# Patient Record
Sex: Female | Born: 1960 | Race: Black or African American | Hispanic: No | Smoking: Current every day smoker
Health system: Southern US, Community
[De-identification: ages and names within clinical notes are randomized; demographics above are authoritative.]

## PROBLEM LIST (undated history)

## (undated) DIAGNOSIS — M359 Systemic involvement of connective tissue, unspecified: Secondary | ICD-10-CM

## (undated) DIAGNOSIS — C801 Malignant (primary) neoplasm, unspecified: Secondary | ICD-10-CM

## (undated) DIAGNOSIS — G40909 Epilepsy, unspecified, not intractable, without status epilepticus: Secondary | ICD-10-CM

## (undated) DIAGNOSIS — I10 Essential (primary) hypertension: Secondary | ICD-10-CM

## (undated) DIAGNOSIS — M329 Systemic lupus erythematosus, unspecified: Secondary | ICD-10-CM

## (undated) DIAGNOSIS — G2581 Restless legs syndrome: Secondary | ICD-10-CM

## (undated) DIAGNOSIS — E785 Hyperlipidemia, unspecified: Secondary | ICD-10-CM

## (undated) DIAGNOSIS — C259 Malignant neoplasm of pancreas, unspecified: Secondary | ICD-10-CM

## (undated) DIAGNOSIS — G9341 Metabolic encephalopathy: Secondary | ICD-10-CM

## (undated) DIAGNOSIS — Z933 Colostomy status: Secondary | ICD-10-CM

## (undated) DIAGNOSIS — L0291 Cutaneous abscess, unspecified: Secondary | ICD-10-CM

## (undated) DIAGNOSIS — IMO0002 Reserved for concepts with insufficient information to code with codable children: Secondary | ICD-10-CM

## (undated) DIAGNOSIS — E119 Type 2 diabetes mellitus without complications: Secondary | ICD-10-CM

## (undated) HISTORY — DX: Metabolic encephalopathy: G93.41

## (undated) HISTORY — PX: OTHER SURGICAL HISTORY: SHX169

## (undated) HISTORY — PX: PANCREATECTOMY: SHX1019

## (undated) HISTORY — DX: Cutaneous abscess, unspecified: L02.91

---

## 2004-04-16 ENCOUNTER — Emergency Department: Payer: Self-pay | Admitting: Emergency Medicine

## 2004-04-18 ENCOUNTER — Emergency Department: Payer: Self-pay | Admitting: Unknown Physician Specialty

## 2004-12-18 ENCOUNTER — Emergency Department: Payer: Self-pay | Admitting: Emergency Medicine

## 2004-12-22 ENCOUNTER — Inpatient Hospital Stay: Payer: Self-pay

## 2005-01-29 ENCOUNTER — Emergency Department: Payer: Self-pay | Admitting: Emergency Medicine

## 2005-01-29 ENCOUNTER — Other Ambulatory Visit: Payer: Self-pay

## 2005-07-19 ENCOUNTER — Emergency Department: Payer: Self-pay | Admitting: Emergency Medicine

## 2005-08-03 ENCOUNTER — Ambulatory Visit: Payer: Self-pay | Admitting: Internal Medicine

## 2005-08-24 ENCOUNTER — Emergency Department: Payer: Self-pay | Admitting: Emergency Medicine

## 2005-09-04 ENCOUNTER — Ambulatory Visit: Payer: Self-pay

## 2005-11-03 ENCOUNTER — Emergency Department: Payer: Self-pay | Admitting: Emergency Medicine

## 2005-12-18 ENCOUNTER — Other Ambulatory Visit: Payer: Self-pay

## 2005-12-18 ENCOUNTER — Emergency Department: Payer: Self-pay | Admitting: Emergency Medicine

## 2005-12-23 ENCOUNTER — Emergency Department: Payer: Self-pay | Admitting: Emergency Medicine

## 2006-02-13 ENCOUNTER — Inpatient Hospital Stay: Payer: Self-pay | Admitting: Internal Medicine

## 2006-02-15 ENCOUNTER — Other Ambulatory Visit: Payer: Self-pay

## 2006-07-23 ENCOUNTER — Ambulatory Visit: Payer: Self-pay | Admitting: Internal Medicine

## 2008-07-14 ENCOUNTER — Emergency Department: Payer: Self-pay | Admitting: Emergency Medicine

## 2009-01-25 ENCOUNTER — Inpatient Hospital Stay: Payer: Self-pay | Admitting: Specialist

## 2009-04-25 ENCOUNTER — Inpatient Hospital Stay: Payer: Self-pay | Admitting: Internal Medicine

## 2009-10-13 DIAGNOSIS — Z8639 Personal history of other endocrine, nutritional and metabolic disease: Secondary | ICD-10-CM | POA: Insufficient documentation

## 2010-04-01 ENCOUNTER — Inpatient Hospital Stay: Payer: Self-pay | Admitting: *Deleted

## 2010-05-24 ENCOUNTER — Emergency Department: Payer: Self-pay | Admitting: Unknown Physician Specialty

## 2010-07-24 ENCOUNTER — Observation Stay: Payer: Self-pay | Admitting: Surgery

## 2010-08-19 DIAGNOSIS — E1159 Type 2 diabetes mellitus with other circulatory complications: Secondary | ICD-10-CM | POA: Insufficient documentation

## 2010-08-19 DIAGNOSIS — Z8719 Personal history of other diseases of the digestive system: Secondary | ICD-10-CM | POA: Insufficient documentation

## 2010-08-19 DIAGNOSIS — G2581 Restless legs syndrome: Secondary | ICD-10-CM | POA: Insufficient documentation

## 2010-08-19 DIAGNOSIS — I10 Essential (primary) hypertension: Secondary | ICD-10-CM | POA: Insufficient documentation

## 2010-08-19 DIAGNOSIS — R109 Unspecified abdominal pain: Secondary | ICD-10-CM | POA: Insufficient documentation

## 2010-08-19 DIAGNOSIS — L930 Discoid lupus erythematosus: Secondary | ICD-10-CM | POA: Insufficient documentation

## 2010-08-19 DIAGNOSIS — F339 Major depressive disorder, recurrent, unspecified: Secondary | ICD-10-CM | POA: Insufficient documentation

## 2010-08-19 DIAGNOSIS — G8929 Other chronic pain: Secondary | ICD-10-CM | POA: Insufficient documentation

## 2010-11-14 DIAGNOSIS — N611 Abscess of the breast and nipple: Secondary | ICD-10-CM | POA: Insufficient documentation

## 2011-01-01 DIAGNOSIS — E139 Other specified diabetes mellitus without complications: Secondary | ICD-10-CM | POA: Insufficient documentation

## 2011-01-01 DIAGNOSIS — Z90410 Acquired total absence of pancreas: Secondary | ICD-10-CM

## 2011-01-01 DIAGNOSIS — E891 Postprocedural hypoinsulinemia: Secondary | ICD-10-CM

## 2011-04-04 ENCOUNTER — Emergency Department: Payer: Self-pay

## 2011-04-04 LAB — COMPREHENSIVE METABOLIC PANEL
Albumin: 4.5 g/dL (ref 3.4–5.0)
Alkaline Phosphatase: 97 U/L (ref 50–136)
Calcium, Total: 10.4 mg/dL — ABNORMAL HIGH (ref 8.5–10.1)
Chloride: 99 mmol/L (ref 98–107)
Co2: 26 mmol/L (ref 21–32)
EGFR (Non-African Amer.): 51 — ABNORMAL LOW
Glucose: 284 mg/dL — ABNORMAL HIGH (ref 65–99)
Osmolality: 289 (ref 275–301)
SGOT(AST): 35 U/L (ref 15–37)
SGPT (ALT): 33 U/L
Sodium: 138 mmol/L (ref 136–145)

## 2011-04-04 LAB — CBC
HCT: 47.2 % — ABNORMAL HIGH (ref 35.0–47.0)
HGB: 15.8 g/dL (ref 12.0–16.0)
MCHC: 33.4 g/dL (ref 32.0–36.0)
RBC: 4.84 10*6/uL (ref 3.80–5.20)
WBC: 7.4 10*3/uL (ref 3.6–11.0)

## 2011-04-04 LAB — URINALYSIS, COMPLETE
Bacteria: NONE SEEN
Bilirubin,UR: NEGATIVE
Glucose,UR: 50 mg/dL (ref 0–75)
Hyaline Cast: 3
Ketone: NEGATIVE
RBC,UR: 2 /HPF (ref 0–5)
Specific Gravity: >1.06 (ref 1.003–1.030)
Squamous Epithelial: <1
WBC UR: 1 /HPF (ref 0–5)

## 2011-04-04 LAB — LIPASE, BLOOD: Lipase: 452 U/L — ABNORMAL HIGH (ref 73–393)

## 2011-04-25 DIAGNOSIS — Z9851 Tubal ligation status: Secondary | ICD-10-CM | POA: Insufficient documentation

## 2011-04-25 DIAGNOSIS — D378 Neoplasm of uncertain behavior of other specified digestive organs: Secondary | ICD-10-CM | POA: Insufficient documentation

## 2011-04-25 DIAGNOSIS — Z79899 Other long term (current) drug therapy: Secondary | ICD-10-CM | POA: Insufficient documentation

## 2011-04-25 DIAGNOSIS — D490 Neoplasm of unspecified behavior of digestive system: Secondary | ICD-10-CM | POA: Insufficient documentation

## 2011-05-01 DIAGNOSIS — F419 Anxiety disorder, unspecified: Secondary | ICD-10-CM | POA: Insufficient documentation

## 2011-08-17 ENCOUNTER — Inpatient Hospital Stay: Payer: Self-pay | Admitting: Surgery

## 2011-08-17 LAB — CBC WITH DIFFERENTIAL/PLATELET
Basophil #: 0 10*3/uL (ref 0.0–0.1)
Eosinophil %: 0.3 %
HGB: 13.9 g/dL (ref 12.0–16.0)
Lymphocyte #: 2.7 10*3/uL (ref 1.0–3.6)
MCH: 33 pg (ref 26.0–34.0)
MCHC: 33 g/dL (ref 32.0–36.0)
MCV: 100 fL (ref 80–100)
Monocyte #: 0.4 x10 3/mm (ref 0.2–0.9)
Neutrophil %: 54.3 %
Platelet: 392 10*3/uL (ref 150–440)
RBC: 4.2 10*6/uL (ref 3.80–5.20)
RDW: 14.7 % — ABNORMAL HIGH (ref 11.5–14.5)

## 2011-08-17 LAB — BASIC METABOLIC PANEL
Anion Gap: 7 (ref 7–16)
BUN: 18 mg/dL (ref 7–18)
Calcium, Total: 9.7 mg/dL (ref 8.5–10.1)
Co2: 27 mmol/L (ref 21–32)
Creatinine: 1.17 mg/dL (ref 0.60–1.30)
EGFR (African American): >60
Osmolality: 277 (ref 275–301)

## 2011-08-23 LAB — WOUND CULTURE

## 2012-01-17 ENCOUNTER — Ambulatory Visit: Payer: Self-pay | Admitting: Surgery

## 2012-04-11 DIAGNOSIS — E119 Type 2 diabetes mellitus without complications: Secondary | ICD-10-CM | POA: Insufficient documentation

## 2012-04-11 DIAGNOSIS — E1169 Type 2 diabetes mellitus with other specified complication: Secondary | ICD-10-CM | POA: Insufficient documentation

## 2012-04-28 DIAGNOSIS — D490 Neoplasm of unspecified behavior of digestive system: Secondary | ICD-10-CM | POA: Insufficient documentation

## 2012-04-28 DIAGNOSIS — F17200 Nicotine dependence, unspecified, uncomplicated: Secondary | ICD-10-CM | POA: Insufficient documentation

## 2013-01-24 ENCOUNTER — Emergency Department: Payer: Self-pay | Admitting: Internal Medicine

## 2013-08-24 DIAGNOSIS — IMO0002 Reserved for concepts with insufficient information to code with codable children: Secondary | ICD-10-CM | POA: Insufficient documentation

## 2014-06-06 NOTE — Op Note (Signed)
PATIENT NAME:  Brittany [name], Brittany [name] MR#:  [id number] DATE OF BIRTH:  1960-11-20  DATE OF PROCEDURE:  08/18/2011  PREOPERATIVE DIAGNOSIS: Right breast abscess.   POSTOPERATIVE DIAGNOSIS: Right breast abscess.   PROCEDURE: Right breast incision and drainage.   ANESTHESIA: General.   SURGEON: Rodena Goldmann, MD   OPERATIVE PROCEDURE: With the patient in the supine position after the induction of appropriate general anesthesia, the patient's right breast was and draped with sterile towels. The abscess appeared to be in the areolar complex just above the nipple itself. The area was incised and creamy white-yellow purulence was immediately evident. The area was cultured. The area was irrigated and a counterincision made. A Penrose drain was placed and secured with 4-0 nylon and sterile dressing applied. The patient was returned to the recovery room having tolerated the procedure well. Sponge, instrument, and needle counts were correct times two in the operating room.    ____________________________ Rodena Goldmann III, MD rle:bjt D: 08/18/2011 08:31:12 ET T: 08/18/2011 14:36:31 ET JOB#: [id number]  cc: Rodena Goldmann III, MD, <Dictator> Rodena Goldmann MD ELECTRONICALLY SIGNED 08/19/2011 17:29

## 2014-06-06 NOTE — Discharge Summary (Signed)
PATIENT NAME:  Brittany [name], Brittany [name] MR#:  [id number] DATE OF BIRTH:  July 31, 1960  DATE OF ADMISSION:  08/17/2011 DATE OF DISCHARGE:  08/19/2011  BRIEF HISTORY: Brittany [name] is a 54 year old woman with history of previous right breast abscess presenting to the office on the fifth with another right breast abscess. She had been evaluated several days before with breast pain but no obvious abscess at the time. Symptoms worsened over the 48 hours. She presented with a clear-cut nipple-areolar complex abscess. She could not tolerate examination or drainage in the office in an unanesthetized situation.   HOSPITAL COURSE: She was admitted the hospital, placed on IV antibiotics and taken to surgery the following morning where she underwent incision and drainage procedure under general anesthesia. The wound was drained and cultured. She was given antibiotics for 24 hours. This morning she is up, active, tolerating a diet with no complaints. Her wounds look good. There is no sign of any cellulitis or increased infection. She will be discharged home to be followed in the office in 7 to 10 days' time. Bathing, activity and driving instructions were given to patient.   DISCHARGE MEDICATIONS:  1. She is to take Percocet for pain.  2. Ambien 5 mg p.o. at bedtime p.r.n.  3. Aspirin 81 mg p.o. daily.  4. Benazepril 10 mg p.o. daily.  5. Budesonide/Formoterol 80 mcg/4.5 mcg inhaler b.i.d.  6. Creon 24,000 units b.i.d.  7. Folic acid 1 p.o. b.i.d.   8. Gabapentin 600 mg t.i.d.  9. Recombinant glucagon injection every 1 to 2 hours p.r.n.  10. Humulin 70/30 subcutaneous 18 units twice a day. 11. Hydrochlorothiazide 25 mg p.o. daily.  12. Hydroxyzine 10 mg b.i.d.   13. Meloxicam 7.5 mg p.o. daily.  14. Methotrexate 25 mg p.o. per week.  15. Metoclopramide 10 mg p.o. t.i.d. p.r.n.  16. Norvasc 5 mg p.o. daily.  17. Omeprazole 10 mg p.o. daily.  18. Promethazine 12.5 mg p.o. every 6 hours p.r.n.  19. Ropinirole 0.5 mg  p.o. at bedtime. 20. Seroquel 100 mg p.o. at bedtime. 21. Simvastatin 20 mg p.o. daily.  22. Wellbutrin XL 300 mg p.o. daily.   DISCHARGE DIAGNOSIS: Right breast abscess.   SURGERY: Incision and drainage.  ____________________________ Rodena Goldmann III, MD rle:cbb D: 08/19/2011 17:19:24 ET T: 08/21/2011 10:10:30 ET JOB#: [id number]  cc: Rodena Goldmann III, MD, <Dictator> Rodena Goldmann MD ELECTRONICALLY SIGNED 08/21/2011 16:01

## 2015-04-20 DIAGNOSIS — D229 Melanocytic nevi, unspecified: Secondary | ICD-10-CM | POA: Insufficient documentation

## 2015-04-20 DIAGNOSIS — L720 Epidermal cyst: Secondary | ICD-10-CM | POA: Insufficient documentation

## 2015-09-16 DIAGNOSIS — J449 Chronic obstructive pulmonary disease, unspecified: Secondary | ICD-10-CM | POA: Insufficient documentation

## 2015-09-16 HISTORY — DX: Chronic obstructive pulmonary disease, unspecified: J44.9

## 2016-09-06 DIAGNOSIS — F4329 Adjustment disorder with other symptoms: Secondary | ICD-10-CM | POA: Insufficient documentation

## 2016-09-06 DIAGNOSIS — F4321 Adjustment disorder with depressed mood: Secondary | ICD-10-CM | POA: Insufficient documentation

## 2016-09-06 DIAGNOSIS — Z634 Disappearance and death of family member: Secondary | ICD-10-CM

## 2016-09-06 HISTORY — DX: Adjustment disorder with depressed mood: F43.21

## 2017-10-29 ENCOUNTER — Emergency Department: Payer: Medicare Other

## 2017-10-29 ENCOUNTER — Other Ambulatory Visit: Payer: Self-pay

## 2017-10-29 ENCOUNTER — Emergency Department
Admission: EM | Admit: 2017-10-29 | Discharge: 2017-10-29 | Disposition: A | Discharge status: Home / Self Care | Payer: Medicare Other | Attending: Emergency Medicine | Admitting: Emergency Medicine

## 2017-10-29 ENCOUNTER — Encounter: Payer: Self-pay | Admitting: Emergency Medicine

## 2017-10-29 DIAGNOSIS — E119 Type 2 diabetes mellitus without complications: Secondary | ICD-10-CM | POA: Diagnosis not present

## 2017-10-29 DIAGNOSIS — D378 Neoplasm of uncertain behavior of other specified digestive organs: Secondary | ICD-10-CM | POA: Diagnosis not present

## 2017-10-29 DIAGNOSIS — R1013 Epigastric pain: Secondary | ICD-10-CM | POA: Diagnosis not present

## 2017-10-29 DIAGNOSIS — K8689 Other specified diseases of pancreas: Secondary | ICD-10-CM

## 2017-10-29 DIAGNOSIS — I10 Essential (primary) hypertension: Secondary | ICD-10-CM | POA: Diagnosis not present

## 2017-10-29 HISTORY — DX: Type 2 diabetes mellitus without complications: E11.9

## 2017-10-29 HISTORY — DX: Essential (primary) hypertension: I10

## 2017-10-29 LAB — COMPREHENSIVE METABOLIC PANEL
ALT: 17 U/L (ref 0–44)
ANION GAP: 9 (ref 5–15)
AST: 23 U/L (ref 15–41)
Albumin: 4 g/dL (ref 3.5–5.0)
Alkaline Phosphatase: 64 U/L (ref 38–126)
BUN: 14 mg/dL (ref 6–20)
CHLORIDE: 97 mmol/L — AB (ref 98–111)
CO2: 29 mmol/L (ref 22–32)
CREATININE: 0.6 mg/dL (ref 0.44–1.00)
Calcium: 9.1 mg/dL (ref 8.9–10.3)
Glucose, Bld: 149 mg/dL — ABNORMAL HIGH (ref 70–99)
POTASSIUM: 3.2 mmol/L — AB (ref 3.5–5.1)
SODIUM: 135 mmol/L (ref 135–145)
Total Bilirubin: 1.1 mg/dL (ref 0.3–1.2)
Total Protein: 8.5 g/dL — ABNORMAL HIGH (ref 6.5–8.1)

## 2017-10-29 LAB — CBC
HEMATOCRIT: 49.5 % — AB (ref 35.0–47.0)
HEMOGLOBIN: 17.2 g/dL — AB (ref 12.0–16.0)
MCH: 34.4 pg — ABNORMAL HIGH (ref 26.0–34.0)
MCHC: 34.6 g/dL (ref 32.0–36.0)
MCV: 99.4 fL (ref 80.0–100.0)
Platelets: 355 10*3/uL (ref 150–440)
RBC: 4.98 MIL/uL (ref 3.80–5.20)
RDW: 12.5 % (ref 11.5–14.5)
WBC: 7.1 10*3/uL (ref 3.6–11.0)

## 2017-10-29 LAB — URINALYSIS, COMPLETE (UACMP) WITH MICROSCOPIC
BACTERIA UA: NONE SEEN
BILIRUBIN URINE: NEGATIVE
Glucose, UA: NEGATIVE mg/dL
HGB URINE DIPSTICK: NEGATIVE
KETONES UR: NEGATIVE mg/dL
LEUKOCYTES UA: NEGATIVE
NITRITE: NEGATIVE
PROTEIN: NEGATIVE mg/dL
Specific Gravity, Urine: 1.036 — ABNORMAL HIGH (ref 1.005–1.030)
pH: 6 (ref 5.0–8.0)

## 2017-10-29 LAB — GLUCOSE, CAPILLARY: GLUCOSE-CAPILLARY: 261 mg/dL — AB (ref 70–99)

## 2017-10-29 LAB — LIPASE, BLOOD: LIPASE: 45 U/L (ref 11–51)

## 2017-10-29 MED ORDER — POTASSIUM CHLORIDE CRYS ER 20 MEQ PO TBCR
40.0000 meq | EXTENDED_RELEASE_TABLET | Freq: Once | ORAL | Status: AC
  Administered 2017-10-29: 40 meq via ORAL
  Filled 2017-10-29: qty 2

## 2017-10-29 MED ORDER — IOHEXOL 300 MG/ML  SOLN
75.0000 mL | Freq: Once | INTRAMUSCULAR | Status: AC | PRN
  Administered 2017-10-29: 75 mL via INTRAVENOUS

## 2017-10-29 MED ORDER — MORPHINE SULFATE (PF) 4 MG/ML IV SOLN
4.0000 mg | Freq: Once | INTRAVENOUS | Status: AC
  Administered 2017-10-29: 4 mg via INTRAVENOUS
  Filled 2017-10-29: qty 1

## 2017-10-29 MED ORDER — ONDANSETRON HCL 4 MG PO TABS: 4.0000 mg | ORAL_TABLET | Freq: Every day | ORAL | 1 refills | Status: DC | PRN

## 2017-10-29 MED ORDER — ONDANSETRON 4 MG PO TBDP
ORAL_TABLET | ORAL | Status: AC
  Filled 2017-10-29: qty 1

## 2017-10-29 MED ORDER — ONDANSETRON 4 MG PO TBDP
4.0000 mg | ORAL_TABLET | Freq: Once | ORAL | Status: AC
  Administered 2017-10-29: 4 mg via ORAL

## 2017-10-29 MED ORDER — IOPAMIDOL (ISOVUE-300) INJECTION 61%: 15.0000 mL | INTRAVENOUS | Status: AC

## 2017-10-29 MED ORDER — ONDANSETRON HCL 4 MG/2ML IJ SOLN
4.0000 mg | Freq: Once | INTRAMUSCULAR | Status: AC
  Administered 2017-10-29: 4 mg via INTRAVENOUS
  Filled 2017-10-29: qty 2

## 2017-10-29 MED ORDER — SODIUM CHLORIDE 0.9 % IV BOLUS
1000.0000 mL | Freq: Once | INTRAVENOUS | Status: AC
  Administered 2017-10-29: 1000 mL via INTRAVENOUS

## 2017-10-29 NOTE — ED Notes (Signed)
Per lab green top tube hemolyzed again. Redrawn by this RN. Phlebotomy to L hand. Pt tolerated well. Pt aware of POC

## 2017-10-29 NOTE — ED Notes (Signed)
Pt ambulated to and from bathroom with assist x1. Pt given popsicle per request. Pt denies further needs. Pt aware of POC. NAD.

## 2017-10-29 NOTE — Discharge Instructions (Signed)
You were seen in the emergency room for abdominal pain. It is important that you follow up closely with your primary care doctor in the next couple days, and schedule close follow-up with gastroenterology.  You may follow-up with Dr. Allen Norris or with your primary gastroenterologist at [facility].  Call tomorrow to set up close appointments as you need further evaluation.  Please return to the emergency room right away if you are to develop a fever, severe nausea, your pain becomes severe or worsens, you are unable to keep food down, begin vomiting any dark or bloody fluid, you develop any dark or bloody stools, feel dehydrated, or other new concerns or symptoms arise.

## 2017-10-29 NOTE — ED Provider Notes (Signed)
[facility] Emergency Department Provider Note   ____________________________________________   First MD Initiated Contact with Patient 10/29/17 1547     (approximate)  I have reviewed the triage vital signs and the nursing notes.   HISTORY  Chief Complaint Abdominal Pain    HPI Brittany [name] is a 57 y.o. female previous history of pancreatectomy, alcohol abuse currently in remission, diabetes.  Patient reports for about 2 to 3 days she is having lots of nausea and vomiting with upper abdominal pain.  She has not had any bowel movements.  Reports of severe pain in the mid upper abdomen.  No fevers or chills.  No pain or burning with urination.  No chest pain or trouble breathing.  Pain is severe 10 out of 10, fairly persistent but much worse if she attempts to move or eat anything.  Feels dehydrated.  Past Medical History:  Diagnosis Date  . Diabetes mellitus without complication ([place])   . Hypertension     There are no active problems to display for this patient.   Past Surgical History:  Procedure Laterality Date  . PANCREATECTOMY      Prior to Admission medications   Medication Sig Start Date End Date Taking? Authorizing Provider  ondansetron (ZOFRAN) 4 MG tablet Take 1 tablet (4 mg total) by mouth daily as needed for nausea or vomiting. 10/29/17 10/29/18  Delman Kitten, MD    Allergies Cephalosporins; Hydromorphone; and Keflin [cephalothin]  No family history on file.  Social History Social History   Tobacco Use  . Smoking status: Never Smoker  . Smokeless tobacco: Never Used  Substance Use Topics  . Alcohol use: Not Currently  . Drug use: Not Currently    Review of Systems Constitutional: No fever/chills but feels fatigued and lightheaded Eyes: No visual changes. ENT: No sore throat. Cardiovascular: Denies chest pain. Respiratory: Denies shortness of breath. Gastrointestinal:  No diarrhea.  No constipation. Genitourinary:  Negative for dysuria. Musculoskeletal: Negative for back pain. Skin: Negative for rash. Neurological: Negative for headaches, focal weakness or numbness.    ____________________________________________   PHYSICAL EXAM:  VITAL SIGNS: ED Triage Vitals  Enc Vitals Group     BP 10/29/17 1415 (!) 136/96     Pulse Rate 10/29/17 1415 74     Resp --      Temp 10/29/17 1415 98.2 F (36.8 C)     Temp Source 10/29/17 1415 Oral     SpO2 10/29/17 1415 100 %     Weight 10/29/17 1415 110 lb (49.9 kg)     Height 10/29/17 1415 5\' 8"  (1.727 m)     Head Circumference --      Peak Flow --      Pain Score 10/29/17 1427 10     Pain Loc --      Pain Edu? --      Excl. in [place]? --     Constitutional: Alert and oriented.  Appears in pain, hand over her upper abdomen.  He appears in moderate to severe discomfort but no respiratory or acute cardiac distress. Eyes: Conjunctivae are normal. Head: Atraumatic. Nose: No congestion/rhinnorhea. Mouth/Throat: Mucous membranes are dry. Neck: No stridor.  Cardiovascular: Normal rate, regular rhythm. Grossly normal heart sounds.  Good peripheral circulation. Respiratory: Normal respiratory effort.  No retractions. Lungs CTAB. Gastrointestinal: Quizzically tender throughout the upper abdomen slightly more in the epigastrium versus the sides.  Minimal discomfort in the lower abdomen.  Some pain to rebound epigastrium.  No distention.  Musculoskeletal: No lower extremity tenderness nor edema. Neurologic:  Normal speech and language. No gross focal neurologic deficits are appreciated.  Skin:  Skin is warm, dry and intact. No rash noted. Psychiatric: Mood and affect are normal. Speech and behavior are normal.  ____________________________________________   LABS (all labs ordered are listed, but only abnormal results are displayed)  Labs Reviewed  CBC - Abnormal; Notable for the following components:      Result Value   Hemoglobin 17.2 (*)    HCT 49.5 (*)     MCH 34.4 (*)    All other components within normal limits  URINALYSIS, COMPLETE (UACMP) WITH MICROSCOPIC - Abnormal; Notable for the following components:   Color, Urine YELLOW (*)    APPearance CLEAR (*)    Specific Gravity, Urine 1.036 (*)    All other components within normal limits  GLUCOSE, CAPILLARY - Abnormal; Notable for the following components:   Glucose-Capillary 261 (*)    All other components within normal limits  COMPREHENSIVE METABOLIC PANEL - Abnormal; Notable for the following components:   Potassium 3.2 (*)    Chloride 97 (*)    Glucose, Bld 149 (*)    Total Protein 8.5 (*)    All other components within normal limits  LIPASE, BLOOD   ____________________________________________  EKG  Reviewed and entered by me at 1600 Heart rate 69 QRS 95 QTc 430 Normal sinus rhythm, no evidence of acute ischemia.  No ST elevation ____________________________________________  RADIOLOGY  Ct Abdomen Pelvis W Contrast  IMPRESSION: 1. New low-attenuation 2.2 cm posterior pancreatic head lesion with increased dilatation of the pancreatic duct in the remnant pancreatic head. Prior resection of the pancreatic body and tail. Pancreatic head neoplasm not excluded. Further evaluation with MRI abdomen with MRCP without and with IV contrast recommended. 2. No biliary ductal dilatation. 3. No evidence of bowel obstruction or acute bowel inflammation. Normal appendix. Mild left colonic diverticulosis, with no evidence of acute diverticulitis. 4. Stable left adrenal adenoma. 5.  Aortic Atherosclerosis (ICD10-I70.0). Electronically Signed   By: Ilona Sorrel M.D.   On: 10/29/2017 19:12    Clinical presentation CT scan labs discussed with Dr. Marius Ditch, she recommends close follow-up with gastroenterology and Dr. Allen Norris, advises no indication for admission if the patient's pain can be controlled and able to take by mouth. ____________________________________________   PROCEDURES  Procedure(s)  performed: None  Procedures  Critical Care performed: No  ____________________________________________   INITIAL IMPRESSION / ASSESSMENT AND PLAN / ED COURSE  Pertinent labs & imaging results that were available during my care of the patient were reviewed by me and considered in my medical decision making (see chart for details).  Differential diagnosis includes but is not limited to, abdominal perforation, aortic dissection, cholecystitis, appendicitis, diverticulitis, colitis, esophagitis/gastritis, kidney stone, pyelonephritis, urinary tract infection, aortic aneurysm. All are considered in decision and treatment plan. Based upon the patient's presentation and risk factors, concern for patient's notable upper abdominal pain.  Send lab work, provide morphine Zofran and obtain CT scan to further evaluate.   ----------------------------------------- 8:01 PM on 10/29/2017 -----------------------------------------  Patient reassessed, ongoing notable epigastric tenderness.  Given additional morphine at this time as her pain is returning.  Appears to be in moderate to severe pain.  CT scan reviewed and discussed with Dr. Derl Barrow who recommends that patient will need close GI evaluation, consider admission and evaluation with Dr. Allen Norris tomorrow.      ----------------------------------------- 10:30 PM on 10/29/2017 -----------------------------------------  Patient feels much better.  Resting comfortably.  Unable to tolerate food by mouth now without ongoing pain.  On reexam resting comfortably, reports pain is subsided.  Discussed CT findings and need for close follow-up with gastroenterology, patient is in agreement with this plan and will follow up with gastroenterologist and primary care doctor.  Not driving himself home.  Return precautions and treatment recommendations and follow-up discussed with the patient who is agreeable with the plan.       ____________________________________________   FINAL CLINICAL IMPRESSION(S) / ED DIAGNOSES  Final diagnoses:  Epigastric abdominal pain  Pancreatic mass      NEW MEDICATIONS STARTED DURING THIS VISIT:  New Prescriptions   ONDANSETRON (ZOFRAN) 4 MG TABLET    Take 1 tablet (4 mg total) by mouth daily as needed for nausea or vomiting.     Note:  This document was prepared using Dragon voice recognition software and may include unintentional dictation errors.     Delman Kitten, MD 10/29/17 2232

## 2017-10-29 NOTE — ED Notes (Signed)
CT notified that pt has finished drinking oral contrast

## 2017-10-29 NOTE — ED Notes (Signed)
Quale MD to bedside with update

## 2017-10-29 NOTE — ED Triage Notes (Signed)
Pt to ED via POV with c/o abd pain and emesis x3 days. Pt appears fatigue, VSS.

## 2017-10-29 NOTE — ED Notes (Signed)
Pt stuck X 3 for PIV for 3 different RN with no success. Blood obtained.

## 2017-10-29 NOTE — ED Notes (Signed)
Quale MD at bedside

## 2017-10-29 NOTE — ED Notes (Signed)
XR at bedside

## 2017-11-04 ENCOUNTER — Other Ambulatory Visit: Payer: Self-pay

## 2017-11-27 ENCOUNTER — Emergency Department: Payer: Medicare Other

## 2017-11-27 ENCOUNTER — Emergency Department
Admission: EM | Admit: 2017-11-27 | Discharge: 2017-11-27 | Disposition: A | Discharge status: Home / Self Care | Payer: Medicare Other | Attending: Emergency Medicine | Admitting: Emergency Medicine

## 2017-11-27 ENCOUNTER — Encounter: Payer: Self-pay | Admitting: Emergency Medicine

## 2017-11-27 ENCOUNTER — Other Ambulatory Visit: Payer: Self-pay

## 2017-11-27 DIAGNOSIS — E119 Type 2 diabetes mellitus without complications: Secondary | ICD-10-CM | POA: Insufficient documentation

## 2017-11-27 DIAGNOSIS — I10 Essential (primary) hypertension: Secondary | ICD-10-CM | POA: Insufficient documentation

## 2017-11-27 DIAGNOSIS — R1084 Generalized abdominal pain: Secondary | ICD-10-CM | POA: Diagnosis not present

## 2017-11-27 DIAGNOSIS — Z79899 Other long term (current) drug therapy: Secondary | ICD-10-CM | POA: Insufficient documentation

## 2017-11-27 DIAGNOSIS — R110 Nausea: Secondary | ICD-10-CM

## 2017-11-27 DIAGNOSIS — Z794 Long term (current) use of insulin: Secondary | ICD-10-CM | POA: Insufficient documentation

## 2017-11-27 DIAGNOSIS — R109 Unspecified abdominal pain: Secondary | ICD-10-CM

## 2017-11-27 LAB — CBC
HCT: 49.4 % — ABNORMAL HIGH (ref 36.0–46.0)
HEMOGLOBIN: 16.5 g/dL — AB (ref 12.0–15.0)
MCH: 31.8 pg (ref 26.0–34.0)
MCHC: 33.4 g/dL (ref 30.0–36.0)
MCV: 95.2 fL (ref 80.0–100.0)
NRBC: 0 % (ref 0.0–0.2)
Platelets: 397 10*3/uL (ref 150–400)
RBC: 5.19 MIL/uL — AB (ref 3.87–5.11)
RDW: 12.1 % (ref 11.5–15.5)
WBC: 6 10*3/uL (ref 4.0–10.5)

## 2017-11-27 LAB — URINALYSIS, COMPLETE (UACMP) WITH MICROSCOPIC
Bacteria, UA: NONE SEEN
Bilirubin Urine: NEGATIVE
GLUCOSE, UA: 150 mg/dL — AB
HGB URINE DIPSTICK: NEGATIVE
Ketones, ur: 20 mg/dL — AB
LEUKOCYTES UA: NEGATIVE
NITRITE: NEGATIVE
PROTEIN: 30 mg/dL — AB
Specific Gravity, Urine: >1.046 — ABNORMAL HIGH (ref 1.005–1.030)
pH: 5 (ref 5.0–8.0)

## 2017-11-27 LAB — COMPREHENSIVE METABOLIC PANEL
ALBUMIN: 4.8 g/dL (ref 3.5–5.0)
ALT: 15 U/L (ref 0–44)
ANION GAP: 16 — AB (ref 5–15)
AST: 23 U/L (ref 15–41)
Alkaline Phosphatase: 82 U/L (ref 38–126)
BUN: 13 mg/dL (ref 6–20)
CO2: 26 mmol/L (ref 22–32)
Calcium: 9.8 mg/dL (ref 8.9–10.3)
Chloride: 96 mmol/L — ABNORMAL LOW (ref 98–111)
Creatinine, Ser: 0.62 mg/dL (ref 0.44–1.00)
GFR calc non Af Amer: >60 mL/min (ref >60)
Glucose, Bld: 234 mg/dL — ABNORMAL HIGH (ref 70–99)
Potassium: 3.3 mmol/L — ABNORMAL LOW (ref 3.5–5.1)
SODIUM: 138 mmol/L (ref 135–145)
TOTAL PROTEIN: 9.8 g/dL — AB (ref 6.5–8.1)
Total Bilirubin: 1.1 mg/dL (ref 0.3–1.2)

## 2017-11-27 LAB — LIPASE, BLOOD: LIPASE: 67 U/L — AB (ref 11–51)

## 2017-11-27 LAB — GLUCOSE, CAPILLARY: Glucose-Capillary: 173 mg/dL — ABNORMAL HIGH (ref 70–99)

## 2017-11-27 MED ORDER — MORPHINE SULFATE (PF) 2 MG/ML IV SOLN
2.0000 mg | Freq: Once | INTRAVENOUS | Status: AC
  Administered 2017-11-27: 2 mg via INTRAVENOUS
  Filled 2017-11-27: qty 1

## 2017-11-27 MED ORDER — MORPHINE SULFATE (PF) 2 MG/ML IV SOLN: 2.0000 mg | Freq: Once | INTRAVENOUS | Status: DC

## 2017-11-27 MED ORDER — ONDANSETRON HCL 4 MG/2ML IJ SOLN
4.0000 mg | Freq: Once | INTRAMUSCULAR | Status: AC
  Administered 2017-11-27: 4 mg via INTRAVENOUS
  Filled 2017-11-27: qty 2

## 2017-11-27 MED ORDER — SODIUM CHLORIDE 0.9 % IV BOLUS
1000.0000 mL | Freq: Once | INTRAVENOUS | Status: AC
  Administered 2017-11-27: 1000 mL via INTRAVENOUS

## 2017-11-27 MED ORDER — ONDANSETRON HCL 4 MG PO TABS: 4.0000 mg | ORAL_TABLET | Freq: Every day | ORAL | 0 refills | Status: DC | PRN

## 2017-11-27 MED ORDER — IOHEXOL 300 MG/ML  SOLN
75.0000 mL | Freq: Once | INTRAMUSCULAR | Status: AC | PRN
  Administered 2017-11-27: 75 mL via INTRAVENOUS

## 2017-11-27 NOTE — ED Triage Notes (Signed)
Pt reports that she is having abd pain and emesis. She reports that she was seen her for the same about a month ago. She reports that they found out she had a cyst on her pancrease.
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
## 2017-11-27 NOTE — Discharge Instructions (Signed)

## 2018-05-12 ENCOUNTER — Emergency Department: Payer: Medicare Other

## 2018-05-12 ENCOUNTER — Other Ambulatory Visit: Payer: Self-pay

## 2018-05-12 ENCOUNTER — Emergency Department
Admission: EM | Admit: 2018-05-12 | Discharge: 2018-05-12 | Disposition: A | Discharge status: Home / Self Care | Payer: Medicare Other | Attending: Emergency Medicine | Admitting: Emergency Medicine

## 2018-05-12 DIAGNOSIS — R1084 Generalized abdominal pain: Secondary | ICD-10-CM | POA: Insufficient documentation

## 2018-05-12 DIAGNOSIS — J449 Chronic obstructive pulmonary disease, unspecified: Secondary | ICD-10-CM | POA: Diagnosis not present

## 2018-05-12 DIAGNOSIS — R112 Nausea with vomiting, unspecified: Secondary | ICD-10-CM | POA: Diagnosis not present

## 2018-05-12 DIAGNOSIS — Z7984 Long term (current) use of oral hypoglycemic drugs: Secondary | ICD-10-CM | POA: Insufficient documentation

## 2018-05-12 DIAGNOSIS — E119 Type 2 diabetes mellitus without complications: Secondary | ICD-10-CM | POA: Diagnosis not present

## 2018-05-12 DIAGNOSIS — Z79899 Other long term (current) drug therapy: Secondary | ICD-10-CM | POA: Insufficient documentation

## 2018-05-12 DIAGNOSIS — I10 Essential (primary) hypertension: Secondary | ICD-10-CM | POA: Insufficient documentation

## 2018-05-12 HISTORY — DX: Systemic involvement of connective tissue, unspecified: M35.9

## 2018-05-12 HISTORY — DX: Reserved for concepts with insufficient information to code with codable children: IMO0002

## 2018-05-12 HISTORY — DX: Systemic lupus erythematosus, unspecified: M32.9

## 2018-05-12 LAB — CBC WITH DIFFERENTIAL/PLATELET
ABS IMMATURE GRANULOCYTES: 0.03 10*3/uL (ref 0.00–0.07)
Basophils Absolute: 0 10*3/uL (ref 0.0–0.1)
Basophils Relative: 0 %
EOS ABS: 0 10*3/uL (ref 0.0–0.5)
EOS PCT: 0 %
HCT: 48 % — ABNORMAL HIGH (ref 36.0–46.0)
Hemoglobin: 16.3 g/dL — ABNORMAL HIGH (ref 12.0–15.0)
Immature Granulocytes: 1 %
Lymphocytes Relative: 33 %
Lymphs Abs: 2.1 10*3/uL (ref 0.7–4.0)
MCH: 32 pg (ref 26.0–34.0)
MCHC: 34 g/dL (ref 30.0–36.0)
MCV: 94.3 fL (ref 80.0–100.0)
MONO ABS: 0.5 10*3/uL (ref 0.1–1.0)
MONOS PCT: 7 %
NEUTROS ABS: 3.6 10*3/uL (ref 1.7–7.7)
Neutrophils Relative %: 59 %
PLATELETS: 405 10*3/uL — AB (ref 150–400)
RBC: 5.09 MIL/uL (ref 3.87–5.11)
RDW: 12 % (ref 11.5–15.5)
WBC: 6.2 10*3/uL (ref 4.0–10.5)
nRBC: 0 % (ref 0.0–0.2)

## 2018-05-12 LAB — URINALYSIS, COMPLETE (UACMP) WITH MICROSCOPIC
BACTERIA UA: NONE SEEN
BILIRUBIN URINE: NEGATIVE
HGB URINE DIPSTICK: NEGATIVE
KETONES UR: 20 mg/dL — AB
LEUKOCYTE UA: NEGATIVE
NITRITE: NEGATIVE
PROTEIN: 30 mg/dL — AB
Specific Gravity, Urine: >1.046 — ABNORMAL HIGH (ref 1.005–1.030)
pH: 6 (ref 5.0–8.0)

## 2018-05-12 LAB — GLUCOSE, CAPILLARY
GLUCOSE-CAPILLARY: 182 mg/dL — AB (ref 70–99)
GLUCOSE-CAPILLARY: 187 mg/dL — AB (ref 70–99)
Glucose-Capillary: 345 mg/dL — ABNORMAL HIGH (ref 70–99)
Glucose-Capillary: 312 mg/dL — ABNORMAL HIGH (ref 70–99)
Glucose-Capillary: 196 mg/dL — ABNORMAL HIGH (ref 70–99)

## 2018-05-12 LAB — BLOOD GAS, VENOUS
Acid-Base Excess: 4.8 mmol/L — ABNORMAL HIGH (ref 0.0–2.0)
Bicarbonate: 30.4 mmol/L — ABNORMAL HIGH (ref 20.0–28.0)
O2 SAT: 74.2 %
PCO2 VEN: 48 mmHg (ref 44.0–60.0)
PO2 VEN: 39 mmHg (ref 32.0–45.0)
Patient temperature: 37
pH, Ven: 7.41 (ref 7.250–7.430)

## 2018-05-12 LAB — COMPREHENSIVE METABOLIC PANEL
ALT: 14 U/L (ref 0–44)
AST: 21 U/L (ref 15–41)
Albumin: 4.1 g/dL (ref 3.5–5.0)
Alkaline Phosphatase: 71 U/L (ref 38–126)
Anion gap: 13 (ref 5–15)
BUN: 17 mg/dL (ref 6–20)
CHLORIDE: 92 mmol/L — AB (ref 98–111)
CO2: 26 mmol/L (ref 22–32)
CREATININE: 0.74 mg/dL (ref 0.44–1.00)
Calcium: 9.4 mg/dL (ref 8.9–10.3)
GFR calc Af Amer: >60 mL/min (ref >60)
GFR calc non Af Amer: >60 mL/min (ref >60)
Glucose, Bld: 378 mg/dL — ABNORMAL HIGH (ref 70–99)
POTASSIUM: 4 mmol/L (ref 3.5–5.1)
SODIUM: 131 mmol/L — AB (ref 135–145)
Total Bilirubin: 1.5 mg/dL — ABNORMAL HIGH (ref 0.3–1.2)
Total Protein: 9.3 g/dL — ABNORMAL HIGH (ref 6.5–8.1)

## 2018-05-12 LAB — LACTIC ACID, PLASMA
LACTIC ACID, VENOUS: 2.8 mmol/L — AB (ref 0.5–1.9)
Lactic Acid, Venous: 2.1 mmol/L (ref 0.5–1.9)

## 2018-05-12 LAB — LIPASE, BLOOD: LIPASE: 48 U/L (ref 11–51)

## 2018-05-12 MED ORDER — ONDANSETRON HCL 4 MG/2ML IJ SOLN
4.0000 mg | Freq: Once | INTRAMUSCULAR | Status: AC
  Administered 2018-05-12: 4 mg via INTRAVENOUS
  Filled 2018-05-12: qty 2

## 2018-05-12 MED ORDER — SODIUM CHLORIDE 0.9 % IV BOLUS
1000.0000 mL | Freq: Once | INTRAVENOUS | Status: AC
  Administered 2018-05-12: 1000 mL via INTRAVENOUS

## 2018-05-12 MED ORDER — MORPHINE SULFATE (PF) 4 MG/ML IV SOLN
4.0000 mg | Freq: Once | INTRAVENOUS | Status: AC
  Administered 2018-05-12: 4 mg via INTRAVENOUS
  Filled 2018-05-12: qty 1

## 2018-05-12 MED ORDER — ONDANSETRON 4 MG PO TBDP: 4.0000 mg | ORAL_TABLET | Freq: Four times a day (QID) | ORAL | 0 refills | Status: DC | PRN

## 2018-05-12 MED ORDER — IOHEXOL 300 MG/ML  SOLN
75.0000 mL | Freq: Once | INTRAMUSCULAR | Status: AC | PRN
  Administered 2018-05-12: 75 mL via INTRAVENOUS

## 2018-05-12 MED ORDER — INSULIN ASPART 100 UNIT/ML ~~LOC~~ SOLN
6.0000 [IU] | Freq: Once | SUBCUTANEOUS | Status: AC
  Administered 2018-05-12: 6 [IU] via INTRAVENOUS
  Filled 2018-05-12: qty 1

## 2018-05-12 NOTE — ED Notes (Signed)
Pt has not taken her normal medications in the past 2 days

## 2018-05-12 NOTE — ED Notes (Signed)
Lab called to inform that lactic had hemolysed- will redraw

## 2018-05-12 NOTE — ED Notes (Signed)
Pt is set for discharge but unable to leave until 2nd lactic acid results per Dr Jacqualine Code

## 2018-05-12 NOTE — ED Notes (Signed)
Pt given a phone to call her daughter to pick her up

## 2018-05-12 NOTE — ED Notes (Signed)
Dr Jacqualine Code notified of lactic 2.8- verbal orders given
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
## 2018-05-12 NOTE — ED Notes (Signed)
Dr Jacqualine Code notified of lactic 2.1 and he stated she could be discharged

## 2018-05-12 NOTE — ED Notes (Signed)
Pt ate one pack of graham crackers and drank a half of ginger ale- no n/v noted

## 2018-05-12 NOTE — ED Notes (Signed)
Patient transported to CT 

## 2018-05-12 NOTE — ED Triage Notes (Signed)
Pt has had n/v/d since Thursday night with none today, has not slept since Friday and has trouble keeping anything on her stomach- reports having back and abdominal pain and a headache- reports headache 2-3x a week normally

## 2018-05-12 NOTE — ED Notes (Signed)
Pt given ginger ale and graham crackers.

## 2018-05-12 NOTE — Discharge Instructions (Signed)
? ?  Please return to the emergency room right away if you are to develop a fever, severe nausea, your pain becomes severe or worsens, you are unable to keep food down, begin vomiting any dark or bloody fluid, you develop any dark or bloody stools, feel dehydrated, or other new concerns or symptoms arise. ? ?

## 2018-10-17 ENCOUNTER — Inpatient Hospital Stay
Admission: EM | Admit: 2018-10-17 | Discharge: 2018-10-19 | DRG: 638 | Disposition: A | Discharge status: Home / Self Care | Payer: Medicare Other | Attending: Internal Medicine | Admitting: Internal Medicine

## 2018-10-17 ENCOUNTER — Other Ambulatory Visit: Payer: Self-pay

## 2018-10-17 ENCOUNTER — Encounter: Payer: Self-pay | Admitting: Emergency Medicine

## 2018-10-17 DIAGNOSIS — I10 Essential (primary) hypertension: Secondary | ICD-10-CM | POA: Diagnosis present

## 2018-10-17 DIAGNOSIS — Z20828 Contact with and (suspected) exposure to other viral communicable diseases: Secondary | ICD-10-CM | POA: Diagnosis present

## 2018-10-17 DIAGNOSIS — G2581 Restless legs syndrome: Secondary | ICD-10-CM | POA: Diagnosis present

## 2018-10-17 DIAGNOSIS — E861 Hypovolemia: Secondary | ICD-10-CM | POA: Diagnosis present

## 2018-10-17 DIAGNOSIS — E1110 Type 2 diabetes mellitus with ketoacidosis without coma: Principal | ICD-10-CM | POA: Diagnosis present

## 2018-10-17 DIAGNOSIS — Z9081 Acquired absence of spleen: Secondary | ICD-10-CM | POA: Diagnosis not present

## 2018-10-17 DIAGNOSIS — E872 Acidosis: Secondary | ICD-10-CM | POA: Diagnosis present

## 2018-10-17 DIAGNOSIS — E871 Hypo-osmolality and hyponatremia: Secondary | ICD-10-CM | POA: Diagnosis present

## 2018-10-17 DIAGNOSIS — M329 Systemic lupus erythematosus, unspecified: Secondary | ICD-10-CM | POA: Diagnosis present

## 2018-10-17 DIAGNOSIS — Z794 Long term (current) use of insulin: Secondary | ICD-10-CM | POA: Diagnosis not present

## 2018-10-17 DIAGNOSIS — F17200 Nicotine dependence, unspecified, uncomplicated: Secondary | ICD-10-CM | POA: Diagnosis present

## 2018-10-17 DIAGNOSIS — Z881 Allergy status to other antibiotic agents status: Secondary | ICD-10-CM | POA: Diagnosis not present

## 2018-10-17 DIAGNOSIS — E785 Hyperlipidemia, unspecified: Secondary | ICD-10-CM | POA: Diagnosis present

## 2018-10-17 DIAGNOSIS — N390 Urinary tract infection, site not specified: Secondary | ICD-10-CM | POA: Diagnosis present

## 2018-10-17 DIAGNOSIS — Z791 Long term (current) use of non-steroidal anti-inflammatories (NSAID): Secondary | ICD-10-CM

## 2018-10-17 DIAGNOSIS — F1010 Alcohol abuse, uncomplicated: Secondary | ICD-10-CM | POA: Diagnosis present

## 2018-10-17 DIAGNOSIS — Z885 Allergy status to narcotic agent status: Secondary | ICD-10-CM

## 2018-10-17 DIAGNOSIS — J449 Chronic obstructive pulmonary disease, unspecified: Secondary | ICD-10-CM | POA: Diagnosis present

## 2018-10-17 DIAGNOSIS — B9689 Other specified bacterial agents as the cause of diseases classified elsewhere: Secondary | ICD-10-CM | POA: Diagnosis present

## 2018-10-17 DIAGNOSIS — E1310 Other specified diabetes mellitus with ketoacidosis without coma: Secondary | ICD-10-CM

## 2018-10-17 DIAGNOSIS — Z79899 Other long term (current) drug therapy: Secondary | ICD-10-CM | POA: Diagnosis not present

## 2018-10-17 DIAGNOSIS — E8729 Other acidosis: Secondary | ICD-10-CM

## 2018-10-17 DIAGNOSIS — E860 Dehydration: Secondary | ICD-10-CM | POA: Diagnosis present

## 2018-10-17 DIAGNOSIS — Z716 Tobacco abuse counseling: Secondary | ICD-10-CM

## 2018-10-17 DIAGNOSIS — Z90410 Acquired total absence of pancreas: Secondary | ICD-10-CM

## 2018-10-17 DIAGNOSIS — F329 Major depressive disorder, single episode, unspecified: Secondary | ICD-10-CM | POA: Diagnosis present

## 2018-10-17 LAB — COMPREHENSIVE METABOLIC PANEL
ALT: 14 U/L (ref 0–44)
AST: 19 U/L (ref 15–41)
Albumin: 4.2 g/dL (ref 3.5–5.0)
Alkaline Phosphatase: 83 U/L (ref 38–126)
Anion gap: 20 — ABNORMAL HIGH (ref 5–15)
BUN: 23 mg/dL — ABNORMAL HIGH (ref 6–20)
CO2: 20 mmol/L — ABNORMAL LOW (ref 22–32)
Calcium: 9.7 mg/dL (ref 8.9–10.3)
Chloride: 88 mmol/L — ABNORMAL LOW (ref 98–111)
Creatinine, Ser: 0.68 mg/dL (ref 0.44–1.00)
GFR calc Af Amer: >60 mL/min (ref >60)
GFR calc non Af Amer: >60 mL/min (ref >60)
Glucose, Bld: 371 mg/dL — ABNORMAL HIGH (ref 70–99)
Potassium: 4.4 mmol/L (ref 3.5–5.1)
Sodium: 128 mmol/L — ABNORMAL LOW (ref 135–145)
Total Bilirubin: 1.5 mg/dL — ABNORMAL HIGH (ref 0.3–1.2)
Total Protein: 8.7 g/dL — ABNORMAL HIGH (ref 6.5–8.1)

## 2018-10-17 LAB — BLOOD GAS, VENOUS
Acid-Base Excess: 6.4 mmol/L — ABNORMAL HIGH (ref 0.0–2.0)
Bicarbonate: 31.9 mmol/L — ABNORMAL HIGH (ref 20.0–28.0)
O2 Saturation: 20.7 %
Patient temperature: 37
pCO2, Ven: 48 mmHg (ref 44.0–60.0)
pH, Ven: 7.43 (ref 7.250–7.430)

## 2018-10-17 LAB — GLUCOSE, CAPILLARY
Glucose-Capillary: 338 mg/dL — ABNORMAL HIGH (ref 70–99)
Glucose-Capillary: 243 mg/dL — ABNORMAL HIGH (ref 70–99)
Glucose-Capillary: 185 mg/dL — ABNORMAL HIGH (ref 70–99)

## 2018-10-17 LAB — URINALYSIS, COMPLETE (UACMP) WITH MICROSCOPIC
Bilirubin Urine: NEGATIVE
Glucose, UA: >=500 mg/dL — AB
Ketones, ur: 80 mg/dL — AB
Leukocytes,Ua: NEGATIVE
Nitrite: POSITIVE — AB
Protein, ur: 30 mg/dL — AB
Specific Gravity, Urine: 1.031 — ABNORMAL HIGH (ref 1.005–1.030)
pH: 5 (ref 5.0–8.0)

## 2018-10-17 LAB — URINE DRUG SCREEN, QUALITATIVE (ARMC ONLY)
Amphetamines, Ur Screen: NOT DETECTED
Barbiturates, Ur Screen: NOT DETECTED
Benzodiazepine, Ur Scrn: NOT DETECTED
Cannabinoid 50 Ng, Ur ~~LOC~~: POSITIVE — AB
Cocaine Metabolite,Ur ~~LOC~~: NOT DETECTED
MDMA (Ecstasy)Ur Screen: NOT DETECTED
Methadone Scn, Ur: NOT DETECTED
Opiate, Ur Screen: NOT DETECTED
Phencyclidine (PCP) Ur S: NOT DETECTED
Tricyclic, Ur Screen: NOT DETECTED

## 2018-10-17 LAB — SARS CORONAVIRUS 2 BY RT PCR (HOSPITAL ORDER, PERFORMED IN ~~LOC~~ HOSPITAL LAB): SARS Coronavirus 2: NEGATIVE

## 2018-10-17 LAB — CBC
HCT: 47.2 % — ABNORMAL HIGH (ref 36.0–46.0)
Hemoglobin: 16.4 g/dL — ABNORMAL HIGH (ref 12.0–15.0)
MCH: 31.8 pg (ref 26.0–34.0)
MCHC: 34.7 g/dL (ref 30.0–36.0)
MCV: 91.7 fL (ref 80.0–100.0)
Platelets: 344 10*3/uL (ref 150–400)
RBC: 5.15 MIL/uL — ABNORMAL HIGH (ref 3.87–5.11)
RDW: 12.1 % (ref 11.5–15.5)
WBC: 7.1 10*3/uL (ref 4.0–10.5)
nRBC: 0 % (ref 0.0–0.2)

## 2018-10-17 LAB — LIPASE, BLOOD: Lipase: 33 U/L (ref 11–51)

## 2018-10-17 LAB — HEMOGLOBIN A1C
Hgb A1c MFr Bld: 11.7 % — ABNORMAL HIGH (ref 4.8–5.6)
Mean Plasma Glucose: 289.09 mg/dL

## 2018-10-17 MED ORDER — ATORVASTATIN CALCIUM 20 MG PO TABS
40.0000 mg | ORAL_TABLET | Freq: Every day | ORAL | Status: DC
  Administered 2018-10-18: 17:00:00 40 mg via ORAL
  Filled 2018-10-17: qty 2

## 2018-10-17 MED ORDER — HYDROXYCHLOROQUINE SULFATE 200 MG PO TABS
200.0000 mg | ORAL_TABLET | Freq: Every day | ORAL | Status: DC
  Administered 2018-10-18 – 2018-10-19 (×2): 400 mg via ORAL
  Filled 2018-10-17 (×2): qty 2

## 2018-10-17 MED ORDER — SODIUM CHLORIDE 0.9 % IV SOLN
1.0000 g | INTRAVENOUS | Status: DC
  Administered 2018-10-18 (×2): 1 g via INTRAVENOUS
  Filled 2018-10-17: qty 1
  Filled 2018-10-17: qty 10

## 2018-10-17 MED ORDER — ENOXAPARIN SODIUM 40 MG/0.4ML ~~LOC~~ SOLN
40.0000 mg | SUBCUTANEOUS | Status: DC
  Administered 2018-10-18 (×2): 40 mg via SUBCUTANEOUS
  Filled 2018-10-17 (×2): qty 0.4

## 2018-10-17 MED ORDER — BUPROPION HCL ER (XL) 150 MG PO TB24
300.0000 mg | ORAL_TABLET | Freq: Every day | ORAL | Status: DC
  Administered 2018-10-18 – 2018-10-19 (×2): 300 mg via ORAL
  Filled 2018-10-17: qty 1
  Filled 2018-10-17 (×2): qty 2

## 2018-10-17 MED ORDER — MORPHINE SULFATE (PF) 2 MG/ML IV SOLN
2.0000 mg | INTRAVENOUS | Status: DC | PRN
  Administered 2018-10-17: 2 mg via INTRAVENOUS
  Filled 2018-10-17: qty 1

## 2018-10-17 MED ORDER — SODIUM CHLORIDE 0.9 % IV SOLN: INTRAVENOUS | Status: DC

## 2018-10-17 MED ORDER — MIRTAZAPINE 15 MG PO TABS
15.0000 mg | ORAL_TABLET | Freq: Every day | ORAL | Status: DC
  Administered 2018-10-18 (×2): 15 mg via ORAL
  Filled 2018-10-17 (×2): qty 1

## 2018-10-17 MED ORDER — ONDANSETRON 4 MG PO TBDP
4.0000 mg | ORAL_TABLET | Freq: Once | ORAL | Status: AC | PRN
  Administered 2018-10-17: 4 mg via ORAL

## 2018-10-17 MED ORDER — POTASSIUM CHLORIDE 10 MEQ/100ML IV SOLN
10.0000 meq | INTRAVENOUS | Status: AC
  Administered 2018-10-17 (×2): 10 meq via INTRAVENOUS
  Filled 2018-10-17 (×2): qty 100

## 2018-10-17 MED ORDER — INSULIN REGULAR(HUMAN) IN NACL 100-0.9 UT/100ML-% IV SOLN
INTRAVENOUS | Status: DC
  Administered 2018-10-17: 2.8 [IU]/h via INTRAVENOUS
  Filled 2018-10-17: qty 100

## 2018-10-17 MED ORDER — GABAPENTIN 400 MG PO CAPS
1200.0000 mg | ORAL_CAPSULE | Freq: Every day | ORAL | Status: DC
  Administered 2018-10-18 (×2): 1200 mg via ORAL
  Filled 2018-10-17 (×3): qty 3

## 2018-10-17 MED ORDER — TACROLIMUS 0.1 % EX OINT: TOPICAL_OINTMENT | Freq: Two times a day (BID) | CUTANEOUS | Status: DC

## 2018-10-17 MED ORDER — FOLIC ACID 1 MG PO TABS
1.0000 mg | ORAL_TABLET | Freq: Every day | ORAL | Status: DC
  Administered 2018-10-18 – 2018-10-19 (×2): 1 mg via ORAL
  Filled 2018-10-17 (×3): qty 1

## 2018-10-17 MED ORDER — HYDROXYZINE HCL 25 MG PO TABS
25.0000 mg | ORAL_TABLET | Freq: Four times a day (QID) | ORAL | Status: DC | PRN
  Administered 2018-10-18: 25 mg via ORAL
  Filled 2018-10-17 (×3): qty 1

## 2018-10-17 MED ORDER — ROPINIROLE HCL 1 MG PO TABS
0.5000 mg | ORAL_TABLET | Freq: Every day | ORAL | Status: DC
  Administered 2018-10-18 (×2): 0.5 mg via ORAL
  Filled 2018-10-17: qty 2
  Filled 2018-10-17: qty 1

## 2018-10-17 MED ORDER — ONDANSETRON HCL 4 MG/2ML IJ SOLN
4.0000 mg | Freq: Once | INTRAMUSCULAR | Status: AC
  Administered 2018-10-17: 4 mg via INTRAVENOUS
  Filled 2018-10-17: qty 2

## 2018-10-17 MED ORDER — SODIUM CHLORIDE 0.9 % IV BOLUS
1000.0000 mL | Freq: Once | INTRAVENOUS | Status: AC
  Administered 2018-10-17: 1000 mL via INTRAVENOUS

## 2018-10-17 MED ORDER — HYDROCODONE-ACETAMINOPHEN 7.5-325 MG PO TABS
1.0000 | ORAL_TABLET | Freq: Four times a day (QID) | ORAL | Status: DC | PRN
  Administered 2018-10-18 (×2): 1 via ORAL
  Filled 2018-10-17 (×2): qty 1

## 2018-10-17 MED ORDER — SODIUM CHLORIDE 0.9 % IV SOLN: INTRAVENOUS | Status: AC

## 2018-10-17 MED ORDER — ONDANSETRON 4 MG PO TBDP
ORAL_TABLET | ORAL | Status: AC
  Filled 2018-10-17: qty 1

## 2018-10-17 MED ORDER — SODIUM CHLORIDE 0.9% FLUSH: 3.0000 mL | Freq: Once | INTRAVENOUS | Status: DC

## 2018-10-17 MED ORDER — POLYETHYLENE GLYCOL 3350 17 G PO PACK
17.0000 g | PACK | Freq: Every day | ORAL | Status: DC
  Administered 2018-10-18: 09:00:00 17 g via ORAL
  Filled 2018-10-17 (×3): qty 1

## 2018-10-17 MED ORDER — PANCRELIPASE (LIP-PROT-AMYL) 12000-38000 UNITS PO CPEP
24000.0000 [IU] | ORAL_CAPSULE | Freq: Three times a day (TID) | ORAL | Status: DC
  Administered 2018-10-18 – 2018-10-19 (×3): 24000 [IU] via ORAL
  Filled 2018-10-17 (×5): qty 2

## 2018-10-17 MED ORDER — HYDROCORTISONE 1 % EX CREA
TOPICAL_CREAM | Freq: Two times a day (BID) | CUTANEOUS | Status: DC
  Administered 2018-10-18: 22:00:00 via TOPICAL
  Filled 2018-10-17: qty 28

## 2018-10-17 MED ORDER — DEXTROSE-NACL 5-0.45 % IV SOLN
INTRAVENOUS | Status: DC
  Administered 2018-10-17: 20:00:00 via INTRAVENOUS

## 2018-10-17 MED ORDER — BENAZEPRIL HCL 10 MG PO TABS
10.0000 mg | ORAL_TABLET | Freq: Every day | ORAL | Status: DC
  Administered 2018-10-18 – 2018-10-19 (×2): 10 mg via ORAL
  Filled 2018-10-17 (×3): qty 1

## 2018-10-17 NOTE — ED Notes (Signed)
States that she began to feel nauseas Wednesday morning. States that she has vomited many times since Wednesday, she is unable to recall how many times. She denies diarrhea. States that she has a headache, and her neck is hurting her. States that she is experiencing pain in her upper abdomen, mid and right side of back.

## 2018-10-17 NOTE — H&P (Signed)
[facility] [place] NAME: Brittany [name]    MR#:  [id number]  DATE OF BIRTH:  [birth year]  DATE OF ADMISSION:  10/17/2018  PRIMARY CARE PHYSICIAN: [facility]   REQUESTING/REFERRING PHYSICIAN: Harvest Dark, MD  CHIEF COMPLAINT:   Chief Complaint  Patient presents with   Nausea   Emesis   Headache    HISTORY OF PRESENT ILLNESS:  58 year old female with past medical history of COPD, tobacco abuse, diabetes mellitus, post pancreatectomy diabetes, hypertension, GERD, hyperlipidemia, RLS, B12 deficiency, and alcohol abuse presenting to the ED with chief complaints of nausea, vomiting, abdominal pain, headache, head and neck pain.  Patient report onset of symptoms since Wednesday morning following a meal she ate at [employer] that day. She has not been able to tolerate po intake since onset of symptoms and thinks she may be dehydrated. Denies associated symptoms of diarrhea, fevers or chills, chest pain, SOB, cough or recent sick contacts.  On arrival to the ED, she was afebrile with blood pressure 156/116 mm Hg and pulse rate109 beats/min. There were no focal neurological deficits; she was alert and oriented x4. Initial labs revealed glucose of 371, Lipase 33, sodium 128, bilirubin 1.5, Covid -19 negative. VBG showed pH 7.45, pCO2 48, Bicarb 31.9. Urinalysis showed presence of Ketones, Nitrite positive and bacteria. UDS positive for Marijuana. Hospitalist admitting for further management.  PAST MEDICAL HISTORY:   Past Medical History:  Diagnosis Date   Collagen vascular disease ([place])    Diabetes mellitus without complication ([place])    Hypertension    Lupus ([place])     PAST SURGICAL HISTORY:   Past Surgical History:  Procedure Laterality Date   PANCREATECTOMY     spleenectomy      SOCIAL HISTORY:   Social History   Tobacco Use   Smoking status: Never Smoker   Smokeless tobacco: Never Used    Substance Use Topics   Alcohol use: Not Currently    FAMILY HISTORY:  No family history on file.  DRUG ALLERGIES:   Allergies  Allergen Reactions   Cephalosporins Itching   Hydromorphone Itching   Keflin [Cephalothin] Itching    REVIEW OF SYSTEMS:   Review of Systems  Constitutional: Negative for chills, fever, malaise/fatigue and weight loss.  HENT: Negative for congestion, hearing loss and sore throat.   Eyes: Negative for blurred vision and double vision.  Respiratory: Negative for cough, shortness of breath and wheezing.   Cardiovascular: Negative for chest pain, palpitations, orthopnea and leg swelling.  Gastrointestinal: Positive for abdominal pain, nausea and vomiting. Negative for diarrhea.  Genitourinary: Negative for dysuria and urgency.  Musculoskeletal: Positive for back pain and neck pain. Negative for myalgias.  Skin: Negative for rash.  Neurological: Positive for headaches. Negative for dizziness, sensory change, speech change and focal weakness.  Psychiatric/Behavioral: Negative for depression.   MEDICATIONS AT HOME:   Prior to Admission medications   Medication Sig Start Date End Date Taking? Authorizing Provider  atorvastatin (LIPITOR) 40 MG tablet Take by mouth. 12/31/16 12/31/17  [provider]  B-D UF III MINI PEN NEEDLES 31G X 5 MM MISC daily. 07/30/17   [provider]  benazepril (LOTENSIN) 10 MG tablet Take 10 mg by mouth daily. 08/31/17   [provider]  Blood Glucose Monitoring Suppl (FIFTY50 GLUCOSE METER 2.0) w/Device KIT Use as directed 4 times a day. 12/31/16   [provider]  buPROPion (WELLBUTRIN XL) 150 MG 24 hr tablet  Take by mouth. 09/13/17 09/13/18  [provider]  ciclopirox (PENLAC) 8 % solution toenails 12/05/13   [provider]  CREON 24000-76000 units CPEP 3 TABLETS THREE TIMES A DAY 09/13/17   [provider]  cyanocobalamin (,VITAMIN B-12,) 1000 MCG/ML injection  INJECT 1 ML (1,000 MCG TOTAL) INTO THE MUSCLE MONTHLY 09/16/17   [provider]  desonide (DESOWEN) 0.05 % cream Apply topically. 06/11/16   [provider]  folic acid (FOLVITE) 1 MG tablet Take 1 mg by mouth daily. 09/13/17   [provider]  gabapentin (NEURONTIN) 600 MG tablet Take by mouth. 12/31/16   [provider]  glucose blood ([employer] TEST STRIPS) test strip Use 4 (four) times daily. Use as instructed. 03/25/14   [provider]  hydroxychloroquine (PLAQUENIL) 200 MG tablet Take 1 tablet every other day alternating with 2 tablets every other day. 12/31/16   [provider]  hydrOXYzine (ATARAX/VISTARIL) 10 MG tablet Take by mouth. 12/31/16   [provider]  meloxicam (MOBIC) 15 MG tablet Take 15 mg by mouth daily. 09/03/17   [provider]  metFORMIN (GLUCOPHAGE) 1000 MG tablet Take by mouth. 08/08/17 08/08/18  [provider]  mirtazapine (REMERON) 15 MG tablet  10/22/17   [provider]  ondansetron (ZOFRAN ODT) 4 MG disintegrating tablet Take 1 tablet (4 mg total) by mouth every 6 (six) hours as needed for nausea or vomiting. 05/12/18   Delman Kitten, MD  [facility] DELICA LANCETS FINE MISC 4 (FOUR) TIMES DAILY. AS DIRECTED 03/22/15   [provider]  rOPINIRole (REQUIP) 0.5 MG tablet Take by mouth. 12/31/16   [provider]  tacrolimus (PROTOPIC) 0.1 % ointment Use on the face for red spots, alternate with desonide. 06/18/14   [provider]  TOUJEO SOLOSTAR 300 UNIT/ML SOPN  10/19/17   [provider]      VITAL SIGNS:  Blood pressure (!) 161/114, pulse 74, temperature 98.2 F (36.8 C), resp. rate 18, height 5' (1.524 m), weight 52.2 kg, SpO2 100 %.  PHYSICAL EXAMINATION:   Physical Exam  GENERAL:  58 y.o.-year-old patient lying in the bed with no acute distress.  EYES: Pupils equal, round, reactive to light and accommodation. No scleral icterus. Extraocular muscles  intact.  HEENT: Head atraumatic, normocephalic. Oropharynx and nasopharynx clear.  NECK:  Supple, no jugular venous distention. No thyroid enlargement, no tenderness.  LUNGS: Normal breath sounds bilaterally, no wheezing, rales,rhonchi or crepitation. No use of accessory muscles of respiration.  CARDIOVASCULAR: S1, S2 normal. No murmurs, rubs, or gallops.  ABDOMEN: Soft, nontender, nondistended. Bowel sounds present. No organomegaly or mass.  EXTREMITIES: No pedal edema, cyanosis, or clubbing. No rash or lesions. + pedal pulses MUSCULOSKELETAL: Normal bulk, and power was 5+ grip and elbow, knee, and ankle flexion and extension bilaterally.  NEUROLOGIC:Alert and oriented x 3. CN 2-12 intact. Sensation to light touch and cold stimuli intact bilaterally. Gait not tested due to safety concern. PSYCHIATRIC: The patient is alert and oriented x 3.  SKIN: No obvious rash, lesion, or ulcer.   DATA REVIEWED:  LABORATORY PANEL:   CBC Recent Labs  Lab 10/17/18 1317  WBC 7.1  HGB 16.4*  HCT 47.2*  PLT 344   ------------------------------------------------------------------------------------------------------------------  Chemistries  Recent Labs  Lab 10/17/18 1317  NA 128*  K 4.4  CL 88*  CO2 20*  GLUCOSE 371*  BUN 23*  CREATININE 0.68  CALCIUM 9.7  AST 19  ALT 14  ALKPHOS 83  BILITOT  1.5*   ------------------------------------------------------------------------------------------------------------------  Cardiac Enzymes No results for input(s): TROPONINI in the last 168 hours. ------------------------------------------------------------------------------------------------------------------  RADIOLOGY:  No results found.  EKG:  EKG: there are no previous tracings available for comparison.  IMPRESSION AND PLAN:   58 y.o. female with past medical history of COPD, tobacco abuse, diabetes mellitus, post pancreatectomy diabetes, hypertension, GERD, hyperlipidemia, RLS, B12  deficiency, and alcohol abuse presenting to the ED with chief complaints of nausea, vomiting, abdominal pain, headache, head and neck pain.  1. Diabetic Ketoacidosis and Diabetes Mellitus - Likely due to UTI - Admit to step down unit - UA shows presence of ketones and UTI likely cause of DKA - Lipase normal, check HgbA1c - Continue Insulin drip, DKA protocol - Isotonic (NS,LR) bolus 1L / hr until euvolemic (expect extensive volume depletion, could require 5L+) - Glucose: q1h to titrate insulin - Lab monitoring: q2-4h BMP+Phosphorus+pH (ABG/VBG)  - Goal to normalize anion gap (eliminate beta-hydroxy butyrate / ketones) due to insulin deficiency (glucose and potassium repletion expected)  2. Urinary Tract Inferction - UA positive for UTI - Urine Culture - Start Empiric with IV Ceftriaxone  3. Hyperbilirubinemia -clear due to dehydration - AST and ALT normal - Continue to monitor  4. Hypovolemic-hyponatremia - Secondary to osmotic diuresis in the setting of DKA - Management as above - Continue to monitor labs  5. HTN  + Goal BP <130/80 - Continue Benazepril  6. HLD  + Goal LDL<100 - Atorvastatin 21m PO qhs  7. Depression - Continue Wellbutrin and Remeron  8. Polysubstance Abuse - Smoking and alcohol cessation counseling - No evidence of withdrawal  9. DVT prophylaxis - Enoxaparin  SubQ   All the records are reviewed and case discussed with ED provider. Management plans discussed with the patient, family and they are in agreement.  CODE STATUS: FULL  TOTAL TIME TAKING CARE OF THIS PATIENT: 50 minutes.    on 10/17/2018 at 5:59 PM  ERufina Falco DNP, FNP-BC [facility] Nurse Practitioner Between 7am to 6pm - Pager -[phone] After 6pm go to [email or web address] - pProofreader [facility]  Office  [phone] CC: Primary care physician; [facility]
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
## 2018-10-17 NOTE — ED Triage Notes (Signed)
Pt to ER states headache, back pain, nausea and vomiting for last 3 days.  Pt states she ate Brittany [name] prior to this starting.

## 2018-10-17 NOTE — ED Notes (Signed)
PIV now attempted by 3 ED RNs without success. IV team at bedside.

## 2018-10-17 NOTE — ED Notes (Signed)
Notified respiratory of venous blood gas sent to lab.

## 2018-10-18 LAB — BASIC METABOLIC PANEL
Anion gap: 10 (ref 5–15)
Anion gap: 10 (ref 5–15)
BUN: 14 mg/dL (ref 6–20)
BUN: 12 mg/dL (ref 6–20)
CO2: 25 mmol/L (ref 22–32)
CO2: 28 mmol/L (ref 22–32)
Calcium: 8.7 mg/dL — ABNORMAL LOW (ref 8.9–10.3)
Calcium: 8.8 mg/dL — ABNORMAL LOW (ref 8.9–10.3)
Chloride: 98 mmol/L (ref 98–111)
Chloride: 95 mmol/L — ABNORMAL LOW (ref 98–111)
Creatinine, Ser: 0.5 mg/dL (ref 0.44–1.00)
Creatinine, Ser: 0.67 mg/dL (ref 0.44–1.00)
GFR calc Af Amer: >60 mL/min (ref >60)
GFR calc Af Amer: >60 mL/min (ref >60)
GFR calc non Af Amer: >60 mL/min (ref >60)
GFR calc non Af Amer: >60 mL/min (ref >60)
Glucose, Bld: 95 mg/dL (ref 70–99)
Glucose, Bld: 256 mg/dL — ABNORMAL HIGH (ref 70–99)
Potassium: 3.5 mmol/L (ref 3.5–5.1)
Potassium: 4.3 mmol/L (ref 3.5–5.1)
Sodium: 133 mmol/L — ABNORMAL LOW (ref 135–145)
Sodium: 133 mmol/L — ABNORMAL LOW (ref 135–145)

## 2018-10-18 LAB — GLUCOSE, CAPILLARY
Glucose-Capillary: 158 mg/dL — ABNORMAL HIGH (ref 70–99)
Glucose-Capillary: 163 mg/dL — ABNORMAL HIGH (ref 70–99)
Glucose-Capillary: 180 mg/dL — ABNORMAL HIGH (ref 70–99)
Glucose-Capillary: 201 mg/dL — ABNORMAL HIGH (ref 70–99)
Glucose-Capillary: 202 mg/dL — ABNORMAL HIGH (ref 70–99)
Glucose-Capillary: 221 mg/dL — ABNORMAL HIGH (ref 70–99)
Glucose-Capillary: 74 mg/dL (ref 70–99)
Glucose-Capillary: 93 mg/dL (ref 70–99)

## 2018-10-18 LAB — BETA-HYDROXYBUTYRIC ACID: Beta-Hydroxybutyric Acid: <0.05 mmol/L — ABNORMAL LOW (ref 0.05–0.27)

## 2018-10-18 MED ORDER — INSULIN ASPART 100 UNIT/ML ~~LOC~~ SOLN
0.0000 [IU] | Freq: Every day | SUBCUTANEOUS | Status: DC
  Filled 2018-10-18: qty 1

## 2018-10-18 MED ORDER — INSULIN GLARGINE 100 UNIT/ML ~~LOC~~ SOLN: 6.0000 [IU] | Freq: Every day | SUBCUTANEOUS | Status: DC

## 2018-10-18 MED ORDER — ONDANSETRON HCL 4 MG/2ML IJ SOLN
4.0000 mg | Freq: Four times a day (QID) | INTRAMUSCULAR | Status: DC | PRN
  Administered 2018-10-18 (×2): 4 mg via INTRAVENOUS
  Filled 2018-10-18 (×2): qty 2

## 2018-10-18 MED ORDER — SODIUM CHLORIDE 0.9 % IV SOLN
INTRAVENOUS | Status: AC
  Administered 2018-10-18: 06:00:00 via INTRAVENOUS

## 2018-10-18 MED ORDER — INSULIN GLARGINE 100 UNIT/ML ~~LOC~~ SOLN
12.0000 [IU] | Freq: Every day | SUBCUTANEOUS | Status: DC
  Filled 2018-10-18: qty 0.12

## 2018-10-18 MED ORDER — INSULIN ASPART 100 UNIT/ML ~~LOC~~ SOLN
0.0000 [IU] | Freq: Three times a day (TID) | SUBCUTANEOUS | Status: DC
  Administered 2018-10-18: 09:00:00 3 [IU] via SUBCUTANEOUS
  Administered 2018-10-18: 17:00:00 2 [IU] via SUBCUTANEOUS
  Administered 2018-10-18: 12:00:00 3 [IU] via SUBCUTANEOUS
  Administered 2018-10-19: 09:00:00 2 [IU] via SUBCUTANEOUS
  Filled 2018-10-18 (×4): qty 1

## 2018-10-18 MED ORDER — INSULIN GLARGINE 100 UNIT/ML ~~LOC~~ SOLN
4.0000 [IU] | Freq: Every day | SUBCUTANEOUS | Status: DC
  Administered 2018-10-18 – 2018-10-19 (×2): 4 [IU] via SUBCUTANEOUS
  Filled 2018-10-18 (×2): qty 0.04

## 2018-10-18 NOTE — ED Notes (Signed)
ED TO INPATIENT HANDOFF REPORT  ED Nurse Name and Phone #: Wells Guiles [phone]  S Name/Age/Gender Brittany [name] 58 y.o. female Room/Bed: ED37A/ED37A  Code Status   Code Status: Full Code  Home/SNF/Other Home Patient oriented to: self, place, time and situation Is this baseline? Yes   Triage Complete: Triage complete  Chief Complaint headache, nausea  Triage Note Pt to ER states headache, back pain, nausea and vomiting for last 3 days.  Pt states she ate Janine Limbo prior to this starting.     Allergies Allergies  Allergen Reactions  . Cephalosporins Itching  . Hydromorphone Itching  . Keflin [Cephalothin] Itching    Level of Care/Admitting Diagnosis ED Disposition    ED Disposition Condition [facility] Area: [place] [[id number]]  Level of Care: Med-Surg [16]  Covid Evaluation: Asymptomatic Screening Protocol (No Symptoms)  Diagnosis: DKA (diabetic ketoacidoses) [facility]) [id number]  Admitting Physician: Eula Flax  Attending Physician: Rufina Falco ACHIENG [phone]  Estimated length of stay: past midnight tomorrow  Certification:: I certify this patient will need inpatient services for at least 2 midnights  PT Class (Do Not Modify): Inpatient [101]  PT Acc Code (Do Not Modify): Private [1]       B Medical/Surgery History Past Medical History:  Diagnosis Date  . Collagen vascular disease ([place])   . Diabetes mellitus without complication ([place])   . Hypertension   . Lupus [facility])    Past Surgical History:  Procedure Laterality Date  . PANCREATECTOMY    . spleenectomy       A IV Location/Drains/Wounds Patient Lines/Drains/Airways Status   Active Line/Drains/Airways    Name:   Placement date:   Placement time:   Site:   Days:   Peripheral IV 10/17/18 Left;Upper Arm   10/17/18    1728    Arm   1          Intake/Output Last 24 hours  Intake/Output Summary (Last 24 hours) at 10/18/2018 0524 Last data filed  at 10/17/2018 1838 Gross per 24 hour  Intake 1000 ml  Output -  Net 1000 ml    Labs/Imaging Results for orders placed or performed during the hospital encounter of 10/17/18 (from the past 48 hour(s))  Lipase, blood     Status: None   Collection Time: 10/17/18  1:17 PM  Result Value Ref Range   Lipase 33 11 - 51 U/L    Comment: Performed at [facility], [address] [place], [place] [address]  Comprehensive metabolic panel     Status: Abnormal   Collection Time: 10/17/18  1:17 PM  Result Value Ref Range   Sodium 128 (L) 135 - 145 mmol/L   Potassium 4.4 3.5 - 5.1 mmol/L   Chloride 88 (L) 98 - 111 mmol/L   CO2 20 (L) 22 - 32 mmol/L   Glucose, Bld 371 (H) 70 - 99 mg/dL   BUN 23 (H) 6 - 20 mg/dL   Creatinine, Ser 0.68 0.44 - 1.00 mg/dL   Calcium 9.7 8.9 - 10.3 mg/dL   Total Protein 8.7 (H) 6.5 - 8.1 g/dL   Albumin 4.2 3.5 - 5.0 g/dL   AST 19 15 - 41 U/L   ALT 14 0 - 44 U/L   Alkaline Phosphatase 83 38 - 126 U/L   Total Bilirubin 1.5 (H) 0.3 - 1.2 mg/dL   GFR calc non Af Amer >60 >60 mL/min   GFR calc Af Amer >60 >60 mL/min  Anion gap 20 (H) 5 - 15    Comment: Performed at [facility], [address] [place], [place] [address]  CBC     Status: Abnormal   Collection Time: 10/17/18  1:17 PM  Result Value Ref Range   WBC 7.1 4.0 - 10.5 K/uL   RBC 5.15 (H) 3.87 - 5.11 MIL/uL   Hemoglobin 16.4 (H) 12.0 - 15.0 g/dL   HCT 47.2 (H) 36.0 - 46.0 %   MCV 91.7 80.0 - 100.0 fL   MCH 31.8 26.0 - 34.0 pg   MCHC 34.7 30.0 - 36.0 g/dL   RDW 12.1 11.5 - 15.5 %   Platelets 344 150 - 400 K/uL   nRBC 0.0 0.0 - 0.2 %    Comment: Performed at [facility], [address] [place], [place] [address]  Hemoglobin A1c     Status: Abnormal   Collection Time: 10/17/18  1:17 PM  Result Value Ref Range   Hgb A1c MFr Bld 11.7 (H) 4.8 - 5.6 %    Comment: (NOTE) Pre diabetes:          5.7%-6.4% Diabetes:              >6.4% Glycemic control for   <7.0% adults with  diabetes    Mean Plasma Glucose 289.09 mg/dL    Comment: Performed at [facility], [place] [address], [place], [place] [address]  Blood gas, venous     Status: Abnormal   Collection Time: 10/17/18  4:39 PM  Result Value Ref Range   pH, Ven 7.43 7.250 - 7.430   pCO2, Ven 48 44.0 - 60.0 mmHg   Bicarbonate 31.9 (H) 20.0 - 28.0 mmol/L   Acid-Base Excess 6.4 (H) 0.0 - 2.0 mmol/L   O2 Saturation 20.7 %   Patient temperature 37.0    Collection site VENOUS    Sample type VENOUS     Comment: Performed at [facility], [address] [place], [place] [address]  SARS Coronavirus 2 [facility] order, Performed in [facility] hospital lab) Nasopharyngeal Nasopharyngeal Swab     Status: None   Collection Time: 10/17/18  4:39 PM   Specimen: Nasopharyngeal Swab  Result Value Ref Range   SARS Coronavirus 2 NEGATIVE NEGATIVE    Comment: (NOTE) If result is NEGATIVE SARS-CoV-2 target nucleic acids are NOT DETECTED. The SARS-CoV-2 RNA is generally detectable in upper and lower  respiratory specimens during the acute phase of infection. The lowest  concentration of SARS-CoV-2 viral copies this assay can detect is 250  copies / mL. A negative result does not preclude SARS-CoV-2 infection  and should not be used as the sole basis for treatment or other  patient management decisions.  A negative result may occur with  improper specimen collection / handling, submission of specimen other  than nasopharyngeal swab, presence of viral mutation(s) within the  areas targeted by this assay, and inadequate number of viral copies  (<250 copies / mL). A negative result must be combined with clinical  observations, patient history, and epidemiological information. If result is POSITIVE SARS-CoV-2 target nucleic acids are DETECTED. The SARS-CoV-2 RNA is generally detectable in upper and lower  respiratory specimens dur ing the acute phase of infection.  Positive  results are indicative of active  infection with SARS-CoV-2.  Clinical  correlation with patient history and other diagnostic information is  necessary to determine patient infection status.  Positive results do  not rule out bacterial infection or co-infection  with other viruses. If result is PRESUMPTIVE POSTIVE SARS-CoV-2 nucleic acids MAY BE PRESENT.   A presumptive positive result was obtained on the submitted specimen  and confirmed on repeat testing.  While 2019 novel coronavirus  (SARS-CoV-2) nucleic acids may be present in the submitted sample  additional confirmatory testing may be necessary for epidemiological  and / or clinical management purposes  to differentiate between  SARS-CoV-2 and other Sarbecovirus currently known to infect humans.  If clinically indicated additional testing with an alternate test  methodology [phone]) is advised. The SARS-CoV-2 RNA is generally  detectable in upper and lower respiratory sp ecimens during the acute  phase of infection. The expected result is Negative. Fact Sheet for Patients:  StrictlyIdeas.no Fact Sheet for Healthcare Providers: BankingDealers.co.za This test is not yet approved or cleared by the [place] FDA and has been authorized for detection and/or diagnosis of SARS-CoV-2 by FDA under an Emergency Use Authorization (EUA).  This EUA will remain in effect (meaning this test can be used) for the duration of the COVID-19 declaration under Section 564(b)(1) of the Act, 21 U.S.C. section 360bbb-3(b)(1), unless the authorization is terminated or revoked sooner. Performed at [facility], [address] [place], [place] [address]   Glucose, capillary     Status: Abnormal   Collection Time: 10/17/18  5:33 PM  Result Value Ref Range   Glucose-Capillary 338 (H) 70 - 99 mg/dL  Glucose, capillary     Status: Abnormal   Collection Time: 10/17/18  6:35 PM  Result Value Ref Range   Glucose-Capillary 243 (H)  70 - 99 mg/dL  Urinalysis, Complete w Microscopic     Status: Abnormal   Collection Time: 10/17/18  6:40 PM  Result Value Ref Range   Color, Urine YELLOW (A) YELLOW   APPearance CLEAR (A) CLEAR   Specific Gravity, Urine 1.031 (H) 1.005 - 1.030   pH 5.0 5.0 - 8.0   Glucose, UA >=500 (A) NEGATIVE mg/dL   Hgb urine dipstick SMALL (A) NEGATIVE   Bilirubin Urine NEGATIVE NEGATIVE   Ketones, ur 80 (A) NEGATIVE mg/dL   Protein, ur 30 (A) NEGATIVE mg/dL   Nitrite POSITIVE (A) NEGATIVE   Leukocytes,Ua NEGATIVE NEGATIVE   RBC / HPF 0-5 0 - 5 RBC/hpf   WBC, UA 0-5 0 - 5 WBC/hpf   Bacteria, UA MANY (A) NONE SEEN   Squamous Epithelial / LPF 0-5 0 - 5   Mucus PRESENT     Comment: Performed at [facility], [address] [place], [place] [address]  Urine Drug Screen, Qualitative ([facility] only)     Status: Abnormal   Collection Time: 10/17/18  6:40 PM  Result Value Ref Range   Tricyclic, Ur Screen NONE DETECTED NONE DETECTED   Amphetamines, Ur Screen NONE DETECTED NONE DETECTED   MDMA (Ecstasy)Ur Screen NONE DETECTED NONE DETECTED   Cocaine Metabolite,Ur [place] NONE DETECTED NONE DETECTED   Opiate, Ur Screen NONE DETECTED NONE DETECTED   Phencyclidine (PCP) Ur S NONE DETECTED NONE DETECTED   Cannabinoid 50 Ng, Ur [place] POSITIVE (A) NONE DETECTED   Barbiturates, Ur Screen NONE DETECTED NONE DETECTED   Benzodiazepine, Ur Scrn NONE DETECTED NONE DETECTED   Methadone Scn, Ur NONE DETECTED NONE DETECTED    Comment: (NOTE) Tricyclics + metabolites, urine    Cutoff 1000 ng/mL Amphetamines + metabolites, urine  Cutoff 1000 ng/mL MDMA (Ecstasy), urine              Cutoff 500 ng/mL Cocaine Metabolite,  urine          Cutoff 300 ng/mL Opiate + metabolites, urine        Cutoff 300 ng/mL Phencyclidine (PCP), urine         Cutoff 25 ng/mL Cannabinoid, urine                 Cutoff 50 ng/mL Barbiturates + metabolites, urine  Cutoff 200 ng/mL Benzodiazepine, urine              Cutoff 200 ng/mL Methadone,  urine                   Cutoff 300 ng/mL The urine drug screen provides only a preliminary, unconfirmed analytical test result and should not be used for non-medical purposes. Clinical consideration and professional judgment should be applied to any positive drug screen result due to possible interfering substances. A more specific alternate chemical method must be used in order to obtain a confirmed analytical result. Gas chromatography / mass spectrometry (GC/MS) is the preferred confirmat ory method. Performed at [facility], [address] [place], [place] [address]   Glucose, capillary     Status: Abnormal   Collection Time: 10/17/18  7:37 PM  Result Value Ref Range   Glucose-Capillary 185 (H) 70 - 99 mg/dL  Glucose, capillary     Status: None   Collection Time: 10/18/18 12:42 AM  Result Value Ref Range   Glucose-Capillary 93 70 - 99 mg/dL  Beta-hydroxybutyric acid     Status: Abnormal   Collection Time: 10/18/18 12:48 AM  Result Value Ref Range   Beta-Hydroxybutyric Acid <0.05 (L) 0.05 - 0.27 mmol/L    Comment: Performed at [facility], [address] [place], [place] [id number]  Basic metabolic panel     Status: Abnormal   Collection Time: 10/18/18 12:48 AM  Result Value Ref Range   Sodium 133 (L) 135 - 145 mmol/L   Potassium 3.5 3.5 - 5.1 mmol/L   Chloride 98 98 - 111 mmol/L   CO2 25 22 - 32 mmol/L   Glucose, Bld 95 70 - 99 mg/dL   BUN 14 6 - 20 mg/dL   Creatinine, Ser 0.50 0.44 - 1.00 mg/dL   Calcium 8.7 (L) 8.9 - 10.3 mg/dL   GFR calc non Af Amer >60 >60 mL/min   GFR calc Af Amer >60 >60 mL/min   Anion gap 10 5 - 15    Comment: Performed at [facility], [address] [place], [place] [address]  Glucose, capillary     Status: None   Collection Time: 10/18/18  1:43 AM  Result Value Ref Range   Glucose-Capillary 74 70 - 99 mg/dL  Glucose, capillary     Status: Abnormal   Collection Time: 10/18/18  3:04 AM  Result Value Ref  Range   Glucose-Capillary 158 (H) 70 - 99 mg/dL   No results found.  Pending Labs Unresulted Labs (From admission, onward)    Start     Ordered   10/18/18 [id number]  Basic metabolic panel  Daily,   STAT     10/18/18 0140   10/17/18 2135  Urine Culture  Add-on,   AD     10/17/18 2134   10/17/18 1721  HIV antibody (Routine Testing)  Once,   STAT     10/17/18 1722          Vitals/Pain Today's Vitals   10/17/18 1930 10/17/18 1931 10/17/18 1956 10/18/18 0147  BP: (!) 169/94   Marland Kitchen)  164/94  Pulse: 74 75  75  Resp: 18   18  Temp:    97.7 F (36.5 C)  TempSrc:    Oral  SpO2: 99% 100%  99%  Weight:      Height:      PainSc:   6      Isolation Precautions No active isolations  Medications Medications  0.9 %  sodium chloride infusion ( Intravenous Canceled Entry 10/17/18 1940)  enoxaparin (LOVENOX) injection 40 mg (40 mg Subcutaneous Given 10/18/18 0130)  morphine 2 MG/ML injection 2 mg (2 mg Intravenous Given 10/17/18 1923)  HYDROcodone-acetaminophen (NORCO) 7.5-325 MG per tablet 1 tablet (has no administration in time range)  cefTRIAXone (ROCEPHIN) 1 g in sodium chloride 0.9 % 100 mL IVPB (0 g Intravenous Stopped 10/18/18 0155)  atorvastatin (LIPITOR) tablet 40 mg (has no administration in time range)  benazepril (LOTENSIN) tablet 10 mg (has no administration in time range)  buPROPion (WELLBUTRIN XL) 24 hr tablet 300 mg (has no administration in time range)  hydrocortisone cream 1 % (has no administration in time range)  folic acid (FOLVITE) tablet 1 mg (has no administration in time range)  gabapentin (NEURONTIN) capsule 1,200 mg (1,200 mg Oral Given 10/18/18 0129)  hydroxychloroquine (PLAQUENIL) tablet 200-400 mg (has no administration in time range)  hydrOXYzine (ATARAX/VISTARIL) tablet 25 mg (25 mg Oral Given 10/18/18 0130)  mirtazapine (REMERON) tablet 15 mg (15 mg Oral Given 10/18/18 0128)  lipase/protease/amylase (CREON) capsule 24,000 Units (has no administration in time range)   polyethylene glycol (MIRALAX / GLYCOLAX) packet 17 g (has no administration in time range)  rOPINIRole (REQUIP) tablet 0.5 mg (0.5 mg Oral Given 10/18/18 0128)  tacrolimus (PROTOPIC) 0.1 % ointment ( Topical Canceled Entry 10/18/18 0104)  insulin aspart (novoLOG) injection 0-9 Units (has no administration in time range)  insulin aspart (novoLOG) injection 0-5 Units (0 Units Subcutaneous Not Given 10/18/18 0155)  0.9 %  sodium chloride infusion (has no administration in time range)  ondansetron (ZOFRAN-ODT) disintegrating tablet 4 mg (4 mg Oral Given 10/17/18 1328)  sodium chloride 0.9 % bolus 1,000 mL (0 mLs Intravenous Stopped 10/17/18 1838)  sodium chloride 0.9 % bolus 1,000 mL (0 mLs Intravenous Stopped 10/17/18 1955)  potassium chloride 10 mEq in 100 mL IVPB (0 mEq Intravenous Stopped 10/17/18 1953)  ondansetron (ZOFRAN) injection 4 mg (4 mg Intravenous Given 10/17/18 1744)    Mobility walks Low fall risk   Focused Assessments DKA   R Recommendations: See Admitting Provider Note  Report given to:   Additional Notes:

## 2018-10-18 NOTE — Progress Notes (Signed)
58 year old female admitted for DKA.  Insulin drip initiated while boarding in the emergency department.  Now anion gap is closed and patient has been allowed to eat.  Insulin drip has been discontinued.  Due to bed placement issues, transition to phase 2 of ketoacidosis treatment slightly delayed.  Patient is stable.  Fingerstick blood glucose stable x2 following discontinuation of insulin.  I will give half of her basal insulin dose now and work her up to home doses of basal insulin over the next 36 hrs.

## 2018-10-18 NOTE — Progress Notes (Signed)
Report received from ED, vital signs obtained. Patient asleep in bed, will give report to oncoming RN.

## 2018-10-18 NOTE — Progress Notes (Signed)
[place] [facility] [place] NAME: Brittany [name]    MR#:  [id number]  DATE OF BIRTH:  04/20/1960  SUBJECTIVE:  CHIEF COMPLAINT:   Chief Complaint  Patient presents with  . Nausea  . Emesis  . Headache   Came with nausea vomiting and uncontrolled diabetes and noted to have DKA.  Corrected acidosis after short period of IV insulin. Patient still have some nausea this morning and only ate just a few bites from her breakfast. Her blood sugar is under control.  REVIEW OF SYSTEMS:  CONSTITUTIONAL: No fever, fatigue or weakness.  EYES: No blurred or double vision.  EARS, NOSE, AND THROAT: No tinnitus or ear pain.  RESPIRATORY: No cough, shortness of breath, wheezing or hemoptysis.  CARDIOVASCULAR: No chest pain, orthopnea, edema.  GASTROINTESTINAL: some nausea,no vomiting, diarrhea or abdominal pain.  GENITOURINARY: No dysuria, hematuria.  ENDOCRINE: No polyuria, nocturia,  HEMATOLOGY: No anemia, easy bruising or bleeding SKIN: No rash or lesion. MUSCULOSKELETAL: No joint pain or arthritis.   NEUROLOGIC: No tingling, numbness, weakness.  PSYCHIATRY: No anxiety or depression.   ROS  DRUG ALLERGIES:   Allergies  Allergen Reactions  . Cephalosporins Itching  . Hydromorphone Itching  . Keflin [Cephalothin] Itching    VITALS:  Blood pressure (!) 172/98, pulse 76, temperature 98.9 F (37.2 C), temperature source Oral, resp. rate 16, height 5' (1.524 m), weight 52.2 kg, SpO2 100 %.  PHYSICAL EXAMINATION:  GENERAL:  58 y.o.-year-old patient lying in the bed with no acute distress.  EYES: Pupils equal, round, reactive to light and accommodation. No scleral icterus. Extraocular muscles intact.  HEENT: atraumatic, normocephalic. Oropharynx and nasopharynx clear.  NECK:  Supple, no jugular venous distention. No thyroid enlargement, no tenderness.  LUNGS: Normal breath sounds bilaterally, no wheezing, rales,rhonchi or crepitation. No use of accessory  muscles of respiration.  CARDIOVASCULAR: S1, S2 normal. No murmurs, rubs, or gallops.  ABDOMEN: Soft, nontender, nondistended. Bowel sounds present. No organomegaly or mass.  EXTREMITIES: No pedal edema, cyanosis, or clubbing.  NEUROLOGIC: Cranial nerves II through XII are intact. Muscle strength 5/5 in all extremities. Sensation intact. Gait not checked.  PSYCHIATRIC: The patient is alert and oriented x 3.  SKIN: No obvious rash, lesion, or ulcer.   Physical Exam LABORATORY PANEL:   CBC Recent Labs  Lab 10/17/18 1317  WBC 7.1  HGB 16.4*  HCT 47.2*  PLT 344   ------------------------------------------------------------------------------------------------------------------  Chemistries  Recent Labs  Lab 10/17/18 1317  10/18/18 0529  NA 128*   < > 133*  K 4.4   < > 4.3  CL 88*   < > 95*  CO2 20*   < > 28  GLUCOSE 371*   < > 256*  BUN 23*   < > 12  CREATININE 0.68   < > 0.67  CALCIUM 9.7   < > 8.8*  AST 19  --   --   ALT 14  --   --   ALKPHOS 83  --   --   BILITOT 1.5*  --   --    < > = values in this interval not displayed.   ------------------------------------------------------------------------------------------------------------------  Cardiac Enzymes No results for input(s): TROPONINI in the last 168 hours. ------------------------------------------------------------------------------------------------------------------  RADIOLOGY:  No results found.  ASSESSMENT AND PLAN:   Active Problems:   DKA (diabetic ketoacidoses) ([place])   *DKA Corrected with insulin drip, now back to her home dose regimen and blood sugar is under control. Still have  some nausea so I would monitor in the hospital for 1 more day for blood sugar control and able to take oral intake.  *UTI Follow urine cultures, empiric ceftriaxone now  *Hypertension Continue home meds.  *Hyperlipidemia Continue atorvastatin.  *Active smoking Counseled to quit smoking for 4 minutes.   All  the records are reviewed and case discussed with Care Management/Social Workerr. Management plans discussed with the patient, family and they are in agreement.  CODE STATUS: Full.  TOTAL TIME TAKING CARE OF THIS PATIENT: 35 minutes.     POSSIBLE D/C IN 1-2 DAYS, DEPENDING ON CLINICAL CONDITION.   Vaughan Basta M.D on 10/18/2018   Between 7am to 6pm - Pager - [phone]  After 6pm go to [email or web address] - password [email or web address] [facility]  Office  [phone]  CC: Primary care physician; [facility]  Note: This dictation was prepared with Dragon dictation along with smaller phrase technology. Any transcriptional errors that result from this process are unintentional.

## 2018-10-18 NOTE — Plan of Care (Signed)

## 2018-10-19 LAB — GLUCOSE, CAPILLARY
Glucose-Capillary: 197 mg/dL — ABNORMAL HIGH (ref 70–99)
Glucose-Capillary: 264 mg/dL — ABNORMAL HIGH (ref 70–99)

## 2018-10-19 LAB — BASIC METABOLIC PANEL
Anion gap: 10 (ref 5–15)
BUN: 9 mg/dL (ref 6–20)
CO2: 28 mmol/L (ref 22–32)
Calcium: 8.7 mg/dL — ABNORMAL LOW (ref 8.9–10.3)
Chloride: 99 mmol/L (ref 98–111)
Creatinine, Ser: 0.56 mg/dL (ref 0.44–1.00)
GFR calc Af Amer: >60 mL/min (ref >60)
GFR calc non Af Amer: >60 mL/min (ref >60)
Glucose, Bld: 234 mg/dL — ABNORMAL HIGH (ref 70–99)
Potassium: 3.1 mmol/L — ABNORMAL LOW (ref 3.5–5.1)
Sodium: 137 mmol/L (ref 135–145)

## 2018-10-19 MED ORDER — CEFUROXIME AXETIL 250 MG PO TABS: 250.0000 mg | ORAL_TABLET | Freq: Two times a day (BID) | ORAL | 0 refills | Status: AC

## 2018-10-19 NOTE — Progress Notes (Signed)
Pt d/c to home via daughter. IV removed intact. VSS. Education completed. Refused wheelchair. Refused lunch insulin. All questions answered and all belongings sent with pt.

## 2018-10-19 NOTE — Discharge Summary (Signed)
[name] NAME: [name]    MR#:  [id number]  DATE OF BIRTH:  Mar 20, 1960  DATE OF ADMISSION:  10/17/2018 ADMITTING PHYSICIAN: Lang Snow, NP  DATE OF DISCHARGE: 10/19/2018   PRIMARY CARE PHYSICIAN: [place]    ADMISSION DIAGNOSIS:  Alcoholic ketoacidosis [W54.6] Diabetic ketoacidosis without coma associated with other specified diabetes mellitus ([place]) [E13.10] DKA (diabetic ketoacidoses) ([place]) [E11.10]  DISCHARGE DIAGNOSIS:  Active Problems:   DKA (diabetic ketoacidoses) ([place]) UTi  SECONDARY DIAGNOSIS:   Past Medical History:  Diagnosis Date  . Collagen vascular disease ([place])   . Diabetes mellitus without complication ([place])   . Hypertension   . Lupus [facility])     HOSPITAL COURSE:   *DKA Corrected with insulin drip, now back to her home dose regimen and blood sugar is under control. Much improved now.  No more nausea and able to tolerate oral diet and blood sugar is under control.  *UTI Follow urine cultures, empiric ceftriaxone now Urine culture is growing gram-negative rods but final sensitivities still awaited but patient feeling symptomatically much better.  No fever.  No nausea. I will give oral cefuroxime at home for 3 more days. *Hypertension Continue home meds.  *Hyperlipidemia Continue atorvastatin.  *Active smoking Counseled to quit smoking for 4 minutes.   DISCHARGE CONDITIONS:   Stable.  CONSULTS OBTAINED:    DRUG ALLERGIES:   Allergies  Allergen Reactions  . Cephalosporins Itching  . Hydromorphone Itching  . Keflin [Cephalothin] Itching    DISCHARGE MEDICATIONS:   Allergies as of 10/19/2018      Reactions   Cephalosporins Itching   Hydromorphone Itching   Keflin [cephalothin] Itching      Medication List    TAKE these medications   atorvastatin 40 MG tablet Commonly known as: LIPITOR Take 40 mg by mouth Nightly.   benazepril  10 MG tablet Commonly known as: LOTENSIN Take 10 mg by mouth daily.   buPROPion 300 MG 24 hr tablet Commonly known as: WELLBUTRIN XL Take 300 mg by mouth daily.   cefUROXime 250 MG tablet Commonly known as: Ceftin Take 1 tablet (250 mg total) by mouth 2 (two) times daily for 3 days.   ciclopirox 8 % solution Commonly known as: PENLAC toenails   cyanocobalamin 1000 MCG/ML injection Commonly known as: (VITAMIN B-12) INJECT 1 ML (1,000 MCG TOTAL) INTO THE MUSCLE MONTHLY   desonide 0.05 % cream Commonly known as: DESOWEN Apply topically.   Fifty50 Glucose Meter 2.0 w/Device Kit Use as directed 4 times a day.   folic acid 1 MG tablet Commonly known as: FOLVITE Take 1 mg by mouth daily.   gabapentin 600 MG tablet Commonly known as: NEURONTIN Take 1,200 mg by mouth at bedtime.   HumaLOG KwikPen 100 UNIT/ML KwikPen Generic drug: insulin lispro Inject 4 Units into the skin 3 (three) times daily with meals.   hydroxychloroquine 200 MG tablet Commonly known as: PLAQUENIL Take 200-400 mg by mouth daily.   hydrOXYzine 10 MG tablet Commonly known as: ATARAX/VISTARIL Take by mouth.   Insulin Glargine (1 Unit Dial) 300 UNIT/ML Sopn Inject 12 Units into the skin every morning.   [employer] TEST STRIPS test strip Generic drug: glucose blood Use 4 (four) times daily. Use as instructed.   meloxicam 15 MG tablet Commonly known as: MOBIC Take 15 mg by mouth daily.   metFORMIN 1000 MG tablet Commonly known as: GLUCOPHAGE Take by mouth.   mirtazapine 15  MG tablet Commonly known as: REMERON Take 15 mg by mouth at bedtime.   ondansetron 4 MG disintegrating tablet Commonly known as: Zofran ODT Take 1 tablet (4 mg total) by mouth every 6 (six) hours as needed for nausea or vomiting.   OneTouch Delica Lancets Fine Misc 4 (FOUR) TIMES DAILY. AS DIRECTED   Pancrelipase (Lip-Prot-Amyl) 24000-76000 units Cpep Take 3 capsules by mouth 3 (three) times daily.   polyethylene glycol  17 g packet Commonly known as: MIRALAX / GLYCOLAX Take 17 g by mouth daily.   rOPINIRole 0.5 MG tablet Commonly known as: REQUIP Take 0.5 mg by mouth at bedtime.   tacrolimus 0.1 % ointment Commonly known as: PROTOPIC Use on the face for red spots, alternate with desonide.        DISCHARGE INSTRUCTIONS:    Follow with PMD in 1 week.  If you experience worsening of your admission symptoms, develop shortness of breath, life threatening emergency, suicidal or homicidal thoughts you must seek medical attention immediately by calling 911 or calling your MD immediately  if symptoms less severe.  You Must read complete instructions/literature along with all the possible adverse reactions/side effects for all the Medicines you take and that have been prescribed to you. Take any new Medicines after you have completely understood and accept all the possible adverse reactions/side effects.   Please note  You were cared for by a hospitalist during your hospital stay. If you have any questions about your discharge medications or the care you received while you were in the hospital after you are discharged, you can call the unit and asked to speak with the hospitalist on call if the hospitalist that took care of you is not available. Once you are discharged, your primary care physician will handle any further medical issues. Please note that NO REFILLS for any discharge medications will be authorized once you are discharged, as it is imperative that you return to your primary care physician (or establish a relationship with a primary care physician if you do not have one) for your aftercare needs so that they can reassess your need for medications and monitor your lab values.    Today   CHIEF COMPLAINT:   Chief Complaint  Patient presents with  . Nausea  . Emesis  . Headache    HISTORY OF PRESENT ILLNESS:  Brittany [name]  is a 58 y.o. female with a known history of COPD, tobacco abuse,  diabetes mellitus, post pancreatectomy diabetes, hypertension, GERD, hyperlipidemia, RLS, B12 deficiency, and alcohol abuse presenting to the ED with chief complaints of nausea, vomiting, abdominal pain, headache, head and neck pain.  Patient report onset of symptoms since Wednesday morning following a meal she ate at [employer] that day. She has not been able to tolerate po intake since onset of symptoms and thinks she may be dehydrated. Denies associated symptoms of diarrhea, fevers or chills, chest pain, SOB, cough or recent sick contacts.  On arrival to the ED, she was afebrile with blood pressure 156/116 mm Hg and pulse rate109 beats/min. There were no focal neurological deficits; she was alert and oriented x4. Initial labs revealed glucose of 371, Lipase 33, sodium 128, bilirubin 1.5, Covid -19 negative. VBG showed pH 7.45, pCO2 48, Bicarb 31.9. Urinalysis showed presence of Ketones, Nitrite positive and bacteria. UDS positive for Marijuana. Hospitalist admitting for further management.   VITAL SIGNS:  Blood pressure 135/86, pulse 72, temperature 98.3 F (36.8 C), temperature source Oral, resp. rate 16, height 5' (  1.524 m), weight 52.2 kg, SpO2 100 %.  I/O:    Intake/Output Summary (Last 24 hours) at 10/19/2018 1153 Last data filed at 10/19/2018 1008 Gross per 24 hour  Intake 356.91 ml  Output -  Net 356.91 ml    PHYSICAL EXAMINATION:  GENERAL:  58 y.o.-year-old patient lying in the bed with no acute distress.  EYES: Pupils equal, round, reactive to light and accommodation. No scleral icterus. Extraocular muscles intact.  HEENT: Head atraumatic, normocephalic. Oropharynx and nasopharynx clear.  NECK:  Supple, no jugular venous distention. No thyroid enlargement, no tenderness.  LUNGS: Normal breath sounds bilaterally, no wheezing, rales,rhonchi or crepitation. No use of accessory muscles of respiration.  CARDIOVASCULAR: S1, S2 normal. No murmurs, rubs, or gallops.  ABDOMEN: Soft,  non-tender, non-distended. Bowel sounds present. No organomegaly or mass.  EXTREMITIES: No pedal edema, cyanosis, or clubbing.  NEUROLOGIC: Cranial nerves II through XII are intact. Muscle strength 5/5 in all extremities. Sensation intact. Gait not checked.  PSYCHIATRIC: The patient is alert and oriented x 3.  SKIN: No obvious rash, lesion, or ulcer.   DATA REVIEW:   CBC Recent Labs  Lab 10/17/18 1317  WBC 7.1  HGB 16.4*  HCT 47.2*  PLT 344    Chemistries  Recent Labs  Lab 10/17/18 1317  10/19/18 0647  NA 128*   < > 137  K 4.4   < > 3.1*  CL 88*   < > 99  CO2 20*   < > 28  GLUCOSE 371*   < > 234*  BUN 23*   < > 9  CREATININE 0.68   < > 0.56  CALCIUM 9.7   < > 8.7*  AST 19  --   --   ALT 14  --   --   ALKPHOS 83  --   --   BILITOT 1.5*  --   --    < > = values in this interval not displayed.    Cardiac Enzymes No results for input(s): TROPONINI in the last 168 hours.  Microbiology Results  Results for orders placed or performed during the hospital encounter of 10/17/18  SARS Coronavirus 2 [facility] order, Performed in [facility] hospital lab) Nasopharyngeal Nasopharyngeal Swab     Status: None   Collection Time: 10/17/18  4:39 PM   Specimen: Nasopharyngeal Swab  Result Value Ref Range Status   SARS Coronavirus 2 NEGATIVE NEGATIVE Final    Comment: (NOTE) If result is NEGATIVE SARS-CoV-2 target nucleic acids are NOT DETECTED. The SARS-CoV-2 RNA is generally detectable in upper and lower  respiratory specimens during the acute phase of infection. The lowest  concentration of SARS-CoV-2 viral copies this assay can detect is 250  copies / mL. A negative result does not preclude SARS-CoV-2 infection  and should not be used as the sole basis for treatment or other  patient management decisions.  A negative result may occur with  improper specimen collection / handling, submission of specimen other  than nasopharyngeal swab, presence of viral mutation(s) within the   areas targeted by this assay, and inadequate number of viral copies  (<250 copies / mL). A negative result must be combined with clinical  observations, patient history, and epidemiological information. If result is POSITIVE SARS-CoV-2 target nucleic acids are DETECTED. The SARS-CoV-2 RNA is generally detectable in upper and lower  respiratory specimens dur ing the acute phase of infection.  Positive  results are indicative of active infection with SARS-CoV-2.  Clinical  correlation with  patient history and other diagnostic information is  necessary to determine patient infection status.  Positive results do  not rule out bacterial infection or co-infection with other viruses. If result is PRESUMPTIVE POSTIVE SARS-CoV-2 nucleic acids MAY BE PRESENT.   A presumptive positive result was obtained on the submitted specimen  and confirmed on repeat testing.  While 2019 novel coronavirus  (SARS-CoV-2) nucleic acids may be present in the submitted sample  additional confirmatory testing may be necessary for epidemiological  and / or clinical management purposes  to differentiate between  SARS-CoV-2 and other Sarbecovirus currently known to infect humans.  If clinically indicated additional testing with an alternate test  methodology [phone]) is advised. The SARS-CoV-2 RNA is generally  detectable in upper and lower respiratory sp ecimens during the acute  phase of infection. The expected result is Negative. Fact Sheet for Patients:  StrictlyIdeas.no Fact Sheet for Healthcare Providers: BankingDealers.co.za This test is not yet approved or cleared by the [place] FDA and has been authorized for detection and/or diagnosis of SARS-CoV-2 by FDA under an Emergency Use Authorization (EUA).  This EUA will remain in effect (meaning this test can be used) for the duration of the COVID-19 declaration under Section 564(b)(1) of the Act, 21  U.S.C. section 360bbb-3(b)(1), unless the authorization is terminated or revoked sooner. Performed at [facility], [address] [place], [place] [address]   Urine Culture     Status: Abnormal (Preliminary result)   Collection Time: 10/17/18  9:35 PM   Specimen: Urine, Random  Result Value Ref Range Status   Specimen Description   Final    URINE, RANDOM Performed at [facility], [address] [place], [place] [address]    Special Requests   Final    NONE Performed at [facility], [address] [place], [place] [address]    Culture >=100,000 COLONIES/mL GRAM NEGATIVE RODS (A)  Final   Report Status PENDING  Incomplete    RADIOLOGY:  No results found.  EKG:   Orders placed or performed during the hospital encounter of 05/12/18  . ED EKG  . ED EKG  . EKG 12-Lead  . EKG 12-Lead  . EKG 12-Lead  . EKG 12-Lead      Management plans discussed with the patient, family and they are in agreement.  CODE STATUS: full.    Code Status Orders  (From admission, onward)         Start     Ordered   10/17/18 1721  Full code  Continuous     10/17/18 1722        Code Status History    This patient has a current code status but no historical code status.   Advance Care Planning Activity      TOTAL TIME TAKING CARE OF THIS PATIENT: 35 minutes.    Vaughan Basta M.D on 10/19/2018 at 11:53 AM  Between 7am to 6pm - Pager - [phone]  After 6pm go to [email or web address] - password [email or web address] [facility]  Office  [phone]  CC: Primary care physician; [facility]   Note: This dictation was prepared with Dragon dictation along with smaller phrase technology. Any transcriptional errors that result from this process are unintentional.

## 2018-10-20 LAB — URINE CULTURE: Culture: >=100000 — AB

## 2018-10-21 LAB — HIV ANTIBODY (ROUTINE TESTING W REFLEX): HIV Screen 4th Generation wRfx: NONREACTIVE

## 2019-03-15 ENCOUNTER — Other Ambulatory Visit: Payer: Self-pay

## 2019-03-15 ENCOUNTER — Encounter: Payer: Self-pay | Admitting: Emergency Medicine

## 2019-03-15 ENCOUNTER — Emergency Department
Admission: EM | Admit: 2019-03-15 | Discharge: 2019-03-16 | Disposition: A | Discharge status: Home / Self Care | Payer: Medicare Other | Attending: Emergency Medicine | Admitting: Emergency Medicine

## 2019-03-15 DIAGNOSIS — Z794 Long term (current) use of insulin: Secondary | ICD-10-CM | POA: Insufficient documentation

## 2019-03-15 DIAGNOSIS — Z79899 Other long term (current) drug therapy: Secondary | ICD-10-CM | POA: Diagnosis not present

## 2019-03-15 DIAGNOSIS — F17210 Nicotine dependence, cigarettes, uncomplicated: Secondary | ICD-10-CM | POA: Diagnosis not present

## 2019-03-15 DIAGNOSIS — R739 Hyperglycemia, unspecified: Secondary | ICD-10-CM

## 2019-03-15 DIAGNOSIS — K529 Noninfective gastroenteritis and colitis, unspecified: Secondary | ICD-10-CM

## 2019-03-15 DIAGNOSIS — R1110 Vomiting, unspecified: Secondary | ICD-10-CM | POA: Diagnosis not present

## 2019-03-15 DIAGNOSIS — R1084 Generalized abdominal pain: Secondary | ICD-10-CM | POA: Insufficient documentation

## 2019-03-15 DIAGNOSIS — J449 Chronic obstructive pulmonary disease, unspecified: Secondary | ICD-10-CM | POA: Diagnosis not present

## 2019-03-15 DIAGNOSIS — E1165 Type 2 diabetes mellitus with hyperglycemia: Secondary | ICD-10-CM | POA: Diagnosis not present

## 2019-03-15 DIAGNOSIS — R112 Nausea with vomiting, unspecified: Secondary | ICD-10-CM

## 2019-03-15 DIAGNOSIS — I10 Essential (primary) hypertension: Secondary | ICD-10-CM | POA: Diagnosis not present

## 2019-03-15 HISTORY — DX: Malignant (primary) neoplasm, unspecified: C80.1

## 2019-03-15 LAB — CBC
HCT: 46 % (ref 36.0–46.0)
Hemoglobin: 15.7 g/dL — ABNORMAL HIGH (ref 12.0–15.0)
MCH: 32.1 pg (ref 26.0–34.0)
MCHC: 34.1 g/dL (ref 30.0–36.0)
MCV: 94.1 fL (ref 80.0–100.0)
Platelets: 400 10*3/uL (ref 150–400)
RBC: 4.89 MIL/uL (ref 3.87–5.11)
RDW: 13.1 % (ref 11.5–15.5)
WBC: 8.7 10*3/uL (ref 4.0–10.5)
nRBC: 0 % (ref 0.0–0.2)

## 2019-03-15 LAB — BLOOD GAS, VENOUS
Acid-Base Excess: 2.7 mmol/L — ABNORMAL HIGH (ref 0.0–2.0)
Bicarbonate: 27.9 mmol/L (ref 20.0–28.0)
O2 Saturation: 71.3 %
Patient temperature: 37
pCO2, Ven: 44 mmHg (ref 44.0–60.0)
pH, Ven: 7.41 (ref 7.250–7.430)
pO2, Ven: 37 mmHg (ref 32.0–45.0)

## 2019-03-15 LAB — COMPREHENSIVE METABOLIC PANEL
ALT: 17 U/L (ref 0–44)
AST: 18 U/L (ref 15–41)
Albumin: 4.3 g/dL (ref 3.5–5.0)
Alkaline Phosphatase: 82 U/L (ref 38–126)
Anion gap: 19 — ABNORMAL HIGH (ref 5–15)
BUN: 20 mg/dL (ref 6–20)
CO2: 22 mmol/L (ref 22–32)
Calcium: 9.6 mg/dL (ref 8.9–10.3)
Chloride: 93 mmol/L — ABNORMAL LOW (ref 98–111)
Creatinine, Ser: 0.91 mg/dL (ref 0.44–1.00)
GFR calc Af Amer: >60 mL/min (ref >60)
GFR calc non Af Amer: >60 mL/min (ref >60)
Glucose, Bld: 494 mg/dL — ABNORMAL HIGH (ref 70–99)
Potassium: 4.1 mmol/L (ref 3.5–5.1)
Sodium: 134 mmol/L — ABNORMAL LOW (ref 135–145)
Total Bilirubin: 1.3 mg/dL — ABNORMAL HIGH (ref 0.3–1.2)
Total Protein: 8.7 g/dL — ABNORMAL HIGH (ref 6.5–8.1)

## 2019-03-15 LAB — GLUCOSE, CAPILLARY
Glucose-Capillary: 266 mg/dL — ABNORMAL HIGH (ref 70–99)
Glucose-Capillary: 396 mg/dL — ABNORMAL HIGH (ref 70–99)

## 2019-03-15 LAB — LIPASE, BLOOD: Lipase: 29 U/L (ref 11–51)

## 2019-03-15 LAB — TROPONIN I (HIGH SENSITIVITY): Troponin I (High Sensitivity): 9 ng/L (ref <18)

## 2019-03-15 MED ORDER — IOHEXOL 9 MG/ML PO SOLN
500.0000 mL | Freq: Two times a day (BID) | ORAL | Status: DC | PRN
  Administered 2019-03-15: 22:00:00 500 mL via ORAL

## 2019-03-15 MED ORDER — INSULIN ASPART 100 UNIT/ML ~~LOC~~ SOLN
10.0000 [IU] | Freq: Once | SUBCUTANEOUS | Status: AC
  Administered 2019-03-15: 22:00:00 10 [IU] via SUBCUTANEOUS
  Filled 2019-03-15: qty 1

## 2019-03-15 MED ORDER — PROMETHAZINE HCL 25 MG/ML IJ SOLN
12.5000 mg | Freq: Once | INTRAMUSCULAR | Status: AC
  Administered 2019-03-15: 12.5 mg via INTRAVENOUS
  Filled 2019-03-15: qty 1

## 2019-03-15 MED ORDER — ONDANSETRON 4 MG PO TBDP
4.0000 mg | ORAL_TABLET | Freq: Once | ORAL | Status: AC | PRN
  Administered 2019-03-15: 20:00:00 4 mg via ORAL
  Filled 2019-03-15: qty 1

## 2019-03-15 MED ORDER — SODIUM CHLORIDE 0.9 % IV BOLUS
1000.0000 mL | Freq: Once | INTRAVENOUS | Status: AC
  Administered 2019-03-15: 1000 mL via INTRAVENOUS
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
## 2019-03-15 NOTE — ED Notes (Signed)
Warm blanket provided per patient request

## 2019-03-15 NOTE — ED Notes (Signed)
First nurse note: pt assisted out of car into wheelchair. Pt complains of nausea today. Denies pain, shob, cough. Pt ambulatory to wheelchair without difficulty.

## 2019-03-15 NOTE — ED Triage Notes (Signed)
Patient with complaint of vomiting that started last night. Patient states that she is unsure how many times that she has vomited. Patient denies diarrhea. Patient with complaint of some generalized abdominal pain from vomiting. Patient with complaint of lower back pain. Patient denies pain with urination.

## 2019-03-15 NOTE — ED Provider Notes (Signed)
-----------------------------------------   11:09 PM on 03/15/2019 -----------------------------------------  Blood pressure (!) 158/103, pulse 79, temperature 98.4 F (36.9 C), temperature source Oral, resp. rate (!) 23, height 5' (1.524 m), weight 54.4 kg, SpO2 (!) 86 %.  Assuming care from Dr. Darl Householder.  In short, Brittany [name] is a 59 y.o. female with a chief complaint of Emesis .  Refer to the original H&P for additional details.  The current plan of care is to follow-up CT abdomen results for diffuse pain and vomiting without diarrhea. Labs thus far significant only for hyperglycemia with no acidosis, will check repeat CMP to ensure gap closing.  Repeat CMP is reassuring, glucose is improved and anion gap now closed.  CT scan significant for colitis, likely viral in origin.  This can be treated symptomatically with Zofran and patient was counseled to follow-up with her PCP.  Patient was also advised of findings on CT scan regarding her pancreas and she will follow-up with her PCP for this.  I have counseled her to return to the ED for new or worsening symptoms, patient agrees with plan.    Blake Divine, MD 03/16/19 [phone]

## 2019-03-16 ENCOUNTER — Encounter: Payer: Self-pay | Admitting: Radiology

## 2019-03-16 ENCOUNTER — Emergency Department: Payer: Medicare Other

## 2019-03-16 LAB — COMPREHENSIVE METABOLIC PANEL
ALT: 16 U/L (ref 0–44)
AST: 22 U/L (ref 15–41)
Albumin: 3.8 g/dL (ref 3.5–5.0)
Alkaline Phosphatase: 72 U/L (ref 38–126)
Anion gap: 14 (ref 5–15)
BUN: 19 mg/dL (ref 6–20)
CO2: 24 mmol/L (ref 22–32)
Calcium: 9 mg/dL (ref 8.9–10.3)
Chloride: 98 mmol/L (ref 98–111)
Creatinine, Ser: 0.82 mg/dL (ref 0.44–1.00)
GFR calc Af Amer: >60 mL/min (ref >60)
GFR calc non Af Amer: >60 mL/min (ref >60)
Glucose, Bld: 254 mg/dL — ABNORMAL HIGH (ref 70–99)
Potassium: 3.6 mmol/L (ref 3.5–5.1)
Sodium: 136 mmol/L (ref 135–145)
Total Bilirubin: 1 mg/dL (ref 0.3–1.2)
Total Protein: 7.9 g/dL (ref 6.5–8.1)

## 2019-03-16 MED ORDER — GABAPENTIN 300 MG PO CAPS
1200.0000 mg | ORAL_CAPSULE | Freq: Once | ORAL | Status: AC
  Administered 2019-03-16: 1200 mg via ORAL
  Filled 2019-03-16: qty 4

## 2019-03-16 MED ORDER — ONDANSETRON 4 MG PO TBDP: 4.0000 mg | ORAL_TABLET | Freq: Three times a day (TID) | ORAL | 0 refills | Status: DC | PRN

## 2019-03-16 MED ORDER — IOHEXOL 300 MG/ML  SOLN
75.0000 mL | Freq: Once | INTRAMUSCULAR | Status: AC | PRN
  Administered 2019-03-16: 75 mL via INTRAVENOUS

## 2019-03-16 NOTE — ED Notes (Signed)
Pt taken to CT at this time.

## 2019-03-16 NOTE — Discharge Instructions (Signed)
Your CT scan today showed inflammation around your colon, which is also known as colitis.  This is most commonly caused by a viral infection.  We will prescribe you Zofran that can help with your nausea and vomiting, but if you continue to have problems eating and drinking or increasing pain, please return to the ER for reevaluation.  Your CT scan today also showed an abnormal portion of your pancreas, which was similar on your last imaging in 2018.  Please discuss this with your primary care doctor to determine if you will need an MRI.

## 2019-03-27 ENCOUNTER — Other Ambulatory Visit: Payer: Self-pay | Admitting: Student

## 2019-03-27 DIAGNOSIS — M5412 Radiculopathy, cervical region: Secondary | ICD-10-CM

## 2019-03-27 DIAGNOSIS — Z859 Personal history of malignant neoplasm, unspecified: Secondary | ICD-10-CM

## 2019-03-27 DIAGNOSIS — M542 Cervicalgia: Secondary | ICD-10-CM

## 2019-04-10 ENCOUNTER — Other Ambulatory Visit: Payer: Self-pay

## 2019-04-10 ENCOUNTER — Ambulatory Visit
Admission: RE | Admit: 2019-04-10 | Discharge: 2019-04-10 | Disposition: A | Discharge status: Home / Self Care | Payer: Medicare Other | Source: Ambulatory Visit | Attending: Student | Admitting: Student

## 2019-04-10 DIAGNOSIS — Z859 Personal history of malignant neoplasm, unspecified: Secondary | ICD-10-CM | POA: Diagnosis present

## 2019-04-10 DIAGNOSIS — M542 Cervicalgia: Secondary | ICD-10-CM | POA: Insufficient documentation

## 2019-04-10 DIAGNOSIS — M5412 Radiculopathy, cervical region: Secondary | ICD-10-CM | POA: Diagnosis not present

## 2019-04-10 MED ORDER — GADOBUTROL 1 MMOL/ML IV SOLN
5.0000 mL | Freq: Once | INTRAVENOUS | Status: AC | PRN
  Administered 2019-04-10: 12:00:00 5 mL via INTRAVENOUS

## 2019-05-25 ENCOUNTER — Other Ambulatory Visit: Payer: Self-pay

## 2019-05-25 ENCOUNTER — Ambulatory Visit: Payer: Medicare Other | Attending: Internal Medicine

## 2019-05-25 DIAGNOSIS — Z23 Encounter for immunization: Secondary | ICD-10-CM

## 2019-05-25 NOTE — Progress Notes (Signed)
   Covid-19 Vaccination Clinic  Name:  Brittany [name]    MRN: [id number] DOB: 1960/02/15  05/25/2019  [name] was observed post Covid-19 immunization for 15 minutes without incident. She was provided with Vaccine Information Sheet and instruction to access the V-Safe system.   [name] was instructed to call 911 with any severe reactions post vaccine: Marland Kitchen Difficulty breathing  . Swelling of face and throat  . A fast heartbeat  . A bad rash all over body  . Dizziness and weakness   Immunizations Administered    Name Date Dose VIS Date Route   Pfizer COVID-19 Vaccine 05/25/2019  9:29 AM 0.3 mL 01/23/2019 Intramuscular   Manufacturer: [place]   Lot: [id number]   [place]: [id number]

## 2019-05-28 ENCOUNTER — Other Ambulatory Visit: Payer: Self-pay

## 2019-05-28 ENCOUNTER — Observation Stay
Admission: EM | Admit: 2019-05-28 | Discharge: 2019-05-29 | Disposition: A | Discharge status: Home / Self Care | Payer: Medicare Other | Attending: Internal Medicine | Admitting: Internal Medicine

## 2019-05-28 DIAGNOSIS — Z885 Allergy status to narcotic agent status: Secondary | ICD-10-CM | POA: Insufficient documentation

## 2019-05-28 DIAGNOSIS — I10 Essential (primary) hypertension: Secondary | ICD-10-CM | POA: Diagnosis not present

## 2019-05-28 DIAGNOSIS — E1110 Type 2 diabetes mellitus with ketoacidosis without coma: Principal | ICD-10-CM | POA: Insufficient documentation

## 2019-05-28 DIAGNOSIS — Z20822 Contact with and (suspected) exposure to covid-19: Secondary | ICD-10-CM | POA: Insufficient documentation

## 2019-05-28 DIAGNOSIS — J449 Chronic obstructive pulmonary disease, unspecified: Secondary | ICD-10-CM | POA: Diagnosis not present

## 2019-05-28 DIAGNOSIS — Z791 Long term (current) use of non-steroidal anti-inflammatories (NSAID): Secondary | ICD-10-CM | POA: Insufficient documentation

## 2019-05-28 DIAGNOSIS — I160 Hypertensive urgency: Secondary | ICD-10-CM | POA: Diagnosis not present

## 2019-05-28 DIAGNOSIS — T881XXA Other complications following immunization, not elsewhere classified, initial encounter: Secondary | ICD-10-CM | POA: Insufficient documentation

## 2019-05-28 DIAGNOSIS — M329 Systemic lupus erythematosus, unspecified: Secondary | ICD-10-CM | POA: Diagnosis not present

## 2019-05-28 DIAGNOSIS — K861 Other chronic pancreatitis: Secondary | ICD-10-CM | POA: Diagnosis not present

## 2019-05-28 DIAGNOSIS — F17200 Nicotine dependence, unspecified, uncomplicated: Secondary | ICD-10-CM | POA: Diagnosis not present

## 2019-05-28 DIAGNOSIS — G2581 Restless legs syndrome: Secondary | ICD-10-CM | POA: Insufficient documentation

## 2019-05-28 DIAGNOSIS — Z881 Allergy status to other antibiotic agents status: Secondary | ICD-10-CM | POA: Insufficient documentation

## 2019-05-28 DIAGNOSIS — E860 Dehydration: Secondary | ICD-10-CM | POA: Diagnosis not present

## 2019-05-28 DIAGNOSIS — Z794 Long term (current) use of insulin: Secondary | ICD-10-CM | POA: Insufficient documentation

## 2019-05-28 DIAGNOSIS — Z79899 Other long term (current) drug therapy: Secondary | ICD-10-CM | POA: Diagnosis not present

## 2019-05-28 DIAGNOSIS — Z9081 Acquired absence of spleen: Secondary | ICD-10-CM | POA: Insufficient documentation

## 2019-05-28 DIAGNOSIS — K219 Gastro-esophageal reflux disease without esophagitis: Secondary | ICD-10-CM | POA: Insufficient documentation

## 2019-05-28 DIAGNOSIS — Z90410 Acquired total absence of pancreas: Secondary | ICD-10-CM | POA: Insufficient documentation

## 2019-05-28 DIAGNOSIS — T50Z95A Adverse effect of other vaccines and biological substances, initial encounter: Secondary | ICD-10-CM

## 2019-05-28 DIAGNOSIS — E0810 Diabetes mellitus due to underlying condition with ketoacidosis without coma: Secondary | ICD-10-CM

## 2019-05-28 LAB — COMPREHENSIVE METABOLIC PANEL
ALT: 18 U/L (ref 0–44)
AST: 16 U/L (ref 15–41)
Albumin: 4 g/dL (ref 3.5–5.0)
Alkaline Phosphatase: 76 U/L (ref 38–126)
Anion gap: 20 — ABNORMAL HIGH (ref 5–15)
BUN: 28 mg/dL — ABNORMAL HIGH (ref 6–20)
CO2: 14 mmol/L — ABNORMAL LOW (ref 22–32)
Calcium: 9.5 mg/dL (ref 8.9–10.3)
Chloride: 90 mmol/L — ABNORMAL LOW (ref 98–111)
Creatinine, Ser: 0.97 mg/dL (ref 0.44–1.00)
GFR calc Af Amer: >60 mL/min (ref >60)
GFR calc non Af Amer: >60 mL/min (ref >60)
Glucose, Bld: 508 mg/dL (ref 70–99)
Potassium: 4.5 mmol/L (ref 3.5–5.1)
Sodium: 124 mmol/L — ABNORMAL LOW (ref 135–145)
Total Bilirubin: 2.5 mg/dL — ABNORMAL HIGH (ref 0.3–1.2)
Total Protein: 8.4 g/dL — ABNORMAL HIGH (ref 6.5–8.1)

## 2019-05-28 LAB — CBC
HCT: 46.7 % — ABNORMAL HIGH (ref 36.0–46.0)
Hemoglobin: 16 g/dL — ABNORMAL HIGH (ref 12.0–15.0)
MCH: 32.1 pg (ref 26.0–34.0)
MCHC: 34.3 g/dL (ref 30.0–36.0)
MCV: 93.6 fL (ref 80.0–100.0)
Platelets: 435 10*3/uL — ABNORMAL HIGH (ref 150–400)
RBC: 4.99 MIL/uL (ref 3.87–5.11)
RDW: 12.3 % (ref 11.5–15.5)
WBC: 7.2 10*3/uL (ref 4.0–10.5)
nRBC: 0 % (ref 0.0–0.2)

## 2019-05-28 LAB — GLUCOSE, CAPILLARY
Glucose-Capillary: 142 mg/dL — ABNORMAL HIGH (ref 70–99)
Glucose-Capillary: 175 mg/dL — ABNORMAL HIGH (ref 70–99)
Glucose-Capillary: 176 mg/dL — ABNORMAL HIGH (ref 70–99)
Glucose-Capillary: 184 mg/dL — ABNORMAL HIGH (ref 70–99)
Glucose-Capillary: 202 mg/dL — ABNORMAL HIGH (ref 70–99)
Glucose-Capillary: 379 mg/dL — ABNORMAL HIGH (ref 70–99)
Glucose-Capillary: 463 mg/dL — ABNORMAL HIGH (ref 70–99)

## 2019-05-28 LAB — BASIC METABOLIC PANEL
Anion gap: 14 (ref 5–15)
Anion gap: 10 (ref 5–15)
BUN: 22 mg/dL — ABNORMAL HIGH (ref 6–20)
BUN: 20 mg/dL (ref 6–20)
CO2: 19 mmol/L — ABNORMAL LOW (ref 22–32)
CO2: 22 mmol/L (ref 22–32)
Calcium: 8.7 mg/dL — ABNORMAL LOW (ref 8.9–10.3)
Calcium: 8.7 mg/dL — ABNORMAL LOW (ref 8.9–10.3)
Chloride: 100 mmol/L (ref 98–111)
Chloride: 100 mmol/L (ref 98–111)
Creatinine, Ser: 0.73 mg/dL (ref 0.44–1.00)
Creatinine, Ser: 0.6 mg/dL (ref 0.44–1.00)
GFR calc Af Amer: >60 mL/min (ref >60)
GFR calc Af Amer: >60 mL/min (ref >60)
GFR calc non Af Amer: >60 mL/min (ref >60)
GFR calc non Af Amer: >60 mL/min (ref >60)
Glucose, Bld: 204 mg/dL — ABNORMAL HIGH (ref 70–99)
Glucose, Bld: 186 mg/dL — ABNORMAL HIGH (ref 70–99)
Potassium: 4 mmol/L (ref 3.5–5.1)
Potassium: 3.7 mmol/L (ref 3.5–5.1)
Sodium: 133 mmol/L — ABNORMAL LOW (ref 135–145)
Sodium: 132 mmol/L — ABNORMAL LOW (ref 135–145)

## 2019-05-28 LAB — HEMOGLOBIN A1C
Hgb A1c MFr Bld: 12.5 % — ABNORMAL HIGH (ref 4.8–5.6)
Mean Plasma Glucose: 312.05 mg/dL

## 2019-05-28 LAB — CK: Total CK: 48 U/L (ref 38–234)

## 2019-05-28 LAB — BETA-HYDROXYBUTYRIC ACID: Beta-Hydroxybutyric Acid: >8 mmol/L — ABNORMAL HIGH (ref 0.05–0.27)

## 2019-05-28 LAB — LIPASE, BLOOD: Lipase: 69 U/L — ABNORMAL HIGH (ref 11–51)

## 2019-05-28 LAB — RESPIRATORY PANEL BY RT PCR (FLU A&B, COVID)
Influenza A by PCR: NEGATIVE
Influenza B by PCR: NEGATIVE
SARS Coronavirus 2 by RT PCR: NEGATIVE

## 2019-05-28 MED ORDER — HYDROXYZINE HCL 10 MG PO TABS
10.0000 mg | ORAL_TABLET | Freq: Two times a day (BID) | ORAL | Status: DC
  Administered 2019-05-28 – 2019-05-29 (×2): 10 mg via ORAL
  Filled 2019-05-28 (×3): qty 1

## 2019-05-28 MED ORDER — INSULIN REGULAR(HUMAN) IN NACL 100-0.9 UT/100ML-% IV SOLN
INTRAVENOUS | Status: DC
  Administered 2019-05-28: 9 [IU]/h via INTRAVENOUS
  Filled 2019-05-28: qty 100

## 2019-05-28 MED ORDER — HYDROXYCHLOROQUINE SULFATE 200 MG PO TABS
200.0000 mg | ORAL_TABLET | Freq: Every day | ORAL | Status: DC
  Administered 2019-05-29: 200 mg via ORAL
  Filled 2019-05-28: qty 1

## 2019-05-28 MED ORDER — ROPINIROLE HCL 0.25 MG PO TABS
0.5000 mg | ORAL_TABLET | Freq: Every day | ORAL | Status: DC
  Administered 2019-05-28: 0.5 mg via ORAL
  Filled 2019-05-28 (×2): qty 2

## 2019-05-28 MED ORDER — KETOROLAC TROMETHAMINE 30 MG/ML IJ SOLN: 30.0000 mg | Freq: Once | INTRAMUSCULAR | Status: DC

## 2019-05-28 MED ORDER — LORAZEPAM 2 MG/ML IJ SOLN
1.0000 mg | Freq: Once | INTRAMUSCULAR | Status: AC
  Administered 2019-05-28: 1 mg via INTRAVENOUS
  Filled 2019-05-28: qty 1

## 2019-05-28 MED ORDER — AMLODIPINE BESYLATE 5 MG PO TABS: 5.0000 mg | ORAL_TABLET | Freq: Every day | ORAL | Status: DC

## 2019-05-28 MED ORDER — SODIUM CHLORIDE 0.9% FLUSH
3.0000 mL | Freq: Once | INTRAVENOUS | Status: AC
  Administered 2019-05-28: 3 mL via INTRAVENOUS

## 2019-05-28 MED ORDER — SODIUM CHLORIDE 0.9 % IV SOLN: Freq: Once | INTRAVENOUS | Status: AC

## 2019-05-28 MED ORDER — SODIUM CHLORIDE 0.9 % IV SOLN: INTRAVENOUS | Status: DC

## 2019-05-28 MED ORDER — ONDANSETRON HCL 4 MG/2ML IJ SOLN
4.0000 mg | Freq: Once | INTRAMUSCULAR | Status: AC
  Administered 2019-05-28: 4 mg via INTRAVENOUS
  Filled 2019-05-28: qty 2

## 2019-05-28 MED ORDER — ATORVASTATIN CALCIUM 20 MG PO TABS
80.0000 mg | ORAL_TABLET | Freq: Every day | ORAL | Status: DC
  Administered 2019-05-28: 80 mg via ORAL
  Filled 2019-05-28: qty 4

## 2019-05-28 MED ORDER — MIRTAZAPINE 15 MG PO TABS
15.0000 mg | ORAL_TABLET | Freq: Every day | ORAL | Status: DC
  Administered 2019-05-28: 15 mg via ORAL
  Filled 2019-05-28: qty 1

## 2019-05-28 MED ORDER — GABAPENTIN 600 MG PO TABS
1200.0000 mg | ORAL_TABLET | Freq: Every day | ORAL | Status: DC
  Administered 2019-05-28: 1200 mg via ORAL
  Filled 2019-05-28 (×3): qty 2

## 2019-05-28 MED ORDER — DEXTROSE-NACL 5-0.45 % IV SOLN: INTRAVENOUS | Status: DC

## 2019-05-28 MED ORDER — ENOXAPARIN SODIUM 40 MG/0.4ML ~~LOC~~ SOLN
40.0000 mg | SUBCUTANEOUS | Status: DC
  Administered 2019-05-28: 40 mg via SUBCUTANEOUS
  Filled 2019-05-28: qty 0.4

## 2019-05-28 MED ORDER — BENAZEPRIL HCL 10 MG PO TABS
10.0000 mg | ORAL_TABLET | Freq: Every day | ORAL | Status: DC
  Administered 2019-05-28 – 2019-05-29 (×2): 10 mg via ORAL
  Filled 2019-05-28 (×2): qty 1

## 2019-05-28 MED ORDER — DEXTROSE 50 % IV SOLN: 0.0000 mL | INTRAVENOUS | Status: DC | PRN

## 2019-05-28 MED ORDER — INSULIN REGULAR(HUMAN) IN NACL 100-0.9 UT/100ML-% IV SOLN: INTRAVENOUS | Status: DC

## 2019-05-28 MED ORDER — ONDANSETRON HCL 4 MG/2ML IJ SOLN: 4.0000 mg | Freq: Four times a day (QID) | INTRAMUSCULAR | Status: DC | PRN

## 2019-05-28 MED ORDER — ACETAMINOPHEN 325 MG PO TABS: 650.0000 mg | ORAL_TABLET | Freq: Four times a day (QID) | ORAL | Status: DC | PRN

## 2019-05-28 MED ORDER — BUPROPION HCL ER (XL) 300 MG PO TB24
300.0000 mg | ORAL_TABLET | Freq: Every day | ORAL | Status: DC
  Administered 2019-05-29: 300 mg via ORAL
  Filled 2019-05-28: qty 1

## 2019-05-28 MED ORDER — POTASSIUM CHLORIDE 10 MEQ/100ML IV SOLN
10.0000 meq | INTRAVENOUS | Status: AC
  Administered 2019-05-28: 10 meq via INTRAVENOUS
  Filled 2019-05-28: qty 100

## 2019-05-28 MED ORDER — HYDRALAZINE HCL 20 MG/ML IJ SOLN: 10.0000 mg | Freq: Four times a day (QID) | INTRAMUSCULAR | Status: DC | PRN

## 2019-05-28 NOTE — H&P (Signed)
History and Physical:    Brittany [name]   [id number] DOB: [birth year] DOA: 05/28/2019  Referring MD/provider: Lenise Arena, MD PCP: Clinic, [facility]   Patient coming from: Home  Chief Complaint: Vomiting  History of Present Illness:   Brittany [name] is an 59 y.o. female with medical history significant for diabetes mellitus, hypertension, lupus, who presented to the hospital with nausea and vomiting.  She said she had a Covid vaccine on 05/25/2019 and the following day she started feeling sick.  She said she had nausea, vomiting, headache and generalized body aches.  She had not been able to keep anything down until today when she was able to drink some ginger ale.  She said she did not take her insulin on the day of vaccination because she did not wanted to interact with the vaccine.  She also did not take any insulin in the last 2 days but she took some insulin today.  She had no diarrhea but had some abdominal pain.  ED Course:  The patient was significantly hypertensive and was found to have DKA she was started on IV insulin infusion and IV fluids.  She was also given IV Zofran and IV lorazepam.  ROS:   ROS all other systems reviewed were negative  Past Medical History:   Past Medical History:  Diagnosis Date  . Cancer ([place])   . Collagen vascular disease ([place])   . Diabetes mellitus without complication ([place])   . Hypertension   . Lupus [facility])     Past Surgical History:   Past Surgical History:  Procedure Laterality Date  . PANCREATECTOMY    . spleenectomy      Social History:   Social History   Socioeconomic History  . Marital status: Widowed    Spouse name: Not on file  . Number of children: Not on file  . Years of education: Not on file  . Highest education level: Not on file  Occupational History  . Not on file  Tobacco Use  . Smoking status: Current Every Day Smoker  . Smokeless tobacco: Never Used  Substance and Sexual Activity  .  Alcohol use: Yes  . Drug use: Not Currently  . Sexual activity: Not on file  Other Topics Concern  . Not on file  Social History Narrative  . Not on file   Social Determinants of Health   Financial Resource Strain:   . Difficulty of Paying Living Expenses:   Food Insecurity:   . Worried About Charity fundraiser in the Last Year:   . Arboriculturist in the Last Year:   Transportation Needs:   . Film/video editor (Medical):   Marland Kitchen Lack of Transportation (Non-Medical):   Physical Activity:   . Days of Exercise per Week:   . Minutes of Exercise per Session:   Stress:   . Feeling of Stress :   Social Connections:   . Frequency of Communication with Friends and Family:   . Frequency of Social Gatherings with Friends and Family:   . Attends Religious Services:   . Active Member of Clubs or Organizations:   . Attends Archivist Meetings:   Marland Kitchen Marital Status:   Intimate Partner Violence:   . Fear of Current or Ex-Partner:   . Emotionally Abused:   Marland Kitchen Physically Abused:   . Sexually Abused:     Allergies   Cephalosporins, Hydromorphone, and Keflin [cephalothin]  Family history:   History reviewed. No pertinent  family history.  She does not report any family history of diabetes  Current Medications:   Prior to Admission medications   Medication Sig Start Date End Date Taking? Authorizing Provider  atorvastatin (LIPITOR) 80 MG tablet Take 80 mg by mouth Nightly.  05/26/18 05/28/19 Yes [provider]  benazepril (LOTENSIN) 10 MG tablet Take 10 mg by mouth daily. 08/31/17  Yes [provider]  buPROPion (WELLBUTRIN XL) 300 MG 24 hr tablet Take 300 mg by mouth daily. 12/19/17 05/28/19 Yes [provider]  folic acid (FOLVITE) 1 MG tablet Take 1 mg by mouth daily. 09/13/17  Yes [provider]  gabapentin (NEURONTIN) 600 MG tablet Take 1,200 mg by mouth at bedtime. 07/03/18  Yes [provider]  hydroxychloroquine (PLAQUENIL) 200  MG tablet Take 200 mg by mouth daily.  05/26/18  Yes [provider]  hydrOXYzine (ATARAX/VISTARIL) 10 MG tablet Take 10 mg by mouth 2 (two) times daily.  12/31/16  Yes [provider]  Insulin Glargine, 1 Unit Dial, 300 UNIT/ML SOPN Inject 12 Units into the skin every morning. 05/21/18  Yes [provider]  meloxicam (MOBIC) 15 MG tablet Take 15 mg by mouth daily. 09/03/17  Yes [provider]  metFORMIN (GLUCOPHAGE) 1000 MG tablet Take 1,000 mg by mouth daily with breakfast.  08/08/17 05/28/19 Yes [provider]  mirtazapine (REMERON) 15 MG tablet Take 15 mg by mouth at bedtime. 07/03/18 07/03/19 Yes [provider]  Pancrelipase, Lip-Prot-Amyl, 24000-76000 units CPEP Take 3 capsules by mouth 3 (three) times daily. 05/26/18  Yes [provider]  rOPINIRole (REQUIP) 0.5 MG tablet Take 0.5 mg by mouth at bedtime. 05/26/18  Yes [provider]  ondansetron (ZOFRAN ODT) 4 MG disintegrating tablet Take 1 tablet (4 mg total) by mouth every 8 (eight) hours as needed for nausea or vomiting. Patient not taking: Reported on 05/28/2019 03/16/19   Blake Divine, MD    Physical Exam:   Vitals:   05/28/19 1700 05/28/19 1734 05/28/19 1755 05/28/19 1828  BP: (!) 178/106 (!) 173/114 (!) 112/99 (!) 121/94  Pulse: 79 98  96  Resp: 13 20 19 19   Temp:      TempSrc:      SpO2: 100%   99%  Weight:      Height:         Physical Exam: Blood pressure (!) 121/94, pulse 96, temperature 98 F (36.7 C), temperature source Oral, resp. rate 19, height 5' (1.524 m), weight 45.4 kg, SpO2 99 %. Gen: No acute distress. Head: Normocephalic, atraumatic. Eyes: Pupils equal, round and reactive to light. Extraocular movements intact.  Sclerae nonicteric.  Mouth: Dry mucous membranes Neck: Supple, no thyromegaly, no lymphadenopathy, no jugular venous distention. Chest: Lungs are clear to auscultation with good air movement. No rales, rhonchi or wheezes.  CV:  Heart sounds are regular with an S1, S2. No murmurs, rubs or gallops.  Abdomen: Soft, nontender, nondistended with normal active bowel sounds. No palpable masses. Extremities: Extremities are without clubbing, or cyanosis. No edema. Pedal pulses 2+.  Skin: Warm and dry. No rashes, lesions or wounds Neuro: Alert and oriented times 3; grossly nonfocal.  Psych: Insight is good and judgment is appropriate. Mood and affect normal.   Data Review:    Labs: Basic Metabolic Panel: Recent Labs  Lab 05/28/19 1335  NA 124*  K 4.5  CL 90*  CO2 14*  GLUCOSE 508*  BUN 28*  CREATININE 0.97  CALCIUM 9.5   Liver Function  Tests: Recent Labs  Lab 05/28/19 1335  AST 16  ALT 18  ALKPHOS 76  BILITOT 2.5*  PROT 8.4*  ALBUMIN 4.0   Recent Labs  Lab 05/28/19 1335  LIPASE 69*   No results for input(s): AMMONIA in the last 168 hours. CBC: Recent Labs  Lab 05/28/19 1335  WBC 7.2  HGB 16.0*  HCT 46.7*  MCV 93.6  PLT 435*   Cardiac Enzymes: Recent Labs  Lab 05/28/19 1335  CKTOTAL 48    BNP (last 3 results) No results for input(s): PROBNP in the last 8760 hours. CBG: Recent Labs  Lab 05/28/19 1650 05/28/19 1732  GLUCAP 463* 379*    Urinalysis    Component Value Date/Time   COLORURINE YELLOW (A) 10/17/2018 1840   APPEARANCEUR CLEAR (A) 10/17/2018 1840   APPEARANCEUR Hazy 04/04/2011 1935   LABSPEC 1.031 (H) 10/17/2018 1840   LABSPEC >1.060 04/04/2011 1935   PHURINE 5.0 10/17/2018 1840   GLUCOSEU >=500 (A) 10/17/2018 1840   GLUCOSEU 50 mg/dL 04/04/2011 1935   HGBUR SMALL (A) 10/17/2018 1840   BILIRUBINUR NEGATIVE 10/17/2018 1840   BILIRUBINUR Negative 04/04/2011 1935   KETONESUR 80 (A) 10/17/2018 1840   PROTEINUR 30 (A) 10/17/2018 1840   NITRITE POSITIVE (A) 10/17/2018 1840   LEUKOCYTESUR NEGATIVE 10/17/2018 1840   LEUKOCYTESUR Negative 04/04/2011 1935      Radiographic Studies: No results found.    Assessment/Plan:   Principal Problem:   DKA (diabetic  ketoacidoses) (HCC) Active Problems:   Hypertensive urgency   DKA: Admit to stepdown unit.  Treat with IV insulin infusion and IV fluids.  Dehydration: Treat with IV fluids  Hypertensive urgency: Continue benazepril.  Add amlodipine for adequate BP control.  IV hydralazine for severe hypertension.  Chronic pancreatitis: Continue Creon when able  Restless leg syndrome: Continue ropinirole  Body mass index is 19.53 kg/m.  Other information:   DVT prophylaxis: Lovenox. Code Status: Full code. Family Communication: Plan discussed with patient Disposition Plan: Possible discharge to home in 2 days Consults called: None Admission status: Inpatient  The medical decision making on this patient was of high complexity and the patient is at high risk for clinical deterioration, therefore this is a level 3 visit.    Time spent 70 minutes  [facility]   How to contact the [facility] Attending or Consulting provider [place] or covering provider during after hours [place], for this patient?   1. Check the care team in [facility] and look for a) attending/consulting TRH provider listed and b) the [facility] team listed 2. Log into [email or web address] and use [place]'s universal password to access. If you do not have the password, please contact the hospital operator. 3. Locate the [facility] provider you are looking for under [facility] and page to a number that you can be directly reached. 4. If you still have difficulty reaching the provider, please page the [facility] (Director on Call) for the Hospitalists listed on amion for assistance.  05/28/2019, 6:35 PM
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
## 2019-05-28 NOTE — ED Notes (Signed)
Attempted to call report

## 2019-05-28 NOTE — ED Triage Notes (Signed)
Pt states she received her 1st covid vaccine on Monday and since Tuesday is having HA, body aches with N/V.Marland Kitchen

## 2019-05-29 DIAGNOSIS — I160 Hypertensive urgency: Secondary | ICD-10-CM | POA: Diagnosis not present

## 2019-05-29 DIAGNOSIS — E1110 Type 2 diabetes mellitus with ketoacidosis without coma: Secondary | ICD-10-CM | POA: Diagnosis not present

## 2019-05-29 LAB — BASIC METABOLIC PANEL
Anion gap: 6 (ref 5–15)
Anion gap: 5 (ref 5–15)
Anion gap: 10 (ref 5–15)
BUN: 17 mg/dL (ref 6–20)
BUN: 17 mg/dL (ref 6–20)
BUN: 19 mg/dL (ref 6–20)
CO2: 26 mmol/L (ref 22–32)
CO2: 25 mmol/L (ref 22–32)
CO2: 21 mmol/L — ABNORMAL LOW (ref 22–32)
Calcium: 9 mg/dL (ref 8.9–10.3)
Calcium: 8.9 mg/dL (ref 8.9–10.3)
Calcium: 8.5 mg/dL — ABNORMAL LOW (ref 8.9–10.3)
Chloride: 107 mmol/L (ref 98–111)
Chloride: 108 mmol/L (ref 98–111)
Chloride: 101 mmol/L (ref 98–111)
Creatinine, Ser: 0.5 mg/dL (ref 0.44–1.00)
Creatinine, Ser: 0.65 mg/dL (ref 0.44–1.00)
Creatinine, Ser: 0.69 mg/dL (ref 0.44–1.00)
GFR calc Af Amer: >60 mL/min (ref >60)
GFR calc Af Amer: >60 mL/min (ref >60)
GFR calc Af Amer: >60 mL/min (ref >60)
GFR calc non Af Amer: >60 mL/min (ref >60)
GFR calc non Af Amer: >60 mL/min (ref >60)
GFR calc non Af Amer: >60 mL/min (ref >60)
Glucose, Bld: 161 mg/dL — ABNORMAL HIGH (ref 70–99)
Glucose, Bld: 123 mg/dL — ABNORMAL HIGH (ref 70–99)
Glucose, Bld: 396 mg/dL — ABNORMAL HIGH (ref 70–99)
Potassium: 3.7 mmol/L (ref 3.5–5.1)
Potassium: 4 mmol/L (ref 3.5–5.1)
Potassium: 4 mmol/L (ref 3.5–5.1)
Sodium: 139 mmol/L (ref 135–145)
Sodium: 138 mmol/L (ref 135–145)
Sodium: 132 mmol/L — ABNORMAL LOW (ref 135–145)

## 2019-05-29 LAB — GLUCOSE, CAPILLARY
Glucose-Capillary: 127 mg/dL — ABNORMAL HIGH (ref 70–99)
Glucose-Capillary: 135 mg/dL — ABNORMAL HIGH (ref 70–99)
Glucose-Capillary: 146 mg/dL — ABNORMAL HIGH (ref 70–99)
Glucose-Capillary: 153 mg/dL — ABNORMAL HIGH (ref 70–99)
Glucose-Capillary: 380 mg/dL — ABNORMAL HIGH (ref 70–99)

## 2019-05-29 LAB — BETA-HYDROXYBUTYRIC ACID
Beta-Hydroxybutyric Acid: 0.11 mmol/L (ref 0.05–0.27)
Beta-Hydroxybutyric Acid: 0.46 mmol/L — ABNORMAL HIGH (ref 0.05–0.27)

## 2019-05-29 MED ORDER — INSULIN ASPART 100 UNIT/ML ~~LOC~~ SOLN
0.0000 [IU] | Freq: Three times a day (TID) | SUBCUTANEOUS | Status: DC
  Administered 2019-05-29: 1 [IU] via SUBCUTANEOUS
  Administered 2019-05-29: 9 [IU] via SUBCUTANEOUS
  Filled 2019-05-29 (×2): qty 1

## 2019-05-29 MED ORDER — INSULIN ASPART 100 UNIT/ML ~~LOC~~ SOLN: 0.0000 [IU] | Freq: Every day | SUBCUTANEOUS | Status: DC

## 2019-05-29 MED ORDER — INSULIN ASPART 100 UNIT/ML ~~LOC~~ SOLN
3.0000 [IU] | Freq: Three times a day (TID) | SUBCUTANEOUS | Status: DC
  Administered 2019-05-29 (×2): 3 [IU] via SUBCUTANEOUS

## 2019-05-29 MED ORDER — INSULIN GLARGINE 100 UNIT/ML ~~LOC~~ SOLN
12.0000 [IU] | Freq: Every evening | SUBCUTANEOUS | Status: DC
  Administered 2019-05-29: 03:00:00 12 [IU] via SUBCUTANEOUS
  Filled 2019-05-29 (×2): qty 0.12

## 2019-05-29 MED ORDER — CHLORHEXIDINE GLUCONATE CLOTH 2 % EX PADS: 6.0000 | MEDICATED_PAD | Freq: Every day | CUTANEOUS | Status: DC

## 2019-05-29 MED ORDER — SODIUM CHLORIDE 0.9 % IV SOLN
INTRAVENOUS | Status: DC
Start: 2019-05-29 — End: 2019-05-29

## 2019-05-29 NOTE — Progress Notes (Signed)
Inpatient Diabetes Program Recommendations  AACE/ADA: New Consensus Statement on Inpatient Glycemic Control   Target Ranges:  Prepandial:   less than 140 mg/dL      Peak postprandial:   less than 180 mg/dL (1-2 hours)      Critically ill patients:  140 - 180 mg/dL  Results for ZARAE, NEER (MRN [id number]) as of 05/29/2019 09:50  Ref. Range 05/29/2019 00:44 05/29/2019 02:08 05/29/2019 04:26 05/29/2019 07:23  Glucose-Capillary Latest Ref Range: 70 - 99 mg/dL 153 (H) 146 (H) 127 (H) 135 (H)   Results for LOYCE, ATAMIAN (MRN [id number]) as of 05/29/2019 09:50  Ref. Range 05/28/2019 16:50 05/28/2019 17:32 05/28/2019 18:48 05/28/2019 19:52 05/28/2019 21:11 05/28/2019 22:21 05/28/2019 23:34  Glucose-Capillary Latest Ref Range: 70 - 99 mg/dL 463 (H) 379 (H) 202 (H) 175 (H) 176 (H) 184 (H) 142 (H)  Results for JAZZMA, SCHRUM (MRN [id number]) as of 05/29/2019 09:50  Ref. Range 10/17/2018 13:17 05/28/2019 13:35  Hemoglobin A1C Latest Ref Range: 4.8 - 5.6 % 11.7 (H) 12.5 (H)   Review of Glycemic Control  Outpatient Diabetes medications: 70/30 12 units BID Current orders for Inpatient glycemic control: Lantus 12 units QPM, Novolog 3 units TID with meals, Novolog 0-9 units TID with meals, Novolog 0-5 units QHS  Inpatient Diabetes Program Recommendations:   HbgA1C: A1C 12.5% on 05/28/19 indicating an average glucose of 312 mg/dl over the past 2-3 months.  NOTE: In reviewing the chart, noted patient goes to [facility] and had a televisit on 04/06/19. Per note on 04/06/19, patient is prescribed 70/30 12 units BID and Metformin 1000 mg daily. Per H&P, patient received a COVID vaccine on 05/25/2019 and started feeling sick the following day. "She said she did not take her insulin on the day of vaccination because she did not wanted to interact with the vaccine.  She also did not take any insulin in the last 2 days but she took some insulin today."  Patient admitted with DKA and was initially started on IV insulin which has been  transitioned to SQ insulin (received Lantus 12 units at 2:33 am on 05/29/19 and has an order to be discharged today.   Spoke with patient over the phone about diabetes and home regimen for diabetes control. Patient reports she is currently taking 70/30 12 units BID as an outpatient for diabetes control. Patient states that she is not taking Metformin any longer.  Patient reports checking glucose at home and states that "it is always high" but no specific numbers noted.   Discussed A1C results (12.5% on 05/28/19 ) and explained that current A1C indicates an average glucose of 312 mg/dl over the past 2-3 months. Patient states that sounds about right with the numbers she sees at home on glucometer.  Discussed glucose and A1C goals. Discussed importance of checking CBGs and maintaining good CBG control to prevent long-term and short-term complications. Explained how hyperglycemia leads to damage within blood vessels which lead to the common complications seen with uncontrolled diabetes. Stressed to the patient the importance of improving glycemic control to prevent further complications from uncontrolled diabetes. Discussed impact of nutrition, exercise, stress, sickness, and medications on diabetes control.  Discussed sick day rules and stressed importance of taking insulin even during sickness and that she may want to talk with PCP to see if she needs to take the same dose of insulin if she is not eating or if she needs to take a different dose.  Encouraged patient to check glucose 3-4  times a day, to take glucometer to follow up appointments, take insulin as prescribed consistently, and follow up with [facility] regarding DM management.  Patient states that she has everything she needs at home for DM management.  Patient verbalized understanding of information discussed and reports no further questions at this time related to diabetes.  Thanks, Barnie Alderman, RN, MSN, CDE Diabetes Coordinator Inpatient  Diabetes Program [phone] (Team Pager)

## 2019-05-29 NOTE — Care Management CC44 (Signed)
Condition Code 44 Documentation Completed  Patient Details  Name: MORGANE MAFFETT MRN: [id number] Date of Birth: Jul 29, 1960   Condition Code 44 given:  Yes Patient signature on Condition Code 44 notice:  Yes Documentation of 2 MD's agreement:    Code 44 added to claim:       Kamden Reber E Oralia Criger, LCSW 05/29/2019, 11:47 AM

## 2019-05-29 NOTE — Discharge Summary (Signed)
Physician Discharge Summary  Brittany [name] [id number] DOB: 08/13/1960 DOA: 05/28/2019  PCP: [facility]  Admit date: 05/28/2019 Discharge date: 05/29/2019  Discharge disposition: Home   Recommendations for Outpatient Follow-Up:   Outpatient follow-up with PCP in 1 week   Discharge Diagnosis:   Principal Problem:   DKA (diabetic ketoacidoses) ([place]) Active Problems:   Hypertensive urgency    Discharge Condition: Stable.  Diet recommendation: Low-salt and diabetic diet  Code status: Full code.    Hospital Course:   Ms.Brittany [name] is an 59 y.o. female with medical history significant for diabetes mellitus, hypertension, lupus, who presented to the hospital with nausea and vomiting.  She said she had a Covid vaccine on 05/25/2019 and the following day she started feeling sick.  She said she had nausea, vomiting, headache and generalized body aches.  She had not been able to keep anything down until the day of admission when she was able to drink some ginger ale.  She said she did not take her insulin on the day of vaccination because she did not want it to interact with the vaccine.  She also did not take any insulin in the last 2 days prior to admission but she took some insulin on the day of admission.  She was found to have DKA with glucose level of 508 and elevated anion gap.  She also had hypertensive urgency on admission.  She was IV fluids and IV insulin infusion per DKA protocol and DKA has resolved.  She was also treated with benazepril and blood pressure has improved.  Patient said she was no longer taking Lantus as listed on her admission med rec.  She said she has been taking insulin 70/30 12 units twice daily for the past 2 months.  However, she said her glucose level has still been elevated at home.  She was seen in consultation by the diabetic educator for further education on diabetes control.  All her symptoms have resolved and her condition has  improved.  She is stable stable for discharge to home today.  Insulin 70/30 was increased to 15 units twice daily on discharge.    Discharge Exam:   Vitals:   05/29/19 0000 05/29/19 0800  BP:    Pulse:    Resp:    Temp: (!) 97.2 F (36.2 C) 98 F (36.7 C)  SpO2:     Vitals:   05/28/19 1900 05/28/19 2000 05/29/19 0000 05/29/19 0800  BP:      Pulse:      Resp:      Temp:  (!) 97.4 F (36.3 C) (!) 97.2 F (36.2 C) 98 F (36.7 C)  TempSrc:  Oral Oral Axillary  SpO2:      Weight: 47.7 kg     Height: 5' (1.524 m)        GEN: NAD SKIN: No rash EYES: EOMI ENT: MMM CV: RRR PULM: CTA B ABD: soft, ND, NT, +BS CNS: AAO x 3, non focal EXT: No edema or tenderness   The results of significant diagnostics from this hospitalization (including imaging, microbiology, ancillary and laboratory) are listed below for reference.     Procedures and Diagnostic Studies:   No results found.   Labs:   Basic Metabolic Panel: Recent Labs  Lab 05/28/19 1335 05/28/19 1335 05/28/19 1912 05/28/19 1912 05/28/19 2212 05/28/19 2212 05/29/19 0219 05/29/19 0615  NA 124*  --  133*  --  132*  --  139 138  K 4.5   < >  4.0   < > 3.7   < > 3.7 4.0  CL 90*  --  100  --  100  --  107 108  CO2 14*  --  19*  --  22  --  26 25  GLUCOSE 508*  --  204*  --  186*  --  161* 123*  BUN 28*  --  22*  --  20  --  17 17  CREATININE 0.97  --  0.73  --  0.60  --  0.50 0.65  CALCIUM 9.5  --  8.7*  --  8.7*  --  9.0 8.9   < > = values in this interval not displayed.   GFR Estimated Creatinine Clearance: 54.4 mL/min (by C-G formula based on SCr of 0.65 mg/dL). Liver Function Tests: Recent Labs  Lab 05/28/19 1335  AST 16  ALT 18  ALKPHOS 76  BILITOT 2.5*  PROT 8.4*  ALBUMIN 4.0   Recent Labs  Lab 05/28/19 1335  LIPASE 69*   No results for input(s): AMMONIA in the last 168 hours. Coagulation profile No results for input(s): INR, PROTIME in the last 168 hours.  CBC: Recent Labs  Lab  05/28/19 1335  WBC 7.2  HGB 16.0*  HCT 46.7*  MCV 93.6  PLT 435*   Cardiac Enzymes: Recent Labs  Lab 05/28/19 1335  CKTOTAL 48   BNP: Invalid input(s): POCBNP CBG: Recent Labs  Lab 05/28/19 2334 05/29/19 0044 05/29/19 0208 05/29/19 0426 05/29/19 0723  GLUCAP 142* 153* 146* 127* 135*   D-Dimer No results for input(s): DDIMER in the last 72 hours. Hgb A1c Recent Labs    05/28/19 1335  HGBA1C 12.5*   Lipid Profile No results for input(s): CHOL, HDL, LDLCALC, TRIG, CHOLHDL, LDLDIRECT in the last 72 hours. Thyroid function studies No results for input(s): TSH, T4TOTAL, T3FREE, THYROIDAB in the last 72 hours.  Invalid input(s): FREET3 Anemia work up No results for input(s): VITAMINB12, FOLATE, FERRITIN, TIBC, IRON, RETICCTPCT in the last 72 hours. Microbiology Recent Results (from the past 240 hour(s))  Respiratory Panel by RT PCR (Flu A&B, Covid) - Nasopharyngeal Swab     Status: None   Collection Time: 05/28/19  5:01 PM   Specimen: Nasopharyngeal Swab  Result Value Ref Range Status   SARS Coronavirus 2 by RT PCR NEGATIVE NEGATIVE Final    Comment: (NOTE) SARS-CoV-2 target nucleic acids are NOT DETECTED. The SARS-CoV-2 RNA is generally detectable in upper respiratoy specimens during the acute phase of infection. The lowest concentration of SARS-CoV-2 viral copies this assay can detect is 131 copies/mL. A negative result does not preclude SARS-Cov-2 infection and should not be used as the sole basis for treatment or other patient management decisions. A negative result may occur with  improper specimen collection/handling, submission of specimen other than nasopharyngeal swab, presence of viral mutation(s) within the areas targeted by this assay, and inadequate number of viral copies (<131 copies/mL). A negative result must be combined with clinical observations, patient history, and epidemiological information. The expected result is Negative. Fact Sheet for  Patients:  PinkCheek.be Fact Sheet for Healthcare Providers:  GravelBags.it This test is not yet ap proved or cleared by the [place] FDA and  has been authorized for detection and/or diagnosis of SARS-CoV-2 by FDA under an Emergency Use Authorization (EUA). This EUA will remain  in effect (meaning this test can be used) for the duration of the COVID-19 declaration under Section 564(b)(1) of the Act, 21 U.S.C. section 360bbb-3(b)(1),  unless the authorization is terminated or revoked sooner.    Influenza A by PCR NEGATIVE NEGATIVE Final   Influenza B by PCR NEGATIVE NEGATIVE Final    Comment: (NOTE) The Xpert Xpress SARS-CoV-2/FLU/RSV assay is intended as an aid in  the diagnosis of influenza from Nasopharyngeal swab specimens and  should not be used as a sole basis for treatment. Nasal washings and  aspirates are unacceptable for Xpert Xpress SARS-CoV-2/FLU/RSV  testing. Fact Sheet for Patients: PinkCheek.be Fact Sheet for Healthcare Providers: GravelBags.it This test is not yet approved or cleared by the [place] FDA and  has been authorized for detection and/or diagnosis of SARS-CoV-2 by  FDA under an Emergency Use Authorization (EUA). This EUA will remain  in effect (meaning this test can be used) for the duration of the  Covid-19 declaration under Section 564(b)(1) of the Act, 21  U.S.C. section 360bbb-3(b)(1), unless the authorization is  terminated or revoked. Performed at [facility], [address], [place], [place] [address]      Discharge Instructions:   Discharge Instructions    Diet - low sodium heart healthy   Complete by: As directed    Diet Carb Modified   Complete by: As directed    Increase activity slowly   Complete by: As directed      Allergies as of 05/29/2019      Reactions   Cephalosporins Itching    Hydromorphone Itching   Keflin [cephalothin] Itching      Medication List    STOP taking these medications   insulin glargine (1 Unit Dial) 300 UNIT/ML Solostar Pen Commonly known as: TOUJEO   ondansetron 4 MG disintegrating tablet Commonly known as: Zofran ODT     TAKE these medications   atorvastatin 80 MG tablet Commonly known as: LIPITOR Take 80 mg by mouth Nightly.   benazepril 10 MG tablet Commonly known as: LOTENSIN Take 10 mg by mouth daily.   buPROPion 300 MG 24 hr tablet Commonly known as: WELLBUTRIN XL Take 300 mg by mouth daily.   folic acid 1 MG tablet Commonly known as: FOLVITE Take 1 mg by mouth daily.   gabapentin 600 MG tablet Commonly known as: NEURONTIN Take 1,200 mg by mouth at bedtime.   hydroxychloroquine 200 MG tablet Commonly known as: PLAQUENIL Take 200 mg by mouth daily.   hydrOXYzine 10 MG tablet Commonly known as: ATARAX/VISTARIL Take 10 mg by mouth 2 (two) times daily.   meloxicam 15 MG tablet Commonly known as: MOBIC Take 15 mg by mouth daily.   metFORMIN 1000 MG tablet Commonly known as: GLUCOPHAGE Take 1,000 mg by mouth daily with breakfast.   mirtazapine 15 MG tablet Commonly known as: REMERON Take 15 mg by mouth at bedtime.   NovoLIN 70/30 (70-30) 100 UNIT/ML injection Generic drug: insulin NPH-regular Human Inject 15 Units into the skin 2 (two) times daily with a meal.   Pancrelipase (Lip-Prot-Amyl) 24000-76000 units Cpep Take 3 capsules by mouth 3 (three) times daily.   rOPINIRole 0.5 MG tablet Commonly known as: REQUIP Take 0.5 mg by mouth at bedtime.         Time coordinating discharge: 31 minutes  Signed:  Ledger Heindl  [facility] 05/29/2019, 9:15 AM

## 2019-05-29 NOTE — Progress Notes (Signed)
Pt given discharge instructions and pt verbalized understanding. Pt currently waiting on daughter to pick her up.

## 2019-06-02 LAB — BLOOD GAS, VENOUS
Acid-base deficit: 10.2 mmol/L — ABNORMAL HIGH (ref 0.0–2.0)
Bicarbonate: 17.6 mmol/L — ABNORMAL LOW (ref 20.0–28.0)
O2 Saturation: 39.3 %
Patient temperature: 37
pCO2, Ven: 45 mmHg (ref 44.0–60.0)
pH, Ven: 7.2 — ABNORMAL LOW (ref 7.250–7.430)

## 2019-06-16 ENCOUNTER — Ambulatory Visit: Payer: Medicare Other | Attending: Internal Medicine

## 2019-06-16 ENCOUNTER — Ambulatory Visit: Payer: Medicare Other

## 2019-06-16 DIAGNOSIS — Z23 Encounter for immunization: Secondary | ICD-10-CM

## 2019-06-16 NOTE — Progress Notes (Signed)
   Covid-19 Vaccination Clinic  Name:  Brittany [name]    MRN: [id number] DOB: [birth year]  06/16/2019  Brittany [name] was observed post Covid-19 immunization for 15 minutes without incident. She was provided with Vaccine Information Sheet and instruction to access the V-Safe system.   Brittany [name] was instructed to call 911 with any severe reactions post vaccine: Marland Kitchen Difficulty breathing  . Swelling of face and throat  . A fast heartbeat  . A bad rash all over body  . Dizziness and weakness   Immunizations Administered    Name Date Dose VIS Date Route   Pfizer COVID-19 Vaccine 06/16/2019  4:08 PM 0.3 mL 04/08/2018 Intramuscular   Manufacturer: [place]   Lot: [id number]   [place]: [id number]

## 2019-07-12 ENCOUNTER — Other Ambulatory Visit: Payer: Self-pay

## 2019-07-12 ENCOUNTER — Inpatient Hospital Stay
Admission: EM | Admit: 2019-07-12 | Discharge: 2019-07-20 | DRG: 064 | Disposition: A | Discharge status: Rehabilitation Facility | Payer: Medicare Other | Attending: Internal Medicine | Admitting: Internal Medicine

## 2019-07-12 DIAGNOSIS — E876 Hypokalemia: Secondary | ICD-10-CM

## 2019-07-12 DIAGNOSIS — R17 Unspecified jaundice: Secondary | ICD-10-CM | POA: Diagnosis present

## 2019-07-12 DIAGNOSIS — M5412 Radiculopathy, cervical region: Secondary | ICD-10-CM | POA: Diagnosis present

## 2019-07-12 DIAGNOSIS — Z881 Allergy status to other antibiotic agents status: Secondary | ICD-10-CM

## 2019-07-12 DIAGNOSIS — Z9081 Acquired absence of spleen: Secondary | ICD-10-CM

## 2019-07-12 DIAGNOSIS — E891 Postprocedural hypoinsulinemia: Secondary | ICD-10-CM | POA: Diagnosis present

## 2019-07-12 DIAGNOSIS — I63542 Cerebral infarction due to unspecified occlusion or stenosis of left cerebellar artery: Secondary | ICD-10-CM

## 2019-07-12 DIAGNOSIS — R29701 NIHSS score 1: Secondary | ICD-10-CM | POA: Diagnosis present

## 2019-07-12 DIAGNOSIS — L930 Discoid lupus erythematosus: Secondary | ICD-10-CM | POA: Diagnosis present

## 2019-07-12 DIAGNOSIS — H532 Diplopia: Secondary | ICD-10-CM | POA: Diagnosis present

## 2019-07-12 DIAGNOSIS — D378 Neoplasm of uncertain behavior of other specified digestive organs: Secondary | ICD-10-CM | POA: Diagnosis present

## 2019-07-12 DIAGNOSIS — I63442 Cerebral infarction due to embolism of left cerebellar artery: Secondary | ICD-10-CM | POA: Diagnosis not present

## 2019-07-12 DIAGNOSIS — E860 Dehydration: Secondary | ICD-10-CM | POA: Diagnosis present

## 2019-07-12 DIAGNOSIS — Z90411 Acquired partial absence of pancreas: Secondary | ICD-10-CM

## 2019-07-12 DIAGNOSIS — E139 Other specified diabetes mellitus without complications: Secondary | ICD-10-CM

## 2019-07-12 DIAGNOSIS — F329 Major depressive disorder, single episode, unspecified: Secondary | ICD-10-CM | POA: Diagnosis present

## 2019-07-12 DIAGNOSIS — G4700 Insomnia, unspecified: Secondary | ICD-10-CM | POA: Diagnosis present

## 2019-07-12 DIAGNOSIS — I639 Cerebral infarction, unspecified: Secondary | ICD-10-CM

## 2019-07-12 DIAGNOSIS — R531 Weakness: Secondary | ICD-10-CM | POA: Diagnosis present

## 2019-07-12 DIAGNOSIS — Z794 Long term (current) use of insulin: Secondary | ICD-10-CM

## 2019-07-12 DIAGNOSIS — E1310 Other specified diabetes mellitus with ketoacidosis without coma: Secondary | ICD-10-CM | POA: Diagnosis present

## 2019-07-12 DIAGNOSIS — E7889 Other lipoprotein metabolism disorders: Secondary | ICD-10-CM | POA: Diagnosis present

## 2019-07-12 DIAGNOSIS — D490 Neoplasm of unspecified behavior of digestive system: Secondary | ICD-10-CM | POA: Diagnosis present

## 2019-07-12 DIAGNOSIS — E1110 Type 2 diabetes mellitus with ketoacidosis without coma: Secondary | ICD-10-CM | POA: Diagnosis not present

## 2019-07-12 DIAGNOSIS — R1110 Vomiting, unspecified: Secondary | ICD-10-CM

## 2019-07-12 DIAGNOSIS — Z79899 Other long term (current) drug therapy: Secondary | ICD-10-CM

## 2019-07-12 DIAGNOSIS — I10 Essential (primary) hypertension: Secondary | ICD-10-CM | POA: Diagnosis present

## 2019-07-12 DIAGNOSIS — F17200 Nicotine dependence, unspecified, uncomplicated: Secondary | ICD-10-CM | POA: Diagnosis present

## 2019-07-12 DIAGNOSIS — Z885 Allergy status to narcotic agent status: Secondary | ICD-10-CM

## 2019-07-12 DIAGNOSIS — R739 Hyperglycemia, unspecified: Secondary | ICD-10-CM

## 2019-07-12 DIAGNOSIS — I614 Nontraumatic intracerebral hemorrhage in cerebellum: Secondary | ICD-10-CM | POA: Diagnosis present

## 2019-07-12 DIAGNOSIS — Z20822 Contact with and (suspected) exposure to covid-19: Secondary | ICD-10-CM | POA: Diagnosis present

## 2019-07-12 DIAGNOSIS — I998 Other disorder of circulatory system: Secondary | ICD-10-CM | POA: Diagnosis present

## 2019-07-12 DIAGNOSIS — Z791 Long term (current) use of non-steroidal anti-inflammatories (NSAID): Secondary | ICD-10-CM

## 2019-07-12 DIAGNOSIS — G936 Cerebral edema: Secondary | ICD-10-CM | POA: Diagnosis present

## 2019-07-12 HISTORY — DX: Hypokalemia: E87.6

## 2019-07-12 LAB — CBC
HCT: 49.6 % — ABNORMAL HIGH (ref 36.0–46.0)
Hemoglobin: 17.1 g/dL — ABNORMAL HIGH (ref 12.0–15.0)
MCH: 32.3 pg (ref 26.0–34.0)
MCHC: 34.5 g/dL (ref 30.0–36.0)
MCV: 93.8 fL (ref 80.0–100.0)
Platelets: 413 10*3/uL — ABNORMAL HIGH (ref 150–400)
RBC: 5.29 MIL/uL — ABNORMAL HIGH (ref 3.87–5.11)
RDW: 13 % (ref 11.5–15.5)
WBC: 5.4 10*3/uL (ref 4.0–10.5)
nRBC: 0 % (ref 0.0–0.2)

## 2019-07-12 LAB — COMPREHENSIVE METABOLIC PANEL
ALT: 16 U/L (ref 0–44)
AST: 20 U/L (ref 15–41)
Albumin: 3.8 g/dL (ref 3.5–5.0)
Alkaline Phosphatase: 76 U/L (ref 38–126)
Anion gap: 25 — ABNORMAL HIGH (ref 5–15)
BUN: 19 mg/dL (ref 6–20)
CO2: 16 mmol/L — ABNORMAL LOW (ref 22–32)
Calcium: 9.3 mg/dL (ref 8.9–10.3)
Chloride: 90 mmol/L — ABNORMAL LOW (ref 98–111)
Creatinine, Ser: 0.9 mg/dL (ref 0.44–1.00)
GFR calc Af Amer: >60 mL/min (ref >60)
GFR calc non Af Amer: >60 mL/min (ref >60)
Glucose, Bld: 406 mg/dL — ABNORMAL HIGH (ref 70–99)
Potassium: 3.8 mmol/L (ref 3.5–5.1)
Sodium: 131 mmol/L — ABNORMAL LOW (ref 135–145)
Total Bilirubin: 2.1 mg/dL — ABNORMAL HIGH (ref 0.3–1.2)
Total Protein: 8.2 g/dL — ABNORMAL HIGH (ref 6.5–8.1)

## 2019-07-12 LAB — GLUCOSE, CAPILLARY
Glucose-Capillary: 398 mg/dL — ABNORMAL HIGH (ref 70–99)
Glucose-Capillary: 372 mg/dL — ABNORMAL HIGH (ref 70–99)
Glucose-Capillary: 198 mg/dL — ABNORMAL HIGH (ref 70–99)

## 2019-07-12 LAB — BLOOD GAS, VENOUS
Acid-Base Excess: 0.2 mmol/L (ref 0.0–2.0)
Bicarbonate: 24.7 mmol/L (ref 20.0–28.0)
O2 Saturation: 68 %
Patient temperature: 37
pCO2, Ven: 39 mmHg — ABNORMAL LOW (ref 44.0–60.0)
pH, Ven: 7.41 (ref 7.250–7.430)
pO2, Ven: 35 mmHg (ref 32.0–45.0)

## 2019-07-12 LAB — BASIC METABOLIC PANEL
Anion gap: 9 (ref 5–15)
BUN: 18 mg/dL (ref 6–20)
CO2: 24 mmol/L (ref 22–32)
Calcium: 7.8 mg/dL — ABNORMAL LOW (ref 8.9–10.3)
Chloride: 100 mmol/L (ref 98–111)
Creatinine, Ser: 0.61 mg/dL (ref 0.44–1.00)
GFR calc Af Amer: >60 mL/min (ref >60)
GFR calc non Af Amer: >60 mL/min (ref >60)
Glucose, Bld: 237 mg/dL — ABNORMAL HIGH (ref 70–99)
Potassium: 2.9 mmol/L — ABNORMAL LOW (ref 3.5–5.1)
Sodium: 133 mmol/L — ABNORMAL LOW (ref 135–145)

## 2019-07-12 LAB — LIPASE, BLOOD: Lipase: 49 U/L (ref 11–51)

## 2019-07-12 LAB — BETA-HYDROXYBUTYRIC ACID: Beta-Hydroxybutyric Acid: 4.73 mmol/L — ABNORMAL HIGH (ref 0.05–0.27)

## 2019-07-12 MED ORDER — BENAZEPRIL HCL 10 MG PO TABS
10.0000 mg | ORAL_TABLET | Freq: Every day | ORAL | Status: DC
  Administered 2019-07-13 – 2019-07-14 (×2): 10 mg via ORAL
  Filled 2019-07-12 (×2): qty 1

## 2019-07-12 MED ORDER — POTASSIUM CHLORIDE CRYS ER 20 MEQ PO TBCR
40.0000 meq | EXTENDED_RELEASE_TABLET | Freq: Once | ORAL | Status: AC
  Administered 2019-07-12: 40 meq via ORAL
  Filled 2019-07-12: qty 2

## 2019-07-12 MED ORDER — ACETAMINOPHEN 325 MG PO TABS
650.0000 mg | ORAL_TABLET | Freq: Four times a day (QID) | ORAL | Status: DC | PRN
  Administered 2019-07-13 – 2019-07-19 (×4): 650 mg via ORAL
  Filled 2019-07-12 (×4): qty 2

## 2019-07-12 MED ORDER — KETOROLAC TROMETHAMINE 30 MG/ML IJ SOLN
30.0000 mg | Freq: Once | INTRAMUSCULAR | Status: AC
  Administered 2019-07-12: 30 mg via INTRAVENOUS
  Filled 2019-07-12: qty 1

## 2019-07-12 MED ORDER — ACETAMINOPHEN 650 MG RE SUPP: 650.0000 mg | Freq: Four times a day (QID) | RECTAL | Status: DC | PRN

## 2019-07-12 MED ORDER — ONDANSETRON HCL 4 MG/2ML IJ SOLN
4.0000 mg | Freq: Once | INTRAMUSCULAR | Status: AC
  Administered 2019-07-12: 4 mg via INTRAVENOUS
  Filled 2019-07-12: qty 2

## 2019-07-12 MED ORDER — MORPHINE SULFATE (PF) 4 MG/ML IV SOLN
4.0000 mg | Freq: Once | INTRAVENOUS | Status: AC
  Administered 2019-07-12: 4 mg via INTRAVENOUS
  Filled 2019-07-12: qty 1

## 2019-07-12 MED ORDER — HYDROCODONE-ACETAMINOPHEN 5-325 MG PO TABS
1.0000 | ORAL_TABLET | ORAL | Status: DC | PRN
  Administered 2019-07-14: 1 via ORAL
  Administered 2019-07-16: 2 via ORAL
  Filled 2019-07-12: qty 1
  Filled 2019-07-12 (×2): qty 2

## 2019-07-12 MED ORDER — INSULIN ASPART 100 UNIT/ML ~~LOC~~ SOLN
0.0000 [IU] | Freq: Three times a day (TID) | SUBCUTANEOUS | Status: DC
  Administered 2019-07-13: 17:00:00 9 [IU] via SUBCUTANEOUS
  Administered 2019-07-13: 5 [IU] via SUBCUTANEOUS
  Administered 2019-07-13: 3 [IU] via SUBCUTANEOUS
  Administered 2019-07-14: 7 [IU] via SUBCUTANEOUS
  Administered 2019-07-14: 5 [IU] via SUBCUTANEOUS
  Administered 2019-07-14: 18:00:00 7 [IU] via SUBCUTANEOUS
  Administered 2019-07-15: 18:00:00 9 [IU] via SUBCUTANEOUS
  Administered 2019-07-15 – 2019-07-16 (×3): 3 [IU] via SUBCUTANEOUS
  Administered 2019-07-16: 2 [IU] via SUBCUTANEOUS
  Administered 2019-07-16: 18:00:00 1 [IU] via SUBCUTANEOUS
  Administered 2019-07-17: 18:00:00 3 [IU] via SUBCUTANEOUS
  Administered 2019-07-17: 5 [IU] via SUBCUTANEOUS
  Administered 2019-07-17: 9 [IU] via SUBCUTANEOUS
  Administered 2019-07-18 (×2): 7 [IU] via SUBCUTANEOUS
  Administered 2019-07-18: 5 [IU] via SUBCUTANEOUS
  Administered 2019-07-19: 2 [IU] via SUBCUTANEOUS
  Administered 2019-07-19 (×2): 7 [IU] via SUBCUTANEOUS
  Filled 2019-07-12 (×22): qty 1

## 2019-07-12 MED ORDER — ONDANSETRON HCL 4 MG PO TABS: 4.0000 mg | ORAL_TABLET | Freq: Four times a day (QID) | ORAL | Status: DC | PRN

## 2019-07-12 MED ORDER — INSULIN ASPART 100 UNIT/ML ~~LOC~~ SOLN
0.0000 [IU] | Freq: Every day | SUBCUTANEOUS | Status: DC
  Administered 2019-07-13: 4 [IU] via SUBCUTANEOUS
  Administered 2019-07-14: 3 [IU] via SUBCUTANEOUS
  Administered 2019-07-16: 22:00:00 2 [IU] via SUBCUTANEOUS
  Administered 2019-07-17: 5 [IU] via SUBCUTANEOUS
  Administered 2019-07-19: 21:00:00 4 [IU] via SUBCUTANEOUS
  Filled 2019-07-12 (×5): qty 1

## 2019-07-12 MED ORDER — ENOXAPARIN SODIUM 40 MG/0.4ML ~~LOC~~ SOLN
40.0000 mg | SUBCUTANEOUS | Status: DC
  Administered 2019-07-13: 40 mg via SUBCUTANEOUS
  Filled 2019-07-12: qty 0.4

## 2019-07-12 MED ORDER — SODIUM CHLORIDE 0.9 % IV SOLN: Freq: Once | INTRAVENOUS | Status: AC

## 2019-07-12 MED ORDER — INSULIN ASPART 100 UNIT/ML ~~LOC~~ SOLN
5.0000 [IU] | Freq: Once | SUBCUTANEOUS | Status: AC
  Administered 2019-07-12: 5 [IU] via SUBCUTANEOUS
  Filled 2019-07-12: qty 1

## 2019-07-12 MED ORDER — ONDANSETRON HCL 4 MG/2ML IJ SOLN
4.0000 mg | Freq: Four times a day (QID) | INTRAMUSCULAR | Status: DC | PRN
  Administered 2019-07-14 – 2019-07-18 (×4): 4 mg via INTRAVENOUS
  Filled 2019-07-12 (×4): qty 2

## 2019-07-12 MED ORDER — POTASSIUM CHLORIDE IN NACL 40-0.9 MEQ/L-% IV SOLN
INTRAVENOUS | Status: DC
  Administered 2019-07-12 – 2019-07-13 (×2): 125 mL/h via INTRAVENOUS
  Filled 2019-07-12 (×4): qty 1000

## 2019-07-12 NOTE — ED Provider Notes (Signed)
ER Provider Note       Time seen: 5:39 PM    I have reviewed the vital signs and the nursing notes.  HISTORY   Chief Complaint Emesis and Headache    HPI Brittany [name] is a 59 y.o. female with a history of cancer, collagen vascular disease, diabetes, hypertension, lupus who presents today for audible complaints.  Patient states she felt bad since Friday with a headache, fell yesterday and is not sleeping well.  Left arm feels numb she states, also blood sugar has been high.  Past Medical History:  Diagnosis Date  . Cancer ([place])   . Collagen vascular disease ([place])   . Diabetes mellitus without complication ([place])   . Hypertension   . Lupus [facility])     Past Surgical History:  Procedure Laterality Date  . PANCREATECTOMY    . spleenectomy      Allergies Cephalosporins, Hydromorphone, and Keflin [cephalothin]   Review of Systems Constitutional: Negative for fever. Cardiovascular: Negative for chest pain. Respiratory: Negative for shortness of breath. Gastrointestinal: Negative for abdominal pain, vomiting and diarrhea. Musculoskeletal: Negative for back pain. Skin: Negative for rash. Neurological: Positive for headache, numbness  All systems negative/normal/unremarkable except as stated in the HPI  ____________________________________________   PHYSICAL EXAM:  VITAL SIGNS: Vitals:   07/12/19 1504  BP: (!) 149/102  Pulse: 74  Resp: 16  Temp: (!) 97.5 F (36.4 C)  SpO2: 98%    Constitutional: Alert and oriented.  Mild distress Eyes: Conjunctivae are normal. Normal extraocular movements. ENT      Head: Normocephalic and atraumatic.      Nose: No congestion/rhinnorhea.      Mouth/Throat: Mucous membranes are moist.      Neck: No stridor. Cardiovascular: Normal rate, regular rhythm. No murmurs, rubs, or gallops. Respiratory: Normal respiratory effort without tachypnea nor retractions. Breath sounds are clear and equal bilaterally. No  wheezes/rales/rhonchi. Gastrointestinal: Soft and nontender. Normal bowel sounds Musculoskeletal: Nontender with normal range of motion in extremities. No lower extremity tenderness nor edema. Neurologic:  Normal speech and language. No gross focal neurologic deficits are appreciated.  Skin:  Skin is warm, dry and intact. No rash noted. Psychiatric: Depressed mood and affect ____________________________________________   LABS (pertinent positives/negatives)  Labs Reviewed  COMPREHENSIVE METABOLIC PANEL - Abnormal; Notable for the following components:      Result Value   Sodium 131 (*)    Chloride 90 (*)    CO2 16 (*)    Glucose, Bld 406 (*)    Total Protein 8.2 (*)    Total Bilirubin 2.1 (*)    Anion gap 25 (*)    All other components within normal limits  CBC - Abnormal; Notable for the following components:   RBC 5.29 (*)    Hemoglobin 17.1 (*)    HCT 49.6 (*)    Platelets 413 (*)    All other components within normal limits  GLUCOSE, CAPILLARY - Abnormal; Notable for the following components:   Glucose-Capillary 398 (*)    All other components within normal limits  BLOOD GAS, VENOUS - Abnormal; Notable for the following components:   pCO2, Ven 39 (*)    All other components within normal limits  GLUCOSE, CAPILLARY - Abnormal; Notable for the following components:   Glucose-Capillary 372 (*)    All other components within normal limits  BASIC METABOLIC PANEL - Abnormal; Notable for the following components:   Sodium 133 (*)    Potassium 2.9 (*)  Glucose, Bld 237 (*)    Calcium 7.8 (*)    All other components within normal limits  LIPASE, BLOOD  URINALYSIS, COMPLETE (UACMP) WITH MICROSCOPIC  BETA-HYDROXYBUTYRIC ACID   DIFFERENTIAL DIAGNOSIS  DKA, dehydration, electrolyte abnormality, HH NK, migraine, tension headache  ASSESSMENT AND PLAN  Headache, hyperglycemia, dehydration   Plan: The patient had presented for multiple complaints, mainly weakness, headache  and vomiting.  Blood sugars have been elevated and she had an elevated anion gap.  After fluids and antiemetics her labs look much improved.  She will be discharged with antiemetics and is cleared for outpatient follow-up.  Lenise Arena MD    Note: This note was generated in part or whole with voice recognition software. Voice recognition is usually quite accurate but there are transcription errors that can and very often do occur. I apologize for any typographical errors that were not detected and corrected.     Earleen Newport, MD 07/12/19 2150

## 2019-07-12 NOTE — H&P (Signed)
History and Physical    Brittany [name] [id number] DOB: 31-Oct-1960 DOA: 07/12/2019  PCP: [facility]   Patient coming from: Home  I have personally briefly reviewed patient's old medical records in [place]  Chief Complaint: Vomiting weakness and malaise  HPI: Brittany [name] is a 58 y.o. 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  She denied abdominal pain, fever chills, change in bowel habits or dysuria.  Denies chest pain, cough or shortness of breath ED Course: On initial work-up she was found to be in DKA with a blood sugar of 406 and anion gap of 25.  She was treated in the emergency room with IV fluid bolus and insulin and by repeat check 5 hours after arrival, her anion gap had closed to 9 with blood sugar down to 237 however potassium was low at 2.9.  She continued to complain of weakness and headache in the emergency room and hospitalization requested.  Her other work-up was notable for normal lipase of 49, elevated bilirubin at 2.1, elevated hemoglobin at 17.1 but was otherwise unremarkable.  Review of Systems: As per HPI otherwise 10 point review of systems negative.    Past Medical History:  Diagnosis Date  . Cancer ([place])   . Collagen vascular disease ([place])   . Diabetes mellitus without complication ([place])   . Hypertension   . Lupus [facility])     Past Surgical History:  Procedure Laterality Date  . PANCREATECTOMY    . spleenectomy       reports that she has been smoking. She has never used smokeless tobacco. She reports current alcohol use.  She reports previous drug use.  Allergies  Allergen Reactions  . Cephalosporins Itching  . Hydromorphone Itching  . Keflin [Cephalothin] Itching    History reviewed. No pertinent family history.   Prior to Admission medications   Medication Sig Start Date End Date Taking? Authorizing Provider  atorvastatin (LIPITOR) 80 MG tablet Take 80 mg by mouth Nightly.  05/26/18 05/28/19  [provider]  benazepril (LOTENSIN) 10 MG tablet Take 10 mg by mouth daily. 08/31/17   [provider]  buPROPion (WELLBUTRIN XL) 300 MG 24 hr tablet Take 300 mg by mouth daily. 12/19/17 05/28/19  [provider]  folic acid (FOLVITE) 1 MG tablet Take 1 mg by mouth daily. 09/13/17   [provider]  gabapentin (NEURONTIN) 600 MG tablet Take 1,200 mg by mouth at bedtime. 07/03/18   [provider]  hydroxychloroquine (PLAQUENIL) 200 MG tablet Take 200 mg by mouth daily.  05/26/18   [provider]  hydrOXYzine (ATARAX/VISTARIL) 10 MG tablet Take 10 mg by mouth 2 (two) times daily.  12/31/16   [provider]  insulin NPH-regular Human (NOVOLIN 70/30) (70-30) 100 UNIT/ML injection Inject 15 Units into the skin 2 (two) times daily with a meal. 05/29/19   Jennye Boroughs, MD  meloxicam (MOBIC) 15 MG tablet Take 15 mg by mouth daily. 09/03/17   [provider]  metFORMIN (GLUCOPHAGE) 1000 MG tablet Take 1,000 mg by mouth daily with breakfast.  08/08/17 05/28/19  [provider]  mirtazapine (REMERON) 15 MG tablet Take  15 mg by mouth at bedtime. 07/03/18 07/03/19  [provider]  Pancrelipase, Lip-Prot-Amyl, 24000-76000 units CPEP Take 3 capsules by mouth 3 (three) times daily. 05/26/18   [provider]  rOPINIRole (REQUIP) 0.5 MG tablet Take 0.5 mg by mouth at bedtime. 05/26/18   [provider]    Physical Exam: Vitals:   07/12/19 2100 07/12/19 2130 07/12/19 2200 07/12/19 2230  BP: 128/81 119/74 (!) 141/91 136/88  Pulse:  70 66 78 81  Resp: 18 14 14 17   Temp:      TempSrc:      SpO2: 95% 90% 100% 99%  Weight:      Height:         Vitals:   07/12/19 2100 07/12/19 2130 07/12/19 2200 07/12/19 2230  BP: 128/81 119/74 (!) 141/91 136/88  Pulse: 70 66 78 81  Resp: 18 14 14 17   Temp:      TempSrc:      SpO2: 95% 90% 100% 99%  Weight:      Height:        Constitutional: Alert and awake, oriented x3, not in any acute distress but very tearful. Eyes: PERLA, EOMI, irises appear normal, anicteric sclera,  ENMT: external ears and nose appear normal, normal hearing             Lips appears normal, oropharynx mucosa, tongue, posterior pharynx appear normal  Neck: neck appears normal, no masses, normal ROM, no thyromegaly, no JVD  CVS: S1-S2 clear, no murmur rubs or gallops,  , no carotid bruits, pedal pulses palpable, No LE edema Respiratory:  clear to auscultation bilaterally, no wheezing, rales or rhonchi. Respiratory effort normal. No accessory muscle use.  Abdomen: soft nontender, nondistended, normal bowel sounds, no hepatosplenomegaly, no hernias Musculoskeletal: : no cyanosis, clubbing , no contractures or atrophy Neuro: Cranial nerves II-XII intact, sensation, reflexes normal, strength Psych: judgement and insight appear normal, depressed affect,     Labs on Admission: I have personally reviewed following labs and imaging studies  CBC: Recent Labs  Lab 07/12/19 1558  WBC 5.4  HGB 17.1*  HCT 49.6*  MCV 93.8  PLT 123XX123*   Basic Metabolic Panel: Recent Labs  Lab 07/12/19 1558 07/12/19 2024  NA 131* 133*  K 3.8 2.9*  CL 90* 100  CO2 16* 24  GLUCOSE 406* 237*  BUN 19 18  CREATININE 0.90 0.61  CALCIUM 9.3 7.8*   GFR: Estimated Creatinine Clearance: 54.3 mL/min (by C-G formula based on SCr of 0.61 mg/dL). Liver Function Tests: Recent Labs  Lab 07/12/19 1558  AST 20  ALT 16  ALKPHOS 76  BILITOT 2.1*  PROT 8.2*  ALBUMIN 3.8   Recent Labs  Lab 07/12/19 1558  LIPASE 49   No  results for input(s): AMMONIA in the last 168 hours. Coagulation Profile: No results for input(s): INR, PROTIME in the last 168 hours. Cardiac Enzymes: No results for input(s): CKTOTAL, CKMB, CKMBINDEX, TROPONINI in the last 168 hours. BNP (last 3 results) No results for input(s): PROBNP in the last 8760 hours. HbA1C: No results for input(s): HGBA1C in the last 72 hours. CBG: Recent Labs  Lab 07/12/19 1510 07/12/19 1815  GLUCAP 398* 372*   Lipid Profile: No results for input(s): CHOL, HDL, LDLCALC, TRIG, CHOLHDL, LDLDIRECT in the last 72 hours. Thyroid Function Tests: No results for input(s): TSH, T4TOTAL, FREET4, T3FREE, THYROIDAB in the last 72 hours. Anemia Panel: No results for input(s): VITAMINB12, FOLATE, FERRITIN, TIBC, IRON, RETICCTPCT in the last 72 hours.  Urine analysis:    Component Value Date/Time   COLORURINE YELLOW (A) 10/17/2018 1840   APPEARANCEUR CLEAR (A) 10/17/2018 1840   APPEARANCEUR Hazy 04/04/2011 1935   LABSPEC 1.031 (H) 10/17/2018 1840   LABSPEC >1.060 04/04/2011 1935   PHURINE 5.0 10/17/2018 1840   GLUCOSEU >=500 (A) 10/17/2018 1840   GLUCOSEU 50 mg/dL 04/04/2011 1935   HGBUR SMALL (A) 10/17/2018 1840   BILIRUBINUR NEGATIVE 10/17/2018 1840   BILIRUBINUR Negative 04/04/2011 1935   KETONESUR 80 (A) 10/17/2018 1840   PROTEINUR 30 (A) 10/17/2018 1840   NITRITE POSITIVE (A) 10/17/2018 1840   LEUKOCYTESUR NEGATIVE 10/17/2018 1840   LEUKOCYTESUR Negative 04/04/2011 1935    Radiological Exams on Admission: No results found.  EKG: Independently reviewed.   Assessment/Plan Principal Problem:   DKA (diabetic ketoacidosis) ([place]) -Resolved with treatment in the emergency room.  Initial blood sugar was 402 with anion gap of 25, improved to 9 with IV hydration and insulin in the ER -Start subcutaneous insulin given the improvement in gap and continue to monitor closely -Follow-up A1c.  Previous A1c from 45 days ago was elevated at 12.8   Intractable  vomiting -Improved.  Etiology uncertain, could be related to the DKA -Lipase was normal at 49.  Patient denied abdominal pain or change in bowel habits or dysuria -Continue to monitor. -Bilirubin elevated at 2.1.  Consider RUQ sono if symptoms persist  Headache -Uncertain etiology.  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

## 2019-07-12 NOTE — ED Notes (Signed)
ED Provider at bedside. 

## 2019-07-12 NOTE — ED Notes (Signed)
Pt's phone plugged in, given Ice chips and two additional blankets for her feet per her request.

## 2019-07-12 NOTE — ED Notes (Signed)
Attempted IV x2, pt hard stick- Ultrasound IV requested. Pt provided diet soda and pillow and tv remote. Lights dimmed for comfort.

## 2019-07-12 NOTE — ED Triage Notes (Signed)
Pt here for multiple complaints. States felt bad since Friday with HA, fell yesterday, not sleeping. Denies diarrhea. Denies fever. Nausea and vomiting. C/o L arm is feeling numb. Extremity warm to touch, able to move it. CBG at home is "high". Pt type 2 DM, takes insulin.

## 2019-07-12 NOTE — ED Notes (Signed)
Report from lexi, rn.  

## 2019-07-12 NOTE — ED Notes (Signed)
Pt c/o continued headache, states relief of nausea.

## 2019-07-13 ENCOUNTER — Encounter: Payer: Self-pay | Admitting: Internal Medicine

## 2019-07-13 ENCOUNTER — Observation Stay: Payer: Medicare Other

## 2019-07-13 ENCOUNTER — Inpatient Hospital Stay: Payer: Medicare Other

## 2019-07-13 DIAGNOSIS — I998 Other disorder of circulatory system: Secondary | ICD-10-CM | POA: Diagnosis present

## 2019-07-13 DIAGNOSIS — L930 Discoid lupus erythematosus: Secondary | ICD-10-CM | POA: Diagnosis present

## 2019-07-13 DIAGNOSIS — R531 Weakness: Secondary | ICD-10-CM

## 2019-07-13 DIAGNOSIS — I614 Nontraumatic intracerebral hemorrhage in cerebellum: Secondary | ICD-10-CM | POA: Diagnosis present

## 2019-07-13 DIAGNOSIS — F17200 Nicotine dependence, unspecified, uncomplicated: Secondary | ICD-10-CM | POA: Diagnosis present

## 2019-07-13 DIAGNOSIS — Z90411 Acquired partial absence of pancreas: Secondary | ICD-10-CM

## 2019-07-13 DIAGNOSIS — M5412 Radiculopathy, cervical region: Secondary | ICD-10-CM | POA: Diagnosis present

## 2019-07-13 DIAGNOSIS — Z885 Allergy status to narcotic agent status: Secondary | ICD-10-CM | POA: Diagnosis not present

## 2019-07-13 DIAGNOSIS — R29701 NIHSS score 1: Secondary | ICD-10-CM | POA: Diagnosis present

## 2019-07-13 DIAGNOSIS — H532 Diplopia: Secondary | ICD-10-CM | POA: Diagnosis present

## 2019-07-13 DIAGNOSIS — Z881 Allergy status to other antibiotic agents status: Secondary | ICD-10-CM | POA: Diagnosis not present

## 2019-07-13 DIAGNOSIS — E1110 Type 2 diabetes mellitus with ketoacidosis without coma: Secondary | ICD-10-CM | POA: Diagnosis not present

## 2019-07-13 DIAGNOSIS — G4700 Insomnia, unspecified: Secondary | ICD-10-CM | POA: Diagnosis present

## 2019-07-13 DIAGNOSIS — E860 Dehydration: Secondary | ICD-10-CM | POA: Diagnosis present

## 2019-07-13 DIAGNOSIS — E876 Hypokalemia: Secondary | ICD-10-CM | POA: Diagnosis present

## 2019-07-13 DIAGNOSIS — E891 Postprocedural hypoinsulinemia: Secondary | ICD-10-CM | POA: Diagnosis present

## 2019-07-13 DIAGNOSIS — Z20822 Contact with and (suspected) exposure to covid-19: Secondary | ICD-10-CM | POA: Diagnosis present

## 2019-07-13 DIAGNOSIS — Z9081 Acquired absence of spleen: Secondary | ICD-10-CM | POA: Diagnosis not present

## 2019-07-13 DIAGNOSIS — I63442 Cerebral infarction due to embolism of left cerebellar artery: Secondary | ICD-10-CM | POA: Diagnosis present

## 2019-07-13 DIAGNOSIS — I361 Nonrheumatic tricuspid (valve) insufficiency: Secondary | ICD-10-CM | POA: Diagnosis not present

## 2019-07-13 DIAGNOSIS — G936 Cerebral edema: Secondary | ICD-10-CM | POA: Diagnosis present

## 2019-07-13 DIAGNOSIS — R112 Nausea with vomiting, unspecified: Secondary | ICD-10-CM

## 2019-07-13 DIAGNOSIS — E1310 Other specified diabetes mellitus with ketoacidosis without coma: Secondary | ICD-10-CM | POA: Diagnosis present

## 2019-07-13 DIAGNOSIS — E7889 Other lipoprotein metabolism disorders: Secondary | ICD-10-CM | POA: Diagnosis present

## 2019-07-13 DIAGNOSIS — R17 Unspecified jaundice: Secondary | ICD-10-CM | POA: Diagnosis present

## 2019-07-13 DIAGNOSIS — I639 Cerebral infarction, unspecified: Secondary | ICD-10-CM | POA: Diagnosis not present

## 2019-07-13 DIAGNOSIS — F329 Major depressive disorder, single episode, unspecified: Secondary | ICD-10-CM | POA: Diagnosis present

## 2019-07-13 DIAGNOSIS — I10 Essential (primary) hypertension: Secondary | ICD-10-CM | POA: Diagnosis present

## 2019-07-13 LAB — HEMOGLOBIN A1C
Hgb A1c MFr Bld: 13.5 % — ABNORMAL HIGH (ref 4.8–5.6)
Mean Plasma Glucose: 340.75 mg/dL

## 2019-07-13 LAB — BASIC METABOLIC PANEL
Anion gap: 9 (ref 5–15)
BUN: 17 mg/dL (ref 6–20)
CO2: 23 mmol/L (ref 22–32)
Calcium: 8.3 mg/dL — ABNORMAL LOW (ref 8.9–10.3)
Chloride: 101 mmol/L (ref 98–111)
Creatinine, Ser: 0.67 mg/dL (ref 0.44–1.00)
GFR calc Af Amer: >60 mL/min (ref >60)
GFR calc non Af Amer: >60 mL/min (ref >60)
Glucose, Bld: 231 mg/dL — ABNORMAL HIGH (ref 70–99)
Potassium: 4.9 mmol/L (ref 3.5–5.1)
Sodium: 133 mmol/L — ABNORMAL LOW (ref 135–145)

## 2019-07-13 LAB — CBC
HCT: 40.5 % (ref 36.0–46.0)
Hemoglobin: 13.9 g/dL (ref 12.0–15.0)
MCH: 32.2 pg (ref 26.0–34.0)
MCHC: 34.3 g/dL (ref 30.0–36.0)
MCV: 93.8 fL (ref 80.0–100.0)
Platelets: 401 10*3/uL — ABNORMAL HIGH (ref 150–400)
RBC: 4.32 MIL/uL (ref 3.87–5.11)
RDW: 12.6 % (ref 11.5–15.5)
WBC: 5.4 10*3/uL (ref 4.0–10.5)
nRBC: 0 % (ref 0.0–0.2)

## 2019-07-13 LAB — GLUCOSE, CAPILLARY
Glucose-Capillary: 224 mg/dL — ABNORMAL HIGH (ref 70–99)
Glucose-Capillary: 298 mg/dL — ABNORMAL HIGH (ref 70–99)
Glucose-Capillary: 358 mg/dL — ABNORMAL HIGH (ref 70–99)
Glucose-Capillary: 315 mg/dL — ABNORMAL HIGH (ref 70–99)

## 2019-07-13 LAB — SARS CORONAVIRUS 2 BY RT PCR (HOSPITAL ORDER, PERFORMED IN ~~LOC~~ HOSPITAL LAB): SARS Coronavirus 2: NEGATIVE

## 2019-07-13 MED ORDER — ATORVASTATIN CALCIUM 20 MG PO TABS
40.0000 mg | ORAL_TABLET | Freq: Every day | ORAL | Status: DC
  Administered 2019-07-13 – 2019-07-15 (×3): 40 mg via ORAL
  Filled 2019-07-13 (×3): qty 2

## 2019-07-13 MED ORDER — ASPIRIN 81 MG PO CHEW
81.0000 mg | CHEWABLE_TABLET | Freq: Every day | ORAL | Status: DC
  Administered 2019-07-13 – 2019-07-20 (×8): 81 mg via ORAL
  Filled 2019-07-13 (×8): qty 1

## 2019-07-13 MED ORDER — GABAPENTIN 300 MG PO CAPS
1200.0000 mg | ORAL_CAPSULE | Freq: Every day | ORAL | Status: DC
  Administered 2019-07-13 – 2019-07-19 (×8): 1200 mg via ORAL
  Filled 2019-07-13 (×8): qty 4

## 2019-07-13 MED ORDER — GADOBUTROL 1 MMOL/ML IV SOLN
5.0000 mL | Freq: Once | INTRAVENOUS | Status: AC | PRN
  Administered 2019-07-13: 5 mL via INTRAVENOUS
  Filled 2019-07-13: qty 6

## 2019-07-13 NOTE — ED Notes (Signed)
Messaged admitting provider for covid test order for admission

## 2019-07-13 NOTE — ED Notes (Signed)
Report to rebekah, rn.  

## 2019-07-13 NOTE — ED Notes (Signed)
Pt returned from MRI °

## 2019-07-13 NOTE — ED Notes (Signed)
Pt given breakfast tray

## 2019-07-13 NOTE — ED Notes (Signed)
Pt given ice chips

## 2019-07-13 NOTE — ED Notes (Signed)
Pt taken to MRI  

## 2019-07-13 NOTE — Progress Notes (Signed)
   07/13/19 1515  Clinical Encounter Type  Visited With Patient  Visit Type Initial;Spiritual support;Social support  Referral From Chaplain  Consult/Referral To [facility] after page from nurses station. Pt was crying and in pain. Pt felt like no one in her family loves her. Pt said that she has a lot of stress on her. I told her she was going to have to lose some of that stress. I prayed for Pt and plan to follow-up with Pt.

## 2019-07-13 NOTE — Evaluation (Signed)
Clinical/Bedside Swallow Evaluation Patient Details  Name: Brittany [name] MRN: [id number] Date of Birth: 15-Dec-1960  Today's Date: 07/13/2019 Time: SLP Start Time (ACUTE ONLY): 1350 SLP Stop Time (ACUTE ONLY): 1420 SLP Time Calculation (min) (ACUTE ONLY): 30 min  Past Medical History:  Past Medical History:  Diagnosis Date  . Cancer ([place])   . Collagen vascular disease ([place])   . Diabetes mellitus without complication ([place])   . Hypertension   . Lupus [facility])    Past Surgical History:  Past Surgical History:  Procedure Laterality Date  . PANCREATECTOMY    . spleenectomy     HPI:  Brittany [name] is an 59 y.o. 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  Found to be in DKA.  Had head CT performed that showed evidence of acute infarct as well. 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   Assessment / Plan / Recommendation Clinical Impression  Pt presents with adequate oropharyngeal abilities when consuming regular diet textures and thin liquids via cup and straw. Pt demonstrates good mastication of puree and solids with no oral residue post swallow. Pt's swallow initiation appears swift and timely with no overt coughing or throat clearing. As such, recommend pt consume regular diet with thin liquids and medicine whole. No further ST intervention is indicated.  SLP  Visit Diagnosis: Dysphagia, unspecified (R13.10)    Aspiration Risk  No limitations    Diet Recommendation   Age appropriate regular with thin liquids via cup or straw  Medication Administration: Whole meds with liquid    Other  Recommendations Oral Care Recommendations: Oral care BID   Follow up Recommendations None      Frequency and Duration   N/A         Prognosis   N/A     Swallow Study   General Date of Onset: 07/13/19 HPI: Brittany [name] is an 59 y.o. 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  Found to be in DKA.  Had head CT performed that showed evidence of acute infarct as well. 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 Type of Study: Bedside Swallow Evaluation Previous Swallow Assessment: none in chart Diet Prior to this Study: NPO Temperature Spikes Noted: No Respiratory Status: Room air History of Recent Intubation: No Behavior/Cognition: Alert;Cooperative;Pleasant mood Oral Cavity Assessment: Within Functional Limits Oral Care Completed by SLP: No Oral Cavity - Dentition: Adequate natural dentition Vision: Functional for self-feeding Self-Feeding Abilities: Able to feed self Patient Positioning: Upright in bed Baseline Vocal  Quality: Normal Volitional Cough: Strong Volitional Swallow: Able to elicit    Oral/Motor/Sensory  Function Overall Oral Motor/Sensory Function: Within functional limits   Ice Chips Ice chips: Not tested   Thin Liquid Thin Liquid: Within functional limits Presentation: Cup;Self Fed;Straw    Nectar Thick Nectar Thick Liquid: Not tested   Honey Thick Honey Thick Liquid: Not tested   Puree Puree: Within functional limits Presentation: Self Fed;Spoon   Solid    Brittany [name], M.S., CCC-SLP, [facility] Speech-Language Pathologist Rehabilitation Services Office [phone]  Solid: Within functional limits Presentation: Self Fed      Brittany [name] 07/13/2019,2:37 PM

## 2019-07-13 NOTE — ED Notes (Signed)
Admitting provider placed covid test order

## 2019-07-13 NOTE — Consult Note (Signed)
Requesting Physician: Kurtis Bushman    Chief Complaint: intractable nausea, vomiting  I have been asked by Dr. Kurtis Bushman to see this patient in consultation for Acute infarct.  HPI: Brittany [name] is an 59 y.o. 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  Found to be in DKA.  Had head CT performed that showed evidence of acute infarct as well.  Consult called for further recommendations.  Initial NIHSS of 1.  Date last known well: 07/09/2019 Time last known well: Time: 02:00 tPA Given: No: Outside time window  Past Medical History:  Diagnosis Date  . Cancer ([place])   . Collagen vascular disease ([place])   . Diabetes mellitus without complication ([place])   . Hypertension   . Lupus [facility])     Past Surgical History:  Procedure Laterality Date  . PANCREATECTOMY    . spleenectomy      Family history: Mother and father alive.  Mother with cataracts.  Sister with anxiety  Social History:  reports that she has been smoking. She has never used smokeless tobacco. She reports current alcohol use. She reports previous drug use.  Allergies:  Allergies  Allergen Reactions  . Cephalosporins Itching  . Hydromorphone Itching  . Keflin [Cephalothin] Itching    Medications:  I have reviewed the patient's current medications. Prior to Admission:  Medications Prior to Admission  Medication Sig Dispense Refill Last Dose  . atorvastatin (LIPITOR) 80 MG tablet Take 80 mg by mouth Nightly.      . benazepril (LOTENSIN) 10 MG tablet Take 10 mg by mouth daily.  3   . buPROPion (WELLBUTRIN XL) 300 MG 24 hr  tablet Take 300 mg by mouth daily.     . folic acid (FOLVITE) 1 MG tablet Take 1 mg by mouth daily.  11   . gabapentin (NEURONTIN) 600 MG tablet Take 1,200 mg by mouth at bedtime.     . hydroxychloroquine (PLAQUENIL) 200 MG tablet Take 200 mg by mouth daily.      . hydrOXYzine (ATARAX/VISTARIL) 10 MG tablet Take 10 mg by mouth 2 (two) times daily.      . insulin NPH-regular Human (NOVOLIN 70/30) (70-30) 100 UNIT/ML injection Inject 15 Units into the skin 2 (two) times daily with a meal.     . meloxicam (MOBIC) 15 MG tablet Take 15 mg by mouth daily.  11   . metFORMIN (GLUCOPHAGE) 1000 MG tablet Take 1,000 mg by mouth daily with breakfast.      . mirtazapine (REMERON) 15 MG tablet Take 15 mg by mouth at bedtime.     . Pancrelipase, Lip-Prot-Amyl, 24000-76000 units CPEP Take 3 capsules by mouth 3 (three) times daily.     Marland Kitchen rOPINIRole (REQUIP) 0.5 MG tablet Take 0.5 mg by mouth at bedtime.      Scheduled: . benazepril  10 mg Oral Daily  . enoxaparin (LOVENOX) injection  40 mg Subcutaneous Q24H  . gabapentin  1,200 mg Oral QHS  . insulin aspart  0-5 Units Subcutaneous QHS  . insulin aspart  0-9 Units Subcutaneous TID WC    ROS: History obtained from the patient  General ROS: negative for - chills, fatigue, fever,  night sweats, weight gain or weight loss Psychological ROS: negative for - behavioral disorder, hallucinations, memory difficulties, mood swings or suicidal ideation Ophthalmic ROS: negative for - blurry vision, double vision, eye pain or loss of vision ENT ROS: negative for - epistaxis, nasal discharge, oral lesions, sore throat, tinnitus or vertigo Allergy and Immunology ROS: negative for - hives or itchy/watery eyes Hematological and Lymphatic ROS: negative for - bleeding problems, bruising or swollen lymph nodes Endocrine ROS: negative for - galactorrhea, hair pattern changes, polydipsia/polyuria or temperature intolerance Respiratory ROS: negative for - cough, hemoptysis,  shortness of breath or wheezing Cardiovascular ROS: negative for - chest pain, dyspnea on exertion, edema or irregular heartbeat Gastrointestinal ROS: as noted in HPI Genito-Urinary ROS: negative for - dysuria, hematuria, incontinence or urinary frequency/urgency Musculoskeletal ROS: negative for - joint swelling or muscular weakness Neurological ROS: as noted in HPI Dermatological ROS: negative for rash and skin lesion changes  Physical Examination: Blood pressure (!) 150/95, pulse 66, temperature (!) 97.5 F (36.4 C), temperature source Oral, resp. rate 17, height 5' (1.524 m), weight 47.3 kg, SpO2 100 %.  HEENT-  Normocephalic, no lesions, without obvious abnormality.  Normal external eye and conjunctiva.  Normal TM's bilaterally.  Normal auditory canals and external ears. Normal external nose, mucus membranes and septum.  Normal pharynx. Cardiovascular- S1, S2 normal, pulses palpable throughout   Lungs- chest clear, no wheezing, rales, normal symmetric air entry Abdomen- soft, non-tender; bowel sounds normal; no masses,  no organomegaly Extremities- no edema Lymph-no adenopathy palpable Musculoskeletal-no joint tenderness, deformity or swelling Skin-warm and dry, no hyperpigmentation, vitiligo, or suspicious lesions  Neurological Examination   Mental Status: Alert, oriented, thought content appropriate.  Speech fluent without evidence of aphasia.  Able to follow 3 step commands without difficulty. Cranial Nerves: II: Visual fields grossly normal, pupils equal, round, reactive to light and accommodation III,IV, VI: ptosis not present, extra-ocular motions intact bilaterally V,VII: mild decrease in right NLF, facial light touch sensation decreased on the right VIII: hearing normal bilaterally IX,X: gag reflex present XI: bilateral shoulder shrug XII: tongue deviation to the right Motor: Right : Upper extremity   5/5    Left:     Upper extremity   5/5  Lower extremity    5/5     Lower extremity   5/5 Tone and bulk:normal tone throughout; no atrophy noted Sensory: Pinprick and light touch decreased on the right upper and lower extremities Deep Tendon Reflexes: Symmetric throughout Plantars: Right: mute   Left: mute Cerebellar: Normal finger-to-nose and normal heel-to-shin testing bilaterally Gait: not tested due to safety concerns   Laboratory Studies:  Basic Metabolic Panel: Recent Labs  Lab 07/12/19 1558 07/12/19 2024 07/13/19 0517  NA 131* 133* 133*  K 3.8 2.9* 4.9  CL 90* 100 101  CO2 16* 24 23  GLUCOSE 406* 237* 231*  BUN 19 18 17   CREATININE 0.90 0.61 0.67  CALCIUM 9.3 7.8* 8.3*    Liver Function Tests: Recent Labs  Lab 07/12/19 1558  AST 20  ALT 16  ALKPHOS 76  BILITOT 2.1*  PROT 8.2*  ALBUMIN 3.8   Recent Labs  Lab 07/12/19 1558  LIPASE 49   No results for input(s): AMMONIA in the last 168 hours.  CBC: Recent Labs  Lab 07/12/19 1558 07/13/19 0517  WBC 5.4 5.4  HGB 17.1* 13.9  HCT 49.6* 40.5  MCV 93.8 93.8  PLT 413* 401*    Cardiac Enzymes: No results for input(s): CKTOTAL, CKMB, CKMBINDEX, TROPONINI  in the last 168 hours.  BNP: Invalid input(s): POCBNP  CBG: Recent Labs  Lab 07/12/19 1510 07/12/19 1815 07/12/19 2308 07/13/19 0854 07/13/19 1202  GLUCAP 398* 372* 198* 224* 298*    Microbiology: Results for orders placed or performed during the hospital encounter of 07/12/19  SARS Coronavirus 2 by RT PCR (hospital order, performed in [facility] hospital lab) Nasopharyngeal Nasopharyngeal Swab     Status: None   Collection Time: 07/13/19  7:20 AM   Specimen: Nasopharyngeal Swab  Result Value Ref Range Status   SARS Coronavirus 2 NEGATIVE NEGATIVE Final    Comment: (NOTE) SARS-CoV-2 target nucleic acids are NOT DETECTED. The SARS-CoV-2 RNA is generally detectable in upper and lower respiratory specimens during the acute phase of infection. The lowest concentration of SARS-CoV-2 viral copies this  assay can detect is 250 copies / mL. A negative result does not preclude SARS-CoV-2 infection and should not be used as the sole basis for treatment or other patient management decisions.  A negative result may occur with improper specimen collection / handling, submission of specimen other than nasopharyngeal swab, presence of viral mutation(s) within the areas targeted by this assay, and inadequate number of viral copies (<250 copies / mL). A negative result must be combined with clinical observations, patient history, and epidemiological information. Fact Sheet for Patients:   StrictlyIdeas.no Fact Sheet for Healthcare Providers: BankingDealers.co.za This test is not yet approved or cleared  by the [place] FDA and has been authorized for detection and/or diagnosis of SARS-CoV-2 by FDA under an Emergency Use Authorization (EUA).  This EUA will remain in effect (meaning this test can be used) for the duration of the COVID-19 declaration under Section 564(b)(1) of the Act, 21 U.S.C. section 360bbb-3(b)(1), unless the authorization is terminated or revoked sooner. Performed at [facility], [address] [place], [place] [address]     Coagulation Studies: No results for input(s): LABPROT, INR in the last 72 hours.  Urinalysis: No results for input(s): COLORURINE, LABSPEC, PHURINE, GLUCOSEU, HGBUR, BILIRUBINUR, KETONESUR, PROTEINUR, UROBILINOGEN, NITRITE, LEUKOCYTESUR in the last 168 hours.  Invalid input(s): APPERANCEUR  Lipid Panel: No results found for: CHOL, TRIG, HDL, CHOLHDL, VLDL, LDLCALC  HgbA1C:  Lab Results  Component Value Date   HGBA1C 13.5 (H) 07/13/2019    Urine Drug Screen:      Component Value Date/Time   LABOPIA NONE DETECTED 10/17/2018 1840   COCAINSCRNUR NONE DETECTED 10/17/2018 1840   LABBENZ NONE DETECTED 10/17/2018 1840   AMPHETMU NONE DETECTED 10/17/2018 1840   THCU POSITIVE (A)  10/17/2018 1840   LABBARB NONE DETECTED 10/17/2018 1840    Alcohol Level: No results for input(s): ETH in the last 168 hours.  Other results: EKG: sinus rhythm at 67 bpm.  Imaging: CT HEAD WO CONTRAST  Result Date: 07/13/2019 CLINICAL DATA:  Headache EXAM: CT HEAD WITHOUT CONTRAST TECHNIQUE: Contiguous axial images were obtained from the base of the skull through the vertex without intravenous contrast. COMPARISON:  None. FINDINGS: Brain: Subtle area of white matter hypoattenuation involving the right frontal lobe adjacent to the anterior horn of the lateral ventricle. No extra-axial collections or midline shift. There is a large area of hypoattenuation involving the left cerebellum with slight effacement of the fourth ventricle. Patchy white matter hypoattenuation seen throughout. Vascular: No hyperdense vessel or unexpected calcification. Skull: The skull is intact. No fracture or focal lesion identified. Sinuses/Orbits: The visualized paranasal sinuses and mastoid air cells are clear. The orbits and globes intact. Other: None  IMPRESSION: Areas of hypodensity involving the right frontal lobe and posterior left cerebellum with slight mass effect upon the posterior right ventricle. This could be due to subacute infarct versus edema from underlying lesion. For further evaluation would recommend MRI. Electronically Signed   By: Prudencio Pair M.D.   On: 07/13/2019 03:47   MR BRAIN W WO CONTRAST  Result Date: 07/13/2019 CLINICAL DATA:  Abnormal CT EXAM: MRI HEAD WITHOUT AND WITH CONTRAST TECHNIQUE: Multiplanar, multiecho pulse sequences of the brain and surrounding structures were obtained without and with intravenous contrast. CONTRAST:  7mL GADAVIST GADOBUTROL 1 MMOL/ML IV SOLN COMPARISON:  Correlation made with CT earlier same day FINDINGS: Brain: There is a moderate size area of restricted diffusion within the inferior left cerebellar hemisphere. There is no associated enhancement. Mass effect is mild  without ascending or descending herniation or hydrocephalus. 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 Additional patchy and confluent T2 hyperintensity in the supratentorial white matter is nonspecific but probably reflects mild to moderate chronic microvascular ischemic changes. Ventricles and sulci are within normal limits in size and configuration. There is no extra-axial fluid collection. There is no intracranial mass, mass effect, or edema. There is no hydrocephalus or extra-axial fluid collection. No abnormal enhancement. Vascular: Major vessel flow voids at the skull base are preserved. Skull and upper cervical spine: Normal marrow signal is preserved. Sinuses/Orbits: Paranasal sinuses are aerated. Orbits are unremarkable. Other: Sella is unremarkable.  Mastoid air cells are clear. IMPRESSION: Acute infarction of inferior left cerebellum. Minor associated petechial hemorrhage. Mass effect is present without herniation or hydrocephalus. Mild to moderate chronic microvascular ischemic changes. Small right frontal chronic infarct. Electronically Signed   By: Macy Mis M.D.   On: 07/13/2019 10:35    Assessment: 59 y.o. 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  Found to be in DKA.  Noted to have evidence of infarcts on head CT.  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  No evidence of herniation or hydrocephalus.  Mass effect is noted as well as some petechial  hemorrhage.  Etiology likely small vessel disease due to poorly controlled risk factors.  Patient on no antiplatelet therapy prior to admission.  BP elevated.  A1c 13.5.  Stroke Risk Factors - diabetes mellitus, hypertension and smoking  Plan: 1. Fasting lipid panel.  Target LDL<70 2. Blood sugar management with target A1c<7.0 3. PT consult, OT consult, Speech consult 4. Echocardiogram with bubble study 5. Carotid dopplers 6. Prophylactic therapy-ASA 81 mg 7. NPO until RN stroke swallow screen 8. Telemetry monitoring 9. Frequent neuro checks 10. Blood pressure management with target BP<140/80 11. Smoking cessation counseling   Alexis Goodell, MD Neurology [phone] 07/13/2019, 1:13 PM

## 2019-07-13 NOTE — Progress Notes (Signed)
PROGRESS NOTE    Brittany [name]  [id number] DOB: [birth year] DOA: 07/12/2019 PCP: Clinic, [facility]    Brief Narrative:  Brittany [name] is a 59 y.o. 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  She denied abdominal pain, fever chills, change in bowel habits or dysuria.  Denies chest pain, cough or shortness of breath ED Course: On initial work-up she was found to be in DKA with a blood sugar of 406 and anion gap of 25.  She was treated in the emergency room with IV fluid bolus and insulin and by repeat check 5 hours after arrival, her anion gap had closed to 9 with blood sugar down to 237 however potassium was low at 2.9.  She continued to complain of weakness and headache in the emergency room and hospitalization requested.  Her other work-up was notable for normal lipase of 49, elevated bilirubin at 2.1, elevated hemoglobin at 17.1 but was otherwise unremarkable.    Consultants:     Procedures:  CT,  Antimicrobials:       Subjective: Pt lying on her side, eyes closed, tells me she is sleepy. But answers my questions. AAxox3. No cp, no sob, no other complaints  Objective: Vitals:   07/13/19 0000 07/13/19 0030 07/13/19 0300 07/13/19 1125  BP: 119/83 125/82 127/75 (!) 150/95  Pulse: 87 81 66 66  Resp:      Temp:    (!) 97.5 F (36.4 C)  TempSrc:    Oral  SpO2: 97% 96% 99% 100%  Weight:    47.3 kg  Height:    5' (1.524 m)    Intake/Output Summary (Last 24 hours) at 07/13/2019 1227 Last data filed at 07/13/2019 0736 Gross per 24 hour  Intake 1899 ml  Output  --  Net 1899 ml   Filed Weights   07/12/19 1506 07/13/19 1125  Weight: 45.4 kg 47.3 kg    Examination:  General exam: Appears calm and comfortable , sleeping on his side. Answers questions, but doesn't turn around for me. Respiratory system: Clear to auscultation. Respiratory effort normal. Cardiovascular system: S1 & S2 heard, RRR. No JVD, murmurs, rubs, gallops or clicks.  Gastrointestinal system: Abdomen is nondistended, soft and nontender. No organomegaly or masses felt. Normal bowel sounds heard. Central nervous system: Alert and orientedx3. Unable to assess as pt states she is sleeping Extremities: No edema Skin: Warm dry Psychiatry:. Mood & affect appropriate in current setting.     Data Reviewed: I have personally reviewed following labs and imaging studies  CBC: Recent Labs  Lab 07/12/19 1558 07/13/19 0517  WBC 5.4 5.4  HGB 17.1* 13.9  HCT 49.6* 40.5  MCV 93.8 93.8  PLT 413* [id number]*   Basic Metabolic Panel: Recent Labs  Lab 07/12/19 1558 07/12/19 2024 07/13/19 0517  NA 131* 133* 133*  K 3.8 2.9* 4.9  CL 90* 100 101  CO2 16* 24 23  GLUCOSE 406* 237* 231*  BUN 19 18 17   CREATININE 0.90 0.61 0.67  CALCIUM 9.3 7.8* 8.3*   GFR: Estimated Creatinine Clearance: 54.4 mL/min (by C-G formula based on SCr of 0.67 mg/dL). Liver Function Tests: Recent Labs  Lab  07/12/19 1558  AST 20  ALT 16  ALKPHOS 76  BILITOT 2.1*  PROT 8.2*  ALBUMIN 3.8   Recent Labs  Lab 07/12/19 1558  LIPASE 49   No results for input(s): AMMONIA in the last 168 hours. Coagulation Profile: No results for input(s): INR, PROTIME in the last 168 hours. Cardiac Enzymes: No results for input(s): CKTOTAL, CKMB, CKMBINDEX, TROPONINI in the last 168 hours. BNP (last 3 results) No results for input(s): PROBNP in the last 8760 hours. HbA1C: Recent Labs    07/13/19 0517  HGBA1C 13.5*   CBG: Recent Labs  Lab 07/12/19 1510 07/12/19 1815 07/12/19 2308 07/13/19 0854 07/13/19 1202   GLUCAP 398* 372* 198* 224* 298*   Lipid Profile: No results for input(s): CHOL, HDL, LDLCALC, TRIG, CHOLHDL, LDLDIRECT in the last 72 hours. Thyroid Function Tests: No results for input(s): TSH, T4TOTAL, FREET4, T3FREE, THYROIDAB in the last 72 hours. Anemia Panel: No results for input(s): VITAMINB12, FOLATE, FERRITIN, TIBC, IRON, RETICCTPCT in the last 72 hours. Sepsis Labs: No results for input(s): PROCALCITON, LATICACIDVEN in the last 168 hours.  Recent Results (from the past 240 hour(s))  SARS Coronavirus 2 by RT PCR (hospital order, performed in [facility] hospital lab) Nasopharyngeal Nasopharyngeal Swab     Status: None   Collection Time: 07/13/19  7:20 AM   Specimen: Nasopharyngeal Swab  Result Value Ref Range Status   SARS Coronavirus 2 NEGATIVE NEGATIVE Final    Comment: (NOTE) SARS-CoV-2 target nucleic acids are NOT DETECTED. The SARS-CoV-2 RNA is generally detectable in upper and lower respiratory specimens during the acute phase of infection. The lowest concentration of SARS-CoV-2 viral copies this assay can detect is 250 copies / mL. A negative result does not preclude SARS-CoV-2 infection and should not be used as the sole basis for treatment or other patient management decisions.  A negative result may occur with improper specimen collection / handling, submission of specimen other than nasopharyngeal swab, presence of viral mutation(s) within the areas targeted by this assay, and inadequate number of viral copies (<250 copies / mL). A negative result must be combined with clinical observations, patient history, and epidemiological information. Fact Sheet for Patients:   StrictlyIdeas.no Fact Sheet for Healthcare Providers: BankingDealers.co.za This test is not yet approved or cleared  by the [place] FDA and has been authorized for detection and/or diagnosis of SARS-CoV-2 by FDA under an Emergency Use  Authorization (EUA).  This EUA will remain in effect (meaning this test can be used) for the duration of the COVID-19 declaration under Section 564(b)(1) of the Act, 21 U.S.C. section 360bbb-3(b)(1), unless the authorization is terminated or revoked sooner. Performed at [facility], [address] [place], [place] [address]          Radiology Studies: CT HEAD WO CONTRAST  Result Date: 07/13/2019 CLINICAL DATA:  Headache EXAM: CT HEAD WITHOUT CONTRAST TECHNIQUE: Contiguous axial images were obtained from the base of the skull through the vertex without intravenous contrast. COMPARISON:  None. FINDINGS: Brain: Subtle area of white matter hypoattenuation involving the right frontal lobe adjacent to the anterior horn of the lateral ventricle. No extra-axial collections or midline shift. There is a large area of hypoattenuation involving the left cerebellum with slight effacement of the fourth ventricle. Patchy white matter hypoattenuation seen throughout. Vascular: No hyperdense vessel or unexpected calcification. Skull: The skull is intact. No fracture or focal lesion identified. Sinuses/Orbits: The visualized paranasal sinuses and mastoid air cells are clear. The orbits and globes  intact. Other: None IMPRESSION: Areas of hypodensity involving the right frontal lobe and posterior left cerebellum with slight mass effect upon the posterior right ventricle. This could be due to subacute infarct versus edema from underlying lesion. For further evaluation would recommend MRI. Electronically Signed   By: Prudencio Pair M.D.   On: 07/13/2019 03:47   MR BRAIN W WO CONTRAST  Result Date: 07/13/2019 CLINICAL DATA:  Abnormal CT EXAM: MRI HEAD WITHOUT AND WITH CONTRAST TECHNIQUE: Multiplanar, multiecho pulse sequences of the brain and surrounding structures were obtained without and with intravenous contrast. CONTRAST:  61mL GADAVIST GADOBUTROL 1 MMOL/ML IV SOLN COMPARISON:  Correlation made with CT  earlier same day FINDINGS: Brain: There is a moderate size area of restricted diffusion within the inferior left cerebellar hemisphere. There is no associated enhancement. Mass effect is mild without ascending or descending herniation or hydrocephalus. 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 Additional patchy and confluent T2 hyperintensity in the supratentorial white matter is nonspecific but probably reflects mild to moderate chronic microvascular ischemic changes. Ventricles and sulci are within normal limits in size and configuration. There is no extra-axial fluid collection. There is no intracranial mass, mass effect, or edema. There is no hydrocephalus or extra-axial fluid collection. No abnormal enhancement. Vascular: Major vessel flow voids at the skull base are preserved. Skull and upper cervical spine: Normal marrow signal is preserved. Sinuses/Orbits: Paranasal sinuses are aerated. Orbits are unremarkable. Other: Sella is unremarkable.  Mastoid air cells are clear. IMPRESSION: Acute infarction of inferior left cerebellum. Minor associated petechial hemorrhage. Mass effect is present without herniation or hydrocephalus. Mild to moderate chronic microvascular ischemic changes. Small right frontal chronic infarct. Electronically Signed   By: Macy Mis M.D.   On: 07/13/2019 10:35        Scheduled Meds: . benazepril  10 mg Oral Daily  . enoxaparin (LOVENOX) injection  40 mg Subcutaneous Q24H  . gabapentin  1,200 mg Oral QHS  . insulin aspart  0-5 Units Subcutaneous QHS  . insulin aspart  0-9 Units Subcutaneous TID WC   Continuous Infusions: . 0.9 % NaCl with KCl 40 mEq / L 125 mL/hr (07/13/19 0848)    Assessment & Plan:   Principal Problem:   DKA (diabetic ketoacidosis) ([place]) Active Problems:   Discoid lupus erythematosus   Intraductal papillary mucinous neoplasm of pancreas   Post-pancreatectomy  diabetes ([place])   Intractable vomiting   History of partial pancreatectomy   Generalized weakness   Hypokalemia   DKA (diabetic ketoacidosis) ([place]) -Resolved with treatment in the emergency room.  Initial blood sugar was 402 with anion gap of 25, improved to 9 with IV hydration and insulin in the ER -Start subcutaneous insulin given the improvement in gap and continue to monitor closely -A1c 13.5   Intractable vomiting -Improved.  Etiology uncertain, could be related to the DKA vs stroke -Lipase was normal at 49.  Patient denied abdominal pain or change in bowel habits or dysuria -Continue to monitor. -Bilirubin elevated at 2.1.  Consider RUQ sono if symptoms persist  Acute Stroke-  Pt c/o HA/vomiting, weakness. ..>obtained CT findings with stroke.Marland KitchenMRI obtained with acute infarction of inferior left cerebellum, Minor associated petechial hemorrhage. Mass effect is present without herniation or hydrocephalus. Neurology consulted, input appreciated.  After reviewing the MRI they did not feel it was necessary for patient to be transferred to [facility] for higher level of care.  No evidence of herniation or hydrocephalus  on MRI.  Etiology likely small vessel disease due to poorly controlled risk factors Stroke risk factors-diabetes mellitus, hypertension and smoking Target LDL less than 70 Will obtain fasting lipid panel High-dose statin therapy PT OT, speech therapy  Echo with bubble study  We will obtain carotid Doppler  Prophylactic aspirin 81 mg daily  Telemetry  Neuro checks  Blood pressure management with target BP less than 140/80  Smoking cessation counseling    Generalized weakness -Suspect secondary to DKA and nausea and vomiting, and likely stroke.    Hypokalemia,  Supplemented. Now resolved. K 4.9 Monitor levels Dc ivf with potassium in it     Discoid lupus erythematosus -Continue Plaquenil pending med rec -No acute issues  History of partial  pancreatectomy secondary to Intraductal papillary mucinous neoplasm of pancreas -Continue Pancrease     DVT prophylaxis: Lovenox  Code Status: full code  Family Communication:  none  Disposition Plan: Back to previous home environment versus SNF Barrier: None found with acute infarct will need to finish work-up. Once stable can DC. Needs PT OT evaluation. Likely DC in couple of days        LOS: 0 days   Time spent: 45 minutes with more than 50% on [place], MD [facility] Pager [phone]  If 7PM-7AM, please contact night-coverage [email or web address] Password [facility] 07/13/2019, 12:27 PM

## 2019-07-13 NOTE — ED Notes (Signed)
Patient resting quietly with eyes closed, no acute distress noted.

## 2019-07-14 ENCOUNTER — Inpatient Hospital Stay (HOSPITAL_COMMUNITY)
Admit: 2019-07-14 | Discharge: 2019-07-14 | Disposition: A | Discharge status: Home / Self Care | Payer: Medicare Other | Attending: Internal Medicine | Admitting: Internal Medicine

## 2019-07-14 ENCOUNTER — Inpatient Hospital Stay: Payer: Medicare Other

## 2019-07-14 DIAGNOSIS — I361 Nonrheumatic tricuspid (valve) insufficiency: Secondary | ICD-10-CM

## 2019-07-14 LAB — BASIC METABOLIC PANEL
Anion gap: 10 (ref 5–15)
BUN: 6 mg/dL (ref 6–20)
CO2: 26 mmol/L (ref 22–32)
Calcium: 8.4 mg/dL — ABNORMAL LOW (ref 8.9–10.3)
Chloride: 96 mmol/L — ABNORMAL LOW (ref 98–111)
Creatinine, Ser: 0.47 mg/dL (ref 0.44–1.00)
GFR calc Af Amer: >60 mL/min (ref >60)
GFR calc non Af Amer: >60 mL/min (ref >60)
Glucose, Bld: 308 mg/dL — ABNORMAL HIGH (ref 70–99)
Potassium: 3.8 mmol/L (ref 3.5–5.1)
Sodium: 132 mmol/L — ABNORMAL LOW (ref 135–145)

## 2019-07-14 LAB — GLUCOSE, CAPILLARY
Glucose-Capillary: 314 mg/dL — ABNORMAL HIGH (ref 70–99)
Glucose-Capillary: 271 mg/dL — ABNORMAL HIGH (ref 70–99)
Glucose-Capillary: 332 mg/dL — ABNORMAL HIGH (ref 70–99)
Glucose-Capillary: 237 mg/dL — ABNORMAL HIGH (ref 70–99)
Glucose-Capillary: 254 mg/dL — ABNORMAL HIGH (ref 70–99)

## 2019-07-14 LAB — LIPID PANEL
Cholesterol: 200 mg/dL (ref 0–200)
HDL: 33 mg/dL — ABNORMAL LOW (ref >40)
LDL Cholesterol: 153 mg/dL — ABNORMAL HIGH (ref 0–99)
Total CHOL/HDL Ratio: 6.1 RATIO
Triglycerides: 72 mg/dL (ref <150)
VLDL: 14 mg/dL (ref 0–40)

## 2019-07-14 MED ORDER — MAGNESIUM SULFATE 2 GM/50ML IV SOLN
2.0000 g | Freq: Once | INTRAVENOUS | Status: AC
  Administered 2019-07-14: 2 g via INTRAVENOUS
  Filled 2019-07-14: qty 50

## 2019-07-14 MED ORDER — TRAMADOL HCL 50 MG PO TABS
50.0000 mg | ORAL_TABLET | Freq: Once | ORAL | Status: AC
  Administered 2019-07-14: 50 mg via ORAL
  Filled 2019-07-14: qty 1

## 2019-07-14 MED ORDER — BENAZEPRIL HCL 10 MG PO TABS
10.0000 mg | ORAL_TABLET | Freq: Once | ORAL | Status: AC
  Administered 2019-07-14: 15:00:00 10 mg via ORAL
  Filled 2019-07-14: qty 1

## 2019-07-14 MED ORDER — KETOROLAC TROMETHAMINE 30 MG/ML IJ SOLN
15.0000 mg | Freq: Once | INTRAMUSCULAR | Status: AC
  Administered 2019-07-14: 23:00:00 15 mg via INTRAVENOUS
  Filled 2019-07-14: qty 1

## 2019-07-14 MED ORDER — LABETALOL HCL 5 MG/ML IV SOLN
10.0000 mg | INTRAVENOUS | Status: DC | PRN
  Administered 2019-07-14 – 2019-07-20 (×4): 10 mg via INTRAVENOUS
  Filled 2019-07-14 (×4): qty 4

## 2019-07-14 MED ORDER — GLUCERNA SHAKE PO LIQD
237.0000 mL | Freq: Three times a day (TID) | ORAL | Status: DC
  Administered 2019-07-14 – 2019-07-20 (×12): 237 mL via ORAL

## 2019-07-14 MED ORDER — BENAZEPRIL HCL 20 MG PO TABS
20.0000 mg | ORAL_TABLET | Freq: Every day | ORAL | Status: DC
  Administered 2019-07-15 – 2019-07-20 (×6): 20 mg via ORAL
  Filled 2019-07-14 (×6): qty 1

## 2019-07-14 MED ORDER — INSULIN ASPART PROT & ASPART (70-30 MIX) 100 UNIT/ML ~~LOC~~ SUSP
18.0000 [IU] | Freq: Two times a day (BID) | SUBCUTANEOUS | Status: DC
  Administered 2019-07-14 – 2019-07-15 (×2): 18 [IU] via SUBCUTANEOUS
  Filled 2019-07-14 (×2): qty 10

## 2019-07-14 MED ORDER — INSULIN ASPART PROT & ASPART (70-30 MIX) 100 UNIT/ML ~~LOC~~ SUSP: 18.0000 [IU] | Freq: Two times a day (BID) | SUBCUTANEOUS | Status: DC

## 2019-07-14 MED ORDER — MECLIZINE HCL 12.5 MG PO TABS
6.2500 mg | ORAL_TABLET | Freq: Three times a day (TID) | ORAL | Status: DC
  Administered 2019-07-14 – 2019-07-20 (×18): 6.25 mg via ORAL
  Filled 2019-07-14 (×21): qty 0.5

## 2019-07-14 MED ORDER — ADULT MULTIVITAMIN W/MINERALS CH
1.0000 | ORAL_TABLET | Freq: Every day | ORAL | Status: DC
  Administered 2019-07-14 – 2019-07-20 (×7): 1 via ORAL
  Filled 2019-07-14 (×7): qty 1

## 2019-07-14 NOTE — Discharge Instructions (Signed)

## 2019-07-14 NOTE — Progress Notes (Addendum)
PROGRESS NOTE    Brittany [name]  [id number] DOB: 03-07-1960 DOA: 07/12/2019 PCP: Clinic, [facility]    Brief Narrative:  Brittany [name] is a 59 y.o. 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  She denied abdominal pain, fever chills, change in bowel habits or dysuria.  Denies chest pain, cough or shortness of breath ED Course: On initial work-up she was found to be in DKA with a blood sugar of 406 and anion gap of 25.  She was treated in the emergency room with IV fluid bolus and insulin and by repeat check 5 hours after arrival, her anion gap had closed to 9 with blood sugar down to 237 however potassium was low at 2.9.  She continued to complain of weakness and headache in the emergency room and hospitalization requested.  Her other work-up was notable for normal lipase of 49, elevated bilirubin at 2.1, elevated hemoglobin at 17.1 but was otherwise unremarkable.    Consultants:   Neurology   Procedures:  CT MRI Acute infarction of inferior left cerebellum. Minor associated petechial hemorrhage. Mass effect is present without herniation or hydrocephalus. Mild to moderate chronic microvascular ischemic changes. Small right frontal chronic infarct.   Antimicrobials:       Subjective: Sitting up in the bed with the help of PT.  Had complaining of right-sided headache.  No shortness of breath, dizziness, presyncope or syncopal episodes. Telemetry sinus rhythm  Objective: Vitals:   07/14/19 0122 07/14/19 0548 07/14/19 0550 07/14/19 0800  BP:  (!) 199/107  (!) 195/114 (!) 160/97  Pulse: 84     Resp: 17 (!) 26 14 19   Temp: 98.2 F (36.8 C) 99.5 F (37.5 C)  98.6 F (37 C)  TempSrc: Oral Oral  Oral  SpO2: 100%     Weight:      Height:        Intake/Output Summary (Last 24 hours) at 07/14/2019 0814 Last data filed at 07/13/2019 1703 Gross per 24 hour  Intake 55.6 ml  Output --  Net 55.6 ml   Filed Weights   07/12/19 1506 07/13/19 1125  Weight: 45.4 kg 47.3 kg    Examination:  General exam: Appears calm and comfortable , sitting up in bed, NAD  respiratory system: Clear to auscultation. Respiratory effort normal.  Wheeze rales rhonchi's Cardiovascular system: S1 & S2 heard, RRR. No JVD, murmurs, rubs, gallops or clicks.  Gastrointestinal system: Abdomen is nondistended, soft and nontender. Normal bowel sounds heard. Central nervous system: Alert and orientedx3.  5 out of 5 global strength no facial droop, finger-to-nose normal  extremities: No edema Skin: Warm dry Psychiatry:. Mood & affect appropriate in current setting.     Data Reviewed: I have personally reviewed following labs and imaging studies  CBC: Recent Labs  Lab 07/12/19 1558 07/13/19 0517  WBC 5.4 5.4  HGB 17.1* 13.9  HCT 49.6* 40.5  MCV 93.8 93.8  PLT 413* 123XX123*   Basic Metabolic Panel: Recent Labs  Lab 07/12/19 1558 07/12/19 2024 07/13/19 0517 07/14/19 0632  NA 131* 133* 133* 132*  K 3.8 2.9* 4.9 3.8  CL 90* 100 101 96*  CO2 16*  24 23 26   GLUCOSE 406* 237* 231* 308*  BUN 19 18 17 6   CREATININE 0.90 0.61 0.67 0.47  CALCIUM 9.3 7.8* 8.3* 8.4*   GFR: Estimated Creatinine Clearance: 54.4 mL/min (by C-G formula based on SCr of 0.47 mg/dL). Liver Function Tests: Recent Labs  Lab 07/12/19 1558  AST 20  ALT 16  ALKPHOS 76  BILITOT 2.1*  PROT 8.2*  ALBUMIN 3.8   Recent Labs  Lab 07/12/19 1558  LIPASE 49   No results for input(s): AMMONIA in the last 168 hours. Coagulation Profile: No results for input(s): INR, PROTIME in the last 168  hours. Cardiac Enzymes: No results for input(s): CKTOTAL, CKMB, CKMBINDEX, TROPONINI in the last 168 hours. BNP (last 3 results) No results for input(s): PROBNP in the last 8760 hours. HbA1C: Recent Labs    07/13/19 0517  HGBA1C 13.5*   CBG: Recent Labs  Lab 07/13/19 0854 07/13/19 1202 07/13/19 1641 07/13/19 2105 07/14/19 0808  GLUCAP 224* 298* 358* 315* 314*   Lipid Profile: Recent Labs    07/14/19 0632  CHOL 200  HDL 33*  LDLCALC 153*  TRIG 72  CHOLHDL 6.1   Thyroid Function Tests: No results for input(s): TSH, T4TOTAL, FREET4, T3FREE, THYROIDAB in the last 72 hours. Anemia Panel: No results for input(s): VITAMINB12, FOLATE, FERRITIN, TIBC, IRON, RETICCTPCT in the last 72 hours. Sepsis Labs: No results for input(s): PROCALCITON, LATICACIDVEN in the last 168 hours.  Recent Results (from the past 240 hour(s))  SARS Coronavirus 2 by RT PCR (hospital order, performed in [facility] hospital lab) Nasopharyngeal Nasopharyngeal Swab     Status: None   Collection Time: 07/13/19  7:20 AM   Specimen: Nasopharyngeal Swab  Result Value Ref Range Status   SARS Coronavirus 2 NEGATIVE NEGATIVE Final    Comment: (NOTE) SARS-CoV-2 target nucleic acids are NOT DETECTED. The SARS-CoV-2 RNA is generally detectable in upper and lower respiratory specimens during the acute phase of infection. The lowest concentration of SARS-CoV-2 viral copies this assay can detect is 250 copies / mL. A negative result does not preclude SARS-CoV-2 infection and should not be used as the sole basis for treatment or other patient management decisions.  A negative result may occur with improper specimen collection / handling, submission of specimen other than nasopharyngeal swab, presence of viral mutation(s) within the areas targeted by this assay, and inadequate number of viral copies (<250 copies / mL). A negative result must be combined with clinical observations, patient history, and  epidemiological information. Fact Sheet for Patients:   StrictlyIdeas.no Fact Sheet for Healthcare Providers: BankingDealers.co.za This test is not yet approved or cleared  by the [place] FDA and has been authorized for detection and/or diagnosis of SARS-CoV-2 by FDA under an Emergency Use Authorization (EUA).  This EUA will remain in effect (meaning this test can be used) for the duration of the COVID-19 declaration under Section 564(b)(1) of the Act, 21 U.S.C. section 360bbb-3(b)(1), unless the authorization is terminated or revoked sooner. Performed at [facility], [address] [place], [place] [address]          Radiology Studies: CT HEAD WO CONTRAST  Result Date: 07/13/2019 CLINICAL DATA:  Headache EXAM: CT HEAD WITHOUT CONTRAST TECHNIQUE: Contiguous axial images were obtained from the base of the skull through the vertex without intravenous contrast. COMPARISON:  None. FINDINGS: Brain: Subtle area of white matter hypoattenuation involving the right frontal lobe adjacent to the anterior horn of the lateral ventricle. No extra-axial collections  or midline shift. There is a large area of hypoattenuation involving the left cerebellum with slight effacement of the fourth ventricle. Patchy white matter hypoattenuation seen throughout. Vascular: No hyperdense vessel or unexpected calcification. Skull: The skull is intact. No fracture or focal lesion identified. Sinuses/Orbits: The visualized paranasal sinuses and mastoid air cells are clear. The orbits and globes intact. Other: None IMPRESSION: Areas of hypodensity involving the right frontal lobe and posterior left cerebellum with slight mass effect upon the posterior right ventricle. This could be due to subacute infarct versus edema from underlying lesion. For further evaluation would recommend MRI. Electronically Signed   By: Prudencio Pair M.D.   On: 07/13/2019 03:47   MR  BRAIN W WO CONTRAST  Result Date: 07/13/2019 CLINICAL DATA:  Abnormal CT EXAM: MRI HEAD WITHOUT AND WITH CONTRAST TECHNIQUE: Multiplanar, multiecho pulse sequences of the brain and surrounding structures were obtained without and with intravenous contrast. CONTRAST:  57mL GADAVIST GADOBUTROL 1 MMOL/ML IV SOLN COMPARISON:  Correlation made with CT earlier same day FINDINGS: Brain: There is a moderate size area of restricted diffusion within the inferior left cerebellar hemisphere. There is no associated enhancement. Mass effect is mild without ascending or descending herniation or hydrocephalus. 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 Additional patchy and confluent T2 hyperintensity in the supratentorial white matter is nonspecific but probably reflects mild to moderate chronic microvascular ischemic changes. Ventricles and sulci are within normal limits in size and configuration. There is no extra-axial fluid collection. There is no intracranial mass, mass effect, or edema. There is no hydrocephalus or extra-axial fluid collection. No abnormal enhancement. Vascular: Major vessel flow voids at the skull base are preserved. Skull and upper cervical spine: Normal marrow signal is preserved. Sinuses/Orbits: Paranasal sinuses are aerated. Orbits are unremarkable. Other: Sella is unremarkable.  Mastoid air cells are clear. IMPRESSION: Acute infarction of inferior left cerebellum. Minor associated petechial hemorrhage. Mass effect is present without herniation or hydrocephalus. Mild to moderate chronic microvascular ischemic changes. Small right frontal chronic infarct. Electronically Signed   By: Macy Mis M.D.   On: 07/13/2019 10:35   US Carotid Bilateral  Result Date: 07/14/2019 CLINICAL DATA:  Recent stroke EXAM: BILATERAL CAROTID DUPLEX ULTRASOUND TECHNIQUE: Pearline Cables scale imaging, color Doppler and duplex ultrasound were performed of  bilateral carotid and vertebral arteries in the neck. COMPARISON:  None FINDINGS: Criteria: Quantification of carotid stenosis is based on velocity parameters that correlate the residual internal carotid diameter with NASCET-based stenosis levels, using the diameter of the distal internal carotid lumen as the denominator for stenosis measurement. The following velocity measurements were obtained: RIGHT ICA: 72/31 cm/sec CCA: 123XX123 cm/sec SYSTOLIC ICA/CCA RATIO:  1.5 ECA: 51 cm/sec LEFT ICA: 56/26 cm/sec CCA: [id number] cm/sec SYSTOLIC ICA/CCA RATIO:  1.2 ECA: 56 cm/sec RIGHT CAROTID ARTERY: Examination of the preliminary grayscale images demonstrate mild intimal thickening within the common carotid artery. Scattered calcified plaque is noted within the common and internal carotid arteries. The waveforms, velocities and flow velocity ratios however demonstrate no evidence of focal hemodynamically significant stenosis. RIGHT VERTEBRAL ARTERY:  Antegrade in nature. LEFT CAROTID ARTERY: Preliminary grayscale images demonstrate minimal atherosclerotic plaque in the region of the carotid bulb. The waveforms, velocities and flow velocity ratios however demonstrate no evidence of focal hemodynamically significant stenosis. LEFT VERTEBRAL ARTERY:  Antegrade in nature. IMPRESSION: Mild atherosclerotic plaque bilaterally without evidence of focal hemodynamically significant stenosis. Electronically Signed   By: Linus Mako.D.  On: 07/14/2019 02:15        Scheduled Meds: . aspirin  81 mg Oral Daily  . atorvastatin  40 mg Oral Daily  . benazepril  10 mg Oral Daily  . gabapentin  1,200 mg Oral QHS  . insulin aspart  0-5 Units Subcutaneous QHS  . 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  Initial blood sugar was 402 with anion gap of 25, improved to 9 with IV hydration and insulin in the ER -Started subcutaneous insulin given the improvement in gap and continue to monitor closely -A1c 13.5 Will add NovoLog 70/30 18 units twice daily per diabetic educator commendation   Intractable vomiting -Improved.  Etiology uncertain, could be related to the DKA and likely stroke -Lipase was normal at 49. -Continue to monitor. -Bilirubin elevated at 2.1.  Consider RUQ sono if symptoms persist  Acute Stroke-  Pt c/o HA/vomiting, weakness. ..>obtained CT findings with stroke.Marland KitchenMRI obtained with acute infarction of inferior left cerebellum, Minor associated petechial hemorrhage. Mass effect is present without herniation or hydrocephalus. Neurology consulted, input appreciated.  After reviewing the MRI they did not feel it was necessary for patient to be transferred to [facility] for higher level of care.  No evidence of herniation or hydrocephalus on MRI.  Etiology likely small vessel disease due to poorly controlled risk factors Stroke risk factors-diabetes mellitus, hypertension and smoking Fasting lipid panel needs improvement, target LDL less than 70 Started on statin therapy PT -recommends home PT and walker OT pending Echo with bubble study pending Mild atherosclerotic plaque without hemodynamically significant stenosis on carotid Doppler  Prophylactic aspirin 81 mg daily  Telemetry  Neuro checks  Blood pressure management with target BP less than 140/80  Smoking cessation counseling    Essential hypertension- Needs improvement.  Will increase the Benzapril to 20 mg daily   Generalized weakness -Suspect secondary to DKA and nausea and vomiting, and likely  stroke.    Hypokalemia,  Supplemented. Now resolved. K 4.9 Monitor levels      Discoid lupus erythematosus -Continue Plaquenil pending med rec -No acute issues  History of partial pancreatectomy secondary to Intraductal papillary mucinous neoplasm of pancreas -Continue Pancrease     DVT prophylaxis: Lovenox  Code Status: full code  Family Communication:  none  Disposition Plan: Back to previous home environment with [facility] Barrier:with acute stroke, not medically stable for safe dc planning. Needs to complete w/u . Possible d/c in am if all good       LOS: 1 day   Time spent: 45 minutes with more than 50% on [place], MD [facility] Pager [phone]  If 7PM-7AM, please contact night-coverage [email or web address] Password [email or web address] 07/14/2019, 8:14 AM

## 2019-07-14 NOTE — Evaluation (Signed)
Occupational Therapy Evaluation Patient Details Name: Brittany [name] MRN: [id number] DOB: 12/27/1960 Today's Date: 07/14/2019    History of Present Illness Pt admitted for DKA with + CVA in L cerebellum along with petchial hemorrhage. Initial complaints includes HA, fall, and L arm numbness (approx 2 months). PMH includes partail pancreatectomy, Lupus, DM, and HTN.    Clinical Impression   Pt was seen for OT evaluation this date. Prior to hospital admission, pt was generally independent with basic ADL tasks and mobility. Pt lives with family. Pt demonstrates impairments as described below (See OT problem list) which functionally limit her ability to perform ADL/self-care tasks. Pt currently requires Min A for ADL transfers and LB ADL tasks. Significantly limited this date by nausea with head movement, 10/10 headache, and BP in sitting EOB 158/102. Further ADL tasks deferred, pt returned to supine. RN and NT notified of pt complaints, BP, and need for bed pan shortly to attempt BM. Pt would benefit from skilled OT to address noted impairments and functional limitations (see below for any additional details) in order to maximize safety and independence while minimizing falls risk and caregiver burden. Upon hospital discharge and pending improvement in symptoms, recommend HHOT to maximize pt safety and return to functional independence during meaningful occupations of daily life.     Follow Up Recommendations  Home health OT;Supervision - Intermittent    Equipment Recommendations  3 in 1 bedside commode    Recommendations for Other Services       Precautions / Restrictions Precautions Precautions: Fall Restrictions Weight Bearing Restrictions: No      Mobility Bed Mobility Overal bed mobility: Needs Assistance Bed Mobility: Supine to Sit;Sit to Supine     Supine to sit: Min assist Sit to supine: Min guard   General bed mobility comments: slow processing of sequencing, cues to  perform  Transfers                 General transfer comment: deferred 2/2 nausea and elevated BP (158/102)    Balance Overall balance assessment: Needs assistance;History of Falls Sitting-balance support: Feet unsupported;Single extremity supported Sitting balance-Leahy Scale: Fair Sitting balance - Comments: requires at least 1 UE support on bed or bed rail                                   ADL either performed or assessed with clinical judgement   ADL Overall ADL's : Needs assistance/impaired                                       General ADL Comments: Functional ADL assessment limited 2/2 significant increase in nausea during ADL mobility prior to [facility] transfer for toileting task. Pt required max assist for donning socks at bed level, suspect primarily limited 2/2 nausea. Based on clinical reasoning anticipate pt will require Min A for LB ADL and CGA-Min A for [facility] transfer. Will continue to assess next session.     Vision Patient Visual Report: No change from baseline       Perception     Praxis      Pertinent Vitals/Pain Pain Assessment: 0-10 Pain Score: 10-Worst pain ever Pain Location: headache Pain Descriptors / Indicators: Constant;Discomfort Pain Intervention(s): Limited activity within patient's tolerance;Monitored during session;Premedicated before session;Repositioned     Hand Dominance     Extremity/Trunk  Assessment Upper Extremity Assessment Upper Extremity Assessment: Overall WFL for tasks assessed;Generalized weakness(complains of L fingers numb, grossly 4/5 bilat)   Lower Extremity Assessment Lower Extremity Assessment: Generalized weakness(B LE grossly 4/5)       Communication Communication Communication: No difficulties   Cognition Arousal/Alertness: Awake/alert Behavior During Therapy: WFL for tasks assessed/performed Overall Cognitive Status: No family/caregiver present to determine baseline cognitive  functioning                                 General Comments: slow processing/problem solving, and slow to initiate movements, requires cues   General Comments       Exercises     Shoulder Instructions      Home Living Family/patient expects to be discharged to:: Private residence Living Arrangements: Children(and granchild) Available Help at Discharge: Family Type of Home: House Home Access: Stairs to enter [employer] of Steps: 2-3 steps Entrance Stairs-Rails: Can reach both Home Layout: One level         Bathroom Toilet: Standard     Home Equipment: None          Prior Functioning/Environment Level of Independence: Independent        Comments: reports previous independence without AD. No recent falls except one that brought her to ED        OT Problem List: Decreased strength;Cardiopulmonary status limiting activity;Pain;Decreased activity tolerance;Decreased knowledge of use of DME or AE;Impaired balance (sitting and/or standing)      OT Treatment/Interventions: Self-care/ADL training;Therapeutic exercise;Therapeutic activities;Neuromuscular education;DME and/or AE instruction;Patient/family education;Balance training    OT Goals(Current goals can be found in the care plan section) Acute Rehab OT Goals Patient Stated Goal: feel better OT Goal Formulation: With patient Time For Goal Achievement: 07/28/19 Potential to Achieve Goals: Good ADL Goals Pt Will Perform Lower Body Bathing: with supervision;with set-up;sit to/from stand Pt Will Transfer to Toilet: with supervision;ambulating;bedside commode(LRAD for amb) Additional ADL Goal #1: Pt will verbalize plan to implement at least 1 learned falls prevention strategy to maximize safety.  OT Frequency: Min 2X/week   Barriers to D/C:            Co-evaluation              AM-PAC OT "6 Clicks" Daily Activity     Outcome Measure Help from another person eating meals?:  None Help from another person taking care of personal grooming?: None Help from another person toileting, which includes using toliet, bedpan, or urinal?: A Little Help from another person bathing (including washing, rinsing, drying)?: A Little Help from another person to put on and taking off regular upper body clothing?: A Little Help from another person to put on and taking off regular lower body clothing?: A Little 6 Click Score: 20   End of Session Nurse Communication: Other (comment);Patient requests pain meds(RN and NT: 10/10 headache, nausea, and BP 158/102)  Activity Tolerance: Treatment limited secondary to medical complications (Comment);Patient limited by pain(10/10 headache, nausea, and BP 158/102) Patient left:    OT Visit Diagnosis: Other abnormalities of gait and mobility (R26.89);Muscle weakness (generalized) (M62.81);Pain Pain - part of body: (headache)                Time: [id number] OT Time Calculation (min): 19 min Charges:  OT General Charges $OT Visit: 1 Visit OT Evaluation $OT Eval Moderate Complexity: 1 Mod  Jeni Salles, MPH, MS, OTR/L ascom [phone] 07/14/19, 3:03 PM

## 2019-07-14 NOTE — Progress Notes (Signed)
Inpatient Diabetes Program Recommendations  AACE/ADA: New Consensus Statement on Inpatient Glycemic Control (2015)  Target Ranges:  Prepandial:   less than 140 mg/dL      Peak postprandial:   less than 180 mg/dL (1-2 hours)      Critically ill patients:  140 - 180 mg/dL   Lab Results  Component Value Date   GLUCAP 314 (H) 07/14/2019   HGBA1C 13.5 (H) 07/13/2019    Review of Glycemic Control Results for CORINNE, CARRIGAN (MRN [id number]) as of 07/14/2019 10:13  Ref. Range 07/13/2019 08:54 07/13/2019 12:02 07/13/2019 16:41 07/13/2019 21:05 07/14/2019 08:08  Glucose-Capillary Latest Ref Range: 70 - 99 mg/dL 224 (H) 298 (H) 358 (H) 315 (H) 314 (H)   Diabetes history: DM Outpatient Diabetes medications: 70/30 insulin 18 units bid Current orders for Inpatient glycemic control: Novolog sensitive correction tid + hs 0-5 units  Inpatient Diabetes Program Recommendations:   -Add Novolog 70/30 insulin 18 units bid Secure chat to Dr. Kurtis Bushman  DM coordinator spoke with patient on 05/28/19 and A1c was 12.5 on this admission. Will plan to speak with patient.  Thank you, Nani Gasser. Yasmeen Manka, RN, MSN, CDE  Diabetes Coordinator Inpatient Glycemic Control Team Team Pager [phone] (8am-5pm) 07/14/2019 10:17 AM

## 2019-07-14 NOTE — Evaluation (Signed)
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 Initial complaints includes HA, fall, and L arm numbness (approx 2 months). PMH includes partail pancreatectomy, Lupus, DM, and HTN.   Clinical Impression  Pt is a pleasant 59 year old female who was admitted for DKA and CVA. Pt performs bed mobility with cga, transfers with min assist, and ambulation with cga and RW.  Pt demonstrates deficits with strength/mobility. Demonstrates slight ataxic movement during OOB mobility. No difficulty with coordination including toe taps and FTN testing. Is currently not at baseline level. Would benefit from skilled PT to address above deficits and promote optimal return to PLOF. Recommend transition to [place] upon discharge from acute hospitalization.     Follow Up Recommendations Home health PT;Supervision/Assistance - 24 hour    Equipment Recommendations  Rolling walker with 5" wheels    Recommendations for Other Services       Precautions / Restrictions Precautions Precautions: Fall Restrictions Weight Bearing Restrictions: No      Mobility  Bed Mobility Overal bed mobility: Needs Assistance Bed Mobility: Supine to Sit     Supine to sit: Min guard     General bed mobility comments: follows commands well. Very slow in processing. needs frequent cues. once seated at EOB, able to maintain upright posture  Transfers Overall transfer level: Needs assistance Equipment used: Rolling walker (2 wheeled) Transfers: Sit to/from Stand Sit to Stand: Min assist         General transfer comment: initial standing performed without RW. Very shaky and reports she feels she will fall. further attempts with RW with improved balance.  Ambulation/Gait Ambulation/Gait assistance: Min guard Gait Distance (Feet): 2  Feet Assistive device: Rolling walker (2 wheeled) Gait Pattern/deviations: Step-to pattern     General Gait Details: very cautious steps at bedside. Feels weak and reports constant HA and neck pain further limiting ambulation  Stairs            Wheelchair Mobility    Modified Rankin (Stroke Patients Only)       Balance Overall balance assessment: Needs assistance;History of Falls Sitting-balance support: Feet supported Sitting balance-Leahy Scale: Good     Standing balance support: Bilateral upper extremity supported Standing balance-Leahy Scale: Fair Standing balance comment: needs B hands on RW for support                             Pertinent Vitals/Pain Pain Assessment: 0-10 Pain Score: 10-Worst pain ever Pain Location: R side head/neck Pain Descriptors / Indicators: Constant;Discomfort Pain Intervention(s): Limited activity within patient's tolerance;Repositioned    Home Living Family/patient expects to be discharged to:: Private residence Living Arrangements: Children(and granchild) Available Help at Discharge: Family Type of Home: House Home Access: Stairs to enter Entrance Stairs-Rails: Can reach both Entrance Stairs-Number of Steps: 2-3 steps Home Layout: One level Home Equipment: None      Prior Function Level of Independence: Independent         Comments: reports previous independence without AD. No recent falls except one that brought her to ED     Hand Dominance        Extremity/Trunk Assessment   Upper Extremity Assessment Upper Extremity Assessment: Overall WFL for tasks assessed(complains of L fingers numb)    Lower Extremity Assessment Lower Extremity Assessment: Generalized  weakness(B LE grossly 4/5)       Communication   Communication: No difficulties  Cognition Arousal/Alertness: Awake/alert Behavior During Therapy: WFL for tasks assessed/performed Overall Cognitive Status: Within Functional Limits for  tasks assessed                                 General Comments: beomes emotional when discussing current stressors      General Comments      Exercises Other Exercises Other Exercises: supine ther-ex performed on B LE including SLRs, LAQ, and AP. All ther-ex performed x 10 reps with cues for sequencing and safe technique. Encouraged to continue ther-ex in room throughout the day.   Assessment/Plan    PT Assessment Patient needs continued PT services  PT Problem List Decreased strength;Decreased balance;Decreased mobility;Pain;Decreased safety awareness;Decreased knowledge of use of DME       PT Treatment Interventions DME instruction;Gait training;Therapeutic activities;Therapeutic exercise;Balance training    PT Goals (Current goals can be found in the Care Plan section)  Acute Rehab PT Goals Patient Stated Goal: to walk again PT Goal Formulation: With patient Time For Goal Achievement: 07/28/19 Potential to Achieve Goals: Good    Frequency 7X/week   Barriers to discharge        Co-evaluation               AM-PAC PT "6 Clicks" Mobility  Outcome Measure Help needed turning from your back to your side while in a flat bed without using bedrails?: A Little Help needed moving from lying on your back to sitting on the side of a flat bed without using bedrails?: A Little Help needed moving to and from a bed to a chair (including a wheelchair)?: A Little Help needed standing up from a chair using your arms (e.g., wheelchair or bedside chair)?: A Little Help needed to walk in hospital room?: A Little Help needed climbing 3-5 steps with a railing? : A Lot 6 Click Score: 17    End of Session Equipment Utilized During Treatment: Gait belt Activity Tolerance: Patient tolerated treatment well Patient left: in bed;with bed alarm set Nurse Communication: Mobility status PT Visit Diagnosis: Muscle weakness (generalized) (M62.81);Difficulty in walking, not  elsewhere classified (R26.2);Ataxic gait (R26.0);Unsteadiness on feet (R26.81);History of falling (Z91.81);Pain Pain - Right/Left: Right Pain - part of body: (head)    Time: [id number] PT Time Calculation (min) (ACUTE ONLY): 25 min   Charges:   PT Evaluation $PT Eval Low Complexity: 1 Low PT Treatments $Therapeutic Exercise: 8-22 mins        Greggory Stallion, PT, DPT [phone]   Everett Ehrler 07/14/2019, 11:54 AM

## 2019-07-14 NOTE — Progress Notes (Signed)
Initial Nutrition Assessment  DOCUMENTATION CODES:   Not applicable  INTERVENTION:  Education provided on Heart Healthy Consistent Carbohydrate Diet (Copy of education attached to discharge instructions) Glucerna Shake po TID, each supplement provides 220 kcal and 10 grams of protein (vanilla) MVI with minerals daily   NUTRITION DIAGNOSIS:   Inadequate oral intake related to acute illness(DKA) as evidenced by per patient/family report(nausea with vomiting 3 days prior to admission).  GOAL:   Patient will meet greater than or equal to 90% of their needs    MONITOR:   PO intake, Weight trends, Labs, I & O's, Supplement acceptance  REASON FOR ASSESSMENT:   Malnutrition Screening Tool    ASSESSMENT:  59 year old female admitted for DKA after presenting with 3 day history of nausea, vomiting, high blood sugars at home, generalized malaise, weakness, headache, and insomnia. 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  Patient awake, alert, breakfast tray sitting on counter, noted 75% of meal consumed. She reports having eggs, grits, and sausage, denies nausea this morning. She recalls unable to eat much of anything and drinking Gatorade over the past few days secondary to severe nausea and vomiting. Patient reports usually eating 1 meal/day at home, recalls sub sandwiches, fish, chicken, states that she really does not like pizza. Patient drinks diet and regular sodas as well as Gatorade. RD provided heart healthy consistent carbohydrate diet education, encouraged small frequent meals throughout the day to obtain better glucose control, lowering A1c. Recommended  increasing water intake, advised on drinking diet soda and sugar free Gatorade, avoiding intake of regular soda. Patient amenable to vanilla Glucerna Shake during admission.    Current wt 104.06 lb She endorses recent weight loss and generally feeling unwell since receiving her first Covid-19 vaccine a couple of months ago. She recalls usually weighing around 125 lbs. Per history, on 4/15 pt weighed 104.94 lb, on 1/31 she weighed 119.68 lb. This indicates a 15.62 lb (13.1%) wt loss in 4 months which is significant.  Medications reviewed and include: Gabapentin, SSI Labs:  CBGs 314,315,358,298,224 x 24 hrs, Na 132 (L) Lab Results  Component Value Date   HGBA1C 13.5 (H) 07/13/2019     NUTRITION - FOCUSED PHYSICAL EXAM: 6/1 Findings: Mild fat depletion to orbital, upper arm, and buccal regions, Mild muscle depletion to temple, clavicle bone, dorsal hand, and patellar regions.    Diet Order:   Diet Order            Diet Carb Modified Fluid consistency: Thin; Room service appropriate? Yes  Diet effective now              EDUCATION NEEDS:   Education needs have been addressed  Skin:  Skin Assessment: Reviewed RN Assessment  Last BM:  5/31  Height:   Ht Readings from Last 1 Encounters:  07/13/19 5' (1.524 m)    Weight:   Wt Readings from Last 1 Encounters:  07/13/19 47.3 kg    BMI:  Body mass index is 20.37 kg/m.  Estimated Nutritional Needs:   Kcal:  [id number]  Protein:  70-83  Fluid:  >/= 1.4 L/day   Lajuan Lines, RD, LDN Clinical Nutrition After Hours/Weekend Pager # in [place]
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
## 2019-07-14 NOTE — Progress Notes (Signed)
*  PRELIMINARY RESULTS* Echocardiogram 2D Echocardiogram has been performed.  Brittany [name] 07/14/2019, 10:11 AM

## 2019-07-14 NOTE — Progress Notes (Signed)
Subjective: Patient reports a headache today but no new focal complaints.    Objective: Current vital signs: BP (!) 160/97 (BP Location: Left Arm)   Pulse 84   Temp 98.6 F (37 C) (Oral)   Resp 19   Ht 5' (1.524 m)   Wt 47.3 kg   SpO2 100%   BMI 20.37 kg/m  Vital signs in last 24 hours: Temp:  [97.5 F (36.4 C)-99.5 F (37.5 C)] 98.6 F (37 C) (06/01 0800) Pulse Rate:  [63-84] 84 (06/01 0122) Resp:  [14-26] 19 (06/01 0800) BP: (140-199)/(95-114) 160/97 (06/01 0800) SpO2:  [98 %-100 %] 100 % (06/01 0122) Weight:  [47.3 kg] 47.3 kg (05/31 1125)  Intake/Output from previous day: 05/31 0701 - 06/01 0700 In: 1054.6 [I.V.:1054.6] Out: -  Intake/Output this shift: No intake/output data recorded. Nutritional status:  Diet Order            Diet Carb Modified Fluid consistency: Thin; Room service appropriate? Yes  Diet effective now              Neurologic Exam: Mental Status: Alert, oriented, thought content appropriate.  Speech fluent without evidence of aphasia.  Able to follow 3 step commands without difficulty. Cranial Nerves: II: Visual fields grossly normal, pupils equal, round, reactive to light and accommodation III,IV, VI: ptosis not present, extra-ocular motions intact bilaterally V,VII: mild decrease in right NLF, facial light touch sensation normal bilaterally VIII: hearing normal bilaterally IX,X: gag reflex present XI: bilateral shoulder shrug XII: midline tongue extension Motor: Right : Upper extremity   5/5    Left:     Upper extremity   5/5  Lower extremity   5/5     Lower extremity   5/5 Tone and bulk:normal tone throughout; no atrophy noted Sensory: Pinprick and light touch decreased in the fingertips of the LUE    Lab Results: Basic Metabolic Panel: Recent Labs  Lab 07/12/19 1558 07/12/19 1558 07/12/19 2024 07/13/19 0517 07/14/19 0632  NA 131*  --  133* 133* 132*  K 3.8  --  2.9* 4.9 3.8  CL 90*  --  100 101 96*  CO2 16*  --  24 23  26   GLUCOSE 406*  --  237* 231* 308*  BUN 19  --  18 17 6   CREATININE 0.90  --  0.61 0.67 0.47  CALCIUM 9.3   < > 7.8* 8.3* 8.4*   < > = values in this interval not displayed.    Liver Function Tests: Recent Labs  Lab 07/12/19 1558  AST 20  ALT 16  ALKPHOS 76  BILITOT 2.1*  PROT 8.2*  ALBUMIN 3.8   Recent Labs  Lab 07/12/19 1558  LIPASE 49   No results for input(s): AMMONIA in the last 168 hours.  CBC: Recent Labs  Lab 07/12/19 1558 07/13/19 0517  WBC 5.4 5.4  HGB 17.1* 13.9  HCT 49.6* 40.5  MCV 93.8 93.8  PLT 413* 401*    Cardiac Enzymes: No results for input(s): CKTOTAL, CKMB, CKMBINDEX, TROPONINI in the last 168 hours.  Lipid Panel: Recent Labs  Lab 07/14/19 0632  CHOL 200  TRIG 72  HDL 33*  CHOLHDL 6.1  VLDL 14  LDLCALC 153*    CBG: Recent Labs  Lab 07/13/19 0854 07/13/19 1202 07/13/19 1641 07/13/19 2105 07/14/19 0808  GLUCAP 224* 298* 358* 315* 314*    Microbiology: Results for orders placed or performed during the hospital encounter of 07/12/19  SARS Coronavirus 2 by RT PCR (  hospital order, performed in [facility] hospital lab) Nasopharyngeal Nasopharyngeal Swab     Status: None   Collection Time: 07/13/19  7:20 AM   Specimen: Nasopharyngeal Swab  Result Value Ref Range Status   SARS Coronavirus 2 NEGATIVE NEGATIVE Final    Comment: (NOTE) SARS-CoV-2 target nucleic acids are NOT DETECTED. The SARS-CoV-2 RNA is generally detectable in upper and lower respiratory specimens during the acute phase of infection. The lowest concentration of SARS-CoV-2 viral copies this assay can detect is 250 copies / mL. A negative result does not preclude SARS-CoV-2 infection and should not be used as the sole basis for treatment or other patient management decisions.  A negative result may occur with improper specimen collection / handling, submission of specimen other than nasopharyngeal swab, presence of viral mutation(s) within the areas targeted  by this assay, and inadequate number of viral copies (<250 copies / mL). A negative result must be combined with clinical observations, patient history, and epidemiological information. Fact Sheet for Patients:   StrictlyIdeas.no Fact Sheet for Healthcare Providers: BankingDealers.co.za This test is not yet approved or cleared  by the [place] FDA and has been authorized for detection and/or diagnosis of SARS-CoV-2 by FDA under an Emergency Use Authorization (EUA).  This EUA will remain in effect (meaning this test can be used) for the duration of the COVID-19 declaration under Section 564(b)(1) of the Act, 21 U.S.C. section 360bbb-3(b)(1), unless the authorization is terminated or revoked sooner. Performed at [facility], [address] [place],  [address]     Coagulation Studies: No results for input(s): LABPROT, INR in the last 72 hours.  Imaging: CT HEAD WO CONTRAST  Result Date: 07/13/2019 CLINICAL DATA:  Headache EXAM: CT HEAD WITHOUT CONTRAST TECHNIQUE: Contiguous axial images were obtained from the base of the skull through the vertex without intravenous contrast. COMPARISON:  None. FINDINGS: Brain: Subtle area of white matter hypoattenuation involving the right frontal lobe adjacent to the anterior horn of the lateral ventricle. No extra-axial collections or midline shift. There is a large area of hypoattenuation involving the left cerebellum with slight effacement of the fourth ventricle. Patchy white matter hypoattenuation seen throughout. Vascular: No hyperdense vessel or unexpected calcification. Skull: The skull is intact. No fracture or focal lesion identified. Sinuses/Orbits: The visualized paranasal sinuses and mastoid air cells are clear. The orbits and globes intact. Other: None IMPRESSION: Areas of hypodensity involving the right frontal lobe and posterior left cerebellum with slight mass effect upon  the posterior right ventricle. This could be due to subacute infarct versus edema from underlying lesion. For further evaluation would recommend MRI. Electronically Signed   By: Prudencio Pair M.D.   On: 07/13/2019 03:47   MR BRAIN W WO CONTRAST  Result Date: 07/13/2019 CLINICAL DATA:  Abnormal CT EXAM: MRI HEAD WITHOUT AND WITH CONTRAST TECHNIQUE: Multiplanar, multiecho pulse sequences of the brain and surrounding structures were obtained without and with intravenous contrast. CONTRAST:  4mL GADAVIST GADOBUTROL 1 MMOL/ML IV SOLN COMPARISON:  Correlation made with CT earlier same day FINDINGS: Brain: There is a moderate size area of restricted diffusion within the inferior left cerebellar hemisphere. There is no associated enhancement. Mass effect is mild without ascending or descending herniation or hydrocephalus. 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 Additional patchy and confluent T2 hyperintensity in the supratentorial white matter is nonspecific but probably reflects mild to moderate chronic microvascular ischemic changes. Ventricles and sulci are within normal  limits in size and configuration. There is no extra-axial fluid collection. There is no intracranial mass, mass effect, or edema. There is no hydrocephalus or extra-axial fluid collection. No abnormal enhancement. Vascular: Major vessel flow voids at the skull base are preserved. Skull and upper cervical spine: Normal marrow signal is preserved. Sinuses/Orbits: Paranasal sinuses are aerated. Orbits are unremarkable. Other: Sella is unremarkable.  Mastoid air cells are clear. IMPRESSION: Acute infarction of inferior left cerebellum. Minor associated petechial hemorrhage. Mass effect is present without herniation or hydrocephalus. Mild to moderate chronic microvascular ischemic changes. Small right frontal chronic infarct. Electronically Signed   By: Macy Mis M.D.    On: 07/13/2019 10:35   US Carotid Bilateral  Result Date: 07/14/2019 CLINICAL DATA:  Recent stroke EXAM: BILATERAL CAROTID DUPLEX ULTRASOUND TECHNIQUE: Pearline Cables scale imaging, color Doppler and duplex ultrasound were performed of bilateral carotid and vertebral arteries in the neck. COMPARISON:  None FINDINGS: Criteria: Quantification of carotid stenosis is based on velocity parameters that correlate the residual internal carotid diameter with NASCET-based stenosis levels, using the diameter of the distal internal carotid lumen as the denominator for stenosis measurement. The following velocity measurements were obtained: RIGHT ICA: 72/31 cm/sec CCA: [id number] cm/sec SYSTOLIC ICA/CCA RATIO:  1.5 ECA: 51 cm/sec LEFT ICA: 56/26 cm/sec CCA: [id number] cm/sec SYSTOLIC ICA/CCA RATIO:  1.2 ECA: 56 cm/sec RIGHT CAROTID ARTERY: Examination of the preliminary grayscale images demonstrate mild intimal thickening within the common carotid artery. Scattered calcified plaque is noted within the common and internal carotid arteries. The waveforms, velocities and flow velocity ratios however demonstrate no evidence of focal hemodynamically significant stenosis. RIGHT VERTEBRAL ARTERY:  Antegrade in nature. LEFT CAROTID ARTERY: Preliminary grayscale images demonstrate minimal atherosclerotic plaque in the region of the carotid bulb. The waveforms, velocities and flow velocity ratios however demonstrate no evidence of focal hemodynamically significant stenosis. LEFT VERTEBRAL ARTERY:  Antegrade in nature. IMPRESSION: Mild atherosclerotic plaque bilaterally without evidence of focal hemodynamically significant stenosis. Electronically Signed   By: Inez Catalina M.D.   On: 07/14/2019 02:15    Medications:  I have reviewed the patient's current medications. Scheduled: . aspirin  81 mg Oral Daily  . atorvastatin  40 mg Oral Daily  . benazepril  10 mg Oral Daily  . feeding supplement (GLUCERNA SHAKE)  237 mL Oral TID BM  . gabapentin   1,200 mg Oral QHS  . insulin aspart  0-5 Units Subcutaneous QHS  . insulin aspart  0-9 Units Subcutaneous TID WC  . multivitamin with minerals  1 tablet Oral Daily    Assessment/Plan: 59 y.o. 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 Found to be in DKA.  Noted to have evidence of infarcts on head CT.  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  No evidence of herniation or hydrocephalus.  Mass effect is noted as well as some petechial hemorrhage.  Etiology likely small vessel disease due to poorly controlled risk factors.  Patient on no antiplatelet therapy prior to admission.  BP elevated.  Carotid dopplers show no evidence of hemodynamically significant stenosis.  Echocardiogram pending.  LDL 153.A1c 13.5.  Stroke Risk Factors - diabetes mellitus,  hypertension and smoking  Plan: 1. Statin for lipid management with target LDL<70. 2. Blood sugar management with target A1c<7.0 3. Echocardiogram with bubble study pending 4. Continue therapy 5. Prophylactic therapy-ASA 81 mg 6. Telemetry monitoring 7. Frequent neuro checks 8. Blood pressure management with target BP<140/80 9. Ultram 50mg  po now for headache   LOS: 1 day   Alexis Goodell, MD Neurology [phone] 07/14/2019  11:10 AM

## 2019-07-14 NOTE — Progress Notes (Signed)
OT Cancellation Note  Patient Details Name: Brittany [name] MRN: [id number] DOB: 04/24/1960   Cancelled Treatment:    Reason Eval/Treat Not Completed: Pain limiting ability to participate;Medical issues which prohibited therapy. Consult received, chart reviewed. Pt noted with BP 160/97, target BP <140/80. Spoke with RN. Pt with significant pain and nausea this am, vomited up pain meds she received. Will hold OT evaluation this am and re-attempt at later date/time as pt is more medically appropriate. RN in agreement.  Jeni Salles, MPH, MS, OTR/L ascom [phone] 07/14/19, 9:01 AM

## 2019-07-15 DIAGNOSIS — I639 Cerebral infarction, unspecified: Secondary | ICD-10-CM

## 2019-07-15 DIAGNOSIS — E1110 Type 2 diabetes mellitus with ketoacidosis without coma: Secondary | ICD-10-CM

## 2019-07-15 DIAGNOSIS — L930 Discoid lupus erythematosus: Secondary | ICD-10-CM

## 2019-07-15 LAB — URINALYSIS, COMPLETE (UACMP) WITH MICROSCOPIC
Bacteria, UA: NONE SEEN
Bilirubin Urine: NEGATIVE
Glucose, UA: >=500 mg/dL — AB
Hgb urine dipstick: NEGATIVE
Ketones, ur: NEGATIVE mg/dL
Leukocytes,Ua: NEGATIVE
Nitrite: NEGATIVE
Protein, ur: NEGATIVE mg/dL
Specific Gravity, Urine: 1.002 — ABNORMAL LOW (ref 1.005–1.030)
pH: 6 (ref 5.0–8.0)

## 2019-07-15 LAB — GLUCOSE, CAPILLARY
Glucose-Capillary: 225 mg/dL — ABNORMAL HIGH (ref 70–99)
Glucose-Capillary: 250 mg/dL — ABNORMAL HIGH (ref 70–99)
Glucose-Capillary: 369 mg/dL — ABNORMAL HIGH (ref 70–99)
Glucose-Capillary: 149 mg/dL — ABNORMAL HIGH (ref 70–99)

## 2019-07-15 MED ORDER — ENOXAPARIN SODIUM 40 MG/0.4ML ~~LOC~~ SOLN
40.0000 mg | SUBCUTANEOUS | Status: DC
  Administered 2019-07-15 – 2019-07-19 (×5): 40 mg via SUBCUTANEOUS
  Filled 2019-07-15 (×5): qty 0.4

## 2019-07-15 MED ORDER — INSULIN ASPART PROT & ASPART (70-30 MIX) 100 UNIT/ML ~~LOC~~ SUSP
22.0000 [IU] | Freq: Two times a day (BID) | SUBCUTANEOUS | Status: DC
  Administered 2019-07-15 – 2019-07-16 (×2): 22 [IU] via SUBCUTANEOUS
  Filled 2019-07-15 (×2): qty 10

## 2019-07-15 MED ORDER — KETOROLAC TROMETHAMINE 15 MG/ML IJ SOLN
15.0000 mg | Freq: Four times a day (QID) | INTRAMUSCULAR | Status: DC | PRN
  Administered 2019-07-15 – 2019-07-18 (×2): 15 mg via INTRAVENOUS
  Filled 2019-07-15 (×5): qty 1

## 2019-07-15 MED ORDER — ATORVASTATIN CALCIUM 20 MG PO TABS
80.0000 mg | ORAL_TABLET | Freq: Every day | ORAL | Status: DC
  Administered 2019-07-16 – 2019-07-20 (×5): 80 mg via ORAL
  Filled 2019-07-15 (×5): qty 4

## 2019-07-15 NOTE — Progress Notes (Signed)
Physical Therapy Treatment Patient Details Name: Brittany [name] MRN: [id number] DOB: Apr 24, 1960 Today's Date: 07/15/2019    History of Present Illness Pt admitted for DKA with + CVA in L cerebellum along with petchial hemorrhage. Initial complaints includes HA, fall, and L arm numbness (approx 2 months). PMH includes partail pancreatectomy, Lupus, DM, and HTN. New CT revealed 37mm cerebellar shift to R of midling. CLeared to participate via neuro    PT Comments    Pt is making gradual progress towards goals. Continues to be limited due to severe headache. Also complains of nausea with mobility efforts. Was completely independent prior to admission and is currently not at baseline level. Needed increased assist for mobility efforts this date and unsteady/ataxic with gait. Limited by fatigue and weakness. Weakness with increased present during functional tasks vs bed level MMT. Due to change in status, would be appropriate for CIR recommendation.   Follow Up Recommendations  CIR     Equipment Recommendations  Rolling walker with 5" wheels    Recommendations for Other Services       Precautions / Restrictions Precautions Precautions: Fall Restrictions Weight Bearing Restrictions: No    Mobility  Bed Mobility Overal bed mobility: Needs Assistance Bed Mobility: Supine to Sit;Sit to Supine     Supine to sit: Mod assist     General bed mobility comments: takes increased time to perform with multiple bouts of crying noted in between attempts. Needs assist for trunk support. Reports increased headache with mobility  Transfers Overall transfer level: Needs assistance Equipment used: Rolling walker (2 wheeled) Transfers: Sit to/from Stand Sit to Stand: Mod assist         General transfer comment: needs cues for hand placement prior to transfer. Once standing, unsteady and needs mod assist to maintain balance.  Ambulation/Gait Ambulation/Gait assistance: Min assist Gait  Distance (Feet): 3 Feet Assistive device: Rolling walker (2 wheeled) Gait Pattern/deviations: Step-to pattern     General Gait Details: very unsteady and scared to ambulate. B LE brace against bed. Slow step to gait pattern noted   Stairs             Wheelchair Mobility    Modified Rankin (Stroke Patients Only)       Balance Overall balance assessment: Needs assistance;History of Falls Sitting-balance support: Feet unsupported;Single extremity supported Sitting balance-Leahy Scale: Fair     Standing balance support: Bilateral upper extremity supported Standing balance-Leahy Scale: Fair                              Cognition Arousal/Alertness: Awake/alert Behavior During Therapy: WFL for tasks assessed/performed Overall Cognitive Status: Within Functional Limits for tasks assessed                                 General Comments: very emotional this date, on/off crying      Exercises Other Exercises Other Exercises: seated ther-ex performed including LAQ x 10 with supervision. Other Exercises: Ambulated bed->chair and then wheeled over to sink to wash her body. Pt only able to tolerate standing a few seconds prior to fatigue with min assist. Able to lean forward from seated position to reach sink and unsnap gown. CNA called in to finish task    General Comments        Pertinent Vitals/Pain Pain Assessment: 0-10 Pain Score: 10-Worst pain ever Pain Location: R side headache  Pain Descriptors / Indicators: Constant;Discomfort Pain Intervention(s): Limited activity within patient's tolerance;Repositioned    Home Living                      Prior Function            PT Goals (current goals can now be found in the care plan section) Acute Rehab PT Goals Patient Stated Goal: feel better PT Goal Formulation: With patient Time For Goal Achievement: 07/28/19 Potential to Achieve Goals: Good Progress towards PT goals:  Progressing toward goals    Frequency    7X/week      PT Plan Discharge plan needs to be updated    Co-evaluation              AM-PAC PT "6 Clicks" Mobility   Outcome Measure  Help needed turning from your back to your side while in a flat bed without using bedrails?: A Little Help needed moving from lying on your back to sitting on the side of a flat bed without using bedrails?: A Little Help needed moving to and from a bed to a chair (including a wheelchair)?: A Little Help needed standing up from a chair using your arms (e.g., wheelchair or bedside chair)?: A Little Help needed to walk in hospital room?: A Little Help needed climbing 3-5 steps with a railing? : A Lot 6 Click Score: 17    End of Session Equipment Utilized During Treatment: Gait belt Activity Tolerance: Patient tolerated treatment well Patient left: in chair;with chair alarm set Nurse Communication: Mobility status PT Visit Diagnosis: Muscle weakness (generalized) (M62.81);Difficulty in walking, not elsewhere classified (R26.2);Ataxic gait (R26.0);Unsteadiness on feet (R26.81);History of falling (Z91.81);Pain Pain - Right/Left: Right Pain - part of body: (headache)     Time: [id number] PT Time Calculation (min) (ACUTE ONLY): 40 min  Charges:  $Gait Training: 8-22 mins $Therapeutic Exercise: 8-22 mins $Therapeutic Activity: 8-22 mins                     Greggory Stallion, PT, DPT [phone]    Brittany [name] 07/15/2019, 3:45 PM

## 2019-07-15 NOTE — Progress Notes (Signed)
PROGRESS NOTE    Brittany [name]  EKC:[id number] DOB: 1960-06-06 DOA: 07/12/2019 PCP: [facility]   Chief complaint.  Nausea vomiting.  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   She was treated in the emergency room with the insulin and IV fluids, her anion gap was normalized and she was admitted to the hospital for further treatment.  Also had a CT head foot leg weakness, showed a left cerebellar stroke.  Which was confirmed by MRI.  Carotid ultrasound did not show significant occlusion.  Patient has been followed by neurology.    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 Anion gap closed after initial treatment in the emergency room.  Glucose still running high, A1c 13.5.  I will increase NovoLog 70/30 to 22 units twice a day.  Continue to monitor glucose.  2.  Acute left cerebellar stroke. Patient had a worsening nausea vomiting last night.  Repeated CT scan showed worsening cerebellar edema with midline shift.  I have notified neurology about this results.  Dr. Irish Elders will let me know if patient need to be transferred to [facility] hospital.   3.  Nausea vomiting. Initially secondary to  DKA and a stroke.  Had nausea vomiting last night due to intracranial edema.  Continue symptomatic treatment.  4.  Essential hypertension. Medication increased in dose yesterday.  Continue to follow.  5.  Hypokalemia. Resolved.  6.  Discoid lupus erythematous. Continue Plaquenil.       DVT prophylaxis: Lovenox Code Status: Full Family Communication: None Disposition Plan:  . Patient came from: Home            . Anticipated d/c place: Home . Barriers to d/c OR conditions which need to be met to effect a safe d/c:   Consultants:   Neurology  Procedures: None Antimicrobials: None  Subjective: Patient had a worsening nausea vomiting last night, still nauseated today. Denies any short of breath or cough. No abdominal pain or nausea vomiting. No fever or chills. No dysuria hematuria. No confusion or agitation.   Objective: Vitals:   07/14/19 1424 07/14/19 2015 07/14/19 2315 07/15/19 0738  BP: 138/83 (!) 166/110 122/84 (!) 149/90  Pulse: 78  74   Resp: '16 15 17 17  ' Temp: 98.6 F (37 C)  98.2 F (36.8 C) 98.1 F (36.7 C)  TempSrc: Oral  Oral Oral  SpO2:   100% 100%  Weight:      Height:        Intake/Output Summary (Last 24 hours) at 07/15/2019 0958 Last data filed at 07/15/2019 0300 Gross per 24 hour  Intake 290 ml  Output 900 ml  Net -610 ml   Filed Weights   07/12/19 1506 07/13/19 1125  Weight: 45.4 kg 47.3 kg    Examination:  General exam: Appears calm and comfortable  Respiratory  system: Clear to auscultation. Respiratory effort normal. Cardiovascular system: S1 & S2 heard, RRR. No JVD, murmurs, rubs, gallops or clicks. No pedal edema. Gastrointestinal system: Abdomen is nondistended, soft and nontender. No organomegaly or masses felt. Normal bowel sounds heard. Central nervous system: Alert and oriented. No focal neurological deficits. Extremities: Symmetric  Skin: No rashes, lesions or ulcers Psychiatry: Judgement and insight appear normal.  Mood & affect appropriate.     Data Reviewed: I have personally reviewed following labs and imaging studies  CBC: Recent Labs  Lab 07/12/19 1558 07/13/19 0517  WBC 5.4 5.4  HGB 17.1* 13.9  HCT 49.6* 40.5  MCV 93.8 93.8  PLT 413* 416*   Basic Metabolic Panel: Recent Labs  Lab 07/12/19 1558 07/12/19 2024 07/13/19 0517 07/14/19 0632  NA 131* 133* 133* 132*  K 3.8 2.9* 4.9 3.8  CL 90* 100 101 96*  CO2 16* '24 23 26  ' GLUCOSE 406* 237* 231* 308*  BUN '19 18 17 6  ' CREATININE 0.90 0.61 0.67 0.47  CALCIUM 9.3 7.8* 8.3* 8.4*   GFR: Estimated Creatinine Clearance: 54.4 mL/min (by C-G formula based on SCr of 0.47 mg/dL). Liver Function Tests: Recent Labs  Lab 07/12/19 1558  AST 20  ALT 16  ALKPHOS 76  BILITOT 2.1*  PROT 8.2*  ALBUMIN 3.8   Recent Labs  Lab 07/12/19 1558  LIPASE 49   No results for input(s): AMMONIA in the last 168 hours. Coagulation Profile: No results for input(s): INR, PROTIME in the last 168 hours. Cardiac Enzymes: No results for input(s): CKTOTAL, CKMB, CKMBINDEX, TROPONINI in the last 168 hours. BNP (last 3 results) No results for input(s): PROBNP in the last 8760 hours. HbA1C: Recent Labs    07/13/19 0517  HGBA1C 13.5*   CBG: Recent Labs  Lab 07/14/19 1158 07/14/19 1705 07/14/19 1958 07/14/19 2238 07/15/19 0737  GLUCAP 271* 332* 237* 254* 225*   Lipid Profile: Recent Labs    07/14/19 0632  CHOL 200  HDL 33*  LDLCALC 153*  TRIG 72  CHOLHDL 6.1   Thyroid Function Tests: No results for input(s): TSH, T4TOTAL, FREET4, T3FREE, THYROIDAB in the last 72 hours. Anemia Panel: No results for input(s): VITAMINB12, FOLATE, FERRITIN, TIBC, IRON, RETICCTPCT in the last 72 hours. Sepsis Labs: No results for input(s): PROCALCITON, LATICACIDVEN in the last 168 hours.  Recent Results (from the past 240 hour(s))  SARS Coronavirus 2 by RT PCR (hospital order, performed in [facility] hospital lab) Nasopharyngeal Nasopharyngeal Swab      Status: None   Collection Time: 07/13/19  7:20 AM   Specimen: Nasopharyngeal Swab  Result Value Ref Range Status   SARS Coronavirus 2 NEGATIVE NEGATIVE Final    Comment: (NOTE) SARS-CoV-2 target nucleic acids are NOT DETECTED. The SARS-CoV-2 RNA is generally detectable in upper and lower respiratory specimens during the acute phase of infection. The lowest concentration of SARS-CoV-2 viral copies this assay can detect is 250 copies / mL. A negative result does not preclude SARS-CoV-2 infection and should not be used as the sole basis for treatment or other patient management decisions.  A negative result may occur with improper specimen collection / handling, submission of specimen other than nasopharyngeal swab, presence of viral mutation(s) within the areas targeted by this assay, and inadequate number of viral copies (<250 copies / mL). A negative result must be combined with clinical observations, patient history, and epidemiological information. Fact Sheet for Patients:   StrictlyIdeas.no Fact Sheet for Healthcare Providers: BankingDealers.co.za This test is  not yet approved or cleared  by the [place] and has been authorized for detection and/or diagnosis of SARS-CoV-2 by FDA under an Emergency Use Authorization (EUA).  This EUA will remain in effect (meaning this test can be used) for the duration of the COVID-19 declaration under Section 564(b)(1) of the Act, 21 U.S.C. section 360bbb-3(b)(1), unless the authorization is terminated or revoked sooner. Performed at [facility], [address] [place], [place] [address]          Radiology Studies: CT HEAD WO CONTRAST  Result Date: 07/14/2019 CLINICAL DATA:  58 year old female with headache. Follow-up stroke. EXAM: CT HEAD WITHOUT CONTRAST TECHNIQUE: Contiguous axial images were obtained from the base of the skull through the vertex without intravenous  contrast. COMPARISON:  Head CT dated 07/13/2019. FINDINGS: Brain: Large area of edema involving the left cerebellum related to recent infarct. Overall increased in the amount of edema with associated mass effect and approximately 12 mm left right cerebellar shift. There is also mass effect on the fourth ventricle. No evidence of transtentorial herniation or inferior herniation through the foramen magnum. No significant ventricular dilatation. There is otherwise mild diffuse chronic microvascular ischemic changes and an area of old infarct or chronic microvascular ischemic changes involving the right frontal lobe. There is no acute intracranial hemorrhage. No subfalcine herniation. Vascular: No hyperdense vessel or unexpected calcification. Skull: Normal. Negative for fracture or focal lesion. Sinuses/Orbits: No acute finding. Other: None IMPRESSION: 1. Increased edema in the left cerebellum with approximately 12 mm cerebellar shift to the right of the midline. Continued close follow-up recommended. 2. No acute bleed.  No hydrocephalus. Electronically Signed   By: Anner Crete M.D.   On: 07/14/2019 20:48   MR BRAIN W WO CONTRAST  Result Date: 07/13/2019 CLINICAL DATA:  Abnormal CT EXAM: MRI HEAD WITHOUT AND WITH CONTRAST TECHNIQUE: Multiplanar, multiecho pulse sequences of the brain and surrounding structures were obtained without and with intravenous contrast. CONTRAST:  89m GADAVIST GADOBUTROL 1 MMOL/ML IV SOLN COMPARISON:  Correlation made with CT earlier same day FINDINGS: Brain: There is a moderate size area of restricted diffusion within the inferior left cerebellar hemisphere. There is no associated enhancement. Mass effect is mild without ascending or descending herniation or hydrocephalus. 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 Additional patchy and confluent T2 hyperintensity in the supratentorial white matter is  nonspecific but probably reflects mild to moderate chronic microvascular ischemic changes. Ventricles and sulci are within normal limits in size and configuration. There is no extra-axial fluid collection. There is no intracranial mass, mass effect, or edema. There is no hydrocephalus or extra-axial fluid collection. No abnormal enhancement. Vascular: Major vessel flow voids at the skull base are preserved. Skull and upper cervical spine: Normal marrow signal is preserved. Sinuses/Orbits: Paranasal sinuses are aerated. Orbits are unremarkable. Other: Sella is unremarkable.  Mastoid air cells are clear. IMPRESSION: Acute infarction of inferior left cerebellum. Minor associated petechial hemorrhage. Mass effect is present without herniation or hydrocephalus. Mild to moderate chronic microvascular ischemic changes. Small right frontal chronic infarct. Electronically Signed   By: PMacy MisM.D.   On: 07/13/2019 10:35   [place]otid Bilateral  Result Date: 07/14/2019 CLINICAL DATA:  Recent stroke EXAM: BILATERAL CAROTID DUPLEX ULTRASOUND TECHNIQUE: GPearline Cablesscale imaging, color Doppler and duplex ultrasound were performed of bilateral carotid and vertebral arteries in the neck. COMPARISON:  None FINDINGS: Criteria: Quantification of carotid stenosis is based on velocity parameters  that correlate the residual internal carotid diameter with NASCET-based stenosis levels, using the diameter of the distal internal carotid lumen as the denominator for stenosis measurement. The following velocity measurements were obtained: RIGHT ICA: 72/31 cm/sec CCA: 24/26 cm/sec SYSTOLIC ICA/CCA RATIO:  1.5 ECA: 51 cm/sec LEFT ICA: 56/26 cm/sec CCA: 83/41 cm/sec SYSTOLIC ICA/CCA RATIO:  1.2 ECA: 56 cm/sec RIGHT CAROTID ARTERY: Examination of the preliminary grayscale images demonstrate mild intimal thickening within the common carotid artery. Scattered calcified plaque is noted within the common and internal carotid arteries. The  waveforms, velocities and flow velocity ratios however demonstrate no evidence of focal hemodynamically significant stenosis. RIGHT VERTEBRAL ARTERY:  Antegrade in nature. LEFT CAROTID ARTERY: Preliminary grayscale images demonstrate minimal atherosclerotic plaque in the region of the carotid bulb. The waveforms, velocities and flow velocity ratios however demonstrate no evidence of focal hemodynamically significant stenosis. LEFT VERTEBRAL ARTERY:  Antegrade in nature. IMPRESSION: Mild atherosclerotic plaque bilaterally without evidence of focal hemodynamically significant stenosis. Electronically Signed   By: Inez Catalina M.D.   On: 07/14/2019 02:15   ECHOCARDIOGRAM COMPLETE BUBBLE STUDY  Result Date: 07/14/2019    ECHOCARDIOGRAM REPORT   Patient Name:   Brittany [name] Date of Exam: 07/14/2019 Medical Rec #:  [id number]     Height:       60.0 in Accession #:    [id number]    Weight:       104.3 lb Date of Birth:  [birth year]    BSA:          1.415 m Patient Age:    37 years      BP:           160/97 mmHg Patient Gender: F             HR:           76 bpm. Exam Location:  [facility] Procedure: 2D Echo, Color Doppler and Cardiac Doppler Indications:     Stroke 434.91  History:         Patient has no prior history of Echocardiogram examinations.                  Risk Factors:Hypertension and Diabetes.  Sonographer:     Sherrie Sport RDCS (AE) Referring Phys:  [id number] [facility] AMERY Diagnosing Phys: Nelva Bush MD IMPRESSIONS  1. Left ventricular ejection fraction, by estimation, is 55 to 60%. The left ventricle has normal function. The left ventricle has no regional wall motion abnormalities. There is mild left ventricular hypertrophy. Left ventricular diastolic parameters are consistent with age-related delayed relaxation (normal).  2. Right ventricular systolic function is normal. The right ventricular size is normal. There is moderately elevated pulmonary artery systolic pressure.  3. The mitral valve is grossly normal.  Trivial mitral valve regurgitation. No evidence of mitral stenosis.  4. The aortic valve is tricuspid. Aortic valve regurgitation is not visualized. No aortic stenosis is present.  5. The inferior vena cava is normal in size with greater than 50% respiratory variability, suggesting right atrial pressure of 3 mmHg.  6. Agitated saline contrast bubble study was negative, with no evidence of any interatrial shunt. FINDINGS  Left Ventricle: Left ventricular ejection fraction, by estimation, is 55 to 60%. The left ventricle has normal function. The left ventricle has no regional wall motion abnormalities. The left ventricular internal cavity size was normal in size. There is  mild left ventricular hypertrophy. Left ventricular diastolic parameters are consistent with age-related delayed relaxation (normal). Right Ventricle: The  right ventricular size is normal. No increase in right ventricular wall thickness. Right ventricular systolic function is normal. There is moderately elevated pulmonary artery systolic pressure. The tricuspid regurgitant velocity is 3.30 m/s, and with an assumed right atrial pressure of 3 mmHg, the estimated right ventricular systolic pressure is 83.3 mmHg. Left Atrium: Left atrial size was normal in size. Right Atrium: Right atrial size was normal in size. Pericardium: There is no evidence of pericardial effusion. Mitral Valve: The mitral valve is grossly normal. There is mild thickening of the mitral valve leaflet(s). Trivial mitral valve regurgitation. No evidence of mitral valve stenosis. Tricuspid Valve: The tricuspid valve is normal in structure. Tricuspid valve regurgitation is mild. Aortic Valve: The aortic valve is tricuspid. Aortic valve regurgitation is not visualized. No aortic stenosis is present. Aortic valve mean gradient measures 2.5 mmHg. Aortic valve peak gradient measures 4.0 mmHg. Aortic valve area, by VTI measures 3.03 cm. Pulmonic Valve: The pulmonic valve was not well  visualized. Pulmonic valve regurgitation is not visualized. No evidence of pulmonic stenosis. Aorta: The aortic root is normal in size and structure. Pulmonary Artery: The pulmonary artery is not well seen. Venous: The inferior vena cava is normal in size with greater than 50% respiratory variability, suggesting right atrial pressure of 3 mmHg. IAS/Shunts: No atrial level shunt detected by color flow Doppler. Agitated saline contrast was given intravenously to evaluate for intracardiac shunting. Agitated saline contrast bubble study was negative, with no evidence of any interatrial shunt.  LEFT VENTRICLE PLAX 2D LVIDd:         3.52 cm  Diastology LVIDs:         2.33 cm  LV e' lateral:   10.80 cm/s LV PW:         1.08 cm  LV E/e' lateral: 7.2 LV IVS:        0.78 cm  LV e' medial:    7.07 cm/s LVOT diam:     2.10 cm  LV E/e' medial:  11.0 LV SV:         51 LV SV Index:   36 LVOT Area:     3.46 cm  RIGHT VENTRICLE RV Basal diam:  2.80 cm RV S prime:     12.10 cm/s TAPSE (M-mode): 3.3 cm LEFT ATRIUM             Index       RIGHT ATRIUM           Index LA diam:        2.30 cm 1.63 cm/m  RA Area:     12.80 cm LA Vol (A2C):   32.2 ml 22.75 ml/m RA Volume:   27.30 ml  19.29 ml/m LA Vol (A4C):   38.7 ml 27.34 ml/m LA Biplane Vol: 38.1 ml 26.92 ml/m  AORTIC VALVE                   PULMONIC VALVE AV Area (Vmax):    2.82 cm    PV Vmax:        0.64 m/s AV Area (Vmean):   2.79 cm    PV Peak grad:   1.6 mmHg AV Area (VTI):     3.03 cm    RVOT Peak grad: 1 mmHg AV Vmax:           100.35 cm/s AV Vmean:          69.650 cm/s AV VTI:  0.169 m AV Peak Grad:      4.0 mmHg AV Mean Grad:      2.5 mmHg LVOT Vmax:         81.60 cm/s LVOT Vmean:        56.200 cm/s LVOT VTI:          0.148 m LVOT/AV VTI ratio: 0.88  AORTA Ao Root diam: 3.00 cm MITRAL VALVE                TRICUSPID VALVE MV Area (PHT): 2.82 cm     TR Peak grad:   43.6 mmHg MV Decel Time: 269 msec     TR Vmax:        330.00 cm/s MV E velocity: 78.00 cm/s MV  A velocity: 107.00 cm/s  SHUNTS MV E/A ratio:  0.73         Systemic VTI:  0.15 m                             Systemic Diam: 2.10 cm Nelva Bush MD Electronically signed by Nelva Bush MD Signature Date/Time: 07/14/2019/6:07:14 PM    Final         Scheduled Meds: . aspirin  81 mg Oral Daily  . atorvastatin  40 mg Oral Daily  . benazepril  20 mg Oral Daily  . feeding supplement (GLUCERNA SHAKE)  237 mL Oral TID BM  . gabapentin  1,200 mg Oral QHS  . insulin aspart  0-5 Units Subcutaneous QHS  . insulin aspart  0-9 Units Subcutaneous TID WC  . insulin aspart protamine- aspart  22 Units Subcutaneous BID WC  . meclizine  6.25 mg Oral TID  . multivitamin with minerals  1 tablet Oral Daily   Continuous Infusions:   LOS: 2 days    Time spent: 35 minutes    Sharen Hones, MD [facility]   To contact the attending provider between 7A-7P or the covering provider during after hours 7P-7A, please log into the web site [email or web address] and access using universal [place] password for that web site. If you do not have the password, please call the hospital operator.  07/15/2019, 9:58 AM

## 2019-07-15 NOTE — Progress Notes (Addendum)
   07/15/19 1725  Clinical Encounter Type  Visited With Patient  Visit Type Initial;Spiritual support;Social support  Referral From Chaplain  Consult/Referral To Chaplain  Visited Pt while rounding unit. Pt seem to be a lot better. Pt was happy with Ch presence. Pt said that things are getting better with her family. Pt asked Ch for Prayer. I prayed for patient strength. I will follow-up with Pt.

## 2019-07-15 NOTE — Progress Notes (Signed)
.  Inpatient Rehab Admissions Coordinator Note:   Per PT updated recommendations, pt was screened for CIR candidacy by Shann Medal, PT, DPT.  At this time we are recommending a CIR consult.  I will place an order per our protocol and we will follow up with patient tomorrow.  Please contact me with questions.   Shann Medal, PT, DPT [phone] 07/15/19 8:38 PM

## 2019-07-15 NOTE — Progress Notes (Signed)
Subjective: Still N/V. Headache improved. Pt is emotional today  Objective: Current vital signs: BP (!) 149/90 (BP Location: Left Arm)   Pulse 74   Temp 98.1 F (36.7 C) (Oral)   Resp 17   Ht 5' (1.524 m)   Wt 47.3 kg   SpO2 100%   BMI 20.37 kg/m  Vital signs in last 24 hours: Temp:  [98.1 F (36.7 C)-98.6 F (37 C)] 98.1 F (36.7 C) (06/02 0738) Pulse Rate:  [74-78] 74 (06/01 2315) Resp:  [15-17] 17 (06/02 0738) BP: (122-166)/(83-110) 149/90 (06/02 0738) SpO2:  [100 %] 100 % (06/02 0738)  Intake/Output from previous day: 06/01 0701 - 06/02 0700 In: 290 [P.O.:240; IV Piggyback:50] Out: 900 [Urine:900] Intake/Output this shift: Total I/O In: 240 [P.O.:240] Out: -  Nutritional status:  Diet Order            Diet Carb Modified Fluid consistency: Thin; Room service appropriate? Yes  Diet effective now              Neurologic Exam: Mental Status: Alert, oriented, thought content appropriate.  Speech fluent without evidence of aphasia.  Able to follow 3 step commands without difficulty. Cranial Nerves: II: Visual fields grossly normal, pupils equal, round, reactive to light and accommodation III,IV, VI: ptosis not present, extra-ocular motions intact bilaterally V,VII: mild decrease in right NLF, facial light touch sensation normal bilaterally VIII: hearing normal bilaterally IX,X: gag reflex present XI: bilateral shoulder shrug XII: midline tongue extension Motor: Right : Upper extremity   5/5    Left:     Upper extremity   5/5  Lower extremity   5/5     Lower extremity   5/5 Tone and bulk:normal tone throughout; no atrophy noted Sensory: Pinprick and light touch decreased in the fingertips of the LUE    Lab Results: Basic Metabolic Panel: Recent Labs  Lab 07/12/19 1558 07/12/19 1558 07/12/19 2024 07/13/19 0517 07/14/19 0632  NA 131*  --  133* 133* 132*  K 3.8  --  2.9* 4.9 3.8  CL 90*  --  100 101 96*  CO2 16*  --  24 23 26   GLUCOSE 406*  --   237* 231* 308*  BUN 19  --  18 17 6   CREATININE 0.90  --  0.61 0.67 0.47  CALCIUM 9.3   < > 7.8* 8.3* 8.4*   < > = values in this interval not displayed.    Liver Function Tests: Recent Labs  Lab 07/12/19 1558  AST 20  ALT 16  ALKPHOS 76  BILITOT 2.1*  PROT 8.2*  ALBUMIN 3.8   Recent Labs  Lab 07/12/19 1558  LIPASE 49   No results for input(s): AMMONIA in the last 168 hours.  CBC: Recent Labs  Lab 07/12/19 1558 07/13/19 0517  WBC 5.4 5.4  HGB 17.1* 13.9  HCT 49.6* 40.5  MCV 93.8 93.8  PLT 413* 401*    Cardiac Enzymes: No results for input(s): CKTOTAL, CKMB, CKMBINDEX, TROPONINI in the last 168 hours.  Lipid Panel: Recent Labs  Lab 07/14/19 0632  CHOL 200  TRIG 72  HDL 33*  CHOLHDL 6.1  VLDL 14  LDLCALC 153*    CBG: Recent Labs  Lab 07/14/19 1158 07/14/19 1705 07/14/19 1958 07/14/19 2238 07/15/19 0737  GLUCAP 271* 332* 237* 254* 225*    Microbiology: Results for orders placed or performed during the hospital encounter of 07/12/19  SARS Coronavirus 2 by RT PCR (hospital order, performed in [facility] hospital  lab) Nasopharyngeal Nasopharyngeal Swab     Status: None   Collection Time: 07/13/19  7:20 AM   Specimen: Nasopharyngeal Swab  Result Value Ref Range Status   SARS Coronavirus 2 NEGATIVE NEGATIVE Final    Comment: (NOTE) SARS-CoV-2 target nucleic acids are NOT DETECTED. The SARS-CoV-2 RNA is generally detectable in upper and lower respiratory specimens during the acute phase of infection. The lowest concentration of SARS-CoV-2 viral copies this assay can detect is 250 copies / mL. A negative result does not preclude SARS-CoV-2 infection and should not be used as the sole basis for treatment or other patient management decisions.  A negative result may occur with improper specimen collection / handling, submission of specimen other than nasopharyngeal swab, presence of viral mutation(s) within the areas targeted by this assay, and  inadequate number of viral copies (<250 copies / mL). A negative result must be combined with clinical observations, patient history, and epidemiological information. Fact Sheet for Patients:   StrictlyIdeas.no Fact Sheet for Healthcare Providers: BankingDealers.co.za This test is not yet approved or cleared  by the [place] FDA and has been authorized for detection and/or diagnosis of SARS-CoV-2 by FDA under an Emergency Use Authorization (EUA).  This EUA will remain in effect (meaning this test can be used) for the duration of the COVID-19 declaration under Section 564(b)(1) of the Act, 21 U.S.C. section 360bbb-3(b)(1), unless the authorization is terminated or revoked sooner. Performed at [facility], [address] [place], [place] [address]     Coagulation Studies: No results for input(s): LABPROT, INR in the last 72 hours.  Imaging: CT HEAD WO CONTRAST  Result Date: 07/14/2019 CLINICAL DATA:  59 year old female with headache. Follow-up stroke. EXAM: CT HEAD WITHOUT CONTRAST TECHNIQUE: Contiguous axial images were obtained from the base of the skull through the vertex without intravenous contrast. COMPARISON:  Head CT dated 07/13/2019. FINDINGS: Brain: Large area of edema involving the left cerebellum related to recent infarct. Overall increased in the amount of edema with associated mass effect and approximately 12 mm left right cerebellar shift. There is also mass effect on the fourth ventricle. No evidence of transtentorial herniation or inferior herniation through the foramen magnum. No significant ventricular dilatation. There is otherwise mild diffuse chronic microvascular ischemic changes and an area of old infarct or chronic microvascular ischemic changes involving the right frontal lobe. There is no acute intracranial hemorrhage. No subfalcine herniation. Vascular: No hyperdense vessel or unexpected calcification.  Skull: Normal. Negative for fracture or focal lesion. Sinuses/Orbits: No acute finding. Other: None IMPRESSION: 1. Increased edema in the left cerebellum with approximately 12 mm cerebellar shift to the right of the midline. Continued close follow-up recommended. 2. No acute bleed.  No hydrocephalus. Electronically Signed   By: Anner Crete M.D.   On: 07/14/2019 20:48   US Carotid Bilateral  Result Date: 07/14/2019 CLINICAL DATA:  Recent stroke EXAM: BILATERAL CAROTID DUPLEX ULTRASOUND TECHNIQUE: Pearline Cables scale imaging, color Doppler and duplex ultrasound were performed of bilateral carotid and vertebral arteries in the neck. COMPARISON:  None FINDINGS: Criteria: Quantification of carotid stenosis is based on velocity parameters that correlate the residual internal carotid diameter with NASCET-based stenosis levels, using the diameter of the distal internal carotid lumen as the denominator for stenosis measurement. The following velocity measurements were obtained: RIGHT ICA: 72/31 cm/sec CCA: 123XX123 cm/sec SYSTOLIC ICA/CCA RATIO:  1.5 ECA: 51 cm/sec LEFT ICA: 56/26 cm/sec CCA: [id number] cm/sec SYSTOLIC ICA/CCA RATIO:  1.2 ECA: 56 cm/sec RIGHT CAROTID ARTERY: Examination  of the preliminary grayscale images demonstrate mild intimal thickening within the common carotid artery. Scattered calcified plaque is noted within the common and internal carotid arteries. The waveforms, velocities and flow velocity ratios however demonstrate no evidence of focal hemodynamically significant stenosis. RIGHT VERTEBRAL ARTERY:  Antegrade in nature. LEFT CAROTID ARTERY: Preliminary grayscale images demonstrate minimal atherosclerotic plaque in the region of the carotid bulb. The waveforms, velocities and flow velocity ratios however demonstrate no evidence of focal hemodynamically significant stenosis. LEFT VERTEBRAL ARTERY:  Antegrade in nature. IMPRESSION: Mild atherosclerotic plaque bilaterally without evidence of focal  hemodynamically significant stenosis. Electronically Signed   By: Inez Catalina M.D.   On: 07/14/2019 02:15   ECHOCARDIOGRAM COMPLETE BUBBLE STUDY  Result Date: 07/14/2019    ECHOCARDIOGRAM REPORT   Patient Name:   Brittany [name] Date of Exam: 07/14/2019 Medical Rec #:  [id number]     Height:       60.0 in Accession #:    [id number]    Weight:       104.3 lb Date of Birth:  1960-09-05    BSA:          1.415 m Patient Age:    7 years      BP:           160/97 mmHg Patient Gender: F             HR:           76 bpm. Exam Location:  [facility] Procedure: 2D Echo, Color Doppler and Cardiac Doppler Indications:     Stroke 434.91  History:         Patient has no prior history of Echocardiogram examinations.                  Risk Factors:Hypertension and Diabetes.  Sonographer:     Sherrie Sport RDCS (AE) Referring Phys:  [id number] [facility] AMERY Diagnosing Phys: Nelva Bush MD IMPRESSIONS  1. Left ventricular ejection fraction, by estimation, is 55 to 60%. The left ventricle has normal function. The left ventricle has no regional wall motion abnormalities. There is mild left ventricular hypertrophy. Left ventricular diastolic parameters are consistent with age-related delayed relaxation (normal).  2. Right ventricular systolic function is normal. The right ventricular size is normal. There is moderately elevated pulmonary artery systolic pressure.  3. The mitral valve is grossly normal. Trivial mitral valve regurgitation. No evidence of mitral stenosis.  4. The aortic valve is tricuspid. Aortic valve regurgitation is not visualized. No aortic stenosis is present.  5. The inferior vena cava is normal in size with greater than 50% respiratory variability, suggesting right atrial pressure of 3 mmHg.  6. Agitated saline contrast bubble study was negative, with no evidence of any interatrial shunt. FINDINGS  Left Ventricle: Left ventricular ejection fraction, by estimation, is 55 to 60%. The left ventricle has normal function. The left  ventricle has no regional wall motion abnormalities. The left ventricular internal cavity size was normal in size. There is  mild left ventricular hypertrophy. Left ventricular diastolic parameters are consistent with age-related delayed relaxation (normal). Right Ventricle: The right ventricular size is normal. No increase in right ventricular wall thickness. Right ventricular systolic function is normal. There is moderately elevated pulmonary artery systolic pressure. The tricuspid regurgitant velocity is 3.30 m/s, and with an assumed right atrial pressure of 3 mmHg, the estimated right ventricular systolic pressure is 0000000 mmHg. Left Atrium: Left atrial size was normal in size. Right Atrium: Right atrial size  was normal in size. Pericardium: There is no evidence of pericardial effusion. Mitral Valve: The mitral valve is grossly normal. There is mild thickening of the mitral valve leaflet(s). Trivial mitral valve regurgitation. No evidence of mitral valve stenosis. Tricuspid Valve: The tricuspid valve is normal in structure. Tricuspid valve regurgitation is mild. Aortic Valve: The aortic valve is tricuspid. Aortic valve regurgitation is not visualized. No aortic stenosis is present. Aortic valve mean gradient measures 2.5 mmHg. Aortic valve peak gradient measures 4.0 mmHg. Aortic valve area, by VTI measures 3.03 cm. Pulmonic Valve: The pulmonic valve was not well visualized. Pulmonic valve regurgitation is not visualized. No evidence of pulmonic stenosis. Aorta: The aortic root is normal in size and structure. Pulmonary Artery: The pulmonary artery is not well seen. Venous: The inferior vena cava is normal in size with greater than 50% respiratory variability, suggesting right atrial pressure of 3 mmHg. IAS/Shunts: No atrial level shunt detected by color flow Doppler. Agitated saline contrast was given intravenously to evaluate for intracardiac shunting. Agitated saline contrast bubble study was negative, with no  evidence of any interatrial shunt.  LEFT VENTRICLE PLAX 2D LVIDd:         3.52 cm  Diastology LVIDs:         2.33 cm  LV e' lateral:   10.80 cm/s LV PW:         1.08 cm  LV E/e' lateral: 7.2 LV IVS:        0.78 cm  LV e' medial:    7.07 cm/s LVOT diam:     2.10 cm  LV E/e' medial:  11.0 LV SV:         51 LV SV Index:   36 LVOT Area:     3.46 cm  RIGHT VENTRICLE RV Basal diam:  2.80 cm RV S prime:     12.10 cm/s TAPSE (M-mode): 3.3 cm LEFT ATRIUM             Index       RIGHT ATRIUM           Index LA diam:        2.30 cm 1.63 cm/m  RA Area:     12.80 cm LA Vol (A2C):   32.2 ml 22.75 ml/m RA Volume:   27.30 ml  19.29 ml/m LA Vol (A4C):   38.7 ml 27.34 ml/m LA Biplane Vol: 38.1 ml 26.92 ml/m  AORTIC VALVE                   PULMONIC VALVE AV Area (Vmax):    2.82 cm    PV Vmax:        0.64 m/s AV Area (Vmean):   2.79 cm    PV Peak grad:   1.6 mmHg AV Area (VTI):     3.03 cm    RVOT Peak grad: 1 mmHg AV Vmax:           100.35 cm/s AV Vmean:          69.650 cm/s AV VTI:            0.169 m AV Peak Grad:      4.0 mmHg AV Mean Grad:      2.5 mmHg LVOT Vmax:         81.60 cm/s LVOT Vmean:        56.200 cm/s LVOT VTI:          0.148 m LVOT/AV VTI ratio: 0.88  AORTA Ao Root  diam: 3.00 cm MITRAL VALVE                TRICUSPID VALVE MV Area (PHT): 2.82 cm     TR Peak grad:   43.6 mmHg MV Decel Time: 269 msec     TR Vmax:        330.00 cm/s MV E velocity: 78.00 cm/s MV A velocity: 107.00 cm/s  SHUNTS MV E/A ratio:  0.73         Systemic VTI:  0.15 m                             Systemic Diam: 2.10 cm Nelva Bush MD Electronically signed by Nelva Bush MD Signature Date/Time: 07/14/2019/6:07:14 PM    Final     Medications:  I have reviewed the patient's current medications. Scheduled: . aspirin  81 mg Oral Daily  . atorvastatin  40 mg Oral Daily  . benazepril  20 mg Oral Daily  . feeding supplement (GLUCERNA SHAKE)  237 mL Oral TID BM  . gabapentin  1,200 mg Oral QHS  . insulin aspart  0-5 Units  Subcutaneous QHS  . insulin aspart  0-9 Units Subcutaneous TID WC  . insulin aspart protamine- aspart  22 Units Subcutaneous BID WC  . meclizine  6.25 mg Oral TID  . multivitamin with minerals  1 tablet Oral Daily    Assessment/Plan: 59 y.o. 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 Found to be in DKA.  Noted to have evidence of infarcts on head CT.  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  No evidence of herniation or hydrocephalus.  Mass effect is noted as well as some petechial hemorrhage.  Etiology likely small vessel disease due to poorly controlled risk factors.  Patient on no antiplatelet therapy prior to admission.  BP elevated.  Carotid dopplers show no evidence of hemodynamically significant stenosis.  Echocardiogram pending.  LDL 153.A1c 13.5.   - headache improved - on ASA - CTH with increased edema which has likely peaked given that symptoms started last Friday - on [facility] 4th ventricle is open and given then time frame she should not have any swelling progression - needs continued PT

## 2019-07-16 DIAGNOSIS — I63442 Cerebral infarction due to embolism of left cerebellar artery: Principal | ICD-10-CM

## 2019-07-16 LAB — BASIC METABOLIC PANEL
Anion gap: 11 (ref 5–15)
BUN: 9 mg/dL (ref 6–20)
CO2: 32 mmol/L (ref 22–32)
Calcium: 9.1 mg/dL (ref 8.9–10.3)
Chloride: 95 mmol/L — ABNORMAL LOW (ref 98–111)
Creatinine, Ser: 0.57 mg/dL (ref 0.44–1.00)
GFR calc Af Amer: >60 mL/min (ref >60)
GFR calc non Af Amer: >60 mL/min (ref >60)
Glucose, Bld: 104 mg/dL — ABNORMAL HIGH (ref 70–99)
Potassium: 3.9 mmol/L (ref 3.5–5.1)
Sodium: 138 mmol/L (ref 135–145)

## 2019-07-16 LAB — CBC WITH DIFFERENTIAL/PLATELET
Abs Immature Granulocytes: 0.03 10*3/uL (ref 0.00–0.07)
Basophils Absolute: 0 10*3/uL (ref 0.0–0.1)
Basophils Relative: 0 %
Eosinophils Absolute: 0.1 10*3/uL (ref 0.0–0.5)
Eosinophils Relative: 1 %
HCT: 43 % (ref 36.0–46.0)
Hemoglobin: 14.7 g/dL (ref 12.0–15.0)
Immature Granulocytes: 0 %
Lymphocytes Relative: 42 %
Lymphs Abs: 4.2 10*3/uL — ABNORMAL HIGH (ref 0.7–4.0)
MCH: 32.2 pg (ref 26.0–34.0)
MCHC: 34.2 g/dL (ref 30.0–36.0)
MCV: 94.1 fL (ref 80.0–100.0)
Monocytes Absolute: 0.7 10*3/uL (ref 0.1–1.0)
Monocytes Relative: 7 %
Neutro Abs: 5 10*3/uL (ref 1.7–7.7)
Neutrophils Relative %: 50 %
Platelets: 373 10*3/uL (ref 150–400)
RBC: 4.57 MIL/uL (ref 3.87–5.11)
RDW: 12.5 % (ref 11.5–15.5)
WBC: 10.1 10*3/uL (ref 4.0–10.5)
nRBC: 0 % (ref 0.0–0.2)

## 2019-07-16 LAB — MAGNESIUM: Magnesium: 1.7 mg/dL (ref 1.7–2.4)

## 2019-07-16 LAB — GLUCOSE, CAPILLARY
Glucose-Capillary: 184 mg/dL — ABNORMAL HIGH (ref 70–99)
Glucose-Capillary: 215 mg/dL — ABNORMAL HIGH (ref 70–99)
Glucose-Capillary: 137 mg/dL — ABNORMAL HIGH (ref 70–99)
Glucose-Capillary: 240 mg/dL — ABNORMAL HIGH (ref 70–99)

## 2019-07-16 MED ORDER — BUPROPION HCL ER (XL) 150 MG PO TB24
300.0000 mg | ORAL_TABLET | Freq: Every day | ORAL | Status: DC
  Administered 2019-07-17 – 2019-07-20 (×4): 300 mg via ORAL
  Filled 2019-07-16 (×4): qty 2

## 2019-07-16 MED ORDER — HYDROCODONE-ACETAMINOPHEN 5-325 MG PO TABS
1.0000 | ORAL_TABLET | ORAL | Status: DC | PRN
  Administered 2019-07-16: 2 via ORAL
  Administered 2019-07-18: 09:00:00 1 via ORAL
  Administered 2019-07-20: 2 via ORAL
  Filled 2019-07-16: qty 1
  Filled 2019-07-16 (×2): qty 2

## 2019-07-16 MED ORDER — MAGNESIUM SULFATE 2 GM/50ML IV SOLN
2.0000 g | Freq: Once | INTRAVENOUS | Status: AC
  Administered 2019-07-16: 2 g via INTRAVENOUS
  Filled 2019-07-16: qty 50

## 2019-07-16 NOTE — Care Management Important Message (Signed)
Important Message  Patient Details  Name: Brittany [name] MRN: [id number] Date of Birth: 03-29-1960   Medicare Important Message Given:  Yes     [name] 07/16/2019, 1:11 PM

## 2019-07-16 NOTE — Progress Notes (Signed)
Occupational Therapy Treatment Patient Details Name: Brittany [name] MRN: [id number] DOB: Jan 25, 1961 Today's Date: 07/16/2019    History of present illness Pt admitted for DKA with + CVA in L cerebellum along with petchial hemorrhage. Initial complaints includes HA, fall, and L arm numbness (approx 2 months). PMH includes partail pancreatectomy, Lupus, DM, and HTN. New CT revealed 51mm cerebellar shift to R of midling. CLeared to participate via neuro   OT comments  Brittany [name] continues to be limited by severe headache pain, as well as generalized weakness and impaired balance in her ability to safely and independently complete functional tasks.  OTR and PT provided mod assist for pt to complete supine > sit transfer.  Pt reports increased headache pain with movement, but was agreeable to transfer practice after a few minutes of sitting EOB.  OTR prompted pt to practice deep breathing to manage pain and anxiety with mobility.  OTR and PT provided mod assist x2 for sit to stand transfer with RW.  Pt was emotional with intermittent crying with transfer.  Pt required mod assist to maintain static standing balance, with improvement after cueing for upright posture and extended time.  Pt able to stand for ~3 minutes with physical assist from therapy and BUE support on RW.  Pt reported feeling dizzy, so therapist safely assisted pt to seated position EOB.  OTR provided setup assist for pt to brush teeth while seated EOB.  Pt appeared to pause task, and reported feeling like she was blacking out when prompted.  OTR and PT provided mod assist for to return to supine position.  Pt's blood pressure was stable throughout session, only slightly raising with activity.  Brittany [name] will continue to benefit from skilled OT services in acute setting to address functional strengthening, balance, endurance, and safety and independence in ADLs.  Changing recommendation to SNF after discharge as pt is heavily dependent on  assistance for self care tasks at this time.   Follow Up Recommendations  SNF;Supervision/Assistance - 24 hour    Equipment Recommendations  3 in 1 bedside commode;Tub/shower seat    Recommendations for Other Services      Precautions / Restrictions Precautions Precautions: Fall Restrictions Weight Bearing Restrictions: No       Mobility Bed Mobility Overal bed mobility: Needs Assistance Bed Mobility: Supine to Sit;Sit to Supine;Rolling Rolling: Min assist   Supine to sit: Mod assist Sit to supine: Mod assist   General bed mobility comments: needs assistance for trunk support, increased assistance given in sit > supine 2/2 pt reports of "blacking out"  Transfers Overall transfer level: Needs assistance Equipment used: Rolling walker (2 wheeled) Transfers: Sit to/from Stand Sit to Stand: Mod assist;+2 safety/equipment         General transfer comment: needs cues for hand placement prior to transfer. Once standing, unsteady and needs mod assist to maintain balance, progressing to min assist with upright posture and extended time.    Balance Overall balance assessment: Needs assistance;History of Falls Sitting-balance support: Single extremity supported;Feet supported Sitting balance-Leahy Scale: Poor Sitting balance - Comments: requires at least 1 UE support on bed or bed rail   Standing balance support: Bilateral upper extremity supported Standing balance-Leahy Scale: Poor Standing balance comment: needs B hands on RW for support                           ADL either performed or assessed with clinical judgement   ADL Overall ADL's :  Needs assistance/impaired                                       General ADL Comments: Pt requires setup-min A for seated upper body ADLs including grooming and feeding.  Pt requires mod-max assist for lower body dressing, bathing, toileting, and functional mobility.  OTR provided setup assist for pt to  brush teeth seated EOB while PT provided min A for seated balance.     Vision       Perception     Praxis      Cognition Arousal/Alertness: Awake/alert Behavior During Therapy: WFL for tasks assessed/performed Overall Cognitive Status: Within Functional Limits for tasks assessed                                 General Comments: Pt emotional and anxious with mobility        Exercises Other Exercises Other Exercises: provided education re: self care, importance of OOB mobility for functional strengthening, endurance, and balance, safety and sequencing in sit to stand transfers with RW   Shoulder Instructions       General Comments BP = 130/86 at rest, 133/90 after activity and returning to bedlevel    Pertinent Vitals/ Pain       Pain Assessment: Faces Faces Pain Scale: Hurts worst Pain Location: headache, pt unable to rate but says it is the worst she has ever felt.  Headache worsens with movement. Pain Descriptors / Indicators: Constant;Discomfort;Guarding;Grimacing;Crying;Moaning Pain Intervention(s): Limited activity within patient's tolerance;Monitored during session;Utilized relaxation techniques;Repositioned  Home Living                                          Prior Functioning/Environment              Frequency  Min 2X/week        Progress Toward Goals  OT Goals(current goals can now be found in the care plan section)  Progress towards OT goals: Progressing toward goals  Acute Rehab OT Goals Patient Stated Goal: feel better OT Goal Formulation: With patient Time For Goal Achievement: 07/28/19 Potential to Achieve Goals: Good  Plan Discharge plan remains appropriate;Frequency remains appropriate    Co-evaluation    PT/OT/SLP Co-Evaluation/Treatment: Yes Reason for Co-Treatment: To address functional/ADL transfers PT goals addressed during session: Mobility/safety with mobility OT goals addressed during  session: ADL's and self-care      AM-PAC OT "6 Clicks" Daily Activity     Outcome Measure   Help from another person eating meals?: A Little Help from another person taking care of personal grooming?: A Little Help from another person toileting, which includes using toliet, bedpan, or urinal?: A Lot Help from another person bathing (including washing, rinsing, drying)?: A Lot Help from another person to put on and taking off regular upper body clothing?: A Little Help from another person to put on and taking off regular lower body clothing?: A Lot 6 Click Score: 15    End of Session Equipment Utilized During Treatment: Gait belt;Rolling walker  OT Visit Diagnosis: Other abnormalities of gait and mobility (R26.89);Muscle weakness (generalized) (M62.81);Pain Pain - part of body: (headache)   Activity Tolerance Patient limited by pain   Patient Left in bed;with call bell/phone  within reach;with bed alarm set   Nurse Communication          Time: [id number] OT Time Calculation (min): 26 min  Charges: OT General Charges $OT Visit: 1 Visit OT Treatments $Self Care/Home Management : 8-22 mins  Myrtie Hawk Dave Mannes, OTR/L 07/16/19, 2:08 PM

## 2019-07-16 NOTE — Progress Notes (Addendum)
PT Cancellation Note  Patient Details Name: Brittany [name] MRN: [id number] DOB: Mar 16, 1960   Cancelled Treatment:    Reason Eval/Treat Not Completed: Medical issues which prohibited therapy   Pt with headache.  Holding head.  BP taken 168/113.  Session held this am and RN messaged.  Pt stated BP has been high all am.  She did request I return later this am to see if she is able to participate later.  Will accommodate as schedule allows.  Addendum.  Discussed with RN.  Feels dynamap is more accurate with BP,  Will check with that vs V2 monitor in room for PM attempt.   Chesley Noon 07/16/2019, 11:07 AM

## 2019-07-16 NOTE — Progress Notes (Signed)
OT Cancellation Note  Patient Details Name: Brittany [name] MRN: [id number] DOB: Jul 02, 1960   Cancelled Treatment:    Reason Eval/Treat Not Completed: Medical issues which prohibited therapy   Attempted to engage pt in OT tx, but pt was just waking up and requested OTR return at later time.  Per PT report, pt soon after had blood pressure of 168/113 with severe headache resulting in hold for therapy until blood pressure lowers.  Will continue to follow up at next opportunity.  Myrtie Hawk Onaje Warne, OTR/L 07/16/19, 11:19 AM

## 2019-07-16 NOTE — Progress Notes (Signed)
Subjective: Still N/V. Didn't sleep last night. HA on R side persistent.   Objective: Current vital signs: BP 131/90 (BP Location: Left Arm)   Pulse 84   Temp 97.9 F (36.6 C)   Resp 16   Ht 5' (1.524 m)   Wt 47.3 kg   SpO2 100%   BMI 20.37 kg/m  Vital signs in last 24 hours: Temp:  [97.5 F (36.4 C)-97.9 F (36.6 C)] 97.9 F (36.6 C) (06/03 0825) Pulse Rate:  [84] 84 (06/02 2310) Resp:  [14-16] 16 (06/03 0825) BP: (131)/(90) 131/90 (06/02 2310) SpO2:  [100 %] 100 % (06/02 2310)  Intake/Output from previous day: 06/02 0701 - 06/03 0700 In: 240 [P.O.:240] Out: -  Intake/Output this shift: No intake/output data recorded. Nutritional status:  Diet Order            Diet Carb Modified Fluid consistency: Thin; Room service appropriate? Yes  Diet effective now              Neurologic Exam: Mental Status: Alert, oriented, thought content appropriate.  Speech fluent without evidence of aphasia.  Able to follow 3 step commands without difficulty. Cranial Nerves: II: Visual fields grossly normal, pupils equal, round, reactive to light and accommodation III,IV, VI: ptosis not present, extra-ocular motions intact bilaterally V,VII: mild decrease in right NLF, facial light touch sensation normal bilaterally VIII: hearing normal bilaterally IX,X: gag reflex present XI: bilateral shoulder shrug XII: midline tongue extension Motor: Right : Upper extremity   5/5    Left:     Upper extremity   5/5  Lower extremity   5/5     Lower extremity   5/5 Tone and bulk:normal tone throughout; no atrophy noted Sensory: Pinprick and light touch decreased in the fingertips of the LUE    Lab Results: Basic Metabolic Panel: Recent Labs  Lab 07/12/19 1558 07/12/19 1558 07/12/19 2024 07/12/19 2024 07/13/19 0517 07/14/19 0632 07/16/19 0416  NA 131*  --  133*  --  133* 132* 138  K 3.8  --  2.9*  --  4.9 3.8 3.9  CL 90*  --  100  --  101 96* 95*  CO2 16*  --  24  --  23 26 32   GLUCOSE 406*  --  237*  --  231* 308* 104*  BUN 19  --  18  --  17 6 9   CREATININE 0.90  --  0.61  --  0.67 0.47 0.57  CALCIUM 9.3   < > 7.8*   < > 8.3* 8.4* 9.1  MG  --   --   --   --   --   --  1.7   < > = values in this interval not displayed.    Liver Function Tests: Recent Labs  Lab 07/12/19 1558  AST 20  ALT 16  ALKPHOS 76  BILITOT 2.1*  PROT 8.2*  ALBUMIN 3.8   Recent Labs  Lab 07/12/19 1558  LIPASE 49   No results for input(s): AMMONIA in the last 168 hours.  CBC: Recent Labs  Lab 07/12/19 1558 07/13/19 0517 07/16/19 0416  WBC 5.4 5.4 10.1  NEUTROABS  --   --  5.0  HGB 17.1* 13.9 14.7  HCT 49.6* 40.5 43.0  MCV 93.8 93.8 94.1  PLT 413* 401* 373    Cardiac Enzymes: No results for input(s): CKTOTAL, CKMB, CKMBINDEX, TROPONINI in the last 168 hours.  Lipid Panel: Recent Labs  Lab 07/14/19 0632  CHOL 200  TRIG 72  HDL 33*  CHOLHDL 6.1  VLDL 14  LDLCALC 153*    CBG: Recent Labs  Lab 07/15/19 0737 07/15/19 1212 07/15/19 1707 07/15/19 2056 07/16/19 0822  GLUCAP 225* 250* 369* 149* 184*    Microbiology: Results for orders placed or performed during the hospital encounter of 07/12/19  SARS Coronavirus 2 by RT PCR (hospital order, performed in [facility] hospital lab) Nasopharyngeal Nasopharyngeal Swab     Status: None   Collection Time: 07/13/19  7:20 AM   Specimen: Nasopharyngeal Swab  Result Value Ref Range Status   SARS Coronavirus 2 NEGATIVE NEGATIVE Final    Comment: (NOTE) SARS-CoV-2 target nucleic acids are NOT DETECTED. The SARS-CoV-2 RNA is generally detectable in upper and lower respiratory specimens during the acute phase of infection. The lowest concentration of SARS-CoV-2 viral copies this assay can detect is 250 copies / mL. A negative result does not preclude SARS-CoV-2 infection and should not be used as the sole basis for treatment or other patient management decisions.  A negative result may occur with improper  specimen collection / handling, submission of specimen other than nasopharyngeal swab, presence of viral mutation(s) within the areas targeted by this assay, and inadequate number of viral copies (<250 copies / mL). A negative result must be combined with clinical observations, patient history, and epidemiological information. Fact Sheet for Patients:   StrictlyIdeas.no Fact Sheet for Healthcare Providers: BankingDealers.co.za This test is not yet approved or cleared  by the [place] FDA and has been authorized for detection and/or diagnosis of SARS-CoV-2 by FDA under an Emergency Use Authorization (EUA).  This EUA will remain in effect (meaning this test can be used) for the duration of the COVID-19 declaration under Section 564(b)(1) of the Act, 21 U.S.C. section 360bbb-3(b)(1), unless the authorization is terminated or revoked sooner. Performed at [facility], [address] [place], [place] [address]     Coagulation Studies: No results for input(s): LABPROT, INR in the last 72 hours.  Imaging: CT HEAD WO CONTRAST  Result Date: 07/14/2019 CLINICAL DATA:  59 year old female with headache. Follow-up stroke. EXAM: CT HEAD WITHOUT CONTRAST TECHNIQUE: Contiguous axial images were obtained from the base of the skull through the vertex without intravenous contrast. COMPARISON:  Head CT dated 07/13/2019. FINDINGS: Brain: Large area of edema involving the left cerebellum related to recent infarct. Overall increased in the amount of edema with associated mass effect and approximately 12 mm left right cerebellar shift. There is also mass effect on the fourth ventricle. No evidence of transtentorial herniation or inferior herniation through the foramen magnum. No significant ventricular dilatation. There is otherwise mild diffuse chronic microvascular ischemic changes and an area of old infarct or chronic microvascular ischemic changes  involving the right frontal lobe. There is no acute intracranial hemorrhage. No subfalcine herniation. Vascular: No hyperdense vessel or unexpected calcification. Skull: Normal. Negative for fracture or focal lesion. Sinuses/Orbits: No acute finding. Other: None IMPRESSION: 1. Increased edema in the left cerebellum with approximately 12 mm cerebellar shift to the right of the midline. Continued close follow-up recommended. 2. No acute bleed.  No hydrocephalus. Electronically Signed   By: Anner Crete M.D.   On: 07/14/2019 20:48    Medications:  I have reviewed the patient's current medications. Scheduled: . aspirin  81 mg Oral Daily  . atorvastatin  80 mg Oral Daily  . benazepril  20 mg Oral Daily  . enoxaparin (LOVENOX) injection  40 mg Subcutaneous Q24H  . feeding supplement (  GLUCERNA SHAKE)  237 mL Oral TID BM  . gabapentin  1,200 mg Oral QHS  . insulin aspart  0-5 Units Subcutaneous QHS  . insulin aspart  0-9 Units Subcutaneous TID WC  . insulin aspart protamine- aspart  22 Units Subcutaneous BID WC  . meclizine  6.25 mg Oral TID  . multivitamin with minerals  1 tablet Oral Daily    Assessment/Plan: 59 y.o. 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 Found to be in DKA.  Noted to have evidence of infarcts on head CT.  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  No evidence of herniation or hydrocephalus.  Mass effect is noted as well as some petechial hemorrhage.  Etiology likely small vessel disease due to poorly controlled  risk factors.  Patient on no antiplatelet therapy prior to admission.  BP elevated.  Carotid dopplers show no evidence of hemodynamically significant stenosis.  Echocardiogram pending.  LDL 153.A1c 13.5.   - headache persistent- will give 2g Mg sulfate - on ASA - CTH with increased edema which has likely peaked given that symptoms started last Friday - on [facility] 4th ventricle is open and given then time frame she should not have any swelling progression - needs continued PT - d/c planning. Appreciate PT and likely CIR status

## 2019-07-16 NOTE — Progress Notes (Addendum)
PROGRESS NOTE    Brittany [name]  SWF:[id number] DOB: [birth year] DOA: 07/12/2019 PCP: [facility], [facility]   Chief complaint.  Headache and nausea vomiting. 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   She was treated in the emergency room with the insulin and IV fluids, her anion gap was normalized and she was admitted to the hospital for further treatment.  Also had a CT head showed a left cerebellar stroke.  Which was confirmed by MRI.  Carotid ultrasound did not show significant occlusion.  Patient has been followed by neurology.  6/2.  Patient had a worsening nausea vomiting.  Repeated CT scan showed left cerebellar stroke with worsening edema and mild midline shift.  Has been seen by neurology, deterioration from this point  is unlikely.  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 Glucose much better after increased dose of NovoLog. Continue to follow.  2.  Acute left cerebellar stroke. Condition seems better today.  He still has some headache, but no nausea vomiting.  Continue to follow.  Appreciate neurology consult.  3.  Nausea vomiting. Condition improving.  4.  Essential  hypertension. Continue to follow.  5.  Discoid lupus erythematous. Continue Plaquenil.  6.  Hypokalemia. Resolved.    DVT prophylaxis: Lovenox Code Status: Full Family Communication: None Disposition Plan:   Patient came from: Home                                                                                                                           Anticipated d/c place: Home  Barriers to d/c OR conditions which need to be met to effect a safe d/c:   Consultants:   Neurology  Procedures: None Antimicrobials: None   Subjective: Patient is complaining some right side headache, loss of hearing.  Nausea vomiting essentially resolved.  She had a very good appetite this morning.  No abdominal pain. She denies any short of breath or cough.   No abdominal pain or diarrhea. No fever or chills.   Objective: Vitals:   07/14/19 2315 07/15/19 0738 07/15/19 2310 07/16/19 0825  BP: 122/84 (!) 149/90 131/90   Pulse: 74  84   Resp: _0 Temp: 98.2 F (36.8 C) 98.1 F (36.7 C) (!) 97.5 F (36.4 C) 97.9 F (36.6 C)  TempSrc: Oral Oral Oral   SpO2: 100% 100% 100%   Weight:      Height:        Intake/Output Summary (Last 24 hours) at 07/16/2019 0905 Last data filed at 07/15/2019 0915 Gross per 24 hour  Intake 240 ml  Output --  Net 240 ml   Filed Weights   07/12/19 1506 07/13/19 1125  Weight: 45.4 kg 47.3 kg    Examination:  General exam: Appears calm and comfortable  Respiratory system: Clear to auscultation. Respiratory effort normal. Cardiovascular system: S1 & S2 heard, RRR. No JVD, murmurs, rubs, gallops or clicks. No pedal edema. Gastrointestinal system: Abdomen is nondistended, soft and nontender. No organomegaly or masses felt. Normal bowel sounds heard. Central nervous system: Alert and oriented. No focal neurological deficits. Extremities: Symmetric 5 x 5 power. Skin: No rashes, lesions or ulcers Psychiatry: Judgement and insight appear  normal. Mood & affect appropriate.     Data Reviewed: I have personally reviewed following labs and imaging studies  CBC: Recent Labs  Lab 07/12/19 1558 07/13/19 0517 07/16/19 0416  WBC 5.4 5.4 10.1  NEUTROABS  --   --  5.0  HGB 17.1* 13.9 14.7  HCT 49.6* 40.5 43.0  MCV 93.8 93.8 94.1  PLT 413* 401* 224   Basic Metabolic Panel: Recent Labs  Lab 07/12/19 1558 07/12/19 2024 07/13/19 0517 07/14/19 0632 07/16/19 0416  NA 131* 133* 133* 132* 138  K 3.8 2.9* 4.9 3.8 3.9  CL 90* 100 101 96* 95*  CO2 16* _0 32  GLUCOSE 406* 237* 231* 308* 104*  BUN _1 CREATININE 0.90 0.61 0.67 0.47 0.57  CALCIUM 9.3 7.8* 8.3* 8.4* 9.1  MG  --   --   --   --  1.7   GFR: Estimated Creatinine Clearance: 54.4 mL/min (by C-G formula based on SCr of 0.57 mg/dL). Liver Function Tests: Recent Labs  Lab 07/12/19 1558  AST 20  ALT 16  ALKPHOS 76  BILITOT 2.1*  PROT 8.2*  ALBUMIN 3.8   Recent Labs  Lab 07/12/19 1558  LIPASE 49   No results for input(s): AMMONIA in the last 168 hours. Coagulation Profile: No results for input(s): INR, PROTIME in the last 168 hours. Cardiac Enzymes: No results for input(s): CKTOTAL, CKMB, CKMBINDEX, TROPONINI in the last 168 hours. BNP (last 3 results) No results for input(s): PROBNP in the last 8760 hours. HbA1C: No results for input(s): HGBA1C in the last 72 hours. CBG: Recent Labs  Lab 07/15/19 0737 07/15/19 1212 07/15/19 1707 07/15/19 2056 07/16/19 0822  GLUCAP 225* 250* 369* 149* 184*   Lipid Profile: Recent Labs    07/14/19 0632  CHOL 200  HDL 33*  LDLCALC 153*  TRIG 72  CHOLHDL 6.1   Thyroid Function Tests: No results for input(s): TSH, T4TOTAL, FREET4, T3FREE, THYROIDAB in the last 72 hours. Anemia Panel: No results for input(s): VITAMINB12, FOLATE, FERRITIN, TIBC, IRON, RETICCTPCT in the last 72 hours. Sepsis Labs: No results for input(s): PROCALCITON, LATICACIDVEN in the last 168 hours.  Recent Results  (from the past 240 hour(s))  SARS Coronavirus 2 by RT PCR (hospital order, performed in [facility] hospital lab) Nasopharyngeal Nasopharyngeal Swab     Status: None   Collection Time: 07/13/19  7:20 AM   Specimen: Nasopharyngeal Swab  Result Value Ref Range Status   SARS Coronavirus 2 NEGATIVE NEGATIVE Final    Comment: (NOTE) SARS-CoV-2 target nucleic acids are NOT DETECTED. The SARS-CoV-2  RNA is generally detectable in upper and lower respiratory specimens during the acute phase of infection. The lowest concentration of SARS-CoV-2 viral copies this assay can detect is 250 copies / mL. A negative result does not preclude SARS-CoV-2 infection and should not be used as the sole basis for treatment or other patient management decisions.  A negative result may occur with improper specimen collection / handling, submission of specimen other than nasopharyngeal swab, presence of viral mutation(s) within the areas targeted by this assay, and inadequate number of viral copies (<250 copies / mL). A negative result must be combined with clinical observations, patient history, and epidemiological information. Fact Sheet for Patients:   StrictlyIdeas.no Fact Sheet for Healthcare Providers: BankingDealers.co.za This test is not yet approved or cleared  by the [place] FDA and has been authorized for detection and/or diagnosis of SARS-CoV-2 by FDA under an Emergency Use Authorization (EUA).  This EUA will remain in effect (meaning this test can be used) for the duration of the COVID-19 declaration under Section 564(b)(1) of the Act, 21 U.S.C. section 360bbb-3(b)(1), unless the authorization is terminated or revoked sooner. Performed at [facility], [address], [place], [place] [address]          Radiology Studies: CT HEAD WO CONTRAST  Result Date: 07/14/2019 CLINICAL DATA:  59 year old female with headache. Follow-up  stroke. EXAM: CT HEAD WITHOUT CONTRAST TECHNIQUE: Contiguous axial images were obtained from the base of the skull through the vertex without intravenous contrast. COMPARISON:  Head CT dated 07/13/2019. FINDINGS: Brain: Large area of edema involving the left cerebellum related to recent infarct. Overall increased in the amount of edema with associated mass effect and approximately 12 mm left right cerebellar shift. There is also mass effect on the fourth ventricle. No evidence of transtentorial herniation or inferior herniation through the foramen magnum. No significant ventricular dilatation. There is otherwise mild diffuse chronic microvascular ischemic changes and an area of old infarct or chronic microvascular ischemic changes involving the right frontal lobe. There is no acute intracranial hemorrhage. No subfalcine herniation. Vascular: No hyperdense vessel or unexpected calcification. Skull: Normal. Negative for fracture or focal lesion. Sinuses/Orbits: No acute finding. Other: None IMPRESSION: 1. Increased edema in the left cerebellum with approximately 12 mm cerebellar shift to the right of the midline. Continued close follow-up recommended. 2. No acute bleed.  No hydrocephalus. Electronically Signed   By: Anner Crete M.D.   On: 07/14/2019 20:48   ECHOCARDIOGRAM COMPLETE BUBBLE STUDY  Result Date: 07/14/2019    ECHOCARDIOGRAM REPORT   Patient Name:   KIMBLY EANES Date of Exam: 07/14/2019 Medical Rec #:  [id number]     Height:       60.0 in Accession #:    [id number]    Weight:       104.3 lb Date of Birth:  1961-01-08    BSA:          1.415 m Patient Age:    41 years      BP:           160/97 mmHg Patient Gender: F             HR:           76 bpm. Exam Location:  [facility] Procedure: 2D Echo, Color Doppler and Cardiac Doppler Indications:     Stroke 434.91  History:         Patient has no prior history of Echocardiogram examinations.  Risk Factors:Hypertension and Diabetes.  Sonographer:      Sherrie Sport RDCS (AE) Referring Phys:  [id number] [facility] AMERY Diagnosing Phys: Nelva Bush MD IMPRESSIONS  1. Left ventricular ejection fraction, by estimation, is 55 to 60%. The left ventricle has normal function. The left ventricle has no regional wall motion abnormalities. There is mild left ventricular hypertrophy. Left ventricular diastolic parameters are consistent with age-related delayed relaxation (normal).  2. Right ventricular systolic function is normal. The right ventricular size is normal. There is moderately elevated pulmonary artery systolic pressure.  3. The mitral valve is grossly normal. Trivial mitral valve regurgitation. No evidence of mitral stenosis.  4. The aortic valve is tricuspid. Aortic valve regurgitation is not visualized. No aortic stenosis is present.  5. The inferior vena cava is normal in size with greater than 50% respiratory variability, suggesting right atrial pressure of 3 mmHg.  6. Agitated saline contrast bubble study was negative, with no evidence of any interatrial shunt. FINDINGS  Left Ventricle: Left ventricular ejection fraction, by estimation, is 55 to 60%. The left ventricle has normal function. The left ventricle has no regional wall motion abnormalities. The left ventricular internal cavity size was normal in size. There is  mild left ventricular hypertrophy. Left ventricular diastolic parameters are consistent with age-related delayed relaxation (normal). Right Ventricle: The right ventricular size is normal. No increase in right ventricular wall thickness. Right ventricular systolic function is normal. There is moderately elevated pulmonary artery systolic pressure. The tricuspid regurgitant velocity is 3.30 m/s, and with an assumed right atrial pressure of 3 mmHg, the estimated right ventricular systolic pressure is 79.0 mmHg. Left Atrium: Left atrial size was normal in size. Right Atrium: Right atrial size was normal in size. Pericardium: There is no evidence of  pericardial effusion. Mitral Valve: The mitral valve is grossly normal. There is mild thickening of the mitral valve leaflet(s). Trivial mitral valve regurgitation. No evidence of mitral valve stenosis. Tricuspid Valve: The tricuspid valve is normal in structure. Tricuspid valve regurgitation is mild. Aortic Valve: The aortic valve is tricuspid. Aortic valve regurgitation is not visualized. No aortic stenosis is present. Aortic valve mean gradient measures 2.5 mmHg. Aortic valve peak gradient measures 4.0 mmHg. Aortic valve area, by VTI measures 3.03 cm. Pulmonic Valve: The pulmonic valve was not well visualized. Pulmonic valve regurgitation is not visualized. No evidence of pulmonic stenosis. Aorta: The aortic root is normal in size and structure. Pulmonary Artery: The pulmonary artery is not well seen. Venous: The inferior vena cava is normal in size with greater than 50% respiratory variability, suggesting right atrial pressure of 3 mmHg. IAS/Shunts: No atrial level shunt detected by color flow Doppler. Agitated saline contrast was given intravenously to evaluate for intracardiac shunting. Agitated saline contrast bubble study was negative, with no evidence of any interatrial shunt.  LEFT VENTRICLE PLAX 2D LVIDd:         3.52 cm  Diastology LVIDs:         2.33 cm  LV e' lateral:   10.80 cm/s LV PW:         1.08 cm  LV E/e' lateral: 7.2 LV IVS:        0.78 cm  LV e' medial:    7.07 cm/s LVOT diam:     2.10 cm  LV E/e' medial:  11.0 LV SV:         51 LV SV Index:   36 LVOT Area:     3.46 cm  RIGHT VENTRICLE RV  Basal diam:  2.80 cm RV S prime:     12.10 cm/s TAPSE (M-mode): 3.3 cm LEFT ATRIUM             Index       RIGHT ATRIUM           Index LA diam:        2.30 cm 1.63 cm/m  RA Area:     12.80 cm LA Vol (A2C):   32.2 ml 22.75 ml/m RA Volume:   27.30 ml  19.29 ml/m LA Vol (A4C):   38.7 ml 27.34 ml/m LA Biplane Vol: 38.1 ml 26.92 ml/m  AORTIC VALVE                   PULMONIC VALVE AV Area (Vmax):    2.82  cm    PV Vmax:        0.64 m/s AV Area (Vmean):   2.79 cm    PV Peak grad:   1.6 mmHg AV Area (VTI):     3.03 cm    RVOT Peak grad: 1 mmHg AV Vmax:           100.35 cm/s AV Vmean:          69.650 cm/s AV VTI:            0.169 m AV Peak Grad:      4.0 mmHg AV Mean Grad:      2.5 mmHg LVOT Vmax:         81.60 cm/s LVOT Vmean:        56.200 cm/s LVOT VTI:          0.148 m LVOT/AV VTI ratio: 0.88  AORTA Ao Root diam: 3.00 cm MITRAL VALVE                TRICUSPID VALVE MV Area (PHT): 2.82 cm     TR Peak grad:   43.6 mmHg MV Decel Time: 269 msec     TR Vmax:        330.00 cm/s MV E velocity: 78.00 cm/s MV A velocity: 107.00 cm/s  SHUNTS MV E/A ratio:  0.73         Systemic VTI:  0.15 m                             Systemic Diam: 2.10 cm Nelva Bush MD Electronically signed by Nelva Bush MD Signature Date/Time: 07/14/2019/6:07:14 PM    Final         Scheduled Meds: . aspirin  81 mg Oral Daily  . atorvastatin  80 mg Oral Daily  . benazepril  20 mg Oral Daily  . enoxaparin (LOVENOX) injection  40 mg Subcutaneous Q24H  . feeding supplement (GLUCERNA SHAKE)  237 mL Oral TID BM  . gabapentin  1,200 mg Oral QHS  . insulin aspart  0-5 Units Subcutaneous QHS  . insulin aspart  0-9 Units Subcutaneous TID WC  . insulin aspart protamine- aspart  22 Units Subcutaneous BID WC  . meclizine  6.25 mg Oral TID  . multivitamin with minerals  1 tablet Oral Daily   Continuous Infusions:   LOS: 3 days    Time spent: 25 mins    Sharen Hones, MD [facility]   To contact the attending provider between 7A-7P or the covering provider during after hours 7P-7A, please log into the web site [email or web address] and access using universal [place] password for that  web site. If you do not have the password, please call the hospital operator.  07/16/2019, 9:05 AM

## 2019-07-16 NOTE — TOC Progression Note (Signed)
Transition of Care [facility]) - Progression Note    Patient Details  Name: Brittany [name] MRN: [id number] Date of Birth: 03/12/1960  Transition of Care [facility]) CM/SW Contact  Shelbie Hutching, RN Phone Number: 07/16/2019, 3:13 PM  Clinical Narrative:    Patient declines CIR and sense PT and OT do not believe she is appropriate to go home they now recommend SNF.  Patient reports that she doesn't want to do this either but she has no one at home to care for her 24/7.  Patient states she will speak with her daughter before making a decision.  This RNCM will follow up with patient tomorrow morning.    Expected Discharge Plan: [place] Barriers to Discharge: Continued Medical Work up  Expected Discharge Plan and Services Expected Discharge Plan: [place]   Discharge Planning Services: CM Consult Post Acute Care Choice: [place] arrangements for the past 2 months: [place]: PT, RN, OT [facility] [place] Agency: [place] ([place]) Date HH Agency Contacted: 07/14/19 Time [place]: 1200 Representative spoke with at [place]: [place] (SDOH) Interventions    Readmission Risk Interventions No flowsheet data found.

## 2019-07-16 NOTE — Progress Notes (Addendum)
Inpatient Rehabilitation Admissions Coordinator  Inpatient rehab consult received. I contacted patient by phone to discuss her rehab venue options. Noted initial recommendations from therapy were for home health, and then due to decline in function, changed to CIR. I discussed a possible inpt rehab admit at [facility] in [place] when patient felt medically ready for discharge. Patient declines admit for states she has bills and issues to take care of at home and prefers direct discharge home with [facility] as planned. Her 8 year old daughter lives with patient and can provide assist at home. Noted headache and BP issues limiting her progressing with mobility at this time. Patient agreeable for me to follow up with her progress tomorrow. I will update acute and TOC team.Please call me with any questions.  Danne Baxter, RN, MSN Rehab Admissions Coordinator [phone] 07/16/2019 11:38 AM

## 2019-07-16 NOTE — Progress Notes (Signed)
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 Initial complaints includes HA, fall, and L arm numbness (approx 2 months). PMH includes partail pancreatectomy, Lupus, DM, and HTN. New CT revealed 50mm cerebellar shift to R of midling. CLeared to participate via neuro    PT Comments    Co-tx with OT for improved outcomes.  1 unit billed each discipline per policy.  BP taken prior to session with dynamap130/86 P 88.  Pt continues to c/o significant headache.  She is able to get to EOB with mod a x 1 with bed flat to simulate home as she is refusing CIR and planning to return home upon discharge.  She struggles and is unable to do without assist.  Once sitting she requires min a x 1 at all times for general safety and balance.  Headache continues throughout session but she agrees to try to stand.  Stood to [employer] with min/mod a x 2 with vc's for hand placements.  Once standing she continues to need hands on assist to take 3 sidesteps up in bed for better positioning.  She sits due to fatigue and headache but remains sitting for long discussion regarding safe discharge plan and options along with brushing her teeth.  Upon finishing her teeth she c/o feeling as if she would black out.  Returned to supine with mod a x 1 for upper and lower body control  BP 133/97 P 88.    Long discussion regarding discharge plan.  She has refused CIR and initially stated she is going home but is aware she is not strong enough to go home.  Daughter is available to help some but is not home 24 hours a day and has a 60 year old to care for.  She cries through most of session.  She does voice that finances are a concern for her and her main reason for refusing CIR.  Encouraged her to have CIR run finances and find out cost or if she qualifies for a  charity admission.  She initially agrees but then stated she cannot go to [place].  She is open to SNF in [place] but again voices financial concerns.  Discussed benefits of CIR vs SNF and level of therapy at both places and given Covid restrictions visitation is limited at both places.  She remains firm in no CIR but she knows she cannot go home as she is.  Stated MD talked possible discharge tomorrow.  Discussed with SWS regarding rehab and her concerns.  Pt stated again she did not want to find out how much CIR would cost at end of session.  Discussed with primary PT.   Follow Up Recommendations  SNF     Equipment Recommendations  Rolling walker with 5" wheels;Wheelchair (measurements PT);3in1 (PT)    Recommendations for Other Services       Precautions / Restrictions Precautions Precautions: Fall Restrictions Weight Bearing Restrictions: No    Mobility  Bed Mobility Overal bed mobility: Needs Assistance Bed Mobility: Supine to Sit;Sit to Supine;Rolling Rolling: Min assist   Supine to sit: Mod assist Sit to supine: Mod assist   General bed mobility comments: needs assistance for trunk support, increased assistance given in sit > supine 2/2 pt reports of feeling as she was  "blacking out" at end of session.  BP 133/97 P 88  once in supine  Transfers Overall transfer level: Needs assistance Equipment used: Rolling walker (2 wheeled) Transfers: Sit to/from Stand Sit to Stand: Min assist;Mod assist;+2 physical assistance         General transfer comment: needs cues for hand placement prior to transfer. Once standing, unsteady and needs mod assist to maintain balance, progressing to min assist with upright posture and extended time.  Ambulation/Gait Ambulation/Gait assistance: Min assist;+2 physical assistance Gait Distance (Feet): 3 Feet Assistive device: Rolling walker (2 wheeled) Gait Pattern/deviations: Step-to pattern Gait velocity: decreased   General Gait  Details: takes 3 lateral steps along bed   Stairs             Wheelchair Mobility    Modified Rankin (Stroke Patients Only)       Balance Overall balance assessment: Needs assistance;History of Falls Sitting-balance support: Single extremity supported;Feet supported Sitting balance-Leahy Scale: Poor Sitting balance - Comments: requires at least 1 UE support on bed or bed rail   Standing balance support: Bilateral upper extremity supported Standing balance-Leahy Scale: Poor Standing balance comment: needs B hands on RW for support nad +2 for safety                            Cognition Arousal/Alertness: Awake/alert Behavior During Therapy: WFL for tasks assessed/performed Overall Cognitive Status: Within Functional Limits for tasks assessed                                 General Comments: Pt emotional and anxious with mobility      Exercises Other Exercises Other Exercises: provided education re: self care, importance of OOB mobility for functional strengthening, endurance, and balance, safety and sequencing in sit to stand transfers with RW    General Comments General comments (skin integrity, edema, etc.): BP = 130/86 at rest, 133/90 after activity and returning to bedlevel      Pertinent Vitals/Pain Pain Assessment: Faces Faces Pain Scale: Hurts whole lot Pain Location: headache, pt unable to rate but says it is the worst she has ever felt.  Headache worsens with movement. Pain Descriptors / Indicators: Constant;Discomfort;Guarding;Grimacing;Crying;Moaning Pain Intervention(s): Limited activity within patient's tolerance;Monitored during session;Utilized relaxation techniques;Repositioned    Home Living                      Prior Function            PT Goals (current goals can now be found in the care plan section) Acute Rehab PT Goals Patient Stated Goal: feel better Progress towards PT goals: Not progressing toward  goals - comment    Frequency    7X/week      PT Plan Discharge plan needs to be updated    Co-evaluation PT/OT/SLP Co-Evaluation/Treatment: Yes Reason for Co-Treatment: Complexity of the patient's impairments (multi-system involvement);For patient/therapist safety PT goals addressed during session: Mobility/safety with mobility;Balance OT goals addressed during session: ADL's and self-care      AM-PAC PT "6 Clicks" Mobility   Outcome Measure  Help needed turning from your back to your side while in a flat bed without using bedrails?: A Little Help needed moving from lying on your back to sitting on the side of a flat bed without using bedrails?: A Lot Help needed moving to and from a bed to a chair (including a wheelchair)?: A Lot Help needed standing up from a  chair using your arms (e.g., wheelchair or bedside chair)?: A Lot Help needed to walk in hospital room?: A Lot Help needed climbing 3-5 steps with a railing? : Total 6 Click Score: 12    End of Session Equipment Utilized During Treatment: Gait belt Activity Tolerance: Patient limited by pain;Patient limited by fatigue Patient left: in bed;with call bell/phone within reach;with bed alarm set Nurse Communication: Mobility status Pain - Right/Left: Right     Time: [id number] PT Time Calculation (min) (ACUTE ONLY): 26 min  Charges:  $Therapeutic Activity: 8-22 mins                    Chesley Noon, PTA 07/16/19, 2:36 PM

## 2019-07-17 DIAGNOSIS — I63442 Cerebral infarction due to embolism of left cerebellar artery: Secondary | ICD-10-CM

## 2019-07-17 DIAGNOSIS — I63542 Cerebral infarction due to unspecified occlusion or stenosis of left cerebellar artery: Secondary | ICD-10-CM

## 2019-07-17 HISTORY — DX: Cerebral infarction due to unspecified occlusion or stenosis of left cerebellar artery: I63.542

## 2019-07-17 HISTORY — DX: Cerebral infarction due to embolism of left cerebellar artery: I63.442

## 2019-07-17 LAB — GLUCOSE, CAPILLARY
Glucose-Capillary: 273 mg/dL — ABNORMAL HIGH (ref 70–99)
Glucose-Capillary: 369 mg/dL — ABNORMAL HIGH (ref 70–99)
Glucose-Capillary: 213 mg/dL — ABNORMAL HIGH (ref 70–99)
Glucose-Capillary: 395 mg/dL — ABNORMAL HIGH (ref 70–99)

## 2019-07-17 MED ORDER — INSULIN ASPART PROT & ASPART (70-30 MIX) 100 UNIT/ML ~~LOC~~ SUSP
16.0000 [IU] | Freq: Two times a day (BID) | SUBCUTANEOUS | Status: DC
  Administered 2019-07-17 (×2): 16 [IU] via SUBCUTANEOUS
  Filled 2019-07-17 (×2): qty 10

## 2019-07-17 NOTE — Progress Notes (Addendum)
Inpatient Rehabilitation Admissions Coordinator  I met with patient at bedside to discuss goals and expectations of an inpt rehab admit. She asked appropriate questions. I will follow up on Monday to clarify bed availability. Hopeful to admit Monday. Pryor Montes RN CM, TOC team is aware.  Danne Baxter, RN, MSN Rehab Admissions Coordinator [phone] 07/17/2019 4:35 PM

## 2019-07-17 NOTE — Progress Notes (Signed)
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 Initial complaints includes HA, fall, and L arm numbness (approx 2 months). PMH includes partail pancreatectomy, Lupus, DM, and HTN. New CT revealed 1mm cerebellar shift to R of midling. CLeared to participate via neuro    PT Comments    First attempt this am pt asleep and did not awaken to verbal or tactile cues.  On second attempt she did awake but remains quite lethargic.  Slid down in bed with generally uncomfortable appearance holding her neck.  She was repositioned in bed with max a x 1 with little assistance.  She falls quickly asleep and needs constant verbal cues to maintain alertness.  Of note BP high again in flow charts so session was to focus on education regarding discharge options.  Discussed briefly discharge plan and options.  Pt told Pamala Hurry from [facility] is trying to call her and that answering is important.  Again discussed discharge options.  She listened to SNF vs CIR risks/benefits again and was surprised SNF meant staying at a facility in [place] vs going home.  She is able to voice and agree home is not realistic at this time but continues to state she does not want to stay anywhere and wants to go home.  Relayed to team.  Pt will need continued education and encouragement regarding discharge disposition.     Follow Up Recommendations  SNF;Other (comment)  If pt agrees to CIR it would be appropriate.  Discharge plan was changed yesterday due to her refusal for CIR and wanting SNF but seems confused regarding options and unrealistic in her expectations at this time.  Would benefit from continued conversations.      Equipment Recommendations  Rolling walker with 5" wheels;Wheelchair (measurements PT);3in1 (PT)    Recommendations for Other Services        Precautions / Restrictions Precautions Precautions: Fall Restrictions Weight Bearing Restrictions: No    Mobility  Bed Mobility Overal bed mobility: Needs Assistance             General bed mobility comments: lethargy today results in max a x 1 for bed repositioning.  Transfers                    Ambulation/Gait                 Stairs             Wheelchair Mobility    Modified Rankin (Stroke Patients Only)       Balance                                            Cognition Arousal/Alertness: Lethargic Behavior During Therapy: Flat affect Overall Cognitive Status: Within Functional Limits for tasks assessed                                 General Comments: lethargic today - she attributes it to medication      Exercises      General Comments        Pertinent Vitals/Pain Pain Assessment: Faces Faces Pain Scale: Hurts whole lot Pain Location: neck Pain  Descriptors / Indicators: Constant;Discomfort;Guarding;Grimacing;Moaning Pain Intervention(s): Limited activity within patient's tolerance;Monitored during session;Repositioned    Home Living                      Prior Function            PT Goals (current goals can now be found in the care plan section) Progress towards PT goals: Progressing toward goals    Frequency    7X/week      PT Plan Current plan remains appropriate;Other (comment)    Co-evaluation              AM-PAC PT "6 Clicks" Mobility   Outcome Measure  Help needed turning from your back to your side while in a flat bed without using bedrails?: A Little Help needed moving from lying on your back to sitting on the side of a flat bed without using bedrails?: A Lot Help needed moving to and from a bed to a chair (including a wheelchair)?: A Lot Help needed standing up from a chair using your arms (e.g., wheelchair or bedside chair)?: A Lot Help  needed to walk in hospital room?: A Lot Help needed climbing 3-5 steps with a railing? : Total 6 Click Score: 12    End of Session   Activity Tolerance: Patient limited by lethargy;Treatment limited secondary to medical complications (Comment) Patient left: in bed;with call bell/phone within reach;with bed alarm set Nurse Communication: Mobility status Pain - Right/Left: Right     Time: 0539-7673 PT Time Calculation (min) (ACUTE ONLY): 12 min  Charges:  $Therapeutic Activity: 8-22 mins                     Chesley Noon, PTA 07/17/19, 9:36 AM

## 2019-07-17 NOTE — Progress Notes (Addendum)
Inpatient Rehabilitation Admissions Coordinator  I spoke with patient by phone to again discuss CIR goals, cost and expectations. She would like to discuss with her daughter before proceeding and states unable to work with therapy today due to sever headache. She is in agreement for me to meet with her today at bedside to further discuss. I have updated acute team to include TOC, Jeanna. Please call me with any questions.  Danne Baxter, RN, MSN Rehab Admissions Coordinator [phone] 07/17/2019 11:06 AM

## 2019-07-17 NOTE — Progress Notes (Signed)
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 Initial complaints includes HA, fall, and L arm numbness (approx 2 months). PMH includes partail pancreatectomy, Lupus, DM, and HTN. New CT revealed 70mm cerebellar shift to R of midling. CLeared to participate via neuro    PT Comments    Session attempted again this pm.  Pt awake and appropriate.  Pt asking to use commode upon arrival.  Min a x 1 to EOB. She is able to stand with min/mod a x 1 and with close min assist transfer to commode at bedside where she sits quickly but controlled by Probation officer.  Voided large amount of urine and small BM.  She is able to provide her own self care but she is fearful at times of falling forward off of commode while trying to attend to needs.  Close supervision provided.  She is able to stand and pull up her own underwear with 1 UE assist.  She transfers back to bed with close min a x 1.  Positioned for comfort.  OF note HR does increase with transfer 138 max noted on V2 monitor.  She continues to report head and neck pain but it does not significantly interfere with transfer but she does rest her head on my shoulder while transferring.  She also has decided she wants to go to CIR in [place].  Stated she has talked with family and they have been supportive and encouraging her to go.  She has several family members that live in [place] and they have said they will be there as she needs.  Her daughter is also in agreement.  Team aware of her decision.  Will adjust discharge disposition back to CIR as it is the most appropriate for her at this time.   Follow Up Recommendations  CIR     Equipment Recommendations  Rolling walker with 5" wheels;Wheelchair (measurements PT);3in1 (PT)    Recommendations for Other Services       Precautions  / Restrictions Precautions Precautions: Fall Restrictions Weight Bearing Restrictions: No    Mobility  Bed Mobility Overal bed mobility: Needs Assistance Bed Mobility: Supine to Sit;Sit to Supine     Supine to sit: Min assist;Mod assist Sit to supine: Min assist   General bed mobility comments: assist to get trunk up off bed and to get LE's back on bed  Transfers Overall transfer level: Needs assistance Equipment used: Rolling walker (2 wheeled) Transfers: Sit to/from Stand Sit to Stand: Min assist;Mod assist            Ambulation/Gait Ambulation/Gait assistance: Min Web designer (Feet): 2 Feet Assistive device: Rolling walker (2 wheeled) Gait Pattern/deviations: Step-to pattern Gait velocity: decreased   General Gait Details: transfers to/from commode with close min gaurd/assist.  does sit quickly upon turning to commode but overall does well.   Stairs             Wheelchair Mobility    Modified Rankin (Stroke Patients Only)       Balance Overall balance assessment: Needs assistance;History of Falls Sitting-balance support: Single extremity supported;Feet supported Sitting balance-Leahy Scale: Poor Sitting balance - Comments: requires at least 1 UE support on bed or bed rail   Standing balance support: Bilateral upper extremity supported Standing balance-Leahy Scale: Poor Standing balance comment: needs B hands on RW for support  Cognition Arousal/Alertness: Awake/alert Behavior During Therapy: WFL for tasks assessed/performed Overall Cognitive Status: Within Functional Limits for tasks assessed                                 General Comments: alert this attempt and converses as appropriate      Exercises      General Comments        Pertinent Vitals/Pain Pain Assessment: Faces Faces Pain Scale: Hurts whole lot Pain Location: neck/headache Pain Descriptors / Indicators:  Constant;Discomfort;Guarding;Grimacing;Moaning Pain Intervention(s): Limited activity within patient's tolerance;Monitored during session;Repositioned    Home Living                      Prior Function            PT Goals (current goals can now be found in the care plan section) Progress towards PT goals: Progressing toward goals    Frequency    7X/week      PT Plan Discharge plan needs to be updated    Co-evaluation              AM-PAC PT "6 Clicks" Mobility   Outcome Measure  Help needed turning from your back to your side while in a flat bed without using bedrails?: A Little Help needed moving from lying on your back to sitting on the side of a flat bed without using bedrails?: A Little Help needed moving to and from a bed to a chair (including a wheelchair)?: A Lot Help needed standing up from a chair using your arms (e.g., wheelchair or bedside chair)?: A Lot Help needed to walk in hospital room?: A Lot Help needed climbing 3-5 steps with a railing? : Total 6 Click Score: 13    End of Session Equipment Utilized During Treatment: Gait belt Activity Tolerance: Patient tolerated treatment well;Treatment limited secondary to medical complications (Comment) Patient left: in bed;with call bell/phone within reach;with bed alarm set   Pain - Right/Left: Right     Time: 1102-1117 PT Time Calculation (min) (ACUTE ONLY): 23 min  Charges:  $Therapeutic Activity: 23-37 mins                    Chesley Noon, PTA 07/17/19, 3:38 PM

## 2019-07-17 NOTE — Progress Notes (Signed)
PROGRESS NOTE    Brittany [name]  WYO:[id number] DOB: 06-Nov-1960 DOA: 07/12/2019 PCP: [facility]   Chief complaint.  Headache.  Brief Narrative:  Brittany [name] a 59 y.o.femalewith medical history significant forpartial pancreatectomy secondary to low-grade dysplasia, on Pancrease and followed at [facility], history of discoid lupus on Plaquenil, uncontrolled diabetes, hypertension as well as C6-7 radiculopathy on epidural shots who presents to the emergency room with a 3-day complaint of nausea and vomiting,inability to hold anything down,high blood sugars at home, generalized malaise, headache, generalized weakness, insomnia.  She was treated in the emergency room with the insulin and IV fluids, her anion gap was normalized and she was admitted to the hospital for further treatment. Also had a CT head showed a left cerebellar stroke. Which was confirmed by MRI. Carotid ultrasound did not show significant occlusion. Patient has been followed by neurology.  6/2.  Patient had a worsening nausea vomiting.  Repeated CT scan showed left cerebellar stroke with worsening edema and mild midline shift.  Has been seen by neurology, deterioration from this point  is unlikely.   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 NovoLog was held last night as glucose was getting better.  Will reduce NovoLog dose again.  #2.  Acute left cerebellar stroke. Patient still has some complaints of hearing loss in the right ear, double vision and dizziness.  Appreciated neurology consult.  I discussed with the patient about the rehab options, she is agreeable to go  to rehab tomorrow.  3.  Nausea vomiting. Resolved.  4.  Essential hypertension. Continue to follow.  5.  Discoid lupus erythematous. Follow.  6.  Hypokalemia. Resolved.    DVT prophylaxis:Lovenox Code Status:Full Family Communication:None Disposition Plan:  Patient came from:Home  Anticipated d/c place: To rehab tomorrow.  Barriers to d/c OR conditions which need to be met to effect a safe d/c:   Consultants:  Neurology  Procedures:None Antimicrobials:None   Subjective: Patient woke up with some headache this morning.  She states that as she always has headaches when she woke up in the morning.  She no longer has any nausea vomiting.  She has good appetite.  No diarrhea constipation. She still feels dizzy with double vision when she turns her head.  She is unsteady on her feet. Denies any short of breath or cough. No dysuria hematuria pain No fever or chills.  Objective: Vitals:   07/16/19 1322 07/16/19 1532 07/16/19 2318 07/17/19 0809  BP: (!) 133/97 131/89 (!) 135/99 (!) 166/105  Pulse: 88 86 73 84  Resp:  _0 Temp:  98.2 F (36.8 C) 98.7 F (37.1 C) 98.5 F (36.9 C)  TempSrc:  Oral  Oral  SpO2:  100% 100% 98%  Weight:      Height:       No intake or output data in the 24 hours ending 07/17/19 0854 Filed Weights   07/12/19 1506 07/13/19 1125  Weight: 45.4 kg 47.3 kg    Examination:  General exam: Appears calm and comfortable  Respiratory system: Clear to auscultation. Respiratory effort normal. Cardiovascular system: S1 & S2 heard, RRR. No JVD, murmurs, rubs, gallops or clicks. No pedal edema.  Gastrointestinal system: Abdomen is nondistended, soft and nontender. No organomegaly or masses felt. Normal bowel sounds heard. Central nervous system: Alert and oriented. No focal neurological deficits. Extremities:  Symmetric  Skin: No rashes, lesions or ulcers Psychiatry: Judgement and insight appear normal. Mood & affect appropriate.     Data Reviewed: I have personally reviewed following labs and imaging studies  CBC: Recent Labs  Lab 07/12/19 1558 07/13/19 0517 07/16/19 0416  WBC 5.4 5.4 10.1  NEUTROABS  --   --  5.0  HGB 17.1* 13.9 14.7  HCT 49.6* 40.5 43.0  MCV 93.8 93.8 94.1  PLT 413* 401* 916   Basic Metabolic Panel: Recent Labs  Lab 07/12/19 1558 07/12/19 2024 07/13/19 0517 07/14/19 0632 07/16/19 0416  NA 131* 133* 133* 132* 138  K 3.8 2.9* 4.9 3.8 3.9  CL 90* 100 101 96* 95*  CO2 16* _0 32  GLUCOSE 406* 237* 231* 308* 104*  BUN _1 CREATININE 0.90 0.61 0.67 0.47 0.57  CALCIUM 9.3 7.8* 8.3* 8.4* 9.1  MG  --   --   --   --  1.7   GFR: Estimated Creatinine Clearance: 54.4 mL/min (by C-G formula based on SCr of 0.57 mg/dL). Liver Function Tests: Recent Labs  Lab 07/12/19 1558  AST 20  ALT 16  ALKPHOS 76  BILITOT 2.1*  PROT 8.2*  ALBUMIN 3.8   Recent Labs  Lab 07/12/19 1558  LIPASE 49   No results for input(s): AMMONIA in the last 168 hours. Coagulation Profile: No results for input(s): INR, PROTIME in the last 168 hours. Cardiac Enzymes: No results for input(s): CKTOTAL, CKMB, CKMBINDEX, TROPONINI in the last 168 hours. BNP (last 3 results) No results for input(s): PROBNP in the last 8760 hours. HbA1C: No results for input(s): HGBA1C in the last 72 hours. CBG: Recent Labs  Lab 07/16/19 0822 07/16/19 1157 07/16/19 1750 07/16/19 2116 07/17/19 0755  GLUCAP 184* 215* 137* 240* 273*   Lipid Profile: No results for input(s): CHOL, HDL, LDLCALC, TRIG, CHOLHDL, LDLDIRECT in the last 72 hours. Thyroid Function Tests: No results for input(s): TSH, T4TOTAL, FREET4, T3FREE, THYROIDAB in the last 72 hours. Anemia Panel: No results for input(s): VITAMINB12, FOLATE, FERRITIN, TIBC, IRON, RETICCTPCT in the last 72 hours. Sepsis Labs: No  results for input(s): PROCALCITON, LATICACIDVEN in the last 168 hours.  Recent Results (from the past 240 hour(s))  SARS Coronavirus 2 by RT PCR (hospital order, performed in [facility] hospital lab) Nasopharyngeal Nasopharyngeal Swab     Status: None   Collection Time: 07/13/19  7:20 AM   Specimen: Nasopharyngeal Swab  Result Value Ref Range Status   SARS Coronavirus 2 NEGATIVE NEGATIVE Final    Comment: (NOTE) SARS-CoV-2 target nucleic acids are NOT DETECTED. The SARS-CoV-2 RNA is generally detectable in upper and lower respiratory specimens during the acute phase of infection. The lowest concentration of SARS-CoV-2 viral copies this assay can detect is 250 copies / mL. A negative result does not preclude SARS-CoV-2 infection and should not be used as the sole basis for treatment or other patient management decisions.  A negative result may occur with improper specimen collection / handling, submission of specimen other than nasopharyngeal swab, presence of viral mutation(s) within the areas targeted by this assay, and inadequate number of viral copies (<250 copies / mL). A negative result must be combined with clinical observations, patient history, and epidemiological information. Fact Sheet for Patients:   StrictlyIdeas.no Fact Sheet for  Healthcare Providers: BankingDealers.co.za This test is not yet approved or cleared  by the [place] and has been authorized for detection and/or diagnosis of SARS-CoV-2 by FDA under an Emergency Use Authorization (EUA).  This EUA will remain in effect (meaning this test can be used) for the duration of the COVID-19 declaration under Section 564(b)(1) of the Act, 21 U.S.C. section 360bbb-3(b)(1), unless the authorization is terminated or revoked sooner. Performed at [facility], [address], [place], [place] [address]          Radiology Studies: No results  found.      Scheduled Meds: . aspirin  81 mg Oral Daily  . atorvastatin  80 mg Oral Daily  . benazepril  20 mg Oral Daily  . buPROPion  300 mg Oral Daily  . enoxaparin (LOVENOX) injection  40 mg Subcutaneous Q24H  . feeding supplement (GLUCERNA SHAKE)  237 mL Oral TID BM  . gabapentin  1,200 mg Oral QHS  . insulin aspart  0-5 Units Subcutaneous QHS  . insulin aspart  0-9 Units Subcutaneous TID WC  . insulin aspart protamine- aspart  16 Units Subcutaneous BID WC  . meclizine  6.25 mg Oral TID  . multivitamin with minerals  1 tablet Oral Daily   Continuous Infusions:   LOS: 4 days    Time spent: 28 minutes    Sharen Hones, MD [facility]   To contact the attending provider between 7A-7P or the covering provider during after hours 7P-7A, please log into the web site [email or web address] and access using universal [place] password for that web site. If you do not have the password, please call the hospital operator.  07/17/2019, 8:54 AM

## 2019-07-18 ENCOUNTER — Inpatient Hospital Stay: Payer: Medicare Other

## 2019-07-18 LAB — GLUCOSE, CAPILLARY
Glucose-Capillary: 300 mg/dL — ABNORMAL HIGH (ref 70–99)
Glucose-Capillary: 359 mg/dL — ABNORMAL HIGH (ref 70–99)
Glucose-Capillary: 316 mg/dL — ABNORMAL HIGH (ref 70–99)
Glucose-Capillary: 101 mg/dL — ABNORMAL HIGH (ref 70–99)

## 2019-07-18 MED ORDER — INSULIN ASPART PROT & ASPART (70-30 MIX) 100 UNIT/ML ~~LOC~~ SUSP
18.0000 [IU] | Freq: Two times a day (BID) | SUBCUTANEOUS | Status: DC
  Administered 2019-07-18 – 2019-07-19 (×2): 18 [IU] via SUBCUTANEOUS
  Filled 2019-07-18 (×2): qty 10

## 2019-07-18 NOTE — Progress Notes (Signed)
Physical Therapy Treatment Patient Details Name: Brittany [name] MRN: [id number] DOB: [birth year] Today's Date: 07/18/2019    History of Present Illness Pt admitted for DKA with + CVA in L cerebellum along with petchial hemorrhage. Initial complaints includes HA, fall, and L arm numbness (approx 2 months). PMH includes partail pancreatectomy, Lupus, DM, and HTN. New CT revealed 32mm cerebellar shift to R of midling. CLeared to participate via neuro    PT Comments    Pt was long sitting in bed upon arriving. 2nd attempt to work with pt this date. She agrees to PT session and is cooperative and pleasant throughout. She does have several occasions during session when she becomes very tearful.therapist educated pt on stroke and explained that it is not uncommon for emotions to be heightened after having stroke. She does agree to [employer] activity. Pt was able to exit L side of bed with min assist. Safe EOB x several minutes prior to standing and ambulating 10 ft to recliner. HR elevated to 128 bpm and RR to 24 bpm. Overall she tolerated well and reports she did ambulate to BR earlier in day with RN staff. Pt requested to rest after ambulation to recliner. BLEs were elevated, with chair alarm in place, and call bell in reach. Acute PT will continue to follow per POC. PT continues recommendation for DC to CIR at DC.      Follow Up Recommendations  CIR     Equipment Recommendations  Rolling walker with 5" wheels;Wheelchair (measurements PT);3in1 (PT)    Recommendations for Other Services       Precautions / Restrictions Precautions Precautions: Fall Restrictions Weight Bearing Restrictions: No    Mobility  Bed Mobility Overal bed mobility: Needs Assistance Bed Mobility: Supine to Sit     Supine to sit: HOB elevated;Min assist     General bed mobility comments: Min assist to progress from long sitting to short sitting. sat EOB x several minutes. pt very tearful but after several minutes does  agree to OOB to reliner.   Transfers Overall transfer level: Needs assistance Equipment used: Rolling walker (2 wheeled) Transfers: Sit to/from Stand Sit to Stand: Min assist         General transfer comment: Min assis for safety to STS from EOB. performed STS several times EOB prior to ambulation ~ 10 ft to recliner. pt was able to progress transfers from min assist to CGA with vcs for proper technique/sequencing  Ambulation/Gait Ambulation/Gait assistance: Min assist Gait Distance (Feet): 10 Feet Assistive device: Rolling walker (2 wheeled) Gait Pattern/deviations: Step-to pattern Gait velocity: decreased   General Gait Details: pt was able to ambulate with RN staff earlier in day to [place]. she was able to ambulate to recliner during session with min assist + vcs for gait sequencing. Overall pt tolerated ambulation well but HR did elevated to upper 120s with RR up to 24 bpm   Stairs             Wheelchair Mobility    Modified Rankin (Stroke Patients Only)       Balance Overall balance assessment: Needs assistance;History of Falls Sitting-balance support: Feet supported Sitting balance-Leahy Scale: Fair Sitting balance - Comments: pt was able to maintain balance while at EOB with feet supported on floor + CGA for safety. pt very tearful while sitting EOB   Standing balance support: Bilateral upper extremity supported Standing balance-Leahy Scale: Fair Standing balance comment: needs B hands on RW for support  Cognition Arousal/Alertness: Awake/alert Behavior During Therapy: WFL for tasks assessed/performed Overall Cognitive Status: Within Functional Limits for tasks assessed                                 General Comments: pt is A and O. she was very tearful at times. therapist educated pt on this being normal side effect to having stroke. Several times during session requires consoling prior to progressing to  other activities.      Exercises      General Comments        Pertinent Vitals/Pain Pain Assessment: No/denies pain(just headache) Pain Score: 0-No pain Pain Location: neck/headache Pain Descriptors / Indicators: Constant;Discomfort Pain Intervention(s): Limited activity within patient's tolerance;Monitored during session;Premedicated before session    Home Living                      Prior Function            PT Goals (current goals can now be found in the care plan section) Acute Rehab PT Goals Patient Stated Goal: " I want to move better" Progress towards PT goals: Progressing toward goals    Frequency    7X/week      PT Plan Current plan remains appropriate    Co-evaluation     PT goals addressed during session: Mobility/safety with mobility;Balance;Proper use of DME;Strengthening/ROM        AM-PAC PT "6 Clicks" Mobility   Outcome Measure  Help needed turning from your back to your side while in a flat bed without using bedrails?: A Little Help needed moving from lying on your back to sitting on the side of a flat bed without using bedrails?: A Little Help needed moving to and from a bed to a chair (including a wheelchair)?: A Little Help needed standing up from a chair using your arms (e.g., wheelchair or bedside chair)?: A Little Help needed to walk in hospital room?: A Lot Help needed climbing 3-5 steps with a railing? : A Lot 6 Click Score: 16    End of Session Equipment Utilized During Treatment: Gait belt Activity Tolerance: Patient tolerated treatment well Patient left: in chair;with call bell/phone within reach;with chair alarm set Nurse Communication: Mobility status PT Visit Diagnosis: Muscle weakness (generalized) (M62.81);Difficulty in walking, not elsewhere classified (R26.2);Ataxic gait (R26.0);Unsteadiness on feet (R26.81);History of falling (Z91.81);Pain Pain - Right/Left: Right     Time: 1696-7893 PT Time Calculation  (min) (ACUTE ONLY): 20 min  Charges:  $Therapeutic Activity: 8-22 mins                     Julaine Fusi PTA 07/18/19, 3:44 PM

## 2019-07-18 NOTE — Progress Notes (Signed)
   07/18/19 1600  Clinical Encounter Type  Visited With Patient  Visit Type Follow-up  Referral From Chaplain  Consult/Referral To Chaplain  Visited Pt while rounding unit. Pt was very upset. I tried to calm Pt down. I have follow-up with Pt almost every day. Today was a bad day for Pt. Ch will follow-up with Pt later.

## 2019-07-18 NOTE — Progress Notes (Signed)
PROGRESS NOTE    Brittany [name]  GYI:[id number] DOB: [birth year] DOA: 07/12/2019 PCP: [facility]   Chief complaint.  Headache.  Brief Narrative:  Brittany [name] a 59 y.o.femalewith medical history significant forpartial pancreatectomy secondary to low-grade dysplasia, on Pancrease and followed at [facility], history of discoid lupus on Plaquenil, uncontrolled diabetes, hypertension as well as C6-7 radiculopathy on epidural shots who presents to the emergency room with a 3-day complaint of nausea and vomiting,inability to hold anything down,high blood sugars at home, generalized malaise, headache, generalized weakness, insomnia.  She was treated in the emergency room with the insulin and IV fluids, her anion gap was normalized and she was admitted to the hospital for further treatment. Also had a CT head showed a left cerebellar stroke. Which was confirmed by MRI. Carotid ultrasound did not show significant occlusion. Patient has been followed by neurology.  6/2.Patient had a worsening nausea vomiting. Repeated CT scan showed left cerebellar stroke withworsening edema and mild midline shift. Has been seen by neurology, deterioration from this pointis unlikely.  6/5.  Patient condition had improved, still has some headaches.  No nausea anymore.  Repeated CT scan.  Patient is scheduled to be transferred to CIR on Monday.    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 Condition has improved.  However, glucose still labile.   Continue insulin 70/30 at 18 units twice a day plus sliding scale insulin.  2.  Acute left cerebellar stroke. Clinically improved.  Still has some headache when she woke up.  I will obtain a CT scan to see if edema is improving.  Essential hypertension. Continue to follow.  4.  Discoid lupus erythematosus. Follow.  5.  Hypokalemia.  Resolved.   DVT prophylaxis:Lovenox Code Status:Full Family Communication:None Disposition Plan:  Patient came from:Home  Anticipated d/c place: To rehab tomorrow.  Barriers to d/c OR conditions which need to be met to effect a safe d/c:   Consultants:  Neurology  Procedures:None Antimicrobials:None   Subjective: Patient nausea vomiting has resolved.  Still has some headache and when she woke up in the morning.  She still feels dizzy and double vision, but is seem to be improving.  She has a very good appetite.  She does not have any short of breath or cough.  No dysuria hematuria.  Objective: Vitals:   07/17/19 1623 07/17/19 2100 07/17/19 2334 07/18/19 0803  BP: (!) 132/103  (!) 134/98 (!) 148/109  Pulse: (!) 102 89 91   Resp: _0 Temp: 98.6 F (37 C) 99 F (37.2 C) 99.4 F (37.4 C) 98.5 F (36.9 C)  TempSrc: Oral  Oral Oral  SpO2: 100% 98% 100% 97%  Weight:      Height:        Intake/Output Summary (Last 24 hours) at 07/18/2019 0817 Last data filed at 07/17/2019 1800 Gross per 24 hour  Intake 240 ml  Output --  Net 240 ml   Filed Weights   07/12/19 1506 07/13/19 1125  Weight: 45.4 kg 47.3 kg    Examination:  General exam: Appears calm and comfortable  Respiratory system: Clear to auscultation. Respiratory effort normal. Cardiovascular system: S1 & S2 heard, RRR. No JVD, murmurs, rubs, gallops or clicks. No pedal edema. Gastrointestinal system: Abdomen is nondistended, soft and  nontender. No organomegaly or masses felt. Normal bowel sounds heard. Central nervous system: Alert and oriented. No focal neurological deficits. Extremities: Symmetric  Skin: No rashes, lesions or ulcers Psychiatry: Judgement and insight appear normal. Mood & affect appropriate.     Data Reviewed: I have personally reviewed following labs and imaging studies  CBC: Recent Labs  Lab 07/12/19 1558 07/13/19 0517 07/16/19 0416  WBC 5.4 5.4 10.1  NEUTROABS  --   --  5.0  HGB 17.1* 13.9 14.7  HCT 49.6* 40.5 43.0  MCV 93.8 93.8 94.1  PLT 413* 401* 837   Basic Metabolic Panel: Recent Labs  Lab 07/12/19 1558 07/12/19 2024 07/13/19 0517 07/14/19 0632 07/16/19 0416  NA 131* 133* 133* 132* 138  K 3.8 2.9* 4.9 3.8 3.9  CL 90* 100 101 96* 95*  CO2 16* _0 32  GLUCOSE 406* 237* 231* 308* 104*  BUN _1 CREATININE 0.90 0.61 0.67 0.47 0.57  CALCIUM 9.3 7.8* 8.3* 8.4* 9.1  MG  --   --   --   --  1.7   GFR: Estimated Creatinine Clearance: 54.4 mL/min (by C-G formula based on SCr of 0.57 mg/dL). Liver Function Tests: Recent Labs  Lab 07/12/19 1558  AST 20  ALT 16  ALKPHOS 76  BILITOT 2.1*  PROT 8.2*  ALBUMIN 3.8   Recent Labs  Lab 07/12/19 1558  LIPASE 49   No results for input(s): AMMONIA in the last 168 hours. Coagulation Profile: No results for input(s): INR, PROTIME in the last 168 hours. Cardiac Enzymes: No results for input(s): CKTOTAL, CKMB, CKMBINDEX, TROPONINI in the last 168 hours. BNP (last 3 results) No results for input(s): PROBNP in the last 8760 hours. HbA1C: No results for input(s): HGBA1C in the last 72 hours. CBG: Recent Labs  Lab 07/17/19 0755 07/17/19 1210 07/17/19 1627 07/17/19 2137 07/18/19 0801  GLUCAP 273* 369* 213* 395* 300*   Lipid Profile: No results for input(s): CHOL, HDL, LDLCALC, TRIG, CHOLHDL, LDLDIRECT in the last 72 hours. Thyroid Function Tests: No results for input(s): TSH, T4TOTAL, FREET4, T3FREE,  THYROIDAB in the last 72 hours. Anemia Panel: No results for input(s): VITAMINB12, FOLATE, FERRITIN, TIBC, IRON, RETICCTPCT in the last 72 hours. Sepsis Labs: No results for input(s): PROCALCITON, LATICACIDVEN in the last 168 hours.  Recent Results (from the past 240 hour(s))  SARS Coronavirus 2 by RT PCR (hospital order, performed in [facility] hospital lab) Nasopharyngeal Nasopharyngeal Swab     Status: None   Collection Time: 07/13/19  7:20 AM   Specimen: Nasopharyngeal Swab  Result Value Ref Range Status   SARS Coronavirus 2 NEGATIVE NEGATIVE Final    Comment: (NOTE) SARS-CoV-2 target nucleic acids are NOT DETECTED. The SARS-CoV-2 RNA is generally detectable in upper and lower respiratory specimens during the acute phase of infection. The lowest concentration of SARS-CoV-2 viral copies this assay can detect is 250 copies / mL. A negative result does not preclude SARS-CoV-2 infection and should not be used as the sole basis for treatment or other patient management decisions.  A negative result may occur with improper specimen collection / handling, submission of specimen other than nasopharyngeal swab, presence of viral mutation(s) within the areas targeted by this assay, and inadequate  number of viral copies (<250 copies / mL). A negative result must be combined with clinical observations, patient history, and epidemiological information. Fact Sheet for Patients:   StrictlyIdeas.no Fact Sheet for Healthcare Providers: BankingDealers.co.za This test is not yet approved or cleared  by the [place] FDA and has been authorized for detection and/or diagnosis of SARS-CoV-2 by FDA under an Emergency Use Authorization (EUA).  This EUA will remain in effect (meaning this test can be used) for the duration of the COVID-19 declaration under Section 564(b)(1) of the Act, 21 U.S.C. section 360bbb-3(b)(1), unless the authorization is  terminated or revoked sooner. Performed at [facility], [address], [place], [place] [address]          Radiology Studies: No results found.      Scheduled Meds: . aspirin  81 mg Oral Daily  . atorvastatin  80 mg Oral Daily  . benazepril  20 mg Oral Daily  . buPROPion  300 mg Oral Daily  . enoxaparin (LOVENOX) injection  40 mg Subcutaneous Q24H  . feeding supplement (GLUCERNA SHAKE)  237 mL Oral TID BM  . gabapentin  1,200 mg Oral QHS  . insulin aspart  0-5 Units Subcutaneous QHS  . insulin aspart  0-9 Units Subcutaneous TID WC  . insulin aspart protamine- aspart  16 Units Subcutaneous BID WC  . meclizine  6.25 mg Oral TID  . multivitamin with minerals  1 tablet Oral Daily   Continuous Infusions:   LOS: 5 days    Time spent: 25 minutes    Sharen Hones, MD [facility]   To contact the attending provider between 7A-7P or the covering provider during after hours 7P-7A, please log into the web site [email or web address] and access using universal [place] password for that web site. If you do not have the password, please call the hospital operator.  07/18/2019, 8:17 AM

## 2019-07-19 LAB — GLUCOSE, CAPILLARY
Glucose-Capillary: 330 mg/dL — ABNORMAL HIGH (ref 70–99)
Glucose-Capillary: 324 mg/dL — ABNORMAL HIGH (ref 70–99)
Glucose-Capillary: 157 mg/dL — ABNORMAL HIGH (ref 70–99)
Glucose-Capillary: 321 mg/dL — ABNORMAL HIGH (ref 70–99)

## 2019-07-19 MED ORDER — INSULIN GLARGINE 100 UNIT/ML ~~LOC~~ SOLN
28.0000 [IU] | Freq: Every day | SUBCUTANEOUS | Status: DC
  Administered 2019-07-19: 28 [IU] via SUBCUTANEOUS
  Filled 2019-07-19 (×2): qty 0.28

## 2019-07-19 NOTE — Progress Notes (Signed)
PROGRESS NOTE    Brittany [name]  UTM:[id number] DOB: July 21, 1960 DOA: 07/12/2019 PCP: [facility]   Chief complaint.  Headache.  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  She was treated in the emergency room with the insulin and IV fluids, her anion gap was normalized and she was admitted to the hospital for further treatment. Also had a CT head showed a left cerebellar stroke. Which was confirmed by MRI. Carotid ultrasound did not show significant occlusion. Patient has been followed by neurology.  6/2.Patient had a worsening nausea vomiting. Repeated CT scan showed left cerebellar stroke withworsening edema and mild midline shift. Has been seen by neurology, deterioration from this pointis unlikely.  6/5.  Patient condition had improved, still has some headaches.  No nausea anymore.  Repeated CT scan.  Patient is scheduled to be transferred to CIR on Monday.  6/6.  CT scan of the brain reviewed, showed a left large infarct with edema, not worsened than previous.  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 Patient still has a labile glucose.  I will change scheduled insulin to Lantus to avoid the peak.  I will start with 28 units every evening, which is reduced from her 70/30 dose.  I will modify dose as needed.  Continue sliding scale insulin.  #2.  Acute left cerebellar stroke. Condition stable.  Pending rehab transfer on Monday.  Repeated CT head did not show much change compared to previous CT scan.  3.  Essential hypertension. Continue to follow.  4.  Discoid lupus erythematous. Follow.  5.  Hypokalemia.  Resolved.  DVT prophylaxis:Lovenox Code Status:Full Family Communication:None Disposition Plan:  Patient came from:Home  Anticipated d/c place:To rehab tomorrow (Monday)  Barriers to d/c OR conditions which need to be met to effect a safe d/c:   Consultants:  Neurology  Procedures:None Antimicrobials:None    Subjective: Patient is very emotional when talking about her conditions. Patient still complaining some headache.  Nausea vomiting has resolved.  Still cannot hear in the right ear.  Still feels some dizziness and double vision, but gradually getting better. No abdominal pain or diarrhea today.  Good appetite. Denies any short of breath or cough.  Objective: Vitals:   07/18/19 1500 07/18/19 1600 07/18/19 2334 07/19/19 0819  BP: (!) 166/116 (!) 146/87 133/87 (!) 149/99  Pulse: (!) 114   85  Resp: '16 18 15 19  ' Temp: 98.5 F (36.9 C)  98 F (36.7 C) 98.7 F (37.1 C)  TempSrc: Oral  Oral Oral  SpO2: 97% 97%  98%  Weight:      Height:       No intake or output data in the 24 hours ending  07/19/19 0832 Filed Weights   07/12/19 1506 07/13/19 1125  Weight: 45.4 kg 47.3 kg    Examination:  General exam: Appears calm and comfortable  Respiratory system: Clear to  auscultation. Respiratory effort normal. Cardiovascular system: S1 & S2 heard, RRR. No JVD, murmurs, rubs, gallops or clicks. No pedal edema. Gastrointestinal system: Abdomen is nondistended, soft and nontender. No organomegaly or masses felt. Normal bowel sounds heard. Central nervous system: Alert and oriented. No focal neurological deficits. Extremities: Symmetric 5 x 5 power. Skin: No rashes, lesions or ulcers Psychiatry: Judgement and insight appear normal. Mood & affect appropriate.     Data Reviewed: I have personally reviewed following labs and imaging studies  CBC: Recent Labs  Lab 07/12/19 1558 07/13/19 0517 07/16/19 0416  WBC 5.4 5.4 10.1  NEUTROABS  --   --  5.0  HGB 17.1* 13.9 14.7  HCT 49.6* 40.5 43.0  MCV 93.8 93.8 94.1  PLT 413* 401* 798   Basic Metabolic Panel: Recent Labs  Lab 07/12/19 1558 07/12/19 2024 07/13/19 0517 07/14/19 0632 07/16/19 0416  NA 131* 133* 133* 132* 138  K 3.8 2.9* 4.9 3.8 3.9  CL 90* 100 101 96* 95*  CO2 16* '24 23 26 ' 32  GLUCOSE 406* 237* 231* 308* 104*  BUN '19 18 17 6 9  ' CREATININE 0.90 0.61 0.67 0.47 0.57  CALCIUM 9.3 7.8* 8.3* 8.4* 9.1  MG  --   --   --   --  1.7   GFR: Estimated Creatinine Clearance: 54.4 mL/min (by C-G formula based on SCr of 0.57 mg/dL). Liver Function Tests: Recent Labs  Lab 07/12/19 1558  AST 20  ALT 16  ALKPHOS 76  BILITOT 2.1*  PROT 8.2*  ALBUMIN 3.8   Recent Labs  Lab 07/12/19 1558  LIPASE 49   No results for input(s): AMMONIA in the last 168 hours. Coagulation Profile: No results for input(s): INR, PROTIME in the last 168 hours. Cardiac Enzymes: No results for input(s): CKTOTAL, CKMB, CKMBINDEX, TROPONINI in the last 168 hours. BNP (last 3 results) No results for input(s): PROBNP in the last 8760 hours. HbA1C: No results for input(s): HGBA1C in the last 72 hours. CBG: Recent Labs  Lab 07/18/19 0801 07/18/19 1127 07/18/19 1624 07/18/19 2131 07/19/19 0734  GLUCAP 300* 359*  316* 101* 330*   Lipid Profile: No results for input(s): CHOL, HDL, LDLCALC, TRIG, CHOLHDL, LDLDIRECT in the last 72 hours. Thyroid Function Tests: No results for input(s): TSH, T4TOTAL, FREET4, T3FREE, THYROIDAB in the last 72 hours. Anemia Panel: No results for input(s): VITAMINB12, FOLATE, FERRITIN, TIBC, IRON, RETICCTPCT in the last 72 hours. Sepsis Labs: No results for input(s): PROCALCITON, LATICACIDVEN in the last 168 hours.  Recent Results (from the past 240 hour(s))  SARS Coronavirus 2 by RT PCR (hospital order, performed in [facility] hospital lab) Nasopharyngeal Nasopharyngeal Swab     Status: None   Collection Time: 07/13/19  7:20 AM   Specimen: Nasopharyngeal Swab  Result Value Ref Range Status   SARS Coronavirus 2 NEGATIVE NEGATIVE Final    Comment: (NOTE) SARS-CoV-2 target nucleic acids are NOT DETECTED. The SARS-CoV-2 RNA is generally detectable in upper and lower respiratory specimens during the acute phase of infection. The lowest concentration of SARS-CoV-2 viral copies this assay can detect is 250 copies / mL. A negative result does not preclude SARS-CoV-2 infection and should not be used as the sole basis for treatment or other patient management decisions.  A negative result may occur with improper  specimen collection / handling, submission of specimen other than nasopharyngeal swab, presence of viral mutation(s) within the areas targeted by this assay, and inadequate number of viral copies (<250 copies / mL). A negative result must be combined with clinical observations, patient history, and epidemiological information. Fact Sheet for Patients:   StrictlyIdeas.no Fact Sheet for Healthcare Providers: BankingDealers.co.za This test is not yet approved or cleared  by the [place] FDA and has been authorized for detection and/or diagnosis of SARS-CoV-2 by FDA under an Emergency Use Authorization (EUA).  This  EUA will remain in effect (meaning this test can be used) for the duration of the COVID-19 declaration under Section 564(b)(1) of the Act, 21 U.S.C. section 360bbb-3(b)(1), unless the authorization is terminated or revoked sooner. Performed at [facility], [address], [place], [place] [address]          Radiology Studies: CT HEAD WO CONTRAST  Result Date: 07/18/2019 CLINICAL DATA:  Follow-up cerebellar stroke. EXAM: CT HEAD WITHOUT CONTRAST TECHNIQUE: Contiguous axial images were obtained from the base of the skull through the vertex without intravenous contrast. COMPARISON:  07/14/2019 FINDINGS: Brain: Extensive infarction affecting the left cerebellar hemisphere and cerebellar vermis as seen previously with swelling and mass effect but no interval hemorrhage. Narrowing of the fourth ventricle,, with very slight increase in size of the lateral and third ventricles. Chronic small-vessel ischemic changes again evident within the hemispheric white matter. Old right frontal cortical and subcortical infarction. Vascular: There is atherosclerotic calcification of the major vessels at the base of the brain. Skull: Negative Sinuses/Orbits: Clear/normal Other: None IMPRESSION: Large left cerebellar infarction as seen previously. No evidence of hemorrhagic transformation. Mass-effect upon the fourth ventricle. Mild increase in size of the lateral and third ventricles since the previous study indicates some degree of fourth ventricular obstruction. Electronically Signed   By: Nelson Chimes M.D.   On: 07/18/2019 10:35        Scheduled Meds: . aspirin  81 mg Oral Daily  . atorvastatin  80 mg Oral Daily  . benazepril  20 mg Oral Daily  . buPROPion  300 mg Oral Daily  . enoxaparin (LOVENOX) injection  40 mg Subcutaneous Q24H  . feeding supplement (GLUCERNA SHAKE)  237 mL Oral TID BM  . gabapentin  1,200 mg Oral QHS  . insulin aspart  0-5 Units Subcutaneous QHS  . insulin aspart  0-9  Units Subcutaneous TID WC  . insulin aspart protamine- aspart  18 Units Subcutaneous BID WC  . meclizine  6.25 mg Oral TID  . multivitamin with minerals  1 tablet Oral Daily   Continuous Infusions:   LOS: 6 days    Time spent: 28 minutes    Sharen Hones, MD [facility]   To contact the attending provider between 7A-7P or the covering provider during after hours 7P-7A, please log into the web site [email or web address] and access using universal [place] password for that web site. If you do not have the password, please call the hospital operator.  07/19/2019, 8:32 AM

## 2019-07-19 NOTE — Plan of Care (Signed)
  Problem: Education: Goal: Knowledge of disease or condition will improve Outcome: Progressing Goal: Knowledge of patient specific risk factors addressed and post discharge goals established will improve Outcome: Progressing   Problem: Nutrition: Goal: Risk of aspiration will decrease Outcome: Progressing

## 2019-07-19 NOTE — Progress Notes (Signed)
Physical Therapy Treatment Patient Details Name: Brittany [name] MRN: [id number] DOB: [birth year] Today's Date: 07/19/2019    History of Present Illness Pt admitted for DKA with + CVA in L cerebellum along with petchial hemorrhage. Initial complaints includes HA, fall, and L arm numbness (approx 2 months). PMH includes partail pancreatectomy, Lupus, DM, and HTN. New CT revealed 10mm cerebellar shift to R of midling. CLeared to participate via neuro    PT Comments    Pt ready for session and motivated to participate today "Whatever you need me to do."  To EOB with min a x 1.  Steady in sitting.  Participated in exercises as described below.  Transferred to recliner with RW and min a x 1.  HR increased to 128 with transfer.  Seated rest,  She was then able to progress gait 10' in room with turn with very slow steps but excellent effort.  HR more stable with gait in mid 110's to mid 120's.    Pt remains quite labile during session.  Stated she cries a lot at baseline.  She voices frustration over feeling as she needs to be home to help with her granddaughter.  Her daughter seems to rely heavily on her at home.  Encouragement provided.  CIR remains appropriate for discharge plan.   Follow Up Recommendations  CIR     Equipment Recommendations  Rolling walker with 5" wheels;3in1 (PT)    Recommendations for Other Services       Precautions / Restrictions Precautions Precautions: Fall Restrictions Weight Bearing Restrictions: No    Mobility  Bed Mobility Overal bed mobility: Needs Assistance Bed Mobility: Supine to Sit Rolling: Min assist            Transfers Overall transfer level: Needs assistance Equipment used: Rolling walker (2 wheeled) Transfers: Sit to/from Stand Sit to Stand: Min assist            Ambulation/Gait Ambulation/Gait assistance: Min Web designer (Feet): 10 Feet Assistive device: Rolling walker (2 wheeled) Gait Pattern/deviations: Step-through  pattern Gait velocity: decreased   General Gait Details: Very slow deliberate steps with excellent effort.  No LOB or buckling.  HR 110'120's during gait.   Stairs             Wheelchair Mobility    Modified Rankin (Stroke Patients Only)       Balance Overall balance assessment: Needs assistance;History of Falls   Sitting balance-Leahy Scale: Fair     Standing balance support: Bilateral upper extremity supported Standing balance-Leahy Scale: Fair Standing balance comment: needs B hands on RW for support                            Cognition Arousal/Alertness: Awake/alert Behavior During Therapy: WFL for tasks assessed/performed Overall Cognitive Status: Within Functional Limits for tasks assessed                                 General Comments: tearful at times.  encouragement given.  Voiced frustration over daughters dependance on her and feeling that she needs to get home to help.      Exercises Other Exercises Other Exercises: standing ex with walker - marching in place, SLR and heel raises x 10-15 (self selected)    General Comments        Pertinent Vitals/Pain Pain Assessment: Faces Faces Pain Scale: Hurts a little bit Pain  Location: neck/headache Pain Descriptors / Indicators: Constant;Discomfort Pain Intervention(s): Limited activity within patient's tolerance;Monitored during session;Premedicated before session    Home Living                      Prior Function            PT Goals (current goals can now be found in the care plan section) Progress towards PT goals: Progressing toward goals    Frequency    7X/week      PT Plan Current plan remains appropriate    Co-evaluation              AM-PAC PT "6 Clicks" Mobility   Outcome Measure  Help needed turning from your back to your side while in a flat bed without using bedrails?: A Little Help needed moving from lying on your back to sitting  on the side of a flat bed without using bedrails?: A Little Help needed moving to and from a bed to a chair (including a wheelchair)?: A Little Help needed standing up from a chair using your arms (e.g., wheelchair or bedside chair)?: A Little Help needed to walk in hospital room?: A Little Help needed climbing 3-5 steps with a railing? : A Lot 6 Click Score: 17    End of Session Equipment Utilized During Treatment: Gait belt Activity Tolerance: Patient tolerated treatment well Patient left: in chair;with call bell/phone within reach;with chair alarm set Nurse Communication: Mobility status Pain - Right/Left: Right     Time: 1100-1138 PT Time Calculation (min) (ACUTE ONLY): 38 min  Charges:  $Gait Training: 8-22 mins $Therapeutic Exercise: 8-22 mins $Therapeutic Activity: 8-22 mins                    Chesley Noon, PTA 07/19/19, 12:57 PM

## 2019-07-20 ENCOUNTER — Encounter (HOSPITAL_COMMUNITY): Payer: Self-pay | Admitting: Physical Medicine & Rehabilitation

## 2019-07-20 ENCOUNTER — Other Ambulatory Visit: Payer: Self-pay

## 2019-07-20 ENCOUNTER — Inpatient Hospital Stay (HOSPITAL_COMMUNITY)
Admission: RE | Admit: 2019-07-20 | Discharge: 2019-08-04 | DRG: 057 | Disposition: A | Discharge status: Home Health | Payer: Medicare Other | Source: Other Acute Inpatient Hospital | Attending: Physical Medicine & Rehabilitation | Admitting: Physical Medicine & Rehabilitation

## 2019-07-20 ENCOUNTER — Encounter: Payer: Self-pay | Admitting: Internal Medicine

## 2019-07-20 DIAGNOSIS — G4700 Insomnia, unspecified: Secondary | ICD-10-CM | POA: Diagnosis present

## 2019-07-20 DIAGNOSIS — L930 Discoid lupus erythematosus: Secondary | ICD-10-CM | POA: Diagnosis present

## 2019-07-20 DIAGNOSIS — E1165 Type 2 diabetes mellitus with hyperglycemia: Secondary | ICD-10-CM | POA: Diagnosis present

## 2019-07-20 DIAGNOSIS — E119 Type 2 diabetes mellitus without complications: Secondary | ICD-10-CM | POA: Diagnosis not present

## 2019-07-20 DIAGNOSIS — R42 Dizziness and giddiness: Secondary | ICD-10-CM | POA: Diagnosis present

## 2019-07-20 DIAGNOSIS — H538 Other visual disturbances: Secondary | ICD-10-CM | POA: Diagnosis present

## 2019-07-20 DIAGNOSIS — E1110 Type 2 diabetes mellitus with ketoacidosis without coma: Secondary | ICD-10-CM | POA: Diagnosis not present

## 2019-07-20 DIAGNOSIS — E891 Postprocedural hypoinsulinemia: Secondary | ICD-10-CM | POA: Diagnosis present

## 2019-07-20 DIAGNOSIS — Z90410 Acquired total absence of pancreas: Secondary | ICD-10-CM

## 2019-07-20 DIAGNOSIS — I69398 Other sequelae of cerebral infarction: Secondary | ICD-10-CM | POA: Diagnosis present

## 2019-07-20 DIAGNOSIS — M4722 Other spondylosis with radiculopathy, cervical region: Secondary | ICD-10-CM | POA: Diagnosis not present

## 2019-07-20 DIAGNOSIS — E785 Hyperlipidemia, unspecified: Secondary | ICD-10-CM | POA: Diagnosis present

## 2019-07-20 DIAGNOSIS — I63442 Cerebral infarction due to embolism of left cerebellar artery: Secondary | ICD-10-CM | POA: Diagnosis not present

## 2019-07-20 DIAGNOSIS — M5412 Radiculopathy, cervical region: Secondary | ICD-10-CM | POA: Diagnosis present

## 2019-07-20 DIAGNOSIS — Z7982 Long term (current) use of aspirin: Secondary | ICD-10-CM

## 2019-07-20 DIAGNOSIS — E440 Moderate protein-calorie malnutrition: Secondary | ICD-10-CM | POA: Diagnosis present

## 2019-07-20 DIAGNOSIS — N3289 Other specified disorders of bladder: Secondary | ICD-10-CM | POA: Diagnosis present

## 2019-07-20 DIAGNOSIS — E11649 Type 2 diabetes mellitus with hypoglycemia without coma: Secondary | ICD-10-CM | POA: Diagnosis not present

## 2019-07-20 DIAGNOSIS — G2581 Restless legs syndrome: Secondary | ICD-10-CM | POA: Diagnosis present

## 2019-07-20 DIAGNOSIS — I69393 Ataxia following cerebral infarction: Principal | ICD-10-CM

## 2019-07-20 DIAGNOSIS — Z6820 Body mass index (BMI) 20.0-20.9, adult: Secondary | ICD-10-CM

## 2019-07-20 DIAGNOSIS — E538 Deficiency of other specified B group vitamins: Secondary | ICD-10-CM | POA: Diagnosis present

## 2019-07-20 DIAGNOSIS — R2689 Other abnormalities of gait and mobility: Secondary | ICD-10-CM | POA: Diagnosis present

## 2019-07-20 DIAGNOSIS — Z79899 Other long term (current) drug therapy: Secondary | ICD-10-CM

## 2019-07-20 DIAGNOSIS — Z9081 Acquired absence of spleen: Secondary | ICD-10-CM

## 2019-07-20 DIAGNOSIS — E1169 Type 2 diabetes mellitus with other specified complication: Secondary | ICD-10-CM | POA: Diagnosis present

## 2019-07-20 DIAGNOSIS — G8929 Other chronic pain: Secondary | ICD-10-CM | POA: Diagnosis present

## 2019-07-20 DIAGNOSIS — F331 Major depressive disorder, recurrent, moderate: Secondary | ICD-10-CM | POA: Diagnosis present

## 2019-07-20 DIAGNOSIS — I10 Essential (primary) hypertension: Secondary | ICD-10-CM | POA: Diagnosis not present

## 2019-07-20 DIAGNOSIS — K529 Noninfective gastroenteritis and colitis, unspecified: Secondary | ICD-10-CM | POA: Diagnosis present

## 2019-07-20 DIAGNOSIS — H9191 Unspecified hearing loss, right ear: Secondary | ICD-10-CM | POA: Diagnosis present

## 2019-07-20 DIAGNOSIS — F17210 Nicotine dependence, cigarettes, uncomplicated: Secondary | ICD-10-CM | POA: Diagnosis present

## 2019-07-20 DIAGNOSIS — Z794 Long term (current) use of insulin: Secondary | ICD-10-CM | POA: Diagnosis not present

## 2019-07-20 DIAGNOSIS — Z90411 Acquired partial absence of pancreas: Secondary | ICD-10-CM

## 2019-07-20 DIAGNOSIS — I152 Hypertension secondary to endocrine disorders: Secondary | ICD-10-CM | POA: Diagnosis present

## 2019-07-20 DIAGNOSIS — F419 Anxiety disorder, unspecified: Secondary | ICD-10-CM | POA: Diagnosis present

## 2019-07-20 DIAGNOSIS — K8689 Other specified diseases of pancreas: Secondary | ICD-10-CM | POA: Diagnosis present

## 2019-07-20 DIAGNOSIS — F0630 Mood disorder due to known physiological condition, unspecified: Secondary | ICD-10-CM | POA: Diagnosis not present

## 2019-07-20 DIAGNOSIS — E1159 Type 2 diabetes mellitus with other circulatory complications: Secondary | ICD-10-CM | POA: Diagnosis present

## 2019-07-20 LAB — GLUCOSE, CAPILLARY
Glucose-Capillary: 291 mg/dL — ABNORMAL HIGH (ref 70–99)
Glucose-Capillary: 358 mg/dL — ABNORMAL HIGH (ref 70–99)
Glucose-Capillary: 190 mg/dL — ABNORMAL HIGH (ref 70–99)

## 2019-07-20 MED ORDER — TRAZODONE HCL 50 MG PO TABS
25.0000 mg | ORAL_TABLET | Freq: Every evening | ORAL | Status: DC | PRN
  Administered 2019-07-22: 50 mg via ORAL
  Administered 2019-07-24: 25 mg via ORAL
  Administered 2019-07-24 – 2019-07-26 (×2): 50 mg via ORAL
  Filled 2019-07-20 (×4): qty 1

## 2019-07-20 MED ORDER — ASPIRIN 81 MG PO CHEW: 81.0000 mg | CHEWABLE_TABLET | Freq: Every day | ORAL | 0 refills | Status: DC

## 2019-07-20 MED ORDER — ENSURE MAX PROTEIN PO LIQD
11.0000 [oz_av] | Freq: Every day | ORAL | Status: DC
  Administered 2019-07-20: 11 [oz_av] via ORAL
  Filled 2019-07-20: qty 330

## 2019-07-20 MED ORDER — MECLIZINE HCL 12.5 MG PO TABS
6.2500 mg | ORAL_TABLET | Freq: Three times a day (TID) | ORAL | Status: DC
  Administered 2019-07-20 – 2019-08-04 (×45): 6.25 mg via ORAL
  Filled 2019-07-20 (×46): qty 0.5

## 2019-07-20 MED ORDER — CYANOCOBALAMIN 1000 MCG/ML IJ SOLN
1000.0000 ug | Freq: Once | INTRAMUSCULAR | Status: AC
  Administered 2019-07-20: 1000 ug via INTRAMUSCULAR
  Filled 2019-07-20: qty 1

## 2019-07-20 MED ORDER — POLYETHYLENE GLYCOL 3350 17 G PO PACK
17.0000 g | PACK | ORAL | Status: DC
  Administered 2019-07-20 – 2019-07-28 (×5): 17 g via ORAL
  Filled 2019-07-20 (×4): qty 1

## 2019-07-20 MED ORDER — ASPIRIN 81 MG PO CHEW
81.0000 mg | CHEWABLE_TABLET | Freq: Every day | ORAL | Status: DC
  Administered 2019-07-21 – 2019-08-04 (×15): 81 mg via ORAL
  Filled 2019-07-20 (×15): qty 1

## 2019-07-20 MED ORDER — BISACODYL 10 MG RE SUPP: 10.0000 mg | Freq: Every day | RECTAL | Status: DC | PRN

## 2019-07-20 MED ORDER — PROCHLORPERAZINE MALEATE 5 MG PO TABS: 5.0000 mg | ORAL_TABLET | Freq: Four times a day (QID) | ORAL | Status: DC | PRN

## 2019-07-20 MED ORDER — ROPINIROLE HCL 1 MG PO TABS
0.5000 mg | ORAL_TABLET | Freq: Every day | ORAL | Status: DC
  Administered 2019-07-20 – 2019-07-22 (×3): 0.5 mg via ORAL
  Filled 2019-07-20 (×4): qty 1

## 2019-07-20 MED ORDER — TROLAMINE SALICYLATE 10 % EX CREA: TOPICAL_CREAM | Freq: Two times a day (BID) | CUTANEOUS | Status: DC | PRN

## 2019-07-20 MED ORDER — GABAPENTIN 400 MG PO CAPS
1200.0000 mg | ORAL_CAPSULE | Freq: Every day | ORAL | Status: DC
  Administered 2019-07-20 – 2019-07-22 (×3): 1200 mg via ORAL
  Filled 2019-07-20 (×3): qty 3

## 2019-07-20 MED ORDER — HYDROXYCHLOROQUINE SULFATE 200 MG PO TABS
200.0000 mg | ORAL_TABLET | Freq: Every day | ORAL | Status: DC
  Administered 2019-07-20 – 2019-08-04 (×16): 200 mg via ORAL
  Filled 2019-07-20 (×16): qty 1

## 2019-07-20 MED ORDER — INSULIN ASPART 100 UNIT/ML ~~LOC~~ SOLN
0.0000 [IU] | Freq: Three times a day (TID) | SUBCUTANEOUS | Status: DC
  Administered 2019-07-20: 9 [IU] via SUBCUTANEOUS
  Administered 2019-07-21: 5 [IU] via SUBCUTANEOUS
  Administered 2019-07-21: 1 [IU] via SUBCUTANEOUS
  Administered 2019-07-22: 3 [IU] via SUBCUTANEOUS
  Administered 2019-07-22: 2 [IU] via SUBCUTANEOUS
  Administered 2019-07-22 – 2019-07-23 (×2): 3 [IU] via SUBCUTANEOUS
  Administered 2019-07-23: 1 [IU] via SUBCUTANEOUS
  Administered 2019-07-23 – 2019-07-24 (×2): 7 [IU] via SUBCUTANEOUS
  Administered 2019-07-24: 5 [IU] via SUBCUTANEOUS
  Administered 2019-07-25 (×2): 1 [IU] via SUBCUTANEOUS
  Administered 2019-07-25: 5 [IU] via SUBCUTANEOUS
  Administered 2019-07-26: 3 [IU] via SUBCUTANEOUS
  Administered 2019-07-26: 1 [IU] via SUBCUTANEOUS
  Administered 2019-07-27: 5 [IU] via SUBCUTANEOUS
  Administered 2019-07-27: 9 [IU] via SUBCUTANEOUS
  Administered 2019-07-27 – 2019-07-28 (×4): 1 [IU] via SUBCUTANEOUS
  Administered 2019-07-29: 2 [IU] via SUBCUTANEOUS
  Administered 2019-07-30: 9 [IU] via SUBCUTANEOUS
  Administered 2019-07-30: 1 [IU] via SUBCUTANEOUS

## 2019-07-20 MED ORDER — HYDROXYZINE HCL 10 MG PO TABS
10.0000 mg | ORAL_TABLET | Freq: Two times a day (BID) | ORAL | Status: DC | PRN
  Administered 2019-07-31: 10 mg via ORAL
  Filled 2019-07-20 (×4): qty 1

## 2019-07-20 MED ORDER — HYDROCODONE-ACETAMINOPHEN 5-325 MG PO TABS
1.0000 | ORAL_TABLET | ORAL | Status: DC | PRN
  Administered 2019-07-20: 2 via ORAL
  Administered 2019-07-20: 1 via ORAL
  Administered 2019-07-21: 2 via ORAL
  Administered 2019-07-21 (×2): 1 via ORAL
  Administered 2019-07-22 – 2019-07-23 (×3): 2 via ORAL
  Administered 2019-07-25: 1 via ORAL
  Administered 2019-07-25 – 2019-07-27 (×2): 2 via ORAL
  Administered 2019-07-28 – 2019-07-31 (×7): 1 via ORAL
  Administered 2019-07-31 – 2019-08-02 (×2): 2 via ORAL
  Filled 2019-07-20: qty 1
  Filled 2019-07-20: qty 2
  Filled 2019-07-20 (×3): qty 1
  Filled 2019-07-20 (×2): qty 2
  Filled 2019-07-20: qty 1
  Filled 2019-07-20 (×3): qty 2
  Filled 2019-07-20: qty 1
  Filled 2019-07-20: qty 2
  Filled 2019-07-20: qty 1
  Filled 2019-07-20: qty 2
  Filled 2019-07-20: qty 1
  Filled 2019-07-20 (×2): qty 2
  Filled 2019-07-20: qty 1
  Filled 2019-07-20 (×2): qty 2

## 2019-07-20 MED ORDER — GUAIFENESIN-DM 100-10 MG/5ML PO SYRP: 5.0000 mL | ORAL_SOLUTION | Freq: Four times a day (QID) | ORAL | Status: DC | PRN

## 2019-07-20 MED ORDER — DIPHENHYDRAMINE HCL 12.5 MG/5ML PO ELIX: 12.5000 mg | ORAL_SOLUTION | Freq: Four times a day (QID) | ORAL | Status: DC | PRN

## 2019-07-20 MED ORDER — ADULT MULTIVITAMIN W/MINERALS CH
1.0000 | ORAL_TABLET | Freq: Every day | ORAL | Status: DC
  Administered 2019-07-21 – 2019-08-04 (×15): 1 via ORAL
  Filled 2019-07-20 (×15): qty 1

## 2019-07-20 MED ORDER — NICOTINE POLACRILEX 2 MG MT GUM
2.0000 mg | CHEWING_GUM | OROMUCOSAL | Status: DC | PRN
  Administered 2019-07-20: 2 mg via ORAL
  Filled 2019-07-20 (×2): qty 1

## 2019-07-20 MED ORDER — PROCHLORPERAZINE 25 MG RE SUPP: 12.5000 mg | Freq: Four times a day (QID) | RECTAL | Status: DC | PRN

## 2019-07-20 MED ORDER — POLYETHYLENE GLYCOL 3350 17 G PO PACK: 17.0000 g | PACK | Freq: Every day | ORAL | Status: DC | PRN

## 2019-07-20 MED ORDER — INSULIN ASPART 100 UNIT/ML ~~LOC~~ SOLN
0.0000 [IU] | Freq: Every day | SUBCUTANEOUS | Status: DC
  Administered 2019-07-21 – 2019-07-24 (×2): 3 [IU] via SUBCUTANEOUS
  Administered 2019-07-25: 4 [IU] via SUBCUTANEOUS
  Administered 2019-07-26: 5 [IU] via SUBCUTANEOUS

## 2019-07-20 MED ORDER — BENAZEPRIL HCL 20 MG PO TABS
20.0000 mg | ORAL_TABLET | Freq: Every day | ORAL | Status: DC
  Administered 2019-07-21 – 2019-07-29 (×9): 20 mg via ORAL
  Filled 2019-07-20 (×9): qty 1

## 2019-07-20 MED ORDER — ALUM & MAG HYDROXIDE-SIMETH 200-200-20 MG/5ML PO SUSP: 30.0000 mL | ORAL | Status: DC | PRN

## 2019-07-20 MED ORDER — INSULIN ASPART PROT & ASPART (70-30 MIX) 100 UNIT/ML ~~LOC~~ SUSP
18.0000 [IU] | Freq: Two times a day (BID) | SUBCUTANEOUS | Status: DC
  Administered 2019-07-20 – 2019-07-22 (×4): 18 [IU] via SUBCUTANEOUS
  Filled 2019-07-20: qty 10

## 2019-07-20 MED ORDER — ATORVASTATIN CALCIUM 80 MG PO TABS
80.0000 mg | ORAL_TABLET | Freq: Every day | ORAL | Status: DC
  Administered 2019-07-21 – 2019-08-04 (×15): 80 mg via ORAL
  Filled 2019-07-20 (×15): qty 1

## 2019-07-20 MED ORDER — MUSCLE RUB 10-15 % EX CREA
TOPICAL_CREAM | Freq: Two times a day (BID) | CUTANEOUS | Status: DC | PRN
  Filled 2019-07-20: qty 85

## 2019-07-20 MED ORDER — METFORMIN HCL 500 MG PO TABS: 500.0000 mg | ORAL_TABLET | Freq: Every day | ORAL | Status: DC

## 2019-07-20 MED ORDER — CYCLOBENZAPRINE HCL 5 MG PO TABS
5.0000 mg | ORAL_TABLET | Freq: Three times a day (TID) | ORAL | Status: DC | PRN
  Administered 2019-07-25: 5 mg via ORAL
  Filled 2019-07-20: qty 1

## 2019-07-20 MED ORDER — FOLIC ACID 1 MG PO TABS
1.0000 mg | ORAL_TABLET | Freq: Every day | ORAL | Status: DC
  Administered 2019-07-20 – 2019-08-04 (×16): 1 mg via ORAL
  Filled 2019-07-20 (×16): qty 1

## 2019-07-20 MED ORDER — ACETAMINOPHEN 325 MG PO TABS
325.0000 mg | ORAL_TABLET | ORAL | Status: DC | PRN
  Administered 2019-07-22 – 2019-08-04 (×8): 650 mg via ORAL
  Filled 2019-07-20 (×10): qty 2

## 2019-07-20 MED ORDER — HYDROXYZINE HCL 10 MG PO TABS: 10.0000 mg | ORAL_TABLET | Freq: Two times a day (BID) | ORAL | Status: DC

## 2019-07-20 MED ORDER — PROCHLORPERAZINE EDISYLATE 10 MG/2ML IJ SOLN
5.0000 mg | Freq: Four times a day (QID) | INTRAMUSCULAR | Status: DC | PRN
  Administered 2019-07-21: 10 mg via INTRAMUSCULAR
  Filled 2019-07-20: qty 2

## 2019-07-20 MED ORDER — BUPROPION HCL ER (XL) 300 MG PO TB24
300.0000 mg | ORAL_TABLET | Freq: Every day | ORAL | Status: DC
  Administered 2019-07-21 – 2019-07-31 (×11): 300 mg via ORAL
  Filled 2019-07-20 (×11): qty 1

## 2019-07-20 MED ORDER — FLEET ENEMA 7-19 GM/118ML RE ENEM: 1.0000 | ENEMA | Freq: Once | RECTAL | Status: DC | PRN

## 2019-07-20 MED ORDER — ENOXAPARIN SODIUM 40 MG/0.4ML ~~LOC~~ SOLN
40.0000 mg | Freq: Every day | SUBCUTANEOUS | Status: DC
  Administered 2019-07-20 – 2019-08-01 (×13): 40 mg via SUBCUTANEOUS
  Filled 2019-07-20 (×13): qty 0.4

## 2019-07-20 NOTE — PMR Pre-admission (Signed)
PMR Admission Coordinator Pre-Admission Assessment  [name] is an 59 y.o., female MRN: [id number] DOB: 1961/02/03 Height: 5' (152.4 cm) Weight: 47.3 kg  Insurance Information HMO:     PPO:      PCP:      IPA:      80/20:      OTHER:  PRIMARY: Medicare a and b      Policy#: [id number]      Subscriber: pt Benefits:  Phone #: passport one online     Name: 07/20/19 Eff. Date: a 01/12/2009 and b 08/13/2010     Deduct: $1484      Out of Pocket Max: none      Life Max: none CIR: 100%      SNF: 20 full Outpatient: 80%     Co-Pay: 205 Home Health: 100%      Co-Pay: none DME: 80%     Co-Pay: 20% Providers: pt choice  SECONDARY: none      Policy#:      Phone#:   Development worker, community:       Phone#:   The Engineer, [employer]" for patients in Inpatient Rehabilitation Facilities with attached "Privacy Act [place] Records" was provided and verbally reviewed with: Patient  Emergency Contact Information Contact Information    Name Relation Home Work [place], [place] Daughter   [phone]   Jefferey Pica Mother   [phone]      Current Medical History  Patient Admitting Diagnosis: CVA  History of Present Illness: 59 year old female with medical history significant for 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 Presented on 07/12/2019 with a 3 day history of nausea and vomiting, high blood sugars, malaise, headache and weakness. Found to be in DKA with blood sugar 406 and anion gap of 25 . CT head performed with evidence of acute infarct. Neurology consulted.  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   Carotid ultrasound without significant occlusion. On 6/2 had worsening N/V. Repeat CT scan showed left cerebellar stroke with worsening edema and mild midline shift.  Neurology felt unlikely deterioration at that point. Still complains of headache on right side and loss of hearing to right side but nausea improved. Repeat CT scan 6/5 showed left large infarct with edema, not worsened since previous. Neurology felt likely small vessel disease due to poorly controlled risk factors. Patient on no antiplatelet pta. Hgb A1c 13.5. Gave 2 G Mg sulfate, toradol, and Norco. On ASA. Patient very emotional, which she states is her baseline.    Patient with labile glucose. Insulin changed to Lantus which is reduced from her 70/30 dose used pta. Continue sliding scale insulin.  Lovenox for DVT prophylaxis.    Complete NIHSS TOTAL: 0  Patient's medical record from [facility]  has been reviewed by the rehabilitation admission coordinator and physician.  Past Medical History  Past Medical History:  Diagnosis Date  . Cancer ([place])   . Collagen vascular disease ([place])   . Diabetes mellitus without complication ([place])   . Hypertension   . Lupus ([place])     Family History   family history is not on file.  Prior Rehab/Hospitalizations Has the patient had prior rehab or hospitalizations prior to admission? Yes  Has the patient had major surgery during 100 days prior to admission? No   Current  Medications  Current Facility-Administered Medications:  .  acetaminophen (TYLENOL) tablet 650 mg, 650 mg, Oral, Q6H PRN, 650 mg at 07/19/19 0806 **OR** acetaminophen (TYLENOL) suppository 650 mg, 650 mg, Rectal, Q6H PRN, Judd Gaudier V, MD .  aspirin chewable tablet 81 mg, 81 mg, Oral, Daily, Amery, Sahar, MD, 81 mg at 07/19/19 1150 .  atorvastatin (LIPITOR) tablet 80 mg, 80 mg, Oral, Daily, Sharen Hones, MD, 80 mg at 07/19/19 1149 .  benazepril (LOTENSIN) tablet 20 mg, 20 mg, Oral, Daily, Kurtis Bushman, Sahar, MD, 20 mg at 07/19/19 1149 .  buPROPion (WELLBUTRIN XL) 24 hr tablet 300 mg, 300 mg, Oral, Daily, Sharen Hones, MD, 300 mg at 07/19/19 1150 .  enoxaparin (LOVENOX)  injection 40 mg, 40 mg, Subcutaneous, Q24H, Sharen Hones, MD, 40 mg at 07/19/19 2053 .  feeding supplement (GLUCERNA SHAKE) (GLUCERNA SHAKE) liquid 237 mL, 237 mL, Oral, TID BM, Nolberto Hanlon, MD, 237 mL at 07/19/19 2053 .  gabapentin (NEURONTIN) capsule 1,200 mg, 1,200 mg, Oral, QHS, Athena Masse, MD, 1,200 mg at 07/19/19 2052 .  HYDROcodone-acetaminophen (NORCO/VICODIN) 5-325 MG per tablet 1-2 tablet, 1-2 tablet, Oral, Q4H PRN, Sharen Hones, MD, 2 tablet at 07/20/19 0020 .  insulin aspart (novoLOG) injection 0-5 Units, 0-5 Units, Subcutaneous, QHS, Athena Masse, MD, 4 Units at 07/19/19 2053 .  insulin aspart (novoLOG) injection 0-9 Units, 0-9 Units, Subcutaneous, TID WC, Athena Masse, MD, 2 Units at 07/19/19 1723 .  insulin glargine (LANTUS) injection 28 Units, 28 Units, Subcutaneous, QHS, Sharen Hones, MD, 28 Units at 07/19/19 2052 .  ketorolac (TORADOL) 15 MG/ML injection 15 mg, 15 mg, Intravenous, Q6H PRN, Sharion Settler, NP, 15 mg at 07/18/19 2141 .  labetalol (NORMODYNE) injection 10 mg, 10 mg, Intravenous, Q4H PRN, Lang Snow, NP, 10 mg at 07/14/19 2020 .  meclizine (ANTIVERT) tablet 6.25 mg, 6.25 mg, Oral, TID, Nolberto Hanlon, MD, 6.25 mg at 07/19/19 2052 .  multivitamin with minerals tablet 1 tablet, 1 tablet, Oral, Daily, Nolberto Hanlon, MD, 1 tablet at 07/19/19 1148 .  ondansetron (ZOFRAN) tablet 4 mg, 4 mg, Oral, Q6H PRN **OR** ondansetron (ZOFRAN) injection 4 mg, 4 mg, Intravenous, Q6H PRN, Athena Masse, MD, 4 mg at 07/18/19 2028  Patients Current Diet:  Diet Order            Diet - low sodium heart healthy        Diet Carb Modified Fluid consistency: Thin; Room service appropriate? Yes  Diet effective now              Precautions / Restrictions Precautions Precautions: Fall Restrictions Weight Bearing Restrictions: No   Has the patient had 2 or more falls or a fall with injury in the past year? No  Prior Activity Level Community (5-7x/wk):  Independent; driving  Prior Functional Level Self Care: Did the patient need help bathing, dressing, using the toilet or eating? Independent  Indoor Mobility: Did the patient need assistance with walking from room to room (with or without device)? Independent  Stairs: Did the patient need assistance with internal or external stairs (with or without device)? Independent  Functional Cognition: Did the patient need help planning regular tasks such as shopping or remembering to take medications? Independent  Home Assistive Devices / Equipment Home Assistive Devices/Equipment: None Home Equipment: None  Prior Device Use: Indicate devices/aids used by the patient prior to current illness, exacerbation or injury? None of the above  Current Functional Level Cognition  Overall Cognitive Status: Within Functional Limits  for tasks assessed Orientation Level: Oriented X4 General Comments: tearful at times.  encouragement given.  Voiced frustration over daughters dependance on her and feeling that she needs to get home to help.    Extremity Assessment (includes Sensation/Coordination)  Upper Extremity Assessment: Overall WFL for tasks assessed, Generalized weakness(complains of L fingers numb, grossly 4/5 bilat)  Lower Extremity Assessment: Generalized weakness(B LE grossly 4/5)    ADLs  Overall ADL's : Needs assistance/impaired General ADL Comments: Pt requires setup-min A for seated upper body ADLs including grooming and feeding.  Pt requires mod-max assist for lower body dressing, bathing, toileting, and functional mobility.  OTR provided setup assist for pt to brush teeth seated EOB while PT provided min A for seated balance.    Mobility  Overal bed mobility: Needs Assistance Bed Mobility: Supine to Sit Rolling: Min assist Supine to sit: HOB elevated, Min assist Sit to supine: Min assist General bed mobility comments: Min assist to progress from long sitting to short sitting. sat EOB x  several minutes. pt very tearful but after several minutes does agree to OOB to reliner.     Transfers  Overall transfer level: Needs assistance Equipment used: Rolling walker (2 wheeled) Transfers: Sit to/from Stand Sit to Stand: Min assist General transfer comment: Min assis for safety to STS from EOB. performed STS several times EOB prior to ambulation ~ 10 ft to recliner. pt was able to progress transfers from min assist to CGA with vcs for proper technique/sequencing    Ambulation / Gait / Stairs / Wheelchair Mobility  Ambulation/Gait Ambulation/Gait assistance: Herbalist (Feet): 10 Feet Assistive device: Rolling walker (2 wheeled) Gait Pattern/deviations: Step-through pattern General Gait Details: Very slow deliberate steps with excellent effort.  No LOB or buckling.  HR 110'120's during gait. Gait velocity: decreased    Posture / Balance Dynamic Sitting Balance Sitting balance - Comments: pt was able to maintain balance while at EOB with feet supported on floor + CGA for safety. pt very tearful while sitting EOB Balance Overall balance assessment: Needs assistance, History of Falls Sitting-balance support: Feet supported Sitting balance-Leahy Scale: Fair Sitting balance - Comments: pt was able to maintain balance while at EOB with feet supported on floor + CGA for safety. pt very tearful while sitting EOB Standing balance support: Bilateral upper extremity supported Standing balance-Leahy Scale: Fair Standing balance comment: needs B hands on RW for support    Special needs/care consideration Hgb A1c 13.5  Designated visitors are Janalee Dane and boyfriend Lanny Hurst. Daughter [name] will not visit for she has no caregiver for her 68 year old daughter, Vida Rigger   Previous Home Environment  Living Arrangements: [name], 44 year old daughter and 40 yo granddaughter, Nutritional therapist)  Lives With: (daughter and grand daughter live with her) Available Help at Discharge: Family,  Available 24 hours/day(daughter unemployed) Type of Home: House Home Layout: One level Home Access: Stairs to enter Entrance Stairs-Rails: Can reach both Entrance Stairs-Number of Steps: 2-3 steps Bathroom Shower/Tub: Gaffer, Charity fundraiser: Standard Bathroom Accessibility: Yes How Accessible: Accessible via [place]: No  Discharge Living Setting Plans for Discharge Living Setting: Patient's home(daughter and grand daughter live with her) Type of Home at Discharge: House Discharge Home Layout: One level Discharge Home Access: Stairs to enter Entrance Stairs-Rails: Right, Left, Can reach both Entrance Stairs-Number of Steps: 2 front and 2 in back Discharge Bathroom Shower/Tub: Walk-in shower, Door Discharge Bathroom Toilet: Standard Discharge Bathroom Accessibility: Yes How Accessible: Accessible  via walker Does the patient have any problems obtaining your medications?: Yes (Describe)(financial needs)  Social/Family/Support Systems Patient Roles: Parent, Caregiver(helps care for grand daughter) Contact Information: [name] Anticipated Caregiver: [name] Anticipated Caregiver's Contact Information: see above Ability/Limitations of Caregiver: no limitations Caregiver Availability: 24/7 Discharge Plan Discussed with Primary Caregiver: Yes Is Caregiver In Agreement with Plan?: Yes Does Caregiver/Family have Issues with Lodging/Transportation while Pt is in Rehab?: No  Goals Patient/Family Goal for Rehab: Mod I to supervision with PT and OT Expected length of stay: ELOS 7 to 10 days Pt/Family Agrees to Admission and willing to participate: Yes Program Orientation Provided & Reviewed with Pt/Caregiver Including Roles  & Responsibilities: Yes  Decrease burden of Care through IP rehab admission: n/a  Possible need for SNF placement upon discharge: not anticipated  Patient Condition: I have reviewed medical records from [facility], spoken with CM, and  patient. I met with patient at the bedside for inpatient rehabilitation assessment.  Patient will benefit from ongoing PT and OT, can actively participate in 3 hours of therapy a day 5 days of the week, and can make measurable gains during the admission.  Patient will also benefit from the coordinated team approach during an Inpatient Acute Rehabilitation admission.  The patient will receive intensive therapy as well as Rehabilitation physician, nursing, social worker, and care management interventions.  Due to bladder management, bowel management, safety, skin/wound care, disease management, medication administration, pain management and patient education the patient requires 24 hour a day rehabilitation nursing.  The patient is currently overall min to mod asisst with mobility and basic ADLs.  Discharge setting and therapy post discharge at home with home health is anticipated.  Patient has agreed to participate in the Acute Inpatient Rehabilitation Program and will admit today.  Preadmission Screen Completed By:  Cleatrice Burke, 07/20/2019 9:49 AM ______________________________________________________________________   Discussed status with Dr. Naaman Plummer on 07/20/2019 at 0950 and received approval for admission today.  Admission Coordinator:  Cleatrice Burke, RN, time 5053 Date 07/20/2019   Assessment/Plan: Diagnosis:left cerebellar infarct 1. Does the need for close, 24 hr/day Medical supervision in concert with the patient's rehab needs make it unreasonable for this patient to be served in a less intensive setting? Yes 2. Co-Morbidities requiring supervision/potential complications: DM, HTN,  Cervical radiculopathy 3. Due to bladder management, bowel management, safety, skin/wound care, disease management, medication administration, pain management and patient education, does the patient require 24 hr/day rehab nursing? Yes 4. Does the patient require coordinated care of a physician, rehab  nurse, PT, OT, and SLP to address physical and functional deficits in the context of the above medical diagnosis(es)? Yes Addressing deficits in the following areas: balance, endurance, locomotion, strength, transferring, bowel/bladder control, bathing, dressing, feeding, grooming, toileting and psychosocial support 5. Can the patient actively participate in an intensive therapy program of at least 3 hrs of therapy 5 days a week? Yes 6. The potential for patient to make measurable gains while on inpatient rehab is excellent 7. Anticipated functional outcomes upon discharge from inpatient rehab: modified independent and supervision PT, modified independent and supervision OT, n/a SLP 8. Estimated rehab length of stay to reach the above functional goals is: 7-10 days 9. Anticipated discharge destination: Home 10. Overall Rehab/Functional Prognosis: excellent   MD Signature: [name], MD, [facility] 07/20/2019

## 2019-07-20 NOTE — H&P (Signed)
Physical Medicine and Rehabilitation Admission H&P    Chief Complaint  Patient presents with  . Stroke  . Functional deficits    HPI: Brittany [name] is a 59 year old female with history of T2DM, depression, discoid Lupus, tobacco use who was admitted to [facility] on 07/12/19 with 3 day history of N/V and dizziness. She was found to have DKA with BS 406 and was treated with IV insulin and fluid bolus. 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 2D echo with bubble study showed EF 55-60% with mild LVH, moderately elevated pulmonary pressure and was negative for shunt. Stroke felt to be due to small vessel disease and ASA recommended by Dr. Doy Mince.  She developed HA with follow up [place] showing increase in edema and hydrocodone added for pain management. Headaches improving but she continues to have tachycardia as well as dizziness affecting mobility and ADLs. CIR recommended due to functional deficits.    Review of Systems  Constitutional: Negative for chills and malaise/fatigue.  HENT: Positive for hearing loss. Negative for congestion, ear discharge, ear pain and tinnitus.   Eyes: Positive for blurred vision and double vision. Negative for photophobia and pain.  Respiratory: Negative for cough and hemoptysis.   Cardiovascular: Negative for chest pain.  Gastrointestinal: Negative for nausea and vomiting.  Genitourinary: Negative for dysuria.  Musculoskeletal: Positive for neck pain (and left shoulder pain).  Skin: Negative for rash.  Neurological: Positive for dizziness (when up), sensory change (chronic numbness left thumb and 2nd/3rd fingers), focal weakness and headaches.  Psychiatric/Behavioral: Negative for depression. The patient is nervous/anxious and has insomnia.       Past Medical History:  Diagnosis Date  . Cancer ([place])   . Collagen vascular disease ([place])   .  Diabetes mellitus without complication ([place])   . Hypertension   . Lupus [facility])     Past Surgical History:  Procedure Laterality Date  . PANCREATECTOMY    . spleenectomy      Family History  Problem Relation Age of Onset  . Anxiety disorder Sister   . Breast cancer Maternal Aunt      Social History: Widowed. Consulting civil engineer.  reports that she has been smoking- 1/2 PPD x 37 years. Trying to quit.  She has never used smokeless tobacco. She reports current alcohol use--5-6 cans of beer 2-3 times a week. . She reports previous drug use.     Allergies  Allergen Reactions  . Cephalosporins Itching  . Hydromorphone Itching  . Keflin [Cephalothin] Itching    Medications Prior to Admission  Medication Sig Dispense Refill  . aspirin 81 MG chewable tablet Chew 1 tablet (81 mg total) by mouth daily. 30 tablet 0  . atorvastatin (LIPITOR) 80 MG tablet Take 80 mg by mouth Nightly.     . benazepril (LOTENSIN) 10 MG tablet Take 10 mg by mouth daily.  3  . buPROPion (WELLBUTRIN XL) 300 MG 24 hr tablet Take 300 mg by mouth daily.    . folic acid (FOLVITE) 1 MG tablet Take 1 mg by mouth daily.  11  . gabapentin (NEURONTIN) 600 MG tablet Take 1,800 mg by mouth at bedtime.     . hydroxychloroquine (PLAQUENIL) 200 MG tablet Take 200 mg by mouth daily.     . hydrOXYzine (ATARAX/VISTARIL) 10 MG tablet Take 10 mg by mouth 2 (two) times daily.     Marland Kitchen  insulin NPH-regular Human (NOVOLIN 70/30) (70-30) 100 UNIT/ML injection Inject 18 Units into the skin 2 (two) times daily with a meal.     . metFORMIN (GLUCOPHAGE) 1000 MG tablet Take 1,000 mg by mouth daily with breakfast.     . mirtazapine (REMERON) 15 MG tablet Take 15 mg by mouth at bedtime.    . Pancrelipase, Lip-Prot-Amyl, 24000-76000 units CPEP Take 3 capsules by mouth 3 (three) times daily.    Marland Kitchen rOPINIRole (REQUIP) 0.5 MG tablet Take 0.5 mg by mouth at bedtime.      Drug Regimen Review  Drug regimen was reviewed and remains appropriate with no  significant issues identified  Home: Home Living Family/patient expects to be discharged to:: Private residence Living Arrangements: [name], 71 year old daughter and 16 yo granddaughter, Emera li) Available Help at Discharge: Family, Available 24 hours/day(daughter unemployed) Type of Home: House Home Access: Stairs to enter Technical brewer of Steps: 2-3 steps Entrance Stairs-Rails: Can reach both Home Layout: One level Bathroom Shower/Tub: Gaffer, Charity fundraiser: Standard Bathroom Accessibility: Yes Home Equipment: None  Lives With: (daughter and grand daughter live with her)   Functional History: Prior Function Level of Independence: Independent Comments: reports previous independence without AD. No recent falls except one that brought her to ED  Functional Status:  Mobility: Bed Mobility Overal bed mobility: Needs Assistance Bed Mobility: Supine to Sit Rolling: Min assist Supine to sit: HOB elevated, Min assist Sit to supine: Min assist General bed mobility comments: Min assist to progress from long sitting to short sitting. sat EOB x several minutes. pt very tearful but after several minutes does agree to OOB to reliner.  Transfers Overall transfer level: Needs assistance Equipment used: Rolling walker (2 wheeled) Transfers: Sit to/from Stand Sit to Stand: Min assist General transfer comment: Min assis for safety to STS from EOB. performed STS several times EOB prior to ambulation ~ 10 ft to recliner. pt was able to progress transfers from min assist to CGA with vcs for proper technique/sequencing Ambulation/Gait Ambulation/Gait assistance: Min assist Gait Distance (Feet): 10 Feet Assistive device: Rolling walker (2 wheeled) Gait Pattern/deviations: Step-through pattern General Gait Details: Very slow deliberate steps with excellent effort.  No LOB or buckling.  HR 110'120's during gait. Gait velocity: decreased    ADL: ADL Overall ADL's :  Needs assistance/impaired General ADL Comments: Pt requires setup-min A for seated upper body ADLs including grooming and feeding.  Pt requires mod-max assist for lower body dressing, bathing, toileting, and functional mobility.  OTR provided setup assist for pt to brush teeth seated EOB while PT provided min A for seated balance.  Cognition: Cognition Overall Cognitive Status: Within Functional Limits for tasks assessed Orientation Level: Oriented X4 Cognition Arousal/Alertness: Awake/alert Behavior During Therapy: WFL for tasks assessed/performed Overall Cognitive Status: Within Functional Limits for tasks assessed General Comments: tearful at times.  encouragement given.  Voiced frustration over daughters dependance on her and feeling that she needs to get home to help.   Blood pressure (!) 151/116, pulse 85, temperature 97.9 F (36.6 C), temperature source Oral, resp. rate 16, height 5' (1.524 m), weight 47.3 kg, SpO2 100 %. Physical Exam  Constitutional: She is oriented to person, place, and time. No distress.  Petite lady, well dressed with jewelry, nails done  HENT:  Head: Normocephalic and atraumatic.  Eyes: Pupils are equal, round, and reactive to light. EOM are normal.  Neck: No thyromegaly present.  Cardiovascular: Normal rate and regular rhythm. Exam reveals no friction  rub.  No murmur heard. Respiratory: Effort normal. No respiratory distress. She has no wheezes.  GI: Soft. She exhibits no distension. There is no abdominal tenderness.  Musculoskeletal:        General: No deformity or edema.     Cervical back: Normal range of motion.     Comments: Pain along right trap, SCM, cervical paraspinals with palpation and  with left lateral bending of head/neck as well with leftward rotation of the head.   Neurological: She is alert and oriented to person, place, and time. She has normal reflexes.  2-3 beats of nystagmus with lateral gaze. No diplopia, gaze appears conjugate.  Decreased FTN, depth perception difficulties, L>R. Truncal ataxia with standing, attempts to transfer from chair to bed. Motor nearly 4+/5 in all 4's. No sensory deficits. Good insight and awareness. Functional memory  Skin: No rash noted. She is not diaphoretic. No erythema.  Psychiatric: She has a normal mood and affect. Her behavior is normal.    Results for orders placed or performed during the hospital encounter of 07/12/19 (from the past 48 hour(s))  Glucose, capillary     Status: Abnormal   Collection Time: 07/18/19  4:24 PM  Result Value Ref Range   Glucose-Capillary 316 (H) 70 - 99 mg/dL    Comment: Glucose reference range applies only to samples taken after fasting for at least 8 hours.  Glucose, capillary     Status: Abnormal   Collection Time: 07/18/19  9:31 PM  Result Value Ref Range   Glucose-Capillary 101 (H) 70 - 99 mg/dL    Comment: Glucose reference range applies only to samples taken after fasting for at least 8 hours.  Glucose, capillary     Status: Abnormal   Collection Time: 07/19/19  7:34 AM  Result Value Ref Range   Glucose-Capillary 330 (H) 70 - 99 mg/dL    Comment: Glucose reference range applies only to samples taken after fasting for at least 8 hours.  Glucose, capillary     Status: Abnormal   Collection Time: 07/19/19 11:26 AM  Result Value Ref Range   Glucose-Capillary 324 (H) 70 - 99 mg/dL    Comment: Glucose reference range applies only to samples taken after fasting for at least 8 hours.  Glucose, capillary     Status: Abnormal   Collection Time: 07/19/19  4:49 PM  Result Value Ref Range   Glucose-Capillary 157 (H) 70 - 99 mg/dL    Comment: Glucose reference range applies only to samples taken after fasting for at least 8 hours.  Glucose, capillary     Status: Abnormal   Collection Time: 07/19/19  8:42 PM  Result Value Ref Range   Glucose-Capillary 321 (H) 70 - 99 mg/dL    Comment: Glucose reference range applies only to samples taken after fasting  for at least 8 hours.  Glucose, capillary     Status: Abnormal   Collection Time: 07/20/19  7:57 AM  Result Value Ref Range   Glucose-Capillary 291 (H) 70 - 99 mg/dL    Comment: Glucose reference range applies only to samples taken after fasting for at least 8 hours.   No results found.     Medical Problem List and Plan: 1.  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  Antithrombotics: -DVT/anticoagulation:  Pharmaceutical: Lovenox  -antiplatelet therapy: On low dose ASA 3. C6/C7 radiculopathy/Pain Management: s/p ESI- 3/23. Pt with  notable multifactorial spondylosis in mid to lower cervical spine  -add heating pad.   -utilize muscle relaxant,  Muscle balm  -ask therapy to address posture, ROM  -Will also continue Hydrocodone prn.  4. Mood: LCSW to follow for evaluation and support.   -antipsychotic agents: N/A 5. Neuropsych: This patient is capable of making decisions on her own behalf. 6. Skin/Wound Care: Routine pressure relief measures.  7. Fluids/Electrolytes/Nutrition: Monitor I/O. Check lytes in am.  8. HTN: Monitor BP tid--on Lisinopril with BP still poorly controlled. SBP >100's. Will likely need titration of meds.   9. T2DM post pancreatectomy: Poorly controlled with Hgb A1C- 12.5. Monitor BS ac/hs--poor controlled. Resume 70/30 insulin. Was not using metformin PTA. Will d/c Glucerna between meals as likely affecting BS and change to Ensure Max which has 5g net carbs.  10 Collagen Vascular disease/discoid Lupus: Resume Plaquenil and folic acid.  11. Pancreatic insufficiency/S/p Pancreatectomy: Resume pancrease tid ac. Will resume psyllium and loperemide prn for chronic diarrhea.  12. Dyslipidemia: On high dose statin.  13. Dizziness: Continue low dose antivert. May need vestibular evaluation.  51. H/o Major depressive disorder: Continue Wellbutrin--resume atarax bid prn  (for anxiety). Has not been using Remeron.   15. Tobacco abuse: Willing to try nicotine gum--will order.  16. Vitamin B 12 deficiency: Gets IM injection every month.      Bary Leriche, PA-C 07/20/2019

## 2019-07-20 NOTE — Progress Notes (Signed)
Inpatient Rehabilitation Medication Review by a Pharmacist  A complete drug regimen review was completed for this patient to identify any potential clinically significant medication issues.  Clinically significant medication issues were identified:  no   Type of Medication Issue Identified Description of Issue Plan   Drug Interaction(s) (clinically significant)      Duplicate Therapy      Allergy      No Medication Administration End Date      Incorrect Dose      Additional Drug Therapy Needed      Other  Held home meloxicam, mirtazapine and pancrelipase 24000-76000 3 caps TID since admission to Hospitalist service  Consider restarting pancrelipase as patient eats more regularly     Time spent performing this drug regimen review (minutes):  [place], PharmD, BCPS, [facility] Clinical Pharmacist  Please check AMION for all [place] phone numbers After 10:00 PM, call [place]

## 2019-07-20 NOTE — TOC Transition Note (Signed)
Transition of Care [facility]) - CM/SW Discharge Note   Patient Details  Name: LENNYN GANGE MRN: [id number] Date of Birth: [birth year]  Transition of Care [facility]) CM/SW Contact:  Shelbie Hutching, RN Phone Number: 07/20/2019, 10:53 AM   Clinical Narrative:    Patient is medically stable for discharge to [place].  Patient will discharge today, bedside RN to call report.  [facility] will provide transport.    Final next level of care: IP Rehab Facility Barriers to Discharge: Barriers Resolved   Patient Goals and CMS Choice Patient states their goals for this hospitalization and ongoing recovery are:: just wants to feel better CMS Medicare.gov Compare Post Acute Care list provided to:: Patient Choice offered to / list presented to : Patient  Discharge Placement              Patient chooses bed at: [facility]) Patient to be transferred to facility by: [place] Name of family member notified: Patient will notify daughter Patient and family notified of of transfer: 07/20/19  Discharge Plan and Services   Discharge Planning Services: CM Consult Post Acute Care Choice: [place]: PT, RN, OT [facility] Agency: [place] ([place]) Date [facility] Agency Contacted: 07/14/19 Time HH Agency Contacted: 1200 Representative spoke with at [place]: [place] ([place]) Interventions     Readmission Risk Interventions No flowsheet data found.

## 2019-07-20 NOTE — Progress Notes (Signed)
[name], MD  Physician  Physical Medicine and Rehabilitation  PMR Pre-admission      Signed  Date of Service:  07/20/2019  8:38 AM      Related encounter: ED to Hosp-Admission (Discharged) from 07/12/2019 in [facility] (1C)      Signed        Show:Clear all '[x]' Manual'[x]' Template'[]' Copied  Added by: '[x]' Jalaysha Skilton, Vertis Kelch, RN'[x]' [name], MD  '[]' Hover for details PMR Admission Coordinator Pre-Admission Assessment   [name] is an 59 y.o., female MRN: [id number] DOB: 1960-03-19 Height: 5' (152.4 cm) Weight: 47.3 kg   Insurance Information HMO:     PPO:      PCP:      IPA:      80/20:      OTHER:  PRIMARY: Medicare a and b      Policy#: [id number]      Subscriber: pt Benefits:  Phone #: passport one online     Name: 07/20/19 Eff. Date: a 01/12/2009 and b 08/13/2010     Deduct: $1484      Out of Pocket Max: none      Life Max: none CIR: 100%      SNF: 20 full Outpatient: 80%     Co-Pay: 205 Home Health: 100%      Co-Pay: none DME: 80%     Co-Pay: 20% Providers: pt choice  SECONDARY: none      Policy#:      Phone#:    Development worker, community:       Phone#:    [employer] Engineer, [employer]" for patients in Inpatient Rehabilitation Facilities with attached "Privacy Act [place] Records" was provided and verbally reviewed with: Patient   Emergency Contact Information         Contact Information     Name Relation Home Work Brittany [name] Daughter     [phone]    Brittany [name] Mother     [phone]         Current Medical History  Patient Admitting Diagnosis: CVA   History of Present Illness: 59 year old female with medical history significant for 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 Presented on 07/12/2019 with a 3 day  history of nausea and vomiting, high blood sugars, malaise, headache and weakness. Found to be in DKA with blood sugar 406 and anion gap of 25 . CT head performed with evidence of acute infarct. Neurology consulted.  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    Carotid ultrasound without significant occlusion. On 6/2 had worsening N/V. Repeat CT scan showed left cerebellar stroke with worsening edema and mild midline shift. Neurology felt unlikely deterioration at that point. Still complains of headache on right side and loss of hearing to right side but nausea improved. Repeat CT scan 6/5 showed left large infarct with edema, not worsened since previous. Neurology felt likely small vessel disease due to poorly controlled risk factors. Patient on no antiplatelet pta. Hgb A1c 13.5. Gave 2 G Mg sulfate, toradol, and Norco. On ASA. Patient very emotional, which she states is her baseline.     Patient with labile glucose. Insulin changed to Lantus which is reduced from her 70/30 dose used pta. Continue sliding scale insulin.  Lovenox for DVT prophylaxis.      Complete  NIHSS TOTAL: 0   Patient's medical record from [facility]  has been reviewed by the rehabilitation admission coordinator and physician.   Past Medical History      Past Medical History:  Diagnosis Date  . Cancer ([place])    . Collagen vascular disease ([place])    . Diabetes mellitus without complication ([place])    . Hypertension    . Lupus ([place])        Family History   family history is not on file.   Prior Rehab/Hospitalizations Has the patient had prior rehab or hospitalizations prior to admission? Yes   Has the patient had major surgery during 100 days prior to admission? No               Current Medications   Current Facility-Administered Medications:  .  acetaminophen (TYLENOL) tablet 650 mg, 650 mg, Oral, Q6H PRN, 650 mg at 07/19/19 0806 **OR** acetaminophen (TYLENOL) suppository 650 mg,  650 mg, Rectal, Q6H PRN, Judd Gaudier V, MD .  aspirin chewable tablet 81 mg, 81 mg, Oral, Daily, Amery, Sahar, MD, 81 mg at 07/19/19 1150 .  atorvastatin (LIPITOR) tablet 80 mg, 80 mg, Oral, Daily, Sharen Hones, MD, 80 mg at 07/19/19 1149 .  benazepril (LOTENSIN) tablet 20 mg, 20 mg, Oral, Daily, Kurtis Bushman, Sahar, MD, 20 mg at 07/19/19 1149 .  buPROPion (WELLBUTRIN XL) 24 hr tablet 300 mg, 300 mg, Oral, Daily, Sharen Hones, MD, 300 mg at 07/19/19 1150 .  enoxaparin (LOVENOX) injection 40 mg, 40 mg, Subcutaneous, Q24H, Sharen Hones, MD, 40 mg at 07/19/19 2053 .  feeding supplement (GLUCERNA SHAKE) (GLUCERNA SHAKE) liquid 237 mL, 237 mL, Oral, TID BM, Nolberto Hanlon, MD, 237 mL at 07/19/19 2053 .  gabapentin (NEURONTIN) capsule 1,200 mg, 1,200 mg, Oral, QHS, Athena Masse, MD, 1,200 mg at 07/19/19 2052 .  HYDROcodone-acetaminophen (NORCO/VICODIN) 5-325 MG per tablet 1-2 tablet, 1-2 tablet, Oral, Q4H PRN, Sharen Hones, MD, 2 tablet at 07/20/19 0020 .  insulin aspart (novoLOG) injection 0-5 Units, 0-5 Units, Subcutaneous, QHS, Athena Masse, MD, 4 Units at 07/19/19 2053 .  insulin aspart (novoLOG) injection 0-9 Units, 0-9 Units, Subcutaneous, TID WC, Athena Masse, MD, 2 Units at 07/19/19 1723 .  insulin glargine (LANTUS) injection 28 Units, 28 Units, Subcutaneous, QHS, Sharen Hones, MD, 28 Units at 07/19/19 2052 .  ketorolac (TORADOL) 15 MG/ML injection 15 mg, 15 mg, Intravenous, Q6H PRN, Sharion Settler, NP, 15 mg at 07/18/19 2141 .  labetalol (NORMODYNE) injection 10 mg, 10 mg, Intravenous, Q4H PRN, Lang Snow, NP, 10 mg at 07/14/19 2020 .  meclizine (ANTIVERT) tablet 6.25 mg, 6.25 mg, Oral, TID, Nolberto Hanlon, MD, 6.25 mg at 07/19/19 2052 .  multivitamin with minerals tablet 1 tablet, 1 tablet, Oral, Daily, Nolberto Hanlon, MD, 1 tablet at 07/19/19 1148 .  ondansetron (ZOFRAN) tablet 4 mg, 4 mg, Oral, Q6H PRN **OR** ondansetron (ZOFRAN) injection 4 mg, 4 mg, Intravenous, Q6H PRN, Athena Masse, MD, 4 mg at 07/18/19 2028   Patients Current Diet:     Diet Order                      Diet - low sodium heart healthy           Diet Carb Modified Fluid consistency: Thin; Room service appropriate? Yes  Diet effective now                   Precautions / Restrictions  Precautions Precautions: Fall Restrictions Weight Bearing Restrictions: No    Has the patient had 2 or more falls or a fall with injury in the past year? No   Prior Activity Level Community (5-7x/wk): Independent; driving   Prior Functional Level Self Care: Did the patient need help bathing, dressing, using the toilet or eating? Independent   Indoor Mobility: Did the patient need assistance with walking from room to room (with or without device)? Independent   Stairs: Did the patient need assistance with internal or external stairs (with or without device)? Independent   Functional Cognition: Did the patient need help planning regular tasks such as shopping or remembering to take medications? Independent   Home Assistive Devices / Equipment Home Assistive Devices/Equipment: None Home Equipment: None   Prior Device Use: Indicate devices/aids used by the patient prior to current illness, exacerbation or injury? None of the above   Current Functional Level Cognition   Overall Cognitive Status: Within Functional Limits for tasks assessed Orientation Level: Oriented X4 General Comments: tearful at times.  encouragement given.  Voiced frustration over daughters dependance on her and feeling that she needs to get home to help.    Extremity Assessment (includes Sensation/Coordination)   Upper Extremity Assessment: Overall WFL for tasks assessed, Generalized weakness(complains of L fingers numb, grossly 4/5 bilat)  Lower Extremity Assessment: Generalized weakness(B LE grossly 4/5)     ADLs   Overall ADL's : Needs assistance/impaired General ADL Comments: Pt requires setup-min A for seated upper body  ADLs including grooming and feeding.  Pt requires mod-max assist for lower body dressing, bathing, toileting, and functional mobility.  OTR provided setup assist for pt to brush teeth seated EOB while PT provided min A for seated balance.     Mobility   Overal bed mobility: Needs Assistance Bed Mobility: Supine to Sit Rolling: Min assist Supine to sit: HOB elevated, Min assist Sit to supine: Min assist General bed mobility comments: Min assist to progress from long sitting to short sitting. sat EOB x several minutes. pt very tearful but after several minutes does agree to OOB to reliner.      Transfers   Overall transfer level: Needs assistance Equipment used: Rolling walker (2 wheeled) Transfers: Sit to/from Stand Sit to Stand: Min assist General transfer comment: Min assis for safety to STS from EOB. performed STS several times EOB prior to ambulation ~ 10 ft to recliner. pt was able to progress transfers from min assist to CGA with vcs for proper technique/sequencing     Ambulation / Gait / Stairs / Wheelchair Mobility   Ambulation/Gait Ambulation/Gait assistance: Herbalist (Feet): 10 Feet Assistive device: Rolling walker (2 wheeled) Gait Pattern/deviations: Step-through pattern General Gait Details: Very slow deliberate steps with excellent effort.  No LOB or buckling.  HR 110'120's during gait. Gait velocity: decreased     Posture / Balance Dynamic Sitting Balance Sitting balance - Comments: pt was able to maintain balance while at EOB with feet supported on floor + CGA for safety. pt very tearful while sitting EOB Balance Overall balance assessment: Needs assistance, History of Falls Sitting-balance support: Feet supported Sitting balance-Leahy Scale: Fair Sitting balance - Comments: pt was able to maintain balance while at EOB with feet supported on floor + CGA for safety. pt very tearful while sitting EOB Standing balance support: Bilateral upper extremity  supported Standing balance-Leahy Scale: Fair Standing balance comment: needs B hands on RW for support     Special needs/care consideration Hgb  A1c 13.5  Designated visitors are [employer] and boyfriend Lanny Hurst. Daughter [name] will not visit for she has no caregiver for her 56 year old daughter, Vida Rigger    Previous Home Environment  Living Arrangements: [name], 5 year old daughter and 30 yo granddaughter, Nutritional therapist)  Lives With: (daughter and grand daughter live with her) Available Help at Discharge: Family, Available 24 hours/day(daughter unemployed) Type of Home: [place]: One level Home Access: Stairs to enter Entrance Stairs-Rails: Can reach both Entrance Stairs-Number of Steps: 2-3 steps Bathroom Shower/Tub: Gaffer, Charity fundraiser: Standard Bathroom Accessibility: Yes How Accessible: Accessible via [place]: No   Discharge Living Setting Plans for Discharge Living Setting: Patient's home(daughter and grand daughter live with her) Type of Home at Discharge: House Discharge Home Layout: One level Discharge Home Access: Stairs to enter Entrance Stairs-Rails: Right, Left, Can reach both Entrance Stairs-Number of Steps: 2 front and 2 in back Discharge Bathroom Shower/Tub: Walk-in shower, Door Discharge Bathroom Toilet: Standard Discharge Bathroom Accessibility: Yes How Accessible: Accessible via walker Does the patient have any problems obtaining your medications?: Yes (Describe)(financial needs)   Social/Family/Support Systems Patient Roles: Parent, Caregiver(helps care for grand daughter) Contact Information: [name] Anticipated Caregiver: [name] Anticipated Caregiver's Contact Information: see above Ability/Limitations of Caregiver: no limitations Caregiver Availability: 24/7 Discharge Plan Discussed with Primary Caregiver: Yes Is Caregiver In Agreement with Plan?: Yes Does Caregiver/Family have Issues with Lodging/Transportation  while Pt is in Rehab?: No   Goals Patient/Family Goal for Rehab: Mod I to supervision with PT and OT Expected length of stay: ELOS 7 to 10 days Pt/Family Agrees to Admission and willing to participate: Yes Program Orientation Provided & Reviewed with Pt/Caregiver Including Roles  & Responsibilities: Yes   Decrease burden of Care through IP rehab admission: n/a   Possible need for SNF placement upon discharge: not anticipated   Patient Condition: I have reviewed medical records from [facility], spoken with CM, and patient. I met with patient at the bedside for inpatient rehabilitation assessment.  Patient will benefit from ongoing PT and OT, can actively participate in 3 hours of therapy a day 5 days of the week, and can make measurable gains during the admission.  Patient will also benefit from the coordinated team approach during an Inpatient Acute Rehabilitation admission.  The patient will receive intensive therapy as well as Rehabilitation physician, nursing, social worker, and care management interventions.  Due to bladder management, bowel management, safety, skin/wound care, disease management, medication administration, pain management and patient education the patient requires 24 hour a day rehabilitation nursing.  The patient is currently overall min to mod asisst with mobility and basic ADLs.  Discharge setting and therapy post discharge at home with home health is anticipated.  Patient has agreed to participate in the Acute Inpatient Rehabilitation Program and will admit today.   Preadmission Screen Completed By:  Cleatrice Burke, 07/20/2019 9:49 AM ______________________________________________________________________   Discussed status with Dr. Naaman Plummer on 07/20/2019 at 0950 and received approval for admission today.   Admission Coordinator:  Cleatrice Burke, RN, time 3664 Date 07/20/2019    Assessment/Plan: Diagnosis:left cerebellar infarct 1. Does the need for close, 24 hr/day  Medical supervision in concert with the patient's rehab needs make it unreasonable for this patient to be served in a less intensive setting? Yes 2. Co-Morbidities requiring supervision/potential complications: DM, HTN,  Cervical radiculopathy 3. Due to bladder management, bowel management, safety, skin/wound care, disease management, medication administration, pain management and patient  education, does the patient require 24 hr/day rehab nursing? Yes 4. Does the patient require coordinated care of a physician, rehab nurse, PT, OT, and SLP to address physical and functional deficits in the context of the above medical diagnosis(es)? Yes Addressing deficits in the following areas: balance, endurance, locomotion, strength, transferring, bowel/bladder control, bathing, dressing, feeding, grooming, toileting and psychosocial support 5. Can the patient actively participate in an intensive therapy program of at least 3 hrs of therapy 5 days a week? Yes 6. The potential for patient to make measurable gains while on inpatient rehab is excellent 7. Anticipated functional outcomes upon discharge from inpatient rehab: modified independent and supervision PT, modified independent and supervision OT, n/a SLP 8. Estimated rehab length of stay to reach the above functional goals is: 7-10 days 9. Anticipated discharge destination: Home 10. Overall Rehab/Functional Prognosis: excellent     MD Signature: [name], MD, [place] [facility] 07/20/2019         Revision History

## 2019-07-20 NOTE — Progress Notes (Signed)
   07/20/19 0002  Assess: MEWS Score  ECG Heart Rate (!) 126 (Jareth Pardee rn aware/ Paitnt up to bathroom)  Assess: MEWS Score  MEWS Temp 0  MEWS Systolic 0  MEWS Pulse 2  MEWS RR 0  MEWS LOC 0  MEWS Score 2  MEWS Score Color Yellow  Assess: if the MEWS score is Yellow or Red  Were vital signs taken at a resting state? No  Focused Assessment Documented focused assessment  Early Detection of Sepsis Score *See Row Information* Low  MEWS guidelines implemented *See Row Information* No, vital signs rechecked

## 2019-07-20 NOTE — Progress Notes (Signed)
Pt admitted to 4W17. Pt arrived via [place]. Pt is alert and oriented. Pt has no complaints of pain. Pt has belongings with her. No family at bedside at this time. Call bell is in reach and bed alarm is in place.

## 2019-07-20 NOTE — Progress Notes (Signed)
Inpatient Rehabilitation Admissions Coordinator  I spoke with patient by phone and she is in agreement to admit to CIR at [facility] today. I will arrange [facility] transport this morning. I have updated Dr. Roosevelt Locks, acute team, as well as TOC team, Pryor Montes, RN CM. I will make the arrangements to admit today.  Danne Baxter, RN, MSN Rehab Admissions Coordinator [phone] 07/20/2019 9:57 AM

## 2019-07-20 NOTE — H&P (Signed)
Physical Medicine and Rehabilitation Admission H&P        Chief Complaint  Patient presents with  . Stroke  . Functional deficits      HPI: Brittany [name] is a 59 year old female with history of T2DM, depression, discoid Lupus, tobacco use who was admitted to [facility] on 07/12/19 with 3 day history of N/V and dizziness. She was found to have DKA with BS 406 and was treated with IV insulin and fluid bolus. 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 2D echo with bubble study showed EF 55-60% with mild LVH, moderately elevated pulmonary pressure and was negative for shunt. Stroke felt to be due to small vessel disease and ASA recommended by Dr. Doy Mince.  She developed HA with follow up [place] showing increase in edema and hydrocodone added for pain management. Headaches improving but she continues to have tachycardia as well as dizziness affecting mobility and ADLs. CIR recommended due to functional deficits.      Review of Systems  Constitutional: Negative for chills and malaise/fatigue.  HENT: Positive for hearing loss. Negative for congestion, ear discharge, ear pain and tinnitus.   Eyes: Positive for blurred vision and double vision. Negative for photophobia and pain.  Respiratory: Negative for cough and hemoptysis.   Cardiovascular: Negative for chest pain.  Gastrointestinal: Negative for nausea and vomiting.  Genitourinary: Negative for dysuria.  Musculoskeletal: Positive for neck pain (and left shoulder pain).  Skin: Negative for rash.  Neurological: Positive for dizziness (when up), sensory change (chronic numbness left thumb and 2nd/3rd fingers), focal weakness and headaches.  Psychiatric/Behavioral: Negative for depression. The patient is nervous/anxious and has insomnia.             Past Medical History:  Diagnosis Date  . Cancer ([place])    . Collagen vascular  disease ([place])    . Diabetes mellitus without complication ([place])    . Hypertension    . Lupus [facility])             Past Surgical History:  Procedure Laterality Date  . PANCREATECTOMY      . spleenectomy               Family History  Problem Relation Age of Onset  . Anxiety disorder Sister    . Breast cancer Maternal Aunt        Social History: Widowed. Consulting civil engineer.  reports that she has been smoking- 1/2 PPD x 37 years. Trying to quit.  She has never used smokeless tobacco. She reports current alcohol use--5-6 cans of beer 2-3 times a week. . She reports previous drug use.          Allergies  Allergen Reactions  . Cephalosporins Itching  . Hydromorphone Itching  . Keflin [Cephalothin] Itching            Medications Prior to Admission  Medication Sig Dispense Refill  . aspirin 81 MG chewable tablet Chew 1 tablet (81 mg total) by mouth daily. 30 tablet 0  . atorvastatin (LIPITOR) 80 MG tablet Take 80 mg by mouth Nightly.       . benazepril (LOTENSIN) 10 MG tablet Take 10 mg by mouth daily.   3  . buPROPion (WELLBUTRIN XL) 300 MG 24 hr tablet Take 300 mg by mouth daily.      . folic acid (FOLVITE) 1 MG tablet Take 1  mg by mouth daily.   11  . gabapentin (NEURONTIN) 600 MG tablet Take 1,800 mg by mouth at bedtime.       . hydroxychloroquine (PLAQUENIL) 200 MG tablet Take 200 mg by mouth daily.       . hydrOXYzine (ATARAX/VISTARIL) 10 MG tablet Take 10 mg by mouth 2 (two) times daily.       . insulin NPH-regular Human (NOVOLIN 70/30) (70-30) 100 UNIT/ML injection Inject 18 Units into the skin 2 (two) times daily with a meal.       . metFORMIN (GLUCOPHAGE) 1000 MG tablet Take 1,000 mg by mouth daily with breakfast.       . mirtazapine (REMERON) 15 MG tablet Take 15 mg by mouth at bedtime.      . Pancrelipase, Lip-Prot-Amyl, 24000-76000 units CPEP Take 3 capsules by mouth 3 (three) times daily.      Marland Kitchen rOPINIRole (REQUIP) 0.5 MG tablet Take 0.5 mg by mouth at bedtime.            Drug Regimen Review  Drug regimen was reviewed and remains appropriate with no significant issues identified   Home: Home Living Family/patient expects to be discharged to:: Private residence Living Arrangements: [name], 22 year old daughter and 13 yo granddaughter, Emera li) Available Help at Discharge: Family, Available 24 hours/day(daughter unemployed) Type of Home: House Home Access: Stairs to enter Technical brewer of Steps: 2-3 steps Entrance Stairs-Rails: Can reach both Home Layout: One level Bathroom Shower/Tub: Gaffer, Charity fundraiser: Standard Bathroom Accessibility: Yes Home Equipment: None  Lives With: (daughter and grand daughter live with her)   Functional History: Prior Function Level of Independence: Independent Comments: reports previous independence without AD. No recent falls except one that brought her to ED   Functional Status:  Mobility: Bed Mobility Overal bed mobility: Needs Assistance Bed Mobility: Supine to Sit Rolling: Min assist Supine to sit: HOB elevated, Min assist Sit to supine: Min assist General bed mobility comments: Min assist to progress from long sitting to short sitting. sat EOB x several minutes. pt very tearful but after several minutes does agree to OOB to reliner.  Transfers Overall transfer level: Needs assistance Equipment used: Rolling walker (2 wheeled) Transfers: Sit to/from Stand Sit to Stand: Min assist General transfer comment: Min assis for safety to STS from EOB. performed STS several times EOB prior to ambulation ~ 10 ft to recliner. pt was able to progress transfers from min assist to CGA with vcs for proper technique/sequencing Ambulation/Gait Ambulation/Gait assistance: Min assist Gait Distance (Feet): 10 Feet Assistive device: Rolling walker (2 wheeled) Gait Pattern/deviations: Step-through pattern General Gait Details: Very slow deliberate steps with excellent effort.  No LOB or buckling.  HR  110'120's during gait. Gait velocity: decreased   ADL: ADL Overall ADL's : Needs assistance/impaired General ADL Comments: Pt requires setup-min A for seated upper body ADLs including grooming and feeding.  Pt requires mod-max assist for lower body dressing, bathing, toileting, and functional mobility.  OTR provided setup assist for pt to brush teeth seated EOB while PT provided min A for seated balance.   Cognition: Cognition Overall Cognitive Status: Within Functional Limits for tasks assessed Orientation Level: Oriented X4 Cognition Arousal/Alertness: Awake/alert Behavior During Therapy: WFL for tasks assessed/performed Overall Cognitive Status: Within Functional Limits for tasks assessed General Comments: tearful at times.  encouragement given.  Voiced frustration over daughters dependance on her and feeling that she needs to get home to help.     Blood pressure Marland Kitchen)  151/116, pulse 85, temperature 97.9 F (36.6 C), temperature source Oral, resp. rate 16, height 5' (1.524 m), weight 47.3 kg, SpO2 100 %. Physical Exam  Constitutional: She is oriented to person, place, and time. No distress.  Petite lady, well dressed with jewelry, nails done  HENT:  Head: Normocephalic and atraumatic.  Eyes: Pupils are equal, round, and reactive to light. EOM are normal.  Neck: No thyromegaly present.  Cardiovascular: Normal rate and regular rhythm. Exam reveals no friction rub.  No murmur heard. Respiratory: Effort normal. No respiratory distress. She has no wheezes.  GI: Soft. She exhibits no distension. There is no abdominal tenderness.  Musculoskeletal:        General: No deformity or edema.     Cervical back: Normal range of motion.     Comments: Pain along right trap, SCM, cervical paraspinals with palpation and  with left lateral bending of head/neck as well with leftward rotation of the head.   Neurological: She is alert and oriented to person, place, and time. She has normal reflexes.    2-3 beats of nystagmus with lateral gaze. No diplopia, gaze appears conjugate. Decreased FTN, depth perception difficulties, L>R. Truncal ataxia with standing, attempts to transfer from chair to bed. Motor nearly 4+/5 in all 4's. No sensory deficits. Good insight and awareness. Functional memory  Skin: No rash noted. She is not diaphoretic. No erythema.  Psychiatric: She has a normal mood and affect. Her behavior is normal.      Lab Results Last 48 Hours        Results for orders placed or performed during the hospital encounter of 07/12/19 (from the past 48 hour(s))  Glucose, capillary     Status: Abnormal    Collection Time: 07/18/19  4:24 PM  Result Value Ref Range    Glucose-Capillary 316 (H) 70 - 99 mg/dL      Comment: Glucose reference range applies only to samples taken after fasting for at least 8 hours.  Glucose, capillary     Status: Abnormal    Collection Time: 07/18/19  9:31 PM  Result Value Ref Range    Glucose-Capillary 101 (H) 70 - 99 mg/dL      Comment: Glucose reference range applies only to samples taken after fasting for at least 8 hours.  Glucose, capillary     Status: Abnormal    Collection Time: 07/19/19  7:34 AM  Result Value Ref Range    Glucose-Capillary 330 (H) 70 - 99 mg/dL      Comment: Glucose reference range applies only to samples taken after fasting for at least 8 hours.  Glucose, capillary     Status: Abnormal    Collection Time: 07/19/19 11:26 AM  Result Value Ref Range    Glucose-Capillary 324 (H) 70 - 99 mg/dL      Comment: Glucose reference range applies only to samples taken after fasting for at least 8 hours.  Glucose, capillary     Status: Abnormal    Collection Time: 07/19/19  4:49 PM  Result Value Ref Range    Glucose-Capillary 157 (H) 70 - 99 mg/dL      Comment: Glucose reference range applies only to samples taken after fasting for at least 8 hours.  Glucose, capillary     Status: Abnormal    Collection Time: 07/19/19  8:42 PM  Result  Value Ref Range    Glucose-Capillary 321 (H) 70 - 99 mg/dL      Comment: Glucose reference range applies only  to samples taken after fasting for at least 8 hours.  Glucose, capillary     Status: Abnormal    Collection Time: 07/20/19  7:57 AM  Result Value Ref Range    Glucose-Capillary 291 (H) 70 - 99 mg/dL      Comment: Glucose reference range applies only to samples taken after fasting for at least 8 hours.      Imaging Results (Last 48 hours)  No results found.           Medical Problem List and Plan: 1.  Functional and mobility deficits secondary to left cerebellar infarct             -patient may may shower             -ELOS/Goals: 7-10 days, mod I to supervision  2.  Antithrombotics: -DVT/anticoagulation:  Pharmaceutical: Lovenox             -antiplatelet therapy: On low dose ASA 3. C6/C7 radiculopathy/Pain Management: s/p ESI- 3/23. Pt with notable multifactorial spondylosis in mid to lower cervical spine             -add heating pad.              -utilize muscle relaxant,  Muscle balm             -ask therapy to address posture, ROM             -Will also continue Hydrocodone prn.  4. Mood: LCSW to follow for evaluation and support.              -antipsychotic agents: N/A 5. Neuropsych: This patient is capable of making decisions on her own behalf. 6. Skin/Wound Care: Routine pressure relief measures.  7. Fluids/Electrolytes/Nutrition: Monitor I/O. Check lytes in am.  8. HTN: Monitor BP tid--on Lisinopril with BP still poorly controlled. SBP >100's. Will likely need titration of meds.   9. T2DM post pancreatectomy: Poorly controlled with Hgb A1C- 12.5. Monitor BS ac/hs--poor controlled. Resume 70/30 insulin. Was not using metformin PTA. Will d/c Glucerna between meals as likely affecting BS and change to Ensure Max which has 5g net carbs.  10 Collagen Vascular disease/discoid Lupus: Resume Plaquenil and folic acid.  11. Pancreatic insufficiency/S/p Pancreatectomy: Resume  pancrease tid ac. Will resume psyllium and loperemide prn for chronic diarrhea.  12. Dyslipidemia: On high dose statin.  13. Dizziness: Continue low dose antivert. May need vestibular evaluation.  79. H/o Major depressive disorder: Continue Wellbutrin--resume atarax bid prn (for anxiety). Has not been using Remeron.   15. Tobacco abuse: Willing to try nicotine gum--will order.  16. Vitamin B 12 deficiency: Gets IM injection every month.        Bary Leriche, PA-C 07/20/2019  I have personally performed a face to face diagnostic evaluation of this patient and formulated the key components of the plan.  Additionally, I have personally reviewed laboratory data, imaging studies, as well as relevant notes and concur with the physician assistant's documentation above.  The patient's status has not changed from the original H&P.  Any changes in documentation from the acute care chart have been noted above.  [name], MD, [facility]

## 2019-07-20 NOTE — Care Management Important Message (Signed)
Important Message  Patient Details  Name: Brittany [name] MRN: [id number] Date of Birth: 09-Sep-1960   Medicare Important Message Given:  Yes     [name] 07/20/2019, 11:06 AM

## 2019-07-20 NOTE — Discharge Summary (Signed)
Physician Discharge Summary  Patient ID: Brittany [name] MRN: [id number] DOB/AGE: 10/23/1960 59 y.o.  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)   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 [facility])   Discharged Condition: good  Hospital Course:  Brittany [name] a 59 y.o.femalewith medical history significant forpartial pancreatectomy secondary to low-grade dysplasia, on Pancrease and followed at [facility], history of discoid lupus on Plaquenil, uncontrolled diabetes, hypertension as well as C6-7 radiculopathy on epidural shots who presents to the emergency room with a 3-day complaint of nausea and vomiting,inability to hold anything down,high blood sugars at home, generalized malaise, headache, generalized weakness, insomnia.  She was treated in the emergency room with the insulin and IV fluids, her anion gap was normalized and she was admitted to the hospital for further treatment. Also had a CT head showed a left cerebellar stroke. Which was confirmed by MRI. Carotid ultrasound did not show significant occlusion. Patient has been followed by neurology.  6/2.Patient had a worsening nausea vomiting. Repeated CT scan showed left cerebellar stroke withworsening edema and mild midline shift. Has been seen by neurology, deterioration from this pointis unlikely.  6/5.Patient condition had improved, still has some headaches. No nausea anymore. Repeated CT scan. Patient is scheduled to be transferred to [facility] Monday.  6/6.  CT scan of the brain reviewed, showed a left large  cerebellar infarct with edema, not worsened than previous.  #1.  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 Patient still has a labile glucose.    Briefly treated with Lantus, but glucose does not seem to be better.  I will resume her home dose 70/30, please adjust dose based on glucose level.  #2.  Acute left cerebellar stroke. Condition stable.    Continue aspirin and a statin.  3.  Essential hypertension. Home medicines.  4.  Discoid lupus erythematous. Resume home medicines.  5.  Hypokalemia.  Resolved.  Consults: neurology  Significant Diagnostic Studies:  MRI HEAD WITHOUT AND WITH CONTRAST  TECHNIQUE: Multiplanar, multiecho pulse sequences of the brain and surrounding structures were obtained without and with intravenous contrast.  CONTRAST:  43mL GADAVIST GADOBUTROL 1 MMOL/ML IV SOLN  COMPARISON:  Correlation made with CT earlier same day  FINDINGS: Brain: There is a moderate size area of restricted diffusion within the inferior left cerebellar hemisphere. There is no associated enhancement. Mass effect is mild without ascending or descending herniation or hydrocephalus. Mild associated susceptibility likely reflecting petechial hemorrhage  Small chronic infarct of the right frontal lobe. Additional patchy and confluent T2 hyperintensity in the supratentorial white matter is nonspecific but probably reflects mild to moderate chronic microvascular ischemic changes.  Ventricles and sulci are within normal limits in size and configuration. There is no extra-axial fluid collection. There is no intracranial mass, mass effect, or edema. There is no hydrocephalus or extra-axial fluid collection. No abnormal enhancement.  Vascular: Major vessel flow voids at the skull base are preserved.  Skull and upper cervical spine: Normal marrow signal is preserved.  Sinuses/Orbits: Paranasal sinuses are aerated. Orbits are unremarkable.  Other:  Sella is unremarkable.  Mastoid air cells are clear.  IMPRESSION: Acute  infarction of inferior left cerebellum. Minor associated petechial hemorrhage. Mass effect is present without herniation or hydrocephalus.  Mild to moderate chronic microvascular ischemic changes. Small right frontal chronic infarct.   Electronically Signed   By: Macy Mis M.D.   On: 07/13/2019 10:35  BILATERAL CAROTID DUPLEX ULTRASOUND  TECHNIQUE: Pearline Cables scale imaging, color Doppler and duplex ultrasound were performed of bilateral carotid and vertebral arteries in the neck.  COMPARISON:  None  FINDINGS: Criteria: Quantification of carotid stenosis is based on velocity parameters that correlate the residual internal carotid diameter with NASCET-based stenosis levels, using the diameter of the distal internal carotid lumen as the denominator for stenosis measurement.  The following velocity measurements were obtained:  RIGHT  ICA: 72/31 cm/sec  CCA: 08/67 cm/sec  SYSTOLIC ICA/CCA RATIO:  1.5  ECA: 51 cm/sec  LEFT  ICA: 56/26 cm/sec  CCA: 61/95 cm/sec  SYSTOLIC ICA/CCA RATIO:  1.2  ECA: 56 cm/sec  RIGHT CAROTID ARTERY: Examination of the preliminary grayscale images demonstrate mild intimal thickening within the common carotid artery. Scattered calcified plaque is noted within the common and internal carotid arteries. The waveforms, velocities and flow velocity ratios however demonstrate no evidence of focal hemodynamically significant stenosis.  RIGHT VERTEBRAL ARTERY:  Antegrade in nature.  LEFT CAROTID ARTERY: Preliminary grayscale images demonstrate minimal atherosclerotic plaque in the region of the carotid bulb. The waveforms, velocities and flow velocity ratios however demonstrate no evidence of focal hemodynamically significant stenosis.  LEFT VERTEBRAL ARTERY:  Antegrade in nature.  IMPRESSION: Mild atherosclerotic plaque bilaterally without  evidence of focal hemodynamically significant stenosis.   Electronically Signed   By: Inez Catalina M.D.   On: 07/14/2019 02:15  CT HEAD WITHOUT CONTRAST  TECHNIQUE: Contiguous axial images were obtained from the base of the skull through the vertex without intravenous contrast.  COMPARISON:  Head CT dated 07/13/2019.  FINDINGS: Brain: Large area of edema involving the left cerebellum related to recent infarct. Overall increased in the amount of edema with associated mass effect and approximately 12 mm left right cerebellar shift. There is also mass effect on the fourth ventricle. No evidence of transtentorial herniation or inferior herniation through the foramen magnum. No significant ventricular dilatation. There is otherwise mild diffuse chronic microvascular ischemic changes and an area of old infarct or chronic microvascular ischemic changes involving the right frontal lobe. There is no acute intracranial hemorrhage. No subfalcine herniation.  Vascular: No hyperdense vessel or unexpected calcification.  Skull: Normal. Negative for fracture or focal lesion.  Sinuses/Orbits: No acute finding.  Other: None  IMPRESSION: 1. Increased edema in the left cerebellum with approximately 12 mm cerebellar shift to the right of the midline. Continued close follow-up recommended. 2. No acute bleed.  No hydrocephalus.   Electronically Signed   By: Anner Crete M.D.   On: 07/14/2019 20:48  CT HEAD WITHOUT CONTRAST  TECHNIQUE: Contiguous axial images were obtained from the base of the skull through the vertex without intravenous contrast.  COMPARISON:  07/14/2019  FINDINGS: Brain: Extensive infarction affecting the left cerebellar hemisphere and cerebellar vermis as seen previously with swelling and mass effect but no interval hemorrhage. Narrowing of the fourth ventricle,, with very slight increase in size of the lateral and third ventricles. Chronic  small-vessel ischemic changes again evident within the hemispheric white matter. Old right frontal cortical and subcortical infarction.  Vascular: There is atherosclerotic calcification of the major vessels at the base of the brain.  Skull: Negative  Sinuses/Orbits: Clear/normal  Other: None  IMPRESSION: Large left cerebellar infarction as seen previously. No evidence of hemorrhagic transformation. Mass-effect upon the fourth ventricle. Mild increase in size of the lateral and third ventricles since the previous study indicates some degree of fourth ventricular obstruction.   Electronically Signed   By: Nelson Chimes M.D.   On: 07/18/2019 10:35       Treatments: Stroke work-up, statin and aspirin.  Discharge Exam: Blood pressure (!) 184/119, pulse 100, temperature 98.5 F (36.9 C), temperature source Oral, resp. rate 15, height 5' (1.524 m), weight 47.3 kg, SpO2 99 %. General appearance: alert and cooperative Resp: clear to auscultation bilaterally Cardio: regular rate and rhythm, S1, S2 normal, no murmur, click, rub or gallop GI: soft, non-tender; bowel sounds normal; no masses,  no organomegaly Extremities: extremities normal, atraumatic, no cyanosis or edema Neurologic: Grossly normal  Disposition: Discharge disposition: [facility]-Rehab Facility       Discharge Instructions    Diet - low sodium heart healthy   Complete by: As directed    Increase activity slowly   Complete by: As directed      Allergies as of 07/20/2019      Reactions   Cephalosporins Itching   Hydromorphone Itching   Keflin [cephalothin] Itching      Medication List    STOP taking these medications   meloxicam 15 MG tablet Commonly known as: MOBIC     TAKE these medications   aspirin 81 MG chewable tablet Chew 1 tablet (81 mg total) by mouth daily.   atorvastatin 80 MG tablet Commonly known as: LIPITOR Take 80 mg by mouth Nightly.   benazepril 10 MG tablet Commonly  known as: LOTENSIN Take 10 mg by mouth daily.   buPROPion 300 MG 24 hr tablet Commonly known as: WELLBUTRIN XL Take 300 mg by mouth daily.   folic acid 1 MG tablet Commonly known as: FOLVITE Take 1 mg by mouth daily.   gabapentin 600 MG tablet Commonly known as: NEURONTIN Take 1,800 mg by mouth at bedtime.   hydroxychloroquine 200 MG tablet Commonly known as: PLAQUENIL Take 200 mg by mouth daily.   hydrOXYzine 10 MG tablet Commonly known as: ATARAX/VISTARIL Take 10 mg by mouth 2 (two) times daily.   metFORMIN 1000 MG tablet Commonly known as: GLUCOPHAGE Take 1,000 mg by mouth daily with breakfast.   mirtazapine 15 MG tablet Commonly known as: REMERON Take 15 mg by mouth at bedtime.   NovoLIN 70/30 (70-30) 100 UNIT/ML injection Generic drug: insulin NPH-regular Human Inject 18 Units into the skin 2 (two) times daily with a meal.   Pancrelipase (Lip-Prot-Amyl) 24000-76000 units Cpep Take 3 capsules by mouth 3 (three) times daily.   rOPINIRole 0.5 MG tablet Commonly known as: REQUIP Take 0.5 mg by mouth at bedtime.            Durable Medical Equipment  (From admission, onward)         Start     Ordered   07/14/19 1309  For home use only DME Walker rolling  Once    Question Answer Comment  Walker: With [place] Whe[employer]   Patient needs a walker to treat with the following condition Stroke (cerebrum) ([place])      07/14/19 1308         Follow-up Information    Clinic, [facility]. Schedule an appointment as soon as possible for a visit in 2 week(s).   Why: For recheck Contact information: [place] [address] [phone]  [facility] Follow up in 4 week(s).   Contact information: [place] [phone]         35 minutes Signed: Sharen Hones 07/20/2019, 9:05 AM

## 2019-07-21 ENCOUNTER — Inpatient Hospital Stay (HOSPITAL_COMMUNITY): Payer: Medicare Other | Admitting: Occupational Therapy

## 2019-07-21 ENCOUNTER — Inpatient Hospital Stay (HOSPITAL_COMMUNITY): Payer: Medicare Other

## 2019-07-21 ENCOUNTER — Inpatient Hospital Stay (HOSPITAL_COMMUNITY): Payer: Medicare Other | Admitting: Physical Therapy

## 2019-07-21 LAB — COMPREHENSIVE METABOLIC PANEL
ALT: 16 U/L (ref 0–44)
AST: 21 U/L (ref 15–41)
Albumin: 3 g/dL — ABNORMAL LOW (ref 3.5–5.0)
Alkaline Phosphatase: 55 U/L (ref 38–126)
Anion gap: 16 — ABNORMAL HIGH (ref 5–15)
BUN: 13 mg/dL (ref 6–20)
CO2: 24 mmol/L (ref 22–32)
Calcium: 9.1 mg/dL (ref 8.9–10.3)
Chloride: 98 mmol/L (ref 98–111)
Creatinine, Ser: 0.57 mg/dL (ref 0.44–1.00)
GFR calc Af Amer: >60 mL/min (ref >60)
GFR calc non Af Amer: >60 mL/min (ref >60)
Glucose, Bld: 131 mg/dL — ABNORMAL HIGH (ref 70–99)
Potassium: 3.7 mmol/L (ref 3.5–5.1)
Sodium: 138 mmol/L (ref 135–145)
Total Bilirubin: 0.4 mg/dL (ref 0.3–1.2)
Total Protein: 6.5 g/dL (ref 6.5–8.1)

## 2019-07-21 LAB — CBC WITH DIFFERENTIAL/PLATELET
Abs Immature Granulocytes: 0.03 10*3/uL (ref 0.00–0.07)
Basophils Absolute: 0 10*3/uL (ref 0.0–0.1)
Basophils Relative: 1 %
Eosinophils Absolute: 0.1 10*3/uL (ref 0.0–0.5)
Eosinophils Relative: 1 %
HCT: 40.4 % (ref 36.0–46.0)
Hemoglobin: 13.4 g/dL (ref 12.0–15.0)
Immature Granulocytes: 0 %
Lymphocytes Relative: 44 %
Lymphs Abs: 3 10*3/uL (ref 0.7–4.0)
MCH: 31.8 pg (ref 26.0–34.0)
MCHC: 33.2 g/dL (ref 30.0–36.0)
MCV: 95.7 fL (ref 80.0–100.0)
Monocytes Absolute: 0.7 10*3/uL (ref 0.1–1.0)
Monocytes Relative: 11 %
Neutro Abs: 2.9 10*3/uL (ref 1.7–7.7)
Neutrophils Relative %: 43 %
Platelets: 459 10*3/uL — ABNORMAL HIGH (ref 150–400)
RBC: 4.22 MIL/uL (ref 3.87–5.11)
RDW: 12.3 % (ref 11.5–15.5)
WBC: 6.8 10*3/uL (ref 4.0–10.5)
nRBC: 0 % (ref 0.0–0.2)

## 2019-07-21 LAB — GLUCOSE, CAPILLARY
Glucose-Capillary: 125 mg/dL — ABNORMAL HIGH (ref 70–99)
Glucose-Capillary: 116 mg/dL — ABNORMAL HIGH (ref 70–99)
Glucose-Capillary: 284 mg/dL — ABNORMAL HIGH (ref 70–99)
Glucose-Capillary: 254 mg/dL — ABNORMAL HIGH (ref 70–99)

## 2019-07-21 MED ORDER — LIVING WELL WITH DIABETES BOOK
Freq: Once | Status: AC
  Filled 2019-07-21: qty 1

## 2019-07-21 MED ORDER — GLUCERNA SHAKE PO LIQD
237.0000 mL | Freq: Three times a day (TID) | ORAL | Status: DC
  Administered 2019-07-21 – 2019-08-03 (×34): 237 mL via ORAL
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
## 2019-07-21 NOTE — Progress Notes (Signed)
Inpatient Rehabilitation  Patient information reviewed and entered into eRehab system by Michale Emmerich M. Miquan Tandon, M.A., CCC/SLP, PPS Coordinator.  Information including medical coding, functional ability and quality indicators will be reviewed and updated through discharge.    

## 2019-07-21 NOTE — Progress Notes (Signed)
Initial Nutrition Assessment  DOCUMENTATION CODES:   Non-severe (moderate) malnutrition in context of chronic illness  INTERVENTION:   - Continue MVI with minerals daily  - Glucerna Shake po TID, each supplement provides 220 kcal and 10 grams of protein  - Encourage adequate PO intake  NUTRITION DIAGNOSIS:   Moderate Malnutrition related to chronic illness (stroke, poorly managed T2DM) as evidenced by mild fat depletion, moderate fat depletion, mild muscle depletion, moderate muscle depletion, percent weight loss (13.1% weight loss in less than 5 months).  GOAL:   Patient will meet greater than or equal to 90% of their needs  MONITOR:   PO intake, Supplement acceptance, Labs, Weight trends  REASON FOR ASSESSMENT:   Malnutrition Screening Tool    ASSESSMENT:   59 year old female with PMH of T2DM, depression, discoid Lupus, tobacco use. Admitted on 07/12/19 with 3 day history of N/V and dizziness. Pt was found to have DKA. 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 Admitted to CIR on 6/07.   Spoke with pt at bedside. Pt reports having a good appetite and eating well. Pt states that at [facility], she was eating breakfast, lunch, and dinner. Pt reports that at home, she also had a good appetite.  Pt reports that despite having a good appetite and not changing her eating habits, she has lost a significant amount of weight. Pt reports her UBW as 125 lbs and states that she last weighed this "a few months ago." Pt believes that she now weighs around 105 lbs.  Reviewed weight history in chart. Pt with a 7.1 kg weight loss since 03/15/19. This is a 13.1% weight loss in less than 5 months which is severe and significant for timeframe.  Wt Readings from Last 5 Encounters:  07/13/19 47.3 kg  05/28/19 47.7 kg  03/15/19 54.4 kg  10/17/18 52.2 kg  05/12/18 54.4 kg   Pt willing  to consume oral nutrition supplements during admission. Will change Ensure Max to Glucerna to increase pt's kcal consumption given significant weight loss and malnutrition.  RD took pt's dinner order.  Meal Completion: 90-100%  Medications reviewed and include: folic acid, SSI, Novolog 70/30 18 units BID, MVI with minerals, miralax, Ensure Max  Labs reviewed: HDL 33, LDL 153 CBG's: 125-358 x 24 hours  NUTRITION - FOCUSED PHYSICAL EXAM:    Most Recent Value  Orbital Region  Mild depletion  Upper Arm Region  Moderate depletion  Thoracic and Lumbar Region  Mild depletion  Buccal Region  Mild depletion  Temple Region  Mild depletion  Clavicle Bone Region  Moderate depletion  Clavicle and Acromion Bone Region  Moderate depletion  Scapular Bone Region  Mild depletion  Dorsal Hand  Mild depletion  Patellar Region  Moderate depletion  Anterior Thigh Region  Moderate depletion  Posterior Calf Region  Mild depletion  Edema (RD Assessment)  None  Hair  Reviewed  Eyes  Reviewed  Mouth  Reviewed  Skin  Reviewed  Nails  Reviewed       Diet Order:   Diet Order            Diet Carb Modified Fluid consistency: Thin; Room service appropriate? Yes  Diet effective now              EDUCATION NEEDS:   Education needs have been addressed  Skin:  Skin Assessment: Reviewed RN Assessment  Last BM:  07/20/19  Height:   Ht Readings from Last 1 Encounters:  07/13/19 5' (1.524 m)    Weight:   Wt Readings from Last 1 Encounters:  07/13/19 47.3 kg    Ideal Body Weight:  45.5 kg  BMI:  20.37  Estimated Nutritional Needs:   Kcal:  1500-1700  Protein:  75-90 grams  Fluid:  >/= 1.5 L    Gaynell Face, MS, RD, LDN Inpatient Clinical Dietitian Pager: [phone] Weekend/After Hours: [phone]

## 2019-07-21 NOTE — Progress Notes (Signed)
+/-   sleep. 1 PRN vicodin given at 2210 for C/O BLE discomfort, not new per patient. At 0220, patient crying in pain complaining of HA. 2 PRN vicodin given at 0223. Reports having HA's since having stroke. Rested quietly, rest of shift. Patrici Ranks A

## 2019-07-21 NOTE — Care Management (Signed)
Patient ID: Brittany [name], female   DOB: 02/19/1960, 59 y.o.   MRN: [id number]  Met with the patient to review role of CM in collaboration with the SW Margreta Journey) to facilitate discharge to home with daughter. Reviewed secondary stroke prevention and risk factors including DM, HTN, HLD, smoking. Reviewed medications to manage risks along with dietary recommendations. Given handouts on smoking cessation, carbohydrate counting, dm management and DASH diet. The patient has already spoken with a diabetic coordinator and dietician. Patient very emotional and reviewed referral to neuropsych for coping.

## 2019-07-21 NOTE — Plan of Care (Signed)
Mod assist with adls 
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
## 2019-07-21 NOTE — Plan of Care (Signed)
LTGs established °
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
## 2019-07-21 NOTE — Progress Notes (Signed)
[facility] PHYSICAL MEDICINE & REHABILITATION PROGRESS NOTE   Subjective/Complaints:  On her way to PT, no c/os.  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 Recent Labs    07/21/19 0546  WBC 6.8  HGB 13.4  HCT 40.4  PLT 459*   No results for input(s): NA, K, CL, CO2, GLUCOSE, BUN, CREATININE, CALCIUM in the last 72 hours.  Intake/Output Summary (Last 24 hours) at 07/21/2019 0835 Last data filed at 07/20/2019 2300 Gross per 24 hour  Intake 740 ml  Output --  Net 740 ml     Physical Exam: Vital Signs Blood pressure (!) 131/94, pulse 89, temperature 97.9 F (36.6 C), temperature source Oral, resp. rate 18, SpO2 97 %.  General: No acute distress Mood and affect are appropriate Heart: Regular rate and rhythm no rubs murmurs or extra sounds Lungs: Clear to auscultation, breathing unlabored, no rales or wheezes Abdomen: Positive bowel sounds, soft nontender to palpation, nondistended Extremities: No clubbing, cyanosis, or edema Skin: No evidence of breakdown, no evidence of rash Neurologic: Cranial nerves II through XII intact, motor strength is 5/5 in bilateral deltoid, bicep, tricep, grip, hip flexor, knee extensors, ankle dorsiflexor and plantar flexor Sensory exam reduced  sensation to light touch Left l upper and lower extremities  Musculoskeletal: Full range of motion in all 4 extremities. No joint swelling    Assessment/Plan: 1. 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  Physiatrist is providing close team supervision and 24 hour management of active medical problems listed below.  Physiatrist and rehab team continue to assess barriers to discharge/monitor patient progress toward functional and medical goals  Care Tool:  Bathing              Bathing assist       Upper Body  Dressing/Undressing Upper body dressing        Upper body assist      Lower Body Dressing/Undressing Lower body dressing            Lower body assist       Toileting Toileting    Toileting assist Assist for toileting: Minimal Assistance - Patient > 75%     Transfers Chair/bed transfer  Transfers assist     Chair/bed transfer assist level: Minimal Assistance - Patient > 75%     Locomotion Ambulation   Ambulation assist              Walk 10 feet activity   Assist           Walk 50 feet activity   Assist           Walk 150 feet activity   Assist           Walk 10 feet on uneven surface  activity   Assist           Wheelchair     Assist               Wheelchair 50 feet with 2 turns activity    Assist            Wheelchair 150 feet activity     Assist          Blood pressure (!) 131/94, pulse 89, temperature 97.9 F (36.6 C), temperature source Oral, resp. rate 18, SpO2 97 %.  Medical  Problem List and Plan: 1.Functional and mobility deficitssecondary to left cerebellar infarct -patient maymayshower -ELOS/Goals: 7-10 days, mod I to supervision 2. Antithrombotics: -DVT/anticoagulation:Pharmaceutical:Lovenox -antiplatelet therapy: On low dose ASA 3.C6/C7 radiculopathy/Pain Management:s/p ESI- 3/23.Pt with notable multifactorial spondylosis in mid to lower cervical spine -add heating pad.  -utilize muscle relaxant, Muscle balm -ask therapy to address posture, ROM -Willalsocontinue Hydrocodone prn. 4. Mood:LCSW to follow for evaluation and support. -antipsychotic agents: N/A 5. Neuropsych: This patientiscapable of making decisions on herown behalf. 6. Skin/Wound Care:Routine pressure relief measures. 7. Fluids/Electrolytes/Nutrition:Monitor I/O. Check lytes in am.  8. HTN:  Monitor BP tid--on Lisinoprilwith BP still poorly controlled. SBP >100's. Will likely need titration of meds. Vitals:   07/20/19 1948 07/21/19 0456  BP: (!) 116/91 (!) 131/94  Pulse: 92 89  Resp: 18 18  Temp: 97.9 F (36.6 C) 97.9 F (36.6 C)  SpO2: 99% 27%  mild diastolic elevation  9. T2DM post pancreatectomy: Poorly controlled with Hgb A1C- 12.5. Monitor BS ac/hs--poor controlled. Resume 70/30 insulin. Was not usingmetforminPTA.Will d/c Glucerna between meals as likely affecting BS and change to Ensure Max which has 5g net carbs.  CBG (last 3)  Recent Labs    07/20/19 1718 07/20/19 2118 07/21/19 0555  GLUCAP 358* 190* 125*  controlled this am but elevated yest pm  10 Collagen Vascular disease/discoid Lupus: Resume Plaquenil and folic acid.  11. Pancreatic insufficiency/S/p Pancreatectomy: Resume pancrease tid ac. Will resume psyllium and loperemide prn for chronic diarrhea.  12. Dyslipidemia: On high dose statin.  13. Dizziness:Continue low dose antivert. May need vestibular evaluation. 17. H/o Major depressive disorder: Continue Wellbutrin--resume atarax bidprn(for anxiety). Has not been using Remeron. 15.Tobacco abuse: Willing to try nicotine gum--will order. 16. Vitamin B 12 deficiency: Gets IM injection every month.    LOS: 1 days A FACE TO FACE EVALUATION WAS PERFORMED  Brittany [name] 07/21/2019, 8:35 AM

## 2019-07-21 NOTE — Progress Notes (Signed)
Physical Therapy Session Note  Patient Details  Name: Brittany [name] MRN: [id number] Date of Birth: Jan 10, 1961  Today's Date: 07/21/2019 PT Individual Time: 0109-3235 PT Individual Time Calculation (min): 40 min   Short Term Goals: Week 1:  PT Short Term Goal 1 (Week 1): Pt will maintain sitting balance w/functional task such as while donning shoes and socks w/supervision only PT Short Term Goal 2 (Week 1): Pt will transfer bed to wc w/LRAD and cga PT Short Term Goal 3 (Week 1): Pt will ambulate 18f w/LRAD and cga  Skilled Therapeutic Interventions/Progress Updates: Pt presented in w/c agreeable to therapy. Pt denies pain but states moderate dizziness and neck pain, per pt "no more than usual" and no intervention required at present. Pt transported to rehab gym and participated in ascending/descending stairs with B rails and min A with step to pattern and moderate cues. Pt then participated in gait on compliant surface and additional gait with RW. Pt was modA with increased time on compliant surface and minA on level tile with RW. Pt noted to have decreased B foot clearance and significantly decreased gait speed. Pt returned to w/c and transported back to room. Pt then performed stand pivot back to bed with minA and increased time. Pt returned to supine with CGA and use of bed features. Pt left in bed with bed alarm on, call bell within reach and needs met.      Therapy Documentation Precautions:  Precautions Precautions: Fall Restrictions Weight Bearing Restrictions: No General:   Vital Signs: Therapy Vitals Temp: 98.5 F (36.9 C) Pulse Rate: 96 Resp: 18 BP: (!) 129/99 Patient Position (if appropriate): Sitting Oxygen Therapy SpO2: 100 % O2 Device: Room Air Pain: Pain Assessment Pain Scale: 0-10 Pain Score: 8 (R side of neck) Pain Type: Acute pain Pain Location: Neck(head and neck) Pain Orientation: Right Pain Descriptors / Indicators: Aching;Constant;Headache Pain Onset:  On-going Patients Stated Pain Goal: 0 Mobility: Transfers Transfers: Sit to Stand;Stand to SLockheed MartinTransfers Sit to Stand: Minimal Assistance - Patient > 75% Stand to Sit: Minimal Assistance - Patient > 75% Stand Pivot Transfers: Minimal Assistance - Patient > 75% Stand Pivot Transfer Details: Verbal cues for sequencing;Verbal cues for safe use of DME/AE;Tactile cues for weight shifting;Tactile cues for posture;Verbal cues for technique;Verbal cues for precautions/safety Transfer (Assistive device): Rolling walker Locomotion :    Trunk/Postural Assessment : Cervical Assessment Cervical Assessment: Exceptions to WFL(forward head in sitting, leans to the L and R depending on pain levels) Thoracic Assessment Thoracic Assessment: Within Functional Limits Lumbar Assessment Lumbar Assessment: Within Functional Limits Postural Control Postural Control: Deficits on evaluation  Balance: Balance Balance Assessed: Yes Dynamic Sitting Balance Sitting balance - Comments: dynamic sitting at EOB/toilet/TTB alternating between no UE support and unilateral support to perform dynamic ADL tasks such as donning pants/socks/shoes Exercises:   Other Treatments:      Therapy/Group: Individual Therapy  Nathalia Wismer 07/21/2019, 4:18 PM

## 2019-07-21 NOTE — Progress Notes (Signed)
[facility] Individual Statement of Services  Patient Name:  Brittany [name]  Date:  07/21/2019  Welcome to [facility].  Our goal is to provide you with an individualized program based on your diagnosis and situation, designed to meet your specific needs.  With this comprehensive rehabilitation program, you will be expected to participate in at least 3 hours of rehabilitation therapies Monday-Friday, with modified therapy programming on the weekends.  Your rehabilitation program will include the following services:  Physical Therapy (PT), Occupational Therapy (OT), Speech Therapy (ST), 24 hour per day rehabilitation nursing, Therapeutic Recreaction (TR), Neuropsychology, Care Coordinator, Rehabilitation Medicine, Nutrition Services, Pharmacy Services and Other  Weekly team conferences will be held on Wednesdays to discuss your progress.  Your Inpatient Rehabilitation Care Coordinator will talk with you frequently to get your input and to update you on team discussions.  Team conferences with you and your family in attendance may also be held.  Expected length of stay: 7-10 Days  Overall anticipated outcome: MOD I to Supervision  Depending on your progress and recovery, your program may change. Your Inpatient Rehabilitation Care Coordinator will coordinate services and will keep you informed of any changes. Your Inpatient Rehabilitation Care Coordinator's name and contact numbers are listed  below.  The following services may also be recommended but are not provided by the [facility]:    [place] will be made to provide these services after discharge if needed.  Arrangements include referral to agencies that provide these services.  Your insurance has been verified to be:  Medicare Your primary doctor is:  [facility]  Pertinent information  will be shared with your doctor and your insurance company.  Inpatient Rehabilitation Care Coordinator:  Erlene Quan, [place] or [phone]   Information discussed with and copy given to patient by: Dyanne Iha, 07/21/2019, 11:47 AM

## 2019-07-22 ENCOUNTER — Inpatient Hospital Stay (HOSPITAL_COMMUNITY): Payer: Medicare Other | Admitting: Physical Therapy

## 2019-07-22 ENCOUNTER — Inpatient Hospital Stay (HOSPITAL_COMMUNITY): Payer: Medicare Other | Admitting: Occupational Therapy

## 2019-07-22 DIAGNOSIS — E440 Moderate protein-calorie malnutrition: Secondary | ICD-10-CM | POA: Insufficient documentation

## 2019-07-22 LAB — GLUCOSE, CAPILLARY
Glucose-Capillary: 226 mg/dL — ABNORMAL HIGH (ref 70–99)
Glucose-Capillary: 189 mg/dL — ABNORMAL HIGH (ref 70–99)
Glucose-Capillary: 240 mg/dL — ABNORMAL HIGH (ref 70–99)
Glucose-Capillary: 192 mg/dL — ABNORMAL HIGH (ref 70–99)

## 2019-07-22 MED ORDER — INSULIN ASPART PROT & ASPART (70-30 MIX) 100 UNIT/ML ~~LOC~~ SUSP
20.0000 [IU] | Freq: Two times a day (BID) | SUBCUTANEOUS | Status: DC
  Administered 2019-07-22 – 2019-07-23 (×2): 20 [IU] via SUBCUTANEOUS
  Filled 2019-07-22: qty 10

## 2019-07-22 MED ORDER — CARBAMIDE PEROXIDE 6.5 % OT SOLN
5.0000 [drp] | Freq: Two times a day (BID) | OTIC | Status: DC
  Administered 2019-07-22 – 2019-08-03 (×22): 5 [drp] via OTIC
  Filled 2019-07-22: qty 15

## 2019-07-22 NOTE — Plan of Care (Signed)
LTGs adjusted

## 2019-07-22 NOTE — Progress Notes (Signed)
Patient ID: Brittany [name], female   DOB: April 14, 1960, 59 y.o.   MRN: [id number]  Team Conference Report to Patient/Family  Team Conference discussion was reviewed with the patient and caregiver, including goals, any changes in plan of care and target discharge date.  Patient and caregiver express understanding and are in agreement.  The patient has a target discharge date of 08/04/19.  Dyanne Iha 07/22/2019, 1:27 PM

## 2019-07-22 NOTE — Plan of Care (Signed)
  Problem: Consults Goal: RH STROKE PATIENT EDUCATION Description: See Patient Education module for education specifics  Outcome: Progressing   Problem: RH BOWEL ELIMINATION Goal: RH STG MANAGE BOWEL WITH ASSISTANCE Description: STG Manage Bowel with Mod I Assistance. Outcome: Progressing   Problem: RH SAFETY Goal: RH STG ADHERE TO SAFETY PRECAUTIONS W/ASSISTANCE/DEVICE Description: STG Adhere to Safety Precautions With Supervision Assistance/Device. Outcome: Progressing   Problem: RH COGNITION-NURSING Goal: RH STG ANTICIPATES NEEDS/CALLS FOR ASSIST W/ASSIST/CUES Description: STG Anticipates Needs/Calls for Assist With Supervision Assistance/Cues. Outcome: Progressing   Problem: RH KNOWLEDGE DEFICIT Goal: RH STG INCREASE KNOWLEDGE OF DIABETES Description: Pt will verbalize understanding of diabetes management including diet, exercise, and medication management with cues/reminders assist/cues using educational materials provided by staff.  Outcome: Progressing Goal: RH STG INCREASE KNOWLEDGE OF STROKE PROPHYLAXIS Description: Pt will be able to verbalize stroke risk factors and prevention techniques including smoking cessation, diet management, blood sugar control, and follow up care with physician with cues/reminders assist/cues using educational materials provided by staff.  Outcome: Progressing

## 2019-07-22 NOTE — Progress Notes (Addendum)
[facility] PHYSICAL MEDICINE & REHABILITATION PROGRESS NOTE   Subjective/Complaints:  Appreciate dietary note  C/o hearing loss Right ear  ROS:  No CP, SOB, N/V/D  Objective:   No results found. Recent Labs    07/21/19 0546  WBC 6.8  HGB 13.4  HCT 40.4  PLT 459*   Recent Labs    07/21/19 0546  NA 138  K 3.7  CL 98  CO2 24  GLUCOSE 131*  BUN 13  CREATININE 0.57  CALCIUM 9.1    Intake/Output Summary (Last 24 hours) at 07/22/2019 0900 Last data filed at 07/21/2019 1900 Gross per 24 hour  Intake 720 ml  Output --  Net 720 ml     Physical Exam: Vital Signs Blood pressure 108/87, pulse 100, temperature 98.6 F (37 C), temperature source Oral, resp. rate 16, SpO2 100 %.  General: No acute distress Mood and affect are appropriate ENT- cerumen blocking ext auditory canal R>L Heart: Regular rate and rhythm no rubs murmurs or extra sounds Lungs: Clear to auscultation, breathing unlabored, no rales or wheezes Abdomen: Positive bowel sounds, soft nontender to palpation, nondistended Extremities: No clubbing, cyanosis, or edema Skin: No evidence of breakdown, no evidence of rash Neurologic: Cranial nerves II through XII intact, motor strength is 5/5 in bilateral deltoid, bicep, tricep, grip, hip flexor, knee extensors, ankle dorsiflexor and plantar flexor Sensory exam reduced  sensation to light touch Left l upper and lower extremities Cerebellar without dysmetria FNF on L  Musculoskeletal: Full range of motion in all 4 extremities. No joint swelling    Assessment/Plan: 1. 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  Physiatrist is providing close team supervision and 24 hour management of active medical problems listed below.  Physiatrist and rehab team continue to assess barriers to discharge/monitor patient progress toward functional and medical  goals  Care Tool:  Bathing    Body parts bathed by patient: Right arm, Abdomen, Chest, Left arm, Front perineal area, Left upper leg, Face, Right upper leg   Body parts bathed by helper: Buttocks, Right lower leg, Left lower leg     Bathing assist Assist Level: Moderate Assistance - Patient 50 - 74%     Upper Body Dressing/Undressing Upper body dressing        Upper body assist Assist Level: Contact Guard/Touching assist    Lower Body Dressing/Undressing Lower body dressing      What is the patient wearing?: Pants, Underwear/pull up     Lower body assist Assist for lower body dressing: Moderate Assistance - Patient 50 - 74%     Toileting Toileting    Toileting assist Assist for toileting: Moderate Assistance - Patient 50 - 74%     Transfers Chair/bed transfer  Transfers assist     Chair/bed transfer assist level: Moderate Assistance - Patient 50 - 74%     Locomotion Ambulation   Ambulation assist      Assist level: Moderate Assistance - Patient 50 - 74% Assistive device: Hand held assist Max distance: 10   Walk 10 feet activity   Assist     Assist level: Moderate Assistance - Patient - 50 - 74% Assistive device: Hand held assist   Walk 50 feet activity   Assist Walk 50 feet with 2 turns activity did not occur: Safety/medical concerns         Walk 150 feet activity   Assist Walk 150 feet  activity did not occur: Safety/medical concerns         Walk 10 feet on uneven surface  activity   Assist     Assist level: Moderate Assistance - Patient - 50 - 74% Assistive device: Aeronautical engineer Will patient use wheelchair at discharge?: No             Wheelchair 50 feet with 2 turns activity    Assist            Wheelchair 150 feet activity     Assist          Blood pressure 108/87, pulse 100, temperature 98.6 F (37 C), temperature source Oral, resp. rate 16, SpO2 100  %.  Medical Problem List and Plan: 1.Functional and mobility deficitssecondary to left cerebellar infarct -patient maymayshower -ELOS/Goals: 7-10 days, mod I to supervision Team conference today please see physician documentation under team conference tab, met with team  to discuss problems,progress, and goals. Formulized individual treatment plan based on medical history, underlying problem and comorbidities. 2. Antithrombotics: -DVT/anticoagulation:Pharmaceutical:Lovenox -antiplatelet therapy: On low dose ASA 3.C6/C7 radiculopathy/Pain Management:s/p ESI- 3/23.Pt with notable multifactorial spondylosis in mid to lower cervical spine -add heating pad.  -utilize muscle relaxant, Muscle balm -ask therapy to address posture, ROM -Willalsocontinue Hydrocodone prn. 4. Mood:LCSW to follow for evaluation and support. -antipsychotic agents: N/A 5. Neuropsych: This patientiscapable of making decisions on herown behalf. 6. Skin/Wound Care:Routine pressure relief measures. 7. Fluids/Electrolytes/Nutrition:Monitor I/O. Check lytes in am. Low BMI with moderate protein malnutrition  8. HTN: Monitor BP tid--on Lisinoprilwith BP still poorly controlled. SBP >100's. Will likely need titration of meds. Vitals:   07/21/19 2033 07/22/19 0446  BP: (!) 130/92 108/87  Pulse: 100 100  Resp: 18 16  Temp: 98.5 F (36.9 C) 98.6 F (37 C)  SpO2: 100% 100%  controlled on Lotensin  9. T2DM post pancreatectomy: Poorly controlled with Hgb A1C- 12.5. Monitor BS ac/hs--poor controlled. Resume 70/30 insulin. Was not usingmetforminPTA.Will d/c Glucerna between meals as likely affecting BS and change to Ensure Max which has 5g net carbs.  CBG (last 3)  Recent Labs    07/21/19 1833 07/21/19 2035 07/22/19 0613  GLUCAP 284* 254* 226*  Adjust 70/30 dose increase to 20U BID   10 Collagen  Vascular disease/discoid Lupus: Resume Plaquenil and folic acid.  11. Pancreatic insufficiency/S/p Pancreatectomy: Resume pancrease tid ac. Will resume psyllium and loperemide prn for chronic diarrhea.  12. Dyslipidemia: On high dose statin.  13. Dizziness:Continue low dose antivert. May need vestibular evaluation. 62. H/o Major depressive disorder: Continue Wellbutrin--resume atarax bidprn(for anxiety). Has not been using Remeron. 15.Tobacco abuse: Willing to try nicotine gum--will order. 16. Vitamin B 12 deficiency: Gets IM injection every month.  17.  Cerumenosis R> L ear, with Right sided hearing loss-debrox  LOS: 2 days A FACE TO FACE EVALUATION WAS PERFORMED  Charlett Blake 07/22/2019, 9:00 AM

## 2019-07-22 NOTE — Progress Notes (Signed)
Physical Therapy Session Note  Patient Details  Name: Brittany [name] MRN: [id number] Date of Birth: [birth year]  Today's Date: 07/22/2019 PT Individual Time: 1100-1200 and 1300-1345 PT Individual Time Calculation (min): 60 min and 45 min  Short Term Goals: Week 1:  PT Short Term Goal 1 (Week 1): Pt will maintain sitting balance w/functional task such as while donning shoes and socks w/supervision only PT Short Term Goal 2 (Week 1): Pt will transfer bed to wc w/LRAD and cga PT Short Term Goal 3 (Week 1): Pt will ambulate 40f w/LRAD and cga  Skilled Therapeutic Interventions/Progress Updates: Tx1: Pt presented in bed sleeping requiring increased stimulation for arousal. Pt initially very lethargic falling asleep while talking prior to bed mobility requiring PTA to arouse again. Pt states mild dizziness but did not rate prior to supine to sit. Performed supine to sit with use of bed features and CGA. PTA donned slippers and pt performed STS CGA with verbal cues for hand placement. Performed ambulatory transfer to w/c with CGA poor B foot clearance and increased time. Pt transported to day room and performed stand pivot in same manner as prior to NuStep. Participated in NuStep L1 x 8 min for general conditioning and reciprocal activity. Pt required frequent re-direction as would be easily distracted by surroundings in minimally distracting environment. Pt then ambulated approx 211fto high/low mat with RW and minA. Pt required verbal cues for sequencing as would perform in a step to pattern and then pause between steps. Pt also noted be distractible during ambulation stopping and looking out window. At mat pt participated in STS x 5 no AD for static balance and BLE strengthening. Pt required facilitation to improve anterior wt shift for improved COM and to avoid bracing BLE against mat. Pt then transferred back to w/c with RW in same manner as prior to transported back to room. Pt remained in w/c at end of  session with belt alarm on, call bell within reach and needs met.   Tx2: Pt presented in w/c completing lunch and agreeable to therapy. Pt states some dizziness with nsg notified and meds provided during session. Pt also requesting to use bathroom. With increased time pt performed ambulatory transfer to toilet and was CGA for clothing management and transfers (+void). Pt ambulated to sink and performed hand hygiene in standing with RW and CGA then returned to w/c Pt was noted to lean against sink for support. Discussed with pt attempting to increase cadence and step length as pt noted to continue ambulating with step to pattern with RW.  [facility] and participated seated LE LAQ, hip flexion, and ankle pumps x 15 bilaterally. Pt returned to room at end of session and ageeable to remain in w/c with belt alarm on, call bell within reach and needs met.      Therapy Documentation Precautions:  Precautions Precautions: Fall Restrictions Weight Bearing Restrictions: No General:   Vital Signs: Therapy Vitals Temp: 98.8 F (37.1 C) Pulse Rate: 96 Resp: 18 BP: 108/73 Patient Position (if appropriate): Sitting Oxygen Therapy SpO2: 100 % O2 Device: Room Air   Therapy/Group: Individual Therapy  Ashaun Gaughan  Tane Biegler, PTA  07/22/2019, 4:08 PM

## 2019-07-22 NOTE — Progress Notes (Signed)
Occupational Therapy Session Note  Patient Details  Name: Brittany [name] MRN: [id number] Date of Birth: [birth year]  Today's Date: 07/22/2019 OT Individual Time: 0800-0909 and 1451-1541 OT Individual Time Calculation (min): 69 min and 50   Short Term Goals: Week 1:  OT Short Term Goal 1 (Week 1): Pt will complete toileting 3/3 tasks with Min A OT Short Term Goal 2 (Week 1): Pt will complete LB bathing at shower level with shower seat/bench with Min A OT Short Term Goal 3 (Week 1): Pt will consistently transfer to various surfaces with AD with Min A OT Short Term Goal 4 (Week 1): Pt will complete LB dressing with Min A with use of AE as needed  Skilled Therapeutic Interventions/Progress Updates:    Pt greeted at time of session in bed supine, agreeable to OT session to address bathing, dressing, toileting, tranfers. Bed mobility supine to sitting up and sit to stand Min A, ambulated to bathroom with RW and transferred to TTB all Min A. UB/LB bathing at TTB level with brief stands to wash pericarea with Min A for LB bathing utilizing figure 4 and therapist assist for buttocks, supervision for UB bathing. Pt ambulated short distance to toilet after drying off with RW and completed 3/3 toileting tasks with Min A for clothing management and standing balance. Attempted to ambulate back to room but d/t fatigue had to stop and sit in wheelchair for a rest break stating BLEs felt "anxious." UB/LB dressing while sitting and standing at sink level, Min A for donning sports bra d/t tight fabric and supervision for loose fitting tshirt and Mod A for donning pants and underwear for threading through a tight fabric and to don over hips in standing. Pt able to static stand throughout ADL session with unilateral support or brief periods of no support but does fatigue quickly and requires rest breaks. Extended time to complete tasks d/t rest breaks and emotional outbursts d/t stress and anxiety. Oral care and grooming  with supervision at sink level. Pt returned to supine in bed with Min A for bed mob and alarm on, call bell in reach and needs met.   Sesion 2: Pt greeted at time of session sitting up in wheelchair agreeable to work on functional mobility, transfers, and standing balance. Pt ambulated approx 40-50 feet with RW with CGA-Min A with cues to prevent forward lean and stay within the RW, most difficulty during the turn. Dynamic standing activity at [employer] with cognitive challenge to reach for light/button on contralateral side to challenge standing balance, memory, and endurance. Pt able to perform dynamic standing without UE support and without LOB. Once back in the room, pt needed to perform toileting and completed 3/3 tasks with Min A for transfer and clothing management but able to perform hygiene without assistance. Pt did have BM and recorded. Supervision for hand hygiene at sink level and stand pivot transfer without AD with Min A from wheelchair to bed, returned to supine with alarm on and all needs met.   Therapy Documentation Precautions:  Precautions Precautions: Fall Restrictions Weight Bearing Restrictions: No Pain: mild, 3/10 in RLE  Therapy/Group: Individual Therapy  Brittany [name] 07/22/2019, 9:00 AM
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
## 2019-07-23 ENCOUNTER — Inpatient Hospital Stay (HOSPITAL_COMMUNITY): Payer: Medicare Other | Admitting: Physical Therapy

## 2019-07-23 ENCOUNTER — Inpatient Hospital Stay (HOSPITAL_COMMUNITY): Payer: Medicare Other | Admitting: Occupational Therapy

## 2019-07-23 LAB — GLUCOSE, CAPILLARY
Glucose-Capillary: 231 mg/dL — ABNORMAL HIGH (ref 70–99)
Glucose-Capillary: 122 mg/dL — ABNORMAL HIGH (ref 70–99)
Glucose-Capillary: 310 mg/dL — ABNORMAL HIGH (ref 70–99)
Glucose-Capillary: 186 mg/dL — ABNORMAL HIGH (ref 70–99)

## 2019-07-23 MED ORDER — ROPINIROLE HCL 1 MG PO TABS
0.5000 mg | ORAL_TABLET | Freq: Every day | ORAL | Status: DC
  Administered 2019-07-23 – 2019-07-31 (×9): 0.5 mg via ORAL
  Filled 2019-07-23 (×9): qty 1

## 2019-07-23 MED ORDER — INSULIN ASPART PROT & ASPART (70-30 MIX) 100 UNIT/ML ~~LOC~~ SUSP
22.0000 [IU] | Freq: Two times a day (BID) | SUBCUTANEOUS | Status: DC
  Administered 2019-07-23 – 2019-07-24 (×2): 22 [IU] via SUBCUTANEOUS
  Filled 2019-07-23: qty 10

## 2019-07-23 MED ORDER — OXYBUTYNIN CHLORIDE 5 MG PO TABS
5.0000 mg | ORAL_TABLET | Freq: Every day | ORAL | Status: DC
  Administered 2019-07-23 – 2019-07-30 (×8): 5 mg via ORAL
  Filled 2019-07-23 (×8): qty 1

## 2019-07-23 MED ORDER — TOPIRAMATE 25 MG PO TABS
25.0000 mg | ORAL_TABLET | Freq: Two times a day (BID) | ORAL | Status: DC
  Administered 2019-07-23 – 2019-07-24 (×3): 25 mg via ORAL
  Filled 2019-07-23 (×3): qty 1
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
## 2019-07-23 NOTE — Progress Notes (Signed)
Physical Therapy Session Note  Patient Details  Name: Brittany [name] MRN: [id number] Date of Birth: 10-04-1960  Today's Date: 07/23/2019 PT Individual Time: 1032-1130 AND 1330-1430 PT Individual Time Calculation (min): 58 min and 60   Short Term Goals: Week 1:  PT Short Term Goal 1 (Week 1): Pt will maintain sitting balance w/functional task such as while donning shoes and socks w/supervision only PT Short Term Goal 2 (Week 1): Pt will transfer bed to wc w/LRAD and cga PT Short Term Goal 3 (Week 1): Pt will ambulate 43f w/LRAD and cga  Skilled Therapeutic Interventions/Progress Updates:   Session 1.   Pt received sitting in WC and agreeable to PT. Patient demonstrates increased fall risk as noted by score of   20 /56 on Berg Balance Scale.  (<36= high risk for falls, close to 100%; 37-45 significant >80%; 46-51 moderate >50%; 52-55 lower >25%). Pt required multiple prolonged seated rest break throughout due to fatigue and emotional lability. Emotional support provided by PT.   Pt performed step up/down 2 inch step x 4 with min assist and BUE support RW. Standing balance 2 x 30sec without AD to measure weight on scale.   Gait training with RW x 623fand 7560fith min assist, cues for decreased shoulder elevation, safety in turns, and attention to task; mild hip instability in turn. Patient returned to room and [facility] in reach and all needs met.     Session 2,   Pt received sitting in WC and agreeable to PT. Pt performed oral care at sink with supervision assist and min cues for attention to task and decreased tangential conversation.   WC mobility x 180f60fth supervision assist and cues for turning technique.   Gait training with RW over level surface in rehab gym x 150ft53f min assist, cues to improved posture to prevent forward flexion and improve AD positioning in turns. Pt then performed dynamic gait training to weave thorugh 6 cones x 2 with RW, min-mod  cues for decreased speed and cues for decreased step length in turn.  Gait training then performed without AD x 75ft 92f120ft w83fmin assist overall. Pt ntoed to maintain wide BOS and demonstrated mild inconsistent step length on the R LE, but no LOB noted.        Dynamic balance to perform reciprocal stepping over 1 inch cane on the floor. X 10 BLE with UE support on RW, 2 x 10 BLE without UE support. Min assist with RW min-mod assist without AD and moderate cues for improved weight shifting to the L to allow adequate foot clearance RLE.    Pt returned to room and performed stand pivot transfer to bed with no AD and min assist Sit>supine completed with supervision assist, and left supine in bed with call bell in reach and all needs met.        Therapy Documentation Precautions:  Precautions Precautions: Fall Restrictions Weight Bearing Restrictions: No    Vital Signs: Therapy Vitals Pulse Rate: 95 Resp: 16 BP: 111/89 Patient Position (if appropriate): Sitting Oxygen Therapy SpO2: 100 % O2 Device: Room Air Pain: denies   Therapy/Group: Individual Therapy  Pieper Kasik [name]021, 3:56 PM

## 2019-07-23 NOTE — Progress Notes (Signed)
Occupational Therapy Session Note  Patient Details  Name: Brittany [name] MRN: [id number] Date of Birth: 12-22-1960  Today's Date: 07/23/2019 OT Individual Time: 5400-8676 OT Individual Time Calculation (min): 78 min    Short Term Goals: Week 1:  OT Short Term Goal 1 (Week 1): Pt will complete toileting 3/3 tasks with Min A OT Short Term Goal 2 (Week 1): Pt will complete LB bathing at shower level with shower seat/bench with Min A OT Short Term Goal 3 (Week 1): Pt will consistently transfer to various surfaces with AD with Min A OT Short Term Goal 4 (Week 1): Pt will complete LB dressing with Min A with use of AE as needed  Skilled Therapeutic Interventions/Progress Updates:    Pt greeted at time of session supine in bed agreeable to OT session to focus on ADLs, functional transfers. Supine to sitting up at EOB with supervision, ambulated to the bathroom with CGA-Min A for RW management and performed toileting 3/3 aspects with Min A to don/doff LB clothing over hips. Pt ambulated short distance to shower bench and performed transfer and UB/LB bathing Min A, therapist assist for standing balance and 1 LOB noted in shower but occurred short distance from bench surface. Pt able to bathe BLEs but does require min A to wash buttocks for physical support in standing. Pt donned bra and underwear with Min A prior to ambulating back to her room to don pullover shirt with supervision and pants with CGA-Min A to get over hips. Oral care and hygiene at sink level with set up/supervision. Extensive time to complete entire ADL session as the patient does move slowly through her routine, becomes distracted, and does have emotional outbursts at times. Verbal cues throughout session for decreasing space between the pt and the RW, hand/foot placement, and compensatory techniques during bathing/dressing. Pt up in chair with alarm on, call bell in reach and all needs met.   Therapy Documentation Precautions:   Precautions Precautions: Fall Restrictions Weight Bearing Restrictions: No Pain: Inconsistent, pt with emotional outbursts but no clear pain location or description   Therapy/Group: Individual Therapy  Viona Gilmore 07/23/2019, 3:46 PM

## 2019-07-23 NOTE — Progress Notes (Signed)
[place] PHYSICAL MEDICINE & REHABILITATION PROGRESS NOTE   Subjective/Complaints:  Chronic HA, even prior to CVA- states she has not been diagnosed with migraine  Poor sleep nocturia x 2 at night, also feels like she needs to move legs freq at noc  ROS:  No CP, SOB, N/V/D  Objective:   No results found. Recent Labs    07/21/19 0546  WBC 6.8  HGB 13.4  HCT 40.4  PLT 459*   Recent Labs    07/21/19 0546  NA 138  K 3.7  CL 98  CO2 24  GLUCOSE 131*  BUN 13  CREATININE 0.57  CALCIUM 9.1    Intake/Output Summary (Last 24 hours) at 07/23/2019 1025 Last data filed at 07/22/2019 1700 Gross per 24 hour  Intake 500 ml  Output --  Net 500 ml     Physical Exam: Vital Signs Blood pressure (!) 119/92, pulse (!) 104, temperature 98.3 F (36.8 C), resp. rate 17, SpO2 97 %.   General: No acute distress Mood and affect are appropriate Heart: Regular rate and rhythm no rubs murmurs or extra sounds Lungs: Clear to auscultation, breathing unlabored, no rales or wheezes Abdomen: Positive bowel sounds, soft nontender to palpation, nondistended Extremities: No clubbing, cyanosis, or edema Skin: No evidence of breakdown, no evidence of rash Neurologic: Cranial nerves II through XII intact, motor strength is 5/5 in bilateral deltoid, bicep, tricep, grip, hip flexor, knee extensors, ankle dorsiflexor and plantar flexor Sensation intact to LT both feet Pedal pulses are normal  Musculoskeletal: Full range of motion in all 4 extremities. No joint swelling    Assessment/Plan: 1. 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  Physiatrist is providing close team supervision and 24 hour management of active medical problems listed below.  Physiatrist and rehab team continue to assess barriers to discharge/monitor patient progress toward functional and medical  goals  Care Tool:  Bathing    Body parts bathed by patient: Right arm, Abdomen, Chest, Left arm, Front perineal area, Left upper leg, Face, Right upper leg, Right lower leg, Left lower leg   Body parts bathed by helper: Buttocks     Bathing assist Assist Level: Minimal Assistance - Patient > 75%     Upper Body Dressing/Undressing Upper body dressing   What is the patient wearing?: Pull over shirt, Bra    Upper body assist Assist Level: Minimal Assistance - Patient > 75%    Lower Body Dressing/Undressing Lower body dressing      What is the patient wearing?: Pants, Underwear/pull up     Lower body assist Assist for lower body dressing: Moderate Assistance - Patient 50 - 74%     Toileting Toileting    Toileting assist Assist for toileting: Minimal Assistance - Patient > 75%     Transfers Chair/bed transfer  Transfers assist     Chair/bed transfer assist level: Minimal Assistance - Patient > 75%     Locomotion Ambulation   Ambulation assist      Assist level: Moderate Assistance - Patient 50 - 74% Assistive device: Hand held assist Max distance: 10   Walk 10 feet activity   Assist     Assist level: Moderate Assistance - Patient - 50 - 74% Assistive device: Hand held assist   Walk 50 feet activity   Assist Walk 50 feet with 2 turns activity did not occur: Safety/medical concerns  Walk 150 feet activity   Assist Walk 150 feet activity did not occur: Safety/medical concerns         Walk 10 feet on uneven surface  activity   Assist     Assist level: Moderate Assistance - Patient - 50 - 74% Assistive device: Aeronautical engineer Will patient use wheelchair at discharge?: No             Wheelchair 50 feet with 2 turns activity    Assist            Wheelchair 150 feet activity     Assist          Blood pressure (!) 119/92, pulse (!) 104, temperature 98.3 F (36.8 C), resp.  rate 17, SpO2 97 %.  Medical Problem List and Plan: 1.Functional and mobility deficitssecondary to left cerebellar infarct -patient maymayshower -ELOS/Goals: tent D/C 6/22 , re eval next week may progress more quickly than anticipated by therapy  2. Antithrombotics: -DVT/anticoagulation:Pharmaceutical:Lovenox -antiplatelet therapy: On low dose ASA 3.C6/C7 radiculopathy/Pain Management:s/p ESI- 3/23.Pt with notable multifactorial spondylosis in mid to lower cervical spine -add heating pad.  -utilize muscle relaxant, Muscle balm -ask therapy to address posture, ROM -Willalsocontinue Hydrocodone prn. On high dose gabpentin at night for presumed neuropathic pain but no clinical signs of a severe neuropathy will d/c and start Topirimate for HA  4. Mood:LCSW to follow for evaluation and support. -antipsychotic agents: N/A 5. Neuropsych: This patientiscapable of making decisions on herown behalf. 6. Skin/Wound Care:Routine pressure relief measures. 7. Fluids/Electrolytes/Nutrition:Monitor I/O. Check lytes in am. Low BMI with moderate protein malnutrition  8. HTN: Monitor BP tid--on Lisinoprilwith BP still poorly controlled. SBP >100's. Will likely need titration of meds. Vitals:   07/22/19 1958 07/23/19 0349  BP: 105/69 (!) 119/92  Pulse: (!) 105 (!) 104  Resp: 18 17  Temp: 98.2 F (36.8 C) 98.3 F (36.8 C)  SpO2: 95% 97%  controlled on Lotensin  9. T2DM post pancreatectomy: Poorly controlled with Hgb A1C- 12.5. Monitor BS ac/hs--poor controlled. Resume 70/30 insulin. Was not usingmetforminPTA.Will d/c Glucerna between meals as likely affecting BS and change to Ensure Max which has 5g net carbs.  CBG (last 3)  Recent Labs    07/22/19 1641 07/22/19 2046 07/23/19 0608  GLUCAP 240* 192* 231*  Adjust 70/30 dose increase to 22U BID 6/10  10 Collagen Vascular  disease/discoid Lupus: Resume Plaquenil and folic acid.  11. Pancreatic insufficiency/S/p Pancreatectomy: Resume pancrease tid ac. Will resume psyllium and loperemide prn for chronic diarrhea.  12. Dyslipidemia: On high dose statin.  13. Dizziness:Continue low dose antivert. May need vestibular evaluation. 25. H/o Major depressive disorder: Continue Wellbutrin--resume atarax bidprn(for anxiety). Has not been using Remeron. 15.Tobacco abuse: Willing to try nicotine gum--will order. 16. Vitamin B 12 deficiency: Gets IM injection every month.  17.  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  Insomnia multifactorial will add ropirinole for restless legs and oxybutnin for spastic bladder  LOS: 3 days A FACE TO FACE EVALUATION WAS PERFORMED  Charlett Blake 07/23/2019, 8:08 AM

## 2019-07-24 ENCOUNTER — Inpatient Hospital Stay (HOSPITAL_COMMUNITY): Payer: Medicare Other | Admitting: Occupational Therapy

## 2019-07-24 ENCOUNTER — Inpatient Hospital Stay (HOSPITAL_COMMUNITY): Payer: Medicare Other | Admitting: Physical Therapy

## 2019-07-24 ENCOUNTER — Encounter (HOSPITAL_COMMUNITY): Payer: Medicare Other | Admitting: Psychology

## 2019-07-24 ENCOUNTER — Inpatient Hospital Stay (HOSPITAL_COMMUNITY): Payer: Medicare Other | Admitting: Speech Pathology

## 2019-07-24 LAB — GLUCOSE, CAPILLARY
Glucose-Capillary: 272 mg/dL — ABNORMAL HIGH (ref 70–99)
Glucose-Capillary: 56 mg/dL — ABNORMAL LOW (ref 70–99)
Glucose-Capillary: 98 mg/dL (ref 70–99)
Glucose-Capillary: 340 mg/dL — ABNORMAL HIGH (ref 70–99)
Glucose-Capillary: 263 mg/dL — ABNORMAL HIGH (ref 70–99)

## 2019-07-24 MED ORDER — INSULIN ASPART PROT & ASPART (70-30 MIX) 100 UNIT/ML ~~LOC~~ SUSP
25.0000 [IU] | Freq: Two times a day (BID) | SUBCUTANEOUS | Status: DC
  Administered 2019-07-24 – 2019-07-26 (×4): 25 [IU] via SUBCUTANEOUS
  Filled 2019-07-24: qty 10

## 2019-07-24 MED ORDER — TOPIRAMATE 25 MG PO TABS
50.0000 mg | ORAL_TABLET | Freq: Two times a day (BID) | ORAL | Status: DC
  Administered 2019-07-24 – 2019-08-02 (×19): 50 mg via ORAL
  Filled 2019-07-24 (×19): qty 2

## 2019-07-24 NOTE — Progress Notes (Signed)
Occupational Therapy Session Note  Patient Details  Name: Brittany [name] MRN: [id number] Date of Birth: 1960-03-15  Today's Date: 07/24/2019 OT Individual Time: 9090-3014 OT Individual Time Calculation (min): 56 min    Short Term Goals: Week 1:  OT Short Term Goal 1 (Week 1): Pt will complete toileting 3/3 tasks with Min A OT Short Term Goal 2 (Week 1): Pt will complete LB bathing at shower level with shower seat/bench with Min A OT Short Term Goal 3 (Week 1): Pt will consistently transfer to various surfaces with AD with Min A OT Short Term Goal 4 (Week 1): Pt will complete LB dressing with Min A with use of AE as needed  Skilled Therapeutic Interventions/Progress Updates:    Pt greeted at time of session in bed, c/o head pain which is typical for the pt and she being medically managed. Pt required extended time to get OOB with significant encouragement, pt very preoccupied with her bad dreams at night, not sleeping well, significant fatigue, etc. Pt did transition supine to sitting up at EOB with CGA with instructions to roll onto side and push through UE closest to bed surface. Once sitting up at EOB pt with some dizziness that subsided with sitting for short period of time. Attempted walking throughout her room toward gym but pt able to tolerate approx 3-5 feet before sitting in chair and therapist brought her to gym, performed transfers to/from mat with Min/Mod A with hand held assist. Pt performed dynamic seated activities with 1# dowel to hit various objects to improve core strength/trunk stability and reaction speed. BUE strengthening using 3# weights and downgraded to 1# dowel for bicep curls, overhead press, forward circles. Pt fatigued quickly this date, declining to ambulate. Returned to her room and stand pivot to bed hand held assist Min A. Pt returned to bed in supine, call bell in reach, all needs met.  Therapy Documentation Precautions:  Precautions Precautions:  Fall Restrictions Weight Bearing Restrictions: No   Therapy/Group: Individual Therapy  Viona Gilmore 07/24/2019, 3:51 PM

## 2019-07-24 NOTE — Consult Note (Signed)
  Patient asleep and curled up to left side of bed.  Hard to wake and patient stated that patient had very bad HA, which is a chronic condition, but that she did not want to talk today.  We will try to r/s visit on another day.

## 2019-07-24 NOTE — Plan of Care (Signed)
  Problem: Consults Goal: RH STROKE PATIENT EDUCATION Description: See Patient Education module for education specifics  Outcome: Progressing   Problem: RH BOWEL ELIMINATION Goal: RH STG MANAGE BOWEL WITH ASSISTANCE Description: STG Manage Bowel with Mod I Assistance. Outcome: Progressing   Problem: RH SAFETY Goal: RH STG ADHERE TO SAFETY PRECAUTIONS W/ASSISTANCE/DEVICE Description: STG Adhere to Safety Precautions With Supervision Assistance/Device. Outcome: Progressing   Problem: RH COGNITION-NURSING Goal: RH STG ANTICIPATES NEEDS/CALLS FOR ASSIST W/ASSIST/CUES Description: STG Anticipates Needs/Calls for Assist With Supervision Assistance/Cues. Outcome: Progressing   Problem: RH KNOWLEDGE DEFICIT Goal: RH STG INCREASE KNOWLEDGE OF DIABETES Description: Pt will verbalize understanding of diabetes management including diet, exercise, and medication management with cues/reminders assist/cues using educational materials provided by staff.  Outcome: Progressing Goal: RH STG INCREASE KNOWLEDGE OF STROKE PROPHYLAXIS Description: Pt will be able to verbalize stroke risk factors and prevention techniques including smoking cessation, diet management, blood sugar control, and follow up care with physician with cues/reminders assist/cues using educational materials provided by staff.  Outcome: Progressing

## 2019-07-24 NOTE — IPOC Note (Signed)
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[place]) Active Problems:   Malnutrition of moderate degree     Functional Problem List: Nursing Endurance, Motor, Medication Management, Sensory  PT Balance, Endurance, Motor, Safety, Sensory  OT Balance, Motor, Pain, Safety  SLP Cognition  TR         Basic ADL's: OT Grooming, Bathing, Toileting     Advanced  ADL's: OT       Transfers: PT Bed Mobility, Bed to Chair, Car, Patent attorney, Agricultural engineer: PT Ambulation, Stairs     Additional Impairments: OT    SLP Social Cognition   Problem Solving, Memory  TR      Anticipated Outcomes Item Anticipated Outcome  Self Feeding Independent  Swallowing      Basic self-care  Supervision-Mod I  Toileting  Supervision-Mod I   Bathroom Transfers Supervision-Mod I  Bowel/Bladder  Pt will manage bowel and bladder with supervision assist  Transfers  mod i  Locomotion  mod i  Communication     Cognition  Mod I  Pain  Pt will manage pain at 3 or less on a scale of 0-10.  Safety/Judgment  Pt will remain free of falls with injury while in rehab with min assist   Therapy Plan: PT Intensity: Minimum of 1-2 x/day ,45 to 90 minutes PT Frequency: 5 out of 7 days PT Duration Estimated Length of Stay: 12-14 days OT Intensity: Minimum of 1-2 x/day, 45 to 90 minutes OT Frequency: 5 out of 7 days OT Duration/Estimated Length of Stay: 2 weeks SLP Intensity: Minumum of 1-2 x/day, 30 to 90 minutes SLP Frequency: 3 to 5 out of 7 days SLP Duration/Estimated Length of Stay: 08/04/19 (11 days)   Due to the current state of emergency, patients may not be receiving their 3-hours of Medicare-mandated therapy.   Team Interventions: Nursing  Interventions Patient/Family Education, Disease Management/Prevention, Medication Management, Discharge Planning  PT interventions Ambulation/gait training, Discharge planning, Functional mobility training, Therapeutic Activities, Balance/vestibular training, Disease management/prevention, Neuromuscular re-education, Therapeutic Exercise, Wheelchair propulsion/positioning, DME/adaptive equipment instruction, UE/LE Strength taining/ROM, Patient/family education, Stair training, UE/LE Coordination activities  OT Interventions Balance/vestibular training, Discharge planning, Pain management, Self Care/advanced ADL retraining, Therapeutic Activities, UE/LE Coordination activities, Disease mangement/prevention, Functional mobility training, Patient/family education, Therapeutic Exercise, Visual/perceptual remediation/compensation, DME/adaptive equipment instruction, Neuromuscular re-education, Psychosocial support, UE/LE Strength taining/ROM  SLP Interventions Cognitive remediation/compensation, Cueing hierarchy, Functional tasks, Therapeutic Activities, Patient/family education, Internal/external aids  TR Interventions    SW/CM Interventions Psychosocial Support, Patient/Family Education, Discharge Planning   Barriers to Discharge MD  Medical stability and Behavior  Nursing Decreased caregiver support    PT Decreased caregiver support, Home environment access/layout limited support, 2 STE  OT Decreased caregiver support, Medical stability    SLP      SW Medical stability     Team Discharge Planning: Destination: PT-Home ,OT- Home , SLP-Home Projected Follow-up: PT-Home health PT, OT-  Home health OT, SLP-Outpatient SLP Projected Equipment Needs: PT-To be determined, OT- 3 in 1 bedside comode, Tub/shower seat, SLP-None recommended by SLP Equipment Details: PT- , OT-unofficial, further needs TBD Patient/family involved in discharge planning: PT- Patient,  OT-Patient, SLP-Patient  MD ELOS:  10-14d Medical Rehab Prognosis:  Fair Assessment:  59 year old female with history of T2DM,  depression, discoid Lupus, tobacco use who was admitted to [facility] on 07/12/19 with 3 day history of N/V and dizziness. She was found to have DKA with BS 406 and was treated with IV insulin and fluid bolus. 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 2D echo with bubble study showed EF 55-60% with mild LVH, moderately elevated pulmonary pressure and was negative for shunt. Stroke felt to be due to small vessel disease and ASA recommended by Dr. Doy Mince. She developed HA with follow up [place] showing increase in edema and hydrocodone added for pain management. Headaches improving but she continues to have tachycardia as well as dizziness affecting mobility and ADLs   Now requiring 24/7 Rehab RN,MD, as well as CIR level PT, OT and SLP.  Treatment team will focus on ADLs and mobility with goals set at Sup See Team Conference Notes for weekly updates to the plan of care

## 2019-07-24 NOTE — Progress Notes (Signed)
Physical Therapy Session Note  Patient Details  Name: Brittany [name] MRN: [id number] Date of Birth: 1960-04-03  Today's Date: 07/24/2019 PT Individual Time: 8835-8446 PT Individual Time Calculation (min): 53 min   Short Term Goals: Week 1:  PT Short Term Goal 1 (Week 1): Pt will maintain sitting balance w/functional task such as while donning shoes and socks w/supervision only PT Short Term Goal 2 (Week 1): Pt will transfer bed to wc w/LRAD and cga PT Short Term Goal 3 (Week 1): Pt will ambulate 63f w/LRAD and cga  Skilled Therapeutic Interventions/Progress Updates:   Pt received supine in bed and agreeable to PT, after significant encouragement. Supine>sit transfer with min assist moderate cues for participation. Pt extremely labile on this day and tearful throughout session, but agreeable to attempt therapy. [facility] and [facility] assist, very slow, small steps with no change following instruction from PT. [facility] S[facility] attention and fine motor task of wii bowling. 2 frames sitting, 2 frames standing and 2 additional frames seated. Moderate cues for technique, sequencing, and improved coordination. Pt able to only tolerate 2 frames in standing, then requesting to sit back down due to dizziness and fagitue. No other s/s of hypotenison throughout session. Pt returned to room and performed ambulatory transfer to bed with CGA and RW x 162f Sit>supine completed with supervision assist, and left supine in bed with call bell in reach and all needs met.       Therapy Documentation Precautions:  Precautions Precautions: Fall Restrictions Weight Bearing Restrictions: No    Pain: Pain Assessment Pain Scale: 0-10 Pain Score: 4  Pain Type: Chronic pain Pain Location: Head Pain Descriptors / Indicators: Headache Pain Onset: On-going Pain Intervention(s): Other (Comment);Distraction;Emotional support (pt already received pain meds) Multiple  Pain Sites: No    Therapy/Group: Individual Therapy  AuLorie Phenix/12/2019, 2:03 PM

## 2019-07-24 NOTE — Significant Event (Signed)
Hypoglycemic Event  CBG: 56  Treatment:Pt was given two eight ounces of orange juice  Symptoms: Pt awake and alert. No visual symptoms of hypoglycemia. Only sign present was blood glucose reading at 56  Follow-up CBG: Time:1145 CBG Result:98  Possible Reasons for Event:Pt states that blood glucose greatly fluctuates and that she stated that she did not feel okay until she got out of bed. Comments/MD notified:Pam PA    Christella Scheuermann Jayme Mednick LPN

## 2019-07-24 NOTE — Progress Notes (Signed)
[place] PHYSICAL MEDICINE & REHABILITATION PROGRESS NOTE   Subjective/Complaints:  Chronic HA, even prior to CVA- states she has not been diagnosed with migraine  Sleeping but tearful when awakened.  Refused to talk to Neuropsych this am, encouraged pt to do so   ROS:  No CP, SOB, N/V/D  Objective:   No results found. No results for input(s): WBC, HGB, HCT, PLT in the last 72 hours. No results for input(s): NA, K, CL, CO2, GLUCOSE, BUN, CREATININE, CALCIUM in the last 72 hours.  Intake/Output Summary (Last 24 hours) at 07/24/2019 0846 Last data filed at 07/23/2019 1700 Gross per 24 hour  Intake 480 ml  Output --  Net 480 ml     Physical Exam: Vital Signs Blood pressure 120/74, pulse 96, temperature 98.7 F (37.1 C), resp. rate 18, SpO2 97 %.  General: No acute distress Mood and affect are appropriate Heart: Regular rate and rhythm no rubs murmurs or extra sounds Lungs: Clear to auscultation, breathing unlabored, no rales or wheezes Abdomen: Positive bowel sounds, soft nontender to palpation, nondistended Extremities: No clubbing, cyanosis, or edema Skin: No evidence of breakdown, no evidence of rash Neurologic: Cranial nerves II through XII intact, motor strength is 5/5 in bilateral deltoid, bicep, tricep, grip, hip flexor, knee extensors, ankle dorsiflexor and plantar flexor Sensation intact to LT both feet Pedal pulses are normal  Musculoskeletal: Full range of motion in all 4 extremities. No joint swelling    Assessment/Plan: 1. 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  Physiatrist is providing close team supervision and 24 hour management of active medical problems listed below.  Physiatrist and rehab team continue to assess barriers to discharge/monitor patient progress toward functional and medical goals  Care Tool:  Bathing    Body  parts bathed by patient: Right arm, Abdomen, Chest, Left arm, Front perineal area, Left upper leg, Face, Right upper leg, Right lower leg, Left lower leg   Body parts bathed by helper: Buttocks     Bathing assist Assist Level: Minimal Assistance - Patient > 75%     Upper Body Dressing/Undressing Upper body dressing   What is the patient wearing?: Pull over shirt, Bra (Min for bra supervision for shirt)    Upper body assist Assist Level: Minimal Assistance - Patient > 75%    Lower Body Dressing/Undressing Lower body dressing      What is the patient wearing?: Pants, Underwear/pull up     Lower body assist Assist for lower body dressing: Minimal Assistance - Patient > 75%     Toileting Toileting    Toileting assist Assist for toileting: Minimal Assistance - Patient > 75%     Transfers Chair/bed transfer  Transfers assist     Chair/bed transfer assist level: Minimal Assistance - Patient > 75%     Locomotion Ambulation   Ambulation assist      Assist level: Moderate Assistance - Patient 50 - 74% Assistive device: Hand held assist Max distance: 10   Walk 10 feet activity   Assist     Assist level: Moderate Assistance - Patient - 50 - 74% Assistive device: Hand held assist   Walk 50 feet activity   Assist Walk 50 feet with 2 turns activity did not occur: Safety/medical concerns         Walk 150 feet activity   Assist Walk 150 feet activity did not occur: Safety/medical concerns  Walk 10 feet on uneven surface  activity   Assist     Assist level: Moderate Assistance - Patient - 50 - 74% Assistive device: Aeronautical engineer Will patient use wheelchair at discharge?: No             Wheelchair 50 feet with 2 turns activity    Assist            Wheelchair 150 feet activity     Assist          Blood pressure 120/74, pulse 96, temperature 98.7 F (37.1 C), resp. rate 18, SpO2 97  %.  Medical Problem List and Plan: 1.Functional and mobility deficitssecondary to left cerebellar infarct -patient maymayshower -ELOS/Goals: tent D/C 6/22 ,inconsistent participation  2. Antithrombotics: -DVT/anticoagulation:Pharmaceutical:Lovenox -antiplatelet therapy: On low dose ASA 3.C6/C7 radiculopathy/Pain Management:s/p ESI- 3/23.Pt with notable multifactorial spondylosis in mid to lower cervical spine -add heating pad.  -utilize muscle relaxant, Muscle balm -ask therapy to address posture, ROM -Willalsocontinue Hydrocodone prn. On high dose gabpentin at night for presumed neuropathic pain but no clinical signs of a severe neuropathy will d/c and start Topirimate for HA  4. Mood:LCSW to follow for evaluation and support. -antipsychotic agents: N/A 5. Neuropsych: This patientiscapable of making decisions on herown behalf. 6. Skin/Wound Care:Routine pressure relief measures. 7. Fluids/Electrolytes/Nutrition:Monitor I/O. Check lytes in am. Low BMI with moderate protein malnutrition  8. HTN: Monitor BP tid--on Lisinoprilwith BP still poorly controlled. SBP >100's. Will likely need titration of meds. Vitals:   07/23/19 1948 07/24/19 0548  BP: (!) 118/97 120/74  Pulse: 100 96  Resp: 18 18  Temp:  98.7 F (37.1 C)  SpO2: 96% 97%  controlled on Lotensin  9. T2DM post pancreatectomy: Poorly controlled with Hgb A1C- 12.5. Monitor BS ac/hs--poor controlled. Resume 70/30 insulin. Was not usingmetforminPTA.Will d/c Glucerna between meals as likely affecting BS and change to Ensure Max which has 5g net carbs.  CBG (last 3)  Recent Labs    07/23/19 1657 07/23/19 2107 07/24/19 0549  GLUCAP 310* 186* 272*  Adjust 70/30 dose increase to 22U BID 6/10, to 25U 6/1  10 Collagen Vascular disease/discoid Lupus: Resume Plaquenil and folic acid.  11. Pancreatic  insufficiency/S/p Pancreatectomy: Resume pancrease tid ac. Will resume psyllium and loperemide prn for chronic diarrhea.  12. Dyslipidemia: On high dose statin.  13. Dizziness:Continue low dose antivert. May need vestibular evaluation. 49. H/o Major depressive disorder: Continue Wellbutrin--resume atarax bidprn(for anxiety). Has not been using Remeron. 15.Tobacco abuse: Willing to try nicotine gum--will order. 16. Vitamin B 12 deficiency: Gets IM injection every month.  17.  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  Insomnia multifactorial will add ropirinole for restless legs and oxybutnin for spastic bladder  LOS: 4 days A FACE TO FACE EVALUATION WAS PERFORMED  Charlett Blake 07/24/2019, 8:46 AM

## 2019-07-25 ENCOUNTER — Inpatient Hospital Stay (HOSPITAL_COMMUNITY): Payer: Medicare Other

## 2019-07-25 ENCOUNTER — Inpatient Hospital Stay (HOSPITAL_COMMUNITY): Payer: Medicare Other | Admitting: Occupational Therapy

## 2019-07-25 ENCOUNTER — Inpatient Hospital Stay (HOSPITAL_COMMUNITY): Payer: Medicare Other | Admitting: Physical Therapy

## 2019-07-25 LAB — GLUCOSE, CAPILLARY
Glucose-Capillary: 261 mg/dL — ABNORMAL HIGH (ref 70–99)
Glucose-Capillary: 139 mg/dL — ABNORMAL HIGH (ref 70–99)
Glucose-Capillary: 141 mg/dL — ABNORMAL HIGH (ref 70–99)
Glucose-Capillary: 344 mg/dL — ABNORMAL HIGH (ref 70–99)

## 2019-07-25 NOTE — Progress Notes (Signed)
[place] PHYSICAL MEDICINE & REHABILITATION PROGRESS NOTE   Subjective/Complaints:  Anxious this morning because she thought her OT was a female. Didn't realize she misread her schedule and it was PT. Brightened up after that  ROS: Patient denies fever, rash, sore throat, blurred vision, nausea, vomiting, diarrhea, cough, shortness of breath or chest pain, joint or back pain, or mood change.    Objective:   No results found. No results for input(s): WBC, HGB, HCT, PLT in the last 72 hours. No results for input(s): NA, K, CL, CO2, GLUCOSE, BUN, CREATININE, CALCIUM in the last 72 hours.  Intake/Output Summary (Last 24 hours) at 07/25/2019 1136 Last data filed at 07/25/2019 0900 Gross per 24 hour  Intake 352 ml  Output --  Net 352 ml     Physical Exam: Vital Signs Blood pressure 105/65, pulse (!) 106, temperature 98.1 F (36.7 C), resp. rate 18, SpO2 98 %.  Constitutional: No distress . Vital signs reviewed. HEENT: EOMI, oral membranes moist Neck: supple Cardiovascular: RRR without murmur. No JVD    Respiratory/Chest: CTA Bilaterally without wheezes or rales. Normal effort    GI/Abdomen: BS +, non-tender, non-distended Ext: no clubbing, cyanosis, or edema Psych: pleasant and cooperative Skin: No evidence of breakdown, no evidence of rash Neurologic: Cranial nerves II through XII intact, motor strength is 5/5 in bilateral deltoid, bicep, tricep, grip, hip flexor, knee extensors, ankle dorsiflexor and plantar flexor Sensation intact to LT both feet. Good sitting balance Pedal pulses are normal  Musculoskeletal: Full range of motion in all 4 extremities. No joint swelling    Assessment/Plan: 1. 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  Physiatrist is providing close team supervision and 24 hour management of active medical problems listed  below.  Physiatrist and rehab team continue to assess barriers to discharge/monitor patient progress toward functional and medical goals  Care Tool:  Bathing    Body parts bathed by patient: Right arm, Abdomen, Chest, Left arm, Front perineal area, Left upper leg, Face, Right upper leg, Right lower leg, Left lower leg   Body parts bathed by helper: Buttocks     Bathing assist Assist Level: Minimal Assistance - Patient > 75%     Upper Body Dressing/Undressing Upper body dressing   What is the patient wearing?: Pull over shirt, Bra (Min for bra supervision for shirt)    Upper body assist Assist Level: Minimal Assistance - Patient > 75%    Lower Body Dressing/Undressing Lower body dressing      What is the patient wearing?: Pants, Underwear/pull up     Lower body assist Assist for lower body dressing: Minimal Assistance - Patient > 75%     Toileting Toileting    Toileting assist Assist for toileting: Minimal Assistance - Patient > 75%     Transfers Chair/bed transfer  Transfers assist     Chair/bed transfer assist level: Minimal Assistance - Patient > 75%     Locomotion Ambulation   Ambulation assist      Assist level: Moderate Assistance - Patient 50 - 74% Assistive device: Hand held assist Max distance: 10   Walk 10 feet activity   Assist     Assist level: Moderate Assistance - Patient - 50 - 74% Assistive device: Hand held assist   Walk 50 feet activity   Assist Walk 50 feet with 2 turns activity did not occur: Safety/medical concerns  Walk 150 feet activity   Assist Walk 150 feet activity did not occur: Safety/medical concerns         Walk 10 feet on uneven surface  activity   Assist     Assist level: Moderate Assistance - Patient - 50 - 74% Assistive device: Aeronautical engineer Will patient use wheelchair at discharge?: No             Wheelchair 50 feet with 2 turns  activity    Assist            Wheelchair 150 feet activity     Assist          Blood pressure 105/65, pulse (!) 106, temperature 98.1 F (36.7 C), resp. rate 18, SpO2 98 %.  Medical Problem List and Plan: 1.Functional and mobility deficitssecondary to left cerebellar infarct -patient maymayshower -ELOS/Goals: tent D/C 6/22 ,inconsistent participation  2. Antithrombotics: -DVT/anticoagulation:Pharmaceutical:Lovenox -antiplatelet therapy: On low dose ASA 3.C6/C7 radiculopathy/Pain Management:s/p ESI- 3/23.Pt with notable multifactorial spondylosis in mid to lower cervical spine -add heating pad.  -utilize muscle relaxant, Muscle balm - therapy addressing posture, ROM -Willalsocontinue Hydrocodone prn.  On high dose gabpentin at night for presumed neuropathic pain but no clinical signs of a severe neuropathy this was stopped.    -continue Topirimate for HA  4. Mood:LCSW to follow for evaluation and support. -antipsychotic agents: N/A 5. Neuropsych: This patientiscapable of making decisions on herown behalf. 6. Skin/Wound Care:Routine pressure relief measures. 7. Fluids/Electrolytes/Nutrition:Monitor I/O. Check lytes in am. Low BMI with moderate protein malnutrition  8. HTN: Monitor BP tid--on Lisinoprilwith BP still poorly controlled. SBP >100's. Will likely need titration of meds. Vitals:   07/24/19 2100 07/25/19 0418  BP: (!) 117/94 105/65  Pulse: (!) 102 (!) 106  Resp: 19 18  Temp: 97.7 F (36.5 C) 98.1 F (36.7 C)  SpO2: 99% 98%  controlled on Lotensin 6/12 9. T2DM post pancreatectomy: Poorly controlled with Hgb A1C- 12.5. Monitor BS ac/hs--poor controlled. Resume 70/30 insulin. Was not usingmetforminPTA.Will d/c Glucerna between meals as likely affecting BS and change to Ensure Max which has 5g net carbs.  CBG (last 3)  Recent Labs     07/24/19 1636 07/24/19 2101 07/25/19 0605  GLUCAP 340* 263* 261*  Adjusted 70/30 dose increase to 22U BID 6/10, to 25U 6/11   -6/12 observe for response today 10 Collagen Vascular disease/discoid Lupus: Resume Plaquenil and folic acid.  11. Pancreatic insufficiency/S/p Pancreatectomy: Resume pancrease tid ac. Will resume psyllium and loperemide prn for chronic diarrhea.  12. Dyslipidemia: On high dose statin.  13. Dizziness:Continue low dose antivert. May need vestibular evaluation. 68. H/o Major depressive disorder: Continue Wellbutrin--resume atarax bidprn(for anxiety). Has not been using Remeron. 15.Tobacco abuse: Willing to try nicotine gum--will order. 16. Vitamin B 12 deficiency: Gets IM injection every month.  17.  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  Insomnia multifactorial-added ropirinole for restless legs and oxybutnin for spastic bladder --?some improvement  LOS: 5 days A FACE TO FACE EVALUATION WAS PERFORMED  [name] 07/25/2019, 11:36 AM

## 2019-07-25 NOTE — Progress Notes (Signed)
Pt very tearful and emotional during shift. Pt spoke with charge nurse about situation. Charge nurse York Cerise) recommends pt may benefit from neuro/psych evaluation.

## 2019-07-25 NOTE — Progress Notes (Signed)
Physical Therapy Session Note  Patient Details  Name: Brittany [name] MRN: [id number] Date of Birth: December 22, 1960  Today's Date: 07/25/2019 PT Individual Time: 0810-0905 PT Individual Time Calculation (min): 55 min   Short Term Goals: Week 1:  PT Short Term Goal 1 (Week 1): Pt will maintain sitting balance w/functional task such as while donning shoes and socks w/supervision only PT Short Term Goal 2 (Week 1): Pt will transfer bed to wc w/LRAD and cga PT Short Term Goal 3 (Week 1): Pt will ambulate 43f w/LRAD and cga  Skilled Therapeutic Interventions/Progress Updates:   Pt received sitting EOB with MD and RN present, once meds distributed, pt agreeable to PT. Max [facility] transfer to WC. Pt reports lightheadedness. PT appliued knee high Teds. WC mobility instructed by PT x 158fwith supervision assist and min cues for turning technique. Orthostatic vital assessed. Sitting 117/100. Standing 128/103 HR 100bpm (Of note: no pediatric BP cuffs were available, and pt's arm is too small for adult size). Pt reports dizziness reduces after 30 sec standing gait training with supervision assist x [id number] gait training pt extremely tearful repeatedly stating that she hates being this dependent on people to move, but only supervision assist provided throughout. Pt returned to room and performed stand pivot transfer to bed with RW and supervision assist and increased time to initiate transfer. Sit>supine completed without assist, and left supine in bed with call bell in reach and all needs met. Pt demonstrated severe emotional lability throughout treatment requiring therapeutic use of self to calm pt down continuously.            Therapy Documentation Precautions:  Precautions Precautions: Fall Restrictions Weight Bearing Restrictions: No Pain: Pain Assessment Pain Scale: 0-10 Pain Score: 8  Faces Pain Scale: Hurts worst Pain Type: Acute  pain Pain Location: Head Pain Orientation: Other (Comment) (frontal headache) Pain Descriptors / Indicators: Headache Pain Onset: On-going Patients Stated Pain Goal: 0 Pain Intervention(s): Repositioned;Distraction (pt requested no pain medication)    Therapy/Group: Individual Therapy  AusLorie Phenix12/2021, 12:26 PM

## 2019-07-25 NOTE — Progress Notes (Signed)
Speech Language Pathology Daily Session Note  Patient Details  Name: Brittany [name] MRN: [id number] Date of Birth: 09-28-1960  Today's Date: 07/25/2019 SLP Individual Time: 1102-1205 SLP Individual Time Calculation (min): 63 min  Short Term Goals: Week 1: SLP Short Term Goal 1 (Week 1): Pt will use compensatory memory strategies and/or aids to recall new complex information with Supervision A verbal/visual cues. SLP Short Term Goal 2 (Week 1): Pt will demonstrate ability to problem solve mildly complex functional tasks with Min A verbal/visual cues. SLP Short Term Goal 3 (Week 1): Pt will anticipate 2 tasks she will need assistance with at d/c based on current level of functioning with Min A cues.  Skilled Therapeutic Interventions: Skilled ST services focused on cognitive skills. Pt was able to recall what appeared to be most of medication consumed prior to hospitalization. SLP facilitated verbal complex problem solving and recall utilizing current medication, pt required min A verbal cues for verbal problem solving (times per day) and supervision A verbal cues to recall medication name/funticon /times per day with visual aid. Pt expressed headache pain but denied need for medication and was agreeable to participate in pill organization task after going to the bathroom per pt request. Pt required min A for transfer with WC. Pt became emotional and started crying about family support, SLP and NT provided emotional support. Pill organizer was not completed due to limited time.  Pt was left in room with call bell within reach and bed alarm set. ST recommends to continue skilled ST services.      Pain Pain Assessment Pain Scale: 0-10 Pain Score: 8  Faces Pain Scale: Hurts worst Pain Type: Acute pain Pain Location: Head Pain Orientation: Other (Comment) (frontal headache) Pain Descriptors / Indicators: Headache Pain Onset: On-going Patients Stated Pain Goal: 0 Pain Intervention(s):  Repositioned;Distraction (pt requested no pain medication)  Therapy/Group: Individual Therapy  Kota Ciancio  [facility] 07/25/2019, 12:20 PM

## 2019-07-25 NOTE — Plan of Care (Signed)
°  Problem: Consults Goal: RH STROKE PATIENT EDUCATION Description: See Patient Education module for education specifics  Outcome: Progressing   Problem: RH BOWEL ELIMINATION Goal: RH STG MANAGE BOWEL WITH ASSISTANCE Description: STG Manage Bowel with Mod I Assistance. Outcome: Progressing   Problem: RH SAFETY Goal: RH STG ADHERE TO SAFETY PRECAUTIONS W/ASSISTANCE/DEVICE Description: STG Adhere to Safety Precautions With Supervision Assistance/Device. Outcome: Progressing   Problem: RH COGNITION-NURSING Goal: RH STG ANTICIPATES NEEDS/CALLS FOR ASSIST W/ASSIST/CUES Description: STG Anticipates Needs/Calls for Assist With Supervision Assistance/Cues. Outcome: Progressing   Problem: RH KNOWLEDGE DEFICIT Goal: RH STG INCREASE KNOWLEDGE OF DIABETES Description: Pt will verbalize understanding of diabetes management including diet, exercise, and medication management with cues/reminders assist/cues using educational materials provided by staff.  Outcome: Progressing Goal: RH STG INCREASE KNOWLEDGE OF STROKE PROPHYLAXIS Description: Pt will be able to verbalize stroke risk factors and prevention techniques including smoking cessation, diet management, blood sugar control, and follow up care with physician with cues/reminders assist/cues using educational materials provided by staff.  Outcome: Progressing

## 2019-07-25 NOTE — Progress Notes (Signed)
Occupational Therapy Session Note  Patient Details  Name: Brittany [name] MRN: [id number] Date of Birth: [birth year]  Today's Date: 07/25/2019 OT Individual Time: 9163-8466 OT Individual Time Calculation (min): 84 min   Short Term Goals: Week 1:  OT Short Term Goal 1 (Week 1): Pt will complete toileting 3/3 tasks with Min A OT Short Term Goal 2 (Week 1): Pt will complete LB bathing at shower level with shower seat/bench with Min A OT Short Term Goal 3 (Week 1): Pt will consistently transfer to various surfaces with AD with Min A OT Short Term Goal 4 (Week 1): Pt will complete LB dressing with Min A with use of AE as needed  Skilled Therapeutic Interventions/Progress Updates:    Pt greeted in bed with c/o HA and neck pain. RN in at start of session to provide tylenol. Supine<sit completed with Mod A. While EOB pt ate her lunch, using both UEs to set up the condiments. It took a great deal of emotional support and encouragement for pt to finish her meal, upset about a conversation with a family member earlier and also c/o that her pain was "changing sides." Stand pivot<w/c completed with CGA using RW. After she was escorted to the family room, CGA for standing balance while pt prepared her coffee with cues. Pt was returned to the room, wanting to engage in bathing/dressing tasks at the sink. She required setup for UB self care and was left in care of RN to resume with LB bathing/dressing. Tx focus placed on NMR, ADL retraining, and dynamic standing balance.   Therapy Documentation Precautions:  Precautions Precautions: Fall Restrictions Weight Bearing Restrictions: No Vital Signs: Therapy Vitals Temp: 97.9 F (36.6 C) Temp Source: Oral Pulse Rate: 96 Resp: 14 BP: 103/79 Patient Position (if appropriate): Lying Oxygen Therapy SpO2: 100 % O2 Device: Room Air Pain: Pain Assessment Faces Pain Scale: Hurts worst Pain Type: Acute pain Pain Location: Head Pain Descriptors / Indicators:  Headache Pain Onset: On-going Pain Intervention(s): Repositioned;Distraction (pt requested no pain medication) ADL: ADL Eating: Set up Where Assessed-Eating: Chair Grooming: Supervision/safety Where Assessed-Grooming: Sitting at sink Upper Body Bathing: Contact guard Lower Body Bathing: Moderate assistance Upper Body Dressing: Contact guard Lower Body Dressing: Moderate assistance Toileting: Moderate assistance Toilet Transfer: Moderate assistance (Min-Mod) Toilet Transfer Method: Stand pivot Tub/Shower Transfer: Moderate assistance (hand held assist)     Therapy/Group: Individual Therapy  Enrica Corliss A Elani Delph 07/25/2019, 3:57 PM

## 2019-07-26 ENCOUNTER — Inpatient Hospital Stay (HOSPITAL_COMMUNITY): Payer: Medicare Other | Admitting: Occupational Therapy

## 2019-07-26 LAB — GLUCOSE, CAPILLARY
Glucose-Capillary: 232 mg/dL — ABNORMAL HIGH (ref 70–99)
Glucose-Capillary: 132 mg/dL — ABNORMAL HIGH (ref 70–99)
Glucose-Capillary: 158 mg/dL — ABNORMAL HIGH (ref 70–99)
Glucose-Capillary: 66 mg/dL — ABNORMAL LOW (ref 70–99)
Glucose-Capillary: 359 mg/dL — ABNORMAL HIGH (ref 70–99)

## 2019-07-26 MED ORDER — ALPRAZOLAM 0.25 MG PO TABS
0.2500 mg | ORAL_TABLET | Freq: Three times a day (TID) | ORAL | Status: DC | PRN
  Administered 2019-07-26 – 2019-07-31 (×4): 0.25 mg via ORAL
  Filled 2019-07-26 (×5): qty 1

## 2019-07-26 MED ORDER — INSULIN ASPART PROT & ASPART (70-30 MIX) 100 UNIT/ML ~~LOC~~ SUSP
28.0000 [IU] | Freq: Two times a day (BID) | SUBCUTANEOUS | Status: DC
  Administered 2019-07-27 – 2019-08-04 (×15): 28 [IU] via SUBCUTANEOUS

## 2019-07-26 MED ORDER — GABAPENTIN 300 MG PO CAPS
300.0000 mg | ORAL_CAPSULE | Freq: Every day | ORAL | Status: DC
  Administered 2019-07-26 – 2019-08-02 (×8): 300 mg via ORAL
  Filled 2019-07-26 (×8): qty 1

## 2019-07-26 NOTE — Progress Notes (Addendum)
[place] PHYSICAL MEDICINE & REHABILITATION PROGRESS NOTE   Subjective/Complaints:  Pt remains emotionally labile. Can be redirected sometimes easier than others  ROS: Patient denies fever, rash, sore throat, blurred vision, nausea, vomiting, diarrhea, cough, shortness of breath or chest pain, joint or back pain, headache .    Objective:   No results found. No results for input(s): WBC, HGB, HCT, PLT in the last 72 hours. No results for input(s): NA, K, CL, CO2, GLUCOSE, BUN, CREATININE, CALCIUM in the last 72 hours.  Intake/Output Summary (Last 24 hours) at 07/26/2019 1053 Last data filed at 07/26/2019 0831 Gross per 24 hour  Intake 360 ml  Output --  Net 360 ml     Physical Exam: Vital Signs Blood pressure 124/85, pulse (!) 108, temperature 98.6 F (37 C), resp. rate 19, SpO2 100 %.  Constitutional: No distress . Vital signs reviewed. HEENT: EOMI, oral membranes moist Neck: supple Cardiovascular: RRR without murmur. No JVD    Respiratory/Chest: CTA Bilaterally without wheezes or rales. Normal effort    GI/Abdomen: BS +, non-tender, non-distended Ext: no clubbing, cyanosis, or edema Psych: anxious Skin: No evidence of breakdown, no evidence of rash Neurologic: Cranial nerves II through XII intact, motor strength is 5/5 in bilateral deltoid, bicep, tricep, grip, hip flexor, knee extensors, ankle dorsiflexor and plantar flexor Sensation intact to LT both feet.  Pedal pulses are normal  Musculoskeletal: Full range of motion in all 4 extremities. No joint swelling    Assessment/Plan: 1. 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  Physiatrist is providing close team supervision and 24 hour management of active medical problems listed below.  Physiatrist and rehab team continue to assess barriers to discharge/monitor patient progress toward functional  and medical goals  Care Tool:  Bathing    Body parts bathed by patient: Right arm, Abdomen, Chest, Left arm, Front perineal area, Left upper leg, Face, Right upper leg, Right lower leg, Left lower leg   Body parts bathed by helper: Buttocks     Bathing assist Assist Level: Minimal Assistance - Patient > 75%     Upper Body Dressing/Undressing Upper body dressing   What is the patient wearing?: Pull over shirt, Bra (Min for bra supervision for shirt)    Upper body assist Assist Level: Minimal Assistance - Patient > 75%    Lower Body Dressing/Undressing Lower body dressing      What is the patient wearing?: Pants, Underwear/pull up     Lower body assist Assist for lower body dressing: Minimal Assistance - Patient > 75%     Toileting Toileting    Toileting assist Assist for toileting: Minimal Assistance - Patient > 75%     Transfers Chair/bed transfer  Transfers assist     Chair/bed transfer assist level: Minimal Assistance - Patient > 75%     Locomotion Ambulation   Ambulation assist      Assist level: Moderate Assistance - Patient 50 - 74% Assistive device: Hand held assist Max distance: 10   Walk 10 feet activity   Assist     Assist level: Moderate Assistance - Patient - 50 - 74% Assistive device: Hand held assist   Walk 50 feet activity   Assist Walk 50 feet with 2 turns activity did not occur: Safety/medical concerns         Walk 150 feet activity   Assist Walk 150 feet activity did not occur:  Safety/medical concerns         Walk 10 feet on uneven surface  activity   Assist     Assist level: Moderate Assistance - Patient - 50 - 74% Assistive device: Aeronautical engineer Will patient use wheelchair at discharge?: No             Wheelchair 50 feet with 2 turns activity    Assist            Wheelchair 150 feet activity     Assist          Blood pressure 124/85, pulse (!)  108, temperature 98.6 F (37 C), resp. rate 19, SpO2 100 %.  Medical Problem List and Plan: 1.Functional and mobility deficitssecondary to left cerebellar infarct -patient maymayshower -ELOS/Goals: tent D/C 6/22 ,inconsistent participation however 2. Antithrombotics: -DVT/anticoagulation:Pharmaceutical:Lovenox -antiplatelet therapy: On low dose ASA 3.C6/C7 radiculopathy/Pain Management:s/p ESI- 3/23.Pt with notable multifactorial spondylosis in mid to lower cervical spine -add heating pad.  -utilize muscle relaxant, Muscle balm - therapy addressing posture, ROM -Willalsocontinue Hydrocodone prn.  On high dose gabpentin at night for presumed neuropathic pain but no clinical signs of a severe neuropathy this was stopped.    -continue Topirimate for HA  4. Mood:LCSW to follow for evaluation and support. -antipsychotic agents: N/A  -anxiety is a barrier to participation   -add low dose xanax prn in addition to atarax   -request neuropsych eval this week 5. Neuropsych: This patientiscapable of making decisions on herown behalf. 6. Skin/Wound Care:Routine pressure relief measures. 7. Fluids/Electrolytes/Nutrition:Monitor I/O. Check lytes in am. Low BMI with moderate protein malnutrition  8. HTN: Monitor BP tid--on Lisinoprilwith BP still poorly controlled. SBP >100's. Will likely need titration of meds. Vitals:   07/25/19 2015 07/26/19 0409  BP: (!) 148/69 124/85  Pulse: 96 (!) 108  Resp: 19 19  Temp: 97.9 F (36.6 C) 98.6 F (37 C)  SpO2: 100% 100%  controlled on Lotensin 6/13 9. T2DM post pancreatectomy: Poorly controlled with Hgb A1C- 12.5. Monitor BS ac/hs--poor controlled. Resume 70/30 insulin. Was not usingmetforminPTA.Will d/c Glucerna between meals as likely affecting BS and change to Ensure Max which has 5g net carbs.  CBG (last 3)  Recent Labs     07/25/19 1632 07/25/19 2052 07/26/19 0612  GLUCAP 141* 344* 232*  Adjusted 70/30 dose increase to 22U BID 6/10, to 25U 6/11   -6/13 continued elevation thru w/e---increase to 28u today   -needs ongoing dietary education 10 Collagen Vascular disease/discoid Lupus: Resume Plaquenil and folic acid.  11. Pancreatic insufficiency/S/p Pancreatectomy: Resume pancrease tid ac. Will resume psyllium and loperemide prn for chronic diarrhea.  12. Dyslipidemia: On high dose statin.  13. Dizziness:Continue low dose antivert. May need vestibular evaluation. 50. H/o Major depressive disorder: Continue Wellbutrin--resume atarax bidprn(for anxiety). Has not been using Remeron.  -add prn xanax as above 15.Tobacco abuse: Willing to try nicotine gum--will order. 16. Vitamin B 12 deficiency: Gets IM injection every month.  17.  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  Insomnia multifactorial-added ropirinole for restless legs and oxybutnin for spastic bladder, rx anxiety as above  LOS: 6 days A FACE TO FACE EVALUATION WAS PERFORMED  [name] 07/26/2019, 10:53 AM

## 2019-07-26 NOTE — Progress Notes (Signed)
Hypoglycemic Event  CBG: 66  Treatment: 8 oz juice/soda  Symptoms: None  Follow-up CBG: 1726 CBG Result:156  Possible Reasons for Event: Unknown  Comments/MD notified:yes    Brittany [name]  [name]

## 2019-07-26 NOTE — Progress Notes (Signed)
Occupational Therapy Session Note  Patient Details  Name: Brittany [name] MRN: [id number] Date of Birth: 03/07/1960  Today's Date: 07/26/2019 OT Individual Time: 9622-2979 OT Individual Time Calculation (min): 43 min   Short Term Goals: Week 1:  OT Short Term Goal 1 (Week 1): Pt will complete toileting 3/3 tasks with Min A OT Short Term Goal 2 (Week 1): Pt will complete LB bathing at shower level with shower seat/bench with Min A OT Short Term Goal 3 (Week 1): Pt will consistently transfer to various surfaces with AD with Min A OT Short Term Goal 4 (Week 1): Pt will complete LB dressing with Min A with use of AE as needed  Skilled Therapeutic Interventions/Progress Updates:    Pt greeted while sitting on toilet, voiding B+B and very teary due to neck discomfort. She completed hygiene with significantly increased time and setup assistance while seated. Stand pivot<w/c completed with CGA once she elevated clothing. While seated at the sink, pt completed hand washing and face washing with setup assistance. Tried to have her complete oral care however she would not initiate ~10 minutes though she stated she was going to. OT completed gentle massage to shoulders and neck while guiding her through gentle ROM and stretches. Pt reported this helped with pain relief but began crying afterwards. Stand pivot<bed completed with CGA, pt once again needing significantly increased time to initiate and execute transfer. Pt remained in bed at close of session, all needs within reach and bed alarm set. OT provided an aromatherapy peace blend to her pillowcase to promote relaxation and calmness.    Therapy Documentation Precautions:  Precautions Precautions: Fall Restrictions Weight Bearing Restrictions: No Vital Signs: Therapy Vitals Temp: 98.2 F (36.8 C) Temp Source: Oral Pulse Rate: (!) 106 Resp: 19 BP: (!) 120/105 Patient Position (if appropriate): Lying Oxygen Therapy SpO2: 100 % O2 Device: Room  Air ADL: ADL Eating: Set up Where Assessed-Eating: Chair Grooming: Supervision/safety Where Assessed-Grooming: Sitting at sink Upper Body Bathing: Contact guard Lower Body Bathing: Moderate assistance Upper Body Dressing: Contact guard Lower Body Dressing: Moderate assistance Toileting: Moderate assistance Toilet Transfer: Moderate assistance (Min-Mod) Toilet Transfer Method: Stand pivot Tub/Shower Transfer: Moderate assistance (hand held assist)      Therapy/Group: Individual Therapy  Zuzu Befort A Kel Senn 07/26/2019, 3:48 PM

## 2019-07-27 ENCOUNTER — Inpatient Hospital Stay (HOSPITAL_COMMUNITY): Payer: Medicare Other | Admitting: Physical Therapy

## 2019-07-27 ENCOUNTER — Inpatient Hospital Stay (HOSPITAL_COMMUNITY): Payer: Medicare Other | Admitting: Speech Pathology

## 2019-07-27 ENCOUNTER — Inpatient Hospital Stay (HOSPITAL_COMMUNITY): Payer: Medicare Other | Admitting: Occupational Therapy

## 2019-07-27 LAB — GLUCOSE, CAPILLARY
Glucose-Capillary: 354 mg/dL — ABNORMAL HIGH (ref 70–99)
Glucose-Capillary: 133 mg/dL — ABNORMAL HIGH (ref 70–99)
Glucose-Capillary: 270 mg/dL — ABNORMAL HIGH (ref 70–99)
Glucose-Capillary: 88 mg/dL (ref 70–99)

## 2019-07-27 LAB — BASIC METABOLIC PANEL
Anion gap: 10 (ref 5–15)
BUN: 21 mg/dL — ABNORMAL HIGH (ref 6–20)
CO2: 22 mmol/L (ref 22–32)
Calcium: 9.7 mg/dL (ref 8.9–10.3)
Chloride: 100 mmol/L (ref 98–111)
Creatinine, Ser: 0.84 mg/dL (ref 0.44–1.00)
GFR calc Af Amer: >60 mL/min (ref >60)
GFR calc non Af Amer: >60 mL/min (ref >60)
Glucose, Bld: 369 mg/dL — ABNORMAL HIGH (ref 70–99)
Potassium: 3.8 mmol/L (ref 3.5–5.1)
Sodium: 132 mmol/L — ABNORMAL LOW (ref 135–145)

## 2019-07-27 LAB — CBC
HCT: 41.6 % (ref 36.0–46.0)
Hemoglobin: 13.8 g/dL (ref 12.0–15.0)
MCH: 31.4 pg (ref 26.0–34.0)
MCHC: 33.2 g/dL (ref 30.0–36.0)
MCV: 94.5 fL (ref 80.0–100.0)
Platelets: 603 10*3/uL — ABNORMAL HIGH (ref 150–400)
RBC: 4.4 MIL/uL (ref 3.87–5.11)
RDW: 12.4 % (ref 11.5–15.5)
WBC: 6.8 10*3/uL (ref 4.0–10.5)
nRBC: 0 % (ref 0.0–0.2)

## 2019-07-27 MED ORDER — METFORMIN HCL 500 MG PO TABS
500.0000 mg | ORAL_TABLET | Freq: Every day | ORAL | Status: DC
  Administered 2019-07-28 – 2019-08-02 (×5): 500 mg via ORAL
  Filled 2019-07-27 (×6): qty 1

## 2019-07-27 MED ORDER — MIRTAZAPINE 15 MG PO TABS
15.0000 mg | ORAL_TABLET | Freq: Every day | ORAL | Status: DC
  Administered 2019-07-27 – 2019-08-03 (×8): 15 mg via ORAL
  Filled 2019-07-27 (×8): qty 1

## 2019-07-27 NOTE — Progress Notes (Signed)
Occupational Therapy Session Note  Patient Details  Name: Brittany [name] MRN: [id number] Date of Birth: [birth year]  Today's Date: 07/27/2019 OT Individual Time: 1412-1530 OT Individual Time Calculation (min): 78 min    Short Term Goals: Week 1:  OT Short Term Goal 1 (Week 1): Pt will complete toileting 3/3 tasks with Min A OT Short Term Goal 2 (Week 1): Pt will complete LB bathing at shower level with shower seat/bench with Min A OT Short Term Goal 3 (Week 1): Pt will consistently transfer to various surfaces with AD with Min A OT Short Term Goal 4 (Week 1): Pt will complete LB dressing with Min A with use of AE as needed  Skilled Therapeutic Interventions/Progress Updates:    Pt greeted at time of session supine in bed, increased lethargy this date requiring increased physical assistance and encouragement to participate. Bed mobility supine to sit with Mod A as the pt could not bring herself to sit up EOB but did agree to ADL session. Once EOB, transferred to wheelchair without AD with Min A and brought to toilet where she was Min A with toileting requiring assistance for transfer and clothing management. Pt ambulated short distance to shower bench with hand held assist and extended time prior to completing UB/LB bathing with CGA but did require physical assist to maintain standing balance while washing buttocks. Once dried off in the same manner and transferred back to wheelchair with Min A, pt set up at sink where she experienced 2 boughts of emesis. Oral hygiene performed with set up d/t vomiting episode. Pt then performed UB/LB dressing with Min A as well for assist donning bra and pants over hips. Note that the pt has been CGA for most ADLs recently but noted to have regressed this session requiring Min A overall d/t not feeling well, reports of headache, increased lethargy. Nursing aware of emesis episodes and pt not feeling well this date. Pt reclined in bed with call bell in reach, alarm on,  all needs met.   Therapy Documentation Precautions:  Precautions Precautions: Fall Restrictions Weight Bearing Restrictions: No   Pain: Unclear rating, pt reports HA, nursing aware   Therapy/Group: Individual Therapy  Viona Gilmore 07/27/2019, 2:44 PM

## 2019-07-27 NOTE — Progress Notes (Signed)
Pt remains emotional tonight. Tearful about current hospitalization and not able to be home. Worrying about helping others once DC'd to home. Encouraged pt and educated about how therapy will help her get stronger.

## 2019-07-27 NOTE — Progress Notes (Signed)
Physical Therapy Session Note  Patient Details  Name: Brittany [name] MRN: [id number] Date of Birth: 03-18-1960  Today's Date: 07/27/2019 PT Individual Time: 1015-1120 PT Individual Time Calculation (min): 65 min   Short Term Goals: Week 1:  PT Short Term Goal 1 (Week 1): Pt will maintain sitting balance w/functional task such as while donning shoes and socks w/supervision only PT Short Term Goal 2 (Week 1): Pt will transfer bed to wc w/LRAD and cga PT Short Term Goal 3 (Week 1): Pt will ambulate 64f w/LRAD and cga  Skilled Therapeutic Interventions/Progress Updates: Pt presented in bed sleeping but able to arouse. Pt agreeable to therapy. Pt states some mild dizziness but no c/o pain. Pt required significantly increased time for all activities and required re-direction to complete all activities throughout session. Pt also noted to be significantly more labile this session as compared to previous session. Ultimately pt was able to perform supine to sit with supervision and use of bed features, STS from EOB with minA and gait to sink with CGA. Pt then stood at sink for approx 7 min while performing oral hygiene (completed with supervision). Pt then sat in w/c as PTA obtained mask. Pt then ambulated approx 478fto nsg station with CGA fading to minA with fatigue. Pt ambulated with step to pattern and noted mild R lateral lean. Pt with x 1 episode of knee buckling with PTA providing assistance for correction and to avoid LOB. Pt left in w/c at end of session with belt alarm on, call bell within reach and needs met.      Therapy Documentation Precautions:  Precautions Precautions: Fall Restrictions Weight Bearing Restrictions: No General:   Vital Signs: Therapy Vitals Temp: 98.6 F (37 C) Pulse Rate: 94 Resp: 16 BP: 114/90 Patient Position (if appropriate): Lying Oxygen Therapy SpO2: 99 % O2 Device: Room Air Pain: Pain Assessment Faces Pain Scale: No hurt   Therapy/Group:  Individual Therapy  Brittany [name]  Brittany [name], PTA  07/27/2019, 4:19 PM

## 2019-07-27 NOTE — Progress Notes (Signed)
speech therapist reported that patient vomited x2 during therapy session. This RN went into the to assess  patient but she was sleeping, when she wake up from sleep she was very tearful when asked patient said she was crying because she has family at home that she is not able to care for. This RN educated her to focus on her health first then her familty.

## 2019-07-27 NOTE — Progress Notes (Signed)
[facility] PHYSICAL MEDICINE & REHABILITATION PROGRESS NOTE   Subjective/Complaints:  Pt remains emotionally labile but stated that she was too tired for neuropsych during attemped visit on Friday .  Pt states that she didn't feel good then.  Discussed home meds remeron Also discussed appetite and diabetic management and importance of good intake Pt upset that staff thinks she doesn't know what is going on   ROS: Patient denies N/V/D , no CP or SOB    Objective:   No results found. Recent Labs    07/27/19 0649  WBC 6.8  HGB 13.8  HCT 41.6  PLT 603*   Recent Labs    07/27/19 0649  NA 132*  K 3.8  CL 100  CO2 22  GLUCOSE 369*  BUN 21*  CREATININE 0.84  CALCIUM 9.7    Intake/Output Summary (Last 24 hours) at 07/27/2019 0849 Last data filed at 07/26/2019 1730 Gross per 24 hour  Intake 240 ml  Output --  Net 240 ml     Physical Exam: Vital Signs Blood pressure 119/84, pulse (!) 109, temperature 98.9 F (37.2 C), temperature source Oral, resp. rate 18, SpO2 100 %.   General: No acute distress Mood and affect are appropriate Heart: Regular rate and rhythm no rubs murmurs or extra sounds Lungs: Clear to auscultation, breathing unlabored, no rales or wheezes Abdomen: Positive bowel sounds, soft nontender to palpation, nondistended Extremities: No clubbing, cyanosis, or edema Skin: No evidence of breakdown, no evidence of rash   Neurologic: Cranial nerves II through XII intact, motor strength is 5/5 in bilateral deltoid, bicep, tricep, grip, hip flexor, knee extensors, ankle dorsiflexor and plantar flexor Sensation intact to LT both feet.  Pedal pulses are normal  Musculoskeletal: Full range of motion in all 4 extremities. No joint swelling    Assessment/Plan: 1. 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  Physiatrist is providing close team  supervision and 24 hour management of active medical problems listed below.  Physiatrist and rehab team continue to assess barriers to discharge/monitor patient progress toward functional and medical goals  Care Tool:  Bathing    Body parts bathed by patient: Right arm, Abdomen, Chest, Left arm, Front perineal area, Left upper leg, Face, Right upper leg, Right lower leg, Left lower leg   Body parts bathed by helper: Buttocks     Bathing assist Assist Level: Minimal Assistance - Patient > 75%     Upper Body Dressing/Undressing Upper body dressing   What is the patient wearing?: Pull over shirt, Bra (Min for bra supervision for shirt)    Upper body assist Assist Level: Minimal Assistance - Patient > 75%    Lower Body Dressing/Undressing Lower body dressing      What is the patient wearing?: Pants, Underwear/pull up     Lower body assist Assist for lower body dressing: Minimal Assistance - Patient > 75%     Toileting Toileting    Toileting assist Assist for toileting: Minimal Assistance - Patient > 75%     Transfers Chair/bed transfer  Transfers assist     Chair/bed transfer assist level: Minimal Assistance - Patient > 75%     Locomotion Ambulation   Ambulation assist      Assist level: Moderate Assistance - Patient 50 - 74% Assistive device: Hand held assist Max distance: 10   Walk 10 feet activity   Assist     Assist  level: Moderate Assistance - Patient - 50 - 74% Assistive device: Hand held assist   Walk 50 feet activity   Assist Walk 50 feet with 2 turns activity did not occur: Safety/medical concerns         Walk 150 feet activity   Assist Walk 150 feet activity did not occur: Safety/medical concerns         Walk 10 feet on uneven surface  activity   Assist     Assist level: Moderate Assistance - Patient - 50 - 74% Assistive device: Aeronautical engineer Will patient use wheelchair at discharge?:  No             Wheelchair 50 feet with 2 turns activity    Assist            Wheelchair 150 feet activity     Assist          Blood pressure 119/84, pulse (!) 109, temperature 98.9 F (37.2 C), temperature source Oral, resp. rate 18, SpO2 100 %.  Medical Problem List and Plan: 1.Functional and mobility deficitssecondary to left cerebellar infarct CIR PT, OT, SLP  -ELOS/Goals: tent D/C 6/22 ,inconsistent participation however 2. Antithrombotics: -DVT/anticoagulation:Pharmaceutical:Lovenox -antiplatelet therapy: On low dose ASA 3.C6/C7 radiculopathy/Pain Management:s/p ESI- 3/23.Pt with notable multifactorial spondylosis in mid to lower cervical spine -add heating pad.  -utilize muscle relaxant, Muscle balm - therapy addressing posture, ROM -Willalsocontinue Hydrocodone prn.  On high dose gabpentin at night for presumed neuropathic pain but no clinical signs of a severe neuropathy this was stopped.    -continue Topirimate for HA  4. Mood:LCSW to follow for evaluation and support. -antipsychotic agents: N/A  -anxiety is a barrier to participation   -add low dose xanax prn in addition to atarax   -request neuropsych eval this week 5. Neuropsych: This patientiscapable of making decisions on herown behalf. 6. Skin/Wound Care:Routine pressure relief measures. 7. Fluids/Electrolytes/Nutrition:Monitor I/O. Check lytes in am. Low BMI with moderate protein malnutrition  8. HTN: Monitor BP tid--on Lisinoprilwith BP still poorly controlled. SBP >100's. Will likely need titration of meds. Vitals:   07/26/19 2039 07/27/19 0608  BP: (!) 127/96 119/84  Pulse: (!) 109 (!) 109  Resp: 15 18  Temp: 98.4 F (36.9 C) 98.9 F (37.2 C)  SpO2: 98% 100%  controlled on Lotensin 6/14 9. T2DM post pancreatectomy: Poorly controlled with Hgb A1C- 12.5. Monitor BS  ac/hs--poor controlled. Resume 70/30 insulin. Was not usingmetforminPTA.Will d/c Glucerna between meals as likely affecting BS and change to Ensure Max which has 5g net carbs.  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  11. Pancreatic insufficiency/S/p Pancreatectomy: Resume pancrease tid ac. Will resume psyllium and loperemide prn for chronic diarrhea.  12. Dyslipidemia: On high dose statin.  13. Dizziness:Continue low dose antivert. May need vestibular evaluation. 51. H/o Major depressive disorder: Continue Wellbutrin--resume atarax bidprn(for anxiety). Has not been using Remeron.  -add prn xanax as above 15.Tobacco abuse: Willing to try nicotine gum--will order. 16. Vitamin B 12 deficiency: Gets IM injection every month.  17.  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  Insomnia multifactorial-added ropirinole for restless legs and oxybutnin for spastic bladder, rx anxiety as above  LOS: 7 days  A FACE TO FACE EVALUATION WAS PERFORMED  Charlett Blake 07/27/2019, 8:49 AM

## 2019-07-27 NOTE — Progress Notes (Signed)
Speech Language Pathology Daily Session Note  Patient Details  Name: Brittany [name] MRN: [id number] Date of Birth: 08/05/1960  Today's Date: 07/27/2019 SLP Individual Time: 1305-1400 SLP Individual Time Calculation (min): 55 min  Short Term Goals: Week 1: SLP Short Term Goal 1 (Week 1): Pt will use compensatory memory strategies and/or aids to recall new complex information with Supervision A verbal/visual cues. SLP Short Term Goal 2 (Week 1): Pt will demonstrate ability to problem solve mildly complex functional tasks with Min A verbal/visual cues. SLP Short Term Goal 3 (Week 1): Pt will anticipate 2 tasks she will need assistance with at d/c based on current level of functioning with Min A cues.  Skilled Therapeutic Interventions: Skilled ST services focused on cognitive skills. Pt's participation was limited today by expressed fatigue/dizziness and internal distraction. Pt was agreeable to participate in medication management task of TID (3 times a day) pill organizer, however required continued redirection and emotional support. Pt was preservative on "that nurse gave me too many pills" indicating medication is causing fatigue/dizziness, SLP provided education about recently added medication and nurse educated pt that new medication was not given today for diabetes due to inconsistent eating habits, therefore no changes in medication since last treatment session. Pt was able to complete x3 medications with x1 per day dosage, in which simple problem solving appeared mod I. Pt requested to get into bed. SLP and nurse assisted. Pt was left with call bell within reach and bed alarm set.     Pain Pain Assessment Faces Pain Scale: No hurt  Therapy/Group: Individual Therapy  Jarin Cornfield  [facility] 07/27/2019, 4:14 PM

## 2019-07-28 ENCOUNTER — Inpatient Hospital Stay (HOSPITAL_COMMUNITY): Payer: Medicare Other | Admitting: Occupational Therapy

## 2019-07-28 ENCOUNTER — Inpatient Hospital Stay (HOSPITAL_COMMUNITY): Payer: Medicare Other | Admitting: Physical Therapy

## 2019-07-28 ENCOUNTER — Inpatient Hospital Stay (HOSPITAL_COMMUNITY): Payer: Medicare Other

## 2019-07-28 LAB — GLUCOSE, CAPILLARY
Glucose-Capillary: 140 mg/dL — ABNORMAL HIGH (ref 70–99)
Glucose-Capillary: 141 mg/dL — ABNORMAL HIGH (ref 70–99)
Glucose-Capillary: 147 mg/dL — ABNORMAL HIGH (ref 70–99)
Glucose-Capillary: 73 mg/dL (ref 70–99)

## 2019-07-28 NOTE — Progress Notes (Signed)
Physical Therapy Weekly Progress Note  Patient Details  Name: Brittany [name] MRN: [id number] Date of Birth: 08-27-1960  Beginning of progress report period: July 21, 2019 End of progress report period: July 28, 2019  Today's Date: 07/28/2019  Patient has met 3 of 3 short term goals.  Pt has made slow but steady progress towards goals this week. Pt is CGA to close S for bed mobility and demonstrates good sitting balance while performing functional tasks such as donning slippers. Pt is improving ambulation distance with overall CGA however continues to have limited endurance. Pt also has demonstrated some self limiting behaviors which causes pt to perform functional tasks in a more than reasonable amount of time.   Patient continues to demonstrate the following deficits muscle weakness and muscle joint tightness, decreased cardiorespiratoy endurance and impaired timing and sequencing, abnormal tone, unbalanced muscle activation, ataxia and decreased coordination and therefore will continue to benefit from skilled PT intervention to increase functional independence with mobility.  Patient progressing toward long term goals..  Continue plan of care.  PT Short Term Goals Week 1:  PT Short Term Goal 1 (Week 1): Pt will maintain sitting balance w/functional task such as while donning shoes and socks w/supervision only PT Short Term Goal 1 - Progress (Week 1): Met PT Short Term Goal 2 (Week 1): Pt will transfer bed to wc w/LRAD and cga PT Short Term Goal 2 - Progress (Week 1): Met PT Short Term Goal 3 (Week 1): Pt will ambulate 44f w/LRAD and cga PT Short Term Goal 3 - Progress (Week 1): Met Week 2:  PT Short Term Goal 1 (Week 2): Pt will ambulate with supervision assist consistently 742fand LRAD PT Short Term Goal 2 (Week 2): Pt will perform bed mobility with supervision A and no use of bed features. PT Short Term Goal 3 (Week 2): Pt will transfer to and from bed with supervision assist and  LRAD    Therapy Documentation Precautions:  Precautions Precautions: Fall Restrictions Weight Bearing Restrictions: No Vital Signs: Therapy Vitals Temp: 98.9 F (37.2 C) Pulse Rate: (!) 101 Resp: 18 BP: 95/74 Patient Position (if appropriate): Sitting Oxygen Therapy SpO2: 100 % O2 Device: Room Air   Therapy/Group: Individual Therapy  Brittany [name] 07/28/2019, 3:52 PM

## 2019-07-28 NOTE — Progress Notes (Signed)
Occupational Therapy Session Note  Patient Details  Name: Brittany [name] MRN: [id number] Date of Birth: 27-Mar-1960  Today's Date: 07/28/2019 OT Individual Time: 0349-1791  OT Individual Time Calculation (min): 26 min    Short Term Goals: Week 1:  OT Short Term Goal 1 (Week 1): Pt will complete toileting 3/3 tasks with Min A OT Short Term Goal 2 (Week 1): Pt will complete LB bathing at shower level with shower seat/bench with Min A OT Short Term Goal 3 (Week 1): Pt will consistently transfer to various surfaces with AD with Min A OT Short Term Goal 4 (Week 1): Pt will complete LB dressing with Min A with use of AE as needed  Skilled Therapeutic Interventions/Progress Updates:    Session 1: Upon entering the room, pt supine in bed with RN present and giving medications. Pt declined self care tasks and toileting this session. Supine >sit with supervision to EOB. Pt transferred from bed >wheelchair with CGA and use of RW with significantly less time that previous notes. Pt engaged in B UE coordination task to wrap hair with increased time this session. Pt requesting to finish breakfast and was able to open containers on her own with increased time. OT reviewed pt's schedule with her so she knew what to expect for therapy sessions. Pt remained in wheelchair with chair alarm activated and call bell within reach.   Therapy Documentation Precautions:  Precautions Precautions: Fall Restrictions Weight Bearing Restrictions: No General:   Vital Signs: Therapy Vitals Temp: 99.1 F (37.3 C) Temp Source: Oral Pulse Rate: 96 BP: 121/86 Patient Position (if appropriate): Lying Oxygen Therapy SpO2: 99 % O2 Device: Room Air Pain: Pain Assessment Pain Scale: 0-10 Pain Score: 4  Pain Type: Acute pain Pain Location: Head Pain Intervention(s): Medication (See eMAR) ADL: ADL Eating: Set up Where Assessed-Eating: Chair Grooming: Supervision/safety Where Assessed-Grooming: Sitting at  sink Upper Body Bathing: Contact guard Lower Body Bathing: Moderate assistance Upper Body Dressing: Contact guard Lower Body Dressing: Moderate assistance Toileting: Moderate assistance Toilet Transfer: Moderate assistance (Min-Mod) Toilet Transfer Method: Stand pivot Tub/Shower Transfer: Moderate assistance (hand held assist)   Therapy/Group: Individual Therapy  Gypsy Decant 07/28/2019, 8:31 AM

## 2019-07-28 NOTE — Progress Notes (Signed)
Notified that patient MEWS score changed to yellow from green by tech. Heart rate showing 111 on Dynamap. Patient assessed and radial pulse palpated by nurse with result of 96. Patient with no acute distress noted. Charge nurse made aware prior to assessment and re-assessment of pulse. Also made aware of changing back to green from yellow. Will continue to monitor patient and assess as needed.

## 2019-07-28 NOTE — Progress Notes (Signed)
Nutrition Follow-up  DOCUMENTATION CODES:   Non-severe (moderate) malnutrition in context of chronic illness  INTERVENTION:   - Continue MVI with minerals daily  - Continue Glucerna Shake po TID, each supplement provides 220 kcal and 10 grams of protein  - Encourage adequate PO intake  NUTRITION DIAGNOSIS:   Moderate Malnutrition related to chronic illness (stroke, poorly managed T2DM) as evidenced by mild fat depletion, moderate fat depletion, mild muscle depletion, moderate muscle depletion, percent weight loss (13.1% weight loss in less than 5 months).  Ongoing  GOAL:   Patient will meet greater than or equal to 90% of their needs  Progressing  MONITOR:   PO intake, Supplement acceptance, Labs, Weight trends  REASON FOR ASSESSMENT:   Malnutrition Screening Tool    ASSESSMENT:   59 year old female with PMH of T2DM, depression, discoid Lupus, tobacco use. Admitted on 07/12/19 with 3 day history of N/V and dizziness. Pt was found to have DKA. 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 Admitted to CIR on 6/07.  Noted target d/c date of 08/04/19. Noted pt had 2 episodes of vomiting yesterday during SLP therapy session.  Pt accepting 100% of Glucerna Shakes per [facility] documentation.  Spoke with pt at bedside. Pt requesting RD order her lunch as she is not sure whether she has ordered it yet. However, lunch tray delivered during time of RD visit and pt was okay with what was ordered. Pt tearful throughout visit.  Pt reports having a good appetite and eating well. Noted ~50% completed breakfast meal tray still in room.  Meal Completion: 10-100% x last 8 recorded meals (averaging 53%)  Medications reviewed and include: Glucerna Shake TID, folic acid, SSI, Novolog 70/30 28 units BID, Metformin, Remeron, MVI with minerals, miralax  Labs reviewed: sodium  132 CBG's: 88-270 x 24 hours  Diet Order:   Diet Order            Diet Carb Modified Fluid consistency: Thin; Room service appropriate? Yes  Diet effective now                 EDUCATION NEEDS:   Education needs have been addressed  Skin:  Skin Assessment: Reviewed RN Assessment  Last BM:  07/26/19 large type 5  Height:   Ht Readings from Last 1 Encounters:  07/13/19 5' (1.524 m)    Weight:   Wt Readings from Last 1 Encounters:  07/13/19 47.3 kg    Ideal Body Weight:  45.5 kg  BMI: 20.37 (calculated using height and weight from 07/13/19)  Estimated Nutritional Needs:   Kcal:  1500-1700  Protein:  75-90 grams  Fluid:  >/= 1.5 L    Gaynell Face, MS, RD, LDN Inpatient Clinical Dietitian Pager: [phone] Weekend/After Hours: [phone]

## 2019-07-28 NOTE — Progress Notes (Signed)
Physical Therapy Session Note  Patient Details  Name: Brittany [name] MRN: [id number] Date of Birth: 1960/11/23  Today's Date: 07/28/2019 PT Individual Time: 1100-1200 and 1400-1500 PT Individual Time Calculation (min): 60 min and 60 min  Short Term Goals: Week 1:  PT Short Term Goal 1 (Week 1): Pt will maintain sitting balance w/functional task such as while donning shoes and socks w/supervision only PT Short Term Goal 2 (Week 1): Pt will transfer bed to wc w/LRAD and cga PT Short Term Goal 3 (Week 1): Pt will ambulate 45f w/LRAD and cga  Skilled Therapeutic Interventions/Progress Updates: Pt presented in bed sleeping but able to arouse and agreeable to therapy. Pt denies pain during session. Pt required increased time and minA for bed mobility as NT arrived to take blood sugar and PTA encouraged pt to have sugar check in sitting. Once completed pt required re-orientation to day as pt adamant that it was Monday. Pt then becoming labile when re-oriented stating "I don't understand what's wrong with me". With increased time and encouragement pt performed STS with CGA and RW and ambulated to sink to perform oral hygiene in standing. Pt did note to perform activity at fast rate and was less labile with activity then previous day. Pt then rested in w/c as PTA obtained calendar and activities for pt to encourage participation. Pt transported to day room and participated in standing activity coloring without leaning on table. Pt was able to color for approx 10 min before requiring seated rest. Pt with no c/o lightheadedness but required seated rest due to increased neck pain. Pt then propelled back to room using BUE and agreeable to remain in w/c until after lunch. Pt left in w/c with belt alarm on, call bell within reach and dietitian present.   Tx2: Pt presented in bed awake and alert, agreeable to therapy. Pt states some neck pain and received pain meds during session. Pt performed supine to sit from  flat bed with use of bed rails and increased time. Performed ambulatory transfer to w/c with RW and CGA. Nutrition svcs arrived to take meal order. Pt provided dinner and breakfast for next day with PTA writing on sheet for pt to recall what was ordered. Pt then transported to rehab gym and participated in ambulation for endurance distances of 55, 70, and 334fwith overall CGA. Pt required x 1 cue for safety with RW when negotiating obstacles in crowded spaces. Pt also required verbal cues for increasing BOS and noted adduction with ambulation near scissoring gait. Pt was CGA for all STS during ambulation bouts with verbal cues for hand placement. PTA provided encouragement regarding pt's current progress as pt had episodes of lability regarding lack of progress and concern for home plan. Pt also participated in seated ball taps with 2lb dowel 2 x 15 and horseshoes without AD with emphasis on forward lean. Pt returned to w/c and transported back to room. Pt agreeable to remain in w/c at end of session and left with belt alarm on, call bell within reach and needs met.       Therapy Documentation Precautions:  Precautions Precautions: Fall Restrictions Weight Bearing Restrictions: No General:   Vital Signs: Therapy Vitals Temp: 98.9 F (37.2 C) Pulse Rate: (!) 101 Resp: 18 BP: 95/74 Patient Position (if appropriate): Sitting Oxygen Therapy SpO2: 100 % O2 Device: Room Air Pain:     Therapy/Group: Individual Therapy  Illeana Edick  Sameeha Rockefeller, PTA  07/28/2019, 3:49 PM

## 2019-07-28 NOTE — Plan of Care (Signed)
  Problem: RH Problem Solving Goal: LTG Patient will demonstrate problem solving for (SLP) Description: LTG:  Patient will demonstrate problem solving for basic/complex daily situations with cues  (SLP) Flowsheets (Taken 07/28/2019 1012) LTG Patient will demonstrate problem solving for: (downgraded due to limited participation) Supervision Note: Downgraded due to limited participation    Problem: RH Memory Goal: LTG Patient will demonstrate ability for day to day (SLP) Description: LTG:   Patient will demonstrate ability for day to day recall/carryover during cognitive/linguistic activities with assist  (SLP) Flowsheets (Taken 07/28/2019 1012) LTG: Patient will demonstrate ability for day to day recall/carryover during cognitive/linguistic activities with assist (SLP): (downgraded due to limited participation) Supervision Note: Downgraded due to limited participation Goal: LTG Patient will use memory compensatory aids to (SLP) Description: LTG:  Patient will use memory compensatory aids to recall biographical/new, daily complex information with cues (SLP) Flowsheets (Taken 07/28/2019 1012) LTG: Patient will use memory compensatory aids to (SLP): (downgraded due to limited participation) Supervision Note: Downgraded due to limited participation

## 2019-07-28 NOTE — Progress Notes (Signed)
Speech Language Pathology Daily Session Note  Patient Details  Name: PURVA VESSELL MRN: [id number] Date of Birth: Nov 02, 1960  Today's Date: 07/28/2019 SLP Individual Time: 5670-1410 SLP Individual Time Calculation (min): 55 min  Short Term Goals: Week 1: SLP Short Term Goal 1 (Week 1): Pt will use compensatory memory strategies and/or aids to recall new complex information with Supervision A verbal/visual cues. SLP Short Term Goal 2 (Week 1): Pt will demonstrate ability to problem solve mildly complex functional tasks with Min A verbal/visual cues. SLP Short Term Goal 3 (Week 1): Pt will anticipate 2 tasks she will need assistance with at d/c based on current level of functioning with Min A cues.  Skilled Therapeutic Interventions: Skilled ST services focused on cognitive skills. Pt was attempted to call the kitchen with room phone upon entering room. SLP assisted pt with dialing number, it took pt more than a reasonable amount of time to dial number therefore not completing the connection. SLP instructred pt in "chunky" strategy to aid in recall, pt was able to utilize strategy with mod A verbal cues. Pt placed meal order mod I. SLP facilitated recall of medications name/function while pt consumed breakfast tray. Pt required max A verbal cues for encouragement, more than a reasonable amount of time, closing eyes often (due to headache per pt report) and required repetition of questions. Pt was eventually ability to recall with aid morning verse lunch and night time medications fading to min A verbal cues. SLP downgraded cognitive goals due to slow and limited progress. Pt was left in room with call bell within reach and chair alarm set. ST recommends to continue skilled ST services.   As of note, PT expressed increase concerns with pt's daily recall. SLP will began memory notebook in next treatment session.      Pain Pain Assessment Pain Score: 0-No pain  Therapy/Group: Individual  Therapy  Ezrah Dembeck  [facility] 07/28/2019, 5:02 PM

## 2019-07-28 NOTE — Progress Notes (Signed)
[facility] PHYSICAL MEDICINE & REHABILITATION PROGRESS NOTE   Subjective/Complaints:  No issues overnite   ROS: Patient denies N/V/D , no CP or SOB    Objective:   No results found. Recent Labs    07/27/19 0649  WBC 6.8  HGB 13.8  HCT 41.6  PLT 603*   Recent Labs    07/27/19 0649  NA 132*  K 3.8  CL 100  CO2 22  GLUCOSE 369*  BUN 21*  CREATININE 0.84  CALCIUM 9.7    Intake/Output Summary (Last 24 hours) at 07/28/2019 0902 Last data filed at 07/27/2019 1200 Gross per 24 hour  Intake 100 ml  Output --  Net 100 ml     Physical Exam: Vital Signs Blood pressure 121/86, pulse 96, temperature 99.1 F (37.3 C), temperature source Oral, resp. rate 16, SpO2 99 %.  General: No acute distress Mood and affect are appropriate Heart: Regular rate and rhythm no rubs murmurs or extra sounds Lungs: Clear to auscultation, breathing unlabored, no rales or wheezes Abdomen: Positive bowel sounds, soft nontender to palpation, nondistended Extremities: No clubbing, cyanosis, or edema Skin: No evidence of breakdown, no evidence of rash  Neurologic: Cranial nerves II through XII intact, motor strength is 5/5 in bilateral deltoid, bicep, tricep, grip, hip flexor, knee extensors, ankle dorsiflexor and plantar flexor Sensation intact to LT both feet.  Pedal pulses are normal  Musculoskeletal: Full range of motion in all 4 extremities. No joint swelling    Assessment/Plan: 1. 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  Physiatrist is providing close team supervision and 24 hour management of active medical problems listed below.  Physiatrist and rehab team continue to assess barriers to discharge/monitor patient progress toward functional and medical goals  Care Tool:  Bathing    Body parts bathed by patient: Right arm, Abdomen, Chest, Left arm, Front  perineal area, Left upper leg, Face, Right upper leg, Right lower leg, Left lower leg, Buttocks   Body parts bathed by helper: Buttocks     Bathing assist Assist Level: Minimal Assistance - Patient > 75%     Upper Body Dressing/Undressing Upper body dressing   What is the patient wearing?: Pull over shirt, Bra    Upper body assist Assist Level: Minimal Assistance - Patient > 75% (assistance for bra, set up for shirt)    Lower Body Dressing/Undressing Lower body dressing      What is the patient wearing?: Pants, Underwear/pull up     Lower body assist Assist for lower body dressing: Minimal Assistance - Patient > 75%     Toileting Toileting    Toileting assist Assist for toileting: Minimal Assistance - Patient > 75%     Transfers Chair/bed transfer  Transfers assist     Chair/bed transfer assist level: Contact Guard/Touching assist     Locomotion Ambulation   Ambulation assist      Assist level: Moderate Assistance - Patient 50 - 74% Assistive device: Hand held assist Max distance: 10   Walk 10 feet activity   Assist     Assist level: Moderate Assistance - Patient - 50 - 74% Assistive device: Hand held assist   Walk 50 feet activity   Assist Walk 50 feet with 2 turns activity did not occur: Safety/medical concerns         Walk 150 feet activity   Assist Walk 150 feet activity did not occur:  Safety/medical concerns         Walk 10 feet on uneven surface  activity   Assist     Assist level: Moderate Assistance - Patient - 50 - 74% Assistive device: Aeronautical engineer Will patient use wheelchair at discharge?: No             Wheelchair 50 feet with 2 turns activity    Assist            Wheelchair 150 feet activity     Assist          Blood pressure 121/86, pulse 96, temperature 99.1 F (37.3 C), temperature source Oral, resp. rate 16, SpO2 99 %.  Medical Problem List and  Plan: 1.Functional and mobility deficitssecondary to left cerebellar infarct CIR PT, OT, SLP  -ELOS/Goals: tent D/C 6/22, team conf in am  2. Antithrombotics: -DVT/anticoagulation:Pharmaceutical:Lovenox -antiplatelet therapy: On low dose ASA 3.C6/C7 radiculopathy/Pain Management:s/p ESI- 3/23.Pt with notable multifactorial spondylosis in mid to lower cervical spine -add heating pad.  -utilize muscle relaxant, Muscle balm - therapy addressing posture, ROM -Willalsocontinue Hydrocodone prn.  On high dose gabpentin at night for presumed neuropathic pain but no clinical signs of a severe neuropathy this was stopped.    -continue Topirimate for HA  4. Mood:LCSW to follow for evaluation and support. -antipsychotic agents: N/A  -anxiety is a barrier to participation   -add low dose xanax prn in addition to atarax   -request neuropsych eval this week 5. Neuropsych: This patientiscapable of making decisions on herown behalf. 6. Skin/Wound Care:Routine pressure relief measures. 7. Fluids/Electrolytes/Nutrition:Monitor I/O. Check lytes in am. Low BMI with moderate protein malnutrition  8. HTN: Monitor BP tid--on Lisinoprilwith BP still poorly controlled. SBP >100's. Will likely need titration of meds. Vitals:   07/28/19 0511 07/28/19 0520  BP: 121/86   Pulse: (!) 111 96  Resp:    Temp: 99.1 F (37.3 C)   SpO2: 99%   controlled on Lotensin 6/15 9. T2DM post pancreatectomy: Poorly controlled with Hgb A1C- 12.5. Monitor BS ac/hs--poor controlled. Resume 70/30 insulin. Was not usingmetforminPTA.Will d/c Glucerna between meals as likely affecting BS and change to Ensure Max which has 5g net carbs.  CBG (last 3)  Recent Labs    07/27/19 1644 07/27/19 2123 07/28/19 0605  GLUCAP 270* 88 140*  Adjusted 70/30 dose  -6/13 28U BID  10 Collagen Vascular disease/discoid Lupus:  Resume Plaquenil and folic acid.  11. Pancreatic insufficiency/S/p Pancreatectomy: Resume pancrease tid ac. Will resume psyllium and loperemide prn for chronic diarrhea.  12. Dyslipidemia: On high dose statin.  13. Dizziness:Continue low dose antivert. May need vestibular evaluation. 25. H/o Major depressive disorder: Continue Wellbutrin--resume atarax bidprn(for anxiety). Has not been using Remeron.  -add prn xanax as above 15.Tobacco abuse: Willing to try nicotine gum--will order. 16. Vitamin B 12 deficiency: Gets IM injection every month.  17.  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  Insomnia multifactorial-added ropirinole for restless legs and oxybutnin for spastic bladder, rx anxiety as above  LOS: 8 days A FACE TO FACE EVALUATION WAS PERFORMED  Charlett Blake 07/28/2019, 9:02 AM

## 2019-07-28 NOTE — Progress Notes (Signed)
Pt recorded a yellow MEWS score. Charge nurse was informed. Pt has a pulse rate showing 101, and pt is showing no signs of distress. Will continue to monitor pt and assess as needed. Manual pulse taken, 98 noted.

## 2019-07-29 ENCOUNTER — Inpatient Hospital Stay (HOSPITAL_COMMUNITY): Payer: Medicare Other | Admitting: Physical Therapy

## 2019-07-29 ENCOUNTER — Inpatient Hospital Stay (HOSPITAL_COMMUNITY): Payer: Medicare Other | Admitting: Occupational Therapy

## 2019-07-29 LAB — GLUCOSE, CAPILLARY
Glucose-Capillary: 109 mg/dL — ABNORMAL HIGH (ref 70–99)
Glucose-Capillary: 98 mg/dL (ref 70–99)
Glucose-Capillary: 151 mg/dL — ABNORMAL HIGH (ref 70–99)
Glucose-Capillary: 197 mg/dL — ABNORMAL HIGH (ref 70–99)

## 2019-07-29 MED ORDER — CARVEDILOL 3.125 MG PO TABS
3.1250 mg | ORAL_TABLET | Freq: Two times a day (BID) | ORAL | Status: DC
  Administered 2019-07-30 – 2019-08-04 (×11): 3.125 mg via ORAL
  Filled 2019-07-29 (×11): qty 1

## 2019-07-29 MED ORDER — BENAZEPRIL HCL 5 MG PO TABS
5.0000 mg | ORAL_TABLET | Freq: Every day | ORAL | Status: DC
  Administered 2019-07-30 – 2019-08-04 (×6): 5 mg via ORAL
  Filled 2019-07-29 (×6): qty 1

## 2019-07-29 NOTE — Progress Notes (Signed)
Patient ID: Brittany [name], female   DOB: [birth year], 59 y.o.   MRN: [id number]   SW called daughter to set up family education. Phone not in service, will follow up with patient
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
## 2019-07-29 NOTE — Progress Notes (Signed)
[facility] PHYSICAL MEDICINE & REHABILITATION PROGRESS NOTE   Subjective/Complaints:  incont episode, slept hard  ROS: Patient denies N/V/D , no CP or SOB    Objective:   No results found. Recent Labs    07/27/19 0649  WBC 6.8  HGB 13.8  HCT 41.6  PLT 603*   Recent Labs    07/27/19 0649  NA 132*  K 3.8  CL 100  CO2 22  GLUCOSE 369*  BUN 21*  CREATININE 0.84  CALCIUM 9.7    Intake/Output Summary (Last 24 hours) at 07/29/2019 1012 Last data filed at 07/28/2019 1900 Gross per 24 hour  Intake 120 ml  Output --  Net 120 ml     Physical Exam: Vital Signs Blood pressure 101/76, pulse (!) 103, temperature 98 F (36.7 C), resp. rate 18, SpO2 100 %. General: No acute distress Mood and affect are appropriate Heart: Regular rate and rhythm no rubs murmurs or extra sounds Lungs: Clear to auscultation, breathing unlabored, no rales or wheezes Abdomen: Positive bowel sounds, soft nontender to palpation, nondistended Extremities: No clubbing, cyanosis, or edema Skin: No evidence of breakdown, no evidence of rash  Neurologic: Cranial nerves II through XII intact, motor strength is 5/5 in bilateral deltoid, bicep, tricep, grip, hip flexor, knee extensors, ankle dorsiflexor and plantar flexor Sensation intact to LT both feet.  Pedal pulses are normal  Musculoskeletal: Full range of motion in all 4 extremities. No joint swelling    Assessment/Plan: 1. 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  Physiatrist is providing close team supervision and 24 hour management of active medical problems listed below.  Physiatrist and rehab team continue to assess barriers to discharge/monitor patient progress toward functional and medical goals  Care Tool:  Bathing    Body parts bathed by patient: Right arm, Abdomen, Chest, Left arm, Front perineal area, Left  upper leg, Face, Right upper leg, Right lower leg, Left lower leg, Buttocks   Body parts bathed by helper: Buttocks     Bathing assist Assist Level: Minimal Assistance - Patient > 75%     Upper Body Dressing/Undressing Upper body dressing   What is the patient wearing?: Pull over shirt, Bra    Upper body assist Assist Level: Minimal Assistance - Patient > 75% (assistance for bra, set up for shirt)    Lower Body Dressing/Undressing Lower body dressing      What is the patient wearing?: Pants, Underwear/pull up     Lower body assist Assist for lower body dressing: Minimal Assistance - Patient > 75%     Toileting Toileting    Toileting assist Assist for toileting: Minimal Assistance - Patient > 75%     Transfers Chair/bed transfer  Transfers assist     Chair/bed transfer assist level: Contact Guard/Touching assist     Locomotion Ambulation   Ambulation assist      Assist level: Moderate Assistance - Patient 50 - 74% Assistive device: Hand held assist Max distance: 10   Walk 10 feet activity   Assist     Assist level: Moderate Assistance - Patient - 50 - 74% Assistive device: Hand held assist   Walk 50 feet activity   Assist Walk 50 feet with 2 turns activity did not occur: Safety/medical concerns         Walk 150 feet activity   Assist Walk 150 feet activity did not occur: Safety/medical concerns  Walk 10 feet on uneven surface  activity   Assist     Assist level: Moderate Assistance - Patient - 50 - 74% Assistive device: Aeronautical engineer Will patient use wheelchair at discharge?: No             Wheelchair 50 feet with 2 turns activity    Assist            Wheelchair 150 feet activity     Assist          Blood pressure 101/76, pulse (!) 103, temperature 98 F (36.7 C), resp. rate 18, SpO2 100 %.  Medical Problem List and Plan: 1.Functional and mobility  deficitssecondary to left cerebellar infarct CIR PT, OT, SLP  -ELOS/Goals: tent D/C 6/22, Team conference today please see physician documentation under team conference tab, met with team  to discuss problems,progress, and goals. Formulized individual treatment plan based on medical history, underlying problem and comorbidities.  2. Antithrombotics: -DVT/anticoagulation:Pharmaceutical:Lovenox -antiplatelet therapy: On low dose ASA 3.C6/C7 radiculopathy/Pain Management:s/p ESI- 3/23.Pt with notable multifactorial spondylosis in mid to lower cervical spine -add heating pad.  -utilize muscle relaxant, Muscle balm - therapy addressing posture, ROM -Willalsocontinue Hydrocodone prn.  On high dose gabpentin at night for presumed neuropathic pain but no clinical signs of a severe neuropathy this was stopped.    -continue Topirimate for HA  4. Mood:LCSW to follow for evaluation and support. -antipsychotic agents: N/A  -anxiety is a barrier to participation   -add low dose xanax prn in addition to atarax   -request neuropsych eval this week 5. Neuropsych: This patientiscapable of making decisions on herown behalf. 6. Skin/Wound Care:Routine pressure relief measures. 7. Fluids/Electrolytes/Nutrition:Monitor I/O. Check lytes in am. Low BMI with moderate protein malnutrition  8. HTN: Monitor BP tid--on Lisinoprilwith BP still poorly controlled. SBP >100's. Will likely need titration of meds. Vitals:   07/28/19 2012 07/29/19 0622  BP: 99/76 101/76  Pulse: 98 (!) 103  Resp: 18 18  Temp: 98.2 F (36.8 C) 98 F (36.7 C)  SpO2: 99% 100%  controlled on Lotensin 6/15, will reduce and start low dose coreg for tachy 9. T2DM post pancreatectomy: Poorly controlled with Hgb A1C- 12.5. Monitor BS ac/hs--poor controlled. Resume 70/30 insulin. Was not usingmetforminPTA.Will d/c Glucerna between  meals as likely affecting BS and change to Ensure Max which has 5g net carbs.  CBG (last 3)  Recent Labs    07/28/19 1658 07/28/19 2107 07/29/19 0624  GLUCAP 147* 73 109*  Adjusted 70/30 dose  -6/13 28U BID - controlled 10 Collagen Vascular disease/discoid Lupus: Resume Plaquenil and folic acid.  11. Pancreatic insufficiency/S/p Pancreatectomy: Resume pancrease tid ac. Will resume psyllium and loperemide prn for chronic diarrhea.  12. Dyslipidemia: On high dose statin.  13. Dizziness:Continue low dose antivert. May need vestibular evaluation. 8. H/o Major depressive disorder: Continue Wellbutrin--resume atarax bidprn(for anxiety). Has not been using Remeron.  -add prn xanax as above 15.Tobacco abuse: Willing to try nicotine gum--will order. 16. Vitamin B 12 deficiency: Gets IM injection every month.  17.  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  Insomnia multifactorial-added ropirinole for restless legs and oxybutnin for spastic bladder, rx anxiety as above  LOS: 9 days A FACE TO FACE EVALUATION WAS PERFORMED  Brittany [name] 07/29/2019, 10:12 AM

## 2019-07-29 NOTE — Progress Notes (Signed)
Occupational Therapy Weekly Progress Note  Patient Details  Name: Brittany [name] MRN: [id number] Date of Birth: [birth year]  Beginning of progress report period: July 21, 2019 End of progress report period: July 29, 2019  Today's Date: 07/29/2019 OT Individual Time: 9150-5697 OT Individual Time Calculation (min): 43 min    Patient has met 4 of 4 short term goals. Pt making progress this week towards occupational therapy goals. Pt currently requires CGA for self care tasks and functional transfer. Pt has been less emotional over the last few session and has been motivated for OT intervention. Pt has also been able to complete tasks with less time during sessions with less cuing needed for safety awareness. Pt continues to benefit from OT intervention to address functional deficits.    Patient continues to demonstrate the following deficits: muscle weakness, decreased cardiorespiratoy endurance, decreased awareness, decreased problem solving, decreased safety awareness and decreased memory and decreased standing balance, decreased postural control and decreased balance strategies and therefore will continue to benefit from skilled OT intervention to enhance overall performance with BADL.  Patient progressing toward long term goals..  Continue plan of care.  OT Short Term Goals Week 1:  OT Short Term Goal 1 (Week 1): Pt will complete toileting 3/3 tasks with Min A OT Short Term Goal 1 - Progress (Week 1): Met OT Short Term Goal 2 (Week 1): Pt will complete LB bathing at shower level with shower seat/bench with Min A OT Short Term Goal 2 - Progress (Week 1): Met OT Short Term Goal 3 (Week 1): Pt will consistently transfer to various surfaces with AD with Min A OT Short Term Goal 3 - Progress (Week 1): Met OT Short Term Goal 4 (Week 1): Pt will complete LB dressing with Min A with use of AE as needed OT Short Term Goal 4 - Progress (Week 1): Met Week 2:  OT Short Term Goal 1 (Week 2): STG=LTGs  secondary to short LOS  Skilled Therapeutic Interventions/Progress Updates:    Upon entering the room, pt seated in recliner chair with RN present giving medication. Pt with c/o headache. Pt requesting to shower this session and given time limit for task to occur. Pt doffing clothing items from recliner chair and ambulating with RW and CGA into bathroom and onto TTB. Pt bathing with close supervision and standing to wash buttocks and peri area with CGA. Pt drying self and returning to sit in recliner chair to don clothing items with CGA for balance with LB dressing and pt using figure four position. Pt required assist to pull bra down in the back. Pt remained seated in recliner chair with call bell and all needed items within reach upon exiting the room.   Therapy Documentation Precautions:  Precautions Precautions: Fall Restrictions Weight Bearing Restrictions: No ADL: ADL Eating: Set up Where Assessed-Eating: Chair Grooming: Supervision/safety Where Assessed-Grooming: Sitting at sink Upper Body Bathing: Contact guard Lower Body Bathing: Moderate assistance Upper Body Dressing: Contact guard Lower Body Dressing: Moderate assistance Toileting: Moderate assistance Toilet Transfer: Moderate assistance (Min-Mod) Toilet Transfer Method: Stand pivot Tub/Shower Transfer: Moderate assistance (hand held assist) Vision   Perception    Praxis   Exercises:   Other Treatments:     Therapy/Group: Individual Therapy  Gypsy Decant 07/29/2019, 12:03 PM

## 2019-07-29 NOTE — Progress Notes (Signed)
Patient ID: Brittany [name], female   DOB: Mar 14, 1960, 59 y.o.   MRN: [id number]  Team Conference Report to Patient/Family  Team Conference discussion was reviewed with the patient and caregiver, including goals, any changes in plan of care and target discharge date.  Patient and caregiver express understanding and are in agreement.  The patient has a target discharge date of 08/04/19.  Dyanne Iha 07/29/2019, 2:11 PM

## 2019-07-29 NOTE — Progress Notes (Signed)
Physical Therapy Session Note  Patient Details  Name: Brittany [name] MRN: [id number] Date of Birth: 06/12/1960  Today's Date: 07/29/2019 PT Individual Time: 1443-1540 PT Individual Time Calculation (min): 32 min   Short Term Goals: Week 2:  PT Short Term Goal 1 (Week 2): Pt will ambulate with supervision assist consistently 54f and LRAD PT Short Term Goal 2 (Week 2): Pt will perform bed mobility with supervision A and no use of bed features. PT Short Term Goal 3 (Week 2): Pt will transfer to and from bed with supervision assist and LRAD  Skilled Therapeutic Interventions/Progress Updates: Pt presented in bed with nsg present and pt providing lunch menu. Breakfast also present and uneaten therefore PTA encouraged pt to sit at EOB once completing lunch menu to take meds and transfer to w/c to eat breakfast to which pt was agreeable. Pt performed supine to sit with supervision. Pt donned pants with set up and performed STS with RW and supervision with verbal cues for hand placement to pull pants over hips. Pt donned bra and shirt with set up then performed ambulatory transfer to w/c with RW and close S. Pt then set up to eat breakfast. Pt discussed importance to attempting to increase upright tolerance as pt spends most of time in bed between session. Suggested use of recliner to which pt was agreeable. Discussed possible transfer to recliner once breakfast completed.  PTA returned to room approx 20 min later however pt had not completed breakfast. Adv pt to complete breaks and left with call bell within reach and needs met.      Therapy Documentation Precautions:  Precautions Precautions: Fall Restrictions Weight Bearing Restrictions: No General: PT Amount of Missed Time (min): 28 Minutes Vital Signs: Therapy Vitals Temp: 98 F (36.7 C) Pulse Rate: (!) 103 Resp: 18 BP: 101/76 Patient Position (if appropriate): Lying Oxygen Therapy SpO2: 100 % O2 Device: Room Air Pain:    Mobility:   Locomotion :    Trunk/Postural Assessment :    Balance:   Exercises:   Other Treatments:      Therapy/Group: Individual Therapy  Shiana Rappleye 07/29/2019, 9:01 AM

## 2019-07-29 NOTE — Progress Notes (Signed)
Physical Therapy Session Note  Patient Details  Name: Brittany [name] MRN: [id number] Date of Birth: Nov 29, 1960  Today's Date: 07/29/2019 PT Individual Time: 5726-2035 AND 1330-1400 PT Individual Time Calculation (min): 45 min and 30 min   Short Term Goals: Week 2:  PT Short Term Goal 1 (Week 2): Pt will ambulate with supervision assist consistently 57f and LRAD PT Short Term Goal 2 (Week 2): Pt will perform bed mobility with supervision A and no use of bed features. PT Short Term Goal 3 (Week 2): Pt will transfer to and from bed with supervision assist and LRAD  Skilled Therapeutic Interventions/Progress Updates:   Session 1  Pt received sitting in WC and agreeable to PT. Pt performed hair care at sink to place head wrap with supervision assist. Pt mildly emotional when therapist entered room, but able to redirect to task with only min cues today. [facility] With max encouragement and redirection, pt agreeable to perform gait training with RW x 158fand supervision-CGA from PT for safety. Continual encouragement and rediection  To task throughout gait training, but only mild lOB noted with turn once throughout gait training. Patient returned to room and performed stand pivot to recliner with supervision assist and RW, cues for gait pattern in turn to keep wide BOS. Pt left sitting in recliner with call bell in reach and all needs met.  Pt was very internally distracted by cell phone and continually trying to make phone call, regardless of education of no service to phone.    Session 2.  Pt received supine in bed and agreeable to PT at bed level due to severe dizziness. PT assessed Vital signs. 85/60, HR 64. Pt instructed pt in supine NMR. For BLE each completed x 12 BLE with level 2 tband and cues for decreased . SLR, hip abduction, heel slides, bridge, clam shell, hip flexion rolling R and L. UE NMR performed with level 2 tband chest press, and bicep cues with cues  for decreased speed. Pt reporting increasing fatigue ad dizziness and allowed to rest. Pt left supine in bed with call bell in reach and all needs met.      Therapy Documentation Precautions:  Precautions Precautions: Fall Restrictions Weight Bearing Restrictions: No General: PT Amount of Missed Time (min): 15 Minutes PT Missed Treatment Reason: Patient fatigue   Pain:   denies    Therapy/Group: Individual Therapy  AuLorie Phenix/16/2021, 11:47 AM

## 2019-07-30 ENCOUNTER — Inpatient Hospital Stay (HOSPITAL_COMMUNITY): Payer: Medicare Other | Admitting: Occupational Therapy

## 2019-07-30 ENCOUNTER — Inpatient Hospital Stay (HOSPITAL_COMMUNITY): Payer: Medicare Other | Admitting: Physical Therapy

## 2019-07-30 ENCOUNTER — Inpatient Hospital Stay (HOSPITAL_COMMUNITY): Payer: Medicare Other | Admitting: Speech Pathology

## 2019-07-30 ENCOUNTER — Inpatient Hospital Stay (HOSPITAL_COMMUNITY): Payer: Medicare Other | Admitting: *Deleted

## 2019-07-30 LAB — GLUCOSE, CAPILLARY
Glucose-Capillary: 119 mg/dL — ABNORMAL HIGH (ref 70–99)
Glucose-Capillary: 133 mg/dL — ABNORMAL HIGH (ref 70–99)
Glucose-Capillary: 162 mg/dL — ABNORMAL HIGH (ref 70–99)
Glucose-Capillary: 182 mg/dL — ABNORMAL HIGH (ref 70–99)
Glucose-Capillary: 278 mg/dL — ABNORMAL HIGH (ref 70–99)
Glucose-Capillary: 352 mg/dL — ABNORMAL HIGH (ref 70–99)
Glucose-Capillary: 51 mg/dL — ABNORMAL LOW (ref 70–99)
Glucose-Capillary: 97 mg/dL (ref 70–99)
Glucose-Capillary: <10 mg/dL — CL (ref 70–99)

## 2019-07-30 LAB — GLUCOSE, RANDOM: Glucose, Bld: 132 mg/dL — ABNORMAL HIGH (ref 70–99)

## 2019-07-30 MED ORDER — GLUCAGON HCL RDNA (DIAGNOSTIC) 1 MG IJ SOLR: 1.0000 mg | INTRAMUSCULAR | Status: AC

## 2019-07-30 MED ORDER — GLUCAGON HCL RDNA (DIAGNOSTIC) 1 MG IJ SOLR
INTRAMUSCULAR | Status: AC
  Filled 2019-07-30: qty 1

## 2019-07-30 MED ORDER — DEXTROSE 50 % IV SOLN
INTRAVENOUS | Status: AC
  Administered 2019-07-30: 50 mL
  Filled 2019-07-30: qty 50

## 2019-07-30 MED ORDER — GLUCAGON HCL RDNA (DIAGNOSTIC) 1 MG IJ SOLR: 1.0000 mg | INTRAMUSCULAR | Status: DC

## 2019-07-30 NOTE — Progress Notes (Signed)
Patient claimed she lost 2 gold rings this morning. The nurse tech, Destiny, went in the room to help her look for the ring. Per NT patient got on her hands and knees and crawled on the floor looking for the rings under the bed despite the tech repeatedly advising her not to and offering to look in the floor for her. Patient then got up and returned to bed upset about her rings being lost.

## 2019-07-30 NOTE — Progress Notes (Signed)
Physical Therapy Session Note  Patient Details  Name: Brittany [name] MRN: [id number] Date of Birth: 1960-02-24  Today's Date: 07/30/2019 PT Individual Time: 1700-1710 PT Individual Time Calculation (min): 10 min   Short Term Goals: Week 2:  PT Short Term Goal 1 (Week 2): Pt will ambulate with supervision assist consistently 87f and LRAD PT Short Term Goal 2 (Week 2): Pt will perform bed mobility with supervision A and no use of bed features. PT Short Term Goal 3 (Week 2): Pt will transfer to and from bed with supervision assist and LRAD  Skilled Therapeutic Interventions/Progress Updates:   Pt received sitting in WC and agreeable to PT, but RN present following glucose assessed and reports pt's glucose 51. Pt repeatedly stating that she wanted to participate in therapy and performed sit<>stand from recliner to [facility]. PT educated pt on importance of increasing blood glucose to prevent additional medical complications and pt eventually agreeable to eat. While PT present, pt consumed toast, pears, and coke, but required cues to redirection to food intermittently. Pt left sitting in recliner with all needs met to allow blood glucose to return to normal levels.       Therapy Documentation Precautions:  Precautions Precautions: Fall Restrictions Weight Bearing Restrictions: No General: PT Amount of Missed Time (min): 35 Minutes PT Missed Treatment Reason: Other (Comment) (low glucose) Pain: Pain Assessment Pain Scale: Faces Faces Pain Scale: No hurt    Therapy/Group: Individual Therapy  ALorie Phenix6/17/2021, 5:55 PM

## 2019-07-30 NOTE — Progress Notes (Signed)
Was called to the patient's room due to change in LOC. Patient was awake but was not responding to verbal stimuli, was flailing in bed, not focusing when spoken to & had muscle stiffening. Rapid response was called. Vitals were taken & was found to be severely hypoglycemic. While obtaining coverage, rapid response arrived & supplies were gathered to gain IV access. She was given glucagen by her nurse & while IV were being tried by multiple nurses, her blood sugar was taken again. The result stated <10.Patient was rigged & moving aggressively, kicking, had to be held by staff for IV attempts. IV access was obtained after multiple tries & she was given D50. She began to arouse but still took some minutes before she would answer. Blood sugars were retaken & patient's blood sugar was 278. On call provider was called & informed. Orders were given for a serum glucose for accuracy & repeat glucose monitoring. Will continue to monitor. Left in care of her nurse.

## 2019-07-30 NOTE — Progress Notes (Signed)
Hypoglycemic Event  CBG: 51  Treatment: 8 oz juice/soda  Symptoms: None  Follow-up CBG: Time:1724 CBG Result: 352  Possible Reasons for Event: Inadequate meal intake  Comments/MD notified: Pam PA notified    Goro Wenrick A

## 2019-07-30 NOTE — Progress Notes (Signed)
Patient ID: Brittany [name], female   DOB: 1961-01-19, 59 y.o.   MRN: [id number]  Patient provided updated number for daughter :[phone]  SW attempted to call to schedule family education with daughter. No answer, voicemail full. Sw will continue to attempt to call.

## 2019-07-30 NOTE — Progress Notes (Signed)
Speech Language Pathology Weekly Progress and Session Note  Patient Details  Name: Brittany [name] MRN: [id number] Date of Birth: 11/05/1960  Beginning of progress report period: July 24, 2019 End of progress report period: July 30, 2019   Short Term Goals: Week 1: SLP Short Term Goal 1 (Week 1): Pt will use compensatory memory strategies and/or aids to recall new complex information with Supervision A verbal/visual cues. SLP Short Term Goal 1 - Progress (Week 1): Progressing toward goal SLP Short Term Goal 2 (Week 1): Pt will demonstrate ability to problem solve mildly complex functional tasks with Min A verbal/visual cues. SLP Short Term Goal 2 - Progress (Week 1): Met SLP Short Term Goal 3 (Week 1): Pt will anticipate 2 tasks she will need assistance with at d/c based on current level of functioning with Min A cues. SLP Short Term Goal 3 - Progress (Week 1): Met    New Short Term Goals: Week 2: SLP Short Term Goal 1 (Week 2): STG=LTG due to remaining length of stay  Weekly Progress Updates: Pt has made functional gains and met 2 out of 3 short term goals this reporting period. Pt has shown improvements in sem-complex to complex problem solving and emergent and anticipatory awareness, requiring Supervision A, however increased Min A cueing is required for short term recall with use of aids and strategies. Pt education is ongoing; no family has been present for ST sessions and would benefit from education regarding pt's current level of cognitive functioning prior to discharge. Pt would continue to benefit from skilled ST while inpatient in order to maximize functional independence and reduce burden of care prior to discharge. Anticipate that pt will need 24/7 supervision at discharge in addition to [facility] follow up at next level of care.       Intensity: Minumum of 1-2 x/day, 30 to 90 minutes Frequency: 3 to 5 out of 7 days Duration/Length of Stay: 08/04/19 Treatment/Interventions: Cognitive  remediation/compensation;Cueing hierarchy;Functional tasks;Therapeutic Activities;Patient/family education;Internal/external aids    Arbutus Leas 07/30/2019, 7:00 AM

## 2019-07-30 NOTE — Significant Event (Addendum)
Rapid Response Event Note  Overview: Hypoglycemia with seizure like activity  Initial Focused Assessment: I was notified by nursing staff of pt acting different and not responding. Previously alert and oriented x4. CBG 11. Pt diaphoretic and rigid. Incontinent of urine. Not following commands. Spontaneously breathing. Glucagon IM given and PIV started after multiple attempts. D50 1 amp given. Pt woke up and following commands and answering questions shortly after d50.  Nursing staff contacting rehab service for further orders.  HR 61, BP 135/98 (108), RR 22 with RA sats 90%  Interventions: -glucagon IM -PIV -1 amp D50 -Repeat CBG 278  Plan of Care  -pt needs to transfer to medical bed for management -Notify primary service of events and further orders -repeat VS q1 x2 with CBG -If pt has decreased LOC,  check CBG and follow standing orders.    Event Summary: Call received 2052 Arrived 2055 Call ended 2130  Madelynn Done

## 2019-07-30 NOTE — Progress Notes (Signed)
Speech Language Pathology Daily Session Note  Patient Details  Name: Brittany [name] MRN: [id number] Date of Birth: January 24, 1961  Today's Date: 07/30/2019 SLP Individual Time: 1300-1326 SLP Individual Time Calculation (min): 26 min  Short Term Goals: Week 1: SLP Short Term Goal 1 (Week 1): Pt will use compensatory memory strategies and/or aids to recall new complex information with Supervision A verbal/visual cues. SLP Short Term Goal 2 (Week 1): Pt will demonstrate ability to problem solve mildly complex functional tasks with Min A verbal/visual cues. SLP Short Term Goal 3 (Week 1): Pt will anticipate 2 tasks she will need assistance with at d/c based on current level of functioning with Min A cues.  Skilled Therapeutic Interventions: Pt was seen for skilled ST targeting cognition. Pt minimally engaged and intermittently verbally agitated, perseverative on being upset with lunch options and an event yesterday which she would not elaborate or provide any details regarding. However, pt agreeable to education regarding compensatory memory strategies. Provided pt with handout regarding these strategies, and verbal review focused on note taking, avoiding multitasking, and routines. Handout left at bedside. Pt denied any difficulty with short term memory, despite gentle feedback for SLP. Pt also forgot SLP had gotten her a drink earlier in session, requiring cues and feedback for recall with 15 minute delay. Pt left laying in bed with alarm set and needs within reach. Continue per current plan of care.         Pain Pain Assessment Pain Scale: 0-10 Pain Score: 7  Pain Location: Head  Therapy/Group: Individual Therapy  Arbutus Leas 07/30/2019, 6:57 AM

## 2019-07-30 NOTE — Progress Notes (Signed)
Occupational Therapy Session Note  Patient Details  Name: Brittany [name] MRN: [id number] Date of Birth: 1960/10/20  Today's Date: 07/30/2019 OT Individual Time: 0900-0940 OT Individual Time Calculation (min): 40 min  20 missed minutes  Short Term Goals: Week 2:  OT Short Term Goal 1 (Week 2): STG=LTGs secondary to short LOS  Skilled Therapeutic Interventions/Progress Updates:    Pt supine in bed with eyes closed this morning. OT making multiple attempt to awaken pt for OT intervention. OT checking with RN who reports pt was just up and sitting on EOB but has been very upset this morning. OT returns to room and attempts to awaken pt again. She becomes very tearful and reports being upset by something that was said to her in a therapy session yesterday. Pt was unable to recall what upset her. OT attempting to calm pt and provided her with peace blend aromatherapy. OT discussed pt's therapy schedule and pt declined morning sessions so that she could calm down and try to do her afternoon sessions. OT alerted staff. Pt remained in bed with call bell and all needed items within reach. Bed alarm activated.20 missed minutes secondary to pt's emotional state.  Therapy Documentation Precautions:  Precautions Precautions: Fall Restrictions Weight Bearing Restrictions: No General:   Vital Signs: Oxygen Therapy SpO2: 95 % (checked with ear lobe) O2 Device: Room Air Pain: Pain Assessment Pain Score: 7  ADL: ADL Eating: Set up Where Assessed-Eating: Chair Grooming: Supervision/safety Where Assessed-Grooming: Sitting at sink Upper Body Bathing: Contact guard Lower Body Bathing: Moderate assistance Upper Body Dressing: Contact guard Lower Body Dressing: Moderate assistance Toileting: Moderate assistance Toilet Transfer: Moderate assistance (Min-Mod) Toilet Transfer Method: Stand pivot Tub/Shower Transfer: Moderate assistance (hand held assist)   Therapy/Group: Individual  Therapy  Gypsy Decant 07/30/2019, 9:51 AM

## 2019-07-30 NOTE — Consult Note (Signed)
[facility] Face-to-Face Psychiatry Consult   Reason for Consult:  *Depression Referring Physician:  Dr. 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 female patient admitted with partial removal of pancreas, discoid lupus, uncontrolled diabetes, hypertension, cervical radiculopathy who was seen on the rehab floor.  Patient assessed by this Probation officer.  Upon entering the room patient was very verbally aggressive, presented with pressured speech, and ruminating about her lunch.  Patient will not engage with writer denied all psychiatric symptoms.  Denied any previous history of depression, bipolar, schizophrenia, and or mood disorder.  She also denies any substance abuse.  Denies any recent suicidal attempts, suicidal ideations, suicidal gestures and or suicidal thoughts.  Patient states" there is nothing in this world or anyone who is worth killing my damn self."  Patient is observed making a phone call to someone to ask them to bring her lunch.  She was very upset with them when she hung up the phone, and despite multiple attempts to engage patient continued to Consulting civil engineer.  Patient denies any help, further evaluation from psychiatry, and excuses writer from her room. "  If he can help me get some real damn food around here he might as well get the f**k out."   HPI: Brittany [name] is a 59 y.o. 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  She denied abdominal pain, fever chills, change in bowel habits or dysuria.  Denies chest pain, cough or shortness of breath   Past Psychiatric History: Patient is not forthcoming with information. She denies all psychiatric conditions at this time.   Risk to Self:   No Risk to Others:  No Prior Inpatient Therapy:  No Prior Outpatient Therapy:  No  Past Medical History:  Past Medical History:  Diagnosis Date  . Cancer ([place])   . Collagen vascular disease ([place])   . Diabetes mellitus without complication ([place])   . Hypertension   . Lupus [facility])     Past Surgical History:  Procedure Laterality Date  . PANCREATECTOMY    . spleenectomy     Family History:  Family History  Problem Relation Age of Onset  . Anxiety disorder Sister   . Breast cancer Maternal Aunt    Family Psychiatric  History: Denies Social History:  Social History   Substance and Sexual Activity  Alcohol Use Yes     Social History   Substance and Sexual Activity  Drug Use Not Currently    Social History   Socioeconomic History  . Marital status: Widowed    Spouse name: Not on file  . Number of children: Not on file  . Years of education: Not on file  . Highest education level: Not on file  Occupational History  . Not on file  Tobacco Use  . Smoking status: Current Every Day Smoker  . Smokeless tobacco:  Never Used  Substance and Sexual Activity  . Alcohol use: Yes  . Drug use: Not Currently  . Sexual activity: Not on file  Other Topics Concern  . Not on file  Social History Narrative  . Not on file   Social Determinants of Health   Financial Resource Strain:   . Difficulty of Paying Living Expenses:   Food Insecurity:   . Worried About Charity fundraiser in the Last Year:   . Arboriculturist in the Last Year:   Transportation Needs:    . Film/video editor (Medical):   Marland Kitchen Lack of Transportation (Non-Medical):   Physical Activity:   . Days of Exercise per Week:   . Minutes of Exercise per Session:   Stress:   . Feeling of Stress :   Social Connections:   . Frequency of Communication with Friends and Family:   . Frequency of Social Gatherings with Friends and Family:   . Attends Religious Services:   . Active Member of Clubs or Organizations:   . Attends Archivist Meetings:   Marland Kitchen Marital Status:    Additional Social History:    Allergies:   Allergies  Allergen Reactions  . Cephalosporins Itching  . Hydromorphone Itching  . Keflin [Cephalothin] Itching    Labs:  Results for orders placed or performed during the hospital encounter of 07/20/19 (from the past 48 hour(s))  Glucose, capillary     Status: Abnormal   Collection Time: 07/28/19  4:58 PM  Result Value Ref Range   Glucose-Capillary 147 (H) 70 - 99 mg/dL    Comment: Glucose reference range applies only to samples taken after fasting for at least 8 hours.  Glucose, capillary     Status: None   Collection Time: 07/28/19  9:07 PM  Result Value Ref Range   Glucose-Capillary 73 70 - 99 mg/dL    Comment: Glucose reference range applies only to samples taken after fasting for at least 8 hours.  Glucose, capillary     Status: Abnormal   Collection Time: 07/29/19  6:24 AM  Result Value Ref Range   Glucose-Capillary 109 (H) 70 - 99 mg/dL    Comment: Glucose reference range applies only to samples taken after fasting for at least 8 hours.  Glucose, capillary     Status: None   Collection Time: 07/29/19 11:24 AM  Result Value Ref Range   Glucose-Capillary 98 70 - 99 mg/dL    Comment: Glucose reference range applies only to samples taken after fasting for at least 8 hours.  Glucose, capillary     Status: Abnormal   Collection Time: 07/29/19  4:16 PM  Result Value Ref Range   Glucose-Capillary 151 (H) 70 - 99 mg/dL    Comment: Glucose reference  range applies only to samples taken after fasting for at least 8 hours.  Glucose, capillary     Status: Abnormal   Collection Time: 07/29/19  9:40 PM  Result Value Ref Range   Glucose-Capillary 197 (H) 70 - 99 mg/dL    Comment: Glucose reference range applies only to samples taken after fasting for at least 8 hours.  Glucose, capillary     Status: Abnormal   Collection Time: 07/30/19  6:38 AM  Result Value Ref Range   Glucose-Capillary 133 (H) 70 - 99 mg/dL    Comment: Glucose reference range applies only to samples taken after fasting for at least 8 hours.  Glucose, capillary     Status: Abnormal  Collection Time: 07/30/19 11:40 AM  Result Value Ref Range   Glucose-Capillary 162 (H) 70 - 99 mg/dL    Comment: Glucose reference range applies only to samples taken after fasting for at least 8 hours.    Current Facility-Administered Medications  Medication Dose Route Frequency Provider Last Rate Last Admin  . acetaminophen (TYLENOL) tablet 325-650 mg  325-650 mg Oral Q4H PRN Bary Leriche, PA-C   650 mg at 07/29/19 1147  . ALPRAZolam Duanne Moron) tablet 0.25 mg  0.25 mg Oral TID PRN [name], MD   0.25 mg at 07/28/19 1655  . alum & mag hydroxide-simeth (MAALOX/MYLANTA) 200-200-20 MG/5ML suspension 30 mL  30 mL Oral Q4H PRN Love, Pamela S, PA-C      . aspirin chewable tablet 81 mg  81 mg Oral Daily Bary Leriche, PA-C   81 mg at 07/30/19 2836  . atorvastatin (LIPITOR) tablet 80 mg  80 mg Oral Daily Bary Leriche, PA-C   80 mg at 07/30/19 6294  . benazepril (LOTENSIN) tablet 5 mg  5 mg Oral Daily Kirsteins, Luanna Salk, MD   5 mg at 07/30/19 0829  . bisacodyl (DULCOLAX) suppository 10 mg  10 mg Rectal Daily PRN Love, Pamela S, PA-C      . buPROPion (WELLBUTRIN XL) 24 hr tablet 300 mg  300 mg Oral Daily Bary Leriche, PA-C   300 mg at 07/30/19 0829  . carbamide peroxide (DEBROX) 6.5 % OTIC (EAR) solution 5 drop  5 drop Right EAR BID Kirsteins, Luanna Salk, MD   5 drop at 07/30/19 0830  .  carvedilol (COREG) tablet 3.125 mg  3.125 mg Oral BID WC Charlett Blake, MD   3.125 mg at 07/30/19 0829  . cyclobenzaprine (FLEXERIL) tablet 5 mg  5 mg Oral TID PRN Bary Leriche, PA-C   5 mg at 07/25/19 0108  . diphenhydrAMINE (BENADRYL) 12.5 MG/5ML elixir 12.5-25 mg  12.5-25 mg Oral Q6H PRN Love, Pamela S, PA-C      . enoxaparin (LOVENOX) injection 40 mg  40 mg Subcutaneous QHS Love, Pamela S, PA-C   40 mg at 07/29/19 2143  . feeding supplement (GLUCERNA SHAKE) (GLUCERNA SHAKE) liquid 237 mL  237 mL Oral TID BM Kirsteins, Luanna Salk, MD   237 mL at 07/29/19 1454  . folic acid (FOLVITE) tablet 1 mg  1 mg Oral Daily Love, Ivan Anchors, PA-C   1 mg at 07/30/19 7654  . gabapentin (NEURONTIN) capsule 300 mg  300 mg Oral QHS [name], MD   300 mg at 07/29/19 2143  . guaiFENesin-dextromethorphan (ROBITUSSIN DM) 100-10 MG/5ML syrup 5-10 mL  5-10 mL Oral Q6H PRN Love, Ivan Anchors, PA-C      . HYDROcodone-acetaminophen (NORCO/VICODIN) 5-325 MG per tablet 1-2 tablet  1-2 tablet Oral Q4H PRN Bary Leriche, PA-C   1 tablet at 07/30/19 0549  . hydroxychloroquine (PLAQUENIL) tablet 200 mg  200 mg Oral Daily Bary Leriche, PA-C   200 mg at 07/30/19 6503  . hydrOXYzine (ATARAX/VISTARIL) tablet 10 mg  10 mg Oral BID PRN Reesa Chew S, PA-C      . insulin aspart (novoLOG) injection 0-5 Units  0-5 Units Subcutaneous QHS Bary Leriche, PA-C   5 Units at 07/26/19 2110  . insulin aspart (novoLOG) injection 0-9 Units  0-9 Units Subcutaneous TID WC Bary Leriche, PA-C   1 Units at 07/30/19 5465  . insulin aspart protamine- aspart (NOVOLOG MIX 70/30) injection 28 Units  28 Units Subcutaneous BID WC [name], MD   28 Units at 07/30/19 [phone]  . meclizine (ANTIVERT) tablet 6.25 mg  6.25 mg Oral TID Love, Pamela S, PA-C   6.25 mg at 07/30/19 0830  . metFORMIN (GLUCOPHAGE) tablet 500 mg  500 mg Oral Q breakfast Kirsteins, Luanna Salk, MD   500 mg at 07/30/19 0762  . mirtazapine (REMERON) tablet 15 mg  15 mg Oral  QHS Charlett Blake, MD   15 mg at 07/29/19 2144  . multivitamin with minerals tablet 1 tablet  1 tablet Oral Daily Bary Leriche, PA-C   1 tablet at 07/30/19 2633  . Muscle Rub CREA   Topical BID PRN Kirsteins, Luanna Salk, MD      . nicotine polacrilex (NICORETTE) gum 2 mg  2 mg Oral PRN Bary Leriche, PA-C   2 mg at 07/20/19 1431  . oxybutynin (DITROPAN) tablet 5 mg  5 mg Oral QHS Kirsteins, Luanna Salk, MD   5 mg at 07/29/19 2144  . polyethylene glycol (MIRALAX / GLYCOLAX) packet 17 g  17 g Oral Daily PRN Love, Pamela S, PA-C      . polyethylene glycol (MIRALAX / GLYCOLAX) packet 17 g  17 g Oral QODAY LoveIvan Anchors, PA-C   17 g at 07/28/19 1030  . prochlorperazine (COMPAZINE) tablet 5-10 mg  5-10 mg Oral Q6H PRN Love, Pamela S, PA-C       Or  . prochlorperazine (COMPAZINE) injection 5-10 mg  5-10 mg Intramuscular Q6H PRN Bary Leriche, PA-C   10 mg at 07/21/19 2307   Or  . prochlorperazine (COMPAZINE) suppository 12.5 mg  12.5 mg Rectal Q6H PRN Love, Pamela S, PA-C      . rOPINIRole (REQUIP) tablet 0.5 mg  0.5 mg Oral QHS Charlett Blake, MD   0.5 mg at 07/29/19 2143  . sodium phosphate (FLEET) 7-19 GM/118ML enema 1 enema  1 enema Rectal Once PRN Love, Pamela S, PA-C      . topiramate (TOPAMAX) tablet 50 mg  50 mg Oral BID Charlett Blake, MD   50 mg at 07/30/19 3545    Musculoskeletal: Strength & Muscle Tone: within normal limits Gait & Station: normal Patient leans: N/A  Psychiatric Specialty Exam: Physical Exam  Review of Systems  Blood pressure 104/72, pulse (!) 104, temperature 99.6 F (37.6 C), resp. rate 18, SpO2 99 %.There is no height or weight on file to calculate BMI.  General Appearance: Fairly Groomed  Eye Contact:  Fair  Speech:  Clear and Coherent and Pressured  Volume:  Increased  Mood:  Angry, Anxious and Irritable  Affect:  Blunt and Labile  Thought Process:  Linear and Descriptions of Associations: Tangential  Orientation:  Full (Time, Place, and  Person)  Thought Content:  Logical, Rumination and Tangential  Suicidal Thoughts:  No  Homicidal Thoughts:  No  Memory:  Immediate;   Fair Recent;   Fair  Judgement:  Poor  Insight:  Shallow  Psychomotor Activity:  Normal  Concentration:  Concentration: Fair and Attention Span: Fair  Recall:  [employer] of Knowledge:  Fair  Language:  Good  Akathisia:  Negative  Handed:  Right  AIMS (if indicated):     Assets:  Agricultural consultant Housing Leisure Time Physical Health Social Support  ADL's:  Intact  Cognition:  WNL  Sleep:        Treatment Plan Summary: Plan Patient declines treatment at this time. She  does not meet criteria for IVC at this time. Please reconsult with psychiatry if you have any additional concerns.     Disposition: No evidence of imminent risk to self or others at present.   Patient does not meet criteria for psychiatric inpatient admission. Patient denies any treatment and does not forward consent to speak with her family for collateral.   Suella Broad, FNP 07/30/2019 1:50 PM

## 2019-07-30 NOTE — Progress Notes (Signed)
Occupational Therapy Session Note  Patient Details  Name: Brittany [name] MRN: [id number] Date of Birth: [birth year]  Today's Date: 07/30/2019 OT Individual Time: 1500-1600 OT Individual Time Calculation (min): 60 min    Short Term Goals: Week 2:  OT Short Term Goal 1 (Week 2): STG=LTGs secondary to short LOS  Skilled Therapeutic Interventions/Progress Updates:    Upon entering the room, pt seated on EOB and requesting to shower. Pt standing from bed and ambulating in room with RW to clothing. Pt becoming upset and counting clothing items and reports family members are keeping there things. Pt throwing clothing on floor all around her and unable to be redirected. Pt going to sit on EOB and then reports headache and feeling unwell. OT continues to redirect pt throughout session. RN arrives and gives medication and takes vitals(WNLs). Pt then going through bags in bed to find something to wear. OT informed pt that she only has 20 minutes to shower as therapist has been present for 40 minutes. Pt reviews schedule and clock to confirm. Pt ambulates to bathroom with supervision. Pt doffs clothing with supervision and transfers into shower and bathing while seated on TTB with supervision. NT arrived to continue to provide supervision for pt.   Therapy Documentation Precautions:  Precautions Precautions: Fall Restrictions Weight Bearing Restrictions: No   Pain: Pain Assessment Pain Scale: Faces Faces Pain Scale: No hurt ADL: ADL Eating: Set up Where Assessed-Eating: Chair Grooming: Supervision/safety Where Assessed-Grooming: Sitting at sink Upper Body Bathing: Contact guard Lower Body Bathing: Moderate assistance Upper Body Dressing: Contact guard Lower Body Dressing: Moderate assistance Toileting: Moderate assistance Toilet Transfer: Moderate assistance (Min-Mod) Toilet Transfer Method: Stand pivot Tub/Shower Transfer: Moderate assistance (hand held assist)   Therapy/Group:  Individual Therapy  Gypsy Decant 07/30/2019, 4:45 PM

## 2019-07-30 NOTE — Plan of Care (Signed)
  Problem: Consults Goal: RH STROKE PATIENT EDUCATION Description: See Patient Education module for education specifics  Outcome: Progressing   Problem: RH BOWEL ELIMINATION Goal: RH STG MANAGE BOWEL WITH ASSISTANCE Description: STG Manage Bowel with Mod I Assistance. Outcome: Progressing   Problem: RH SAFETY Goal: RH STG ADHERE TO SAFETY PRECAUTIONS W/ASSISTANCE/DEVICE Description: STG Adhere to Safety Precautions With Supervision Assistance/Device. Outcome: Progressing   Problem: RH COGNITION-NURSING Goal: RH STG ANTICIPATES NEEDS/CALLS FOR ASSIST W/ASSIST/CUES Description: STG Anticipates Needs/Calls for Assist With Supervision Assistance/Cues. Outcome: Progressing   Problem: RH KNOWLEDGE DEFICIT Goal: RH STG INCREASE KNOWLEDGE OF DIABETES Description: Pt will verbalize understanding of diabetes management including diet, exercise, and medication management with cues/reminders assist/cues using educational materials provided by staff.  Outcome: Progressing Goal: RH STG INCREASE KNOWLEDGE OF STROKE PROPHYLAXIS Description: Pt will be able to verbalize stroke risk factors and prevention techniques including smoking cessation, diet management, blood sugar control, and follow up care with physician with cues/reminders assist/cues using educational materials provided by staff.  Outcome: Progressing

## 2019-07-30 NOTE — Progress Notes (Signed)
This nurse was notified at 2045 by Erin NT, pt found diaphoretic, rigid, clinching fists/mouth/elgs, not responding to name or following commands, fighting and aggressively with staff and urinated on self. VS obtained, CBG reading of 11. Lysle Morales Charge RN notified, rapid response [employer].  2058: IM glucagen given, Pt still not responding to verbal stimuli, flghting staff while multiple IV attempts being made, pt sweating and clinching jaw.  IV access by David,RN. AMP of D50 given.  CBG rechecked at 2123: 278; Pt became Alert but not following commands or talking. 2157: CBG 182, Pt A&O x4, No signs of distress noted. Denies being in pain. C/O being cold. VS WNL, unable to get temp reading. Pt had full linen change and clothes changed due to being soiled. 2258: CBG 97, 1 Glucerana Given, pt diaphoretic, A&O x4, no signs of distress, C/O being cold. Oral and Axillary temp not reading. - rectal temp of 92.7. - David, Rapid response notified, Charge RN Lysle Morales Notified, Provider Danella Sensing notified by Lysle Morales RN. 2333: CBG 119; Pt ate 1 [place] sandwich, and 1 diet ginger ale. Pt c/o headache. Denies NV, or dizziness.Kpad and blankets added to the pt to help warm the pt.   Pt resting in bed at this time, no obvious signs of distress noted.call light in reach.

## 2019-07-30 NOTE — Progress Notes (Signed)
[facility] PHYSICAL MEDICINE & REHABILITATION PROGRESS NOTE   Subjective/Complaints:  Tearful this am Very labile, angry at times, states a PT made her hurt and angry by something that was said but pt cannot recal what it was   ROS: Patient denies N/V/D , no CP or SOB    Objective:   No results found. No results for input(s): WBC, HGB, HCT, PLT in the last 72 hours. No results for input(s): NA, K, CL, CO2, GLUCOSE, BUN, CREATININE, CALCIUM in the last 72 hours.  Intake/Output Summary (Last 24 hours) at 07/30/2019 0920 Last data filed at 07/29/2019 1829 Gross per 24 hour  Intake 342 ml  Output --  Net 342 ml     Physical Exam: Vital Signs Blood pressure 109/70, pulse (!) 107, temperature 98.3 F (36.8 C), resp. rate 19, SpO2 95 %. General: No acute distress Mood and affect are appropriate Heart: Regular rate and rhythm no rubs murmurs or extra sounds Lungs: Clear to auscultation, breathing unlabored, no rales or wheezes Abdomen: Positive bowel sounds, soft nontender to palpation, nondistended Extremities: No clubbing, cyanosis, or edema Skin: No evidence of breakdown, no evidence of rash  Neurologic: Cranial nerves II through XII intact, motor strength is 5/5 in bilateral deltoid, bicep, tricep, grip, hip flexor, knee extensors, ankle dorsiflexor and plantar flexor Sensation intact to LT both feet.  Pedal pulses are normal  Musculoskeletal: Full range of motion in all 4 extremities. No joint swelling    Assessment/Plan: 1. 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  Physiatrist is providing close team supervision and 24 hour management of active medical problems listed below.  Physiatrist and rehab team continue to assess barriers to discharge/monitor patient progress toward functional and medical goals  Care Tool:  Bathing    Body parts bathed by  patient: Right arm, Abdomen, Chest, Left arm, Front perineal area, Left upper leg, Face, Right upper leg, Right lower leg, Left lower leg, Buttocks   Body parts bathed by helper: Buttocks     Bathing assist Assist Level: Contact Guard/Touching assist     Upper Body Dressing/Undressing Upper body dressing   What is the patient wearing?: Pull over shirt, Bra    Upper body assist Assist Level: Minimal Assistance - Patient > 75%    Lower Body Dressing/Undressing Lower body dressing      What is the patient wearing?: Pants, Underwear/pull up     Lower body assist Assist for lower body dressing: Contact Guard/Touching assist     Toileting Toileting    Toileting assist Assist for toileting: Minimal Assistance - Patient > 75%     Transfers Chair/bed transfer  Transfers assist     Chair/bed transfer assist level: Contact Guard/Touching assist     Locomotion Ambulation   Ambulation assist      Assist level: Moderate Assistance - Patient 50 - 74% Assistive device: Hand held assist Max distance: 10   Walk 10 feet activity   Assist     Assist level: Moderate Assistance - Patient - 50 - 74% Assistive device: Hand held assist   Walk 50 feet activity   Assist Walk 50 feet with 2 turns activity did not occur: Safety/medical concerns         Walk 150 feet activity   Assist Walk 150 feet activity did not occur: Safety/medical concerns         Walk 10 feet on uneven  surface  activity   Assist     Assist level: Moderate Assistance - Patient - 50 - 74% Assistive device: Aeronautical engineer Will patient use wheelchair at discharge?: No             Wheelchair 50 feet with 2 turns activity    Assist            Wheelchair 150 feet activity     Assist          Blood pressure 109/70, pulse (!) 107, temperature 98.3 F (36.8 C), resp. rate 19, SpO2 95 %.  Medical Problem List and Plan: 1.Functional  and mobility deficitssecondary to left cerebellar infarct CIR PT, OT, SLP  -ELOS/Goals: tent D/C 6/22,  2. Antithrombotics: -DVT/anticoagulation:Pharmaceutical:Lovenox -antiplatelet therapy: On low dose ASA 3.C6/C7 radiculopathy/Pain Management:s/p ESI- 3/23.Pt with notable multifactorial spondylosis in mid to lower cervical spine -add heating pad.  -utilize muscle relaxant, Muscle balm - therapy addressing posture, ROM -Willalsocontinue Hydrocodone prn.  On high dose gabpentin at night for presumed neuropathic pain but no clinical signs of a severe neuropathy this was stopped.    -continue Topirimate for HA  4. Mood:LCSW to follow for evaluation and support.- hx depression with lability ask psych to eval - pt ok with this  -antipsychotic agents: N/A  -anxiety is a barrier to participation   -add low dose xanax prn in addition to atarax   -request neuropsych eval this week 5. Neuropsych: This patientiscapable of making decisions on herown behalf. 6. Skin/Wound Care:Routine pressure relief measures. 7. Fluids/Electrolytes/Nutrition:Monitor I/O. Check lytes in am. Low BMI with moderate protein malnutrition  8. HTN: Monitor BP tid--on Lisinoprilwith BP still poorly controlled. SBP >100's. Will likely need titration of meds. Vitals:   07/30/19 0546 07/30/19 0556  BP: 109/70   Pulse: (!) 107   Resp: 19   Temp: 98.3 F (36.8 C)   SpO2: (!) 88% 95%  controlled on Lotensin 6/15, will reduce and start low dose coreg for tachy 9. T2DM post pancreatectomy: Poorly controlled with Hgb A1C- 12.5. Monitor BS ac/hs--poor controlled. Resume 70/30 insulin. Was not usingmetforminPTA.Will d/c Glucerna between meals as likely affecting BS and change to Ensure Max which has 5g net carbs.  CBG (last 3)  Recent Labs    07/29/19 1616 07/29/19 2140 07/30/19 0638  GLUCAP 151* 197* 133*   Adjusted 70/30 dose  -6/17 28U BID - controlled 10 Collagen Vascular disease/discoid Lupus: Resume Plaquenil and folic acid.  11. Pancreatic insufficiency/S/p Pancreatectomy: Resume pancrease tid ac. Will resume psyllium and loperemide prn for chronic diarrhea.  12. Dyslipidemia: On high dose statin.  13. Dizziness:Continue low dose antivert. May need vestibular evaluation. 17. H/o Major depressive disorder: Continue Wellbutrin--resume atarax bidprn(for anxiety). Resumed  Remeron.  -add prn xanax as above 15.Tobacco abuse: Willing to try nicotine gum--will order. 16. Vitamin B 12 deficiency: Gets IM injection every month.  17.  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  Insomnia multifactorial-added ropirinole for restless legs and oxybutnin for spastic bladder, rx anxiety as above  LOS: 10 days A FACE TO Malin E Karo Rog 07/30/2019, 9:20 AM

## 2019-07-31 ENCOUNTER — Inpatient Hospital Stay (HOSPITAL_COMMUNITY): Payer: Medicare Other | Admitting: Occupational Therapy

## 2019-07-31 ENCOUNTER — Encounter (HOSPITAL_COMMUNITY): Payer: Medicare Other | Admitting: Psychology

## 2019-07-31 ENCOUNTER — Inpatient Hospital Stay (HOSPITAL_COMMUNITY): Payer: Medicare Other | Admitting: Physical Therapy

## 2019-07-31 DIAGNOSIS — F0630 Mood disorder due to known physiological condition, unspecified: Secondary | ICD-10-CM | POA: Insufficient documentation

## 2019-07-31 DIAGNOSIS — F32A Depression, unspecified: Secondary | ICD-10-CM | POA: Insufficient documentation

## 2019-07-31 DIAGNOSIS — F331 Major depressive disorder, recurrent, moderate: Secondary | ICD-10-CM | POA: Diagnosis present

## 2019-07-31 LAB — COMPREHENSIVE METABOLIC PANEL
ALT: 21 U/L (ref 0–44)
AST: 26 U/L (ref 15–41)
Albumin: 3 g/dL — ABNORMAL LOW (ref 3.5–5.0)
Alkaline Phosphatase: 63 U/L (ref 38–126)
Anion gap: 12 (ref 5–15)
BUN: 27 mg/dL — ABNORMAL HIGH (ref 6–20)
CO2: 17 mmol/L — ABNORMAL LOW (ref 22–32)
Calcium: 9.2 mg/dL (ref 8.9–10.3)
Chloride: 104 mmol/L (ref 98–111)
Creatinine, Ser: 0.68 mg/dL (ref 0.44–1.00)
GFR calc Af Amer: >60 mL/min (ref >60)
GFR calc non Af Amer: >60 mL/min (ref >60)
Glucose, Bld: 217 mg/dL — ABNORMAL HIGH (ref 70–99)
Potassium: 4.6 mmol/L (ref 3.5–5.1)
Sodium: 133 mmol/L — ABNORMAL LOW (ref 135–145)
Total Bilirubin: 0.7 mg/dL (ref 0.3–1.2)
Total Protein: 7.1 g/dL (ref 6.5–8.1)

## 2019-07-31 LAB — GLUCOSE, CAPILLARY
Glucose-Capillary: 106 mg/dL — ABNORMAL HIGH (ref 70–99)
Glucose-Capillary: 11 mg/dL — CL (ref 70–99)
Glucose-Capillary: 131 mg/dL — ABNORMAL HIGH (ref 70–99)
Glucose-Capillary: 159 mg/dL — ABNORMAL HIGH (ref 70–99)
Glucose-Capillary: 160 mg/dL — ABNORMAL HIGH (ref 70–99)
Glucose-Capillary: 174 mg/dL — ABNORMAL HIGH (ref 70–99)
Glucose-Capillary: 177 mg/dL — ABNORMAL HIGH (ref 70–99)
Glucose-Capillary: 186 mg/dL — ABNORMAL HIGH (ref 70–99)
Glucose-Capillary: 191 mg/dL — ABNORMAL HIGH (ref 70–99)
Glucose-Capillary: 205 mg/dL — ABNORMAL HIGH (ref 70–99)
Glucose-Capillary: 218 mg/dL — ABNORMAL HIGH (ref 70–99)
Glucose-Capillary: 224 mg/dL — ABNORMAL HIGH (ref 70–99)
Glucose-Capillary: 225 mg/dL — ABNORMAL HIGH (ref 70–99)
Glucose-Capillary: 240 mg/dL — ABNORMAL HIGH (ref 70–99)
Glucose-Capillary: 248 mg/dL — ABNORMAL HIGH (ref 70–99)
Glucose-Capillary: 262 mg/dL — ABNORMAL HIGH (ref 70–99)
Glucose-Capillary: 284 mg/dL — ABNORMAL HIGH (ref 70–99)
Glucose-Capillary: 305 mg/dL — ABNORMAL HIGH (ref 70–99)
Glucose-Capillary: 380 mg/dL — ABNORMAL HIGH (ref 70–99)
Glucose-Capillary: 397 mg/dL — ABNORMAL HIGH (ref 70–99)
Glucose-Capillary: 452 mg/dL — ABNORMAL HIGH (ref 70–99)
Glucose-Capillary: 59 mg/dL — ABNORMAL LOW (ref 70–99)
Glucose-Capillary: 86 mg/dL (ref 70–99)

## 2019-07-31 LAB — CBC
HCT: 37.7 % (ref 36.0–46.0)
Hemoglobin: 12.9 g/dL (ref 12.0–15.0)
MCH: 31.4 pg (ref 26.0–34.0)
MCHC: 34.2 g/dL (ref 30.0–36.0)
MCV: 91.7 fL (ref 80.0–100.0)
Platelets: 519 10*3/uL — ABNORMAL HIGH (ref 150–400)
RBC: 4.11 MIL/uL (ref 3.87–5.11)
RDW: 11.9 % (ref 11.5–15.5)
WBC: 8 10*3/uL (ref 4.0–10.5)
nRBC: 0 % (ref 0.0–0.2)

## 2019-07-31 MED ORDER — DIVALPROEX SODIUM 125 MG PO CSDR
250.0000 mg | DELAYED_RELEASE_CAPSULE | Freq: Two times a day (BID) | ORAL | Status: DC
  Administered 2019-07-31 – 2019-08-04 (×8): 250 mg via ORAL
  Filled 2019-07-31 (×8): qty 2

## 2019-07-31 MED ORDER — CITALOPRAM HYDROBROMIDE 10 MG PO TABS
10.0000 mg | ORAL_TABLET | Freq: Every day | ORAL | Status: DC
  Administered 2019-07-31 – 2019-08-04 (×5): 10 mg via ORAL
  Filled 2019-07-31 (×5): qty 1

## 2019-07-31 MED ORDER — BUPROPION HCL ER (XL) 150 MG PO TB24
150.0000 mg | ORAL_TABLET | Freq: Every day | ORAL | Status: DC
  Administered 2019-08-01 – 2019-08-02 (×2): 150 mg via ORAL
  Filled 2019-07-31 (×3): qty 1

## 2019-07-31 MED ORDER — DEXTROSE-NACL 5-0.45 % IV SOLN: INTRAVENOUS | Status: DC

## 2019-07-31 MED ORDER — DEXTROSE 50 % IV SOLN
12.5000 g | INTRAVENOUS | Status: AC
  Administered 2019-07-31: 25 g via INTRAVENOUS

## 2019-07-31 MED ORDER — GLUCOSE 4 G PO CHEW
CHEWABLE_TABLET | ORAL | Status: AC
  Filled 2019-07-31: qty 1

## 2019-07-31 NOTE — Plan of Care (Signed)
°  Problem: RH Balance Goal: LTG Patient will maintain dynamic standing balance (PT) Description: LTG:  Patient will maintain dynamic standing balance with assistance during mobility activities (PT) Flowsheets (Taken 07/31/2019 0751) LTG: Pt will maintain dynamic standing balance during mobility activities with:: Supervision/Verbal cueing Note: Downgraded due to slow progress   Problem: RH Bed to Chair Transfers Goal: LTG Patient will perform bed/chair transfers w/assist (PT) Description: LTG: Patient will perform bed to chair transfers with assistance (PT). Flowsheets (Taken 07/31/2019 0751) LTG: Pt will perform Bed to Chair Transfers with assistance level: Supervision/Verbal cueing Note: Downgraded due to slow progress   Problem: RH Car Transfers Goal: LTG Patient will perform car transfers with assist (PT) Description: LTG: Patient will perform car transfers with assistance (PT). Flowsheets (Taken 07/31/2019 0751) LTG: Pt will perform car transfers with assist:: Supervision/Verbal cueing Note: Downgraded due to slow progress   Problem: RH Furniture Transfers Goal: LTG Patient will perform furniture transfers w/assist (OT/PT) Description: LTG: Patient will perform furniture transfers  with assistance (OT/PT). Flowsheets (Taken 07/31/2019 0751) LTG: Pt will perform furniture transfers with assist:: Supervision/Verbal cueing Note: Downgraded due to slow progress   Problem: RH Ambulation Goal: LTG Patient will ambulate in controlled environment (PT) Description: LTG: Patient will ambulate in a controlled environment, # of feet with assistance (PT). Flowsheets Taken 07/31/2019 0751 by [name], PT LTG: Pt will ambulate in controlled environ  assist needed:: Supervision/Verbal cueing Taken 07/21/2019 1702 by [name], PT LTG: Ambulation distance in controlled environment: 150 Note: Downgraded due to slow progress Goal: LTG Patient will ambulate in home environment  (PT) Description: LTG: Patient will ambulate in home environment, # of feet with assistance (PT). Flowsheets Taken 07/31/2019 0751 by [name], PT LTG: Pt will ambulate in home environ  assist needed:: Supervision/Verbal cueing Taken 07/21/2019 1702 by [name], PT LTG: Ambulation distance in home environment: 50 Note: Downgraded due to slow progress      Barrie Folk PT, DPT  7:53 AM 07/31/19

## 2019-07-31 NOTE — Consult Note (Signed)
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 female patient admitted with CVA.  Patient seen and evaluated in person by this provider.  She was tearful and upset when this provider came into the room and she had just finished speaking with the neuropsychologist.  Is very upset about family issues and emotional.  Denies any suicidal/homicidal ideations, hallucinations, paranoia, or other psychiatric concerns.  Did discuss medications briefly and was able to calm her down the refocusing her on her physical needs.  Agreeable for adjustments to her medications and recommendations and plan below.  Dr. Dwyane Dee reviewed this client and concurs with the findings.  HPI per MD:  PVV:ZSMOL D. Newland is a 59 year old female with history of T2DM, depression, discoid Lupus, tobacco use who was admitted to [facility] on 07/12/19 with 3 day history of N/V and dizziness. She was found to have DKA with BS 406 and was treated with IV insulin and fluid bolus. 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 2D echo with bubble study showed EF 55-60% with mild LVH, moderately elevated pulmonary pressure and was negative for shunt. Stroke felt to be due to small vessel disease and ASA recommended by Dr. Doy Mince. She developed HA with follow up [place] showing increase in edema and hydrocodone  added for pain management. Headaches improving but she continues to have tachycardia as well as dizziness affecting mobility and ADLs. CIR recommended due to functional deficits.   Past Psychiatric History: depression, anxiety  Risk to Self:  None Risk to Others:  None Prior Inpatient Therapy:  Yes Prior Outpatient Therapy:  PCP management of medications  Past Medical History:  Past Medical History:  Diagnosis Date  . Cancer ([place])   . Collagen vascular disease ([place])   . Diabetes mellitus without complication ([place])   . Hypertension   . Lupus [facility])     Past Surgical History:  Procedure Laterality Date  . PANCREATECTOMY    . spleenectomy     Family History:  Family History  Problem Relation Age of Onset  . Anxiety disorder Sister   . Breast cancer Maternal Aunt    Family Psychiatric  History: see above Social History:  Social History   Substance and Sexual Activity  Alcohol Use Yes     Social History   Substance and Sexual Activity  Drug Use Not Currently    Social History   Socioeconomic History  . Marital status: Widowed    Spouse name: Not on file  . Number of children: Not on file  . Years of education: Not on file  . Highest education level: Not on file  Occupational History  . Not on file  Tobacco Use  . Smoking status: Current Every Day Smoker  . Smokeless tobacco: Never Used  Substance and Sexual Activity  . Alcohol use: Yes  . Drug use: Not Currently  . Sexual activity: Not  on file  Other Topics Concern  . Not on file  Social History Narrative  . Not on file   Social Determinants of Health   Financial Resource Strain:   . Difficulty of Paying Living Expenses:   Food Insecurity:   . Worried About Charity fundraiser in the Last Year:   . Arboriculturist in the Last Year:   Transportation Needs:   . Film/video editor (Medical):   Marland Kitchen Lack of Transportation (Non-Medical):   Physical Activity:   . Days of Exercise per Week:   . Minutes  of Exercise per Session:   Stress:   . Feeling of Stress :   Social Connections:   . Frequency of Communication with Friends and Family:   . Frequency of Social Gatherings with Friends and Family:   . Attends Religious Services:   . Active Member of Clubs or Organizations:   . Attends Archivist Meetings:   Marland Kitchen Marital Status:    Additional Social History:    Allergies:   Allergies  Allergen Reactions  . Cephalosporins Itching  . Hydromorphone Itching  . Keflin [Cephalothin] Itching    Labs:  Results for orders placed or performed during the hospital encounter of 07/20/19 (from the past 48 hour(s))  Glucose, capillary     Status: None   Collection Time: 07/29/19 11:24 AM  Result Value Ref Range   Glucose-Capillary 98 70 - 99 mg/dL    Comment: Glucose reference range applies only to samples taken after fasting for at least 8 hours.  Glucose, capillary     Status: Abnormal   Collection Time: 07/29/19  4:16 PM  Result Value Ref Range   Glucose-Capillary 151 (H) 70 - 99 mg/dL    Comment: Glucose reference range applies only to samples taken after fasting for at least 8 hours.  Glucose, capillary     Status: Abnormal   Collection Time: 07/29/19  9:40 PM  Result Value Ref Range   Glucose-Capillary 197 (H) 70 - 99 mg/dL    Comment: Glucose reference range applies only to samples taken after fasting for at least 8 hours.  Glucose, capillary     Status: Abnormal   Collection Time: 07/30/19  6:38 AM  Result Value Ref Range   Glucose-Capillary 133 (H) 70 - 99 mg/dL    Comment: Glucose reference range applies only to samples taken after fasting for at least 8 hours.  Glucose, capillary     Status: Abnormal   Collection Time: 07/30/19 11:40 AM  Result Value Ref Range   Glucose-Capillary 162 (H) 70 - 99 mg/dL    Comment: Glucose reference range applies only to samples taken after fasting for at least 8 hours.  Glucose, capillary     Status: Abnormal   Collection Time:  07/30/19  4:52 PM  Result Value Ref Range   Glucose-Capillary 51 (L) 70 - 99 mg/dL    Comment: Glucose reference range applies only to samples taken after fasting for at least 8 hours.  Glucose, capillary     Status: Abnormal   Collection Time: 07/30/19  5:24 PM  Result Value Ref Range   Glucose-Capillary 352 (H) 70 - 99 mg/dL    Comment: Glucose reference range applies only to samples taken after fasting for at least 8 hours.  Glucose, capillary     Status: Abnormal   Collection Time: 07/30/19  8:52 PM  Result Value Ref Range   Glucose-Capillary 11 (LL) 70 - 99 mg/dL  Comment: Glucose reference range applies only to samples taken after fasting for at least 8 hours.  Glucose, capillary     Status: Abnormal   Collection Time: 07/30/19  9:03 PM  Result Value Ref Range   Glucose-Capillary <10 (LL) 70 - 99 mg/dL    Comment: Glucose reference range applies only to samples taken after fasting for at least 8 hours.  Glucose, capillary     Status: Abnormal   Collection Time: 07/30/19  9:23 PM  Result Value Ref Range   Glucose-Capillary 278 (H) 70 - 99 mg/dL    Comment: Glucose reference range applies only to samples taken after fasting for at least 8 hours.  Glucose, random     Status: Abnormal   Collection Time: 07/30/19  9:47 PM  Result Value Ref Range   Glucose, Bld 132 (H) 70 - 99 mg/dL    Comment: Glucose reference range applies only to samples taken after fasting for at least 8 hours. Performed at [facility], [place] [address] [place], [place] [address]   Glucose, capillary     Status: Abnormal   Collection Time: 07/30/19  9:59 PM  Result Value Ref Range   Glucose-Capillary 182 (H) 70 - 99 mg/dL    Comment: Glucose reference range applies only to samples taken after fasting for at least 8 hours.  Glucose, capillary     Status: None   Collection Time: 07/30/19 10:58 PM  Result Value Ref Range   Glucose-Capillary 97 70 - 99 mg/dL    Comment: Glucose reference range applies  only to samples taken after fasting for at least 8 hours.  Glucose, capillary     Status: Abnormal   Collection Time: 07/30/19 11:33 PM  Result Value Ref Range   Glucose-Capillary 119 (H) 70 - 99 mg/dL    Comment: Glucose reference range applies only to samples taken after fasting for at least 8 hours.  Glucose, capillary     Status: Abnormal   Collection Time: 07/31/19 12:54 AM  Result Value Ref Range   Glucose-Capillary 59 (L) 70 - 99 mg/dL    Comment: Glucose reference range applies only to samples taken after fasting for at least 8 hours.  Glucose, capillary     Status: Abnormal   Collection Time: 07/31/19  1:50 AM  Result Value Ref Range   Glucose-Capillary 225 (H) 70 - 99 mg/dL    Comment: Glucose reference range applies only to samples taken after fasting for at least 8 hours.  Glucose, capillary     Status: Abnormal   Collection Time: 07/31/19  2:56 AM  Result Value Ref Range   Glucose-Capillary 218 (H) 70 - 99 mg/dL    Comment: Glucose reference range applies only to samples taken after fasting for at least 8 hours.  CBC     Status: Abnormal   Collection Time: 07/31/19  4:47 AM  Result Value Ref Range   WBC 8.0 4.0 - 10.5 K/uL   RBC 4.11 3.87 - 5.11 MIL/uL   Hemoglobin 12.9 12.0 - 15.0 g/dL   HCT 37.7 36 - 46 %   MCV 91.7 80.0 - 100.0 fL   MCH 31.4 26.0 - 34.0 pg   MCHC 34.2 30.0 - 36.0 g/dL   RDW 11.9 11.5 - 15.5 %   Platelets 519 (H) 150 - 400 K/uL   nRBC 0.0 0.0 - 0.2 %    Comment: Performed at [facility] [address], [place], [place] [address]  Comprehensive metabolic  panel     Status: Abnormal   Collection Time: 07/31/19  4:47 AM  Result Value Ref Range   Sodium 133 (L) 135 - 145 mmol/L   Potassium 4.6 3.5 - 5.1 mmol/L   Chloride 104 98 - 111 mmol/L   CO2 17 (L) 22 - 32 mmol/L   Glucose, Bld 217 (H) 70 - 99 mg/dL    Comment: Glucose reference range applies only to samples taken after fasting for at least 8 hours.   BUN 27 (H) 6 - 20 mg/dL    Creatinine, Ser 0.68 0.44 - 1.00 mg/dL   Calcium 9.2 8.9 - 10.3 mg/dL   Total Protein 7.1 6.5 - 8.1 g/dL   Albumin 3.0 (L) 3.5 - 5.0 g/dL   AST 26 15 - 41 U/L   ALT 21 0 - 44 U/L   Alkaline Phosphatase 63 38 - 126 U/L   Total Bilirubin 0.7 0.3 - 1.2 mg/dL   GFR calc non Af Amer >60 >60 mL/min   GFR calc Af Amer >60 >60 mL/min   Anion gap 12 5 - 15    Comment: Performed at [facility], [place] [address] [place], [place] [address]  Glucose, capillary     Status: Abnormal   Collection Time: 07/31/19  5:08 AM  Result Value Ref Range   Glucose-Capillary 205 (H) 70 - 99 mg/dL    Comment: Glucose reference range applies only to samples taken after fasting for at least 8 hours.  Glucose, capillary     Status: Abnormal   Collection Time: 07/31/19  6:25 AM  Result Value Ref Range   Glucose-Capillary 177 (H) 70 - 99 mg/dL    Comment: Glucose reference range applies only to samples taken after fasting for at least 8 hours.  Glucose, capillary     Status: Abnormal   Collection Time: 07/31/19  6:51 AM  Result Value Ref Range   Glucose-Capillary 160 (H) 70 - 99 mg/dL    Comment: Glucose reference range applies only to samples taken after fasting for at least 8 hours.  Glucose, capillary     Status: Abnormal   Collection Time: 07/31/19  8:03 AM  Result Value Ref Range   Glucose-Capillary 284 (H) 70 - 99 mg/dL    Comment: Glucose reference range applies only to samples taken after fasting for at least 8 hours.  Glucose, capillary     Status: Abnormal   Collection Time: 07/31/19  9:14 AM  Result Value Ref Range   Glucose-Capillary 305 (H) 70 - 99 mg/dL    Comment: Glucose reference range applies only to samples taken after fasting for at least 8 hours.  Glucose, capillary     Status: Abnormal   Collection Time: 07/31/19 10:09 AM  Result Value Ref Range   Glucose-Capillary 397 (H) 70 - 99 mg/dL    Comment: Glucose reference range applies only to samples taken after fasting for at least 8  hours.    Current Facility-Administered Medications  Medication Dose Route Frequency Provider Last Rate Last Admin  . acetaminophen (TYLENOL) tablet 325-650 mg  325-650 mg Oral Q4H PRN Bary Leriche, PA-C   650 mg at 07/29/19 1147  . ALPRAZolam Duanne Moron) tablet 0.25 mg  0.25 mg Oral TID PRN [name], MD   0.25 mg at 07/31/19 0940  . alum & mag hydroxide-simeth (MAALOX/MYLANTA) 200-200-20 MG/5ML suspension 30 mL  30 mL Oral Q4H PRN Love, Pamela S, PA-C      . aspirin chewable  tablet 81 mg  81 mg Oral Daily Bary Leriche, PA-C   81 mg at 07/31/19 0940  . atorvastatin (LIPITOR) tablet 80 mg  80 mg Oral Daily Bary Leriche, PA-C   80 mg at 07/31/19 5102  . benazepril (LOTENSIN) tablet 5 mg  5 mg Oral Daily Kirsteins, Luanna Salk, MD   5 mg at 07/31/19 0940  . bisacodyl (DULCOLAX) suppository 10 mg  10 mg Rectal Daily PRN Love, Pamela S, PA-C      . buPROPion (WELLBUTRIN XL) 24 hr tablet 300 mg  300 mg Oral Daily Bary Leriche, PA-C   300 mg at 07/31/19 0958  . carbamide peroxide (DEBROX) 6.5 % OTIC (EAR) solution 5 drop  5 drop Right EAR BID Kirsteins, Luanna Salk, MD   5 drop at 07/31/19 0943  . carvedilol (COREG) tablet 3.125 mg  3.125 mg Oral BID WC Kirsteins, Luanna Salk, MD   3.125 mg at 07/31/19 0940  . cyclobenzaprine (FLEXERIL) tablet 5 mg  5 mg Oral TID PRN Bary Leriche, PA-C   5 mg at 07/25/19 0108  . diphenhydrAMINE (BENADRYL) 12.5 MG/5ML elixir 12.5-25 mg  12.5-25 mg Oral Q6H PRN Love, Pamela S, PA-C      . enoxaparin (LOVENOX) injection 40 mg  40 mg Subcutaneous QHS Love, Pamela S, PA-C   40 mg at 07/30/19 2313  . feeding supplement (GLUCERNA SHAKE) (GLUCERNA SHAKE) liquid 237 mL  237 mL Oral TID BM Kirsteins, Luanna Salk, MD   237 mL at 07/30/19 2316  . folic acid (FOLVITE) tablet 1 mg  1 mg Oral Daily Love, Ivan Anchors, PA-C   1 mg at 07/31/19 0941  . gabapentin (NEURONTIN) capsule 300 mg  300 mg Oral QHS [name], MD   300 mg at 07/30/19 2301  . glucose 4 GM chewable tablet            . guaiFENesin-dextromethorphan (ROBITUSSIN DM) 100-10 MG/5ML syrup 5-10 mL  5-10 mL Oral Q6H PRN Love, Ivan Anchors, PA-C      . HYDROcodone-acetaminophen (NORCO/VICODIN) 5-325 MG per tablet 1-2 tablet  1-2 tablet Oral Q4H PRN Bary Leriche, PA-C   1 tablet at 07/30/19 1617  . hydroxychloroquine (PLAQUENIL) tablet 200 mg  200 mg Oral Daily Bary Leriche, PA-C   200 mg at 07/31/19 0957  . hydrOXYzine (ATARAX/VISTARIL) tablet 10 mg  10 mg Oral BID PRN Love, Pamela S, PA-C      . insulin aspart protamine- aspart (NOVOLOG MIX 70/30) injection 28 Units  28 Units Subcutaneous BID WC [name], MD   28 Units at 07/31/19 1002  . meclizine (ANTIVERT) tablet 6.25 mg  6.25 mg Oral TID Bary Leriche, PA-C   6.25 mg at 07/31/19 0936  . metFORMIN (GLUCOPHAGE) tablet 500 mg  500 mg Oral Q breakfast Kirsteins, Luanna Salk, MD   500 mg at 07/31/19 0941  . mirtazapine (REMERON) tablet 15 mg  15 mg Oral QHS Charlett Blake, MD   15 mg at 07/30/19 2301  . multivitamin with minerals tablet 1 tablet  1 tablet Oral Daily Bary Leriche, PA-C   1 tablet at 07/31/19 5852  . Muscle Rub CREA   Topical BID PRN Kirsteins, Luanna Salk, MD      . nicotine polacrilex (NICORETTE) gum 2 mg  2 mg Oral PRN Bary Leriche, PA-C   2 mg at 07/20/19 1431  . polyethylene glycol (MIRALAX / GLYCOLAX) packet 17 g  17 g  Oral Daily PRN Love, Ivan Anchors, PA-C      . polyethylene glycol (MIRALAX / GLYCOLAX) packet 17 g  17 g Oral QODAY LoveIvan Anchors, PA-C   17 g at 07/28/19 1030  . prochlorperazine (COMPAZINE) tablet 5-10 mg  5-10 mg Oral Q6H PRN Love, Pamela S, PA-C       Or  . prochlorperazine (COMPAZINE) injection 5-10 mg  5-10 mg Intramuscular Q6H PRN Bary Leriche, PA-C   10 mg at 07/21/19 2307   Or  . prochlorperazine (COMPAZINE) suppository 12.5 mg  12.5 mg Rectal Q6H PRN Love, Pamela S, PA-C      . rOPINIRole (REQUIP) tablet 0.5 mg  0.5 mg Oral QHS Charlett Blake, MD   0.5 mg at 07/30/19 2301  . sodium phosphate  (FLEET) 7-19 GM/118ML enema 1 enema  1 enema Rectal Once PRN Love, Pamela S, PA-C      . topiramate (TOPAMAX) tablet 50 mg  50 mg Oral BID Charlett Blake, MD   50 mg at 07/31/19 0941    Musculoskeletal: Strength & Muscle Tone: decreased Gait & Station: Did not witness Patient leans: N/A  Psychiatric Specialty Exam: Physical Exam  Nursing note and vitals reviewed. Constitutional: She is oriented to person, place, and time. She is cooperative.  HENT:  Head: Normocephalic.  Respiratory: Effort normal.  GI: Normal appearance.  Neurological: She is alert and oriented to person, place, and time.  Skin: She is not diaphoretic.  Psychiatric: Her speech is normal and behavior is normal. Judgment and thought content normal. Her mood appears anxious. Cognition and memory are impaired. She exhibits a depressed mood.    Review of Systems  Psychiatric/Behavioral: Positive for dysphoric mood. The patient is nervous/anxious.   All other systems reviewed and are negative.   Blood pressure 122/88, pulse (!) 104, temperature 98.3 F (36.8 C), temperature source Oral, resp. rate 16, SpO2 100 %.There is no height or weight on file to calculate BMI.  General Appearance: Casual  Eye Contact:  Good  Speech:  Normal Rate  Volume:  Normal  Mood:  Anxious and Depressed  Affect:  Congruent  Thought Process:  Coherent and Descriptions of Associations: Intact  Orientation:  Full (Time, Place, and Person)  Thought Content:  Rumination  Suicidal Thoughts:  No  Homicidal Thoughts:  No  Memory:  Immediate;   Fair Recent;   Fair Remote;   Fair  Judgement:  Fair  Insight:  Fair  Psychomotor Activity:  Normal  Concentration:  Concentration: Fair and Attention Span: Fair  Recall:  [employer] of Knowledge:  Fair  Language:  Good  Akathisia:  No  Handed:  Right  AIMS (if indicated):     Assets:  Leisure Time Resilience Social Support  ADL's:  Impaired  Cognition:  Impaired,  Mild  Sleep:         Treatment Plan Summary: Major depressive disorder, recurrent, moderate: -Recommend decreasing Wellbutrin 300 mg daily to 150 mg daily then tapering to 75 mg daily in 3 days and then discontinuing the Wellbutrin. -Recommend starting Celexa 10 mg daily and in 3 days increasing to Celexa 20 mg daily -Recommend discontinuing gabapentin 300 mg at bedtime -Recommend starting Depakote 250 mg twice daily to assist with mood and emotional regulation  Anxiety: -Recommend discontinuing Xanax 0.25 mg 3 times daily as needed -Recommend starting hydroxyzine 10 mg 3 times daily as needed  Insomnia: -Continue Remeron 15 mg at bedtime  Disposition: No evidence of imminent risk  to self or others at present.   Patient does not meet criteria for psychiatric inpatient admission. Supportive therapy provided about ongoing stressors.  Waylan Boga, NP 07/31/2019 10:40 AM

## 2019-07-31 NOTE — Progress Notes (Signed)
Patient ID: Brittany [name], female   DOB: [birth year], 59 y.o.   MRN: [id number]   Sw contacted daughter to schedule family education for Saturday. Daughter has to find babysitter, she will update SW by 1PM.

## 2019-07-31 NOTE — Progress Notes (Signed)
Patient ID: Brittany [name], female   DOB: [birth year], 60 y.o.   MRN: [id number]   Family education scheduled tomorrow 11-2

## 2019-07-31 NOTE — Progress Notes (Signed)
On call provider was called to give update on patient blood sugar level of 59 after having had a meal. Verbal orders were taken & entered. Orders verbalized to patients nurse. Will continue to monitor

## 2019-07-31 NOTE — Progress Notes (Signed)
CBG collected at 0508: 205. Pt is A&Ox4, VS WNL, pt denies pain. No signs of distress noted. Pt had 3rd lg incontinent BM. Pt resting at this time. Call light in reach.

## 2019-07-31 NOTE — Progress Notes (Signed)
   07/31/19 1300  Clinical Encounter Type  Visited With Patient  Visit Type Initial  Spiritual Encounters  Spiritual Needs Emotional;Grief support  Stress Factors  Patient Stress Factors Exhausted;Family relationships   Chaplain engaged in initial visit with Brittany [name].  Brittany [name] throughout visit was emotional and expressed being "heartbroken" over her family, specifically her mother and daughter. She says that she is hurt because they do not care about her.  [name] also revealed that family seems to pull on her a lot to take care of them. [name] stated that they assume because she was left a house and more from her husband's passing, they always ask her for things.  [name] seemed to be vocalizing that she is always there for people but when she needs others to show up for her, they do not.  Chaplain also assessed that [name] has had little time to grieve her husband's passing, which was a couple of years ago, because she always seems to be taking care of others.    [name] also noted that her family has talked about her and lied on her.  She stated that she wishes she could move to the other side of the world.  Throughout the visit, [name] stated several times that she was just tired. She declared that she wants to  be loved too.    [name] voiced that her mother and daughter are the reasons why she is in the hospital.  They seem to have created a great deal of stress for her and have not given her the space to take care of herself.    Chaplain assesses that [name] is afraid of being alone and having no one to depend on but recognizes that she cannot depend on her nuclear family to help her.  She desperately wants for her mother and daughter to listen to her. The actions of those she loves most seem to be contrary to how she feels about them. She seems to be reaching out for a community that does not reciprocate her love and care.  She stated, "Who will wipe my tears outside of here?" She also seems to be presently  declaring that she is at a place of wanting to take care of herself but right now she is covered in hurt and bitterness.  [name] proclaimed several times, "They don't care, so I don't care." Melena also voiced that she no longer wants her daughter to live in her house if she is unable to follow certain rules, but she does want her granddaughter to stay.  She also no longer wants to associate with her mother and sister.    Chaplain affirmed Kemper being in a place of being tired and needing to take care of herself by creating healthy boundaries. Chaplain affirmed that it is not selfish to take care of yourself. Chaplain offered prayer over [place].  Brittany [name] thanked chaplain for coming to see her and voiced, "you needed to see me today." Chaplain will follow-up before [place] discharges next week.
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
## 2019-07-31 NOTE — Progress Notes (Signed)
Patient ID: Brittany [name], female   DOB: Jun 11, 1960, 59 y.o.   MRN: [id number]   [name] ordered

## 2019-07-31 NOTE — Progress Notes (Signed)
Verbal order was given by on call provider to continue the D5 1/2 NS at 0700 at 21ml/hr until evaluation by provider. Report given to on coming nurse.

## 2019-07-31 NOTE — Progress Notes (Signed)
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  Afdmitted in DKA.  Poor intake yesterday , received juice yest pm with elevation of CBG, per SSI received 9 U Novalog in evening had hypoglycemic episode.  ROS: Patient denies N/V/D , no CP or SOB    Objective:   No results found. Recent Labs    07/31/19 0447  WBC 8.0  HGB 12.9  HCT 37.7  PLT 519*   Recent Labs    07/30/19 2147 07/31/19 0447  NA  --  133*  K  --  4.6  CL  --  104  CO2  --  17*  GLUCOSE 132* 217*  BUN  --  27*  CREATININE  --  0.68  CALCIUM  --  9.2    Intake/Output Summary (Last 24 hours) at 07/31/2019 0854 Last data filed at 07/31/2019 0809 Gross per 24 hour  Intake 240 ml  Output --  Net 240 ml     Physical Exam: Vital Signs Blood pressure 117/76, pulse 96, temperature 98.3 F (36.8 C), resp. rate 20, SpO2 96 %. General: No acute distress Mood and affect are appropriate Heart: Regular rate and rhythm no rubs murmurs or extra sounds Lungs: Clear to auscultation, breathing unlabored, no rales or wheezes Abdomen: Positive bowel sounds, soft nontender to palpation, nondistended Extremities: No clubbing, cyanosis, or edema Skin: No evidence of breakdown, no evidence of rash  Neurologic: Cranial nerves II through XII intact, motor strength is 5/5 in bilateral deltoid, bicep, tricep, grip, hip flexor, knee extensors, ankle dorsiflexor and plantar flexor Sensation intact to LT both feet.  Pedal pulses are normal  Musculoskeletal: Full range of motion in all 4 extremities. No joint swelling    Assessment/Plan: 1. 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  Physiatrist is providing close team supervision and 24 hour  management of active medical problems listed below.  Physiatrist and rehab team continue to assess barriers to discharge/monitor patient progress toward functional and medical goals  Care Tool:  Bathing    Body parts bathed by patient: Right arm, Abdomen, Chest, Left arm, Front perineal area, Left upper leg, Face, Right upper leg, Right lower leg, Left lower leg, Buttocks   Body parts bathed by helper: Buttocks     Bathing assist Assist Level: Supervision/Verbal cueing     Upper Body Dressing/Undressing Upper body dressing   What is the patient wearing?: Pull over shirt, Bra    Upper body assist Assist Level: Minimal Assistance - Patient > 75%    Lower Body Dressing/Undressing Lower body dressing      What is the patient wearing?: Pants, Underwear/pull up     Lower body assist Assist for lower body dressing: Contact Guard/Touching assist     Toileting Toileting    Toileting assist Assist for toileting: Supervision/Verbal cueing     Transfers Chair/bed transfer  Transfers assist     Chair/bed transfer assist level: Supervision/Verbal cueing     Locomotion Ambulation   Ambulation assist      Assist level: Moderate Assistance - Patient 50 - 74% Assistive device: Hand held assist Max distance: 10   Walk 10 feet activity   Assist     Assist level: Moderate Assistance - Patient - 50 - 74% Assistive  device: Hand held assist   Walk 50 feet activity   Assist Walk 50 feet with 2 turns activity did not occur: Safety/medical concerns         Walk 150 feet activity   Assist Walk 150 feet activity did not occur: Safety/medical concerns         Walk 10 feet on uneven surface  activity   Assist     Assist level: Moderate Assistance - Patient - 50 - 74% Assistive device: Aeronautical engineer Will patient use wheelchair at discharge?: No             Wheelchair 50 feet with 2 turns activity    Assist             Wheelchair 150 feet activity     Assist          Blood pressure 117/76, pulse 96, temperature 98.3 F (36.8 C), resp. rate 20, SpO2 96 %.  Medical Problem List and Plan: 1.Functional and mobility deficitssecondary to left cerebellar infarct- minimal stroke deficits CIR PT, OT, SLP  -ELOS/Goals: tent D/C 6/22,  2. Antithrombotics: -DVT/anticoagulation:Pharmaceutical:Lovenox -antiplatelet therapy: On low dose ASA 3.C6/C7 radiculopathy/Pain Management:s/p ESI- 3/23.Pt with notable multifactorial spondylosis in mid to lower cervical spine -add heating pad.  -utilize muscle relaxant, Muscle balm - therapy addressing posture, ROM -Willalsocontinue Hydrocodone prn.  On high dose gabpentin at night for presumed neuropathic pain but no clinical signs of a severe neuropathy this was stopped.    -continue Topirimate for HA  4. Mood:LCSW to follow for evaluation and support.- hx depression with lability ask psych to eval - pt ok with this  -antipsychotic agents: N/A  -anxiety is a barrier to participation   -add low dose xanax prn in addition to atarax   -request neuropsych eval this week Emotional lability- states she was seen by [facility] psych (did not help) could not give details on Psych diagnosis, ? Need for mood stabilizing medication  5. Neuropsych: This patientiscapable of making decisions on herown behalf. 6. Skin/Wound Care:Routine pressure relief measures. 7. Fluids/Electrolytes/Nutrition:Monitor I/O. Check lytes in am. Low BMI with moderate protein malnutrition  Seen by dietician as OP , poor compliance 8. HTN: Monitor BP tid--on Lisinoprilwith BP still poorly controlled. SBP >100's. Will likely need titration of meds. Vitals:   07/31/19 0126 07/31/19 0510  BP: 108/71 117/76  Pulse: 75 96  Resp: 20 20  Temp: (!) 97.5 F (36.4 C) 98.3 F (36.8 C)   SpO2:  96%  controlled on Lotensin 6/15, will reduce and start low dose coreg for tachy 9. T2DM post pancreatectomy: Poorly controlled with Hgb A1C- 12.5. Monitor BS ac/hs--poor controlled. Resume 70/30 insulin. Was not usingmetforminPTA.Will d/c Glucerna between meals as likely affecting BS and change to Ensure Max which has 5g net carbs.  CBG (last 3)  Recent Labs    07/31/19 0625 07/31/19 0651 07/31/19 0803  GLUCAP 177* 160* 284*  Adjusted 70/30 dose  -6/18 controlled this am will d/c SSI given the labile CBGs, encourage pt to eat at least 50% meals 10 Collagen Vascular disease/discoid Lupus: Resume Plaquenil and folic acid.  11. Pancreatic insufficiency/S/p Pancreatectomy: Resume pancrease tid ac. Will resume psyllium and loperemide prn for chronic diarrhea.  12. Dyslipidemia: On high dose statin.  13. Dizziness:Continue low dose antivert. May need vestibular evaluation. 88. H/o Major depressive disorder: Continue Wellbutrin--resume atarax bidprn(for anxiety). Resumed  Remeron.  -add prn xanax as above 15.Tobacco abuse: Willing to try  nicotine gum--will order. 16. Vitamin B 12 deficiency: Gets IM injection every month.  17.  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  Insomnia multifactorial-added ropirinole for restless legs  LOS: 11 days A FACE TO FACE EVALUATION WAS PERFORMED  Charlett Blake 07/31/2019, 8:54 AM

## 2019-07-31 NOTE — Progress Notes (Signed)
CBG 225, Pt A&O x4, Pt had incontinent large BM. C/o being cold and headache. Denies having any other pain. Peri care completed. All VS WNL. Fluids started per order. Pt resting in bed at this time, call light in reach
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
## 2019-07-31 NOTE — Progress Notes (Signed)
Late entry: pt alert with confusion upon receiving pt this am, sitting up 80 degrees with breakfast tray in front of her upon receiving pt this am. Pt upset/agitaed about breakfast. Pt very tearful on next encounter, provided PRN medications with good results, continued to monitor CBG's every hour and check vs when needed.  After lunch blood sugar seemed to drop after pt had eaten food from outside and having a milkshake, discussed with PA, will continue CBGs as pt understands and doesn't mind them being checked at this point. At dinner pt did not have tray, encouraged her to order and also ordered extra food for snack later if needed for night shift.

## 2019-07-31 NOTE — Progress Notes (Addendum)
Occupational Therapy Session Note  Patient Details  Name: Brittany [name] MRN: [id number] Date of Birth: [birth year]  Today's Date: 07/31/2019 OT Individual Time: 5361-4431 OT Individual Time Calculation (min): 56 min   Short Term Goals: Week 2:  OT Short Term Goal 1 (Week 2): STG=LTGs secondary to short LOS    Skilled Therapeutic Interventions/Progress Updates:    Pt greeted in bed, teary regarding hypoglycemic event last night. Feeling fine at present, agreeable to engage in self care tasks during session. OT set her up with bathing tasks EOB as she was hooked up to IV. After pt selected her clothing items from duffel bag, she doffed underwear and gown while standing with supervision for balance using RW. When she sat back down, pt reported urgently needing to void B+B. OT set up [facility] and pt completed transfer with CGA and no AD. After pt had small BM, she was adamant that she wanted to continue voiding on the toilet. Supervision for ambulatory transfer to toilet using device where pt continued voiding B+B. She completed hygiene while sitting with increased time. Pt adamant that she wanted to complete bathing tasks while standing at the sink next and was very specific about setup. She completed UB bathing and pericare with increased time and supervision for standing balance. After washing, she sat down in the w/c and completed UB/LB dressing sit<stand using RW with supervision assist and vcs for sustained attention to task due to tangential tendencies and emotional lability. At end of session pt was left in care of NT, brushing her teeth while standing at the sink.    Therapy Documentation Precautions:  Precautions Precautions: Fall Restrictions Weight Bearing Restrictions: No Vital Signs: Therapy Vitals Temp: 98.3 F (36.8 C) Temp Source: Oral Pulse Rate: (!) 104 Resp: 16 BP: 122/88 Patient Position (if appropriate): Sitting Oxygen Therapy SpO2: 100 % O2 Device: Room Air Pain: Pt  reported improved HA discomfort since last session with this therapist, about to take pain medicine from RN before OT left   ADL: ADL Eating: Set up Where Assessed-Eating: Chair Grooming: Supervision/safety Where Assessed-Grooming: Sitting at sink Upper Body Bathing: Contact guard Lower Body Bathing: Moderate assistance Upper Body Dressing: Contact guard Lower Body Dressing: Moderate assistance Toileting: Moderate assistance Toilet Transfer: Moderate assistance (Min-Mod) Toilet Transfer Method: Stand pivot Tub/Shower Transfer: Moderate assistance (hand held assist)      Therapy/Group: Individual Therapy  Evyn Kooyman A Avriana Joo 07/31/2019, 12:20 PM

## 2019-07-31 NOTE — Progress Notes (Signed)
Nutrition Follow-up  RD working remotely.  DOCUMENTATION CODES:   Non-severe (moderate) malnutrition in context of chronic illness  INTERVENTION:   - Continue MVI with minerals daily  -Continue Glucerna Shake po TID, each supplement provides 220 kcal and 10 grams of protein  - Encourage adequate PO intake  - Provided DM diet education materials and will attempt to follow-up in-person next week; briefly discussed diet education via phone call  NUTRITION DIAGNOSIS:   Moderate Malnutrition related to chronic illness (stroke, poorly managed T2DM) as evidenced by mild fat depletion, moderate fat depletion, mild muscle depletion, moderate muscle depletion, percent weight loss (13.1% weight loss in less than 5 months).  Ongoing  GOAL:   Patient will meet greater than or equal to 90% of their needs  Progressing  MONITOR:   PO intake, Supplement acceptance, Labs, Weight trends  REASON FOR ASSESSMENT:   Consult Diet education  ASSESSMENT:   59 year old female with PMH of T2DM, depression, discoid Lupus, tobacco use. Admitted on 07/12/19 with 3 day history of N/V and dizziness. Pt was found to have DKA. 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 Admitted to CIR on 6/07.  Target discharge date of 08/04/19.  RD attempted to speak with pt via phone call to room. Multiple attempts were made in the morning, but line was busy on all tries. RD did make an additional attempt this afternoon and was able to reach pt.  Pt reports that her appetite is good and that she is eating lunch at this time. RD provided brief diet education via phone call as pt was difficult to engage due to being distracted with lunch. Pt reports that she does not have any questions at this time about diet for DM management.  RD has attached DM diet education materials to pt's AVS/Discharge  Instructions. Will attempt to follow-up in person with pt next week as schedule allows to reinforce education.  Pt did receive education in-person by RD on 07/14/19.  Pt accepting most Glucerna Shakes per [facility] documentation. Will continue with current supplement regimen.  Meal Completion: 10-100% x last 8 recorded meals (averaging 58%)  Medications reviewed and include: Glucerna Shake TID, folic acid, Novolog 93/81 28 units BID, Metformin, Remeron, MVI with minerals, miralax  Labs reviewed. CBG's: 160-452 x 24 hours  Diet Order:   Diet Order            Diet regular Room service appropriate? Yes; Fluid consistency: Thin  Diet effective now                 EDUCATION NEEDS:   Education needs have been addressed  Skin:  Skin Assessment: Reviewed RN Assessment  Last BM:  07/31/19  Height:   Ht Readings from Last 1 Encounters:  07/13/19 5' (1.524 m)    Weight:   Wt Readings from Last 1 Encounters:  07/13/19 47.3 kg    Ideal Body Weight:  45.5 kg  BMI:  There is no height or weight on file to calculate BMI.  Estimated Nutritional Needs:   Kcal:  1500-1700  Protein:  75-90 grams  Fluid:  >/= 1.5 L    Gaynell Face, MS, RD, LDN Inpatient Clinical Dietitian Pager: [phone] Weekend/After Hours: [phone]

## 2019-07-31 NOTE — Progress Notes (Signed)
Pt CBG 59. Pt c/o of being cold and sleepy. Lysle Morales RN, and Danella Sensing, on call Provider notified. New orders placed and implemented.

## 2019-07-31 NOTE — Progress Notes (Signed)
Occupational Therapy Session Note  Patient Details  Name: Brittany [name] MRN: [id number] Date of Birth: [birth year]  Today's Date: 07/31/2019 OT Individual Time: 4431-5400 OT Individual Time Calculation (min): 43 min    Short Term Goals: Week 2:  OT Short Term Goal 1 (Week 2): STG=LTGs secondary to short LOS  Skilled Therapeutic Interventions/Progress Updates:    Pt in wheelchair to begin session, attempting to call her family member to bring her something to eat.  Pt quickly growing more frustrated as her phone was not working properly for her to contact them.  While she was attempting to make the call therapist asked her about her CVA and deficits that she notices now compared to before the stroke.  Pt stated "I almost died last night", referring to her blood sugar dropping dramatically.  When asked repeatedly, she continued making the same statement and was unable to give specific examples of things that are different now such as her balance or coordination, until therapist sited these specific deficits.  She then stated "yes I have some trouble with those".  Therapist tried to assist with phone use, as pt's frustration escalated, but it still would not work.  He then contacted nursing who allowed pt to use a phone to call her family member.  She was then able to be re-directed back to the session.  Had pt ambulate down to the dayroom with hand held min assist.  She then focused on standing balance task of alternated stepping onto a 6" step with min assist for balance.  Noted some trunk and LE ataxia when attempting to step up with the LUE and maintain while demonstrating increased weightbearing through the RLE.  She completed several attempts of this with rest breaks in between.  She ambulated back to the room at min assist level again to complete session with call button in reach and safety alarm pad in place.      Therapy Documentation Precautions:  Precautions Precautions:  Fall Restrictions Weight Bearing Restrictions: No  Pain: Pain Assessment Pain Scale: Faces Pain Score: 0-No pain ADL: See Care Tool Section for some details of mobility and selfcare  Therapy/Group: Individual Therapy  Brigett Estell OTR/L 07/31/2019, 5:14 PM

## 2019-07-31 NOTE — Progress Notes (Signed)
Physical Therapy Session Note  Patient Details  Name: Brittany [name] MRN: [id number] Date of Birth: September 08, 1960  Today's Date: 07/31/2019 PT Individual Time: 0800-0827 PT Individual Time Calculation (min): 27 min   Short Term Goals: Week 2:  PT Short Term Goal 1 (Week 2): Pt will ambulate with supervision assist consistently 39f and LRAD PT Short Term Goal 2 (Week 2): Pt will perform bed mobility with supervision A and no use of bed features. PT Short Term Goal 3 (Week 2): Pt will transfer to and from bed with supervision assist and LRAD  Skilled Therapeutic Interventions/Progress Updates:   Pt received supine in bed and agreeable to PT, initially with no pain at present. Pt then reports that she has not received breakfast this AM, but food on breakfast tray had been 75% consumed. Pt became verbally frustrated accusing hospital of starving patients. PT educated pt on the possibility of human error entering breakfast selection, but pt becoming increasingly frustrated. Nutrition service rep then present bringing additional food. Pt momentarily calmed down and agreed to eat at EOB. Supine>sit with supervision assist. Once sitting up, pt became extremely tearful stating that she had almost died last night. PT assessed Vital signs. BP 120/90, HR 105, SpO2 100%. Pt returned to supine in bed without assist. Therapeutic use of self to calm patient down and education of importance of blood sugar, hydration, and engagement in physical activity with therapy to maximize success and prepare for d/c. Pt verbalized understanding, and requested to finish breakfast prior to additional therapy. Pt left in bed with call bell in reach and all needs met.       Therapy Documentation Precautions:  Precautions Precautions: Fall Restrictions Weight Bearing Restrictions: No General: PT Amount of Missed Time (min): 32 Minutes PT Missed Treatment Reason: Increased agitation;Patient fatigue;Patient unwilling to  participate Pain:   denies    Therapy/Group: Individual Therapy  ALorie Phenix6/18/2021, 4:36 PM

## 2019-07-31 NOTE — Progress Notes (Signed)
Received a call from Gisela at 21:30  Reporting Brittany [name]  was found not responding, blood sugar was noted 11, she reports. Lysle Morales RN had called Rapid Response Nurse, IV access was obtained and she was given D50. Order was given to obtain serum glucose and check blood sugars every hour. Serum Glucose was 132 at 23:25 and fingerstick dropped to 97 at 12:00 midnight. Placed a call to Dr. Ranell Patrick for feed back with  IVF D5w  Due to Left Cerebral Infarct, awaiting return call.  Placed a call to [facility] ( Dr. Myna Hidalgo) and discussed the above and Hypothermia. He states I could order D51/2 NS @ 50 hour, order was given to [facility]. We will continue to Check Blood sugar every 1- 2 hours over night and continue IVF till 7 am. Dr. Letta Pate can re-assess in the morning. She verbalizes understanding.

## 2019-07-31 NOTE — Progress Notes (Signed)
On call provider was recalled to inform of hypothermia & latest blood sugar reading of 97. Staff could not receive an accurate reading orally & rectal temperature was taken. No new orders at this time. Next blood sugar check to continue. Staff put in place other interventions to warm up patient.

## 2019-08-01 LAB — GLUCOSE, CAPILLARY
Glucose-Capillary: 158 mg/dL — ABNORMAL HIGH (ref 70–99)
Glucose-Capillary: 177 mg/dL — ABNORMAL HIGH (ref 70–99)
Glucose-Capillary: 183 mg/dL — ABNORMAL HIGH (ref 70–99)
Glucose-Capillary: 204 mg/dL — ABNORMAL HIGH (ref 70–99)
Glucose-Capillary: 232 mg/dL — ABNORMAL HIGH (ref 70–99)
Glucose-Capillary: 236 mg/dL — ABNORMAL HIGH (ref 70–99)
Glucose-Capillary: 236 mg/dL — ABNORMAL HIGH (ref 70–99)
Glucose-Capillary: 252 mg/dL — ABNORMAL HIGH (ref 70–99)
Glucose-Capillary: 256 mg/dL — ABNORMAL HIGH (ref 70–99)
Glucose-Capillary: 262 mg/dL — ABNORMAL HIGH (ref 70–99)
Glucose-Capillary: 273 mg/dL — ABNORMAL HIGH (ref 70–99)
Glucose-Capillary: 287 mg/dL — ABNORMAL HIGH (ref 70–99)
Glucose-Capillary: 32 mg/dL — CL (ref 70–99)
Glucose-Capillary: 47 mg/dL — ABNORMAL LOW (ref 70–99)
Glucose-Capillary: 50 mg/dL — ABNORMAL LOW (ref 70–99)
Glucose-Capillary: 68 mg/dL — ABNORMAL LOW (ref 70–99)
Glucose-Capillary: 73 mg/dL (ref 70–99)
Glucose-Capillary: 80 mg/dL (ref 70–99)

## 2019-08-01 MED ORDER — GLUCOSE 4 G PO CHEW
CHEWABLE_TABLET | ORAL | Status: AC
  Filled 2019-08-01: qty 1

## 2019-08-01 MED ORDER — ROPINIROLE HCL 1 MG PO TABS
1.0000 mg | ORAL_TABLET | Freq: Two times a day (BID) | ORAL | Status: DC
  Administered 2019-08-01 – 2019-08-04 (×6): 1 mg via ORAL
  Filled 2019-08-01 (×6): qty 1

## 2019-08-01 NOTE — Progress Notes (Signed)
Hypoglycemic Event  CBG: 47  Treatment: sandwich, coke  Symptoms: asymptomatic  Follow-up CBG: Time: 0103 CBG Result: 50 Follow-up CBG: Time: 0118   CBG Result: 32  Possible Reasons for Event:   Comments/MD notified: followed protocol    Hypoglycemic Event  CBG: 32 Treatment: glucose tablets  Symptoms: asymtomatic  Follow-up CBG: Time: 0136 CBG Result: 73  Possible Reasons for Event:   Comments/MD notified: followed protocol    Chilton Si

## 2019-08-01 NOTE — Progress Notes (Signed)
Hypoglycemic Event  CBG: 68  Treatment: banana  muffin/OJ  Symptoms: none  Follow-up CBG: Time:1537 CBG Result:80  Possible Reasons for Event: unknown  Comments/MD notified:followed protocol    [name]

## 2019-08-01 NOTE — Progress Notes (Signed)
Physical Therapy Session Note  Patient Details  Name: Brittany [name] MRN: [id number] Date of Birth: 14-Jun-1960  Today's Date: 08/01/2019 PT Individual Time: 5027-1423 PT Individual Time Calculation (min): 55 min   Short Term Goals: Week 2:  PT Short Term Goal 1 (Week 2): Pt will ambulate with supervision assist consistently 44f and LRAD PT Short Term Goal 2 (Week 2): Pt will perform bed mobility with supervision A and no use of bed features. PT Short Term Goal 3 (Week 2): Pt will transfer to and from bed with supervision assist and LRAD Week 3:     Skilled Therapeutic Interventions/Progress Updates:   Pt received ambulating with guest through hall. Daughter present for family education. Gait training throughout unit with RW [id number] supervision assist. Stair management training with 1 UE support 2 x 4; supervision assist provided by PT then daughter. Pt also performed ascent/descent of 2 steps with RW on the top of second step with CGA from PT for safety then performed with daughter providing min assist. Car transfer training with supervision assist and RW to small SUV height and cues for sit>pivot technique. Pt's daughter concerned with shower transfer. Instructed use of RW to back into shower due to small size of bathroom and shower, then sit>pivot on shower chair. Recommended use of suction rod for safety in standing once in shower. Patient returned to room and [facility] and all needs met.         Therapy Documentation Precautions:  Precautions Precautions: Fall Restrictions Weight Bearing Restrictions: No    Pain: denies   Therapy/Group: Individual Therapy  AustiLorie Phenix/2021, 3:55 PM

## 2019-08-01 NOTE — Progress Notes (Signed)
[place] PHYSICAL MEDICINE & REHABILITATION PROGRESS NOTE   Subjective/Complaints: Has been receiving q1H CBGs due to brittle diabetes. Sugars maintained in 180s to 200s. Changed to [facility] as patient complaining of no rest between sticks. CBG did drop to 68 and she received muffin and orange juice and CBG increased to 80. Continued to increase to 176 and did not cover with 70/30 given prior severe hypoglycemic event which required rapid response.   ROS: Patient denies N/V/D , no CP or SOB    Objective:   No results found. Recent Labs    07/31/19 0447  WBC 8.0  HGB 12.9  HCT 37.7  PLT 519*   Recent Labs    07/30/19 2147 07/31/19 0447  NA  --  133*  K  --  4.6  CL  --  104  CO2  --  17*  GLUCOSE 132* 217*  BUN  --  27*  CREATININE  --  0.68  CALCIUM  --  9.2    Intake/Output Summary (Last 24 hours) at 08/01/2019 1919 Last data filed at 08/01/2019 1300 Gross per 24 hour  Intake 340 ml  Output --  Net 340 ml     Physical Exam: Vital Signs Blood pressure 110/77, pulse 100, temperature 98.6 F (37 C), resp. rate 17, SpO2 99 %.  General: Alert and oriented x 3, No apparent distress HEENT: Head is normocephalic, atraumatic, PERRLA, EOMI, sclera anicteric, oral mucosa pink and moist, dentition intact, ext ear canals clear,  Neck: Supple without JVD or lymphadenopathy Heart: Reg rate and rhythm. No murmurs rubs or gallops Chest: CTA bilaterally without wheezes, rales, or rhonchi; no distress Abdomen: Soft, non-tender, non-distended, bowel sounds positive. Extremities: No clubbing, cyanosis, or edema. Pulses are 2+ Skin: Clean and intact without signs of breakdown  Neurologic: Cranial nerves II through XII intact, motor strength is 5/5 in bilateral deltoid, bicep, tricep, grip, hip flexor, knee extensors, ankle dorsiflexor and plantar flexor Sensation intact to LT both feet.  Pedal pulses are normal  Musculoskeletal: Full range of motion in all 4 extremities. No joint  swelling    Assessment/Plan: 1. 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  Physiatrist is providing close team supervision and 24 hour management of active medical problems listed below.  Physiatrist and rehab team continue to assess barriers to discharge/monitor patient progress toward functional and medical goals  Care Tool:  Bathing    Body parts bathed by patient: Right arm, Abdomen, Chest, Left arm, Front perineal area, Left upper leg, Face, Right upper leg, Right lower leg, Left lower leg, Buttocks   Body parts bathed by helper: Buttocks     Bathing assist Assist Level: Supervision/Verbal cueing     Upper Body Dressing/Undressing Upper body dressing   What is the patient wearing?: Pull over shirt, Bra    Upper body assist Assist Level: Set up assist    Lower Body Dressing/Undressing Lower body dressing      What is the patient wearing?: Pants, Underwear/pull up     Lower body assist Assist for lower body dressing: Supervision/Verbal cueing     Toileting Toileting    Toileting assist Assist for toileting: Supervision/Verbal cueing     Transfers Chair/bed transfer  Transfers assist     Chair/bed transfer assist level: Supervision/Verbal cueing     Locomotion Ambulation   Ambulation assist      Assist level: Minimal Assistance -  Patient > 75% Assistive device: Hand held assist Max distance: 31'   Walk 10 feet activity   Assist     Assist level: Moderate Assistance - Patient - 50 - 74% Assistive device: Hand held assist   Walk 50 feet activity   Assist Walk 50 feet with 2 turns activity did not occur: Safety/medical concerns         Walk 150 feet activity   Assist Walk 150 feet activity did not occur: Safety/medical concerns         Walk 10 feet on uneven surface  activity   Assist     Assist  level: Moderate Assistance - Patient - 50 - 74% Assistive device: Aeronautical engineer Will patient use wheelchair at discharge?: No             Wheelchair 50 feet with 2 turns activity    Assist            Wheelchair 150 feet activity     Assist          Blood pressure 110/77, pulse 100, temperature 98.6 F (37 C), resp. rate 17, SpO2 99 %.  Medical Problem List and Plan: 1.Functional and mobility deficitssecondary to left cerebellar infarct- minimal stroke deficits Continue CIR PT, OT, SLP  -ELOS/Goals: tent D/C 6/22,  2. Antithrombotics: -DVT/anticoagulation:Pharmaceutical:Lovenox -antiplatelet therapy: On low dose ASA 3.C6/C7 radiculopathy/Pain Management:s/p ESI- 3/23.Pt with notable multifactorial spondylosis in mid to lower cervical spine -add heating pad.  -utilize muscle relaxant, Muscle balm - therapy addressing posture, ROM -Willalsocontinue Hydrocodone prn.  On high dose gabpentin at night for presumed neuropathic pain but no clinical signs of a severe neuropathy this was stopped.    -continue Topirimate for HA  4. Mood:LCSW to follow for evaluation and support.- hx depression with lability ask psych to eval - pt ok with this  -antipsychotic agents: N/A  -anxiety is a barrier to participation   -add low dose xanax prn in addition to atarax   -request neuropsych eval this week Emotional lability- states she was seen by [facility] psych (did not help) could not give details on Psych diagnosis, ? Need for mood stabilizing medication  5. Neuropsych: This patientiscapable of making decisions on herown behalf. 6. Skin/Wound Care:Routine pressure relief measures. 7. Fluids/Electrolytes/Nutrition:Monitor I/O. Check lytes in am. Low BMI with moderate protein malnutrition  Seen by dietician as OP , poor compliance 8. HTN:  Monitor BP tid--on Lisinoprilwith BP still poorly controlled. SBP >100's. Will likely need titration of meds. Vitals:   07/31/19 2024 08/01/19 0358  BP: 109/89 110/77  Pulse: (!) 101 100  Resp: 18 17  Temp: 98.9 F (37.2 C) 98.6 F (37 C)  SpO2: 100% 99%  controlled on Lotensin 6/15, will reduce and start low dose coreg for tachy  Hypotensive and tachycardic- continue to monitor 9. T2DM post pancreatectomy: Poorly controlled with Hgb A1C- 12.5. Monitor BS ac/hs--poor controlled. Resume 70/30 insulin. Was not usingmetforminPTA.Will d/c Glucerna between meals as likely affecting BS and change to Ensure Max which has 5g net carbs.  CBG (last 3)  Recent Labs    08/01/19 1537 08/01/19 1704 08/01/19 1902  GLUCAP 80 177* 273*  Adjusted 70/30 dose  -6/18 controlled this am will d/c SSI given the labile CBGs, encourage pt to eat at least 50% meals  6/19: Has been receiving q1H CBGs due to brittle diabetes. Sugars maintained in 180s to 200s. Changed to [facility] as patient complaining of no rest  between sticks. CBG did drop to 68 and she received muffin and orange juice and CBG increased to 80. Continued to increase to 176 and did not cover with 70/30 given prior severe hypoglycemic event which required rapid response.  10 Collagen Vascular disease/discoid Lupus: Resume Plaquenil and folic acid.  11. Pancreatic insufficiency/S/p Pancreatectomy: Resume pancrease tid ac. Will resume psyllium and loperemide prn for chronic diarrhea.  12. Dyslipidemia: On high dose statin.  13. Dizziness:Continue low dose antivert. May need vestibular evaluation. 86. H/o Major depressive disorder: Continue Wellbutrin--resume atarax bidprn(for anxiety). Resumed  Remeron.  -add prn xanax as above 15.Tobacco abuse: Willing to try nicotine gum--will order. 16. Vitamin B 12 deficiency: Gets IM injection every month.  17.  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  Insomnia  multifactorial-added ropirinole for restless legs. Increased dose to 1mg  BID.  LOS: 12 days A FACE TO FACE EVALUATION WAS PERFORMED  Clide Deutscher Meela Wareing 08/01/2019, 7:19 PM

## 2019-08-02 ENCOUNTER — Ambulatory Visit (HOSPITAL_COMMUNITY): Payer: Medicare Other

## 2019-08-02 ENCOUNTER — Encounter (HOSPITAL_COMMUNITY): Payer: Medicare Other | Admitting: Occupational Therapy

## 2019-08-02 LAB — GLUCOSE, CAPILLARY
Glucose-Capillary: 244 mg/dL — ABNORMAL HIGH (ref 70–99)
Glucose-Capillary: 237 mg/dL — ABNORMAL HIGH (ref 70–99)
Glucose-Capillary: 237 mg/dL — ABNORMAL HIGH (ref 70–99)
Glucose-Capillary: 179 mg/dL — ABNORMAL HIGH (ref 70–99)
Glucose-Capillary: 289 mg/dL — ABNORMAL HIGH (ref 70–99)
Glucose-Capillary: 106 mg/dL — ABNORMAL HIGH (ref 70–99)
Glucose-Capillary: 128 mg/dL — ABNORMAL HIGH (ref 70–99)
Glucose-Capillary: 175 mg/dL — ABNORMAL HIGH (ref 70–99)
Glucose-Capillary: 192 mg/dL — ABNORMAL HIGH (ref 70–99)

## 2019-08-02 NOTE — Plan of Care (Signed)
Problem: RH BOWEL ELIMINATION Goal: RH STG MANAGE BOWEL WITH ASSISTANCE Description: STG Manage Bowel with Mod I Assistance. Outcome: Progressing   Problem: RH SAFETY Goal: RH STG ADHERE TO SAFETY PRECAUTIONS W/ASSISTANCE/DEVICE Description: STG Adhere to Safety Precautions With Supervision Assistance/Device. Outcome: Progressing   Problem: RH KNOWLEDGE DEFICIT Goal: RH STG INCREASE KNOWLEDGE OF DIABETES Description: Pt will verbalize understanding of diabetes management including diet, exercise, and medication management with cues/reminders assist/cues using educational materials provided by staff.  Outcome: Progressing Goal: RH STG INCREASE KNOWLEDGE OF STROKE PROPHYLAXIS Description: Pt will be able to verbalize stroke risk factors and prevention techniques including smoking cessation, diet management, blood sugar control, and follow up care with physician with cues/reminders assist/cues using educational materials provided by staff.  Outcome: Progressing

## 2019-08-02 NOTE — Progress Notes (Addendum)
Capillary glucose checks Q2-3H with levels remaining stable this shift: 289, 179, 106, 128 mg/dL.  Scheduled 28 units 70/30 insulin BID with this evening's dose held due to poor intake and reported feeling of nauseated/unsteadiness.  Patient has tolerated all meds without difficulty.  Snacks at bedside and encouraged frequently. Nursing will continue to monitor closely.  Tamirah George Montey Hora, MSN, RN, CNL

## 2019-08-02 NOTE — Progress Notes (Signed)
Pt complained of pain to Nurse Tech and said that 300mg  of Gabapentin isn't enough and that she takes more at home and she might as well have her family member bring her home medications. That she has already told the doctor what she takes at home. I offered pt prn pain meds like tylenol or Norco and she doesn't want either of those. I will pass this along to see if anything can be changed or if the doctor could explain to her why she only gets 300mg  at night. Pt is laying in bed, last cbg was 192 at 2230, has snacks and is alert, oriented. No other issues to note at this time.

## 2019-08-02 NOTE — Progress Notes (Addendum)
Occupational Therapy Session Note  Patient Details  Name: TIFFINY WORTHY MRN: [id number] Date of Birth: 09-27-1960  Today's Date: 08/02/2019 OT Individual Time: 8127-5170 OT Individual Time Calculation (min): 56 min   Short Term Goals: Week 2:  OT Short Term Goal 1 (Week 2): STG=LTGs secondary to short LOS  Skilled Therapeutic Interventions/Progress Updates:    Pt greeted in bed, declining shower, stating she wanted to take one tomorrow (grad day). She requested to "walk" during session. Pt first donned shoes with setup, then ambulated around room without AD and CGA to find her head scarf and pick trash off of the floor. When she was ready, pt was escorted via w/c to a community area of the hospital. To work on dynamic standing balance and walker safety, pt ambulated with RW and supervision assistance around the food court area. Vcs for not abandoning device when window shopping around the gift shop and also when looking at art displays. When fatigue set it, pt required vcs to either sit down or bring the RW closer to body. In [place], we discussed diabetic friendly meal options that she could order with dtr before d/c on Tuesday. She was then escorted back to her room and completed an ambulatory transfer to bed using device. Pt returned to bed and was left with all needs within reach, k-pad for mgt of neck pain and a lavender cotton ball inside of her pillowcase to promote relaxation. Pt appreciative. Left her with all needs and bed alarm set.   No family was present for family education. Per pt, family came to rehab yesterday and were not coming back today.    Therapy Documentation Precautions:  Precautions Precautions: Fall Restrictions Weight Bearing Restrictions: No ADL: ADL Eating: Set up Where Assessed-Eating: Chair Grooming: Supervision/safety Where Assessed-Grooming: Sitting at sink Upper Body Bathing: Contact guard Lower Body Bathing: Moderate assistance Upper Body Dressing:  Contact guard Lower Body Dressing: Moderate assistance Toileting: Moderate assistance Toilet Transfer: Moderate assistance (Min-Mod) Toilet Transfer Method: Stand pivot Tub/Shower Transfer: Moderate assistance (hand held assist)   Therapy/Group: Individual Therapy  Claris Pech A Hadasah Brugger 08/02/2019, 12:26 PM

## 2019-08-02 NOTE — Progress Notes (Signed)
Physical Therapy Session Note  Patient Details  Name: Brittany [name] MRN: [id number] Date of Birth: 1960/06/01  Today's Date: 08/02/2019 PT Individual Time: 1300-1325 PT Individual Time Calculation (min): 25 min  and Today's Date: 08/02/2019 PT Missed Time: 35 Minutes Missed Time Reason: Increased agitation;Patient unwilling to participate  Short Term Goals: Week 1:  PT Short Term Goal 1 (Week 1): Pt will maintain sitting balance w/functional task such as while donning shoes and socks w/supervision only PT Short Term Goal 1 - Progress (Week 1): Met PT Short Term Goal 2 (Week 1): Pt will transfer bed to wc w/LRAD and cga PT Short Term Goal 2 - Progress (Week 1): Met PT Short Term Goal 3 (Week 1): Pt will ambulate 23f w/LRAD and cga PT Short Term Goal 3 - Progress (Week 1): Met Week 2:  PT Short Term Goal 1 (Week 2): Pt will ambulate with supervision assist consistently 750fand LRAD PT Short Term Goal 2 (Week 2): Pt will perform bed mobility with supervision A and no use of bed features. PT Short Term Goal 3 (Week 2): Pt will transfer to and from bed with supervision assist and LRAD  Skilled Therapeutic Interventions/Progress Updates:   Received pt sitting in bed eating lunch. Pt surprised and agitated when therapist entered room stating "no one told me I had therapy today". Therapist proceeded to explain weekend therapy schedule to calm pt, however pt with continued frustration regarding not receiving schedule. Pt refused OOB mobility stating "I just want to eat and take a nap". Pt expressed she had family education yesterday and didn't want to work on anything today. Therapist suggested creating HEP for pt and pt agreeable. Therapist provided pt with HEP printout and educated pt on frequency/duration/technique for the exercises listed below: -Mini Squat with Counter Support - 1 x daily - 7 x weekly - 3 sets - 10 reps -Standing Hip Abduction with Counter Support - 1 x daily - 7 x weekly - 3  sets - 10 reps -Standing March with Counter Support - 1 x daily - 7 x weekly - 3 sets - 10 reps -Supine Bridge - 1 x daily - 7 x weekly - 3 sets - 10 reps -Clamshell - 1 x daily - 7 x weekly - 3 sets - 10 reps Pt suddenly became tearful as therapist explained exercise handout. Pt unable to state why she suddenly became emotional but therapist provided emotional support and encouragement to calm pt and provided tissues. Concluded session with pt sitting in bed, needs within reach, and bed alarm on. 35 minutes missed of skilled physical therapy due to pt refusal and agitation.   Therapy Documentation Precautions:  Precautions Precautions: Fall Restrictions Weight Bearing Restrictions: No   Therapy/Group: Individual Therapy AnAlfonse AlpersT, DPT   08/02/2019, 7:36 AM

## 2019-08-02 NOTE — Progress Notes (Signed)
[facility] PHYSICAL MEDICINE & REHABILITATION PROGRESS NOTE   Subjective/Complaints: Would like to go outside for some time. Sugars have been in 100s-200s today. Can decrease CBG frequency to q3H.  Restless leg syndrome better controlled.  Has headache radiating to neck- improved with hydrocodone  ROS: Patient denies N/V/D , no CP or SOB    Objective:   No results found. Recent Labs    07/31/19 0447  WBC 8.0  HGB 12.9  HCT 37.7  PLT 519*   Recent Labs    07/30/19 2147 07/31/19 0447  NA  --  133*  K  --  4.6  CL  --  104  CO2  --  17*  GLUCOSE 132* 217*  BUN  --  27*  CREATININE  --  0.68  CALCIUM  --  9.2    Intake/Output Summary (Last 24 hours) at 08/02/2019 1248 Last data filed at 08/02/2019 0800 Gross per 24 hour  Intake 562 ml  Output --  Net 562 ml     Physical Exam: Vital Signs Blood pressure 114/88, pulse (!) 102, temperature 98.8 F (37.1 C), temperature source Oral, resp. rate 16, SpO2 100 %.   General: Alert and oriented x 3, No apparent distress HEENT: Head is normocephalic, atraumatic, PERRLA, EOMI, sclera anicteric, oral mucosa pink and moist, dentition intact, ext ear canals clear,  Neck: Supple without JVD or lymphadenopathy Heart: Reg rate and rhythm. No murmurs rubs or gallops Chest: CTA bilaterally without wheezes, rales, or rhonchi; no distress Abdomen: Soft, non-tender, non-distended, bowel sounds positive. Extremities: No clubbing, cyanosis, or edema. Pulses are 2+ Skin: Clean and intact without signs of breakdown Neurologic: Cranial nerves II through XII intact, motor strength is 5/5 in bilateral deltoid, bicep, tricep, grip, hip flexor, knee extensors, ankle dorsiflexor and plantar flexor Sensation intact to LT both feet.  Pedal pulses are normal  Musculoskeletal: Full range of motion in all 4 extremities. No joint swelling    Assessment/Plan: 1. 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  which require 3+ hours  per day of interdisciplinary therapy in a comprehensive inpatient rehab setting.  Physiatrist is providing close team supervision and 24 hour management of active medical problems listed below.  Physiatrist and rehab team continue to assess barriers to discharge/monitor patient progress toward functional and medical goals  Care Tool:  Bathing    Body parts bathed by patient: Right arm, Abdomen, Chest, Left arm, Front perineal area, Left upper leg, Face, Right upper leg, Right lower leg, Left lower leg, Buttocks   Body parts bathed by helper: Buttocks     Bathing assist Assist Level: Supervision/Verbal cueing     Upper Body Dressing/Undressing Upper body dressing   What is the patient wearing?: Pull over shirt, Bra    Upper body assist Assist Level: Set up assist    Lower Body Dressing/Undressing Lower body dressing      What is the patient wearing?: Pants, Underwear/pull up     Lower body assist Assist for lower body dressing: Supervision/Verbal cueing     Toileting Toileting    Toileting assist Assist for toileting: Supervision/Verbal cueing     Transfers Chair/bed transfer  Transfers assist     Chair/bed transfer assist level: Supervision/Verbal cueing     Locomotion Ambulation   Ambulation assist      Assist level: Minimal Assistance - Patient > 75% Assistive device: Hand held assist Max distance: 74'   Walk 10 feet activity   Assist     Assist  level: Moderate Assistance - Patient - 50 - 74% Assistive device: Hand held assist   Walk 50 feet activity   Assist Walk 50 feet with 2 turns activity did not occur: Safety/medical concerns         Walk 150 feet activity   Assist Walk 150 feet activity did not occur: Safety/medical concerns         Walk 10 feet on uneven surface  activity   Assist     Assist level: Moderate Assistance - Patient - 50 - 74% Assistive device: Aeronautical engineer Will  patient use wheelchair at discharge?: No             Wheelchair 50 feet with 2 turns activity    Assist            Wheelchair 150 feet activity     Assist          Blood pressure 114/88, pulse (!) 102, temperature 98.8 F (37.1 C), temperature source Oral, resp. rate 16, SpO2 100 %.  Medical Problem List and Plan: 1.Functional and mobility deficitssecondary to left cerebellar infarct- minimal stroke deficits Continue CIR PT, OT, SLP  -ELOS/Goals: tent D/C 6/22,  2. Antithrombotics: -DVT/anticoagulation:Pharmaceutical:Ambulating >500 feet- will d/c Lovenox -antiplatelet therapy: On low dose ASA 3.C6/C7 radiculopathy/Pain Management:s/p ESI- 3/23.Pt with notable multifactorial spondylosis in mid to lower cervical spine -add heating pad.  -utilize muscle relaxant, Muscle balm - therapy addressing posture, ROM -Willalsocontinue Hydrocodone prn.Has been helping with headache.   On high dose gabpentin at night for presumed neuropathic pain but no clinical signs of a severe neuropathy this was stopped.    -continue Topirimate for HA  4. Mood:LCSW to follow for evaluation and support.- hx depression with lability ask psych to eval - pt ok with this  -antipsychotic agents: N/A  -anxiety is a barrier to participation   -add low dose xanax prn in addition to atarax   -request neuropsych eval this week Emotional lability- states she was seen by [facility] psych (did not help) could not give details on Psych diagnosis, ? Need for mood stabilizing medication  5. Neuropsych: This patientiscapable of making decisions on herown behalf. 6. Skin/Wound Care:Routine pressure relief measures. 7. Fluids/Electrolytes/Nutrition:Monitor I/O. Check lytes in am. Low BMI with moderate protein malnutrition  Seen by dietician as OP , poor compliance 8. HTN: Monitor BP tid--on  Lisinoprilwith BP still poorly controlled. SBP >100's. Will likely need titration of meds. Vitals:   08/02/19 0307 08/02/19 0830  BP: 111/76 114/88  Pulse: 100 (!) 102  Resp: 19 16  Temp: 98.2 F (36.8 C) 98.8 F (37.1 C)  SpO2: 97% 100%  controlled on Lotensin 6/15, will reduce and start low dose coreg for tachy  Hypotensive and tachycardic- continue to monitor 9. T2DM post pancreatectomy: Poorly controlled with Hgb A1C- 12.5. Monitor BS ac/hs--poor controlled. Resume 70/30 insulin. Was not usingmetforminPTA.Will d/c Glucerna between meals as likely affecting BS and change to Ensure Max which has 5g net carbs.  CBG (last 3)  Recent Labs    08/02/19 0458 08/02/19 0818 08/02/19 1036  GLUCAP 237* 289* 179*  Adjusted 70/30 dose  -6/18 controlled this am will d/c SSI given the labile CBGs, encourage pt to eat at least 50% meals  6/19: Has been receiving q1H CBGs due to brittle diabetes. Sugars maintained in 180s to 200s. Changed to [facility] as patient complaining of no rest between sticks. CBG did drop to 68 and she received muffin  and orange juice and CBG increased to 80. Continued to increase to 176 and did not cover with 70/30 given prior severe hypoglycemic event which required rapid response.   6/20: CBGs 100s-200s today. Decrease CBG frequency to [facility] 10 Collagen Vascular disease/discoid Lupus: Resume Plaquenil and folic acid.  11. Pancreatic insufficiency/S/p Pancreatectomy: Resume pancrease tid ac. Will resume psyllium and loperemide prn for chronic diarrhea.  12. Dyslipidemia: On high dose statin.  13. Dizziness:Continue low dose antivert. May need vestibular evaluation. 50. H/o Major depressive disorder: Continue Wellbutrin--resume atarax bidprn(for anxiety). Resumed  Remeron.  -add prn xanax as above 15.Tobacco abuse: Willing to try nicotine gum--will order. 16. Vitamin B 12 deficiency: Gets IM injection every month.  17.  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  Insomnia multifactorial-added ropirinole for restless legs. Increased dose to 1mg  BID.  LOS: 13 days A FACE TO FACE EVALUATION WAS PERFORMED  Clide Deutscher Merian Wroe 08/02/2019, 12:48 PM

## 2019-08-03 ENCOUNTER — Inpatient Hospital Stay (HOSPITAL_COMMUNITY): Payer: Medicare Other | Admitting: Occupational Therapy

## 2019-08-03 ENCOUNTER — Inpatient Hospital Stay (HOSPITAL_COMMUNITY): Payer: Medicare Other

## 2019-08-03 ENCOUNTER — Inpatient Hospital Stay (HOSPITAL_COMMUNITY): Payer: Medicare Other | Admitting: Physical Therapy

## 2019-08-03 LAB — BASIC METABOLIC PANEL
Anion gap: 11 (ref 5–15)
BUN: 16 mg/dL (ref 6–20)
CO2: 21 mmol/L — ABNORMAL LOW (ref 22–32)
Calcium: 9.3 mg/dL (ref 8.9–10.3)
Chloride: 104 mmol/L (ref 98–111)
Creatinine, Ser: 0.74 mg/dL (ref 0.44–1.00)
GFR calc Af Amer: >60 mL/min (ref >60)
GFR calc non Af Amer: >60 mL/min (ref >60)
Glucose, Bld: 260 mg/dL — ABNORMAL HIGH (ref 70–99)
Potassium: 3.9 mmol/L (ref 3.5–5.1)
Sodium: 136 mmol/L (ref 135–145)

## 2019-08-03 LAB — GLUCOSE, CAPILLARY
Glucose-Capillary: 203 mg/dL — ABNORMAL HIGH (ref 70–99)
Glucose-Capillary: 229 mg/dL — ABNORMAL HIGH (ref 70–99)
Glucose-Capillary: 223 mg/dL — ABNORMAL HIGH (ref 70–99)
Glucose-Capillary: 158 mg/dL — ABNORMAL HIGH (ref 70–99)
Glucose-Capillary: 155 mg/dL — ABNORMAL HIGH (ref 70–99)
Glucose-Capillary: 181 mg/dL — ABNORMAL HIGH (ref 70–99)

## 2019-08-03 LAB — CBC
HCT: 37.1 % (ref 36.0–46.0)
Hemoglobin: 12.6 g/dL (ref 12.0–15.0)
MCH: 31.3 pg (ref 26.0–34.0)
MCHC: 34 g/dL (ref 30.0–36.0)
MCV: 92.3 fL (ref 80.0–100.0)
Platelets: 530 10*3/uL — ABNORMAL HIGH (ref 150–400)
RBC: 4.02 MIL/uL (ref 3.87–5.11)
RDW: 12 % (ref 11.5–15.5)
WBC: 8.5 10*3/uL (ref 4.0–10.5)
nRBC: 0 % (ref 0.0–0.2)

## 2019-08-03 MED ORDER — GABAPENTIN 300 MG PO CAPS
600.0000 mg | ORAL_CAPSULE | Freq: Every day | ORAL | Status: DC
  Administered 2019-08-03: 600 mg via ORAL
  Filled 2019-08-03: qty 2

## 2019-08-03 MED ORDER — METFORMIN HCL 500 MG PO TABS
1000.0000 mg | ORAL_TABLET | Freq: Every day | ORAL | Status: DC
  Administered 2019-08-03 – 2019-08-04 (×2): 1000 mg via ORAL
  Filled 2019-08-03 (×2): qty 2

## 2019-08-03 MED ORDER — BUPROPION HCL 75 MG PO TABS
75.0000 mg | ORAL_TABLET | Freq: Two times a day (BID) | ORAL | Status: AC
  Administered 2019-08-03 – 2019-08-04 (×3): 75 mg via ORAL
  Filled 2019-08-03 (×3): qty 1

## 2019-08-03 MED ORDER — TOPIRAMATE 25 MG PO TABS
25.0000 mg | ORAL_TABLET | Freq: Two times a day (BID) | ORAL | Status: DC
  Administered 2019-08-03: 25 mg via ORAL
  Filled 2019-08-03: qty 1

## 2019-08-03 MED ORDER — CARBAMIDE PEROXIDE 6.5 % OT SOLN
10.0000 [drp] | Freq: Two times a day (BID) | OTIC | Status: DC
  Administered 2019-08-03 – 2019-08-04 (×2): 10 [drp] via OTIC

## 2019-08-03 NOTE — Plan of Care (Signed)
°  Problem: Consults Goal: RH STROKE PATIENT EDUCATION Description: See Patient Education module for education specifics  Outcome: Progressing   Problem: RH BOWEL ELIMINATION Goal: RH STG MANAGE BOWEL WITH ASSISTANCE Description: STG Manage Bowel with Mod I Assistance. Outcome: Progressing   Problem: RH SAFETY Goal: RH STG ADHERE TO SAFETY PRECAUTIONS W/ASSISTANCE/DEVICE Description: STG Adhere to Safety Precautions With Supervision Assistance/Device. Outcome: Progressing   Problem: RH COGNITION-NURSING Goal: RH STG ANTICIPATES NEEDS/CALLS FOR ASSIST W/ASSIST/CUES Description: STG Anticipates Needs/Calls for Assist With Supervision Assistance/Cues. Outcome: Progressing   Problem: RH KNOWLEDGE DEFICIT Goal: RH STG INCREASE KNOWLEDGE OF DIABETES Description: Pt will verbalize understanding of diabetes management including diet, exercise, and medication management with cues/reminders assist/cues using educational materials provided by staff.  Outcome: Progressing Goal: RH STG INCREASE KNOWLEDGE OF STROKE PROPHYLAXIS Description: Pt will be able to verbalize stroke risk factors and prevention techniques including smoking cessation, diet management, blood sugar control, and follow up care with physician with cues/reminders assist/cues using educational materials provided by staff.  Outcome: Progressing

## 2019-08-03 NOTE — Progress Notes (Signed)
Occupational Therapy Discharge Summary  Patient Details  Name: Brittany [name] MRN: [id number] Date of Birth: Feb 04, 1961  Today's Date: 08/03/2019 OT Individual Time:  - 60 minutes missed due to fatigue/pt refusal      Patient has met 10 of 10 long term goals due to improved activity tolerance, improved balance, postural control and improved coordination.  Patient to discharge at overall Supervision level.  Patient's care partner did not attend scheduled family education for OT but did review shower transfers and recommended DME for shower during their PT family ed session.  All goals met.    Recommendation:  Patient will benefit from ongoing skilled OT services in home health setting to continue to advance functional skills in the area of BADL and iADL.  Equipment: shower seat  Reasons for discharge: treatment goals met and discharge from hospital  Patient/family agrees with progress made and goals achieved: Yes   Skilled Therapeutic Intervention:  Pt greeted in bed, asleep and sleeping soundly. Tried to rouse pt via tactile and verbal stimulation, also turned on the overhead lights. Pt opened her eyes once while OT was talking to her and then closed them, and did not open eyes again after. Per RN, pt told night shift staff that she wasn't going to participate in AM therapies today. Time missed due to pt fatigue/refusal.   OT Discharge Precautions/Restrictions  Precautions Precautions: Fall Restrictions Weight Bearing Restrictions: No ADL ADL Eating: Set up (per most recent staff documentation) Where Assessed-Eating: Chair Grooming: Setup (per most recent staff documentation) Where Assessed-Grooming: Sitting at sink Upper Body Bathing: Supervision/safety Where Assessed-Upper Body Bathing: Shower Lower Body Bathing: Supervision/safety Where Assessed-Lower Body Bathing: Shower (per most recent staff documentation) Upper Body Dressing: Setup Where Assessed-Upper Body Dressing:  Wheelchair Lower Body Dressing: Supervision/safety (per most recent staff documentation) Where Assessed-Lower Body Dressing: Wheelchair Toileting: Supervision/safety (per most recent staff documentation) Where Assessed-Toileting: Glass blower/designer: Close supervision Toilet Transfer Method: Ambulating (with RW) Tub/Shower Transfer: Moderate assistance (hand held assist) Social research officer, government: Close supervision Social research officer, government Method: Ambulating (RW) Cognition Overall Cognitive Status: Impaired/Different from baseline Orientation Level: Oriented X4 Sensation Sensation Light Touch: Appears Intact Motor  Motor Motor: Ataxia Motor - Discharge Observations: Mild ataxia still present but improved from Eval Mobility  Supervision functional bathroom transfers using RW at ambulatory level  Balance Balance Balance Assessed: Yes Dynamic Sitting Balance Dynamic Sitting - Balance Support: During functional activity;No upper extremity supported Dynamic Sitting - Level of Assistance: 6: Modified independent (Device/Increase time) (donning shoes EOB) Dynamic Standing Balance Dynamic Standing - Balance Support: During functional activity;No upper extremity supported Dynamic Standing - Level of Assistance: 5: Stand by assistance Dynamic Standing - Balance Activities: Lateral lean/weight shifting;Forward lean/weight shifting (LB dressing) Extremity/Trunk Assessment RUE Assessment RUE Assessment: Within Functional Limits LUE Assessment LUE Assessment: Within Functional Limits   Rudolfo Brandow A Kurk Corniel 08/03/2019, 12:23 PM

## 2019-08-03 NOTE — Progress Notes (Signed)
Physical Therapy Discharge Summary  Patient Details  Name: Brittany [name] MRN: [id number] Date of Birth: December 22, 1960  Today's Date: 08/03/2019      Patient has met 9 of 9 long term goals due to improved activity tolerance, improved balance, improved postural control, increased strength, improved attention, improved awareness and improved coordination.  Patient to discharge at an ambulatory level Supervision.   Patient's care partner is independent to provide the necessary physical assistance at discharge.  Reasons goals not met: N/A all goals met  Patient will benefit from ongoing skilled PT services in home health setting to continue to advance safe functional mobility, address ongoing impairments in balance, safety, gait, coordination, and minimize fall risk.  Equipment: RW  Reasons for discharge: treatment goals met  Patient/family agrees with progress made and goals achieved: Yes  PT Discharge Precautions/Restrictions Precautions Precautions: Fall Vital Signs  Pain Pain Assessment Pain Scale: 0-10 Pain Score: 6  Pain Type: Acute pain Pain Location: Head Pain Orientation: Right;Left Pain Descriptors / Indicators: Aching Pain Frequency: Intermittent Pain Onset: On-going Patients Stated Pain Goal: 2 Pain Intervention(s): Repositioned     Cognition Overall Cognitive Status: Impaired/Different from baseline Sensation Sensation Light Touch: Appears Intact Motor  Motor Motor: Ataxia Motor - Discharge Observations: Mild ataxia still present but improved from Eval  Mobility Bed Mobility Bed Mobility: Rolling Right;Rolling Left;Right Sidelying to Sit;Sit to Supine Rolling Right: Independent Rolling Left: Independent Sit to Supine: Independent Transfers Transfers: Sit to Stand;Stand to Sit;Stand Pivot Transfers Sit to Stand: Supervision/Verbal cueing Stand to Sit: Supervision/Verbal cueing Stand Pivot Transfers: Supervision/Verbal cueing Transfer (Assistive  device): Rolling walker Locomotion  Gait Ambulation: Yes Gait Assistance: Supervision/Verbal cueing Gait Distance (Feet): 200 Feet Assistive device: Rolling walker Gait Gait: Yes Gait Pattern: Impaired Stairs / Additional Locomotion Stairs: Yes Stairs Assistance: Supervision/Verbal cueing Stair Management Technique: One rail Left;One rail Right Number of Stairs: 8 Height of Stairs: 6 Wheelchair Mobility Wheelchair Mobility: No  Trunk/Postural Assessment  Cervical Assessment Cervical Assessment: Exceptions to [facility] (forward head) Thoracic Assessment Thoracic Assessment: Within Functional Limits Lumbar Assessment Lumbar Assessment: Within Functional Limits Postural Control Postural Control: Within Functional Limits  Balance Balance Balance Assessed: Yes Static Sitting Balance Static Sitting - Balance Support: No upper extremity supported;Feet supported Static Sitting - Level of Assistance: 6: Modified independent (Device/Increase time) Dynamic Sitting Balance Dynamic Sitting - Level of Assistance: 6: Modified independent (Device/Increase time) Static Standing Balance Static Standing - Balance Support: Bilateral upper extremity supported Static Standing - Level of Assistance: 5: Stand by assistance Dynamic Standing Balance Dynamic Standing - Balance Support: Bilateral upper extremity supported Dynamic Standing - Level of Assistance: 5: Stand by assistance Extremity Assessment      RLE Assessment RLE Assessment: Within Functional Limits LLE Assessment LLE Assessment: Within Functional Limits    [name] 08/05/2019, 6:05 PM

## 2019-08-03 NOTE — Progress Notes (Signed)
[facility] PHYSICAL MEDICINE & REHABILITATION PROGRESS NOTE   Subjective/Complaints: CBGs improving  Pt requests increase in gabapentin to home dose of 600mg  qhs Pt had questions about equipment for [facility] notes  ROS: Patient denies N/V/D , no CP or SOB    Objective:   No results found. No results for input(s): WBC, HGB, HCT, PLT in the last 72 hours. No results for input(s): NA, K, CL, CO2, GLUCOSE, BUN, CREATININE, CALCIUM in the last 72 hours.  Intake/Output Summary (Last 24 hours) at 08/03/2019 0707 Last data filed at 08/03/2019 0234 Gross per 24 hour  Intake 1626 ml  Output --  Net 1626 ml     Physical Exam: Vital Signs Blood pressure (!) 131/94, pulse 99, temperature 99 F (37.2 C), temperature source Oral, resp. rate 18, SpO2 97 %. General: No acute distress Mood and affect are appropriate Heart: Regular rate and rhythm no rubs murmurs or extra sounds Lungs: Clear to auscultation, breathing unlabored, no rales or wheezes Abdomen: Positive bowel sounds, soft nontender to palpation, nondistended Extremities: No clubbing, cyanosis, or edema Skin: No evidence of breakdown, no evidence of rash  Neurologic: Cranial nerves II through XII intact, motor strength is 5/5 in bilateral deltoid, bicep, tricep, grip, hip flexor, knee extensors, ankle dorsiflexor and plantar flexor Sensation intact to LT both feet.  Pedal pulses are normal  Musculoskeletal: Full range of motion in all 4 extremities. No joint swelling    Assessment/Plan: 1. 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  Physiatrist is providing close team supervision and 24 hour management of active medical problems listed below.  Physiatrist and rehab team continue to assess barriers to discharge/monitor patient progress toward functional and medical goals  Care  Tool:  Bathing    Body parts bathed by patient: Right arm, Abdomen, Chest, Left arm, Front perineal area, Left upper leg, Face, Right upper leg, Right lower leg, Left lower leg, Buttocks   Body parts bathed by helper: Buttocks     Bathing assist Assist Level: Supervision/Verbal cueing     Upper Body Dressing/Undressing Upper body dressing   What is the patient wearing?: Pull over shirt, Bra    Upper body assist Assist Level: Set up assist    Lower Body Dressing/Undressing Lower body dressing      What is the patient wearing?: Pants, Underwear/pull up     Lower body assist Assist for lower body dressing: Supervision/Verbal cueing     Toileting Toileting    Toileting assist Assist for toileting: Supervision/Verbal cueing     Transfers Chair/bed transfer  Transfers assist     Chair/bed transfer assist level: Supervision/Verbal cueing     Locomotion Ambulation   Ambulation assist      Assist level: Minimal Assistance - Patient > 75% Assistive device: Hand held assist Max distance: 50'   Walk 10 feet activity   Assist     Assist level: Moderate Assistance - Patient - 50 - 74% Assistive device: Hand held assist   Walk 50 feet activity   Assist Walk 50 feet with 2 turns activity did not occur: Safety/medical concerns         Walk 150 feet activity   Assist Walk 150 feet activity did not occur: Safety/medical concerns         Walk 10 feet on uneven surface  activity   Assist     Assist level: Moderate Assistance -  Patient - 50 - 74% Assistive device: Aeronautical engineer Will patient use wheelchair at discharge?: No             Wheelchair 50 feet with 2 turns activity    Assist            Wheelchair 150 feet activity     Assist          Blood pressure (!) 131/94, pulse 99, temperature 99 F (37.2 C), temperature source Oral, resp. rate 18, SpO2 97 %.  Medical Problem List and  Plan: 1.Functional and mobility deficitssecondary to left cerebellar infarct- minimal stroke deficits Continue CIR PT, OT, SLP  -ELOS/Goals: tent D/C 6/22,  2. Antithrombotics: -DVT/anticoagulation:Pharmaceutical:Ambulating >500 feet- will d/c Lovenox -antiplatelet therapy: On low dose ASA 3.C6/C7 radiculopathy/Pain Management:s/p ESI- 3/23.Pt with notable multifactorial spondylosis in mid to lower cervical spine -add heating pad.  -utilize muscle relaxant, Muscle balm - therapy addressing posture, ROM -Willalsocontinue Hydrocodone prn.Has been helping with headache.   On high dose gabpentin at night for presumed neuropathic pain but no clinical signs of a severe neuropathy this was stopped.    -wean off Topirimate for HA no improvement + potential interaction with metformin 4. Mood:LCSW to follow for evaluation and support.- hx depression with lability ask psych to eval - pt ok with this  -antipsychotic agents: N/A  -anxiety is a barrier to participation   -add low dose xanax prn in addition to atarax   Emotional lability- states she was seen by [facility] psych (did not help) could not give details on Psych diagnosis,Foley Laclede advised starting Depakote 250mg  BID  5. Neuropsych: This patientiscapable of making decisions on herown behalf. 6. Skin/Wound Care:Routine pressure relief measures. 7. Fluids/Electrolytes/Nutrition:Monitor I/O. Check lytes in am. Low BMI with moderate protein malnutrition  Seen by dietician as OP , poor compliance 8. HTN: Monitor BP tid--on Lisinoprilwith BP still poorly controlled. SBP >100's. Will likely need titration of meds. Vitals:   08/02/19 1928 08/03/19 0436  BP: 122/86 (!) 131/94  Pulse: 87 99  Resp: 18 18  Temp: (!) 97.5 F (36.4 C) 99 F (37.2 C)  SpO2: 100% 97%  controlled on Lotensin 6/15, will reduce and start low dose coreg for  tachy  Hypotensive and tachycardic- continue to monitor 9. T2DM post pancreatectomy: Poorly controlled with Hgb A1C- 12.5. Monitor BS ac/hs--poor controlled. Resume 70/30 insulin. Was not usingmetforminPTA.Will d/c Glucerna between meals as likely affecting BS and change to Ensure Max which has 5g net carbs.  CBG (last 3)  Recent Labs    08/02/19 2227 08/03/19 0138 08/03/19 0432  GLUCAP 192* 203* 229*  Adjusted 70/30 dose  -6/18 controlled this am will d/c SSI given the labile CBGs, encourage pt to eat at least 50% meals  6/21 Improving will reduce CBG to ac hs, increase metformin to 1000mg   qam , home dose 10 Collagen Vascular disease/discoid Lupus: Resume Plaquenil and folic acid.  11. Pancreatic insufficiency/S/p Pancreatectomy: Resume pancrease tid ac. Will resume psyllium and loperemide prn for chronic diarrhea.  12. Dyslipidemia: On high dose statin.  13. Dizziness:Continue low dose antivert. May need vestibular evaluation. 98. H/o Major depressive disorder: Continue Wellbutrin--resume atarax bidprn(for anxiety). Resumed  Remeron.  -add prn xanax as above 15.Tobacco abuse: Willing to try nicotine gum--will order. 16. Vitamin B 12 deficiency: Gets IM injection every month.  17.  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  Insomnia multifactorial-added ropirinole for restless legs.  Increased dose to 1mg  BID.  LOS: 14 days A FACE TO FACE EVALUATION WAS PERFORMED  Charlett Blake 08/03/2019, 7:07 AM

## 2019-08-03 NOTE — Progress Notes (Signed)
Patient ID: Brittany [name], female   DOB: 07/30/1960, 59 y.o.   MRN: [id number]   Shower chair ordered through [employer]

## 2019-08-03 NOTE — Progress Notes (Signed)
Occupational Therapy Session Note  Patient Details  Name: Brittany [name] MRN: [id number] Date of Birth: 06/22/1960  Today's Date: 08/03/2019 OT Individual Time: 1000-1010 OT Individual Time Calculation (min): 10 min  and Today's Date: 08/03/2019 OT Missed Time: 20 Minutes Missed Time Reason: Patient unwilling/refused to participate without medical reason   Short Term Goals: Week 2:  OT Short Term Goal 1 (Week 2): STG=LTGs secondary to short LOS  Skilled Therapeutic Interventions/Progress Updates:    Upon entering the room, pt seated on EOB starting breakfast. Pt asking therapist to heat up meal tray. Pt requesting no further assistance and eating breakfast as therapist exits the room. 20 missed minutes secondary to eating meal.   Therapy Documentation Precautions:  Precautions Precautions: Fall Restrictions Weight Bearing Restrictions: No General: General OT Amount of Missed Time: 20 Minutes   ADL: ADL Eating: Set up (per most recent staff documentation) Where Assessed-Eating: Chair Grooming: Setup (per most recent staff documentation) Where Assessed-Grooming: Sitting at sink Upper Body Bathing: Supervision/safety Where Assessed-Upper Body Bathing: Shower Lower Body Bathing: Supervision/safety Where Assessed-Lower Body Bathing: Shower (per most recent staff documentation) Upper Body Dressing: Setup Where Assessed-Upper Body Dressing: Wheelchair Lower Body Dressing: Supervision/safety (per most recent staff documentation) Where Assessed-Lower Body Dressing: Wheelchair Toileting: Supervision/safety (per most recent staff documentation) Where Assessed-Toileting: Glass blower/designer: Close supervision Toilet Transfer Method: Ambulating (with RW) Tub/Shower Transfer: Moderate assistance (hand held assist) Social research officer, government: Close supervision Walk-In Shower Transfer Method: Ambulating (RW)   Therapy/Group: Individual Therapy  Gypsy Decant 08/03/2019, 12:44  PM

## 2019-08-03 NOTE — Progress Notes (Signed)
Physical Therapy Session Note  Patient Details  Name: Brittany [name] MRN: [id number] Date of Birth: 06/16/1960  Today's Date: 08/03/2019     Short Term Goals: Week 2:  PT Short Term Goal 1 (Week 2): Pt will ambulate with supervision assist consistently 50ft and LRAD PT Short Term Goal 2 (Week 2): Pt will perform bed mobility with supervision A and no use of bed features. PT Short Term Goal 3 (Week 2): Pt will transfer to and from bed with supervision assist and LRAD  Skilled Therapeutic Interventions/Progress Updates: Pt sleeping upon PTA entering room. PTA able to arouse pt however pt rolling over and ignoring PTA. PTA advised this is pt's last day and beneficial to participate however pt continued to ignore. PTA left room and re-attempted approx 10 min later with same response. Pt missed 45 min of skilled PT due to pt's refusal to participate. D/C info compiled from previous notes.      Therapy Documentation Precautions:  Precautions Precautions: Fall Restrictions Weight Bearing Restrictions: No General: PT Amount of Missed Time (min): 45 Minutes Vital Signs:  Pain: Pain Assessment Pain Scale: 0-10 Pain Score: 6  Pain Type: Acute pain Pain Location: Head Pain Orientation: Right;Left Pain Descriptors / Indicators: Aching Pain Frequency: Intermittent Pain Onset: On-going Patients Stated Pain Goal: 2 Pain Intervention(s): Repositioned    Therapy/Group: Individual Therapy  Riki Berninger  Savonna Birchmeier, PTA  08/03/2019, 8:46 AM

## 2019-08-03 NOTE — Plan of Care (Signed)
  Problem: Consults Goal: RH STROKE PATIENT EDUCATION Description: See Patient Education module for education specifics  Outcome: Progressing   Problem: RH BOWEL ELIMINATION Goal: RH STG MANAGE BOWEL WITH ASSISTANCE Description: STG Manage Bowel with Mod I Assistance. Outcome: Progressing   Problem: RH SAFETY Goal: RH STG ADHERE TO SAFETY PRECAUTIONS W/ASSISTANCE/DEVICE Description: STG Adhere to Safety Precautions With Supervision Assistance/Device. Outcome: Progressing   Problem: RH COGNITION-NURSING Goal: RH STG ANTICIPATES NEEDS/CALLS FOR ASSIST W/ASSIST/CUES Description: STG Anticipates Needs/Calls for Assist With Supervision Assistance/Cues. Outcome: Progressing   Problem: RH KNOWLEDGE DEFICIT Goal: RH STG INCREASE KNOWLEDGE OF DIABETES Description: Pt will verbalize understanding of diabetes management including diet, exercise, and medication management with cues/reminders assist/cues using educational materials provided by staff.  Outcome: Progressing Goal: RH STG INCREASE KNOWLEDGE OF STROKE PROPHYLAXIS Description: Pt will be able to verbalize stroke risk factors and prevention techniques including smoking cessation, diet management, blood sugar control, and follow up care with physician with cues/reminders assist/cues using educational materials provided by staff.  Outcome: Progressing

## 2019-08-03 NOTE — Progress Notes (Signed)
Speech Language Pathology Discharge Summary  Patient Details  Name: Brittany [name] MRN: [id number] Date of Birth: 12/09/1960  Today's Date: 08/03/2019 SLP Individual Time: 8786-7672 SLP Individual Time Calculation (min): 58 min   Skilled Therapeutic Interventions:  Skilled ST services focused on education and cognitive skills. SLP facilitated recall of current medication given list, pt required supervision A verbal cues to locate name/function/times per day of medication. Pt required continued encouragement to participate in recall task and breaks were found to helpful due to periods of emotion lability. Pt refused to complex medication management task , but was agreeable to allow daughter to assist with medication. Pt became agitated when SLP requested daughter help with money management as well since task were not able to be targeted. Pt expressed concerns of current medication and SLP assisted pt in recall strategies, in creating a list of questions fo PA at discharge regarding medication. Pt refused to complete further complex problem solving task, stating " this is stressing me out and will give me another stroke." SLP provided education pertaining to recall strategies and complex problem solving tasks allowing daughter to assist. Pt agreed. All questions answered to satisfaction. SLP called daughter to provided education, however was unavailable at this time. Pt was left in room with call bell within reach and bed alarm set. ST recommends to continue skilled ST services.     Patient has met 1 of 3 long term goals.  Patient to discharge at overall Supervision;Min level.  Reasons goals not met: limited participation   Clinical Impression/Discharge Summary:    Pt met 1 out 3 goals, discharging at supervision A for recall with aid and likely min A for complex problem solving and recall of complex information on a daily basis. Due to behaviors, emotional liability, more than a reason able of time to  complete task and refusal to participate in skilled ST services, complex problem solving tasks and recall of daily complex information tasks were not completed in their entirety, such as medication and money management. Education was provided to pt , memory hand out given and daughter was contacted via phone, however unavailable at that time. Pt agreed to allow daughter to assist in higher complex task and to utilizing recall strategies. SLP suspect near cognitive baseline with possible novel short term memory recall deficits, however difficult to assess due to barriers listed above. Pt would continue to benefit from skilled ST services in order to maximize functional independence and reduce burden of care, although questions participation in continued services.  Care Partner:  Caregiver Able to Provide Assistance: Yes  Type of Caregiver Assistance: Physical;Cognitive  Recommendation:  Outpatient SLP;24 hour supervision/assistance;Home Health SLP  Rationale for SLP Follow Up: Maximize cognitive function and independence;Reduce caregiver burden   Equipment: N/A   Reasons for discharge: Discharged from hospital   Patient/Family Agrees with Progress Made and Goals Achieved: Yes    [name]  [facility] 08/03/2019, 4:13 PM

## 2019-08-04 LAB — GLUCOSE, CAPILLARY
Glucose-Capillary: 245 mg/dL — ABNORMAL HIGH (ref 70–99)
Glucose-Capillary: 270 mg/dL — ABNORMAL HIGH (ref 70–99)

## 2019-08-04 MED ORDER — NOVOLIN 70/30 (70-30) 100 UNIT/ML ~~LOC~~ SUSP
28.0000 [IU] | Freq: Two times a day (BID) | SUBCUTANEOUS | 1 refills | Status: DC
Start: 2019-08-04 — End: 2021-05-09

## 2019-08-04 MED ORDER — DIVALPROEX SODIUM 125 MG PO CSDR: 250.0000 mg | DELAYED_RELEASE_CAPSULE | Freq: Two times a day (BID) | ORAL | 0 refills | Status: DC

## 2019-08-04 MED ORDER — MUSCLE RUB 10-15 % EX CREA: 1.0000 "application " | TOPICAL_CREAM | Freq: Two times a day (BID) | CUTANEOUS | 0 refills | Status: DC | PRN

## 2019-08-04 MED ORDER — ROPINIROLE HCL 1 MG PO TABS: 1.0000 mg | ORAL_TABLET | Freq: Two times a day (BID) | ORAL | 0 refills | Status: DC

## 2019-08-04 MED ORDER — BENAZEPRIL HCL 10 MG PO TABS: 5.0000 mg | ORAL_TABLET | Freq: Every day | ORAL | 0 refills | Status: DC

## 2019-08-04 MED ORDER — CITALOPRAM HYDROBROMIDE 10 MG PO TABS: 10.0000 mg | ORAL_TABLET | Freq: Every day | ORAL | 0 refills | Status: DC

## 2019-08-04 MED ORDER — METFORMIN HCL 1000 MG PO TABS: 1000.0000 mg | ORAL_TABLET | Freq: Every day | ORAL | 0 refills | Status: DC

## 2019-08-04 MED ORDER — ATORVASTATIN CALCIUM 80 MG PO TABS: 80.0000 mg | ORAL_TABLET | Freq: Every evening | ORAL | 0 refills | Status: DC

## 2019-08-04 MED ORDER — ADULT MULTIVITAMIN W/MINERALS CH: 1.0000 | ORAL_TABLET | Freq: Every day | ORAL | Status: DC

## 2019-08-04 MED ORDER — NICOTINE POLACRILEX 2 MG MT GUM: 2.0000 mg | CHEWING_GUM | OROMUCOSAL | 0 refills | Status: DC | PRN

## 2019-08-04 MED ORDER — CARVEDILOL 3.125 MG PO TABS: 3.1250 mg | ORAL_TABLET | Freq: Two times a day (BID) | ORAL | 0 refills | Status: DC

## 2019-08-04 MED ORDER — GABAPENTIN 600 MG PO TABS: 600.0000 mg | ORAL_TABLET | Freq: Every day | ORAL | 0 refills | Status: DC

## 2019-08-04 MED ORDER — MIRTAZAPINE 15 MG PO TABS: 15.0000 mg | ORAL_TABLET | Freq: Every day | ORAL | 0 refills | Status: DC

## 2019-08-04 NOTE — Discharge Summary (Signed)
Physician Discharge Summary  Patient ID: Brittany [name] MRN: [id number] DOB/AGE: 05-22-1960 59 y.o.  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) Active Problems:   Anxiety   DMII (diabetes mellitus, type 2) (HCC)   Hypertension associated with diabetes ([place])   Malnutrition of moderate degree   Major depressive disorder, recurrent episode, moderate (HCC)   Discharged Condition: stable   Significant Diagnostic Studies: N/A    Labs:  Basic Metabolic Panel: BMP Latest Ref Rng & Units 08/03/2019 07/31/2019 07/30/2019  Glucose 70 - 99 mg/dL 260(H) 217(H) 132(H)  BUN 6 - 20 mg/dL 16 27(H) -  Creatinine 0.44 - 1.00 mg/dL 0.74 0.68 -  Sodium 135 - 145 mmol/L 136 133(L) -  Potassium 3.5 - 5.1 mmol/L 3.9 4.6 -  Chloride 98 - 111 mmol/L 104 104 -  CO2 22 - 32 mmol/L 21(L) 17(L) -  Calcium 8.9 - 10.3 mg/dL 9.3 9.2 -    CBC: CBC Latest Ref Rng & Units 08/03/2019 07/31/2019 07/27/2019  WBC 4.0 - 10.5 K/uL 8.5 8.0 6.8  Hemoglobin 12.0 - 15.0 g/dL 12.6 12.9 13.8  Hematocrit 36 - 46 % 37.1 37.7 41.6  Platelets 150 - 400 K/uL 530(H) 519(H) 603(H)    CBG: Recent Labs  Lab 08/03/19 1125 08/03/19 1619 08/03/19 2101 08/04/19 0619 08/04/19 0753  GLUCAP 158* 155* 181* 245* 270*    Brief HPI:   Brittany [name] is a 59 y.o. female with history of T2DM, depression, discoid lupus, tobacco use who was admitted to [facility] on 07/12/2019 with 3-day history of N/V and dizziness.  She was found to have DKA with blood sugar 4 6 and was treated with IV insulin and IV fluid.  MRI brain revealed acute infarct in the inferior left cerebellum with mass-effect and minor petechial hemorrhage.  2D echo with bubble study showed EF of 55 to 60% and was negative for shunt.  Stroke was felt to be due to small vessel disease and ASA was recommended by Dr. Doy Mince.  She has continued to have issues with headaches and  was limited by ataxia, dizziness, tachycardia as well as weakness affecting mobility and ADLs.  CIR was recommended due to functional decline   Hospital Course: Brittany [name] was admitted to rehab 07/20/2019 for inpatient therapies to consist of PT, ST and OT at least three hours five days a week. Past admission physiatrist, therapy team and rehab RN have worked together to provide customized collaborative inpatient rehab. Blood pressures were monitored on TID basis and Lotensin was decreased to avoid hypotension.  Low-dose Coreg was added to help manage tachycardia. K pad's as well as muscle relaxers and muscle balm were used to help with chronic neck pain with radiculopathy.  Plaquenil and folic acid was resumed past discharge.  Topamax was initially added to help with headaches but was ineffective therefore discontinued. Gabapentin was increased to home dose of 600 mg/hs.  Her diabetes has been monitored with ac/hs CBG checks and SSI was use prn for tighter BS control.  Her blood sugars have been very labile and insulin has been adjusted multiple times. Nutritional supplements were also offered to help improve intake.  Her diet was liberalized to regular to help with food choices as patient did not adhere/did not plan to restrictions at home and to prevent hypoglycemic episodes.  Family has also been providing meals from her favorite restaurants to help with intake.  Patient was advised to continue monitoring blood sugars on achs basis and follow-up with PCP for further adjustment in meds.  She has had issues with depressed mood and anxiety which has affected her participation in therapy.  Ego support has been provided by team. Psychiatry and neuropsychology are also consulted to help with support and for input on management.  Psychiatry recommended medication changes therefore Wellbutrin was weaned off and patient was started on Celexa as well as hydroxyzine for mood stabilization.  Depakote was added to help  with headaches and this was titrated to twice daily to also help with mood stabilization.  Patient has progressed to min assist to supervision level.  She will continue to receive further follow-up home health PT and OT by [facility] after discharge.    Rehab course: During patient's stay in rehab weekly team conferences were held to monitor patient's progress, set goals and discuss barriers to discharge. At admission, patient required mod assist with ADL tasks and min to mod assist with mobility.She  has had improvement in activity tolerance, balance, postural control as well as ability to compensate for deficits. She requires supervision for recall with use of memory strategies.  She requires min assist for complex problem-solving as well as high-level tasks.  Patient has refused to consistently work with cognitive tasks therefore goals not achieved. Family education was completed/reviewed regarding assistance needed at discharge.    Disposition: Home  Diet: Heart healthy/Carb Modified.   Special Instructions: 1. No driving or strenuous activity till cleared by MD.  2. Family to assist with cognitive tasks.  3. Monitor BS ac/hs.   Allergies as of 08/04/2019      Reactions   Cephalosporins Itching   Hydromorphone Itching   Keflin [cephalothin] Itching      Medication List    STOP taking these medications   buPROPion 300 MG 24 hr tablet Commonly known as: WELLBUTRIN XL     TAKE these medications   aspirin 81 MG chewable tablet Chew 1 tablet (81 mg total) by mouth daily.   atorvastatin 80 MG tablet Commonly known as: LIPITOR Take 1 tablet (80 mg total) by mouth Nightly.   benazepril 10 MG tablet Commonly known as: LOTENSIN Take 0.5 tablets (5 mg total) by mouth daily. What changed: how much to take   carvedilol 3.125 MG tablet Commonly known as: COREG Take 1 tablet (3.125 mg total) by mouth 2 (two) times daily with a meal.   citalopram 10 MG tablet Commonly  known as: CELEXA Take 1 tablet (10 mg total) by mouth daily.   divalproex 125 MG capsule Commonly known as: DEPAKOTE SPRINKLE Take 2 capsules (250 mg total) by mouth every 12 (twelve) hours. Notes to patient: For headaches and mood stabilization   folic acid 1 MG tablet Commonly known as: FOLVITE Take 1 mg by mouth daily.   gabapentin 600 MG tablet Commonly known as: NEURONTIN Take 1 tablet (600 mg total) by mouth at bedtime. What changed: how much to take   hydroxychloroquine 200 MG tablet Commonly known as: PLAQUENIL Take 200 mg by mouth daily.   hydrOXYzine 10 MG tablet Commonly known as: ATARAX/VISTARIL Take 10 mg by mouth 2 (two) times daily.   metFORMIN 1000 MG tablet Commonly known as: GLUCOPHAGE Take 1 tablet (1,000 mg total) by mouth daily with breakfast.   mirtazapine 15 MG tablet Commonly known as: REMERON Take 1 tablet (15 mg total) by mouth at bedtime.   multivitamin with minerals Tabs tablet Take 1  tablet by mouth daily.   Muscle Rub 10-15 % Crea Apply 1 application topically 2 (two) times daily as needed for muscle pain.   nicotine polacrilex 2 MG gum Commonly known as: NICORETTE Take 1 each (2 mg total) by mouth as needed for smoking cessation.   NovoLIN 70/30 (70-30) 100 UNIT/ML injection Generic drug: insulin NPH-regular Human Inject 28 Units into the skin 2 (two) times daily with a meal. What changed: how much to take   Pancrelipase (Lip-Prot-Amyl) 24000-76000 units Cpep Take 3 capsules by mouth 3 (three) times daily. Notes to patient: Home medications--may resume as needed   rOPINIRole 1 MG tablet Commonly known as: REQUIP Take 1 tablet (1 mg total) by mouth 2 (two) times daily. What changed:   medication strength  how much to take  when to take this       [facility]. Call on 08/05/2019.   Why: for post hospital follow up.   Contact information: [place]  [address] [phone]        Charlett Blake, MD Follow up.   Specialty: Physical Medicine and Rehabilitation Why: call as needed Contact information: [place] [address] [phone]        [facility] Follow up on 08/05/2019.   Why: Call for stroke follow up Contact information: [place] [phone]              Signed: Bary Leriche 08/06/2019, 5:09 PM

## 2019-08-04 NOTE — Progress Notes (Addendum)
Inpatient Rehabilitation Care Coordinator  Discharge Note  The overall goal for the admission was met for:   Discharge location: Yes, Home  Length of Stay: Yes, 15 Days  Discharge activity level: Yes, Supervision  Home/community participation: Yes  Services provided included: MD, RD, PT, OT, SLP, RN, CM, TR, Pharmacy, [place]: Medicare  Follow-up services arranged: [facility]  Comments (or additional information):  Patient/Family verbalized understanding of follow-up arrangements: Yes  Individual responsible for coordination of the follow-up plan: self, [phone]  Confirmed correct DME delivered: Dyanne Iha 08/04/2019    Dyanne Iha

## 2019-08-04 NOTE — Discharge Instructions (Signed)
Inpatient Rehab Discharge Instructions  Brittany [name] Discharge date and time: 08/04/19   Activities/Precautions/ Functional Status: Activity: no lifting, driving, or strenuous exercise till cleared by Neurology/MD Diet: cardiac diet and diabetic diet Wound Care: none needed   Functional status:  ___ No restrictions     ___ Walk up steps independently _X__ 24/7 supervision/assistance   ___ Walk up steps with assistance ___ Intermittent supervision/assistance  ___ Bathe/dress independently ___ Walk with walker     ___ Bathe/dress with assistance ___ Walk Independently    ___ Shower independently _X__ Walk with supervision     _X__ Shower with assistance ___ No alcohol     ___ Return to work/school ________   Special Instructions: 1. Need to check blood sugars before meals and at bedtime. Call your primary MD for follow up and further adjustment of insulin.   COMMUNITY REFERRALS UPON DISCHARGE:    Home Health:   PT     OT                   Agency: [facility] Phone: [phone]    Medical Equipment/Items Ordered: Metallurgist chair                                                 Agency/Supplier: [employer]    STROKE/TIA DISCHARGE INSTRUCTIONS SMOKING Cigarette smoking nearly doubles your risk of having a stroke & is the single most alterable risk factor  If you smoke or have smoked in the last 12 months, you are advised to quit smoking for your health.  Most of the excess cardiovascular risk related to smoking disappears within a year of stopping.  Ask you doctor about anti-smoking medications  [place] Quit Line: 1-800-QUIT NOW  Free Smoking Cessation Classes [phone]  CHOLESTEROL Know your levels; limit fat & cholesterol in your diet  Lipid Panel     Component Value Date/Time   CHOL 200 07/14/2019 0632   TRIG 72 07/14/2019 0632   HDL 33 (L) 07/14/2019 0632   CHOLHDL 6.1 07/14/2019 0632   VLDL 14 07/14/2019 0632   LDLCALC 153 (H)  07/14/2019 7619      Many patients benefit from treatment even if their cholesterol is at goal.  Goal: Total Cholesterol (CHOL) less than 160  Goal:  Triglycerides (TRIG) less than 150  Goal:  HDL greater than 40  Goal:  LDL (LDLCALC) less than 100   BLOOD PRESSURE American Stroke Association blood pressure target is less that 120/80 mm/Hg  Your discharge blood pressure is:  BP: (!) 130/95  Monitor your blood pressure  Limit your salt and alcohol intake  Many individuals will require more than one medication for high blood pressure  DIABETES (A1c is a blood sugar average for last 3 months) Goal HGBA1c is under 7% (HBGA1c is blood sugar average for last 3 months)  Diabetes:     Lab Results  Component Value Date   HGBA1C 13.5 (H) 07/13/2019     Your HGBA1c can be lowered with medications, healthy diet, and exercise.  Check your blood sugar as directed by your physician  Call your physician if you experience unexplained or low blood sugars.  PHYSICAL ACTIVITY/REHABILITATION Goal is 30 minutes at least 4 days per week  Activity: No driving, Therapies: see above Return to work: N/A  Activity  decreases your risk of heart attack and stroke and makes your heart stronger.  It helps control your weight and blood pressure; helps you relax and can improve your mood.  Participate in a regular exercise program.  Talk with your doctor about the best form of exercise for you (dancing, walking, swimming, cycling).  DIET/WEIGHT Goal is to maintain a healthy weight  Your discharge diet is:  Diet Order            Diet Diabetic  Room service appropriate? Yes; Fluid consistency: Thin  Diet effective now               liquids Your height is:  5' Your current weight is: 47.3 kg Your Body Mass Index (BMI) is:    Following the type of diet specifically designed for you will help prevent another stroke.  You are at goal weight      Your goal Body Mass Index (BMI) is  19-24.  Healthy food habits can help reduce 3 risk factors for stroke:  High cholesterol, hypertension, and excess weight.  RESOURCES Stroke/Support Group:  Call [phone]   STROKE EDUCATION PROVIDED/REVIEWED AND GIVEN TO PATIENT Stroke warning signs and symptoms How to activate emergency medical system (call 911). Medications prescribed at discharge. Need for follow-up after discharge. Personal risk factors for stroke. Pneumonia vaccine given:  Flu vaccine given:  My questions have been answered, the writing is legible, and I understand these instructions.  I will adhere to these goals & educational materials that have been provided to me after my discharge from the hospital.       My questions have been answered and I understand these instructions. I will adhere to these goals and the provided educational materials after my discharge from the hospital.  Patient/Caregiver Signature _______________________________ Date __________  Clinician Signature _______________________________________ Date __________  Please bring this form and your medication list with you to all your follow-up doctor's appointments.   Carbohydrate Counting For People With Diabetes  Foods with carbohydrates make your blood glucose level go up. Learning how to count carbohydrates can help you control your blood glucose levels. First, identify the foods you eat that contain carbohydrates. Then, using the Foods with Carbohydrates chart, determine about how much carbohydrates are in your meals and snacks. Make sure you are eating foods with fiber, protein, and healthy fat along with your carbohydrate foods.  Foods with Carbohydrates The following table shows carbohydrate foods that have about 15 grams of carbohydrate each. Using measuring cups, spoons, or a food scale when you first begin learning about carbohydrate counting can help you learn about the portion sizes you typically eat.  The following foods have  15 grams carbohydrate each:  Grains . 1 slice bread (1 ounce)  . 1 small tortilla (6-inch size)  .  large bagel (1 ounce)  . 1/3 cup pasta or rice (cooked)  .  hamburger or hot dog bun ( ounce)  .  cup cooked cereal  .  to  cup ready-to-eat cereal  . 2 taco shells (5-inch size) Fruit . 1 small fresh fruit ( to 1 cup)  .  medium banana  . 17 small grapes (3 ounces)  . 1 cup melon or berries  .  cup canned or frozen fruit  . 2 tablespoons dried fruit (blueberries, cherries, cranberries, raisins)  .  cup unsweetened fruit juice  Starchy Vegetables .  cup cooked beans, peas, corn, potatoes/sweet potatoes  .  large baked potato (3 ounces)  .  1 cup acorn or butternut squash  Snack Foods . 3 to 6 crackers  . 8 potato chips or 13 tortilla chips ( ounce to 1 ounce)  . 3 cups popped popcorn  Dairy . 3/4 cup (6 ounces) nonfat plain yogurt, or yogurt with sugar-free sweetener  . 1 cup milk  . 1 cup plain rice, soy, coconut or flavored almond milk Sweets and Desserts .  cup ice cream or frozen yogurt  . 1 tablespoon jam, jelly, pancake syrup, table sugar, or honey  . 2 tablespoons light pancake syrup  . 1 inch square of frosted cake or 2 inch square of unfrosted cake  . 2 small cookies (2/3 ounce each) or  large cookie   Sometimes you'll have to estimate carbohydrate amounts if you don't know the exact recipe. One cup of mixed foods like soups can have 1 to 2 carbohydrate servings, while some casseroles might have 2 or more servings of carbohydrate. Foods that have less than 20 calories in each serving can be counted as "free" foods. Count 1 cup raw vegetables, or  cup cooked non-starchy vegetables as "free" foods. If you eat 3 or more servings at one meal, then count them as 1 carbohydrate serving.   Foods without Carbohydrates  Not all foods contain carbohydrates. Meat, some dairy, fats, non-starchy vegetables, and many beverages don't contain carbohydrate. So when you  count carbohydrates, you can generally exclude chicken, pork, beef, fish, seafood, eggs, tofu, cheese, butter, sour cream, avocado, nuts, seeds, olives, mayonnaise, water, black coffee, unsweetened tea, and zero-calorie drinks. Vegetables with no or low carbohydrate include green beans, cauliflower, tomatoes, and onions.  How much carbohydrate should I eat at each meal?  Carbohydrate counting can help you plan your meals and manage your weight. Following are some starting points for carbohydrate intake at each meal. Work with your registered dietitian nutritionist to find the best range that works for your blood glucose and weight.    To Lose Weight To Maintain Weight  Women 2 - 3 carb servings 3 - 4 carb servings  Men 3 - 4 carb servings 4 - 5 carb servings   Checking your blood glucose after meals will help you know if you need to adjust the timing, type, or number of carbohydrate servings in your meal plan. Achieve and keep a healthy body weight by balancing your food intake and physical activity.  Tips:  How should I plan my meals?  Plan for half the food on your plate to include non-starchy vegetables, like salad greens, broccoli, or carrots. Try to eat 3 to 5 servings of non-starchy vegetables every day. Have a protein food at each meal. Protein foods include chicken, fish, meat, eggs, or beans (note that beans contain carbohydrate). These two food groups (non-starchy vegetables and proteins) are low in carbohydrate. If you fill up your plate with these foods, you will eat less carbohydrate but still fill up your stomach. Try to limit your carbohydrate portion to  of the plate.   What fats are healthiest to eat?  Diabetes increases risk for heart disease. To help protect your heart, eat more healthy fats, such as olive oil, nuts, and avocado. Eat less saturated fats like butter, cream, and high-fat meats, like bacon and sausage. Avoid trans fats, which are in all foods that list "partially  hydrogenated oil" as an ingredient.  What should I drink?  Choose drinks that are not sweetened with sugar. The healthiest choices are water, carbonated or seltzer waters,  and tea and coffee without added sugars.  Sweet drinks will make your blood glucose go up very quickly. One serving of soda or energy drink is  cup. It is best to drink these beverages only if your blood glucose is low.  Artificially sweetened, or diet drinks, typically do not increase your blood glucose if they have zero calories in them. Read labels of beverages, as some diet drinks do have carbohydrate and will raise your blood glucose.  Label Reading Tips Read Nutrition Facts labels to find out how many grams of carbohydrate are in a food you want to eat. Don't forget: sometimes serving sizes on the label aren't the same as how much food you are going to eat, so you may need to calculate how much carbohydrate is in the food you are serving yourself.   Carbohydrate Counting for People with Diabetes Sample 1-Day Menu  Breakfast  cup yogurt, low fat, low sugar (1 carbohydrate serving)   cup cereal, ready-to-eat, unsweetened (1 carbohydrate serving)  1 cup strawberries (1 carbohydrate serving)   cup almonds ( carbohydrate serving)  Lunch 1, 5 ounce can chunk light tuna  2 ounces cheese, low fat cheddar  6 whole wheat crackers (1 carbohydrate serving)  1 small apple (1 carbohydrate servings)   cup carrots ( carbohydrate serving)   cup snap peas  1 cup 1% milk (1 carbohydrate serving)   Evening Meal Stir fry made with: 3 ounces chicken  1 cup brown rice (3 carbohydrate servings)   cup broccoli ( carbohydrate serving)   cup green beans   cup onions  1 tablespoon olive oil  2 tablespoons teriyaki sauce ( carbohydrate serving)  Evening Snack 1 extra small banana (1 carbohydrate serving)  1 tablespoon peanut butter   Carbohydrate Counting for People with Diabetes Vegan Sample 1-Day Menu  Breakfast 1 cup  cooked oatmeal (2 carbohydrate servings)   cup blueberries (1 carbohydrate serving)  2 tablespoons flaxseeds  1 cup soymilk fortified with calcium and vitamin D  1 cup coffee  Lunch 2 slices whole wheat bread (2 carbohydrate servings)   cup baked tofu   cup lettuce  2 slices tomato  2 slices avocado   cup baby carrots ( carbohydrate serving)  1 orange (1 carbohydrate serving)  1 cup soymilk fortified with calcium and vitamin D   Evening Meal Burrito made with: 1 6-inch corn tortilla (1 carbohydrate serving)  1 cup refried vegetarian beans (2 carbohydrate servings)   cup chopped tomatoes   cup lettuce   cup salsa  1/3 cup brown rice (1 carbohydrate serving)  1 tablespoon olive oil for rice   cup zucchini   Evening Snack 6 small whole grain crackers (1 carbohydrate serving)  2 apricots ( carbohydrate serving)   cup unsalted peanuts ( carbohydrate serving)    Carbohydrate Counting for People with Diabetes Vegetarian (Lacto-Ovo) Sample 1-Day Menu  Breakfast 1 cup cooked oatmeal (2 carbohydrate servings)   cup blueberries (1 carbohydrate serving)  2 tablespoons flaxseeds  1 egg  1 cup 1% milk (1 carbohydrate serving)  1 cup coffee  Lunch 2 slices whole wheat bread (2 carbohydrate servings)  2 ounces low-fat cheese   cup lettuce  2 slices tomato  2 slices avocado   cup baby carrots ( carbohydrate serving)  1 orange (1 carbohydrate serving)  1 cup unsweetened tea  Evening Meal Burrito made with: 1 6-inch corn tortilla (1 carbohydrate serving)   cup refried vegetarian beans (1 carbohydrate serving)   cup  tomatoes   cup lettuce   cup salsa  1/3 cup brown rice (1 carbohydrate serving)  1 tablespoon olive oil for rice   cup zucchini  1 cup 1% milk (1 carbohydrate serving)  Evening Snack 6 small whole grain crackers (1 carbohydrate serving)  2 apricots ( carbohydrate serving)   cup unsalted peanuts ( carbohydrate serving)    Copyright 2020  Academy of  Nutrition and Dietetics. All rights reserved.    Using Nutrition Labels: Carbohydrate  . Serving Size  . Look at the serving size. All the information on the label is based on this portion. Randol Kern Per Container  . The number of servings contained in the package. . Guidelines for Carbohydrate  . Look at the total grams of carbohydrate in the serving size.  . 1 carbohydrate choice = 15 grams of carbohydrate.  Range of Carbohydrate Grams Per Choice  Carbohydrate Grams/Choice Carbohydrate Choices  6-10   11-20 1  21-25 1  26-35 2  36-40 2  41-50 3  51-55 3  56-65 4  66-70 4  71-80 5    Copyright 2020  Academy of Nutrition and Dietetics. All rights reserved.

## 2019-08-04 NOTE — Progress Notes (Signed)
Brittany [name] PHYSICAL MEDICINE & REHABILITATION PROGRESS NOTE   Subjective/Complaints: Pt without new issues overnite reviewed CBG  Has appt at [facility] for earwax removal   ROS: Patient denies N/V/D , no CP or SOB    Objective:   No results found. Recent Labs    08/03/19 0626  WBC 8.5  HGB 12.6  HCT 37.1  PLT 530*   Recent Labs    08/03/19 0626  NA 136  K 3.9  CL 104  CO2 21*  GLUCOSE 260*  BUN 16  CREATININE 0.74  CALCIUM 9.3    Intake/Output Summary (Last 24 hours) at 08/04/2019 0925 Last data filed at 08/04/2019 6378 Gross per 24 hour  Intake 1180 ml  Output --  Net 1180 ml     Physical Exam: Vital Signs Blood pressure (!) 130/95, pulse 98, temperature 98.1 F (36.7 C), temperature source Oral, resp. rate 18, SpO2 95 %.  General: No acute distress Mood and affect are appropriate Heart: Regular rate and rhythm no rubs murmurs or extra sounds Lungs: Clear to auscultation, breathing unlabored, no rales or wheezes Abdomen: Positive bowel sounds, soft nontender to palpation, nondistended Extremities: No clubbing, cyanosis, or edema Skin: No evidence of breakdown, no evidence of rash   Neurologic: Cranial nerves II through XII intact, motor strength is 5/5 in bilateral deltoid, bicep, tricep, grip, hip flexor, knee extensors, ankle dorsiflexor and plantar flexor Sensation intact to LT both feet.  Cerebellar mild dysmetria Left FNF  Musculoskeletal: Full range of motion in all 4 extremities. No joint swelling    Assessment/Plan: 1. 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 Stable for D/C today F/u PCP in 3-4 weeks F/u Neuro1-2 mo See D/C summary See D/C instructions  Care Tool:  Bathing    Body parts bathed by patient: Right arm, Abdomen, Chest, Left arm, Front perineal area, Left upper leg, Face, Right upper leg, Right lower leg, Left lower leg, Buttocks   Body parts bathed by helper: Buttocks     Bathing assist Assist Level:  Supervision/Verbal cueing (per most recent staff documentation)     Upper Body Dressing/Undressing Upper body dressing   What is the patient wearing?: Pull over shirt, Bra    Upper body assist Assist Level: Set up assist (per most recent staff documentation)    Lower Body Dressing/Undressing Lower body dressing      What is the patient wearing?: Pants, Underwear/pull up     Lower body assist Assist for lower body dressing: Supervision/Verbal cueing (per most recent staff documentation)     Toileting Toileting    Toileting assist Assist for toileting: Supervision/Verbal cueing (per most recent staff documentation)     Transfers Chair/bed transfer  Transfers assist     Chair/bed transfer assist level: Supervision/Verbal cueing     Locomotion Ambulation   Ambulation assist      Assist level: Supervision/Verbal cueing Assistive device: Walker-rolling Max distance: 271ft   Walk 10 feet activity   Assist     Assist level: Supervision/Verbal cueing Assistive device: Hand held assist   Walk 50 feet activity   Assist Walk 50 feet with 2 turns activity did not occur: Safety/medical concerns  Assist level: Supervision/Verbal cueing Assistive device: Walker-rolling    Walk 150 feet activity   Assist Walk 150 feet activity did not occur: Safety/medical concerns  Assist level: Supervision/Verbal cueing Assistive device: Walker-rolling    Walk 10 feet on uneven surface  activity   Assist Walk 10 feet on uneven surfaces activity did  not occur: Refused   Assist level: Moderate Assistance - Patient - 50 - 74% Assistive device: Aeronautical engineer Will patient use wheelchair at discharge?: No             Wheelchair 50 feet with 2 turns activity    Assist            Wheelchair 150 feet activity     Assist          Blood pressure (!) 130/95, pulse 98, temperature 98.1 F (36.7 C), temperature source  Oral, resp. rate 18, SpO2 95 %.  Medical Problem List and Plan: 1.Functional and mobility deficitssecondary to left cerebellar infarct- minimal stroke deficits Continue CIR PT, OT, SLP  -ELOS/Goals: tent D/C 6/22,  2. Antithrombotics: -DVT/anticoagulation:Pharmaceutical:Ambulating >500 feet- will d/c Lovenox -antiplatelet therapy: On low dose ASA 3.C6/C7 radiculopathy/Pain Management:s/p ESI- 3/23.Pt with notable multifactorial spondylosis in mid to lower cervical spine -add heating pad.  -utilize muscle relaxant, Muscle balm - therapy addressing posture, ROM -Willalsocontinue Hydrocodone prn.Has been helping with headache.   On high dose gabpentin at night for presumed neuropathic pain but no clinical signs of a severe neuropathy this was stopped.    -wean off Topirimate for HA no improvement + potential interaction with metformin 4. Mood:LCSW to follow for evaluation and support.- hx depression with lability ask psych to eval - pt ok with this  -antipsychotic agents: N/A  -anxiety is a barrier to participation   -add low dose xanax prn in addition to atarax   Emotional lability- states she was seen by [facility] psych (did not help) could not give details on Psych diagnosis,[place] Helen advised starting Depakote 250mg  BID  5. Neuropsych: This patientiscapable of making decisions on herown behalf. 6. Skin/Wound Care:Routine pressure relief measures. 7. Fluids/Electrolytes/Nutrition:Monitor I/O. Check lytes in am. Low BMI with moderate protein malnutrition  Seen by dietician as OP , poor compliance 8. HTN: Monitor BP tid--on Lisinoprilwith BP still poorly controlled. SBP >100's. Will likely need titration of meds. Vitals:   08/03/19 2113 08/04/19 0448  BP: 118/81 (!) 130/95  Pulse: (!) 108 98  Resp: 18 18  Temp: 98.3 F (36.8 C) 98.1 F (36.7 C)  SpO2: 100% 95%   controlled on Lotensin 6/15, will reduce and start low dose coreg for tachy  Hypotensive and tachycardic- continue to monitor 9. T2DM post pancreatectomy: Poorly controlled with Hgb A1C- 12.5. Monitor BS ac/hs--poor controlled. Resume 70/30 insulin. Was not usingmetforminPTA.Will d/c Glucerna between meals as likely affecting BS and change to Ensure Max which has 5g net carbs.  CBG (last 3)  Recent Labs    08/03/19 2101 08/04/19 0619 08/04/19 0753  GLUCAP 181* 245* 270*  Adjusted 70/30 dose  -6/18 controlled this am will d/c SSI given the labile CBGs, encourage pt to eat at least 50% meals  6/21 Improving will reduce CBG to ac hs, increase metformin to 1000mg   qam , home dose 10 Collagen Vascular disease/discoid Lupus: Resume Plaquenil and folic acid.  11. Pancreatic insufficiency/S/p Pancreatectomy: Resume pancrease tid ac. Will resume psyllium and loperemide prn for chronic diarrhea.  12. Dyslipidemia: On high dose statin.  13. Dizziness:Continue low dose antivert. May need vestibular evaluation. 58. H/o Major depressive disorder: Continue Wellbutrin--resume atarax bidprn(for anxiety). Resumed  Remeron.  -add prn xanax as above 15.Tobacco abuse: Willing to try nicotine gum--will order. 16. Vitamin B 12 deficiency: Gets IM injection every month.  17.  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  Insomnia multifactorial-added ropirinole for restless legs. Increased dose to 1mg  BID.  LOS: 15 days A FACE TO FACE EVALUATION WAS PERFORMED  Charlett Blake 08/04/2019, 9:25 AM

## 2019-08-04 NOTE — Progress Notes (Signed)
Provided discharge instructions to pt, pt able to verbalize what medications she is taking, talk about changes to the medications and follow up appts. Pt discharged home with personal properly .

## 2019-08-10 ENCOUNTER — Telehealth: Payer: Self-pay

## 2019-08-10 NOTE — Telephone Encounter (Signed)
Home Health has contacted Brittany [name] for home visits. Patient has not scheduled a visit because she is not feeling well.   I called [name] and advised her that it is important to make and keep home visits. She stated she will call next week for a [place] visit. "I was just having a headache at the time. But I am alright."

## 2019-10-15 ENCOUNTER — Emergency Department
Admission: EM | Admit: 2019-10-15 | Discharge: 2019-10-15 | Discharge status: Absent without leave | Payer: Medicare Other

## 2019-11-02 ENCOUNTER — Emergency Department: Payer: Medicare Other

## 2019-11-02 ENCOUNTER — Encounter: Payer: Self-pay | Admitting: Emergency Medicine

## 2019-11-02 ENCOUNTER — Emergency Department
Admission: EM | Admit: 2019-11-02 | Discharge: 2019-11-02 | Disposition: A | Discharge status: Home / Self Care | Payer: Medicare Other | Attending: Emergency Medicine | Admitting: Emergency Medicine

## 2019-11-02 ENCOUNTER — Other Ambulatory Visit: Payer: Self-pay

## 2019-11-02 DIAGNOSIS — Z794 Long term (current) use of insulin: Secondary | ICD-10-CM | POA: Insufficient documentation

## 2019-11-02 DIAGNOSIS — I10 Essential (primary) hypertension: Secondary | ICD-10-CM | POA: Insufficient documentation

## 2019-11-02 DIAGNOSIS — F17200 Nicotine dependence, unspecified, uncomplicated: Secondary | ICD-10-CM | POA: Diagnosis not present

## 2019-11-02 DIAGNOSIS — R070 Pain in throat: Secondary | ICD-10-CM | POA: Insufficient documentation

## 2019-11-02 DIAGNOSIS — R531 Weakness: Secondary | ICD-10-CM | POA: Diagnosis not present

## 2019-11-02 DIAGNOSIS — Z20822 Contact with and (suspected) exposure to covid-19: Secondary | ICD-10-CM | POA: Insufficient documentation

## 2019-11-02 DIAGNOSIS — R519 Headache, unspecified: Secondary | ICD-10-CM | POA: Insufficient documentation

## 2019-11-02 DIAGNOSIS — R5383 Other fatigue: Secondary | ICD-10-CM | POA: Insufficient documentation

## 2019-11-02 DIAGNOSIS — Z859 Personal history of malignant neoplasm, unspecified: Secondary | ICD-10-CM | POA: Diagnosis not present

## 2019-11-02 DIAGNOSIS — E1165 Type 2 diabetes mellitus with hyperglycemia: Secondary | ICD-10-CM

## 2019-11-02 DIAGNOSIS — J449 Chronic obstructive pulmonary disease, unspecified: Secondary | ICD-10-CM | POA: Insufficient documentation

## 2019-11-02 DIAGNOSIS — Z79899 Other long term (current) drug therapy: Secondary | ICD-10-CM | POA: Insufficient documentation

## 2019-11-02 DIAGNOSIS — Z8673 Personal history of transient ischemic attack (TIA), and cerebral infarction without residual deficits: Secondary | ICD-10-CM | POA: Diagnosis not present

## 2019-11-02 DIAGNOSIS — J029 Acute pharyngitis, unspecified: Secondary | ICD-10-CM

## 2019-11-02 DIAGNOSIS — R439 Unspecified disturbances of smell and taste: Secondary | ICD-10-CM | POA: Diagnosis not present

## 2019-11-02 DIAGNOSIS — Z7982 Long term (current) use of aspirin: Secondary | ICD-10-CM | POA: Insufficient documentation

## 2019-11-02 LAB — SARS CORONAVIRUS 2 BY RT PCR (HOSPITAL ORDER, PERFORMED IN ~~LOC~~ HOSPITAL LAB): SARS Coronavirus 2: NEGATIVE

## 2019-11-02 LAB — GLUCOSE, CAPILLARY
Glucose-Capillary: >600 mg/dL (ref 70–99)
Glucose-Capillary: 589 mg/dL (ref 70–99)
Glucose-Capillary: 403 mg/dL — ABNORMAL HIGH (ref 70–99)

## 2019-11-02 LAB — URINALYSIS, COMPLETE (UACMP) WITH MICROSCOPIC
Bilirubin Urine: NEGATIVE
Glucose, UA: >=500 mg/dL — AB
Hgb urine dipstick: NEGATIVE
Ketones, ur: 20 mg/dL — AB
Leukocytes,Ua: NEGATIVE
Nitrite: NEGATIVE
Protein, ur: NEGATIVE mg/dL
Specific Gravity, Urine: 1.03 (ref 1.005–1.030)
pH: 6 (ref 5.0–8.0)

## 2019-11-02 LAB — CBC WITH DIFFERENTIAL/PLATELET
Abs Immature Granulocytes: 0.03 10*3/uL (ref 0.00–0.07)
Basophils Absolute: 0 10*3/uL (ref 0.0–0.1)
Basophils Relative: 0 %
Eosinophils Absolute: 0 10*3/uL (ref 0.0–0.5)
Eosinophils Relative: 0 %
HCT: 44.7 % (ref 36.0–46.0)
Hemoglobin: 15.8 g/dL — ABNORMAL HIGH (ref 12.0–15.0)
Immature Granulocytes: 0 %
Lymphocytes Relative: 37 %
Lymphs Abs: 2.6 10*3/uL (ref 0.7–4.0)
MCH: 31.8 pg (ref 26.0–34.0)
MCHC: 35.3 g/dL (ref 30.0–36.0)
MCV: 89.9 fL (ref 80.0–100.0)
Monocytes Absolute: 0.4 10*3/uL (ref 0.1–1.0)
Monocytes Relative: 5 %
Neutro Abs: 3.9 10*3/uL (ref 1.7–7.7)
Neutrophils Relative %: 58 %
Platelets: 274 10*3/uL (ref 150–400)
RBC: 4.97 MIL/uL (ref 3.87–5.11)
RDW: 12 % (ref 11.5–15.5)
WBC: 6.8 10*3/uL (ref 4.0–10.5)
nRBC: 0 % (ref 0.0–0.2)

## 2019-11-02 LAB — COMPREHENSIVE METABOLIC PANEL
ALT: 14 U/L (ref 0–44)
AST: 12 U/L — ABNORMAL LOW (ref 15–41)
Albumin: 3.6 g/dL (ref 3.5–5.0)
Alkaline Phosphatase: 117 U/L (ref 38–126)
Anion gap: 15 (ref 5–15)
BUN: 10 mg/dL (ref 6–20)
CO2: 23 mmol/L (ref 22–32)
Calcium: 9.2 mg/dL (ref 8.9–10.3)
Chloride: 86 mmol/L — ABNORMAL LOW (ref 98–111)
Creatinine, Ser: 0.78 mg/dL (ref 0.44–1.00)
GFR calc Af Amer: >60 mL/min (ref >60)
GFR calc non Af Amer: >60 mL/min (ref >60)
Glucose, Bld: 622 mg/dL (ref 70–99)
Potassium: 3 mmol/L — ABNORMAL LOW (ref 3.5–5.1)
Sodium: 124 mmol/L — ABNORMAL LOW (ref 135–145)
Total Bilirubin: 1.2 mg/dL (ref 0.3–1.2)
Total Protein: 7.7 g/dL (ref 6.5–8.1)

## 2019-11-02 MED ORDER — POTASSIUM CHLORIDE IN NACL 20-0.9 MEQ/L-% IV SOLN
Freq: Once | INTRAVENOUS | Status: AC
  Filled 2019-11-02: qty 1000

## 2019-11-02 MED ORDER — INSULIN GLARGINE 100 UNIT/ML ~~LOC~~ SOLN
10.0000 [IU] | Freq: Once | SUBCUTANEOUS | Status: AC
  Administered 2019-11-02: 10 [IU] via SUBCUTANEOUS
  Filled 2019-11-02: qty 0.1

## 2019-11-02 MED ORDER — LACTATED RINGERS IV BOLUS
1000.0000 mL | Freq: Once | INTRAVENOUS | Status: AC
  Administered 2019-11-02: 1000 mL via INTRAVENOUS

## 2019-11-02 MED ORDER — SODIUM CHLORIDE 0.9 % IV BOLUS
1000.0000 mL | Freq: Once | INTRAVENOUS | Status: AC
  Administered 2019-11-02: 1000 mL via INTRAVENOUS

## 2019-11-02 NOTE — ED Triage Notes (Signed)
Pt has not felt well for one week.  C/o fatigue, muscle pains, loss of taste/smell.  Having decreased appetite.  Has had some vomiting over last week.  Sugar critical high in triage.  Has not been checking blood sugars.

## 2019-11-02 NOTE — ED Notes (Signed)
Lab called to collect blood. Pt appears dry and unable to obtain labs from IV

## 2019-11-02 NOTE — ED Notes (Signed)
Discussed with dr Charna Archer, no further orders other than what has been placed at this time.

## 2019-11-02 NOTE — ED Notes (Signed)
Date and time results received: 11/02/19 1945 (use smartphrase ".now" to insert current time)  Test: Glucose Critical Value: 622  Name of Provider Notified: Dr. Cheri Fowler  Orders Received? Or Actions Taken?: Orders Received - See Orders for details
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
## 2019-11-20 ENCOUNTER — Other Ambulatory Visit: Payer: Self-pay | Admitting: Neurology

## 2019-11-20 DIAGNOSIS — M5412 Radiculopathy, cervical region: Secondary | ICD-10-CM

## 2019-12-04 ENCOUNTER — Other Ambulatory Visit: Payer: Self-pay

## 2019-12-04 ENCOUNTER — Ambulatory Visit
Admission: RE | Admit: 2019-12-04 | Discharge: 2019-12-04 | Disposition: A | Discharge status: Home / Self Care | Payer: Medicare Other | Source: Ambulatory Visit | Attending: Neurology | Admitting: Neurology

## 2019-12-04 DIAGNOSIS — M5412 Radiculopathy, cervical region: Secondary | ICD-10-CM | POA: Insufficient documentation

## 2020-03-13 ENCOUNTER — Encounter: Payer: Self-pay | Admitting: Emergency Medicine

## 2020-03-13 ENCOUNTER — Other Ambulatory Visit: Payer: Self-pay

## 2020-03-13 ENCOUNTER — Emergency Department: Payer: Medicare Other

## 2020-03-13 DIAGNOSIS — R112 Nausea with vomiting, unspecified: Secondary | ICD-10-CM | POA: Diagnosis not present

## 2020-03-13 DIAGNOSIS — R197 Diarrhea, unspecified: Secondary | ICD-10-CM | POA: Diagnosis not present

## 2020-03-13 DIAGNOSIS — Z7984 Long term (current) use of oral hypoglycemic drugs: Secondary | ICD-10-CM | POA: Insufficient documentation

## 2020-03-13 DIAGNOSIS — Z79899 Other long term (current) drug therapy: Secondary | ICD-10-CM | POA: Insufficient documentation

## 2020-03-13 DIAGNOSIS — E1165 Type 2 diabetes mellitus with hyperglycemia: Secondary | ICD-10-CM | POA: Diagnosis not present

## 2020-03-13 DIAGNOSIS — Z7982 Long term (current) use of aspirin: Secondary | ICD-10-CM | POA: Insufficient documentation

## 2020-03-13 DIAGNOSIS — I10 Essential (primary) hypertension: Secondary | ICD-10-CM | POA: Insufficient documentation

## 2020-03-13 DIAGNOSIS — F17200 Nicotine dependence, unspecified, uncomplicated: Secondary | ICD-10-CM | POA: Insufficient documentation

## 2020-03-13 DIAGNOSIS — J449 Chronic obstructive pulmonary disease, unspecified: Secondary | ICD-10-CM | POA: Insufficient documentation

## 2020-03-13 DIAGNOSIS — E860 Dehydration: Secondary | ICD-10-CM | POA: Diagnosis not present

## 2020-03-13 DIAGNOSIS — Z85828 Personal history of other malignant neoplasm of skin: Secondary | ICD-10-CM | POA: Diagnosis not present

## 2020-03-13 DIAGNOSIS — R519 Headache, unspecified: Secondary | ICD-10-CM | POA: Insufficient documentation

## 2020-03-13 DIAGNOSIS — Z20822 Contact with and (suspected) exposure to covid-19: Secondary | ICD-10-CM | POA: Diagnosis not present

## 2020-03-13 DIAGNOSIS — Z794 Long term (current) use of insulin: Secondary | ICD-10-CM | POA: Insufficient documentation

## 2020-03-13 LAB — COMPREHENSIVE METABOLIC PANEL
ALT: 17 U/L (ref 0–44)
AST: 19 U/L (ref 15–41)
Albumin: 3.6 g/dL (ref 3.5–5.0)
Alkaline Phosphatase: 81 U/L (ref 38–126)
Anion gap: 17 — ABNORMAL HIGH (ref 5–15)
BUN: 19 mg/dL (ref 6–20)
CO2: 26 mmol/L (ref 22–32)
Calcium: 9 mg/dL (ref 8.9–10.3)
Chloride: 90 mmol/L — ABNORMAL LOW (ref 98–111)
Creatinine, Ser: 0.79 mg/dL (ref 0.44–1.00)
GFR, Estimated: >60 mL/min (ref >60)
Glucose, Bld: 409 mg/dL — ABNORMAL HIGH (ref 70–99)
Potassium: 3.4 mmol/L — ABNORMAL LOW (ref 3.5–5.1)
Sodium: 133 mmol/L — ABNORMAL LOW (ref 135–145)
Total Bilirubin: 1.2 mg/dL (ref 0.3–1.2)
Total Protein: 8.4 g/dL — ABNORMAL HIGH (ref 6.5–8.1)

## 2020-03-13 LAB — CBC
HCT: 45.9 % (ref 36.0–46.0)
Hemoglobin: 15.4 g/dL — ABNORMAL HIGH (ref 12.0–15.0)
MCH: 31.2 pg (ref 26.0–34.0)
MCHC: 33.6 g/dL (ref 30.0–36.0)
MCV: 92.9 fL (ref 80.0–100.0)
Platelets: 427 10*3/uL — ABNORMAL HIGH (ref 150–400)
RBC: 4.94 MIL/uL (ref 3.87–5.11)
RDW: 12.5 % (ref 11.5–15.5)
WBC: 7.3 10*3/uL (ref 4.0–10.5)
nRBC: 0 % (ref 0.0–0.2)

## 2020-03-13 LAB — LIPASE, BLOOD: Lipase: 48 U/L (ref 11–51)

## 2020-03-13 NOTE — ED Triage Notes (Signed)
Pt to ED via POV with c/o emesis since Friday and R sided pain. Pt states R sided HA and numbness/cold to R head down her R arm. Pt states hx of a stroke in May.   Pt noted to be tearful on arrival to triage.   Pt states HA has never subsided since she had the stroke back in May.

## 2020-03-14 ENCOUNTER — Emergency Department
Admission: EM | Admit: 2020-03-14 | Discharge: 2020-03-14 | Disposition: A | Discharge status: Home / Self Care | Payer: Medicare Other | Attending: Emergency Medicine | Admitting: Emergency Medicine

## 2020-03-14 DIAGNOSIS — Z794 Long term (current) use of insulin: Secondary | ICD-10-CM

## 2020-03-14 DIAGNOSIS — R197 Diarrhea, unspecified: Secondary | ICD-10-CM

## 2020-03-14 DIAGNOSIS — E1165 Type 2 diabetes mellitus with hyperglycemia: Secondary | ICD-10-CM

## 2020-03-14 DIAGNOSIS — E860 Dehydration: Secondary | ICD-10-CM

## 2020-03-14 DIAGNOSIS — R112 Nausea with vomiting, unspecified: Secondary | ICD-10-CM

## 2020-03-14 LAB — POC SARS CORONAVIRUS 2 AG -  ED: SARS Coronavirus 2 Ag: NEGATIVE

## 2020-03-14 LAB — CBG MONITORING, ED: Glucose-Capillary: 330 mg/dL — ABNORMAL HIGH (ref 70–99)

## 2020-03-14 LAB — SARS CORONAVIRUS 2 (TAT 6-24 HRS): SARS Coronavirus 2: NEGATIVE

## 2020-03-14 MED ORDER — LACTATED RINGERS IV BOLUS
1000.0000 mL | Freq: Once | INTRAVENOUS | Status: AC
  Administered 2020-03-14: 1000 mL via INTRAVENOUS

## 2020-03-14 MED ORDER — ACETAMINOPHEN 500 MG PO TABS
1000.0000 mg | ORAL_TABLET | Freq: Once | ORAL | Status: AC
  Administered 2020-03-14: 1000 mg via ORAL
  Filled 2020-03-14: qty 2

## 2020-03-14 MED ORDER — ONDANSETRON 4 MG PO TBDP: 4.0000 mg | ORAL_TABLET | Freq: Three times a day (TID) | ORAL | 0 refills | Status: DC | PRN

## 2020-03-14 MED ORDER — ONDANSETRON HCL 4 MG/2ML IJ SOLN
4.0000 mg | Freq: Once | INTRAMUSCULAR | Status: AC
  Administered 2020-03-14: 4 mg via INTRAVENOUS
  Filled 2020-03-14: qty 2

## 2020-03-14 MED ORDER — TRAMADOL HCL 50 MG PO TABS: 50.0000 mg | ORAL_TABLET | Freq: Four times a day (QID) | ORAL | 0 refills | Status: DC | PRN

## 2020-03-14 NOTE — ED Notes (Signed)
Pt with IV fluid and blood on [facility] floor. RN cleaned pts hand and applied bandage. Bleeding controlled. Pt reports she didn't need it anymore and that all the blood had been drawn. Pt up to bathroom with stead gait.
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
## 2020-03-14 NOTE — ED Triage Notes (Signed)
Emergency Medicine Provider Triage Evaluation Note  Brittany [name] , a 60 y.o. female  was evaluated in triage.  Pt complains of nausea, vomiting and worsening generalized body aches and HA x 3 days. Diarrhea has resolved. Unable to keep anything down. No fever, cough, CP, SOB, abd pain, or dysuria. Body pains and HA have been constant since her stroke in May but she feels dehydrated. No exposure to Covid, vaccinated.  Review of Systems  Positive: HA, body aches, vomiting, diarrhea Negative: Cough, fever, cp, sob, abd pain  Physical Exam  BP 125/86 (BP Location: Left Arm)   Pulse (!) 108   Temp 98.3 F (36.8 C) (Oral)   Resp 16   Ht 5' (1.524 m)   Wt 49.9 kg   SpO2 95%   BMI 21.48 kg/m  Gen:   Awake, no distress   HEENT:  Atraumatic  Resp:  Normal effort  Cardiac:  Normal rate  Abd:   Nondistended, nontender  MSK:   Moves extremities without difficulty  Neuro:  Speech clear   Medical Decision Making  Medically screening exam initiated at 12:27 AM.  Appropriate orders placed.  [name] was informed that the remainder of the evaluation will be completed by another provider, this initial triage assessment does not replace that evaluation, and the importance of remaining in the ED until their evaluation is complete.  Clinical Impression  19F with history of CVA in may with chronic body pain and HA now p/w N/V/D x 3 days and concerns for dehydration.  Labs reviewed concerning for early DKA vs dehydration with glucose 409 and AG 17, normal bicarbonate. Will check VBG and UA. Normal electrolytes, normal LFTs and lipase.   Abdomen soft and non tender and non distended. Mild tachycardia with no fever.  Ddx viral gastroenteritis, covid, flu, DKA. Plan for IVF, tylenol, zofran   Alfred Levins, [place], MD 03/14/20 [phone]

## 2020-03-14 NOTE — Discharge Instructions (Signed)
Increase oral hydration.  Take Zofran as needed for nausea.  Make sure to monitor your sugars closely at home.  If your sugars greater than 400 return to the emergency room.  If you develop abdominal pain or difficulty breathing or chest pain or fever return to the ER.  Otherwise follow-up with your primary care doctor.  Your Covid and flu tests are pending and should be available within 24 hours on my chart.  If they are positive make sure to quarantine yourself per CDC recommendations.  Return to the ER at any time for further care.

## 2020-03-14 NOTE — ED Notes (Signed)
Patient left prior to receiving discharge instructions. 

## 2020-03-15 LAB — MISC LABCORP TEST (SEND OUT): Labcorp test code: 186064

## 2020-04-13 LAB — BLOOD GAS, VENOUS
Acid-Base Excess: 6.1 mmol/L — ABNORMAL HIGH (ref 0.0–2.0)
Bicarbonate: 32.3 mmol/L — ABNORMAL HIGH (ref 20.0–28.0)
O2 Saturation: 53.6 %
Patient temperature: 37
pCO2, Ven: 51 mmHg (ref 44.0–60.0)
pH, Ven: 7.41 (ref 7.250–7.430)

## 2020-09-12 DIAGNOSIS — R109 Unspecified abdominal pain: Secondary | ICD-10-CM | POA: Insufficient documentation

## 2020-09-12 DIAGNOSIS — Z5321 Procedure and treatment not carried out due to patient leaving prior to being seen by health care provider: Secondary | ICD-10-CM | POA: Diagnosis not present

## 2020-09-12 DIAGNOSIS — R112 Nausea with vomiting, unspecified: Secondary | ICD-10-CM | POA: Insufficient documentation

## 2020-09-12 LAB — COMPREHENSIVE METABOLIC PANEL
ALT: 12 U/L (ref 0–44)
AST: 19 U/L (ref 15–41)
Albumin: 3.4 g/dL — ABNORMAL LOW (ref 3.5–5.0)
Alkaline Phosphatase: 103 U/L (ref 38–126)
Anion gap: 12 (ref 5–15)
BUN: 7 mg/dL (ref 6–20)
CO2: 30 mmol/L (ref 22–32)
Calcium: 8.9 mg/dL (ref 8.9–10.3)
Chloride: 93 mmol/L — ABNORMAL LOW (ref 98–111)
Creatinine, Ser: 0.52 mg/dL (ref 0.44–1.00)
GFR, Estimated: >60 mL/min (ref >60)
Glucose, Bld: 360 mg/dL — ABNORMAL HIGH (ref 70–99)
Potassium: 3.3 mmol/L — ABNORMAL LOW (ref 3.5–5.1)
Sodium: 135 mmol/L (ref 135–145)
Total Bilirubin: 0.9 mg/dL (ref 0.3–1.2)
Total Protein: 8.4 g/dL — ABNORMAL HIGH (ref 6.5–8.1)

## 2020-09-12 LAB — CBC
HCT: 41.8 % (ref 36.0–46.0)
Hemoglobin: 14.5 g/dL (ref 12.0–15.0)
MCH: 31.9 pg (ref 26.0–34.0)
MCHC: 34.7 g/dL (ref 30.0–36.0)
MCV: 91.9 fL (ref 80.0–100.0)
Platelets: 434 10*3/uL — ABNORMAL HIGH (ref 150–400)
RBC: 4.55 MIL/uL (ref 3.87–5.11)
RDW: 13.2 % (ref 11.5–15.5)
WBC: 11.8 10*3/uL — ABNORMAL HIGH (ref 4.0–10.5)
nRBC: 0 % (ref 0.0–0.2)

## 2020-09-12 LAB — LIPASE, BLOOD: Lipase: 140 U/L — ABNORMAL HIGH (ref 11–51)

## 2020-09-12 MED ORDER — ONDANSETRON 4 MG PO TBDP
4.0000 mg | ORAL_TABLET | Freq: Once | ORAL | Status: AC | PRN
  Administered 2020-09-12: 4 mg via ORAL

## 2020-09-12 MED ORDER — ONDANSETRON 4 MG PO TBDP
ORAL_TABLET | ORAL | Status: AC
  Filled 2020-09-12: qty 1

## 2020-09-12 NOTE — ED Triage Notes (Signed)
Pt presents to the ED with c/o diffuse abd pain and N/V that began this morning.

## 2020-09-13 ENCOUNTER — Emergency Department
Admission: EM | Admit: 2020-09-13 | Discharge: 2020-09-13 | Disposition: A | Discharge status: Home / Self Care | Payer: Medicare Other | Attending: Emergency Medicine | Admitting: Emergency Medicine

## 2020-09-14 ENCOUNTER — Telehealth: Payer: Self-pay | Admitting: Emergency Medicine

## 2020-09-14 NOTE — Telephone Encounter (Signed)
Called patient due to left emergency department before provider exam to inquire about condition and follow up plans. She said she is feeling better now.  I advised her to still call her pcp and have them review her labs.  She agrees.

## 2020-10-25 ENCOUNTER — Other Ambulatory Visit: Payer: Self-pay

## 2020-10-25 ENCOUNTER — Encounter: Payer: Self-pay | Admitting: Emergency Medicine

## 2020-10-25 ENCOUNTER — Emergency Department: Payer: Medicare Other

## 2020-10-25 ENCOUNTER — Emergency Department
Admission: EM | Admit: 2020-10-25 | Discharge: 2020-10-25 | Disposition: A | Discharge status: Home / Self Care | Payer: Medicare Other | Attending: Emergency Medicine | Admitting: Emergency Medicine

## 2020-10-25 DIAGNOSIS — Y900 Blood alcohol level of less than 20 mg/100 ml: Secondary | ICD-10-CM | POA: Insufficient documentation

## 2020-10-25 DIAGNOSIS — Z7982 Long term (current) use of aspirin: Secondary | ICD-10-CM | POA: Insufficient documentation

## 2020-10-25 DIAGNOSIS — E860 Dehydration: Secondary | ICD-10-CM

## 2020-10-25 DIAGNOSIS — R55 Syncope and collapse: Secondary | ICD-10-CM | POA: Diagnosis present

## 2020-10-25 DIAGNOSIS — E1110 Type 2 diabetes mellitus with ketoacidosis without coma: Secondary | ICD-10-CM | POA: Diagnosis not present

## 2020-10-25 DIAGNOSIS — Z79899 Other long term (current) drug therapy: Secondary | ICD-10-CM | POA: Diagnosis not present

## 2020-10-25 DIAGNOSIS — Z20822 Contact with and (suspected) exposure to covid-19: Secondary | ICD-10-CM | POA: Insufficient documentation

## 2020-10-25 DIAGNOSIS — Z794 Long term (current) use of insulin: Secondary | ICD-10-CM | POA: Diagnosis not present

## 2020-10-25 DIAGNOSIS — R42 Dizziness and giddiness: Secondary | ICD-10-CM | POA: Diagnosis not present

## 2020-10-25 DIAGNOSIS — Z7984 Long term (current) use of oral hypoglycemic drugs: Secondary | ICD-10-CM | POA: Insufficient documentation

## 2020-10-25 DIAGNOSIS — Z8507 Personal history of malignant neoplasm of pancreas: Secondary | ICD-10-CM | POA: Diagnosis not present

## 2020-10-25 DIAGNOSIS — F1290 Cannabis use, unspecified, uncomplicated: Secondary | ICD-10-CM | POA: Insufficient documentation

## 2020-10-25 DIAGNOSIS — F17200 Nicotine dependence, unspecified, uncomplicated: Secondary | ICD-10-CM | POA: Diagnosis not present

## 2020-10-25 DIAGNOSIS — Z859 Personal history of malignant neoplasm, unspecified: Secondary | ICD-10-CM | POA: Diagnosis not present

## 2020-10-25 DIAGNOSIS — I10 Essential (primary) hypertension: Secondary | ICD-10-CM | POA: Diagnosis not present

## 2020-10-25 DIAGNOSIS — J449 Chronic obstructive pulmonary disease, unspecified: Secondary | ICD-10-CM | POA: Insufficient documentation

## 2020-10-25 LAB — ETHANOL: Alcohol, Ethyl (B): <10 mg/dL (ref <10)

## 2020-10-25 LAB — CBC WITH DIFFERENTIAL/PLATELET
Abs Immature Granulocytes: 0.09 10*3/uL — ABNORMAL HIGH (ref 0.00–0.07)
Basophils Absolute: 0.1 10*3/uL (ref 0.0–0.1)
Basophils Relative: 1 %
Eosinophils Absolute: 0.1 10*3/uL (ref 0.0–0.5)
Eosinophils Relative: 1 %
HCT: 39.6 % (ref 36.0–46.0)
Hemoglobin: 13.4 g/dL (ref 12.0–15.0)
Immature Granulocytes: 1 %
Lymphocytes Relative: 43 %
Lymphs Abs: 3.8 10*3/uL (ref 0.7–4.0)
MCH: 31 pg (ref 26.0–34.0)
MCHC: 33.8 g/dL (ref 30.0–36.0)
MCV: 91.7 fL (ref 80.0–100.0)
Monocytes Absolute: 0.6 10*3/uL (ref 0.1–1.0)
Monocytes Relative: 7 %
Neutro Abs: 4.2 10*3/uL (ref 1.7–7.7)
Neutrophils Relative %: 47 %
Platelets: 472 10*3/uL — ABNORMAL HIGH (ref 150–400)
RBC: 4.32 MIL/uL (ref 3.87–5.11)
RDW: 13.9 % (ref 11.5–15.5)
WBC: 8.8 10*3/uL (ref 4.0–10.5)
nRBC: 0 % (ref 0.0–0.2)

## 2020-10-25 LAB — COMPREHENSIVE METABOLIC PANEL
ALT: 13 U/L (ref 0–44)
AST: 16 U/L (ref 15–41)
Albumin: 2.9 g/dL — ABNORMAL LOW (ref 3.5–5.0)
Alkaline Phosphatase: 104 U/L (ref 38–126)
Anion gap: 8 (ref 5–15)
BUN: 18 mg/dL (ref 6–20)
CO2: 32 mmol/L (ref 22–32)
Calcium: 8.7 mg/dL — ABNORMAL LOW (ref 8.9–10.3)
Chloride: 99 mmol/L (ref 98–111)
Creatinine, Ser: 0.79 mg/dL (ref 0.44–1.00)
GFR, Estimated: >60 mL/min (ref >60)
Glucose, Bld: 137 mg/dL — ABNORMAL HIGH (ref 70–99)
Potassium: 3.4 mmol/L — ABNORMAL LOW (ref 3.5–5.1)
Sodium: 139 mmol/L (ref 135–145)
Total Bilirubin: 0.6 mg/dL (ref 0.3–1.2)
Total Protein: 7.3 g/dL (ref 6.5–8.1)

## 2020-10-25 LAB — URINE DRUG SCREEN, QUALITATIVE (ARMC ONLY)
Amphetamines, Ur Screen: NOT DETECTED
Barbiturates, Ur Screen: NOT DETECTED
Benzodiazepine, Ur Scrn: NOT DETECTED
Cannabinoid 50 Ng, Ur ~~LOC~~: POSITIVE — AB
Cocaine Metabolite,Ur ~~LOC~~: NOT DETECTED
MDMA (Ecstasy)Ur Screen: NOT DETECTED
Methadone Scn, Ur: NOT DETECTED
Opiate, Ur Screen: NOT DETECTED
Phencyclidine (PCP) Ur S: NOT DETECTED
Tricyclic, Ur Screen: NOT DETECTED

## 2020-10-25 LAB — URINALYSIS, ROUTINE W REFLEX MICROSCOPIC
Bilirubin Urine: NEGATIVE
Glucose, UA: 150 mg/dL — AB
Hgb urine dipstick: NEGATIVE
Ketones, ur: NEGATIVE mg/dL
Nitrite: NEGATIVE
Protein, ur: 100 mg/dL — AB
Specific Gravity, Urine: 1.025 (ref 1.005–1.030)
pH: 5 (ref 5.0–8.0)

## 2020-10-25 LAB — TROPONIN I (HIGH SENSITIVITY)
Troponin I (High Sensitivity): 11 ng/L (ref <18)
Troponin I (High Sensitivity): 13 ng/L (ref <18)

## 2020-10-25 LAB — RESP PANEL BY RT-PCR (FLU A&B, COVID) ARPGX2
Influenza A by PCR: NEGATIVE
Influenza B by PCR: NEGATIVE
SARS Coronavirus 2 by RT PCR: NEGATIVE

## 2020-10-25 LAB — MAGNESIUM: Magnesium: 1.8 mg/dL (ref 1.7–2.4)

## 2020-10-25 LAB — T4, FREE: Free T4: 1.11 ng/dL (ref 0.61–1.12)

## 2020-10-25 LAB — TSH: TSH: 1.551 u[IU]/mL (ref 0.350–4.500)

## 2020-10-25 MED ORDER — SODIUM CHLORIDE 0.9 % IV BOLUS
1000.0000 mL | Freq: Once | INTRAVENOUS | Status: AC
  Administered 2020-10-25: 1000 mL via INTRAVENOUS

## 2020-10-25 MED ORDER — FOSFOMYCIN TROMETHAMINE 3 G PO PACK
3.0000 g | PACK | Freq: Once | ORAL | Status: AC
  Administered 2020-10-25: 3 g via ORAL
  Filled 2020-10-25: qty 3

## 2020-10-25 NOTE — Discharge Instructions (Addendum)
Please seek medical attention for any high fevers, chest pain, shortness of breath, change in behavior, persistent vomiting, bloody stool or any other new or concerning symptoms.  

## 2020-10-25 NOTE — ED Notes (Signed)
Pt requested for a depend, new warm blankets and also for a ginger to drink.

## 2020-10-25 NOTE — ED Provider Notes (Signed)
Patient secondary my without concerning findings.  Patient's urine is concerning for possible urinary tract infection.  There were very numerous squamous epithelials.  I discussed this with the patient.  Will give dose of fosfomycin here in the emergency department.  Patient did continue to feel better here in the emergency department.  No further episodes of hypotension.  This time do think would be reasonable for patient be discharged home.   Nance Pear, MD 10/25/20 [phone]
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
## 2020-10-25 NOTE — ED Notes (Signed)
Wet wipes given to pt.

## 2020-10-25 NOTE — ED Triage Notes (Signed)
Per EMS pt coming from home, EMS unsure of complete story. Either family found patient unresponsive on couch or pt passed out in front of patient. EMS report initial BP 55/33 and responsive to painful stimuli on arrival. Gave 400 CC normal saline and BP to 72/40. Patient responds to verbal stimuli on arrival. Admits to smoking marijuana. Patient states she never passed out and just "laid myself down on the couch." Patient moving all extremities

## 2020-11-04 ENCOUNTER — Emergency Department: Payer: Medicare Other

## 2020-11-04 ENCOUNTER — Inpatient Hospital Stay: Payer: Medicare Other

## 2020-11-04 ENCOUNTER — Other Ambulatory Visit: Payer: Self-pay

## 2020-11-04 ENCOUNTER — Inpatient Hospital Stay
Admission: EM | Admit: 2020-11-04 | Discharge: 2020-11-08 | DRG: 065 | Disposition: A | Discharge status: Home Health | Payer: Medicare Other | Attending: Internal Medicine | Admitting: Internal Medicine

## 2020-11-04 DIAGNOSIS — Z79899 Other long term (current) drug therapy: Secondary | ICD-10-CM | POA: Diagnosis not present

## 2020-11-04 DIAGNOSIS — F32A Depression, unspecified: Secondary | ICD-10-CM | POA: Diagnosis present

## 2020-11-04 DIAGNOSIS — F17200 Nicotine dependence, unspecified, uncomplicated: Secondary | ICD-10-CM | POA: Diagnosis present

## 2020-11-04 DIAGNOSIS — G2581 Restless legs syndrome: Secondary | ICD-10-CM | POA: Diagnosis present

## 2020-11-04 DIAGNOSIS — R29703 NIHSS score 3: Secondary | ICD-10-CM | POA: Diagnosis present

## 2020-11-04 DIAGNOSIS — E876 Hypokalemia: Secondary | ICD-10-CM | POA: Diagnosis present

## 2020-11-04 DIAGNOSIS — K8689 Other specified diseases of pancreas: Secondary | ICD-10-CM | POA: Diagnosis present

## 2020-11-04 DIAGNOSIS — Z7982 Long term (current) use of aspirin: Secondary | ICD-10-CM

## 2020-11-04 DIAGNOSIS — I959 Hypotension, unspecified: Secondary | ICD-10-CM | POA: Diagnosis present

## 2020-11-04 DIAGNOSIS — E785 Hyperlipidemia, unspecified: Secondary | ICD-10-CM | POA: Diagnosis present

## 2020-11-04 DIAGNOSIS — Z20822 Contact with and (suspected) exposure to covid-19: Secondary | ICD-10-CM | POA: Diagnosis present

## 2020-11-04 DIAGNOSIS — Z794 Long term (current) use of insulin: Secondary | ICD-10-CM

## 2020-11-04 DIAGNOSIS — R4701 Aphasia: Secondary | ICD-10-CM | POA: Diagnosis present

## 2020-11-04 DIAGNOSIS — E1142 Type 2 diabetes mellitus with diabetic polyneuropathy: Secondary | ICD-10-CM | POA: Diagnosis present

## 2020-11-04 DIAGNOSIS — Z90410 Acquired total absence of pancreas: Secondary | ICD-10-CM

## 2020-11-04 DIAGNOSIS — Z885 Allergy status to narcotic agent status: Secondary | ICD-10-CM

## 2020-11-04 DIAGNOSIS — I10 Essential (primary) hypertension: Secondary | ICD-10-CM | POA: Diagnosis present

## 2020-11-04 DIAGNOSIS — E1165 Type 2 diabetes mellitus with hyperglycemia: Secondary | ICD-10-CM | POA: Diagnosis present

## 2020-11-04 DIAGNOSIS — E871 Hypo-osmolality and hyponatremia: Secondary | ICD-10-CM | POA: Diagnosis present

## 2020-11-04 DIAGNOSIS — Z8673 Personal history of transient ischemic attack (TIA), and cerebral infarction without residual deficits: Secondary | ICD-10-CM

## 2020-11-04 DIAGNOSIS — I639 Cerebral infarction, unspecified: Principal | ICD-10-CM | POA: Diagnosis present

## 2020-11-04 DIAGNOSIS — Z9081 Acquired absence of spleen: Secondary | ICD-10-CM

## 2020-11-04 DIAGNOSIS — E878 Other disorders of electrolyte and fluid balance, not elsewhere classified: Secondary | ICD-10-CM | POA: Diagnosis present

## 2020-11-04 DIAGNOSIS — R29810 Facial weakness: Secondary | ICD-10-CM | POA: Diagnosis present

## 2020-11-04 DIAGNOSIS — H538 Other visual disturbances: Secondary | ICD-10-CM | POA: Diagnosis present

## 2020-11-04 DIAGNOSIS — Z881 Allergy status to other antibiotic agents status: Secondary | ICD-10-CM

## 2020-11-04 DIAGNOSIS — R4702 Dysphasia: Secondary | ICD-10-CM | POA: Diagnosis present

## 2020-11-04 DIAGNOSIS — Z818 Family history of other mental and behavioral disorders: Secondary | ICD-10-CM

## 2020-11-04 DIAGNOSIS — R739 Hyperglycemia, unspecified: Secondary | ICD-10-CM

## 2020-11-04 DIAGNOSIS — Z9119 Patient's noncompliance with other medical treatment and regimen: Secondary | ICD-10-CM | POA: Diagnosis not present

## 2020-11-04 DIAGNOSIS — E1169 Type 2 diabetes mellitus with other specified complication: Secondary | ICD-10-CM | POA: Diagnosis not present

## 2020-11-04 DIAGNOSIS — Z7984 Long term (current) use of oral hypoglycemic drugs: Secondary | ICD-10-CM

## 2020-11-04 DIAGNOSIS — R471 Dysarthria and anarthria: Secondary | ICD-10-CM | POA: Diagnosis present

## 2020-11-04 DIAGNOSIS — Z888 Allergy status to other drugs, medicaments and biological substances status: Secondary | ICD-10-CM

## 2020-11-04 LAB — COMPREHENSIVE METABOLIC PANEL
ALT: 12 U/L (ref 0–44)
AST: 16 U/L (ref 15–41)
Albumin: 3.4 g/dL — ABNORMAL LOW (ref 3.5–5.0)
Alkaline Phosphatase: 103 U/L (ref 38–126)
Anion gap: 10 (ref 5–15)
BUN: 11 mg/dL (ref 6–20)
CO2: 28 mmol/L (ref 22–32)
Calcium: 8.8 mg/dL — ABNORMAL LOW (ref 8.9–10.3)
Chloride: 91 mmol/L — ABNORMAL LOW (ref 98–111)
Creatinine, Ser: 0.79 mg/dL (ref 0.44–1.00)
GFR, Estimated: >60 mL/min (ref >60)
Glucose, Bld: 465 mg/dL — ABNORMAL HIGH (ref 70–99)
Potassium: 3.9 mmol/L (ref 3.5–5.1)
Sodium: 129 mmol/L — ABNORMAL LOW (ref 135–145)
Total Bilirubin: 0.9 mg/dL (ref 0.3–1.2)
Total Protein: 8.1 g/dL (ref 6.5–8.1)

## 2020-11-04 LAB — RESP PANEL BY RT-PCR (FLU A&B, COVID) ARPGX2
Influenza A by PCR: NEGATIVE
Influenza B by PCR: NEGATIVE
SARS Coronavirus 2 by RT PCR: NEGATIVE

## 2020-11-04 LAB — CBC WITH DIFFERENTIAL/PLATELET
Abs Immature Granulocytes: 0.02 10*3/uL (ref 0.00–0.07)
Basophils Absolute: 0 10*3/uL (ref 0.0–0.1)
Basophils Relative: 1 %
Eosinophils Absolute: 0 10*3/uL (ref 0.0–0.5)
Eosinophils Relative: 0 %
HCT: 43.4 % (ref 36.0–46.0)
Hemoglobin: 14.9 g/dL (ref 12.0–15.0)
Immature Granulocytes: 0 %
Lymphocytes Relative: 38 %
Lymphs Abs: 2.5 10*3/uL (ref 0.7–4.0)
MCH: 31.4 pg (ref 26.0–34.0)
MCHC: 34.3 g/dL (ref 30.0–36.0)
MCV: 91.6 fL (ref 80.0–100.0)
Monocytes Absolute: 0.4 10*3/uL (ref 0.1–1.0)
Monocytes Relative: 6 %
Neutro Abs: 3.5 10*3/uL (ref 1.7–7.7)
Neutrophils Relative %: 55 %
Platelets: 431 10*3/uL — ABNORMAL HIGH (ref 150–400)
RBC: 4.74 MIL/uL (ref 3.87–5.11)
RDW: 13.7 % (ref 11.5–15.5)
WBC: 6.4 10*3/uL (ref 4.0–10.5)
nRBC: 0 % (ref 0.0–0.2)

## 2020-11-04 LAB — ETHANOL: Alcohol, Ethyl (B): <10 mg/dL (ref <10)

## 2020-11-04 LAB — CBG MONITORING, ED: Glucose-Capillary: 544 mg/dL (ref 70–99)

## 2020-11-04 LAB — BETA-HYDROXYBUTYRIC ACID: Beta-Hydroxybutyric Acid: 1.74 mmol/L — ABNORMAL HIGH (ref 0.05–0.27)

## 2020-11-04 MED ORDER — PANCRELIPASE (LIP-PROT-AMYL) 24000-76000 UNITS PO CPEP: 3.0000 | ORAL_CAPSULE | Freq: Three times a day (TID) | ORAL | Status: DC

## 2020-11-04 MED ORDER — ATORVASTATIN CALCIUM 20 MG PO TABS
80.0000 mg | ORAL_TABLET | Freq: Every evening | ORAL | Status: DC
  Administered 2020-11-05 – 2020-11-07 (×3): 80 mg via ORAL
  Filled 2020-11-04 (×3): qty 4

## 2020-11-04 MED ORDER — GABAPENTIN 600 MG PO TABS
600.0000 mg | ORAL_TABLET | Freq: Every day | ORAL | Status: DC
  Administered 2020-11-05 – 2020-11-07 (×3): 600 mg via ORAL
  Filled 2020-11-04 (×3): qty 1

## 2020-11-04 MED ORDER — ACETAMINOPHEN 650 MG RE SUPP: 650.0000 mg | Freq: Four times a day (QID) | RECTAL | Status: DC | PRN

## 2020-11-04 MED ORDER — TRAMADOL HCL 50 MG PO TABS
50.0000 mg | ORAL_TABLET | Freq: Four times a day (QID) | ORAL | Status: DC | PRN
  Administered 2020-11-05 (×2): 50 mg via ORAL
  Filled 2020-11-04 (×2): qty 1

## 2020-11-04 MED ORDER — ADULT MULTIVITAMIN W/MINERALS CH
1.0000 | ORAL_TABLET | Freq: Every day | ORAL | Status: DC
  Administered 2020-11-05 – 2020-11-08 (×4): 1 via ORAL
  Filled 2020-11-04 (×4): qty 1

## 2020-11-04 MED ORDER — MAGNESIUM HYDROXIDE 400 MG/5ML PO SUSP
30.0000 mL | Freq: Every day | ORAL | Status: DC | PRN
  Filled 2020-11-04: qty 30

## 2020-11-04 MED ORDER — SODIUM CHLORIDE 0.9 % IV SOLN: INTRAVENOUS | Status: DC

## 2020-11-04 MED ORDER — ACETAMINOPHEN 325 MG PO TABS: 650.0000 mg | ORAL_TABLET | Freq: Four times a day (QID) | ORAL | Status: DC | PRN

## 2020-11-04 MED ORDER — SODIUM CHLORIDE 0.9 % IV BOLUS
1000.0000 mL | Freq: Once | INTRAVENOUS | Status: AC
  Administered 2020-11-04: 1000 mL via INTRAVENOUS

## 2020-11-04 MED ORDER — CITALOPRAM HYDROBROMIDE 20 MG PO TABS
10.0000 mg | ORAL_TABLET | Freq: Every day | ORAL | Status: DC
  Administered 2020-11-06 – 2020-11-08 (×3): 10 mg via ORAL
  Filled 2020-11-04 (×3): qty 1

## 2020-11-04 MED ORDER — DIVALPROEX SODIUM 125 MG PO CSDR
250.0000 mg | DELAYED_RELEASE_CAPSULE | Freq: Two times a day (BID) | ORAL | Status: DC
Start: 2020-11-04 — End: 2020-11-08
  Administered 2020-11-05 – 2020-11-08 (×6): 250 mg via ORAL
  Filled 2020-11-04 (×7): qty 2

## 2020-11-04 MED ORDER — ENOXAPARIN SODIUM 40 MG/0.4ML IJ SOSY
40.0000 mg | PREFILLED_SYRINGE | INTRAMUSCULAR | Status: DC
  Administered 2020-11-05 – 2020-11-07 (×4): 40 mg via SUBCUTANEOUS
  Filled 2020-11-04 (×4): qty 0.4

## 2020-11-04 MED ORDER — ROPINIROLE HCL 1 MG PO TABS
1.0000 mg | ORAL_TABLET | Freq: Two times a day (BID) | ORAL | Status: DC
  Administered 2020-11-05 – 2020-11-08 (×6): 1 mg via ORAL
  Filled 2020-11-04 (×6): qty 1

## 2020-11-04 MED ORDER — ASPIRIN 81 MG PO CHEW
81.0000 mg | CHEWABLE_TABLET | Freq: Every day | ORAL | Status: DC
  Administered 2020-11-05 – 2020-11-08 (×5): 81 mg via ORAL
  Filled 2020-11-04 (×5): qty 1

## 2020-11-04 MED ORDER — STROKE: EARLY STAGES OF RECOVERY BOOK: Freq: Once | Status: AC

## 2020-11-04 MED ORDER — CLOPIDOGREL BISULFATE 75 MG PO TABS
75.0000 mg | ORAL_TABLET | Freq: Every day | ORAL | Status: DC
  Administered 2020-11-05 – 2020-11-08 (×5): 75 mg via ORAL
  Filled 2020-11-04 (×6): qty 1

## 2020-11-04 MED ORDER — BENAZEPRIL HCL 5 MG PO TABS
5.0000 mg | ORAL_TABLET | Freq: Every day | ORAL | Status: DC
  Administered 2020-11-06 – 2020-11-08 (×3): 5 mg via ORAL
  Filled 2020-11-04 (×4): qty 1

## 2020-11-04 MED ORDER — INSULIN ASPART 100 UNIT/ML IJ SOLN
8.0000 [IU] | Freq: Once | INTRAMUSCULAR | Status: AC
  Administered 2020-11-04: 8 [IU] via INTRAVENOUS
  Filled 2020-11-04: qty 1

## 2020-11-04 MED ORDER — HYDROXYZINE HCL 10 MG PO TABS
10.0000 mg | ORAL_TABLET | Freq: Two times a day (BID) | ORAL | Status: DC
  Administered 2020-11-05 – 2020-11-06 (×2): 10 mg via ORAL
  Filled 2020-11-04 (×3): qty 1

## 2020-11-04 MED ORDER — ONDANSETRON HCL 4 MG/2ML IJ SOLN: 4.0000 mg | Freq: Four times a day (QID) | INTRAMUSCULAR | Status: DC | PRN

## 2020-11-04 MED ORDER — ONDANSETRON HCL 4 MG PO TABS: 4.0000 mg | ORAL_TABLET | Freq: Four times a day (QID) | ORAL | Status: DC | PRN

## 2020-11-04 MED ORDER — MIRTAZAPINE 15 MG PO TABS
15.0000 mg | ORAL_TABLET | Freq: Every day | ORAL | Status: DC
  Administered 2020-11-05 – 2020-11-07 (×3): 15 mg via ORAL
  Filled 2020-11-04 (×3): qty 1

## 2020-11-04 MED ORDER — NICOTINE POLACRILEX 2 MG MT GUM
2.0000 mg | CHEWING_GUM | OROMUCOSAL | Status: DC | PRN
  Filled 2020-11-04: qty 1

## 2020-11-04 MED ORDER — FOLIC ACID 1 MG PO TABS
1.0000 mg | ORAL_TABLET | Freq: Every day | ORAL | Status: DC
  Administered 2020-11-05 – 2020-11-08 (×4): 1 mg via ORAL
  Filled 2020-11-04 (×4): qty 1

## 2020-11-04 MED ORDER — TRAZODONE HCL 50 MG PO TABS
25.0000 mg | ORAL_TABLET | Freq: Every evening | ORAL | Status: DC | PRN
  Administered 2020-11-05 (×2): 25 mg via ORAL
  Filled 2020-11-04 (×2): qty 1

## 2020-11-04 MED ORDER — CARVEDILOL 3.125 MG PO TABS
3.1250 mg | ORAL_TABLET | Freq: Two times a day (BID) | ORAL | Status: DC
  Administered 2020-11-05 – 2020-11-08 (×7): 3.125 mg via ORAL
  Filled 2020-11-04 (×7): qty 1

## 2020-11-04 NOTE — ED Notes (Signed)
RT called to run VBG

## 2020-11-04 NOTE — ED Notes (Signed)
IV team is with pt in [place] attempting to start line, will move pt to treatment room when she is complete

## 2020-11-04 NOTE — ED Triage Notes (Addendum)
Pt presents to ED via EMS with c/o hypotension per EMS, pt's BP is 141/99. Pt does appear under the influence and states HX of marijuana use. Pt states daughter called EMS and is unsure of why she is here. Pt is not sure of why she is here and is a poor historian at this time.   Pt denies chest pain, SOB, N/V/D. Per fist nurse note pt is here for hyperglycemia, pt still not sure if that's why she is here.   CBG here: 544
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
## 2020-11-04 NOTE — ED Notes (Signed)
IV team with pt at this time

## 2020-11-04 NOTE — ED Notes (Signed)
Pt is A&O x 3 unable to state president. Pt is slurring words. Denies any drugs or alcohol. Pt texting on phone. No complaints of pain. Pt states she is cold. Warm blanket provided. Call bell in reach bed in lowest locked position

## 2020-11-04 NOTE — ED Notes (Signed)
Pt here via EMS with c/o hyperglycemia reading "high" on the cbg machine per EMS. 145/95; HR 80; 97.1; 100%RA; having occasional slurred speech, ambulatory on scene, headache the past 2 days, some confusion per EMS. Placed in chair in lobby, NAD.

## 2020-11-04 NOTE — ED Notes (Signed)
Patient transported to CT 

## 2020-11-04 NOTE — ED Notes (Signed)
Patient transported to MRI 

## 2020-11-04 NOTE — H&P (Addendum)
Brittany [name]   PATIENT NAME: Brittany [name]    MR#:  [id number]  DATE OF BIRTH:  03-17-1960  DATE OF ADMISSION:  11/04/2020  PRIMARY CARE PHYSICIAN: [facility]   Patient is coming from: Home  REQUESTING/REFERRING PHYSICIAN: Blake Divine, MD  CHIEF COMPLAINT:   Chief Complaint  Patient presents with   Hyperglycemia    HISTORY OF PRESENT ILLNESS:  Brittany [name] is a 60 y.o. African-American female with medical history significant for type II diabetes mellitus, hypertension, CVA, lupus and collagen vascular disease, who presented to the ER with acute onset of altered mental status with confusion which has been going on over the last 2 to 3 days with associated expressive dysphasia and dysarthria.  She admitted to right-sided headache without dizziness or blurred vision.  She feels her right upper and lower extremity are slightly weaker.  She uses a cane to ambulate and will still able to use it. No tinnitus or vertigo. No urinary or stool incontinence.  No witnessed seizures. No chest pain or palpitations.   No cough or wheezing or dyspnea.    She denies any dysuria, urinary frequency or urgency or flank pain.  The patient admits to not being compliant with aspirin despite having history of previous CVA.  ED Course: Upon presentation to the ER, blood pressure was 141/99 with otherwise normal vital signs.  Labs revealed hyponatremia and hypochloremia as well as significant hyper glycemia was blood glucose of 465.  Albumin was 3.4 and CBC was within normal.  But had extra beat rate was 1.74 and alcohol was less than 10.  Respiratory panel is currently pending.  Venous blood gas showed pH 7.41 and HCO3 of 33.6.  EKG as reviewed by me : EKG showed normal sinus rhythm with a rate of 87 with right atrial enlargement and right axis deviation. Imaging: 2 view chest x-ray showed no acute cardiopulmonary disease. -Noncontrasted head CT scan revealed large acute left occipital  lobe infarct with chronic bilateral basal ganglia lacunar infarcts and chronic left cerebellar infarct.  The patient was given 8 units of subcutaneous NovoLog and 1 L bolus of IV normal saline.  She will be admitted to a medical monitored bed for further evaluation and management. PAST MEDICAL HISTORY:   Past Medical History:  Diagnosis Date   Cancer ([place])    Collagen vascular disease ([place])    Diabetes mellitus without complication ([place])    Hypertension    Lupus ([place])     PAST SURGICAL HISTORY:   Past Surgical History:  Procedure Laterality Date   PANCREATECTOMY     spleenectomy      SOCIAL HISTORY:   Social History   Tobacco Use   Smoking status: Every Day   Smokeless tobacco: Never  Substance Use Topics   Alcohol use: Yes    FAMILY HISTORY:   Family History  Problem Relation Age of Onset   Anxiety disorder Sister    Breast cancer Maternal Aunt     DRUG ALLERGIES:   Allergies  Allergen Reactions   Cephalosporins Itching   Hydromorphone Itching   Keflin [Cephalothin] Itching    REVIEW OF SYSTEMS:   ROS As per history of present illness. All pertinent systems were reviewed above. Constitutional, HEENT, cardiovascular, respiratory, GI, GU, musculoskeletal, neuro, psychiatric, endocrine, integumentary and hematologic systems were reviewed and are otherwise negative/unremarkable except for positive findings mentioned above in the HPI.   MEDICATIONS AT HOME:   Prior to Admission medications  Medication Sig Start Date End Date Taking? Authorizing Provider  aspirin 81 MG chewable tablet Chew 1 tablet (81 mg total) by mouth daily. 07/20/19   Sharen Hones, MD  atorvastatin (LIPITOR) 80 MG tablet Take 1 tablet (80 mg total) by mouth Nightly. 08/04/19 08/05/20  Love, Ivan Anchors, PA-C  benazepril (LOTENSIN) 10 MG tablet Take 0.5 tablets (5 mg total) by mouth daily. 08/04/19   Love, Ivan Anchors, PA-C  carvedilol (COREG) 3.125 MG tablet Take 1 tablet (3.125 mg total) by mouth  2 (two) times daily with a meal. 08/04/19   Love, Ivan Anchors, PA-C  citalopram (CELEXA) 10 MG tablet Take 1 tablet (10 mg total) by mouth daily. 08/05/19   Love, Ivan Anchors, PA-C  divalproex (DEPAKOTE SPRINKLE) 125 MG capsule Take 2 capsules (250 mg total) by mouth every 12 (twelve) hours. 08/04/19   Love, Ivan Anchors, PA-C  folic acid (FOLVITE) 1 MG tablet Take 1 mg by mouth daily. 09/13/17   [provider]  gabapentin (NEURONTIN) 600 MG tablet Take 1 tablet (600 mg total) by mouth at bedtime. 08/04/19   Love, Ivan Anchors, PA-C  hydroxychloroquine (PLAQUENIL) 200 MG tablet Take 200 mg by mouth daily.  05/26/18   [provider]  hydrOXYzine (ATARAX/VISTARIL) 10 MG tablet Take 10 mg by mouth 2 (two) times daily.  12/31/16   [provider]  insulin NPH-regular Human (NOVOLIN 70/30) (70-30) 100 UNIT/ML injection Inject 28 Units into the skin 2 (two) times daily with a meal. 08/04/19   Love, Ivan Anchors, PA-C  Menthol-Methyl Salicylate (MUSCLE RUB) 10-15 % CREA Apply 1 application topically 2 (two) times daily as needed for muscle pain. 08/04/19   Love, Ivan Anchors, PA-C  metFORMIN (GLUCOPHAGE) 1000 MG tablet Take 1 tablet (1,000 mg total) by mouth daily with breakfast. 08/04/19 07/08/21  Love, Ivan Anchors, PA-C  mirtazapine (REMERON) 15 MG tablet Take 1 tablet (15 mg total) by mouth at bedtime. 08/04/19 08/03/20  Bary Leriche, PA-C  Multiple Vitamin (MULTIVITAMIN WITH MINERALS) TABS tablet Take 1 tablet by mouth daily. 08/05/19   Love, Ivan Anchors, PA-C  nicotine polacrilex (NICORETTE) 2 MG gum Take 1 each (2 mg total) by mouth as needed for smoking cessation. 08/04/19   Love, Ivan Anchors, PA-C  ondansetron (ZOFRAN ODT) 4 MG disintegrating tablet Take 1 tablet (4 mg total) by mouth every 8 (eight) hours as needed. 03/14/20   Rudene Re, MD  Pancrelipase, Lip-Prot-Amyl, 24000-76000 units CPEP Take 3 capsules by mouth 3 (three) times daily. 05/26/18   [provider]  rOPINIRole (REQUIP) 1 MG tablet  Take 1 tablet (1 mg total) by mouth 2 (two) times daily. 08/04/19   Love, Ivan Anchors, PA-C  traMADol (ULTRAM) 50 MG tablet Take 1 tablet (50 mg total) by mouth every 6 (six) hours as needed. 03/14/20 03/14/21  Rudene Re, MD      VITAL SIGNS:  Blood pressure (!) 160/97, pulse 70, temperature 98.2 F (36.8 C), temperature source Oral, resp. rate 17, height 5' (1.524 m), SpO2 100 %.  PHYSICAL EXAMINATION:  Physical Exam  GENERAL:  60 y.o.-year-old African-American female patient lying in the bed with no acute distress.  EYES: Pupils equal, round, reactive to light and accommodation. No scleral icterus. Extraocular muscles intact.  HEENT: Head atraumatic, normocephalic. Oropharynx and nasopharynx clear.  NECK:  Supple, no jugular venous distention. No thyroid enlargement, no tenderness.  LUNGS: Normal breath sounds bilaterally, no wheezing, rales,rhonchi or crepitation. No use of accessory muscles of respiration.  CARDIOVASCULAR:  Regular rate and rhythm, S1, S2 normal. No murmurs, rubs, or gallops.  ABDOMEN: Soft, nondistended, nontender. Bowel sounds present. No organomegaly or mass.  EXTREMITIES: No pedal edema, cyanosis, or clubbing.  NEUROLOGIC: Cranial nerves II through XII are intact except for mild expressive dysphasia and dysarthria. Muscle strength 5/5 in all extremities. Sensation intact. Gait not checked.  PSYCHIATRIC: The patient is alert and oriented x 2 to place and person but not perfectly to time.  Normal affect and good eye contact. SKIN: No obvious rash, lesion, or ulcer.   LABORATORY PANEL:   CBC Recent Labs  Lab 11/04/20 1824  WBC 6.4  HGB 14.9  HCT 43.4  PLT 431*   ------------------------------------------------------------------------------------------------------------------  Chemistries  Recent Labs  Lab 11/04/20 1938  NA 129*  K 3.9  CL 91*  CO2 28  GLUCOSE 465*  BUN 11  CREATININE 0.79  CALCIUM 8.8*  AST 16  ALT 12  ALKPHOS 103  BILITOT  0.9   ------------------------------------------------------------------------------------------------------------------  Cardiac Enzymes No results for input(s): TROPONINI in the last 168 hours. ------------------------------------------------------------------------------------------------------------------  RADIOLOGY:  DG Chest 2 View  Result Date: 11/04/2020 CLINICAL DATA:  Altered mental status EXAM: CHEST - 2 VIEW COMPARISON:  10/25/2020 FINDINGS: The heart size and mediastinal contours are within normal limits. Both lungs are clear. The visualized skeletal structures are unremarkable. Clips in the left upper quadrant IMPRESSION: No active cardiopulmonary disease. Electronically Signed   By: Donavan Foil M.D.   On: 11/04/2020 21:01   CT Head Wo Contrast  Result Date: 11/04/2020 CLINICAL DATA:  Altered mental status. EXAM: CT HEAD WITHOUT CONTRAST TECHNIQUE: Contiguous axial images were obtained from the base of the skull through the vertex without intravenous contrast. COMPARISON:  March 13, 2020 FINDINGS: Brain: There is mild cerebral atrophy with widening of the extra-axial spaces and ventricular dilatation. There are areas of decreased attenuation within the white matter tracts of the supratentorial brain, consistent with microvascular disease changes. A large area of cortical and white matter low attenuation is seen within the left occipital lobe. This represents a new finding when compared to the prior study. Mass effect is seen on the adjacent sulci. No midline shift is identified. A stable area of cortical encephalomalacia, with chronic adjacent white matter low attenuation, is seen within the cerebellum on the left. Chronic bilateral basal ganglia lacunar infarcts are seen. No acute intracranial or extracranial hemorrhage is identified. Vascular: No hyperdense vessel or unexpected calcification. Skull: Normal. Negative for fracture or focal lesion. Sinuses/Orbits: No acute finding.  Other: None. IMPRESSION: 1. Large, acute left occipital lobe infarct. MRI correlation is recommended. 2. Chronic bilateral basal ganglia lacunar infarcts. 3. Chronic left cerebellar infarct. Electronically Signed   By: Virgina Norfolk M.D.   On: 11/04/2020 21:04      IMPRESSION AND PLAN:  Active Problems:   Acute CVA (cerebrovascular accident) ([place])  1.  Acute left occipital lobe cerebral infarction. - The patient be admitted to a medical monitored bed. - We will follow neurochecks every 4 hours for 24 hours. - We will place on aspirin as well as Plavix. - We will place her on statin therapy and check fasting lipids. - We will obtain an neurology consultation. - I notified Dr. Cheral Marker about the patient. - PT/OT and ST consults will be obtained. - We will continue Keppra for seizure prophylaxis.  2.  Uncontrolled type 2 diabetes mellitus with hyperglycemia. - The patient will be placed on resistant supplemental coverage with NovoLog subcutaneously. - We will continue  basal coverage with Novolin 70/30. - We will continue hydration with IV normal saline. - We will obtain hemoglobin A1c level.  3.  Essential hypertension. - We will continue Lotensin and Coreg with permissive parameters.  4.  Dyslipidemia. - We will continue statin therapy.  5.  Restless leg syndrome. - We will continue her Requip.  6.  Pancreatic insufficiency, status post pancreatectomy. - We will continue her pancrelipase.  7.  Peripheral neuropathy. - We will continue Neurontin.  8.  Depression. - We will continue Wellbutrin XL.  DVT prophylaxis: Lovenox. Code Status: full code. Family Communication:  The plan of care was discussed in details with the patient (and family). I answered all questions. The patient agreed to proceed with the above mentioned plan. Further management will depend upon hospital course. Disposition Plan: Back to previous home environment Consults called: Neurology.  All the  records are reviewed and case discussed with ED provider.  Status is: Inpatient  Remains inpatient appropriate because:Altered mental status, Ongoing diagnostic testing needed not appropriate for outpatient work up, Unsafe d/c plan, IV treatments appropriate due to intensity of illness or inability to take PO, and Inpatient level of care appropriate due to severity of illness  Dispo: The patient is from: Home              Anticipated d/c is to: Home              Patient currently is not medically stable to d/c.   Difficult to place patient No   TOTAL TIME TAKING CARE OF THIS PATIENT: 55 minutes.    Christel Mormon M.D on 11/04/2020 at 9:58 PM  [facility]   From 7 PM-7 AM, contact night-coverage [email or web address]  CC: Primary care physician; [facility]

## 2020-11-05 ENCOUNTER — Inpatient Hospital Stay: Payer: Medicare Other

## 2020-11-05 DIAGNOSIS — E1169 Type 2 diabetes mellitus with other specified complication: Secondary | ICD-10-CM

## 2020-11-05 DIAGNOSIS — I639 Cerebral infarction, unspecified: Principal | ICD-10-CM

## 2020-11-05 DIAGNOSIS — E785 Hyperlipidemia, unspecified: Secondary | ICD-10-CM

## 2020-11-05 LAB — CBC
HCT: 36.7 % (ref 36.0–46.0)
Hemoglobin: 12.9 g/dL (ref 12.0–15.0)
MCH: 32 pg (ref 26.0–34.0)
MCHC: 35.1 g/dL (ref 30.0–36.0)
MCV: 91.1 fL (ref 80.0–100.0)
Platelets: 417 10*3/uL — ABNORMAL HIGH (ref 150–400)
RBC: 4.03 MIL/uL (ref 3.87–5.11)
RDW: 13.6 % (ref 11.5–15.5)
WBC: 7.7 10*3/uL (ref 4.0–10.5)
nRBC: 0 % (ref 0.0–0.2)

## 2020-11-05 LAB — LIPID PANEL
Cholesterol: 162 mg/dL (ref 0–200)
HDL: 35 mg/dL — ABNORMAL LOW (ref >40)
LDL Cholesterol: 107 mg/dL — ABNORMAL HIGH (ref 0–99)
Total CHOL/HDL Ratio: 4.6 RATIO
Triglycerides: 100 mg/dL (ref <150)
VLDL: 20 mg/dL (ref 0–40)

## 2020-11-05 LAB — BASIC METABOLIC PANEL
Anion gap: 7 (ref 5–15)
BUN: 10 mg/dL (ref 6–20)
CO2: 27 mmol/L (ref 22–32)
Calcium: 8.1 mg/dL — ABNORMAL LOW (ref 8.9–10.3)
Chloride: 96 mmol/L — ABNORMAL LOW (ref 98–111)
Creatinine, Ser: 0.62 mg/dL (ref 0.44–1.00)
GFR, Estimated: >60 mL/min (ref >60)
Glucose, Bld: 402 mg/dL — ABNORMAL HIGH (ref 70–99)
Potassium: 3.3 mmol/L — ABNORMAL LOW (ref 3.5–5.1)
Sodium: 130 mmol/L — ABNORMAL LOW (ref 135–145)

## 2020-11-05 LAB — HIV ANTIBODY (ROUTINE TESTING W REFLEX): HIV Screen 4th Generation wRfx: NONREACTIVE

## 2020-11-05 LAB — GLUCOSE, CAPILLARY
Glucose-Capillary: 363 mg/dL — ABNORMAL HIGH (ref 70–99)
Glucose-Capillary: 209 mg/dL — ABNORMAL HIGH (ref 70–99)
Glucose-Capillary: 155 mg/dL — ABNORMAL HIGH (ref 70–99)
Glucose-Capillary: 253 mg/dL — ABNORMAL HIGH (ref 70–99)

## 2020-11-05 MED ORDER — GLUCERNA SHAKE PO LIQD
237.0000 mL | Freq: Three times a day (TID) | ORAL | Status: DC
  Administered 2020-11-05: 22:00:00 237 mL via ORAL

## 2020-11-05 MED ORDER — INSULIN ASPART 100 UNIT/ML IJ SOLN
0.0000 [IU] | Freq: Three times a day (TID) | INTRAMUSCULAR | Status: DC
  Administered 2020-11-05: 13:00:00 7 [IU] via SUBCUTANEOUS
  Administered 2020-11-05: 20 [IU] via SUBCUTANEOUS
  Administered 2020-11-05: 4 [IU] via SUBCUTANEOUS
  Administered 2020-11-05: 11 [IU] via SUBCUTANEOUS
  Administered 2020-11-06 (×2): 4 [IU] via SUBCUTANEOUS
  Administered 2020-11-06: 7 [IU] via SUBCUTANEOUS
  Administered 2020-11-06: 3 [IU] via SUBCUTANEOUS
  Administered 2020-11-07 – 2020-11-08 (×3): 11 [IU] via SUBCUTANEOUS
  Administered 2020-11-08: 12:00:00 4 [IU] via SUBCUTANEOUS
  Filled 2020-11-05 (×12): qty 1

## 2020-11-05 MED ORDER — POTASSIUM CHLORIDE CRYS ER 20 MEQ PO TBCR
40.0000 meq | EXTENDED_RELEASE_TABLET | Freq: Once | ORAL | Status: AC
  Administered 2020-11-05: 08:00:00 40 meq via ORAL
  Filled 2020-11-05: qty 2

## 2020-11-05 MED ORDER — PANCRELIPASE (LIP-PROT-AMYL) 36000-114000 UNITS PO CPEP
72000.0000 [IU] | ORAL_CAPSULE | Freq: Three times a day (TID) | ORAL | Status: DC
  Administered 2020-11-06 – 2020-11-08 (×8): 72000 [IU] via ORAL
  Filled 2020-11-05 (×10): qty 2

## 2020-11-05 NOTE — Consult Note (Signed)
NEURO HOSPITALIST CONSULT NOTE   Requestig physician: Dr. Louanne Belton  Reason for Consult: AMS, dysarthria and dysphasia  History obtained from:   Patient and Chart     HPI:                                                                                                                                          Brittany [name] is an 60 y.o. female with a PMHx of cancer, lupus, prior stroke, recent evaluation earlier this month for an episode of unresponsiveness in the setting of hypotension, DM and HTN who presented to the ED via EMS yesterday evening with a chief complaint of slurred speech, confusion and headache. The patient's daughter had called EMS, who on arrival noted severe hyperglycemia, reading "high" on their monitor. BP was 145/95 with HR 80, satting normally on room air and afebrile. She appeared to be confused and "under the influence" per RN note. The patient also did not seem to be aware of why she was in the hospital. She denied any numbness, weakness or symptoms of infection. She did endorse a headache. She also was able to express that she felt slightly weaker on her left side on interview by Hospitalist. She admitted to being noncompliant with her home ASA.   CBG was 544 in the ED. The patient was unable to provide signifincant history due to her confusion and difficulty speaking. Daughter endorsed 2-3 days of confusion, slurred speech and difficulty with word-finding, including having a hard time completing her sentences and not making sense for much of the time. The patient had refused to go to the hospital initially, so daughter had called EMS.   CT head in the ED revealed a large subacute left occipital lobe infarct in addition to chronic bilateral basal ganglia lacunar infarcts and a chronic left cerebellar infarct.  Past Medical History:  Diagnosis Date   Cancer ([place])    Collagen vascular disease ([place])    Diabetes mellitus without complication ([place])     Hypertension    Lupus ([place])     Past Surgical History:  Procedure Laterality Date   PANCREATECTOMY     spleenectomy      Family History  Problem Relation Age of Onset   Anxiety disorder Sister    Breast cancer Maternal Aunt               Social History:  reports that she has been smoking. She has never used smokeless tobacco. She reports current alcohol use. She reports that she does not currently use drugs.  Allergies  Allergen Reactions   Cephalosporins Itching   Hydromorphone Itching   Keflin [Cephalothin] Itching    MEDICATIONS:  Prior to Admission:  Medications Prior to Admission  Medication Sig Dispense Refill Last Dose   aspirin 81 MG chewable tablet Chew 1 tablet (81 mg total) by mouth daily. 30 tablet 0    atorvastatin (LIPITOR) 80 MG tablet Take 1 tablet (80 mg total) by mouth Nightly. 30 tablet 0    benazepril (LOTENSIN) 10 MG tablet Take 0.5 tablets (5 mg total) by mouth daily. 15 tablet 0    carvedilol (COREG) 3.125 MG tablet Take 1 tablet (3.125 mg total) by mouth 2 (two) times daily with a meal. 60 tablet 0    citalopram (CELEXA) 10 MG tablet Take 1 tablet (10 mg total) by mouth daily. 30 tablet 0    divalproex (DEPAKOTE SPRINKLE) 125 MG capsule Take 2 capsules (250 mg total) by mouth every 12 (twelve) hours. 60 capsule 0    folic acid (FOLVITE) 1 MG tablet Take 1 mg by mouth daily.  11    gabapentin (NEURONTIN) 600 MG tablet Take 1 tablet (600 mg total) by mouth at bedtime. 30 tablet 0    hydroxychloroquine (PLAQUENIL) 200 MG tablet Take 200 mg by mouth daily.       hydrOXYzine (ATARAX/VISTARIL) 10 MG tablet Take 10 mg by mouth 2 (two) times daily.       insulin NPH-regular Human (NOVOLIN 70/30) (70-30) 100 UNIT/ML injection Inject 28 Units into the skin 2 (two) times daily with a meal. 10 mL 1    Menthol-Methyl Salicylate (MUSCLE RUB) 10-15 % CREA  Apply 1 application topically 2 (two) times daily as needed for muscle pain.  0    metFORMIN (GLUCOPHAGE) 1000 MG tablet Take 1 tablet (1,000 mg total) by mouth daily with breakfast. 30 tablet 0    mirtazapine (REMERON) 15 MG tablet Take 1 tablet (15 mg total) by mouth at bedtime. 30 tablet 0    Multiple Vitamin (MULTIVITAMIN WITH MINERALS) TABS tablet Take 1 tablet by mouth daily.      nicotine polacrilex (NICORETTE) 2 MG gum Take 1 each (2 mg total) by mouth as needed for smoking cessation. 100 tablet 0    ondansetron (ZOFRAN ODT) 4 MG disintegrating tablet Take 1 tablet (4 mg total) by mouth every 8 (eight) hours as needed. 20 tablet 0    Pancrelipase, Lip-Prot-Amyl, 24000-76000 units CPEP Take 3 capsules by mouth 3 (three) times daily.      rOPINIRole (REQUIP) 1 MG tablet Take 1 tablet (1 mg total) by mouth 2 (two) times daily. 60 tablet 0    traMADol (ULTRAM) 50 MG tablet Take 1 tablet (50 mg total) by mouth every 6 (six) hours as needed. 20 tablet 0    Scheduled:  aspirin  81 mg Oral Daily   atorvastatin  80 mg Oral Nightly   benazepril  5 mg Oral Daily   carvedilol  3.125 mg Oral BID WC   citalopram  10 mg Oral Daily   clopidogrel  75 mg Oral Daily   divalproex  250 mg Oral Q12H   enoxaparin (LOVENOX) injection  40 mg Subcutaneous Q24H   feeding supplement (GLUCERNA SHAKE)  237 mL Oral TID BM   folic acid  1 mg Oral Daily   gabapentin  600 mg Oral QHS   hydrOXYzine  10 mg Oral BID   insulin aspart  0-20 Units Subcutaneous TID AC & HS   lipase/protease/amylase  72,000 Units Oral TID WC   mirtazapine  15 mg Oral QHS   multivitamin with minerals  1 tablet Oral Daily  rOPINIRole  1 mg Oral BID   Continuous:  sodium chloride 100 mL/hr at 11/05/20 0105     ROS:                                                                                                                                       As per HPI.   Blood pressure (!) 160/96, pulse 71, temperature 97.9 F (36.6 C),  temperature source Oral, resp. rate 16, height 5' (1.524 m), weight 49 kg, SpO2 97 %.   General Examination:                                                                                                       Physical Exam  HEENT-  [place]/AT   Lungs- Respirations unlabored Extremities- No edema  Neurological Examination Mental Status: Awake and alert. Oriented to Saturday. When asked for the month and the year, she continues to perseverate with "Saturday" despite numerous attempts to redirect. She also perseverates when the category is changed and she is asked to orient to the city and the state. Speech with several phonemic paraphasias during the course of the interview, as well as halting quality. Repetition intact. Had difficulty naming several objects, coming up with similar-sounding but incorrect word-like utterances.  Cranial Nerves: II: Temporal visual fields intact without extinction to DSS,. PERRL.   III,IV, VI: No ptosis. EOMI without nystagmus.  V: Temp sensation equal bilaterally VII: Subtle right facial droop VIII: Hearing intact to voice IX,X: No hypophonia XI: Symmetric XII: Subtle deviation of tongue to the right Motor: RUE 4/5 deltoid and biceps, otherwise 5/5 LUE 5/5 proximally and distally No pronator drift. Positive rotating hands test on the right.  RLE 5/5 LLE 5/5 Sensory: Decreased temp sensation to LUE. FT intact x 4. No extinction to DSS.  Deep Tendon Reflexes: 2+ and symmetric brachioradialis and biceps. Unable to elicit patellar reflexes.  Cerebellar: No ataxia with FNF bilaterally  Gait: Deferred   Lab Results: Basic Metabolic Panel: Recent Labs  Lab 11/04/20 1938 11/05/20 0503  NA 129* 130*  K 3.9 3.3*  CL 91* 96*  CO2 28 27  GLUCOSE 465* 402*  BUN 11 10  CREATININE 0.79 0.62  CALCIUM 8.8* 8.1*    CBC: Recent Labs  Lab 11/04/20 1824 11/05/20 0503  WBC 6.4 7.7  NEUTROABS 3.5  --   HGB 14.9 12.9  HCT 43.4 36.7  MCV 91.6 91.1  PLT 431*  417*    Cardiac Enzymes: No  results for input(s): CKTOTAL, CKMB, CKMBINDEX, TROPONINI in the last 168 hours.  Lipid Panel: Recent Labs  Lab 11/05/20 0503  CHOL 162  TRIG 100  HDL 35*  CHOLHDL 4.6  VLDL 20  LDLCALC 107*    Imaging: DG Chest 2 View  Result Date: 11/04/2020 CLINICAL DATA:  Altered mental status EXAM: CHEST - 2 VIEW COMPARISON:  10/25/2020 FINDINGS: The heart size and mediastinal contours are within normal limits. Both lungs are clear. The visualized skeletal structures are unremarkable. Clips in the left upper quadrant IMPRESSION: No active cardiopulmonary disease. Electronically Signed   By: Donavan Foil M.D.   On: 11/04/2020 21:01   CT Head Wo Contrast  Result Date: 11/04/2020 CLINICAL DATA:  Altered mental status. EXAM: CT HEAD WITHOUT CONTRAST TECHNIQUE: Contiguous axial images were obtained from the base of the skull through the vertex without intravenous contrast. COMPARISON:  March 13, 2020 FINDINGS: Brain: There is mild cerebral atrophy with widening of the extra-axial spaces and ventricular dilatation. There are areas of decreased attenuation within the white matter tracts of the supratentorial brain, consistent with microvascular disease changes. A large area of cortical and white matter low attenuation is seen within the left occipital lobe. This represents a new finding when compared to the prior study. Mass effect is seen on the adjacent sulci. No midline shift is identified. A stable area of cortical encephalomalacia, with chronic adjacent white matter low attenuation, is seen within the cerebellum on the left. Chronic bilateral basal ganglia lacunar infarcts are seen. No acute intracranial or extracranial hemorrhage is identified. Vascular: No hyperdense vessel or unexpected calcification. Skull: Normal. Negative for fracture or focal lesion. Sinuses/Orbits: No acute finding. Other: None. IMPRESSION: 1. Large, acute left occipital lobe infarct. MRI correlation  is recommended. 2. Chronic bilateral basal ganglia lacunar infarcts. 3. Chronic left cerebellar infarct. Electronically Signed   By: Virgina Norfolk M.D.   On: 11/04/2020 21:04   MR BRAIN WO CONTRAST  Result Date: 11/05/2020 CLINICAL DATA:  Initial evaluation for neuro deficit, stroke suspected. EXAM: MRI HEAD WITHOUT CONTRAST TECHNIQUE: Multiplanar, multiecho pulse sequences of the brain and surrounding structures were obtained without intravenous contrast. COMPARISON:  Prior CT from 11/04/2020. FINDINGS: Brain: Generalized age-related cerebral atrophy. Patchy T2/FLAIR hyperintensity within the periventricular and deep white matter both cerebral hemispheres most consistent with chronic small vessel ischemic disease, moderate in nature. Remote cortical/subcortical infarct present at the anterior right frontal lobe. Prominent remote left PICA territory infarct noted involving the inferior left cerebellum. Remote lacunar infarcts present about the basal ganglia. Large confluent area of diffusion abnormality involving the left temporal occipital region measuring 8.2 x 4.1 cm consistent with an evolving left PCA distribution infarct, acute to early subacute in appearance. Associated petechial hemorrhage without frank intraparenchymal hematoma or hemorrhagic transformation. No significant regional mass effect. No other evidence for acute or subacute ischemia. Gray-white matter differentiation otherwise maintained. No other areas of acute or chronic intracranial hemorrhage. No mass lesion, midline shift or mass effect. No hydrocephalus or extra-axial fluid collection. Pituitary gland suprasellar region within normal limits. Midline structures intact. Vascular: Major intracranial vascular flow voids are maintained. Skull and upper cervical spine: Craniocervical junction within normal limits. Bone marrow signal intensity within normal limits. No scalp soft tissue abnormality. Sinuses/Orbits: Globes and orbital soft  tissues within normal limits. Paranasal sinuses are clear. No mastoid effusion. Other: None. IMPRESSION: 1. Moderately large evolving acute to early subacute left PCA distribution infarct. Associated petechial hemorrhage without frank intraparenchymal hematoma or hemorrhagic transformation.  2. No other acute intracranial abnormality. 3. Underlying age-related cerebral atrophy with moderate chronic small vessel ischemic disease, with multiple additional remote infarcts as above. Electronically Signed   By: Jeannine Boga M.D.   On: 11/05/2020 00:26     Assessment: 60 year old female with AMS, dysarthria and dysphasia which started acutely approximately 2-3 days prior to presentation 1. Exam reveals aphasia and mild right sided facial and upper extremity weakness.  2. CT head: Large, acute left occipital lobe infarct. Chronic bilateral basal ganglia lacunar infarcts. Chronic left cerebellar infarct. 3. MRI brain: Moderately large evolving acute to early subacute left PCA distribution infarct. Associated petechial hemorrhage without frank intraparenchymal hematoma or hemorrhagic transformation. Underlying age-related cerebral atrophy with moderate chronic small vessel ischemic disease, with multiple additional remote infarcts. 4. EKG: Normal sinus rhythm, Right atrial enlargement, Rightward axis, Pulmonary disease pattern, Nonspecific ST and T wave abnormality 5. Carotid ultrasound: Right carotid artery system: Less than 50% stenosis secondary to mild multifocal atherosclerotic plaque formation. Left carotid artery system: Less than 50% stenosis secondary to mild multifocal atherosclerotic plaque formation. Vertebral artery system: Patent with antegrade flow bilaterally.  6. Patient states that she was noncompliant with ASA at home. Most likely her stroke was due to medication noncompliance in conjunction with her extracranial, and also possible intracranial, atherosclerotic disease.     Recommendations: 1. The patient has been restarted on her home ASA and Plavix has been added. Continue DAPT x 21 days, then just ASA thereafter, unless MRA of head shows severe intracranial atherosclerotic disease.  2. Agree with starting her on a statin 3. She is taking Depakote at a low dose at home. There is no documentation of the indication for this medication and her dysphasia precludes obtaining this information from her at this time. Continue her home dose of Depakote and clarify with family and/or Pharmacy what the indication for this medication is. With no history of seizures, it may have been prescribed for mood control.   4. MRA head (ordered) 5. TTE 6. BP management. Out of the permissive HTN time window. 7. Glycemic control 8. PT/OT/Speech.  9. Will need outpatient Neurology follow up with Dr. Manuella Ghazi at the [place] clinic after discharge.    Electronically signed: Dr. Kerney Elbe 11/05/2020, 9:51 AM

## 2020-11-05 NOTE — Evaluation (Signed)
Physical Therapy Evaluation Patient Details Name: Brittany [name] MRN: [id number] DOB: [birth year] Today's Date: 11/05/2020  History of Present Illness  Pt is a 60 y/o F admitted from home on 11/04/20 with c/o acute onset of AMS & confusion which has been ongong over the last 2-3 days with associated expressive dysphasia & dysarthria. Noncontrasted head CT scan revealed large acute left occipital lobe infarct with chronic bilateral basal ganglia lacunar infarcts and chronic left cerebellar infarct. PMH: DM2, HTN, CVA, lupus, collagen vascular disease, cancer  Clinical Impression  Pt seen for PT evaluation with pt crying re: being hungry but declines all offers for food. Pt is difficult to redirect and does not provide much PLOF/home set up information. Pt is able to ambulate to door & back of room with personal [facility] but frequently reaches for objects to stabilize herself with LUE despite PT educating her not to, pt also declines using RW but anticipate she would benefit from trying AD to improve balance & safety with mobility. Will continue to follow pt acutely to address deficits noted.        Recommendations for follow up therapy are one component of a multi-disciplinary discharge planning process, led by the attending physician.  Recommendations may be updated based on patient status, additional functional criteria and insurance authorization.  Follow Up Recommendations Home health PT;Supervision/Assistance - 24 hour    Equipment Recommendations  Rolling walker with 5" wheels    Recommendations for Other Services       Precautions / Restrictions Precautions Precautions: Fall Restrictions Weight Bearing Restrictions: No      Mobility  Bed Mobility Overal bed mobility: Independent                  Transfers Overall transfer level: Modified independent                  Ambulation/Gait Ambulation/Gait assistance: Min assist Gait Distance (Feet): 20 Feet Assistive  device: Straight cane Gait Pattern/deviations: Decreased step length - right;Decreased step length - left;Decreased stride length Gait velocity: decreased   General Gait Details: Pt frequently reaching for funiture for LUE support despite PT encouraging her to attempt gait with SPC only. PT also offered trialing RW for gait but pt declined.  Stairs            Wheelchair Mobility    Modified Rankin (Stroke Patients Only)       Balance Overall balance assessment: Needs assistance Sitting-balance support: Feet supported;No upper extremity supported Sitting balance-Leahy Scale: Normal     Standing balance support: Bilateral upper extremity supported;During functional activity Standing balance-Leahy Scale: Poor                               Pertinent Vitals/Pain Pain Assessment: No/denies pain    Home Living Family/patient expects to be discharged to:: Private residence Living Arrangements: Children Available Help at Discharge: Family;Available PRN/intermittently Type of Home: House           Additional Comments: Pt provides minimal home set up/PLOF information.    Prior Function           Comments: Ambulatory with SPC     Hand Dominance        Extremity/Trunk Assessment   Upper Extremity Assessment Upper Extremity Assessment: Defer to OT evaluation    Lower Extremity Assessment Lower Extremity Assessment: Generalized weakness       Communication   Communication: No difficulties  Cognition Arousal/Alertness: Awake/alert Behavior During Therapy:  (labile) Overall Cognitive Status: No family/caregiver present to determine baseline cognitive functioning                                 General Comments: Pt labile and crying re: being hungry but declines all offers of food/snacks from PT - nurse aware. Pt with difficulty redirected to task at hand.      General Comments General comments (skin integrity, edema, etc.): Pt  labile & difficult to redirect throughout session.    Exercises     Assessment/Plan    PT Assessment Patient needs continued PT services  PT Problem List Decreased mobility;Decreased safety awareness;Decreased activity tolerance;Decreased cognition;Decreased balance;Decreased knowledge of use of DME       PT Treatment Interventions DME instruction;Therapeutic exercise;Gait training;Balance training;Neuromuscular re-education;Stair training;Manual techniques;Modalities;Functional mobility training;Cognitive remediation;Therapeutic activities;Patient/family education    PT Goals (Current goals can be found in the Care Plan section)  Acute Rehab PT Goals Patient Stated Goal: get something to eat PT Goal Formulation: With patient Time For Goal Achievement: 11/19/20 Potential to Achieve Goals: Good    Frequency 7X/week   Barriers to discharge Decreased caregiver support      Co-evaluation               AM-PAC PT "6 Clicks" Mobility  Outcome Measure Help needed turning from your back to your side while in a flat bed without using bedrails?: None Help needed moving from lying on your back to sitting on the side of a flat bed without using bedrails?: None Help needed moving to and from a bed to a chair (including a wheelchair)?: A Little Help needed standing up from a chair using your arms (e.g., wheelchair or bedside chair)?: A Little Help needed to walk in hospital room?: A Little Help needed climbing 3-5 steps with a railing? : A Lot 6 Click Score: 19    End of Session   Activity Tolerance:  (pt limited by crying re: hunger) Patient left:  (standing in room in handoff to OT) Nurse Communication: Mobility status PT Visit Diagnosis: Unsteadiness on feet (R26.81);Difficulty in walking, not elsewhere classified (R26.2)    Time: 5597-4163 PT Time Calculation (min) (ACUTE ONLY): 10 min   Charges:   PT Evaluation $PT Eval Moderate Complexity: [place], PT, DPT 11/05/20, 12:30 PM   [name] 11/05/2020, 12:28 PM

## 2020-11-05 NOTE — TOC Progression Note (Signed)
Transition of Care [facility]) - Progression Note    Patient Details  Name: SKYLA CHAMPAGNE MRN: [id number] Date of Birth: [birth year]  Transition of Care [facility]) CM/SW [place], RN Phone Number: 11/05/2020, 3:11 PM  Clinical Narrative:   Patient lives at home with daughter.  She states she has no concerns about transporting to appointments and no concerns about getting appointments.    She was a previous client of [facility], and would like to resume services.  Sharmon Revere from [place] notified, confirmed she can take patient.  DME ordered through [employer].  TOC to follow.    Expected Discharge Plan: [place] Barriers to Discharge: Continued Medical Work up  Expected Discharge Plan and Services Expected Discharge Plan: [place]   Discharge Planning Services: CM Consult Post Acute Care Choice: [place] arrangements for the past 2 months: Single Family Home                           HH Arranged: RN, PT, OT [facility] Agency: [place] Date [place]: 11/05/20 Time HH Agency Contacted: 51 Representative spoke with at [place]: [place] ([place]) Interventions    Readmission Risk Interventions No flowsheet data found.

## 2020-11-05 NOTE — Evaluation (Signed)
Occupational Therapy Evaluation Patient Details Name: Brittany [name] MRN: [id number] DOB: [birth year] Today's Date: 11/05/2020   History of Present Illness Pt is a 60 y/o F admitted from home on 11/04/20 with c/o acute onset of AMS & confusion which has been ongong over the last 2-3 days with associated expressive dysphasia & dysarthria. Noncontrasted head CT scan revealed large acute left occipital lobe infarct with chronic bilateral basal ganglia lacunar infarcts and chronic left cerebellar infarct. PMH: DM2, HTN, CVA, lupus, collagen vascular disease, cancer   Clinical Impression   Pt greeted in room with PT, agreeable to OT evaluation however pt with poor sustained attention to task as pt continued to report she was hungry- RN aware. Pt is oriented to self, place provided 3 choices, year given three choices, not oriented to date or situation. Biographical information is limited due to pt disposition.  Pt reports her daughter lives with her. Pt presents with impairments in RUE function- AROM, MMT, FMC/coordination; LUE presents with weakness- 4/5 throughout. Pt appears with potential expressive aphasia, will continue to assess. Please see below for further details on praxis/sensation, which also appear impaired but require further testing. Pt denies diplopia, blurriness; tracking appears [facility], however pt requires further assessment of scanning, saccades, smooth pursuits. Pt unable to read clock on the wall approx 10 feet away. As stated above, pt disposition affected performance throughout evaluation. Pt would benefit from skilled OT to address noted impairments and functional limitations (see below for any additional details) in order to maximize safety and independence while minimizing falls risk and caregiver burden.  Upon hospital discharge, recommend pt discharge to [facility] with 24/7 care to maximize safety and return to PLOF. Pt is left in bed with lunch tray, NAD, all needs met. OT will continue to  follow while admitted.      Recommendations for follow up therapy are one component of a multi-disciplinary discharge planning process, led by the attending physician.  Recommendations may be updated based on patient status, additional functional criteria and insurance authorization.   Follow Up Recommendations  Home health OT;Supervision/Assistance - 24 hour    Equipment Recommendations       Recommendations for Other Services       Precautions / Restrictions Precautions Precautions: Fall Restrictions Weight Bearing Restrictions: No      Mobility Bed Mobility Overal bed mobility: Independent                  Transfers Overall transfer level: Needs assistance   Transfers: Sit to/from Stand Sit to Stand: Min assist              Balance Overall balance assessment: Needs assistance Sitting-balance support: Feet supported;No upper extremity supported Sitting balance-Leahy Scale: Normal     Standing balance support: Bilateral upper extremity supported;During functional activity Standing balance-Leahy Scale: Poor                             ADL either performed or assessed with clinical judgement   ADL Overall ADL's : Needs assistance/impaired Eating/Feeding: Minimal assistance Eating/Feeding Details (indicate cue type and reason): for cutting food, tray set up- pt identifying knife as a spoon Grooming: Wash/dry face;Wash/dry hands;Minimal assistance;Sitting Grooming Details (indicate cue type and reason): sitting at EOB, pt refused standing Upper Body Bathing: Minimal assistance;Sitting       Upper Body Dressing : Minimal assistance;Sitting       Toilet Transfer: Minimal assistance Toilet Transfer Details (indicate  cue type and reason): simulated         Functional mobility during ADLs: Minimal assistance General ADL Comments: with cane     Vision Patient Visual Report: Other (comment) Additional Comments: pt reports no diplopia,  blurring; unable to read clock on the wall; assessment limited due to pt demenor, will benefit from further visual testing     Perception     Praxis Praxis Praxis-Other Comments: identifying knife as a spoon    Pertinent Vitals/Pain Pain Assessment: No/denies pain     Hand Dominance Right   Extremity/Trunk Assessment Upper Extremity Assessment Upper Extremity Assessment: RUE deficits/detail;LUE deficits/detail RUE Deficits / Details: RUE AROM: shoulder flexion 3/4 full AROM, elbow 3/4 full AROM, wrist 3/4 full AROM, fingers full AROM. RUE PROM WFL.; MMT 3/5 throughout RUE Sensation:  (pt unable to perform on this date due to perseveration; will continue to assess) RUE Coordination: decreased fine motor;decreased gross motor LUE Deficits / Details: LUE MMT grossly 4/5 throughout; LUE Sensation:  (will continue to assess) LUE Coordination: decreased fine motor   Lower Extremity Assessment Lower Extremity Assessment: Generalized weakness       Communication Communication Communication: Expressive difficulties (will continue to assess)   Cognition Arousal/Alertness: Awake/alert Behavior During Therapy: Agitated;Anxious Overall Cognitive Status: No family/caregiver present to determine baseline cognitive functioning                                 General Comments: Pt with poor sustained attention to task; perseveration re: hunger, food tray   General Comments  Pt labile & difficult to redirect throughout session.    Exercises     Shoulder Instructions      Home Living Family/patient expects to be discharged to:: Private residence Living Arrangements: Children Available Help at Discharge: Family;Available PRN/intermittently Type of Home: House Home Access: Stairs to enter                         Additional Comments: Pt with poor attention to task at hand on this date, minimal PLOF, home set up provided      Prior Functioning/Environment           Comments: Ambulatory with [facility]        OT Problem List: Decreased strength;Impaired vision/perception;Decreased knowledge of use of DME or AE;Impaired tone;Decreased range of motion;Decreased coordination;Decreased knowledge of precautions;Impaired sensation;Impaired balance (sitting and/or standing);Decreased safety awareness;Decreased cognition;Impaired UE functional use      OT Treatment/Interventions: Self-care/ADL training;Visual/perceptual remediation/compensation;Manual therapy;Therapeutic exercise;Modalities;Patient/family education;Neuromuscular education;Splinting;Balance training;Therapeutic activities;DME and/or AE instruction;Cognitive remediation/compensation    OT Goals(Current goals can be found in the care plan section) Acute Rehab OT Goals Patient Stated Goal: to eat OT Goal Formulation: With patient  OT Frequency: Min 3X/week   Barriers to D/C:            Co-evaluation              AM-PAC OT "6 Clicks" Daily Activity     Outcome Measure Help from another person eating meals?: A Little Help from another person taking care of personal grooming?: A Little Help from another person toileting, which includes using toliet, bedpan, or urinal?: A Little Help from another person bathing (including washing, rinsing, drying)?: A Little Help from another person to put on and taking off regular upper body clothing?: A Little Help from another person to put on and taking off regular lower body clothing?:  A Little 6 Click Score: 18   End of Session Equipment Utilized During Treatment: Other (comment) (cane)  Activity Tolerance: Treatment limited secondary to agitation Patient left: in bed;with bed alarm set;with call bell/phone within reach  OT Visit Diagnosis: Unsteadiness on feet (R26.81);Other abnormalities of gait and mobility (R26.89)                Time: 1119-1150 OT Time Calculation (min): 31 min Charges:  OT General Charges $OT Visit: 1 Visit OT  Treatments $Self Care/Home Management : 8-22 mins Shanon Payor, OTD OTR/L  11/05/20, 1:13 PM

## 2020-11-05 NOTE — Progress Notes (Addendum)
PROGRESS NOTE  Brittany [name] KXF:[id number] DOB: 11-05-1960 DOA: 11/04/2020 PCP: Clinic, [facility]   LOS: 1 day   Brief narrative: Brittany [name] is a 60 y.o. female with medical history significant for type II diabetes mellitus, hypertension, CVA, lupus and collagen vascular disease, presented to hospital with altered mental status and confusion for 2 to 3 days associated with expressive dysphagia and dysarthria, blurred vision and numbness and weakness of her extremities.  In the ED patient was noted to have stable vitals.  EKG showed normal sinus rhythm.  CT head scan done in the ED showed large acute left occipital lobe infarct.  Patient was then admitted to the hospital for further evaluation and treatment..   Assessment/Plan:  Active Problems:   Acute CVA (cerebrovascular accident) ([place])  Acute left occipital lobe cerebral infarction. Continue to monitor.  On Plavix and aspirin continue statins.  Neurology has been consulted.  Follow PT OT speech therapy.  Lipid panel noted.  Hemoglobin A1c pending  Uncontrolled type 2 diabetes mellitus with hyperglycemia. Continue basal bolus and sliding scale insulin.  Closely monitor  Essential hypertension. Continue Lotensin and Coreg with permissive parameters.  Dyslipidemia. Continue statins.  Restless leg syndrome. Continue Requip  Pancreatic insufficiency, status post pancreatectomy. Continue pancreatic supplements  Peripheral neuropathy. Continue Neurontin.  Depression. Continue Wellbutrin XL.  Hypokalemia.  Replenished orally.  Check levels in a.m.  DVT prophylaxis: enoxaparin (LOVENOX) injection 40 mg Start: 11/04/20 2200  Code Status: Full code  Family Communication: None  Status is: Inpatient  Remains inpatient appropriate because:Ongoing diagnostic testing needed not appropriate for outpatient work up, IV treatments appropriate due to intensity of illness or inability to take PO, and Inpatient level of care  appropriate due to severity of illness  Dispo: The patient is from: Home              Anticipated d/c is to: Home with home PT, pending PT evaluation              Patient currently is not medically stable to d/c.   Difficult to place patient No  Consultants: Neurology  Procedures: None  Anti-infectives:  None  Anti-infectives (From admission, onward)    None       Subjective: Today, patient was seen and examined at bedside.  Today patient said her left hand feels better.  Numbness has improved.  Speech appears to improved.  Objective: Vitals:   11/05/20 0620 11/05/20 0722  BP: (!) 164/89 (!) 160/96  Pulse: 70 71  Resp: 18 16  Temp: 98.5 F (36.9 C) 97.9 F (36.6 C)  SpO2: 100% 97%    Intake/Output Summary (Last 24 hours) at 11/05/2020 1050 Last data filed at 11/05/2020 0400 Gross per 24 hour  Intake 654 ml  Output --  Net 654 ml   Filed Weights   11/05/20 0127  Weight: 49 kg   Body mass index is 21.09 kg/m.   Physical Exam: GENERAL: Patient is alert awake and oriented. Not in obvious distress. HENT: No scleral pallor or icterus. Pupils equally reactive to light. Oral mucosa is moist NECK: is supple, no gross swelling noted. CHEST: Clear to auscultation. No crackles or wheezes.  Diminished breath sounds bilaterally. CVS: S1 and S2 heard, no murmur. Regular rate and rhythm.  ABDOMEN: Soft, non-tender, bowel sounds are present. EXTREMITIES: No edema. CNS: Mild dysarthria.  Moves all extremities. SKIN: warm and dry without rashes.  Data Review: I have personally reviewed the following laboratory data and studies,  CBC: Recent Labs  Lab 11/04/20 1824 11/05/20 0503  WBC 6.4 7.7  NEUTROABS 3.5  --   HGB 14.9 12.9  HCT 43.4 36.7  MCV 91.6 91.1  PLT 431* 779*   Basic Metabolic Panel: Recent Labs  Lab 11/04/20 1938 11/05/20 0503  NA 129* 130*  K 3.9 3.3*  CL 91* 96*  CO2 28 27  GLUCOSE 465* 402*  BUN 11 10  CREATININE 0.79 0.62  CALCIUM  8.8* 8.1*   Liver Function Tests: Recent Labs  Lab 11/04/20 1938  AST 16  ALT 12  ALKPHOS 103  BILITOT 0.9  PROT 8.1  ALBUMIN 3.4*   No results for input(s): LIPASE, AMYLASE in the last 168 hours. No results for input(s): AMMONIA in the last 168 hours. Cardiac Enzymes: No results for input(s): CKTOTAL, CKMB, CKMBINDEX, TROPONINI in the last 168 hours. BNP (last 3 results) No results for input(s): BNP in the last 8760 hours.  ProBNP (last 3 results) No results for input(s): PROBNP in the last 8760 hours.  CBG: Recent Labs  Lab 11/04/20 1805 11/05/20 0808  GLUCAP 544* 363*   Recent Results (from the past 240 hour(s))  Resp Panel by RT-PCR (Flu A&B, Covid) Nasopharyngeal Swab     Status: None   Collection Time: 11/04/20  9:42 PM   Specimen: Nasopharyngeal Swab; Nasopharyngeal(NP) swabs in vial transport medium  Result Value Ref Range Status   SARS Coronavirus 2 by RT PCR NEGATIVE NEGATIVE Final    Comment: (NOTE) SARS-CoV-2 target nucleic acids are NOT DETECTED.  The SARS-CoV-2 RNA is generally detectable in upper respiratory specimens during the acute phase of infection. The lowest concentration of SARS-CoV-2 viral copies this assay can detect is 138 copies/mL. A negative result does not preclude SARS-Cov-2 infection and should not be used as the sole basis for treatment or other patient management decisions. A negative result may occur with  improper specimen collection/handling, submission of specimen other than nasopharyngeal swab, presence of viral mutation(s) within the areas targeted by this assay, and inadequate number of viral copies(<138 copies/mL). A negative result must be combined with clinical observations, patient history, and epidemiological information. The expected result is Negative.  Fact Sheet for Patients:  EntrepreneurPulse.com.au  Fact Sheet for Healthcare Providers:  IncredibleEmployment.be  This test  is no t yet approved or cleared by the [place] FDA and  has been authorized for detection and/or diagnosis of SARS-CoV-2 by FDA under an Emergency Use Authorization (EUA). This EUA will remain  in effect (meaning this test can be used) for the duration of the COVID-19 declaration under Section 564(b)(1) of the Act, 21 U.S.C.section 360bbb-3(b)(1), unless the authorization is terminated  or revoked sooner.       Influenza A by PCR NEGATIVE NEGATIVE Final   Influenza B by PCR NEGATIVE NEGATIVE Final    Comment: (NOTE) The Xpert Xpress SARS-CoV-2/FLU/RSV plus assay is intended as an aid in the diagnosis of influenza from Nasopharyngeal swab specimens and should not be used as a sole basis for treatment. Nasal washings and aspirates are unacceptable for Xpert Xpress SARS-CoV-2/FLU/RSV testing.  Fact Sheet for Patients: EntrepreneurPulse.com.au  Fact Sheet for Healthcare Providers: IncredibleEmployment.be  This test is not yet approved or cleared by the [place] FDA and has been authorized for detection and/or diagnosis of SARS-CoV-2 by FDA under an Emergency Use Authorization (EUA). This EUA will remain in effect (meaning this test can be used) for the duration of the COVID-19 declaration under Section 564(b)(1) of the Act,  21 U.S.C. section 360bbb-3(b)(1), unless the authorization is terminated or revoked.  Performed at [facility], [address] [place], [place] [address]      Studies: DG Chest 2 View  Result Date: 11/04/2020 CLINICAL DATA:  Altered mental status EXAM: CHEST - 2 VIEW COMPARISON:  10/25/2020 FINDINGS: The heart size and mediastinal contours are within normal limits. Both lungs are clear. The visualized skeletal structures are unremarkable. Clips in the left upper quadrant IMPRESSION: No active cardiopulmonary disease. Electronically Signed   By: Donavan Foil M.D.   On: 11/04/2020 21:01   CT Head Wo  Contrast  Result Date: 11/04/2020 CLINICAL DATA:  Altered mental status. EXAM: CT HEAD WITHOUT CONTRAST TECHNIQUE: Contiguous axial images were obtained from the base of the skull through the vertex without intravenous contrast. COMPARISON:  March 13, 2020 FINDINGS: Brain: There is mild cerebral atrophy with widening of the extra-axial spaces and ventricular dilatation. There are areas of decreased attenuation within the white matter tracts of the supratentorial brain, consistent with microvascular disease changes. A large area of cortical and white matter low attenuation is seen within the left occipital lobe. This represents a new finding when compared to the prior study. Mass effect is seen on the adjacent sulci. No midline shift is identified. A stable area of cortical encephalomalacia, with chronic adjacent white matter low attenuation, is seen within the cerebellum on the left. Chronic bilateral basal ganglia lacunar infarcts are seen. No acute intracranial or extracranial hemorrhage is identified. Vascular: No hyperdense vessel or unexpected calcification. Skull: Normal. Negative for fracture or focal lesion. Sinuses/Orbits: No acute finding. Other: None. IMPRESSION: 1. Large, acute left occipital lobe infarct. MRI correlation is recommended. 2. Chronic bilateral basal ganglia lacunar infarcts. 3. Chronic left cerebellar infarct. Electronically Signed   By: Virgina Norfolk M.D.   On: 11/04/2020 21:04   MR BRAIN WO CONTRAST  Result Date: 11/05/2020 CLINICAL DATA:  Initial evaluation for neuro deficit, stroke suspected. EXAM: MRI HEAD WITHOUT CONTRAST TECHNIQUE: Multiplanar, multiecho pulse sequences of the brain and surrounding structures were obtained without intravenous contrast. COMPARISON:  Prior CT from 11/04/2020. FINDINGS: Brain: Generalized age-related cerebral atrophy. Patchy T2/FLAIR hyperintensity within the periventricular and deep white matter both cerebral hemispheres most consistent  with chronic small vessel ischemic disease, moderate in nature. Remote cortical/subcortical infarct present at the anterior right frontal lobe. Prominent remote left PICA territory infarct noted involving the inferior left cerebellum. Remote lacunar infarcts present about the basal ganglia. Large confluent area of diffusion abnormality involving the left temporal occipital region measuring 8.2 x 4.1 cm consistent with an evolving left PCA distribution infarct, acute to early subacute in appearance. Associated petechial hemorrhage without frank intraparenchymal hematoma or hemorrhagic transformation. No significant regional mass effect. No other evidence for acute or subacute ischemia. Gray-white matter differentiation otherwise maintained. No other areas of acute or chronic intracranial hemorrhage. No mass lesion, midline shift or mass effect. No hydrocephalus or extra-axial fluid collection. Pituitary gland suprasellar region within normal limits. Midline structures intact. Vascular: Major intracranial vascular flow voids are maintained. Skull and upper cervical spine: Craniocervical junction within normal limits. Bone marrow signal intensity within normal limits. No scalp soft tissue abnormality. Sinuses/Orbits: Globes and orbital soft tissues within normal limits. Paranasal sinuses are clear. No mastoid effusion. Other: None. IMPRESSION: 1. Moderately large evolving acute to early subacute left PCA distribution infarct. Associated petechial hemorrhage without frank intraparenchymal hematoma or hemorrhagic transformation. 2. No other acute intracranial abnormality. 3. Underlying age-related cerebral atrophy with moderate  chronic small vessel ischemic disease, with multiple additional remote infarcts as above. Electronically Signed   By: Jeannine Boga M.D.   On: 11/05/2020 00:26      Flora Lipps, MD  [facility] 11/05/2020  If 7PM-7AM, please contact night-coverage

## 2020-11-05 NOTE — Progress Notes (Signed)
Patient's mood continues to be very labile in nature with frequent crying episodes. Patient reactions to situations are dramatically increased compared to the expected normal response. Patient unable to be redirected from her current obsession with food because she is "starving." Diet order modified by MD to allow for more choices. Patient still continues to respond erratically.

## 2020-11-05 NOTE — Progress Notes (Signed)
Initial Nutrition Assessment  DOCUMENTATION CODES:   Not applicable  INTERVENTION:   -Glucerna Shake po TID, each supplement provides 220 kcal and 10 grams of protein  -MVI with minerals daily  NUTRITION DIAGNOSIS:   Increased nutrient needs related to chronic illness (CVA) as evidenced by estimated needs.  GOAL:   Patient will meet greater than or equal to 90% of their needs  MONITOR:   PO intake, Supplement acceptance, Labs, Weight trends, Skin, I & O's  REASON FOR ASSESSMENT:   Malnutrition Screening Tool    ASSESSMENT:   Brittany [name] is a 60 y.o. African-American female with medical history significant for type II diabetes mellitus, hypertension, CVA, lupus and collagen vascular disease, who presented to the ER with acute onset of altered mental status with confusion which has been going on over the last 2 to 3 days with associated expressive dysphasia and dysarthria.  She admitted to right-sided headache without dizziness or blurred vision.  She feels her right upper and lower extremity are slightly weaker.  She uses a cane to ambulate and will still able to use it. No tinnitus or vertigo. No urinary or stool incontinence.  No witnessed seizures. No chest pain or palpitations.   No cough or wheezing or dyspnea.    She denies any dysuria, urinary frequency or urgency or flank pain.  The patient admits to not being compliant with aspirin despite having history of previous CVA.  Pt admitted with acute lt occipital lobe cerebral infarction.   Reviewed I/O's: +654 ml x 24 hours  Pt unavailable at time of visit. Attempted to speak with pt via call to hospital room phone, however, unable to reach. RD unable to obtain further nutrition-related history or complete nutrition-focused physical exam at this time.    Pt awaiting neurology consult.   Pt currently on a carb modified diet; no meal completion data currently available to review. Per RN notes, pt very hungry.   Reviewed  wt hx; wt has been stable over the past year.   Medications reviewed and include creon, folvite, remeron, and 0.9% sodium chloride infusion @ 100 ml/hr.   Lab Results  Component Value Date   HGBA1C 13.5 (H) 07/13/2019   PTA DM medications are 28 units insulin NPH- regular BID and 100 mg metformin daily.   Labs reviewed: CBGS: [id number] (inpatient orders for glycemic control are 0-20 units insulin aspart TID before meals and at bedtime).    Diet Order:   Diet Order             Diet Carb Modified Fluid consistency: Thin; Room service appropriate? Yes  Diet effective now                   EDUCATION NEEDS:   No education needs have been identified at this time  Skin:  Skin Assessment: Reviewed RN Assessment  Last BM:  11/05/20 (type 5)  Height:   Ht Readings from Last 1 Encounters:  11/05/20 5' (1.524 m)    Weight:   Wt Readings from Last 1 Encounters:  11/05/20 49 kg    Ideal Body Weight:  45.5 kg  BMI:  Body mass index is 21.09 kg/m.  Estimated Nutritional Needs:   Kcal:  1450-1650  Protein:  70-85 grams  Fluid:  > 1.4 L    Loistine Chance, RD, LDN, [place] Registered Dietitian II Certified Diabetes Care and Education Specialist Please refer to [facility] for RD and/or RD on-call/weekend/after hours pager

## 2020-11-05 NOTE — Progress Notes (Signed)
Patient continues to be emotionally labile throughout shift. Frequent tearful episodes. Patient continues to request extra food multiple times throughout the shift. When asked about her food situation at home, patient becomes tearful and advises she "doesn't want to talk about it." Of note, patient has consumed two extra sandwich trays and an array of snacks in addition to her meals.   Patient has difficulty concentrating and frequently becomes fixated on thoughts. Although she does get frustrated about her difficulty with word finding and expressive aphasia, her mood does not seem to be affected by this and she is easily redirected.

## 2020-11-06 ENCOUNTER — Inpatient Hospital Stay
Admit: 2020-11-06 | Discharge: 2020-11-06 | Disposition: A | Discharge status: Home / Self Care | Payer: Medicare Other | Attending: Family Medicine | Admitting: Family Medicine

## 2020-11-06 ENCOUNTER — Inpatient Hospital Stay: Payer: Medicare Other

## 2020-11-06 LAB — CBC
HCT: 39.6 % (ref 36.0–46.0)
Hemoglobin: 13.8 g/dL (ref 12.0–15.0)
MCH: 31.7 pg (ref 26.0–34.0)
MCHC: 34.8 g/dL (ref 30.0–36.0)
MCV: 91 fL (ref 80.0–100.0)
Platelets: 450 10*3/uL — ABNORMAL HIGH (ref 150–400)
RBC: 4.35 MIL/uL (ref 3.87–5.11)
RDW: 13.7 % (ref 11.5–15.5)
WBC: 10.2 10*3/uL (ref 4.0–10.5)
nRBC: 0 % (ref 0.0–0.2)

## 2020-11-06 LAB — ECHOCARDIOGRAM COMPLETE
AR max vel: 3.03 cm2
AV Area VTI: 2.49 cm2
AV Area mean vel: 2.76 cm2
AV Mean grad: 2 mmHg
AV Peak grad: 4 mmHg
Ao pk vel: 1 m/s
Area-P 1/2: 3.12 cm2
Calc EF: 61.8 %
Height: 60 in
S' Lateral: 2 cm
Single Plane A2C EF: 57.5 %
Single Plane A4C EF: 60.5 %
Weight: 1727.7 oz

## 2020-11-06 LAB — MAGNESIUM: Magnesium: 1.7 mg/dL (ref 1.7–2.4)

## 2020-11-06 LAB — BASIC METABOLIC PANEL
Anion gap: 6 (ref 5–15)
BUN: 12 mg/dL (ref 6–20)
CO2: 31 mmol/L (ref 22–32)
Calcium: 9.1 mg/dL (ref 8.9–10.3)
Chloride: 99 mmol/L (ref 98–111)
Creatinine, Ser: 0.61 mg/dL (ref 0.44–1.00)
GFR, Estimated: >60 mL/min (ref >60)
Glucose, Bld: 115 mg/dL — ABNORMAL HIGH (ref 70–99)
Potassium: 3.3 mmol/L — ABNORMAL LOW (ref 3.5–5.1)
Sodium: 136 mmol/L (ref 135–145)

## 2020-11-06 LAB — GLUCOSE, CAPILLARY
Glucose-Capillary: 195 mg/dL — ABNORMAL HIGH (ref 70–99)
Glucose-Capillary: 220 mg/dL — ABNORMAL HIGH (ref 70–99)
Glucose-Capillary: 147 mg/dL — ABNORMAL HIGH (ref 70–99)
Glucose-Capillary: 172 mg/dL — ABNORMAL HIGH (ref 70–99)

## 2020-11-06 MED ORDER — POTASSIUM CHLORIDE CRYS ER 20 MEQ PO TBCR
40.0000 meq | EXTENDED_RELEASE_TABLET | Freq: Once | ORAL | Status: AC
  Administered 2020-11-06: 40 meq via ORAL
  Filled 2020-11-06: qty 2

## 2020-11-06 MED ORDER — HALOPERIDOL LACTATE 5 MG/ML IJ SOLN: 1.0000 mg | Freq: Four times a day (QID) | INTRAMUSCULAR | Status: DC | PRN

## 2020-11-06 MED ORDER — TRAMADOL HCL 50 MG PO TABS
25.0000 mg | ORAL_TABLET | Freq: Four times a day (QID) | ORAL | Status: DC | PRN
  Administered 2020-11-07: 21:00:00 25 mg via ORAL
  Filled 2020-11-06 (×2): qty 1

## 2020-11-06 NOTE — Plan of Care (Signed)
This patient has had a change in orientation compared to last night and the day before. She is very confused, not making sense, words are jibberish, not following commands, cursing - requested that Dr Sidney Ace come see the pt.

## 2020-11-06 NOTE — Progress Notes (Addendum)
MRA head was normal.   TTE shows LVEF of 65 to 70%. The left ventricle has normal function. No mural thrombus or valvular vegetation described in the report.   Stroke work up is complete. No changes to plan in my last note. Neurohospitalist service will follow PRN. Please call if there are additional questions.   Electronically signed: Dr. Kerney Elbe
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
## 2020-11-06 NOTE — Progress Notes (Signed)
PROGRESS NOTE  Brittany [name] OVF:[id number] DOB: 1960/03/21 DOA: 11/04/2020 PCP: Clinic, [facility]   LOS: 2 days   Brief narrative: Brittany [name] is a 60 y.o. female with medical history significant for type II diabetes mellitus, hypertension, CVA, lupus and collagen vascular disease, presented to hospital with altered mental status and confusion for 2 to 3 days associated with expressive dysphagia and dysarthria, blurred vision and numbness and weakness of her extremities.  In the ED, patient was noted to have stable vitals.  EKG showed normal sinus rhythm.  CT head scan done in the ED showed large acute left occipital lobe infarct.  Patient was then admitted to the hospital for further evaluation and treatment..   Assessment/Plan:  Active Problems:   Acute CVA (cerebrovascular accident) ([place])  Acute left occipital lobe cerebral infarction aphasia and right-sided weakness. Continue to monitor.  On Plavix and aspirin, continue statins.  Neurology has been consulted.   PT OT speech therapy recommending outpatient PT OT and speech therapy.  Patient still has expressive aphasia and appears to be more confused today.  We will closely monitor..  Lipid panel noted.  Hemoglobin A1c pending will reorder again today.  2D echocardiogram pending.  Mild confusion compared to yesterday.  We will hold off with Atarax and trazodone.  Decrease the dose of tramadol.  Uncontrolled type 2 diabetes mellitus with hyperglycemia. Continue basal bolus and sliding scale insulin.  Closely monitor.  Check hemoglobin A1c  Essential hypertension. Continue Lotensin and Coreg with permissive parameters.  Avoid sudden low blood pressure.  Dyslipidemia. Continue statins.  Restless leg syndrome. Continue Requip  Pancreatic insufficiency, status post pancreatectomy. Continue pancreatic supplements  Peripheral neuropathy. Continue Neurontin.  Depression. Continue Wellbutrin XL.  Hypokalemia.  Replenished  orally.  Check levels in a.m.  DVT prophylaxis: enoxaparin (LOVENOX) injection 40 mg Start: 11/04/20 2200  Code Status: Full code  Family Communication: None  Status is: Inpatient  Remains inpatient appropriate because:Ongoing diagnostic testing needed not appropriate for outpatient work up, IV treatments appropriate due to intensity of illness or inability to take PO, and Inpatient level of care appropriate due to severity of illness  Dispo: The patient is from: Home              Anticipated d/c is to: Home with home PT in 1 to 2 days.              Patient currently is not medically stable to d/c.   Difficult to place patient No  Consultants: Neurology  Procedures: None  Anti-infectives:  None  Anti-infectives (From admission, onward)    None      Subjective: Today patient was seen and examined at bedside.  Feels frustrated and has aphasia.  Patient also has trouble swallowing and appears to be more confused as per the nursing staff.  Objective: Vitals:   11/06/20 0445 11/06/20 0739  BP: 105/78 103/82  Pulse: 89 87  Resp: 15 16  Temp: 98.7 F (37.1 C) 98.4 F (36.9 C)  SpO2: 100% 96%    Intake/Output Summary (Last 24 hours) at 11/06/2020 1350 Last data filed at 11/06/2020 1027 Gross per 24 hour  Intake 717 ml  Output --  Net 717 ml    Filed Weights   11/05/20 0127  Weight: 49 kg   Body mass index is 21.09 kg/m.   Physical Exam: GENERAL: Patient is alert awake, appears frustrated with aphasia and dysarthria, not in obvious distress. HENT: No scleral pallor or icterus. Pupils  equally reactive to light. Oral mucosa is moist NECK: is supple, no gross swelling noted. CHEST: Clear to auscultation. No crackles or wheezes.  Diminished breath sounds bilaterally. CVS: S1 and S2 heard, no murmur. Regular rate and rhythm.  ABDOMEN: Soft, non-tender, bowel sounds are present. EXTREMITIES: No edema. CNS: Mild dysarthria, aphasia.  Right-sided facial droop,  moves all extremities, mild right upper extremity weakness SKIN: warm and dry without rashes.  Data Review: I have personally reviewed the following laboratory data and studies,  CBC: Recent Labs  Lab 11/04/20 1824 11/05/20 0503 11/06/20 0453  WBC 6.4 7.7 10.2  NEUTROABS 3.5  --   --   HGB 14.9 12.9 13.8  HCT 43.4 36.7 39.6  MCV 91.6 91.1 91.0  PLT 431* 417* 450*    Basic Metabolic Panel: Recent Labs  Lab 11/04/20 1938 11/05/20 0503 11/06/20 0453  NA 129* 130* 136  K 3.9 3.3* 3.3*  CL 91* 96* 99  CO2 28 27 31   GLUCOSE 465* 402* 115*  BUN 11 10 12   CREATININE 0.79 0.62 0.61  CALCIUM 8.8* 8.1* 9.1  MG  --   --  1.7    Liver Function Tests: Recent Labs  Lab 11/04/20 1938  AST 16  ALT 12  ALKPHOS 103  BILITOT 0.9  PROT 8.1  ALBUMIN 3.4*    No results for input(s): LIPASE, AMYLASE in the last 168 hours. No results for input(s): AMMONIA in the last 168 hours. Cardiac Enzymes: No results for input(s): CKTOTAL, CKMB, CKMBINDEX, TROPONINI in the last 168 hours. BNP (last 3 results) No results for input(s): BNP in the last 8760 hours.  ProBNP (last 3 results) No results for input(s): PROBNP in the last 8760 hours.  CBG: Recent Labs  Lab 11/05/20 1243 11/05/20 1607 11/05/20 2108 11/06/20 0743 11/06/20 1205  GLUCAP 209* 155* 253* 195* 220*    Recent Results (from the past 240 hour(s))  Resp Panel by RT-PCR (Flu A&B, Covid) Nasopharyngeal Swab     Status: None   Collection Time: 11/04/20  9:42 PM   Specimen: Nasopharyngeal Swab; Nasopharyngeal(NP) swabs in vial transport medium  Result Value Ref Range Status   SARS Coronavirus 2 by RT PCR NEGATIVE NEGATIVE Final    Comment: (NOTE) SARS-CoV-2 target nucleic acids are NOT DETECTED.  The SARS-CoV-2 RNA is generally detectable in upper respiratory specimens during the acute phase of infection. The lowest concentration of SARS-CoV-2 viral copies this assay can detect is 138 copies/mL. A negative result  does not preclude SARS-Cov-2 infection and should not be used as the sole basis for treatment or other patient management decisions. A negative result may occur with  improper specimen collection/handling, submission of specimen other than nasopharyngeal swab, presence of viral mutation(s) within the areas targeted by this assay, and inadequate number of viral copies(<138 copies/mL). A negative result must be combined with clinical observations, patient history, and epidemiological information. The expected result is Negative.  Fact Sheet for Patients:  EntrepreneurPulse.com.au  Fact Sheet for Healthcare Providers:  IncredibleEmployment.be  This test is no t yet approved or cleared by the [place] FDA and  has been authorized for detection and/or diagnosis of SARS-CoV-2 by FDA under an Emergency Use Authorization (EUA). This EUA will remain  in effect (meaning this test can be used) for the duration of the COVID-19 declaration under Section 564(b)(1) of the Act, 21 U.S.C.section 360bbb-3(b)(1), unless the authorization is terminated  or revoked sooner.       Influenza A by PCR NEGATIVE  NEGATIVE Final   Influenza B by PCR NEGATIVE NEGATIVE Final    Comment: (NOTE) The Xpert Xpress SARS-CoV-2/FLU/RSV plus assay is intended as an aid in the diagnosis of influenza from Nasopharyngeal swab specimens and should not be used as a sole basis for treatment. Nasal washings and aspirates are unacceptable for Xpert Xpress SARS-CoV-2/FLU/RSV testing.  Fact Sheet for Patients: EntrepreneurPulse.com.au  Fact Sheet for Healthcare Providers: IncredibleEmployment.be  This test is not yet approved or cleared by the [place] FDA and has been authorized for detection and/or diagnosis of SARS-CoV-2 by FDA under an Emergency Use Authorization (EUA). This EUA will remain in effect (meaning this test can be used) for  the duration of the COVID-19 declaration under Section 564(b)(1) of the Act, 21 U.S.C. section 360bbb-3(b)(1), unless the authorization is terminated or revoked.  Performed at [facility], [address] [place], [place] [address]       Studies: DG Chest 2 View  Result Date: 11/04/2020 CLINICAL DATA:  Altered mental status EXAM: CHEST - 2 VIEW COMPARISON:  10/25/2020 FINDINGS: The heart size and mediastinal contours are within normal limits. Both lungs are clear. The visualized skeletal structures are unremarkable. Clips in the left upper quadrant IMPRESSION: No active cardiopulmonary disease. Electronically Signed   By: Donavan Foil M.D.   On: 11/04/2020 21:01   CT Head Wo Contrast  Result Date: 11/04/2020 CLINICAL DATA:  Altered mental status. EXAM: CT HEAD WITHOUT CONTRAST TECHNIQUE: Contiguous axial images were obtained from the base of the skull through the vertex without intravenous contrast. COMPARISON:  March 13, 2020 FINDINGS: Brain: There is mild cerebral atrophy with widening of the extra-axial spaces and ventricular dilatation. There are areas of decreased attenuation within the white matter tracts of the supratentorial brain, consistent with microvascular disease changes. A large area of cortical and white matter low attenuation is seen within the left occipital lobe. This represents a new finding when compared to the prior study. Mass effect is seen on the adjacent sulci. No midline shift is identified. A stable area of cortical encephalomalacia, with chronic adjacent white matter low attenuation, is seen within the cerebellum on the left. Chronic bilateral basal ganglia lacunar infarcts are seen. No acute intracranial or extracranial hemorrhage is identified. Vascular: No hyperdense vessel or unexpected calcification. Skull: Normal. Negative for fracture or focal lesion. Sinuses/Orbits: No acute finding. Other: None. IMPRESSION: 1. Large, acute left occipital lobe  infarct. MRI correlation is recommended. 2. Chronic bilateral basal ganglia lacunar infarcts. 3. Chronic left cerebellar infarct. Electronically Signed   By: Virgina Norfolk M.D.   On: 11/04/2020 21:04   MR ANGIO HEAD WO CONTRAST  Result Date: 11/06/2020 CLINICAL DATA:  Follow-up examination for stroke. EXAM: MRA HEAD WITHOUT CONTRAST TECHNIQUE: Angiographic images of the Circle of Willis were acquired using MRA technique without intravenous contrast. COMPARISON:  MRI from 11/05/2020. FINDINGS: Anterior circulation: Visualized portions of the distal cervical internal carotid arteries are widely patent with symmetric antegrade flow. Petrous, cavernous, and supraclinoid segments widely patent without stenosis or other abnormality. Origins of the ophthalmic arteries patent. A1 segments patent bilaterally. Normal anterior communicating artery complex. Anterior cerebral arteries patent to their distal aspects without stenosis. No M1 stenosis or occlusion. Normal MCA bifurcations. Distal MCA branches well perfused and symmetric. Posterior circulation: Both vertebral arteries patent to the vertebrobasilar junction without stenosis. Left vertebral artery dominant. Both PICA origins patent and normal. Basilar patent to its distal aspect without stenosis. Superior cerebellar arteries patent bilaterally. Both PCA supplied via  the basilar as well as small bilateral posterior communicating arteries. Visualized PCAs patent to their distal aspects without stenosis. Anatomic variants: None significant. Other: No intracranial aneurysm. IMPRESSION: Normal intracranial MRA. Electronically Signed   By: Jeannine Boga M.D.   On: 11/06/2020 02:10   MR BRAIN WO CONTRAST  Result Date: 11/05/2020 CLINICAL DATA:  Initial evaluation for neuro deficit, stroke suspected. EXAM: MRI HEAD WITHOUT CONTRAST TECHNIQUE: Multiplanar, multiecho pulse sequences of the brain and surrounding structures were obtained without intravenous  contrast. COMPARISON:  Prior CT from 11/04/2020. FINDINGS: Brain: Generalized age-related cerebral atrophy. Patchy T2/FLAIR hyperintensity within the periventricular and deep white matter both cerebral hemispheres most consistent with chronic small vessel ischemic disease, moderate in nature. Remote cortical/subcortical infarct present at the anterior right frontal lobe. Prominent remote left PICA territory infarct noted involving the inferior left cerebellum. Remote lacunar infarcts present about the basal ganglia. Large confluent area of diffusion abnormality involving the left temporal occipital region measuring 8.2 x 4.1 cm consistent with an evolving left PCA distribution infarct, acute to early subacute in appearance. Associated petechial hemorrhage without frank intraparenchymal hematoma or hemorrhagic transformation. No significant regional mass effect. No other evidence for acute or subacute ischemia. Gray-white matter differentiation otherwise maintained. No other areas of acute or chronic intracranial hemorrhage. No mass lesion, midline shift or mass effect. No hydrocephalus or extra-axial fluid collection. Pituitary gland suprasellar region within normal limits. Midline structures intact. Vascular: Major intracranial vascular flow voids are maintained. Skull and upper cervical spine: Craniocervical junction within normal limits. Bone marrow signal intensity within normal limits. No scalp soft tissue abnormality. Sinuses/Orbits: Globes and orbital soft tissues within normal limits. Paranasal sinuses are clear. No mastoid effusion. Other: None. IMPRESSION: 1. Moderately large evolving acute to early subacute left PCA distribution infarct. Associated petechial hemorrhage without frank intraparenchymal hematoma or hemorrhagic transformation. 2. No other acute intracranial abnormality. 3. Underlying age-related cerebral atrophy with moderate chronic small vessel ischemic disease, with multiple additional  remote infarcts as above. Electronically Signed   By: Jeannine Boga M.D.   On: 11/05/2020 00:26   US Carotid Bilateral (at [facility] and [facility] only)  Result Date: 11/05/2020 CLINICAL DATA:  60 year old female with history of stroke. EXAM: BILATERAL CAROTID DUPLEX ULTRASOUND TECHNIQUE: Pearline Cables scale imaging, color Doppler and duplex ultrasound were performed of bilateral carotid and vertebral arteries in the neck. COMPARISON:  07/12/2020 FINDINGS: Criteria: Quantification of carotid stenosis is based on velocity parameters that correlate the residual internal carotid diameter with NASCET-based stenosis levels, using the diameter of the distal internal carotid lumen as the denominator for stenosis measurement. The following velocity measurements were obtained: RIGHT ICA: Peak systolic velocity 74 cm/sec, End diastolic velocity 28 cm/sec CCA: Peak systolic velocity 55 cm/sec SYSTOLIC ICA/CCA RATIO:  1.6 ECA: Peak systolic velocity 81 cm/sec LEFT ICA: Peak systolic velocity 67 cm/sec, End diastolic velocity 28 cm/sec CCA: 57 cm/sec SYSTOLIC ICA/CCA RATIO:  1.2 ECA: 95 cm/sec RIGHT CAROTID ARTERY: Mild multifocal atherosclerotic plaque formation, most prominent in the distal common carotid artery. No significant tortuosity. Normal low resistance waveforms. RIGHT VERTEBRAL ARTERY:  Antegrade flow. LEFT CAROTID ARTERY: Mild multifocal atherosclerotic plaque formation. No significant tortuosity. Normal low resistance waveforms. LEFT VERTEBRAL ARTERY:  Antegrade flow. Upper extremity non-invasive blood pressures: Not obtained. IMPRESSION: 1. Right carotid artery system: Less than 50% stenosis secondary to mild multifocal atherosclerotic plaque formation. 2. Left carotid artery system: Less than 50% stenosis secondary to mild multifocal atherosclerotic plaque formation. 3.  Vertebral artery system: Patent with antegrade  flow bilaterally. Ruthann Cancer, MD [facility]  Electronically Signed   By: Ruthann Cancer M.D.   On: 11/05/2020 11:34      Flora Lipps, MD  [facility] 11/06/2020  If 7PM-7AM, please contact night-coverage

## 2020-11-06 NOTE — Progress Notes (Signed)
Physical Therapy Treatment Patient Details Name: Brittany [name] MRN: [id number] DOB: [birth year] Today's Date: 11/06/2020   History of Present Illness Pt is a 60 y/o F admitted from home on 11/04/20 with c/o acute onset of AMS & confusion which has been ongong over the last 2-3 days with associated expressive dysphasia & dysarthria. Noncontrasted head CT scan revealed large acute left occipital lobe infarct with chronic bilateral basal ganglia lacunar infarcts and chronic left cerebellar infarct. PMH: DM2, HTN, CVA, lupus, collagen vascular disease, cancer    PT Comments    Pt seen for PT tx with pt requiring encouragement to initiate movement but is able to complete bed mobility independently, sit<>stand with supervision, and ambulate 1 lap around nurses station with RW & supervision. Pt demonstrates R inattention & frequent cuing for obstacle avoidance. Pt demonstrates more nonsensical words/sentences on this date compared to yesterday - nurse & MD made aware. Pt declined any additional mobility during session.    Recommendations for follow up therapy are one component of a multi-disciplinary discharge planning process, led by the attending physician.  Recommendations may be updated based on patient status, additional functional criteria and insurance authorization.  Follow Up Recommendations  Home health PT;Supervision/Assistance - 24 hour     Equipment Recommendations  Rolling walker with 5" wheels    Recommendations for Other Services       Precautions / Restrictions Precautions Precautions: Fall Precaution Comments: R inattention Restrictions Weight Bearing Restrictions: No     Mobility  Bed Mobility Overal bed mobility: Independent                  Transfers Overall transfer level: Needs assistance Equipment used: Rolling walker (2 wheeled) Transfers: Sit to/from Stand Sit to Stand: Supervision            Ambulation/Gait Ambulation/Gait assistance:  Supervision Gait Distance (Feet): 170 Feet Assistive device: Rolling walker (2 wheeled)       General Gait Details: forward trunk lean onto RW, R inattention, min/mod cuing for directional turns & obstacle avoidance   Stairs             Wheelchair Mobility    Modified Rankin (Stroke Patients Only)       Balance Overall balance assessment: Needs assistance Sitting-balance support: Feet supported;No upper extremity supported Sitting balance-Leahy Scale: Normal     Standing balance support: Bilateral upper extremity supported;During functional activity Standing balance-Leahy Scale: Fair Standing balance comment: BUE support on RW                            Cognition Arousal/Alertness: Awake/alert Behavior During Therapy: Flat affect;Impulsive Overall Cognitive Status: No family/caregiver present to determine baseline cognitive functioning                                 General Comments: Follows 1 step commands inconsistently, requires multimodal cuing throughout session. R inattention & no awareness. Difficulty communicating (either doesn't answer PT or answers & words/sentences are nonsensical - MD & nurse made aware)      Exercises      General Comments        Pertinent Vitals/Pain Pain Assessment: No/denies pain    Home Living                      Prior Function  PT Goals (current goals can now be found in the care plan section) Acute Rehab PT Goals Patient Stated Goal: none stated PT Goal Formulation: With patient Time For Goal Achievement: 11/19/20 Potential to Achieve Goals: Good Progress towards PT goals: Progressing toward goals    Frequency    7X/week      PT Plan Current plan remains appropriate    Co-evaluation              AM-PAC PT "6 Clicks" Mobility   Outcome Measure  Help needed turning from your back to your side while in a flat bed without using bedrails?: None Help  needed moving from lying on your back to sitting on the side of a flat bed without using bedrails?: None Help needed moving to and from a bed to a chair (including a wheelchair)?: A Little Help needed standing up from a chair using your arms (e.g., wheelchair or bedside chair)?: A Little Help needed to walk in hospital room?: A Little Help needed climbing 3-5 steps with a railing? : A Little 6 Click Score: 20    End of Session   Activity Tolerance: Patient tolerated treatment well Patient left: in bed;with bed alarm set;with call bell/phone within reach   PT Visit Diagnosis: Unsteadiness on feet (R26.81);Difficulty in walking, not elsewhere classified (R26.2)     Time: 5027-7412 PT Time Calculation (min) (ACUTE ONLY): 9 min  Charges:  $Therapeutic Activity: 8-22 mins                     Lavone Nian, PT, DPT 11/06/20, 1:26 PM    [name] 11/06/2020, 1:25 PM

## 2020-11-07 LAB — CBC
HCT: 40 % (ref 36.0–46.0)
Hemoglobin: 13.4 g/dL (ref 12.0–15.0)
MCH: 30.9 pg (ref 26.0–34.0)
MCHC: 33.5 g/dL (ref 30.0–36.0)
MCV: 92.2 fL (ref 80.0–100.0)
Platelets: 467 10*3/uL — ABNORMAL HIGH (ref 150–400)
RBC: 4.34 MIL/uL (ref 3.87–5.11)
RDW: 14 % (ref 11.5–15.5)
WBC: 6.7 10*3/uL (ref 4.0–10.5)
nRBC: 0 % (ref 0.0–0.2)

## 2020-11-07 LAB — GLUCOSE, CAPILLARY
Glucose-Capillary: 285 mg/dL — ABNORMAL HIGH (ref 70–99)
Glucose-Capillary: 259 mg/dL — ABNORMAL HIGH (ref 70–99)
Glucose-Capillary: 64 mg/dL — ABNORMAL LOW (ref 70–99)
Glucose-Capillary: 71 mg/dL (ref 70–99)
Glucose-Capillary: 203 mg/dL — ABNORMAL HIGH (ref 70–99)

## 2020-11-07 LAB — BASIC METABOLIC PANEL
Anion gap: 9 (ref 5–15)
BUN: 14 mg/dL (ref 6–20)
CO2: 29 mmol/L (ref 22–32)
Calcium: 8.8 mg/dL — ABNORMAL LOW (ref 8.9–10.3)
Chloride: 96 mmol/L — ABNORMAL LOW (ref 98–111)
Creatinine, Ser: 0.67 mg/dL (ref 0.44–1.00)
GFR, Estimated: >60 mL/min (ref >60)
Glucose, Bld: 304 mg/dL — ABNORMAL HIGH (ref 70–99)
Potassium: 3.7 mmol/L (ref 3.5–5.1)
Sodium: 134 mmol/L — ABNORMAL LOW (ref 135–145)

## 2020-11-07 LAB — HEMOGLOBIN A1C
Hgb A1c MFr Bld: >15.5 % — ABNORMAL HIGH (ref 4.8–5.6)
Mean Plasma Glucose: >398 mg/dL

## 2020-11-07 LAB — MAGNESIUM: Magnesium: 1.6 mg/dL — ABNORMAL LOW (ref 1.7–2.4)

## 2020-11-07 MED ORDER — INSULIN DETEMIR 100 UNIT/ML ~~LOC~~ SOLN
10.0000 [IU] | Freq: Two times a day (BID) | SUBCUTANEOUS | Status: DC
  Filled 2020-11-07 (×3): qty 0.1

## 2020-11-07 MED ORDER — INSULIN DETEMIR 100 UNIT/ML ~~LOC~~ SOLN
10.0000 [IU] | Freq: Two times a day (BID) | SUBCUTANEOUS | Status: DC
  Administered 2020-11-08: 08:00:00 10 [IU] via SUBCUTANEOUS
  Filled 2020-11-07 (×2): qty 0.1

## 2020-11-07 MED ORDER — MAGNESIUM OXIDE -MG SUPPLEMENT 400 (240 MG) MG PO TABS
400.0000 mg | ORAL_TABLET | Freq: Two times a day (BID) | ORAL | Status: DC
  Administered 2020-11-07 – 2020-11-08 (×3): 400 mg via ORAL
  Filled 2020-11-07 (×3): qty 1

## 2020-11-07 MED ORDER — INSULIN ASPART 100 UNIT/ML IJ SOLN
3.0000 [IU] | Freq: Three times a day (TID) | INTRAMUSCULAR | Status: DC
  Administered 2020-11-08 (×2): 3 [IU] via SUBCUTANEOUS
  Filled 2020-11-07 (×2): qty 1

## 2020-11-07 NOTE — Progress Notes (Signed)
In and out done per MD order, 200 ml ouput

## 2020-11-07 NOTE — Progress Notes (Addendum)
Inpatient Diabetes Program Recommendations  AACE/ADA: New Consensus Statement on Inpatient Glycemic Control (2015)  Target Ranges:  Prepandial:   less than 140 mg/dL      Peak postprandial:   less than 180 mg/dL (1-2 hours)      Critically ill patients:  140 - 180 mg/dL   Lab Results  Component Value Date   GLUCAP 259 (H) 11/07/2020   HGBA1C 13.5 (H) 07/13/2019    Review of Glycemic Control  Diabetes history: DM2 Outpatient Diabetes medications: 70/30 28 units bid Current orders for Inpatient glycemic control: Novolog 0-20 units qid  Inpatient Diabetes Program Recommendations:   -Add Levemir 10 units bid -Decrease Novolog hs correction to 0-5 units -Add Novolog 3 units tid meal coverage if eats 50% Secure chat sent to Dr. Starla Link.  Thank you, Nani Gasser. Raahim Shartzer, RN, MSN, CDE  Diabetes Coordinator Inpatient Glycemic Control Team Team Pager [phone] (8am-5pm) 11/07/2020 1:25 PM

## 2020-11-07 NOTE — Progress Notes (Signed)
Occupational Therapy Treatment Patient Details Name: Brittany [name] MRN: [id number] DOB: 21-Nov-1960 Today's Date: 11/07/2020   History of present illness Pt is a 60 y/o F admitted from home on 11/04/20 with c/o acute onset of AMS & confusion which has been ongong over the last 2-3 days with associated expressive dysphasia & dysarthria. Noncontrasted head CT scan revealed large acute left occipital lobe infarct with chronic bilateral basal ganglia lacunar infarcts and chronic left cerebellar infarct. PMH: DM2, HTN, CVA, lupus, collagen vascular disease, cancer   OT comments  Pt seen for OT treatment on this date. Upon arrival to room, pt asleep in bed, however easily awoken following light touch. Difficult to assess cognition in setting of pt's expressive aphasia; pt frequently using word "home" to request items/assistance, however able to answer y/n questions consistently. Per phone call with daughter during session, pt's daughter reporting pt's communication/cognition different and requesting to speak to MD/RN (MD/RN informed via secure chat at end of session). In addition to expressive aphasia, pt continues to present with dyspraxia and decreased balance and requires MIN A to perform standing grooming tasks and MIN A to perform seated UB dressing. Of note, ADL tools (i.e., toothbrush, toothpaste, washcloth) placed within pt's R visual field this date and no R inattention detected this date; will continue to assess in functional contexts in subsequent sessions. Pt continues to benefit from skilled OT services to maximize return to PLOF and minimize risk of future falls, injury, caregiver burden, and readmission. Will continue to follow POC. Discharge recommendation updated to SNF to reflect complexity of pt's impairments at this time.    Recommendations for follow up therapy are one component of a multi-disciplinary discharge planning process, led by the attending physician.  Recommendations may be updated  based on patient status, additional functional criteria and insurance authorization.    Follow Up Recommendations  SNF    Equipment Recommendations  Other (comment) (defer to next venue of care)       Precautions / Restrictions Precautions Precautions: Fall Precaution Comments: R inattention Restrictions Weight Bearing Restrictions: No       Mobility Bed Mobility Overal bed mobility: Needs Assistance Bed Mobility: Sit to Supine;Supine to Sit     Supine to sit: Min guard Sit to supine: Min guard        Transfers Overall transfer level: Needs assistance Equipment used: Rolling walker (2 wheeled) Transfers: Sit to/from Stand Sit to Stand: Min guard         General transfer comment: MIN GUARD d/t impulsivity and decreased standing balance    Balance Overall balance assessment: Needs assistance Sitting-balance support: Feet supported;No upper extremity supported Sitting balance-Leahy Scale: Good Sitting balance - Comments: Good sitting balance at EOB during UB dressing   Standing balance support: Single extremity supported;During functional activity Standing balance-Leahy Scale: Fair Standing balance comment: with unilateral UE support from counter during standing grooming tasks, pt requires MIN GUARD                           ADL either performed or assessed with clinical judgement   ADL Overall ADL's : Needs assistance/impaired     Grooming: Wash/dry face;Oral care;Minimal assistance;Standing Grooming Details (indicate cue type and reason): Following set-up assist, pt able to complete standing grooming tasks, requiring MIN GUARD for standing balance and MIN A to apply toothpaste on correct end of toothbrush         Upper Body Dressing : Minimal assistance;Sitting  Upper Body Dressing Details (indicate cue type and reason): to don/doff hospital gown Lower Body Dressing: Min guard;Sitting/lateral leans Lower Body Dressing Details (indicate cue  type and reason): to don/doff socks                      Cognition Arousal/Alertness: Awake/alert Behavior During Therapy: WFL for tasks assessed/performed Overall Cognitive Status: Difficult to assess                                 General Comments: Difficult to assess cognition in setting of pt's expressive aphasia; pt frequently using word "home" to request items/assistance, however able to answer y/n questions consistently. Pt appearing to have difficultly remembering how to use phone to call daughter; OT assisted pt in calling pt's daughter through y/n questions and once able to talk to daughter, daughter reporting pt's communication/cognition different and requesting to speak to MD/RN (MD/RN informed via secure chat). Pt requires MIN GUARD at all times d/t impulsivity.                   Pertinent Vitals/ Pain       Pain Assessment: No/denies pain         Frequency  Min 2X/week        Progress Toward Goals  OT Goals(current goals can now be found in the care plan section)  Progress towards OT goals: Progressing toward goals  Acute Rehab OT Goals Patient Stated Goal: to go home OT Goal Formulation: With patient  Plan Discharge plan needs to be updated;Frequency needs to be updated       AM-PAC OT "6 Clicks" Daily Activity     Outcome Measure   Help from another person eating meals?: A Little Help from another person taking care of personal grooming?: A Little Help from another person toileting, which includes using toliet, bedpan, or urinal?: A Little Help from another person bathing (including washing, rinsing, drying)?: A Little Help from another person to put on and taking off regular upper body clothing?: A Little Help from another person to put on and taking off regular lower body clothing?: A Little 6 Click Score: 18    End of Session    OT Visit Diagnosis: Unsteadiness on feet (R26.81);Other abnormalities of gait and mobility  (R26.89)   Activity Tolerance Patient tolerated treatment well   Patient Left in bed;with bed alarm set;with call bell/phone within reach   Nurse Communication Mobility status;Other (comment) (daughter's request to speak to RN/MD)        Time: 1323-1350 OT Time Calculation (min): 27 min  Charges: OT General Charges $OT Visit: 1 Visit OT Treatments $Self Care/Home Management : 23-37 mins  Fredirick Maudlin, OTR/L [place]

## 2020-11-07 NOTE — Plan of Care (Signed)
  Patient is able to speak in  sentences now, still slurred but able to communicate her needs. Patient asked for another blanket to keep her warm and a cup of --she said mice but I knew she was trying to say ice.  Patient started crying, this nurse provided reassurance and comfort to her.  I said welcome back Brittany [name]!!  Patient is less confused now.Able to follow simple commands. (See NIHSS) -  assessment similar in comparison from when patient was admitted to this floor. f

## 2020-11-07 NOTE — Progress Notes (Addendum)
Physical Therapy Treatment Patient Details Name: Brittany [name] MRN: [id number] DOB: 1960/10/14 Today's Date: 11/07/2020   History of Present Illness Pt is a 60 y/o F admitted from home on 11/04/20 with c/o acute onset of AMS & confusion which has been ongong over the last 2-3 days with associated expressive dysphasia & dysarthria. Noncontrasted head CT scan revealed large acute left occipital lobe infarct with chronic bilateral basal ganglia lacunar infarcts and chronic left cerebellar infarct. PMH: DM2, HTN, CVA, lupus, collagen vascular disease, cancer    PT Comments    Pt lethargic upon arrival, requiring verbal and tactile encouragement for participation. Patient's functional status changed from previous session requiring MIN A for ambulation w/ RW due to strong R inattention and lack of environmental awareness. LOB x 1 during amb requiring PT assist for recovery. Pt does demonstrate increase in aphasia using nonsensical, fluent communication (asking for "pepper" but meaning the word "ice") in which pt becomes slightly agitated with communication difficulty. MD and nursing notified of functional changes. Discharge recommendations are being updated to SNF due to changes in functional mobility. Skilled PT intervention is indicated to address deficits in function, mobility, and to return to PLOF as able.     Recommendations for follow up therapy are one component of a multi-disciplinary discharge planning process, led by the attending physician.  Recommendations may be updated based on patient status, additional functional criteria and insurance authorization.  Follow Up Recommendations  Supervision for mobility/OOB;SNF     Equipment Recommendations  Rolling walker with 5" wheels    Recommendations for Other Services       Precautions / Restrictions Precautions Precautions: Fall Precaution Comments: R inattention Restrictions Weight Bearing Restrictions: No     Mobility  Bed  Mobility Overal bed mobility: Needs Assistance Bed Mobility: Sit to Supine;Supine to Sit     Supine to sit: Min assist Sit to supine: Modified independent (Device/Increase time)   General bed mobility comments: MIN A for trunk, partically for encouragement; Sit > supine INDEP, able to scoot to [facility] with bed rails and bridging    Transfers Overall transfer level: Needs assistance Equipment used: Rolling walker (2 wheeled) Transfers: Sit to/from Stand Sit to Stand: Min guard         General transfer comment: Close min-gaurd due to R inattention, pt grabs L side of RW, tactile cues to move hand to R side  Ambulation/Gait Ambulation/Gait assistance: Min Web designer (Feet): 40 Feet Assistive device: Rolling walker (2 wheeled) Gait Pattern/deviations: Decreased step length - right;Decreased step length - left;Decreased stride length;Narrow base of support Gait velocity: decreased   General Gait Details: LOB x 1, recovered with ankle strategy & PT assist; multimodal cues for turning and environmental awareness including manuevering RW turing to the [employer]             Wheelchair Mobility    Modified Rankin (Stroke Patients Only)       Balance Overall balance assessment: Needs assistance Sitting-balance support: Feet supported;No upper extremity supported Sitting balance-Leahy Scale: Normal     Standing balance support: Bilateral upper extremity supported;During functional activity Standing balance-Leahy Scale: Fair Standing balance comment: LOB x1 during dynamic task, requiers BUE support for static stance                            Cognition Arousal/Alertness: Awake/alert Behavior During Therapy: WFL for tasks assessed/performed Overall Cognitive Status: No family/caregiver present to determine  baseline cognitive functioning                                 General Comments: Intermittent receptive aphasia i.e., asking for  "pepper" instead of ice and becomes slightly agitated w/ response. Requires redirection of task, follows one step commands inconsistently      Exercises      General Comments        Pertinent Vitals/Pain Pain Assessment: Faces Faces Pain Scale: Hurts little more Pain Location: Head Pain Descriptors / Indicators: Discomfort Pain Intervention(s): Limited activity within patient's tolerance;Monitored during session;Repositioned    Home Living                      Prior Function            PT Goals (current goals can now be found in the care plan section) Progress towards PT goals: Progressing toward goals    Frequency    7X/week      PT Plan Discharge plan needs to be updated    Co-evaluation              AM-PAC PT "6 Clicks" Mobility   Outcome Measure  Help needed turning from your back to your side while in a flat bed without using bedrails?: None Help needed moving from lying on your back to sitting on the side of a flat bed without using bedrails?: A Little Help needed moving to and from a bed to a chair (including a wheelchair)?: A Little Help needed standing up from a chair using your arms (e.g., wheelchair or bedside chair)?: A Little Help needed to walk in hospital room?: A Little Help needed climbing 3-5 steps with a railing? : A Lot 6 Click Score: 18    End of Session Equipment Utilized During Treatment: Gait belt Activity Tolerance: Other (comment) (Limited by change in status)   Nurse Communication: Mobility status;Other (comment) (change in functional status) PT Visit Diagnosis: Unsteadiness on feet (R26.81);Difficulty in walking, not elsewhere classified (R26.2)     Time: 1003-1030 PT Time Calculation (min) (ACUTE ONLY): 27 min  Charges:                        [employer], SPT

## 2020-11-07 NOTE — Care Management Important Message (Signed)
Important Message  Patient Details  Name: Brittany [name] MRN: [id number] Date of Birth: 02-25-1960   Medicare Important Message Given:  Yes     Loann Quill 11/07/2020, 12:45 PM

## 2020-11-07 NOTE — Plan of Care (Signed)
Patient 's ability to manage simple normal ADL's  has diminished (could be a neuro cognitive related issue) therefore health related needs would definitely be a challenge. She is able to drink from a cup, sip from a straw with no problem, but patient seems confused when presented with a spoon.   Frustration is noted when pt is trying to communicate and words come out all jumbled and jibberish. Curse words come out clear.   Dr Sidney Ace was notified of this nurse concerns and change in patient's orientation. Stat Ct was done, and EKG. Neuro to follow up tomorrow per MD.  Due to patient's impulsivity and disregard for her own safety, pt was closely monitored throughout the shift.

## 2020-11-07 NOTE — Progress Notes (Addendum)
PROGRESS NOTE  Brittany [name]:[id number] DOB: 1960/10/18 DOA: 11/04/2020 PCP: Clinic, [facility]   LOS: 3 days   Brief narrative: Brittany [name] is a 60 y.o. female with medical history significant for type II diabetes mellitus, hypertension, CVA, lupus and collagen vascular disease, presented to hospital with altered mental status and confusion for 2 to 3 days associated with expressive dysphagia and dysarthria, blurred vision and numbness and weakness of her extremities.  In the ED, patient was noted to have stable vitals.  EKG showed normal sinus rhythm.  CT head scan done in the ED showed large acute left occipital lobe infarct.  Patient was then admitted to the hospital for further evaluation and treatment..   Assessment/Plan:  Active Problems:   Acute CVA (cerebrovascular accident) ([place])  Acute left occipital lobe cerebral infarction aphasia and right-sided weakness. Continue to monitor.  On Plavix and aspirin, continue statins.  Neurology has been consulted.   PT, OT speech therapy recommending outpatient PT OT and speech therapy but patient continues to have impaired cognition, speech and limitations.  She appears to be unsafe for discharge home..  Lipid panel noted.  Hemoglobin A1c pending.  2D echocardiogram shows LV ejection fraction of 65 to 70%.  Mild confusion   continue to hold Atarax and trazodone.  Have decreased the dose of tramadol.  Type 2 diabetes mellitus with hyperglycemia. Continue sliding scale insulin.  Closely monitor. A1c pending.  Latest POC glucose of 285.  Medical coordinator on board and adjusting insulin regimen today to long-acting 10 units twice daily and mealtime insulin.  Hypomagnesemia  We will replenish.  Check levels in a.m.  Essential hypertension. Continue Lotensin and Coreg with permissive parameters.    Dyslipidemia. Continue statins.  Restless leg syndrome. Continue Requip  Pancreatic insufficiency, status post  pancreatectomy. Continue pancreatic supplements  Peripheral neuropathy. Continue Neurontin.  Depression. Continue Wellbutrin XL.  Hypokalemia.  Improved.  DVT prophylaxis: enoxaparin (LOVENOX) injection 40 mg Start: 11/04/20 2200  Code Status: Full code  Family Communication: Spoke with the patient's daughter on the phone and updated her about the clinical condition of the patient.  Status is: Inpatient  Remains inpatient appropriate because:Ongoing diagnostic testing needed not appropriate for outpatient work up, IV treatments appropriate due to intensity of illness or inability to take PO, and Inpatient level of care appropriate due to severity of illness  Dispo: The patient is from: Home              Anticipated d/c is to: Home with home/skilled nursing facility.  TOC has been consulted.              Patient currently is not medically stable to d/c.   Difficult to place patient No  Consultants: Neurology  Procedures: None  Anti-infectives:  None  Anti-infectives (From admission, onward)    None      Subjective: Today, patient was seen and examined at bedside.  Nursing staff reported issues with cognition alertness and frustration.    Objective: Vitals:   11/07/20 0449 11/07/20 0726  BP: (!) 149/108 (!) 145/91  Pulse: 94 94  Resp: 16 20  Temp: 99 F (37.2 C) 98 F (36.7 C)  SpO2: 95% 94%    Intake/Output Summary (Last 24 hours) at 11/07/2020 1106 Last data filed at 11/07/2020 1015 Gross per 24 hour  Intake 717 ml  Output --  Net 717 ml    Filed Weights   11/05/20 0127  Weight: 49 kg   Body mass  index is 21.09 kg/m.   Physical Exam: GENERAL: Patient is alert awake, patient still has aphasia and dysarthria.  Feels agitated at times with frustration  HENT: No scleral pallor or icterus. Pupils equally reactive to light. Oral mucosa is moist NECK: is supple, no gross swelling noted. CHEST: Clear to auscultation. No crackles or wheezes.   Diminished breath sounds bilaterally. CVS: S1 and S2 heard, no murmur. Regular rate and rhythm.  ABDOMEN: Soft, non-tender, bowel sounds are present. EXTREMITIES: No edema. CNS: Mild dysarthria, aphasia.  Right-sided facial droop, moves all extremities, mild right upper extremity weakness SKIN: warm and dry without rashes.  Data Review: I have personally reviewed the following laboratory data and studies,  CBC: Recent Labs  Lab 11/04/20 1824 11/05/20 0503 11/06/20 0453 11/07/20 0456  WBC 6.4 7.7 10.2 6.7  NEUTROABS 3.5  --   --   --   HGB 14.9 12.9 13.8 13.4  HCT 43.4 36.7 39.6 40.0  MCV 91.6 91.1 91.0 92.2  PLT 431* 417* 450* 467*    Basic Metabolic Panel: Recent Labs  Lab 11/04/20 1938 11/05/20 0503 11/06/20 0453 11/07/20 0456  NA 129* 130* 136 134*  K 3.9 3.3* 3.3* 3.7  CL 91* 96* 99 96*  CO2 28 27 31 29   GLUCOSE 465* 402* 115* 304*  BUN 11 10 12 14   CREATININE 0.79 0.62 0.61 0.67  CALCIUM 8.8* 8.1* 9.1 8.8*  MG  --   --  1.7 1.6*    Liver Function Tests: Recent Labs  Lab 11/04/20 1938  AST 16  ALT 12  ALKPHOS 103  BILITOT 0.9  PROT 8.1  ALBUMIN 3.4*    No results for input(s): LIPASE, AMYLASE in the last 168 hours. No results for input(s): AMMONIA in the last 168 hours. Cardiac Enzymes: No results for input(s): CKTOTAL, CKMB, CKMBINDEX, TROPONINI in the last 168 hours. BNP (last 3 results) No results for input(s): BNP in the last 8760 hours.  ProBNP (last 3 results) No results for input(s): PROBNP in the last 8760 hours.  CBG: Recent Labs  Lab 11/06/20 0743 11/06/20 1205 11/06/20 1719 11/06/20 2047 11/07/20 0727  GLUCAP 195* 220* 147* 172* 285*    Recent Results (from the past 240 hour(s))  Resp Panel by RT-PCR (Flu A&B, Covid) Nasopharyngeal Swab     Status: None   Collection Time: 11/04/20  9:42 PM   Specimen: Nasopharyngeal Swab; Nasopharyngeal(NP) swabs in vial transport medium  Result Value Ref Range Status   SARS Coronavirus 2  by RT PCR NEGATIVE NEGATIVE Final    Comment: (NOTE) SARS-CoV-2 target nucleic acids are NOT DETECTED.  The SARS-CoV-2 RNA is generally detectable in upper respiratory specimens during the acute phase of infection. The lowest concentration of SARS-CoV-2 viral copies this assay can detect is 138 copies/mL. A negative result does not preclude SARS-Cov-2 infection and should not be used as the sole basis for treatment or other patient management decisions. A negative result may occur with  improper specimen collection/handling, submission of specimen other than nasopharyngeal swab, presence of viral mutation(s) within the areas targeted by this assay, and inadequate number of viral copies(<138 copies/mL). A negative result must be combined with clinical observations, patient history, and epidemiological information. The expected result is Negative.  Fact Sheet for Patients:  EntrepreneurPulse.com.au  Fact Sheet for Healthcare Providers:  IncredibleEmployment.be  This test is no t yet approved or cleared by the [place] FDA and  has been authorized for detection and/or diagnosis of SARS-CoV-2 by FDA  under an Emergency Use Authorization (EUA). This EUA will remain  in effect (meaning this test can be used) for the duration of the COVID-19 declaration under Section 564(b)(1) of the Act, 21 U.S.C.section 360bbb-3(b)(1), unless the authorization is terminated  or revoked sooner.       Influenza A by PCR NEGATIVE NEGATIVE Final   Influenza B by PCR NEGATIVE NEGATIVE Final    Comment: (NOTE) The Xpert Xpress SARS-CoV-2/FLU/RSV plus assay is intended as an aid in the diagnosis of influenza from Nasopharyngeal swab specimens and should not be used as a sole basis for treatment. Nasal washings and aspirates are unacceptable for Xpert Xpress SARS-CoV-2/FLU/RSV testing.  Fact Sheet for Patients: EntrepreneurPulse.com.au  Fact  Sheet for Healthcare Providers: IncredibleEmployment.be  This test is not yet approved or cleared by the [place] FDA and has been authorized for detection and/or diagnosis of SARS-CoV-2 by FDA under an Emergency Use Authorization (EUA). This EUA will remain in effect (meaning this test can be used) for the duration of the COVID-19 declaration under Section 564(b)(1) of the Act, 21 U.S.C. section 360bbb-3(b)(1), unless the authorization is terminated or revoked.  Performed at [facility], [address] [place], [place] [address]       Studies: CT HEAD WO CONTRAST (5MM)  Result Date: 11/06/2020 CLINICAL DATA:  [place] EXAM: CT HEAD WITHOUT CONTRAST TECHNIQUE: Contiguous axial images were obtained from the base of the skull through the vertex without intravenous contrast. COMPARISON:  MRI 11/05/2020.  CT 11/04/2020. FINDINGS: Brain: Evolving subacute infarct in the left posterior parietal and occipital lobes and left cerebellum. Old right frontal infarct, stable. No hemorrhage. No hydrocephalus. Chronic small vessel disease throughout the deep white matter. Vascular: No hyperdense vessel or unexpected calcification. Skull: No acute calvarial abnormality. Sinuses/Orbits: Visualized paranasal sinuses and mastoids clear. Orbital soft tissues unremarkable. Other: None IMPRESSION: Evolutionary changes in the left subacute PCA territory infarct. No hemorrhage or new infarct. Old right frontal infarct. Chronic small vessel disease throughout the deep white matter. Electronically Signed   By: Rolm Baptise M.D.   On: 11/06/2020 22:14   MR ANGIO HEAD WO CONTRAST  Result Date: 11/06/2020 CLINICAL DATA:  Follow-up examination for stroke. EXAM: MRA HEAD WITHOUT CONTRAST TECHNIQUE: Angiographic images of the Circle of Willis were acquired using MRA technique without intravenous contrast. COMPARISON:  MRI from 11/05/2020. FINDINGS: Anterior circulation: Visualized portions  of the distal cervical internal carotid arteries are widely patent with symmetric antegrade flow. Petrous, cavernous, and supraclinoid segments widely patent without stenosis or other abnormality. Origins of the ophthalmic arteries patent. A1 segments patent bilaterally. Normal anterior communicating artery complex. Anterior cerebral arteries patent to their distal aspects without stenosis. No M1 stenosis or occlusion. Normal MCA bifurcations. Distal MCA branches well perfused and symmetric. Posterior circulation: Both vertebral arteries patent to the vertebrobasilar junction without stenosis. Left vertebral artery dominant. Both PICA origins patent and normal. Basilar patent to its distal aspect without stenosis. Superior cerebellar arteries patent bilaterally. Both PCA supplied via the basilar as well as small bilateral posterior communicating arteries. Visualized PCAs patent to their distal aspects without stenosis. Anatomic variants: None significant. Other: No intracranial aneurysm. IMPRESSION: Normal intracranial MRA. Electronically Signed   By: Jeannine Boga M.D.   On: 11/06/2020 02:10   US Carotid Bilateral (at [facility] and [facility] only)  Result Date: 11/05/2020 CLINICAL DATA:  60 year old female with history of stroke. EXAM: BILATERAL CAROTID DUPLEX ULTRASOUND TECHNIQUE: Pearline Cables scale imaging, color Doppler and duplex ultrasound were performed of bilateral  carotid and vertebral arteries in the neck. COMPARISON:  07/12/2020 FINDINGS: Criteria: Quantification of carotid stenosis is based on velocity parameters that correlate the residual internal carotid diameter with NASCET-based stenosis levels, using the diameter of the distal internal carotid lumen as the denominator for stenosis measurement. The following velocity measurements were obtained: RIGHT ICA: Peak systolic velocity 74 cm/sec, End diastolic velocity 28 cm/sec CCA: Peak systolic velocity 55 cm/sec SYSTOLIC ICA/CCA RATIO:  1.6 ECA: Peak systolic  velocity 81 cm/sec LEFT ICA: Peak systolic velocity 67 cm/sec, End diastolic velocity 28 cm/sec CCA: 57 cm/sec SYSTOLIC ICA/CCA RATIO:  1.2 ECA: 95 cm/sec RIGHT CAROTID ARTERY: Mild multifocal atherosclerotic plaque formation, most prominent in the distal common carotid artery. No significant tortuosity. Normal low resistance waveforms. RIGHT VERTEBRAL ARTERY:  Antegrade flow. LEFT CAROTID ARTERY: Mild multifocal atherosclerotic plaque formation. No significant tortuosity. Normal low resistance waveforms. LEFT VERTEBRAL ARTERY:  Antegrade flow. Upper extremity non-invasive blood pressures: Not obtained. IMPRESSION: 1. Right carotid artery system: Less than 50% stenosis secondary to mild multifocal atherosclerotic plaque formation. 2. Left carotid artery system: Less than 50% stenosis secondary to mild multifocal atherosclerotic plaque formation. 3.  Vertebral artery system: Patent with antegrade flow bilaterally. Ruthann Cancer, MD [facility] Electronically Signed   By: Ruthann Cancer M.D.   On: 11/05/2020 11:34   ECHOCARDIOGRAM COMPLETE  Result Date: 11/06/2020    ECHOCARDIOGRAM REPORT   Patient Name:   JREAM BROYLES Date of Exam: 11/06/2020 Medical Rec #:  [id number]     Height:       60.0 in Accession #:    [id number]    Weight:       108.0 lb Date of Birth:  August 15, 1960    BSA:          1.436 m Patient Age:    11 years      BP:           105/78 mmHg Patient Gender: F             HR:           88 bpm. Exam Location:  [facility] Procedure: 2D Echo Indications:     Stroke  History:         Patient has prior history of Echocardiogram examinations.                  Lupus; Risk Factors:Hypertension and Diabetes.  Sonographer:     L Thornton-Maynard Referring Phys:  [id number] SWF A MANSY Diagnosing Phys: Donnelly Angelica IMPRESSIONS  1. Left ventricular ejection fraction, by estimation, is 65 to 70%. The left ventricle has normal function. The left ventricle has no regional  wall motion abnormalities. There is severe left ventricular hypertrophy. Left ventricular diastolic parameters  are consistent with Grade I diastolic dysfunction (impaired relaxation).  2. Right ventricular systolic function is normal. The right ventricular size is normal. There is normal pulmonary artery systolic pressure.  3. The mitral valve is normal in structure. No evidence of mitral valve regurgitation. No evidence of mitral stenosis.  4. The aortic valve is normal in structure. Aortic valve regurgitation is not visualized. No aortic stenosis is present.  5. The inferior vena cava is normal in size with greater than 50% respiratory variability, suggesting right atrial pressure of 3 mmHg. FINDINGS  Left Ventricle: Left ventricular ejection fraction, by estimation, is 65 to 70%. The left ventricle has normal function. The left ventricle has no regional wall motion abnormalities. The left  ventricular internal cavity size was small. There is severe left ventricular hypertrophy. Left ventricular diastolic parameters are consistent with Grade I diastolic dysfunction (impaired relaxation). Right Ventricle: The right ventricular size is normal. No increase in right ventricular wall thickness. Right ventricular systolic function is normal. There is normal pulmonary artery systolic pressure. The tricuspid regurgitant velocity is 2.52 m/s, and  with an assumed right atrial pressure of 3 mmHg, the estimated right ventricular systolic pressure is 62.7 mmHg. Left Atrium: Left atrial size was normal in size. Right Atrium: Right atrial size was normal in size. Pericardium: There is no evidence of pericardial effusion. Mitral Valve: The mitral valve is normal in structure. No evidence of mitral valve regurgitation. No evidence of mitral valve stenosis. Tricuspid Valve: The tricuspid valve is normal in structure. Tricuspid valve regurgitation is mild. Aortic Valve: The aortic valve is normal in structure. Aortic valve  regurgitation is not visualized. No aortic stenosis is present. Aortic valve mean gradient measures 2.0 mmHg. Aortic valve peak gradient measures 4.0 mmHg. Aortic valve area, by VTI measures 2.49 cm. Pulmonic Valve: The pulmonic valve was normal in structure. Pulmonic valve regurgitation is not visualized. Aorta: The aortic root is normal in size and structure. Venous: The inferior vena cava is normal in size with greater than 50% respiratory variability, suggesting right atrial pressure of 3 mmHg. IAS/Shunts: The atrial septum is grossly normal.  LEFT VENTRICLE PLAX 2D LVIDd:         3.10 cm     Diastology LVIDs:         2.00 cm     LV e' medial:    5.87 cm/s LV PW:         1.20 cm     LV E/e' medial:  11.1 LV IVS:        1.40 cm     LV e' lateral:   5.27 cm/s LVOT diam:     2.10 cm     LV E/e' lateral: 12.4 LV SV:         39 LV SV Index:   27 LVOT Area:     3.46 cm  LV Volumes (MOD) LV vol d, MOD A2C: 34.1 ml LV vol d, MOD A4C: 26.1 ml LV vol s, MOD A2C: 14.5 ml LV vol s, MOD A4C: 10.3 ml LV SV MOD A2C:     19.6 ml LV SV MOD A4C:     26.1 ml LV SV MOD BP:      20.4 ml RIGHT VENTRICLE RV S prime:     9.81 cm/s TAPSE (M-mode): 2.0 cm LEFT ATRIUM             Index LA diam:        1.80 cm 1.25 cm/m LA Vol (A2C):   26.6 ml 18.52 ml/m LA Vol (A4C):   16.0 ml 11.14 ml/m LA Biplane Vol: 22.1 ml 15.39 ml/m  AORTIC VALVE                   PULMONIC VALVE AV Area (Vmax):    3.03 cm    PV Vmax:       0.68 m/s AV Area (Vmean):   2.76 cm    PV Peak grad:  1.8 mmHg AV Area (VTI):     2.49 cm AV Vmax:           100.00 cm/s AV Vmean:          74.800 cm/s AV VTI:  0.157 m AV Peak Grad:      4.0 mmHg AV Mean Grad:      2.0 mmHg LVOT Vmax:         87.50 cm/s LVOT Vmean:        59.600 cm/s LVOT VTI:          0.113 m LVOT/AV VTI ratio: 0.72  AORTA Ao Root diam: 3.10 cm MITRAL VALVE                TRICUSPID VALVE MV Area (PHT): 3.12 cm     TR Peak grad:   25.4 mmHg MV Decel Time: 243 msec     TR Vmax:        252.00  cm/s MV E velocity: 65.10 cm/s MV A velocity: 105.00 cm/s  SHUNTS MV E/A ratio:  0.62         Systemic VTI:  0.11 m                             Systemic Diam: 2.10 cm Donnelly Angelica Electronically signed by Donnelly Angelica Signature Date/Time: 11/06/2020/4:39:20 PM    Final       Flora Lipps, MD  [facility] 11/07/2020  If 7PM-7AM, please contact night-coverage

## 2020-11-08 ENCOUNTER — Encounter: Payer: Self-pay | Admitting: Family Medicine

## 2020-11-08 LAB — BASIC METABOLIC PANEL
Anion gap: 13 (ref 5–15)
BUN: 13 mg/dL (ref 6–20)
CO2: 28 mmol/L (ref 22–32)
Calcium: 8.8 mg/dL — ABNORMAL LOW (ref 8.9–10.3)
Chloride: 95 mmol/L — ABNORMAL LOW (ref 98–111)
Creatinine, Ser: 0.45 mg/dL (ref 0.44–1.00)
GFR, Estimated: >60 mL/min (ref >60)
Glucose, Bld: 199 mg/dL — ABNORMAL HIGH (ref 70–99)
Potassium: 3.9 mmol/L (ref 3.5–5.1)
Sodium: 136 mmol/L (ref 135–145)

## 2020-11-08 LAB — CBC
HCT: 39 % (ref 36.0–46.0)
Hemoglobin: 13.2 g/dL (ref 12.0–15.0)
MCH: 30.6 pg (ref 26.0–34.0)
MCHC: 33.8 g/dL (ref 30.0–36.0)
MCV: 90.5 fL (ref 80.0–100.0)
Platelets: 453 10*3/uL — ABNORMAL HIGH (ref 150–400)
RBC: 4.31 MIL/uL (ref 3.87–5.11)
RDW: 14.1 % (ref 11.5–15.5)
WBC: 11.5 10*3/uL — ABNORMAL HIGH (ref 4.0–10.5)
nRBC: 0.2 % (ref 0.0–0.2)

## 2020-11-08 LAB — GLUCOSE, CAPILLARY
Glucose-Capillary: 256 mg/dL — ABNORMAL HIGH (ref 70–99)
Glucose-Capillary: 159 mg/dL — ABNORMAL HIGH (ref 70–99)

## 2020-11-08 MED ORDER — CLOPIDOGREL BISULFATE 75 MG PO TABS: 75.0000 mg | ORAL_TABLET | Freq: Every day | ORAL | 0 refills | Status: AC

## 2020-11-08 MED ORDER — MAGNESIUM OXIDE -MG SUPPLEMENT 400 (240 MG) MG PO TABS
400.0000 mg | ORAL_TABLET | Freq: Two times a day (BID) | ORAL | 0 refills | Status: DC
Start: 2020-11-08 — End: 2020-11-19

## 2020-11-08 MED ORDER — MIRTAZAPINE 15 MG PO TABS: 15.0000 mg | ORAL_TABLET | Freq: Every day | ORAL | 0 refills | Status: DC

## 2020-11-08 MED ORDER — ATORVASTATIN CALCIUM 80 MG PO TABS: 80.0000 mg | ORAL_TABLET | Freq: Every evening | ORAL | 2 refills | Status: DC

## 2020-11-08 NOTE — TOC Progression Note (Signed)
Transition of Care [facility]) - Progression Note    Patient Details  Name: Brittany [name] MRN: [id number] Date of Birth: 12-16-1960  Transition of Care [id number]) CM/SW [place], RN Phone Number: 11/08/2020, 10:10 AM  Clinical Narrative:   Spoke to patient's daughter this AM.  She states that her mother lives with her, but she has a toddler and a 49 month old at home.  She can be there most of the time, but cannot guarantee 24/7 supervision.  Also, daughter will need to return to work in the next 1-2 months, she is just holding off to assist her mom at this time  Discussing with care team as well [place] vs SNF.  Daughter can accommodate either, but thinks perhaps SNF can help patient be at optimal health upon return home.  Daughter will discuss and follow up with care team.  TOC contact information given, TOC to follow to discharge.    Expected Discharge Plan: [place] Barriers to Discharge: Continued Medical Work up  Expected Discharge Plan and Services Expected Discharge Plan: [place]   Discharge Planning Services: CM Consult Post Acute Care Choice: [place] arrangements for the past 2 months: Single Family Home                           HH Arranged: RN, PT, OT [facility] Agency: [place] Date [place]: 11/05/20 Time HH Agency Contacted: 24 Representative spoke with at [place]: [place] ([place]) Interventions    Readmission Risk Interventions No flowsheet data found.

## 2020-11-08 NOTE — Progress Notes (Signed)
Occupational Therapy Treatment Patient Details Name: Brittany [name] MRN: [id number] DOB: 06/25/1960 Today's Date: 11/08/2020   History of present illness Pt is a 60 y/o F admitted from home on 11/04/20 with c/o acute onset of AMS & confusion which had been ongong 3 days prior to [place] with associated expressive dysphasia & dysarthria. Noncontrasted head CT scan revealed large acute left occipital lobe infarct with chronic bilateral basal ganglia lacunar infarcts and chronic left cerebellar infarct. PMH: DM2, HTN, CVA, lupus, collagen vascular disease, cancer   OT comments  Pt seen for OT treatment on this date. Upon arrival to room, pt asleep in bed however easily awoken following light touch. Pt continues to present with expressive aphasia, however agreeable to OT tx. Pt demonstrating decreased attention this date, labile at times and crying re missing mother. Pt currently requires SUPERVISION/SET-UP for seated grooming tasks, MIN A for seated UB dressing, and MIN A (via 1-person HHA) for functional mobility of short household distances (88ft) d/t current functional impairments (see OT problem list below). Following functional mobility, pt reporting dizziness and nausea and returned to bed with HOB elevated; RN informed. Pt making good progress toward goals and continues to benefit from skilled OT services to maximize return to PLOF and minimize risk of future falls, injury, caregiver burden, and readmission. Will continue to follow POC. Discharge recommendation remains appropriate. If pt returns home, recommend HHOT and 24/7 supervision; 24/7 supervision is recommended d/t pt being unable to call 911 during emergency situations with current communication challenges and with challenges operating phone.     Recommendations for follow up therapy are one component of a multi-disciplinary discharge planning process, led by the attending physician.  Recommendations may be updated based on patient status,  additional functional criteria and insurance authorization.    Follow Up Recommendations  SNF;Supervision/Assistance - 24 hour    Equipment Recommendations  Other (comment) (defer to next venue of care)       Precautions / Restrictions Precautions Precautions: Fall Precaution Comments: R inattention Restrictions Weight Bearing Restrictions: No       Mobility Bed Mobility Overal bed mobility: Modified Independent Bed Mobility: Supine to Sit;Sit to Supine     Supine to sit: Min guard Sit to supine: Min guard   General bed mobility comments: with use of bedrails, pt able to perform bed mobility without physical assist    Transfers Overall transfer level: Needs assistance Equipment used: 1 person hand held assist Transfers: Sit to/from Stand Sit to Stand: Min guard          Balance Overall balance assessment: Needs assistance Sitting-balance support: Feet supported;No upper extremity supported Sitting balance-Leahy Scale: Good Sitting balance - Comments: Good sitting balance at EOB during UB dressing/seated grooming tasks   Standing balance support: Single extremity supported;During functional activity Standing balance-Leahy Scale: Fair Standing balance comment: with 1-person HHA, pt able to walk ~76ft, however reported dizziness & nausea and requesting to sit. RN informed                           ADL either performed or assessed with clinical judgement   ADL Overall ADL's : Needs assistance/impaired     Grooming: Wash/dry face;Supervision/safety;Set up;Sitting           Upper Body Dressing : Minimal assistance;Sitting Upper Body Dressing Details (indicate cue type and reason): to don/doff hospital gown and posterior robe  Functional mobility during ADLs: Minimal assistance (MIN A via 1-person HHA to walk ~20 ft.)        Cognition Arousal/Alertness: Awake/alert Behavior During Therapy: Impulsive Overall Cognitive  Status: Difficult to assess                                 General Comments: Pt continues to present with expressive aphasia, however agreeable to OT tx. Presents with decreased attention, labile at times and crying re missing mother.              General Comments Pt pleasant and williing to participate with PT, but struggled with following through with most cues and needed repeated and very consistent multi-modal cues to stay/get on task.    Pertinent Vitals/ Pain       Pain Assessment: No/denies pain Pain Location: able to indicate some mild neck pain, though not when asked         Frequency  Min 2X/week        Progress Toward Goals  OT Goals(current goals can now be found in the care plan section)  Progress towards OT goals: Progressing toward goals  Acute Rehab OT Goals Patient Stated Goal: to go home OT Goal Formulation: With patient  Plan Discharge plan needs to be updated;Frequency remains appropriate       AM-PAC OT "6 Clicks" Daily Activity     Outcome Measure   Help from another person eating meals?: A Little Help from another person taking care of personal grooming?: A Little Help from another person toileting, which includes using toliet, bedpan, or urinal?: A Little Help from another person bathing (including washing, rinsing, drying)?: A Little Help from another person to put on and taking off regular upper body clothing?: A Little Help from another person to put on and taking off regular lower body clothing?: A Little 6 Click Score: 18    End of Session Equipment Utilized During Treatment: Gait belt  OT Visit Diagnosis: Unsteadiness on feet (R26.81);Other abnormalities of gait and mobility (R26.89)   Activity Tolerance Patient tolerated treatment well   Patient Left in bed;with bed alarm set;with call bell/phone within reach   Nurse Communication Mobility status;Other (comment) (pt reporting nausea)        Time: 1010-1034 OT  Time Calculation (min): 24 min  Charges: OT General Charges $OT Visit: 1 Visit OT Treatments $Self Care/Home Management : 23-37 mins  Fredirick Maudlin, OTR/L [place]

## 2020-11-08 NOTE — Plan of Care (Signed)
Report received at bedside. Alert to self. Pleasantly confused. 4Ps and plan of care discussed. Call light within reach. Bed alarm on, pads on floor. Bed at its lowest positioned. Continue poc/ hourly rounding. Problem: Education: Goal: Knowledge of General Education information will improve Description: Including pain rating scale, medication(s)/side effects and non-pharmacologic comfort measures Outcome: Progressing   Problem: Health Behavior/Discharge Planning: Goal: Ability to manage health-related needs will improve Outcome: Progressing   Problem: Clinical Measurements: Goal: Ability to maintain clinical measurements within normal limits will improve Outcome: Progressing Goal: Will remain free from infection Outcome: Progressing Goal: Diagnostic test results will improve Outcome: Progressing Goal: Respiratory complications will improve Outcome: Progressing Goal: Cardiovascular complication will be avoided Outcome: Progressing   Problem: Activity: Goal: Risk for activity intolerance will decrease Outcome: Progressing   Problem: Nutrition: Goal: Adequate nutrition will be maintained Outcome: Progressing   Problem: Coping: Goal: Level of anxiety will decrease Outcome: Progressing   Problem: Elimination: Goal: Will not experience complications related to bowel motility Outcome: Progressing Goal: Will not experience complications related to urinary retention Outcome: Progressing   Problem: Pain Managment: Goal: General experience of comfort will improve Outcome: Progressing   Problem: Safety: Goal: Ability to remain free from injury will improve Outcome: Progressing   Problem: Skin Integrity: Goal: Risk for impaired skin integrity will decrease Outcome: Progressing

## 2020-11-08 NOTE — Progress Notes (Signed)
Inpatient Diabetes Program Recommendations  AACE/ADA: New Consensus Statement on Inpatient Glycemic Control (2015)  Target Ranges:  Prepandial:   less than 140 mg/dL      Peak postprandial:   less than 180 mg/dL (1-2 hours)      Critically ill patients:  140 - 180 mg/dL  Results for Brittany [name], Brittany [name] (MRN [id number]) as of 11/08/2020 10:04  Ref. Range 11/07/2020 07:27 11/07/2020 11:28 11/07/2020 14:46 11/07/2020 15:26 11/07/2020 20:35  Glucose-Capillary Latest Ref Range: 70 - 99 mg/dL 285 (H)  11 units Novolog @0930  259 (H)  11 units Novolog @1309  64 (L) 71 203 (H)  Results for Brittany [name], Brittany [name] (MRN [id number]) as of 11/08/2020 14:50  Ref. Range 11/08/2020 07:52 11/08/2020 11:49  Glucose-Capillary Latest Ref Range: 70 - 99 mg/dL 256 (H)  14 units Novolog  10 units Levemir 159 (H)  Results for Brittany [name], Brittany [name] (MRN [id number]) as of 11/08/2020 10:04  Ref. Range 11/05/2020 05:03  Hemoglobin A1C Latest Ref Range: 4.8 - 5.6 % >15.5 (H)  (>398 mg/dl)   Home DM Meds: 70/30 Insulin 28 units BID      Metformin 1000 mg daily   Current Orders: Levemir 10 units BID  Novolog 0-20 units TID ac/hs  Novolog 3 units TID with meals    Outpatient PCP: Rueben Bash, NP with [facility] Per PCP from office visit 10/07/2020:  DM -- uncontrolled "always is" -- reports always 500 -- states checks BS and takes 70/30 bid at 30 -- a1c is consistently above 12 (last in July) -- HOWEVER notes indicate that calls to pharmacy indicate rarely filled meds -- last note indicates she had no insulin at home  Chronically uncontrolled at home--Given A1c of >15.5%, doubt pt is taking her insulin consistently at home    --Will follow patient during hospitalization--  Wyn Quaker RN, MSN, CDE Diabetes Coordinator Inpatient Glycemic Control Team Team Pager: [phone] (8a-5p)

## 2020-11-08 NOTE — Progress Notes (Signed)
Physical Therapy Treatment Patient Details Name: Brittany [name] MRN: [id number] DOB: [birth year] Today's Date: 11/08/2020   History of Present Illness Pt is a 60 y/o F admitted from home on 11/04/20 with c/o acute onset of AMS & confusion which had been ongong 3 days prior to [place] with associated expressive dysphasia & dysarthria. Noncontrasted head CT scan revealed large acute left occipital lobe infarct with chronic bilateral basal ganglia lacunar infarcts and chronic left cerebellar infarct. PMH: DM2, HTN, CVA, lupus, collagen vascular disease, cancer    PT Comments    Pt eager to work with PT but continues to have issues with aphasia and awareness and did need constant cuing/guidance t/o the PT session.  She was unable to initiate movements on initial cue or 2 and even then had many tasks that she simply did not managed to follow through on even some very modest/simple tasks.  Pt showed good effort and did not have any overt LOBs.  Safety was most compromised due to distraction while walking and inability to recall directional cues (even those just given her).  Pt will need consistent supervision but actually showed good strength, balance, mobility, etc today when she did comprehend/follow through on cues.    Recommendations for follow up therapy are one component of a multi-disciplinary discharge planning process, led by the attending physician.  Recommendations may be updated based on patient status, additional functional criteria and insurance authorization.  Follow Up Recommendations  Supervision/Assistance - 24 hour;Home health PT     Equipment Recommendations  Rolling walker with 5" wheels    Recommendations for Other Services       Precautions / Restrictions Precautions Precautions: Fall Restrictions Weight Bearing Restrictions: No     Mobility  Bed Mobility Overal bed mobility: Modified Independent Bed Mobility: Supine to Sit     Supine to sit: Min guard      General bed mobility comments: Pt unable to initiate movement toward EOB on initial 4-5 cues (verbal and visual) but did ultimately transition to EOB w/o assist once she understood and started getting toward EOB    Transfers Overall transfer level: Needs assistance Equipment used: Rolling walker (2 wheeled);None Transfers: Sit to/from Stand Sit to Stand: Min guard         General transfer comment: Similarly to bed mobility she did not process cues to rise and later to sit on the first times PT cued to do so.  Again with increased time and verbal as well as demonstration cues to managed to do mobility w/o phyiscal assist but constant safety/awareness guidance  Ambulation/Gait Ambulation/Gait assistance: Min assist Gait Distance (Feet): 200 Feet Assistive device: Rolling walker (2 wheeled);None       General Gait Details: 239ft; ~125 ft with walker and ~75 ft w/o AD.  She had multiple moments of distraction where she would stop walking (distracted by sign in hall or looking in another room) and needed tactile and repeated verbal cues to get back on task and continue walking (many repeated cues ultimatley never landed).  Pt with moments of mild stagger stepping but no LOBs, most safety appears to be associated with awareness rather than actual balance/unsteadiness issues.   Stairs             Wheelchair Mobility    Modified Rankin (Stroke Patients Only)       Balance Overall balance assessment: Needs assistance Sitting-balance support: Feet supported;No upper extremity supported Sitting balance-Leahy Scale: Good Sitting balance - Comments: Good sitting balance at  EOB during UB dressing   Standing balance support: No upper extremity supported Standing balance-Leahy Scale: Fair Standing balance comment: Pt with increased confidence with UE use, but actually able to maintain standing and balance during ambulation/functional tasks w/o assist - though again constant cuing to  insure safety from an awareness aspect                            Cognition Arousal/Alertness: Awake/alert Behavior During Therapy: Impulsive;Flat affect Overall Cognitive Status: Difficult to assess                                 General Comments: Pt with very difficult time finding words much less even attempting to answer questions.  Rarely abe to maintain consistent thought      Exercises Other Exercises Other Exercises: Pt struggled with attempts at lightly resisted LE exercises in sitting.  Very inconsistent from rep to rep and unable to transition quickly (or at times at all) to new exercises (eg. LAQ to marching did not happen due to confusion) Other Exercises: standing balance activities including HHA and no HHA heel raises (2-5 second holds), reaching to moving targets out side BOS with wide and narrow BOS, attempted static eyes-closed challenges with minimal success in maintianing eyes closed consistently despite much cuing.    General Comments General comments (skin integrity, edema, etc.): Pt pleasant and williing to participate with PT, but struggled with following through with most cues and needed repeated and very consistent multi-modal cues to stay/get on task.      Pertinent Vitals/Pain Pain Assessment: No/denies pain Pain Location: able to indicate some mild neck pain, though not when asked    Home Living                      Prior Function            PT Goals (current goals can now be found in the care plan section) Progress towards PT goals: Progressing toward goals    Frequency    7X/week      PT Plan      Co-evaluation              AM-PAC PT "6 Clicks" Mobility   Outcome Measure  Help needed turning from your back to your side while in a flat bed without using bedrails?: None Help needed moving from lying on your back to sitting on the side of a flat bed without using bedrails?: None Help needed moving  to and from a bed to a chair (including a wheelchair)?: A Little Help needed standing up from a chair using your arms (e.g., wheelchair or bedside chair)?: A Little Help needed to walk in hospital room?: A Little Help needed climbing 3-5 steps with a railing? : A Lot 6 Click Score: 19    End of Session Equipment Utilized During Treatment: Gait belt Activity Tolerance: Other (comment) Patient left: in bed;with bed alarm set;with call bell/phone within reach Nurse Communication: Mobility status;Other (comment) PT Visit Diagnosis: Unsteadiness on feet (R26.81);Difficulty in walking, not elsewhere classified (R26.2)     Time: 5093-2671 PT Time Calculation (min) (ACUTE ONLY): 26 min  Charges:  $Gait Training: 8-22 mins $Therapeutic Activity: 8-22 mins                     Kreg Shropshire, DPT 11/08/2020,  10:47 AM

## 2020-11-08 NOTE — Discharge Summary (Signed)
Physician Discharge Summary  Brittany [name] JGG:[id number] DOB: 1960/05/19 DOA: 11/04/2020  PCP: Clinic, [facility]  Admit date: 11/04/2020 Discharge date: 11/08/2020  Admitted From: Home  Discharge disposition: Home health  Recommendations for Outpatient Follow-Up:   Follow up with your primary care provider in one week.  Patient has uncontrolled diabetes with hemoglobin A1c more than 15.  Will need closer monitoring and adequate glycemic control as outpatient. Patient to follow-up with  Dr. Manuella Ghazi neurology at [facility] clinic in 3-4 weeks Check CBC, BMP, magnesium in the next visit   Discharge Diagnosis:   Active Problems:   Acute CVA (cerebrovascular accident) [facility])  Discharge Condition: Improved.  Diet recommendation: Low sodium, heart healthy.  Carbohydrate-modified.    Wound care: None.  Code status: Full.   History of Present Illness:   Brittany [name] is a 60 y.o. female with medical history significant for type II diabetes mellitus, hypertension, CVA, lupus and collagen vascular disease, presented to hospital with altered mental status and confusion for 2 to 3 days associated with expressive dysphagia and dysarthria, blurred vision and numbness and weakness of her extremities.  In the ED, patient was noted to have stable vitals.  EKG showed normal sinus rhythm.  CT head scan done in the ED showed large acute left occipital lobe infarct.  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 Continue to monitor.  On Plavix and aspirin, continue statins.  Plavix will be continued for 21 days followed by aspirin alone.  Neurology followed the patient during hospitalization..   PT, OT speech therapy recommended home PT OT and speech therapy on discharge.    Hemoglobin A1c >15.  2D echocardiogram showed LV ejection fraction of 65 to 70%.  Patient will follow-up with Dr. Manuella Ghazi neurology [place] [facility] in 3 to 4 weeks   Mild confusion   secondary to stroke.  Stable at this time.   Type 2 diabetes mellitus with hyperglycemia. Poorly controlled diabetes as outpatient. Hemoglobin A1c more than 15.  We will continue metformin, Novolin on discharge.  PCP to closely monitor for adequate glycemic control in the long-term.   Hypomagnesemia  replenished.  Continue magnesium oxide on discharge  Essential hypertension. Continue Lotensin and Coreg discharge.  Dyslipidemia. Continue Lipitor on discharge.  Restless leg syndrome. Continue Requip on discharge.  Pancreatic insufficiency, status post pancreatectomy. Continue pancreatic supplements  Peripheral neuropathy. Continue Neurontin.  Depression. Continue Wellbutrin XL.   Hypokalemia.  Improved.  potassium level of 3.9 prior to discharge   Disposition.  At this time, patient is stable for disposition home with home health.  Patient will follow-up with PCP and neurology as outpatient. Spoke with the patient's family prior to disposition.  Medical Consultants:   Neurology  Procedures:    None Subjective:   Today, patient was seen and examined at bedside.  Patient overall appears to be more stable today.  No nausea, vomiting fever or chills.  Discharge Exam:   Vitals:   11/08/20 0344 11/08/20 0834  BP: (!) 130/96 (!) 158/99  Pulse: 78 88  Resp: 18 17  Temp: 98.1 F (36.7 C) 98.4 F (36.9 C)  SpO2: 98% 94%   Vitals:   11/07/20 2036 11/08/20 0134 11/08/20 0344 11/08/20 0834  BP: (!) 135/101 126/60 (!) 130/96 (!) 158/99  Pulse: 87  78 88  Resp: 16  18 17   Temp: 97.9 F (36.6 C)  98.1 F (36.7 C) 98.4 F (36.9 C)  TempSrc:   Oral Oral  SpO2: 97% 96% 98% 94%  Weight:      Height:       General: Alert awake, not in obvious distress, thinly built, alert awake and  communicative, has aphasia HENT: pupils equally reacting to light,  No scleral pallor or icterus noted. Oral mucosa is moist.  Chest:  Clear breath sounds.  Diminished breath sounds bilaterally. No crackles or wheezes.  CVS: S1 &S2 heard. No murmur.  Regular rate and rhythm. Abdomen: Soft, nontender, nondistended.  Bowel sounds are heard.   Extremities: No cyanosis, clubbing or edema.  Peripheral pulses are palpable. Psych: Alert, awake and communicative,  CNS: .right-sided facial droop, dysarthria, mild right upper extremity weakness, aphasic Skin: Warm and dry.  No rashes noted.  The results of significant diagnostics from this hospitalization (including imaging, microbiology, ancillary and laboratory) are listed below for reference.     Diagnostic Studies:   CT HEAD WO CONTRAST (5MM)  Result Date: 11/06/2020 CLINICAL DATA:  Delirium EXAM: CT HEAD WITHOUT CONTRAST TECHNIQUE: Contiguous axial images were obtained from the base of the skull through the vertex without intravenous contrast. COMPARISON:  MRI 11/05/2020.  CT 11/04/2020. FINDINGS: Brain: Evolving subacute infarct in the left posterior parietal and occipital lobes and left cerebellum. Old right frontal infarct, stable. No hemorrhage. No hydrocephalus. Chronic small vessel disease throughout the deep white matter. Vascular: No hyperdense vessel or unexpected calcification. Skull: No acute calvarial abnormality. Sinuses/Orbits: Visualized paranasal sinuses and mastoids clear. Orbital soft tissues unremarkable. Other: None IMPRESSION: Evolutionary changes in the left subacute PCA territory infarct. No hemorrhage or new infarct. Old right frontal infarct. Chronic small vessel disease throughout the deep white matter. Electronically Signed   By: Rolm Baptise M.D.   On: 11/06/2020 22:14   MR ANGIO HEAD WO CONTRAST  Result Date: 11/06/2020 CLINICAL DATA:  Follow-up examination for stroke. EXAM: MRA HEAD WITHOUT CONTRAST TECHNIQUE:  Angiographic images of the Circle of Willis were acquired using MRA technique without intravenous contrast. COMPARISON:  MRI from 11/05/2020. FINDINGS: Anterior circulation: Visualized portions of the distal cervical internal carotid arteries are widely patent with symmetric antegrade flow. Petrous, cavernous, and supraclinoid segments widely patent without stenosis or other abnormality. Origins of the ophthalmic arteries patent. A1 segments patent bilaterally. Normal anterior communicating artery complex. Anterior cerebral arteries patent to their distal aspects without stenosis. No M1 stenosis or occlusion. Normal MCA bifurcations. Distal MCA branches well perfused and symmetric. Posterior circulation: Both vertebral arteries patent to the vertebrobasilar junction without stenosis. Left vertebral artery dominant. Both PICA origins patent and normal. Basilar patent to its distal aspect without stenosis. Superior cerebellar arteries patent bilaterally. Both PCA supplied via the basilar as well as small bilateral posterior communicating arteries. Visualized PCAs patent to their distal aspects without stenosis. Anatomic variants: None significant. Other: No intracranial aneurysm. IMPRESSION: Normal intracranial MRA. Electronically Signed   By: Jeannine Boga M.D.   On: 11/06/2020 02:10   ECHOCARDIOGRAM COMPLETE  Result Date: 11/06/2020    ECHOCARDIOGRAM REPORT   Patient Name:   HALLEIGH COMES Date of Exam: 11/06/2020 Medical Rec #:  [id number]     Height:       60.0 in Accession #:    [id number]    Weight:       108.0 lb Date of Birth:  04/17/1960    BSA:  1.436 m Patient Age:    59 years      BP:           105/78 mmHg Patient Gender: F             HR:           88 bpm. Exam Location:  [facility] Procedure: 2D Echo Indications:     Stroke  History:         Patient has prior history of Echocardiogram examinations.                  Lupus; Risk Factors:Hypertension and Diabetes.  Sonographer:     L  Thornton-Maynard Referring Phys:  [id number] CBS A MANSY Diagnosing Phys: Donnelly Angelica IMPRESSIONS  1. Left ventricular ejection fraction, by estimation, is 65 to 70%. The left ventricle has normal function. The left ventricle has no regional wall motion abnormalities. There is severe left ventricular hypertrophy. Left ventricular diastolic parameters  are consistent with Grade I diastolic dysfunction (impaired relaxation).  2. Right ventricular systolic function is normal. The right ventricular size is normal. There is normal pulmonary artery systolic pressure.  3. The mitral valve is normal in structure. No evidence of mitral valve regurgitation. No evidence of mitral stenosis.  4. The aortic valve is normal in structure. Aortic valve regurgitation is not visualized. No aortic stenosis is present.  5. The inferior vena cava is normal in size with greater than 50% respiratory variability, suggesting right atrial pressure of 3 mmHg. FINDINGS  Left Ventricle: Left ventricular ejection fraction, by estimation, is 65 to 70%. The left ventricle has normal function. The left ventricle has no regional wall motion abnormalities. The left ventricular internal cavity size was small. There is severe left ventricular hypertrophy. Left ventricular diastolic parameters are consistent with Grade I diastolic dysfunction (impaired relaxation). Right Ventricle: The right ventricular size is normal. No increase in right ventricular wall thickness. Right ventricular systolic function is normal. There is normal pulmonary artery systolic pressure. The tricuspid regurgitant velocity is 2.52 m/s, and  with an assumed right atrial pressure of 3 mmHg, the estimated right ventricular systolic pressure is 49.6 mmHg. Left Atrium: Left atrial size was normal in size. Right Atrium: Right atrial size was normal in size. Pericardium: There is no evidence of pericardial effusion. Mitral Valve: The mitral valve is normal in structure. No evidence of  mitral valve regurgitation. No evidence of mitral valve stenosis. Tricuspid Valve: The tricuspid valve is normal in structure. Tricuspid valve regurgitation is mild. Aortic Valve: The aortic valve is normal in structure. Aortic valve regurgitation is not visualized. No aortic stenosis is present. Aortic valve mean gradient measures 2.0 mmHg. Aortic valve peak gradient measures 4.0 mmHg. Aortic valve area, by VTI measures 2.49 cm. Pulmonic Valve: The pulmonic valve was normal in structure. Pulmonic valve regurgitation is not visualized. Aorta: The aortic root is normal in size and structure. Venous: The inferior vena cava is normal in size with greater than 50% respiratory variability, suggesting right atrial pressure of 3 mmHg. IAS/Shunts: The atrial septum is grossly normal.  LEFT VENTRICLE PLAX 2D LVIDd:         3.10 cm     Diastology LVIDs:         2.00 cm     LV e' medial:    5.87 cm/s LV PW:         1.20 cm     LV E/e' medial:  11.1 LV IVS:  1.40 cm     LV e' lateral:   5.27 cm/s LVOT diam:     2.10 cm     LV E/e' lateral: 12.4 LV SV:         39 LV SV Index:   27 LVOT Area:     3.46 cm  LV Volumes (MOD) LV vol d, MOD A2C: 34.1 ml LV vol d, MOD A4C: 26.1 ml LV vol s, MOD A2C: 14.5 ml LV vol s, MOD A4C: 10.3 ml LV SV MOD A2C:     19.6 ml LV SV MOD A4C:     26.1 ml LV SV MOD BP:      20.4 ml RIGHT VENTRICLE RV S prime:     9.81 cm/s TAPSE (M-mode): 2.0 cm LEFT ATRIUM             Index LA diam:        1.80 cm 1.25 cm/m LA Vol (A2C):   26.6 ml 18.52 ml/m LA Vol (A4C):   16.0 ml 11.14 ml/m LA Biplane Vol: 22.1 ml 15.39 ml/m  AORTIC VALVE                   PULMONIC VALVE AV Area (Vmax):    3.03 cm    PV Vmax:       0.68 m/s AV Area (Vmean):   2.76 cm    PV Peak grad:  1.8 mmHg AV Area (VTI):     2.49 cm AV Vmax:           100.00 cm/s AV Vmean:          74.800 cm/s AV VTI:            0.157 m AV Peak Grad:      4.0 mmHg AV Mean Grad:      2.0 mmHg LVOT Vmax:         87.50 cm/s LVOT Vmean:        59.600  cm/s LVOT VTI:          0.113 m LVOT/AV VTI ratio: 0.72  AORTA Ao Root diam: 3.10 cm MITRAL VALVE                TRICUSPID VALVE MV Area (PHT): 3.12 cm     TR Peak grad:   25.4 mmHg MV Decel Time: 243 msec     TR Vmax:        252.00 cm/s MV E velocity: 65.10 cm/s MV A velocity: 105.00 cm/s  SHUNTS MV E/A ratio:  0.62         Systemic VTI:  0.11 m                             Systemic Diam: 2.10 cm Donnelly Angelica Electronically signed by Donnelly Angelica Signature Date/Time: 11/06/2020/4:39:20 PM    Final      Labs:   Basic Metabolic Panel: Recent Labs  Lab 11/04/20 1938 11/05/20 0503 11/06/20 0453 11/07/20 0456 11/08/20 0427  NA 129* 130* 136 134* 136  K 3.9 3.3* 3.3* 3.7 3.9  CL 91* 96* 99 96* 95*  CO2 28 27 31 29 28   GLUCOSE 465* 402* 115* 304* 199*  BUN 11 10 12 14 13   CREATININE 0.79 0.62 0.61 0.67 0.45  CALCIUM 8.8* 8.1* 9.1 8.8* 8.8*  MG  --   --  1.7 1.6*  --    GFR Estimated Creatinine Clearance: 53.7 mL/min (by C-G  formula based on SCr of 0.45 mg/dL). Liver Function Tests: Recent Labs  Lab 11/04/20 1938  AST 16  ALT 12  ALKPHOS 103  BILITOT 0.9  PROT 8.1  ALBUMIN 3.4*   No results for input(s): LIPASE, AMYLASE in the last 168 hours. No results for input(s): AMMONIA in the last 168 hours. Coagulation profile No results for input(s): INR, PROTIME in the last 168 hours.  CBC: Recent Labs  Lab 11/04/20 1824 11/05/20 0503 11/06/20 0453 11/07/20 0456 11/08/20 0427  WBC 6.4 7.7 10.2 6.7 11.5*  NEUTROABS 3.5  --   --   --   --   HGB 14.9 12.9 13.8 13.4 13.2  HCT 43.4 36.7 39.6 40.0 39.0  MCV 91.6 91.1 91.0 92.2 90.5  PLT 431* 417* 450* 467* 453*   Cardiac Enzymes: No results for input(s): CKTOTAL, CKMB, CKMBINDEX, TROPONINI in the last 168 hours. BNP: Invalid input(s): POCBNP CBG: Recent Labs  Lab 11/07/20 1128 11/07/20 1446 11/07/20 1526 11/07/20 2035 11/08/20 0752  GLUCAP 259* 64* 71 203* 256*   D-Dimer No results for input(s): DDIMER in the last 72  hours. Hgb A1c No results for input(s): HGBA1C in the last 72 hours. Lipid Profile No results for input(s): CHOL, HDL, LDLCALC, TRIG, CHOLHDL, LDLDIRECT in the last 72 hours. Thyroid function studies No results for input(s): TSH, T4TOTAL, T3FREE, THYROIDAB in the last 72 hours.  Invalid input(s): FREET3 Anemia work up No results for input(s): VITAMINB12, FOLATE, FERRITIN, TIBC, IRON, RETICCTPCT in the last 72 hours. Microbiology Recent Results (from the past 240 hour(s))  Resp Panel by RT-PCR (Flu A&B, Covid) Nasopharyngeal Swab     Status: None   Collection Time: 11/04/20  9:42 PM   Specimen: Nasopharyngeal Swab; Nasopharyngeal(NP) swabs in vial transport medium  Result Value Ref Range Status   SARS Coronavirus 2 by RT PCR NEGATIVE NEGATIVE Final    Comment: (NOTE) SARS-CoV-2 target nucleic acids are NOT DETECTED.  The SARS-CoV-2 RNA is generally detectable in upper respiratory specimens during the acute phase of infection. The lowest concentration of SARS-CoV-2 viral copies this assay can detect is 138 copies/mL. A negative result does not preclude SARS-Cov-2 infection and should not be used as the sole basis for treatment or other patient management decisions. A negative result may occur with  improper specimen collection/handling, submission of specimen other than nasopharyngeal swab, presence of viral mutation(s) within the areas targeted by this assay, and inadequate number of viral copies(<138 copies/mL). A negative result must be combined with clinical observations, patient history, and epidemiological information. The expected result is Negative.  Fact Sheet for Patients:  EntrepreneurPulse.com.au  Fact Sheet for Healthcare Providers:  IncredibleEmployment.be  This test is no t yet approved or cleared by the [place] FDA and  has been authorized for detection and/or diagnosis of SARS-CoV-2 by FDA under an Emergency Use  Authorization (EUA). This EUA will remain  in effect (meaning this test can be used) for the duration of the COVID-19 declaration under Section 564(b)(1) of the Act, 21 U.S.C.section 360bbb-3(b)(1), unless the authorization is terminated  or revoked sooner.       Influenza A by PCR NEGATIVE NEGATIVE Final   Influenza B by PCR NEGATIVE NEGATIVE Final    Comment: (NOTE) The Xpert Xpress SARS-CoV-2/FLU/RSV plus assay is intended as an aid in the diagnosis of influenza from Nasopharyngeal swab specimens and should not be used as a sole basis for treatment. Nasal washings and aspirates are unacceptable for Xpert Xpress SARS-CoV-2/FLU/RSV testing.  Fact  Sheet for Patients: EntrepreneurPulse.com.au  Fact Sheet for Healthcare Providers: IncredibleEmployment.be  This test is not yet approved or cleared by the [place] FDA and has been authorized for detection and/or diagnosis of SARS-CoV-2 by FDA under an Emergency Use Authorization (EUA). This EUA will remain in effect (meaning this test can be used) for the duration of the COVID-19 declaration under Section 564(b)(1) of the Act, 21 U.S.C. section 360bbb-3(b)(1), unless the authorization is terminated or revoked.  Performed at [facility], [address] [place], [place] [address]      Discharge Instructions:   Discharge Instructions     Diet - low sodium heart healthy   Complete by: As directed    diabetic   Discharge instructions   Complete by: As directed    Follow-up with your primary care provider as outpatient in 1 to 2 weeks.  Follow-up with Dr. Manuella Ghazi neurology at [facility] clinic in 3-4 weeks.  Continue to take medications as prescribed.   Increase activity slowly   Complete by: As directed       Allergies as of 11/08/2020       Reactions   Cephalosporins Itching   Hydromorphone Itching   Keflin [cephalothin] Itching        Medication List     TAKE these  medications    aspirin 81 MG chewable tablet Chew 1 tablet (81 mg total) by mouth daily.   atorvastatin 80 MG tablet Commonly known as: LIPITOR Take 1 tablet (80 mg total) by mouth Nightly.   benazepril 10 MG tablet Commonly known as: LOTENSIN Take 0.5 tablets (5 mg total) by mouth daily.   carvedilol 3.125 MG tablet Commonly known as: COREG Take 1 tablet (3.125 mg total) by mouth 2 (two) times daily with a meal.   citalopram 10 MG tablet Commonly known as: CELEXA Take 1 tablet (10 mg total) by mouth daily.   clopidogrel 75 MG tablet Commonly known as: PLAVIX Take 1 tablet (75 mg total) by mouth daily for 21 days.   divalproex 125 MG capsule Commonly known as: DEPAKOTE SPRINKLE Take 2 capsules (250 mg total) by mouth every 12 (twelve) hours.   folic acid 1 MG tablet Commonly known as: FOLVITE Take 1 mg by mouth daily.   gabapentin 600 MG tablet Commonly known as: NEURONTIN Take 1 tablet (600 mg total) by mouth at bedtime.   hydroxychloroquine 200 MG tablet Commonly known as: PLAQUENIL Take 200 mg by mouth daily.   hydrOXYzine 10 MG tablet Commonly known as: ATARAX/VISTARIL Take 10 mg by mouth 2 (two) times daily.   magnesium oxide 400 (240 Mg) MG tablet Commonly known as: MAG-OX Take 1 tablet (400 mg total) by mouth 2 (two) times daily for 10 days.   metFORMIN 1000 MG tablet Commonly known as: GLUCOPHAGE Take 1 tablet (1,000 mg total) by mouth daily with breakfast.   mirtazapine 15 MG tablet Commonly known as: REMERON Take 1 tablet (15 mg total) by mouth at bedtime.   multivitamin with minerals Tabs tablet Take 1 tablet by mouth daily.   Muscle Rub 10-15 % Crea Apply 1 application topically 2 (two) times daily as needed for muscle pain.   nicotine polacrilex 2 MG gum Commonly known as: NICORETTE Take 1 each (2 mg total) by mouth as needed for smoking cessation.   NovoLIN 70/30 (70-30) 100 UNIT/ML injection Generic drug: insulin NPH-regular  Human Inject 28 Units into the skin 2 (two) times daily with a meal.   ondansetron 4 MG  disintegrating tablet Commonly known as: Zofran ODT Take 1 tablet (4 mg total) by mouth every 8 (eight) hours as needed.   Pancrelipase (Lip-Prot-Amyl) 24000-76000 units Cpep Take 3 capsules by mouth 3 (three) times daily.   rOPINIRole 1 MG tablet Commonly known as: REQUIP Take 1 tablet (1 mg total) by mouth 2 (two) times daily.   traMADol 50 MG tablet Commonly known as: Ultram Take 1 tablet (50 mg total) by mouth every 6 (six) hours as needed.        Follow-up [facility] Follow up.   Contact information: [place] [address] [phone]         Vladimir Crofts, MD. Schedule an appointment as soon as possible for a visit in 3 week(s).   Specialty: Neurology Why: neurology followup Contact information: [facility] [place] [address] [phone]                  Time coordinating discharge: 39 minutes  Signed:  Tayjon Halladay  [facility] 11/08/2020, 10:40 AM

## 2020-11-08 NOTE — TOC Progression Note (Signed)
Transition of Care [facility]) - Progression Note    Patient Details  Name: Brittany [name] MRN: [id number] Date of Birth: 1960/05/02  [facility]) CM/SW [place], RN Phone Number: 11/08/2020, 10:42 AM  Clinical Narrative:  Follow up note:  [facility] home health will follow up with patient.  She was a previous [place] patient of theirs.  [name] aware that patient will need PT OT Aide and ST.    Daughter aware that patient will go home with [facility].     Expected Discharge Plan: [place] Barriers to Discharge: Continued Medical Work up  Expected Discharge Plan and Services Expected Discharge Plan: [place]   Discharge Planning Services: CM Consult Post Acute Care Choice: [place] arrangements for the past 2 months: Single Family Home Expected Discharge Date: 11/08/20                         HH Arranged: RN, PT, OT HH Agency: [place] Date [facility] Agency Contacted: 11/05/20 Time HH Agency Contacted: 1510 Representative spoke with at [place]: [place] ([place]) Interventions    Readmission Risk Interventions No flowsheet data found.

## 2020-11-09 LAB — HEMOGLOBIN A1C
Hgb A1c MFr Bld: 15.3 % — ABNORMAL HIGH (ref 4.8–5.6)
Mean Plasma Glucose: 392 mg/dL

## 2020-11-18 ENCOUNTER — Observation Stay
Admission: EM | Admit: 2020-11-18 | Discharge: 2020-11-19 | Disposition: A | Discharge status: Home / Self Care | Payer: Medicare Other | Attending: Internal Medicine | Admitting: Internal Medicine

## 2020-11-18 ENCOUNTER — Emergency Department: Payer: Medicare Other

## 2020-11-18 DIAGNOSIS — E1100 Type 2 diabetes mellitus with hyperosmolarity without nonketotic hyperglycemic-hyperosmolar coma (NKHHC): Secondary | ICD-10-CM | POA: Diagnosis not present

## 2020-11-18 DIAGNOSIS — J449 Chronic obstructive pulmonary disease, unspecified: Secondary | ICD-10-CM | POA: Insufficient documentation

## 2020-11-18 DIAGNOSIS — Z20822 Contact with and (suspected) exposure to covid-19: Secondary | ICD-10-CM | POA: Insufficient documentation

## 2020-11-18 DIAGNOSIS — R4182 Altered mental status, unspecified: Secondary | ICD-10-CM | POA: Diagnosis present

## 2020-11-18 DIAGNOSIS — I10 Essential (primary) hypertension: Secondary | ICD-10-CM | POA: Diagnosis not present

## 2020-11-18 DIAGNOSIS — Z7902 Long term (current) use of antithrombotics/antiplatelets: Secondary | ICD-10-CM | POA: Diagnosis not present

## 2020-11-18 DIAGNOSIS — Z794 Long term (current) use of insulin: Secondary | ICD-10-CM | POA: Insufficient documentation

## 2020-11-18 DIAGNOSIS — E870 Hyperosmolality and hypernatremia: Secondary | ICD-10-CM | POA: Diagnosis not present

## 2020-11-18 DIAGNOSIS — Z7982 Long term (current) use of aspirin: Secondary | ICD-10-CM | POA: Insufficient documentation

## 2020-11-18 DIAGNOSIS — Z8673 Personal history of transient ischemic attack (TIA), and cerebral infarction without residual deficits: Secondary | ICD-10-CM | POA: Diagnosis not present

## 2020-11-18 DIAGNOSIS — R7402 Elevation of levels of lactic acid dehydrogenase (LDH): Secondary | ICD-10-CM | POA: Insufficient documentation

## 2020-11-18 DIAGNOSIS — F17200 Nicotine dependence, unspecified, uncomplicated: Secondary | ICD-10-CM | POA: Diagnosis not present

## 2020-11-18 DIAGNOSIS — E1165 Type 2 diabetes mellitus with hyperglycemia: Secondary | ICD-10-CM | POA: Insufficient documentation

## 2020-11-18 DIAGNOSIS — E785 Hyperlipidemia, unspecified: Secondary | ICD-10-CM

## 2020-11-18 DIAGNOSIS — Z7984 Long term (current) use of oral hypoglycemic drugs: Secondary | ICD-10-CM | POA: Diagnosis not present

## 2020-11-18 DIAGNOSIS — E872 Acidosis, unspecified: Secondary | ICD-10-CM

## 2020-11-18 DIAGNOSIS — Y900 Blood alcohol level of less than 20 mg/100 ml: Secondary | ICD-10-CM | POA: Diagnosis not present

## 2020-11-18 DIAGNOSIS — R739 Hyperglycemia, unspecified: Secondary | ICD-10-CM | POA: Diagnosis present

## 2020-11-18 DIAGNOSIS — G629 Polyneuropathy, unspecified: Secondary | ICD-10-CM

## 2020-11-18 DIAGNOSIS — R7989 Other specified abnormal findings of blood chemistry: Secondary | ICD-10-CM

## 2020-11-18 HISTORY — DX: Type 2 diabetes mellitus with hyperosmolarity without nonketotic hyperglycemic-hyperosmolar coma (NKHHC): E11.00

## 2020-11-18 LAB — BETA-HYDROXYBUTYRIC ACID: Beta-Hydroxybutyric Acid: 0.85 mmol/L — ABNORMAL HIGH (ref 0.05–0.27)

## 2020-11-18 LAB — RESP PANEL BY RT-PCR (FLU A&B, COVID) ARPGX2
Influenza A by PCR: NEGATIVE
Influenza B by PCR: NEGATIVE
SARS Coronavirus 2 by RT PCR: NEGATIVE

## 2020-11-18 LAB — CBG MONITORING, ED
Glucose-Capillary: >600 mg/dL (ref 70–99)
Glucose-Capillary: 466 mg/dL — ABNORMAL HIGH (ref 70–99)
Glucose-Capillary: 393 mg/dL — ABNORMAL HIGH (ref 70–99)
Glucose-Capillary: 275 mg/dL — ABNORMAL HIGH (ref 70–99)
Glucose-Capillary: 155 mg/dL — ABNORMAL HIGH (ref 70–99)
Glucose-Capillary: 118 mg/dL — ABNORMAL HIGH (ref 70–99)
Glucose-Capillary: 137 mg/dL — ABNORMAL HIGH (ref 70–99)
Glucose-Capillary: 128 mg/dL — ABNORMAL HIGH (ref 70–99)
Glucose-Capillary: 133 mg/dL — ABNORMAL HIGH (ref 70–99)
Glucose-Capillary: 122 mg/dL — ABNORMAL HIGH (ref 70–99)

## 2020-11-18 LAB — CBC WITH DIFFERENTIAL/PLATELET
Abs Immature Granulocytes: 0.03 10*3/uL (ref 0.00–0.07)
Basophils Absolute: 0 10*3/uL (ref 0.0–0.1)
Basophils Relative: 0 %
Eosinophils Absolute: 0 10*3/uL (ref 0.0–0.5)
Eosinophils Relative: 0 %
HCT: 36.7 % (ref 36.0–46.0)
Hemoglobin: 12.6 g/dL (ref 12.0–15.0)
Immature Granulocytes: 0 %
Lymphocytes Relative: 21 %
Lymphs Abs: 1.5 10*3/uL (ref 0.7–4.0)
MCH: 31.7 pg (ref 26.0–34.0)
MCHC: 34.3 g/dL (ref 30.0–36.0)
MCV: 92.2 fL (ref 80.0–100.0)
Monocytes Absolute: 0.6 10*3/uL (ref 0.1–1.0)
Monocytes Relative: 7 %
Neutro Abs: 5.3 10*3/uL (ref 1.7–7.7)
Neutrophils Relative %: 72 %
Platelets: 476 10*3/uL — ABNORMAL HIGH (ref 150–400)
RBC: 3.98 MIL/uL (ref 3.87–5.11)
RDW: 14 % (ref 11.5–15.5)
WBC: 7.4 10*3/uL (ref 4.0–10.5)
nRBC: 0 % (ref 0.0–0.2)

## 2020-11-18 LAB — OSMOLALITY: Osmolality: 341 mOsm/kg (ref 275–295)

## 2020-11-18 LAB — COMPREHENSIVE METABOLIC PANEL
ALT: 12 U/L (ref 0–44)
AST: 15 U/L (ref 15–41)
Albumin: 3 g/dL — ABNORMAL LOW (ref 3.5–5.0)
Alkaline Phosphatase: 110 U/L (ref 38–126)
Anion gap: 10 (ref 5–15)
BUN: 27 mg/dL — ABNORMAL HIGH (ref 6–20)
CO2: 31 mmol/L (ref 22–32)
Calcium: 9.3 mg/dL (ref 8.9–10.3)
Chloride: 96 mmol/L — ABNORMAL LOW (ref 98–111)
Creatinine, Ser: 0.78 mg/dL (ref 0.44–1.00)
GFR, Estimated: >60 mL/min (ref >60)
Glucose, Bld: 705 mg/dL (ref 70–99)
Potassium: 3.6 mmol/L (ref 3.5–5.1)
Sodium: 137 mmol/L (ref 135–145)
Total Bilirubin: 0.5 mg/dL (ref 0.3–1.2)
Total Protein: 7.3 g/dL (ref 6.5–8.1)

## 2020-11-18 LAB — BLOOD GAS, VENOUS
Acid-Base Excess: 8.7 mmol/L — ABNORMAL HIGH (ref 0.0–2.0)
Bicarbonate: 35 mmol/L — ABNORMAL HIGH (ref 20.0–28.0)
O2 Saturation: 61 %
Patient temperature: 37
pCO2, Ven: 54 mmHg (ref 44.0–60.0)
pH, Ven: 7.42 (ref 7.250–7.430)
pO2, Ven: 31 mmHg — CL (ref 32.0–45.0)

## 2020-11-18 LAB — LACTIC ACID, PLASMA
Lactic Acid, Venous: 3.1 mmol/L (ref 0.5–1.9)
Lactic Acid, Venous: 4 mmol/L (ref 0.5–1.9)
Lactic Acid, Venous: 3.2 mmol/L (ref 0.5–1.9)
Lactic Acid, Venous: 1.5 mmol/L (ref 0.5–1.9)

## 2020-11-18 LAB — GLUCOSE, CAPILLARY
Glucose-Capillary: 149 mg/dL — ABNORMAL HIGH (ref 70–99)
Glucose-Capillary: 82 mg/dL (ref 70–99)

## 2020-11-18 LAB — URINALYSIS, COMPLETE (UACMP) WITH MICROSCOPIC
Bilirubin Urine: NEGATIVE
Glucose, UA: >=500 mg/dL — AB
Hgb urine dipstick: NEGATIVE
Ketones, ur: 5 mg/dL — AB
Leukocytes,Ua: NEGATIVE
Nitrite: NEGATIVE
Protein, ur: NEGATIVE mg/dL
Specific Gravity, Urine: 1.03 (ref 1.005–1.030)
Squamous Epithelial / HPF: NONE SEEN (ref 0–5)
pH: 5 (ref 5.0–8.0)

## 2020-11-18 LAB — ETHANOL: Alcohol, Ethyl (B): <10 mg/dL (ref <10)

## 2020-11-18 LAB — PROTIME-INR
INR: 1 (ref 0.8–1.2)
Prothrombin Time: 13.3 seconds (ref 11.4–15.2)

## 2020-11-18 LAB — APTT: aPTT: 27 seconds (ref 24–36)

## 2020-11-18 LAB — PROCALCITONIN: Procalcitonin: 0.18 ng/mL

## 2020-11-18 MED ORDER — INSULIN DETEMIR 100 UNIT/ML ~~LOC~~ SOLN
10.0000 [IU] | Freq: Two times a day (BID) | SUBCUTANEOUS | Status: DC
  Administered 2020-11-18: 10 [IU] via SUBCUTANEOUS
  Filled 2020-11-18 (×5): qty 0.1

## 2020-11-18 MED ORDER — POTASSIUM CHLORIDE 10 MEQ/100ML IV SOLN
10.0000 meq | INTRAVENOUS | Status: AC
Start: 2020-11-18 — End: 2020-11-18
  Administered 2020-11-18 (×2): 10 meq via INTRAVENOUS
  Filled 2020-11-18 (×2): qty 100

## 2020-11-18 MED ORDER — MAGNESIUM HYDROXIDE 400 MG/5ML PO SUSP: 30.0000 mL | Freq: Every day | ORAL | Status: DC | PRN

## 2020-11-18 MED ORDER — ATORVASTATIN CALCIUM 20 MG PO TABS
80.0000 mg | ORAL_TABLET | Freq: Every evening | ORAL | Status: DC
  Administered 2020-11-18: 80 mg via ORAL
  Filled 2020-11-18: qty 4

## 2020-11-18 MED ORDER — TRAZODONE HCL 50 MG PO TABS: 25.0000 mg | ORAL_TABLET | Freq: Every evening | ORAL | Status: DC | PRN

## 2020-11-18 MED ORDER — GABAPENTIN 600 MG PO TABS
600.0000 mg | ORAL_TABLET | Freq: Every day | ORAL | Status: DC
  Administered 2020-11-18: 600 mg via ORAL
  Filled 2020-11-18: qty 1

## 2020-11-18 MED ORDER — ONDANSETRON HCL 4 MG/2ML IJ SOLN: 4.0000 mg | Freq: Four times a day (QID) | INTRAMUSCULAR | Status: DC | PRN

## 2020-11-18 MED ORDER — CITALOPRAM HYDROBROMIDE 10 MG PO TABS
10.0000 mg | ORAL_TABLET | Freq: Every day | ORAL | Status: DC
  Administered 2020-11-18 – 2020-11-19 (×2): 10 mg via ORAL
  Filled 2020-11-18 (×2): qty 1

## 2020-11-18 MED ORDER — DEXTROSE 50 % IV SOLN: 0.0000 mL | INTRAVENOUS | Status: DC | PRN

## 2020-11-18 MED ORDER — CLOPIDOGREL BISULFATE 75 MG PO TABS
75.0000 mg | ORAL_TABLET | Freq: Every day | ORAL | Status: DC
  Administered 2020-11-18 – 2020-11-19 (×2): 75 mg via ORAL
  Filled 2020-11-18 (×2): qty 1

## 2020-11-18 MED ORDER — HYDROXYZINE HCL 10 MG PO TABS
10.0000 mg | ORAL_TABLET | Freq: Two times a day (BID) | ORAL | Status: DC
  Administered 2020-11-18 (×2): 10 mg via ORAL
  Filled 2020-11-18 (×5): qty 1

## 2020-11-18 MED ORDER — DEXTROSE IN LACTATED RINGERS 5 % IV SOLN: INTRAVENOUS | Status: DC

## 2020-11-18 MED ORDER — SODIUM CHLORIDE 0.9 % IV BOLUS
1000.0000 mL | Freq: Once | INTRAVENOUS | Status: AC
  Administered 2020-11-18: 1000 mL via INTRAVENOUS

## 2020-11-18 MED ORDER — ASPIRIN 81 MG PO CHEW
81.0000 mg | CHEWABLE_TABLET | Freq: Every day | ORAL | Status: DC
  Administered 2020-11-18 – 2020-11-19 (×2): 81 mg via ORAL
  Filled 2020-11-18 (×2): qty 1

## 2020-11-18 MED ORDER — CARVEDILOL 3.125 MG PO TABS
3.1250 mg | ORAL_TABLET | Freq: Two times a day (BID) | ORAL | Status: DC
  Administered 2020-11-18 – 2020-11-19 (×3): 3.125 mg via ORAL
  Filled 2020-11-18 (×3): qty 1

## 2020-11-18 MED ORDER — INSULIN ASPART 100 UNIT/ML IJ SOLN
3.0000 [IU] | Freq: Three times a day (TID) | INTRAMUSCULAR | Status: DC
  Administered 2020-11-18 – 2020-11-19 (×2): 3 [IU] via SUBCUTANEOUS
  Filled 2020-11-18 (×2): qty 1

## 2020-11-18 MED ORDER — INSULIN DETEMIR 100 UNIT/ML ~~LOC~~ SOLN
10.0000 [IU] | SUBCUTANEOUS | Status: DC
  Filled 2020-11-18: qty 0.1

## 2020-11-18 MED ORDER — LACTATED RINGERS IV SOLN: INTRAVENOUS | Status: DC

## 2020-11-18 MED ORDER — ENOXAPARIN SODIUM 40 MG/0.4ML IJ SOSY
40.0000 mg | PREFILLED_SYRINGE | INTRAMUSCULAR | Status: DC
  Administered 2020-11-18: 40 mg via SUBCUTANEOUS
  Filled 2020-11-18 (×2): qty 0.4

## 2020-11-18 MED ORDER — INSULIN DETEMIR 100 UNIT/ML ~~LOC~~ SOLN
10.0000 [IU] | SUBCUTANEOUS | Status: DC
  Administered 2020-11-18: 10 [IU] via SUBCUTANEOUS
  Filled 2020-11-18: qty 0.1

## 2020-11-18 MED ORDER — PANCRELIPASE (LIP-PROT-AMYL) 12000-38000 UNITS PO CPEP
24000.0000 [IU] | ORAL_CAPSULE | Freq: Three times a day (TID) | ORAL | Status: DC
  Administered 2020-11-18 – 2020-11-19 (×3): 24000 [IU] via ORAL
  Filled 2020-11-18 (×7): qty 2

## 2020-11-18 MED ORDER — MIRTAZAPINE 15 MG PO TABS
15.0000 mg | ORAL_TABLET | Freq: Every day | ORAL | Status: DC
  Administered 2020-11-18: 15 mg via ORAL
  Filled 2020-11-18: qty 1

## 2020-11-18 MED ORDER — ROPINIROLE HCL 1 MG PO TABS
1.0000 mg | ORAL_TABLET | Freq: Two times a day (BID) | ORAL | Status: DC
  Administered 2020-11-18 – 2020-11-19 (×3): 1 mg via ORAL
  Filled 2020-11-18 (×4): qty 1

## 2020-11-18 MED ORDER — HYDROXYCHLOROQUINE SULFATE 200 MG PO TABS
200.0000 mg | ORAL_TABLET | Freq: Every day | ORAL | Status: DC
  Administered 2020-11-18 – 2020-11-19 (×2): 200 mg via ORAL
  Filled 2020-11-18 (×2): qty 1

## 2020-11-18 MED ORDER — ADULT MULTIVITAMIN W/MINERALS CH
1.0000 | ORAL_TABLET | Freq: Every day | ORAL | Status: DC
  Administered 2020-11-18 – 2020-11-19 (×2): 1 via ORAL
  Filled 2020-11-18 (×2): qty 1

## 2020-11-18 MED ORDER — INSULIN ASPART 100 UNIT/ML IJ SOLN
0.0000 [IU] | Freq: Every day | INTRAMUSCULAR | Status: DC
Start: 2020-11-18 — End: 2020-11-19

## 2020-11-18 MED ORDER — INSULIN ASPART 100 UNIT/ML IJ SOLN: 3.0000 [IU] | Freq: Three times a day (TID) | INTRAMUSCULAR | Status: DC

## 2020-11-18 MED ORDER — DIVALPROEX SODIUM 125 MG PO CSDR
250.0000 mg | DELAYED_RELEASE_CAPSULE | Freq: Two times a day (BID) | ORAL | Status: DC
  Administered 2020-11-18 (×2): 250 mg via ORAL
  Filled 2020-11-18 (×5): qty 2

## 2020-11-18 MED ORDER — ACETAMINOPHEN 650 MG RE SUPP: 650.0000 mg | Freq: Four times a day (QID) | RECTAL | Status: DC | PRN

## 2020-11-18 MED ORDER — FOLIC ACID 1 MG PO TABS
1.0000 mg | ORAL_TABLET | Freq: Every day | ORAL | Status: DC
  Administered 2020-11-18 – 2020-11-19 (×2): 1 mg via ORAL
  Filled 2020-11-18 (×2): qty 1

## 2020-11-18 MED ORDER — BENAZEPRIL HCL 5 MG PO TABS
5.0000 mg | ORAL_TABLET | Freq: Every day | ORAL | Status: DC
  Administered 2020-11-18 – 2020-11-19 (×2): 5 mg via ORAL
  Filled 2020-11-18 (×2): qty 1

## 2020-11-18 MED ORDER — ONDANSETRON HCL 4 MG PO TABS: 4.0000 mg | ORAL_TABLET | Freq: Four times a day (QID) | ORAL | Status: DC | PRN

## 2020-11-18 MED ORDER — INSULIN REGULAR(HUMAN) IN NACL 100-0.9 UT/100ML-% IV SOLN
INTRAVENOUS | Status: DC
  Administered 2020-11-18: 10 [IU]/h via INTRAVENOUS
  Filled 2020-11-18: qty 100

## 2020-11-18 MED ORDER — INSULIN ASPART 100 UNIT/ML IJ SOLN
0.0000 [IU] | Freq: Three times a day (TID) | INTRAMUSCULAR | Status: DC
  Administered 2020-11-18: 1 [IU] via SUBCUTANEOUS
  Administered 2020-11-19: 2 [IU] via SUBCUTANEOUS
  Filled 2020-11-18 (×2): qty 1

## 2020-11-18 MED ORDER — MAGNESIUM OXIDE -MG SUPPLEMENT 400 (240 MG) MG PO TABS
400.0000 mg | ORAL_TABLET | Freq: Two times a day (BID) | ORAL | Status: DC
  Administered 2020-11-18 – 2020-11-19 (×3): 400 mg via ORAL
  Filled 2020-11-18 (×3): qty 1

## 2020-11-18 MED ORDER — LACTATED RINGERS IV BOLUS (SEPSIS)
1000.0000 mL | Freq: Once | INTRAVENOUS | Status: AC
  Administered 2020-11-18: 1000 mL via INTRAVENOUS

## 2020-11-18 MED ORDER — ACETAMINOPHEN 325 MG PO TABS: 650.0000 mg | ORAL_TABLET | Freq: Four times a day (QID) | ORAL | Status: DC | PRN

## 2020-11-18 NOTE — ED Notes (Signed)
[place] RN aware of assigned bed 

## 2020-11-18 NOTE — ED Notes (Signed)
MD aware of Critical glucose & lactate.

## 2020-11-18 NOTE — ED Notes (Signed)
MD forbach aware of critical serum osmolality.

## 2020-11-18 NOTE — ED Notes (Signed)
Report given to [place] RN - All questions answered.

## 2020-11-18 NOTE — ED Notes (Signed)
Message sent to pharmacy for Levemir.

## 2020-11-18 NOTE — ED Triage Notes (Signed)
Pt BIBA from home. Per EMS daughter was braiding pt's hair when she notoiced pt "became more weak and altered". Daughter checks CBG at home which reads high, daughter gives 28 units of insulin. Pt with 2 previous CVA, last CVA 2 weeks PTA with noted aphasia. Pt is non compliant with medications including plavix. Ambulatory to EMS gurney.   Pt also sustained unwitnessed fall at unknown time yesterday. Pt is now non verbal.   Per EMS, CBG 537 en route.  Upon arrival, pt moving all extremities ad lib.

## 2020-11-18 NOTE — Progress Notes (Signed)
Pt with difficulty expressing words/speech. Poor historian. Contacted daughter, Aleatha Taite.

## 2020-11-18 NOTE — ED Notes (Signed)
This RN at bedside, pt noted to have removed one of her IV's. Pt continues to sob and state " I want food and water now".
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
## 2020-11-18 NOTE — ED Notes (Signed)
Pt now speaking in clear sentences stating "I want food now". Pt educated to NPO status. Pt continues to cry out "I said now".

## 2020-11-18 NOTE — Progress Notes (Addendum)
Consult to start PIV. Patient currently with 2 PIV's. Primary RN states mainly needing blood draw. Instructed RN that IV start just for lab is not best practice, and that IV Team is not the back up for difficult lab draws. No further action taken at this time.

## 2020-11-18 NOTE — ED Notes (Signed)
Lab at bedside

## 2020-11-18 NOTE — ED Notes (Signed)
Pt OTF with CT.    IV consult placed. Lab unable to obtain specimen.

## 2020-11-18 NOTE — Progress Notes (Addendum)
Inpatient Diabetes Program Recommendations  AACE/ADA: New Consensus Statement on Inpatient Glycemic Control  Target Ranges:  Prepandial:   less than 140 mg/dL      Peak postprandial:   less than 180 mg/dL (1-2 hours)      Critically ill patients:  140 - 180 mg/dL   Results for Brittany [name], Brittany [name] (MRN [id number]) as of 11/18/2020 08:12  Ref. Range 11/18/2020 03:29 11/18/2020 06:02 11/18/2020 06:45 11/18/2020 07:43  Glucose-Capillary Latest Ref Range: 70 - 99 mg/dL >600 (HH) 466 (H) 393 (H) 275 (H)  Results for Brittany [name], Brittany [name] (MRN [id number]) as of 11/18/2020 08:12  Ref. Range 11/18/2020 04:40  CO2 Latest Ref Range: 22 - 32 mmol/L 31  Glucose Latest Ref Range: 70 - 99 mg/dL 705 (HH)  Anion gap Latest Ref Range: 5 - 15  10   Results for Brittany [name], Brittany [name] (MRN [id number]) as of 11/18/2020 08:12  Ref. Range 10/17/2018 13:17 05/28/2019 13:35 07/13/2019 05:17 11/05/2020 05:03 11/06/2020 04:53  Hemoglobin A1C Latest Ref Range: 4.8 - 5.6 % 11.7 (H) 12.5 (H) 13.5 (H) >15.5 (H) 15.3 (H)   Review of Glycemic Control  Diabetes history: DM2 Outpatient Diabetes medications: 70/30 28 units BID, Metformin 1000 mg BID Current orders for Inpatient glycemic control: IV insulin  Inpatient Diabetes Program Recommendations:    Insulin: Once provider is ready to transition from IV to SQ insulin, please consider ordering Levemir 10 units Q24H, Novolog 0-9 units Q4H, and Novolog 3 units TID with meals for meal coverage if patient eats at least 50% of meals.  NOTE: In reviewing chart, noted patient recently inpatient 11/04/20-11/08/20 with acute CVA; patient was discharged home (lives with daughter) with home health.  Per Care Everywhere, patient seen PCP on 11/10/20 and glucose at office visit was 413 mg/dl, A1C was >14%; Minimal compliance with insulin. Patient's initial glucose 705 mg/dl on 11/18/20 and IV insulin was started which is still ordered.   Addendum 11/18/20@12 :35-Spoke with patient at bedside. Patient is alert, lying in bed  covered up. Patient is able to talk but has trouble at times finding the right words and getting it out. Patient states that she is giving herself her insulin injections. She states she is taking 70/30 insulin BID but she is not able to tell me how much she is taking (holds her thumb and index finger apart and says it is about this much in syringe). Patient is giving her insulin injections in her left arm (says it is easier for her to give injection in that one area). Examined left arm and patient has a large knot (about the size of an egg) on left arm. Discussed that if she has scar tissue in that area, insulin may not be getting absorbed. Asked patient NOT to use her left arm for insulin injections. Discussed other sites she could use and encouraged her to rotate sites. Patient states that her daughter and kids live with her but her daughter does not provide her with much help. Asked if her daughter could help her with insulin injections and she stated "No. She isn't any help to me. I do it myself."  Patient reports that she checks her own glucose and it has been 300-400's lately. Discussed A1C of 15.3% on 11/06/20 indicating an average glucose of 392 mg/dl.  Discussed glucose and A1C goals. Discussed importance of checking CBGs and maintaining good CBG control to prevent long-term and short-term complications. Explained how hyperglycemia leads to damage within blood vessels which lead to the common complications  seen with uncontrolled diabetes. Stressed to the patient the importance of improving glycemic control to prevent further complications from uncontrolled diabetes especially given recent CVA. Discussed impact of nutrition, exercise, stress, sickness, and medications on diabetes control. Again reminded patient to take insulin as prescribed, to stay away from given any injections in left arm, and to follow up with PCP regarding improving DM control. Patient states she has everything she needs at home for DM  management.  Patient verbalized understanding of information discussed and reports no further questions at this time related to diabetes.  Thanks, Barnie Alderman, RN, MSN, CDE Diabetes Coordinator Inpatient Diabetes Program [phone] (Team Pager from 8am to 5pm)

## 2020-11-18 NOTE — ED Notes (Signed)
MD aware of difficulty obtaining blood at this time. Lab called.

## 2020-11-18 NOTE — Progress Notes (Signed)
Called by RN patient lactic acid has gone up to 4.0. Reviewed cart again, no UTI, chest x ray no pneumonia. Procalcitonin 0.18. Patient was taking metformin 1000 mg daily at home. This is most likely due to metformin. I will give 1 liter of NS bolus, recheck lactic acid level. No need for antibiotics.

## 2020-11-18 NOTE — H&P (Signed)
[place]   PATIENT NAME: Brittany [name]    MR#:  [id number]  DATE OF BIRTH:  Aug 07, 1960  DATE OF ADMISSION:  11/18/2020  PRIMARY CARE PHYSICIAN: [facility]   Patient is coming from: Home  REQUESTING/REFERRING PHYSICIAN: Hinda Kehr, MD  CHIEF COMPLAINT:   Chief Complaint  Patient presents with   Altered Mental Status    HISTORY OF PRESENT ILLNESS:  Brittany [name] is a 60 y.o. African-American female with medical history significant for II diabetes mellitus, hypertension, CVA, lupus and collagen vascular disease and recent left acute occipital infarct for which she was admitted here from 9/20 3-10/2025, who presented emergency room with acute onset of altered mental status with worsening confusion.  The patient denies any nausea or vomiting or diarrhea.  She was fairly emotional during the interview and trying to drink water indicating likely polydipsia but much more responsive and at the time of her presentation to the ER.  She denied any dyspnea or cough or wheezing.  No chest pain or palpitations.  No new paresthesias or focal muscle weakness.  ED Course: When she came to the ER blood pressure was 135/93 with otherwise normal vital signs.  Labs revealed a VBG with pH 7.42 and HCO3 of 35.  CMP was remarkable for a blood glucose of 705 and a BUN of 27 with anion gap of 10 and albumin of 3.  Lactic acid was 3.1 and serum osmolality was 341 with procalcitonin of 0.18.  CBC was remarkable for thrombocytosis of 476.  Influenza antigens and COVID-19 PCR came back negative.  UA showed well 500 glucose and alcohol levels less than 10.  Blood culture was drawn. EKG as reviewed by me : Chest x-ray showed no acute cardiopulmonary disease. Imaging: Noncontrast head CT scan revealed a left PCA territory infarction with petechial hemorrhages seen by MRI with no malignant hemorrhagic transformation or mass-effect and no new intracranial abnormality.  The patient was given IV  insulin drip per Endo tool HHS protocol and hydration with IV lactated Ringer.  She will be admitted to stepdown unit bed for further evaluation and management. PAST MEDICAL HISTORY:   Past Medical History:  Diagnosis Date   Cancer ([place])    Collagen vascular disease ([place])    Diabetes mellitus without complication ([place])    Hypertension    Lupus ([place])     PAST SURGICAL HISTORY:   Past Surgical History:  Procedure Laterality Date   PANCREATECTOMY     spleenectomy      SOCIAL HISTORY:   Social History   Tobacco Use   Smoking status: Every Day   Smokeless tobacco: Never  Substance Use Topics   Alcohol use: Yes    FAMILY HISTORY:   Family History  Problem Relation Age of Onset   Anxiety disorder Sister    Breast cancer Maternal Aunt     DRUG ALLERGIES:   Allergies  Allergen Reactions   Cephalosporins Itching   Hydromorphone Itching   Keflin [Cephalothin] Itching    REVIEW OF SYSTEMS:   ROS As per history of present illness. All pertinent systems were reviewed above. Constitutional, HEENT, cardiovascular, respiratory, GI, GU, musculoskeletal, neuro, psychiatric, endocrine, integumentary and hematologic systems were reviewed and are otherwise negative/unremarkable except for positive findings mentioned above in the HPI.   MEDICATIONS AT HOME:   Prior to Admission medications   Medication Sig Start Date End Date Taking? Authorizing Provider  buPROPion (WELLBUTRIN XL) 300 MG 24 hr tablet Take  300 mg by mouth daily. 08/26/20 08/26/21 Yes [provider]  aspirin 81 MG chewable tablet Chew 1 tablet (81 mg total) by mouth daily. 07/20/19   Sharen Hones, MD  atorvastatin (LIPITOR) 80 MG tablet Take 1 tablet (80 mg total) by mouth Nightly. 11/08/20 12/08/20  Pokhrel, Corrie Mckusick, MD  benazepril (LOTENSIN) 10 MG tablet Take 0.5 tablets (5 mg total) by mouth daily. 08/04/19   Love, Ivan Anchors, PA-C  carvedilol (COREG) 3.125 MG tablet Take 1 tablet (3.125 mg total) by mouth 2  (two) times daily with a meal. 08/04/19   Love, Ivan Anchors, PA-C  citalopram (CELEXA) 10 MG tablet Take 1 tablet (10 mg total) by mouth daily. 08/05/19   Love, Ivan Anchors, PA-C  clopidogrel (PLAVIX) 75 MG tablet Take 1 tablet (75 mg total) by mouth daily for 21 days. 11/08/20 11/29/20  Pokhrel, Corrie Mckusick, MD  divalproex (DEPAKOTE SPRINKLE) 125 MG capsule Take 2 capsules (250 mg total) by mouth every 12 (twelve) hours. 08/04/19   Love, Ivan Anchors, PA-C  folic acid (FOLVITE) 1 MG tablet Take 1 mg by mouth daily. 09/13/17   [provider]  gabapentin (NEURONTIN) 600 MG tablet Take 1 tablet (600 mg total) by mouth at bedtime. 08/04/19   Love, Ivan Anchors, PA-C  hydroxychloroquine (PLAQUENIL) 200 MG tablet Take 200 mg by mouth daily.  05/26/18   [provider]  hydrOXYzine (ATARAX/VISTARIL) 10 MG tablet Take 10 mg by mouth 2 (two) times daily.  12/31/16   [provider]  insulin NPH-regular Human (NOVOLIN 70/30) (70-30) 100 UNIT/ML injection Inject 28 Units into the skin 2 (two) times daily with a meal. 08/04/19   Love, Pamela S, PA-C  KLOR-CON M20 20 MEQ tablet Take 40 mEq by mouth 2 (two) times daily. 10/07/20   [provider]  magnesium oxide (MAG-OX) 400 (240 Mg) MG tablet Take 1 tablet (400 mg total) by mouth 2 (two) times daily for 10 days. 11/08/20 11/18/20  Pokhrel, Corrie Mckusick, MD  Menthol-Methyl Salicylate (MUSCLE RUB) 10-15 % CREA Apply 1 application topically 2 (two) times daily as needed for muscle pain. 08/04/19   Love, Ivan Anchors, PA-C  metFORMIN (GLUCOPHAGE) 1000 MG tablet Take 1 tablet (1,000 mg total) by mouth daily with breakfast. 08/04/19 07/08/21  Love, Ivan Anchors, PA-C  mirtazapine (REMERON) 15 MG tablet Take 1 tablet (15 mg total) by mouth at bedtime. 11/08/20 11/08/21  Pokhrel, Corrie Mckusick, MD  Multiple Vitamin (MULTIVITAMIN WITH MINERALS) TABS tablet Take 1 tablet by mouth daily. 08/05/19   Love, Ivan Anchors, PA-C  nicotine polacrilex (NICORETTE) 2 MG gum Take 1 each (2 mg total) by mouth  as needed for smoking cessation. 08/04/19   Love, Ivan Anchors, PA-C  ondansetron (ZOFRAN ODT) 4 MG disintegrating tablet Take 1 tablet (4 mg total) by mouth every 8 (eight) hours as needed. 03/14/20   Rudene Re, MD  Pancrelipase, Lip-Prot-Amyl, 24000-76000 units CPEP Take 3 capsules by mouth 3 (three) times daily. 05/26/18   [provider]  rOPINIRole (REQUIP) 1 MG tablet Take 1 tablet (1 mg total) by mouth 2 (two) times daily. 08/04/19   Love, Ivan Anchors, PA-C  traMADol (ULTRAM) 50 MG tablet Take 1 tablet (50 mg total) by mouth every 6 (six) hours as needed. 03/14/20 03/14/21  Rudene Re, MD      VITAL SIGNS:  Blood pressure (!) 144/85, pulse 78, temperature 98.2 F (36.8 C), temperature source Rectal, resp. rate 14, weight 46.7 kg, SpO2 93 %.  PHYSICAL EXAMINATION:  Physical Exam  GENERAL:  60 y.o.-year-old A African-American female patient lying in the bed with no acute distress.  She was emotional and crying to drink water. EYES: Pupils equal, round, reactive to light and accommodation. No scleral icterus. Extraocular muscles intact.  HEENT: Head atraumatic, normocephalic. Oropharynx and nasopharynx clear.  NECK:  Supple, no jugular venous distention. No thyroid enlargement, no tenderness.  LUNGS: Normal breath sounds bilaterally, no wheezing, rales,rhonchi or crepitation. No use of accessory muscles of respiration.  CARDIOVASCULAR: Regular rate and rhythm, S1, S2 normal. No murmurs, rubs, or gallops.  ABDOMEN: Soft, nondistended, nontender. Bowel sounds present. No organomegaly or mass.  EXTREMITIES: No pedal edema, cyanosis, or clubbing.  NEUROLOGIC: Cranial nerves II through XII are intact. Muscle strength 5/5 in all extremities. Sensation intact. Gait not checked.  PSYCHIATRIC: The patient is alert and cooperative with exam..  She had good eye contact but was crying. SKIN: No obvious rash, lesion, or ulcer.   LABORATORY PANEL:   CBC Recent Labs  Lab  11/18/20 0440  WBC 7.4  HGB 12.6  HCT 36.7  PLT 476*   ------------------------------------------------------------------------------------------------------------------  Chemistries  Recent Labs  Lab 11/18/20 0440  NA 137  K 3.6  CL 96*  CO2 31  GLUCOSE 705*  BUN 27*  CREATININE 0.78  CALCIUM 9.3  AST 15  ALT 12  ALKPHOS 110  BILITOT 0.5   ------------------------------------------------------------------------------------------------------------------  Cardiac Enzymes No results for input(s): TROPONINI in the last 168 hours. ------------------------------------------------------------------------------------------------------------------  RADIOLOGY:  CT Head Wo Contrast  Result Date: 11/18/2020 CLINICAL DATA:  60 year old female with altered mental status. Weakness. Recent large left PCA infarct. EXAM: CT HEAD WITHOUT CONTRAST TECHNIQUE: Contiguous axial images were obtained from the base of the skull through the vertex without intravenous contrast. COMPARISON:  Brain MRI 11/04/2020.  Head CT 11/06/2020. FINDINGS: Brain: Mildly regressed left PCA territory cytotoxic edema since 11/06/2020. Redemonstrated petechial hemorrhage since the MRI but no malignant hemorrhagic transformation. No regional mass effect. Chronic left cerebellar infarct and confluent frontal lobe white matter hypodensity appears stable. Chronic lacunar infarct of the posterior left deep gray nuclei is stable. Small chronic right middle frontal gyrus infarct appears stable. No midline shift, ventriculomegaly, mass effect, evidence of mass lesion, intracranial hemorrhage or new cortically based acute infarction. Vascular: Mild Calcified atherosclerosis at the skull base. No suspicious intracranial vascular hyperdensity. Skull: No acute osseous abnormality identified. Sinuses/Orbits: Visualized paranasal sinuses and mastoids are stable and well aerated. Other: Visualized orbits and scalp soft tissues are within  normal limits. IMPRESSION: 1. Expected evolution of the recent left PCA territory infarct. Petechial hemorrhage as seen by MRI, but no malignant hemorrhagic transformation or mass effect. 2. No new intracranial abnormality. Other chronic ischemic disease appears stable. Electronically Signed   By: Genevie Ann M.D.   On: 11/18/2020 04:58   DG Chest Port 1 View  Result Date: 11/18/2020 CLINICAL DATA:  60 year old female with altered mental status. Weakness. Recent large left PCA infarct. Smoker. EXAM: PORTABLE CHEST 1 VIEW COMPARISON:  Chest radiographs 11/04/2020. FINDINGS: Portable AP semi upright view at 0425 hours. Improved lung volumes. Stable borderline cardiomegaly. Other mediastinal contours are within normal limits. Visualized tracheal air column is within normal limits. Stable lung markings. Allowing for portable technique the lungs are clear. No pneumothorax or pleural effusion. Stable left upper quadrant surgical clips. Stable visualized osseous structures. IMPRESSION: No acute cardiopulmonary abnormality. Electronically Signed   By: Genevie Ann M.D.   On: 11/18/2020 04:59      IMPRESSION AND PLAN:  Active Problems:   Hyperosmolar hyperglycemic state (HHS) ([place])    1.  Uncontrolled type 2 diabetes mellitus with nonketotic hyperosmolar hyperglycemia and metabolic encephalopathy. -The patient will be admitted to stepdown unit bed. - She will be continued on IV insulin drip per her Endo tool HHS protocol. - We will continue hydration with IV lactated Ringer per protocol. - Metabolic cephalopathy is currently improving.  2.  Recent acute left occipital cerebral infarction. - We will continue her aspirin and Plavix. - Her ultimate status is resolved with management and she has no further new neurological deficits. - We will continue Keppra for seizures prophylaxis. - We will continue her statin therapy. - We will follow neurochecks every 4 hours for 24 hours.  3.  Essential hypertension. - We  will continue Lotensin and Coreg with permissive parameters.  4.  Dyslipidemia. - We will continue statin therapy.  5.  Restless leg syndrome. - We will continue her Requip.  6.  Pancreatic insufficiency, status post pancreatectomy. - We will continue her pancrelipase.  7.  Peripheral neuropathy. - We will continue Neurontin.  8.  Depression. - We will continue Wellbutrin XL.   DVT prophylaxis: Lovenox. Code Status: full code. Family Communication:  The plan of care was discussed in details with the patient (and family). I answered all questions. The patient agreed to proceed with the above mentioned plan. Further management will depend upon hospital course. Disposition Plan: Back to previous home environment Consults called: none.  All the records are reviewed and case discussed with ED provider.  Status is: Inpatient  Remains inpatient appropriate because:Altered mental status, Ongoing diagnostic testing needed not appropriate for outpatient work up, Unsafe d/c plan, IV treatments appropriate due to intensity of illness or inability to take PO, and Inpatient level of care appropriate due to severity of illness  Dispo: The patient is from: Home              Anticipated d/c is to: Home              Patient currently is not medically stable to d/c.   Difficult to place patient No   TOTAL TIME TAKING CARE OF THIS PATIENT: 55 minutes.    Christel Mormon M.D on 11/18/2020 at 6:36 AM  [facility]   From 7 PM-7 AM, contact night-coverage [email or web address]  CC: Primary care physician; [facility]

## 2020-11-18 NOTE — Progress Notes (Signed)
PROGRESS NOTE    Brittany [name]  JOI:[id number] DOB: 1960/04/22 DOA: 11/18/2020 PCP: [facility]    Brief Narrative:  Brittany [name] is a 60 y.o. African-American female with medical history significant for II diabetes mellitus, hypertension, CVA, lupus and collagen vascular disease and recent left acute occipital infarct for which she was admitted here from 9/20 3-10/2025, who presented emergency room with acute onset of altered mental status with worsening confusion. Glucose was 476, she was started on fluids, she was also started on IV insulin drip.   Assessment & Plan:   Active Problems:   Hyperosmolar hyperglycemic state (HHS) ([place])  Uncontrolled type 2 diabetes mellitus with nonketotic hyperosmolar hyperglycemia and metabolic encephalopathy. Patient received fluids and insulin drip.  Glucose is already better. I will transition to Levemir twice a day and a sliding scale insulin and scheduled short acting insulin.  I will adjust insulin dose as needed.  Recent stroke. I reviewed patient CT head, no new stroke. I will start a diet, continue Keppra for seizure prophylaxis. Continue statin.  Essential hypertension plan Continue lotenson and Coreg.  Pancreatic insufficiency. Continue pancreatic enzymes.  DVT prophylaxis: Lovenox Code Status: full Family Communication:  Disposition Plan:    Status is: Inpatient  Remains inpatient appropriate because:Altered mental status and Inpatient level of care appropriate due to severity of illness  Dispo: The patient is from: Home              Anticipated d/c is to: Home              Patient currently is not medically stable to d/c.   Difficult to place patient Yes        I/O last 3 completed shifts: In: 1000 [IV Piggyback:1000] Out: -  Total I/O In: 468.6 [I.V.:268.6; IV Piggyback:200] Out: -      Consultants:  None  Procedures: None  Antimicrobials: None  Subjective: Patient still has some  confusion, glucose much better. Denies any short of breath or cough. No fever or chills.  Objective: Vitals:   11/18/20 0900 11/18/20 1100 11/18/20 1130 11/18/20 1200  BP: (!) 126/93 (!) 145/91 (!) 143/92 (!) 159/95  Pulse: 87 71 72 70  Resp: 13 15 14 16   Temp:      TempSrc:      SpO2: 95% 95% 95% 96%  Weight:        Intake/Output Summary (Last 24 hours) at 11/18/2020 1409 Last data filed at 11/18/2020 0849 Gross per 24 hour  Intake 1468.59 ml  Output --  Net 1468.59 ml   Filed Weights   11/18/20 0342  Weight: 46.7 kg    Examination:  General exam: Appears calm and comfortable  Respiratory system: Clear to auscultation. Respiratory effort normal. Cardiovascular system: S1 & S2 heard, RRR. No JVD, murmurs, rubs, gallops or clicks. No pedal edema. Gastrointestinal system: Abdomen is nondistended, soft and nontender. No organomegaly or masses felt. Normal bowel sounds heard. Central nervous system: Alert and oriented x2. No focal neurological deficits. Extremities: Symmetric 5 x 5 power. Skin: No rashes, lesions or ulcers Psychiatry: Mood & affect appropriate.     Data Reviewed: I have personally reviewed following labs and imaging studies  CBC: Recent Labs  Lab 11/18/20 0440  WBC 7.4  NEUTROABS 5.3  HGB 12.6  HCT 36.7  MCV 92.2  PLT 470*   Basic Metabolic Panel: Recent Labs  Lab 11/18/20 0440  NA 137  K 3.6  CL 96*  CO2 31  GLUCOSE  705*  BUN 27*  CREATININE 0.78  CALCIUM 9.3   GFR: Estimated Creatinine Clearance: 53.7 mL/min (by C-G formula based on SCr of 0.78 mg/dL). Liver Function Tests: Recent Labs  Lab 11/18/20 0440  AST 15  ALT 12  ALKPHOS 110  BILITOT 0.5  PROT 7.3  ALBUMIN 3.0*   No results for input(s): LIPASE, AMYLASE in the last 168 hours. No results for input(s): AMMONIA in the last 168 hours. Coagulation Profile: Recent Labs  Lab 11/18/20 0440  INR 1.0   Cardiac Enzymes: No results for input(s): CKTOTAL, CKMB,  CKMBINDEX, TROPONINI in the last 168 hours. BNP (last 3 results) No results for input(s): PROBNP in the last 8760 hours. HbA1C: No results for input(s): HGBA1C in the last 72 hours. CBG: Recent Labs  Lab 11/18/20 0843 11/18/20 0940 11/18/20 1037 11/18/20 1156 11/18/20 1257  GLUCAP 155* 118* 137* 128* 133*   Lipid Profile: No results for input(s): CHOL, HDL, LDLCALC, TRIG, CHOLHDL, LDLDIRECT in the last 72 hours. Thyroid Function Tests: No results for input(s): TSH, T4TOTAL, FREET4, T3FREE, THYROIDAB in the last 72 hours. Anemia Panel: No results for input(s): VITAMINB12, FOLATE, FERRITIN, TIBC, IRON, RETICCTPCT in the last 72 hours. Sepsis Labs: Recent Labs  Lab 11/18/20 0440 11/18/20 0441  PROCALCITON 0.18  --   LATICACIDVEN  --  3.1*    Recent Results (from the past 240 hour(s))  Resp Panel by RT-PCR (Flu A&B, Covid) Nasopharyngeal Swab     Status: None   Collection Time: 11/18/20  4:24 AM   Specimen: Nasopharyngeal Swab; Nasopharyngeal(NP) swabs in vial transport medium  Result Value Ref Range Status   SARS Coronavirus 2 by RT PCR NEGATIVE NEGATIVE Final    Comment: (NOTE) SARS-CoV-2 target nucleic acids are NOT DETECTED.  The SARS-CoV-2 RNA is generally detectable in upper respiratory specimens during the acute phase of infection. The lowest concentration of SARS-CoV-2 viral copies this assay can detect is 138 copies/mL. A negative result does not preclude SARS-Cov-2 infection and should not be used as the sole basis for treatment or other patient management decisions. A negative result may occur with  improper specimen collection/handling, submission of specimen other than nasopharyngeal swab, presence of viral mutation(s) within the areas targeted by this assay, and inadequate number of viral copies(<138 copies/mL). A negative result must be combined with clinical observations, patient history, and epidemiological information. The expected result is  Negative.  Fact Sheet for Patients:  EntrepreneurPulse.com.au  Fact Sheet for Healthcare Providers:  IncredibleEmployment.be  This test is no t yet approved or cleared by the [place] FDA and  has been authorized for detection and/or diagnosis of SARS-CoV-2 by FDA under an Emergency Use Authorization (EUA). This EUA will remain  in effect (meaning this test can be used) for the duration of the COVID-19 declaration under Section 564(b)(1) of the Act, 21 U.S.C.section 360bbb-3(b)(1), unless the authorization is terminated  or revoked sooner.       Influenza A by PCR NEGATIVE NEGATIVE Final   Influenza B by PCR NEGATIVE NEGATIVE Final    Comment: (NOTE) The Xpert Xpress SARS-CoV-2/FLU/RSV plus assay is intended as an aid in the diagnosis of influenza from Nasopharyngeal swab specimens and should not be used as a sole basis for treatment. Nasal washings and aspirates are unacceptable for Xpert Xpress SARS-CoV-2/FLU/RSV testing.  Fact Sheet for Patients: EntrepreneurPulse.com.au  Fact Sheet for Healthcare Providers: IncredibleEmployment.be  This test is not yet approved or cleared by the [place] and has been authorized for  detection and/or diagnosis of SARS-CoV-2 by FDA under an Emergency Use Authorization (EUA). This EUA will remain in effect (meaning this test can be used) for the duration of the COVID-19 declaration under Section 564(b)(1) of the Act, 21 U.S.C. section 360bbb-3(b)(1), unless the authorization is terminated or revoked.  Performed at [facility], [address] [place], [place] [address]   Blood culture (routine single)     Status: None (Preliminary result)   Collection Time: 11/18/20  4:41 AM   Specimen: BLOOD  Result Value Ref Range Status   Specimen Description BLOOD LEFT [facility]  Final   Special Requests   Final    BOTTLES DRAWN AEROBIC AND ANAEROBIC Blood  Culture adequate volume   Culture   Final    NO GROWTH < 12 HOURS Performed at [facility], [address], [place], [place] [address]    Report Status PENDING  Incomplete         Radiology Studies: CT Head Wo Contrast  Result Date: 11/18/2020 CLINICAL DATA:  60 year old female with altered mental status. Weakness. Recent large left PCA infarct. EXAM: CT HEAD WITHOUT CONTRAST TECHNIQUE: Contiguous axial images were obtained from the base of the skull through the vertex without intravenous contrast. COMPARISON:  Brain MRI 11/04/2020.  Head CT 11/06/2020. FINDINGS: Brain: Mildly regressed left PCA territory cytotoxic edema since 11/06/2020. Redemonstrated petechial hemorrhage since the MRI but no malignant hemorrhagic transformation. No regional mass effect. Chronic left cerebellar infarct and confluent frontal lobe white matter hypodensity appears stable. Chronic lacunar infarct of the posterior left deep gray nuclei is stable. Small chronic right middle frontal gyrus infarct appears stable. No midline shift, ventriculomegaly, mass effect, evidence of mass lesion, intracranial hemorrhage or new cortically based acute infarction. Vascular: Mild Calcified atherosclerosis at the skull base. No suspicious intracranial vascular hyperdensity. Skull: No acute osseous abnormality identified. Sinuses/Orbits: Visualized paranasal sinuses and mastoids are stable and well aerated. Other: Visualized orbits and scalp soft tissues are within normal limits. IMPRESSION: 1. Expected evolution of the recent left PCA territory infarct. Petechial hemorrhage as seen by MRI, but no malignant hemorrhagic transformation or mass effect. 2. No new intracranial abnormality. Other chronic ischemic disease appears stable. Electronically Signed   By: Genevie Ann M.D.   On: 11/18/2020 04:58   DG Chest Port 1 View  Result Date: 11/18/2020 CLINICAL DATA:  60 year old female with altered mental status. Weakness. Recent large  left PCA infarct. Smoker. EXAM: PORTABLE CHEST 1 VIEW COMPARISON:  Chest radiographs 11/04/2020. FINDINGS: Portable AP semi upright view at 0425 hours. Improved lung volumes. Stable borderline cardiomegaly. Other mediastinal contours are within normal limits. Visualized tracheal air column is within normal limits. Stable lung markings. Allowing for portable technique the lungs are clear. No pneumothorax or pleural effusion. Stable left upper quadrant surgical clips. Stable visualized osseous structures. IMPRESSION: No acute cardiopulmonary abnormality. Electronically Signed   By: Genevie Ann M.D.   On: 11/18/2020 04:59        Scheduled Meds:  aspirin  81 mg Oral Daily   atorvastatin  80 mg Oral Nightly   benazepril  5 mg Oral Daily   carvedilol  3.125 mg Oral BID WC   citalopram  10 mg Oral Daily   clopidogrel  75 mg Oral Daily   divalproex  250 mg Oral Q12H   enoxaparin (LOVENOX) injection  40 mg Subcutaneous E99B   folic acid  1 mg Oral Daily   gabapentin  600 mg Oral QHS   hydroxychloroquine  200 mg Oral Daily   hydrOXYzine  10 mg Oral BID   insulin aspart  0-5 Units Subcutaneous QHS   insulin aspart  0-9 Units Subcutaneous TID WC   insulin aspart  3 Units Subcutaneous TID WC   insulin detemir  10 Units Subcutaneous BID   lipase/protease/amylase  24,000 Units Oral TID   magnesium oxide  400 mg Oral BID   mirtazapine  15 mg Oral QHS   multivitamin with minerals  1 tablet Oral Daily   rOPINIRole  1 mg Oral BID   Continuous Infusions:  dextrose 5% lactated ringers 125 mL/hr at 11/18/20 0848     LOS: 0 days    Time spent: 27 minutes    Sharen Hones, MD [facility]   To contact the attending provider between 7A-7P or the covering provider during after hours 7P-7A, please log into the web site [email or web address] and access using universal [place] password for that web site. If you do not have the password, please call the hospital operator.  11/18/2020, 2:09 PM

## 2020-11-18 NOTE — ED Notes (Signed)
IV Team at bedside, states they will not start IV to obtain blood. IV team aware of acute need for blood r/t current status. "We do not back up lab".

## 2020-11-19 DIAGNOSIS — E872 Acidosis, unspecified: Secondary | ICD-10-CM

## 2020-11-19 DIAGNOSIS — R739 Hyperglycemia, unspecified: Secondary | ICD-10-CM | POA: Diagnosis present

## 2020-11-19 DIAGNOSIS — E1100 Type 2 diabetes mellitus with hyperosmolarity without nonketotic hyperglycemic-hyperosmolar coma (NKHHC): Secondary | ICD-10-CM | POA: Diagnosis not present

## 2020-11-19 HISTORY — DX: Acidosis, unspecified: E87.20

## 2020-11-19 LAB — CBC
HCT: 34.8 % — ABNORMAL LOW (ref 36.0–46.0)
Hemoglobin: 11.7 g/dL — ABNORMAL LOW (ref 12.0–15.0)
MCH: 31 pg (ref 26.0–34.0)
MCHC: 33.6 g/dL (ref 30.0–36.0)
MCV: 92.1 fL (ref 80.0–100.0)
Platelets: 458 10*3/uL — ABNORMAL HIGH (ref 150–400)
RBC: 3.78 MIL/uL — ABNORMAL LOW (ref 3.87–5.11)
RDW: 14.1 % (ref 11.5–15.5)
WBC: 11.3 10*3/uL — ABNORMAL HIGH (ref 4.0–10.5)
nRBC: 0 % (ref 0.0–0.2)

## 2020-11-19 LAB — BASIC METABOLIC PANEL
Anion gap: 7 (ref 5–15)
BUN: 10 mg/dL (ref 6–20)
CO2: 32 mmol/L (ref 22–32)
Calcium: 8.4 mg/dL — ABNORMAL LOW (ref 8.9–10.3)
Chloride: 99 mmol/L (ref 98–111)
Creatinine, Ser: 0.44 mg/dL (ref 0.44–1.00)
GFR, Estimated: >60 mL/min (ref >60)
Glucose, Bld: 236 mg/dL — ABNORMAL HIGH (ref 70–99)
Potassium: 3.1 mmol/L — ABNORMAL LOW (ref 3.5–5.1)
Sodium: 138 mmol/L (ref 135–145)

## 2020-11-19 LAB — GLUCOSE, CAPILLARY: Glucose-Capillary: 187 mg/dL — ABNORMAL HIGH (ref 70–99)

## 2020-11-19 LAB — PHOSPHORUS: Phosphorus: 2.7 mg/dL (ref 2.5–4.6)

## 2020-11-19 LAB — URINE CULTURE: Culture: NO GROWTH

## 2020-11-19 LAB — MAGNESIUM: Magnesium: 1.6 mg/dL — ABNORMAL LOW (ref 1.7–2.4)

## 2020-11-19 MED ORDER — POTASSIUM CHLORIDE 10 MEQ/100ML IV SOLN
10.0000 meq | INTRAVENOUS | Status: AC
  Administered 2020-11-19: 10 meq via INTRAVENOUS
  Filled 2020-11-19 (×2): qty 100

## 2020-11-19 MED ORDER — MAGNESIUM SULFATE 2 GM/50ML IV SOLN
2.0000 g | Freq: Once | INTRAVENOUS | Status: AC
  Administered 2020-11-19: 2 g via INTRAVENOUS
  Filled 2020-11-19: qty 50

## 2020-11-19 NOTE — Discharge Summary (Signed)
Physician Discharge Summary  Patient ID: Brittany [name] MRN: [id number] DOB/AGE: 10-14-1960 60 y.o.  Admit date: 11/18/2020 Discharge date: 11/19/2020  Admission Diagnoses:  Discharge Diagnoses:  Active Problems:   Hyperosmolar hyperglycemic state (HHS) [facility])   Discharged Condition: good  Hospital Course:  Brittany [name] is a 60 y.o. African-American female with medical history significant for II diabetes mellitus, hypertension, CVA, lupus and collagen vascular disease and recent left acute occipital infarct for which she was admitted here from 9/20 3-10/2025, who presented emergency room with acute onset of altered mental status with worsening confusion. Glucose was 476, she was started on fluids, she was also started on IV insulin drip.  Uncontrolled type 2 diabetes mellitus with nonketotic hyperosmolar hyperglycemia and metabolic encephalopathy. Patient received fluids and insulin drip.  Glucose is already better. Discussed with patient daughter, patient got her insulin bar late on the day of admission.  This is probably why his glucose running so high. Patient condition has improved to baseline, I will resume home insulins.  Lactic acidosis plan Patient lactic acid went up to 4.0, improved after giving fluids.  She does not appear to have any infection, this appears to be secondary to metformin.  At this point, I will discontinue metformin for now.  May restart in the future.  Recent stroke. I reviewed patient CT head, no new stroke. I will start a diet, continue Keppra for seizure prophylaxis. Continue statin.  Essential hypertension plan Continue lotenson and Coreg.   Pancreatic insufficiency. Continue pancreatic enzymes.    Consults: None  Significant Diagnostic Studies:   Treatments: iv fluid, insulin  Discharge Exam: Blood pressure (!) 147/96, pulse 78, temperature 98.7 F (37.1 C), resp. rate 16, weight 46.7 kg, SpO2 92 %. General appearance: alert and  cooperative Resp: clear to auscultation bilaterally Cardio: regular rate and rhythm, S1, S2 normal, no murmur, click, rub or gallop GI: soft, non-tender; bowel sounds normal; no masses,  no organomegaly Extremities: extremities normal, atraumatic, no cyanosis or edema  Disposition: Discharge disposition: 01-Home or Self Care       Discharge Instructions     Diet - low sodium heart healthy   Complete by: As directed    Increase activity slowly   Complete by: As directed       Allergies as of 11/19/2020       Reactions   Cephalosporins Itching   Hydromorphone Itching   Keflin [cephalothin] Itching        Medication List     STOP taking these medications    magnesium oxide 400 (240 Mg) MG tablet Commonly known as: MAG-OX   metFORMIN 1000 MG tablet Commonly known as: GLUCOPHAGE       TAKE these medications    aspirin 81 MG chewable tablet Chew 1 tablet (81 mg total) by mouth daily.   atorvastatin 80 MG tablet Commonly known as: LIPITOR Take 1 tablet (80 mg total) by mouth Nightly.   benazepril 10 MG tablet Commonly known as: LOTENSIN Take 0.5 tablets (5 mg total) by mouth daily.   buPROPion 300 MG 24 hr tablet Commonly known as: WELLBUTRIN XL Take 300 mg by mouth daily.   carvedilol 3.125 MG tablet Commonly known as: COREG Take 1 tablet (3.125 mg total) by mouth 2 (two) times daily with a meal.   citalopram 10 MG tablet Commonly known as: CELEXA Take 1 tablet (10 mg total) by mouth daily.   clopidogrel 75 MG tablet Commonly known as: PLAVIX Take 1 tablet (75 mg  total) by mouth daily for 21 days.   divalproex 125 MG capsule Commonly known as: DEPAKOTE SPRINKLE Take 2 capsules (250 mg total) by mouth every 12 (twelve) hours.   folic acid 1 MG tablet Commonly known as: FOLVITE Take 1 mg by mouth daily.   gabapentin 600 MG tablet Commonly known as: NEURONTIN Take 1 tablet (600 mg total) by mouth at bedtime.   hydroxychloroquine 200 MG  tablet Commonly known as: PLAQUENIL Take 200 mg by mouth daily.   hydrOXYzine 10 MG tablet Commonly known as: ATARAX/VISTARIL Take 10 mg by mouth 2 (two) times daily.   Klor-Con M20 20 MEQ tablet Generic drug: potassium chloride SA Take 40 mEq by mouth 2 (two) times daily.   mirtazapine 15 MG tablet Commonly known as: REMERON Take 1 tablet (15 mg total) by mouth at bedtime.   multivitamin with minerals Tabs tablet Take 1 tablet by mouth daily.   Muscle Rub 10-15 % Crea Apply 1 application topically 2 (two) times daily as needed for muscle pain.   nicotine polacrilex 2 MG gum Commonly known as: NICORETTE Take 1 each (2 mg total) by mouth as needed for smoking cessation.   NovoLIN 70/30 (70-30) 100 UNIT/ML injection Generic drug: insulin NPH-regular Human Inject 28 Units into the skin 2 (two) times daily with a meal.   ondansetron 4 MG disintegrating tablet Commonly known as: Zofran ODT Take 1 tablet (4 mg total) by mouth every 8 (eight) hours as needed.   Pancrelipase (Lip-Prot-Amyl) 24000-76000 units Cpep Take 3 capsules by mouth 3 (three) times daily.   rOPINIRole 1 MG tablet Commonly known as: REQUIP Take 1 tablet (1 mg total) by mouth 2 (two) times daily.   traMADol 50 MG tablet Commonly known as: Ultram Take 1 tablet (50 mg total) by mouth every 6 (six) hours as needed.        Follow-up Information     [facility] Follow up in 1 week(s).   Contact information: [place] [address] [phone]                 Signed: Sharen Hones 11/19/2020, 10:43 AM

## 2020-11-19 NOTE — Progress Notes (Signed)
SW informed me of daughter, Bellamie Turney, plan to pick patient up to transfer home today around 1300 and would like for staff to take her outside. SW informed daughter that she could call my cell phone just prior to arriving or once in front of hospital and I would wheel her out to discharge home. At this time, daughter has not contacted me. Called daughter at [phone] and left a detailed voicemail requesting a return call. Will notify SW as well.

## 2020-11-19 NOTE — Care Management CC44 (Signed)
Condition Code 44 Documentation Completed  Patient Details  Name: Brittany [name] MRN: [id number] Date of Birth: Jul 31, 1960   Condition Code 44 given:  Yes Patient signature on Condition Code 44 notice:  Yes Documentation of 2 MD's agreement:  Yes Code 44 added to claim:  Yes    Alberteen Sam, LCSW 11/19/2020, 11:49 AM

## 2020-11-22 LAB — BLOOD GAS, VENOUS
Acid-Base Excess: 7.2 mmol/L — ABNORMAL HIGH (ref 0.0–2.0)
Bicarbonate: 33.6 mmol/L — ABNORMAL HIGH (ref 20.0–28.0)
O2 Saturation: 29.9 %
Patient temperature: 37
pCO2, Ven: 53 mmHg (ref 44.0–60.0)
pH, Ven: 7.41 (ref 7.250–7.430)

## 2020-11-23 LAB — CULTURE, BLOOD (SINGLE)
Culture: NO GROWTH
Special Requests: ADEQUATE

## 2020-12-02 ENCOUNTER — Other Ambulatory Visit: Payer: Self-pay | Admitting: Physical Medicine and Rehabilitation

## 2020-12-05 ENCOUNTER — Other Ambulatory Visit: Payer: Self-pay

## 2020-12-05 ENCOUNTER — Emergency Department: Payer: Medicare Other

## 2020-12-05 ENCOUNTER — Emergency Department
Admission: EM | Admit: 2020-12-05 | Discharge: 2020-12-05 | Disposition: A | Discharge status: Home / Self Care | Payer: Medicare Other | Attending: Emergency Medicine | Admitting: Emergency Medicine

## 2020-12-05 DIAGNOSIS — M79604 Pain in right leg: Secondary | ICD-10-CM | POA: Insufficient documentation

## 2020-12-05 DIAGNOSIS — Z5321 Procedure and treatment not carried out due to patient leaving prior to being seen by health care provider: Secondary | ICD-10-CM | POA: Diagnosis not present

## 2020-12-05 DIAGNOSIS — R4781 Slurred speech: Secondary | ICD-10-CM | POA: Insufficient documentation

## 2020-12-05 DIAGNOSIS — I639 Cerebral infarction, unspecified: Secondary | ICD-10-CM | POA: Insufficient documentation

## 2020-12-05 DIAGNOSIS — R519 Headache, unspecified: Secondary | ICD-10-CM | POA: Insufficient documentation

## 2020-12-05 DIAGNOSIS — R079 Chest pain, unspecified: Secondary | ICD-10-CM | POA: Diagnosis not present

## 2020-12-05 DIAGNOSIS — M79605 Pain in left leg: Secondary | ICD-10-CM | POA: Insufficient documentation

## 2020-12-05 LAB — COMPREHENSIVE METABOLIC PANEL
ALT: 18 U/L (ref 0–44)
AST: 16 U/L (ref 15–41)
Albumin: 3.2 g/dL — ABNORMAL LOW (ref 3.5–5.0)
Alkaline Phosphatase: 94 U/L (ref 38–126)
Anion gap: 15 (ref 5–15)
BUN: 16 mg/dL (ref 6–20)
CO2: 26 mmol/L (ref 22–32)
Calcium: 8.5 mg/dL — ABNORMAL LOW (ref 8.9–10.3)
Chloride: 89 mmol/L — ABNORMAL LOW (ref 98–111)
Creatinine, Ser: 0.91 mg/dL (ref 0.44–1.00)
GFR, Estimated: >60 mL/min (ref >60)
Glucose, Bld: 437 mg/dL — ABNORMAL HIGH (ref 70–99)
Potassium: 3 mmol/L — ABNORMAL LOW (ref 3.5–5.1)
Sodium: 130 mmol/L — ABNORMAL LOW (ref 135–145)
Total Bilirubin: 1.2 mg/dL (ref 0.3–1.2)
Total Protein: 8.5 g/dL — ABNORMAL HIGH (ref 6.5–8.1)

## 2020-12-05 LAB — CBC
HCT: 39.3 % (ref 36.0–46.0)
Hemoglobin: 13.3 g/dL (ref 12.0–15.0)
MCH: 30.5 pg (ref 26.0–34.0)
MCHC: 33.8 g/dL (ref 30.0–36.0)
MCV: 90.1 fL (ref 80.0–100.0)
Platelets: 508 10*3/uL — ABNORMAL HIGH (ref 150–400)
RBC: 4.36 MIL/uL (ref 3.87–5.11)
RDW: 14.1 % (ref 11.5–15.5)
WBC: 7.5 10*3/uL (ref 4.0–10.5)
nRBC: 0 % (ref 0.0–0.2)

## 2020-12-05 LAB — DIFFERENTIAL
Abs Immature Granulocytes: 0.03 10*3/uL (ref 0.00–0.07)
Basophils Absolute: 0 10*3/uL (ref 0.0–0.1)
Basophils Relative: 0 %
Eosinophils Absolute: 0 10*3/uL (ref 0.0–0.5)
Eosinophils Relative: 0 %
Immature Granulocytes: 0 %
Lymphocytes Relative: 42 %
Lymphs Abs: 3.2 10*3/uL (ref 0.7–4.0)
Monocytes Absolute: 0.3 10*3/uL (ref 0.1–1.0)
Monocytes Relative: 4 %
Neutro Abs: 4 10*3/uL (ref 1.7–7.7)
Neutrophils Relative %: 54 %

## 2020-12-05 LAB — PROTIME-INR
INR: 1 (ref 0.8–1.2)
Prothrombin Time: 13.5 seconds (ref 11.4–15.2)

## 2020-12-05 LAB — APTT: aPTT: 27 seconds (ref 24–36)

## 2020-12-05 LAB — TROPONIN I (HIGH SENSITIVITY): Troponin I (High Sensitivity): 17 ng/L (ref <18)

## 2020-12-05 NOTE — ED Notes (Signed)
No answer when called several times from lobby 

## 2020-12-05 NOTE — ED Triage Notes (Signed)
Pt to ED via EMS from home. Pt stating had a stroke 1 month ago and feels like she is having another stroke. Pt stating HA and bilateral leg pain. Pt with slurred speech and states difficulty finding her words since yesterday. Pt endorses CP. Pt tearful in triage.

## 2020-12-05 NOTE — ED Provider Notes (Signed)
Emergency Medicine Provider Triage Evaluation Note  Brittany [name] , a 60 y.o. female  was evaluated in triage.  Pt complains of headache, pains to all 4 extremities, chest and abdomen.  Patient states that she had a stroke a month ago, since then she has been relatively immobile.  Patient states that she will get up to use the restroom or eat but otherwise lays in bed.  Yesterday patient started to have a headache, started to have pain in her chest, abdomen in all 4 extremities.  Patient is concerned that she may have a stroke as she states that she had similar sensations when she had her previous stroke a month ago.  No fevers or chills.  No breathing difficulty.  No nausea, vomiting, diarrhea.  No urinary symptoms  Review of Systems  Positive: Headache, pain to all 4 extremities, chest and abdomen. Negative: Fevers, chills, URI symptoms, cough, shortness of breath, abdominal distention, emesis, diarrhea, constipation, urinary changes  Physical Exam  BP 98/77 (BP Location: Right Arm)   Pulse (!) 117   Temp 98.2 F (36.8 C) (Oral)   Resp 18   Ht 5' (1.524 m)   Wt 46.7 kg   SpO2 94%   BMI 20.11 kg/m  Gen:   Awake, no distress   Resp:  Normal effort  MSK:   Moves extremities without difficulty  Other:  Cranial nerves II to XII grossly intact  Medical Decision Making  Medically screening exam initiated at 4:47 PM.  Appropriate orders placed.  Brittany [name] was informed that the remainder of the evaluation will be completed by another provider, this initial triage assessment does not replace that evaluation, and the importance of remaining in the ED until their evaluation is complete.  Patient presents with headache, pain in all 4 extremities, chest and abdomen.  Patient does have a history of previous stroke a month ago.  She states that symptoms feel similar to when she had a previous stroke.  However no other associated symptoms to include unilateral weakness, visual changes, shortness of  breath, emesis, diarrhea, constipation, urinary changes.  Patient will have labs, CT head, chest x-ray at this time.  Was neurologically intact.  Given the fact that symptoms have started greater than 24 hours ago I will not initiate a code stroke.   Darletta Moll, PA-C 12/05/20 1702    Harvest Dark, MD 12/05/20 [phone]

## 2020-12-12 ENCOUNTER — Emergency Department
Admission: EM | Admit: 2020-12-12 | Discharge: 2020-12-12 | Discharge status: Against Medical Advice | Payer: Medicare Other

## 2020-12-25 ENCOUNTER — Inpatient Hospital Stay
Admission: EM | Admit: 2020-12-25 | Discharge: 2020-12-27 | DRG: 640 | Disposition: A | Discharge status: Home / Self Care | Payer: Medicare Other | Attending: Internal Medicine | Admitting: Internal Medicine

## 2020-12-25 ENCOUNTER — Emergency Department: Payer: Medicare Other

## 2020-12-25 ENCOUNTER — Other Ambulatory Visit: Payer: Self-pay

## 2020-12-25 ENCOUNTER — Encounter: Payer: Self-pay | Admitting: Internal Medicine

## 2020-12-25 DIAGNOSIS — F1290 Cannabis use, unspecified, uncomplicated: Secondary | ICD-10-CM | POA: Diagnosis present

## 2020-12-25 DIAGNOSIS — I10 Essential (primary) hypertension: Secondary | ICD-10-CM

## 2020-12-25 DIAGNOSIS — F1990 Other psychoactive substance use, unspecified, uncomplicated: Secondary | ICD-10-CM

## 2020-12-25 DIAGNOSIS — F1910 Other psychoactive substance abuse, uncomplicated: Secondary | ICD-10-CM | POA: Diagnosis present

## 2020-12-25 DIAGNOSIS — F17200 Nicotine dependence, unspecified, uncomplicated: Secondary | ICD-10-CM | POA: Diagnosis present

## 2020-12-25 DIAGNOSIS — F1490 Cocaine use, unspecified, uncomplicated: Secondary | ICD-10-CM | POA: Diagnosis not present

## 2020-12-25 DIAGNOSIS — I69351 Hemiplegia and hemiparesis following cerebral infarction affecting right dominant side: Secondary | ICD-10-CM | POA: Diagnosis not present

## 2020-12-25 DIAGNOSIS — X31XXXA Exposure to excessive natural cold, initial encounter: Secondary | ICD-10-CM | POA: Diagnosis not present

## 2020-12-25 DIAGNOSIS — G629 Polyneuropathy, unspecified: Secondary | ICD-10-CM | POA: Diagnosis present

## 2020-12-25 DIAGNOSIS — Z20822 Contact with and (suspected) exposure to covid-19: Secondary | ICD-10-CM | POA: Diagnosis present

## 2020-12-25 DIAGNOSIS — Z794 Long term (current) use of insulin: Secondary | ICD-10-CM

## 2020-12-25 DIAGNOSIS — N179 Acute kidney failure, unspecified: Secondary | ICD-10-CM | POA: Diagnosis present

## 2020-12-25 DIAGNOSIS — M329 Systemic lupus erythematosus, unspecified: Secondary | ICD-10-CM | POA: Diagnosis present

## 2020-12-25 DIAGNOSIS — R404 Transient alteration of awareness: Secondary | ICD-10-CM | POA: Diagnosis not present

## 2020-12-25 DIAGNOSIS — R7989 Other specified abnormal findings of blood chemistry: Secondary | ICD-10-CM

## 2020-12-25 DIAGNOSIS — R400 Somnolence: Secondary | ICD-10-CM

## 2020-12-25 DIAGNOSIS — T68XXXA Hypothermia, initial encounter: Secondary | ICD-10-CM | POA: Diagnosis present

## 2020-12-25 DIAGNOSIS — E876 Hypokalemia: Principal | ICD-10-CM | POA: Diagnosis present

## 2020-12-25 DIAGNOSIS — Z79899 Other long term (current) drug therapy: Secondary | ICD-10-CM | POA: Diagnosis not present

## 2020-12-25 DIAGNOSIS — E785 Hyperlipidemia, unspecified: Secondary | ICD-10-CM | POA: Diagnosis present

## 2020-12-25 DIAGNOSIS — G2581 Restless legs syndrome: Secondary | ICD-10-CM

## 2020-12-25 DIAGNOSIS — I998 Other disorder of circulatory system: Secondary | ICD-10-CM | POA: Diagnosis present

## 2020-12-25 DIAGNOSIS — E119 Type 2 diabetes mellitus without complications: Secondary | ICD-10-CM | POA: Diagnosis not present

## 2020-12-25 DIAGNOSIS — R4781 Slurred speech: Secondary | ICD-10-CM | POA: Diagnosis present

## 2020-12-25 DIAGNOSIS — E860 Dehydration: Secondary | ICD-10-CM | POA: Diagnosis present

## 2020-12-25 DIAGNOSIS — Z23 Encounter for immunization: Secondary | ICD-10-CM | POA: Diagnosis present

## 2020-12-25 DIAGNOSIS — F32A Depression, unspecified: Secondary | ICD-10-CM | POA: Diagnosis present

## 2020-12-25 DIAGNOSIS — R4182 Altered mental status, unspecified: Secondary | ICD-10-CM | POA: Diagnosis present

## 2020-12-25 DIAGNOSIS — K5792 Diverticulitis of intestine, part unspecified, without perforation or abscess without bleeding: Secondary | ICD-10-CM

## 2020-12-25 DIAGNOSIS — E872 Acidosis, unspecified: Secondary | ICD-10-CM | POA: Diagnosis present

## 2020-12-25 DIAGNOSIS — Z7982 Long term (current) use of aspirin: Secondary | ICD-10-CM

## 2020-12-25 DIAGNOSIS — I959 Hypotension, unspecified: Secondary | ICD-10-CM | POA: Diagnosis present

## 2020-12-25 DIAGNOSIS — G9341 Metabolic encephalopathy: Secondary | ICD-10-CM | POA: Diagnosis present

## 2020-12-25 DIAGNOSIS — E1169 Type 2 diabetes mellitus with other specified complication: Secondary | ICD-10-CM | POA: Diagnosis present

## 2020-12-25 DIAGNOSIS — Z818 Family history of other mental and behavioral disorders: Secondary | ICD-10-CM

## 2020-12-25 DIAGNOSIS — R262 Difficulty in walking, not elsewhere classified: Secondary | ICD-10-CM | POA: Diagnosis present

## 2020-12-25 DIAGNOSIS — Z888 Allergy status to other drugs, medicaments and biological substances status: Secondary | ICD-10-CM

## 2020-12-25 LAB — COMPREHENSIVE METABOLIC PANEL
ALT: 10 U/L (ref 0–44)
AST: 15 U/L (ref 15–41)
Albumin: 2.9 g/dL — ABNORMAL LOW (ref 3.5–5.0)
Alkaline Phosphatase: 84 U/L (ref 38–126)
Anion gap: 9 (ref 5–15)
BUN: 10 mg/dL (ref 6–20)
CO2: 29 mmol/L (ref 22–32)
Calcium: 8.7 mg/dL — ABNORMAL LOW (ref 8.9–10.3)
Chloride: 97 mmol/L — ABNORMAL LOW (ref 98–111)
Creatinine, Ser: 0.76 mg/dL (ref 0.44–1.00)
GFR, Estimated: >60 mL/min (ref >60)
Glucose, Bld: 210 mg/dL — ABNORMAL HIGH (ref 70–99)
Potassium: 2.4 mmol/L — CL (ref 3.5–5.1)
Sodium: 135 mmol/L (ref 135–145)
Total Bilirubin: 0.6 mg/dL (ref 0.3–1.2)
Total Protein: 7.4 g/dL (ref 6.5–8.1)

## 2020-12-25 LAB — CBC WITH DIFFERENTIAL/PLATELET
Abs Immature Granulocytes: 0.05 10*3/uL (ref 0.00–0.07)
Basophils Absolute: 0.1 10*3/uL (ref 0.0–0.1)
Basophils Relative: 1 %
Eosinophils Absolute: 0 10*3/uL (ref 0.0–0.5)
Eosinophils Relative: 0 %
HCT: 37.8 % (ref 36.0–46.0)
Hemoglobin: 12.7 g/dL (ref 12.0–15.0)
Immature Granulocytes: 1 %
Lymphocytes Relative: 32 %
Lymphs Abs: 3.4 10*3/uL (ref 0.7–4.0)
MCH: 31.7 pg (ref 26.0–34.0)
MCHC: 33.6 g/dL (ref 30.0–36.0)
MCV: 94.3 fL (ref 80.0–100.0)
Monocytes Absolute: 0.6 10*3/uL (ref 0.1–1.0)
Monocytes Relative: 6 %
Neutro Abs: 6.6 10*3/uL (ref 1.7–7.7)
Neutrophils Relative %: 60 %
Platelets: 486 10*3/uL — ABNORMAL HIGH (ref 150–400)
RBC: 4.01 MIL/uL (ref 3.87–5.11)
RDW: 13.4 % (ref 11.5–15.5)
WBC: 10.8 10*3/uL — ABNORMAL HIGH (ref 4.0–10.5)
nRBC: 0 % (ref 0.0–0.2)

## 2020-12-25 LAB — BASIC METABOLIC PANEL
Anion gap: 8 (ref 5–15)
BUN: 7 mg/dL (ref 6–20)
CO2: 26 mmol/L (ref 22–32)
Calcium: 8.5 mg/dL — ABNORMAL LOW (ref 8.9–10.3)
Chloride: 101 mmol/L (ref 98–111)
Creatinine, Ser: 0.94 mg/dL (ref 0.44–1.00)
GFR, Estimated: >60 mL/min (ref >60)
Glucose, Bld: 275 mg/dL — ABNORMAL HIGH (ref 70–99)
Potassium: 3.7 mmol/L (ref 3.5–5.1)
Sodium: 135 mmol/L (ref 135–145)

## 2020-12-25 LAB — RESP PANEL BY RT-PCR (FLU A&B, COVID) ARPGX2
Influenza A by PCR: NEGATIVE
Influenza B by PCR: NEGATIVE
SARS Coronavirus 2 by RT PCR: NEGATIVE

## 2020-12-25 LAB — CK
Total CK: 56 U/L (ref 38–234)
Total CK: 43 U/L (ref 38–234)

## 2020-12-25 LAB — URINALYSIS, COMPLETE (UACMP) WITH MICROSCOPIC
Bacteria, UA: NONE SEEN
Bilirubin Urine: NEGATIVE
Glucose, UA: >=500 mg/dL — AB
Hgb urine dipstick: NEGATIVE
Ketones, ur: NEGATIVE mg/dL
Leukocytes,Ua: NEGATIVE
Nitrite: NEGATIVE
Protein, ur: NEGATIVE mg/dL
Specific Gravity, Urine: 1.015 (ref 1.005–1.030)
pH: 6 (ref 5.0–8.0)

## 2020-12-25 LAB — URINE DRUG SCREEN, QUALITATIVE (ARMC ONLY)
Amphetamines, Ur Screen: NOT DETECTED
Barbiturates, Ur Screen: NOT DETECTED
Benzodiazepine, Ur Scrn: NOT DETECTED
Cannabinoid 50 Ng, Ur ~~LOC~~: NOT DETECTED
Cocaine Metabolite,Ur ~~LOC~~: POSITIVE — AB
MDMA (Ecstasy)Ur Screen: NOT DETECTED
Methadone Scn, Ur: NOT DETECTED
Opiate, Ur Screen: POSITIVE — AB
Phencyclidine (PCP) Ur S: NOT DETECTED
Tricyclic, Ur Screen: NOT DETECTED

## 2020-12-25 LAB — ETHANOL: Alcohol, Ethyl (B): <10 mg/dL (ref <10)

## 2020-12-25 LAB — LACTIC ACID, PLASMA
Lactic Acid, Venous: 2.7 mmol/L (ref 0.5–1.9)
Lactic Acid, Venous: 2 mmol/L (ref 0.5–1.9)
Lactic Acid, Venous: 1.1 mmol/L (ref 0.5–1.9)

## 2020-12-25 LAB — MAGNESIUM: Magnesium: 1.8 mg/dL (ref 1.7–2.4)

## 2020-12-25 LAB — BETA-HYDROXYBUTYRIC ACID: Beta-Hydroxybutyric Acid: 0.11 mmol/L (ref 0.05–0.27)

## 2020-12-25 LAB — GLUCOSE, CAPILLARY
Glucose-Capillary: 323 mg/dL — ABNORMAL HIGH (ref 70–99)
Glucose-Capillary: 362 mg/dL — ABNORMAL HIGH (ref 70–99)
Glucose-Capillary: 398 mg/dL — ABNORMAL HIGH (ref 70–99)

## 2020-12-25 LAB — CBG MONITORING, ED
Glucose-Capillary: 148 mg/dL — ABNORMAL HIGH (ref 70–99)
Glucose-Capillary: 110 mg/dL — ABNORMAL HIGH (ref 70–99)

## 2020-12-25 MED ORDER — GABAPENTIN 600 MG PO TABS
600.0000 mg | ORAL_TABLET | Freq: Every day | ORAL | Status: DC
  Administered 2020-12-25 – 2020-12-26 (×2): 600 mg via ORAL
  Filled 2020-12-25 (×2): qty 1

## 2020-12-25 MED ORDER — LACTATED RINGERS IV BOLUS (SEPSIS)
1000.0000 mL | Freq: Once | INTRAVENOUS | Status: AC
  Administered 2020-12-25: 1000 mL via INTRAVENOUS

## 2020-12-25 MED ORDER — CARVEDILOL 3.125 MG PO TABS
3.1250 mg | ORAL_TABLET | Freq: Two times a day (BID) | ORAL | Status: DC
  Administered 2020-12-25 – 2020-12-27 (×4): 3.125 mg via ORAL
  Filled 2020-12-25 (×4): qty 1

## 2020-12-25 MED ORDER — ENOXAPARIN SODIUM 40 MG/0.4ML IJ SOSY
40.0000 mg | PREFILLED_SYRINGE | INTRAMUSCULAR | Status: DC
  Administered 2020-12-26 – 2020-12-27 (×2): 40 mg via SUBCUTANEOUS
  Filled 2020-12-25 (×2): qty 0.4

## 2020-12-25 MED ORDER — BENAZEPRIL HCL 10 MG PO TABS
10.0000 mg | ORAL_TABLET | Freq: Every day | ORAL | Status: DC
  Administered 2020-12-25 – 2020-12-27 (×3): 10 mg via ORAL
  Filled 2020-12-25 (×4): qty 1

## 2020-12-25 MED ORDER — ACETAMINOPHEN 650 MG RE SUPP: 650.0000 mg | Freq: Four times a day (QID) | RECTAL | Status: DC | PRN

## 2020-12-25 MED ORDER — NICOTINE 14 MG/24HR TD PT24
14.0000 mg | MEDICATED_PATCH | Freq: Every day | TRANSDERMAL | Status: DC
  Filled 2020-12-25: qty 1

## 2020-12-25 MED ORDER — POTASSIUM CHLORIDE IN NACL 20-0.9 MEQ/L-% IV SOLN
INTRAVENOUS | Status: DC
  Filled 2020-12-25: qty 1000

## 2020-12-25 MED ORDER — ADULT MULTIVITAMIN W/MINERALS CH
1.0000 | ORAL_TABLET | Freq: Every day | ORAL | Status: DC
  Administered 2020-12-26 – 2020-12-27 (×2): 1 via ORAL
  Filled 2020-12-25 (×2): qty 1

## 2020-12-25 MED ORDER — ONDANSETRON HCL 4 MG PO TABS: 4.0000 mg | ORAL_TABLET | Freq: Four times a day (QID) | ORAL | Status: DC | PRN

## 2020-12-25 MED ORDER — POTASSIUM CHLORIDE 10 MEQ/100ML IV SOLN
10.0000 meq | INTRAVENOUS | Status: DC
  Administered 2020-12-25 (×3): 10 meq via INTRAVENOUS
  Filled 2020-12-25 (×4): qty 100

## 2020-12-25 MED ORDER — HYDROXYZINE HCL 10 MG PO TABS: 10.0000 mg | ORAL_TABLET | Freq: Two times a day (BID) | ORAL | Status: DC

## 2020-12-25 MED ORDER — ATORVASTATIN CALCIUM 20 MG PO TABS
80.0000 mg | ORAL_TABLET | Freq: Every evening | ORAL | Status: DC
  Administered 2020-12-25 – 2020-12-26 (×2): 80 mg via ORAL
  Filled 2020-12-25 (×2): qty 4

## 2020-12-25 MED ORDER — KCL-LACTATED RINGERS 20 MEQ/L IV SOLN
INTRAVENOUS | Status: DC
  Filled 2020-12-25 (×3): qty 1000

## 2020-12-25 MED ORDER — INSULIN ASPART 100 UNIT/ML IJ SOLN
0.0000 [IU] | Freq: Three times a day (TID) | INTRAMUSCULAR | Status: DC
Start: 2020-12-25 — End: 2020-12-26
  Administered 2020-12-25: 7 [IU] via SUBCUTANEOUS
  Filled 2020-12-25: qty 1

## 2020-12-25 MED ORDER — ACETAMINOPHEN 325 MG PO TABS
650.0000 mg | ORAL_TABLET | Freq: Four times a day (QID) | ORAL | Status: DC | PRN
  Administered 2020-12-27: 650 mg via ORAL
  Filled 2020-12-25: qty 2

## 2020-12-25 MED ORDER — ASPIRIN 81 MG PO CHEW
81.0000 mg | CHEWABLE_TABLET | Freq: Every day | ORAL | Status: DC
Start: 2020-12-25 — End: 2020-12-25

## 2020-12-25 MED ORDER — FOLIC ACID 1 MG PO TABS
1.0000 mg | ORAL_TABLET | Freq: Every day | ORAL | Status: DC
  Administered 2020-12-26 – 2020-12-27 (×2): 1 mg via ORAL
  Filled 2020-12-25 (×2): qty 1

## 2020-12-25 MED ORDER — MIRTAZAPINE 15 MG PO TABS
15.0000 mg | ORAL_TABLET | Freq: Every day | ORAL | Status: DC
  Administered 2020-12-25 – 2020-12-26 (×2): 15 mg via ORAL
  Filled 2020-12-25 (×2): qty 1

## 2020-12-25 MED ORDER — HYDROXYCHLOROQUINE SULFATE 200 MG PO TABS: 200.0000 mg | ORAL_TABLET | Freq: Every day | ORAL | Status: DC

## 2020-12-25 MED ORDER — CITALOPRAM HYDROBROMIDE 20 MG PO TABS: 10.0000 mg | ORAL_TABLET | Freq: Every day | ORAL | Status: DC

## 2020-12-25 MED ORDER — POTASSIUM CHLORIDE 2 MEQ/ML IV SOLN
INTRAVENOUS | Status: DC
  Filled 2020-12-25 (×2): qty 1000

## 2020-12-25 MED ORDER — TRAMADOL HCL 50 MG PO TABS: 50.0000 mg | ORAL_TABLET | Freq: Four times a day (QID) | ORAL | Status: DC | PRN

## 2020-12-25 MED ORDER — DIVALPROEX SODIUM 125 MG PO CSDR: 250.0000 mg | DELAYED_RELEASE_CAPSULE | Freq: Two times a day (BID) | ORAL | Status: DC

## 2020-12-25 MED ORDER — PANCRELIPASE (LIP-PROT-AMYL) 24000-76000 UNITS PO CPEP: 3.0000 | ORAL_CAPSULE | Freq: Three times a day (TID) | ORAL | Status: DC

## 2020-12-25 MED ORDER — INSULIN ASPART 100 UNIT/ML IJ SOLN: 0.0000 [IU] | INTRAMUSCULAR | Status: DC

## 2020-12-25 MED ORDER — ROPINIROLE HCL 1 MG PO TABS
1.0000 mg | ORAL_TABLET | Freq: Two times a day (BID) | ORAL | Status: DC
  Administered 2020-12-25 – 2020-12-27 (×4): 1 mg via ORAL
  Filled 2020-12-25 (×6): qty 1

## 2020-12-25 MED ORDER — BUPROPION HCL ER (XL) 150 MG PO TB24: 300.0000 mg | ORAL_TABLET | Freq: Every day | ORAL | Status: DC

## 2020-12-25 MED ORDER — MAGNESIUM SULFATE 2 GM/50ML IV SOLN
2.0000 g | Freq: Once | INTRAVENOUS | Status: AC
  Administered 2020-12-25: 2 g via INTRAVENOUS
  Filled 2020-12-25: qty 50

## 2020-12-25 MED ORDER — ONDANSETRON HCL 4 MG/2ML IJ SOLN
4.0000 mg | Freq: Four times a day (QID) | INTRAMUSCULAR | Status: DC | PRN
  Administered 2020-12-26: 4 mg via INTRAVENOUS
  Filled 2020-12-25: qty 2

## 2020-12-25 MED ORDER — INFLUENZA VAC SPLIT QUAD 0.5 ML IM SUSY
0.5000 mL | PREFILLED_SYRINGE | INTRAMUSCULAR | Status: AC
  Administered 2020-12-26: 0.5 mL via INTRAMUSCULAR
  Filled 2020-12-25: qty 0.5

## 2020-12-25 MED ORDER — POTASSIUM CHLORIDE CRYS ER 20 MEQ PO TBCR
40.0000 meq | EXTENDED_RELEASE_TABLET | Freq: Two times a day (BID) | ORAL | Status: DC
  Administered 2020-12-25 (×2): 40 meq via ORAL
  Filled 2020-12-25 (×2): qty 2

## 2020-12-25 NOTE — ED Notes (Signed)
Pharmacy called to send more IV K+

## 2020-12-25 NOTE — ED Notes (Signed)
Pt taken to CT and sent for Xray at this time
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
## 2020-12-25 NOTE — ED Notes (Addendum)
Pt arousable with physical stimulation - pt able to answer questions A&Ox4 and reports THC use tonight- denies Etoh/ additional drug use.

## 2020-12-25 NOTE — ED Notes (Signed)
Pt c/o cold food. Dietary called to send hot tray to room

## 2020-12-25 NOTE — ED Notes (Signed)
Bear hugger applied to pt at this time.

## 2020-12-25 NOTE — ED Notes (Signed)
Lab called to add on CK to previous lab work

## 2020-12-25 NOTE — ED Triage Notes (Addendum)
Patient to [facility] via EMS from home.  Per EMS they were called by family after they found patient on porch very lethargic.  EMS interventions -- IV via 22g to left wrist area, hr 68, pulse oxi 95% on room air, cbg 262, initial bp 82/58 on arrival to ED 78/55.   In ED patient responsive to verbal stimuli, but immediately falls back to sleep.  Skin cool to touch.

## 2020-12-25 NOTE — H&P (Signed)
History and Physical    Brittany [name]:[id number] DOB: [birth year] DOA: 12/25/2020  PCP: [facility]   Patient coming from: Home  I have personally briefly reviewed patient's old medical records in [place]  Chief Complaint: Altered mental status  Most of the history was obtained from ER notes as patient is very lethargic.  HPI: Brittany [name] is a 60 y.o. female with medical history significant for CVA with right-sided hemiparesis, diabetes mellitus, hypertension, nicotine dependence, history of SLE, depression who was brought into the ER by EMS for evaluation of altered mental status. Patient's family's reported that he found her sleeping outside.  It is unclear how long she was out there for.  When EMS arrived they found the patient on the porch, very lethargic.  She had a blood pressure 82/58, room air pulse oximetry of 95%, blood sugar of 262 and pulse rate of 68. Patient is lethargic but arouses to verbal stimuli.  She is oriented to person, place and time and is not able to stay awake long enough to provide any history. Labs show sodium 135, potassium 2.4, chloride 97, bicarb 29, glucose 210, BUN 10, creatinine 0.76, calcium 8.7, magnesium 1.8, alkaline phosphatase 84, albumin 2.9, AST 15, ALT 10, total protein 7.4, troponin 17, lactic acid 2.7 >> 2.0, white count 10.8, hemoglobin 12.7, hematocrit 37.8, MCV 94.3, RDW 13.4, platelet count 486, beta hydroxybutyric acid 0.11 Urinalysis is sterile Urine drug screen is positive for cocaine and opiates Respiratory viral panel is negative CT scan of the head without contrast shows no acute findings.  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 Chest x-ray reviewed by me shows no evidence of active cardiopulmonary disease. Twelve-lead EKG reviewed by me shows sinus rhythm.  Right axis deviation.  LVH.   ED Course: Patient is a 60 year old female who presents to  the ER via EMS for evaluation of change in mental status.  Patient was found on the porch by family members lethargic and hypothermic and EMS was called. She was hypotensive upon arrival to the ER and received IV fluids with improvement in her blood pressure She is currently on a Bair hugger and opens eyes to verbal stimuli but is unable to state to provide any history. Labs show significant hypokalemia with a potassium of 2.4 as well as lactic acidosis which shows a downward trend.   Unclear source of sepsis at this time.    Review of Systems: As per HPI otherwise all other systems reviewed and negative.    Past Medical History:  Diagnosis Date   Cancer ([place])    Collagen vascular disease ([place])    Diabetes mellitus without complication ([place])    Hypertension    Lupus ([place])     Past Surgical History:  Procedure Laterality Date   PANCREATECTOMY     spleenectomy       reports that she has been smoking. She has never used smokeless tobacco. She reports current alcohol use. She reports that she does not currently use drugs.  Allergies  Allergen Reactions   Cephalosporins Itching   Hydromorphone Itching   Keflin [Cephalothin] Itching    Family History  Problem Relation Age of Onset   Anxiety disorder Sister    Breast cancer Maternal Aunt       Prior to Admission medications   Medication Sig Start Date End Date Taking? Authorizing Provider  aspirin 81 MG chewable tablet Chew 1 tablet (81 mg  total) by mouth daily. 07/20/19   Sharen Hones, MD  atorvastatin (LIPITOR) 80 MG tablet Take 1 tablet (80 mg total) by mouth Nightly. 11/08/20 12/08/20  Pokhrel, Corrie Mckusick, MD  benazepril (LOTENSIN) 10 MG tablet Take 0.5 tablets (5 mg total) by mouth daily. 08/04/19   Love, Ivan Anchors, PA-C  buPROPion (WELLBUTRIN XL) 300 MG 24 hr tablet Take 300 mg by mouth daily. 08/26/20 08/26/21  [provider]  carvedilol (COREG) 3.125 MG tablet Take 1 tablet (3.125 mg total) by mouth 2 (two) times daily  with a meal. 08/04/19   Love, Ivan Anchors, PA-C  citalopram (CELEXA) 10 MG tablet Take 1 tablet (10 mg total) by mouth daily. 08/05/19   Love, Ivan Anchors, PA-C  divalproex (DEPAKOTE SPRINKLE) 125 MG capsule Take 2 capsules (250 mg total) by mouth every 12 (twelve) hours. 08/04/19   Love, Ivan Anchors, PA-C  folic acid (FOLVITE) 1 MG tablet Take 1 mg by mouth daily. 09/13/17   [provider]  gabapentin (NEURONTIN) 600 MG tablet Take 1 tablet (600 mg total) by mouth at bedtime. 08/04/19   Love, Ivan Anchors, PA-C  hydroxychloroquine (PLAQUENIL) 200 MG tablet Take 200 mg by mouth daily.  05/26/18   [provider]  hydrOXYzine (ATARAX/VISTARIL) 10 MG tablet Take 10 mg by mouth 2 (two) times daily.  12/31/16   [provider]  insulin NPH-regular Human (NOVOLIN 70/30) (70-30) 100 UNIT/ML injection Inject 28 Units into the skin 2 (two) times daily with a meal. 08/04/19   Love, Pamela S, PA-C  KLOR-CON M20 20 MEQ tablet Take 40 mEq by mouth 2 (two) times daily. 10/07/20   [provider]  Menthol-Methyl Salicylate (MUSCLE RUB) 10-15 % CREA Apply 1 application topically 2 (two) times daily as needed for muscle pain. 08/04/19   Love, Ivan Anchors, PA-C  mirtazapine (REMERON) 15 MG tablet Take 1 tablet (15 mg total) by mouth at bedtime. 11/08/20 11/08/21  Pokhrel, Corrie Mckusick, MD  Multiple Vitamin (MULTIVITAMIN WITH MINERALS) TABS tablet Take 1 tablet by mouth daily. 08/05/19   Love, Ivan Anchors, PA-C  nicotine polacrilex (NICORETTE) 2 MG gum Take 1 each (2 mg total) by mouth as needed for smoking cessation. 08/04/19   Love, Ivan Anchors, PA-C  ondansetron (ZOFRAN ODT) 4 MG disintegrating tablet Take 1 tablet (4 mg total) by mouth every 8 (eight) hours as needed. 03/14/20   Rudene Re, MD  Pancrelipase, Lip-Prot-Amyl, 24000-76000 units CPEP Take 3 capsules by mouth 3 (three) times daily. 05/26/18   [provider]  rOPINIRole (REQUIP) 1 MG tablet Take 1 tablet (1 mg total) by mouth 2 (two) times  daily. 08/04/19   Love, Ivan Anchors, PA-C  traMADol (ULTRAM) 50 MG tablet Take 1 tablet (50 mg total) by mouth every 6 (six) hours as needed. 03/14/20 03/14/21  Rudene Re, MD    Physical Exam: Vitals:   12/25/20 0404 12/25/20 0530 12/25/20 0557 12/25/20 0700  BP:  118/81 116/79 (!) 132/91  Pulse:  65 61 68  Resp:  14 17 (!) 22  Temp: 97.6 F (36.4 C) (!) 94.8 F (34.9 C)  97.8 F (36.6 C)  TempSrc: Oral Rectal  Oral  SpO2:  97% 97% 97%  Weight:      Height:         Vitals:   12/25/20 0404 12/25/20 0530 12/25/20 0557 12/25/20 0700  BP:  118/81 116/79 (!) 132/91  Pulse:  65 61 68  Resp:  14 17 (!) 22  Temp: 97.6 F (36.4  C) (!) 94.8 F (34.9 C)  97.8 F (36.6 C)  TempSrc: Oral Rectal  Oral  SpO2:  97% 97% 97%  Weight:      Height:          Constitutional: Lethargic but opens eyes to loud verbal stimuli.  Oriented to person place and time while awake.  Not in any apparent distress.  Thin HEENT:      Head: Normocephalic and atraumatic.         Eyes: PERLA, EOMI, Conjunctivae pallor. Sclera is non-icteric.       Mouth/Throat: Mucous membranes are dry.       Neck: Supple with no signs of meningismus. Cardiovascular: Regular rate and rhythm. No murmurs, gallops, or rubs. 2+ symmetrical distal pulses are present . No JVD. No LE edema Respiratory: Respiratory effort normal .Lungs sounds clear bilaterally. No wheezes, crackles, or rhonchi.  Gastrointestinal: Soft, non tender, and non distended with positive bowel sounds.  Genitourinary: No CVA tenderness. Musculoskeletal: Nontender with normal range of motion in all extremities. No cyanosis, or erythema of extremities. Neurologic: Unable to assess. Skin: Skin is warm, dry.  No rash or ulcers Psychiatric: Unable to assess   Labs on Admission: I have personally reviewed following labs and imaging studies  CBC: Recent Labs  Lab 12/25/20 0407  WBC 10.8*  NEUTROABS 6.6  HGB 12.7  HCT 37.8  MCV 94.3  PLT 486*    Basic Metabolic Panel: Recent Labs  Lab 12/25/20 0407 12/25/20 0444  NA 135  --   K 2.4*  --   CL 97*  --   CO2 29  --   GLUCOSE 210*  --   BUN 10  --   CREATININE 0.76  --   CALCIUM 8.7*  --   MG  --  1.8   GFR: Estimated Creatinine Clearance: 53.6 mL/min (by C-G formula based on SCr of 0.76 mg/dL). Liver Function Tests: Recent Labs  Lab 12/25/20 0407  AST 15  ALT 10  ALKPHOS 84  BILITOT 0.6  PROT 7.4  ALBUMIN 2.9*   No results for input(s): LIPASE, AMYLASE in the last 168 hours. No results for input(s): AMMONIA in the last 168 hours. Coagulation Profile: No results for input(s): INR, PROTIME in the last 168 hours. Cardiac Enzymes: No results for input(s): CKTOTAL, CKMB, CKMBINDEX, TROPONINI in the last 168 hours. BNP (last 3 results) No results for input(s): PROBNP in the last 8760 hours. HbA1C: No results for input(s): HGBA1C in the last 72 hours. CBG: No results for input(s): GLUCAP in the last 168 hours. Lipid Profile: No results for input(s): CHOL, HDL, LDLCALC, TRIG, CHOLHDL, LDLDIRECT in the last 72 hours. Thyroid Function Tests: No results for input(s): TSH, T4TOTAL, FREET4, T3FREE, THYROIDAB in the last 72 hours. Anemia Panel: No results for input(s): VITAMINB12, FOLATE, FERRITIN, TIBC, IRON, RETICCTPCT in the last 72 hours. Urine analysis:    Component Value Date/Time   COLORURINE YELLOW (A) 12/25/2020 0532   APPEARANCEUR CLEAR (A) 12/25/2020 0532   APPEARANCEUR Hazy 04/04/2011 1935   LABSPEC 1.015 12/25/2020 0532   LABSPEC >1.060 04/04/2011 1935   PHURINE 6.0 12/25/2020 0532   GLUCOSEU >=500 (A) 12/25/2020 0532   GLUCOSEU 50 mg/dL 04/04/2011 1935   HGBUR NEGATIVE 12/25/2020 0532   BILIRUBINUR NEGATIVE 12/25/2020 0532   BILIRUBINUR Negative 04/04/2011 1935   KETONESUR NEGATIVE 12/25/2020 0532   PROTEINUR NEGATIVE 12/25/2020 0532   NITRITE NEGATIVE 12/25/2020 0532   LEUKOCYTESUR NEGATIVE 12/25/2020 0532   LEUKOCYTESUR Negative 04/04/2011  1935    Radiological Exams on Admission: CT Head Wo Contrast  Result Date: 12/25/2020 CLINICAL DATA:  Mental status change with unknown cause EXAM: CT HEAD WITHOUT CONTRAST TECHNIQUE: Contiguous axial images were obtained from the base of the skull through the vertex without intravenous contrast. COMPARISON:  Head CT 12/05/2020 FINDINGS: Brain: Remote infarcts in the left cerebellum, left parietooccipital lobe, and right frontal lobe anteriorly. Chronic small vessel ischemia in the hemispheric white matter. No hemorrhage, hydrocephalus, or collection. Vascular: No hyperdense vessel or unexpected calcification. Skull: Normal. Negative for fracture or focal lesion. Sinuses/Orbits: No acute finding. IMPRESSION: 1. No acute finding when compared to 12/05/2020. 2. Chronic infarcts as described. Electronically Signed   By: Jorje Guild M.D.   On: 12/25/2020 05:20   DG Chest Port 1 View  Result Date: 12/25/2020 CLINICAL DATA:  Sepsis EXAM: PORTABLE CHEST 1 VIEW COMPARISON:  11/18/2020 FINDINGS: Lungs are well expanded, symmetric, and clear. No pneumothorax or pleural effusion. Cardiac size within normal limits. Pulmonary vascularity is normal. Osseous structures are age-appropriate. No acute bone abnormality. IMPRESSION: No active disease. Electronically Signed   By: Fidela Salisbury M.D.   On: 12/25/2020 04:20     Assessment/Plan Principal Problem:   AMS (altered mental status) Active Problems:   DMII (diabetes mellitus, type 2) (HCC)   Tobacco dependence   Hypokalemia   Lactic acidosis   Hypothermia      Patient is a 60 year old female who was brought into the ER for evaluation of change in mental status.  She was hypotensive and hypothermic upon arrival to the ER and responded to IV fluid resuscitation.  She is currently on a Retail banker.   Altered mental status Patient remains lethargic but arouses to verbal stimuli.  She is unable to stay awake long enough to provide any  history. Altered mental status may be secondary to substance use/abuse We will keep patient n.p.o. for now Supportive care     Diabetes mellitus Keep patient n.p.o. for now until mental status improves Blood sugar checks every 4 hours Glycemic control with sliding scale insulin    Lactic acidosis Unclear etiology may be secondary to hypoperfusion from hypotension No evidence of an infectious process at this time Lactic acid shows a downward trend We will continue to trend lactic acid levels    Hypotension Hold benazepril and carvedilol for now     Hypokalemia Supplement potassium Check magnesium levels    Nicotine dependence Will place patient on a nicotine transdermal patch 14 mg daily    History of SLE Resume hydroxychloroquine once patient is able to tolerate oral intake    Depression Continue bupropion and Remeron    History of CVA with right-sided hemiparesis Stable Continue aspirin and atorvastatin    Status post pancreatectomy Resume pancreatic enzymes once patient is able to tolerate oral intake   DVT prophylaxis: Lovenox  Code Status: full code  Family Communication: Attempted to reach patient's daughter over the phone.  Left a voicemail awaiting callback. Plan: Back to previous home environment Consults called: none  Status:At the time of admission, it appears that the appropriate admission status for this patient is inpatient. This is judged to be reasonable and necessary to provide the required intensity of service to ensure the patient's safety given the presenting symptoms, physical exam findings, and initial radiographic and laboratory data in the context of their comorbid conditions. Patient requires inpatient status due to high intensity of service, high risk for further deterioration and high frequency of surveillance  required.     Collier Bullock MD [facility]     12/25/2020, 8:29 AM

## 2020-12-26 ENCOUNTER — Inpatient Hospital Stay: Payer: Medicare Other

## 2020-12-26 DIAGNOSIS — G629 Polyneuropathy, unspecified: Secondary | ICD-10-CM

## 2020-12-26 DIAGNOSIS — F1490 Cocaine use, unspecified, uncomplicated: Secondary | ICD-10-CM

## 2020-12-26 DIAGNOSIS — G2581 Restless legs syndrome: Secondary | ICD-10-CM

## 2020-12-26 DIAGNOSIS — I10 Essential (primary) hypertension: Secondary | ICD-10-CM

## 2020-12-26 DIAGNOSIS — E785 Hyperlipidemia, unspecified: Secondary | ICD-10-CM

## 2020-12-26 DIAGNOSIS — K5792 Diverticulitis of intestine, part unspecified, without perforation or abscess without bleeding: Secondary | ICD-10-CM

## 2020-12-26 DIAGNOSIS — E1169 Type 2 diabetes mellitus with other specified complication: Secondary | ICD-10-CM

## 2020-12-26 DIAGNOSIS — G9341 Metabolic encephalopathy: Secondary | ICD-10-CM

## 2020-12-26 DIAGNOSIS — R7989 Other specified abnormal findings of blood chemistry: Secondary | ICD-10-CM

## 2020-12-26 LAB — GLUCOSE, CAPILLARY
Glucose-Capillary: 495 mg/dL — ABNORMAL HIGH (ref 70–99)
Glucose-Capillary: 427 mg/dL — ABNORMAL HIGH (ref 70–99)
Glucose-Capillary: 474 mg/dL — ABNORMAL HIGH (ref 70–99)
Glucose-Capillary: 261 mg/dL — ABNORMAL HIGH (ref 70–99)
Glucose-Capillary: 267 mg/dL — ABNORMAL HIGH (ref 70–99)
Glucose-Capillary: 205 mg/dL — ABNORMAL HIGH (ref 70–99)

## 2020-12-26 LAB — BASIC METABOLIC PANEL
Anion gap: 6 (ref 5–15)
BUN: 7 mg/dL (ref 6–20)
CO2: 28 mmol/L (ref 22–32)
Calcium: 8.2 mg/dL — ABNORMAL LOW (ref 8.9–10.3)
Chloride: 98 mmol/L (ref 98–111)
Creatinine, Ser: 0.66 mg/dL (ref 0.44–1.00)
GFR, Estimated: >60 mL/min (ref >60)
Glucose, Bld: 479 mg/dL — ABNORMAL HIGH (ref 70–99)
Potassium: 3.7 mmol/L (ref 3.5–5.1)
Sodium: 132 mmol/L — ABNORMAL LOW (ref 135–145)

## 2020-12-26 LAB — CBC
HCT: 33.7 % — ABNORMAL LOW (ref 36.0–46.0)
Hemoglobin: 11.5 g/dL — ABNORMAL LOW (ref 12.0–15.0)
MCH: 31.6 pg (ref 26.0–34.0)
MCHC: 34.1 g/dL (ref 30.0–36.0)
MCV: 92.6 fL (ref 80.0–100.0)
Platelets: 512 10*3/uL — ABNORMAL HIGH (ref 150–400)
RBC: 3.64 MIL/uL — ABNORMAL LOW (ref 3.87–5.11)
RDW: 13.9 % (ref 11.5–15.5)
WBC: 12.9 10*3/uL — ABNORMAL HIGH (ref 4.0–10.5)
nRBC: 0.2 % (ref 0.0–0.2)

## 2020-12-26 LAB — URINE CULTURE: Culture: NO GROWTH

## 2020-12-26 MED ORDER — INSULIN ASPART PROT & ASPART (70-30 MIX) 100 UNIT/ML ~~LOC~~ SUSP
25.0000 [IU] | Freq: Two times a day (BID) | SUBCUTANEOUS | Status: DC
  Administered 2020-12-26 – 2020-12-27 (×3): 25 [IU] via SUBCUTANEOUS
  Filled 2020-12-26: qty 10

## 2020-12-26 MED ORDER — INSULIN ASPART 100 UNIT/ML IJ SOLN: 0.0000 [IU] | INTRAMUSCULAR | Status: DC

## 2020-12-26 MED ORDER — INSULIN ASPART 100 UNIT/ML IJ SOLN
0.0000 [IU] | Freq: Three times a day (TID) | INTRAMUSCULAR | Status: DC
  Administered 2020-12-26: 5 [IU] via SUBCUTANEOUS
  Administered 2020-12-26: 9 [IU] via SUBCUTANEOUS
  Administered 2020-12-26: 5 [IU] via SUBCUTANEOUS
  Administered 2020-12-27: 9 [IU] via SUBCUTANEOUS
  Filled 2020-12-26 (×4): qty 1

## 2020-12-26 MED ORDER — ASPIRIN EC 81 MG PO TBEC
81.0000 mg | DELAYED_RELEASE_TABLET | Freq: Every day | ORAL | Status: DC
  Administered 2020-12-26 – 2020-12-27 (×2): 81 mg via ORAL
  Filled 2020-12-26 (×2): qty 1

## 2020-12-26 MED ORDER — GLUCERNA SHAKE PO LIQD
237.0000 mL | Freq: Three times a day (TID) | ORAL | Status: DC
  Administered 2020-12-26 – 2020-12-27 (×4): 237 mL via ORAL

## 2020-12-26 MED ORDER — POTASSIUM CHLORIDE CRYS ER 20 MEQ PO TBCR
20.0000 meq | EXTENDED_RELEASE_TABLET | Freq: Every day | ORAL | Status: DC
  Administered 2020-12-26 – 2020-12-27 (×2): 20 meq via ORAL
  Filled 2020-12-26 (×2): qty 1

## 2020-12-26 MED ORDER — INSULIN ASPART 100 UNIT/ML IJ SOLN
0.0000 [IU] | Freq: Every day | INTRAMUSCULAR | Status: DC
  Administered 2020-12-26: 2 [IU] via SUBCUTANEOUS
  Filled 2020-12-26: qty 1

## 2020-12-26 MED ORDER — GADOBUTROL 1 MMOL/ML IV SOLN
4.0000 mL | Freq: Once | INTRAVENOUS | Status: AC | PRN
  Administered 2020-12-26: 4 mL via INTRAVENOUS

## 2020-12-26 NOTE — Progress Notes (Signed)
Inpatient Diabetes Program Recommendations  AACE/ADA: New Consensus Statement on Inpatient Glycemic Control (2015)  Target Ranges:  Prepandial:   less than 140 mg/dL      Peak postprandial:   less than 180 mg/dL (1-2 hours)      Critically ill patients:  140 - 180 mg/dL   Lab Results  Component Value Date   GLUCAP 427 (H) 12/26/2020   HGBA1C 15.3 (H) 11/06/2020    Review of Glycemic Control Results for Brittany [name], Brittany [name] (MRN [id number]) as of 12/26/2020 09:49  Ref. Range 12/25/2020 08:45 12/25/2020 12:17 12/25/2020 17:44 12/25/2020 20:37 12/25/2020 23:58 12/26/2020 04:35 12/26/2020 07:48  Glucose-Capillary Latest Ref Range: 70 - 99 mg/dL 148 (H) 110 (H) 323 (H) Novolog 7 units 362 (H) 398 (H) 495 (H) 427 (H) Novolog 9 units   Diabetes history: DM2 Outpatient Diabetes medications: 70/30 28 units BID, Metformin 1000 mg BID Current orders for Inpatient glycemic control: 70/30 25 units bid,Novolog 0-9 units tid, 0-5 units hs correction  Inpatient Diabetes Program Recommendations:   Patient's CBGs continue to elevate and currently 427. Did not receive any basal insulin yesterday. 70/30 bid added this am. Patient's A1c was 15.3 and DM coordinator spoke with patient during this admission. Will follow during hospitalization.  Thank you, Nani Gasser. Milcah Dulany, RN, MSN, CDE  Diabetes Coordinator Inpatient Glycemic Control Team Team Pager [phone] (8am-5pm) 12/26/2020 9:54 AM

## 2020-12-26 NOTE — Progress Notes (Signed)
Patient ID: Brittany [name], female   DOB: April 30, 1960, 60 y.o.   MRN: [id number] [facility] PROGRESS NOTE  Brittany [name] PPI:[id number] DOB: 1960/09/08 DOA: 12/25/2020 PCP: [facility]  HPI/Subjective: Patient was brought in for altered mental status and difficulty walking.  This morning she was having trouble with her words and finding the right words to say.  Has history of prior stroke.  Patient ate a good breakfast.  Mental status better than when she came in but not quite back to baseline.  Urine toxicology positive for opiates and cocaine.  Patient states that she does not do any drugs.  Did have a prescription for tramadol previously.  Objective: Vitals:   12/26/20 0437 12/26/20 0747  BP: (!) 169/113 139/88  Pulse: 67 73  Resp: 17 15  Temp: 97.6 F (36.4 C) 98.3 F (36.8 C)  SpO2: 99% 98%    Intake/Output Summary (Last 24 hours) at 12/26/2020 1422 Last data filed at 12/26/2020 1408 Gross per 24 hour  Intake 1206.11 ml  Output --  Net 1206.11 ml   Filed Weights   12/25/20 0328  Weight: 45.4 kg    ROS: Review of Systems  Respiratory:  Negative for shortness of breath.   Cardiovascular:  Negative for chest pain.  Gastrointestinal:  Negative for abdominal pain.  Exam: Physical Exam HENT:     Head: Normocephalic.     Mouth/Throat:     Pharynx: No oropharyngeal exudate.  Cardiovascular:     Rate and Rhythm: Normal rate and regular rhythm.     Heart sounds: Normal heart sounds, S1 normal and S2 normal.  Pulmonary:     Breath sounds: No decreased breath sounds, wheezing, rhonchi or rales.  Abdominal:     Palpations: Abdomen is soft.     Tenderness: There is abdominal tenderness in the left lower quadrant.  Musculoskeletal:     Right lower leg: No swelling.     Left lower leg: No swelling.  Neurological:     Mental Status: She is alert.     Comments: Answers questions slowly and has to think about her words.  Slight slurred speech.  Able to move  all of her extremities to command.      Scheduled Meds:  atorvastatin  80 mg Oral Nightly   benazepril  10 mg Oral Daily   carvedilol  3.125 mg Oral BID WC   enoxaparin (LOVENOX) injection  40 mg Subcutaneous A63K   folic acid  1 mg Oral Daily   gabapentin  600 mg Oral QHS   insulin aspart  0-5 Units Subcutaneous QHS   insulin aspart  0-9 Units Subcutaneous TID WC   insulin aspart protamine- aspart  25 Units Subcutaneous BID WC   mirtazapine  15 mg Oral QHS   multivitamin with minerals  1 tablet Oral Daily   nicotine  14 mg Transdermal Daily   potassium chloride SA  20 mEq Oral Daily   rOPINIRole  1 mg Oral BID    Assessment/Plan:  Acute metabolic encephalopathy.  This has improved from admission but still having some difficulty finding words.  Will get MRI of the brain.  Patient has a prior history of stroke.  We will get PT, OT and speech evaluations. Cocaine and opiates found in the urine toxicology.  The patient and patient's daughter state that she does not use cocaine.  Continue aspirin. Type 2 diabetes mellitus with hyperlipidemia.  Continue atorvastatin.  Restart 70/30 insulin. Recent diverticulitis with abscess.  On  oral Augmentin.  Reviewed CT scans from 12/13/[facility] at [facility].  Patient did have a 2.3 x 1.4 x 2.7 cm gas collection and that was drained.  Patient was discharged on oral Augmentin.  We will continue.  Will need close follow-up as outpatient. Lactic acidosis resolved Restless leg syndrome and neuropathy on gabapentin and Requip Depression on Remeron Essential hypertension on Coreg and benazepril Lupus and collagen vascular disease listed as a medical problem     Code Status:     Code Status Orders  (From admission, onward)           Start     Ordered   12/25/20 0832  Full code  Continuous        12/25/20 0833           Code Status History     Date Active Date Inactive Code Status Order ID Comments User Context   11/18/2020 0633 11/19/2020 2153  Full Code [id number]  Christel Mormon, MD ED   11/04/2020 2150 11/08/2020 1952 Full Code [id number]  Mansy, Arvella Merles, MD ED   07/20/2019 1317 08/04/2019 1640 Full Code [id number]  Bary Leriche, PA-C Inpatient   07/12/2019 2247 07/20/2019 1309 Full Code [id number]  Athena Masse, MD ED   05/28/2019 1811 05/29/2019 1931 Full Code [id number]  Jennye Boroughs, MD ED   10/17/2018 1722 10/19/2018 1601 Full Code [id number]  Lang Snow, NP ED      Family Communication: Spoke with the patient's daughter on the phone Disposition Plan: Status is: Inpatient  Antibiotics: Augmentin  Time spent: 27 minutes  [place]

## 2020-12-26 NOTE — Progress Notes (Signed)
Initial Nutrition Assessment  DOCUMENTATION CODES:   Non-severe (moderate) malnutrition in context of chronic illness  INTERVENTION:   -Glucerna Shake po TID, each supplement provides 220 kcal and 10 grams of protein  -MVI with minerals daily  NUTRITION DIAGNOSIS:   Moderate Malnutrition related to chronic illness (CVA) as evidenced by mild fat depletion, mild muscle depletion, moderate muscle depletion, percent weight loss.  GOAL:   Patient will meet greater than or equal to 90% of their needs  MONITOR:   PO intake, Supplement acceptance, Labs, Weight trends, Skin, I & O's  REASON FOR ASSESSMENT:   Malnutrition Screening Tool    ASSESSMENT:   Brittany [name] is a 60 y.o. female with medical history significant for CVA with right-sided hemiparesis, diabetes mellitus, hypertension, nicotine dependence, history of SLE, depression who was brought into the ER by EMS for evaluation of altered mental status.  Pt admitted with AMS.    Reviewed I/O's: +1.2 L x 24 hours  Case discussed with RN, who reports pt is eating and taking medications well. Pt with hyperglycemia, but improved this shift.   Spoke with pt at bedside, who reports decreased appetite over the past several weeks PTA. She shares that she experienced early satiety and would get full after a couple bites. Since being admitted, her appetite has improved greatly, and she consumed 100% of breakfast. Noted meal completions 100%. Pt is looking forward to her lunch meal.  PTA pt reports consuming 3 meals per day (Breakfast: eggs, toast, and coffee; Lunch: sandwich; Dinner: meat, starch, and vegetable). Pt became very tearful at time of visit, stating she has a lot of social stressors at home. RD provided emotional support and allowed pt to speak about her two grandchildren (ages 19 months and 4 years), whom she helps care for.   Reviewed wt hx; pt has experienced a 7.3% wt loss over the past 2 months, which is significant for  time frame.  Discussed importance of good meal and supplement intake to promote healing.   Medications reviewed and include folic acid and remeron.   Lab Results  Component Value Date   HGBA1C 15.3 (H) 11/06/2020   PTA DM medications are none.   Labs reviewed: CBGS: [id number] (inpatient orders for glycemic control are 0-5 units insulin aspart daily at bedtime and 25 units inuslin aspart protamine-aspart BID).    NUTRITION - FOCUSED PHYSICAL EXAM:  Flowsheet Row Most Recent Value  Orbital Region No depletion  Upper Arm Region Mild depletion  Thoracic and Lumbar Region No depletion  Buccal Region Mild depletion  Temple Region Mild depletion  Clavicle Bone Region Mild depletion  Clavicle and Acromion Bone Region Mild depletion  Scapular Bone Region Mild depletion  Dorsal Hand No depletion  Patellar Region Moderate depletion  Anterior Thigh Region Moderate depletion  Posterior Calf Region Moderate depletion  Edema (RD Assessment) None  Hair Reviewed  Eyes Reviewed  Mouth Reviewed  Skin Reviewed  Nails Reviewed       Diet Order:   Diet Order             Diet Carb Modified Fluid consistency: Thin; Room service appropriate? Yes  Diet effective now                   EDUCATION NEEDS:   Education needs have been addressed  Skin:  Skin Assessment: Reviewed RN Assessment  Last BM:  12/26/20  Height:   Ht Readings from Last 1 Encounters:  12/25/20 5' (1.524 m)  Weight:   Wt Readings from Last 1 Encounters:  12/25/20 45.4 kg    Ideal Body Weight:  45.5 kg  BMI:  Body mass index is 19.53 kg/m.  Estimated Nutritional Needs:   Kcal:  1400-1600  Protein:  75-90 grams  Fluid:  > 1.4 L    Loistine Chance, RD, LDN, [place] Registered Dietitian II Certified Diabetes Care and Education Specialist Please refer to [facility] for RD and/or RD on-call/weekend/after hours pager

## 2020-12-26 NOTE — Progress Notes (Signed)
Patient CBG 427 this am.Pt A&Ox4,able to communicate needs,asymptomatic w/o any changes in condition.MD notified per protocol.no new orders and to continue sliding scale and 25 units of novolog Mix 70/30 Bid.9 units novolog was administered per SSI and will recheck CBG.will cont to monitor.

## 2020-12-26 NOTE — Progress Notes (Signed)
PT Cancellation Note  Patient Details Name: Brittany [name] MRN: [id number] DOB: [birth year]   Cancelled Treatment:    Reason Eval/Treat Not Completed:  (Per chart review, Pt with elevated BG, now at 479, outside of safe range for PT. Will defer evaluation to later date/time.)  3:33 PM, 12/26/20 Etta Grandchild, PT, DPT Physical Therapist - [facility]  [phone] ([place])    [place] C 12/26/2020, 3:32 PM

## 2020-12-26 NOTE — Plan of Care (Signed)
  Problem: Education: Goal: Knowledge of General Education information will improve Description: Including pain rating scale, medication(s)/side effects and non-pharmacologic comfort measures 12/26/2020 1532 by Kerney Elbe, LPN Outcome: Progressing 12/26/2020 1532 by Kerney Elbe, LPN Outcome: Progressing   Problem: Health Behavior/Discharge Planning: Goal: Ability to manage health-related needs will improve 12/26/2020 1532 by Kerney Elbe, LPN Outcome: Progressing 12/26/2020 1532 by Kerney Elbe, LPN Outcome: Progressing   Problem: Clinical Measurements: Goal: Ability to maintain clinical measurements within normal limits will improve 12/26/2020 1532 by Kerney Elbe, LPN Outcome: Progressing 12/26/2020 1532 by Kerney Elbe, LPN Outcome: Progressing Goal: Will remain free from infection 12/26/2020 1532 by Kerney Elbe, LPN Outcome: Progressing 12/26/2020 1532 by Kerney Elbe, LPN Outcome: Progressing Goal: Diagnostic test results will improve 12/26/2020 1532 by Kerney Elbe, LPN Outcome: Progressing 12/26/2020 1532 by Kerney Elbe, LPN Outcome: Progressing Goal: Respiratory complications will improve 12/26/2020 1532 by Kerney Elbe, LPN Outcome: Progressing 12/26/2020 1532 by Kerney Elbe, LPN Outcome: Progressing Goal: Cardiovascular complication will be avoided 12/26/2020 1532 by Kerney Elbe, LPN Outcome: Progressing 12/26/2020 1532 by Kerney Elbe, LPN Outcome: Progressing   Problem: Activity: Goal: Risk for activity intolerance will decrease Outcome: Progressing   Problem: Nutrition: Goal: Adequate nutrition will be maintained Outcome: Progressing   Problem: Coping: Goal: Level of anxiety will decrease Outcome: Progressing   Problem: Elimination: Goal: Will not experience complications related to bowel motility Outcome: Progressing Goal: Will not experience complications  related to urinary retention Outcome: Progressing   Problem: Pain Managment: Goal: General experience of comfort will improve Outcome: Progressing   Problem: Safety: Goal: Ability to remain free from injury will improve Outcome: Progressing   Problem: Skin Integrity: Goal: Risk for impaired skin integrity will decrease Outcome: Progressing

## 2020-12-27 DIAGNOSIS — E872 Acidosis, unspecified: Secondary | ICD-10-CM

## 2020-12-27 DIAGNOSIS — E876 Hypokalemia: Secondary | ICD-10-CM

## 2020-12-27 LAB — CBC
HCT: 32.5 % — ABNORMAL LOW (ref 36.0–46.0)
Hemoglobin: 10.7 g/dL — ABNORMAL LOW (ref 12.0–15.0)
MCH: 30.6 pg (ref 26.0–34.0)
MCHC: 32.9 g/dL (ref 30.0–36.0)
MCV: 92.9 fL (ref 80.0–100.0)
Platelets: 419 10*3/uL — ABNORMAL HIGH (ref 150–400)
RBC: 3.5 MIL/uL — ABNORMAL LOW (ref 3.87–5.11)
RDW: 14.4 % (ref 11.5–15.5)
WBC: 9.8 10*3/uL (ref 4.0–10.5)
nRBC: 0.2 % (ref 0.0–0.2)

## 2020-12-27 LAB — GLUCOSE, CAPILLARY
Glucose-Capillary: 264 mg/dL — ABNORMAL HIGH (ref 70–99)
Glucose-Capillary: 351 mg/dL — ABNORMAL HIGH (ref 70–99)

## 2020-12-27 LAB — BASIC METABOLIC PANEL
Anion gap: 5 (ref 5–15)
BUN: 8 mg/dL (ref 6–20)
CO2: 29 mmol/L (ref 22–32)
Calcium: 8.5 mg/dL — ABNORMAL LOW (ref 8.9–10.3)
Chloride: 100 mmol/L (ref 98–111)
Creatinine, Ser: 0.56 mg/dL (ref 0.44–1.00)
GFR, Estimated: >60 mL/min (ref >60)
Glucose, Bld: 381 mg/dL — ABNORMAL HIGH (ref 70–99)
Potassium: 3.7 mmol/L (ref 3.5–5.1)
Sodium: 134 mmol/L — ABNORMAL LOW (ref 135–145)

## 2020-12-27 MED ORDER — INSULIN ASPART PROT & ASPART (70-30 MIX) 100 UNIT/ML ~~LOC~~ SUSP
28.0000 [IU] | Freq: Two times a day (BID) | SUBCUTANEOUS | Status: DC
  Filled 2020-12-27: qty 10

## 2020-12-27 MED ORDER — CITALOPRAM HYDROBROMIDE 10 MG PO TABS: 10.0000 mg | ORAL_TABLET | Freq: Every day | ORAL | 0 refills | Status: DC

## 2020-12-27 MED ORDER — PANCRELIPASE (LIP-PROT-AMYL) 24000-76000 UNITS PO CPEP: 3.0000 | ORAL_CAPSULE | Freq: Three times a day (TID) | ORAL | 0 refills | Status: DC

## 2020-12-27 MED ORDER — FOLIC ACID 1 MG PO TABS: 1.0000 mg | ORAL_TABLET | Freq: Every day | ORAL | 0 refills | Status: DC

## 2020-12-27 MED ORDER — ONDANSETRON 4 MG PO TBDP: 4.0000 mg | ORAL_TABLET | Freq: Three times a day (TID) | ORAL | 0 refills | Status: DC | PRN

## 2020-12-27 MED ORDER — GLUCERNA SHAKE PO LIQD: 237.0000 mL | Freq: Three times a day (TID) | ORAL | 0 refills | Status: DC

## 2020-12-27 NOTE — Evaluation (Signed)
Physical Therapy Evaluation Patient Details Name: Brittany [name] MRN: [id number] DOB: 03/30/1960 Today's Date: 12/27/2020  History of Present Illness  Brittany [name] is a 58yoF who comes to [facility] on 11/13 found lethargic on porch by family. Pt hypotensive, hyperglycemic, hypothermic upon arrival, UDS (+)x2. RVP (-). CT scan of the head without contrast shows no acute findings.  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 PMH: CVA c Rt hemi weakness, DM, HTN, nictoine dependence, SLE, LOC,  Clinical Impression  Pt admitted with above diagnosis. Pt currently with functional limitations due to the deficits listed below (see "PT Problem List"). Upon entry, pt in bed, awake and agreeable to participate. Noted priot BG in 360s, hence mobility is kept to a minimum, but eval takes place due to pending DC. Pt performs all mobility at minguard assist or better, is weaker and less steady than baseline. Pt able to demonstrate ability to perform entry steps for home. Patient's performance this date reveals decreased ability, independence, and tolerance in performing all basic mobility required for performance of activities of daily living. Pt requires additional DME, close physical assistance, and cues for safe participate in mobility. Pt will benefit from skilled PT intervention to increase independence and safety with basic mobility in preparation for discharge to the venue listed below.       Recommendations for follow up therapy are one component of a multi-disciplinary discharge planning process, led by the attending physician.  Recommendations may be updated based on patient status, additional functional criteria and insurance authorization.  Follow Up Recommendations Home health PT    Assistance Recommended at Discharge Intermittent Supervision/Assistance  Functional Status Assessment Patient has had a recent decline in their functional status and  demonstrates the ability to make significant improvements in function in a reasonable and predictable amount of time.  Equipment Recommendations  None recommended by PT    Recommendations for Other Services       Precautions / Restrictions Precautions Precautions: Fall      Mobility  Bed Mobility Overal bed mobility: Independent                  Transfers Overall transfer level: Needs assistance Equipment used: None Transfers: Sit to/from Stand Sit to Stand: Supervision                Ambulation/Gait Ambulation/Gait assistance: Min guard Gait Distance (Feet): 80 Feet Assistive device: None Gait Pattern/deviations: WFL(Within Functional Limits);Staggering left;Staggering right;Decreased dorsiflexion - right;Decreased dorsiflexion - left       General Gait Details: limited distance due to elevated BG  Stairs Stairs: Yes Stairs assistance: Min guard Stair Management: One rail Right Number of Stairs: 6 General stair comments: legs feel weak  Wheelchair Mobility    Modified Rankin (Stroke Patients Only)       Balance Overall balance assessment: Mild deficits observed, not formally tested;History of Falls           Standing balance-Leahy Scale: Good                               Pertinent Vitals/Pain      Home Living Family/patient expects to be discharged to:: Private residence Living Arrangements: Children;Other relatives Available Help at Discharge: Family;Available PRN/intermittently Type of Home: House Home Access: Stairs to enter Entrance Stairs-Rails: Psychiatric nurse of Steps: 2   Home Layout: One level Home Equipment: Cane - single  point;Shower seat;Grab bars - tub/shower;Hand held shower head      Prior Function Prior Level of Function : Independent/Modified Independent             Mobility Comments: Pt reports being MOD-I for functional mobility with SPC ADLs Comments: Pt reports being  independent with ADLs     Hand Dominance        Extremity/Trunk Assessment                Communication      Cognition Arousal/Alertness: Awake/alert Behavior During Therapy: WFL for tasks assessed/performed Overall Cognitive Status: History of cognitive impairments - at baseline                                 General Comments: Pt alert and oriented to self, place, date, and some aspects of situation. pt endorses "thinking differently than usual", however [facility] for familiar ADLs        General Comments      Exercises     Assessment/Plan    PT Assessment Patient needs continued PT services  PT Problem List Decreased strength;Decreased cognition;Decreased range of motion;Decreased activity tolerance;Decreased balance;Decreased mobility;Decreased knowledge of precautions;Decreased safety awareness       PT Treatment Interventions DME instruction;Balance training;Gait training;Stair training;Functional mobility training;Therapeutic activities;Therapeutic exercise;Patient/family education    PT Goals (Current goals can be found in the Care Plan section)  Acute Rehab PT Goals Patient Stated Goal: regain PLOF PT Goal Formulation: With patient Time For Goal Achievement: 01/10/21 Potential to Achieve Goals: Fair    Frequency Min 2X/week   Barriers to discharge        Co-evaluation               AM-PAC PT "6 Clicks" Mobility  Outcome Measure Help needed turning from your back to your side while in a flat bed without using bedrails?: None Help needed moving from lying on your back to sitting on the side of a flat bed without using bedrails?: None Help needed moving to and from a bed to a chair (including a wheelchair)?: A Little Help needed standing up from a chair using your arms (e.g., wheelchair or bedside chair)?: A Little Help needed to walk in hospital room?: A Little Help needed climbing 3-5 steps with a railing? : A Little 6 Click  Score: 20    End of Session Equipment Utilized During Treatment: Gait belt Activity Tolerance: Patient tolerated treatment well;Patient limited by fatigue Patient left: in bed;with call bell/phone within reach Nurse Communication: Mobility status PT Visit Diagnosis: Unsteadiness on feet (R26.81);Difficulty in walking, not elsewhere classified (R26.2);Muscle weakness (generalized) (M62.81);Other abnormalities of gait and mobility (R26.89)    Time: 1540-0867 PT Time Calculation (min) (ACUTE ONLY): 14 min   Charges:   PT Evaluation $PT Eval Moderate Complexity: 1 Mod        3:42 PM, 12/27/20 Etta Grandchild, PT, DPT Physical Therapist - [facility]  [phone] ([place])    [place] [facility] 12/27/2020, 3:36 PM

## 2020-12-27 NOTE — Evaluation (Signed)
Occupational Therapy Evaluation Patient Details Name: Brittany [name] MRN: [id number] DOB: 14-Aug-1960 Today's Date: 12/27/2020   History of Present Illness Brittany [name] is a 60yo F who comes to [facility] on 11/13 found lethargic on porch by family. Pt hypotensive, hyperglycemic, hypothermic upon arrival, UDS (+)x2. RVP (-). CT scan of the head without contrast shows no acute findings.  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 PMH: CVA c Rt hemi weakness, DM, HTN, nictoine dependence, SLE, LOC,   Clinical Impression   Pt seen for OT evaluation this date. Pt alert and oriented to self, place, date, and some aspects of situation, however endorsed "thinking differently than usual". Pt reports that prior to admission she was MOD-I with [facility] for functional mobility and ADLs, living in a 1-story home with daughter and grandchildren. Pt currently requires SUPERVISION for functional mobility of short household distances without AD, SUPERVISION for standing grooming tasks, and SUPERVISION for toilet transfer/hygiene due to current functional impairments (See OT Problem List below). Pt would benefit from additional skilled OT services to maximize return to PLOF and minimize risk of future falls, injury, caregiver burden, and readmission. Upon discharge, recommend no OT follow up.        Recommendations for follow up therapy are one component of a multi-disciplinary discharge planning process, led by the attending physician.  Recommendations may be updated based on patient status, additional functional criteria and insurance authorization.   Follow Up Recommendations  No OT follow up    Assistance Recommended at Discharge PRN  Functional Status Assessment  Patient has had a recent decline in their functional status and demonstrates the ability to make significant improvements in function in a reasonable and predictable amount of time.  Equipment  Recommendations  None recommended by OT       Precautions / Restrictions Precautions Precautions: Fall Restrictions Weight Bearing Restrictions: No      Mobility Bed Mobility Overal bed mobility: Modified Independent             General bed mobility comments: Able to perform with ease    Transfers Overall transfer level: Needs assistance Equipment used: None Transfers: Sit to/from Stand Sit to Stand: Supervision                  Balance Overall balance assessment: Needs assistance Sitting-balance support: No upper extremity supported;Feet supported Sitting balance-Leahy Scale: Good Sitting balance - Comments: Good sitting balance reaching outside BOS for LB dressing   Standing balance support: No upper extremity supported;During functional activity Standing balance-Leahy Scale: Good Standing balance comment: able to complete x3 standing grooming tasks with no LOB observed                           ADL either performed or assessed with clinical judgement   ADL Overall ADL's : Needs assistance/impaired     Grooming: Wash/dry hands;Wash/dry face;Oral care;Supervision/safety;Standing               Lower Body Dressing: Supervision/safety;Set up;Sit to/from stand Lower Body Dressing Details (indicate cue type and reason): to don/doff underwear Toilet Transfer: Supervision/safety;Ambulation;Regular Toilet;Grab bars   Toileting- Clothing Manipulation and Hygiene: Supervision/safety;Set up;Sit to/from stand       Functional mobility during ADLs: Supervision/safety       Vision Ability to See in Adequate Light: 1 Impaired Patient Visual Report: Blurring of vision Additional Comments: Pt reporting blurriness, but unable to recall when  symptoms started.            Pertinent Vitals/Pain Pain Assessment: Faces Faces Pain Scale: Hurts a little bit Pain Location: head Pain Descriptors / Indicators: Aching Pain Intervention(s): Limited  activity within patient's tolerance;Monitored during session     Hand Dominance Right   Extremity/Trunk Assessment Upper Extremity Assessment Upper Extremity Assessment: RUE deficits/detail;LUE deficits/detail RUE Deficits / Details: Grossly 4-/5 in all movemetns. RUE Sensation: WNL LUE Deficits / Details: Grossly 4/5 in all movements LUE Sensation: WNL   Lower Extremity Assessment Lower Extremity Assessment: Overall WFL for tasks assessed (strength WFL, however pt reporting sensation R LE < L LE)   Cervical / Trunk Assessment Cervical / Trunk Assessment: Normal   Communication Communication Communication: Expressive difficulties (some word finding difficulties)   Cognition Arousal/Alertness: Awake/alert Behavior During Therapy: WFL for tasks assessed/performed Overall Cognitive Status: Impaired/Different from baseline                                 General Comments: Pt alert and oriented to self, place, date, and some aspects of situation. pt endorses "thinking differently than usual", however [facility] for familiar ADLs                Home Living Family/patient expects to be discharged to:: Private residence Living Arrangements: Children;Other relatives (daughter and grand children) Available Help at Discharge: Family;Available PRN/intermittently Type of Home: House Home Access: Stairs to enter [employer] of Steps: 2 Entrance Stairs-Rails: Right;Left (has 2 but can only reach one) Home Layout: One level     Bathroom Shower/Tub: Walk-in shower         Home Equipment: Kasandra Knudsen - single point;Shower seat;Grab bars - tub/shower;Hand held shower head          Prior Functioning/Environment Prior Level of Function : Independent/Modified Independent             Mobility Comments: Pt reports being MOD-I for functional mobility with SPC ADLs Comments: Pt reports being independent with ADLs        OT Problem List: Decreased activity  tolerance;Impaired balance (sitting and/or standing);Decreased cognition;Impaired sensation      OT Treatment/Interventions: Self-care/ADL training;Therapeutic exercise;Energy conservation;DME and/or AE instruction;Therapeutic activities;Visual/perceptual remediation/compensation;Patient/family education;Balance training    OT Goals(Current goals can be found in the care plan section) Acute Rehab OT Goals Patient Stated Goal: to return home OT Goal Formulation: With patient Time For Goal Achievement: 01/10/21 Potential to Achieve Goals: Good ADL Goals Pt Will Perform Grooming: with modified independence;standing Pt Will Perform Lower Body Bathing: with modified independence;sit to/from stand Pt Will Transfer to Toilet: with modified independence;ambulating;regular height toilet  OT Frequency: Min 2X/week    AM-PAC OT "6 Clicks" Daily Activity     Outcome Measure Help from another person eating meals?: None Help from another person taking care of personal grooming?: A Little Help from another person toileting, which includes using toliet, bedpan, or urinal?: A Little Help from another person bathing (including washing, rinsing, drying)?: A Little Help from another person to put on and taking off regular upper body clothing?: None Help from another person to put on and taking off regular lower body clothing?: A Little 6 Click Score: 20   End of Session Nurse Communication: Mobility status  Activity Tolerance: Patient tolerated treatment well Patient left: in chair;with call bell/phone within reach;with chair alarm set  OT Visit Diagnosis: Unsteadiness on feet (R26.81)  Time: 3606-7703 OT Time Calculation (min): 23 min Charges:  OT General Charges $OT Visit: 1 Visit OT Evaluation $OT Eval Moderate Complexity: 1 Mod OT Treatments $Self Care/Home Management : 8-22 mins  Fredirick Maudlin, OTR/L [place]

## 2020-12-27 NOTE — Discharge Summary (Signed)
[facility] [place] NAME: Brittany [name]    MR#:  [id number]  DATE OF BIRTH:  01-04-1961  DATE OF ADMISSION:  12/25/2020 ADMITTING PHYSICIAN: Athena Masse, MD  DATE OF DISCHARGE: 12/27/2020  2:15 PM  PRIMARY CARE PHYSICIAN: Clinic, [facility]    ADMISSION DIAGNOSIS:  Hypokalemia [E87.6] Somnolence [R40.0] Hypothermia [T68.XXXA] Elevated lactic acid level [R79.89] Hypothermia, initial encounter [T68.XXXA] Altered mental status, unspecified altered mental status type [R41.82] Polysubstance use disorder [F19.90]  DISCHARGE DIAGNOSIS:  Principal Problem:   AMS (altered mental status) Active Problems:   Type 2 diabetes mellitus with hyperlipidemia (HCC)   Essential hypertension   Restless leg syndrome   Tobacco dependence   Hypokalemia   Lactic acidosis   Hypothermia   Acute metabolic encephalopathy   Diverticulitis   Elevated lactic acid level   Neuropathy   Cocaine use   SECONDARY DIAGNOSIS:   Past Medical History:  Diagnosis Date   Cancer ([place])    Collagen vascular disease ([place])    Diabetes mellitus without complication ([place])    Hypertension    Lupus ([place])     HOSPITAL COURSE:   1.  Acute metabolic encephalopathy this has resolved.  Since the patient was having word finding difficulties and difficulty speaking yesterday I ordered an MRI of the brain.  The MRI of the brain does not show any acute event.  MRI did show prior stroke.  Mental status back to baseline today. 2.  Type 2 diabetes mellitus with hyperlipidemia.  Continue atorvastatin.  Continue 70/30 insulin 28 units twice daily.  Sugars have been in the 200s and 300s while here. 3.  Recent diverticulitis with abscess.  Patient can go back on her prior antibiotics to completion.  White blood cell count normal.  No fever.  No abdominal pain.  In reviewing CT scan from 12/13/2020 at [facility] the patient did have a 2.3 x 1.4 x 2.7 cm gas collection that was drained.   Will need close follow-up as outpatient. 4.  Lactic acidosis has resolved 5.  Restless leg syndrome and neuropathy on gabapentin and Requip 6.  Depression on Remeron 7.  Essential hypertension.  Patient's daughter states that they have stopped Coreg and benazepril. 8.  Lupus and collagen vascular disease listed as a medical problem.  Patient's daughter does not think that she has been taking the Plaquenil at this point.  Follow-up with PMD to decide to restart. 9.  Hypokalemia.  Potassium very low on admission at 2.4.  Potassium replaced to 3.7.  Since the patient is eating she will not need replacement upon discharge. 10.  Acute kidney injury.  Creatinine 0.94 on 12/25/2020 and down to 0.56 upon discharge.  Likely dehydration playing a role.  Of note, urine toxicology positive for cocaine and opiates but negative for marijuana.  The patient was smoking marijuana prior to coming into the hospital.  Denies any other drugs.  The patient's outpatient tramadol may pop up as a opiate on a urine toxicology.  I am unable to explain why her urine toxicology was negative for marijuana and positive for cocaine.  Both the patient and her daughter state that she does not do cocaine.  DISCHARGE CONDITIONS:   Satisfactory  CONSULTS OBTAINED:  None  DRUG ALLERGIES:   Allergies  Allergen Reactions   Cephalosporins Itching   Hydromorphone Itching   Keflin [Cephalothin] Itching    DISCHARGE MEDICATIONS:   Allergies as of 12/27/2020  Reactions   Cephalosporins Itching   Hydromorphone Itching   Keflin [cephalothin] Itching        Medication List     STOP taking these medications    benazepril 10 MG tablet Commonly known as: LOTENSIN   buPROPion 300 MG 24 hr tablet Commonly known as: WELLBUTRIN XL   carvedilol 3.125 MG tablet Commonly known as: COREG   divalproex 125 MG capsule Commonly known as: DEPAKOTE SPRINKLE   hydroxychloroquine 200 MG tablet Commonly known as:  PLAQUENIL   Klor-Con M20 20 MEQ tablet Generic drug: potassium chloride SA   nicotine polacrilex 2 MG gum Commonly known as: NICORETTE   traMADol 50 MG tablet Commonly known as: Ultram       TAKE these medications    aspirin 81 MG chewable tablet Chew 1 tablet (81 mg total) by mouth daily.   atorvastatin 80 MG tablet Commonly known as: LIPITOR Take 1 tablet (80 mg total) by mouth Nightly.   citalopram 10 MG tablet Commonly known as: CELEXA Take 1 tablet (10 mg total) by mouth daily.   feeding supplement (GLUCERNA SHAKE) Liqd Take 237 mLs by mouth 3 (three) times daily between meals.   fluconazole 200 MG tablet Commonly known as: DIFLUCAN Take 400 mg by mouth daily.   folic acid 1 MG tablet Commonly known as: FOLVITE Take 1 tablet (1 mg total) by mouth daily.   gabapentin 600 MG tablet Commonly known as: NEURONTIN Take 1 tablet (600 mg total) by mouth at bedtime.   hydrOXYzine 10 MG tablet Commonly known as: ATARAX/VISTARIL Take 10 mg by mouth 2 (two) times daily.   levofloxacin 750 MG tablet Commonly known as: LEVAQUIN Take 750 mg by mouth daily.   metroNIDAZOLE 500 MG tablet Commonly known as: FLAGYL Take 500 mg by mouth every 12 (twelve) hours.   mirtazapine 15 MG tablet Commonly known as: REMERON Take 1 tablet (15 mg total) by mouth at bedtime.   multivitamin with minerals Tabs tablet Take 1 tablet by mouth daily.   Muscle Rub 10-15 % Crea Apply 1 application topically 2 (two) times daily as needed for muscle pain.   NovoLIN 70/30 (70-30) 100 UNIT/ML injection Generic drug: insulin NPH-regular Human Inject 28 Units into the skin 2 (two) times daily with a meal.   ondansetron 4 MG disintegrating tablet Commonly known as: Zofran ODT Take 1 tablet (4 mg total) by mouth every 8 (eight) hours as needed.   Pancrelipase (Lip-Prot-Amyl) 24000-76000 units Cpep Take 3 capsules (72,000 Units total) by mouth 3 (three) times daily.   rOPINIRole 1 MG  tablet Commonly known as: REQUIP Take 1 tablet (1 mg total) by mouth 2 (two) times daily.         DISCHARGE INSTRUCTIONS:   Follow-up PMD 5 days  If you experience worsening of your admission symptoms, develop shortness of breath, life threatening emergency, suicidal or homicidal thoughts you must seek medical attention immediately by calling 911 or calling your MD immediately  if symptoms less severe.  You Must read complete instructions/literature along with all the possible adverse reactions/side effects for all the Medicines you take and that have been prescribed to you. Take any new Medicines after you have completely understood and accept all the possible adverse reactions/side effects.   Please note  You were cared for by a hospitalist during your hospital stay. If you have any questions about your discharge medications or the care you received while you were in the hospital after you are discharged, you can  call the unit and asked to speak with the hospitalist on call if the hospitalist that took care of you is not available. Once you are discharged, your primary care physician will handle any further medical issues. Please note that NO REFILLS for any discharge medications will be authorized once you are discharged, as it is imperative that you return to your primary care physician (or establish a relationship with a primary care physician if you do not have one) for your aftercare needs so that they can reassess your need for medications and monitor your lab values.    Today   CHIEF COMPLAINT:   Chief Complaint  Patient presents with   Fatigue    HISTORY OF PRESENT ILLNESS:  Brittany [name]  is a 60 y.o. female brought in with altered mental status and difficulty walking.   VITAL SIGNS:  Blood pressure 136/86, pulse 71, temperature 97.6 F (36.4 C), resp. rate 16, height 5' (1.524 m), weight 45.4 kg, SpO2 98 %.  I/O:   Intake/Output Summary (Last 24 hours) at  12/27/2020 1552 Last data filed at 12/27/2020 1033 Gross per 24 hour  Intake 0 ml  Output 0 ml  Net 0 ml    PHYSICAL EXAMINATION:  GENERAL:  60 y.o.-year-old patient lying in the bed with no acute distress.  EYES: Pupils equal, round, reactive to light and accommodation. No scleral icterus.  HEENT: Head atraumatic, normocephalic. Oropharynx and nasopharynx clear.  LUNGS: Normal breath sounds bilaterally, no wheezing, rales,rhonchi or crepitation. No use of accessory muscles of respiration.  CARDIOVASCULAR: S1, S2 normal. No murmurs, rubs, or gallops.  ABDOMEN: Soft, non-tender, non-distended.  EXTREMITIES: No pedal edema.  NEUROLOGIC: Cranial nerves II through XII are intact. Muscle strength 5/5 in all extremities. Sensation intact. Gait not checked.  PSYCHIATRIC: The patient is alert and oriented x 3.  SKIN: No obvious rash, lesion, or ulcer.   DATA REVIEW:   CBC Recent Labs  Lab 12/27/20 0549  WBC 9.8  HGB 10.7*  HCT 32.5*  PLT 419*    Chemistries  Recent Labs  Lab 12/25/20 0407 12/25/20 0444 12/25/20 1754 12/27/20 0549  NA 135  --    < > 134*  K 2.4*  --    < > 3.7  CL 97*  --    < > 100  CO2 29  --    < > 29  GLUCOSE 210*  --    < > 381*  BUN 10  --    < > 8  CREATININE 0.76  --    < > 0.56  CALCIUM 8.7*  --    < > 8.5*  MG  --  1.8  --   --   AST 15  --   --   --   ALT 10  --   --   --   ALKPHOS 84  --   --   --   BILITOT 0.6  --   --   --    < > = values in this interval not displayed.     Microbiology Results  Results for orders placed or performed during the hospital encounter of 12/25/20  Resp Panel by RT-PCR (Flu A&B, Covid) Nasopharyngeal Swab     Status: None   Collection Time: 12/25/20  4:31 AM   Specimen: Nasopharyngeal Swab; Nasopharyngeal(NP) swabs in vial transport medium  Result Value Ref Range Status   SARS Coronavirus 2 by RT PCR NEGATIVE NEGATIVE Final    Comment: (NOTE) SARS-CoV-2  target nucleic acids are NOT DETECTED.  The  SARS-CoV-2 RNA is generally detectable in upper respiratory specimens during the acute phase of infection. The lowest concentration of SARS-CoV-2 viral copies this assay can detect is 138 copies/mL. A negative result does not preclude SARS-Cov-2 infection and should not be used as the sole basis for treatment or other patient management decisions. A negative result may occur with  improper specimen collection/handling, submission of specimen other than nasopharyngeal swab, presence of viral mutation(s) within the areas targeted by this assay, and inadequate number of viral copies(<138 copies/mL). A negative result must be combined with clinical observations, patient history, and epidemiological information. The expected result is Negative.  Fact Sheet for Patients:  EntrepreneurPulse.com.au  Fact Sheet for Healthcare Providers:  IncredibleEmployment.be  This test is no t yet approved or cleared by the [place] FDA and  has been authorized for detection and/or diagnosis of SARS-CoV-2 by FDA under an Emergency Use Authorization (EUA). This EUA will remain  in effect (meaning this test can be used) for the duration of the COVID-19 declaration under Section 564(b)(1) of the Act, 21 U.S.C.section 360bbb-3(b)(1), unless the authorization is terminated  or revoked sooner.       Influenza A by PCR NEGATIVE NEGATIVE Final   Influenza B by PCR NEGATIVE NEGATIVE Final    Comment: (NOTE) The Xpert Xpress SARS-CoV-2/FLU/RSV plus assay is intended as an aid in the diagnosis of influenza from Nasopharyngeal swab specimens and should not be used as a sole basis for treatment. Nasal washings and aspirates are unacceptable for Xpert Xpress SARS-CoV-2/FLU/RSV testing.  Fact Sheet for Patients: EntrepreneurPulse.com.au  Fact Sheet for Healthcare Providers: IncredibleEmployment.be  This test is not yet approved or  cleared by the [place] FDA and has been authorized for detection and/or diagnosis of SARS-CoV-2 by FDA under an Emergency Use Authorization (EUA). This EUA will remain in effect (meaning this test can be used) for the duration of the COVID-19 declaration under Section 564(b)(1) of the Act, 21 U.S.C. section 360bbb-3(b)(1), unless the authorization is terminated or revoked.  Performed at [facility], [address] [place], [place] [address]   Urine Culture     Status: None   Collection Time: 12/25/20  5:32 AM   Specimen: In/Out Cath Urine  Result Value Ref Range Status   Specimen Description   Final    IN/OUT CATH URINE Performed at [facility], [address], [place], [place] [address]    Special Requests   Final    NONE Performed at [facility], [address] [place], [place] [address]    Culture   Final    NO GROWTH Performed at [facility], [place] [address] [place], [place] [address]    Report Status 12/26/2020 FINAL  Final    RADIOLOGY:  MR BRAIN W WO CONTRAST  Result Date: 12/27/2020 CLINICAL DATA:  Initial evaluation for altered mental status, difficulty walking. EXAM: MRI HEAD WITHOUT AND WITH CONTRAST TECHNIQUE: Multiplanar, multiecho pulse sequences of the brain and surrounding structures were obtained without and with intravenous contrast. CONTRAST:  66mL GADAVIST GADOBUTROL 1 MMOL/ML IV SOLN COMPARISON:  Prior CT from 12/25/2020 as well as previous MRI from 11/05/2020. FINDINGS: Brain: Generalized age-related cerebral atrophy. Patchy and confluent T2/FLAIR hyperintensity involving the periventricular and deep white matter both cerebral hemispheres most consistent with chronic small vessel ischemic disease. Remote right frontal and left cerebellar infarcts again noted. There has been continued interval evolution previously identified left PCA  territory infarct, subacute in appearance with associated petechial blood products  and/or laminar necrosis. This infarct enhances following contrast administration, consistent with subacute ischemia. No other areas of new restricted diffusion to suggest acute or interval infarction. Gray-white matter differentiation otherwise maintained. No acute intracranial hemorrhage. No mass lesion, midline shift or mass effect. No hydrocephalus or extra-axial fluid collection. Pituitary gland suprasellar region within normal limits. Midline structures intact. No other abnormal enhancement. Vascular: Major intracranial vascular flow voids are maintained. Skull and upper cervical spine: Craniocervical junction within normal limits. Bone marrow signal intensity within normal limits. No scalp soft tissue abnormality. Sinuses/Orbits: Globes orbital soft tissues demonstrate no acute finding. Paranasal sinuses are largely clear. No significant mastoid effusion. Inner ear structures grossly normal. Other: None. IMPRESSION: 1. No acute intracranial abnormality. 2. Normal expected interval evolution of subacute left PCA territory infarct. 3. Underlying age-related cerebral atrophy with moderate chronic small vessel ischemic disease, with multiple additional remote infarcts as above. Electronically Signed   By: Jeannine Boga M.D.   On: 12/27/2020 05:36      Management plans discussed with the patient, family and they are in agreement.  CODE STATUS:     Code Status Orders  (From admission, onward)           Start     Ordered   12/25/20 0832  Full code  Continuous        12/25/20 0833           Code Status History     Date Active Date Inactive Code Status Order ID Comments User Context   11/18/2020 0633 11/19/2020 2153 Full Code [id number]  Sidney Ace Arvella Merles, MD ED   11/04/2020 2150 11/08/2020 1952 Full Code [id number]  Mansy, Arvella Merles, MD ED   07/20/2019 1317 08/04/2019 1640 Full Code [id number]  Flora Lipps Inpatient   07/12/2019 2247 07/20/2019 1309 Full Code [id number]  Athena Masse, MD ED    05/28/2019 1811 05/29/2019 1931 Full Code [id number]  Jennye Boroughs, MD ED   10/17/2018 1722 10/19/2018 1601 Full Code [id number]  Lang Snow, NP ED       TOTAL TIME TAKING CARE OF THIS PATIENT: 35 minutes.    Loletha Grayer M.D on 12/27/2020 at 3:52 PM   [facility]  CC: Primary care physician; Clinic, [place]

## 2020-12-27 NOTE — Progress Notes (Signed)
SLP Cancellation Note  Patient Details Name: Brittany [name] MRN: [id number] DOB: 03-Dec-1960   Cancelled treatment:       Reason Eval/Treat Not Completed: SLP screened, no needs identified, will sign off (chart reviewed; consulted NSG and met w/ pt in room). MRI: "No acute intracranial abnormality. 2. Normal expected interval evolution of subacute left PCA territory infarct. 3. Underlying age-related cerebral atrophy with moderate chronic small vessel ischemic disease". CXR: No active disease.   Pt denied any difficulty swallowing and is currently on a regular diet; tolerates swallowing pills w/ water per NSG. Pt conversed in general conversation w/ No new expressive/receptive deficits reported by pt; pt denied any new speech-language deficits. Speech fully intelligible; pt is making all of her wants/needs known. Pt has a h/o Cognitive-linguistic communication deficits per chart notes in 2021 -- she was discharged from therapy then needing "assist in higher complex task and to utilizing recall strategies.". Pt lives w/ her family.   Any further skilled ST services can be had Outpatient if pt desires; pt appears close to/at her baseline re: communication abilities per her report. She was able to id 2 strategies to support her verbal communication when in conversation w/ others: slow down; start over and repeat if needed. NSG to reconsult if any change in status while admitted. Pt agreed. MD and CM updated.      Orinda Kenner, MS, CCC-SLP Speech Language Pathologist Rehab Services [phone] [facility] 12/27/2020, 12:13 PM

## 2021-02-13 ENCOUNTER — Other Ambulatory Visit: Payer: Self-pay

## 2021-02-13 ENCOUNTER — Emergency Department: Payer: Medicare Other

## 2021-02-13 DIAGNOSIS — Z79899 Other long term (current) drug therapy: Secondary | ICD-10-CM

## 2021-02-13 DIAGNOSIS — D136 Benign neoplasm of pancreas: Secondary | ICD-10-CM | POA: Diagnosis present

## 2021-02-13 DIAGNOSIS — K5720 Diverticulitis of large intestine with perforation and abscess without bleeding: Principal | ICD-10-CM | POA: Diagnosis present

## 2021-02-13 DIAGNOSIS — F329 Major depressive disorder, single episode, unspecified: Secondary | ICD-10-CM | POA: Diagnosis present

## 2021-02-13 DIAGNOSIS — F17200 Nicotine dependence, unspecified, uncomplicated: Secondary | ICD-10-CM | POA: Diagnosis present

## 2021-02-13 DIAGNOSIS — Z885 Allergy status to narcotic agent status: Secondary | ICD-10-CM

## 2021-02-13 DIAGNOSIS — Z881 Allergy status to other antibiotic agents status: Secondary | ICD-10-CM

## 2021-02-13 DIAGNOSIS — Z8507 Personal history of malignant neoplasm of pancreas: Secondary | ICD-10-CM

## 2021-02-13 DIAGNOSIS — Z20822 Contact with and (suspected) exposure to covid-19: Secondary | ICD-10-CM | POA: Diagnosis present

## 2021-02-13 DIAGNOSIS — R1031 Right lower quadrant pain: Secondary | ICD-10-CM | POA: Diagnosis not present

## 2021-02-13 DIAGNOSIS — Z90411 Acquired partial absence of pancreas: Secondary | ICD-10-CM

## 2021-02-13 DIAGNOSIS — Z803 Family history of malignant neoplasm of breast: Secondary | ICD-10-CM

## 2021-02-13 DIAGNOSIS — F32A Depression, unspecified: Secondary | ICD-10-CM | POA: Diagnosis present

## 2021-02-13 DIAGNOSIS — Z9081 Acquired absence of spleen: Secondary | ICD-10-CM

## 2021-02-13 DIAGNOSIS — E876 Hypokalemia: Secondary | ICD-10-CM | POA: Diagnosis present

## 2021-02-13 DIAGNOSIS — E871 Hypo-osmolality and hyponatremia: Secondary | ICD-10-CM | POA: Diagnosis present

## 2021-02-13 DIAGNOSIS — R350 Frequency of micturition: Secondary | ICD-10-CM | POA: Diagnosis present

## 2021-02-13 DIAGNOSIS — L930 Discoid lupus erythematosus: Secondary | ICD-10-CM | POA: Diagnosis present

## 2021-02-13 DIAGNOSIS — G2581 Restless legs syndrome: Secondary | ICD-10-CM | POA: Diagnosis present

## 2021-02-13 DIAGNOSIS — I10 Essential (primary) hypertension: Secondary | ICD-10-CM | POA: Diagnosis present

## 2021-02-13 DIAGNOSIS — Z8673 Personal history of transient ischemic attack (TIA), and cerebral infarction without residual deficits: Secondary | ICD-10-CM

## 2021-02-13 DIAGNOSIS — A0472 Enterocolitis due to Clostridium difficile, not specified as recurrent: Secondary | ICD-10-CM | POA: Diagnosis present

## 2021-02-13 DIAGNOSIS — K862 Cyst of pancreas: Secondary | ICD-10-CM | POA: Diagnosis present

## 2021-02-13 DIAGNOSIS — Z794 Long term (current) use of insulin: Secondary | ICD-10-CM

## 2021-02-13 DIAGNOSIS — Z681 Body mass index (BMI) 19 or less, adult: Secondary | ICD-10-CM

## 2021-02-13 DIAGNOSIS — Z818 Family history of other mental and behavioral disorders: Secondary | ICD-10-CM

## 2021-02-13 DIAGNOSIS — E1142 Type 2 diabetes mellitus with diabetic polyneuropathy: Secondary | ICD-10-CM | POA: Diagnosis present

## 2021-02-13 DIAGNOSIS — E43 Unspecified severe protein-calorie malnutrition: Secondary | ICD-10-CM | POA: Diagnosis present

## 2021-02-13 DIAGNOSIS — E785 Hyperlipidemia, unspecified: Secondary | ICD-10-CM | POA: Diagnosis present

## 2021-02-13 DIAGNOSIS — D75839 Thrombocytosis, unspecified: Secondary | ICD-10-CM | POA: Diagnosis present

## 2021-02-13 LAB — RESP PANEL BY RT-PCR (FLU A&B, COVID) ARPGX2
Influenza A by PCR: NEGATIVE
Influenza B by PCR: NEGATIVE
SARS Coronavirus 2 by RT PCR: NEGATIVE

## 2021-02-13 LAB — CBC
HCT: 40.6 % (ref 36.0–46.0)
Hemoglobin: 13.3 g/dL (ref 12.0–15.0)
MCH: 30.3 pg (ref 26.0–34.0)
MCHC: 32.8 g/dL (ref 30.0–36.0)
MCV: 92.5 fL (ref 80.0–100.0)
Platelets: 469 10*3/uL — ABNORMAL HIGH (ref 150–400)
RBC: 4.39 MIL/uL (ref 3.87–5.11)
RDW: 13 % (ref 11.5–15.5)
WBC: 9.7 10*3/uL (ref 4.0–10.5)
nRBC: 0 % (ref 0.0–0.2)

## 2021-02-13 LAB — BASIC METABOLIC PANEL
Anion gap: 17 — ABNORMAL HIGH (ref 5–15)
BUN: 11 mg/dL (ref 8–23)
CO2: 22 mmol/L (ref 22–32)
Calcium: 8.5 mg/dL — ABNORMAL LOW (ref 8.9–10.3)
Chloride: 88 mmol/L — ABNORMAL LOW (ref 98–111)
Creatinine, Ser: 0.69 mg/dL (ref 0.44–1.00)
GFR, Estimated: >60 mL/min (ref >60)
Glucose, Bld: 467 mg/dL — ABNORMAL HIGH (ref 70–99)
Potassium: 4.4 mmol/L (ref 3.5–5.1)
Sodium: 127 mmol/L — ABNORMAL LOW (ref 135–145)

## 2021-02-13 LAB — TROPONIN I (HIGH SENSITIVITY): Troponin I (High Sensitivity): 12 ng/L (ref <18)

## 2021-02-13 NOTE — ED Triage Notes (Addendum)
Pt presents to ER via ems from home.  Pt states "I feel terrible from head to toe."  Pt states she has been around her daughter who has been sick.  Pt states she is having body aches, has had loss of appetite and generalized weakness.  Pt A&O x4 at this time.  Pt hyperglycemic w/ems. Pt states she has not taken her lantus tonight because she didn't feel like it.

## 2021-02-14 ENCOUNTER — Inpatient Hospital Stay
Admission: EM | Admit: 2021-02-14 | Discharge: 2021-02-17 | DRG: 391 | Disposition: A | Discharge status: Home Health | Payer: Medicare Other | Attending: Obstetrics and Gynecology | Admitting: Obstetrics and Gynecology

## 2021-02-14 ENCOUNTER — Emergency Department: Payer: Medicare Other

## 2021-02-14 ENCOUNTER — Inpatient Hospital Stay: Payer: Medicare Other

## 2021-02-14 DIAGNOSIS — K5732 Diverticulitis of large intestine without perforation or abscess without bleeding: Secondary | ICD-10-CM | POA: Diagnosis not present

## 2021-02-14 DIAGNOSIS — E1142 Type 2 diabetes mellitus with diabetic polyneuropathy: Secondary | ICD-10-CM | POA: Diagnosis present

## 2021-02-14 DIAGNOSIS — K5720 Diverticulitis of large intestine with perforation and abscess without bleeding: Secondary | ICD-10-CM | POA: Diagnosis present

## 2021-02-14 DIAGNOSIS — E785 Hyperlipidemia, unspecified: Secondary | ICD-10-CM | POA: Diagnosis present

## 2021-02-14 DIAGNOSIS — K862 Cyst of pancreas: Secondary | ICD-10-CM

## 2021-02-14 DIAGNOSIS — K651 Peritoneal abscess: Secondary | ICD-10-CM

## 2021-02-14 DIAGNOSIS — E871 Hypo-osmolality and hyponatremia: Secondary | ICD-10-CM

## 2021-02-14 DIAGNOSIS — F32A Depression, unspecified: Secondary | ICD-10-CM | POA: Diagnosis present

## 2021-02-14 DIAGNOSIS — I10 Essential (primary) hypertension: Secondary | ICD-10-CM | POA: Diagnosis present

## 2021-02-14 DIAGNOSIS — K8689 Other specified diseases of pancreas: Secondary | ICD-10-CM

## 2021-02-14 DIAGNOSIS — Z8673 Personal history of transient ischemic attack (TIA), and cerebral infarction without residual deficits: Secondary | ICD-10-CM | POA: Diagnosis not present

## 2021-02-14 DIAGNOSIS — D136 Benign neoplasm of pancreas: Secondary | ICD-10-CM | POA: Diagnosis present

## 2021-02-14 DIAGNOSIS — A0472 Enterocolitis due to Clostridium difficile, not specified as recurrent: Secondary | ICD-10-CM

## 2021-02-14 DIAGNOSIS — E876 Hypokalemia: Secondary | ICD-10-CM | POA: Diagnosis present

## 2021-02-14 DIAGNOSIS — Z818 Family history of other mental and behavioral disorders: Secondary | ICD-10-CM | POA: Diagnosis not present

## 2021-02-14 DIAGNOSIS — K5792 Diverticulitis of intestine, part unspecified, without perforation or abscess without bleeding: Secondary | ICD-10-CM

## 2021-02-14 DIAGNOSIS — E43 Unspecified severe protein-calorie malnutrition: Secondary | ICD-10-CM | POA: Diagnosis present

## 2021-02-14 DIAGNOSIS — Z681 Body mass index (BMI) 19 or less, adult: Secondary | ICD-10-CM | POA: Diagnosis not present

## 2021-02-14 DIAGNOSIS — R1031 Right lower quadrant pain: Secondary | ICD-10-CM | POA: Diagnosis present

## 2021-02-14 DIAGNOSIS — Z90411 Acquired partial absence of pancreas: Secondary | ICD-10-CM | POA: Diagnosis not present

## 2021-02-14 DIAGNOSIS — Z803 Family history of malignant neoplasm of breast: Secondary | ICD-10-CM | POA: Diagnosis not present

## 2021-02-14 DIAGNOSIS — K5780 Diverticulitis of intestine, part unspecified, with perforation and abscess without bleeding: Secondary | ICD-10-CM | POA: Diagnosis not present

## 2021-02-14 DIAGNOSIS — E440 Moderate protein-calorie malnutrition: Secondary | ICD-10-CM | POA: Insufficient documentation

## 2021-02-14 DIAGNOSIS — Z20822 Contact with and (suspected) exposure to covid-19: Secondary | ICD-10-CM | POA: Diagnosis present

## 2021-02-14 DIAGNOSIS — Z8507 Personal history of malignant neoplasm of pancreas: Secondary | ICD-10-CM | POA: Diagnosis not present

## 2021-02-14 DIAGNOSIS — F17200 Nicotine dependence, unspecified, uncomplicated: Secondary | ICD-10-CM | POA: Diagnosis present

## 2021-02-14 DIAGNOSIS — K831 Obstruction of bile duct: Secondary | ICD-10-CM

## 2021-02-14 DIAGNOSIS — D75839 Thrombocytosis, unspecified: Secondary | ICD-10-CM | POA: Diagnosis present

## 2021-02-14 DIAGNOSIS — G2581 Restless legs syndrome: Secondary | ICD-10-CM | POA: Diagnosis present

## 2021-02-14 DIAGNOSIS — F329 Major depressive disorder, single episode, unspecified: Secondary | ICD-10-CM | POA: Diagnosis present

## 2021-02-14 DIAGNOSIS — E1110 Type 2 diabetes mellitus with ketoacidosis without coma: Secondary | ICD-10-CM

## 2021-02-14 DIAGNOSIS — L930 Discoid lupus erythematosus: Secondary | ICD-10-CM | POA: Diagnosis present

## 2021-02-14 DIAGNOSIS — A0471 Enterocolitis due to Clostridium difficile, recurrent: Secondary | ICD-10-CM

## 2021-02-14 DIAGNOSIS — Z9081 Acquired absence of spleen: Secondary | ICD-10-CM | POA: Diagnosis not present

## 2021-02-14 LAB — BASIC METABOLIC PANEL
Anion gap: 15 (ref 5–15)
BUN: 14 mg/dL (ref 8–23)
CO2: 23 mmol/L (ref 22–32)
Calcium: 8.4 mg/dL — ABNORMAL LOW (ref 8.9–10.3)
Chloride: 85 mmol/L — ABNORMAL LOW (ref 98–111)
Creatinine, Ser: 0.78 mg/dL (ref 0.44–1.00)
GFR, Estimated: >60 mL/min (ref >60)
Glucose, Bld: 594 mg/dL (ref 70–99)
Potassium: 4.2 mmol/L (ref 3.5–5.1)
Sodium: 123 mmol/L — ABNORMAL LOW (ref 135–145)

## 2021-02-14 LAB — CBG MONITORING, ED
Glucose-Capillary: 127 mg/dL — ABNORMAL HIGH (ref 70–99)
Glucose-Capillary: 145 mg/dL — ABNORMAL HIGH (ref 70–99)
Glucose-Capillary: 160 mg/dL — ABNORMAL HIGH (ref 70–99)
Glucose-Capillary: 174 mg/dL — ABNORMAL HIGH (ref 70–99)
Glucose-Capillary: 178 mg/dL — ABNORMAL HIGH (ref 70–99)
Glucose-Capillary: 183 mg/dL — ABNORMAL HIGH (ref 70–99)
Glucose-Capillary: 261 mg/dL — ABNORMAL HIGH (ref 70–99)
Glucose-Capillary: 293 mg/dL — ABNORMAL HIGH (ref 70–99)
Glucose-Capillary: 367 mg/dL — ABNORMAL HIGH (ref 70–99)
Glucose-Capillary: 437 mg/dL — ABNORMAL HIGH (ref 70–99)
Glucose-Capillary: 531 mg/dL (ref 70–99)
Glucose-Capillary: 544 mg/dL (ref 70–99)
Glucose-Capillary: >600 mg/dL (ref 70–99)

## 2021-02-14 LAB — CBC
HCT: 39 % (ref 36.0–46.0)
Hemoglobin: 12.7 g/dL (ref 12.0–15.0)
MCH: 30.5 pg (ref 26.0–34.0)
MCHC: 32.6 g/dL (ref 30.0–36.0)
MCV: 93.5 fL (ref 80.0–100.0)
Platelets: 542 10*3/uL — ABNORMAL HIGH (ref 150–400)
RBC: 4.17 MIL/uL (ref 3.87–5.11)
RDW: 13.2 % (ref 11.5–15.5)
WBC: 9.8 10*3/uL (ref 4.0–10.5)
nRBC: 0 % (ref 0.0–0.2)

## 2021-02-14 LAB — URINALYSIS, ROUTINE W REFLEX MICROSCOPIC
Bilirubin Urine: NEGATIVE
Glucose, UA: >=500 mg/dL — AB
Hgb urine dipstick: NEGATIVE
Ketones, ur: 5 mg/dL — AB
Leukocytes,Ua: NEGATIVE
Nitrite: NEGATIVE
Protein, ur: NEGATIVE mg/dL
Specific Gravity, Urine: 1.016 (ref 1.005–1.030)
Squamous Epithelial / HPF: NONE SEEN (ref 0–5)
pH: 5 (ref 5.0–8.0)

## 2021-02-14 LAB — HEPATIC FUNCTION PANEL
ALT: 13 U/L (ref 0–44)
AST: 15 U/L (ref 15–41)
Albumin: 2.6 g/dL — ABNORMAL LOW (ref 3.5–5.0)
Alkaline Phosphatase: 117 U/L (ref 38–126)
Bilirubin, Direct: 0.1 mg/dL (ref 0.0–0.2)
Indirect Bilirubin: 1.1 mg/dL — ABNORMAL HIGH (ref 0.3–0.9)
Total Bilirubin: 1.2 mg/dL (ref 0.3–1.2)
Total Protein: 7.3 g/dL (ref 6.5–8.1)

## 2021-02-14 LAB — BETA-HYDROXYBUTYRIC ACID: Beta-Hydroxybutyric Acid: 5.79 mmol/L — ABNORMAL HIGH (ref 0.05–0.27)

## 2021-02-14 LAB — LIPASE, BLOOD: Lipase: 194 U/L — ABNORMAL HIGH (ref 11–51)

## 2021-02-14 LAB — LACTIC ACID, PLASMA: Lactic Acid, Venous: 1.6 mmol/L (ref 0.5–1.9)

## 2021-02-14 LAB — TROPONIN I (HIGH SENSITIVITY): Troponin I (High Sensitivity): 12 ng/L (ref <18)

## 2021-02-14 MED ORDER — INSULIN REGULAR(HUMAN) IN NACL 100-0.9 UT/100ML-% IV SOLN
INTRAVENOUS | Status: DC
  Administered 2021-02-14: 8.5 [IU]/h via INTRAVENOUS
  Filled 2021-02-14: qty 100

## 2021-02-14 MED ORDER — BISACODYL 10 MG RE SUPP
10.0000 mg | Freq: Every day | RECTAL | Status: DC | PRN
  Filled 2021-02-14: qty 1

## 2021-02-14 MED ORDER — POTASSIUM CHLORIDE 10 MEQ/100ML IV SOLN
10.0000 meq | INTRAVENOUS | Status: AC
  Administered 2021-02-14 (×2): 10 meq via INTRAVENOUS
  Filled 2021-02-14 (×2): qty 100

## 2021-02-14 MED ORDER — ACETAMINOPHEN 650 MG RE SUPP: 650.0000 mg | Freq: Four times a day (QID) | RECTAL | Status: DC | PRN

## 2021-02-14 MED ORDER — IOHEXOL 300 MG/ML  SOLN
100.0000 mL | Freq: Once | INTRAMUSCULAR | Status: AC | PRN
  Administered 2021-02-14: 80 mL via INTRAVENOUS
  Filled 2021-02-14: qty 100

## 2021-02-14 MED ORDER — PANCRELIPASE (LIP-PROT-AMYL) 12000-38000 UNITS PO CPEP
24000.0000 [IU] | ORAL_CAPSULE | Freq: Three times a day (TID) | ORAL | Status: DC
  Filled 2021-02-14 (×3): qty 2

## 2021-02-14 MED ORDER — LACTATED RINGERS IV SOLN: INTRAVENOUS | Status: DC

## 2021-02-14 MED ORDER — DEXTROSE 50 % IV SOLN: 0.0000 mL | INTRAVENOUS | Status: DC | PRN

## 2021-02-14 MED ORDER — INSULIN ASPART 100 UNIT/ML IJ SOLN
0.0000 [IU] | INTRAMUSCULAR | Status: DC
  Administered 2021-02-14: 3 [IU] via SUBCUTANEOUS
  Administered 2021-02-14: 2 [IU] via SUBCUTANEOUS
  Administered 2021-02-14 – 2021-02-15 (×2): 3 [IU] via SUBCUTANEOUS
  Administered 2021-02-15: 2 [IU] via SUBCUTANEOUS
  Administered 2021-02-15: 11 [IU] via SUBCUTANEOUS
  Administered 2021-02-16: 3 [IU] via SUBCUTANEOUS
  Filled 2021-02-14 (×8): qty 1

## 2021-02-14 MED ORDER — ACETAMINOPHEN 325 MG PO TABS
650.0000 mg | ORAL_TABLET | Freq: Four times a day (QID) | ORAL | Status: DC | PRN
  Administered 2021-02-16: 650 mg via ORAL
  Filled 2021-02-14: qty 2

## 2021-02-14 MED ORDER — LACTATED RINGERS IV BOLUS
1000.0000 mL | Freq: Once | INTRAVENOUS | Status: AC
  Administered 2021-02-14: 1000 mL via INTRAVENOUS

## 2021-02-14 MED ORDER — DEXTROSE IN LACTATED RINGERS 5 % IV SOLN: INTRAVENOUS | Status: DC

## 2021-02-14 MED ORDER — PIPERACILLIN-TAZOBACTAM 3.375 G IVPB
3.3750 g | Freq: Three times a day (TID) | INTRAVENOUS | Status: DC
  Administered 2021-02-14 – 2021-02-17 (×9): 3.375 g via INTRAVENOUS
  Filled 2021-02-14 (×9): qty 50

## 2021-02-14 MED ORDER — OXYCODONE HCL 5 MG PO TABS: 5.0000 mg | ORAL_TABLET | ORAL | Status: DC | PRN

## 2021-02-14 MED ORDER — ONDANSETRON HCL 4 MG PO TABS: 4.0000 mg | ORAL_TABLET | Freq: Four times a day (QID) | ORAL | Status: DC | PRN

## 2021-02-14 MED ORDER — GADOBUTROL 1 MMOL/ML IV SOLN
4.0000 mL | Freq: Once | INTRAVENOUS | Status: AC | PRN
  Administered 2021-02-14: 4 mL via INTRAVENOUS
  Filled 2021-02-14: qty 4

## 2021-02-14 MED ORDER — MORPHINE SULFATE (PF) 2 MG/ML IV SOLN: 1.0000 mg | INTRAVENOUS | Status: DC | PRN

## 2021-02-14 MED ORDER — LACTATED RINGERS IV BOLUS
1000.0000 mL | Freq: Once | INTRAVENOUS | Status: AC
Start: 2021-02-14 — End: 2021-02-14
  Administered 2021-02-14: 1000 mL via INTRAVENOUS

## 2021-02-14 MED ORDER — PIPERACILLIN-TAZOBACTAM 3.375 G IVPB 30 MIN
3.3750 g | Freq: Once | INTRAVENOUS | Status: AC
  Administered 2021-02-14: 3.375 g via INTRAVENOUS
  Filled 2021-02-14: qty 50

## 2021-02-14 MED ORDER — ROPINIROLE HCL 1 MG PO TABS
1.0000 mg | ORAL_TABLET | Freq: Two times a day (BID) | ORAL | Status: DC
  Administered 2021-02-14 (×2): 1 mg via ORAL
  Filled 2021-02-14 (×3): qty 1

## 2021-02-14 MED ORDER — SODIUM CHLORIDE 0.9% FLUSH
3.0000 mL | Freq: Two times a day (BID) | INTRAVENOUS | Status: DC
  Administered 2021-02-14 – 2021-02-15 (×2): 3 mL via INTRAVENOUS

## 2021-02-14 MED ORDER — HYDRALAZINE HCL 20 MG/ML IJ SOLN
20.0000 mg | Freq: Four times a day (QID) | INTRAMUSCULAR | Status: DC | PRN
  Administered 2021-02-14: 20 mg via INTRAVENOUS
  Filled 2021-02-14 (×2): qty 1

## 2021-02-14 MED ORDER — ONDANSETRON HCL 4 MG/2ML IJ SOLN
4.0000 mg | Freq: Four times a day (QID) | INTRAMUSCULAR | Status: DC | PRN
  Administered 2021-02-14: 4 mg via INTRAVENOUS
  Filled 2021-02-14: qty 2

## 2021-02-14 MED ORDER — CITALOPRAM HYDROBROMIDE 20 MG PO TABS
10.0000 mg | ORAL_TABLET | Freq: Every day | ORAL | Status: DC
  Administered 2021-02-14: 10 mg via ORAL
  Filled 2021-02-14: qty 1

## 2021-02-14 MED ORDER — INSULIN GLARGINE-YFGN 100 UNIT/ML ~~LOC~~ SOLN
10.0000 [IU] | Freq: Every day | SUBCUTANEOUS | Status: DC
  Administered 2021-02-14 – 2021-02-17 (×4): 10 [IU] via SUBCUTANEOUS
  Filled 2021-02-14 (×5): qty 0.1

## 2021-02-14 NOTE — Consult Note (Signed)
PHARMACY -  BRIEF ANTIBIOTIC NOTE   Pharmacy has received consult(s) for Zosyn from an ED provider.  The patient's profile has been reviewed for ht/wt/allergies/indication/available labs.    One time order(s) placed for Zosyn 3.375g IV x 1 dose.  Further antibiotics/pharmacy consults should be ordered by admitting physician if indicated.                       Thank you, Pearla Dubonnet 02/14/2021  11:31 AM

## 2021-02-14 NOTE — ED Notes (Signed)
This RN called & spoke with lab to request a phlebotomist obtain blood cx's due to difficulty getting access on this pt.

## 2021-02-14 NOTE — ED Provider Notes (Signed)
[facility] Provider Note    Event Date/Time   First MD Initiated Contact with Patient 02/14/21 [phone]     (approximate)   History   Hyperglycemia and Weakness   HPI  Brittany [name] is a 61 y.o. female here with multiple complaints.  Patient markedly tearful, somewhat limiting history.  She states that she feels "awful" but states she has been feeling awful for at least several weeks.  She states that over the last week or so, she has had progressive worsening hyperglycemia, generalized weakness, diarrhea, and increased thirst.  She has had diffuse body aches.  She has had weakness.  She states she has had decreased appetite and it has been approximately 24 hours since she ate a solid meal.  She said nausea and vomiting.  Reports she feels generally weak, short of breath.  Denies known specific sick contacts.  She said diffuse body aches.  She has been taking her medications as prescribed.  She is not sure why her sugars have been so high, and states she has been taking her insulin.     Physical Exam   Triage Vital Signs: ED Triage Vitals  Enc Vitals Group     BP 02/13/21 2028 (!) 127/93     Pulse Rate 02/13/21 2028 100     Resp 02/13/21 2028 19     Temp 02/13/21 2028 98.5 F (36.9 C)     Temp Source 02/13/21 2028 Oral     SpO2 02/13/21 2028 98 %     Weight 02/13/21 2031 90 lb (40.8 kg)     Height 02/13/21 2031 5' (1.524 m)     Head Circumference --      Peak Flow --      Pain Score 02/13/21 2030 10     Pain Loc --      Pain Edu? --      Excl. in [place]? --     Most recent vital signs: Vitals:   02/14/21 0735 02/14/21 0923  BP: (!) 138/93 (!) 148/74  Pulse: 85 76  Resp: 17 16  Temp: 97.6 F (36.4 C)   SpO2: 97% 98%     General: Awake, crying, appears in mild distress. CV:  Good peripheral perfusion.  Slight tachycardia noted.  No murmurs.   Resp:  Normal effort.  Lungs clear bilaterally.  No increased work of breathing. Abd:  No distention.   Diffuse tenderness, with slight guarding.  No peritoneal signs. Other:  Dry mucous membranes noted.  Slight skin tenting noted in the hands.   ED Results / Procedures / Treatments   Labs (all labs ordered are listed, but only abnormal results are displayed) Labs Reviewed  BASIC METABOLIC PANEL - Abnormal; Notable for the following components:      Result Value   Sodium 127 (*)    Chloride 88 (*)    Glucose, Bld 467 (*)    Calcium 8.5 (*)    Anion gap 17 (*)    All other components within normal limits  CBC - Abnormal; Notable for the following components:   Platelets 469 (*)    All other components within normal limits  BETA-HYDROXYBUTYRIC ACID - Abnormal; Notable for the following components:   Beta-Hydroxybutyric Acid 5.79 (*)    All other components within normal limits  BASIC METABOLIC PANEL - Abnormal; Notable for the following components:   Sodium 123 (*)    Chloride 85 (*)    Glucose, Bld 594 (*)  Calcium 8.4 (*)    All other components within normal limits  CBG MONITORING, ED - Abnormal; Notable for the following components:   Glucose-Capillary 544 (*)    All other components within normal limits  CBG MONITORING, ED - Abnormal; Notable for the following components:   Glucose-Capillary >600 (*)    All other components within normal limits  CBG MONITORING, ED - Abnormal; Notable for the following components:   Glucose-Capillary 531 (*)    All other components within normal limits  CBG MONITORING, ED - Abnormal; Notable for the following components:   Glucose-Capillary 437 (*)    All other components within normal limits  CBG MONITORING, ED - Abnormal; Notable for the following components:   Glucose-Capillary 367 (*)    All other components within normal limits  RESP PANEL BY RT-PCR (FLU A&B, COVID) ARPGX2  C DIFFICILE QUICK SCREEN W PCR REFLEX    GASTROINTESTINAL PANEL BY PCR, STOOL (REPLACES STOOL CULTURE)  CULTURE, BLOOD (ROUTINE X 2)  CULTURE, BLOOD (ROUTINE  X 2)  BLOOD GAS, VENOUS  URINALYSIS, ROUTINE W REFLEX MICROSCOPIC  BASIC METABOLIC PANEL  TROPONIN I (HIGH SENSITIVITY)  TROPONIN I (HIGH SENSITIVITY)     EKG  Sinus tachycardia, ventricular rate 107.  PR 116, QRS 68, QTc 477.  No acute ST elevations or depressions.  No acute evidence of acute ischemia or infarct.   RADIOLOGY Chest x-ray: I reviewed the images, no acute abnormality seen.  Radiology read as no acute disease.  Agree with assessment. CT abdomen/pelvis: Significant acute diverticulitis with small abscess, no evidence of free air on my preliminary review.  Agree with radiology interpretation.    PROCEDURES:  Critical Care performed: Yes, see critical care procedure note(s)  .Critical Care Performed by: Duffy Bruce, MD Authorized by: Duffy Bruce, MD   Critical care provider statement:    Critical care time (minutes):  35   Critical care time was exclusive of:  Separately billable procedures and treating other patients   Critical care was necessary to treat or prevent imminent or life-threatening deterioration of the following conditions:  Cardiac failure, circulatory failure and sepsis   Critical care was time spent personally by me on the following activities:  Development of treatment plan with patient or surrogate, discussions with consultants, evaluation of patient's response to treatment, examination of patient, ordering and review of laboratory studies, ordering and review of radiographic studies, ordering and performing treatments and interventions, pulse oximetry, re-evaluation of patient's condition and review of old charts .1-3 Lead EKG Interpretation Performed by: Duffy Bruce, MD Authorized by: Duffy Bruce, MD     Interpretation: normal     ECG rate:  70-90   ECG rate assessment: normal     Rhythm: sinus rhythm     Ectopy: none     Conduction: normal   Comments:     Indication: Weakness   MEDICATIONS ORDERED IN ED: Medications   insulin regular, human (MYXREDLIN) 100 units/ 100 mL infusion (6.5 Units/hr Intravenous Rate/Dose Change 02/14/21 1136)  lactated ringers infusion ( Intravenous New Bag/Given 02/14/21 1008)  dextrose 5 % in lactated ringers infusion (has no administration in time range)  dextrose 50 % solution 0-50 mL (has no administration in time range)  potassium chloride 10 mEq in 100 mL IVPB (10 mEq Intravenous New Bag/Given 02/14/21 1108)  piperacillin-tazobactam (ZOSYN) IVPB 3.375 g (has no administration in time range)  lactated ringers bolus 1,000 mL (0 mLs Intravenous Stopped 02/14/21 0932)  lactated ringers bolus 1,000 mL (0  mLs Intravenous Stopped 02/14/21 1135)  iohexol (OMNIPAQUE) 300 MG/ML solution 100 mL (80 mLs Intravenous Contrast Given 02/14/21 0951)     IMPRESSION / MDM / ASSESSMENT AND PLAN / ED COURSE  I reviewed the triage vital signs and the nursing notes.                              Differential diagnosis includes, but is not limited to, diverticulitis, colitis, DKA, ischemic colitis, sepsis, hypoglycemia causing osmotic diarrhea and DKA.  The patient is on the cardiac monitor to evaluate for evidence of arrhythmia and/or significant heart rate changes.  T53-year-old female with past medical history of type 2 diabetes, COPD, depression, history of stroke, history of diverticulitis, polysubstance use, here with diffuse abdominal pain.  Patient in moderate distress on arrival, dehydrated clinically with diffuse abdominal tenderness though no rebound or peritoneal signs.  Patient unfortunately had a long waiting room today due to staffing issues.  I reviewed her labs from overnight which show anion gap of 15 with blood sugar of 467.  Bicarb is 22.  Clinically, however, suspect mild DKA with dry mucous membranes and slight tachypnea and shortness of breath.  Glucose greater than 600 now.  She has been unable to tolerate p.o. and is having profuse diarrhea, likely contributing to her volume depletion.   Repeat BMP sent, shows persistent elevation in anion gap and hyperglycemia though pH is normal on blood gas.  She does not appear significantly septic clinically, but does appear to feel unwell.  She is mentating well.  Regarding her diarrhea, CT scan obtained and shows diverticulitis with small abscess.  There is no evidence of free air.  Patient will be started on IV Zosyn for her diverticulitis with cultures, in addition to IV fluid resuscitation and initiation of IV insulin drip.  I reviewed her previous admissions, most recently in November 2022 for diverticulitis, altered mental status and hyperglycemia with somewhat similar presentation.  FINAL CLINICAL IMPRESSION(S) / ED DIAGNOSES   Final diagnoses:  Diabetic ketoacidosis without coma associated with type 2 diabetes mellitus ([place])  Diverticulitis     Rx / DC Orders   ED Discharge Orders     None        Note:  This document was prepared using Dragon voice recognition software and may include unintentional dictation errors.   Duffy Bruce, MD 02/14/21 1151

## 2021-02-14 NOTE — H&P (Addendum)
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 The abd pain and diarrhea has become progressively worse over the past week. The abd pain is located at lower right and left side of the abdomen. The pain is full, constant, w/ radiation to the back. Rubbing her belly helps the pain somewhat better and nothing makes the pain worse. The severity is currently 10/10. Pt has 3-4 episodes of diarrhea a day but denies any bloody or black tarry stools. Pt has had colonoscopy before in which polyps were removed that were benign. Pt is unsure of how long ago the colonoscopy was done. Also, pt c/o intermittent nausea. Pt denies any fever, chills, sweating, cough, chest pain, vomiting, dysuria, or constipation.   Review of Systems: As per HPI otherwise 14 point review of systems negative.    Past Medical History:  Diagnosis Date   Cancer ([place])    Collagen vascular disease ([place])    Diabetes mellitus without complication ([place])    Hypertension    Lupus ([place])     Past Surgical History:  Procedure Laterality Date   PANCREATECTOMY     spleenectomy       reports that she has been smoking. She has never used smokeless tobacco. She reports current alcohol use. She reports that she does not currently use drugs.  Allergies  Allergen Reactions   Cephalosporins Itching   Hydromorphone Itching   Keflin [Cephalothin] Itching    Family History  Problem Relation Age of Onset   Anxiety disorder Sister    Breast cancer Maternal Aunt      Prior to Admission medications   Medication Sig Start Date End Date Taking?  Authorizing Provider  insulin glargine (LANTUS SOLOSTAR) 100 UNIT/ML Solostar Pen Inject 40 Units into the skin at bedtime. 01/11/21 01/11/22 Yes [provider]  potassium chloride SA (KLOR-CON M) 20 MEQ tablet Take 2 tablets by mouth daily. 01/09/21 01/09/22 Yes [provider]  aspirin 81 MG chewable tablet Chew 1 tablet (81 mg total) by mouth daily. Patient not taking: Reported on 12/25/2020 07/20/19   Sharen Hones, MD  atorvastatin (LIPITOR) 80 MG tablet Take 1 tablet (80 mg total) by mouth Nightly. 11/08/20 12/25/20  Pokhrel, Corrie Mckusick, MD  citalopram (CELEXA) 10 MG tablet Take 1 tablet (10 mg total) by mouth daily. 12/27/20   Loletha Grayer, MD  diclofenac Sodium (VOLTAREN) 1 % GEL Apply 2 g topically 4 (four) times daily. 01/09/21   [provider]  feeding supplement, GLUCERNA SHAKE, (GLUCERNA SHAKE) LIQD Take 237 mLs by mouth 3 (three) times daily between meals. 12/27/20   Loletha Grayer, MD  fluconazole (DIFLUCAN) 200 MG tablet Take 400 mg by mouth daily. Patient not taking: Reported on 02/14/2021 12/17/20   [provider]  folic acid (FOLVITE) 1 MG tablet Take 1 tablet (1 mg total) by mouth daily. 12/27/20   Loletha Grayer, MD  gabapentin (NEURONTIN) 600 MG tablet Take 1 tablet (600 mg total) by mouth at bedtime. 08/04/19   Love, Ivan Anchors, PA-C  hydrOXYzine (ATARAX/VISTARIL) 10 MG tablet Take 10 mg by mouth 2 (two) times daily.  12/31/16  [provider]  insulin NPH-regular Human (NOVOLIN 70/30) (70-30) 100 UNIT/ML injection Inject 28 Units into the skin 2 (two) times daily with a meal. 08/04/19   Love, Ivan Anchors, PA-C  levofloxacin (LEVAQUIN) 750 MG tablet Take 750 mg by mouth daily. Patient not taking: Reported on 02/14/2021 12/17/20   [provider]  Menthol-Methyl Salicylate (MUSCLE RUB) 10-15 % CREA Apply 1 application topically 2 (two) times daily as needed for muscle pain. 08/04/19   Love, Ivan Anchors, PA-C  metroNIDAZOLE (FLAGYL) 500  MG tablet Take 500 mg by mouth every 12 (twelve) hours. Patient not taking: Reported on 02/14/2021 12/17/20   [provider]  mirtazapine (REMERON) 15 MG tablet Take 1 tablet (15 mg total) by mouth at bedtime. 11/08/20 11/08/21  Pokhrel, Corrie Mckusick, MD  Multiple Vitamin (MULTIVITAMIN WITH MINERALS) TABS tablet Take 1 tablet by mouth daily. Patient not taking: Reported on 12/25/2020 08/05/19   Love, Ivan Anchors, PA-C  ondansetron (ZOFRAN ODT) 4 MG disintegrating tablet Take 1 tablet (4 mg total) by mouth every 8 (eight) hours as needed. 12/27/20   Loletha Grayer, MD  Pancrelipase, Lip-Prot-Amyl, 24000-76000 units CPEP Take 3 capsules (72,000 Units total) by mouth 3 (three) times daily. 12/27/20   Loletha Grayer, MD  rOPINIRole (REQUIP) 1 MG tablet Take 1 tablet (1 mg total) by mouth 2 (two) times daily. 08/04/19   Bary Leriche, PA-C    Physical Exam: Vitals:   02/13/21 2031 02/14/21 0330 02/14/21 0735 02/14/21 0923  BP:  130/87 (!) 138/93 (!) 148/74  Pulse:  (!) 107 85 76  Resp:  19 17 16   Temp:   97.6 F (36.4 C)   TempSrc:   Axillary   SpO2:  100% 97% 98%  Weight: 40.8 kg     Height: 5' (1.524 m)       Constitutional: restless & uncomfortable Vitals:   02/13/21 2031 02/14/21 0330 02/14/21 0735 02/14/21 0923  BP:  130/87 (!) 138/93 (!) 148/74  Pulse:  (!) 107 85 76  Resp:  19 17 16   Temp:   97.6 F (36.4 C)   TempSrc:   Axillary   SpO2:  100% 97% 98%  Weight: 40.8 kg     Height: 5' (1.524 m)      Eyes: PERRL, lids and conjunctivae normal ENMT: Mucous membranes are moist.  Neck: normal, supple Respiratory: clear breath sounds b/l. No wheezes, rales or rhonchi  Cardiovascular: S1/S2+, norubs / gallops. No extremity edema.  Abdomen: soft, tenderness to palpation, ND & hyperactive bowel sounds.  Musculoskeletal: no clubbing / cyanosis. Normal muscle tone.  Skin: no rashes, lesions. Neurologic: CN 2-12 grossly intact. Moves all extremities  Psychiatric: Normal judgment and  insight. Alert and oriented x 3. Appears agitated   Labs on Admission: I have personally reviewed following labs and imaging studies  CBC: Recent Labs  Lab 02/13/21 2036  WBC 9.7  HGB 13.3  HCT 40.6  MCV 92.5  PLT 035*   Basic Metabolic Panel: Recent Labs  Lab 02/13/21 2036 02/14/21 0927  NA 127* 123*  K 4.4 4.2  CL 88* 85*  CO2 22 23  GLUCOSE 467* 594*  BUN 11 14  CREATININE 0.69 0.78  CALCIUM 8.5* 8.4*   GFR: Estimated Creatinine Clearance: 47.6 mL/min (by C-G formula based on SCr of 0.78 mg/dL). Liver Function Tests: No results for input(s): AST, ALT, ALKPHOS, BILITOT, PROT, ALBUMIN in the last 168 hours. No results for input(s): LIPASE, AMYLASE in the last 168 hours. No results  for input(s): AMMONIA in the last 168 hours. Coagulation Profile: No results for input(s): INR, PROTIME in the last 168 hours. Cardiac Enzymes: No results for input(s): CKTOTAL, CKMB, CKMBINDEX, TROPONINI in the last 168 hours. BNP (last 3 results) No results for input(s): PROBNP in the last 8760 hours. HbA1C: No results for input(s): HGBA1C in the last 72 hours. CBG: Recent Labs  Lab 02/14/21 0700 02/14/21 1005 02/14/21 1056 02/14/21 1134 02/14/21 1236  GLUCAP >600* 531* 437* 367* 293*   Lipid Profile: No results for input(s): CHOL, HDL, LDLCALC, TRIG, CHOLHDL, LDLDIRECT in the last 72 hours. Thyroid Function Tests: No results for input(s): TSH, T4TOTAL, FREET4, T3FREE, THYROIDAB in the last 72 hours. Anemia Panel: No results for input(s): VITAMINB12, FOLATE, FERRITIN, TIBC, IRON, RETICCTPCT in the last 72 hours. Urine analysis:    Component Value Date/Time   COLORURINE YELLOW (A) 12/25/2020 0532   APPEARANCEUR CLEAR (A) 12/25/2020 0532   APPEARANCEUR Hazy 04/04/2011 1935   LABSPEC 1.015 12/25/2020 0532   LABSPEC >1.060 04/04/2011 1935   PHURINE 6.0 12/25/2020 0532   GLUCOSEU >=500 (A) 12/25/2020 0532   GLUCOSEU 50 mg/dL 04/04/2011 1935   HGBUR NEGATIVE 12/25/2020 0532    BILIRUBINUR NEGATIVE 12/25/2020 0532   BILIRUBINUR Negative 04/04/2011 1935   KETONESUR NEGATIVE 12/25/2020 0532   PROTEINUR NEGATIVE 12/25/2020 0532   NITRITE NEGATIVE 12/25/2020 0532   LEUKOCYTESUR NEGATIVE 12/25/2020 0532   LEUKOCYTESUR Negative 04/04/2011 1935    Radiological Exams on Admission: DG Chest 2 View  Result Date: 02/13/2021 CLINICAL DATA:  Body aches and loss of appetite. EXAM: CHEST - 2 VIEW COMPARISON:  December 25, 2020 FINDINGS: The heart size and mediastinal contours are within normal limits. Both lungs are clear. Radiopaque surgical clips are seen within the left upper quadrant. The visualized skeletal structures are unremarkable. IMPRESSION: No active cardiopulmonary disease. Electronically Signed   By: Virgina Norfolk M.D.   On: 02/13/2021 23:58   CT ABDOMEN PELVIS W CONTRAST  Result Date: 02/14/2021 CLINICAL DATA:  Abdominal pain and body aches. Loss of appetite and generalized weakness. EXAM: CT ABDOMEN AND PELVIS WITH CONTRAST TECHNIQUE: Multidetector CT imaging of the abdomen and pelvis was performed using the standard protocol following bolus administration of intravenous contrast. CONTRAST:  64mL OMNIPAQUE IOHEXOL 300 MG/ML  SOLN COMPARISON:  03/16/2019 FINDINGS: Lower chest: The lung bases are clear of acute process. No pleural effusion or pulmonary lesions. The heart is normal in size. No pericardial effusion. The distal esophagus and aorta are unremarkable. Hepatobiliary: No hepatic lesions or intrahepatic biliary dilatation. The gallbladder is unremarkable. Marked common bile duct dilatation, new since prior study measuring up to 15 mm in the head of the pancreas. No obvious common bile duct stone. Pancreas: Surgical changes related to a remote partial pancreatectomy. The body and tail of been resected. The head is still present. There is a large complex multi septated cystic lesion in the pancreatic head measuring a maximum of 4.5 x 3.0 cm. This has enlarged  since the prior CT scan where it measured approximately 2.5 x 1.1 cm. Findings highly suspicious for IPMN. Recommend follow-up MR imaging of the abdomen without and with contrast or endoscopic ultrasound and potential biopsy. Spleen: Surgically absent. Adrenals/Urinary Tract: Adrenal glands and kidneys are unremarkable. No renal lesions or hydronephrosis. The bladder is unremarkable. Stomach/Bowel: The stomach, duodenum and small bowel are grossly normal without oral contrast. No mass lesions or obstructive findings. There is a severe inflammatory process involving the distal descending colon and upper sigmoid colon  with marked wall thickening and mucosal and serosal enhancement. Although this could be an inflammatory or infectious colitis think it is most likely diverticulitis given the severe underlying diverticulosis. There appears to be a small adjacent abscess measuring 3.4 cm on image 61/2. Vascular/Lymphatic: The aorta demonstrates advanced atherosclerotic calcifications but no aneurysm dissection. Branch vessel calcifications are also noted. The major venous structures are grossly patent. No mesenteric or retroperitoneal adenopathy. Reproductive: The uterus and ovaries are unremarkable. Other: No free pelvic fluid collections. No inguinal mass or adenopathy. Musculoskeletal: No significant bony findings. IMPRESSION: 1. Severe inflammatory or infectious colitis involving the distal descending colon and upper sigmoid colon, most likely diverticulitis. There is a small adjacent abscess measuring 3.4 cm. 2. Enlarging complex multi septated cystic lesion in the pancreatic head. Findings highly suspicious for IPMN. Recommend GI consultation and hilar follow-up MR imaging of the abdomen without and with contrast or endoscopic ultrasound and potential biopsy. 3. New marked common bile duct dilatation likely due to the pancreatic head lesion. 4. Status post partial pancreatectomy and splenectomy. 5. Advanced  atherosclerotic calcifications involving the aorta and branch vessels. 6. Aortic atherosclerosis. Aortic Atherosclerosis (ICD10-I70.0). Electronically Signed   By: Marijo Sanes M.D.   On: 02/14/2021 10:25    EKG: Independently reviewed.   Assessment/Plan Principal Problem:   Diverticulitis of intestine with abscess    Diverticulitis w/ abscess: 3.4 cm as per CT. Continue on IV zosyn. NPO. Continue on IVFs. General surg consulted  Mass of head of pancreas: w/ common bile duct dilatation as per CT. Hx of pancreatic cancer dx in 1980 & s/p pancreatectomy (80% was removed) & splenectomy. Unknown type of pancreatic cancer. GI consulted  DM2: likely poorly controlled. Weaned off of insulin drip and started on sq insulin. NPO. Continue on IVFs. Continue on accuchecks q4H while pt is NPO  Hyponatremia: likely acute and likely secondary to poor po intake. Continue on IVFs. Sodium Q6 hrs  Thrombocytosis: etiology unclear. Will continue to monitor   Peripheral neuropathy: will restart home dose of gabapentin when no longer NPO  Depression: severity unknown. Will restart home dose of citalopram when no longer NPO   RLS: continue on home dose of ropinirole when no longer NPO  HLD: will hold home dose of statin while NPO    DVT prophylaxis: SCDs only (for possible procedure) Code Status: full  Family Communication: called pt's daughter, [name], and no answer and unable to leave a voicemail  Disposition Plan:  unclear at this time  Consults called: GI, Dr. Vicente Males. General surg, Dr. Dahlia Byes  Admission status: inpatient, progressive    Wyvonnia Dusky MD [facility]   If 7PM-7AM, please contact night-coverage   02/14/2021, 1:37 PM

## 2021-02-14 NOTE — ED Notes (Signed)
Pt still in MRI at this time

## 2021-02-14 NOTE — ED Notes (Signed)
Pt returned from MRI °

## 2021-02-14 NOTE — Consult Note (Signed)
[name] , MD [address] [place], [place], [place], [address] [id number], [place], [place], [place], [address] Phone: [phone]  Fax: [phone]  Consultation  Referring Provider:    Dr Dortha Schwalbe Primary Care Physician:  [facility] Primary Gastroenterologist:  Dr. Cephas Darby         Reason for Consultation:     Pancreatic cyst/mass  Date of Admission:  02/14/2021 Date of Consultation:  02/14/2021         HPI:   Brittany [name] is a 61 y.o. female who follows with [facility] and Dr. Cephas Darby.  Patient has had an EUS on 03/06/2019 which noted that the pancreatic duct was dilated measuring 6 mm in diameter.  The appearance of the duct within the region of the head was branched and a septated lesion suggestive of a cyst was identified in the pancreatic head measuring 27 mm x 10 mm, there were few compartments [place] septated with no associated mass.  Notably.  Needle aspiration was performed.  Common bile duct was 3.4 mm.  Plan was for repeat EUS in 6 months.  She has a history of a distal pancreatectomy 2005.  Admitted in the hospital between October 31 and November 4 for complicated diverticulitis with associated abscess or microperforation and underwent IR drainage.  Was prescribed antibiotics for 2 weeks.  At that time she is also DKA.  Admitted at [facility] between #13 to November 15 for altered mental status.  Multiple ER visits at that time she was having diarrhea.  Plan was for C. difficile testing but I cannot see any results.  Today she comes into the ER not feeling well for past few weeks.  Worsening of hyperglycemia in the ER underwent a CT abdomen and pelvis with contrast and no intrahepatic biliary dilation is noted.  Mild common bile duct dilation new since prior study measuring 15 mm in the head.  No obvious stone.  Pancreas showed that the body and tail have been resected with the head still in place and showed a large complex multiseptated  cystic lesion measuring 4.5 x 3 cm.  Enlarged compared to last EUS.  Suspicious for IPMN.  Severe inflammation in the distal descending colon of her sigmoid colon with marked wall thickening with mucosal enhancement likely diverticulitis and a small abscess measuring 3.4 cm is adjacent to the colon.  Very limited history from the patient.  She is very drowsy.  Denies any abdominal pain at present.  She says she has nausea but no vomiting.  No other complaints at this point of time.  Past Medical History:  Diagnosis Date   Cancer ([place])    Collagen vascular disease ([place])    Diabetes mellitus without complication ([place])    Hypertension    Lupus ([place])     Past Surgical History:  Procedure Laterality Date   PANCREATECTOMY     spleenectomy      Prior to Admission medications   Medication Sig Start Date End Date Taking? Authorizing Provider  insulin glargine (LANTUS SOLOSTAR) 100 UNIT/ML Solostar Pen Inject 40 Units into the skin at bedtime. 01/11/21 01/11/22 Yes [provider]  potassium chloride SA (KLOR-CON M) 20 MEQ tablet Take 2 tablets by mouth daily. 01/09/21 01/09/22 Yes [provider]  aspirin 81 MG chewable tablet Chew 1 tablet (81 mg total) by mouth daily. Patient not taking: Reported on 12/25/2020 07/20/19   Sharen Hones, MD  atorvastatin (LIPITOR) 80 MG tablet Take 1  tablet (80 mg total) by mouth Nightly. 11/08/20 12/25/20  Pokhrel, Corrie Mckusick, MD  citalopram (CELEXA) 10 MG tablet Take 1 tablet (10 mg total) by mouth daily. 12/27/20   Loletha Grayer, MD  diclofenac Sodium (VOLTAREN) 1 % GEL Apply 2 g topically 4 (four) times daily. 01/09/21   [provider]  feeding supplement, GLUCERNA SHAKE, (GLUCERNA SHAKE) LIQD Take 237 mLs by mouth 3 (three) times daily between meals. 12/27/20   Loletha Grayer, MD  fluconazole (DIFLUCAN) 200 MG tablet Take 400 mg by mouth daily. Patient not taking: Reported on 02/14/2021 12/17/20   [provider]   folic acid (FOLVITE) 1 MG tablet Take 1 tablet (1 mg total) by mouth daily. 12/27/20   Loletha Grayer, MD  gabapentin (NEURONTIN) 600 MG tablet Take 1 tablet (600 mg total) by mouth at bedtime. 08/04/19   Love, Ivan Anchors, PA-C  hydrOXYzine (ATARAX/VISTARIL) 10 MG tablet Take 10 mg by mouth 2 (two) times daily.  12/31/16   [provider]  insulin NPH-regular Human (NOVOLIN 70/30) (70-30) 100 UNIT/ML injection Inject 28 Units into the skin 2 (two) times daily with a meal. 08/04/19   Love, Ivan Anchors, PA-C  levofloxacin (LEVAQUIN) 750 MG tablet Take 750 mg by mouth daily. Patient not taking: Reported on 02/14/2021 12/17/20   [provider]  Menthol-Methyl Salicylate (MUSCLE RUB) 10-15 % CREA Apply 1 application topically 2 (two) times daily as needed for muscle pain. 08/04/19   Love, Ivan Anchors, PA-C  metroNIDAZOLE (FLAGYL) 500 MG tablet Take 500 mg by mouth every 12 (twelve) hours. Patient not taking: Reported on 02/14/2021 12/17/20   [provider]  mirtazapine (REMERON) 15 MG tablet Take 1 tablet (15 mg total) by mouth at bedtime. 11/08/20 11/08/21  Pokhrel, Corrie Mckusick, MD  Multiple Vitamin (MULTIVITAMIN WITH MINERALS) TABS tablet Take 1 tablet by mouth daily. Patient not taking: Reported on 12/25/2020 08/05/19   Love, Ivan Anchors, PA-C  ondansetron (ZOFRAN ODT) 4 MG disintegrating tablet Take 1 tablet (4 mg total) by mouth every 8 (eight) hours as needed. 12/27/20   Loletha Grayer, MD  Pancrelipase, Lip-Prot-Amyl, 24000-76000 units CPEP Take 3 capsules (72,000 Units total) by mouth 3 (three) times daily. 12/27/20   Loletha Grayer, MD  rOPINIRole (REQUIP) 1 MG tablet Take 1 tablet (1 mg total) by mouth 2 (two) times daily. 08/04/19   Bary Leriche, PA-C    Family History  Problem Relation Age of Onset   Anxiety disorder Sister    Breast cancer Maternal Aunt      Social History   Tobacco Use   Smoking status: Every Day   Smokeless tobacco: Never  Substance Use Topics    Alcohol use: Yes   Drug use: Not Currently    Allergies as of 02/13/2021 - Review Complete 02/13/2021  Allergen Reaction Noted   Cephalosporins Itching 10/29/2017   Hydromorphone Itching 10/29/2017   Keflin [cephalothin] Itching 10/29/2017    Review of Systems:    All systems reviewed and negative except where noted in HPI.   Physical Exam:  Vital signs in last 24 hours: Temp:  [97.6 F (36.4 C)-98.7 F (37.1 C)] 98.7 F (37.1 C) (01/03 1340) Pulse Rate:  [76-107] 79 (01/03 1340) Resp:  [16-19] 16 (01/03 1340) BP: (127-172)/(74-101) 172/101 (01/03 1340) SpO2:  [97 %-100 %] 97 % (01/03 1340) Weight:  [40.8 kg] 40.8 kg (01/02 2031)   General: Very drowsy but appears comfortable Head:  Normocephalic and atraumatic. Eyes:   No icterus.   Conjunctiva  pink. PERRLA. Ears:  Normal auditory acuity. Neck:  Supple; no masses or thyroidomegaly Lungs: Respirations even and unlabored. Lungs clear to auscultation bilaterally.   No wheezes, crackles, or rhonchi.  Heart:  Regular rate and rhythm;  Without murmur, clicks, rubs or gallops Abdomen:  Soft, nondistended, nontender. Normal bowel sounds. No appreciable masses or hepatomegaly.  No rebound or guarding.  Neurologic:  Alert and oriented x3; drowsy Skin:  Intact without significant lesions or rashes. Cervical Nodes:  No significant cervical adenopathy. Psych: Limited exam arousable  LAB RESULTS: Recent Labs    02/13/21 2036  WBC 9.7  HGB 13.3  HCT 40.6  PLT 469*   BMET Recent Labs    02/13/21 2036 02/14/21 0927  NA 127* 123*  K 4.4 4.2  CL 88* 85*  CO2 22 23  GLUCOSE 467* 594*  BUN 11 14  CREATININE 0.69 0.78  CALCIUM 8.5* 8.4*   LFT No results for input(s): PROT, ALBUMIN, AST, ALT, ALKPHOS, BILITOT, BILIDIR, IBILI in the last 72 hours. PT/INR No results for input(s): LABPROT, INR in the last 72 hours.  STUDIES: DG Chest 2 View  Result Date: 02/13/2021 CLINICAL DATA:  Body aches and loss of appetite. EXAM:  CHEST - 2 VIEW COMPARISON:  December 25, 2020 FINDINGS: The heart size and mediastinal contours are within normal limits. Both lungs are clear. Radiopaque surgical clips are seen within the left upper quadrant. The visualized skeletal structures are unremarkable. IMPRESSION: No active cardiopulmonary disease. Electronically Signed   By: Virgina Norfolk M.D.   On: 02/13/2021 23:58   CT ABDOMEN PELVIS W CONTRAST  Result Date: 02/14/2021 CLINICAL DATA:  Abdominal pain and body aches. Loss of appetite and generalized weakness. EXAM: CT ABDOMEN AND PELVIS WITH CONTRAST TECHNIQUE: Multidetector CT imaging of the abdomen and pelvis was performed using the standard protocol following bolus administration of intravenous contrast. CONTRAST:  77mL OMNIPAQUE IOHEXOL 300 MG/ML  SOLN COMPARISON:  03/16/2019 FINDINGS: Lower chest: The lung bases are clear of acute process. No pleural effusion or pulmonary lesions. The heart is normal in size. No pericardial effusion. The distal esophagus and aorta are unremarkable. Hepatobiliary: No hepatic lesions or intrahepatic biliary dilatation. The gallbladder is unremarkable. Marked common bile duct dilatation, new since prior study measuring up to 15 mm in the head of the pancreas. No obvious common bile duct stone. Pancreas: Surgical changes related to a remote partial pancreatectomy. The body and tail of been resected. The head is still present. There is a large complex multi septated cystic lesion in the pancreatic head measuring a maximum of 4.5 x 3.0 cm. This has enlarged since the prior CT scan where it measured approximately 2.5 x 1.1 cm. Findings highly suspicious for IPMN. Recommend follow-up MR imaging of the abdomen without and with contrast or endoscopic ultrasound and potential biopsy. Spleen: Surgically absent. Adrenals/Urinary Tract: Adrenal glands and kidneys are unremarkable. No renal lesions or hydronephrosis. The bladder is unremarkable. Stomach/Bowel: The stomach,  duodenum and small bowel are grossly normal without oral contrast. No mass lesions or obstructive findings. There is a severe inflammatory process involving the distal descending colon and upper sigmoid colon with marked wall thickening and mucosal and serosal enhancement. Although this could be an inflammatory or infectious colitis think it is most likely diverticulitis given the severe underlying diverticulosis. There appears to be a small adjacent abscess measuring 3.4 cm on image 61/2. Vascular/Lymphatic: The aorta demonstrates advanced atherosclerotic calcifications but no aneurysm dissection. Branch vessel calcifications are also noted. The  major venous structures are grossly patent. No mesenteric or retroperitoneal adenopathy. Reproductive: The uterus and ovaries are unremarkable. Other: No free pelvic fluid collections. No inguinal mass or adenopathy. Musculoskeletal: No significant bony findings. IMPRESSION: 1. Severe inflammatory or infectious colitis involving the distal descending colon and upper sigmoid colon, most likely diverticulitis. There is a small adjacent abscess measuring 3.4 cm. 2. Enlarging complex multi septated cystic lesion in the pancreatic head. Findings highly suspicious for IPMN. Recommend GI consultation and hilar follow-up MR imaging of the abdomen without and with contrast or endoscopic ultrasound and potential biopsy. 3. New marked common bile duct dilatation likely due to the pancreatic head lesion. 4. Status post partial pancreatectomy and splenectomy. 5. Advanced atherosclerotic calcifications involving the aorta and branch vessels. 6. Aortic atherosclerosis. Aortic Atherosclerosis (ICD10-I70.0). Electronically Signed   By: Marijo Sanes M.D.   On: 02/14/2021 10:25      Impression / Plan:   Brittany [name] is a 61 y.o. y/o female with with a history of pancreatectomy and splenectomy in 2005.  Unclear of the nature of the underlying lesion.  Subsequently has had a pancreatic  cyst which has been followed by Dr. Cephas Darby at [facility].  Had an EUS in 2021 and the plan at that point of time was to repeat it in 6 months time.  Comes into the hospital generally feeling unwell.  And on CT abdomen noted to have dilation of the common bile duct as well as a mass in the head which appears to be a complex cystic lesion and large in size compared to what was seen previously.  No LFTs available on admission yet.  Also has been having diarrhea and stool studies have been ordered.  Other findings on CT scan shows diverticulitis of the left colon which she was admitted to the hospital in October 2022.  At that time was treated conservatively for an abscess with antibiotics.  At this point of time this seems to be diverticulitis along with an abscess as well.   Plan 1.  Follow-up LFTs.  Suggest to obtain MRCP.  If there is obstruction would require an ERCP  2.  Consider IV antibiotics for diverticulitis with abscess and surgical evaluation.  3.  If ERCP shows no obstruction then can follow-up with Dr. Cephas Darby as an outpatient for evaluation of the complex cyst of the pancreatic head which would require repeat EUS  Thank you for involving me in the care of this patient.      LOS: 0 days   [name], MD  02/14/2021, 3:17 PM

## 2021-02-14 NOTE — ED Notes (Signed)
Pt resting comfortably in bed at this time. Pt is alert and oriented with even and regular respirations. No acute distress noted. Pt denies any needs at this time. Call light within reach.  °

## 2021-02-14 NOTE — Consult Note (Signed)
[facility] SURGICAL CONSULTATION NOTE (initial) - cpt: 68032   HISTORY OF PRESENT ILLNESS (HPI):  61 y.o. female presented to [facility] ED yesterday evening for evaluation of hyperglycemia and weakness. Patient reports that over the last several weeks she just hasn't "felt well." She described this as progressive generalized fatigue, weakness, worsening hyperglycemia, polydipsia, body aches, abdominal pain, decreased appetite, nausea, emesis, and diarrhea. She has not been able to tolerate PO intake. When asks to localize her abdominal pain she is unable. She is not the best historian. She is very tearful and just reiterates that "she has not felt well." She is not able to provide much additional history. On chart review, she does has a surgical history positive for pancreatectomy and splenectomy in 2005 it appears. Additionally, she has been followed at [facility] for known pancreatic cystic changes. She had EUS in January 2021 at [facility] with Dr Cephas Darby. It does appear biopsies were taken but I can not find results in [place]. Work up in the ED revealed a normal WBC at 9.7K, hyperglycemia to 467 (now >600) with anion gap to 17, beta-hydroxybutyric acid level at 5.79, hyponatremia to 127 (now 123). CT Abdomen/Pelvis was obtained and concerning for infectious colitis vs diverticulitis with small abscess as well as cystic pancreatic head mass. She was admitted to the medicine service for DKA as well as diverticulitis vs colitis. She was started on Zosyn.   Surgery is consulted by hospitalist physician Dr. Eppie Gibson, MD in this context for evaluation and management of diverticulitis/colitis with abscess.  PAST MEDICAL HISTORY (PMH):  Past Medical History:  Diagnosis Date   Cancer ([place])    Collagen vascular disease ([place])    Diabetes mellitus without complication ([place])    Hypertension    Lupus ([place])      PAST SURGICAL HISTORY ([place]):  Past Surgical History:  Procedure Laterality  Date   PANCREATECTOMY     spleenectomy       MEDICATIONS:  Prior to Admission medications   Medication Sig Start Date End Date Taking? Authorizing Provider  insulin glargine (LANTUS SOLOSTAR) 100 UNIT/ML Solostar Pen Inject 40 Units into the skin at bedtime. 01/11/21 01/11/22 Yes [provider]  potassium chloride SA (KLOR-CON M) 20 MEQ tablet Take 2 tablets by mouth daily. 01/09/21 01/09/22 Yes [provider]  aspirin 81 MG chewable tablet Chew 1 tablet (81 mg total) by mouth daily. Patient not taking: Reported on 12/25/2020 07/20/19   Sharen Hones, MD  atorvastatin (LIPITOR) 80 MG tablet Take 1 tablet (80 mg total) by mouth Nightly. 11/08/20 12/25/20  Pokhrel, Corrie Mckusick, MD  citalopram (CELEXA) 10 MG tablet Take 1 tablet (10 mg total) by mouth daily. 12/27/20   Loletha Grayer, MD  diclofenac Sodium (VOLTAREN) 1 % GEL Apply 2 g topically 4 (four) times daily. 01/09/21   [provider]  feeding supplement, GLUCERNA SHAKE, (GLUCERNA SHAKE) LIQD Take 237 mLs by mouth 3 (three) times daily between meals. 12/27/20   Loletha Grayer, MD  fluconazole (DIFLUCAN) 200 MG tablet Take 400 mg by mouth daily. Patient not taking: Reported on 02/14/2021 12/17/20   [provider]  folic acid (FOLVITE) 1 MG tablet Take 1 tablet (1 mg total) by mouth daily. 12/27/20   Loletha Grayer, MD  gabapentin (NEURONTIN) 600 MG tablet Take 1 tablet (600 mg total) by mouth at bedtime. 08/04/19   Love, Ivan Anchors, PA-C  hydrOXYzine (ATARAX/VISTARIL) 10 MG tablet Take 10 mg by mouth 2 (two) times daily.  12/31/16  [provider]  insulin NPH-regular Human (NOVOLIN 70/30) (70-30) 100 UNIT/ML injection Inject 28 Units into the skin 2 (two) times daily with a meal. 08/04/19   Love, Ivan Anchors, PA-C  levofloxacin (LEVAQUIN) 750 MG tablet Take 750 mg by mouth daily. Patient not taking: Reported on 02/14/2021 12/17/20   [provider]  Menthol-Methyl Salicylate (MUSCLE RUB) 10-15 %  CREA Apply 1 application topically 2 (two) times daily as needed for muscle pain. 08/04/19   Love, Ivan Anchors, PA-C  metroNIDAZOLE (FLAGYL) 500 MG tablet Take 500 mg by mouth every 12 (twelve) hours. Patient not taking: Reported on 02/14/2021 12/17/20   [provider]  mirtazapine (REMERON) 15 MG tablet Take 1 tablet (15 mg total) by mouth at bedtime. 11/08/20 11/08/21  Pokhrel, Corrie Mckusick, MD  Multiple Vitamin (MULTIVITAMIN WITH MINERALS) TABS tablet Take 1 tablet by mouth daily. Patient not taking: Reported on 12/25/2020 08/05/19   Love, Ivan Anchors, PA-C  ondansetron (ZOFRAN ODT) 4 MG disintegrating tablet Take 1 tablet (4 mg total) by mouth every 8 (eight) hours as needed. 12/27/20   Loletha Grayer, MD  Pancrelipase, Lip-Prot-Amyl, 24000-76000 units CPEP Take 3 capsules (72,000 Units total) by mouth 3 (three) times daily. 12/27/20   Loletha Grayer, MD  rOPINIRole (REQUIP) 1 MG tablet Take 1 tablet (1 mg total) by mouth 2 (two) times daily. 08/04/19   Bary Leriche, PA-C     ALLERGIES:  Allergies  Allergen Reactions   Cephalosporins Itching   Hydromorphone Itching   Keflin [Cephalothin] Itching     SOCIAL HISTORY:  Social History   Socioeconomic History   Marital status: Widowed    Spouse name: Not on file   Number of children: Not on file   Years of education: Not on file   Highest education level: Not on file  Occupational History   Not on file  Tobacco Use   Smoking status: Every Day   Smokeless tobacco: Never  Substance and Sexual Activity   Alcohol use: Yes   Drug use: Not Currently   Sexual activity: Not on file  Other Topics Concern   Not on file  Social History Narrative   Not on file   Social Determinants of Health   Financial Resource Strain: Not on file  Food Insecurity: Not on file  Transportation Needs: Not on file  Physical Activity: Not on file  Stress: Not on file  Social Connections: Not on file  Intimate Partner Violence: Not on file     FAMILY  HISTORY:  Family History  Problem Relation Age of Onset   Anxiety disorder Sister    Breast cancer Maternal Aunt       REVIEW OF SYSTEMS:  Review of Systems  Constitutional:  Positive for malaise/fatigue. Negative for chills, fever and weight loss.  HENT:  Negative for congestion and sore throat.   Respiratory:  Negative for cough and shortness of breath.   Cardiovascular:  Negative for chest pain and palpitations.  Gastrointestinal:  Positive for abdominal pain, diarrhea, nausea and vomiting.  Genitourinary:  Negative for dysuria and urgency.  Musculoskeletal:  Positive for myalgias. Negative for falls.  Endo/Heme/Allergies:  Positive for polydipsia.  All other systems reviewed and are negative.  VITAL SIGNS:  Temp:  [97.6 F (36.4 C)-98.5 F (36.9 C)] 97.6 F (36.4 C) (01/03 0735) Pulse Rate:  [76-107] 76 (01/03 0923) Resp:  [16-19] 16 (01/03 0923) BP: (127-148)/(74-93) 148/74 (01/03 0923) SpO2:  [97 %-100 %] 98 % (01/03 0923) Weight:  [40.8  kg] 40.8 kg (01/02 2031)     Height: 5' (152.4 cm) Weight: 40.8 kg BMI (Calculated): 17.58   INTAKE/OUTPUT:  No intake/output data recorded.  PHYSICAL EXAM:  Physical Exam Vitals and nursing note reviewed. Exam conducted with a chaperone present.  Constitutional:      General: She is not in acute distress.    Appearance: She is normal weight.     Comments: Patient resting in bed, she intermittently becomes tearful, somnolent but arouses appropriately  HENT:     Head: Normocephalic and atraumatic.  Eyes:     Conjunctiva/sclera: Conjunctivae normal.     Pupils: Pupils are equal, round, and reactive to light.  Cardiovascular:     Rate and Rhythm: Normal rate.     Pulses: Normal pulses.     Heart sounds: No murmur heard. Pulmonary:     Effort: Pulmonary effort is normal. No respiratory distress.  Abdominal:     General: Abdomen is flat. A surgical scar is present. There is no distension.     Tenderness: There is generalized  abdominal tenderness. There is no guarding or rebound.     Comments: Abdomen is soft, difficult to localize tenderness, non-distended, no rebound/guarding, she is without evidence of peritonitis currently   Genitourinary:    Comments: Deferred Musculoskeletal:     Right lower leg: No edema.     Left lower leg: No edema.  Skin:    General: Skin is warm and dry.  Neurological:     General: No focal deficit present.     Mental Status: She is alert. Mental status is at baseline.  Psychiatric:        Attention and Perception: Attention normal.        Mood and Affect: Affect is tearful.        Behavior: Behavior normal.     Labs:  CBC Latest Ref Rng & Units 02/13/2021 12/27/2020 12/26/2020  WBC 4.0 - 10.5 K/uL 9.7 9.8 12.9(H)  Hemoglobin 12.0 - 15.0 g/dL 13.3 10.7(L) 11.5(L)  Hematocrit 36.0 - 46.0 % 40.6 32.5(L) 33.7(L)  Platelets 150 - 400 K/uL 469(H) 419(H) 512(H)   CMP Latest Ref Rng & Units 02/14/2021 02/13/2021 12/27/2020  Glucose 70 - 99 mg/dL 594(HH) 467(H) 381(H)  BUN 8 - 23 mg/dL 14 11 8   Creatinine 0.44 - 1.00 mg/dL 0.78 0.69 0.56  Sodium 135 - 145 mmol/L 123(L) 127(L) 134(L)  Potassium 3.5 - 5.1 mmol/L 4.2 4.4 3.7  Chloride 98 - 111 mmol/L 85(L) 88(L) 100  CO2 22 - 32 mmol/L 23 22 29   Calcium 8.9 - 10.3 mg/dL 8.4(L) 8.5(L) 8.5(L)  Total Protein 6.5 - 8.1 g/dL - - -  Total Bilirubin 0.3 - 1.2 mg/dL - - -  Alkaline Phos 38 - 126 U/L - - -  AST 15 - 41 U/L - - -  ALT 0 - 44 U/L - - -     Imaging studies:   CT Abdomen/Pelvis (02/14/2021) personally reviewed showing inflammatory changes to the sigmoid and descending colon with small abscess (~3 cm) concerning for diverticulitis vs infectious colitis, also pancreatic head mass see, cystic changes seen, and radiologist report reviewed below:  IMPRESSION: 1. Severe inflammatory or infectious colitis involving the distal descending colon and upper sigmoid colon, most likely diverticulitis. There is a small adjacent abscess  measuring 3.4 cm. 2. Enlarging complex multi septated cystic lesion in the pancreatic head. Findings highly suspicious for IPMN. Recommend GI consultation and hilar follow-up MR imaging of the abdomen without and  with contrast or endoscopic ultrasound and potential biopsy. 3. New marked common bile duct dilatation likely due to the pancreatic head lesion. 4. Status post partial pancreatectomy and splenectomy. 5. Advanced atherosclerotic calcifications involving the aorta and branch vessels. 6. Aortic atherosclerosis.   Assessment/Plan: (ICD-10's: K47.92) 61 y.o. female with generalized fatigue, body aches, polydipsia, abdominal pain, and diarrhea found to have DKA and likely colitis/diverticulitis and small abscess without pneumoperitoneum.   - Appreciate medicine admission - Will discuss with interventional radiology regarding feasibility of drainage of her intra-abdominal abscess - Will manage conservatively for now; If she were to fail conservative measures or clinically deteriorate, we would need to proceed with surgical intervention and likely temporizing colostomy    - Recommend NPO for now + IVF resuscitation - Continue IV Abx (Zosyn) - Monitor abdominal examination; on-going bowel function - Pain control prn; antiemetics prn - Monitor glycemic levels - Agree with adding on hepatic panel/lipase - Can consider GI consult for pancreatic cystic mass; On chart review it seems she is followed at [facility] for this and had EUS in Jan 2021   - Further management per primary service; we will follow   All of the above findings and recommendations were discussed with the patient, and all of patient's questions were answered to her expressed satisfaction.  Thank you for the opportunity to participate in this patient's care.   -- Edison Simon, PA-C [facility] 02/14/2021, 12:29 PM [phone] M-F: 7am - 4pm

## 2021-02-14 NOTE — ED Notes (Signed)
Patient had brown grainy diarrhea, which she says is not new.  Has had for at least 5 months.  Says the doctor doesn't know why.  Cleaned and new diaper applied.  Bowel sounds are loud and audible to the ear.

## 2021-02-15 ENCOUNTER — Encounter: Payer: Self-pay | Admitting: Internal Medicine

## 2021-02-15 ENCOUNTER — Inpatient Hospital Stay: Payer: Medicare Other

## 2021-02-15 DIAGNOSIS — K5732 Diverticulitis of large intestine without perforation or abscess without bleeding: Secondary | ICD-10-CM | POA: Diagnosis not present

## 2021-02-15 LAB — COMPREHENSIVE METABOLIC PANEL
ALT: 11 U/L (ref 0–44)
AST: 14 U/L — ABNORMAL LOW (ref 15–41)
Albumin: 2.4 g/dL — ABNORMAL LOW (ref 3.5–5.0)
Alkaline Phosphatase: 103 U/L (ref 38–126)
Anion gap: 11 (ref 5–15)
BUN: 6 mg/dL — ABNORMAL LOW (ref 8–23)
CO2: 30 mmol/L (ref 22–32)
Calcium: 8.6 mg/dL — ABNORMAL LOW (ref 8.9–10.3)
Chloride: 92 mmol/L — ABNORMAL LOW (ref 98–111)
Creatinine, Ser: 0.44 mg/dL (ref 0.44–1.00)
GFR, Estimated: >60 mL/min (ref >60)
Glucose, Bld: 106 mg/dL — ABNORMAL HIGH (ref 70–99)
Potassium: 2.6 mmol/L — CL (ref 3.5–5.1)
Sodium: 133 mmol/L — ABNORMAL LOW (ref 135–145)
Total Bilirubin: 0.5 mg/dL (ref 0.3–1.2)
Total Protein: 6.9 g/dL (ref 6.5–8.1)

## 2021-02-15 LAB — BASIC METABOLIC PANEL
Anion gap: 7 (ref 5–15)
BUN: 6 mg/dL — ABNORMAL LOW (ref 8–23)
CO2: 29 mmol/L (ref 22–32)
Calcium: 8.2 mg/dL — ABNORMAL LOW (ref 8.9–10.3)
Chloride: 95 mmol/L — ABNORMAL LOW (ref 98–111)
Creatinine, Ser: 0.38 mg/dL — ABNORMAL LOW (ref 0.44–1.00)
GFR, Estimated: >60 mL/min (ref >60)
Glucose, Bld: 128 mg/dL — ABNORMAL HIGH (ref 70–99)
Potassium: 3.5 mmol/L (ref 3.5–5.1)
Sodium: 131 mmol/L — ABNORMAL LOW (ref 135–145)

## 2021-02-15 LAB — CBG MONITORING, ED
Glucose-Capillary: 129 mg/dL — ABNORMAL HIGH (ref 70–99)
Glucose-Capillary: 122 mg/dL — ABNORMAL HIGH (ref 70–99)
Glucose-Capillary: 163 mg/dL — ABNORMAL HIGH (ref 70–99)
Glucose-Capillary: 127 mg/dL — ABNORMAL HIGH (ref 70–99)
Glucose-Capillary: 312 mg/dL — ABNORMAL HIGH (ref 70–99)

## 2021-02-15 LAB — GLUCOSE, CAPILLARY: Glucose-Capillary: 111 mg/dL — ABNORMAL HIGH (ref 70–99)

## 2021-02-15 LAB — CBC
HCT: 38 % (ref 36.0–46.0)
Hemoglobin: 12.8 g/dL (ref 12.0–15.0)
MCH: 30.1 pg (ref 26.0–34.0)
MCHC: 33.7 g/dL (ref 30.0–36.0)
MCV: 89.4 fL (ref 80.0–100.0)
Platelets: 596 10*3/uL — ABNORMAL HIGH (ref 150–400)
RBC: 4.25 MIL/uL (ref 3.87–5.11)
RDW: 12.8 % (ref 11.5–15.5)
WBC: 10.2 10*3/uL (ref 4.0–10.5)
nRBC: 0 % (ref 0.0–0.2)

## 2021-02-15 LAB — MAGNESIUM: Magnesium: 1.5 mg/dL — ABNORMAL LOW (ref 1.7–2.4)

## 2021-02-15 LAB — LIPASE, BLOOD: Lipase: 239 U/L — ABNORMAL HIGH (ref 11–51)

## 2021-02-15 MED ORDER — POTASSIUM CHLORIDE IN NACL 40-0.9 MEQ/L-% IV SOLN
INTRAVENOUS | Status: DC
  Filled 2021-02-15 (×5): qty 1000

## 2021-02-15 MED ORDER — CITALOPRAM HYDROBROMIDE 20 MG PO TABS
10.0000 mg | ORAL_TABLET | Freq: Every day | ORAL | Status: DC
  Administered 2021-02-15 – 2021-02-16 (×2): 10 mg via ORAL
  Filled 2021-02-15 (×2): qty 1

## 2021-02-15 MED ORDER — PANCRELIPASE (LIP-PROT-AMYL) 12000-38000 UNITS PO CPEP
24000.0000 [IU] | ORAL_CAPSULE | Freq: Three times a day (TID) | ORAL | Status: DC
  Administered 2021-02-15 – 2021-02-17 (×5): 24000 [IU] via ORAL
  Filled 2021-02-15 (×9): qty 2

## 2021-02-15 MED ORDER — ASPIRIN 81 MG PO CHEW
81.0000 mg | CHEWABLE_TABLET | Freq: Every day | ORAL | Status: DC
  Administered 2021-02-15 – 2021-02-17 (×3): 81 mg via ORAL
  Filled 2021-02-15 (×3): qty 1

## 2021-02-15 MED ORDER — ATORVASTATIN CALCIUM 20 MG PO TABS
80.0000 mg | ORAL_TABLET | Freq: Every evening | ORAL | Status: DC
  Administered 2021-02-16 (×2): 80 mg via ORAL
  Filled 2021-02-15 (×2): qty 4

## 2021-02-15 MED ORDER — FENTANYL CITRATE (PF) 100 MCG/2ML IJ SOLN
INTRAMUSCULAR | Status: AC | PRN
Start: 2021-02-15 — End: 2021-02-15
  Administered 2021-02-15: 25 ug via INTRAVENOUS

## 2021-02-15 MED ORDER — ROPINIROLE HCL 1 MG PO TABS
1.0000 mg | ORAL_TABLET | Freq: Two times a day (BID) | ORAL | Status: DC
  Administered 2021-02-15 – 2021-02-16 (×3): 1 mg via ORAL
  Filled 2021-02-15 (×5): qty 1

## 2021-02-15 MED ORDER — MIDAZOLAM HCL 2 MG/2ML IJ SOLN
INTRAMUSCULAR | Status: AC
  Filled 2021-02-15: qty 2

## 2021-02-15 MED ORDER — GABAPENTIN 600 MG PO TABS
600.0000 mg | ORAL_TABLET | Freq: Every day | ORAL | Status: DC
  Administered 2021-02-16: 600 mg via ORAL
  Filled 2021-02-15: qty 1

## 2021-02-15 MED ORDER — FENTANYL CITRATE (PF) 100 MCG/2ML IJ SOLN
INTRAMUSCULAR | Status: AC
  Filled 2021-02-15: qty 2

## 2021-02-15 MED ORDER — MIRTAZAPINE 15 MG PO TABS
15.0000 mg | ORAL_TABLET | Freq: Every day | ORAL | Status: DC
  Administered 2021-02-16 (×2): 15 mg via ORAL
  Filled 2021-02-15 (×2): qty 1

## 2021-02-15 MED ORDER — POTASSIUM CHLORIDE CRYS ER 20 MEQ PO TBCR
40.0000 meq | EXTENDED_RELEASE_TABLET | Freq: Once | ORAL | Status: AC
  Administered 2021-02-15: 40 meq via ORAL
  Filled 2021-02-15: qty 2

## 2021-02-15 MED ORDER — MIDAZOLAM HCL 2 MG/2ML IJ SOLN
INTRAMUSCULAR | Status: AC | PRN
Start: 2021-02-15 — End: 2021-02-15
  Administered 2021-02-15: .5 mg via INTRAVENOUS

## 2021-02-15 NOTE — Consult Note (Addendum)
Chief Complaint: Patient was seen in consultation today for diverticulitis with abscess at the request of Tylene Fantasia, PA-C  Referring Physician(s): Carlus Pavlov  Supervising Physician: Daryll Brod  Patient Status: [facility] - ED  History of Present Illness: Brittany [name] is a 61 y.o. female with PMHx significant for pancreatic cancer s/p pancreatectomy and splenectomy, peripheral neuropathy, lupus, DM, HTN and known pancreatic cystic changes in which she follows at [place] at had EUS in 2021. Patient is a poor historian and history is obtained per chart review. The patient presented to ED 1/2 with complaints of general aches and not feeling well including abdominal pain. CT imaging done revealed Severe inflammatory or infectious colitis involving the distal descending colon and upper sigmoid colon, most likely diverticulitis. There is a small adjacent abscess measuring 3.4 cm. Surgery has been consulted and request for IR evaluation to manage conservatively. Today she admits to ongoing diffuse abdominal tenderness. The patient denies any current chest pain or shortness of breath. She denies any current blood thinner use. She denies any known complications to sedation.    Past Medical History:  Diagnosis Date   Cancer ([place])    Collagen vascular disease ([place])    Diabetes mellitus without complication ([place])    Hypertension    Lupus ([place])     Past Surgical History:  Procedure Laterality Date   PANCREATECTOMY     spleenectomy      Allergies: Cephalosporins, Hydromorphone, and Keflin [cephalothin]  Medications: Prior to Admission medications   Medication Sig Start Date End Date Taking? Authorizing Provider  potassium chloride SA (KLOR-CON M) 20 MEQ tablet Take 2 tablets by mouth daily. 01/09/21 01/09/22 Yes [provider]  aspirin 81 MG chewable tablet Chew 1 tablet (81 mg total) by mouth daily. Patient not taking: Reported on 12/25/2020 07/20/19   Sharen Hones, MD  atorvastatin (LIPITOR) 80 MG tablet Take 1 tablet (80 mg total) by mouth Nightly. 11/08/20 12/25/20  Pokhrel, Corrie Mckusick, MD  citalopram (CELEXA) 10 MG tablet Take 1 tablet (10 mg total) by mouth daily. 12/27/20   Loletha Grayer, MD  diclofenac Sodium (VOLTAREN) 1 % GEL Apply 2 g topically 4 (four) times daily. 01/09/21   [provider]  feeding supplement, GLUCERNA SHAKE, (GLUCERNA SHAKE) LIQD Take 237 mLs by mouth 3 (three) times daily between meals. 12/27/20   Loletha Grayer, MD  fluconazole (DIFLUCAN) 200 MG tablet Take 400 mg by mouth daily. Patient not taking: Reported on 02/14/2021 12/17/20   [provider]  folic acid (FOLVITE) 1 MG tablet Take 1 tablet (1 mg total) by mouth daily. 12/27/20   Loletha Grayer, MD  gabapentin (NEURONTIN) 600 MG tablet Take 1 tablet (600 mg total) by mouth at bedtime. 08/04/19   Love, Ivan Anchors, PA-C  hydrOXYzine (ATARAX/VISTARIL) 10 MG tablet Take 10 mg by mouth 2 (two) times daily.  12/31/16   [provider]  insulin glargine (LANTUS SOLOSTAR) 100 UNIT/ML Solostar Pen Inject 40 Units into the skin at bedtime. Patient not taking: Reported on 02/14/2021 01/11/21 01/11/22  [provider]  insulin NPH-regular Human (NOVOLIN 70/30) (70-30) 100 UNIT/ML injection Inject 28 Units into the skin 2 (two) times daily with a meal. 08/04/19   Love, Ivan Anchors, PA-C  levofloxacin (LEVAQUIN) 750 MG tablet Take 750 mg by mouth daily. Patient not taking: Reported on 02/14/2021 12/17/20   [provider]  Menthol-Methyl Salicylate (MUSCLE RUB) 10-15 % CREA Apply 1 application topically 2 (two) times daily as  needed for muscle pain. Patient not taking: Reported on 02/14/2021 08/04/19   Love, Ivan Anchors, PA-C  metroNIDAZOLE (FLAGYL) 500 MG tablet Take 500 mg by mouth every 12 (twelve) hours. Patient not taking: Reported on 02/14/2021 12/17/20   [provider]  mirtazapine (REMERON) 15 MG tablet Take 1 tablet (15 mg total) by  mouth at bedtime. 11/08/20 11/08/21  Pokhrel, Corrie Mckusick, MD  Multiple Vitamin (MULTIVITAMIN WITH MINERALS) TABS tablet Take 1 tablet by mouth daily. Patient not taking: Reported on 12/25/2020 08/05/19   Love, Ivan Anchors, PA-C  ondansetron (ZOFRAN ODT) 4 MG disintegrating tablet Take 1 tablet (4 mg total) by mouth every 8 (eight) hours as needed. 12/27/20   Loletha Grayer, MD  Pancrelipase, Lip-Prot-Amyl, 24000-76000 units CPEP Take 3 capsules (72,000 Units total) by mouth 3 (three) times daily. 12/27/20   Loletha Grayer, MD  rOPINIRole (REQUIP) 1 MG tablet Take 1 tablet (1 mg total) by mouth 2 (two) times daily. 08/04/19   Bary Leriche, PA-C     Family History  Problem Relation Age of Onset   Anxiety disorder Sister    Breast cancer Maternal Aunt     Social History   Socioeconomic History   Marital status: Widowed    Spouse name: Not on file   Number of children: Not on file   Years of education: Not on file   Highest education level: Not on file  Occupational History   Not on file  Tobacco Use   Smoking status: Every Day   Smokeless tobacco: Never  Substance and Sexual Activity   Alcohol use: Yes   Drug use: Not Currently   Sexual activity: Not on file  Other Topics Concern   Not on file  Social History Narrative   Not on file   Social Determinants of Health   Financial Resource Strain: Not on file  Food Insecurity: Not on file  Transportation Needs: Not on file  Physical Activity: Not on file  Stress: Not on file  Social Connections: Not on file   Review of Systems: A 12 point ROS discussed and pertinent positives are indicated in the HPI above.  All other systems are negative.  Review of Systems  Vital Signs: BP (!) 156/94 (BP Location: Right Arm)    Pulse 96    Temp 98.7 F (37.1 C) (Oral)    Resp 18    Ht 5' (1.524 m)    Wt 90 lb (40.8 kg)    SpO2 97%    BMI 17.58 kg/m   Physical Exam Constitutional:      Comments: Appears tired, does awake and respond to  questions appropriately  Cardiovascular:     Rate and Rhythm: Normal rate and regular rhythm.  Pulmonary:     Effort: Pulmonary effort is normal. No respiratory distress.  Abdominal:     General: There is no distension.     Palpations: Abdomen is soft.     Tenderness: There is abdominal tenderness.  Skin:    General: Skin is warm and dry.  Neurological:     Mental Status: She is alert and oriented to person, place, and time.    Imaging: DG Chest 2 View  Result Date: 02/13/2021 CLINICAL DATA:  Body aches and loss of appetite. EXAM: CHEST - 2 VIEW COMPARISON:  December 25, 2020 FINDINGS: The heart size and mediastinal contours are within normal limits. Both lungs are clear. Radiopaque surgical clips are seen within the left upper quadrant. The visualized skeletal structures are unremarkable.  IMPRESSION: No active cardiopulmonary disease. Electronically Signed   By: Virgina Norfolk M.D.   On: 02/13/2021 23:58   CT ABDOMEN PELVIS W CONTRAST  Result Date: 02/14/2021 CLINICAL DATA:  Abdominal pain and body aches. Loss of appetite and generalized weakness. EXAM: CT ABDOMEN AND PELVIS WITH CONTRAST TECHNIQUE: Multidetector CT imaging of the abdomen and pelvis was performed using the standard protocol following bolus administration of intravenous contrast. CONTRAST:  68mL OMNIPAQUE IOHEXOL 300 MG/ML  SOLN COMPARISON:  03/16/2019 FINDINGS: Lower chest: The lung bases are clear of acute process. No pleural effusion or pulmonary lesions. The heart is normal in size. No pericardial effusion. The distal esophagus and aorta are unremarkable. Hepatobiliary: No hepatic lesions or intrahepatic biliary dilatation. The gallbladder is unremarkable. Marked common bile duct dilatation, new since prior study measuring up to 15 mm in the head of the pancreas. No obvious common bile duct stone. Pancreas: Surgical changes related to a remote partial pancreatectomy. The body and tail of been resected. The head is still  present. There is a large complex multi septated cystic lesion in the pancreatic head measuring a maximum of 4.5 x 3.0 cm. This has enlarged since the prior CT scan where it measured approximately 2.5 x 1.1 cm. Findings highly suspicious for IPMN. Recommend follow-up MR imaging of the abdomen without and with contrast or endoscopic ultrasound and potential biopsy. Spleen: Surgically absent. Adrenals/Urinary Tract: Adrenal glands and kidneys are unremarkable. No renal lesions or hydronephrosis. The bladder is unremarkable. Stomach/Bowel: The stomach, duodenum and small bowel are grossly normal without oral contrast. No mass lesions or obstructive findings. There is a severe inflammatory process involving the distal descending colon and upper sigmoid colon with marked wall thickening and mucosal and serosal enhancement. Although this could be an inflammatory or infectious colitis think it is most likely diverticulitis given the severe underlying diverticulosis. There appears to be a small adjacent abscess measuring 3.4 cm on image 61/2. Vascular/Lymphatic: The aorta demonstrates advanced atherosclerotic calcifications but no aneurysm dissection. Branch vessel calcifications are also noted. The major venous structures are grossly patent. No mesenteric or retroperitoneal adenopathy. Reproductive: The uterus and ovaries are unremarkable. Other: No free pelvic fluid collections. No inguinal mass or adenopathy. Musculoskeletal: No significant bony findings. IMPRESSION: 1. Severe inflammatory or infectious colitis involving the distal descending colon and upper sigmoid colon, most likely diverticulitis. There is a small adjacent abscess measuring 3.4 cm. 2. Enlarging complex multi septated cystic lesion in the pancreatic head. Findings highly suspicious for IPMN. Recommend GI consultation and hilar follow-up MR imaging of the abdomen without and with contrast or endoscopic ultrasound and potential biopsy. 3. New marked  common bile duct dilatation likely due to the pancreatic head lesion. 4. Status post partial pancreatectomy and splenectomy. 5. Advanced atherosclerotic calcifications involving the aorta and branch vessels. 6. Aortic atherosclerosis. Aortic Atherosclerosis (ICD10-I70.0). Electronically Signed   By: Marijo Sanes M.D.   On: 02/14/2021 10:25   MR 3D Recon At Scanner  Result Date: 02/15/2021 CLINICAL DATA:  Pancreatic mass on recent CT scan. EXAM: MRI ABDOMEN WITHOUT AND WITH CONTRAST (INCLUDING MRCP) TECHNIQUE: Multiplanar multisequence MR imaging of the abdomen was performed both before and after the administration of intravenous contrast. Heavily T2-weighted images of the biliary and pancreatic ducts were obtained, and three-dimensional MRCP images were rendered by post processing. CONTRAST:  64mL GADAVIST GADOBUTROL 1 MMOL/ML IV SOLN COMPARISON:  CT scan 02/14/2021 FINDINGS: Lower chest: Unremarkable. Hepatobiliary: No suspicious focal abnormality within the liver parenchyma. There is no  evidence for gallstones, gallbladder wall thickening, or pericholecystic fluid. No intrahepatic or extrahepatic biliary dilation. The common bile duct in the head of the pancreas measures 4-5 mm in diameter on today's exam. The 15 mm measurement obtained on CT scan earlier today actually represents an elongated tubular cystic component to the pancreatic head mass. As noted Pancreas: Multiloculated cystic mass is identified in the head of pancreas/uncinate process measuring 6.4 x 4.7 x 3.4 cm on CT earlier today. This is progressive comparing back to abdomen CT of 03/16/2019 when the lesion measured approximately 4.9 x 2.5 x 1.1 cm. Although postcontrast imaging is motion degraded, there is no appreciable enhancing mural nodularity or overtly suspicious nodular enhancing component. Given the prior surgery, recognition of the main pancreatic duct is difficult but communication of the multicystic lesion to the pancreatic duct  remnant in the head of the pancreas is suspected. Spleen:  Surgically absent. Adrenals/Urinary Tract: Right adrenal gland unremarkable. 12 mm left adrenal nodule is again noted. 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 Kidneys unremarkable. Stomach/Bowel: Stomach is unremarkable. No gastric wall thickening. No evidence of outlet obstruction. Duodenum is normally positioned as is the ligament of Treitz. No small bowel or colonic dilatation within the visualized abdomen. Wall thickening in the sigmoid colon described on earlier CT is visible on the coronal T2 imaging. Vascular/Lymphatic: No abdominal aortic aneurysm. No abdominal lymphadenopathy Other:  No intraperitoneal free fluid. Musculoskeletal: No focal suspicious marrow enhancement within the visualized bony anatomy. IMPRESSION: 1. 6.4 x 4.7 x 3.4 cm multiloculated cystic mass in the head of pancreas/uncinate process. This is progressive comparing back to CT scan of 03/16/2019 when the lesion measured 4.9 x 2.5 x 1.1 cm. No appreciable enhancing mural nodularity or overtly suspicious nodular enhancing component identified. 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 2. No intrahepatic or extrahepatic biliary dilation. 3. 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 4. Wall thickening in the sigmoid colon better characterized on recent CT scan. Electronically Signed   By: Misty Stanley M.D.   On: 02/15/2021 05:32   MR ABDOMEN MRCP W WO CONTAST  Result Date: 02/15/2021 CLINICAL DATA:  Pancreatic mass on recent CT scan. EXAM: MRI ABDOMEN WITHOUT AND WITH CONTRAST (INCLUDING MRCP) TECHNIQUE: Multiplanar multisequence MR  imaging of the abdomen was performed both before and after the administration of intravenous contrast. Heavily T2-weighted images of the biliary and pancreatic ducts were obtained, and three-dimensional MRCP images were rendered by post processing. CONTRAST:  53mL GADAVIST GADOBUTROL 1 MMOL/ML IV SOLN COMPARISON:  CT scan 02/14/2021 FINDINGS: Lower chest: Unremarkable. Hepatobiliary: No suspicious focal abnormality within the liver parenchyma. There is no evidence for gallstones, gallbladder wall thickening, or pericholecystic fluid. No intrahepatic or extrahepatic biliary dilation. The common bile duct in the head of the pancreas measures 4-5 mm in diameter on today's exam. The 15 mm measurement obtained on CT scan earlier today actually represents an elongated tubular cystic component to the pancreatic head mass. As noted Pancreas: Multiloculated cystic mass is identified in the head of pancreas/uncinate process measuring 6.4 x 4.7 x 3.4 cm on CT earlier today. This is progressive comparing back to abdomen CT of 03/16/2019 when the  lesion measured approximately 4.9 x 2.5 x 1.1 cm. Although postcontrast imaging is motion degraded, there is no appreciable enhancing mural nodularity or overtly suspicious nodular enhancing component. Given the prior surgery, recognition of the main pancreatic duct is difficult but communication of the multicystic lesion to the pancreatic duct remnant in the head of the pancreas is suspected. Spleen:  Surgically absent. Adrenals/Urinary Tract: Right adrenal gland unremarkable. 12 mm left adrenal nodule is again noted. 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 Kidneys unremarkable. Stomach/Bowel: Stomach is unremarkable. No gastric wall thickening. No evidence of outlet obstruction. Duodenum is normally positioned as is  the ligament of Treitz. No small bowel or colonic dilatation within the visualized abdomen. Wall thickening in the sigmoid colon described on earlier CT is visible on the coronal T2 imaging. Vascular/Lymphatic: No abdominal aortic aneurysm. No abdominal lymphadenopathy Other:  No intraperitoneal free fluid. Musculoskeletal: No focal suspicious marrow enhancement within the visualized bony anatomy. IMPRESSION: 1. 6.4 x 4.7 x 3.4 cm multiloculated cystic mass in the head of pancreas/uncinate process. This is progressive comparing back to CT scan of 03/16/2019 when the lesion measured 4.9 x 2.5 x 1.1 cm. No appreciable enhancing mural nodularity or overtly suspicious nodular enhancing component identified. 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 2. No intrahepatic or extrahepatic biliary dilation. 3. 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 4. Wall thickening in the sigmoid colon better characterized on recent CT scan. Electronically Signed   By: Misty Stanley M.D.   On: 02/15/2021 05:32    Labs:  CBC: Recent Labs    12/27/20 0549 02/13/21 2036 02/14/21 1338 02/15/21 0648  WBC 9.8 9.7 9.8 10.2  HGB 10.7* 13.3 12.7 12.8  HCT 32.5* 40.6 39.0 38.0  PLT 419* 469* 542* 596*    COAGS: Recent Labs    11/18/20 0440 12/05/20 1650  INR 1.0 1.0  APTT 27 27    BMP: Recent Labs    12/27/20 0549 02/13/21 2036 02/14/21 0927 02/15/21 0648  NA 134* 127* 123* 133*  K 3.7 4.4 4.2 2.6*  CL 100 88* 85* 92*  CO2 29 22 23 30   GLUCOSE 381* 467* 594* 106*  BUN 8 11 14  6*  CALCIUM 8.5* 8.5* 8.4* 8.6*  CREATININE 0.56 0.69 0.78 0.44  GFRNONAA >60 >60 >60 >60    LIVER FUNCTION TESTS: Recent Labs    12/05/20 1650 12/25/20 0407 02/14/21 0927 02/15/21 0648  BILITOT 1.2 0.6 1.2 0.5  AST 16 15 15  14*  ALT 18 10 13 11    ALKPHOS 94 84 117 103  PROT 8.5* 7.4 7.3 6.9  ALBUMIN 3.2* 2.9* 2.6* 2.4*    Assessment and Plan: 61 year old female with PMHx significant for pancreatic cancer s/p pancreatectomy and splenectomy, peripheral neuropathy, lupus, DM, HTN and known pancreatic cystic changes in which she follows at [place] at had EUS in 2021. Patient presented to ED 1/2 with complaints of general aches and not feeling well including abdominal pain. CT imaging done revealed Severe inflammatory or infectious colitis involving the distal descending colon and upper sigmoid colon, most likely diverticulitis. There is a small adjacent abscess measuring 3.4 cm. Surgery has been consulted and  request for IR evaluation to manage conservatively.   The patient has been NPO, no blood thinners taken, imaging, labs and vitals have been reviewed. We will proceed with aspiration only of fluid collection.   Risks and benefits of CT guided abdominal fluid collection aspiration was discussed with the patient and/or patient's family including, but not limited to bleeding, infection, damage to adjacent structures or low yield requiring additional tests.  All of the questions were answered and there is agreement to proceed.  Consent signed and in chart.  Thank you for this interesting consult.  I greatly enjoyed meeting Brittany [name] and look forward to participating in their care.  A copy of this report was sent to the requesting provider on this date.  Electronically Signed: Hedy Jacob, PA-C 02/15/2021, 12:05 PM   I spent a total of 20 Minutes in face to face in clinical consultation, greater than 50% of which was counseling/coordinating care for diverticulitis with abscess.

## 2021-02-15 NOTE — Progress Notes (Signed)
[name] , MD [address] [place], [place], [place], [address] [address], [place], [place], [place], [address] Phone: [phone]  Fax: [phone]   Brittany [name] is being followed for pancreatic cystic lesion day 1 of follow up   Subjective: Denies any abdominal pain    Objective: Vital signs in last 24 hours: Vitals:   02/15/21 0300 02/15/21 0330 02/15/21 0430 02/15/21 0804  BP: (!) 157/94 (!) 148/98 (!) 166/98 (!) 156/94  Pulse: 97 98 92 96  Resp: 15 15 19 18   Temp:    98.7 F (37.1 C)  TempSrc:    Oral  SpO2: 96% 97% 95% 97%  Weight:      Height:       Weight change:   Intake/Output Summary (Last 24 hours) at 02/15/2021 0908 Last data filed at 02/15/2021 0141 Gross per 24 hour  Intake 982.31 ml  Output --  Net 982.31 ml     Exam:  Abdomen: soft, nontender, normal bowel sounds   Lab Results: @LABTEST2 @ Micro Results: Recent Results (from the past 240 hour(s))  Resp Panel by RT-PCR (Flu A&B, Covid) Nasopharyngeal Swab     Status: None   Collection Time: 02/13/21  8:36 PM   Specimen: Nasopharyngeal Swab; Nasopharyngeal(NP) swabs in vial transport medium  Result Value Ref Range Status   SARS Coronavirus 2 by RT PCR NEGATIVE NEGATIVE Final    Comment: (NOTE) SARS-CoV-2 target nucleic acids are NOT DETECTED.  The SARS-CoV-2 RNA is generally detectable in upper respiratory specimens during the acute phase of infection. The lowest concentration of SARS-CoV-2 viral copies this assay can detect is 138 copies/mL. A negative result does not preclude SARS-Cov-2 infection and should not be used as the sole basis for treatment or other patient management decisions. A negative result may occur with  improper specimen collection/handling, submission of specimen other than nasopharyngeal swab, presence of viral mutation(s) within the areas targeted by this assay, and inadequate number of viral copies(<138 copies/mL). A negative result must be combined  with clinical observations, patient history, and epidemiological information. The expected result is Negative.  Fact Sheet for Patients:  EntrepreneurPulse.com.au  Fact Sheet for Healthcare Providers:  IncredibleEmployment.be  This test is no t yet approved or cleared by the [place] FDA and  has been authorized for detection and/or diagnosis of SARS-CoV-2 by FDA under an Emergency Use Authorization (EUA). This EUA will remain  in effect (meaning this test can be used) for the duration of the COVID-19 declaration under Section 564(b)(1) of the Act, 21 U.S.C.section 360bbb-3(b)(1), unless the authorization is terminated  or revoked sooner.       Influenza A by PCR NEGATIVE NEGATIVE Final   Influenza B by PCR NEGATIVE NEGATIVE Final    Comment: (NOTE) The Xpert Xpress SARS-CoV-2/FLU/RSV plus assay is intended as an aid in the diagnosis of influenza from Nasopharyngeal swab specimens and should not be used as a sole basis for treatment. Nasal washings and aspirates are unacceptable for Xpert Xpress SARS-CoV-2/FLU/RSV testing.  Fact Sheet for Patients: EntrepreneurPulse.com.au  Fact Sheet for Healthcare Providers: IncredibleEmployment.be  This test is not yet approved or cleared by the [place] FDA and has been authorized for detection and/or diagnosis of SARS-CoV-2 by FDA under an Emergency Use Authorization (EUA). This EUA will remain in effect (meaning this test can be used) for the duration of the COVID-19 declaration under Section 564(b)(1) of the Act, 21 U.S.C. section 360bbb-3(b)(1), unless the authorization is terminated or revoked.  Performed at [facility], [address] [place], [place] [address]   Blood culture (routine x 2)     Status: None (Preliminary result)   Collection Time: 02/14/21 11:43 AM   Specimen: BLOOD  Result Value Ref Range Status   Specimen  Description BLOOD RIGHT FA  Final   Special Requests   Final    BOTTLES DRAWN AEROBIC AND ANAEROBIC Blood Culture adequate volume   Culture   Final    NO GROWTH < 24 HOURS Performed at [facility], [address] [place], [place] [address]    Report Status PENDING  Incomplete  Blood culture (routine x 2)     Status: None (Preliminary result)   Collection Time: 02/14/21 11:51 AM   Specimen: BLOOD  Result Value Ref Range Status   Specimen Description BLOOD RIGHT [facility]  Final   Special Requests   Final    BOTTLES DRAWN AEROBIC AND ANAEROBIC Blood Culture adequate volume   Culture   Final    NO GROWTH < 24 HOURS Performed at [facility], [address] [place], [place] [address]    Report Status PENDING  Incomplete   Studies/Results: DG Chest 2 View  Result Date: 02/13/2021 CLINICAL DATA:  Body aches and loss of appetite. EXAM: CHEST - 2 VIEW COMPARISON:  December 25, 2020 FINDINGS: The heart size and mediastinal contours are within normal limits. Both lungs are clear. Radiopaque surgical clips are seen within the left upper quadrant. The visualized skeletal structures are unremarkable. IMPRESSION: No active cardiopulmonary disease. Electronically Signed   By: Virgina Norfolk M.D.   On: 02/13/2021 23:58   CT ABDOMEN PELVIS W CONTRAST  Result Date: 02/14/2021 CLINICAL DATA:  Abdominal pain and body aches. Loss of appetite and generalized weakness. EXAM: CT ABDOMEN AND PELVIS WITH CONTRAST TECHNIQUE: Multidetector CT imaging of the abdomen and pelvis was performed using the standard protocol following bolus administration of intravenous contrast. CONTRAST:  70mL OMNIPAQUE IOHEXOL 300 MG/ML  SOLN COMPARISON:  03/16/2019 FINDINGS: Lower chest: The lung bases are clear of acute process. No pleural effusion or pulmonary lesions. The heart is normal in size. No pericardial effusion. The distal esophagus and aorta are unremarkable. Hepatobiliary: No hepatic lesions or  intrahepatic biliary dilatation. The gallbladder is unremarkable. Marked common bile duct dilatation, new since prior study measuring up to 15 mm in the head of the pancreas. No obvious common bile duct stone. Pancreas: Surgical changes related to a remote partial pancreatectomy. The body and tail of been resected. The head is still present. There is a large complex multi septated cystic lesion in the pancreatic head measuring a maximum of 4.5 x 3.0 cm. This has enlarged since the prior CT scan where it measured approximately 2.5 x 1.1 cm. Findings highly suspicious for IPMN. Recommend follow-up MR imaging of the abdomen without and with contrast or endoscopic ultrasound and potential biopsy. Spleen: Surgically absent. Adrenals/Urinary Tract: Adrenal glands and kidneys are unremarkable. No renal lesions or hydronephrosis. The bladder is unremarkable. Stomach/Bowel: The stomach, duodenum and small bowel are grossly normal without oral contrast. No mass lesions or obstructive findings. There is a severe inflammatory process involving the distal descending colon and upper sigmoid colon with marked wall thickening and mucosal and serosal enhancement. Although this could be an inflammatory or infectious colitis think it is most likely diverticulitis given the severe underlying diverticulosis. There appears to be a small adjacent abscess measuring 3.4 cm on image 61/2. Vascular/Lymphatic: The aorta demonstrates advanced atherosclerotic  calcifications but no aneurysm dissection. Branch vessel calcifications are also noted. The major venous structures are grossly patent. No mesenteric or retroperitoneal adenopathy. Reproductive: The uterus and ovaries are unremarkable. Other: No free pelvic fluid collections. No inguinal mass or adenopathy. Musculoskeletal: No significant bony findings. IMPRESSION: 1. Severe inflammatory or infectious colitis involving the distal descending colon and upper sigmoid colon, most likely  diverticulitis. There is a small adjacent abscess measuring 3.4 cm. 2. Enlarging complex multi septated cystic lesion in the pancreatic head. Findings highly suspicious for IPMN. Recommend GI consultation and hilar follow-up MR imaging of the abdomen without and with contrast or endoscopic ultrasound and potential biopsy. 3. New marked common bile duct dilatation likely due to the pancreatic head lesion. 4. Status post partial pancreatectomy and splenectomy. 5. Advanced atherosclerotic calcifications involving the aorta and branch vessels. 6. Aortic atherosclerosis. Aortic Atherosclerosis (ICD10-I70.0). Electronically Signed   By: Marijo Sanes M.D.   On: 02/14/2021 10:25   MR 3D Recon At Scanner  Result Date: 02/15/2021 CLINICAL DATA:  Pancreatic mass on recent CT scan. EXAM: MRI ABDOMEN WITHOUT AND WITH CONTRAST (INCLUDING MRCP) TECHNIQUE: Multiplanar multisequence MR imaging of the abdomen was performed both before and after the administration of intravenous contrast. Heavily T2-weighted images of the biliary and pancreatic ducts were obtained, and three-dimensional MRCP images were rendered by post processing. CONTRAST:  2mL GADAVIST GADOBUTROL 1 MMOL/ML IV SOLN COMPARISON:  CT scan 02/14/2021 FINDINGS: Lower chest: Unremarkable. Hepatobiliary: No suspicious focal abnormality within the liver parenchyma. There is no evidence for gallstones, gallbladder wall thickening, or pericholecystic fluid. No intrahepatic or extrahepatic biliary dilation. The common bile duct in the head of the pancreas measures 4-5 mm in diameter on today's exam. The 15 mm measurement obtained on CT scan earlier today actually represents an elongated tubular cystic component to the pancreatic head mass. As noted Pancreas: Multiloculated cystic mass is identified in the head of pancreas/uncinate process measuring 6.4 x 4.7 x 3.4 cm on CT earlier today. This is progressive comparing back to abdomen CT of 03/16/2019 when the lesion  measured approximately 4.9 x 2.5 x 1.1 cm. Although postcontrast imaging is motion degraded, there is no appreciable enhancing mural nodularity or overtly suspicious nodular enhancing component. Given the prior surgery, recognition of the main pancreatic duct is difficult but communication of the multicystic lesion to the pancreatic duct remnant in the head of the pancreas is suspected. Spleen:  Surgically absent. Adrenals/Urinary Tract: Right adrenal gland unremarkable. 12 mm left adrenal nodule is again noted. 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 Kidneys unremarkable. Stomach/Bowel: Stomach is unremarkable. No gastric wall thickening. No evidence of outlet obstruction. Duodenum is normally positioned as is the ligament of Treitz. No small bowel or colonic dilatation within the visualized abdomen. Wall thickening in the sigmoid colon described on earlier CT is visible on the coronal T2 imaging. Vascular/Lymphatic: No abdominal aortic aneurysm. No abdominal lymphadenopathy Other:  No intraperitoneal free fluid. Musculoskeletal: No focal suspicious marrow enhancement within the visualized bony anatomy. IMPRESSION: 1. 6.4 x 4.7 x 3.4 cm multiloculated cystic mass in the head of pancreas/uncinate process. This is progressive comparing back to CT scan of 03/16/2019 when the lesion measured 4.9 x 2.5 x 1.1 cm. No appreciable enhancing mural nodularity or overtly suspicious nodular enhancing component identified. 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 2. No intrahepatic or extrahepatic biliary dilation. 3. 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 4. Wall thickening in  the sigmoid colon better characterized on recent CT scan. Electronically Signed   By: Misty Stanley M.D.   On: 02/15/2021 05:32   MR ABDOMEN MRCP W WO CONTAST  Result Date: 02/15/2021 CLINICAL DATA:  Pancreatic mass on recent CT scan. EXAM: MRI ABDOMEN WITHOUT AND WITH CONTRAST (INCLUDING MRCP) TECHNIQUE: Multiplanar multisequence MR imaging of the abdomen was performed both before and after the administration of intravenous contrast. Heavily T2-weighted images of the biliary and pancreatic ducts were obtained, and three-dimensional MRCP images were rendered by post processing. CONTRAST:  75mL GADAVIST GADOBUTROL 1 MMOL/ML IV SOLN COMPARISON:  CT scan 02/14/2021 FINDINGS: Lower chest: Unremarkable. Hepatobiliary: No suspicious focal abnormality within the liver parenchyma. There is no evidence for gallstones, gallbladder wall thickening, or pericholecystic fluid. No intrahepatic or extrahepatic biliary dilation. The common bile duct in the head of the pancreas measures 4-5 mm in diameter on today's exam. The 15 mm measurement obtained on CT scan earlier today actually represents an elongated tubular cystic component to the pancreatic head mass. As noted Pancreas: Multiloculated cystic mass is identified in the head of pancreas/uncinate process measuring 6.4 x 4.7 x 3.4 cm on CT earlier today. This is progressive comparing back to abdomen CT of 03/16/2019 when the lesion measured approximately 4.9 x 2.5 x 1.1 cm. Although postcontrast imaging is motion degraded, there is no appreciable enhancing mural nodularity or overtly suspicious nodular enhancing component. Given the prior surgery, recognition of the main pancreatic duct is difficult but communication of the multicystic lesion to the pancreatic duct remnant in the head of the pancreas is suspected. Spleen:  Surgically absent. Adrenals/Urinary Tract: Right adrenal gland unremarkable. 12 mm left adrenal nodule is again noted. 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 Kidneys unremarkable. Stomach/Bowel: Stomach is unremarkable. No gastric wall thickening. No evidence of outlet obstruction. Duodenum is normally positioned as is the ligament of Treitz. No small bowel or colonic dilatation within the visualized abdomen. Wall thickening in the sigmoid colon described on earlier CT is visible on the coronal T2 imaging. Vascular/Lymphatic: No abdominal aortic aneurysm. No abdominal lymphadenopathy Other:  No intraperitoneal free fluid. Musculoskeletal: No focal suspicious marrow enhancement within the visualized bony anatomy. IMPRESSION: 1. 6.4 x 4.7 x 3.4 cm multiloculated cystic mass in the head of pancreas/uncinate process. This is progressive comparing back to CT scan of 03/16/2019 when the lesion measured 4.9 x 2.5 x 1.1 cm. No appreciable enhancing mural nodularity or overtly suspicious nodular enhancing component identified. 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 2. No intrahepatic or extrahepatic biliary dilation. 3. 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 4. Wall thickening in the sigmoid colon better characterized on recent CT scan. Electronically Signed   By: Misty Stanley M.D.   On: 02/15/2021 05:32   Medications: I have reviewed the patient's current medications. Scheduled Meds:  aspirin  81 mg Oral Daily   atorvastatin  80 mg Oral Nightly   citalopram  10 mg Oral Daily   gabapentin  600 mg Oral QHS   insulin aspart  0-15 Units Subcutaneous Q4H   insulin glargine-yfgn  10 Units Subcutaneous Daily   lipase/protease/amylase  24,000 Units Oral TID   mirtazapine  15 mg Oral QHS   rOPINIRole  1 mg Oral BID    sodium chloride flush  3 mL Intravenous Q12H   Continuous Infusions:  dextrose 5% lactated ringers Stopped (02/14/21 1850)   lactated ringers Stopped (02/14/21 1506)   piperacillin-tazobactam (ZOSYN)  IV Stopped (02/15/21 0902)   PRN Meds:.acetaminophen **OR** acetaminophen, bisacodyl, dextrose, hydrALAZINE, morphine injection, ondansetron **OR** ondansetron (ZOFRAN) IV, oxyCODONE   Assessment: Principal Problem:   Diverticulitis of intestine with abscess   TYARA DASSOW is a 61 y.o. y/o female with with a history of pancreatectomy and splenectomy in 2005.  Unclear of the nature of the underlying lesion.  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 Cephas Darby at [facility].  Had an EUS in 2021 and the plan at that point of time was to repeat it in 6 months time.  Comes into the hospital generally feeling unwell.  And on CT abdomen noted to have  a mass in the head of the pancreas which appears to be a complex cystic lesion and large in size compared to what was seen previously on EUS.    Other findings on CT scan shows diverticulitis of the left colon which she was admitted to the hospital in October 2022.  At that time was treated conservatively for an abscess with antibiotics.  At this point of time this seems to be diverticulitis along with an abscess as well.MRCP was performed overnight that shows enlarged pancreatic cystic mass but no intrahepatic or extrahepatic biliary dilation.Total bilirubin is 0.5 indicating no obstruction     Plan 1.  With no evidence of biliary obstruction either biochemically or radiologically there is no indication for ERCP at this point of time.  2.  There is a cystic masslike lesion in the pancreatic head which needs follow-up with an EUS with Dr. Cephas Darby his primary gastroenterologist at [facility] after discharge.  Patient needs to ensure that she follows up.  She has missed her follow-up after last time the EUS was performed in 2021  3.  Complicated  diverticulitis is being followed by general surgery.   I will sign off.  Please call me if any further GI concerns or questions.  We would like to thank you for the opportunity to participate in the care of RENEISHA STILLEY.    LOS: 1 day   [name], MD 02/15/2021, 9:08 AM

## 2021-02-15 NOTE — Procedures (Signed)
Interventional Radiology Procedure Note  Procedure: CT Aspiration only LLQ small abscess    Complications: None  Estimated Blood Loss:  min  Findings: 5cc purulent fld aspirated    Tamera Punt, MD

## 2021-02-15 NOTE — ED Notes (Signed)
Pt resting in bed. Appears to be sleeping at this time. Chest is rising and falling symmetrically. No acute distress noted. Will continue to monitor.   °

## 2021-02-15 NOTE — Progress Notes (Signed)
[facility] SURGICAL PROGRESS NOTE (cpt [phone])  Hospital Day(s): 1.   Interval History: Patient seen and examined, no acute events or new complaints overnight. Patient resting in bed and appears objectively more comfortable. She reports that her abdominal pain has improved significantly. She has mild intermittent nausea. No fever, chills, emesis, or diarrhea. She remains without a leukocytosis with WBC 10.2K,  sCr - 0.44, hypokalemia to 2.6, hyponatremia improving to 133, her LFTs are normal without evidence of biliary obstruction, lipase remains elevated at 239. MRCP overnight again showed cystic lesion on the head of the pancreas, no biliary dilation. She is still pending IR evaluation for aspiration of intra-abdominal fluid collection.   Review of Systems:  Constitutional: denies fever, chills  HEENT: denies cough or congestion  Respiratory: denies any shortness of breath  Cardiovascular: denies chest pain or palpitations  Gastrointestinal: + nausea (mild); denied abdominal pain, emesis  Genitourinary: denies burning with urination or urinary frequency  Vital signs in last 24 hours: [min-max] current  Temp:  [98.7 F (37.1 C)] 98.7 F (37.1 C) (01/03 1340) Pulse Rate:  [76-101] 92 (01/04 0430) Resp:  [15-27] 19 (01/04 0430) BP: (120-172)/(74-104) 166/98 (01/04 0430) SpO2:  [93 %-99 %] 95 % (01/04 0430)     Height: 5' (152.4 cm) Weight: 40.8 kg BMI (Calculated): 17.58   Intake/Output last 2 shifts:  01/03 0701 - 01/04 0700 In: 982.3 [I.V.:940.4; IV Piggyback:41.9] Out: -    Physical Exam:  Constitutional: alert, cooperative and no distress  HENT: normocephalic without obvious abnormality  Eyes: PERRL, EOM's grossly intact and symmetric  Respiratory: breathing non-labored at rest  Cardiovascular: regular rate and sinus rhythm  Gastrointestinal: Abdomen is soft, she does not appear tender this morning, non-distended, no rebound/guarding, she is certainly without  evidence of peritonitis  Musculoskeletal: no edema or wounds, motor and sensation grossly intact, NT    Labs:  CBC Latest Ref Rng & Units 02/15/2021 02/14/2021 02/13/2021  WBC 4.0 - 10.5 K/uL 10.2 9.8 9.7  Hemoglobin 12.0 - 15.0 g/dL 12.8 12.7 13.3  Hematocrit 36.0 - 46.0 % 38.0 39.0 40.6  Platelets 150 - 400 K/uL 596(H) 542(H) 469(H)   CMP Latest Ref Rng & Units 02/14/2021 02/13/2021 12/27/2020  Glucose 70 - 99 mg/dL 594(HH) 467(H) 381(H)  BUN 8 - 23 mg/dL 14 11 8   Creatinine 0.44 - 1.00 mg/dL 0.78 0.69 0.56  Sodium 135 - 145 mmol/L 123(L) 127(L) 134(L)  Potassium 3.5 - 5.1 mmol/L 4.2 4.4 3.7  Chloride 98 - 111 mmol/L 85(L) 88(L) 100  CO2 22 - 32 mmol/L 23 22 29   Calcium 8.9 - 10.3 mg/dL 8.4(L) 8.5(L) 8.5(L)  Total Protein 6.5 - 8.1 g/dL 7.3 - -  Total Bilirubin 0.3 - 1.2 mg/dL 1.2 - -  Alkaline Phos 38 - 126 U/L 117 - -  AST 15 - 41 U/L 15 - -  ALT 0 - 44 U/L 13 - -    Imaging studies:   MRCP (02/15/2020) personally reviewed and agree with radiologist report reviewed below:  IMPRESSION: 1. 6.4 x 4.7 x 3.4 cm multiloculated cystic mass in the head of pancreas/uncinate process. This is progressive comparing back to CT scan of 03/16/2019 when the lesion measured 4.9 x 2.5 x 1.1 cm. No appreciable enhancing mural nodularity or overtly suspicious nodular enhancing component identified. 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 2. No intrahepatic or extrahepatic biliary dilation. 3. 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 4. Wall thickening in the sigmoid colon better characterized on recent CT scan.   Assessment/Plan: (ICD-10's: K43.92) 61 y.o. female with colitis/diverticulitis with abscess without pneumoperitoneum, complicated by DKA, hypokalemia, and known pancreatic cystic lesion (Hx of IPMN).    -  Recommend remain NPO for know; okay to do CLD following IR procedure - Pending IR evaluation for aspiration of intra-abdominal fluid collection - Appreciate GI recommendations; MRCP reviewed for known pancreatic cystic lesion   - No emergent surgical intervention; we will follow closely to ensure she continues to respond to conservative measures   - Continue IV Abx (Zosyn)  - Monitor abdominal examination; on-going bowel function - Pain control prn; antiemetics prn   - Further management per primary service; we will follow   All of the above findings and recommendations were discussed with the patient, and the medical team, and all of patient's questions were answered to her expressed satisfaction.  -- Edison Simon, PA-C [facility] 02/15/2021, 8:01 AM [phone] M-F: 7am - 4pm

## 2021-02-15 NOTE — Progress Notes (Addendum)
PROGRESS NOTE    Brittany [name]  LPF:[id number] DOB: 1960/04/02 DOA: 02/14/2021 PCP: Clinic, [name]  Outpatient Specialists: oncology    Brief Narrative:   From admission h and p 61 y/o F w/ PMH of DM2, pancreatic cancer s/p pancreatectomy (80% removed) & splenectomy in 1980 (unknown type of pancreatic cancer), RLS, HLD, peripheral neuropathy, depression, chronic urinary frequency who presents w/ abd pain and diarrhea x 6 months. The abd pain and diarrhea has become progressively worse over the past week. The abd pain is located at lower right and left side of the abdomen. The pain is full, constant, w/ radiation to the back. Rubbing her belly helps the pain somewhat better and nothing makes the pain worse. The severity is currently 10/10. Pt has 3-4 episodes of diarrhea a day but denies any bloody or black tarry stools. Pt has had colonoscopy before in which polyps were removed that were benign. Pt is unsure of how long ago the colonoscopy was done. Also, pt c/o intermittent nausea. Pt denies any fever, chills, sweating, cough, chest pain, vomiting, dysuria, or constipation.    Assessment & Plan:   Principal Problem:   Diverticulitis of intestine with abscess   # Acute diverticulitis With abscess 3.4 cm on CT. Recent hospitalization for diverticulitis treated with abx. Hemodynamically stable. 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 Followed by onc. EUS in 2021 consistent w/ pancreatic cyst. GI following here. MRCP 1/3 with progression of previously seen multiloculated cystic mass head of pancreas consistent with ipmn. 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  # History CVA - cont stain, aspirin  # History cocaine use - says recently abstinent  # MDD - cont home celexa, mirtazapine  # RLS - home requip, gabapentin    DVT prophylaxis: lovenox Code Status: full Family Communication: daughter updated telephonically 1/4  Level of care: Progressive Status is: Inpatient  Remains inpatient appropriate because: severity of illness        Consultants:  Gi, gen surg  Procedures: none  Antimicrobials:  zosyn    Subjective: This morning mild left sided abd pain, nausea no vomiting  Objective: Vitals:   02/15/21 0300 02/15/21 0330 02/15/21 0430 02/15/21 0804  BP: (!) 157/94 (!) 148/98 (!) 166/98 (!) 156/94  Pulse: 97 98 92 96  Resp: 15 15 19 18   Temp:    98.7 F (37.1 C)  TempSrc:    Oral  SpO2: 96% 97% 95% 97%  Weight:      Height:        Intake/Output Summary (Last 24 hours) at 02/15/2021 0915 Last data filed at 02/15/2021 0141 Gross per 24 hour  Intake 982.31 ml  Output --  Net 982.31 ml   Filed Weights   02/13/21 2031  Weight: 40.8 kg    Examination:  General exam: Appears calm and comfortable  Respiratory system: Clear to auscultation. Respiratory effort normal. Cardiovascular system: S1 & S2 heard, RRR. No JVD, murmurs, rubs, gallops or clicks. No pedal  edema. Gastrointestinal system: Abdomen is nondistended, soft, mild ttp left side. No organomegaly or masses felt. Normal bowel sounds heard. Central nervous system: Alert and oriented. Moves all 4 extremities Extremities: no edema Skin: No rashes, lesions or ulcers Psychiatry: Judgement and insight appear normal. Mood & affect appropriate.     Data Reviewed: I have personally reviewed following labs and imaging studies  CBC: Recent Labs  Lab 02/13/21 2036 02/14/21 1338  02/15/21 0648  WBC 9.7 9.8 10.2  HGB 13.3 12.7 12.8  HCT 40.6 39.0 38.0  MCV 92.5 93.5 89.4  PLT 469* 542* 219*   Basic Metabolic Panel: Recent Labs  Lab 02/13/21 2036 02/14/21 0927 02/15/21 0648  NA 127* 123* 133*  K 4.4 4.2 2.6*  CL 88* 85* 92*  CO2 22 23 30   GLUCOSE 467* 594* 106*  BUN 11 14 6*  CREATININE 0.69 0.78 0.44  CALCIUM 8.5* 8.4* 8.6*   GFR: Estimated Creatinine Clearance: 47.6 mL/min (by C-G formula based on SCr of 0.44 mg/dL). Liver Function Tests: Recent Labs  Lab 02/14/21 0927 02/15/21 0648  AST 15 14*  ALT 13 11  ALKPHOS 117 103  BILITOT 1.2 0.5  PROT 7.3 6.9  ALBUMIN 2.6* 2.4*   Recent Labs  Lab 02/14/21 0927 02/15/21 0648  LIPASE 194* 239*   No results for input(s): AMMONIA in the last 168 hours. Coagulation Profile: No results for input(s): INR, PROTIME in the last 168 hours. Cardiac Enzymes: No results for input(s): CKTOTAL, CKMB, CKMBINDEX, TROPONINI in the last 168 hours. BNP (last 3 results) No results for input(s): PROBNP in the last 8760 hours. HbA1C: No results for input(s): HGBA1C in the last 72 hours. CBG: Recent Labs  Lab 02/14/21 1807 02/14/21 2008 02/14/21 2331 02/15/21 0503 02/15/21 0817  GLUCAP 160* 174* 127* 129* 122*   Lipid Profile: No results for input(s): CHOL, HDL, LDLCALC, TRIG, CHOLHDL, LDLDIRECT in the last 72 hours. Thyroid Function Tests: No results for input(s): TSH, T4TOTAL, FREET4, T3FREE, THYROIDAB in the last 72 hours. Anemia Panel: No results for input(s): VITAMINB12, FOLATE, FERRITIN, TIBC, IRON, RETICCTPCT in the last 72 hours. Urine analysis:    Component Value Date/Time   COLORURINE STRAW (A) 02/14/2021 1647   APPEARANCEUR CLEAR (A) 02/14/2021 1647   APPEARANCEUR Hazy 04/04/2011 1935   LABSPEC 1.016 02/14/2021 1647   LABSPEC >1.060 04/04/2011 1935   PHURINE 5.0 02/14/2021 1647   GLUCOSEU >=500 (A) 02/14/2021 1647   GLUCOSEU 50 mg/dL 04/04/2011 1935   HGBUR NEGATIVE 02/14/2021 1647    BILIRUBINUR NEGATIVE 02/14/2021 1647   BILIRUBINUR Negative 04/04/2011 1935   KETONESUR 5 (A) 02/14/2021 1647   PROTEINUR NEGATIVE 02/14/2021 1647   NITRITE NEGATIVE 02/14/2021 1647   LEUKOCYTESUR NEGATIVE 02/14/2021 1647   LEUKOCYTESUR Negative 04/04/2011 1935   Sepsis Labs: @LABRCNTIP (procalcitonin:4,lacticidven:4)  ) Recent Results (from the past 240 hour(s))  Resp Panel by RT-PCR (Flu A&B, Covid) Nasopharyngeal Swab     Status: None   Collection Time: 02/13/21  8:36 PM   Specimen: Nasopharyngeal Swab; Nasopharyngeal(NP) swabs in vial transport medium  Result Value Ref Range Status   SARS Coronavirus 2 by RT PCR NEGATIVE NEGATIVE Final    Comment: (NOTE) SARS-CoV-2 target nucleic acids are NOT DETECTED.  The SARS-CoV-2 RNA is generally detectable in upper respiratory specimens during the acute phase of infection. The lowest concentration of SARS-CoV-2 viral copies this assay can detect is 138 copies/mL. A negative result does not preclude SARS-Cov-2 infection and should not be used as the sole basis for  treatment or other patient management decisions. A negative result may occur with  improper specimen collection/handling, submission of specimen other than nasopharyngeal swab, presence of viral mutation(s) within the areas targeted by this assay, and inadequate number of viral copies(<138 copies/mL). A negative result must be combined with clinical observations, patient history, and epidemiological information. The expected result is Negative.  Fact Sheet for Patients:  EntrepreneurPulse.com.au  Fact Sheet for Healthcare Providers:  IncredibleEmployment.be  This test is no t yet approved or cleared by the [place] FDA and  has been authorized for detection and/or diagnosis of SARS-CoV-2 by FDA under an Emergency Use Authorization (EUA). This EUA will remain  in effect (meaning this test can be used) for the duration of  the COVID-19 declaration under Section 564(b)(1) of the Act, 21 U.S.C.section 360bbb-3(b)(1), unless the authorization is terminated  or revoked sooner.       Influenza A by PCR NEGATIVE NEGATIVE Final   Influenza B by PCR NEGATIVE NEGATIVE Final    Comment: (NOTE) The Xpert Xpress SARS-CoV-2/FLU/RSV plus assay is intended as an aid in the diagnosis of influenza from Nasopharyngeal swab specimens and should not be used as a sole basis for treatment. Nasal washings and aspirates are unacceptable for Xpert Xpress SARS-CoV-2/FLU/RSV testing.  Fact Sheet for Patients: EntrepreneurPulse.com.au  Fact Sheet for Healthcare Providers: IncredibleEmployment.be  This test is not yet approved or cleared by the [place] FDA and has been authorized for detection and/or diagnosis of SARS-CoV-2 by FDA under an Emergency Use Authorization (EUA). This EUA will remain in effect (meaning this test can be used) for the duration of the COVID-19 declaration under Section 564(b)(1) of the Act, 21 U.S.C. section 360bbb-3(b)(1), unless the authorization is terminated or revoked.  Performed at [facility], [address] [place], [place] [address]   Blood culture (routine x 2)     Status: None (Preliminary result)   Collection Time: 02/14/21 11:43 AM   Specimen: BLOOD  Result Value Ref Range Status   Specimen Description BLOOD RIGHT FA  Final   Special Requests   Final    BOTTLES DRAWN AEROBIC AND ANAEROBIC Blood Culture adequate volume   Culture   Final    NO GROWTH < 24 HOURS Performed at [facility], [address] [place], [place] [address]    Report Status PENDING  Incomplete  Blood culture (routine x 2)     Status: None (Preliminary result)   Collection Time: 02/14/21 11:51 AM   Specimen: BLOOD  Result Value Ref Range Status   Specimen Description BLOOD RIGHT [facility]  Final   Special Requests   Final    BOTTLES DRAWN AEROBIC AND  ANAEROBIC Blood Culture adequate volume   Culture   Final    NO GROWTH < 24 HOURS Performed at [facility], [address] [place],  [address]    Report Status PENDING  Incomplete         Radiology Studies: DG Chest 2 View  Result Date: 02/13/2021 CLINICAL DATA:  Body aches and loss of appetite. EXAM: CHEST - 2 VIEW COMPARISON:  December 25, 2020 FINDINGS: The heart size and mediastinal contours are within normal limits. Both lungs are clear. Radiopaque surgical clips are seen within the left upper quadrant. The visualized skeletal structures are unremarkable. IMPRESSION: No active cardiopulmonary disease. Electronically Signed   By: Virgina Norfolk M.D.   On: 02/13/2021 23:58   CT ABDOMEN PELVIS W CONTRAST  Result Date: 02/14/2021 CLINICAL DATA:  Abdominal pain and body aches. Loss of appetite and generalized weakness. EXAM: CT ABDOMEN AND PELVIS WITH CONTRAST TECHNIQUE: Multidetector CT imaging of the abdomen and pelvis was performed using the standard protocol following bolus administration of intravenous contrast. CONTRAST:  25mL OMNIPAQUE IOHEXOL 300 MG/ML  SOLN COMPARISON:  03/16/2019 FINDINGS: Lower chest: The lung bases are clear of acute process. No pleural effusion or pulmonary lesions. The heart is normal in size. No pericardial effusion. The distal esophagus and aorta are unremarkable. Hepatobiliary: No hepatic lesions or intrahepatic biliary dilatation. The gallbladder is unremarkable. Marked common bile duct dilatation, new since prior study measuring up to 15 mm in the head of the pancreas. No obvious common bile duct stone. Pancreas: Surgical changes related to a remote partial pancreatectomy. The body and tail of been resected. The head is still present. There is a large complex multi septated cystic lesion in the pancreatic head measuring a maximum of 4.5 x 3.0 cm. This has enlarged since the prior CT scan where it measured approximately 2.5 x 1.1 cm. Findings  highly suspicious for IPMN. Recommend follow-up MR imaging of the abdomen without and with contrast or endoscopic ultrasound and potential biopsy. Spleen: Surgically absent. Adrenals/Urinary Tract: Adrenal glands and kidneys are unremarkable. No renal lesions or hydronephrosis. The bladder is unremarkable. Stomach/Bowel: The stomach, duodenum and small bowel are grossly normal without oral contrast. No mass lesions or obstructive findings. There is a severe inflammatory process involving the distal descending colon and upper sigmoid colon with marked wall thickening and mucosal and serosal enhancement. Although this could be an inflammatory or infectious colitis think it is most likely diverticulitis given the severe underlying diverticulosis. There appears to be a small adjacent abscess measuring 3.4 cm on image 61/2. Vascular/Lymphatic: The aorta demonstrates advanced atherosclerotic calcifications but no aneurysm dissection. Branch vessel calcifications are also noted. The major venous structures are grossly patent. No mesenteric or retroperitoneal adenopathy. Reproductive: The uterus and ovaries are unremarkable. Other: No free pelvic fluid collections. No inguinal mass or adenopathy. Musculoskeletal: No significant bony findings. IMPRESSION: 1. Severe inflammatory or infectious colitis involving the distal descending colon and upper sigmoid colon, most likely diverticulitis. There is a small adjacent abscess measuring 3.4 cm. 2. Enlarging complex multi septated cystic lesion in the pancreatic head. Findings highly suspicious for IPMN. Recommend GI consultation and hilar follow-up MR imaging of the abdomen without and with contrast or endoscopic ultrasound and potential biopsy. 3. New marked common bile duct dilatation likely due to the pancreatic head lesion. 4. Status post partial pancreatectomy and splenectomy. 5. Advanced atherosclerotic calcifications involving the aorta and branch vessels. 6. Aortic  atherosclerosis. Aortic Atherosclerosis (ICD10-I70.0). Electronically Signed   By: Marijo Sanes M.D.   On: 02/14/2021 10:25   MR 3D Recon At Scanner  Result Date: 02/15/2021 CLINICAL DATA:  Pancreatic mass on recent CT scan. EXAM: MRI ABDOMEN WITHOUT AND WITH CONTRAST (INCLUDING MRCP) TECHNIQUE: Multiplanar multisequence MR imaging of the abdomen was performed both before and after the administration of intravenous contrast. Heavily T2-weighted images of the biliary and pancreatic ducts were obtained, and three-dimensional MRCP images were rendered by post processing. CONTRAST:  83mL GADAVIST GADOBUTROL 1 MMOL/ML IV SOLN COMPARISON:  CT scan 02/14/2021 FINDINGS: Lower chest: Unremarkable. Hepatobiliary: No suspicious focal abnormality within the liver parenchyma. There is no evidence for gallstones, gallbladder wall thickening, or pericholecystic fluid. No intrahepatic or extrahepatic biliary dilation. The common bile duct in the head of the pancreas measures 4-5 mm in diameter on today's exam.  The 15 mm measurement obtained on CT scan earlier today actually represents an elongated tubular cystic component to the pancreatic head mass. As noted Pancreas: Multiloculated cystic mass is identified in the head of pancreas/uncinate process measuring 6.4 x 4.7 x 3.4 cm on CT earlier today. This is progressive comparing back to abdomen CT of 03/16/2019 when the lesion measured approximately 4.9 x 2.5 x 1.1 cm. Although postcontrast imaging is motion degraded, there is no appreciable enhancing mural nodularity or overtly suspicious nodular enhancing component. Given the prior surgery, recognition of the main pancreatic duct is difficult but communication of the multicystic lesion to the pancreatic duct remnant in the head of the pancreas is suspected. Spleen:  Surgically absent. Adrenals/Urinary Tract: Right adrenal gland unremarkable. 12 mm left adrenal nodule is again noted. 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 Kidneys unremarkable. Stomach/Bowel: Stomach is unremarkable. No gastric wall thickening. No evidence of outlet obstruction. Duodenum is normally positioned as is the ligament of Treitz. No small bowel or colonic dilatation within the visualized abdomen. Wall thickening in the sigmoid colon described on earlier CT is visible on the coronal T2 imaging. Vascular/Lymphatic: No abdominal aortic aneurysm. No abdominal lymphadenopathy Other:  No intraperitoneal free fluid. Musculoskeletal: No focal suspicious marrow enhancement within the visualized bony anatomy. IMPRESSION: 1. 6.4 x 4.7 x 3.4 cm multiloculated cystic mass in the head of pancreas/uncinate process. This is progressive comparing back to CT scan of 03/16/2019 when the lesion measured 4.9 x 2.5 x 1.1 cm. No appreciable enhancing mural nodularity or overtly suspicious nodular enhancing component identified. 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 2. No intrahepatic or extrahepatic biliary dilation. 3. 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 4. Wall thickening in the sigmoid colon better characterized on recent CT scan. Electronically Signed   By: Misty Stanley M.D.   On: 02/15/2021 05:32   MR ABDOMEN MRCP W WO CONTAST  Result Date: 02/15/2021 CLINICAL DATA:  Pancreatic mass on recent CT scan. EXAM: MRI ABDOMEN WITHOUT AND WITH CONTRAST (INCLUDING MRCP) TECHNIQUE: Multiplanar multisequence MR imaging of the abdomen was performed both before and after the administration of intravenous contrast. Heavily T2-weighted images of the biliary and pancreatic ducts were obtained, and three-dimensional MRCP images were  rendered by post processing. CONTRAST:  71mL GADAVIST GADOBUTROL 1 MMOL/ML IV SOLN COMPARISON:  CT scan 02/14/2021 FINDINGS: Lower chest: Unremarkable. Hepatobiliary: No suspicious focal abnormality within the liver parenchyma. There is no evidence for gallstones, gallbladder wall thickening, or pericholecystic fluid. No intrahepatic or extrahepatic biliary dilation. The common bile duct in the head of the pancreas measures 4-5 mm in diameter on today's exam. The 15 mm measurement obtained on CT scan earlier today actually represents an elongated tubular cystic component to the pancreatic head mass. As noted Pancreas: Multiloculated cystic mass is identified in the head of pancreas/uncinate process measuring 6.4 x 4.7 x 3.4 cm on CT earlier today. This is progressive comparing back to abdomen CT of 03/16/2019 when the lesion measured approximately 4.9 x 2.5 x 1.1 cm. Although postcontrast imaging is motion degraded, there is no appreciable enhancing mural nodularity or overtly suspicious nodular enhancing component. Given the prior surgery, recognition  of the main pancreatic duct is difficult but communication of the multicystic lesion to the pancreatic duct remnant in the head of the pancreas is suspected. Spleen:  Surgically absent. Adrenals/Urinary Tract: Right adrenal gland unremarkable. 12 mm left adrenal nodule is again noted. 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 Kidneys unremarkable. Stomach/Bowel: Stomach is unremarkable. No gastric wall thickening. No evidence of outlet obstruction. Duodenum is normally positioned as is the ligament of Treitz. No small bowel or colonic dilatation within the visualized abdomen. Wall thickening in the sigmoid colon described on earlier CT is visible on the coronal T2 imaging. Vascular/Lymphatic: No  abdominal aortic aneurysm. No abdominal lymphadenopathy Other:  No intraperitoneal free fluid. Musculoskeletal: No focal suspicious marrow enhancement within the visualized bony anatomy. IMPRESSION: 1. 6.4 x 4.7 x 3.4 cm multiloculated cystic mass in the head of pancreas/uncinate process. This is progressive comparing back to CT scan of 03/16/2019 when the lesion measured 4.9 x 2.5 x 1.1 cm. No appreciable enhancing mural nodularity or overtly suspicious nodular enhancing component identified. 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 2. No intrahepatic or extrahepatic biliary dilation. 3. 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 4. Wall thickening in the sigmoid colon better characterized on recent CT scan. Electronically Signed   By: Misty Stanley M.D.   On: 02/15/2021 05:32        Scheduled Meds:  aspirin  81 mg Oral Daily   atorvastatin  80 mg Oral Nightly   citalopram  10 mg Oral Daily   gabapentin  600 mg Oral QHS   insulin aspart  0-15 Units Subcutaneous Q4H   insulin glargine-yfgn  10 Units Subcutaneous Daily   lipase/protease/amylase  24,000 Units Oral TID   mirtazapine  15 mg Oral QHS   potassium chloride  40 mEq Oral Once   rOPINIRole  1 mg Oral BID   sodium chloride flush  3 mL Intravenous Q12H   Continuous Infusions:  0.9 % NaCl with KCl 40 mEq / L     piperacillin-tazobactam (ZOSYN)  IV Stopped (02/15/21 0902)     LOS: 1 day    Time spent: 79 min    Desma Maxim, MD [facility]   If 7PM-7AM, please contact night-coverage [email or web address] Password [email or web address] 02/15/2021, 9:15 AM

## 2021-02-15 NOTE — Progress Notes (Signed)
Pharmacy Antibiotic Note  Brittany [name] is a 61 y.o. female admitted on 02/14/2021 with Acute diverticulitis With abscess .  Pharmacy has been consulted for Zosyn dosing.  Plan: Zosyn 3.375g IV q8h (4 hour infusion).     Height: 5' (152.4 cm) Weight: 40.8 kg (90 lb) IBW/kg (Calculated) : 45.5  Temp (24hrs), Avg:98.7 F (37.1 C), Min:98.7 F (37.1 C), Max:98.7 F (37.1 C)  Recent Labs  Lab 02/13/21 2036 02/14/21 0927 02/14/21 1338 02/14/21 1556 02/15/21 0648  WBC 9.7  --  9.8  --  10.2  CREATININE 0.69 0.78  --   --  0.44  LATICACIDVEN  --   --   --  1.6  --     Estimated Creatinine Clearance: 47.6 mL/min (by C-G formula based on SCr of 0.44 mg/dL).    Allergies  Allergen Reactions   Cephalosporins Itching   Hydromorphone Itching   Keflin [Cephalothin] Itching    Antimicrobials this admission: zosyn 1/3 >>       >>    Dose adjustments this admission:    Microbiology results: 1/3 BCx: NG<24 hr   UCx:      Sputum:      MRSA PCR:   Cdiff GI panel  Thank you for allowing pharmacy to be a part of this patients care.  Jonpaul Lumm A 02/15/2021 9:21 AM

## 2021-02-16 DIAGNOSIS — K5732 Diverticulitis of large intestine without perforation or abscess without bleeding: Secondary | ICD-10-CM | POA: Diagnosis not present

## 2021-02-16 LAB — GASTROINTESTINAL PANEL BY PCR, STOOL (REPLACES STOOL CULTURE)

## 2021-02-16 LAB — CBC
HCT: 36.1 % (ref 36.0–46.0)
Hemoglobin: 11.8 g/dL — ABNORMAL LOW (ref 12.0–15.0)
MCH: 29.3 pg (ref 26.0–34.0)
MCHC: 32.7 g/dL (ref 30.0–36.0)
MCV: 89.6 fL (ref 80.0–100.0)
Platelets: 521 10*3/uL — ABNORMAL HIGH (ref 150–400)
RBC: 4.03 MIL/uL (ref 3.87–5.11)
RDW: 13 % (ref 11.5–15.5)
WBC: 12.9 10*3/uL — ABNORMAL HIGH (ref 4.0–10.5)
nRBC: 0.2 % (ref 0.0–0.2)

## 2021-02-16 LAB — COMPREHENSIVE METABOLIC PANEL
ALT: 10 U/L (ref 0–44)
AST: 12 U/L — ABNORMAL LOW (ref 15–41)
Albumin: 2.3 g/dL — ABNORMAL LOW (ref 3.5–5.0)
Alkaline Phosphatase: 85 U/L (ref 38–126)
Anion gap: 6 (ref 5–15)
BUN: 7 mg/dL — ABNORMAL LOW (ref 8–23)
CO2: 28 mmol/L (ref 22–32)
Calcium: 8 mg/dL — ABNORMAL LOW (ref 8.9–10.3)
Chloride: 99 mmol/L (ref 98–111)
Creatinine, Ser: 0.56 mg/dL (ref 0.44–1.00)
GFR, Estimated: >60 mL/min (ref >60)
Glucose, Bld: 120 mg/dL — ABNORMAL HIGH (ref 70–99)
Potassium: 4 mmol/L (ref 3.5–5.1)
Sodium: 133 mmol/L — ABNORMAL LOW (ref 135–145)
Total Bilirubin: 0.6 mg/dL (ref 0.3–1.2)
Total Protein: 6.6 g/dL (ref 6.5–8.1)

## 2021-02-16 LAB — CLOSTRIDIUM DIFFICILE BY PCR, REFLEXED: Toxigenic C. Difficile by PCR: POSITIVE — AB

## 2021-02-16 LAB — C DIFFICILE QUICK SCREEN W PCR REFLEX
C Diff antigen: POSITIVE — AB
C Diff toxin: NEGATIVE

## 2021-02-16 LAB — GLUCOSE, CAPILLARY
Glucose-Capillary: 128 mg/dL — ABNORMAL HIGH (ref 70–99)
Glucose-Capillary: 114 mg/dL — ABNORMAL HIGH (ref 70–99)
Glucose-Capillary: 192 mg/dL — ABNORMAL HIGH (ref 70–99)
Glucose-Capillary: 100 mg/dL — ABNORMAL HIGH (ref 70–99)

## 2021-02-16 MED ORDER — FIDAXOMICIN 200 MG PO TABS
200.0000 mg | ORAL_TABLET | Freq: Two times a day (BID) | ORAL | Status: DC
  Administered 2021-02-17 (×2): 200 mg via ORAL
  Filled 2021-02-16 (×3): qty 1

## 2021-02-16 MED ORDER — INSULIN ASPART 100 UNIT/ML IJ SOLN
0.0000 [IU] | Freq: Three times a day (TID) | INTRAMUSCULAR | Status: DC
  Administered 2021-02-17 (×2): 2 [IU] via SUBCUTANEOUS
  Filled 2021-02-16 (×2): qty 1

## 2021-02-16 MED ORDER — BOOST / RESOURCE BREEZE PO LIQD CUSTOM: 1.0000 | Freq: Three times a day (TID) | ORAL | Status: DC

## 2021-02-16 MED ORDER — POTASSIUM CHLORIDE IN NACL 40-0.9 MEQ/L-% IV SOLN
INTRAVENOUS | Status: AC
  Filled 2021-02-16 (×2): qty 1000

## 2021-02-16 MED ORDER — ADULT MULTIVITAMIN W/MINERALS CH
1.0000 | ORAL_TABLET | Freq: Every day | ORAL | Status: DC
  Administered 2021-02-17: 1 via ORAL
  Filled 2021-02-16: qty 1

## 2021-02-16 MED ORDER — GABAPENTIN 600 MG PO TABS
1800.0000 mg | ORAL_TABLET | Freq: Every day | ORAL | Status: DC
  Administered 2021-02-16: 1800 mg via ORAL
  Filled 2021-02-16: qty 3

## 2021-02-16 MED ORDER — ROPINIROLE HCL 1 MG PO TABS: 0.5000 mg | ORAL_TABLET | Freq: Every day | ORAL | Status: DC

## 2021-02-16 NOTE — Progress Notes (Addendum)
PROGRESS NOTE    Brittany [name]  DZH:[id number] DOB: [birth year] DOA: 02/14/2021 PCP: [facility], [facility]  Outpatient Specialists: oncology    Brief Narrative:   From admission h and p 61 y/o F w/ PMH of DM2, pancreatic cancer s/p pancreatectomy (80% removed) & splenectomy in 1980 (unknown type of pancreatic cancer), RLS, HLD, peripheral neuropathy, depression, chronic urinary frequency who presents w/ abd pain and diarrhea x 6 months. The abd pain and diarrhea has become progressively worse over the past week. The abd pain is located at lower right and left side of the abdomen. The pain is full, constant, w/ radiation to the back. Rubbing her belly helps the pain somewhat better and nothing makes the pain worse. The severity is currently 10/10. Pt has 3-4 episodes of diarrhea a day but denies any bloody or black tarry stools. Pt has had colonoscopy before in which polyps were removed that were benign. Pt is unsure of how long ago the colonoscopy was done. Also, pt c/o intermittent nausea. Pt denies any fever, chills, sweating, cough, chest pain, vomiting, dysuria, or constipation.    Assessment & Plan:   Principal Problem:   Diverticulitis of intestine with abscess   # Acute diverticulitis With abscess 3.4 cm on CT. Recent hospitalization for diverticulitis treated with abx. Hemodynamically stable. With diarrhea that is now resolved. S/p IR ct-guided aspiration on 1/4. 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 Followed by onc. EUS in 2021 consistent w/ pancreatic cyst. GI following here. MRCP 1/3 with progression of previously seen multiloculated cystic mass head of pancreas consistent  with ipmn. 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  # History CVA - cont stain, aspirin  # History cocaine use - says recently abstinent  # MDD - cont home celexa, mirtazapine  # RLS - home requip, gabapentin    DVT prophylaxis: lovenox Code Status: full Family Communication: daughter updated telephonically 1/5  Level of care: Med-Surg Status is: Inpatient  Remains inpatient appropriate because: severity of illness        Consultants:  Gi, gen surg  Procedures: none  Antimicrobials:  zosyn    Subjective: Pain improving, tolerating clears, no diarrhea or vomiting  Objective: Vitals:   02/15/21 2325 02/16/21 0520 02/16/21 0805 02/16/21 0814  BP: 93/67 136/84 (!) 165/98   Pulse: (!) 109 (!) 105 86   Resp: 18 18 18    Temp: 98.4 F (36.9 C) 98.8 F (37.1 C) 98.5 F (36.9 C)   TempSrc: Oral  Oral   SpO2: 99% 100% (!) 82% 100%  Weight:      Height:        Intake/Output Summary (Last 24 hours) at 02/16/2021 1529 Last data filed at 02/16/2021 0500 Gross per 24 hour  Intake 240 ml  Output 0 ml  Net 240 ml   Filed Weights   02/13/21 2031  Weight: 40.8 kg    Examination:  General exam: Appears calm and comfortable  Respiratory system: Clear to auscultation. Respiratory  effort normal. Cardiovascular system: S1 & S2 heard, RRR. No JVD, murmurs, rubs, gallops or clicks. No pedal edema. Gastrointestinal system: Abdomen is nondistended, soft, mild ttp left side. No organomegaly or masses felt. Normal bowel sounds heard. Central nervous system: Alert and oriented. Moves all 4 extremities Extremities: no edema Skin: No rashes, lesions or ulcers Psychiatry: Judgement and insight appear normal. Mood & affect appropriate.      Data Reviewed: I have personally reviewed following labs and imaging studies  CBC: Recent Labs  Lab 02/13/21 2036 02/14/21 1338 02/15/21 0648 02/16/21 0441  WBC 9.7 9.8 10.2 12.9*  HGB 13.3 12.7 12.8 11.8*  HCT 40.6 39.0 38.0 36.1  MCV 92.5 93.5 89.4 89.6  PLT 469* 542* 596* 979*   Basic Metabolic Panel: Recent Labs  Lab 02/13/21 2036 02/14/21 0927 02/15/21 0648 02/15/21 1550 02/16/21 0441  NA 127* 123* 133* 131* 133*  K 4.4 4.2 2.6* 3.5 4.0  CL 88* 85* 92* 95* 99  CO2 22 23 30 29 28   GLUCOSE 467* 594* 106* 128* 120*  BUN 11 14 6* 6* 7*  CREATININE 0.69 0.78 0.44 0.38* 0.56  CALCIUM 8.5* 8.4* 8.6* 8.2* 8.0*  MG  --   --  1.5*  --   --    GFR: Estimated Creatinine Clearance: 47.6 mL/min (by C-G formula based on SCr of 0.56 mg/dL). Liver Function Tests: Recent Labs  Lab 02/14/21 0927 02/15/21 0648 02/16/21 0441  AST 15 14* 12*  ALT 13 11 10   ALKPHOS 117 103 85  BILITOT 1.2 0.5 0.6  PROT 7.3 6.9 6.6  ALBUMIN 2.6* 2.4* 2.3*   Recent Labs  Lab 02/14/21 0927 02/15/21 0648  LIPASE 194* 239*   No results for input(s): AMMONIA in the last 168 hours. Coagulation Profile: No results for input(s): INR, PROTIME in the last 168 hours. Cardiac Enzymes: No results for input(s): CKTOTAL, CKMB, CKMBINDEX, TROPONINI in the last 168 hours. BNP (last 3 results) No results for input(s): PROBNP in the last 8760 hours. HbA1C: No results for input(s): HGBA1C in the last 72 hours. CBG: Recent Labs  Lab 02/15/21 1943 02/15/21 2336 02/16/21 0445 02/16/21 0801 02/16/21 1107  GLUCAP 312* 111* 128* 114* 192*   Lipid Profile: No results for input(s): CHOL, HDL, LDLCALC, TRIG, CHOLHDL, LDLDIRECT in the last 72 hours. Thyroid Function Tests: No results for input(s): TSH, T4TOTAL, FREET4, T3FREE, THYROIDAB in the last 72 hours. Anemia Panel: No results for input(s): VITAMINB12, FOLATE, FERRITIN, TIBC, IRON, RETICCTPCT in the last 72 hours. Urine analysis:     Component Value Date/Time   COLORURINE STRAW (A) 02/14/2021 1647   APPEARANCEUR CLEAR (A) 02/14/2021 1647   APPEARANCEUR Hazy 04/04/2011 1935   LABSPEC 1.016 02/14/2021 1647   LABSPEC >1.060 04/04/2011 1935   PHURINE 5.0 02/14/2021 1647   GLUCOSEU >=500 (A) 02/14/2021 1647   GLUCOSEU 50 mg/dL 04/04/2011 1935   HGBUR NEGATIVE 02/14/2021 1647   BILIRUBINUR NEGATIVE 02/14/2021 1647   BILIRUBINUR Negative 04/04/2011 1935   KETONESUR 5 (A) 02/14/2021 1647   PROTEINUR NEGATIVE 02/14/2021 1647   NITRITE NEGATIVE 02/14/2021 1647   LEUKOCYTESUR NEGATIVE 02/14/2021 1647   LEUKOCYTESUR Negative 04/04/2011 1935   Sepsis Labs: @LABRCNTIP (procalcitonin:4,lacticidven:4)  ) Recent Results (from the past 240 hour(s))  Resp Panel by RT-PCR (Flu A&B, Covid) Nasopharyngeal Swab     Status: None   Collection Time: 02/13/21  8:36 PM   Specimen: Nasopharyngeal Swab; Nasopharyngeal(NP) swabs in vial transport medium  Result Value Ref Range Status   SARS Coronavirus  2 by RT PCR NEGATIVE NEGATIVE Final    Comment: (NOTE) SARS-CoV-2 target nucleic acids are NOT DETECTED.  The SARS-CoV-2 RNA is generally detectable in upper respiratory specimens during the acute phase of infection. The lowest concentration of SARS-CoV-2 viral copies this assay can detect is 138 copies/mL. A negative result does not preclude SARS-Cov-2 infection and should not be used as the sole basis for treatment or other patient management decisions. A negative result may occur with  improper specimen collection/handling, submission of specimen other than nasopharyngeal swab, presence of viral mutation(s) within the areas targeted by this assay, and inadequate number of viral copies(<138 copies/mL). A negative result must be combined with clinical observations, patient history, and epidemiological information. The expected result is Negative.  Fact Sheet for Patients:  EntrepreneurPulse.com.au  Fact Sheet for  Healthcare Providers:  IncredibleEmployment.be  This test is no t yet approved or cleared by the [place] FDA and  has been authorized for detection and/or diagnosis of SARS-CoV-2 by FDA under an Emergency Use Authorization (EUA). This EUA will remain  in effect (meaning this test can be used) for the duration of the COVID-19 declaration under Section 564(b)(1) of the Act, 21 U.S.C.section 360bbb-3(b)(1), unless the authorization is terminated  or revoked sooner.       Influenza A by PCR NEGATIVE NEGATIVE Final   Influenza B by PCR NEGATIVE NEGATIVE Final    Comment: (NOTE) The Xpert Xpress SARS-CoV-2/FLU/RSV plus assay is intended as an aid in the diagnosis of influenza from Nasopharyngeal swab specimens and should not be used as a sole basis for treatment. Nasal washings and aspirates are unacceptable for Xpert Xpress SARS-CoV-2/FLU/RSV testing.  Fact Sheet for Patients: EntrepreneurPulse.com.au  Fact Sheet for Healthcare Providers: IncredibleEmployment.be  This test is not yet approved or cleared by the [place] FDA and has been authorized for detection and/or diagnosis of SARS-CoV-2 by FDA under an Emergency Use Authorization (EUA). This EUA will remain in effect (meaning this test can be used) for the duration of the COVID-19 declaration under Section 564(b)(1) of the Act, 21 U.S.C. section 360bbb-3(b)(1), unless the authorization is terminated or revoked.  Performed at [facility], [address] [place], [place] [address]   Blood culture (routine x 2)     Status: None (Preliminary result)   Collection Time: 02/14/21 11:43 AM   Specimen: BLOOD  Result Value Ref Range Status   Specimen Description BLOOD RIGHT FA  Final   Special Requests   Final    BOTTLES DRAWN AEROBIC AND ANAEROBIC Blood Culture adequate volume   Culture   Final    NO GROWTH 2 DAYS Performed at [facility], [address], [place], [place] [address]    Report Status PENDING  Incomplete  Blood culture (routine x 2)     Status: None (Preliminary result)   Collection Time: 02/14/21 11:51 AM   Specimen: BLOOD  Result Value Ref Range Status   Specimen Description BLOOD RIGHT [facility]  Final   Special Requests   Final    BOTTLES DRAWN AEROBIC AND ANAEROBIC Blood Culture adequate volume   Culture   Final    NO GROWTH 2 DAYS Performed at [facility], [address], [place], [place] [address]    Report Status PENDING  Incomplete  Aerobic/Anaerobic Culture w Gram Stain (surgical/deep wound)     Status: None (Preliminary result)   Collection Time: 02/15/21  2:50 PM   Specimen: Abscess  Result Value Ref Range Status  Specimen Description   Final    ABSCESS Performed at [facility], [address] [place], [place] [address]    Special Requests   Final    NONE Performed at [place], [place] [address]    Gram Stain   Final    ABUNDANT WBC PRESENT,BOTH PMN AND MONONUCLEAR ABUNDANT GRAM POSITIVE COCCI MODERATE GRAM POSITIVE RODS FEW GRAM NEGATIVE RODS    Culture   Final    TOO YOUNG TO READ Performed at [facility], [place] [address], [place], [place] [address]    Report Status PENDING  Incomplete         Radiology Studies: CT guided needle placement  Result Date: 02/15/2021 INDICATION: Left lower quadrant presumed diverticular abscess EXAM: CT ASPIRATION SMALL LEFT LOWER QUADRANT ABSCESS MEDICATIONS: The patient is currently admitted to the hospital and receiving intravenous antibiotics. The antibiotics were administered within an appropriate time frame prior to the initiation of the procedure. ANESTHESIA/SEDATION: Moderate (conscious) sedation was employed during this procedure. A total of Versed 0.5 mg and Fentanyl 25 mcg was administered intravenously by the radiology nurse. Total intra-service moderate Sedation Time: 6 minutes.  The patient's level of consciousness and vital signs were monitored continuously by radiology nursing throughout the procedure under my direct supervision. COMPLICATIONS: None immediate. PROCEDURE: Informed written consent was obtained from the patient after a thorough discussion of the procedural risks, benefits and alternatives. All questions were addressed. Maximal Sterile Barrier Technique was utilized including caps, mask, sterile gowns, sterile gloves, sterile drape, hand hygiene and skin antiseptic. A timeout was performed prior to the initiation of the procedure. previous imaging reviewed. patient positioned supine. noncontrast localization CT performed. the small left lower quadrant abscess was localized and marked for an anterior approach. under sterile conditions and local anesthesia, a 17 gauge coaxial guide was advanced from an anterior approach into the abscess. needle position confirmed with CT. syringe aspiration yielded 5 cc purulent fluid. sample sent for culture. needle removed. IMPRESSION: Successful CT aspiration of the small left lower quadrant diverticular abscess. Electronically Signed   By: Jerilynn Mages.  Shick M.D.   On: 02/15/2021 15:10   MR 3D Recon At Scanner  Result Date: 02/15/2021 CLINICAL DATA:  Pancreatic mass on recent CT scan. EXAM: MRI ABDOMEN WITHOUT AND WITH CONTRAST (INCLUDING MRCP) TECHNIQUE: Multiplanar multisequence MR imaging of the abdomen was performed both before and after the administration of intravenous contrast. Heavily T2-weighted images of the biliary and pancreatic ducts were obtained, and three-dimensional MRCP images were rendered by post processing. CONTRAST:  14mL GADAVIST GADOBUTROL 1 MMOL/ML IV SOLN COMPARISON:  CT scan 02/14/2021 FINDINGS: Lower chest: Unremarkable. Hepatobiliary: No suspicious focal abnormality within the liver parenchyma. There is no evidence for gallstones, gallbladder wall thickening, or pericholecystic fluid. No intrahepatic or extrahepatic  biliary dilation. The common bile duct in the head of the pancreas measures 4-5 mm in diameter on today's exam. The 15 mm measurement obtained on CT scan earlier today actually represents an elongated tubular cystic component to the pancreatic head mass. As noted Pancreas: Multiloculated cystic mass is identified in the head of pancreas/uncinate process measuring 6.4 x 4.7 x 3.4 cm on CT earlier today. This is progressive comparing back to abdomen CT of 03/16/2019 when the lesion measured approximately 4.9 x 2.5 x 1.1 cm. Although postcontrast imaging is motion degraded, there is no appreciable enhancing mural nodularity or overtly suspicious nodular enhancing component. Given the prior surgery, recognition of the main pancreatic  duct is difficult but communication of the multicystic lesion to the pancreatic duct remnant in the head of the pancreas is suspected. Spleen:  Surgically absent. Adrenals/Urinary Tract: Right adrenal gland unremarkable. 12 mm left adrenal nodule is again noted. 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 Kidneys unremarkable. Stomach/Bowel: Stomach is unremarkable. No gastric wall thickening. No evidence of outlet obstruction. Duodenum is normally positioned as is the ligament of Treitz. No small bowel or colonic dilatation within the visualized abdomen. Wall thickening in the sigmoid colon described on earlier CT is visible on the coronal T2 imaging. Vascular/Lymphatic: No abdominal aortic aneurysm. No abdominal lymphadenopathy Other:  No intraperitoneal free fluid. Musculoskeletal: No focal suspicious marrow enhancement within the visualized bony anatomy. IMPRESSION: 1. 6.4 x 4.7 x 3.4 cm multiloculated cystic mass in the head of pancreas/uncinate process. This is progressive comparing back to CT scan of 03/16/2019 when the  lesion measured 4.9 x 2.5 x 1.1 cm. No appreciable enhancing mural nodularity or overtly suspicious nodular enhancing component identified. 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 2. No intrahepatic or extrahepatic biliary dilation. 3. 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 4. Wall thickening in the sigmoid colon better characterized on recent CT scan. Electronically Signed   By: Misty Stanley M.D.   On: 02/15/2021 05:32   MR ABDOMEN MRCP W WO CONTAST  Result Date: 02/15/2021 CLINICAL DATA:  Pancreatic mass on recent CT scan. EXAM: MRI ABDOMEN WITHOUT AND WITH CONTRAST (INCLUDING MRCP) TECHNIQUE: Multiplanar multisequence MR imaging of the abdomen was performed both before and after the administration of intravenous contrast. Heavily T2-weighted images of the biliary and pancreatic ducts were obtained, and three-dimensional MRCP images were rendered by post processing. CONTRAST:  23mL GADAVIST GADOBUTROL 1 MMOL/ML IV SOLN COMPARISON:  CT scan 02/14/2021 FINDINGS: Lower chest: Unremarkable. Hepatobiliary: No suspicious focal abnormality within the liver parenchyma. There is no evidence for gallstones, gallbladder wall thickening, or pericholecystic fluid. No intrahepatic or extrahepatic biliary dilation. The common bile duct in the head of the pancreas measures 4-5 mm in diameter on today's exam. The 15 mm measurement obtained on CT scan earlier today actually represents an elongated tubular cystic component to the pancreatic head mass. As noted Pancreas: Multiloculated cystic mass is identified in the head of pancreas/uncinate process measuring 6.4 x 4.7 x 3.4 cm on CT earlier today. This is progressive comparing back to abdomen CT of 03/16/2019 when the lesion measured approximately 4.9 x 2.5 x 1.1 cm. Although postcontrast  imaging is motion degraded, there is no appreciable enhancing mural nodularity or overtly suspicious nodular enhancing component. Given the prior surgery, recognition of the main pancreatic duct is difficult but communication of the multicystic lesion to the pancreatic duct remnant in the head of the pancreas is suspected. Spleen:  Surgically absent. Adrenals/Urinary Tract: Right adrenal gland unremarkable. 12 mm left adrenal nodule is again noted. 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 Kidneys unremarkable. Stomach/Bowel: Stomach is unremarkable. No gastric wall thickening. No evidence of outlet obstruction. Duodenum  is normally positioned as is the ligament of Treitz. No small bowel or colonic dilatation within the visualized abdomen. Wall thickening in the sigmoid colon described on earlier CT is visible on the coronal T2 imaging. Vascular/Lymphatic: No abdominal aortic aneurysm. No abdominal lymphadenopathy Other:  No intraperitoneal free fluid. Musculoskeletal: No focal suspicious marrow enhancement within the visualized bony anatomy. IMPRESSION: 1. 6.4 x 4.7 x 3.4 cm multiloculated cystic mass in the head of pancreas/uncinate process. This is progressive comparing back to CT scan of 03/16/2019 when the lesion measured 4.9 x 2.5 x 1.1 cm. No appreciable enhancing mural nodularity or overtly suspicious nodular enhancing component identified. 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 2. No intrahepatic or extrahepatic biliary dilation. 3. 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 4. Wall thickening in the sigmoid colon better characterized on recent CT scan.  Electronically Signed   By: Misty Stanley M.D.   On: 02/15/2021 05:32        Scheduled Meds:  aspirin  81 mg Oral Daily   atorvastatin  80 mg Oral Nightly   feeding supplement  1 Container Oral TID BM   gabapentin  1,800 mg Oral QHS   insulin aspart  0-15 Units Subcutaneous Q4H   insulin glargine-yfgn  10 Units Subcutaneous Daily   lipase/protease/amylase  24,000 Units Oral TID   mirtazapine  15 mg Oral QHS   [START ON 02/17/2021] multivitamin with minerals  1 tablet Oral Daily   [START ON 02/17/2021] rOPINIRole  0.5 mg Oral QHS   sodium chloride flush  3 mL Intravenous Q12H   Continuous Infusions:  0.9 % NaCl with KCl 40 mEq / L 100 mL/hr at 02/16/21 1001   piperacillin-tazobactam (ZOSYN)  IV 3.375 g (02/16/21 1307)     LOS: 2 days    Time spent: 25 min    Desma Maxim, MD [facility]   If 7PM-7AM, please contact night-coverage [email or web address] Password [facility] 02/16/2021, 3:29 PM

## 2021-02-16 NOTE — Progress Notes (Signed)
[facility] SURGICAL PROGRESS NOTE (cpt [phone])  Hospital Day(s): 2.   Interval History: Patient seen and examined, no acute events or new complaints overnight. This morning, patient reports that she mostly complains of a headache and mild left sided abdominal soreness at her aspiration site. No fever, chills, emesis. Some intermittent nausea. She did have a slight bump in leukocytosis to 12.9K. Renal function remains normal; scr - 0.56; UO is unmeasured. Hypokalemia resolved; now 4.0. Cx from aspiration growing GPC, GPR, DNR. She continues on Zosyn. She is on CLD, but not eating much.   Review of Systems:  Constitutional: denies fever, chills  HEENT: denies cough or congestion  Respiratory: denies any shortness of breath  Cardiovascular: denies chest pain or palpitations  Gastrointestinal: + abdominal pain (mild, LLQ at drainage site), denied N/V Genitourinary: denies burning with urination or urinary frequency Neurological: + HA  Vital signs in last 24 hours: [min-max] current  Temp:  [98.4 F (36.9 C)-98.8 F (37.1 C)] 98.8 F (37.1 C) (01/05 0520) Pulse Rate:  [83-113] 105 (01/05 0520) Resp:  [11-20] 18 (01/05 0520) BP: (93-191)/(67-120) 136/84 (01/05 0520) SpO2:  [95 %-100 %] 100 % (01/05 0520)     Height: 5' (152.4 cm) Weight: 40.8 kg BMI (Calculated): 17.58   Intake/Output last 2 shifts:  01/04 0701 - 01/05 0700 In: 240 [P.O.:240] Out: 0    Physical Exam:  Constitutional: alert, cooperative and no distress  HENT: normocephalic without obvious abnormality  Eyes: PERRL, EOM's grossly intact and symmetric  Respiratory: breathing non-labored at rest  Cardiovascular: regular rate and sinus rhythm  Gastrointestinal: Abdomen is soft, she does not appear tender this morning, non-distended, no rebound/guarding, she is certainly without evidence of peritonitis  Musculoskeletal: no edema or wounds, motor and sensation grossly intact, NT    Labs:  CBC Latest Ref  Rng & Units 02/16/2021 02/15/2021 02/14/2021  WBC 4.0 - 10.5 K/uL 12.9(H) 10.2 9.8  Hemoglobin 12.0 - 15.0 g/dL 11.8(L) 12.8 12.7  Hematocrit 36.0 - 46.0 % 36.1 38.0 39.0  Platelets 150 - 400 K/uL 521(H) 596(H) 542(H)   CMP Latest Ref Rng & Units 02/16/2021 02/15/2021 02/15/2021  Glucose 70 - 99 mg/dL 120(H) 128(H) 106(H)  BUN 8 - 23 mg/dL 7(L) 6(L) 6(L)  Creatinine 0.44 - 1.00 mg/dL 0.56 0.38(L) 0.44  Sodium 135 - 145 mmol/L 133(L) 131(L) 133(L)  Potassium 3.5 - 5.1 mmol/L 4.0 3.5 2.6(LL)  Chloride 98 - 111 mmol/L 99 95(L) 92(L)  CO2 22 - 32 mmol/L 28 29 30   Calcium 8.9 - 10.3 mg/dL 8.0(L) 8.2(L) 8.6(L)  Total Protein 6.5 - 8.1 g/dL 6.6 - 6.9  Total Bilirubin 0.3 - 1.2 mg/dL 0.6 - 0.5  Alkaline Phos 38 - 126 U/L 85 - 103  AST 15 - 41 U/L 12(L) - 14(L)  ALT 0 - 44 U/L 10 - 11     Imaging studies: No new pertinent imaging studies   Assessment/Plan: (ICD-10's: K95.92) 61 y.o. female with colitis/diverticulitis with abscess without pneumoperitoneum s/p aspiration on 51/76, complicated by DKA, hypokalemia, and known pancreatic cystic lesion (Hx of IPMN).    - She wishes to continue CLD today - Continue IV Abx (Zosyn); follow up aspiration cultures which are growing GPC, GPR, DNR  - Monitor leukocytosis  - No need for emergent surgical intervention; We will continue to follow closely to ensure she does not deteriorate from abdominal standpoint   - Monitor abdominal examination; on-going bowel function - Pain control prn; antiemetics prn  - Mobilize  as tolerated             - Further management per primary service; we will follow    All of the above findings and recommendations were discussed with the patient, and the medical team, and all of patient's questions were answered to her expressed satisfaction.  -- Edison Simon, PA-C [facility] 02/16/2021, 7:35 AM [phone] M-F: 7am - 4pm
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
## 2021-02-16 NOTE — Progress Notes (Signed)
Pt admitted to room 211, oriented to unit and surroundings. Educated on fall prevention interventions, instructed to call for assist when needing to get out of bed. Medicated for c/o abdomen pain, will monitor for effectiveness. Bed lowest position, items within reach, bed alarm activated, call light within reach.

## 2021-02-17 ENCOUNTER — Other Ambulatory Visit (HOSPITAL_COMMUNITY): Payer: Self-pay

## 2021-02-17 DIAGNOSIS — A0472 Enterocolitis due to Clostridium difficile, not specified as recurrent: Secondary | ICD-10-CM

## 2021-02-17 DIAGNOSIS — E43 Unspecified severe protein-calorie malnutrition: Secondary | ICD-10-CM | POA: Insufficient documentation

## 2021-02-17 DIAGNOSIS — K5732 Diverticulitis of large intestine without perforation or abscess without bleeding: Secondary | ICD-10-CM | POA: Diagnosis not present

## 2021-02-17 DIAGNOSIS — E440 Moderate protein-calorie malnutrition: Secondary | ICD-10-CM | POA: Insufficient documentation

## 2021-02-17 DIAGNOSIS — A0471 Enterocolitis due to Clostridium difficile, recurrent: Secondary | ICD-10-CM

## 2021-02-17 HISTORY — DX: Enterocolitis due to Clostridium difficile, not specified as recurrent: A04.72

## 2021-02-17 LAB — COMPREHENSIVE METABOLIC PANEL
ALT: 11 U/L (ref 0–44)
AST: 17 U/L (ref 15–41)
Albumin: 2.5 g/dL — ABNORMAL LOW (ref 3.5–5.0)
Alkaline Phosphatase: 85 U/L (ref 38–126)
Anion gap: 5 (ref 5–15)
BUN: <5 mg/dL — ABNORMAL LOW (ref 8–23)
CO2: 27 mmol/L (ref 22–32)
Calcium: 8.5 mg/dL — ABNORMAL LOW (ref 8.9–10.3)
Chloride: 107 mmol/L (ref 98–111)
Creatinine, Ser: 0.56 mg/dL (ref 0.44–1.00)
GFR, Estimated: >60 mL/min (ref >60)
Glucose, Bld: 191 mg/dL — ABNORMAL HIGH (ref 70–99)
Potassium: 4.7 mmol/L (ref 3.5–5.1)
Sodium: 139 mmol/L (ref 135–145)
Total Bilirubin: 0.6 mg/dL (ref 0.3–1.2)
Total Protein: 6.9 g/dL (ref 6.5–8.1)

## 2021-02-17 LAB — CBC
HCT: 38.3 % (ref 36.0–46.0)
Hemoglobin: 12.2 g/dL (ref 12.0–15.0)
MCH: 29.8 pg (ref 26.0–34.0)
MCHC: 31.9 g/dL (ref 30.0–36.0)
MCV: 93.4 fL (ref 80.0–100.0)
Platelets: 506 10*3/uL — ABNORMAL HIGH (ref 150–400)
RBC: 4.1 MIL/uL (ref 3.87–5.11)
RDW: 13.7 % (ref 11.5–15.5)
WBC: 10.9 10*3/uL — ABNORMAL HIGH (ref 4.0–10.5)
nRBC: 0 % (ref 0.0–0.2)

## 2021-02-17 LAB — GLUCOSE, CAPILLARY: Glucose-Capillary: 199 mg/dL — ABNORMAL HIGH (ref 70–99)

## 2021-02-17 MED ORDER — VANCOMYCIN HCL 125 MG PO CAPS
125.0000 mg | ORAL_CAPSULE | Freq: Four times a day (QID) | ORAL | Status: DC
  Administered 2021-02-17: 125 mg via ORAL
  Filled 2021-02-17 (×3): qty 1

## 2021-02-17 MED ORDER — VANCOMYCIN HCL 125 MG PO CAPS: 125.0000 mg | ORAL_CAPSULE | Freq: Four times a day (QID) | ORAL | 0 refills | Status: DC

## 2021-02-17 MED ORDER — AMOXICILLIN-POT CLAVULANATE 875-125 MG PO TABS: 1.0000 | ORAL_TABLET | Freq: Three times a day (TID) | ORAL | 0 refills | Status: DC

## 2021-02-17 MED ORDER — AMOXICILLIN-POT CLAVULANATE 875-125 MG PO TABS: 1.0000 | ORAL_TABLET | Freq: Three times a day (TID) | ORAL | Status: DC

## 2021-02-17 NOTE — Progress Notes (Signed)
Initial Nutrition Assessment  DOCUMENTATION CODES:   Severe malnutrition in context of chronic illness  INTERVENTION:   Boost Breeze po TID, each supplement provides 250 kcal and 9 grams of protein  MVI po daily   Double protein with meal trays  Pt at high refeed risk; recommend monitor potassium, magnesium and phosphorus labs daily until stable  NUTRITION DIAGNOSIS:   Severe Malnutrition related to chronic illness (COPD) as evidenced by severe muscle depletion, severe fat depletion.  GOAL:   Patient will meet greater than or equal to 90% of their needs  MONITOR:   PO intake, Supplement acceptance, Labs, Weight trends, Skin, I & O's  REASON FOR ASSESSMENT:   Malnutrition Screening Tool    ASSESSMENT:   61 y/o female with h/o DM2, pancreatic cancer s/p pancreatectomy (80% removed) & splenectomy in 1980 (unknown type of pancreatic cancer), RLS, HLD, peripheral neuropathy, MDD, anxiety, GERD, COPD, CVA, substance abuse and chronic urinary frequency who presents with acute diverticulitis with abscess.  Pt s/p CT aspiration of abscess 1/4  Met with pt in room today. Pt reports good appetite and oral intake pta and in hospital. Pt reports eating almost all of her breakfast and lunch today but reports that she dropped her chicken on the floor. Pt is upset as she reports that she has not been able to order double meats with her meals. Pt reports that she is not drinking Ensure because she is lactose intolerant. RD discussed with pt the importance of adequate nutrition needed to preserve lean muscle. RD will change Ensue to [facility]. RD will also order double protein with meals trays. Per chart, pt is down 20lbs(18%) in < 1 year; this is significant.   Medications reviewed and include: augmentin, aspirin, insulin, creon, remeron, vancomycin   Labs reviewed: K 4.7 wnl, BUN <5(L) Wbc- 10.9(H)  NUTRITION - FOCUSED PHYSICAL EXAM:  Flowsheet Row Most Recent Value  Orbital  Region Moderate depletion  Upper Arm Region Severe depletion  Thoracic and Lumbar Region Severe depletion  Buccal Region Moderate depletion  Temple Region Moderate depletion  Clavicle Bone Region Severe depletion  Clavicle and Acromion Bone Region Severe depletion  Scapular Bone Region Severe depletion  Dorsal Hand Severe depletion  Patellar Region Severe depletion  Anterior Thigh Region Severe depletion  Posterior Calf Region Severe depletion  Edema (RD Assessment) None  Hair Reviewed  Eyes Reviewed  Mouth Reviewed  Skin Reviewed  Nails Reviewed   Diet Order:   Diet Order             Diet regular Room service appropriate? Yes; Fluid consistency: Thin  Diet effective now                  EDUCATION NEEDS:   Education needs have been addressed  Skin:  Skin Assessment: Reviewed RN Assessment  Last BM:  1/5- type 7  Height:   Ht Readings from Last 1 Encounters:  02/13/21 5' (1.524 m)    Weight:   Wt Readings from Last 1 Encounters:  02/13/21 40.8 kg    Ideal Body Weight:  45.45 kg  BMI:  Body mass index is 17.58 kg/m.  Estimated Nutritional Needs:   Kcal:  1300-1500kcal/day  Protein:  65-75g/day  Fluid:  1.2-1.4L/day  Koleen Distance MS, RD, LDN Please refer to [facility] for RD and/or RD on-call/weekend/after hours pager

## 2021-02-17 NOTE — Discharge Summary (Signed)
Brittany [name] MHD:[id number] DOB: Dec 18, 1960 DOA: 02/14/2021  PCP: [facility]  Admit date: 02/14/2021 Discharge date: 02/17/2021  Time spent: 40 minutes  Recommendations for Outpatient Follow-up:  [facility] f/u Gen surg f/u PCP f/u     Discharge Diagnoses:  Principal Problem:   Diverticulitis of intestine with abscess Active Problems:   C. difficile colitis   Protein-calorie malnutrition, severe   Discharge Condition: stable  Diet recommendation: carb modified  Filed Weights   02/13/21 2031  Weight: 40.8 kg    History of present illness:  61 y/o F w/ PMH of DM2, pancreatic cancer s/p pancreatectomy (80% removed) & splenectomy in 1980 (unknown type of pancreatic cancer), RLS, HLD, peripheral neuropathy, depression, chronic urinary frequency who presents w/ abd pain and diarrhea x 6 months. The abd pain and diarrhea has become progressively worse over the past week. The abd pain is located at lower right and left side of the abdomen. The pain is full, constant, w/ radiation to the back. Rubbing her belly helps the pain somewhat better and nothing makes the pain worse. The severity is currently 10/10. Pt has 3-4 episodes of diarrhea a day but denies any bloody or black tarry stools. Pt has had colonoscopy before in which polyps were removed that were benign. Pt is unsure of how long ago the colonoscopy was done. Also, pt c/o intermittent nausea. Pt denies any fever, chills, sweating, cough, chest pain, vomiting, dysuria, or constipation.   Hospital Course:  # Acute diverticulitis With abscess 3.4 cm on CT. Recent hospitalization for diverticulitis treated with abx. Hemodynamically stable. With diarrhea that is now resolved. S/p IR ct-guided aspiration on 1/4. 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 Followed by onc. EUS in 2021 consistent w/ pancreatic cyst. GI following here. MRCP 1/3 with progression of previously seen multiloculated cystic mass head of pancreas consistent with ipmn. No signs ductal blockage - GI consulted, advises outpt f/u with GI - cont home creon  # C diff colitis Chronic diarrhea worsened recently, was recently treated with abx. Toxin positive, antigen neg, pcr positive. Mild. - 10 days po vancomycin   Procedures: none   Consultations: Gen surg, GI  Discharge Exam: Vitals:   02/16/21 2043 02/17/21 0733  BP: 117/77 127/85  Pulse: 92 (!) 101  Resp: 16 16  Temp: 98.7 F (37.1 C)   SpO2: 99% 97%    General exam: Appears calm and comfortable  Respiratory system: Clear to auscultation. Respiratory effort normal. Cardiovascular system: S1 & S2 heard, RRR. No JVD, murmurs, rubs, gallops or clicks. No pedal edema. Gastrointestinal system: Abdomen is nondistended, soft, mild ttp left side. No organomegaly or masses felt. Normal bowel sounds heard. Central nervous system: Alert and oriented. Moves all 4 extremities Extremities: no edema Skin: No rashes, lesions or ulcers Psychiatry: Judgement and insight appear normal. Mood & affect appropriate.   Discharge Instructions   Discharge Instructions     Diet - low sodium heart healthy   Complete by: As directed    Increase activity slowly   Complete by: As directed    No wound care   Complete by: As directed       Allergies as of 02/17/2021       Reactions   Cephalosporins Itching   Hydromorphone Itching   Keflin [cephalothin] Itching   Lactose Intolerance (  gi) Diarrhea        Medication List     TAKE these medications    amoxicillin-clavulanate 875-125 MG tablet Commonly known as: AUGMENTIN Take 1 tablet by mouth 3 (three) times daily.   aspirin 81 MG chewable tablet Chew 1 tablet (81 mg total) by mouth daily.   atorvastatin 80 MG tablet Commonly known as: LIPITOR Take  1 tablet (80 mg total) by mouth Nightly.   benazepril 10 MG tablet Commonly known as: LOTENSIN Take 10 mg by mouth daily.   diclofenac Sodium 1 % Gel Commonly known as: VOLTAREN Apply 2 g topically 4 (four) times daily.   feeding supplement (GLUCERNA SHAKE) Liqd Take 237 mLs by mouth 3 (three) times daily between meals.   gabapentin 600 MG tablet Commonly known as: NEURONTIN Take 1 tablet (600 mg total) by mouth at bedtime. What changed: how much to take   mirtazapine 15 MG tablet Commonly known as: REMERON Take 1 tablet (15 mg total) by mouth at bedtime.   NovoLIN 70/30 (70-30) 100 UNIT/ML injection Generic drug: insulin NPH-regular Human Inject 28 Units into the skin 2 (two) times daily with a meal. What changed:  how much to take when to take this   Pancrelipase (Lip-Prot-Amyl) 24000-76000 units Cpep Take 3 capsules (72,000 Units total) by mouth 3 (three) times daily. What changed: additional instructions   potassium chloride SA 20 MEQ tablet Commonly known as: KLOR-CON M Take 2 tablets by mouth daily.   rOPINIRole 1 MG tablet Commonly known as: REQUIP Take 1 tablet (1 mg total) by mouth 2 (two) times daily. What changed:  how much to take when to take this   vancomycin 125 MG capsule Commonly known as: VANCOCIN Take 1 capsule (125 mg total) by mouth 4 (four) times daily.       Allergies  Allergen Reactions   Cephalosporins Itching   Hydromorphone Itching   Keflin [Cephalothin] Itching   Lactose Intolerance (Gi) Diarrhea    Follow-up Information     Pabon, [place] F, MD Follow up in 3 week(s).   Specialty: General Surgery Contact information: [address] [place] [address] [facility]. Schedule an appointment as soon as possible for a visit.   Contact information: [place] [address] [phone]         Reita Cliche, MD Follow up.   Specialty:  Gastroenterology Contact information: [place] [address] [phone]                  The results of significant diagnostics from this hospitalization (including imaging, microbiology, ancillary and laboratory) are listed below for reference.    Significant Diagnostic Studies: DG Chest 2 View  Result Date: 02/13/2021 CLINICAL DATA:  Body aches and loss of appetite. EXAM: CHEST - 2 VIEW COMPARISON:  December 25, 2020 FINDINGS: The heart size and mediastinal contours are within normal limits. Both lungs are clear. Radiopaque surgical clips are seen within the left upper quadrant. The visualized skeletal structures are unremarkable. IMPRESSION: No active cardiopulmonary disease. Electronically Signed   By: Virgina Norfolk M.D.   On: 02/13/2021 23:58   CT guided needle placement  Result Date: 02/15/2021 INDICATION: Left lower quadrant presumed diverticular abscess EXAM: CT ASPIRATION SMALL LEFT LOWER QUADRANT ABSCESS MEDICATIONS: The patient is currently admitted to the hospital and receiving intravenous antibiotics. The antibiotics were administered within an appropriate time frame  prior to the initiation of the procedure. ANESTHESIA/SEDATION: Moderate (conscious) sedation was employed during this procedure. A total of Versed 0.5 mg and Fentanyl 25 mcg was administered intravenously by the radiology nurse. Total intra-service moderate Sedation Time: 6 minutes. The patient's level of consciousness and vital signs were monitored continuously by radiology nursing throughout the procedure under my direct supervision. COMPLICATIONS: None immediate. PROCEDURE: Informed written consent was obtained from the patient after a thorough discussion of the procedural risks, benefits and alternatives. All questions were addressed. Maximal Sterile Barrier Technique was utilized including caps, mask, sterile gowns, sterile gloves, sterile drape, hand hygiene and skin antiseptic. A timeout  was performed prior to the initiation of the procedure. previous imaging reviewed. patient positioned supine. noncontrast localization CT performed. the small left lower quadrant abscess was localized and marked for an anterior approach. under sterile conditions and local anesthesia, a 17 gauge coaxial guide was advanced from an anterior approach into the abscess. needle position confirmed with CT. syringe aspiration yielded 5 cc purulent fluid. sample sent for culture. needle removed. IMPRESSION: Successful CT aspiration of the small left lower quadrant diverticular abscess. Electronically Signed   By: Jerilynn Mages.  Shick M.D.   On: 02/15/2021 15:10   CT ABDOMEN PELVIS W CONTRAST  Result Date: 02/14/2021 CLINICAL DATA:  Abdominal pain and body aches. Loss of appetite and generalized weakness. EXAM: CT ABDOMEN AND PELVIS WITH CONTRAST TECHNIQUE: Multidetector CT imaging of the abdomen and pelvis was performed using the standard protocol following bolus administration of intravenous contrast. CONTRAST:  1mL OMNIPAQUE IOHEXOL 300 MG/ML  SOLN COMPARISON:  03/16/2019 FINDINGS: Lower chest: The lung bases are clear of acute process. No pleural effusion or pulmonary lesions. The heart is normal in size. No pericardial effusion. The distal esophagus and aorta are unremarkable. Hepatobiliary: No hepatic lesions or intrahepatic biliary dilatation. The gallbladder is unremarkable. Marked common bile duct dilatation, new since prior study measuring up to 15 mm in the head of the pancreas. No obvious common bile duct stone. Pancreas: Surgical changes related to a remote partial pancreatectomy. The body and tail of been resected. The head is still present. There is a large complex multi septated cystic lesion in the pancreatic head measuring a maximum of 4.5 x 3.0 cm. This has enlarged since the prior CT scan where it measured approximately 2.5 x 1.1 cm. Findings highly suspicious for IPMN. Recommend follow-up MR imaging of the  abdomen without and with contrast or endoscopic ultrasound and potential biopsy. Spleen: Surgically absent. Adrenals/Urinary Tract: Adrenal glands and kidneys are unremarkable. No renal lesions or hydronephrosis. The bladder is unremarkable. Stomach/Bowel: The stomach, duodenum and small bowel are grossly normal without oral contrast. No mass lesions or obstructive findings. There is a severe inflammatory process involving the distal descending colon and upper sigmoid colon with marked wall thickening and mucosal and serosal enhancement. Although this could be an inflammatory or infectious colitis think it is most likely diverticulitis given the severe underlying diverticulosis. There appears to be a small adjacent abscess measuring 3.4 cm on image 61/2. Vascular/Lymphatic: The aorta demonstrates advanced atherosclerotic calcifications but no aneurysm dissection. Branch vessel calcifications are also noted. The major venous structures are grossly patent. No mesenteric or retroperitoneal adenopathy. Reproductive: The uterus and ovaries are unremarkable. Other: No free pelvic fluid collections. No inguinal mass or adenopathy. Musculoskeletal: No significant bony findings. IMPRESSION: 1. Severe inflammatory or infectious colitis involving the distal descending colon and upper sigmoid colon, most likely diverticulitis. There is a small adjacent abscess measuring  3.4 cm. 2. Enlarging complex multi septated cystic lesion in the pancreatic head. Findings highly suspicious for IPMN. Recommend GI consultation and hilar follow-up MR imaging of the abdomen without and with contrast or endoscopic ultrasound and potential biopsy. 3. New marked common bile duct dilatation likely due to the pancreatic head lesion. 4. Status post partial pancreatectomy and splenectomy. 5. Advanced atherosclerotic calcifications involving the aorta and branch vessels. 6. Aortic atherosclerosis. Aortic Atherosclerosis (ICD10-I70.0). Electronically  Signed   By: Marijo Sanes M.D.   On: 02/14/2021 10:25   MR 3D Recon At Scanner  Result Date: 02/15/2021 CLINICAL DATA:  Pancreatic mass on recent CT scan. EXAM: MRI ABDOMEN WITHOUT AND WITH CONTRAST (INCLUDING MRCP) TECHNIQUE: Multiplanar multisequence MR imaging of the abdomen was performed both before and after the administration of intravenous contrast. Heavily T2-weighted images of the biliary and pancreatic ducts were obtained, and three-dimensional MRCP images were rendered by post processing. CONTRAST:  79mL GADAVIST GADOBUTROL 1 MMOL/ML IV SOLN COMPARISON:  CT scan 02/14/2021 FINDINGS: Lower chest: Unremarkable. Hepatobiliary: No suspicious focal abnormality within the liver parenchyma. There is no evidence for gallstones, gallbladder wall thickening, or pericholecystic fluid. No intrahepatic or extrahepatic biliary dilation. The common bile duct in the head of the pancreas measures 4-5 mm in diameter on today's exam. The 15 mm measurement obtained on CT scan earlier today actually represents an elongated tubular cystic component to the pancreatic head mass. As noted Pancreas: Multiloculated cystic mass is identified in the head of pancreas/uncinate process measuring 6.4 x 4.7 x 3.4 cm on CT earlier today. This is progressive comparing back to abdomen CT of 03/16/2019 when the lesion measured approximately 4.9 x 2.5 x 1.1 cm. Although postcontrast imaging is motion degraded, there is no appreciable enhancing mural nodularity or overtly suspicious nodular enhancing component. Given the prior surgery, recognition of the main pancreatic duct is difficult but communication of the multicystic lesion to the pancreatic duct remnant in the head of the pancreas is suspected. Spleen:  Surgically absent. Adrenals/Urinary Tract: Right adrenal gland unremarkable. 12 mm left adrenal nodule is again noted. 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 Kidneys unremarkable. Stomach/Bowel: Stomach is unremarkable. No gastric wall thickening. No evidence of outlet obstruction. Duodenum is normally positioned as is the ligament of Treitz. No small bowel or colonic dilatation within the visualized abdomen. Wall thickening in the sigmoid colon described on earlier CT is visible on the coronal T2 imaging. Vascular/Lymphatic: No abdominal aortic aneurysm. No abdominal lymphadenopathy Other:  No intraperitoneal free fluid. Musculoskeletal: No focal suspicious marrow enhancement within the visualized bony anatomy. IMPRESSION: 1. 6.4 x 4.7 x 3.4 cm multiloculated cystic mass in the head of pancreas/uncinate process. This is progressive comparing back to CT scan of 03/16/2019 when the lesion measured 4.9 x 2.5 x 1.1 cm. No appreciable enhancing mural nodularity or overtly suspicious nodular enhancing component identified. 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 2. No intrahepatic or extrahepatic biliary dilation. 3. 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 4. Wall thickening in the sigmoid colon better characterized on recent CT scan. Electronically Signed   By: Misty Stanley M.D.   On: 02/15/2021 05:32   MR  ABDOMEN MRCP W WO CONTAST  Result Date: 02/15/2021 CLINICAL DATA:  Pancreatic mass on recent CT scan. EXAM: MRI ABDOMEN WITHOUT AND WITH CONTRAST (INCLUDING MRCP) TECHNIQUE: Multiplanar multisequence MR imaging of the abdomen was performed both before and after the administration of intravenous contrast. Heavily T2-weighted images of the biliary and pancreatic ducts were obtained, and three-dimensional MRCP images were rendered by post processing. CONTRAST:  51mL GADAVIST GADOBUTROL 1  MMOL/ML IV SOLN COMPARISON:  CT scan 02/14/2021 FINDINGS: Lower chest: Unremarkable. Hepatobiliary: No suspicious focal abnormality within the liver parenchyma. There is no evidence for gallstones, gallbladder wall thickening, or pericholecystic fluid. No intrahepatic or extrahepatic biliary dilation. The common bile duct in the head of the pancreas measures 4-5 mm in diameter on today's exam. The 15 mm measurement obtained on CT scan earlier today actually represents an elongated tubular cystic component to the pancreatic head mass. As noted Pancreas: Multiloculated cystic mass is identified in the head of pancreas/uncinate process measuring 6.4 x 4.7 x 3.4 cm on CT earlier today. This is progressive comparing back to abdomen CT of 03/16/2019 when the lesion measured approximately 4.9 x 2.5 x 1.1 cm. Although postcontrast imaging is motion degraded, there is no appreciable enhancing mural nodularity or overtly suspicious nodular enhancing component. Given the prior surgery, recognition of the main pancreatic duct is difficult but communication of the multicystic lesion to the pancreatic duct remnant in the head of the pancreas is suspected. Spleen:  Surgically absent. Adrenals/Urinary Tract: Right adrenal gland unremarkable. 12 mm left adrenal nodule is again noted. 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 Kidneys unremarkable. Stomach/Bowel: Stomach is unremarkable. No gastric wall thickening. No evidence of outlet obstruction. Duodenum is normally positioned as is the ligament of Treitz. No small bowel or colonic dilatation within the visualized abdomen. Wall thickening in the sigmoid colon described on earlier CT is visible on the coronal T2 imaging. Vascular/Lymphatic: No abdominal aortic aneurysm. No abdominal lymphadenopathy Other:  No  intraperitoneal free fluid. Musculoskeletal: No focal suspicious marrow enhancement within the visualized bony anatomy. IMPRESSION: 1. 6.4 x 4.7 x 3.4 cm multiloculated cystic mass in the head of pancreas/uncinate process. This is progressive comparing back to CT scan of 03/16/2019 when the lesion measured 4.9 x 2.5 x 1.1 cm. No appreciable enhancing mural nodularity or overtly suspicious nodular enhancing component identified. 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 2. No intrahepatic or extrahepatic biliary dilation. 3. 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 4. Wall thickening in the sigmoid colon better characterized on recent CT scan. Electronically Signed   By: Misty Stanley M.D.   On: 02/15/2021 05:32    Microbiology: Recent Results (from the past 240 hour(s))  Resp Panel by RT-PCR (Flu A&B, Covid) Nasopharyngeal Swab     Status: None   Collection Time: 02/13/21  8:36 PM   Specimen: Nasopharyngeal Swab; Nasopharyngeal(NP) swabs in vial transport medium  Result Value Ref Range Status   SARS Coronavirus 2 by RT PCR NEGATIVE NEGATIVE Final    Comment: (NOTE) SARS-CoV-2 target nucleic acids are NOT DETECTED.  The SARS-CoV-2 RNA is generally detectable in upper respiratory specimens during the acute phase of infection. The lowest concentration  of SARS-CoV-2 viral copies this assay can detect is 138 copies/mL. A negative result does not preclude SARS-Cov-2 infection and should not be used as the sole basis for treatment or other patient management decisions. A negative result may occur with  improper specimen collection/handling, submission of specimen other than nasopharyngeal swab, presence of viral mutation(s) within the areas targeted by this assay, and inadequate number of viral copies(<138 copies/mL). A  negative result must be combined with clinical observations, patient history, and epidemiological information. The expected result is Negative.  Fact Sheet for Patients:  EntrepreneurPulse.com.au  Fact Sheet for Healthcare Providers:  IncredibleEmployment.be  This test is no t yet approved or cleared by the [place] FDA and  has been authorized for detection and/or diagnosis of SARS-CoV-2 by FDA under an Emergency Use Authorization (EUA). This EUA will remain  in effect (meaning this test can be used) for the duration of the COVID-19 declaration under Section 564(b)(1) of the Act, 21 U.S.C.section 360bbb-3(b)(1), unless the authorization is terminated  or revoked sooner.       Influenza A by PCR NEGATIVE NEGATIVE Final   Influenza B by PCR NEGATIVE NEGATIVE Final    Comment: (NOTE) The Xpert Xpress SARS-CoV-2/FLU/RSV plus assay is intended as an aid in the diagnosis of influenza from Nasopharyngeal swab specimens and should not be used as a sole basis for treatment. Nasal washings and aspirates are unacceptable for Xpert Xpress SARS-CoV-2/FLU/RSV testing.  Fact Sheet for Patients: EntrepreneurPulse.com.au  Fact Sheet for Healthcare Providers: IncredibleEmployment.be  This test is not yet approved or cleared by the [place] FDA and has been authorized for detection and/or diagnosis of SARS-CoV-2 by FDA under an Emergency Use Authorization (EUA). This EUA will remain in effect (meaning this test can be used) for the duration of the COVID-19 declaration under Section 564(b)(1) of the Act, 21 U.S.C. section 360bbb-3(b)(1), unless the authorization is terminated or revoked.  Performed at [facility], [address] [place], [place] [address]   Blood culture (routine x 2)     Status: None (Preliminary result)   Collection Time: 02/14/21 11:43 AM   Specimen: BLOOD  Result Value Ref  Range Status   Specimen Description BLOOD RIGHT FA  Final   Special Requests   Final    BOTTLES DRAWN AEROBIC AND ANAEROBIC Blood Culture adequate volume   Culture   Final    NO GROWTH 3 DAYS Performed at [facility], [address], [place], [place] [address]    Report Status PENDING  Incomplete  Blood culture (routine x 2)     Status: None (Preliminary result)   Collection Time: 02/14/21 11:51 AM   Specimen: BLOOD  Result Value Ref Range Status   Specimen Description BLOOD RIGHT [facility]  Final   Special Requests   Final    BOTTLES DRAWN AEROBIC AND ANAEROBIC Blood Culture adequate volume   Culture   Final    NO GROWTH 3 DAYS Performed at [facility], [address], [place], [place] [address]    Report Status PENDING  Incomplete  C Difficile Quick Screen w PCR reflex     Status: Abnormal   Collection Time: 02/14/21  4:00 PM   Specimen: STOOL  Result Value Ref Range Status   C Diff antigen POSITIVE (A) NEGATIVE Final   C Diff toxin NEGATIVE NEGATIVE Final   C Diff interpretation Results are indeterminate. See PCR results.  Final    Comment: Performed at [facility], [address] [place], [place] [address]  Gastrointestinal Panel by PCR , Stool     Status: None   Collection Time: 02/14/21  4:00 PM   Specimen: Stool  Result Value Ref Range Status   Campylobacter species NOT DETECTED NOT DETECTED Final   Plesimonas shigelloides NOT DETECTED NOT DETECTED Final   Salmonella species NOT DETECTED NOT DETECTED Final   Yersinia enterocolitica NOT DETECTED NOT DETECTED Final   Vibrio species NOT DETECTED NOT DETECTED Final   Vibrio cholerae NOT DETECTED NOT DETECTED Final   Enteroaggregative E coli (EAEC) NOT DETECTED NOT DETECTED Final   Enteropathogenic E coli (EPEC) NOT DETECTED NOT DETECTED Final   Enterotoxigenic E coli (ETEC) NOT DETECTED NOT DETECTED Final   Shiga like toxin producing E coli (STEC) NOT DETECTED NOT DETECTED Final    Shigella/Enteroinvasive E coli (EIEC) NOT DETECTED NOT DETECTED Final   Cryptosporidium NOT DETECTED NOT DETECTED Final   Cyclospora cayetanensis NOT DETECTED NOT DETECTED Final   Entamoeba histolytica NOT DETECTED NOT DETECTED Final   Giardia lamblia NOT DETECTED NOT DETECTED Final   Adenovirus F40/41 NOT DETECTED NOT DETECTED Final   Astrovirus NOT DETECTED NOT DETECTED Final   Norovirus GI/GII NOT DETECTED NOT DETECTED Final   Rotavirus A NOT DETECTED NOT DETECTED Final   Sapovirus (I, II, IV, and V) NOT DETECTED NOT DETECTED Final    Comment: Performed at [facility], [address] [place], [place] [address]  C. Diff by PCR, Reflexed     Status: Abnormal   Collection Time: 02/14/21  4:00 PM  Result Value Ref Range Status   Toxigenic C. Difficile by PCR POSITIVE (A) NEGATIVE Final    Comment: Positive for toxigenic C. difficile with little to no toxin production. Only treat if clinical presentation suggests symptomatic illness. Performed at [facility] [facility], [address] [place], [place] [address]   Aerobic/Anaerobic Culture w Gram Stain (surgical/deep wound)     Status: None (Preliminary result)   Collection Time: 02/15/21  2:50 PM   Specimen: Abscess  Result Value Ref Range Status   Specimen Description   Final    ABSCESS Performed at [facility], [address], [place], [place] [address]    Special Requests   Final    NONE Performed at [facility], [place], [place] [address]    Gram Stain   Final    ABUNDANT WBC PRESENT,BOTH PMN AND MONONUCLEAR ABUNDANT GRAM POSITIVE COCCI MODERATE GRAM POSITIVE RODS FEW GRAM NEGATIVE RODS    Culture   Final    FEW GRAM NEGATIVE RODS CULTURE REINCUBATED FOR BETTER GROWTH ABUNDANT LACTOBACILLUS SPECIES Standardized susceptibility testing for this organism is not available. Performed at [facility], [place] [address] [place], [place] [address]    Report Status PENDING   Incomplete     Labs: Basic Metabolic Panel: Recent Labs  Lab 02/14/21 0927 02/15/21 0648 02/15/21 1550 02/16/21 0441 02/17/21 0526  NA 123* 133* 131* 133* 139  K 4.2 2.6* 3.5 4.0 4.7  CL 85* 92* 95* 99 107  CO2 23 30 29 28 27   GLUCOSE 594* 106* 128* 120* 191*  BUN 14 6* 6* 7* <5*  CREATININE 0.78 0.44 0.38* 0.56 0.56  CALCIUM 8.4* 8.6* 8.2* 8.0* 8.5*  MG  --  1.5*  --   --   --    Liver Function Tests: Recent Labs  Lab 02/14/21 0927 02/15/21 0648 02/16/21 0441 02/17/21 0526  AST 15 14* 12* 17  ALT 13 11 10 11   ALKPHOS 117 103 85 85  BILITOT 1.2 0.5 0.6 0.6  PROT 7.3 6.9 6.6 6.9  ALBUMIN 2.6* 2.4* 2.3* 2.5*   Recent Labs  Lab 02/14/21 0927 02/15/21 0648  LIPASE 194* 239*   No results for input(s): AMMONIA in the last 168 hours. CBC: Recent Labs  Lab 02/13/21 2036 02/14/21 1338 02/15/21 0648 02/16/21 0441 02/17/21 0526  WBC 9.7 9.8 10.2 12.9* 10.9*  HGB 13.3 12.7 12.8 11.8* 12.2  HCT 40.6 39.0 38.0 36.1 38.3  MCV 92.5 93.5 89.4 89.6 93.4  PLT 469* 542* 596* 521* 506*   Cardiac Enzymes: No results for input(s): CKTOTAL, CKMB, CKMBINDEX, TROPONINI in the last 168 hours. BNP: BNP (last 3 results) No results for input(s): BNP in the last 8760 hours.  ProBNP (last 3 results) No results for input(s): PROBNP in the last 8760 hours.  CBG: Recent Labs  Lab 02/16/21 0445 02/16/21 0801 02/16/21 1107 02/16/21 1723 02/17/21 0731  GLUCAP 128* 114* 192* 100* 199*       Signed:  Desma Maxim MD.  [facility] 02/17/2021, 2:45 PM

## 2021-02-17 NOTE — TOC Benefit Eligibility Note (Signed)
Patient Teacher, English as a foreign language completed.    The patient is currently admitted and upon discharge could be taking Dificid 200 mg tablets.  The current 10 day co-pay is, $1,567.92.   The patient is currently admitted and upon discharge could be taking Vancomycin 125 mg capsules.  The current 10 day co-pay is, $100.00.   The patient is insured through [place], [place] Patient Advocate Specialist [place] Patient Advocate Team Direct Number: [phone]  Fax: [phone]

## 2021-02-17 NOTE — Evaluation (Signed)
Physical Therapy Evaluation Patient Details Name: Brittany [name] MRN: [id number] DOB: August 17, 1960 Today's Date: 02/17/2021  History of Present Illness  Brittany [name] is a 2yoF who comes to [facility] on 02/13/21 [name] body aches, anorexia, weakness. 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 Pt admitted with diverticulitis of intestine with abscess.  Clinical Impression  Pt admitted with above diagnosis. Pt currently with functional limitations due to the deficits listed below (see "PT Problem List"). Upon entry, pt in bed, awake and agreeable to participate. The pt is alert, pleasant, interactive, and able to provide info regarding prior level of function, both in tolerance and independence. Pt mobilizing in room ad ib now (per pt), feels much improved but still slightly off baseline particularly related to balance. Pt agreeable to HHPT at DC, no DME needs. Pt feels she can be safe with mobility at home. Patient's performance this date reveals decreased ability, independence, and tolerance in performing all basic mobility required for performance of activities of daily living. Pt requires additional DME and effort for safe participate in mobility. Pt will benefit from skilled PT intervention to increase independence and safety with basic mobility in preparation for discharge to the venue listed below.        Recommendations for follow up therapy are one component of a multi-disciplinary discharge planning process, led by the attending physician.  Recommendations may be updated based on patient status, additional functional criteria and insurance authorization.  Follow Up Recommendations Home health PT    Assistance Recommended at Discharge Set up Supervision/Assistance  Patient can return home with the following       Equipment Recommendations None recommended by PT   Recommendations for Other Services       Functional Status Assessment Patient has had a recent decline in their functional status and demonstrates the ability to make significant improvements in function in a reasonable and predictable amount of time.     Precautions / Restrictions Precautions Precautions: None      Mobility  Bed Mobility Overal bed mobility: Modified Independent                  Transfers Overall transfer level: Modified independent                 General transfer comment: No LOB    Ambulation/Gait Ambulation/Gait assistance: Supervision Gait Distance (Feet): 100 Feet Assistive device: None         General Gait Details: 18ft at bedside, 50% of which is retroAMB, no LOB noted. Remained in room for fear of soiling hallway floor.  Stairs            Wheelchair Mobility    Modified Rankin (Stroke Patients Only)       Balance                                             Pertinent Vitals/Pain      Home Living Family/patient expects to be discharged to:: Private residence Living Arrangements: Children;Other relatives Available Help at Discharge: Family;Available PRN/intermittently Type of Home: House Home Access: Stairs to enter Entrance Stairs-Rails: Psychiatric nurse of Steps: 2   Home Layout: One level Home Equipment: Cane - single point;Shower seat;Grab bars - tub/shower;Hand held shower head Additional  Comments: Pt with poor attention to task at hand on this date, minimal PLOF, home set up provided    Prior Function Prior Level of Function : Independent/Modified Independent;History of Falls (last six months) (1 fall since prior admission)             Mobility Comments: Pt reports being MOD-I for functional mobility with SPC ADLs Comments: Pt reports being independent with ADLs     Hand Dominance   Dominant Hand: Right    Extremity/Trunk Assessment                 Communication      Cognition Arousal/Alertness: Awake/alert Behavior During Therapy: WFL for tasks assessed/performed Overall Cognitive Status: Within Functional Limits for tasks assessed                                          General Comments      Exercises     Assessment/Plan    PT Assessment Patient needs continued PT services  PT Problem List Decreased activity tolerance;Decreased balance;Decreased mobility       PT Treatment Interventions Balance training;Gait training;Functional mobility training;Therapeutic activities;Therapeutic exercise    PT Goals (Current goals can be found in the Care Plan section)  Acute Rehab PT Goals Patient Stated Goal: regain baseline mobility and balance PT Goal Formulation: With patient Time For Goal Achievement: 03/03/21 Potential to Achieve Goals: Good    Frequency Min 2X/week     Co-evaluation               AM-PAC PT "6 Clicks" Mobility  Outcome Measure Help needed turning from your back to your side while in a flat bed without using bedrails?: None Help needed moving from lying on your back to sitting on the side of a flat bed without using bedrails?: None Help needed moving to and from a bed to a chair (including a wheelchair)?: None Help needed standing up from a chair using your arms (e.g., wheelchair or bedside chair)?: None Help needed to walk in hospital room?: A Little Help needed climbing 3-5 steps with a railing? : None 6 Click Score: 23    End of Session   Activity Tolerance: Patient tolerated treatment well;No increased pain Patient left: in bed;with call bell/phone within reach Nurse Communication: Mobility status PT Visit Diagnosis: Unsteadiness on feet (R26.81);Difficulty in walking, not elsewhere classified (R26.2);Other abnormalities of gait and mobility (R26.89)    Time: 2595-6387 PT Time Calculation (min) (ACUTE ONLY): 14 min   Charges:   PT Evaluation $PT Eval Low  Complexity: 1 Low         12:10 PM, 02/17/21 Etta Grandchild, PT, DPT Physical Therapist - [facility]  [phone] ([place])    Brittany [name] 02/17/2021, 12:07 PM

## 2021-02-17 NOTE — TOC Transition Note (Signed)
Transition of Care [facility]) - CM/SW Discharge Note   Patient Details  Name: Brittany [name] MRN: [id number] Date of Birth: 1960/08/16  [facility]) CM/SW Contact:  Magnus Ivan, LCSW Phone Number: 02/17/2021, 2:36 PM   Clinical Narrative:    Patient to DC home this afternoon.  Notified Brittany [name] with [employer]. Attempted to reach daughter Brittany [name], no answer and no VM. MD also has not been able to reach Brittany [name]. Called mother Brittany [name] who stated she will have patient's nephew come pick up patient around 3 pm to take her home. Updated care team. No additional TOC needs.    Final next level of care: [place] Barriers to Discharge: Barriers Resolved   Patient Goals and CMS Choice Patient states their goals for this hospitalization and ongoing recovery are:: home with home health CMS Medicare.gov Compare Post Acute Care list provided to:: Patient Choice offered to / list presented to : Patient, Adult Children  Discharge Placement                Patient to be transferred to facility by: nephew Name of family member notified: Brittany [name] Patient and family notified of of transfer: 02/17/21  Discharge Plan and Services                          HH Arranged: PT [facility] Agency: [place] Date [place]: 02/17/21   Representative spoke with at [place]: [place] ([place]) Interventions     Readmission Risk Interventions Readmission Risk Prevention Plan 02/16/2021  Transportation Screening Complete  Medication Review Press photographer) Complete  SW Recovery Care/Counseling Consult Complete  [place] Not Applicable  Some recent data might be hidden

## 2021-02-17 NOTE — Progress Notes (Signed)
[facility] SURGICAL PROGRESS NOTE (cpt [phone])  Hospital Day(s): 3.   Interval History: Patient seen and examined, no acute events or new complaints overnight. This morning, she reports that she is "feeling so much better". No fever, chills, nausea, emesis, or abdominal pain. Mild leukocytosis is now improving; down to 10.9K. Renal function remains normal; scr - 0.56; UO is unmeasured. No electrolyte derangements this morning. Cx from aspiration growing GPC, GPR, GNR. She continues on Zosyn. She is on full liquids; tolerating well. She is having bowel function. She is asking "for regular food because he is hungry."  Review of Systems:  Constitutional: denies fever, chills  HEENT: denies cough or congestion  Respiratory: denies any shortness of breath  Cardiovascular: denies chest pain or palpitations  Gastrointestinal: denied abdominal pain, denied N/V Genitourinary: denies burning with urination or urinary frequency Neurological: denied HA  Vital signs in last 24 hours: [min-max] current  Temp:  [97.9 F (36.6 C)-98.7 F (37.1 C)] 98.7 F (37.1 C) (01/05 2043) Pulse Rate:  [86-97] 92 (01/05 2043) Resp:  [16-18] 16 (01/05 2043) BP: (117-165)/(77-98) 117/77 (01/05 2043) SpO2:  [82 %-100 %] 99 % (01/05 2043)     Height: 5' (152.4 cm) Weight: 40.8 kg BMI (Calculated): 17.58   Intake/Output last 2 shifts:  01/05 0701 - 01/06 0700 In: 2082.6 [I.V.:1754.2; IV Piggyback:328.3] Out: -    Physical Exam:  Constitutional: alert, cooperative and no distress  HENT: normocephalic without obvious abnormality  Eyes: PERRL, EOM's grossly intact and symmetric  Respiratory: breathing non-labored at rest  Cardiovascular: regular rate and sinus rhythm  Gastrointestinal: Abdomen is soft, she does not appear tender this morning, non-distended, no rebound/guarding, she is certainly without evidence of peritonitis  Musculoskeletal: no edema or wounds, motor and sensation grossly intact,  NT    Labs:  CBC Latest Ref Rng & Units 02/17/2021 02/16/2021 02/15/2021  WBC 4.0 - 10.5 K/uL 10.9(H) 12.9(H) 10.2  Hemoglobin 12.0 - 15.0 g/dL 12.2 11.8(L) 12.8  Hematocrit 36.0 - 46.0 % 38.3 36.1 38.0  Platelets 150 - 400 K/uL 506(H) 521(H) 596(H)   CMP Latest Ref Rng & Units 02/17/2021 02/16/2021 02/15/2021  Glucose 70 - 99 mg/dL 191(H) 120(H) 128(H)  BUN 8 - 23 mg/dL <5(L) 7(L) 6(L)  Creatinine 0.44 - 1.00 mg/dL 0.56 0.56 0.38(L)  Sodium 135 - 145 mmol/L 139 133(L) 131(L)  Potassium 3.5 - 5.1 mmol/L 4.7 4.0 3.5  Chloride 98 - 111 mmol/L 107 99 95(L)  CO2 22 - 32 mmol/L 27 28 29   Calcium 8.9 - 10.3 mg/dL 8.5(L) 8.0(L) 8.2(L)  Total Protein 6.5 - 8.1 g/dL 6.9 6.6 -  Total Bilirubin 0.3 - 1.2 mg/dL 0.6 0.6 -  Alkaline Phos 38 - 126 U/L 85 85 -  AST 15 - 41 U/L 17 12(L) -  ALT 0 - 44 U/L 11 10 -     Imaging studies: No new pertinent imaging studies   Assessment/Plan: (ICD-10's: K34.92) 61 y.o. female with colitis/diverticulitis with abscess without pneumoperitoneum s/p aspiration on 00/86, complicated by DKA, hypokalemia, and known pancreatic cystic lesion (Hx of IPMN).    - I will transition her to regular diet this morning - Continue IV Abx (Zosyn); follow up aspiration cultures which are growing GPC, GPR, GNR  - Monitor leukocytosis; improving   - No need for emergent surgical intervention; We will continue to follow closely to ensure she does not deteriorate from abdominal standpoint   - Monitor abdominal examination; on-going bowel function - Pain control prn;  antiemetics prn  - Mobilize as tolerated; PT engaged             - Further management per primary service; we will follow     - Discharge Planning; She has made positive progress in the last 24 hours, diet advancing, leukocytosis returned to near normal, no pain. Anticipate ready for DC in next 24 hours pending continued progress; will need Abx for home.   All of the above findings and recommendations were discussed with  the patient, and the medical team, and all of patient's questions were answered to her expressed satisfaction.  -- Edison Simon, PA-C [facility] 02/17/2021, 7:26 AM [phone] M-F: 7am - 4pm

## 2021-02-17 NOTE — Progress Notes (Signed)
Brittany [name] to be D/C'd Home per MD order.  Discussed with the patient and all questions fully answered.  VSS, Skin clean, dry and intact without evidence of skin break down, no evidence of skin tears noted. IV catheter discontinued intact. Site without signs and symptoms of complications. Dressing and pressure applied.  An After Visit Summary was printed and given to the patient. Patient prescriptions sent to pharmacy.  D/c education completed with patient/family including follow up instructions, medication list, d/c activities limitations if indicated, with other d/c instructions as indicated by MD - patient able to verbalize understanding, all questions fully answered.   Patient instructed to return to ED, call 911, or call MD for any changes in condition.   Patient escorted via [place], and D/C home via private auto.  Manuella Ghazi 02/17/2021 3:15 PM

## 2021-02-17 NOTE — Care Management Important Message (Signed)
Important Message  Patient Details  Name: Brittany [name] MRN: [id number] Date of Birth: November 30, 1960   Medicare Important Message Given:  Yes  Medicare IM reviewed with patient via room phone due to isolation status.  Copy of Medicare IM placed in mail to home address on file.    [name] 02/17/2021, 2:25 PM

## 2021-02-17 NOTE — TOC Progression Note (Addendum)
Transition of Care [facility]) - Progression Note    Patient Details  Name: TYSON MASIN MRN: [id number] Date of Birth: Mar 27, 1960  Transition of Care [facility]) CM/SW Jeannette, LCSW Phone Number: 02/17/2021, 12:33 PM  Clinical Narrative:    Spoke to both patient and daughter [name] regarding HHPT rec. Both are agreeable to HHPT. No agency preference per patient. Patient says she had [place] in the past but could not recall agency used.  Referral made to [facility] with [facility]   Expected Discharge Plan: Home/Self Care Barriers to Discharge: Barriers Resolved  Expected Discharge Plan and Services Expected Discharge Plan: Home/Self Care       Living arrangements for the past 2 months: Single Family Home                                       Social Determinants of Health (SDOH) Interventions    Readmission Risk Interventions Readmission Risk Prevention Plan 02/16/2021  Transportation Screening Complete  Medication Review Press photographer) Complete  SW Recovery Care/Counseling Consult Complete  [place] Not Applicable  Some recent data might be hidden

## 2021-02-18 ENCOUNTER — Telehealth: Payer: Self-pay | Admitting: Obstetrics and Gynecology

## 2021-02-18 LAB — GLUCOSE, CAPILLARY: Glucose-Capillary: 249 mg/dL — ABNORMAL HIGH (ref 70–99)

## 2021-02-18 MED ORDER — SULFAMETHOXAZOLE-TRIMETHOPRIM 800-160 MG PO TABS: 1.0000 | ORAL_TABLET | Freq: Two times a day (BID) | ORAL | 0 refills | Status: AC

## 2021-02-18 NOTE — Telephone Encounter (Signed)
E coli growing from abscess aspiration. Will add bactrim to abx. Called pt's mother who will relay that message to the patient. Mother reports she's doing well.

## 2021-02-19 LAB — CULTURE, BLOOD (ROUTINE X 2)
Culture: NO GROWTH
Culture: NO GROWTH
Special Requests: ADEQUATE
Special Requests: ADEQUATE

## 2021-02-19 LAB — AEROBIC/ANAEROBIC CULTURE W GRAM STAIN (SURGICAL/DEEP WOUND)

## 2021-02-22 LAB — GLUCOSE, CAPILLARY: Glucose-Capillary: 495 mg/dL — ABNORMAL HIGH (ref 70–99)

## 2021-03-02 ENCOUNTER — Other Ambulatory Visit: Payer: Self-pay

## 2021-03-02 ENCOUNTER — Inpatient Hospital Stay
Admission: EM | Admit: 2021-03-02 | Discharge: 2021-03-14 | DRG: 329 | Disposition: A | Discharge status: Home Health | Payer: Medicare Other | Attending: Internal Medicine | Admitting: Internal Medicine

## 2021-03-02 ENCOUNTER — Emergency Department: Payer: Medicare Other

## 2021-03-02 DIAGNOSIS — Z881 Allergy status to other antibiotic agents status: Secondary | ICD-10-CM

## 2021-03-02 DIAGNOSIS — D490 Neoplasm of unspecified behavior of digestive system: Secondary | ICD-10-CM | POA: Diagnosis present

## 2021-03-02 DIAGNOSIS — Z8673 Personal history of transient ischemic attack (TIA), and cerebral infarction without residual deficits: Secondary | ICD-10-CM

## 2021-03-02 DIAGNOSIS — B359 Dermatophytosis, unspecified: Secondary | ICD-10-CM | POA: Diagnosis present

## 2021-03-02 DIAGNOSIS — L0291 Cutaneous abscess, unspecified: Secondary | ICD-10-CM

## 2021-03-02 DIAGNOSIS — K5792 Diverticulitis of intestine, part unspecified, without perforation or abscess without bleeding: Secondary | ICD-10-CM

## 2021-03-02 DIAGNOSIS — K862 Cyst of pancreas: Secondary | ICD-10-CM

## 2021-03-02 DIAGNOSIS — R54 Age-related physical debility: Secondary | ICD-10-CM | POA: Diagnosis present

## 2021-03-02 DIAGNOSIS — D649 Anemia, unspecified: Secondary | ICD-10-CM | POA: Diagnosis present

## 2021-03-02 DIAGNOSIS — E1165 Type 2 diabetes mellitus with hyperglycemia: Secondary | ICD-10-CM

## 2021-03-02 DIAGNOSIS — K5720 Diverticulitis of large intestine with perforation and abscess without bleeding: Secondary | ICD-10-CM | POA: Diagnosis not present

## 2021-03-02 DIAGNOSIS — Z20822 Contact with and (suspected) exposure to covid-19: Secondary | ICD-10-CM | POA: Diagnosis present

## 2021-03-02 DIAGNOSIS — F17210 Nicotine dependence, cigarettes, uncomplicated: Secondary | ICD-10-CM | POA: Diagnosis present

## 2021-03-02 DIAGNOSIS — A0472 Enterocolitis due to Clostridium difficile, not specified as recurrent: Secondary | ICD-10-CM | POA: Diagnosis present

## 2021-03-02 DIAGNOSIS — J449 Chronic obstructive pulmonary disease, unspecified: Secondary | ICD-10-CM | POA: Diagnosis present

## 2021-03-02 DIAGNOSIS — Z681 Body mass index (BMI) 19 or less, adult: Secondary | ICD-10-CM

## 2021-03-02 DIAGNOSIS — G2581 Restless legs syndrome: Secondary | ICD-10-CM | POA: Diagnosis present

## 2021-03-02 DIAGNOSIS — E739 Lactose intolerance, unspecified: Secondary | ICD-10-CM | POA: Diagnosis present

## 2021-03-02 DIAGNOSIS — L02214 Cutaneous abscess of groin: Secondary | ICD-10-CM | POA: Diagnosis present

## 2021-03-02 DIAGNOSIS — L930 Discoid lupus erythematosus: Secondary | ICD-10-CM | POA: Diagnosis present

## 2021-03-02 DIAGNOSIS — Z7982 Long term (current) use of aspirin: Secondary | ICD-10-CM

## 2021-03-02 DIAGNOSIS — R1084 Generalized abdominal pain: Secondary | ICD-10-CM | POA: Diagnosis not present

## 2021-03-02 DIAGNOSIS — E43 Unspecified severe protein-calorie malnutrition: Secondary | ICD-10-CM | POA: Diagnosis present

## 2021-03-02 DIAGNOSIS — I10 Essential (primary) hypertension: Secondary | ICD-10-CM | POA: Diagnosis present

## 2021-03-02 DIAGNOSIS — Z794 Long term (current) use of insulin: Secondary | ICD-10-CM

## 2021-03-02 DIAGNOSIS — K6819 Other retroperitoneal abscess: Secondary | ICD-10-CM | POA: Diagnosis present

## 2021-03-02 DIAGNOSIS — F32A Depression, unspecified: Secondary | ICD-10-CM

## 2021-03-02 DIAGNOSIS — Z888 Allergy status to other drugs, medicaments and biological substances status: Secondary | ICD-10-CM

## 2021-03-02 DIAGNOSIS — Z818 Family history of other mental and behavioral disorders: Secondary | ICD-10-CM

## 2021-03-02 DIAGNOSIS — Z9081 Acquired absence of spleen: Secondary | ICD-10-CM

## 2021-03-02 DIAGNOSIS — B9620 Unspecified Escherichia coli [E. coli] as the cause of diseases classified elsewhere: Secondary | ICD-10-CM | POA: Diagnosis present

## 2021-03-02 DIAGNOSIS — Z885 Allergy status to narcotic agent status: Secondary | ICD-10-CM

## 2021-03-02 DIAGNOSIS — R748 Abnormal levels of other serum enzymes: Secondary | ICD-10-CM

## 2021-03-02 DIAGNOSIS — E876 Hypokalemia: Secondary | ICD-10-CM | POA: Diagnosis not present

## 2021-03-02 DIAGNOSIS — Z79899 Other long term (current) drug therapy: Secondary | ICD-10-CM

## 2021-03-02 DIAGNOSIS — Z8619 Personal history of other infectious and parasitic diseases: Secondary | ICD-10-CM

## 2021-03-02 DIAGNOSIS — Z8507 Personal history of malignant neoplasm of pancreas: Secondary | ICD-10-CM

## 2021-03-02 DIAGNOSIS — Z803 Family history of malignant neoplasm of breast: Secondary | ICD-10-CM

## 2021-03-02 DIAGNOSIS — Z5320 Procedure and treatment not carried out because of patient's decision for unspecified reasons: Secondary | ICD-10-CM | POA: Diagnosis present

## 2021-03-02 LAB — CBC
HCT: 33.2 % — ABNORMAL LOW (ref 36.0–46.0)
Hemoglobin: 11 g/dL — ABNORMAL LOW (ref 12.0–15.0)
MCH: 30.2 pg (ref 26.0–34.0)
MCHC: 33.1 g/dL (ref 30.0–36.0)
MCV: 91.2 fL (ref 80.0–100.0)
Platelets: 552 10*3/uL — ABNORMAL HIGH (ref 150–400)
RBC: 3.64 MIL/uL — ABNORMAL LOW (ref 3.87–5.11)
RDW: 13.4 % (ref 11.5–15.5)
WBC: 8.7 10*3/uL (ref 4.0–10.5)
nRBC: 0 % (ref 0.0–0.2)

## 2021-03-02 LAB — COMPREHENSIVE METABOLIC PANEL
ALT: 15 U/L (ref 0–44)
AST: 15 U/L (ref 15–41)
Albumin: 2.1 g/dL — ABNORMAL LOW (ref 3.5–5.0)
Alkaline Phosphatase: 99 U/L (ref 38–126)
Anion gap: 10 (ref 5–15)
BUN: 10 mg/dL (ref 8–23)
CO2: 27 mmol/L (ref 22–32)
Calcium: 8 mg/dL — ABNORMAL LOW (ref 8.9–10.3)
Chloride: 97 mmol/L — ABNORMAL LOW (ref 98–111)
Creatinine, Ser: 0.51 mg/dL (ref 0.44–1.00)
GFR, Estimated: >60 mL/min (ref >60)
Glucose, Bld: 242 mg/dL — ABNORMAL HIGH (ref 70–99)
Potassium: 3.3 mmol/L — ABNORMAL LOW (ref 3.5–5.1)
Sodium: 134 mmol/L — ABNORMAL LOW (ref 135–145)
Total Bilirubin: 0.4 mg/dL (ref 0.3–1.2)
Total Protein: 6.8 g/dL (ref 6.5–8.1)

## 2021-03-02 LAB — RESP PANEL BY RT-PCR (FLU A&B, COVID) ARPGX2
Influenza A by PCR: NEGATIVE
Influenza B by PCR: NEGATIVE
SARS Coronavirus 2 by RT PCR: NEGATIVE

## 2021-03-02 LAB — LIPASE, BLOOD: Lipase: 254 U/L — ABNORMAL HIGH (ref 11–51)

## 2021-03-02 MED ORDER — IOHEXOL 300 MG/ML  SOLN
75.0000 mL | Freq: Once | INTRAMUSCULAR | Status: AC | PRN
  Administered 2021-03-02: 75 mL via INTRAVENOUS

## 2021-03-02 MED ORDER — ONDANSETRON HCL 4 MG/2ML IJ SOLN
4.0000 mg | Freq: Once | INTRAMUSCULAR | Status: AC
  Administered 2021-03-02: 4 mg via INTRAVENOUS
  Filled 2021-03-02: qty 2

## 2021-03-02 MED ORDER — LACTATED RINGERS IV BOLUS
1000.0000 mL | Freq: Once | INTRAVENOUS | Status: AC
Start: 2021-03-02 — End: 2021-03-02
  Administered 2021-03-02: 1000 mL via INTRAVENOUS

## 2021-03-02 MED ORDER — MORPHINE SULFATE (PF) 4 MG/ML IV SOLN
4.0000 mg | Freq: Once | INTRAVENOUS | Status: AC
  Administered 2021-03-02: 4 mg via INTRAVENOUS
  Filled 2021-03-02: qty 1

## 2021-03-02 NOTE — ED Notes (Signed)
Pt to CT

## 2021-03-02 NOTE — ED Triage Notes (Signed)
Pt comes into the ED via EMS from home with c/o abd pain for the past few days, denies N/V/d per EMS  163/95 95%RA HR83 [id number]

## 2021-03-02 NOTE — ED Provider Notes (Signed)
[facility] Provider Note    Event Date/Time   First MD Initiated Contact with Patient 03/02/21 2205     (approximate)   History   Abdominal Pain   HPI  Brittany [name] is a 61 y.o. female with a history of COPD, lupus, diabetes, hypertension who comes ED complaining of generalized abdominal pain for the last 2 days, severe, constant, no aggravating or alleviating factors.  No nausea vomiting or diarrhea.  Patient is crying due to pain.  Denies chest pain or shortness of breath.  Reviewing electronic medical record, patient was admitted 2 weeks ago due to complicated diverticulitis with abscess requiring drainage.  Patient also has a history of neoplasm in the pancreatic head and previous resection of the majority of her pancreas.     Physical Exam   Triage Vital Signs: ED Triage Vitals  Enc Vitals Group     BP 03/02/21 1452 (!) 146/91     Pulse Rate 03/02/21 1452 82     Resp 03/02/21 1452 18     Temp 03/02/21 1452 98.2 F (36.8 C)     Temp Source 03/02/21 1452 Oral     SpO2 03/02/21 1452 94 %     Weight --      Height --      Head Circumference --      Peak Flow --      Pain Score 03/02/21 2320 4     Pain Loc --      Pain Edu? --      Excl. in [place]? --     Most recent vital signs: Vitals:   03/02/21 2230 03/02/21 2300  BP: 138/89 (!) 151/87  Pulse: 81 80  Resp: 20 16  Temp:    SpO2: 99% 97%     General: Awake, no distress.  CV:  Good peripheral perfusion.  Regular rate and rhythm Resp:  Normal effort.  Clear to auscultation bilaterally Abd:  No distention.  Generalized tenderness with guarding Other:  No edema.  Dry mucous membranes.   ED Results / Procedures / Treatments   Labs (all labs ordered are listed, but only abnormal results are displayed) Labs Reviewed  LIPASE, BLOOD - Abnormal; Notable for the following components:      Result Value   Lipase 254 (*)    All other components within normal limits  COMPREHENSIVE  METABOLIC PANEL - Abnormal; Notable for the following components:   Sodium 134 (*)    Potassium 3.3 (*)    Chloride 97 (*)    Glucose, Bld 242 (*)    Calcium 8.0 (*)    Albumin 2.1 (*)    All other components within normal limits  CBC - Abnormal; Notable for the following components:   RBC 3.64 (*)    Hemoglobin 11.0 (*)    HCT 33.2 (*)    Platelets 552 (*)    All other components within normal limits  RESP PANEL BY RT-PCR (FLU A&B, COVID) [id number]  URINALYSIS, ROUTINE W REFLEX MICROSCOPIC     EKG Interpreted by me Normal sinus rhythm rate of 82.  Right axis.  Normal intervals.  Poor R wave progression.  Normal ST segments and T waves.  No acute ischemic changes.    RADIOLOGY CT abdomen pelvis viewed and interpreted by me, shows lobulated cystic mass at the area of the head of the pancreas.  Radiology report reviewed    PROCEDURES:  Critical Care performed: No  Procedures   MEDICATIONS  ORDERED IN ED: Medications  lactated ringers bolus 1,000 mL (1,000 mLs Intravenous New Bag/Given 03/02/21 2244)  ondansetron (ZOFRAN) injection 4 mg (4 mg Intravenous Given 03/02/21 2245)  morphine 4 MG/ML injection 4 mg (4 mg Intravenous Given 03/02/21 2245)  iohexol (OMNIPAQUE) 300 MG/ML solution 75 mL (75 mLs Intravenous Contrast Given 03/02/21 2312)     IMPRESSION / MDM / ASSESSMENT AND PLAN / ED COURSE  I reviewed the triage vital signs and the nursing notes.                              Differential diagnosis includes, but is not limited to, pancreatitis, diverticulitis, abdominal abscess, GI perforation, bowel obstruction.     Patient presents with severe generalized abdominal pain with tenderness.  Vital signs unremarkable, not septic.  Lipase is elevated which appears to be a chronic finding for her.  Other labs unremarkable.  Will give IV morphine 4 mg for pain relief, IV fluids for hydration.  Will obtain CT abdomen pelvis.      FINAL CLINICAL IMPRESSION(S) / ED  DIAGNOSES   Final diagnoses:  Generalized abdominal pain     Rx / DC Orders   ED Discharge Orders     None        Note:  This document was prepared using Dragon voice recognition software and may include unintentional dictation errors.   Carrie Mew, MD 03/07/21 1524

## 2021-03-03 ENCOUNTER — Other Ambulatory Visit (HOSPITAL_COMMUNITY): Payer: Self-pay

## 2021-03-03 ENCOUNTER — Inpatient Hospital Stay: Payer: Medicare Other

## 2021-03-03 DIAGNOSIS — K6819 Other retroperitoneal abscess: Secondary | ICD-10-CM | POA: Diagnosis present

## 2021-03-03 DIAGNOSIS — L02214 Cutaneous abscess of groin: Secondary | ICD-10-CM | POA: Diagnosis present

## 2021-03-03 DIAGNOSIS — A0472 Enterocolitis due to Clostridium difficile, not specified as recurrent: Secondary | ICD-10-CM

## 2021-03-03 DIAGNOSIS — K5720 Diverticulitis of large intestine with perforation and abscess without bleeding: Secondary | ICD-10-CM | POA: Diagnosis present

## 2021-03-03 DIAGNOSIS — K862 Cyst of pancreas: Secondary | ICD-10-CM | POA: Diagnosis present

## 2021-03-03 DIAGNOSIS — Z803 Family history of malignant neoplasm of breast: Secondary | ICD-10-CM | POA: Diagnosis not present

## 2021-03-03 DIAGNOSIS — E1165 Type 2 diabetes mellitus with hyperglycemia: Secondary | ICD-10-CM | POA: Diagnosis present

## 2021-03-03 DIAGNOSIS — G2581 Restless legs syndrome: Secondary | ICD-10-CM | POA: Diagnosis present

## 2021-03-03 DIAGNOSIS — Z8619 Personal history of other infectious and parasitic diseases: Secondary | ICD-10-CM

## 2021-03-03 DIAGNOSIS — E43 Unspecified severe protein-calorie malnutrition: Secondary | ICD-10-CM | POA: Diagnosis present

## 2021-03-03 DIAGNOSIS — R748 Abnormal levels of other serum enzymes: Secondary | ICD-10-CM

## 2021-03-03 DIAGNOSIS — B359 Dermatophytosis, unspecified: Secondary | ICD-10-CM | POA: Diagnosis present

## 2021-03-03 DIAGNOSIS — B9620 Unspecified Escherichia coli [E. coli] as the cause of diseases classified elsewhere: Secondary | ICD-10-CM | POA: Diagnosis present

## 2021-03-03 DIAGNOSIS — Z881 Allergy status to other antibiotic agents status: Secondary | ICD-10-CM | POA: Diagnosis not present

## 2021-03-03 DIAGNOSIS — Z818 Family history of other mental and behavioral disorders: Secondary | ICD-10-CM | POA: Diagnosis not present

## 2021-03-03 DIAGNOSIS — J9600 Acute respiratory failure, unspecified whether with hypoxia or hypercapnia: Secondary | ICD-10-CM | POA: Diagnosis not present

## 2021-03-03 DIAGNOSIS — F17210 Nicotine dependence, cigarettes, uncomplicated: Secondary | ICD-10-CM | POA: Diagnosis present

## 2021-03-03 DIAGNOSIS — E876 Hypokalemia: Secondary | ICD-10-CM | POA: Diagnosis not present

## 2021-03-03 DIAGNOSIS — Z8507 Personal history of malignant neoplasm of pancreas: Secondary | ICD-10-CM | POA: Diagnosis not present

## 2021-03-03 DIAGNOSIS — L02416 Cutaneous abscess of left lower limb: Secondary | ICD-10-CM | POA: Diagnosis not present

## 2021-03-03 DIAGNOSIS — R1084 Generalized abdominal pain: Secondary | ICD-10-CM | POA: Diagnosis present

## 2021-03-03 DIAGNOSIS — L0291 Cutaneous abscess, unspecified: Secondary | ICD-10-CM | POA: Diagnosis not present

## 2021-03-03 DIAGNOSIS — F320 Major depressive disorder, single episode, mild: Secondary | ICD-10-CM | POA: Diagnosis not present

## 2021-03-03 DIAGNOSIS — J449 Chronic obstructive pulmonary disease, unspecified: Secondary | ICD-10-CM | POA: Diagnosis present

## 2021-03-03 DIAGNOSIS — L930 Discoid lupus erythematosus: Secondary | ICD-10-CM | POA: Diagnosis present

## 2021-03-03 DIAGNOSIS — R54 Age-related physical debility: Secondary | ICD-10-CM | POA: Diagnosis present

## 2021-03-03 DIAGNOSIS — K5792 Diverticulitis of intestine, part unspecified, without perforation or abscess without bleeding: Secondary | ICD-10-CM | POA: Diagnosis not present

## 2021-03-03 DIAGNOSIS — I10 Essential (primary) hypertension: Secondary | ICD-10-CM | POA: Diagnosis present

## 2021-03-03 DIAGNOSIS — D649 Anemia, unspecified: Secondary | ICD-10-CM | POA: Diagnosis present

## 2021-03-03 DIAGNOSIS — Z9081 Acquired absence of spleen: Secondary | ICD-10-CM | POA: Diagnosis not present

## 2021-03-03 DIAGNOSIS — J189 Pneumonia, unspecified organism: Secondary | ICD-10-CM | POA: Diagnosis not present

## 2021-03-03 DIAGNOSIS — Z681 Body mass index (BMI) 19 or less, adult: Secondary | ICD-10-CM | POA: Diagnosis not present

## 2021-03-03 DIAGNOSIS — E119 Type 2 diabetes mellitus without complications: Secondary | ICD-10-CM | POA: Diagnosis not present

## 2021-03-03 DIAGNOSIS — Z20822 Contact with and (suspected) exposure to covid-19: Secondary | ICD-10-CM | POA: Diagnosis present

## 2021-03-03 HISTORY — DX: Personal history of other infectious and parasitic diseases: Z86.19

## 2021-03-03 LAB — COMPREHENSIVE METABOLIC PANEL
ALT: 13 U/L (ref 0–44)
AST: 11 U/L — ABNORMAL LOW (ref 15–41)
Albumin: 2.1 g/dL — ABNORMAL LOW (ref 3.5–5.0)
Alkaline Phosphatase: 95 U/L (ref 38–126)
Anion gap: 6 (ref 5–15)
BUN: 7 mg/dL — ABNORMAL LOW (ref 8–23)
CO2: 30 mmol/L (ref 22–32)
Calcium: 7.9 mg/dL — ABNORMAL LOW (ref 8.9–10.3)
Chloride: 99 mmol/L (ref 98–111)
Creatinine, Ser: 0.43 mg/dL — ABNORMAL LOW (ref 0.44–1.00)
GFR, Estimated: >60 mL/min (ref >60)
Glucose, Bld: 95 mg/dL (ref 70–99)
Potassium: 2.9 mmol/L — ABNORMAL LOW (ref 3.5–5.1)
Sodium: 135 mmol/L (ref 135–145)
Total Bilirubin: 0.4 mg/dL (ref 0.3–1.2)
Total Protein: 6.7 g/dL (ref 6.5–8.1)

## 2021-03-03 LAB — CBC
HCT: 29.7 % — ABNORMAL LOW (ref 36.0–46.0)
Hemoglobin: 9.7 g/dL — ABNORMAL LOW (ref 12.0–15.0)
MCH: 29.5 pg (ref 26.0–34.0)
MCHC: 32.7 g/dL (ref 30.0–36.0)
MCV: 90.3 fL (ref 80.0–100.0)
Platelets: 529 10*3/uL — ABNORMAL HIGH (ref 150–400)
RBC: 3.29 MIL/uL — ABNORMAL LOW (ref 3.87–5.11)
RDW: 13.3 % (ref 11.5–15.5)
WBC: 9.9 10*3/uL (ref 4.0–10.5)
nRBC: 0 % (ref 0.0–0.2)

## 2021-03-03 LAB — GLUCOSE, CAPILLARY
Glucose-Capillary: 134 mg/dL — ABNORMAL HIGH (ref 70–99)
Glucose-Capillary: 136 mg/dL — ABNORMAL HIGH (ref 70–99)
Glucose-Capillary: 69 mg/dL — ABNORMAL LOW (ref 70–99)
Glucose-Capillary: 135 mg/dL — ABNORMAL HIGH (ref 70–99)
Glucose-Capillary: 128 mg/dL — ABNORMAL HIGH (ref 70–99)
Glucose-Capillary: 169 mg/dL — ABNORMAL HIGH (ref 70–99)

## 2021-03-03 LAB — CBG MONITORING, ED: Glucose-Capillary: 305 mg/dL — ABNORMAL HIGH (ref 70–99)

## 2021-03-03 MED ORDER — BOOST / RESOURCE BREEZE PO LIQD CUSTOM
1.0000 | Freq: Three times a day (TID) | ORAL | Status: DC
  Administered 2021-03-06 – 2021-03-09 (×5): 1 via ORAL

## 2021-03-03 MED ORDER — ONDANSETRON HCL 4 MG/2ML IJ SOLN: 4.0000 mg | Freq: Four times a day (QID) | INTRAMUSCULAR | Status: DC | PRN

## 2021-03-03 MED ORDER — DEXTROSE 50 % IV SOLN
INTRAVENOUS | Status: AC
  Filled 2021-03-03: qty 50

## 2021-03-03 MED ORDER — DEXTROSE 50 % IV SOLN
12.5000 g | Freq: Once | INTRAVENOUS | Status: AC
  Administered 2021-03-03: 09:00:00 12.5 g via INTRAVENOUS

## 2021-03-03 MED ORDER — VANCOMYCIN HCL 125 MG PO CAPS
125.0000 mg | ORAL_CAPSULE | Freq: Four times a day (QID) | ORAL | Status: AC
  Administered 2021-03-03 – 2021-03-12 (×37): 125 mg via ORAL
  Filled 2021-03-03 (×43): qty 1

## 2021-03-03 MED ORDER — SODIUM CHLORIDE 0.9 % IV SOLN: INTRAVENOUS | Status: DC

## 2021-03-03 MED ORDER — MORPHINE SULFATE (PF) 2 MG/ML IV SOLN
2.0000 mg | INTRAVENOUS | Status: DC | PRN
  Administered 2021-03-03 (×3): 2 mg via INTRAVENOUS
  Filled 2021-03-03 (×3): qty 1

## 2021-03-03 MED ORDER — ACETAMINOPHEN 325 MG PO TABS
650.0000 mg | ORAL_TABLET | Freq: Four times a day (QID) | ORAL | Status: DC | PRN
  Administered 2021-03-05 – 2021-03-07 (×3): 650 mg via ORAL
  Filled 2021-03-03 (×3): qty 2

## 2021-03-03 MED ORDER — ONDANSETRON HCL 4 MG PO TABS: 4.0000 mg | ORAL_TABLET | Freq: Four times a day (QID) | ORAL | Status: DC | PRN

## 2021-03-03 MED ORDER — POTASSIUM CHLORIDE CRYS ER 20 MEQ PO TBCR
40.0000 meq | EXTENDED_RELEASE_TABLET | ORAL | Status: AC
  Administered 2021-03-03 (×2): 40 meq via ORAL
  Filled 2021-03-03 (×2): qty 2

## 2021-03-03 MED ORDER — PIPERACILLIN-TAZOBACTAM 3.375 G IVPB 30 MIN
3.3750 g | Freq: Once | INTRAVENOUS | Status: AC
  Administered 2021-03-03: 3.375 g via INTRAVENOUS
  Filled 2021-03-03: qty 50

## 2021-03-03 MED ORDER — HYDROCODONE-ACETAMINOPHEN 5-325 MG PO TABS
1.0000 | ORAL_TABLET | ORAL | Status: DC | PRN
  Administered 2021-03-03 – 2021-03-04 (×4): 2 via ORAL
  Administered 2021-03-04 – 2021-03-09 (×9): 1 via ORAL
  Filled 2021-03-03 (×3): qty 1
  Filled 2021-03-03: qty 2
  Filled 2021-03-03: qty 1
  Filled 2021-03-03: qty 2
  Filled 2021-03-03 (×2): qty 1
  Filled 2021-03-03: qty 2
  Filled 2021-03-03: qty 1
  Filled 2021-03-03 (×2): qty 2
  Filled 2021-03-03: qty 1

## 2021-03-03 MED ORDER — ACETAMINOPHEN 650 MG RE SUPP: 650.0000 mg | Freq: Four times a day (QID) | RECTAL | Status: DC | PRN

## 2021-03-03 MED ORDER — ROPINIROLE HCL 1 MG PO TABS
1.0000 mg | ORAL_TABLET | Freq: Two times a day (BID) | ORAL | Status: DC
  Administered 2021-03-03 – 2021-03-14 (×20): 1 mg via ORAL
  Filled 2021-03-03 (×20): qty 1

## 2021-03-03 MED ORDER — INSULIN ASPART 100 UNIT/ML IJ SOLN
0.0000 [IU] | INTRAMUSCULAR | Status: DC
  Administered 2021-03-03: 03:00:00 7 [IU] via SUBCUTANEOUS
  Administered 2021-03-03 (×2): 1 [IU] via SUBCUTANEOUS
  Administered 2021-03-03: 2 [IU] via SUBCUTANEOUS
  Administered 2021-03-04: 3 [IU] via SUBCUTANEOUS
  Filled 2021-03-03 (×6): qty 1

## 2021-03-03 NOTE — TOC Benefit Eligibility Note (Signed)
Patient Teacher, English as a foreign language completed.    The patient is currently admitted and upon discharge could be taking Dificid 200 mg tablets.  The current 30 day co-pay is, $1,588.10.   The patient is insured through [place], [place] Patient Advocate Specialist [place] Patient Advocate Team Direct Number: [phone]  Fax: [phone]

## 2021-03-03 NOTE — ED Notes (Signed)
Pt reminded about her urine specimen. Pt states that she used the restroom prior to coming back to the ED room. A specimen hat placed in the commode for the patient to obtain specimen at another time.

## 2021-03-03 NOTE — Progress Notes (Signed)
IR procedure request - intra abdominal abscess aspiration    61 y.o. female inpatient. 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 Presented to the ED at [facility] on 1.19.22 with abd pain. CT Abd pelvis from 1.19.23.  No lekocystosis. No cultures. No fevers. She is on 81 mg of ASA per home list.   IR consulted for possible intra abdominal abscess drain placement. Case has been reviewed.  US guided abscess aspiration approved by Dr. Denna Haggard using local sedation.  Patient tentatively scheduled for 1.20.23  IR will call patient when ready.

## 2021-03-03 NOTE — H&P (Signed)
History and Physical    Brittany [name] LAG:[id number] DOB: Sep 23, 1960 DOA: 03/02/2021  PCP: Clinic, [facility]   Patient coming from: home  I have personally briefly reviewed patient's relevant medical records in [place]  Chief Complaint: vomiting and diarrhea  HPI: Brittany [name] is a 61 y.o. 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  She denies nausea and vomiting.  She reports diarrhea for the past 3 to 4 weeks and was  C. difficile PCR positive on 02/14/2021 treated with vancomycin.  States she continues to have several episodes of watery diarrhea daily with no improvement.  She denies fever or chills  ED course: BP 146/91 with otherwise normal vitals Blood work WBC normal at 8.7 with hemoglobin 11 LFTs WNL, lipase 254 Glucose 242  EKG, personally viewed and interpreted: NSR at 92 with nonspecific ST-T wave changes  Imaging: CT abdomen and pelvis IMPRESSION: 1. Redemonstration of a masslike thickened 7.3 x 5.3 cm the distal descending/proximal sigmoid in the setting of underlying diverticulosis with associated persistent 2.7 x 1.6 x 1.1 cm intramural abscess formation consistent with complicated acute diverticulitis. Persistent irregular bowel thickening of the rectosigmoid colon distally. An underlying malignancy is not excluded. Recommend surgical consultation. Recommend colonoscopy status post treatment and status post complete resolution of inflammatory changes to exclude an underlying lesion. 2. Interval  increase in size of an adjacent 3.7 x 2.6 x 6.8 cm (from 3 x 1.7 x 3.4cm) extraperitoneal abscess formation that appears to extend into the abdominal musculature. 3. Please consider the use of both PO and IV contrast in short-term future CT scans. 4. Similar-appearing 4.5 x 3.4 cm multiloculated cystic mass within the head/uncinate process of the pancreas with associated main pancreatic duct dilatation distally. 5.  Aortic Atherosclerosis (ICD10-I70.0).   The ED provider spoke with surgeon on-call Dr. Hampton Abbot who recommended admission to the medicine service with surgical consult to arrange for IR drainage Patient started on Zosyn.  Hospitalist consulted for admission.   Review of Systems: As per HPI otherwise all other systems on review of systems negative.   Assessment/Plan    Colonic diverticular abscess, recurrent -IV Zosyn, pain control, IV antiemetics - IV hydration - IR consult for drainage of abscess - We will keep n.p.o. - SCD for DVT prophylaxis  Suspect persistent C. difficile diarrhea Clostridium difficile infection 02/14/2021 ?recurrent/persistent - We will resume oral vancomycin for extended course -Consider ID consult for additional recommendations    Hyperglycemia due to type 2 diabetes mellitus ([place]) - IV hydration and sliding scale insulin coverage    Discoid lupus erythematosus On immunosuppressive medication - Currently on CellCept.  Will hold.    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 This is judged to be reasonable and necessary in order to provide the required intensity of service to ensure the patient's safety given the presenting symptoms, physical exam findings, and initial radiographic and laboratory data in the context of their  Comorbid conditions.   Patient requires inpatient status due to high intensity of service, high risk for further deterioration and high frequency of surveillance required.   I certify that at the point of admission it is my clinical judgment that the patient will require inpatient hospital care spanning beyond 2 midnights     Physical Exam: Vitals:   03/02/21 2230 03/02/21 2300 03/03/21 0000 03/03/21 0030  BP: 138/89 (!) 151/87 (!) 137/92 (!) 139/95  Pulse: 81 80 80 79  Resp: 20 16 18 19   Temp:      TempSrc:      SpO2: 99% 97% 95% 97%   Constitutional: Frail-appearing, oriented x 3 . Not in any apparent distress HEENT:      Head: Normocephalic and atraumatic.         Eyes: PERLA, EOMI, Conjunctivae are normal. Sclera is non-icteric.       Mouth/Throat: Mucous membranes are moist.       Neck: Supple with no signs of meningismus. Cardiovascular: Regular rate and rhythm. No murmurs, gallops, or rubs. 2+ symmetrical distal pulses are present . No JVD. No  LE edema Respiratory: Respiratory effort normal .Lungs sounds clear bilaterally. No wheezes, crackles, or rhonchi.  Gastrointestinal: Soft, tender in left lower quadrant and lower abdomen, non distended. Positive bowel sounds.  Genitourinary: No CVA tenderness. Musculoskeletal: Nontender with normal range of motion in all extremities. No cyanosis, or erythema of extremities. Neurologic:  Face is symmetric. Moving all extremities. No gross focal neurologic deficits . Skin: Skin is warm, dry.  No rash or ulcers Psychiatric: Mood and affect are appropriate     Past Medical History:  Diagnosis Date   Cancer ([place])     Collagen vascular disease ([place])    Diabetes mellitus without complication ([place])    Hypertension    Lupus ([place])     Past Surgical History:  Procedure Laterality Date   PANCREATECTOMY     spleenectomy       reports that she has been smoking. She has never used smokeless tobacco. She reports current alcohol use. She reports that she does not currently use drugs.  Allergies  Allergen Reactions   Cephalosporins Itching   Hydromorphone Itching   Keflin [Cephalothin] Itching   Lactose Intolerance (Gi) Diarrhea    Family History  Problem Relation Age of Onset   Anxiety disorder Sister    Breast cancer Maternal Aunt       Prior to Admission medications   Medication Sig Start Date End Date Taking? Authorizing Provider  amoxicillin-clavulanate (AUGMENTIN) 875-125 MG tablet Take 1 tablet by mouth 3 (three) times daily. 02/17/21   Wouk, Ailene Rud, MD  aspirin 81 MG chewable tablet Chew 1 tablet (81 mg total) by mouth daily. Patient not taking: Reported on 12/25/2020 07/20/19   Sharen Hones, MD  atorvastatin (LIPITOR) 80 MG tablet Take 1 tablet (80 mg total) by mouth Nightly. 11/08/20 12/25/20  Pokhrel, Corrie Mckusick, MD  benazepril (LOTENSIN) 10 MG tablet Take 10 mg by mouth daily.    [provider]  diclofenac Sodium (VOLTAREN) 1 % GEL Apply 2 g topically 4 (four) times daily. 01/09/21   [provider]  feeding supplement, GLUCERNA SHAKE, (GLUCERNA SHAKE) LIQD Take 237 mLs by mouth 3 (three) times daily between meals. 12/27/20   Loletha Grayer, MD  gabapentin (NEURONTIN) 600 MG tablet Take 1 tablet (600 mg total) by mouth at bedtime. Patient taking differently: Take 1,800 mg by mouth at bedtime. 08/04/19   Love, Ivan Anchors, PA-C  insulin NPH-regular Human (NOVOLIN 70/30) (70-30) 100 UNIT/ML injection Inject 28 Units into the skin 2 (two) times daily with a meal. Patient taking differently: Inject 30 Units into the skin daily with breakfast. 08/04/19   Love, Ivan Anchors, PA-C   mirtazapine (REMERON) 15 MG tablet Take 1 tablet (15 mg total) by mouth at bedtime. 11/08/20 11/08/21  Pokhrel, Corrie Mckusick, MD  Pancrelipase, Lip-Prot-Amyl, 24000-76000 units CPEP Take 3 capsules (72,000 Units total) by mouth 3 (three) times daily. Patient taking differently: Take 3 capsules by mouth 3 (three) times daily. Appears to be noncompliant 12/27/20   Loletha Grayer, MD  potassium chloride SA (KLOR-CON M) 20 MEQ tablet Take 2 tablets by mouth daily. Patient not taking: Reported on 02/16/2021 01/09/21 01/09/22  [provider]  rOPINIRole (REQUIP) 1 MG tablet Take 1 tablet (1 mg total) by mouth 2 (two) times daily. Patient taking differently: Take 0.5 mg by mouth at bedtime. 08/04/19   Love, Ivan Anchors, PA-C  vancomycin (VANCOCIN) 125 MG capsule Take 1 capsule (125 mg total) by mouth 4 (four) times daily. 02/17/21   Wouk, Ailene Rud, MD      Labs on Admission: I have personally reviewed following labs and imaging studies  CBC: Recent Labs  Lab 03/02/21 1448  WBC 8.7  HGB 11.0*  HCT 33.2*  MCV 91.2  PLT 458*   Basic Metabolic Panel: Recent Labs  Lab 03/02/21 1448  NA 134*  K 3.3*  CL 97*  CO2 27  GLUCOSE 242*  BUN 10  CREATININE 0.51  CALCIUM 8.0*   GFR: CrCl cannot be calculated (Unknown ideal weight.). Liver Function Tests: Recent Labs  Lab 03/02/21 1448  AST 15  ALT 15  ALKPHOS 99  BILITOT 0.4  PROT 6.8  ALBUMIN 2.1*   Recent Labs  Lab 03/02/21 1448  LIPASE 254*   No results for input(s): AMMONIA in the last 168 hours. Coagulation Profile: No results for input(s): INR, PROTIME in the last 168 hours. Cardiac Enzymes: No results for input(s): CKTOTAL, CKMB, CKMBINDEX, TROPONINI in the last 168 hours. BNP (last 3 results) No results for input(s): PROBNP in the last 8760 hours. HbA1C: No results for input(s): HGBA1C in the last 72 hours. CBG: No results for input(s): GLUCAP in the last 168 hours. Lipid Profile: No results for input(s): CHOL,  HDL, LDLCALC, TRIG, CHOLHDL, LDLDIRECT in the last 72 hours. Thyroid Function Tests: No results for input(s): TSH, T4TOTAL, FREET4, T3FREE, THYROIDAB in the last 72 hours. Anemia Panel: No results for input(s): VITAMINB12, FOLATE, FERRITIN, TIBC, IRON, RETICCTPCT in the last 72 hours. Urine analysis:    Component Value Date/Time   COLORURINE STRAW (A) 02/14/2021 1647   APPEARANCEUR CLEAR (A) 02/14/2021 1647   APPEARANCEUR Hazy 04/04/2011 1935   LABSPEC 1.016 02/14/2021 1647   LABSPEC >1.060 04/04/2011 1935   PHURINE 5.0 02/14/2021 1647   GLUCOSEU >=500 (A) 02/14/2021 1647   GLUCOSEU 50 mg/dL 04/04/2011 1935   HGBUR NEGATIVE 02/14/2021 1647   BILIRUBINUR NEGATIVE 02/14/2021 1647   BILIRUBINUR Negative 04/04/2011 1935   KETONESUR 5 (A) 02/14/2021 [place] 02/14/2021 1647   NITRITE NEGATIVE 02/14/2021 1647  LEUKOCYTESUR NEGATIVE 02/14/2021 1647   LEUKOCYTESUR Negative 04/04/2011 1935    Radiological Exams on Admission: CT ABDOMEN PELVIS W CONTRAST  Result Date: 03/02/2021 CLINICAL DATA:  Peritonitis or perforation suspected Abdominal pain, acute, nonlocalized EXAM: CT ABDOMEN AND PELVIS WITH CONTRAST TECHNIQUE: Multidetector CT imaging of the abdomen and pelvis was performed using the standard protocol following bolus administration of intravenous contrast. RADIATION DOSE REDUCTION: This exam was performed according to the departmental dose-optimization program which includes automated exposure control, adjustment of the mA and/or kV according to patient size and/or use of iterative reconstruction technique. CONTRAST:  66mL OMNIPAQUE IOHEXOL 300 MG/ML  SOLN COMPARISON:  CT abdomen pelvis 02/14/2021, MRI abdomen 02/14/2021 FINDINGS: Lower chest: Limited evaluation due to respiratory motion artifact. Subsegmental atelectasis. No gross acute abnormality. Hepatobiliary: No focal liver abnormality. Status post cholecystectomy. No biliary dilatation. Pancreas: Similar-appearing  4.5 x 3.4 cm multiloculated cystic mass within the head/uncinate process of the pancreas with associated main pancreatic duct dilatation distally. Distal pancreas resection. Spleen: Splenectomy. Adrenals/Urinary Tract: No adrenal nodule bilaterally. Bilateral kidneys enhance symmetrically. Bilateral subcentimeter hypodensities too small to characterize. No hydronephrosis. No hydroureter. The urinary bladder is unremarkable. On delayed imaging, there is no urothelial wall thickening and there are no filling defects in the opacified portions of the bilateral collecting systems or ureters. Stomach/Bowel: Stomach is within normal limits. No evidence of small bowel wall thickening or dilatation. Colonic diverticulosis. Redemonstration of a masslike thickened 7.3 x 5.3 cm the distal descending/proximal sigmoid (2:56) in the setting of underlying diverticulosis with associated intramural abscess formation measuring 2.7 x 1.6 x 1.1 cm (2:60, 5:23). Irregular bowel thickening of the rectosigmoid colon distally (2:66). Slight interval increase in size of an adjacent gas and fluid collection that is peripherally enhancing measuring 3.7 x 2.6 x 6.8 cm (from 3 x 1.7 3.4cm). The abscess appears to be extraperitoneal and then extend to the abdominal musculature. Appendix appears normal. 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 The main portal and superior mesenteric veins are patent. No abdominal aorta or iliac aneurysm. Severe atherosclerotic plaque of the aorta and its branches. No abdominal, pelvic, or inguinal lymphadenopathy. Reproductive: Uterus and bilateral adnexa are unremarkable. Other: No intraperitoneal free fluid. No intraperitoneal free gas. Musculoskeletal: Subcutaneus soft tissue edema. No suspicious lytic or blastic osseous lesions. No acute displaced fracture. IMPRESSION: 1. Redemonstration of a masslike thickened 7.3 x 5.3 cm the distal descending/proximal sigmoid  in the setting of underlying diverticulosis with associated persistent 2.7 x 1.6 x 1.1 cm intramural abscess formation consistent with complicated acute diverticulitis. Persistent irregular bowel thickening of the rectosigmoid colon distally. An underlying malignancy is not excluded. Recommend surgical consultation. Recommend colonoscopy status post treatment and status post complete resolution of inflammatory changes to exclude an underlying lesion. 2. Interval increase in size of an adjacent 3.7 x 2.6 x 6.8 cm (from 3 x 1.7 x 3.4cm) extraperitoneal abscess formation that appears to extend into the abdominal musculature. 3. Please consider the use of both PO and IV contrast in short-term future CT scans. 4. Similar-appearing 4.5 x 3.4 cm multiloculated cystic mass within the head/uncinate process of the pancreas with associated main pancreatic duct dilatation distally. 5.  Aortic Atherosclerosis (ICD10-I70.0). Electronically Signed   By: Iven Finn M.D.   On: 03/02/2021 23:57       Athena Masse MD [facility]   03/03/2021, 1:25 AM

## 2021-03-03 NOTE — Procedures (Signed)
Interventional Radiology Procedure Note  Date of Procedure: 03/03/2021  Procedure: Abscess aspiration   Findings:  1. Successful US guided aspiration of 8 ml purulent material from left inguinal extraperitoneal abscess    Complications: No immediate complications noted.   Estimated Blood Loss: minimal  Follow-up and Recommendations: 1. Follow up cultures    Albin Felling, MD  Vascular & Interventional Radiology  03/03/2021 10:22 AM

## 2021-03-03 NOTE — Progress Notes (Signed)
Initial Nutrition Assessment  DOCUMENTATION CODES:  Not applicable  INTERVENTION:  Continue to advance diet to GI soft as liquids are tolerated, add double protein to meals Encourage PO intake Boost Breeze po TID, each supplement provides 250 kcal and 9 grams of protein  NUTRITION DIAGNOSIS:  Inadequate oral intake related to diarrhea, decreased appetite as evidenced by per patient/family report.  GOAL:  Patient will meet greater than or equal to 90% of their needs  MONITOR:  PO intake, Supplement acceptance, Weight trends, Diet advancement  REASON FOR ASSESSMENT:  Malnutrition Screening Tool    ASSESSMENT:  61 y/o female with hx of DM type 2, pancreatic cancer s/p pancreatectomy (80% removed) & splenectomy, RLS, HLD, peripheral neuropathy, GERD, COPD, hx CVA, and substance abuse presented to ED with diarrhea x 3-4 weeks at home after recent admission at [facility] 1/3-1/6. Imaging suggestive of a recurrent colonic diverticular abscess.   Surgery team recommended IR consult for possible draining of abscess.    1/20 - 21mL drained from left inguinal extraperitoneal abscess    Attempted to call patient on room phone, no answer at this time. Confirmed with RN that pt had returned to room from radiology department. Diet advanced to clear liquids after return. Pt recently admitted and diagnosed with severe malnutrition. Reviewed previous RD notes, pt refused milk-based nutrition supplements citing a lactose intolerance. Pt had requested double proteins during last admission and boost breeze. Will reinitiate nutrition interventions. Pt did report good appetite during last admission.    Noted pt has had a 5.8% weight loss noted in the last 3 months (10/24-1/20), 11.8% in the last year (03/13/20-03/03/21). Weight loss is not significant, but is concerning due to pt's hx of chronic illnesses and cancer.  Nutritionally Relevant Medications: Scheduled Meds:  insulin aspart  0-9 Units Subcutaneous Q4H    vancomycin  125 mg Oral QID   Continuous Infusions:  sodium chloride 125 mL/hr at 03/03/21 0159   PRN Meds: ondansetron  Labs Reviewed: Potassium: 2.9 BUN/ Creatinine: 7 / .43   NUTRITION - FOCUSED PHYSICAL EXAM: (Defer new exam to in-person assessment, recent exam performed 1/6) Flowsheet Row Most Recent Value  Orbital Region Moderate depletion  Upper Arm Region Severe depletion  Thoracic and Lumbar Region Severe depletion  Buccal Region Moderate depletion  Temple Region Moderate depletion  Clavicle Bone Region Severe depletion  Clavicle and Acromion Bone Region Severe depletion  Scapular Bone Region Severe depletion  Dorsal Hand Severe depletion  Patellar Region Severe depletion  Anterior Thigh Region Severe depletion  Posterior Calf Region Severe depletion  Edema (RD Assessment) None  Hair Reviewed  Eyes Reviewed  Mouth Reviewed  Skin Reviewed  Nails Reviewed   Diet Order:   Diet Order             Diet clear liquid Room service appropriate? Yes; Fluid consistency: Thin  Diet effective now                   EDUCATION NEEDS:  No education needs have been identified at this time  Skin:  Skin Assessment: Reviewed RN Assessment  Last BM:  1/19 - per RN report  Height:  Ht Readings from Last 1 Encounters:  03/03/21 5' (1.524 m)    Weight:  Wt Readings from Last 1 Encounters:  03/03/21 44 kg    Ideal Body Weight:  45.5 kg  BMI:  Body mass index is 18.94 kg/m.  Estimated Nutritional Needs:  Kcal:  1300-1500 kcal/d Protein:  65-75 g/d  Fluid:  1.3-1.5 L/d   Ranell Patrick, RD, LDN Clinical Dietitian I RD pager # available in [place]  After hours/weekend pager # available in [facility]

## 2021-03-03 NOTE — Consult Note (Signed)
Patient ID: Brittany [name], female   DOB: 1960-10-19, 61 y.o.   MRN: [id number]  HPI Brittany [name] is a 61 y.o. female well-known to [place] with a history of diverticular abscess recently hospitalized and treated with antibiotic and aspiration.  She did well. The scan personally review showing evidence of diverticulitis with a contained descending and sigmoid colon there is also a separate retroperitoneal abscess measuring 3.7 x 5.3 cms. Addition to this she has a small intramural abscess. Comes last night with abdominal pain that has been present for the last couple of days it is intermittent colicky type and diffuse.  No specific alleviating factors or aggravating factors. Also had a history of C. difficile colitis as well. He has had diarrhea for the last few weeks. WBC normal at 8.7 with hemoglobin 11 K 2.9 creat .43 Albumin 2.1 LFTs WNL, lipase 254 Glucose 242 Known pancreatic pathologic cystic lesion followed at [facility] and prior hx of distal pancreatectomy and splenectomy. she has been followed at [facility] for known pancreatic cystic changes. She had EUS in January 2021 at [facility] with Dr Cephas Darby. thIs morning she feels much better.  She is hungry.  No nausea no vomiting and no abdominal pain. Also had an aspiration showing 8 cc of pus. She has a history of lupus and is currently immunosuppressed   HPI  Past Medical History:  Diagnosis Date   Cancer ([place])    Collagen vascular disease ([place])    Diabetes mellitus without complication ([place])    Hypertension    Lupus ([place])     Past Surgical History:  Procedure Laterality Date   PANCREATECTOMY     spleenectomy      Family History  Problem Relation Age of Onset   Anxiety disorder Sister    Breast cancer Maternal Aunt     Social History Social History   Tobacco Use   Smoking status: Every Day   Smokeless tobacco: Never  Substance Use Topics   Alcohol use: Yes   Drug use: Not Currently    Allergies  Allergen Reactions    Cephalosporins Itching   Hydromorphone Itching   Keflin [Cephalothin] Itching   Lactose Intolerance (Gi) Diarrhea    Current Facility-Administered Medications  Medication Dose Route Frequency Provider Last Rate Last Admin   0.9 %  sodium chloride infusion   Intravenous Continuous Athena Masse, MD 125 mL/hr at 03/03/21 0159 New Bag at 03/03/21 0159   acetaminophen (TYLENOL) tablet 650 mg  650 mg Oral Q6H PRN Athena Masse, MD       Or   acetaminophen (TYLENOL) suppository 650 mg  650 mg Rectal Q6H PRN Athena Masse, MD       feeding supplement (BOOST / RESOURCE BREEZE) liquid 1 Container  1 Container Oral TID BM Richarda Osmond, MD       HYDROcodone-acetaminophen (NORCO/VICODIN) 5-325 MG per tablet 1-2 tablet  1-2 tablet Oral Q4H PRN Athena Masse, MD       insulin aspart (novoLOG) injection 0-9 Units  0-9 Units Subcutaneous Q4H Judd Gaudier V, MD   1 Units at 03/03/21 1225   morphine 2 MG/ML injection 2 mg  2 mg Intravenous Q2H PRN Athena Masse, MD   2 mg at 03/03/21 0542   ondansetron (ZOFRAN) tablet 4 mg  4 mg Oral Q6H PRN Athena Masse, MD       Or   ondansetron [facility]) injection 4 mg  4 mg Intravenous Q6H PRN Athena Masse,  MD       potassium chloride SA (KLOR-CON M) CR tablet 40 mEq  40 mEq Oral Q4H Richarda Osmond, MD       vancomycin (VANCOCIN) capsule 125 mg  125 mg Oral QID Athena Masse, MD   125 mg at 03/03/21 1030     Review of Systems Full ROS  was asked and was negative except for the information on the HPI  Physical Exam Blood pressure (!) 130/94, pulse 80, temperature 98.2 F (36.8 C), temperature source Oral, resp. rate 16, height 5' (1.524 m), weight 44 kg, SpO2 99 %. CONSTITUTIONAL: malnourished NAD, in good spirits. EYES: Pupils are equal, round, Sclera are non-icteric. EARS, NOSE, MOUTH AND THROAT: She is wearing a mask. Hearing is intact to voice. LYMPH NODES:  Lymph nodes in the neck are normal. RESPIRATORY:  Lungs are clear. There  is normal respiratory effort, with equal breath sounds bilaterally, and without pathologic use of accessory muscles. CARDIOVASCULAR: Heart is regular without murmurs, gallops, or rubs. GI: The abdomen is  soft, nontender, and nondistended. There are no palpable masses. There is no hepatosplenomegaly. There are normal bowel sounds in all quadrants. GU: Rectal deferred.   MUSCULOSKELETAL: Normal muscle strength and tone. No cyanosis or edema.   SKIN: Turgor is good and there are no pathologic skin lesions or ulcers. NEUROLOGIC: Motor and sensation is grossly normal. Cranial nerves are grossly intact. PSYCH:  Oriented to person, place and time. Affect is normal.  Data Reviewed  I have personally reviewed the patient's imaging, laboratory findings and medical records.    Assessment/Plan 61 year old female with recurrent diverticular abscess as well as concomitant C. difficile colitis.  A malignancy cannot be excluded but this is more infectious labs and that they were plastic. Tinea antibiotic therapy.  I will continue to follow her with serial abdominal exams.  May do liquid diet for now.  No need for emergent surgical intervention. Likely benefit from a repeat CT scan in 72 hours to make sure things are okay. Note that I spent over 75 minutes in this encounter including personally reviewing imaging studies, counseling the patient, coordinating her care and performing appropriate documentation  Caroleen Hamman, MD FACS General Surgeon 03/03/2021, 1:55 PM

## 2021-03-03 NOTE — Progress Notes (Signed)
°  INTERVAL PROGRESS NOTE    Brittany [name]- 61 y.o. female  LOS: 0 __________________________________________________________________  SUBJECTIVE: Admitted 03/02/2021 with cc of LLQ pain Chief Complaint  Patient presents with   Abdominal Pain   Since admission, patient has had improvement in her pain  OBJECTIVE: Blood pressure (!) 133/92, pulse 70, temperature 97.9 F (36.6 C), temperature source Oral, resp. rate 18, SpO2 98 %.  General: NAD, pleasant, able to participate in exam Cardiac: RRR, normal heart sounds Respiratory: CTAB, normal effort, No wheezes, rales or rhonchi Abdomen: soft, nondistended, no hepatic or splenomegaly, +BS. Tenderness diffusely but primarily LLQ to palpation Extremities: no edema. WWP. Skin: warm and dry, no rashes noted Neuro: alert and oriented, no focal deficits Psych: Normal affect and mood   ASSESSMENT/PLAN:  I have reviewed the full H&P by Dr. Damita Dunnings, and I agree with the assessment and plan as outlined therein. In addition:  IR- US guided aspiration of superficial abscess with culture to guide Abx management  GS- serial exams and potentially a repeat CT scan to monitor progression of infection vs malignant contribution  Continue IV Abx Monitor fever curve Starting clear liquid diet Analgesia and antiemetics PRN   Principal Problem:   Colonic diverticular abscess Active Problems:   Discoid lupus erythematosus   Essential hypertension   IPMN (intraductal papillary mucinous neoplasm)   Restless leg syndrome   Depression   Pancreatic cyst   Elevated lipase   History of Clostridium difficile colitis   Hyperglycemia due to type 2 diabetes mellitus ([place])   Richarda Osmond, DO [facility] 03/03/2021, 7:02 AM    [email or web address] Available by Epic secure chat 7AM-7PM. If 7PM-7AM, please contact night-coverage   No Charge

## 2021-03-03 NOTE — ED Provider Notes (Signed)
I started patient on Zosyn due to concern for worsening abscess associate with diverticulitis. I discussed the incidental findings on CT and need to follow-up for colonoscopy.  She expressed understanding.  Discuss with Dr. Hampton Abbot recommends admission to hospitalist and they will talk to IR in morning about placing drain.         Vanessa Winona, MD 03/03/21 [phone]

## 2021-03-04 DIAGNOSIS — G2581 Restless legs syndrome: Secondary | ICD-10-CM

## 2021-03-04 DIAGNOSIS — A0472 Enterocolitis due to Clostridium difficile, not specified as recurrent: Secondary | ICD-10-CM | POA: Diagnosis not present

## 2021-03-04 DIAGNOSIS — F320 Major depressive disorder, single episode, mild: Secondary | ICD-10-CM

## 2021-03-04 DIAGNOSIS — R748 Abnormal levels of other serum enzymes: Secondary | ICD-10-CM

## 2021-03-04 DIAGNOSIS — E1165 Type 2 diabetes mellitus with hyperglycemia: Secondary | ICD-10-CM

## 2021-03-04 DIAGNOSIS — Z8619 Personal history of other infectious and parasitic diseases: Secondary | ICD-10-CM

## 2021-03-04 DIAGNOSIS — Z794 Long term (current) use of insulin: Secondary | ICD-10-CM

## 2021-03-04 DIAGNOSIS — K5720 Diverticulitis of large intestine with perforation and abscess without bleeding: Secondary | ICD-10-CM | POA: Diagnosis not present

## 2021-03-04 DIAGNOSIS — I10 Essential (primary) hypertension: Secondary | ICD-10-CM

## 2021-03-04 DIAGNOSIS — L0291 Cutaneous abscess, unspecified: Secondary | ICD-10-CM

## 2021-03-04 DIAGNOSIS — L930 Discoid lupus erythematosus: Secondary | ICD-10-CM

## 2021-03-04 DIAGNOSIS — K5792 Diverticulitis of intestine, part unspecified, without perforation or abscess without bleeding: Secondary | ICD-10-CM

## 2021-03-04 DIAGNOSIS — K862 Cyst of pancreas: Secondary | ICD-10-CM

## 2021-03-04 DIAGNOSIS — D490 Neoplasm of unspecified behavior of digestive system: Secondary | ICD-10-CM

## 2021-03-04 LAB — CBC
HCT: 30.7 % — ABNORMAL LOW (ref 36.0–46.0)
Hemoglobin: 9.9 g/dL — ABNORMAL LOW (ref 12.0–15.0)
MCH: 29.7 pg (ref 26.0–34.0)
MCHC: 32.2 g/dL (ref 30.0–36.0)
MCV: 92.2 fL (ref 80.0–100.0)
Platelets: 550 10*3/uL — ABNORMAL HIGH (ref 150–400)
RBC: 3.33 MIL/uL — ABNORMAL LOW (ref 3.87–5.11)
RDW: 13.7 % (ref 11.5–15.5)
WBC: 8.5 10*3/uL (ref 4.0–10.5)
nRBC: 0 % (ref 0.0–0.2)

## 2021-03-04 LAB — COMPREHENSIVE METABOLIC PANEL
ALT: 13 U/L (ref 0–44)
AST: 17 U/L (ref 15–41)
Albumin: 2.2 g/dL — ABNORMAL LOW (ref 3.5–5.0)
Alkaline Phosphatase: 93 U/L (ref 38–126)
Anion gap: 9 (ref 5–15)
BUN: 5 mg/dL — ABNORMAL LOW (ref 8–23)
CO2: 27 mmol/L (ref 22–32)
Calcium: 8.2 mg/dL — ABNORMAL LOW (ref 8.9–10.3)
Chloride: 103 mmol/L (ref 98–111)
Creatinine, Ser: 0.55 mg/dL (ref 0.44–1.00)
GFR, Estimated: >60 mL/min (ref >60)
Glucose, Bld: 90 mg/dL (ref 70–99)
Potassium: 3.9 mmol/L (ref 3.5–5.1)
Sodium: 139 mmol/L (ref 135–145)
Total Bilirubin: 0.4 mg/dL (ref 0.3–1.2)
Total Protein: 6.6 g/dL (ref 6.5–8.1)

## 2021-03-04 LAB — GLUCOSE, CAPILLARY
Glucose-Capillary: 181 mg/dL — ABNORMAL HIGH (ref 70–99)
Glucose-Capillary: 182 mg/dL — ABNORMAL HIGH (ref 70–99)
Glucose-Capillary: 206 mg/dL — ABNORMAL HIGH (ref 70–99)
Glucose-Capillary: 216 mg/dL — ABNORMAL HIGH (ref 70–99)
Glucose-Capillary: 248 mg/dL — ABNORMAL HIGH (ref 70–99)
Glucose-Capillary: 87 mg/dL (ref 70–99)
Glucose-Capillary: 93 mg/dL (ref 70–99)

## 2021-03-04 LAB — LIPASE, BLOOD: Lipase: 40 U/L (ref 11–51)

## 2021-03-04 MED ORDER — PIPERACILLIN-TAZOBACTAM 3.375 G IVPB
3.3750 g | Freq: Three times a day (TID) | INTRAVENOUS | Status: DC
  Administered 2021-03-04 – 2021-03-12 (×24): 3.375 g via INTRAVENOUS
  Filled 2021-03-04 (×24): qty 50

## 2021-03-04 MED ORDER — PANCRELIPASE (LIP-PROT-AMYL) 12000-38000 UNITS PO CPEP
12000.0000 [IU] | ORAL_CAPSULE | Freq: Three times a day (TID) | ORAL | Status: DC
  Administered 2021-03-04 – 2021-03-05 (×4): 12000 [IU] via ORAL
  Filled 2021-03-04 (×7): qty 1

## 2021-03-04 MED ORDER — MIRTAZAPINE 15 MG PO TABS
15.0000 mg | ORAL_TABLET | Freq: Every day | ORAL | Status: DC
  Administered 2021-03-04 – 2021-03-13 (×9): 15 mg via ORAL
  Filled 2021-03-04 (×9): qty 1

## 2021-03-04 MED ORDER — GABAPENTIN 600 MG PO TABS
600.0000 mg | ORAL_TABLET | Freq: Every day | ORAL | Status: DC
  Administered 2021-03-04 – 2021-03-09 (×5): 600 mg via ORAL
  Filled 2021-03-04 (×5): qty 1

## 2021-03-04 MED ORDER — HYDROCORTISONE 0.5 % EX CREA
TOPICAL_CREAM | CUTANEOUS | Status: DC | PRN
  Filled 2021-03-04 (×2): qty 28.35

## 2021-03-04 MED ORDER — BENAZEPRIL HCL 10 MG PO TABS
10.0000 mg | ORAL_TABLET | Freq: Every day | ORAL | Status: DC
  Administered 2021-03-04 – 2021-03-06 (×3): 10 mg via ORAL
  Filled 2021-03-04 (×4): qty 1

## 2021-03-04 NOTE — Progress Notes (Signed)
Pharmacy Antibiotic Note  Brittany [name] is a 61 y.o. female presenting with abdominal pain. Admitted on 03/02/2021 with  intraabdominal infection .  Pharmacy has been consulted for Zosyn dosing.  PMH includes lupus, cancer, DM, HTN with WBC 8.5. CT abdomen showing colonic diverticulosis. Also with left inguinal abscess, s/p aspiration.   Noted cephalosporin allergy with itching. Received pip/tazo x 1 1/20 @0045 . Scr stable and consistent with baseline.   Plan: Zosyn 3.375g IV q8h (4 hour infusion). Monitor renal function, cultures, clinical course.   Height: 5' (152.4 cm) Weight: 44 kg (97 lb) IBW/kg (Calculated) : 45.5  Temp (24hrs), Avg:98.1 F (36.7 C), Min:97.5 F (36.4 C), Max:98.4 F (36.9 C)  Recent Labs  Lab 03/02/21 1448 03/03/21 0636 03/04/21 0516  WBC 8.7 9.9 8.5  CREATININE 0.51 0.43* 0.55    Estimated Creatinine Clearance: 51.3 mL/min (by C-G formula based on SCr of 0.55 mg/dL).    Allergies  Allergen Reactions   Cephalosporins Itching   Hydromorphone Itching   Keflin [Cephalothin] Itching   Lactose Intolerance (Gi) Diarrhea    Antimicrobials this admission: Zosyn 1/20 >> 1/20   Dose adjustments this admission: N/a  Microbiology results: Left inguinal abscess: few gram variable rod, few gram positive cocci, rare yeast   Thank you for allowing pharmacy to be a part of this patients care.  Wynelle Cleveland, PharmD Pharmacy Resident  03/04/2021 9:37 AM

## 2021-03-04 NOTE — Progress Notes (Signed)
PROGRESS NOTE  Brittany [name]    DOB: Dec 19, 1960, 61 y.o.  ZHY:[id number]  PCP: [facility]   Code Status: Full Code   DOA: 03/02/2021   LOS: 1  Brief Narrative of Current Hospitalization  Brittany [name] is a 61 y.o. 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  C. difficile PCR positive on 02/14/2021 treated with vancomycin. They presented from home to the ED on 03/02/2021 with LLQ pain associated with N/V and diarrhea x several days. In the ED, it was found that they had recurrent colonic diverticular abscess as seen on abdominal CT. They were treated with IV zosyn, analgesia, antiemetics and supportive care. She was restarted on oral vancomycin for continued C.diff.  1/20 underwent percutaneous drainage of superficial abscess by IR.  Patient was admitted to medicine service for further workup and management of recurrent colonic diverticular abscess as outlined in detail below.  03/04/21 -stable  Assessment & Plan  Principal Problem:   Colonic diverticular abscess Active Problems:   Discoid lupus erythematosus   Essential hypertension   IPMN (intraductal papillary mucinous neoplasm)   Restless leg syndrome   Depression   Pancreatic cyst   Elevated lipase   History of Clostridium difficile colitis   Hyperglycemia due to type 2 diabetes mellitus ([place])  Colonic diverticular abscess, recurrent- s/p abscess aspiration. Cultures pending. -IV Zosyn, pain control, IV antiemetics - repeat abdominal CT 1/22   Suspect persistent C. difficile diarrhea Clostridium difficile infection 02/14/2021 ?recurrent/persistent - We will resume oral vancomycin for  extended course -Consider ID consult for additional recommendations     Hyperglycemia due to type 2 diabetes mellitus ([place])- borderline hypoglycemia.  - Holding insulin until better PO     Discoid lupus erythematosus- no home medication     Essential hypertension -Continue benazepril     IPMN (intraductal papillary mucinous neoplasm) s/p partial pancreatectomy and splenectomy - Followed by oncology with serial CTs - Continue Pancrease     Restless leg syndrome - Continue ropinirole   Elevated lipase Pancreatic cyst - Stable cyst on CT of pancreas  DVT prophylaxis: SCDs Start: 03/03/21 0145   Diet:  Diet Orders (From admission, onward)     Start     Ordered   03/03/21 1121  Diet clear liquid Room service appropriate? Yes; Fluid consistency: Thin  Diet effective now       Question Answer Comment  Room service appropriate? Yes   Fluid consistency: Thin      03/03/21 1120            Subjective 03/04/21    Pt reports feeling OK today. She was able to tolerate liquid diet and is agreeable to advancing today. Her abdominal pain has improved. She had 2 Bms yesterday.   Disposition Plan & Communication  Patient status: Inpatient  Admitted From: Home Disposition: Home Anticipated discharge date: TBD  Family Communication: none  Consults, Procedures, Significant Events  Consultants:  IR General surgery  Procedures/significant events:  Percutaneous drainage of abscess 1/20  Antimicrobials:  Anti-infectives (From admission, onward)    Start     Dose/Rate Route Frequency Ordered Stop   03/03/21 1000  vancomycin (VANCOCIN) capsule 125 mg  125 mg Oral 4 times daily 03/03/21 0440 03/13/21 0959   03/03/21 0045  piperacillin-tazobactam (ZOSYN) IVPB 3.375 g        3.375 g 100 mL/hr over 30 Minutes Intravenous  Once 03/03/21 0031 03/03/21 0115       Objective   Vitals:   03/03/21 1528 03/03/21 2006 03/04/21 0010 03/04/21 0421  BP: 133/79 (!) 164/105 (!)  148/94 (!) 158/93  Pulse: 81 84 69 63  Resp: 16 20 18 16   Temp: 98.1 F (36.7 C) 98.4 F (36.9 C) 98.2 F (36.8 C) (!) 97.5 F (36.4 C)  TempSrc:  Oral Oral Oral  SpO2: 99% 100% 100% 100%  Weight:      Height:        Intake/Output Summary (Last 24 hours) at 03/04/2021 0550 Last data filed at 03/03/2021 2200 Gross per 24 hour  Intake 1352.92 ml  Output --  Net 1352.92 ml   Filed Weights   03/03/21 0217  Weight: 44 kg    Patient BMI: Body mass index is 18.94 kg/m.   Physical Exam:  General: awake, alert, NAD HEENT: atraumatic, clear conjunctiva, anicteric sclera, MMM, hearing grossly normal Respiratory: normal respiratory effort. Cardiovascular: normal S1/S2, RRR, no JVD, murmurs, quick capillary refill  Gastrointestinal: soft, NT, ND Nervous: A&O x3. no gross focal neurologic deficits, normal speech Extremities: moves all equally, no edema, normal tone Skin: dry, intact, normal temperature, normal color. No rashes, lesions or ulcers on exposed skin Psychiatry: tearful  Labs   I have personally reviewed following labs and imaging studies Admission on 03/02/2021  Component Date Value Ref Range Status   Lipase 03/02/2021 254 (H)  11 - 51 U/L Final   Sodium 03/02/2021 134 (L)  135 - 145 mmol/L Final   Potassium 03/02/2021 3.3 (L)  3.5 - 5.1 mmol/L Final   Chloride 03/02/2021 97 (L)  98 - 111 mmol/L Final   CO2 03/02/2021 27  22 - 32 mmol/L Final   Glucose, Bld 03/02/2021 242 (H)  70 - 99 mg/dL Final   BUN 03/02/2021 10  8 - 23 mg/dL Final   Creatinine, Ser 03/02/2021 0.51  0.44 - 1.00 mg/dL Final   Calcium 03/02/2021 8.0 (L)  8.9 - 10.3 mg/dL Final   Total Protein 03/02/2021 6.8  6.5 - 8.1 g/dL Final   Albumin 03/02/2021 2.1 (L)  3.5 - 5.0 g/dL Final   AST 03/02/2021 15  15 - 41 U/L Final   ALT 03/02/2021 15  0 - 44 U/L Final   Alkaline Phosphatase 03/02/2021 99  38 - 126 U/L Final   Total Bilirubin 03/02/2021 0.4  0.3 - 1.2 mg/dL Final   GFR, Estimated 03/02/2021  >60  >60 mL/min Final   Anion gap 03/02/2021 10  5 - 15 Final   WBC 03/02/2021 8.7  4.0 - 10.5 K/uL Final   RBC 03/02/2021 3.64 (L)  3.87 - 5.11 MIL/uL Final   Hemoglobin 03/02/2021 11.0 (L)  12.0 - 15.0 g/dL Final   HCT 03/02/2021 33.2 (L)  36.0 - 46.0 % Final   MCV 03/02/2021 91.2  80.0 - 100.0 fL Final   MCH 03/02/2021 30.2  26.0 - 34.0 pg Final   MCHC 03/02/2021 33.1  30.0 - 36.0 g/dL Final   RDW 03/02/2021 13.4  11.5 - 15.5 % Final   Platelets 03/02/2021 552 (H)  150 - 400 K/uL Final   nRBC 03/02/2021 0.0  0.0 - 0.2 % Final   SARS Coronavirus 2 by RT PCR 03/02/2021 NEGATIVE  NEGATIVE Final   Influenza A by PCR 03/02/2021 NEGATIVE  NEGATIVE Final   Influenza B by PCR 03/02/2021 NEGATIVE  NEGATIVE Final   Sodium 03/03/2021 135  135 - 145 mmol/L Final   Potassium 03/03/2021 2.9 (L)  3.5 - 5.1 mmol/L Final   Chloride 03/03/2021 99  98 - 111 mmol/L Final   CO2 03/03/2021 30  22 - 32 mmol/L Final   Glucose, Bld 03/03/2021 95  70 - 99 mg/dL Final   BUN 03/03/2021 7 (L)  8 - 23 mg/dL Final   Creatinine, Ser 03/03/2021 0.43 (L)  0.44 - 1.00 mg/dL Final   Calcium 03/03/2021 7.9 (L)  8.9 - 10.3 mg/dL Final   Total Protein 03/03/2021 6.7  6.5 - 8.1 g/dL Final   Albumin 03/03/2021 2.1 (L)  3.5 - 5.0 g/dL Final   AST 03/03/2021 11 (L)  15 - 41 U/L Final   ALT 03/03/2021 13  0 - 44 U/L Final   Alkaline Phosphatase 03/03/2021 95  38 - 126 U/L Final   Total Bilirubin 03/03/2021 0.4  0.3 - 1.2 mg/dL Final   GFR, Estimated 03/03/2021 >60  >60 mL/min Final   Anion gap 03/03/2021 6  5 - 15 Final   WBC 03/03/2021 9.9  4.0 - 10.5 K/uL Final   RBC 03/03/2021 3.29 (L)  3.87 - 5.11 MIL/uL Final   Hemoglobin 03/03/2021 9.7 (L)  12.0 - 15.0 g/dL Final   HCT 03/03/2021 29.7 (L)  36.0 - 46.0 % Final   MCV 03/03/2021 90.3  80.0 - 100.0 fL Final   MCH 03/03/2021 29.5  26.0 - 34.0 pg Final   MCHC 03/03/2021 32.7  30.0 - 36.0 g/dL Final   RDW 03/03/2021 13.3  11.5 - 15.5 % Final   Platelets 03/03/2021 529  (H)  150 - 400 K/uL Final   nRBC 03/03/2021 0.0  0.0 - 0.2 % Final   Glucose-Capillary 03/03/2021 305 (H)  70 - 99 mg/dL Final   Glucose-Capillary 03/03/2021 134 (H)  70 - 99 mg/dL Final   Glucose-Capillary 03/03/2021 69 (L)  70 - 99 mg/dL Final   Glucose-Capillary 03/03/2021 136 (H)  70 - 99 mg/dL Final   Specimen Description 03/03/2021    Final                   Value:ABSCESS Performed at [facility], [address] [place], [place] [address]    Special Requests 03/03/2021    Final                   Value:LEFT INGUINAL Performed at [facility], [address] [place], [place] [address]    Gram Stain 03/03/2021    Final                   Value:ABUNDANT WBC PRESENT, PREDOMINANTLY PMN FEW GRAM VARIABLE ROD RARE GRAM POSITIVE COCCI RARE YEAST Performed at [facility], [place] [address], [place], [place] [address]    Culture 03/03/2021 PENDING   Incomplete   Report Status 03/03/2021 PENDING   Incomplete   Glucose-Capillary 03/03/2021 135 (H)  70 - 99 mg/dL Final   Glucose-Capillary 03/03/2021 128 (H)  70 - 99 mg/dL Final   Glucose-Capillary 03/03/2021 169 (H)  70 - 99 mg/dL Final   Glucose-Capillary 03/04/2021 206 (H)  70 - 99 mg/dL Final   Glucose-Capillary 03/04/2021 87  70 - 99 mg/dL Final    Imaging Studies  CT ABDOMEN PELVIS W CONTRAST  Result Date: 03/02/2021 CLINICAL DATA:  Peritonitis or perforation suspected Abdominal pain, acute, nonlocalized EXAM: CT ABDOMEN AND PELVIS WITH CONTRAST TECHNIQUE: Multidetector CT imaging of the abdomen and pelvis was performed using the standard protocol following bolus administration of intravenous contrast. RADIATION DOSE REDUCTION: This exam was performed according to the departmental dose-optimization program which includes automated exposure control, adjustment of the mA and/or kV according to patient size and/or use of iterative reconstruction technique. CONTRAST:  8mL OMNIPAQUE IOHEXOL 300 MG/ML  SOLN  COMPARISON:  CT abdomen pelvis 02/14/2021, MRI abdomen 02/14/2021 FINDINGS: Lower chest: Limited evaluation due to respiratory motion artifact. Subsegmental atelectasis. No gross acute abnormality. Hepatobiliary: No focal liver abnormality. Status post cholecystectomy. No biliary dilatation. Pancreas: Similar-appearing 4.5 x 3.4 cm multiloculated cystic mass within the head/uncinate process of the pancreas with associated main pancreatic duct dilatation distally. Distal pancreas resection. Spleen: Splenectomy. Adrenals/Urinary Tract: No adrenal nodule bilaterally. Bilateral kidneys enhance symmetrically. Bilateral subcentimeter hypodensities too small to characterize. No hydronephrosis. No hydroureter. The urinary bladder is unremarkable. On delayed imaging, there is no urothelial wall thickening and there are no filling defects in the opacified portions of the bilateral collecting systems or ureters. Stomach/Bowel: Stomach is within normal limits. No evidence of small bowel wall thickening or dilatation. Colonic diverticulosis. Redemonstration of a masslike thickened 7.3 x 5.3 cm the distal descending/proximal sigmoid (2:56) in the setting of underlying diverticulosis with associated intramural abscess formation measuring 2.7 x 1.6 x 1.1 cm (2:60, 5:23). Irregular bowel thickening of the rectosigmoid colon distally (2:66). Slight interval increase in size of an adjacent gas and fluid collection that is peripherally enhancing measuring 3.7 x 2.6 x 6.8 cm (from 3 x 1.7 3.4cm). The abscess appears to be extraperitoneal and then extend to the abdominal musculature. Appendix appears normal. 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 The main portal and superior mesenteric veins are patent. No abdominal aorta or iliac aneurysm. Severe atherosclerotic plaque of the aorta and its branches. No abdominal, pelvic, or inguinal lymphadenopathy. Reproductive: Uterus and bilateral  adnexa are unremarkable. Other: No intraperitoneal free fluid. No intraperitoneal free gas. Musculoskeletal: Subcutaneus soft tissue edema. No suspicious lytic or blastic osseous lesions. No acute displaced fracture. IMPRESSION: 1. Redemonstration of a masslike thickened 7.3 x 5.3 cm the distal descending/proximal sigmoid in the setting of underlying diverticulosis with associated persistent 2.7 x 1.6 x 1.1 cm intramural abscess formation consistent with complicated acute diverticulitis. Persistent irregular bowel thickening of the rectosigmoid colon distally. An underlying malignancy is not excluded. Recommend surgical consultation. Recommend colonoscopy status post treatment and status post complete resolution of inflammatory changes to exclude an underlying lesion. 2. Interval increase in size of an adjacent 3.7 x 2.6 x 6.8 cm (from 3 x 1.7 x 3.4cm) extraperitoneal abscess formation that appears to extend into the abdominal musculature. 3. Please consider the use of both PO and IV contrast in short-term future CT scans. 4. Similar-appearing 4.5 x 3.4 cm multiloculated cystic mass within the head/uncinate process of the pancreas with associated main pancreatic duct dilatation distally. 5.  Aortic Atherosclerosis (ICD10-I70.0). Electronically Signed   By: Iven Finn M.D.   On: 03/02/2021 23:57   [place] FINE NEEDLE ASP 1ST LESION  Result Date: 03/03/2021 INDICATION: Abscess, team requested aspiration only EXAM: Ultrasound-guided aspiration of left inguinal abscess MEDICATIONS: None. ANESTHESIA/SEDATION: Local analgesia FLUOROSCOPY TIME:  N/a COMPLICATIONS: None immediate. PROCEDURE: Informed written consent was obtained from the patient after a thorough discussion of the procedural risks, benefits and  alternatives. All questions were addressed. Maximal Sterile Barrier Technique was utilized including caps, mask, sterile gowns, sterile gloves, sterile drape, hand hygiene and skin antiseptic. A timeout was  performed prior to the initiation of the procedure. The patient was placed supine on the exam table. Limited ultrasound exam of the left inguinal area demonstrated heterogeneous air and fluid collection. Skin entry site was marked, overlying skin was prepped and draped in a standard sterile fashion. Local analgesia was obtained with 1% lidocaine. Under ultrasound guidance, a 19 gauge Yueh catheter was advanced into the identified air in fluid collection. Subsequently, 8 mL of purulent material was aspirated. Samples were sent to the lab for analysis. Postprocedure scanning demonstrated no significant fluid component with residual phlegmonous material. Hemostasis was achieved manual pressure, and a clean dressing was placed. The patient tolerated the procedure well without immediate complication. IMPRESSION: Successful ultrasound-guided aspiration of left inguinal abscess with 8 mL of purulent material collected and sent to the lab for analysis. Electronically Signed   By: Albin Felling M.D.   On: 03/03/2021 10:31    Medications   Scheduled Meds:  feeding supplement  1 Container Oral TID BM   insulin aspart  0-9 Units Subcutaneous Q4H   rOPINIRole  1 mg Oral BID   vancomycin  125 mg Oral QID   No recently discontinued medications to reconcile  LOS: 1 day   Richarda Osmond, DO [facility] 03/04/2021, 5:50 AM   Available by Epic secure chat 7AM-7PM. If 7PM-7AM, please contact night-coverage Refer to [email or web address] to contact the [facility] Attending or Consulting provider for this pt

## 2021-03-04 NOTE — Progress Notes (Signed)
Outpatient Surgical Follow Up  03/04/2021  Brittany [name] is an 61 y.o. female.   Chief Complaint  Patient presents with   Abdominal Pain    HPI: Doing ok, had large explosive BM. AVSS K corrected, nml WBC. Taking p.o.  No nausea no vomiting. cultures growing gram-negative rods from the aspiration  Past Medical History:  Diagnosis Date   Cancer ([place])    Collagen vascular disease ([place])    Diabetes mellitus without complication ([place])    Hypertension    Lupus ([place])     Past Surgical History:  Procedure Laterality Date   PANCREATECTOMY     spleenectomy      Family History  Problem Relation Age of Onset   Anxiety disorder Sister    Breast cancer Maternal Aunt     Social History:  reports that she has been smoking. She has never used smokeless tobacco. She reports current alcohol use. She reports that she does not currently use drugs.  Allergies:  Allergies  Allergen Reactions   Cephalosporins Itching   Hydromorphone Itching   Keflin [Cephalothin] Itching   Lactose Intolerance (Gi) Diarrhea    Medications reviewed.    ROS Full ROS performed and is otherwise negative other than what is stated in HPI   BP (!) 171/102 (BP Location: Right Arm)    Pulse 73    Temp 98.4 F (36.9 C) (Oral)    Resp 15    Ht 5' (1.524 m)    Wt 44 kg    SpO2 100%    BMI 18.94 kg/m   Physical Exam NAD, Abd: soft , nt no peritonitis Ext: well perfused    Results for orders placed or performed during the hospital encounter of 03/02/21 (from the past 48 hour(s))  Lipase, blood     Status: Abnormal   Collection Time: 03/02/21  2:48 PM  Result Value Ref Range   Lipase 254 (H) 11 - 51 U/L    Comment: Performed at [facility], [address] [place], [place] [address]  Comprehensive metabolic panel     Status: Abnormal   Collection Time: 03/02/21  2:48 PM  Result Value Ref Range   Sodium 134 (L) 135 - 145 mmol/L   Potassium 3.3 (L) 3.5 - 5.1 mmol/L   Chloride 97 (L) 98  - 111 mmol/L   CO2 27 22 - 32 mmol/L   Glucose, Bld 242 (H) 70 - 99 mg/dL    Comment: Glucose reference range applies only to samples taken after fasting for at least 8 hours.   BUN 10 8 - 23 mg/dL   Creatinine, Ser 0.51 0.44 - 1.00 mg/dL   Calcium 8.0 (L) 8.9 - 10.3 mg/dL   Total Protein 6.8 6.5 - 8.1 g/dL   Albumin 2.1 (L) 3.5 - 5.0 g/dL   AST 15 15 - 41 U/L   ALT 15 0 - 44 U/L   Alkaline Phosphatase 99 38 - 126 U/L   Total Bilirubin 0.4 0.3 - 1.2 mg/dL   GFR, Estimated >60 >60 mL/min    Comment: (NOTE) Calculated using the CKD-EPI Creatinine Equation (2021)    Anion gap 10 5 - 15    Comment: Performed at [facility], [address] [place], [place] [address]  CBC     Status: Abnormal   Collection Time: 03/02/21  2:48 PM  Result Value Ref Range   WBC 8.7 4.0 - 10.5 K/uL   RBC 3.64 (L) 3.87 - 5.11 MIL/uL  Hemoglobin 11.0 (L) 12.0 - 15.0 g/dL   HCT 33.2 (L) 36.0 - 46.0 %   MCV 91.2 80.0 - 100.0 fL   MCH 30.2 26.0 - 34.0 pg   MCHC 33.1 30.0 - 36.0 g/dL   RDW 13.4 11.5 - 15.5 %   Platelets 552 (H) 150 - 400 K/uL   nRBC 0.0 0.0 - 0.2 %    Comment: Performed at [facility], [address], [place], [place] [address]  Resp Panel by RT-PCR (Flu A&B, Covid) Nasopharyngeal Swab     Status: None   Collection Time: 03/02/21 10:46 PM   Specimen: Nasopharyngeal Swab; Nasopharyngeal(NP) swabs in vial transport medium  Result Value Ref Range   SARS Coronavirus 2 by RT PCR NEGATIVE NEGATIVE    Comment: (NOTE) SARS-CoV-2 target nucleic acids are NOT DETECTED.  The SARS-CoV-2 RNA is generally detectable in upper respiratory specimens during the acute phase of infection. The lowest concentration of SARS-CoV-2 viral copies this assay can detect is 138 copies/mL. A negative result does not preclude SARS-Cov-2 infection and should not be used as the sole basis for treatment or other patient management decisions. A negative result may occur with  improper specimen  collection/handling, submission of specimen other than nasopharyngeal swab, presence of viral mutation(s) within the areas targeted by this assay, and inadequate number of viral copies(<138 copies/mL). A negative result must be combined with clinical observations, patient history, and epidemiological information. The expected result is Negative.  Fact Sheet for Patients:  EntrepreneurPulse.com.au  Fact Sheet for Healthcare Providers:  IncredibleEmployment.be  This test is no t yet approved or cleared by the [place] FDA and  has been authorized for detection and/or diagnosis of SARS-CoV-2 by FDA under an Emergency Use Authorization (EUA). This EUA will remain  in effect (meaning this test can be used) for the duration of the COVID-19 declaration under Section 564(b)(1) of the Act, 21 U.S.C.section 360bbb-3(b)(1), unless the authorization is terminated  or revoked sooner.       Influenza A by PCR NEGATIVE NEGATIVE   Influenza B by PCR NEGATIVE NEGATIVE    Comment: (NOTE) The Xpert Xpress SARS-CoV-2/FLU/RSV plus assay is intended as an aid in the diagnosis of influenza from Nasopharyngeal swab specimens and should not be used as a sole basis for treatment. Nasal washings and aspirates are unacceptable for Xpert Xpress SARS-CoV-2/FLU/RSV testing.  Fact Sheet for Patients: EntrepreneurPulse.com.au  Fact Sheet for Healthcare Providers: IncredibleEmployment.be  This test is not yet approved or cleared by the [place] FDA and has been authorized for detection and/or diagnosis of SARS-CoV-2 by FDA under an Emergency Use Authorization (EUA). This EUA will remain in effect (meaning this test can be used) for the duration of the COVID-19 declaration under Section 564(b)(1) of the Act, 21 U.S.C. section 360bbb-3(b)(1), unless the authorization is terminated or revoked.  Performed at [facility], [address] [place], [place] [address]   CBG monitoring, ED     Status: Abnormal   Collection Time: 03/03/21  1:55 AM  Result Value Ref Range   Glucose-Capillary 305 (H) 70 - 99 mg/dL    Comment: Glucose reference range applies only to samples taken after fasting for at least 8 hours.  Glucose, capillary     Status: Abnormal   Collection Time: 03/03/21  5:38 AM  Result Value Ref Range   Glucose-Capillary 134 (H) 70 - 99 mg/dL    Comment: Glucose reference range applies only to samples taken after fasting for at  least 8 hours.  Comprehensive metabolic panel     Status: Abnormal   Collection Time: 03/03/21  6:36 AM  Result Value Ref Range   Sodium 135 135 - 145 mmol/L   Potassium 2.9 (L) 3.5 - 5.1 mmol/L   Chloride 99 98 - 111 mmol/L   CO2 30 22 - 32 mmol/L   Glucose, Bld 95 70 - 99 mg/dL    Comment: Glucose reference range applies only to samples taken after fasting for at least 8 hours.   BUN 7 (L) 8 - 23 mg/dL   Creatinine, Ser 0.43 (L) 0.44 - 1.00 mg/dL   Calcium 7.9 (L) 8.9 - 10.3 mg/dL   Total Protein 6.7 6.5 - 8.1 g/dL   Albumin 2.1 (L) 3.5 - 5.0 g/dL   AST 11 (L) 15 - 41 U/L   ALT 13 0 - 44 U/L   Alkaline Phosphatase 95 38 - 126 U/L   Total Bilirubin 0.4 0.3 - 1.2 mg/dL   GFR, Estimated >60 >60 mL/min    Comment: (NOTE) Calculated using the CKD-EPI Creatinine Equation (2021)    Anion gap 6 5 - 15    Comment: Performed at [facility], [address] [place], [place] [address]  CBC     Status: Abnormal   Collection Time: 03/03/21  6:36 AM  Result Value Ref Range   WBC 9.9 4.0 - 10.5 K/uL   RBC 3.29 (L) 3.87 - 5.11 MIL/uL   Hemoglobin 9.7 (L) 12.0 - 15.0 g/dL   HCT 29.7 (L) 36.0 - 46.0 %   MCV 90.3 80.0 - 100.0 fL   MCH 29.5 26.0 - 34.0 pg   MCHC 32.7 30.0 - 36.0 g/dL   RDW 13.3 11.5 - 15.5 %   Platelets 529 (H) 150 - 400 K/uL   nRBC 0.0 0.0 - 0.2 %    Comment: Performed at [facility], [address] [place], [place] [address]   Glucose, capillary     Status: Abnormal   Collection Time: 03/03/21  8:30 AM  Result Value Ref Range   Glucose-Capillary 69 (L) 70 - 99 mg/dL    Comment: Glucose reference range applies only to samples taken after fasting for at least 8 hours.  Glucose, capillary     Status: Abnormal   Collection Time: 03/03/21  9:00 AM  Result Value Ref Range   Glucose-Capillary 136 (H) 70 - 99 mg/dL    Comment: Glucose reference range applies only to samples taken after fasting for at least 8 hours.  Aerobic/Anaerobic Culture w Gram Stain (surgical/deep wound)     Status: None (Preliminary result)   Collection Time: 03/03/21 11:00 AM   Specimen: Abscess  Result Value Ref Range   Specimen Description      ABSCESS Performed at [facility], [address] [place], [place] [address]    Special Requests      LEFT INGUINAL Performed at [facility], [place], [place] [address]    Gram Stain      ABUNDANT WBC PRESENT, PREDOMINANTLY PMN FEW GRAM VARIABLE ROD RARE GRAM POSITIVE COCCI RARE YEAST    Culture      ABUNDANT GRAM NEGATIVE RODS IDENTIFICATION AND SUSCEPTIBILITIES TO FOLLOW Performed at [facility], [address] [place], [place] [address]    Report Status PENDING   Glucose, capillary     Status: Abnormal   Collection Time: 03/03/21 11:54 AM  Result Value Ref Range   Glucose-Capillary 135 (  H) 70 - 99 mg/dL    Comment: Glucose reference range applies only to samples taken after fasting for at least 8 hours.  Glucose, capillary     Status: Abnormal   Collection Time: 03/03/21  3:28 PM  Result Value Ref Range   Glucose-Capillary 128 (H) 70 - 99 mg/dL    Comment: Glucose reference range applies only to samples taken after fasting for at least 8 hours.  Glucose, capillary     Status: Abnormal   Collection Time: 03/03/21  8:08 PM  Result Value Ref Range   Glucose-Capillary 169 (H) 70 - 99 mg/dL    Comment: Glucose reference range applies only  to samples taken after fasting for at least 8 hours.  Glucose, capillary     Status: Abnormal   Collection Time: 03/04/21 12:08 AM  Result Value Ref Range   Glucose-Capillary 206 (H) 70 - 99 mg/dL    Comment: Glucose reference range applies only to samples taken after fasting for at least 8 hours.  Glucose, capillary     Status: None   Collection Time: 03/04/21  4:22 AM  Result Value Ref Range   Glucose-Capillary 87 70 - 99 mg/dL    Comment: Glucose reference range applies only to samples taken after fasting for at least 8 hours.  Lipase, blood     Status: None   Collection Time: 03/04/21  5:16 AM  Result Value Ref Range   Lipase 40 11 - 51 U/L    Comment: Performed at [facility], [address] [place], [place] [address]  Comprehensive metabolic panel     Status: Abnormal   Collection Time: 03/04/21  5:16 AM  Result Value Ref Range   Sodium 139 135 - 145 mmol/L   Potassium 3.9 3.5 - 5.1 mmol/L   Chloride 103 98 - 111 mmol/L   CO2 27 22 - 32 mmol/L   Glucose, Bld 90 70 - 99 mg/dL    Comment: Glucose reference range applies only to samples taken after fasting for at least 8 hours.   BUN 5 (L) 8 - 23 mg/dL   Creatinine, Ser 0.55 0.44 - 1.00 mg/dL   Calcium 8.2 (L) 8.9 - 10.3 mg/dL   Total Protein 6.6 6.5 - 8.1 g/dL   Albumin 2.2 (L) 3.5 - 5.0 g/dL   AST 17 15 - 41 U/L   ALT 13 0 - 44 U/L   Alkaline Phosphatase 93 38 - 126 U/L   Total Bilirubin 0.4 0.3 - 1.2 mg/dL   GFR, Estimated >60 >60 mL/min    Comment: (NOTE) Calculated using the CKD-EPI Creatinine Equation (2021)    Anion gap 9 5 - 15    Comment: Performed at [facility], [address] [place], [place] [address]  CBC     Status: Abnormal   Collection Time: 03/04/21  5:16 AM  Result Value Ref Range   WBC 8.5 4.0 - 10.5 K/uL   RBC 3.33 (L) 3.87 - 5.11 MIL/uL   Hemoglobin 9.9 (L) 12.0 - 15.0 g/dL   HCT 30.7 (L) 36.0 - 46.0 %   MCV 92.2 80.0 - 100.0 fL   MCH 29.7 26.0 - 34.0 pg   MCHC 32.2  30.0 - 36.0 g/dL   RDW 13.7 11.5 - 15.5 %   Platelets 550 (H) 150 - 400 K/uL   nRBC 0.0 0.0 - 0.2 %    Comment: Performed at [facility], [address], [place], [place] [address]  Glucose, capillary  Status: None   Collection Time: 03/04/21  7:32 AM  Result Value Ref Range   Glucose-Capillary 93 70 - 99 mg/dL    Comment: Glucose reference range applies only to samples taken after fasting for at least 8 hours.  Glucose, capillary     Status: Abnormal   Collection Time: 03/04/21 11:45 AM  Result Value Ref Range   Glucose-Capillary 181 (H) 70 - 99 mg/dL    Comment: Glucose reference range applies only to samples taken after fasting for at least 8 hours.   CT ABDOMEN PELVIS W CONTRAST  Result Date: 03/02/2021 CLINICAL DATA:  Peritonitis or perforation suspected Abdominal pain, acute, nonlocalized EXAM: CT ABDOMEN AND PELVIS WITH CONTRAST TECHNIQUE: Multidetector CT imaging of the abdomen and pelvis was performed using the standard protocol following bolus administration of intravenous contrast. RADIATION DOSE REDUCTION: This exam was performed according to the departmental dose-optimization program which includes automated exposure control, adjustment of the mA and/or kV according to patient size and/or use of iterative reconstruction technique. CONTRAST:  1mL OMNIPAQUE IOHEXOL 300 MG/ML  SOLN COMPARISON:  CT abdomen pelvis 02/14/2021, MRI abdomen 02/14/2021 FINDINGS: Lower chest: Limited evaluation due to respiratory motion artifact. Subsegmental atelectasis. No gross acute abnormality. Hepatobiliary: No focal liver abnormality. Status post cholecystectomy. No biliary dilatation. Pancreas: Similar-appearing 4.5 x 3.4 cm multiloculated cystic mass within the head/uncinate process of the pancreas with associated main pancreatic duct dilatation distally. Distal pancreas resection. Spleen: Splenectomy. Adrenals/Urinary Tract: No adrenal nodule bilaterally. Bilateral kidneys enhance  symmetrically. Bilateral subcentimeter hypodensities too small to characterize. No hydronephrosis. No hydroureter. The urinary bladder is unremarkable. On delayed imaging, there is no urothelial wall thickening and there are no filling defects in the opacified portions of the bilateral collecting systems or ureters. Stomach/Bowel: Stomach is within normal limits. No evidence of small bowel wall thickening or dilatation. Colonic diverticulosis. Redemonstration of a masslike thickened 7.3 x 5.3 cm the distal descending/proximal sigmoid (2:56) in the setting of underlying diverticulosis with associated intramural abscess formation measuring 2.7 x 1.6 x 1.1 cm (2:60, 5:23). Irregular bowel thickening of the rectosigmoid colon distally (2:66). Slight interval increase in size of an adjacent gas and fluid collection that is peripherally enhancing measuring 3.7 x 2.6 x 6.8 cm (from 3 x 1.7 3.4cm). The abscess appears to be extraperitoneal and then extend to the abdominal musculature. Appendix appears normal. 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 The main portal and superior mesenteric veins are patent. No abdominal aorta or iliac aneurysm. Severe atherosclerotic plaque of the aorta and its branches. No abdominal, pelvic, or inguinal lymphadenopathy. Reproductive: Uterus and bilateral adnexa are unremarkable. Other: No intraperitoneal free fluid. No intraperitoneal free gas. Musculoskeletal: Subcutaneus soft tissue edema. No suspicious lytic or blastic osseous lesions. No acute displaced fracture. IMPRESSION: 1. Redemonstration of a masslike thickened 7.3 x 5.3 cm the distal descending/proximal sigmoid in the setting of underlying diverticulosis with associated persistent 2.7 x 1.6 x 1.1 cm intramural abscess formation consistent with complicated acute diverticulitis. Persistent irregular bowel thickening of the rectosigmoid colon distally. An underlying malignancy is not  excluded. Recommend surgical consultation. Recommend colonoscopy status post treatment and status post complete resolution of inflammatory changes to exclude an underlying lesion. 2. Interval increase in size of an adjacent 3.7 x 2.6 x 6.8 cm (from 3 x 1.7 x 3.4cm) extraperitoneal abscess formation that appears to extend into the abdominal musculature. 3. Please consider the use of both PO and IV contrast in short-term future CT  scans. 4. Similar-appearing 4.5 x 3.4 cm multiloculated cystic mass within the head/uncinate process of the pancreas with associated main pancreatic duct dilatation distally. 5.  Aortic Atherosclerosis (ICD10-I70.0). Electronically Signed   By: Iven Finn M.D.   On: 03/02/2021 23:57   [place] FINE NEEDLE ASP 1ST LESION  Result Date: 03/03/2021 INDICATION: Abscess, team requested aspiration only EXAM: Ultrasound-guided aspiration of left inguinal abscess MEDICATIONS: None. ANESTHESIA/SEDATION: Local analgesia FLUOROSCOPY TIME:  N/a COMPLICATIONS: None immediate. PROCEDURE: Informed written consent was obtained from the patient after a thorough discussion of the procedural risks, benefits and alternatives. All questions were addressed. Maximal Sterile Barrier Technique was utilized including caps, mask, sterile gowns, sterile gloves, sterile drape, hand hygiene and skin antiseptic. A timeout was performed prior to the initiation of the procedure. The patient was placed supine on the exam table. Limited ultrasound exam of the left inguinal area demonstrated heterogeneous air and fluid collection. Skin entry site was marked, overlying skin was prepped and draped in a standard sterile fashion. Local analgesia was obtained with 1% lidocaine. Under ultrasound guidance, a 19 gauge Yueh catheter was advanced into the identified air in fluid collection. Subsequently, 8 mL of purulent material was aspirated. Samples were sent to the lab for analysis. Postprocedure scanning demonstrated no  significant fluid component with residual phlegmonous material. Hemostasis was achieved manual pressure, and a clean dressing was placed. The patient tolerated the procedure well without immediate complication. IMPRESSION: Successful ultrasound-guided aspiration of left inguinal abscess with 8 mL of purulent material collected and sent to the lab for analysis. Electronically Signed   By: Albin Felling M.D.   On: 03/03/2021 10:31    Assessment/Plan: Recurrent diverticular abscess status post aspiration.  Continue antibiotic therapy.  Repeat CT scan to evaluate whether a drain is actually needed. No Need for emergent surgical intervention at this time.  We will continue to follow  Caroleen Hamman, MD [place] Surgeon

## 2021-03-05 ENCOUNTER — Inpatient Hospital Stay: Payer: Medicare Other

## 2021-03-05 DIAGNOSIS — L02416 Cutaneous abscess of left lower limb: Secondary | ICD-10-CM

## 2021-03-05 DIAGNOSIS — K5720 Diverticulitis of large intestine with perforation and abscess without bleeding: Secondary | ICD-10-CM | POA: Diagnosis not present

## 2021-03-05 DIAGNOSIS — A0472 Enterocolitis due to Clostridium difficile, not specified as recurrent: Secondary | ICD-10-CM | POA: Diagnosis not present

## 2021-03-05 LAB — GLUCOSE, CAPILLARY
Glucose-Capillary: 223 mg/dL — ABNORMAL HIGH (ref 70–99)
Glucose-Capillary: 225 mg/dL — ABNORMAL HIGH (ref 70–99)
Glucose-Capillary: 238 mg/dL — ABNORMAL HIGH (ref 70–99)
Glucose-Capillary: 242 mg/dL — ABNORMAL HIGH (ref 70–99)
Glucose-Capillary: 276 mg/dL — ABNORMAL HIGH (ref 70–99)
Glucose-Capillary: 276 mg/dL — ABNORMAL HIGH (ref 70–99)

## 2021-03-05 MED ORDER — LIDOCAINE-EPINEPHRINE 1 %-1:100000 IJ SOLN
20.0000 mL | Freq: Once | INTRAMUSCULAR | Status: AC
  Administered 2021-03-05: 17:00:00 20 mL
  Filled 2021-03-05: qty 20

## 2021-03-05 MED ORDER — IOHEXOL 300 MG/ML  SOLN
100.0000 mL | Freq: Once | INTRAMUSCULAR | Status: AC | PRN
  Administered 2021-03-05: 100 mL via INTRAVENOUS

## 2021-03-05 MED ORDER — MORPHINE SULFATE (PF) 4 MG/ML IV SOLN
4.0000 mg | INTRAVENOUS | Status: DC | PRN
  Administered 2021-03-05: 4 mg via INTRAVENOUS
  Filled 2021-03-05: qty 1

## 2021-03-05 NOTE — Progress Notes (Signed)
°   03/05/21 0505  Assess: MEWS Score  Temp (!) 101.8 F (38.8 C)  BP 136/80  Pulse Rate 100  Resp 18  Level of Consciousness Alert  SpO2 99 %  O2 Device Room Air  Assess: MEWS Score  MEWS Temp 2  MEWS Systolic 0  MEWS Pulse 0  MEWS RR 0  MEWS LOC 0  MEWS Score 2  MEWS Score Color Yellow  Assess: if the MEWS score is Yellow or Red  Were vital signs taken at a resting state? Yes  Focused Assessment Change from prior assessment (see assessment flowsheet)  Does the patient meet 2 or more of the SIRS criteria? Yes  Does the patient have a confirmed or suspected source of infection? Yes  Provider and Rapid Response Notified? Yes  MEWS guidelines implemented *See Row Information* Yes  Treat  MEWS Interventions Administered prn meds/treatments;Escalated (See documentation below)  Take Vital Signs  Increase Vital Sign Frequency  Yellow: Q 2hr X 2 then Q 4hr X 2, if remains yellow, continue Q 4hrs  Escalate  MEWS: Escalate Yellow: discuss with charge nurse/RN and consider discussing with provider and RRT  Notify: Charge Nurse/RN  Name of Charge Nurse/RN Notified Phyllis RN  Date Charge Nurse/RN Notified 03/05/21  Time Charge Nurse/RN Notified [id number]  Notify: Provider  Provider Name/Title Sharion Settler NP  Date Provider Notified 03/05/21  Time Provider Notified [phone]  Notification Type Page  Notification Reason Change in status  Provider response No new orders  Date of Provider Response 03/05/21  Time of Provider Response [phone]  Document  Progress note created (see row info) Yes  Assess: SIRS CRITERIA  SIRS Temperature  1  SIRS Pulse 1  SIRS Respirations  0  SIRS WBC 0  SIRS Score Sum  2

## 2021-03-05 NOTE — Progress Notes (Signed)
Diverticulitis  Subjective:  Worsening thigh / Inguinal pain. Febrile Repeat CT showed persistent diverticulitis w worsening retroperitoneal abscess tracking to the left thigh and inguinal region,  Vital signs in last 24 hours: Temp:  [98 F (36.7 C)-101.8 F (38.8 C)] 99.5 F (37.5 C) (01/22 2016) Pulse Rate:  [81-100] 87 (01/22 2016) Resp:  [16-19] 18 (01/22 2016) BP: (104-141)/(70-94) 141/94 (01/22 2016) SpO2:  [89 %-99 %] 93 % (01/22 2016) Last BM Date: 03/05/21  Intake/Output from previous day: 01/21 0701 - 01/22 0700 In: 580 [P.O.:480; IV Piggyback:100] Out: 300 [Urine:300]  Exam: NAD alert Chest: CTA, NSR Abd: Soft, nt Ext: left thigh and inguinal region w fluctuance and tenderness to palpation. Neuro Awake and alert, GCS 15   Lab Results:  CBC Recent Labs    03/03/21 0636 03/04/21 0516  WBC 9.9 8.5  HGB 9.7* 9.9*  HCT 29.7* 30.7*  PLT 529* 550*   CMP     Component Value Date/Time   NA 139 03/04/2021 0516   NA 139 08/17/2011 1815   K 3.9 03/04/2021 0516   K 3.6 08/17/2011 1815   CL 103 03/04/2021 0516   CL 105 08/17/2011 1815   CO2 27 03/04/2021 0516   CO2 27 08/17/2011 1815   GLUCOSE 90 03/04/2021 0516   GLUCOSE 61 (L) 08/17/2011 1815   BUN 5 (L) 03/04/2021 0516   BUN 18 08/17/2011 1815   CREATININE 0.55 03/04/2021 0516   CREATININE 1.17 08/17/2011 1815   CALCIUM 8.2 (L) 03/04/2021 0516   CALCIUM 9.7 08/17/2011 1815   PROT 6.6 03/04/2021 0516   PROT 9.8 (H) 04/04/2011 1527   ALBUMIN 2.2 (L) 03/04/2021 0516   ALBUMIN 4.5 04/04/2011 1527   AST 17 03/04/2021 0516   AST 35 04/04/2011 1527   ALT 13 03/04/2021 0516   ALT 33 04/04/2011 1527   ALKPHOS 93 03/04/2021 0516   ALKPHOS 97 04/04/2011 1527   BILITOT 0.4 03/04/2021 0516   BILITOT 0.6 04/04/2011 1527   GFRNONAA >60 03/04/2021 0516   GFRNONAA 54 (L) 08/17/2011 1815   GFRAA >60 11/02/2019 1752   GFRAA >60 08/17/2011 1815   PT/INR No results for input(s): LABPROT, INR in the last 72  hours.  Studies/Results: CT ABDOMEN PELVIS W CONTRAST  Result Date: 03/05/2021 CLINICAL DATA:  Follow-up diverticulitis and diverticular abscess. EXAM: CT ABDOMEN AND PELVIS WITH CONTRAST TECHNIQUE: Multidetector CT imaging of the abdomen and pelvis was performed using the standard protocol following bolus administration of intravenous contrast. RADIATION DOSE REDUCTION: This exam was performed according to the departmental dose-optimization program which includes automated exposure control, adjustment of the mA and/or kV according to patient size and/or use of iterative reconstruction technique. CONTRAST:  156mL OMNIPAQUE IOHEXOL 300 MG/ML  SOLN COMPARISON:  03/02/2021 FINDINGS: Lower chest: Peripheral atelectasis and airspace disease within the posterior right lung base. No pleural effusion identified. Hepatobiliary: No focal liver abnormality is seen. No gallstones, gallbladder wall thickening, or biliary dilatation. Pancreas: Status post distal pancreatectomy. Unchanged appearance of multiloculated cystic mass involving the head of pancreas/uncinate process. On today's study this has a maximum dimension on the axial images a 4.5 cm, unchanged from previous exam. Spleen: Status post splenectomy. Adrenals/Urinary Tract: Normal adrenal glands. No signs of nephrolithiasis, hydronephrosis or mass. Bladder appears normal for degree of distension. Stomach/Bowel: Stomach appears within normal limits. Small bowel loops are unremarkable. Abnormal appearance of the proximal sigmoid colon is again noted with diffuse wall thickening and inflammation. The inflamed segment of sigmoid colon measures 9.3  x 6.3 cm, image 25/6. This is compared with the same previously. As mentioned previously there are signs of intramural abscess formation. Previously measured intramural abscess measures 2.5 by 1.5 cm, image 23/6. Unchanged from previous exam. There is evidence of fistulous communication of this abscess with a peripherally  enhancing gas and fluid collection within the superficial soft tissues overlying the left iliac fossa. This fluid collection measures 4.5 by 2.3 cm, image 15/6. Previously 3.9 x 2.2 cm. This gives rise to a fistulous tract which communicates with a abscess measuring 5.3 x 3.8 cm, image 22/6. Previously the left inguinal abscess measured 6.8 x 3.6 cm. There is no signs of bowel obstruction. Vascular/Lymphatic: Aortic atherosclerosis without aneurysm. No abdominopelvic adenopathy identified Reproductive: Uterus and bilateral adnexa are unremarkable. Other: No significant free fluid or fluid collections within the abdomen or pelvis. Musculoskeletal: No acute or significant osseous findings. IMPRESSION: 1. No significant change in appearance of the sigmoid colon with diffuse wall thickening and inflammation compatible with diverticulitis. 2. Again seen is intramural abscess formation within the affected loop of proximal sigmoid colon. As noted previously there is fistulous communication with a superficial fluid collection overlying the left iliac fossa. The fluid collection overlying the left iliac fossa gives rise to a larger fluid collection extending into the left inguinal region as noted previously. These fluid collections do not appear significantly improved when compared with study from 03/02/2021. No new fluid collections identified. 3. Stable appearance of multiloculated cystic mass involving the head of pancreas/uncinate process. 4. Peripheral atelectasis and airspace disease within the posterior right lung base. 5. Aortic Atherosclerosis (ICD10-I70.0). Electronically Signed   By: Kerby Moors M.D.   On: 03/05/2021 12:52    Assessment/Plan: Diverticulitis w  intramural and retroperitoneal abscess tracking to the Deep Left groin. D/W the pt in detail. Given that aspiration has failed next step is to do open I/D procedure d/w the pt in detail . Risks , benefits and possible complications. We will do it at  bedside if possible. Diverticulitis now draining to the groin. Hopefully we can manage the fistulous tract w A/Bs and local wound care. If she continuous to fail may require a Hartmann's for definitive control. Please note that I spent 50 minutes in this encounter including personally reviewing images, counseling the pt, placing order and performing appropriate documentation.   Procedure Note  Incision and drainage of Deep Left upper thigh and inguinal abscess  Anesthesia: lidocaine 1% w epi  Complications : none  Findings: 10cc pus drained and sent to culture  Consent obtained. Pt placed in the supine position and lidocaine infiltrated. Using a 15 blade knife incision created, the abscess was deep and subfascial, fascia incised and pus drained. Wound irrigated and wick placed. Hemostasis obtained by holding pressure, No complications   Caroleen Hamman, MD FACS

## 2021-03-05 NOTE — Consult Note (Signed)
[place] Nurse Consult Note: [place] nurse is consulted for wound care guidance.  Surgery saw this patient yesterday and indicated that they were to follow.  Consult discontinued as it was nurse-generated and because wound is being managed by a Psychologist, sport and exercise.  [place] nursing team will not follow, but will remain available to this patient, the nursing and medical teams.  Please re-consult if needed. Thanks, Maudie Flakes, MSN, RN, [place], Arther Abbott  Pager# [phone]

## 2021-03-05 NOTE — Progress Notes (Signed)
PROGRESS NOTE  Brittany [name]    DOB: Feb 06, 1961, 61 y.o.  QBH:[id number]  PCP: [facility]   Code Status: Full Code   DOA: 03/02/2021   LOS: 2  Brief Narrative of Current Hospitalization  Brittany [name] is a 61 y.o. 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  C. difficile PCR positive on 02/14/2021 treated with vancomycin. They presented from home to the ED on 03/02/2021 with LLQ pain associated with N/V and diarrhea x several days. In the ED, it was found that they had recurrent colonic diverticular abscess as seen on abdominal CT. They were treated with IV zosyn, analgesia, antiemetics and supportive care. She was restarted on oral vancomycin for continued C.diff.  1/20 underwent percutaneous drainage of superficial abscess by IR.  Patient was admitted to medicine service for further workup and management of recurrent colonic diverticular abscess as outlined in detail below.  03/05/21 -stable  Assessment & Plan  Principal Problem:   Colonic diverticular abscess Active Problems:   Discoid lupus erythematosus   Essential hypertension   IPMN (intraductal papillary mucinous neoplasm)   Restless leg syndrome   Depression   Pancreatic cyst   Elevated lipase   History of Clostridium difficile colitis   Hyperglycemia due to type 2 diabetes mellitus ([place])   Abscess  Colonic diverticular abscess, recurrent- s/p abscess aspiration 1/20. Cultures showing e coli with sensitivities to zosyn. Patient has cephalosporin allergy. Febrile overnight with Tmax 101.8. received tylenol.  -IV Zosyn, pain control, IV antiemetics - repeat abdominal CT 1/22    Suspect persistent C. difficile diarrhea- continues to have frequent loose bms. Clostridium difficile infection 02/14/2021 ?recurrent/persistent - We will resume oral vancomycin for extended course -Consider ID consult for additional recommendations     Hyperglycemia due to type 2 diabetes mellitus ([place])- borderline hypoglycemia.  - Holding insulin until better PO     Discoid lupus erythematosus- no home medication     Essential hypertension -Continue benazepril     IPMN (intraductal papillary mucinous neoplasm) s/p partial pancreatectomy and splenectomy - Followed by oncology with serial CTs - Continue Pancrease     Restless leg syndrome - Continue ropinirole   Elevated lipase Pancreatic cyst - Stable cyst on CT of pancreas  DVT prophylaxis: SCDs Start: 03/03/21 0145   Diet:  Diet Orders (From admission, onward)     Start     Ordered   03/03/21 1121  Diet clear liquid Room service appropriate? Yes; Fluid consistency: Thin  Diet effective now       Question Answer Comment  Room service appropriate? Yes   Fluid consistency: Thin      03/03/21 1120            Subjective 03/05/21    Pt reports frustration today with continuous loose Bms. She would like to have some scrambled eggs. Otherwise she is doing ok.  Disposition Plan & Communication  Patient status: Inpatient  Admitted From: Home Disposition: Home Anticipated discharge date: TBD  Family Communication: none  Consults, Procedures, Significant Events  Consultants:  IR General surgery  Procedures/significant events:  Percutaneous drainage of abscess 1/20  Antimicrobials:  Anti-infectives (From admission, onward)    Start     Dose/Rate  Route Frequency Ordered Stop   03/04/21 1000  piperacillin-tazobactam (ZOSYN) IVPB 3.375 g        3.375 g 12.5 mL/hr over 240 Minutes Intravenous Every 8 hours 03/04/21 0940     03/03/21 1000  vancomycin (VANCOCIN) capsule 125 mg        125 mg Oral 4 times daily  03/03/21 0440 03/13/21 0959   03/03/21 0045  piperacillin-tazobactam (ZOSYN) IVPB 3.375 g        3.375 g 100 mL/hr over 30 Minutes Intravenous  Once 03/03/21 0031 03/03/21 0115       Objective   Vitals:   03/04/21 1957 03/04/21 2353 03/05/21 0505 03/05/21 0609  BP: (!) 181/99 (!) 141/89 136/80   Pulse: 71 81 100   Resp: 16 16 18    Temp: 98.9 F (37.2 C) 99.5 F (37.5 C) (!) 101.8 F (38.8 C) (!) 101.4 F (38.6 C)  TempSrc: Oral Oral Oral Oral  SpO2: 98% 93% 99%   Weight:      Height:        Intake/Output Summary (Last 24 hours) at 03/05/2021 0718 Last data filed at 03/05/2021 4128 Gross per 24 hour  Intake 580 ml  Output 300 ml  Net 280 ml    Filed Weights   03/03/21 0217  Weight: 44 kg    Patient BMI: Body mass index is 18.94 kg/m.   Physical Exam:  General: awake, alert, NAD HEENT: atraumatic, clear conjunctiva, anicteric sclera, MMM, hearing grossly normal Respiratory: normal respiratory effort. Cardiovascular: normal S1/S2, RRR, no JVD, murmurs, quick capillary refill  Gastrointestinal: soft, NT, ND Nervous: A&O x3. no gross focal neurologic deficits, normal speech Extremities: moves all equally, no edema, normal tone Skin: dry, intact, normal temperature, normal color. No rashes, lesions or ulcers on exposed skin Psychiatry: tearful  Labs   I have personally reviewed following labs and imaging studies Admission on 03/02/2021  Component Date Value Ref Range Status   Lipase 03/02/2021 254 (H)  11 - 51 U/L Final   Sodium 03/02/2021 134 (L)  135 - 145 mmol/L Final   Potassium 03/02/2021 3.3 (L)  3.5 - 5.1 mmol/L Final   Chloride 03/02/2021 97 (L)  98 - 111 mmol/L Final   CO2 03/02/2021 27  22 - 32 mmol/L Final   Glucose, Bld 03/02/2021 242 (H)  70 - 99 mg/dL Final   BUN 03/02/2021 10  8 - 23 mg/dL Final   Creatinine, Ser 03/02/2021 0.51  0.44 - 1.00 mg/dL Final   Calcium 03/02/2021 8.0 (L)  8.9 - 10.3 mg/dL Final   Total Protein 03/02/2021 6.8  6.5 - 8.1  g/dL Final   Albumin 03/02/2021 2.1 (L)  3.5 - 5.0 g/dL Final   AST 03/02/2021 15  15 - 41 U/L Final   ALT 03/02/2021 15  0 - 44 U/L Final   Alkaline Phosphatase 03/02/2021 99  38 - 126 U/L Final   Total Bilirubin 03/02/2021 0.4  0.3 - 1.2 mg/dL Final   GFR, Estimated 03/02/2021 >60  >60 mL/min Final   Anion gap 03/02/2021 10  5 - 15 Final   WBC 03/02/2021 8.7  4.0 - 10.5 K/uL Final   RBC 03/02/2021 3.64 (L)  3.87 - 5.11 MIL/uL Final   Hemoglobin 03/02/2021 11.0 (L)  12.0 - 15.0 g/dL Final   HCT 03/02/2021 33.2 (L)  36.0 - 46.0 % Final   MCV 03/02/2021 91.2  80.0 - 100.0 fL Final   MCH 03/02/2021 30.2  26.0 - 34.0 pg Final  MCHC 03/02/2021 33.1  30.0 - 36.0 g/dL Final   RDW 03/02/2021 13.4  11.5 - 15.5 % Final   Platelets 03/02/2021 552 (H)  150 - 400 K/uL Final   nRBC 03/02/2021 0.0  0.0 - 0.2 % Final   SARS Coronavirus 2 by RT PCR 03/02/2021 NEGATIVE  NEGATIVE Final   Influenza A by PCR 03/02/2021 NEGATIVE  NEGATIVE Final   Influenza B by PCR 03/02/2021 NEGATIVE  NEGATIVE Final   Sodium 03/03/2021 135  135 - 145 mmol/L Final   Potassium 03/03/2021 2.9 (L)  3.5 - 5.1 mmol/L Final   Chloride 03/03/2021 99  98 - 111 mmol/L Final   CO2 03/03/2021 30  22 - 32 mmol/L Final   Glucose, Bld 03/03/2021 95  70 - 99 mg/dL Final   BUN 03/03/2021 7 (L)  8 - 23 mg/dL Final   Creatinine, Ser 03/03/2021 0.43 (L)  0.44 - 1.00 mg/dL Final   Calcium 03/03/2021 7.9 (L)  8.9 - 10.3 mg/dL Final   Total Protein 03/03/2021 6.7  6.5 - 8.1 g/dL Final   Albumin 03/03/2021 2.1 (L)  3.5 - 5.0 g/dL Final   AST 03/03/2021 11 (L)  15 - 41 U/L Final   ALT 03/03/2021 13  0 - 44 U/L Final   Alkaline Phosphatase 03/03/2021 95  38 - 126 U/L Final   Total Bilirubin 03/03/2021 0.4  0.3 - 1.2 mg/dL Final   GFR, Estimated 03/03/2021 >60  >60 mL/min Final   Anion gap 03/03/2021 6  5 - 15 Final   WBC 03/03/2021 9.9  4.0 - 10.5 K/uL Final   RBC 03/03/2021 3.29 (L)  3.87 - 5.11 MIL/uL Final   Hemoglobin 03/03/2021 9.7  (L)  12.0 - 15.0 g/dL Final   HCT 03/03/2021 29.7 (L)  36.0 - 46.0 % Final   MCV 03/03/2021 90.3  80.0 - 100.0 fL Final   MCH 03/03/2021 29.5  26.0 - 34.0 pg Final   MCHC 03/03/2021 32.7  30.0 - 36.0 g/dL Final   RDW 03/03/2021 13.3  11.5 - 15.5 % Final   Platelets 03/03/2021 529 (H)  150 - 400 K/uL Final   nRBC 03/03/2021 0.0  0.0 - 0.2 % Final   Glucose-Capillary 03/03/2021 305 (H)  70 - 99 mg/dL Final   Glucose-Capillary 03/03/2021 134 (H)  70 - 99 mg/dL Final   Glucose-Capillary 03/03/2021 69 (L)  70 - 99 mg/dL Final   Glucose-Capillary 03/03/2021 136 (H)  70 - 99 mg/dL Final   Specimen Description 03/03/2021    Final                   Value:ABSCESS Performed at [facility], [address] [place], [place] [address]    Special Requests 03/03/2021    Final                   Value:LEFT INGUINAL Performed at [facility], [address] [place], [place] [address]    Gram Stain 03/03/2021    Final                   Value:ABUNDANT WBC PRESENT, PREDOMINANTLY PMN FEW GRAM VARIABLE ROD RARE GRAM POSITIVE COCCI RARE YEAST    Culture 03/03/2021    Final                   Value:ABUNDANT GRAM NEGATIVE RODS IDENTIFICATION AND SUSCEPTIBILITIES TO FOLLOW Performed at [facility], [place] [address], [place], [place]  [address]    Report Status 03/03/2021 PENDING   Incomplete   Glucose-Capillary 03/03/2021 135 (H)  70 - 99 mg/dL Final   Glucose-Capillary 03/03/2021 128 (H)  70 - 99 mg/dL Final   Lipase 03/04/2021 40  11 - 51 U/L Final   Sodium 03/04/2021 139  135 - 145 mmol/L Final   Potassium 03/04/2021 3.9  3.5 - 5.1 mmol/L Final   Chloride 03/04/2021 103  98 - 111 mmol/L Final   CO2 03/04/2021 27  22 - 32 mmol/L Final   Glucose, Bld 03/04/2021 90  70 - 99 mg/dL Final   BUN 03/04/2021 5 (L)  8 - 23 mg/dL Final   Creatinine, Ser 03/04/2021 0.55  0.44 - 1.00 mg/dL Final   Calcium 03/04/2021 8.2 (L)  8.9 - 10.3 mg/dL Final   Total Protein 03/04/2021 6.6  6.5 -  8.1 g/dL Final   Albumin 03/04/2021 2.2 (L)  3.5 - 5.0 g/dL Final   AST 03/04/2021 17  15 - 41 U/L Final   ALT 03/04/2021 13  0 - 44 U/L Final   Alkaline Phosphatase 03/04/2021 93  38 - 126 U/L Final   Total Bilirubin 03/04/2021 0.4  0.3 - 1.2 mg/dL Final   GFR, Estimated 03/04/2021 >60  >60 mL/min Final   Anion gap 03/04/2021 9  5 - 15 Final   WBC 03/04/2021 8.5  4.0 - 10.5 K/uL Final   RBC 03/04/2021 3.33 (L)  3.87 - 5.11 MIL/uL Final   Hemoglobin 03/04/2021 9.9 (L)  12.0 - 15.0 g/dL Final   HCT 03/04/2021 30.7 (L)  36.0 - 46.0 % Final   MCV 03/04/2021 92.2  80.0 - 100.0 fL Final   MCH 03/04/2021 29.7  26.0 - 34.0 pg Final   MCHC 03/04/2021 32.2  30.0 - 36.0 g/dL Final   RDW 03/04/2021 13.7  11.5 - 15.5 % Final   Platelets 03/04/2021 550 (H)  150 - 400 K/uL Final   nRBC 03/04/2021 0.0  0.0 - 0.2 % Final   Glucose-Capillary 03/03/2021 169 (H)  70 - 99 mg/dL Final   Glucose-Capillary 03/04/2021 206 (H)  70 - 99 mg/dL Final   Glucose-Capillary 03/04/2021 87  70 - 99 mg/dL Final   Glucose-Capillary 03/04/2021 93  70 - 99 mg/dL Final   Glucose-Capillary 03/04/2021 181 (H)  70 - 99 mg/dL Final   Glucose-Capillary 03/04/2021 182 (H)  70 - 99 mg/dL Final   Glucose-Capillary 03/04/2021 248 (H)  70 - 99 mg/dL Final   Glucose-Capillary 03/04/2021 216 (H)  70 - 99 mg/dL Final   Glucose-Capillary 03/05/2021 238 (H)  70 - 99 mg/dL Final    Imaging Studies  [place] FINE NEEDLE ASP 1ST LESION  Result Date: 03/03/2021 INDICATION: Abscess, team requested aspiration only EXAM: Ultrasound-guided aspiration of left inguinal abscess MEDICATIONS: None. ANESTHESIA/SEDATION: Local analgesia FLUOROSCOPY TIME:  N/a COMPLICATIONS: None immediate. PROCEDURE: Informed written consent was obtained from the patient after a thorough discussion of the procedural risks, benefits and alternatives. All questions were addressed. Maximal Sterile Barrier Technique was utilized including caps, mask, sterile gowns, sterile  gloves, sterile drape, hand hygiene and skin antiseptic. A timeout was performed prior to the initiation of the procedure. The patient was placed supine on the exam table. Limited ultrasound exam of the left inguinal area demonstrated heterogeneous air and fluid collection. Skin entry site was marked, overlying skin was prepped and draped in a standard sterile fashion. Local analgesia was obtained with 1% lidocaine. Under ultrasound guidance, a 19 gauge [place]  catheter was advanced into the identified air in fluid collection. Subsequently, 8 mL of purulent material was aspirated. Samples were sent to the lab for analysis. Postprocedure scanning demonstrated no significant fluid component with residual phlegmonous material. Hemostasis was achieved manual pressure, and a clean dressing was placed. The patient tolerated the procedure well without immediate complication. IMPRESSION: Successful ultrasound-guided aspiration of left inguinal abscess with 8 mL of purulent material collected and sent to the lab for analysis. Electronically Signed   By: Albin Felling M.D.   On: 03/03/2021 10:31    Medications   Scheduled Meds:  benazepril  10 mg Oral Daily   feeding supplement  1 Container Oral TID BM   gabapentin  600 mg Oral QHS   lipase/protease/amylase  12,000 Units Oral TID   mirtazapine  15 mg Oral QHS   rOPINIRole  1 mg Oral BID   vancomycin  125 mg Oral QID   No recently discontinued medications to reconcile  LOS: 2 days   Richarda Osmond, DO [facility] 03/05/2021, 7:18 AM   Available by Epic secure chat 7AM-7PM. If 7PM-7AM, please contact night-coverage Refer to [email or web address] to contact the [facility] Attending or Consulting provider for this pt

## 2021-03-06 ENCOUNTER — Other Ambulatory Visit (HOSPITAL_COMMUNITY): Payer: Self-pay

## 2021-03-06 DIAGNOSIS — K5720 Diverticulitis of large intestine with perforation and abscess without bleeding: Secondary | ICD-10-CM | POA: Diagnosis not present

## 2021-03-06 DIAGNOSIS — A0472 Enterocolitis due to Clostridium difficile, not specified as recurrent: Secondary | ICD-10-CM | POA: Diagnosis not present

## 2021-03-06 LAB — AEROBIC/ANAEROBIC CULTURE W GRAM STAIN (SURGICAL/DEEP WOUND)

## 2021-03-06 LAB — CBC
HCT: 28.8 % — ABNORMAL LOW (ref 36.0–46.0)
Hemoglobin: 9.5 g/dL — ABNORMAL LOW (ref 12.0–15.0)
MCH: 29.8 pg (ref 26.0–34.0)
MCHC: 33 g/dL (ref 30.0–36.0)
MCV: 90.3 fL (ref 80.0–100.0)
Platelets: 591 10*3/uL — ABNORMAL HIGH (ref 150–400)
RBC: 3.19 MIL/uL — ABNORMAL LOW (ref 3.87–5.11)
RDW: 13.6 % (ref 11.5–15.5)
WBC: 10.9 10*3/uL — ABNORMAL HIGH (ref 4.0–10.5)
nRBC: 0 % (ref 0.0–0.2)

## 2021-03-06 LAB — GLUCOSE, CAPILLARY
Glucose-Capillary: 247 mg/dL — ABNORMAL HIGH (ref 70–99)
Glucose-Capillary: 222 mg/dL — ABNORMAL HIGH (ref 70–99)
Glucose-Capillary: 214 mg/dL — ABNORMAL HIGH (ref 70–99)
Glucose-Capillary: 205 mg/dL — ABNORMAL HIGH (ref 70–99)
Glucose-Capillary: 230 mg/dL — ABNORMAL HIGH (ref 70–99)

## 2021-03-06 MED ORDER — INSULIN ASPART 100 UNIT/ML IJ SOLN
0.0000 [IU] | Freq: Three times a day (TID) | INTRAMUSCULAR | Status: DC
  Administered 2021-03-06 (×2): 2 [IU] via SUBCUTANEOUS
  Administered 2021-03-07: 08:00:00 1 [IU] via SUBCUTANEOUS
  Administered 2021-03-07: 16:00:00 2 [IU] via SUBCUTANEOUS
  Administered 2021-03-07 – 2021-03-08 (×3): 4 [IU] via SUBCUTANEOUS
  Administered 2021-03-09 – 2021-03-10 (×2): 2 [IU] via SUBCUTANEOUS
  Filled 2021-03-06 (×9): qty 1

## 2021-03-06 MED ORDER — PANCRELIPASE (LIP-PROT-AMYL) 12000-38000 UNITS PO CPEP
24000.0000 [IU] | ORAL_CAPSULE | Freq: Three times a day (TID) | ORAL | Status: DC
  Administered 2021-03-06 – 2021-03-14 (×22): 24000 [IU] via ORAL
  Filled 2021-03-06 (×27): qty 2

## 2021-03-06 NOTE — Progress Notes (Signed)
PROGRESS NOTE  COPPER KIRTLEY    DOB: 11-30-1960, 61 y.o.  [id number]:[id number]  PCP: Clinic, [facility]   Code Status: Full Code   DOA: 03/02/2021   LOS: 3  Brief Narrative of Current Hospitalization  Brittany [name] is a 61 y.o. 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  C. difficile PCR positive on 02/14/2021 treated with vancomycin. They presented from home to the ED on 03/02/2021 with LLQ pain associated with N/V and diarrhea x several days. In the ED, it was found that they had recurrent colonic diverticular abscess as seen on abdominal CT. They were treated with IV zosyn, analgesia, antiemetics and supportive care. She was restarted on oral vancomycin for continued C.diff. Patient was admitted to medicine service for further workup and management of recurrent colonic diverticular abscess as outlined in detail below.  1/20 underwent percutaneous drainage of superficial abscess by IR. 1/22 underwent I&D of superficial abscess as repeat CT did not show significant improvement.   03/06/21 -stable  Assessment & Plan  Principal Problem:   Colonic diverticular abscess Active Problems:   Discoid lupus erythematosus   Essential hypertension   IPMN (intraductal papillary mucinous neoplasm)   Restless leg syndrome   Depression   Pancreatic cyst   Elevated lipase   History of Clostridium difficile colitis   Hyperglycemia due to type 2 diabetes mellitus ([place])   Abscess  Colonic diverticular abscess, recurrent- s/p superficial abscess aspiration 1/20. I&D 1/22. Cultures showing e coli with sensitivities to zosyn. Patient has cephalosporin allergy. Afebrile  about 24 hours. Incision from I&D without erythema or drainage. Iodoform packing in place. repeat abdominal CT 1/22 not showing significant improvement. - general surgery following, appreciate recs  - tentative plan fur surgical intervention tomorrow in setting of CT abdomen findings not improved significantly.  -IV Zosyn, pain control, IV antiemetics  IPMN (intraductal papillary mucinous neoplasm) s/p partial pancreatectomy and splenectomy- contributing to chronic diarrhea - Followed by oncology with serial CTs - Continue Pancrease and increased dose   Suspect persistent C. difficile diarrhea- continues to have frequent loose bms. Clostridium difficile infection 02/14/2021. ?recurrent/persistent - We will resume oral vancomycin for extended course -Consider ID consult for additional recommendations   Hyperglycemia due to type 2 diabetes mellitus ([place])- borderline hypoglycemia while she was NPO but is now having elevated BS with diet added.  - sSSI   Discoid lupus erythematosus- no home medication   Essential hypertension- chronic, stable -Continue benazepril    Restless leg syndrome- chronic, stable on home medications - Continue ropinirole   Elevated lipase Pancreatic cyst - Stable cyst on CT of pancreas  DVT prophylaxis: SCDs Start: 03/03/21 0145   Diet:  Diet Orders (From admission, onward)     Start     Ordered   03/05/21 1550  Diet NPO time specified  Diet effective now        03/05/21 1549            Subjective 03/06/21    Pt reports abdominal pain at site of I&D. Denies N/V. She states having a regular diet yesterday was glorious!  Disposition Plan & Communication  Patient status: Inpatient  Admitted From: Home Disposition: Home Anticipated discharge date: TBD  Family Communication: none  Consults, Procedures, Significant Events  Consultants:  IR General surgery  Procedures/significant events:  Percutaneous drainage of abscess 1/20 I&D of  superficial abscess 1/22  Antimicrobials:  Anti-infectives (From admission, onward)    Start     Dose/Rate Route Frequency Ordered Stop   03/04/21 1000  piperacillin-tazobactam (ZOSYN) IVPB 3.375 g        3.375 g 12.5 mL/hr over 240 Minutes Intravenous Every 8 hours 03/04/21 0940     03/03/21 1000  vancomycin (VANCOCIN) capsule 125 mg        125 mg Oral 4 times daily 03/03/21 0440 03/13/21 0959   03/03/21 0045  piperacillin-tazobactam (ZOSYN) IVPB 3.375 g        3.375 g 100 mL/hr over 30 Minutes Intravenous  Once 03/03/21 0031 03/03/21 0115       Objective   Vitals:   03/05/21 1644 03/05/21 2016 03/05/21 2348 03/06/21 0440  BP: 120/70 (!) 141/94 140/85 (!) 155/90  Pulse: 92 87 81 78  Resp: 19 18 17 18   Temp: 99.4 F (37.4 C) 99.5 F (37.5 C) 99.3 F (37.4 C) 99.6 F (37.6 C)  TempSrc: Oral Oral Oral Oral  SpO2: (!) 89% 93% 96% 96%  Weight:      Height:        Intake/Output Summary (Last 24 hours) at 03/06/2021 0658 Last data filed at 03/06/2021 0046 Gross per 24 hour  Intake 50 ml  Output --  Net 50 ml    Filed Weights   03/03/21 0217  Weight: 44 kg    Patient BMI: Body mass index is 18.94 kg/m.   Physical Exam:  General: awake, alert, NAD HEENT: atraumatic, clear conjunctiva, anicteric sclera, MMM, hearing grossly normal Respiratory: normal respiratory effort. Cardiovascular: normal S1/S2, RRR, no JVD, murmurs, quick capillary refill  Gastrointestinal: soft, NT, ND. LLQ abscess incision is without erythema. Iodoform packing in place.  Nervous: A&O x3. no gross focal neurologic deficits, normal speech Extremities: moves all equally, no edema, normal tone Skin: dry, intact, normal temperature, normal color. No rashes, lesions or ulcers on exposed skin Psychiatry: normal affect  Labs   I have personally reviewed following labs and imaging studies Admission on 03/02/2021  Component Date Value Ref Range Status   Lipase 03/02/2021 254 (H)  11 - 51 U/L Final    Sodium 03/02/2021 134 (L)  135 - 145 mmol/L Final   Potassium 03/02/2021 3.3 (L)  3.5 - 5.1 mmol/L Final   Chloride 03/02/2021 97 (L)  98 - 111 mmol/L Final   CO2 03/02/2021 27  22 - 32 mmol/L Final   Glucose, Bld 03/02/2021 242 (H)  70 - 99 mg/dL Final   BUN 03/02/2021 10  8 - 23 mg/dL Final   Creatinine, Ser 03/02/2021 0.51  0.44 - 1.00 mg/dL Final   Calcium 03/02/2021 8.0 (L)  8.9 - 10.3 mg/dL Final   Total Protein 03/02/2021 6.8  6.5 - 8.1 g/dL Final   Albumin 03/02/2021 2.1 (L)  3.5 - 5.0 g/dL Final   AST 03/02/2021 15  15 - 41 U/L Final   ALT 03/02/2021 15  0 - 44 U/L Final   Alkaline Phosphatase 03/02/2021 99  38 - 126 U/L Final   Total Bilirubin 03/02/2021 0.4  0.3 - 1.2 mg/dL Final   GFR, Estimated 03/02/2021 >60  >60 mL/min Final   Anion gap 03/02/2021 10  5 - 15 Final   WBC 03/02/2021 8.7  4.0 - 10.5 K/uL Final   RBC 03/02/2021  3.64 (L)  3.87 - 5.11 MIL/uL Final   Hemoglobin 03/02/2021 11.0 (L)  12.0 - 15.0 g/dL Final   HCT 03/02/2021 33.2 (L)  36.0 - 46.0 % Final   MCV 03/02/2021 91.2  80.0 - 100.0 fL Final   MCH 03/02/2021 30.2  26.0 - 34.0 pg Final   MCHC 03/02/2021 33.1  30.0 - 36.0 g/dL Final   RDW 03/02/2021 13.4  11.5 - 15.5 % Final   Platelets 03/02/2021 552 (H)  150 - 400 K/uL Final   nRBC 03/02/2021 0.0  0.0 - 0.2 % Final   SARS Coronavirus 2 by RT PCR 03/02/2021 NEGATIVE  NEGATIVE Final   Influenza A by PCR 03/02/2021 NEGATIVE  NEGATIVE Final   Influenza B by PCR 03/02/2021 NEGATIVE  NEGATIVE Final   Sodium 03/03/2021 135  135 - 145 mmol/L Final   Potassium 03/03/2021 2.9 (L)  3.5 - 5.1 mmol/L Final   Chloride 03/03/2021 99  98 - 111 mmol/L Final   CO2 03/03/2021 30  22 - 32 mmol/L Final   Glucose, Bld 03/03/2021 95  70 - 99 mg/dL Final   BUN 03/03/2021 7 (L)  8 - 23 mg/dL Final   Creatinine, Ser 03/03/2021 0.43 (L)  0.44 - 1.00 mg/dL Final   Calcium 03/03/2021 7.9 (L)  8.9 - 10.3 mg/dL Final   Total Protein 03/03/2021 6.7  6.5 - 8.1 g/dL Final   Albumin  03/03/2021 2.1 (L)  3.5 - 5.0 g/dL Final   AST 03/03/2021 11 (L)  15 - 41 U/L Final   ALT 03/03/2021 13  0 - 44 U/L Final   Alkaline Phosphatase 03/03/2021 95  38 - 126 U/L Final   Total Bilirubin 03/03/2021 0.4  0.3 - 1.2 mg/dL Final   GFR, Estimated 03/03/2021 >60  >60 mL/min Final   Anion gap 03/03/2021 6  5 - 15 Final   WBC 03/03/2021 9.9  4.0 - 10.5 K/uL Final   RBC 03/03/2021 3.29 (L)  3.87 - 5.11 MIL/uL Final   Hemoglobin 03/03/2021 9.7 (L)  12.0 - 15.0 g/dL Final   HCT 03/03/2021 29.7 (L)  36.0 - 46.0 % Final   MCV 03/03/2021 90.3  80.0 - 100.0 fL Final   MCH 03/03/2021 29.5  26.0 - 34.0 pg Final   MCHC 03/03/2021 32.7  30.0 - 36.0 g/dL Final   RDW 03/03/2021 13.3  11.5 - 15.5 % Final   Platelets 03/03/2021 529 (H)  150 - 400 K/uL Final   nRBC 03/03/2021 0.0  0.0 - 0.2 % Final   Glucose-Capillary 03/03/2021 305 (H)  70 - 99 mg/dL Final   Glucose-Capillary 03/03/2021 134 (H)  70 - 99 mg/dL Final   Glucose-Capillary 03/03/2021 69 (L)  70 - 99 mg/dL Final   Glucose-Capillary 03/03/2021 136 (H)  70 - 99 mg/dL Final   Specimen Description 03/03/2021    Final                   Value:ABSCESS Performed at [facility], [address], [place], [place] [address]    Special Requests 03/03/2021    Final                   Value:LEFT INGUINAL Performed at [facility], [address] [place], [place] [address]    Gram Stain 03/03/2021    Final                   Value:ABUNDANT WBC PRESENT, PREDOMINANTLY PMN FEW Lonell Grandchild  VARIABLE ROD RARE GRAM POSITIVE COCCI RARE YEAST Performed at [facility], [place] [address], [place], [place] [address]    Culture 03/03/2021    Final                   Value:ABUNDANT ESCHERICHIA COLI MODERATE KLEBSIELLA PNEUMONIAE MIXED ANAEROBIC FLORA PRESENT.  CALL LAB IF FURTHER IID REQUIRED.    Report Status 03/03/2021 PENDING   Incomplete   Organism ID, Bacteria 03/03/2021 ESCHERICHIA COLI   Final   Glucose-Capillary 03/03/2021 135 (H)   70 - 99 mg/dL Final   Glucose-Capillary 03/03/2021 128 (H)  70 - 99 mg/dL Final   Lipase 03/04/2021 40  11 - 51 U/L Final   Sodium 03/04/2021 139  135 - 145 mmol/L Final   Potassium 03/04/2021 3.9  3.5 - 5.1 mmol/L Final   Chloride 03/04/2021 103  98 - 111 mmol/L Final   CO2 03/04/2021 27  22 - 32 mmol/L Final   Glucose, Bld 03/04/2021 90  70 - 99 mg/dL Final   BUN 03/04/2021 5 (L)  8 - 23 mg/dL Final   Creatinine, Ser 03/04/2021 0.55  0.44 - 1.00 mg/dL Final   Calcium 03/04/2021 8.2 (L)  8.9 - 10.3 mg/dL Final   Total Protein 03/04/2021 6.6  6.5 - 8.1 g/dL Final   Albumin 03/04/2021 2.2 (L)  3.5 - 5.0 g/dL Final   AST 03/04/2021 17  15 - 41 U/L Final   ALT 03/04/2021 13  0 - 44 U/L Final   Alkaline Phosphatase 03/04/2021 93  38 - 126 U/L Final   Total Bilirubin 03/04/2021 0.4  0.3 - 1.2 mg/dL Final   GFR, Estimated 03/04/2021 >60  >60 mL/min Final   Anion gap 03/04/2021 9  5 - 15 Final   WBC 03/04/2021 8.5  4.0 - 10.5 K/uL Final   RBC 03/04/2021 3.33 (L)  3.87 - 5.11 MIL/uL Final   Hemoglobin 03/04/2021 9.9 (L)  12.0 - 15.0 g/dL Final   HCT 03/04/2021 30.7 (L)  36.0 - 46.0 % Final   MCV 03/04/2021 92.2  80.0 - 100.0 fL Final   MCH 03/04/2021 29.7  26.0 - 34.0 pg Final   MCHC 03/04/2021 32.2  30.0 - 36.0 g/dL Final   RDW 03/04/2021 13.7  11.5 - 15.5 % Final   Platelets 03/04/2021 550 (H)  150 - 400 K/uL Final   nRBC 03/04/2021 0.0  0.0 - 0.2 % Final   Glucose-Capillary 03/03/2021 169 (H)  70 - 99 mg/dL Final   Glucose-Capillary 03/04/2021 206 (H)  70 - 99 mg/dL Final   Glucose-Capillary 03/04/2021 87  70 - 99 mg/dL Final   Glucose-Capillary 03/04/2021 93  70 - 99 mg/dL Final   Glucose-Capillary 03/04/2021 181 (H)  70 - 99 mg/dL Final   Glucose-Capillary 03/04/2021 182 (H)  70 - 99 mg/dL Final   Glucose-Capillary 03/04/2021 248 (H)  70 - 99 mg/dL Final   Glucose-Capillary 03/04/2021 216 (H)  70 - 99 mg/dL Final   Glucose-Capillary 03/05/2021 238 (H)  70 - 99 mg/dL Final    Glucose-Capillary 03/05/2021 223 (H)  70 - 99 mg/dL Final   Glucose-Capillary 03/05/2021 225 (H)  70 - 99 mg/dL Final   Specimen Description 03/05/2021    Final                   Value:ABDOMEN Performed at [facility], [address], [place], [place] [address]    Special Requests 03/05/2021 NONE   Final  Gram Stain 03/05/2021    Final                   Value:FEW WBC PRESENT, PREDOMINANTLY PMN NO ORGANISMS SEEN Performed at [facility], [place] [address] [place], [place] [address]    Culture 03/05/2021 PENDING   Incomplete   Report Status 03/05/2021 PENDING   Incomplete   Glucose-Capillary 03/05/2021 276 (H)  70 - 99 mg/dL Final   Glucose-Capillary 03/05/2021 276 (H)  70 - 99 mg/dL Final   WBC 03/06/2021 10.9 (H)  4.0 - 10.5 K/uL Final   RBC 03/06/2021 3.19 (L)  3.87 - 5.11 MIL/uL Final   Hemoglobin 03/06/2021 9.5 (L)  12.0 - 15.0 g/dL Final   HCT 03/06/2021 28.8 (L)  36.0 - 46.0 % Final   MCV 03/06/2021 90.3  80.0 - 100.0 fL Final   MCH 03/06/2021 29.8  26.0 - 34.0 pg Final   MCHC 03/06/2021 33.0  30.0 - 36.0 g/dL Final   RDW 03/06/2021 13.6  11.5 - 15.5 % Final   Platelets 03/06/2021 591 (H)  150 - 400 K/uL Final   nRBC 03/06/2021 0.0  0.0 - 0.2 % Final   Glucose-Capillary 03/05/2021 242 (H)  70 - 99 mg/dL Final   Glucose-Capillary 03/06/2021 247 (H)  70 - 99 mg/dL Final    Imaging Studies  CT ABDOMEN PELVIS W CONTRAST  Result Date: 03/05/2021 CLINICAL DATA:  Follow-up diverticulitis and diverticular abscess. EXAM: CT ABDOMEN AND PELVIS WITH CONTRAST TECHNIQUE: Multidetector CT imaging of the abdomen and pelvis was performed using the standard protocol following bolus administration of intravenous contrast. RADIATION DOSE REDUCTION: This exam was performed according to the departmental dose-optimization program which includes automated exposure control, adjustment of the mA and/or kV according to patient size and/or use of iterative reconstruction technique.  CONTRAST:  130mL OMNIPAQUE IOHEXOL 300 MG/ML  SOLN COMPARISON:  03/02/2021 FINDINGS: Lower chest: Peripheral atelectasis and airspace disease within the posterior right lung base. No pleural effusion identified. Hepatobiliary: No focal liver abnormality is seen. No gallstones, gallbladder wall thickening, or biliary dilatation. Pancreas: Status post distal pancreatectomy. Unchanged appearance of multiloculated cystic mass involving the head of pancreas/uncinate process. On today's study this has a maximum dimension on the axial images a 4.5 cm, unchanged from previous exam. Spleen: Status post splenectomy. Adrenals/Urinary Tract: Normal adrenal glands. No signs of nephrolithiasis, hydronephrosis or mass. Bladder appears normal for degree of distension. Stomach/Bowel: Stomach appears within normal limits. Small bowel loops are unremarkable. Abnormal appearance of the proximal sigmoid colon is again noted with diffuse wall thickening and inflammation. The inflamed segment of sigmoid colon measures 9.3 x 6.3 cm, image 25/6. This is compared with the same previously. As mentioned previously there are signs of intramural abscess formation. Previously measured intramural abscess measures 2.5 by 1.5 cm, image 23/6. Unchanged from previous exam. There is evidence of fistulous communication of this abscess with a peripherally enhancing gas and fluid collection within the superficial soft tissues overlying the left iliac fossa. This fluid collection measures 4.5 by 2.3 cm, image 15/6. Previously 3.9 x 2.2 cm. This gives rise to a fistulous tract which communicates with a abscess measuring 5.3 x 3.8 cm, image 22/6. Previously the left inguinal abscess measured 6.8 x 3.6 cm. There is no signs of bowel obstruction. Vascular/Lymphatic: Aortic atherosclerosis without aneurysm. No abdominopelvic adenopathy identified Reproductive: Uterus and bilateral adnexa are unremarkable. Other: No significant free fluid or fluid collections  within the abdomen or pelvis. Musculoskeletal: No acute  or significant osseous findings. IMPRESSION: 1. No significant change in appearance of the sigmoid colon with diffuse wall thickening and inflammation compatible with diverticulitis. 2. Again seen is intramural abscess formation within the affected loop of proximal sigmoid colon. As noted previously there is fistulous communication with a superficial fluid collection overlying the left iliac fossa. The fluid collection overlying the left iliac fossa gives rise to a larger fluid collection extending into the left inguinal region as noted previously. These fluid collections do not appear significantly improved when compared with study from 03/02/2021. No new fluid collections identified. 3. Stable appearance of multiloculated cystic mass involving the head of pancreas/uncinate process. 4. Peripheral atelectasis and airspace disease within the posterior right lung base. 5. Aortic Atherosclerosis (ICD10-I70.0). Electronically Signed   By: Kerby Moors M.D.   On: 03/05/2021 12:52    Medications   Scheduled Meds:  benazepril  10 mg Oral Daily   feeding supplement  1 Container Oral TID BM   gabapentin  600 mg Oral QHS   lipase/protease/amylase  12,000 Units Oral TID   mirtazapine  15 mg Oral QHS   rOPINIRole  1 mg Oral BID   vancomycin  125 mg Oral QID   No recently discontinued medications to reconcile  LOS: 3 days   Richarda Osmond, DO [facility] 03/06/2021, 6:58 AM   Available by Epic secure chat 7AM-7PM. If 7PM-7AM, please contact night-coverage Refer to [email or web address] to contact the [facility] Attending or Consulting provider for this pt

## 2021-03-06 NOTE — Progress Notes (Addendum)
CC: Diverticulitis Subjective: Continues to drain from the left groin ps White count went up a little bit this morning. Fevers have subsided.  Objective: Vital signs in last 24 hours: Temp:  [98 F (36.7 C)-99.6 F (37.6 C)] 99 F (37.2 C) (01/23 0843) Pulse Rate:  [78-92] 78 (01/23 0843) Resp:  [16-19] 17 (01/23 0843) BP: (120-155)/(70-94) 153/80 (01/23 0843) SpO2:  [89 %-97 %] 96 % (01/23 0843) Last BM Date: 03/05/21  Intake/Output from previous day: 01/22 0701 - 01/23 0700 In: 50 [IV Piggyback:50] Out: -  Intake/Output this shift: No intake/output data recorded.  Physical exam: NAD anxious Abd: soft, nt , no peritonitis Ext: Left groin upper thigh I was able to express more pus today and instructed nurse to perform dressing change and packing.  Lab Results: CBC  Recent Labs    03/04/21 0516 03/06/21 0424  WBC 8.5 10.9*  HGB 9.9* 9.5*  HCT 30.7* 28.8*  PLT 550* 591*   BMET Recent Labs    03/04/21 0516  NA 139  K 3.9  CL 103  CO2 27  GLUCOSE 90  BUN 5*  CREATININE 0.55  CALCIUM 8.2*   PT/INR No results for input(s): LABPROT, INR in the last 72 hours. ABG No results for input(s): PHART, HCO3 in the last 72 hours.  Invalid input(s): PCO2, PO2  Studies/Results: CT ABDOMEN PELVIS W CONTRAST  Result Date: 03/05/2021 CLINICAL DATA:  Follow-up diverticulitis and diverticular abscess. EXAM: CT ABDOMEN AND PELVIS WITH CONTRAST TECHNIQUE: Multidetector CT imaging of the abdomen and pelvis was performed using the standard protocol following bolus administration of intravenous contrast. RADIATION DOSE REDUCTION: This exam was performed according to the departmental dose-optimization program which includes automated exposure control, adjustment of the mA and/or kV according to patient size and/or use of iterative reconstruction technique. CONTRAST:  146mL OMNIPAQUE IOHEXOL 300 MG/ML  SOLN COMPARISON:  03/02/2021 FINDINGS: Lower chest: Peripheral atelectasis and  airspace disease within the posterior right lung base. No pleural effusion identified. Hepatobiliary: No focal liver abnormality is seen. No gallstones, gallbladder wall thickening, or biliary dilatation. Pancreas: Status post distal pancreatectomy. Unchanged appearance of multiloculated cystic mass involving the head of pancreas/uncinate process. On today's study this has a maximum dimension on the axial images a 4.5 cm, unchanged from previous exam. Spleen: Status post splenectomy. Adrenals/Urinary Tract: Normal adrenal glands. No signs of nephrolithiasis, hydronephrosis or mass. Bladder appears normal for degree of distension. Stomach/Bowel: Stomach appears within normal limits. Small bowel loops are unremarkable. Abnormal appearance of the proximal sigmoid colon is again noted with diffuse wall thickening and inflammation. The inflamed segment of sigmoid colon measures 9.3 x 6.3 cm, image 25/6. This is compared with the same previously. As mentioned previously there are signs of intramural abscess formation. Previously measured intramural abscess measures 2.5 by 1.5 cm, image 23/6. Unchanged from previous exam. There is evidence of fistulous communication of this abscess with a peripherally enhancing gas and fluid collection within the superficial soft tissues overlying the left iliac fossa. This fluid collection measures 4.5 by 2.3 cm, image 15/6. Previously 3.9 x 2.2 cm. This gives rise to a fistulous tract which communicates with a abscess measuring 5.3 x 3.8 cm, image 22/6. Previously the left inguinal abscess measured 6.8 x 3.6 cm. There is no signs of bowel obstruction. Vascular/Lymphatic: Aortic atherosclerosis without aneurysm. No abdominopelvic adenopathy identified Reproductive: Uterus and bilateral adnexa are unremarkable. Other: No significant free fluid or fluid collections within the abdomen or pelvis. Musculoskeletal: No acute or significant osseous  findings. IMPRESSION: 1. No significant change  in appearance of the sigmoid colon with diffuse wall thickening and inflammation compatible with diverticulitis. 2. Again seen is intramural abscess formation within the affected loop of proximal sigmoid colon. As noted previously there is fistulous communication with a superficial fluid collection overlying the left iliac fossa. The fluid collection overlying the left iliac fossa gives rise to a larger fluid collection extending into the left inguinal region as noted previously. These fluid collections do not appear significantly improved when compared with study from 03/02/2021. No new fluid collections identified. 3. Stable appearance of multiloculated cystic mass involving the head of pancreas/uncinate process. 4. Peripheral atelectasis and airspace disease within the posterior right lung base. 5. Aortic Atherosclerosis (ICD10-I70.0). Electronically Signed   By: Kerby Moors M.D.   On: 03/05/2021 12:52    Anti-infectives: Anti-infectives (From admission, onward)    Start     Dose/Rate Route Frequency Ordered Stop   03/04/21 1000  piperacillin-tazobactam (ZOSYN) IVPB 3.375 g        3.375 g 12.5 mL/hr over 240 Minutes Intravenous Every 8 hours 03/04/21 0940     03/03/21 1000  vancomycin (VANCOCIN) capsule 125 mg        125 mg Oral 4 times daily 03/03/21 0440 03/13/21 0959   03/03/21 0045  piperacillin-tazobactam (ZOSYN) IVPB 3.375 g        3.375 g 100 mL/hr over 30 Minutes Intravenous  Once 03/03/21 0031 03/03/21 0115       Assessment/Plan:  Complex and recurrent diverticulitis with abscess now draining to the groin.  Continue packing.  If she fails this I&D and there is antibiotics she may need a Hartman's for definitive control of infection.  Discussed with her in detail and she understands. C. difficile certainly complicates his situation At This time she does not need emergent surgical intervention.  We will continue to follow. Caroleen Hamman, MD, [facility]  03/06/2021

## 2021-03-06 NOTE — Progress Notes (Signed)
Tears, silence, prayers. Ch will return.
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
## 2021-03-07 DIAGNOSIS — R1084 Generalized abdominal pain: Secondary | ICD-10-CM

## 2021-03-07 DIAGNOSIS — K5720 Diverticulitis of large intestine with perforation and abscess without bleeding: Secondary | ICD-10-CM | POA: Diagnosis not present

## 2021-03-07 DIAGNOSIS — A0472 Enterocolitis due to Clostridium difficile, not specified as recurrent: Secondary | ICD-10-CM | POA: Diagnosis not present

## 2021-03-07 LAB — COMPREHENSIVE METABOLIC PANEL
ALT: 9 U/L (ref 0–44)
AST: 13 U/L — ABNORMAL LOW (ref 15–41)
Albumin: 1.9 g/dL — ABNORMAL LOW (ref 3.5–5.0)
Alkaline Phosphatase: 78 U/L (ref 38–126)
Anion gap: 9 (ref 5–15)
BUN: <5 mg/dL — ABNORMAL LOW (ref 8–23)
CO2: 29 mmol/L (ref 22–32)
Calcium: 7.7 mg/dL — ABNORMAL LOW (ref 8.9–10.3)
Chloride: 100 mmol/L (ref 98–111)
Creatinine, Ser: 0.33 mg/dL — ABNORMAL LOW (ref 0.44–1.00)
GFR, Estimated: >60 mL/min (ref >60)
Glucose, Bld: 208 mg/dL — ABNORMAL HIGH (ref 70–99)
Potassium: 2.7 mmol/L — CL (ref 3.5–5.1)
Sodium: 138 mmol/L (ref 135–145)
Total Bilirubin: 0.3 mg/dL (ref 0.3–1.2)
Total Protein: 6.4 g/dL — ABNORMAL LOW (ref 6.5–8.1)

## 2021-03-07 LAB — CBC
HCT: 29.5 % — ABNORMAL LOW (ref 36.0–46.0)
Hemoglobin: 10 g/dL — ABNORMAL LOW (ref 12.0–15.0)
MCH: 30.6 pg (ref 26.0–34.0)
MCHC: 33.9 g/dL (ref 30.0–36.0)
MCV: 90.2 fL (ref 80.0–100.0)
Platelets: 646 10*3/uL — ABNORMAL HIGH (ref 150–400)
RBC: 3.27 MIL/uL — ABNORMAL LOW (ref 3.87–5.11)
RDW: 13.9 % (ref 11.5–15.5)
WBC: 8.5 10*3/uL (ref 4.0–10.5)
nRBC: 0 % (ref 0.0–0.2)

## 2021-03-07 LAB — GLUCOSE, CAPILLARY
Glucose-Capillary: 197 mg/dL — ABNORMAL HIGH (ref 70–99)
Glucose-Capillary: 190 mg/dL — ABNORMAL HIGH (ref 70–99)
Glucose-Capillary: 190 mg/dL — ABNORMAL HIGH (ref 70–99)
Glucose-Capillary: 326 mg/dL — ABNORMAL HIGH (ref 70–99)
Glucose-Capillary: 209 mg/dL — ABNORMAL HIGH (ref 70–99)

## 2021-03-07 MED ORDER — POTASSIUM CHLORIDE CRYS ER 20 MEQ PO TBCR
40.0000 meq | EXTENDED_RELEASE_TABLET | Freq: Two times a day (BID) | ORAL | Status: AC
  Administered 2021-03-07 (×2): 40 meq via ORAL
  Filled 2021-03-07 (×2): qty 2

## 2021-03-07 MED ORDER — BENAZEPRIL HCL 20 MG PO TABS
20.0000 mg | ORAL_TABLET | Freq: Every day | ORAL | Status: DC
  Administered 2021-03-07 – 2021-03-14 (×7): 20 mg via ORAL
  Filled 2021-03-07 (×8): qty 1

## 2021-03-07 NOTE — Progress Notes (Signed)
CH saw PT the other day and wanted to check on her again. PT seemed a bit more upbeat.

## 2021-03-07 NOTE — Progress Notes (Signed)
Pharmacy Antibiotic Note  Brittany [name] is a 61 y.o. female presenting with abdominal pain. Admitted on 03/02/2021 with  intraabdominal infection .  Pharmacy has been consulted for Zosyn dosing.  PMH includes lupus, cancer, DM, HTN with WBC 8.5. CT abdomen showing colonic diverticulosis. Also with left inguinal abscess, s/p aspiration.   Noted cephalosporin allergy with itching. Received pip/tazo x 1 1/20 @0045 . Scr stable and consistent with baseline.   -C. diff 02/14/21 (previous admission)-on PO Vancomycin  Plan: Zosyn 3.375g IV q8h (4 hour infusion). Monitor renal function, cultures, clinical course.   Height: 5' (152.4 cm) Weight: 44 kg (97 lb) IBW/kg (Calculated) : 45.5  Temp (24hrs), Avg:98.9 F (37.2 C), Min:97.9 F (36.6 C), Max:100 F (37.8 C)  Recent Labs  Lab 03/02/21 1448 03/03/21 0636 03/04/21 0516 03/06/21 0424 03/07/21 0621  WBC 8.7 9.9 8.5 10.9* 8.5  CREATININE 0.51 0.43* 0.55  --  0.33*     Estimated Creatinine Clearance: 51.3 mL/min (A) (by C-G formula based on SCr of 0.33 mg/dL (L)).    Allergies  Allergen Reactions   Cephalosporins Itching   Hydromorphone Itching   Keflin [Cephalothin] Itching   Lactose Intolerance (Gi) Diarrhea    Antimicrobials this admission: Zosyn 1/20 >>    Dose adjustments this admission: N/a  Microbiology results: Left inguinal abscess: few gram variable rod, few gram positive cocci, rare yeast =Abscess cx 1/20: E.coli and Klebsiella pneumoniae    S = cefazolin, Ceftriaxone, Zosyn   Thank you for allowing pharmacy to be a part of this patients care.  Ramina Hulet A, PharmD 03/07/2021 11:11 AM

## 2021-03-07 NOTE — Progress Notes (Addendum)
PROGRESS NOTE  Brittany [name]    DOB: [birth year], 61 y.o.  CLE:[id number]  PCP: [facility], [facility]   Code Status: Full Code   DOA: 03/02/2021   LOS: 4  Brief Narrative of Current Hospitalization  Brittany [name] is a 61 y.o. 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  C. difficile PCR positive on 02/14/2021 treated with vancomycin. They presented from home to the ED on 03/02/2021 with LLQ pain associated with N/V and diarrhea x several days. In the ED, it was found that they had recurrent colonic diverticular abscess as seen on abdominal CT. They were treated with IV zosyn, analgesia, antiemetics and supportive care. She was restarted on oral vancomycin for continued C.diff. Patient was admitted to medicine service for further workup and management of recurrent colonic diverticular abscess as outlined in detail below.  1/20 underwent percutaneous drainage of superficial abscess by IR. 1/22 underwent I&D of superficial abscess as repeat CT did not show significant improvement.   03/07/21 -stable, improved  Assessment & Plan  Principal Problem:   Colonic diverticular abscess Active Problems:   Discoid lupus erythematosus   Essential hypertension   IPMN (intraductal papillary mucinous neoplasm)   Restless leg syndrome   Depression   Pancreatic cyst   Elevated lipase   History of Clostridium difficile colitis   Hyperglycemia due to type 2 diabetes mellitus ([place])   Abscess  Colonic diverticular abscess, recurrent- s/p superficial abscess aspiration 1/20. I&D 1/22. Cultures showing e coli with sensitivities to zosyn. Patient has cephalosporin allergy.  Afebrile about 24 hours. Incision from I&D without erythema or drainage. Iodoform packing in place. repeat abdominal CT 1/22 not showing significant improvement. - general surgery following, appreciate recs  - surgical intervention not planned currently -IV Zosyn, pain control, IV antiemetics  Hypokalemia- K+ 2.7 today from ongoing diarrhea - 24mEq x2 today - CMP am  IPMN (intraductal papillary mucinous neoplasm) s/p partial pancreatectomy and splenectomy- contributing to chronic diarrhea - Followed by oncology with serial CTs - Continue Pancrease at increased dose   Multifactorial diarrhea- Suspect persistent C. difficile infection- continues to have frequent loose bms but states that it is improved today. Clostridium difficile infection 02/14/2021. - continue oral vancomycin- complete course 10 days -Consider ID consult for additional recommendations   Hyperglycemia due to type 2 diabetes mellitus ([place])- borderline hypoglycemia while she was NPO but is now having elevated BS with diet added.  - sSSI   Discoid lupus erythematosus- no home medication   Essential hypertension- chronic, stable -Continue benazepril at increased dose.     Restless leg syndrome- chronic, stable on home medications - Continue ropinirole   Elevated lipase Pancreatic cyst - Stable cyst on CT of pancreas  Normocytic anemia   thrombocythemia-  - CBC am  DVT prophylaxis: SCDs Start: 03/03/21 0145   Diet:  Diet Orders (From admission, onward)     Start     Ordered   03/06/21 0818  Diet clear liquid Room service appropriate? Yes; Fluid consistency: Thin  Diet effective now       Question Answer Comment  Room service appropriate? Yes   Fluid consistency: Thin      03/06/21 0817  Subjective 03/07/21    Pt reports continued mild pain at abdomen and back diffusely. States her episodes of diarrhea have decreased since yesterday. She wants a solid diet. Asks for scrambled eggs every  day  Disposition Plan & Communication  Patient status: Inpatient  Admitted From: Home Disposition: Home Anticipated discharge date: TBD  Family Communication: none  Consults, Procedures, Significant Events  Consultants:  IR General surgery  Procedures/significant events:  Percutaneous drainage of abscess 1/20 I&D of superficial abscess 1/22  Antimicrobials:  Anti-infectives (From admission, onward)    Start     Dose/Rate Route Frequency Ordered Stop   03/04/21 1000  piperacillin-tazobactam (ZOSYN) IVPB 3.375 g        3.375 g 12.5 mL/hr over 240 Minutes Intravenous Every 8 hours 03/04/21 0940     03/03/21 1000  vancomycin (VANCOCIN) capsule 125 mg        125 mg Oral 4 times daily 03/03/21 0440 03/13/21 0959   03/03/21 0045  piperacillin-tazobactam (ZOSYN) IVPB 3.375 g        3.375 g 100 mL/hr over 30 Minutes Intravenous  Once 03/03/21 0031 03/03/21 0115       Objective   Vitals:   03/06/21 1607 03/06/21 2043 03/06/21 2321 03/07/21 0556  BP: (!) 147/86 (!) 137/91 (!) 153/91 (!) 173/91  Pulse: 73 73 73 75  Resp: 18 18 18 18   Temp: 98.8 F (37.1 C) 97.9 F (36.6 C) 98.4 F (36.9 C) 100 F (37.8 C)  TempSrc: Oral Oral Oral Oral  SpO2: 98% 100% 99%   Weight:      Height:        Intake/Output Summary (Last 24 hours) at 03/07/2021 0645 Last data filed at 03/06/2021 1302 Gross per 24 hour  Intake 320 ml  Output --  Net 320 ml    Filed Weights   03/03/21 0217  Weight: 44 kg    Patient BMI: Body mass index is 18.94 kg/m.   Physical Exam:  General: awake, alert, NAD HEENT: atraumatic, clear conjunctiva, anicteric sclera, MMM, hearing grossly normal Respiratory: normal respiratory effort. Cardiovascular: normal S1/S2, RRR, no JVD, murmurs, quick capillary refill  Gastrointestinal: soft, NT, ND. LLQ abscess incision is without erythema. Iodoform packing in place.  Nervous: A&O x3. no gross focal neurologic deficits, normal speech Extremities: moves all equally,  no edema, normal tone Skin: dry, intact, normal temperature, normal color. No rashes, lesions or ulcers on exposed skin Psychiatry: normal affect  Labs   I have personally reviewed following labs and imaging studies Admission on 03/02/2021  Component Date Value Ref Range Status   Lipase 03/02/2021 254 (H)  11 - 51 U/L Final   Sodium 03/02/2021 134 (L)  135 - 145 mmol/L Final   Potassium 03/02/2021 3.3 (L)  3.5 - 5.1 mmol/L Final   Chloride 03/02/2021 97 (L)  98 - 111 mmol/L Final   CO2 03/02/2021 27  22 - 32 mmol/L Final   Glucose, Bld 03/02/2021 242 (H)  70 - 99 mg/dL Final   BUN 03/02/2021 10  8 - 23 mg/dL Final   Creatinine, Ser 03/02/2021 0.51  0.44 - 1.00 mg/dL Final   Calcium 03/02/2021 8.0 (L)  8.9 - 10.3 mg/dL Final   Total Protein 03/02/2021 6.8  6.5 - 8.1 g/dL Final   Albumin 03/02/2021 2.1 (L)  3.5 - 5.0 g/dL Final   AST 03/02/2021 15  15 - 41 U/L Final   ALT 03/02/2021 15  0 - 44 U/L Final   Alkaline Phosphatase  03/02/2021 99  38 - 126 U/L Final   Total Bilirubin 03/02/2021 0.4  0.3 - 1.2 mg/dL Final   GFR, Estimated 03/02/2021 >60  >60 mL/min Final   Anion gap 03/02/2021 10  5 - 15 Final   WBC 03/02/2021 8.7  4.0 - 10.5 K/uL Final   RBC 03/02/2021 3.64 (L)  3.87 - 5.11 MIL/uL Final   Hemoglobin 03/02/2021 11.0 (L)  12.0 - 15.0 g/dL Final   HCT 03/02/2021 33.2 (L)  36.0 - 46.0 % Final   MCV 03/02/2021 91.2  80.0 - 100.0 fL Final   MCH 03/02/2021 30.2  26.0 - 34.0 pg Final   MCHC 03/02/2021 33.1  30.0 - 36.0 g/dL Final   RDW 03/02/2021 13.4  11.5 - 15.5 % Final   Platelets 03/02/2021 552 (H)  150 - 400 K/uL Final   nRBC 03/02/2021 0.0  0.0 - 0.2 % Final   SARS Coronavirus 2 by RT PCR 03/02/2021 NEGATIVE  NEGATIVE Final   Influenza A by PCR 03/02/2021 NEGATIVE  NEGATIVE Final   Influenza B by PCR 03/02/2021 NEGATIVE  NEGATIVE Final   Sodium 03/03/2021 135  135 - 145 mmol/L Final   Potassium 03/03/2021 2.9 (L)  3.5 - 5.1 mmol/L Final   Chloride 03/03/2021 99  98 - 111  mmol/L Final   CO2 03/03/2021 30  22 - 32 mmol/L Final   Glucose, Bld 03/03/2021 95  70 - 99 mg/dL Final   BUN 03/03/2021 7 (L)  8 - 23 mg/dL Final   Creatinine, Ser 03/03/2021 0.43 (L)  0.44 - 1.00 mg/dL Final   Calcium 03/03/2021 7.9 (L)  8.9 - 10.3 mg/dL Final   Total Protein 03/03/2021 6.7  6.5 - 8.1 g/dL Final   Albumin 03/03/2021 2.1 (L)  3.5 - 5.0 g/dL Final   AST 03/03/2021 11 (L)  15 - 41 U/L Final   ALT 03/03/2021 13  0 - 44 U/L Final   Alkaline Phosphatase 03/03/2021 95  38 - 126 U/L Final   Total Bilirubin 03/03/2021 0.4  0.3 - 1.2 mg/dL Final   GFR, Estimated 03/03/2021 >60  >60 mL/min Final   Anion gap 03/03/2021 6  5 - 15 Final   WBC 03/03/2021 9.9  4.0 - 10.5 K/uL Final   RBC 03/03/2021 3.29 (L)  3.87 - 5.11 MIL/uL Final   Hemoglobin 03/03/2021 9.7 (L)  12.0 - 15.0 g/dL Final   HCT 03/03/2021 29.7 (L)  36.0 - 46.0 % Final   MCV 03/03/2021 90.3  80.0 - 100.0 fL Final   MCH 03/03/2021 29.5  26.0 - 34.0 pg Final   MCHC 03/03/2021 32.7  30.0 - 36.0 g/dL Final   RDW 03/03/2021 13.3  11.5 - 15.5 % Final   Platelets 03/03/2021 529 (H)  150 - 400 K/uL Final   nRBC 03/03/2021 0.0  0.0 - 0.2 % Final   Glucose-Capillary 03/03/2021 305 (H)  70 - 99 mg/dL Final   Glucose-Capillary 03/03/2021 134 (H)  70 - 99 mg/dL Final   Glucose-Capillary 03/03/2021 69 (L)  70 - 99 mg/dL Final   Glucose-Capillary 03/03/2021 136 (H)  70 - 99 mg/dL Final   Specimen Description 03/03/2021    Final                   Value:ABSCESS Performed at [facility], [address], [place], [place] [address]    Special Requests 03/03/2021    Final  Value:LEFT INGUINAL Performed at [facility], [address] [place], [place] [address]    Gram Stain 03/03/2021    Final                   Value:ABUNDANT WBC PRESENT, PREDOMINANTLY PMN FEW GRAM VARIABLE ROD RARE GRAM POSITIVE COCCI RARE YEAST Performed at [facility], [place] [address], [place], [place] [address]     Culture 03/03/2021    Final                   Value:ABUNDANT ESCHERICHIA COLI MODERATE KLEBSIELLA PNEUMONIAE MIXED ANAEROBIC FLORA PRESENT.  CALL LAB IF FURTHER IID REQUIRED.    Report Status 03/03/2021 03/06/2021 FINAL   Final   Organism ID, Bacteria 03/03/2021 ESCHERICHIA COLI   Final   Organism ID, Bacteria 03/03/2021 KLEBSIELLA PNEUMONIAE   Final   Glucose-Capillary 03/03/2021 135 (H)  70 - 99 mg/dL Final   Glucose-Capillary 03/03/2021 128 (H)  70 - 99 mg/dL Final   Lipase 03/04/2021 40  11 - 51 U/L Final   Sodium 03/04/2021 139  135 - 145 mmol/L Final   Potassium 03/04/2021 3.9  3.5 - 5.1 mmol/L Final   Chloride 03/04/2021 103  98 - 111 mmol/L Final   CO2 03/04/2021 27  22 - 32 mmol/L Final   Glucose, Bld 03/04/2021 90  70 - 99 mg/dL Final   BUN 03/04/2021 5 (L)  8 - 23 mg/dL Final   Creatinine, Ser 03/04/2021 0.55  0.44 - 1.00 mg/dL Final   Calcium 03/04/2021 8.2 (L)  8.9 - 10.3 mg/dL Final   Total Protein 03/04/2021 6.6  6.5 - 8.1 g/dL Final   Albumin 03/04/2021 2.2 (L)  3.5 - 5.0 g/dL Final   AST 03/04/2021 17  15 - 41 U/L Final   ALT 03/04/2021 13  0 - 44 U/L Final   Alkaline Phosphatase 03/04/2021 93  38 - 126 U/L Final   Total Bilirubin 03/04/2021 0.4  0.3 - 1.2 mg/dL Final   GFR, Estimated 03/04/2021 >60  >60 mL/min Final   Anion gap 03/04/2021 9  5 - 15 Final   WBC 03/04/2021 8.5  4.0 - 10.5 K/uL Final   RBC 03/04/2021 3.33 (L)  3.87 - 5.11 MIL/uL Final   Hemoglobin 03/04/2021 9.9 (L)  12.0 - 15.0 g/dL Final   HCT 03/04/2021 30.7 (L)  36.0 - 46.0 % Final   MCV 03/04/2021 92.2  80.0 - 100.0 fL Final   MCH 03/04/2021 29.7  26.0 - 34.0 pg Final   MCHC 03/04/2021 32.2  30.0 - 36.0 g/dL Final   RDW 03/04/2021 13.7  11.5 - 15.5 % Final   Platelets 03/04/2021 550 (H)  150 - 400 K/uL Final   nRBC 03/04/2021 0.0  0.0 - 0.2 % Final   Glucose-Capillary 03/03/2021 169 (H)  70 - 99 mg/dL Final   Glucose-Capillary 03/04/2021 206 (H)  70 - 99 mg/dL Final   Glucose-Capillary  03/04/2021 87  70 - 99 mg/dL Final   Glucose-Capillary 03/04/2021 93  70 - 99 mg/dL Final   Glucose-Capillary 03/04/2021 181 (H)  70 - 99 mg/dL Final   Glucose-Capillary 03/04/2021 182 (H)  70 - 99 mg/dL Final   Glucose-Capillary 03/04/2021 248 (H)  70 - 99 mg/dL Final   Glucose-Capillary 03/04/2021 216 (H)  70 - 99 mg/dL Final   Glucose-Capillary 03/05/2021 238 (H)  70 - 99 mg/dL Final   Glucose-Capillary 03/05/2021 223 (H)  70 - 99 mg/dL Final  Glucose-Capillary 03/05/2021 225 (H)  70 - 99 mg/dL Final   Specimen Description 03/05/2021    Final                   Value:ABDOMEN Performed at [facility], [place] [place], [place] [address]    Special Requests 03/05/2021 NONE   Final   Gram Stain 03/05/2021    Final                   Value:FEW WBC PRESENT, PREDOMINANTLY PMN NO ORGANISMS SEEN    Culture 03/05/2021    Final                   Value:CULTURE REINCUBATED FOR BETTER GROWTH Performed at [facility], [place] [address], [place], [place] [address]    Report Status 03/05/2021 PENDING   Incomplete   Glucose-Capillary 03/05/2021 276 (H)  70 - 99 mg/dL Final   Glucose-Capillary 03/05/2021 276 (H)  70 - 99 mg/dL Final   WBC 03/06/2021 10.9 (H)  4.0 - 10.5 K/uL Final   RBC 03/06/2021 3.19 (L)  3.87 - 5.11 MIL/uL Final   Hemoglobin 03/06/2021 9.5 (L)  12.0 - 15.0 g/dL Final   HCT 03/06/2021 28.8 (L)  36.0 - 46.0 % Final   MCV 03/06/2021 90.3  80.0 - 100.0 fL Final   MCH 03/06/2021 29.8  26.0 - 34.0 pg Final   MCHC 03/06/2021 33.0  30.0 - 36.0 g/dL Final   RDW 03/06/2021 13.6  11.5 - 15.5 % Final   Platelets 03/06/2021 591 (H)  150 - 400 K/uL Final   nRBC 03/06/2021 0.0  0.0 - 0.2 % Final   Glucose-Capillary 03/05/2021 242 (H)  70 - 99 mg/dL Final   Glucose-Capillary 03/06/2021 247 (H)  70 - 99 mg/dL Final   Glucose-Capillary 03/06/2021 222 (H)  70 - 99 mg/dL Final   Glucose-Capillary 03/06/2021 214 (H)  70 - 99 mg/dL Final   WBC 03/07/2021 8.5  4.0 - 10.5  K/uL Final   RBC 03/07/2021 3.27 (L)  3.87 - 5.11 MIL/uL Final   Hemoglobin 03/07/2021 10.0 (L)  12.0 - 15.0 g/dL Final   HCT 03/07/2021 29.5 (L)  36.0 - 46.0 % Final   MCV 03/07/2021 90.2  80.0 - 100.0 fL Final   MCH 03/07/2021 30.6  26.0 - 34.0 pg Final   MCHC 03/07/2021 33.9  30.0 - 36.0 g/dL Final   RDW 03/07/2021 13.9  11.5 - 15.5 % Final   Platelets 03/07/2021 646 (H)  150 - 400 K/uL Final   nRBC 03/07/2021 0.0  0.0 - 0.2 % Final   Glucose-Capillary 03/06/2021 205 (H)  70 - 99 mg/dL Final   Glucose-Capillary 03/06/2021 230 (H)  70 - 99 mg/dL Final   Glucose-Capillary 03/07/2021 197 (H)  70 - 99 mg/dL Final   Glucose-Capillary 03/07/2021 190 (H)  70 - 99 mg/dL Final    Imaging Studies  CT ABDOMEN PELVIS W CONTRAST  Result Date: 03/05/2021 CLINICAL DATA:  Follow-up diverticulitis and diverticular abscess. EXAM: CT ABDOMEN AND PELVIS WITH CONTRAST TECHNIQUE: Multidetector CT imaging of the abdomen and pelvis was performed using the standard protocol following bolus administration of intravenous contrast. RADIATION DOSE REDUCTION: This exam was performed according to the departmental dose-optimization program which includes automated exposure control, adjustment of the mA and/or kV according to patient size and/or use of iterative reconstruction technique. CONTRAST:  133mL OMNIPAQUE IOHEXOL 300 MG/ML  SOLN COMPARISON:  03/02/2021 FINDINGS: Lower chest:  Peripheral atelectasis and airspace disease within the posterior right lung base. No pleural effusion identified. Hepatobiliary: No focal liver abnormality is seen. No gallstones, gallbladder wall thickening, or biliary dilatation. Pancreas: Status post distal pancreatectomy. Unchanged appearance of multiloculated cystic mass involving the head of pancreas/uncinate process. On today's study this has a maximum dimension on the axial images a 4.5 cm, unchanged from previous exam. Spleen: Status post splenectomy. Adrenals/Urinary Tract: Normal adrenal  glands. No signs of nephrolithiasis, hydronephrosis or mass. Bladder appears normal for degree of distension. Stomach/Bowel: Stomach appears within normal limits. Small bowel loops are unremarkable. Abnormal appearance of the proximal sigmoid colon is again noted with diffuse wall thickening and inflammation. The inflamed segment of sigmoid colon measures 9.3 x 6.3 cm, image 25/6. This is compared with the same previously. As mentioned previously there are signs of intramural abscess formation. Previously measured intramural abscess measures 2.5 by 1.5 cm, image 23/6. Unchanged from previous exam. There is evidence of fistulous communication of this abscess with a peripherally enhancing gas and fluid collection within the superficial soft tissues overlying the left iliac fossa. This fluid collection measures 4.5 by 2.3 cm, image 15/6. Previously 3.9 x 2.2 cm. This gives rise to a fistulous tract which communicates with a abscess measuring 5.3 x 3.8 cm, image 22/6. Previously the left inguinal abscess measured 6.8 x 3.6 cm. There is no signs of bowel obstruction. Vascular/Lymphatic: Aortic atherosclerosis without aneurysm. No abdominopelvic adenopathy identified Reproductive: Uterus and bilateral adnexa are unremarkable. Other: No significant free fluid or fluid collections within the abdomen or pelvis. Musculoskeletal: No acute or significant osseous findings. IMPRESSION: 1. No significant change in appearance of the sigmoid colon with diffuse wall thickening and inflammation compatible with diverticulitis. 2. Again seen is intramural abscess formation within the affected loop of proximal sigmoid colon. As noted previously there is fistulous communication with a superficial fluid collection overlying the left iliac fossa. The fluid collection overlying the left iliac fossa gives rise to a larger fluid collection extending into the left inguinal region as noted previously. These fluid collections do not appear  significantly improved when compared with study from 03/02/2021. No new fluid collections identified. 3. Stable appearance of multiloculated cystic mass involving the head of pancreas/uncinate process. 4. Peripheral atelectasis and airspace disease within the posterior right lung base. 5. Aortic Atherosclerosis (ICD10-I70.0). Electronically Signed   By: Kerby Moors M.D.   On: 03/05/2021 12:52    Medications   Scheduled Meds:  benazepril  10 mg Oral Daily   feeding supplement  1 Container Oral TID BM   gabapentin  600 mg Oral QHS   insulin aspart  0-6 Units Subcutaneous TID WC   lipase/protease/amylase  24,000 Units Oral TID   mirtazapine  15 mg Oral QHS   rOPINIRole  1 mg Oral BID   vancomycin  125 mg Oral QID   No recently discontinued medications to reconcile  LOS: 4 days   Richarda Osmond, DO [facility] 03/07/2021, 6:45 AM   Available by Epic secure chat 7AM-7PM. If 7PM-7AM, please contact night-coverage Refer to [email or web address] to contact the [facility] Attending or Consulting provider for this pt

## 2021-03-07 NOTE — Progress Notes (Signed)
[facility] SURGICAL PROGRESS NOTE (cpt [phone])  Hospital Day(s): 4.   Interval History: Patient seen and examined, no acute events or new complaints overnight. Patient reports she still has some LLQ discomfort but endorses this is improved. She denies fever, chills, nausea. Her leukocytosis is resolved this morning; 8.5K. Renal function normal; sCr - 0.33; UO - unmeasured. Hypokalemia to 2.7. She is on DYS 3 diet; tolerating. She continues on Zosyn. CX growing E coli and Klebsiella.   Review of Systems:  Constitutional: denies fever, chills  HEENT: denies cough or congestion  Respiratory: denies any shortness of breath  Cardiovascular: denies chest pain or palpitations  Gastrointestinal: + abdominal pain (improving), denied N/V, or diarrhea Genitourinary: denies burning with urination or urinary frequency  Vital signs in last 24 hours: [min-max] current  Temp:  [97.9 F (36.6 C)-100 F (37.8 C)] 99.2 F (37.3 C) (01/24 0740) Pulse Rate:  [73-86] 86 (01/24 0740) Resp:  [16-18] 16 (01/24 0740) BP: (137-178)/(86-100) 178/100 (01/24 0740) SpO2:  [97 %-100 %] 97 % (01/24 0740)     Height: 5' (152.4 cm) Weight: 44 kg BMI (Calculated): 18.94   Intake/Output last 2 shifts:  01/23 0701 - 01/24 0700 In: 320 [P.O.:320] Out: -    Physical Exam:  Constitutional: alert, cooperative and no distress  HENT: normocephalic without obvious abnormality  Eyes: PERRL, EOM's grossly intact and symmetric  Respiratory: breathing non-labored at rest  Cardiovascular: regular rate and sinus rhythm  Gastrointestinal: soft, LLQ discomfort, difficult to ascertain how tender she is, and non-distended, no rebound/guarding Integumentary: I&D to the LLQ, there is still purulent output  Musculoskeletal: no edema or wounds, motor and sensation grossly intact, NT    Labs:  CBC Latest Ref Rng & Units 03/07/2021 03/06/2021 03/04/2021  WBC 4.0 - 10.5 K/uL 8.5 10.9(H) 8.5  Hemoglobin 12.0 - 15.0 g/dL  10.0(L) 9.5(L) 9.9(L)  Hematocrit 36.0 - 46.0 % 29.5(L) 28.8(L) 30.7(L)  Platelets 150 - 400 K/uL 646(H) 591(H) 550(H)   CMP Latest Ref Rng & Units 03/07/2021 03/04/2021 03/03/2021  Glucose 70 - 99 mg/dL 208(H) 90 95  BUN 8 - 23 mg/dL <5(L) 5(L) 7(L)  Creatinine 0.44 - 1.00 mg/dL 0.33(L) 0.55 0.43(L)  Sodium 135 - 145 mmol/L 138 139 135  Potassium 3.5 - 5.1 mmol/L 2.7(LL) 3.9 2.9(L)  Chloride 98 - 111 mmol/L 100 103 99  CO2 22 - 32 mmol/L 29 27 30   Calcium 8.9 - 10.3 mg/dL 7.7(L) 8.2(L) 7.9(L)  Total Protein 6.5 - 8.1 g/dL 6.4(L) 6.6 6.7  Total Bilirubin 0.3 - 1.2 mg/dL 0.3 0.4 0.4  Alkaline Phos 38 - 126 U/L 78 93 95  AST 15 - 41 U/L 13(L) 17 11(L)  ALT 0 - 44 U/L 9 13 13      Imaging studies: No new pertinent imaging studies   Assessment/Plan: (ICD-10's: K79.92) 61 y.o. female with complex and recurrent diverticulitis with abscess which appears to now be draining from left groin   - Okay for diet today; NPO at midnight as a precaution - I have tentatively posed her for potential Hartmann's tomorrow (01/25). We will continue to closely reassess to determine need for this. She does appear clinically improved this morning, so we may be able to hold off potentially  - Continue IV Abx (Zosyn); follow up Cx - Monitor abdominal examination - Pain control prn; antiemetics prn   - Monitor leukocytosis; resolved   - Further management per primary service we will follow    All of the above  findings and recommendations were discussed with the patient, and the medical team, and all of patient's questions were answered to her expressed satisfaction.  -- Edison Simon, PA-C [facility] 03/07/2021, 9:19 AM [phone] M-F: 7am - 4pm

## 2021-03-08 ENCOUNTER — Encounter (HOSPITAL_COMMUNITY): Payer: Self-pay | Admitting: Certified Registered"

## 2021-03-08 ENCOUNTER — Encounter
Admission: EM | Disposition: A | Discharge status: Home Health | Payer: Self-pay | Source: Home / Self Care | Attending: Internal Medicine

## 2021-03-08 DIAGNOSIS — J9600 Acute respiratory failure, unspecified whether with hypoxia or hypercapnia: Secondary | ICD-10-CM

## 2021-03-08 DIAGNOSIS — A0472 Enterocolitis due to Clostridium difficile, not specified as recurrent: Secondary | ICD-10-CM | POA: Diagnosis not present

## 2021-03-08 DIAGNOSIS — K5720 Diverticulitis of large intestine with perforation and abscess without bleeding: Secondary | ICD-10-CM | POA: Diagnosis not present

## 2021-03-08 LAB — COMPREHENSIVE METABOLIC PANEL
ALT: 9 U/L (ref 0–44)
AST: 12 U/L — ABNORMAL LOW (ref 15–41)
Albumin: 1.9 g/dL — ABNORMAL LOW (ref 3.5–5.0)
Alkaline Phosphatase: 89 U/L (ref 38–126)
Anion gap: 7 (ref 5–15)
BUN: <5 mg/dL — ABNORMAL LOW (ref 8–23)
CO2: 28 mmol/L (ref 22–32)
Calcium: 7.9 mg/dL — ABNORMAL LOW (ref 8.9–10.3)
Chloride: 99 mmol/L (ref 98–111)
Creatinine, Ser: 0.56 mg/dL (ref 0.44–1.00)
GFR, Estimated: >60 mL/min (ref >60)
Glucose, Bld: 327 mg/dL — ABNORMAL HIGH (ref 70–99)
Potassium: 2.9 mmol/L — ABNORMAL LOW (ref 3.5–5.1)
Sodium: 134 mmol/L — ABNORMAL LOW (ref 135–145)
Total Bilirubin: 0.4 mg/dL (ref 0.3–1.2)
Total Protein: 6.5 g/dL (ref 6.5–8.1)

## 2021-03-08 LAB — BLOOD GAS, VENOUS
Acid-base deficit: 0.9 mmol/L (ref 0.0–2.0)
Bicarbonate: 25.8 mmol/L (ref 20.0–28.0)
O2 Saturation: 39.2 %
Patient temperature: 37
pCO2, Ven: 50 mmHg (ref 44.0–60.0)
pH, Ven: 7.32 (ref 7.250–7.430)

## 2021-03-08 LAB — CBC
HCT: 29.6 % — ABNORMAL LOW (ref 36.0–46.0)
Hemoglobin: 9.8 g/dL — ABNORMAL LOW (ref 12.0–15.0)
MCH: 29.8 pg (ref 26.0–34.0)
MCHC: 33.1 g/dL (ref 30.0–36.0)
MCV: 90 fL (ref 80.0–100.0)
Platelets: 687 10*3/uL — ABNORMAL HIGH (ref 150–400)
RBC: 3.29 MIL/uL — ABNORMAL LOW (ref 3.87–5.11)
RDW: 13.5 % (ref 11.5–15.5)
WBC: 10.3 10*3/uL (ref 4.0–10.5)
nRBC: 0 % (ref 0.0–0.2)

## 2021-03-08 LAB — GLUCOSE, CAPILLARY
Glucose-Capillary: 312 mg/dL — ABNORMAL HIGH (ref 70–99)
Glucose-Capillary: 289 mg/dL — ABNORMAL HIGH (ref 70–99)
Glucose-Capillary: 331 mg/dL — ABNORMAL HIGH (ref 70–99)
Glucose-Capillary: 94 mg/dL (ref 70–99)

## 2021-03-08 LAB — MAGNESIUM: Magnesium: 1.6 mg/dL — ABNORMAL LOW (ref 1.7–2.4)

## 2021-03-08 SURGERY — COLECTOMY, WITH COLOSTOMY CREATION
Anesthesia: General

## 2021-03-08 MED ORDER — MORPHINE SULFATE (PF) 2 MG/ML IV SOLN
2.0000 mg | INTRAVENOUS | Status: DC | PRN
Start: 2021-03-08 — End: 2021-03-12
  Administered 2021-03-08 – 2021-03-12 (×5): 2 mg via INTRAVENOUS
  Filled 2021-03-08 (×5): qty 1

## 2021-03-08 MED ORDER — ENSURE ENLIVE PO LIQD: 237.0000 mL | Freq: Two times a day (BID) | ORAL | Status: DC

## 2021-03-08 MED ORDER — ZINC OXIDE 40 % EX OINT
TOPICAL_OINTMENT | CUTANEOUS | Status: DC | PRN
  Filled 2021-03-08: qty 113

## 2021-03-08 MED ORDER — MIDAZOLAM HCL 2 MG/2ML IJ SOLN
INTRAMUSCULAR | Status: AC
  Filled 2021-03-08: qty 2

## 2021-03-08 MED ORDER — PHENYLEPHRINE HCL (PRESSORS) 10 MG/ML IV SOLN
INTRAVENOUS | Status: AC
  Filled 2021-03-08: qty 1

## 2021-03-08 MED ORDER — INSULIN GLARGINE-YFGN 100 UNIT/ML ~~LOC~~ SOLN
5.0000 [IU] | Freq: Two times a day (BID) | SUBCUTANEOUS | Status: DC
  Administered 2021-03-08 – 2021-03-10 (×4): 5 [IU] via SUBCUTANEOUS
  Filled 2021-03-08 (×9): qty 0.05

## 2021-03-08 MED ORDER — INSULIN GLARGINE-YFGN 100 UNIT/ML ~~LOC~~ SOLN
10.0000 [IU] | Freq: Two times a day (BID) | SUBCUTANEOUS | Status: DC
  Filled 2021-03-08 (×2): qty 0.1

## 2021-03-08 MED ORDER — MAGNESIUM SULFATE 2 GM/50ML IV SOLN
2.0000 g | Freq: Once | INTRAVENOUS | Status: AC
Start: 2021-03-08 — End: 2021-03-08
  Administered 2021-03-08: 14:00:00 2 g via INTRAVENOUS
  Filled 2021-03-08: qty 50

## 2021-03-08 MED ORDER — PROPOFOL 10 MG/ML IV BOLUS
INTRAVENOUS | Status: AC
  Filled 2021-03-08: qty 40

## 2021-03-08 MED ORDER — FENTANYL CITRATE (PF) 100 MCG/2ML IJ SOLN
INTRAMUSCULAR | Status: AC
  Filled 2021-03-08: qty 2

## 2021-03-08 MED ORDER — POTASSIUM CHLORIDE 20 MEQ PO PACK
20.0000 meq | PACK | Freq: Three times a day (TID) | ORAL | Status: DC
  Administered 2021-03-08 (×3): 20 meq via ORAL
  Filled 2021-03-08 (×3): qty 1

## 2021-03-08 SURGICAL SUPPLY — 31 items
CHLORAPREP W/TINT 26 (MISCELLANEOUS) ×2 IMPLANT
DRAPE LAPAROTOMY 100X77 ABD (DRAPES) ×2 IMPLANT
ELECT EZSTD 165MM 6.5IN (MISCELLANEOUS) ×2
ELECT REM PT RETURN 9FT ADLT (ELECTROSURGICAL) ×2
ELECTRODE EZSTD 165MM 6.5IN (MISCELLANEOUS) ×1 IMPLANT
ELECTRODE REM PT RTRN 9FT ADLT (ELECTROSURGICAL) ×1 IMPLANT
GAUZE 4X4 16PLY ~~LOC~~+RFID DBL (SPONGE) ×2 IMPLANT
GAUZE SPONGE 4X4 12PLY STRL (GAUZE/BANDAGES/DRESSINGS) ×2 IMPLANT
GLOVE SURG ENC MOIS LTX SZ7 (GLOVE) ×4 IMPLANT
GOWN STRL REUS W/ TWL LRG LVL3 (GOWN DISPOSABLE) ×4 IMPLANT
GOWN STRL REUS W/TWL LRG LVL3 (GOWN DISPOSABLE) ×4
KIT TURNOVER KIT A (KITS) ×2 IMPLANT
LABEL OR SOLS (LABEL) ×2 IMPLANT
LIGASURE IMPACT 36 18CM CVD LR (INSTRUMENTS) ×2 IMPLANT
MANIFOLD NEPTUNE II (INSTRUMENTS) ×2 IMPLANT
NEEDLE HYPO 22GX1.5 SAFETY (NEEDLE) ×2 IMPLANT
NS IRRIG 1000ML POUR BTL (IV SOLUTION) ×2 IMPLANT
PACK BASIN MAJOR ARMC (MISCELLANEOUS) ×2 IMPLANT
PACK COLON CLEAN CLOSURE (MISCELLANEOUS) ×2 IMPLANT
SPONGE T-LAP 18X18 ~~LOC~~+RFID (SPONGE) ×10 IMPLANT
STAPLER CVD CUT BL 40 RELOAD (ENDOMECHANICALS) ×2 IMPLANT
STAPLER SKIN PROX 35W (STAPLE) ×2 IMPLANT
SUT PDS AB 0 CT1 27 (SUTURE) ×6 IMPLANT
SUT SILK 2 0 (SUTURE) ×1
SUT SILK 2 0SH CR/8 30 (SUTURE) ×2 IMPLANT
SUT SILK 2-0 18XBRD TIE 12 (SUTURE) ×1 IMPLANT
SUT VIC AB 3-0 SH 27 (SUTURE) ×1
SUT VIC AB 3-0 SH 27X BRD (SUTURE) ×1 IMPLANT
SYR 20ML LL LF (SYRINGE) ×2 IMPLANT
TRAY FOLEY MTR SLVR 16FR STAT (SET/KITS/TRAYS/PACK) ×2 IMPLANT
WATER STERILE IRR 500ML POUR (IV SOLUTION) ×2 IMPLANT

## 2021-03-08 NOTE — Progress Notes (Addendum)
[facility] SURGICAL PROGRESS NOTE (cpt [id number])  Hospital Day(s): 5.   Interval History: Patient seen and examined, no acute events or new complaints overnight. Patient reports she is having no pain this morning. She denies fever, chills, nausea. She is very hungry this morning and tearful about not eating. She did have a slight leukocytosis this morning to 10.8K. Renal function remains normal. She continues on Zosyn. CX growing E coli and Klebsiella.   Review of Systems:  Constitutional: denies fever, chills  HEENT: denies cough or congestion  Respiratory: denies any shortness of breath  Cardiovascular: denies chest pain or palpitations  Gastrointestinal: denied abdominal pain, denied N/V, or diarrhea Genitourinary: denies burning with urination or urinary frequency  Vital signs in last 24 hours: [min-max] current  Temp:  [97.9 F (36.6 C)-100.2 F (37.9 C)] 97.9 F (36.6 C) (01/25 0542) Pulse Rate:  [80-88] 80 (01/25 0542) Resp:  [16] 16 (01/25 0542) BP: (126-149)/(80-92) 149/87 (01/25 0542) SpO2:  [97 %-100 %] 99 % (01/25 0542)     Height: 5' (152.4 cm) Weight: 44 kg BMI (Calculated): 18.94   Intake/Output last 2 shifts:  01/24 0701 - 01/25 0700 In: 243.1 [IV Piggyback:243.1] Out: -    Physical Exam:  Constitutional: alert, cooperative and no distress  HENT: normocephalic without obvious abnormality  Eyes: PERRL, EOM's grossly intact and symmetric  Respiratory: breathing non-labored at rest  Cardiovascular: regular rate and sinus rhythm  Gastrointestinal: soft, non-tender this morning, difficult to ascertain how tender she is, and non-distended, no rebound/guarding Integumentary: I&D to the LLQ, there is still purulent output  Musculoskeletal: no edema or wounds, motor and sensation grossly intact, NT    Labs:  CBC Latest Ref Rng & Units 03/08/2021 03/07/2021 03/06/2021  WBC 4.0 - 10.5 K/uL 10.3 8.5 10.9(H)  Hemoglobin 12.0 - 15.0 g/dL 9.8(L) 10.0(L) 9.5(L)   Hematocrit 36.0 - 46.0 % 29.6(L) 29.5(L) 28.8(L)  Platelets 150 - 400 K/uL 687(H) 646(H) 591(H)   CMP Latest Ref Rng & Units 03/08/2021 03/07/2021 03/04/2021  Glucose 70 - 99 mg/dL 327(H) 208(H) 90  BUN 8 - 23 mg/dL <5(L) <5(L) 5(L)  Creatinine 0.44 - 1.00 mg/dL 0.56 0.33(L) 0.55  Sodium 135 - 145 mmol/L 134(L) 138 139  Potassium 3.5 - 5.1 mmol/L 2.9(L) 2.7(LL) 3.9  Chloride 98 - 111 mmol/L 99 100 103  CO2 22 - 32 mmol/L 28 29 27   Calcium 8.9 - 10.3 mg/dL 7.9(L) 7.7(L) 8.2(L)  Total Protein 6.5 - 8.1 g/dL 6.5 6.4(L) 6.6  Total Bilirubin 0.3 - 1.2 mg/dL 0.4 0.3 0.4  Alkaline Phos 38 - 126 U/L 89 78 93  AST 15 - 41 U/L 12(L) 13(L) 17  ALT 0 - 44 U/L 9 9 13      Imaging studies: No new pertinent imaging studies   Assessment/Plan: (ICD-10's: K68.92) 61 y.o. female with complex and recurrent diverticulitis with abscess which appears to now be draining from left groin   - I had a long discussion with her this morning regarding the potential for proceeding with Hartmann's. It is still a somewhat unclear picture. This is now her second presentation in a short period of time for the same thing and has failed outpatient management. However, she has now been without fever, abdominal pain, and (until this morning) without leukocytosis for >48 hours. I explained in detail numerous time this morning our rationale for proceeding and not proceeding with surgery. She seemed understanding of these. At this time, she is interested in surgery at some point  in the future but is tearful about not being able to eat breakfast this morning. I explained the importance of NPO status with anesthesia. For now, we will go ahead and hold off on any surgical intervention. She understands that should she clinically deteriorate we will need to proceed. Additionally, I do think that she will ultimately need a sigmoid colectomy either in the urgent/emergent fashion or electively should she do well outpatient.    - Will get  repeat CT Abdomen/Pelvis tomorrow (01/26) morning  - Okay to resume DYS diet  - Continue IV Abx (Zosyn); follow up Cx - Monitor abdominal examination - Pain control prn; antiemetics prn   - Monitor leukocytosis  - Further management per primary service we will follow    All of the above findings and recommendations were discussed with the patient, and the medical team, and all of patient's questions were answered to her expressed satisfaction.  -- Edison Simon, PA-C [facility] 03/08/2021, 7:42 AM [phone] M-F: 7am - 4pm

## 2021-03-08 NOTE — TOC Progression Note (Signed)
Transition of Care [facility]) - Progression Note    Patient Details  Name: Brittany [name] MRN: [id number] Date of Birth: 1960/08/10  Transition of Care [facility]) CM/SW [place], RN Phone Number: 03/08/2021, 4:17 PM  Clinical Narrative:   Surgery Friday toc to follow for post op recommendations    Expected Discharge Plan:  (TBD) Barriers to Discharge: Continued Medical Work up  Expected Discharge Plan and Services Expected Discharge Plan:  (TBD)       Living arrangements for the past 2 months: Single Family Home                                       Social Determinants of Health (SDOH) Interventions    Readmission Risk Interventions Readmission Risk Prevention Plan 03/06/2021 02/16/2021  Transportation Screening Complete Complete  Medication Review Press photographer) Complete Complete  PCP or Specialist appointment within 3-5 days of discharge Complete -  SW Recovery Care/Counseling Consult - Complete  Palliative Care Screening Not Applicable Not [place] Not Applicable Not Applicable  Some recent data might be hidden

## 2021-03-08 NOTE — Progress Notes (Signed)
Inpatient Diabetes Program Recommendations  AACE/ADA: New Consensus Statement on Inpatient Glycemic Control   Target Ranges:  Prepandial:   less than 140 mg/dL      Peak postprandial:   less than 180 mg/dL (1-2 hours)      Critically ill patients:  140 - 180 mg/dL    Latest Reference Range & Units 03/08/21 04:55  Glucose 70 - 99 mg/dL 327 (H)    Latest Reference Range & Units 03/07/21 07:38 03/07/21 11:33 03/07/21 16:08  Glucose-Capillary 70 - 99 mg/dL 190 (H) 326 (H) 209 (H)   Review of Glycemic Control  Diabetes history: DM2 Outpatient Diabetes medications: 70/30 30 units QAM Current orders for Inpatient glycemic control: Novolog 0-6 units TID with meals  Inpatient Diabetes Program Recommendations:    Insulin: Please consider ordering Semglee 4 units daily and Novolog 2 units TID with meals for meal coverage if patient eats at least 50% of meals.  Thanks, Barnie Alderman, RN, MSN, CDE Diabetes Coordinator Inpatient Diabetes Program [phone] (Team Pager from 8am to 5pm)

## 2021-03-08 NOTE — Progress Notes (Signed)
Nutrition Follow-up  DOCUMENTATION CODES:  Severe malnutrition in context of chronic illness  INTERVENTION:  Continue to advance diet as medically able and as tolerated.  Continue Boost Breeze po TID, each supplement provides 250 kcal and 9 grams of protein.  Add double protein with meals - RD to order.  Continue MVI with minerals daily.  Encourage PO and supplement intake.  NUTRITION DIAGNOSIS:  Severe Malnutrition related to chronic illness (COPD) as evidenced by severe fat depletion, severe muscle depletion. - new diagnosis   GOAL:  Patient will meet greater than or equal to 90% of their needs - progressing  MONITOR:  PO intake, Supplement acceptance, Labs, Weight trends, Skin, I & O's  REASON FOR ASSESSMENT:  Malnutrition Screening Tool    ASSESSMENT:  61 y/o female with hx of DM type 2, pancreatic cancer s/p pancreatectomy (80% removed) & splenectomy, RLS, HLD, peripheral neuropathy, GERD, COPD, hx CVA, and substance abuse presented to ED with diarrhea x 3-4 weeks at home after recent admission at [facility] 1/3-1/6. Imaging suggestive of a recurrent colonic diverticular abscess. 1/20 - 87mL drained from left inguinal extraperitoneal abscess 1/25 - D3/thins  Pt very distraught during RD visit. Pt sobbing and asking questions regarding surgery. Pt very anxious and repeating that she was scared.  RD sat with pt and reassured her that she was in good hands. Pt thankful and appreciative.  Pt unable to answer any questions regarding nutrition history.  Per Epic, pt ate 100% of dinner on 1/21. On 1/23, pt ate 75% of breakfast  Supplements: Boost Breeze TID, double proteins with meals  Medications: reviewed; SSI, Semglee, Creon TID, Remeron, Klor-Con 20 mEq TID, Requip BID, Vancomycin PO QID, Zosyn per IV TID  Labs: reviewed; Na 134 (L), K 2.9 (L - repletion in progress), CBG 190-326 (H) HbA1c: 15.3% (11/06/2020)  NUTRITION - FOCUSED PHYSICAL EXAM: Flowsheet Row Most Recent  Value  Orbital Region Moderate depletion  Upper Arm Region Severe depletion  Thoracic and Lumbar Region Severe depletion  Buccal Region Moderate depletion  Temple Region Moderate depletion  Clavicle Bone Region Severe depletion  Clavicle and Acromion Bone Region Severe depletion  Scapular Bone Region Severe depletion  Dorsal Hand Severe depletion  Patellar Region Severe depletion  Anterior Thigh Region Severe depletion  Posterior Calf Region Severe depletion  Edema (RD Assessment) None  Hair Reviewed  Eyes Reviewed  Mouth Reviewed  Skin Reviewed  Nails Reviewed   Diet Order:   Diet Order             Diet clear liquid Room service appropriate? Yes; Fluid consistency: Thin  Diet effective now                  EDUCATION NEEDS:  No education needs have been identified at this time  Skin:  Skin Assessment: Skin Integrity Issues: Skin Integrity Issues:: Incisions Incisions: L lower abdomen, open wound (1/22)  Last BM:  03/08/21 - Type 7, medium  Height:  Ht Readings from Last 1 Encounters:  03/03/21 5' (1.524 m)   Weight:  Wt Readings from Last 1 Encounters:  03/03/21 44 kg   BMI:  Body mass index is 18.94 kg/m.  Estimated Nutritional Needs:  Kcal:  1300-1500 kcal/d Protein:  65-75 g/d Fluid:  1.3-1.5 L/d  Derrel Nip, RD, LDN (she/her/hers) Clinical Inpatient Dietitian RD Pager/After-Hours/Weekend Pager # in [place]

## 2021-03-08 NOTE — Progress Notes (Signed)
[place] at [place] NAME: Brittany [name]    MR#:  [id number]  DATE OF BIRTH:  02/08/1961  SUBJECTIVE:   Pt refused surgery coz she is hungry REVIEW OF SYSTEMS:   Review of Systems  Constitutional:  Negative for chills, fever and weight loss.  HENT:  Negative for ear discharge, ear pain and nosebleeds.   Eyes:  Negative for blurred vision, pain and discharge.  Respiratory:  Negative for sputum production, shortness of breath, wheezing and stridor.   Cardiovascular:  Negative for chest pain, palpitations, orthopnea and PND.  Gastrointestinal:  Negative for abdominal pain, diarrhea, nausea and vomiting.  Genitourinary:  Negative for frequency and urgency.  Musculoskeletal:  Negative for back pain and joint pain.  Neurological:  Positive for weakness. Negative for sensory change, speech change and focal weakness.  Psychiatric/Behavioral:  Negative for depression and hallucinations. The patient is not nervous/anxious.   Tolerating Diet: Tolerating PT:   DRUG ALLERGIES:   Allergies  Allergen Reactions   Cephalosporins Itching   Hydromorphone Itching   Keflin [Cephalothin] Itching   Lactose Intolerance (Gi) Diarrhea    VITALS:  Blood pressure 136/87, pulse 83, temperature 98.3 F (36.8 C), resp. rate 16, height 5' (1.524 m), weight 44 kg, SpO2 96 %.  PHYSICAL EXAMINATION:   Physical Exam  GENERAL:  61 y.o.-year-old patient lying in the bed with no acute distress.thin fraile  HEENT: Head atraumatic, normocephalic. Oropharynx and nasopharynx clear.  LUNGS: Normal breath sounds bilaterally, no wheezing, rales, rhonchi.  CARDIOVASCULAR: S1, S2 normal. No murmurs, rubs, or gallops.  ABDOMEN: Soft, mild diffuse tender, nondistended. Bowel sounds present.  EXTREMITIES: No  edema b/l.    NEUROLOGIC: nonfocal PSYCHIATRIC:  patient is alert and awake SKIN: No obvious rash, lesion, or ulcer.   LABORATORY PANEL:  CBC Recent Labs  Lab  03/08/21 0455  WBC 10.3  HGB 9.8*  HCT 29.6*  PLT 687*    Chemistries  Recent Labs  Lab 03/08/21 0455  NA 134*  K 2.9*  CL 99  CO2 28  GLUCOSE 327*  BUN <5*  CREATININE 0.56  CALCIUM 7.9*  AST 12*  ALT 9  ALKPHOS 89  BILITOT 0.4   Cardiac Enzymes No results for input(s): TROPONINI in the last 168 hours. RADIOLOGY:  No results found. ASSESSMENT AND PLAN:  Brittany [name] is a 61 y.o. 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  C. difficile PCR positive on 02/14/2021 treated with vancomycin. pt presented from home to the ED on 03/02/2021 with LLQ pain associated with N/V and diarrhea x several days  Colonic diverticular abscess, recurrent- s/p superficial abscess aspiration 1/20. I&D 1/22. Cultures showing e coli with sensitivities to zosyn. Patient has cephalosporin allergy. Afebrile about 24 hours. Incision from I&D without erythema or drainage. Iodoform packing in place. repeat abdominal CT 1/22 not showing significant improvement. 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 difficile infection- continues to have frequent loose bms but states that it is improved today. Clostridium difficile infection 02/14/2021. - continue oral vancomycin- complete course 10 days    Hyperglycemia due to type 2 diabetes mellitus ([place])- borderline hypoglycemia while she was NPO but is now having elevated BS with diet added.  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     Restless leg syndrome- chronic, stable on home medications - Continue ropinirole     DVT prophylaxis: SCDs Start: 03/03/21     Procedures: Diverticular abscess drainage per IR Family communication :dter on the phone Consults :surgery, IR CODE STATUS: full DVT Prophylaxis :SCD Level of care: Med-Surg Status is: Inpatient  Remains inpatient appropriate because: diverticular abscess and anticipating patient agrees with surgery        TOTAL TIME TAKING CARE OF THIS PATIENT: 25 minutes.  >50% time spent on counselling and coordination of care  Note: This dictation was prepared with Dragon dictation along with smaller phrase technology. Any transcriptional errors that result from this process are unintentional.  Brittany [name] M.D    [facility]   CC: Primary care physician; Clinic, [facility] Patient ID: Brittany [name], female   DOB: 08/01/1960, 61 y.o.   MRN: [id number]

## 2021-03-09 ENCOUNTER — Inpatient Hospital Stay: Payer: Medicare Other

## 2021-03-09 LAB — GLUCOSE, CAPILLARY
Glucose-Capillary: 132 mg/dL — ABNORMAL HIGH (ref 70–99)
Glucose-Capillary: 217 mg/dL — ABNORMAL HIGH (ref 70–99)
Glucose-Capillary: 136 mg/dL — ABNORMAL HIGH (ref 70–99)
Glucose-Capillary: 73 mg/dL (ref 70–99)

## 2021-03-09 LAB — POTASSIUM: Potassium: 2.9 mmol/L — ABNORMAL LOW (ref 3.5–5.1)

## 2021-03-09 LAB — MAGNESIUM: Magnesium: 1.9 mg/dL (ref 1.7–2.4)

## 2021-03-09 MED ORDER — POTASSIUM CHLORIDE CRYS ER 20 MEQ PO TBCR: 40.0000 meq | EXTENDED_RELEASE_TABLET | Freq: Two times a day (BID) | ORAL | Status: DC

## 2021-03-09 MED ORDER — IOHEXOL 300 MG/ML  SOLN
100.0000 mL | Freq: Once | INTRAMUSCULAR | Status: AC | PRN
  Administered 2021-03-09: 08:00:00 75 mL via INTRAVENOUS

## 2021-03-09 MED ORDER — POTASSIUM CHLORIDE 20 MEQ PO PACK
20.0000 meq | PACK | ORAL | Status: AC
  Administered 2021-03-09 (×4): 20 meq via ORAL
  Filled 2021-03-09 (×4): qty 1

## 2021-03-09 NOTE — OR Nursing (Signed)
Pt MO:QHUTMLY abuse, MDA notified, order for UDS obtained, Floor RN notified

## 2021-03-09 NOTE — Progress Notes (Signed)
[employer] at [place] NAME: Brittany [name]    MR#:  [id number]  DATE OF BIRTH:  1960/06/04  SUBJECTIVE:   Sitting out in the chair. She is in agreement for surgery however wants to eat some eggs. She is currently on clear liquid diet REVIEW OF SYSTEMS:   Review of Systems  Constitutional:  Negative for chills, fever and weight loss.  HENT:  Negative for ear discharge, ear pain and nosebleeds.   Eyes:  Negative for blurred vision, pain and discharge.  Respiratory:  Negative for sputum production, shortness of breath, wheezing and stridor.   Cardiovascular:  Negative for chest pain, palpitations, orthopnea and PND.  Gastrointestinal:  Negative for abdominal pain, diarrhea, nausea and vomiting.  Genitourinary:  Negative for frequency and urgency.  Musculoskeletal:  Negative for back pain and joint pain.  Neurological:  Positive for weakness. Negative for sensory change, speech change and focal weakness.  Psychiatric/Behavioral:  Negative for depression and hallucinations. The patient is not nervous/anxious.   Tolerating Diet:CLD Tolerating PT: yes  DRUG ALLERGIES:   Allergies  Allergen Reactions   Cephalosporins Itching   Hydromorphone Itching   Keflin [Cephalothin] Itching   Lactose Intolerance (Gi) Diarrhea    VITALS:  Blood pressure (!) 145/100, pulse 72, temperature 98.3 F (36.8 C), temperature source Oral, resp. rate 16, height 5' (1.524 m), weight 44 kg, SpO2 99 %.  PHYSICAL EXAMINATION:   Physical Exam  GENERAL:  61 y.o.-year-old patient lying in the bed with no acute distress.thin fraile  HEENT: Head atraumatic, normocephalic. Oropharynx and nasopharynx clear.  LUNGS: Normal breath sounds bilaterally, no wheezing, rales, rhonchi.  CARDIOVASCULAR: S1, S2 normal. No murmurs, rubs, or gallops.  ABDOMEN: Soft, mild diffuse tender, nondistended. Bowel sounds present.  EXTREMITIES: No  edema b/l.    NEUROLOGIC:  nonfocal PSYCHIATRIC:  patient is alert and awake SKIN: No obvious rash, lesion, or ulcer.   LABORATORY PANEL:  CBC Recent Labs  Lab 03/08/21 0455  WBC 10.3  HGB 9.8*  HCT 29.6*  PLT 687*     Chemistries  Recent Labs  Lab 03/08/21 0455 03/09/21 0424  NA 134*  --   K 2.9* 2.9*  CL 99  --   CO2 28  --   GLUCOSE 327*  --   BUN <5*  --   CREATININE 0.56  --   CALCIUM 7.9*  --   MG 1.6* 1.9  AST 12*  --   ALT 9  --   ALKPHOS 89  --   BILITOT 0.4  --     Cardiac Enzymes No results for input(s): TROPONINI in the last 168 hours. RADIOLOGY:  CT ABDOMEN PELVIS W CONTRAST  Result Date: 03/09/2021 CLINICAL DATA:  Abdominal pain. Reassessment diverticulitis with abscess formation. EXAM: CT ABDOMEN AND PELVIS WITH CONTRAST TECHNIQUE: Multidetector CT imaging of the abdomen and pelvis was performed using the standard protocol following bolus administration of intravenous contrast. RADIATION DOSE REDUCTION: This exam was performed according to the departmental dose-optimization program which includes automated exposure control, adjustment of the mA and/or kV according to patient size and/or use of iterative reconstruction technique. CONTRAST:  66mL OMNIPAQUE IOHEXOL 300 MG/ML  SOLN COMPARISON:  CT abdomen and pelvis 03/05/2021 FINDINGS: Lower chest: Respiratory motion and mild atelectasis in the lung bases. No pleural effusion. Hepatobiliary: No focal liver abnormality is seen. No gallstones, gallbladder wall thickening, or biliary dilatation. Pancreas: Status post distal pancreatectomy. Unchanged multiloculated cystic mass involving the pancreatic head/uncinate  process with maximal diameter 4.6 cm on axial images. Spleen: Status post splenectomy. Adrenals/Urinary Tract: Unremarkable right adrenal gland. Unchanged 1.2 cm left adrenal nodule, more fully characterized on recent MRI. No evidence of a renal mass, calculi, or hydronephrosis. Unremarkable bladder. Stomach/Bowel: The stomach is  unremarkable. There is no evidence of bowel obstruction. The inflamed sigmoid colon with associated intramural abscess formation has not significantly changed from the prior CT. There is increased wall thickening versus underdistention of the more distal sigmoid colon and rectum. An abscess lateral to the inflamed proximal sigmoid colon at the anterior aspect of the left iliac fossa has mildly decreased in size, now measuring 2.8 x 1.4 cm (series 5, image 14, previously 4.5 x 2.3 cm), in this abscess is again noted to communicate with an additional abscess more superficially in the left inguinal region which has also mildly decreased in size, measuring 4.3 x 3.5 cm (series 5, image 17, previously 5.3 x 3.8 cm). No new abscess is identified. Vascular/Lymphatic: Abdominal aortic atherosclerosis without aneurysm. No enlarged lymph nodes. Reproductive: Grossly unremarkable uterus.  Tubal ligation clips. Other: No significant ascites. No pneumoperitoneum. Musculoskeletal: No acute osseous abnormality or suspicious osseous lesion. Advanced lower lumbar facet arthrosis with grade 1 anterolisthesis of L3 on L4. IMPRESSION: 1. Unchanged appearance of the inflamed proximal sigmoid colon consistent with diverticulitis with intramural abscess formation. Increased wall thickening versus underdistention of the more distal sigmoid colon and rectum. 2. Mildly decreased size of 2 abscesses extending to the left inguinal region. 3. Unchanged multiloculated cystic pancreatic mass. 4. Aortic Atherosclerosis (ICD10-I70.0). Electronically Signed   By: Logan Bores M.D.   On: 03/09/2021 10:33   ASSESSMENT AND PLAN:  Brittany [name] is a 61 y.o. 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  C. difficile PCR positive on 02/14/2021 treated with vancomycin. pt presented from home to the ED on 03/02/2021 with LLQ pain associated with N/V and diarrhea x several days  Colonic diverticular abscess, recurrent- s/p superficial abscess aspiration 1/20. I&D 1/22. Cultures showing e coli with sensitivities to zosyn. Patient has cephalosporin allergy. Afebrile about 24 hours. Incision from I&D without erythema or drainage. Iodoform packing in place. repeat abdominal CT 1/22 not showing significant improvement. - general surgery following--pt did not want surgery today -IV Zosyn, pain control, IV antiemetics -- repeat CT scan 1/26-- shows no improvement in diverticular disease and continues to have intramural abscess. -- Surgery has spoken with patient and her daughter. 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 difficile infection- continues to have frequent loose bms but states that it is improved today. Clostridium difficile infection 02/14/2021. - continue oral vancomycin- complete course 10 days   Hyperglycemia due to type 2 diabetes mellitus ([place])- borderline hypoglycemia while she was NPO but is now having elevated BS with diet added.  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     Restless leg syndrome- chronic, stable on home medications - Continue ropinirole     DVT prophylaxis: SCDs Start: 03/03/21     Procedures: Diverticular abscess drainage per  IR Family communication :dter on the phone 1/25 Consults :surgery, IR CODE STATUS: full DVT Prophylaxis :SCD Level of care: Med-Surg Status is: Inpatient  Remains inpatient appropriate because: diverticular abscess and anticipating patient agrees with surgery        TOTAL TIME TAKING CARE OF THIS PATIENT: 25 minutes.  >50% time spent on counselling and coordination of care  Note: This dictation was prepared with Dragon dictation along with smaller phrase technology. Any transcriptional errors that result from this process are unintentional.  Fritzi Mandes M.D    [facility]   CC: Primary care physician; Clinic, [facility] Patient ID: Brittany [name], female   DOB: 1960/09/20, 61 y.o.   MRN: [id number]

## 2021-03-09 NOTE — Progress Notes (Addendum)
[facility] SURGICAL PROGRESS NOTE (cpt [phone])  Hospital Day(s): 6.   Interval History: Patient seen and examined, no acute events or new complaints overnight. Patient reports she is doing well mostly tearful because she wants "scrambled eggs" this morning. She is not complaining of abdominal pain. No fever, chills, nausea, emesis. She is on CLD. She continues on Zosyn. CX growing E coli and Klebsiella. Plan for Hartmann's tomorrow.   Review of Systems:  Constitutional: denies fever, chills  HEENT: denies cough or congestion  Respiratory: denies any shortness of breath  Cardiovascular: denies chest pain or palpitations  Gastrointestinal: denied abdominal pain, denied N/V, or diarrhea Genitourinary: denies burning with urination or urinary frequency  Vital signs in last 24 hours: [min-max] current  Temp:  [97.5 F (36.4 C)-98.9 F (37.2 C)] 98.3 F (36.8 C) (01/26 0739) Pulse Rate:  [70-83] 70 (01/26 0739) Resp:  [15-18] 16 (01/26 0739) BP: (125-160)/(78-105) 151/92 (01/26 0739) SpO2:  [94 %-100 %] 99 % (01/26 0739)     Height: 5' (152.4 cm) Weight: 44 kg BMI (Calculated): 18.94   Intake/Output last 2 shifts:  01/25 0701 - 01/26 0700 In: 156.9 [IV Piggyback:156.9] Out: -    Physical Exam:  Constitutional: alert, cooperative and no distress  HENT: normocephalic without obvious abnormality  Eyes: PERRL, EOM's grossly intact and symmetric  Respiratory: breathing non-labored at rest  Cardiovascular: regular rate and sinus rhythm  Gastrointestinal: soft, non-tender this morning, difficult to ascertain how tender she is, and non-distended, no rebound/guarding Integumentary: I&D to the LLQ, there is still purulent output  Musculoskeletal: no edema or wounds, motor and sensation grossly intact, NT    Labs:  CBC Latest Ref Rng & Units 03/08/2021 03/07/2021 03/06/2021  WBC 4.0 - 10.5 K/uL 10.3 8.5 10.9(H)  Hemoglobin 12.0 - 15.0 g/dL 9.8(L) 10.0(L) 9.5(L)  Hematocrit  36.0 - 46.0 % 29.6(L) 29.5(L) 28.8(L)  Platelets 150 - 400 K/uL 687(H) 646(H) 591(H)   CMP Latest Ref Rng & Units 03/09/2021 03/08/2021 03/07/2021  Glucose 70 - 99 mg/dL - 327(H) 208(H)  BUN 8 - 23 mg/dL - <5(L) <5(L)  Creatinine 0.44 - 1.00 mg/dL - 0.56 0.33(L)  Sodium 135 - 145 mmol/L - 134(L) 138  Potassium 3.5 - 5.1 mmol/L 2.9(L) 2.9(L) 2.7(LL)  Chloride 98 - 111 mmol/L - 99 100  CO2 22 - 32 mmol/L - 28 29  Calcium 8.9 - 10.3 mg/dL - 7.9(L) 7.7(L)  Total Protein 6.5 - 8.1 g/dL - 6.5 6.4(L)  Total Bilirubin 0.3 - 1.2 mg/dL - 0.4 0.3  Alkaline Phos 38 - 126 U/L - 89 78  AST 15 - 41 U/L - 12(L) 13(L)  ALT 0 - 44 U/L - 9 9     Imaging studies:   CT Abdomen/Pelvis (03/09/2021) personally reviewed, limited secondary to lack of IV contrast, still with inflammatory changes to the sigmoid/distal colon, abscess which appears to have fistulized to the LLQ, unchanged, and radiologist report pending:   Assessment/Plan: (ICD-10's: K44.92) 61 y.o. 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 Study is limited by lack of contrast, but regardless, I do not feel there is any significant improvement. As such, I do think we should proceed tomorrow (01/27) for Hartman's procedure. Patient is in agreement.   - All risks, benefits, and alternatives to above procedure(s) were  discussed with the patient, all of her questions were answered to her expressed satisfaction, patient expresses she wishes to proceed, and informed consent was obtained.    - NPO at midnight  - Continue local wound care: Pack LLQ wound, cover, secure. This needs to be done daily  - Continue IV Abx (Zosyn); I&D Cx growing klebsiella; sensitivities reviewed  - Monitor abdominal examination - Pain control prn; antiemetics prn   - Monitor  leukocytosis; pending   - Further management per primary service we will follow    All of the above findings and recommendations were discussed with the patient, and the medical team, and all of patient's questions were answered to her expressed satisfaction.  -- Edison Simon, PA-C [facility] 03/09/2021, 9:33 AM [phone] M-F: 7am - 4pm

## 2021-03-09 NOTE — Consult Note (Addendum)
PHARMACY CONSULT NOTE - FOLLOW UP  Pharmacy Consult for Electrolyte Monitoring and Replacement   Recent Labs: Potassium (mmol/L)  Date Value  03/09/2021 2.9 (L)  08/17/2011 3.6   Magnesium (mg/dL)  Date Value  03/09/2021 1.9   Calcium (mg/dL)  Date Value  03/08/2021 7.9 (L)   Calcium, Total (mg/dL)  Date Value  08/17/2011 9.7   Albumin (g/dL)  Date Value  03/08/2021 1.9 (L)  04/04/2011 4.5   Phosphorus (mg/dL)  Date Value  11/19/2020 2.7   Sodium (mmol/L)  Date Value  03/08/2021 134 (L)  08/17/2011 139    Assessment: Patient admitted for colonic diverticular abscess. Abx and aspiration completed. Surgical intervention not planned fr now. Patient on liquid dit and oral supplement. Noted multifactorial diarrhea and low K. Pharmacy consulted for electrolytes replacement.  Goal of Therapy:  Electrolytes within normal range  Plan:  Mg today within normal limit after replacement with 2gm IVPB yesterday.Corrected calcium also WNL. Sodium slightly below goal but not additional intervention required at this time. K remains at 2.9 after receiving 3 doses of Kcl 20mg  tablets on 1/25. Will replace with Kcl 54meq pack q 2 hrs ax 3 doses today and repeat potassium level tomorrow with AM labs.  Elysia Grand Rodriguez-Guzman PharmD, BCPS 03/09/2021 8:54 AM  *Change order to Kcl 25meq powder q2 hrs x 4 doses per Dr Corlis Leak request Tushar Enns Rodriguez-Guzman PharmD, BCPS 03/09/2021 11:59 AM

## 2021-03-10 ENCOUNTER — Inpatient Hospital Stay: Payer: Medicare Other | Admitting: Anesthesiology

## 2021-03-10 ENCOUNTER — Encounter: Payer: Self-pay | Admitting: Internal Medicine

## 2021-03-10 ENCOUNTER — Encounter
Admission: EM | Disposition: A | Discharge status: Home Health | Payer: Self-pay | Source: Home / Self Care | Attending: Internal Medicine

## 2021-03-10 ENCOUNTER — Other Ambulatory Visit: Payer: Self-pay

## 2021-03-10 HISTORY — PX: COLECTOMY WITH COLOSTOMY CREATION/HARTMANN PROCEDURE: SHX6598

## 2021-03-10 LAB — GLUCOSE, CAPILLARY
Glucose-Capillary: 66 mg/dL — ABNORMAL LOW (ref 70–99)
Glucose-Capillary: 148 mg/dL — ABNORMAL HIGH (ref 70–99)
Glucose-Capillary: 106 mg/dL — ABNORMAL HIGH (ref 70–99)
Glucose-Capillary: 185 mg/dL — ABNORMAL HIGH (ref 70–99)
Glucose-Capillary: 208 mg/dL — ABNORMAL HIGH (ref 70–99)
Glucose-Capillary: 133 mg/dL — ABNORMAL HIGH (ref 70–99)
Glucose-Capillary: 272 mg/dL — ABNORMAL HIGH (ref 70–99)

## 2021-03-10 LAB — CBC
HCT: 29.3 % — ABNORMAL LOW (ref 36.0–46.0)
Hemoglobin: 9.6 g/dL — ABNORMAL LOW (ref 12.0–15.0)
MCH: 30.1 pg (ref 26.0–34.0)
MCHC: 32.8 g/dL (ref 30.0–36.0)
MCV: 91.8 fL (ref 80.0–100.0)
Platelets: 731 10*3/uL — ABNORMAL HIGH (ref 150–400)
RBC: 3.19 MIL/uL — ABNORMAL LOW (ref 3.87–5.11)
RDW: 13.7 % (ref 11.5–15.5)
WBC: 6.2 10*3/uL (ref 4.0–10.5)
nRBC: 0 % (ref 0.0–0.2)

## 2021-03-10 LAB — URINE DRUG SCREEN, QUALITATIVE (ARMC ONLY)
Amphetamines, Ur Screen: NOT DETECTED
Barbiturates, Ur Screen: NOT DETECTED
Benzodiazepine, Ur Scrn: NOT DETECTED
Cannabinoid 50 Ng, Ur ~~LOC~~: NOT DETECTED
Cocaine Metabolite,Ur ~~LOC~~: NOT DETECTED
MDMA (Ecstasy)Ur Screen: NOT DETECTED
Methadone Scn, Ur: NOT DETECTED
Opiate, Ur Screen: POSITIVE — AB
Phencyclidine (PCP) Ur S: NOT DETECTED
Tricyclic, Ur Screen: NOT DETECTED

## 2021-03-10 LAB — MAGNESIUM: Magnesium: 1.7 mg/dL (ref 1.7–2.4)

## 2021-03-10 LAB — POTASSIUM: Potassium: 3.1 mmol/L — ABNORMAL LOW (ref 3.5–5.1)

## 2021-03-10 LAB — PHOSPHORUS: Phosphorus: 3.8 mg/dL (ref 2.5–4.6)

## 2021-03-10 SURGERY — COLECTOMY, WITH COLOSTOMY CREATION
Anesthesia: General

## 2021-03-10 MED ORDER — EPHEDRINE SULFATE (PRESSORS) 50 MG/ML IJ SOLN
INTRAMUSCULAR | Status: DC | PRN
  Administered 2021-03-10 (×2): 5 mg via INTRAVENOUS

## 2021-03-10 MED ORDER — ENOXAPARIN SODIUM 40 MG/0.4ML IJ SOSY: 40.0000 mg | PREFILLED_SYRINGE | INTRAMUSCULAR | Status: DC

## 2021-03-10 MED ORDER — FENTANYL CITRATE (PF) 250 MCG/5ML IJ SOLN
INTRAMUSCULAR | Status: AC
  Filled 2021-03-10: qty 5

## 2021-03-10 MED ORDER — BUPIVACAINE-EPINEPHRINE (PF) 0.25% -1:200000 IJ SOLN
INTRAMUSCULAR | Status: AC
  Filled 2021-03-10: qty 30

## 2021-03-10 MED ORDER — ONDANSETRON HCL 4 MG/2ML IJ SOLN
INTRAMUSCULAR | Status: DC | PRN
  Administered 2021-03-10: 4 mg via INTRAVENOUS

## 2021-03-10 MED ORDER — BUPIVACAINE LIPOSOME 1.3 % IJ SUSP
INTRAMUSCULAR | Status: AC
  Filled 2021-03-10: qty 20

## 2021-03-10 MED ORDER — ROCURONIUM BROMIDE 10 MG/ML (PF) SYRINGE
PREFILLED_SYRINGE | INTRAVENOUS | Status: AC
  Filled 2021-03-10: qty 10

## 2021-03-10 MED ORDER — BUPIVACAINE-EPINEPHRINE 0.25% -1:200000 IJ SOLN
INTRAMUSCULAR | Status: DC | PRN
  Administered 2021-03-10: 30 mL

## 2021-03-10 MED ORDER — SODIUM CHLORIDE (PF) 0.9 % IJ SOLN
INTRAMUSCULAR | Status: AC
  Filled 2021-03-10: qty 50

## 2021-03-10 MED ORDER — POTASSIUM CHLORIDE 10 MEQ/100ML IV SOLN
10.0000 meq | INTRAVENOUS | Status: DC
  Administered 2021-03-10: 15:00:00 10 meq via INTRAVENOUS
  Filled 2021-03-10: qty 100

## 2021-03-10 MED ORDER — SUCCINYLCHOLINE CHLORIDE 200 MG/10ML IV SOSY
PREFILLED_SYRINGE | INTRAVENOUS | Status: AC
  Filled 2021-03-10: qty 10

## 2021-03-10 MED ORDER — KETAMINE HCL 50 MG/5ML IJ SOSY
PREFILLED_SYRINGE | INTRAMUSCULAR | Status: AC
  Filled 2021-03-10: qty 5

## 2021-03-10 MED ORDER — PHENYLEPHRINE HCL-NACL 20-0.9 MG/250ML-% IV SOLN
INTRAVENOUS | Status: DC | PRN
  Administered 2021-03-10: 35 ug/min via INTRAVENOUS

## 2021-03-10 MED ORDER — FENTANYL CITRATE (PF) 100 MCG/2ML IJ SOLN
INTRAMUSCULAR | Status: AC
  Administered 2021-03-10: 25 ug via INTRAVENOUS
  Filled 2021-03-10: qty 2

## 2021-03-10 MED ORDER — LIDOCAINE HCL (PF) 2 % IJ SOLN
INTRAMUSCULAR | Status: AC
  Filled 2021-03-10: qty 5

## 2021-03-10 MED ORDER — KETOROLAC TROMETHAMINE 30 MG/ML IJ SOLN
INTRAMUSCULAR | Status: DC | PRN
  Administered 2021-03-10: 15 mg via INTRAVENOUS

## 2021-03-10 MED ORDER — 0.9 % SODIUM CHLORIDE (POUR BTL) OPTIME
TOPICAL | Status: DC | PRN
  Administered 2021-03-10: 1500 mL

## 2021-03-10 MED ORDER — DEXAMETHASONE SODIUM PHOSPHATE 10 MG/ML IJ SOLN
INTRAMUSCULAR | Status: AC
  Filled 2021-03-10: qty 1

## 2021-03-10 MED ORDER — SUGAMMADEX SODIUM 200 MG/2ML IV SOLN
INTRAVENOUS | Status: DC | PRN
  Administered 2021-03-10: 88 mg via INTRAVENOUS

## 2021-03-10 MED ORDER — PHENYLEPHRINE HCL (PRESSORS) 10 MG/ML IV SOLN
INTRAVENOUS | Status: DC | PRN
  Administered 2021-03-10 (×2): 80 ug via INTRAVENOUS

## 2021-03-10 MED ORDER — ACETAMINOPHEN 500 MG PO TABS
1000.0000 mg | ORAL_TABLET | Freq: Four times a day (QID) | ORAL | Status: DC
  Administered 2021-03-10 – 2021-03-14 (×15): 1000 mg via ORAL
  Filled 2021-03-10 (×16): qty 2

## 2021-03-10 MED ORDER — OXYCODONE HCL 5 MG PO TABS: 5.0000 mg | ORAL_TABLET | Freq: Once | ORAL | Status: DC | PRN

## 2021-03-10 MED ORDER — MIDAZOLAM HCL 2 MG/2ML IJ SOLN
INTRAMUSCULAR | Status: DC | PRN
Start: 2021-03-10 — End: 2021-03-10
  Administered 2021-03-10: .5 mg via INTRAVENOUS

## 2021-03-10 MED ORDER — MIDAZOLAM HCL 2 MG/2ML IJ SOLN
INTRAMUSCULAR | Status: AC
  Filled 2021-03-10: qty 2

## 2021-03-10 MED ORDER — KETAMINE HCL 10 MG/ML IJ SOLN
INTRAMUSCULAR | Status: DC | PRN
  Administered 2021-03-10 (×2): 10 mg via INTRAVENOUS

## 2021-03-10 MED ORDER — DEXTROSE 50 % IV SOLN
INTRAVENOUS | Status: AC
  Administered 2021-03-10: 25 mL
  Filled 2021-03-10: qty 50

## 2021-03-10 MED ORDER — FENTANYL CITRATE (PF) 100 MCG/2ML IJ SOLN
INTRAMUSCULAR | Status: DC | PRN
  Administered 2021-03-10: 100 ug via INTRAVENOUS
  Administered 2021-03-10 (×2): 50 ug via INTRAVENOUS

## 2021-03-10 MED ORDER — SODIUM CHLORIDE 0.9 % IV SOLN
INTRAVENOUS | Status: DC | PRN
  Administered 2021-03-10: 70 mL

## 2021-03-10 MED ORDER — KETOROLAC TROMETHAMINE 15 MG/ML IJ SOLN
15.0000 mg | Freq: Four times a day (QID) | INTRAMUSCULAR | Status: DC
  Administered 2021-03-10 – 2021-03-12 (×9): 15 mg via INTRAVENOUS
  Filled 2021-03-10 (×10): qty 1

## 2021-03-10 MED ORDER — OXYCODONE HCL 5 MG/5ML PO SOLN: 5.0000 mg | Freq: Once | ORAL | Status: DC | PRN

## 2021-03-10 MED ORDER — PHENYLEPHRINE HCL-NACL 20-0.9 MG/250ML-% IV SOLN
INTRAVENOUS | Status: AC
  Filled 2021-03-10: qty 250

## 2021-03-10 MED ORDER — ONDANSETRON HCL 4 MG/2ML IJ SOLN
INTRAMUSCULAR | Status: AC
  Filled 2021-03-10: qty 2

## 2021-03-10 MED ORDER — GABAPENTIN 600 MG PO TABS
300.0000 mg | ORAL_TABLET | Freq: Three times a day (TID) | ORAL | Status: DC
  Administered 2021-03-10 – 2021-03-14 (×12): 300 mg via ORAL
  Filled 2021-03-10 (×12): qty 1

## 2021-03-10 MED ORDER — ROCURONIUM BROMIDE 100 MG/10ML IV SOLN
INTRAVENOUS | Status: DC | PRN
Start: 2021-03-10 — End: 2021-03-10
  Administered 2021-03-10: 40 mg via INTRAVENOUS
  Administered 2021-03-10: 60 mg via INTRAVENOUS

## 2021-03-10 MED ORDER — ONDANSETRON HCL 4 MG/2ML IJ SOLN: 4.0000 mg | Freq: Once | INTRAMUSCULAR | Status: DC | PRN

## 2021-03-10 MED ORDER — POTASSIUM CHLORIDE 20 MEQ PO PACK
40.0000 meq | PACK | Freq: Once | ORAL | Status: AC
  Administered 2021-03-10: 40 meq via ORAL
  Filled 2021-03-10: qty 2

## 2021-03-10 MED ORDER — GLYCOPYRROLATE 0.2 MG/ML IJ SOLN
INTRAMUSCULAR | Status: AC
  Filled 2021-03-10: qty 1

## 2021-03-10 MED ORDER — PROPOFOL 10 MG/ML IV BOLUS
INTRAVENOUS | Status: DC | PRN
  Administered 2021-03-10: 80 mg via INTRAVENOUS

## 2021-03-10 MED ORDER — PROPOFOL 10 MG/ML IV BOLUS
INTRAVENOUS | Status: AC
  Filled 2021-03-10: qty 40

## 2021-03-10 MED ORDER — GLYCOPYRROLATE 0.2 MG/ML IJ SOLN
INTRAMUSCULAR | Status: DC | PRN
  Administered 2021-03-10: .2 mg via INTRAVENOUS

## 2021-03-10 MED ORDER — CHLORHEXIDINE GLUCONATE CLOTH 2 % EX PADS
6.0000 | MEDICATED_PAD | Freq: Every day | CUTANEOUS | Status: DC
  Administered 2021-03-10 – 2021-03-14 (×5): 6 via TOPICAL

## 2021-03-10 MED ORDER — KETOROLAC TROMETHAMINE 30 MG/ML IJ SOLN
INTRAMUSCULAR | Status: AC
  Filled 2021-03-10: qty 1

## 2021-03-10 MED ORDER — LACTATED RINGERS IV SOLN: INTRAVENOUS | Status: DC | PRN

## 2021-03-10 MED ORDER — EPHEDRINE 5 MG/ML INJ
INTRAVENOUS | Status: AC
  Filled 2021-03-10: qty 5

## 2021-03-10 MED ORDER — FENTANYL CITRATE (PF) 100 MCG/2ML IJ SOLN
25.0000 ug | INTRAMUSCULAR | Status: DC | PRN
  Administered 2021-03-10: 25 ug via INTRAVENOUS

## 2021-03-10 MED ORDER — DEXAMETHASONE SODIUM PHOSPHATE 10 MG/ML IJ SOLN
INTRAMUSCULAR | Status: DC | PRN
  Administered 2021-03-10: 4 mg via INTRAVENOUS

## 2021-03-10 MED ORDER — APREPITANT 40 MG PO CAPS
ORAL_CAPSULE | ORAL | Status: AC
  Administered 2021-03-10: 40 mg
  Filled 2021-03-10: qty 1

## 2021-03-10 MED ORDER — ENOXAPARIN SODIUM 30 MG/0.3ML IJ SOSY
30.0000 mg | PREFILLED_SYRINGE | INTRAMUSCULAR | Status: DC
  Administered 2021-03-11 – 2021-03-13 (×3): 30 mg via SUBCUTANEOUS
  Filled 2021-03-10 (×3): qty 0.3

## 2021-03-10 MED ORDER — LIDOCAINE HCL (CARDIAC) PF 100 MG/5ML IV SOSY
PREFILLED_SYRINGE | INTRAVENOUS | Status: DC | PRN
  Administered 2021-03-10: 50 mg via INTRAVENOUS

## 2021-03-10 SURGICAL SUPPLY — 46 items
ADHESIVE MASTISOL STRL (MISCELLANEOUS) ×2 IMPLANT
BARRIER ADH SEPRAFILM 3INX5IN (MISCELLANEOUS) ×3 IMPLANT
BNDG GAUZE ELAST 4 BULKY (GAUZE/BANDAGES/DRESSINGS) ×1 IMPLANT
BULB RESERV EVAC DRAIN JP 100C (MISCELLANEOUS) ×1 IMPLANT
CHLORAPREP W/TINT 26 (MISCELLANEOUS) ×1 IMPLANT
DRAIN CHANNEL JP 19F (MISCELLANEOUS) ×1 IMPLANT
DRAPE LAPAROTOMY 100X77 ABD (DRAPES) ×2 IMPLANT
ELECT CAUTERY BLADE 6.4 (BLADE) ×1 IMPLANT
ELECT EZSTD 165MM 6.5IN (MISCELLANEOUS) ×2
ELECT REM PT RETURN 9FT ADLT (ELECTROSURGICAL) ×2
ELECTRODE EZSTD 165MM 6.5IN (MISCELLANEOUS) ×1 IMPLANT
ELECTRODE REM PT RTRN 9FT ADLT (ELECTROSURGICAL) ×1 IMPLANT
GAUZE 4X4 16PLY ~~LOC~~+RFID DBL (SPONGE) ×1 IMPLANT
GAUZE SPONGE 4X4 12PLY STRL (GAUZE/BANDAGES/DRESSINGS) ×2 IMPLANT
GLOVE SURG ENC MOIS LTX SZ7 (GLOVE) ×18 IMPLANT
GOWN STRL REUS W/ TWL LRG LVL3 (GOWN DISPOSABLE) ×4 IMPLANT
GOWN STRL REUS W/TWL LRG LVL3 (GOWN DISPOSABLE) ×8
HANDLE YANKAUER SUCT BULB TIP (MISCELLANEOUS) ×3 IMPLANT
KIT OSTOMY DRAINABLE 2.75 STR (WOUND CARE) ×1 IMPLANT
KIT TURNOVER KIT A (KITS) ×2 IMPLANT
LABEL OR SOLS (LABEL) ×2 IMPLANT
LIGASURE IMPACT 36 18CM CVD LR (INSTRUMENTS) ×2 IMPLANT
MANIFOLD NEPTUNE II (INSTRUMENTS) ×2 IMPLANT
NEEDLE HYPO 22GX1.5 SAFETY (NEEDLE) ×2 IMPLANT
NS IRRIG 1000ML POUR BTL (IV SOLUTION) ×2 IMPLANT
PACK BASIN MAJOR ARMC (MISCELLANEOUS) ×2 IMPLANT
PACK COLON CLEAN CLOSURE (MISCELLANEOUS) ×1 IMPLANT
RELOAD PROXIMATE 75MM BLUE (ENDOMECHANICALS) ×4 IMPLANT
RELOAD STAPLE 75 3.8 BLU REG (ENDOMECHANICALS) IMPLANT
SPONGE T-LAP 18X18 ~~LOC~~+RFID (SPONGE) ×10 IMPLANT
STAPLER CVD CUT BL 40 RELOAD (ENDOMECHANICALS) IMPLANT
STAPLER CVD CUT BLU 40 RELOAD (ENDOMECHANICALS) ×1 IMPLANT
STAPLER PROXIMATE 75MM BLUE (STAPLE) ×1 IMPLANT
STAPLER SKIN PROX 35W (STAPLE) ×2 IMPLANT
SUT ETHILON 3-0 FS-10 30 BLK (SUTURE) ×2
SUT PDS AB 0 CT1 27 (SUTURE) ×6 IMPLANT
SUT SILK 2 0 (SUTURE) ×1
SUT SILK 2 0SH CR/8 30 (SUTURE) ×2 IMPLANT
SUT SILK 2-0 18XBRD TIE 12 (SUTURE) ×1 IMPLANT
SUT VIC AB 3-0 SH 27 (SUTURE) ×7
SUT VIC AB 3-0 SH 27X BRD (SUTURE) ×1 IMPLANT
SUTURE EHLN 3-0 FS-10 30 BLK (SUTURE) IMPLANT
SYR 20ML LL LF (SYRINGE) ×2 IMPLANT
TRAY FOLEY MTR SLVR 16FR STAT (SET/KITS/TRAYS/PACK) ×2 IMPLANT
TUBING CONNECTING 10 (TUBING) ×1 IMPLANT
WATER STERILE IRR 500ML POUR (IV SOLUTION) ×2 IMPLANT

## 2021-03-10 NOTE — Anesthesia Procedure Notes (Signed)
Procedure Name: Intubation Date/Time: 03/10/2021 7:50 AM Performed by: Demetrius Charity, CRNA Pre-anesthesia Checklist: Patient identified, Patient being monitored, Timeout performed, Emergency Drugs available and Suction available Patient Re-evaluated:Patient Re-evaluated prior to induction Oxygen Delivery Method: Circle system utilized Preoxygenation: Pre-oxygenation with 100% oxygen Induction Type: IV induction Ventilation: Mask ventilation without difficulty Laryngoscope Size: 3 and McGraph Grade View: Grade I Tube type: Oral Tube size: 6.5 mm Number of attempts: 1 Airway Equipment and Method: Stylet and Video-laryngoscopy Placement Confirmation: ETT inserted through vocal cords under direct vision, positive ETCO2 and breath sounds checked- equal and bilateral Secured at: 20 cm Tube secured with: Tape Dental Injury: Teeth and Oropharynx as per pre-operative assessment

## 2021-03-10 NOTE — Anesthesia Preprocedure Evaluation (Addendum)
Anesthesia Evaluation  Patient identified by MRN, date of birth, ID band Patient awake  General Assessment Comment:  61 y.o. female with complex and recurrent diverticulitis with abscess which appears to now be draining from left groin. 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 C. difficile PCR positive on 02/14/2021 treated with vancomycin.   Reviewed: Allergy & Precautions, NPO status , Patient's Chart, lab work & pertinent test results  History of Anesthesia Complications Negative for: history of anesthetic complications  Airway Mallampati: II  TM Distance: >3 FB Neck ROM: Full    Dental  (+) Poor Dentition, Chipped   Pulmonary neg sleep apnea, COPD, Current Smoker and Patient abstained from smoking.,    Pulmonary exam normal breath sounds clear to auscultation       Cardiovascular Exercise Tolerance: Good METShypertension, (-) CAD and (-) Past MI (-) dysrhythmias  Rhythm:Regular Rate:Normal - Systolic murmurs    Neuro/Psych PSYCHIATRIC DISORDERS Anxiety Depression Residual right foot weakness. Patient says she didn't know she had a stroke, was told she had one when she arrived for diarrhea a month ago CVA, Residual Symptoms    GI/Hepatic neg GERD  ,(+)     substance abuse  cocaine use and marijuana use,   Endo/Other  diabetes  Renal/GU negative Renal ROS     Musculoskeletal   Abdominal   Peds  Hematology   Anesthesia Other Findings Past Medical History: No date: Cancer ([place]) No date: Collagen vascular disease ([place]) No date: Diabetes mellitus without complication (HCC) No date:  Hypertension No date: Lupus ([place])  Reproductive/Obstetrics                            Anesthesia Physical Anesthesia Plan  ASA: 3  Anesthesia Plan: General   Post-op Pain Management: Ketamine IV, Ofirmev IV (intra-op) and Dilaudid IV   Induction: Intravenous  PONV Risk Score and Plan: 4 or greater and Ondansetron, Dexamethasone and Midazolam  Airway Management Planned: Oral ETT  Additional Equipment: None  Intra-op Plan:   Post-operative Plan: Extubation in OR  Informed Consent: I have reviewed the patients History and Physical, chart, labs and discussed the procedure including the risks, benefits and alternatives for the proposed anesthesia with the patient or authorized representative who has indicated his/her understanding and acceptance.     Dental advisory given  Plan Discussed with: CRNA and Surgeon  Anesthesia Plan Comments: (Discussed risks of anesthesia with patient, including PONV, sore throat, lip/dental/eye damage. Rare risks discussed as well, such as cardiorespiratory and neurological sequelae, and allergic reactions. Discussed the role of CRNA in patient's perioperative care. Patient understands. Patient counseled on benefits of smoking cessation, and increased perioperative risks associated with continued smoking. )        Anesthesia Quick Evaluation

## 2021-03-10 NOTE — Anesthesia Postprocedure Evaluation (Signed)
Anesthesia Post Note  [name]  Procedure(s) Performed: COLECTOMY WITH COLOSTOMY CREATION/HARTMANN PROCEDURE  Patient location during evaluation: PACU Anesthesia Type: General Level of consciousness: awake and alert, oriented and patient cooperative Pain management: pain level controlled Vital Signs Assessment: post-procedure vital signs reviewed and stable Respiratory status: spontaneous breathing, nonlabored ventilation and respiratory function stable Cardiovascular status: blood pressure returned to baseline and stable Postop Assessment: adequate PO intake Anesthetic complications: no   No notable events documented.   Last Vitals:  Vitals:   03/10/21 1115 03/10/21 1147  BP: (!) 130/95 (!) 160/96  Pulse: 95 77  Resp: 12 19  Temp:  (!) 36.4 C  SpO2: 99% 95%    Last Pain:  Vitals:   03/10/21 0733  TempSrc:   PainSc: 0-No pain                 Darrin Nipper

## 2021-03-10 NOTE — Progress Notes (Signed)
[place] [facility] [place] NAME: Brittany [name]    MR#:  [id number]  DATE OF BIRTH:  [birth year]  SUBJECTIVE:   patient seen earlier postop. She is sedated from anesthesia. Open eyes to verbal command. Overall per Dr. Dahlia Byes surgery went well. REVIEW OF SYSTEMS:   Review of Systems  Constitutional:  Negative for chills, fever and weight loss.  HENT:  Negative for ear discharge, ear pain and nosebleeds.   Eyes:  Negative for blurred vision, pain and discharge.  Respiratory:  Negative for sputum production, shortness of breath, wheezing and stridor.   Cardiovascular:  Negative for chest pain, palpitations, orthopnea and PND.  Gastrointestinal:  Negative for abdominal pain, diarrhea, nausea and vomiting.  Genitourinary:  Negative for frequency and urgency.  Musculoskeletal:  Negative for back pain and joint pain.  Neurological:  Positive for weakness. Negative for sensory change, speech change and focal weakness.  Psychiatric/Behavioral:  Negative for depression and hallucinations. The patient is not nervous/anxious.    DRUG ALLERGIES:   Allergies  Allergen Reactions   Cephalosporins Itching    TOLERATED ZOSYN (PIPERACILLIN) BEFORE   Hydromorphone Itching   Keflin [Cephalothin] Itching   Lactose Intolerance (Gi) Diarrhea    VITALS:  Blood pressure (!) 160/96, pulse 77, temperature (!) 97.5 F (36.4 C), resp. rate 19, height 5' (1.524 m), weight 44 kg, SpO2 95 %.  PHYSICAL EXAMINATION:   Physical Exam  GENERAL:  61 y.o.-year-old patient lying in the bed with no acute distress.thin fraile  HEENT: Head atraumatic, normocephalic. Oropharynx and nasopharynx clear.  LUNGS: Normal breath sounds bilaterally, no wheezing, rales, rhonchi.  CARDIOVASCULAR: S1, S2 normal. No murmurs, rubs, or gallops.  ABDOMEN: Hartmans pouch + EXTREMITIES: No  edema b/l.    NEUROLOGIC: nonfocal PSYCHIATRIC:  patient is alert and awake SKIN: No obvious rash,  lesion, or ulcer.   LABORATORY PANEL:  CBC Recent Labs  Lab 03/10/21 0707  WBC 6.2  HGB 9.6*  HCT 29.3*  PLT 731*     Chemistries  Recent Labs  Lab 03/08/21 0455 03/09/21 0424 03/10/21 0707  NA 134*  --   --   K 2.9*   < > 3.1*  CL 99  --   --   CO2 28  --   --   GLUCOSE 327*  --   --   BUN <5*  --   --   CREATININE 0.56  --   --   CALCIUM 7.9*  --   --   MG 1.6*   < > 1.7  AST 12*  --   --   ALT 9  --   --   ALKPHOS 89  --   --   BILITOT 0.4  --   --    < > = values in this interval not displayed.    Cardiac Enzymes No results for input(s): TROPONINI in the last 168 hours. RADIOLOGY:  CT ABDOMEN PELVIS W CONTRAST  Result Date: 03/09/2021 CLINICAL DATA:  Abdominal pain. Reassessment diverticulitis with abscess formation. EXAM: CT ABDOMEN AND PELVIS WITH CONTRAST TECHNIQUE: Multidetector CT imaging of the abdomen and pelvis was performed using the standard protocol following bolus administration of intravenous contrast. RADIATION DOSE REDUCTION: This exam was performed according to the departmental dose-optimization program which includes automated exposure control, adjustment of the mA and/or kV according to patient size and/or use of iterative reconstruction technique. CONTRAST:  80mL OMNIPAQUE IOHEXOL 300 MG/ML  SOLN COMPARISON:  CT abdomen and  pelvis 03/05/2021 FINDINGS: Lower chest: Respiratory motion and mild atelectasis in the lung bases. No pleural effusion. Hepatobiliary: No focal liver abnormality is seen. No gallstones, gallbladder wall thickening, or biliary dilatation. Pancreas: Status post distal pancreatectomy. Unchanged multiloculated cystic mass involving the pancreatic head/uncinate process with maximal diameter 4.6 cm on axial images. Spleen: Status post splenectomy. Adrenals/Urinary Tract: Unremarkable right adrenal gland. Unchanged 1.2 cm left adrenal nodule, more fully characterized on recent MRI. No evidence of a renal mass, calculi, or hydronephrosis.  Unremarkable bladder. Stomach/Bowel: The stomach is unremarkable. There is no evidence of bowel obstruction. The inflamed sigmoid colon with associated intramural abscess formation has not significantly changed from the prior CT. There is increased wall thickening versus underdistention of the more distal sigmoid colon and rectum. An abscess lateral to the inflamed proximal sigmoid colon at the anterior aspect of the left iliac fossa has mildly decreased in size, now measuring 2.8 x 1.4 cm (series 5, image 14, previously 4.5 x 2.3 cm), in this abscess is again noted to communicate with an additional abscess more superficially in the left inguinal region which has also mildly decreased in size, measuring 4.3 x 3.5 cm (series 5, image 17, previously 5.3 x 3.8 cm). No new abscess is identified. Vascular/Lymphatic: Abdominal aortic atherosclerosis without aneurysm. No enlarged lymph nodes. Reproductive: Grossly unremarkable uterus.  Tubal ligation clips. Other: No significant ascites. No pneumoperitoneum. Musculoskeletal: No acute osseous abnormality or suspicious osseous lesion. Advanced lower lumbar facet arthrosis with grade 1 anterolisthesis of L3 on L4. IMPRESSION: 1. Unchanged appearance of the inflamed proximal sigmoid colon consistent with diverticulitis with intramural abscess formation. Increased wall thickening versus underdistention of the more distal sigmoid colon and rectum. 2. Mildly decreased size of 2 abscesses extending to the left inguinal region. 3. Unchanged multiloculated cystic pancreatic mass. 4. Aortic Atherosclerosis (ICD10-I70.0). Electronically Signed   By: Logan Bores M.D.   On: 03/09/2021 10:33   ASSESSMENT AND PLAN:  Brittany [name] is a 61 y.o. 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  C. difficile PCR positive on 02/14/2021 treated with vancomycin. pt presented from home to the ED on 03/02/2021 with LLQ pain associated with N/V and diarrhea x several days  Colonic diverticular abscess, recurrent- s/p superficial abscess aspiration 1/20. I&D 1/22. Cultures showing e coli with sensitivities to zosyn. Patient has cephalosporin allergy. Afebrile about 24 hours. Incision from I&D without erythema or drainage. Iodoform packing in place. repeat abdominal CT 1/22 not showing significant improvement. - general surgery following--pt did not want surgery today -IV Zosyn/vanc for total 10 days, pain control, IV antiemetics -- repeat CT scan 1/26-- shows no improvement in diverticular disease and continues to have intramural abscess. -- Surgery has spoken with patient and her daughter. Patient is now in agreement for surgery which is scheduled for tomorrow --1/27--POD 0  --Laparotomy w Hartmann's procedure,Takedown of splenic flexure And  I/D of Left complex inguinal abscess   Hypokalemia- Repletion per Pharmacy   IPMN (intraductal papillary mucinous neoplasm) s/p partial pancreatectomy and splenectomy- contributing to chronic diarrhea - Continue Pancrease at increased dose   Multifactorial diarrhea- Suspect persistent C. difficile infection -- continues to have frequent loose bms  --Clostridium difficile infection 02/14/2021. -  continue oral vancomycin- complete course 10 days (d/w ID pharmacist)   Hyperglycemia due to type 2 diabetes mellitus (HCC) -  SSI     Restless leg syndrome- chronic, stable on home medications - Continue ropinirole         Procedures: Diverticular abscess drainage per IR Laparotomy with Hartman's pouch Family communication :dter on the phone 1/25 Consults :surgery, IR CODE STATUS: full DVT Prophylaxis :lovenox Level  of care: Med-Surg Status is: Inpatient  Remains inpatient appropriate because: diverticular abscess and anticipating patient agrees with surgery   TOTAL TIME TAKING CARE OF THIS PATIENT: 25 minutes.  >50% time spent on counselling and coordination of care  Note: This dictation was prepared with Dragon dictation along with smaller phrase technology. Any transcriptional errors that result from this process are unintentional.  Fritzi Mandes M.D    [facility]   CC: Primary care physician; Clinic, [facility] Patient ID: Brittany [name], female   DOB: [birth year], 61 y.o.   MRN: [id number]

## 2021-03-10 NOTE — Consult Note (Signed)
PHARMACY CONSULT NOTE - FOLLOW UP  Pharmacy Consult for Electrolyte Monitoring and Replacement   Recent Labs: Potassium (mmol/L)  Date Value  03/10/2021 3.1 (L)  08/17/2011 3.6   Magnesium (mg/dL)  Date Value  03/10/2021 1.7   Calcium (mg/dL)  Date Value  03/08/2021 7.9 (L)   Calcium, Total (mg/dL)  Date Value  08/17/2011 9.7   Albumin (g/dL)  Date Value  03/08/2021 1.9 (L)  04/04/2011 4.5   Phosphorus (mg/dL)  Date Value  03/10/2021 3.8   Sodium (mmol/L)  Date Value  03/08/2021 134 (L)  08/17/2011 139    Assessment: Patient admitted for colonic diverticular abscess. Abx and aspiration completed. Surgical intervention not planned fr now. Patient on liquid dit and oral supplement. Noted multifactorial diarrhea and low K. Pharmacy consulted for electrolytes replacement.  Goal of Therapy:  Electrolytes within normal range  Plan:  K improved from 2.9 to 3.1 to day after receiving 16mEq of KCl yesterday. Will order KCL 46meq IV x 1 hrs x 4 hors today. Mg, Ca, and phos remain within normal limits.  Will continue to follow and replace electrolytes as needed.  Davell Beckstead Rodriguez-Guzman PharmD, BCPS 03/10/2021 8:27 AM

## 2021-03-10 NOTE — Transfer of Care (Signed)
Immediate Anesthesia Transfer of Care Note  [name]  Procedure(s) Performed: COLECTOMY WITH COLOSTOMY CREATION/HARTMANN PROCEDURE  Patient Location: PACU  Anesthesia Type:General  Level of Consciousness: drowsy  Airway & Oxygen Therapy: Patient Spontanous Breathing and Patient connected to nasal cannula oxygen  Post-op Assessment: Report given to RN and Post -op Vital signs reviewed and stable  Post vital signs: Reviewed and stable  Last Vitals:  Vitals Value Taken Time  BP 124/86 03/10/21 1030  Temp    Pulse 80 03/10/21 1031  Resp 13 03/10/21 1031  SpO2 100 % 03/10/21 1031  Vitals shown include unvalidated device data.  Last Pain:  Vitals:   03/10/21 0733  TempSrc:   PainSc: 0-No pain      Patients Stated Pain Goal: 0 (22/44/97 5300)  Complications: No notable events documented.

## 2021-03-10 NOTE — TOC Progression Note (Signed)
Transition of Care [facility]) - Progression Note    Patient Details  Name: Brittany [name] MRN: [id number] Date of Birth: 24-Dec-1960  Transition of Care [facility]) CM/SW [place], RN Phone Number: 03/10/2021, 2:32 PM  Clinical Narrative:   Patient had surgery today, TOC will follow up this weekend to assess for further disposition.      Expected Discharge Plan:  (TBD) Barriers to Discharge: Continued Medical Work up  Expected Discharge Plan and Services Expected Discharge Plan:  (TBD)       Living arrangements for the past 2 months: Single Family Home                                       Social Determinants of Health (SDOH) Interventions    Readmission Risk Interventions Readmission Risk Prevention Plan 03/06/2021 02/16/2021  Transportation Screening Complete Complete  Medication Review Press photographer) Complete Complete  PCP or Specialist appointment within 3-5 days of discharge Complete -  SW Recovery Care/Counseling Consult - Complete  Palliative Care Screening Not Applicable Not [place] Not Applicable Not Applicable  Some recent data might be hidden

## 2021-03-10 NOTE — Care Management Important Message (Signed)
Important Message  Patient Details  Name: Brittany [name] MRN: [id number] Date of Birth: 1960-04-13   Medicare Important Message Given:  Yes  Patient is in an isolation room so I reviewed the Important Message from Medicare with her by phone [phone]).  She said she understood her rights but wasn't sure when she would be discharged as she just had surgery.  I wished her well and thanked her for her time.   Juliann Pulse A Kamonte Mcmichen 03/10/2021, 2:16 PM

## 2021-03-10 NOTE — Progress Notes (Signed)
PHARMACIST - PHYSICIAN COMMUNICATION  CONCERNING:  Enoxaparin (Lovenox) for DVT Prophylaxis    RECOMMENDATION: Patient was prescribed enoxaparin 40mg  q24 hours for VTE prophylaxis.   Filed Weights   03/03/21 0217 03/10/21 0733  Weight: 44 kg (97 lb) 44 kg (97 lb)    Body mass index is 18.94 kg/m.  Estimated Creatinine Clearance: 51.3 mL/min (by C-G formula based on SCr of 0.56 mg/dL).  Patient is candidate for enoxaparin 30mg  every 24 hours based on CrCl <41ml/min or Weight <45kg  DESCRIPTION: Pharmacy has adjusted enoxaparin dose per [facility] policy.  Patient is now receiving enoxaparin 30 mg every 24 hours   Benita Gutter 03/10/2021 2:25 PM

## 2021-03-10 NOTE — Progress Notes (Signed)
Preoperative Review  ° °Patient is met in the preoperative holding area. The history is reviewed in the chart and with the patient. I personally reviewed the options and rationale as well as the risks of this procedure that have been previously discussed with the patient. All questions asked by the patient and/or family were answered to their satisfaction. ° °Patient agrees to proceed with this procedure at this time. ° °Zanyla Klebba M.D. FACS °  °

## 2021-03-10 NOTE — Op Note (Addendum)
PROCEDURES: 1. Laparotomy w Hartmann's procedure 2. Takedown of splenic flexure 3. I/D of Left complex inguinal abscess 4. Excisional debridement skin , muscle and fascia of Left Inguinal abscess total 21cm2 7x 3 cms  Pre-operative Diagnosis: Perforated diverticulitis w abscess into the left thigh and groin  Post-operative Diagnosis: same  Surgeon: Jules Husbands MD FACS  Assistants: Amado Coe PA-S  Anesthesia: General endotracheal anesthesia  ASA Class: 3  Surgeon: Caroleen Hamman , MD FACS  Anesthesia: Gen. with endotracheal tube  Findings: Diverticulitis with Phlegmon and abscess communicating to the left inguinal region in a extraperitoneal fashion Tension free ostomy w good perfusion  Estimated Blood Loss: 100cc         Drains: pelvis 19 Fr         Specimens: sigmoid colon          Complications: none               Condition: stable  Procedure Details  The patient was seen again in the Holding Room. The benefits, complications, treatment options, and expected outcomes were discussed with the patient. The risks of bleeding, infection, recurrence of symptoms, failure to resolve symptoms,  bowel injury, any of which could require further surgery were reviewed with the patient.   The patient was taken to Operating Room, identified as Brittany [name] and the procedure verified.  A Time Out was held and the above information confirmed.  Prior to the induction of general anesthesia, antibiotic prophylaxis was administered. VTE prophylaxis was in place. General endotracheal anesthesia was then administered and tolerated well. After the induction, the abdomen and thighs were prepped with Chloraprep and draped in the sterile fashion. The patient was positioned in the supine position. Generous midline laparotomy was performed and the abdomen was entered in the standard fashion after the incising the peritoneum.  There was no evidence of purulent cereal for feces upon entering the  abdominal cavity.  We did found a complex phlegmon within the sigmoid colon attached to the left pelvic wall.  Careful dissection was performed with finger infracturing as well as electrocautery in order to dissect the phlegmon from the pelvic wall.  We were finally able to free the phlegmon from the left pelvic wall.  We made sure we appropriately identified and preserve the left ureter.  Was a track communicating into the left inguinal region.  I was again able to do an incision and drainage of the left inguinal abscess this was done with a combination of a knife as well as suction device.  We were able to drain more purulent fluid from the left groin.  I was able to put my hands in the abdominal cavity as well as my other hand within the left inguinal region and there was a tract that communicated.  Excisional Debridement of the tract was performed with curette and suction device.  This included skin subcutaneous tissue and muscle.  Good hemostasis obtained with cautery. Attention then was turned to the sigmoid phlegmon.  A lateral to medial dissection was performed.  The inferior mesenteric artery and vein were isolated and suture ligated with 2-0 silk sutures.  The rest of the mesentery was divided with Enseal device. Reselected with good healthy margins on the proximal and distal aspect of the disease.  Using a GIA I was able to resect the distal sigmoid colon with a 75 GIA stapler.  Identical steps were taken to resect the proximal descending colon in the standard fashion.  Specimen  was removed and sent for permanent pathology. Were able to takedown the splenic flexure with electrocautery in the standard fashion.  We irrigated the abdominal cavity profusely with 2 L of warm normal saline.  There was no evidence of any injuries or any undrained pockets. A 19 Blake drain was placed within the pelvis.  I was able to perform skin incision in the left lower quadrant and electrocautery was used to dissect  through subcutaneous tissue and the fascia was incised in a cruciate form.  I was able to pass to fingers throughout the ostomy defect and was able to exteriorize the descending colon. Once again look intra-abdominal and there was no evidence of any injury and there was excellent hemostasis. Multiple Seprafilm's were placed to prevent further adhesions. Liposomal Marcaine was used throughout the abdominal wall to perform tap blocks bilaterally.  The fascia was closed using the small bite technique with a 0 PDS in a running fashion.  The skin was left to heal by secondary intention and a Kerlix was used.  Attention was turned to the left lower quadrant we were able to mature the end colostomy in a Brooke fashion with multiple interrupted 3-0 Vicryl's.  Ostomy appliance secure.  Ostomy was well perfused and widely patent.   Needle and laparotomy count were correct and there were no immediate complications.  Caroleen Hamman, MD, FACS

## 2021-03-11 ENCOUNTER — Encounter: Payer: Self-pay | Admitting: Surgery

## 2021-03-11 LAB — GLUCOSE, CAPILLARY
Glucose-Capillary: 475 mg/dL — ABNORMAL HIGH (ref 70–99)
Glucose-Capillary: 501 mg/dL (ref 70–99)
Glucose-Capillary: 245 mg/dL — ABNORMAL HIGH (ref 70–99)
Glucose-Capillary: 72 mg/dL (ref 70–99)
Glucose-Capillary: 131 mg/dL — ABNORMAL HIGH (ref 70–99)

## 2021-03-11 LAB — AEROBIC/ANAEROBIC CULTURE W GRAM STAIN (SURGICAL/DEEP WOUND)

## 2021-03-11 MED ORDER — INSULIN GLARGINE-YFGN 100 UNIT/ML ~~LOC~~ SOLN
8.0000 [IU] | Freq: Two times a day (BID) | SUBCUTANEOUS | Status: DC
  Filled 2021-03-11 (×2): qty 0.08

## 2021-03-11 MED ORDER — GLUCERNA SHAKE PO LIQD
237.0000 mL | Freq: Three times a day (TID) | ORAL | Status: DC
  Administered 2021-03-11 – 2021-03-14 (×2): 237 mL via ORAL

## 2021-03-11 MED ORDER — INSULIN ASPART 100 UNIT/ML IJ SOLN
INTRAMUSCULAR | Status: AC
  Administered 2021-03-11: 6 [IU] via SUBCUTANEOUS
  Filled 2021-03-11: qty 1

## 2021-03-11 MED ORDER — INSULIN ASPART 100 UNIT/ML IJ SOLN
0.0000 [IU] | Freq: Every day | INTRAMUSCULAR | Status: DC
  Administered 2021-03-12: 22:00:00 2 [IU] via SUBCUTANEOUS
  Administered 2021-03-13: 3 [IU] via SUBCUTANEOUS
  Filled 2021-03-11 (×2): qty 1

## 2021-03-11 MED ORDER — INSULIN ASPART 100 UNIT/ML IJ SOLN
25.0000 [IU] | Freq: Once | INTRAMUSCULAR | Status: AC
  Administered 2021-03-11: 11:00:00 25 [IU] via SUBCUTANEOUS
  Filled 2021-03-11: qty 1

## 2021-03-11 MED ORDER — INSULIN ASPART 100 UNIT/ML IJ SOLN
0.0000 [IU] | Freq: Three times a day (TID) | INTRAMUSCULAR | Status: DC
  Administered 2021-03-12: 18:00:00 5 [IU] via SUBCUTANEOUS
  Administered 2021-03-12: 12:00:00 3 [IU] via SUBCUTANEOUS
  Administered 2021-03-13 (×2): 5 [IU] via SUBCUTANEOUS
  Filled 2021-03-11 (×5): qty 1

## 2021-03-11 MED ORDER — INSULIN GLARGINE-YFGN 100 UNIT/ML ~~LOC~~ SOLN
10.0000 [IU] | Freq: Two times a day (BID) | SUBCUTANEOUS | Status: DC
  Administered 2021-03-11 – 2021-03-12 (×2): 10 [IU] via SUBCUTANEOUS
  Filled 2021-03-11 (×3): qty 0.1

## 2021-03-11 NOTE — Consult Note (Signed)
PHARMACY CONSULT NOTE - FOLLOW UP  Pharmacy Consult for Electrolyte Monitoring and Replacement   Recent Labs: Potassium (mmol/L)  Date Value  03/10/2021 3.1 (L)  08/17/2011 3.6   Magnesium (mg/dL)  Date Value  03/10/2021 1.7   Calcium (mg/dL)  Date Value  03/08/2021 7.9 (L)   Calcium, Total (mg/dL)  Date Value  08/17/2011 9.7   Albumin (g/dL)  Date Value  03/08/2021 1.9 (L)  04/04/2011 4.5   Phosphorus (mg/dL)  Date Value  03/10/2021 3.8   Sodium (mmol/L)  Date Value  03/08/2021 134 (L)  08/17/2011 139    Assessment: Patient admitted for colonic diverticular abscess. Abx and aspiration completed. Surgical intervention not planned fr now. Patient on liquid dit and oral supplement. Noted multifactorial diarrhea and low K. Pharmacy consulted for electrolytes replacement.  Goal of Therapy:  Electrolytes within normal range  Plan:  No labs.  F/u with AM labs.   Eleonore Chiquito, PharmD, BCPS 03/11/2021 9:16 AM

## 2021-03-11 NOTE — Progress Notes (Signed)
[place] [facility] [place] NAME: Brittany [name]    MR#:  [id number]  DATE OF BIRTH:  1960/09/01  SUBJECTIVE:  postop day one. Patient is tolerating PO diet. Good ostomy output. Denies any pain. Sugars high due to regular food. Drinking a lot of criterions. Patient asked to avoid juices. Diet change to carb control.   REVIEW OF SYSTEMS:   Review of Systems  Constitutional:  Negative for chills, fever and weight loss.  HENT:  Negative for ear discharge, ear pain and nosebleeds.   Eyes:  Negative for blurred vision, pain and discharge.  Respiratory:  Negative for sputum production, shortness of breath, wheezing and stridor.   Cardiovascular:  Negative for chest pain, palpitations, orthopnea and PND.  Gastrointestinal:  Negative for abdominal pain, diarrhea, nausea and vomiting.  Genitourinary:  Negative for frequency and urgency.  Musculoskeletal:  Negative for back pain and joint pain.  Neurological:  Positive for weakness. Negative for sensory change, speech change and focal weakness.  Psychiatric/Behavioral:  Negative for depression and hallucinations. The patient is not nervous/anxious.    DRUG ALLERGIES:   Allergies  Allergen Reactions   Cephalosporins Itching    TOLERATED ZOSYN (PIPERACILLIN) BEFORE   Hydromorphone Itching   Keflin [Cephalothin] Itching   Lactose Intolerance (Gi) Diarrhea    VITALS:  Blood pressure 122/82, pulse 91, temperature 98.2 F (36.8 C), temperature source Oral, resp. rate 16, height 5' (1.524 m), weight 44 kg, SpO2 100 %.  PHYSICAL EXAMINATION:   Physical Exam  GENERAL:  61 y.o.-year-old patient lying in the bed with no acute distress.thin fraile  HEENT: Head atraumatic, normocephalic. Oropharynx and nasopharynx clear.  LUNGS: Normal breath sounds bilaterally, no wheezing, rales, rhonchi.  CARDIOVASCULAR: S1, S2 normal. No murmurs, rubs, or gallops.  ABDOMEN: Hartmans pouch + EXTREMITIES: No  edema  b/l.    NEUROLOGIC: nonfocal PSYCHIATRIC:  patient is alert and awake SKIN: No obvious rash, lesion, or ulcer.   LABORATORY PANEL:  CBC Recent Labs  Lab 03/10/21 0707  WBC 6.2  HGB 9.6*  HCT 29.3*  PLT 731*     Chemistries  Recent Labs  Lab 03/08/21 0455 03/09/21 0424 03/10/21 0707  NA 134*  --   --   K 2.9*   < > 3.1*  CL 99  --   --   CO2 28  --   --   GLUCOSE 327*  --   --   BUN <5*  --   --   CREATININE 0.56  --   --   CALCIUM 7.9*  --   --   MG 1.6*   < > 1.7  AST 12*  --   --   ALT 9  --   --   ALKPHOS 89  --   --   BILITOT 0.4  --   --    < > = values in this interval not displayed.    Cardiac Enzymes No results for input(s): TROPONINI in the last 168 hours. RADIOLOGY:  No results found. ASSESSMENT AND PLAN:  Brittany [name] is a 61 y.o. 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  C. difficile PCR positive on 02/14/2021 treated with  vancomycin. pt presented from home to the ED on 03/02/2021 with LLQ pain associated with N/V and diarrhea x several days  Colonic diverticular abscess, recurrent- s/p superficial abscess aspiration 1/20. I&D 1/22. Cultures showing e coli with sensitivities to zosyn. Patient has cephalosporin allergy. Afebrile about 24 hours. Incision from I&D without erythema or drainage. Iodoform packing in place. repeat abdominal CT 1/22 not showing significant improvement. - general surgery following--pt did not want surgery today -IV Zosyn/vanc for total 10 days, pain control, IV antiemetics -- repeat CT scan 1/26-- shows no improvement in diverticular disease and continues to have intramural abscess. -- Surgery  has spoken with patient and her daughter. Patient is now in agreement for surgery which is scheduled for tomorrow --1/27--POD 0  --Laparotomy w Hartmann's procedure,Takedown of splenic flexure And  I/D of Left complex inguinal abscess --1/28-- POD 1-- tolerating PO diet well. Ostomy output good. No fever. Sugars a bit high. Additional insulin given  Hypokalemia- Repletion per Pharmacy   IPMN (intraductal papillary mucinous neoplasm) s/p partial pancreatectomy and splenectomy- contributing to chronic diarrhea - Continue Pancrease at increased dose   Multifactorial diarrhea- Suspect persistent C. difficile infection -- continues to have frequent loose bms  --Clostridium difficile infection 02/14/2021. - continue oral vancomycin- complete course 10 days (d/w ID pharmacist)   Hyperglycemia due to type 2 diabetes mellitus ([place]) -  SSI -- long-acting insulin along with sliding scale. Carb control diet.     Restless leg syndrome- chronic, stable on home medications - Continue ropinirole         Procedures: Diverticular abscess drainage per IR Laparotomy with Hartman's pouch Family communication :none today Consults :surgery, IR CODE STATUS: full DVT Prophylaxis :lovenox Level of care: Med-Surg Status is: Inpatient  Remains inpatient appropriate because: diverticular abscess and anticipating patient agrees with surgery   TOTAL TIME TAKING CARE OF THIS PATIENT: 25 minutes.  >50% time spent on counselling and coordination of care  Note: This dictation was prepared with Dragon dictation along with smaller phrase technology. Any transcriptional errors that result from this process are unintentional.  Fritzi Mandes M.D    [facility]   CC: Primary care physician; Clinic, [facility] Patient ID: Brittany [name], female   DOB: [birth year], 61 y.o.   MRN: [id number]
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
## 2021-03-12 LAB — BASIC METABOLIC PANEL
Anion gap: 8 (ref 5–15)
BUN: 13 mg/dL (ref 8–23)
CO2: 27 mmol/L (ref 22–32)
Calcium: 8.5 mg/dL — ABNORMAL LOW (ref 8.9–10.3)
Chloride: 101 mmol/L (ref 98–111)
Creatinine, Ser: 0.75 mg/dL (ref 0.44–1.00)
GFR, Estimated: >60 mL/min (ref >60)
Glucose, Bld: 104 mg/dL — ABNORMAL HIGH (ref 70–99)
Potassium: 3.8 mmol/L (ref 3.5–5.1)
Sodium: 136 mmol/L (ref 135–145)

## 2021-03-12 LAB — GLUCOSE, CAPILLARY
Glucose-Capillary: 73 mg/dL (ref 70–99)
Glucose-Capillary: 83 mg/dL (ref 70–99)
Glucose-Capillary: 151 mg/dL — ABNORMAL HIGH (ref 70–99)
Glucose-Capillary: 215 mg/dL — ABNORMAL HIGH (ref 70–99)
Glucose-Capillary: 217 mg/dL — ABNORMAL HIGH (ref 70–99)

## 2021-03-12 LAB — MAGNESIUM: Magnesium: 1.7 mg/dL (ref 1.7–2.4)

## 2021-03-12 MED ORDER — INSULIN GLARGINE-YFGN 100 UNIT/ML ~~LOC~~ SOLN
7.0000 [IU] | Freq: Two times a day (BID) | SUBCUTANEOUS | Status: DC
  Administered 2021-03-12 – 2021-03-13 (×2): 7 [IU] via SUBCUTANEOUS
  Filled 2021-03-12 (×3): qty 0.07

## 2021-03-12 MED ORDER — MORPHINE SULFATE (PF) 2 MG/ML IV SOLN
2.0000 mg | INTRAVENOUS | Status: DC | PRN
  Administered 2021-03-12 – 2021-03-14 (×5): 2 mg via INTRAVENOUS
  Filled 2021-03-12 (×5): qty 1

## 2021-03-12 MED ORDER — AMOXICILLIN-POT CLAVULANATE 875-125 MG PO TABS
1.0000 | ORAL_TABLET | Freq: Two times a day (BID) | ORAL | Status: AC
  Administered 2021-03-12 – 2021-03-13 (×4): 1 via ORAL
  Filled 2021-03-12 (×4): qty 1

## 2021-03-12 MED ORDER — MAGNESIUM SULFATE 2 GM/50ML IV SOLN
2.0000 g | Freq: Once | INTRAVENOUS | Status: AC
  Administered 2021-03-12: 09:00:00 2 g via INTRAVENOUS
  Filled 2021-03-12: qty 50

## 2021-03-12 MED ORDER — OXYCODONE HCL 5 MG PO TABS
5.0000 mg | ORAL_TABLET | Freq: Four times a day (QID) | ORAL | Status: DC | PRN
  Administered 2021-03-12 – 2021-03-14 (×6): 5 mg via ORAL
  Filled 2021-03-12 (×6): qty 1

## 2021-03-12 NOTE — Consult Note (Signed)
[place] Nurse ostomy consult note  [facility] Nursing Team is in receipt of consult request for patient with new ostomy.  First visit planned for Monday, 03/13/21.  [place] nursing team will follow for ostomy management and teaching, and will remain available to this patient, the nursing, surgical and medical teams.  Thanks, Maudie Flakes, MSN, RN, [place], Arther Abbott  Pager# [phone]

## 2021-03-12 NOTE — Consult Note (Signed)
PHARMACY CONSULT NOTE - FOLLOW UP  Pharmacy Consult for Electrolyte Monitoring and Replacement   Recent Labs: Potassium (mmol/L)  Date Value  03/12/2021 3.8  08/17/2011 3.6   Magnesium (mg/dL)  Date Value  03/12/2021 1.7   Calcium (mg/dL)  Date Value  03/12/2021 8.5 (L)   Calcium, Total (mg/dL)  Date Value  08/17/2011 9.7   Albumin (g/dL)  Date Value  03/08/2021 1.9 (L)  04/04/2011 4.5   Phosphorus (mg/dL)  Date Value  03/10/2021 3.8   Sodium (mmol/L)  Date Value  03/12/2021 136  08/17/2011 139    Assessment: Patient admitted for colonic diverticular abscess. Abx and aspiration completed. Surgical intervention not planned fr now. Patient on liquid dit and oral supplement. Noted multifactorial diarrhea and low K. Pharmacy consulted for electrolytes replacement.  Goal of Therapy:  Electrolytes within normal range  Plan:  Mg 2 g IV x 1 F/u with AM labs.   Eleonore Chiquito, PharmD, BCPS 03/12/2021 7:54 AM

## 2021-03-12 NOTE — Progress Notes (Signed)
[facility] [place] NAME: Brittany [name]    MR#:  [id number]  DATE OF BIRTH:  26-Jun-1960  SUBJECTIVE:  sugars much better after changing to carb control diet. Overall doing well. Ostomy site looks stable. Patient has some pain at the ostomy site  REVIEW OF SYSTEMS:   Review of Systems  Constitutional:  Negative for chills, fever and weight loss.  HENT:  Negative for ear discharge, ear pain and nosebleeds.   Eyes:  Negative for blurred vision, pain and discharge.  Respiratory:  Negative for sputum production, shortness of breath, wheezing and stridor.   Cardiovascular:  Negative for chest pain, palpitations, orthopnea and PND.  Gastrointestinal:  Negative for abdominal pain, diarrhea, nausea and vomiting.  Genitourinary:  Negative for frequency and urgency.  Musculoskeletal:  Negative for back pain and joint pain.  Neurological:  Positive for weakness. Negative for sensory change, speech change and focal weakness.  Psychiatric/Behavioral:  Negative for depression and hallucinations. The patient is not nervous/anxious.    DRUG ALLERGIES:   Allergies  Allergen Reactions   Cephalosporins Itching    TOLERATED ZOSYN (PIPERACILLIN) BEFORE   Hydromorphone Itching   Keflin [Cephalothin] Itching   Lactose Intolerance (Gi) Diarrhea    VITALS:  Blood pressure (!) 146/94, pulse 88, temperature 98 F (36.7 C), resp. rate 16, height 5' (1.524 m), weight 44 kg, SpO2 98 %.  PHYSICAL EXAMINATION:   Physical Exam  GENERAL:  61 y.o.-year-old patient lying in the bed with no acute distress.thin fraile  HEENT: Head atraumatic, normocephalic. Oropharynx and nasopharynx clear.  LUNGS: Normal breath sounds bilaterally, no wheezing, rales, rhonchi.  CARDIOVASCULAR: S1, S2 normal. No murmurs, rubs, or gallops.  ABDOMEN: Hartmans pouch + EXTREMITIES: No  edema b/l.    NEUROLOGIC: nonfocal PSYCHIATRIC:  patient is alert and awake SKIN: No obvious  rash, lesion, or ulcer.   LABORATORY PANEL:  CBC Recent Labs  Lab 03/10/21 0707  WBC 6.2  HGB 9.6*  HCT 29.3*  PLT 731*     Chemistries  Recent Labs  Lab 03/08/21 0455 03/09/21 0424 03/12/21 0412  NA 134*  --  136  K 2.9*   < > 3.8  CL 99  --  101  CO2 28  --  27  GLUCOSE 327*  --  104*  BUN <5*  --  13  CREATININE 0.56  --  0.75  CALCIUM 7.9*  --  8.5*  MG 1.6*   < > 1.7  AST 12*  --   --   ALT 9  --   --   ALKPHOS 89  --   --   BILITOT 0.4  --   --    < > = values in this interval not displayed.    Cardiac Enzymes No results for input(s): TROPONINI in the last 168 hours. RADIOLOGY:  No results found. ASSESSMENT AND PLAN:  Brittany [name] is a 61 y.o. 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  C. difficile PCR positive on 02/14/2021 treated with vancomycin. pt presented from home to the ED on 03/02/2021 with LLQ pain associated with N/V and diarrhea x several days  Colonic diverticular abscess, recurrent- s/p superficial  abscess aspiration 1/20. I&D 1/22. Cultures showing e coli with sensitivities to zosyn. Patient has cephalosporin allergy. Afebrile about 24 hours. Incision from I&D without erythema or drainage. Iodoform packing in place. repeat abdominal CT 1/22 not showing significant improvement. - general surgery following--pt did not want surgery today -IV Zosyn/vanc for total 10 days, pain control, IV antiemetics -- repeat CT scan 1/26-- shows no improvement in diverticular disease and continues to have intramural abscess. -- Surgery has spoken with patient and her daughter. Patient is now in agreement for surgery which is  scheduled for tomorrow --1/27--POD 0  --Laparotomy w Hartmann's procedure,Takedown of splenic flexure And  I/D of Left complex inguinal abscess --1/28-- POD 1-- tolerating PO diet well. Ostomy output good. No fever. Sugars a bit high. Additional insulin given --1/29--POD 2-- ostomy output good. No fever. Sugars much better. Wound consult for ostomy education. Spoke with daughter Brittany [name] who is very well aware of the ostomy since she used to help her father also do ostomy care.  Hypokalemia- Repletion per Pharmacy   IPMN (intraductal papillary mucinous neoplasm) s/p partial pancreatectomy and splenectomy- contributing to chronic diarrhea - Continue Pancrease at increased dose   Multifactorial diarrhea- Suspect persistent C. difficile infection -- continues to have frequent loose bms  --Clostridium difficile infection 02/14/2021. - continue oral vancomycin- complete course 10 days (d/w ID pharmacist)   Hyperglycemia due to type 2 diabetes mellitus ([place]) -  SSI -- long-acting insulin along with sliding scale. Carb control diet.     Restless leg syndrome- chronic, stable on home medications - Continue ropinirole         Procedures: Diverticular abscess drainage per IR, s/p laparotomy with Hartmann's procedures Family communication :dter Brittany [name] Consults :surgery, IR CODE STATUS: full DVT Prophylaxis :lovenox Level of care: Med-Surg Status is: Inpatient  Remains inpatient appropriate because: postop day 2 anticipate discharge 1 to 2 days  TOTAL TIME TAKING CARE OF THIS PATIENT: 25 minutes.  >50% time spent on counselling and coordination of care  Note: This dictation was prepared with Dragon dictation along with smaller phrase technology. Any transcriptional errors that result from this process are unintentional.  Fritzi Mandes M.D    [facility]   CC: Primary care physician; Clinic, [facility] Patient ID: Brittany [name], female   DOB: [birth year], 61 y.o.   MRN:  [id number]
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
## 2021-03-13 ENCOUNTER — Ambulatory Visit: Payer: Medicare Other | Admitting: Surgery

## 2021-03-13 LAB — GLUCOSE, CAPILLARY
Glucose-Capillary: 530 mg/dL (ref 70–99)
Glucose-Capillary: 269 mg/dL — ABNORMAL HIGH (ref 70–99)
Glucose-Capillary: 224 mg/dL — ABNORMAL HIGH (ref 70–99)
Glucose-Capillary: 144 mg/dL — ABNORMAL HIGH (ref 70–99)
Glucose-Capillary: 226 mg/dL — ABNORMAL HIGH (ref 70–99)
Glucose-Capillary: 289 mg/dL — ABNORMAL HIGH (ref 70–99)

## 2021-03-13 LAB — BASIC METABOLIC PANEL
Anion gap: 8 (ref 5–15)
BUN: 14 mg/dL (ref 8–23)
CO2: 26 mmol/L (ref 22–32)
Calcium: 8.2 mg/dL — ABNORMAL LOW (ref 8.9–10.3)
Chloride: 102 mmol/L (ref 98–111)
Creatinine, Ser: 0.6 mg/dL (ref 0.44–1.00)
GFR, Estimated: >60 mL/min (ref >60)
Glucose, Bld: 256 mg/dL — ABNORMAL HIGH (ref 70–99)
Potassium: 4 mmol/L (ref 3.5–5.1)
Sodium: 136 mmol/L (ref 135–145)

## 2021-03-13 LAB — SURGICAL PATHOLOGY

## 2021-03-13 LAB — MAGNESIUM: Magnesium: 1.7 mg/dL (ref 1.7–2.4)

## 2021-03-13 MED ORDER — ALPRAZOLAM 0.25 MG PO TABS
0.2500 mg | ORAL_TABLET | ORAL | Status: AC
  Administered 2021-03-13: 17:00:00 0.25 mg via ORAL
  Filled 2021-03-13: qty 1

## 2021-03-13 MED ORDER — INSULIN GLARGINE-YFGN 100 UNIT/ML ~~LOC~~ SOLN
10.0000 [IU] | Freq: Two times a day (BID) | SUBCUTANEOUS | Status: DC
  Administered 2021-03-13 – 2021-03-14 (×2): 10 [IU] via SUBCUTANEOUS
  Filled 2021-03-13 (×3): qty 0.1

## 2021-03-13 NOTE — Progress Notes (Signed)
Patient's dressings changed by surgical PA.
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
## 2021-03-13 NOTE — Progress Notes (Signed)
[place] Hospital Day(s): 10.   Post op day(s): 3 Days Post-Op.   Interval History:  Patient seen and examined No acute events or new complaints overnight.  Patient doing well from surgical perspective She is very upset this morning due to loosing her jewelry No fever, chills, nausea, emesis  Labs are reassuring this morning Surgical drain with 50 ccs out; serosanguinous Colostomy output with 500 ccs recorded She is advanced to carb modified diet  Vital signs in last 24 hours: [min-max] current  Temp:  [97.9 F (36.6 C)-98.5 F (36.9 C)] 98.5 F (36.9 C) (01/30 0449) Pulse Rate:  [88-95] 95 (01/30 0449) Resp:  [16-18] 16 (01/30 0449) BP: (134-158)/(84-99) 158/99 (01/30 0449) SpO2:  [98 %-100 %] 99 % (01/30 0449)     Height: 5' (152.4 cm) Weight: 44 kg BMI (Calculated): 18.94   Intake/Output last 2 shifts:  01/29 0701 - 01/30 0700 In: -  Out: 550 [Drains:50; Stool:500]   Physical Exam:  Constitutional: alert, cooperative and no distress  Respiratory: breathing non-labored at rest  Cardiovascular: regular rate and sinus rhythm  Gastrointestinal: soft, non-tender, and non-distended, no rebound/guarding. Colostomy in the left mid-abdomen, pink, patent, gas and stool present. Surgical drain in the RLQ; serosanguinous (removed).  Integumentary: Midline wound healing via secondary intention. LLQ I&D site is healing well, no purulence, no erythema   Midline wound (03/13/2021):     LLQ Wound (03/13/2021):     Labs:  CBC Latest Ref Rng & Units 03/10/2021 03/08/2021 03/07/2021  WBC 4.0 - 10.5 K/uL 6.2 10.3 8.5  Hemoglobin 12.0 - 15.0 g/dL 9.6(L) 9.8(L) 10.0(L)  Hematocrit 36.0 - 46.0 % 29.3(L) 29.6(L) 29.5(L)  Platelets 150 - 400 K/uL 731(H) 687(H) 646(H)   CMP Latest Ref Rng & Units 03/12/2021 03/10/2021 03/09/2021  Glucose 70 - 99 mg/dL 104(H) - -  BUN 8 - 23 mg/dL 13 - -  Creatinine 0.44 - 1.00 mg/dL 0.75 - -  Sodium 135 - 145  mmol/L 136 - -  Potassium 3.5 - 5.1 mmol/L 3.8 3.1(L) 2.9(L)  Chloride 98 - 111 mmol/L 101 - -  CO2 22 - 32 mmol/L 27 - -  Calcium 8.9 - 10.3 mg/dL 8.5(L) - -  Total Protein 6.5 - 8.1 g/dL - - -  Total Bilirubin 0.3 - 1.2 mg/dL - - -  Alkaline Phos 38 - 126 U/L - - -  AST 15 - 41 U/L - - -  ALT 0 - 44 U/L - - -    Imaging studies: No new pertinent imaging studies   Assessment/Plan:  61 y.o. female 3 Days Post-Op s/p Hartamnn's for complex and recurrent diverticulitis with abscess which appears to now be draining from left groin   - Continue diet as tolerated - Continue local wound care at home - Midline wound: Pack daily with wet to dry kerlix or gauze, cover,                            Secure. Change PRN as well.  - LLQ wound: Pack daily with packing, cover, Secure. Change PRN             as well.   - Surgical drain removed   - Pain control prn; antiemetics prn   - Mobilization as tolerated - Arrange home health - Further management per primary service  - Discharge Planning; Okay for DC from surgical standpoint, wound care preformed today and orders  for home written, drain removed, she can follow up in the office in 2 weeks    All of the above findings and recommendations were discussed with the patient, and the medical team, and all of patient's questions were answered to her expressed satisfaction.  -- Edison Simon, PA-C [facility] 03/13/2021, 7:28 AM [phone] M-F: 7am - 4pm

## 2021-03-13 NOTE — Discharge Instructions (Signed)
In addition to included general post-operative instructions,  Diet: Resume home diet.   Activity: No heavy lifting >20 pounds (children, pets, laundry, garbage) or strenuous activity for 6 weeks, but light activity and walking are encouraged. Do not drive or drink alcohol if taking narcotic pain medications or having pain that might distract from driving.  Wound care:  -- Midline Wound; Pack midline wound daily with saline moistened gauze (or Kerlix), cover with dry gauze/ABD pad, and secure.  -- LLQ Wound: Pack wound daily with packing strips; cover, secure  Dressings can be changed as needed as well Dressings can be removed to shower. Replace dressing after. No baths/bathing/swimming, do not submerge wounds.   Medications: Resume all home medications. For mild to moderate pain: acetaminophen (Tylenol) or ibuprofen/naproxen (if no kidney disease). Combining Tylenol with alcohol can substantially increase your risk of causing liver disease. Narcotic pain medications, if prescribed, can be used for severe pain, though may cause nausea, constipation, and drowsiness. Do not combine Tylenol and Percocet (or similar) within a 6 hour period as Percocet (and similar) contain(s) Tylenol. If you do not need the narcotic pain medication, you do not need to fill the prescription.  Call office [phone] / [phone]) at any time if any questions, worsening pain, fevers/chills, bleeding, drainage from incision site, or other concerns.

## 2021-03-13 NOTE — Consult Note (Signed)
PHARMACY CONSULT NOTE - FOLLOW UP  Pharmacy Consult for Electrolyte Monitoring and Replacement   Recent Labs: Potassium (mmol/L)  Date Value  03/13/2021 4.0  08/17/2011 3.6   Magnesium (mg/dL)  Date Value  03/13/2021 1.7   Calcium (mg/dL)  Date Value  03/13/2021 8.2 (L)   Calcium, Total (mg/dL)  Date Value  08/17/2011 9.7   Albumin (g/dL)  Date Value  03/08/2021 1.9 (L)  04/04/2011 4.5   Phosphorus (mg/dL)  Date Value  03/10/2021 3.8   Sodium (mmol/L)  Date Value  03/13/2021 136  08/17/2011 139    Assessment: Patient admitted for colonic diverticular abscess. Abx and aspiration completed. Surgical intervention not planned fr now. Patient on liquid dit and oral supplement. Noted multifactorial diarrhea and low K. Pharmacy consulted for electrolytes replacement.  Goal of Therapy:  Electrolytes within normal range  Plan:  No need for repletion today, electrolytes WNL F/u with AM labs.   Darrick Penna, PharmD, MS PGPM Clinical Pharmacist 03/13/2021 8:33 AM

## 2021-03-13 NOTE — Progress Notes (Signed)
Received call from NT stating patient was becoming combative and in the hallway. Upon entry into patient's room patient noted to be seated and crying profusely. Patient states, "I don't know what I did for everyone to hate me." Patient reassured no one disliked her and were here purely to give her the care she needed. Patient continues to cry and expresses she feels like "no one asks her what she thinks." Patient expresses feeling depressed and like "nothing will get better."  Sat down with patient while she continued to express her frustrations and concerns about going home. Patient then exclaims, "I just need someone to hug and love me and tell me I'm worth it." Patient's feelings validated.  MD made aware and order for prn obtained and given.  Patient assisted to get cleaned up, order additional food for dinner and empty ostomy.  Patient was able to empty ostomy into toilet with minimal assist for dexterity purposes.  Patient remains tearful, although she states she is "feeling somewhat better."

## 2021-03-13 NOTE — Consult Note (Signed)
[place] Nurse ostomy follow up Patient receiving care in [facility]. Patient stated she wants to take care of the ostomy herself, and not get her daughter, Brittany [name], involved if possible. Stoma type/location: LUQ colostomy Stomal assessment/size: 1 3/4 inches, round, moist, red Peristomal assessment: intact Treatment options for stomal/peristomal skin: barrier ring Output: small amount of thin green in existing pouch Ostomy pouching: 2pc. 2 1/4 inch system. Patient uses the following ostomy supplies: Pouch, Kellie Simmering #[id number]; skin barrier, Kellie Simmering [phone]; barrier rings, Kellie Simmering [phone] Education provided: Patient immediately began to cry when I told her I was there to help her learn how to the her ostomy care.  She also shared she has had a stroke in the past.  She requested and received pain med at the beginning of the pouch removal and change process.  The patient was able to remove the existing pouch from the skin barrier.  She was able to remove the existing skin barrier.  She observed me cleaning the peristomal skin. She participating cutting the new skin barrier to a 1 3/4 inch opening. She participated in applying the barrier ring to the back of the skin barrier. She attempted to snap the pouch onto the skin barrier, but was unsuccessful until I assisted her by starting the joining of the two.  She successfully closed the pouch. She verbalized that the pouch needs to be changed twice a week, and emptied in between. I have requested 5 of each ostomy pouching item to be ordered from [employer] and placed in her room.  She is to take these when she is discharged. Enrolled patient in [place] Discharge program: Yes, today.  We went over the importance of following the [employer] instructions so she can continue to get supplies when needed.  It would be very helpful for her to have a few home health visits when discharged, and to have a referral to the [facility].  Val Riles, RN, MSN, CWOCN, CNS-BC, pager [phone]

## 2021-03-13 NOTE — Progress Notes (Signed)
[place] [facility] [place] NAME: Sariyah Corcino    MR#:  [id number]  DATE OF BIRTH:  04/20/1960  SUBJECTIVE:  dressing changes done by surgery. Drain removed. No fever. Tolerating PO diet. Ostomy education done by wound nurse today REVIEW OF SYSTEMS:   Review of Systems  Constitutional:  Negative for chills, fever and weight loss.  HENT:  Negative for ear discharge, ear pain and nosebleeds.   Eyes:  Negative for blurred vision, pain and discharge.  Respiratory:  Negative for sputum production, shortness of breath, wheezing and stridor.   Cardiovascular:  Negative for chest pain, palpitations, orthopnea and PND.  Gastrointestinal:  Negative for abdominal pain, diarrhea, nausea and vomiting.  Genitourinary:  Negative for frequency and urgency.  Musculoskeletal:  Negative for back pain and joint pain.  Neurological:  Positive for weakness. Negative for sensory change, speech change and focal weakness.  Psychiatric/Behavioral:  Negative for depression and hallucinations. The patient is not nervous/anxious.    DRUG ALLERGIES:   Allergies  Allergen Reactions   Cephalosporins Itching    TOLERATED ZOSYN (PIPERACILLIN) BEFORE   Hydromorphone Itching   Keflin [Cephalothin] Itching   Lactose Intolerance (Gi) Diarrhea    VITALS:  Blood pressure (!) 146/95, pulse (!) 101, temperature 98.7 F (37.1 C), temperature source Oral, resp. rate 16, height 5' (1.524 m), weight 44 kg, SpO2 98 %.  PHYSICAL EXAMINATION:   Physical Exam  GENERAL:  61 y.o.-year-old patient lying in the bed with no acute distress.thin fraile  HEENT: Head atraumatic, normocephalic. Oropharynx and nasopharynx clear.  LUNGS: Normal breath sounds bilaterally, no wheezing, rales, rhonchi.  CARDIOVASCULAR: S1, S2 normal. No murmurs, rubs, or gallops.  ABDOMEN: Hartmans pouch + EXTREMITIES: No  edema b/l.    NEUROLOGIC: nonfocal PSYCHIATRIC:  patient is alert and awake SKIN:      LABORATORY PANEL:  CBC Recent Labs  Lab 03/10/21 0707  WBC 6.2  HGB 9.6*  HCT 29.3*  PLT 731*     Chemistries  Recent Labs  Lab 03/08/21 0455 03/09/21 0424 03/13/21 0618  NA 134*   < > 136  K 2.9*   < > 4.0  CL 99   < > 102  CO2 28   < > 26  GLUCOSE 327*   < > 256*  BUN <5*   < > 14  CREATININE 0.56   < > 0.60  CALCIUM 7.9*   < > 8.2*  MG 1.6*   < > 1.7  AST 12*  --   --   ALT 9  --   --   ALKPHOS 89  --   --   BILITOT 0.4  --   --    < > = values in this interval not displayed.    Cardiac Enzymes No results for input(s): TROPONINI in the last 168 hours. RADIOLOGY:  No results found. ASSESSMENT AND PLAN:  MAKENZIE WEISNER is a 61 y.o. 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  C. difficile PCR positive on 02/14/2021 treated with vancomycin. pt presented from home to the ED on 03/02/2021 with LLQ pain associated with N/V and diarrhea x several days  Colonic diverticular abscess, recurrent- s/p  superficial abscess aspiration 1/20. I&D 1/22. Cultures showing e coli with sensitivities to zosyn. Patient has cephalosporin allergy. Afebrile about 24 hours. Incision from I&D without erythema or drainage. Iodoform packing in place. repeat abdominal CT 1/22 not showing significant improvement. - general surgery following--pt did not want surgery today -IV Zosyn/vanc for total 10 days, pain control, IV antiemetics -- repeat CT scan 1/26-- shows no improvement in diverticular disease and continues to have intramural abscess. -- Surgery has spoken with patient and her daughter. Patient is now in agreement for surgery which is scheduled for  tomorrow --1/27--POD 0  --Laparotomy w Hartmann's procedure,Takedown of splenic flexure And  I/D of Left complex inguinal abscess --1/28-- POD 1-- tolerating PO diet well. Ostomy output good. No fever. Sugars a bit high. Additional insulin given --1/29--POD 2-- ostomy output good. No fever. Sugars much better. Wound consult for ostomy education. Spoke with daughter [name] who is very well aware of the ostomy since she used to help her father also do ostomy care. --1/30--Ostomy education done by wound nurse. Dressing change done by [facility] surgery PA. Drain removed. -- Patient will discharged tomorrow with home health RN for dressing changes.  Hypokalemia Repleted   IPMN (intraductal papillary mucinous neoplasm) s/p partial pancreatectomy and splenectomy- contributing to chronic diarrhea - Continue Pancrease at increased dose   Multifactorial diarrhea- Suspect persistent C. difficile infection --Clostridium difficile infection 02/14/2021. --pt now has loose stools in her ostomy (brown)   Hyperglycemia due to type 2 diabetes mellitus ([place]) -  SSI -- long-acting insulin along with sliding scale. Carb control diet.     Restless leg syndrome- chronic, stable on home medications - Continue ropinirole      Procedures: Diverticular abscess drainage per IR, s/p laparotomy with Hartmann's procedures Family communication :dter ericka Consults :surgery, IR CODE STATUS: full DVT Prophylaxis :lovenox Level of care: Med-Surg Status is: Inpatient  Remains inpatient appropriate because anticipate discharge 1/31 TOTAL TIME TAKING CARE OF THIS PATIENT: 25 minutes.  >50% time spent on counselling and coordination of care  Note: This dictation was prepared with Dragon dictation along with smaller phrase technology. Any transcriptional errors that result from this process are unintentional.  Fritzi Mandes M.D    [facility]   CC: Primary care physician; Clinic, [facility] Patient ID:  AKYLA VAVREK, female   DOB: [birth year], 61 y.o.   MRN: [id number]

## 2021-03-14 LAB — BASIC METABOLIC PANEL
Anion gap: 5 (ref 5–15)
BUN: 9 mg/dL (ref 8–23)
CO2: 27 mmol/L (ref 22–32)
Calcium: 8.1 mg/dL — ABNORMAL LOW (ref 8.9–10.3)
Chloride: 105 mmol/L (ref 98–111)
Creatinine, Ser: 0.44 mg/dL (ref 0.44–1.00)
GFR, Estimated: >60 mL/min (ref >60)
Glucose, Bld: 91 mg/dL (ref 70–99)
Potassium: 3.3 mmol/L — ABNORMAL LOW (ref 3.5–5.1)
Sodium: 137 mmol/L (ref 135–145)

## 2021-03-14 LAB — MAGNESIUM: Magnesium: 1.6 mg/dL — ABNORMAL LOW (ref 1.7–2.4)

## 2021-03-14 LAB — GLUCOSE, CAPILLARY: Glucose-Capillary: 100 mg/dL — ABNORMAL HIGH (ref 70–99)

## 2021-03-14 MED ORDER — POTASSIUM CHLORIDE CRYS ER 20 MEQ PO TBCR: 40.0000 meq | EXTENDED_RELEASE_TABLET | Freq: Once | ORAL | Status: DC

## 2021-03-14 MED ORDER — GABAPENTIN 50 MG PO TABS: 300.0000 mg | ORAL_TABLET | Freq: Three times a day (TID) | ORAL | 0 refills | Status: DC

## 2021-03-14 MED ORDER — OXYCODONE HCL 5 MG PO TABS: 5.0000 mg | ORAL_TABLET | Freq: Two times a day (BID) | ORAL | 0 refills | Status: DC | PRN

## 2021-03-14 MED ORDER — MAGNESIUM SULFATE 4 GM/100ML IV SOLN
4.0000 g | Freq: Once | INTRAVENOUS | Status: AC
  Administered 2021-03-14: 10:00:00 4 g via INTRAVENOUS
  Filled 2021-03-14: qty 100

## 2021-03-14 MED ORDER — ZINC OXIDE 40 % EX OINT: TOPICAL_OINTMENT | CUTANEOUS | 0 refills | Status: DC | PRN

## 2021-03-14 MED ORDER — POTASSIUM CHLORIDE CRYS ER 20 MEQ PO TBCR: 20.0000 meq | EXTENDED_RELEASE_TABLET | Freq: Every day | ORAL | 0 refills | Status: DC

## 2021-03-14 MED ORDER — POTASSIUM CHLORIDE CRYS ER 20 MEQ PO TBCR
20.0000 meq | EXTENDED_RELEASE_TABLET | Freq: Once | ORAL | Status: AC
  Administered 2021-03-14: 20 meq via ORAL
  Filled 2021-03-14: qty 1

## 2021-03-14 MED ORDER — ATORVASTATIN CALCIUM 40 MG PO TABS: 40.0000 mg | ORAL_TABLET | Freq: Every evening | ORAL | 1 refills | Status: DC

## 2021-03-14 NOTE — Discharge Summary (Signed)
[place] [facility] [place] NAME: Brittany [name]    MR#:  [id number]  DATE OF BIRTH:  April 01, 1960  DATE OF ADMISSION:  03/02/2021 ADMITTING PHYSICIAN: Athena Masse, MD  DATE OF DISCHARGE: 03/14/2021  PRIMARY CARE PHYSICIAN: Clinic, [facility]    ADMISSION DIAGNOSIS:  Diverticulitis [K57.92] Generalized abdominal pain [R10.84] Colonic diverticular abscess [K57.20]  DISCHARGE DIAGNOSIS:  Recurrent diverticulitis with diverticular abscess status post surgery , Hartman's pouch  SECONDARY DIAGNOSIS:   Past Medical History:  Diagnosis Date   Cancer ([place])    Collagen vascular disease ([place])    Diabetes mellitus without complication ([place])    Hypertension    Lupus [facility])     HOSPITAL COURSE:  Brittany [name] is a 61 y.o. 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  C. difficile PCR positive on 02/14/2021 treated with vancomycin. pt presented from home to the ED on 03/02/2021 with LLQ pain associated with N/V and diarrhea x several days   Colonic diverticular abscess, recurrent- s/p superficial abscess aspiration 1/20. I&D 1/22. Cultures showing e coli with sensitivities to zosyn. Patient has cephalosporin allergy. Afebrile about 24 hours. Incision from I&D without erythema or drainage. Iodoform packing in place. repeat abdominal CT 1/22 not showing significant improvement. - general surgery following--pt did not want surgery today -IV Zosyn/vanc for total 10 days, pain control, IV antiemetics -- repeat CT scan 1/26-- shows no improvement in diverticular disease and continues to have intramural abscess. -- Surgery  has spoken with patient and her daughter. Patient is now in agreement for surgery which is scheduled for tomorrow --1/27--POD 0  --Laparotomy w Hartmann's procedure,Takedown of splenic flexure And  I/D of Left complex inguinal abscess --1/28-- POD 1-- tolerating PO diet well. Ostomy output good. No fever. Sugars a bit high. Additional insulin given --1/29--POD 2-- ostomy output good. No fever. Sugars much better. Wound consult for ostomy education. Spoke with daughter [name] who is very well aware of the ostomy since she used to help her father also do ostomy care. --1/30--Ostomy education done by wound nurse. Dressing change done by [facility] surgery PA. Drain removed. -- 1/31--per RN dressing change done. Wound is stable.Patient will discharged with home health RN for dressing changes.   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 difficile infection --Clostridium difficile infection 02/14/2021. --pt now has loose stools in her ostomy (brown) --pt completed 2nd round of 10 days po vanc (during hospital stay)   Hyperglycemia due to type 2 diabetes mellitus ([place]) -  SSI -- long-acting insulin along with sliding scale. Carb control diet.     Restless leg syndrome- chronic, stable on home medications - Continue ropinirole      Procedures: Diverticular abscess drainage per IR, s/p laparotomy with Hartmann's procedures Family communication :dter ericka Consults :surgery, IR CODE STATUS: full DVT Prophylaxis :lovenox Level of care: Med-Surg Status is: Inpatient  Pt overall best at baseline for discharge to home. HH for dressing changes  CONSULTS OBTAINED:    DRUG ALLERGIES:   Allergies  Allergen Reactions  Cephalosporins Itching    TOLERATED ZOSYN (PIPERACILLIN) BEFORE   Hydromorphone Itching    Keflin [Cephalothin] Itching   Lactose Intolerance (Gi) Diarrhea    DISCHARGE MEDICATIONS:   Allergies as of 03/14/2021       Reactions   Cephalosporins Itching   TOLERATED ZOSYN (PIPERACILLIN) BEFORE   Hydromorphone Itching   Keflin [cephalothin] Itching   Lactose Intolerance (gi) Diarrhea        Medication List     STOP taking these medications    amoxicillin-clavulanate 875-125 MG tablet Commonly known as: AUGMENTIN   aspirin 81 MG chewable tablet   Lantus SoloStar 100 UNIT/ML Solostar Pen Generic drug: insulin glargine   vancomycin 125 MG capsule Commonly known as: VANCOCIN       TAKE these medications    atorvastatin 40 MG tablet Commonly known as: LIPITOR Take 1 tablet (40 mg total) by mouth Nightly. What changed:  medication strength how much to take   benazepril 10 MG tablet Commonly known as: LOTENSIN Take 10 mg by mouth daily.   diclofenac Sodium 1 % Gel Commonly known as: VOLTAREN Apply 2 g topically 4 (four) times daily.   feeding supplement (GLUCERNA SHAKE) Liqd Take 237 mLs by mouth 3 (three) times daily between meals.   Gabapentin 50 MG Tabs Take 300 mg by mouth 3 (three) times daily. What changed:  medication strength how much to take when to take this   liver oil-zinc oxide 40 % ointment Commonly known as: DESITIN Apply topically as needed for irritation.   mirtazapine 15 MG tablet Commonly known as: REMERON Take 1 tablet (15 mg total) by mouth at bedtime.   NovoLIN 70/30 (70-30) 100 UNIT/ML injection Generic drug: insulin NPH-regular Human Inject 28 Units into the skin 2 (two) times daily with a meal. What changed:  how much to take when to take this   ondansetron 4 MG disintegrating tablet Commonly known as: ZOFRAN-ODT Take 4 mg by mouth every 8 (eight) hours as needed.   oxyCODONE 5 MG immediate release tablet Commonly known as: Oxy IR/ROXICODONE Take 1 tablet (5 mg total) by mouth every 12 (twelve) hours as  needed for moderate pain or severe pain.   Pancrelipase (Lip-Prot-Amyl) 24000-76000 units Cpep Take 3 capsules (72,000 Units total) by mouth 3 (three) times daily. What changed: additional instructions   potassium chloride SA 20 MEQ tablet Commonly known as: KLOR-CON M Take 1 tablet (20 mEq total) by mouth daily. What changed: how much to take   rOPINIRole 1 MG tablet Commonly known as: REQUIP Take 1 tablet (1 mg total) by mouth 2 (two) times daily. What changed:  how much to take when to take this               Discharge Care Instructions  (From admission, onward)           Start     Ordered   03/14/21 0000  Discharge wound care:       Comments: Wound care  2 times daily      Comments: Pack left groin, 1/2 inch packing BID, Please also change midline laparotomy dressing w kerlix daily.  03/10/21 1233   03/14/21 0816            If you experience worsening of your admission symptoms, develop shortness of breath, life threatening emergency, suicidal or homicidal thoughts you must seek medical attention immediately by calling 911 or calling your MD immediately  if symptoms less severe.  You Must read complete  instructions/literature along with all the possible adverse reactions/side effects for all the Medicines you take and that have been prescribed to you. Take any new Medicines after you have completely understood and accept all the possible adverse reactions/side effects.   Please note  You were cared for by a hospitalist during your hospital stay. If you have any questions about your discharge medications or the care you received while you were in the hospital after you are discharged, you can call the unit and asked to speak with the hospitalist on call if the hospitalist that took care of you is not available. Once you are discharged, your primary care physician will handle any further medical issues. Please note that NO REFILLS for any discharge medications  will be authorized once you are discharged, as it is imperative that you return to your primary care physician (or establish a relationship with a primary care physician if you do not have one) for your aftercare needs so that they can reassess your need for medications and monitor your lab values. Today   SUBJECTIVE   A bit anxious this morning After talking with her and explaining she is feeling ok Dressing change done per RN  VITAL SIGNS:  Blood pressure (!) 158/97, pulse (!) 105, temperature 98.2 F (36.8 C), temperature source Oral, resp. rate 18, height 5' (1.524 m), weight 44 kg, SpO2 96 %.  I/O:   Intake/Output Summary (Last 24 hours) at 03/14/2021 0820 Last data filed at 03/13/2021 1800 Gross per 24 hour  Intake --  Output 300 ml  Net -300 ml    PHYSICAL EXAMINATION:  GENERAL:  61 y.o.-year-old patient lying in the bed with no acute distress.thin fraile  HEENT: Head atraumatic, normocephalic. Oropharynx and nasopharynx clear.  LUNGS: Normal breath sounds bilaterally, no wheezing, rales, rhonchi.  CARDIOVASCULAR: S1, S2 normal. No murmurs, rubs, or gallops.  ABDOMEN: Hartmans pouch + PSYCHIATRIC:  patient is alert and awake SKIN:     DATA REVIEW:   CBC  Recent Labs  Lab 03/10/21 0707  WBC 6.2  HGB 9.6*  HCT 29.3*  PLT 731*    Chemistries  Recent Labs  Lab 03/08/21 0455 03/09/21 0424 03/14/21 0541  NA 134*   < > 137  K 2.9*   < > 3.3*  CL 99   < > 105  CO2 28   < > 27  GLUCOSE 327*   < > 91  BUN <5*   < > 9  CREATININE 0.56   < > 0.44  CALCIUM 7.9*   < > 8.1*  MG 1.6*   < > 1.6*  AST 12*  --   --   ALT 9  --   --   ALKPHOS 89  --   --   BILITOT 0.4  --   --    < > = values in this interval not displayed.    Microbiology Results   Recent Results (from the past 240 hour(s))  Aerobic/Anaerobic Culture w Gram Stain (surgical/deep wound)     Status: None   Collection Time: 03/05/21  4:45 PM   Specimen: Abdomen; Abscess  Result Value Ref Range  Status   Specimen Description   Final    ABDOMEN Performed at [facility], [address] [place],  [address]    Special Requests NONE  Final   Gram Stain   Final    FEW WBC PRESENT, PREDOMINANTLY PMN NO ORGANISMS SEEN    Culture   Final  RARE KLEBSIELLA PNEUMONIAE RARE LACTOBACILLUS SPECIES Standardized susceptibility testing for this organism is not available. NO ANAEROBES ISOLATED Performed at [facility], [place] [address], [place], [place] [address]    Report Status 03/11/2021 FINAL  Final   Organism ID, Bacteria KLEBSIELLA PNEUMONIAE  Final      Susceptibility   Klebsiella pneumoniae - MIC*    AMPICILLIN >=32 RESISTANT Resistant     CEFAZOLIN <=4 SENSITIVE Sensitive     CEFEPIME <=0.12 SENSITIVE Sensitive     CEFTAZIDIME <=1 SENSITIVE Sensitive     CEFTRIAXONE <=0.25 SENSITIVE Sensitive     CIPROFLOXACIN <=0.25 SENSITIVE Sensitive     GENTAMICIN <=1 SENSITIVE Sensitive     IMIPENEM <=0.25 SENSITIVE Sensitive     TRIMETH/SULFA <=20 SENSITIVE Sensitive     AMPICILLIN/SULBACTAM 8 SENSITIVE Sensitive     PIP/TAZO <=4 SENSITIVE Sensitive     * RARE KLEBSIELLA PNEUMONIAE    RADIOLOGY:  No results found.   CODE STATUS:     Code Status Orders  (From admission, onward)           Start     Ordered   03/03/21 0146  Full code  Continuous        03/03/21 0146           Code Status History     Date Active Date Inactive Code Status Order ID Comments User Context   02/14/2021 1153 02/17/2021 2031 Full Code [id number]  Wyvonnia Dusky, MD ED   12/25/2020 0834 12/27/2020 1915 Full Code [id number]  Collier Bullock, MD ED   11/18/2020 0633 11/19/2020 2153 Full Code [id number]  Mansy, Arvella Merles, MD ED   11/04/2020 2150 11/08/2020 1952 Full Code [id number]  Mansy, Arvella Merles, MD ED   07/20/2019 1317 08/04/2019 1640 Full Code [id number]  Bary Leriche, PA-C Inpatient   07/12/2019 2247 07/20/2019 1309 Full Code [id number]  Athena Masse, MD ED   05/28/2019 1811  05/29/2019 1931 Full Code [id number]  Jennye Boroughs, MD ED   10/17/2018 1722 10/19/2018 1601 Full Code [id number]  Lang Snow, NP ED        TOTAL TIME TAKING CARE OF THIS PATIENT: 40 minutes.    Fritzi Mandes M.D  [facility]    CC: Primary care physician; Clinic, [facility]

## 2021-03-14 NOTE — Consult Note (Addendum)
PHARMACY CONSULT NOTE - FOLLOW UP  Pharmacy Consult for Electrolyte Monitoring and Replacement   Recent Labs: Potassium (mmol/L)  Date Value  03/14/2021 3.3 (L)  08/17/2011 3.6   Magnesium (mg/dL)  Date Value  03/14/2021 1.6 (L)   Calcium (mg/dL)  Date Value  03/14/2021 8.1 (L)   Calcium, Total (mg/dL)  Date Value  08/17/2011 9.7   Albumin (g/dL)  Date Value  03/08/2021 1.9 (L)  04/04/2011 4.5   Phosphorus (mg/dL)  Date Value  03/10/2021 3.8   Sodium (mmol/L)  Date Value  03/14/2021 137  08/17/2011 139    Assessment: Patient admitted for colonic diverticular abscess. Abx and aspiration completed. Surgical intervention not planned fr now. Patient on liquid dit and oral supplement. Noted multifactorial diarrhea and low K. Pharmacy consulted for electrolytes replacement.  Goal of Therapy:  Electrolytes within normal range  Plan:  Give magnesium sulfate 4gm IV x1  Give potassium chloride 20 meq PO x1 F/u with AM labs.   Darrick Penna, PharmD, MS PGPM Clinical Pharmacist 03/14/2021 8:14 AM

## 2021-03-14 NOTE — Care Management Important Message (Addendum)
Important Message  Patient Details  Name: Brittany [name] MRN: [id number] Date of Birth: 05-11-1960   Medicare Important Message Given:  Yes  Patient is in an isolation room so I reviewed her Important Message from Medicare with her by phone [phone]). She stated she understood her rights but was tearful as she wasn't sure how she was going to get transported home.  I let her know that I would contact the care manager so they could coordinate transportation.  She thanked me and I wished her a speedy recovery.   I also, talked with the pt's. daughter, Brittany [name] by phone [phone]) and she was in agreement with the discharge. I thanked her for her time.   Juliann Pulse A Livana Yerian 03/14/2021, 10:23 AM

## 2021-03-14 NOTE — Plan of Care (Signed)

## 2021-03-14 NOTE — TOC Progression Note (Signed)
Transition of Care [facility]) - Progression Note    Patient Details  Name: Brittany [name] MRN: [id number] Date of Birth: January 12, 1961  Transition of Care [facility]) CM/SW [place], RN Phone Number: 03/14/2021, 9:35 AM  Clinical Narrative:   Patient will be discharged with [facility] as per [name], nursing will care for ostomy and wound.  As per [name], they will be unable to respond for 3 days, but advise patient and daughter of her cell number if they need assistance.  [phone] provided to daughter.   As per Ostomy nurse, Lenore Manner, full ostomy teaching was given yesterday.        Expected Discharge Plan: [place] Barriers to Discharge: Barriers Resolved  Expected Discharge Plan and Services Expected Discharge Plan: [place] arrangements for the past 2 months: Single Family Home Expected Discharge Date: 03/14/21                         HH Arranged: RN [place] Agency: [place] Date [place]: 03/14/21   Representative spoke with at [place]: E. [place] ([place]) Interventions    Readmission Risk Interventions Readmission Risk Prevention Plan 03/06/2021 02/16/2021  Transportation Screening Complete Complete  Medication Review Press photographer) Complete Complete  PCP or Specialist appointment within 3-5 days of discharge Complete -  SW Recovery Care/Counseling Consult - Complete  Palliative Care Screening Not Applicable Not [place] Not Applicable Not Applicable  Some recent data might be hidden

## 2021-03-14 NOTE — Progress Notes (Signed)
Chaplain On-Call responded to Spiritual Care Consult Order from RN Tiffanie Quentin [place].  The request was for spiritual support for the patient after her recent surgery to receive a colostomy, and other health needs.  Chaplain met the patient, who was speaking on the phone. The patient requested a visit later this afternoon.  This Chaplain will notify the next Chaplain On-Call to follow up.  Chaplain Pollyann Samples M.Div., [name]

## 2021-03-19 NOTE — Progress Notes (Addendum)
TOC follow up- Patient was not readmitted   CSW received call from ED nurse reporting call from patient stating they are running out of ostomy bags and never received Home Health services upon discharge. CSW spoke with Brittany [name] from [facility]. Brittany [name] reported not being able to contact the patient or collateral to obtain consent to began services. Brittany [name] attempted to reach out to the patient again today with no response.   Brittany [name] reported not having any ostomy bags in stock and asked CSW to see what agency does. CSW called Brittany [name] from [employer] to see if they had any ostomy bags in stock and was told she would ask the department who facilitates ostomy bags within the agency. No updates from [place] yet.

## 2021-03-19 NOTE — ED Notes (Signed)
Received call in the ED from patient regarding ostomy bags,  she was a inpatient and d/c 1/31 from room 103.  Told patient I would contact TOC to help her.  Contacted ED TOC [place] and she stated she would pass on the [facility] that covered 1C  1212

## 2021-03-28 ENCOUNTER — Other Ambulatory Visit: Payer: Self-pay

## 2021-03-28 ENCOUNTER — Encounter: Payer: Self-pay | Admitting: Physician Assistant

## 2021-03-28 ENCOUNTER — Ambulatory Visit (INDEPENDENT_AMBULATORY_CARE_PROVIDER_SITE_OTHER): Payer: Medicare Other | Admitting: Physician Assistant

## 2021-03-28 VITALS — BP 111/81 | HR 93 | Temp 98.1°F | Ht 60.0 in | Wt 90.2 lb

## 2021-03-28 DIAGNOSIS — Z09 Encounter for follow-up examination after completed treatment for conditions other than malignant neoplasm: Secondary | ICD-10-CM

## 2021-03-28 DIAGNOSIS — K5720 Diverticulitis of large intestine with perforation and abscess without bleeding: Secondary | ICD-10-CM

## 2021-03-28 MED ORDER — OXYCODONE HCL 5 MG PO TABS: 5.0000 mg | ORAL_TABLET | Freq: Two times a day (BID) | ORAL | 0 refills | Status: DC | PRN

## 2021-03-28 NOTE — Patient Instructions (Addendum)
You may eat what ever you like. Try to increase your protein intake. Protein shakes are good for inbetween meals.   If you have any concerns or questions please feel free to call our office. See follow up appointment below.

## 2021-03-28 NOTE — Progress Notes (Signed)
[facility] POST-OP OFFICE VISIT  03/28/2021  HPI: Brittany [name] is a 61 y.o. female 18 days s/p exploratory laparotomy, Hartmann's, and I&D of complex LLQ abscess with Dr Dahlia Byes.   She is doing well; anxious and tearful Pain is doing reasonably well with tylenol/motrin, wants something stronger to help with sleep No fever, chills, nausea, emesis States she is eating well Dressings being changed by daughter and RN, doing well Ostomy working   Vital signs: BP 111/81    Pulse 93    Temp 98.1 F (36.7 C)    Ht 5' (1.524 m)    Wt 90 lb 3.2 oz (40.9 kg)    SpO2 100%    BMI 17.62 kg/m    Physical Exam: Constitutional: Well appearing female, NAD Abdomen: Soft, non-tender, non-distended, no rebound/guarding, colostomy in the left abdomen is protruding appropriately, pink, healthy, gas and small amount of stool in bag.  Skin: midline laparotomy healing via secondary intention, wound bed is smaller, 100% granulation tissue. There is a minimal amount of fibrinous exudate which wipes away. No erythema, LLQ I&D site healing via secondary intention as well, wound bed is granulation tissue, no evidence of residual abscess.   Assessment/Plan: This is a 61 y.o. female 18 days s/p exploratory laparotomy, Hartmann's, and I&D of complex LLQ abscess with Dr Dahlia Byes.    - Pain control prn; refilled oxycodone; continue Tylenol/Motrin  - Reviewed wound care recommendation; continue superficial dressing and LLQ packing; wounds healing well  - Reviewed lifting restrictions; 6 weeks total  - I will have her follow up in ~1 month for reassessment  -- Edison Simon, PA-C [facility] 03/28/2021, 2:33 PM [phone] M-F: 7am - 4pm

## 2021-03-29 ENCOUNTER — Encounter: Payer: Self-pay | Admitting: Physician Assistant

## 2021-04-03 ENCOUNTER — Encounter: Payer: Medicare Other | Admitting: Surgery

## 2021-04-05 ENCOUNTER — Emergency Department: Payer: Medicare Other

## 2021-04-05 ENCOUNTER — Inpatient Hospital Stay: Payer: Medicare Other

## 2021-04-05 ENCOUNTER — Inpatient Hospital Stay (HOSPITAL_COMMUNITY)
Admission: AD | Admit: 2021-04-05 | Discharge: 2021-05-09 | DRG: 056 | Disposition: A | Discharge status: Skilled Nursing Facility | Payer: Medicare Other | Source: Other Acute Inpatient Hospital | Attending: Internal Medicine | Admitting: Internal Medicine

## 2021-04-05 ENCOUNTER — Inpatient Hospital Stay
Admission: EM | Admit: 2021-04-05 | Discharge: 2021-04-05 | DRG: 637 | Disposition: A | Discharge status: Other Acute Inpatient Hospital | Payer: Medicare Other | Attending: Internal Medicine | Admitting: Internal Medicine

## 2021-04-05 ENCOUNTER — Other Ambulatory Visit: Payer: Self-pay

## 2021-04-05 DIAGNOSIS — F419 Anxiety disorder, unspecified: Secondary | ICD-10-CM | POA: Diagnosis present

## 2021-04-05 DIAGNOSIS — K219 Gastro-esophageal reflux disease without esophagitis: Secondary | ICD-10-CM | POA: Diagnosis present

## 2021-04-05 DIAGNOSIS — Z933 Colostomy status: Secondary | ICD-10-CM

## 2021-04-05 DIAGNOSIS — J449 Chronic obstructive pulmonary disease, unspecified: Secondary | ICD-10-CM | POA: Diagnosis present

## 2021-04-05 DIAGNOSIS — Z8507 Personal history of malignant neoplasm of pancreas: Secondary | ICD-10-CM

## 2021-04-05 DIAGNOSIS — E1140 Type 2 diabetes mellitus with diabetic neuropathy, unspecified: Secondary | ICD-10-CM | POA: Diagnosis present

## 2021-04-05 DIAGNOSIS — Z794 Long term (current) use of insulin: Secondary | ICD-10-CM

## 2021-04-05 DIAGNOSIS — J189 Pneumonia, unspecified organism: Secondary | ICD-10-CM | POA: Diagnosis not present

## 2021-04-05 DIAGNOSIS — U071 COVID-19: Secondary | ICD-10-CM | POA: Diagnosis present

## 2021-04-05 DIAGNOSIS — D72829 Elevated white blood cell count, unspecified: Secondary | ICD-10-CM | POA: Diagnosis present

## 2021-04-05 DIAGNOSIS — I248 Other forms of acute ischemic heart disease: Secondary | ICD-10-CM | POA: Diagnosis present

## 2021-04-05 DIAGNOSIS — R451 Restlessness and agitation: Secondary | ICD-10-CM | POA: Diagnosis not present

## 2021-04-05 DIAGNOSIS — R4182 Altered mental status, unspecified: Secondary | ICD-10-CM

## 2021-04-05 DIAGNOSIS — J690 Pneumonitis due to inhalation of food and vomit: Secondary | ICD-10-CM | POA: Diagnosis not present

## 2021-04-05 DIAGNOSIS — R0602 Shortness of breath: Secondary | ICD-10-CM

## 2021-04-05 DIAGNOSIS — J9601 Acute respiratory failure with hypoxia: Secondary | ICD-10-CM | POA: Diagnosis not present

## 2021-04-05 DIAGNOSIS — E119 Type 2 diabetes mellitus without complications: Secondary | ICD-10-CM | POA: Diagnosis present

## 2021-04-05 DIAGNOSIS — W06XXXA Fall from bed, initial encounter: Secondary | ICD-10-CM | POA: Diagnosis present

## 2021-04-05 DIAGNOSIS — Z8673 Personal history of transient ischemic attack (TIA), and cerebral infarction without residual deficits: Secondary | ICD-10-CM | POA: Diagnosis not present

## 2021-04-05 DIAGNOSIS — E875 Hyperkalemia: Secondary | ICD-10-CM | POA: Diagnosis not present

## 2021-04-05 DIAGNOSIS — E785 Hyperlipidemia, unspecified: Secondary | ICD-10-CM | POA: Diagnosis present

## 2021-04-05 DIAGNOSIS — Z9081 Acquired absence of spleen: Secondary | ICD-10-CM

## 2021-04-05 DIAGNOSIS — E43 Unspecified severe protein-calorie malnutrition: Secondary | ICD-10-CM | POA: Diagnosis present

## 2021-04-05 DIAGNOSIS — R1310 Dysphagia, unspecified: Secondary | ICD-10-CM | POA: Diagnosis not present

## 2021-04-05 DIAGNOSIS — R569 Unspecified convulsions: Secondary | ICD-10-CM | POA: Diagnosis present

## 2021-04-05 DIAGNOSIS — Z8616 Personal history of COVID-19: Secondary | ICD-10-CM | POA: Diagnosis not present

## 2021-04-05 DIAGNOSIS — E7220 Disorder of urea cycle metabolism, unspecified: Secondary | ICD-10-CM | POA: Diagnosis present

## 2021-04-05 DIAGNOSIS — K529 Noninfective gastroenteritis and colitis, unspecified: Secondary | ICD-10-CM | POA: Diagnosis present

## 2021-04-05 DIAGNOSIS — R4701 Aphasia: Secondary | ICD-10-CM | POA: Diagnosis present

## 2021-04-05 DIAGNOSIS — I69351 Hemiplegia and hemiparesis following cerebral infarction affecting right dominant side: Secondary | ICD-10-CM

## 2021-04-05 DIAGNOSIS — L8990 Pressure ulcer of unspecified site, unspecified stage: Secondary | ICD-10-CM | POA: Insufficient documentation

## 2021-04-05 DIAGNOSIS — F339 Major depressive disorder, recurrent, unspecified: Secondary | ICD-10-CM | POA: Diagnosis present

## 2021-04-05 DIAGNOSIS — E891 Postprocedural hypoinsulinemia: Secondary | ICD-10-CM | POA: Diagnosis not present

## 2021-04-05 DIAGNOSIS — F4381 Prolonged grief disorder: Secondary | ICD-10-CM | POA: Diagnosis present

## 2021-04-05 DIAGNOSIS — F17210 Nicotine dependence, cigarettes, uncomplicated: Secondary | ICD-10-CM | POA: Diagnosis present

## 2021-04-05 DIAGNOSIS — R7989 Other specified abnormal findings of blood chemistry: Secondary | ICD-10-CM | POA: Diagnosis present

## 2021-04-05 DIAGNOSIS — E139 Other specified diabetes mellitus without complications: Secondary | ICD-10-CM

## 2021-04-05 DIAGNOSIS — D75838 Other thrombocytosis: Secondary | ICD-10-CM | POA: Diagnosis not present

## 2021-04-05 DIAGNOSIS — G2581 Restless legs syndrome: Secondary | ICD-10-CM | POA: Diagnosis present

## 2021-04-05 DIAGNOSIS — I10 Essential (primary) hypertension: Secondary | ICD-10-CM | POA: Diagnosis not present

## 2021-04-05 DIAGNOSIS — E876 Hypokalemia: Secondary | ICD-10-CM | POA: Diagnosis present

## 2021-04-05 DIAGNOSIS — E1110 Type 2 diabetes mellitus with ketoacidosis without coma: Principal | ICD-10-CM | POA: Diagnosis present

## 2021-04-05 DIAGNOSIS — N390 Urinary tract infection, site not specified: Secondary | ICD-10-CM | POA: Diagnosis present

## 2021-04-05 DIAGNOSIS — F418 Other specified anxiety disorders: Secondary | ICD-10-CM | POA: Diagnosis not present

## 2021-04-05 DIAGNOSIS — R778 Other specified abnormalities of plasma proteins: Secondary | ICD-10-CM | POA: Diagnosis not present

## 2021-04-05 DIAGNOSIS — E739 Lactose intolerance, unspecified: Secondary | ICD-10-CM | POA: Diagnosis present

## 2021-04-05 DIAGNOSIS — D638 Anemia in other chronic diseases classified elsewhere: Secondary | ICD-10-CM | POA: Diagnosis present

## 2021-04-05 DIAGNOSIS — Z681 Body mass index (BMI) 19 or less, adult: Secondary | ICD-10-CM | POA: Diagnosis not present

## 2021-04-05 DIAGNOSIS — R4781 Slurred speech: Secondary | ICD-10-CM | POA: Diagnosis present

## 2021-04-05 DIAGNOSIS — J1282 Pneumonia due to coronavirus disease 2019: Secondary | ICD-10-CM | POA: Diagnosis present

## 2021-04-05 DIAGNOSIS — I69398 Other sequelae of cerebral infarction: Secondary | ICD-10-CM | POA: Diagnosis not present

## 2021-04-05 DIAGNOSIS — G40909 Epilepsy, unspecified, not intractable, without status epilepticus: Secondary | ICD-10-CM

## 2021-04-05 DIAGNOSIS — Z79899 Other long term (current) drug therapy: Secondary | ICD-10-CM

## 2021-04-05 DIAGNOSIS — Z9114 Patient's other noncompliance with medication regimen: Secondary | ICD-10-CM

## 2021-04-05 DIAGNOSIS — L930 Discoid lupus erythematosus: Secondary | ICD-10-CM | POA: Diagnosis present

## 2021-04-05 DIAGNOSIS — E440 Moderate protein-calorie malnutrition: Secondary | ICD-10-CM | POA: Diagnosis present

## 2021-04-05 DIAGNOSIS — E11649 Type 2 diabetes mellitus with hypoglycemia without coma: Secondary | ICD-10-CM | POA: Diagnosis not present

## 2021-04-05 DIAGNOSIS — G9341 Metabolic encephalopathy: Secondary | ICD-10-CM | POA: Diagnosis present

## 2021-04-05 DIAGNOSIS — E1169 Type 2 diabetes mellitus with other specified complication: Secondary | ICD-10-CM | POA: Diagnosis present

## 2021-04-05 DIAGNOSIS — Z91119 Patient's noncompliance with dietary regimen due to unspecified reason: Secondary | ICD-10-CM

## 2021-04-05 DIAGNOSIS — Z7189 Other specified counseling: Secondary | ICD-10-CM | POA: Diagnosis not present

## 2021-04-05 DIAGNOSIS — Z881 Allergy status to other antibiotic agents status: Secondary | ICD-10-CM | POA: Diagnosis not present

## 2021-04-05 DIAGNOSIS — Z515 Encounter for palliative care: Secondary | ICD-10-CM | POA: Diagnosis not present

## 2021-04-05 DIAGNOSIS — F05 Delirium due to known physiological condition: Secondary | ICD-10-CM | POA: Diagnosis not present

## 2021-04-05 DIAGNOSIS — Z885 Allergy status to narcotic agent status: Secondary | ICD-10-CM

## 2021-04-05 DIAGNOSIS — A419 Sepsis, unspecified organism: Secondary | ICD-10-CM | POA: Diagnosis not present

## 2021-04-05 DIAGNOSIS — L304 Erythema intertrigo: Secondary | ICD-10-CM | POA: Diagnosis present

## 2021-04-05 DIAGNOSIS — Z818 Family history of other mental and behavioral disorders: Secondary | ICD-10-CM

## 2021-04-05 DIAGNOSIS — Z751 Person awaiting admission to adequate facility elsewhere: Secondary | ICD-10-CM

## 2021-04-05 DIAGNOSIS — F32A Depression, unspecified: Secondary | ICD-10-CM | POA: Diagnosis present

## 2021-04-05 DIAGNOSIS — Z9049 Acquired absence of other specified parts of digestive tract: Secondary | ICD-10-CM

## 2021-04-05 DIAGNOSIS — G4089 Other seizures: Secondary | ICD-10-CM | POA: Diagnosis present

## 2021-04-05 DIAGNOSIS — Z803 Family history of malignant neoplasm of breast: Secondary | ICD-10-CM

## 2021-04-05 DIAGNOSIS — Z4659 Encounter for fitting and adjustment of other gastrointestinal appliance and device: Secondary | ICD-10-CM

## 2021-04-05 DIAGNOSIS — G4700 Insomnia, unspecified: Secondary | ICD-10-CM | POA: Diagnosis present

## 2021-04-05 DIAGNOSIS — R6520 Severe sepsis without septic shock: Secondary | ICD-10-CM | POA: Diagnosis not present

## 2021-04-05 DIAGNOSIS — R4189 Other symptoms and signs involving cognitive functions and awareness: Secondary | ICD-10-CM | POA: Diagnosis present

## 2021-04-05 DIAGNOSIS — Z90411 Acquired partial absence of pancreas: Secondary | ICD-10-CM | POA: Diagnosis not present

## 2021-04-05 DIAGNOSIS — Z720 Tobacco use: Secondary | ICD-10-CM | POA: Diagnosis present

## 2021-04-05 DIAGNOSIS — T4275XA Adverse effect of unspecified antiepileptic and sedative-hypnotic drugs, initial encounter: Secondary | ICD-10-CM | POA: Diagnosis not present

## 2021-04-05 DIAGNOSIS — Z781 Physical restraint status: Secondary | ICD-10-CM

## 2021-04-05 DIAGNOSIS — G9340 Encephalopathy, unspecified: Secondary | ICD-10-CM | POA: Diagnosis not present

## 2021-04-05 LAB — COMPREHENSIVE METABOLIC PANEL
ALT: 51 U/L — ABNORMAL HIGH (ref 0–44)
AST: 38 U/L (ref 15–41)
Albumin: 3.3 g/dL — ABNORMAL LOW (ref 3.5–5.0)
Alkaline Phosphatase: 176 U/L — ABNORMAL HIGH (ref 38–126)
Anion gap: 22 — ABNORMAL HIGH (ref 5–15)
BUN: 12 mg/dL (ref 8–23)
CO2: 18 mmol/L — ABNORMAL LOW (ref 22–32)
Calcium: 8.5 mg/dL — ABNORMAL LOW (ref 8.9–10.3)
Chloride: 96 mmol/L — ABNORMAL LOW (ref 98–111)
Creatinine, Ser: 0.94 mg/dL (ref 0.44–1.00)
GFR, Estimated: >60 mL/min (ref >60)
Glucose, Bld: 460 mg/dL — ABNORMAL HIGH (ref 70–99)
Potassium: 3.4 mmol/L — ABNORMAL LOW (ref 3.5–5.1)
Sodium: 136 mmol/L (ref 135–145)
Total Bilirubin: 0.9 mg/dL (ref 0.3–1.2)
Total Protein: 8.6 g/dL — ABNORMAL HIGH (ref 6.5–8.1)

## 2021-04-05 LAB — CK: Total CK: 212 U/L (ref 38–234)

## 2021-04-05 LAB — URINALYSIS, COMPLETE (UACMP) WITH MICROSCOPIC
Bilirubin Urine: NEGATIVE
Glucose, UA: >=500 mg/dL — AB
Ketones, ur: 80 mg/dL — AB
Leukocytes,Ua: NEGATIVE
Nitrite: NEGATIVE
Protein, ur: 30 mg/dL — AB
Specific Gravity, Urine: 1.022 (ref 1.005–1.030)
pH: 5 (ref 5.0–8.0)

## 2021-04-05 LAB — BASIC METABOLIC PANEL
Anion gap: 22 — ABNORMAL HIGH (ref 5–15)
BUN: 12 mg/dL (ref 8–23)
CO2: 17 mmol/L — ABNORMAL LOW (ref 22–32)
Calcium: 8.3 mg/dL — ABNORMAL LOW (ref 8.9–10.3)
Chloride: 99 mmol/L (ref 98–111)
Creatinine, Ser: 0.8 mg/dL (ref 0.44–1.00)
GFR, Estimated: >60 mL/min (ref >60)
Glucose, Bld: 438 mg/dL — ABNORMAL HIGH (ref 70–99)
Potassium: 3.4 mmol/L — ABNORMAL LOW (ref 3.5–5.1)
Sodium: 138 mmol/L (ref 135–145)

## 2021-04-05 LAB — RESP PANEL BY RT-PCR (FLU A&B, COVID) ARPGX2
Influenza A by PCR: NEGATIVE
Influenza B by PCR: NEGATIVE
SARS Coronavirus 2 by RT PCR: POSITIVE — AB

## 2021-04-05 LAB — BLOOD GAS, VENOUS
Acid-base deficit: 4.5 mmol/L — ABNORMAL HIGH (ref 0.0–2.0)
Bicarbonate: 20.3 mmol/L (ref 20.0–28.0)
O2 Saturation: 81.5 %
Patient temperature: 37
pCO2, Ven: 36 mmHg — ABNORMAL LOW (ref 44–60)
pH, Ven: 7.36 (ref 7.25–7.43)
pO2, Ven: 52 mmHg — ABNORMAL HIGH (ref 32–45)

## 2021-04-05 LAB — CBC
HCT: 40.2 % (ref 36.0–46.0)
Hemoglobin: 12 g/dL (ref 12.0–15.0)
MCH: 28.9 pg (ref 26.0–34.0)
MCHC: 29.9 g/dL — ABNORMAL LOW (ref 30.0–36.0)
MCV: 96.9 fL (ref 80.0–100.0)
Platelets: 451 K/uL — ABNORMAL HIGH (ref 150–400)
RBC: 4.15 MIL/uL (ref 3.87–5.11)
RDW: 16.3 % — ABNORMAL HIGH (ref 11.5–15.5)
WBC: 10.9 K/uL — ABNORMAL HIGH (ref 4.0–10.5)
nRBC: 0 % (ref 0.0–0.2)

## 2021-04-05 LAB — URINE DRUG SCREEN, QUALITATIVE (ARMC ONLY)
Amphetamines, Ur Screen: NOT DETECTED
Barbiturates, Ur Screen: NOT DETECTED
Benzodiazepine, Ur Scrn: POSITIVE — AB
Cannabinoid 50 Ng, Ur ~~LOC~~: POSITIVE — AB
Cocaine Metabolite,Ur ~~LOC~~: NOT DETECTED
MDMA (Ecstasy)Ur Screen: NOT DETECTED
Methadone Scn, Ur: NOT DETECTED
Opiate, Ur Screen: NOT DETECTED
Phencyclidine (PCP) Ur S: NOT DETECTED
Tricyclic, Ur Screen: NOT DETECTED

## 2021-04-05 LAB — MAGNESIUM: Magnesium: 1.6 mg/dL — ABNORMAL LOW (ref 1.7–2.4)

## 2021-04-05 LAB — PROTIME-INR
INR: 1 (ref 0.8–1.2)
Prothrombin Time: 13.4 s (ref 11.4–15.2)

## 2021-04-05 LAB — TROPONIN I (HIGH SENSITIVITY)
Troponin I (High Sensitivity): 21 ng/L — ABNORMAL HIGH
Troponin I (High Sensitivity): 22 ng/L — ABNORMAL HIGH (ref <18)

## 2021-04-05 LAB — CBG MONITORING, ED: Glucose-Capillary: 431 mg/dL — ABNORMAL HIGH (ref 70–99)

## 2021-04-05 LAB — BETA-HYDROXYBUTYRIC ACID: Beta-Hydroxybutyric Acid: 7.77 mmol/L — ABNORMAL HIGH (ref 0.05–0.27)

## 2021-04-05 MED ORDER — SODIUM CHLORIDE 0.9 % IV SOLN
2000.0000 mg | Freq: Once | INTRAVENOUS | Status: DC
  Filled 2021-04-05: qty 20

## 2021-04-05 MED ORDER — NICOTINE 21 MG/24HR TD PT24
21.0000 mg | MEDICATED_PATCH | Freq: Every day | TRANSDERMAL | Status: DC
Start: 2021-04-05 — End: 2021-04-05

## 2021-04-05 MED ORDER — GABAPENTIN 300 MG PO CAPS: 300.0000 mg | ORAL_CAPSULE | Freq: Three times a day (TID) | ORAL | Status: DC

## 2021-04-05 MED ORDER — DEXTROSE 50 % IV SOLN: 0.0000 mL | INTRAVENOUS | Status: DC | PRN

## 2021-04-05 MED ORDER — DM-GUAIFENESIN ER 30-600 MG PO TB12
1.0000 | ORAL_TABLET | Freq: Two times a day (BID) | ORAL | Status: DC | PRN
Start: 2021-04-05 — End: 2021-04-05

## 2021-04-05 MED ORDER — MAGNESIUM SULFATE 2 GM/50ML IV SOLN: 2.0000 g | Freq: Once | INTRAVENOUS | Status: DC

## 2021-04-05 MED ORDER — ATORVASTATIN CALCIUM 20 MG PO TABS: 40.0000 mg | ORAL_TABLET | Freq: Every evening | ORAL | Status: DC

## 2021-04-05 MED ORDER — BENAZEPRIL HCL 20 MG PO TABS: 10.0000 mg | ORAL_TABLET | Freq: Every day | ORAL | Status: DC

## 2021-04-05 MED ORDER — ACETAMINOPHEN 325 MG PO TABS
650.0000 mg | ORAL_TABLET | Freq: Four times a day (QID) | ORAL | Status: DC | PRN
  Administered 2021-04-06 – 2021-05-09 (×27): 650 mg via ORAL
  Filled 2021-04-05 (×28): qty 2

## 2021-04-05 MED ORDER — LEVETIRACETAM IN NACL 500 MG/100ML IV SOLN
500.0000 mg | Freq: Two times a day (BID) | INTRAVENOUS | Status: DC
  Filled 2021-04-05: qty 100

## 2021-04-05 MED ORDER — ROPINIROLE HCL 0.25 MG PO TABS
0.5000 mg | ORAL_TABLET | Freq: Every day | ORAL | Status: DC
  Filled 2021-04-05: qty 2

## 2021-04-05 MED ORDER — LORAZEPAM 2 MG/ML IJ SOLN
1.0000 mg | INTRAMUSCULAR | Status: DC | PRN
  Administered 2021-04-05 (×2): 1 mg via INTRAVENOUS
  Filled 2021-04-05 (×2): qty 1

## 2021-04-05 MED ORDER — DEXTROSE 50 % IV SOLN
0.0000 mL | INTRAVENOUS | Status: DC | PRN
  Administered 2021-04-14 – 2021-04-17 (×2): 50 mL via INTRAVENOUS
  Administered 2021-04-19: 25 mL via INTRAVENOUS
  Filled 2021-04-05 (×5): qty 50

## 2021-04-05 MED ORDER — POTASSIUM CHLORIDE 10 MEQ/100ML IV SOLN
10.0000 meq | INTRAVENOUS | Status: AC
  Administered 2021-04-05 (×2): 10 meq via INTRAVENOUS
  Filled 2021-04-05 (×3): qty 100

## 2021-04-05 MED ORDER — SODIUM CHLORIDE 0.9 % IV BOLUS
1000.0000 mL | Freq: Once | INTRAVENOUS | Status: AC
  Administered 2021-04-05: 1000 mL via INTRAVENOUS

## 2021-04-05 MED ORDER — DEXTROSE IN LACTATED RINGERS 5 % IV SOLN: INTRAVENOUS | Status: DC

## 2021-04-05 MED ORDER — HYDRALAZINE HCL 20 MG/ML IJ SOLN: 5.0000 mg | INTRAMUSCULAR | Status: DC | PRN

## 2021-04-05 MED ORDER — ZINC OXIDE 40 % EX OINT
TOPICAL_OINTMENT | CUTANEOUS | Status: DC | PRN
  Filled 2021-04-05: qty 113

## 2021-04-05 MED ORDER — ENOXAPARIN SODIUM 300 MG/3ML IJ SOLN
20.0000 mg | INTRAMUSCULAR | Status: DC
  Administered 2021-04-06 – 2021-05-09 (×34): 20 mg via SUBCUTANEOUS
  Filled 2021-04-05 (×35): qty 0.2

## 2021-04-05 MED ORDER — OXYCODONE HCL 5 MG PO TABS: 5.0000 mg | ORAL_TABLET | Freq: Two times a day (BID) | ORAL | Status: DC | PRN

## 2021-04-05 MED ORDER — ALBUTEROL SULFATE (2.5 MG/3ML) 0.083% IN NEBU: 2.5000 mg | INHALATION_SOLUTION | RESPIRATORY_TRACT | Status: DC | PRN

## 2021-04-05 MED ORDER — ACETAMINOPHEN 325 MG RE SUPP: 650.0000 mg | Freq: Four times a day (QID) | RECTAL | Status: DC | PRN

## 2021-04-05 MED ORDER — INSULIN REGULAR(HUMAN) IN NACL 100-0.9 UT/100ML-% IV SOLN: INTRAVENOUS | Status: DC

## 2021-04-05 MED ORDER — LACTATED RINGERS IV SOLN: INTRAVENOUS | Status: DC

## 2021-04-05 MED ORDER — BENAZEPRIL HCL 10 MG PO TABS
10.0000 mg | ORAL_TABLET | Freq: Every day | ORAL | Status: DC
  Filled 2021-04-05: qty 1

## 2021-04-05 MED ORDER — INSULIN REGULAR(HUMAN) IN NACL 100-0.9 UT/100ML-% IV SOLN
INTRAVENOUS | Status: DC
  Administered 2021-04-06: 8 [IU]/h via INTRAVENOUS
  Filled 2021-04-05: qty 100

## 2021-04-05 MED ORDER — ACETAMINOPHEN 650 MG RE SUPP
650.0000 mg | Freq: Four times a day (QID) | RECTAL | Status: DC | PRN
  Administered 2021-05-03: 650 mg via RECTAL
  Filled 2021-04-05: qty 1

## 2021-04-05 MED ORDER — ASPIRIN EC 81 MG PO TBEC: 81.0000 mg | DELAYED_RELEASE_TABLET | Freq: Every day | ORAL | Status: DC

## 2021-04-05 MED ORDER — GLUCERNA SHAKE PO LIQD: 237.0000 mL | Freq: Three times a day (TID) | ORAL | Status: DC

## 2021-04-05 MED ORDER — ATORVASTATIN CALCIUM 40 MG PO TABS
40.0000 mg | ORAL_TABLET | Freq: Every day | ORAL | Status: DC
  Administered 2021-04-06: 40 mg via ORAL
  Filled 2021-04-05: qty 1

## 2021-04-05 MED ORDER — INSULIN ASPART 100 UNIT/ML IJ SOLN
10.0000 [IU] | Freq: Once | INTRAMUSCULAR | Status: AC
  Administered 2021-04-05: 10 [IU] via INTRAVENOUS
  Filled 2021-04-05: qty 1

## 2021-04-05 MED ORDER — PANCRELIPASE (LIP-PROT-AMYL) 12000-38000 UNITS PO CPEP
72000.0000 [IU] | ORAL_CAPSULE | Freq: Three times a day (TID) | ORAL | Status: DC
  Filled 2021-04-05 (×2): qty 6

## 2021-04-05 MED ORDER — PANCRELIPASE (LIP-PROT-AMYL) 36000-114000 UNITS PO CPEP
72000.0000 [IU] | ORAL_CAPSULE | Freq: Three times a day (TID) | ORAL | Status: DC
  Administered 2021-04-10 – 2021-05-09 (×79): 72000 [IU] via ORAL
  Filled 2021-04-05 (×103): qty 2

## 2021-04-05 MED ORDER — LEVETIRACETAM 500 MG/5ML IV SOLN: 2000.0000 mg | Freq: Two times a day (BID) | INTRAVENOUS | Status: DC

## 2021-04-05 MED ORDER — ACETAMINOPHEN 325 MG PO TABS: 650.0000 mg | ORAL_TABLET | Freq: Four times a day (QID) | ORAL | Status: DC | PRN

## 2021-04-05 MED ORDER — POLYETHYLENE GLYCOL 3350 17 G PO PACK: 17.0000 g | PACK | Freq: Every day | ORAL | Status: DC | PRN

## 2021-04-05 MED ORDER — MIRTAZAPINE 15 MG PO TABS: 15.0000 mg | ORAL_TABLET | Freq: Every day | ORAL | Status: DC

## 2021-04-05 MED ORDER — ENOXAPARIN SODIUM 30 MG/0.3ML IJ SOSY: 30.0000 mg | PREFILLED_SYRINGE | Freq: Every day | INTRAMUSCULAR | Status: DC

## 2021-04-05 NOTE — ED Notes (Signed)
Pt cleaned up; new colostomy bag applied; new brief, chucks pad, blankets given to pt.

## 2021-04-05 NOTE — ED Notes (Signed)
MD at BS

## 2021-04-05 NOTE — ED Notes (Signed)
Pt awake now & talking. Pt trying to get OOB. MD repositioned pt for comfort, but reinforced to pt that she cannot get OOB at this time. Bed low & locked; call light within reach.

## 2021-04-05 NOTE — ED Provider Notes (Signed)
Vitals:   04/05/21 1535 04/05/21 1605  BP: (!) 149/90 (!) 160/101  Pulse: 85   Resp: 19 17  Temp:    SpO2: 97%      Was called into his see and evaluate the patient she evidently fell out of bed.  Patient tells me that she seems somewhat confused, but reports that she was rolling to her side and rolled out of the bed.  She was found on the ground alert by nursing staff.  She denies any pain or injury.  She reports she did not hit her head, but given her clinical presentation and the fall out of the bed I think that her reliability as a historian is poor.  I discussed with the hospitalist, have ordered CT head and cervical spine.  Dr. Blaine Hamper and charge RN Macario Golds) in ED also made aware.  Patient currently admitted to hospitalist service.   Delman Kitten, MD 04/05/21 [phone]

## 2021-04-05 NOTE — ED Triage Notes (Signed)
Pt here via [facility] from home with seizures. Pt had 3 seizures today with left sided gaze. Pt given 4mg  versed intranasally by ems. PT unresponsive on arrival. Family unsure of pt was taking seizure medication even though bottles were found in the home by ems staff. EMS also describes house and unpresentable despite having family to care for pt. 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 Pt unresponsive but stable on arrival to ED.

## 2021-04-05 NOTE — Consult Note (Addendum)
Neurology Consultation Reason for Consult: Seizure Referring Physician: Mora Bellman  CC: confusion  History is obtained from: Chart review  HPI: Brittany [name] is a 61 y.o. female with a history of diabetes, hypertension, lupus, previous PCA stroke who presents with confusion.  Unclear exactly when she was last normal, but she was found confused, and EMS reports they witnessed what could be a partial seizure.  She was given Versed en route to hospital.  Since arrival, she has been persistently confused, per nursing she has not been able to understand much of what she has said.  An EEG was obtained which demonstrates LPD's with a brief ictal discharge.  Neurology has been consulted.   LKW: Unclear tpa given?: no, unclear time of onset   ROS: Unable to obtain due to altered mental status.   Past Medical History:  Diagnosis Date   Cancer ([place])    Collagen vascular disease ([place])    Diabetes mellitus without complication ([place])    Hypertension    Lupus ([place])      Family History  Problem Relation Age of Onset   Anxiety disorder Sister    Breast cancer Maternal Aunt      Social History:  reports that she has been smoking cigarettes. She has been exposed to tobacco smoke. She has never used smokeless tobacco. She reports current alcohol use. She reports that she does not currently use drugs.   Exam: Current vital signs: BP (!) 149/90    Pulse 85    Temp 97.9 F (36.6 C) (Oral)    Resp 19    Ht 5' (1.524 m)    Wt 40.9 kg    SpO2 97%    BMI 17.61 kg/m  Vital signs in last 24 hours: Temp:  [97.9 F (36.6 C)] 97.9 F (36.6 C) (02/22 0900) Pulse Rate:  [85-99] 85 (02/22 1535) Resp:  [17-19] 19 (02/22 1535) BP: (129-149)/(90-100) 149/90 (02/22 1535) SpO2:  [97 %-100 %] 97 % (02/22 1535) Weight:  [40.9 kg] 40.9 kg (02/22 7564)   Physical Exam  Constitutional: Appears well-developed and well-nourished.  Psych: Affect appropriate to situation Eyes: No scleral injection HENT: No OP  obstruction MSK: no joint deformities.  Cardiovascular: Normal rate and regular rhythm.  Respiratory: Effort normal, non-labored breathing GI: Soft.  No distension. There is no tenderness.  Skin: WDI  Neuro: Mental Status: Patient is awake, she has a significant aphasia with perseveration.  My initial exam, I was unable to understand anything that she said, when I asked her to stick out her tongue she just opened her mouth, would not show thumbs to command.  On subsequent exam, she states "I want to sit up" and perseverates on this Cranial Nerves: II: She does not blink to threat from either side, pupils are unequal with right about 2 mm smaller than left, both are reactive III,IV, VI: She has a left gaze preference but does cross midline to the right briefly She has right facial weakness motor: She has weakness of the right arm and leg, does not cooperate with formal testing but she moves a considerably less than the left but does move voluntarily. Sensory: She response to noxious stimulation in all four extremities Cerebellar: She does not perform      I have reviewed labs in epic and the results pertinent to this consultation are: Sodium 138 Creatinine 0.94 Glucose 438 Calcium 8.5 Magnesium 1.6  I have reviewed the images obtained: CT head-large left posterior quadrant stroke  Impression:  61 year old female with a history of left posterior quadrant stroke who presents with new onset seizures.  I will load with Keppra, but given that a subclinical seizure was captured on EEG I would favor transfer to an institution that can do continuous monitoring to ensure treatment response.  I suspect her previous stroke is the likley etiology.   Recommendations: 1) Keppra 2 g x 1, then 500 mg twice daily 2) replete hypomagnesemia with 2 g magnesium 3) recommend transfer for continuous EEG 4) neurology will continue to follow  Roland Rack, MD [facility] [phone]  If 7pm- 7am, please page neurology on call as listed in [place].

## 2021-04-05 NOTE — ED Notes (Signed)
Pt transported to MRI via stretcher with MRI techs.

## 2021-04-05 NOTE — Consult Note (Signed)
Referring Physician:  No referring provider defined for this encounter.  Primary Physician:  Clinic, [facility]  Chief Complaint:  abnormal CT C spine  History of Present Illness: 04/05/2021 Brittany [name] is a 61 y.o. female who presents with the chief complaint of confusion.  She has prior history of stroke.  She is unable to provide any history.  She has not been at baseline.  She is unable to converse with me. Neurology is concerned about seizures, requesting transfer for continuous EEG.  On initial CT, there was concern for rotatory subluxation.  The patient does not report pain.  Review of Systems:  A 10 point review of systems is negative, except for the pertinent positives and negatives detailed in the HPI.  Past Medical History: Past Medical History:  Diagnosis Date   Cancer ([place])    Collagen vascular disease ([place])    Diabetes mellitus without complication ([place])    Hypertension    Lupus ([place])     Past Surgical History: Past Surgical History:  Procedure Laterality Date   COLECTOMY WITH COLOSTOMY CREATION/HARTMANN PROCEDURE N/A 03/10/2021   Procedure: COLECTOMY WITH COLOSTOMY CREATION/HARTMANN PROCEDURE;  Surgeon: Jules Husbands, MD;  Location: [facility] ORS;  Service: General;  Laterality: N/A;   PANCREATECTOMY     spleenectomy      Allergies: Allergies as of 04/05/2021 - Review Complete 04/05/2021  Allergen Reaction Noted   Cephalosporins Itching 10/29/2017   Hydromorphone Itching 10/29/2017   Keflin [cephalothin] Itching 10/29/2017   Lactose intolerance (gi) Diarrhea 02/17/2021    Medications:  Current Facility-Administered Medications:    acetaminophen (TYLENOL) suppository 650 mg, 650 mg, Rectal, Q6H PRN, Ivor Costa, MD   acetaminophen (TYLENOL) tablet 650 mg, 650 mg, Oral, Q6H PRN, Ivor Costa, MD   albuterol (PROVENTIL) (2.5 MG/3ML) 0.083% nebulizer solution 2.5 mg, 2.5 mg, Nebulization, Q4H PRN, Ivor Costa, MD   aspirin EC tablet 81 mg, 81 mg, Oral,  Daily, Ivor Costa, MD   atorvastatin (LIPITOR) tablet 40 mg, 40 mg, Oral, Nightly, Ivor Costa, MD   benazepril (LOTENSIN) tablet 10 mg, 10 mg, Oral, Daily, Ivor Costa, MD   dextromethorphan-guaiFENesin ([place] DM) 30-600 MG per 12 hr tablet 1 tablet, 1 tablet, Oral, BID PRN, Ivor Costa, MD   dextrose 5 % in lactated ringers infusion, , Intravenous, Continuous, Ivor Costa, MD   dextrose 50 % solution 0-50 mL, 0-50 mL, Intravenous, PRN, Ivor Costa, MD   enoxaparin (LOVENOX) injection 30 mg, 30 mg, Subcutaneous, QHS, Ivor Costa, MD   feeding supplement (GLUCERNA SHAKE) (GLUCERNA SHAKE) liquid 237 mL, 237 mL, Oral, TID BM, Ivor Costa, MD   gabapentin (NEURONTIN) capsule 300 mg, 300 mg, Oral, TID, Ivor Costa, MD   hydrALAZINE (APRESOLINE) injection 5 mg, 5 mg, Intravenous, Q2H PRN, Ivor Costa, MD   insulin regular, human (MYXREDLIN) 100 units/ 100 mL infusion, , Intravenous, Continuous, Ivor Costa, MD   lactated ringers infusion, , Intravenous, Continuous, Ivor Costa, MD   levETIRAcetam (KEPPRA) 2,000 mg in sodium chloride 0.9 % 250 mL IVPB, 2,000 mg, Intravenous, Once, Greta Doom, MD   Derrill Memo ON 04/06/2021] levETIRAcetam (KEPPRA) IVPB 500 mg/100 mL premix, 500 mg, Intravenous, Q12H, Kirkpatrick, Vida Roller, MD   lipase/protease/amylase (CREON) capsule 72,000 Units, 72,000 Units, Oral, TID, Ivor Costa, MD   liver oil-zinc oxide (DESITIN) 40 % ointment, , Topical, PRN, Ivor Costa, MD   LORazepam (ATIVAN) injection 1 mg, 1 mg, Intravenous, Q2H PRN, Ivor Costa, MD, 1 mg at 04/05/21 1810   magnesium sulfate  IVPB 2 g 50 mL, 2 g, Intravenous, Once, Ivor Costa, MD   mirtazapine (REMERON) tablet 15 mg, 15 mg, Oral, QHS, Ivor Costa, MD   nicotine (NICODERM CQ - dosed in mg/24 hours) patch 21 mg, 21 mg, Transdermal, Daily, Ivor Costa, MD   oxyCODONE (Oxy IR/ROXICODONE) immediate release tablet 5 mg, 5 mg, Oral, Q12H PRN, Ivor Costa, MD   rOPINIRole (REQUIP) tablet 0.5 mg, 0.5 mg, Oral, QHS, Ivor Costa, MD  Current Outpatient Medications:    atorvastatin (LIPITOR) 40 MG tablet, Take 1 tablet (40 mg total) by mouth Nightly., Disp: 30 tablet, Rfl: 1   benazepril (LOTENSIN) 10 MG tablet, Take 10 mg by mouth daily., Disp: , Rfl:    diclofenac Sodium (VOLTAREN) 1 % GEL, Apply 2 g topically 4 (four) times daily., Disp: , Rfl:    feeding supplement, GLUCERNA SHAKE, (GLUCERNA SHAKE) LIQD, Take 237 mLs by mouth 3 (three) times daily between meals., Disp: , Rfl: 0   gabapentin 50 MG TABS, Take 300 mg by mouth 3 (three) times daily., Disp: 20 tablet, Rfl: 0   insulin NPH-regular Human (NOVOLIN 70/30) (70-30) 100 UNIT/ML injection, Inject 28 Units into the skin 2 (two) times daily with a meal. (Patient taking differently: Inject 30 Units into the skin daily with breakfast.), Disp: 10 mL, Rfl: 1   liver oil-zinc oxide (DESITIN) 40 % ointment, Apply topically as needed for irritation., Disp: 56.7 g, Rfl: 0   mirtazapine (REMERON) 15 MG tablet, Take 1 tablet (15 mg total) by mouth at bedtime., Disp: 30 tablet, Rfl: 0   ondansetron (ZOFRAN-ODT) 4 MG disintegrating tablet, Take 4 mg by mouth every 8 (eight) hours as needed., Disp: , Rfl:    oxyCODONE (OXY IR/ROXICODONE) 5 MG immediate release tablet, Take 1 tablet (5 mg total) by mouth every 12 (twelve) hours as needed for moderate pain or severe pain., Disp: 15 tablet, Rfl: 0   Pancrelipase, Lip-Prot-Amyl, 24000-76000 units CPEP, Take 3 capsules (72,000 Units total) by mouth 3 (three) times daily. (Patient taking differently: Take 3 capsules by mouth 3 (three) times daily. Appears to be noncompliant), Disp: 270 capsule, Rfl: 0   potassium chloride SA (KLOR-CON M) 20 MEQ tablet, Take 1 tablet (20 mEq total) by mouth daily., Disp: 30 tablet, Rfl: 0   rOPINIRole (REQUIP) 1 MG tablet, Take 1 tablet (1 mg total) by mouth 2 (two) times daily. (Patient taking differently: Take 0.5 mg by mouth at bedtime.), Disp: 60 tablet, Rfl: 0   Social History: Social History    Tobacco Use   Smoking status: Some Days    Types: Cigarettes    Passive exposure: Past   Smokeless tobacco: Never  Substance Use Topics   Alcohol use: Yes   Drug use: Not Currently    Family Medical History: Family History  Problem Relation Age of Onset   Anxiety disorder Sister    Breast cancer Maternal Aunt     Physical Examination: Vitals:   04/05/21 1535 04/05/21 1605  BP: (!) 149/90 (!) 160/101  Pulse: 85   Resp: 19 17  Temp:    SpO2: 97%      General: Patient is thin and lays in bed.  Psychiatric: Patient is non-anxious.  Head:  Pupils equal, round, and reactive to light.  ENT:  Oral mucosa appears well hydrated.  Neck:   Supple.  Full range of motion.  Respiratory: Patient is breathing without any difficulty.  Extremities: No edema.  Vascular: Palpable pulses in dorsal pedal vessels.  Skin:   On exposed skin, there are no abnormal skin lesions.  NEUROLOGICAL:  General: In no acute distress.   Awake, alert, oriented to person. Speech is garbled.  Pupils equal round and reactive to light.  EOM show L preference. Facial tone shows diminished R facial tone.She does not protrude tongue. She will not cooperate with drift.  She moves L side well, but moves R less well.  She does not follow commands well.  Sensory exam is unreliable.  Reflexes 1+.  Gait untested.  Imaging: CT C spine 04/05/21 at 1900 IMPRESSION: 1. No acute osseous abnormality of the cervical spine. 2. Mild to moderate severity degenerative changes at the levels of C5-C6 and C6-C7. 3. Approximately 1 mm anterolisthesis of the C4 vertebral body on C5, with 1 mm to 2 mm retrolisthesis of C5 vertebral body on C6.     Electronically Signed   By: Virgina Norfolk M.D.   On: 04/05/2021 19:39  Please note that the C1-2 rotation noted on the prior CT scan is not seen.  CT Head 04/05/21 IMPRESSION: 1. No acute intracranial abnormality. 2. Chronic right frontal, left parietooccipital  and left cerebellar infarcts.     Electronically Signed   By: Virgina Norfolk M.D.   On: 04/05/2021 19:36 I have personally reviewed the images and agree with the above interpretation.  Labs: CBC Latest Ref Rng & Units 04/05/2021 03/10/2021 03/08/2021  WBC 4.0 - 10.5 K/uL 10.9(H) 6.2 10.3  Hemoglobin 12.0 - 15.0 g/dL 12.0 9.6(L) 9.8(L)  Hematocrit 36.0 - 46.0 % 40.2 29.3(L) 29.6(L)  Platelets 150 - 400 K/uL 451(H) 731(H) 687(H)       Assessment and Plan: Brittany [name] is a pleasant 61 y.o. female with rotatory subluxation that resolved on a repeat CT scan after a fall.  She does not show or report pain.  Thus, this is likely positional and requires no further workup.  - no precautions necessary - no further workup necessary - no follow-up necessary   Wiley Magan K. Izora Ribas MD, [facility]

## 2021-04-05 NOTE — ED Notes (Signed)
Call placed to [place] [facility] to report pt to [facility] ([phone]). No answer, voicemail left. Will retry at later time.

## 2021-04-05 NOTE — ED Notes (Signed)
This RN went to MRI to administer medication to calm pt for MRI.

## 2021-04-05 NOTE — ED Notes (Signed)
Secure msg sent to Dr. Blaine Hamper re: which fluids to run after the NS bolus, as there are 2 different ones ordered.

## 2021-04-05 NOTE — H&P (Addendum)
History and Physical    BLAKLEIGH STRAW QVZ:[id number] DOB: December 03, 1960 DOA: 04/05/2021  Referring MD/NP/PA:   PCP: [facility], [facility]   Patient coming from:  The patient is coming from home.    Chief Complaint: AMS  HPI: Brittany [name] is a 61 y.o. female with medical history significant of s/p of colostomy due to history of diverticulitis and abscess, s/p of pancreectomy and splenectomy, hypertension, hyperlipidemia, diabetes mellitus, COPD, stroke with right-sided weakness, GERD, depression with anxiety, discoid lupus erythematosus, tobacco abuse, DKA, who presents with altered mental status.  Per her daughter (I called her daughter by phone), at normal baseline, patient is oriented x 3. She has a history of stroke with right-sided weakness. Normally pt can use a cane to walk. This AM, pt was found to be on the floor with confusion. Pt has jerking and slurred speech per her daughter.   Per EDP, EMS reported that the house was extremely filthy. The patient appeared to be in a soiled diaper. Pt has an colostomy with no collection bag in place. Daughter did not know which medications the patient was on.  EMS states they witnessed what could be a partial seizure, and gave the patient a total of 4 mg of intranasal Versed in route to the hospital. When I saw pt in ED. Pt is confused, not orientated x3. She has right-sided weakness which is likely from previous stroke.  No active cough, respiratory distress noted.  No active nausea and vomiting noted.  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 Pt does not seem to have chest pain.  Not sure if patient has symptoms of UTI.  Her daughter is not sure if patient has a history of seizure.  Daughter states that she found a Keppra bottle which is about 62 years old, not sure if patient is taking his medications or not.   Data Reviewed and ED Course: pt was found to have Blood sugar 460, bicarbonate 18, anion gap 22, WBC  10.9, INR 1.0, troponin level 21, pending beta hydroxybutyric acid, pending urinalysis, positive COVID PCR, potassium 3.4, GFR>60, temperature normal, blood pressure 149/94, heart rate 99, RR 18, oxygen saturation 98% on room air.  ABG with pH 7.36, CO2 36, O2 52. Patient is admitted to stepdown as inpatient.  CT of head and C-spin 1. No acute intracranial abnormality. 2. Chronic small vessel ischemic disease and multiple chronic infarcts. 3. No acute fracture or evidence of traumatic listhesis of the cervical spine. 4. Rotatory subluxation at C1-2, which may be positional in the absence of a fixed torticollis.  EKG: I have personally reviewed. Poor policy of EKG strip, QTc 524, possible right axis deviation, seem to be sinus rhythm.  Review of Systems: Could not reviewed accurately due to altered mental status.   Allergy:  Allergies  Allergen Reactions   Cephalosporins Itching    TOLERATED ZOSYN (PIPERACILLIN) BEFORE   Hydromorphone Itching   Keflin [Cephalothin] Itching   Lactose Intolerance (Gi) Diarrhea    Past Medical History:  Diagnosis Date   Cancer ([place])    Collagen vascular disease ([place])    Diabetes mellitus without complication ([place])    Hypertension    Lupus ([place])     Past Surgical History:  Procedure Laterality Date   COLECTOMY WITH COLOSTOMY CREATION/HARTMANN PROCEDURE N/A 03/10/2021   Procedure: COLECTOMY WITH COLOSTOMY CREATION/HARTMANN PROCEDURE;  Surgeon: Jules Husbands, MD;  Location: [facility] ORS;  Service: General;  Laterality: N/A;   PANCREATECTOMY  spleenectomy      Social History:  reports that she has been smoking cigarettes. She has been exposed to tobacco smoke. She has never used smokeless tobacco. She reports current alcohol use. She reports that she does not currently use drugs.  Family History:  Family History  Problem Relation Age of Onset   Anxiety disorder Sister    Breast cancer Maternal Aunt      Prior to Admission medications    Medication Sig Start Date End Date Taking? Authorizing Provider  atorvastatin (LIPITOR) 40 MG tablet Take 1 tablet (40 mg total) by mouth Nightly. 03/14/21 04/13/21  Fritzi Mandes, MD  benazepril (LOTENSIN) 10 MG tablet Take 10 mg by mouth daily.    [provider]  diclofenac Sodium (VOLTAREN) 1 % GEL Apply 2 g topically 4 (four) times daily. 01/09/21   [provider]  feeding supplement, GLUCERNA SHAKE, (GLUCERNA SHAKE) LIQD Take 237 mLs by mouth 3 (three) times daily between meals. 12/27/20   Loletha Grayer, MD  gabapentin 50 MG TABS Take 300 mg by mouth 3 (three) times daily. 03/14/21   Fritzi Mandes, MD  insulin NPH-regular Human (NOVOLIN 70/30) (70-30) 100 UNIT/ML injection Inject 28 Units into the skin 2 (two) times daily with a meal. Patient taking differently: Inject 30 Units into the skin daily with breakfast. 08/04/19   Love, Ivan Anchors, PA-C  liver oil-zinc oxide (DESITIN) 40 % ointment Apply topically as needed for irritation. 03/14/21   Fritzi Mandes, MD  mirtazapine (REMERON) 15 MG tablet Take 1 tablet (15 mg total) by mouth at bedtime. 11/08/20 11/08/21  Pokhrel, Corrie Mckusick, MD  ondansetron (ZOFRAN-ODT) 4 MG disintegrating tablet Take 4 mg by mouth every 8 (eight) hours as needed. 02/23/21   [provider]  oxyCODONE (OXY IR/ROXICODONE) 5 MG immediate release tablet Take 1 tablet (5 mg total) by mouth every 12 (twelve) hours as needed for moderate pain or severe pain. 03/28/21   Tylene Fantasia, PA-C  Pancrelipase, Lip-Prot-Amyl, 24000-76000 units CPEP Take 3 capsules (72,000 Units total) by mouth 3 (three) times daily. Patient taking differently: Take 3 capsules by mouth 3 (three) times daily. Appears to be noncompliant 12/27/20   Loletha Grayer, MD  potassium chloride SA (KLOR-CON M) 20 MEQ tablet Take 1 tablet (20 mEq total) by mouth daily. 03/14/21 03/14/22  Fritzi Mandes, MD  rOPINIRole (REQUIP) 1 MG tablet Take 1 tablet (1 mg total) by mouth 2 (two) times  daily. Patient taking differently: Take 0.5 mg by mouth at bedtime. 08/04/19   Bary Leriche, PA-C    Physical Exam: Vitals:   04/05/21 0900 04/05/21 1000 04/05/21 1535 04/05/21 1605  BP: (!) 149/94 (!) 129/100 (!) 149/90 (!) 160/101  Pulse: 99 92 85   Resp: 18 17 19 17   Temp: 97.9 F (36.6 C)     TempSrc: Oral     SpO2: 100% 98% 97%   Weight:      Height:       General: Not in acute distress. Thin body habitus, dry mucous membrane HEENT:       Eyes: PERRL, EOMI, no scleral icterus.       ENT: No discharge from the ears and nose       Neck: No JVD, no bruit, no mass felt. Heme: No neck lymph node enlargement. Cardiac: S1/S2, RRR, No murmurs, No gallops or rubs. Respiratory: No rales, wheezing, rhonchi or rubs. GI: Soft, nondistended, nontender, no organomegaly, BS present. S/p of the ostomy GU: No hematuria Ext:  No pitting leg edema bilaterally. 1+DP/PT pulse bilaterally. Musculoskeletal: No joint deformities, No joint redness or warmth, no limitation of ROM in spin. Skin: No rashes.  Neuro: Confused, not orientated x3, has right-sided weakness Psych: Patient is not psychotic, no suicidal or hemocidal ideation.  Labs on Admission: I have personally reviewed following labs and imaging studies  CBC: Recent Labs  Lab 04/05/21 0824  WBC 10.9*  HGB 12.0  HCT 40.2  MCV 96.9  PLT 096*   Basic Metabolic Panel: Recent Labs  Lab 04/05/21 0824 04/05/21 1032  NA 136 138  K 3.4* 3.4*  CL 96* 99  CO2 18* 17*  GLUCOSE 460* 438*  BUN 12 12  CREATININE 0.94 0.80  CALCIUM 8.5* 8.3*  MG 1.6*  --    GFR: Estimated Creatinine Clearance: 47.7 mL/min (by C-G formula based on SCr of 0.8 mg/dL). Liver Function Tests: Recent Labs  Lab 04/05/21 0824  AST 38  ALT 51*  ALKPHOS 176*  BILITOT 0.9  PROT 8.6*  ALBUMIN 3.3*   No results for input(s): LIPASE, AMYLASE in the last 168 hours. No results for input(s): AMMONIA in the last 168 hours. Coagulation Profile: Recent  Labs  Lab 04/05/21 0824  INR 1.0   Cardiac Enzymes: Recent Labs  Lab 04/05/21 1032  CKTOTAL 212   BNP (last 3 results) No results for input(s): PROBNP in the last 8760 hours. HbA1C: No results for input(s): HGBA1C in the last 72 hours. CBG: Recent Labs  Lab 04/05/21 1035  GLUCAP 431*   Lipid Profile: No results for input(s): CHOL, HDL, LDLCALC, TRIG, CHOLHDL, LDLDIRECT in the last 72 hours. Thyroid Function Tests: No results for input(s): TSH, T4TOTAL, FREET4, T3FREE, THYROIDAB in the last 72 hours. Anemia Panel: No results for input(s): VITAMINB12, FOLATE, FERRITIN, TIBC, IRON, RETICCTPCT in the last 72 hours. Urine analysis:    Component Value Date/Time   COLORURINE STRAW (A) 04/05/2021 1058   APPEARANCEUR CLEAR (A) 04/05/2021 1058   APPEARANCEUR Hazy 04/04/2011 1935   LABSPEC 1.022 04/05/2021 1058   LABSPEC >1.060 04/04/2011 1935   PHURINE 5.0 04/05/2021 1058   GLUCOSEU >=500 (A) 04/05/2021 1058   GLUCOSEU 50 mg/dL 04/04/2011 1935   HGBUR SMALL (A) 04/05/2021 1058   BILIRUBINUR NEGATIVE 04/05/2021 1058   BILIRUBINUR Negative 04/04/2011 1935   KETONESUR 80 (A) 04/05/2021 1058   PROTEINUR 30 (A) 04/05/2021 1058   NITRITE NEGATIVE 04/05/2021 1058   LEUKOCYTESUR NEGATIVE 04/05/2021 1058   LEUKOCYTESUR Negative 04/04/2011 1935   Sepsis Labs: @LABRCNTIP (procalcitonin:4,lacticidven:4) ) Recent Results (from the past 240 hour(s))  Resp Panel by RT-PCR (Flu A&B, Covid) Nasopharyngeal Swab     Status: Abnormal   Collection Time: 04/05/21  8:25 AM   Specimen: Nasopharyngeal Swab; Nasopharyngeal(NP) swabs in vial transport medium  Result Value Ref Range Status   SARS Coronavirus 2 by RT PCR POSITIVE (A) NEGATIVE Final    Comment: (NOTE) SARS-CoV-2 target nucleic acids are DETECTED.  The SARS-CoV-2 RNA is generally detectable in upper respiratory specimens during the acute phase of infection. Positive results are indicative of the presence of the identified virus,  but do not rule out bacterial infection or co-infection with other pathogens not detected by the test. Clinical correlation with patient history and other diagnostic information is necessary to determine patient infection status. The expected result is Negative.  Fact Sheet for Patients: EntrepreneurPulse.com.au  Fact Sheet for Healthcare Providers: IncredibleEmployment.be  This test is not yet approved or cleared by the [place] FDA and  has been  authorized for detection and/or diagnosis of SARS-CoV-2 by FDA under an Emergency Use Authorization (EUA).  This EUA will remain in effect (meaning this test can be used) for the duration of  the COVID-19 declaration under Section 564(b)(1) of the A ct, 21 U.S.C. section 360bbb-3(b)(1), unless the authorization is terminated or revoked sooner.     Influenza A by PCR NEGATIVE NEGATIVE Final   Influenza B by PCR NEGATIVE NEGATIVE Final    Comment: (NOTE) The Xpert Xpress SARS-CoV-2/FLU/RSV plus assay is intended as an aid in the diagnosis of influenza from Nasopharyngeal swab specimens and should not be used as a sole basis for treatment. Nasal washings and aspirates are unacceptable for Xpert Xpress SARS-CoV-2/FLU/RSV testing.  Fact Sheet for Patients: EntrepreneurPulse.com.au  Fact Sheet for Healthcare Providers: IncredibleEmployment.be  This test is not yet approved or cleared by the [place] FDA and has been authorized for detection and/or diagnosis of SARS-CoV-2 by FDA under an Emergency Use Authorization (EUA). This EUA will remain in effect (meaning this test can be used) for the duration of the COVID-19 declaration under Section 564(b)(1) of the Act, 21 U.S.C. section 360bbb-3(b)(1), unless the authorization is terminated or revoked.  Performed at [facility], [address] [place],  [address]      Radiological Exams on  Admission: CT HEAD WO CONTRAST (5MM)  Result Date: 04/05/2021 CLINICAL DATA:  Head trauma, abnormal mental status (Age 35-64y); Facial trauma, blunt EXAM: CT HEAD WITHOUT CONTRAST CT CERVICAL SPINE WITHOUT CONTRAST TECHNIQUE: Multidetector CT imaging of the head and cervical spine was performed following the standard protocol without intravenous contrast. Multiplanar CT image reconstructions of the cervical spine were also generated. RADIATION DOSE REDUCTION: This exam was performed according to the departmental dose-optimization program which includes automated exposure control, adjustment of the mA and/or kV according to patient size and/or use of iterative reconstruction technique. COMPARISON:  11/14/2019, 12/26/2020 FINDINGS: CT HEAD FINDINGS Brain: Continued evolution of known left PCA territory infarction. Additional remote left cerebellar and right frontal lobe infarcts. No evidence of acute large territory infarction. No evidence of hemorrhage, hydrocephalus, extra-axial collection, or mass. Scattered low-density changes within the periventricular and subcortical white matter compatible with chronic microvascular ischemic change. Mild diffuse cerebral volume loss. Vascular: Atherosclerotic calcifications involving the large vessels of the skull base. No unexpected hyperdense vessel. Skull: Normal. Negative for fracture or focal lesion. Sinuses/Orbits: No acute finding. Other: None. CT CERVICAL SPINE FINDINGS Alignment: Facet joints are aligned without dislocation or traumatic listhesis. Rotatory subluxation at C1-2. Patient is also side bent to the left. Unchanged grade 1 anterolisthesis of C4 on C5. Skull base and vertebrae: No acute fracture. No primary bone lesion or focal pathologic process. Soft tissues and spinal canal: No prevertebral fluid or swelling. No visible canal hematoma. Disc levels: Degenerative disc disease most pronounced at the C5-6 and C6-7 levels. Multilevel bilateral facet  arthropathy. Upper chest: Emphysematous changes within the lung apices. Other: None. IMPRESSION: 1. No acute intracranial abnormality. 2. Chronic small vessel ischemic disease and multiple chronic infarcts. 3. No acute fracture or evidence of traumatic listhesis of the cervical spine. 4. Rotatory subluxation at C1-2, which may be positional in the absence of a fixed torticollis. Electronically Signed   By: Davina Poke D.O.   On: 04/05/2021 09:37   CT Cervical Spine Wo Contrast  Result Date: 04/05/2021 CLINICAL DATA:  Head trauma, abnormal mental status (Age 8-64y); Facial trauma, blunt EXAM: CT HEAD WITHOUT CONTRAST CT CERVICAL SPINE WITHOUT CONTRAST TECHNIQUE: Multidetector  CT imaging of the head and cervical spine was performed following the standard protocol without intravenous contrast. Multiplanar CT image reconstructions of the cervical spine were also generated. RADIATION DOSE REDUCTION: This exam was performed according to the departmental dose-optimization program which includes automated exposure control, adjustment of the mA and/or kV according to patient size and/or use of iterative reconstruction technique. COMPARISON:  11/14/2019, 12/26/2020 FINDINGS: CT HEAD FINDINGS Brain: Continued evolution of known left PCA territory infarction. Additional remote left cerebellar and right frontal lobe infarcts. No evidence of acute large territory infarction. No evidence of hemorrhage, hydrocephalus, extra-axial collection, or mass. Scattered low-density changes within the periventricular and subcortical white matter compatible with chronic microvascular ischemic change. Mild diffuse cerebral volume loss. Vascular: Atherosclerotic calcifications involving the large vessels of the skull base. No unexpected hyperdense vessel. Skull: Normal. Negative for fracture or focal lesion. Sinuses/Orbits: No acute finding. Other: None. CT CERVICAL SPINE FINDINGS Alignment: Facet joints are aligned without dislocation  or traumatic listhesis. Rotatory subluxation at C1-2. Patient is also side bent to the left. Unchanged grade 1 anterolisthesis of C4 on C5. Skull base and vertebrae: No acute fracture. No primary bone lesion or focal pathologic process. Soft tissues and spinal canal: No prevertebral fluid or swelling. No visible canal hematoma. Disc levels: Degenerative disc disease most pronounced at the C5-6 and C6-7 levels. Multilevel bilateral facet arthropathy. Upper chest: Emphysematous changes within the lung apices. Other: None. IMPRESSION: 1. No acute intracranial abnormality. 2. Chronic small vessel ischemic disease and multiple chronic infarcts. 3. No acute fracture or evidence of traumatic listhesis of the cervical spine. 4. Rotatory subluxation at C1-2, which may be positional in the absence of a fixed torticollis. Electronically Signed   By: Davina Poke D.O.   On: 04/05/2021 09:37   MR BRAIN WO CONTRAST  Result Date: 04/05/2021 CLINICAL DATA:  Provided history: Seizure disorder, clinical change. Additional history provided: 3 seizures today, leftward gaze. EXAM: MRI HEAD WITHOUT CONTRAST TECHNIQUE: Multiplanar, multiecho pulse sequences of the brain and surrounding structures were obtained without intravenous contrast. COMPARISON:  Prior head CT examinations 04/05/2021 and earlier. Brain MRI 12/26/2020. FINDINGS: The patient was unable to tolerate the full examination. Only the following sequences could be acquired: Axial diffusion-weighted imaging, coronal diffusion-weighted imaging, axial T2 FLAIR sequence, axial T2 GRE sequences, axial T2 TSE sequence and axial T1 weighted sequence. The acquired imaging is motion degraded. Most notably, there is severe motion degradation of the axial T2 FLAIR sequence, severe motion degradation of the axial T2 GRE sequence, moderate motion degradation of the axial T2 TSE sequence and severe motion degradation of the axial T1 weighted sequence. Brain: The diffusion-weighted  imaging is of good quality. There is no evidence of acute infarct. There is significantly limited evaluation of the brain parenchyma on the remaining acquired sequences due to motion degradation. Known small chronic cortical/subcortical right MCA territory infarcts within the mid to posterior right frontal lobe. Known large chronic cortical/subcortical left PCA territory infarct within the left temporal and occipital lobes. Background moderate multifocal T2 FLAIR hyperintense signal abnormality within the cerebral white matter, nonspecific but compatible with chronic small vessel ischemic disease. Known large chronic infarct within the left cerebellar hemisphere. Within described limitations, no intracranial mass or extra-axial fluid collection is identified. No midline shift. No age advanced or lobar predominant atrophy. Vascular: Maintained flow voids within the proximal large arterial vessels. Skull and upper cervical spine: No focal suspicious marrow lesion is identified on the acquired sequences. Sinuses/Orbits: Visualized orbits show no  acute finding. Mild mucosal thickening within the bilateral ethmoid, right sphenoid and bilateral maxillary sinuses. IMPRESSION: 1. Prematurely terminated, significantly motion degraded and limited examination as described. 2. The diffusion-weighted imaging is of good quality. No evidence of acute infarct. 3. Within described limitations, no acute intracranial abnormality is identified. 4. Known small chronic cortical/subcortical right MCA territory infarcts within the mid-to-posterior right frontal lobe. 5. Known large chronic cortical/subcortical left PCA territory infarct within the left temporal and occipital lobes. 6. Background moderate chronic small vessel ischemic changes within the cerebral white matter. 7. Known large chronic infarct within the left cerebellar hemisphere. 8. Mild mucosal thickening within the paranasal sinuses. Electronically Signed   By: Kellie Simmering  D.O.   On: 04/05/2021 12:39   EEG adult  Result Date: 04/05/2021 Greta Doom, MD     04/05/2021  4:29 PM History: 61 year old female with new onset confusion Sedation: Ativan given earlier in the day Technique: This EEG was acquired with electrodes placed according to the International 10-20 electrode system (including Fp1, Fp2, F3, F4, C3, C4, P3, P4, O1, O2, T3, T4, T5, T6, A1, A2, Fz, Cz, Pz). The following electrodes were missing or displaced: none. Background: There is a posterior dominant rhythm of 10 Hz which is better seen on the right than the left.  Sleep structures are observed, better seen on the right than the left.  She has lateralized periodic discharges(LPDs) with a frequency of 0.5 to 1 Hz with delta wave morphology seen with a wide field in the left hemisphere, maximal in the frontotemporal regions. There eare also sharp waves seen at T7 > F7 > F3,Fp1. There is a period of evolution consistent with an ictal discharge with left frontotemporal onset lasting for 30 seconds with no clinical correlate. Photic stimulation: Physiologic driving is not performed EEG Abnormalities: 1) Brief electrographic subclinical seizure 2) LPDs with delta morphology in the left frontotemporal region without additional concerning features. 3) Left frontotemporal sharp waves Clinical Interpretation: This EEG is consistent with an area of significant cortical irritability in the left hemisphere with a pattern on the ictal-interctal continuum. One definite seizure lasting 30 seconds was recorded in the left frontotemporal region. Roland Rack, MD [facility] [phone] If 7pm- 7am, please page neurology on call as listed in [place].      Assessment/Plan Principal Problem:   DKA (diabetic ketoacidosis) ([place]) Active Problems:   Acute metabolic encephalopathy   COPD, mild (HCC)   Type 2 diabetes mellitus with hyperlipidemia (HCC)   Essential hypertension   Hypokalemia    Protein-calorie malnutrition, severe   Elevated troponin   History of stroke   Leukocytosis   HLD (hyperlipidemia)   Depression with anxiety   Tobacco abuse   Chronic diarrhea   Hypomagnesemia   COVID-19 virus infection   Principal Problem:   DKA (diabetic ketoacidosis) ([place]) Active Problems:   Acute metabolic encephalopathy   COPD, mild (HCC)   Type 2 diabetes mellitus with hyperlipidemia (HCC)   Essential hypertension   Hypokalemia   Protein-calorie malnutrition, severe   Elevated troponin   History of stroke   Leukocytosis   HLD (hyperlipidemia)   Depression with anxiety   Tobacco abuse   Chronic diarrhea   Hypomagnesemia   COVID-19 virus infection   DKA (diabetic ketoacidosis) ([place]): Patient seems to have early stage of DKA with blood sugar 460, bicarbonate 18, anion gap 22.  Pending beta hydroxybutyric acid level and urinalysis for ketones.  - Admit to stepdown  - IVF:  2L  of NS bolus - start DKA protocol with BMP q4h - IVF: LR at 125 cc/h, will switch to D5-LR at 125 cc/h when CBG<250 - replete K as needed - Zofran prn nausea  - NPO  - consult to diabetic educator  Acute metabolic encephalopathy: Etiology is not clear.  CT head is negative for acute intracranial abnormalities.  Likely multifactorial etiology including DKA and electrolytes disturbance.  Potential differential diagnosis include seizure and new stroke -Frequent neurochecks -EEG -Follow-up MRI of brain -Seizure precaution -As needed Ativan for seizure  Addendum: MRI of brain has motion degradation, but is negative for acute stroke.  Per Dr. Leonel Ramsay of neurology, EEG showed subclinical seizure.  Patient is started on Keppra 500 mg twice daily.  Dr. Leonel Ramsay recommend to transfer patient to [facility] for continuous EEG.  COPD, mild ([place]): stable -prn albuterol nebs  Type 2 diabetes mellitus with hyperlipidemia: Recent A1c 15.2, poorly controlled.  Patient is taking NPH insulin at  home -now on DKA protocol  Essential hypertension -prn IV hydralazine -Lotensin  Hypokalemia:K 3.4 -repleted K -check Mg   Protein-calorie malnutrition, severe: BMI 17.61 -Consult nutrition  Elevated troponin: Troponin is minimally elevated 21 and 22.  Does not seem to have chest pain.  Likely demand ischemia -start ASA 81 mg daily -lipitor -check A1c and FLP  History of stroke -ASA and lipitor  Leukocytosis: WBC a 10.9. No fever.  No source of infection identified.  Patient is s/p of splenectomy.  Will have low threshold to start antibiotics. -F/u UA -get Blood culture   HLD (hyperlipidemia) -Lipitor  Depression with anxiety -continue home meds  Tobacco abuse: -Nicotine patch  Chronic diarrhea: -f/u C diff  -IVF as above  Abnormal findings of CT of C-spin: it showed rotatory subluxation at C1-2, which may be positional in the absence of a fixed torticollis per radiologist.  Patient denies any neck pain.  -Consulted  Dr. Cari Caraway of neurosurgery  Social issues: Patient is living with her daughter, obviously patient is not getting appropriate care at home.  Per ED physician, they called [employer] ([employer]) and left a message to them -will consult TOC  Covid 19 infection: pt has positive COVID-19 test, but she is asymptomatic.  No shortness of breath, cough, chest pain.  Oxygen saturation 98% on room air. -As needed albuterol and mucinex  Addendum: per Nurse and Dr. Jacqualine Code of ED, pt fell out of bed. Pt is still confused. -CT-head and CT of C-spin --> no acute new injury.     DVT ppx:  SQ Lovenox  Code Status: Full code per her daughter  Family Communication:  Yes, patient's  daughter  by phone  Disposition Plan: to be determined  Consults called:  Dr. Cari Caraway of neurosurgery  Admission status and Level of care: Stepdown:   SDU/inpation         Severity of Illness:  The appropriate patient status for this patient is INPATIENT.  Inpatient status is judged to be reasonable and necessary in order to provide the required intensity of service to ensure the patient's safety. The patient's presenting symptoms, physical exam findings, and initial radiographic and laboratory data in the context of their chronic comorbidities is felt to place them at high risk for further clinical deterioration. Furthermore, it is not anticipated that the patient will be medically stable for discharge from the hospital within 2 midnights of admission.   * I certify that at the point of admission it is my clinical judgment that the  patient will require inpatient hospital care spanning beyond 2 midnights from the point of admission due to high intensity of service, high risk for further deterioration and high frequency of surveillance required.*       Date of Service 04/05/2021    [facility]   If 7PM-7AM, please contact night-coverage [email or web address] 04/05/2021, 6:03 PM

## 2021-04-05 NOTE — ED Notes (Signed)
This RN gave report to [facility] via telephone.

## 2021-04-05 NOTE — Discharge Summary (Addendum)
Physician Discharge Summary  Brittany [name] GGY:[id number] DOB: 1960-08-12 DOA: 04/05/2021  PCP: [facility], [facility]  Admit date: 04/05/2021 Discharge date: 04/05/2021  Recommendations for Outpatient Follow-up:  -Transfer to [facility] hospital for continuous EEG   Home Health: none Equipment/Devices: none  Discharge Condition: stable CODE STATUS: full Diet recommendation: NPO now, due to DKA  Brief/Interim Summary (HPI)  Brittany [name] is a 61 y.o. female with medical history significant of s/p of colostomy due to history of diverticulitis and abscess, s/p of pancreectomy and splenectomy, hypertension, hyperlipidemia, diabetes mellitus, COPD, stroke with right-sided weakness, GERD, depression with anxiety, discoid lupus erythematosus, tobacco abuse, DKA, who presents with altered mental status.   Per her daughter (I called her daughter by phone), at normal baseline, patient is oriented x 3. She has a history of stroke with right-sided weakness. Normally pt can use a cane to walk. This AM, pt was found to be on the floor with confusion. Pt has jerking and slurred speech per her daughter.    Per EDP, EMS reported that the house was extremely filthy. The patient appeared to be in a soiled diaper. Pt has an colostomy with no collection bag in place. Daughter did not know which medications the patient was on.  EMS states they witnessed what could be a partial seizure, and gave the patient a total of 4 mg of intranasal Versed in route to the hospital. When I saw pt in ED. Pt is confused, not orientated x3. She has right-sided weakness which is likely from previous stroke.  No active cough, respiratory distress noted.  No active nausea and vomiting noted.  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 Pt does not seem to have chest pain.  Not sure if patient has symptoms of UTI.  Her daughter is not sure if patient has a history of seizure.  Daughter states that she found a  Keppra bottle which is about 62 years old, not sure if patient is taking his medications or not.     Data Reviewed and ED Course: pt was found to have Blood sugar 460, bicarbonate 18, anion gap 22, WBC 10.9, INR 1.0, troponin level 21, pending beta hydroxybutyric acid, pending urinalysis, pending COVID PCR, potassium 3.4, GFR>60, temperature normal, blood pressure 149/94, heart rate 99, RR 18, oxygen saturation 98% on room air.  ABG with pH 7.36, CO2 36, O2 52.  Patient is admitted to stepdown as inpatient.   CT of head and C-spin 1. No acute intracranial abnormality. 2. Chronic small vessel ischemic disease and multiple chronic infarcts. 3. No acute fracture or evidence of traumatic listhesis of the cervical spine. 4. Rotatory subluxation at C1-2, which may be positional in the absence of a fixed torticollis.   EKG: I have personally reviewed. Poor policy of EKG strip, QTc 524, possible right axis deviation, seem to be sinus rhythm.   Discharge Diagnoses and Hospital Course:   Principal Problem:   DKA (diabetic ketoacidosis) ([place]) Active Problems:   Acute metabolic encephalopathy   COPD, mild (HCC)   Type 2 diabetes mellitus with hyperlipidemia (HCC)   Essential hypertension   Hypokalemia   Protein-calorie malnutrition, severe   Elevated troponin   History of stroke   Leukocytosis   HLD (hyperlipidemia)   Depression with anxiety   Tobacco abuse   Chronic diarrhea   Hypomagnesemia   COVID-19 virus infection    DKA (diabetic ketoacidosis) ([place]): Patient seems to have early stage of DKA with blood  sugar 460, bicarbonate 18, anion gap 22.  Pending beta hydroxybutyric acid level and urinalysis for ketones.  - Admited to stepdown  - IVF:  2L of NS bolus - start DKA protocol with BMP q4h - IVF: LR at 125 cc/h, will switch to D5-LR at 125 cc/h when CBG<250 - replete K as needed - Zofran prn nausea  - NPO  - consulted to diabetic educator  Acute metabolic encephalopathy:  Etiology is not clear.  CT head is negative for acute intracranial abnormalities.  Likely multifactorial etiology including DKA and electrolytes disturbance.  Potential differential diagnosis include seizure and new stroke. MRI of brain has motion degradation, but is negative for acute stroke.  Per Dr. Leonel Ramsay of neurology, EEG showed subclinical seizure.  Patient is started on Keppra 500 mg twice daily.  Dr. Leonel Ramsay recommend to transfer patient to [facility] for continuous EEG. -Frequent neurochecks -Seizure precaution -As needed Ativan for seizure -keppra 500 mg bid IV -I called [facility] for transfer patient to [facility].  Requested stepdown bed. -I also tried to call her sister without success, she did not pick up the phone.  I left a message to her.   COPD, mild ([place]): stable -prn albuterol nebs  Type 2 diabetes mellitus with hyperlipidemia: Recent A1c 15.2, poorly controlled.  Patient is taking NPH insulin at home -now on DKA protocol  Essential hypertension -prn IV hydralazine -Lotensin  Hypokalemia and   Hypomagnesemia;  K 3.4 and Mg 1.6 -repleted K and Mg  Protein-calorie malnutrition, severe: BMI 17.61 -Consulted nutrition  Elevated troponin: Troponin is minimally elevated 21 and 22.  Does not seem to have chest pain.  Likely demand ischemia -started ASA 81 mg daily -lipitor -checked  A1c and FLP  History of stroke -ASA and lipitor  Leukocytosis: WBC a 10.9. No fever.  No source of infection identified.  Patient is s/p of splenectomy.  Will have low threshold to start antibiotics. -F/u UA -get Blood culture   HLD (hyperlipidemia) -Lipitor  Depression with anxiety -continue home meds  Tobacco abuse: -Nicotine patch  Chronic diarrhea: -f/u C diff  -IVF as above  Abnormal findings of CT of C-spin: it showed rotatory subluxation at C1-2, which may be positional in the absence of a fixed torticollis per radiologist.  Patient denies any neck pain.   -Consulted  Dr. Cari Caraway of neurosurgery -->  per. Dr. Cari Caraway, no need for any treatment, it is positional.  Social issues: Patient is living with her daughter, obviously patient is not getting appropriate care at home.  Per ED physician, they called APS (Adult Protective Services) and left a message to them -will consult TOC  Covid 19 infection: pt has positive COVID-19 test, but she is asymptomatic.  No shortness of breath, cough, chest pain.  Oxygen saturation 98% on room air. -As needed albuterol and mucinex     Discharge Instructions   Allergies as of 04/05/2021       Reactions   Cephalosporins Itching   TOLERATED ZOSYN (PIPERACILLIN) BEFORE   Hydromorphone Itching   Keflin [cephalothin] Itching   Lactose Intolerance (gi) Diarrhea     Med Rec must be completed prior to using this [facility]       Allergies  Allergen Reactions   Cephalosporins Itching    TOLERATED ZOSYN (PIPERACILLIN) BEFORE   Hydromorphone Itching   Keflin [Cephalothin] Itching   Lactose Intolerance (Gi) Diarrhea    Consultations: Dr. Leonel Ramsay from neurology Dr. Cari Caraway for neurosurgery   Procedures/Studies: CT HEAD WO CONTRAST (  5MM)  Result Date: 04/05/2021 CLINICAL DATA:  Head trauma, abnormal mental status (Age 68-64y); Facial trauma, blunt EXAM: CT HEAD WITHOUT CONTRAST CT CERVICAL SPINE WITHOUT CONTRAST TECHNIQUE: Multidetector CT imaging of the head and cervical spine was performed following the standard protocol without intravenous contrast. Multiplanar CT image reconstructions of the cervical spine were also generated. RADIATION DOSE REDUCTION: This exam was performed according to the departmental dose-optimization program which includes automated exposure control, adjustment of the mA and/or kV according to patient size and/or use of iterative reconstruction technique. COMPARISON:  11/14/2019, 12/26/2020 FINDINGS: CT HEAD FINDINGS Brain: Continued evolution of known left PCA  territory infarction. Additional remote left cerebellar and right frontal lobe infarcts. No evidence of acute large territory infarction. No evidence of hemorrhage, hydrocephalus, extra-axial collection, or mass. Scattered low-density changes within the periventricular and subcortical white matter compatible with chronic microvascular ischemic change. Mild diffuse cerebral volume loss. Vascular: Atherosclerotic calcifications involving the large vessels of the skull base. No unexpected hyperdense vessel. Skull: Normal. Negative for fracture or focal lesion. Sinuses/Orbits: No acute finding. Other: None. CT CERVICAL SPINE FINDINGS Alignment: Facet joints are aligned without dislocation or traumatic listhesis. Rotatory subluxation at C1-2. Patient is also side bent to the left. Unchanged grade 1 anterolisthesis of C4 on C5. Skull base and vertebrae: No acute fracture. No primary bone lesion or focal pathologic process. Soft tissues and spinal canal: No prevertebral fluid or swelling. No visible canal hematoma. Disc levels: Degenerative disc disease most pronounced at the C5-6 and C6-7 levels. Multilevel bilateral facet arthropathy. Upper chest: Emphysematous changes within the lung apices. Other: None. IMPRESSION: 1. No acute intracranial abnormality. 2. Chronic small vessel ischemic disease and multiple chronic infarcts. 3. No acute fracture or evidence of traumatic listhesis of the cervical spine. 4. Rotatory subluxation at C1-2, which may be positional in the absence of a fixed torticollis. Electronically Signed   By: Davina Poke D.O.   On: 04/05/2021 09:37   CT Cervical Spine Wo Contrast  Result Date: 04/05/2021 CLINICAL DATA:  Head trauma, abnormal mental status (Age 61-64y); Facial trauma, blunt EXAM: CT HEAD WITHOUT CONTRAST CT CERVICAL SPINE WITHOUT CONTRAST TECHNIQUE: Multidetector CT imaging of the head and cervical spine was performed following the standard protocol without intravenous contrast.  Multiplanar CT image reconstructions of the cervical spine were also generated. RADIATION DOSE REDUCTION: This exam was performed according to the departmental dose-optimization program which includes automated exposure control, adjustment of the mA and/or kV according to patient size and/or use of iterative reconstruction technique. COMPARISON:  11/14/2019, 12/26/2020 FINDINGS: CT HEAD FINDINGS Brain: Continued evolution of known left PCA territory infarction. Additional remote left cerebellar and right frontal lobe infarcts. No evidence of acute large territory infarction. No evidence of hemorrhage, hydrocephalus, extra-axial collection, or mass. Scattered low-density changes within the periventricular and subcortical white matter compatible with chronic microvascular ischemic change. Mild diffuse cerebral volume loss. Vascular: Atherosclerotic calcifications involving the large vessels of the skull base. No unexpected hyperdense vessel. Skull: Normal. Negative for fracture or focal lesion. Sinuses/Orbits: No acute finding. Other: None. CT CERVICAL SPINE FINDINGS Alignment: Facet joints are aligned without dislocation or traumatic listhesis. Rotatory subluxation at C1-2. Patient is also side bent to the left. Unchanged grade 1 anterolisthesis of C4 on C5. Skull base and vertebrae: No acute fracture. No primary bone lesion or focal pathologic process. Soft tissues and spinal canal: No prevertebral fluid or swelling. No visible canal hematoma. Disc levels: Degenerative disc disease most pronounced at the C5-6 and C6-7  levels. Multilevel bilateral facet arthropathy. Upper chest: Emphysematous changes within the lung apices. Other: None. IMPRESSION: 1. No acute intracranial abnormality. 2. Chronic small vessel ischemic disease and multiple chronic infarcts. 3. No acute fracture or evidence of traumatic listhesis of the cervical spine. 4. Rotatory subluxation at C1-2, which may be positional in the absence of a fixed  torticollis. Electronically Signed   By: Davina Poke D.O.   On: 04/05/2021 09:37   MR BRAIN WO CONTRAST  Result Date: 04/05/2021 CLINICAL DATA:  Provided history: Seizure disorder, clinical change. Additional history provided: 3 seizures today, leftward gaze. EXAM: MRI HEAD WITHOUT CONTRAST TECHNIQUE: Multiplanar, multiecho pulse sequences of the brain and surrounding structures were obtained without intravenous contrast. COMPARISON:  Prior head CT examinations 04/05/2021 and earlier. Brain MRI 12/26/2020. FINDINGS: The patient was unable to tolerate the full examination. Only the following sequences could be acquired: Axial diffusion-weighted imaging, coronal diffusion-weighted imaging, axial T2 FLAIR sequence, axial T2 GRE sequences, axial T2 TSE sequence and axial T1 weighted sequence. The acquired imaging is motion degraded. Most notably, there is severe motion degradation of the axial T2 FLAIR sequence, severe motion degradation of the axial T2 GRE sequence, moderate motion degradation of the axial T2 TSE sequence and severe motion degradation of the axial T1 weighted sequence. Brain: The diffusion-weighted imaging is of good quality. There is no evidence of acute infarct. There is significantly limited evaluation of the brain parenchyma on the remaining acquired sequences due to motion degradation. Known small chronic cortical/subcortical right MCA territory infarcts within the mid to posterior right frontal lobe. Known large chronic cortical/subcortical left PCA territory infarct within the left temporal and occipital lobes. Background moderate multifocal T2 FLAIR hyperintense signal abnormality within the cerebral white matter, nonspecific but compatible with chronic small vessel ischemic disease. Known large chronic infarct within the left cerebellar hemisphere. Within described limitations, no intracranial mass or extra-axial fluid collection is identified. No midline shift. No age advanced or  lobar predominant atrophy. Vascular: Maintained flow voids within the proximal large arterial vessels. Skull and upper cervical spine: No focal suspicious marrow lesion is identified on the acquired sequences. Sinuses/Orbits: Visualized orbits show no acute finding. Mild mucosal thickening within the bilateral ethmoid, right sphenoid and bilateral maxillary sinuses. IMPRESSION: 1. Prematurely terminated, significantly motion degraded and limited examination as described. 2. The diffusion-weighted imaging is of good quality. No evidence of acute infarct. 3. Within described limitations, no acute intracranial abnormality is identified. 4. Known small chronic cortical/subcortical right MCA territory infarcts within the mid-to-posterior right frontal lobe. 5. Known large chronic cortical/subcortical left PCA territory infarct within the left temporal and occipital lobes. 6. Background moderate chronic small vessel ischemic changes within the cerebral white matter. 7. Known large chronic infarct within the left cerebellar hemisphere. 8. Mild mucosal thickening within the paranasal sinuses. Electronically Signed   By: Kellie Simmering D.O.   On: 04/05/2021 12:39   CT ABDOMEN PELVIS W CONTRAST  Result Date: 03/09/2021 CLINICAL DATA:  Abdominal pain. Reassessment diverticulitis with abscess formation. EXAM: CT ABDOMEN AND PELVIS WITH CONTRAST TECHNIQUE: Multidetector CT imaging of the abdomen and pelvis was performed using the standard protocol following bolus administration of intravenous contrast. RADIATION DOSE REDUCTION: This exam was performed according to the departmental dose-optimization program which includes automated exposure control, adjustment of the mA and/or kV according to patient size and/or use of iterative reconstruction technique. CONTRAST:  46mL OMNIPAQUE IOHEXOL 300 MG/ML  SOLN COMPARISON:  CT abdomen and pelvis 03/05/2021 FINDINGS: Lower chest: Respiratory  motion and mild atelectasis in the lung  bases. No pleural effusion. Hepatobiliary: No focal liver abnormality is seen. No gallstones, gallbladder wall thickening, or biliary dilatation. Pancreas: Status post distal pancreatectomy. Unchanged multiloculated cystic mass involving the pancreatic head/uncinate process with maximal diameter 4.6 cm on axial images. Spleen: Status post splenectomy. Adrenals/Urinary Tract: Unremarkable right adrenal gland. Unchanged 1.2 cm left adrenal nodule, more fully characterized on recent MRI. No evidence of a renal mass, calculi, or hydronephrosis. Unremarkable bladder. Stomach/Bowel: The stomach is unremarkable. There is no evidence of bowel obstruction. The inflamed sigmoid colon with associated intramural abscess formation has not significantly changed from the prior CT. There is increased wall thickening versus underdistention of the more distal sigmoid colon and rectum. An abscess lateral to the inflamed proximal sigmoid colon at the anterior aspect of the left iliac fossa has mildly decreased in size, now measuring 2.8 x 1.4 cm (series 5, image 14, previously 4.5 x 2.3 cm), in this abscess is again noted to communicate with an additional abscess more superficially in the left inguinal region which has also mildly decreased in size, measuring 4.3 x 3.5 cm (series 5, image 17, previously 5.3 x 3.8 cm). No new abscess is identified. Vascular/Lymphatic: Abdominal aortic atherosclerosis without aneurysm. No enlarged lymph nodes. Reproductive: Grossly unremarkable uterus.  Tubal ligation clips. Other: No significant ascites. No pneumoperitoneum. Musculoskeletal: No acute osseous abnormality or suspicious osseous lesion. Advanced lower lumbar facet arthrosis with grade 1 anterolisthesis of L3 on L4. IMPRESSION: 1. Unchanged appearance of the inflamed proximal sigmoid colon consistent with diverticulitis with intramural abscess formation. Increased wall thickening versus underdistention of the more distal sigmoid colon and  rectum. 2. Mildly decreased size of 2 abscesses extending to the left inguinal region. 3. Unchanged multiloculated cystic pancreatic mass. 4. Aortic Atherosclerosis (ICD10-I70.0). Electronically Signed   By: Logan Bores M.D.   On: 03/09/2021 10:33   EEG adult  Result Date: 04/05/2021 Greta Doom, MD     04/05/2021  4:29 PM History: 61 year old female with new onset confusion Sedation: Ativan given earlier in the day Technique: This EEG was acquired with electrodes placed according to the International 10-20 electrode system (including Fp1, Fp2, F3, F4, C3, C4, P3, P4, O1, O2, T3, T4, T5, T6, A1, A2, Fz, Cz, Pz). The following electrodes were missing or displaced: none. Background: There is a posterior dominant rhythm of 10 Hz which is better seen on the right than the left.  Sleep structures are observed, better seen on the right than the left.  She has lateralized periodic discharges(LPDs) with a frequency of 0.5 to 1 Hz with delta wave morphology seen with a wide field in the left hemisphere, maximal in the frontotemporal regions. There eare also sharp waves seen at T7 > F7 > F3,Fp1. There is a period of evolution consistent with an ictal discharge with left frontotemporal onset lasting for 30 seconds with no clinical correlate. Photic stimulation: Physiologic driving is not performed EEG Abnormalities: 1) Brief electrographic subclinical seizure 2) LPDs with delta morphology in the left frontotemporal region without additional concerning features. 3) Left frontotemporal sharp waves Clinical Interpretation: This EEG is consistent with an area of significant cortical irritability in the left hemisphere with a pattern on the ictal-interctal continuum. One definite seizure lasting 30 seconds was recorded in the left frontotemporal region. Roland Rack, MD [facility] [phone] If 7pm- 7am, please page neurology on call as listed in [place].      Discharge Exam: Vitals:    04/05/21  1535 04/05/21 1605  BP: (!) 149/90 (!) 160/101  Pulse: 85   Resp: 19 17  Temp:    SpO2: 97%    Vitals:   04/05/21 0900 04/05/21 1000 04/05/21 1535 04/05/21 1605  BP: (!) 149/94 (!) 129/100 (!) 149/90 (!) 160/101  Pulse: 99 92 85   Resp: 18 17 19 17   Temp: 97.9 F (36.6 C)     TempSrc: Oral     SpO2: 100% 98% 97%   Weight:      Height:        General: Not in acute distress. Thin body habitus, dry mucous membrane HEENT:       Eyes: PERRL, EOMI, no scleral icterus.       ENT: No discharge from the ears and nose       Neck: No JVD, no bruit, no mass felt. Heme: No neck lymph node enlargement. Cardiac: S1/S2, RRR, No murmurs, No gallops or rubs. Respiratory: No rales, wheezing, rhonchi or rubs. GI: Soft, nondistended, nontender, no organomegaly, BS present. S/p of colostomy GU: No hematuria Ext: No pitting leg edema bilaterally. 1+DP/PT pulse bilaterally. Musculoskeletal: No joint deformities, No joint redness or warmth, no limitation of ROM in spin. Skin: No rashes.  Neuro: Confused, not orientated x3, has right-sided weakness Psych: Patient is not psychotic, no suicidal or hemocidal ideation.    The results of significant diagnostics from this hospitalization (including imaging, microbiology, ancillary and laboratory) are listed below for reference.     Microbiology: Recent Results (from the past 240 hour(s))  Resp Panel by RT-PCR (Flu A&B, Covid) Nasopharyngeal Swab     Status: Abnormal   Collection Time: 04/05/21  8:25 AM   Specimen: Nasopharyngeal Swab; Nasopharyngeal(NP) swabs in vial transport medium  Result Value Ref Range Status   SARS Coronavirus 2 by RT PCR POSITIVE (A) NEGATIVE Final    Comment: (NOTE) SARS-CoV-2 target nucleic acids are DETECTED.  The SARS-CoV-2 RNA is generally detectable in upper respiratory specimens during the acute phase of infection. Positive results are indicative of the presence of the identified virus, but do not  rule out bacterial infection or co-infection with other pathogens not detected by the test. Clinical correlation with patient history and other diagnostic information is necessary to determine patient infection status. The expected result is Negative.  Fact Sheet for Patients: EntrepreneurPulse.com.au  Fact Sheet for Healthcare Providers: IncredibleEmployment.be  This test is not yet approved or cleared by the [place] FDA and  has been authorized for detection and/or diagnosis of SARS-CoV-2 by FDA under an Emergency Use Authorization (EUA).  This EUA will remain in effect (meaning this test can be used) for the duration of  the COVID-19 declaration under Section 564(b)(1) of the A ct, 21 U.S.C. section 360bbb-3(b)(1), unless the authorization is terminated or revoked sooner.     Influenza A by PCR NEGATIVE NEGATIVE Final   Influenza B by PCR NEGATIVE NEGATIVE Final    Comment: (NOTE) The Xpert Xpress SARS-CoV-2/FLU/RSV plus assay is intended as an aid in the diagnosis of influenza from Nasopharyngeal swab specimens and should not be used as a sole basis for treatment. Nasal washings and aspirates are unacceptable for Xpert Xpress SARS-CoV-2/FLU/RSV testing.  Fact Sheet for Patients: EntrepreneurPulse.com.au  Fact Sheet for Healthcare Providers: IncredibleEmployment.be  This test is not yet approved or cleared by the [place] FDA and has been authorized for detection and/or diagnosis of SARS-CoV-2 by FDA under an Emergency Use Authorization (EUA). This EUA will remain in effect (meaning  this test can be used) for the duration of the COVID-19 declaration under Section 564(b)(1) of the Act, 21 U.S.C. section 360bbb-3(b)(1), unless the authorization is terminated or revoked.  Performed at [facility], [address] [place], [place] [address]      Labs: BNP (last 3 results) No  results for input(s): BNP in the last 8760 hours. Basic Metabolic Panel: Recent Labs  Lab 04/05/21 0824 04/05/21 1032  NA 136 138  K 3.4* 3.4*  CL 96* 99  CO2 18* 17*  GLUCOSE 460* 438*  BUN 12 12  CREATININE 0.94 0.80  CALCIUM 8.5* 8.3*  MG 1.6*  --    Liver Function Tests: Recent Labs  Lab 04/05/21 0824  AST 38  ALT 51*  ALKPHOS 176*  BILITOT 0.9  PROT 8.6*  ALBUMIN 3.3*   No results for input(s): LIPASE, AMYLASE in the last 168 hours. No results for input(s): AMMONIA in the last 168 hours. CBC: Recent Labs  Lab 04/05/21 0824  WBC 10.9*  HGB 12.0  HCT 40.2  MCV 96.9  PLT 451*   Cardiac Enzymes: Recent Labs  Lab 04/05/21 1032  CKTOTAL 212   BNP: Invalid input(s): POCBNP CBG: Recent Labs  Lab 04/05/21 1035  GLUCAP 431*   D-Dimer No results for input(s): DDIMER in the last 72 hours. Hgb A1c No results for input(s): HGBA1C in the last 72 hours. Lipid Profile No results for input(s): CHOL, HDL, LDLCALC, TRIG, CHOLHDL, LDLDIRECT in the last 72 hours. Thyroid function studies No results for input(s): TSH, T4TOTAL, T3FREE, THYROIDAB in the last 72 hours.  Invalid input(s): FREET3 Anemia work up No results for input(s): VITAMINB12, FOLATE, FERRITIN, TIBC, IRON, RETICCTPCT in the last 72 hours. Urinalysis    Component Value Date/Time   COLORURINE STRAW (A) 04/05/2021 1058   APPEARANCEUR CLEAR (A) 04/05/2021 1058   APPEARANCEUR Hazy 04/04/2011 1935   LABSPEC 1.022 04/05/2021 1058   LABSPEC >1.060 04/04/2011 1935   PHURINE 5.0 04/05/2021 1058   GLUCOSEU >=500 (A) 04/05/2021 1058   GLUCOSEU 50 mg/dL 04/04/2011 1935   HGBUR SMALL (A) 04/05/2021 1058   BILIRUBINUR NEGATIVE 04/05/2021 1058   BILIRUBINUR Negative 04/04/2011 1935   KETONESUR 80 (A) 04/05/2021 1058   PROTEINUR 30 (A) 04/05/2021 1058   NITRITE NEGATIVE 04/05/2021 1058   LEUKOCYTESUR NEGATIVE 04/05/2021 1058   LEUKOCYTESUR Negative 04/04/2011 1935   Sepsis Labs Invalid input(s):  PROCALCITONIN,  WBC,  LACTICIDVEN Microbiology Recent Results (from the past 240 hour(s))  Resp Panel by RT-PCR (Flu A&B, Covid) Nasopharyngeal Swab     Status: Abnormal   Collection Time: 04/05/21  8:25 AM   Specimen: Nasopharyngeal Swab; Nasopharyngeal(NP) swabs in vial transport medium  Result Value Ref Range Status   SARS Coronavirus 2 by RT PCR POSITIVE (A) NEGATIVE Final    Comment: (NOTE) SARS-CoV-2 target nucleic acids are DETECTED.  The SARS-CoV-2 RNA is generally detectable in upper respiratory specimens during the acute phase of infection. Positive results are indicative of the presence of the identified virus, but do not rule out bacterial infection or co-infection with other pathogens not detected by the test. Clinical correlation with patient history and other diagnostic information is necessary to determine patient infection status. The expected result is Negative.  Fact Sheet for Patients: EntrepreneurPulse.com.au  Fact Sheet for Healthcare Providers: IncredibleEmployment.be  This test is not yet approved or cleared by the [place] FDA and  has been authorized for detection and/or diagnosis of SARS-CoV-2 by FDA under an Emergency Use Authorization (  EUA).  This EUA will remain in effect (meaning this test can be used) for the duration of  the COVID-19 declaration under Section 564(b)(1) of the A ct, 21 U.S.C. section 360bbb-3(b)(1), unless the authorization is terminated or revoked sooner.     Influenza A by PCR NEGATIVE NEGATIVE Final   Influenza B by PCR NEGATIVE NEGATIVE Final    Comment: (NOTE) The Xpert Xpress SARS-CoV-2/FLU/RSV plus assay is intended as an aid in the diagnosis of influenza from Nasopharyngeal swab specimens and should not be used as a sole basis for treatment. Nasal washings and aspirates are unacceptable for Xpert Xpress SARS-CoV-2/FLU/RSV testing.  Fact Sheet for  Patients: EntrepreneurPulse.com.au  Fact Sheet for Healthcare Providers: IncredibleEmployment.be  This test is not yet approved or cleared by the [place] FDA and has been authorized for detection and/or diagnosis of SARS-CoV-2 by FDA under an Emergency Use Authorization (EUA). This EUA will remain in effect (meaning this test can be used) for the duration of the COVID-19 declaration under Section 564(b)(1) of the Act, 21 U.S.C. section 360bbb-3(b)(1), unless the authorization is terminated or revoked.  Performed at [facility], [address] [place], [place] [address]     Time coordinating discharge:  25 minutes.   SIGNED:  Ivor Costa, MD [facility] 04/05/2021, 6:05 PM   If 7PM-7AM, please contact night-coverage [email or web address]

## 2021-04-05 NOTE — ED Notes (Signed)
Pt unable to sign for transfer. No family available or at the bedside to sign for pt.

## 2021-04-05 NOTE — Procedures (Signed)
History: 61 year old female with new onset confusion  Sedation: Ativan given earlier in the day  Technique: This EEG was acquired with electrodes placed according to the International 10-20 electrode system (including Fp1, Fp2, F3, F4, C3, C4, P3, P4, O1, O2, T3, T4, T5, T6, A1, A2, Fz, Cz, Pz). The following electrodes were missing or displaced: none.   Background: There is a posterior dominant rhythm of 10 Hz which is better seen on the right than the left.  Sleep structures are observed, better seen on the right than the left.  She has lateralized periodic discharges(LPDs) with a frequency of 0.5 to 1 Hz with delta wave morphology seen with a wide field in the left hemisphere, maximal in the frontotemporal regions. There eare also sharp waves seen at T7 > F7 > F3,Fp1. There is a period of evolution consistent with an ictal discharge with left frontotemporal onset lasting for 30 seconds with no clinical correlate.    Photic stimulation: Physiologic driving is not performed  EEG Abnormalities: 1) Brief electrographic subclinical seizure 2) LPDs with delta morphology in the left frontotemporal region without additional concerning features.  3) Left frontotemporal sharp waves  Clinical Interpretation: This EEG is consistent with an area of significant cortical irritability in the left hemisphere with a pattern on the ictal-interctal continuum. One definite seizure lasting 30 seconds was recorded in the left frontotemporal region.    Roland Rack, MD [facility] [phone]  If 7pm- 7am, please page neurology on call as listed in [place].

## 2021-04-05 NOTE — ED Provider Notes (Signed)
[facility] Provider Note    Event Date/Time   First MD Initiated Contact with Patient 04/05/21 [phone]     (approximate)  History   Chief Complaint: Altered mental status, fall HPI  Brittany ERTL is a 61 y.o. female with a past medical history of diabetes, hypertension, lupus, prior CVA with right-sided deficits per EMS report presents to the emergency department after being found down on the ground this morning.  According to EMS they were called to the patient's residence by the daughter after the patient was found down on the ground unclear if she fell.  EMS states the house was extremely filthy, the patient appeared to be in a soiled diaper she has an ostomy with no collection bag in place, daughter did not know which medications the patient was on.  EMS states they witnessed what could be a partial seizure, gave the patient a total of 4 mg of intranasal Versed in route to the hospital.  Here the patient is somnolent, she does somewhat look to her left but will attempt to answer questions when asked she does have slurred speech however she has been given Versed prior to arrival.  Is not clear what her baseline is either.  Physical Exam   Triage Vital Signs: ED Triage Vitals [04/05/21 0822]  Enc Vitals Group     BP      Pulse      Resp      Temp      Temp src      SpO2      Weight 90 lb 2.7 oz (40.9 kg)     Height 5' (1.524 m)     Head Circumference      Peak Flow      Pain Score Asleep     Pain Loc      Pain Edu?      Excl. in [place]?     Most recent vital signs: There were no vitals filed for this visit.  General: Patient is awake, no acute distress.  Attempts to answer questions when asked but slurred largely unintelligible speech.  Is not following commands. CV:  Good peripheral perfusion.  Regular rate and rhythm  Resp:  Normal effort.  Equal breath sounds bilaterally.  Abd:  Soft abdomen.  Ostomy with no collection bag.   ED Results /  Procedures / Treatments   EKG  EKG viewed and interpreted by myself shows a sinus rhythm at 99 bpm with a narrow QRS, normal axis, QTc prolongation otherwise normal intervals.  Nonspecific ST changes without ST elevation.  RADIOLOGY  I have personally reviewed the CT images of the head, no acute findings on my evaluation. Radiology is read the CT scan as negative.  No acute cervical fracture.   MEDICATIONS ORDERED IN ED: Medications - No data to display   IMPRESSION / MDM / [place] / ED COURSE  I reviewed the triage vital signs and the nursing notes.  Patient presents to the emergency department after being found down at home.  Patient appears altered possibly postictal although no known seizure history, but per EMS daughter could not provide much history of any kind is not sure which medication she takes or her past medical history.  Currently awaiting family arrival for further information.  Will be noticed the patient was found down at the house which appears to be poor living conditions.  We will contact APS.  We will check labs, CT scan of the  head.  We will IV hydrate, check a urine sample.  We will continue to closely monitor in the emergency department while awaiting results.  Patient's work-up is shown an overall normal CBC with no significant findings.  Patient is hyperglycemic on her chemistry with an elevated anion gap.  However VBG pH is 7.36.  Patient's anion gap could be related more to dehydration.  We will continue with IV hydration.  We will dose 10 units of IV insulin.  I spoke to the patient's daughter on the phone who states normally the patient is able to walk with use of a cane and is able to cook and clean over the past several days has been more more debilitated and confused at times.  Patient's urinalysis shows ketones but no sign of infection.  Patient receiving IV fluids.  Patient's COVID test has resulted positive this is very likely the cause of the  patient's worsening weakness and confusion recently.  Patient admitted to the hospital service for further work-up and treatment.  FINAL CLINICAL IMPRESSION(S) / ED DIAGNOSES   Altered mental status COVID-19  Note:  This document was prepared using Dragon voice recognition software and may include unintentional dictation errors.   Harvest Dark, MD 04/05/21 [phone]

## 2021-04-05 NOTE — ED Notes (Signed)
Pt found on the floor at approximately 1800, when she was calling out by yelling. Pt denies hitting head. No injuries noted. Pt placed back in bed; brief changed; colostomy bag replaced. Pt able to move all extremities. Dr. Jacqualine Code to bedside for evaluation. Dr. Blaine Hamper notified by this RN. Charge RN Sam notified. VS as follows:  158/85 BP 100% on RA 67 HR   Pt still refusing to keep cardiac leads on. This RN able to get the pulse ox monitor & BP cuff back on. Seizure pads remain in place. New yellow fall risk band placed on pt & yellow fall risk socks re-applied. This RN remains at [facility] to monitor pt.  This RN attempted to obtain new PIV access without success.  CT tech attempted x2 to obtain PIV access without success.

## 2021-04-05 NOTE — ED Notes (Signed)
MD at bedside. 

## 2021-04-05 NOTE — ED Notes (Signed)
Pt is poor condition and severely disheveled on arrival to ED. Pt has a colostomy with no bag connected to it resulting in stool sitting on her abd region which is also severely excoriated. Pt has redness to the lower half of her abd. Pt also had a poorly soiled gauze bandage covering a healing scar on the middle of her abd region. Pt cleaned up by this Probation officer and Kyra Searles, [place], EMT student.

## 2021-04-05 NOTE — ED Notes (Signed)
Pt transported back to room from MRI via stretcher with MRI tech.

## 2021-04-05 NOTE — Progress Notes (Signed)
Eeg done 

## 2021-04-05 NOTE — ED Notes (Signed)
Bedside EEG being performed.

## 2021-04-05 NOTE — ED Notes (Signed)
This RN attempted to call report to receiving RN at [facility] ([place], room 23); no answer when call transferred to RN. Phone number for report [phone]. Coordinator Ruby.
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
## 2021-04-06 ENCOUNTER — Inpatient Hospital Stay (HOSPITAL_COMMUNITY): Payer: Medicare Other

## 2021-04-06 ENCOUNTER — Encounter (HOSPITAL_COMMUNITY): Payer: Self-pay | Admitting: Internal Medicine

## 2021-04-06 DIAGNOSIS — I10 Essential (primary) hypertension: Secondary | ICD-10-CM

## 2021-04-06 DIAGNOSIS — E139 Other specified diabetes mellitus without complications: Secondary | ICD-10-CM

## 2021-04-06 DIAGNOSIS — Z90411 Acquired partial absence of pancreas: Secondary | ICD-10-CM

## 2021-04-06 DIAGNOSIS — R778 Other specified abnormalities of plasma proteins: Secondary | ICD-10-CM

## 2021-04-06 DIAGNOSIS — E1110 Type 2 diabetes mellitus with ketoacidosis without coma: Secondary | ICD-10-CM

## 2021-04-06 DIAGNOSIS — D72829 Elevated white blood cell count, unspecified: Secondary | ICD-10-CM

## 2021-04-06 DIAGNOSIS — R569 Unspecified convulsions: Secondary | ICD-10-CM

## 2021-04-06 DIAGNOSIS — U071 COVID-19: Secondary | ICD-10-CM

## 2021-04-06 DIAGNOSIS — G2581 Restless legs syndrome: Secondary | ICD-10-CM

## 2021-04-06 DIAGNOSIS — Z8673 Personal history of transient ischemic attack (TIA), and cerebral infarction without residual deficits: Secondary | ICD-10-CM

## 2021-04-06 DIAGNOSIS — F339 Major depressive disorder, recurrent, unspecified: Secondary | ICD-10-CM

## 2021-04-06 DIAGNOSIS — Z90410 Acquired total absence of pancreas: Secondary | ICD-10-CM

## 2021-04-06 DIAGNOSIS — J449 Chronic obstructive pulmonary disease, unspecified: Secondary | ICD-10-CM

## 2021-04-06 DIAGNOSIS — E891 Postprocedural hypoinsulinemia: Secondary | ICD-10-CM

## 2021-04-06 DIAGNOSIS — Z933 Colostomy status: Secondary | ICD-10-CM

## 2021-04-06 DIAGNOSIS — E876 Hypokalemia: Secondary | ICD-10-CM

## 2021-04-06 LAB — BASIC METABOLIC PANEL
Anion gap: 18 — ABNORMAL HIGH (ref 5–15)
Anion gap: 13 (ref 5–15)
Anion gap: 11 (ref 5–15)
Anion gap: 10 (ref 5–15)
Anion gap: 12 (ref 5–15)
BUN: 7 mg/dL — ABNORMAL LOW (ref 8–23)
BUN: <5 mg/dL — ABNORMAL LOW (ref 8–23)
BUN: 5 mg/dL — ABNORMAL LOW (ref 8–23)
BUN: <5 mg/dL — ABNORMAL LOW (ref 8–23)
BUN: <5 mg/dL — ABNORMAL LOW (ref 8–23)
CO2: 19 mmol/L — ABNORMAL LOW (ref 22–32)
CO2: 25 mmol/L (ref 22–32)
CO2: 27 mmol/L (ref 22–32)
CO2: 28 mmol/L (ref 22–32)
CO2: 26 mmol/L (ref 22–32)
Calcium: 7.6 mg/dL — ABNORMAL LOW (ref 8.9–10.3)
Calcium: 7.9 mg/dL — ABNORMAL LOW (ref 8.9–10.3)
Calcium: 7.9 mg/dL — ABNORMAL LOW (ref 8.9–10.3)
Calcium: 7.9 mg/dL — ABNORMAL LOW (ref 8.9–10.3)
Calcium: 7.8 mg/dL — ABNORMAL LOW (ref 8.9–10.3)
Chloride: 99 mmol/L (ref 98–111)
Chloride: 101 mmol/L (ref 98–111)
Chloride: 99 mmol/L (ref 98–111)
Chloride: 99 mmol/L (ref 98–111)
Chloride: 99 mmol/L (ref 98–111)
Creatinine, Ser: 0.74 mg/dL (ref 0.44–1.00)
Creatinine, Ser: 0.61 mg/dL (ref 0.44–1.00)
Creatinine, Ser: 0.53 mg/dL (ref 0.44–1.00)
Creatinine, Ser: 0.43 mg/dL — ABNORMAL LOW (ref 0.44–1.00)
Creatinine, Ser: 0.46 mg/dL (ref 0.44–1.00)
GFR, Estimated: >60 mL/min (ref >60)
GFR, Estimated: >60 mL/min (ref >60)
GFR, Estimated: >60 mL/min (ref >60)
GFR, Estimated: >60 mL/min (ref >60)
GFR, Estimated: >60 mL/min (ref >60)
Glucose, Bld: 282 mg/dL — ABNORMAL HIGH (ref 70–99)
Glucose, Bld: 164 mg/dL — ABNORMAL HIGH (ref 70–99)
Glucose, Bld: 139 mg/dL — ABNORMAL HIGH (ref 70–99)
Glucose, Bld: 174 mg/dL — ABNORMAL HIGH (ref 70–99)
Glucose, Bld: 98 mg/dL (ref 70–99)
Potassium: 3.2 mmol/L — ABNORMAL LOW (ref 3.5–5.1)
Potassium: 3.1 mmol/L — ABNORMAL LOW (ref 3.5–5.1)
Potassium: 3.5 mmol/L (ref 3.5–5.1)
Potassium: 3 mmol/L — ABNORMAL LOW (ref 3.5–5.1)
Potassium: 2.9 mmol/L — ABNORMAL LOW (ref 3.5–5.1)
Sodium: 136 mmol/L (ref 135–145)
Sodium: 139 mmol/L (ref 135–145)
Sodium: 137 mmol/L (ref 135–145)
Sodium: 137 mmol/L (ref 135–145)
Sodium: 137 mmol/L (ref 135–145)

## 2021-04-06 LAB — GLUCOSE, CAPILLARY
Glucose-Capillary: 100 mg/dL — ABNORMAL HIGH (ref 70–99)
Glucose-Capillary: 101 mg/dL — ABNORMAL HIGH (ref 70–99)
Glucose-Capillary: 106 mg/dL — ABNORMAL HIGH (ref 70–99)
Glucose-Capillary: 111 mg/dL — ABNORMAL HIGH (ref 70–99)
Glucose-Capillary: 120 mg/dL — ABNORMAL HIGH (ref 70–99)
Glucose-Capillary: 145 mg/dL — ABNORMAL HIGH (ref 70–99)
Glucose-Capillary: 173 mg/dL — ABNORMAL HIGH (ref 70–99)
Glucose-Capillary: 178 mg/dL — ABNORMAL HIGH (ref 70–99)
Glucose-Capillary: 205 mg/dL — ABNORMAL HIGH (ref 70–99)
Glucose-Capillary: 237 mg/dL — ABNORMAL HIGH (ref 70–99)
Glucose-Capillary: 291 mg/dL — ABNORMAL HIGH (ref 70–99)
Glucose-Capillary: 95 mg/dL (ref 70–99)

## 2021-04-06 LAB — CBC
HCT: 34 % — ABNORMAL LOW (ref 36.0–46.0)
Hemoglobin: 11.2 g/dL — ABNORMAL LOW (ref 12.0–15.0)
MCH: 29.9 pg (ref 26.0–34.0)
MCHC: 32.9 g/dL (ref 30.0–36.0)
MCV: 90.9 fL (ref 80.0–100.0)
Platelets: 408 10*3/uL — ABNORMAL HIGH (ref 150–400)
RBC: 3.74 MIL/uL — ABNORMAL LOW (ref 3.87–5.11)
RDW: 16.4 % — ABNORMAL HIGH (ref 11.5–15.5)
WBC: 9.4 10*3/uL (ref 4.0–10.5)
nRBC: 0.2 % (ref 0.0–0.2)

## 2021-04-06 LAB — BETA-HYDROXYBUTYRIC ACID
Beta-Hydroxybutyric Acid: 6.14 mmol/L — ABNORMAL HIGH (ref 0.05–0.27)
Beta-Hydroxybutyric Acid: 1.2 mmol/L — ABNORMAL HIGH (ref 0.05–0.27)
Beta-Hydroxybutyric Acid: 0.38 mmol/L — ABNORMAL HIGH (ref 0.05–0.27)

## 2021-04-06 MED ORDER — LEVETIRACETAM IN NACL 500 MG/100ML IV SOLN
500.0000 mg | Freq: Two times a day (BID) | INTRAVENOUS | Status: DC
Start: 2021-04-06 — End: 2021-04-09
  Administered 2021-04-06 – 2021-04-09 (×8): 500 mg via INTRAVENOUS
  Filled 2021-04-06 (×8): qty 100

## 2021-04-06 MED ORDER — LORAZEPAM 2 MG/ML IJ SOLN
1.0000 mg | INTRAMUSCULAR | Status: DC | PRN
  Administered 2021-04-06 – 2021-04-19 (×19): 1 mg via INTRAVENOUS
  Filled 2021-04-06 (×20): qty 1

## 2021-04-06 MED ORDER — LORAZEPAM 2 MG/ML IJ SOLN
2.0000 mg | Freq: Four times a day (QID) | INTRAMUSCULAR | Status: DC | PRN
  Administered 2021-04-06 – 2021-05-02 (×5): 2 mg via INTRAVENOUS
  Filled 2021-04-06 (×5): qty 1

## 2021-04-06 MED ORDER — POTASSIUM CHLORIDE 10 MEQ/100ML IV SOLN
10.0000 meq | INTRAVENOUS | Status: AC
  Administered 2021-04-06 (×2): 10 meq via INTRAVENOUS
  Filled 2021-04-06 (×2): qty 100

## 2021-04-06 MED ORDER — MAGNESIUM SULFATE 2 GM/50ML IV SOLN
2.0000 g | Freq: Once | INTRAVENOUS | Status: AC
  Administered 2021-04-06: 2 g via INTRAVENOUS
  Filled 2021-04-06: qty 50

## 2021-04-06 MED ORDER — INSULIN GLARGINE-YFGN 100 UNIT/ML ~~LOC~~ SOLN
5.0000 [IU] | Freq: Every day | SUBCUTANEOUS | Status: DC
  Administered 2021-04-06 – 2021-04-09 (×4): 5 [IU] via SUBCUTANEOUS
  Filled 2021-04-06 (×5): qty 0.05

## 2021-04-06 MED ORDER — ALBUTEROL SULFATE HFA 108 (90 BASE) MCG/ACT IN AERS: 1.0000 | INHALATION_SPRAY | RESPIRATORY_TRACT | Status: DC | PRN

## 2021-04-06 MED ORDER — INSULIN ASPART 100 UNIT/ML IJ SOLN
0.0000 [IU] | INTRAMUSCULAR | Status: DC
  Administered 2021-04-06: 2 [IU] via SUBCUTANEOUS
  Administered 2021-04-07: 3 [IU] via SUBCUTANEOUS
  Administered 2021-04-07 (×2): 1 [IU] via SUBCUTANEOUS
  Administered 2021-04-07: 5 [IU] via SUBCUTANEOUS
  Administered 2021-04-07: 2 [IU] via SUBCUTANEOUS
  Administered 2021-04-07: 1 [IU] via SUBCUTANEOUS
  Administered 2021-04-08: 7 [IU] via SUBCUTANEOUS
  Administered 2021-04-08 (×2): 2 [IU] via SUBCUTANEOUS
  Administered 2021-04-08: 5 [IU] via SUBCUTANEOUS
  Administered 2021-04-08: 3 [IU] via SUBCUTANEOUS
  Administered 2021-04-09 (×2): 1 [IU] via SUBCUTANEOUS
  Administered 2021-04-09: 3 [IU] via SUBCUTANEOUS
  Administered 2021-04-09: 2 [IU] via SUBCUTANEOUS
  Administered 2021-04-09 (×2): 9 [IU] via SUBCUTANEOUS
  Administered 2021-04-09: 5 [IU] via SUBCUTANEOUS
  Administered 2021-04-10 (×2): 2 [IU] via SUBCUTANEOUS

## 2021-04-06 MED ORDER — DEXTROSE IN LACTATED RINGERS 5 % IV SOLN: INTRAVENOUS | Status: DC

## 2021-04-06 NOTE — Assessment & Plan Note (Addendum)
DKA resolved.  Hemoglobin A1c 15.0 consistent with noncompliance. Now transitioned to subcu insulin.   --Diabetic educator following, appreciate assistance --Insulin as below

## 2021-04-06 NOTE — Progress Notes (Signed)
Inpatient Diabetes Program Recommendations  AACE/ADA: New Consensus Statement on Inpatient Glycemic Control   Target Ranges:  Prepandial:   less than 140 mg/dL      Peak postprandial:   less than 180 mg/dL (1-2 hours)      Critically ill patients:  140 - 180 mg/dL    Latest Reference Range & Units 04/06/21 00:22 04/06/21 03:07 04/06/21 04:05 04/06/21 05:32  Glucose-Capillary 70 - 99 mg/dL 291 (H) 237 (H) 205 (H) 173 (H)    Latest Reference Range & Units 04/05/21 10:35  Glucose-Capillary 70 - 99 mg/dL 431 (H)    Latest Reference Range & Units 04/05/21 08:24  CO2 22 - 32 mmol/L 18 (L)  Glucose 70 - 99 mg/dL 460 (H)  Anion gap 5 - 15  22 (H)   Review of Glycemic Control  Diabetes history: DM2 Outpatient Diabetes medications: 70/30 30 units QAM with breakfast Current orders for Inpatient glycemic control: IV insulin  Inpatient Diabetes Program Recommendations:    Insulin: IV insulin should be continued until acidosis has completely resolved. Once acidosis resolved and provider ready to transition from IV to SQ insulin, please consider ordering Semglee 5 units Q24H, CBGs Q4H, and Novolog 0-9 units Q4H.  NOTE: Patient has DM2 hx admitted with acute metabolic encephalopathy, seizures, DKA, and COVID+. Initial lab glucose 460 mg/dl on 04/05/21. IV insulin was started for DKA on 04/06/21 at 1:39 am. In reviewing chart, noted patient recently inpatient at 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   Thanks, Barnie Alderman, RN, MSN, CDE Diabetes Coordinator Inpatient Diabetes Program [phone] (Team Pager from 8am to 5pm)

## 2021-04-06 NOTE — Progress Notes (Signed)
HOSPITAL MEDICINE OVERNIGHT EVENT NOTE    Nursing reports that patient continues to exhibit significant confusion throughout the evening, pulling at medical hardware including telemetry leads, impeding medical care.  This will prove exceedingly problematic considering patient's EEG monitoring.    Attempting to avoid pharmacologic anxiolytic/sedating agents due to ongoing EEG monitoring.  We attempted to use mitts but unfortunately this failed to be effective in preventing the patient from removing medical hardware.  We will unfortunately have to temporarily place patient in soft point restraints for the benefit of patient care.  These will be discontinued as soon as we are able.  Vernelle Emerald  MD [facility]

## 2021-04-06 NOTE — Assessment & Plan Note (Addendum)
Improved since admission, but seems to be waxing and waning.   Likely complicated by hospital-acquired delirium and fluctuations in glucose due to noncompliance with diet.  --Continue PT/OT efforts while inpatient --Plan discharge to SNF for rehab and then may need to live with her sister as she is likely unable to manage her ADLs at home alone.

## 2021-04-06 NOTE — Assessment & Plan Note (Addendum)
Stable

## 2021-04-06 NOTE — Progress Notes (Signed)
Subjective: No clinical seizure. Continues be confused  ROS: Unable to obtain due to poor mental status  Examination  Vital signs in last 24 hours: Temp:  [97.4 F (36.3 C)-97.8 F (36.6 C)] 97.5 F (36.4 C) (02/23 0746) Pulse Rate:  [67-75] 75 (02/22 2245) Resp:  [17] 17 (02/22 2245) BP: (128-171)/(84-108) 168/98 (02/23 0746) SpO2:  [92 %-93 %] 92 % (02/23 0746) Weight:  [40.9 kg] 40.9 kg (02/23 0414)  General: lying in bed, NAD CVS: pulse-normal rate and rhythm RS: breathing comfortably Extremities: normal  Neuro:awake,doesn't follow commands, doesn't name objects, garbled word salad, PERLA, blinks to threat bilaterally, able to track examiner, moving all extremities in bed but right less than left  Basic Metabolic Panel: Recent Labs  Lab 04/05/21 0824 04/05/21 1032 04/06/21 0112 04/06/21 0429 04/06/21 0809 04/06/21 1221  NA 136 138 136 139 137 137  K 3.4* 3.4* 3.2* 3.1* 3.5 3.0*  CL 96* 99 99 101 99 99  CO2 18* 17* 19* 25 27 28   GLUCOSE 460* 438* 282* 164* 139* 174*  BUN 12 12 7* <5* 5* <5*  CREATININE 0.94 0.80 0.74 0.61 0.53 0.43*  CALCIUM 8.5* 8.3* 7.6* 7.9* 7.9* 7.9*  MG 1.6*  --   --   --   --   --     CBC: Recent Labs  Lab 04/05/21 0824 04/06/21 0429  WBC 10.9* 9.4  HGB 12.0 11.2*  HCT 40.2 34.0*  MCV 96.9 90.9  PLT 451* 408*     Coagulation Studies: Recent Labs    04/05/21 0824  LABPROT 13.4  INR 1.0    Imaging  MR Brain wo control 04/05/2021:  1. Prematurely terminated, significantly motion degraded and limited examination as described. 2. The diffusion-weighted imaging is of good quality. No evidence of acute infarct. 3. Within described limitations, no acute intracranial abnormality is identified. 4. Known small chronic cortical/subcortical right MCA territory infarcts within the mid-to-posterior right frontal lobe. 5. Known large chronic cortical/subcortical left PCA territory infarct within the left temporal and occipital  lobes. 6. Background moderate chronic small vessel ischemic changes within the cerebral white matter. 7. Known large chronic infarct within the left cerebellar hemisphere. 8. Mild mucosal thickening within the paranasal sinuses.  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     New onset epilepsy COVID - No seizures overnight  Recommendations - continue keppra 500mg   BID - Continue ltm eeg till tomorrow. Can consider dc if no seizures  overnight - if any sz, can increase keppra. If frequent seizures, can load with fosphenytoin - likely prolonged aphasia due to stroke and seizure. However, if aphasia persists, will consider loading with fosphenytoin - seizure precautions - PRN Iv ativan for clinical sz - management of rest of comorbidity per peri team  I have spent a total of  36  minutes with the patient reviewing hospital notes,  test results, labs and examining the patient as well as establishing an assessment and plan.  > 50% of time was spent in direct patient care.     Zeb Comfort Epilepsy [facility] For questions after 5pm please refer to AMION to reach the Neurologist on call

## 2021-04-06 NOTE — Procedures (Addendum)
Patient Name: Brittany [name]  MRN: [id number]  Epilepsy Attending: Lora Havens  Referring Physician/Provider: Amie Portland, MD Duration: 04/05/2021 2336 to 04/06/2021 2336  Patient history: 61 year old female with new onset confusion and was found to have seizures on routine EEG.  LTM EEG to look for intermittent seizures.  Level of alertness: Awake, asleep  AEDs during EEG study: LEV  Technical aspects: This EEG study was done with scalp electrodes positioned according to the 10-20 International system of electrode placement. Electrical activity was acquired at a sampling rate of 500Hz  and reviewed with a high frequency filter of 70Hz  and a low frequency filter of 1Hz . EEG data were recorded continuously and digitally stored.   Description: The posterior dominant rhythm consists of 9-10 Hz activity of moderate voltage (25-35 uV) seen predominantly in posterior head regions, symmetric and reactive to eye opening and eye closing. Sleep was characterized by vertex waves, sleep spindles (12 to 14 Hz), maximal frontocentral region.  EEG also showed intermittent 3 to 5 Hz theta -delta slowing as well as lateralized periodic discharges were noted in left hemisphere, maximal left temporal region with frequent 0.5 to 1 Hz which appears rhythmic at times. Hyperventilation and photic stimulation were not performed.     ABNORMALITY - Lateralized periodic discharges, left hemisphere, maximal left temporal region - Intermittent slow, left hemisphere, maximal left temporal region  IMPRESSION:  This study showed evidence of epileptogenicity as well as cortical dysfunction arising from left hemisphere, maximal left temporal region likely due to underlying structural abnormality. Of note, this EEG pattern is on the ictal-interictal continuum with low potential for seizure recurrence. No definite seizures were seen throughout the recording.  Sagal Gayton [name]

## 2021-04-06 NOTE — Progress Notes (Addendum)
Initial Nutrition Assessment  DOCUMENTATION CODES:  Severe malnutrition in context of chronic illness, Underweight  INTERVENTION:  If diet unable to be advanced within 24-48 hours, recommend placement of Cortrak/NGT for initiation of TF. Consider: -Osmolite 1.2 @ 63ml/hr, advance 49ml/hr Q12H until goal rate of 77ml/hr (138ml/day) is reached  At goal, TF will provide 1584 kcals, 73 grams protein, 1044ml free water  Monitor magnesium, potassium, and phosphorus BID for at least 3 days, MD to replete as needed, as pt is at risk for refeeding syndrome given severe malnutrition and electrolyte abnormalities.   NUTRITION DIAGNOSIS:  Severe Malnutrition related to chronic illness (cancer, lupus) as evidenced by severe muscle depletion, severe fat depletion, percent weight loss.  GOAL:  Patient will meet greater than or equal to 90% of their needs  MONITOR:  Diet advancement, Labs, Weight trends, Skin, I & O's  REASON FOR ASSESSMENT:  Malnutrition Screening Tool    ASSESSMENT:  Pt with PMH significant for DM, HTN, lupus, Ca, CVA w/ R-sided weakness, COPD, and s/p pancreatectomy and splenectomy admitted with acute metabolic encephalopathy and DKA. EEG noted subclinical seizure.  Pt was transferred [facility] for continuous EEG monitoring. Per RN, pt has been confused/agitated since arrival. RD unable to obtain diet/weight history from pt at this time.   Reviewed weight history. Pt 40.9 kg on admit and 49 kg on 10/25/20, indicating a clinically significant 16.5% weight loss x5 months.  Pt remains NPO. . If diet unable to be advanced within 24-48 hours, recommend placement of Cortrak/NGT for initiation of TF.   Medications: Scheduled Meds:  atorvastatin  40 mg Oral QHS   benazepril  10 mg Oral Daily   enoxaparin (LOVENOX) injection  20 mg Subcutaneous Q24H   feeding supplement (GLUCERNA SHAKE)  237 mL Oral TID BM   insulin aspart  0-9 Units Subcutaneous Q4H   insulin glargine-yfgn  5  Units Subcutaneous QHS   lipase/protease/amylase  72,000 Units Oral TID with meals  Continuous Infusions:  dextrose 5% lactated ringers 125 mL/hr at 04/07/21 0315   feeding supplement (OSMOLITE 1.2 CAL)     levETIRAcetam 500 mg (04/07/21 1248)   Labs: Recent Labs  Lab 04/05/21 0824 04/05/21 1032 04/06/21 0809 04/06/21 1221 04/06/21 1556  NA 136   < > 137 137 137  K 3.4*   < > 3.5 3.0* 2.9*  CL 96*   < > 99 99 99  CO2 18*   < > 27 28 26   BUN 12   < > 5* <5* <5*  CREATININE 0.94   < > 0.53 0.43* 0.46  CALCIUM 8.5*   < > 7.9* 7.9* 7.8*  MG 1.6*  --   --   --   --   GLUCOSE 460*   < > 139* 174* 98   < > = values in this interval not displayed.  CBGs: 95-431 x24 hours  A1c (last taken 11/06/20) 15.3  NUTRITION - FOCUSED PHYSICAL EXAM: Flowsheet Row Most Recent Value  Orbital Region Moderate depletion  Upper Arm Region Severe depletion  Thoracic and Lumbar Region Severe depletion  Buccal Region Severe depletion  Temple Region Severe depletion  Clavicle Bone Region Severe depletion  Clavicle and Acromion Bone Region Severe depletion  Scapular Bone Region Severe depletion  Dorsal Hand Severe depletion  Patellar Region Severe depletion  Anterior Thigh Region Severe depletion  Posterior Calf Region Severe depletion  Edema (RD Assessment) None  Hair Reviewed  Eyes Reviewed  Mouth Reviewed  Skin Reviewed  Nails  Reviewed      Diet Order:   Diet Order             Diet NPO time specified  Diet effective now                  EDUCATION NEEDS:  Not appropriate for education at this time  Skin:  Skin Assessment: Skin Integrity Issues: Skin Integrity Issues:: Incisions Incisions: abdomen (from previous admit)  Last BM:  PTA  Height:  Ht Readings from Last 1 Encounters:  04/05/21 5' (1.524 m)   Weight:  Wt Readings from Last 1 Encounters:  04/07/21 40.1 kg   BMI:  Body mass index is 17.27 kg/m.  Estimated Nutritional Needs:  Kcal:  1400-1600 Protein:   70-80 grams Fluid:  >1.4L   Theone Stanley., MS, RD, LDN (she/her/hers) RD pager number and weekend/on-call pager number located in [place].

## 2021-04-06 NOTE — H&P (Signed)
History and Physical   Brittany [name] WUJ:[id number] DOB: 04-Dec-1960 DOA: 04/05/2021  PCP: Clinic, [facility]   Patient coming from: Home  Chief Complaint: Altered mental status  HPI: Brittany [name] is a 61 y.o. female with medical history significant of diverticulitis status post colostomy, pancreatic cancer status post pancreatectomy and splenectomy, hypertension, hyperlipidemia, diabetes, history of CVA, COPD, RLS, depression, anxiety, neuropathy, GERD who presented with altered mental status.  Patient was found down at home by her daughter.  Unclear how long she had been down or if she had had a fall.  Per EMS the house was filthy and patient had a soiled diaper and has an ostomy in place with no bag.  EMS witnessed an episode of possible seizure.  Received Versed in route.  Daughter later noted that she had found an old bottle of Keppra the did not know about any seizure history.  Patient does have chronic right-sided weakness from previous stroke.  Unable to perform review of systems due to patient's altered mental status.  ED Course: Vital signs in the ED at [facility] were significant for blood pressure in the 956O to 130 systolic.  Lab work-up there showed initial CMP with potassium 3.4, chloride 96, bicarb 18 with gap of 22 and 22 again on repeat, glucose 460, calcium 8.5 which improved improved when accounting for albumin 3.3, protein 8.6, ALT 51, alk phos 176.  CBC with mild leukocytosis to 10.9 and platelets 451.  Troponin flat at 21 and 22 on repeat.  CK2 12.  Beta hydroxybutyric acid elevated at 7.77, respiratory panel for flu and COVID-positive for COVID.  Urinalysis with glucose, ketones, hemoglobin, protein, rare bacteria.  UDS positive for marijuana and benzos (did receive benzo in route).  VBG with normal pH and PCO2 of 36.  Imaging studies included CT head which was negative for acute normality x2 and CT C-spine which was negative for acute abnormality x2 (2 scans done due to due  to fall patient had in the ED).  MR brain ordered and performed but a very limited study no evidence of infarct on this limited study, known chronic changes found but no acute changes.  Patient started on IV fluids and given a dose of insulin as well as a couple doses of IV potassium in the ED.  Evaluated by neurology after initial EEG showed signs of possible seizure with recommendation to transfer to [facility] for long-term monitoring.  Also was evaluated by neurosurgery due to abnormality on CT however this "rotary subluxation "had resolved on the repeat CT scan which means it is positional and per neurosurgery does not need further follow-up.  Review of Systems: Unable to perform review of systems due to patient's altered mental status.  Past Medical History:  Diagnosis Date   Cancer ([place])    Collagen vascular disease ([place])    Diabetes mellitus without complication (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 She has been exposed to  tobacco smoke. She has never used smokeless tobacco. She reports current alcohol use. She reports that she does not currently use drugs.  Allergies  Allergen Reactions   Cephalosporins Itching    TOLERATED ZOSYN (PIPERACILLIN) BEFORE   Hydromorphone Itching   Keflin [Cephalothin] Itching   Lactose Intolerance (Gi) Diarrhea    Family History  Problem Relation Age of Onset   Anxiety disorder Sister    Breast cancer Maternal Aunt   Reviewed on  admission  Prior to Admission medications   Medication Sig Start Date End Date Taking? Authorizing Provider  atorvastatin (LIPITOR) 40 MG tablet Take 1 tablet (40 mg total) by mouth Nightly. 03/14/21 04/13/21  Fritzi Mandes, MD  benazepril (LOTENSIN) 10 MG tablet Take 10 mg by mouth daily.    [provider]  diclofenac Sodium (VOLTAREN) 1 % GEL Apply 2 g topically 4 (four) times daily. 01/09/21   [provider]  feeding supplement, GLUCERNA SHAKE, (GLUCERNA SHAKE) LIQD Take 237 mLs by mouth 3 (three) times daily between meals. 12/27/20   Loletha Grayer, MD  gabapentin 50 MG TABS Take 300 mg by mouth 3 (three) times daily. 03/14/21   Fritzi Mandes, MD  insulin NPH-regular Human (NOVOLIN 70/30) (70-30) 100 UNIT/ML injection Inject 28 Units into the skin 2 (two) times daily with a meal. Patient taking differently: Inject 30 Units into the skin daily with breakfast. 08/04/19   Love, Ivan Anchors, PA-C  liver oil-zinc oxide (DESITIN) 40 % ointment Apply topically as needed for irritation. 03/14/21   Fritzi Mandes, MD  mirtazapine (REMERON) 15 MG tablet Take 1 tablet (15 mg total) by mouth at bedtime. 11/08/20 11/08/21  Pokhrel, Corrie Mckusick, MD  ondansetron (ZOFRAN-ODT) 4 MG disintegrating tablet Take 4 mg by mouth every 8 (eight) hours as needed. 02/23/21   [provider]  oxyCODONE (OXY IR/ROXICODONE) 5 MG immediate release tablet Take 1 tablet (5 mg total) by mouth every 12 (twelve) hours as needed for moderate pain or severe pain. 03/28/21   Tylene Fantasia, PA-C  Pancrelipase, Lip-Prot-Amyl, 24000-76000 units CPEP Take 3 capsules (72,000 Units total) by mouth 3 (three) times daily. Patient taking differently: Take 3 capsules by mouth 3 (three) times daily. Appears to be noncompliant 12/27/20   Loletha Grayer, MD  potassium chloride SA (KLOR-CON M) 20 MEQ tablet Take 1 tablet (20 mEq total) by mouth daily. 03/14/21 03/14/22  Fritzi Mandes, MD  rOPINIRole (REQUIP) 1 MG tablet Take 1 tablet (1  mg total) by mouth 2 (two) times daily. Patient taking differently: Take 0.5 mg by mouth at bedtime. 08/04/19   Bary Leriche, PA-C    Physical Exam: Vitals:   04/05/21 2245  BP: (!) 171/108  Pulse: 75  Resp: 17  Temp: (!) 97.4 F (36.3 C)  TempSrc: Oral  Weight: 40.9 kg  Height: 5' (1.524 m)    Physical Exam Constitutional:      Appearance: She is ill-appearing.     Comments: Thin elderly female, appears older than stated age, in mild distress.  HENT:     Head: Normocephalic and atraumatic.     Mouth/Throat:     Mouth: Mucous membranes are moist.     Pharynx: Oropharynx is clear.  Eyes:     Extraocular Movements: Extraocular movements intact.     Pupils: Pupils are equal, round, and reactive to light.  Cardiovascular:     Rate and Rhythm: Normal rate and regular rhythm.     Pulses: Normal pulses.     Heart sounds:  Normal heart sounds.  Pulmonary:     Effort: Pulmonary effort is normal. No respiratory distress.     Breath sounds: Normal breath sounds.  Abdominal:     General: Bowel sounds are normal. There is no distension.     Palpations: Abdomen is soft.     Tenderness: There is no abdominal tenderness.     Comments: Colostomy at left lower abdomen.  Large presumed surgical scar at right abdomen.  Partially opened superficially.  Musculoskeletal:        General: No swelling or deformity.  Skin:    General: Skin is warm and dry.  Neurological:     General: No focal deficit present.     Comments: Alert, largely moaning unintelligibly.  Able to make out the word "please". Does not answer questions for me.   Labs on Admission: I have personally reviewed following labs and imaging studies  CBC: Recent Labs  Lab 04/05/21 0824  WBC 10.9*  HGB 12.0  HCT 40.2  MCV 96.9  PLT 451*    Basic Metabolic Panel: Recent Labs  Lab 04/05/21 0824 04/05/21 1032  NA 136 138  K 3.4* 3.4*  CL 96* 99  CO2 18* 17*  GLUCOSE 460* 438*  BUN 12 12  CREATININE 0.94 0.80   CALCIUM 8.5* 8.3*  MG 1.6*  --     GFR: Estimated Creatinine Clearance: 47.7 mL/min (by C-G formula based on SCr of 0.8 mg/dL).  Liver Function Tests: Recent Labs  Lab 04/05/21 0824  AST 38  ALT 51*  ALKPHOS 176*  BILITOT 0.9  PROT 8.6*  ALBUMIN 3.3*    Urine analysis:    Component Value Date/Time   COLORURINE STRAW (A) 04/05/2021 1058   APPEARANCEUR CLEAR (A) 04/05/2021 1058   APPEARANCEUR Hazy 04/04/2011 1935   LABSPEC 1.022 04/05/2021 1058   LABSPEC >1.060 04/04/2011 1935   PHURINE 5.0 04/05/2021 1058   GLUCOSEU >=500 (A) 04/05/2021 1058   GLUCOSEU 50 mg/dL 04/04/2011 1935   HGBUR SMALL (A) 04/05/2021 1058   BILIRUBINUR NEGATIVE 04/05/2021 1058   BILIRUBINUR Negative 04/04/2011 1935   KETONESUR 80 (A) 04/05/2021 1058   PROTEINUR 30 (A) 04/05/2021 1058   NITRITE NEGATIVE 04/05/2021 1058   LEUKOCYTESUR NEGATIVE 04/05/2021 1058   LEUKOCYTESUR Negative 04/04/2011 1935    Radiological Exams on Admission: CT Head Wo Contrast  Result Date: 04/05/2021 CLINICAL DATA:  Status post fall. EXAM: CT HEAD WITHOUT CONTRAST TECHNIQUE: Contiguous axial images were obtained from the base of the skull through the vertex without intravenous contrast. RADIATION DOSE REDUCTION: This exam was performed according to the departmental dose-optimization program which includes automated exposure control, adjustment of the mA and/or kV according to patient size and/or use of iterative reconstruction technique. COMPARISON:  None. FINDINGS: Brain: There is mild cerebral atrophy with widening of the extra-axial spaces and ventricular dilatation. There are areas of decreased attenuation within the white matter tracts of the supratentorial brain, consistent with microvascular disease changes. Chronic right frontal, left parietooccipital and left cerebellar infarcts are seen. Vascular: No hyperdense vessel or unexpected calcification. Skull: Normal. Negative for fracture or focal lesion. Sinuses/Orbits:  No acute finding. Other: None. IMPRESSION: 1. No acute intracranial abnormality. 2. Chronic right frontal, left parietooccipital and left cerebellar infarcts. Electronically Signed   By: Virgina Norfolk M.D.   On: 04/05/2021 19:36   CT HEAD WO CONTRAST (5MM)  Result Date: 04/05/2021 CLINICAL DATA:  Head trauma, abnormal mental status (Age 50-64y); Facial trauma, blunt EXAM: CT HEAD WITHOUT CONTRAST CT  CERVICAL SPINE WITHOUT CONTRAST TECHNIQUE: Multidetector CT imaging of the head and cervical spine was performed following the standard protocol without intravenous contrast. Multiplanar CT image reconstructions of the cervical spine were also generated. RADIATION DOSE REDUCTION: This exam was performed according to the departmental dose-optimization program which includes automated exposure control, adjustment of the mA and/or kV according to patient size and/or use of iterative reconstruction technique. COMPARISON:  11/14/2019, 12/26/2020 FINDINGS: CT HEAD FINDINGS Brain: Continued evolution of known left PCA territory infarction. Additional remote left cerebellar and right frontal lobe infarcts. No evidence of acute large territory infarction. No evidence of hemorrhage, hydrocephalus, extra-axial collection, or mass. Scattered low-density changes within the periventricular and subcortical white matter compatible with chronic microvascular ischemic change. Mild diffuse cerebral volume loss. Vascular: Atherosclerotic calcifications involving the large vessels of the skull base. No unexpected hyperdense vessel. Skull: Normal. Negative for fracture or focal lesion. Sinuses/Orbits: No acute finding. Other: None. CT CERVICAL SPINE FINDINGS Alignment: Facet joints are aligned without dislocation or traumatic listhesis. Rotatory subluxation at C1-2. Patient is also side bent to the left. Unchanged grade 1 anterolisthesis of C4 on C5. Skull base and vertebrae: No acute fracture. No primary bone lesion or focal  pathologic process. Soft tissues and spinal canal: No prevertebral fluid or swelling. No visible canal hematoma. Disc levels: Degenerative disc disease most pronounced at the C5-6 and C6-7 levels. Multilevel bilateral facet arthropathy. Upper chest: Emphysematous changes within the lung apices. Other: None. IMPRESSION: 1. No acute intracranial abnormality. 2. Chronic small vessel ischemic disease and multiple chronic infarcts. 3. No acute fracture or evidence of traumatic listhesis of the cervical spine. 4. Rotatory subluxation at C1-2, which may be positional in the absence of a fixed torticollis. Electronically Signed   By: Davina Poke D.O.   On: 04/05/2021 09:37   CT Cervical Spine Wo Contrast  Result Date: 04/05/2021 CLINICAL DATA:  Status post fall. EXAM: CT CERVICAL SPINE WITHOUT CONTRAST TECHNIQUE: Multidetector CT imaging of the cervical spine was performed without intravenous contrast. Multiplanar CT image reconstructions were also generated. RADIATION DOSE REDUCTION: This exam was performed according to the departmental dose-optimization program which includes automated exposure control, adjustment of the mA and/or kV according to patient size and/or use of iterative reconstruction technique. COMPARISON:  April 05, 2021 FINDINGS: Alignment: There is approximately 1 mm anterolisthesis of the C4 vertebral body on C5, with 1 mm to 2 mm retrolisthesis of C5 vertebral body on C6. Skull base and vertebrae: No acute fracture. No primary bone lesion or focal pathologic process. Soft tissues and spinal canal: No prevertebral fluid or swelling. No visible canal hematoma. Disc levels: Mild to moderate severity endplate sclerosis and moderate severity anterior osteophyte formation are seen at the levels of C5-C6 and C6-C7. Mild intervertebral disc space narrowing is seen at C5-C6 and C6-C7. Bilateral marked severity multilevel facet joint hypertrophy is seen. Upper chest: Negative. Other: None. IMPRESSION:  1. No acute osseous abnormality of the cervical spine. 2. Mild to moderate severity degenerative changes at the levels of C5-C6 and C6-C7. 3. Approximately 1 mm anterolisthesis of the C4 vertebral body on C5, with 1 mm to 2 mm retrolisthesis of C5 vertebral body on C6. Electronically Signed   By: Virgina Norfolk M.D.   On: 04/05/2021 19:39   CT Cervical Spine Wo Contrast  Result Date: 04/05/2021 CLINICAL DATA:  Head trauma, abnormal mental status (Age 50-64y); Facial trauma, blunt EXAM: CT HEAD WITHOUT CONTRAST CT CERVICAL SPINE WITHOUT CONTRAST TECHNIQUE: Multidetector CT imaging of  the head and cervical spine was performed following the standard protocol without intravenous contrast. Multiplanar CT image reconstructions of the cervical spine were also generated. RADIATION DOSE REDUCTION: This exam was performed according to the departmental dose-optimization program which includes automated exposure control, adjustment of the mA and/or kV according to patient size and/or use of iterative reconstruction technique. COMPARISON:  11/14/2019, 12/26/2020 FINDINGS: CT HEAD FINDINGS Brain: Continued evolution of known left PCA territory infarction. Additional remote left cerebellar and right frontal lobe infarcts. No evidence of acute large territory infarction. No evidence of hemorrhage, hydrocephalus, extra-axial collection, or mass. Scattered low-density changes within the periventricular and subcortical white matter compatible with chronic microvascular ischemic change. Mild diffuse cerebral volume loss. Vascular: Atherosclerotic calcifications involving the large vessels of the skull base. No unexpected hyperdense vessel. Skull: Normal. Negative for fracture or focal lesion. Sinuses/Orbits: No acute finding. Other: None. CT CERVICAL SPINE FINDINGS Alignment: Facet joints are aligned without dislocation or traumatic listhesis. Rotatory subluxation at C1-2. Patient is also side bent to the left. Unchanged grade 1  anterolisthesis of C4 on C5. Skull base and vertebrae: No acute fracture. No primary bone lesion or focal pathologic process. Soft tissues and spinal canal: No prevertebral fluid or swelling. No visible canal hematoma. Disc levels: Degenerative disc disease most pronounced at the C5-6 and C6-7 levels. Multilevel bilateral facet arthropathy. Upper chest: Emphysematous changes within the lung apices. Other: None. IMPRESSION: 1. No acute intracranial abnormality. 2. Chronic small vessel ischemic disease and multiple chronic infarcts. 3. No acute fracture or evidence of traumatic listhesis of the cervical spine. 4. Rotatory subluxation at C1-2, which may be positional in the absence of a fixed torticollis. Electronically Signed   By: Davina Poke D.O.   On: 04/05/2021 09:37   MR BRAIN WO CONTRAST  Result Date: 04/05/2021 CLINICAL DATA:  Provided history: Seizure disorder, clinical change. Additional history provided: 3 seizures today, leftward gaze. EXAM: MRI HEAD WITHOUT CONTRAST TECHNIQUE: Multiplanar, multiecho pulse sequences of the brain and surrounding structures were obtained without intravenous contrast. COMPARISON:  Prior head CT examinations 04/05/2021 and earlier. Brain MRI 12/26/2020. FINDINGS: The patient was unable to tolerate the full examination. Only the following sequences could be acquired: Axial diffusion-weighted imaging, coronal diffusion-weighted imaging, axial T2 FLAIR sequence, axial T2 GRE sequences, axial T2 TSE sequence and axial T1 weighted sequence. The acquired imaging is motion degraded. Most notably, there is severe motion degradation of the axial T2 FLAIR sequence, severe motion degradation of the axial T2 GRE sequence, moderate motion degradation of the axial T2 TSE sequence and severe motion degradation of the axial T1 weighted sequence. Brain: The diffusion-weighted imaging is of good quality. There is no evidence of acute infarct. There is significantly limited evaluation of  the brain parenchyma on the remaining acquired sequences due to motion degradation. Known small chronic cortical/subcortical right MCA territory infarcts within the mid to posterior right frontal lobe. Known large chronic cortical/subcortical left PCA territory infarct within the left temporal and occipital lobes. Background moderate multifocal T2 FLAIR hyperintense signal abnormality within the cerebral white matter, nonspecific but compatible with chronic small vessel ischemic disease. Known large chronic infarct within the left cerebellar hemisphere. Within described limitations, no intracranial mass or extra-axial fluid collection is identified. No midline shift. No age advanced or lobar predominant atrophy. Vascular: Maintained flow voids within the proximal large arterial vessels. Skull and upper cervical spine: No focal suspicious marrow lesion is identified on the acquired sequences. Sinuses/Orbits: Visualized orbits show no acute finding. Mild  mucosal thickening within the bilateral ethmoid, right sphenoid and bilateral maxillary sinuses. IMPRESSION: 1. Prematurely terminated, significantly motion degraded and limited examination as described. 2. The diffusion-weighted imaging is of good quality. No evidence of acute infarct. 3. Within described limitations, no acute intracranial abnormality is identified. 4. Known small chronic cortical/subcortical right MCA territory infarcts within the mid-to-posterior right frontal lobe. 5. Known large chronic cortical/subcortical left PCA territory infarct within the left temporal and occipital lobes. 6. Background moderate chronic small vessel ischemic changes within the cerebral white matter. 7. Known large chronic infarct within the left cerebellar hemisphere. 8. Mild mucosal thickening within the paranasal sinuses. Electronically Signed   By: Kellie Simmering D.O.   On: 04/05/2021 12:39   EEG adult  Result Date: 04/05/2021 Greta Doom, MD     04/05/2021   4:29 PM History: 61 year old female with new onset confusion Sedation: Ativan given earlier in the day Technique: This EEG was acquired with electrodes placed according to the International 10-20 electrode system (including Fp1, Fp2, F3, F4, C3, C4, P3, P4, O1, O2, T3, T4, T5, T6, A1, A2, Fz, Cz, Pz). The following electrodes were missing or displaced: none. Background: There is a posterior dominant rhythm of 10 Hz which is better seen on the right than the left.  Sleep structures are observed, better seen on the right than the left.  She has lateralized periodic discharges(LPDs) with a frequency of 0.5 to 1 Hz with delta wave morphology seen with a wide field in the left hemisphere, maximal in the frontotemporal regions. There eare also sharp waves seen at T7 > F7 > F3,Fp1. There is a period of evolution consistent with an ictal discharge with left frontotemporal onset lasting for 30 seconds with no clinical correlate. Photic stimulation: Physiologic driving is not performed EEG Abnormalities: 1) Brief electrographic subclinical seizure 2) LPDs with delta morphology in the left frontotemporal region without additional concerning features. 3) Left frontotemporal sharp waves Clinical Interpretation: This EEG is consistent with an area of significant cortical irritability in the left hemisphere with a pattern on the ictal-interctal continuum. One definite seizure lasting 30 seconds was recorded in the left frontotemporal region. Roland Rack, MD [facility] [phone] If 7pm- 7am, please page neurology on call as listed in [place].    EKG: Independently reviewed.  Sinus rhythm at 99 bpm.  Significant baseline wander and baseline artifact.  Also low voltage in multiple leads.  Nonspecific intraventricular conduction delay with QRS of 118.  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 > Possibility of seizures as etiology remains as below.  Has been evaluated by neurology and is transferred to [facility] for long-term video monitoring. > Does have mild leukocytosis though this appears to be reactive as it is mild and no evidence of infection on urine or multiple imaging studies. > Possibility of COVID-19 contributing to encephalopathy. > Also noted to be in DKA which could also contribute though pH is normal. - Monitor on progressive unit - Treat DKA as  below - Supportive care for COVID-19 without respiratory symptoms - Seizure work-up as per neurology as below - Hold centrally acting medications including oxycodone, Remeron, gabapentin, Requip  ?Seizures > Seizure-like activity noted in route by EMS and patient received Versed. > Initial EEG at [facility] showed LPD and ictal discharges. > Recommendation for patient to be transferred to [facility] for long-term video monitoring. > Patient was ordered Keppra load at [facility] but this was unable to be given before transfer. > Neurology was aware of patient on their arrival and is reordering antiepileptics. - Appreciate neurology recommendations - Continue with Keppra per neurology - Continuous video EEG - Replete magnesium  DKA - Monitor on progressive unit - Start on insulin drip - Complete potassium supplementation, has received 20 mEq of the 40 at [facility], will order additional 20 mEq IV - LR at 125 mL/hr until CBG less than 250 - Switch to LR-1/2 NS when 1 CBG less than 250 - Nothing by mouth  - BMET every 4 hours - CBG Q1H - Once  anion gap closed 2, start CM diet and if able to eat, administer Lantus 15 units - Continue insulin drip for 1-2 more hours, then discontinue and start SSI-S  - DC fluids if eating, drinking, and off insulin drip  COVID-19 infection > No respiratory symptoms.  Saturating well. - Supportive care  COPD - As needed albuterol  Hypertension - Continue home benazepril  Hypokalemia Hypomagnesemia > Potassium noted to be low at 3.4 in the ED with magnesium of 1.6. - Continue with a total of 40 mEq IV potassium - Continue with 2 g IV magnesium - Trend electrolytes  History of CVA Hyperlipidemia - Continue home atorvastatin  Depression - Holding home centrally acting medications including Remeron  Abnormal CT > Noted to have rotary subluxation of C1 on C2 on CT of the neck.  This had resolved on repeat CT that was performed due to fall in the ED.  Neurosurgery notes that with this positional rotary subluxation patient does not need further work-up.  History of pancreatic cancer > Status post pancreatectomy - Continue to treat diabetes as above - Continue home pancrelipase  RLS - Holding home Requip for now  Diverticulosis > History of diverticulitis and abscess.  Status post colostomy. - WOC consult  DVT prophylaxis: Lovenox Code Status:   Full Family Communication:  Daughter updated by phone. Disposition Plan:   Patient is from:  Home  Anticipated DC to:  Home  Anticipated DC date:  2 to 7 days  Anticipated DC barriers: None  Consults called:  Neurology, consulted by ED and are following.  Neurosurgery consulted and have signed off. Admission status:  Inpatient, progressive  Severity of Illness: The appropriate patient status for this patient is INPATIENT. Inpatient status is judged to be reasonable and necessary in order to provide the required intensity of service to ensure the patient's safety. The patient's presenting symptoms, physical exam findings, and initial  radiographic and laboratory data in the context of their chronic comorbidities is felt to place them at high risk for further clinical deterioration. Furthermore, it is not anticipated that the patient will be medically stable for discharge from the hospital within 2 midnights of admission.   * I certify that at the point of admission it is my clinical judgment that the patient will require inpatient hospital care spanning beyond 2 midnights from the point of admission due to high intensity of service, high risk for further deterioration and high frequency  of surveillance required.Marcelyn Bruins MD [facility]  How to contact the [facility] Attending or Consulting provider [place] or covering provider during after hours [place], for this patient?   Check the care team in [facility] and look for a) attending/consulting TRH provider listed and b) the [facility] team listed Log into [email or web address] and use [place]'s universal password to access. If you do not have the password, please contact the hospital operator. Locate the [facility] provider you are looking for under [facility] and page to a number that you can be directly reached. If you still have difficulty reaching the provider, please page the [facility] (Director on Call) for the Hospitalists listed on amion for assistance.  04/06/2021, 1:01 AM

## 2021-04-06 NOTE — Assessment & Plan Note (Addendum)
Benazepril discontinued due to hyperkalemia. --BP stable off of antihypertensives

## 2021-04-06 NOTE — Progress Notes (Signed)
LTM maintenance performed. No skin breakdown was noted. Patient was still very active she was yelling and moving a lot.

## 2021-04-06 NOTE — Progress Notes (Signed)
vLTM set up [facility] to monitor  All impedances below 10kohms   Patient event pressed and tested
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
## 2021-04-06 NOTE — Assessment & Plan Note (Addendum)
Incidental finding with+ PCR on 04/05/2021.  Completed 10 days of airborne/contact isolation, now discontinued on 3/5

## 2021-04-06 NOTE — Consult Note (Signed)
[place] Nurse ostomy consult note Stoma type/location: LUQ colostomy (03/10/21 Dr. Dahlia Byes at [facility]) Stomal assessment/size: Not measured today. Measured 1 and 3/4 inches at last assessment 4 weeks ago) and was round, red and raised. Peristomal assessment: Not seen today Treatment options for stomal/peristomal skin: Patient uses a skin barrier ring placed around the stoma prior to pouching Output:Not observed Ostomy pouching: 2pc, 2 and 1/4 inch ostomy pouching system with skin barrier ring:  Pouch is Kellie Simmering # [phone], Skin barrier is Kellie Simmering # [phone] and skin barrier ring is Kellie Simmering # [phone] Education provided: None today Enrolled patient in [place] program: No, previously
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
## 2021-04-06 NOTE — Progress Notes (Signed)
Preliminary review of the EEG thus far negative for status epilepticus. Neurology will continue to follow with you   -- Amie Portland, MD Neurologist [facility] Pager: [phone]

## 2021-04-07 ENCOUNTER — Inpatient Hospital Stay (HOSPITAL_COMMUNITY): Payer: Medicare Other

## 2021-04-07 DIAGNOSIS — G40909 Epilepsy, unspecified, not intractable, without status epilepticus: Secondary | ICD-10-CM

## 2021-04-07 DIAGNOSIS — R569 Unspecified convulsions: Secondary | ICD-10-CM

## 2021-04-07 LAB — MAGNESIUM
Magnesium: 1.3 mg/dL — ABNORMAL LOW (ref 1.7–2.4)
Magnesium: 1.3 mg/dL — ABNORMAL LOW (ref 1.7–2.4)

## 2021-04-07 LAB — GLUCOSE, CAPILLARY
Glucose-Capillary: 140 mg/dL — ABNORMAL HIGH (ref 70–99)
Glucose-Capillary: 272 mg/dL — ABNORMAL HIGH (ref 70–99)
Glucose-Capillary: 241 mg/dL — ABNORMAL HIGH (ref 70–99)
Glucose-Capillary: 161 mg/dL — ABNORMAL HIGH (ref 70–99)
Glucose-Capillary: 143 mg/dL — ABNORMAL HIGH (ref 70–99)
Glucose-Capillary: 135 mg/dL — ABNORMAL HIGH (ref 70–99)

## 2021-04-07 LAB — VITAMIN B12: Vitamin B-12: 1322 pg/mL — ABNORMAL HIGH (ref 180–914)

## 2021-04-07 LAB — AMMONIA: Ammonia: 39 umol/L — ABNORMAL HIGH (ref 9–35)

## 2021-04-07 LAB — HEMOGLOBIN A1C
Hgb A1c MFr Bld: 15 % — ABNORMAL HIGH (ref 4.8–5.6)
Mean Plasma Glucose: 384 mg/dL

## 2021-04-07 LAB — TSH: TSH: 2.778 u[IU]/mL (ref 0.350–4.500)

## 2021-04-07 LAB — PHOSPHORUS
Phosphorus: 2.2 mg/dL — ABNORMAL LOW (ref 2.5–4.6)
Phosphorus: 2.5 mg/dL (ref 2.5–4.6)

## 2021-04-07 LAB — HIV ANTIBODY (ROUTINE TESTING W REFLEX): HIV Screen 4th Generation wRfx: NONREACTIVE

## 2021-04-07 LAB — FOLATE: Folate: 13.5 ng/mL (ref >5.9)

## 2021-04-07 MED ORDER — OSMOLITE 1.2 CAL PO LIQD
1000.0000 mL | ORAL | Status: DC
  Administered 2021-04-07 – 2021-04-09 (×3): 1000 mL
  Filled 2021-04-07 (×2): qty 1000

## 2021-04-07 MED ORDER — ATORVASTATIN CALCIUM 40 MG PO TABS
40.0000 mg | ORAL_TABLET | Freq: Every day | ORAL | Status: DC
  Administered 2021-04-07 – 2021-04-11 (×5): 40 mg
  Filled 2021-04-07 (×5): qty 1

## 2021-04-07 MED ORDER — BENAZEPRIL HCL 20 MG PO TABS
10.0000 mg | ORAL_TABLET | Freq: Every day | ORAL | Status: DC
  Administered 2021-04-08 – 2021-04-11 (×4): 10 mg
  Filled 2021-04-07 (×6): qty 1

## 2021-04-07 NOTE — Procedures (Signed)
Cortrak  Person Inserting Tube:  Wing Gfeller, Creola Corn, RD Tube Type:  Cortrak - 43 inches Tube Size:  10 Tube Location:  Left nare Initial Placement:  Stomach Secured by: Bridle Technique Used to Measure Tube Placement:  Marking at nare/corner of mouth Cortrak Secured At:  66 cm  Cortrak Tube Team Note:  Consult received to place a Cortrak feeding tube.   X-ray is required, abdominal x-ray has been ordered by the Cortrak team. Please confirm tube placement before using the Cortrak tube.   If the tube becomes dislodged please keep the tube and contact the Cortrak team at [email or web address] (password [email or web address]) for replacement.  If after hours and replacement cannot be delayed, place a NG tube and confirm placement with an abdominal x-ray.     Theone Stanley., MS, RD, LDN (she/her/hers) RD pager number and weekend/on-call pager number located in [place].

## 2021-04-07 NOTE — Assessment & Plan Note (Addendum)
Nutrition Status: Nutrition Problem: Severe Malnutrition Etiology: chronic illness (cancer, lupus) Signs/Symptoms: severe muscle depletion, severe fat depletion, percent weight loss Percent weight loss: 16.5 % (x5 months) Seen by nutrition.  Regular diet with thin liquids.  Continue to encourage increased supplementation.

## 2021-04-07 NOTE — Progress Notes (Signed)
°  Transition of Care [facility]) Screening Note   Patient Details  Name: Brittany [name] Date of Birth: 05-07-1960   Transition of Care [facility]) CM/SW Contact:    Pollie Friar, RN Phone Number: 04/07/2021, 8:31 AM    Transition of Care Department [facility]) has reviewed patient. We will continue to monitor patient advancement through interdisciplinary progression rounds. If new patient transition needs arise, please place a TOC consult. APS through [facility] is involved.

## 2021-04-07 NOTE — Progress Notes (Addendum)
Brief Nutrition Note  Consult received for enteral/tube feeding initiation and management. Discussed pt with MD. Adult Enteral Nutrition orders initiated. Full assessment can be found from RD note on 04/06/21.   Admitting Dx: Acute metabolic encephalopathy [N23.55]  Body mass index is 17.27 kg/m. Pt meets criteria for underweight based on current BMI.  Labs:  Recent Labs  Lab 04/05/21 0824 04/05/21 1032 04/06/21 0809 04/06/21 1221 04/06/21 1556  NA 136   < > 137 137 137  K 3.4*   < > 3.5 3.0* 2.9*  CL 96*   < > 99 99 99  CO2 18*   < > 27 28 26   BUN 12   < > 5* <5* <5*  CREATININE 0.94   < > 0.53 0.43* 0.46  CALCIUM 8.5*   < > 7.9* 7.9* 7.8*  MG 1.6*  --   --   --   --   GLUCOSE 460*   < > 139* 174* 98   < > = values in this interval not displayed.     Theone Stanley., MS, RD, LDN (she/her/hers) RD pager number and weekend/on-call pager number located in [place].

## 2021-04-07 NOTE — Evaluation (Signed)
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  EEG noted subclinical seizure.  Patient started on Keppra and transferred to [facility] from [facility] regional for continuous 24-hour EEG.  Since arrival, patient has been grossly confused, trying to rip out lines, Foley catheter.  MRI 2/22 with no acute findings. Pt has past medical history of hypertension, diabetes mellitus, CVA with right-sided weakness, tobacco abuse, COPD and status post pancreatectomy and splenectomy.    Assessment / Plan / Recommendation  Clinical Impression  Pt presents with a moderate oral dysphagia in part 2/2 cogntive impairments and clinical indicators of pharyngeal dysphagia.  Pt unable to complete OME, but did attempt to follow a few directions (opening mouth).  Pt with poor awaress of/attention to bolus trials.  Pt with minimal oral response and manipulation of ice chip which was removed via digital sweep.  With water by spoon, there was  pharyngeal swallow response presumed to follow passive oral transit of liquid.  Pt required 2 swallows per bolus.  With puree there was no oral manipulation of bolus and pt maintained open mouthed poster.  SLP removed puree with digital sweep followed by liquid wash by spoon/  Pt is not appropriate for PO intake at this time.  SLP to follow for PO readiness and/or need for instrumental swallow study.    Recommend pt remain NPO with short term alternate means of nutrition, hydration and medication at this time.  SLP Visit Diagnosis: Dysphagia, oropharyngeal phase (R13.12)    Aspiration Risk  Moderate aspiration risk    Diet Recommendation NPO;Alternative means - temporary   Medication Administration: Via alternative means    Other  Recommendations Oral Care Recommendations: Oral care QID Other Recommendations: Have oral suction available    Recommendations for follow up therapy are one component of a multi-disciplinary discharge planning process, led by the attending physician.  Recommendations may be updated based on patient status, additional functional criteria and insurance authorization.  Follow up Recommendations Skilled nursing-short term rehab (<3 hours/day) (recommendations may change with improvement in clinical presentation)      Assistance Recommended at Discharge Frequent or constant Supervision/Assistance  Functional Status Assessment Patient has had a recent decline in their functional status and demonstrates the ability to make significant improvements in function in a reasonable and predictable amount of time.  Frequency and Duration min 2x/week  2 weeks       Prognosis Prognosis for Safe Diet Advancement: Good      Swallow Study  General HPI: 61 year old female with normal baseline of oriented x3 brought in by EMS for confusion plus DKA.  EEG noted subclinical seizure.  Patient started on Keppra and transferred to [facility] from [facility] regional for continuous  24-hour EEG.  Since arrival, patient has been grossly confused, trying to rip out lines, Foley catheter.  MRI 2/22 with no acute findings. Pt has past medical history of hypertension, diabetes mellitus, CVA with right-sided weakness, tobacco abuse, COPD and status post pancreatectomy and splenectomy. Type of Study: Bedside Swallow Evaluation Diet Prior to this Study: NPO Temperature Spikes Noted: No Respiratory Status: Room air History of Recent Intubation: No Behavior/Cognition: Alert;Requires cueing;Doesn't follow directions Oral Cavity Assessment:  (Slight increase in secretions) Oral Care Completed by SLP: No Oral Cavity - Dentition: Missing dentition;Poor condition Self-Feeding Abilities: Total assist Patient Positioning: Upright in bed Baseline Vocal Quality: Not observed Volitional Cough: Cognitively unable to elicit Volitional Swallow: Unable to elicit    Oral/Motor/Sensory Function Overall Oral Motor/Sensory Function:  (Unable to assess) Facial Symmetry: Within Functional Limits   Ice Chips Ice chips: Impaired Oral Phase Impairments: Poor awareness of bolus;Impaired mastication Other Comments: removed with digital sweep   Thin Liquid Thin Liquid: Impaired Oral Phase Impairments: Poor awareness of bolus Pharyngeal  Phase Impairments: Multiple swallows    Nectar Thick Nectar Thick Liquid: Not tested   Honey Thick Honey Thick Liquid: Not tested   Puree Puree: Impaired Oral Phase Impairments: Poor awareness of bolus;Impaired mastication;Reduced lingual movement/coordination   Solid     Solid: Not tested      Celedonio Savage, [place], [place] Office: [phone] 04/07/2021,11:25 AM

## 2021-04-07 NOTE — Progress Notes (Signed)
[facility] Progress Note  [name]    WHQ:[id number]  DOA: 04/05/2021    Date of Service: the patient was seen and examined on 04/07/2021  Brief hospital course: 61 year old female with past medical history of hypertension, diabetes mellitus, CVA with right-sided weakness, tobacco abuse, COPD and status post pancreatectomy and splenectomy with normal baseline of oriented x3 brought in by EMS for confusion plus DKA.  EEG noted subclinical seizure.  Also found to have incidental COVID.  Patient started on Keppra and transferred to [facility] from [facility] regional for continuous 24-hour EEG.  Following arrival, patient remained quite confused requiring restraints and medication.  DKA stabilized.  EEG noted previous episodes of seizure activity, now stabilized.  Assessment and Plan: Assessment and Plan: * New onset seizure ([place]) Suspect underlying etiology is previous CVA due to lowered seizure threshold made worse by DKA/COVID.  No seizures currently, on Keppra.  Appreciate neurology help.  Acute metabolic encephalopathy- (present on admission) It is actually unclear what her full baseline is.  She was living with a daughter, but other family reports some confusion prior to that.  For now, n.p.o. with core track tube and tube feedings.  COVID-19 virus infection- (present on admission) Incidental finding.  We will follow CRP levels.  DKA (diabetic ketoacidosis) ([place])- (present on admission) Resolved.  Treated with IV insulin and fluids.  Now on Lantus and sliding scale  Essential hypertension- (present on admission) Neurology following.  Status epilepticus ruled out.  Protein-calorie malnutrition, severe- (present on admission) Nutrition Status: Nutrition Problem: Severe Malnutrition Etiology: chronic illness (cancer, lupus) Signs/Symptoms: severe muscle depletion, severe fat depletion, percent weight loss Percent weight loss: 16.5 % (x5 months) Seen by nutrition.  For  now, n.p.o. so placed on tube feedings at very slow rate to hopefully minimize refeeding syndrome    Recurrent major depressive disorder ([place])- (present on admission) Possible her encephalopathy could be some acute psychosis.  Once we have ruled out medical issues, may need to consult psychiatry       Body mass index is 17.27 kg/m.  Nutrition Problem: Severe Malnutrition Etiology: chronic illness (cancer, lupus)     Consultants: Neurology  Procedures: EEG  Antimicrobials: None  Code Status: Full code   Subjective: Confused, delirious, although not as agitated as previous day  Objective: Markedly elevated blood pressures Vitals:   04/07/21 0813 04/07/21 1300  BP: (!) 160/111 (!) 149/102  Pulse: 90 79  Resp: 16   Temp: 98 F (36.7 C) 98 F (36.7 C)  SpO2: 97% 100%    Intake/Output Summary (Last 24 hours) at 04/07/2021 1635 Last data filed at 04/07/2021 0315 Gross per 24 hour  Intake 1845.98 ml  Output --  Net 1845.98 ml    Filed Weights   04/05/21 2245 04/06/21 0414 04/07/21 0500  Weight: 40.9 kg 40.9 kg 40.1 kg   Body mass index is 17.27 kg/m.  Exam:  General: Awake, not oriented at all, responds to voice but does not follow commands HEENT: Normocephalic and atraumatic, mucous membranes are dry Cardiovascular: Regular rate and rhythm, S1-S2 Respiratory: Clear to auscultation bilaterally Abdomen: Soft,?  Nontender, nondistended, hypoactive bowel sounds Musculoskeletal: No clubbing or cyanosis or edema Psychiatry: Patient remains acutely delirious Neurology: No focal deficits although difficult to assess  Data Reviewed: Noted hypokalemia  Disposition:  Status is: Inpatient Remains inpatient appropriate because: Further evaluation.  Plus new determination for home situation.  DSS is investigating    Family Communication: Discussed with patient's sister.  They  are working on medical power of attorney.  They agree that patient is not safe to  return to the care of her daughter and likely will need a long-term care facility. DVT Prophylaxis:    SCDs   Author: Annita Brod ,MD 04/07/2021 4:35 PM  To reach On-call, see care teams to locate the attending and reach out via [email or web address]. Between 7PM-7AM, please contact night-coverage If you still have difficulty reaching the attending provider, please page the [facility] (Director on Call) for [facility] on amion for assistance.

## 2021-04-07 NOTE — Assessment & Plan Note (Addendum)
Suspect due to lowered seizure threshold, precipitated by DKA/COVID. Neurology was consulted and followed during initial hospital course.  Discussed with psychiatry, given behavioral disturbances may be related to Keppra use, Keppra dose reduced in favor of Depakote. --Depakote level remains low, 23 today --Increase Depakote to 500 mg BID --Continue Keppra 250 mg BID. --Outpatient follow-up with neurology in 2 to 3 months.

## 2021-04-07 NOTE — Progress Notes (Signed)
Subjective: No clinical seizures overnight.  Patient appears more lethargic this morning.  He did receive 2 doses of IV Ativan 2 mg and a dose of IV with Ativan 1 mg over the last 24 hours.  Patient's sister and aunt at bedside.  ROS: Unable to obtain due to poor mental status  Examination  Vital signs in last 24 hours: Temp:  [97.6 F (36.4 C)-98.1 F (36.7 C)] 98 F (36.7 C) (02/24 0813) Pulse Rate:  [73-90] 90 (02/24 0813) Resp:  [16] 16 (02/24 0813) BP: (138-160)/(83-111) 160/111 (02/24 0813) SpO2:  [96 %-100 %] 97 % (02/24 0813) Weight:  [40.1 kg] 40.1 kg (02/24 0500)  General: lying in bed, NAD CVS: pulse-normal rate and rhythm RS: breathing comfortably Extremities: normal  Neuro: Lethargic, opens eyes but doesn't follow commands, PERLA, no forced gaze deviation, moving all extremities in bed but right less than left 9 has baseline right hemiparesis)    Basic Metabolic Panel: Recent Labs  Lab 04/05/21 0824 04/05/21 1032 04/06/21 0112 04/06/21 0429 04/06/21 0809 04/06/21 1221 04/06/21 1556  NA 136   < > 136 139 137 137 137  K 3.4*   < > 3.2* 3.1* 3.5 3.0* 2.9*  CL 96*   < > 99 101 99 99 99  CO2 18*   < > 19* 25 27 28 26   GLUCOSE 460*   < > 282* 164* 139* 174* 98  BUN 12   < > 7* <5* 5* <5* <5*  CREATININE 0.94   < > 0.74 0.61 0.53 0.43* 0.46  CALCIUM 8.5*   < > 7.6* 7.9* 7.9* 7.9* 7.8*  MG 1.6*  --   --   --   --   --   --    < > = values in this interval not displayed.    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       New onset epilepsy COVID Acute encephalopathy, infectious and postictal -Patient is confused likely due to  COVID, postictal state, Ativan use overnight  Recommendations -Continue keppra 500mg   BID -DC LTM EEG as no seizures  overnight - if any sz, can increase keppra. If frequent seizures, can load with fosphenytoin -Patient appears to be malnourished.  Will order B12, folate, TSH, ammonia, thiamine, RPR to look for reversible causes of encephalopathy -Avoid sedating medications - seizure precautions - PRN Iv ativan for clinical sz - management of rest of comorbidity per peri team -Plan discussed with Dr. [place] Pink, family at bedside  Zeb Comfort Epilepsy [facility] For questions after 5pm please refer to AMION to reach the Neurologist on call

## 2021-04-07 NOTE — Procedures (Addendum)
Patient Name: Brittany [name]  MRN: [id number]  Epilepsy Attending: Lora Havens  Referring Physician/Provider: Amie Portland, MD Duration: 04/06/2021 2336 to 04/07/2021 1116   Patient history: 61 year old female with new onset confusion and was found to have seizures on routine EEG.  LTM EEG to look for intermittent seizures.   Level of alertness: Awake, asleep   AEDs during EEG study: LEV   Technical aspects: This EEG study was done with scalp electrodes positioned according to the 10-20 International system of electrode placement. Electrical activity was acquired at a sampling rate of 500Hz  and reviewed with a high frequency filter of 70Hz  and a low frequency filter of 1Hz . EEG data were recorded continuously and digitally stored.    Description: The posterior dominant rhythm consists of 9-10 Hz activity of moderate voltage (25-35 uV) seen predominantly in posterior head regions, symmetric and reactive to eye opening and eye closing. Sleep was characterized by vertex waves, sleep spindles (12 to 14 Hz), maximal frontocentral region.  EEG also showed intermittent 3 to 5 Hz theta -delta slowing as well as lateralized periodic discharges were noted in left hemisphere, maximal left temporal region with frequent 0.5 to 1 Hz which appears rhythmic at times. Hyperventilation and photic stimulation were not performed.      ABNORMALITY - Lateralized periodic discharges, left hemisphere, maximal left temporal region - Intermittent slow, left hemisphere, maximal left temporal region   IMPRESSION:  This study showed evidence of epileptogenicity as well as cortical dysfunction arising from left hemisphere, maximal left temporal region likely due to underlying structural abnormality. Of note, this EEG pattern is on the ictal-interictal continuum with low potential for seizure recurrence. No definite seizures were seen throughout the recording.   [name] [name]

## 2021-04-07 NOTE — Progress Notes (Signed)
Discontinued cEEG study.  Notified [facility] monitoring.  No skin breakdown observed. 

## 2021-04-07 NOTE — Plan of Care (Signed)
°  Problem: Clinical Measurements: Goal: Diagnostic test results will improve Outcome: Progressing Goal: Respiratory complications will improve Outcome: Progressing Goal: Cardiovascular complication will be avoided Outcome: Progressing   Problem: Elimination: Goal: Will not experience complications related to bowel motility Outcome: Progressing Goal: Will not experience complications related to urinary retention Outcome: Progressing   Problem: Safety: Goal: Ability to remain free from injury will improve Outcome: Progressing   Problem: Skin Integrity: Goal: Risk for impaired skin integrity will decrease Outcome: Progressing

## 2021-04-07 NOTE — Progress Notes (Signed)
This chaplain responded to RN-Brian page for [facility] education for the Pt. family. The chaplain reviewed the Pt. chart and was updated by RN-Ty.  The family is no longer at the Pt. bedside at the time of the chaplain visit.   The chaplain understands the family's goal is to assign an HCPOA who is  geographically closer to the Pt. The chaplain left the AD education with the RN and will ask spiritual care to F/U on Monday.   Chaplain Sallyanne Kuster [phone]

## 2021-04-08 LAB — BASIC METABOLIC PANEL
Anion gap: 10 (ref 5–15)
BUN: <5 mg/dL — ABNORMAL LOW (ref 8–23)
CO2: 27 mmol/L (ref 22–32)
Calcium: 7.9 mg/dL — ABNORMAL LOW (ref 8.9–10.3)
Chloride: 98 mmol/L (ref 98–111)
Creatinine, Ser: 0.56 mg/dL (ref 0.44–1.00)
GFR, Estimated: >60 mL/min (ref >60)
Glucose, Bld: 175 mg/dL — ABNORMAL HIGH (ref 70–99)
Potassium: 3.7 mmol/L (ref 3.5–5.1)
Sodium: 135 mmol/L (ref 135–145)

## 2021-04-08 LAB — GLUCOSE, CAPILLARY
Glucose-Capillary: 260 mg/dL — ABNORMAL HIGH (ref 70–99)
Glucose-Capillary: 323 mg/dL — ABNORMAL HIGH (ref 70–99)
Glucose-Capillary: 177 mg/dL — ABNORMAL HIGH (ref 70–99)
Glucose-Capillary: 176 mg/dL — ABNORMAL HIGH (ref 70–99)
Glucose-Capillary: 273 mg/dL — ABNORMAL HIGH (ref 70–99)

## 2021-04-08 LAB — COMPREHENSIVE METABOLIC PANEL
ALT: 36 U/L (ref 0–44)
AST: 57 U/L — ABNORMAL HIGH (ref 15–41)
Albumin: 2 g/dL — ABNORMAL LOW (ref 3.5–5.0)
Alkaline Phosphatase: 112 U/L (ref 38–126)
Anion gap: 9 (ref 5–15)
BUN: <5 mg/dL — ABNORMAL LOW (ref 8–23)
CO2: 30 mmol/L (ref 22–32)
Calcium: 7.5 mg/dL — ABNORMAL LOW (ref 8.9–10.3)
Chloride: 96 mmol/L — ABNORMAL LOW (ref 98–111)
Creatinine, Ser: 0.52 mg/dL (ref 0.44–1.00)
GFR, Estimated: >60 mL/min (ref >60)
Glucose, Bld: 302 mg/dL — ABNORMAL HIGH (ref 70–99)
Potassium: 2.1 mmol/L — CL (ref 3.5–5.1)
Sodium: 135 mmol/L (ref 135–145)
Total Bilirubin: 0.3 mg/dL (ref 0.3–1.2)
Total Protein: 5.7 g/dL — ABNORMAL LOW (ref 6.5–8.1)

## 2021-04-08 LAB — CBC
HCT: 34.8 % — ABNORMAL LOW (ref 36.0–46.0)
Hemoglobin: 11.6 g/dL — ABNORMAL LOW (ref 12.0–15.0)
MCH: 29.7 pg (ref 26.0–34.0)
MCHC: 33.3 g/dL (ref 30.0–36.0)
MCV: 89.2 fL (ref 80.0–100.0)
Platelets: 415 10*3/uL — ABNORMAL HIGH (ref 150–400)
RBC: 3.9 MIL/uL (ref 3.87–5.11)
RDW: 16.3 % — ABNORMAL HIGH (ref 11.5–15.5)
WBC: 6.1 10*3/uL (ref 4.0–10.5)
nRBC: 0.3 % — ABNORMAL HIGH (ref 0.0–0.2)

## 2021-04-08 LAB — PHOSPHORUS
Phosphorus: 3.5 mg/dL (ref 2.5–4.6)
Phosphorus: 3.4 mg/dL (ref 2.5–4.6)

## 2021-04-08 LAB — C-REACTIVE PROTEIN: CRP: 0.6 mg/dL (ref <1.0)

## 2021-04-08 LAB — RPR: RPR Ser Ql: NONREACTIVE

## 2021-04-08 LAB — MAGNESIUM
Magnesium: 1.3 mg/dL — ABNORMAL LOW (ref 1.7–2.4)
Magnesium: 2.2 mg/dL (ref 1.7–2.4)

## 2021-04-08 MED ORDER — POTASSIUM CHLORIDE 20 MEQ PO PACK: 40.0000 meq | PACK | ORAL | Status: DC

## 2021-04-08 MED ORDER — THIAMINE HCL 100 MG PO TABS
100.0000 mg | ORAL_TABLET | Freq: Every day | ORAL | Status: DC
  Administered 2021-04-08 – 2021-04-11 (×4): 100 mg
  Filled 2021-04-08 (×5): qty 1

## 2021-04-08 MED ORDER — MAGNESIUM SULFATE 50 % IJ SOLN
3.0000 g | Freq: Once | INTRAVENOUS | Status: AC
  Administered 2021-04-08: 3 g via INTRAVENOUS
  Filled 2021-04-08: qty 6

## 2021-04-08 MED ORDER — POLYETHYLENE GLYCOL 3350 17 G PO PACK: 17.0000 g | PACK | Freq: Every day | ORAL | Status: DC | PRN

## 2021-04-08 MED ORDER — POTASSIUM CHLORIDE 20 MEQ PO PACK
40.0000 meq | PACK | ORAL | Status: AC
  Administered 2021-04-08 (×3): 40 meq
  Filled 2021-04-08 (×3): qty 2

## 2021-04-08 NOTE — Progress Notes (Addendum)
[facility] Progress Note  [name]    GEX:[id number]  DOA: 04/05/2021    Date of Service: the patient was seen and examined on 04/08/2021  Brief hospital course: 61 year old female with past medical history of hypertension, diabetes mellitus, CVA with right-sided weakness, tobacco abuse, COPD and status post pancreatectomy and splenectomy with normal baseline of oriented x3 brought in by EMS for confusion plus DKA.  EEG noted subclinical seizure.  Also found to have incidental COVID.  Patient started on Keppra and transferred to [facility] from [facility] regional for continuous 24-hour EEG.  Following arrival, patient remained quite confused requiring restraints and medication.  DKA stabilized.  EEG noted previous episodes of seizure activity, now stabilized.  Since then, patient's mentation slowly improving although she is still quite confused.  Tolerated placement of core track tube for nutrition.  Assessment and Plan: Assessment and Plan: * New onset seizure ([place]) Suspect underlying etiology is previous CVA due to lowered seizure threshold made worse by DKA/COVID.  No seizures currently, on Keppra.  Appreciate neurology help.  Acute metabolic encephalopathy- (present on admission) It is actually unclear what her full baseline is.  She was living with a daughter, but other family reports some confusion prior to that.  For now, n.p.o. with core track tube and tube feedings.  COVID-19 virus infection- (present on admission) Incidental finding.  We will follow CRP levels.  DKA (diabetic ketoacidosis) ([place])- (present on admission) Resolved.  Treated with IV insulin and fluids.  Now on Lantus and sliding scale  Essential hypertension- (present on admission) Neurology following.  Status epilepticus ruled out.  Protein-calorie malnutrition, severe- (present on admission) Nutrition Status: Nutrition Problem: Severe Malnutrition Etiology: chronic illness (cancer,  lupus) Signs/Symptoms: severe muscle depletion, severe fat depletion, percent weight loss Percent weight loss: 16.5 % (x5 months) Seen by nutrition.  For now, n.p.o. so placed on tube feedings at very slow rate to hopefully minimize refeeding syndrome  With mentation improving, will attempt to feed some liquids like Jell-O    Recurrent major depressive disorder ([place])- (present on admission) Possible her encephalopathy could be some acute psychosis.  Once we have ruled out medical issues, may need to consult psychiatry       Body mass index is 18.26 kg/m.  Nutrition Problem: Severe Malnutrition Etiology: chronic illness (cancer, lupus)     Consultants: Neurology  Procedures: EEG  Antimicrobials: None  Code Status: Full code   Subjective: A little bit more alert and following some commands although not necessarily consistently.  Objective: Markedly elevated blood pressures Vitals:   04/08/21 2018 04/08/21 2042  BP: 129/86   Pulse: 84   Resp: 16   Temp: 98.6 F (37 C)   SpO2:  97%    Intake/Output Summary (Last 24 hours) at 04/08/2021 2043 Last data filed at 04/08/2021 1449 Gross per 24 hour  Intake 3663.99 ml  Output 550 ml  Net 3113.99 ml   Filed Weights   04/06/21 0414 04/07/21 0500 04/08/21 0413  Weight: 40.9 kg 40.1 kg 42.4 kg   Body mass index is 18.26 kg/m.  Exam:  General: Awake, oriented x1, more calm. HEENT: Normocephalic and atraumatic, mucous membranes are dry Cardiovascular: Regular rate and rhythm, S1-S2 Respiratory: Clear to auscultation bilaterally Abdomen: Soft,?  Nontender, nondistended, hypoactive bowel sounds Musculoskeletal: No clubbing or cyanosis or edema Psychiatry: Patient remains acutely delirious Neurology: No focal deficits although difficult to assess  Data Reviewed: Noted hypokalemia  Disposition:  Status is: Inpatient Remains inpatient appropriate  because: Further evaluation.  Plus new determination for home  situation.  DSS is investigating    Family Communication: Discussed with patient's sister.  They are working on medical power of attorney.  They agree that patient is not safe to return to the care of her daughter and likely will need a long-term care facility. DVT Prophylaxis:    SCDs   Author: Annita Brod ,MD 04/08/2021 8:43 PM  To reach On-call, see care teams to locate the attending and reach out via [email or web address]. Between 7PM-7AM, please contact night-coverage If you still have difficulty reaching the attending provider, please page the [facility] (Director on Call) for [facility] on amion for assistance.

## 2021-04-08 NOTE — Progress Notes (Signed)
Speech Language Pathology Treatment: Dysphagia  Patient Details Name: IMAGINE NEST MRN: [id number] DOB: 08-22-1960 Today's Date: 04/08/2021 Time: 9678-9381 SLP Time Calculation (min) (ACUTE ONLY): 20 min  Assessment / Plan / Recommendation Clinical Impression  Patient seen by SLP for skilled treatment session focused on dysphagia goals. Her sister was in the room during this session to observe. Patient was awake and alert, telling SLP "Im thirsty, I want some food". She did not initially demonstrate ability to form lips on cup, spoon or straw and required moderate level of cues to perform. She was able to consume both cup and straw sips of thin liquids (juice and water) and bites of puree (applesauce) with suspected swallow initiation delay but no coughing, throat clearing or other overt s/s dysphagia. SLP did not trial any solids beyond puree as patient contiues with significant cognitive impairment resulting in decreased safety with PO intake, especially with foods requiring mastication. SLP is recommending to start full liquids (thin consistency) at this time. SLP to continue to follow patient for dysphagia goals.     HPI HPI: 61 year old female with normal baseline of oriented x3 brought in by EMS for confusion plus DKA.  EEG noted subclinical seizure.  Patient started on Keppra and transferred to [facility] from [facility] regional for continuous 24-hour EEG.  Since arrival, patient has been grossly confused, trying to rip out lines, Foley catheter.  MRI 2/22 with no acute findings. Pt has past medical history of hypertension, diabetes mellitus, CVA with right-sided weakness, tobacco abuse, COPD and status post pancreatectomy and splenectomy.      SLP Plan  Continue with current plan of care      Recommendations for follow up therapy are one component of a multi-disciplinary discharge planning process, led by the attending physician.  Recommendations may be updated based on patient status,  additional functional criteria and insurance authorization.    Recommendations  Diet recommendations: Other(comment);Thin liquid (full liquids) Liquids provided via: Cup;Straw Medication Administration: Crushed with puree Supervision: Staff to assist with self feeding;Trained caregiver to feed patient;Full supervision/cueing for compensatory strategies Compensations: Minimize environmental distractions;Slow rate;Small sips/bites Postural Changes and/or Swallow Maneuvers: Seated upright 90 degrees                Oral Care Recommendations: Oral care QID;Staff/trained caregiver to provide oral care Follow Up Recommendations: Skilled nursing-short term rehab (<3 hours/day) Assistance recommended at discharge: Frequent or constant Supervision/Assistance SLP Visit Diagnosis: Dysphagia, oropharyngeal phase (R13.12) Plan: Continue with current plan of care          Sonia Baller, MA, CCC-SLP Speech Therapy

## 2021-04-08 NOTE — Progress Notes (Signed)
Critical Potassium 2.1 reported to MD at 0653 04/08/21. Awaiting response/orders at this time.

## 2021-04-08 NOTE — Progress Notes (Signed)
Daughter [name] called, left number with Secretary [phone], attempted to call back phone wasn't working. Attempted to call number listed in chart, no answer and voicemail mailbox was full.

## 2021-04-08 NOTE — Progress Notes (Signed)
HOSPITAL MEDICINE OVERNIGHT EVENT NOTE    Notified by nursing that patient continues to exhibit confusion.  Patient is continuing to pull at medical devices, is regularly attempting to get out of bed and is impeding plan of care and regularly putting herself at risk.  Per my discussion with nursing unfortunately there is no sitter available.  We will renew restraint order for two-point soft wrist restraints.  Continue to monitor closely.  Vernelle Emerald  MD [facility]

## 2021-04-08 NOTE — Progress Notes (Signed)
HOSPITAL MEDICINE OVERNIGHT EVENT NOTE    Patient exhibiting severe hypokalemia and hypomagnesemia in the setting of tube feeds and severe protein calorie malnutrition.  Concern for possible refeeding syndrome.  Treating with 3 g of intravenous magnesium sulfate as well as 40 mill equivalents of potassium chloride elixir via the feeding tube every 4 hours x3 doses.  We will obtain repeat chemistry this afternoon after replacement is complete which can be followed up on and acted upon promptly.  Vernelle Emerald  MD [facility]

## 2021-04-08 NOTE — Progress Notes (Signed)
Patient abdominal surgical incision irritated, appearance different from previous assessments. MD notified.

## 2021-04-08 NOTE — Progress Notes (Signed)
Patient ate 100% of soup and 100% of jello asking for more, order placed

## 2021-04-08 NOTE — Progress Notes (Addendum)
Patient's sister called to get an update on patient. Nurse updated sister appropriately and answered all questions and concerns. Sister very concerned about patient's well-being in her daughter's care. Sister states that her niece, the patient's daughter, does not feed her, make sure she takes her medicine, and does not clean or care for her as she should. Sister states her health has declined due to the lack of care and the house is filthy and not meant to be lived in because her niece does not clean or care for it. Sister states that niece wants patient home because she has no other place to go and needs the patient home to ensure she and her 2 young children have a place to live. Sister inquired about HOA paperwork and how to get patient to sign forms when confused. Instructed patient's sister to come to the hospital and speak with the doctor and spiritual to gain insight about legality of paperwork and who is responsible. Sister seems very concerned about patient's health and well-being.  Sister mentioned drugs and drinking, unsure if this is occurring in the home of the patient or by the niece as information provided needed clarity. APS was consulted by ED staff upon arrival to hospital, unsure of progress of the case.  Will pass on to day nurse.

## 2021-04-08 NOTE — Progress Notes (Signed)
Contacted pharmacy about patient medication Creon not being able to be administered per tube, contacted MD no new orders at this time

## 2021-04-08 NOTE — Progress Notes (Signed)
Neurology Progress Note  Brief HPI: 61 y.o. female with PMHx of DM2, HTN, lupus, history of PCA stroke who presented initially to [facility] on 2/22 for confusion. EMS witnessed an event concerning for a possible partial seizure en route. An EEG was obtained, revealing LPD's with a brief ictal discharge and she was transferred to [facility] for continuous EEG. Labs revealed patient to be hypomagnesemic s/p replacement and was found to be COVID positive.   Subjective: Overnight labs concerning for severe hypokalemia and hypomagnesemia in the setting of severe protein calorie malnutrition. Patient's electrolytes are being replaced and monitored by primary team with some concern for possible refeeding syndrome.  Patient with continued confusion overnight, attempting to ambulate, interfering with medical devices.   Exam: Vitals:   04/08/21 0314 04/08/21 0818  BP: 114/86 118/88  Pulse: 85 90  Resp: 16 16  Temp: 97.8 F (36.6 C) 99.2 F (37.3 C)  SpO2: 97% 95%   Gen: Laying comfortably in hospital bed with bilateral soft wrist restraints in place, in no acute distress Resp: non-labored breathing, no respiratory distress on room air  Abd: soft, non-distended  Neuro: Mental Status: Lethargic. Briefly opens eyes to voice and touch before drifting off to sleep again.  She does not follow commands. She does not vocalize during examination.  Cranial Nerves: Left pupil is irregular, 4 mm, and briskly reactive to light, right pupil is 3 mm, regular, round, and reactive to light, she tracks examiner around the room with brief eye opening, blinks to threat throughout, head is grossly midline. Patient does not protrude tongue to command or allow examiner to view soft palate.  Motor: Patient has minimal withdrawal of the right upper and lower extremity to noxious stimuli (right hemiparesis at baseline) and brisk withdrawal of left upper and lower extremities with application of noxious stimuli.  Sensory: As  above Gait: Deferred for patient safety   Pertinent Labs: CBC    Component Value Date/Time   WBC 6.1 04/08/2021 0553   RBC 3.90 04/08/2021 0553   HGB 11.6 (L) 04/08/2021 0553   HGB 13.9 08/17/2011 1815   HCT 34.8 (L) 04/08/2021 0553   HCT 42.1 08/17/2011 1815   PLT 415 (H) 04/08/2021 0553   PLT 392 08/17/2011 1815   MCV 89.2 04/08/2021 0553   MCV 100 08/17/2011 1815   MCH 29.7 04/08/2021 0553   MCHC 33.3 04/08/2021 0553   RDW 16.3 (H) 04/08/2021 0553   RDW 14.7 (H) 08/17/2011 1815   LYMPHSABS 3.4 12/25/2020 0407   LYMPHSABS 2.7 08/17/2011 1815   MONOABS 0.6 12/25/2020 0407   MONOABS 0.4 08/17/2011 1815   EOSABS 0.0 12/25/2020 0407   EOSABS 0.0 08/17/2011 1815   BASOSABS 0.1 12/25/2020 0407   BASOSABS 0.0 08/17/2011 1815   CMP     Component Value Date/Time   NA 135 04/08/2021 0553   NA 139 08/17/2011 1815   K 2.1 (LL) 04/08/2021 0553   K 3.6 08/17/2011 1815   CL 96 (L) 04/08/2021 0553   CL 105 08/17/2011 1815   CO2 30 04/08/2021 0553   CO2 27 08/17/2011 1815   GLUCOSE 302 (H) 04/08/2021 0553   GLUCOSE 61 (L) 08/17/2011 1815   BUN <5 (L) 04/08/2021 0553   BUN 18 08/17/2011 1815   CREATININE 0.52 04/08/2021 0553   CREATININE 1.17 08/17/2011 1815   CALCIUM 7.5 (L) 04/08/2021 0553   CALCIUM 9.7 08/17/2011 1815   PROT 5.7 (L) 04/08/2021 0553   PROT 9.8 (H) 04/04/2011 1527  ALBUMIN 2.0 (L) 04/08/2021 0553   ALBUMIN 4.5 04/04/2011 1527   AST 57 (H) 04/08/2021 0553   AST 35 04/04/2011 1527   ALT 36 04/08/2021 0553   ALT 33 04/04/2011 1527   ALKPHOS 112 04/08/2021 0553   ALKPHOS 97 04/04/2011 1527   BILITOT 0.3 04/08/2021 0553   BILITOT 0.6 04/04/2011 1527   GFRNONAA >60 04/08/2021 0553   GFRNONAA 54 (L) 08/17/2011 1815   GFRAA >60 11/02/2019 1752   GFRAA >60 08/17/2011 1815   Magnesium 2/24: 1.3  Ammonia 2/24: 39  Lab Results  Component Value Date   TSH 2.778 04/07/2021   Lab Results  Component Value Date   VITAMINB12 1,322 (H) 04/07/2021   Lab  Results  Component Value Date   FOLATE 13.5 04/07/2021   RPR and Thiamine levels pending  Imaging Reviewed: No new neurologic imaging.   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 Since admission, patient was found to have an acute COVID infection and electrolyte derangements including hypokalemia and hypomagnesemia with severe protein calorie malnutrition.   Impression: New onset epilepsy COVID Acute encephalopathy, infectious and postictal -Patient is confused likely due to acute COVID infection, postictal state  Recommendations: - Continue Keppra 500 mg BID - Per Dr. Hortense Ramal: If any seizure recurrence, can increase keppra. If frequent seizures, can load with fosphenytoin - Thiamine and RPR pending - Continue evaluation and management of electrolyte derangements per primary team as you are  - Avoid sedating medications - Continue seizure precautions - PRN Ativan IV for clinical seizures - Continue management of comorbid conditions per primary team   Anibal Henderson, AGACNP-BC [facility] [phone]  Electronically signed: Dr. Kerney Elbe

## 2021-04-09 DIAGNOSIS — E43 Unspecified severe protein-calorie malnutrition: Secondary | ICD-10-CM

## 2021-04-09 LAB — GLUCOSE, CAPILLARY
Glucose-Capillary: 123 mg/dL — ABNORMAL HIGH (ref 70–99)
Glucose-Capillary: 149 mg/dL — ABNORMAL HIGH (ref 70–99)
Glucose-Capillary: 189 mg/dL — ABNORMAL HIGH (ref 70–99)
Glucose-Capillary: 244 mg/dL — ABNORMAL HIGH (ref 70–99)
Glucose-Capillary: 276 mg/dL — ABNORMAL HIGH (ref 70–99)
Glucose-Capillary: 322 mg/dL — ABNORMAL HIGH (ref 70–99)
Glucose-Capillary: 351 mg/dL — ABNORMAL HIGH (ref 70–99)
Glucose-Capillary: 397 mg/dL — ABNORMAL HIGH (ref 70–99)

## 2021-04-09 LAB — BASIC METABOLIC PANEL
Anion gap: 10 (ref 5–15)
BUN: 5 mg/dL — ABNORMAL LOW (ref 8–23)
CO2: 25 mmol/L (ref 22–32)
Calcium: 8 mg/dL — ABNORMAL LOW (ref 8.9–10.3)
Chloride: 100 mmol/L (ref 98–111)
Creatinine, Ser: 0.45 mg/dL (ref 0.44–1.00)
GFR, Estimated: >60 mL/min (ref >60)
Glucose, Bld: 116 mg/dL — ABNORMAL HIGH (ref 70–99)
Potassium: 3.8 mmol/L (ref 3.5–5.1)
Sodium: 135 mmol/L (ref 135–145)

## 2021-04-09 MED ORDER — LEVETIRACETAM 100 MG/ML PO SOLN
500.0000 mg | Freq: Two times a day (BID) | ORAL | Status: DC
  Administered 2021-04-09 – 2021-04-11 (×5): 500 mg
  Filled 2021-04-09 (×6): qty 5

## 2021-04-09 MED ORDER — DIPHENHYDRAMINE HCL 25 MG PO CAPS
25.0000 mg | ORAL_CAPSULE | Freq: Once | ORAL | Status: AC
  Administered 2021-04-10: 25 mg via ORAL
  Filled 2021-04-09: qty 1

## 2021-04-09 NOTE — Progress Notes (Signed)
Patient ate 100% of grits and ice cream for breakfast, drank 100% grape juice and 90% coffee with cream and 2 sugars. Sister at bedside now with smoothie from [place]. Patient still hungry.

## 2021-04-09 NOTE — Progress Notes (Signed)
Patient states " was itchy and scratched in my sleep" pulled Ostomy bag partially off. Patient cleaned up. Charge RN informed. Stat order placed.

## 2021-04-09 NOTE — Progress Notes (Addendum)
HOSPITAL MEDICINE OVERNIGHT EVENT NOTE    Nursing reports patient is complaining of generalized itching.  Generalized itching began this morning but has progressively become more and more intense.  Nursing reports no associated rash.  Patient has not been started on any new medications today and is not on any antibiotics.  We will give a one-time dose of 25 mg of oral Benadryl now.  Continue to monitor.  Vernelle Emerald  MD [facility]   ADDENDUM (2/27 3am)  Patient continuing to complain of generalized pruritus without developing rash.  Etiology still unclear.  Management of this difficult due to unknown cause.  Chart reviewed once again.  No new medications initiated that would typically cause pruritus.  Patient was started on [place] on 2/26 however this does not typically cause pruritus.  No evidence of significant renal or liver disease.  Administering an additional dose of 25 mg of Benadryl, this time IV.  Also, topical [place] PRN.   Sherryll Burger Rollin Kotowski

## 2021-04-09 NOTE — Progress Notes (Signed)
Upon AM assessment, PM RN placed wrong sized Ostomy barrier and bag and placed barrier over patient's abdominal surgical site that was dehisced and bleeding. WOC order had already been placed for surgical site wound assessment, see note from 2/25. Informed Agricultural consultant and MD as removing the barrier and bag to replace with correct one may cause more trauma to tissue. Waiting on [place] for this reason.   Per WOC note Patient requires:  Ostomy pouching: 2pc, 2 and 1/4 inch ostomy pouching system with skin barrier ring:  Pouch is [employer] # [id number], Skin barrier is [employer] # [id number] and skin barrier ring is [employer] # [phone]

## 2021-04-09 NOTE — Progress Notes (Signed)
Speech Language Pathology Treatment: Dysphagia  Patient Details Name: Brittany [name] MRN: [id number] DOB: 05-12-1960 Today's Date: 04/09/2021 Time: 1425-1440 SLP Time Calculation (min) (ACUTE ONLY): 15 min  Assessment / Plan / Recommendation Clinical Impression  Patient seen by SLP for skilled treatment session focused on dysphagia goals. Both patient's RN and MD had secure message chatted with SLP to inform him of patient's improved alertness and good PO intake. When SLP arrived to patient's room, her sister was at bedside telling SLP "she's asleep now". Patient then fairly quickly turned around from side lying and was awake and alert. SLP observed her with PO intake of Rice Krispies cereal with milk and straaw sips of water. She exhibited prolonged mastication and mild amount of oral residuals right side buccal cavity. Residuals cleared with subsequent swallows and sips of liquids. No overt s/s aspiration or penetration observed and swallow initiation appeared timely. SLP is recommending to upgrade patient's diet to Dys 2 (fine chop) solids and thin liquids, patient and sister educated, Therapist, sports and MD informed. SLP will continue to follow patient for diet toleration and readiness to upgrade solids.    HPI HPI: 61 year old female with normal baseline of oriented x3 brought in by EMS for confusion plus DKA.  EEG noted subclinical seizure.  Patient started on Keppra and transferred to [facility] from [facility] regional for continuous 24-hour EEG.  Since arrival, patient has been grossly confused, trying to rip out lines, Foley catheter.  MRI 2/22 with no acute findings. Pt has past medical history of hypertension, diabetes mellitus, CVA with right-sided weakness, tobacco abuse, COPD and status post pancreatectomy and splenectomy.      SLP Plan  Continue with current plan of care      Recommendations for follow up therapy are one component of a multi-disciplinary discharge planning process, led by the  attending physician.  Recommendations may be updated based on patient status, additional functional criteria and insurance authorization.    Recommendations  Diet recommendations: Dysphagia 2 (fine chop);Thin liquid Liquids provided via: Cup;Straw Medication Administration: Crushed with puree Supervision: Staff to assist with self feeding;Trained caregiver to feed patient;Full supervision/cueing for compensatory strategies Compensations: Minimize environmental distractions;Slow rate;Small sips/bites Postural Changes and/or Swallow Maneuvers: Seated upright 90 degrees                Oral Care Recommendations: Oral care BID;Staff/trained caregiver to provide oral care Follow Up Recommendations: Skilled nursing-short term rehab (<3 hours/day) Assistance recommended at discharge: Frequent or constant Supervision/Assistance SLP Visit Diagnosis: Dysphagia, oropharyngeal phase (R13.12) Plan: Continue with current plan of care          Sonia Baller, MA, CCC-SLP Speech Therapy

## 2021-04-09 NOTE — Progress Notes (Signed)
Patient ate 100% of mashed potatoes, chicken and pears. Had 50% of Carrots and two bites of chocolate ice cream.

## 2021-04-09 NOTE — Assessment & Plan Note (Addendum)
Initially repleted during this hospitalization.  Potassium increased and was given Lokelma.  Benazepril discontinued.

## 2021-04-09 NOTE — Progress Notes (Addendum)
Pt. C/o generalized itching. Dr. Marlyce Huge paged for orders.

## 2021-04-09 NOTE — Assessment & Plan Note (Addendum)
Replaced.  Continue to monitor °

## 2021-04-09 NOTE — Progress Notes (Signed)
[facility] Progress Note  [name]    ZSW:[id number]  DOA: 04/05/2021    Date of Service: the patient was seen and examined on 04/09/2021  Brief hospital course: 61 year old female with past medical history of hypertension, diabetes mellitus, CVA with right-sided weakness, tobacco abuse, COPD and status post pancreatectomy and splenectomy with normal baseline of oriented x3 brought in by EMS for confusion plus DKA.  EEG noted subclinical seizure.  Also found to have incidental COVID.  Patient started on Keppra and transferred to [facility] from [facility] regional for continuous 24-hour EEG.  Following arrival, patient remained quite confused requiring restraints and medication.  DKA stabilized.  EEG noted previous episodes of seizure activity, now stabilized.    Following hospitalization, patient's mentation has continued to improve significantly and today she is quite interactive and appropriate.   Assessment and Plan: Assessment and Plan: * New onset seizure ([place]) Suspect underlying etiology is previous CVA due to lowered seizure threshold made worse by DKA/COVID.  No seizures currently, on Keppra.  Appreciate neurology help.  Acute metabolic encephalopathy- (present on admission) Patient has made big improvements.  She is quite lucid and interactive and appropriate.  Almost fully alert and oriented.  Answers questions properly and follows commands.  When asked about living conditions with her daughter, she does not relate this very well.  She has no memory of what happened when she was admitted.  COVID-19 virus infection- (present on admission) Incidental finding.  We will follow CRP levels.  DKA (diabetic ketoacidosis) ([place])- (present on admission) Resolved.  Treated with IV insulin and fluids.  Now on Lantus and sliding scale.  CBGs have been increasing dramatically now that she started to take her own personal p.o.  We will increase coverage.  Essential hypertension-  (present on admission) Neurology following.  Status epilepticus ruled out.  Protein-calorie malnutrition, severe- (present on admission) Nutrition Status: Nutrition Problem: Severe Malnutrition Etiology: chronic illness (cancer, lupus) Signs/Symptoms: severe muscle depletion, severe fat depletion, percent weight loss Percent weight loss: 16.5 % (x5 months) Seen by nutrition.  With mentation improving, patient placed on full liquids which she is tolerating.  Speech to follow-up.  Recurrent major depressive disorder ([place])- (present on admission) Stable.  Hypokalemia- (present on admission) Replacing as needed  Hypomagnesemia- (present on admission) Replacing as needed  COPD, mild ([place])- (present on admission) Stable.  Currently on room air       Body mass index is 18.77 kg/m.  Nutrition Problem: Severe Malnutrition Etiology: chronic illness (cancer, lupus)     Consultants: Neurology  Procedures: EEG  Antimicrobials: None  Code Status: Full code   Subjective: Much more alert and oriented, appropriate.  Objective: Markedly elevated blood pressures Vitals:   04/09/21 0453 04/09/21 1246  BP: 128/90 (!) 130/91  Pulse: 82 87  Resp: 16 17  Temp: 99.9 F (37.7 C) 97.8 F (36.6 C)  SpO2: 98% 100%    Intake/Output Summary (Last 24 hours) at 04/09/2021 1354 Last data filed at 04/09/2021 1337 Gross per 24 hour  Intake 1316.42 ml  Output 1100 ml  Net 216.42 ml    Filed Weights   04/07/21 0500 04/08/21 0413 04/09/21 0500  Weight: 40.1 kg 42.4 kg 43.6 kg   Body mass index is 18.77 kg/m.  Exam:  General: Awake, oriented x2, appropriate HEENT: Normocephalic and atraumatic, mucous membranes are dry Cardiovascular: Regular rate and rhythm, S1-S2 Respiratory: Clear to auscultation bilaterally Abdomen: Soft,?  Nontender, nondistended, hypoactive bowel sounds Musculoskeletal: No clubbing or  cyanosis or edema Psychiatry: No evidence of acute  psychoses Neurology: No focal deficits although difficult to assess  Data Reviewed: Noted hypokalemia  Disposition:  Status is: Inpatient Remains inpatient appropriate because: Further evaluation.  Plus new determination for home situation.  DSS is investigating    Family Communication: Discussed with patient's sister.  They are working on medical power of attorney.  They agree that patient is not safe to return to the care of her daughter and possibly will need a long-term care facility. DVT Prophylaxis:    SCDs   Author: Annita Brod ,MD 04/09/2021 1:54 PM  To reach On-call, see care teams to locate the attending and reach out via [email or web address]. Between 7PM-7AM, please contact night-coverage If you still have difficulty reaching the attending provider, please page the [facility] (Director on Call) for [facility] on amion for assistance.

## 2021-04-09 NOTE — Assessment & Plan Note (Addendum)
Stable.  Currently on room air --Continue albuterol MDI 1-2 puffs q4h PRN SOB/wheezing.

## 2021-04-10 LAB — COMPREHENSIVE METABOLIC PANEL
ALT: 29 U/L (ref 0–44)
AST: 28 U/L (ref 15–41)
Albumin: 2.1 g/dL — ABNORMAL LOW (ref 3.5–5.0)
Alkaline Phosphatase: 125 U/L (ref 38–126)
Anion gap: 7 (ref 5–15)
BUN: 8 mg/dL (ref 8–23)
CO2: 29 mmol/L (ref 22–32)
Calcium: 8.6 mg/dL — ABNORMAL LOW (ref 8.9–10.3)
Chloride: 101 mmol/L (ref 98–111)
Creatinine, Ser: 0.57 mg/dL (ref 0.44–1.00)
GFR, Estimated: >60 mL/min (ref >60)
Glucose, Bld: 167 mg/dL — ABNORMAL HIGH (ref 70–99)
Potassium: 4.2 mmol/L (ref 3.5–5.1)
Sodium: 137 mmol/L (ref 135–145)
Total Bilirubin: 0.3 mg/dL (ref 0.3–1.2)
Total Protein: 6 g/dL — ABNORMAL LOW (ref 6.5–8.1)

## 2021-04-10 LAB — GLUCOSE, CAPILLARY
Glucose-Capillary: 157 mg/dL — ABNORMAL HIGH (ref 70–99)
Glucose-Capillary: 187 mg/dL — ABNORMAL HIGH (ref 70–99)
Glucose-Capillary: 400 mg/dL — ABNORMAL HIGH (ref 70–99)
Glucose-Capillary: 366 mg/dL — ABNORMAL HIGH (ref 70–99)
Glucose-Capillary: 263 mg/dL — ABNORMAL HIGH (ref 70–99)
Glucose-Capillary: 426 mg/dL — ABNORMAL HIGH (ref 70–99)

## 2021-04-10 LAB — CULTURE, BLOOD (ROUTINE X 2): Culture: NO GROWTH

## 2021-04-10 MED ORDER — CAMPHOR-MENTHOL 0.5-0.5 % EX LOTN
TOPICAL_LOTION | CUTANEOUS | Status: DC | PRN
  Filled 2021-04-10 (×2): qty 222

## 2021-04-10 MED ORDER — GLUCERNA SHAKE PO LIQD
237.0000 mL | Freq: Three times a day (TID) | ORAL | Status: DC
  Administered 2021-04-11 – 2021-05-07 (×61): 237 mL via ORAL
  Filled 2021-04-10: qty 237

## 2021-04-10 MED ORDER — INSULIN ASPART 100 UNIT/ML IJ SOLN
0.0000 [IU] | Freq: Three times a day (TID) | INTRAMUSCULAR | Status: DC
  Administered 2021-04-10 (×2): 9 [IU] via SUBCUTANEOUS
  Administered 2021-04-11: 5 [IU] via SUBCUTANEOUS
  Administered 2021-04-11 – 2021-04-12 (×2): 7 [IU] via SUBCUTANEOUS
  Administered 2021-04-13: 9 [IU] via SUBCUTANEOUS
  Administered 2021-04-13 (×2): 2 [IU] via SUBCUTANEOUS
  Administered 2021-04-14: 1 [IU] via SUBCUTANEOUS
  Administered 2021-04-15: 2 [IU] via SUBCUTANEOUS
  Administered 2021-04-15: 5 [IU] via SUBCUTANEOUS
  Administered 2021-04-15: 7 [IU] via SUBCUTANEOUS
  Administered 2021-04-16 (×2): 5 [IU] via SUBCUTANEOUS
  Administered 2021-04-17: 7 [IU] via SUBCUTANEOUS
  Administered 2021-04-17: 1 [IU] via SUBCUTANEOUS
  Administered 2021-04-18: 5 [IU] via SUBCUTANEOUS
  Administered 2021-04-18: 2 [IU] via SUBCUTANEOUS
  Administered 2021-04-18: 7 [IU] via SUBCUTANEOUS
  Administered 2021-04-19: 3 [IU] via SUBCUTANEOUS

## 2021-04-10 MED ORDER — INSULIN ASPART 100 UNIT/ML IJ SOLN
0.0000 [IU] | Freq: Every day | INTRAMUSCULAR | Status: DC
  Administered 2021-04-10 – 2021-04-12 (×2): 3 [IU] via SUBCUTANEOUS
  Administered 2021-04-13: 2 [IU] via SUBCUTANEOUS
  Administered 2021-04-14: 3 [IU] via SUBCUTANEOUS
  Administered 2021-04-15: 2 [IU] via SUBCUTANEOUS
  Administered 2021-04-17: 5 [IU] via SUBCUTANEOUS
  Administered 2021-04-18: 3 [IU] via SUBCUTANEOUS

## 2021-04-10 MED ORDER — GLUCERNA 1.5 CAL PO LIQD
720.0000 mL | ORAL | Status: DC
  Filled 2021-04-10: qty 948

## 2021-04-10 MED ORDER — INSULIN GLARGINE-YFGN 100 UNIT/ML ~~LOC~~ SOLN
8.0000 [IU] | Freq: Every day | SUBCUTANEOUS | Status: DC
  Administered 2021-04-10 – 2021-04-11 (×2): 8 [IU] via SUBCUTANEOUS
  Filled 2021-04-10 (×3): qty 0.08

## 2021-04-10 MED ORDER — DIPHENHYDRAMINE HCL 50 MG/ML IJ SOLN
25.0000 mg | Freq: Once | INTRAMUSCULAR | Status: AC
  Administered 2021-04-10: 25 mg via INTRAVENOUS
  Filled 2021-04-10: qty 1

## 2021-04-10 NOTE — Progress Notes (Addendum)
Pt. CBG was rechecked after she ate more food and CBG: 426. Paged Dr. Marlyce Huge for orders. Per Dr. Marlyce Huge, no further treatment indicated at this time. Recheck at 0200 and notify for treatment if Pt. Still hyperglycemic.

## 2021-04-10 NOTE — Progress Notes (Signed)
For lunch patient ate 1 chicken leg, 1 container mashed potatoes and gravy, and 90% of a chicken potpie that her sister brought in from [facility].  For dinner patient ate 90% of chicken and gravy, 75% of potatoes, 10% green beans, 100% berries, 50 % chocolate magic cup.  Drank 140 ml of Orange Juice and 180 ml of ice tea with 1 sugar packet and 1 lemon packet.

## 2021-04-10 NOTE — Consult Note (Signed)
[facility] Nurse ostomy consult note Stoma type/location: LLQ colostomy  has ripped off pouch with medical adhesive related skin damage to peristomal skin (MARSI)  Stomal assessment/size: 2 " round pink and moist Peristomal assessment: partial thickness tissue loss with erythema Treatment options for stomal/peristomal skin:  stoma powder and skin prep  barrier ring Output soft brown stool Ostomy pouching: 2pc. 2 1/4" pouch with barrier ring stoma powder and skin prep to skin with barrier irng  Education provided: none  Enrolled patient in [facility] program: previously Will not follow at this time.  Please re-consult if needed.  Domenic Moras MSN, RN, FNP-BC CWON Wound, Ostomy, Continence Nurse Pager [phone]

## 2021-04-10 NOTE — Progress Notes (Addendum)
Subjective: No clinical seizures.  Did have itching overnight and received Benadryl.  Patient sister at bedside thinks it might be related to the body wash we used in the hospital.  Patient was crying, requesting to get out of bed.  States her body hurts because of being in bed.  ROS: negative except above   Examination  Vital signs in last 24 hours: Temp:  [97.8 F (36.6 C)-99.4 F (37.4 C)] 98.2 F (36.8 C) (02/27 0347) Pulse Rate:  [87-92] 92 (02/26 2326) Resp:  [17-18] 18 (02/27 0347) BP: (130-145)/(91-94) 145/92 (02/27 0347) SpO2:  [97 %-100 %] 97 % (02/27 0347)  General: lying in bed, NAD, bilateral wrist restraints in place Neuro: Awake, alert, oriented to place and person, time (yesterday was Sunday), follows simple one-step commands, PERRLA, extraocular movements intact, no apparent facial asymmetry, spontaneously moving upper extremities but does not cooperate with further strength testing.  Basic Metabolic Panel: Recent Labs  Lab 04/05/21 0824 04/05/21 1032 04/06/21 1556 04/07/21 1330 04/07/21 1844 04/08/21 0553 04/08/21 1624 04/09/21 0159 04/10/21 0229  NA 136   < > 137  --   --  135 135 135 137  K 3.4*   < > 2.9*  --   --  2.1* 3.7 3.8 4.2  CL 96*   < > 99  --   --  96* 98 100 101  CO2 18*   < > 26  --   --  30 27 25 29   GLUCOSE 460*   < > 98  --   --  302* 175* 116* 167*  BUN 12   < > <5*  --   --  <5* <5* 5* 8  CREATININE 0.94   < > 0.46  --   --  0.52 0.56 0.45 0.57  CALCIUM 8.5*   < > 7.8*  --   --  7.5* 7.9* 8.0* 8.6*  MG 1.6*  --   --  1.3* 1.3* 1.3* 2.2  --   --   PHOS  --   --   --  2.2* 2.5 3.5 3.4  --   --    < > = values in this interval not displayed.    CBC: Recent Labs  Lab 04/05/21 0824 04/06/21 0429 04/08/21 0553  WBC 10.9* 9.4 6.1  HGB 12.0 11.2* 11.6*  HCT 40.2 34.0* 34.8*  MCV 96.9 90.9 89.2  PLT 451* 408* 415*     Coagulation Studies: No results for input(s): LABPROT, INR in the last 72 hours.  Imaging No new brain  imaging overnight  ASSESSMENT AND PLAN: 61 year old woman with history of left posterior quadrant stroke with new onset seizures.       New onset epilepsy COVID Acute encephalopathy, improving Hyperammonemia -No seizures overnight.  Ammonia was slightly elevated at 39 which is unlikely to explain patient's presentation   Recommendations -Continue keppra 500mg   BID -Avoid sedating medications - seizure precautions - PRN Iv ativan for clinical sz - management of rest of comorbidity per peri team -Plan discussed with Dr. [place] Pink, family at bedside -Follow-up with neurology and 8 to 12 weeks  Seizure precautions: Per [facility] statutes, patients with seizures are not allowed to drive until they have been seizure-free for six months and cleared by a physician    Use caution when using heavy equipment or power tools. Avoid working on ladders or at heights. Take showers instead of baths. Ensure the water temperature is not too high on the home water heater.  Do not go swimming alone. Do not lock yourself in a room alone (i.e. bathroom). When caring for infants or small children, sit down when holding, feeding, or changing them to minimize risk of injury to the child in the event you have a seizure. Maintain good sleep hygiene. Avoid alcohol.    If patient has another seizure, call 911 and bring them back to the ED if: A.  The seizure lasts longer than 5 minutes.      B.  The patient doesn't wake shortly after the seizure or has new problems such as difficulty seeing, speaking or moving following the seizure C.  The patient was injured during the seizure D.  The patient has a temperature over 102 F (39C) E.  The patient vomited during the seizure and now is having trouble breathing    During the Seizure   - First, ensure adequate ventilation and place patients on the floor on their left side  Loosen clothing around the neck and ensure the airway is patent. If the patient is  clenching the teeth, do not force the mouth open with any object as this can cause severe damage - Remove all items from the surrounding that can be hazardous. The patient may be oblivious to what's happening and may not even know what he or she is doing. If the patient is confused and wandering, either gently guide him/her away and block access to outside areas - Reassure the individual and be comforting - Call 911. In most cases, the seizure ends before EMS arrives. However, there are cases when seizures may last over 3 to 5 minutes. Or the individual may have developed breathing difficulties or severe injuries. If a pregnant patient or a person with diabetes develops a seizure, it is prudent to call an ambulance. - Finally, if the patient does not regain full consciousness, then call EMS. Most patients will remain confused for about 45 to 90 minutes after a seizure, so you must use judgment in calling for help. - Avoid restraints but make sure the patient is in a bed with padded side rails - Place the individual in a lateral position with the neck slightly flexed; this will help the saliva drain from the mouth and prevent the tongue from falling backward - Remove all nearby furniture and other hazards from the area - Provide verbal assurance as the individual is regaining consciousness - Provide the patient with privacy if possible - Call for help and start treatment as ordered by the caregiver    After the Seizure (Postictal Stage)   After a seizure, most patients experience confusion, fatigue, muscle pain and/or a headache. Thus, one should permit the individual to sleep. For the next few days, reassurance is essential. Being calm and helping reorient the person is also of importance.   Most seizures are painless and end spontaneously. Seizures are not harmful to others but can lead to complications such as stress on the lungs, brain and the heart. Individuals with prior lung problems may  develop labored breathing and respiratory distress.     Thank you for allowing [place] to participate in the care of this patient.  Neurology will sign off.  Please call us for any further questions.   Zeb Comfort Epilepsy [facility] For questions after 5pm please refer to AMION to reach the Neurologist on call

## 2021-04-10 NOTE — Progress Notes (Addendum)
[facility] Progress Note  [name]    TKZ:[id number]  DOA: 04/05/2021    Date of Service: the patient was seen and examined on 04/10/2021  Brief hospital course: 61 year old female with past medical history of hypertension, diabetes mellitus, CVA with right-sided weakness, tobacco abuse, COPD and status post pancreatectomy and splenectomy with normal baseline of oriented x3 brought in by EMS for confusion plus DKA.  EEG noted subclinical seizure.  Also found to have incidental COVID.  Patient started on Keppra and transferred to [facility] from [facility] regional for continuous 24-hour EEG.  Following arrival, patient remained quite confused requiring restraints and medication.  DKA stabilized.  EEG noted previous episodes of seizure activity, now stabilized.    Following hospitalization, patient's mentation has continued to improve significantly.  Still remains quite deconditioned.  Patient able to take in more by mouth and core track tube clogged so being discontinued.  Assessment and Plan: Assessment and Plan: * New onset seizure ([place]) Suspect underlying etiology is previous CVA due to lowered seizure threshold made worse by DKA/COVID.  No seizures currently, on Keppra.  Appreciate neurology help.  Patient will follow-up with neurology in 2 to 3 months.  Acute metabolic encephalopathy- (present on admission) Patient has made big improvements.  She is quite lucid and interactive and appropriate.  Almost fully alert and oriented.  Answers questions properly and follows commands.  She clarifies that she does not live with her daughter, but rather she owns a home in [place] and her daughter lives with her.  When I explained that she is not currently in a good environment, she is amenable to going to short-term skilled nursing for rehab and then perhaps living with her sister for a while, but her house needs to be made safer.  COVID-19 virus infection- (present on  admission) Incidental finding.  We will follow CRP levels.  DKA (diabetic ketoacidosis) ([place])- (present on admission) Resolved.  Treated with IV insulin and fluids.  Now on Lantus and sliding scale.  CBGs have been increasing dramatically now that she started to take her own personal p.o.  We will increase coverage.  Essential hypertension- (present on admission) Neurology following.  Status epilepticus ruled out.  Protein-calorie malnutrition, severe- (present on admission) Nutrition Status: Nutrition Problem: Severe Malnutrition Etiology: chronic illness (cancer, lupus) Signs/Symptoms: severe muscle depletion, severe fat depletion, percent weight loss Percent weight loss: 16.5 % (x5 months) Seen by nutrition.  With mentation improving, patient placed on full liquids and then to dysphagia 2 diet.  Patient is taking a lot in by p.o.  Nutrition had plans to keep NG tube in for now to ensure adequate calorie intake, but now tube clogged so we will discontinue it  Recurrent major depressive disorder ([place])- (present on admission) Stable.  Hypokalemia- (present on admission) Replacing as needed  Hypomagnesemia- (present on admission) Replacing as needed  COPD, mild ([place])- (present on admission) Stable.  Currently on room air       Body mass index is 18.77 kg/m.  Nutrition Problem: Severe Malnutrition Etiology: chronic illness (cancer, lupus)     Consultants: Neurology  Procedures: EEG  Antimicrobials: None  Code Status: Full code   Subjective: Much more alert and oriented, appropriate.  Slightly tearful and wants to get up.  Objective: Markedly elevated blood pressures Vitals:   04/10/21 0730 04/10/21 1145  BP: 139/90 (!) 142/92  Pulse: 84 92  Resp: 18 18  Temp: 98.2 F (36.8 C) 98.4 F (36.9 C)  SpO2:  100% 98%    Intake/Output Summary (Last 24 hours) at 04/10/2021 1656 Last data filed at 04/09/2021 2220 Gross per 24 hour  Intake --  Output 350 ml  Net  -350 ml   Filed Weights   04/07/21 0500 04/08/21 0413 04/09/21 0500  Weight: 40.1 kg 42.4 kg 43.6 kg   Body mass index is 18.77 kg/m.  Exam:  General: Awake, oriented x2, appropriate HEENT: Normocephalic and atraumatic, mucous membranes are dry Cardiovascular: Regular rate and rhythm, S1-S2 Respiratory: Clear to auscultation bilaterally Abdomen: Soft,?  Nontender, nondistended, hypoactive bowel sounds Musculoskeletal: No clubbing or cyanosis or edema Psychiatry: No evidence of acute psychoses Neurology: No focal deficits although difficult to assess  Data Reviewed: Noted hypokalemia  Disposition:  Status is: Inpatient Remains inpatient appropriate because: Assessment by PT and OT and will need short-term skilled nursing    Family Communication: Discussed with patient's sister.  They are working on medical power of attorney.  They agree that patient is not safe to return to her home which appears unsanitary and not being well kept by her daughter.  Plan is for patient go to skilled nursing short-term and then from there we will likely stay with her sister for some time. DVT Prophylaxis:    SCDs   Author: Annita Brod ,MD 04/10/2021 4:56 PM  To reach On-call, see care teams to locate the attending and reach out via [email or web address]. Between 7PM-7AM, please contact night-coverage If you still have difficulty reaching the attending provider, please page the [facility] (Director on Call) for [facility] on amion for assistance.

## 2021-04-10 NOTE — Progress Notes (Addendum)
Nutrition Follow-up  DOCUMENTATION CODES:  Severe malnutrition in context of chronic illness, Underweight  INTERVENTION:  Recommend continue TF via Cortrak until pt consistently meeting >75% estimated needs via PO intake  -transition to nocturnal TF via Cortrak: Provide Glucerna 1.5 @ 55ml/hr (78ml/day) to provide 1080 kcals, 59 grams protein, 577ml free water, ~96 grams carbohydrate  Meets 77% kcal needs and 84% protein needs  -Initiate 48 hour calorie count (run from 2/28-3/01) -Glucerna Shake po TID, each supplement provides 220 kcal and 10 grams of protein, 16 grams carbohydrate  NUTRITION DIAGNOSIS:  Severe Malnutrition related to chronic illness (cancer, lupus) as evidenced by severe muscle depletion, severe fat depletion, percent weight loss. -- ongoing  GOAL:  Patient will meet greater than or equal to 90% of their needs -- progressing  MONITOR:  Diet advancement, Labs, Weight trends, Skin, I & O's  REASON FOR ASSESSMENT:  Malnutrition Screening Tool    ASSESSMENT:  Pt with PMH significant for DM, HTN, lupus, Ca, CVA w/ R-sided weakness, COPD, and s/p pancreatectomy and splenectomy admitted with acute metabolic encephalopathy and DKA. EEG noted subclinical seizure.  2/24 - Cortrak placed (gastric tip confirmed via xray) 2/25 - diet advanced to full liquids 2/26 - diet advanced to dysphagia 2 with thin liquids  Pt with improved mentation since last RD visit. No PO intake documented at this time. Plan to transition pt to nocturnal TF and initiate calorie count tomorrow after TF has been turned off. Recommend continue TF via Cortrak until pt consistently meeting >75% estimated needs via PO intake  Current TF: Osmolite 1.2 @ 48ml/hr via Cortrak  Medications: SSI TID w/ meals and bedtime, 8 units semglee daily, creon TID w/ meals, thiamine, keppra Labs reviewed.  CBGs: 157-397 x24 hours - diabetes coordinator consult is pending A1c (last taken 11/06/20) 15.3  UOP: 5x  unmeasured occurrences x24 hours Colostomy output: 669ml x24 hours I/O: + 6.6L since admit  Admit wt 40.9 kg Current wt 43.6 kg  Diet Order:   Diet Order             DIET DYS 2 Room service appropriate? Yes; Fluid consistency: Thin  Diet effective now                  EDUCATION NEEDS:  Not appropriate for education at this time  Skin:  Skin Assessment: Skin Integrity Issues: Skin Integrity Issues:: Incisions Incisions: abdomen (from previous admit)  Last BM:  2/27 via colostomy  Height:  Ht Readings from Last 1 Encounters:  04/05/21 5' (1.524 m)   Weight:  Wt Readings from Last 1 Encounters:  04/09/21 43.6 kg   BMI:  Body mass index is 18.77 kg/m.  Estimated Nutritional Needs:  Kcal:  1400-1600 Protein:  70-80 grams Fluid:  >1.4L   Theone Stanley., MS, RD, LDN (she/her/hers) RD pager number and weekend/on-call pager number located in [place].

## 2021-04-11 LAB — GLUCOSE, CAPILLARY
Glucose-Capillary: 439 mg/dL — ABNORMAL HIGH (ref 70–99)
Glucose-Capillary: 61 mg/dL — ABNORMAL LOW (ref 70–99)
Glucose-Capillary: 129 mg/dL — ABNORMAL HIGH (ref 70–99)
Glucose-Capillary: 348 mg/dL — ABNORMAL HIGH (ref 70–99)
Glucose-Capillary: 266 mg/dL — ABNORMAL HIGH (ref 70–99)
Glucose-Capillary: 460 mg/dL — ABNORMAL HIGH (ref 70–99)

## 2021-04-11 LAB — VITAMIN B1: Vitamin B1 (Thiamine): 119 nmol/L (ref 66.5–200.0)

## 2021-04-11 MED ORDER — INSULIN ASPART 100 UNIT/ML IJ SOLN
7.0000 [IU] | Freq: Once | INTRAMUSCULAR | Status: AC
  Administered 2021-04-11: 7 [IU] via SUBCUTANEOUS

## 2021-04-11 MED ORDER — INSULIN ASPART 100 UNIT/ML IJ SOLN
9.0000 [IU] | Freq: Once | INTRAMUSCULAR | Status: AC
  Administered 2021-04-11: 9 [IU] via SUBCUTANEOUS

## 2021-04-11 MED ORDER — DEXTROSE 50 % IV SOLN
12.5000 g | INTRAVENOUS | Status: AC
  Administered 2021-04-11: 12.5 g via INTRAVENOUS

## 2021-04-11 MED ORDER — ADULT MULTIVITAMIN W/MINERALS CH
1.0000 | ORAL_TABLET | Freq: Every day | ORAL | Status: DC
  Administered 2021-04-11 – 2021-05-09 (×28): 1 via ORAL
  Filled 2021-04-11 (×28): qty 1

## 2021-04-11 NOTE — Evaluation (Signed)
Occupational Therapy Evaluation Patient Details Name: Brittany [name] MRN: [id number] DOB: 1961/01/10 Today's Date: 04/11/2021   History of Present Illness Brittany [name] is a 59yoF who was brought to the ED for confusion plus DKA.  EEG noted subclinical seizure.  Also found to have incidental COVID. 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 Pt admitted with diverticulitis of intestine with abscess.   Clinical Impression   Brittany [name] was evaluated s/p the above admission list, at baseline she lives with her daughter and is generally mod I (per pt). Per chart, her home situation is unsafe for living, and the plan is to d/c to SNF and eventually live with sister until a long-term solution is made. Per pt she ambulates with a RW at baseline and completes all ADLs/IADLs indep, pt is a poor historian this date therefore information needs to be confirmed. Upon evaluation pt was self feeding with set up and cues, and was hyperfocused with hunger throughout the entire session, difficult to re-direct. Overall pt is generally weak, more weak on the R compared to L with poor coordination and inattention to the R side. Pt had R LOB with RW, and required minA and needed constant cues to [place]/locate items to her R. Pt requires min -mod A fro ADLs this date. OT to continue to follow acutely to address the limitation listed below. Recommend d/c to SNF for continued therapies.      Recommendations for follow up therapy are one component of a multi-disciplinary discharge planning process, led by the attending physician.  Recommendations may be updated based on patient status, additional functional criteria and insurance authorization.   Follow Up Recommendations  Skilled nursing-short term rehab (<3 hours/day)    Assistance Recommended at Discharge Frequent or constant Supervision/Assistance   Patient can return home with the following A little help with walking and/or transfers;A little help with bathing/dressing/bathroom;Assistance with cooking/housework;Assistance with feeding;Direct supervision/assist for medications management;Direct supervision/assist for financial management;Assist for transportation;Help with stairs or ramp for entrance    Functional Status Assessment  Patient has had a recent decline in their functional status and demonstrates the ability to make significant improvements in function in a reasonable and predictable amount of time.  Equipment Recommendations  None recommended by OT       Precautions / Restrictions Precautions Precautions: Fall Precaution Comments: seizure, COVID+ Restrictions Weight Bearing Restrictions: No      Mobility Bed Mobility Overal bed mobility: Needs Assistance             General bed mobility comments: in chiar upon arrival    Transfers Overall transfer level: Needs assistance Equipment used: Rolling walker (2 wheels) Transfers: Sit to/from Stand Sit to Stand: Min assist                  Balance Overall balance assessment: Needs assistance Sitting-balance support: Feet supported Sitting balance-Leahy Scale: Fair     Standing balance support: No upper extremity supported, During functional activity Standing balance-Leahy Scale: Fair Standing balance comment: statically standing without UE support                           ADL either performed or assessed with clinical judgement   ADL Overall ADL's : Needs assistance/impaired Eating/Feeding: Set up;Sitting Eating/Feeding Details (indicate cue type and reason): dys 2 Grooming: Minimal assistance;Standing   Upper  Body Bathing: Minimal assistance;Sitting   Lower Body Bathing: Moderate assistance;Sit to/from stand   Upper Body Dressing : Minimal assistance;Sitting   Lower Body Dressing: Minimal assistance;Sit to/from stand   Toilet  Transfer: Minimal assistance;Ambulation;Rolling walker (2 wheels)   Toileting- Clothing Manipulation and Hygiene: Min guard;Sitting/lateral lean       Functional mobility during ADLs: Minimal assistance;Rolling walker (2 wheels) General ADL Comments: required cues throughout, assist for safety, R inattention, R LOB & sequencing     Vision Baseline Vision/History: 0 No visual deficits Ability to See in Adequate Light: 0 Adequate Patient Visual Report: No change from baseline Vision Assessment?: Vision impaired- to be further tested in functional context Additional Comments: needs further testing, pt with difficulty following commands this session. Noted some R inattention during functional tasks            Pertinent Vitals/Pain Pain Assessment Pain Assessment: No/denies pain     Hand Dominance Right   Extremity/Trunk Assessment Upper Extremity Assessment Upper Extremity Assessment: Defer to OT evaluation RUE Deficits / Details: limited over head ROM. using functionally for feeding with utencils. strength generally 4/5. difficult to fully assess due to impaired cognition. RUE Sensation: decreased light touch RUE Coordination: decreased fine motor;decreased gross motor   Lower Extremity Assessment Lower Extremity Assessment: Generalized weakness;RLE deficits/detail RLE Deficits / Details: grossly 3+ or better for Rt LE strength at hip, knee, and ankle. coordination impaired and decreased propriocetion. RLE Sensation: decreased proprioception RLE Coordination: decreased gross motor   Cervical / Trunk Assessment Cervical / Trunk Assessment: Normal   Communication Communication Communication: Expressive difficulties   Cognition Arousal/Alertness: Awake/alert Behavior During Therapy: Flat affect Overall Cognitive Status: Impaired/Different from baseline Area of Impairment: Memory, Attention, Following commands, Safety/judgement, Awareness, Problem solving                    Current Attention Level: Sustained Memory: Decreased short-term memory Following Commands: Follows one step commands with increased time Safety/Judgement: Decreased awareness of safety, Decreased awareness of deficits Awareness: Intellectual Problem Solving: Slow processing, Difficulty sequencing, Requires verbal cues, Decreased initiation General Comments: requires simple cues and increased time to initiate. unable to follow 2 step command. poor attention to R environment. poor insight to deficits and safety     General Comments  HR in 90's with gait            Home Living Family/patient expects to be discharged to:: Private residence Living Arrangements: Children;Other relatives Available Help at Discharge: Family;Available PRN/intermittently Type of Home: House Home Access: Stairs to enter [employer] of Steps: 2 Entrance Stairs-Rails: Right;Left Home Layout: One level     Bathroom Shower/Tub: Occupational psychologist: Standard Bathroom Accessibility: Yes   Home Equipment: Cane - single point;Shower seat;Grab bars - tub/shower;Hand held shower head   Additional Comments: Per chart - plan if for ST SNF and then to sisters home. PLOF reflects pt's home.      Prior Functioning/Environment Prior Level of Function : Independent/Modified Independent;History of Falls (last six months)             Mobility Comments: uses RW for all mobility ADLs Comments: reports indep        OT Problem List: Decreased strength;Decreased range of motion;Decreased activity tolerance;Impaired balance (sitting and/or standing);Decreased safety awareness;Decreased cognition;Decreased knowledge of precautions      OT Treatment/Interventions: Self-care/ADL training;Therapeutic exercise;Therapeutic activities;Patient/family education;Balance training;DME and/or AE instruction    OT Goals(Current goals can be found in the care plan section)  Acute Rehab OT  Goals Patient Stated Goal: home OT Goal Formulation: With patient Time For Goal Achievement: 04/25/21 Potential to Achieve Goals: Good ADL Goals Pt Will Perform Upper Body Dressing: Independently;sitting Pt Will Perform Lower Body Dressing: with modified independence;sit to/from stand Pt Will Transfer to Toilet: ambulating;with modified independence Additional ADL Goal #1: Pt will locate 3/3 obljects placed to her R for grooming independently.  OT Frequency: Min 2X/week       AM-PAC OT "6 Clicks" Daily Activity     Outcome Measure Help from another person eating meals?: A Little Help from another person taking care of personal grooming?: A Little Help from another person toileting, which includes using toliet, bedpan, or urinal?: A Little Help from another person bathing (including washing, rinsing, drying)?: A Little Help from another person to put on and taking off regular upper body clothing?: A Little Help from another person to put on and taking off regular lower body clothing?: A Little 6 Click Score: 18   End of Session Equipment Utilized During Treatment: Gait belt;Rolling walker (2 wheels) Nurse Communication: Mobility status  Activity Tolerance: Patient tolerated treatment well Patient left: in chair;with call bell/phone within reach;with chair alarm set  OT Visit Diagnosis: Unsteadiness on feet (R26.81);Other abnormalities of gait and mobility (R26.89);Muscle weakness (generalized) (M62.81)                Time: 0034-9179 OT Time Calculation (min): 20 min Charges:  OT General Charges $OT Visit: 1 Visit OT Evaluation $OT Eval Moderate Complexity: 1 Mod   Isabellah Sobocinski A Lakoda Mcanany 04/11/2021, 10:09 AM

## 2021-04-11 NOTE — Progress Notes (Signed)
[facility]                                                                               Brittany [name], is a 61 y.o. female, DOB - March 23, 1960, ASN:[id number] Admit date - 04/05/2021    Outpatient Primary MD for the patient is [facility], [facility]  LOS - 6  days    Brief summary   61 year old female with past medical history of hypertension, diabetes mellitus, CVA with right-sided weakness, tobacco abuse, COPD and status post pancreatectomy and splenectomy with normal baseline of oriented x3 brought in by EMS for confusion plus DKA.  EEG noted subclinical seizure.  Also found to have incidental COVID.  Patient started on Keppra and transferred to [facility] from [facility] for continuous 24-hour EEG.  Following arrival, patient remained quite confused requiring restraints and medication.  DKA stabilized.  EEG noted previous episodes of seizure activity, now stabilized.  Neurology on board.  Patient currently on Keppra.  Therapy evaluations recommending SNF.    Assessment & Plan    Assessment and Plan: * New onset seizure ([place]) Suspect underlying etiology is previous CVA due to lowered seizure threshold made worse by DKA/COVID.  No seizures currently, on Keppra.  Appreciate neurology help.  Patient will follow-up with neurology in 2 to 3 months.  Protein-calorie malnutrition, severe- (present on admission) Nutrition Status: Nutrition Problem: Severe Malnutrition Etiology: chronic illness (cancer, lupus) Signs/Symptoms: severe muscle depletion, severe fat depletion, percent weight loss Percent weight loss: 16.5 % (x5 months) Seen by nutrition.  With mentation improving, patient is on dysphagia 2 diet with thin liquids  Acute metabolic encephalopathy- (present on admission) Appears to have improved she is alert and following commands and working with physical therapy.  She is amenable to going to short-term skilled nursing for rehab and then perhaps living with  her sister for a while, but her house needs to be made safer.  DKA (diabetic ketoacidosis) ([place])- (present on admission) Resolved.  Continue with sliding scale insulin and Lantus.  COVID-19 virus infection- (present on admission) Incidental finding.  We will follow CRP levels.  Hypomagnesemia- (present on admission) Replacing as needed  Hypokalemia- (present on admission) Replacing as needed  Recurrent major depressive disorder ([place])- (present on admission) Stable.  Essential hypertension- (present on admission) Blood pressure parameters are optimal  Type 2 diabetes mellitus with hyperlipidemia ([place])- (present on admission) Continue with sliding scale insulin and Semglee 18 units daily at bedtime CBG (last 3)  Recent Labs    04/11/21 0145 04/11/21 0610 04/11/21 0643  GLUCAP 439* 61* 129*   An episode of hypoglycemia of 61 around 6 AM probably secondary to 9 units of NovoLog given around 3 AM. No changes in medications at this time continue to monitor. Obtain hemoglobin A1c  COPD, mild ([place])- (present on admission) Stable.  Currently on room air         RN Pressure Injury Documentation:    Malnutrition Type:  Nutrition Problem: Severe Malnutrition Etiology: chronic illness (cancer, lupus)   Malnutrition Characteristics:  Signs/Symptoms: severe muscle depletion, severe fat depletion, percent weight loss Percent weight loss: 16.5 % (x5 months)   Nutrition Interventions:  Interventions:  Refer to RD note for recommendations  Estimated body mass index is 18.77 kg/m as calculated from the following:   Height as of this encounter: 5' (1.524 m).   Weight as of this encounter: 43.6 kg.  Code Status: Full code DVT Prophylaxis:    Lovenox   Level of Care: Level of care: Med-Surg Family Communication: None at bedside  Disposition Plan:     Remains inpatient appropriate:  waiting for SNF.   Procedures:  EEG Consultants:    Neurology  EEG Antimicrobials:   Anti-infectives (From admission, onward)    None        Medications  Scheduled Meds:  atorvastatin  40 mg Per Tube QHS   benazepril  10 mg Per Tube Daily   enoxaparin (LOVENOX) injection  20 mg Subcutaneous Q24H   feeding supplement (GLUCERNA SHAKE)  237 mL Oral TID BM   insulin aspart  0-5 Units Subcutaneous QHS   insulin aspart  0-9 Units Subcutaneous TID WC   insulin glargine-yfgn  8 Units Subcutaneous QHS   levETIRAcetam  500 mg Per Tube BID   lipase/protease/amylase  72,000 Units Oral TID with meals   multivitamin with minerals  1 tablet Oral Daily   thiamine  100 mg Per Tube Daily   Continuous Infusions: PRN Meds:.acetaminophen **OR** acetaminophen, albuterol, camphor-menthol, dextrose, LORazepam, LORazepam, polyethylene glycol    Subjective:   Brittany [name] was seen and examined today.  She denies any chest pain or shortness of breath and working with PT.  Objective:   Vitals:   04/10/21 2106 04/10/21 2340 04/11/21 0450 04/11/21 0825  BP: 129/88 127/85 (!) 125/98 132/90  Pulse:      Resp: 18 18 18    Temp: 98.5 F (36.9 C) 98.4 F (36.9 C) 98 F (36.7 C) 98.7 F (37.1 C)  TempSrc: Axillary Axillary Axillary Oral  SpO2: 97% 100% 98% 100%  Weight:      Height:        Intake/Output Summary (Last 24 hours) at 04/11/2021 1057 Last data filed at 04/11/2021 0625 Gross per 24 hour  Intake --  Output 750 ml  Net -750 ml   Filed Weights   04/07/21 0500 04/08/21 0413 04/09/21 0500  Weight: 40.1 kg 42.4 kg 43.6 kg     Exam General exam: Appears calm and comfortable  Respiratory system: Clear to auscultation. Respiratory effort normal. Cardiovascular system: S1 & S2 heard, RRR. No JVD, No pedal edema. Gastrointestinal system: Abdomen is nondistended, soft and nontender.  Normal bowel sounds heard. Central nervous system: Alert and oriented. No focal neurological deficits. Extremities: Symmetric 5 x 5 power. Skin: No rashes, lesions  or ulcers Psychiatry:Mood & affect appropriate.    Data Reviewed:  I have personally reviewed following labs and imaging studies   CBC Lab Results  Component Value Date   WBC 6.1 04/08/2021   RBC 3.90 04/08/2021   HGB 11.6 (L) 04/08/2021   HCT 34.8 (L) 04/08/2021   MCV 89.2 04/08/2021   MCH 29.7 04/08/2021   PLT 415 (H) 04/08/2021   MCHC 33.3 04/08/2021   RDW 16.3 (H) 04/08/2021   LYMPHSABS 3.4 12/25/2020   MONOABS 0.6 12/25/2020   EOSABS 0.0 12/25/2020   BASOSABS 0.1 33/54/5625     Last metabolic panel Lab Results  Component Value Date   NA 137 04/10/2021   K 4.2 04/10/2021   CL 101 04/10/2021   CO2 29 04/10/2021   BUN 8 04/10/2021   CREATININE 0.57 04/10/2021   GLUCOSE 167 (H) 04/10/2021  GFRNONAA >60 04/10/2021   GFRAA >60 11/02/2019   CALCIUM 8.6 (L) 04/10/2021   PHOS 3.4 04/08/2021   PROT 6.0 (L) 04/10/2021   ALBUMIN 2.1 (L) 04/10/2021   BILITOT 0.3 04/10/2021   ALKPHOS 125 04/10/2021   AST 28 04/10/2021   ALT 29 04/10/2021   ANIONGAP 7 04/10/2021    CBG (last 3)  Recent Labs    04/11/21 0145 04/11/21 0610 04/11/21 0643  GLUCAP 439* 61* 129*      Coagulation Profile: Recent Labs  Lab 04/05/21 0824  INR 1.0     Radiology Studies: No results found.     Hosie Poisson M.D. [facility] 04/11/2021, 10:57 AM  Available via Epic secure chat 7am-7pm After 7 pm, please refer to night coverage provider listed on amion.

## 2021-04-11 NOTE — Progress Notes (Signed)
Notified Dr. Marlyce Huge of CBG: 439.
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
## 2021-04-11 NOTE — Assessment & Plan Note (Addendum)
HbA1c 15.0> poor control. Suspect noncompliance. --Semglee 15 units daily. --NovoLog 2 units TID AC --Sliding scale for coverage --CBGs qAC/HS

## 2021-04-11 NOTE — Progress Notes (Signed)
Nutrition Follow-up  DOCUMENTATION CODES:  Severe malnutrition in context of chronic illness, Underweight  INTERVENTION:  -d/c calorie count and TF orders as cortrak was removed yesterday 2/2 being clogged and pt with good PO intake  -continue Glucerna Shake po TID, each supplement provides 220 kcal and 10 grams of protein, 16 grams carbohydrate -MVI with minerals daily   NUTRITION DIAGNOSIS:  Severe Malnutrition related to chronic illness (cancer, lupus) as evidenced by severe muscle depletion, severe fat depletion, percent weight loss. -- ongoing  GOAL:  Patient will meet greater than or equal to 90% of their needs -- progressing  MONITOR:  Diet advancement, Labs, Weight trends, Skin, I & O's  REASON FOR ASSESSMENT:  Malnutrition Screening Tool    ASSESSMENT:  Pt with PMH significant for DM, HTN, lupus, Ca, CVA w/ R-sided weakness, COPD, and s/p pancreatectomy and splenectomy admitted with acute metabolic encephalopathy and DKA. EEG noted subclinical seizure.  2/24 - Cortrak placed (gastric tip confirmed via xray) 2/25 - diet advanced to full liquids 2/26 - diet advanced to dysphagia 2 with thin liquids 2/27 - cortrak removed  Pt's mentation continues to improve. Discussed pt with MD who ordered Cortrak to be removed yesterday evening after RN reported Cortrak became clogged. Per MD, pt has been having food brought in from outside (not documented in EMR) and has been doing well with these meals. Will discontinue TF orders and calorie count. Recommend continue oral nutrition supplement given pt is malnourished with increased nutrient needs.   Medications: SSI TID w/ meals and bedtime, 8 units semglee daily, creon TID w/ meals, thiamine, keppra Labs reviewed.  CBGs: 61-439 x24 hours - diabetes coordinator consult is pending A1c (last taken 11/06/20) 15.3  UOP: 1x unmeasured occurrence x24 hours Colostomy output: 710ml x24 hours I/O: + 5.9L since admit  Admit wt 43.6  kg Current wt 43.6 kg  Diet Order:   Diet Order             DIET DYS 2 Room service appropriate? No; Fluid consistency: Thin  Diet effective now                  EDUCATION NEEDS:  Not appropriate for education at this time  Skin:  Skin Assessment: Skin Integrity Issues: Skin Integrity Issues:: Incisions Incisions: abdomen (from previous admit)  Last BM:  2/27 via colostomy  Height:  Ht Readings from Last 1 Encounters:  04/05/21 5' (1.524 m)   Weight:  Wt Readings from Last 1 Encounters:  04/09/21 43.6 kg   BMI:  Body mass index is 18.77 kg/m.  Estimated Nutritional Needs:  Kcal:  1400-1600 Protein:  70-80 grams Fluid:  >1.4L   Theone Stanley., MS, RD, LDN (she/her/hers) RD pager number and weekend/on-call pager number located in [place].

## 2021-04-12 LAB — GLUCOSE, CAPILLARY
Glucose-Capillary: 315 mg/dL — ABNORMAL HIGH (ref 70–99)
Glucose-Capillary: 440 mg/dL — ABNORMAL HIGH (ref 70–99)
Glucose-Capillary: 490 mg/dL — ABNORMAL HIGH (ref 70–99)
Glucose-Capillary: 300 mg/dL — ABNORMAL HIGH (ref 70–99)

## 2021-04-12 LAB — GLUCOSE, RANDOM: Glucose, Bld: 346 mg/dL — ABNORMAL HIGH (ref 70–99)

## 2021-04-12 LAB — CREATININE, SERUM
Creatinine, Ser: 0.71 mg/dL (ref 0.44–1.00)
GFR, Estimated: >60 mL/min (ref >60)

## 2021-04-12 MED ORDER — INSULIN ASPART 100 UNIT/ML IJ SOLN
12.0000 [IU] | Freq: Once | INTRAMUSCULAR | Status: AC
  Administered 2021-04-12: 12 [IU] via SUBCUTANEOUS

## 2021-04-12 MED ORDER — BENAZEPRIL HCL 20 MG PO TABS
10.0000 mg | ORAL_TABLET | Freq: Every day | ORAL | Status: DC
  Administered 2021-04-12 – 2021-04-16 (×5): 10 mg via ORAL
  Filled 2021-04-12 (×4): qty 1

## 2021-04-12 MED ORDER — INSULIN GLARGINE-YFGN 100 UNIT/ML ~~LOC~~ SOLN
15.0000 [IU] | Freq: Every day | SUBCUTANEOUS | Status: DC
  Administered 2021-04-12: 15 [IU] via SUBCUTANEOUS
  Filled 2021-04-12 (×3): qty 0.15

## 2021-04-12 MED ORDER — HYDROXYZINE HCL 10 MG PO TABS
10.0000 mg | ORAL_TABLET | Freq: Three times a day (TID) | ORAL | Status: DC | PRN
  Administered 2021-04-12 – 2021-04-13 (×3): 10 mg via ORAL
  Filled 2021-04-12 (×6): qty 1

## 2021-04-12 MED ORDER — GERHARDT'S BUTT CREAM
TOPICAL_CREAM | Freq: Four times a day (QID) | CUTANEOUS | Status: DC
  Administered 2021-04-18 – 2021-05-08 (×5): 1 via TOPICAL
  Filled 2021-04-12 (×3): qty 1

## 2021-04-12 MED ORDER — LEVETIRACETAM 100 MG/ML PO SOLN
500.0000 mg | Freq: Two times a day (BID) | ORAL | Status: DC
  Administered 2021-04-12 – 2021-04-19 (×15): 500 mg via ORAL
  Filled 2021-04-12 (×14): qty 5

## 2021-04-12 MED ORDER — INSULIN ASPART 100 UNIT/ML IJ SOLN
4.0000 [IU] | Freq: Three times a day (TID) | INTRAMUSCULAR | Status: DC
  Administered 2021-04-12: 4 [IU] via SUBCUTANEOUS

## 2021-04-12 MED ORDER — ROPINIROLE HCL 1 MG PO TABS
0.5000 mg | ORAL_TABLET | Freq: Every day | ORAL | Status: DC
  Administered 2021-04-12 – 2021-05-08 (×27): 0.5 mg via ORAL
  Filled 2021-04-12 (×28): qty 1

## 2021-04-12 MED ORDER — THIAMINE HCL 100 MG PO TABS
100.0000 mg | ORAL_TABLET | Freq: Every day | ORAL | Status: DC
  Administered 2021-04-12 – 2021-05-09 (×26): 100 mg via ORAL
  Filled 2021-04-12 (×25): qty 1

## 2021-04-12 MED ORDER — ATORVASTATIN CALCIUM 40 MG PO TABS
40.0000 mg | ORAL_TABLET | Freq: Every day | ORAL | Status: DC
  Administered 2021-04-12 – 2021-05-08 (×26): 40 mg via ORAL
  Filled 2021-04-12 (×27): qty 1

## 2021-04-12 MED ORDER — INSULIN ASPART 100 UNIT/ML IJ SOLN
10.0000 [IU] | Freq: Once | INTRAMUSCULAR | Status: AC
  Administered 2021-04-12: 10 [IU] via SUBCUTANEOUS

## 2021-04-12 MED ORDER — INSULIN GLARGINE-YFGN 100 UNIT/ML ~~LOC~~ SOLN
12.0000 [IU] | Freq: Every day | SUBCUTANEOUS | Status: DC
  Filled 2021-04-12: qty 0.12

## 2021-04-12 NOTE — Consult Note (Addendum)
WOC Nurse Consult Note: ?Patient receiving care in [facility] [phone] ?Reason for Consult: Reddness and pain in the buttock area ?Wound type: MASD/ITD over the coccyx and buttocks area with one small opening at the coccyx tip that appears to be chronic. No signs of irritation or infection in that area. ?Pressure Injury POA: NA ?Measurement: ?Wound bed: Erythematous. Moist and painful ?Dressing procedure/placement/frequency: ?Clean the sacral area with no rinse cleanser, pat dry and apply Gerhardt's Butt Cream in the area four times daily. ? ?ICD-10 CM Codes for Irritant Dermatitis ?V43K0 - Due to fecal, urinary or dual incontinence ?L24B1 - Related to digestive stoma or fistula ?L30.4  - Erythema intertrigo. Also used for abrasion of the hand, chafing of the skin, dermatitis due to sweating and friction, friction dermatitis, friction eczema, and genital/thigh intertrigo.  ? ?Monitor the wound area(s) for worsening of condition such as: ?Signs/symptoms of infection, increase in size, development of or worsening of odor, ?development of pain, or increased pain at the affected locations.   ?Notify the medical team if any of these develop. ? ?[facility] Nurse ostomy consult note ?Stoma type/location: LLQ with moderate prolapse ?Stomal assessment/size: red, moist ?Peristomal assessment: Irritated erythematous  ?Treatment options for stomal/peristomal skin: Crusting and barrier ring  ?Output: none in pouch, mucous around the stoma ?Ostomy pouching: 2pc. 2 1/4" with crusting and barrier ring.  ?Education provided: States daughter changes her pouch at home.  ?Enrolled patient in [place] program: Yes (previously) ? ?Thank you for the consult. [facility] nurse will not follow at this time.   ?Please re-consult the [facility] team if needed. ? ?Cathlean [place]. Tamala Julian, MSN, RN, CMSRN, AGCNS, WTA ?Wound Treatment Associate ?Pager [phone]   ? ? ?  ?

## 2021-04-12 NOTE — Progress Notes (Signed)
Speech Language Pathology Treatment: Dysphagia  ?Patient Details ?Name: Brittany [name] ?MRN: [id number] ?DOB: Jul 04, 1960 ?Today's Date: 04/12/2021 ?Time: 1937-9024 ?SLP Time Calculation (min) (ACUTE ONLY): 14 min ? ?Assessment / Plan / Recommendation ?Clinical Impression ? Pt was seen for dysphagia treatment and she was cooperative throughout the session. She reported that she has been hungry still since breakfast and would like more substantial meals. Pt, and nursing reported that the pt has been tolerating the current diet without overt s/sx of aspiration. Pt tolerated puree solids, regular texture solids, dual consistency boluses and thin liquids via cup and straw without overt s/sx of aspiration. Oral phase was [facility] and no symptoms of pharyngeal residue noted. A regular texture diet with thin liquids is recommended at this time, and SLP will follow to ensure tolerance.  ?  ?HPI HPI: 61 year old female with normal baseline of oriented x3 brought in by EMS for confusion plus DKA.  EEG noted subclinical seizure.  Patient started on Keppra and transferred to [facility] from [facility] for continuous 24-hour EEG.  Since arrival, patient has been grossly confused, trying to rip out lines, Foley catheter.  MRI 2/22 with no acute findings. Pt has past medical history of hypertension, diabetes mellitus, CVA with right-sided weakness, tobacco abuse, COPD and status post pancreatectomy and splenectomy. ?  ?   ?SLP Plan ? Continue with current plan of care ? ?  ?  ?Recommendations for follow up therapy are one component of a multi-disciplinary discharge planning process, led by the attending physician.  Recommendations may be updated based on patient status, additional functional criteria and insurance authorization. ?  ? ?Recommendations  ?Diet recommendations: Regular;Thin liquid ?Liquids provided via: Cup;Straw ?Medication Administration: Whole meds with puree (or with liquid was tolerated) ?Supervision: Staff to  assist with self feeding;Trained caregiver to feed patient;Full supervision/cueing for compensatory strategies ?Compensations: Minimize environmental distractions;Slow rate;Small sips/bites ?Postural Changes and/or Swallow Maneuvers: Seated upright 90 degrees  ?   ?    ?   ? ? ? ? Oral Care Recommendations: Oral care BID ?Follow Up Recommendations: Skilled nursing-short term rehab (<3 hours/day) ?Assistance recommended at discharge: Frequent or constant Supervision/Assistance ?SLP Visit Diagnosis: Dysphagia, oropharyngeal phase (R13.12) ?Plan: Continue with current plan of care ? ? ? ? ?  ?  ?Jacquilyn Seldon I. Hardin Negus, [place], CCC-SLP ?Acute Rehabilitation Services ?Office number [phone] ?Pager [phone] ? ? ?[name] ? ?04/12/2021, 9:11 AM ? ? ? ?

## 2021-04-12 NOTE — Progress Notes (Signed)
Physical Therapy Treatment ?Patient Details ?Name: Brittany [name] ?MRN: [id number] ?DOB: October 31, 1960 ?Today's Date: 04/12/2021 ? ? ?History of Present Illness Brittany [name] is a 27yoF who was brought to the ED for confusion plus DKA.  EEG noted subclinical seizure.  Also found to have incidental COVID. 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 Pt admitted with diverticulitis of intestine with abscess. ? ?  ?PT Comments  ? ? Pt in bed on arrival with sister having just arrived. Pt declining ambulation. Pt's sister brought her a pot pie for lunch. Pt stating she is hungry and just wants to eat. Pt agreeable to OOB and transfer to recliner. She required min guard assist bed mobility, min assist sit to stand, and min assist amb 5' with RW. Pt positioned in recliner with feet elevated and posey chair alarm belt in place. Pt educated on how to remove belt. ?   ?Recommendations for follow up therapy are one component of a multi-disciplinary discharge planning process, led by the attending physician.  Recommendations may be updated based on patient status, additional functional criteria and insurance authorization. ? ?Follow Up Recommendations ? Skilled nursing-short term rehab (<3 hours/day) ?  ?  ?Assistance Recommended at Discharge Frequent or constant Supervision/Assistance  ?Patient can return home with the following A little help with walking and/or transfers;A little help with bathing/dressing/bathroom;Assistance with cooking/housework;Direct supervision/assist for medications management;Help with stairs or ramp for entrance;Assist for transportation ?  ?Equipment Recommendations ? None recommended by PT  ?  ?Recommendations for Other Services   ? ? ?  ?Precautions / Restrictions Precautions ?Precautions: Fall ?Precaution Comments: seizure, COVID+ ?Restrictions ?Weight Bearing Restrictions: No  ?   ? ?Mobility ? Bed Mobility ?Overal bed mobility: Needs Assistance ?Bed Mobility: Supine to Sit ?  ?  ?Supine to sit: Min guard, HOB elevated ?  ?  ?General bed mobility comments: +rail ?  ? ?Transfers ?Overall transfer level: Needs assistance ?Equipment used: Rolling walker (2 wheels) ?Transfers: Sit to/from Stand ?Sit to Stand: Min assist ?  ?  ?  ?  ?  ?General transfer comment: cues for sequencing, assist to power up ?  ? ?Ambulation/Gait ?Ambulation/Gait assistance: Min assist ?Gait Distance (Feet): 5 Feet ?Assistive device: Rolling walker (2 wheels) ?Gait Pattern/deviations: Step-through pattern, Decreased stride length, Narrow base of support ?  ?  ?  ?General Gait Details: Pt declining extended ambulation due to sister having brought her a pot pie to eat for lunch. Agreeable to short amb bed to recliner. ? ? ?Stairs ?  ?  ?  ?  ?  ? ? ?Wheelchair Mobility ?  ? ?Modified Rankin (Stroke Patients Only) ?  ? ? ?  ?Balance Overall balance assessment: Needs assistance ?Sitting-balance support: Feet supported, No upper extremity supported ?Sitting balance-Leahy Scale: Fair ?  ?  ?Standing balance support: During functional activity, Bilateral upper extremity supported, Reliant on assistive device for balance ?Standing balance-Leahy Scale: Poor ?  ?  ?  ?  ?  ?  ?  ?  ?  ?  ?  ?  ?  ? ?  ?Cognition Arousal/Alertness: Awake/alert ?Behavior During Therapy: [facility] for tasks assessed/performed ?Overall Cognitive Status: Impaired/Different from baseline ?Area of Impairment: Orientation, Attention, Memory, Following commands, Safety/judgement, Awareness, Problem solving ?  ?  ?  ?  ?  ?  ?  ?  ?Orientation Level: Disoriented to, Time,  Situation, Place ?Current Attention Level: Sustained ?Memory: Decreased recall of precautions, Decreased short-term memory ?Following Commands: Follows one step commands with increased time, Follows one step commands inconsistently ?Safety/Judgement: Decreased awareness of safety, Decreased  awareness of deficits ?Awareness: Intellectual ?Problem Solving: Slow processing, Decreased initiation, Difficulty sequencing, Requires verbal cues ?  ?  ?  ? ?  ?Exercises   ? ?  ?General Comments General comments (skin integrity, edema, etc.): VSS on RA ?  ?  ? ?Pertinent Vitals/Pain Pain Assessment ?Pain Assessment: No/denies pain  ? ? ?Home Living   ?  ?  ?  ?  ?  ?  ?  ?  ?  ?   ?  ?Prior Function    ?  ?  ?   ? ?PT Goals (current goals can now be found in the care plan section) Acute Rehab PT Goals ?Patient Stated Goal: none stated by patient ?Progress towards PT goals: Progressing toward goals ? ?  ?Frequency ? ? ? Min 3X/week ? ? ? ?  ?PT Plan Current plan remains appropriate  ? ? ?Co-evaluation   ?  ?  ?  ?  ? ?  ?AM-PAC PT "6 Clicks" Mobility   ?Outcome Measure ? Help needed turning from your back to your side while in a flat bed without using bedrails?: A Little ?Help needed moving from lying on your back to sitting on the side of a flat bed without using bedrails?: A Little ?Help needed moving to and from a bed to a chair (including a wheelchair)?: A Little ?Help needed standing up from a chair using your arms (e.g., wheelchair or bedside chair)?: A Little ?Help needed to walk in hospital room?: A Little ?Help needed climbing 3-5 steps with a railing? : A Lot ?6 Click Score: 17 ? ?  ?End of Session Equipment Utilized During Treatment: Gait belt ?Activity Tolerance: Patient tolerated treatment well ?Patient left: in chair;with call bell/phone within reach;with chair alarm set;with family/visitor present ?Nurse Communication: Mobility status ?PT Visit Diagnosis: Unsteadiness on feet (R26.81);Muscle weakness (generalized) (M62.81);Difficulty in walking, not elsewhere classified (R26.2);Ataxic gait (R26.0) ?  ? ? ?Time: 6962-9528 ?PT Time Calculation (min) (ACUTE ONLY): 16 min ? ?Charges:  $Gait Training: 8-22 mins          ?          ? ?Lorrin Goodell, PT  ?Office # [phone] ?Pager [phone] ? ? ? ?Lorriane Shire ?04/12/2021, 12:31 PM ? ?

## 2021-04-12 NOTE — Progress Notes (Signed)
Dr. [place] made aware of 490 CBG.  ?

## 2021-04-12 NOTE — Assessment & Plan Note (Addendum)
Continue ropinirole.

## 2021-04-12 NOTE — Progress Notes (Signed)
Dr. [place] made aware of patient's CBG of 440. ?

## 2021-04-12 NOTE — Progress Notes (Signed)
PROGRESS NOTE    TELLY BROBERG  EXH:[id number] DOB: Nov 08, 1960 DOA: 04/05/2021 PCP: Clinic, [facility]    Brief Narrative:  Brittany [name] is a 61 year old female with past medical history significant for hypertension, diabetes mellitus, CVA with right-sided weakness, COPD, tobacco abuse, and status post pancreatectomy and splenectomy, anxiety/depression, discoid lupus erythematosus, hyperlipidemia who presented to [facility] ED on 2/22 via EMS with confusion.    Per her daughter at baseline, patient is oriented x 3. She has a history of stroke with right-sided weakness. Normally pt can use a cane to walk. This AM, pt was found to be on the floor with confusion. Pt has jerking and slurred speech per her daughter.    Per EDP, EMS reported that the house was extremely filthy. The patient appeared to be in a soiled diaper. Pt has an colostomy with no collection bag in place. Daughter did not know which medications the patient was on.  EMS states they witnessed what could be a partial seizure, and gave the patient a total of 4 mg of intranasal Versed in route to the hospital. When I saw pt in ED. Pt is confused, not orientated x3. She has right-sided weakness which is likely from previous stroke.  No active cough, respiratory distress noted.  No active nausea and vomiting noted.  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 Pt does not seem to have chest pain.  Not sure if patient has symptoms of UTI.  Her daughter is not sure if patient has a history of seizure.  Daughter states that she found a Keppra bottle which is about 62 years old, not sure if patient is taking his medications or not.   In the ED, glucose 460, bicarbonate 18, anion gap 22, WBC 10.9, INR 1.0, troponin level 21, pending beta hydroxybutyric acid, pending urinalysis, positive COVID PCR, potassium 3.4, GFR>60, temperature normal, blood pressure 149/94, heart rate 99, RR 18, oxygen saturation 98% on room  air.  ABG with pH 7.36, CO2 36, O2 52. Patient is admitted to stepdown as inpatient. Patient was noted to have DKA, subclinical seizure on EEG and incidental finding of COVID-19 viral infection.  Neurology was consulted and patient was transferred to [facility] for continuous EEG.  Admitted to the hospital service.      Assessment & Plan:   Assessment and Plan: * New onset seizure ([place]) Suspect underlying etiology is previous CVA due to lowered seizure threshold made worse by DKA/COVID.  Neurology was consulted and followed during initial hospital course.  --Keppra 500 mg p.o. twice daily --Outpatient follow-up with neurology in 2 to 3 months.  DKA (diabetic ketoacidosis) ([place])- (present on admission) Patient presenting with confusion, found to have elevated blood glucose of 460 with an anion gap of 22 on admission.  A1c 15.0 correlating with extremely poor control outpatient.  Etiology likely secondary to medication noncompliance outpatient with her insulin.  She was initially started on insulin drip and transition to subcutaneous insulin.  Diabetic educator following. --Continue diabetic control as below.  Type 2 diabetes mellitus with hyperlipidemia ([place])- (present on admission) Hemoglobin A1c 15.0, poorly controlled.  Suspect noncompliance outpatient. --Diabetic educator following, appreciate assistance --Semglee 12u [place] daily --Novolog 4u TIDAC --SSI for coverage --CBGs qAC/HS --Continue monitor blood sugars closely and continue to adjust  Acute metabolic encephalopathy- (present on admission) Appears to have improved she is alert and following commands and working with physical therapy.  She is amenable to going to  short-term skilled nursing for rehab and then perhaps living with her sister for a while.  Essential hypertension- (present on admission) --Benazepril 10 mg p.o. daily  COPD, mild ([place])- (present on admission) Stable.  Currently on room air --Albuterol MDI  and 1-2 puffs every 4 hours.  Shortness of breath/wheezing  Hypokalemia- (present on admission) Repleted during the hospitalization.  Continue to monitor intermittently.  Restless leg syndrome- (present on admission) --Ropinirole 0.5 mg p.o. qHS  History of stroke --Continue statin --Not on aspirin/Plavix outpatient  Elevated troponin- (present on admission) Etiology likely secondary to type II demand ischemia in the setting of DKA and seizure as above.  EKG with no concerning dynamic changes.  Monitor on telemetry with no concerns for arrhythmia.  Protein-calorie malnutrition, severe- (present on admission) Nutrition Status: Nutrition Problem: Severe Malnutrition Etiology: chronic illness (cancer, lupus) Signs/Symptoms: severe muscle depletion, severe fat depletion, percent weight loss Percent weight loss: 16.5 % (x5 months) Seen by nutrition.  Regular diet with thin liquids.  Continue to encourage increased supplementation.  Hypomagnesemia- (present on admission) Repleted during hospitalization.  Continue to monitor intermittently.  COVID-19 virus infection- (present on admission) Incidental finding with positive PCR on 04/05/2021. --Continue airborne/contact isolation precautions for 10 days while inpatient  Status post colostomy [facility]) -- Wound care consulted for continued colostomy recommendations while inpatient    DVT prophylaxis:   Lovenox   Code Status: Full Code Family Communication: No family present at bedside this morning  Disposition Plan:  Level of care: Med-Surg Status is: Inpatient Remains inpatient appropriate because: Pending SNF placement, likely will need to remain inpatient for 10-day isolation/quarantine     Consultants:  Neurology - signed off 2/27  Procedures:  EEG  Antimicrobials:  None   Subjective: Patient seen examined bedside, resting comfortably.  Pleasantly confused.  No specific questions or concerns this morning.  No family  present at bedside.  Discussed with RN.  Awaiting SNF placement, likely remain inpatient for 10-day quarantine.  Prior to discharge.  Denies headache, no chest pain, shortness of breath, no abdominal pain.  No acute events overnight per nursing staff.  Objective: Vitals:   04/11/21 2211 04/12/21 0115 04/12/21 0356 04/12/21 0830  BP: (!) 151/101 (!) 122/94 (!) 149/98 (!) 133/101  Pulse: 92 (!) 104 89 100  Resp: 20 18 16 18   Temp: 98 F (36.7 C) 99.5 F (37.5 C) 99.2 F (37.3 C) (!) 97.2 F (36.2 C)  TempSrc: Oral Oral Oral Oral  SpO2: 100% 100% 97% 98%  Weight:      Height:        Intake/Output Summary (Last 24 hours) at 04/12/2021 1352 Last data filed at 04/12/2021 0841 Gross per 24 hour  Intake 360 ml  Output --  Net 360 ml   Filed Weights   04/07/21 0500 04/08/21 0413 04/09/21 0500  Weight: 40.1 kg 42.4 kg 43.6 kg    Examination:  Physical Exam: GEN: NAD, alert, pleasantly confused, thin/cachectic in appearance, appears older than stated age HEENT: NCAT, PERRL, EOMI, sclera clear, MMM PULM: CTAB w/o wheezes/crackles, normal respiratory effort, on room air CV: RRR w/o M/G/R GI: abd soft, NTND, NABS, no R/G/M MSK: no peripheral edema, muscle strength globally intact 5/5 bilateral upper/lower extremities NEURO: CN II-XII intact, no focal deficits, sensation to light touch intact PSYCH: normal mood/affect Integumentary: dry/intact, no rashes or wounds    Data Reviewed: I have personally reviewed following labs and imaging studies  CBC: Recent Labs  Lab 04/06/21 0429 04/08/21 0553  WBC  9.4 6.1  HGB 11.2* 11.6*  HCT 34.0* 34.8*  MCV 90.9 89.2  PLT 408* 502*   Basic Metabolic Panel: Recent Labs  Lab 04/06/21 1556 04/07/21 1330 04/07/21 1844 04/08/21 0553 04/08/21 1624 04/09/21 0159 04/10/21 0229 04/12/21 0041 04/12/21 0626  NA 137  --   --  135 135 135 137  --   --   K 2.9*  --   --  2.1* 3.7 3.8 4.2  --   --   CL 99  --   --  96* 98 100 101  --   --    CO2 26  --   --  30 27 25 29   --   --   GLUCOSE 98  --   --  302* 175* 116* 167* 346*  --   BUN <5*  --   --  <5* <5* 5* 8  --   --   CREATININE 0.46  --   --  0.52 0.56 0.45 0.57  --  0.71  CALCIUM 7.8*  --   --  7.5* 7.9* 8.0* 8.6*  --   --   MG  --  1.3* 1.3* 1.3* 2.2  --   --   --   --   PHOS  --  2.2* 2.5 3.5 3.4  --   --   --   --    GFR: Estimated Creatinine Clearance: 50.8 mL/min (by C-G formula based on SCr of 0.71 mg/dL). Liver Function Tests: Recent Labs  Lab 04/08/21 0553 04/10/21 0229  AST 57* 28  ALT 36 29  ALKPHOS 112 125  BILITOT 0.3 0.3  PROT 5.7* 6.0*  ALBUMIN 2.0* 2.1*   No results for input(s): LIPASE, AMYLASE in the last 168 hours. Recent Labs  Lab 04/07/21 1816  AMMONIA 39*   Coagulation Profile: No results for input(s): INR, PROTIME in the last 168 hours. Cardiac Enzymes: No results for input(s): CKTOTAL, CKMB, CKMBINDEX, TROPONINI in the last 168 hours. BNP (last 3 results) No results for input(s): PROBNP in the last 8760 hours. HbA1C: No results for input(s): HGBA1C in the last 72 hours. CBG: Recent Labs  Lab 04/11/21 1307 04/11/21 1746 04/11/21 2126 04/12/21 0611 04/12/21 1224  GLUCAP 348* 266* 460* 315* 440*   Lipid Profile: No results for input(s): CHOL, HDL, LDLCALC, TRIG, CHOLHDL, LDLDIRECT in the last 72 hours. Thyroid Function Tests: No results for input(s): TSH, T4TOTAL, FREET4, T3FREE, THYROIDAB in the last 72 hours. Anemia Panel: No results for input(s): VITAMINB12, FOLATE, FERRITIN, TIBC, IRON, RETICCTPCT in the last 72 hours. Sepsis Labs: No results for input(s): PROCALCITON, LATICACIDVEN in the last 168 hours.  Recent Results (from the past 240 hour(s))  CULTURE, BLOOD (ROUTINE X 2) w Reflex to ID Panel     Status: None   Collection Time: 04/05/21  8:24 AM   Specimen: BLOOD  Result Value Ref Range Status   Specimen Description BLOOD RIGHT ANTECUBITAL  Final   Special Requests BOTTLES DRAWN AEROBIC ONLY BCAV  Final    Culture   Final    NO GROWTH 5 DAYS Performed at [facility], [address] [place], [place] [address]    Report Status 04/10/2021 FINAL  Final  Resp Panel by RT-PCR (Flu A&B, Covid) Nasopharyngeal Swab     Status: Abnormal   Collection Time: 04/05/21  8:25 AM   Specimen: Nasopharyngeal Swab; Nasopharyngeal(NP) swabs in vial transport medium  Result Value Ref Range Status   SARS Coronavirus 2 by RT PCR  POSITIVE (A) NEGATIVE Final    Comment: (NOTE) SARS-CoV-2 target nucleic acids are DETECTED.  The SARS-CoV-2 RNA is generally detectable in upper respiratory specimens during the acute phase of infection. Positive results are indicative of the presence of the identified virus, but do not rule out bacterial infection or co-infection with other pathogens not detected by the test. Clinical correlation with patient history and other diagnostic information is necessary to determine patient infection status. The expected result is Negative.  Fact Sheet for Patients: EntrepreneurPulse.com.au  Fact Sheet for Healthcare Providers: IncredibleEmployment.be  This test is not yet approved or cleared by the [place] FDA and  has been authorized for detection and/or diagnosis of SARS-CoV-2 by FDA under an Emergency Use Authorization (EUA).  This EUA will remain in effect (meaning this test can be used) for the duration of  the COVID-19 declaration under Section 564(b)(1) of the A ct, 21 U.S.C. section 360bbb-3(b)(1), unless the authorization is terminated or revoked sooner.     Influenza A by PCR NEGATIVE NEGATIVE Final   Influenza B by PCR NEGATIVE NEGATIVE Final    Comment: (NOTE) The Xpert Xpress SARS-CoV-2/FLU/RSV plus assay is intended as an aid in the diagnosis of influenza from Nasopharyngeal swab specimens and should not be used as a sole basis for treatment. Nasal washings and aspirates are unacceptable for Xpert Xpress  SARS-CoV-2/FLU/RSV testing.  Fact Sheet for Patients: EntrepreneurPulse.com.au  Fact Sheet for Healthcare Providers: IncredibleEmployment.be  This test is not yet approved or cleared by the [place] FDA and has been authorized for detection and/or diagnosis of SARS-CoV-2 by FDA under an Emergency Use Authorization (EUA). This EUA will remain in effect (meaning this test can be used) for the duration of the COVID-19 declaration under Section 564(b)(1) of the Act, 21 U.S.C. section 360bbb-3(b)(1), unless the authorization is terminated or revoked.  Performed at [facility], [address] [place], [place] [address]          Radiology Studies: No results found.      Scheduled Meds:  atorvastatin  40 mg Oral QHS   benazepril  10 mg Oral Daily   enoxaparin (LOVENOX) injection  20 mg Subcutaneous Q24H   feeding supplement (GLUCERNA SHAKE)  237 mL Oral TID BM   Gerhardt's butt cream   Topical QID   insulin aspart  0-5 Units Subcutaneous QHS   insulin aspart  0-9 Units Subcutaneous TID WC   insulin aspart  4 Units Subcutaneous TID WC   insulin glargine-yfgn  12 Units Subcutaneous QHS   levETIRAcetam  500 mg Oral BID   lipase/protease/amylase  72,000 Units Oral TID with meals   multivitamin with minerals  1 tablet Oral Daily   rOPINIRole  0.5 mg Oral QHS   thiamine  100 mg Oral Daily   Continuous Infusions:   LOS: 7 days    Time spent: 43 minutes spent on chart review, discussion with nursing staff, consultants, updating family and interview/physical exam; more than 50% of that time was spent in counseling and/or coordination of care.    Storey Stangeland J [place] DO [facility] Available via Epic secure chat 7am-7pm After these hours, please refer to coverage provider listed on [email or web address] 04/12/2021, 1:52 PM

## 2021-04-12 NOTE — Assessment & Plan Note (Addendum)
Continue statin. ?Not on aspirin/Plavix outpatient ?

## 2021-04-12 NOTE — Assessment & Plan Note (Addendum)
Etiology likely secondary to type II demand ischemia in the setting of DKA and seizure as above.  EKG with no concerning dynamic changes.  No concerning arrhythmias noted on telemetry, now discontinued.

## 2021-04-12 NOTE — Assessment & Plan Note (Addendum)
Wound care consulted for continued colostomy recommendations while inpatient. ?

## 2021-04-12 NOTE — Progress Notes (Addendum)
Inpatient Diabetes Program Recommendations ? ?AACE/ADA: New Consensus Statement on Inpatient Glycemic Control (2015) ? ?Target Ranges:  Prepandial:   less than 140 mg/dL ?     Peak postprandial:   less than 180 mg/dL (1-2 hours) ?     Critically ill patients:  140 - 180 mg/dL  ? ? Latest Reference Range & Units 04/11/21 06:10 04/11/21 06:43 04/11/21 13:07 04/11/21 17:46 04/11/21 21:26  ?Glucose-Capillary 70 - 99 mg/dL 61 (L) 129 (H) 348 (H) ? ?7 units Novolog ? 266 (H) ? ?5 units Novolog ? 460 (H) ? ?7 units Novolog ?'@2307'  ? ?8 units Semglee '@2200'   ? ? Latest Reference Range & Units 04/12/21 06:11  ?Glucose-Capillary 70 - 99 mg/dL 315 (H) ? ?7 units Novolog ?  ? ? ? ?Home DM Meds:  70/30 Insulin 30 units QAM with breakfast ? ? ?Current Orders: Semglee 12 units QHS ?    Novolog Sensitive Correction Scale/ SSI (0-9 units) TID AC + HS ? ? ? ?Note Semglee dose increased for tonight ? ? ? ?MD- Note patient getting Glucerna PO Supplements TID between meals in addition to Dysphagic diet ? ?Afternoon CBGs remain elevated ? ?Please consider starting Novolog Meal Coverage: ? ?Novolog 4 units TID with meals ? ? ? ?Addendum 12pm--Met w/ pt this AM.  Sister at bedside.  Sister asking me about Rehab plans--asked sister to address these questions with either PT and or the MD as I am unsure at this point where pt will be placed for Rehab.  Sister under the impression that pt may be able to go to CIR here at [facility]?  Sister and pt both agreed that pt unable to take care of herself properly or safely at this point.  Pt stated she is supposed to be taking 70/30 Insulin BID at home but also confessed that she does not take on a regular basis.  Reviewed current A1c of 15% and stressed to pt and her sister the importance of better CBG control for overall health.  During our conversation, pt drifted off into space and had to be re-directed several times.  I am unsure of pt will be capable to care for herself independently after rehab??   Will likely need to live with someone after discharge who can help her with meds, MD visits, etc. ? ? ? ?--Will follow patient during hospitalization-- ? ?Wyn Quaker RN, MSN, CDE ?Diabetes Coordinator ?Inpatient Glycemic Control Team ?Team Pager: [phone] (8a-5p) ? ? ? ? ?

## 2021-04-13 DIAGNOSIS — L8990 Pressure ulcer of unspecified site, unspecified stage: Secondary | ICD-10-CM | POA: Insufficient documentation

## 2021-04-13 LAB — GLUCOSE, CAPILLARY
Glucose-Capillary: 410 mg/dL — ABNORMAL HIGH (ref 70–99)
Glucose-Capillary: 179 mg/dL — ABNORMAL HIGH (ref 70–99)
Glucose-Capillary: 156 mg/dL — ABNORMAL HIGH (ref 70–99)
Glucose-Capillary: 203 mg/dL — ABNORMAL HIGH (ref 70–99)

## 2021-04-13 MED ORDER — MELATONIN 3 MG PO TABS
3.0000 mg | ORAL_TABLET | Freq: Every day | ORAL | Status: DC
  Administered 2021-04-13 – 2021-04-15 (×3): 3 mg via ORAL
  Filled 2021-04-13 (×3): qty 1

## 2021-04-13 MED ORDER — INSULIN GLARGINE-YFGN 100 UNIT/ML ~~LOC~~ SOLN
25.0000 [IU] | Freq: Every day | SUBCUTANEOUS | Status: DC
  Administered 2021-04-13: 25 [IU] via SUBCUTANEOUS
  Filled 2021-04-13 (×2): qty 0.25

## 2021-04-13 MED ORDER — INSULIN GLARGINE-YFGN 100 UNIT/ML ~~LOC~~ SOLN
10.0000 [IU] | Freq: Once | SUBCUTANEOUS | Status: AC
  Administered 2021-04-13: 10 [IU] via SUBCUTANEOUS
  Filled 2021-04-13: qty 0.1

## 2021-04-13 MED ORDER — INSULIN ASPART 100 UNIT/ML IJ SOLN
8.0000 [IU] | Freq: Three times a day (TID) | INTRAMUSCULAR | Status: DC
  Administered 2021-04-13 (×3): 8 [IU] via SUBCUTANEOUS

## 2021-04-13 NOTE — Progress Notes (Signed)
PROGRESS NOTE    Brittany [name]  XBM:[id number] DOB: 24-Jul-1960 DOA: 04/05/2021 PCP: Clinic, [facility]    Brief Narrative:  Brittany [name] is a 61 year old female with past medical history significant for hypertension, diabetes mellitus, CVA with right-sided weakness, COPD, tobacco abuse, and status post pancreatectomy and splenectomy, anxiety/depression, discoid lupus erythematosus, hyperlipidemia who presented to [facility] ED on 2/22 via EMS with confusion.    Per her daughter at baseline, patient is oriented x 3. She has a history of stroke with right-sided weakness. Normally pt can use a cane to walk. This AM, pt was found to be on the floor with confusion. Pt has jerking and slurred speech per her daughter.    Per EDP, EMS reported that the house was extremely filthy. The patient appeared to be in a soiled diaper. Pt has an colostomy with no collection bag in place. Daughter did not know which medications the patient was on.  EMS states they witnessed what could be a partial seizure, and gave the patient a total of 4 mg of intranasal Versed in route to the hospital. When I saw pt in ED. Pt is confused, not orientated x3. She has right-sided weakness which is likely from previous stroke.  No active cough, respiratory distress noted.  No active nausea and vomiting noted.  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 Pt does not seem to have chest pain.  Not sure if patient has symptoms of UTI.  Her daughter is not sure if patient has a history of seizure.  Daughter states that she found a Keppra bottle which is about 62 years old, not sure if patient is taking his medications or not.   In the ED, glucose 460, bicarbonate 18, anion gap 22, WBC 10.9, INR 1.0, troponin level 21, pending beta hydroxybutyric acid, pending urinalysis, positive COVID PCR, potassium 3.4, GFR>60, temperature normal, blood pressure 149/94, heart rate 99, RR 18, oxygen saturation 98% on room  air.  ABG with pH 7.36, CO2 36, O2 52. Patient is admitted to stepdown as inpatient. Patient was noted to have DKA, subclinical seizure on EEG and incidental finding of COVID-19 viral infection.  Neurology was consulted and patient was transferred to [facility] for continuous EEG.  Admitted to the hospital service.      Assessment & Plan:   Assessment and Plan: * New onset seizure ([place]) Suspect underlying etiology is previous CVA due to lowered seizure threshold made worse by DKA/COVID.  Neurology was consulted and followed during initial hospital course.  --Keppra 500 mg p.o. twice daily --Outpatient follow-up with neurology in 2 to 3 months.  Acute metabolic encephalopathy Improved since admission, but seems to be waxing and waning.  Likely complicated by hospital-acquired delirium and fluctuations in glucose due to noncompliance with diet.  Working with physical and Occupational Therapy.  Will need to discharge to skilled nursing for rehab and then may need to live with her sister as she is likely unable to manage her ADLs at home alone.  DKA (diabetic ketoacidosis) ([place]) Patient presenting with confusion, found to have elevated blood glucose of 460 with an anion gap of 22 on admission.  A1c 15.0 correlating with extremely poor control outpatient.  Etiology likely secondary to medication noncompliance outpatient with her insulin.  She was initially started on insulin drip and transition to subcutaneous insulin.  Diabetic educator following. --Continue diabetic control as below.  Type 2 diabetes mellitus with hyperlipidemia (HCC) Hemoglobin A1c 15.0,  poorly controlled.  Suspect noncompliance outpatient and dietary discretions while inpatient. --Diabetic educator following, appreciate assistance --Semglee 25u  daily --Novolog 8u TIDAC --SSI for coverage --CBGs qAC/HS --Continue monitor blood sugars closely and continue to adjust  Essential hypertension --Benazepril 10 mg  p.o. daily  Hypokalemia Repleted during the hospitalization.  Continue to monitor intermittently.  Restless leg syndrome --Ropinirole 0.5 mg p.o. qHS  Elevated troponin Etiology likely secondary to type II demand ischemia in the setting of DKA and seizure as above.  EKG with no concerning dynamic changes.  No concerning arrhythmias noted on telemetry, now discontinued.    History of stroke --Continue statin --Not on aspirin/Plavix outpatient  Hypomagnesemia Repleted during hospitalization.  Continue to monitor intermittently.  COVID-19 virus infection Incidental finding with positive PCR on 04/05/2021. --Continue airborne/contact isolation precautions for 10 days while inpatient; likely can discontinue 3/5.  Status post colostomy [facility]) --Wound care consulted for continued colostomy recommendations while inpatient  COPD, mild ([place]) Stable.  Currently on room air --Albuterol MDI and 1-2 puffs every 4 hours.  Shortness of breath/wheezing  Protein-calorie malnutrition, severe Nutrition Status: Nutrition Problem: Severe Malnutrition Etiology: chronic illness (cancer, lupus) Signs/Symptoms: severe muscle depletion, severe fat depletion, percent weight loss Percent weight loss: 16.5 % (x5 months) Seen by nutrition.  Regular diet with thin liquids.  Continue to encourage increased supplementation.    DVT prophylaxis:   Lovenox   Code Status: Full Code Family Communication: No family present at bedside this morning  Disposition Plan:  Level of care: Med-Surg Status is: Inpatient Remains inpatient appropriate because: Pending SNF placement, likely will need to remain inpatient for 10-day isolation/quarantine     Consultants:  Neurology - signed off 2/27  Procedures:  EEG  Antimicrobials:  None   Subjective: Patient seen examined bedside, resting comfortably.  Pleasantly confused.  Patient placed in lapbelt restraints overnight for attempts to get out of bed.   Notified by RN yesterday that patient's family has been bringing in [facility] and cakes for her to eat, likely contributing to her poorly controlled glucose while inpatient.  Remains on airborne/contact isolation for incidental COVID-19 viral infection.  No specific questions or concerns this morning.  No family present at bedside.  Discussed with RN.  Awaiting SNF placement, likely remain inpatient for 10-day quarantine. Denies headache, no chest pain, shortness of breath, no abdominal pain.  No acute events overnight per nursing staff.  Objective: Vitals:   04/13/21 0846 04/13/21 1054 04/13/21 1110 04/13/21 1122  BP: 113/81 100/78 100/76 103/79  Pulse: (!) 111 (!) 110 98 98  Resp: 18  18   Temp: 98 F (36.7 C) (!) 97.5 F (36.4 C)    TempSrc: Oral Oral    SpO2: 100% 100%  100%  Weight:      Height:        Intake/Output Summary (Last 24 hours) at 04/13/2021 1237 Last data filed at 04/12/2021 1811 Gross per 24 hour  Intake 600 ml  Output 500 ml  Net 100 ml   Filed Weights   04/08/21 0413 04/09/21 0500 04/13/21 0436  Weight: 42.4 kg 43.6 kg 43 kg    Examination:  Physical Exam: GEN: NAD, alert, pleasantly confused, thin/cachectic in appearance, appears older than stated age HEENT: NCAT, PERRL, EOMI, sclera clear, MMM PULM: CTAB w/o wheezes/crackles, normal respiratory effort, on room air CV: RRR w/o M/G/R GI: abd soft, NTND, NABS, no R/G/M MSK: no peripheral edema, muscle strength globally intact 5/5 bilateral upper/lower extremities NEURO: CN II-XII intact, no  focal deficits, sensation to light touch intact PSYCH: normal mood/affect Integumentary: dry/intact, no rashes or wounds    Data Reviewed: I have personally reviewed following labs and imaging studies  CBC: Recent Labs  Lab 04/08/21 0553  WBC 6.1  HGB 11.6*  HCT 34.8*  MCV 89.2  PLT 062*   Basic Metabolic Panel: Recent Labs  Lab 04/06/21 1556 04/07/21 1330 04/07/21 1844 04/08/21 0553 04/08/21 1624  04/09/21 0159 04/10/21 0229 04/12/21 0041 04/12/21 0626  NA 137  --   --  135 135 135 137  --   --   K 2.9*  --   --  2.1* 3.7 3.8 4.2  --   --   CL 99  --   --  96* 98 100 101  --   --   CO2 26  --   --  30 27 25 29   --   --   GLUCOSE 98  --   --  302* 175* 116* 167* 346*  --   BUN <5*  --   --  <5* <5* 5* 8  --   --   CREATININE 0.46  --   --  0.52 0.56 0.45 0.57  --  0.71  CALCIUM 7.8*  --   --  7.5* 7.9* 8.0* 8.6*  --   --   MG  --  1.3* 1.3* 1.3* 2.2  --   --   --   --   PHOS  --  2.2* 2.5 3.5 3.4  --   --   --   --    GFR: Estimated Creatinine Clearance: 50.1 mL/min (by C-G formula based on SCr of 0.71 mg/dL). Liver Function Tests: Recent Labs  Lab 04/08/21 0553 04/10/21 0229  AST 57* 28  ALT 36 29  ALKPHOS 112 125  BILITOT 0.3 0.3  PROT 5.7* 6.0*  ALBUMIN 2.0* 2.1*   No results for input(s): LIPASE, AMYLASE in the last 168 hours. Recent Labs  Lab 04/07/21 1816  AMMONIA 39*   Coagulation Profile: No results for input(s): INR, PROTIME in the last 168 hours. Cardiac Enzymes: No results for input(s): CKTOTAL, CKMB, CKMBINDEX, TROPONINI in the last 168 hours. BNP (last 3 results) No results for input(s): PROBNP in the last 8760 hours. HbA1C: No results for input(s): HGBA1C in the last 72 hours. CBG: Recent Labs  Lab 04/12/21 0611 04/12/21 1224 04/12/21 1559 04/12/21 2044 04/13/21 0622  GLUCAP 315* 440* 490* 300* 410*   Lipid Profile: No results for input(s): CHOL, HDL, LDLCALC, TRIG, CHOLHDL, LDLDIRECT in the last 72 hours. Thyroid Function Tests: No results for input(s): TSH, T4TOTAL, FREET4, T3FREE, THYROIDAB in the last 72 hours. Anemia Panel: No results for input(s): VITAMINB12, FOLATE, FERRITIN, TIBC, IRON, RETICCTPCT in the last 72 hours. Sepsis Labs: No results for input(s): PROCALCITON, LATICACIDVEN in the last 168 hours.  Recent Results (from the past 240 hour(s))  CULTURE, BLOOD (ROUTINE X 2) w Reflex to ID Panel     Status: None    Collection Time: 04/05/21  8:24 AM   Specimen: BLOOD  Result Value Ref Range Status   Specimen Description BLOOD RIGHT ANTECUBITAL  Final   Special Requests BOTTLES DRAWN AEROBIC ONLY BCAV  Final   Culture   Final    NO GROWTH 5 DAYS Performed at [facility], [address] [place], [place] [address]    Report Status 04/10/2021 FINAL  Final  Resp Panel by RT-PCR (Flu A&B, Covid) Nasopharyngeal Swab  Status: Abnormal   Collection Time: 04/05/21  8:25 AM   Specimen: Nasopharyngeal Swab; Nasopharyngeal(NP) swabs in vial transport medium  Result Value Ref Range Status   SARS Coronavirus 2 by RT PCR POSITIVE (A) NEGATIVE Final    Comment: (NOTE) SARS-CoV-2 target nucleic acids are DETECTED.  The SARS-CoV-2 RNA is generally detectable in upper respiratory specimens during the acute phase of infection. Positive results are indicative of the presence of the identified virus, but do not rule out bacterial infection or co-infection with other pathogens not detected by the test. Clinical correlation with patient history and other diagnostic information is necessary to determine patient infection status. The expected result is Negative.  Fact Sheet for Patients: EntrepreneurPulse.com.au  Fact Sheet for Healthcare Providers: IncredibleEmployment.be  This test is not yet approved or cleared by the [place] FDA and  has been authorized for detection and/or diagnosis of SARS-CoV-2 by FDA under an Emergency Use Authorization (EUA).  This EUA will remain in effect (meaning this test can be used) for the duration of  the COVID-19 declaration under Section 564(b)(1) of the A ct, 21 U.S.C. section 360bbb-3(b)(1), unless the authorization is terminated or revoked sooner.     Influenza A by PCR NEGATIVE NEGATIVE Final   Influenza B by PCR NEGATIVE NEGATIVE Final    Comment: (NOTE) The Xpert Xpress SARS-CoV-2/FLU/RSV plus assay is intended as  an aid in the diagnosis of influenza from Nasopharyngeal swab specimens and should not be used as a sole basis for treatment. Nasal washings and aspirates are unacceptable for Xpert Xpress SARS-CoV-2/FLU/RSV testing.  Fact Sheet for Patients: EntrepreneurPulse.com.au  Fact Sheet for Healthcare Providers: IncredibleEmployment.be  This test is not yet approved or cleared by the [place] FDA and has been authorized for detection and/or diagnosis of SARS-CoV-2 by FDA under an Emergency Use Authorization (EUA). This EUA will remain in effect (meaning this test can be used) for the duration of the COVID-19 declaration under Section 564(b)(1) of the Act, 21 U.S.C. section 360bbb-3(b)(1), unless the authorization is terminated or revoked.  Performed at [facility], [address], [place], [place] [address]          Radiology Studies: No results found.      Scheduled Meds:  atorvastatin  40 mg Oral QHS   benazepril  10 mg Oral Daily   enoxaparin (LOVENOX) injection  20 mg Subcutaneous Q24H   feeding supplement (GLUCERNA SHAKE)  237 mL Oral TID BM   Gerhardt's butt cream   Topical QID   insulin aspart  0-5 Units Subcutaneous QHS   insulin aspart  0-9 Units Subcutaneous TID WC   insulin aspart  8 Units Subcutaneous TID WC   insulin glargine-yfgn  25 Units Subcutaneous QHS   levETIRAcetam  500 mg Oral BID   lipase/protease/amylase  72,000 Units Oral TID with meals   melatonin  3 mg Oral QHS   multivitamin with minerals  1 tablet Oral Daily   rOPINIRole  0.5 mg Oral QHS   thiamine  100 mg Oral Daily   Continuous Infusions:   LOS: 8 days    Time spent: 42 minutes spent on chart review, discussion with nursing staff, consultants, updating family and interview/physical exam; more than 50% of that time was spent in counseling and/or coordination of care.    Taysom Glymph J [place] DO [facility] Available via Epic  secure chat 7am-7pm After these hours, please refer to coverage provider listed on [email or web address] 04/13/2021, 12:37 PM

## 2021-04-13 NOTE — Progress Notes (Signed)
Occupational Therapy Treatment ?Patient Details ?Name: Brittany [name] ?MRN: [id number] ?DOB: [birth year] ?Today's Date: 04/13/2021 ? ? ?History of present illness Brittany [name] is a 23yoF who was brought to the ED for confusion plus DKA.  EEG noted subclinical seizure.  Also found to have incidental COVID. 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 Pt admitted with diverticulitis of intestine with abscess. ?  ?OT comments ? Treatment focused on self care tasks. She is overall min assist for ADLs needing verbal cues for problem solving and safety. She is min guard to min assist with ambulation and walker - with one overt loss of balance. She is only alert to self and a hospital but able to follow simple commands. She was able to don Depends with min assist, perform grooming in bed and at sink with set up, verbal cues to improve quality at times. She brushed her teeth and hands at the sink with very good quality with increased time. She was able to ambulate to the bathroom and perform toileting needing assistance for walker in tight space. She moves somewhat slow but is not overtly unsafe with ambulation. Unsure of her baseline cognition as she seems to be unaware of deficits. She did not exhibit any specific inattention to right side today during functional task and vision appears to be grossly normal as well - as she was able to navigate in room and locate items at the sink. She did have difficulty reading an analog clock. Continue to recommend short term rehab and 24/7 supervision.   ? ?Recommendations for follow up therapy are one component of a multi-disciplinary discharge planning process, led by the attending physician.  Recommendations may be updated based on patient status, additional functional criteria and insurance authorization. ?   ?Follow Up Recommendations ? Skilled nursing-short  term rehab (<3 hours/day)  ?  ?Assistance Recommended at Discharge Frequent or constant Supervision/Assistance  ?Patient can return home with the following ? A little help with walking and/or transfers;A little help with bathing/dressing/bathroom;Assistance with cooking/housework;Assistance with feeding;Direct supervision/assist for medications management;Direct supervision/assist for financial management;Assist for transportation;Help with stairs or ramp for entrance ?  ?Equipment Recommendations ? None recommended by OT  ?  ?Recommendations for Other Services   ? ?  ?Precautions / Restrictions Precautions ?Precautions: Fall ?Precaution Comments: seizure, COVID+ ?Restrictions ?Weight Bearing Restrictions: No  ? ? ?  ? ?Mobility Bed Mobility ?  ?  ?  ?  ?  ?  ?  ?  ?  ? ?Transfers ?  ?  ?  ?  ?  ?  ?  ?  ?  ?  ?  ?  ?Balance Overall balance assessment: Needs assistance ?Sitting-balance support: No upper extremity supported, Feet supported ?Sitting balance-Leahy Scale: Good ?  ?  ?Standing balance support: Single extremity supported ?  ?Standing balance comment: needs at least one UE to maintain balance ?  ?  ?  ?  ?  ?  ?  ?  ?  ?  ?  ?   ? ?ADL either performed or assessed with clinical judgement  ? ?ADL Overall ADL's : Needs assistance/impaired ?  ?  ?Grooming: Set up;Standing;Cueing for sequencing;Min guard ?Grooming Details (indicate cue type and reason): Patient able to open toothpaste but struggled figuring out to get toothbrush out of wrapper/container eventually needing assistance. Able to stand at sink with body weight  resting on counter to brush teeth. No overt loss of balance. Able to turn water on and off. able to locate soap and paper towels. ?  ?  ?  ?  ?  ?  ?Lower Body Dressing: Minimal assistance ?Lower Body Dressing Details (indicate cue type and reason): Min assist to get Depends over feet correctly (first time she got both legs through one leg hole) and patient able to pull up ?Toilet Transfer:  Min guard;Rolling walker (2 wheels);Regular Toilet ?Toilet Transfer Details (indicate cue type and reason): verbal cues for hand placement but no physical assistance. ?Toileting- Water quality scientist and Hygiene: Min guard;Sit to/from stand ?  ?  ?  ?Functional mobility during ADLs: Minimal assistance;Rolling walker (2 wheels) ?General ADL Comments: Overall min guard for ambulation with one gross LOB that therapist corrected. needed min assist to manage walker in small bathroom. ?  ? ?Extremity/Trunk Assessment Upper Extremity Assessment ?Upper Extremity Assessment: LUE deficits/detail ?RUE Deficits / Details: WFL ROM (less shoulder ROM than left), 4-/5 shoulder strength, elbow strength 4/5, wrist 5/5, grip 4/5 ?RUE Sensation:  (reports pins and needles in her fingers today) ?RUE Coordination:  (grossly functional coordination today) ?LUE Deficits / Details: WFL ROM, 5/5 strength ?LUE Sensation:  (reports pins and needles) ?LUE Coordination: WNL ?  ?  ?  ?  ?  ? ?Vision   ?Vision Assessment?: No apparent visual deficits ?Additional Comments: Able to locate items during ADLs and negotiate around bathroom door today. Able to locate fingers and number correctly in all four quadrants, able to track with multple verbal cues ?  ?Perception   ?  ?Praxis   ?  ? ?Cognition Arousal/Alertness: Awake/alert ?Behavior During Therapy: [facility] for tasks assessed/performed ?Overall Cognitive Status: No family/caregiver present to determine baseline cognitive functioning ?  ?  ?  ?  ?  ?  ?  ?  ?  ?  ?  ?  ?  ?  ?  ?  ?General Comments: Patient able to follow simple commands. Alert to self and a [facility] - otherwise reports year, month and President incorrectly. Unsure of patient's baseline. Needs verbal cues for problem solving during ADLs. ?  ?  ?   ?Exercises   ? ?  ?Shoulder Instructions   ? ? ?  ?General Comments    ? ? ?Pertinent Vitals/ Pain       Pain Assessment ?Pain Assessment: No/denies pain ? ?Home Living   ?  ?   ?  ?  ?  ?  ?  ?  ?  ?  ?  ?  ?  ?  ?  ?  ?  ?  ? ?  ?Prior Functioning/Environment    ?  ?  ?  ?   ? ?Frequency ? Min 2X/week  ? ? ? ? ?  ?Progress Toward Goals ? ?OT Goals(current goals can now be found in the care plan section) ? Progress towards OT goals: Progressing toward goals ? ?Acute Rehab OT Goals ?OT Goal Formulation: Patient unable to participate in goal setting ?Time For Goal Achievement: 04/25/21 ?Potential to Achieve Goals: Good  ?Plan Discharge plan remains appropriate   ? ?Co-evaluation ? ? ?   ?  ?  ?  ?  ? ?  ?AM-PAC OT "6 Clicks" Daily Activity     ?Outcome Measure ? ? Help from another person eating meals?: A Little ?Help from another person taking care of personal grooming?: A Little ?Help from another person toileting, which  includes using toliet, bedpan, or urinal?: A Little ?Help from another person bathing (including washing, rinsing, drying)?: A Little ?Help from another person to put on and taking off regular upper body clothing?: A Little ?Help from another person to put on and taking off regular lower body clothing?: A Little ?6 Click Score: 18 ? ?  ?End of Session Equipment Utilized During Treatment: Rolling walker (2 wheels);Gait belt ? ?OT Visit Diagnosis: Unsteadiness on feet (R26.81);Other abnormalities of gait and mobility (R26.89);Muscle weakness (generalized) (M62.81) ?  ?Activity Tolerance Patient tolerated treatment well ?  ?Patient Left in bed;with call bell/phone within reach;with bed alarm set;with nursing/sitter in room (waist strap) ?  ?Nurse Communication   ?  ? ?   ? ?Time: 0940-7680 ?OT Time Calculation (min): 42 min ? ?Charges: OT General Charges ?$OT Visit: 1 Visit ?OT Treatments ?$Self Care/Home Management : 38-52 mins ? ?Margaret Cockerill, OTR/L ?Acute Care Rehab Services  ?Office [phone] ?Pager: [phone]  ? ?Azreal Stthomas L Verneice Caspers ?04/13/2021, 11:19 AM ?

## 2021-04-13 NOTE — Progress Notes (Signed)
Speech Language Pathology Treatment: Dysphagia  ?Patient Details ?Name: Brittany [name] ?MRN: [id number] ?DOB: 1960/02/14 ?Today's Date: 04/13/2021 ?Time: 2992-4268 ?SLP Time Calculation (min) (ACUTE ONLY): 20 min ? ?Assessment / Plan / Recommendation ?Clinical Impression ? Pt was seen for dysphagia treatment. Pt's RN, Freddrick March, denied knowledge of pt's difficulty swallowing since the diet had been advanced. Upon SLP's arrival, pt was seated on the side of the bed with belt restraint tightly drawn against her abdomen (likely from her movement), diaper on floor, and feces were noted throughout bed. Belt restraint was loosened by this SLP, RN was contacted RN for assistance and SLP waited with pt until RN's arrival. SLP had brought in meal tray and pt still requested some p.o. intake prior to her being cleaned. Pt was fed limited boluses by SLP and she tolerated bacon and consecutive swallows of thin liquids without over s/sx of aspiration. Mastication was prolonged, but the impact of her being distracted by the situation is considered. Oral clearance was functional. Pt's current diet will be continued, and considering pt's limited p.o. intake during this session, SLP will continue to follow pt.  ?  ?HPI HPI: 61 year old female with normal baseline of oriented x3 brought in by EMS for confusion plus DKA.  EEG noted subclinical seizure.  Patient started on Keppra and transferred to [facility] from [facility] regional for continuous 24-hour EEG.  Since arrival, patient has been grossly confused, trying to rip out lines, Foley catheter.  MRI 2/22 with no acute findings. Pt has past medical history of hypertension, diabetes mellitus, CVA with right-sided weakness, tobacco abuse, COPD and status post pancreatectomy and splenectomy. ?  ?   ?SLP Plan ? Continue with current plan of care ? ?  ?  ?Recommendations for follow up therapy are one component of a multi-disciplinary discharge planning process, led by the attending physician.   Recommendations may be updated based on patient status, additional functional criteria and insurance authorization. ?  ? ?Recommendations  ?Diet recommendations: Regular;Thin liquid ?Liquids provided via: Cup;Straw ?Medication Administration: Whole meds with puree (or with liquid was tolerated) ?Supervision: Staff to assist with self feeding;Trained caregiver to feed patient;Full supervision/cueing for compensatory strategies ?Compensations: Minimize environmental distractions;Slow rate;Small sips/bites ?Postural Changes and/or Swallow Maneuvers: Seated upright 90 degrees  ?   ?    ?   ? ? ? ? Oral Care Recommendations: Oral care BID ?Follow Up Recommendations: Skilled nursing-short term rehab (<3 hours/day) ?Assistance recommended at discharge: Frequent or constant Supervision/Assistance ?SLP Visit Diagnosis: Dysphagia, oropharyngeal phase (R13.12) ?Plan: Continue with current plan of care ? ? ? ? ?  ?  ?Vikkie Goeden I. Hardin Negus, [place], CCC-SLP ?Acute Rehabilitation Services ?Office number [phone] ?Pager [phone] ? ? ?[name] ? ?04/13/2021, 9:18 AM ? ? ?

## 2021-04-13 NOTE — Progress Notes (Signed)
?  X-cover Note: ?RN reports pt attempting to get OOB on multiple occasions. IV ativan ineffective in keeping patient in bed. Verbal redirection ineffective. Have ordered soft posey belt restraint. ? ? ?Kristopher Oppenheim, DO ?[facility] ? ?

## 2021-04-13 NOTE — TOC Progression Note (Signed)
Transition of Care (TOC) - Progression Note  ? ? ?Patient Details  ?Name: Brittany [name] ?MRN: [id number] ?Date of Birth: 06-May-1960 ? ?Transition of Care (TOC) CM/SW Contact  ?Coralee Pesa, LCSWA ?Phone Number: ?04/13/2021, 3:55 PM ? ?Clinical Narrative:    ? ?CSW spoke with Pt's APS worker Kendall Flack [phone]) who noted he has not spoken with the patient yet, but has spoken with pt's daughter. He wants to come see pt, but she is under quarantine until the 4th. CSW notified APS worker that the recommendation is for SNF, however, a decision maker needs to be determined and APS recommendations made. Pt also may need LTC, but she does not have Medicaid, CSW will email FC to review. CSW provided sister, Lisa's # as she had many concerns and was requesting CSW reach out to her. APS stated they would follow up. ?CSW spoke with sister and she expressed concerns about the care pt was receiving here at the hospital. She also notes that she is upset that MD told her not to feed pt things that were not part of her diet. She stated she is unable to care for pt, but will bring her home if care can be arranged. She was advised APS would be following up for further information. TOC will continue to follow for DC needs. ? ?  ?  ? ?Expected Discharge Plan and Services ?  ?  ?  ?  ?  ?                ?  ?  ?  ?  ?  ?  ?  ?  ?  ?  ? ? ?Social Determinants of Health (SDOH) Interventions ?  ? ?Readmission Risk Interventions ?Readmission Risk Prevention Plan 03/06/2021 02/16/2021  ?Transportation Screening Complete Complete  ?Medication Review Press photographer) Complete Complete  ?PCP or Specialist appointment within 3-5 days of discharge Complete -  ?SW Recovery Care/Counseling Consult - Complete  ?Palliative Care Screening Not Applicable Not Applicable  ?[place] Not Applicable Not Applicable  ?Some recent data might be hidden  ? ? ?

## 2021-04-14 DIAGNOSIS — R4189 Other symptoms and signs involving cognitive functions and awareness: Secondary | ICD-10-CM | POA: Diagnosis present

## 2021-04-14 LAB — CBC
HCT: 35.3 % — ABNORMAL LOW (ref 36.0–46.0)
Hemoglobin: 11.7 g/dL — ABNORMAL LOW (ref 12.0–15.0)
MCH: 29.5 pg (ref 26.0–34.0)
MCHC: 33.1 g/dL (ref 30.0–36.0)
MCV: 89.1 fL (ref 80.0–100.0)
Platelets: 348 10*3/uL (ref 150–400)
RBC: 3.96 MIL/uL (ref 3.87–5.11)
RDW: 15.3 % (ref 11.5–15.5)
WBC: 11.4 10*3/uL — ABNORMAL HIGH (ref 4.0–10.5)
nRBC: 0 % (ref 0.0–0.2)

## 2021-04-14 LAB — GLUCOSE, CAPILLARY
Glucose-Capillary: 84 mg/dL (ref 70–99)
Glucose-Capillary: 124 mg/dL — ABNORMAL HIGH (ref 70–99)
Glucose-Capillary: 34 mg/dL — CL (ref 70–99)
Glucose-Capillary: 85 mg/dL (ref 70–99)
Glucose-Capillary: 300 mg/dL — ABNORMAL HIGH (ref 70–99)

## 2021-04-14 LAB — BASIC METABOLIC PANEL
Anion gap: 9 (ref 5–15)
BUN: 19 mg/dL (ref 8–23)
CO2: 23 mmol/L (ref 22–32)
Calcium: 9.1 mg/dL (ref 8.9–10.3)
Chloride: 100 mmol/L (ref 98–111)
Creatinine, Ser: 0.62 mg/dL (ref 0.44–1.00)
GFR, Estimated: >60 mL/min (ref >60)
Glucose, Bld: 82 mg/dL (ref 70–99)
Potassium: 5.5 mmol/L — ABNORMAL HIGH (ref 3.5–5.1)
Sodium: 132 mmol/L — ABNORMAL LOW (ref 135–145)

## 2021-04-14 LAB — MAGNESIUM: Magnesium: 1.8 mg/dL (ref 1.7–2.4)

## 2021-04-14 MED ORDER — INSULIN ASPART 100 UNIT/ML IJ SOLN
10.0000 [IU] | Freq: Three times a day (TID) | INTRAMUSCULAR | Status: DC
  Administered 2021-04-14 (×2): 10 [IU] via SUBCUTANEOUS

## 2021-04-14 MED ORDER — INSULIN GLARGINE-YFGN 100 UNIT/ML ~~LOC~~ SOLN
20.0000 [IU] | Freq: Every day | SUBCUTANEOUS | Status: DC
  Administered 2021-04-14 – 2021-04-15 (×2): 20 [IU] via SUBCUTANEOUS
  Filled 2021-04-14 (×3): qty 0.2

## 2021-04-14 MED ORDER — DIPHENHYDRAMINE HCL 50 MG/ML IJ SOLN
25.0000 mg | Freq: Once | INTRAMUSCULAR | Status: AC
  Administered 2021-04-14: 25 mg via INTRAVENOUS
  Filled 2021-04-14: qty 1

## 2021-04-14 MED ORDER — QUETIAPINE FUMARATE 25 MG PO TABS
12.5000 mg | ORAL_TABLET | Freq: Every day | ORAL | Status: DC
  Administered 2021-04-14 – 2021-04-15 (×2): 12.5 mg via ORAL
  Filled 2021-04-14 (×3): qty 1

## 2021-04-14 MED ORDER — HYDROXYZINE HCL 25 MG PO TABS
25.0000 mg | ORAL_TABLET | Freq: Four times a day (QID) | ORAL | Status: DC | PRN
  Administered 2021-04-14 – 2021-04-16 (×6): 25 mg via ORAL
  Filled 2021-04-14 (×6): qty 1

## 2021-04-14 MED ORDER — INSULIN ASPART 100 UNIT/ML IJ SOLN
8.0000 [IU] | Freq: Three times a day (TID) | INTRAMUSCULAR | Status: DC
  Administered 2021-04-15 – 2021-04-17 (×8): 8 [IU] via SUBCUTANEOUS

## 2021-04-14 MED ORDER — QUETIAPINE FUMARATE 25 MG PO TABS
25.0000 mg | ORAL_TABLET | Freq: Every day | ORAL | Status: DC
  Administered 2021-04-14 – 2021-04-17 (×4): 25 mg via ORAL
  Filled 2021-04-14 (×4): qty 1

## 2021-04-14 NOTE — Assessment & Plan Note (Addendum)
Continue Depakote 500 mg BID Continue clonazepam 0.25mg  BID Continue melatonin 6mg  PO qHS Discussed with psychiatry on 3/8, Keppra could be causing neuropsych side effects and recommend discontinuation of Keppra and starting Depakote as above.  Keppra dose reduced.

## 2021-04-14 NOTE — Progress Notes (Signed)
BS 34, patient drowsy, 1 amp D50 iv with increase in LOC noted. Dr [place] notified.  ?

## 2021-04-14 NOTE — Progress Notes (Signed)
PROGRESS NOTE    [name]  KGU:[id number] DOB: [birth year] DOA: 04/05/2021 PCP: Clinic, [facility]    Brief Narrative:  Brittany [name] is a 61 year old female with past medical history significant for hypertension, diabetes mellitus, CVA with right-sided weakness, COPD, tobacco abuse, and status post pancreatectomy and splenectomy, anxiety/depression, discoid lupus erythematosus, hyperlipidemia who presented to [facility] ED on 2/22 via EMS with confusion.    Per her daughter at baseline, patient is oriented x 3. She has a history of stroke with right-sided weakness. Normally pt can use a cane to walk. This AM, pt was found to be on the floor with confusion. Pt has jerking and slurred speech per her daughter.    Per EDP, EMS reported that the house was extremely filthy. The patient appeared to be in a soiled diaper. Pt has an colostomy with no collection bag in place. Daughter did not know which medications the patient was on.  EMS states they witnessed what could be a partial seizure, and gave the patient a total of 4 mg of intranasal Versed in route to the hospital. When I saw pt in ED. Pt is confused, not orientated x3. She has right-sided weakness which is likely from previous stroke.  No active cough, respiratory distress noted.  No active nausea and vomiting noted.  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 Pt does not seem to have chest pain.  Not sure if patient has symptoms of UTI.  Her daughter is not sure if patient has a history of seizure.  Daughter states that she found a Keppra bottle which is about 62 years old, not sure if patient is taking his medications or not.   In the ED, glucose 460, bicarbonate 18, anion gap 22, WBC 10.9, INR 1.0, troponin level 21, pending beta hydroxybutyric acid, pending urinalysis, positive COVID PCR, potassium 3.4, GFR>60, temperature normal, blood pressure 149/94, heart rate 99, RR 18, oxygen saturation 98% on room  air.  ABG with pH 7.36, CO2 36, O2 52. Patient is admitted to stepdown as inpatient. Patient was noted to have DKA, subclinical seizure on EEG and incidental finding of COVID-19 viral infection.  Neurology was consulted and patient was transferred to [facility] for continuous EEG.  Admitted to the hospital service.      Assessment & Plan:   Assessment and Plan: * New onset seizure ([place]) Suspect underlying etiology is previous CVA due to lowered seizure threshold made worse by DKA/COVID.  Neurology was consulted and followed during initial hospital course.  --Keppra 500 mg p.o. twice daily --Outpatient follow-up with neurology in 2 to 3 months.  Acute metabolic encephalopathy Improved since admission, but seems to be waxing and waning.  Likely complicated by hospital-acquired delirium and fluctuations in glucose due to noncompliance with diet.  Working with physical and Occupational Therapy.  Will need to discharge to skilled nursing for rehab and then may need to live with her sister as she is likely unable to manage her ADLs at home alone.  DKA (diabetic ketoacidosis) ([place]) Patient presenting with confusion, found to have elevated blood glucose of 460 with an anion gap of 22 on admission.  A1c 15.0 correlating with extremely poor control outpatient.  Etiology likely secondary to medication noncompliance outpatient with her insulin.  She was initially started on insulin drip and transition to subcutaneous insulin.  Diabetic educator following. --Continue diabetic control as below.  Type 2 diabetes mellitus with hyperlipidemia (HCC) Hemoglobin A1c 15.0,  poorly controlled.  Suspect noncompliance outpatient and dietary discretions while inpatient. --Diabetic educator following, appreciate assistance --Semglee 25u [place] daily --Novolog 10u TIDAC --SSI for coverage --CBGs qAC/HS --Continue monitor blood sugars closely and continue to adjust  Essential hypertension --Benazepril 10 mg  p.o. daily  Hypokalemia Repleted during the hospitalization.  Continue to monitor intermittently.  Restless leg syndrome --Ropinirole 0.5 mg p.o. qHS  Elevated troponin Etiology likely secondary to type II demand ischemia in the setting of DKA and seizure as above.  EKG with no concerning dynamic changes.  No concerning arrhythmias noted on telemetry, now discontinued.    History of stroke --Continue statin --Not on aspirin/Plavix outpatient  Hypomagnesemia Repleted during hospitalization.  Continue to monitor intermittently.  COVID-19 virus infection Incidental finding with positive PCR on 04/05/2021. --Continue airborne/contact isolation precautions for 10 days while inpatient; likely can discontinue 3/5.  Status post colostomy [facility]) --Wound care consulted for continued colostomy recommendations while inpatient  COPD, mild ([place]) Stable.  Currently on room air --Albuterol MDI and 1-2 puffs every 4 hours.  Shortness of breath/wheezing  Protein-calorie malnutrition, severe Nutrition Status: Nutrition Problem: Severe Malnutrition Etiology: chronic illness (cancer, lupus) Signs/Symptoms: severe muscle depletion, severe fat depletion, percent weight loss Percent weight loss: 16.5 % (x5 months) Seen by nutrition.  Regular diet with thin liquids.  Continue to encourage increased supplementation.  Pressure injury of skin Continue local wound care, offloading.  Behavior related to cognitive impairment --Seroquel 12.5mg  PO daily and 25mg  PO qHS    DVT prophylaxis:   Lovenox   Code Status: Full Code Family Communication: No family present at bedside this morning  Disposition Plan:  Level of care: Med-Surg Status is: Inpatient Remains inpatient appropriate because: Pending SNF placement, likely will need to remain inpatient for 10-day isolation/quarantine     Consultants:  Neurology - signed off 2/27  Procedures:  EEG  Antimicrobials:  None   Subjective: Patient  seen examined bedside, resting comfortably.  Pleasantly confused. Asking for breakfast. RN at bedside. No family present.  Long discussion with patient's sister via telephone yesterday, apparently she is upset that I told her that she cannot be bringing in [facility] and giving her sister cakes as this is complicating her diabetic control.    Remains on airborne/contact isolation for incidental COVID-19 viral infection.  No specific questions or concerns this morning.  Awaiting SNF placement which will likely be difficult per discussions with social work. Denies headache, no chest pain, shortness of breath, no abdominal pain.  No acute events overnight per nursing staff.  Objective: Vitals:   04/13/21 2004 04/14/21 0025 04/14/21 0600 04/14/21 0856  BP:  102/78 (!) 107/58 91/64  Pulse: (!) 110 96  (!) 110  Resp:  20  20  Temp:  98.2 F (36.8 C)  97.8 F (36.6 C)  TempSrc:  Axillary  Oral  SpO2: 100% 96%  94%  Weight:      Height:        Intake/Output Summary (Last 24 hours) at 04/14/2021 1215 Last data filed at 04/13/2021 2004 Gross per 24 hour  Intake 120 ml  Output 2 ml  Net 118 ml   Filed Weights   04/08/21 0413 04/09/21 0500 04/13/21 0436  Weight: 42.4 kg 43.6 kg 43 kg    Examination:  Physical Exam: GEN: NAD, alert, pleasantly confused, thin/cachectic in appearance, appears older than stated age HEENT: NCAT, PERRL, EOMI, sclera clear, MMM PULM: CTAB w/o wheezes/crackles, normal respiratory effort, on room air CV: RRR w/o M/G/R GI:  abd soft, NTND, NABS, no R/G/M MSK: no peripheral edema, muscle strength globally intact 5/5 bilateral upper/lower extremities NEURO: CN II-XII intact, no focal deficits, sensation to light touch intact PSYCH: normal mood/affect Integumentary: Stage II wounds noted left buttock likely from excoriation    Data Reviewed: I have personally reviewed following labs and imaging studies  CBC: Recent Labs  Lab 04/08/21 0553 04/14/21 0543  WBC 6.1 11.4*   HGB 11.6* 11.7*  HCT 34.8* 35.3*  MCV 89.2 89.1  PLT 415* 016   Basic Metabolic Panel: Recent Labs  Lab 04/07/21 1330 04/07/21 1844 04/08/21 0553 04/08/21 0553 04/08/21 1624 04/09/21 0159 04/10/21 0229 04/12/21 0041 04/12/21 0626 04/14/21 0543  NA  --   --  135  --  135 135 137  --   --  132*  K  --   --  2.1*  --  3.7 3.8 4.2  --   --  5.5*  CL  --   --  96*  --  98 100 101  --   --  100  CO2  --   --  30  --  27 25 29   --   --  23  GLUCOSE  --   --  302*   < > 175* 116* 167* 346*  --  82  BUN  --   --  <5*  --  <5* 5* 8  --   --  19  CREATININE  --   --  0.52   < > 0.56 0.45 0.57  --  0.71 0.62  CALCIUM  --   --  7.5*  --  7.9* 8.0* 8.6*  --   --  9.1  MG 1.3* 1.3* 1.3*  --  2.2  --   --   --   --  1.8  PHOS 2.2* 2.5 3.5  --  3.4  --   --   --   --   --    < > = values in this interval not displayed.   GFR: Estimated Creatinine Clearance: 50.1 mL/min (by C-G formula based on SCr of 0.62 mg/dL). Liver Function Tests: Recent Labs  Lab 04/08/21 0553 04/10/21 0229  AST 57* 28  ALT 36 29  ALKPHOS 112 125  BILITOT 0.3 0.3  PROT 5.7* 6.0*  ALBUMIN 2.0* 2.1*   No results for input(s): LIPASE, AMYLASE in the last 168 hours. Recent Labs  Lab 04/07/21 1816  AMMONIA 39*   Coagulation Profile: No results for input(s): INR, PROTIME in the last 168 hours. Cardiac Enzymes: No results for input(s): CKTOTAL, CKMB, CKMBINDEX, TROPONINI in the last 168 hours. BNP (last 3 results) No results for input(s): PROBNP in the last 8760 hours. HbA1C: No results for input(s): HGBA1C in the last 72 hours. CBG: Recent Labs  Lab 04/13/21 0622 04/13/21 1303 04/13/21 1659 04/13/21 2145 04/14/21 0610  GLUCAP 410* 179* 156* 203* 84   Lipid Profile: No results for input(s): CHOL, HDL, LDLCALC, TRIG, CHOLHDL, LDLDIRECT in the last 72 hours. Thyroid Function Tests: No results for input(s): TSH, T4TOTAL, FREET4, T3FREE, THYROIDAB in the last 72 hours. Anemia Panel: No results for  input(s): VITAMINB12, FOLATE, FERRITIN, TIBC, IRON, RETICCTPCT in the last 72 hours. Sepsis Labs: No results for input(s): PROCALCITON, LATICACIDVEN in the last 168 hours.  Recent Results (from the past 240 hour(s))  CULTURE, BLOOD (ROUTINE X 2) w Reflex to ID Panel     Status: None   Collection Time: 04/05/21  8:24 AM  Specimen: BLOOD  Result Value Ref Range Status   Specimen Description BLOOD RIGHT ANTECUBITAL  Final   Special Requests BOTTLES DRAWN AEROBIC ONLY BCAV  Final   Culture   Final    NO GROWTH 5 DAYS Performed at [facility], [address] [place], [place] [address]    Report Status 04/10/2021 FINAL  Final  Resp Panel by RT-PCR (Flu A&B, Covid) Nasopharyngeal Swab     Status: Abnormal   Collection Time: 04/05/21  8:25 AM   Specimen: Nasopharyngeal Swab; Nasopharyngeal(NP) swabs in vial transport medium  Result Value Ref Range Status   SARS Coronavirus 2 by RT PCR POSITIVE (A) NEGATIVE Final    Comment: (NOTE) SARS-CoV-2 target nucleic acids are DETECTED.  The SARS-CoV-2 RNA is generally detectable in upper respiratory specimens during the acute phase of infection. Positive results are indicative of the presence of the identified virus, but do not rule out bacterial infection or co-infection with other pathogens not detected by the test. Clinical correlation with patient history and other diagnostic information is necessary to determine patient infection status. The expected result is Negative.  Fact Sheet for Patients: EntrepreneurPulse.com.au  Fact Sheet for Healthcare Providers: IncredibleEmployment.be  This test is not yet approved or cleared by the [place] FDA and  has been authorized for detection and/or diagnosis of SARS-CoV-2 by FDA under an Emergency Use Authorization (EUA).  This EUA will remain in effect (meaning this test can be used) for the duration of  the COVID-19 declaration under Section  564(b)(1) of the A ct, 21 U.S.C. section 360bbb-3(b)(1), unless the authorization is terminated or revoked sooner.     Influenza A by PCR NEGATIVE NEGATIVE Final   Influenza B by PCR NEGATIVE NEGATIVE Final    Comment: (NOTE) The Xpert Xpress SARS-CoV-2/FLU/RSV plus assay is intended as an aid in the diagnosis of influenza from Nasopharyngeal swab specimens and should not be used as a sole basis for treatment. Nasal washings and aspirates are unacceptable for Xpert Xpress SARS-CoV-2/FLU/RSV testing.  Fact Sheet for Patients: EntrepreneurPulse.com.au  Fact Sheet for Healthcare Providers: IncredibleEmployment.be  This test is not yet approved or cleared by the [place] FDA and has been authorized for detection and/or diagnosis of SARS-CoV-2 by FDA under an Emergency Use Authorization (EUA). This EUA will remain in effect (meaning this test can be used) for the duration of the COVID-19 declaration under Section 564(b)(1) of the Act, 21 U.S.C. section 360bbb-3(b)(1), unless the authorization is terminated or revoked.  Performed at [facility], [address] [place], [place] [address]          Radiology Studies: No results found.      Scheduled Meds:  atorvastatin  40 mg Oral QHS   benazepril  10 mg Oral Daily   enoxaparin (LOVENOX) injection  20 mg Subcutaneous Q24H   feeding supplement (GLUCERNA SHAKE)  237 mL Oral TID BM   Gerhardt's butt cream   Topical QID   insulin aspart  0-5 Units Subcutaneous QHS   insulin aspart  0-9 Units Subcutaneous TID WC   insulin aspart  10 Units Subcutaneous TID WC   insulin glargine-yfgn  25 Units Subcutaneous QHS   levETIRAcetam  500 mg Oral BID   lipase/protease/amylase  72,000 Units Oral TID with meals   melatonin  3 mg Oral QHS   multivitamin with minerals  1 tablet Oral Daily   QUEtiapine  12.5 mg Oral Q breakfast   QUEtiapine  25 mg Oral QHS  rOPINIRole  0.5 mg Oral  QHS   thiamine  100 mg Oral Daily   Continuous Infusions:   LOS: 9 days    Time spent: 41 minutes spent on chart review, discussion with nursing staff, consultants, updating family and interview/physical exam; more than 50% of that time was spent in counseling and/or coordination of care.    Mizael Sagar J [place] DO [facility] Available via Epic secure chat 7am-7pm After these hours, please refer to coverage provider listed on [email or web address] 04/14/2021, 12:15 PM

## 2021-04-14 NOTE — TOC Progression Note (Addendum)
Transition of Care (TOC) - Progression Note  ? ? ?Patient Details  ?Name: Brittany [name] ?MRN: [id number] ?Date of Birth: 11-15-1960 ? ?Transition of Care (TOC) CM/SW Contact  ?K[place]ela [place], LCSW ?Phone Number: ?04/14/2021, 10:47 AM ? ?Clinical Narrative:    ? ?CSW contacted SNF facilities that have no responded to referral and requested review/decision regarding having an available bed for pt; awaiting responses. ? ?CSW made referral to financial navigators to be screened for medicaid.   ? ?1300: CSW spoke with [place] at [facility]; they can accept pt once pt has been out of restraints for 48 hours.  ? ?Expected Discharge Plan: [place] ?Barriers to Discharge: SNF Covid, No SNF bed ? ?Expected Discharge Plan and Services ?Expected Discharge Plan: [place] ?  ?  ?  ?  ?                ?  ?  ?  ?  ?  ?  ?  ?  ?  ?  ? ? ?Social Determinants of Health (SDOH) Interventions ?  ? ?Readmission Risk Interventions ?Readmission Risk Prevention Plan 03/06/2021 02/16/2021  ?Transportation Screening Complete Complete  ?Medication Review Press photographer) Complete Complete  ?PCP or Specialist appointment within 3-5 days of discharge Complete -  ?SW Recovery Care/Counseling Consult - Complete  ?Palliative Care Screening Not Applicable Not Applicable  ?[place] Not Applicable Not Applicable  ?Some recent data might be hidden  ? ? ?

## 2021-04-14 NOTE — Progress Notes (Signed)
Speech Language Pathology Treatment: Dysphagia  ?Patient Details ?Name: Brittany [name] ?MRN: [id number] ?DOB: 24-Nov-1960 ?Today's Date: 04/14/2021 ?Time: 1030-1049 ?SLP Time Calculation (min) (ACUTE ONLY): 19 min ? ?Assessment / Plan / Recommendation ?Clinical Impression ? Pt was seen during breakfast for dysphagia treatment and she was cooperative throughout the session.  Pt, and [place], RN reported that the pt has been tolerating the current diet without overt s/sx of aspiration. Pt self-fed and consumed a meal of scrambled eggs, bacon, grits, and thin liquids. Pt's intake rate was increased and bolus sizes were intermittently large, but she tolerated solids and thin liquids via straw using individual and consecutive swallows without symptoms of oropharyngeal dysphagia. It is recommended that the current diet be continued. Further skilled SLP services are not clinically indicated at this time.  ?  ?HPI HPI: 61 year old female with normal baseline of oriented x3 brought in by EMS for confusion plus DKA.  EEG noted subclinical seizure.  Patient started on Keppra and transferred to [facility] from [facility] regional for continuous 24-hour EEG.  Since arrival, patient has been grossly confused, trying to rip out lines, Foley catheter.  MRI 2/22 with no acute findings. Pt has past medical history of hypertension, diabetes mellitus, CVA with right-sided weakness, tobacco abuse, COPD and status post pancreatectomy and splenectomy. ?  ?   ?SLP Plan ? All goals met;Discharge SLP treatment due to (comment) ? ?  ?  ?Recommendations for follow up therapy are one component of a multi-disciplinary discharge planning process, led by the attending physician.  Recommendations may be updated based on patient status, additional functional criteria and insurance authorization. ?  ? ?Recommendations  ?Diet recommendations: Regular;Thin liquid ?Liquids provided via: Cup;Straw ?Medication Administration: Whole meds with puree (or with liquid  was tolerated) ?Supervision: Patient able to self feed ?Compensations: Slow rate;Small sips/bites ?Postural Changes and/or Swallow Maneuvers: Seated upright 90 degrees  ?   ?    ?   ? ? ? ? Oral Care Recommendations: Oral care BID ?Follow Up Recommendations: Skilled nursing-short term rehab (<3 hours/day) ?Assistance recommended at discharge: Frequent or constant Supervision/Assistance ?SLP Visit Diagnosis: Dysphagia, oropharyngeal phase (R13.12) ?Plan: All goals met;Discharge SLP treatment due to (comment) ? ? ? ? ?  ?  ?Lavine Hargrove I. Hardin Negus, [place], CCC-SLP ?Acute Rehabilitation Services ?Office number [phone] ?Pager [phone] ? ? ?Brittany [name] ? ?04/14/2021, 11:30 AM ? ? ? ? ?

## 2021-04-14 NOTE — Progress Notes (Signed)
Pt confused, pt stated she needed help because she was itching so much. Pt noted, on all fours, with restraint below her knees.  Pt noted with mittens removed, scratching all over with bare nails, pt noted buttocks bleeding and pt scatching that area. PRN atarax given earlier not effective. On call Provider paged to inform, received order for benadryl. 0120 Benadryl given per order. Pt restraint reapplied. Bed alarms on. Pt has order for telesitter but telesitter not available at this time. Regular sitter requested no sitters available at this time. Pt wiped off, clean linens applied to bed, new gown applied. Multiple blankets per pt request.  ?

## 2021-04-14 NOTE — Plan of Care (Signed)
? ?  Problem: Education: ?Goal: Knowledge of General Education information will improve ?Description: Including pain rating scale, medication(s)/side effects and non-pharmacologic comfort measures ?Outcome: Progressing ?  ?Problem: Health Behavior/Discharge Planning: ?Goal: Ability to manage health-related needs will improve ?Outcome: Progressing ?  ?Problem: Clinical Measurements: ?Goal: Ability to maintain clinical measurements within normal limits will improve ?Outcome: Progressing ?Goal: Will remain free from infection ?Outcome: Progressing ?Goal: Diagnostic test results will improve ?Outcome: Progressing ?Goal: Respiratory complications will improve ?Outcome: Progressing ?Goal: Cardiovascular complication will be avoided ?Outcome: Progressing ?  ?Problem: Activity: ?Goal: Risk for activity intolerance will decrease ?Outcome: Progressing ?  ?Problem: Nutrition: ?Goal: Adequate nutrition will be maintained ?Outcome: Progressing ?  ?Problem: Coping: ?Goal: Level of anxiety will decrease ?Outcome: Progressing ?  ?Problem: Elimination: ?Goal: Will not experience complications related to bowel motility ?Outcome: Progressing ?Goal: Will not experience complications related to urinary retention ?Outcome: Progressing ?  ?Problem: Pain Managment: ?Goal: General experience of comfort will improve ?Outcome: Progressing ?  ?Problem: Safety: ?Goal: Ability to remain free from injury will improve ?Outcome: Progressing ?  ?Problem: Skin Integrity: ?Goal: Risk for impaired skin integrity will decrease ?Outcome: Progressing ?  ?Problem: Safety: ?Goal: Non-violent Restraint(s) ?Outcome: Progressing ?  ?Problem: Education: ?Goal: Knowledge of risk factors and measures for prevention of condition will improve ?Outcome: Progressing ?  ?Problem: Coping: ?Goal: Psychosocial and spiritual needs will be supported ?Outcome: Progressing ?  ?Problem: Respiratory: ?Goal: Will maintain a patent airway ?Outcome: Progressing ?Goal:  Complications related to the disease process, condition or treatment will be avoided or minimized ?Outcome: Progressing ?  ?

## 2021-04-14 NOTE — Assessment & Plan Note (Signed)
Continue local wound care, offloading. ?

## 2021-04-14 NOTE — Progress Notes (Signed)
Physical Therapy Treatment ?Patient Details ?Name: Brittany [name] ?MRN: [id number] ?DOB: 1960-03-23 ?Today's Date: 04/14/2021 ? ? ?History of Present Illness Brittany [name] is a 28yoF who was brought to the ED for confusion plus DKA.  EEG noted subclinical seizure.  Also found to have incidental COVID. 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 Pt admitted with diverticulitis of intestine with abscess. ? ?  ?PT Comments  ? ? Pt soiled in busted colostomy bag stool upon PT arrival to room, pt requesting assist for clean up. RN and NT arrived to room to assist. Pt overall requiring min assist for bed mobility, transfers, and short-distance gait in room with use of RW. Sacral pad donned by NT per PT request given sores on buttocks. Pt remains appropriate to d/c to SNF level of care post-acutely.  ?   ?Recommendations for follow up therapy are one component of a multi-disciplinary discharge planning process, led by the attending physician.  Recommendations may be updated based on patient status, additional functional criteria and insurance authorization. ? ?Follow Up Recommendations ? Skilled nursing-short term rehab (<3 hours/day) ?  ?  ?Assistance Recommended at Discharge Frequent or constant Supervision/Assistance  ?Patient can return home with the following A little help with walking and/or transfers;A little help with bathing/dressing/bathroom;Assistance with cooking/housework;Direct supervision/assist for medications management;Help with stairs or ramp for entrance;Assist for transportation ?  ?Equipment Recommendations ? None recommended by PT  ?  ?Recommendations for Other Services   ? ? ?  ?Precautions / Restrictions Precautions ?Precautions: Fall ?Precaution Comments: seizure, COVID+ ?Restrictions ?Weight Bearing Restrictions: No  ?  ? ?Mobility ? Bed Mobility ?Overal bed mobility: Needs  Assistance ?Bed Mobility: Supine to Sit, Sit to Supine ?  ?  ?Supine to sit: Min assist ?Sit to supine: Min assist ?  ?General bed mobility comments: assist for LE progression to EOB, trunk elevation when moving supine>sit, and boost up in bed upon return to supine. ?  ? ?Transfers ?Overall transfer level: Needs assistance ?Equipment used: Rolling walker (2 wheels) ?Transfers: Sit to/from Stand ?Sit to Stand: Min assist ?  ?  ?  ?  ?  ?General transfer comment: light assist to rise, steady, cues for hand placement when rising/sitting. ?  ? ?Ambulation/Gait ?Ambulation/Gait assistance: Min assist ?Gait Distance (Feet): 20 Feet ?Assistive device: Rolling walker (2 wheels) ?Gait Pattern/deviations: Step-through pattern, Decreased stride length, Narrow base of support ?Gait velocity: decr ?  ?  ?General Gait Details: cues for upright posture, placement in RW, assist to steady as needed . ? ? ?Stairs ?  ?  ?  ?  ?  ? ? ?Wheelchair Mobility ?  ? ?Modified Rankin (Stroke Patients Only) ?  ? ? ?  ?Balance Overall balance assessment: Needs assistance ?Sitting-balance support: No upper extremity supported, Feet supported ?Sitting balance-Leahy Scale: Good ?  ?  ?Standing balance support: Bilateral upper extremity supported, During functional activity ?Standing balance-Leahy Scale: Poor ?Standing balance comment: reliant on external support ?  ?  ?  ?  ?  ?  ?  ?  ?  ?  ?  ?  ? ?  ?Cognition Arousal/Alertness: Awake/alert ?Behavior During Therapy: [facility] for tasks assessed/performed ?Overall Cognitive Status: No family/caregiver present to determine baseline cognitive functioning ?Area of Impairment: Attention ?  ?  ?  ?  ?  ?  ?  ?  ?  ?Current Attention  Level: Sustained ?Memory: Decreased recall of precautions, Decreased short-term memory ?Following Commands: Follows one step commands with increased time, Follows one step commands inconsistently ?Safety/Judgement: Decreased awareness of safety, Decreased awareness of  deficits ?Awareness: Intellectual ?Problem Solving: Slow processing, Decreased initiation, Difficulty sequencing, Requires verbal cues ?General Comments: Pt had pulled colostomy bag off self with stool all over pt and bed, pt expressing "I need to be cleaned up, there's a mess". Pt follows one-step commands consistently ?  ?  ? ?  ?Exercises   ? ?  ?General Comments   ?  ?  ? ?Pertinent Vitals/Pain Pain Assessment ?Pain Assessment: Faces ?Faces Pain Scale: Hurts even more ?Pain Location: buttocks ?Pain Descriptors / Indicators: Sore, Other (Comment) (itching) ?Pain Intervention(s): Limited activity within patient's tolerance, Monitored during session, Repositioned  ? ? ?Home Living   ?  ?  ?  ?  ?  ?  ?  ?  ?  ?   ?  ?Prior Function    ?  ?  ?   ? ?PT Goals (current goals can now be found in the care plan section) Acute Rehab PT Goals ?Patient Stated Goal: none stated by patient ?PT Goal Formulation: With patient ?Time For Goal Achievement: 04/25/21 ?Potential to Achieve Goals: Good ?Progress towards PT goals: Progressing toward goals ? ?  ?Frequency ? ? ? Min 2X/week ? ? ? ?  ?PT Plan Current plan remains appropriate  ? ? ?Co-evaluation   ?  ?  ?  ?  ? ?  ?AM-PAC PT "6 Clicks" Mobility   ?Outcome Measure ? Help needed turning from your back to your side while in a flat bed without using bedrails?: A Little ?Help needed moving from lying on your back to sitting on the side of a flat bed without using bedrails?: A Little ?Help needed moving to and from a bed to a chair (including a wheelchair)?: A Little ?Help needed standing up from a chair using your arms (e.g., wheelchair or bedside chair)?: A Little ?Help needed to walk in hospital room?: A Little ?Help needed climbing 3-5 steps with a railing? : A Little ?6 Click Score: 18 ? ?  ?End of Session   ?Activity Tolerance: Patient limited by fatigue;Patient tolerated treatment well ?Patient left: in bed;with call bell/phone within reach;with bed alarm set;with  restraints reapplied;Other (comment) (mitts and waist restraint donned per RN request) ?Nurse Communication: Mobility status ?PT Visit Diagnosis: Unsteadiness on feet (R26.81);Muscle weakness (generalized) (M62.81);Difficulty in walking, not elsewhere classified (R26.2);Ataxic gait (R26.0) ?  ? ? ?Time: 3662-9476 ?PT Time Calculation (min) (ACUTE ONLY): 26 min ? ?Charges:  $Therapeutic Activity: 8-22 mins          ?          ?Stacie Glaze, PT DPT ?Acute Rehabilitation Services ?Pager [phone]  ?Office [phone] ? ? ? ?Kassius Battiste E Stroup ?04/14/2021, 1:01 PM ? ?

## 2021-04-15 LAB — GLUCOSE, CAPILLARY
Glucose-Capillary: 285 mg/dL — ABNORMAL HIGH (ref 70–99)
Glucose-Capillary: 194 mg/dL — ABNORMAL HIGH (ref 70–99)
Glucose-Capillary: 319 mg/dL — ABNORMAL HIGH (ref 70–99)
Glucose-Capillary: 244 mg/dL — ABNORMAL HIGH (ref 70–99)

## 2021-04-15 MED ORDER — DIPHENHYDRAMINE HCL 50 MG/ML IJ SOLN
25.0000 mg | Freq: Once | INTRAMUSCULAR | Status: AC
  Administered 2021-04-15: 25 mg via INTRAVENOUS
  Filled 2021-04-15: qty 1

## 2021-04-15 NOTE — Progress Notes (Signed)
PROGRESS NOTE    Brittany [name]  XBJ:[id number] DOB: 03-14-1960 DOA: 04/05/2021 PCP: [facility]    Brief Narrative:  Brittany [name] is a 61 year old female with past medical history significant for hypertension, diabetes mellitus, CVA with right-sided weakness, COPD, tobacco abuse, and status post pancreatectomy and splenectomy, anxiety/depression, discoid lupus erythematosus, hyperlipidemia who presented to [facility] ED on 2/22 via EMS with confusion.    Per her daughter at baseline, patient is oriented x 3. She has a history of stroke with right-sided weakness. Normally pt can use a cane to walk. This AM, pt was found to be on the floor with confusion. Pt has jerking and slurred speech per her daughter.    Per EDP, EMS reported that the house was extremely filthy. The patient appeared to be in a soiled diaper. Pt has an colostomy with no collection bag in place. Daughter did not know which medications the patient was on.  EMS states they witnessed what could be a partial seizure, and gave the patient a total of 4 mg of intranasal Versed in route to the hospital. When I saw pt in ED. Pt is confused, not orientated x3. She has right-sided weakness which is likely from previous stroke.  No active cough, respiratory distress noted.  No active nausea and vomiting noted.  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 Pt does not seem to have chest pain.  Not sure if patient has symptoms of UTI.  Her daughter is not sure if patient has a history of seizure.  Daughter states that she found a Keppra bottle which is about 62 years old, not sure if patient is taking his medications or not.   In the ED, glucose 460, bicarbonate 18, anion gap 22, WBC 10.9, INR 1.0, troponin level 21, pending beta hydroxybutyric acid, pending urinalysis, positive COVID PCR, potassium 3.4, GFR>60, temperature normal, blood pressure 149/94, heart rate 99, RR 18, oxygen saturation 98% on room  air.  ABG with pH 7.36, CO2 36, O2 52. Patient is admitted to stepdown as inpatient. Patient was noted to have DKA, subclinical seizure on EEG and incidental finding of COVID-19 viral infection.  Neurology was consulted and patient was transferred to [facility] for continuous EEG.  Admitted to the hospital service.      Assessment & Plan:   Assessment and Plan: * New onset seizure ([place]) Suspect underlying etiology is previous CVA due to lowered seizure threshold made worse by DKA/COVID.  Neurology was consulted and followed during initial hospital course.  --Keppra 500 mg p.o. twice daily --Outpatient follow-up with neurology in 2 to 3 months.  Acute metabolic encephalopathy Improved since admission, but seems to be waxing and waning.  Likely complicated by hospital-acquired delirium and fluctuations in glucose due to noncompliance with diet.  Working with physical and Occupational Therapy.  Will need to discharge to skilled nursing for rehab and then may need to live with her sister as she is likely unable to manage her ADLs at home alone.  DKA (diabetic ketoacidosis) ([place]) Patient presenting with confusion, found to have elevated blood glucose of 460 with an anion gap of 22 on admission.  A1c 15.0 correlating with extremely poor control outpatient.  Etiology likely secondary to medication noncompliance outpatient with her insulin.  She was initially started on insulin drip and transitioned to subcutaneous insulin.  Diabetic educator following. --Continue diabetic control as below.  Type 2 diabetes mellitus with hyperlipidemia (H[facility]) Hemoglobin A1c 15.0,  poorly controlled.  Suspect noncompliance outpatient and dietary discretions while inpatient. --Diabetic educator following, appreciate assistance --Semglee 20u [place] daily --Novolog 8u TIDAC --SSI for coverage --CBGs qAC/HS --Continue monitor blood sugars closely and continue to adjust  Essential hypertension --Benazepril 10 mg  p.o. daily  Hypokalemia Repleted during the hospitalization.  Continue to monitor intermittently.  Restless leg syndrome --Ropinirole 0.5 mg p.o. qHS  Elevated troponin Etiology likely secondary to type II demand ischemia in the setting of DKA and seizure as above.  EKG with no concerning dynamic changes.  No concerning arrhythmias noted on telemetry, now discontinued.    History of stroke --Continue statin --Not on aspirin/Plavix outpatient  Hypomagnesemia Repleted during hospitalization.  Continue to monitor intermittently.  COVID-19 virus infection Incidental finding with positive PCR on 04/05/2021. --Continue airborne/contact isolation precautions for 10 days while inpatient; likely can discontinue 3/5.  Status post colostomy [facility]) --Wound care consulted for continued colostomy recommendations while inpatient  COPD, mild ([place]) Stable.  Currently on room air --Albuterol MDI and 1-2 puffs every 4 hours.  Shortness of breath/wheezing  Protein-calorie malnutrition, severe Nutrition Status: Nutrition Problem: Severe Malnutrition Etiology: chronic illness (cancer, lupus) Signs/Symptoms: severe muscle depletion, severe fat depletion, percent weight loss Percent weight loss: 16.5 % (x5 months) Seen by nutrition.  Regular diet with thin liquids.  Continue to encourage increased supplementation.  Pressure injury of skin Continue local wound care, offloading.  Behavior related to cognitive impairment --Seroquel 12.'5mg'$  PO daily and '25mg'$  PO qHS    DVT prophylaxis:   Lovenox   Code Status: Full Code Family Communication: No family present at bedside this morning  Disposition Plan:  Level of care: Med-Surg Status is: Inpatient Remains inpatient appropriate because: Pending SNF placement     Consultants:  Neurology - signed off 2/27  Procedures:  EEG  Antimicrobials:  None   Subjective: Patient seen examined bedside, resting comfortably.  Remains pleasantly  confused with restraints lapbelt and hand mitts for pulling at lines.  No family present.  RN present at bedside.  Continues to report intermittent itching. No other specific questions or concerns this morning.  Awaiting SNF placement which will likely be difficult per discussions with social work. Denies headache, no chest pain, shortness of breath, no abdominal pain.  No acute events overnight per nursing staff.  Objective: Vitals:   04/14/21 2133 04/15/21 0122 04/15/21 0406 04/15/21 0858  BP: 135/89 1'09/77 97/67 95/66 '$  Pulse: 95 90 97 91  Resp: '16 20 20   '$ Temp: 98.6 F (37 C) 98.6 F (37 C) 99.8 F (37.7 C)   TempSrc: Oral Oral Oral   SpO2: 98% 98% 97% 99%  Weight:      Height:       No intake or output data in the 24 hours ending 04/15/21 1234  Filed Weights   04/08/21 0413 04/09/21 0500 04/13/21 0436  Weight: 42.4 kg 43.6 kg 43 kg    Examination:  Physical Exam: GEN: NAD, alert, pleasantly confused, thin/cachectic in appearance, appears older than stated age HEENT: NCAT, PERRL, EOMI, sclera clear, MMM PULM: CTAB w/o wheezes/crackles, normal respiratory effort, on room air CV: RRR w/o M/G/R GI: abd soft, NTND, NABS, no R/G/M MSK: no peripheral edema, muscle strength globally intact 5/5 bilateral upper/lower extremities NEURO: CN II-XII intact, no focal deficits, sensation to light touch intact PSYCH: normal mood/affect Integumentary: Stage II wounds noted left buttock likely from excoriation    Data Reviewed: I have personally reviewed following labs and imaging studies  CBC: Recent  Labs  Lab 04/14/21 0543  WBC 11.4*  HGB 11.7*  HCT 35.3*  MCV 89.1  PLT 553   Basic Metabolic Panel: Recent Labs  Lab 04/08/21 1624 04/09/21 0159 04/10/21 0229 04/12/21 0041 04/12/21 0626 04/14/21 0543  NA 135 135 137  --   --  132*  K 3.7 3.8 4.2  --   --  5.5*  CL 98 100 101  --   --  100  CO2 '27 25 29  '$ --   --  23  GLUCOSE 175* 116* 167* 346*  --  82  BUN <5* 5* 8   --   --  19  CREATININE 0.56 0.45 0.57  --  0.71 0.62  CALCIUM 7.9* 8.0* 8.6*  --   --  9.1  MG 2.2  --   --   --   --  1.8  PHOS 3.4  --   --   --   --   --    GFR: Estimated Creatinine Clearance: 50.1 mL/min (by C-G formula based on SCr of 0.62 mg/dL). Liver Function Tests: Recent Labs  Lab 04/10/21 0229  AST 28  ALT 29  ALKPHOS 125  BILITOT 0.3  PROT 6.0*  ALBUMIN 2.1*   No results for input(s): LIPASE, AMYLASE in the last 168 hours. No results for input(s): AMMONIA in the last 168 hours.  Coagulation Profile: No results for input(s): INR, PROTIME in the last 168 hours. Cardiac Enzymes: No results for input(s): CKTOTAL, CKMB, CKMBINDEX, TROPONINI in the last 168 hours. BNP (last 3 results) No results for input(s): PROBNP in the last 8760 hours. HbA1C: No results for input(s): HGBA1C in the last 72 hours. CBG: Recent Labs  Lab 04/14/21 1249 04/14/21 1714 04/14/21 1748 04/14/21 2137 04/15/21 0649  GLUCAP 124* 34* 85 300* 285*   Lipid Profile: No results for input(s): CHOL, HDL, LDLCALC, TRIG, CHOLHDL, LDLDIRECT in the last 72 hours. Thyroid Function Tests: No results for input(s): TSH, T4TOTAL, FREET4, T3FREE, THYROIDAB in the last 72 hours. Anemia Panel: No results for input(s): VITAMINB12, FOLATE, FERRITIN, TIBC, IRON, RETICCTPCT in the last 72 hours. Sepsis Labs: No results for input(s): PROCALCITON, LATICACIDVEN in the last 168 hours.  No results found for this or any previous visit (from the past 240 hour(s)).        Radiology Studies: No results found.      Scheduled Meds:  atorvastatin  40 mg Oral QHS   benazepril  10 mg Oral Daily   enoxaparin (LOVENOX) injection  20 mg Subcutaneous Q24H   feeding supplement (GLUCERNA SHAKE)  237 mL Oral TID BM   Gerhardt's butt cream   Topical QID   insulin aspart  0-5 Units Subcutaneous QHS   insulin aspart  0-9 Units Subcutaneous TID WC   insulin aspart  8 Units Subcutaneous TID WC   insulin  glargine-yfgn  20 Units Subcutaneous QHS   levETIRAcetam  500 mg Oral BID   lipase/protease/amylase  72,000 Units Oral TID with meals   melatonin  3 mg Oral QHS   multivitamin with minerals  1 tablet Oral Daily   QUEtiapine  12.5 mg Oral Q breakfast   QUEtiapine  25 mg Oral QHS   rOPINIRole  0.5 mg Oral QHS   thiamine  100 mg Oral Daily   Continuous Infusions:   LOS: 10 days    Time spent: 41 minutes spent on chart review, discussion with nursing staff, consultants, updating family and interview/physical exam; more than 50% of that  time was spent in counseling and/or coordination of care.    Lemya Greenwell J [place] DO [facility] Available via Epic secure chat 7am-7pm After these hours, please refer to coverage provider listed on [email or web address] 04/15/2021, 12:34 PM

## 2021-04-15 NOTE — Plan of Care (Signed)
Pt is alert oriented x 2, pt c/o generalized pain, prn tylenol given per order. PRN atarax given per order. Pt  noted to have colostomy bag falling off. Full of liquid stool, leaked to abdomen and bed. Pt washed off, full linen change. New colostomy bag placed, new posy belt restraint applied. Pt restless and stating shes hungry. Pts late dinner tray given. Pt had entire glucerna shake per order. Pt has turned and reposition self in bed. Covid precautions continued.   ? ? ?Problem: Education: ?Goal: Knowledge of General Education information will improve ?Description: Including pain rating scale, medication(s)/side effects and non-pharmacologic comfort measures ?Outcome: Progressing ?  ?Problem: Health Behavior/Discharge Planning: ?Goal: Ability to manage health-related needs will improve ?Outcome: Progressing ?  ?Problem: Clinical Measurements: ?Goal: Ability to maintain clinical measurements within normal limits will improve ?Outcome: Progressing ?Goal: Will remain free from infection ?Outcome: Progressing ?Goal: Diagnostic test results will improve ?Outcome: Progressing ?Goal: Respiratory complications will improve ?Outcome: Progressing ?Goal: Cardiovascular complication will be avoided ?Outcome: Progressing ?  ?Problem: Activity: ?Goal: Risk for activity intolerance will decrease ?Outcome: Progressing ?  ?Problem: Nutrition: ?Goal: Adequate nutrition will be maintained ?Outcome: Progressing ?  ?Problem: Coping: ?Goal: Level of anxiety will decrease ?Outcome: Progressing ?  ?Problem: Elimination: ?Goal: Will not experience complications related to bowel motility ?Outcome: Progressing ?Goal: Will not experience complications related to urinary retention ?Outcome: Progressing ?  ?Problem: Pain Managment: ?Goal: General experience of comfort will improve ?Outcome: Progressing ?  ?Problem: Safety: ?Goal: Ability to remain free from injury will improve ?Outcome: Progressing ?  ?Problem: Skin Integrity: ?Goal: Risk for  impaired skin integrity will decrease ?Outcome: Progressing ?  ?Problem: Safety: ?Goal: Non-violent Restraint(s) ?Outcome: Progressing ?  ?Problem: Education: ?Goal: Knowledge of risk factors and measures for prevention of condition will improve ?Outcome: Progressing ?  ?Problem: Coping: ?Goal: Psychosocial and spiritual needs will be supported ?Outcome: Progressing ?  ?Problem: Respiratory: ?Goal: Will maintain a patent airway ?Outcome: Progressing ?Goal: Complications related to the disease process, condition or treatment will be avoided or minimized ?Outcome: Progressing ?  ?

## 2021-04-15 NOTE — TOC Progression Note (Addendum)
Transition of Care (TOC) - Progression Note  ? ? ?Patient Details  ?Name: Brittany [name] ?MRN: [id number] ?Date of Birth: 16-Jul-1960 ? ?Transition of Care (TOC) CM/SW Contact  ?Vila Dory Renold Don, LCSWA ?Phone Number: ?04/15/2021, 1:01 PM ? ?Clinical Narrative:    ?CSW contacted [facility] to confirm pt possible DC on Sunday. CSW was informed that this facility does not take weekend admissions. CSW will follow up Monday for DC planning needs. Pt will be out of restraints for 48 hours by then. ? ?CSW spoke with [place] at [facility] who stated that if pt was ready tomorrow she can DC as long as it has been 48 hours since restraints, CSW will follow up with [place] when pt is medically stable. ? ? ?Expected Discharge Plan: [place] ?Barriers to Discharge: SNF Covid, No SNF bed ? ?Expected Discharge Plan and Services ?Expected Discharge Plan: [place] ?  ?  ?  ?  ?                ?  ?  ?  ?  ?  ?  ?  ?  ?  ?  ? ? ?Social Determinants of Health (SDOH) Interventions ?  ? ?Readmission Risk Interventions ?Readmission Risk Prevention Plan 03/06/2021 02/16/2021  ?Transportation Screening Complete Complete  ?Medication Review Press photographer) Complete Complete  ?PCP or Specialist appointment within 3-5 days of discharge Complete -  ?SW Recovery Care/Counseling Consult - Complete  ?Palliative Care Screening Not Applicable Not Applicable  ?[place] Not Applicable Not Applicable  ?Some recent data might be hidden  ? ? ?

## 2021-04-16 DIAGNOSIS — E875 Hyperkalemia: Secondary | ICD-10-CM | POA: Diagnosis not present

## 2021-04-16 DIAGNOSIS — R509 Fever, unspecified: Secondary | ICD-10-CM

## 2021-04-16 LAB — CBC
HCT: 34.4 % — ABNORMAL LOW (ref 36.0–46.0)
Hemoglobin: 10.6 g/dL — ABNORMAL LOW (ref 12.0–15.0)
MCH: 28.8 pg (ref 26.0–34.0)
MCHC: 30.8 g/dL (ref 30.0–36.0)
MCV: 93.5 fL (ref 80.0–100.0)
Platelets: 515 10*3/uL — ABNORMAL HIGH (ref 150–400)
RBC: 3.68 MIL/uL — ABNORMAL LOW (ref 3.87–5.11)
RDW: 15.2 % (ref 11.5–15.5)
WBC: 11.9 10*3/uL — ABNORMAL HIGH (ref 4.0–10.5)
nRBC: 0 % (ref 0.0–0.2)

## 2021-04-16 LAB — GLUCOSE, CAPILLARY
Glucose-Capillary: 295 mg/dL — ABNORMAL HIGH (ref 70–99)
Glucose-Capillary: 98 mg/dL (ref 70–99)
Glucose-Capillary: 262 mg/dL — ABNORMAL HIGH (ref 70–99)
Glucose-Capillary: 67 mg/dL — ABNORMAL LOW (ref 70–99)
Glucose-Capillary: 69 mg/dL — ABNORMAL LOW (ref 70–99)
Glucose-Capillary: 114 mg/dL — ABNORMAL HIGH (ref 70–99)

## 2021-04-16 LAB — BASIC METABOLIC PANEL
Anion gap: 8 (ref 5–15)
BUN: 33 mg/dL — ABNORMAL HIGH (ref 8–23)
CO2: 19 mmol/L — ABNORMAL LOW (ref 22–32)
Calcium: 8.9 mg/dL (ref 8.9–10.3)
Chloride: 99 mmol/L (ref 98–111)
Creatinine, Ser: 0.85 mg/dL (ref 0.44–1.00)
GFR, Estimated: >60 mL/min (ref >60)
Glucose, Bld: 400 mg/dL — ABNORMAL HIGH (ref 70–99)
Potassium: 6.1 mmol/L — ABNORMAL HIGH (ref 3.5–5.1)
Sodium: 126 mmol/L — ABNORMAL LOW (ref 135–145)

## 2021-04-16 LAB — LACTIC ACID, PLASMA: Lactic Acid, Venous: 1.3 mmol/L (ref 0.5–1.9)

## 2021-04-16 LAB — MAGNESIUM: Magnesium: 2.1 mg/dL (ref 1.7–2.4)

## 2021-04-16 LAB — POTASSIUM
Potassium: 5.6 mmol/L — ABNORMAL HIGH (ref 3.5–5.1)
Potassium: 5.9 mmol/L — ABNORMAL HIGH (ref 3.5–5.1)

## 2021-04-16 MED ORDER — SODIUM ZIRCONIUM CYCLOSILICATE 10 G PO PACK
10.0000 g | PACK | Freq: Once | ORAL | Status: AC
  Administered 2021-04-17: 10 g via ORAL
  Filled 2021-04-16: qty 1

## 2021-04-16 MED ORDER — SODIUM CHLORIDE 0.9 % IV BOLUS
500.0000 mL | Freq: Once | INTRAVENOUS | Status: AC
  Administered 2021-04-16: 500 mL via INTRAVENOUS

## 2021-04-16 MED ORDER — INSULIN GLARGINE-YFGN 100 UNIT/ML ~~LOC~~ SOLN
30.0000 [IU] | Freq: Every day | SUBCUTANEOUS | Status: DC
  Filled 2021-04-16: qty 0.3

## 2021-04-16 MED ORDER — QUETIAPINE FUMARATE 25 MG PO TABS
25.0000 mg | ORAL_TABLET | Freq: Every day | ORAL | Status: DC
  Administered 2021-04-16 – 2021-04-20 (×5): 25 mg via ORAL
  Filled 2021-04-16 (×5): qty 1

## 2021-04-16 MED ORDER — INSULIN GLARGINE-YFGN 100 UNIT/ML ~~LOC~~ SOLN
10.0000 [IU] | Freq: Once | SUBCUTANEOUS | Status: DC
  Filled 2021-04-16 (×2): qty 0.1

## 2021-04-16 MED ORDER — CLONAZEPAM 0.25 MG PO TBDP
0.2500 mg | ORAL_TABLET | Freq: Two times a day (BID) | ORAL | Status: DC
  Administered 2021-04-16 – 2021-04-18 (×6): 0.25 mg via ORAL
  Filled 2021-04-16 (×6): qty 1

## 2021-04-16 MED ORDER — INSULIN GLARGINE-YFGN 100 UNIT/ML ~~LOC~~ SOLN
15.0000 [IU] | Freq: Once | SUBCUTANEOUS | Status: AC
  Administered 2021-04-17: 15 [IU] via SUBCUTANEOUS
  Filled 2021-04-16: qty 0.15

## 2021-04-16 MED ORDER — MELATONIN 3 MG PO TABS
6.0000 mg | ORAL_TABLET | Freq: Every day | ORAL | Status: DC
Start: 2021-04-16 — End: 2021-05-09
  Administered 2021-04-16 – 2021-05-08 (×22): 6 mg via ORAL
  Filled 2021-04-16 (×23): qty 2

## 2021-04-16 NOTE — Progress Notes (Signed)
SBP in 80s, patient asymptomatic. Plan to give fluid bolus.  ? ?She also had a temp of 38.1 C this evening but no other infectious s/s noted. Will get blood cultures and check CBC and lactate, monitor for infectious s/s.  ? ? ?

## 2021-04-16 NOTE — TOC Progression Note (Signed)
Transition of Care (TOC) - Progression Note  ? ? ?Patient Details  ?Name: Brittany [name] ?MRN: [id number] ?Date of Birth: 08-Aug-1960 ? ?Transition of Care (TOC) CM/SW Contact  ?Kroy Sprung Renold Don, LCSWA ?Phone Number: ?04/16/2021, 9:57 AM ? ?Clinical Narrative:    ?Pt was put in restraints overnight and is no longer ready for DC. Pt Josem Kaufmann has been approved and the next review date is 04/19/2021. Pt will need to wait another 48 hours after out of restraints before [facility] pt. ? ? ?Expected Discharge Plan: [place] ?Barriers to Discharge: SNF Covid, No SNF bed ? ?Expected Discharge Plan and Services ?Expected Discharge Plan: [place] ?  ?  ?  ?  ?                ?  ?  ?  ?  ?  ?  ?  ?  ?  ?  ? ? ?Social Determinants of Health (SDOH) Interventions ?  ? ?Readmission Risk Interventions ?Readmission Risk Prevention Plan 03/06/2021 02/16/2021  ?Transportation Screening Complete Complete  ?Medication Review Press photographer) Complete Complete  ?PCP or Specialist appointment within 3-5 days of discharge Complete -  ?SW Recovery Care/Counseling Consult - Complete  ?Palliative Care Screening Not Applicable Not Applicable  ?[place] Not Applicable Not Applicable  ?Some recent data might be hidden  ? ? ?

## 2021-04-16 NOTE — Assessment & Plan Note (Addendum)
Benazepril discontinued.  Now potassium improved. Discontinued Lokelma --BMP in am

## 2021-04-16 NOTE — Progress Notes (Signed)
Pt agitated at the start of shift, screaming for random people.  This RN attempted to redirect patient.  Patient told this RN "go fuck yourself, bitch, I'm leaving".  This RN attempted to reorient patient and offered PO medication to patient, pt refused and said "I can do whatever the fuck I want."  This RN then medicated pt with PRN ativan per MAR.  Pt may or may not have also called 911, pt informed staff that she did, but unable to confirm.  Pt more relaxed now and asking for "a joint, a dr pepper, and a cheeseburger".  Will continue to monitor.   ?

## 2021-04-16 NOTE — Progress Notes (Addendum)
PROGRESS NOTE    Brittany [name]  DTO:[id number] DOB: 1960-07-20 DOA: 04/05/2021 PCP: [facility]    Brief Narrative:  Brittany [name] is a 61 year old female with past medical history significant for hypertension, diabetes mellitus, CVA with right-sided weakness, COPD, tobacco abuse, and status post pancreatectomy and splenectomy, anxiety/depression, discoid lupus erythematosus, hyperlipidemia who presented to [facility] ED on 2/22 via EMS with confusion.    Per her daughter at baseline, patient is oriented x 3. She has a history of stroke with right-sided weakness. Normally pt can use a cane to walk. This AM, pt was found to be on the floor with confusion. Pt has jerking and slurred speech per her daughter.    Per EDP, EMS reported that the house was extremely filthy. The patient appeared to be in a soiled diaper. Pt has an colostomy with no collection bag in place. Daughter did not know which medications the patient was on.  EMS states they witnessed what could be a partial seizure, and gave the patient a total of 4 mg of intranasal Versed in route to the hospital. When I saw pt in ED. Pt is confused, not orientated x3. She has right-sided weakness which is likely from previous stroke.  No active cough, respiratory distress noted.  No active nausea and vomiting noted.  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 Pt does not seem to have chest pain.  Not sure if patient has symptoms of UTI.  Her daughter is not sure if patient has a history of seizure.  Daughter states that she found a Keppra bottle which is about 62 years old, not sure if patient is taking his medications or not.   In the ED, glucose 460, bicarbonate 18, anion gap 22, WBC 10.9, INR 1.0, troponin level 21, pending beta hydroxybutyric acid, pending urinalysis, positive COVID PCR, potassium 3.4, GFR>60, temperature normal, blood pressure 149/94, heart rate 99, RR 18, oxygen saturation 98% on room  air.  ABG with pH 7.36, CO2 36, O2 52. Patient is admitted to stepdown as inpatient. Patient was noted to have DKA, subclinical seizure on EEG and incidental finding of COVID-19 viral infection.  Neurology was consulted and patient was transferred to [facility] for continuous EEG.  Admitted to the hospital service.      Assessment & Plan:   Assessment and Plan: * New onset seizure ([place]) Suspect underlying etiology is previous CVA due to lowered seizure threshold made worse by DKA/COVID.  Neurology was consulted and followed during initial hospital course.  --Keppra 500 mg p.o. twice daily --Outpatient follow-up with neurology in 2 to 3 months.  Acute metabolic encephalopathy Improved since admission, but seems to be waxing and waning.  Likely complicated by hospital-acquired delirium and fluctuations in glucose due to noncompliance with diet.  Working with physical and Occupational Therapy.  Will need to discharge to skilled nursing for rehab and then may need to live with her sister as she is likely unable to manage her ADLs at home alone.  DKA (diabetic ketoacidosis) ([name]) Patient presenting with confusion, found to have elevated blood glucose of 460 with an anion gap of 22 on admission.  A1c 15.0 correlating with extremely poor control outpatient.  Etiology likely secondary to medication noncompliance outpatient with her insulin.  She was initially started on insulin drip and transitioned to subcutaneous insulin.  Diabetic educator following. --Continue diabetic control as below.  Type 2 diabetes mellitus with hyperlipidemia (HCC) Hemoglobin A1c 15.0,  poorly controlled.  Suspect noncompliance outpatient and dietary discretions while inpatient. --Diabetic educator following, appreciate assistance --Semglee 30u [place] daily --Novolog 8u TIDAC --SSI for coverage --CBGs qAC/HS --Continue monitor blood sugars closely and continue to adjust  Essential hypertension --Benazepril  discontinued due to hyperkalemia  Hypokalemia Repleted during the hospitalization.  Continue to monitor intermittently.  Restless leg syndrome --Ropinirole 0.5 mg p.o. qHS  Elevated troponin Etiology likely secondary to type II demand ischemia in the setting of DKA and seizure as above.  EKG with no concerning dynamic changes.  No concerning arrhythmias noted on telemetry, now discontinued.    History of stroke --Continue statin --Not on aspirin/Plavix outpatient  Hypomagnesemia Repleted during hospitalization.  Continue to monitor intermittently.  COVID-19 virus infection Incidental finding with positive PCR on 04/05/2021.  Completed 10 days of airborne/contact isolation, now discontinued on 3/5  Status post colostomy ([place]) --Wound care consulted for continued colostomy recommendations while inpatient  COPD, mild ([name]) Stable.  Currently on room air --Albuterol MDI and 1-2 puffs every 4 hours.  Shortness of breath/wheezing  Protein-calorie malnutrition, severe Nutrition Status: Nutrition Problem: Severe Malnutrition Etiology: chronic illness (cancer, lupus) Signs/Symptoms: severe muscle depletion, severe fat depletion, percent weight loss Percent weight loss: 16.5 % (x5 months) Seen by nutrition.  Regular diet with thin liquids.  Continue to encourage increased supplementation.  Pressure injury of skin Continue local wound care, offloading.  Hyperkalemia Potassium 6.1 this morning, repeat 5.6.  Etiology likely secondary to poorly controlled glucose with glucose 400 this morning versus benazepril use. --Adjusting insulin regimen as above --Discontinue benazepril --Repeat BMP in a.m.  Behavior related to cognitive impairment --Seroquel '25mg'$  BID --clonazepam 0.'25mg'$  BID --melatonin '6mg'$  PO qHS    DVT prophylaxis:   Lovenox   Code Status: Full Code Family Communication: No family present at bedside this morning  Disposition Plan:  Level of care: Med-Surg Status is:  Inpatient Remains inpatient appropriate because: Pending SNF placement     Consultants:  Neurology - signed off 2/27  Procedures:  EEG  Antimicrobials:  None   Subjective: Patient seen examined bedside, resting comfortably.  Eating breakfast.  Asking if she can have another breakfast tray.  RN present at bedside.  Patient was replaced in restraints overnight due to pulling at lines pulling off her colostomy bag and attempting to get out of bed./Precautions discontinued this morning.  Patient remains pleasantly confused with lapbelt and wrist restraints in place.  Blood sugars remain labile control due to her indiscretions with diet.  No other specific questions or concerns this morning.  Awaiting SNF placement, but needs to be out of restraints for 48 hours. Denies headache, no chest pain, shortness of breath, no abdominal pain.  No acute events overnight per nursing staff.  Objective: Vitals:   04/15/21 1503 04/15/21 2316 04/16/21 0310 04/16/21 0914  BP: 101/68 102/68 (!) 103/56 (!) 119/91  Pulse:  99 83 (!) 102  Resp:  16 16   Temp: 98.7 F (37.1 C) 98.2 F (36.8 C) 98.1 F (36.7 C) 97.6 F (36.4 C)  TempSrc: Oral Oral Axillary Oral  SpO2:  99% 99% 100%  Weight:      Height:        Intake/Output Summary (Last 24 hours) at 04/16/2021 1203 Last data filed at 04/16/2021 0700 Gross per 24 hour  Intake 837 ml  Output 1100 ml  Net -263 ml    Filed Weights   04/08/21 0413 04/09/21 0500 04/13/21 0436  Weight: 42.4 kg 43.6 kg 43 kg  Examination:  Physical Exam: GEN: NAD, alert, pleasantly confused, thin/cachectic in appearance, appears older than stated age HEENT: NCAT, PERRL, EOMI, sclera clear, MMM PULM: CTAB w/o wheezes/crackles, normal respiratory effort, on room air CV: RRR w/o M/G/R GI: abd soft, NTND, NABS, no R/G/M MSK: no peripheral edema, muscle strength globally intact 5/5 bilateral upper/lower extremities NEURO: CN II-XII intact, no focal deficits,  sensation to light touch intact PSYCH: normal mood/affect Integumentary: Stage II wounds noted left buttock likely from excoriation    Data Reviewed: I have personally reviewed following labs and imaging studies  CBC: Recent Labs  Lab 04/14/21 0543  WBC 11.4*  HGB 11.7*  HCT 35.3*  MCV 89.1  PLT 423   Basic Metabolic Panel: Recent Labs  Lab 04/10/21 0229 04/12/21 0041 04/12/21 0626 04/14/21 0543 04/16/21 0352 04/16/21 0753  NA 137  --   --  132* 126*  --   K 4.2  --   --  5.5* 6.1* 5.6*  CL 101  --   --  100 99  --   CO2 29  --   --  23 19*  --   GLUCOSE 167* 346*  --  82 400*  --   BUN 8  --   --  19 33*  --   CREATININE 0.57  --  0.71 0.62 0.85  --   CALCIUM 8.6*  --   --  9.1 8.9  --   MG  --   --   --  1.8 2.1  --    GFR: Estimated Creatinine Clearance: 47.2 mL/min (by C-G formula based on SCr of 0.85 mg/dL). Liver Function Tests: Recent Labs  Lab 04/10/21 0229  AST 28  ALT 29  ALKPHOS 125  BILITOT 0.3  PROT 6.0*  ALBUMIN 2.1*   No results for input(s): LIPASE, AMYLASE in the last 168 hours. No results for input(s): AMMONIA in the last 168 hours.  Coagulation Profile: No results for input(s): INR, PROTIME in the last 168 hours. Cardiac Enzymes: No results for input(s): CKTOTAL, CKMB, CKMBINDEX, TROPONINI in the last 168 hours. BNP (last 3 results) No results for input(s): PROBNP in the last 8760 hours. HbA1C: No results for input(s): HGBA1C in the last 72 hours. CBG: Recent Labs  Lab 04/15/21 0649 04/15/21 1400 04/15/21 1742 04/15/21 2025 04/16/21 0653  GLUCAP 285* 194* 319* 244* 295*   Lipid Profile: No results for input(s): CHOL, HDL, LDLCALC, TRIG, CHOLHDL, LDLDIRECT in the last 72 hours. Thyroid Function Tests: No results for input(s): TSH, T4TOTAL, FREET4, T3FREE, THYROIDAB in the last 72 hours. Anemia Panel: No results for input(s): VITAMINB12, FOLATE, FERRITIN, TIBC, IRON, RETICCTPCT in the last 72 hours. Sepsis Labs: No results  for input(s): PROCALCITON, LATICACIDVEN in the last 168 hours.  No results found for this or any previous visit (from the past 240 hour(s)).        Radiology Studies: No results found.      Scheduled Meds:  atorvastatin  40 mg Oral QHS   clonazePAM  0.25 mg Oral BID   enoxaparin (LOVENOX) injection  20 mg Subcutaneous Q24H   feeding supplement (GLUCERNA SHAKE)  237 mL Oral TID BM   Gerhardt's butt cream   Topical QID   insulin aspart  0-5 Units Subcutaneous QHS   insulin aspart  0-9 Units Subcutaneous TID WC   insulin aspart  8 Units Subcutaneous TID WC   insulin glargine-yfgn  10 Units Subcutaneous Once   insulin glargine-yfgn  30 Units Subcutaneous QHS  levETIRAcetam  500 mg Oral BID   lipase/protease/amylase  72,000 Units Oral TID with meals   melatonin  6 mg Oral QHS   multivitamin with minerals  1 tablet Oral Daily   QUEtiapine  25 mg Oral QHS   QUEtiapine  25 mg Oral Q breakfast   rOPINIRole  0.5 mg Oral QHS   thiamine  100 mg Oral Daily   Continuous Infusions:   LOS: 11 days    Time spent: 51 minutes spent on chart review, discussion with nursing staff, consultants, updating family and interview/physical exam; more than 50% of that time was spent in counseling and/or coordination of care.    Diva Lemberger J [place] DO [facility] Available via Epic secure chat 7am-7pm After these hours, please refer to coverage provider listed on [email or web address] 04/16/2021, 12:03 PM

## 2021-04-16 NOTE — Progress Notes (Signed)
Pt has been restless, scratching all over. Pt has pulled off colostomy bag off two times. Stool noted on wounds to abdomen. Wounds cleaned, pt also noted constantly scratching at buttocks and perineal area. Pt has cause bleedings to buttocks. Pts nails cleaned and skin tissue and blood noted under nails. PRN atarax has been given, not effective. Provider paged and informed received x 1 order for benadryl for itching, no effective. Provider paged again for order for wrist restraints. Pt has worn mittens which have not been effective as pt will chew them off an pt continues to scratch and cause bleeding. Restraint applied, pt noted still trying to scratch buttocks. Education provided. Pt confused, telesitter in place. Pt offered snack and drink.   ?

## 2021-04-16 NOTE — Plan of Care (Signed)
Pt is alert oriented x 3 this morning, pt had difficult night. Pt was agitated, restless, crying. Pt had intense itching all over and especially to wounds to buttocks. Pt also noted scratching at ostomy area and pulling off ostomy from the base. Pt has open wounds to abdomen and buttocks. Pt was cleaned multiple times an washed down and dressings reapplied. Restraints to wrist had to be applied per order due to patient scratching at open bleeding wounds and removing ostomy. Stool noted in wounds . Wounds cleansed. Pt educated on need for wrist restraints. Education provided of risk for infection from pulling off ostomy and stool going in wounds. Pt stated she could not help but scratch as she is so itchy and cant keep herself from scratching. Pt continued to scratch at areas. PRN atarax, lotion, ativan for agitation , not effective. X1 order for benadryl given, no effective results. Pt was cursing at staff, demanding to get up. Pt was given a bath this am. Sat patient on the side of bed as staff washed her and changed sheets again. Pt then stood up and ambulated with +2 assist in room. Pt ambulated to sink where she brushed her teeth, washed her private areas. Pt is requesting a mirror to look at her own wounds. Pt informed there was not one available at this time. Pt ambulated back to bed with +2 assistance. Pt back in bed with restraints in place, pt is calm and resting at this time.  ? ? ?Problem: Education: ?Goal: Knowledge of General Education information will improve ?Description: Including pain rating scale, medication(s)/side effects and non-pharmacologic comfort measures ?Outcome: Progressing ?  ?Problem: Health Behavior/Discharge Planning: ?Goal: Ability to manage health-related needs will improve ?Outcome: Progressing ?  ?Problem: Clinical Measurements: ?Goal: Ability to maintain clinical measurements within normal limits will improve ?Outcome: Progressing ?Goal: Will remain free from infection ?Outcome:  Progressing ?Goal: Diagnostic test results will improve ?Outcome: Progressing ?Goal: Respiratory complications will improve ?Outcome: Progressing ?Goal: Cardiovascular complication will be avoided ?Outcome: Progressing ?  ?Problem: Activity: ?Goal: Risk for activity intolerance will decrease ?Outcome: Progressing ?  ?Problem: Nutrition: ?Goal: Adequate nutrition will be maintained ?Outcome: Progressing ?  ?Problem: Coping: ?Goal: Level of anxiety will decrease ?Outcome: Progressing ?  ?Problem: Elimination: ?Goal: Will not experience complications related to bowel motility ?Outcome: Progressing ?Goal: Will not experience complications related to urinary retention ?Outcome: Progressing ?  ?Problem: Pain Managment: ?Goal: General experience of comfort will improve ?Outcome: Progressing ?  ?Problem: Safety: ?Goal: Ability to remain free from injury will improve ?Outcome: Progressing ?  ?Problem: Skin Integrity: ?Goal: Risk for impaired skin integrity will decrease ?Outcome: Progressing ?  ?Problem: Safety: ?Goal: Non-violent Restraint(s) ?Outcome: Progressing ?  ?Problem: Education: ?Goal: Knowledge of risk factors and measures for prevention of condition will improve ?Outcome: Progressing ?  ?Problem: Coping: ?Goal: Psychosocial and spiritual needs will be supported ?Outcome: Progressing ?  ?Problem: Respiratory: ?Goal: Will maintain a patent airway ?Outcome: Progressing ?Goal: Complications related to the disease process, condition or treatment will be avoided or minimized ?Outcome: Progressing ?  ?

## 2021-04-17 LAB — GLUCOSE, CAPILLARY
Glucose-Capillary: 127 mg/dL — ABNORMAL HIGH (ref 70–99)
Glucose-Capillary: 348 mg/dL — ABNORMAL HIGH (ref 70–99)
Glucose-Capillary: 46 mg/dL — ABNORMAL LOW (ref 70–99)
Glucose-Capillary: 336 mg/dL — ABNORMAL HIGH (ref 70–99)
Glucose-Capillary: 418 mg/dL — ABNORMAL HIGH (ref 70–99)

## 2021-04-17 LAB — BASIC METABOLIC PANEL
Anion gap: 6 (ref 5–15)
Anion gap: 8 (ref 5–15)
BUN: 30 mg/dL — ABNORMAL HIGH (ref 8–23)
BUN: 29 mg/dL — ABNORMAL HIGH (ref 8–23)
CO2: 20 mmol/L — ABNORMAL LOW (ref 22–32)
CO2: 15 mmol/L — ABNORMAL LOW (ref 22–32)
Calcium: 8.9 mg/dL (ref 8.9–10.3)
Calcium: 8.3 mg/dL — ABNORMAL LOW (ref 8.9–10.3)
Chloride: 106 mmol/L (ref 98–111)
Chloride: 105 mmol/L (ref 98–111)
Creatinine, Ser: 0.79 mg/dL (ref 0.44–1.00)
Creatinine, Ser: 0.72 mg/dL (ref 0.44–1.00)
GFR, Estimated: >60 mL/min (ref >60)
GFR, Estimated: >60 mL/min (ref >60)
Glucose, Bld: 231 mg/dL — ABNORMAL HIGH (ref 70–99)
Glucose, Bld: 440 mg/dL — ABNORMAL HIGH (ref 70–99)
Potassium: 5.7 mmol/L — ABNORMAL HIGH (ref 3.5–5.1)
Potassium: >7.5 mmol/L (ref 3.5–5.1)
Sodium: 132 mmol/L — ABNORMAL LOW (ref 135–145)
Sodium: 128 mmol/L — ABNORMAL LOW (ref 135–145)

## 2021-04-17 LAB — POTASSIUM: Potassium: 6.7 mmol/L (ref 3.5–5.1)

## 2021-04-17 MED ORDER — INSULIN GLARGINE-YFGN 100 UNIT/ML ~~LOC~~ SOLN
20.0000 [IU] | Freq: Every day | SUBCUTANEOUS | Status: DC
  Administered 2021-04-17: 20 [IU] via SUBCUTANEOUS
  Filled 2021-04-17 (×2): qty 0.2

## 2021-04-17 MED ORDER — HYDROXYZINE HCL 25 MG PO TABS
50.0000 mg | ORAL_TABLET | Freq: Four times a day (QID) | ORAL | Status: DC | PRN
  Administered 2021-04-17 – 2021-04-30 (×11): 50 mg via ORAL
  Filled 2021-04-17 (×12): qty 2

## 2021-04-17 MED ORDER — INSULIN GLARGINE-YFGN 100 UNIT/ML ~~LOC~~ SOLN
20.0000 [IU] | Freq: Every day | SUBCUTANEOUS | Status: DC
Start: 2021-04-17 — End: 2021-04-17
  Filled 2021-04-17: qty 0.2

## 2021-04-17 MED ORDER — SODIUM CHLORIDE 0.9 % IV BOLUS
1000.0000 mL | Freq: Once | INTRAVENOUS | Status: AC
  Administered 2021-04-17: 1000 mL via INTRAVENOUS

## 2021-04-17 MED ORDER — SODIUM ZIRCONIUM CYCLOSILICATE 10 G PO PACK
10.0000 g | PACK | Freq: Two times a day (BID) | ORAL | Status: DC
  Administered 2021-04-17 – 2021-04-19 (×5): 10 g via ORAL
  Filled 2021-04-17 (×5): qty 1

## 2021-04-17 MED ORDER — SODIUM CHLORIDE 0.9 % IV SOLN: INTRAVENOUS | Status: DC

## 2021-04-17 NOTE — Progress Notes (Signed)
Critical lab of potassium >7.5 called on the patient. MD paged to notified. MD called back and asked for the sample to re recollected. The result said the other sample was hemolyzed. Will ask for sample to be recollected. ?

## 2021-04-17 NOTE — Progress Notes (Signed)
Nutrition Follow-up ? ?DOCUMENTATION CODES:  ?Severe malnutrition in context of chronic illness, Underweight ? ?INTERVENTION:  ?-continue Glucerna Shake po TID, each supplement provides 220 kcal and 10 grams of protein, 16 grams carbohydrate ?-continue MVI with minerals daily ? ?NUTRITION DIAGNOSIS:  ?Severe Malnutrition related to chronic illness (cancer, lupus) as evidenced by severe muscle depletion, severe fat depletion, percent weight loss. -- ongoing ? ?GOAL:  ?Patient will meet greater than or equal to 90% of their needs -- progressing ? ?MONITOR:  ?Diet advancement, Labs, Weight trends, Skin, I & O's ? ?REASON FOR ASSESSMENT:  ?Malnutrition Screening Tool ?  ? ?ASSESSMENT:  ?Pt with PMH significant for DM, HTN, lupus, Ca, CVA w/ R-sided weakness, COPD, and s/p pancreatectomy and splenectomy admitted with acute metabolic encephalopathy and DKA. EEG noted subclinical seizure. ? ?2/24 - Cortrak placed (gastric tip confirmed via xray) ?2/25 - diet advanced to full liquids ?2/26 - diet advanced to dysphagia 2 with thin liquids ?2/27 - cortrak removed ?3/01 - diet advanced to regular texture ? ?Pt's appetite has been good since last RD visit. 4 meal completions charted as 50-100% (87.5% avg meal intake). Pt remains pleasantly confused, though is still complaining of intermittent itching. Pt without additional complaints at this time. Per RN, pt continues to do well with Glucerna supplements (ordered TID). Recommend continue current nutrition plan of care  ? ?Medications: SSI TID w/ meals and bedtime, 8 units novolog TID w/ meals, 20 units semglee daily, creon TID w/ meals, thiamine, keppra, mvi with minerals, lokelma ?Labs: Na 132 (L), K+ 5.7 (H) ?CBGs: 67-272 x24 hours - diabetes coordinator following ?A1c (last taken 04/06/21) 15.0 ? ?UOP: 337m x24 hours ?Colostomy output: [id number] hours ?I/O: + 5.4158mince admit ? ?Admit wt 43.6 kg ?Current wt 43 kg ? ?Diet Order:   ?Diet Order   ? ?       ?  Diet Carb  Modified Fluid consistency: Thin; Room service appropriate? Yes with Assist  Diet effective now       ?  ? ?  ?  ? ?  ? ?EDUCATION NEEDS:  ?Not appropriate for education at this time ? ?Skin:  Skin Assessment: Skin Integrity Issues: ?Skin Integrity Issues:: Other (Comment) ?Incisions: abdomen (from previous admit) ?Other: MASD buttocks ? ?Last BM:  3/5 via colostomy ? ?Height:  ?Ht Readings from Last 1 Encounters:  ?04/05/21 5' (1.524 m)  ? ?Weight:  ?Wt Readings from Last 1 Encounters:  ?04/13/21 43 kg  ? ?BMI:  Body mass index is 18.51 kg/m?. ? ?Estimated Nutritional Needs:  ?Kcal:  1400-1600 ?Protein:  70-80 grams ?Fluid:  >1.4L ? ? ?AmaTheone StanleyMS, RD, LDN (she/her/hers) ?RD pager number and weekend/on-call pager number located in [place] ?

## 2021-04-17 NOTE — Plan of Care (Signed)
Ostomy bag changed. Soft wrist restraints removed. IV team called for an IV. RN tried 4 times. Dsg to buttocks changed. Pt wheeled down the hall and outside for fresh air. Pt was with the RN at the nurses station reading the news paper and coloring. Bath given. ? ?Problem: Education: ?Goal: Knowledge of General Education information will improve ?Description: Including pain rating scale, medication(s)/side effects and non-pharmacologic comfort measures ?Outcome: Progressing ?  ?Problem: Health Behavior/Discharge Planning: ?Goal: Ability to manage health-related needs will improve ?Outcome: Progressing ?  ?Problem: Clinical Measurements: ?Goal: Ability to maintain clinical measurements within normal limits will improve ?Outcome: Progressing ?Goal: Will remain free from infection ?Outcome: Progressing ?Goal: Diagnostic test results will improve ?Outcome: Progressing ?Goal: Respiratory complications will improve ?Outcome: Progressing ?Goal: Cardiovascular complication will be avoided ?Outcome: Progressing ?  ?Problem: Activity: ?Goal: Risk for activity intolerance will decrease ?Outcome: Progressing ?  ?Problem: Nutrition: ?Goal: Adequate nutrition will be maintained ?Outcome: Progressing ?  ?Problem: Coping: ?Goal: Level of anxiety will decrease ?Outcome: Progressing ?  ?Problem: Elimination: ?Goal: Will not experience complications related to bowel motility ?Outcome: Progressing ?Goal: Will not experience complications related to urinary retention ?Outcome: Progressing ?  ?Problem: Pain Managment: ?Goal: General experience of comfort will improve ?Outcome: Progressing ?  ?Problem: Safety: ?Goal: Ability to remain free from injury will improve ?Outcome: Progressing ?  ?Problem: Skin Integrity: ?Goal: Risk for impaired skin integrity will decrease ?Outcome: Progressing ?  ?Problem: Safety: ?Goal: Non-violent Restraint(s) ?Outcome: Progressing ?  ?Problem: Education: ?Goal: Knowledge of risk factors and measures for  prevention of condition will improve ?Outcome: Progressing ?  ?Problem: Coping: ?Goal: Psychosocial and spiritual needs will be supported ?Outcome: Progressing ?  ?Problem: Respiratory: ?Goal: Will maintain a patent airway ?Outcome: Progressing ?Goal: Complications related to the disease process, condition or treatment will be avoided or minimized ?Outcome: Progressing ?  ?

## 2021-04-17 NOTE — Progress Notes (Signed)
PROGRESS NOTE    Brittany [name]  PNT:[id number] DOB: Mar 11, 1960 DOA: 04/05/2021 PCP: [facility], [facility]    Brief Narrative:  Brittany [name] is a 61 year old female with past medical history significant for hypertension, diabetes mellitus, CVA with right-sided weakness, COPD, tobacco abuse, and status post pancreatectomy and splenectomy, anxiety/depression, discoid lupus erythematosus, hyperlipidemia who presented to [facility] ED on 2/22 via EMS with confusion.    Per her daughter at baseline, patient is oriented x 3. She has a history of stroke with right-sided weakness. Normally pt can use a cane to walk. This AM, pt was found to be on the floor with confusion. Pt has jerking and slurred speech per her daughter.    Per EDP, EMS reported that the house was extremely filthy. The patient appeared to be in a soiled diaper. Pt has an colostomy with no collection bag in place. Daughter did not know which medications the patient was on.  EMS states they witnessed what could be a partial seizure, and gave the patient a total of 4 mg of intranasal Versed in route to the hospital. When I saw pt in ED. Pt is confused, not orientated x3. She has right-sided weakness which is likely from previous stroke.  No active cough, respiratory distress noted.  No active nausea and vomiting noted.  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 Pt does not seem to have chest pain.  Not sure if patient has symptoms of UTI.  Her daughter is not sure if patient has a history of seizure.  Daughter states that she found a Keppra bottle which is about 62 years old, not sure if patient is taking his medications or not.   In the ED, glucose 460, bicarbonate 18, anion gap 22, WBC 10.9, INR 1.0, troponin level 21, pending beta hydroxybutyric acid, pending urinalysis, positive COVID PCR, potassium 3.4, GFR>60, temperature normal, blood pressure 149/94, heart rate 99, RR 18, oxygen saturation 98% on room  air.  ABG with pH 7.36, CO2 36, O2 52. Patient is admitted to stepdown as inpatient. Patient was noted to have DKA, subclinical seizure on EEG and incidental finding of COVID-19 viral infection.  Neurology was consulted and patient was transferred to [facility] for continuous EEG.  Admitted to the hospital service.      Assessment & Plan:   Assessment and Plan: * New onset seizure ([place]) Suspect underlying etiology is previous CVA due to lowered seizure threshold made worse by DKA/COVID.  Neurology was consulted and followed during initial hospital course.  --Keppra 500 mg p.o. twice daily --Outpatient follow-up with neurology in 2 to 3 months.  Acute metabolic encephalopathy Improved since admission, but seems to be waxing and waning.  Likely complicated by hospital-acquired delirium and fluctuations in glucose due to noncompliance with diet.  Working with physical and Occupational Therapy.  Will need to discharge to skilled nursing for rehab and then may need to live with her sister as she is likely unable to manage her ADLs at home alone.  DKA (diabetic ketoacidosis) ([place]) Patient presenting with confusion, found to have elevated blood glucose of 460 with an anion gap of 22 on admission.  A1c 15.0 correlating with extremely poor control outpatient.  Etiology likely secondary to medication noncompliance outpatient with her insulin.  She was initially started on insulin drip and transitioned to subcutaneous insulin.  Diabetic educator following. --Continue diabetic control as below.  Type 2 diabetes mellitus with hyperlipidemia (HCC) Hemoglobin A1c 15.0,  poorly controlled.  Suspect noncompliance outpatient and dietary discretions while inpatient. --Diabetic educator following, appreciate assistance --Semglee 20u [place] daily --Novolog 8u TIDAC --SSI for coverage --CBGs qAC/HS --Continue monitor blood sugars closely and continue to adjust  Essential hypertension --Benazepril  discontinued due to hyperkalemia  Hypokalemia; hyperkalemia Initially repleted during this hospitalization.  Now potassium level elevated, 5.7 this morning. -- Benazepril discontinued; on no other medications that would cause elevated potassium -- Lokelma 10 g p.o. twice daily -- IV fluid hydration -- Repeat BMP in the a.m.  Restless leg syndrome --Ropinirole 0.5 mg p.o. qHS  Elevated troponin Etiology likely secondary to type II demand ischemia in the setting of DKA and seizure as above.  EKG with no concerning dynamic changes.  No concerning arrhythmias noted on telemetry, now discontinued.    History of stroke --Continue statin --Not on aspirin/Plavix outpatient  Hypomagnesemia Repleted during hospitalization.  Continue to monitor intermittently.  COVID-19 virus infection Incidental finding with positive PCR on 04/05/2021.  Completed 10 days of airborne/contact isolation, now discontinued on 3/5  Status post colostomy ([place]) --Wound care consulted for continued colostomy recommendations while inpatient  COPD, mild ([place]) Stable.  Currently on room air --Albuterol MDI and 1-2 puffs every 4 hours.  Shortness of breath/wheezing  Protein-calorie malnutrition, severe Nutrition Status: Nutrition Problem: Severe Malnutrition Etiology: chronic illness (cancer, lupus) Signs/Symptoms: severe muscle depletion, severe fat depletion, percent weight loss Percent weight loss: 16.5 % (x5 months) Seen by nutrition.  Regular diet with thin liquids.  Continue to encourage increased supplementation.  Pressure injury of skin Continue local wound care, offloading.  Hyperkalemia Potassium 5.7 this morning, benazepril has been discontinued.  Unclear why her potassium remains elevated, no reported hemolysis on lab.  No other medications noted that would be causing elevated potassium level noted. --Lokelma 10 g p.o. twice daily --IV fluid hydration --Repeat BMP in a.m.  Behavior related to  cognitive impairment --Seroquel '25mg'$  BID --clonazepam 0.'25mg'$  BID --melatonin '6mg'$  PO qHS    DVT prophylaxis:   Lovenox   Code Status: Full Code Family Communication: No family present at bedside this morning  Disposition Plan:  Level of care: Med-Surg Status is: Inpatient Remains inpatient appropriate because: Pending SNF placement     Consultants:  Neurology - signed off 2/27  Procedures:  EEG  Antimicrobials:  None   Subjective: Patient seen examined bedside, lying in bed with restraints in place with tele-monitor.  Continues to complain of itching, wants to take a bath.  Overnight with hypotension, low-grade fever 100.5.  Was given 1 L NS bolus and Lokelma for elevated potassium.  No infectious etiology found and holding on antibiotics at this time.  Now afebrile.  Remains pleasantly confused, continues to attempt to get out of bed and high fall risk.  No other questions or concerns at this time.  Denies headache, no chest pain, no shortness of breath, no abdominal pain.  No other acute events overnight per nursing staff.  Objective: Vitals:   04/16/21 2315 04/17/21 0305 04/17/21 0845 04/17/21 1125  BP: 104/60 108/81 104/68 124/83  Pulse: 96 84 94 77  Resp: '16 16 18 18  '$ Temp: 97.7 F (36.5 C) (!) 97.5 F (36.4 C) 97.7 F (36.5 C) 98.2 F (36.8 C)  TempSrc: Oral Oral Oral   SpO2: 100% 100% 100% 100%  Weight:      Height:        Intake/Output Summary (Last 24 hours) at 04/17/2021 1300 Last data filed at 04/17/2021 1039 Gross per 24  hour  Intake 1000 ml  Output 1950 ml  Net -950 ml    Filed Weights   04/08/21 0413 04/09/21 0500 04/13/21 0436  Weight: 42.4 kg 43.6 kg 43 kg    Examination:  Physical Exam: GEN: NAD, alert, pleasantly confused, oriented to place [facility]), but not time  (2022), or person (president Raegan), or situation. thin/cachectic in appearance, appears older than stated age 59: NCAT, PERRL, EOMI, sclera clear, MMM PULM: CTAB w/o  wheezes/crackles, normal respiratory effort, on room air CV: RRR w/o M/G/R GI: abd soft, NTND, NABS, no R/G/M, colostomy bag noted with soft brown stool in collection bag MSK: no peripheral edema, muscle strength globally intact 5/5 bilateral upper/lower extremities NEURO: CN II-XII intact, no focal deficits, sensation to light touch intact PSYCH: normal mood/affect Integumentary: Stage II wounds noted left buttock likely from excoriation    Data Reviewed: I have personally reviewed following labs and imaging studies  CBC: Recent Labs  Lab 04/14/21 0543 04/16/21 2107  WBC 11.4* 11.9*  HGB 11.7* 10.6*  HCT 35.3* 34.4*  MCV 89.1 93.5  PLT 348 594*   Basic Metabolic Panel: Recent Labs  Lab 04/12/21 0041 04/12/21 0626 04/14/21 0543 04/16/21 0352 04/16/21 0753 04/16/21 2107 04/17/21 0807  NA  --   --  132* 126*  --   --  132*  K  --   --  5.5* 6.1* 5.6* 5.9* 5.7*  CL  --   --  100 99  --   --  106  CO2  --   --  23 19*  --   --  20*  GLUCOSE 346*  --  82 400*  --   --  231*  BUN  --   --  19 33*  --   --  30*  CREATININE  --  0.71 0.62 0.85  --   --  0.79  CALCIUM  --   --  9.1 8.9  --   --  8.9  MG  --   --  1.8 2.1  --   --   --    GFR: Estimated Creatinine Clearance: 50.1 mL/min (by C-G formula based on SCr of 0.79 mg/dL). Liver Function Tests: No results for input(s): AST, ALT, ALKPHOS, BILITOT, PROT, ALBUMIN in the last 168 hours.  No results for input(s): LIPASE, AMYLASE in the last 168 hours. No results for input(s): AMMONIA in the last 168 hours.  Coagulation Profile: No results for input(s): INR, PROTIME in the last 168 hours. Cardiac Enzymes: No results for input(s): CKTOTAL, CKMB, CKMBINDEX, TROPONINI in the last 168 hours. BNP (last 3 results) No results for input(s): PROBNP in the last 8760 hours. HbA1C: No results for input(s): HGBA1C in the last 72 hours. CBG: Recent Labs  Lab 04/16/21 2126 04/16/21 2225 04/16/21 2248 04/17/21 0615  04/17/21 1123  GLUCAP 67* 69* 114* 127* 348*   Lipid Profile: No results for input(s): CHOL, HDL, LDLCALC, TRIG, CHOLHDL, LDLDIRECT in the last 72 hours. Thyroid Function Tests: No results for input(s): TSH, T4TOTAL, FREET4, T3FREE, THYROIDAB in the last 72 hours. Anemia Panel: No results for input(s): VITAMINB12, FOLATE, FERRITIN, TIBC, IRON, RETICCTPCT in the last 72 hours. Sepsis Labs: Recent Labs  Lab 04/16/21 2107  LATICACIDVEN 1.3    Recent Results (from the past 240 hour(s))  Culture, blood (routine x 2)     Status: None (Preliminary result)   Collection Time: 04/16/21  9:26 PM   Specimen: BLOOD RIGHT ARM  Result Value Ref Range  Status   Specimen Description BLOOD RIGHT ARM  Final   Special Requests   Final    BOTTLES DRAWN AEROBIC AND ANAEROBIC Blood Culture results may not be optimal due to an inadequate volume of blood received in culture bottles   Culture   Final    NO GROWTH < 12 HOURS Performed at [facility] [address] [place], [place] [address]    Report Status PENDING  Incomplete  Culture, blood (routine x 2)     Status: None (Preliminary result)   Collection Time: 04/16/21  9:30 PM   Specimen: BLOOD LEFT ARM  Result Value Ref Range Status   Specimen Description BLOOD LEFT ARM  Final   Special Requests   Final    BOTTLES DRAWN AEROBIC AND ANAEROBIC Blood Culture results may not be optimal due to an inadequate volume of blood received in culture bottles   Culture   Final    NO GROWTH < 12 HOURS Performed at [facility], [place] [address] [place], [place] [address]    Report Status PENDING  Incomplete          Radiology Studies: No results found.      Scheduled Meds:  atorvastatin  40 mg Oral QHS   clonazePAM  0.25 mg Oral BID   enoxaparin (LOVENOX) injection  20 mg Subcutaneous Q24H   feeding supplement (GLUCERNA SHAKE)  237 mL Oral TID BM   Gerhardt's butt cream   Topical QID   insulin aspart  0-5 Units Subcutaneous QHS    insulin aspart  0-9 Units Subcutaneous TID WC   insulin aspart  8 Units Subcutaneous TID WC   insulin glargine-yfgn  20 Units Subcutaneous Daily   levETIRAcetam  500 mg Oral BID   lipase/protease/amylase  72,000 Units Oral TID with meals   melatonin  6 mg Oral QHS   multivitamin with minerals  1 tablet Oral Daily   QUEtiapine  25 mg Oral QHS   QUEtiapine  25 mg Oral Q breakfast   rOPINIRole  0.5 mg Oral QHS   sodium zirconium cyclosilicate  10 g Oral BID   thiamine  100 mg Oral Daily   Continuous Infusions:  sodium chloride     Followed by   sodium chloride       LOS: 12 days    Time spent: 55 minutes spent on chart review, discussion with nursing staff, consultants, updating family and interview/physical exam; more than 50% of that time was spent in counseling and/or coordination of care.    Loneta Tamplin J [place] DO [facility] Available via Epic secure chat 7am-7pm After these hours, please refer to coverage provider listed on [email or web address] 04/17/2021, 1:00 PM

## 2021-04-17 NOTE — TOC Progression Note (Signed)
Transition of Care (TOC) - Progression Note  ? ? ?Patient Details  ?Name: Brittany [name] ?MRN: [id number] ?Date of Birth: 1960/10/21 ? ?Transition of Care (TOC) CM/SW Contact  ?[place], LCSW ?Phone Number: ?04/17/2021, 2:08 PM ? ?Clinical Narrative:    ? ?CSW called pt daughter; no answer left voicemail. ? ?Called pt's sister; she stated she would like facility closest to [place]. CSW inquired who is Media planner; sister stated that they discuss as a family. Sister will contact daughter to call CSW back. Sister expresses concerns that pt is itchy and thinks pt needs medicine for itchiness in order to stop pulling on medical lines/equipment.  ? ?Pt's daughter called CSW back promptly. Discussed SNF offers; daughter is agreeable to [facility] but is interested in [place] facilities as all of her outpatient providers are there. CSW agreed that if daughter identifies a [place] facility she wants, CSW would fax a referral. CSW agreed to fax to [facility] as facility is in the hub.  ? ? ?Expected Discharge Plan: [place] ?Barriers to Discharge: SNF Covid, No SNF bed ? ?Expected Discharge Plan and Services ?Expected Discharge Plan: [place] ?  ?  ?  ?  ?                ?  ?  ?  ?  ?  ?  ?  ?  ?  ?  ? ? ?Social Determinants of Health (SDOH) Interventions ?  ? ?Readmission Risk Interventions ?Readmission Risk Prevention Plan 03/06/2021 02/16/2021  ?Transportation Screening Complete Complete  ?Medication Review Press photographer) Complete Complete  ?PCP or Specialist appointment within 3-5 days of discharge Complete -  ?SW Recovery Care/Counseling Consult - Complete  ?Palliative Care Screening Not Applicable Not Applicable  ?[place] Not Applicable Not Applicable  ?Some recent data might be hidden  ? ? ?

## 2021-04-17 NOTE — Care Management Important Message (Signed)
Important Message ? ?Patient Details  ?Name: Brittany [name] ?MRN: [id number] ?Date of Birth: 1960-04-06 ? ? ?Medicare Important Message Given:  Yes ? ? ? ? ?[name] ?04/17/2021, 2:46 PM ?

## 2021-04-17 NOTE — Progress Notes (Signed)
Arrived to patient's room. She is not in here room at this time. Secure chatted nurse to reconsult IV team when patient is back in her room. Fran Lowes, RN VAST ? ?

## 2021-04-18 LAB — CBC
HCT: 29.9 % — ABNORMAL LOW (ref 36.0–46.0)
Hemoglobin: 9.7 g/dL — ABNORMAL LOW (ref 12.0–15.0)
MCH: 29.7 pg (ref 26.0–34.0)
MCHC: 32.4 g/dL (ref 30.0–36.0)
MCV: 91.4 fL (ref 80.0–100.0)
Platelets: 500 10*3/uL — ABNORMAL HIGH (ref 150–400)
RBC: 3.27 MIL/uL — ABNORMAL LOW (ref 3.87–5.11)
RDW: 15 % (ref 11.5–15.5)
WBC: 8.3 10*3/uL (ref 4.0–10.5)
nRBC: 0 % (ref 0.0–0.2)

## 2021-04-18 LAB — COMPREHENSIVE METABOLIC PANEL
ALT: 46 U/L — ABNORMAL HIGH (ref 0–44)
AST: 42 U/L — ABNORMAL HIGH (ref 15–41)
Albumin: 2.2 g/dL — ABNORMAL LOW (ref 3.5–5.0)
Alkaline Phosphatase: 137 U/L — ABNORMAL HIGH (ref 38–126)
Anion gap: 8 (ref 5–15)
BUN: 29 mg/dL — ABNORMAL HIGH (ref 8–23)
CO2: 18 mmol/L — ABNORMAL LOW (ref 22–32)
Calcium: 8.4 mg/dL — ABNORMAL LOW (ref 8.9–10.3)
Chloride: 106 mmol/L (ref 98–111)
Creatinine, Ser: 0.68 mg/dL (ref 0.44–1.00)
GFR, Estimated: >60 mL/min (ref >60)
Glucose, Bld: 394 mg/dL — ABNORMAL HIGH (ref 70–99)
Potassium: 5.3 mmol/L — ABNORMAL HIGH (ref 3.5–5.1)
Sodium: 132 mmol/L — ABNORMAL LOW (ref 135–145)
Total Bilirubin: 0.5 mg/dL (ref 0.3–1.2)
Total Protein: 7 g/dL (ref 6.5–8.1)

## 2021-04-18 LAB — GLUCOSE, CAPILLARY
Glucose-Capillary: 198 mg/dL — ABNORMAL HIGH (ref 70–99)
Glucose-Capillary: 123 mg/dL — ABNORMAL HIGH (ref 70–99)
Glucose-Capillary: 225 mg/dL — ABNORMAL HIGH (ref 70–99)
Glucose-Capillary: 294 mg/dL — ABNORMAL HIGH (ref 70–99)
Glucose-Capillary: 336 mg/dL — ABNORMAL HIGH (ref 70–99)
Glucose-Capillary: 271 mg/dL — ABNORMAL HIGH (ref 70–99)

## 2021-04-18 LAB — MAGNESIUM: Magnesium: 1.8 mg/dL (ref 1.7–2.4)

## 2021-04-18 LAB — PHOSPHORUS: Phosphorus: 3.9 mg/dL (ref 2.5–4.6)

## 2021-04-18 MED ORDER — QUETIAPINE FUMARATE 50 MG PO TABS
50.0000 mg | ORAL_TABLET | Freq: Every day | ORAL | Status: DC
Start: 2021-04-18 — End: 2021-04-20
  Administered 2021-04-18 – 2021-04-19 (×2): 50 mg via ORAL
  Filled 2021-04-18 (×3): qty 1

## 2021-04-18 MED ORDER — INSULIN GLARGINE-YFGN 100 UNIT/ML ~~LOC~~ SOLN
25.0000 [IU] | Freq: Every day | SUBCUTANEOUS | Status: DC
  Administered 2021-04-18 – 2021-04-19 (×2): 25 [IU] via SUBCUTANEOUS
  Filled 2021-04-18 (×3): qty 0.25

## 2021-04-18 MED ORDER — INSULIN ASPART 100 UNIT/ML IJ SOLN
4.0000 [IU] | Freq: Three times a day (TID) | INTRAMUSCULAR | Status: DC
  Administered 2021-04-18 – 2021-04-19 (×3): 4 [IU] via SUBCUTANEOUS

## 2021-04-18 NOTE — Progress Notes (Signed)
PROGRESS NOTE    Brittany [name]  YQM:[id number] DOB: 1960-12-28 DOA: 04/05/2021 PCP: Clinic, [facility]    Brief Narrative:  Brittany [name] is a 61 year old female with past medical history significant for hypertension, diabetes mellitus, CVA with right-sided weakness, COPD, tobacco abuse, and status post pancreatectomy and splenectomy, anxiety/depression, discoid lupus erythematosus, hyperlipidemia who presented to [facility] ED on 2/22 via EMS with confusion.    Per her daughter at baseline, patient is oriented x 3. She has a history of stroke with right-sided weakness. Normally pt can use a cane to walk. This AM, pt was found to be on the floor with confusion. Pt has jerking and slurred speech per her daughter.    Per EDP, EMS reported that the house was extremely filthy. The patient appeared to be in a soiled diaper. Pt has an colostomy with no collection bag in place. Daughter did not know which medications the patient was on.  EMS states they witnessed what could be a partial seizure, and gave the patient a total of 4 mg of intranasal Versed in route to the hospital. When I saw pt in ED. Pt is confused, not orientated x3. She has right-sided weakness which is likely from previous stroke.  No active cough, respiratory distress noted.  No active nausea and vomiting noted.  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 Pt does not seem to have chest pain.  Not sure if patient has symptoms of UTI.  Her daughter is not sure if patient has a history of seizure.  Daughter states that she found a Keppra bottle which is about 62 years old, not sure if patient is taking his medications or not.   In the ED, glucose 460, bicarbonate 18, anion gap 22, WBC 10.9, INR 1.0, troponin level 21, pending beta hydroxybutyric acid, pending urinalysis, positive COVID PCR, potassium 3.4, GFR>60, temperature normal, blood pressure 149/94, heart rate 99, RR 18, oxygen saturation 98% on room  air.  ABG with pH 7.36, CO2 36, O2 52. Patient is admitted to stepdown as inpatient. Patient was noted to have DKA, subclinical seizure on EEG and incidental finding of COVID-19 viral infection.  Neurology was consulted and patient was transferred to [facility] for continuous EEG.  Admitted to the hospital service.      Assessment & Plan:   Assessment and Plan: * New onset seizure ([place]) Suspect underlying etiology is previous CVA due to lowered seizure threshold made worse by DKA/COVID.  Neurology was consulted and followed during initial hospital course.  --Keppra 500 mg p.o. twice daily --Outpatient follow-up with neurology in 2 to 3 months.  Acute metabolic encephalopathy Improved since admission, but seems to be waxing and waning.  Likely complicated by hospital-acquired delirium and fluctuations in glucose due to noncompliance with diet.  Working with physical and Occupational Therapy.  Will need to discharge to skilled nursing for rehab and then may need to live with her sister as she is likely unable to manage her ADLs at home alone.  DKA (diabetic ketoacidosis) ([place]) Patient presenting with confusion, found to have elevated blood glucose of 460 with an anion gap of 22 on admission.  A1c 15.0 correlating with extremely poor control outpatient.  Etiology likely secondary to medication noncompliance outpatient with her insulin.  She was initially started on insulin drip and transitioned to subcutaneous insulin.  Diabetic educator following. --Continue diabetic control as below.  Type 2 diabetes mellitus with hyperlipidemia (HCC) Hemoglobin A1c 15.0,  poorly controlled.  Suspect noncompliance outpatient and dietary discretions while inpatient. --Diabetic educator following, appreciate assistance --Semglee 25u [place] daily --Novolog 4u TIDAC --SSI for coverage --CBGs qAC/HS --Continue monitor blood sugars closely and continue to adjust  Essential hypertension --Benazepril  discontinued due to hyperkalemia  Hypokalemia; hyperkalemia Initially repleted during this hospitalization.  Now potassium level elevated, 5.3 this morning. --Benazepril discontinued; on no other medications that would cause elevated potassium --Lokelma 10 g p.o. twice daily --IV fluid hydration --Repeat BMP in the a.m.  Restless leg syndrome --Ropinirole 0.5 mg p.o. qHS  Elevated troponin Etiology likely secondary to type II demand ischemia in the setting of DKA and seizure as above.  EKG with no concerning dynamic changes.  No concerning arrhythmias noted on telemetry, now discontinued.    History of stroke --Continue statin --Not on aspirin/Plavix outpatient  Hypomagnesemia Repleted during hospitalization.  Continue to monitor intermittently.  COVID-19 virus infection Incidental finding with positive PCR on 04/05/2021.  Completed 10 days of airborne/contact isolation, now discontinued on 3/5  Status post colostomy ([place]) --Wound care consulted for continued colostomy recommendations while inpatient  COPD, mild ([place]) Stable.  Currently on room air --Albuterol MDI and 1-2 puffs every 4 hours.  Shortness of breath/wheezing  Protein-calorie malnutrition, severe Nutrition Status: Nutrition Problem: Severe Malnutrition Etiology: chronic illness (cancer, lupus) Signs/Symptoms: severe muscle depletion, severe fat depletion, percent weight loss Percent weight loss: 16.5 % (x5 months) Seen by nutrition.  Regular diet with thin liquids.  Continue to encourage increased supplementation.  Pressure injury of skin Continue local wound care, offloading.  Hyperkalemia Potassium 5.3 this morning, benazepril has been discontinued.  Unclear why her potassium remains elevated, no reported hemolysis on lab.  No other medications noted that would be causing elevated potassium level noted. --Lokelma 10 g p.o. twice daily --IV fluid hydration --Repeat BMP in a.m.  Behavior related to  cognitive impairment --Seroquel '25mg'$  qAm abd '50mg'$  PO qHS --clonazepam 0.'25mg'$  BID --melatonin '6mg'$  PO qHS    DVT prophylaxis:   Lovenox   Code Status: Full Code Family Communication: No family present at bedside this morning  Disposition Plan:  Level of care: Med-Surg Status is: Inpatient Remains inpatient appropriate because: Pending SNF placement     Consultants:  Neurology - signed off 2/27  Procedures:  EEG  Antimicrobials:  None   Subjective: Patient seen examined bedside, lying in bed.  No family present.  Remains on telemetry sitter and has right hand mitt in place.  Sleeping but arousable.  Completed breakfast this morning, asking for another breakfast tray.  Potassium remains elevated, although improved to 5.3 this morning.  Appears more calm today.  Discussed with nursing staff, may try to attempt to discontinue restraints and telemetry sitter.  Also discussed with charge nurse regarding moving patient closer to the nurses station for more frequent rounding.  Patient remains pleasantly confused.  No other questions or concerns at this time.  Denies headache, no chest pain, no shortness of breath, no abdominal pain.  Nursing concerned earlier this a.m. regarding elevated potassium greater than 7.5, although hemolyzed with repeat potassium 5.3; otherwise no acute events overnight per nursing staff.  Objective: Vitals:   04/18/21 0100 04/18/21 0500 04/18/21 0846 04/18/21 1147  BP: (!) 116/91 108/84 113/68 99/67  Pulse:  97 99 99  Resp:   18 16  Temp: (!) 97.5 F (36.4 C) 98.5 F (36.9 C) 97.8 F (36.6 C) 98.2 F (36.8 C)  TempSrc: Axillary Oral Axillary   SpO2: 100% 100%  100% 98%  Weight:  42.3 kg    Height:        Intake/Output Summary (Last 24 hours) at 04/18/2021 1211 Last data filed at 04/18/2021 9381 Gross per 24 hour  Intake --  Output 800 ml  Net -800 ml    Filed Weights   04/09/21 0500 04/13/21 0436 04/18/21 0500  Weight: 43.6 kg 43 kg 42.3 kg     Examination:  Physical Exam: GEN: NAD, alert, pleasantly confused, oriented to place [facility]), but not time  (2022), or person (president Raegan), or situation. thin/cachectic in appearance, appears older than stated age 9: NCAT, PERRL, EOMI, sclera clear, MMM PULM: CTAB w/o wheezes/crackles, normal respiratory effort, on room air CV: RRR w/o M/G/R GI: abd soft, NTND, NABS, no R/G/M, colostomy bag noted with soft brown stool in collection bag MSK: no peripheral edema, muscle strength globally intact 5/5 bilateral upper/lower extremities NEURO: CN II-XII intact, no focal deficits, sensation to light touch intact PSYCH: normal mood/affect Integumentary: Stage II wounds noted left buttock likely from excoriation    Data Reviewed: I have personally reviewed following labs and imaging studies  CBC: Recent Labs  Lab 04/14/21 0543 04/16/21 2107 04/18/21 0004  WBC 11.4* 11.9* 8.3  HGB 11.7* 10.6* 9.7*  HCT 35.3* 34.4* 29.9*  MCV 89.1 93.5 91.4  PLT 348 515* 017*   Basic Metabolic Panel: Recent Labs  Lab 04/14/21 0543 04/16/21 0352 04/16/21 0753 04/16/21 2107 04/17/21 0807 04/17/21 1749 04/17/21 2115 04/18/21 0004  NA 132* 126*  --   --  132*  --  128* 132*  K 5.5* 6.1*   < > 5.9* 5.7* 6.7* >7.5* 5.3*  CL 100 99  --   --  106  --  105 106  CO2 23 19*  --   --  20*  --  15* 18*  GLUCOSE 82 400*  --   --  231*  --  440* 394*  BUN 19 33*  --   --  30*  --  29* 29*  CREATININE 0.62 0.85  --   --  0.79  --  0.72 0.68  CALCIUM 9.1 8.9  --   --  8.9  --  8.3* 8.4*  MG 1.8 2.1  --   --   --   --   --  1.8  PHOS  --   --   --   --   --   --   --  3.9   < > = values in this interval not displayed.   GFR: Estimated Creatinine Clearance: 49.3 mL/min (by C-G formula based on SCr of 0.68 mg/dL). Liver Function Tests: Recent Labs  Lab 04/18/21 0004  AST 42*  ALT 46*  ALKPHOS 137*  BILITOT 0.5  PROT 7.0  ALBUMIN 2.2*    No results for input(s): LIPASE, AMYLASE  in the last 168 hours. No results for input(s): AMMONIA in the last 168 hours.  Coagulation Profile: No results for input(s): INR, PROTIME in the last 168 hours. Cardiac Enzymes: No results for input(s): CKTOTAL, CKMB, CKMBINDEX, TROPONINI in the last 168 hours. BNP (last 3 results) No results for input(s): PROBNP in the last 8760 hours. HbA1C: No results for input(s): HGBA1C in the last 72 hours. CBG: Recent Labs  Lab 04/17/21 1851 04/17/21 2119 04/18/21 0524 04/18/21 0937 04/18/21 1147  GLUCAP 336* 418* 198* 123* 225*   Lipid Profile: No results for input(s): CHOL, HDL, LDLCALC, TRIG, CHOLHDL, LDLDIRECT in the last 72 hours. Thyroid  Function Tests: No results for input(s): TSH, T4TOTAL, FREET4, T3FREE, THYROIDAB in the last 72 hours. Anemia Panel: No results for input(s): VITAMINB12, FOLATE, FERRITIN, TIBC, IRON, RETICCTPCT in the last 72 hours. Sepsis Labs: Recent Labs  Lab 04/16/21 2107  LATICACIDVEN 1.3    Recent Results (from the past 240 hour(s))  Culture, blood (routine x 2)     Status: None (Preliminary result)   Collection Time: 04/16/21  9:26 PM   Specimen: BLOOD RIGHT ARM  Result Value Ref Range Status   Specimen Description BLOOD RIGHT ARM  Final   Special Requests   Final    BOTTLES DRAWN AEROBIC AND ANAEROBIC Blood Culture results may not be optimal due to an inadequate volume of blood received in culture bottles   Culture   Final    NO GROWTH < 12 HOURS Performed at [facility], [place] [address], [place], [place] [address]    Report Status PENDING  Incomplete  Culture, blood (routine x 2)     Status: None (Preliminary result)   Collection Time: 04/16/21  9:30 PM   Specimen: BLOOD LEFT ARM  Result Value Ref Range Status   Specimen Description BLOOD LEFT ARM  Final   Special Requests   Final    BOTTLES DRAWN AEROBIC AND ANAEROBIC Blood Culture results may not be optimal due to an inadequate volume of blood received in culture bottles   Culture    Final    NO GROWTH < 12 HOURS Performed at [facility], [place] [address] [place], [place] [address]    Report Status PENDING  Incomplete          Radiology Studies: No results found.      Scheduled Meds:  atorvastatin  40 mg Oral QHS   clonazePAM  0.25 mg Oral BID   enoxaparin (LOVENOX) injection  20 mg Subcutaneous Q24H   feeding supplement (GLUCERNA SHAKE)  237 mL Oral TID BM   Gerhardt's butt cream   Topical QID   insulin aspart  0-5 Units Subcutaneous QHS   insulin aspart  0-9 Units Subcutaneous TID WC   insulin aspart  4 Units Subcutaneous TID WC   insulin glargine-yfgn  25 Units Subcutaneous Daily   levETIRAcetam  500 mg Oral BID   lipase/protease/amylase  72,000 Units Oral TID with meals   melatonin  6 mg Oral QHS   multivitamin with minerals  1 tablet Oral Daily   QUEtiapine  25 mg Oral QHS   QUEtiapine  25 mg Oral Q breakfast   rOPINIRole  0.5 mg Oral QHS   sodium zirconium cyclosilicate  10 g Oral BID   thiamine  100 mg Oral Daily   Continuous Infusions:  sodium chloride 75 mL/hr at 04/18/21 0646     LOS: 13 days    Time spent: 51 minutes spent on chart review, discussion with nursing staff, consultants, updating family and interview/physical exam; more than 50% of that time was spent in counseling and/or coordination of care.    Khristin Keleher J [place] DO [facility] Available via Epic secure chat 7am-7pm After these hours, please refer to coverage provider listed on [email or web address] 04/18/2021, 12:11 PM

## 2021-04-18 NOTE — Progress Notes (Signed)
?  X-cover Note: ?RN reports pt becoming agitated again. Will need to restart restraints. ? ? ?Kristopher Oppenheim, DO ?[facility] ? ?

## 2021-04-18 NOTE — Progress Notes (Signed)
Physical Therapy Treatment ?Patient Details ?Name: Brittany [name] ?MRN: [id number] ?DOB: 1960/05/14 ?Today's Date: 04/18/2021 ? ? ?History of Present Illness Brittany [name] is a 71yoF who was brought to the ED for confusion plus DKA.  EEG noted subclinical seizure.  Also found to have incidental COVID. 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 Pt admitted with diverticulitis of intestine with abscess. ? ?  ?PT Comments  ? ? Pt very emotional today but in tune with soiling herself. Pt followed commands well but continues to demo decreased insight to deficits and safety. Pt with improved ambulation tolerance however required min/modA for walker management and to maintain upright posture. Acute PT to cont to follow. ?   ?Recommendations for follow up therapy are one component of a multi-disciplinary discharge planning process, led by the attending physician.  Recommendations may be updated based on patient status, additional functional criteria and insurance authorization. ? ?Follow Up Recommendations ? Skilled nursing-short term rehab (<3 hours/day) ?  ?  ?Assistance Recommended at Discharge Frequent or constant Supervision/Assistance  ?Patient can return home with the following Assistance with cooking/housework;Direct supervision/assist for medications management;Help with stairs or ramp for entrance;Assist for transportation;A lot of help with walking and/or transfers;A lot of help with bathing/dressing/bathroom ?  ?Equipment Recommendations ? None recommended by PT  ?  ?Recommendations for Other Services   ? ? ?  ?Precautions / Restrictions Precautions ?Precautions: Fall ?Precaution Comments: seizure, urinary incontinence, colostomy bag frequently comes off ?Restrictions ?Weight Bearing Restrictions: No  ?  ? ?Mobility ? Bed Mobility ?Overal bed mobility: Needs Assistance ?Bed Mobility: Supine  to Sit ?  ?  ?Supine to sit: Min assist ?  ?  ?General bed mobility comments: minA at trunk for safety ?  ? ?Transfers ?Overall transfer level: Needs assistance ?Equipment used: 1 person hand held assist ?Transfers: Sit to/from Stand ?Sit to Stand: Min assist ?  ?  ?  ?  ?  ?General transfer comment: light assist to rise, steady, cues for hand placement when rising/sitting. ?  ? ?Ambulation/Gait ?Ambulation/Gait assistance: Min assist ?Gait Distance (Feet): 60 Feet ?Assistive device: Rolling walker (2 wheels) ?Gait Pattern/deviations: Step-through pattern, Decreased stride length, Narrow base of support ?Gait velocity: decr ?Gait velocity interpretation: <1.31 ft/sec, indicative of household ambulator ?  ?General Gait Details: cues for upright posture, placement in RW, minA for walker management ? ? ?Stairs ?  ?  ?  ?  ?  ? ? ?Wheelchair Mobility ?  ? ?Modified Rankin (Stroke Patients Only) ?  ? ? ?  ?Balance Overall balance assessment: Needs assistance ?Sitting-balance support: No upper extremity supported, Feet supported ?Sitting balance-Leahy Scale: Good ?  ?  ?Standing balance support: Bilateral upper extremity supported, During functional activity ?Standing balance-Leahy Scale: Poor ?Standing balance comment: reliant on external support ?  ?  ?  ?  ?  ?  ?  ?  ?  ?  ?  ?  ? ?  ?Cognition Arousal/Alertness: Awake/alert ?Behavior During Therapy: [facility] for tasks assessed/performed (emotionally labile) ?Overall Cognitive Status: No family/caregiver present to determine baseline cognitive functioning ?Area of Impairment: Safety/judgement, Awareness, Problem solving ?  ?  ?  ?  ?  ?  ?  ?  ?  ?  ?  ?Following Commands: Follows one step commands with increased time, Follows one step commands inconsistently ?Safety/Judgement: Decreased awareness of safety, Decreased awareness of  deficits ?Awareness: Emergent (stated she had to pee but couldn't hold it) ?Problem Solving: Slow processing, Decreased initiation, Difficulty  sequencing, Requires verbal cues ?General Comments: pt emotionally labile, pt aware her colostomy bag came unsealed and was leaking, aware she had to pee but couldn't hold it, urinary incontinence all over floor ?  ?  ? ?  ?Exercises   ? ?  ?General Comments General comments (skin integrity, edema, etc.): pt with urinary incontinence, dependent for hygiene ?  ?  ? ?Pertinent Vitals/Pain Pain Assessment ?Pain Assessment: Faces ?Faces Pain Scale: No hurt  ? ? ?Home Living   ?  ?  ?  ?  ?  ?  ?  ?  ?  ?   ?  ?Prior Function    ?  ?  ?   ? ?PT Goals (current goals can now be found in the care plan section) Acute Rehab PT Goals ?PT Goal Formulation: With patient ?Time For Goal Achievement: 04/25/21 ?Potential to Achieve Goals: Good ?Progress towards PT goals: Progressing toward goals ? ?  ?Frequency ? ? ? Min 2X/week ? ? ? ?  ?PT Plan Current plan remains appropriate  ? ? ?Co-evaluation   ?  ?  ?  ?  ? ?  ?AM-PAC PT "6 Clicks" Mobility   ?Outcome Measure ? Help needed turning from your back to your side while in a flat bed without using bedrails?: A Little ?Help needed moving from lying on your back to sitting on the side of a flat bed without using bedrails?: A Little ?Help needed moving to and from a bed to a chair (including a wheelchair)?: A Little ?Help needed standing up from a chair using your arms (e.g., wheelchair or bedside chair)?: A Lot ?Help needed to walk in hospital room?: A Lot ?Help needed climbing 3-5 steps with a railing? : A Lot ?6 Click Score: 15 ? ?  ?End of Session Equipment Utilized During Treatment: Gait belt ?Activity Tolerance: Patient limited by fatigue;Patient tolerated treatment well ?Patient left: with call bell/phone within reach;in chair;with chair alarm set ?Nurse Communication: Mobility status ?PT Visit Diagnosis: Unsteadiness on feet (R26.81);Muscle weakness (generalized) (M62.81);Difficulty in walking, not elsewhere classified (R26.2);Ataxic gait (R26.0) ?  ? ? ?Time: 1655-3748 ?PT  Time Calculation (min) (ACUTE ONLY): 40 min ? ?Charges:  $Gait Training: 8-22 mins ?$Therapeutic Activity: 23-37 mins          ?          ? ?Kittie Plater, PT, DPT ?Acute Rehabilitation Services ?Pager #: [phone] ?Office #: [phone] ? ? ? ?Demetric Dunnaway M Sabriyah Wilcher ?04/18/2021, 3:22 PM ? ?

## 2021-04-19 DIAGNOSIS — R451 Restlessness and agitation: Secondary | ICD-10-CM

## 2021-04-19 LAB — BASIC METABOLIC PANEL
Anion gap: 9 (ref 5–15)
BUN: 19 mg/dL (ref 8–23)
CO2: 20 mmol/L — ABNORMAL LOW (ref 22–32)
Calcium: 8.8 mg/dL — ABNORMAL LOW (ref 8.9–10.3)
Chloride: 106 mmol/L (ref 98–111)
Creatinine, Ser: 0.46 mg/dL (ref 0.44–1.00)
GFR, Estimated: >60 mL/min (ref >60)
Glucose, Bld: 62 mg/dL — ABNORMAL LOW (ref 70–99)
Potassium: 4.7 mmol/L (ref 3.5–5.1)
Sodium: 135 mmol/L (ref 135–145)

## 2021-04-19 LAB — GLUCOSE, CAPILLARY
Glucose-Capillary: 77 mg/dL (ref 70–99)
Glucose-Capillary: 240 mg/dL — ABNORMAL HIGH (ref 70–99)
Glucose-Capillary: 50 mg/dL — ABNORMAL LOW (ref 70–99)
Glucose-Capillary: 133 mg/dL — ABNORMAL HIGH (ref 70–99)
Glucose-Capillary: 66 mg/dL — ABNORMAL LOW (ref 70–99)
Glucose-Capillary: 56 mg/dL — ABNORMAL LOW (ref 70–99)
Glucose-Capillary: 65 mg/dL — ABNORMAL LOW (ref 70–99)
Glucose-Capillary: 98 mg/dL (ref 70–99)
Glucose-Capillary: 133 mg/dL — ABNORMAL HIGH (ref 70–99)
Glucose-Capillary: 259 mg/dL — ABNORMAL HIGH (ref 70–99)

## 2021-04-19 MED ORDER — INSULIN GLARGINE-YFGN 100 UNIT/ML ~~LOC~~ SOLN
22.0000 [IU] | Freq: Every day | SUBCUTANEOUS | Status: DC
  Filled 2021-04-19: qty 0.22

## 2021-04-19 MED ORDER — DEXTROSE 50 % IV SOLN
12.5000 g | INTRAVENOUS | Status: AC
  Administered 2021-04-19: 12.5 g via INTRAVENOUS

## 2021-04-19 MED ORDER — DIVALPROEX SODIUM 250 MG PO DR TAB
250.0000 mg | DELAYED_RELEASE_TABLET | Freq: Two times a day (BID) | ORAL | Status: DC
  Administered 2021-04-19 – 2021-04-24 (×10): 250 mg via ORAL
  Filled 2021-04-19 (×11): qty 1

## 2021-04-19 MED ORDER — CLONAZEPAM 0.25 MG PO TBDP
0.5000 mg | ORAL_TABLET | Freq: Two times a day (BID) | ORAL | Status: DC
Start: 2021-04-19 — End: 2021-04-21
  Administered 2021-04-19 – 2021-04-21 (×5): 0.5 mg via ORAL
  Filled 2021-04-19 (×6): qty 2

## 2021-04-19 MED ORDER — HALOPERIDOL LACTATE 5 MG/ML IJ SOLN
5.0000 mg | Freq: Four times a day (QID) | INTRAMUSCULAR | Status: DC | PRN
  Administered 2021-04-19 – 2021-04-25 (×8): 5 mg via INTRAVENOUS
  Filled 2021-04-19 (×8): qty 1

## 2021-04-19 MED ORDER — INSULIN ASPART 100 UNIT/ML IJ SOLN: 6.0000 [IU] | Freq: Three times a day (TID) | INTRAMUSCULAR | Status: DC

## 2021-04-19 NOTE — Progress Notes (Signed)
Inpatient Diabetes Program Recommendations ? ?AACE/ADA: New Consensus Statement on Inpatient Glycemic Control (2015) ? ?Target Ranges:  Prepandial:   less than 140 mg/dL ?     Peak postprandial:   less than 180 mg/dL (1-2 hours) ?     Critically ill patients:  140 - 180 mg/dL  ? ?Lab Results  ?Component Value Date  ? GLUCAP 77 04/19/2021  ? HGBA1C 15.0 (H) 04/06/2021  ? ? ?Review of Glycemic Control ? Latest Reference Range & Units 04/17/21 21:19 04/18/21 05:24 04/18/21 09:37 04/18/21 11:47 04/18/21 12:14 04/18/21 16:51 04/18/21 21:10 04/19/21 06:09  ?Glucose-Capillary 70 - 99 mg/dL 418 (H) 198 (H) 123 (H) 225 (H) 294 (H) 336 (H) 271 (H) 77  ?(H): Data is abnormally high ? ?Home DM Meds:  70/30 Insulin 30 units QAM with breakfast ?  ?  ?Current Orders: Semglee 25 units QHS ?                           Novolog 4 units tid meal coverage ?                          Novolog Sensitive Correction Scale/ SSI (0-9 units) TID AC + HS ? ?Inpatient Diabetes Program Recommendations:   ?Fasting CBG 77. ?Please consider: ?-Decrease Semglee 22 units q hs ?-Increase Novolog meal coverage to 6 units tid if eats 50% ?Secure chat sent to Dr. [place] ? ?Thank you, ?Nani Gasser Avraham Benish, RN, MSN, CDE  ?Diabetes Coordinator ?Inpatient Glycemic Control Team ?Team Pager [phone] (8am-5pm) ?04/19/2021 11:59 AM ? ? ? ? ?

## 2021-04-19 NOTE — Consult Note (Signed)
Brief Psychiatry Consult Note  ?Was consulted for agitation by Dr. [place] - pt admitted with encephalopathy which is ongoing. Unfortunately unable to see pt today due to service volume - in any case recent notes indicate pt is unable to participate in interview after receiving haloperidol. Had recommended trial of depakote especially given lack of response to haloperidol and seizure activity at admission; pt also on keppra which has significant neuropsychiatric side effects in ~1 in 7 patients and may allow to de-escalate that medication. This has been started by primary team.  Will see patient for full formal consult tomorrow.  ? ?Joycelyn Schmid A Olisa Quesnel ? ?

## 2021-04-19 NOTE — Progress Notes (Signed)
OT Cancellation Note ? ?Patient Details ?Name: Brittany [name] ?MRN: [id number] ?DOB: 1960-08-14 ? ? ?Cancelled Treatment:    Reason Eval/Treat Not Completed: Fatigue/lethargy limiting ability to participate;Patient's level of consciousness (Pt unable to rouse. RN reports she has not rested in multiple days and recently recieved Haldol. Sleeping soundly, posey belt and bilat wrist restraints donned. OT treatment to continue efforts.) ? ?[name] A [name] ?04/19/2021, 2:04 PM ?

## 2021-04-19 NOTE — Progress Notes (Signed)
Patient remains confused and continues to try to get out of bed .Patient has pulled IV out and has been cursing and trying to hit staff. Patient placed in bilateral wrist restraints per MD order. ?  ?

## 2021-04-19 NOTE — Progress Notes (Signed)
This RN called into room by telesitter.  Pt had removed her wrist restraints and was at the end of the bed eating fruit.  Patient unable to be redirected at this time for safety.  Pt placed back into bed and re-tied wrist restraints.  Pt cussing at staff, attempted to punch this RN and attempted to kick [place], NT.  Pt refused to take any PO medications tonight unless "You give me a cigarette and a pepsi".  This RN educated patient that she needs to take depakote for seizures, pt stated "Go fuck yourself, I don't have to do anything."  Pt medicated with PRN haloperidol per MAR.  Pt was able to be fed a snack, given a drink and given a vanilla ensure to help with previous blood sugar issues.  Pt's light turned down and calming music playing in room to emphasize rest.  Will continue to monitor.   ? 04/19/21 2100  ?Complaints & Interventions  ?Complains of Agitation;Other (Comment) ?(physical aggression with staff)  ?Interventions Medication (see MAR);Deescalate;Reposition  ? ? ?

## 2021-04-19 NOTE — Progress Notes (Addendum)
PROGRESS NOTE    Brittany [name]  DUK:[id number] DOB: [birth year] DOA: 04/05/2021 PCP: Clinic, [facility]    Brief Narrative:  Brittany [name] is a 61 year old female with past medical history significant for hypertension, diabetes mellitus, CVA with right-sided weakness, COPD, tobacco abuse, and status post pancreatectomy and splenectomy, anxiety/depression, discoid lupus erythematosus, hyperlipidemia who presented to [facility] ED on 2/22 via EMS with confusion.    Per her daughter at baseline, patient is oriented x 3. She has a history of stroke with right-sided weakness. Normally pt can use a cane to walk. This AM, pt was found to be on the floor with confusion. Pt has jerking and slurred speech per her daughter.    Per EDP, EMS reported that the house was extremely filthy. The patient appeared to be in a soiled diaper. Pt has an colostomy with no collection bag in place. Daughter did not know which medications the patient was on.  EMS states they witnessed what could be a partial seizure, and gave the patient a total of 4 mg of intranasal Versed in route to the hospital. When I saw pt in ED. Pt is confused, not orientated x3. She has right-sided weakness which is likely from previous stroke.  No active cough, respiratory distress noted.  No active nausea and vomiting noted.  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 Pt does not seem to have chest pain.  Not sure if patient has symptoms of UTI.  Her daughter is not sure if patient has a history of seizure.  Daughter states that she found a Keppra bottle which is about 62 years old, not sure if patient is taking his medications or not.   In the ED, glucose 460, bicarbonate 18, anion gap 22, WBC 10.9, INR 1.0, troponin level 21, pending beta hydroxybutyric acid, pending urinalysis, positive COVID PCR, potassium 3.4, GFR>60, temperature normal, blood pressure 149/94, heart rate 99, RR 18, oxygen saturation 98% on room  air.  ABG with pH 7.36, CO2 36, O2 52. Patient is admitted to stepdown as inpatient. Patient was noted to have DKA, subclinical seizure on EEG and incidental finding of COVID-19 viral infection.  Neurology was consulted and patient was transferred to [facility] for continuous EEG.  Admitted to the hospital service.       Assessment and Plan: * New onset seizure ([place]) Suspect underlying etiology is previous CVA due to lowered seizure threshold made worse by DKA/COVID.  Neurology was consulted and followed during initial hospital course.  Discussed with psychiatry, given behavioral disturbances may be related to Keppra use, will discontinue Keppra in favor of Depakote. --Depakote 250 mg p.o. twice daily --Follow Depakote level in a.m. --Outpatient follow-up with neurology in 2 to 3 months.  Acute metabolic encephalopathy Improved since admission, but seems to be waxing and waning.  Likely complicated by hospital-acquired delirium and fluctuations in glucose due to noncompliance with diet.  Working with physical and Occupational Therapy.  Will need to discharge to skilled nursing for rehab and then may need to live with her sister as she is likely unable to manage her ADLs at home alone.  DKA (diabetic ketoacidosis) ([place]) Patient presenting with confusion, found to have elevated blood glucose of 460 with an anion gap of 22 on admission.  A1c 15.0 correlating with extremely poor control outpatient.  Etiology likely secondary to medication noncompliance outpatient with her insulin.  She was initially started on insulin drip and transitioned to subcutaneous insulin.  Diabetic educator following. --Continue diabetic control as below.  Type 2 diabetes mellitus with hyperlipidemia (HCC) Hemoglobin A1c 15.0, poorly controlled.  Suspect noncompliance outpatient and dietary discretions while inpatient. --Diabetic educator following, appreciate assistance --Semglee 22u [place] daily --Novolog 6u  TIDAC --SSI for coverage --CBGs qAC/HS --Continue monitor blood sugars closely and continue to adjust  Essential hypertension --Benazepril discontinued due to hyperkalemia  Hypokalemia; hyperkalemia Initially repleted during this hospitalization.  Now potassium level elevated, 4.7 this morning. --Benazepril discontinued; on no other medications that would cause elevated potassium --Discontinue Lokelma today --IV fluid hydration --Repeat BMP in the a.m.  Restless leg syndrome --Ropinirole 0.5 mg p.o. qHS  Elevated troponin Etiology likely secondary to type II demand ischemia in the setting of DKA and seizure as above.  EKG with no concerning dynamic changes.  No concerning arrhythmias noted on telemetry, now discontinued.    History of stroke --Continue statin --Not on aspirin/Plavix outpatient  Hypomagnesemia Repleted during hospitalization.  Continue to monitor intermittently.  COVID-19 virus infection Incidental finding with positive PCR on 04/05/2021.  Completed 10 days of airborne/contact isolation, now discontinued on 3/5  Status post colostomy ([place]) --Wound care consulted for continued colostomy recommendations while inpatient  COPD, mild ([place]) Stable.  Currently on room air --Albuterol MDI and 1-2 puffs every 4 hours.  Shortness of breath/wheezing  Protein-calorie malnutrition, severe Nutrition Status: Nutrition Problem: Severe Malnutrition Etiology: chronic illness (cancer, lupus) Signs/Symptoms: severe muscle depletion, severe fat depletion, percent weight loss Percent weight loss: 16.5 % (x5 months) Seen by nutrition.  Regular diet with thin liquids.  Continue to encourage increased supplementation.  Pressure injury of skin Continue local wound care, offloading.  Hyperkalemia Benazepril has been discontinued.  Unclear why her potassium remains elevated, no reported hemolysis on lab.  No other medications noted that would be causing elevated potassium level  noted. --Potassium now 4.7 this morning, discontinue Lokelma --IV fluid hydration --Repeat BMP in a.m.  Behavior related to cognitive impairment --Seroquel '25mg'$  qAm abd '50mg'$  PO qHS --clonazepam 0.'5mg'$  BID --melatonin '6mg'$  PO qHS --SLP for cognitive evaluation --Discussed with psychiatry 3/8, Keppra could be causing neuropsych side effects and recommend discontinuation of Keppra and starting Depakote as above.     DVT prophylaxis:   Lovenox   Code Status: Full Code Family Communication: No family present at bedside this morning  Disposition Plan:  Level of care: Med-Surg Status is: Inpatient Remains inpatient appropriate because: Pending SNF placement     Consultants:  Neurology - signed off 2/27  Procedures:  EEG  Antimicrobials:  None   Subjective: Patient seen examined bedside, lying in bed.  No family present.  Placed back on restraints and tele-monitor overnight for agitation, combative and trying to get out of bed.  Potassium now normalized.  Glucose remains difficult to control.  Patient remains pleasantly confused, but currently calm with wrist restraints/hand mitts in place.  Discussed with psychiatry, possible that [place] may be causing neuropsych side effects and recommended discontinuation of Keppra and will start Depakote today.  Patient otherwise denies chest pain, no shortness of breath, no abdominal pain.  No other acute events overnight per nursing staff.  Objective: Vitals:   04/19/21 0344 04/19/21 0839 04/19/21 0844 04/19/21 1154  BP: 135/86 (!) 160/116 (!) 156/107 (!) 161/98  Pulse: 83 77 80 80  Resp: '20 16  14  '$ Temp: (!) 97.5 F (36.4 C) (!) 97.3 F (36.3 C)    TempSrc: Oral Oral    SpO2: 100% 100%  100%  Weight:  Height:        Intake/Output Summary (Last 24 hours) at 04/19/2021 1203 Last data filed at 04/19/2021 1025 Gross per 24 hour  Intake 2451.04 ml  Output 1150 ml  Net 1301.04 ml    Filed Weights   04/09/21 0500 04/13/21 0436  04/18/21 0500  Weight: 43.6 kg 43 kg 42.3 kg    Examination:  Physical Exam: GEN: NAD, alert, pleasantly confused, not oriented to place ([facility]), time (2026), or person (no answer, yesterday stated President Reagan), or situation. thin/cachectic, chronically ill in appearance, appears older than stated age 9: NCAT, PERRL, EOMI, sclera clear, MMM PULM: CTAB w/o wheezes/crackles, normal respiratory effort, on room air CV: RRR w/o M/G/R GI: abd soft, NTND, NABS, no R/G/M, colostomy bag noted with soft brown stool in collection bag MSK: no peripheral edema, muscle strength globally intact 5/5 bilateral upper/lower extremities NEURO: CN II-XII intact, no focal deficits, sensation to light touch intact PSYCH: normal mood/affect Integumentary: Stage II wounds noted left buttock likely from excoriation    Data Reviewed: I have personally reviewed following labs and imaging studies  CBC: Recent Labs  Lab 04/14/21 0543 04/16/21 2107 04/18/21 0004  WBC 11.4* 11.9* 8.3  HGB 11.7* 10.6* 9.7*  HCT 35.3* 34.4* 29.9*  MCV 89.1 93.5 91.4  PLT 348 515* 960*   Basic Metabolic Panel: Recent Labs  Lab 04/14/21 0543 04/16/21 0352 04/16/21 0753 04/17/21 0807 04/17/21 1749 04/17/21 2115 04/18/21 0004 04/19/21 0750  NA 132* 126*  --  132*  --  128* 132* 135  K 5.5* 6.1*   < > 5.7* 6.7* >7.5* 5.3* 4.7  CL 100 99  --  106  --  105 106 106  CO2 23 19*  --  20*  --  15* 18* 20*  GLUCOSE 82 400*  --  231*  --  440* 394* 62*  BUN 19 33*  --  30*  --  29* 29* 19  CREATININE 0.62 0.85  --  0.79  --  0.72 0.68 0.46  CALCIUM 9.1 8.9  --  8.9  --  8.3* 8.4* 8.8*  MG 1.8 2.1  --   --   --   --  1.8  --   PHOS  --   --   --   --   --   --  3.9  --    < > = values in this interval not displayed.   GFR: Estimated Creatinine Clearance: 49.3 mL/min (by C-G formula based on SCr of 0.46 mg/dL). Liver Function Tests: Recent Labs  Lab 04/18/21 0004  AST 42*  ALT 46*  ALKPHOS 137*  BILITOT 0.5   PROT 7.0  ALBUMIN 2.2*    No results for input(s): LIPASE, AMYLASE in the last 168 hours. No results for input(s): AMMONIA in the last 168 hours.  Coagulation Profile: No results for input(s): INR, PROTIME in the last 168 hours. Cardiac Enzymes: No results for input(s): CKTOTAL, CKMB, CKMBINDEX, TROPONINI in the last 168 hours. BNP (last 3 results) No results for input(s): PROBNP in the last 8760 hours. HbA1C: No results for input(s): HGBA1C in the last 72 hours. CBG: Recent Labs  Lab 04/18/21 1214 04/18/21 1651 04/18/21 2110 04/19/21 0609 04/19/21 1200  GLUCAP 294* 336* 271* 77 240*   Lipid Profile: No results for input(s): CHOL, HDL, LDLCALC, TRIG, CHOLHDL, LDLDIRECT in the last 72 hours. Thyroid Function Tests: No results for input(s): TSH, T4TOTAL, FREET4, T3FREE, THYROIDAB in the last 72 hours. Anemia Panel: No  results for input(s): VITAMINB12, FOLATE, FERRITIN, TIBC, IRON, RETICCTPCT in the last 72 hours. Sepsis Labs: Recent Labs  Lab 04/16/21 2107  LATICACIDVEN 1.3    Recent Results (from the past 240 hour(s))  Culture, blood (routine x 2)     Status: None (Preliminary result)   Collection Time: 04/16/21  9:26 PM   Specimen: BLOOD RIGHT ARM  Result Value Ref Range Status   Specimen Description BLOOD RIGHT ARM  Final   Special Requests   Final    BOTTLES DRAWN AEROBIC AND ANAEROBIC Blood Culture results may not be optimal due to an inadequate volume of blood received in culture bottles   Culture   Final    NO GROWTH 3 DAYS Performed at [facility], [place] [address] [place], [place] [address]    Report Status PENDING  Incomplete  Culture, blood (routine x 2)     Status: None (Preliminary result)   Collection Time: 04/16/21  9:30 PM   Specimen: BLOOD LEFT ARM  Result Value Ref Range Status   Specimen Description BLOOD LEFT ARM  Final   Special Requests   Final    BOTTLES DRAWN AEROBIC AND ANAEROBIC Blood Culture results may not be optimal due to an  inadequate volume of blood received in culture bottles   Culture   Final    NO GROWTH 3 DAYS Performed at [facility], [place] [address], [place], [place] [address]    Report Status PENDING  Incomplete          Radiology Studies: No results found.      Scheduled Meds:  atorvastatin  40 mg Oral QHS   clonazePAM  0.5 mg Oral BID   divalproex  250 mg Oral Q12H   enoxaparin (LOVENOX) injection  20 mg Subcutaneous Q24H   feeding supplement (GLUCERNA SHAKE)  237 mL Oral TID BM   Gerhardt's butt cream   Topical QID   insulin aspart  0-5 Units Subcutaneous QHS   insulin aspart  0-9 Units Subcutaneous TID WC   insulin aspart  6 Units Subcutaneous TID WC   [START ON 04/20/2021] insulin glargine-yfgn  22 Units Subcutaneous Daily   lipase/protease/amylase  72,000 Units Oral TID with meals   melatonin  6 mg Oral QHS   multivitamin with minerals  1 tablet Oral Daily   QUEtiapine  25 mg Oral Q breakfast   QUEtiapine  50 mg Oral QHS   rOPINIRole  0.5 mg Oral QHS   thiamine  100 mg Oral Daily   Continuous Infusions:     LOS: 14 days    Time spent: 51 minutes spent on chart review, discussion with nursing staff, consultants, updating family and interview/physical exam; more than 50% of that time was spent in counseling and/or coordination of care.    Martia Dalby J [place] DO [facility] Available via Epic secure chat 7am-7pm After these hours, please refer to coverage provider listed on [email or web address] 04/19/2021, 12:03 PM

## 2021-04-19 NOTE — Progress Notes (Signed)
This nurse attempted to call patient daughter Para Cossey to notify her of need for restraints however no one answered the phone.Message left to call hospital for update concerning mother care. ?

## 2021-04-19 NOTE — Progress Notes (Addendum)
HOSPITAL MEDICINE OVERNIGHT EVENT NOTE   ? ?Nursing raised concern of recurrent hypoglycemia with blood sugar 56 at 5:30 PM and 65 at 7:21 PM.  Half amp of D50 given on each occurrence.   ? ?Nursing suspects that this is occurring due to patient being lethargic and not taking in any p.o. intake since Haldol ministration at 12:21PM earlier in the day.   ? ?If hypoglycemia recurs will place on dextrose infusion.  Semglee may additionally need to be held in the morning if hypoglycemia lethargy and poor p.o. intake persist. ? ?Brittany Emerald  MD ?[facility]  ? ?ADDENDUM (6:15am 3/9) ? ?Patient has developed recurrent hypoglycemia this morning at 6 AM at 48.  Additionally, patient continues to be lethargic although this is improving.   ? ?Nursing has administered some juice and Ensure however since hypoglycemia continues to recur we will discontinue scheduled mealtime insulin and Semglee for now.   ? ?Will place patient on serial blood sugar checks every 4 hours as well as placing patient on a D5 half-normal saline infusion at 75 cc an hour.  This may need to be transitioned to D10 infusion if patient continues to be hypoglycemic even with this. ? ?Brittany [name] ? ? ? ? ? ? ? ? ? ? ? ? ? ?

## 2021-04-19 NOTE — Progress Notes (Signed)
Patient remains confused trying to get out of bed in between side rails. Patient has tele sitter at bedside and so far has been undirectable. Patient placed in soft waist belt restraint per MD order for safety. ?

## 2021-04-20 LAB — COMPREHENSIVE METABOLIC PANEL
ALT: 51 U/L — ABNORMAL HIGH (ref 0–44)
AST: 50 U/L — ABNORMAL HIGH (ref 15–41)
Albumin: 2.3 g/dL — ABNORMAL LOW (ref 3.5–5.0)
Alkaline Phosphatase: 119 U/L (ref 38–126)
Anion gap: 9 (ref 5–15)
BUN: 16 mg/dL (ref 8–23)
CO2: 23 mmol/L (ref 22–32)
Calcium: 8.9 mg/dL (ref 8.9–10.3)
Chloride: 106 mmol/L (ref 98–111)
Creatinine, Ser: 0.54 mg/dL (ref 0.44–1.00)
GFR, Estimated: >60 mL/min (ref >60)
Glucose, Bld: 189 mg/dL — ABNORMAL HIGH (ref 70–99)
Potassium: 4.6 mmol/L (ref 3.5–5.1)
Sodium: 138 mmol/L (ref 135–145)
Total Bilirubin: 0.3 mg/dL (ref 0.3–1.2)
Total Protein: 6.7 g/dL (ref 6.5–8.1)

## 2021-04-20 LAB — GLUCOSE, CAPILLARY
Glucose-Capillary: 120 mg/dL — ABNORMAL HIGH (ref 70–99)
Glucose-Capillary: 141 mg/dL — ABNORMAL HIGH (ref 70–99)
Glucose-Capillary: 151 mg/dL — ABNORMAL HIGH (ref 70–99)
Glucose-Capillary: 288 mg/dL — ABNORMAL HIGH (ref 70–99)
Glucose-Capillary: 389 mg/dL — ABNORMAL HIGH (ref 70–99)
Glucose-Capillary: 48 mg/dL — ABNORMAL LOW (ref 70–99)
Glucose-Capillary: 54 mg/dL — ABNORMAL LOW (ref 70–99)

## 2021-04-20 LAB — CBC
HCT: 30.9 % — ABNORMAL LOW (ref 36.0–46.0)
Hemoglobin: 10.2 g/dL — ABNORMAL LOW (ref 12.0–15.0)
MCH: 29.4 pg (ref 26.0–34.0)
MCHC: 33 g/dL (ref 30.0–36.0)
MCV: 89 fL (ref 80.0–100.0)
Platelets: 584 10*3/uL — ABNORMAL HIGH (ref 150–400)
RBC: 3.47 MIL/uL — ABNORMAL LOW (ref 3.87–5.11)
RDW: 15.3 % (ref 11.5–15.5)
WBC: 14.9 10*3/uL — ABNORMAL HIGH (ref 4.0–10.5)
nRBC: 0 % (ref 0.0–0.2)

## 2021-04-20 LAB — MAGNESIUM: Magnesium: 1.6 mg/dL — ABNORMAL LOW (ref 1.7–2.4)

## 2021-04-20 LAB — VALPROIC ACID LEVEL: Valproic Acid Lvl: <10 ug/mL — ABNORMAL LOW (ref 50.0–100.0)

## 2021-04-20 MED ORDER — MIRTAZAPINE 15 MG PO TBDP
7.5000 mg | ORAL_TABLET | Freq: Every day | ORAL | Status: DC
  Administered 2021-04-20 – 2021-04-22 (×3): 7.5 mg via ORAL
  Filled 2021-04-20 (×4): qty 0.5

## 2021-04-20 MED ORDER — INSULIN ASPART 100 UNIT/ML IJ SOLN
0.0000 [IU] | INTRAMUSCULAR | Status: DC
  Administered 2021-04-20: 1 [IU] via SUBCUTANEOUS
  Administered 2021-04-20: 19:00:00 5 [IU] via SUBCUTANEOUS
  Administered 2021-04-20: 13:00:00 9 [IU] via SUBCUTANEOUS
  Administered 2021-04-20: 20:00:00 2 [IU] via SUBCUTANEOUS
  Administered 2021-04-21: 11 [IU] via SUBCUTANEOUS
  Administered 2021-04-21 (×2): 2 [IU] via SUBCUTANEOUS
  Administered 2021-04-21: 7 [IU] via SUBCUTANEOUS
  Administered 2021-04-22: 9 [IU] via SUBCUTANEOUS
  Administered 2021-04-22: 7 [IU] via SUBCUTANEOUS
  Administered 2021-04-22: 3 [IU] via SUBCUTANEOUS
  Administered 2021-04-22: 5 [IU] via SUBCUTANEOUS
  Administered 2021-04-23: 3 [IU] via SUBCUTANEOUS
  Administered 2021-04-23: 7 [IU] via SUBCUTANEOUS
  Administered 2021-04-23: 5 [IU] via SUBCUTANEOUS
  Administered 2021-04-23: 2 [IU] via SUBCUTANEOUS
  Administered 2021-04-23: 9 [IU] via SUBCUTANEOUS
  Administered 2021-04-23: 7 [IU] via SUBCUTANEOUS
  Administered 2021-04-24 (×2): 2 [IU] via SUBCUTANEOUS
  Administered 2021-04-24 (×2): 5 [IU] via SUBCUTANEOUS
  Administered 2021-04-25: 2 [IU] via SUBCUTANEOUS

## 2021-04-20 MED ORDER — DEXTROSE-NACL 5-0.45 % IV SOLN: INTRAVENOUS | Status: DC

## 2021-04-20 MED ORDER — MAGNESIUM SULFATE 2 GM/50ML IV SOLN
2.0000 g | Freq: Once | INTRAVENOUS | Status: AC
  Administered 2021-04-20: 11:00:00 2 g via INTRAVENOUS
  Filled 2021-04-20: qty 50
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
## 2021-04-20 NOTE — Progress Notes (Signed)
HOSPITAL MEDICINE OVERNIGHT EVENT NOTE   ? ?Notified by nursing patient continues to be agitated throughout the shift.  Patient is regularly attempting to get out of bed, continue to pull on medical devices, does not consistently follow commands and is impeding medical care. ? ?Nursing reports that there is no bedside sitter available for this patient.  Patient is currently using a telemetry sitter without success. ? ?We will renew order for restraints.  We will continue to follow closely and discontinue as able. ? ?Vernelle Emerald  MD ?[facility]  ? ? ? ? ? ? ? ? ? ? ?

## 2021-04-20 NOTE — Consult Note (Signed)
[facility] New Face-to-Face Psychiatric Evaluation   Service Date: April 20, 2021 LOS:  LOS: 15 days    Assessment  Brittany [name] is a 61 y.o. female admitted medically for 04/05/2021  9:28 PM for DKA. She carries the psychiatric diagnoses of  and has a past medical history of MDD and anxiety.Psychiatry was consulted for agitation and medication mgmt by Eric [place] MD.    Her current presentation of waxing and waning agitation is most consistent with delirium. Patient will require a reassessment in the AM. Currently her Outpatient psychotropic medication are unknown but may include Remeron '15mg'$  QHS and gabapentin '300mg'$  TID. It is unknown if patient is compliant. On initial examination, patient patient is pleasant disoriented but redirectable and attempting participate in assessment. Please see plan below for detailed recommendations.   On assessment patient appears to be anxious and confused and her behavior is likely 2/2 to delirium. Patient forgets she is in the hospital and endorses being confused when staff are reaching towards her. Patient also endorses some AH and illusions.   Diagnoses:  Active Hospital problems: Principal Problem:   New onset seizure ([place]) Active Problems:   COPD, mild (HCC)   Type 2 diabetes mellitus with hyperlipidemia ([place])   Essential hypertension   Restless leg syndrome   DKA (diabetic ketoacidosis) (HCC)   Hypokalemia; hyperkalemia   Acute metabolic encephalopathy   Protein-calorie malnutrition, severe   Elevated troponin   History of stroke   Hypomagnesemia   COVID-19 virus infection   Status post colostomy ([place])   Pressure injury of skin   Behavior related to cognitive impairment     Plan  ## Safety and Observation Level:  - Based on my clinical evaluation, I estimate the patient to be at low risk of self harm in the current setting - At this time, we recommend a routine level of observation. This decision is based on my  review of the chart including patient's history and current presentation, interview of the patient, mental status examination, and consideration of suicide risk including evaluating suicidal ideation, plan, intent, suicidal or self-harm behaviors, risk factors, and protective factors. This judgment is based on our ability to directly address suicide risk, implement suicide prevention strategies and develop a safety plan while the patient is in the clinical setting. Please contact our team if there is a concern that risk level has changed.   ## Medications:  -- Discontinue Seroquel  -- Start Remeron 7.'5mg'$  QHS -- Continue Depakote '250mg'$  BID -- Melatonin '6mg'$  -- Attempted to reach out to daughter 2x, let a HIPPA complaint message will try again tom.  ## Medical Decision Making Capacity:  -- Not formally assessed  ## Further Work-up:  -- Recommend UA   -- NO EKG this hospitalization -- Pertinent labwork reviewed earlier this admission includes:   ## Disposition:  -- CBC- WBC 14.9/Hgb 10.2/ PLT 584, CMP- K+ >7.5>>> 4.6, Mg- 1.6, BGL 288  ## Behavioral / Environmental:  -- Verbal redirection before Agitation Protocol  ##Legal Status   Thank you for this consult request. Recommendations have been communicated to the primary team.  We will continue to follow at this time.   PGY-2 Freida Busman, MD   NEW vs history  Relevant Aspects of Hospital Course:  Admitted on 04/05/2021 for DKA.  Patient Report:  Pt was seen in late afternoon and awoken from a nap. She is slow to respond but knows we are in [place]. When offered options for location, states  we are in a bank. Thinks it is 2022 but knows it is March. Patient reports she has been sleeping "a little bit". Patient nods when asked if she has been swinging at people while afraid; unable to state what she is afraid of. Patient reports that she is swinging because she doesn't know what staff is about to do to her. Patient reports that  she gets more afraid at night. Mood today is "so far pretty good". Before she came into the hospital patient reports that she thinks her mood was ok. Patient reports she has been more tired for the last couple of weeks.  Patient reports that she has been feeling hopeless, worthless, for almost a week.  Unable to state what she does for fun - although does have some friends in [place]. Patient denies SI and becomes tearful when this is asked. Patient talks through her tears adamant that she would never want to hurt herself, "I like being alive." Patient is reassured multiple times that this is a routine safety question. Patient denies HI and AH. Patient endorses [place] reporting that she has been seeing "people and animals" and endorses that she knows others can't see them, because she will see them one moment and not the next.   Patient reports that she does not do much day to day but does do some of the cooking in the home. Patient reports her daughter lives in the home with her. Patient report she takes care of diabetes with her daughter. Patient reports that she feels that her daughter is disrespectful and telling what she can and cannot eat. Patient reports she has no problems with her appetite, but does not like how she feels her daughter talks to her.   Patient  states she is in the hospital because she had 3 strokes. Patient does not remember talking about her diabetes with doctors. Becomes tearful when asked about being nervous - afraid of what "the doctors" are going to do to her. Patient endorses that she is very scared.   Patient reports that her daughter visited earlier today. Patient reports that she feels more comfortable when daughter is here but sometimes daughter has a bad attitude (denies any abuse). Has been feeling like that for maybe a week.       Chart review:   Received 2 5 mg haldol injections 3/8.    Collateral information:  Had seen a psychiatrist in [place]. Was given some  medication to "calm her down" - clarifies this was because of anger not anxiety. States she has not slept in some time (does have a hx of 1-2 nights without sleep in her 20s).  Psychiatric History:  Information collected from EMR and patient. Previous Meds for depression: Celexa '10mg'$  (failed) Zoloft '25mg'$ , Wellbutrin XL '300mg'$ , Effexor XR '150mg'$  (failed), Prozac '20mg'$ Trazodone '25mg'$  QHS PRN Prior OP Psych: Dr. Valeda Malm in [place] in 2016-2018   Family psych history:  Sister: Anxiety disorder   Social History:  Smokes 1/1 ppd. No EtOH. No marijuana - some years and years ago. No cociane, heroin, meth.   Family History:  The patient's family history includes Anxiety disorder in her sister; Breast cancer in her maternal aunt.  Medical History: Past Medical History:  Diagnosis Date   Cancer ([place])    Collagen vascular disease (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    Surgical History: Past Surgical History:  Procedure Laterality Date   COLECTOMY WITH COLOSTOMY CREATION/HARTMANN PROCEDURE N/A 03/10/2021   Procedure: COLECTOMY WITH COLOSTOMY CREATION/HARTMANN PROCEDURE;  Surgeon: Jules Husbands, MD;  Location: [facility] ORS;  Service: General;  Laterality: N/A;   PANCREATECTOMY     spleenectomy      Medications:   Current Facility-Administered Medications:    acetaminophen (TYLENOL) tablet 650 mg, 650 mg, Oral, Q6H PRN, 650 mg at 04/17/21 0659 **OR** acetaminophen (TYLENOL) suppository 650 mg, 650 mg, Rectal, Q6H PRN, Annita Brod, MD   albuterol (VENTOLIN HFA) 108 (90 Base) MCG/ACT inhaler 1-2 puff, 1-2 puff, Inhalation, Q4H PRN, Annita Brod, MD   atorvastatin (LIPITOR) tablet 40 mg, 40 mg, Oral, QHS, [place] Eric J, DO, 40 mg at 04/18/21 2151   camphor-menthol (SARNA) lotion, , Topical, PRN,  Annita Brod, MD, Given at 04/10/21 2340   clonazePAM (KLONOPIN) disintegrating tablet 0.5 mg, 0.5 mg, Oral, BID, [place] Eric J, DO, 0.5 mg at 04/20/21 0936   dextrose 5 %-0.45 % sodium chloride infusion, , Intravenous, Continuous, Shalhoub, Sherryll Burger, MD, Last Rate: 75 mL/hr at 04/20/21 0640, New Bag at 04/20/21 0640   dextrose 50 % solution 0-50 mL, 0-50 mL, Intravenous, PRN, Annita Brod, MD, 25 mL at 04/19/21 1833   divalproex (DEPAKOTE) DR tablet 250 mg, 250 mg, Oral, Q12H, [place] Eric J, DO, 250 mg at 04/20/21 0936   enoxaparin (LOVENOX) 100 mg/mL injection 20 mg, 20 mg, Subcutaneous, Q24H, Gevena Barre K, MD, 20 mg at 04/20/21 9233   feeding supplement (GLUCERNA SHAKE) (GLUCERNA SHAKE) liquid 237 mL, 237 mL, Oral, TID BM, Annita Brod, MD, 237 mL at 04/19/21 1222   Gerhardt's butt cream, , Topical, QID, [place] Donnamarie Poag, DO, Given at 04/20/21 0830   haloperidol lactate (HALDOL) injection 5 mg, 5 mg, Intravenous, Q6H PRN, [place] Donnamarie Poag, DO, 5 mg at 04/19/21 2109   hydrOXYzine (ATARAX) tablet 50 mg, 50 mg, Oral, Q6H PRN, [place] Donnamarie Poag, DO, 50 mg at 04/19/21 2338   insulin aspart (novoLOG) injection 0-9 Units, 0-9 Units, Subcutaneous, Q4H, Shalhoub, Sherryll Burger, MD, 9 Units at 04/20/21 1308   lipase/protease/amylase (CREON) capsule 72,000 Units, 72,000 Units, Oral, TID with meals, Annita Brod, MD, 72,000 Units at 04/20/21 1308   LORazepam (ATIVAN) injection 2 mg, 2 mg, Intravenous, Q6H PRN, Annita Brod, MD, 2 mg at 04/19/21 1101   melatonin tablet 6 mg, 6 mg, Oral, QHS, [place] Eric J, DO, 6 mg at 04/19/21 2253   multivitamin with minerals tablet 1 tablet, 1 tablet, Oral, Daily, Hosie Poisson, MD, 1 tablet at 04/20/21 0936   polyethylene glycol (MIRALAX / GLYCOLAX) packet 17 g, 17 g, Per Tube, Daily PRN, Annita Brod, MD   QUEtiapine (SEROQUEL) tablet 25 mg, 25 mg, Oral, Q breakfast, [place] Donnamarie Poag, DO, 25 mg at 04/20/21 0936   QUEtiapine (SEROQUEL)  tablet 50 mg, 50 mg, Oral, QHS, [place] Eric J, DO, 50 mg at 04/19/21 2253   rOPINIRole (REQUIP) tablet 0.5 mg, 0.5 mg, Oral, QHS, [place] Donnamarie Poag, DO, 0.5 mg at 04/19/21 2253   thiamine tablet 100 mg, 100 mg, Oral, Daily, [place] Donnamarie Poag, DO, 100 mg at 04/20/21 0076  Allergies: Allergies  Allergen Reactions   Cephalosporins Itching    TOLERATED ZOSYN (PIPERACILLIN) BEFORE   Hydromorphone Itching   Keflin [Cephalothin] Itching   Lactose Intolerance (Gi) Diarrhea  Objective  Vital signs:  Temp:  [97.5 F (36.4 C)-99.1 F (37.3 C)] 98.9 F (37.2 C) (03/09 1125) Pulse Rate:  [68-103] 88 (03/09 1125) Resp:  [12-19] 16 (03/09 1125) BP: (108-134)/(74-96) 134/89 (03/09 1125) SpO2:  [92 %-100 %] 99 % (03/09 1125)  Psychiatric Specialty Exam:  Presentation  General Appearance: Appropriate for Environment  Eye Contact:Minimal  Speech:Clear and Coherent; Slow  Speech Volume:Decreased  Handedness:No data recorded  Mood and Affect  Mood:Dysphoric; Anxious  Affect:Congruent   Thought Process  Thought Processes:Goal Directed  Descriptions of Associations:Circumstantial  Orientation:-- (Oriented to person, city, month not year or situation)  Thought Content:Perseveration  History of Schizophrenia/Schizoaffective disorder:No data recorded Duration of Psychotic Symptoms:No data recorded Hallucinations:Hallucinations: Visual Description of Visual Hallucinations: "i see people that aren't really there and animals."  Ideas of Reference:None  Suicidal Thoughts:Suicidal Thoughts: No  Homicidal Thoughts:Homicidal Thoughts: No   Sensorium  Memory:Immediate Poor; Recent Poor; Remote Fair  Judgment:Impaired  Insight:Shallow   Executive Functions  Concentration:Fair  Attention Span:Poor  Recall:No data recorded Fund of Knowledge:Fair  Language:Fair   Psychomotor Activity  Psychomotor Activity:Psychomotor Activity: Decreased   Assets   Assets:Resilience; Armed forces logistics/support/administrative officer; Desire for Improvement; Housing   Sleep  Sleep:Sleep: Poor    Physical Exam: Physical Exam Constitutional:      Comments: Arouses to verbal stimuli  Pulmonary:     Effort: Pulmonary effort is normal.   Review of Systems  Psychiatric/Behavioral:  Positive for hallucinations. Negative for suicidal ideas. The patient has insomnia.   Blood pressure 134/89, pulse 88, temperature 98.9 F (37.2 C), temperature source Oral, resp. rate 16, height 5' (1.524 m), weight 42.3 kg, SpO2 99 %. Body mass index is 18.21 kg/m.

## 2021-04-20 NOTE — Progress Notes (Signed)
Occupational Therapy Treatment ?Patient Details ?Name: Brittany [name] ?MRN: [id number] ?DOB: 14-Apr-1960 ?Today's Date: 04/20/2021 ? ? ?History of present illness Shulamis Wenberg is a 11yoF who was brought to the ED for confusion plus DKA.  EEG noted subclinical seizure.  Also found to have incidental COVID. 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 Pt admitted with diverticulitis of intestine with abscess. ?  ?OT comments ? Pt making incremental progress this session. She was able to follow 75% of simple commands, requiring min to mod verbal cuing to attend to tasks. With transfers, pt is able to power up to standing with min guard for steadying, however due to poor dynamic balance she requires min A to finish transfers. Continuing to recommend SNF level therapies to maximize independence and address cognitive compensatory strategies. OT will follow acutely.   ? ?Recommendations for follow up therapy are one component of a multi-disciplinary discharge planning process, led by the attending physician.  Recommendations may be updated based on patient status, additional functional criteria and insurance authorization. ?   ?Follow Up Recommendations ? Skilled nursing-short term rehab (<3 hours/day)  ?  ?Assistance Recommended at Discharge Frequent or constant Supervision/Assistance  ?Patient can return home with the following ? A little help with walking and/or transfers;A little help with bathing/dressing/bathroom;Assistance with cooking/housework;Assistance with feeding;Direct supervision/assist for medications management;Direct supervision/assist for financial management;Assist for transportation;Help with stairs or ramp for entrance ?  ?Equipment Recommendations ? None recommended by OT  ?  ?Recommendations for Other Services   ? ?  ?Precautions / Restrictions Precautions ?Precautions:  Fall ?Precaution Comments: seizure, urinary incontinence, colostomy bag frequently comes off ?Restrictions ?Weight Bearing Restrictions: No  ? ? ?  ? ?Mobility Bed Mobility ?Overal bed mobility: Modified Independent ?  ?  ?  ?  ?  ?  ?General bed mobility comments: No assist to come to sitting or returning to supine at end of session ?  ? ?Transfers ?Overall transfer level: Needs assistance ?Equipment used: 1 person hand held assist ?Transfers: Sit to/from Stand, Bed to chair/wheelchair/BSC ?Sit to Stand: Min guard ?  ?  ?Step pivot transfers: Min assist ?  ?  ?General transfer comment: Min guard to stand for balance safety, Min A for dynamic standing balance with taking small steps for transfer. ?  ?  ?Balance Overall balance assessment: Needs assistance ?Sitting-balance support: No upper extremity supported, Feet supported ?Sitting balance-Leahy Scale: Good ?  ?  ?Standing balance support: Single extremity supported, During functional activity ?Standing balance-Leahy Scale: Poor ?Standing balance comment: reliant on external support ?  ?  ?  ?  ?  ?  ?  ?  ?  ?  ?  ?   ? ?ADL either performed or assessed with clinical judgement  ? ?ADL Overall ADL's : Needs assistance/impaired ?Eating/Feeding: Supervision/ safety;Sitting ?Eating/Feeding Details (indicate cue type and reason): Feeding herself lunch at end of session. Refusing to use spoon or fork, instead used knife and fingers to scoop up fish and mashed potatoes ?Grooming: Wash/dry hands;Wash/dry face;Set up;Sitting ?Grooming Details (indicate cue type and reason): completed EOB ?  ?  ?  ?  ?  ?  ?  ?  ?Toilet Transfer: Minimal assistance;Stand-pivot ?Toilet Transfer Details (indicate cue type and reason): Pt able to stand with min guard, requiring min A to complete pivot/small steps due to poor balance ?  ?  ?  ?  ?  ?  General ADL Comments: Pt requiring min to mod verbal cuing to attend to tasks this session ?  ? ?Extremity/Trunk Assessment   ?  ?  ?  ?  ?   ? ?Vision   ?  ?  ?Perception   ?  ?Praxis   ?  ? ?Cognition Arousal/Alertness: Awake/alert ?Behavior During Therapy: Impulsive, Agitated ?Overall Cognitive Status: No family/caregiver present to determine baseline cognitive functioning ?Area of Impairment: Safety/judgement, Awareness, Problem solving ?  ?  ?  ?  ?  ?  ?  ?  ?  ?  ?  ?  ?Safety/Judgement: Decreased awareness of safety, Decreased awareness of deficits ?Awareness: Emergent ?Problem Solving: Slow processing, Requires verbal cues, Requires tactile cues ?General Comments: Pt slow to respond, at times, requiring increased cuing, perseverating on getting fish for lunch. ?  ?  ?   ?Exercises   ? ?  ?Shoulder Instructions   ? ? ?  ?General Comments VSS on RA  ? ? ?Pertinent Vitals/ Pain       Pain Assessment ?Pain Assessment: No/denies pain ? ?Home Living   ?  ?  ?  ?  ?  ?  ?  ?  ?  ?  ?  ?  ?  ?  ?  ?  ?  ?  ? ?  ?Prior Functioning/Environment    ?  ?  ?  ?   ? ?Frequency ? Min 2X/week  ? ? ? ? ?  ?Progress Toward Goals ? ?OT Goals(current goals can now be found in the care plan section) ? Progress towards OT goals: Progressing toward goals ? ?Acute Rehab OT Goals ?Patient Stated Goal: To get 3 pieces of chicken ?OT Goal Formulation: With patient ?Time For Goal Achievement: 04/25/21 ?Potential to Achieve Goals: Good ?ADL Goals ?Pt Will Perform Upper Body Dressing: Independently;sitting ?Pt Will Perform Lower Body Dressing: with modified independence;sit to/from stand ?Pt Will Transfer to Toilet: ambulating;with modified independence ?Additional ADL Goal #1: Pt will locate 3/3 obljects placed to her R for grooming independently.  ?Plan Discharge plan remains appropriate   ? ?Co-evaluation ? ? ?   ?  ?  ?  ?  ? ?  ?AM-PAC OT "6 Clicks" Daily Activity     ?Outcome Measure ? ? Help from another person eating meals?: A Little ?Help from another person taking care of personal grooming?: A Little ?Help from another person toileting, which includes using toliet,  bedpan, or urinal?: A Little ?Help from another person bathing (including washing, rinsing, drying)?: A Little ?Help from another person to put on and taking off regular upper body clothing?: A Little ?Help from another person to put on and taking off regular lower body clothing?: A Little ?6 Click Score: 18 ? ?  ?End of Session   ? ?OT Visit Diagnosis: Unsteadiness on feet (R26.81);Other abnormalities of gait and mobility (R26.89);Muscle weakness (generalized) (M62.81) ?  ?Activity Tolerance Patient tolerated treatment well ?  ?Patient Left in bed;with call bell/phone within reach;with bed alarm set;with restraints reapplied ?  ?Nurse Communication Mobility status ?  ? ?   ? ?Time: 4801-6553 ?OT Time Calculation (min): 26 min ? ?Charges: OT General Charges ?$OT Visit: 1 Visit ?OT Treatments ?$Self Care/Home Management : 8-22 mins ?$Therapeutic Activity: 8-22 mins ? ?Trew Sunde H., OTR/L ?Acute Rehabilitation ? ?Statia Burdick Elane Yolanda Bonine ?04/20/2021, 2:48 PM ?

## 2021-04-20 NOTE — Progress Notes (Addendum)
PROGRESS NOTE    Brittany [name]  ELF:[id number] DOB: 11/09/1960 DOA: 04/05/2021  PCP: [facility], [facility]    Brief Narrative:  Brittany [name] is a 62 year old female with PMH significant for hypertension, diabetes mellitus, CVA with right-sided weakness, COPD, tobacco abuse, and status post pancreatectomy and splenectomy, anxiety/depression, discoid lupus erythematosus, hyperlipidemia who presented to [facility] ED on 2/22 via EMS with confusion.    Per her daughter at baseline, patient is oriented x 3. She has a history of stroke with right-sided weakness. Normally pt can use a cane to walk. This AM, pt was found to be on the floor with confusion. Pt has jerking and slurred speech per her daughter.    Per EDP, EMS reported that the house was extremely filthy. The patient appeared to be in a soiled diaper. Pt has an colostomy with no collection bag in place. Daughter did not know which medications the patient was on.  EMS states they witnessed what could be a partial seizure, and gave the patient a total of 4 mg of intranasal Versed in route to the hospital. When I saw pt in ED. Pt was confused, not orientated x3. She has right-sided weakness which is likely from previous stroke.   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 Pt does not seem to have chest pain.  Not sure if patient has symptoms of UTI.  Her daughter is not sure if patient has a history of seizure.  Daughter states that she found a Keppra bottle which is about 63 years old, not sure if patient is taking his medications or not.   Patient was noted to have DKA, subclinical seizure on EEG and incidental finding of COVID-19 viral infection.  Neurology was consulted and patient was transferred to [facility] for continuous EEG.  Admitted to the hospital service.  Now DKA resolved.  Patient is off isolation of COVID-19.  Psychiatry consulted for agitation recommended Keppra should be changed to  Depakote.     Assessment and Plan: * New onset seizure ([place]) Suspect due to lowered seizure threshold, precipitated by DKA/COVID. Neurology was consulted and followed during initial hospital course.   Discussed with psychiatry, given behavioral disturbances may be related to Keppra use, Keppra discontinued in favor of Depakote. Continue Depakote 250 mg twice daily Follow-up Depakote level. Outpatient follow-up with neurology in 2 to 3 months.  Acute metabolic encephalopathy Improved since admission, but seems to be waxing and waning.   Likely complicated by hospital-acquired delirium and fluctuations in glucose due to noncompliance with diet.   She is working with physical and Occupational Therapy. She will need to be discharged to SNF for rehab and then may need to live with her sister as she is likely unable to manage her ADLs at home alone.  DKA (diabetic ketoacidosis) ([place]) DKA resolved.  Hemoglobin A1c 15.0 consistent with noncompliance. Now transitioned to subcu insulin.  Diabetic coordinator following up.  Type 2 diabetes mellitus with hyperlipidemia (HCC) HbA1c 15.0> poor control. Suspect noncompliance. Continue Semglee 22 units daily. NovoLog 6 units TID AC Sliding scale for coverage   Essential hypertension Benazepril discontinued due to hyperkalemia. Resume once potassium improves.  Hypokalemia; hyperkalemia Initially repleted during this hospitalization.  Potassium increased.  Given Lokelma. Now potassium has improved.  Resume benazepril.   Restless leg syndrome Continue ropinirole.  Elevated troponin Etiology likely sec. to type II demand ischemia in the setting of DKA and seizure as above.  EKG with no  concerning dynamic changes.  No concerning arrhythmias noted on telemetry, now discontinued.    History of stroke Continue statin. Not on aspirin/Plavix outpatient  Hypomagnesemia Replaced.  Continue to monitor  COVID-19 virus infection Incidental  finding with+ PCR on 04/05/2021.   Completed 10 days of airborne/contact isolation, now discontinued on 3/5  Status post colostomy [facility]) Wound care consulted for continued colostomy recommendations while inpatient.  COPD, mild ([place]) Stable.  Currently on room air Continue albuterol MDI and 1-2 puffs every 4 hours.   Protein-calorie malnutrition, severe Nutrition Status: Nutrition Problem: Severe Malnutrition Etiology: chronic illness (cancer, lupus) Signs/Symptoms: severe muscle depletion, severe fat depletion, percent weight loss Percent weight loss: 16.5 % (x5 months) Seen by nutrition.  Regular diet with thin liquids.  Continue to encourage increased supplementation.  Pressure injury of skin Continue local wound care, offloading.  Behavior related to cognitive impairment Continue Seroquel '25mg'$  qAm abd '50mg'$  PO qHS Continue clonazepam 0.'5mg'$  BID Continue melatonin '6mg'$  PO qHS Discussed with psychiatry on 3/8, Keppra could be causing neuropsych side effects and recommend discontinuation of Keppra and starting Depakote as above.   Hyperkalemia-resolved as of 04/20/2021 Benazepril discontinued.  Now potassium improved. Discontinue Lokelma   DVT prophylaxis:   Lovenox.   Code Status: Full Code Family Communication: No family at bedside.  Disposition Plan:  Level of care: Med-Surg Status is: Inpatient Remains inpatient appropriate because: Pending SNF placement    Consultants:  Neurology - signed off 2/27  Procedures:  EEG  Antimicrobials:  None   Subjective: Patient was seen and examined at bedside.  Overnight events noted. She appears comfortable still remains on soft restraints and Posey band. Patient seems confused, appears sad but following commands.   Objective: Vitals:   04/19/21 1925 04/20/21 0306 04/20/21 0759 04/20/21 1125  BP: (!) 130/92 122/86 108/74 134/89  Pulse: 68 94 (!) 103 88  Resp: '19 18 17 16  '$ Temp: 97.6 F (36.4 C) 99 F (37.2 C) 99.1 F  (37.3 C) 98.9 F (37.2 C)  TempSrc:  Oral Oral Oral  SpO2: 100% 92% 97% 99%  Weight:      Height:        Intake/Output Summary (Last 24 hours) at 04/20/2021 1158 Last data filed at 04/20/2021 0900 Gross per 24 hour  Intake 240 ml  Output 1050 ml  Net -810 ml    Filed Weights   04/09/21 0500 04/13/21 0436 04/18/21 0500  Weight: 43.6 kg 43 kg 42.3 kg    Examination:  Physical Exam: General exam: Appears comfortable, confused, not in any distress. Respiratory : Clear to auscultation bilaterally, no wheezes, no crackles, normal respiratory effort. Cardiovascular : S1-S2 heard, regular rate and rhythm, no murmur. Gastrointestinal : Abdomen is soft, nontender, nondistended, colostomy bag noted with brown stool in the bag Central nervous system: Alert, oriented x1, confused, following commands.  Moves all extremities Extremities: No edema, no cyanosis, no clubbing. Psychiatry: Mood, insight, judgment appropriate Skin: stage II wounds noted left buttock likely from excoriation    Data Reviewed: I have personally reviewed following labs and imaging studies  CBC: Recent Labs  Lab 04/14/21 0543 04/16/21 2107 04/18/21 0004 04/20/21 0329  WBC 11.4* 11.9* 8.3 14.9*  HGB 11.7* 10.6* 9.7* 10.2*  HCT 35.3* 34.4* 29.9* 30.9*  MCV 89.1 93.5 91.4 89.0  PLT 348 515* 500* 440*   Basic Metabolic Panel: Recent Labs  Lab 04/14/21 0543 04/16/21 0352 04/16/21 0753 04/17/21 1027 04/17/21 1749 04/17/21 2115 04/18/21 0004 04/19/21 0750 04/20/21 0147  NA  132* 126*  --  132*  --  128* 132* 135 138  K 5.5* 6.1*   < > 5.7* 6.7* >7.5* 5.3* 4.7 4.6  CL 100 99  --  106  --  105 106 106 106  CO2 23 19*  --  20*  --  15* 18* 20* 23  GLUCOSE 82 400*  --  231*  --  440* 394* 62* 189*  BUN 19 33*  --  30*  --  29* 29* 19 16  CREATININE 0.62 0.85  --  0.79  --  0.72 0.68 0.46 0.54  CALCIUM 9.1 8.9  --  8.9  --  8.3* 8.4* 8.8* 8.9  MG 1.8 2.1  --   --   --   --  1.8  --  1.6*  PHOS  --   --    --   --   --   --  3.9  --   --    < > = values in this interval not displayed.   GFR: Estimated Creatinine Clearance: 49.3 mL/min (by C-G formula based on SCr of 0.54 mg/dL). Liver Function Tests: Recent Labs  Lab 04/18/21 0004 04/20/21 0147  AST 42* 50*  ALT 46* 51*  ALKPHOS 137* 119  BILITOT 0.5 0.3  PROT 7.0 6.7  ALBUMIN 2.2* 2.3*    No results for input(s): LIPASE, AMYLASE in the last 168 hours. No results for input(s): AMMONIA in the last 168 hours.  Coagulation Profile: No results for input(s): INR, PROTIME in the last 168 hours. Cardiac Enzymes: No results for input(s): CKTOTAL, CKMB, CKMBINDEX, TROPONINI in the last 168 hours. BNP (last 3 results) No results for input(s): PROBNP in the last 8760 hours. HbA1C: No results for input(s): HGBA1C in the last 72 hours. CBG: Recent Labs  Lab 04/19/21 2148 04/20/21 0603 04/20/21 0625 04/20/21 0642 04/20/21 1144  GLUCAP 259* 48* 54* 120* 389*   Lipid Profile: No results for input(s): CHOL, HDL, LDLCALC, TRIG, CHOLHDL, LDLDIRECT in the last 72 hours. Thyroid Function Tests: No results for input(s): TSH, T4TOTAL, FREET4, T3FREE, THYROIDAB in the last 72 hours. Anemia Panel: No results for input(s): VITAMINB12, FOLATE, FERRITIN, TIBC, IRON, RETICCTPCT in the last 72 hours. Sepsis Labs: Recent Labs  Lab 04/16/21 2107  LATICACIDVEN 1.3    Recent Results (from the past 240 hour(s))  Culture, blood (routine x 2)     Status: None (Preliminary result)   Collection Time: 04/16/21  9:26 PM   Specimen: BLOOD RIGHT ARM  Result Value Ref Range Status   Specimen Description BLOOD RIGHT ARM  Final   Special Requests   Final    BOTTLES DRAWN AEROBIC AND ANAEROBIC Blood Culture results may not be optimal due to an inadequate volume of blood received in culture bottles   Culture   Final    NO GROWTH 4 DAYS Performed at [facility], [place] [address], [place], [place] [address]    Report Status PENDING  Incomplete   Culture, blood (routine x 2)     Status: None (Preliminary result)   Collection Time: 04/16/21  9:30 PM   Specimen: BLOOD LEFT ARM  Result Value Ref Range Status   Specimen Description BLOOD LEFT ARM  Final   Special Requests   Final    BOTTLES DRAWN AEROBIC AND ANAEROBIC Blood Culture results may not be optimal due to an inadequate volume of blood received in culture bottles   Culture   Final    NO  GROWTH 4 DAYS Performed at [facility], [place] [address], [place], [place] [address]    Report Status PENDING  Incomplete   Radiology Studies: No results found.  Scheduled Meds:  atorvastatin  40 mg Oral QHS   clonazePAM  0.5 mg Oral BID   divalproex  250 mg Oral Q12H   enoxaparin (LOVENOX) injection  20 mg Subcutaneous Q24H   feeding supplement (GLUCERNA SHAKE)  237 mL Oral TID BM   Gerhardt's butt cream   Topical QID   insulin aspart  0-9 Units Subcutaneous Q4H   lipase/protease/amylase  72,000 Units Oral TID with meals   melatonin  6 mg Oral QHS   multivitamin with minerals  1 tablet Oral Daily   QUEtiapine  25 mg Oral Q breakfast   QUEtiapine  50 mg Oral QHS   rOPINIRole  0.5 mg Oral QHS   thiamine  100 mg Oral Daily   Continuous Infusions:  dextrose 5 % and 0.45% NaCl 75 mL/hr at 04/20/21 0640      LOS: 15 days    Time spent: 50 mins   Belem Hintze, MD [facility] Available via Epic secure chat 7am-7pm After these hours, please refer to coverage provider listed on [email or web address] 04/20/2021, 11:58 AM

## 2021-04-20 NOTE — Progress Notes (Signed)
Physical Therapy Treatment ?Patient Details ?Name: Brittany [name] ?MRN: [id number] ?DOB: March 22, 1960 ?Today's Date: 04/20/2021 ? ? ?History of Present Illness Brittany [name] is a 82yoF who was brought to the ED for confusion plus DKA.  EEG noted subclinical seizure.  Also found to have incidental COVID. 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 Pt admitted with diverticulitis of intestine with abscess. ? ?  ?PT Comments  ? ? Pt making steady progress with mobility. Continue to recommend SNF at DC due to physical and cognitive limitations.   ?Recommendations for follow up therapy are one component of a multi-disciplinary discharge planning process, led by the attending physician.  Recommendations may be updated based on patient status, additional functional criteria and insurance authorization. ? ?Follow Up Recommendations ? Skilled nursing-short term rehab (<3 hours/day) ?  ?  ?Assistance Recommended at Discharge Frequent or constant Supervision/Assistance  ?Patient can return home with the following Assistance with cooking/housework;Direct supervision/assist for medications management;Help with stairs or ramp for entrance;Assist for transportation;A lot of help with walking and/or transfers;A lot of help with bathing/dressing/bathroom ?  ?Equipment Recommendations ? None recommended by PT  ?  ?Recommendations for Other Services   ? ? ?  ?Precautions / Restrictions Precautions ?Precautions: Fall ?Precaution Comments: seizure, urinary incontinence, colostomy bag frequently comes off ?Restrictions ?Weight Bearing Restrictions: No  ?  ? ?Mobility ? Bed Mobility ?Overal bed mobility: Needs Assistance ?Bed Mobility: Supine to Sit, Sit to Supine ?  ?  ?Supine to sit: Supervision ?Sit to supine: Supervision ?  ?General bed mobility comments: supervision for safety and lines ?  ? ?Transfers ?Overall  transfer level: Needs assistance ?Equipment used: Rolling walker (2 wheels) ?Transfers: Sit to/from Stand ?Sit to Stand: Min guard ?  ?  ?  ?  ?  ?General transfer comment: Assist for safety and lines ?  ? ?Ambulation/Gait ?Ambulation/Gait assistance: Min assist ?Gait Distance (Feet): 110 Feet ?Assistive device: Rolling walker (2 wheels) ?Gait Pattern/deviations: Step-through pattern, Decreased stride length, Narrow base of support, Drifts right/left ?Gait velocity: decr ?Gait velocity interpretation: <1.31 ft/sec, indicative of household ambulator ?  ?General Gait Details: Verbal/tactile cues to correct veering to rt without success. Min assist to steer walker due to pt running into objects on rt consistently and unable to self correct. ? ? ?Stairs ?  ?  ?  ?  ?  ? ? ?Wheelchair Mobility ?  ? ?Modified Rankin (Stroke Patients Only) ?  ? ? ?  ?Balance Overall balance assessment: Needs assistance ?Sitting-balance support: No upper extremity supported, Feet supported ?Sitting balance-Leahy Scale: Good ?  ?  ?Standing balance support: Bilateral upper extremity supported, During functional activity ?Standing balance-Leahy Scale: Poor ?Standing balance comment: Walker and min guard for static standing ?  ?  ?  ?  ?  ?  ?  ?  ?  ?  ?  ?  ? ?  ?Cognition Arousal/Alertness: Awake/alert ?Behavior During Therapy: [facility] for tasks assessed/performed ?Overall Cognitive Status: No family/caregiver present to determine baseline cognitive functioning ?Area of Impairment: Safety/judgement, Awareness, Problem solving, Following commands ?  ?  ?  ?  ?  ?  ?  ?  ?  ?  ?  ?Following Commands: Follows one step commands with increased time ?Safety/Judgement: Decreased awareness of safety, Decreased awareness of deficits ?Awareness: Emergent ?Problem Solving: Slow processing, Decreased initiation, Difficulty sequencing, Requires verbal cues ?  ?  ?  ? ?  ?  Exercises   ? ?  ?General Comments   ?  ?  ? ?Pertinent Vitals/Pain Pain  Assessment ?Pain Assessment: Faces ?Faces Pain Scale: No hurt  ? ? ?Home Living   ?  ?  ?  ?  ?  ?  ?  ?  ?  ?   ?  ?Prior Function    ?  ?  ?   ? ?PT Goals (current goals can now be found in the care plan section) Progress towards PT goals: Progressing toward goals ? ?  ?Frequency ? ? ? Min 2X/week ? ? ? ?  ?PT Plan Current plan remains appropriate  ? ? ?Co-evaluation   ?  ?  ?  ?  ? ?  ?AM-PAC PT "6 Clicks" Mobility   ?Outcome Measure ? Help needed turning from your back to your side while in a flat bed without using bedrails?: A Little ?Help needed moving from lying on your back to sitting on the side of a flat bed without using bedrails?: A Little ?Help needed moving to and from a bed to a chair (including a wheelchair)?: A Little ?Help needed standing up from a chair using your arms (e.g., wheelchair or bedside chair)?: A Little ?Help needed to walk in hospital room?: A Little ?Help needed climbing 3-5 steps with a railing? : A Lot ?6 Click Score: 17 ? ?  ?End of Session Equipment Utilized During Treatment: Gait belt ?Activity Tolerance: Patient tolerated treatment well ?Patient left: with call bell/phone within reach;in bed;with bed alarm set;with family/visitor present ?Nurse Communication: Mobility status ?PT Visit Diagnosis: Unsteadiness on feet (R26.81);Muscle weakness (generalized) (M62.81);Difficulty in walking, not elsewhere classified (R26.2);Ataxic gait (R26.0) ?  ? ? ?Time: 3220-2542 ?PT Time Calculation (min) (ACUTE ONLY): 18 min ? ?Charges:  $Gait Training: 8-22 mins          ?          ? ?[facility] PT ?Acute Rehabilitation Services ?Pager [phone] ?Office [phone] ? ? ? ?Shary Decamp [facility] ?04/20/2021, 2:40 PM ? ?

## 2021-04-20 NOTE — Progress Notes (Signed)
This RN notified by NT that pt's BG was 48 this AM.  Unable to give d50 due to patient's IV going bad.  Pt arousable and able to give juice and an ensure this AM.  Dr. Cyd Silence notified and IV team consult placed to get a working IV on patient.  Will continue to monitor.  ?

## 2021-04-21 LAB — CULTURE, BLOOD (ROUTINE X 2)
Culture: NO GROWTH
Culture: NO GROWTH

## 2021-04-21 LAB — GLUCOSE, CAPILLARY
Glucose-Capillary: 416 mg/dL — ABNORMAL HIGH (ref 70–99)
Glucose-Capillary: 302 mg/dL — ABNORMAL HIGH (ref 70–99)
Glucose-Capillary: 191 mg/dL — ABNORMAL HIGH (ref 70–99)
Glucose-Capillary: 424 mg/dL — ABNORMAL HIGH (ref 70–99)
Glucose-Capillary: 406 mg/dL — ABNORMAL HIGH (ref 70–99)
Glucose-Capillary: 200 mg/dL — ABNORMAL HIGH (ref 70–99)

## 2021-04-21 LAB — VALPROIC ACID LEVEL: Valproic Acid Lvl: 17 ug/mL — ABNORMAL LOW (ref 50.0–100.0)

## 2021-04-21 MED ORDER — SODIUM CHLORIDE 0.9% FLUSH: 10.0000 mL | INTRAVENOUS | Status: DC | PRN

## 2021-04-21 MED ORDER — INSULIN ASPART 100 UNIT/ML IJ SOLN
10.0000 [IU] | Freq: Once | INTRAMUSCULAR | Status: AC
  Administered 2021-04-21: 10 [IU] via SUBCUTANEOUS

## 2021-04-21 MED ORDER — INSULIN ASPART 100 UNIT/ML IJ SOLN
11.0000 [IU] | Freq: Once | INTRAMUSCULAR | Status: AC
  Administered 2021-04-21: 11 [IU] via SUBCUTANEOUS

## 2021-04-21 MED ORDER — SODIUM CHLORIDE 0.9 % IV SOLN: INTRAVENOUS | Status: DC

## 2021-04-21 MED ORDER — SODIUM CHLORIDE 0.9% FLUSH
10.0000 mL | Freq: Two times a day (BID) | INTRAVENOUS | Status: DC
  Administered 2021-04-21 – 2021-05-08 (×23): 10 mL

## 2021-04-21 MED ORDER — LEVETIRACETAM 250 MG PO TABS
250.0000 mg | ORAL_TABLET | Freq: Two times a day (BID) | ORAL | Status: DC
Start: 2021-04-21 — End: 2021-04-28
  Administered 2021-04-21 – 2021-04-28 (×15): 250 mg via ORAL
  Filled 2021-04-21 (×15): qty 1

## 2021-04-21 MED ORDER — CLONAZEPAM 0.25 MG PO TBDP
0.2500 mg | ORAL_TABLET | Freq: Two times a day (BID) | ORAL | Status: DC
  Administered 2021-04-21 – 2021-05-09 (×35): 0.25 mg via ORAL
  Filled 2021-04-21 (×36): qty 1

## 2021-04-21 NOTE — TOC Progression Note (Signed)
Transition of Care (TOC) - Progression Note  ? ? ?Patient Details  ?Name: Brittany [name] ?MRN: [id number] ?Date of Birth: Jul 01, 1960 ? ?Transition of Care (TOC) CM/SW Contact  ?Geralynn Ochs, LCSW ?Phone Number: ?04/21/2021, 10:18 AM ? ?Clinical Narrative:   CSW continuing to follow for SNF placement. Patient remains in restraints, can DC to SNF when restraint-free for 48 hours. CSW to follow. ? ? ? ?Expected Discharge Plan: [place] ?Barriers to Discharge: Continued Medical Work up, [facility] will not accept until restraint criteria met ? ?Expected Discharge Plan and Services ?Expected Discharge Plan: [place] ?  ?  ?  ?  ?                ?  ?  ?  ?  ?  ?  ?  ?  ?  ?  ? ? ?Social Determinants of Health (SDOH) Interventions ?  ? ?Readmission Risk Interventions ?Readmission Risk Prevention Plan 03/06/2021 02/16/2021  ?Transportation Screening Complete Complete  ?Medication Review Press photographer) Complete Complete  ?PCP or Specialist appointment within 3-5 days of discharge Complete -  ?SW Recovery Care/Counseling Consult - Complete  ?Palliative Care Screening Not Applicable Not Applicable  ?[place] Not Applicable Not Applicable  ?Some recent data might be hidden  ? ? ?

## 2021-04-21 NOTE — Progress Notes (Signed)

## 2021-04-21 NOTE — Progress Notes (Signed)
Brittany [name] Face-to-Face Psychiatric Evaluation   Service Date: April 21, 2021 LOS:  LOS: 16 days    Assessment  Brittany [name] is a 61 y.o. female admitted medically for 04/05/2021  9:28 PM for DKA. She carries the psychiatric diagnoses of  and has a past medical history of MDD and anxiety.Psychiatry was consulted for agitation and medication mgmt by Eric [place] MD.     Her current presentation of waxing and waning agitation is most consistent with delirium. Patient will require a reassessment in the AM. Currently her Outpatient psychotropic medication are unknown but may include Remeron '15mg'$  QHS and gabapentin '300mg'$  TID. It is unknown if patient is compliant. On initial examination, patient patient is pleasant disoriented but redirectable and attempting participate in assessment. Please see plan below for detailed recommendations.   Patient appears to benefit from starting Remeron and Depakote as her night time behavior improved. Patient was a bit upset today about family dynamics; however this is situational and less likely to be affected by medication.  Diagnoses:  Active Hospital problems: Principal Problem:   New onset seizure ([place]) Active Problems:   COPD, mild (HCC)   Type 2 diabetes mellitus with hyperlipidemia ([place])   Essential hypertension   Restless leg syndrome   DKA (diabetic ketoacidosis) (HCC)   Hypokalemia; hyperkalemia   Acute metabolic encephalopathy   Protein-calorie malnutrition, severe   Elevated troponin   History of stroke   Hypomagnesemia   COVID-19 virus infection   Status post colostomy ([place])   Pressure injury of skin   Behavior related to cognitive impairment     Plan  ## Safety and Observation Level:  - Based on my clinical evaluation, I estimate the patient to be at low risk of self harm in the current setting - At this time, we recommend a routine level of observation. This decision is based on my review of the chart  including patient's history and current presentation, interview of the patient, mental status examination, and consideration of suicide risk including evaluating suicidal ideation, plan, intent, suicidal or self-harm behaviors, risk factors, and protective factors. This judgment is based on our ability to directly address suicide risk, implement suicide prevention strategies and develop a safety plan while the patient is in the clinical setting. Please contact our team if there is a concern that risk level has changed.     ## Medications:  -- Continue Remeron 7.'5mg'$  QHS -- Continue Depakote '250mg'$  BID -- Melatonin '6mg'$  -- Attempted to reach out to daughter 2x, let a HIPPA complaint message will try again tom.   ## Medical Decision Making Capacity:  -- Not formally assessed   ## Further Work-up:  -- Recommend UA     -- NO EKG this hospitalization -- Pertinent labwork reviewed earlier this admission includes:    ## Disposition:  -- CBC- WBC 14.9/Hgb 10.2/ PLT 584, CMP- K+ >7.5>>> 4.6, Mg- 1.6, BGL 288   ## Behavioral / Environmental:  -- Verbal redirection before Agitation Protocol   ##Legal Status     Thank you for this consult request. Recommendations have been communicated to the primary team.  We will continue to follow at this time.    PGY-2  Brittany Busman, MD    [name] history  Relevant Aspects of Hospital Course:  Admitted on 04/05/2021 for DKA.   3/10- Overnight patient not noted to have episode or require PRN for agitation.  Patient Report:  Upon entering room patient appears to be squeezing butter  into her tea and eating her lunch. Provider made patient aware that she was trying to poor butter into her tea, patient then looked around her tray to find the sugar which was in a similar color (yellow) casing, which she did empty into her tea after putting the butter down. Patient endorsed that she had a good appetite. Patient was Aox3 on assessment. Patient endorsed that  she slept well. Patient endorsed that she had been enjoying looking out the window today. Patient reports that she has not had any phone calls or visitors today. Patient suddenly becomes tearful when her daughter is mentioned and patient endorses that she feels neglected today. Patient reports that she tried to call her daughter this AM and got no answer. Patient reports that she thinks her daughter does not want to talk to her and this upsets patient, "she is all I have."  Patient was able to recall being treated for depression in the past and endorsed that when she was depressed in the past she was unmotivated.   Patient denied hx concerning for hx of manic or hypomanic episodes.    Collateral information:  Had seen a psychiatrist in [place]. Was given some medication to "calm her down" - clarifies this was because of anger not anxiety. States she has not slept in some time (does have a hx of 1-2 nights without sleep in her 20s).   Psychiatric History:  Information collected from EMR and patient. Previous Meds for depression: Celexa '10mg'$  (failed) Zoloft '25mg'$ , Wellbutrin XL '300mg'$ , Effexor XR '150mg'$  (failed), Prozac '20mg'$ Trazodone '25mg'$  QHS PRN Prior OP Psych: Dr. Valeda Malm in [place] in 2016-2018     Family psych history:  Sister: Anxiety disorder     Social History:  Smokes 1/1 ppd. No EtOH. No marijuana - some years and years ago. No cociane, heroin, meth.    The patient's family history includes Anxiety disorder in her sister; Breast cancer in her maternal aunt.  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[place]) 07/17/2019    Surgical History: Past Surgical History:  Procedure Laterality Date   COLECTOMY WITH COLOSTOMY  CREATION/HARTMANN PROCEDURE N/A 03/10/2021   Procedure: COLECTOMY WITH COLOSTOMY CREATION/HARTMANN PROCEDURE;  Surgeon: Jules Husbands, MD;  Location: [facility] ORS;  Service: General;  Laterality: N/A;   PANCREATECTOMY     spleenectomy      Medications:   Current Facility-Administered Medications:    0.9 %  sodium chloride infusion, , Intravenous, Continuous, Shawna Clamp, MD, Last Rate: 75 mL/hr at 04/21/21 0955, New Bag at 04/21/21 0955   acetaminophen (TYLENOL) tablet 650 mg, 650 mg, Oral, Q6H PRN, 650 mg at 04/17/21 0659 **OR** acetaminophen (TYLENOL) suppository 650 mg, 650 mg, Rectal, Q6H PRN, Annita Brod, MD   albuterol (VENTOLIN HFA) 108 (90 Base) MCG/ACT inhaler 1-2 puff, 1-2 puff, Inhalation, Q4H PRN, Annita Brod, MD   atorvastatin (LIPITOR) tablet 40 mg, 40 mg, Oral, QHS, [place] Donnamarie Poag, DO, 40 mg at 04/20/21 2120   camphor-menthol (SARNA) lotion, , Topical, PRN, Annita Brod, MD, Given at 04/10/21 2340   clonazePAM (KLONOPIN) disintegrating tablet 0.25 mg, 0.25 mg, Oral, BID, Kacy Hegna B, MD   dextrose 50 % solution  0-50 mL, 0-50 mL, Intravenous, PRN, Annita Brod, MD, 25 mL at 04/19/21 1833   divalproex (DEPAKOTE) DR tablet 250 mg, 250 mg, Oral, Q12H, [place] Donnamarie Poag, DO, 250 mg at 04/21/21 0813   enoxaparin (LOVENOX) 100 mg/mL injection 20 mg, 20 mg, Subcutaneous, Q24H, Gevena Barre K, MD, 20 mg at 04/21/21 0953   feeding supplement (GLUCERNA SHAKE) (GLUCERNA SHAKE) liquid 237 mL, 237 mL, Oral, TID BM, Annita Brod, MD, 237 mL at 04/19/21 1222   Gerhardt's butt cream, , Topical, QID, [place] Donnamarie Poag, DO, Given at 04/21/21 1316   haloperidol lactate (HALDOL) injection 5 mg, 5 mg, Intravenous, Q6H PRN, [place] Donnamarie Poag, DO, 5 mg at 04/19/21 2109   hydrOXYzine (ATARAX) tablet 50 mg, 50 mg, Oral, Q6H PRN, [place] Donnamarie Poag, DO, 50 mg at 04/19/21 2338   insulin aspart (novoLOG) injection 0-9 Units, 0-9 Units, Subcutaneous, Q4H, Shalhoub, Sherryll Burger, MD,  2 Units at 04/21/21 1316   levETIRAcetam (KEPPRA) tablet 250 mg, 250 mg, Oral, BID, Shawna Clamp, MD, 250 mg at 04/21/21 0813   lipase/protease/amylase (CREON) capsule 72,000 Units, 72,000 Units, Oral, TID with meals, Annita Brod, MD, 72,000 Units at 04/21/21 1316   LORazepam (ATIVAN) injection 2 mg, 2 mg, Intravenous, Q6H PRN, Annita Brod, MD, 2 mg at 04/19/21 1101   melatonin tablet 6 mg, 6 mg, Oral, QHS, [place] Donnamarie Poag, DO, 6 mg at 04/20/21 2120   mirtazapine (REMERON SOL-TAB) disintegrating tablet 7.5 mg, 7.5 mg, Oral, QHS, Simonne Boulos B, MD, 7.5 mg at 04/20/21 2126   multivitamin with minerals tablet 1 tablet, 1 tablet, Oral, Daily, Hosie Poisson, MD, 1 tablet at 04/21/21 0813   polyethylene glycol (MIRALAX / GLYCOLAX) packet 17 g, 17 g, Per Tube, Daily PRN, Annita Brod, MD   rOPINIRole (REQUIP) tablet 0.5 mg, 0.5 mg, Oral, QHS, [place] Donnamarie Poag, DO, 0.5 mg at 04/20/21 2121   sodium chloride flush (NS) 0.9 % injection 10-40 mL, 10-40 mL, Intracatheter, Q12H, Shawna Clamp, MD, 10 mL at 04/21/21 4163   sodium chloride flush (NS) 0.9 % injection 10-40 mL, 10-40 mL, Intracatheter, PRN, Shawna Clamp, MD   thiamine tablet 100 mg, 100 mg, Oral, Daily, [place] Donnamarie Poag, DO, 100 mg at 04/21/21 8453  Allergies: Allergies  Allergen Reactions   Cephalosporins Itching    TOLERATED ZOSYN (PIPERACILLIN) BEFORE   Hydromorphone Itching   Keflin [Cephalothin] Itching   Lactose Intolerance (Gi) Diarrhea       Objective  Vital signs:  Temp:  [98.3 F (36.8 C)-99.8 F (37.7 C)] 98.6 F (37 C) (03/10 1229) Pulse Rate:  [94-120] 104 (03/10 1229) Resp:  [18-20] 18 (03/10 1229) BP: (113-143)/(76-102) 114/102 (03/10 1229) SpO2:  [97 %-99 %] 97 % (03/10 1229) Weight:  [43 kg] 43 kg (03/10 0500)  Psychiatric Specialty Exam:  Presentation  General Appearance: -- (Up to chair eating, looks more sullen today)  Eye Contact:Minimal  Speech:Clear and Coherent  Speech  Volume:Decreased  Handedness:No data recorded  Mood and Affect  Mood:Dysphoric  Affect:Flat   Thought Process  Thought Processes:Linear  Descriptions of Associations:Circumstantial  Orientation:Full (Time, Place and Person)  Thought Content:Perseveration  History of Schizophrenia/Schizoaffective disorder:No data recorded Duration of Psychotic Symptoms:No data recorded Hallucinations:Hallucinations: Visual Description of Visual Hallucinations: "i see people that aren't really there and animals."  Ideas of Reference:None  Suicidal Thoughts:Suicidal Thoughts: No  Homicidal Thoughts:Homicidal Thoughts: No   Sensorium  Memory:Immediate Fair; Recent Fair  Judgment:Impaired  Insight:Shallow   Community education officer  Concentration:Fair  Attention Span:Poor  Recall:No data recorded [facility] [place]   Psychomotor Activity  Psychomotor Activity:Psychomotor Activity: Decreased   Assets  Assets:Resilience   Sleep  Sleep:Sleep: Fair    Physical Exam: Physical Exam HENT:     Head: Normocephalic and atraumatic.  Pulmonary:     Effort: Pulmonary effort is normal.  Neurological:     Mental Status: She is alert and oriented to person, place, and time.   Review of Systems  Psychiatric/Behavioral:  Positive for depression and hallucinations. Negative for suicidal ideas.   Blood pressure (!) 114/102, pulse (!) 104, temperature 98.6 F (37 C), temperature source Oral, resp. rate 18, height 5' (1.524 m), weight 43 kg, SpO2 97 %. Body mass index is 18.51 kg/m.

## 2021-04-21 NOTE — Progress Notes (Signed)
Pt. Continues to need Telesitter and posey belt restraint. She has been pulling at IV's, pulling at colostomy bag and playing with contents. MD aware and orders renewed. ?

## 2021-04-21 NOTE — Progress Notes (Signed)
PROGRESS NOTE    Brittany [name]  IZT:[id number] DOB: 1960/11/21 DOA: 04/05/2021  PCP: [facility]    Brief Narrative:  Brittany [name] is a 61 year old female with PMH significant for hypertension, diabetes mellitus, CVA with right-sided weakness, COPD, tobacco abuse, and status post pancreatectomy and splenectomy, anxiety/depression, discoid lupus erythematosus, hyperlipidemia who presented to [facility] ED on 2/22 via EMS with confusion.    Per her daughter at baseline, patient is oriented x 3. She has a history of stroke with right-sided weakness. Normally pt can use a cane to walk. This AM, pt was found to be on the floor with confusion. Pt has jerking and slurred speech per her daughter.    Per EDP, EMS reported that the house was extremely filthy. The patient appeared to be in a soiled diaper. Pt has an colostomy with no collection bag in place. Daughter did not know which medications the patient was on.  EMS states they witnessed what could be a partial seizure, and gave the patient a total of 4 mg of intranasal Versed in route to the hospital. When I saw pt in ED. Pt was confused, not orientated x3. She has right-sided weakness which is likely from previous stroke.   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 Pt does not seem to have chest pain.  Not sure if patient has symptoms of UTI.  Her daughter is not sure if patient has a history of seizure.  Daughter states that she found a Keppra bottle which is about 62 years old, not sure if patient is taking his medications or not.   Patient was noted to have DKA, subclinical seizure on EEG and incidental finding of COVID-19 viral infection.  Neurology was consulted and patient was transferred to [facility] for continuous EEG.  Admitted to the hospital service.  Now DKA resolved.  Patient is off isolation of COVID-19.  Psychiatry consulted for agitation recommended Keppra should be changed to  Depakote.  Patient remains in soft restraints.  Plan is to discharge to SNF once she is off restraints for 48 hours.    Assessment and Plan: * New onset seizure ([place]) Suspect due to lowered seizure threshold, precipitated by DKA/COVID. Neurology was consulted and followed during initial hospital course.   Discussed with psychiatry, given behavioral disturbances may be related to Keppra use, Keppra dose reduced in favor of Depakote. Continue Depakote 250 mg twice daily, continue Keppra 250 twice daily. Depakote level less than 10 Outpatient follow-up with neurology in 2 to 3 months.  Acute metabolic encephalopathy Improved since admission, but seems to be waxing and waning.   Likely complicated by hospital-acquired delirium and fluctuations in glucose due to noncompliance with diet.   She is working with physical and Occupational Therapy.  She will need to be discharged to SNF for rehab and then may need to live with her sister as she is likely unable to manage her ADLs at home alone.  DKA (diabetic ketoacidosis) ([place]) DKA resolved.  Hemoglobin A1c 15.0 consistent with noncompliance. Now transitioned to subcu insulin.  Diabetic coordinator following up.  Type 2 diabetes mellitus with hyperlipidemia (HCC) HbA1c 15.0> poor control. Suspect noncompliance. Continue Semglee 8 units daily. NovoLog 6 units TID AC Sliding scale for coverage   Essential hypertension Benazepril discontinued due to hyperkalemia. Resume once potassium improves.  Hypokalemia; hyperkalemia Initially repleted during this hospitalization.  Potassium increased.  Given Lokelma. Now potassium has improved.  Resume benazepril.  Restless leg syndrome Continue ropinirole.  Elevated troponin Etiology likely sec. to type II demand ischemia in the setting of DKA and seizure as above.   EKG with no concerning dynamic changes.  No concerning arrhythmias noted on telemetry, now discontinued.    History of  stroke Continue statin. Not on aspirin/Plavix outpatient  Hypomagnesemia Replaced.  Continue to monitor  COVID-19 virus infection Incidental finding with+ PCR on 04/05/2021.   Completed 10 days of airborne/contact isolation, now discontinued on 3/5  Status post colostomy [facility]) Wound care consulted for continued colostomy recommendations while inpatient.  COPD, mild ([place]) Stable.  Currently on room air Continue albuterol MDI and 1-2 puffs every 4 hours.   Protein-calorie malnutrition, severe Nutrition Status: Nutrition Problem: Severe Malnutrition Etiology: chronic illness (cancer, lupus) Signs/Symptoms: severe muscle depletion, severe fat depletion, percent weight loss Percent weight loss: 16.5 % (x5 months) Seen by nutrition.  Regular diet with thin liquids.  Continue to encourage increased supplementation.  Pressure injury of skin Continue local wound care, offloading.  Behavior related to cognitive impairment Continue Seroquel '25mg'$  qAm abd '50mg'$  PO qHS Continue clonazepam 0.'5mg'$  BID Continue melatonin '6mg'$  PO qHS Discussed with psychiatry on 3/8, Keppra could be causing neuropsych side effects and recommend discontinuation of Keppra and starting Depakote as above.  Keppra dose reduced.  Hyperkalemia-resolved as of 04/20/2021 Benazepril discontinued.  Now potassium improved. Discontinue Lokelma   DVT prophylaxis:   Lovenox.   Code Status: Full Code Family Communication: No family at bedside.  Disposition Plan:  Level of care: Med-Surg Status is: Inpatient Remains inpatient appropriate because: Pending SNF placement    Consultants:  Neurology - signed off 2/27  Procedures:  EEG  Antimicrobials:  None   Subjective: Patient was seen and examined at bedside.  Overnight events noted. Patient appears comfortable, much improved, off hand restraints. She was trying to get out of bed in the night, still has Posey band.   Objective: Vitals:   04/20/21 2340  04/21/21 0422 04/21/21 0500 04/21/21 0737  BP: (!) 130/92 (!) 143/93  113/76  Pulse: (!) 106 94  (!) 104  Resp: '18 18  18  '$ Temp: 98.9 F (37.2 C) 98.3 F (36.8 C)  98.4 F (36.9 C)  TempSrc: Oral Oral  Oral  SpO2: 99% 97%  99%  Weight:   43 kg   Height:        Intake/Output Summary (Last 24 hours) at 04/21/2021 1150 Last data filed at 04/21/2021 0900 Gross per 24 hour  Intake 1759.18 ml  Output --  Net 1759.18 ml    Filed Weights   04/13/21 0436 04/18/21 0500 04/21/21 0500  Weight: 43 kg 42.3 kg 43 kg    Examination:  Physical Exam: General exam: Appears comfortable, not in any acute distress.  Deconditioned Respiratory : Clear to auscultation bilaterally, no wheezes, no crackles, normal respiratory effort. Cardiovascular : S1-S2 heard, regular rate and rhythm, no murmur. Gastrointestinal : Abdomen is soft, nontender, nondistended, colostomy bag noted with brown stool in the bag Central nervous system: Alert, oriented x 2, following commands.  Moves all extremities Extremities: No edema, no cyanosis, no clubbing. Psychiatry: Mood, insight, judgment appropriate Skin: stage II wounds noted left buttock likely from excoriation    Data Reviewed: I have personally reviewed following labs and imaging studies  CBC: Recent Labs  Lab 04/16/21 2107 04/18/21 0004 04/20/21 0329  WBC 11.9* 8.3 14.9*  HGB 10.6* 9.7* 10.2*  HCT 34.4* 29.9* 30.9*  MCV 93.5 91.4 89.0  PLT 515*  500* 379*   Basic Metabolic Panel: Recent Labs  Lab 04/16/21 0352 04/16/21 0753 04/17/21 0807 04/17/21 1749 04/17/21 2115 04/18/21 0004 04/19/21 0750 04/20/21 0147  NA 126*  --  132*  --  128* 132* 135 138  K 6.1*   < > 5.7* 6.7* >7.5* 5.3* 4.7 4.6  CL 99  --  106  --  105 106 106 106  CO2 19*  --  20*  --  15* 18* 20* 23  GLUCOSE 400*  --  231*  --  440* 394* 62* 189*  BUN 33*  --  30*  --  29* 29* 19 16  CREATININE 0.85  --  0.79  --  0.72 0.68 0.46 0.54  CALCIUM 8.9  --  8.9  --  8.3*  8.4* 8.8* 8.9  MG 2.1  --   --   --   --  1.8  --  1.6*  PHOS  --   --   --   --   --  3.9  --   --    < > = values in this interval not displayed.   GFR: Estimated Creatinine Clearance: 50.1 mL/min (by C-G formula based on SCr of 0.54 mg/dL). Liver Function Tests: Recent Labs  Lab 04/18/21 0004 04/20/21 0147  AST 42* 50*  ALT 46* 51*  ALKPHOS 137* 119  BILITOT 0.5 0.3  PROT 7.0 6.7  ALBUMIN 2.2* 2.3*    No results for input(s): LIPASE, AMYLASE in the last 168 hours. No results for input(s): AMMONIA in the last 168 hours.  Coagulation Profile: No results for input(s): INR, PROTIME in the last 168 hours. Cardiac Enzymes: No results for input(s): CKTOTAL, CKMB, CKMBINDEX, TROPONINI in the last 168 hours. BNP (last 3 results) No results for input(s): PROBNP in the last 8760 hours. HbA1C: No results for input(s): HGBA1C in the last 72 hours. CBG: Recent Labs  Lab 04/20/21 1619 04/20/21 1959 04/20/21 2339 04/21/21 0415 04/21/21 0734  GLUCAP 288* 151* 141* 416* 302*   Lipid Profile: No results for input(s): CHOL, HDL, LDLCALC, TRIG, CHOLHDL, LDLDIRECT in the last 72 hours. Thyroid Function Tests: No results for input(s): TSH, T4TOTAL, FREET4, T3FREE, THYROIDAB in the last 72 hours. Anemia Panel: No results for input(s): VITAMINB12, FOLATE, FERRITIN, TIBC, IRON, RETICCTPCT in the last 72 hours. Sepsis Labs: Recent Labs  Lab 04/16/21 2107  LATICACIDVEN 1.3    Recent Results (from the past 240 hour(s))  Culture, blood (routine x 2)     Status: None (Preliminary result)   Collection Time: 04/16/21  9:26 PM   Specimen: BLOOD RIGHT ARM  Result Value Ref Range Status   Specimen Description BLOOD RIGHT ARM  Final   Special Requests   Final    BOTTLES DRAWN AEROBIC AND ANAEROBIC Blood Culture results may not be optimal due to an inadequate volume of blood received in culture bottles   Culture   Final    NO GROWTH 4 DAYS Performed at [facility], [place]  [address] [place], [place] [address]    Report Status PENDING  Incomplete  Culture, blood (routine x 2)     Status: None (Preliminary result)   Collection Time: 04/16/21  9:30 PM   Specimen: BLOOD LEFT ARM  Result Value Ref Range Status   Specimen Description BLOOD LEFT ARM  Final   Special Requests   Final    BOTTLES DRAWN AEROBIC AND ANAEROBIC Blood Culture results may not be optimal due to an inadequate  volume of blood received in culture bottles   Culture   Final    NO GROWTH 4 DAYS Performed at [facility], [place] [address], [place], [place] [address]    Report Status PENDING  Incomplete   Radiology Studies: No results found.  Scheduled Meds:  atorvastatin  40 mg Oral QHS   clonazePAM  0.5 mg Oral BID   divalproex  250 mg Oral Q12H   enoxaparin (LOVENOX) injection  20 mg Subcutaneous Q24H   feeding supplement (GLUCERNA SHAKE)  237 mL Oral TID BM   Gerhardt's butt cream   Topical QID   insulin aspart  0-9 Units Subcutaneous Q4H   levETIRAcetam  250 mg Oral BID   lipase/protease/amylase  72,000 Units Oral TID with meals   melatonin  6 mg Oral QHS   mirtazapine  7.5 mg Oral QHS   multivitamin with minerals  1 tablet Oral Daily   rOPINIRole  0.5 mg Oral QHS   sodium chloride flush  10-40 mL Intracatheter Q12H   thiamine  100 mg Oral Daily   Continuous Infusions:  sodium chloride 75 mL/hr at 04/21/21 0955      LOS: 16 days    Time spent: 35 mins   Brittany Faul, MD [facility] Available via Epic secure chat 7am-7pm After these hours, please refer to coverage provider listed on [email or web address] 04/21/2021, 11:50 AM

## 2021-04-21 NOTE — Progress Notes (Signed)
Inpatient Diabetes Program Recommendations ? ?AACE/ADA: New Consensus Statement on Inpatient Glycemic Control (2015) ? ?Target Ranges:  Prepandial:   less than 140 mg/dL ?     Peak postprandial:   less than 180 mg/dL (1-2 hours) ?     Critically ill patients:  140 - 180 mg/dL  ? ? Latest Reference Range & Units 04/19/21 06:09 04/19/21 12:00 04/19/21 16:12 04/19/21 16:32 04/19/21 17:08 04/19/21 18:30 04/19/21 18:57 04/19/21 19:21 04/19/21 19:48  ?Glucose-Capillary 70 - 99 mg/dL 77 ? ? ? ?25 units Semglee '@0918'$  240 (H) ? ?7 units Novolog ? 50 (L) 66 (L) 133 (H) 56 (L) 98 65 (L) 133 (H)  ? ? Latest Reference Range & Units 04/20/21 06:03 04/20/21 06:25 04/20/21 06:42 04/20/21 11:44 04/20/21 16:19 04/20/21 19:59  ?Glucose-Capillary 70 - 99 mg/dL 48 (L) 54 (L) 120 (H) 389 (H) ? ?9 units Novolog ? 288 (H) ? ?5 units Novolog ? 151 (H) ? ?2 units Novolog ?  ? ? Latest Reference Range & Units 04/20/21 23:39 04/21/21 04:15 04/21/21 07:34  ?Glucose-Capillary 70 - 99 mg/dL 141 (H) ? ?1 unit Novolog ? 416 (H) ? ?10 units Novolog ?'@0611'$  302 (H) ? ?7 units Novolog ?  ? ? ? ? ? ?Home DM Meds: 70/30 Insulin 30 units QAM with breakfast ? ? ?Current Orders: Novolog Sensitive Correction Scale/ SSI (0-9 units) Q4 hours ? ? ? ? ?MD- Note Hypoglycemia on 03/08 and 03/09.  Semglee was stopped--last dose given AM 03/08.  Novolog Meal Coverage was also stopped. ? ?CBGs now severely elevated today. ? ?Please consider: ? ?1. Start Semglee back at lower dose--Semglee 8 units Daily (0.2 units/kg based on weight of 43 kg) ? ?2. Start very low dose Novolog Meal Coverage (but only give it pt eats at least 50% of meal) ?Novolog 2 units TID with meals ? ? ? ? ?--Will follow patient during hospitalization-- ? ?Wyn Quaker RN, MSN, CDE ?Diabetes Coordinator ?Inpatient Glycemic Control Team ?Team Pager: [phone] (8a-5p) ? ? ?

## 2021-04-22 LAB — COMPREHENSIVE METABOLIC PANEL
ALT: 40 U/L (ref 0–44)
AST: 32 U/L (ref 15–41)
Albumin: 2.2 g/dL — ABNORMAL LOW (ref 3.5–5.0)
Alkaline Phosphatase: 121 U/L (ref 38–126)
Anion gap: 7 (ref 5–15)
BUN: 10 mg/dL (ref 8–23)
CO2: 25 mmol/L (ref 22–32)
Calcium: 8.7 mg/dL — ABNORMAL LOW (ref 8.9–10.3)
Chloride: 109 mmol/L (ref 98–111)
Creatinine, Ser: 0.5 mg/dL (ref 0.44–1.00)
GFR, Estimated: >60 mL/min (ref >60)
Glucose, Bld: 47 mg/dL — ABNORMAL LOW (ref 70–99)
Potassium: 3.8 mmol/L (ref 3.5–5.1)
Sodium: 141 mmol/L (ref 135–145)
Total Bilirubin: 0.2 mg/dL — ABNORMAL LOW (ref 0.3–1.2)
Total Protein: 7.3 g/dL (ref 6.5–8.1)

## 2021-04-22 LAB — CBC
HCT: 29.1 % — ABNORMAL LOW (ref 36.0–46.0)
Hemoglobin: 9.2 g/dL — ABNORMAL LOW (ref 12.0–15.0)
MCH: 28.7 pg (ref 26.0–34.0)
MCHC: 31.6 g/dL (ref 30.0–36.0)
MCV: 90.7 fL (ref 80.0–100.0)
Platelets: 605 10*3/uL — ABNORMAL HIGH (ref 150–400)
RBC: 3.21 MIL/uL — ABNORMAL LOW (ref 3.87–5.11)
RDW: 15.1 % (ref 11.5–15.5)
WBC: 8.6 10*3/uL (ref 4.0–10.5)
nRBC: 0 % (ref 0.0–0.2)

## 2021-04-22 LAB — GLUCOSE, CAPILLARY
Glucose-Capillary: 231 mg/dL — ABNORMAL HIGH (ref 70–99)
Glucose-Capillary: 239 mg/dL — ABNORMAL HIGH (ref 70–99)
Glucose-Capillary: 289 mg/dL — ABNORMAL HIGH (ref 70–99)
Glucose-Capillary: 325 mg/dL — ABNORMAL HIGH (ref 70–99)
Glucose-Capillary: 475 mg/dL — ABNORMAL HIGH (ref 70–99)
Glucose-Capillary: 81 mg/dL (ref 70–99)

## 2021-04-22 LAB — MAGNESIUM: Magnesium: 1.6 mg/dL — ABNORMAL LOW (ref 1.7–2.4)

## 2021-04-22 LAB — PHOSPHORUS: Phosphorus: 3.4 mg/dL (ref 2.5–4.6)

## 2021-04-22 MED ORDER — INSULIN GLARGINE-YFGN 100 UNIT/ML ~~LOC~~ SOLN
8.0000 [IU] | Freq: Once | SUBCUTANEOUS | Status: AC
  Administered 2021-04-22: 8 [IU] via SUBCUTANEOUS
  Filled 2021-04-22: qty 0.08

## 2021-04-22 MED ORDER — MAGNESIUM SULFATE 2 GM/50ML IV SOLN
2.0000 g | Freq: Once | INTRAVENOUS | Status: AC
  Administered 2021-04-22: 2 g via INTRAVENOUS
  Filled 2021-04-22: qty 50

## 2021-04-22 NOTE — Progress Notes (Signed)
HOSPITAL MEDICINE OVERNIGHT EVENT NOTE   ? ?Notified by nursing that patient has become increasingly agitated, attempting to strike at staff this morning.  This has been occurring despite administration of Haldol at 4:15 AM per standing order. ? ?Nursing attempting to redirect patient, focused on comfort, with efforts to round on patient frequently. ? ?At this time, will attempt to pursue conservative measures.  If this agitation and aggressiveness persists will reconsider additional doses of Haldol. ? ?Vernelle Emerald  MD ?[facility]  ? ? ? ? ? ? ? ? ? ? ?

## 2021-04-22 NOTE — Progress Notes (Addendum)
Patient has repeatedly chewed and worked out her restraints every other hour since the start of shift change. Then proceeding to get up, despite telesitter. Wrist restraints are not working for this patient. Have given patient her night medications.  ? ?Patient is being verbally abusive and swatting, punching as we (Agricultural consultant and myself) are trying to place restraints back on about 15 minutes ago. Telesitter is on and aware of patient repeatedly getting out restraints.  ? ?Has not touched ostomy. ? ?Patient angrily refusing restraints at this time. Patient is A&Ox2. Not always redirectable...needs a 1:1 sitter more than a telesitter.  ? ?Will page MD at earliest time for further advice and orders.  ?

## 2021-04-22 NOTE — Plan of Care (Signed)
?  Problem: Health Behavior/Discharge Planning: ?Goal: Ability to manage health-related needs will improve ?Outcome: Progressing ?  ?Problem: Nutrition: ?Goal: Adequate nutrition will be maintained ?Outcome: Progressing ?  ?Problem: Pain Managment: ?Goal: General experience of comfort will improve ?Outcome: Progressing ?  ?Problem: Safety: ?Goal: Ability to remain free from injury will improve ?Outcome: Progressing ?  ?

## 2021-04-22 NOTE — Progress Notes (Signed)
Dr. Mal Misty made aware of patient's CBG of 475. Ordered to give 9 units of novolog. I also made him aware and received order to put patient in bilateral wrist restraints and to remove the soft belt restraint. She is continuously trying to get out of bed and the belt was not helping. She has also removed her entire ostomy pouch 4 times today and then smeared poop all over herself and the bed. Hopefull the wrist restraints will work better for her.  ?

## 2021-04-22 NOTE — Progress Notes (Signed)
OT Cancellation Note ? ?Patient Details ?Name: KIMBERLYANN HOLLAR ?MRN: [id number] ?DOB: August 21, 1960 ? ? ?Cancelled Treatment:    Reason Eval/Treat Not Completed: Patient declined, no reason specified (Pt cursing at therapist and declinging to participate despite gentle efforts. OT to f/u as pt allows.) ? ?Jaymir Struble A Ibrahem Volkman ?04/22/2021, 4:48 PM ?

## 2021-04-22 NOTE — Progress Notes (Addendum)
Progress Note    Brittany [name]  GXQ:[id number] DOB: 18-Dec-1960  DOA: 04/05/2021 PCP: [facility]      Brief Narrative:    Medical records reviewed and are as summarized below:   Brittany [name] is a 61 year old female with PMH significant for hypertension, diabetes mellitus, CVA with right-sided weakness, COPD, tobacco abuse, and status post pancreatectomy and splenectomy, s/p colostomy, anxiety/depression, discoid lupus erythematosus, hyperlipidemia who presented to [facility] ED on 2/22 via EMS with confusion.    Per her daughter at baseline, patient is oriented x 3. She has a history of stroke with right-sided weakness. Normally pt can use a cane to walk. On the morning of admission, she was found on the floor and was noted to have confusion, jerking movements and slurred speech.     Per EDP, EMS reported that the house was extremely filthy. The patient appeared to be in a soiled diaper. Pt has an colostomy with no collection bag in place. Daughter did not know which medications the patient was on.  EMS states they witnessed what could be a partial seizure, and gave the patient a total of 4 mg of intranasal Versed in route to the hospital.  Reportedly, her daughter found Keppra bottle which is about 62 years old but she was not sure whether patient was still taking it or not.  Patient was noted to have DKA, subclinical seizure on EEG and incidental finding of COVID-19 viral infection.  Neurology was consulted and patient was transferred to [facility] for continuous EEG.  Admitted to the hospital service.  Now DKA resolved.  Patient is off isolation of COVID-19.  Psychiatry consulted for agitation, and  recommended that Keppra be changed to Depakote.  Patient remains in soft restraints.  Plan is to discharge to SNF once she is off restraints for 48 hours.      Assessment/Plan:   Principal Problem:   New onset seizure ([place]) Active Problems:   Acute metabolic  encephalopathy   DKA (diabetic ketoacidosis) ([place])   Type 2 diabetes mellitus with hyperlipidemia ([place])   Essential hypertension   Hypokalemia; hyperkalemia   Restless leg syndrome   Elevated troponin   History of stroke   Hypomagnesemia   COVID-19 virus infection   Status post colostomy (HCC)   COPD, mild (HCC)   Protein-calorie malnutrition, severe   Pressure injury of skin   Behavior related to cognitive impairment   Nutrition Problem: Severe Malnutrition Etiology: chronic illness (cancer, lupus)  Signs/Symptoms: severe muscle depletion, severe fat depletion, percent weight loss Percent weight loss: 16.5 % (x5 months)   Body mass index is 19.33 kg/m.  Diet Order             Diet Carb Modified Fluid consistency: Thin; Room service appropriate? Yes with Assist  Diet effective now                    Seizure: Continue Keppra and Depakote.  Keppra dose has been reduced at the recommendation of the psychiatrist because of agitation.  Acute metabolic encephalopathy and delirium: Continue psychotropics.  Use Haldol as needed for agitation.  Continue supportive care.  Patient is in soft wrist restraints to prevent injury to self or others.  Follow-up with psychiatrist.  Type II DM with hyperglycemia, DKA, intermittent hypoglycemia: DKA has resolved.  Hemoglobin A1c was 15.  Continue insulin glargine and NovoLog.  Monitor glucose levels closely.  Discontinue IV fluids and monitor oral intake  Hypomagnesemia: Replete magnesium with IV magnesium sulfate  Hypokalemia, hyperkalemia: Resolved.  She is off benazepril.  Incidental finding of COVID-19 infection: She is off isolation.  Elevated troponin was likely from demand ischemia.  Other comorbidities include history of stroke, restless leg syndrome, hypertension, COPD         Consultants: Psychiatrist  Procedures: None    Medications:    atorvastatin  40 mg Oral QHS   clonazePAM  0.25 mg Oral BID    divalproex  250 mg Oral Q12H   enoxaparin (LOVENOX) injection  20 mg Subcutaneous Q24H   feeding supplement (GLUCERNA SHAKE)  237 mL Oral TID BM   Gerhardt's butt cream   Topical QID   insulin aspart  0-9 Units Subcutaneous Q4H   levETIRAcetam  250 mg Oral BID   lipase/protease/amylase  72,000 Units Oral TID with meals   melatonin  6 mg Oral QHS   mirtazapine  7.5 mg Oral QHS   multivitamin with minerals  1 tablet Oral Daily   rOPINIRole  0.5 mg Oral QHS   sodium chloride flush  10-40 mL Intracatheter Q12H   thiamine  100 mg Oral Daily   Continuous Infusions:  sodium chloride 75 mL/hr at 04/21/21 2322     Anti-infectives (From admission, onward)    None              Family Communication/Anticipated D/C date and plan/Code Status   DVT prophylaxis:      Code Status: Full Code  Family Communication: None Disposition Plan: Plan to discharge to SNF   Status is: Inpatient Remains inpatient appropriate because: Intermittent agitation requiring restraints and sedatives       Subjective:   Interval events noted.  She is confused unable to provide any history.  Objective:    Vitals:   04/22/21 0307 04/22/21 0500 04/22/21 0834 04/22/21 1116  BP: 116/83  128/80 (!) 161/92  Pulse: 99  87 85  Resp: '19  18 18  '$ Temp: (!) 97.5 F (36.4 C)  98 F (36.7 C) 98 F (36.7 C)  TempSrc: Oral  Oral   SpO2: 96%  98% 100%  Weight:  44.9 kg    Height:       No data found.   Intake/Output Summary (Last 24 hours) at 04/22/2021 1234 Last data filed at 04/22/2021 0830 Gross per 24 hour  Intake 1144.88 ml  Output --  Net 1144.88 ml   Filed Weights   04/18/21 0500 04/21/21 0500 04/22/21 0500  Weight: 42.3 kg 43 kg 44.9 kg    Exam:  GEN: NAD, in soft wrist restraints SKIN: No rash EYES: EOMI ENT: MMM CV: RRR PULM: CTA B ABD: soft, ND, NT, +BS, +colostomy CNS: AAO x 1 (person), non focal EXT: No edema or tenderness        Data Reviewed:   I have  personally reviewed following labs and imaging studies:  Labs: Labs show the following:   Basic Metabolic Panel: Recent Labs  Lab 04/16/21 0352 04/16/21 0753 04/17/21 2115 04/18/21 0004 04/19/21 0750 04/20/21 0147 04/22/21 0142  NA 126*   < > 128* 132* 135 138 141  K 6.1*   < > >7.5* 5.3* 4.7 4.6 3.8  CL 99   < > 105 106 106 106 109  CO2 19*   < > 15* 18* 20* 23 25  GLUCOSE 400*   < > 440* 394* 62* 189* 47*  BUN 33*   < > 29* 29* '19 16 10  '$ CREATININE  0.85   < > 0.72 0.68 0.46 0.54 0.50  CALCIUM 8.9   < > 8.3* 8.4* 8.8* 8.9 8.7*  MG 2.1  --   --  1.8  --  1.6* 1.6*  PHOS  --   --   --  3.9  --   --  3.4   < > = values in this interval not displayed.   GFR Estimated Creatinine Clearance: 52.3 mL/min (by C-G formula based on SCr of 0.5 mg/dL). Liver Function Tests: Recent Labs  Lab 04/18/21 0004 04/20/21 0147 04/22/21 0142  AST 42* 50* 32  ALT 46* 51* 40  ALKPHOS 137* 119 121  BILITOT 0.5 0.3 0.2*  PROT 7.0 6.7 7.3  ALBUMIN 2.2* 2.3* 2.2*   No results for input(s): LIPASE, AMYLASE in the last 168 hours. No results for input(s): AMMONIA in the last 168 hours. Coagulation profile No results for input(s): INR, PROTIME in the last 168 hours.  CBC: Recent Labs  Lab 04/16/21 2107 04/18/21 0004 04/20/21 0329 04/22/21 0142  WBC 11.9* 8.3 14.9* 8.6  HGB 10.6* 9.7* 10.2* 9.2*  HCT 34.4* 29.9* 30.9* 29.1*  MCV 93.5 91.4 89.0 90.7  PLT 515* 500* 584* 605*   Cardiac Enzymes: No results for input(s): CKTOTAL, CKMB, CKMBINDEX, TROPONINI in the last 168 hours. BNP (last 3 results) No results for input(s): PROBNP in the last 8760 hours. CBG: Recent Labs  Lab 04/21/21 1958 04/21/21 2309 04/22/21 0305 04/22/21 0833 04/22/21 1118  GLUCAP 406* 200* 81 231* 289*   D-Dimer: No results for input(s): DDIMER in the last 72 hours. Hgb A1c: No results for input(s): HGBA1C in the last 72 hours. Lipid Profile: No results for input(s): CHOL, HDL, LDLCALC, TRIG, CHOLHDL,  LDLDIRECT in the last 72 hours. Thyroid function studies: No results for input(s): TSH, T4TOTAL, T3FREE, THYROIDAB in the last 72 hours.  Invalid input(s): FREET3 Anemia work up: No results for input(s): VITAMINB12, FOLATE, FERRITIN, TIBC, IRON, RETICCTPCT in the last 72 hours. Sepsis Labs: Recent Labs  Lab 04/16/21 2107 04/18/21 0004 04/20/21 0329 04/22/21 0142  WBC 11.9* 8.3 14.9* 8.6  LATICACIDVEN 1.3  --   --   --     Microbiology Recent Results (from the past 240 hour(s))  Culture, blood (routine x 2)     Status: None   Collection Time: 04/16/21  9:26 PM   Specimen: BLOOD RIGHT ARM  Result Value Ref Range Status   Specimen Description BLOOD RIGHT ARM  Final   Special Requests   Final    BOTTLES DRAWN AEROBIC AND ANAEROBIC Blood Culture results may not be optimal due to an inadequate volume of blood received in culture bottles   Culture   Final    NO GROWTH 5 DAYS Performed at [facility], [place] [address], [place], [place] [address]    Report Status 04/21/2021 FINAL  Final  Culture, blood (routine x 2)     Status: None   Collection Time: 04/16/21  9:30 PM   Specimen: BLOOD LEFT ARM  Result Value Ref Range Status   Specimen Description BLOOD LEFT ARM  Final   Special Requests   Final    BOTTLES DRAWN AEROBIC AND ANAEROBIC Blood Culture results may not be optimal due to an inadequate volume of blood received in culture bottles   Culture   Final    NO GROWTH 5 DAYS Performed at [facility], [place] [address] [place], [place] [address]    Report Status 04/21/2021 FINAL  Final    Procedures and diagnostic studies:  No results found.             LOS: 17 days   [place] Copywriter, advertising on [email or web address]. If 7PM-7AM, please contact night-coverage at [email or web address]     04/22/2021, 12:34 PM

## 2021-04-23 DIAGNOSIS — R4189 Other symptoms and signs involving cognitive functions and awareness: Secondary | ICD-10-CM

## 2021-04-23 LAB — GLUCOSE, CAPILLARY
Glucose-Capillary: 110 mg/dL — ABNORMAL HIGH (ref 70–99)
Glucose-Capillary: 191 mg/dL — ABNORMAL HIGH (ref 70–99)
Glucose-Capillary: 443 mg/dL — ABNORMAL HIGH (ref 70–99)
Glucose-Capillary: 279 mg/dL — ABNORMAL HIGH (ref 70–99)
Glucose-Capillary: 305 mg/dL — ABNORMAL HIGH (ref 70–99)

## 2021-04-23 LAB — CBC WITH DIFFERENTIAL/PLATELET
Abs Immature Granulocytes: 0.04 10*3/uL (ref 0.00–0.07)
Basophils Absolute: 0 10*3/uL (ref 0.0–0.1)
Basophils Relative: 0 %
Eosinophils Absolute: 0.1 10*3/uL (ref 0.0–0.5)
Eosinophils Relative: 1 %
HCT: 28.3 % — ABNORMAL LOW (ref 36.0–46.0)
Hemoglobin: 9.1 g/dL — ABNORMAL LOW (ref 12.0–15.0)
Immature Granulocytes: 1 %
Lymphocytes Relative: 52 %
Lymphs Abs: 3.6 10*3/uL (ref 0.7–4.0)
MCH: 29 pg (ref 26.0–34.0)
MCHC: 32.2 g/dL (ref 30.0–36.0)
MCV: 90.1 fL (ref 80.0–100.0)
Monocytes Absolute: 0.5 10*3/uL (ref 0.1–1.0)
Monocytes Relative: 8 %
Neutro Abs: 2.7 10*3/uL (ref 1.7–7.7)
Neutrophils Relative %: 38 %
Platelets: 621 10*3/uL — ABNORMAL HIGH (ref 150–400)
RBC: 3.14 MIL/uL — ABNORMAL LOW (ref 3.87–5.11)
RDW: 15 % (ref 11.5–15.5)
WBC: 7 10*3/uL (ref 4.0–10.5)
nRBC: 0 % (ref 0.0–0.2)

## 2021-04-23 LAB — BASIC METABOLIC PANEL
Anion gap: 7 (ref 5–15)
BUN: 8 mg/dL (ref 8–23)
CO2: 28 mmol/L (ref 22–32)
Calcium: 8.7 mg/dL — ABNORMAL LOW (ref 8.9–10.3)
Chloride: 102 mmol/L (ref 98–111)
Creatinine, Ser: 0.52 mg/dL (ref 0.44–1.00)
GFR, Estimated: >60 mL/min (ref >60)
Glucose, Bld: 211 mg/dL — ABNORMAL HIGH (ref 70–99)
Potassium: 4.4 mmol/L (ref 3.5–5.1)
Sodium: 137 mmol/L (ref 135–145)

## 2021-04-23 LAB — MAGNESIUM: Magnesium: 1.5 mg/dL — ABNORMAL LOW (ref 1.7–2.4)

## 2021-04-23 MED ORDER — INSULIN GLARGINE-YFGN 100 UNIT/ML ~~LOC~~ SOLN
10.0000 [IU] | Freq: Every day | SUBCUTANEOUS | Status: DC
  Administered 2021-04-23: 10 [IU] via SUBCUTANEOUS
  Filled 2021-04-23 (×2): qty 0.1

## 2021-04-23 MED ORDER — MAGNESIUM SULFATE 2 GM/50ML IV SOLN
2.0000 g | Freq: Once | INTRAVENOUS | Status: AC
  Administered 2021-04-23: 2 g via INTRAVENOUS
  Filled 2021-04-23: qty 50

## 2021-04-23 MED ORDER — MIRTAZAPINE 15 MG PO TABS
7.5000 mg | ORAL_TABLET | Freq: Every day | ORAL | Status: DC
  Administered 2021-04-23 – 2021-05-02 (×10): 7.5 mg via ORAL
  Filled 2021-04-23 (×10): qty 1

## 2021-04-23 NOTE — Progress Notes (Signed)
Progress Note    Brittany [name]  [id number] DOB: 1960-11-10  DOA: 04/05/2021 PCP: [facility]      Brief Narrative:    Medical records reviewed and are as summarized below:   Brittany [name] is a 61 year old female with PMH significant for hypertension, diabetes mellitus, CVA with right-sided weakness, COPD, tobacco abuse, and status post pancreatectomy and splenectomy, s/p colostomy, anxiety/depression, discoid lupus erythematosus, hyperlipidemia who presented to [facility] ED on 2/22 via EMS with confusion.    Per her daughter at baseline, patient is oriented x 3. She has a history of stroke with right-sided weakness. Normally pt can use a cane to walk. On the morning of admission, she was found on the floor and was noted to have confusion, jerking movements and slurred speech.     Per EDP, EMS reported that the house was extremely filthy. The patient appeared to be in a soiled diaper. Pt has an colostomy with no collection bag in place. Daughter did not know which medications the patient was on.  EMS states they witnessed what could be a partial seizure, and gave the patient a total of 4 mg of intranasal Versed in route to the hospital.  Reportedly, her daughter found Keppra bottle which is about 62 years old but she was not sure whether patient was still taking it or not.  Patient was noted to have DKA, subclinical seizure on EEG and incidental finding of COVID-19 viral infection.  Neurology was consulted and patient was transferred to [facility] for continuous EEG.  Admitted to the hospital service.  Now DKA resolved.  Patient is off isolation of COVID-19.  Psychiatry consulted for agitation, and  recommended that Keppra be changed to Depakote.  Patient remains in soft restraints.  Plan is to discharge to SNF once she is off restraints for 48 hours.      Assessment/Plan:   Principal Problem:   New onset seizure ([place]) Active Problems:   Acute metabolic  encephalopathy   DKA (diabetic ketoacidosis) ([place])   Type 2 diabetes mellitus with hyperlipidemia ([place])   Essential hypertension   Hypokalemia; hyperkalemia   Restless leg syndrome   Elevated troponin   History of stroke   Hypomagnesemia   COVID-19 virus infection   Status post colostomy (HCC)   COPD, mild (HCC)   Protein-calorie malnutrition, severe   Pressure injury of skin   Behavior related to cognitive impairment   Nutrition Problem: Severe Malnutrition Etiology: chronic illness (cancer, lupus)  Signs/Symptoms: severe muscle depletion, severe fat depletion, percent weight loss Percent weight loss: 16.5 % (x5 months)   Body mass index is 19.33 kg/m.  Diet Order             Diet Carb Modified Fluid consistency: Thin; Room service appropriate? Yes with Assist  Diet effective now                    Seizure: Continue Keppra and Depakote.  Keppra dose has been reduced at the recommendation of the psychiatrist because of agitation.  Acute metabolic encephalopathy and delirium: Continue psychotropics.  Use IM Haldol as needed for agitation.  Continue supportive care.  She is seen restraints to prevent injury to self and others.  Follow-up with psychiatrist.  Type II DM with hyperglycemia, DKA, intermittent hypoglycemia: DKA has resolved.  Hemoglobin A1c was 15.  Continue insulin glargine and NovoLog.  Monitor glucose levels closely.   Hypomagnesemia: Continue magnesium repletion with IV magnesium sulfate.  Hypokalemia, hyperkalemia: Resolved.  She is off benazepril.  Incidental finding of COVID-19 infection: She is off isolation.  Elevated troponin was likely from demand ischemia.  Other comorbidities include history of stroke, restless leg syndrome, hypertension, COPD         Consultants: Psychiatrist  Procedures: None    Medications:    atorvastatin  40 mg Oral QHS   clonazePAM  0.25 mg Oral BID   divalproex  250 mg Oral Q12H   enoxaparin  (LOVENOX) injection  20 mg Subcutaneous Q24H   feeding supplement (GLUCERNA SHAKE)  237 mL Oral TID BM   Gerhardt's butt cream   Topical QID   insulin aspart  0-9 Units Subcutaneous Q4H   insulin glargine-yfgn  10 Units Subcutaneous Daily   levETIRAcetam  250 mg Oral BID   lipase/protease/amylase  72,000 Units Oral TID with meals   melatonin  6 mg Oral QHS   mirtazapine  7.5 mg Oral QHS   multivitamin with minerals  1 tablet Oral Daily   rOPINIRole  0.5 mg Oral QHS   sodium chloride flush  10-40 mL Intracatheter Q12H   thiamine  100 mg Oral Daily   Continuous Infusions:     Anti-infectives (From admission, onward)    None              Family Communication/Anticipated D/C date and plan/Code Status   DVT prophylaxis:      Code Status: Full Code  Family Communication: None Disposition Plan: Plan to discharge to SNF   Status is: Inpatient Remains inpatient appropriate because: Intermittent agitation requiring restraints and sedatives       Subjective:   Interval events noted.  Patient has been confused and agitated.  Her nurse was at the bedside.  Objective:    Vitals:   04/22/21 1615 04/22/21 2045 04/23/21 0343 04/23/21 0803  BP: (!) 155/92 (!) 118/96 (!) 139/94 (!) 144/96  Pulse: 88 95 89 88  Resp: '18 16 18 18  '$ Temp: 98 F (36.7 C) 98.8 F (37.1 C) 98.1 F (36.7 C) 98.9 F (37.2 C)  TempSrc: Oral Oral Oral Oral  SpO2: 100% 100% 100% 98%  Weight:      Height:       No data found.   Intake/Output Summary (Last 24 hours) at 04/23/2021 1159 Last data filed at 04/23/2021 1134 Gross per 24 hour  Intake 3250 ml  Output 1600 ml  Net 1650 ml   Filed Weights   04/18/21 0500 04/21/21 0500 04/22/21 0500  Weight: 42.3 kg 43 kg 44.9 kg    Exam:  GEN: NAD SKIN: No rash EYES: EOMI ENT: MMM CV: RRR PULM: CTA B ABD: soft, ND, NT, +BS, + colostomy with yellowish stools in colostomy bag CNS: AAO x 1 (person), nonfocal EXT: No edema or  tenderness       Data Reviewed:   I have personally reviewed following labs and imaging studies:  Labs: Labs show the following:   Basic Metabolic Panel: Recent Labs  Lab 04/18/21 0004 04/19/21 0750 04/20/21 0147 04/22/21 0142 04/23/21 0030  NA 132* 135 138 141 137  K 5.3* 4.7 4.6 3.8 4.4  CL 106 106 106 109 102  CO2 18* 20* '23 25 28  '$ GLUCOSE 394* 62* 189* 47* 211*  BUN 29* '19 16 10 8  '$ CREATININE 0.68 0.46 0.54 0.50 0.52  CALCIUM 8.4* 8.8* 8.9 8.7* 8.7*  MG 1.8  --  1.6* 1.6* 1.5*  PHOS 3.9  --   --  3.4  --    GFR Estimated Creatinine Clearance: 52.3 mL/min (by C-G formula based on SCr of 0.52 mg/dL). Liver Function Tests: Recent Labs  Lab 04/18/21 0004 04/20/21 0147 04/22/21 0142  AST 42* 50* 32  ALT 46* 51* 40  ALKPHOS 137* 119 121  BILITOT 0.5 0.3 0.2*  PROT 7.0 6.7 7.3  ALBUMIN 2.2* 2.3* 2.2*   No results for input(s): LIPASE, AMYLASE in the last 168 hours. No results for input(s): AMMONIA in the last 168 hours. Coagulation profile No results for input(s): INR, PROTIME in the last 168 hours.  CBC: Recent Labs  Lab 04/16/21 2107 04/18/21 0004 04/20/21 0329 04/22/21 0142 04/23/21 0030  WBC 11.9* 8.3 14.9* 8.6 7.0  NEUTROABS  --   --   --   --  2.7  HGB 10.6* 9.7* 10.2* 9.2* 9.1*  HCT 34.4* 29.9* 30.9* 29.1* 28.3*  MCV 93.5 91.4 89.0 90.7 90.1  PLT 515* 500* 584* 605* 621*   Cardiac Enzymes: No results for input(s): CKTOTAL, CKMB, CKMBINDEX, TROPONINI in the last 168 hours. BNP (last 3 results) No results for input(s): PROBNP in the last 8760 hours. CBG: Recent Labs  Lab 04/22/21 1641 04/22/21 2002 04/22/21 2333 04/23/21 0408 04/23/21 0825  GLUCAP 475* 325* 239* 110* 191*   D-Dimer: No results for input(s): DDIMER in the last 72 hours. Hgb A1c: No results for input(s): HGBA1C in the last 72 hours. Lipid Profile: No results for input(s): CHOL, HDL, LDLCALC, TRIG, CHOLHDL, LDLDIRECT in the last 72 hours. Thyroid function  studies: No results for input(s): TSH, T4TOTAL, T3FREE, THYROIDAB in the last 72 hours.  Invalid input(s): FREET3 Anemia work up: No results for input(s): VITAMINB12, FOLATE, FERRITIN, TIBC, IRON, RETICCTPCT in the last 72 hours. Sepsis Labs: Recent Labs  Lab 04/16/21 2107 04/18/21 0004 04/20/21 0329 04/22/21 0142 04/23/21 0030  WBC 11.9* 8.3 14.9* 8.6 7.0  LATICACIDVEN 1.3  --   --   --   --     Microbiology Recent Results (from the past 240 hour(s))  Culture, blood (routine x 2)     Status: None   Collection Time: 04/16/21  9:26 PM   Specimen: BLOOD RIGHT ARM  Result Value Ref Range Status   Specimen Description BLOOD RIGHT ARM  Final   Special Requests   Final    BOTTLES DRAWN AEROBIC AND ANAEROBIC Blood Culture results may not be optimal due to an inadequate volume of blood received in culture bottles   Culture   Final    NO GROWTH 5 DAYS Performed at [facility], [place] [address] [place], [place] [address]    Report Status 04/21/2021 FINAL  Final  Culture, blood (routine x 2)     Status: None   Collection Time: 04/16/21  9:30 PM   Specimen: BLOOD LEFT ARM  Result Value Ref Range Status   Specimen Description BLOOD LEFT ARM  Final   Special Requests   Final    BOTTLES DRAWN AEROBIC AND ANAEROBIC Blood Culture results may not be optimal due to an inadequate volume of blood received in culture bottles   Culture   Final    NO GROWTH 5 DAYS Performed at [facility], [place] [address], [place], [place] [address]    Report Status 04/21/2021 FINAL  Final    Procedures and diagnostic studies:  No results found.             LOS: 18 days   [facility]  Pager on [email or web address]. If 7PM-7AM, please contact night-coverage at [email or web address]     04/23/2021, 11:59 AM

## 2021-04-23 NOTE — Progress Notes (Signed)
Patient placed back in restraints at 0100 but has removed them again at this time. Patient is paging nurse desk for frequent items and itching to her buttocks. Keeps wanting coffee, we are giving her decaf at this time and discussed only 2 or  3 cups.  ? ?Patient has been given Haldol PRN for agitation and Atarax for itching.  ? ?Patient now in bed, resting.  ?

## 2021-04-23 NOTE — Progress Notes (Signed)
Dc'ed restraints at this time. Patient following directions and calm. ? ?

## 2021-04-23 NOTE — Progress Notes (Signed)
Called telesitter, explained how difficult it is to redirect the patient and how a 1:1 sitter would be. Called staffing and there are No 1:1 sitters/safety sitters available at this time in staffing.  ? ?Patient will remain tele-sitter for now.  ?

## 2021-04-23 NOTE — Progress Notes (Signed)
Patient has gotten out of restraints again and gotten out of bed against tele-sitter redirection. Patient has been deemed inappropriate by staffing and cancelled tele-sitting since she has caused them to use the STAT/urgent help button 3x this shift. Patient absolutely needs a 1:1 sitter.  ? ?Patient was found by staff out of bed and when attempting to get back in bed, she began fighting staff in a manner of punches and kicks.  ? ?Paged MD for possible ankle restraints as well.  ?

## 2021-04-23 NOTE — Progress Notes (Signed)
HOSPITAL MEDICINE OVERNIGHT EVENT NOTE   ? ?Nursing reports that patient has continued to be confused and quite aggressive throughout the evening.  Despite having a TeleSitter, patient has gotten out of restraints and gotten out of bed on more than one occasion. ? ?Additionally, even when patient is in two-point soft wrist restraints patient has been actively kicking staff, potentially injuring them and continuing to impede plan of care. ? ?Unfortunately due to this continued behavior that places the patient and staff at risk one-on-one sitter observation with four-point soft restraints is warranted at this time.  We will discontinue when able. ? ?Vernelle Emerald  MD ?[facility]  ? ? ? ? ? ? ? ? ? ? ?

## 2021-04-23 NOTE — Progress Notes (Signed)
MD notified of blood sugar and watery stool. Orders received. Do not need to send blood to lab to verify CBG ? ?

## 2021-04-24 ENCOUNTER — Other Ambulatory Visit: Payer: Self-pay

## 2021-04-24 LAB — BASIC METABOLIC PANEL
Anion gap: 8 (ref 5–15)
BUN: 17 mg/dL (ref 8–23)
CO2: 30 mmol/L (ref 22–32)
Calcium: 9.4 mg/dL (ref 8.9–10.3)
Chloride: 97 mmol/L — ABNORMAL LOW (ref 98–111)
Creatinine, Ser: 0.8 mg/dL (ref 0.44–1.00)
GFR, Estimated: >60 mL/min (ref >60)
Glucose, Bld: 374 mg/dL — ABNORMAL HIGH (ref 70–99)
Potassium: 5.3 mmol/L — ABNORMAL HIGH (ref 3.5–5.1)
Sodium: 135 mmol/L (ref 135–145)

## 2021-04-24 LAB — GLUCOSE, CAPILLARY
Glucose-Capillary: 337 mg/dL — ABNORMAL HIGH (ref 70–99)
Glucose-Capillary: 422 mg/dL — ABNORMAL HIGH (ref 70–99)
Glucose-Capillary: 153 mg/dL — ABNORMAL HIGH (ref 70–99)
Glucose-Capillary: 254 mg/dL — ABNORMAL HIGH (ref 70–99)
Glucose-Capillary: 198 mg/dL — ABNORMAL HIGH (ref 70–99)
Glucose-Capillary: 278 mg/dL — ABNORMAL HIGH (ref 70–99)
Glucose-Capillary: 157 mg/dL — ABNORMAL HIGH (ref 70–99)

## 2021-04-24 LAB — VALPROIC ACID LEVEL: Valproic Acid Lvl: 23 ug/mL — ABNORMAL LOW (ref 50.0–100.0)

## 2021-04-24 LAB — MAGNESIUM: Magnesium: 1.8 mg/dL (ref 1.7–2.4)

## 2021-04-24 MED ORDER — INSULIN ASPART 100 UNIT/ML IJ SOLN
11.0000 [IU] | Freq: Once | INTRAMUSCULAR | Status: AC
  Administered 2021-04-24: 11 [IU] via SUBCUTANEOUS

## 2021-04-24 MED ORDER — INSULIN ASPART 100 UNIT/ML IJ SOLN
2.0000 [IU] | Freq: Three times a day (TID) | INTRAMUSCULAR | Status: DC
  Administered 2021-04-24 – 2021-05-01 (×16): 2 [IU] via SUBCUTANEOUS

## 2021-04-24 MED ORDER — INSULIN GLARGINE-YFGN 100 UNIT/ML ~~LOC~~ SOLN
15.0000 [IU] | Freq: Every day | SUBCUTANEOUS | Status: DC
  Administered 2021-04-24: 15 [IU] via SUBCUTANEOUS
  Filled 2021-04-24 (×2): qty 0.15

## 2021-04-24 MED ORDER — DIVALPROEX SODIUM 250 MG PO DR TAB
500.0000 mg | DELAYED_RELEASE_TABLET | Freq: Two times a day (BID) | ORAL | Status: AC
  Administered 2021-04-24 – 2021-05-02 (×17): 500 mg via ORAL
  Filled 2021-04-24 (×17): qty 2

## 2021-04-24 NOTE — Progress Notes (Signed)
MD notified of blood sugar. Awaiting orders at this time ?

## 2021-04-24 NOTE — Progress Notes (Signed)
PROGRESS NOTE    Brittany [name]  BSJ:[id number] DOB: 1960-05-03 DOA: 04/05/2021 PCP: Clinic, [facility]    Brief Narrative:  Brittany [name] is a 61 year old female with PMH significant for hypertension, diabetes mellitus, CVA with right-sided weakness, COPD, tobacco abuse, and status post pancreatectomy and splenectomy, anxiety/depression, discoid lupus erythematosus, hyperlipidemia who presented to [facility] ED on 2/22 via EMS with confusion.    Per her daughter at baseline, patient is oriented x 3. She has a history of stroke with right-sided weakness. Normally pt can use a cane to walk. This AM, pt was found to be on the floor with confusion. Pt has jerking and slurred speech per her daughter.    Per EDP, EMS reported that the house was extremely filthy. The patient appeared to be in a soiled diaper. Pt has an colostomy with no collection bag in place. Daughter did not know which medications the patient was on.  EMS states they witnessed what could be a partial seizure, and gave the patient a total of 4 mg of intranasal Versed in route to the hospital. When I saw pt in ED. Pt was confused, not orientated x3. She has right-sided weakness which is likely from previous stroke.   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 Pt does not seem to have chest pain.  Not sure if patient has symptoms of UTI.  Her daughter is not sure if patient has a history of seizure.  Daughter states that she found a Keppra bottle which is about 62 years old, not sure if patient is taking his medications or not.   Patient was noted to have DKA, subclinical seizure on EEG and incidental finding of COVID-19 viral infection.  Neurology was consulted and patient was transferred to [facility] for continuous EEG.  Admitted to the hospital service.  Now DKA resolved.  Patient is off isolation of COVID-19.  Psychiatry consulted for agitation recommended Keppra should be changed to  Depakote.  Plan is to discharge to SNF once she is off restraints for 48 hours.      Assessment and Plan: * New onset seizure ([place]) Suspect due to lowered seizure threshold, precipitated by DKA/COVID. Neurology was consulted and followed during initial hospital course.  Discussed with psychiatry, given behavioral disturbances may be related to Keppra use, Keppra dose reduced in favor of Depakote. --Depakote level remains low, 23 today --Increase Depakote to 500 mg BID --Continue Keppra 250 mg BID. --Outpatient follow-up with neurology in 2 to 3 months.  Acute metabolic encephalopathy Improved since admission, but seems to be waxing and waning.   Likely complicated by hospital-acquired delirium and fluctuations in glucose due to noncompliance with diet.  --Continue PT/OT efforts while inpatient --Plan discharge to SNF for rehab and then may need to live with her sister as she is likely unable to manage her ADLs at home alone.  DKA (diabetic ketoacidosis) ([place]) DKA resolved.  Hemoglobin A1c 15.0 consistent with noncompliance. Now transitioned to subcu insulin.   --Diabetic educator following, appreciate assistance --Insulin as below  Type 2 diabetes mellitus with hyperlipidemia (HCC) HbA1c 15.0> poor control. Suspect noncompliance. --Semglee 15 units daily. --NovoLog 2 units TID AC --Sliding scale for coverage --CBGs qAC/HS   Essential hypertension Benazepril discontinued due to hyperkalemia. --BP stable off of antihypertensives  Hypokalemia; hyperkalemia Initially repleted during this hospitalization.  Potassium increased and was given Lokelma.  Benazepril discontinued.  Restless leg syndrome Continue ropinirole.  Elevated troponin Etiology likely secondary  to type II demand ischemia in the setting of DKA and seizure as above.  EKG with no concerning dynamic changes.  No concerning arrhythmias noted on telemetry, now discontinued.    History of stroke Continue  statin. Not on aspirin/Plavix outpatient  Hypomagnesemia Replaced.  Continue to monitor  COVID-19 virus infection Incidental finding with+ PCR on 04/05/2021.  Completed 10 days of airborne/contact isolation, now discontinued on 3/5  Status post colostomy [facility]) Wound care consulted for continued colostomy recommendations while inpatient.  COPD, mild ([place]) Stable.  Currently on room air --Continue albuterol MDI 1-2 puffs q4h PRN SOB/wheezing.   Protein-calorie malnutrition, severe Nutrition Status: Nutrition Problem: Severe Malnutrition Etiology: chronic illness (cancer, lupus) Signs/Symptoms: severe muscle depletion, severe fat depletion, percent weight loss Percent weight loss: 16.5 % (x5 months) Seen by nutrition.  Regular diet with thin liquids.  Continue to encourage increased supplementation.  Pressure injury of skin Continue local wound care, offloading.  Behavior related to cognitive impairment Continue Depakote 500 mg BID Continue clonazepam 0.'25mg'$  BID Continue melatonin '6mg'$  PO qHS Discussed with psychiatry on 3/8, Keppra could be causing neuropsych side effects and recommend discontinuation of Keppra and starting Depakote as above.  Keppra dose reduced.  Hyperkalemia-resolved as of 04/20/2021 Benazepril discontinued.  Now potassium improved. Discontinued Lokelma --BMP in am    DVT prophylaxis:   Lovenox   Code Status: Full Code Family Communication: No family present at bedside this morning  Disposition Plan:  Level of care: Med-Surg Status is: Inpatient Remains inpatient appropriate because: Pending SNF placement     Consultants:  Neurology - signed off 2/27  Procedures:  EEG  Antimicrobials:  None   Subjective: Patient seen examined bedside, lying in bed.  No family present.  Placed in restraints overnight once again for trying to get out of bed.  Restraints currently off, calm.  Just finished breakfast.  No other specific questions or concerns at  this time.  Depakote level remains low, will uptitrate dose. Patient otherwise denies chest pain, no shortness of breath, no abdominal pain.  No other acute events overnight per nursing staff.  Objective: Vitals:   04/23/21 2024 04/23/21 2311 04/24/21 0409 04/24/21 0825  BP: (!) 148/108 (!) 117/96 (!) 134/93 122/89  Pulse:   99 98  Resp: '16 17 16 16  '$ Temp: 98.4 F (36.9 C) 98.2 F (36.8 C) 98.1 F (36.7 C) 98.3 F (36.8 C)  TempSrc: Oral Oral Oral Oral  SpO2: 100% 100% 99% 100%  Weight:   45.9 kg   Height:        Intake/Output Summary (Last 24 hours) at 04/24/2021 1215 Last data filed at 04/24/2021 1157 Gross per 24 hour  Intake 2517.16 ml  Output 1700 ml  Net 817.16 ml    Filed Weights   04/21/21 0500 04/22/21 0500 04/24/21 0409  Weight: 43 kg 44.9 kg 45.9 kg    Examination:  Physical Exam: GEN: NAD, alert, pleasantly confused, not oriented to person/place/time or situation. thin/cachectic, chronically ill in appearance, appears older than stated age 70: NCAT, PERRL, EOMI, sclera clear, MMM PULM: CTAB w/o wheezes/crackles, normal respiratory effort, on room air CV: RRR w/o M/G/R GI: abd soft, NTND, NABS, no R/G/M, colostomy bag noted with soft brown stool in collection bag MSK: no peripheral edema, muscle strength globally intact 5/5 bilateral upper/lower extremities NEURO: CN II-XII intact, no focal deficits, sensation to light touch intact PSYCH: normal mood/affect Integumentary: Stage II wounds noted left buttock likely from excoriation    Data Reviewed: I  have personally reviewed following labs and imaging studies  CBC: Recent Labs  Lab 04/18/21 0004 04/20/21 0329 04/22/21 0142 04/23/21 0030  WBC 8.3 14.9* 8.6 7.0  NEUTROABS  --   --   --  2.7  HGB 9.7* 10.2* 9.2* 9.1*  HCT 29.9* 30.9* 29.1* 28.3*  MCV 91.4 89.0 90.7 90.1  PLT 500* 584* 605* 735*   Basic Metabolic Panel: Recent Labs  Lab 04/18/21 0004 04/19/21 0750 04/20/21 0147 04/22/21 0142  04/23/21 0030 04/24/21 0301  NA 132* 135 138 141 137 135  K 5.3* 4.7 4.6 3.8 4.4 5.3*  CL 106 106 106 109 102 97*  CO2 18* 20* '23 25 28 30  '$ GLUCOSE 394* 62* 189* 47* 211* 374*  BUN 29* '19 16 10 8 17  '$ CREATININE 0.68 0.46 0.54 0.50 0.52 0.80  CALCIUM 8.4* 8.8* 8.9 8.7* 8.7* 9.4  MG 1.8  --  1.6* 1.6* 1.5* 1.8  PHOS 3.9  --   --  3.4  --   --    GFR: Estimated Creatinine Clearance: 53 mL/min (by C-G formula based on SCr of 0.8 mg/dL). Liver Function Tests: Recent Labs  Lab 04/18/21 0004 04/20/21 0147 04/22/21 0142  AST 42* 50* 32  ALT 46* 51* 40  ALKPHOS 137* 119 121  BILITOT 0.5 0.3 0.2*  PROT 7.0 6.7 7.3  ALBUMIN 2.2* 2.3* 2.2*    No results for input(s): LIPASE, AMYLASE in the last 168 hours. No results for input(s): AMMONIA in the last 168 hours.  Coagulation Profile: No results for input(s): INR, PROTIME in the last 168 hours. Cardiac Enzymes: No results for input(s): CKTOTAL, CKMB, CKMBINDEX, TROPONINI in the last 168 hours. BNP (last 3 results) No results for input(s): PROBNP in the last 8760 hours. HbA1C: No results for input(s): HGBA1C in the last 72 hours. CBG: Recent Labs  Lab 04/23/21 1923 04/23/21 2310 04/24/21 0339 04/24/21 0818 04/24/21 1201  GLUCAP 305* 337* 422* 153* 254*   Lipid Profile: No results for input(s): CHOL, HDL, LDLCALC, TRIG, CHOLHDL, LDLDIRECT in the last 72 hours. Thyroid Function Tests: No results for input(s): TSH, T4TOTAL, FREET4, T3FREE, THYROIDAB in the last 72 hours. Anemia Panel: No results for input(s): VITAMINB12, FOLATE, FERRITIN, TIBC, IRON, RETICCTPCT in the last 72 hours. Sepsis Labs: No results for input(s): PROCALCITON, LATICACIDVEN in the last 168 hours.   Recent Results (from the past 240 hour(s))  Culture, blood (routine x 2)     Status: None   Collection Time: 04/16/21  9:26 PM   Specimen: BLOOD RIGHT ARM  Result Value Ref Range Status   Specimen Description BLOOD RIGHT ARM  Final   Special Requests    Final    BOTTLES DRAWN AEROBIC AND ANAEROBIC Blood Culture results may not be optimal due to an inadequate volume of blood received in culture bottles   Culture   Final    NO GROWTH 5 DAYS Performed at [facility], [place] [address], [place], [place] [address]    Report Status 04/21/2021 FINAL  Final  Culture, blood (routine x 2)     Status: None   Collection Time: 04/16/21  9:30 PM   Specimen: BLOOD LEFT ARM  Result Value Ref Range Status   Specimen Description BLOOD LEFT ARM  Final   Special Requests   Final    BOTTLES DRAWN AEROBIC AND ANAEROBIC Blood Culture results may not be optimal due to an inadequate volume of blood received in culture bottles   Culture  Final    NO GROWTH 5 DAYS Performed at [facility], [place] [address], [place], [place] [address]    Report Status 04/21/2021 FINAL  Final          Radiology Studies: No results found.      Scheduled Meds:  atorvastatin  40 mg Oral QHS   clonazePAM  0.25 mg Oral BID   divalproex  500 mg Oral Q12H   enoxaparin (LOVENOX) injection  20 mg Subcutaneous Q24H   feeding supplement (GLUCERNA SHAKE)  237 mL Oral TID BM   Gerhardt's butt cream   Topical QID   insulin aspart  0-9 Units Subcutaneous Q4H   insulin aspart  2 Units Subcutaneous TID WC   insulin glargine-yfgn  15 Units Subcutaneous Daily   levETIRAcetam  250 mg Oral BID   lipase/protease/amylase  72,000 Units Oral TID with meals   melatonin  6 mg Oral QHS   mirtazapine  7.5 mg Oral QHS   multivitamin with minerals  1 tablet Oral Daily   rOPINIRole  0.5 mg Oral QHS   sodium chloride flush  10-40 mL Intracatheter Q12H   thiamine  100 mg Oral Daily   Continuous Infusions:     LOS: 19 days    Time spent: 48 minutes spent on chart review, discussion with nursing staff, consultants, updating family and interview/physical exam; more than 50% of that time was spent in counseling and/or coordination of care.    Anand Tejada J [place] DO [facility] Available via Epic secure chat 7am-7pm After these hours, please refer to coverage provider listed on [email or web address] 04/24/2021, 12:15 PM

## 2021-04-24 NOTE — Consult Note (Addendum)
Brief Psychiatry Consult Note  ?The patient was last seen by the psychiatry service on 3/9. Interim documentation by primary team and nursing staff has been reviewed. At this time, patient has been significantly agitated at night (generally calm and pleasant during the day). Discussed case with Dr. [place] with following recommendations:  ? ?- obtain EKG ?- if qtc <500 add olanzapine 2.5 mg ? ?Will f/u on standing albumin order and re-address depakote dosing in full consult note tomorrow.  ? ?Brittany [name] ? ?NC1/no charge/did not see pt ?

## 2021-04-24 NOTE — Progress Notes (Signed)
Nutrition Follow-up ? ?DOCUMENTATION CODES:  ?Severe malnutrition in context of chronic illness, Underweight ? ?INTERVENTION:  ?-continue Glucerna Shake po TID, each supplement provides 220 kcal and 10 grams of protein, 16 grams carbohydrate ?-continue MVI with minerals daily ? ?NUTRITION DIAGNOSIS:  ?Severe Malnutrition related to chronic illness (cancer, lupus) as evidenced by severe muscle depletion, severe fat depletion, percent weight loss. -- ongoing ? ?GOAL:  ?Patient will meet greater than or equal to 90% of their needs -- progressing ? ?MONITOR:  ?Diet advancement, Labs, Weight trends, Skin, I & O's ? ?REASON FOR ASSESSMENT:  ?Malnutrition Screening Tool ?  ? ?ASSESSMENT:  ?Pt with PMH significant for DM, HTN, lupus, Ca, CVA w/ R-sided weakness, COPD, and s/p pancreatectomy and splenectomy admitted with acute metabolic encephalopathy and DKA. EEG noted subclinical seizure. ? ?2/24 - Cortrak placed (gastric tip confirmed via xray) ?2/25 - diet advanced to full liquids ?2/26 - diet advanced to dysphagia 2 with thin liquids ?2/27 - cortrak removed ?3/01 - diet advanced to regular texture ? ?Per MD, plan is to discharge to SNF once pt is off restraints for 48 hours. ? ?Pt's appetite has been good since last RD visit with last 8 meal completions charted as 100% intake. Pt without additional complaints at this time. Per RN, pt continues to do well with Glucerna supplements (ordered TID). Recommend continue current nutrition plan of care  ? ?Medications: SSI Q4H, 2 units novolog TID w/ meals, 15 units semglee daily, creon TID w/ meals, remeron, thiamine, keppra, mvi with minerals ?Labs: K+ 5.3 (H) ?CBGs: 153-422 x24 hours - diabetes coordinator following ?A1c (last taken 04/06/21) 15.0 ? ?UOP: 43m x24 hours ?Colostomy output: [id number] hours ?I/O: + 11,17256mince admit ? ?Admit wt 43.6 kg ?Current wt 45.9 kg ? ?Diet Order:   ?Diet Order   ? ?       ?  Diet Carb Modified Fluid consistency: Thin; Room service  appropriate? Yes with Assist  Diet effective now       ?  ? ?  ?  ? ?  ? ?EDUCATION NEEDS:  ?Not appropriate for education at this time ? ?Skin:  Skin Assessment: Skin Integrity Issues: ?Skin Integrity Issues:: Other (Comment) ?Incisions: abdomen (from previous admit) ?Other: MASD buttocks ? ?Last BM:  3/13 via colostomy ? ?Height:  ?Ht Readings from Last 1 Encounters:  ?04/05/21 5' (1.524 m)  ? ?Weight:  ?Wt Readings from Last 1 Encounters:  ?04/24/21 45.9 kg  ? ?BMI:  Body mass index is 19.76 kg/m?. ? ?Estimated Nutritional Needs:  ?Kcal:  1400-1600 ?Protein:  70-80 grams ?Fluid:  >1.4L ? ? ?AmaTheone StanleyMS, RD, LDN (she/her/hers) ?RD pager number and weekend/on-call pager number located in [facility] ?

## 2021-04-24 NOTE — Progress Notes (Signed)
Physical Therapy Treatment ?Patient Details ?Name: Brittany [name] ?MRN: [id number] ?DOB: [birth year] ?Today's Date: 04/24/2021 ? ? ?History of Present Illness Brittany [name] is a 75yoF who was brought to the ED for confusion plus DKA.  EEG noted subclinical seizure.  Also found to have incidental COVID. 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 Pt admitted with diverticulitis of intestine with abscess. ? ?  ?PT Comments  ? ? Pt calm today despite being very aggressive and agitated over night. Pt continues with both cognitive and functional deficits. Pt with decreased safety awareness and decreased insight to deficits. Pt doesn't recall working with PT last week. Pt with improved ambulation tolerance however continues to veer R and requires minA for walker management. Acute PT to cont to follow. ?   ?Recommendations for follow up therapy are one component of a multi-disciplinary discharge planning process, led by the attending physician.  Recommendations may be updated based on patient status, additional functional criteria and insurance authorization. ? ?Follow Up Recommendations ? Skilled nursing-short term rehab (<3 hours/day) ?  ?  ?Assistance Recommended at Discharge Frequent or constant Supervision/Assistance  ?Patient can return home with the following Assistance with cooking/housework;Direct supervision/assist for medications management;Help with stairs or ramp for entrance;Assist for transportation;A lot of help with walking and/or transfers;A lot of help with bathing/dressing/bathroom ?  ?Equipment Recommendations ? None recommended by PT  ?  ?Recommendations for Other Services   ? ? ?  ?Precautions / Restrictions Precautions ?Precautions: Fall ?Precaution Comments: seizure, urinary incontinence, colostomy bag frequently comes off ?Restrictions ?Weight Bearing Restrictions: No  ?   ? ?Mobility ? Bed Mobility ?Overal bed mobility: Needs Assistance ?Bed Mobility: Supine to Sit ?  ?  ?Supine to sit: Min assist ?  ?  ?General bed mobility comments: pt wanting to pull up on PT despite max verbal and tactile cues to push up from bed/rail, pt kept grabbing PT's arm, modA for trunk elevation ?  ? ?Transfers ?Overall transfer level: Needs assistance ?Equipment used: Rolling walker (2 wheels) ?Transfers: Sit to/from Stand ?Sit to Stand: Min assist ?  ?  ?  ?  ?  ?General transfer comment: Assist for safety and lines, minA to steady during transition of hands, despite max verbal cues for hand placement pt pulls up on walker and doesn't reach back for chair when sitting ?  ? ?Ambulation/Gait ?Ambulation/Gait assistance: Min assist ?Gait Distance (Feet): 120 Feet ?Assistive device: Rolling walker (2 wheels) ?Gait Pattern/deviations: Step-through pattern, Decreased stride length, Narrow base of support, Drifts right/left ?Gait velocity: decr ?Gait velocity interpretation: <1.31 ft/sec, indicative of household ambulator ?  ?General Gait Details: Verbal/tactile cues to correct veering to rt without success. Min assist to steer walker due to pt running into objects on rt consistently and unable to self correct. verbal cues to stand up tall and stay in walker ? ? ?Stairs ?  ?  ?  ?  ?  ? ? ?Wheelchair Mobility ?  ? ?Modified Rankin (Stroke Patients Only) ?  ? ? ?  ?Balance Overall balance assessment: Needs assistance ?Sitting-balance support: No upper extremity supported, Feet supported ?Sitting balance-Leahy Scale: Good ?Sitting balance - Comments: able to don socks at EOB without LOB, close min guard ?  ?Standing balance support: Bilateral upper extremity supported, During functional activity ?Standing balance-Leahy Scale: Poor ?Standing balance comment: Walker and min guard for static standing ?  ?  ?  ?  ?  ?  ?  ?  ?  ?  ?  ?  ? ?  ?  Cognition Arousal/Alertness: Awake/alert ?Behavior During Therapy: [facility]  for tasks assessed/performed ?Overall Cognitive Status: No family/caregiver present to determine baseline cognitive functioning ?Area of Impairment: Safety/judgement, Awareness, Problem solving, Following commands ?  ?  ?  ?  ?  ?  ?  ?  ?  ?Current Attention Level: Sustained (easily distracted) ?Memory: Decreased recall of precautions, Decreased short-term memory ?Following Commands: Follows one step commands with increased time ?Safety/Judgement: Decreased awareness of safety, Decreased awareness of deficits ?Awareness: Emergent ?Problem Solving: Slow processing, Decreased initiation, Difficulty sequencing, Requires verbal cues ?General Comments: Pt slow to respond, at times, requiring increased cuing to stay on task ?  ?  ? ?  ?Exercises   ? ?  ?General Comments General comments (skin integrity, edema, etc.): VSS on RA ?  ?  ? ?Pertinent Vitals/Pain Pain Assessment ?Pain Assessment: No/denies pain  ? ? ?Home Living   ?  ?  ?  ?  ?  ?  ?  ?  ?  ?   ?  ?Prior Function    ?  ?  ?   ? ?PT Goals (current goals can now be found in the care plan section) Acute Rehab PT Goals ?PT Goal Formulation: With patient ?Time For Goal Achievement: 05/09/21 ?Potential to Achieve Goals: Good ?Progress towards PT goals: Progressing toward goals ? ?  ?Frequency ? ? ? Min 2X/week ? ? ? ?  ?PT Plan Current plan remains appropriate  ? ? ?Co-evaluation   ?  ?  ?  ?  ? ?  ?AM-PAC PT "6 Clicks" Mobility   ?Outcome Measure ? Help needed turning from your back to your side while in a flat bed without using bedrails?: A Little ?Help needed moving from lying on your back to sitting on the side of a flat bed without using bedrails?: A Little ?Help needed moving to and from a bed to a chair (including a wheelchair)?: A Little ?Help needed standing up from a chair using your arms (e.g., wheelchair or bedside chair)?: A Little ?Help needed to walk in hospital room?: A Little ?Help needed climbing 3-5 steps with a railing? : A Lot ?6 Click Score:  17 ? ?  ?End of Session Equipment Utilized During Treatment: Gait belt ?Activity Tolerance: Patient tolerated treatment well ?Patient left: with call bell/phone within reach;in bed;with bed alarm set;with family/visitor present ?Nurse Communication: Mobility status ?PT Visit Diagnosis: Unsteadiness on feet (R26.81);Muscle weakness (generalized) (M62.81);Difficulty in walking, not elsewhere classified (R26.2);Ataxic gait (R26.0) ?  ? ? ?Time: 3428-7681 ?PT Time Calculation (min) (ACUTE ONLY): 17 min ? ?Charges:  $Gait Training: 8-22 mins          ?          ? ?Kittie Plater, PT, DPT ?Acute Rehabilitation Services ?Pager #: [phone] ?Office #: [phone] ? ? ? ?Antavius Sperbeck M Worthy Boschert ?04/24/2021, 1:13 PM ? ?

## 2021-04-24 NOTE — Progress Notes (Signed)
Inpatient Diabetes Program Recommendations ? ?AACE/ADA: New Consensus Statement on Inpatient Glycemic Control (2015) ? ?Target Ranges:  Prepandial:   less than 140 mg/dL ?     Peak postprandial:   less than 180 mg/dL (1-2 hours) ?     Critically ill patients:  140 - 180 mg/dL  ? ?Lab Results  ?Component Value Date  ? GLUCAP 153 (H) 04/24/2021  ? HGBA1C 15.0 (H) 04/06/2021  ? ? ?Review of Glycemic Control ? Latest Reference Range & Units 04/23/21 19:23 04/23/21 23:10 04/24/21 03:39 04/24/21 08:18  ?Glucose-Capillary 70 - 99 mg/dL 305 (H) 337 (H) 422 (H) 153 (H)  ?(H): Data is abnormally high ?Diabetes history: Type 2 Dm ?Outpatient Diabetes medications: Novolog 70/30 30 units QAM, ?Current orders for Inpatient glycemic control: Novolog 0-9 units Q4H, Semglee 15 units QD ? ?Inpatient Diabetes Program Recommendations:   ? ?Consider: ?-changing correction to Novolog 0-9 units TID & HS ?-Adding Novolog 2 units TID (assuming patient is consuming >50% of meals).  ? ?Thanks, ?Bronson Curb, MSN, RNC-OB ?Diabetes Coordinator ?[phone] (8a-5p) ? ? ?

## 2021-04-25 LAB — BASIC METABOLIC PANEL
Anion gap: 7 (ref 5–15)
BUN: 26 mg/dL — ABNORMAL HIGH (ref 8–23)
CO2: 31 mmol/L (ref 22–32)
Calcium: 9.4 mg/dL (ref 8.9–10.3)
Chloride: 97 mmol/L — ABNORMAL LOW (ref 98–111)
Creatinine, Ser: 0.79 mg/dL (ref 0.44–1.00)
GFR, Estimated: >60 mL/min (ref >60)
Glucose, Bld: 160 mg/dL — ABNORMAL HIGH (ref 70–99)
Potassium: 4.7 mmol/L (ref 3.5–5.1)
Sodium: 135 mmol/L (ref 135–145)

## 2021-04-25 LAB — GLUCOSE, CAPILLARY
Glucose-Capillary: 152 mg/dL — ABNORMAL HIGH (ref 70–99)
Glucose-Capillary: 264 mg/dL — ABNORMAL HIGH (ref 70–99)
Glucose-Capillary: 263 mg/dL — ABNORMAL HIGH (ref 70–99)
Glucose-Capillary: 377 mg/dL — ABNORMAL HIGH (ref 70–99)
Glucose-Capillary: 57 mg/dL — ABNORMAL LOW (ref 70–99)
Glucose-Capillary: 89 mg/dL (ref 70–99)

## 2021-04-25 LAB — ALBUMIN: Albumin: 2.4 g/dL — ABNORMAL LOW (ref 3.5–5.0)

## 2021-04-25 MED ORDER — INSULIN GLARGINE-YFGN 100 UNIT/ML ~~LOC~~ SOLN
18.0000 [IU] | Freq: Every day | SUBCUTANEOUS | Status: DC
  Administered 2021-04-25 – 2021-05-01 (×7): 18 [IU] via SUBCUTANEOUS
  Filled 2021-04-25 (×8): qty 0.18

## 2021-04-25 MED ORDER — INSULIN ASPART 100 UNIT/ML IJ SOLN
0.0000 [IU] | Freq: Every day | INTRAMUSCULAR | Status: DC
  Administered 2021-04-26 – 2021-04-28 (×2): 4 [IU] via SUBCUTANEOUS
  Administered 2021-04-29: 2 [IU] via SUBCUTANEOUS
  Administered 2021-04-30: 3 [IU] via SUBCUTANEOUS
  Administered 2021-05-03: 2 [IU] via SUBCUTANEOUS
  Administered 2021-05-04: 5 [IU] via SUBCUTANEOUS

## 2021-04-25 MED ORDER — HALOPERIDOL LACTATE 5 MG/ML IJ SOLN
2.5000 mg | Freq: Once | INTRAMUSCULAR | Status: AC
  Administered 2021-04-25: 2.5 mg via INTRAVENOUS
  Filled 2021-04-25: qty 1

## 2021-04-25 MED ORDER — OLANZAPINE 2.5 MG PO TABS
2.5000 mg | ORAL_TABLET | Freq: Every day | ORAL | Status: DC
  Administered 2021-04-25: 2.5 mg via ORAL
  Filled 2021-04-25: qty 1

## 2021-04-25 MED ORDER — INSULIN ASPART 100 UNIT/ML IJ SOLN
0.0000 [IU] | Freq: Three times a day (TID) | INTRAMUSCULAR | Status: DC
  Administered 2021-04-25: 5 [IU] via SUBCUTANEOUS
  Administered 2021-04-25: 9 [IU] via SUBCUTANEOUS
  Administered 2021-04-26 (×2): 2 [IU] via SUBCUTANEOUS
  Administered 2021-04-26: 1 [IU] via SUBCUTANEOUS
  Administered 2021-04-27: 9 [IU] via SUBCUTANEOUS
  Administered 2021-04-27: 2 [IU] via SUBCUTANEOUS
  Administered 2021-04-28: 5 [IU] via SUBCUTANEOUS
  Administered 2021-04-29: 2 [IU] via SUBCUTANEOUS
  Administered 2021-04-30: 5 [IU] via SUBCUTANEOUS
  Administered 2021-04-30: 3 [IU] via SUBCUTANEOUS
  Administered 2021-04-30: 1 [IU] via SUBCUTANEOUS
  Administered 2021-05-01: 5 [IU] via SUBCUTANEOUS
  Administered 2021-05-01: 2 [IU] via SUBCUTANEOUS
  Administered 2021-05-01: 9 [IU] via SUBCUTANEOUS
  Administered 2021-05-02 – 2021-05-03 (×3): 2 [IU] via SUBCUTANEOUS
  Administered 2021-05-03: 1 [IU] via SUBCUTANEOUS
  Administered 2021-05-04: 5 [IU] via SUBCUTANEOUS
  Administered 2021-05-04 – 2021-05-05 (×2): 3 [IU] via SUBCUTANEOUS
  Administered 2021-05-05: 7 [IU] via SUBCUTANEOUS
  Administered 2021-05-06: 1 [IU] via SUBCUTANEOUS
  Administered 2021-05-06: 7 [IU] via SUBCUTANEOUS
  Administered 2021-05-06: 9 [IU] via SUBCUTANEOUS

## 2021-04-25 NOTE — Progress Notes (Signed)
SLP Cancellation Note ? ?Patient Details ?Name: Brittany [name] ?MRN: [id number] ?DOB: 1960-10-29 ? ? ?Cancelled treatment:       Reason Eval/Treat Not Completed: Patient's level of consciousness. NT sitter in room stating that patient is calmed down and sleeping after being agitated last night. She ate breakfast but requires cues for significant distractability. SLP will not attempt to wake her up but will continue to follow for cognitive treatment when appropriate. ? ?Sonia Baller, MA, CCC-SLP ?Speech Therapy ? ?

## 2021-04-25 NOTE — Progress Notes (Signed)
?PROGRESS NOTE ? ? ? ?Brittany [name]  HBZ:[id number] DOB: 1961/02/11 DOA: 04/05/2021 ?PCP: [facility]  ? ? ?Brief Narrative:  ?Brittany [name] is a 61 year old female with PMH significant for hypertension, diabetes mellitus, CVA with right-sided weakness, COPD, tobacco abuse, and status post pancreatectomy and splenectomy, anxiety/depression, discoid lupus erythematosus, hyperlipidemia who presented to [facility] ED on 2/22 via EMS with confusion.   ? ?Per her daughter at baseline, patient is oriented x 3. She has a history of stroke with right-sided weakness. Normally pt can use a cane to walk. This AM, pt was found to be on the floor with confusion. Pt has jerking and slurred speech per her daughter.  ?  ?Per EDP, EMS reported that the house was extremely filthy. The patient appeared to be in a soiled diaper. Pt has an colostomy with no collection bag in place. Daughter did not know which medications the patient was on.  EMS states they witnessed what could be a partial seizure, and gave the patient a total of 4 mg of intranasal Versed in route to the hospital. When I saw pt in ED. Pt was confused, not orientated x3. She has right-sided weakness which is likely from previous stroke.   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 Pt does not seem to have chest pain.  Not sure if patient has symptoms of UTI.  Her daughter is not sure if patient has a history of seizure.  Daughter states that she found a Keppra bottle which is about 62 years old, not sure if patient is taking his medications or not. ?  ?Patient was noted to have DKA, subclinical seizure on EEG and incidental finding of COVID-19 viral infection.  Neurology was consulted and patient was transferred to [facility] for continuous EEG.  Admitted to the hospital service.  Now DKA resolved.  Patient is off isolation of COVID-19.  Psychiatry consulted for agitation recommended Keppra should be changed to  Depakote. ? ?Plan is to discharge to SNF once she is off restraints for 48 hours. ?  ? ?  ?Assessment and Plan: ?* New onset seizure ([place]) ?Suspect due to lowered seizure threshold, precipitated by DKA/COVID. Neurology was consulted and followed during initial hospital course.  Discussed with psychiatry, given behavioral disturbances may be related to Keppra use, Keppra dose reduced in favor of Depakote. ?--Depakote level remains low, 23 on 3/13 ?--Increased Depakote to 500 mg BID on 3/13 ?--Continue Keppra 250 mg BID. ?--Outpatient follow-up with neurology in 2 to 3 months. ? ?Acute metabolic encephalopathy ?Improved since admission, but seems to be waxing and waning.   ?Likely complicated by hospital-acquired delirium and fluctuations in glucose due to noncompliance with diet.  ?--Continue PT/OT efforts while inpatient ?--Plan discharge to SNF for rehab and then may need to live with her sister as she is likely unable to manage her ADLs at home alone. ? ?DKA (diabetic ketoacidosis) ([place]) ?DKA resolved.  Hemoglobin A1c 15.0 consistent with noncompliance. ?Now transitioned to subcu insulin.   ?--Diabetic educator following, appreciate assistance ?--Insulin as below ? ?Type 2 diabetes mellitus with hyperlipidemia ([place]) ?HbA1c 15.0> poor control. Suspect noncompliance. ?--Semglee 18 units daily. ?--NovoLog 2 units TID AC ?--Sliding scale for coverage ?--CBGs qAC/HS ? ? ?Essential hypertension ?Benazepril discontinued due to hyperkalemia. ?--BP stable off of antihypertensives ? ?Hypokalemia; hyperkalemia ?Initially repleted during this hospitalization.  Potassium increased and was given Lokelma.  Benazepril discontinued. ?--K 4.7 today ? ?Restless leg syndrome ?Continue ropinirole. ? ?  Elevated troponin ?Etiology likely secondary to type II demand ischemia in the setting of DKA and seizure as above.  EKG with no concerning dynamic changes.  No concerning arrhythmias noted on telemetry, now discontinued.   ? ?History of  stroke ?Continue statin. ?Not on aspirin/Plavix outpatient ? ?Hypomagnesemia ?Replaced.  Continue to monitor ? ?COVID-19 virus infection ?Incidental finding with+ PCR on 04/05/2021.  Completed 10 days of airborne/contact isolation, now discontinued on 3/5 ? ?Status post colostomy [facility]) ?Wound care consulted for continued colostomy recommendations while inpatient. ? ?COPD, mild ([place]) ?Stable.  Currently on room air ?--Continue albuterol MDI 1-2 puffs q4h PRN SOB/wheezing.  ? ?Protein-calorie malnutrition, severe ?Nutrition Status: ?Nutrition Problem: Severe Malnutrition ?Etiology: chronic illness (cancer, lupus) ?Signs/Symptoms: severe muscle depletion, severe fat depletion, percent weight loss ?Percent weight loss: 16.5 % (x5 months) ?Seen by nutrition.  Regular diet with thin liquids.  Continue to encourage increased supplementation. ? ?Pressure injury of skin ?Continue local wound care, offloading. ? ?Behavior related to cognitive impairment ?--Psychiatry following, appreciate assistance ?--Continue Depakote 500 mg BID ?--Continue clonazepam 0.'25mg'$  BID ?--Continue melatonin '6mg'$  PO qHS ?--Starting olanzapine 2.5 mg PO qHS 3/14 (qTC 458 3/13) ? ? ?Hyperkalemia-resolved as of 04/20/2021 ?Benazepril discontinued.  Now potassium improved. ?Discontinued Lokelma ?--K 4.7 this am ?--Continue intermittent monitoring of BMP ? ? ? ?DVT prophylaxis:   Lovenox ?  Code Status: Full Code ?Family Communication: No family present at bedside this morning ? ?Disposition Plan:  ?Level of care: Med-Surg ?Status is: Inpatient ?Remains inpatient appropriate because: Pending SNF placement ? ?  ? ?Consultants:  ?Neurology - signed off 2/27 ? ?Procedures:  ?EEG ? ?Antimicrobials:  ?None ? ? ?Subjective: ?Patient seen examined bedside, sitting at edge of bed eating breakfast.  Sitter present.  Overnight replacing restraints for recurrent agitation.  Patient continues to be confused, but confusion has been stable for several days.  Depakote  increased yesterday, will start olanzapine tonight.  Psychiatry to reevaluate today.  Patient with no other complaints or concerns at this time.  Denies headache, no chest pain, no shortness of breath, no abdominal pain.  No other acute events overnight per nursing staff. ? ?Apparently following breakfast patient was ambulated to the bathroom with the help of staff and when attempts to assist her with changing of close she became agitated once again trying to fight staff and was replaced in soft wrist restraints and lap belt back to her bed. ? ? ?Objective: ?Vitals:  ? 04/24/21 2312 04/25/21 0316 04/25/21 0735 04/25/21 1115  ?BP: 126/86 122/86 111/82 112/83  ?Pulse: 100 97 100 94  ?Resp: '18 15 18 18  '$ ?Temp: 97.6 ?F (36.4 ?C) (!) 97.4 ?F (36.3 ?C) 98.3 ?F (36.8 ?C) 98.2 ?F (36.8 ?C)  ?TempSrc: Axillary Axillary    ?SpO2: 94% 98% 99%   ?Weight:      ?Height:      ? ? ?Intake/Output Summary (Last 24 hours) at 04/25/2021 1123 ?Last data filed at 04/25/2021 3976 ?Gross per 24 hour  ?Intake 247 ml  ?Output 1375 ml  ?Net -1128 ml  ? ? ?Filed Weights  ? 04/21/21 0500 04/22/21 0500 04/24/21 0409  ?Weight: 43 kg 44.9 kg 45.9 kg  ? ? ?Examination: ? ?Physical Exam: ?GEN: NAD, alert, pleasantly confused, oriented only to time (2023) not oriented to person (no answer to [place] of the [place], place (home) or situation, but oriented/time or situation. thin/cachectic, chronically ill in appearance, appears older than stated age ?HEENT: NCAT, PERRL, EOMI, sclera clear, MMM ?PULM:  CTAB w/o wheezes/crackles, normal respiratory effort, on room air ?CV: RRR w/o M/G/R ?GI: abd soft, NTND, NABS, no R/G/M, colostomy bag noted with soft brown stool in collection bag ?MSK: no peripheral edema, muscle strength globally intact 5/5 bilateral upper/lower extremities ?NEURO: CN II-XII intact, no focal deficits, sensation to light touch intact ?PSYCH: normal mood/affect ?Integumentary: Stage II wounds noted left buttock likely from  excoriation ? ? ? ?Data Reviewed: I have personally reviewed following labs and imaging studies ? ?CBC: ?Recent Labs  ?Lab 04/20/21 ?0329 04/22/21 ?0142 04/23/21 ?0030  ?WBC 14.9* 8.6 7.0  ?NEUTROABS  --   --  2.7  ?HG

## 2021-04-25 NOTE — Progress Notes (Signed)
Occupational Therapy Treatment ?Patient Details ?Name: Brittany [name] ?MRN: [id number] ?DOB: 1961-01-07 ?Today's Date: 04/25/2021 ? ? ?History of present illness Brittany [name] is a 36yoF who was brought to the ED for confusion plus DKA.  EEG noted subclinical seizure.  Also found to have incidental COVID. 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 Pt admitted with diverticulitis of intestine with abscess. ?  ?OT comments ? Brittany [name] is incrementally progressing. Pt was initially pleasant and following most commands, being motivated by food. However with attempts to groom EOB or complete tasks that were not sought out by the pt herself she became more agitated. She was easily re-directed to her lunch, however due to aggressive behavior prior to the session pt's lap belt was donned throughout for safety and session completed at EOB level. Pt continues to benefit from OT acutely, goals updated. D/c remains appropriate.   ? ?Recommendations for follow up therapy are one component of a multi-disciplinary discharge planning process, led by the attending physician.  Recommendations may be updated based on patient status, additional functional criteria and insurance authorization. ?   ?Follow Up Recommendations ? Skilled nursing-short term rehab (<3 hours/day)  ?  ?Assistance Recommended at Discharge Frequent or constant Supervision/Assistance  ?Patient can return home with the following ? A little help with walking and/or transfers;A little help with bathing/dressing/bathroom;Assistance with cooking/housework;Assistance with feeding;Direct supervision/assist for medications management;Direct supervision/assist for financial management;Assist for transportation;Help with stairs or ramp for entrance ?  ?Equipment Recommendations ? None recommended by OT  ?  ?Recommendations for Other Services   ? ?   ?Precautions / Restrictions Precautions ?Precautions: Fall ?Precaution Comments: seizure, urinary incontinence, colostomy bag frequently comes off ?Restrictions ?Weight Bearing Restrictions: No  ? ? ?  ? ?Mobility Bed Mobility ?Overal bed mobility: Needs Assistance ?Bed Mobility: Supine to Sit ?  ?  ?Supine to sit: Supervision ?  ?  ?General bed mobility comments: no assist to get to EOB with lap belt donned ?  ? ?Transfers ?Overall transfer level: Needs assistance ?Equipment used: None ?  ?  ?  ?  ?  ?  ?  ?General transfer comment: unable to attempt for safety, lap belt to bed remained donned ?  ?  ?Balance Overall balance assessment: Needs assistance ?Sitting-balance support: Feet supported, No upper extremity supported ?Sitting balance-Leahy Scale: Good ?Sitting balance - Comments: ADLs while sitting EOB without LOB ?  ?  ?  ?  ?  ?  ?  ?  ?  ?  ?  ?  ?  ?  ?  ?   ? ?ADL either performed or assessed with clinical judgement  ? ?ADL Overall ADL's : Needs assistance/impaired ?Eating/Feeding: Supervision/ safety;Sitting ?  ?Grooming: Wash/dry hands;Wash/dry face;Set up;Sitting ?Grooming Details (indicate cue type and reason): completed EOB ?  ?  ?  ?  ?  ?  ?  ?  ?  ?  ?  ?  ?  ?  ?  ?General ADL Comments: did not attempt OOB due to safety. Pt has been extremely agressive and difficult to re-direct when OOB. session completed at bed level with lap belt donned for safety of staff and pt ?  ? ?Extremity/Trunk Assessment Upper Extremity Assessment ?RUE Deficits / Details: WFL ROM (less shoulder ROM than left), 4-/5 shoulder strength, elbow strength 4/5, wrist 5/5, grip 4/5 - using functionally for self  feeding and grooming ?RUE Sensation: decreased light touch ?RUE Coordination: decreased fine motor;decreased gross motor ?LUE Deficits / Details: WFL ROM, 5/5 strength ?LUE Sensation: WNL ?LUE Coordination: WNL ?  ?Lower Extremity Assessment ?Lower Extremity Assessment: Defer to PT evaluation ?  ?  ?  ? ?Vision    ?Vision Assessment?: No apparent visual deficits ?  ?Perception Perception ?Perception: Not tested ?  ?Praxis Praxis ?Praxis: Not tested ?  ? ?Cognition Arousal/Alertness: Awake/alert ?Behavior During Therapy: [facility] for tasks assessed/performed ?Overall Cognitive Status: No family/caregiver present to determine baseline cognitive functioning ?Area of Impairment: Following commands, Safety/judgement, Awareness, Problem solving ?  ?  ?  ?  ?  ?  ?  ?  ?Orientation Level: Disoriented to, Time, Situation ?Current Attention Level: Sustained ?Memory: Decreased recall of precautions, Decreased short-term memory ?Following Commands: Follows one step commands with increased time ?Safety/Judgement: Decreased awareness of safety, Decreased awareness of deficits ?Awareness: Emergent ?Problem Solving: Slow processing, Decreased initiation, Difficulty sequencing, Requires verbal cues ?General Comments: pt pleasant most of the session, motivated by food. follows most commands, easily agitated. continues to curse at the staff ?  ?  ?   ?Exercises   ? ?  ?Shoulder Instructions   ? ? ?  ?General Comments VSS on RA, pt extremely agressive with staff when assisted OOB - session completed at the EOB with lap bed donned for safety  ? ? ?Pertinent Vitals/ Pain       Pain Assessment ?Pain Assessment: No/denies pain ?Pain Intervention(s): Monitored during session ? ? ?Frequency ? Min 2X/week  ? ? ? ? ?  ?Progress Toward Goals ? ?OT Goals(current goals can now be found in the care plan section) ? Progress towards OT goals: Progressing toward goals ? ?Acute Rehab OT Goals ?OT Goal Formulation: With patient ?Time For Goal Achievement: 04/25/21 ?Potential to Achieve Goals: Good ?ADL Goals ?Pt Will Perform Upper Body Dressing: Independently;sitting ?Pt Will Perform Lower Body Dressing: with modified independence;sit to/from stand ?Pt Will Transfer to Toilet: with modified independence;ambulating ?Additional ADL Goal #1: pt will indep follow 3  step directional task as a precursor to safe ADLs  ?Plan Discharge plan remains appropriate   ? ?   ?AM-PAC OT "6 Clicks" Daily Activity     ?Outcome Measure ? ? Help from another person eating meals?: A Little ?Help from another person taking care of personal grooming?: A Little ?Help from another person toileting, which includes using toliet, bedpan, or urinal?: A Little ?Help from another person bathing (including washing, rinsing, drying)?: A Little ?Help from another person to put on and taking off regular upper body clothing?: A Little ?Help from another person to put on and taking off regular lower body clothing?: A Little ?6 Click Score: 18 ? ?  ?End of Session   ? ?OT Visit Diagnosis: Unsteadiness on feet (R26.81);Other abnormalities of gait and mobility (R26.89);Muscle weakness (generalized) (M62.81) ?  ?Activity Tolerance Patient tolerated treatment well ?  ?Patient Left in bed;with call bell/phone within reach;with bed alarm set;with restraints reapplied ?  ?Nurse Communication Mobility status ?  ? ?   ? ?Time: 1350-1420 ?OT Time Calculation (min): 30 min ? ?Charges: OT General Charges ?$OT Visit: 1 Visit ?OT Treatments ?$Self Care/Home Management : 23-37 mins ? ? ? ?Tandy Lewin A Georgian Mcclory ?04/25/2021, 2:38 PM ?

## 2021-04-25 NOTE — Progress Notes (Addendum)
HOSPITAL MEDICINE OVERNIGHT EVENT NOTE   ? ?Nursing reports that patient is once again acting extremely agitated this evening, regularly attempting to get out of bed, regularly pulling at medical devices. ? ?This is despite nursing regularly rounding on the patient.  Patient is additionally received Depakote, clonazepam and melatonin as well as a dose of 5 mg of intravenous Haldol earlier in the shift.  Patient is afebrile and does not seem to be in pain. ? ?Nursing aide is serving as a Actuary currently with the patient regularly attempting to strike at them. ? ?We will go ahead and administer an additional dose of 2.5 mg of intravenous Haldol and reassess.  Patient may unfortunately require restraints if this is unsuccessful. ? ?Brittany Emerald  MD ?[facility]  ? ?ADDENDUM (3/14 1am) ? ?Patient has continued to be extremely agitated despite the Haldol.  Patient continues to attempt to get out of bed and strike at staff. ? ?Initiating soft wrist restraints and a lap belt. Will discontinue as soon as able.  Monitor closely. ? ?Brittany [name] ? ? ? ? ? ? ? ? ? ? ? ? ? ?

## 2021-04-25 NOTE — Consult Note (Addendum)
I have independently evaluated the patient during a face-to-face assessment on 04/26/21. I reviewed the patient's chart, and I participated in key portions of the service. I discussed the case with the Brittany [name], and I agree with the assessment and plan of care as documented in the House Officer's note.  ? ?Have made minor edits to body of note below.  ? ?Brittany Fruit, MD ? ? ?[facility] Followup Face-to-Face Psychiatric Evaluation ? ? ?Service Date: April 25, 2021 ?LOS:  LOS: 20 days  ? ? ?Assessment  ?Brittany [name] is a 61 y.o. female admitted medically for 04/05/2021  9:28 PM for DKA. She carries the psychiatric diagnoses of  and has a past medical history of MDD and anxiety.Psychiatry was consulted for agitation and medication mgmt by Brittany [place] MD.  ?  ?  ?Her current presentation of waxing and waning agitation is most consistent with delirium. Patient will require a reassessment in the AM. Currently her Outpatient psychotropic medication are unknown but may include Remeron '15mg'$  QHS and gabapentin '300mg'$  TID. It is unknown if patient is compliant. On initial examination, patient patient is pleasant disoriented but redirectable and attempting participate in assessment. Please see plan below for detailed recommendations.  ? ?Patient is elderly and frail; therefore will titrate up slowly on antipsychotic therapy, tonight will be first night with medication scheduled. Had not started antipsychotics when she came to the hospital in hope that Depakote would curb behaviors and 2/2 prolonged Qtc; However patient continues to present delirious and increased behavior outbursts with normalization of Qtc; benefit>risk at this time. Marland Kitchen  ?Diagnoses:  ?Active Hospital problems: ?Principal Problem: ?  New onset seizure (Brittany [name]) ?Active Problems: ?  COPD, mild ([place]) ?  Type 2 diabetes mellitus with hyperlipidemia ([place]) ?  Essential hypertension ?  Restless leg syndrome ?  DKA (diabetic ketoacidosis)  ([place]) ?  Hypokalemia; hyperkalemia ?  Acute metabolic encephalopathy ?  Protein-calorie malnutrition, severe ?  Elevated troponin ?  History of stroke ?  Hypomagnesemia ?  COVID-19 virus infection ?  Status post colostomy [facility]) ?  Pressure injury of skin ?  Behavior related to cognitive impairment ?  ? ? ?Plan  ?## Safety and Observation Level:  ?- Based on my clinical evaluation, I estimate the patient to be at low risk of self harm in the current setting ?- At this time, we recommend a routine level of observation. This decision is based on my review of the chart including patient's history and current presentation, interview of the patient, mental status examination, and consideration of suicide risk including evaluating suicidal ideation, plan, intent, suicidal or self-harm behaviors, risk factors, and protective factors. This judgment is based on our ability to directly address suicide risk, implement suicide prevention strategies and develop a safety plan while the patient is in the clinical setting. Please contact our team if there is a concern that risk level has changed. ?  ?  ?## Medications:  ?-- Continue Remeron 7.'5mg'$  QHS ?-- Continue Depakote '500mg'$  BID ?-- Depakote lvl 3/16 CMP lvl 3/16 ?-- Melatonin '6mg'$  ?-- Start Zyprexa 2.'5mg'$  QHS ?  ?## Medical Decision Making Capacity:  ?-- Not formally assessed ?  ?## Further Work-up:  ?-- Recommend UA ?  ?  ?--Most recent EKG 3/13 with QTC of 458 and HR 113 ?-- Pertinent labwork reviewed earlier this admission includes: -- CBC- WBC 14.9/Hgb 10.2/ PLT 584, CMP- K+ >7.5>>> 4.6, Mg- 1.6, BGL 288 ?  ?## Disposition:  ?-- Per primary ?  ?##  Behavioral / Environmental:  ?-- Verbal redirection before Agitation Protocol ?  ?##Legal Status ?  ?  ?Thank you for this consult request. Recommendations have been communicated to the primary team.  We will continue to follow at this time.  ?  ?PGY-2 ?Brittany Busman, MD ? ? ? followup history  ?Relevant Aspects of Hospital Course:   ?Admitted on 04/05/2021 for DKA. ? ?Patient Report:  ?Recent HPI: ?Pt seen in the AM. At the beginning of the interview Brittany [name] is sitting up in her bed. When asked what she's doing, she says she's "trying not to cuss her Brittany [name] out." RN reports that she had a phone call with her Brittany [name] earlier this morning that made her mad. Brittany [name] states that it is her Brittany [name]'s 38th birthday, and Brittany [name] says she herself is 61 years old. Brittany [name] eating breakfast when it arrives for latter part of interview. Patient has been eating well throughout hospitalization.  ? ?Brittany [name] is oriented to year but not month and date(?). She is not oriented to place and repeatedly states that she is at home. She does not think it is strange that the interviewing team is dressed in scrubs in her "home." Patient denies SI and AH. [name] recalls hitting people last night and that she was "pissed off." She does not recall feeling scared. Brittany [name] reports seeing "other people" in the form of visual hallucinations. When asked if she wants to kill anyone, she says she wants to kill "him." She says "he" works at the hospital. She says that she knows she can't kill anyone though, and that she's "mad." ? ?Per RN, Brittany [name] "almost did not sleep at all" last night and had hallucinated overnight. ? ? ?Collateral information:  ?Had seen a psychiatrist in [place]. Was given some medication to "calm her down" - clarifies this was because of anger not anxiety. States she has not slept in some time (does have a hx of 1-2 nights without sleep in her 20s). ?  ?Psychiatric History:  ?Information collected from EMR and patient. ?Previous Meds for depression: Celexa '10mg'$  (failed) Zoloft '25mg'$ , Wellbutrin XL '300mg'$ , Effexor XR '150mg'$  (failed), Prozac '20mg'$ Trazodone '25mg'$  QHS PRN ?Prior OP Psych: Dr. Valeda Malm in [place] in 2016-2018 ?  ?  ?Family psych history:  ?Brittany [name]: Anxiety disorder ?  ?  ?Social History:  ?Smokes 1/1 ppd. No EtOH. No marijuana - some years and years ago. No cociane,  heroin, meth.  ?  ?The patient's family history includes Anxiety disorder in her Brittany [name]; Breast cancer in her maternal aunt. ? ? ?Medical History: ?Past Medical History:  ?Diagnosis Date  ? Cancer [facility])   ? Collagen vascular disease ([place])   ? Diabetes mellitus without complication ([place])   ? Hypertension   ? Lupus ([place])   ? 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 ? ?Surgical History: ?Past Surgical History:  ?Procedure Laterality Date  ? COLECTOMY WITH COLOSTOMY CREATION/HARTMANN PROCEDURE N/A 03/10/2021  ? Procedure: COLECTOMY WITH COLOSTOMY CREATION/HARTMANN PROCEDURE;  Surgeon: Jules Husbands, MD;  Location: [facility] ORS;  Service: General;  Laterality: N/A;  ? PANCREATECTOMY    ? spleenectomy    ? ? ?Medications:  ? ?Current Facility-Administered Medications:  ?  acetaminophen (TYLENOL) tablet 650 mg, 650 mg, Oral, Q6H PRN, 650 mg at 04/24/21 2106 **OR** acetaminophen (TYLENOL) suppository 650 mg, 650 mg, Rectal, Q6H PRN, Annita Brod, MD ?  albuterol (VENTOLIN HFA)  108 (90 Base) MCG/ACT inhaler 1-2 puff, 1-2 puff, Inhalation, Q4H PRN, Annita Brod, MD ?  atorvastatin (LIPITOR) tablet 40 mg, 40 mg, Oral, QHS, [place] Donnamarie Poag, DO, 40 mg at 04/24/21 2106 ?  camphor-menthol (SARNA) lotion, , Topical, PRN, Annita Brod, MD, Given at 04/10/21 2340 ?  clonazePAM (KLONOPIN) disintegrating tablet 0.25 mg, 0.25 mg, Oral, BID, McQuilla, Jai B, MD, 0.25 mg at 04/25/21 1113 ?  dextrose 50 % solution 0-50 mL, 0-50 mL, Intravenous, PRN, Annita Brod, MD, 25 mL at 04/19/21 1833 ?  divalproex (DEPAKOTE) DR tablet 500 mg, 500 mg, Oral, Q12H, [place] Donnamarie Poag, DO, 500 mg at 04/25/21 1113 ?  enoxaparin (LOVENOX) 100 mg/mL injection 20 mg, 20 mg, Subcutaneous, Q24H, Annita Brod, MD, 20 mg at 04/25/21 1113 ?  feeding supplement (GLUCERNA SHAKE) (GLUCERNA SHAKE) liquid 237 mL, 237 mL, Oral, TID BM, Annita Brod,  MD, 237 mL at 04/25/21 1430 ?  Gerhardt's butt cream, , Topical, QID, [place] Donnamarie Poag, DO, Given at 04/25/21 1430 ?  haloperidol lactate (HALDOL) injection 5 mg, 5 mg, Intravenous, Q6H PRN, [place] Er

## 2021-04-25 NOTE — Progress Notes (Signed)
Writer and NT ambulated pt to the bathroom. Pt was calm and cooperative since shift change. When Probation officer and NT tried to assist pt in changing her brief, pt grew agitated and combative. Pt began trying to hit, bit and scratch/grab staff and was unable to be verbally deescalated.  ? ?NT and writer put pt back to bed and applied bilateral soft wrist restraints and soft waist restraint, with pt continuing attempts at harming staff the entire time. RN called to room to assist. Pt grabbed writers arm and would not release while digging her nails into skin. Pt also grabbed RN's badge and scratched arm.  ? ?Pt was given IV haldol by RN.  ? ?Dr. Lovette Cliche at the bedside to assess pt.  ? ?Will continue to monitor.  ?

## 2021-04-25 NOTE — Progress Notes (Signed)
Inpatient Diabetes Program Recommendations ? ?AACE/ADA: New Consensus Statement on Inpatient Glycemic Control (2015) ? ?Target Ranges:  Prepandial:   less than 140 mg/dL ?     Peak postprandial:   less than 180 mg/dL (1-2 hours) ?     Critically ill patients:  140 - 180 mg/dL  ? ?Lab Results  ?Component Value Date  ? GLUCAP 152 (H) 04/25/2021  ? HGBA1C 15.0 (H) 04/06/2021  ? ? ?Review of Glycemic Control ? Latest Reference Range & Units 04/24/21 15:34 04/24/21 20:33 04/24/21 23:15 04/25/21 03:14  ?Glucose-Capillary 70 - 99 mg/dL 198 (H) 278 (H) 157 (H) 152 (H)  ?(H): Data is abnormally high ?Diabetes history: Type 2 Dm ?Outpatient Diabetes medications: Novolog 70/30 30 units QAM, ?Current orders for Inpatient glycemic control: Novolog 0-9 units Q4H, Semglee 15 units QD ?  ?Inpatient Diabetes Program Recommendations:   ? ?Consider increasing Semglee to 18 units QD and changing correction to Novolog 0-9 units TID & HS.  ? ?Thanks, ?Bronson Curb, MSN, RNC-OB ?Diabetes Coordinator ?[phone] (8a-5p) ? ? ? ?

## 2021-04-25 NOTE — Progress Notes (Signed)
Pt has been getting OOB constantly with unsteady gait; pulling on her IV and hitting nurse tech who was trying to redirect/reorient her. Pt was packing two plastic bags full of belonging and trying to hold that and walk without walker stating "I am trying to leave. [name] is down there". Care nurse was in the patient's room with nurse tech for more than an hour trying to calm patient down with foot massage because she was c/o foot pain, with relaxing music on the TV and constant reorientation. Pt redirected back to bad by charge RN, RN, and NT. While care RN was trying reposition patient's legs back in the bad, patient kicked this care RN on the mid of her chest with force and then on multiple occasion hit care RN with hand and digging her nails in the care nurse's arm while she got the hold of RN arm.  ? ?Provider evaluated patient at bedside and placed order for bilateral soft wrist restraints and soft waist belt. Care RN and another RN applied the restraint.    ?

## 2021-04-26 LAB — CREATININE, SERUM
Creatinine, Ser: 0.76 mg/dL (ref 0.44–1.00)
GFR, Estimated: >60 mL/min (ref >60)

## 2021-04-26 LAB — GLUCOSE, CAPILLARY
Glucose-Capillary: 172 mg/dL — ABNORMAL HIGH (ref 70–99)
Glucose-Capillary: 131 mg/dL — ABNORMAL HIGH (ref 70–99)
Glucose-Capillary: 153 mg/dL — ABNORMAL HIGH (ref 70–99)
Glucose-Capillary: 322 mg/dL — ABNORMAL HIGH (ref 70–99)

## 2021-04-26 MED ORDER — OLANZAPINE 2.5 MG PO TABS
5.0000 mg | ORAL_TABLET | Freq: Every day | ORAL | Status: DC
Start: 2021-04-26 — End: 2021-04-30
  Administered 2021-04-26 – 2021-04-29 (×4): 5 mg via ORAL
  Filled 2021-04-26 (×4): qty 2

## 2021-04-26 NOTE — Progress Notes (Signed)
?PROGRESS NOTE ? ? ? ?Brittany [name]  BTD:[id number] DOB: [birth year] DOA: 04/05/2021 ?PCP: [facility]  ? ? ?Brief Narrative:  ?Brittany [name] is a 61 year old female with PMH significant for hypertension, diabetes mellitus, CVA with right-sided weakness, COPD, tobacco abuse, and status post pancreatectomy and splenectomy, anxiety/depression, discoid lupus erythematosus, hyperlipidemia who presented to [facility] ED on 2/22 via EMS with confusion.   ? ?Per her daughter at baseline, patient is oriented x 3. She has a history of stroke with right-sided weakness. Normally pt can use a cane to walk. This AM, pt was found to be on the floor with confusion. Pt has jerking and slurred speech per her daughter.  ?  ?Per EDP, EMS reported that the house was extremely filthy. The patient appeared to be in a soiled diaper. Pt has an colostomy with no collection bag in place. Daughter did not know which medications the patient was on.  EMS states they witnessed what could be a partial seizure, and gave the patient a total of 4 mg of intranasal Versed in route to the hospital. When I saw pt in ED. Pt was confused, not orientated x3. She has right-sided weakness which is likely from previous stroke.   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 Pt does not seem to have chest pain.  Not sure if patient has symptoms of UTI.  Her daughter is not sure if patient has a history of seizure.  Daughter states that she found a Keppra bottle which is about 62 years old, not sure if patient is taking his medications or not. ?  ?Patient was noted to have DKA, subclinical seizure on EEG and incidental finding of COVID-19 viral infection.  Neurology was consulted and patient was transferred to [facility] for continuous EEG.  Admitted to the hospital service.  Now DKA resolved.  Patient is off isolation of COVID-19.  Psychiatry consulted for agitation recommended Keppra should be changed to  Depakote. ? ?Plan is to discharge to SNF once she is off restraints for 48 hours. ?  ? ?  ?Assessment and Plan: ?* New onset seizure ([place]) ?Suspect due to lowered seizure threshold, precipitated by DKA/COVID. Neurology was consulted and followed during initial hospital course.  Discussed with psychiatry, given behavioral disturbances may be related to Keppra use, Keppra dose reduced in favor of Depakote. ?--Depakote level remains low, 23 on 3/13 ?--Increased Depakote to 500 mg BID on 3/13 ?--Continue Keppra 250 mg BID. ?--Outpatient follow-up with neurology in 2 to 3 months. ? ?Acute metabolic encephalopathy ?Improved since admission, but seems to be waxing and waning.   ?Likely complicated by hospital-acquired delirium and fluctuations in glucose due to noncompliance with diet.  ?--Continue PT/OT efforts while inpatient ?--Plan discharge to SNF for rehab and then may need to live with her sister as she is likely unable to manage her ADLs at home alone. ? ?DKA (diabetic ketoacidosis) ([place]) ?DKA resolved.  Hemoglobin A1c 15.0 consistent with noncompliance. ?Now transitioned to subcu insulin.   ?--Diabetic educator following, appreciate assistance ?--Insulin as below ? ?Type 2 diabetes mellitus with hyperlipidemia ([place]) ?HbA1c 15.0> poor control. Suspect noncompliance. ?--Semglee 18 units daily. ?--NovoLog 2 units TID AC ?--Sliding scale for coverage ?--CBGs qAC/HS ? ? ?Essential hypertension ?Benazepril discontinued due to hyperkalemia. ?--BP stable off of antihypertensives ? ?Hypokalemia; hyperkalemia ?Initially repleted during this hospitalization.  Potassium increased and was given Lokelma.  Benazepril discontinued. ?--K 4.7 on 3/14 ? ?Restless leg syndrome ?Continue  ropinirole. ? ?Elevated troponin ?Etiology likely secondary to type II demand ischemia in the setting of DKA and seizure as above.  EKG with no concerning dynamic changes.  No concerning arrhythmias noted on telemetry, now discontinued.   ? ?History  of stroke ?Continue statin. ?Not on aspirin/Plavix outpatient ? ?Hypomagnesemia ?Replaced.  Continue to monitor ? ?COVID-19 virus infection ?Incidental finding with+ PCR on 04/05/2021.  Completed 10 days of airborne/contact isolation, now discontinued on 3/5 ? ?Status post colostomy [facility]) ?Wound care consulted for continued colostomy recommendations while inpatient. ? ?COPD, mild ([place]) ?Stable.  Currently on room air ?--Continue albuterol MDI 1-2 puffs q4h PRN SOB/wheezing.  ? ?Protein-calorie malnutrition, severe ?Nutrition Status: ?Nutrition Problem: Severe Malnutrition ?Etiology: chronic illness (cancer, lupus) ?Signs/Symptoms: severe muscle depletion, severe fat depletion, percent weight loss ?Percent weight loss: 16.5 % (x5 months) ?Seen by nutrition.  Regular diet with thin liquids.  Continue to encourage increased supplementation. ? ?Pressure injury of skin ?Continue local wound care, offloading. ? 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 ? ?Hyperkalemia-resolved as of 04/20/2021 ?Benazepril discontinued.  Now potassium improved. ?Discontinued Lokelma ?--K 4.7 this am ?--Continue intermittent monitoring of BMP ? ? ? ?DVT prophylaxis:   Lovenox ?  Code Status: Full Code ?Family Communication: No family present at bedside this morning ? ?Disposition Plan:  ?Level of care: Med-Surg ?Status is: Inpatient ?Remains inpatient appropriate because: Pending SNF placement ? ?  ? ?Consultants:  ?Neurology - signed off 2/27 ? ?Procedures:  ?EEG ? ?Antimicrobials:  ?None ? ? ?Subjective: ?Patient seen and examined at bedside, sitting at edge of bed with lap belt, bilateral wrist restraints and tele-sitter present.  RN also present at bedside.  Patient  reports that she is hungry.  No other specific complaints at this time.  She remains confused and only oriented to year.  Continues with agitation overnight despite start of Zyprexa last night. Denies headache, no chest pain, no shortness of breath, no abdominal pain.  No other acute events overnight per nursing staff. ? ? ? ?Objective: ?Vitals:  ? 04/25/21 2333 04/25/21 2335 04/26/21 0203 04/26/21 0314  ?BP: 94/67 93/70 (!) 123/112 125/72  ?Pulse: (!) 104 (!) 103 (!) 119 (!) 102  ?Resp: '18  18 18  '$ ?Temp: 98.9 ?F (37.2 ?C)  98 ?F (36.7 ?C) 98.1 ?F (36.7 ?C)  ?TempSrc: Oral  Oral Oral  ?SpO2:      ?Weight:      ?Height:      ? ? ?Intake/Output Summary (Last 24 hours) at 04/26/2021 1049 ?Last data filed at 04/26/2021 0900 ?Gross per 24 hour  ?Intake 240 ml  ?Output --  ?Net 240 ml  ? ? ?Filed Weights  ? 04/21/21 0500 04/22/21 0500 04/24/21 0409  ?Weight: 43 kg 44.9 kg 45.9 kg  ? ? ?Examination: ? ?Physical Exam: ?GEN: NAD, alert, pleasantly confused, oriented only to time (2023) not oriented to person (no answer to [place] the [place], place (home) or situation, thin/cachectic, chronically ill in appearance, appears older than stated age ?HEENT: NCAT, PERRL, EOMI, sclera clear, MMM ?PULM: CTAB w/o wheezes/crackles, normal respiratory effort, on room air ?CV: RRR w/o M/G/R ?GI: abd soft, NTND, NABS, no R/G/M, colostomy bag noted with soft brown stool in collection bag ?MSK: no peripheral edema, muscle strength globally intact 5/5 bilateral upper/lower  extremities ?NEURO: CN II-XII intact, no focal deficits, sensation to light touch intact ?PSYCH: normal mood/affect ?Integumentary: Stage II wounds noted left buttock likely from excoriation ? ? ? ?Data Reviewed: I have personally reviewed following labs and imaging studies ? ?CBC: ?Recent Labs  ?Lab 04/20/21 ?0329 04/22/21 ?0142 04/23/21 ?0030  ?WBC 14.9* 8.6 7.0  ?NEUTROABS  --   --  2.7  ?HGB 10.2* 9.2* 9.1*  ?HCT 30.9* 29.1* 28.3*  ?MCV 89.0 90.7 90.1  ?PLT  584* 605* 621*  ? ?Basic Metabolic Panel: ?Recent Labs  ?Lab 04/20/21 ?0147 04/22/21 ?0142 04/23/21 ?0030 04/24/21 ?0301 04/25/21 ?8421 04/26/21 ?0612  ?NA 138 141 137 135 135  --   ?K 4.6 3.8 4.4 5.3* 4.7  --   ?

## 2021-04-26 NOTE — Consult Note (Signed)
Brief Psychiatry Note ? ?Attempted to see patient 2 times today; however on both occassions patient was too sedated to interact. Spoke with Engineer, materials. Both endorse that patient was waking up intermittently asking for things or going to the bathroom and then falling asleep. RN endorsed that this AM prior to psychiatry first attempt, patient was more agitated. RN gave patient scheduled Klonopin and patient improved without need to PRN antipsychotics. RN reports that he received new that patient had been difficult overnight, in his shift report handoff.  ? ?Assessment and Plan ? ?Based on RN report patient would benefit from increase in Zyprexa. Will attempt to reassess tom.  ? ?-- Continue delirium precautions ?-- Would like to discontinue Klonopin, soon ?-- Increase Zyprexa to '5mg'$  QHS ?-- Depakote lvl and CMP 3/16 ? ? ?PGY-2 ?Damita Dunnings, MD ?

## 2021-04-26 NOTE — Progress Notes (Signed)
Speech Language Pathology Treatment: Cognitive-Linquistic  ?Patient Details ?Name: Brittany [name] ?MRN: [id number] ?DOB: [birth year] ?Today's Date: 04/26/2021 ?Time: 3976-7341 ?SLP Time Calculation (min) (ACUTE ONLY): 9 min ? ?Assessment / Plan / Recommendation ?Clinical Impression ? Pt was seen for cognitive-linguistic treatment. She required orientation to place, but was partially oriented to time. She completed a simple reasoning task with repetition and cues. She required moderate support for sequencing and exhibited difficulty maintaining an adequate level of alertness for this and other tasks. The session was ultimately abbreviated due to pt's persistent lethargy. SLP will continue to follow pt.  ?  ?HPI HPI: Pt is a 61 year old female with normal baseline of oriented x3 brought in by EMS for confusion and DKA.  EEG noted subclinical seizure. Pt started on Keppra and transferred to [facility] from [facility] for continuous 24-hour EEG.  MRI 2/22 with no acute findings. Dx acute metabolic encephalopathy. Pt found to have COVID-19. PMH: hypertension, diabetes mellitus, CVA with right-sided weakness, tobacco abuse, COPD and status post pancreatectomy and splenectomy. ?  ?   ?SLP Plan ? Continue with current plan of care ? ?  ?  ?Recommendations for follow up therapy are one component of a multi-disciplinary discharge planning process, led by the attending physician.  Recommendations may be updated based on patient status, additional functional criteria and insurance authorization. ?  ? ?Recommendations  ?   ?   ?    ?   ? ? ? ? Follow Up Recommendations: Skilled nursing-short term rehab (<3 hours/day) ?Assistance recommended at discharge: Frequent or constant Supervision/Assistance ?SLP Visit Diagnosis: Cognitive communication deficit (R41.841) ?Plan: Continue with current plan of care ? ? ? ? ?  ?  ?Brittany [name] I. Brittany [name], [place], CCC-SLP ?Acute Rehabilitation Services ?Office number [phone] ?Pager  [phone] ? ? ?Brittany [name] ? ?04/26/2021, 11:09 AM ? ? ? ?

## 2021-04-27 ENCOUNTER — Inpatient Hospital Stay (HOSPITAL_COMMUNITY): Payer: Medicare Other

## 2021-04-27 DIAGNOSIS — A419 Sepsis, unspecified organism: Secondary | ICD-10-CM | POA: Diagnosis not present

## 2021-04-27 LAB — URINALYSIS, ROUTINE W REFLEX MICROSCOPIC
Bilirubin Urine: NEGATIVE
Glucose, UA: NEGATIVE mg/dL
Hgb urine dipstick: NEGATIVE
Ketones, ur: NEGATIVE mg/dL
Nitrite: NEGATIVE
Protein, ur: 30 mg/dL — AB
Specific Gravity, Urine: 1.015 (ref 1.005–1.030)
WBC, UA: >50 WBC/hpf — ABNORMAL HIGH (ref 0–5)
pH: 7 (ref 5.0–8.0)

## 2021-04-27 LAB — COMPREHENSIVE METABOLIC PANEL
ALT: 31 U/L (ref 0–44)
AST: 26 U/L (ref 15–41)
Albumin: 2.5 g/dL — ABNORMAL LOW (ref 3.5–5.0)
Alkaline Phosphatase: 106 U/L (ref 38–126)
Anion gap: 11 (ref 5–15)
BUN: 36 mg/dL — ABNORMAL HIGH (ref 8–23)
CO2: 29 mmol/L (ref 22–32)
Calcium: 9.1 mg/dL (ref 8.9–10.3)
Chloride: 94 mmol/L — ABNORMAL LOW (ref 98–111)
Creatinine, Ser: 0.87 mg/dL (ref 0.44–1.00)
GFR, Estimated: >60 mL/min (ref >60)
Glucose, Bld: 128 mg/dL — ABNORMAL HIGH (ref 70–99)
Potassium: 5.1 mmol/L (ref 3.5–5.1)
Sodium: 134 mmol/L — ABNORMAL LOW (ref 135–145)
Total Bilirubin: 0.3 mg/dL (ref 0.3–1.2)
Total Protein: 8 g/dL (ref 6.5–8.1)

## 2021-04-27 LAB — CBC
HCT: 36.2 % (ref 36.0–46.0)
Hemoglobin: 11.4 g/dL — ABNORMAL LOW (ref 12.0–15.0)
MCH: 28.8 pg (ref 26.0–34.0)
MCHC: 31.5 g/dL (ref 30.0–36.0)
MCV: 91.4 fL (ref 80.0–100.0)
Platelets: 667 10*3/uL — ABNORMAL HIGH (ref 150–400)
RBC: 3.96 MIL/uL (ref 3.87–5.11)
RDW: 15.6 % — ABNORMAL HIGH (ref 11.5–15.5)
WBC: 15.7 10*3/uL — ABNORMAL HIGH (ref 4.0–10.5)
nRBC: 0 % (ref 0.0–0.2)

## 2021-04-27 LAB — GLUCOSE, CAPILLARY
Glucose-Capillary: 163 mg/dL — ABNORMAL HIGH (ref 70–99)
Glucose-Capillary: 75 mg/dL (ref 70–99)
Glucose-Capillary: 424 mg/dL — ABNORMAL HIGH (ref 70–99)
Glucose-Capillary: 430 mg/dL — ABNORMAL HIGH (ref 70–99)
Glucose-Capillary: 129 mg/dL — ABNORMAL HIGH (ref 70–99)

## 2021-04-27 LAB — LACTIC ACID, PLASMA
Lactic Acid, Venous: 1.5 mmol/L (ref 0.5–1.9)
Lactic Acid, Venous: 2.5 mmol/L (ref 0.5–1.9)
Lactic Acid, Venous: 4.3 mmol/L (ref 0.5–1.9)

## 2021-04-27 LAB — APTT: aPTT: 33 seconds (ref 24–36)

## 2021-04-27 LAB — MRSA NEXT GEN BY PCR, NASAL: MRSA by PCR Next Gen: NOT DETECTED

## 2021-04-27 LAB — VALPROIC ACID LEVEL: Valproic Acid Lvl: 65 ug/mL (ref 50.0–100.0)

## 2021-04-27 LAB — PROTIME-INR
INR: 1.1 (ref 0.8–1.2)
Prothrombin Time: 14.4 seconds (ref 11.4–15.2)

## 2021-04-27 LAB — PROCALCITONIN: Procalcitonin: 0.1 ng/mL

## 2021-04-27 MED ORDER — LACTATED RINGERS IV BOLUS (SEPSIS)
500.0000 mL | Freq: Once | INTRAVENOUS | Status: AC
  Administered 2021-04-27: 500 mL via INTRAVENOUS

## 2021-04-27 MED ORDER — LACTATED RINGERS IV BOLUS (SEPSIS)
1000.0000 mL | Freq: Once | INTRAVENOUS | Status: AC
  Administered 2021-04-27: 1000 mL via INTRAVENOUS

## 2021-04-27 MED ORDER — PIPERACILLIN-TAZOBACTAM 3.375 G IVPB
3.3750 g | Freq: Three times a day (TID) | INTRAVENOUS | Status: DC
  Administered 2021-04-27 – 2021-04-29 (×6): 3.375 g via INTRAVENOUS
  Filled 2021-04-27 (×6): qty 50

## 2021-04-27 MED ORDER — LACTATED RINGERS IV BOLUS
1000.0000 mL | Freq: Once | INTRAVENOUS | Status: AC
  Administered 2021-04-27: 1000 mL via INTRAVENOUS

## 2021-04-27 MED ORDER — VANCOMYCIN HCL 1250 MG/250ML IV SOLN
1250.0000 mg | INTRAVENOUS | Status: DC
Start: 2021-04-29 — End: 2021-04-29
  Filled 2021-04-27: qty 250

## 2021-04-27 MED ORDER — VANCOMYCIN HCL IN DEXTROSE 1-5 GM/200ML-% IV SOLN
1000.0000 mg | Freq: Once | INTRAVENOUS | Status: AC
  Administered 2021-04-27: 1000 mg via INTRAVENOUS
  Filled 2021-04-27: qty 200

## 2021-04-27 MED ORDER — LACTATED RINGERS IV SOLN: INTRAVENOUS | Status: DC

## 2021-04-27 NOTE — Progress Notes (Signed)
Physical Therapy Treatment ?Patient Details ?Name: Brittany [name] ?MRN: [id number] ?DOB: 09-Feb-1961 ?Today's Date: 04/27/2021 ? ? ?History of Present Illness Brittany [name] is a 34yoF who was brought to the ED for confusion plus DKA.  EEG noted subclinical seizure.  Also found to have incidental COVID. 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 Pt admitted with diverticulitis of intestine with abscess. ? ?  ?PT Comments  ? ? Pt resting in bed and pleasant upon PT arrival to room. Pt motivated to ambulate in the hallway to get ice cream, overall requiring min assist for stability and cuing during gait. D/c plan remains appropriate, will continue to follow.  ?  ?Recommendations for follow up therapy are one component of a multi-disciplinary discharge planning process, led by the attending physician.  Recommendations may be updated based on patient status, additional functional criteria and insurance authorization. ? ?Follow Up Recommendations ? Skilled nursing-short term rehab (<3 hours/day) ?  ?  ?Assistance Recommended at Discharge Frequent or constant Supervision/Assistance  ?Patient can return home with the following Assistance with cooking/housework;Direct supervision/assist for medications management;Help with stairs or ramp for entrance;Assist for transportation;A little help with walking and/or transfers;A little help with bathing/dressing/bathroom ?  ?Equipment Recommendations ? None recommended by PT  ?  ?Recommendations for Other Services   ? ? ?  ?Precautions / Restrictions Precautions ?Precautions: Fall ?Precaution Comments: seizure, urinary incontinence, colostomy bag frequently comes off ?Restrictions ?Weight Bearing Restrictions: No  ?  ? ?Mobility ? Bed Mobility ?Overal bed mobility: Needs Assistance ?Bed Mobility: Supine to Sit ?  ?  ?Supine to sit: Min assist ?  ?  ?General bed  mobility comments: assist for trunk elevation, scooting to EOB via HHA. ?  ? ?Transfers ?Overall transfer level: Needs assistance ?Equipment used: 1 person hand held assist ?Transfers: Sit to/from Stand ?Sit to Stand: Min assist ?  ?  ?  ?  ?  ?General transfer comment: light rise and steady assist ?  ? ?Ambulation/Gait ?Ambulation/Gait assistance: Min assist ?Gait Distance (Feet): 150 Feet ?Assistive device: 2 person hand held assist ?Gait Pattern/deviations: Step-through pattern, Decreased stride length, Narrow base of support, Drifts right/left, Scissoring ?Gait velocity: decr ?  ?  ?General Gait Details: assist to steady, guide pt trajectory. Cues for upright posture, widening BOS, avoiding scissoring gait ? ? ?Stairs ?  ?  ?  ?  ?  ? ? ?Wheelchair Mobility ?  ? ?Modified Rankin (Stroke Patients Only) ?  ? ? ?  ?Balance Overall balance assessment: Needs assistance ?Sitting-balance support: No upper extremity supported, Feet supported ?Sitting balance-Leahy Scale: Good ?  ?  ?Standing balance support: Bilateral upper extremity supported, During functional activity ?Standing balance-Leahy Scale: Poor ?  ?  ?  ?  ?  ?  ?  ?  ?  ?  ?  ?  ?  ? ?  ?Cognition Arousal/Alertness: Awake/alert ?Behavior During Therapy: [name] for tasks assessed/performed ?Overall Cognitive Status: No family/caregiver present to determine baseline cognitive functioning ?Area of Impairment: Following commands, Safety/judgement, Awareness, Problem solving ?  ?  ?  ?  ?  ?  ?  ?  ?Orientation Level: Disoriented to, Time, Situation ?Current Attention Level: Sustained ?Memory: Decreased recall of precautions, Decreased short-term memory ?Following Commands: Follows one step commands with increased time ?Safety/Judgement: Decreased awareness of safety, Decreased awareness of deficits ?Awareness: Intellectual ?Problem Solving: Slow processing,  Decreased initiation, Requires verbal cues ?General Comments: pt pleasant throughout, states she wants  "food" and motivated to progress to hallway mobility if able to go to fridge to get ice cream ?  ?  ? ?  ?Exercises   ? ?  ?General Comments   ?  ?  ? ?Pertinent Vitals/Pain Pain Assessment ?Pain Assessment: No/denies pain  ? ? ?Home Living   ?  ?  ?  ?  ?  ?  ?  ?  ?  ?   ?  ?Prior Function    ?  ?  ?   ? ?PT Goals (current goals can now be found in the care plan section) Acute Rehab PT Goals ?PT Goal Formulation: With patient ?Time For Goal Achievement: 05/09/21 ?Potential to Achieve Goals: Good ?Progress towards PT goals: Progressing toward goals ? ?  ?Frequency ? ? ? Min 2X/week ? ? ? ?  ?PT Plan Current plan remains appropriate  ? ? ?Co-evaluation   ?  ?  ?  ?  ? ?  ?AM-PAC PT "6 Clicks" Mobility   ?Outcome Measure ? Help needed turning from your back to your side while in a flat bed without using bedrails?: A Little ?Help needed moving from lying on your back to sitting on the side of a flat bed without using bedrails?: A Little ?Help needed moving to and from a bed to a chair (including a wheelchair)?: A Little ?Help needed standing up from a chair using your arms (e.g., wheelchair or bedside chair)?: A Little ?Help needed to walk in hospital room?: A Little ?Help needed climbing 3-5 steps with a railing? : A Lot ?6 Click Score: 17 ? ?  ?End of Session Equipment Utilized During Treatment: Gait belt ?Activity Tolerance: Patient tolerated treatment well ?Patient left: in chair;with call bell/phone within reach;with nursing/sitter in room (sitter present, waist restraint donned but not restrained per instruction of sitter) ?Nurse Communication: Mobility status ?PT Visit Diagnosis: Unsteadiness on feet (R26.81);Muscle weakness (generalized) (M62.81);Difficulty in walking, not elsewhere classified (R26.2);Ataxic gait (R26.0) ?  ? ? ?Time: 2706-2376 ?PT Time Calculation (min) (ACUTE ONLY): 15 min ? ?Charges:  $Gait Training: 8-22 mins          ?          ? ?Stacie Glaze, PT DPT ?Acute Rehabilitation Services ?Pager  [phone]  ?Office [phone] ? ? ? ?Brittany [name] ?04/27/2021, 1:31 PM ? ?

## 2021-04-27 NOTE — Sepsis Progress Note (Signed)
ELink tracking the Code Sepsis. 

## 2021-04-27 NOTE — Progress Notes (Addendum)
Sent Dr. [place] secure chat and made MD aware that patient is a red MEWS.MD acknowledged and placed  orders and activated code sepsis. Will follow orders.  ?

## 2021-04-27 NOTE — Progress Notes (Signed)
Sent Dr. [place] secure chat and made MD aware that patient's BP post boluses is 98/74 and heart rate around 109. MD acknowledged and stated he will order IVF.  ?

## 2021-04-27 NOTE — Progress Notes (Signed)
Speech Language Pathology Treatment: Cognitive-Linquistic  ?Patient Details ?Name: Brittany [name] ?MRN: [id number] ?DOB: [birth year] ?Today's Date: 04/27/2021 ?Time: 2355-7322 ?SLP Time Calculation (min) (ACUTE ONLY): 17 min ? ?Assessment / Plan / Recommendation ?Clinical Impression ? Pt was seen for cognitive-linguistic treatment. She was alert and cooperative during the session. Pt required multiple cues for attention and reasoning. She was partially oriented to time and required visual cues of a printed calendar to identify the date and day. Despite cues, pt insisted for a while that the year was 2036. Pt demonstrated 0% accuracy with problem solving related to safety increasing to 50% with verbal prompts. Pt was very focused on her wanting coffee during this session and on it needing to be a large-sized coffee cup and was not very easily redirected. SLP will continue to follow pt.  ?  ?HPI HPI: Pt is a 61 year old female with normal baseline of oriented x3 brought in by EMS for confusion and DKA.  EEG noted subclinical seizure. Pt started on Keppra and transferred to [facility] for continuous 24-hour EEG.  MRI 2/22 with no acute findings. Dx acute metabolic encephalopathy. Pt found to have COVID-19. PMH: hypertension, diabetes mellitus, CVA with right-sided weakness, tobacco abuse, COPD and status post pancreatectomy and splenectomy. ?  ?   ?SLP Plan ? Continue with current plan of care ? ?  ?  ?Recommendations for follow up therapy are one component of a multi-disciplinary discharge planning process, led by the attending physician.  Recommendations may be updated based on patient status, additional functional criteria and insurance authorization. ?  ? ?Recommendations  ?   ?   ?    ?   ? ? ? ? Oral Care Recommendations: Oral care BID ?Follow Up Recommendations: Skilled nursing-short term rehab (<3 hours/day) ?Assistance recommended at discharge: Frequent or constant  Supervision/Assistance ?SLP Visit Diagnosis: Cognitive communication deficit (R41.841) ?Plan: Continue with current plan of care ? ? ? ? ?  ?  ? ?Brittany [name], [place], CCC-SLP ?Acute Rehabilitation Services ?Office number [phone] ?Pager [phone] ? ?Brittany [name] ? ?04/27/2021, 4:11 PM ? ? ?

## 2021-04-27 NOTE — Progress Notes (Addendum)
Date and time results received: 04/27/21 1500 ?(use smartphrase ".now" to insert current time) ? ?Test: lactic acid ?Critical Value: 2.5 ? ?Name of Provider Notified: [place] ? ?Orders Received? Or Actions Taken?:  ?Call taken from Liliane Bade from lab, and RN notified ?

## 2021-04-27 NOTE — Progress Notes (Signed)
Sent Dr. [place] secure chat and made MD aware of lactic acid of 2.5, MD acknowledged and ordered IVF and bolus with repeat lactic acid.  ?

## 2021-04-27 NOTE — Progress Notes (Signed)
Pharmacy Antibiotic Note ? ?Brittany [name] is a 61 y.o. female admitted on 04/05/2021 with sepsis.  Pharmacy has been consulted for Vancomycin/Zosyn dosing. ? ?Spiking fevers, increased agitation ? ?Plan: ?Zosyn 3.375 grams iv Q 8 hours ?Vancomycin 1250 mg iv Q 48 hours - starting 04/29/21 ?Follow up cultures, progress, Scr ? ?Height: 5' (152.4 cm) ?Weight: 46.5 kg (102 lb 8.2 oz) ?IBW/kg (Calculated) : 45.5 ? ?Temp (24hrs), Avg:99.2 ?F (37.3 ?C), Min:98.1 ?F (36.7 ?C), Max:102.1 ?F (38.9 ?C) ? ?Recent Labs  ?Lab 04/22/21 ?0142 04/23/21 ?0030 04/24/21 ?0301 04/25/21 ?5409 04/26/21 ?0612 04/27/21 ?0404  ?WBC 8.6 7.0  --   --   --  15.7*  ?CREATININE 0.50 0.52 0.80 0.79 0.76 0.87  ?  ?Estimated Creatinine Clearance: 48.8 mL/min (by C-G formula based on SCr of 0.87 mg/dL).   ? ?Allergies  ?Allergen Reactions  ? Cephalosporins Itching  ?  TOLERATED ZOSYN (PIPERACILLIN) BEFORE  ? Hydromorphone Itching  ? Keflin [Cephalothin] Itching  ? Lactose Intolerance (Gi) Diarrhea  ? ? ?Thank you for allowing pharmacy to be a part of this patient?s care. ? ?Tad Moore ?04/27/2021 10:23 AM ? ?

## 2021-04-27 NOTE — Sepsis Progress Note (Signed)
Notified provider of need to order fluid bolus and a repeat lactate.  ?

## 2021-04-27 NOTE — Progress Notes (Signed)
Sent Dr. [place] secure chat and made MD aware of CBG of 430 and per SS and meal coverage ordered amount of insulin due is 11 units. Also made MD aware of repeat lactic acid of 4.3, MD acknowledged both and gave no new orders only agreeing to 11 units total of insulin to be given.  ?

## 2021-04-27 NOTE — Sepsis Progress Note (Signed)
Sepsis six hour bundle completed. BP stable. Repeat Lactate mildly elevated at 2.6. Dr. [place] ordered an additional liter of fluid for Total of 2500cc Fluid Resuscitation. A third lactate was collected at 16:03. ?

## 2021-04-27 NOTE — Sepsis Progress Note (Signed)
Notified bedside nurse of need to draw lactic acid and Blood cultures.  ?

## 2021-04-27 NOTE — Progress Notes (Signed)
?PROGRESS NOTE ? ? ? ?VYLA PINT  JKK:[id number] DOB: 05-May-1960 DOA: 04/05/2021 ?PCP: [facility]  ? ? ?Brief Narrative:  ?Brittany [name] is a 61 year old female with PMH significant for hypertension, diabetes mellitus, CVA with right-sided weakness, COPD, tobacco abuse, and status post pancreatectomy and splenectomy, anxiety/depression, discoid lupus erythematosus, hyperlipidemia who presented to [facility] ED on 2/22 via EMS with confusion.   ? ?Per her daughter at baseline, patient is oriented x 3. She has a history of stroke with right-sided weakness. Normally pt can use a cane to walk. This AM, pt was found to be on the floor with confusion. Pt has jerking and slurred speech per her daughter.  ?  ?Per EDP, EMS reported that the house was extremely filthy. The patient appeared to be in a soiled diaper. Pt has an colostomy with no collection bag in place. Daughter did not know which medications the patient was on.  EMS states they witnessed what could be a partial seizure, and gave the patient a total of 4 mg of intranasal Versed in route to the hospital. When I saw pt in ED. Pt was confused, not orientated x3. She has right-sided weakness which is likely from previous stroke.   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 Pt does not seem to have chest pain.  Not sure if patient has symptoms of UTI.  Her daughter is not sure if patient has a history of seizure.  Daughter states that she found a Keppra bottle which is about 62 years old, not sure if patient is taking his medications or not. ?  ?Patient was noted to have DKA, subclinical seizure on EEG and incidental finding of COVID-19 viral infection.  Neurology was consulted and patient was transferred to [facility] for continuous EEG.  Admitted to the hospital service.  Now DKA resolved.  Patient is off isolation of COVID-19.  Psychiatry consulted for agitation recommended Keppra should be changed to  Depakote. ?  ? ?  ?Assessment and Plan: ?* New onset seizure ([place]) ?Suspect due to lowered seizure threshold, precipitated by DKA/COVID. Neurology was consulted and followed during initial hospital course.  Discussed with psychiatry, given behavioral disturbances may be related to Keppra use, Keppra dose reduced in favor of Depakote. ?--Depakote level remains low, 23 on 3/13 ?--Increased Depakote to 500 mg BID on 3/13 ?--Continue Keppra 250 mg BID. ?--Outpatient follow-up with neurology in 2 to 3 months. ? ?Sepsis ([place]) ?Patient more lethargic this morning with elevated WBC count of 15.7.  Now with elevated temperature of 102.1 and tachycardia with heart rate of 128.  Unclear source. ?--Blood cultures x2: Ordered ?--Urinalysis, urine culture ordered ?--Chest x-ray ordered ?--Check lactic acid, procalcitonin, INR ?--MRSA PCR ?--LR bolus 30 mL/kg ?--Start empiric antibiotics with vancomycin and Zosyn (allergy to cephalosporin) ?--Tylenol PRN ?-- CBC daily; trend PCT and lactic acid ? ?Acute metabolic encephalopathy ?Improved since admission, but seems to be waxing and waning.   ?Likely complicated by hospital-acquired delirium and fluctuations in glucose due to noncompliance with diet.  ?--Continue PT/OT efforts while inpatient ?--Plan discharge to SNF for rehab and then may need to live with her sister as she is likely unable to manage her ADLs at home alone. ? ?DKA (diabetic ketoacidosis) ([name]) ?DKA resolved.  Hemoglobin A1c 15.0 consistent with noncompliance. ?Now transitioned to subcu insulin.   ?--Diabetic educator following, appreciate assistance ?--Insulin as below ? ?Type 2 diabetes mellitus with hyperlipidemia ([place]) ?HbA1c 15.0> poor control.  Suspect noncompliance. ?--Semglee 18 units daily. ?--NovoLog 2 units TID AC ?--Sliding scale for coverage ?--CBGs qAC/HS ? ? ?Essential hypertension ?Benazepril discontinued due to hyperkalemia. ?--BP stable off of antihypertensives ? ?Hypokalemia;  hyperkalemia ?Initially repleted during this hospitalization.  Potassium increased and was given Lokelma.  Benazepril discontinued. ?--K 5.1 on 3/16 ? ?Restless leg syndrome ?Continue ropinirole. ? ?Elevated troponin ?Etiology likely secondary to type II demand ischemia in the setting of DKA and seizure as above.  EKG with no concerning dynamic changes.  No concerning arrhythmias noted on telemetry, now discontinued.   ? ?History of stroke ?Continue statin. ?Not on aspirin/Plavix outpatient ? ?Hypomagnesemia ?Replaced.  Continue to monitor ? ?COVID-19 virus infection ?Incidental finding with+ PCR on 04/05/2021.  Completed 10 days of airborne/contact isolation, now discontinued on 3/5 ? ?Status post colostomy [facility]) ?Wound care consulted for continued colostomy recommendations while inpatient. ? ?COPD, mild ([place]) ?Stable.  Currently on room air ?--Continue albuterol MDI 1-2 puffs q4h PRN SOB/wheezing.  ? ?Protein-calorie malnutrition, severe ?Nutrition Status: ?Nutrition Problem: Severe Malnutrition ?Etiology: chronic illness (cancer, lupus) ?Signs/Symptoms: severe muscle depletion, severe fat depletion, percent weight loss ?Percent weight loss: 16.5 % (x5 months) ?Seen by nutrition.  Regular diet with thin liquids.  Continue to encourage increased supplementation. ? ?Pressure injury of skin ?Continue local wound care, offloading. ? ?Behavior related to cognitive impairment ?--Psychiatry following, appreciate assistance ?--Continue Depakote 500 mg BID (Depakote level 65 3/16) ?--Continue clonazepam 0.'25mg'$  BID ?--Continue melatonin '6mg'$  PO qHS ?--Started olanzapine 5 mg PO qHS (qTC 458 3/13) ?--Await further psychiatry recommendations regarding dose adjustments ? ? ?Hyperkalemia-resolved as of 04/20/2021 ?Benazepril discontinued.  Now potassium improved. ?Discontinued Lokelma ?--K 4.7 this am ?--Continue intermittent monitoring of BMP ? ? ? ?DVT prophylaxis:   Lovenox ?  Code Status: Full Code ?Family Communication: No  family present at bedside this morning ? ?Disposition Plan:  ?Level of care: Med-Surg ?Status is: Inpatient ?Remains inpatient appropriate because: Pending SNF placement ? ?  ? ?Consultants:  ?Neurology - signed off 2/27 ? ?Procedures:  ?EEG ? ?Antimicrobials:  ?None ? ? ?Subjective: ?Patient seen and examined at bedside, sleeping but arousable.  Appears more lethargic this morning with increase in WBC count noted.  Sitter present at bedside.  Currently not in restraints.  No reported agitation overnight or documented by staff; although received message this morning by nursing staff requesting renewal of restraints.  Sitter reports that patient apparently did not sleep well overnight.  Patient reports that she is hungry, feels tired. ? ?Later this morning, patient now with elevated temperature 102.1, tachycardia with heart rate 128.  Concern for sepsis of unknown source.  Initiating sepsis protocol with IV antibiotics. ? ? ?Objective: ?Vitals:  ? 04/27/21 0343 04/27/21 0500 04/27/21 0930 04/27/21 1018  ?BP: 110/68  102/82 111/84  ?Pulse: 72  (!) 128 (!) 123  ?Resp: '20  18 16  '$ ?Temp: 98.1 ?F (36.7 ?C)  (!) 102.1 ?F (38.9 ?C) (!) 101.1 ?F (38.4 ?C)  ?TempSrc: Oral  Oral Oral  ?SpO2: 100%  100% 94%  ?Weight:  46.5 kg    ?Height:      ? ? ?Intake/Output Summary (Last 24 hours) at 04/27/2021 1037 ?Last data filed at 04/26/2021 1300 ?Gross per 24 hour  ?Intake 360 ml  ?Output --  ?Net 360 ml  ? ? ?Filed Weights  ? 04/22/21 0500 04/24/21 0409 04/27/21 0500  ?Weight: 44.9 kg 45.9 kg 46.5 kg  ? ? ?Examination: ? ?Physical Exam: ?GEN: NAD, confused, lethargic, sleeping but  arousable, chronically ill/cachectic in appearance, appears older than stated age ?HEENT: NCAT, PERRL, EOMI, sclera clear, MMM ?PULM: CTAB w/o wheezes/crackles, normal respiratory effort, on room air ?CV: RRR w/o M/G/R ?GI: abd soft, NTND, NABS, no R/G/M, colostomy bag noted with soft brown stool in collection bag ?MSK: no peripheral edema, muscle strength  globally intact 5/5 bilateral upper/lower extremities ?NEURO: CN II-XII intact, no focal deficits, sensation to light touch intact ?Integumentary: Stage II wounds noted left buttock likely from excoriation ? ? ? ?Data Reviewed: I have

## 2021-04-27 NOTE — Progress Notes (Addendum)
?   04/27/21 0930  ?Assess: MEWS Score  ?Temp (!) 102.1 ?F (38.9 ?C)  ?BP 102/82  ?Pulse Rate (!) 128  ?Resp 18  ?Level of Consciousness Alert  ?SpO2 100 %  ?O2 Device Room Air  ?Assess: MEWS Score  ?MEWS Temp 2  ?MEWS Systolic 0  ?MEWS Pulse 2  ?MEWS RR 0  ?MEWS LOC 0  ?MEWS Score 4  ?MEWS Score Color Red  ?Assess: if the MEWS score is Yellow or Red  ?Were vital signs taken at a resting state? Yes  ?Focused Assessment No change from prior assessment  ?Early Detection of Sepsis Score *See Row Information* Medium  ?MEWS guidelines implemented *See Row Information* Yes  ?Treat  ?Pain Scale 0-10  ?Pain Score 0  ?Take Vital Signs  ?Increase Vital Sign Frequency  Red: Q 1hr X 4 then Q 4hr X 4, if remains red, continue Q 4hrs  ?Escalate  ?MEWS: Escalate Red: discuss with charge nurse/RN and provider, consider discussing with RRT  ?Notify: Charge Nurse/RN  ?Name of Charge Nurse/RN Notified Jesse Fall, RN  ?Date Charge Nurse/RN Notified 04/27/21  ?Time Charge Nurse/RN Notified 0930  ? ? ?

## 2021-04-27 NOTE — Consult Note (Signed)
[facility] Followup Face-to-Face Psychiatric Evaluation ? ? ?Service Date: April 27, 2021 ?LOS:  LOS: 22 days  ? ? ?Assessment  ?Brittany [name] is a 61 y.o. female admitted medically for 04/05/2021  9:28 PM for DKA. She carries the psychiatric diagnoses of  and has a past medical history of MDD and anxiety.Psychiatry was consulted for agitation and medication mgmt by Eric [place] MD.  ?  ?  ?Her current presentation of waxing and waning agitation is most consistent with delirium. Patient will require a reassessment in the AM. Currently her Outpatient psychotropic medication are unknown but may include Remeron '15mg'$  QHS and gabapentin '300mg'$  TID. It is unknown if patient is compliant. On initial examination, patient patient is pleasant disoriented but redirectable and attempting participate in assessment. Please see plan below for detailed recommendations.  ? ? ?Patient's worsened agitation the past few days and delirious presentation may have been 2/2 to infection that was noted by fever and leukocytosis this AM. Patient appears improved this PM. Patient is still having a difficult time with hospitalization but is redirectable and responds well to verbal redirection. Patient's Depakote is therapeutic and patient is no longer endorsing irritability.  ? ? ?Diagnoses:  ?Active Hospital problems: ?Principal Problem: ?  New onset seizure ([place]) ?Active Problems: ?  COPD, mild ([place]) ?  Type 2 diabetes mellitus with hyperlipidemia ([place]) ?  Essential hypertension ?  Restless leg syndrome ?  DKA (diabetic ketoacidosis) ([place]) ?  Hypokalemia; hyperkalemia ?  Acute metabolic encephalopathy ?  Protein-calorie malnutrition, severe ?  Elevated troponin ?  History of stroke ?  Hypomagnesemia ?  COVID-19 virus infection ?  Status post colostomy [facility]) ?  Pressure injury of skin ?  Behavior related to cognitive impairment ?  Sepsis ([place]) ?  ? ? ?Plan  ?## Safety and Observation Level:  ?- Based on my clinical evaluation,  I estimate the patient to be at low risk of self harm in the current setting ?- At this time, we recommend a routine level of observation. This decision is based on my review of the chart including patient's history and current presentation, interview of the patient, mental status examination, and consideration of suicide risk including evaluating suicidal ideation, plan, intent, suicidal or self-harm behaviors, risk factors, and protective factors. This judgment is based on our ability to directly address suicide risk, implement suicide prevention strategies and develop a safety plan while the patient is in the clinical setting. Please contact our team if there is a concern that risk level has changed. ?  ?  ?## Medications:  ?-- Continue Remeron 7.'5mg'$  QHS ?-- Continue Depakote '500mg'$  BID ?-- Depakote lvl 3/16 - 65 CMP lvl 3/16- no transamnititis ?-- Melatonin '6mg'$  ?-- Continue Zyprexa '5mg'$  QHS ?  ?## Medical Decision Making Capacity:  ?-- Not formally assessed ?  ?## Further Work-up:  ?-- Recommend UA ?  ?  ?--Most recent EKG 3/13 with QTC of 458 and HR 113 ?-- Pertinent labwork reviewed earlier this admission includes: -- CBC- WBC 14.9/Hgb 10.2/ PLT 584, CMP- K+ >7.5>>> 4.6, Mg- 1.6, BGL 288 ?  ?## Disposition:  ?-- Per primary ?  ?## Behavioral / Environmental:  ?-- Verbal redirection before Agitation Protocol ?  ?##Legal Status ? -- Voluntary ?  ?Thank you for this consult request. Recommendations have been communicated to the primary team.  We will continue to follow at this time.  ?  ?PGY-2 ? ?Freida Busman, MD ? ? ? followup history  ?  Relevant Aspects of Hospital Course:  ?Admitted on 04/05/2021 for DKA. ? ?Patient Report:  ?On initial attempt this AM, patient sleeping and nods yes that she would like team to come back later.  ? ?On second attempt patient was up to chair with sitter in the room. Sitter endorsed that patient had been up, cleaned, and for a walk earlier in the day. Patient was sitting eating  popcorn. Patient was able to correctly remember that her sister had brought the popcorn for her. Patient reported that her daughter called today and that this was pleasing for her. Patient reports that she spoke with her daughter about going home, and recalls her daughter saying that she is worried about patient.Patient reported that she had not slept well. Patient reports that her mood is "ok" however affect appears dysphoric. Provider asked patient what appeared to be troubling her and patient initially answered, " it doesn't matter." Patient then suddenly became tearful and buried her face in her hands and expressed that she just wanted to go home. Patient began apologizing about her tears.  ? ?Sitter interjected that she had just ordered that patient's favorite food (fish). Patient was able to stop crying when her food arrived and appeared interested in eating. Patient was adamant that she was not having SI, HI and AH for the first time denied VH. Patient endorsed she had not been seeing "the people."  ? ? ?Had seen a psychiatrist in [place]. Was given some medication to "calm her down" - clarifies this was because of anger not anxiety. States she has not slept in some time (does have a hx of 1-2 nights without sleep in her 20s). ?  ?Psychiatric History:  ?Information collected from EMR and patient. ?Previous Meds for depression: Celexa '10mg'$  (failed) Zoloft '25mg'$ , Wellbutrin XL '300mg'$ , Effexor XR '150mg'$  (failed), Prozac '20mg'$ Trazodone '25mg'$  QHS PRN ?Prior OP Psych: Dr. Valeda Malm in [place] in 2016-2018 ?  ?  ?Family psych history:  ?Sister: Anxiety disorder ?  ?  ?Social History:  ?Smokes 1/1 ppd. No EtOH. No marijuana - some years and years ago. No cociane, heroin, meth.  ? ?Family History:  ?The patient's family history includes Anxiety disorder in her sister; Breast cancer in her maternal aunt. ? ?Medical History: ?Past Medical History:  ?Diagnosis Date  ? Cancer [facility])   ? Collagen vascular disease ([place])   ?  Diabetes mellitus without complication ([place])   ? Hypertension   ? Lupus ([place])   ? 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 ? ?Surgical History: ?Past Surgical History:  ?Procedure Laterality Date  ? COLECTOMY WITH COLOSTOMY CREATION/HARTMANN PROCEDURE N/A 03/10/2021  ? Procedure: COLECTOMY WITH COLOSTOMY CREATION/HARTMANN PROCEDURE;  Surgeon: Jules Husbands, MD;  Location: [facility] ORS;  Service: General;  Laterality: N/A;  ? PANCREATECTOMY    ? spleenectomy    ? ? ?Medications:  ? ?Current Facility-Administered Medications:  ?  acetaminophen (TYLENOL) tablet 650 mg, 650 mg, Oral, Q6H PRN, 650 mg at 04/27/21 1005 **OR** acetaminophen (TYLENOL) suppository 650 mg, 650 mg, Rectal, Q6H PRN, Annita Brod, MD ?  albuterol (VENTOLIN HFA) 108 (90 Base) MCG/ACT inhaler 1-2 puff, 1-2 puff, Inhalation, Q4H PRN, Annita Brod, MD ?  atorvastatin (LIPITOR) tablet 40 mg, 40 mg, Oral, QHS, [place] Donnamarie Poag, DO, 40 mg at 04/26/21 2123 ?  camphor-menthol (SARNA) lotion, , Topical, PRN, Annita Brod, MD, Given at 04/27/21  1120 ?  clonazePAM (KLONOPIN) disintegrating tablet 0.25 mg, 0.25 mg, Oral, BID, Matthias Bogus B, MD, 0.25 mg at 04/27/21 1005 ?  dextrose 50 % solution 0-50 mL, 0-50 mL, Intravenous, PRN, Annita Brod, MD, 25 mL at 04/19/21 1833 ?  divalproex (DEPAKOTE) DR tablet 500 mg, 500 mg, Oral, Q12H, [place] Donnamarie Poag, DO, 500 mg at 04/27/21 1007 ?  enoxaparin (LOVENOX) 100 mg/mL injection 20 mg, 20 mg, Subcutaneous, Q24H, Annita Brod, MD, 20 mg at 04/27/21 1013 ?  feeding supplement (GLUCERNA SHAKE) (GLUCERNA SHAKE) liquid 237 mL, 237 mL, Oral, TID BM, Annita Brod, MD, 237 mL at 04/27/21 1445 ?  Gerhardt's butt cream, , Topical, QID, [place] Donnamarie Poag, DO, Given at 04/27/21 1519 ?  haloperidol lactate (HALDOL) injection 5 mg, 5 mg, Intravenous, Q6H PRN, [place] Donnamarie Poag, DO, 5 mg at 04/25/21 1027 ?   hydrOXYzine (ATARAX) tablet 50 mg, 50 mg, Oral, Q6H PRN, [place] Eric J, DO, 50 mg at 04/25/21 2482 ?  insulin aspart (novoLOG) injection 0-5 Units, 0-5 Units, Subcutaneous, QHS, [place] Eric J, DO, 4 Units at

## 2021-04-27 NOTE — Assessment & Plan Note (Addendum)
Patient more lethargic on 3/16; with elevated WBC count of 15.7, temperature of 102.1 and heart rate of 128.  Chest x-ray with new 1.5 cm nodular density right hilum with heterogeneous opacities right middle and lower lobes, consistent with atelectasis versus infiltrate.  Urinalysis with large leukocytes, negative nitrite, rare bacteria, greater than 50 WBCs.  Etiology likely secondary to UTI versus pneumonia.  Patient received 30 mL/kg IV fluid resuscitation.  CT chest/abdomen/pelvis remarkable for right lower lobe pulmonary consolidation consistent with pneumonia, nonspecific patchy GGO right upper/left lower lobe.  ?--WBC 7.0 (3/12)>15.7 (3/16)>26.6>14.8>20.0>17.6>10.4 ?--Lactic acid 1.5>2.5>4.3>1.6>1.5 ?--PCT 0.10>0.27>0.21 ?--Blood cultures x2: No growth x 5 days ?--Urinalysis: + yeast ?--MRSA PCR negative; DC'd Vancomycin 3/18 ?--Zosyn (allergy to cephalosporin), plan 7 day course ?--Fluconazole 200 mg p.o. daily x14 days ?--Tylenol PRN ?--CBC daily ?

## 2021-04-28 ENCOUNTER — Inpatient Hospital Stay (HOSPITAL_COMMUNITY): Payer: Medicare Other

## 2021-04-28 LAB — COMPREHENSIVE METABOLIC PANEL
ALT: 22 U/L (ref 0–44)
AST: 22 U/L (ref 15–41)
Albumin: 2.1 g/dL — ABNORMAL LOW (ref 3.5–5.0)
Alkaline Phosphatase: 90 U/L (ref 38–126)
Anion gap: 9 (ref 5–15)
BUN: 21 mg/dL (ref 8–23)
CO2: 28 mmol/L (ref 22–32)
Calcium: 8.8 mg/dL — ABNORMAL LOW (ref 8.9–10.3)
Chloride: 101 mmol/L (ref 98–111)
Creatinine, Ser: 0.79 mg/dL (ref 0.44–1.00)
GFR, Estimated: >60 mL/min (ref >60)
Glucose, Bld: 101 mg/dL — ABNORMAL HIGH (ref 70–99)
Potassium: 4.5 mmol/L (ref 3.5–5.1)
Sodium: 138 mmol/L (ref 135–145)
Total Bilirubin: 0.3 mg/dL (ref 0.3–1.2)
Total Protein: 7.1 g/dL (ref 6.5–8.1)

## 2021-04-28 LAB — URINE CULTURE: Culture: 10000 — AB

## 2021-04-28 LAB — CBC
HCT: 30.6 % — ABNORMAL LOW (ref 36.0–46.0)
Hemoglobin: 9.7 g/dL — ABNORMAL LOW (ref 12.0–15.0)
MCH: 29.3 pg (ref 26.0–34.0)
MCHC: 31.7 g/dL (ref 30.0–36.0)
MCV: 92.4 fL (ref 80.0–100.0)
Platelets: 534 10*3/uL — ABNORMAL HIGH (ref 150–400)
RBC: 3.31 MIL/uL — ABNORMAL LOW (ref 3.87–5.11)
RDW: 15.7 % — ABNORMAL HIGH (ref 11.5–15.5)
WBC: 26.6 10*3/uL — ABNORMAL HIGH (ref 4.0–10.5)
nRBC: 0 % (ref 0.0–0.2)

## 2021-04-28 LAB — GLUCOSE, CAPILLARY
Glucose-Capillary: 109 mg/dL — ABNORMAL HIGH (ref 70–99)
Glucose-Capillary: 111 mg/dL — ABNORMAL HIGH (ref 70–99)
Glucose-Capillary: 114 mg/dL — ABNORMAL HIGH (ref 70–99)
Glucose-Capillary: 269 mg/dL — ABNORMAL HIGH (ref 70–99)
Glucose-Capillary: 347 mg/dL — ABNORMAL HIGH (ref 70–99)

## 2021-04-28 LAB — PROCALCITONIN: Procalcitonin: 0.27 ng/mL

## 2021-04-28 LAB — LACTIC ACID, PLASMA: Lactic Acid, Venous: 1.6 mmol/L (ref 0.5–1.9)

## 2021-04-28 MED ORDER — IOHEXOL 9 MG/ML PO SOLN
ORAL | Status: AC
  Filled 2021-04-28: qty 1000

## 2021-04-28 MED ORDER — IBUPROFEN 200 MG PO TABS
400.0000 mg | ORAL_TABLET | Freq: Once | ORAL | Status: AC | PRN
  Administered 2021-04-28: 400 mg via ORAL
  Filled 2021-04-28: qty 2

## 2021-04-28 MED ORDER — GABAPENTIN 100 MG PO CAPS
200.0000 mg | ORAL_CAPSULE | Freq: Three times a day (TID) | ORAL | Status: DC
  Administered 2021-04-28 – 2021-05-09 (×31): 200 mg via ORAL
  Filled 2021-04-28 (×31): qty 2

## 2021-04-28 MED ORDER — IOHEXOL 300 MG/ML  SOLN
100.0000 mL | Freq: Once | INTRAMUSCULAR | Status: AC | PRN
  Administered 2021-04-28: 100 mL via INTRAVENOUS

## 2021-04-28 MED ORDER — HALOPERIDOL LACTATE 5 MG/ML IJ SOLN
2.0000 mg | Freq: Four times a day (QID) | INTRAMUSCULAR | Status: DC | PRN
  Administered 2021-04-28 – 2021-05-02 (×6): 2 mg via INTRAVENOUS
  Filled 2021-04-28 (×6): qty 1

## 2021-04-28 MED ORDER — FLUCONAZOLE 200 MG PO TABS
200.0000 mg | ORAL_TABLET | Freq: Every day | ORAL | Status: DC
Start: 2021-04-28 — End: 2021-05-05
  Administered 2021-04-28 – 2021-05-04 (×6): 200 mg via ORAL
  Filled 2021-04-28 (×8): qty 1

## 2021-04-28 NOTE — Progress Notes (Signed)
Inpatient Diabetes Program Recommendations ? ?AACE/ADA: New Consensus Statement on Inpatient Glycemic Control (2015) ? ?Target Ranges:  Prepandial:   less than 140 mg/dL ?     Peak postprandial:   less than 180 mg/dL (1-2 hours) ?     Critically ill patients:  140 - 180 mg/dL  ? ?Lab Results  ?Component Value Date  ? GLUCAP 111 (H) 04/28/2021  ? HGBA1C 15.0 (H) 04/06/2021  ? ? ?Review of Glycemic Control ? Latest Reference Range & Units 04/27/21 16:32 04/27/21 23:06 04/28/21 06:09 04/28/21 07:22  ?Glucose-Capillary 70 - 99 mg/dL 430 (H) 129 (H) 109 (H) 111 (H)  ?(H): Data is abnormally high ?Diabetes history: Type 2 Dm ?Outpatient Diabetes medications: Novolog 70/30 30 units QAM, ?Current orders for Inpatient glycemic control: Novolog 0-9 units TID & HS, Semglee 18 units QD, Novolog 2 units TID ?  ? ?Inpatient Diabetes Program Recommendations:   ? ?Hyperglycemia yesterday of 400's mg/dL. Noted that patient did not received meal coverage as documented in [facility], however meal and ice cream were notated. Assuming patient did consume >50 % of meals thus explaining increase. Will continue with current orders.  ? ?Thanks, ?Bronson Curb, MSN, RNC-OB ?Diabetes Coordinator ?[phone] (8a-5p) ? ? ? ? ?
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
## 2021-04-28 NOTE — Progress Notes (Signed)
?   04/28/21 0000  ?Notify: Provider  ?Provider Name/Title Dr Marlowe Sax  ?Date Provider Notified 04/28/21  ?Time Provider Notified 0025  ?Notification Type Page  ?Notification Reason Other (Comment) ?(pt had a temp of 103)  ?Provider response No new orders  ?Date of Provider Response (S)  04/28/21  ?Time of Provider Response [phone]  ? ? ?

## 2021-04-28 NOTE — Progress Notes (Addendum)
?PROGRESS NOTE ? ? ? ?Brittany [name]  WER:[id number] DOB: 10-Oct-1960 DOA: 04/05/2021 ?PCP: [facility]  ? ? ?Brief Narrative:  ?Brittany [name] is a 61 year old female with PMH significant for hypertension, diabetes mellitus, CVA with right-sided weakness, COPD, tobacco abuse, and status post pancreatectomy and splenectomy, anxiety/depression, discoid lupus erythematosus, hyperlipidemia who presented to [facility] ED on 2/22 via EMS with confusion.   ? ?Per her daughter at baseline, patient is oriented x 3. She has a history of stroke with right-sided weakness. Normally pt can use a cane to walk. This AM, pt was found to be on the floor with confusion. Pt has jerking and slurred speech per her daughter.  ?  ?Per EDP, EMS reported that the house was extremely filthy. The patient appeared to be in a soiled diaper. Pt has an colostomy with no collection bag in place. Daughter did not know which medications the patient was on.  EMS states they witnessed what could be a partial seizure, and gave the patient a total of 4 mg of intranasal Versed in route to the hospital. When I saw pt in ED. Pt was confused, not orientated x3. She has right-sided weakness which is likely from previous stroke.   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 Pt does not seem to have chest pain.  Not sure if patient has symptoms of UTI.  Her daughter is not sure if patient has a history of seizure.  Daughter states that she found a Keppra bottle which is about 62 years old, not sure if patient is taking his medications or not. ?  ?Patient was noted to have DKA, subclinical seizure on EEG and incidental finding of COVID-19 viral infection.  Neurology was consulted and patient was transferred to [facility] for continuous EEG.  Admitted to the hospital service.  Now DKA resolved.  Patient is off isolation of COVID-19.  Psychiatry consulted for agitation recommended Keppra should be changed to  Depakote. ?  ? ?  ?Assessment and Plan: ?* New onset seizure ([place]) ?Suspect due to lowered seizure threshold, precipitated by DKA/COVID. Neurology was consulted and followed during initial hospital course.  Discussed with psychiatry, given behavioral disturbances may be related to [place] use, Keppra now dc'd. reduced in favor of Depakote. ?--Depakote level remains low, 65 on 3/16 ?--Depakote to 500 mg BID ?--Outpatient follow-up with neurology in 2 to 3 months. ? ?Sepsis ([place]) ?Patient more lethargic on 3/16; with elevated WBC count of 15.7, temperature of 102.1 and heart rate of 128.  Chest x-ray with new 1.5 cm nodular density right hilum with heterogeneous opacities right middle and lower lobes, consistent with atelectasis versus infiltrate.  Urinalysis with large leukocytes, negative nitrite, rare bacteria, greater than 50 WBCs.  Etiology likely secondary to UTI versus pneumonia.  Patient received 30 mL/kg IV fluid resuscitation. ?--WBC 7.0 (3/12)>15.7 (3/16)>26.6 ?--Lactic acid 1.5>2.5>4.3>1.6 ?--PCT 0.10>0.27 ?--Blood cultures x2: No growth less than 24 hours ?--Urinalysis: Pending ?--CT Chest/abdomen/pelvis for further evaluation ?--Vancomycin ?--Zosyn (allergy to cephalosporin) ?--Tylenol PRN ?--CBC daily; trend PCT and lactic acid ? ?Acute metabolic encephalopathy ?Improved since admission, but seems to be waxing and waning.   ?Likely complicated by hospital-acquired delirium and fluctuations in glucose due to noncompliance with diet.  ?--Continue PT/OT efforts while inpatient ?--Plan discharge to SNF for rehab when medically ready ? ?DKA (diabetic ketoacidosis) ([place]) ?DKA resolved.  Hemoglobin A1c 15.0 consistent with noncompliance. ?Now transitioned to subcutaneous insulin.   ?--Diabetic educator following, appreciate  assistance ?--Insulin as below ? ?Type 2 diabetes mellitus with hyperlipidemia ([place]) ?HbA1c 15.0> poor control. Suspect noncompliance. ?--Semglee 18 units daily. ?--NovoLog 2 units TID  AC ?--Sliding scale for coverage ?--CBGs qAC/HS ? ? ?Essential hypertension ?Benazepril discontinued due to hyperkalemia. ?--BP stable off of antihypertensives ? ?Hypokalemia; hyperkalemia ?Initially repleted during this hospitalization.  Potassium increased and was given Lokelma.  Benazepril discontinued. ?--K 4.7 on 3/17 ? ?Restless leg syndrome ?Continue ropinirole. ? ?Elevated troponin ?Etiology likely secondary to type II demand ischemia in the setting of DKA and seizure as above.  EKG with no concerning dynamic changes.  No concerning arrhythmias noted on telemetry. ? ?History of stroke ?Continue statin. ?Not on aspirin/Plavix outpatient ? ?Hypomagnesemia ?Replaced.  Continue to monitor ? ?COVID-19 virus infection ?Incidental finding with+ PCR on 04/05/2021.  Completed 10 days of airborne/contact isolation, now discontinued on 3/5 ? ?Status post colostomy [facility]) ?Wound care consulted for continued colostomy recommendations while inpatient. ? ?COPD, mild ([place]) ?Stable.  Currently on room air ?--Continue albuterol MDI 1-2 puffs q4h PRN SOB/wheezing.  ? ?Protein-calorie malnutrition, severe ?Nutrition Status: ?Nutrition Problem: Severe Malnutrition ?Etiology: chronic illness (cancer, lupus) ?Signs/Symptoms: severe muscle depletion, severe fat depletion, percent weight loss ?Percent weight loss: 16.5 % (x5 months) ?Seen by nutrition.  Regular diet with thin liquids.  Continue to encourage increased supplementation. ? ?Pressure injury of skin ?Continue local wound care, offloading. ? ?Behavior related to cognitive impairment ?Patient initially presenting to the ED with confusion and was found to be in DKA with subclinical seizure which have been adequately treated.  Patient was also found and noted by EMS to be residing in a home that is uninhabitable.  Even with correction of multiple metabolic issues; patient remains unable to understand and adequately reason regarding her chronic comorbidities and treatment plan  likely due to underlying cognitive deficits.  Given this, she is unable to manage her activities of daily living, medication administration and live alone as she unable to take care of herself independently.  Patient would benefit from having guardianship placed. ?--Psychiatry following, appreciate assistance ?--Continue Depakote 500 mg BID (Depakote level 65 3/16) ?--Continue clonazepam 0.'25mg'$  BID ?--Continue melatonin '6mg'$  PO qHS ?--Started olanzapine 5 mg PO qHS (qTC 458 3/13) ?--Await further psychiatry recommendations regarding dose adjustments ? ? ?Hyperkalemia-resolved as of 04/20/2021 ?Benazepril discontinued.  Now potassium improved. ?Discontinued Lokelma ?--K 4.7 this am ?--Continue intermittent monitoring of BMP ? ? ? ?DVT prophylaxis:   Lovenox ?  Code Status: Full Code ?Family Communication: No family present at bedside this morning ? ?Disposition Plan:  ?Level of care: Med-Surg ?Status is: Inpatient ?Remains inpatient appropriate because: Continues on IV antibiotics, pending CT chest/abdomen/pelvis, continues to require restraints, pending SNF placement once medically stable ? ?  ? ?Consultants:  ?Neurology - signed off 2/27 ? ?Procedures:  ?EEG ? ?Antimicrobials:  ?None ? ? ?Subjective: ?Patient seen and examined at bedside, sitting at edge of bed drinking coffee.  Sitter present.  Reports did not sleep last night, verified by sitter this morning.  Already have eaten breakfast this morning.  Complaining of some mild left knee pain.  No other specific complaints or concerns at this time other than wanting to know when she can go home.  Continues on IV antibiotics, pending CT chest/abdomen/pelvis for further evaluation of sepsis.  Denies headache, no chest pain, no shortness of breath, no abdominal pain, no cough/congestion, no chills/night sweats, no nausea/vomiting/diarrhea, no fatigue, no paresthesias.  No acute events reported overnight by nursing staff. ? ?Objective: ?Vitals:  ?  04/28/21 0309 04/28/21  0406 04/28/21 0723 04/28/21 1206  ?BP: 120/78 110/77 (!) 138/92 (!) 132/94  ?Pulse: 99 89 83 95  ?Resp: '18 18 17   '$ ?Temp: 97.9 ?F (36.6 ?C) 97.9 ?F (36.6 ?C) 98 ?F (36.7 ?C) 98.1 ?F (36.7 ?C)  ?TempSrc: Axillary O

## 2021-04-29 LAB — CBC
HCT: 27.6 % — ABNORMAL LOW (ref 36.0–46.0)
Hemoglobin: 8.6 g/dL — ABNORMAL LOW (ref 12.0–15.0)
MCH: 29 pg (ref 26.0–34.0)
MCHC: 31.2 g/dL (ref 30.0–36.0)
MCV: 92.9 fL (ref 80.0–100.0)
Platelets: 517 10*3/uL — ABNORMAL HIGH (ref 150–400)
RBC: 2.97 MIL/uL — ABNORMAL LOW (ref 3.87–5.11)
RDW: 15.9 % — ABNORMAL HIGH (ref 11.5–15.5)
WBC: 14.8 10*3/uL — ABNORMAL HIGH (ref 4.0–10.5)
nRBC: 0 % (ref 0.0–0.2)

## 2021-04-29 LAB — COMPREHENSIVE METABOLIC PANEL
ALT: 19 U/L (ref 0–44)
AST: 21 U/L (ref 15–41)
Albumin: 1.9 g/dL — ABNORMAL LOW (ref 3.5–5.0)
Alkaline Phosphatase: 100 U/L (ref 38–126)
Anion gap: 7 (ref 5–15)
BUN: 12 mg/dL (ref 8–23)
CO2: 29 mmol/L (ref 22–32)
Calcium: 8.3 mg/dL — ABNORMAL LOW (ref 8.9–10.3)
Chloride: 105 mmol/L (ref 98–111)
Creatinine, Ser: 0.71 mg/dL (ref 0.44–1.00)
GFR, Estimated: >60 mL/min (ref >60)
Glucose, Bld: 193 mg/dL — ABNORMAL HIGH (ref 70–99)
Potassium: 3.8 mmol/L (ref 3.5–5.1)
Sodium: 141 mmol/L (ref 135–145)
Total Bilirubin: 0.2 mg/dL — ABNORMAL LOW (ref 0.3–1.2)
Total Protein: 6.6 g/dL (ref 6.5–8.1)

## 2021-04-29 LAB — LACTIC ACID, PLASMA: Lactic Acid, Venous: 1.5 mmol/L (ref 0.5–1.9)

## 2021-04-29 LAB — GLUCOSE, CAPILLARY
Glucose-Capillary: 197 mg/dL — ABNORMAL HIGH (ref 70–99)
Glucose-Capillary: 112 mg/dL — ABNORMAL HIGH (ref 70–99)
Glucose-Capillary: 128 mg/dL — ABNORMAL HIGH (ref 70–99)
Glucose-Capillary: 67 mg/dL — ABNORMAL LOW (ref 70–99)
Glucose-Capillary: 81 mg/dL (ref 70–99)
Glucose-Capillary: 229 mg/dL — ABNORMAL HIGH (ref 70–99)

## 2021-04-29 LAB — PROCALCITONIN: Procalcitonin: 0.21 ng/mL

## 2021-04-29 MED ORDER — PIPERACILLIN-TAZOBACTAM 3.375 G IVPB
3.3750 g | Freq: Three times a day (TID) | INTRAVENOUS | Status: AC
  Administered 2021-04-29 – 2021-05-04 (×15): 3.375 g via INTRAVENOUS
  Filled 2021-04-29 (×15): qty 50

## 2021-04-29 NOTE — Progress Notes (Signed)
?PROGRESS NOTE ? ? ? ?Brittany [name]  RDE:[id number] DOB: May 24, 1960 DOA: 04/05/2021 ?PCP: [facility]  ? ? ?Brief Narrative:  ?Brittany [name] is a 61 year old female with PMH significant for hypertension, diabetes mellitus, CVA with right-sided weakness, COPD, tobacco abuse, and status post pancreatectomy and splenectomy, anxiety/depression, discoid lupus erythematosus, hyperlipidemia who presented to [facility] ED on 2/22 via EMS with confusion.   ? ?Per her daughter at baseline, patient is oriented x 3. She has a history of stroke with right-sided weakness. Normally pt can use a cane to walk. This AM, pt was found to be on the floor with confusion. Pt has jerking and slurred speech per her daughter.  ?  ?Per EDP, EMS reported that the house was extremely filthy. The patient appeared to be in a soiled diaper. Pt has an colostomy with no collection bag in place. Daughter did not know which medications the patient was on.  EMS states they witnessed what could be a partial seizure, and gave the patient a total of 4 mg of intranasal Versed in route to the hospital. When I saw pt in ED. Pt was confused, not orientated x3. She has right-sided weakness which is likely from previous stroke.   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 Pt does not seem to have chest pain.  Not sure if patient has symptoms of UTI.  Her daughter is not sure if patient has a history of seizure.  Daughter states that she found a Keppra bottle which is about 62 years old, not sure if patient is taking his medications or not. ?  ?Patient was noted to have DKA, subclinical seizure on EEG and incidental finding of COVID-19 viral infection.  Neurology was consulted and patient was transferred to [facility] for continuous EEG.  Admitted to the hospital service.  Now DKA resolved.  Patient is off isolation of COVID-19.  Psychiatry consulted for agitation recommended Keppra should be changed to  Depakote. ?  ? ?  ?Assessment and Plan: ?* New onset seizure ([place]) ?Suspect due to lowered seizure threshold, precipitated by DKA/COVID. Neurology was consulted and followed during initial hospital course.  Discussed with psychiatry, given behavioral disturbances may be related to [place] use, Keppra now dc'd. reduced in favor of Depakote. ?--Depakote level 65 on 3/16 ?--Depakote 500 mg BID ?--Outpatient follow-up with neurology in 2 to 3 months. ? ?Sepsis ([place]) ?Patient more lethargic on 3/16; with elevated WBC count of 15.7, temperature of 102.1 and heart rate of 128.  Chest x-ray with new 1.5 cm nodular density right hilum with heterogeneous opacities right middle and lower lobes, consistent with atelectasis versus infiltrate.  Urinalysis with large leukocytes, negative nitrite, rare bacteria, greater than 50 WBCs.  Etiology likely secondary to UTI versus pneumonia.  Patient received 30 mL/kg IV fluid resuscitation.  CT chest/abdomen/pelvis remarkable for right lower lobe pulmonary consolidation consistent with pneumonia, nonspecific patchy GGO right upper/left lower lobe.  ?--WBC 7.0 (3/12)>15.7 (3/16)>26.6>14.8 ?--Lactic acid 1.5>2.5>4.3>1.6>1.5 ?--PCT 0.10>0.27>0.21 ?--Blood cultures x2: No growth x 1 day ?--Urinalysis: + yeast ?--MRSA PCR negative; DC Vancomycin 3/18 ?--Zosyn (allergy to cephalosporin), plan 7 day course ?--Fluconazole 200 mg p.o. daily x14 days ?--Tylenol PRN ?--CBC daily ? ?Acute metabolic encephalopathy ?Improved since admission, but seems to be waxing and waning.   ?Likely complicated by hospital-acquired delirium and fluctuations in glucose due to noncompliance with diet.  ?--Continue PT/OT efforts while inpatient ?--Plan discharge to SNF for rehab when medically ready ? ?  DKA (diabetic ketoacidosis) ([place]) ?DKA resolved.  Hemoglobin A1c 15.0 consistent with noncompliance. ?Now transitioned to subcutaneous insulin.   ?--Diabetic educator following, appreciate assistance ?--Insulin as  below ? ?Type 2 diabetes mellitus with hyperlipidemia ([place]) ?HbA1c 15.0> poor control. Suspect noncompliance. ?--Semglee 18 units daily. ?--NovoLog 2 units TID AC ?--Sliding scale for coverage ?--CBGs qAC/HS ? ? ?Essential hypertension ?Benazepril discontinued due to hyperkalemia. ?--BP stable off of antihypertensives ? ?Hypokalemia; hyperkalemia ?Initially repleted during this hospitalization.  Potassium increased and was given Lokelma.  Benazepril discontinued. ?--K 3.8 on 3/18 ? ?Restless leg syndrome ?--ropinirole ?--gabapentin 200 PO TID ? ?Elevated troponin ?Etiology likely secondary to type II demand ischemia in the setting of DKA and seizure as above.  EKG with no concerning dynamic changes.  No concerning arrhythmias noted on telemetry. ? ?History of stroke ?Continue statin. ?Not on aspirin/Plavix outpatient ? ?Hypomagnesemia ?Replaced.  Continue to monitor ? ?COVID-19 virus infection ?Incidental finding with+ PCR on 04/05/2021.  Completed 10 days of airborne/contact isolation, now discontinued on 3/5 ? ?Status post colostomy [facility]) ?Wound care consulted for continued colostomy recommendations while inpatient. ? ?COPD, mild ([place]) ?Stable.  Currently on room air ?--Continue albuterol MDI 1-2 puffs q4h PRN SOB/wheezing.  ? ?Protein-calorie malnutrition, severe ?Nutrition Status: ?Nutrition Problem: Severe Malnutrition ?Etiology: chronic illness (cancer, lupus) ?Signs/Symptoms: severe muscle depletion, severe fat depletion, percent weight loss ?Percent weight loss: 16.5 % (x5 months) ?Seen by nutrition.  Regular diet with thin liquids.  Continue to encourage increased supplementation. ? ?Pressure injury of skin ?Continue local wound care, offloading. ? ?Behavior related to cognitive impairment ?Patient initially presenting to the ED with confusion and was found to be in DKA with subclinical seizure which have been adequately treated.  Patient was also found and noted by EMS to be residing in a home that is  uninhabitable.  Even with correction of multiple metabolic issues; patient remains unable to understand and adequately reason regarding her chronic comorbidities and treatment plan likely due to underlying cognitive deficits.  Given this, she is unable to manage her activities of daily living, medication administration and live alone as she unable to take care of herself independently.  Patient would benefit from having guardianship placed. ?--Psychiatry following, appreciate assistance ?--Continue Depakote 500 mg BID (Depakote level 65 3/16) ?--Continue clonazepam 0.'25mg'$  BID ?--Continue melatonin '6mg'$  PO qHS ?--Started olanzapine 5 mg PO qHS (qTC 458 3/13) ?--Await further psychiatry recommendations regarding dose adjustments ? ? ?Hyperkalemia-resolved as of 04/20/2021 ?Benazepril discontinued.  Now potassium improved. ?Discontinued Lokelma ?--K 3.8 this am ?--Continue intermittent monitoring of BMP ? ? ? ?DVT prophylaxis:   Lovenox ?  Code Status: Full Code ?Family Communication: No family present at bedside this morning ? ?Disposition Plan:  ?Level of care: Med-Surg ?Status is: Inpatient ?Remains inpatient appropriate because: Continues on IV antibiotics, continues to require restraints, pending SNF placement once medically stable ? ?  ? ?Consultants:  ?Neurology - signed off 2/27 ?Psychiatry ? ?Procedures:  ?EEG ? ?Antimicrobials:  ?Vancomycin 3/16 - 3/18 ?Zosyn 3/16>> ?Fluconazole 3/17>> ? ? ? ? ?Subjective: ?Patient seen and examined at bedside, lying in bed.  Sleeping but arousable.  Remains confused.  In wrist restraints.  No sitter present this morning.  Reports she feels fatigued, poor sleep overnight.  No other specific complaints or concerns at this time.  Denies headache, no chest pain, no shortness of breath, no abdominal pain, no cough/congestion, no chills/night sweats, no nausea/vomiting/diarrhea, no fatigue, no paresthesias.  No acute events reported overnight by nursing staff. ? ?  Objective: ?Vitals:   ? 04/29/21 0054 04/29/21 0414 04/29/21 0500 04/29/21 0746  ?BP: 118/76 126/87  (!) 158/109  ?Pulse: 89 76    ?Resp: '20 19  18  '$ ?Temp: 98.7 ?F (37.1 ?C) 98.6 ?F (37 ?C)  98.3 ?F (36.8 ?C)  ?TempSrc: Axillary Axillary  O

## 2021-04-30 LAB — CBC
HCT: 32.1 % — ABNORMAL LOW (ref 36.0–46.0)
Hemoglobin: 9.7 g/dL — ABNORMAL LOW (ref 12.0–15.0)
MCH: 28.9 pg (ref 26.0–34.0)
MCHC: 30.2 g/dL (ref 30.0–36.0)
MCV: 95.5 fL (ref 80.0–100.0)
Platelets: UNDETERMINED 10*3/uL (ref 150–400)
RBC: 3.36 MIL/uL — ABNORMAL LOW (ref 3.87–5.11)
RDW: 15.9 % — ABNORMAL HIGH (ref 11.5–15.5)
WBC: 20 10*3/uL — ABNORMAL HIGH (ref 4.0–10.5)
nRBC: 0 % (ref 0.0–0.2)

## 2021-04-30 LAB — GLUCOSE, CAPILLARY
Glucose-Capillary: 65 mg/dL — ABNORMAL LOW (ref 70–99)
Glucose-Capillary: 119 mg/dL — ABNORMAL HIGH (ref 70–99)
Glucose-Capillary: 131 mg/dL — ABNORMAL HIGH (ref 70–99)
Glucose-Capillary: 217 mg/dL — ABNORMAL HIGH (ref 70–99)
Glucose-Capillary: 256 mg/dL — ABNORMAL HIGH (ref 70–99)
Glucose-Capillary: 294 mg/dL — ABNORMAL HIGH (ref 70–99)

## 2021-04-30 MED ORDER — OLANZAPINE 2.5 MG PO TABS
7.5000 mg | ORAL_TABLET | Freq: Every day | ORAL | Status: DC
  Administered 2021-04-30 – 2021-05-08 (×9): 7.5 mg via ORAL
  Filled 2021-04-30 (×9): qty 3

## 2021-04-30 NOTE — Progress Notes (Signed)
Pharmacy Antibiotic Note ? ?Brittany [name] is a 61 y.o. female admitted on 04/05/2021 with sepsis secondary to UTI vs. pneumonia. WBC 20, afebrile (on acetaminophen and ibuprofen), last PCT 0.21. Pharmacy has been consulted for piperacillin/tazobactam dosing. ? ?Plan: ?-Continue piperacillin/tazobactam IV 3.375 g Q8H (extended infusion) ?-Monitor cultures, clinical status, cultures, and length of therapy ? ?Height: 5' (152.4 cm) ?Weight: 44.6 kg (98 lb 5.2 oz) ?IBW/kg (Calculated) : 45.5 kg ? ?Temp (24hrs), Avg:98.7 ?F (37.1 ?C), Min:97.8 ?F (36.6 ?C), Max:99.9 ?F (37.7 ?C) ? ?Recent Labs  ?Lab 04/25/21 ?8938 04/26/21 ?0612 04/27/21 ?0404 04/27/21 ?1043 04/27/21 ?1356 04/27/21 ?1603 04/28/21 ?0451 04/29/21 ?1017 04/30/21 ?5102  ?WBC  --   --  15.7*  --   --   --  26.6* 14.8* 20.0*  ?CREATININE 0.79 0.76 0.87  --   --   --  0.79 0.71  --   ?LATICACIDVEN  --   --   --  1.5 2.5* 4.3* 1.6 1.5  --   ?  ?Estimated Creatinine Clearance: 52 mL/min (by C-G formula based on SCr of 0.71 mg/dL).   ? ?Allergies  ?Allergen Reactions  ? Cephalosporins Itching  ?  TOLERATED ZOSYN (PIPERACILLIN) BEFORE  ? Hydromorphone Itching  ? Keflin [Cephalothin] Itching  ? Lactose Intolerance (Gi) Diarrhea  ? ? ?Antimicrobials this admission: ?Vancomycin 3/16 ?Piperacillin/tazobactam 3/16 >> (3/23) ?Fluconazole 3/17 >> (3/30) ? ?Dose adjustments this admission: N/A ? ?Microbiology results: ?3/5 BCx: NGTD (final) ?3/16 UCx: 10K colonies/mL yeast (final)  ?3/16 MRSA PCR: negative ?3/16 BCx: NGTD x 2 days ? ?Thank you for allowing pharmacy to be a part of this patient?s care. ? ?Shauna Hugh, PharmD, RPh  ?[id number] Pharmacy Resident ?04/30/2021 8:33 AM ? ?Please check [email or web address] for unit-specific pharmacy phone numbers. ? ? ?

## 2021-04-30 NOTE — Consult Note (Signed)
Brief Consult note ? ?Patient seen and chart reviewed. Patient received haldol @ 315 am for agitation. Patient seen this AM; she is laying in bed in NAD with wrist restraints in place. Attempted to wake patient up multiple times with tactile and verbal stimulation; however, she is unable to wake up to participate in assessment and briefly opens eyes before falling back asleep. ? ?Recommend to increase night time zyprexa to 7.5 mg  due to ongoing needs for PRN haldol for agitation ? ?EKG with qtc 458 on 3/13-recommend to obtain new Ekg to monitor qtc. ? ?Psychiatry to continue to follow ? ?Communicated recommendations to primary team via epic secure chat ? ?Ival Bible, MD ?3:40 PM 04/30/2021 ? ? ?

## 2021-04-30 NOTE — Progress Notes (Signed)
?PROGRESS NOTE ? ? ? ?Brittany [name]  XBM:[id number] DOB: 1960/05/15 DOA: 04/05/2021 ?PCP: [facility]  ? ? ?Brief Narrative:  ?Brittany [name] is a 61 year old female with PMH significant for hypertension, diabetes mellitus, CVA with right-sided weakness, COPD, tobacco abuse, and status post pancreatectomy and splenectomy, anxiety/depression, discoid lupus erythematosus, hyperlipidemia who presented to [facility] ED on 2/22 via EMS with confusion.   ? ?Per her daughter at baseline, patient is oriented x 3. She has a history of stroke with right-sided weakness. Normally pt can use a cane to walk. This AM, pt was found to be on the floor with confusion. Pt has jerking and slurred speech per her daughter.  ?  ?Per EDP, EMS reported that the house was extremely filthy. The patient appeared to be in a soiled diaper. Pt has an colostomy with no collection bag in place. Daughter did not know which medications the patient was on.  EMS states they witnessed what could be a partial seizure, and gave the patient a total of 4 mg of intranasal Versed in route to the hospital. When I saw pt in ED. Pt was confused, not orientated x3. She has right-sided weakness which is likely from previous stroke.   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 Pt does not seem to have chest pain.  Not sure if patient has symptoms of UTI.  Her daughter is not sure if patient has a history of seizure.  Daughter states that she found a Keppra bottle which is about 62 years old, not sure if patient is taking his medications or not. ?  ?Patient was noted to have DKA, subclinical seizure on EEG and incidental finding of COVID-19 viral infection.  Neurology was consulted and patient was transferred to [facility] for continuous EEG.  Admitted to the hospital service.  Now DKA resolved.  Patient is off isolation of COVID-19.  Psychiatry consulted for agitation recommended Keppra should be changed to  Depakote. ?  ? ?  ?Assessment and Plan: ?* New onset seizure ([place]) ?Suspect due to lowered seizure threshold, precipitated by DKA/COVID. Neurology was consulted and followed during initial hospital course.  Discussed with psychiatry, given behavioral disturbances may be related to [place] use, Keppra now dc'd. reduced in favor of Depakote. ?--Depakote level 65 on 3/16 ?--Depakote 500 mg BID ?--Outpatient follow-up with neurology in 2 to 3 months. ? ?Sepsis ([place]) ?Patient more lethargic on 3/16; with elevated WBC count of 15.7, temperature of 102.1 and heart rate of 128.  Chest x-ray with new 1.5 cm nodular density right hilum with heterogeneous opacities right middle and lower lobes, consistent with atelectasis versus infiltrate.  Urinalysis with large leukocytes, negative nitrite, rare bacteria, greater than 50 WBCs.  Etiology likely secondary to UTI versus pneumonia.  Patient received 30 mL/kg IV fluid resuscitation.  CT chest/abdomen/pelvis remarkable for right lower lobe pulmonary consolidation consistent with pneumonia, nonspecific patchy GGO right upper/left lower lobe.  ?--WBC 7.0 (3/12)>15.7 (3/16)>26.6>14.8>20.0 ?--Lactic acid 1.5>2.5>4.3>1.6>1.5 ?--PCT 0.10>0.27>0.21 ?--Blood cultures x2: No growth x 3 days ?--Urinalysis: + yeast ?--MRSA PCR negative; DC'd Vancomycin 3/18 ?--Zosyn (allergy to cephalosporin), plan 7 day course ?--Fluconazole 200 mg p.o. daily x14 days ?--Tylenol PRN ?--CBC daily ? ?Acute metabolic encephalopathy ?Improved since admission, but seems to be waxing and waning.   ?Likely complicated by hospital-acquired delirium and fluctuations in glucose due to noncompliance with diet.  ?--Continue PT/OT efforts while inpatient ?--Plan discharge to SNF for rehab when medically ready ? ?  DKA (diabetic ketoacidosis) ([place]) ?DKA resolved.  Hemoglobin A1c 15.0 consistent with noncompliance. ?Now transitioned to subcutaneous insulin.   ?--Diabetic educator following, appreciate assistance ?--Insulin  as below ? ?Type 2 diabetes mellitus with hyperlipidemia ([place]) ?HbA1c 15.0> poor control. Suspect noncompliance. ?--Semglee 18 units daily. ?--NovoLog 2 units TID AC ?--Sliding scale for coverage ?--CBGs qAC/HS ? ? ?Essential hypertension ?Benazepril discontinued due to hyperkalemia. ?--BP stable off of antihypertensives ? ?Hypokalemia; hyperkalemia ?Initially repleted during this hospitalization.  Potassium increased and was given Lokelma.  Benazepril discontinued. ?--K 3.8 on 3/18 ? ?Restless leg syndrome ?--ropinirole ?--gabapentin 200 PO TID ? ?Elevated troponin ?Etiology likely secondary to type II demand ischemia in the setting of DKA and seizure as above.  EKG with no concerning dynamic changes.  No concerning arrhythmias noted on telemetry. ? ?History of stroke ?Continue statin. ?Not on aspirin/Plavix outpatient ? ?Hypomagnesemia ?Replaced.  Continue to monitor ? ?COVID-19 virus infection ?Incidental finding with+ PCR on 04/05/2021.  Completed 10 days of airborne/contact isolation, now discontinued on 3/5 ? ?Status post colostomy [facility]) ?Wound care consulted for continued colostomy recommendations while inpatient. ? ?COPD, mild ([place]) ?Stable.  Currently on room air ?--Continue albuterol MDI 1-2 puffs q4h PRN SOB/wheezing.  ? ?Protein-calorie malnutrition, severe ?Nutrition Status: ?Nutrition Problem: Severe Malnutrition ?Etiology: chronic illness (cancer, lupus) ?Signs/Symptoms: severe muscle depletion, severe fat depletion, percent weight loss ?Percent weight loss: 16.5 % (x5 months) ?Seen by nutrition.  Regular diet with thin liquids.  Continue to encourage increased supplementation. ? ?Pressure injury of skin ?Continue local wound care, offloading. ? ?Behavior related to cognitive impairment ?Patient initially presenting to the ED with confusion and was found to be in DKA with subclinical seizure which have been adequately treated.  Patient was also found and noted by EMS to be residing in a home that is  uninhabitable.  Even with correction of multiple metabolic issues; patient remains unable to understand and adequately reason regarding her chronic comorbidities and treatment plan likely due to underlying cognitive deficits.  Given this, she is unable to manage her activities of daily living, medication administration and live alone as she unable to take care of herself independently.  Patient would benefit from having guardianship placed. ?--Psychiatry following, appreciate assistance ?--Continue Depakote 500 mg BID (Depakote level 65 3/16) ?--Continue clonazepam 0.'25mg'$  BID ?--Continue melatonin '6mg'$  PO qHS ?--Started olanzapine 5 mg PO qHS (qTC 458 3/13) ?--Await further psychiatry recommendations regarding dose adjustments ? ? ?Hyperkalemia-resolved as of 04/20/2021 ?Benazepril discontinued.  Now potassium improved. ?Discontinued Lokelma ?--K 3.8 3/18 ?--Continue intermittent monitoring of BMP ? ? ? ?DVT prophylaxis:   Lovenox ?  Code Status: Full Code ?Family Communication: No family present at bedside this morning ? ?Disposition Plan:  ?Level of care: Med-Surg ?Status is: Inpatient ?Remains inpatient appropriate because: Continues on IV antibiotics, continues to require restraints, pending SNF placement once medically stable ? ?  ? ?Consultants:  ?Neurology - signed off 2/27 ?Psychiatry ? ?Procedures:  ?EEG ? ?Antimicrobials:  ?Vancomycin 3/16 - 3/18 ?Zosyn 3/16>> ?Fluconazole 3/17>> ? ? ? ? ?Subjective: ?Patient seen and examined at bedside, lying in bed.  Eating breakfast, RN assisting.  Remains in soft wrist restraints.  Received Haldol overnight due to agitation and attempting to get out of bed.  Remains confused, but improved since yesterday.  No other specific complaints or concerns at this time.  Denies headache, no chest pain, no shortness of breath, no abdominal pain, no cough/congestion, no chills/night sweats, no nausea/vomiting/diarrhea, no fatigue, no paresthesias.  No other acute  events reported  overnight by nursing staff. ? ?Objective: ?Vitals:  ? 04/29/21 2031 04/29/21 2348 04/30/21 0404 04/30/21 0500  ?BP: (!) 150/84 125/86 113/84   ?Pulse: (!) 103 98 96   ?Resp: '20 19 20   '$ ?Temp: 97.8 ?F (36.6 ?C) 99.9

## 2021-04-30 NOTE — Plan of Care (Signed)
Patient remains in restraints, will continue less restrictive measures  ? ? ?Problem: Education: ?Goal: Knowledge of General Education information will improve ?Description: Including pain rating scale, medication(s)/side effects and non-pharmacologic comfort measures ?Outcome: Not Progressing ?  ?Problem: Health Behavior/Discharge Planning: ?Goal: Ability to manage health-related needs will improve ?Outcome: Progressing ?  ?Problem: Clinical Measurements: ?Goal: Ability to maintain clinical measurements within normal limits will improve ?Outcome: Progressing ?Goal: Will remain free from infection ?Outcome: Progressing ?Goal: Diagnostic test results will improve ?Outcome: Progressing ?Goal: Respiratory complications will improve ?Outcome: Progressing ?Goal: Cardiovascular complication will be avoided ?Outcome: Progressing ?  ?Problem: Activity: ?Goal: Risk for activity intolerance will decrease ?Outcome: Not Progressing ?  ?Problem: Nutrition: ?Goal: Adequate nutrition will be maintained ?Outcome: Progressing ?  ?Problem: Coping: ?Goal: Level of anxiety will decrease ?Outcome: Progressing ?  ?Problem: Elimination: ?Goal: Will not experience complications related to bowel motility ?Outcome: Progressing ?Goal: Will not experience complications related to urinary retention ?Outcome: Progressing ?  ?Problem: Skin Integrity: ?Goal: Risk for impaired skin integrity will decrease ?Outcome: Progressing ?  ?

## 2021-05-01 ENCOUNTER — Encounter: Payer: Medicare Other | Admitting: Surgery

## 2021-05-01 LAB — CBC
HCT: 28.9 % — ABNORMAL LOW (ref 36.0–46.0)
Hemoglobin: 9.3 g/dL — ABNORMAL LOW (ref 12.0–15.0)
MCH: 29.3 pg (ref 26.0–34.0)
MCHC: 32.2 g/dL (ref 30.0–36.0)
MCV: 91.2 fL (ref 80.0–100.0)
Platelets: 567 10*3/uL — ABNORMAL HIGH (ref 150–400)
RBC: 3.17 MIL/uL — ABNORMAL LOW (ref 3.87–5.11)
RDW: 15.5 % (ref 11.5–15.5)
WBC: 17.6 10*3/uL — ABNORMAL HIGH (ref 4.0–10.5)
nRBC: 0.1 % (ref 0.0–0.2)

## 2021-05-01 LAB — COMPREHENSIVE METABOLIC PANEL
ALT: 18 U/L (ref 0–44)
AST: 17 U/L (ref 15–41)
Albumin: 1.9 g/dL — ABNORMAL LOW (ref 3.5–5.0)
Alkaline Phosphatase: 94 U/L (ref 38–126)
Anion gap: 8 (ref 5–15)
BUN: 16 mg/dL (ref 8–23)
CO2: 26 mmol/L (ref 22–32)
Calcium: 8.8 mg/dL — ABNORMAL LOW (ref 8.9–10.3)
Chloride: 100 mmol/L (ref 98–111)
Creatinine, Ser: 0.89 mg/dL (ref 0.44–1.00)
GFR, Estimated: >60 mL/min (ref >60)
Glucose, Bld: 199 mg/dL — ABNORMAL HIGH (ref 70–99)
Potassium: 4.4 mmol/L (ref 3.5–5.1)
Sodium: 134 mmol/L — ABNORMAL LOW (ref 135–145)
Total Bilirubin: 0.4 mg/dL (ref 0.3–1.2)
Total Protein: 7.6 g/dL (ref 6.5–8.1)

## 2021-05-01 LAB — GLUCOSE, CAPILLARY
Glucose-Capillary: 191 mg/dL — ABNORMAL HIGH (ref 70–99)
Glucose-Capillary: 108 mg/dL — ABNORMAL HIGH (ref 70–99)
Glucose-Capillary: 275 mg/dL — ABNORMAL HIGH (ref 70–99)
Glucose-Capillary: 433 mg/dL — ABNORMAL HIGH (ref 70–99)
Glucose-Capillary: 197 mg/dL — ABNORMAL HIGH (ref 70–99)

## 2021-05-01 LAB — LACTIC ACID, PLASMA: Lactic Acid, Venous: 1.8 mmol/L (ref 0.5–1.9)

## 2021-05-01 LAB — MAGNESIUM: Magnesium: 2.1 mg/dL (ref 1.7–2.4)

## 2021-05-01 LAB — PHOSPHORUS: Phosphorus: 3.5 mg/dL (ref 2.5–4.6)

## 2021-05-01 LAB — PROCALCITONIN: Procalcitonin: 0.37 ng/mL

## 2021-05-01 MED ORDER — INSULIN ASPART 100 UNIT/ML IJ SOLN
10.0000 [IU] | Freq: Once | INTRAMUSCULAR | Status: AC
  Administered 2021-05-01: 10 [IU] via SUBCUTANEOUS

## 2021-05-01 NOTE — Progress Notes (Signed)
Nutrition Follow-up ? ?DOCUMENTATION CODES:  ?Severe malnutrition in context of chronic illness, Underweight ? ?INTERVENTION:  ?-continue Glucerna Shake po TID, each supplement provides 220 kcal and 10 grams of protein, 16 grams carbohydrate ?-continue MVI with minerals daily ? ?NUTRITION DIAGNOSIS:  ?Severe Malnutrition related to chronic illness (cancer, lupus) as evidenced by severe muscle depletion, severe fat depletion, percent weight loss. -- ongoing ? ?GOAL:  ?Patient will meet greater than or equal to 90% of their needs -- progressing ? ?MONITOR:  ?Diet advancement, Labs, Weight trends, Skin, I & O's ? ?REASON FOR ASSESSMENT:  ?Malnutrition Screening Tool ?  ? ?ASSESSMENT:  ?Pt with PMH significant for DM, HTN, lupus, Ca, CVA w/ R-sided weakness, COPD, and s/p pancreatectomy and splenectomy admitted with acute metabolic encephalopathy and DKA. EEG noted subclinical seizure. ? ?2/24 - Cortrak placed (gastric tip confirmed via xray) ?2/25 - diet advanced to full liquids ?2/26 - diet advanced to dysphagia 2 with thin liquids ?2/27 - cortrak removed ?3/01 - diet advanced to regular texture ? ?Per MD, patient was noted to have DKA, subclinical seizure on EEG and incidental finding of COVID-19 viral infection.  Neurology was consulted and patient was [facility].  Admitted to the hospital service.  Now DKA resolved.  Patient is off isolation of COVID-19.  Psychiatry consulted for agitation recommended Keppra should be changed to Depakote. Pt to discharge to SNF when medically appropriate.  ? ?Pt's appetite has been good since last RD visit with last 8 meal completions charted as 25-100% intake (~82% avg intake). Only exception to pt's good intake was around 3/17 when pt was noted to be more lethargic than usual, though this appears to have resolved. Pt without additional complaints at this time. Per RN, pt continues to do well with Glucerna supplements (ordered TID).  Recommend continue current nutrition plan of care  ? ?Medications: SSI TID w/ meals and bedtime, 2 units novolog TID w/ meals, 18 units semglee daily, creon TID w/ meals, remeron, thiamine, IV abx, mvi with minerals ?Labs: Na 134 (L) ?CBGs: 108-294 x24 hours - diabetes coordinator following ?A1c (last taken 04/06/21) 15.0 ? ?UOP: 4x unmeasured occurrences x24 hours ?Colostomy output: 1084m x24 hours ?I/O: + [id number] admit ? ?Admit wt 43.6 kg ?Current wt 46.5kg ? ?Diet Order:   ?Diet Order   ? ?       ?  Diet Carb Modified Fluid consistency: Thin; Room service appropriate? Yes with Assist  Diet effective now       ?  ? ?  ?  ? ?  ? ?EDUCATION NEEDS:  ?Not appropriate for education at this time ? ?Skin:  Skin Assessment: Skin Integrity Issues: ?Skin Integrity Issues:: Other (Comment) ?Incisions: abdomen (from previous admit) ?Other: MASD buttocks ? ?Last BM:  3/20 via colostomy ? ?Height:  ?Ht Readings from Last 1 Encounters:  ?04/05/21 5' (1.524 m)  ? ?Weight:  ?Wt Readings from Last 1 Encounters:  ?05/01/21 46.5 kg  ? ?BMI:  Body mass index is 20.02 kg/m?. ? ?Estimated Nutritional Needs:  ?Kcal:  1400-1600 ?Protein:  70-80 grams ?Fluid:  >1.4L ? ? ?AmTheone Stanley MS, RD, LDN (she/her/hers) ?RD pager number and weekend/on-call pager number located in [facility]? ?
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
## 2021-05-01 NOTE — Progress Notes (Signed)
Dr. [place] notified of patients FSBS.  Additional insulin ordered.  See MAR. ?
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
## 2021-05-01 NOTE — Progress Notes (Signed)
Physical Therapy Treatment ?Patient Details ?Name: Brittany [name] ?MRN: [id number] ?DOB: 04-Oct-1960 ?Today's Date: 05/01/2021 ? ? ?History of Present Illness Brittany [name] is a 2yoF who was brought to the ED for confusion plus DKA.  EEG noted subclinical seizure.  Also found to have incidental COVID. 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 Pt admitted with diverticulitis of intestine with abscess. ? ?  ?PT Comments  ? ? Pt making very slow progress. Continue to recommend SNF at time of dc.    ?Recommendations for follow up therapy are one component of a multi-disciplinary discharge planning process, led by the attending physician.  Recommendations may be updated based on patient status, additional functional criteria and insurance authorization. ? ?Follow Up Recommendations ? Skilled nursing-short term rehab (<3 hours/day) ?  ?  ?Assistance Recommended at Discharge Frequent or constant Supervision/Assistance  ?Patient can return home with the following Assistance with cooking/housework;Direct supervision/assist for medications management;Help with stairs or ramp for entrance;Assist for transportation;A little help with walking and/or transfers;A little help with bathing/dressing/bathroom ?  ?Equipment Recommendations ? None recommended by PT  ?  ?Recommendations for Other Services   ? ? ?  ?Precautions / Restrictions Precautions ?Precautions: Fall ?Precaution Comments: seizure, urinary incontinence, colostomy bag frequently comes off ?Restrictions ?Weight Bearing Restrictions: No  ?  ? ?Mobility ? Bed Mobility ?Overal bed mobility: Needs Assistance ?Bed Mobility: Supine to Sit ?  ?  ?Supine to sit: Min assist ?  ?  ?General bed mobility comments: Assist to elevate trunk into sitting ?  ? ?Transfers ?Overall transfer level: Needs assistance ?Equipment used: Rolling walker (2  wheels) ?Transfers: Sit to/from Stand ?Sit to Stand: Min assist ?  ?  ?  ?  ?  ?General transfer comment: Assist to bring hips up and for balance ?  ? ?Ambulation/Gait ?Ambulation/Gait assistance: Min assist ?Gait Distance (Feet): 120 Feet ?Assistive device: Rolling walker (2 wheels) ?Gait Pattern/deviations: Step-through pattern, Narrow base of support, Drifts right/left, Decreased step length - right, Decreased step length - left, Shuffle, Staggering left ?Gait velocity: decr ?Gait velocity interpretation: <1.31 ft/sec, indicative of household ambulator ?  ?General Gait Details: Assist for balance and to guide walker to prevent running into objects on rt side. Verbal cues to widen stance ? ? ?Stairs ?  ?  ?  ?  ?  ? ? ?Wheelchair Mobility ?  ? ?Modified Rankin (Stroke Patients Only) ?  ? ? ?  ?Balance Overall balance assessment: Needs assistance ?Sitting-balance support: No upper extremity supported, Feet supported ?Sitting balance-Leahy Scale: Good ?  ?  ?Standing balance support: Bilateral upper extremity supported, During functional activity ?Standing balance-Leahy Scale: Poor ?Standing balance comment: walker and min guard for static standing ?  ?  ?  ?  ?  ?  ?  ?  ?  ?  ?  ?  ? ?  ?Cognition Arousal/Alertness: Awake/alert ?Behavior During Therapy: [facility] for tasks assessed/performed ?Overall Cognitive Status: No family/caregiver present to determine baseline cognitive functioning ?Area of Impairment: Following commands, Safety/judgement, Awareness, Problem solving ?  ?  ?  ?  ?  ?  ?  ?  ?Orientation Level: Disoriented to, Time, Situation ?Current Attention Level: Sustained ?Memory: Decreased recall of precautions, Decreased short-term memory ?Following Commands: Follows one step commands with increased time ?Safety/Judgement: Decreased awareness of safety, Decreased awareness of deficits ?Awareness: Intellectual ?Problem Solving: Slow processing,  Decreased initiation, Requires verbal cues ?  ?  ?  ? ?   ?Exercises   ? ?  ?General Comments   ?  ?  ? ?Pertinent Vitals/Pain Pain Assessment ?Pain Assessment: No/denies pain  ? ? ?Home Living   ?  ?  ?  ?  ?  ?  ?  ?  ?  ?   ?  ?Prior Function    ?  ?  ?   ? ?PT Goals (current goals can now be found in the care plan section) Acute Rehab PT Goals ?PT Goal Formulation: Patient unable to participate in goal setting ?Time For Goal Achievement: 05/15/21 ?Potential to Achieve Goals: Fair ?Progress towards PT goals: Goals downgraded-see care plan ? ?  ?Frequency ? ? ? Min 2X/week ? ? ? ?  ?PT Plan Current plan remains appropriate  ? ? ?Co-evaluation   ?  ?  ?  ?  ? ?  ?AM-PAC PT "6 Clicks" Mobility   ?Outcome Measure ? Help needed turning from your back to your side while in a flat bed without using bedrails?: A Little ?Help needed moving from lying on your back to sitting on the side of a flat bed without using bedrails?: A Little ?Help needed moving to and from a bed to a chair (including a wheelchair)?: A Little ?Help needed standing up from a chair using your arms (e.g., wheelchair or bedside chair)?: A Little ?Help needed to walk in hospital room?: A Little ?Help needed climbing 3-5 steps with a railing? : A Lot ?6 Click Score: 17 ? ?  ?End of Session Equipment Utilized During Treatment: Gait belt ?Activity Tolerance: Patient tolerated treatment well ?Patient left: in chair;with call bell/phone within reach;with nursing/sitter in room (sitter present) ?Nurse Communication: Mobility status ?PT Visit Diagnosis: Unsteadiness on feet (R26.81);Muscle weakness (generalized) (M62.81);Difficulty in walking, not elsewhere classified (R26.2);Ataxic gait (R26.0) ?  ? ? ?Time: 8502-7741 ?PT Time Calculation (min) (ACUTE ONLY): 15 min ? ?Charges:  $Gait Training: 8-22 mins          ?          ? ?[facility] PT ?Acute Rehabilitation Services ?Pager [phone] ?Office [phone] ? ? ? ?Shary Decamp [facility] ?05/01/2021, 6:01 PM ? ?

## 2021-05-01 NOTE — Consult Note (Signed)
Brief Psychiatry Consult Note  ?The patient was last seen by the psychiatry service on 3/20. Had messaged Dr. [place] early in day asking for repeat CMP/VPA level tomorrow AM. Attempted to eval pt in late afternoon, pt sleeping. Discussed with sitter at bedside (had worked with pt last week), pt had been less confused than late last week. Intermittently verbally agitated but out of restraints through shift, no further episodes of physical aggression.  ? ?- will review CMP and trough VPA tomorrow AM ?- did not wake up pt (needs rest) ? ?Massey Ruhland A Serina Nichter ? ?

## 2021-05-02 LAB — CULTURE, BLOOD (ROUTINE X 2)
Culture: NO GROWTH
Culture: NO GROWTH
Special Requests: ADEQUATE
Special Requests: ADEQUATE

## 2021-05-02 LAB — COMPREHENSIVE METABOLIC PANEL
ALT: 16 U/L (ref 0–44)
AST: 19 U/L (ref 15–41)
Albumin: 2 g/dL — ABNORMAL LOW (ref 3.5–5.0)
Alkaline Phosphatase: 102 U/L (ref 38–126)
Anion gap: 9 (ref 5–15)
BUN: 18 mg/dL (ref 8–23)
CO2: 26 mmol/L (ref 22–32)
Calcium: 8.8 mg/dL — ABNORMAL LOW (ref 8.9–10.3)
Chloride: 99 mmol/L (ref 98–111)
Creatinine, Ser: 0.89 mg/dL (ref 0.44–1.00)
GFR, Estimated: >60 mL/min (ref >60)
Glucose, Bld: 262 mg/dL — ABNORMAL HIGH (ref 70–99)
Potassium: 4.9 mmol/L (ref 3.5–5.1)
Sodium: 134 mmol/L — ABNORMAL LOW (ref 135–145)
Total Bilirubin: 0.4 mg/dL (ref 0.3–1.2)
Total Protein: 7.6 g/dL (ref 6.5–8.1)

## 2021-05-02 LAB — GLUCOSE, CAPILLARY
Glucose-Capillary: 200 mg/dL — ABNORMAL HIGH (ref 70–99)
Glucose-Capillary: 97 mg/dL (ref 70–99)
Glucose-Capillary: 184 mg/dL — ABNORMAL HIGH (ref 70–99)
Glucose-Capillary: 88 mg/dL (ref 70–99)

## 2021-05-02 LAB — VALPROIC ACID LEVEL: Valproic Acid Lvl: 42 ug/mL — ABNORMAL LOW (ref 50.0–100.0)

## 2021-05-02 LAB — CBC
HCT: 29.9 % — ABNORMAL LOW (ref 36.0–46.0)
Hemoglobin: 9.3 g/dL — ABNORMAL LOW (ref 12.0–15.0)
MCH: 28.7 pg (ref 26.0–34.0)
MCHC: 31.1 g/dL (ref 30.0–36.0)
MCV: 92.3 fL (ref 80.0–100.0)
Platelets: 570 10*3/uL — ABNORMAL HIGH (ref 150–400)
RBC: 3.24 MIL/uL — ABNORMAL LOW (ref 3.87–5.11)
RDW: 15.5 % (ref 11.5–15.5)
WBC: 10.4 10*3/uL (ref 4.0–10.5)
nRBC: 0.2 % (ref 0.0–0.2)

## 2021-05-02 LAB — PROCALCITONIN: Procalcitonin: 0.3 ng/mL

## 2021-05-02 MED ORDER — INSULIN ASPART 100 UNIT/ML IJ SOLN
4.0000 [IU] | Freq: Three times a day (TID) | INTRAMUSCULAR | Status: DC
  Administered 2021-05-02 – 2021-05-05 (×6): 4 [IU] via SUBCUTANEOUS

## 2021-05-02 MED ORDER — DIVALPROEX SODIUM ER 500 MG PO TB24
1250.0000 mg | ORAL_TABLET | Freq: Every day | ORAL | Status: DC
  Administered 2021-05-03 – 2021-05-08 (×6): 1250 mg via ORAL
  Filled 2021-05-02 (×7): qty 1

## 2021-05-02 MED ORDER — INSULIN GLARGINE-YFGN 100 UNIT/ML ~~LOC~~ SOLN
22.0000 [IU] | Freq: Every day | SUBCUTANEOUS | Status: DC
  Administered 2021-05-02 – 2021-05-07 (×6): 22 [IU] via SUBCUTANEOUS
  Filled 2021-05-02 (×7): qty 0.22
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
## 2021-05-03 ENCOUNTER — Inpatient Hospital Stay (HOSPITAL_COMMUNITY): Payer: Medicare Other

## 2021-05-03 DIAGNOSIS — A419 Sepsis, unspecified organism: Secondary | ICD-10-CM

## 2021-05-03 DIAGNOSIS — E875 Hyperkalemia: Secondary | ICD-10-CM

## 2021-05-03 DIAGNOSIS — R6520 Severe sepsis without septic shock: Secondary | ICD-10-CM

## 2021-05-03 DIAGNOSIS — G9340 Encephalopathy, unspecified: Secondary | ICD-10-CM

## 2021-05-03 LAB — GLUCOSE, CAPILLARY
Glucose-Capillary: 141 mg/dL — ABNORMAL HIGH (ref 70–99)
Glucose-Capillary: 118 mg/dL — ABNORMAL HIGH (ref 70–99)
Glucose-Capillary: 140 mg/dL — ABNORMAL HIGH (ref 70–99)
Glucose-Capillary: 169 mg/dL — ABNORMAL HIGH (ref 70–99)
Glucose-Capillary: 135 mg/dL — ABNORMAL HIGH (ref 70–99)
Glucose-Capillary: 241 mg/dL — ABNORMAL HIGH (ref 70–99)

## 2021-05-03 LAB — CBC
HCT: 34.1 % — ABNORMAL LOW (ref 36.0–46.0)
Hemoglobin: 11.1 g/dL — ABNORMAL LOW (ref 12.0–15.0)
MCH: 29.4 pg (ref 26.0–34.0)
MCHC: 32.6 g/dL (ref 30.0–36.0)
MCV: 90.2 fL (ref 80.0–100.0)
Platelets: 672 10*3/uL — ABNORMAL HIGH (ref 150–400)
RBC: 3.78 MIL/uL — ABNORMAL LOW (ref 3.87–5.11)
RDW: 15.3 % (ref 11.5–15.5)
WBC: 16.9 10*3/uL — ABNORMAL HIGH (ref 4.0–10.5)
nRBC: 0.2 % (ref 0.0–0.2)

## 2021-05-03 LAB — BASIC METABOLIC PANEL
Anion gap: 14 (ref 5–15)
BUN: 28 mg/dL — ABNORMAL HIGH (ref 8–23)
CO2: 24 mmol/L (ref 22–32)
Calcium: 9.1 mg/dL (ref 8.9–10.3)
Chloride: 99 mmol/L (ref 98–111)
Creatinine, Ser: 1.01 mg/dL — ABNORMAL HIGH (ref 0.44–1.00)
GFR, Estimated: >60 mL/min (ref >60)
Glucose, Bld: 104 mg/dL — ABNORMAL HIGH (ref 70–99)
Potassium: 5.2 mmol/L — ABNORMAL HIGH (ref 3.5–5.1)
Sodium: 137 mmol/L (ref 135–145)

## 2021-05-03 LAB — BLOOD GAS, ARTERIAL
Acid-Base Excess: 5.5 mmol/L — ABNORMAL HIGH (ref 0.0–2.0)
Bicarbonate: 28.9 mmol/L — ABNORMAL HIGH (ref 20.0–28.0)
Drawn by: 12971
O2 Saturation: 94.8 %
Patient temperature: 37
pCO2 arterial: 37 mmHg (ref 32–48)
pH, Arterial: 7.5 — ABNORMAL HIGH (ref 7.35–7.45)
pO2, Arterial: 73 mmHg — ABNORMAL LOW (ref 83–108)

## 2021-05-03 MED ORDER — LORAZEPAM 2 MG/ML IJ SOLN: 1.0000 mg | Freq: Four times a day (QID) | INTRAMUSCULAR | Status: DC | PRN

## 2021-05-03 MED ORDER — LORAZEPAM 2 MG/ML IJ SOLN
1.0000 mg | Freq: Four times a day (QID) | INTRAMUSCULAR | Status: DC | PRN
Start: 2021-05-03 — End: 2021-05-03

## 2021-05-03 MED ORDER — SODIUM ZIRCONIUM CYCLOSILICATE 10 G PO PACK: 10.0000 g | PACK | Freq: Every day | ORAL | Status: AC

## 2021-05-03 MED ORDER — HALOPERIDOL LACTATE 5 MG/ML IJ SOLN
1.0000 mg | Freq: Four times a day (QID) | INTRAMUSCULAR | Status: DC | PRN
  Administered 2021-05-04 – 2021-05-08 (×3): 1 mg via INTRAVENOUS
  Filled 2021-05-03 (×3): qty 1
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
## 2021-05-03 NOTE — Progress Notes (Signed)
Occupational Therapy Treatment ?Patient Details ?Name: Brittany [name] ?MRN: [id number] ?DOB: [birth year] ?Today's Date: 05/03/2021 ? ? ?History of present illness Brittany [name] is a 7yoF who was brought to the ED for confusion plus DKA.  EEG noted subclinical seizure.  Also found to have incidental COVID. 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 Pt admitted with diverticulitis of intestine with abscess. ?  ?OT comments ? Pt limited this session due to medical needs. RN in room for entire session. Pt presents with low saturation rates, started on 4L [place], upgraded to 10L HF [place], then ended session on 15L nonrebreather. Pt very lethargic throughout session, however was able to participate in some exercises in bed, following 75% of simple commands, and requiring multimodal cuing for grooming tasks. Pt's O2 sats remained in the 80s for entire session. OT session ended when Rapid Response RN entered to assess patient.   ? ?Recommendations for follow up therapy are one component of a multi-disciplinary discharge planning process, led by the attending physician.  Recommendations may be updated based on patient status, additional functional criteria and insurance authorization. ?   ?Follow Up Recommendations ? Skilled nursing-short term rehab (<3 hours/day)  ?  ?Assistance Recommended at Discharge Frequent or constant Supervision/Assistance  ?Patient can return home with the following ? A little help with walking and/or transfers;A little help with bathing/dressing/bathroom;Assistance with cooking/housework;Assistance with feeding;Direct supervision/assist for medications management;Direct supervision/assist for financial management;Assist for transportation;Help with stairs or ramp for entrance ?  ?Equipment Recommendations ? None recommended by OT  ?  ?Recommendations for Other Services   ? ?   ?Precautions / Restrictions Precautions ?Precautions: Fall ?Precaution Comments: seizure, urinary incontinence, colostomy bag frequently comes off ?Restrictions ?Weight Bearing Restrictions: No  ? ? ?  ? ?Mobility Bed Mobility ?  ?  ?  ?  ?  ?  ?  ?General bed mobility comments: Pt assisted with long sit to improve posture - did not do further mobility due to respirations and lethargy ?  ? ?Transfers ?  ?  ?  ?  ?  ?  ?  ?  ?  ?General transfer comment: deferred ?  ?  ?Balance   ?  ?  ?  ?  ?  ?  ?  ?  ?  ?  ?  ?  ?  ?  ?  ?  ?  ?  ?   ? ?ADL either performed or assessed with clinical judgement  ? ?ADL Overall ADL's : Needs assistance/impaired ?  ?  ?Grooming: Wash/dry hands;Wash/dry face;Moderate assistance ?Grooming Details (indicate cue type and reason): Limited due to difficulty sustaining activity and weakness ?  ?  ?  ?  ?  ?  ?  ?  ?  ?  ?  ?  ?  ?  ?  ?General ADL Comments: Did not attempt OOB due to safety - pt lethargic with O2 sats in low 80's. RN requesting OT work with her to try and increase arousal to improve sats. ?  ? ?Extremity/Trunk Assessment   ?  ?  ?  ?  ?  ? ?Vision   ?  ?  ?Perception   ?  ?Praxis   ?  ? ?Cognition Arousal/Alertness: Lethargic, Suspect due to medications ?Behavior During Therapy: Flat affect ?Overall Cognitive Status: No family/caregiver present to determine baseline cognitive functioning ?Area of  Impairment: Following commands, Safety/judgement, Awareness, Problem solving ?  ?  ?  ?  ?  ?  ?  ?  ?Orientation Level: Disoriented to, Time, Place ?Current Attention Level: Sustained ?Memory: Decreased recall of precautions, Decreased short-term memory ?Following Commands: Follows one step commands with increased time ?Safety/Judgement: Decreased awareness of safety, Decreased awareness of deficits ?  ?  ?General Comments: Pt very lethargic, with difficulty following commands this session without tactile cuing. ?  ?  ?   ?Exercises Exercises: Other exercises ?Other  Exercises ?Other Exercises: AAROM UE: punch ups, bicep curls, leteral raises, hand squeezes - 10 reps ?Other Exercises: AAROM LE: ankle pumps, heel slides, leg raises, knee bends - 10 reps ? ?  ?Shoulder Instructions   ? ? ?  ?General Comments Pt on 4L [place] at 82%, then 10L HFNC at 84%, Then 15L nonrebreather 5 mins at 90%. RN in room throughout session as well as sitter.  ? ? ?Pertinent Vitals/ Pain       Pain Assessment ?Pain Assessment: No/denies pain ? ?Home Living   ?  ?  ?  ?  ?  ?  ?  ?  ?  ?  ?  ?  ?  ?  ?  ?  ?  ?  ? ?  ?Prior Functioning/Environment    ?  ?  ?  ?   ? ?Frequency ? Min 2X/week  ? ? ? ? ?  ?Progress Toward Goals ? ?OT Goals(current goals can now be found in the care plan section) ? Progress towards OT goals: Not progressing toward goals - comment (limited due to medical needs this session) ? ?Acute Rehab OT Goals ?Patient Stated Goal: To leave hospital ?OT Goal Formulation: With patient ?Time For Goal Achievement: 05/17/21 ?Potential to Achieve Goals: Good ?ADL Goals ?Pt Will Perform Upper Body Dressing: Independently;sitting ?Pt Will Perform Lower Body Dressing: with modified independence;sit to/from stand ?Pt Will Transfer to Toilet: with modified independence;ambulating ?Additional ADL Goal #1: pt will indep follow 3 step directional task as a precursor to safe ADLs  ?Plan Discharge plan remains appropriate   ? ?Co-evaluation ? ? ?   ?  ?  ?  ?  ? ?  ?AM-PAC OT "6 Clicks" Daily Activity     ?Outcome Measure ? ? Help from another person eating meals?: A Little ?Help from another person taking care of personal grooming?: A Little ?Help from another person toileting, which includes using toliet, bedpan, or urinal?: A Little ?Help from another person bathing (including washing, rinsing, drying)?: A Little ?Help from another person to put on and taking off regular upper body clothing?: A Little ?Help from another person to put on and taking off regular lower body clothing?: A Little ?6 Click  Score: 18 ? ?  ?End of Session Equipment Utilized During Treatment: Oxygen ? ?OT Visit Diagnosis: Unsteadiness on feet (R26.81);Other abnormalities of gait and mobility (R26.89);Muscle weakness (generalized) (M62.81) ?  ?Activity Tolerance Patient tolerated treatment well ?  ?Patient Left in bed;with call bell/phone within reach;with nursing/sitter in room ?  ?Nurse Communication Mobility status ?  ? ?   ? ?Time: 5883-2549 ?OT Time Calculation (min): 19 min ? ?Charges: OT General Charges ?$OT Visit: 1 Visit ?OT Treatments ?$Therapeutic Activity: 8-22 mins ? ?Deshawna Mcneece H., OTR/L ?Acute Rehabilitation ? ?Mendy Chou Elane Yolanda Bonine ?05/03/2021, 3:04 PM ?

## 2021-05-03 NOTE — Progress Notes (Signed)
Patient remains lethargic and unable to swallow safely.  MD aware.  Will monitor.  All meds and meals have been held due to risk of aspiration. ?

## 2021-05-03 NOTE — Progress Notes (Addendum)
MD notified that patient is lethargic and unable to safely swallow foods and medication due to risk of aspiration.  Will monitor. ?

## 2021-05-03 NOTE — Progress Notes (Signed)
?   05/03/21 0348  ?Assess: MEWS Score  ?Temp (!) 102.5 ?F (39.2 ?C) ?(notified RN)  ?BP 102/75  ?Pulse Rate (!) 113  ?Resp 20  ?SpO2 93 %  ?O2 Device Room Air  ?Assess: MEWS Score  ?MEWS Temp 2  ?MEWS Systolic 0  ?MEWS Pulse 2  ?MEWS RR 0  ?MEWS LOC 0  ?MEWS Score 4  ?MEWS Score Color Red  ?Assess: if the MEWS score is Yellow or Red  ?Were vital signs taken at a resting state? Yes  ?Focused Assessment No change from prior assessment  ?Early Detection of Sepsis Score *See Row Information* Low  ?Treat  ?MEWS Interventions Administered prn meds/treatments;Escalated (See documentation below)  ?Pain Scale 0-10  ?Pain Score 0  ?Take Vital Signs  ?Increase Vital Sign Frequency  Red: Q 1hr X 4 then Q 4hr X 4, if remains red, continue Q 4hrs  ?Escalate  ?MEWS: Escalate Red: discuss with charge nurse/RN and provider, consider discussing with RRT  ?Notify: Charge Nurse/RN  ?Name of Charge Nurse/RN Notified Philomena,RN  ?Date Charge Nurse/RN Notified 05/03/21  ?Time Charge Nurse/RN Notified 0400  ?Notify: Provider  ?Provider Name/Title Dr. Marlowe Sax  ?Date Provider Notified 05/03/21  ?Time Provider Notified [phone]  ?Notification Type Page  ?Notification Reason Other (Comment) ?(temp 102.5 HR 113)  ?Provider response No new orders  ?Date of Provider Response 05/03/21  ?Time of Provider Response [phone]  ?Document  ?Patient Outcome Stabilized after interventions  ?Progress note created (see row info) Yes  ? ? ?

## 2021-05-03 NOTE — Progress Notes (Signed)
?PROGRESS NOTE ? ? ? ?Brittany [name]  WUJ:[id number] DOB: 1960-06-13 DOA: 04/05/2021 ?PCP: [facility]  ? ?No chief complaint on file. ? ? ?Brief Narrative:  ? ?61 year old lady with prior history significant for hypertension, diabetes, CVA with right-sided weakness, COPD, tobacco abuse, anxiety depression, lupus, hyperlipidemia, s/p pancreatectomy and splenectomy presented to ED on February 22 for confusion. ? EMS states they witnessed what could be a partial seizure, and gave the patient a total of 4 mg of intranasal Versed in route to the hospital. Patient was noted to have DKA, subclinical seizure on EEG and incidental finding of COVID-19 viral infection.  Neurology was consulted and patient was transferred to [facility] for continuous EEG.  Admitted to the hospital service.  Now DKA resolved.  Patient is off isolation of COVID-19.  Psychiatry consulted for agitation recommended Keppra should be changed to Depakote. ? ?Assessment & Plan: ?  ?Principal Problem: ?  New onset seizure ([place]) ?Active Problems: ?  Acute metabolic encephalopathy ?  Sepsis ([place]) ?  DKA (diabetic ketoacidosis) ([place]) ?  Type 2 diabetes mellitus with hyperlipidemia ([place]) ?  Essential hypertension ?  Hypokalemia; hyperkalemia ?  Restless leg syndrome ?  Elevated troponin ?  History of stroke ?  Hypomagnesemia ?  COVID-19 virus infection ?  Status post colostomy [facility]) ?  COPD, mild ([place]) ?  Protein-calorie malnutrition, severe ?  Pressure injury of skin ?  Behavior related to cognitive impairment ? ?New-onset seizures ?Probably secondary to lowered seizure threshold from DKA and COVID. ?Neurology consulted initially and transitioned Keppra to Depakote.  Patient currently on Depakote 500 mg twice daily. ?Neurology recommends outpatient follow-up on discharge. ? ? ?Sepsis probably secondary to aspiration pneumonia. ?Acute respiratory failure with hypoxia secondary to sedative medications ?Patient became more lethargic on  04/27/2021 with leukocytosis of 15.7, fever of 102 and heart rate of 128 with chest x-ray showing opacities in the right middle and lower lobes consistent with infiltrate.  Was also found to have a UTI. ?CT of the chest abdomen pelvis remarkable for a right lower lobe pulmonary consolidation consistent with pneumonia. ?Lactic acid normalized ?Blood cultures negative so far.  Patient currently on Zosyn to complete the course of antibiotics. ?Febrile overnight, with fever of 102.5. repeat blood cultures ordered.  ?ABg shows Ph of 7.5, po2 of 73% and pco2 of 37%.  ?Patient is on fluconazole to complete a course of 2 weeks. ? ? ? ? ?Acute metabolic encephalopathy ?Waxing and waning secondary to delirium and infection and medications.  ?Possibly aspiration event overnight.  ?Hadol dose decreased. Not safe for oral meds.  ?Plan for SNF when medically ready ? ? ?DKA ?Resolved ?Probably secondary to noncompliance to medications.CBG (last 3)  ?Recent Labs  ?  05/03/21 ?9562 05/03/21 ?1308 05/03/21 ?1108  ?GLUCAP 118* 140* 169*  ? ?Continue with SSI.  ? ? ? ?Type 2 diabetes mellitus with hyperlipidemia ?Secondary to noncompliance to medications. ?Patient on Semglee 22 units daily ?Continue with sliding scale insulin. ? ? ?Essential hypertension ?Blood pressure parameters are optimal. ? ? ?Hyperkalemia:  ?? Unclear etiology. Off ARB. ?From dehydration / constipation. EKG done and reviewed.  ? ? ? ?S/p colostomy:  ?Wound care consulted.  ? ? ?COPD:  ?No wheezing heard on exam.  ? ? ? ?Severe protein calorie malnutrition:  ?On supplementation.  ? ? ? Cognitive impairment  with behavioral abnormalities.  ?Currently on depakote 1250 mg at bedtime, along with clonazepam 0.25 mg BID, prn haldol, zyprexa .  ? ? ? ? ?  DVT prophylaxis: lovenox.  ?Code Status: (Full code) ?Family Communication: none at bedside.  ?Disposition:  ? ?Status is: Inpatient ?Remains inpatient appropriate because: IV antibiotics and restraints.  ?  ?Consultants:   ?Psychiatry.  ? ?Procedures:  ?CXR Improving interstitial opacities in the right lung. New mild airspace opacities in the left lower lobe, which may ?reflect atelectasis versus infection ? ?Antimicrobials: zosyn for aspiration pneumonia.  ? ?Subjective: ?Sleepy, but wakes up with sternal rub. She said she is awake.  ? ?Objective: ?Vitals:  ? 05/03/21 0445 05/03/21 0559 05/03/21 6761 05/03/21 0745  ?BP: 111/77 (!) '89/59 96/66 96/62 '$  ?Pulse: (!) 125 (!) 116 (!) 115 (!) 109  ?Resp: '18 18 18 18  '$ ?Temp: (!) 100.7 ?F (38.2 ?C) 99.4 ?F (37.4 ?C) 97.7 ?F (36.5 ?C) 98.5 ?F (36.9 ?C)  ?TempSrc: Axillary Oral Oral Oral  ?SpO2: 97% 92% 92% 92%  ?Weight:      ?Height:      ? ? ?Intake/Output Summary (Last 24 hours) at 05/03/2021 1148 ?Last data filed at 05/03/2021 0700 ?Gross per 24 hour  ?Intake 760 ml  ?Output 650 ml  ?Net 110 ml  ? ?Filed Weights  ? 04/30/21 0500 05/01/21 0304 05/02/21 9509  ?Weight: 44.6 kg 46.5 kg 43.9 kg  ? ? ?Examination: ? ?General exam: ill appearing somnolent lady, not in distress.  ?Respiratory system: Clear to auscultation. Respiratory effort normal. On 7 lit of  oxygen.  ?Cardiovascular system: S1 & S2 heard, RRR. No JVD, No pedal edema. ?Gastrointestinal system: Abdomen is nondistended, soft and nontender. S/p colostomy, Normal bowel sounds heard. ?Central nervous system: lethargic, sedated.  ?Extremities: Symmetric 5 x 5 power. ?Skin: No rashes, lesions or ulcers ?Psychiatry: Unable to assess.  ? ? ? ?Data Reviewed: I have personally reviewed following labs and imaging studies ? ?CBC: ?Recent Labs  ?Lab 04/29/21 ?3267 04/30/21 ?1245 05/01/21 ?0406 05/02/21 ?0115 05/03/21 ?8099  ?WBC 14.8* 20.0* 17.6* 10.4 16.9*  ?HGB 8.6* 9.7* 9.3* 9.3* 11.1*  ?HCT 27.6* 32.1* 28.9* 29.9* 34.1*  ?MCV 92.9 95.5 91.2 92.3 90.2  ?PLT 517* PLATELET CLUMPS NOTED ON SMEAR, UNABLE TO ESTIMATE 567* 570* 672*  ? ? ?Basic Metabolic Panel: ?Recent Labs  ?Lab 04/28/21 ?0451 04/29/21 ?8338 05/01/21 ?0406 05/02/21 ?0115  05/03/21 ?2505  ?NA 138 141 134* 134* 137  ?K 4.5 3.8 4.4 4.9 5.2*  ?CL 101 105 100 99 99  ?CO2 '28 29 26 26 24  '$ ?GLUCOSE 101* 193* 199* 262* 104*  ?BUN '21 12 16 18 '$ 28*  ?CREATININE 0.79 0.71 0.89 0.89 1.01*  ?CALCIUM 8.8* 8.3* 8.8* 8.8* 9.1  ?MG  --   --  2.1  --   --   ?PHOS  --   --  3.5  --   --   ? ? ?GFR: ?Estimated Creatinine Clearance: 40.5 mL/min (A) (by C-G formula based on SCr of 1.01 mg/dL (H)). ? ?Liver Function Tests: ?Recent Labs  ?Lab 04/27/21 ?0404 04/28/21 ?0451 04/29/21 ?3976 05/01/21 ?0406 05/02/21 ?0115  ?AST '26 22 21 17 19  '$ ?ALT '31 22 19 18 16  '$ ?ALKPHOS 106 90 100 94 102  ?BILITOT 0.3 0.3 0.2* 0.4 0.4  ?PROT 8.0 7.1 6.6 7.6 7.6  ?ALBUMIN 2.5* 2.1* 1.9* 1.9* 2.0*  ? ? ?CBG: ?Recent Labs  ?Lab 05/02/21 ?2132 05/02/21 ?2338 05/03/21 ?7341 05/03/21 ?9379 05/03/21 ?1108  ?GLUCAP 88 141* 118* 140* 169*  ? ? ? ?Recent Results (from the past 240 hour(s))  ?Culture, blood (Routine X 2) w Reflex to ID  Panel     Status: None  ? Collection Time: 04/27/21 10:36 AM  ? Specimen: BLOOD  ?Result Value Ref Range Status  ? Specimen Description BLOOD LEFT ANTECUBITAL  Final  ? Special Requests   Final  ?  BOTTLES DRAWN AEROBIC ONLY Blood Culture adequate volume  ? Culture   Final  ?  NO GROWTH 5 DAYS ?Performed at [facility], [place] [address] [place], [place] [address] ?  ? Report Status 05/02/2021 FINAL  Final  ?Culture, blood (Routine X 2) w Reflex to ID Panel     Status: None  ? Collection Time: 04/27/21 10:43 AM  ? Specimen: BLOOD LEFT ARM  ?Result Value Ref Range Status  ? Specimen Description BLOOD LEFT ARM  Final  ? Special Requests   Final  ?  BOTTLES DRAWN AEROBIC ONLY Blood Culture adequate volume  ? Culture   Final  ?  NO GROWTH 5 DAYS ?Performed at [facility], [place] [address], [place], [place] [address] ?  ? Report Status 05/02/2021 FINAL  Final  ?Urine Culture     Status: Abnormal  ? Collection Time: 04/27/21 11:24 AM  ? Specimen: In/Out Cath Urine  ?Result Value Ref Range Status  ?  Specimen Description IN/OUT CATH URINE  Final  ? Special Requests   Final  ?  NONE ?Performed at [facility], [place] [address] [place], [place] [address] ?  ? Culture 10,000 COLONIES/mL YEAST (A)  Final  ? Rep

## 2021-05-03 NOTE — Progress Notes (Signed)
?   05/03/21 1240  ?Assess: MEWS Score  ?Level of Consciousness Responds to Pain  ?SpO2 (!) 86 %  ?O2 Device HFNC  ?O2 Flow Rate (L/min) 10 L/min  ?Assess: MEWS Score  ?MEWS Temp 0  ?MEWS Systolic 1  ?MEWS Pulse 1  ?MEWS RR 0  ?MEWS LOC 2  ?MEWS Score 4  ?MEWS Score Color Red  ?Assess: if the MEWS score is Yellow or Red  ?Were vital signs taken at a resting state? Yes  ?Focused Assessment Change from prior assessment (see assessment flowsheet)  ?Early Detection of Sepsis Score *See Row Information* Medium  ?MEWS guidelines implemented *See Row Information* Yes  ?Treat  ?MEWS Interventions Escalated (See documentation below)  ?Escalate  ?MEWS: Escalate Red: discuss with charge nurse/RN and provider, consider discussing with RRT  ?Notify: Provider  ?Provider Name/Title Hosie Poisson, MD  ?Date Provider Notified 05/03/21  ?Time Provider Notified 1240  ?Notification Type Page  ?Notification Reason Change in status  ?Provider response En route  ?Date of Provider Response 05/03/21  ?Time of Provider Response 1245  ?Notify: Rapid Response  ?Name of Rapid Response RN Notified Kennis Carina, RN  ?Date Rapid Response Notified 05/03/21  ?Time Rapid Response Notified 1245  ? ? ?Rapid called due to patient status: patient is lethargic, and requiring higher amounts of oxygen.  Agricultural consultant notified. ?

## 2021-05-03 NOTE — Significant Event (Signed)
Rapid Response Event Note  ? ?Reason for Call :  ?Acute oxygen desaturation to 80% while pt on 7L HFNC- pt palced on 100% NRB ? ?Initial Focused Assessment:  ?Pt lying in bed, somnolent. Lung sounds are clear, diminished. Skin is warm, dry, pink. Abdomen is soft, bowel sounds are faint. Pt drowsily answers orientation questions appropriately following noxious stimuli. Pt moving all extremities. PERRLA, sluggish, 57m.  ? ?VS: T 97.20F, BP 117/98, HR 112, RR 20, SpO2 96% on 7L HFNC ? ?Interventions:  ?-ABG ordered by provider  7.5/37/73/28.9 ? ?Plan of Care:  ?-Continuous pulse oximetry ?-Avoid sedative medications ?-Aspiration precautions ? ?Call rapid response for additional needs ? ?Event Summary:  ?MD Notified: AKarleen Hampshire?Call Time: 1242 ?Arrival Time: 18295?End Time: 16213? ?SCasimer Bilis RN ?

## 2021-05-04 ENCOUNTER — Encounter: Payer: Medicare Other | Admitting: Physician Assistant

## 2021-05-04 LAB — BASIC METABOLIC PANEL
Anion gap: 12 (ref 5–15)
BUN: 36 mg/dL — ABNORMAL HIGH (ref 8–23)
CO2: 22 mmol/L (ref 22–32)
Calcium: 8.8 mg/dL — ABNORMAL LOW (ref 8.9–10.3)
Chloride: 103 mmol/L (ref 98–111)
Creatinine, Ser: 1.13 mg/dL — ABNORMAL HIGH (ref 0.44–1.00)
GFR, Estimated: 55 mL/min — ABNORMAL LOW (ref >60)
Glucose, Bld: 149 mg/dL — ABNORMAL HIGH (ref 70–99)
Potassium: 4.8 mmol/L (ref 3.5–5.1)
Sodium: 137 mmol/L (ref 135–145)

## 2021-05-04 LAB — CBC
HCT: 38.2 % (ref 36.0–46.0)
Hemoglobin: 11.5 g/dL — ABNORMAL LOW (ref 12.0–15.0)
MCH: 28.2 pg (ref 26.0–34.0)
MCHC: 30.1 g/dL (ref 30.0–36.0)
MCV: 93.6 fL (ref 80.0–100.0)
Platelets: 685 10*3/uL — ABNORMAL HIGH (ref 150–400)
RBC: 4.08 MIL/uL (ref 3.87–5.11)
RDW: 15.7 % — ABNORMAL HIGH (ref 11.5–15.5)
WBC: 19.9 10*3/uL — ABNORMAL HIGH (ref 4.0–10.5)
nRBC: 0.3 % — ABNORMAL HIGH (ref 0.0–0.2)

## 2021-05-04 LAB — GLUCOSE, CAPILLARY
Glucose-Capillary: 102 mg/dL — ABNORMAL HIGH (ref 70–99)
Glucose-Capillary: 93 mg/dL (ref 70–99)
Glucose-Capillary: 209 mg/dL — ABNORMAL HIGH (ref 70–99)
Glucose-Capillary: 279 mg/dL — ABNORMAL HIGH (ref 70–99)
Glucose-Capillary: 451 mg/dL — ABNORMAL HIGH (ref 70–99)
Glucose-Capillary: 395 mg/dL — ABNORMAL HIGH (ref 70–99)

## 2021-05-04 NOTE — Progress Notes (Signed)
Patient is more alert, awake and conversant now. Can follow commands. Put on high back rest and was able to take her medicine whole. No cough noted during medication administration. ?

## 2021-05-04 NOTE — Progress Notes (Signed)
?PROGRESS NOTE ? ? ? ?Brittany [name]  AYT:[id number] DOB: 1960-08-08 DOA: 04/05/2021 ?PCP: [facility]  ? ?No chief complaint on file. ? ? ?Brief Narrative:  ? ?61 year old lady with prior history significant for hypertension, diabetes, CVA with right-sided weakness, COPD, tobacco abuse, anxiety depression, lupus, hyperlipidemia, s/p pancreatectomy and splenectomy presented to ED on February 22 for confusion. ? EMS states they witnessed what could be a partial seizure, and gave the patient a total of 4 mg of intranasal Versed in route to the hospital. Patient was noted to have DKA, subclinical seizure on EEG and incidental finding of COVID-19 viral infection.  Neurology was consulted and patient was transferred to [facility] for continuous EEG.  Admitted to the hospital service.  Now DKA resolved.  Patient is off isolation of COVID-19.  Psychiatry consulted for agitation recommended Keppra should be changed to Depakote. ? ?Assessment & Plan: ?  ?Principal Problem: ?  New onset seizure ([place]) ?Active Problems: ?  Acute metabolic encephalopathy ?  Sepsis ([place]) ?  DKA (diabetic ketoacidosis) ([place]) ?  Type 2 diabetes mellitus with hyperlipidemia ([place]) ?  Essential hypertension ?  Hypokalemia; hyperkalemia ?  Restless leg syndrome ?  Elevated troponin ?  History of stroke ?  Hypomagnesemia ?  COVID-19 virus infection ?  Status post colostomy [facility]) ?  COPD, mild ([place]) ?  Protein-calorie malnutrition, severe ?  Pressure injury of skin ?  Behavior related to cognitive impairment ? ?New-onset seizures ?Probably secondary to lowered seizure threshold from DKA and COVID. ?Neurology consulted initially and transitioned Keppra to Depakote.  Patient currently on Depakote 500 mg twice daily. ?Neurology recommends outpatient follow-up on discharge. ? ? ?Sepsis probably secondary to aspiration pneumonia. ?Acute respiratory failure with hypoxia secondary to sedative medications ?Patient became more lethargic on  04/27/2021 with leukocytosis of 15.7, fever of 102 and heart rate of 128 with chest x-ray showing opacities in the right middle and lower lobes consistent with infiltrate.  Was also found to have a UTI. ?CT of the chest abdomen pelvis remarkable for a right lower lobe pulmonary consolidation consistent with pneumonia. ?Lactic acid normalized ?Blood cultures negative so far.  Patient currently on Zosyn to complete the course of antibiotics. ?She is more alert today and answering questions.  ?She is weaned off oxygen today.  ?Afebrile overnight, but with worsening leukocytosis. Continue to monitor.  ? ? ?Acute metabolic encephalopathy ?Waxing and waning secondary to delirium and infection and medications.  ?Possibly aspiration event overnight. More alert this am and answering questions appropriately.  ?Hadol dose decreased. Not safe for oral meds.  ?Plan for SNF when medically ready ? ? ?DKA ?Resolved ?Probably secondary to noncompliance to medications.CBG (last 3)  ?Recent Labs  ?  05/04/21 ?3557 05/04/21 ?3220 05/04/21 ?1225  ?GLUCAP 102* 93 209*  ? ? ?Continue with SSI.  ? ? ? ?Type 2 diabetes mellitus with hyperlipidemia ?Secondary to noncompliance to medications. ?Patient on Semglee 22 units daily and SSI.  ? ? ?Essential hypertension ?Blood pressure parameters are well controlled.  ? ? ?Hyperkalemia:  ?Resolved.  ? ? ? ?S/p colostomy:  ?Wound care consulted.  ? ? ?COPD:  on RA Today.  ?No wheezing heard on exam.  ? ? ? ?Severe protein calorie malnutrition:  ?On supplementation.  ? ? ? Cognitive impairment  with behavioral abnormalities.  ?Currently on depakote 1250 mg at bedtime, along with clonazepam 0.25 mg BID, prn haldol, zyprexa .  ? ? ? ? ?DVT prophylaxis: lovenox.  ?Code  Status: (Full code) ?Family Communication: none at bedside.  ?Disposition:  ? ?Status is: Inpatient ?Remains inpatient appropriate because: IV antibiotics and restraints.  ?  ?Consultants:  ?Psychiatry.  ? ?Procedures:  ?CXR Improving  interstitial opacities in the right lung. New mild airspace opacities in the left lower lobe, which may ?reflect atelectasis versus infection ? ?Antimicrobials: zosyn for aspiration pneumonia.  ? ?Subjective: ?Alert and answering questions appropriately.  ?No chest pain or sob.  ? ?Objective: ?Vitals:  ? 05/03/21 1914 05/03/21 2330 05/04/21 0314 05/04/21 0700  ?BP: (!) 131/98 128/67 (!) 123/95 (P) 109/85  ?Pulse: (!) 108 98 94 (P) 97  ?Resp: '17 17 15   '$ ?Temp: 98.2 ?F (36.8 ?C) 98.3 ?F (36.8 ?C) 97.9 ?F (36.6 ?C) (P) 97.6 ?F (36.4 ?C)  ?TempSrc: Oral Oral Oral (P) Axillary  ?SpO2: 98% 100% 98% (P) 97%  ?Weight:      ?Height:      ? ? ?Intake/Output Summary (Last 24 hours) at 05/04/2021 1231 ?Last data filed at 05/04/2021 1026 ?Gross per 24 hour  ?Intake 690.06 ml  ?Output 150 ml  ?Net 540.06 ml  ? ? ?Filed Weights  ? 04/30/21 0500 05/01/21 0304 05/02/21 6378  ?Weight: 44.6 kg 46.5 kg 43.9 kg  ? ? ?Examination: ?General exam: ill appearing lady, not in distress. On RA ?Respiratory system:  Decreased air entry at bases, no wheezing heard, on RA.  ?Cardiovascular system: S1 & S2 heard, RRR. No JVD,  No pedal edema. ?Gastrointestinal system: Abdomen is nondistended, soft and nontender. Normal bowel sounds heard. ?Central nervous system: Alert and oriented to person only. Able to  move all extremities.  ?Extremities: no pedal edema.  ?Skin: s/p colostomy.  ?Psychiatry: mood is appropriate.  ? ? ? ? ?Data Reviewed: I have personally reviewed following labs and imaging studies ? ?CBC: ?Recent Labs  ?Lab 04/30/21 ?0614 05/01/21 ?0406 05/02/21 ?0115 05/03/21 ?5885 05/04/21 ?0940  ?WBC 20.0* 17.6* 10.4 16.9* 19.9*  ?HGB 9.7* 9.3* 9.3* 11.1* 11.5*  ?HCT 32.1* 28.9* 29.9* 34.1* 38.2  ?MCV 95.5 91.2 92.3 90.2 93.6  ?PLT PLATELET CLUMPS NOTED ON SMEAR, UNABLE TO ESTIMATE 567* 570* 672* 685*  ? ? ? ?Basic Metabolic Panel: ?Recent Labs  ?Lab 04/29/21 ?0277 05/01/21 ?0406 05/02/21 ?0115 05/03/21 ?4128 05/04/21 ?0940  ?NA 141 134* 134*  137 137  ?K 3.8 4.4 4.9 5.2* 4.8  ?CL 105 100 99 99 103  ?CO2 '29 26 26 24 22  '$ ?GLUCOSE 193* 199* 262* 104* 149*  ?BUN '12 16 18 '$ 28* 36*  ?CREATININE 0.71 0.89 0.89 1.01* 1.13*  ?CALCIUM 8.3* 8.8* 8.8* 9.1 8.8*  ?MG  --  2.1  --   --   --   ?PHOS  --  3.5  --   --   --   ? ? ? ?GFR: ?Estimated Creatinine Clearance: 36.2 mL/min (A) (by C-G formula based on SCr of 1.13 mg/dL (H)). ? ?Liver Function Tests: ?Recent Labs  ?Lab 04/28/21 ?0451 04/29/21 ?7867 05/01/21 ?0406 05/02/21 ?0115  ?AST '22 21 17 19  '$ ?ALT '22 19 18 16  '$ ?ALKPHOS 90 100 94 102  ?BILITOT 0.3 0.2* 0.4 0.4  ?PROT 7.1 6.6 7.6 7.6  ?ALBUMIN 2.1* 1.9* 1.9* 2.0*  ? ? ? ?CBG: ?Recent Labs  ?Lab 05/03/21 ?1611 05/03/21 ?2104 05/04/21 ?6720 05/04/21 ?9470 05/04/21 ?1225  ?GLUCAP 135* 241* 102* 93 209*  ? ? ? ? ?Recent Results (from the past 240 hour(s))  ?Culture, blood (Routine X 2) w Reflex to ID Panel  Status: None  ? Collection Time: 04/27/21 10:36 AM  ? Specimen: BLOOD  ?Result Value Ref Range Status  ? Specimen Description BLOOD LEFT ANTECUBITAL  Final  ? Special Requests   Final  ?  BOTTLES DRAWN AEROBIC ONLY Blood Culture adequate volume  ? Culture   Final  ?  NO GROWTH 5 DAYS ?Performed at [facility], [place] [address], [place], [place] [address] ?  ? Report Status 05/02/2021 FINAL  Final  ?Culture, blood (Routine X 2) w Reflex to ID Panel     Status: None  ? Collection Time: 04/27/21 10:43 AM  ? Specimen: BLOOD LEFT ARM  ?Result Value Ref Range Status  ? Specimen Description BLOOD LEFT ARM  Final  ? Special Requests   Final  ?  BOTTLES DRAWN AEROBIC ONLY Blood Culture adequate volume  ? Culture   Final  ?  NO GROWTH 5 DAYS ?Performed at [facility], [place] [address], [place], [place] [address] ?  ? Report Status 05/02/2021 FINAL  Final  ?Urine Culture     Status: Abnormal  ? Collection Time: 04/27/21 11:24 AM  ? Specimen: In/Out Cath Urine  ?Result Value Ref Range Status  ? Specimen Description IN/OUT CATH URINE  Final  ? Special Requests    Final  ?  NONE ?Performed at [facility], [place] [address] [place],  [address] ?  ? Culture 10,000 COLONIES/mL YEAST (A)  Final  ? Report Status 04/28/2021 FINAL  Final  ?MRSA Next Gen by PCR, Nas

## 2021-05-04 NOTE — Progress Notes (Signed)
Physical Therapy Treatment ?Patient Details ?Name: Brittany [name] ?MRN: [id number] ?DOB: 05/08/1960 ?Today's Date: 05/04/2021 ? ? ?History of Present Illness Lyndell Gillyard is a 89yoF who was brought to the ED for confusion plus DKA.  EEG noted subclinical seizure.  Also found to have incidental COVID. 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 Pt admitted with diverticulitis of intestine with abscess. ? ?  ?PT Comments  ? ? Pt needing undergarment change on arrival. Worked on safety with sit to stand and standing stability during peri care.  Emphasis otherwise on gait stability and stamina with cues/assist to improve gait quality and amount of drift in the RW. ?   ?Recommendations for follow up therapy are one component of a multi-disciplinary discharge planning process, led by the attending physician.  Recommendations may be updated based on patient status, additional functional criteria and insurance authorization. ? ?Follow Up Recommendations ? Skilled nursing-short term rehab (<3 hours/day) ?  ?  ?Assistance Recommended at Discharge Frequent or constant Supervision/Assistance  ?Patient can return home with the following Assistance with cooking/housework;Direct supervision/assist for medications management;Help with stairs or ramp for entrance;Assist for transportation;A little help with walking and/or transfers;A little help with bathing/dressing/bathroom ?  ?Equipment Recommendations ? None recommended by PT  ?  ?Recommendations for Other Services   ? ? ?  ?Precautions / Restrictions Precautions ?Precautions: Fall ?Precaution Comments: seizure, urinary incontinence, colostomy bag frequently comes off  ?  ? ?Mobility ? Bed Mobility ?  ?  ?  ?  ?  ?  ?  ?General bed mobility comments: OOB on arrival ?  ? ?Transfers ?Overall transfer level: Needs assistance ?Equipment used: Rolling walker (2  wheels) ?Transfers: Sit to/from Stand ?Sit to Stand: Min assist ?  ?  ?  ?  ?  ?General transfer comment: cues for hand placement ascent and descent ?  ? ?Ambulation/Gait ?Ambulation/Gait assistance: Min assist ?Gait Distance (Feet): 300 Feet ?Assistive device: Rolling walker (2 wheels) ?Gait Pattern/deviations: Step-through pattern ?Gait velocity: decr ?Gait velocity interpretation: <1.8 ft/sec, indicate of risk for recurrent falls ?  ?General Gait Details: mildly unsteady gait with narrowed BOS, drifting R, mild stagger/scissoring.  slower cadence in general.  Pt needing stability and RW maneuver assist. ? ? ?Stairs ?  ?  ?  ?  ?  ? ? ?Wheelchair Mobility ?  ? ?Modified Rankin (Stroke Patients Only) ?  ? ? ?  ?Balance Overall balance assessment: Needs assistance ?  ?Sitting balance-Leahy Scale: Fair ?  ?  ?  ?Standing balance-Leahy Scale: Poor ?Standing balance comment: reliant on AD or exteernal support. ?  ?  ?  ?  ?  ?  ?  ?  ?  ?  ?  ?  ? ?  ?Cognition Arousal/Alertness: Awake/alert ?Behavior During Therapy: Flat affect ?Overall Cognitive Status: No family/caregiver present to determine baseline cognitive functioning (NT formally) ?  ?  ?  ?  ?  ?  ?  ?  ?  ?  ?  ?  ?Following Commands: Follows one step commands with increased time, Follows one step commands consistently ?  ?  ?Problem Solving: Slow processing ?  ?  ?  ? ?  ?Exercises   ? ?  ?General Comments   ?  ?  ? ?Pertinent Vitals/Pain Pain Assessment ?Pain Assessment: Faces ?Faces Pain Scale: No hurt ?Pain Intervention(s): Monitored during session  ? ? ?  Home Living   ?  ?  ?  ?  ?  ?  ?  ?  ?  ?   ?  ?Prior Function    ?  ?  ?   ? ?PT Goals (current goals can now be found in the care plan section) Acute Rehab PT Goals ?PT Goal Formulation: Patient unable to participate in goal setting ?Time For Goal Achievement: 05/15/21 ?Potential to Achieve Goals: Fair ?Progress towards PT goals: Progressing toward goals ? ?  ?Frequency ? ? ? Min 2X/week ? ? ? ?  ?PT  Plan Current plan remains appropriate  ? ? ?Co-evaluation   ?  ?  ?  ?  ? ?  ?AM-PAC PT "6 Clicks" Mobility   ?Outcome Measure ? Help needed turning from your back to your side while in a flat bed without using bedrails?: A Little ?Help needed moving from lying on your back to sitting on the side of a flat bed without using bedrails?: A Little ?Help needed moving to and from a bed to a chair (including a wheelchair)?: A Little ?Help needed standing up from a chair using your arms (e.g., wheelchair or bedside chair)?: A Little ?Help needed to walk in hospital room?: A Little ?Help needed climbing 3-5 steps with a railing? : A Lot ?6 Click Score: 17 ? ?  ?End of Session   ?Activity Tolerance: Patient tolerated treatment well ?Patient left: in chair;with call bell/phone within reach;with nursing/sitter in room ?Nurse Communication: Mobility status ?PT Visit Diagnosis: Unsteadiness on feet (R26.81);Muscle weakness (generalized) (M62.81);Difficulty in walking, not elsewhere classified (R26.2) ?  ? ? ?Time: 3299-2426 ?PT Time Calculation (min) (ACUTE ONLY): 22 min ? ?Charges:  $Gait Training: 8-22 mins          ?          ? ?05/04/2021 ? ?Ginger Carne., PT ?Acute Rehabilitation Services ?[phone]  (pager) ?[phone]  (office) ? ? ?Tessie Fass Dontavius Keim ?05/04/2021, 6:04 PM ? ?

## 2021-05-04 NOTE — Progress Notes (Signed)
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 Pt not in restraints at all during my shift thus far.  ?

## 2021-05-04 NOTE — Plan of Care (Signed)
Patient is still confused but didn't try to pull her IV lines and not agitated during the shift.  ?Problem: Health Behavior/Discharge Planning: ?Goal: Ability to manage health-related needs will improve ?Outcome: Progressing ?  ?Problem: Safety: ?Goal: Ability to remain free from injury will improve ?Outcome: Progressing ?  ?

## 2021-05-05 LAB — GLUCOSE, CAPILLARY
Glucose-Capillary: 237 mg/dL — ABNORMAL HIGH (ref 70–99)
Glucose-Capillary: 102 mg/dL — ABNORMAL HIGH (ref 70–99)
Glucose-Capillary: 345 mg/dL — ABNORMAL HIGH (ref 70–99)
Glucose-Capillary: 124 mg/dL — ABNORMAL HIGH (ref 70–99)
Glucose-Capillary: 51 mg/dL — ABNORMAL LOW (ref 70–99)

## 2021-05-05 MED ORDER — INSULIN ASPART 100 UNIT/ML IJ SOLN
5.0000 [IU] | Freq: Three times a day (TID) | INTRAMUSCULAR | Status: DC
  Administered 2021-05-05 – 2021-05-06 (×4): 5 [IU] via SUBCUTANEOUS

## 2021-05-05 NOTE — Progress Notes (Signed)
Inpatient Diabetes Program Recommendations ? ?AACE/ADA: New Consensus Statement on Inpatient Glycemic Control  ? ?Target Ranges:  Prepandial:   less than 140 mg/dL ?     Peak postprandial:   less than 180 mg/dL (1-2 hours) ?     Critically ill patients:  140 - 180 mg/dL  ? ? Latest Reference Range & Units 05/04/21 08:14 05/04/21 12:25 05/04/21 15:57 05/04/21 21:08 05/04/21 22:12 05/05/21 06:43  ?Glucose-Capillary 70 - 99 mg/dL 93 209 (H) 279 (H) 451 (H) 395 (H) 237 (H)  ? ?Review of Glycemic Control ? ?Outpatient Diabetes medications: 70/30 28 units BID (per med list taking 30 units with breakfast) ?Current orders for Inpatient glycemic control: Semglee 22 units daily, Novolog 4 units TID with meals, Novolog 0-9 units TID with meals, Novolog 0-5 units QHS ? ?Inpatient Diabetes Program Recommendations:   ? ?Insulin: May want to consider increasing meal coverage to Novolog 6 units TID with meals. ? ?Supplements: If supplements are still needed, may want to consult RD to see if it would be beneficial to switch supplements from Glucerna to Ensure Max since Ensure Max has less carbohydrates. ? ?Thanks, ?Barnie Alderman, RN, MSN, CDE ?Diabetes Coordinator ?Inpatient Diabetes Program ?[phone] (Team Pager from 8am to 5pm) ? ?

## 2021-05-05 NOTE — Plan of Care (Signed)
  Problem: Safety: Goal: Ability to remain free from injury will improve Outcome: Progressing   

## 2021-05-05 NOTE — Progress Notes (Signed)
2108H blood sugar was 451. Didn't notice that it was taken early and relayed to me almost 2200H. Did a repeat blood sugar at 2212 before treating it because it is almost an hour ago and result was 395, treated it with her sliding scale coverage. Messaged the night MD for just an FYI for the misunderstanding. ?

## 2021-05-05 NOTE — Progress Notes (Signed)
Patient not wearing restraints upon assessment. Sitter at bedside. Patient resting comfortably.  ?

## 2021-05-05 NOTE — Progress Notes (Signed)
Occupational Therapy Treatment ?Patient Details ?Name: Brittany [name] ?MRN: [id number] ?DOB: 1960/07/22 ?Today's Date: 05/05/2021 ? ? ?History of present illness Brittany [name] is a 50yoF who was brought to the ED for confusion plus DKA.  EEG noted subclinical seizure.  Also found to have incidental COVID. 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 Pt admitted with diverticulitis of intestine with abscess. ?  ?OT comments ? Pt making good progress with OT goals. This session she presented with increased activity tolerance, strength, and balance. Pt motivated to ambulate and requested to clean up at the sink when she was done. Pt requiring min guard to min A throughout session due to balance. Continuing to recommend SNF and OT will follow acutely.  ? ?Recommendations for follow up therapy are one component of a multi-disciplinary discharge planning process, led by the attending physician.  Recommendations may be updated based on patient status, additional functional criteria and insurance authorization. ?   ?Follow Up Recommendations ? Skilled nursing-short term rehab (<3 hours/day)  ?  ?Assistance Recommended at Discharge Frequent or constant Supervision/Assistance  ?Patient can return home with the following ? A little help with walking and/or transfers;A little help with bathing/dressing/bathroom;Assistance with cooking/housework;Assistance with feeding;Direct supervision/assist for medications management;Direct supervision/assist for financial management;Assist for transportation;Help with stairs or ramp for entrance ?  ?Equipment Recommendations ? None recommended by OT  ?  ?Recommendations for Other Services   ? ?  ?Precautions / Restrictions Precautions ?Precautions: Fall ?Precaution Comments: seizure, urinary incontinence, colostomy bag frequently comes off ?Restrictions ?Weight Bearing  Restrictions: No  ? ? ?  ? ?Mobility Bed Mobility ?Overal bed mobility: Modified Independent ?  ?  ?  ?  ?  ?  ?General bed mobility comments: no difficulty coming to sit or return to supine ?  ? ?Transfers ?Overall transfer level: Needs assistance ?Equipment used: Rolling walker (2 wheels) ?Transfers: Sit to/from Stand ?Sit to Stand: Min assist ?  ?  ?  ?  ?  ?General transfer comment: Min A to power up and steady ?  ?  ?Balance Overall balance assessment: Needs assistance ?Sitting-balance support: No upper extremity supported, Feet supported ?Sitting balance-Leahy Scale: Good ?  ?  ?Standing balance support: Bilateral upper extremity supported, During functional activity ?Standing balance-Leahy Scale: Poor ?Standing balance comment: reliant on AD or exteernal support. ?  ?  ?  ?  ?  ?  ?  ?  ?  ?  ?  ?   ? ?ADL either performed or assessed with clinical judgement  ? ?ADL Overall ADL's : Needs assistance/impaired ?  ?  ?Grooming: Wash/dry hands;Wash/dry face;Applying deodorant;Min guard;Standing ?Grooming Details (indicate cue type and reason): completed at sink ?  ?  ?  ?  ?  ?  ?  ?  ?Toilet Transfer: Min guard;Ambulation ?Toilet Transfer Details (indicate cue type and reason): able to travel long distance ?Toileting- Water quality scientist and Hygiene: Min guard;Sit to/from stand ?Toileting - Clothing Manipulation Details (indicate cue type and reason): completed in bathroom ?  ?  ?Functional mobility during ADLs: Min guard;Rolling walker (2 wheels) ?General ADL Comments: Increased activity tolerance this sesion ?  ? ?Extremity/Trunk Assessment   ?  ?  ?  ?  ?  ? ?Vision   ?  ?  ?Perception   ?  ?Praxis   ?  ? ?Cognition Arousal/Alertness: Awake/alert ?Behavior During Therapy:  Flat affect ?Overall Cognitive Status: No family/caregiver present to determine baseline cognitive functioning ?  ?  ?  ?  ?  ?  ?  ?  ?  ?  ?  ?  ?  ?  ?  ?  ?General Comments: Slow processing, hands on support, multimodal cuing ?  ?  ?    ?Exercises   ? ?  ?Shoulder Instructions   ? ? ?  ?General Comments VSS on RA  ? ? ?Pertinent Vitals/ Pain       Pain Assessment ?Pain Assessment: No/denies pain ? ?Home Living   ?  ?  ?  ?  ?  ?  ?  ?  ?  ?  ?  ?  ?  ?  ?  ?  ?  ?  ? ?  ?Prior Functioning/Environment    ?  ?  ?  ?   ? ?Frequency ? Min 2X/week  ? ? ? ? ?  ?Progress Toward Goals ? ?OT Goals(current goals can now be found in the care plan section) ? Progress towards OT goals: Progressing toward goals ? ?Acute Rehab OT Goals ?Patient Stated Goal: To go home ?OT Goal Formulation: With patient ?Time For Goal Achievement: 05/17/21 ?Potential to Achieve Goals: Good ?ADL Goals ?Pt Will Perform Upper Body Dressing: Independently;sitting ?Pt Will Perform Lower Body Dressing: with modified independence;sit to/from stand ?Pt Will Transfer to Toilet: with modified independence;ambulating ?Additional ADL Goal #1: pt will indep follow 3 step directional task as a precursor to safe ADLs  ?Plan Discharge plan remains appropriate   ? ?Co-evaluation ? ? ?   ?  ?  ?  ?  ? ?  ?AM-PAC OT "6 Clicks" Daily Activity     ?Outcome Measure ? ? Help from another person eating meals?: A Little ?Help from another person taking care of personal grooming?: A Little ?Help from another person toileting, which includes using toliet, bedpan, or urinal?: A Little ?Help from another person bathing (including washing, rinsing, drying)?: A Little ?Help from another person to put on and taking off regular upper body clothing?: A Little ?Help from another person to put on and taking off regular lower body clothing?: A Little ?6 Click Score: 18 ? ?  ?End of Session Equipment Utilized During Treatment: Rolling walker (2 wheels) ? ?OT Visit Diagnosis: Unsteadiness on feet (R26.81);Other abnormalities of gait and mobility (R26.89);Muscle weakness (generalized) (M62.81) ?  ?Activity Tolerance Patient tolerated treatment well ?  ?Patient Left in bed;with call bell/phone within reach;with  nursing/sitter in room ?  ?Nurse Communication Mobility status ?  ? ?   ? ?Time: 2376-2831 ?OT Time Calculation (min): 13 min ? ?Charges: OT General Charges ?$OT Visit: 1 Visit ?OT Treatments ?$Therapeutic Activity: 8-22 mins ? ?Magaret Justo H., OTR/L ?Acute Rehabilitation ? ?Magnus Crescenzo Elane Yolanda Bonine ?05/05/2021, 6:26 PM ?

## 2021-05-05 NOTE — Progress Notes (Signed)
?PROGRESS NOTE ? ? ? ?Brittany [name]  KDT:[id number] DOB: 1960-10-30 DOA: 04/05/2021 ?PCP: [facility]  ? ?No chief complaint on file. ? ? ?Brief Narrative:  ? ?61 year old lady with prior history significant for hypertension, diabetes, CVA with right-sided weakness, COPD, tobacco abuse, anxiety depression, lupus, hyperlipidemia, s/p pancreatectomy and splenectomy presented to ED on February 22 for confusion. ? EMS states they witnessed what could be a partial seizure, and gave the patient a total of 4 mg of intranasal Versed in route to the hospital. Patient was noted to have DKA, subclinical seizure on EEG and incidental finding of COVID-19 viral infection.  Neurology was consulted and patient was transferred to [facility] for continuous EEG.  Admitted to the hospital service.  Now DKA resolved.  Patient is off isolation of COVID-19.  Psychiatry consulted for agitation recommended Keppra should be changed to Depakote. ? ?Assessment & Plan: ?  ?Principal Problem: ?  New onset seizure ([place]) ?Active Problems: ?  Acute metabolic encephalopathy ?  Sepsis ([place]) ?  DKA (diabetic ketoacidosis) ([place]) ?  Type 2 diabetes mellitus with hyperlipidemia ([place]) ?  Essential hypertension ?  Hypokalemia; hyperkalemia ?  Restless leg syndrome ?  Elevated troponin ?  History of stroke ?  Hypomagnesemia ?  COVID-19 virus infection ?  Status post colostomy [facility]) ?  COPD, mild ([place]) ?  Protein-calorie malnutrition, severe ?  Pressure injury of skin ?  Behavior related to cognitive impairment ? ?New-onset seizures ?Probably secondary to lowered seizure threshold from DKA and COVID. ?Neurology consulted initially and transitioned Keppra to Depakote.  Patient currently on Depakote 500 mg twice daily. ?Neurology recommends outpatient follow-up on discharge. ? ? ?Sepsis probably secondary to aspiration pneumonia. ?Acute respiratory failure with hypoxia secondary to sedative medications ?Patient became more lethargic on  04/27/2021 with leukocytosis of 15.7, fever of 102 and heart rate of 128 with chest x-ray showing opacities in the right middle and lower lobes consistent with infiltrate.  Was also found to have a UTI. ?CT of the chest abdomen pelvis remarkable for a right lower lobe pulmonary consolidation consistent with pneumonia. ?Lactic acid normalized ?Blood cultures negative so far.  Completed the course of IV antibiotics.  ?She is more alert today and answering questions.  ?She is weaned off oxygen today.  ?Afebrile overnight, but with worsening leukocytosis. Check CBC today.  ? ? ?Acute metabolic encephalopathy ?Waxing and waning secondary to delirium and infection and medications.  ?Completed the course of antibiotics.  ?Still requiring sitter at bedside to orient the patient and for safety.  ?SNF when we can safely d/c sitter.  ? ? ?DKA ?Resolved ?Probably secondary to noncompliance to medications.CBG (last 3)  ?Recent Labs  ?  05/04/21 ?2108 05/04/21 ?2212 05/05/21 ?0643  ?GLUCAP 451* 395* 237*  ? ?Uncontrolled. Will adjust meds.  ? ? ? ?Type 2 diabetes mellitus with hyperlipidemia ?Secondary to noncompliance to medications. ?Patient on Semglee 22 units daily and SSI. Increase novolog to 5 units TIDAC.  ? ? ?Essential hypertension ?Blood pressure parameters are OPTIMAL.  ? ? ?Hyperkalemia:  ?Resolved.  ? ? ? ?S/p colostomy:  ?Wound care consulted.  ? ? ?COPD:  on RA Today.  ?No wheezing heard on exam.  ? ? ? ?Severe protein calorie malnutrition:  ?On supplementation.  ? ? ? Cognitive impairment  with behavioral abnormalities.  ?Currently on depakote 1250 mg at bedtime, along with clonazepam 0.25 mg BID, prn haldol, zyprexa .  ? ? ? ? ?DVT prophylaxis: lovenox.  ?  Code Status: (Full code) ?Family Communication: none at bedside.  ?Disposition:  ? ?Status is: Inpatient ?Remains inpatient appropriate because: to SNF when we can safely d/c sitter. Off restraints.  ?  ?Consultants:  ?Psychiatry.  ? ?Procedures:  ?CXR Improving  interstitial opacities in the right lung. New mild airspace opacities in the left lower lobe, which may ?reflect atelectasis versus infection ? ?Antimicrobials: zosyn for aspiration pneumonia.  ? ?Subjective: ?Wants to go home. No new complaints.  ? ?Objective: ?Vitals:  ? 05/04/21 2110 05/05/21 0005 05/05/21 0342 05/05/21 0800  ?BP: 98/72 110/76 105/76 104/76  ?Pulse: 95 94 84 92  ?Resp: '18 16 16 18  '$ ?Temp: 97.6 ?F (36.4 ?C) 97.6 ?F (36.4 ?C) 97.7 ?F (36.5 ?C) 98.6 ?F (37 ?C)  ?TempSrc: Oral Oral Oral Oral  ?SpO2: 93% 95% 97% 97%  ?Weight:      ?Height:      ? ? ?Intake/Output Summary (Last 24 hours) at 05/05/2021 1042 ?Last data filed at 05/05/2021 0755 ?Gross per 24 hour  ?Intake 820 ml  ?Output 610 ml  ?Net 210 ml  ? ? ?Filed Weights  ? 04/30/21 0500 05/01/21 0304 05/02/21 2426  ?Weight: 44.6 kg 46.5 kg 43.9 kg  ? ? ?Examination: ?General exam: Appears calm and comfortable  ?Respiratory system: Clear to auscultation. Respiratory effort normal. ?Cardiovascular system: S1 & S2 heard, RRR. No JVD, No pedal edema. ?Gastrointestinal system: Abdomen is nondistended, soft and nontender.  Normal bowel sounds heard. ?Central nervous system: Alert and oriented to place only ?Extremities:no pedal edema.  ?Skin: No rashes, lesions or ulcers ?Psychiatry: mood appropriate. But sitter reports pt is still trying to hit her earlier today.  ? ? ? ? ? ? ?Data Reviewed: I have personally reviewed following labs and imaging studies ? ?CBC: ?Recent Labs  ?Lab 04/30/21 ?0614 05/01/21 ?0406 05/02/21 ?0115 05/03/21 ?8341 05/04/21 ?0940  ?WBC 20.0* 17.6* 10.4 16.9* 19.9*  ?HGB 9.7* 9.3* 9.3* 11.1* 11.5*  ?HCT 32.1* 28.9* 29.9* 34.1* 38.2  ?MCV 95.5 91.2 92.3 90.2 93.6  ?PLT PLATELET CLUMPS NOTED ON SMEAR, UNABLE TO ESTIMATE 567* 570* 672* 685*  ? ? ? ?Basic Metabolic Panel: ?Recent Labs  ?Lab 04/29/21 ?9622 05/01/21 ?0406 05/02/21 ?0115 05/03/21 ?2979 05/04/21 ?0940  ?NA 141 134* 134* 137 137  ?K 3.8 4.4 4.9 5.2* 4.8  ?CL 105 100 99 99 103   ?CO2 '29 26 26 24 22  '$ ?GLUCOSE 193* 199* 262* 104* 149*  ?BUN '12 16 18 '$ 28* 36*  ?CREATININE 0.71 0.89 0.89 1.01* 1.13*  ?CALCIUM 8.3* 8.8* 8.8* 9.1 8.8*  ?MG  --  2.1  --   --   --   ?PHOS  --  3.5  --   --   --   ? ? ? ?GFR: ?Estimated Creatinine Clearance: 36.2 mL/min (A) (by C-G formula based on SCr of 1.13 mg/dL (H)). ? ?Liver Function Tests: ?Recent Labs  ?Lab 04/29/21 ?8921 05/01/21 ?0406 05/02/21 ?0115  ?AST '21 17 19  '$ ?ALT '19 18 16  '$ ?ALKPHOS 100 94 102  ?BILITOT 0.2* 0.4 0.4  ?PROT 6.6 7.6 7.6  ?ALBUMIN 1.9* 1.9* 2.0*  ? ? ? ?CBG: ?Recent Labs  ?Lab 05/04/21 ?1225 05/04/21 ?1557 05/04/21 ?2108 05/04/21 ?2212 05/05/21 ?1941  ?GLUCAP 209* 279* 451* 395* 237*  ? ? ? ? ?Recent Results (from the past 240 hour(s))  ?Culture, blood (Routine X 2) w Reflex to ID Panel     Status: None  ? Collection Time: 04/27/21 10:36 AM  ? Specimen:  BLOOD  ?Result Value Ref Range Status  ? Specimen Description BLOOD LEFT ANTECUBITAL  Final  ? Special Requests   Final  ?  BOTTLES DRAWN AEROBIC ONLY Blood Culture adequate volume  ? Culture   Final  ?  NO GROWTH 5 DAYS ?Performed at [facility], [place] [address] [place], [place] [address] ?  ? Report Status 05/02/2021 FINAL  Final  ?Culture, blood (Routine X 2) w Reflex to ID Panel     Status: None  ? Collection Time: 04/27/21 10:43 AM  ? Specimen: BLOOD LEFT ARM  ?Result Value Ref Range Status  ? Specimen Description BLOOD LEFT ARM  Final  ? Special Requests   Final  ?  BOTTLES DRAWN AEROBIC ONLY Blood Culture adequate volume  ? Culture   Final  ?  NO GROWTH 5 DAYS ?Performed at [facility], [place] [address], [place], [place] [address] ?  ? Report Status 05/02/2021 FINAL  Final  ?Urine Culture     Status: Abnormal  ? Collection Time: 04/27/21 11:24 AM  ? Specimen: In/Out Cath Urine  ?Result Value Ref Range Status  ? Specimen Description IN/OUT CATH URINE  Final  ? Special Requests   Final  ?  NONE ?Performed at [facility], [place] [address] [place], [place]  [address] ?  ? Culture 10,000 COLONIES/mL YEAST (A)  Final  ? Report Status 04/28/2021 FINAL  Final  ?MRSA Next Gen by PCR, Nasal     Status: None  ? Collection Time: 04/27/21 11:24 AM  ? Specimen: Urine, In & Out

## 2021-05-06 LAB — CBC WITH DIFFERENTIAL/PLATELET
Abs Immature Granulocytes: 0.56 10*3/uL — ABNORMAL HIGH (ref 0.00–0.07)
Basophils Absolute: 0.1 10*3/uL (ref 0.0–0.1)
Basophils Relative: 0 %
Eosinophils Absolute: 0 10*3/uL (ref 0.0–0.5)
Eosinophils Relative: 0 %
HCT: 31 % — ABNORMAL LOW (ref 36.0–46.0)
Hemoglobin: 9.7 g/dL — ABNORMAL LOW (ref 12.0–15.0)
Immature Granulocytes: 3 %
Lymphocytes Relative: 21 %
Lymphs Abs: 4.4 10*3/uL — ABNORMAL HIGH (ref 0.7–4.0)
MCH: 28.7 pg (ref 26.0–34.0)
MCHC: 31.3 g/dL (ref 30.0–36.0)
MCV: 91.7 fL (ref 80.0–100.0)
Monocytes Absolute: 1.9 10*3/uL — ABNORMAL HIGH (ref 0.1–1.0)
Monocytes Relative: 9 %
Neutro Abs: 13.7 10*3/uL — ABNORMAL HIGH (ref 1.7–7.7)
Neutrophils Relative %: 67 %
Platelets: 766 10*3/uL — ABNORMAL HIGH (ref 150–400)
RBC: 3.38 MIL/uL — ABNORMAL LOW (ref 3.87–5.11)
RDW: 15.3 % (ref 11.5–15.5)
WBC: 20.6 10*3/uL — ABNORMAL HIGH (ref 4.0–10.5)
nRBC: 0.1 % (ref 0.0–0.2)

## 2021-05-06 LAB — GLUCOSE, CAPILLARY
Glucose-Capillary: 282 mg/dL — ABNORMAL HIGH (ref 70–99)
Glucose-Capillary: 381 mg/dL — ABNORMAL HIGH (ref 70–99)
Glucose-Capillary: 338 mg/dL — ABNORMAL HIGH (ref 70–99)
Glucose-Capillary: 127 mg/dL — ABNORMAL HIGH (ref 70–99)
Glucose-Capillary: 105 mg/dL — ABNORMAL HIGH (ref 70–99)
Glucose-Capillary: 174 mg/dL — ABNORMAL HIGH (ref 70–99)

## 2021-05-06 LAB — BASIC METABOLIC PANEL
Anion gap: 8 (ref 5–15)
BUN: 22 mg/dL (ref 8–23)
CO2: 21 mmol/L — ABNORMAL LOW (ref 22–32)
Calcium: 8.2 mg/dL — ABNORMAL LOW (ref 8.9–10.3)
Chloride: 104 mmol/L (ref 98–111)
Creatinine, Ser: 0.84 mg/dL (ref 0.44–1.00)
GFR, Estimated: >60 mL/min (ref >60)
Glucose, Bld: 126 mg/dL — ABNORMAL HIGH (ref 70–99)
Potassium: 4.6 mmol/L (ref 3.5–5.1)
Sodium: 133 mmol/L — ABNORMAL LOW (ref 135–145)

## 2021-05-06 MED ORDER — INSULIN ASPART 100 UNIT/ML IJ SOLN
6.0000 [IU] | Freq: Three times a day (TID) | INTRAMUSCULAR | Status: DC
  Administered 2021-05-07 – 2021-05-09 (×8): 6 [IU] via SUBCUTANEOUS

## 2021-05-06 MED ORDER — INSULIN ASPART 100 UNIT/ML IJ SOLN
0.0000 [IU] | Freq: Three times a day (TID) | INTRAMUSCULAR | Status: DC
  Administered 2021-05-07: 8 [IU] via SUBCUTANEOUS
  Administered 2021-05-08: 3 [IU] via SUBCUTANEOUS
  Administered 2021-05-08: 8 [IU] via SUBCUTANEOUS
  Administered 2021-05-08: 3 [IU] via SUBCUTANEOUS
  Administered 2021-05-09: 2 [IU] via SUBCUTANEOUS
  Administered 2021-05-09: 8 [IU] via SUBCUTANEOUS

## 2021-05-06 NOTE — Progress Notes (Signed)
?PROGRESS NOTE ? ? ? ?Brittany [name]  XHB:[id number] DOB: 1960-12-05 DOA: 04/05/2021 ?PCP: [facility]  ? ?No chief complaint on file. ? ? ?Brief Narrative:  ? ?61 year old lady with prior history significant for hypertension, diabetes, CVA with right-sided weakness, COPD, tobacco abuse, anxiety depression, lupus, hyperlipidemia, s/p pancreatectomy and splenectomy presented to ED on February 22 for confusion. ? EMS states they witnessed what could be a partial seizure, and gave the patient a total of 4 mg of intranasal Versed in route to the hospital. Patient was noted to have DKA, subclinical seizure on EEG and incidental finding of COVID-19 viral infection.  Neurology was consulted and patient was transferred to [facility] for continuous EEG.  Admitted to the hospital service.  Now DKA resolved.  Patient is off isolation of COVID-19.  Psychiatry consulted for agitation recommended Keppra should be changed to Depakote. ? ?Pt seen and examined, she has completed the course of antibiotics. She is medically stable for discharge.  ?We will watch her without sitter today and see.  ? ?Assessment & Plan: ?  ?Principal Problem: ?  New onset seizure ([place]) ?Active Problems: ?  Acute metabolic encephalopathy ?  Sepsis ([place]) ?  DKA (diabetic ketoacidosis) ([place]) ?  Type 2 diabetes mellitus with hyperlipidemia ([place]) ?  Essential hypertension ?  Hypokalemia; hyperkalemia ?  Restless leg syndrome ?  Elevated troponin ?  History of stroke ?  Hypomagnesemia ?  COVID-19 virus infection ?  Status post colostomy [facility]) ?  COPD, mild ([place]) ?  Protein-calorie malnutrition, severe ?  Pressure injury of skin ?  Behavior related to cognitive impairment ? ?New-onset seizures ?Probably secondary to lowered seizure threshold from DKA and COVID. ?Neurology consulted initially and transitioned Keppra to Depakote.  Patient currently on Depakote 500 mg twice daily. ?Neurology recommends outpatient follow-up on discharge. ?No  seizures in the last 48 hours.  ? ? ?Sepsis probably secondary to aspiration pneumonia. ?Acute respiratory failure with hypoxia secondary to sedative medications ?Patient became more lethargic on 04/27/2021 with leukocytosis of 15.7, fever of 102 and heart rate of 128 with chest x-ray showing opacities in the right middle and lower lobes consistent with infiltrate.  Was also found to have a UTI. ?CT of the chest abdomen pelvis remarkable for a right lower lobe pulmonary consolidation consistent with pneumonia. ?Lactic acid normalized ?Blood cultures negative so far.  Completed the course of IV antibiotics.  ?She is more alert today and answering questions.  ?She is weaned off oxygen today.  ?Afebrile overnight,  ?Check labs today.  ? ? ?Acute metabolic encephalopathy ?Waxing and waning secondary to delirium and infection and medications.  ?Completed the course of antibiotics.  ?Still requiring sitter at bedside to orient the patient and for safety. We will try to watch her without sitter today safely.  ?SNF when we can safely d/c sitter.  ? ? ?DKA ?Resolved ?Probably secondary to noncompliance to medications.CBG (last 3)  ?Recent Labs  ?  05/06/21 ?0736 05/06/21 ?1136 05/06/21 ?1538  ?GLUCAP 381* 338* 127*  ? ?Change to moderate SSI, increased the Semglee to 25 units and increase the novolog to 5 units TID.  ? ? ? ?Type 2 diabetes mellitus with hyperlipidemia ?Insulin dependent.  ?Uncontrolled with hyperglycemia.  ?Currently on semglee 22 units, transitioned to 25 units, change to mod SSI and novolog 5 units TIDAC.  ? ?Essential hypertension ?Blood pressure parameters are well controlled.  ? ? ?Hyperkalemia:  ?Resolved.  ? ? ? ?S/p colostomy:  ?Wound  care consulted.  ? ? ?COPD:   ?No wheezIng heard. On RA.  ? ? ? ?Severe protein calorie malnutrition:  ?On supplementation.  ? ? ? Cognitive impairment  with behavioral abnormalities.  ?Currently on depakote 1250 mg at bedtime, along with clonazepam 0.25 mg BID, prn  haldol, zyprexa .  ? ? ? ? ?DVT prophylaxis: lovenox.  ?Code Status: (Full code) ?Family Communication: none at bedside.  ?Disposition:  ? ?Status is: Inpatient ?Remains inpatient appropriate because: to SNF when we can safely d/c sitter. Off restraints.  ?  ?Consultants:  ?Psychiatry.  ? ?Procedures:  ?CXR Improving interstitial opacities in the right lung. New mild airspace opacities in the left lower lobe, which may ?reflect atelectasis versus infection ? ?Antimicrobials: zosyn for aspiration pneumonia.  ? ?Subjective: ?No new complaints. Wants to go home.  ? ?Objective: ?Vitals:  ? 05/06/21 0354 05/06/21 0734 05/06/21 1115 05/06/21 1535  ?BP: 106/73 119/80 103/71 112/76  ?Pulse: 87 (!) 109 (!) 110 (!) 106  ?Resp: '17 16 17 18  '$ ?Temp: 97.8 ?F (36.6 ?C) 98.7 ?F (37.1 ?C) 98.6 ?F (37 ?C) 98.6 ?F (37 ?C)  ?TempSrc: Oral Oral Oral Oral  ?SpO2: 99% 99% 96% 95%  ?Weight:      ?Height:      ? ? ?Intake/Output Summary (Last 24 hours) at 05/06/2021 1707 ?Last data filed at 05/06/2021 1201 ?Gross per 24 hour  ?Intake 940 ml  ?Output --  ?Net 940 ml  ? ? ?Filed Weights  ? 04/30/21 0500 05/01/21 0304 05/02/21 1610  ?Weight: 44.6 kg 46.5 kg 43.9 kg  ? ? ?Examination: ?General exam: Appears calm and comfortable  ?Respiratory system: Clear to auscultation. Respiratory effort normal. ?Cardiovascular system: S1 & S2 heard, RRR. Marland Kitchen No pedal edema. ?Gastrointestinal system: Abdomen is nondistended, soft and nontender.  Normal bowel sounds heard. ?Central nervous system: Alert and oriented. No focal neurological deficits. ?Extremities: Symmetric 5 x 5 power. ?Skin: No rashes, lesions or ulcers ?Psychiatry: mood is okay.  ? ? ? ? ? ? ? ?Data Reviewed: I have personally reviewed following labs and imaging studies ? ?CBC: ?Recent Labs  ?Lab 04/30/21 ?0614 05/01/21 ?0406 05/02/21 ?0115 05/03/21 ?9604 05/04/21 ?0940  ?WBC 20.0* 17.6* 10.4 16.9* 19.9*  ?HGB 9.7* 9.3* 9.3* 11.1* 11.5*  ?HCT 32.1* 28.9* 29.9* 34.1* 38.2  ?MCV 95.5 91.2 92.3  90.2 93.6  ?PLT PLATELET CLUMPS NOTED ON SMEAR, UNABLE TO ESTIMATE 567* 570* 672* 685*  ? ? ? ?Basic Metabolic Panel: ?Recent Labs  ?Lab 05/01/21 ?0406 05/02/21 ?0115 05/03/21 ?5409 05/04/21 ?0940  ?NA 134* 134* 137 137  ?K 4.4 4.9 5.2* 4.8  ?CL 100 99 99 103  ?CO2 '26 26 24 22  '$ ?GLUCOSE 199* 262* 104* 149*  ?BUN 16 18 28* 36*  ?CREATININE 0.89 0.89 1.01* 1.13*  ?CALCIUM 8.8* 8.8* 9.1 8.8*  ?MG 2.1  --   --   --   ?PHOS 3.5  --   --   --   ? ? ? ?GFR: ?Estimated Creatinine Clearance: 36.2 mL/min (A) (by C-G formula based on SCr of 1.13 mg/dL (H)). ? ?Liver Function Tests: ?Recent Labs  ?Lab 05/01/21 ?0406 05/02/21 ?0115  ?AST 17 19  ?ALT 18 16  ?ALKPHOS 94 102  ?BILITOT 0.4 0.4  ?PROT 7.6 7.6  ?ALBUMIN 1.9* 2.0*  ? ? ? ?CBG: ?Recent Labs  ?Lab 05/05/21 ?2112 05/06/21 ?8119 05/06/21 ?0736 05/06/21 ?1136 05/06/21 ?1538  ?GLUCAP 124* 282* 381* 338* 127*  ? ? ? ? ?Recent Results (from  the past 240 hour(s))  ?Culture, blood (Routine X 2) w Reflex to ID Panel     Status: None  ? Collection Time: 04/27/21 10:36 AM  ? Specimen: BLOOD  ?Result Value Ref Range Status  ? Specimen Description BLOOD LEFT ANTECUBITAL  Final  ? Special Requests   Final  ?  BOTTLES DRAWN AEROBIC ONLY Blood Culture adequate volume  ? Culture   Final  ?  NO GROWTH 5 DAYS ?Performed at [facility], [place] [address], [place], [place] [address] ?  ? Report Status 05/02/2021 FINAL  Final  ?Culture, blood (Routine X 2) w Reflex to ID Panel     Status: None  ? Collection Time: 04/27/21 10:43 AM  ? Specimen: BLOOD LEFT ARM  ?Result Value Ref Range Status  ? Specimen Description BLOOD LEFT ARM  Final  ? Special Requests   Final  ?  BOTTLES DRAWN AEROBIC ONLY Blood Culture adequate volume  ? Culture   Final  ?  NO GROWTH 5 DAYS ?Performed at [facility], [place] [address], [place], [place] [address] ?  ? Report Status 05/02/2021 FINAL  Final  ?Urine Culture     Status: Abnormal  ? Collection Time: 04/27/21 11:24 AM  ? Specimen: In/Out Cath Urine   ?Result Value Ref Range Status  ? Specimen Description IN/OUT CATH URINE  Final  ? Special Requests   Final  ?  NONE ?Performed at [facility], [place] [address] [place], [place] [address] ?  ? Culture 10,000 C

## 2021-05-07 ENCOUNTER — Inpatient Hospital Stay (HOSPITAL_COMMUNITY): Payer: Medicare Other

## 2021-05-07 LAB — GLUCOSE, CAPILLARY
Glucose-Capillary: 92 mg/dL (ref 70–99)
Glucose-Capillary: 257 mg/dL — ABNORMAL HIGH (ref 70–99)
Glucose-Capillary: 465 mg/dL — ABNORMAL HIGH (ref 70–99)
Glucose-Capillary: 524 mg/dL (ref 70–99)
Glucose-Capillary: 417 mg/dL — ABNORMAL HIGH (ref 70–99)

## 2021-05-07 LAB — PROCALCITONIN: Procalcitonin: 0.37 ng/mL

## 2021-05-07 MED ORDER — INSULIN ASPART 100 UNIT/ML IJ SOLN
10.0000 [IU] | INTRAMUSCULAR | Status: AC
  Administered 2021-05-07: 10 [IU] via SUBCUTANEOUS

## 2021-05-07 MED ORDER — INSULIN GLARGINE-YFGN 100 UNIT/ML ~~LOC~~ SOLN
20.0000 [IU] | Freq: Two times a day (BID) | SUBCUTANEOUS | Status: DC
  Administered 2021-05-08 – 2021-05-09 (×4): 20 [IU] via SUBCUTANEOUS
  Filled 2021-05-07 (×5): qty 0.2

## 2021-05-07 NOTE — Progress Notes (Signed)
Recheck CBG-417 after insulin coverage. Notified Dr. Nevada Crane. See new order.  ?

## 2021-05-07 NOTE — Progress Notes (Signed)
Notified MD that 1700 insulin is not documented as given and CBG was 465 at that time. Rechecking CBG and will cover per order for night coverage.  ?

## 2021-05-07 NOTE — Progress Notes (Signed)
?PROGRESS NOTE ? ? ? ?Brittany [name]  HYW:[id number] DOB: 1960/04/14 DOA: 04/05/2021 ?PCP: [facility]  ? ?No chief complaint on file. ? ? ?Brief Narrative:  ? ?61 year old lady with prior history significant for hypertension, diabetes, CVA with right-sided weakness, COPD, tobacco abuse, anxiety depression, lupus, hyperlipidemia, s/p pancreatectomy and splenectomy presented to ED on February 22 for confusion. ? EMS states they witnessed what could be a partial seizure, and gave the patient a total of 4 mg of intranasal Versed in route to the hospital. Patient was noted to have DKA, subclinical seizure on EEG and incidental finding of COVID-19 viral infection.  Neurology was consulted and patient was transferred to [facility] for continuous EEG.  Admitted to the hospital service.  Now DKA resolved.  Patient is off isolation of COVID-19.  Psychiatry consulted for agitation recommended Keppra should be changed to Depakote. ? ?Pt seen and examined, she has completed the course of antibiotics. She is medically stable for discharge.  ?Currently off the sitter.  ? ?Assessment & Plan: ?  ?Principal Problem: ?  New onset seizure () ?Active Problems: ?  Acute metabolic encephalopathy ?  Sepsis ([place]) ?  DKA (diabetic ketoacidosis) ([place]) ?  Type 2 diabetes mellitus with hyperlipidemia ([place]) ?  Essential hypertension ?  Hypokalemia; hyperkalemia ?  Restless leg syndrome ?  Elevated troponin ?  History of stroke ?  Hypomagnesemia ?  COVID-19 virus infection ?  Status post colostomy [facility]) ?  COPD, mild ([place]) ?  Protein-calorie malnutrition, severe ?  Pressure injury of skin ?  Behavior related to cognitive impairment ? ?New-onset seizures ?Probably secondary to lowered seizure threshold from DKA and COVID. ?Neurology consulted initially and transitioned Keppra to Depakote.  Patient currently on Depakote 500 mg twice daily. ?Neurology recommends outpatient follow-up on discharge. ?No seizures in the last 48  hours.  ?She is alert and answering simple questions.  ? ?Sepsis probably secondary to aspiration pneumonia. ?Acute respiratory failure with hypoxia secondary to sedative medications ?Patient became more lethargic on 04/27/2021 with leukocytosis of 15.7, fever of 102 and heart rate of 128 with chest x-ray showing opacities in the right middle and lower lobes consistent with infiltrate.  Was also found to have a UTI. ?CT of the chest abdomen pelvis remarkable for a right lower lobe pulmonary consolidation consistent with pneumonia. ?Lactic acid normalized ?Blood cultures negative so far.  Completed the course of IV antibiotics.  ?Afebrile overnight, but persistent leukocytosis. CBC shows wbc count of 20,000.  ?Check pro calcitonin and get CXR .  ? ? ?Acute metabolic encephalopathy ?Waxing and waning secondary to delirium and infection and medications.  ?Completed the course of antibiotics.  ?We discontinued sitter on 3/25 and has been off sitter since then.  ?SNF when we can be off sitter for more that 48 hours.  ? ? ?DKA ?Resolved ?Probably secondary to noncompliance to medications.CBG (last 3)  ?Recent Labs  ?  05/06/21 ?2127 05/07/21 ?0758 05/07/21 ?1138  ?GLUCAP 174* 92 257*  ? ?Change to moderate SSI,decrease the Semglee to 22 units daily.   ? ? ? ?Type 2 diabetes mellitus with hyperlipidemia ?Insulin dependent.  ?Uncontrolled with hyperglycemia.  ?Continue with Semglee and SSI and Novolog.  ? ?Essential hypertension ?Blood pressure parameters are optimal.  ? ? ?Hyperkalemia:  ?Resolved.  ? ? ? ?S/p colostomy:  ?Wound care consulted.  ? ? ?COPD:   ?No wheezIng heard. On RA.  ? ? ? ?Severe protein calorie malnutrition:  ?On supplementation.  ? ? ?  Cognitive impairment  with behavioral abnormalities.  ?Currently on depakote 1250 mg at bedtime, along with clonazepam 0.25 mg BID, prn haldol, zyprexa .  ? ? ? ? ?DVT prophylaxis: lovenox.  ?Code Status: (Full code) ?Family Communication: none at bedside.  ?Disposition:   ? ?Status is: Inpatient ?Remains inpatient appropriate because: to SNF when we can safely d/c sitter. Off restraints.  ?  ?Consultants:  ?Psychiatry.  ? ?Procedures:  ?CXR Improving interstitial opacities in the right lung. New mild airspace opacities in the left lower lobe, which may ?reflect atelectasis versus infection ? ?Antimicrobials: zosyn for aspiration pneumonia.  ? ?Subjective: ?No new complaints.  ? ?Objective: ?Vitals:  ? 05/06/21 1535 05/06/21 1912 05/06/21 2302 05/07/21 1105  ?BP: 112/76 111/73 126/89 94/77  ?Pulse: (!) 106 (!) 105 99 100  ?Resp: '18 17 17 18  '$ ?Temp: 98.6 ?F (37 ?C) 98 ?F (36.7 ?C) 98 ?F (36.7 ?C) 98 ?F (36.7 ?C)  ?TempSrc: Oral Oral Oral   ?SpO2: 95% 97% 99% 100%  ?Weight:      ?Height:      ? ? ?Intake/Output Summary (Last 24 hours) at 05/07/2021 1358 ?Last data filed at 05/07/2021 0800 ?Gross per 24 hour  ?Intake 720 ml  ?Output --  ?Net 720 ml  ? ? ?Filed Weights  ? 04/30/21 0500 05/01/21 0304 05/02/21 3235  ?Weight: 44.6 kg 46.5 kg 43.9 kg  ? ? ?Examination: ?General exam: Appears calm and comfortable  ?Respiratory system: Clear to auscultation. Respiratory effort normal. ?Cardiovascular system: S1 & S2 heard, RRR. No JVD, No pedal edema. ?Gastrointestinal system: Abdomen is nondistended, soft and nontender. Normal bowel sounds heard. ?Central nervous system: Alert and oriented. No focal neurological deficits. ?Extremities: Symmetric 5 x 5 power. ?Skin: No rashes, lesions or ulcers ?Psychiatry: Mood & affect appropriate.  ? ? ? ? ? ? ? ? ?Data Reviewed: I have personally reviewed following labs and imaging studies ? ?CBC: ?Recent Labs  ?Lab 05/01/21 ?0406 05/02/21 ?0115 05/03/21 ?5732 05/04/21 ?2025 05/06/21 ?1750  ?WBC 17.6* 10.4 16.9* 19.9* 20.6*  ?NEUTROABS  --   --   --   --  13.7*  ?HGB 9.3* 9.3* 11.1* 11.5* 9.7*  ?HCT 28.9* 29.9* 34.1* 38.2 31.0*  ?MCV 91.2 92.3 90.2 93.6 91.7  ?PLT 567* 570* 672* 685* 766*  ? ? ? ?Basic Metabolic Panel: ?Recent Labs  ?Lab 05/01/21 ?0406  05/02/21 ?0115 05/03/21 ?4270 05/04/21 ?6237 05/06/21 ?1750  ?NA 134* 134* 137 137 133*  ?K 4.4 4.9 5.2* 4.8 4.6  ?CL 100 99 99 103 104  ?CO2 '26 26 24 22 '$ 21*  ?GLUCOSE 199* 262* 104* 149* 126*  ?BUN 16 18 28* 36* 22  ?CREATININE 0.89 0.89 1.01* 1.13* 0.84  ?CALCIUM 8.8* 8.8* 9.1 8.8* 8.2*  ?MG 2.1  --   --   --   --   ?PHOS 3.5  --   --   --   --   ? ? ? ?GFR: ?Estimated Creatinine Clearance: 48.7 mL/min (by C-G formula based on SCr of 0.84 mg/dL). ? ?Liver Function Tests: ?Recent Labs  ?Lab 05/01/21 ?0406 05/02/21 ?0115  ?AST 17 19  ?ALT 18 16  ?ALKPHOS 94 102  ?BILITOT 0.4 0.4  ?PROT 7.6 7.6  ?ALBUMIN 1.9* 2.0*  ? ? ? ?CBG: ?Recent Labs  ?Lab 05/06/21 ?1538 05/06/21 ?1711 05/06/21 ?2127 05/07/21 ?6283 05/07/21 ?1138  ?GLUCAP 127* 105* 174* 92 257*  ? ? ? ? ?Recent Results (from the past 240 hour(s))  ?Culture, blood (Routine X  2) w Reflex to ID Panel     Status: None (Preliminary result)  ? Collection Time: 05/03/21  1:30 PM  ? Specimen: BLOOD RIGHT HAND  ?Result Value Ref Range Status  ? Specimen Description BLOOD RIGHT HAND  Final  ? Special Requests   Final  ?  BOTTLES DRAWN AEROBIC AND ANAEROBIC Blood Culture adequate volume  ? Culture   Final  ?  NO GROWTH 4 DAYS ?Performed at [facility], [place] [address] [place], [place] [address] ?  ? Report Status PENDING  Incomplete  ?Culture, blood (Routine X 2) w Reflex to ID Panel     Status: None (Preliminary result)  ? Collection Time: 05/03/21  1:30 PM  ? Specimen: Left Antecubital; Blood  ?Result Value Ref Range Status  ? Specimen Description LEFT ANTECUBITAL  Final  ? Special Requests   Final  ?  BOTTLES DRAWN AEROBIC AND ANAEROBIC Blood Culture adequate volume  ? Culture   Final  ?  NO GROWTH 4 DAYS ?Performed at [facility], [place] [address], [place], [place] [address] ?  ? Report Status PENDING  Incomplete  ? ?  ? ? ? ? ? ?Radiology Studies: ?No results found. ? ? ? ? ? ?Scheduled Meds: ? atorvastatin  40 mg Oral QHS  ? clonazePAM  0.25 mg Oral BID   ? divalproex  1,250 mg Oral QHS  ? enoxaparin (LOVENOX) injection  20 mg Subcutaneous Q24H  ? feeding supplement (GLUCERNA SHAKE)  237 mL Oral TID BM  ? gabapentin  200 mg Oral TID  ? Gerhardt's butt cream

## 2021-05-07 NOTE — Progress Notes (Addendum)
CBG 524 and reported Dr. Nevada Crane. Awaiting new order.  ?Dr Nevada Crane VO to give 10 units Nov sliding scale and the additional 6 units that was not given at 1700 for a total of 16 units Novolog.  ?

## 2021-05-08 ENCOUNTER — Inpatient Hospital Stay (HOSPITAL_COMMUNITY): Payer: Medicare Other

## 2021-05-08 LAB — CBC WITH DIFFERENTIAL/PLATELET
Abs Immature Granulocytes: 0.62 10*3/uL — ABNORMAL HIGH (ref 0.00–0.07)
Basophils Absolute: 0.1 10*3/uL (ref 0.0–0.1)
Basophils Relative: 0 %
Eosinophils Absolute: 0.1 10*3/uL (ref 0.0–0.5)
Eosinophils Relative: 0 %
HCT: 30.9 % — ABNORMAL LOW (ref 36.0–46.0)
Hemoglobin: 9.8 g/dL — ABNORMAL LOW (ref 12.0–15.0)
Immature Granulocytes: 3 %
Lymphocytes Relative: 29 %
Lymphs Abs: 5.5 10*3/uL — ABNORMAL HIGH (ref 0.7–4.0)
MCH: 29 pg (ref 26.0–34.0)
MCHC: 31.7 g/dL (ref 30.0–36.0)
MCV: 91.4 fL (ref 80.0–100.0)
Monocytes Absolute: 1.5 10*3/uL — ABNORMAL HIGH (ref 0.1–1.0)
Monocytes Relative: 8 %
Neutro Abs: 11.4 10*3/uL — ABNORMAL HIGH (ref 1.7–7.7)
Neutrophils Relative %: 60 %
Platelets: 837 10*3/uL — ABNORMAL HIGH (ref 150–400)
RBC: 3.38 MIL/uL — ABNORMAL LOW (ref 3.87–5.11)
RDW: 15.3 % (ref 11.5–15.5)
WBC: 19.1 10*3/uL — ABNORMAL HIGH (ref 4.0–10.5)
nRBC: 0.1 % (ref 0.0–0.2)

## 2021-05-08 LAB — GLUCOSE, CAPILLARY
Glucose-Capillary: 187 mg/dL — ABNORMAL HIGH (ref 70–99)
Glucose-Capillary: 268 mg/dL — ABNORMAL HIGH (ref 70–99)
Glucose-Capillary: 166 mg/dL — ABNORMAL HIGH (ref 70–99)
Glucose-Capillary: 165 mg/dL — ABNORMAL HIGH (ref 70–99)

## 2021-05-08 LAB — CULTURE, BLOOD (ROUTINE X 2)
Culture: NO GROWTH
Culture: NO GROWTH
Special Requests: ADEQUATE
Special Requests: ADEQUATE

## 2021-05-08 MED ORDER — IOHEXOL 9 MG/ML PO SOLN
ORAL | Status: AC
  Administered 2021-05-08: 500 mL
  Filled 2021-05-08: qty 1000

## 2021-05-08 MED ORDER — IOHEXOL 300 MG/ML  SOLN
100.0000 mL | Freq: Once | INTRAMUSCULAR | Status: AC | PRN
Start: 2021-05-08 — End: 2021-05-08
  Administered 2021-05-08: 45 mL via INTRAVENOUS

## 2021-05-08 NOTE — Progress Notes (Signed)
Physical Therapy Treatment ?Patient Details ?Name: Brittany [name] ?MRN: [id number] ?DOB: [birth year] ?Today's Date: 05/08/2021 ? ? ?History of Present Illness Caterin Tabares is a 66yoF who was brought to the ED for confusion plus DKA.  EEG noted subclinical seizure.  Also found to have incidental COVID. 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 Pt admitted with diverticulitis of intestine with abscess. ? ?  ?PT Comments  ? ? Pt frustrated trying to get family on the phone on my arrival.  Spent extensive time helping get in touch with family which in turn helped redirect pt's confusion toward therapy.  Emphasis then on safety with transfers and gait.  Specifically gait stability, quality, safety with RW  and stamina. ?   ?Recommendations for follow up therapy are one component of a multi-disciplinary discharge planning process, led by the attending physician.  Recommendations may be updated based on patient status, additional functional criteria and insurance authorization. ? ?Follow Up Recommendations ? Skilled nursing-short term rehab (<3 hours/day) ?  ?  ?Assistance Recommended at Discharge Frequent or constant Supervision/Assistance  ?Patient can return home with the following Assistance with cooking/housework;Direct supervision/assist for medications management;Help with stairs or ramp for entrance;Assist for transportation;A little help with walking and/or transfers;A little help with bathing/dressing/bathroom ?  ?Equipment Recommendations ? None recommended by PT  ?  ?Recommendations for Other Services   ? ? ?  ?Precautions / Restrictions Precautions ?Precautions: Fall ?Precaution Comments: seizure, urinary incontinence, colostomy bag frequently comes off  ?  ? ?Mobility ? Bed Mobility ?Overal bed mobility: Modified Independent ?  ?  ?  ?  ?  ?  ?  ?  ? ?Transfers ?Overall transfer level:  Needs assistance ?Equipment used: None, Rolling walker (2 wheels) ?Transfers: Sit to/from Stand ?Sit to Stand: Min guard ?  ?  ?  ?  ?  ?General transfer comment: consistent cues concerning safety issues, no physical needs for transfers ?  ? ?Ambulation/Gait ?Ambulation/Gait assistance: Min assist, Min guard ?Gait Distance (Feet): 350 Feet ?Assistive device: Rolling walker (2 wheels) ?Gait Pattern/deviations: Step-through pattern ?Gait velocity: decr ?Gait velocity interpretation: <1.8 ft/sec, indicate of risk for recurrent falls ?  ?General Gait Details: generally steady with mild flexed posture, cues for posture and proximity to the RW.  pt frequently drifts R into stationary surfaces and needs min maneuvering assist on occasion due to this.  Pt does not pick up on environmental cues to find her way back to the room she has been in for a while without multimodal cuing. ? ? ?Stairs ?  ?  ?  ?  ?  ? ? ?Wheelchair Mobility ?  ? ?Modified Rankin (Stroke Patients Only) ?  ? ? ?  ?Balance Overall balance assessment: Needs assistance ?  ?Sitting balance-Leahy Scale: Good ?  ?  ?Standing balance support: Single extremity supported, Bilateral upper extremity supported, During functional activity ?Standing balance-Leahy Scale: Poor ?Standing balance comment: reliant on AD or exteernal support. ?  ?  ?  ?  ?  ?  ?  ?  ?  ?  ?  ?  ? ?  ?Cognition Arousal/Alertness: Awake/alert ?Behavior During Therapy: Restless, Agitated ?Overall Cognitive Status: Impaired/Different from baseline ?  ?  ?  ?  ?  ?  ?  ?  ?  ?Orientation Level: Disoriented to, Time, Place ?Current Attention Level: Sustained ?Memory: Decreased recall of  precautions, Decreased short-term memory ?Following Commands: Follows one step commands with increased time ?Safety/Judgement: Decreased awareness of safety, Decreased awareness of deficits ?Awareness: Intellectual ?Problem Solving: Requires verbal cues, Requires tactile cues, Slow processing ?General Comments:  Slow processing, hands on support, multimodal cuing ?  ?  ? ?  ?Exercises   ? ?  ?General Comments General comments (skin integrity, edema, etc.): On arrival, pt was restless and agitated due to not able to get family member on the phone.  She was confused, saying that her sister was here in the building coming to pick her up.  This therapist called family members to get the correct information and help orient pt to present time.  At at any given time, pt may be able to answer orientation questions, but be confused about events and sequencing of information she has heard or tried to piece together. ?  ?  ? ?Pertinent Vitals/Pain Pain Assessment ?Pain Assessment: Faces ?Faces Pain Scale: No hurt ?Pain Intervention(s): Monitored during session  ? ? ?Home Living   ?  ?  ?  ?  ?  ?  ?  ?  ?  ?   ?  ?Prior Function    ?  ?  ?   ? ?PT Goals (current goals can now be found in the care plan section) Acute Rehab PT Goals ?Patient Stated Goal: I want to go home NOW! ?PT Goal Formulation: With patient ?Potential to Achieve Goals: Fair ?Progress towards PT goals: Progressing toward goals (cognifion continues to be her main limitation) ? ?  ?Frequency ? ? ? Min 2X/week ? ? ? ?  ?PT Plan Current plan remains appropriate  ? ? ?Co-evaluation   ?  ?  ?  ?  ? ?  ?AM-PAC PT "6 Clicks" Mobility   ?Outcome Measure ? Help needed turning from your back to your side while in a flat bed without using bedrails?: A Little ?Help needed moving from lying on your back to sitting on the side of a flat bed without using bedrails?: A Little ?Help needed moving to and from a bed to a chair (including a wheelchair)?: A Little ?Help needed standing up from a chair using your arms (e.g., wheelchair or bedside chair)?: A Little ?Help needed to walk in hospital room?: A Little ?Help needed climbing 3-5 steps with a railing? : A Little ?6 Click Score: 18 ? ?  ?End of Session   ?Activity Tolerance: Patient tolerated treatment well ?Patient left: in  chair;with call bell/phone within reach;with chair alarm set ?Nurse Communication: Mobility status ?PT Visit Diagnosis: Other abnormalities of gait and mobility (R26.89);Muscle weakness (generalized) (M62.81);Other symptoms and signs involving the nervous system (R29.898) ?  ? ? ?Time: 1610-9604 ?PT Time Calculation (min) (ACUTE ONLY): 41 min ? ?Charges:  $Gait Training: 8-22 mins ?$Therapeutic Activity: 8-22 mins ?$Self Care/Home Management: 8-22          ?          ? ?05/08/2021 ? ?Ginger Carne., PT ?Acute Rehabilitation Services ?[phone]  (pager) ?[phone]  (office) ? ? ?Tessie Fass Niko Jakel ?05/08/2021, 11:52 AM ? ?

## 2021-05-08 NOTE — Progress Notes (Signed)
Patient refusing to let me start a new IV on her after she messed up her previous IV during her CT outburst.  Karleen Hampshire, MD notified and okayed leaving her without an IV for now. ?

## 2021-05-08 NOTE — Progress Notes (Signed)
?  Evaluation after Contrast Extravasation ? ?Patient seen and examined immediately after contrast extravasation while in CT scanner. ? ?Exam: ?There is mild swelling at the right forearm area.  ?There is no erythema. ?There is no discoloration. ?There are no blisters. ?There are no signs of decreased perfusion of the skin.  ?It is warm to touch.  ?The patient has full ROM in fingers.  ?Radial pulse is  normal. ? ?Per contrast extravasation protocol, I have instructed the patient to keep an ice pack on the area for 20-60 minutes at a time for about 48 hours. ?  ?Keep arm elevated as much as possible. ?  ?The patient understands to call the radiology department if there is: ?- increase in pain or swelling ?- changed or altered sensation ?- ulceration or blistering ?- increasing redness ?- warmth or increasing firmness ?- decreased tissue perfusion as noted by decreased capillary refill or discoloration of skin ?- decreased pulses peripheral to site ? ? ?Jacqualine Mau PA-C ?05/08/2021 ?1:49 PM ? ? ?  ?

## 2021-05-08 NOTE — Progress Notes (Signed)
Nutrition Follow-up ? ?DOCUMENTATION CODES:  ?Severe malnutrition in context of chronic illness, Underweight ? ?INTERVENTION:  ?-continue Glucerna Shake po TID, each supplement provides 220 kcal and 10 grams of protein, 16 grams carbohydrate ?-continue MVI with minerals daily ? ?NUTRITION DIAGNOSIS:  ?Severe Malnutrition related to chronic illness (cancer, lupus) as evidenced by severe muscle depletion, severe fat depletion, percent weight loss. -- ongoing ? ?GOAL:  ?Patient will meet greater than or equal to 90% of their needs -- progressing ? ?MONITOR:  ?Diet advancement, Labs, Weight trends, Skin, I & O's ? ?REASON FOR ASSESSMENT:  ?Malnutrition Screening Tool ?  ? ?ASSESSMENT:  ?Pt with PMH significant for DM, HTN, lupus, Ca, CVA w/ R-sided weakness, COPD, and s/p pancreatectomy and splenectomy admitted with acute metabolic encephalopathy and DKA. EEG noted subclinical seizure. ? ?2/24 - Cortrak placed (gastric tip confirmed via xray) ?2/25 - diet advanced to full liquids ?2/26 - diet advanced to dysphagia 2 with thin liquids ?2/27 - cortrak removed ?3/01 - diet advanced to regular texture ? ?Pt without new complaints. Intake has been great since last RD visit, noted 75-100% meal completion. Per RN, pt continues to do well with ONS. Recommend continue current nutrition plan of care.  ? ?Medications: SSI TID w/ meals and bedtime, 6 units novolog TID w/ meals, 20 units semglee BID, creon TID w/ meals, thiamine, mvi with minerals ?Labs reviewed.  ?CBGs: 166-524 x24 hours - diabetes coordinator following ?A1c (last taken 04/06/21) 15.0 ? ?UOP: 2x unmeasured occurrences x24 hours ?Colostomy output: 984m x24 hours ?I/O: + [id number] admit ? ?Admit wt 43.6 kg ?Current wt 43.9 kg ? ?Diet Order:   ?Diet Order   ? ?       ?  Diet Carb Modified Fluid consistency: Thin; Room service appropriate? Yes with Assist  Diet effective now       ?  ? ?  ?  ? ?  ? ?EDUCATION NEEDS:  ?Not appropriate for education at this  time ? ?Skin:  Skin Assessment: Skin Integrity Issues: ?Skin Integrity Issues:: Other (Comment) ?Incisions: abdomen (from previous admit) ?Other: MASD buttocks ? ?Last BM:  3/27 via colostomy ? ?Height:  ?Ht Readings from Last 1 Encounters:  ?04/05/21 5' (1.524 m)  ? ?Weight:  ?Wt Readings from Last 1 Encounters:  ?05/02/21 43.9 kg  ? ?BMI:  Body mass index is 18.9 kg/m?. ? ?Estimated Nutritional Needs:  ?Kcal:  1400-1600 ?Protein:  70-80 grams ?Fluid:  >1.4L ? ? ?AmTheone Stanley MS, RD, LDN (she/her/hers) ?RD pager number and weekend/on-call pager number located in [place]? ?

## 2021-05-08 NOTE — Progress Notes (Signed)
?PROGRESS NOTE ? ? ? ?Brittany [name]  DGU:[id number] DOB: Aug 01, 1960 DOA: 04/05/2021 ?PCP: [facility]  ? ?No chief complaint on file. ? ? ?Brief Narrative:  ? ?61 year old lady with prior history significant for hypertension, diabetes, CVA with right-sided weakness, COPD, tobacco abuse, anxiety depression, lupus, hyperlipidemia, s/p pancreatectomy and splenectomy presented to ED on February 22 for confusion. ? EMS states they witnessed what could be a partial seizure, and gave the patient a total of 4 mg of intranasal Versed in route to the hospital. Patient was noted to have DKA, subclinical seizure on EEG and incidental finding of COVID-19 viral infection.  Neurology was consulted and patient was transferred to [facility] for continuous EEG.  Admitted to the hospital service.  Now DKA resolved.  Patient is off isolation of COVID-19.  Psychiatry consulted for agitation recommended Keppra should be changed to Depakote. ? ?Pt seen and examined, she has completed the course of antibiotics. She is medically stable for discharge.  ?Currently off the sitter.  ? ?Assessment & Plan: ?  ?Principal Problem: ?  New onset seizure (Brittany [name]) ?Active Problems: ?  Acute metabolic encephalopathy ?  Sepsis (Brittany [name]) ?  DKA (diabetic ketoacidosis) (Brittany [name]) ?  Type 2 diabetes mellitus with hyperlipidemia (Brittany [name]) ?  Essential hypertension ?  Hypokalemia; hyperkalemia ?  Restless leg syndrome ?  Elevated troponin ?  History of stroke ?  Hypomagnesemia ?  COVID-19 virus infection ?  Status post colostomy [name]) ?  COPD, mild ([place]) ?  Protein-calorie malnutrition, severe ?  Pressure injury of skin ?  Behavior related to cognitive impairment ? ?New-onset seizures ?Probably secondary to lowered seizure threshold from DKA and COVID. ?Neurology consulted initially and transitioned Keppra to Depakote.  Patient currently on Depakote 500 mg twice daily. ?Neurology recommends outpatient follow-up on discharge. ?No seizures in the last 48  hours.  ?She is alert and answering simple questions.  ? ?Sepsis probably secondary to aspiration pneumonia. ?Acute respiratory failure with hypoxia secondary to sedative medications ?Patient became more lethargic on 04/27/2021 with leukocytosis of 15.7, fever of 102 and heart rate of 128 with chest x-ray showing opacities in the right middle and lower lobes consistent with infiltrate.  Was also found to have a UTI. ?CT of the chest abdomen pelvis remarkable for a right lower lobe pulmonary consolidation consistent with pneumonia. ?Lactic acid normalized ?Blood cultures negative so far.  Completed the course of IV antibiotics.  ?Afebrile overnight, but persistent leukocytosis.  CXR shows improvement in consolidation and repeat cbc shows 19,000.  ?CT abd and pelvis ordered, but pt refused to undergo the procedure and ripped the IV line.  ? ? ? ? ?Acute metabolic encephalopathy ?Waxing and waning secondary to delirium and infection and medications.  ?Completed the course of antibiotics.  ?We discontinued sitter on 3/25 and has been off sitter since then.  ?SNF when we can be off sitter for more that 48 hours.  ? ? ?DKA ?Resolved ?Probably secondary to noncompliance to medications.CBG (last 3)  ?Recent Labs  ?  05/07/21 ?2307 05/08/21 ?0611 05/08/21 ?1137  ?GLUCAP 417* 187* 268*  ? ?Change to moderate SSI,decrease the Semglee to 22 units daily.   ? ? ? ?Type 2 diabetes mellitus with hyperlipidemia ?Insulin dependent.  ?Uncontrolled with hyperglycemia.  ?Continue with Semglee and SSI and Novolog.  ? ?Essential hypertension ?Blood pressure parameters are well controlled.  ? ? ?Hyperkalemia:  ?Resolved.  ? ? ? ?S/p colostomy:  ?Wound care consulted.  ? ? ?COPD:   ?  No wheezIng heard. On RA.  ? ? ? ?Severe protein calorie malnutrition:  ?On supplementation. ?Pt is also lactose intolerant.   ? ? ? Cognitive impairment  with behavioral abnormalities.  ?Currently on depakote 1250 mg at bedtime, along with clonazepam 0.25 mg  BID, prn haldol, zyprexa .  ? ? ?Anemia of chronic disease:  ?Hemoglobin stable around 9.7 to 9.8.  ? ? ? ?Thrombocytosis:  ?Probably reactive.  ? ? ? ? ?DVT prophylaxis: lovenox.  ?Code Status: (Full code) ?Family Communication: none at bedside.  ?Disposition:  ? ?Status is: Inpatient ?Remains inpatient appropriate because: to SNF when we can safely d/c sitter. Off restraints.  ?  ?Consultants:  ?Psychiatry.  ? ?Procedures:  ?CXR Improving interstitial opacities in the right lung. New mild airspace opacities in the left lower lobe, which may ?reflect atelectasis versus infection ? ?Antimicrobials: zosyn for aspiration pneumonia.  ? ?Subjective: ?Appears upset this afternoon. No new complaints.  ? ?Objective: ?Vitals:  ? 05/07/21 2307 05/08/21 0301 05/08/21 0722 05/08/21 1140  ?BP: 112/90 99/70 111/83 103/81  ?Pulse: (!) 112 84 97 95  ?Resp: '17 16 18 14  '$ ?Temp: 98 ?F (36.7 ?C) 98 ?F (36.7 ?C) 97.7 ?F (36.5 ?C) 98.6 ?F (37 ?C)  ?TempSrc:  Oral Oral Oral  ?SpO2: 100% 97% 97% 100%  ?Weight:      ?Height:      ? ? ?Intake/Output Summary (Last 24 hours) at 05/08/2021 1526 ?Last data filed at 05/08/2021 1216 ?Gross per 24 hour  ?Intake 1100 ml  ?Output 900 ml  ?Net 200 ml  ? ? ?Filed Weights  ? 04/30/21 0500 05/01/21 0304 05/02/21 3810  ?Weight: 44.6 kg 46.5 kg 43.9 kg  ? ? ?Examination: ?General exam: Appears calm and comfortable  ?Respiratory system: Clear to auscultation. Respiratory effort normal. ?Cardiovascular system: S1 & S2 heard, RRR. No JVD,  No pedal edema. ?Gastrointestinal system: Abdomen is nondistended, soft and nontender. Normal bowel sounds heard. ?Central nervous system: Alert and oriented to person only.  ?Extremities: Symmetric 5 x 5 power. ?Skin: No rashes, lesions or ulcers ?Psychiatry:  Mood & affect appropriate.  ? ? ? ? ? ? ? ? ? ?Data Reviewed: I have personally reviewed following labs and imaging studies ? ?CBC: ?Recent Labs  ?Lab 05/02/21 ?0115 05/03/21 ?1751 05/04/21 ?0258 05/06/21 ?1750  05/08/21 ?5277  ?WBC 10.4 16.9* 19.9* 20.6* 19.1*  ?NEUTROABS  --   --   --  13.7* 11.4*  ?HGB 9.3* 11.1* 11.5* 9.7* 9.8*  ?HCT 29.9* 34.1* 38.2 31.0* 30.9*  ?MCV 92.3 90.2 93.6 91.7 91.4  ?PLT 570* 672* 685* 766* 837*  ? ? ? ?Basic Metabolic Panel: ?Recent Labs  ?Lab 05/02/21 ?0115 05/03/21 ?8242 05/04/21 ?3536 05/06/21 ?1750  ?NA 134* 137 137 133*  ?K 4.9 5.2* 4.8 4.6  ?CL 99 99 103 104  ?CO2 '26 24 22 '$ 21*  ?GLUCOSE 262* 104* 149* 126*  ?BUN 18 28* 36* 22  ?CREATININE 0.89 1.01* 1.13* 0.84  ?CALCIUM 8.8* 9.1 8.8* 8.2*  ? ? ? ?GFR: ?Estimated Creatinine Clearance: 48.7 mL/min (by C-G formula based on SCr of 0.84 mg/dL). ? ?Liver Function Tests: ?Recent Labs  ?Lab 05/02/21 ?0115  ?AST 19  ?ALT 16  ?ALKPHOS 102  ?BILITOT 0.4  ?PROT 7.6  ?ALBUMIN 2.0*  ? ? ? ?CBG: ?Recent Labs  ?Lab 05/07/21 ?1613 05/07/21 ?2108 05/07/21 ?2307 05/08/21 ?1443 05/08/21 ?1137  ?[place] ? ? ? ?Recent Results (from the past 240 hour(s))  ?  Culture, blood (Routine X 2) w Reflex to ID Panel     Status: None  ? Collection Time: 05/03/21  1:30 PM  ? Specimen: BLOOD RIGHT HAND  ?Result Value Ref Range Status  ? Specimen Description BLOOD RIGHT HAND  Final  ? Special Requests   Final  ?  BOTTLES DRAWN AEROBIC AND ANAEROBIC Blood Culture adequate volume  ? Culture   Final  ?  NO GROWTH 5 DAYS ?Performed at [facility], [place] [address], [place], [place] [address] ?  ? Report Status 05/08/2021 FINAL  Final  ?Culture, blood (Routine X 2) w Reflex to ID Panel     Status: None  ? Collection Time: 05/03/21  1:30 PM  ? Specimen: Left Antecubital; Blood  ?Result Value Ref Range Status  ? Specimen Description LEFT ANTECUBITAL  Final  ? Special Requests   Final  ?  BOTTLES DRAWN AEROBIC AND ANAEROBIC Blood Culture adequate volume  ? Culture   Final  ?  NO GROWTH 5 DAYS ?Performed at [facility], [place] [address], [place], [place] [address] ?  ? Report Status 05/08/2021 FINAL  Final  ? ?  ? ? ? ? ? ?Radiology Studies: ?DG  CHEST PORT 1 VIEW ? ?Result Date: 05/07/2021 ?CLINICAL DATA:  Dyspnea, prior abnormal chest x-ray EXAM: PORTABLE CHEST 1 VIEW COMPARISON:  05/03/2021 FINDINGS: Single frontal view of the chest demonstrates a sta

## 2021-05-08 NOTE — TOC Progression Note (Signed)
Transition of Care (TOC) - Progression Note  ? ? ?Patient Details  ?Name: Brittany [name] ?MRN: [id number] ?Date of Birth: 08/27/1960 ? ?Transition of Care (TOC) CM/SW Contact  ?Geralynn Ochs, LCSW ?Phone Number: ?05/08/2021, 1:56 PM ? ?Clinical Narrative:   CSW following for discharge to SNF. Patient has now been without restraints for 72 hours. CSW confirmed bed availability at [facility] and submitted for insurance authorization after PT update from today. CSW spoke with daughter [name] to update on plan to admit to SNF, pending insurance approval. [name] appreciative of update. CSW to follow. ? ? ? ?Expected Discharge Plan: [place] ?Barriers to Discharge: Ship broker, Continued Medical Work up ? ?Expected Discharge Plan and Services ?Expected Discharge Plan: [place] ?  ?  ?  ?  ?                ?  ?  ?  ?  ?  ?  ?  ?  ?  ?  ? ? ?Social Determinants of Health (SDOH) Interventions ?  ? ?Readmission Risk Interventions ? ?  03/06/2021  ?  9:13 AM 02/16/2021  ?  2:45 PM  ?Readmission Risk Prevention Plan  ?Transportation Screening Complete Complete  ?Medication Review Press photographer) Complete Complete  ?PCP or Specialist appointment within 3-5 days of discharge Complete   ?SW Recovery Care/Counseling Consult  Complete  ?Palliative Care Screening Not Applicable Not Applicable  ?[place] Not Applicable Not Applicable  ? ? ?

## 2021-05-09 LAB — CBC WITH DIFFERENTIAL/PLATELET
Abs Immature Granulocytes: 0.39 10*3/uL — ABNORMAL HIGH (ref 0.00–0.07)
Basophils Absolute: 0.1 10*3/uL (ref 0.0–0.1)
Basophils Relative: 1 %
Eosinophils Absolute: 0.1 10*3/uL (ref 0.0–0.5)
Eosinophils Relative: 1 %
HCT: 32.8 % — ABNORMAL LOW (ref 36.0–46.0)
Hemoglobin: 10.1 g/dL — ABNORMAL LOW (ref 12.0–15.0)
Immature Granulocytes: 3 %
Lymphocytes Relative: 31 %
Lymphs Abs: 4.5 10*3/uL — ABNORMAL HIGH (ref 0.7–4.0)
MCH: 28.6 pg (ref 26.0–34.0)
MCHC: 30.8 g/dL (ref 30.0–36.0)
MCV: 92.9 fL (ref 80.0–100.0)
Monocytes Absolute: 1.3 10*3/uL — ABNORMAL HIGH (ref 0.1–1.0)
Monocytes Relative: 9 %
Neutro Abs: 8.4 10*3/uL — ABNORMAL HIGH (ref 1.7–7.7)
Neutrophils Relative %: 55 %
Platelets: 944 10*3/uL (ref 150–400)
RBC: 3.53 MIL/uL — ABNORMAL LOW (ref 3.87–5.11)
RDW: 15.5 % (ref 11.5–15.5)
WBC: 14.7 10*3/uL — ABNORMAL HIGH (ref 4.0–10.5)
nRBC: 0.2 % (ref 0.0–0.2)

## 2021-05-09 LAB — GLUCOSE, CAPILLARY
Glucose-Capillary: 128 mg/dL — ABNORMAL HIGH (ref 70–99)
Glucose-Capillary: 120 mg/dL — ABNORMAL HIGH (ref 70–99)
Glucose-Capillary: 277 mg/dL — ABNORMAL HIGH (ref 70–99)

## 2021-05-09 MED ORDER — INSULIN GLARGINE-YFGN 100 UNIT/ML ~~LOC~~ SOLN: 20.0000 [IU] | Freq: Two times a day (BID) | SUBCUTANEOUS | 11 refills | Status: DC

## 2021-05-09 MED ORDER — GABAPENTIN 100 MG PO CAPS: 200.0000 mg | ORAL_CAPSULE | Freq: Three times a day (TID) | ORAL | 3 refills | Status: DC

## 2021-05-09 MED ORDER — POLYETHYLENE GLYCOL 3350 17 G PO PACK
17.0000 g | PACK | Freq: Every day | ORAL | 0 refills | Status: DC | PRN
Start: 2021-05-09 — End: 2021-08-28

## 2021-05-09 MED ORDER — INSULIN ASPART 100 UNIT/ML IJ SOLN: INTRAMUSCULAR | 11 refills | Status: DC

## 2021-05-09 MED ORDER — DIVALPROEX SODIUM ER 250 MG PO TB24: 1250.0000 mg | ORAL_TABLET | Freq: Every day | ORAL | 0 refills | Status: DC

## 2021-05-09 MED ORDER — ADULT MULTIVITAMIN W/MINERALS CH: 1.0000 | ORAL_TABLET | Freq: Every day | ORAL | Status: DC

## 2021-05-09 MED ORDER — OLANZAPINE 7.5 MG PO TABS: 7.5000 mg | ORAL_TABLET | Freq: Every day | ORAL | 0 refills | Status: DC

## 2021-05-09 MED ORDER — MELATONIN 3 MG PO TABS: 6.0000 mg | ORAL_TABLET | Freq: Every day | ORAL | 0 refills | Status: DC

## 2021-05-09 MED ORDER — CAMPHOR-MENTHOL 0.5-0.5 % EX LOTN: TOPICAL_LOTION | CUTANEOUS | 0 refills | Status: DC | PRN

## 2021-05-09 MED ORDER — THIAMINE HCL 100 MG PO TABS: 100.0000 mg | ORAL_TABLET | Freq: Every day | ORAL | Status: DC

## 2021-05-09 MED ORDER — CLONAZEPAM 0.25 MG PO TBDP: 0.2500 mg | ORAL_TABLET | Freq: Two times a day (BID) | ORAL | 0 refills | Status: DC

## 2021-05-09 NOTE — Plan of Care (Signed)
?Problem: Education: ?Goal: Knowledge of General Education information will improve ?Description: Including pain rating scale, medication(s)/side effects and non-pharmacologic comfort measures ?05/09/2021 1710 by Emmaline Life, RN ?Outcome: Adequate for Discharge ?05/09/2021 1052 by Emmaline Life, RN ?Outcome: Progressing ?  ?Problem: Health Behavior/Discharge Planning: ?Goal: Ability to manage health-related needs will improve ?05/09/2021 1710 by Emmaline Life, RN ?Outcome: Adequate for Discharge ?05/09/2021 1052 by Emmaline Life, RN ?Outcome: Progressing ?  ?Problem: Clinical Measurements: ?Goal: Ability to maintain clinical measurements within normal limits will improve ?Outcome: Adequate for Discharge ?Goal: Will remain free from infection ?Outcome: Adequate for Discharge ?Goal: Diagnostic test results will improve ?Outcome: Adequate for Discharge ?Goal: Respiratory complications will improve ?Outcome: Adequate for Discharge ?Goal: Cardiovascular complication will be avoided ?Outcome: Adequate for Discharge ?  ?Problem: Activity: ?Goal: Risk for activity intolerance will decrease ?Outcome: Adequate for Discharge ?  ?Problem: Nutrition: ?Goal: Adequate nutrition will be maintained ?Outcome: Adequate for Discharge ?  ?Problem: Coping: ?Goal: Level of anxiety will decrease ?Outcome: Adequate for Discharge ?  ?Problem: Elimination: ?Goal: Will not experience complications related to bowel motility ?Outcome: Adequate for Discharge ?Goal: Will not experience complications related to urinary retention ?Outcome: Adequate for Discharge ?  ?Problem: Pain Managment: ?Goal: General experience of comfort will improve ?Outcome: Adequate for Discharge ?  ?Problem: Safety: ?Goal: Ability to remain free from injury will improve ?Outcome: Adequate for Discharge ?  ?Problem: Skin Integrity: ?Goal: Risk for impaired skin integrity will decrease ?Outcome: Adequate for Discharge ?  ?Problem: Safety: ?Goal: Non-violent  Restraint(s) ?Outcome: Adequate for Discharge ?  ?Problem: Malnutrition  (NI-5.2) ?Goal: Food and/or nutrient delivery ?Description: Individualized approach for food/nutrient provision. ?Outcome: Adequate for Discharge ?  ?Problem: SLP Cognition Goals ?Goal: Patient will demonstrate attention to functional ?Description: Patient will demonstrate attention to functional task with ?Outcome: Adequate for Discharge ?Goal: Patient will utilize external memory aids ?Description: Patient will utilize external memory aids to facilitate recall of information for improved safety with ?Outcome: Adequate for Discharge ?Goal: Patient will demonstrate problem solving skills ?Description: Patient will demonstrate problem solving skills during functional ADL's with ?Outcome: Adequate for Discharge ?Goal: Patient will demonstrate awareness during ?Description: Patient will demonstrate awareness during functional ADL for improved safety ?Outcome: Adequate for Discharge ?  ?Problem: SLP Cognition Goals ?Goal: Patient will demonstrate attention to functional ?Description: Patient will demonstrate attention to functional task with ?Outcome: Adequate for Discharge ?Goal: Patient will utilize external memory aids ?Description: Patient will utilize external memory aids to facilitate recall of information for improved safety with ?Outcome: Adequate for Discharge ?Goal: Patient will demonstrate problem solving skills ?Description: Patient will demonstrate problem solving skills during functional ADL's with ?Outcome: Adequate for Discharge ?Goal: Patient will demonstrate awareness during ?Description: Patient will demonstrate awareness during functional ADL for improved safety ?Outcome: Adequate for Discharge ?  ?Problem: Education: ?Goal: Knowledge of risk factors and measures for prevention of condition will improve ?Outcome: Adequate for Discharge ?  ?Problem: Coping: ?Goal: Psychosocial and spiritual needs will be supported ?Outcome:  Adequate for Discharge ?  ?Problem: Respiratory: ?Goal: Will maintain a patent airway ?Outcome: Adequate for Discharge ?Goal: Complications related to the disease process, condition or treatment will be avoided or minimized ?Outcome: Adequate for Discharge ?  ?Problem: Acute Rehab OT Goals (only OT should resolve) ?Goal: Pt. Will Perform Upper Body Dressing ?Outcome: Adequate for Discharge ?Goal: Pt. Will Perform Lower Body Dressing ?Outcome: Adequate for Discharge ?Goal: Pt. Will Transfer To Toilet ?Outcome: Adequate for Discharge ?Goal: OT Additional ADL  Goal #1 ?Outcome: Adequate for Discharge ?  ?Problem: Acute Rehab PT Goals(only PT should resolve) ?Goal: Pt Will Go Supine/Side To Sit ?Outcome: Adequate for Discharge ?Goal: Patient Will Transfer Sit To/From Stand ?Outcome: Adequate for Discharge ?Goal: Pt Will Ambulate ?Outcome: Adequate for Discharge ?  ?

## 2021-05-09 NOTE — Progress Notes (Signed)
Patient d/c to SNF, attempted  report x 2 no answer. Transported by [facility], discharge paperwork sent via [facility]. Patients daughter aware of d/c plan. ? ?Brittany [name], Brittany Ringer, RN  ?

## 2021-05-09 NOTE — TOC Transition Note (Signed)
Transition of Care (TOC) - CM/SW Discharge Note ? ? ?Patient Details  ?Name: Brittany [name] ?MRN: [id number] ?Date of Birth: [birth year] ? ?Transition of Care [facility]) CM/SW Contact:  ?Geralynn Ochs, LCSW ?Phone Number: ?05/09/2021, 2:39 PM ? ? ?Clinical Narrative:   CSW received insurance approval for patient to admit to [facility] today. CSW confirmed bed availability and notified daughter, [name], of plan to transfer to [facility] today. Transport scheduled with [facility] for next available. ? ?Nurse to call report to [phone], [place] Room 148 ? ? ? ?Final next level of care: [place] ?Barriers to Discharge: Barriers Resolved ? ? ?Patient Goals and CMS Choice ?  ?  ?  ? ?Discharge Placement ?  ?           ?[facility] ?Patient to be transferred to facility by: [facility] ?Name of family member notified: [name] ?Patient and family notified of of transfer: 05/09/21 ? ?Discharge Plan and Services ?  ?  ?           ?  ?  ?  ?  ?  ?  ?  ?  ?  ?  ? ?Social Determinants of Health (SDOH) Interventions ?  ? ? ?Readmission Risk Interventions ? ?  03/06/2021  ?  9:13 AM 02/16/2021  ?  2:45 PM  ?Readmission Risk Prevention Plan  ?Transportation Screening Complete Complete  ?Medication Review Press photographer) Complete Complete  ?PCP or Specialist appointment within 3-5 days of discharge Complete   ?SW Recovery Care/Counseling Consult  Complete  ?Palliative Care Screening Not Applicable Not Applicable  ?[place] Not Applicable Not Applicable  ? ? ? ? ? ?

## 2021-05-09 NOTE — Plan of Care (Signed)
  Problem: Education: Goal: Knowledge of General Education information will improve Description Including pain rating scale, medication(s)/side effects and non-pharmacologic comfort measures Outcome: Progressing   Problem: Health Behavior/Discharge Planning: Goal: Ability to manage health-related needs will improve Outcome: Progressing   

## 2021-05-09 NOTE — Discharge Summary (Addendum)
?Physician Discharge Summary ?  ?[name] MRN: [id number] DOB: December 26, 1960  ?Admit date:     04/05/2021  ?Discharge date: 05/09/2021  ?Discharge Physician: Hosie Poisson  ? ?PCP: [facility], [facility]  ? ?Recommendations at discharge:  ?Please follow up with PCP in one week.  ?Recommend palliative care consult at SNF.  ?Please follow up with psychiatry as needed.  ?Please check cbc and bmp in 1 to 2 days,  ?Please follow up with the hematologist if the platelet count do not improve.  ? ?Discharge Diagnoses: ?Principal Problem: ?  New onset seizure ([place]) ?Active Problems: ?  Acute metabolic encephalopathy ?  Sepsis ([place]) ?  DKA (diabetic ketoacidosis) ([place]) ?  Type 2 diabetes mellitus with hyperlipidemia ([place]) ?  Essential hypertension ?  Hypokalemia; hyperkalemia ?  Restless leg syndrome ?  Elevated troponin ?  History of stroke ?  Hypomagnesemia ?  COVID-19 virus infection ?  Status post colostomy [facility]) ?  COPD, mild ([place]) ?  Protein-calorie malnutrition, severe ?  Pressure injury of skin ?  Behavior related to cognitive impairment ? ?Resolved Problems: ?  Hyperkalemia ? ?Hospital Course: ?ASAIAH HUNNICUTT is a 61 year old female with PMH significant for hypertension, diabetes mellitus, CVA with right-sided weakness, COPD, tobacco abuse, and status post pancreatectomy and splenectomy, anxiety/depression, discoid lupus erythematosus, hyperlipidemia who presented to [facility] ED on 2/22 via EMS with confusion.   ? ?Per her daughter at baseline, patient is oriented x 3. She has a history of stroke with right-sided weakness. Normally pt can use a cane to walk. This AM, pt was found to be on the floor with confusion. Pt has jerking and slurred speech per her daughter.  ?  ?Per EDP, EMS reported that the house was extremely filthy. The patient appeared to be in a soiled diaper. Pt has an colostomy with no collection bag in place. Daughter did not know which medications the patient was on.  EMS states they  witnessed what could be a partial seizure, and gave the patient a total of 4 mg of intranasal Versed in route to the hospital. When I saw pt in ED. Pt was confused, not orientated x3. She has right-sided weakness which is likely from previous stroke.   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 Pt does not seem to have chest pain.  Not sure if patient has symptoms of UTI.  Her daughter is not sure if patient has a history of seizure.  Daughter states that she found a Keppra bottle which is about 62 years old, not sure if patient is taking his medications or not. ?  ?Patient was noted to have DKA, subclinical seizure on EEG and incidental finding of COVID-19 viral infection.  Neurology was consulted and patient was transferred to [facility] for continuous EEG.  Admitted to the hospital service.  Now DKA resolved.  Patient is off isolation of COVID-19.  Psychiatry consulted for agitation recommended Keppra should be changed to Depakote. ? ?Assessment and Plan: ? ?  ?New-onset seizures ?Probably secondary to lowered seizure threshold from DKA and COVID. ?Neurology consulted initially and transitioned Keppra to Depakote.  Patient currently on Depakote 500 mg twice daily. ?Neurology recommends outpatient follow-up on discharge. ?No seizures in the last 48 hours.  ?She is alert and answering simple questions.  ?  ?Sepsis probably secondary to aspiration pneumonia. ?Acute respiratory failure with hypoxia secondary to sedative medications ?Patient became more lethargic on 04/27/2021 with leukocytosis of 15.7,  fever of 102 and heart rate of 128 with chest x-ray showing opacities in the right middle and lower lobes consistent with infiltrate.  Was also found to have a UTI. ?CT of the chest abdomen pelvis remarkable for a right lower lobe pulmonary consolidation consistent with pneumonia. ?Lactic acid normalized ?Blood cultures negative so far.  Completed the course of IV antibiotics.   ?Afebrile overnight, but persistent leukocytosis.   ?CXR shows improvement in consolidation and repeat cbc shows improvement to 14,000.  ?CT abd and pelvis ordered, but pt refused to undergo the procedure and ripped the IV line.  ?  ?  ?  ?  ?Acute metabolic encephalopathy ?Waxing and waning secondary to delirium and infection and medications.  ?Completed the course of antibiotics.  ?We discontinued sitter on 3/25 and has been off sitter since then.  ?SNF when we can be off sitter for more that 48 hours.  ?  ?  ?DKA ?CBG (last 3)  ?Recent Labs  ?  05/08/21 ?1607 05/08/21 ?2205 05/09/21 ?0616  ?GLUCAP 166* 165* 128*  ? ? ?  ?  ?Type 2 diabetes mellitus with hyperlipidemia ?Insulin dependent.  ?Uncontrolled with hyperglycemia.  ?Continue with Semglee and SSI and Novolog.  ? ? ?  ?Essential hypertension ?Blood pressure parameters are well controlled.  ?  ?  ?Hyperkalemia:  ?Resolved.  ?  ?  ?  ?S/p colostomy:  ?Wound care consulted.  ?  ?  ?COPD:   ?No wheezing heard. On RA.  ?  ?  ?  ?Severe protein calorie malnutrition:  ?On supplementation. ?Pt is also lactose intolerant.   ?  ?  ? Cognitive impairment  with behavioral abnormalities.  ?Currently on depakote 1250 mg at bedtime, along with clonazepam 0.25 mg BID, zyprexa .  ?  ?  ?Anemia of chronic disease:  ?Hemoglobin stable around 9.7 to 9.8.  ?  ?  ?  ?Thrombocytosis:  ?Probably reactive. recommend checking CBC in one week.  ?  ?  ? ? ? ?Consultants: psychiatry.  ?Procedures performed: none.   ?Disposition: Skilled nursing facility ?Diet recommendation:  ?Discharge Diet Orders (From admission, onward)  ? ?  Start     Ordered  ? 05/09/21 0000  Diet - low sodium heart healthy       ? 05/09/21 1016  ? ?  ?  ? ?  ? ?Regular diet ?DISCHARGE MEDICATION: ?Allergies as of 05/09/2021   ? ?   Reactions  ? Cephalosporins Itching  ? TOLERATED ZOSYN (PIPERACILLIN) BEFORE  ? Hydromorphone Itching  ? Keflin [cephalothin] Itching  ? Lactose Intolerance (gi) Diarrhea  ? ?  ? ?   ?Medication List  ?  ? ?STOP taking these medications   ? ?benazepril 10 MG tablet ?Commonly known as: LOTENSIN ?  ?feeding supplement (GLUCERNA SHAKE) Liqd ?  ?Gabapentin 50 MG Tabs ?Replaced by: gabapentin 100 MG capsule ?  ?mirtazapine 15 MG tablet ?Commonly known as: REMERON ?  ?NovoLIN 70/30 (70-30) 100 UNIT/ML injection ?Generic drug: insulin NPH-regular Human ?  ?ondansetron 4 MG disintegrating tablet ?Commonly known as: ZOFRAN-ODT ?  ?oxyCODONE 5 MG immediate release tablet ?Commonly known as: Oxy IR/ROXICODONE ?  ?potassium chloride SA 20 MEQ tablet ?Commonly known as: KLOR-CON M ?  ? ?  ? ?TAKE these medications   ? ?atorvastatin 40 MG tablet ?Commonly known as: LIPITOR ?Take 1 tablet (40 mg total) by mouth Nightly. ?  ?camphor-menthol lotion ?Commonly known as: SARNA ?Apply topically as needed for itching. ?  ?clonazePAM  0.25 MG disintegrating tablet ?Commonly known as: KLONOPIN ?Take 1 tablet (0.25 mg total) by mouth 2 (two) times daily. ?  ?diclofenac Sodium 1 % Gel ?Commonly known as: VOLTAREN ?Apply 2 g topically 4 (four) times daily. ?  ?divalproex 250 MG 24 hr tablet ?Commonly known as: DEPAKOTE ER ?Take 5 tablets (1,250 mg total) by mouth at bedtime. ?  ?gabapentin 100 MG capsule ?Commonly known as: NEURONTIN ?Take 2 capsules (200 mg total) by mouth 3 (three) times daily. ?Replaces: Gabapentin 50 MG Tabs ?  ?insulin aspart 100 UNIT/ML injection ?Commonly known as: novoLOG ?CBG 70 - 120: 0 units ? CBG 121 - 150: 0 units ? CBG 151 - 200: 0 units ? CBG 201 - 250: 2 units  ?CBG 251 - 300: 3 units  ?CBG 301 - 350: 4 units  ?CBG 351 - 400: 5 units ?  ?insulin glargine-yfgn 100 UNIT/ML injection ?Commonly known as: SEMGLEE ?Inject 0.2 mLs (20 Units total) into the skin 2 (two) times daily. ?  ?liver oil-zinc oxide 40 % ointment ?Commonly known as: DESITIN ?Apply topically as needed for irritation. ?  ?melatonin 3 MG Tabs tablet ?Take 2 tablets (6 mg total) by mouth at bedtime. ?  ?multivitamin with  minerals Tabs tablet ?Take 1 tablet by mouth daily. ?Start taking on: May 10, 2021 ?  ?OLANZapine 7.5 MG tablet ?Commonly known as: ZYPREXA ?Take 1 tablet (7.5 mg total) by mouth at bedtime. ?  ?Pancrelipase (Lip-P

## 2021-05-09 NOTE — Progress Notes (Signed)
Occupational Therapy Treatment ?Patient Details ?Name: Brittany [name] ?MRN: [id number] ?DOB: 1961-02-09 ?Today's Date: 05/09/2021 ? ? ?History of present illness Brittany [name] is a 5yoF who was brought to the ED for confusion plus DKA.  EEG noted subclinical seizure.  Also found to have incidental COVID. 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 Pt admitted with diverticulitis of intestine with abscess. ?  ?OT comments ? Patient making good progress with OT treatment. Patient able to stand at sink and perform grooming tasks with min guard and doff and donn footwear with supervision. Patient instructed in 3 step commands and was able to follow 75% without cueing. Patient performed static standing balance activities to increase safety with self care. Patient is expected to discharge to SNF for continued rehab.   ? ?Recommendations for follow up therapy are one component of a multi-disciplinary discharge planning process, led by the attending physician.  Recommendations may be updated based on patient status, additional functional criteria and insurance authorization. ?   ?Follow Up Recommendations ? Skilled nursing-short term rehab (<3 hours/day)  ?  ?Assistance Recommended at Discharge Frequent or constant Supervision/Assistance  ?Patient can return home with the following ? A little help with walking and/or transfers;A little help with bathing/dressing/bathroom;Assistance with cooking/housework;Assistance with feeding;Direct supervision/assist for medications management;Direct supervision/assist for financial management;Assist for transportation;Help with stairs or ramp for entrance ?  ?Equipment Recommendations ? None recommended by OT  ?  ?Recommendations for Other Services   ? ?  ?Precautions / Restrictions Precautions ?Precautions: Fall ?Precaution Comments: seizure, urinary incontinence,  colostomy bag frequently comes off ?Restrictions ?Weight Bearing Restrictions: No  ? ? ?  ? ?Mobility Bed Mobility ?Overal bed mobility: Modified Independent ?  ?  ?  ?  ?  ?  ?General bed mobility comments: up in chair ?  ? ?Transfers ?Overall transfer level: Needs assistance ?Equipment used: None ?Transfers: Sit to/from Stand ?Sit to Stand: Min guard ?  ?  ?  ?  ?  ?General transfer comment: min guard for safety ?  ?  ?Balance Overall balance assessment: Needs assistance ?Sitting-balance support: No upper extremity supported, Feet supported ?Sitting balance-Leahy Scale: Good ?  ?  ?Standing balance support: Single extremity supported, Bilateral upper extremity supported, During functional activity ?Standing balance-Leahy Scale: Poor ?Standing balance comment: able to stand at sink with min guard ?  ?  ?  ?  ?  ?  ?  ?  ?  ?  ?  ?   ? ?ADL either performed or assessed with clinical judgement  ? ?ADL Overall ADL's : Needs assistance/impaired ?  ?  ?Grooming: Wash/dry hands;Wash/dry face;Oral care;Min guard;Standing ?Grooming Details (indicate cue type and reason): completed at sink ?  ?  ?  ?  ?  ?  ?Lower Body Dressing: Supervision/safety;Sitting/lateral leans ?Lower Body Dressing Details (indicate cue type and reason): able to doff and donn socks ?Toilet Transfer: Min guard;Ambulation ?Toilet Transfer Details (indicate cue type and reason): able to travel long distance ?  ?  ?  ?  ?  ?General ADL Comments: provided patient with 3 step commands and patient was able to follow 75% of time ?  ? ?Extremity/Trunk Assessment Upper Extremity Assessment ?RUE Deficits / Details: WFL ROM (less shoulder ROM than left), 4-/5 shoulder strength, elbow strength 4/5, wrist 5/5, grip 4/5 - using functionally for self feeding and grooming ?RUE Sensation:  decreased light touch ?RUE Coordination: decreased fine motor;decreased gross motor ?LUE Deficits / Details: WFL ROM, 5/5 strength ?LUE Sensation: WNL ?LUE Coordination: WNL ?  ?  ?   ?  ?  ? ?Vision   ?  ?  ?Perception   ?  ?Praxis   ?  ? ?Cognition Arousal/Alertness: Awake/alert ?Behavior During Therapy: Restless ?Overall Cognitive Status: Impaired/Different from baseline ?  ?  ?  ?  ?  ?  ?  ?  ?  ?Orientation Level: Disoriented to, Time, Place ?Current Attention Level: Sustained ?Memory: Decreased recall of precautions, Decreased short-term memory ?Following Commands: Follows one step commands with increased time ?Safety/Judgement: Decreased awareness of safety, Decreased awareness of deficits ?Awareness: Intellectual ?Problem Solving: Requires verbal cues, Requires tactile cues, Slow processing ?General Comments: slow processing, able to follow 3 step commands ?  ?  ?   ?Exercises Exercises: Other exercises ?Other Exercises ?Other Exercises: standing balance activities performed with min guard ? ?  ?Shoulder Instructions   ? ? ?  ?General Comments    ? ? ?Pertinent Vitals/ Pain       Pain Assessment ?Pain Assessment: No/denies pain ?Pain Intervention(s): Limited activity within patient's tolerance ? ?Home Living   ?  ?  ?  ?  ?  ?  ?  ?  ?  ?  ?  ?  ?  ?  ?  ?  ?  ?  ? ?  ?Prior Functioning/Environment    ?  ?  ?  ?   ? ?Frequency ? Min 2X/week  ? ? ? ? ?  ?Progress Toward Goals ? ?OT Goals(current goals can now be found in the care plan section) ? Progress towards OT goals: Progressing toward goals ? ?Acute Rehab OT Goals ?Patient Stated Goal: get better ?OT Goal Formulation: With patient ?Time For Goal Achievement: 05/17/21 ?Potential to Achieve Goals: Good ?ADL Goals ?Pt Will Perform Upper Body Dressing: Independently;sitting ?Pt Will Perform Lower Body Dressing: with modified independence;sit to/from stand ?Pt Will Transfer to Toilet: with modified independence;ambulating ?Additional ADL Goal #1: pt will indep follow 3 step directional task as a precursor to safe ADLs  ?Plan Discharge plan remains appropriate   ? ?Co-evaluation ? ? ?   ?  ?  ?  ?  ? ?  ?AM-PAC OT "6 Clicks" Daily  Activity     ?Outcome Measure ? ? Help from another person eating meals?: A Little ?Help from another person taking care of personal grooming?: A Little ?Help from another person toileting, which includes using toliet, bedpan, or urinal?: A Little ?Help from another person bathing (including washing, rinsing, drying)?: A Little ?Help from another person to put on and taking off regular upper body clothing?: A Little ?Help from another person to put on and taking off regular lower body clothing?: A Little ?6 Click Score: 18 ? ?  ?End of Session Equipment Utilized During Treatment: Gait belt ? ?OT Visit Diagnosis: Unsteadiness on feet (R26.81);Other abnormalities of gait and mobility (R26.89);Muscle weakness (generalized) (M62.81) ?  ?Activity Tolerance Patient tolerated treatment well ?  ?Patient Left in chair;with nursing/sitter in room;Other (comment) (at nursing station) ?  ?Nurse Communication Mobility status ?  ? ?   ? ?Time: 1020-1050 ?OT Time Calculation (min): 30 min ? ?Charges: OT General Charges ?$OT Visit: 1 Visit ?OT Treatments ?$Self Care/Home Management : 8-22 mins ?$Therapeutic Activity: 8-22 mins ? ?Lodema Hong, OTA ?Acute Rehabilitation Services  ?Pager [phone] ?Office [phone] ? ? ?[place] ?  05/09/2021, 11:55 AM ?

## 2021-05-15 ENCOUNTER — Emergency Department (HOSPITAL_COMMUNITY): Payer: Medicare Other

## 2021-05-15 ENCOUNTER — Inpatient Hospital Stay (HOSPITAL_COMMUNITY)
Admission: EM | Admit: 2021-05-15 | Discharge: 2021-05-23 | DRG: 871 | Disposition: A | Discharge status: Home Health | Payer: Medicare Other | Source: Skilled Nursing Facility | Attending: Internal Medicine | Admitting: Internal Medicine

## 2021-05-15 ENCOUNTER — Other Ambulatory Visit: Payer: Self-pay

## 2021-05-15 ENCOUNTER — Encounter (HOSPITAL_COMMUNITY): Payer: Self-pay | Admitting: *Deleted

## 2021-05-15 DIAGNOSIS — Z9081 Acquired absence of spleen: Secondary | ICD-10-CM

## 2021-05-15 DIAGNOSIS — G9341 Metabolic encephalopathy: Secondary | ICD-10-CM | POA: Diagnosis present

## 2021-05-15 DIAGNOSIS — Z885 Allergy status to narcotic agent status: Secondary | ICD-10-CM

## 2021-05-15 DIAGNOSIS — E43 Unspecified severe protein-calorie malnutrition: Secondary | ICD-10-CM | POA: Diagnosis present

## 2021-05-15 DIAGNOSIS — D136 Benign neoplasm of pancreas: Secondary | ICD-10-CM | POA: Diagnosis present

## 2021-05-15 DIAGNOSIS — Z681 Body mass index (BMI) 19 or less, adult: Secondary | ICD-10-CM

## 2021-05-15 DIAGNOSIS — E876 Hypokalemia: Secondary | ICD-10-CM | POA: Diagnosis present

## 2021-05-15 DIAGNOSIS — J440 Chronic obstructive pulmonary disease with acute lower respiratory infection: Secondary | ICD-10-CM | POA: Diagnosis present

## 2021-05-15 DIAGNOSIS — G40909 Epilepsy, unspecified, not intractable, without status epilepticus: Secondary | ICD-10-CM | POA: Diagnosis present

## 2021-05-15 DIAGNOSIS — R6520 Severe sepsis without septic shock: Secondary | ICD-10-CM | POA: Diagnosis present

## 2021-05-15 DIAGNOSIS — G2581 Restless legs syndrome: Secondary | ICD-10-CM | POA: Diagnosis present

## 2021-05-15 DIAGNOSIS — E162 Hypoglycemia, unspecified: Secondary | ICD-10-CM

## 2021-05-15 DIAGNOSIS — Z87891 Personal history of nicotine dependence: Secondary | ICD-10-CM

## 2021-05-15 DIAGNOSIS — J9600 Acute respiratory failure, unspecified whether with hypoxia or hypercapnia: Secondary | ICD-10-CM | POA: Diagnosis present

## 2021-05-15 DIAGNOSIS — R4182 Altered mental status, unspecified: Secondary | ICD-10-CM | POA: Diagnosis not present

## 2021-05-15 DIAGNOSIS — Z8673 Personal history of transient ischemic attack (TIA), and cerebral infarction without residual deficits: Secondary | ICD-10-CM

## 2021-05-15 DIAGNOSIS — D490 Neoplasm of unspecified behavior of digestive system: Secondary | ICD-10-CM | POA: Diagnosis present

## 2021-05-15 DIAGNOSIS — Z818 Family history of other mental and behavioral disorders: Secondary | ICD-10-CM

## 2021-05-15 DIAGNOSIS — I10 Essential (primary) hypertension: Secondary | ICD-10-CM | POA: Diagnosis present

## 2021-05-15 DIAGNOSIS — E440 Moderate protein-calorie malnutrition: Secondary | ICD-10-CM | POA: Diagnosis present

## 2021-05-15 DIAGNOSIS — D638 Anemia in other chronic diseases classified elsewhere: Secondary | ICD-10-CM | POA: Diagnosis not present

## 2021-05-15 DIAGNOSIS — Z8616 Personal history of COVID-19: Secondary | ICD-10-CM | POA: Diagnosis not present

## 2021-05-15 DIAGNOSIS — Z794 Long term (current) use of insulin: Secondary | ICD-10-CM

## 2021-05-15 DIAGNOSIS — E119 Type 2 diabetes mellitus without complications: Secondary | ICD-10-CM

## 2021-05-15 DIAGNOSIS — F32A Depression, unspecified: Secondary | ICD-10-CM | POA: Diagnosis present

## 2021-05-15 DIAGNOSIS — J449 Chronic obstructive pulmonary disease, unspecified: Secondary | ICD-10-CM | POA: Diagnosis present

## 2021-05-15 DIAGNOSIS — J188 Other pneumonia, unspecified organism: Secondary | ICD-10-CM | POA: Diagnosis present

## 2021-05-15 DIAGNOSIS — Z8507 Personal history of malignant neoplasm of pancreas: Secondary | ICD-10-CM

## 2021-05-15 DIAGNOSIS — L89321 Pressure ulcer of left buttock, stage 1: Secondary | ICD-10-CM | POA: Diagnosis present

## 2021-05-15 DIAGNOSIS — L8990 Pressure ulcer of unspecified site, unspecified stage: Secondary | ICD-10-CM | POA: Diagnosis present

## 2021-05-15 DIAGNOSIS — Z8701 Personal history of pneumonia (recurrent): Secondary | ICD-10-CM

## 2021-05-15 DIAGNOSIS — I69351 Hemiplegia and hemiparesis following cerebral infarction affecting right dominant side: Secondary | ICD-10-CM

## 2021-05-15 DIAGNOSIS — F419 Anxiety disorder, unspecified: Secondary | ICD-10-CM | POA: Diagnosis present

## 2021-05-15 DIAGNOSIS — J9601 Acute respiratory failure with hypoxia: Secondary | ICD-10-CM | POA: Diagnosis present

## 2021-05-15 DIAGNOSIS — Z79899 Other long term (current) drug therapy: Secondary | ICD-10-CM

## 2021-05-15 DIAGNOSIS — Z7189 Other specified counseling: Secondary | ICD-10-CM

## 2021-05-15 DIAGNOSIS — L930 Discoid lupus erythematosus: Secondary | ICD-10-CM | POA: Diagnosis present

## 2021-05-15 DIAGNOSIS — Z9981 Dependence on supplemental oxygen: Secondary | ICD-10-CM | POA: Diagnosis not present

## 2021-05-15 DIAGNOSIS — Z91011 Allergy to milk products: Secondary | ICD-10-CM

## 2021-05-15 DIAGNOSIS — A419 Sepsis, unspecified organism: Secondary | ICD-10-CM | POA: Diagnosis present

## 2021-05-15 DIAGNOSIS — Z881 Allergy status to other antibiotic agents status: Secondary | ICD-10-CM

## 2021-05-15 DIAGNOSIS — Z933 Colostomy status: Secondary | ICD-10-CM

## 2021-05-15 DIAGNOSIS — L89311 Pressure ulcer of right buttock, stage 1: Secondary | ICD-10-CM | POA: Diagnosis present

## 2021-05-15 DIAGNOSIS — J189 Pneumonia, unspecified organism: Secondary | ICD-10-CM | POA: Diagnosis present

## 2021-05-15 DIAGNOSIS — R1310 Dysphagia, unspecified: Secondary | ICD-10-CM | POA: Diagnosis present

## 2021-05-15 DIAGNOSIS — E785 Hyperlipidemia, unspecified: Secondary | ICD-10-CM | POA: Diagnosis present

## 2021-05-15 DIAGNOSIS — E872 Acidosis, unspecified: Secondary | ICD-10-CM | POA: Diagnosis present

## 2021-05-15 DIAGNOSIS — E11649 Type 2 diabetes mellitus with hypoglycemia without coma: Secondary | ICD-10-CM | POA: Diagnosis present

## 2021-05-15 DIAGNOSIS — R569 Unspecified convulsions: Secondary | ICD-10-CM

## 2021-05-15 HISTORY — DX: Colostomy status: Z93.3

## 2021-05-15 HISTORY — DX: Malignant neoplasm of pancreas, unspecified: C25.9

## 2021-05-15 HISTORY — DX: Epilepsy, unspecified, not intractable, without status epilepticus: G40.909

## 2021-05-15 HISTORY — DX: Hyperlipidemia, unspecified: E78.5

## 2021-05-15 HISTORY — DX: Restless legs syndrome: G25.81

## 2021-05-15 LAB — CBC WITH DIFFERENTIAL/PLATELET
Abs Immature Granulocytes: 0.69 10*3/uL — ABNORMAL HIGH (ref 0.00–0.07)
Basophils Absolute: 0.1 10*3/uL (ref 0.0–0.1)
Basophils Relative: 0 %
Eosinophils Absolute: 0 10*3/uL (ref 0.0–0.5)
Eosinophils Relative: 0 %
HCT: 29 % — ABNORMAL LOW (ref 36.0–46.0)
Hemoglobin: 8.9 g/dL — ABNORMAL LOW (ref 12.0–15.0)
Immature Granulocytes: 2 %
Lymphocytes Relative: 11 %
Lymphs Abs: 4.5 10*3/uL — ABNORMAL HIGH (ref 0.7–4.0)
MCH: 29.2 pg (ref 26.0–34.0)
MCHC: 30.7 g/dL (ref 30.0–36.0)
MCV: 95.1 fL (ref 80.0–100.0)
Monocytes Absolute: 1.8 10*3/uL — ABNORMAL HIGH (ref 0.1–1.0)
Monocytes Relative: 4 %
Neutro Abs: 34.4 10*3/uL — ABNORMAL HIGH (ref 1.7–7.7)
Neutrophils Relative %: 83 %
Platelets: 636 10*3/uL — ABNORMAL HIGH (ref 150–400)
RBC: 3.05 MIL/uL — ABNORMAL LOW (ref 3.87–5.11)
RDW: 17.8 % — ABNORMAL HIGH (ref 11.5–15.5)
WBC: 41.5 10*3/uL — ABNORMAL HIGH (ref 4.0–10.5)
nRBC: 0.1 % (ref 0.0–0.2)

## 2021-05-15 LAB — STREP PNEUMONIAE URINARY ANTIGEN: Strep Pneumo Urinary Antigen: NEGATIVE

## 2021-05-15 LAB — COMPREHENSIVE METABOLIC PANEL
ALT: 14 U/L (ref 0–44)
AST: 27 U/L (ref 15–41)
Albumin: 2 g/dL — ABNORMAL LOW (ref 3.5–5.0)
Alkaline Phosphatase: 73 U/L (ref 38–126)
Anion gap: 9 (ref 5–15)
BUN: 14 mg/dL (ref 8–23)
CO2: 21 mmol/L — ABNORMAL LOW (ref 22–32)
Calcium: 8.5 mg/dL — ABNORMAL LOW (ref 8.9–10.3)
Chloride: 115 mmol/L — ABNORMAL HIGH (ref 98–111)
Creatinine, Ser: 0.81 mg/dL (ref 0.44–1.00)
GFR, Estimated: >60 mL/min (ref >60)
Glucose, Bld: 76 mg/dL (ref 70–99)
Potassium: 4.4 mmol/L (ref 3.5–5.1)
Sodium: 145 mmol/L (ref 135–145)
Total Bilirubin: 0.5 mg/dL (ref 0.3–1.2)
Total Protein: 7.4 g/dL (ref 6.5–8.1)

## 2021-05-15 LAB — I-STAT ARTERIAL BLOOD GAS, ED
Acid-base deficit: 2 mmol/L (ref 0.0–2.0)
Bicarbonate: 20.9 mmol/L (ref 20.0–28.0)
Calcium, Ion: 1.21 mmol/L (ref 1.15–1.40)
HCT: 27 % — ABNORMAL LOW (ref 36.0–46.0)
Hemoglobin: 9.2 g/dL — ABNORMAL LOW (ref 12.0–15.0)
O2 Saturation: 84 %
Patient temperature: 100.3
Potassium: 4 mmol/L (ref 3.5–5.1)
Sodium: 147 mmol/L — ABNORMAL HIGH (ref 135–145)
TCO2: 22 mmol/L (ref 22–32)
pCO2 arterial: 30.5 mmHg — ABNORMAL LOW (ref 32–48)
pH, Arterial: 7.448 (ref 7.35–7.45)
pO2, Arterial: 48 mmHg — ABNORMAL LOW (ref 83–108)

## 2021-05-15 LAB — URINALYSIS, ROUTINE W REFLEX MICROSCOPIC
Bilirubin Urine: NEGATIVE
Glucose, UA: NEGATIVE mg/dL
Hgb urine dipstick: NEGATIVE
Ketones, ur: NEGATIVE mg/dL
Leukocytes,Ua: NEGATIVE
Nitrite: NEGATIVE
Protein, ur: NEGATIVE mg/dL
Specific Gravity, Urine: 1.021 (ref 1.005–1.030)
pH: 5 (ref 5.0–8.0)

## 2021-05-15 LAB — APTT: aPTT: 31 seconds (ref 24–36)

## 2021-05-15 LAB — CBG MONITORING, ED
Glucose-Capillary: 67 mg/dL — ABNORMAL LOW (ref 70–99)
Glucose-Capillary: 129 mg/dL — ABNORMAL HIGH (ref 70–99)

## 2021-05-15 LAB — LACTIC ACID, PLASMA
Lactic Acid, Venous: 3.1 mmol/L (ref 0.5–1.9)
Lactic Acid, Venous: 3.5 mmol/L (ref 0.5–1.9)
Lactic Acid, Venous: 3.9 mmol/L (ref 0.5–1.9)

## 2021-05-15 LAB — PROTIME-INR
INR: 1.3 — ABNORMAL HIGH (ref 0.8–1.2)
Prothrombin Time: 16.5 seconds — ABNORMAL HIGH (ref 11.4–15.2)

## 2021-05-15 LAB — VALPROIC ACID LEVEL: Valproic Acid Lvl: 28 ug/mL — ABNORMAL LOW (ref 50.0–100.0)

## 2021-05-15 LAB — MRSA NEXT GEN BY PCR, NASAL: MRSA by PCR Next Gen: NOT DETECTED

## 2021-05-15 MED ORDER — DEXTROSE 50 % IV SOLN
25.0000 mL | Freq: Once | INTRAVENOUS | Status: AC
  Administered 2021-05-15: 25 mL via INTRAVENOUS
  Filled 2021-05-15: qty 50

## 2021-05-15 MED ORDER — LACTATED RINGERS IV SOLN: INTRAVENOUS | Status: DC

## 2021-05-15 MED ORDER — DOCUSATE SODIUM 100 MG PO CAPS
100.0000 mg | ORAL_CAPSULE | Freq: Two times a day (BID) | ORAL | Status: DC
  Administered 2021-05-16 – 2021-05-23 (×13): 100 mg via ORAL
  Filled 2021-05-15 (×13): qty 1

## 2021-05-15 MED ORDER — ONDANSETRON HCL 4 MG/2ML IJ SOLN: 4.0000 mg | Freq: Four times a day (QID) | INTRAMUSCULAR | Status: DC | PRN

## 2021-05-15 MED ORDER — VANCOMYCIN HCL IN DEXTROSE 1-5 GM/200ML-% IV SOLN
1000.0000 mg | Freq: Once | INTRAVENOUS | Status: AC
  Administered 2021-05-15: 1000 mg via INTRAVENOUS
  Filled 2021-05-15: qty 200

## 2021-05-15 MED ORDER — ALBUTEROL SULFATE (2.5 MG/3ML) 0.083% IN NEBU
2.5000 mg | INHALATION_SOLUTION | RESPIRATORY_TRACT | Status: DC | PRN
Start: 2021-05-15 — End: 2021-05-23

## 2021-05-15 MED ORDER — SODIUM CHLORIDE 0.9% FLUSH
3.0000 mL | Freq: Two times a day (BID) | INTRAVENOUS | Status: DC
  Administered 2021-05-16 – 2021-05-23 (×13): 3 mL via INTRAVENOUS

## 2021-05-15 MED ORDER — ACETAMINOPHEN 325 MG PO TABS: 650.0000 mg | ORAL_TABLET | Freq: Four times a day (QID) | ORAL | Status: DC | PRN

## 2021-05-15 MED ORDER — SODIUM CHLORIDE 0.9 % IV SOLN
2.0000 g | Freq: Once | INTRAVENOUS | Status: DC
  Filled 2021-05-15: qty 2

## 2021-05-15 MED ORDER — BISACODYL 5 MG PO TBEC: 5.0000 mg | DELAYED_RELEASE_TABLET | Freq: Every day | ORAL | Status: DC | PRN

## 2021-05-15 MED ORDER — OXYCODONE HCL 5 MG PO TABS
5.0000 mg | ORAL_TABLET | ORAL | Status: DC | PRN
  Administered 2021-05-21: 5 mg via ORAL
  Filled 2021-05-15: qty 1

## 2021-05-15 MED ORDER — POLYETHYLENE GLYCOL 3350 17 G PO PACK: 17.0000 g | PACK | Freq: Every day | ORAL | Status: DC | PRN

## 2021-05-15 MED ORDER — ACETAMINOPHEN 650 MG RE SUPP: 650.0000 mg | Freq: Four times a day (QID) | RECTAL | Status: DC | PRN

## 2021-05-15 MED ORDER — DIVALPROEX SODIUM 250 MG PO DR TAB: 1250.0000 mg | DELAYED_RELEASE_TABLET | Freq: Every day | ORAL | Status: DC

## 2021-05-15 MED ORDER — POLYVINYL ALCOHOL 1.4 % OP SOLN
1.0000 [drp] | Freq: Three times a day (TID) | OPHTHALMIC | Status: DC
  Administered 2021-05-15 – 2021-05-23 (×21): 1 [drp] via OPHTHALMIC
  Filled 2021-05-15 (×2): qty 15

## 2021-05-15 MED ORDER — ROPINIROLE HCL 1 MG PO TABS
1.0000 mg | ORAL_TABLET | Freq: Two times a day (BID) | ORAL | Status: DC
  Administered 2021-05-16 – 2021-05-23 (×14): 1 mg via ORAL
  Filled 2021-05-15 (×17): qty 1

## 2021-05-15 MED ORDER — GLYCERIN-HYPROMELLOSE-PEG 400 0.2-0.2-1 % OP SOLN: 1.0000 [drp] | Freq: Three times a day (TID) | OPHTHALMIC | Status: DC

## 2021-05-15 MED ORDER — METRONIDAZOLE 500 MG/100ML IV SOLN
500.0000 mg | Freq: Once | INTRAVENOUS | Status: DC
  Filled 2021-05-15: qty 100

## 2021-05-15 MED ORDER — VANCOMYCIN HCL 750 MG/150ML IV SOLN
750.0000 mg | INTRAVENOUS | Status: DC
  Filled 2021-05-15: qty 150

## 2021-05-15 MED ORDER — CLONAZEPAM 0.125 MG PO TBDP: 0.2500 mg | ORAL_TABLET | Freq: Two times a day (BID) | ORAL | Status: DC

## 2021-05-15 MED ORDER — ONDANSETRON HCL 4 MG PO TABS
4.0000 mg | ORAL_TABLET | Freq: Four times a day (QID) | ORAL | Status: DC | PRN
Start: 2021-05-15 — End: 2021-05-23

## 2021-05-15 MED ORDER — MELATONIN 3 MG PO TABS
6.0000 mg | ORAL_TABLET | Freq: Every day | ORAL | Status: DC
  Administered 2021-05-16 – 2021-05-22 (×7): 6 mg via ORAL
  Filled 2021-05-15 (×7): qty 2

## 2021-05-15 MED ORDER — LACTATED RINGERS IV BOLUS (SEPSIS)
500.0000 mL | Freq: Once | INTRAVENOUS | Status: AC
  Administered 2021-05-15: 500 mL via INTRAVENOUS

## 2021-05-15 MED ORDER — GABAPENTIN 100 MG PO CAPS
200.0000 mg | ORAL_CAPSULE | Freq: Three times a day (TID) | ORAL | Status: DC
  Administered 2021-05-16 – 2021-05-23 (×20): 200 mg via ORAL
  Filled 2021-05-15 (×21): qty 2

## 2021-05-15 MED ORDER — INSULIN ASPART 100 UNIT/ML IJ SOLN
0.0000 [IU] | Freq: Every day | INTRAMUSCULAR | Status: DC
  Administered 2021-05-18: 2 [IU] via SUBCUTANEOUS
  Administered 2021-05-20 – 2021-05-21 (×2): 3 [IU] via SUBCUTANEOUS
  Administered 2021-05-22: 5 [IU] via SUBCUTANEOUS

## 2021-05-15 MED ORDER — PIPERACILLIN-TAZOBACTAM 3.375 G IVPB
3.3750 g | Freq: Three times a day (TID) | INTRAVENOUS | Status: DC
  Administered 2021-05-15 – 2021-05-22 (×21): 3.375 g via INTRAVENOUS
  Filled 2021-05-15 (×21): qty 50

## 2021-05-15 MED ORDER — LACTATED RINGERS IV BOLUS (SEPSIS)
1000.0000 mL | Freq: Once | INTRAVENOUS | Status: AC
  Administered 2021-05-15: 1000 mL via INTRAVENOUS

## 2021-05-15 MED ORDER — INSULIN ASPART 100 UNIT/ML IJ SOLN
0.0000 [IU] | Freq: Three times a day (TID) | INTRAMUSCULAR | Status: DC
  Administered 2021-05-16: 1 [IU] via SUBCUTANEOUS
  Administered 2021-05-16 (×2): 2 [IU] via SUBCUTANEOUS
  Administered 2021-05-17: 5 [IU] via SUBCUTANEOUS
  Administered 2021-05-17: 1 [IU] via SUBCUTANEOUS
  Administered 2021-05-17: 3 [IU] via SUBCUTANEOUS
  Administered 2021-05-18 (×2): 2 [IU] via SUBCUTANEOUS
  Administered 2021-05-18: 3 [IU] via SUBCUTANEOUS
  Administered 2021-05-19: 5 [IU] via SUBCUTANEOUS
  Administered 2021-05-20: 3 [IU] via SUBCUTANEOUS
  Administered 2021-05-20 (×2): 2 [IU] via SUBCUTANEOUS
  Administered 2021-05-21: 9 [IU] via SUBCUTANEOUS
  Administered 2021-05-21: 2 [IU] via SUBCUTANEOUS
  Administered 2021-05-21: 5 [IU] via SUBCUTANEOUS
  Administered 2021-05-22 (×3): 2 [IU] via SUBCUTANEOUS
  Administered 2021-05-23: 9 [IU] via SUBCUTANEOUS
  Administered 2021-05-23: 1 [IU] via SUBCUTANEOUS

## 2021-05-15 MED ORDER — OLANZAPINE 2.5 MG PO TABS
7.5000 mg | ORAL_TABLET | Freq: Every day | ORAL | Status: DC
  Administered 2021-05-16 – 2021-05-22 (×7): 7.5 mg via ORAL
  Filled 2021-05-15 (×6): qty 3
  Filled 2021-05-15: qty 1
  Filled 2021-05-15: qty 3

## 2021-05-15 MED ORDER — ATORVASTATIN CALCIUM 40 MG PO TABS
40.0000 mg | ORAL_TABLET | Freq: Every day | ORAL | Status: DC
  Administered 2021-05-16 – 2021-05-22 (×7): 40 mg via ORAL
  Filled 2021-05-15 (×7): qty 1

## 2021-05-15 MED ORDER — GUAIFENESIN ER 600 MG PO TB12: 600.0000 mg | ORAL_TABLET | Freq: Two times a day (BID) | ORAL | Status: DC | PRN

## 2021-05-15 MED ORDER — SODIUM CHLORIDE 0.9 % IV SOLN: INTRAVENOUS | Status: DC

## 2021-05-15 MED ORDER — ENOXAPARIN SODIUM 40 MG/0.4ML IJ SOSY
40.0000 mg | PREFILLED_SYRINGE | INTRAMUSCULAR | Status: DC
  Administered 2021-05-15: 40 mg via SUBCUTANEOUS
  Filled 2021-05-15: qty 0.4

## 2021-05-15 MED ORDER — HYDRALAZINE HCL 20 MG/ML IJ SOLN: 5.0000 mg | INTRAMUSCULAR | Status: DC | PRN

## 2021-05-15 MED ORDER — MORPHINE SULFATE (PF) 2 MG/ML IV SOLN: 2.0000 mg | INTRAVENOUS | Status: DC | PRN

## 2021-05-15 NOTE — Assessment & Plan Note (Addendum)
Positive on 2/22, completed isolation. ? ?

## 2021-05-15 NOTE — Assessment & Plan Note (Addendum)
Patient presenting to the ED with altered mental status.  Patient was noted to have elevated temperature of 100.3, WBC count 41.5, and chest x-ray findings of multifocal pneumonia.  Patient was also significantly hypoxic requiring 7 L high flow nasal cannula on arrival.  Also with tachycardia, tachypnea and evidence of acute organ failure with elevated lactic acid of 3.1.  Urinalysis unrevealing. ?--WBC 41.5>37.1 ?--Blood cultures x2: Pending ?--Urine culture: Pending ?--MRSA PCR negative, will discontinue vancomycin ?--Continue Zosyn ?--Continuous home oxygen, maintain SPO2 greater than 92%, currently on 4 L nasal cannula ?--CBC daily ?--Palliative care consulted for recurrence and prolonged hospitalization ?--Aspiration precautions ?

## 2021-05-15 NOTE — ED Provider Notes (Signed)
?[facility] ?Provider Note ? ? ?CSN: [id number] ?Arrival date & time: 05/15/21  1052 ? ?  ? ?History ? ?Chief Complaint  ?Patient presents with  ? Altered Mental Status  ? ? ?Brittany [name] is a 61 y.o. female. ? ?HPI ?5 caveat secondary to altered mental status, ability to give history, patient from nursing home and report given.  I did try to contact them several times. ?This is a 61 year old female who on chart review was hospitalized from February through March.  During that time she was diagnosed with new onset of seizures, acute Bolick encephalopathy, sepsis, DKA.  Today she presents from the nursing home with reports that she is less responsive than usual.  They state that at 10 PM last night she was at her baseline, but it is unclear exactly what this is.  Today, she was less responsive and hypoxic.  Her sats were low on initial evaluation reported in the 60s and patient placed on increased oxygen. ? ?  ? ?Home Medications ?Prior to Admission medications   ?Medication Sig Start Date End Date Taking? Authorizing Provider  ?atorvastatin (LIPITOR) 40 MG tablet Take 1 tablet (40 mg total) by mouth Nightly. ?Patient taking differently: Take 40 mg by mouth at bedtime. 03/14/21 07/22/21 Yes Fritzi Mandes, MD  ?camphor-menthol [facility]) lotion Apply topically as needed for itching. ?Patient taking differently: Apply 1 application. topically as needed for itching. 05/09/21  Yes Hosie Poisson, MD  ?clonazePAM (KLONOPIN) 0.25 MG disintegrating tablet Take 1 tablet (0.25 mg total) by mouth 2 (two) times daily. 05/09/21  Yes Hosie Poisson, MD  ?diclofenac Sodium (VOLTAREN) 1 % GEL Apply 2 g topically 4 (four) times daily. 01/09/21  Yes [provider]  ?divalproex (DEPAKOTE) 250 MG DR tablet Take 1,250 mg by mouth at bedtime.   Yes [provider]  ?gabapentin (NEURONTIN) 100 MG capsule Take 2 capsules (200 mg total) by mouth 3 (three) times daily. 05/09/21  Yes Hosie Poisson,  MD  ?Glycerin-Hypromellose-PEG 400 0.2-0.2-1 % SOLN Place 1 drop into both eyes in the morning, at noon, and at bedtime.   Yes [provider]  ?insulin glargine-yfgn (SEMGLEE) 100 UNIT/ML injection Inject 0.2 mLs (20 Units total) into the skin 2 (two) times daily. 05/09/21  Yes Hosie Poisson, MD  ?insulin lispro (HUMALOG) 100 UNIT/ML injection Inject 2-5 Units into the skin 3 (three) times daily before meals. Per sliding scale   Yes [provider]  ?liver oil-zinc oxide (DESITIN) 40 % ointment Apply topically as needed for irritation. ?Patient taking differently: Apply 1 application. topically as needed for irritation. 03/14/21  Yes Fritzi Mandes, MD  ?melatonin 3 MG TABS tablet Take 2 tablets (6 mg total) by mouth at bedtime. 05/09/21  Yes Hosie Poisson, MD  ?Multiple Vitamin (MULTIVITAMIN WITH MINERALS) TABS tablet Take 1 tablet by mouth daily. 05/10/21  Yes Hosie Poisson, MD  ?OLANZapine (ZYPREXA) 7.5 MG tablet Take 1 tablet (7.5 mg total) by mouth at bedtime. 05/09/21  Yes Hosie Poisson, MD  ?Pancrelipase, Lip-Prot-Amyl, 24000-76000 units CPEP Take 3 capsules (72,000 Units total) by mouth 3 (three) times daily. 12/27/20  Yes Wieting, Richard, MD  ?polyethylene glycol (MIRALAX / GLYCOLAX) 17 g packet Place 17 g into feeding tube daily as needed for mild constipation. ?Patient taking differently: Take 17 g by mouth daily as needed for mild constipation. 05/09/21  Yes Hosie Poisson, MD  ?rOPINIRole (REQUIP) 1 MG tablet Take 1 tablet (1 mg total) by mouth 2 (two) times  daily. ?Patient taking differently: Take 1 mg by mouth in the morning and at bedtime. 08/04/19  Yes Love, Ivan Anchors, PA-C  ?thiamine 100 MG tablet Take 1 tablet (100 mg total) by mouth daily. 05/10/21  Yes Hosie Poisson, MD  ?divalproex (DEPAKOTE ER) 250 MG 24 hr tablet Take 5 tablets (1,250 mg total) by mouth at bedtime. ?Patient not taking: Reported on 05/15/2021 05/09/21   Hosie Poisson, MD  ?insulin aspart (NOVOLOG) 100 UNIT/ML injection CBG  70 - 120: 0 units ? CBG 121 - 150: 0 units ? CBG 151 - 200: 0 units ? CBG 201 - 250: 2 units  ?CBG 251 - 300: 3 units  ?CBG 301 - 350: 4 units  ?CBG 351 - 400: 5 units ?Patient not taking: Reported on 05/15/2021 05/09/21   Hosie Poisson, MD  ?   ? ?Allergies    ?Cephalosporins, Hydromorphone, Keflin [cephalothin], and Lactose intolerance (gi)   ? ?Review of Systems   ?Review of Systems  ?Unable to perform ROS: Mental status change  ? ?Physical Exam ?Updated Vital Signs ?BP (!) 143/90   Pulse (!) 101   Temp 100.3 ?F (37.9 ?C) (Rectal)   Resp 17   Ht 1.524 m (5')   Wt 44.1 kg   SpO2 95%   BMI 18.99 kg/m?  ?Physical Exam ?Vitals and nursing note reviewed.  ?Constitutional:   ?   General: She is not in acute distress. ?   Appearance: She is ill-appearing.  ?   Comments: Patient is somnolent but arousable to verbal stimulation  ?HENT:  ?   Head: Normocephalic.  ?   Right Ear: External ear normal.  ?   Left Ear: External ear normal.  ?   Nose: Nose normal.  ?   Mouth/Throat:  ?   Comments: Mucous membranes appear moist and there are thick secretions noted in her mouth ?Eyes:  ?   Pupils: Pupils are equal, round, and reactive to light.  ?Cardiovascular:  ?   Rate and Rhythm: Normal rate and regular rhythm.  ?Pulmonary:  ?   Effort: Pulmonary effort is normal.  ?   Breath sounds: Normal breath sounds.  ?   Comments: Patient is coughing on exam she has bilateral rales noted ?Abdominal:  ?   Palpations: Abdomen is soft.  ?   Comments: Colostomy tube in place no signs of infection surrounding  ?Musculoskeletal:  ?   Cervical back: Normal range of motion.  ?   Comments: Skin examined and some breakdown noted in the sacral area ?Extremities examined and ranged with no obvious deformities or pain noted ?  ?Skin: ?   General: Skin is warm and dry.  ?   Findings: No rash.  ?Neurological:  ?   General: No focal deficit present.  ?   Comments: Patient opens eyes and attempts to follow some commands there are no lateralized  deficits appreciated  ? ? ?ED Results / Procedures / Treatments   ?Labs ?(all labs ordered are listed, but only abnormal results are displayed) ?Labs Reviewed  ?LACTIC ACID, PLASMA - Abnormal; Notable for the following components:  ?    Result Value  ? Lactic Acid, Venous 3.1 (*)   ? All other components within normal limits  ?LACTIC ACID, PLASMA - Abnormal; Notable for the following components:  ? Lactic Acid, Venous 3.5 (*)   ? All other components within normal limits  ?COMPREHENSIVE METABOLIC PANEL - Abnormal; Notable for the following components:  ? Chloride 115 (*)   ?  CO2 21 (*)   ? Calcium 8.5 (*)   ? Albumin 2.0 (*)   ? All other components within normal limits  ?CBC WITH DIFFERENTIAL/PLATELET - Abnormal; Notable for the following components:  ? WBC 41.5 (*)   ? RBC 3.05 (*)   ? Hemoglobin 8.9 (*)   ? HCT 29.0 (*)   ? RDW 17.8 (*)   ? Platelets 636 (*)   ? Neutro Abs 34.4 (*)   ? Lymphs Abs 4.5 (*)   ? Monocytes Absolute 1.8 (*)   ? Abs Immature Granulocytes 0.69 (*)   ? All other components within normal limits  ?PROTIME-INR - Abnormal; Notable for the following components:  ? Prothrombin Time 16.5 (*)   ? INR 1.3 (*)   ? All other components within normal limits  ?URINALYSIS, ROUTINE W REFLEX MICROSCOPIC - Abnormal; Notable for the following components:  ? APPearance HAZY (*)   ? All other components within normal limits  ?CBG MONITORING, ED - Abnormal; Notable for the following components:  ? Glucose-Capillary 67 (*)   ? All other components within normal limits  ?I-STAT ARTERIAL BLOOD GAS, ED - Abnormal; Notable for the following components:  ? pCO2 arterial 30.5 (*)   ? pO2, Arterial 48 (*)   ? Sodium 147 (*)   ? HCT 27.0 (*)   ? Hemoglobin 9.2 (*)   ? All other components within normal limits  ?CULTURE, BLOOD (ROUTINE X 2)  ?CULTURE, BLOOD (ROUTINE X 2)  ?URINE CULTURE  ?MRSA NEXT GEN BY PCR, NASAL  ?APTT  ?VALPROIC ACID LEVEL  ?CBG MONITORING, ED  ? ? ?EKG ?EKG Interpretation ? ?Date/Time:  Monday  May 15 2021 11:12:24 EDT ?Ventricular Rate:  106 ?PR Interval:  121 ?QRS Duration: 75 ?QT Interval:  335 ?QTC Calculation: 445 ?R Axis:   87 ?Text Interpretation: Sinus tachycardia Borderline right axis deviatio

## 2021-05-15 NOTE — Progress Notes (Signed)
Pharmacy Antibiotic Note ? ?Brittany [name] is a 61 y.o. female admitted on 05/15/2021 with pneumonia and sepsis.  Pharmacy has been consulted for Vancomycin/zosyn dosing. ? ?Scr 0.84 (around Baseline) ?History of abscess with ecoli and kleb pneumo from 1/23 ? ?Plan: ?Vancomycin '1000mg'$  LD x1 followed by vancomycin '750mg'$  q24hr (eAUC 509) ?MRSA nares PCR ordered per protocol ?Zosyn 3.375gm q8hr ?Plan to obtain levels at steady state if therapy continued  ?Will monitor for acute changes in renal function and adjust as needed ?F/u cultures results and de-escalate as appropriate ? ? ?Height: 5' (152.4 cm) ?Weight: 44.1 kg (97 lb 3.6 oz) ?IBW/kg (Calculated) : 45.5 ? ?Temp (24hrs), Avg:100.3 ?F (37.9 ?C), Min:100.3 ?F (37.9 ?C), Max:100.3 ?F (37.9 ?C) ? ?Recent Labs  ?Lab 05/09/21 ?0908  ?WBC 14.7*  ?  ?Estimated Creatinine Clearance: 49 mL/min (by C-G formula based on SCr of 0.84 mg/dL).   ? ?Allergies  ?Allergen Reactions  ? Cephalosporins Itching  ?  TOLERATED ZOSYN (PIPERACILLIN) BEFORE  ? Hydromorphone Itching  ? Keflin [Cephalothin] Itching  ? Lactose Intolerance (Gi) Diarrhea  ? ? ?Antimicrobials this admission: ?Vancomycin 4/3  >>  ?Zosyn 4/3 >>  ? ?Microbiology results: ?4/3 BCx: IP ?4/3 UCx: IP  ?4/3 Sputum: IP  ?4/3 MRSA PCR: ip ? ?Thank you for allowing pharmacy to be a part of this patient?s care. ? ?Victoriano Campion E Seng Larch ?05/15/2021 1:20 PM ? ?

## 2021-05-15 NOTE — Assessment & Plan Note (Addendum)
Previously diagnosed during last hospitalization. She was initially started on Keppra but this was changed to Depakote per neurology and psychiatry due to behavioral disturbances.  Valproic acid level low at 28.  Home regimen includes Depakote 1250 mg nightly, Klonopin 0.25 mg p.o. twice daily. ?--Depakote 400 mg IV every 8 hours ?--Ativan 0.5 mg IV every 12 hours ?--Olanzapine 7.5 mg qHS ?

## 2021-05-15 NOTE — Assessment & Plan Note (Addendum)
--  Atorvastatin 40 mg p.o. daily ?

## 2021-05-15 NOTE — Assessment & Plan Note (Addendum)
Hx diverticulitis with abscess s/p colostomy ?--Wound care consulted for assistance with this as well as early pressure ulcers present on admission ?

## 2021-05-15 NOTE — Assessment & Plan Note (Addendum)
Patient with history of CVA, has received tube feeds in the past during prior hospitalization related to malnutrition, n.p.o. status from altered mental status/DKA.  Evaluated by speech therapy on this admission and recommended dysphagia 2 diet with thin liquids.  High concern for aspiration given multifocal pneumonia on imaging. ?-- Dysphagia 2 diet with thin liquids ?-- Aspiration precautions ? ?

## 2021-05-15 NOTE — Sepsis Progress Note (Signed)
Sepsis protocol is being followed by [facility]. 

## 2021-05-15 NOTE — Assessment & Plan Note (Addendum)
Patient with history of prior CVA with resultant right-sided hemiparesis. ?--Atorvastatin 40 mg p.o. daily ?--Not on aspirin or Plavix as outpatient ? ?

## 2021-05-15 NOTE — H&P (Signed)
?History and Physical  ? ? ?[name] PYP:[id number] DOB: [birth year] ?DOA: 05/15/2021 ?DOS: the patient was seen and examined on 05/15/2021 ?PCP: [facility]  ?Patient coming from: [facility]; NOK: Daughter, Jin Shockley, [phone] ? ? ?Chief Complaint: AMS ? ?HPI: Brittany [name] is a 61 y.o. female with medical history significant of HTN; DM; CVA with R hemiparesis; COPD; s/p pancreatectomy/splenectomy; diverticulitis with abscess leading to colostomy (03/10/21); anxiety/depression; discoid lupus; and HLD who was previously hospitalized from 2/22-3/28 for new-onset seizures with sepsis due to aspiration PNA and DKA.  She was also COVID +.  She was discharged on 3/28 to [facility] and returned today with AMS.  The patient is able to awaken and answer limited questions.  She is oriented at least to person and was able to repeatedly say, "Dr. Lorin Mercy, I don't feel good."  She wasn't able to answer more specific questions but could follow some commands, as well. ? ?I spoke with her daughter.  She was supposed to be discharged from rehab today, "didn't really need the rehab" and was going to go home.  The nurse called her this AM and said her O2 was low and she was drowsy.  She is able to eat/drink finger foods, has difficulty holding utensils.  Sometimes she "gets tired of chewing" and holds food in her mouth.  She is not normally on oxygen.  She had a feeding tube at first but that was removed.  They were getting stuff set up for her to come home tomorrow.  ? ? ? ?ER Course:  Pressure ulcers, not very mobile at baseline.  Currently with depressed mental status, different from "usual", LKW 10pm.  Gurgling, hypoxia, multifocal PNA on CXR, on 7L [place] O2. ? ? ? ? ?Review of Systems: unable to review all systems due to the inability of the patient to answer questions. ?Past Medical History:  ?Diagnosis Date  ? Colostomy in place [facility])   ? h/o diverticulitis with abscess  ? Diabetes mellitus  without complication ([place])   ? Dyslipidemia   ? Hypertension   ? Lupus ([place])   ? Pancreatic cancer ([place])   ? RLS (restless legs syndrome)   ? Seizure disorder ([place])   ? 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 ?Past Surgical History:  ?Procedure Laterality Date  ? COLECTOMY WITH COLOSTOMY CREATION/HARTMANN PROCEDURE N/A 03/10/2021  ? Procedure: COLECTOMY WITH COLOSTOMY CREATION/HARTMANN PROCEDURE;  Surgeon: Jules Husbands, MD;  Location: [facility] ORS;  Service: General;  Laterality: N/A;  ? PANCREATECTOMY    ? spleenectomy    ? ?Social History:  reports that she has quit smoking. Her smoking use included cigarettes. She has been exposed to tobacco smoke. She has never used smokeless tobacco. She reports current alcohol use. She reports that she does not currently use drugs. ? ?Allergies  ?Allergen Reactions  ? Cephalosporins Itching  ?  TOLERATED ZOSYN (PIPERACILLIN) BEFORE  ? Hydromorphone Itching  ? Keflin [Cephalothin] Itching  ? Lactose Intolerance (Gi) Diarrhea  ? ? ?Family History  ?Problem Relation Age of Onset  ? Anxiety disorder Sister   ? Breast cancer Maternal Aunt   ? ? ?Prior to Admission medications   ?Medication Sig Start Date End Date Taking? Authorizing Provider  ?atorvastatin (LIPITOR) 40 MG tablet Take 1 tablet (40 mg total) by mouth Nightly. ?Patient taking differently: Take 40 mg by mouth at bedtime. 03/14/21 07/22/21 Yes Fritzi Mandes, MD  ?camphor-menthol [facility])  lotion Apply topically as needed for itching. ?Patient taking differently: Apply 1 application. topically as needed for itching. 05/09/21  Yes Hosie Poisson, MD  ?clonazePAM (KLONOPIN) 0.25 MG disintegrating tablet Take 1 tablet (0.25 mg total) by mouth 2 (two) times daily. 05/09/21  Yes Hosie Poisson, MD  ?diclofenac Sodium (VOLTAREN) 1 % GEL Apply 2 g topically 4 (four) times daily. 01/09/21  Yes [provider]  ?divalproex (DEPAKOTE) 250 MG DR tablet Take 1,250 mg by mouth at bedtime.   Yes [provider]  ?gabapentin (NEURONTIN) 100 MG capsule Take 2 capsules (200 mg total) by mouth 3 (three) times daily. 05/09/21  Yes Hosie Poisson, MD  ?Glycerin-Hypromellose-PEG 400 0.2-0.2-1 % SOLN Place 1 drop into both eyes in the morning, at noon, and at bedtime.   Yes [provider]  ?insulin glargine-yfgn (SEMGLEE) 100 UNIT/ML injection Inject 0.2 mLs (20 Units total) into the skin 2 (two) times daily. 05/09/21  Yes Hosie Poisson, MD  ?insulin lispro (HUMALOG) 100 UNIT/ML injection Inject 2-5 Units into the skin 3 (three) times daily before meals. Per sliding scale   Yes [provider]  ?liver oil-zinc oxide (DESITIN) 40 % ointment Apply topically as needed for irritation. ?Patient taking differently: Apply 1 application. topically as needed for irritation. 03/14/21  Yes Fritzi Mandes, MD  ?melatonin 3 MG TABS tablet Take 2 tablets (6 mg total) by mouth at bedtime. 05/09/21  Yes Hosie Poisson, MD  ?Multiple Vitamin (MULTIVITAMIN WITH MINERALS) TABS tablet Take 1 tablet by mouth daily. 05/10/21  Yes Hosie Poisson, MD  ?OLANZapine (ZYPREXA) 7.5 MG tablet Take 1 tablet (7.5 mg total) by mouth at bedtime. 05/09/21  Yes Hosie Poisson, MD  ?Pancrelipase, Lip-Prot-Amyl, 24000-76000 units CPEP Take 3 capsules (72,000 Units total) by mouth 3 (three) times daily. 12/27/20  Yes Wieting, Richard, MD  ?polyethylene glycol (MIRALAX / GLYCOLAX) 17 g packet Place 17 g into feeding tube daily as needed for mild constipation. ?Patient taking differently: Take 17 g by mouth daily as needed for mild constipation. 05/09/21  Yes Hosie Poisson, MD  ?rOPINIRole (REQUIP) 1 MG tablet Take 1 tablet (1 mg total) by mouth 2 (two) times daily. ?Patient taking differently: Take 1 mg by mouth in the morning and at bedtime. 08/04/19  Yes Love, Ivan Anchors, PA-C  ?thiamine 100 MG tablet Take 1 tablet (100 mg total) by mouth daily. 05/10/21  Yes Hosie Poisson, MD  ?divalproex (DEPAKOTE ER) 250 MG 24 hr tablet Take 5 tablets (1,250 mg  total) by mouth at bedtime. ?Patient not taking: Reported on 05/15/2021 05/09/21   Hosie Poisson, MD  ?insulin aspart (NOVOLOG) 100 UNIT/ML injection CBG 70 - 120: 0 units ? CBG 121 - 150: 0 units ? CBG 151 - 200: 0 units ? CBG 201 - 250: 2 units  ?CBG 251 - 300: 3 units  ?CBG 301 - 350: 4 units  ?CBG 351 - 400: 5 units ?Patient not taking: Reported on 05/15/2021 05/09/21   Hosie Poisson, MD  ? ? ?Physical Exam: ?Vitals:  ? 05/15/21 1345 05/15/21 1500 05/15/21 1545 05/15/21 1615  ?BP: 126/85 (!) 149/97 (!) 137/93 (!) 143/90  ?Pulse: (!) 109 98 100 (!) 101  ?Resp: '16 17 16 17  '$ ?Temp:      ?TempSrc:      ?SpO2: 92% 95% 94% 95%  ?Weight:      ?Height:      ? ?General:  Appears acute on chronically ill, frail, cachectic ?Eyes:  PERRL, EOMI, normal lids, iris ?  ENT:  grossly normal hearing, lips & tongue, mildly dry mm ?Neck:  no LAD, masses or thyromegaly ?Cardiovascular:  RRR, no m/r/g. Tr LE edema.  ?Respiratory:   Diffuse rhonchi.  Normal respiratory effort. ?Abdomen:  soft, NT, ND, colostomy in place in LLQ ?Skin:  no rash or induration seen on limited exam ?Musculoskeletal:  decreased tone BUE/BLE but R >L, no bony abnormality ?Psychiatric:  blunted mood and affect, speech sparse but appropriate, AOx1+ ?Neurologic:  unable to effectively perform ? ? ?Radiological Exams on Admission: ?Independently reviewed - see discussion in A/P where applicable ? ?CT Head Wo Contrast ? ?Result Date: 05/15/2021 ?CLINICAL DATA:  Mental status change. EXAM: CT HEAD WITHOUT CONTRAST TECHNIQUE: Contiguous axial images were obtained from the base of the skull through the vertex without intravenous contrast. RADIATION DOSE REDUCTION: This exam was performed according to the departmental dose-optimization program which includes automated exposure control, adjustment of the mA and/or kV according to patient size and/or use of iterative reconstruction technique. COMPARISON:  CT head dated April 05, 2021 FINDINGS: Brain: No evidence of acute  infarction, hemorrhage, hydrocephalus, extra-axial collection or mass lesion/mass effect. Encephalomalacia of the left cerebellum, left parietal/occipital lobes and right frontal lobe, unchanged. Diffuse low-atte

## 2021-05-15 NOTE — Assessment & Plan Note (Addendum)
Body mass index is 18.99 kg/m?Marland Kitchen  ?Nutrition Problem: Severe Malnutrition ?Etiology: chronic illness (cancer, lupus) ?Signs/Symptoms: severe muscle depletion, severe fat depletion, percent weight loss ?Percent weight loss: 16.5 % (x5 months) ?--Dietitian consult ?--Seen by SLP, dysphagia 2 diet with thin liquids; aspiration precautions ?

## 2021-05-15 NOTE — Consult Note (Signed)
? ?NAME:  Brittany [name], MRN:  [id number], DOB:  [birth year], LOS: 0 ?ADMISSION DATE:  05/15/2021, CONSULTATION DATE:  3/4 ?REFERRING MD:  Lorin Mercy, CHIEF COMPLAINT:  respiratory failure and PNA   ? ?History of Present Illness:  ?This is a 61 year old female who was recently discharged from [facility] on 3/28 after being admitted Towards the end of February with new onset seizure in the setting of DKA, sepsis in the setting of aspiration pneumonia, severe metabolic derangements and acute metabolic encephalopathy.  She was discharged to a skilled nursing facility, as it appears as though prior to admission she was on able to care for herself or her home.  She presented to the emergency room on 4/3 from skilled nursing facility with report of change in mental status.  She apparently was at her baseline cognitive function per staff on the evening hours of 4/6 2 however the morning of 4/3 she was found less interactive, pulse oximetry 60%, and she had increased labored breathing and therefore EMS was called.  In the ER a chest x-ray was obtained that showed fairly significant right-sided airspace disease she required titration of oxygen up to 6 L via nasal cannula to maintain saturations greater than 90%, preliminary significant lab work: Initial lactate 3.1, this rose to 3.5 after initial fluid bolus.  White blood cell count 41.5.  In addition to her 30 mL/kg fluid bolus cultures were sent and antibiotics were started.  Given the patient's frail baseline state, elevated lactate, and oxygen requirements pulmonary/critical care was asked to evaluate. ? ?Pertinent  Medical History  ? ?HTN, diabetes (Insulin dep), recent admit for DKA, new onset seizures, hypertension, history of stroke, status post colostomy, COPD, protein calorie malnutrition, severe, cognitive impairment.  Discoid lupus erythremia, hyperlipidemia. ?Significant Hospital Events: ?Including procedures, antibiotic start and stop dates in addition to other pertinent  events   ?Admitted w/ sepsis 2/2 PNA (presume HCAP vs aspiration) w/ associated respiratory failure. Vanc,  and zosyn started.  ? ?Interim History / Subjective:  ?No distress  ?Does get emotional during interview  ? ?Objective   ?Blood pressure (Abnormal) 143/90, pulse (Abnormal) 101, temperature 100.3 ?F (37.9 ?C), temperature source Rectal, resp. rate 17, height 5' (1.524 m), weight 44.1 kg, SpO2 95 %. ?   ?   ?No intake or output data in the 24 hours ending 05/15/21 1746 ?Filed Weights  ? 05/15/21 1124  ?Weight: 44.1 kg  ? ? ?Examination: ?General: 61 year old female. Debilitated and chronically ill appearing  ?HENT: Neck veins are flat, mucous membranes dry. ?Lungs: Coarse scattered rhonchi diminished right base currently no accessory use on 6 L/min ?Cardiovascular: Tachycardic without murmur rub or gallop ?Abdomen: soft not tender ostomy unremarkable  ?Extremities: warm, trace LE edema pulses are strong ?Neuro: awake oriented X 2. Sp slurred moves all ext w/ equal but weak strength ? ? ?Resolved Hospital Problem list   ? ? ?Assessment & Plan:  ?Principal Problem: ?  Multifocal pneumonia ?Active Problems: ?  COPD, mild ([place]) ?  Essential hypertension ?  Intraductal papillary mucinous neoplasm of pancreas ?  Restless leg syndrome ?  Acute metabolic encephalopathy ?  Protein-calorie malnutrition, severe ?  History of stroke ?  HLD (hyperlipidemia) ?  Status post colostomy [facility]) ?  Seizure disorder ([place]) ?  Pressure injury of skin ?  Sepsis ([place]) ?  Diabetes mellitus type 2 in nonobese [facility]) ?  History of COVID-19 ?  Dysphagia ?  Goals of care, counseling/discussion ?  Acute respiratory failure (  [place]) ? ?Acute hypoxic respiratory failure in the setting of pneumonia.  Hcap versus aspiration.  Has history of dysphagia ?Portable chest x-ray personally reviewed demonstrating fairly significant right lower lobe airspace disease currently oxygen requirement 6 L ?Plan ?Admit to progressive care unit ?Continue pulse  oximetry ?Wean supplemental oxygen ?Culture sent, will follow ?Started on vancomycin and Zosyn, note allergy profile, has tolerated Zosyn in the past ?A.m. chest x-ray ? ?Sepsis secondary to pneumonia, c/b lactic acidosis: rising lactate in spite of initial fluid bolus however on physical exam she remains tachycardic with fairly dry mucous membranes still appears dry ?Plan ?Repeat fluid bolus ?Continue IV hydration ?Antibiotics as above ?Hold any antihypertensives or diuretics ?Telemetry monitoring ?Repeat lactate ? ?Acute metabolic encephalopathy superimposed on underlying cognitive dysfunction and history of multiple CVAs ?Not quite clear what her baseline cognitive function is.  Currently protecting her airway ?Plan ?Hold sedating drugs ?Treat sepsis and pneumonia ?Supportive care ? ?Anemia  ?Plan ?Trend cbc ? ?Insulin-dependent diabetes type 2 ?plan ?Sliding scale insulin ? ?All other problems as listed above and as directed by IM service  ?Best Practice (right click and "Reselect all SmartList Selections" daily)  ? ?Diet/type: NPO ?DVT prophylaxis: prophylactic heparin  ?GI prophylaxis: N/A ?Lines: N/A ?Foley:  N/A ?Code Status:  full code ?Last date of multidisciplinary goals of care discussion [pending ] ? We will see again in am  ?Labs   ?CBC: ?Recent Labs  ?Lab 05/09/21 ?0908 05/15/21 ?1323 05/15/21 ?1355  ?WBC 14.7* 41.5*  --   ?NEUTROABS 8.4* 34.4*  --   ?HGB 10.1* 8.9* 9.2*  ?HCT 32.8* 29.0* 27.0*  ?MCV 92.9 95.1  --   ?PLT 944* 636*  --   ? ? ?Basic Metabolic Panel: ?Recent Labs  ?Lab 05/15/21 ?1323 05/15/21 ?1355  ?NA 145 147*  ?K 4.4 4.0  ?CL 115*  --   ?CO2 21*  --   ?GLUCOSE 76  --   ?BUN 14  --   ?CREATININE 0.81  --   ?CALCIUM 8.5*  --   ? ?GFR: ?Estimated Creatinine Clearance: 50.8 mL/min (by C-G formula based on SCr of 0.81 mg/dL). ?Recent Labs  ?Lab 05/09/21 ?0908 05/15/21 ?1323 05/15/21 ?1550  ?WBC 14.7* 41.5*  --   ?LATICACIDVEN  --  3.1* 3.5*  ? ? ?Liver Function Tests: ?Recent Labs  ?Lab  05/15/21 ?1323  ?AST 27  ?ALT 14  ?ALKPHOS 73  ?BILITOT 0.5  ?PROT 7.4  ?ALBUMIN 2.0*  ? ?No results for input(s): LIPASE, AMYLASE in the last 168 hours. ?No results for input(s): AMMONIA in the last 168 hours. ? ?ABG ?   ?Component Value Date/Time  ? PHART 7.448 05/15/2021 1355  ? PCO2ART 30.5 (L) 05/15/2021 1355  ? PO2ART 48 (L) 05/15/2021 1355  ? HCO3 20.9 05/15/2021 1355  ? TCO2 22 05/15/2021 1355  ? ACIDBASEDEF 2.0 05/15/2021 1355  ? O2SAT 84 05/15/2021 1355  ?  ? ?Coagulation Profile: ?Recent Labs  ?Lab 05/15/21 ?1323  ?INR 1.3*  ? ? ?Cardiac Enzymes: ?No results for input(s): CKTOTAL, CKMB, CKMBINDEX, TROPONINI in the last 168 hours. ? ?HbA1C: ?Hgb A1c MFr Bld  ?Date/Time Value Ref Range Status  ?04/06/2021 01:12 AM 15.0 (H) 4.8 - 5.6 % Final  ?  Comment:  ?  (NOTE) ?        Prediabetes: 5.7 - 6.4 ?        Diabetes: >6.4 ?        Glycemic control for adults with diabetes: <7.0 ?  ?11/06/2020  04:53 AM 15.3 (H) 4.8 - 5.6 % Final  ?  Comment:  ?  (NOTE) ?        Prediabetes: 5.7 - 6.4 ?        Diabetes: >6.4 ?        Glycemic control for adults with diabetes: <7.0 ?  ? ? ?CBG: ?Recent Labs  ?Lab 05/08/21 ?2205 05/09/21 ?5701 05/09/21 ?1158 05/09/21 ?1609 05/15/21 ?1128  ?GLUCAP 165* 128* 120* 277* 67*  ? ? ?Review of Systems:   ?Unable  ? 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  ? ?Surgical History:  ? ?Past Surgical History:  ?Procedure Laterality Date  ? COLECTOMY WITH COLOSTOMY CREATION/HARTMANN PROCEDURE N/A 03/10/2021  ? Procedure: COLECTOMY WITH COLOSTOMY CREATION/HARTMANN PROCEDURE;  Surgeon: Jules Husbands, MD;  Location: [facility] ORS;  Service: General;  Laterality: N/A;  ? PANCREATECTOMY    ? spleenectomy    ?  ? ?Social  History:  ? reports that she has quit smoking. Her smoking use included cigarettes. She has been exposed to tobacco smoke. She has never used smokeless tobacco. She reports current alcohol use. She reports that she do

## 2021-05-15 NOTE — Assessment & Plan Note (Addendum)
s/p pancreatectomy and splenectomy ?--Resume pancrelipase now cleared by speech therapy for diet ?

## 2021-05-15 NOTE — Assessment & Plan Note (Addendum)
Code status reviewed with family, full code at this time.  Patient with recurrent and prolonged hospitalizations ?--palliative care consult pending ?

## 2021-05-15 NOTE — ED Triage Notes (Signed)
Patient presents to ed via [facility] states patient is AMS last seen her normal 10 pm . More lethargic today  ?

## 2021-05-15 NOTE — Progress Notes (Signed)
Pt struggles to cough out secretions in upper airway. Huff cough was performed and pt was able to cough out moderate tan thick secretions. Flutter valve was ordered and put pt on humidity [place]. ED RT will continue to monitor for any changes. ?

## 2021-05-15 NOTE — Assessment & Plan Note (Addendum)
Patient presenting from SNF with altered mental status.  Recent hospitalization for new onset seizures and DKA.  On arrival, she was mildly impaired but able to answer some limited questions, unlikely secondary to recurrent seizures.  Patient with elevated WBC count of 41.5 with chest x-ray findings of multifocal pneumonia consistent with likely aspiration.  Etiology of her encephalopathy likely secondary to sepsis from aspiration pneumonia. ?--Delirium precautions ?--Antibiotics as below ?

## 2021-05-15 NOTE — Assessment & Plan Note (Addendum)
Continue Requip. 

## 2021-05-15 NOTE — Assessment & Plan Note (Addendum)
Not currently on home Antivert tenses. ?--Hydralazine IV PRN ?

## 2021-05-15 NOTE — Assessment & Plan Note (Addendum)
--  Mucinex 600 mg BID PRN cough ?-- Albuterol neb as needed shortness of breath/wheezing ?

## 2021-05-15 NOTE — Assessment & Plan Note (Addendum)
Recent hospitalized with DKA.  Home regimen includes Semglee 20 units twice daily, insulin sliding scale.  Recent hemoglobin A1c 15, indicating poor control. ?-- Holding long-acting insulin given AMS with poor intake ?-- Now cleared for dysphagia 2 diet by SLP ?--SSI for coverage ?--CBGs qAC/HS ?

## 2021-05-16 ENCOUNTER — Inpatient Hospital Stay (HOSPITAL_COMMUNITY): Payer: Medicare Other

## 2021-05-16 DIAGNOSIS — A419 Sepsis, unspecified organism: Secondary | ICD-10-CM

## 2021-05-16 LAB — BASIC METABOLIC PANEL
Anion gap: 11 (ref 5–15)
BUN: 14 mg/dL (ref 8–23)
CO2: 23 mmol/L (ref 22–32)
Calcium: 8.8 mg/dL — ABNORMAL LOW (ref 8.9–10.3)
Chloride: 110 mmol/L (ref 98–111)
Creatinine, Ser: 0.71 mg/dL (ref 0.44–1.00)
GFR, Estimated: >60 mL/min (ref >60)
Glucose, Bld: 112 mg/dL — ABNORMAL HIGH (ref 70–99)
Potassium: 4.4 mmol/L (ref 3.5–5.1)
Sodium: 144 mmol/L (ref 135–145)

## 2021-05-16 LAB — CBC
HCT: 27.7 % — ABNORMAL LOW (ref 36.0–46.0)
Hemoglobin: 8.4 g/dL — ABNORMAL LOW (ref 12.0–15.0)
MCH: 28.9 pg (ref 26.0–34.0)
MCHC: 30.3 g/dL (ref 30.0–36.0)
MCV: 95.2 fL (ref 80.0–100.0)
Platelets: 553 10*3/uL — ABNORMAL HIGH (ref 150–400)
RBC: 2.91 MIL/uL — ABNORMAL LOW (ref 3.87–5.11)
RDW: 17.5 % — ABNORMAL HIGH (ref 11.5–15.5)
WBC: 37.1 10*3/uL — ABNORMAL HIGH (ref 4.0–10.5)
nRBC: 0.1 % (ref 0.0–0.2)

## 2021-05-16 LAB — GLUCOSE, CAPILLARY
Glucose-Capillary: 178 mg/dL — ABNORMAL HIGH (ref 70–99)
Glucose-Capillary: 141 mg/dL — ABNORMAL HIGH (ref 70–99)
Glucose-Capillary: 98 mg/dL (ref 70–99)

## 2021-05-16 LAB — URINE CULTURE: Culture: NO GROWTH

## 2021-05-16 LAB — LEGIONELLA PNEUMOPHILA SEROGP 1 UR AG: L. pneumophila Serogp 1 Ur Ag: NEGATIVE

## 2021-05-16 LAB — CBG MONITORING, ED: Glucose-Capillary: 182 mg/dL — ABNORMAL HIGH (ref 70–99)

## 2021-05-16 LAB — LACTIC ACID, PLASMA: Lactic Acid, Venous: 1.9 mmol/L (ref 0.5–1.9)

## 2021-05-16 LAB — PROCALCITONIN: Procalcitonin: 7.16 ng/mL

## 2021-05-16 MED ORDER — ADULT MULTIVITAMIN W/MINERALS CH
1.0000 | ORAL_TABLET | Freq: Every day | ORAL | Status: DC
  Administered 2021-05-17 – 2021-05-23 (×7): 1 via ORAL
  Filled 2021-05-16 (×8): qty 1

## 2021-05-16 MED ORDER — VALPROATE SODIUM 100 MG/ML IV SOLN
400.0000 mg | Freq: Three times a day (TID) | INTRAVENOUS | Status: AC
  Administered 2021-05-16 – 2021-05-22 (×20): 400 mg via INTRAVENOUS
  Filled 2021-05-16 (×24): qty 4

## 2021-05-16 MED ORDER — ENOXAPARIN SODIUM 300 MG/3ML IJ SOLN
20.0000 mg | INTRAMUSCULAR | Status: DC
Start: 2021-05-16 — End: 2021-05-23
  Administered 2021-05-16 – 2021-05-22 (×7): 20 mg via SUBCUTANEOUS
  Filled 2021-05-16 (×8): qty 0.2

## 2021-05-16 MED ORDER — GLUCERNA SHAKE PO LIQD
237.0000 mL | Freq: Three times a day (TID) | ORAL | Status: DC
Start: 2021-05-16 — End: 2021-05-23
  Administered 2021-05-18 – 2021-05-23 (×14): 237 mL via ORAL

## 2021-05-16 MED ORDER — PANCRELIPASE (LIP-PROT-AMYL) 36000-114000 UNITS PO CPEP
72000.0000 [IU] | ORAL_CAPSULE | Freq: Three times a day (TID) | ORAL | Status: DC
  Administered 2021-05-17 – 2021-05-23 (×19): 72000 [IU] via ORAL
  Filled 2021-05-16 (×22): qty 2

## 2021-05-16 MED ORDER — LORAZEPAM 2 MG/ML IJ SOLN
0.5000 mg | Freq: Two times a day (BID) | INTRAMUSCULAR | Status: DC
  Administered 2021-05-16 – 2021-05-19 (×7): 0.5 mg via INTRAVENOUS
  Filled 2021-05-16 (×7): qty 1

## 2021-05-16 NOTE — Progress Notes (Signed)
Initial Nutrition Assessment ? ?DOCUMENTATION CODES:  ?Severe malnutrition in context of chronic illness ? ?INTERVENTION:  ?-Glucerna Shake po TID, each supplement provides 220 kcal and 10 grams of protein ?-MVI with minerals daily ? ?NUTRITION DIAGNOSIS:  ?Severe Malnutrition related to chronic illness (cancer, lupus) as evidenced by severe muscle depletion, severe fat depletion. ? ?GOAL:  ?Patient will meet greater than or equal to 90% of their needs ? ?MONITOR:  ?PO intake, Supplement acceptance, Labs, Weight trends, I & O's, Skin ? ?REASON FOR ASSESSMENT:  ?Consult ?Assessment of nutrition requirement/status ? ?ASSESSMENT:  ?Pt with PMH significant for HTN, type 2 DM, CVA w/ R-sided hemiparesis, COPD, h/o diverticulitis w/ abscess s/p colostomy, anxiety/depression, discoid lupus, HLD, s/p pancreatectomy/splenectomy, recent hospitalization from 2/22-3/28 for DKA, seizure disorder, and COVID-19 infection admitted with AMS and multifocal PNA. Pt with PMH significant for HTN, type 2 DM, CVA w/ R-sided hemiparesis, COPD, h/o diverticulitis w/ abscess s/p colostomy, anxiety/depression, discoid lupus, HLD, s/p pancreatectomy/splenectomy, recent hospitalization from 2/22-3/28 for DKA, seizure disorder, and COVID-19 infection admitted with AMS and multifocal PNA. ? ?Pt sleeping at time of RD visit and did not wake to RD voice/touch. Pt familiar to Clinical Nutrition team from recent admission. Discussed pt with RN. No PO intake documented. Weight history reviewed; no significant weight changes noted. Pt previously did well with meals and Glucerna supplements. Will order again.  ? ?Medications: ? docusate sodium  100 mg Oral BID  ? insulin aspart  0-5 Units Subcutaneous QHS  ? insulin aspart  0-9 Units Subcutaneous TID WC  ? lipase/protease/amylase  72,000 Units Oral TID WC  ? ?Labs: ?Recent Labs  ?Lab 05/15/21 ?1323 05/15/21 ?1355 05/16/21 ?0254  ?NA 145 147* 144  ?K 4.4 4.0 4.4  ?CL 115*  --  110  ?CO2 21*  --  23  ?BUN  14  --  14  ?CREATININE 0.81  --  0.71  ?CALCIUM 8.5*  --  8.8*  ?GLUCOSE 76  --  112*  ?CBGs: 129-182 x24 hours ?  ?NUTRITION - FOCUSED PHYSICAL EXAM: ?Flowsheet Row Most Recent Value  ?Orbital Region Moderate depletion  ?Upper Arm Region Severe depletion  ?Thoracic and Lumbar Region Severe depletion  ?Buccal Region Moderate depletion  ?Temple Region Moderate depletion  ?Clavicle Bone Region Severe depletion  ?Clavicle and Acromion Bone Region Severe depletion  ?Scapular Bone Region Severe depletion  ?Dorsal Hand Severe depletion  ?Patellar Region Severe depletion  ?Anterior Thigh Region Severe depletion  ?Posterior Calf Region Severe depletion  ?Edema (RD Assessment) None  ?Hair Reviewed  ?Eyes Unable to assess  ?Mouth Unable to assess  ?Skin Reviewed  ?Nails Reviewed  ? ?Diet Order:   ?Diet Order   ? ?       ?  DIET DYS 2 Room service appropriate? Yes; Fluid consistency: Thin  Diet effective now       ?  ? ?  ?  ? ?  ? ?EDUCATION NEEDS:  ?Not appropriate for education at this time ? ?Skin:  Skin Assessment: Skin Integrity Issues: ?Skin Integrity Issues:: Other (Comment) ?Other: MASD buttocks ? ?Last BM:  PTA ? ?Height:  ?Ht Readings from Last 1 Encounters:  ?05/15/21 5' (1.524 m)  ? ?Weight:  ?Wt Readings from Last 1 Encounters:  ?05/15/21 44.1 kg  ? ?BMI:  Body mass index is 18.99 kg/m?. ? ?Estimated Nutritional Needs:  ?Kcal:  1400-1600 ?Protein:  70-80 grams ?Fluid:  >1.4L ? ? ? ? ?Theone Stanley., MS, RD, LDN (she/her/hers) ?RD pager number  and weekend/on-call pager number located in [place]. ?

## 2021-05-16 NOTE — Progress Notes (Signed)
? ?NAME:  Brittany [name], MRN:  [id number], DOB:  28-Jun-1960, LOS: 1 ?ADMISSION DATE:  05/15/2021, CONSULTATION DATE:  3/4 ?REFERRING MD:  Lorin Mercy, CHIEF COMPLAINT:  respiratory failure and PNA   ? ?History of Present Illness:  ?This is a 61 year old female who was recently discharged from [facility] on 3/28 after being admitted Towards the end of February with new onset seizure in the setting of DKA, sepsis in the setting of aspiration pneumonia, severe metabolic derangements and acute metabolic encephalopathy.  She was discharged to a skilled nursing facility, as it appears as though prior to admission she was on able to care for herself or her home.  She presented to the emergency room on 4/3 from skilled nursing facility with report of change in mental status.  She apparently was at her baseline cognitive function per staff on the evening hours of 4/6 2 however the morning of 4/3 she was found less interactive, pulse oximetry 60%, and she had increased labored breathing and therefore EMS was called.  In the ER a chest x-ray was obtained that showed fairly significant right-sided airspace disease she required titration of oxygen up to 6 L via nasal cannula to maintain saturations greater than 90%, preliminary significant lab work: Initial lactate 3.1, this rose to 3.5 after initial fluid bolus.  White blood cell count 41.5.  In addition to her 30 mL/kg fluid bolus cultures were sent and antibiotics were started.  Given the patient's frail baseline state, elevated lactate, and oxygen requirements pulmonary/critical care was asked to evaluate. ? ?Pertinent  Medical History  ? ?HTN, diabetes (Insulin dep), recent admit for DKA, new onset seizures, hypertension, history of stroke, status post colostomy, COPD, protein calorie malnutrition, severe, cognitive impairment.  Discoid lupus erythremia, hyperlipidemia. ?Significant Hospital Events: ?Including procedures, antibiotic start and stop dates in addition to other pertinent  events   ?4/3 Admitted w/ sepsis 2/2 PNA (presume HCAP vs aspiration) w/ associated respiratory failure. Vanc,  and zosyn started.  ?4/4 febrile overnight. Seen by  [facility] nurse for pressure ulcers. Lactate was still climbing as of last evening but serum CO2 improving. Down to 5 lpm oxygen.  ? ?Interim History / Subjective:  ?She looks so much better.  ? ?Objective   ?Blood pressure (Abnormal) 161/95, pulse (Abnormal) 102, temperature (Abnormal) 101.9 ?F (38.8 ?C), temperature source Oral, resp. rate 20, height 5' (1.524 m), weight 44.1 kg, SpO2 100 %. ?   ?   ? ?Intake/Output Summary (Last 24 hours) at 05/16/2021 1033 ?Last data filed at 05/15/2021 2134 ?Gross per 24 hour  ?Intake no documentation  ?Output 350 ml  ?Net -350 ml  ? ?Filed Weights  ? 05/15/21 1124  ?Weight: 44.1 kg  ? ? ?Examination: ? ?General no distress. No distress ?HENT NCAT no JVD  ?Pulm decreased O2 needs. No increased WOB. Rhonchi R>L  ?Card rrr ?Abd soft  ?Ext warm  ?Neuro intact more awake.  ? ?Resolved Hospital Problem list   ? ? ?Assessment & Plan:  ?Principal Problem: ?  Multifocal pneumonia ?Active Problems: ?  COPD, mild ([place]) ?  Essential hypertension ?  Intraductal papillary mucinous neoplasm of pancreas ?  Restless leg syndrome ?  Acute metabolic encephalopathy ?  Protein-calorie malnutrition, severe ?  History of stroke ?  HLD (hyperlipidemia) ?  Status post colostomy [facility]) ?  Seizure disorder ([place]) ?  Pressure injury of skin ?  Sepsis ([place]) ?  Diabetes mellitus type 2 in nonobese [facility]) ?  History of COVID-19 ?  Dysphagia ?  Goals of care, counseling/discussion ?  Acute respiratory failure ([place]) ? ?Acute hypoxic respiratory failure in the setting of pneumonia.  Hcap versus aspiration.  Has history of dysphagia ?O2 needs improved.  ?Plan ?Pulse ox ?Wean oxygen  ?Repeat cxr today  ?Day 2 zosyn and vanc ?Swallow eval when clinically better  ? ?Sepsis secondary to pneumonia, c/b lactic acidosis: rising lactate in spite of initial fluid bolus  however on physical exam she remains tachycardic with fairly dry mucous membranes still appears dry ?Plan ?Cont MIVFs ?Abx as above  ?Hold antihypertensive and diuretics for now  ?Repeat LA  ? ?Acute metabolic encephalopathy superimposed on underlying cognitive dysfunction and history of multiple CVAs ?Not quite clear what her baseline cognitive function is.  Currently protecting her airway ?Plan ?Hold sedating drugs ?Treat sepsis  ?Supportive care ? ?Anemia  ?Plan ?Trend cbc ? ?Insulin-dependent diabetes type 2 ?plan ?ssi ? ?All other problems as listed above and as directed by IM service  ?Best Practice (right click and "Reselect all SmartList Selections" daily)  ? ?Diet/type: NPO ?DVT prophylaxis: prophylactic heparin  ?GI prophylaxis: N/A ?Lines: N/A ?Foley:  N/A ?Code Status:  full code ?Last date of multidisciplinary goals of care discussion [pending ] ? We will sign off  ? ?Critical care time: ?NA  ? ?Erick Colace ACNP-BC ?[place] ?Pager # [phone] OR # [phone] if no answer ? ? ? ? ?

## 2021-05-16 NOTE — Consult Note (Signed)
[place] Nurse ostomy follow up ?Patient receiving care in [facility] ED4. ?Stoma type/location: LLQ colostomy ?Stomal assessment/size: 1 1/4 inches, round, budded ?Peristomal assessment: intact ?Treatment options for stomal/peristomal skin: barrier ring ?Output: large amounts of brown/gold effluent ?Ostomy pouching: 2pc. Order equal numbers of the following ostomy supplies: Pouch, Kellie Simmering #234; skin barrier, Kellie Simmering #644; barrier rings, Kellie Simmering [phone]. Order and place at the bedside ONE bottle of stoma powder, Kellie Simmering #6 ? ?[place] Nurse Consult Note: ?Reason for Consult: early pressure ulcers ?Wound type: healing superficial areas on bilateral buttocks; do not appear as PIs ?Pressure Injury POA: NA ?Measurement: ?Wound bed: light pink and large areas of re-pigmentation occurring to bilateral buttocks areas ?Drainage (amount, consistency, odor)  ?Periwound: ?Dressing procedure/placement/frequency: ?Provide routine hygiene care and apply [place] Moisturizing ointment (pink and white tube in clean utility) with each cleansing. ? ?Thank you for the consult.  Discussed plan of care with the patient and bedside nurse.  [place] nurse will not follow at this time.  Please re-consult the [name] team if needed. ? ?Val Riles, RN, MSN, CWOCN, CNS-BC, pager [phone]  ?

## 2021-05-16 NOTE — Hospital Course (Addendum)
Brittany [name] is a 61 year old female with past medical history of essential hypertension, type 2 diabetes, CVA with right-sided hemiparesis, COPD, history of diverticulitis status post abscess and colostomy, anxiety and depression, discoid lupus, hyperlipidemia, s/p pancreatectomy/splenectomy, presented to hospital after she was found to be altered at the skilled nursing facility.  Patient was noted to be little hypoxic at the nursing home and had difficulty holding utensils.  Patient was then sent to the hospital for further evaluation.  Of note, patient does have history of recent prolonged hospitalization 2/22 - 3/28 for DKA, seizure disorder, COVID-19 viral infection and sepsis due to aspiration pneumonia, and behavioral disturbances.  ED patient was febrile with a temperature of 100.3 ?F, tachycardic, tachypneic and was hypoxic with oxygen saturation 90% on 7 L of high flow nasal cannula.  Initial labs showed elevated lactate with WBC at 41.5.  Procalcitonin elevated at 7.1.  CT head scan was reviewed which showed chronic right frontal left parietal occipital and cerebellar infarct.  Chest x-ray with pulmonary infiltrates [place] to multifocal pneumonia with possible right small pleural effusion.  Patient was started on IV antibiotics and was considered for admission to the hospital for acute metabolic encephalopathy secondary to sepsis/multifocal pneumonia, high suspicion for aspiration.  ?

## 2021-05-16 NOTE — Progress Notes (Signed)
?PROGRESS NOTE ? ? ? ?Brittany [name]  YIR:[id number] DOB: 03/30/1960 DOA: 05/15/2021 ?PCP: [facility]  ? ? ?Brief Narrative:  ?Brittany [name] is a 61 year old female with past medical history significant for essential hypertension, type 2 diabetes mellitus, CVA with right-sided hemiparesis, COPD, history of diverticulitis with abscess s/p colostomy, anxiety/depression, discoid lupus, HLD, s/p pancreatectomy/splenectomy, who presented to [facility] ED on 4/3 after being found at [facility] altered.  Per daughter, patient was supposed to be discharged from rehab with plan to return home.  SNF RN noted that patient's oxygen was low and she was drowsy, difficulty holding utensils; and subsequently patient was sent to the ED for further evaluation by EMS. ? ?Patient with recent prolonged hospitalization 2/22 - 3/28 for DKA, seizure disorder, COVID-19 viral infection and sepsis due to aspiration pneumonia, and behavioral disturbances. ? ?In the ED, temperature 100.3 ?F, HR 107, RR 20, BP 119/88, SPO2 90% on 7 L high flow nasal cannula.  Sodium 145, potassium 4.4, chloride 115, CO2 21, glucose 76, BUN 14, creatinine 0.81, AST 27, ALT 14, total bilirubin 0.5.  Lactic acid 3.1.  WBC 41.5, hemoglobin 8.9, platelets 636.  Procalcitonin 7.16.  INR 1.3.  Urinalysis unrevealing.  CT head without contrast with no acute intracranial normality, noted chronic findings right frontal, left parietal/occipital and left cerebellar infarcts, chronic microvascular ischemic changes of the white matter.  Chest x-ray with with bibasilar pulmonary infiltrates consistent with multifocal pneumonia, greater on the right, questionable small right pleural effusion.  Patient was started on empiric antibiotics.  Hospitalist service was consulted for further evaluation management of acute metabolic encephalopathy secondary to sepsis/multifocal pneumonia, high suspicion for aspiration.  ? ? ?  ?Assessment and Plan: ?* Multifocal  pneumonia ?Patient presenting to the ED with altered mental status.  Patient was noted to have elevated temperature of 100.3, WBC count 41.5, and chest x-ray findings of multifocal pneumonia.  Patient was also significantly hypoxic requiring 7 L high flow nasal cannula on arrival.  Also with tachycardia, tachypnea and evidence of acute organ failure with elevated lactic acid of 3.1.  Urinalysis unrevealing. ?--WBC 41.5>37.1 ?--Blood cultures x2: Pending ?--Urine culture: Pending ?--MRSA PCR negative, will discontinue vancomycin ?--Continue Zosyn ?--Continuous home oxygen, maintain SPO2 greater than 92%, currently on 4 L nasal cannula ?--CBC daily ?--Palliative care consulted for recurrence and prolonged hospitalization ?--Aspiration precautions ? ?Acute metabolic encephalopathy ?Patient presenting from SNF with altered mental status.  Recent hospitalization for new onset seizures and DKA.  On arrival, she was mildly impaired but able to answer some limited questions, unlikely secondary to recurrent seizures.  Patient with elevated WBC count of 41.5 with chest x-ray findings of multifocal pneumonia consistent with likely aspiration.  Etiology of her encephalopathy likely secondary to sepsis from aspiration pneumonia. ?--Delirium precautions ?--Antibiotics as below ? ?Sepsis ([place]) ?Patient presenting to the ED with altered mental status.  Patient was noted to have elevated temperature of 100.3, WBC count 41.5, and chest x-ray findings of multifocal pneumonia.  Patient was also significantly hypoxic requiring 7 L high flow nasal cannula on arrival.  Also with tachycardia, tachypnea and evidence of acute organ failure with elevated lactic acid of 3.1.  Urinalysis unrevealing. ?--WBC 41.5>37.1 ?--Blood cultures x2: Pending ?--Urine culture: Pending ?--MRSA PCR negative, will discontinue vancomycin ?--Continue Zosyn ?--Continuous home oxygen, maintain SPO2 greater than 92%, currently on 4 L nasal cannula ?--CBC  daily ? ?Acute respiratory failure ([place]) ?Patient presenting with shortness of breath, altered mental status and found  to have multifocal pneumonia on imaging.  Etiology likely secondary to recurrent aspiration event.  Requiring 7 L high flow nasal cannula on arrival. ?-- Continue supplemental oxygen, maintain SPO2 greater than 92% ?-- Antibiotics as above ? ?Seizure disorder ([place]) ?Previously diagnosed during last hospitalization. She was initially started on Keppra but this was changed to Depakote per neurology and psychiatry due to behavioral disturbances.  Valproic acid level low at 28.  Home regimen includes Depakote 1250 mg nightly, Klonopin 0.25 mg p.o. twice daily. ?--Depakote 400 mg IV every 8 hours ?--Ativan 0.5 mg IV every 12 hours ?--Olanzapine 7.5 mg qHS ? ?Essential hypertension ?Not currently on home Antivert tenses. ?--Hydralazine IV PRN ? ?Restless leg syndrome ?--Continue Requip ? ?History of stroke ?Patient with history of prior CVA with resultant right-sided hemiparesis. ?--Atorvastatin 40 mg p.o. daily ?--Not on aspirin or Plavix as outpatient ? ? ?Status post colostomy [facility]) ?Hx diverticulitis with abscess s/p colostomy ?--Wound care consulted for assistance with this as well as early pressure ulcers present on admission ? ?COPD, mild ([place]) ?--Mucinex 600 mg BID PRN cough ?-- Albuterol neb as needed shortness of breath/wheezing ? ?Protein-calorie malnutrition, severe ?Body mass index is 18.99 kg/m?Marland Kitchen  ?Nutrition Problem: Severe Malnutrition ?Etiology: chronic illness (cancer, lupus) ?Signs/Symptoms: severe muscle depletion, severe fat depletion, percent weight loss ?Percent weight loss: 16.5 % (x5 months) ?--Dietitian consult ?--Seen by SLP, dysphagia 2 diet with thin liquids; aspiration precautions ? ?Pressure injury of skin ?--Wound care consulted ?--Frequent offloading ? ? ?Goals of care, counseling/discussion ?Code status reviewed with family, full code at this time.  Patient with recurrent  and prolonged hospitalizations ?--palliative care consult pending ? ?Dysphagia ?Patient with history of CVA, has received tube feeds in the past during prior hospitalization related to malnutrition, n.p.o. status from altered mental status/DKA.  Evaluated by speech therapy on this admission and recommended dysphagia 2 diet with thin liquids.  High concern for aspiration given multifocal pneumonia on imaging. ?-- Dysphagia 2 diet with thin liquids ?-- Aspiration precautions ? ? ?History of COVID-19 ?Positive on 2/22, completed isolation. ? ? ?Diabetes mellitus type 2 in nonobese [facility]) ?Recent hospitalized with DKA.  Home regimen includes Semglee 20 units twice daily, insulin sliding scale.  Recent hemoglobin A1c 15, indicating poor control. ?-- Holding long-acting insulin given AMS with poor intake ?-- Now cleared for dysphagia 2 diet by SLP ?--SSI for coverage ?--CBGs qAC/HS ? ?HLD (hyperlipidemia) ?--Atorvastatin 40 mg p.o. daily ? 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 ? ? ? ? ? ?DVT prophylaxis: enoxaparin (LOVENOX) injection 40 mg Start: 05/15/21 1700 ? ?  Code Status: Full Code ?Family Communication: No family present at bedside this morning ? ?Disposition Plan:  ?Level of care: Progressive ?Status is: Inpatient ?Remains inpatient appropriate because: Remains on supplemental oxygen which she is not on at baseline, continues on IV antibiotics, will need reevaluation by PT/OT likely tomorrow and awaiting palliative care consult.  Ultimately poor prognosis with recurrent hospitalizations. ?  ? ?Consultants:  ?PCCM -signed off 4/4 ? ?Procedures:  ?None ? ?Antimicrobials:  ?Vancomycin 4/3 - 4/4 ?Zosyn 4/3>> ? ? ?Subjective: ?Patient seen examined bedside, resting comfortably.  Remains in ED holding area.  Remains pleasantly confused.  Oxygen now down to 4 L nasal cannula, was previously on 7 L high flow  nasal cannula.  No family present.  Discussed with RN at bedside.  Seen by speech therapy with recommendation of dysphagia 2  diet with thin liquids.  Patient requesting something to drink this morning.  No other questions or c

## 2021-05-16 NOTE — Progress Notes (Signed)
Arrived to unit, Yellow MEWS.  ? ? 05/16/21 0902  ?Assess: MEWS Score  ?Temp (!) 101.9 ?F (38.8 ?C)  ?BP (!) 161/95  ?Pulse Rate (!) 102  ?Resp 20  ?SpO2 100 %  ?O2 Device Nasal Cannula  ?O2 Flow Rate (L/min) 5 L/min  ?Assess: MEWS Score  ?MEWS Temp 2  ?MEWS Systolic 0  ?MEWS Pulse 1  ?MEWS RR 0  ?MEWS LOC 0  ?MEWS Score 3  ?MEWS Score Color Yellow  ?Assess: if the MEWS score is Yellow or Red  ?Were vital signs taken at a resting state? Yes  ?Focused Assessment No change from prior assessment  ?Early Detection of Sepsis Score *See Row Information* High  ?MEWS guidelines implemented *See Row Information* Yes  ?Take Vital Signs  ?Increase Vital Sign Frequency  Yellow: Q 2hr X 2 then Q 4hr X 2, if remains yellow, continue Q 4hrs  ?Escalate  ?MEWS: Escalate Yellow: discuss with charge nurse/RN and consider discussing with provider and RRT  ?Notify: Charge Nurse/RN  ?Name of Charge Nurse/RN Notified [place] RN  ?Date Charge Nurse/RN Notified 05/16/21  ?Time Charge Nurse/RN Notified [phone]  ?Document  ?Progress note created (see row info) Yes  ? ? ?

## 2021-05-16 NOTE — Evaluation (Signed)
Clinical/Bedside Swallow Evaluation ?Patient Details  ?Name: Brittany [name] ?MRN: [id number] ?Date of Birth: [birth year] ? ?Today's Date: 05/16/2021 ?Time: SLP Start Time (ACUTE ONLY): 0930 SLP Stop Time (ACUTE ONLY): 1000 ?SLP Time Calculation (min) (ACUTE ONLY): 30 min ? ?Past Medical History:  ?Past Medical History:  ?Diagnosis Date  ? Colostomy in place [facility])   ? h/o diverticulitis with abscess  ? Diabetes mellitus without complication ([place])   ? Dyslipidemia   ? Hypertension   ? Lupus ([place])   ? Pancreatic cancer ([place])   ? RLS (restless legs syndrome)   ? Seizure disorder ([place])   ? 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 ?Past Surgical History:  ?Past Surgical History:  ?Procedure Laterality Date  ? COLECTOMY WITH COLOSTOMY CREATION/HARTMANN PROCEDURE N/A 03/10/2021  ? Procedure: COLECTOMY WITH COLOSTOMY CREATION/HARTMANN PROCEDURE;  Surgeon: Jules Husbands, MD;  Location: [facility] ORS;  Service: General;  Laterality: N/A;  ? PANCREATECTOMY    ? spleenectomy    ? ?HPI:  ?Patient is a 61 y.o. female with PMH: HTN, DM, CVA with right hemiparesis, COPD s/p pancreatectomy/splenectomy, diverticulitis with abscess leading to colostomy (03/10/21), anxiety, depression, HLD, discoid lupus, recently hospitalized 2/22-3/28 for new onset seizures with sepsis due to aspiration PNA and DKA and was also COVID+. She had discharged 3/28 to [facility] but returned to hospital 4/3 with AMS. In ED she presented with gurgling, hypoxia, multifocal PNA on CXR.  ?  ?Assessment / Plan / Recommendation  ?Clinical Impression ? Patient presents with clinical s/s of dysphagia as per this bedside/clinical swallow evaluation. Patient initially did not produce a voice when mouthing that she wanted water, however during oral care she had several productive coughs resulting in thick clear-whitish secretions that SLP suctioned out of mouth and after that she produced a strong, clear sounding voice.  No secretions were  observed in oral cavity prior to oral care. Patient then consumed cup sips of water (was unable to use straw), spoon bites of puree solids (apple sauce) and 1/2 saltine cracker. She exhibited intermittent delayed coughing with water however majority of these times cough was productive and secretions were expectorated. Voice was strong and clear prior to and after productive coughing episodes. She exhibited prolonged mastication with dys 3 solids. SLP is recommending initiate diet of Dys 2 (fine chop), thin liquids and will follow for toleration and ability to advance. ?SLP Visit Diagnosis: Dysphagia, unspecified (R13.10) ?   ?Aspiration Risk ? Mild aspiration risk  ?  ?Diet Recommendation Dysphagia 2 (Fine chop);Thin liquid  ? ?Liquid Administration via: Cup;No straw ?Medication Administration: Whole meds with puree ?Supervision: Patient able to self feed;Full supervision/cueing for compensatory strategies;Staff to assist with self feeding ?Compensations: Minimize environmental distractions;Slow rate;Small sips/bites ?Postural Changes: Seated upright at 90 degrees  ?  ?Other  Recommendations Oral Care Recommendations: Oral care BID;Staff/trained caregiver to provide oral care   ? ?Recommendations for follow up therapy are one component of a multi-disciplinary discharge planning process, led by the attending physician.  Recommendations may be updated based on patient status, additional functional criteria and insurance authorization. ? ?Follow up Recommendations Skilled nursing-short term rehab (<3 hours/day)  ? ? ?  ?Assistance Recommended at Discharge Frequent or constant Supervision/Assistance  ?Functional Status Assessment Patient has had a recent decline in their functional status and demonstrates the ability to make significant improvements in function in a reasonable and predictable amount of time.  ?Frequency and Duration min 2x/week  ?1 week ?  ?   ? ?  Prognosis Prognosis for Safe Diet Advancement:  Good ?Barriers to Reach Goals: Cognitive deficits  ? ?  ? ?Swallow Study   ?General Date of Onset: 05/16/21 ?HPI: Patient is a 61 y.o. female with PMH: HTN, DM, CVA with right hemiparesis, COPD s/p pancreatectomy/splenectomy, diverticulitis with abscess leading to colostomy (03/10/21), anxiety, depression, HLD, discoid lupus, recently hospitalized 2/22-3/28 for new onset seizures with sepsis due to aspiration PNA and DKA and was also COVID+. She had discharged 3/28 to [facility] but returned to hospital 4/3 with AMS. In ED she presented with gurgling, hypoxia, multifocal PNA on CXR. ?Type of Study: Bedside Swallow Evaluation ?Previous Swallow Assessment: BSE during admission last month ?Diet Prior to this Study: NPO ?Temperature Spikes Noted: Yes (101.9) ?Respiratory Status: Nasal cannula ?History of Recent Intubation: No ?Behavior/Cognition: Alert;Cooperative;Pleasant mood ?Oral Cavity Assessment: Within Functional Limits ?Oral Care Completed by SLP: Yes ?Oral Cavity - Dentition: Missing dentition;Poor condition ?Vision: Functional for self-feeding ?Self-Feeding Abilities: Able to feed self;Needs set up;Needs assist;Other (Comment) (some difficulty with holding plastic spoon but able to manage) ?Baseline Vocal Quality: Normal ?Volitional Cough: Strong;Congested ?Volitional Swallow: Able to elicit  ?  ?Oral/Motor/Sensory Function Overall Oral Motor/Sensory Function: Within functional limits   ?Ice Chips     ?Thin Liquid Thin Liquid: Impaired ?Presentation: Cup;Straw;Self Fed ?Pharyngeal  Phase Impairments: Suspected delayed Swallow;Cough - Delayed  ?  ?Nectar Thick     ?Honey Thick     ?Puree Puree: Within functional limits ?Presentation: Self Fed;Spoon   ?Solid ? ? ?  Solid: Impaired ?Oral Phase Impairments: Impaired mastication  ? ?  ? ?Sonia Baller, MA, CCC-SLP ?Speech Therapy ? ? ?

## 2021-05-16 NOTE — Progress Notes (Signed)
Arrived to 3W10 from Emergency Department escorted by RN and Nurse Tech from Emergency Department.  ?2 RN Skin assessment completed with Val Riles RN WOC.  ?Patient does not have any open wounds or pressure injuries noted upon assessment. Healed sacral and buttocks wounds noted. See [place] Nurse documentation for details.  ?Oriented to room and provided education. Patient is lethargic and sleepy upon assessment.  ?

## 2021-05-16 NOTE — Progress Notes (Signed)
PT Cancellation Note ? ?Patient Details ?Name: GLORIANN RIEDE ?MRN: [id number] ?DOB: 03-20-1960 ? ? ?Cancelled Treatment:    Reason Eval/Treat Not Completed: Fatigue/lethargy limiting ability to participate-Per RN report. PT to follow up acutely as able.  ? ?Thermon Leyland, SPT ?Acute Rehab Services ? ? ? ?Thermon Leyland ?05/16/2021, 1:28 PM ?

## 2021-05-16 NOTE — Assessment & Plan Note (Signed)
Patient presenting with shortness of breath, altered mental status and found to have multifocal pneumonia on imaging.  Etiology likely secondary to recurrent aspiration event.  Requiring 7 L high flow nasal cannula on arrival. ?-- Continue supplemental oxygen, maintain SPO2 greater than 92% ?-- Antibiotics as above ?

## 2021-05-16 NOTE — Assessment & Plan Note (Signed)
Patient presenting to the ED with altered mental status.  Patient was noted to have elevated temperature of 100.3, WBC count 41.5, and chest x-ray findings of multifocal pneumonia.  Patient was also significantly hypoxic requiring 7 L high flow nasal cannula on arrival.  Also with tachycardia, tachypnea and evidence of acute organ failure with elevated lactic acid of 3.1.  Urinalysis unrevealing. ?--WBC 41.5>37.1 ?--Blood cultures x2: Pending ?--Urine culture: Pending ?--MRSA PCR negative, will discontinue vancomycin ?--Continue Zosyn ?--Continuous home oxygen, maintain SPO2 greater than 92%, currently on 4 L nasal cannula ?--CBC daily ?

## 2021-05-16 NOTE — Progress Notes (Signed)
?   05/16/21 1314  ?Assess: MEWS Score  ?Temp 98.9 ?F (37.2 ?C)  ?BP (!) 153/97  ?Pulse Rate (!) 102  ?ECG Heart Rate (!) 103  ?Resp (!) 22  ?SpO2 97 %  ?Assess: MEWS Score  ?MEWS Temp 0  ?MEWS Systolic 0  ?MEWS Pulse 1  ?MEWS RR 1  ?MEWS LOC 0  ?MEWS Score 2  ?MEWS Score Color Yellow  ?Assess: if the MEWS score is Yellow or Red  ?Were vital signs taken at a resting state? Yes  ?Focused Assessment No change from prior assessment  ?Early Detection of Sepsis Score *See Row Information* High  ?MEWS guidelines implemented *See Row Information* (S)  No, previously yellow, continue vital signs every 4 hours  ?Document  ?Progress note created (see row info) Yes  ? ? ?

## 2021-05-16 NOTE — Significant Event (Signed)
Rapid Response Event Note  ? ?Reason for Call :  ?Second set of eyes ? ?Initial Focused Assessment:  ?Pt lying in bed, resting. Oriented to person. Skin is warm, dry. She follows commands. She has a productive cough and a strong swallow. Sputum is white, moderate in amount. Lung sounds are rhonchus, but improves with clearing of secretions. She is able to follow commands. She appears generally weak and needs assistance with basic needs. ? ?VS: T 98.75F, BP 150/98, HR 98, RR 18, SPO2 99% on 4LNC ? ?Interventions:  ?-Flutter valve placed at bedside- encouraged and evaluated pt use of flutter valve ? ?Plan of Care:  ?-Pulmonary hygiene: encourage cough and deep breathing, flutter valve, incentive spirometry, HOB > 30 degrees, turning and repositioning, OOB as tolerated ? ?Call rapid response for additional needs ? ?Event Summary:  ?No acute change in patient status- nurse to nurse bedside evaluation  ?Call Time: 1513 ?Arrival Time: 5621 ?End Time: 3086 ? ?Casimer Bilis, RN ?

## 2021-05-16 NOTE — ED Notes (Signed)
Pt colostomy bag emptied ?

## 2021-05-16 NOTE — Assessment & Plan Note (Signed)
--  Wound care consulted ?--Frequent offloading ? ?

## 2021-05-16 NOTE — ED Notes (Signed)
ED TO INPATIENT HANDOFF REPORT ? ?ED Nurse Name and Phone #: [phone] ? ? ?S ?Name/Age/Gender ?Brittany [name] ?61 y.o. ?female ?Room/Bed: 004C/004C ? ?Code Status ?  Code Status: Full Code ? ?Home/SNF/Other ?Rehab ?Patient oriented to: self ?Is this baseline? No  ? ?   ?Chief Complaint ?Acute metabolic encephalopathy [V69.45] ?Multifocal pneumonia [J18.9] ? ?Triage Note ?Patient presents to ed via [facility] states patient is AMS last seen her normal 10 pm . More lethargic today   ? ?Allergies ?Allergies  ?Allergen Reactions  ? Cephalosporins Itching  ?  TOLERATED ZOSYN (PIPERACILLIN) BEFORE  ? Hydromorphone Itching  ? Keflin [Cephalothin] Itching  ? Lactose Intolerance (Gi) Diarrhea  ? ? ?Level of Care/Admitting Diagnosis ?ED Disposition   ? ? ED Disposition  ?Admit  ? Condition  ?--  ? Comment  ?Hospital [facility]: [facility] [[id number]] ? Level of Care: Progressive [102] ? Admit to Progressive based on following criteria: MULTISYSTEM THREATS such as stable sepsis, metabolic/electrolyte imbalance with or without encephalopathy that is responding to early treatment. ? May admit patient to [facility] or [facility] if equivalent level of care is available:: Yes ? Covid Evaluation: Recent COVID positive no isolation required infection day 21-90 ? Diagnosis: Acute metabolic encephalopathy [[id number]] ? Admitting Physician: Karmen Bongo [[id number]] ? Attending Physician: Karmen Bongo [[id number]] ? Estimated length of stay: 3 - 4 days ? Certification:: I certify this patient will need inpatient services for at least 2 midnights ?  ?  ? ?  ? ? ?B ?Medical/Surgery History ?Past Medical History:  ?Diagnosis Date  ? Colostomy in place [facility])   ? h/o diverticulitis with abscess  ? Diabetes mellitus without complication ([place])   ? Dyslipidemia   ? Hypertension   ? Lupus ([place])   ? Pancreatic cancer ([place])   ? RLS (restless legs syndrome)   ? Seizure disorder ([place])   ? 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 ?Past Surgical History:  ?Procedure Laterality Date  ? COLECTOMY WITH COLOSTOMY CREATION/HARTMANN PROCEDURE N/A 03/10/2021  ? Procedure: COLECTOMY WITH COLOSTOMY CREATION/HARTMANN PROCEDURE;  Surgeon: Jules Husbands, MD;  Location: [facility] ORS;  Service: General;  Laterality: N/A;  ? PANCREATECTOMY    ? spleenectomy    ?  ? ?A ?IV Location/Drains/Wounds ?Patient Lines/Drains/Airways Status   ? ? Active Line/Drains/Airways   ? ? Name Placement date Placement time Site Days  ? Peripheral IV 05/15/21 20 G Distal;Left;Lateral Wrist 05/15/21  1131  Wrist  1  ? Peripheral IV 05/15/21 22 G Left Hand 05/15/21  1549  Hand  1  ? Colostomy RLQ 03/10/21  0942  RLQ  67  ? Incision (Closed) 03/10/21 Abdomen 03/10/21  0950  -- 67  ? Incision (Closed) 03/10/21 Groin Left 03/10/21  0950  -- 67  ? Wound / Incision (Open or Dehisced) 03/05/21 Incision - Open Abdomen Left;Lower Oen Abcess 03/05/21  1645  Abdomen  72  ? Wound / Incision (Open or Dehisced) 04/12/21 (MASD) Moisture Associated Skin Damage Buttocks Right;Left red, small hole 04/12/21  0830  Buttocks  34  ? ?  ?  ? ?  ? ? ?Intake/Output Last 24 hours ? ?Intake/Output Summary (Last 24 hours) at 05/16/2021 0751 ?Last data filed at 05/15/2021 2134 ?Gross per 24 hour  ?Intake --  ?Output 350 ml  ?Net -350 ml  ? ? ?Labs/Imaging ?Results for orders placed or performed during the hospital encounter of 05/15/21 (from the past 48 hour(s))  ?  CBG monitoring, ED     Status: Abnormal  ? Collection Time: 05/15/21 11:28 AM  ?Result Value Ref Range  ? Glucose-Capillary 67 (L) 70 - 99 mg/dL  ?  Comment: Glucose reference range applies only to samples taken after fasting for at least 8 hours.  ?Lactic acid, plasma     Status: Abnormal  ? Collection Time: 05/15/21  1:23 PM  ?Result Value Ref Range  ? Lactic Acid, Venous 3.1 (HH) 0.5 - 1.9 mmol/L  ?  Comment: CRITICAL RESULT CALLED TO, READ BACK BY AND VERIFIED WITH: ?K.MOON,RN 05/15/2021 AT 1413 A.HUGHES ?Performed at [place], [place] [address], [place], [place] [address] ?  ?Comprehensive metabolic panel     Status: Abnormal  ? Collection Time: 05/15/21  1:23 PM  ?Result Value Ref Range  ? Sodium 145 135 - 145 mmol/L  ? Potassium 4.4 3.5 - 5.1 mmol/L  ? Chloride 115 (H) 98 - 111 mmol/L  ? CO2 21 (L) 22 - 32 mmol/L  ? Glucose, Bld 76 70 - 99 mg/dL  ?  Comment: Glucose reference range applies only to samples taken after fasting for at least 8 hours.  ? BUN 14 8 - 23 mg/dL  ? Creatinine, Ser 0.81 0.44 - 1.00 mg/dL  ? Calcium 8.5 (L) 8.9 - 10.3 mg/dL  ? Total Protein 7.4 6.5 - 8.1 g/dL  ? Albumin 2.0 (L) 3.5 - 5.0 g/dL  ? AST 27 15 - 41 U/L  ? ALT 14 0 - 44 U/L  ? Alkaline Phosphatase 73 38 - 126 U/L  ? Total Bilirubin 0.5 0.3 - 1.2 mg/dL  ? GFR, Estimated >60 >60 mL/min  ?  Comment: (NOTE) ?Calculated using the CKD-EPI Creatinine Equation (2021) ?  ? Anion gap 9 5 - 15  ?  Comment: Performed at [facility], [place] [address], [place], [place] [address]  ?CBC WITH DIFFERENTIAL     Status: Abnormal  ? Collection Time: 05/15/21  1:23 PM  ?Result Value Ref Range  ? WBC 41.5 (H) 4.0 - 10.5 K/uL  ? RBC 3.05 (L) 3.87 - 5.11 MIL/uL  ? Hemoglobin 8.9 (L) 12.0 - 15.0 g/dL  ? HCT 29.0 (L) 36.0 - 46.0 %  ? MCV 95.1 80.0 - 100.0 fL  ? MCH 29.2 26.0 - 34.0 pg  ? MCHC 30.7 30.0 - 36.0 g/dL  ? RDW 17.8 (H) 11.5 - 15.5 %  ? Platelets 636 (H) 150 - 400 K/uL  ? nRBC 0.1 0.0 - 0.2 %  ? Neutrophils Relative % 83 %  ? Neutro Abs 34.4 (H) 1.7 - 7.7 K/uL  ? Lymphocytes Relative 11 %  ? Lymphs Abs 4.5 (H) 0.7 - 4.0 K/uL  ? Monocytes Relative 4 %  ? Monocytes Absolute 1.8 (H) 0.1 - 1.0 K/uL  ? Eosinophils Relative 0 %  ? Eosinophils Absolute 0.0 0.0 - 0.5 K/uL  ? Basophils Relative 0 %  ? Basophils Absolute 0.1 0.0 - 0.1 K/uL  ? RBC Morphology MORPHOLOGY UNREMARKABLE   ? Immature Granulocytes 2 %  ? Abs Immature Granulocytes 0.69 (H) 0.00 - 0.07 K/uL  ?  Comment: Performed at [facility], [place] [address] [place], [place] [address]  ?Protime-INR     Status:  Abnormal  ? Collection Time: 05/15/21  1:23 PM  ?Result Value Ref Range  ? Prothrombin Time 16.5 (H) 11.4 - 15.2 seconds  ? INR 1.3 (H) 0.8 - 1.2  ?  Comment: (NOTE) ?INR  goal varies based on device and disease states. ?Performed at [facility], [place] [address], [place], [place] ?[address] ?  ?APTT     Status: None  ? Collection Time: 05/15/21  1:23 PM  ?Result Value Ref Range  ? aPTT 31 24 - 36 seconds  ?  Comment: Performed at [facility], [place] [address] [place], [place] [address]  ?MRSA Next Gen by PCR, Nasal     Status: None  ? Collection Time: 05/15/21  1:25 PM  ? Specimen: Nasal Mucosa; Nasal Swab  ?Result Value Ref Range  ? MRSA by PCR Next Gen NOT DETECTED NOT DETECTED  ?  Comment: (NOTE) ?The GeneXpert MRSA Assay (FDA approved for NASAL specimens only), ?is one component of a comprehensive MRSA colonization surveillance ?program. It is not intended to diagnose MRSA infection nor to guide ?or monitor treatment for MRSA infections. ?Test performance is not FDA approved in patients less than 2 years ?old. ?Performed at [facility], [place] [address], [place], [place] ?[address] ?  ?I-Stat arterial blood gas, [facility] ED)     Status: Abnormal  ? Collection Time: 05/15/21  1:55 PM  ?Result Value Ref Range  ? pH, Arterial 7.448 7.35 - 7.45  ? pCO2 arterial 30.5 (L) 32 - 48 mmHg  ? pO2, Arterial 48 (L) 83 - 108 mmHg  ? Bicarbonate 20.9 20.0 - 28.0 mmol/L  ? TCO2 22 22 - 32 mmol/L  ? O2 Saturation 84 %  ? Acid-base deficit 2.0 0.0 - 2.0 mmol/L  ? Sodium 147 (H) 135 - 145 mmol/L  ? Potassium 4.0 3.5 - 5.1 mmol/L  ? Calcium, Ion 1.21 1.15 - 1.40 mmol/L  ? HCT 27.0 (L) 36.0 - 46.0 %  ? Hemoglobin 9.2 (L) 12.0 - 15.0 g/dL  ? Patient temperature 100.3 F   ? Collection site RADIAL, ALLEN'S TEST ACCEPTABLE   ? Drawn by RT   ? Sample type ARTERIAL   ?Urinalysis, Routine w reflex microscopic Urine, Catheterized     Status: Abnormal  ? Collection Time: 05/15/21  2:58 PM  ?Result Value Ref Range  ? Color, Urine  YELLOW YELLOW  ? APPearance HAZY (A) CLEAR  ? Specific Gravity, Urine 1.021 1.005 - 1.030  ? pH 5.0 5.0 - 8.0  ? Glucose, UA NEGATIVE NEGATIVE mg/dL  ? Hgb urine dipstick NEGATIVE NEGATIVE  ? Bilirubin Urine

## 2021-05-16 NOTE — Progress Notes (Signed)
Ok to reduce Lovenox to '20mg'$  SQ qday due to wt of 44kg per Dr. [place] ? ?Onnie Boer, PharmD, BCIDP, AAHIVP, CPP ?Infectious Disease Pharmacist ?05/16/2021 2:11 PM ? ? ?

## 2021-05-16 NOTE — ED Notes (Signed)
Called floor to initiate purple man. 

## 2021-05-17 DIAGNOSIS — Z933 Colostomy status: Secondary | ICD-10-CM

## 2021-05-17 DIAGNOSIS — R1310 Dysphagia, unspecified: Secondary | ICD-10-CM

## 2021-05-17 DIAGNOSIS — R6520 Severe sepsis without septic shock: Secondary | ICD-10-CM

## 2021-05-17 DIAGNOSIS — I10 Essential (primary) hypertension: Secondary | ICD-10-CM

## 2021-05-17 DIAGNOSIS — G40909 Epilepsy, unspecified, not intractable, without status epilepticus: Secondary | ICD-10-CM

## 2021-05-17 DIAGNOSIS — Z8616 Personal history of COVID-19: Secondary | ICD-10-CM

## 2021-05-17 DIAGNOSIS — E119 Type 2 diabetes mellitus without complications: Secondary | ICD-10-CM

## 2021-05-17 DIAGNOSIS — E785 Hyperlipidemia, unspecified: Secondary | ICD-10-CM

## 2021-05-17 DIAGNOSIS — D490 Neoplasm of unspecified behavior of digestive system: Secondary | ICD-10-CM

## 2021-05-17 DIAGNOSIS — Z8673 Personal history of transient ischemic attack (TIA), and cerebral infarction without residual deficits: Secondary | ICD-10-CM

## 2021-05-17 DIAGNOSIS — J449 Chronic obstructive pulmonary disease, unspecified: Secondary | ICD-10-CM

## 2021-05-17 DIAGNOSIS — G9340 Encephalopathy, unspecified: Secondary | ICD-10-CM

## 2021-05-17 DIAGNOSIS — G2581 Restless legs syndrome: Secondary | ICD-10-CM

## 2021-05-17 DIAGNOSIS — Z515 Encounter for palliative care: Secondary | ICD-10-CM

## 2021-05-17 DIAGNOSIS — Z7189 Other specified counseling: Secondary | ICD-10-CM

## 2021-05-17 DIAGNOSIS — E43 Unspecified severe protein-calorie malnutrition: Secondary | ICD-10-CM

## 2021-05-17 LAB — BASIC METABOLIC PANEL
Anion gap: 6 (ref 5–15)
BUN: 11 mg/dL (ref 8–23)
CO2: 28 mmol/L (ref 22–32)
Calcium: 8.4 mg/dL — ABNORMAL LOW (ref 8.9–10.3)
Chloride: 102 mmol/L (ref 98–111)
Creatinine, Ser: 0.76 mg/dL (ref 0.44–1.00)
GFR, Estimated: >60 mL/min (ref >60)
Glucose, Bld: 120 mg/dL — ABNORMAL HIGH (ref 70–99)
Potassium: 3 mmol/L — ABNORMAL LOW (ref 3.5–5.1)
Sodium: 136 mmol/L (ref 135–145)

## 2021-05-17 LAB — CBC
HCT: 25.8 % — ABNORMAL LOW (ref 36.0–46.0)
Hemoglobin: 8.4 g/dL — ABNORMAL LOW (ref 12.0–15.0)
MCH: 29.6 pg (ref 26.0–34.0)
MCHC: 32.6 g/dL (ref 30.0–36.0)
MCV: 90.8 fL (ref 80.0–100.0)
Platelets: 461 10*3/uL — ABNORMAL HIGH (ref 150–400)
RBC: 2.84 MIL/uL — ABNORMAL LOW (ref 3.87–5.11)
RDW: 17.2 % — ABNORMAL HIGH (ref 11.5–15.5)
WBC: 22.8 10*3/uL — ABNORMAL HIGH (ref 4.0–10.5)
nRBC: 0.1 % (ref 0.0–0.2)

## 2021-05-17 LAB — LACTIC ACID, PLASMA: Lactic Acid, Venous: 1.2 mmol/L (ref 0.5–1.9)

## 2021-05-17 LAB — GLUCOSE, CAPILLARY
Glucose-Capillary: 145 mg/dL — ABNORMAL HIGH (ref 70–99)
Glucose-Capillary: 211 mg/dL — ABNORMAL HIGH (ref 70–99)
Glucose-Capillary: 255 mg/dL — ABNORMAL HIGH (ref 70–99)
Glucose-Capillary: 197 mg/dL — ABNORMAL HIGH (ref 70–99)

## 2021-05-17 LAB — MAGNESIUM: Magnesium: 1.4 mg/dL — ABNORMAL LOW (ref 1.7–2.4)

## 2021-05-17 LAB — PROCALCITONIN: Procalcitonin: 5.13 ng/mL

## 2021-05-17 LAB — PHOSPHORUS: Phosphorus: 4.3 mg/dL (ref 2.5–4.6)

## 2021-05-17 MED ORDER — POTASSIUM CHLORIDE 10 MEQ/100ML IV SOLN
10.0000 meq | INTRAVENOUS | Status: AC
  Administered 2021-05-17 (×3): 10 meq via INTRAVENOUS
  Filled 2021-05-17 (×3): qty 100

## 2021-05-17 MED ORDER — POTASSIUM CHLORIDE 2 MEQ/ML IV SOLN
INTRAVENOUS | Status: DC
Start: 2021-05-17 — End: 2021-05-18
  Filled 2021-05-17 (×3): qty 1000

## 2021-05-17 MED ORDER — POTASSIUM CHLORIDE 10 MEQ/100ML IV SOLN
INTRAVENOUS | Status: AC
  Administered 2021-05-17: 10 meq via INTRAVENOUS
  Filled 2021-05-17: qty 100

## 2021-05-17 MED ORDER — SODIUM CHLORIDE 0.9 % IV SOLN: INTRAVENOUS | Status: DC

## 2021-05-17 MED ORDER — MAGNESIUM SULFATE 2 GM/50ML IV SOLN
2.0000 g | Freq: Once | INTRAVENOUS | Status: AC
Start: 2021-05-17 — End: 2021-05-17
  Administered 2021-05-17: 2 g via INTRAVENOUS
  Filled 2021-05-17: qty 50

## 2021-05-17 MED ORDER — MORPHINE SULFATE (PF) 2 MG/ML IV SOLN: 2.0000 mg | INTRAVENOUS | Status: DC | PRN

## 2021-05-17 NOTE — Plan of Care (Signed)
?  Problem: Activity: ?Goal: Ability to tolerate increased activity will improve ?Outcome: Progressing ?  ?Problem: Clinical Measurements: ?Goal: Will remain free from infection ?Outcome: Progressing ?Goal: Diagnostic test results will improve ?Outcome: Progressing ?Goal: Respiratory complications will improve ?Outcome: Progressing ?  ?

## 2021-05-17 NOTE — Progress Notes (Signed)
Speech Language Pathology Treatment: Dysphagia  ?Patient Details ?Name: Brittany [name] ?MRN: [id number] ?DOB: 1960-06-09 ?Today's Date: 05/17/2021 ?Time: 9767-3419 ?SLP Time Calculation (min) (ACUTE ONLY): 14 min ? ?Assessment / Plan / Recommendation ?Clinical Impression ? Pt seen with part of breakfast meal and intermittently falling asleep needing cues to stay awake and at one point removed her tray. She exhibited mildly prolonged mastication and transit with minimal scattered residue. Cough x 1 after allowed to take large consecutive sips water but smaller sips appeared safe. She stated she wanted double porions however minimal amount eaten thus far. After she began to fall asleep therapist removed tray and she stated her frustration stating she could stay awake however therapist encouraged her to take a nap. ST will continue to follow. She may need instrumental assessment of swallow if continues to demonstrate question of laryngeal integrity and closure.  ?  ?HPI HPI: Patient is a 61 y.o. female with PMH: HTN, DM, CVA with right hemiparesis, COPD s/p pancreatectomy/splenectomy, diverticulitis with abscess leading to colostomy (03/10/21), anxiety, depression, HLD, discoid lupus, recently hospitalized 2/22-3/28 for new onset seizures with sepsis due to aspiration PNA and DKA and was also COVID+. She had discharged 3/28 to [facility] but returned to hospital 4/3 with AMS. In ED she presented with gurgling, hypoxia, multifocal PNA on CXR. ?  ?   ?SLP Plan ? Continue with current plan of care ? ?  ?  ?Recommendations for follow up therapy are one component of a multi-disciplinary discharge planning process, led by the attending physician.  Recommendations may be updated based on patient status, additional functional criteria and insurance authorization. ?  ? ?Recommendations  ?Diet recommendations: Dysphagia 2 (fine chop);Thin liquid ?Liquids provided via: Cup;Straw ?Medication Administration: Whole meds with  puree ?Compensations: Minimize environmental distractions;Slow rate;Small sips/bites ?Postural Changes and/or Swallow Maneuvers: Seated upright 90 degrees  ?   ?    ?   ? ? ? ? Oral Care Recommendations: Oral care BID ?Follow Up Recommendations: Skilled nursing-short term rehab (<3 hours/day) ?Assistance recommended at discharge: Frequent or constant Supervision/Assistance ?SLP Visit Diagnosis: Dysphagia, unspecified (R13.10) ?Plan: Continue with current plan of care ? ? ? ? ?  ?  ? ? ?Houston Siren ? ?05/17/2021, 11:23 AM ?

## 2021-05-17 NOTE — Consult Note (Signed)
?Consultation Note ?Date: 05/17/2021  ? ?Patient Name: Brittany [name]  ?DOB: 10/25/1960  MRN: [id number]  Age / Sex: 61 y.o., female  ?PCP: [facility] ?Referring Physician: Flora Lipps, MD ? ?Reason for Consultation: Establishing goals of care ? ?HPI/Patient Profile: 61 y.o. female  with past medical history of Hypertension, type 2 diabetes, CVA with right-sided hemiparesis, COPD, history of diverticulitis with abscess s/p colostomy, anxiety/depression, discoid lupus, HLD, and s/p pancreatectomy/splenectomy admitted on 05/15/2021 with AMS and hypoxia.  Diagnosed with multifocal pneumonia and sepsis.  Patient with frequent hospital admissions.  PMT consulted to discuss goals of care. ? ?Clinical Assessment and Goals of Care: ?I have reviewed medical records including EPIC notes, labs and imaging, received report from RN, assessed the patient and then with patient's daughter, [name], to discuss diagnosis prognosis, [place], EOL wishes, disposition and options. ? ?I first attempted to speak with patient however she was drowsy and slightly confused.  She could tell me her name but did not know if she was in the hospital or why she was in the hospital.  Obviously unable to participate in goals of care conversations.  RN reports patient removed IV this morning and has been declining some parts of her care including not leaving oxygen in place. ? ?While speaking with daughter, I introduced Palliative Medicine as specialized medical care for people living with serious illness. It focuses on providing relief from the symptoms and stress of a serious illness. The goal is to improve quality of life for both the patient and the family. ? ?Daughter tells me earlier this year patient was living with her until she had her seizure.  She tells me hospitalization for the seizure was about a month ago and after that patient was discharged to rehab.  Daughter tells me since seizure  patient has been confused.  Daughter tells me at baseline patient is slow to respond and slow in her movements but typically not confused.  Daughter also tells me that difficulty with nutrition is typical for her and not new-tells me she has bizarre eating habits.  Daughter tells me prior to seizure patient lived at home with her and daughter was primary caregiver. ? ? We discussed patient's current illness and what it means in the larger context of patient's on-going co-morbidities.  We discussed concern about patient's pneumonia, concerns about aspiration and dysphagia.  We discussed recurrent hospitalizations. ? ?I attempted to elicit values and goals of care important to the patient.  Daughter tells me this is not something they have discussed much but she does feel that patient is open to any interventions offered to prolong life at this point.  She confirms full CODE STATUS. ? ?Discussed with daughter the importance of continued conversation with family and the medical providers regarding overall plan of care and treatment options, ensuring decisions are within the context of the patient?s values and GOCs.   ? ?Daughter shares she would like patient to return home with home health following this hospitalization and would like to avoid patient returning to SNF. ? ?Questions and concerns were addressed. The family was encouraged to call with questions or concerns.   ? ?Primary Decision Maker ?NEXT OF KIN-daughter [name] at this point as patient is unable to make decisions for herself due to mental status ?  ? ?SUMMARY OF RECOMMENDATIONS   ?-Full code/full scope ?-Daughter hopeful for patient to come home with home health and avoid SNF ? ?Code Status/Advance Care Planning: ?Full code ? ?  ? ?  Primary Diagnoses: ?Present on Admission: ? Acute metabolic encephalopathy ? Multifocal pneumonia ? Restless leg syndrome ? Protein-calorie malnutrition, severe ? Pressure injury of skin ? Intraductal papillary mucinous  neoplasm of pancreas ? HLD (hyperlipidemia) ? Essential hypertension ? COPD, mild ([place]) ? Sepsis ([place]) ? Acute respiratory failure ([place]) ? ? ?I have reviewed the medical record, interviewed the patient and family, and examined the patient. The following aspects are pertinent. ? ?Past Medical History:  ?Diagnosis Date  ? Colostomy in place [facility])   ? h/o diverticulitis with abscess  ? Diabetes mellitus without complication ([place])   ? Dyslipidemia   ? Hypertension   ? Lupus ([place])   ? Pancreatic cancer ([place])   ? RLS (restless legs syndrome)   ? Seizure disorder ([place])   ? 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 ?Social History  ? ?Socioeconomic History  ? Marital status: Widowed  ?  Spouse name: Not on file  ? Number of children: Not on file  ? Years of education: Not on file  ? Highest education level: Not on file  ?Occupational History  ? Not on file  ?Tobacco Use  ? Smoking status: Former  ?  Types: Cigarettes  ?  Passive exposure: Past  ? Smokeless tobacco: Never  ?Substance and Sexual Activity  ? Alcohol use: Yes  ? Drug use: Not Currently  ? Sexual activity: Not on file  ?Other Topics Concern  ? Not on file  ?Social History Narrative  ? Not on file  ? ?Social Determinants of Health  ? ?Financial Resource Strain: Not on file  ?Food Insecurity: Not on file  ?Transportation Needs: Not on file  ?Physical Activity: Not on file  ?Stress: Not on file  ?Social Connections: Not on file  ? ?Family History  ?Problem Relation Age of Onset  ? Anxiety disorder Sister   ? Breast cancer Maternal Aunt   ? ?Scheduled Meds: ? atorvastatin  40 mg Oral QHS  ? docusate sodium  100 mg Oral BID  ? enoxaparin (LOVENOX) injection  20 mg Subcutaneous Q24H  ? feeding supplement (GLUCERNA SHAKE)  237 mL Oral TID BM  ? gabapentin  200 mg Oral TID  ? insulin aspart  0-5 Units Subcutaneous QHS  ? insulin aspart  0-9 Units Subcutaneous TID WC  ? lipase/protease/amylase  72,000 Units Oral TID WC  ? LORazepam  0.5 mg  Intravenous Q12H  ? melatonin  6 mg Oral QHS  ? multivitamin with minerals  1 tablet Oral Daily  ? OLANZapine  7.5 mg Oral QHS  ? polyvinyl alcohol  1 drop Both Eyes TID  ? rOPINIRole  1 mg Oral BID  ? sodium chloride flush  3 mL Intravenous Q12H  ? ?Continuous Infusions: ? sodium chloride 75 mL/hr at 05/17/21 0237  ? magnesium sulfate bolus IVPB 2 g (05/17/21 1023)  ? piperacillin-tazobactam (ZOSYN)  IV 3.375 g (05/17/21 0533)  ? potassium chloride 10 mEq (05/17/21 1016)  ? valproate sodium 400 mg (05/17/21 0532)  ? ?PRN Meds:.acetaminophen **OR** acetaminophen, albuterol, bisacodyl, guaiFENesin, hydrALAZINE, morphine injection, ondansetron **OR** ondansetron (ZOFRAN) IV, oxyCODONE, polyethylene glycol ?Allergies  ?Allergen Reactions  ? Cephalosporins Itching  ?  TOLERATED ZOSYN (PIPERACILLIN) BEFORE  ? Hydromorphone Itching  ? Keflin [Cephalothin] Itching  ? Lactose Intolerance (Gi) Diarrhea  ? ?Review of Systems  ?Unable to perform ROS: Mental status change  ? ?Physical Exam ?Constitutional:   ?   General: She is not in acute distress. ?  Comments: Drowsy  ?Pulmonary:  ?   Effort: Pulmonary effort is normal.  ?Skin: ?   General: Skin is warm and dry.  ?Neurological:  ?   Mental Status: She is disoriented.  ? ? ?Vital Signs: BP (!) 144/96 (BP Location: Left Arm)   Pulse 70   Temp (!) 97.4 ?F (36.3 ?C) (Oral)   Resp 14   Ht 5' (1.524 m)   Wt 44.1 kg   SpO2 100%   BMI 18.99 kg/m?  ?Pain Scale: 0-10 ?  ?Pain Score: 0-No pain ? ? ?SpO2: SpO2: 100 % ?O2 Device:SpO2: 100 % ?O2 Flow Rate: .O2 Flow Rate (L/min): 4 L/min ? ?IO: Intake/output summary:  ?Intake/Output Summary (Last 24 hours) at 05/17/2021 1055 ?Last data filed at 05/17/2021 0737 ?Gross per 24 hour  ?Intake 1722.82 ml  ?Output 300 ml  ?Net 1422.82 ml  ? ? ?LBM: Last BM Date : 05/17/21 (via ostomy) ?Baseline Weight: Weight: 44.1 kg ?Most recent weight: Weight: 44.1 kg     ?Palliative Assessment/Data: PPS 40% ? ? ? ? ?*Please note that this is a verbal  dictation therefore any spelling or grammatical errors are due to the "[employer]" system interpretation. ? ?Juel Burrow, DNP, AGNP-C ?Palliative Medicine Team ?[phone] ?Pager: [phone] ? ?

## 2021-05-17 NOTE — Progress Notes (Addendum)
?PROGRESS NOTE ? ? ? ?Brittany [name]  BSW:[id number] DOB: 09/01/1960 DOA: 05/15/2021 ?PCP: [facility]  ? ? ?Brief Narrative:  ?Brittany [name] is a 61 year old female with past medical history of essential hypertension, type 2 diabetes, CVA with right-sided hemiparesis, COPD, history of diverticulitis status post abscess and colostomy, anxiety and depression, discoid lupus, hyperlipidemia, s/p pancreatectomy/splenectomy, presented to hospital after she was found to be altered at the skilled nursing facility.  Patient was noted to be little hypoxic at the nursing home and had difficulty holding utensils.  Patient was then sent to the hospital for further evaluation.  Of note, patient does have history of recent prolonged hospitalization 2/22 - 3/28 for DKA, seizure disorder, COVID-19 viral infection and sepsis due to aspiration pneumonia, and behavioral disturbances.  ED patient was febrile with a temperature of 100.3 ?F, tachycardic, tachypneic and was hypoxic with oxygen saturation 90% on 7 L of high flow nasal cannula.  Initial labs showed elevated lactate with WBC at 41.5.  Procalcitonin elevated at 7.1.  CT head scan was reviewed which showed chronic right frontal left parietal occipital and cerebellar infarct.  Chest x-ray with pulmonary infiltrates schertz to multifocal pneumonia with possible right small pleural effusion.  Patient was started on IV antibiotics and was considered for admission to the hospital for acute metabolic encephalopathy secondary to sepsis/multifocal pneumonia, high suspicion for aspiration.  ? ? Assessment and Plan: ?* Multifocal pneumonia ?Presenting with altered mental status with fever leukocytosis and chest x-ray findings of multifocal pneumonia.  Was initially requiring 7 L of oxygen currently on 3 L.  Had acute organ dysfunction with lactic acidosis at 3.1.  MRSA PCR negative.  Continue Zosyn.  We will continue to wean oxygen as able.  Continue aspiration precautions.   Patient appears to be more somnolent today.  Continue nebulizers. ? ?Acute metabolic encephalopathy ?Patient with recent admission for seizures and DKA now presenting with multifocal pneumonia.  Somnolent today.  Continue antibiotic and aspiration precautions.  Delirium precautions.  Gentle IV fluids and ampicillin at this time. ? ?Sepsis ([place]) ?Secondary to multifocal pneumonia.  Leukocytosis trending down.  Continue IV Zosyn for now.  This is negative in 2 days.  Urine culture negative so far. ? ?Acute respiratory failure ([place]) ?Continue to multifocal pneumonia possibly aspiration on Zosyn.  On supplemental oxygen.  We will continue to wean as able.  Continue nebulizers. ? ?Seizure disorder ([place]) ?Was diagnosed on previous hospitalization.  Was on Depakote but level was low.  At home patient takes Depakote and Klonopin.  We will continue Depakote IV Ativan IV and olanzapine while in the hospital  ? ?Hypomagnesemia.  We will replace with magnesium sulfate IV 2 g.  Check levels in a.m. ? ?Hypokalemia.  We will replenish through IV KCl.  Check levels in a.m. we will change IV fluids with potassium. ? ?Essential hypertension ?Not on antihypertensives at home.  Hydralazine as needed for now. ? ?Restless leg syndrome ?Continue Requip, gabapentin. ? ?History of stroke with right-sided hemiparesis. ?Not on aspirin or Plavix as outpatient.  On Lipitor. ? ?Status post colostomy ([place]) ?Hx diverticulitis with abscess s/p colostomy ?-Continue colostomy care. ? ?COPD, mild ([place]) ?Continue bronchodilators. ? ?Protein-calorie malnutrition, severe ?Present on admission.  On dysphagia 2 diet at this time.  Aspiration precautions.  On Glucerna supplement  ? ?Skin excoriation.  No pressure ulcers.  Seen by wound care during hospitalization. ?  ?Goals of care, counseling/discussion ?Palliative care on board.  Full code at this  time. ? ?Dysphagia ?Patient with history of CVA with residual hemiparesis.  Speech therapy recommended  dysphagia 2 diet.  High concern for aspiration.   ? ?History of COVID-19 ?Positive on 2/22, completed isolation. ? ?Diabetes mellitus type 2 in nonobese [facility]) ?She was recently hospitalized for DKA.  Poor oral intake at this time.  On dysphagia 2 diet.  Only on sliding scale insulin at this time.  Was on  Semglee 20 units twice daily, insulin sliding scale at home.  Latest hemoglobin A1c 15 ? ?Hyperlipidemia ?Continue Lipitor. ? 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 Continue pancreatic supplements  ? ? ?DVT prophylaxis:   Lovenox subcu ? ?  Code Status: Full Code ? ?Family Communication:  ?No family at bedside.  Tried to reach [place] patient's daughter on the phone but was unable to leave a voice message as well. ? ?Disposition Plan:  ?Level of care: Progressive ? ?Status is: Inpatient ? ?Remains inpatient appropriate because: On supplemental oxygen, IV antibiotic, palliative care follow-up, reevaluation by PT/OT.  ?  ?Consultants:  ?PCCM  ?Palliative care ? ?Procedures:  ?None ? ?Antimicrobials:  ?Vancomycin 4/3 - 4/4 ?Zosyn 4/3>> ? ? ?Subjective: ?Today, patient was seen and examined at bedside.  Appears somnolent.  Denies pain nausea vomiting. ? ?Objective: ?Vitals:  ? 05/16/21 2350 05/17/21 0321 05/17/21 0813 05/17/21 1149  ?BP: 99/71 123/78 (!) 144/96 123/81  ?Pulse: 92 85 70 75  ?Resp: '16 16 14 18  '$ ?Temp: 98.4 ?F (36.9 ?C) 97.6 ?F (36.4 ?C) (!) 97.4 ?F (36.3 ?C) (!) 97.5 ?F (36.4 ?C)  ?TempSrc: Oral Oral Oral Oral  ?SpO2: 97% 90% 100% 99%  ?Weight:      ?Height:      ? ? ?Intake/Output Summary (Last 24 hours) at 05/17/2021 1251 ?Last data filed at 05/17/2021 0737 ?Gross per 24 hour  ?Intake 1722.82 ml  ?Output 300 ml  ?Net 1422.82 ml  ? ? ?Filed Weights  ? 05/15/21 1124  ?Weight: 44.1 kg  ? ? ?Physical Exam: ?General:  Average built, not in obvious distress, somnolent today.  Appears chronically ill, thinly built, older than stated age. ?HENT:   No scleral pallor  or icterus noted. Oral mucosa is moist.  Nasal cannula oxygen ?Chest:  Diminished breath sounds bilaterally.  ?CVS: S1 &S2 heard. No murmur.  Regular rate and rhythm. ?Abdomen: Soft, nontender, nondistended.  Bowel sounds are heard.  Colostomy with functioning bag. ?Extremities: No cyanosis, clubbing or edema.  Peripheral pulses are palpable. ?Psych: Somnolent, ?CNS: Somnolent, moves all the extremities. ?Skin: Warm and dry.  No rashes noted. ? ? ?Data Reviewed: I have personally reviewed the following labs and imaging studies.  ? ?CBC: ?Recent Labs  ?Lab 05/15/21 ?1323 05/15/21 ?1355 05/16/21 ?0254 05/17/21 ?0354  ?WBC 41.5*  --  37.1* 22.8*  ?NEUTROABS 34.4*  --   --   --   ?HGB 8.9* 9.2* 8.4* 8.4*  ?HCT 29.0* 27.0* 27.7* 25.8*  ?MCV 95.1  --  95.2 90.8  ?PLT 636*  --  553* 461*  ? ? ?Basic Metabolic Panel: ?Recent Labs  ?Lab 05/15/21 ?1323 05/15/21 ?1355 05/16/21 ?0254 05/17/21 ?0354  ?NA 145 147* 144 136  ?K 4.4 4.0 4.4 3.0*  ?CL 115*  --  110 102  ?CO2 21*  --  23 28  ?GLUCOSE 76  --  112* 120*  ?BUN 14  --  14 11  ?CREATININE 0.81  --  0.71 0.76  ?CALCIUM 8.5*  --  8.8* 8.4*  ?  MG  --   --   --  1.4*  ?PHOS  --   --   --  4.3  ? ? ?GFR: ?Estimated Creatinine Clearance: 51.4 mL/min (by C-G formula based on SCr of 0.76 mg/dL). ?Liver Function Tests: ?Recent Labs  ?Lab 05/15/21 ?1323  ?AST 27  ?ALT 14  ?ALKPHOS 73  ?BILITOT 0.5  ?PROT 7.4  ?ALBUMIN 2.0*  ? ? ?No results for input(s): LIPASE, AMYLASE in the last 168 hours. ?No results for input(s): AMMONIA in the last 168 hours. ?Coagulation Profile: ?Recent Labs  ?Lab 05/15/21 ?1323  ?INR 1.3*  ? ? ?Cardiac Enzymes: ?No results for input(s): CKTOTAL, CKMB, CKMBINDEX, TROPONINI in the last 168 hours. ?BNP (last 3 results) ?No results for input(s): PROBNP in the last 8760 hours. ?HbA1C: ?No results for input(s): HGBA1C in the last 72 hours. ?CBG: ?Recent Labs  ?Lab 05/16/21 ?1204 05/16/21 ?1731 05/16/21 ?2116 05/17/21 ?3546 05/17/21 ?1200  ?GLUCAP 178* 141* 98 145*  211*  ? ? ?Lipid Profile: ?No results for input(s): CHOL, HDL, LDLCALC, TRIG, CHOLHDL, LDLDIRECT in the last 72 hours. ?Thyroid Function Tests: ?No results for input(s): TSH, T4TOTAL, FREET4, T3FREE, THYROIDA

## 2021-05-17 NOTE — Evaluation (Signed)
Physical Therapy Evaluation ?Patient Details ?Name: Brittany [name] ?MRN: [id number] ?DOB: 1960/12/07 ?Today's Date: 05/17/2021 ? ?History of Present Illness ? Pt. is a 61 y.o. F presenting to [facility] on 4/3 for AMS from SNF secondary to PNA and sepsis. She was recently d/c on 3/28 for new-onset of seizure with sepsis due to aspiration PNA and DKA, COVID+. 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  ?Clinical Impression ? Pt presents with decreased bed mobility, sitting balance, transfer ability, strength, functional endurance and standing balance. These impairments are limiting her ability to safely and independently transfer, get into her home, perform all adls/iadls, and ambulate in the community. Pt to benefit from acute PT to address deficits. Bed mobility, sitting balance, and ADL practice performed.  Pt. Responded well but was limited secondary to energy levels, functional strength, balance, and endurance. SPT recommends SNF follow up for further functional mobility, balance, strength, and gait training once medically stable for d/c. PT to progress mobility as tolerated, and will continue to follow acutely.  ?   ?   ? ?Recommendations for follow up therapy are one component of a multi-disciplinary discharge planning process, led by the attending physician.  Recommendations may be updated based on patient status, additional functional criteria and insurance authorization. ? ?Follow Up Recommendations Skilled nursing-short term rehab (<3 hours/day) ? ?  ?Assistance Recommended at Discharge Frequent or constant Supervision/Assistance  ?Patient can return home with the following ? Assistance with cooking/housework;Direct supervision/assist for medications management;Help with stairs or ramp for entrance;Assist for transportation;A lot of help with walking and/or  transfers;A lot of help with bathing/dressing/bathroom ? ?  ?Equipment Recommendations None recommended by PT  ?Recommendations for Other Services ?    ?  ?Functional Status Assessment Patient has had a recent decline in their functional status and demonstrates the ability to make significant improvements in function in a reasonable and predictable amount of time.  ? ?  ?Precautions / Restrictions Precautions ?Precautions: Fall ?Precaution Comments: seizure, urinary incontinence, colostomy bag frequently comes off ?Restrictions ?Weight Bearing Restrictions: No  ? ?  ? ?Mobility ? Bed Mobility ?Overal bed mobility: Needs Assistance ?Bed Mobility: Supine to Sit, Sit to Supine ?  ?  ?Supine to sit: Mod assist ?Sit to supine: Min assist ?  ?General bed mobility comments: Mod A for sequencing cues, some LE management, and truncal support during rise, Pt able to bring her legs back in the bed and requires A to reposition trunk when returning to supine. Max A for scoot up in bed. ?Patient Response: Flat affect ? ?Transfers ?  ?  ?  ?  ?  ?  ?  ?  ?  ?  ?  ? ?Ambulation/Gait ?  ?  ?  ?  ?  ?  ?  ?  ? ?Stairs ?  ?  ?  ?  ?  ? ?Wheelchair Mobility ?  ? ?Modified Rankin (Stroke Patients Only) ?  ? ?  ? ?Balance Overall balance assessment: Needs assistance ?Sitting-balance support: No upper extremity supported, Feet supported ?Sitting balance-Leahy Scale: Good ?Sitting balance - Comments: Pt. able to maintain sitting balance while eating ice cream EOB. Slight posterior lean but she was able to correct. ?Postural control: Posterior lean ?  ?  ?  ?  ?  ?  ?  ?  ?  ?  ?  ?  ?  ?  ?   ? ? ? ?  Pertinent Vitals/Pain Pain Assessment ?Pain Assessment: No/denies pain ?Faces Pain Scale: Hurts a little bit ?Pain Location: B hips ?Pain Descriptors / Indicators: Aching, Discomfort ?Pain Intervention(s): Limited activity within patient's tolerance, Monitored during session  ? ? ?Home Living Family/patient expects to be discharged to:: Private  residence ?Living Arrangements: Children;Other relatives ?Available Help at Discharge: Family;Available PRN/intermittently ?Type of Home: House ?Home Access: Stairs to enter ?Entrance Stairs-Rails: Right;Left ?Entrance Stairs-Number of Steps: 2 ?  ?Home Layout: One level ?Home Equipment: Kasandra Knudsen - single point;Shower seat;Grab bars - tub/shower;Hand held shower head ?Additional Comments: Plan to go to short term SNF and then back to her home  ?  ?Prior Function Prior Level of Function : Independent/Modified Independent;History of Falls (last six months) ?  ?  ?  ?  ?  ?  ?Mobility Comments: uses RW for all mobility ?ADLs Comments: reports indep ?  ? ? ?Hand Dominance  ? Dominant Hand: Right ? ?  ?Extremity/Trunk Assessment  ? Upper Extremity Assessment ?Upper Extremity Assessment: Defer to OT evaluation ?  ? ?Lower Extremity Assessment ?Lower Extremity Assessment: Generalized weakness ?RLE Deficits / Details: Able to move against gravity, grossly 3/5 B ?  ? ?Cervical / Trunk Assessment ?Cervical / Trunk Assessment: Kyphotic  ?Communication  ? Communication: Expressive difficulties  ?Cognition Arousal/Alertness: Awake/alert ?Behavior During Therapy: Flat affect ?Overall Cognitive Status: Impaired/Different from baseline ?Area of Impairment: Following commands, Safety/judgement, Awareness, Problem solving ?  ?  ?  ?  ?  ?  ?  ?  ?  ?Current Attention Level: Selective ?Memory: Decreased short-term memory, Decreased recall of precautions ?Following Commands: Follows one step commands with increased time, Follows multi-step commands inconsistently ?Safety/Judgement: Decreased awareness of safety, Decreased awareness of deficits ?Awareness: Emergent ?Problem Solving: Requires verbal cues, Requires tactile cues, Slow processing ?General Comments: Slow processing, benefits from multimodal cuing ?  ?  ? ?  ?General Comments General comments (skin integrity, edema, etc.): Pt. was drowsy upon arrival. She gets emotional about her  current situation and is worried that she might die. Therapist provided encouragment and comfort. Pt encouraged to start small by simply sitting EOB today. Her arrousal improved while sitting EOB eating ice cream. ? ?  ?Exercises    ? ?Assessment/Plan  ?  ?PT Assessment Patient needs continued PT services  ?PT Problem List Decreased strength;Decreased balance;Decreased activity tolerance;Decreased mobility;Decreased cognition;Decreased coordination;Decreased knowledge of use of DME;Decreased safety awareness;Decreased knowledge of precautions ? ?   ?  ?PT Treatment Interventions DME instruction;Gait training;Functional mobility training;Stair training;Therapeutic activities;Therapeutic exercise;Balance training;Neuromuscular re-education;Patient/family education   ? ?PT Goals (Current goals can be found in the Care Plan section)  ?Acute Rehab PT Goals ?Patient Stated Goal: To improve her strength and return home. ?PT Goal Formulation: With patient ?Time For Goal Achievement: 05/31/21 ?Potential to Achieve Goals: Fair ? ?  ?Frequency Min 2X/week ?  ? ? ?Co-evaluation   ?  ?  ?  ?  ? ? ?  ?AM-PAC PT "6 Clicks" Mobility  ?Outcome Measure Help needed turning from your back to your side while in a flat bed without using bedrails?: A Little ?Help needed moving from lying on your back to sitting on the side of a flat bed without using bedrails?: A Little ?Help needed moving to and from a bed to a chair (including a wheelchair)?: A Little ?Help needed standing up from a chair using your arms (e.g., wheelchair or bedside chair)?: A Lot ?Help needed to walk in hospital room?: A Lot ?Help needed climbing  3-5 steps with a railing? : Total ?6 Click Score: 14 ? ?  ?End of Session   ?Activity Tolerance: Patient tolerated treatment well ?Patient left: in bed;with bed alarm set;with call bell/phone within reach ?Nurse Communication: Mobility status ?PT Visit Diagnosis: Other abnormalities of gait and mobility (R26.89);Muscle  weakness (generalized) (M62.81);Other symptoms and signs involving the nervous system (R29.898) ?  ? ?Time: 8466-5993 ?PT Time Calculation (min) (ACUTE ONLY): 22 min ? ? ?Charges:   PT Evaluation ?$PT Eval Low Compl

## 2021-05-17 NOTE — Evaluation (Signed)
Occupational Therapy Evaluation ?Patient Details ?Name: Brittany [name] ?MRN: [id number] ?DOB: 05-25-1960 ?Today's Date: 05/17/2021 ? ? ?History of Present Illness Pt. is a 61 y.o. F presenting to [facility] on 4/3 for AMS from SNF secondary to PNA and sepsis. She was recently d/c on 3/28 for new-onset of seizure with sepsis due to aspiration PNA and DKA, COVID+. 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  ? ?Clinical Impression ?  ?Pt admitted for concerns listed above. PTA pt was at a SNF receiving rehab prior to returning home, she reported that she was not able to work much with therapist before getting sick. At this time, pt is limited by weakness, balance deficits, decreased activity tolerance, and cognitive concerns. She is concerned about her ability to recover and regain her independence. She is requiring min-mod A for all ADL's and functional mobility, as well as relying on a RW. This session, pt was able to follow approximately 50% of simple commands due to difficulty attending to conversation/tasks. Recommending pt return to SNF to continue working on maximizing her independence. OT will follow acutely.   ?   ? ?Recommendations for follow up therapy are one component of a multi-disciplinary discharge planning process, led by the attending physician.  Recommendations may be updated based on patient status, additional functional criteria and insurance authorization.  ? ?Follow Up Recommendations ? Skilled nursing-short term rehab (<3 hours/day)  ?  ?Assistance Recommended at Discharge Frequent or constant Supervision/Assistance  ?Patient can return home with the following A lot of help with walking and/or transfers;A lot of help with bathing/dressing/bathroom;Assistance with cooking/housework;Direct supervision/assist for medications management;Direct  supervision/assist for financial management;Assist for transportation;Help with stairs or ramp for entrance ? ?  ?Functional Status Assessment ? Patient has had a recent decline in their functional status and demonstrates the ability to make significant improvements in function in a reasonable and predictable amount of time.  ?Equipment Recommendations ? None recommended by OT  ?  ?Recommendations for Other Services   ? ? ?  ?Precautions / Restrictions Precautions ?Precautions: Fall ?Precaution Comments: seizure, urinary incontinence, colostomy bag frequently comes off ?Restrictions ?Weight Bearing Restrictions: No  ? ?  ? ?Mobility Bed Mobility ?Overal bed mobility: Needs Assistance ?Bed Mobility: Supine to Sit, Sit to Supine ?  ?  ?Supine to sit: Mod assist ?Sit to supine: Min assist ?  ?General bed mobility comments: Mod A for sequencing cues, some LE management, and truncal support during rise, Pt able to bring her legs back in the bed and requires A to reposition trunk when returning to supine. Max A for scoot up in bed. ?  ? ?Transfers ?  ?  ?  ?  ?  ?  ?  ?  ?  ?General transfer comment: Pt refused due to fatigue ?  ? ?  ?Balance Overall balance assessment: Needs assistance ?Sitting-balance support: No upper extremity supported, Feet supported ?Sitting balance-Leahy Scale: Good ?  ?  ?  ?  ?  ?  ?  ?  ?  ?  ?  ?  ?  ?  ?  ?  ?   ? ?ADL either performed or assessed with clinical judgement  ? ?ADL Overall ADL's : Needs assistance/impaired ?Eating/Feeding: Set up;Sitting ?  ?Grooming: Set up;Sitting ?  ?Upper Body Bathing: Minimal assistance;Sitting ?  ?Lower Body Bathing: Moderate assistance;Sitting/lateral leans;Sit to/from stand ?  ?Upper Body Dressing :  Minimal assistance;Sitting ?  ?Lower Body Dressing: Moderate assistance;Sitting/lateral leans ?  ?Toilet Transfer: Moderate assistance;Stand-pivot ?  ?Toileting- Clothing Manipulation and Hygiene: Moderate assistance;Sit to/from stand;Sitting/lateral lean ?   ?  ?  ?  ?General ADL Comments: Pt limited to sitting EOB this session due to fatigue and weakness  ? ? ? ?Vision Baseline Vision/History: 0 No visual deficits ?Ability to See in Adequate Light: 0 Adequate ?Patient Visual Report: No change from baseline ?Vision Assessment?: No apparent visual deficits  ?   ?Perception   ?  ?Praxis   ?  ? ?Pertinent Vitals/Pain Pain Assessment ?Pain Assessment: No/denies pain  ? ? ? ?Hand Dominance Right ?  ?Extremity/Trunk Assessment Upper Extremity Assessment ?Upper Extremity Assessment: Generalized weakness ?  ?Lower Extremity Assessment ?Lower Extremity Assessment: Defer to PT evaluation ?  ?Cervical / Trunk Assessment ?Cervical / Trunk Assessment: Kyphotic ?  ?Communication Communication ?Communication: Expressive difficulties ?  ?Cognition Arousal/Alertness: Lethargic ?Behavior During Therapy: Flat affect ?Overall Cognitive Status: Impaired/Different from baseline ?Area of Impairment: Following commands, Safety/judgement, Awareness, Problem solving ?  ?  ?  ?  ?  ?  ?  ?  ?  ?  ?  ?Following Commands: Follows one step commands with increased time, Follows multi-step commands inconsistently ?Safety/Judgement: Decreased awareness of safety, Decreased awareness of deficits ?Awareness: Emergent ?Problem Solving: Requires verbal cues, Requires tactile cues, Slow processing ?General Comments: Slow processing, benefits from multimodal cuing ?  ?  ?General Comments  VSS on 3L ? ?  ?Exercises   ?  ?Shoulder Instructions    ? ? ?Home Living Family/patient expects to be discharged to:: Skilled nursing facility ?  ?  ?  ?  ?  ?  ?  ?  ?  ?  ?  ?  ?  ?  ?  ?  ?Additional Comments: Pt came from SNF, plan is to return to finish rehab before returning home ?  ? ?  ?Prior Functioning/Environment Prior Level of Function : Independent/Modified Independent;History of Falls (last six months) ?  ?  ?  ?  ?  ?  ?Mobility Comments: uses RW for all mobility ?ADLs Comments: reports indep ?  ? ?  ?  ?OT  Problem List: Decreased strength;Decreased range of motion;Decreased activity tolerance;Impaired balance (sitting and/or standing);Decreased safety awareness;Decreased cognition;Decreased knowledge of precautions ?  ?   ?OT Treatment/Interventions: Self-care/ADL training;Therapeutic exercise;Therapeutic activities;Patient/family education;Balance training;DME and/or AE instruction  ?  ?OT Goals(Current goals can be found in the care plan section) Acute Rehab OT Goals ?Patient Stated Goal: To get better ?OT Goal Formulation: With patient ?Time For Goal Achievement: 05/31/21 ?Potential to Achieve Goals: Good ?ADL Goals ?Pt Will Perform Lower Body Bathing: with modified independence;sitting/lateral leans;sit to/from stand ?Pt Will Perform Lower Body Dressing: with modified independence;sitting/lateral leans;sit to/from stand ?Pt Will Transfer to Toilet: with modified independence;ambulating ?Pt Will Perform Toileting - Clothing Manipulation and hygiene: with modified independence;sitting/lateral leans;sit to/from stand ?Additional ADL Goal #1: Pt will independently follow 3+ step directional tasks as a precursor to safe ADL's  ?OT Frequency: Min 2X/week ?  ? ?Co-evaluation   ?  ?  ?  ?  ? ?  ?AM-PAC OT "6 Clicks" Daily Activity     ?Outcome Measure Help from another person eating meals?: A Little ?Help from another person taking care of personal grooming?: A Little ?Help from another person toileting, which includes using toliet, bedpan, or urinal?: A Lot ?Help from another person bathing (including washing, rinsing, drying)?: A Lot ?Help  from another person to put on and taking off regular upper body clothing?: A Little ?Help from another person to put on and taking off regular lower body clothing?: A Lot ?6 Click Score: 15 ?  ?End of Session Equipment Utilized During Treatment: Oxygen ?Nurse Communication: Mobility status ? ?Activity Tolerance: Patient tolerated treatment well ?Patient left: in bed;with call  bell/phone within reach;with bed alarm set ? ?OT Visit Diagnosis: Unsteadiness on feet (R26.81);Other abnormalities of gait and mobility (R26.89);Muscle weakness (generalized) (M62.81)  ?              ?Time: 0355-9741 ?OT Tim

## 2021-05-18 DIAGNOSIS — G9341 Metabolic encephalopathy: Secondary | ICD-10-CM | POA: Diagnosis not present

## 2021-05-18 DIAGNOSIS — J9601 Acute respiratory failure with hypoxia: Secondary | ICD-10-CM | POA: Diagnosis not present

## 2021-05-18 DIAGNOSIS — A419 Sepsis, unspecified organism: Secondary | ICD-10-CM | POA: Diagnosis not present

## 2021-05-18 DIAGNOSIS — J189 Pneumonia, unspecified organism: Secondary | ICD-10-CM | POA: Diagnosis not present

## 2021-05-18 LAB — BASIC METABOLIC PANEL
Anion gap: 8 (ref 5–15)
BUN: 12 mg/dL (ref 8–23)
CO2: 22 mmol/L (ref 22–32)
Calcium: 8.3 mg/dL — ABNORMAL LOW (ref 8.9–10.3)
Chloride: 105 mmol/L (ref 98–111)
Creatinine, Ser: 0.65 mg/dL (ref 0.44–1.00)
GFR, Estimated: >60 mL/min (ref >60)
Glucose, Bld: 216 mg/dL — ABNORMAL HIGH (ref 70–99)
Potassium: 4.8 mmol/L (ref 3.5–5.1)
Sodium: 135 mmol/L (ref 135–145)

## 2021-05-18 LAB — CBC
HCT: 29.7 % — ABNORMAL LOW (ref 36.0–46.0)
Hemoglobin: 9.2 g/dL — ABNORMAL LOW (ref 12.0–15.0)
MCH: 28.8 pg (ref 26.0–34.0)
MCHC: 31 g/dL (ref 30.0–36.0)
MCV: 93.1 fL (ref 80.0–100.0)
Platelets: 206 K/uL (ref 150–400)
RBC: 3.19 MIL/uL — ABNORMAL LOW (ref 3.87–5.11)
RDW: 17.1 % — ABNORMAL HIGH (ref 11.5–15.5)
WBC: 13.6 K/uL — ABNORMAL HIGH (ref 4.0–10.5)
nRBC: 0.3 % — ABNORMAL HIGH (ref 0.0–0.2)

## 2021-05-18 LAB — GLUCOSE, CAPILLARY
Glucose-Capillary: 236 mg/dL — ABNORMAL HIGH (ref 70–99)
Glucose-Capillary: 152 mg/dL — ABNORMAL HIGH (ref 70–99)
Glucose-Capillary: 186 mg/dL — ABNORMAL HIGH (ref 70–99)
Glucose-Capillary: 217 mg/dL — ABNORMAL HIGH (ref 70–99)

## 2021-05-18 MED ORDER — MAGNESIUM OXIDE -MG SUPPLEMENT 400 (240 MG) MG PO TABS
400.0000 mg | ORAL_TABLET | Freq: Two times a day (BID) | ORAL | Status: DC
  Administered 2021-05-18 – 2021-05-23 (×11): 400 mg via ORAL
  Filled 2021-05-18 (×10): qty 1

## 2021-05-18 MED ORDER — INSULIN GLARGINE-YFGN 100 UNIT/ML ~~LOC~~ SOLN
20.0000 [IU] | Freq: Every day | SUBCUTANEOUS | Status: DC
  Administered 2021-05-18: 20 [IU] via SUBCUTANEOUS
  Filled 2021-05-18 (×2): qty 0.2

## 2021-05-18 NOTE — Progress Notes (Addendum)
Speech Language Pathology Treatment: Dysphagia  ?Patient Details ?Name: Brittany [name] ?MRN: [id number] ?DOB: Jul 12, 1960 ?Today's Date: 05/18/2021 ?Time: 2119-4174 ?SLP Time Calculation (min) (ACUTE ONLY): 22 min ? ?Assessment / Plan / Recommendation ?Clinical Impression ? Pt was slightly more awake this afternoon. Per RN she is getting daily scheduled Ativan - does she need scheduled or can she go to PRN? Her lethargy is a contributing factor to remaining on Dys 2 texture d/t to lethargy resulting in prolonged mastication and bolus formation. SLP assisted with feeding due to her drowsiness today. Yesterday she was coughing with thin however today there were no indications of aspiration. She cleared oral cavity with liquid washes. Continue Dys 2 and can upgrade once she has the alertness/initiation to masticate effectively. Continue thin liquids.  ?  ?HPI HPI: Patient is a 61 y.o. female with PMH: HTN, DM, CVA with right hemiparesis, COPD s/p pancreatectomy/splenectomy, diverticulitis with abscess leading to colostomy (03/10/21), anxiety, depression, HLD, discoid lupus, recently hospitalized 2/22-3/28 for new onset seizures with sepsis due to aspiration PNA and DKA and was also COVID+. She had discharged 3/28 to [facility] but returned to hospital 4/3 with AMS. In ED she presented with gurgling, hypoxia, multifocal PNA on CXR. ?  ?   ?SLP Plan ? Continue with current plan of care ? ?  ?  ?Recommendations for follow up therapy are one component of a multi-disciplinary discharge planning process, led by the attending physician.  Recommendations may be updated based on patient status, additional functional criteria and insurance authorization. ?  ? ?Recommendations  ?Diet recommendations: Dysphagia 2 (fine chop);Thin liquid ?Liquids provided via: Cup;Straw ?Medication Administration: Whole meds with puree ?Supervision: Staff to assist with self feeding;Full supervision/cueing for compensatory strategies (due to  lethargy) ?Compensations: Slow rate;Small sips/bites ?Postural Changes and/or Swallow Maneuvers: Seated upright 90 degrees  ?   ?    ?   ? ? ? ? Oral Care Recommendations: Oral care BID ?Follow Up Recommendations:  (TBD) ?Assistance recommended at discharge: Frequent or constant Supervision/Assistance ?SLP Visit Diagnosis: Dysphagia, unspecified (R13.10) ?Plan: Continue with current plan of care ? ? ? ? ?  ?  ? ? ?Houston Siren ? ?05/18/2021, 2:31 PM ?

## 2021-05-18 NOTE — Care Management Important Message (Signed)
Important Message ? ?Patient Details  ?Name: Brittany [name] ?MRN: [id number] ?Date of Birth: 1960/02/27 ? ? ?Medicare Important Message Given:  Yes ? ? ? ? ?Mirai Greenwood ?05/18/2021, 1:28 PM ?

## 2021-05-18 NOTE — Progress Notes (Signed)
?PROGRESS NOTE ? ? ? ?Brittany [name]  [name] DOB: Mar 01, 1960 DOA: 05/15/2021 ?PCP: [facility]  ? ? ?Brief Narrative:  ? ?Brittany [name] is a 61 year old female with past medical history of essential hypertension, type 2 diabetes, CVA with right-sided hemiparesis, COPD, history of diverticulitis status post abscess and colostomy, anxiety and depression, discoid lupus, hyperlipidemia, s/p pancreatectomy/splenectomy, presented to hospital after she was found to be altered at the skilled nursing facility.  Patient was noted to be little hypoxic at the nursing home and had difficulty holding utensils.  Patient was then sent to the hospital for further evaluation.  Of note, patient does have history of recent prolonged hospitalization 2/22 - 3/28 for DKA, seizure disorder, COVID-19 viral infection and sepsis due to aspiration pneumonia, and behavioral disturbances.  ED patient was febrile with a temperature of 100.3 ?F, tachycardic, tachypneic and was hypoxic with oxygen saturation 90% on 7 L of high flow nasal cannula.  Initial labs showed elevated lactate with WBC at 41.5.  Procalcitonin elevated at 7.1.  CT [name] scan was reviewed which showed chronic right frontal left parietal occipital and cerebellar infarct.  Chest x-ray with pulmonary infiltrates schertz to multifocal pneumonia with possible right small pleural effusion.  Patient was started on IV antibiotics and was considered for admission to the hospital for acute metabolic encephalopathy secondary to sepsis/multifocal pneumonia, high suspicion for aspiration.  ? ? Assessment and Plan: ? ?Principal Problem: ?  Multifocal pneumonia ?Active Problems: ?  Acute metabolic encephalopathy ?  Sepsis ([place]) ?  Acute respiratory failure ([name]) ?  Essential hypertension ?  Seizure disorder ([name]) ?  Restless leg syndrome ?  History of stroke ?  Status post colostomy [name]) ?  COPD, mild ([place]) ?  Protein-calorie malnutrition, severe ?  Pressure injury of skin ?   Intraductal papillary mucinous neoplasm of pancreas ?  HLD (hyperlipidemia) ?  Diabetes mellitus type 2 in nonobese [name]) ?  History of COVID-19 ?  Dysphagia ?  Goals of care, counseling/discussion ?  ?* Multifocal pneumonia ?Presenting with altered mental status with fever leukocytosis and chest x-ray findings of multifocal pneumonia.  Was initially requiring 7 L of oxygen currently on room air.  Had acute organ dysfunction with lactic acidosis at 3.1.  MRSA PCR negative.  On  Zosyn.  Continue aspiration precautions. Continue nebulizers.  Procalcitonin at  5.1 yesterday. ? ?Acute metabolic encephalopathy ?Patient with recent admission for seizures and DKA now presenting with multifocal pneumonia. Continue antibiotic and aspiration precautions.  Delirium precautions.  Oriented to place.  Communicative. ? ?Sepsis ([name]) ?Secondary to multifocal pneumonia.  Leukocytosis trending down.  On IV Zosyn.  Urine culture negative.  Blood cultures negative in 3 days.  Significantly elevated procalcitonin.  Follow-up in AM. ? ?Acute respiratory failure ([name]) ?Continue to multifocal pneumonia possibly aspiration on Zosyn.  Currently has been weaned down on the oxygen.  We will continue to monitor. ? ?Seizure disorder ([name]) ?Was diagnosed on previous hospitalization.  Was on Depakote but level was low.  At home patient takes Depakote and Klonopin, continue Depakote IV Ativan IV and olanzapine while in the hospital until oral intake is adequate ? ?Hypomagnesemia.  Replenished with 2 g of magnesium sulfate yesterday.  Check magnesium levels in a.m. ? ?Hypokalemia.  Improved after replacement.  Latest potassium of 4.8. ? ?Essential hypertension ?Not on antihypertensives at home.  Hydralazine as needed for now. ? ?Restless leg syndrome ?Continue Requip, gabapentin. ? ?History of stroke with right-sided hemiparesis. ?Not on  aspirin or Plavix as outpatient.  On Lipitor.  Will likely need to start on aspirin on discharge ? ?Status post  colostomy [name]) ?Hx diverticulitis with abscess s/p colostomy ?-Continue colostomy care. ? ?COPD, mild ([name]) ?Continue bronchodilators. ? ?Protein-calorie malnutrition, severe ?Present on admission.  On dysphagia 2 diet at this time.  Aspiration precautions.  On Glucerna supplement.  Advance diet as tolerated. ? ?Skin excoriation.  No pressure ulcers.  Seen by wound care during hospitalization. ?  ?Dysphagia ?Patient with history of CVA with residual hemiparesis.  Speech therapy recommended dysphagia 2 diet.  High concern for aspiration.   ? ?History of COVID-19 ?Positive on 2/22, completed isolation. ? ?Diabetes mellitus type 2 in nonobese [name]) ?She was recently hospitalized for DKA.  Poor oral intake at this time.  On dysphagia 2 diet.  Only on sliding scale insulin at this time.  Was on  Semglee 20 units twice daily, insulin sliding scale at home.  Latest hemoglobin A1c 15.0.  We will start Semglee 20 at bedtime ? ?Hyperlipidemia ?Continue Lipitor. ? 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 Continue pancreatic supplements  ? ?Goals of care, counseling/discussion ?Palliative care on board.  Full code at this time. ? ?Deconditioning/debility. ?Patient has been seen by physical therapy/Occupational Therapy who recommended skilled nursing facility placement.  Patient is however wishing home on discharge. ? ?DVT prophylaxis:   Lovenox subcu ? ?  Code Status: Full Code ? ?Family Communication:  ?I spoke with the patient's daughter on phone and updated her about the clinical condition of the patient and spoke about the disposition plan. She wishes her to come home on discharge. ? ?Disposition Plan:  ?Level of care: Progressive ? ?Status is: Inpatient ? ?Remains inpatient appropriate because:  IV antibiotic, palliative care follow-up, possible placement, pending clinical improvement ? ?Consultants:  ?PCCM  ?Palliative care ? ?Procedures:  ?None ? ?Antimicrobials:   ? ?Zosyn 4/3>> ? ?Subjective: ?Today, patient was seen and examined at bedside.  Appears to be little more alert awake and communicative.  Oriented to place.  Denies overt pain nausea vomiting, denies overt shortness of breath. ? ?Objective: ?Vitals:  ? 05/17/21 1632 05/17/21 2001 05/18/21 0317 05/18/21 0817  ?BP: 113/73 118/82 109/69 (!) 142/92  ?Pulse: 82 80 93 79  ?Resp: '16 20 16 16  '$ ?Temp: 97.8 ?F (36.6 ?C) 98.4 ?F (36.9 ?C) 97.7 ?F (36.5 ?C) 97.8 ?F (36.6 ?C)  ?TempSrc: Oral Oral Axillary Oral  ?SpO2: 99% 91% 96% 95%  ?Weight:      ?Height:      ? ? ?Intake/Output Summary (Last 24 hours) at 05/18/2021 1033 ?Last data filed at 05/18/2021 0623 ?Gross per 24 hour  ?Intake 3680.86 ml  ?Output 2530 ml  ?Net 1150.86 ml  ? ? ?Filed Weights  ? 05/15/21 1124  ?Weight: 44.1 kg  ? ? ?Physical Exam: ?General: Thinly built, not in obvious distress, older than stated age, more alert awake and communicative. ?HENT:   No scleral pallor or icterus noted. Oral mucosa is moist.  ?Chest:   Diminished breath sounds bilaterally. ?CVS: S1 &S2 heard. No murmur.  Regular rate and rhythm. ?Abdomen: Soft, nontender, nondistended.  Bowel sounds are heard.  Colostomy functioning ?Extremities: No cyanosis, clubbing or edema.  Peripheral pulses are palpable. ?Psych: Alert, awake and oriented to place. ?CNS:  No cranial nerve deficits.  Moves all extremities. ?Skin: Warm and dry.  No rashes noted. ? ?Data Reviewed: I have personally reviewed the following labs and imaging studies.  ? ?  CBC: ?Recent Labs  ?Lab 05/15/21 ?1323 05/15/21 ?1355 05/16/21 ?0254 05/17/21 ?0354 05/18/21 ?0153  ?WBC 41.5*  --  37.1* 22.8* 13.6*  ?NEUTROABS 34.4*  --   --   --   --   ?HGB 8.9* 9.2* 8.4* 8.4* 9.2*  ?HCT 29.0* 27.0* 27.7* 25.8* 29.7*  ?MCV 95.1  --  95.2 90.8 93.1  ?PLT 636*  --  553* 461* 206  ? ? ?Basic Metabolic Panel: ?Recent Labs  ?Lab 05/15/21 ?1323 05/15/21 ?1355 05/16/21 ?0254 05/17/21 ?0354 05/18/21 ?0153  ?NA 145 147* 144 136 135  ?K 4.4 4.0 4.4 3.0*  4.8  ?CL 115*  --  110 102 105  ?CO2 21*  --  '23 28 22  '$ ?GLUCOSE 76  --  112* 120* 216*  ?BUN 14  --  '14 11 12  '$ ?CREATININE 0.81  --  0.71 0.76 0.65  ?CALCIUM 8.5*  --  8.8* 8.4* 8.3*  ?MG  --   --   --  1.

## 2021-05-19 DIAGNOSIS — J9601 Acute respiratory failure with hypoxia: Secondary | ICD-10-CM | POA: Diagnosis not present

## 2021-05-19 DIAGNOSIS — A419 Sepsis, unspecified organism: Secondary | ICD-10-CM | POA: Diagnosis not present

## 2021-05-19 DIAGNOSIS — J189 Pneumonia, unspecified organism: Secondary | ICD-10-CM | POA: Diagnosis not present

## 2021-05-19 DIAGNOSIS — G9341 Metabolic encephalopathy: Secondary | ICD-10-CM | POA: Diagnosis not present

## 2021-05-19 LAB — CBC
HCT: 29 % — ABNORMAL LOW (ref 36.0–46.0)
Hemoglobin: 9.1 g/dL — ABNORMAL LOW (ref 12.0–15.0)
MCH: 28.3 pg (ref 26.0–34.0)
MCHC: 31.4 g/dL (ref 30.0–36.0)
MCV: 90.3 fL (ref 80.0–100.0)
Platelets: 455 10*3/uL — ABNORMAL HIGH (ref 150–400)
RBC: 3.21 MIL/uL — ABNORMAL LOW (ref 3.87–5.11)
RDW: 16.2 % — ABNORMAL HIGH (ref 11.5–15.5)
WBC: 9.5 10*3/uL (ref 4.0–10.5)
nRBC: 0.6 % — ABNORMAL HIGH (ref 0.0–0.2)

## 2021-05-19 LAB — GLUCOSE, CAPILLARY
Glucose-Capillary: 292 mg/dL — ABNORMAL HIGH (ref 70–99)
Glucose-Capillary: 92 mg/dL (ref 70–99)
Glucose-Capillary: 99 mg/dL (ref 70–99)
Glucose-Capillary: 101 mg/dL — ABNORMAL HIGH (ref 70–99)
Glucose-Capillary: 106 mg/dL — ABNORMAL HIGH (ref 70–99)

## 2021-05-19 LAB — COMPREHENSIVE METABOLIC PANEL
ALT: 14 U/L (ref 0–44)
AST: 20 U/L (ref 15–41)
Albumin: 1.9 g/dL — ABNORMAL LOW (ref 3.5–5.0)
Alkaline Phosphatase: 71 U/L (ref 38–126)
Anion gap: 9 (ref 5–15)
BUN: 7 mg/dL — ABNORMAL LOW (ref 8–23)
CO2: 24 mmol/L (ref 22–32)
Calcium: 9.2 mg/dL (ref 8.9–10.3)
Chloride: 102 mmol/L (ref 98–111)
Creatinine, Ser: 0.61 mg/dL (ref 0.44–1.00)
GFR, Estimated: >60 mL/min (ref >60)
Glucose, Bld: 304 mg/dL — ABNORMAL HIGH (ref 70–99)
Potassium: 3.9 mmol/L (ref 3.5–5.1)
Sodium: 135 mmol/L (ref 135–145)
Total Bilirubin: 0.1 mg/dL — ABNORMAL LOW (ref 0.3–1.2)
Total Protein: 7.1 g/dL (ref 6.5–8.1)

## 2021-05-19 LAB — MAGNESIUM: Magnesium: 1.5 mg/dL — ABNORMAL LOW (ref 1.7–2.4)

## 2021-05-19 LAB — PROCALCITONIN: Procalcitonin: 1.31 ng/mL

## 2021-05-19 LAB — PHOSPHORUS: Phosphorus: 3 mg/dL (ref 2.5–4.6)

## 2021-05-19 MED ORDER — INSULIN GLARGINE-YFGN 100 UNIT/ML ~~LOC~~ SOLN
24.0000 [IU] | Freq: Every day | SUBCUTANEOUS | Status: DC
  Administered 2021-05-19 – 2021-05-22 (×4): 24 [IU] via SUBCUTANEOUS
  Filled 2021-05-19 (×5): qty 0.24

## 2021-05-19 MED ORDER — HALOPERIDOL LACTATE 5 MG/ML IJ SOLN
2.5000 mg | Freq: Four times a day (QID) | INTRAMUSCULAR | Status: DC | PRN
  Administered 2021-05-21: 2.5 mg via INTRAVENOUS
  Filled 2021-05-19 (×2): qty 1

## 2021-05-19 MED ORDER — HALOPERIDOL LACTATE 5 MG/ML IJ SOLN
2.5000 mg | Freq: Once | INTRAMUSCULAR | Status: AC
  Administered 2021-05-19: 2.5 mg via INTRAVENOUS
  Filled 2021-05-19: qty 1

## 2021-05-19 MED ORDER — MAGNESIUM SULFATE 2 GM/50ML IV SOLN
2.0000 g | Freq: Once | INTRAVENOUS | Status: AC
Start: 2021-05-19 — End: 2021-05-19
  Administered 2021-05-19: 2 g via INTRAVENOUS
  Filled 2021-05-19: qty 50

## 2021-05-19 MED ORDER — CLONAZEPAM 0.25 MG PO TBDP
0.2500 mg | ORAL_TABLET | Freq: Two times a day (BID) | ORAL | Status: DC
  Administered 2021-05-19 – 2021-05-23 (×8): 0.25 mg via ORAL
  Filled 2021-05-19 (×8): qty 1

## 2021-05-19 MED ORDER — ASPIRIN EC 81 MG PO TBEC
81.0000 mg | DELAYED_RELEASE_TABLET | Freq: Every day | ORAL | Status: DC
  Administered 2021-05-19 – 2021-05-23 (×5): 81 mg via ORAL
  Filled 2021-05-19 (×5): qty 1

## 2021-05-19 NOTE — Progress Notes (Signed)
?PROGRESS NOTE ? ? ? ?Brittany [name]  CMK:[id number] DOB: April 09, 1960 DOA: 05/15/2021 ?PCP: [facility]  ? ? ?Brief Narrative:  ? ?Brittany [name] is a 61 year old female with past medical history of essential hypertension, type 2 diabetes, CVA with right-sided hemiparesis, COPD, history of diverticulitis status post abscess and colostomy, anxiety and depression, discoid lupus, hyperlipidemia, s/p pancreatectomy/splenectomy, presented to hospital after she was found to be altered at the skilled nursing facility.  Patient was noted to be little hypoxic at the nursing home and had difficulty holding utensils.  Patient was then sent to the hospital for further evaluation.  Of note, patient does have history of recent prolonged hospitalization 2/22 - 3/28 for DKA, seizure disorder, COVID-19 viral infection and sepsis due to aspiration pneumonia, and behavioral disturbances.  ED patient was febrile with a temperature of 100.3 ?F, tachycardic, tachypneic and was hypoxic with oxygen saturation 90% on 7 L of high flow nasal cannula.  Initial labs showed elevated lactate with WBC at 41.5.  Procalcitonin elevated at 7.1.  CT head scan was reviewed which showed chronic right frontal left parietal occipital and cerebellar infarct.  Chest x-ray with pulmonary infiltrates schertz to multifocal pneumonia with possible right small pleural effusion.  Patient was started on IV antibiotics and was considered for admission to the hospital for acute metabolic encephalopathy secondary to sepsis/multifocal pneumonia, high suspicion for aspiration.  ? ? Assessment and Plan: ? ?Principal Problem: ?  Multifocal pneumonia ?Active Problems: ?  Acute metabolic encephalopathy ?  Sepsis ([place]) ?  Acute respiratory failure ([place]) ?  Essential hypertension ?  Seizure disorder ([place]) ?  Restless leg syndrome ?  History of stroke ?  Status post colostomy [facility]) ?  COPD, mild ([place]) ?  Protein-calorie malnutrition, severe ?  Pressure injury of skin ?   Intraductal papillary mucinous neoplasm of pancreas ?  HLD (hyperlipidemia) ?  Diabetes mellitus type 2 in nonobese [facility]) ?  History of COVID-19 ?  Dysphagia ?  Goals of care, counseling/discussion ?  ?* Multifocal pneumonia ?Presenting with altered mental status with fever leukocytosis and chest x-ray findings of multifocal pneumonia.  Was initially requiring 7 L of oxygen currently on room air.  Had acute organ dysfunction with lactic acidosis at 3.1.  MRSA PCR negative.  On  Zosyn.  Continue aspiration precautions. Continue nebulizers.  Procalcitonin was 5.1 on presentation which has trended down to 1.3. ? ?Acute metabolic encephalopathy ?Patient with recent admission for seizures and DKA now presenting with multifocal pneumonia. Continue antibiotic and aspiration precautions.  Delirium precautions. Overall mentation has improved at this time. ? ?Sepsis ([place]) ?Secondary to multifocal pneumonia.  Leukocytosis trending down.  On IV Zosyn.  Urine culture negative.  Blood cultures negative in 3 days.  Significantly elevated procalcitonin on presentation which has trended down to 1.3. ? ?Acute respiratory failure ([place]) ?Continue to multifocal pneumonia possibly aspiration on Zosyn.  Currently has been weaned down on the oxygen.  We will continue to monitor. ? ?Seizure disorder ([place]) ?Was diagnosed on previous hospitalization.  Was on Depakote but level was low.  At home patient takes Depakote and Klonopin.receiving Depakote IV Ativan IV and olanzapine while in the hospital until oral intake is adequate.  We will discontinue Ativan and put on Klonopin ? ?Hypomagnesemia.  We will continue to replenish orally. ? ?Hypokalemia.  Improved after replacement.  Latest potassium of 3.9. ? ?Essential hypertension ?Not on antihypertensives at home.  Hydralazine as needed for now. ? ?Restless leg syndrome ?Continue  Requip, gabapentin. ? ?History of stroke with right-sided hemiparesis. ?Not on aspirin or Plavix as outpatient.  On  Lipitor.  We will add a low-dose aspirin. ? ?Status post colostomy ([place]) ?Hx diverticulitis with abscess s/p colostomy ?-Continue colostomy care. ? ?COPD, mild ([place]) ?Continue bronchodilators. ? ?Protein-calorie malnutrition, severe ?Present on admission.  On dysphagia 2 diet at this time.  Aspiration precautions.  On Glucerna supplement.  Advance diet as tolerated. ? ?Skin excoriation.  No pressure ulcers.  Seen by wound care during hospitalization. ?  ?Dysphagia ?Patient with history of CVA with residual hemiparesis.  Speech therapy recommended dysphagia 2 diet.  High concern for aspiration.   ? ?History of COVID-19 ?Positive on 2/22, completed isolation. ? ?Diabetes mellitus type 2 in nonobese [facility]) ?She was recently hospitalized for DKA.  Poor oral intake at this time.  On dysphagia 2 diet.  Only on sliding scale insulin at this time.  Was on  Semglee 20 units twice daily, insulin sliding scale at home.  Latest hemoglobin A1c 15.0.  Started Semglee 20 units at bedtime since for 6/23.  His to 24 units at nighttime. ? ?Hyperlipidemia ?Continue Lipitor. ? 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 Continue pancreatic supplements  ? ?Goals of care, counseling/discussion ?Palliative care on board.  Full code at this time. ? ?Deconditioning/debility. ?Patient has been seen by physical therapy/Occupational Therapy who recommended skilled nursing facility placement.  Patient is however wishing home on discharge. ? ?DVT prophylaxis:   Lovenox subcu ? ?  Code Status: Full Code ? ?Family Communication:  ?I spoke with the patient's daughter on phone on 05/18/2021. ? ?Disposition Plan:  ?Home with home health/skilled nursing facility.  PT OT recommending skilled nursing facility.  Patient as well as patient's daughter wishes to go home on discharge ? ?Level of care: Progressive ? ?Status is: Inpatient ? ?Remains inpatient appropriate because:  IV antibiotic,  pending clinical  improvement ? ?Consultants:  ?PCCM  ?Palliative care ? ?Procedures:  ?None ? ?Antimicrobials:  ? ?Zosyn 4/3>> ? ?Subjective: ?Today, patient was seen and examined at bedside.  Appears to be mildly communicated.  States that she is hungry and wants to eat and wants to go home.  Denies pain, nausea, vomiting  ? ?Objective: ?Vitals:  ? 05/18/21 2050 05/19/21 9826 05/19/21 0324 05/19/21 4158  ?BP: (!) 158/93 (!) 149/96 (!) 152/90 (!) 168/93  ?Pulse: 74 64 67 67  ?Resp: '16 16 17 18  '$ ?Temp: 97.6 ?F (36.4 ?C) 98.4 ?F (36.9 ?C) 97.6 ?F (36.4 ?C) 98 ?F (36.7 ?C)  ?TempSrc: Oral Oral Oral Oral  ?SpO2: 99% 100% 96% 98%  ?Weight:      ?Height:      ? ? ?Intake/Output Summary (Last 24 hours) at 05/19/2021 1100 ?Last data filed at 05/19/2021 0030 ?Gross per 24 hour  ?Intake --  ?Output 600 ml  ?Net -600 ml  ? ?Filed Weights  ? 05/15/21 1124  ?Weight: 44.1 kg  ? ? ?Physical Exam: ?General: Thinly built, not in obvious distress older than stated age, alert awake and communicative ?HENT:   No scleral pallor or icterus noted. Oral mucosa is moist.  ?Chest:   Diminished breath sounds bilaterally. No crackles or wheezes.  ?CVS: S1 &S2 heard. No murmur.  Regular rate and rhythm. ?Abdomen: Soft, nontender, nondistended.  Bowel sounds are heard.  Colostomy bag in place. ?Extremities: No cyanosis, clubbing or edema.  Peripheral pulses are palpable. ?Psych: Alert, awake and oriented, to place, communicative ?CNS:  No cranial  nerve deficits.  Moves all extremities. ?Skin: Warm and dry.  No rashes noted. ? ? ?Data Reviewed: I have personally reviewed the following labs and imaging studies.  ? ?CBC: ?Recent Labs  ?Lab 05/15/21 ?1323 05/15/21 ?1355 05/16/21 ?0254 05/17/21 ?0354 05/18/21 ?0153 05/19/21 ?2174  ?WBC 41.5*  --  37.1* 22.8* 13.6* 9.5  ?NEUTROABS 34.4*  --   --   --   --   --   ?HGB 8.9* 9.2* 8.4* 8.4* 9.2* 9.1*  ?HCT 29.0* 27.0* 27.7* 25.8* 29.7* 29.0*  ?MCV 95.1  --  95.2 90.8 93.1 90.3  ?PLT 636*  --  553* 461* 206 455*  ? ?Basic  Metabolic Panel: ?Recent Labs  ?Lab 05/15/21 ?1323 05/15/21 ?1355 05/16/21 ?0254 05/17/21 ?0354 05/18/21 ?0153 05/19/21 ?7159  ?NA 145 147* 144 136 135 135  ?K 4.4 4.0 4.4 3.0* 4.8 3.9  ?CL 115*  --  110 102

## 2021-05-19 NOTE — Plan of Care (Signed)
  Problem: Safety: Goal: Ability to remain free from injury will improve Outcome: Progressing   
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
## 2021-05-19 NOTE — Progress Notes (Signed)
HOSPITAL MEDICINE OVERNIGHT EVENT NOTE   ? ?Notified by nursing that patient can continues to be confused and continues to regularly attend to get out of bed, placing herself at risk of injury due to fall. ? ?Unfortunately there is no sitter available.  Patient has been receiving as needed Haldol without effect. ? ?In an attempt to keep the patient safe in the least restrictive means possible will attempt a Posey vest and mittens.  Continuing to monitor patient closely. ? ?Vernelle Emerald  MD ?[facility]  ? ? ? ? ? ? ? ? ? ? ?
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
## 2021-05-19 NOTE — Progress Notes (Signed)
Patient is agitated, trying to get up in the bed and yelling saying "I want to go home with my mama". Re-orient patient with the time and place and surrounding for her safety. Offered food and water to keep her calm because she said she is hungry. Messaged MD via amion. Will keep monitor. ? ? ?

## 2021-05-19 NOTE — Progress Notes (Signed)
HOSPITAL MEDICINE OVERNIGHT EVENT NOTE   ? ?Notified by nursing that patient has become increasingly agitated throughout the evening. ? ?Patient is regularly attempting to get out of bed.  Patient is frequently attempting to pull at medical devices and impeding plan of care. ? ?Administering 2.5 mg of Haldol and will monitor for response. ? ?Vernelle Emerald  MD ?[facility]  ? ? ?ADDENDUM 3am 4/7 ? ?Patient, while improved, continuing to exhibit agitation and attempting to get a bed.  We will repeat an additional 2.5 mg of intravenous Haldol. ? ?Sherryll Burger Janah Mcculloh ? ? ? ? ? ? ? ? ? ? ? ?

## 2021-05-20 DIAGNOSIS — A419 Sepsis, unspecified organism: Secondary | ICD-10-CM | POA: Diagnosis not present

## 2021-05-20 DIAGNOSIS — G9341 Metabolic encephalopathy: Secondary | ICD-10-CM | POA: Diagnosis not present

## 2021-05-20 DIAGNOSIS — J189 Pneumonia, unspecified organism: Secondary | ICD-10-CM | POA: Diagnosis not present

## 2021-05-20 DIAGNOSIS — J9601 Acute respiratory failure with hypoxia: Secondary | ICD-10-CM | POA: Diagnosis not present

## 2021-05-20 LAB — BASIC METABOLIC PANEL
Anion gap: 9 (ref 5–15)
BUN: 5 mg/dL — ABNORMAL LOW (ref 8–23)
CO2: 30 mmol/L (ref 22–32)
Calcium: 8.9 mg/dL (ref 8.9–10.3)
Chloride: 101 mmol/L (ref 98–111)
Creatinine, Ser: 0.55 mg/dL (ref 0.44–1.00)
GFR, Estimated: >60 mL/min (ref >60)
Glucose, Bld: 178 mg/dL — ABNORMAL HIGH (ref 70–99)
Potassium: 4 mmol/L (ref 3.5–5.1)
Sodium: 140 mmol/L (ref 135–145)

## 2021-05-20 LAB — GLUCOSE, CAPILLARY
Glucose-Capillary: 155 mg/dL — ABNORMAL HIGH (ref 70–99)
Glucose-Capillary: 148 mg/dL — ABNORMAL HIGH (ref 70–99)
Glucose-Capillary: 186 mg/dL — ABNORMAL HIGH (ref 70–99)
Glucose-Capillary: 222 mg/dL — ABNORMAL HIGH (ref 70–99)
Glucose-Capillary: 263 mg/dL — ABNORMAL HIGH (ref 70–99)

## 2021-05-20 LAB — CBC
HCT: 30.1 % — ABNORMAL LOW (ref 36.0–46.0)
Hemoglobin: 9.6 g/dL — ABNORMAL LOW (ref 12.0–15.0)
MCH: 29.1 pg (ref 26.0–34.0)
MCHC: 31.9 g/dL (ref 30.0–36.0)
MCV: 91.2 fL (ref 80.0–100.0)
Platelets: 452 10*3/uL — ABNORMAL HIGH (ref 150–400)
RBC: 3.3 MIL/uL — ABNORMAL LOW (ref 3.87–5.11)
RDW: 16.3 % — ABNORMAL HIGH (ref 11.5–15.5)
WBC: 15.1 10*3/uL — ABNORMAL HIGH (ref 4.0–10.5)
nRBC: 0.1 % (ref 0.0–0.2)

## 2021-05-20 LAB — CULTURE, BLOOD (ROUTINE X 2)
Culture: NO GROWTH
Special Requests: ADEQUATE

## 2021-05-20 LAB — MAGNESIUM: Magnesium: 1.7 mg/dL (ref 1.7–2.4)

## 2021-05-20 NOTE — Progress Notes (Addendum)
?PROGRESS NOTE ? ? ? ?Brittany [name]  XFG:[id number] DOB: 22-Sep-1960 DOA: 05/15/2021 ?PCP: [facility]  ? ? ?Brief Narrative:  ? ?Brittany [name] is a 61 year old female with past medical history of essential hypertension, type 2 diabetes, CVA with right-sided hemiparesis, COPD, history of diverticulitis status post abscess and colostomy, anxiety and depression, discoid lupus, hyperlipidemia, s/p pancreatectomy/splenectomy, presented to hospital after she was found to be altered at the skilled nursing facility.  Patient was noted to be little hypoxic at the nursing home and had difficulty holding utensils.  Patient was then sent to the hospital for further evaluation.  Of note, patient does have history of recent prolonged hospitalization 2/22 - 3/28 for DKA, seizure disorder, COVID-19 viral infection and sepsis due to aspiration pneumonia, and behavioral disturbances.  ED patient was febrile with a temperature of 100.3 ?F, tachycardic, tachypneic and was hypoxic with oxygen saturation 90% on 7 L of high flow nasal cannula.  Initial labs showed elevated lactate with WBC at 41.5.  Procalcitonin elevated at 7.1.  CT head scan was reviewed which showed chronic right frontal left parietal occipital and cerebellar infarct.  Chest x-ray with pulmonary infiltrates suggestive of multifocal pneumonia with possible right small pleural effusion.  Patient was started on IV antibiotics and was considered for admission to the hospital for acute metabolic encephalopathy secondary to sepsis/multifocal pneumonia, high suspicion for aspiration.  ? ? Assessment and Plan: ? ?Principal Problem: ?  Multifocal pneumonia ?Active Problems: ?  Acute metabolic encephalopathy ?  Sepsis ([place]) ?  Acute respiratory failure ([place]) ?  Essential hypertension ?  Seizure disorder ([place]) ?  Restless leg syndrome ?  History of stroke ?  Status post colostomy [facility]) ?  COPD, mild ([place]) ?  Protein-calorie malnutrition, severe ?  Pressure injury of  skin ?  Intraductal papillary mucinous neoplasm of pancreas ?  HLD (hyperlipidemia) ?  Diabetes mellitus type 2 in nonobese [facility]) ?  History of COVID-19 ?  Dysphagia ?  Goals of care, counseling/discussion ?  ?* Multifocal pneumonia ?Presenting with altered mental status with fever, leukocytosis and chest x-ray findings of multifocal pneumonia.  Was initially requiring 7 L of oxygen.  Patient had acute organ dysfunction with lactic acidosis at 3.1.  MRSA PCR negative.  On  Zosyn.  Continue aspiration precautions. Continue nebulizers.  Procalcitonin was 5.1 on presentation which has trended down to 1.3.  Currently on 4 L of oxygen.  We will continue to wean as able. ? ?Acute metabolic encephalopathy ?Patient with recent admission for seizures and DKA now presenting with multifocal pneumonia. Continue antibiotic and aspiration precautions.  Delirium precautions.  Mild improvement in mental function. ? ?Sepsis ([place]) ?Secondary to multifocal pneumonia.  Leukocytosis at 15.1.  On IV Zosyn.  Urine culture negative.  Blood cultures negative in 3 days.  Significantly elevated procalcitonin on presentation which has trended down to 1.3.  He is afebrile Tmax of 98.8. ? ?Acute respiratory failure ([place]) ?Continue to multifocal pneumonia possibly aspiration on Zosyn.   We will continue to monitor.  Currently on 4 L of oxygen.  We will continue to wean as able ? ?Seizure disorder ([place]) ?Patient was diagnosed on previous hospitalization.  Was on Depakote but level was low.  At home patient takes Depakote and Klonopin. Receiving Depakote IV,and olanzapine while in the hospital until oral intake is adequate.  Been resumed on oral Klonopin at this time. ? ?Hypomagnesemia.  Improved after replacement.  Latest magnesium of 1.7. ? ?Hypokalemia.  Improved  after replacement.  Latest potassium of 4.0. ? ?Essential hypertension ?Not on antihypertensives at home.  Hydralazine as needed for now. ? ?Restless leg syndrome ?Continue Requip,  gabapentin. ? ?History of stroke with right-sided hemiparesis. ?Not on aspirin or Plavix as outpatient.  On Lipitor.  Continue low-dose aspirin. ? ?Status post colostomy ([place]) ?Hx diverticulitis with abscess s/p colostomy ?-Continue colostomy care. ? ?COPD, mild ([place]) ?Continue bronchodilators.  On supplemental oxygen at 4 L/min ? ?Protein-calorie malnutrition, severe ?Present on admission.  On dysphagia 2 diet at this time.  Aspiration precautions.  On Glucerna supplement.  Advance diet as tolerated. ? ?Skin excoriation.  No pressure ulcers.  Seen by wound care during hospitalization. ?  ?Dysphagia ?Patient with history of CVA with residual hemiparesis.  Speech therapy recommended dysphagia 2 diet.  High concern for aspiration.   ? ?History of COVID-19 ?Positive on 2/22, completed isolation. ? ?Diabetes mellitus type 2 in nonobese [facility]) ?She was recently hospitalized for DKA.    On dysphagia 2 diet.   Was on  Semglee 20 units twice daily, insulin sliding scale at home.  Latest hemoglobin A1c 15.0.  Has been put on Semglee 24 units at bedtime.  We will continue sliding scale insulin.  Just POC glucose of 148. ? ?Hyperlipidemia ?Continue Lipitor. ? 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 Continue pancreatic supplements  ? ?Goals of care, counseling/discussion ?Palliative care on board.  Full code at this time. ? ?Deconditioning/debility. ?Patient has been seen by physical therapy/Occupational Therapy who recommended skilled nursing facility placement.   ? ?DVT prophylaxis:   Lovenox subcu ? ?  Code Status: Full Code ? ?Family Communication:  ?I spoke with the patient's daughter on phone on 05/18/2021. ? ?Disposition Plan:  ? ?Home with home health/skilled nursing facility.  PT OT recommending skilled nursing facility.  Patient as well as patient's daughter wishes to go home on discharge ? ?Level of care: Progressive ? ?Status is: Inpatient ? ?Remains inpatient  appropriate because:  IV antibiotic,  pending clinical improvement, supplemental oxygen, ? ?Consultants:  ?PCCM  ?Palliative care ? ?Procedures:  ?None ? ?Antimicrobials:  ? ?Zosyn 4/3>> ? ?Subjective: ?Today, patient was seen and examined at bedside.  Patient appears to be little sleepy today.  Wishes to eat something.  Was agitated in the nighttime and was wanting to get out of the bed.  Patient on Posey vest and mittens. ? ?Objective: ?Vitals:  ? 05/19/21 2023 05/19/21 2317 05/20/21 0356 05/20/21 5176  ?BP: (!) 136/92 125/84 (!) 140/104 (!) 149/83  ?Pulse: 72 85 84 62  ?Resp: '16 16 18 18  '$ ?Temp: 98.4 ?F (36.9 ?C) 98 ?F (36.7 ?C) 98.8 ?F (37.1 ?C)   ?TempSrc: Oral     ?SpO2: 100% 100% 100% 100%  ?Weight:      ?Height:      ? ? ?Intake/Output Summary (Last 24 hours) at 05/20/2021 1054 ?Last data filed at 05/20/2021 0901 ?Gross per 24 hour  ?Intake 3 ml  ?Output 1150 ml  ?Net -1147 ml  ? ? ?Filed Weights  ? 05/15/21 1124  ?Weight: 44.1 kg  ? ? ?Physical Exam: ?General: Thinly built, not in obvious distress, chronically ill, older than stated age mildly somnolent ?HENT:   No scleral pallor or icterus noted. Oral mucosa is moist.  ?Chest: .  Diminished breath sounds bilaterally. No crackles or wheezes.  ?CVS: S1 &S2 heard. No murmur.  Regular rate and rhythm. ?Abdomen: Soft, nontender, nondistended.  Bowel sounds are heard.  Colostomy  bag in place. ?Extremities: No cyanosis, clubbing or edema.  Peripheral pulses are palpable. ?Psych: Mildly somnolent today, ?CNS: Moves all extremities. ?Skin: Warm and dry.  No rashes noted. ? ?Data Reviewed: I have personally reviewed the following labs and imaging studies.  ? ?CBC: ?Recent Labs  ?Lab 05/15/21 ?1323 05/15/21 ?1355 05/16/21 ?0254 05/17/21 ?0354 05/18/21 ?0153 05/19/21 ?5364 05/20/21 ?0154  ?WBC 41.5*  --  37.1* 22.8* 13.6* 9.5 15.1*  ?NEUTROABS 34.4*  --   --   --   --   --   --   ?HGB 8.9*   < > 8.4* 8.4* 9.2* 9.1* 9.6*  ?HCT 29.0*   < > 27.7* 25.8* 29.7* 29.0* 30.1*   ?MCV 95.1  --  95.2 90.8 93.1 90.3 91.2  ?PLT 636*  --  553* 461* 206 455* 452*  ? < > = values in this interval not displayed.  ? ? ?Basic Metabolic Panel: ?Recent Labs  ?Lab 05/16/21 ?0254 05/17/21 ?0354 05/18/21 ?015

## 2021-05-21 ENCOUNTER — Inpatient Hospital Stay (HOSPITAL_COMMUNITY): Payer: Medicare Other

## 2021-05-21 DIAGNOSIS — J9601 Acute respiratory failure with hypoxia: Secondary | ICD-10-CM | POA: Diagnosis not present

## 2021-05-21 DIAGNOSIS — J189 Pneumonia, unspecified organism: Secondary | ICD-10-CM | POA: Diagnosis not present

## 2021-05-21 DIAGNOSIS — G9341 Metabolic encephalopathy: Secondary | ICD-10-CM | POA: Diagnosis not present

## 2021-05-21 DIAGNOSIS — A419 Sepsis, unspecified organism: Secondary | ICD-10-CM | POA: Diagnosis not present

## 2021-05-21 LAB — BASIC METABOLIC PANEL
Anion gap: 5 (ref 5–15)
BUN: 10 mg/dL (ref 8–23)
CO2: 32 mmol/L (ref 22–32)
Calcium: 8.8 mg/dL — ABNORMAL LOW (ref 8.9–10.3)
Chloride: 104 mmol/L (ref 98–111)
Creatinine, Ser: 0.55 mg/dL (ref 0.44–1.00)
GFR, Estimated: >60 mL/min (ref >60)
Glucose, Bld: 45 mg/dL — ABNORMAL LOW (ref 70–99)
Potassium: 3.4 mmol/L — ABNORMAL LOW (ref 3.5–5.1)
Sodium: 141 mmol/L (ref 135–145)

## 2021-05-21 LAB — GLUCOSE, CAPILLARY
Glucose-Capillary: 158 mg/dL — ABNORMAL HIGH (ref 70–99)
Glucose-Capillary: 43 mg/dL — CL (ref 70–99)
Glucose-Capillary: 183 mg/dL — ABNORMAL HIGH (ref 70–99)
Glucose-Capillary: 284 mg/dL — ABNORMAL HIGH (ref 70–99)
Glucose-Capillary: 380 mg/dL — ABNORMAL HIGH (ref 70–99)
Glucose-Capillary: 285 mg/dL — ABNORMAL HIGH (ref 70–99)

## 2021-05-21 LAB — CBC
HCT: 29.1 % — ABNORMAL LOW (ref 36.0–46.0)
Hemoglobin: 9.2 g/dL — ABNORMAL LOW (ref 12.0–15.0)
MCH: 28.9 pg (ref 26.0–34.0)
MCHC: 31.6 g/dL (ref 30.0–36.0)
MCV: 91.5 fL (ref 80.0–100.0)
Platelets: 441 10*3/uL — ABNORMAL HIGH (ref 150–400)
RBC: 3.18 MIL/uL — ABNORMAL LOW (ref 3.87–5.11)
RDW: 16.8 % — ABNORMAL HIGH (ref 11.5–15.5)
WBC: 13.2 10*3/uL — ABNORMAL HIGH (ref 4.0–10.5)
nRBC: 0 % (ref 0.0–0.2)

## 2021-05-21 LAB — MAGNESIUM: Magnesium: 1.8 mg/dL (ref 1.7–2.4)

## 2021-05-21 MED ORDER — DEXTROSE 50 % IV SOLN
INTRAVENOUS | Status: AC
  Filled 2021-05-21: qty 50

## 2021-05-21 MED ORDER — POTASSIUM CHLORIDE 20 MEQ PO PACK
40.0000 meq | PACK | Freq: Two times a day (BID) | ORAL | Status: AC
  Administered 2021-05-21 (×2): 40 meq via ORAL
  Filled 2021-05-21 (×2): qty 2

## 2021-05-21 MED ORDER — DEXTROSE 50 % IV SOLN
INTRAVENOUS | Status: AC
  Administered 2021-05-21: 50 mL
  Filled 2021-05-21: qty 50

## 2021-05-21 NOTE — Progress Notes (Signed)
?PROGRESS NOTE ? ? ? ?LYRICK WORLAND  LXB:[id number] DOB: 07-Feb-1961 DOA: 05/15/2021 ?PCP: [facility]  ? ? ?Brief Narrative:  ? ?Brittany [name] is a 61 year old female with past medical history of essential hypertension, type 2 diabetes, CVA with right-sided hemiparesis, COPD, history of diverticulitis status post abscess and colostomy, anxiety and depression, discoid lupus, hyperlipidemia, s/p pancreatectomy/splenectomy, presented to hospital after she was found to be altered at the skilled nursing facility.  Patient was noted to be little hypoxic at the nursing home and had difficulty holding utensils.  Patient was then sent to the hospital for further evaluation.  Of note, patient does have history of recent prolonged hospitalization 2/22 - 3/28 for DKA, seizure disorder, COVID-19 viral infection and sepsis due to aspiration pneumonia, and behavioral disturbances.  ED patient was febrile with a temperature of 100.3 ?F, tachycardic, tachypneic and was hypoxic with oxygen saturation 90% on 7 L of high flow nasal cannula.  Initial labs showed elevated lactate with WBC at 41.5.  Procalcitonin elevated at 7.1.  CT head scan was reviewed which showed chronic right frontal left parietal occipital and cerebellar infarct.  Chest x-ray with pulmonary infiltrates suggestive of multifocal pneumonia with possible right small pleural effusion.  Patient was started on IV antibiotics and was considered for admission to the hospital for acute metabolic encephalopathy secondary to sepsis/multifocal pneumonia, high suspicion for aspiration.  ? ? Assessment and Plan: ? ?Principal Problem: ?  Multifocal pneumonia ?Active Problems: ?  Acute metabolic encephalopathy ?  Sepsis ([place]) ?  Acute respiratory failure ([place]) ?  Essential hypertension ?  Seizure disorder ([place]) ?  Restless leg syndrome ?  History of stroke ?  Status post colostomy [facility]) ?  COPD, mild ([place]) ?  Protein-calorie malnutrition, severe ?  Pressure injury of  skin ?  Intraductal papillary mucinous neoplasm of pancreas ?  HLD (hyperlipidemia) ?  Diabetes mellitus type 2 in nonobese [facility]) ?  History of COVID-19 ?  Dysphagia ?  Goals of care, counseling/discussion ?  ?* Multifocal pneumonia ?Presenting with altered mental status with fever, leukocytosis and chest x-ray findings of multifocal pneumonia.  Was initially requiring 7 L of oxygen.   MRSA PCR negative.  On  Zosyn.  Continue aspiration precautions. Continue nebulizers.  Procalcitonin was 5.1 on presentation which has trended down to 1.3.  Currently on 4 L of oxygen.  We will continue to wean as able. ? ?Acute metabolic encephalopathy ?Patient with recent admission for seizures and DKA now presenting with multifocal pneumonia. Continue antibiotic and aspiration precautions.  Continue delirium precautions.  Mild improvement in mental function.  Patient did have hypoglycemia as well.  Encourage oral intake ? ?Sepsis ([place]) ?Secondary to multifocal pneumonia.  Leukocytosis at 13.2 and slightly trending down..  On IV Zosyn.  Urine culture negative.  Blood cultures negative so far.  Significantly elevated procalcitonin on presentation which has trended down to 1.3.  He is afebrile Tmax of 98.9. ? ?Acute respiratory failure ([place]) ?Continue to multifocal pneumonia possibly aspiration on Zosyn.   We will continue to monitor.  Currently on 4 L of oxygen.  We will continue to wean as able ? ?Seizure disorder ([place]) ?Patient was diagnosed on previous hospitalization.  Was on Depakote but level was low.  At home patient takes Depakote and Klonopin. Receiving Depakote IV,and olanzapine while in the hospital.  Been resumed on oral Klonopin at this time. ? ?Hypomagnesemia.  Improved after replacement.  Latest magnesium of 1.8 ? ?Hypokalemia.  Latest  potassium of 3.4.  We will continue to replenish. ? ?Essential hypertension ?Not on antihypertensives at home.  Hydralazine as needed for now. ? ?Restless leg syndrome ?Continue Requip,  gabapentin. ? ?History of stroke with right-sided hemiparesis. ?Not on aspirin or Plavix as outpatient.  On Lipitor.  Continue low-dose aspirin. ? ?Status post colostomy ([place]) ?Hx diverticulitis with abscess s/p colostomy ?-Continue colostomy care. ? ?COPD, mild ([employer]) ?Continue bronchodilators.  On supplemental oxygen at 4 L/min, will continue to wean as able ? ?Protein-calorie malnutrition, severe ?Present on admission.  On dysphagia 2 diet at this time.  Aspiration precautions.  On Glucerna supplement.  Advance diet as tolerated. ? ?Skin excoriation.  No pressure ulcers.  Seen by wound care during hospitalization. ?  ?Dysphagia ?Patient with history of CVA with residual hemiparesis.  Speech therapy recommended dysphagia 2 diet.  High concern for aspiration.   ? ?History of COVID-19 ?Positive on 2/22, completed isolation. ? ?Diabetes mellitus type 2 in nonobese [facility]) ?Patient was recently hospitalized for DKA.    On dysphagia 2 diet.   Was on  Semglee 20 units twice daily, insulin sliding scale at home.  Latest hemoglobin A1c 15.0.  Has been put on Semglee 24 units at bedtime.  We will continue sliding scale insulin.  Latest POC glucose of 183. ? ?Hyperlipidemia ?Continue Lipitor. ? 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 Continue pancreatic supplements  ? ?Goals of care, counseling/discussion ?Palliative care on board.  Full code at this time. ? ?Deconditioning/debility. ?Patient has been seen by physical therapy/Occupational Therapy who recommended skilled nursing facility placement.   ? ?DVT prophylaxis:   Lovenox subcu ? ?  Code Status: Full Code ? ?Family Communication:  ?I spoke with the patient's daughter on phone on 05/18/2021. ? ?Disposition Plan:  ? ?Home with home health/skilled nursing facility.  PT/ OT recommending skilled nursing facility.  Patient as well as patient's daughter wishes to go home on discharge ? ?Level of care: Progressive ? ?Status is:  Inpatient ? ?Remains inpatient appropriate because:  IV antibiotic,  pending clinical improvement, supplemental oxygen, ? ?Consultants:  ?PCCM  ?Palliative care ? ?Procedures:  ?None ? ?Antimicrobials:  ? ?Zosyn 4/3>> ? ?Subjective: ?Today, patient was seen and examined at bedside.  Appears to be more alert awake and communicative today.  Denies shortness of breath chest pain palpitation. ? ?Objective: ?Vitals:  ? 05/21/21 8676 05/21/21 1950 05/21/21 0544 05/21/21 9326  ?BP: (!) 87/66 (!) 88/62 92/69 100/74  ?Pulse: 82  71 83  ?Resp:   16 16  ?Temp: 97.9 ?F (36.6 ?C)   (!) 97.5 ?F (36.4 ?C)  ?TempSrc: Oral     ?SpO2: 99%  99% 95%  ?Weight:      ?Height:      ? ? ?Intake/Output Summary (Last 24 hours) at 05/21/2021 1031 ?Last data filed at 05/20/2021 1800 ?Gross per 24 hour  ?Intake --  ?Output 1025 ml  ?Net -1025 ml  ? ?Filed Weights  ? 05/15/21 1124  ?Weight: 44.1 kg  ? ? ?Physical Exam: ? ?General: Thinly built, not in obvious distress, chronically ill, older than stated age, alert awake and communicative ?HENT:   No scleral pallor or icterus noted. Oral mucosa is moist.  ?Chest:   Diminished breath sounds bilaterally. No crackles or wheezes.  ?CVS: S1 &S2 heard. No murmur.  Regular rate and rhythm. ?Abdomen: Soft, nontender, nondistended.  Bowel sounds are heard.  Colostomy bag in place. ?Extremities: No cyanosis, clubbing or edema.  Peripheral  pulses are palpable. ?Psych: Mildly somnolent. ?CNS:  No cranial nerve deficits.  Moving all extremities  ?Skin: Warm and dry.  No rashes noted. ? ? ?Data Reviewed: I have personally reviewed the following labs and imaging studies.  ? ?CBC: ?Recent Labs  ?Lab 05/15/21 ?1323 05/15/21 ?1355 05/17/21 ?0354 05/18/21 ?0153 05/19/21 ?8325 05/20/21 ?0154 05/21/21 ?0406  ?WBC 41.5*   < > 22.8* 13.6* 9.5 15.1* 13.2*  ?NEUTROABS 34.4*  --   --   --   --   --   --   ?HGB 8.9*   < > 8.4* 9.2* 9.1* 9.6* 9.2*  ?HCT 29.0*   < > 25.8* 29.7* 29.0* 30.1* 29.1*  ?MCV 95.1   < > 90.8 93.1 90.3  91.2 91.5  ?PLT 636*   < > 461* 206 455* 452* 441*  ? < > = values in this interval not displayed.  ? ?Basic Metabolic Panel: ?Recent Labs  ?Lab 05/17/21 ?0354 05/18/21 ?0153 05/19/21 ?4982 05/20/21 ?6415 83

## 2021-05-21 NOTE — Progress Notes (Signed)
HOSPITAL MEDICINE OVERNIGHT EVENT NOTE   ? ?Notified by nursing the patient is exhibiting somewhat low blood pressures with blood pressurre of 88/62 followed by 92/69. ? ?Nursing reports no associated symptoms. ? ?Review of blood pressure trend throughout the past 24 hours reveals similar blood pressures.  We will manage conservatively and monitor. ? ?Vernelle Emerald  MD ?[facility]  ? ? ? ? ? ? ? ? ? ? ?

## 2021-05-21 NOTE — Progress Notes (Signed)
Pt BG 43 this am, pt resting with eyes closed, easily responds to voice, pt denies "feeling like her sugar was low", dextrose 32m administered. Pt tolerated well ? ?BG re-check = 158 ? ?Will monitor closely  ?

## 2021-05-21 NOTE — Progress Notes (Signed)
Patient has removed colostomy bag 2x.  RN has replaced bag.  Green safety mittens placed to remind patient not to removed medical equipment. ?

## 2021-05-22 DIAGNOSIS — G9341 Metabolic encephalopathy: Secondary | ICD-10-CM | POA: Diagnosis not present

## 2021-05-22 DIAGNOSIS — A419 Sepsis, unspecified organism: Secondary | ICD-10-CM | POA: Diagnosis not present

## 2021-05-22 DIAGNOSIS — J9601 Acute respiratory failure with hypoxia: Secondary | ICD-10-CM | POA: Diagnosis not present

## 2021-05-22 DIAGNOSIS — J189 Pneumonia, unspecified organism: Secondary | ICD-10-CM | POA: Diagnosis not present

## 2021-05-22 LAB — CBC
HCT: 28.6 % — ABNORMAL LOW (ref 36.0–46.0)
Hemoglobin: 8.8 g/dL — ABNORMAL LOW (ref 12.0–15.0)
MCH: 28.8 pg (ref 26.0–34.0)
MCHC: 30.8 g/dL (ref 30.0–36.0)
MCV: 93.5 fL (ref 80.0–100.0)
Platelets: 432 10*3/uL — ABNORMAL HIGH (ref 150–400)
RBC: 3.06 MIL/uL — ABNORMAL LOW (ref 3.87–5.11)
RDW: 17.1 % — ABNORMAL HIGH (ref 11.5–15.5)
WBC: 8.9 10*3/uL (ref 4.0–10.5)
nRBC: 0.2 % (ref 0.0–0.2)

## 2021-05-22 LAB — GLUCOSE, CAPILLARY
Glucose-Capillary: 179 mg/dL — ABNORMAL HIGH (ref 70–99)
Glucose-Capillary: 164 mg/dL — ABNORMAL HIGH (ref 70–99)
Glucose-Capillary: 169 mg/dL — ABNORMAL HIGH (ref 70–99)
Glucose-Capillary: 435 mg/dL — ABNORMAL HIGH (ref 70–99)
Glucose-Capillary: 379 mg/dL — ABNORMAL HIGH (ref 70–99)

## 2021-05-22 LAB — BASIC METABOLIC PANEL
Anion gap: 6 (ref 5–15)
BUN: 12 mg/dL (ref 8–23)
CO2: 27 mmol/L (ref 22–32)
Calcium: 9 mg/dL (ref 8.9–10.3)
Chloride: 102 mmol/L (ref 98–111)
Creatinine, Ser: 0.64 mg/dL (ref 0.44–1.00)
GFR, Estimated: >60 mL/min (ref >60)
Glucose, Bld: 267 mg/dL — ABNORMAL HIGH (ref 70–99)
Potassium: 4.9 mmol/L (ref 3.5–5.1)
Sodium: 135 mmol/L (ref 135–145)

## 2021-05-22 LAB — MAGNESIUM: Magnesium: 1.6 mg/dL — ABNORMAL LOW (ref 1.7–2.4)

## 2021-05-22 MED ORDER — DIVALPROEX SODIUM ER 500 MG PO TB24
1250.0000 mg | ORAL_TABLET | Freq: Every day | ORAL | Status: DC
  Administered 2021-05-22: 1250 mg via ORAL
  Filled 2021-05-22 (×2): qty 1

## 2021-05-22 NOTE — Progress Notes (Signed)
Physical Therapy Treatment ?Patient Details ?Name: Brittany [name] ?MRN: [id number] ?DOB: [birth year] ?Today's Date: 05/22/2021 ? ? ?History of Present Illness Pt. is a 60 y.o. F presenting to [facility] on [facility] for AMS from SNF secondary to PNA and sepsis. She was recently d/c on 3/28 for new-onset of seizure with sepsis due to aspiration PNA and DKA, COVID+. 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 ? ?  ?PT Comments  ? ? Focus of session today was functional transfers, gait trial, and standing balance. The patient tolerated well.  Pt. Shows overall improvement with her functional mobility, standing balance, and gait ability. She was able to walk 60 feet with HHA and IV pole with mod A for trunk support and cuing for proper BOS. Her cognition, bed mobility, transfer ability, static balance, dynamic balance, and strength are still limiting function. Pt. Would benefit from skilled PT to continue to address her functional mobility, strength, balance, and gait as tolerated. Plan and discharge setting need to be updated to home with her daughter and with maximum [facility] services. Per CSW if pt is ambulatory her daughter would like to take her home with her and can provide appropriate 24/7 support. Pt to follow acutely as appropriate.  ?   ?Recommendations for follow up therapy are one component of a multi-disciplinary discharge planning process, led by the attending physician.  Recommendations may be updated based on patient status, additional functional criteria and insurance authorization. ? ?Follow Up Recommendations ? Home health PT (per pt's daughter if ambulatory she would like for her to go home with her and get  [facility]) ?  ?  ?Assistance Recommended at Discharge Frequent or constant Supervision/Assistance  ?Patient can return home with the following Assistance with  cooking/housework;Direct supervision/assist for medications management;Help with stairs or ramp for entrance;Assist for transportation;A lot of help with walking and/or transfers;A lot of help with bathing/dressing/bathroom ?  ?Equipment Recommendations ? None recommended by PT  ?  ?   ?Precautions / Restrictions Precautions ?Precautions: Fall ?Precaution Comments: seizure, urinary incontinence, colostomy bag frequently comes off ?Restrictions ?Weight Bearing Restrictions: No  ?  ? ?Mobility ? Bed Mobility ?Overal bed mobility: Needs Assistance ?  ?  ?  ?  ?  ?  ?General bed mobility comments: Pt. in chair at nurses station upon arrival ?Patient Response: Impulsive ? ?Transfers ?Overall transfer level: Needs assistance ?Equipment used: 1 person hand held assist ?Transfers: Sit to/from Stand ?Sit to Stand: Mod assist ?  ?  ?  ?  ?  ?  ?  ? ?Ambulation/Gait ?Ambulation/Gait assistance: Mod assist ?Gait Distance (Feet): 60 Feet ?Assistive device: IV Pole, 1 person hand held assist ?Gait Pattern/deviations: Narrow base of support, Knee flexed in stance - left, Knee flexed in stance - right, Drifts right/left, Decreased step length - left, Decreased step length - right, Step-through pattern, Trunk flexed ?Gait velocity: decr ?  ?  ?General Gait Details: Pt. adopts a vary narrowed gait, almost heel-toe. Cues for wider bos and upright trunk. She requires mod truncal assist to maintain balance and UE assist. ? ? ? ? ? ?  ?Balance Overall balance assessment: Needs assistance ?Sitting-balance support: No upper extremity supported, Feet supported ?Sitting balance-Leahy Scale: Good ?Sitting balance - Comments: Pt. able to sit in chair with back unsupported w/o difficulty ?Postural control: Posterior lean ?Standing balance support: Bilateral upper extremity supported, During functional activity ?Standing balance-Leahy Scale:  Poor ?  ?  ?  ?  ?  ?  ?  ?  ?  ?  ?  ?  ?  ? ?  ?Cognition Arousal/Alertness: Awake/alert ?Behavior  During Therapy: Flat affect ?Overall Cognitive Status: Impaired/Different from baseline ?Area of Impairment: Following commands, Safety/judgement, Awareness, Problem solving, Attention ?  ?  ?  ?  ?  ?  ?  ?  ?  ?Current Attention Level: Selective ?  ?Following Commands: Follows one step commands with increased time, Follows multi-step commands inconsistently ?Safety/Judgement: Decreased awareness of safety, Decreased awareness of deficits ?Awareness: Emergent ?Problem Solving: Requires verbal cues, Requires tactile cues, Slow processing ?General Comments: Pt. is weapy at the begining of the session, she was comforted and agreeable to attempt walking. ?  ?  ? ?  ?   ?General Comments General comments (skin integrity, edema, etc.): Pt is emotional at the beginning of the session, stating "that woman needs to be put in jail." SPT provided support and she was willing to attempt gait. ?  ?  ? ?Pertinent Vitals/Pain Pain Assessment ?Pain Assessment: No/denies pain  ? ? ? ?PT Goals (current goals can now be found in the care plan section) Acute Rehab PT Goals ?Patient Stated Goal: To improve her strength and return home. ?PT Goal Formulation: With patient ?Time For Goal Achievement: 05/31/21 ?Potential to Achieve Goals: Fair ?Progress towards PT goals: Progressing toward goals ? ?  ?Frequency ? ? ? Min 3X/week ? ? ? ?  ?PT Plan Discharge plan needs to be updated  ? ? ?   ?AM-PAC PT "6 Clicks" Mobility   ?Outcome Measure ? Help needed turning from your back to your side while in a flat bed without using bedrails?: A Little ?Help needed moving from lying on your back to sitting on the side of a flat bed without using bedrails?: A Little ?Help needed moving to and from a bed to a chair (including a wheelchair)?: A Little ?Help needed standing up from a chair using your arms (e.g., wheelchair or bedside chair)?: A Lot ?Help needed to walk in hospital room?: A Lot ?Help needed climbing 3-5 steps with a railing? : Total ?6  Click Score: 14 ? ?  ?End of Session Equipment Utilized During Treatment: Gait belt ?Activity Tolerance: Patient tolerated treatment well ?Patient left: in chair;with nursing/sitter in room (Pt. sitting at nursing station with her RN, RN states she will get her back to her room.) ?Nurse Communication: Mobility status ?PT Visit Diagnosis: Other abnormalities of gait and mobility (R26.89);Muscle weakness (generalized) (M62.81);Other symptoms and signs involving the nervous system (R29.898) ?  ? ? ?Time: 5102-5852 ?PT Time Calculation (min) (ACUTE ONLY): 14 min ? ?Charges:  $Gait Training: 8-22 mins          ?          ? ?Thermon Leyland, SPT ?Acute Rehab Services ? ? ? ?Thermon Leyland ?05/22/2021, 4:17 PM ? ?

## 2021-05-22 NOTE — Progress Notes (Signed)
Speech Language Pathology Treatment: Dysphagia  ?Patient Details ?Name: Brittany [name] ?MRN: [id number] ?DOB: April 26, 1960 ?Today's Date: 05/22/2021 ?Time: 0912-0930 ?SLP Time Calculation (min) (ACUTE ONLY): 18 min ? ?Assessment / Plan / Recommendation ?Clinical Impression ? Pt was seen for dysphagia treatment. She was alert with verbal stimulation, but her ability to maintain this level of alertness for prolonged periods was suboptimal. She tolerated dysphagia 3 solids and individual boluses of thin liquids via straw without overt s/sx of aspiration. Coughing was noted once with five consecutive swallows of thin liquids when she was slightly lethargic, but she subsequently tolerated consecutive swallows without difficulty. Mastication was mildly prolonged, but functional and oral clearance was adequate. Pt's diet will be advanced to dysphagia 3 solids and thin liquids. Supervision and assistance with meals is still recommended. SLP will continue to follow pt.   ?  ?HPI HPI: Patient is a 61 y.o. female with PMH: HTN, DM, CVA with right hemiparesis, COPD s/p pancreatectomy/splenectomy, diverticulitis with abscess leading to colostomy (03/10/21), anxiety, depression, HLD, discoid lupus, recently hospitalized 2/22-3/28 for new onset seizures with sepsis due to aspiration PNA and DKA and was also COVID+. She had discharged 3/28 to [facility] but returned to hospital 4/3 with AMS. In ED she presented with gurgling, hypoxia, multifocal PNA on CXR. ?  ?   ?SLP Plan ? Continue with current plan of care ? ?  ?  ?Recommendations for follow up therapy are one component of a multi-disciplinary discharge planning process, led by the attending physician.  Recommendations may be updated based on patient status, additional functional criteria and insurance authorization. ?  ? ?Recommendations  ?Diet recommendations: Dysphagia 3 (mechanical soft);Thin liquid ?Liquids provided via: Cup;Straw ?Medication Administration: Whole meds  with puree ?Supervision: Staff to assist with self feeding;Full supervision/cueing for compensatory strategies ?Compensations: Slow rate;Small sips/bites ?Postural Changes and/or Swallow Maneuvers: Seated upright 90 degrees  ?   ?    ?   ? ? ? ? Oral Care Recommendations: Oral care BID ?Follow Up Recommendations:  (TBD) ?Assistance recommended at discharge: Frequent or constant Supervision/Assistance ?SLP Visit Diagnosis: Dysphagia, unspecified (R13.10) ?Plan: Continue with current plan of care ? ? ? ? ?  ?  ? ?Brittany [name] I. Hardin Negus, [place], CCC-SLP ?Acute Rehabilitation Services ?Office number [phone] ?Pager [phone] ? ? ?Brittany [name] ? ?05/22/2021, 9:30 AM ? ? ? ?

## 2021-05-22 NOTE — TOC Progression Note (Signed)
Transition of Care (TOC) - Progression Note  ? ? ?Patient Details  ?Name: CARLEN REBUCK ?MRN: [id number] ?Date of Birth: Apr 29, 1960 ? ?Transition of Care (TOC) CM/SW Contact  ?Geralynn Ochs, LCSW ?Phone Number: ?05/22/2021, 10:18 AM ? ?Clinical Narrative:   Patient has no bed offers at this time. CSW sent referrals again and asked pending SNF to review. CSW to follow. ? ? ? ?Expected Discharge Plan: [place] ?Barriers to Discharge: Continued Medical Work up, Ship broker ? ?Expected Discharge Plan and Services ?Expected Discharge Plan: [place] ?  ?  ?Post Acute Care Choice: [place] ?Living arrangements for the past 2 months: [place], [place] ?                ?  ?  ?  ?  ?  ?  ?  ?  ?  ?  ? ? ?Social Determinants of Health (SDOH) Interventions ?  ? ?Readmission Risk Interventions ? ?  03/06/2021  ?  9:13 AM 02/16/2021  ?  2:45 PM  ?Readmission Risk Prevention Plan  ?Transportation Screening Complete Complete  ?Medication Review Press photographer) Complete Complete  ?PCP or Specialist appointment within 3-5 days of discharge Complete   ?SW Recovery Care/Counseling Consult  Complete  ?Palliative Care Screening Not Applicable Not Applicable  ?[place] Not Applicable Not Applicable  ? ? ?

## 2021-05-22 NOTE — Progress Notes (Signed)
?PROGRESS NOTE ? ? ? ?Brittany [name]  XKG:[id number] DOB: 1960/02/14 DOA: 05/15/2021 ?PCP: [facility]  ? ? ?Brief Narrative:  ? ?Brittany [name] is a 61 year old female with past medical history of essential hypertension, type 2 diabetes, CVA with right-sided hemiparesis, COPD, history of diverticulitis status post abscess and colostomy, anxiety and depression, discoid lupus, hyperlipidemia, s/p pancreatectomy/splenectomy, presented to the hospital after she was found to be altered at the skilled nursing facility.  Patient was noted to be little hypoxic at the nursing home and had difficulty holding utensils.  Patient was then sent to the hospital for further evaluation.  Of note, patient does have history of recent prolonged hospitalization 2/22 - 3/28 for DKA, seizure disorder, COVID-19 viral infection and sepsis due to aspiration pneumonia, and behavioral disturbances.  In the ED, patient was febrile with a temperature of 100.3 ?F, tachycardic, tachypneic and was hypoxic with oxygen saturation 90% on 7 L of high flow nasal cannula.  Initial labs showed elevated lactate with WBC at 41.5.  Procalcitonin elevated at 7.1.  CT head scan was reviewed which showed chronic right frontal left parietal occipital and cerebellar infarct.  Chest x-ray with pulmonary infiltrates suggestive of multifocal pneumonia with possible right small pleural effusion.  Patient was started on IV antibiotics and was considered for admission to the hospital for acute metabolic encephalopathy secondary to sepsis/multifocal pneumonia, high suspicion for aspiration.  ? ? Assessment and Plan: ? ?Principal Problem: ?  Multifocal pneumonia ?Active Problems: ?  Acute metabolic encephalopathy ?  Sepsis ([place]) ?  Acute respiratory failure ([place]) ?  Essential hypertension ?  Seizure disorder ([place]) ?  Restless leg syndrome ?  History of stroke ?  Status post colostomy [facility]) ?  COPD, mild ([place]) ?  Protein-calorie malnutrition, severe ?  Pressure  injury of skin ?  Intraductal papillary mucinous neoplasm of pancreas ?  HLD (hyperlipidemia) ?  Diabetes mellitus type 2 in nonobese [facility]) ?  History of COVID-19 ?  Dysphagia ?  Goals of care, counseling/discussion ?  ?* Multifocal pneumonia ?Presenting with altered mental status with fever, leukocytosis and chest x-ray findings of multifocal pneumonia.  Was initially requiring 7 L of oxygen.  Currently on Room air.  MRSA PCR negative.  Has completed 7-day course of Zosyn.  Patient has risk for aspiration pneumonia in the future as well.  Continue aspiration precautions. Continue nebulizers.  Procalcitonin was 7.1 on presentation which has trended down to 1.3 at this time. ? ?Acute metabolic encephalopathy ?Patient with recent admission for seizures and DKA now presenting with multifocal pneumonia. Continue antibiotic and aspiration precautions.  Continue delirium precautions.   ? ?Sepsis ([place]) ?Secondary to multifocal pneumonia.  Completed course of Zosyn.  Leukocytosis has resolved.  Urine culture negative.  Blood cultures negative so far. ? ?Acute respiratory failure ([place]) ?Improved at this time.  Currently on room air.  Completed treatment for pneumonia.   ? ?Seizure disorder ([place]) ?Patient was diagnosed on previous hospitalization.  Was on Depakote but level was low.  At home, patient takes Depakote and Klonopin. Receiving Depakote IV,and olanzapine while in the hospital.  Resume p.o. Depakote and Klonopin from home.   ? ?Hypomagnesemia.  We will continue to replenish.  Latest magnesium of 1.6 ? ?Hypokalemia.  Improved after replacement.  Latest potassium of 4.9.. ? ?Essential hypertension ?Not on antihypertensives at home.  Hydralazine as needed for now. ? ?Restless leg syndrome ?Continue Requip, gabapentin. ? ?History of stroke with right-sided hemiparesis. ?Not on aspirin  or Plavix as outpatient.  On Lipitor.  Continue low-dose aspirin. ? ?Status post colostomy ([place]) ?Hx diverticulitis with abscess s/p  colostomy ?-Continue colostomy care. ? ?COPD, mild ([place]) ?Continue bronchodilators.  On room air at this time.  Compensated. ? ?Protein-calorie malnutrition, severe ?Seen by speech and swallow.  On dysphagia 3 diet at this time.  Continue aspiration precautions  ? ?Skin excoriation.  No pressure ulcers.  Seen by wound care during hospitalization. ?  ?Dysphagia ?Patient with history of CVA with residual hemiparesis.  Speech therapy recommended dysphagia 3 diet at this time..  High concern for aspiration.   ? ?History of COVID-19 ?Positive on 2/22, completed isolation. ? ?Diabetes mellitus type 2 in nonobese [facility]) ?Patient was recently hospitalized for DKA.    On dysphagia 3 diet.   Was on  Semglee 20 units twice daily, insulin sliding scale at home.  Latest hemoglobin A1c of 15.0.  Currently on Semglee 24 units at bedtime.  We will continue sliding scale insulin.  Latest POC glucose of 164. ? ?Hyperlipidemia ?Continue Lipitor. ? 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 Continue pancreatic supplements  ? ?Goals of care, counseling/discussion ?Palliative care on board.  Full code at this time. ? ?Deconditioning/debility. ?Patient has been seen by physical therapy/Occupational Therapy who recommended skilled nursing facility placement.   ? ?DVT prophylaxis:   Lovenox subcu ? ?  Code Status: Full Code ? ?Family Communication:  ?I spoke with the patient's daughter on phone on 05/22/2021 and updated her about the clinical condition of the patient.. ? ?Disposition Plan:   PT/ OT recommending skilled nursing facility.  If significantly improved, plan for home with home health on 05/23/2021 ? ?Level of care: Progressive ? ?Status is: Inpatient ? ?Remains inpatient appropriate because: Debility. deconditioning, possible need for rehabilitation ? ?Consultants:  ?PCCM  ?Palliative care ? ?Procedures:  ?None ? ?Antimicrobials:  ? ?Zosyn 4/3>> completed ? ?Subjective: ?Today, in and  examined at bedside.  Patient denies any shortness of breath, chest pain, fever, chills or rigor.  ? ?Objective: ?Vitals:  ? 05/22/21 0037 05/22/21 0451 05/22/21 0740 05/22/21 1121  ?BP: (!) 147/105 119/75 106/82 109/78  ?Pulse: 89 88 (!) 101 91  ?Resp: '18 18 20   '$ ?Temp: 97.6 ?F (36.4 ?C) 98.1 ?F (36.7 ?C) 99.8 ?F (37.7 ?C) 98.5 ?F (36.9 ?C)  ?TempSrc: Oral Oral Oral Oral  ?SpO2: 97% 93% 100% 99%  ?Weight:      ?Height:      ? ? ?Intake/Output Summary (Last 24 hours) at 05/22/2021 1336 ?Last data filed at 05/22/2021 1000 ?Gross per 24 hour  ?Intake 240 ml  ?Output 1950 ml  ?Net -1710 ml  ? ? ?Filed Weights  ? 05/15/21 1124  ?Weight: 44.1 kg  ? ? ?Physical Exam: ?General: Thinly built, not in obvious distress, chronically ill, older than stated age, alert awake and communicative, deconditioned ?HENT:   No scleral pallor or icterus noted. Oral mucosa is moist.  ?Chest:  .  Diminished breath sounds bilaterally.  ?CVS: S1 &S2 heard. No murmur.  Regular rate and rhythm. ?Abdomen: Soft, nontender, nondistended.  Bowel sounds are heard.   ?Extremities: No cyanosis, clubbing or edema.  Peripheral pulses are palpable. ?Psych: Alert, awake and communicative , oriented to place and self ?CNS:  No cranial nerve deficits.  Moves all extremities, generalized weakness. ?Skin: Warm and dry.  No rashes noted. ? ?Data Reviewed: I have personally reviewed the following labs and imaging studies.  ? ?CBC: ?Recent Labs  ?  Lab 05/18/21 ?0153 05/19/21 ?2549 05/20/21 ?0154 05/21/21 ?0406 05/22/21 ?8264  ?WBC 13.6* 9.5 15.1* 13.2* 8.9  ?HGB 9.2* 9.1* 9.6* 9.2* 8.8*  ?HCT 29.7* 29.0* 30.1* 29.1* 28.6*  ?MCV 93.1 90.3 91.2 91.5 93.5  ?PLT 206 455* 452* 441* 432*  ? ? ?Basic Metabolic Panel: ?Recent Labs  ?Lab 05/17/21 ?0354 05/18/21 ?0153 05/19/21 ?1583 05/20/21 ?0154 05/21/21 ?0406 05/22/21 ?0940  ?NA 136 135 135 140 141 135  ?K 3.0* 4.8 3.9 4.0 3.4* 4.9  ?CL 102 105 102 101 104 102  ?CO2 '28 22 24 30 '$ 32 27  ?GLUCOSE 120* 216* 304* 178* 45* 267*   ?BUN 11 12 7* 5* 10 12  ?CREATININE 0.76 0.65 0.61 0.55 0.55 0.64  ?CALCIUM 8.4* 8.3* 9.2 8.9 8.8* 9.0  ?MG 1.4*  --  1.5* 1.7 1.8 1.6*  ?PHOS 4.3  --  3.0  --   --   --   ? ? ?GFR: ?Estimated Creatinine C

## 2021-05-22 NOTE — Progress Notes (Signed)
?   05/22/21 1000  ?Colostomy RLQ  ?Placement Date/Time: 03/10/21 0942   Person Inserting LDA: Dr. Dahlia Byes  Location: RLQ  ?Ostomy Pouch 2 piece  ?Stoma Assessment Red  ?Peristomal Assessment Intact  ?Treatment Pouch change;Powder (Pectin);Skin sealant  ?Output (mL) 600 mL  ? ? ?Patient removed ostomy pouch and is covered in stool.  Bath given by NT and this RN.  New wafer and pouch applied by this RN.  Green mittens applied for safety. ?

## 2021-05-23 DIAGNOSIS — R4182 Altered mental status, unspecified: Secondary | ICD-10-CM

## 2021-05-23 DIAGNOSIS — J449 Chronic obstructive pulmonary disease, unspecified: Secondary | ICD-10-CM | POA: Diagnosis not present

## 2021-05-23 DIAGNOSIS — E119 Type 2 diabetes mellitus without complications: Secondary | ICD-10-CM | POA: Diagnosis not present

## 2021-05-23 DIAGNOSIS — J9601 Acute respiratory failure with hypoxia: Secondary | ICD-10-CM | POA: Diagnosis not present

## 2021-05-23 DIAGNOSIS — E162 Hypoglycemia, unspecified: Secondary | ICD-10-CM

## 2021-05-23 LAB — GLUCOSE, CAPILLARY
Glucose-Capillary: 41 mg/dL — CL (ref 70–99)
Glucose-Capillary: 127 mg/dL — ABNORMAL HIGH (ref 70–99)
Glucose-Capillary: 414 mg/dL — ABNORMAL HIGH (ref 70–99)

## 2021-05-23 LAB — BASIC METABOLIC PANEL
Anion gap: 9 (ref 5–15)
BUN: 19 mg/dL (ref 8–23)
CO2: 28 mmol/L (ref 22–32)
Calcium: 8.5 mg/dL — ABNORMAL LOW (ref 8.9–10.3)
Chloride: 96 mmol/L — ABNORMAL LOW (ref 98–111)
Creatinine, Ser: 0.67 mg/dL (ref 0.44–1.00)
GFR, Estimated: >60 mL/min (ref >60)
Glucose, Bld: 395 mg/dL — ABNORMAL HIGH (ref 70–99)
Potassium: 5.2 mmol/L — ABNORMAL HIGH (ref 3.5–5.1)
Sodium: 133 mmol/L — ABNORMAL LOW (ref 135–145)

## 2021-05-23 MED ORDER — GLUCERNA SHAKE PO LIQD: 237.0000 mL | Freq: Three times a day (TID) | ORAL | 0 refills | Status: DC

## 2021-05-23 MED ORDER — ASPIRIN 81 MG PO TBEC
81.0000 mg | DELAYED_RELEASE_TABLET | Freq: Every day | ORAL | 11 refills | Status: DC
Start: 2021-05-23 — End: 2021-05-23

## 2021-05-23 MED ORDER — ALBUTEROL SULFATE HFA 108 (90 BASE) MCG/ACT IN AERS: 2.0000 | INHALATION_SPRAY | Freq: Four times a day (QID) | RESPIRATORY_TRACT | 2 refills | Status: DC | PRN

## 2021-05-23 MED ORDER — MAGNESIUM OXIDE -MG SUPPLEMENT 400 (240 MG) MG PO TABS: 400.0000 mg | ORAL_TABLET | Freq: Every day | ORAL | 0 refills | Status: AC

## 2021-05-23 MED ORDER — MAGNESIUM OXIDE -MG SUPPLEMENT 400 (240 MG) MG PO TABS: 400.0000 mg | ORAL_TABLET | Freq: Every day | ORAL | 0 refills | Status: DC

## 2021-05-23 MED ORDER — GUAIFENESIN ER 600 MG PO TB12
600.0000 mg | ORAL_TABLET | Freq: Two times a day (BID) | ORAL | 0 refills | Status: AC | PRN
Start: 2021-05-23 — End: 2021-06-07

## 2021-05-23 MED ORDER — ASPIRIN 81 MG PO TBEC: 81.0000 mg | DELAYED_RELEASE_TABLET | Freq: Every day | ORAL | 11 refills | Status: DC

## 2021-05-23 MED ORDER — GLUCERNA SHAKE PO LIQD: 237.0000 mL | Freq: Three times a day (TID) | ORAL | 0 refills | Status: AC

## 2021-05-23 MED ORDER — INSULIN GLARGINE-YFGN 100 UNIT/ML ~~LOC~~ SOLN
15.0000 [IU] | Freq: Two times a day (BID) | SUBCUTANEOUS | Status: DC
Start: 2021-05-23 — End: 2021-06-27

## 2021-05-23 MED ORDER — GUAIFENESIN ER 600 MG PO TB12: 600.0000 mg | ORAL_TABLET | Freq: Two times a day (BID) | ORAL | 0 refills | Status: DC | PRN

## 2021-05-23 NOTE — Discharge Summary (Addendum)
?Physician Discharge Summary ?  ?[name] MRN: [id number] DOB: 1960-05-23  ?Admit date:     05/15/2021  ?Discharge date: 05/23/21  ?Discharge Physician: Corrie Mckusick Majestic Molony  ? ?PCP: [facility]  ? ?Recommendations at discharge:  ? ?Follow-up with primary care physician in 1 week.  Check CBC BMP magnesium, phosphorus in the next visit.Marland Kitchen ?Patient would need good glycemic control.  Patient was hypoglycemic during hospitalization so long-acting dose has been decreased and diet has been liberalized to regular.  We will need to adjust as outpatient ?Patient has high risk for aspiration.  On dysphagia diet at this time. ? ?Discharge Diagnoses: ?Principal Problem: ?  Multifocal pneumonia ?Active Problems: ?  Essential hypertension ?  Seizure disorder ([place]) ?  Restless leg syndrome ?  History of stroke ?  Status post colostomy [facility] ?  COPD, mild ([place]) ?  Protein-calorie malnutrition, severe ?  Pressure injury of skin ?  Intraductal papillary mucinous neoplasm of pancreas ?  HLD (hyperlipidemia) ?  Diabetes mellitus type 2 in nonobese [facility]) ?  History of COVID-19 ?  Dysphagia ?  Goals of care, counseling/discussion ? ?Resolved Problems: ?  Acute metabolic encephalopathy ?  Sepsis ([place]) ?  Acute respiratory failure ([place] [facility]) ? ?Hospital Course: ? ?Multifocal pneumonia ?Presenting with altered mental status with fever, leukocytosis and chest x-ray findings of multifocal pneumonia.  Was initially requiring 7 L of oxygen.  Patient was subsequently weaned to room air and has remained stable at this time.  MRSA PCR negative.  Has completed 7-day course of Zosyn.  Patient has risk for aspiration pneumonia in the future as well.  Patient has been seen by speech therapy and recommend dysphagia diet.   Procalcitonin was 7.1 on presentation which has trended down to 1.3 at this time. ?  ?Acute metabolic encephalopathy ?Has improved at this time.  Likely at baseline.  Patient with recent admission for seizures and DKA at  this time presented with multifocal pneumonia.  Completed antibiotic and advised aspiration precautions.  ?  ?Sepsis ([place]) ?Secondary to multifocal pneumonia.  Completed course of Zosyn.  Leukocytosis has resolved.  Urine culture negative.  Blood cultures negative so far. ?  ?Acute respiratory failure ([place]) ?Resolved at this time.  Currently on room air.  Completed treatment for pneumonia.   ?  ?Seizure disorder ([place]) ?Patient was diagnosed of seizure disorder on previous hospitalization.  Was on Depakote but level was low on this presentation..  At home, patient takes Depakote and Klonopin.  Resume p.o. Depakote and Klonopin from home on discharge..   ?  ?Hypomagnesemia.  Continue magnesium for few more days on discharge. ? ?Hypokalemia.  Improved after replacement.  Latest potassium of 4.9.. ?  ?Essential hypertension ?Not on antihypertensives at home.  Remained marginally on the lower side.  Might need to reassess as outpatient ?  ?Restless leg syndrome ?Continue Requip, gabapentin. ?  ?History of stroke with right-sided hemiparesis. ?Not on aspirin or Plavix as outpatient.  On Lipitor.  Continue low-dose aspirin. ?  ?Status post colostomy ([place]) ?Hx diverticulitis with abscess s/p colostomy ?-Continue colostomy care as outpatient. ?  ?COPD, mild ([place]) ?We will continue albuterol on discharge.  On room air at this time.  Compensated. ?  ?Protein-calorie malnutrition, severe ?Seen by speech and swallow.  On dysphagia 3 die during hospitalization.  We will recommend soft chopped diet with aspiration precautions on discharge.  Continue nutritional supplements on discharge ?  ?Skin excoriation.  No pressure ulcers.  Seen by wound  care during hospitalization. ?  ?Dysphagia ?Patient with history of CVA with residual hemiparesis.  Speech therapy recommended dysphagia 3 diet at this time..  High risk for aspiration.   ? ?History of COVID-19 ?Positive on 2/22, completed isolation. ?  ?Diabetes mellitus type 2 in nonobese  [facility]) ?Patient was recently hospitalized for DKA.    On dysphagia 3 diet.   Was on  Semglee 20 units twice daily at home this has been decreased to 15 units twice daily due to episodes of hypoglycemia in the hospital.  Latest hemoglobin A1c however of 15.0.  Need to discuss with her primary care provider about good glycemic control as outpatient  ? ?hyperlipidemia ?Continue Lipitor. ?  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 Continue pancreatic supplements from home. ?  ?Goals of care, counseling/discussion ?Palliative care was consulted.  Full code at this time. ?  ?Deconditioning/debility. ?Patient has been seen by physical therapy/Occupational Therapy who recommended skilled nursing facility placement but at this time plan is home with home health.  Family wishing patient to be discharged home. ? ?Pressure injury of the bilateral buttocks stage I.  Present on admission.  Continue prevention protocol. ? ?Consultants:  ?PCCM ?Palliative care ? ?Procedures performed: None ? ?Disposition: Home health.  Communicated with the patient's daughter on 05/22/2021 regarding plan for disposition.  I also spoke with the patient's nephew at the bedside prior to discharge ? ?Diet recommendation:  ?Discharge Diet Orders (From admission, onward)  ? ?  Start     Ordered  ? 05/23/21 0000  Diet general       ?Comments: Soft chopped diet with thin liquids  ? 05/23/21 0934  ? ?  ?  ? ?  ? ?Regular diet ?DISCHARGE MEDICATION: ?Allergies as of 05/23/2021   ? ?   Reactions  ? Cephalosporins Itching  ? TOLERATED ZOSYN (PIPERACILLIN) BEFORE  ? Hydromorphone Itching  ? Keflin [cephalothin] Itching  ? Lactose Intolerance (gi) Diarrhea  ? ?  ? ?  ?Medication List  ?  ? ?TAKE these medications   ? ?albuterol 108 (90 Base) MCG/ACT inhaler ?Commonly known as: VENTOLIN HFA ?Inhale 2 puffs into the lungs every 6 (six) hours as needed for wheezing or shortness of breath. ?  ?aspirin 81 MG EC  tablet ?Take 1 tablet (81 mg total) by mouth daily. Swallow whole. ?  ?atorvastatin 40 MG tablet ?Commonly known as: LIPITOR ?Take 1 tablet (40 mg total) by mouth Nightly. ?What changed: when to take this ?  ?camphor-menthol lotion ?Commonly known as: SARNA ?Apply topically as needed for itching. ?What changed: how much to take ?  ?clonazePAM 0.25 MG disintegrating tablet ?Commonly known as: KLONOPIN ?Take 1 tablet (0.25 mg total) by mouth 2 (two) times daily. ?  ?diclofenac Sodium 1 % Gel ?Commonly known as: VOLTAREN ?Apply 2 g topically 4 (four) times daily. ?  ?divalproex 250 MG DR tablet ?Commonly known as: DEPAKOTE ?Take 1,250 mg by mouth at bedtime. ?  ?feeding supplement (GLUCERNA SHAKE) Liqd ?Take 237 mLs by mouth 3 (three) times daily between meals. ?  ?gabapentin 100 MG capsule ?Commonly known as: NEURONTIN ?Take 2 capsules (200 mg total) by mouth 3 (three) times daily. ?  ?Glycerin-Hypromellose-PEG 400 0.2-0.2-1 % Soln ?Place 1 drop into both eyes in the morning, at noon, and at bedtime. ?  ?guaiFENesin 600 MG 12 hr tablet ?Commonly known as: [place] ?Take 1 tablet (600 mg total) by mouth 2 (two) times daily as needed for up to  15 days for cough. ?  ?insulin glargine-yfgn 100 UNIT/ML injection ?Commonly known as: SEMGLEE ?Inject 0.15 mLs (15 Units total) into the skin 2 (two) times daily. ?What changed: how much to take ?  ?insulin lispro 100 UNIT/ML injection ?Commonly known as: HUMALOG ?Inject 2-5 Units into the skin 3 (three) times daily before meals. Per sliding scale ?  ?liver oil-zinc oxide 40 % ointment ?Commonly known as: DESITIN ?Apply topically as needed for irritation. ?What changed: how much to take ?  ?magnesium oxide 400 (240 Mg) MG tablet ?Commonly known as: MAG-OX ?Take 1 tablet (400 mg total) by mouth daily. ?  ?melatonin 3 MG Tabs tablet ?Take 2 tablets (6 mg total) by mouth at bedtime. ?  ?multivitamin with minerals Tabs tablet ?Take 1 tablet by mouth daily. ?  ?OLANZapine 7.5 MG  tablet ?Commonly known as: ZYPREXA ?Take 1 tablet (7.5 mg total) by mouth at bedtime. ?  ?Pancrelipase (Lip-Prot-Amyl) 24000-76000 units Cpep ?Take 3 capsules (72,000 Units total) by mouth 3 (three) times daily. ?  ?p

## 2021-05-23 NOTE — Care Management Important Message (Signed)
Important Message ? ?Patient Details  ?Name: Brittany [name] ?MRN: [id number] ?Date of Birth: [birth year] ? ? ?Medicare Important Message Given:  Yes ? ? ? ? ?Shelda Altes ?05/23/2021, 11:54 AM ?

## 2021-05-23 NOTE — TOC Transition Note (Signed)
Transition of Care (TOC) - CM/SW Discharge Note ? ? ?Patient Details  ?Name: Brittany [name] ?MRN: [id number] ?Date of Birth: 1960/05/18 ? ?Transition of Care [facility]) CM/SW Contact:  ?Geralynn Ochs, LCSW ?Phone Number: ?05/23/2021, 10:33 AM ? ? ?Clinical Narrative:   CSW updated by PT yesterday that patient was able to ambulate, and MD spoke with daughter who wanted to take the patient home. CSW spoke with daughter to discuss discharge home, and daughter is in agreement. CSW discussed recommendation for home health, and patient has had [facility] in the past. CSW spoke with [facility] to provide referral, they accepted for PT, OT, and aide. Daughter requested that medications be sent to [facility] in [place], [place] notified MD. Family to provide transport home.  ? ?CSW also contacted APS to update about plan to go home, and APS is planning to file for guardianship over patient. CSW updated Annie Main [phone]) about patient going home with daughter today, and home health setup. APS appreciative of update and will follow up for safety at home. ? ? ? ?Final next level of care: [place] ?Barriers to Discharge: Barriers Resolved ? ? ?Patient Goals and CMS Choice ?Patient states their goals for this hospitalization and ongoing recovery are:: patient unable to participate in goal setting, not oriented ?CMS Medicare.gov Compare Post Acute Care list provided to:: Patient Represenative (must comment) ?Choice offered to / list presented to : Adult Children ? ?Discharge Placement ?  ?           ?  ?Patient to be transferred to facility by: Family ?Name of family member notified: [name] ?Patient and family notified of of transfer: 05/23/21 ? ?Discharge Plan and Services ?  ?  ?Post Acute Care Choice: [place]          ?  ?  ?  ?  ?  ?HH Arranged: PT, OT, Nurse's Aide ?[place] Agency: [place] ?Date HH Agency Contacted: 05/23/21 ?  ?Representative spoke with at [place]:  [name] ? ?Social Determinants of Health (SDOH) Interventions ?  ? ? ?Readmission Risk Interventions ? ?  03/06/2021  ?  9:13 AM 02/16/2021  ?  2:45 PM  ?Readmission Risk Prevention Plan  ?Transportation Screening Complete Complete  ?Medication Review Press photographer) Complete Complete  ?PCP or Specialist appointment within 3-5 days of discharge Complete   ?SW Recovery Care/Counseling Consult  Complete  ?Palliative Care Screening Not Applicable Not Applicable  ?[place] Not Applicable Not Applicable  ? ? ? ? ? ?

## 2021-05-23 NOTE — Progress Notes (Signed)
Nutrition Follow-up ? ?DOCUMENTATION CODES:  ?Severe malnutrition in context of chronic illness ? ?INTERVENTION:  ? ?Recommend consult to Diabetes Coordinator given poorly controlled CBGs ? ?-continue Glucerna Shake po TID, each supplement provides 220 kcal and 10 grams of protein ?-continue MVI with minerals daily ? ?NUTRITION DIAGNOSIS:  ?Severe Malnutrition related to chronic illness (cancer, lupus) as evidenced by severe muscle depletion, severe fat depletion. -- ongoing  ? ?GOAL:  ?Patient will meet greater than or equal to 90% of their needs -- progressing ? ?MONITOR:  ?PO intake, Supplement acceptance, Labs, Weight trends, I & O's, Skin ? ?REASON FOR ASSESSMENT:  ?Consult ?Assessment of nutrition requirement/status ? ?ASSESSMENT:  ?Pt with PMH significant for HTN, type 2 DM, CVA w/ R-sided hemiparesis, COPD, h/o diverticulitis w/ abscess s/p colostomy, anxiety/depression, discoid lupus, HLD, s/p pancreatectomy/splenectomy, recent hospitalization from 2/22-3/28 for DKA, seizure disorder, and COVID-19 infection admitted with AMS and multifocal PNA. Pt with PMH significant for HTN, type 2 DM, CVA w/ R-sided hemiparesis, COPD, h/o diverticulitis w/ abscess s/p colostomy, anxiety/depression, discoid lupus, HLD, s/p pancreatectomy/splenectomy, recent hospitalization from 2/22-3/28 for DKA, seizure disorder, and COVID-19 infection admitted with AMS and multifocal PNA. ? ?Pt has completed tx for PNA and is w/o complaints at this time. SLP following; pt was advanced to dysphagia 3 diet with thin liquids yesterday and PO intake has significantly improved as a result. 0-100% PO Intake x last 8 recorded meals (34% avg meal intake); however, pt with 75-100% meal completion with diet advancement. Per RN, pt doing well with ONS. Recommend continue current nutrition plan of care.  ? ?UOP: 1224m x24 hours ?Colostomy: 12515mx24 hours ?I/O: -[id number] admit ? ?Medications: ? docusate sodium  100 mg Oral BID  ? feeding  supplement (GLUCERNA SHAKE)  237 mL Oral TID BM  ? insulin aspart  0-5 Units Subcutaneous QHS  ? insulin aspart  0-9 Units Subcutaneous TID WC  ? insulin glargine-yfgn  24 Units Subcutaneous QHS  ? lipase/protease/amylase  72,000 Units Oral TID WC  ? magnesium oxide  400 mg Oral BID  ? multivitamin with minerals  1 tablet Oral Daily  ? ?Labs: ?Recent Labs  ?Lab 05/17/21 ?0354 05/18/21 ?0153 05/19/21 ?0352671/08/23 ?0154 05/21/21 ?0406 05/22/21 ?0352458NA 136   < > 135 140 141 135  ?K 3.0*   < > 3.9 4.0 3.4* 4.9  ?CL 102   < > 102 101 104 102  ?CO2 28   < > 24 30 32 27  ?BUN 11   < > 7* 5* 10 12  ?CREATININE 0.76   < > 0.61 0.55 0.55 0.64  ?CALCIUM 8.4*   < > 9.2 8.9 8.8* 9.0  ?MG 1.4*  --  1.5* 1.7 1.8 1.6*  ?PHOS 4.3  --  3.0  --   --   --   ?GLUCOSE 120*   < > 304* 178* 45* 267*  ? < > = values in this interval not displayed.  ?CBGs: 41-435 x24 hours ?Hgb a1c (04/06/21): 15.0 ? ?Diet Order:   ?Diet Order   ? ?       ?  Diet general       ?  ?  DIET DYS 3 Room service appropriate? Yes with Assist; Fluid consistency: Thin  Diet effective now       ?  ? ?  ?  ? ?  ? ?EDUCATION NEEDS:  ?Not appropriate for education at this time ? ?Skin:  Skin Assessment: Reviewed RN Assessment ?  Skin Integrity Issues:: Other (Comment) ?Incisions: n/a ?Other: n/a ? ?Last BM:  4/10 via colostomy ? ?Height:  ?Ht Readings from Last 1 Encounters:  ?05/15/21 5' (1.524 m)  ? ?Weight:  ?Wt Readings from Last 1 Encounters:  ?05/15/21 44.1 kg  ? ?BMI:  Body mass index is 18.99 kg/m?. ? ?Estimated Nutritional Needs:  ?Kcal:  1400-1600 ?Protein:  70-80 grams ?Fluid:  >1.4L ? ? ?Theone Stanley., MS, RD, LDN (she/her/hers) ?RD pager number and weekend/on-call pager number located in [place]. ?

## 2021-05-23 NOTE — Progress Notes (Signed)
I have attempted to call patients daughter to go over AVS and she does not answer the phone, no voicemail available either.  AVS reviewed with Shea Stakes (nephew) at bedside. ?

## 2021-05-23 NOTE — Plan of Care (Signed)
?  Problem: Activity: ?Goal: Ability to tolerate increased activity will improve ?Outcome: Adequate for Discharge ?  ?Problem: Clinical Measurements: ?Goal: Ability to maintain a body temperature in the normal range will improve ?Outcome: Adequate for Discharge ?  ?Problem: Respiratory: ?Goal: Ability to maintain adequate ventilation will improve ?Outcome: Adequate for Discharge ?Goal: Ability to maintain a clear airway will improve ?Outcome: Adequate for Discharge ?  ?Problem: Education: ?Goal: Knowledge of General Education information will improve ?Description: Including pain rating scale, medication(s)/side effects and non-pharmacologic comfort measures ?Outcome: Adequate for Discharge ?  ?Problem: Health Behavior/Discharge Planning: ?Goal: Ability to manage health-related needs will improve ?Outcome: Adequate for Discharge ?  ?Problem: Clinical Measurements: ?Goal: Ability to maintain clinical measurements within normal limits will improve ?Outcome: Adequate for Discharge ?Goal: Will remain free from infection ?Outcome: Adequate for Discharge ?Goal: Diagnostic test results will improve ?Outcome: Adequate for Discharge ?Goal: Respiratory complications will improve ?Outcome: Adequate for Discharge ?Goal: Cardiovascular complication will be avoided ?Outcome: Adequate for Discharge ?  ?Problem: Activity: ?Goal: Risk for activity intolerance will decrease ?Outcome: Adequate for Discharge ?  ?Problem: Nutrition: ?Goal: Adequate nutrition will be maintained ?Outcome: Adequate for Discharge ?  ?Problem: Coping: ?Goal: Level of anxiety will decrease ?Outcome: Adequate for Discharge ?  ?Problem: Elimination: ?Goal: Will not experience complications related to bowel motility ?Outcome: Adequate for Discharge ?Goal: Will not experience complications related to urinary retention ?Outcome: Adequate for Discharge ?  ?Problem: Pain Managment: ?Goal: General experience of comfort will improve ?Outcome: Adequate for Discharge ?   ?Problem: Safety: ?Goal: Ability to remain free from injury will improve ?Outcome: Adequate for Discharge ?  ?Problem: Skin Integrity: ?Goal: Risk for impaired skin integrity will decrease ?Outcome: Adequate for Discharge ?  ?Problem: Safety: ?Goal: Non-violent Restraint(s) ?Outcome: Adequate for Discharge ?  ?

## 2021-05-23 NOTE — Progress Notes (Signed)
Occupational Therapy Treatment ?Patient Details ?Name: Brittany [name] ?MRN: [id number] ?DOB: June 05, 1960 ?Today's Date: 05/23/2021 ? ? ?History of present illness Pt. is a 61 y.o. F presenting to [facility] on 4/3 for AMS from SNF secondary to PNA and sepsis. She was recently d/c on 3/28 for new-onset of seizure with sepsis due to aspiration PNA and DKA, COVID+. 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 ?  ?OT comments ? Patient received sitting on EOB eating completing breakfast and agreeable to OT session. Patient was min assist to stand and step pivot into recliner with Zeigler assist. Self care tasks performed while seated in recliner. Patient made attempts to stand on her own from EOB before chair was positioned and required verbal cues to wait for assistance. Patient could benefit from further OT services to increase independence and safety with self care. Current recommendations for SNF but daughter is willing to take her home if she is ambulatory. Acute OT to continue to follow.   ? ?Recommendations for follow up therapy are one component of a multi-disciplinary discharge planning process, led by the attending physician.  Recommendations may be updated based on patient status, additional functional criteria and insurance authorization. ?   ?Follow Up Recommendations ? Skilled nursing-short term rehab (<3 hours/day)  ?  ?Assistance Recommended at Discharge Frequent or constant Supervision/Assistance  ?Patient can return home with the following ? A lot of help with walking and/or transfers;A lot of help with bathing/dressing/bathroom;Assistance with cooking/housework;Direct supervision/assist for medications management;Direct supervision/assist for financial management;Assist for transportation;Help with stairs or ramp for entrance ?  ?Equipment Recommendations ? None  recommended by OT  ?  ?Recommendations for Other Services   ? ?  ?Precautions / Restrictions Precautions ?Precautions: Fall ?Precaution Comments: seizure, urinary incontinence, colostomy bag frequently comes off ?Restrictions ?Weight Bearing Restrictions: No  ? ? ?  ? ?Mobility Bed Mobility ?Overal bed mobility: Needs Assistance ?  ?  ?  ?  ?  ?  ?General bed mobility comments: seated on EOB upon arrival and in recliner at end of session ?  ? ?Transfers ?Overall transfer level: Needs assistance ?Equipment used: 1 person hand held assist ?Transfers: Sit to/from Stand ?Sit to Stand: Min assist ?  ?  ?Step pivot transfers: Min assist ?  ?  ?General transfer comment: transfer from EOB to recliner ?  ?  ?Balance Overall balance assessment: Needs assistance ?Sitting-balance support: No upper extremity supported, Feet supported ?Sitting balance-Leahy Scale: Good ?Sitting balance - Comments: able to eat breakfast seated on EOB ?  ?Standing balance support: Bilateral upper extremity supported, During functional activity ?Standing balance-Leahy Scale: Poor ?Standing balance comment: requires UE support for balance ?  ?  ?  ?  ?  ?  ?  ?  ?  ?  ?  ?   ? ?ADL either performed or assessed with clinical judgement  ? ?ADL Overall ADL's : Needs assistance/impaired ?  ?  ?Grooming: Wash/dry hands;Wash/dry face;Set up;Sitting ?Grooming Details (indicate cue type and reason): from EOB ?Upper Body Bathing: Minimal assistance;Sitting ?  ?  ?  ?Upper Body Dressing : Minimal assistance;Sitting ?Upper Body Dressing Details (indicate cue type and reason): changed gown ?  ?  ?Toilet Transfer: Minimal assistance;Stand-pivot ?Toilet Transfer Details (indicate cue type and reason): simulated to recliner ?  ?  ?  ?  ?  ?General ADL Comments: sitting on EOB completing breakfast. Grooming performed  seated on EOB and UB bathing and gown changed from recliner ?  ? ?Extremity/Trunk Assessment Upper Extremity Assessment ?RUE Deficits / Details: WFL  ROM (less shoulder ROM than left), 4-/5 shoulder strength, elbow strength 4/5, wrist 5/5, grip 4/5 - using functionally for self feeding and grooming ?RUE Sensation: decreased light touch ?RUE Coordination: decreased fine motor;decreased gross motor ?LUE Deficits / Details: WFL ROM, 5/5 strength ?LUE Sensation: WNL ?LUE Coordination: WNL ?  ?  ?  ?  ?  ? ?Vision   ?  ?  ?Perception   ?  ?Praxis   ?  ? ?Cognition Arousal/Alertness: Awake/alert ?Behavior During Therapy: Flat affect ?Overall Cognitive Status: Impaired/Different from baseline ?Area of Impairment: Following commands, Safety/judgement, Awareness, Problem solving, Attention ?  ?  ?  ?  ?  ?  ?  ?  ?Orientation Level: Disoriented to, Time, Place ?Current Attention Level: Selective ?Memory: Decreased short-term memory, Decreased recall of precautions ?Following Commands: Follows one step commands with increased time, Follows multi-step commands inconsistently ?Safety/Judgement: Decreased awareness of safety, Decreased awareness of deficits ?Awareness: Emergent ?Problem Solving: Requires verbal cues, Requires tactile cues, Slow processing ?General Comments: pleasant mood, attempted to stand from EOB and required verbal cues to wait for assistance ?  ?  ?   ?Exercises   ? ?  ?Shoulder Instructions   ? ? ?  ?General Comments    ? ? ?Pertinent Vitals/ Pain       Pain Assessment ?Pain Assessment: No/denies pain ?Pain Intervention(s): Monitored during session ? ?Home Living   ?  ?  ?  ?  ?  ?  ?  ?  ?  ?  ?  ?  ?  ?  ?  ?  ?  ?  ? ?  ?Prior Functioning/Environment    ?  ?  ?  ?   ? ?Frequency ? Min 2X/week  ? ? ? ? ?  ?Progress Toward Goals ? ?OT Goals(current goals can now be found in the care plan section) ? Progress towards OT goals: Progressing toward goals ? ?Acute Rehab OT Goals ?Patient Stated Goal: get better ?OT Goal Formulation: With patient ?Time For Goal Achievement: 05/31/21 ?Potential to Achieve Goals: Good ?ADL Goals ?Pt Will Perform Lower Body  Bathing: with modified independence;sitting/lateral leans;sit to/from stand ?Pt Will Perform Upper Body Dressing: Independently;sitting ?Pt Will Perform Lower Body Dressing: with modified independence;sitting/lateral leans;sit to/from stand ?Pt Will Transfer to Toilet: with modified independence;ambulating ?Pt Will Perform Toileting - Clothing Manipulation and hygiene: with modified independence;sitting/lateral leans;sit to/from stand ?Additional ADL Goal #1: Pt will independently follow 3+ step directional tasks as a precursor to safe ADL's  ?Plan Discharge plan remains appropriate   ? ?Co-evaluation ? ? ?   ?  ?  ?  ?  ? ?  ?AM-PAC OT "6 Clicks" Daily Activity     ?Outcome Measure ? ? Help from another person eating meals?: A Little ?Help from another person taking care of personal grooming?: A Little ?Help from another person toileting, which includes using toliet, bedpan, or urinal?: A Lot ?Help from another person bathing (including washing, rinsing, drying)?: A Lot ?Help from another person to put on and taking off regular upper body clothing?: A Little ?Help from another person to put on and taking off regular lower body clothing?: A Lot ?6 Click Score: 15 ? ?  ?End of Session   ? ?OT Visit Diagnosis: Unsteadiness on feet (R26.81);Other abnormalities of gait and mobility (R26.89);Muscle weakness (generalized) (M62.81) ?  ?  Activity Tolerance Patient tolerated treatment well ?  ?Patient Left in chair;with call bell/phone within reach;with chair alarm set ?  ?Nurse Communication Mobility status ?  ? ?   ? ?Time: 0814-4818 ?OT Time Calculation (min): 27 min ? ?Charges: OT General Charges ?$OT Visit: 1 Visit ?OT Treatments ?$Self Care/Home Management : 23-37 mins ? ?Lodema Hong, OTA ?Acute Rehabilitation Services  ?Pager [phone] ?Office [phone] ? ? ?[name] ?05/23/2021, 9:49 AM ?

## 2021-05-23 NOTE — Progress Notes (Signed)
Physical Therapy Treatment ?Patient Details ?Name: Brittany [name] ?MRN: [id number] ?DOB: 1960-02-18 ?Today's Date: 05/23/2021 ? ? ?History of Present Illness Pt. is a 61 y.o. F presenting to [facility] on 4/3 for AMS from SNF secondary to PNA and sepsis. She was recently d/c on 3/28 for new-onset of seizure with sepsis due to aspiration PNA and DKA, COVID+. 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 ? ?  ?PT Comments  ? ? Received pt semi-reclined in bed asleep; pt required increased time and moderate verbal stimuli to arouse and lethargic throughout session. Pt transferred semi-reclined<>sitting EOB with min HHA and requested to eat breakfast. Planned to ambulate to recliner, but upon standing Purewick leaked and pt returned to sitting EOB to get cleaned up. Pt then declined ambulating or getting to recliner insisting on sitting EOB to eat - provided full supervision while eating and cues pt to take small bites and chew completley, as pt attempting to put too much food into her mouth and coughing. Pt left sitting EOB with RN present attending to care. Initially recommended SNF, however current plan for pt to D/C home with daughter providing assist and HHPT. Acute PT to cont to follow.  ?   ?Recommendations for follow up therapy are one component of a multi-disciplinary discharge planning process, led by the attending physician.  Recommendations may be updated based on patient status, additional functional criteria and insurance authorization. ? ?Follow Up Recommendations ? Home health PT ?  ?  ?Assistance Recommended at Discharge Frequent or constant Supervision/Assistance  ?Patient can return home with the following Assistance with cooking/housework;Direct supervision/assist for medications management;Help with stairs or ramp for entrance;Assist for transportation;A lot of  help with walking and/or transfers;A lot of help with bathing/dressing/bathroom;Assistance with feeding;Direct supervision/assist for financial management ?  ?Equipment Recommendations ? Rolling walker (2 wheels)  ?  ?Recommendations for Other Services   ? ? ?  ?Precautions / Restrictions Precautions ?Precautions: Fall ?Precaution Comments: seizure, urinary incontinence, colostomy bag frequently comes off ?Restrictions ?Weight Bearing Restrictions: No  ?  ? ?Mobility ? Bed Mobility ?Overal bed mobility: Needs Assistance ?Bed Mobility: Rolling, Supine to Sit ?Rolling: Supervision ?  ?Supine to sit: Min assist ?  ?  ?General bed mobility comments: pt required min HHA to transition from semi-reclined<>sitting EOB ?Patient Response: Impulsive, Flat affect (mild) ? ?Transfers ?Overall transfer level: Needs assistance ?Equipment used: 1 person hand held assist ?Transfers: Sit to/from Stand ?Sit to Stand: Min assist ?  ?  ?  ?  ?  ?General transfer comment: pt stood from EOB but Purewick not properly in place and pt with episode of urnary incontinence - returned to sitting EOB to clean ?  ? ?Ambulation/Gait ?  ?  ?  ?  ?  ?  ?  ?  ? ? ?Stairs ?  ?  ?  ?  ?  ? ? ?Wheelchair Mobility ?  ? ?Modified Rankin (Stroke Patients Only) ?  ? ? ?  ?Balance Overall balance assessment: Needs assistance ?Sitting-balance support: No upper extremity supported, Feet supported ?Sitting balance-Leahy Scale: Good ?Sitting balance - Comments: left sitting EOB with RN present at bedside ?  ?Standing balance support: During functional activity, Single extremity supported ?Standing balance-Leahy Scale: Poor ?Standing balance comment: reliance on UE support for balance. Able to maintain static standing balance with min A ?  ?  ?  ?  ?  ?  ?  ?  ?  ?  ?  ?  ? ?  ?  Cognition Arousal/Alertness: Lethargic (just woke) ?Behavior During Therapy: Flat affect ?Overall Cognitive Status: Impaired/Different from baseline ?Area of Impairment: Following commands,  Safety/judgement, Awareness, Problem solving, Attention ?  ?  ?  ?  ?  ?  ?  ?  ?  ?  ?  ?  ?Safety/Judgement: Decreased awareness of safety, Decreased awareness of deficits ?  ?Problem Solving: Requires verbal cues, Requires tactile cues, Slow processing ?General Comments: pt required increased time to arouse. mild impulsivity noted ?  ?  ? ?  ?Exercises   ? ?  ?General Comments General comments (skin integrity, edema, etc.): pt initally attempting to stand from EOB before therapist was ready; requiring cues to wait for therapist. ?  ?  ? ?Pertinent Vitals/Pain Pain Assessment ?Pain Assessment: No/denies pain  ? ? ?Home Living   ?  ?  ?  ?  ?  ?  ?  ?  ?  ?   ?  ?Prior Function    ?  ?  ?   ? ?PT Goals (current goals can now be found in the care plan section) Acute Rehab PT Goals ?Patient Stated Goal: To improve her strength and return home. ?PT Goal Formulation: With patient ?Time For Goal Achievement: 05/31/21 ?Potential to Achieve Goals: Fair ?Progress towards PT goals: Progressing toward goals ? ?  ?Frequency ? ? ? Min 3X/week ? ? ? ?  ?PT Plan Discharge plan needs to be updated  ? ? ?Co-evaluation   ?  ?  ?  ?  ? ?  ?AM-PAC PT "6 Clicks" Mobility   ?Outcome Measure ? Help needed turning from your back to your side while in a flat bed without using bedrails?: A Little ?Help needed moving from lying on your back to sitting on the side of a flat bed without using bedrails?: A Little ?Help needed moving to and from a bed to a chair (including a wheelchair)?: A Little ?Help needed standing up from a chair using your arms (e.g., wheelchair or bedside chair)?: A Little ?Help needed to walk in hospital room?: A Lot ?Help needed climbing 3-5 steps with a railing? : Total ?6 Click Score: 15 ? ?  ?End of Session Equipment Utilized During Treatment: Gait belt ?Activity Tolerance: Patient tolerated treatment well ?Patient left: with nursing/sitter in room;in bed;with call bell/phone within reach ?Nurse Communication:  Mobility status ?PT Visit Diagnosis: Other abnormalities of gait and mobility (R26.89);Muscle weakness (generalized) (M62.81);Other symptoms and signs involving the nervous system (R29.898) ?  ? ? ?Time: 2094-7096 ?PT Time Calculation (min) (ACUTE ONLY): 18 min ? ?Charges:  $Therapeutic Activity: 8-22 mins          ?          ?Becky Sax PT, DPT  ?Blenda Nicely ?05/23/2021, 11:26 AM ? ?

## 2021-05-23 NOTE — Progress Notes (Signed)
Inpatient Diabetes Program Recommendations ? ?AACE/ADA: New Consensus Statement on Inpatient Glycemic Control (2015) ? ?Target Ranges:  Prepandial:   less than 140 mg/dL ?     Peak postprandial:   less than 180 mg/dL (1-2 hours) ?     Critically ill patients:  140 - 180 mg/dL  ? ?Lab Results  ?Component Value Date  ? GLUCAP 127 (H) 05/23/2021  ? HGBA1C 15.0 (H) 04/06/2021  ? ? ?Review of Glycemic Control ? Latest Reference Range & Units 05/22/21 10:21 05/22/21 11:21 05/22/21 16:03 05/22/21 21:09 05/22/21 22:20 05/23/21 06:12 05/23/21 06:49  ?Glucose-Capillary 70 - 99 mg/dL 179 (H) 164 (H) 169 (H) 435 (H) 379 (H) 41 (LL) 127 (H)  ?(LL): Data is critically low ?(H): Data is abnormally high ? ?Diabetes history: DM2 ?Outpatient Diabetes medications: Humalog 2-5 TID, Semglee 20 BID, Novolog 0-5 ?Current orders for Inpatient glycemic control: Semglee 24 units qhs, Novolog 0-9 units tid, 0-5 units hs correction ? ?Inpatient Diabetes Program Recommendations:   ?Patient had hypoglycemia this am of 41 after received Novolog 5 units @ hs along with Semglee 24 units ?-Decrease Semglee to 22 units q hs ?-Add Novolog 2 units tid meal coverage if eats 50% ?Secure chat sent to Dr. Louanne Belton. ? ?Thank you, ?Nani Gasser Naylene Foell, RN, MSN, CDE  ?Diabetes Coordinator ?Inpatient Glycemic Control Team ?Team Pager [phone] (8am-5pm) ?05/23/2021 10:42 AM ? ? ? ? ? ?

## 2021-06-25 ENCOUNTER — Emergency Department: Payer: Medicare Other

## 2021-06-25 ENCOUNTER — Inpatient Hospital Stay
Admission: EM | Admit: 2021-06-25 | Discharge: 2021-06-27 | DRG: 638 | Disposition: A | Discharge status: Home Health | Payer: Medicare Other | Attending: Hospitalist | Admitting: Hospitalist

## 2021-06-25 DIAGNOSIS — R739 Hyperglycemia, unspecified: Secondary | ICD-10-CM

## 2021-06-25 DIAGNOSIS — E872 Acidosis, unspecified: Secondary | ICD-10-CM | POA: Diagnosis present

## 2021-06-25 DIAGNOSIS — I10 Essential (primary) hypertension: Secondary | ICD-10-CM | POA: Diagnosis present

## 2021-06-25 DIAGNOSIS — E1100 Type 2 diabetes mellitus with hyperosmolarity without nonketotic hyperglycemic-hyperosmolar coma (NKHHC): Principal | ICD-10-CM | POA: Diagnosis present

## 2021-06-25 DIAGNOSIS — Z79899 Other long term (current) drug therapy: Secondary | ICD-10-CM | POA: Diagnosis not present

## 2021-06-25 DIAGNOSIS — Z90410 Acquired total absence of pancreas: Secondary | ICD-10-CM

## 2021-06-25 DIAGNOSIS — R4182 Altered mental status, unspecified: Principal | ICD-10-CM

## 2021-06-25 DIAGNOSIS — Z7982 Long term (current) use of aspirin: Secondary | ICD-10-CM | POA: Diagnosis not present

## 2021-06-25 DIAGNOSIS — Z91128 Patient's intentional underdosing of medication regimen for other reason: Secondary | ICD-10-CM | POA: Diagnosis not present

## 2021-06-25 DIAGNOSIS — E785 Hyperlipidemia, unspecified: Secondary | ICD-10-CM | POA: Diagnosis present

## 2021-06-25 DIAGNOSIS — Z20822 Contact with and (suspected) exposure to covid-19: Secondary | ICD-10-CM | POA: Diagnosis present

## 2021-06-25 DIAGNOSIS — I69391 Dysphagia following cerebral infarction: Secondary | ICD-10-CM | POA: Diagnosis not present

## 2021-06-25 DIAGNOSIS — G2581 Restless legs syndrome: Secondary | ICD-10-CM | POA: Diagnosis present

## 2021-06-25 DIAGNOSIS — G40909 Epilepsy, unspecified, not intractable, without status epilepticus: Secondary | ICD-10-CM | POA: Diagnosis present

## 2021-06-25 DIAGNOSIS — E875 Hyperkalemia: Secondary | ICD-10-CM | POA: Diagnosis present

## 2021-06-25 DIAGNOSIS — E119 Type 2 diabetes mellitus without complications: Secondary | ICD-10-CM

## 2021-06-25 DIAGNOSIS — Z9081 Acquired absence of spleen: Secondary | ICD-10-CM

## 2021-06-25 DIAGNOSIS — Z8507 Personal history of malignant neoplasm of pancreas: Secondary | ICD-10-CM | POA: Diagnosis not present

## 2021-06-25 DIAGNOSIS — J439 Emphysema, unspecified: Secondary | ICD-10-CM | POA: Diagnosis present

## 2021-06-25 DIAGNOSIS — Z794 Long term (current) use of insulin: Secondary | ICD-10-CM | POA: Diagnosis not present

## 2021-06-25 DIAGNOSIS — T383X6A Underdosing of insulin and oral hypoglycemic [antidiabetic] drugs, initial encounter: Secondary | ICD-10-CM | POA: Diagnosis present

## 2021-06-25 DIAGNOSIS — R1310 Dysphagia, unspecified: Secondary | ICD-10-CM | POA: Diagnosis present

## 2021-06-25 DIAGNOSIS — Z79890 Hormone replacement therapy (postmenopausal): Secondary | ICD-10-CM | POA: Diagnosis not present

## 2021-06-25 DIAGNOSIS — R569 Unspecified convulsions: Secondary | ICD-10-CM | POA: Diagnosis present

## 2021-06-25 DIAGNOSIS — I69351 Hemiplegia and hemiparesis following cerebral infarction affecting right dominant side: Secondary | ICD-10-CM | POA: Diagnosis not present

## 2021-06-25 DIAGNOSIS — Z91148 Patient's other noncompliance with medication regimen for other reason: Secondary | ICD-10-CM

## 2021-06-25 DIAGNOSIS — Z933 Colostomy status: Secondary | ICD-10-CM | POA: Diagnosis not present

## 2021-06-25 DIAGNOSIS — Z91119 Patient's noncompliance with dietary regimen due to unspecified reason: Secondary | ICD-10-CM

## 2021-06-25 LAB — BLOOD GAS, VENOUS
Acid-base deficit: 0.7 mmol/L (ref 0.0–2.0)
Bicarbonate: 26.6 mmol/L (ref 20.0–28.0)
Patient temperature: 37
pCO2, Ven: 54 mmHg (ref 44–60)
pH, Ven: 7.3 (ref 7.25–7.43)

## 2021-06-25 LAB — CBC WITH DIFFERENTIAL/PLATELET
Abs Immature Granulocytes: 0.01 10*3/uL (ref 0.00–0.07)
Basophils Absolute: 0 10*3/uL (ref 0.0–0.1)
Basophils Relative: 0 %
Eosinophils Absolute: 0.2 10*3/uL (ref 0.0–0.5)
Eosinophils Relative: 2 %
HCT: 39.9 % (ref 36.0–46.0)
Hemoglobin: 12.3 g/dL (ref 12.0–15.0)
Immature Granulocytes: 0 %
Lymphocytes Relative: 36 %
Lymphs Abs: 2.2 10*3/uL (ref 0.7–4.0)
MCH: 27.7 pg (ref 26.0–34.0)
MCHC: 30.8 g/dL (ref 30.0–36.0)
MCV: 89.9 fL (ref 80.0–100.0)
Monocytes Absolute: 0.3 10*3/uL (ref 0.1–1.0)
Monocytes Relative: 4 %
Neutro Abs: 3.6 10*3/uL (ref 1.7–7.7)
Neutrophils Relative %: 58 %
Platelets: 404 10*3/uL — ABNORMAL HIGH (ref 150–400)
RBC: 4.44 MIL/uL (ref 3.87–5.11)
RDW: 15.8 % — ABNORMAL HIGH (ref 11.5–15.5)
WBC: 6.3 10*3/uL (ref 4.0–10.5)
nRBC: 0 % (ref 0.0–0.2)

## 2021-06-25 LAB — LACTIC ACID, PLASMA
Lactic Acid, Venous: 2 mmol/L (ref 0.5–1.9)
Lactic Acid, Venous: 3.9 mmol/L (ref 0.5–1.9)

## 2021-06-25 LAB — COMPREHENSIVE METABOLIC PANEL
ALT: 48 U/L — ABNORMAL HIGH (ref 0–44)
AST: 33 U/L (ref 15–41)
Albumin: 3 g/dL — ABNORMAL LOW (ref 3.5–5.0)
Alkaline Phosphatase: 194 U/L — ABNORMAL HIGH (ref 38–126)
Anion gap: 6 (ref 5–15)
BUN: 18 mg/dL (ref 8–23)
CO2: 26 mmol/L (ref 22–32)
Calcium: 9.7 mg/dL (ref 8.9–10.3)
Chloride: 92 mmol/L — ABNORMAL LOW (ref 98–111)
Creatinine, Ser: 0.97 mg/dL (ref 0.44–1.00)
GFR, Estimated: >60 mL/min (ref >60)
Glucose, Bld: 882 mg/dL (ref 70–99)
Potassium: 6.6 mmol/L (ref 3.5–5.1)
Sodium: 124 mmol/L — ABNORMAL LOW (ref 135–145)
Total Bilirubin: 0.3 mg/dL (ref 0.3–1.2)
Total Protein: 8.5 g/dL — ABNORMAL HIGH (ref 6.5–8.1)

## 2021-06-25 LAB — URINALYSIS, COMPLETE (UACMP) WITH MICROSCOPIC
Bacteria, UA: NONE SEEN
Bilirubin Urine: NEGATIVE
Glucose, UA: >=500 mg/dL — AB
Hgb urine dipstick: NEGATIVE
Ketones, ur: NEGATIVE mg/dL
Leukocytes,Ua: NEGATIVE
Nitrite: NEGATIVE
Protein, ur: NEGATIVE mg/dL
Specific Gravity, Urine: 1.023 (ref 1.005–1.030)
Squamous Epithelial / HPF: NONE SEEN (ref 0–5)
pH: 5 (ref 5.0–8.0)

## 2021-06-25 LAB — GLUCOSE, CAPILLARY
Glucose-Capillary: 303 mg/dL — ABNORMAL HIGH (ref 70–99)
Glucose-Capillary: 370 mg/dL — ABNORMAL HIGH (ref 70–99)
Glucose-Capillary: 254 mg/dL — ABNORMAL HIGH (ref 70–99)
Glucose-Capillary: 211 mg/dL — ABNORMAL HIGH (ref 70–99)

## 2021-06-25 LAB — RESP PANEL BY RT-PCR (FLU A&B, COVID) ARPGX2
Influenza A by PCR: NEGATIVE
Influenza B by PCR: NEGATIVE
SARS Coronavirus 2 by RT PCR: NEGATIVE

## 2021-06-25 LAB — TSH: TSH: 0.992 u[IU]/mL (ref 0.350–4.500)

## 2021-06-25 LAB — TROPONIN I (HIGH SENSITIVITY)
Troponin I (High Sensitivity): 8 ng/L (ref <18)
Troponin I (High Sensitivity): 9 ng/L (ref <18)

## 2021-06-25 LAB — CBG MONITORING, ED
Glucose-Capillary: >600 mg/dL (ref 70–99)
Glucose-Capillary: >600 mg/dL (ref 70–99)
Glucose-Capillary: 575 mg/dL (ref 70–99)
Glucose-Capillary: 495 mg/dL — ABNORMAL HIGH (ref 70–99)

## 2021-06-25 LAB — BETA-HYDROXYBUTYRIC ACID: Beta-Hydroxybutyric Acid: 0.3 mmol/L — ABNORMAL HIGH (ref 0.05–0.27)

## 2021-06-25 LAB — AMMONIA: Ammonia: 10 umol/L (ref 9–35)

## 2021-06-25 LAB — PROCALCITONIN: Procalcitonin: <0.1 ng/mL

## 2021-06-25 LAB — ETHANOL: Alcohol, Ethyl (B): <10 mg/dL (ref <10)

## 2021-06-25 MED ORDER — INSULIN ASPART 100 UNIT/ML IJ SOLN: 5.0000 [IU] | Freq: Once | INTRAMUSCULAR | Status: DC

## 2021-06-25 MED ORDER — ACETAMINOPHEN 500 MG PO TABS: 1000.0000 mg | ORAL_TABLET | Freq: Three times a day (TID) | ORAL | Status: DC | PRN

## 2021-06-25 MED ORDER — DEXTROSE IN LACTATED RINGERS 5 % IV SOLN: INTRAVENOUS | Status: DC

## 2021-06-25 MED ORDER — ASPIRIN EC 81 MG PO TBEC
81.0000 mg | DELAYED_RELEASE_TABLET | Freq: Every day | ORAL | Status: DC
  Administered 2021-06-26 – 2021-06-27 (×2): 81 mg via ORAL
  Filled 2021-06-25 (×2): qty 1

## 2021-06-25 MED ORDER — ONDANSETRON 4 MG PO TBDP
4.0000 mg | ORAL_TABLET | Freq: Three times a day (TID) | ORAL | Status: DC | PRN
  Filled 2021-06-25: qty 1

## 2021-06-25 MED ORDER — LACTATED RINGERS IV BOLUS: 1000.0000 mL | Freq: Once | INTRAVENOUS | Status: DC

## 2021-06-25 MED ORDER — INSULIN REGULAR(HUMAN) IN NACL 100-0.9 UT/100ML-% IV SOLN
INTRAVENOUS | Status: DC
  Administered 2021-06-25: 7 [IU]/h via INTRAVENOUS
  Filled 2021-06-25: qty 100

## 2021-06-25 MED ORDER — ALUM & MAG HYDROXIDE-SIMETH 200-200-20 MG/5ML PO SUSP
15.0000 mL | Freq: Four times a day (QID) | ORAL | Status: DC | PRN
Start: 2021-06-25 — End: 2021-06-27

## 2021-06-25 MED ORDER — MELATONIN 5 MG PO TABS
5.0000 mg | ORAL_TABLET | Freq: Every day | ORAL | Status: DC
  Administered 2021-06-25 – 2021-06-26 (×2): 5 mg via ORAL
  Filled 2021-06-25 (×2): qty 1

## 2021-06-25 MED ORDER — DOCUSATE SODIUM 100 MG PO CAPS: 100.0000 mg | ORAL_CAPSULE | Freq: Two times a day (BID) | ORAL | Status: DC | PRN

## 2021-06-25 MED ORDER — POLYETHYLENE GLYCOL 3350 17 G PO PACK: 17.0000 g | PACK | Freq: Two times a day (BID) | ORAL | Status: DC | PRN

## 2021-06-25 MED ORDER — SODIUM ZIRCONIUM CYCLOSILICATE 10 G PO PACK
10.0000 g | PACK | Freq: Once | ORAL | Status: AC
  Administered 2021-06-25: 10 g via ORAL
  Filled 2021-06-25: qty 1

## 2021-06-25 MED ORDER — ENOXAPARIN SODIUM 30 MG/0.3ML IJ SOSY
30.0000 mg | PREFILLED_SYRINGE | INTRAMUSCULAR | Status: DC
Start: 2021-06-25 — End: 2021-06-27
  Administered 2021-06-25 – 2021-06-26 (×2): 30 mg via SUBCUTANEOUS
  Filled 2021-06-25 (×2): qty 0.3

## 2021-06-25 MED ORDER — INSULIN GLARGINE-YFGN 100 UNIT/ML ~~LOC~~ SOLN: 15.0000 [IU] | Freq: Two times a day (BID) | SUBCUTANEOUS | Status: DC

## 2021-06-25 MED ORDER — CALCIUM CARBONATE ANTACID 500 MG PO CHEW: 1.0000 | CHEWABLE_TABLET | Freq: Three times a day (TID) | ORAL | Status: DC | PRN

## 2021-06-25 MED ORDER — OLANZAPINE 7.5 MG PO TABS
7.5000 mg | ORAL_TABLET | Freq: Every day | ORAL | Status: DC
  Administered 2021-06-25 – 2021-06-26 (×2): 7.5 mg via ORAL
  Filled 2021-06-25: qty 1
  Filled 2021-06-25: qty 1.5

## 2021-06-25 MED ORDER — ENOXAPARIN SODIUM 40 MG/0.4ML IJ SOSY: 40.0000 mg | PREFILLED_SYRINGE | INTRAMUSCULAR | Status: DC

## 2021-06-25 MED ORDER — PANCRELIPASE (LIP-PROT-AMYL) 36000-114000 UNITS PO CPEP: 72000.0000 [IU] | ORAL_CAPSULE | Freq: Three times a day (TID) | ORAL | Status: DC

## 2021-06-25 MED ORDER — PANCRELIPASE (LIP-PROT-AMYL) 12000-38000 UNITS PO CPEP
72000.0000 [IU] | ORAL_CAPSULE | Freq: Three times a day (TID) | ORAL | Status: DC
  Administered 2021-06-26 – 2021-06-27 (×5): 72000 [IU] via ORAL
  Filled 2021-06-25 (×5): qty 6

## 2021-06-25 MED ORDER — ALBUTEROL SULFATE (2.5 MG/3ML) 0.083% IN NEBU
5.0000 mg | INHALATION_SOLUTION | Freq: Once | RESPIRATORY_TRACT | Status: AC
  Administered 2021-06-25: 5 mg via RESPIRATORY_TRACT
  Filled 2021-06-25: qty 6

## 2021-06-25 MED ORDER — ONDANSETRON HCL 4 MG/2ML IJ SOLN: 4.0000 mg | Freq: Four times a day (QID) | INTRAMUSCULAR | Status: DC | PRN

## 2021-06-25 MED ORDER — LACTATED RINGERS IV BOLUS
1000.0000 mL | Freq: Once | INTRAVENOUS | Status: AC
  Administered 2021-06-25: 1000 mL via INTRAVENOUS

## 2021-06-25 MED ORDER — DIVALPROEX SODIUM 500 MG PO DR TAB
1250.0000 mg | DELAYED_RELEASE_TABLET | Freq: Every day | ORAL | Status: DC
  Administered 2021-06-25: 1250 mg via ORAL
  Filled 2021-06-25: qty 1

## 2021-06-25 MED ORDER — DEXTROSE 50 % IV SOLN: 0.0000 mL | INTRAVENOUS | Status: DC | PRN

## 2021-06-25 MED ORDER — GABAPENTIN 100 MG PO CAPS
200.0000 mg | ORAL_CAPSULE | Freq: Three times a day (TID) | ORAL | Status: DC
  Administered 2021-06-26: 200 mg via ORAL
  Filled 2021-06-25: qty 2

## 2021-06-25 MED ORDER — ROPINIROLE HCL 1 MG PO TABS
1.0000 mg | ORAL_TABLET | Freq: Two times a day (BID) | ORAL | Status: DC
  Administered 2021-06-25 – 2021-06-26 (×2): 1 mg via ORAL
  Filled 2021-06-25 (×2): qty 1

## 2021-06-25 MED ORDER — CALCIUM GLUCONATE-NACL 1-0.675 GM/50ML-% IV SOLN
1.0000 g | Freq: Once | INTRAVENOUS | Status: AC
  Administered 2021-06-25: 1000 mg via INTRAVENOUS
  Filled 2021-06-25: qty 50

## 2021-06-25 MED ORDER — LACTATED RINGERS IV SOLN: INTRAVENOUS | Status: DC

## 2021-06-25 NOTE — ED Notes (Signed)
This RN and Julia,RN cleaned pt up as she was incontinent of urine. Bedding was changed and pure wick was placed on pt. ?

## 2021-06-25 NOTE — H&P (Signed)
?History and Physical  ? ? ?ISSABELLA RIX TOI:[id number] DOB: [birth year] DOA: 06/25/2021 ? ?PCP: [facility]  ?Patient coming from: home ? ?I have personally briefly reviewed patient's old medical records in [place] ? ?Chief Complaint: elevated BG ? ?HPI: DAYLAH SAYAVONG is a 61 y.o. 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 ? ?Pt was awake, however, refused to cooperate with giving history or answering questions, but just repeatedly said she wanted to go to [facility].  Did deny N/V, and abdominal pain.  Per triage note, Per pt family, pt drank full bottle of coffee creamer this morning.   ? ?ED Course: On EMS arrival she was combative, received 2.5 mg of Versed.  initial vitals: afebrile, pulse 80, BP 120/101, RR 21, sating 100% on room air.  Labs notable for normal VBG, Na 124, K+ 6.6, BG 882, normal gap and bicarb, procal neg.  Pt received Ca gluconate, Lokelma, 1L LR, started on endotool HHS protocol, before admission to stepdown unit. ? ? ?Assessment/Plan ?Principal Problem: ?  Hyperosmolar hyperglycemic state (HHS) ([place]) ? ?# HHS ?# DM2, poorly controlled ?# Insulin non-compliance ?--BG 882.  Not in DKA.  Likely due to insulin non-compliance and dietary indiscretion.  A1c has been ~15 for the past year. ?--started on insulin gtt via Endotool HHS protocol in the ED. ?Plan: ?--cont insulin gtt via Endotool HHS protocol ?--give 2nd liter of LR ?--transition to glargine 15u BID when weaned off insulin gtt ? ?# Hyperkalemia ?--temporized with Ca gluconate, Lokelma.  Should improve with insulin gtt ?Plan: ?--monitor K+ ? ?# Pseudohyponatremia ?--Na 124, corrected to 136.5 ? ?# Hx of pancreatectomy ?--cont home Creon TID with meals ? ?# Hx of seizure ?--cont home Depakote ? ?# Hx of CVA  with R hemiparesis ?--cont home ASA ?--resume home statin after discharge ? ?# RLS ?--cont home Requip ? ? ?DVT prophylaxis: Lovenox SQ ?Code Status: Full code  ?Family Communication:   ?Disposition Plan: home  ?Consults called: none ?Level of care: Progressive ? ? ?Review of Systems: As per HPI otherwise complete review of systems negative.  ? ?Past Medical History:  ?Diagnosis Date  ? Colostomy in place [facility])   ? h/o diverticulitis with abscess  ? Diabetes mellitus without complication ([place])   ? Dyslipidemia   ? Hypertension   ? Lupus ([place])   ? Pancreatic cancer ([place])   ? RLS (restless legs syndrome)   ? Seizure disorder ([place])   ? 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 ? ?Past Surgical History:  ?Procedure Laterality Date  ? COLECTOMY WITH COLOSTOMY CREATION/HARTMANN PROCEDURE N/A 03/10/2021  ? Procedure: COLECTOMY WITH COLOSTOMY CREATION/HARTMANN PROCEDURE;  Surgeon: Jules Husbands, MD;  Location: [facility] ORS;  Service: General;  Laterality: N/A;  ? PANCREATECTOMY    ? spleenectomy    ? ? ? reports that she has quit smoking. Her smoking use included cigarettes. She has been exposed to tobacco smoke. She has never used smokeless tobacco. She reports current alcohol use. She reports that she does not currently use drugs. ? ?Allergies  ?Allergen Reactions  ? Cephalosporins Itching  ?  TOLERATED ZOSYN (PIPERACILLIN) BEFORE  ? Hydromorphone Itching  ? Keflin [Cephalothin] Itching  ? Lactose Intolerance (Gi) Diarrhea  ? ? ?Family History  ?Problem Relation Age of Onset  ? Anxiety disorder  Sister   ? Breast cancer Maternal Aunt   ? ? ?Prior to Admission medications   ?Medication Sig Start Date End Date Taking? Authorizing Provider  ?albuterol (VENTOLIN HFA) 108 (90 Base) MCG/ACT inhaler Inhale 2 puffs into the lungs every 6 (six) hours as needed for wheezing or shortness of breath. 05/23/21   Pokhrel, Corrie Mckusick, MD  ?aspirin 81 MG EC tablet Take 1 tablet (81 mg total) by mouth daily. Swallow whole.  05/23/21   Pokhrel, Corrie Mckusick, MD  ?atorvastatin (LIPITOR) 40 MG tablet Take 1 tablet (40 mg total) by mouth Nightly. ?Patient taking differently: Take 40 mg by mouth at bedtime. 03/14/21 07/22/21  Fritzi Mandes, MD  ?camphor-menthol [facility]) lotion Apply topically as needed for itching. ?Patient taking differently: Apply 1 application. topically as needed for itching. 05/09/21   Hosie Poisson, MD  ?clonazePAM (KLONOPIN) 0.25 MG disintegrating tablet Take 1 tablet (0.25 mg total) by mouth 2 (two) times daily. 05/09/21   Hosie Poisson, MD  ?diclofenac Sodium (VOLTAREN) 1 % GEL Apply 2 g topically 4 (four) times daily. 01/09/21   [provider]  ?divalproex (DEPAKOTE) 250 MG DR tablet Take 1,250 mg by mouth at bedtime.    [provider]  ?gabapentin (NEURONTIN) 100 MG capsule Take 2 capsules (200 mg total) by mouth 3 (three) times daily. 05/09/21   Hosie Poisson, MD  ?Glycerin-Hypromellose-PEG 400 0.2-0.2-1 % SOLN Place 1 drop into both eyes in the morning, at noon, and at bedtime.    [provider]  ?insulin glargine-yfgn (SEMGLEE) 100 UNIT/ML injection Inject 0.15 mLs (15 Units total) into the skin 2 (two) times daily. 05/23/21   Pokhrel, Corrie Mckusick, MD  ?insulin lispro (HUMALOG) 100 UNIT/ML injection Inject 2-5 Units into the skin 3 (three) times daily before meals. Per sliding scale    [provider]  ?liver oil-zinc oxide (DESITIN) 40 % ointment Apply topically as needed for irritation. ?Patient taking differently: Apply 1 application. topically as needed for irritation. 03/14/21   Fritzi Mandes, MD  ?melatonin 3 MG TABS tablet Take 2 tablets (6 mg total) by mouth at bedtime. 05/09/21   Hosie Poisson, MD  ?Multiple Vitamin (MULTIVITAMIN WITH MINERALS) TABS tablet Take 1 tablet by mouth daily. 05/10/21   Hosie Poisson, MD  ?OLANZapine (ZYPREXA) 7.5 MG tablet Take 1 tablet (7.5 mg total) by mouth at bedtime. 05/09/21   Hosie Poisson, MD  ?Pancrelipase, Lip-Prot-Amyl, 24000-76000 units CPEP Take 3  capsules (72,000 Units total) by mouth 3 (three) times daily. 12/27/20   Loletha Grayer, MD  ?polyethylene glycol (MIRALAX / GLYCOLAX) 17 g packet Place 17 g into feeding tube daily as needed for mild constipation. ?Patient taking differently: Take 17 g by mouth daily as needed for mild constipation. 05/09/21   Hosie Poisson, MD  ?rOPINIRole (REQUIP) 1 MG tablet Take 1 tablet (1 mg total) by mouth 2 (two) times daily. ?Patient taking differently: Take 1 mg by mouth in the morning and at bedtime. 08/04/19   Bary Leriche, PA-C  ?thiamine 100 MG tablet Take 1 tablet (100 mg total) by mouth daily. 05/10/21   Hosie Poisson, MD  ? ? ?Physical Exam: ?Vitals:  ? 06/25/21 1530 06/25/21 1534 06/25/21 1645 06/25/21 1700  ?BP:  (!) 136/105  (!) 147/85  ?Pulse: 76 78 77 80  ?Resp: (!) '22 17 13 14  '$ ?Temp:      ?TempSrc:      ?SpO2: 100% 99% 100% 97%  ?Weight:      ? ? ?Constitutional: NAD, alert, did  not answer orientation questions  ?HEENT: conjunctivae and lids normal, EOMI ?CV: No cyanosis.   ?RESP: normal respiratory effort, on RA ?Extremities: No effusions, edema in BLE ?SKIN: warm, dry ?Neuro: II - XII grossly intact.   ? ? ?Labs on Admission: I have personally reviewed following labs and imaging studies ? ?CBC: ?Recent Labs  ?Lab 06/25/21 ?1422  ?WBC 6.3  ?NEUTROABS 3.6  ?HGB 12.3  ?HCT 39.9  ?MCV 89.9  ?PLT 404*  ? ?Basic Metabolic Panel: ?Recent Labs  ?Lab 06/25/21 ?1422  ?NA 124*  ?K 6.6*  ?CL 92*  ?CO2 26  ?GLUCOSE 882*  ?BUN 18  ?CREATININE 0.97  ?CALCIUM 9.7  ? ?GFR: ?Estimated Creatinine Clearance: 39.2 mL/min (by C-G formula based on SCr of 0.97 mg/dL). ?Liver Function Tests: ?Recent Labs  ?Lab 06/25/21 ?1422  ?AST 33  ?ALT 48*  ?ALKPHOS 194*  ?BILITOT 0.3  ?PROT 8.5*  ?ALBUMIN 3.0*  ? ?No results for input(s): LIPASE, AMYLASE in the last 168 hours. ?Recent Labs  ?Lab 06/25/21 ?1425  ?AMMONIA 10  ? ?Coagulation Profile: ?No results for input(s): INR, PROTIME in the last 168 hours. ?Cardiac Enzymes: ?No results for  input(s): CKTOTAL, CKMB, CKMBINDEX, TROPONINI in the last 168 hours. ?BNP (last 3 results) ?No results for input(s): PROBNP in the last 8760 hours. ?HbA1C: ?No results for input(s): HGBA1C in the last 7

## 2021-06-25 NOTE — ED Provider Notes (Addendum)
? ?[facility] ?Provider Note ? ? ? Event Date/Time  ? First MD Initiated Contact with Patient 06/25/21 1418   ?  (approximate) ? ? ?History  ? ?Altered Mental Status ? ? ?HPI ? ?Brittany [name] is a 61 y.o. 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  Apparently patient drank a bottle of coffee creamer this morning.  On EMS arrival she was combative, received 2.5 mg of Versed.  Patient states that she drinks occasionally initiating something "lightheadedness weakness".  She states she thinks she fell but is not sure. ?She endorses some nausea as well as headache.  She denies any neck pain, back change in her ostomy output, urinary symptoms rash or extremity pain.  No chest pain, cough or fevers.  She denies any recent ethanol use or illicit drug use.  History is limited from the patient that she is not oriented. ? ?  ? ? ?Physical Exam  ?Triage Vital Signs: ?ED Triage Vitals  ?Enc Vitals Group  ?   BP   ?   Pulse   ?   Resp   ?   Temp   ?   Temp src   ?   SpO2   ?   Weight   ?   Height   ?   Head Circumference   ?   Peak Flow   ?   Pain Score   ?   Pain Loc   ?   Pain Edu?   ?   Excl. in [place]?   ? ? ?Most recent vital signs: ?Vitals:  ? 06/25/21 1424  ?BP: (!) 120/101  ?Pulse: 80  ?Resp: (!) 21  ?Temp: (!) 97.5 ?F (36.4 ?C)  ?SpO2: 100%  ? ? ?General: Awake, ill-appearing. ?CV:  Long capillary fill in the digits.  2+ radial pulse.  No significant murmur. ?Resp:  Normal effort.  Clear bilaterally. ?Abd:  No distention.  Soft with ostomy in place. ?Other:  No cranial nerve deficits.  Patient's speech is slightly slurred.  No tenderness along the C/T/L-spine. Weak on R hemi-body but otherwise able to move L arm and leg on command with decent strength.  Sensation is intact to light touch throughout all  extremities. ? ? ?ED Results / Procedures / Treatments  ?Labs ?(all labs ordered are listed, but only abnormal results are displayed) ?Labs Reviewed  ?CBG MONITORING, ED - Abnormal; Notable for the following components:  ?    Result Value  ? Glucose-Capillary >600 (*)   ? All other components within normal limits  ?RESP PANEL BY RT-PCR (FLU A&B, COVID) ARPGX2  ?BLOOD GAS, VENOUS  ?URINALYSIS, COMPLETE (UACMP) WITH MICROSCOPIC  ?CBC WITH DIFFERENTIAL/PLATELET  ?COMPREHENSIVE METABOLIC PANEL  ?BETA-HYDROXYBUTYRIC ACID  ?LACTIC ACID, PLASMA  ?LACTIC ACID, PLASMA  ?PROCALCITONIN  ?TSH  ?AMMONIA  ?ETHANOL  ?TROPONIN I (HIGH SENSITIVITY)  ? ? ? ?EKG ? ?ECG is remarkable for sinus rhythm with a ventricular rate of 77 with nonspecific ST change in aVL without any other clear evidence of acute ischemia or significant arrhythmia.  Normal axis and unremarkable intervals. ? ? ?RADIOLOGY ? ? ? ?PROCEDURES: ? ?Critical Care performed: Yes, see critical care procedure note(s) ? ?.Critical Care ?Performed by: Lucrezia Starch, MD ?Authorized by: Lucrezia Starch, MD  ? ?Critical care provider statement:  ?  Critical care time (minutes):  30 ?  Critical care was necessary to treat or prevent imminent or life-threatening deterioration of the following conditions:  Dehydration, hepatic failure and metabolic crisis ?  Critical care was time spent personally by me on the following activities:  Development of treatment plan with patient or surrogate, discussions with consultants, evaluation of patient's response to treatment, examination of patient, ordering and review of laboratory studies, ordering and review of radiographic studies, ordering and performing treatments and interventions, pulse oximetry, re-evaluation of patient's condition and review of old charts ?.1-3 Lead EKG Interpretation ?Performed by: Lucrezia Starch, MD ?Authorized by: Lucrezia Starch, MD  ? ?  Interpretation: normal   ?  ECG rate assessment: normal   ?   Rhythm: sinus rhythm   ?  Ectopy: none   ?  Conduction: normal   ? ?The patient is on the cardiac monitor to evaluate for evidence of arrhythmia and/or significant heart rate changes. ? ? ?MEDICATIONS ORDERED IN ED: ?Medications  ?lactated ringers bolus 1,000 mL (has no administration in time range)  ? ? ? ?IMPRESSION / MDM / ASSESSMENT AND PLAN / ED COURSE  ?I reviewed the triage vital signs and the nursing notes. ?             ?               ? ?Differential diagnosis includes, but is not limited to DKA, HHS, sepsis, arrhythmia, anemia, other endocrine derangement such as hypothyroidism, possible occult intracranial head injury versus intracranial hemorrhage or C-spine injury.  He does not have any clear focal deficits at this time to suggest acute CVA.  Low suspicion for withdrawal. ? ?Arrival CBG is greater than 600.  Plan to order labs including sepsis screening labs and DKA screening labs as well as a chest x-ray and CT of the head and neck.  The meantime we will start patient on IV fluids. ? ?Care patient signed over to assuming father at approximately 1500.  Plan is to follow-up labs and imaging and reassess and dispo appropriately. ?  ? ? ?FINAL CLINICAL IMPRESSION(S) / ED DIAGNOSES  ? ?Final diagnoses:  ?Altered mental status, unspecified altered mental status type  ?Hyperglycemia  ? ? ? ?Rx / DC Orders  ? ?ED Discharge Orders   ? ? None  ? ?  ? ? ? ?Note:  This document was prepared using Dragon voice recognition software and may include unintentional dictation errors. ?  ?Lucrezia Starch, MD ?06/25/21 1521 ? ?  ?Lucrezia Starch, MD ?06/25/21 1634 ? ?

## 2021-06-25 NOTE — ED Provider Notes (Signed)
Reviewed labs.  Patient has profound hyperglycemia with glucose 82, hyperkalemia.  Creatinine is normal, however.  Beta hydroxy is not markedly elevated.  Blood gas is less consistent with DKA.  Suspect HHS.  We will place the patient on insulin drip, temporized with calcium as well as Lokelma and albuterol.  She will be admitted to the medicine service here.  CT scan is negative.  Patient updated and is in agreement.  She has been accused of protecting airway.  No seizure-like activity. ?  ?Duffy Bruce, MD ?06/25/21 1713 ? ?

## 2021-06-25 NOTE — ED Notes (Signed)
RN attempted to call report to ICU. ?

## 2021-06-25 NOTE — ED Notes (Addendum)
RN attempted to call Viviane, [place] (Daughter)  [phone] multiple times to find out pt hx and the story for her visit today. RN unable to get a hold of daughter. Phone goes straight to VM. ?

## 2021-06-25 NOTE — ED Notes (Signed)
RN emptied pt colostomy bag. ?

## 2021-06-25 NOTE — ED Triage Notes (Signed)
Pt arrived via EMS from home due to hyperglycemia. Per pt family, pt drank full bottle of coffee creamer with AM since 0500. Pt received 2.'5mg'$  versed with EMS due to increase combativeness. Pt BGL with EMS read " HI ". Pt resting on stretcher at this time calmly.  ?

## 2021-06-25 NOTE — Progress Notes (Signed)
PHARMACIST - PHYSICIAN COMMUNICATION ? ?CONCERNING:  Enoxaparin (Lovenox) for DVT Prophylaxis  ? ? ?RECOMMENDATION: ?Patient was prescribed enoxaprin '40mg'$  q24 hours for VTE prophylaxis.  ? Danley Danker Weights  ? 06/25/21 1442  ?Weight: 40.8 kg (90 lb)  ? ? ?Body mass index is 17.58 kg/m?. ? ?Estimated Creatinine Clearance: 39.2 mL/min (by C-G formula based on SCr of 0.97 mg/dL). ? ? ?Patient is candidate for enoxaparin '30mg'$  every 24 hours based on CrCl <87m/min or Weight <45kg ? ?DESCRIPTION: ?[facility] ? ?Patient is now receiving enoxaparin 30 mg every 24 hours  ? ? ?SSherilyn Banker PharmD ?Clinical Pharmacist  ?06/25/2021 ?7:27 PM ? ?

## 2021-06-26 DIAGNOSIS — E1100 Type 2 diabetes mellitus with hyperosmolarity without nonketotic hyperglycemic-hyperosmolar coma (NKHHC): Secondary | ICD-10-CM | POA: Diagnosis not present

## 2021-06-26 LAB — PROCALCITONIN: Procalcitonin: <0.1 ng/mL

## 2021-06-26 LAB — CBC
HCT: 34.6 % — ABNORMAL LOW (ref 36.0–46.0)
Hemoglobin: 11.8 g/dL — ABNORMAL LOW (ref 12.0–15.0)
MCH: 28.1 pg (ref 26.0–34.0)
MCHC: 34.1 g/dL (ref 30.0–36.0)
MCV: 82.4 fL (ref 80.0–100.0)
Platelets: 351 10*3/uL (ref 150–400)
RBC: 4.2 MIL/uL (ref 3.87–5.11)
RDW: 14.4 % (ref 11.5–15.5)
WBC: 9.2 10*3/uL (ref 4.0–10.5)
nRBC: 0 % (ref 0.0–0.2)

## 2021-06-26 LAB — GLUCOSE, CAPILLARY
Glucose-Capillary: 141 mg/dL — ABNORMAL HIGH (ref 70–99)
Glucose-Capillary: 146 mg/dL — ABNORMAL HIGH (ref 70–99)
Glucose-Capillary: 146 mg/dL — ABNORMAL HIGH (ref 70–99)
Glucose-Capillary: 147 mg/dL — ABNORMAL HIGH (ref 70–99)
Glucose-Capillary: 159 mg/dL — ABNORMAL HIGH (ref 70–99)
Glucose-Capillary: 169 mg/dL — ABNORMAL HIGH (ref 70–99)
Glucose-Capillary: 172 mg/dL — ABNORMAL HIGH (ref 70–99)
Glucose-Capillary: 185 mg/dL — ABNORMAL HIGH (ref 70–99)
Glucose-Capillary: 189 mg/dL — ABNORMAL HIGH (ref 70–99)
Glucose-Capillary: 189 mg/dL — ABNORMAL HIGH (ref 70–99)
Glucose-Capillary: 194 mg/dL — ABNORMAL HIGH (ref 70–99)
Glucose-Capillary: 206 mg/dL — ABNORMAL HIGH (ref 70–99)
Glucose-Capillary: 210 mg/dL — ABNORMAL HIGH (ref 70–99)
Glucose-Capillary: 321 mg/dL — ABNORMAL HIGH (ref 70–99)
Glucose-Capillary: 480 mg/dL — ABNORMAL HIGH (ref 70–99)

## 2021-06-26 LAB — BASIC METABOLIC PANEL
Anion gap: 10 (ref 5–15)
BUN: 10 mg/dL (ref 8–23)
CO2: 22 mmol/L (ref 22–32)
Calcium: 9.7 mg/dL (ref 8.9–10.3)
Chloride: 108 mmol/L (ref 98–111)
Creatinine, Ser: 0.54 mg/dL (ref 0.44–1.00)
GFR, Estimated: >60 mL/min (ref >60)
Glucose, Bld: 136 mg/dL — ABNORMAL HIGH (ref 70–99)
Potassium: 4.6 mmol/L (ref 3.5–5.1)
Sodium: 140 mmol/L (ref 135–145)

## 2021-06-26 LAB — MAGNESIUM: Magnesium: 1.5 mg/dL — ABNORMAL LOW (ref 1.7–2.4)

## 2021-06-26 LAB — LACTIC ACID, PLASMA: Lactic Acid, Venous: 3.2 mmol/L (ref 0.5–1.9)

## 2021-06-26 MED ORDER — CHLORHEXIDINE GLUCONATE CLOTH 2 % EX PADS
6.0000 | MEDICATED_PAD | Freq: Every day | CUTANEOUS | Status: DC
  Administered 2021-06-26: 6 via TOPICAL

## 2021-06-26 MED ORDER — MIRTAZAPINE 15 MG PO TABS
15.0000 mg | ORAL_TABLET | Freq: Every day | ORAL | Status: DC
  Administered 2021-06-26: 15 mg via ORAL
  Filled 2021-06-26: qty 1

## 2021-06-26 MED ORDER — ROPINIROLE HCL 1 MG PO TABS: 1.0000 mg | ORAL_TABLET | Freq: Every day | ORAL | Status: DC

## 2021-06-26 MED ORDER — LACTATED RINGERS IV SOLN: INTRAVENOUS | Status: AC

## 2021-06-26 MED ORDER — INSULIN ASPART 100 UNIT/ML IJ SOLN
0.0000 [IU] | Freq: Three times a day (TID) | INTRAMUSCULAR | Status: DC
  Administered 2021-06-26: 3 [IU] via SUBCUTANEOUS
  Administered 2021-06-26: 2 [IU] via SUBCUTANEOUS
  Administered 2021-06-26: 5 [IU] via SUBCUTANEOUS
  Administered 2021-06-27: 3 [IU] via SUBCUTANEOUS
  Administered 2021-06-27: 8 [IU] via SUBCUTANEOUS
  Filled 2021-06-26 (×5): qty 1

## 2021-06-26 MED ORDER — INSULIN GLARGINE-YFGN 100 UNIT/ML ~~LOC~~ SOLN
30.0000 [IU] | Freq: Every day | SUBCUTANEOUS | Status: DC
  Administered 2021-06-26: 30 [IU] via SUBCUTANEOUS
  Filled 2021-06-26 (×2): qty 0.3

## 2021-06-26 MED ORDER — MAGNESIUM SULFATE 2 GM/50ML IV SOLN
2.0000 g | Freq: Once | INTRAVENOUS | Status: AC
Start: 2021-06-26 — End: 2021-06-26
  Administered 2021-06-26: 2 g via INTRAVENOUS
  Filled 2021-06-26: qty 50

## 2021-06-26 NOTE — Progress Notes (Signed)
Transition orders placed. AM assessment limited due to drowsiness. Pt now awake eating meal, insulin gtt off. Alert and oriented X2.  ?

## 2021-06-26 NOTE — Progress Notes (Signed)
?PROGRESS NOTE ? ? ? ?Brittany [name]  DXA:[id number] DOB: [birth year] DOA: 06/25/2021 ?PCP: [facility]  ?[id number] ? ? ?Brittany [name]: ?  ?Principal Problem: ?  Hyperosmolar hyperglycemic state (HHS) ([name]) ? ? ?AVO SCHLACHTER is a 61 y.o. 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 ?  ?# HHS ?# DM2, poorly controlled ?# Insulin non-compliance ?--BG 882 on presentation.  Not in DKA.  Likely due to insulin non-compliance and dietary indiscretion.  A1c has been ~15 for the past year. ?--started on insulin gtt via Endotool HHS protocol in the ED. ?[name]: ?--transition to glarine 30u today ?--SSI ?  ?# Hyperkalemia, resolved ?--temporized with Ca gluconate, Lokelma.  improved with insulin gtt ?--monitor with daily labs ?  ?# Pseudohyponatremia ?--Na 124, corrected to 136.5 ?  ?# Hx of pancreatectomy ?--cont home Creon TID with meals ?  ?# Hx of seizure ?--updated med rec showed pt wasn't taking Depakote PTA ?  ?# Hx of CVA with R hemiparesis ?--cont home ASA ?--resume home statin after discharge ?  ?# RLS ?--cont home Requip ? ?# Lactic acidosis ?--does not appear to be due to infection.   ?--LR'@100'$  for 10 hours ?--trend to normal ? ?# Hypomag ?--replete with IV mag ?  ? ?DVT prophylaxis: Lovenox SQ ?Code Status: Full code  ?Family Communication:  ?Level of care: Med-Surg ?Dispo:   ?The patient is from: home ?Anticipated d/c is to: home ?Anticipated d/c date is: tomorrow ? ? ?Subjective and Interval History:  ?Pt has been sleeping today.  Transitioned to subQ insulin. ? ? ?Objective: ?Vitals:  ? 06/26/21 1000 06/26/21 1100 06/26/21 1151 06/26/21 1313  ?BP: 116/75 138/90  123/67  ?Pulse: 79 71 79 80  ?Resp: '14 11 13 16  '$ ?Temp:   (!) 97.5 ?F (36.4 ?C) (!) 97.5 ?F (36.4 ?C)  ?TempSrc:   Oral Oral   ?SpO2: 98% 97% 100% 98%  ?Weight:      ? ? ?Intake/Output Summary (Last 24 hours) at 06/26/2021 1541 ?Last data filed at 06/26/2021 1300 ?Gross per 24 hour  ?Intake 2094.32 ml  ?Output 750 ml  ?Net 1344.32 ml  ? ?Filed Weights  ? 06/25/21 1442  ?Weight: 40.8 kg  ? ? ?Examination:  ? ?Constitutional: NAD, sleepy but arousable ?HEENT: conjunctivae and lids normal, EOMI ?CV: No cyanosis.   ?RESP: normal respiratory effort, on RA ?Extremities: No effusions, edema in BLE ?SKIN: warm, dry ? ? ?Data Reviewed: I have personally reviewed following labs and imaging studies ? ?CBC: ?Recent Labs  ?Lab 06/25/21 ?1422 06/26/21 ?2094  ?WBC 6.3 9.2  ?NEUTROABS 3.6  --   ?HGB 12.3 11.8*  ?HCT 39.9 34.6*  ?MCV 89.9 82.4  ?PLT 404* 351  ? ?Basic Metabolic Panel: ?Recent Labs  ?Lab 06/25/21 ?1422 06/26/21 ?7096  ?NA 124* 140  ?K 6.6* 4.6  ?CL 92* 108  ?CO2 26 22  ?GLUCOSE 882* 136*  ?BUN 18 10  ?CREATININE 0.97 0.54  ?CALCIUM 9.7 9.7  ?MG  --  1.5*  ? ?GFR: ?Estimated Creatinine Clearance: 47.6 mL/min (by C-G formula based on SCr of 0.54 mg/dL). ?Liver Function Tests: ?Recent Labs  ?Lab 06/25/21 ?1422  ?AST 33  ?ALT 48*  ?ALKPHOS 194*  ?BILITOT 0.3  ?PROT 8.5*  ?ALBUMIN 3.0*  ? ?No results for input(s): LIPASE, AMYLASE in the last 168 hours. ?Recent Labs  ?  Lab 06/25/21 ?1425  ?AMMONIA 10  ? ?Coagulation Profile: ?No results for input(s): INR, PROTIME in the last 168 hours. ?Cardiac Enzymes: ?No results for input(s): CKTOTAL, CKMB, CKMBINDEX, TROPONINI in the last 168 hours. ?BNP (last 3 results) ?No results for input(s): PROBNP in the last 8760 hours. ?HbA1C: ?No results for input(s): HGBA1C in the last 72 hours. ?CBG: ?Recent Labs  ?Lab 06/26/21 ?0602 06/26/21 ?0626 06/26/21 ?0908 06/26/21 ?1020 06/26/21 ?1249  ?GLUCAP 159* 169* 146* 141* 210*  ? ?Lipid Profile: ?No results for input(s): CHOL, HDL, LDLCALC, TRIG, CHOLHDL, LDLDIRECT in the last 72 hours. ?Thyroid Function Tests: ?Recent Labs  ?  06/25/21 ?1444  ?TSH 0.992  ? ?Anemia  Panel: ?No results for input(s): VITAMINB12, FOLATE, FERRITIN, TIBC, IRON, RETICCTPCT in the last 72 hours. ?Sepsis Labs: ?Recent Labs  ?Lab 06/25/21 ?1422 06/25/21 ?2116 06/26/21 ?9485 06/26/21 ?1435  ?PROCALCITON <0.10  --  <0.10  --   ?LATICACIDVEN 2.0* 3.9*  --  3.2*  ? ? ?Recent Results (from the past 240 hour(s))  ?Resp Panel by RT-PCR (Flu A&B, Covid)     Status: None  ? Collection Time: 06/25/21  2:49 PM  ? Specimen: Nasopharyngeal(NP) swabs in vial transport medium  ?Result Value Ref Range Status  ? SARS Coronavirus 2 by RT PCR NEGATIVE NEGATIVE Final  ?  Comment: (NOTE) ?SARS-CoV-2 target nucleic acids are NOT DETECTED. ? ?The SARS-CoV-2 RNA is generally detectable in upper respiratory ?specimens during the acute phase of infection. The lowest ?concentration of SARS-CoV-2 viral copies this assay can detect is ?138 copies/mL. A negative result does not preclude SARS-Cov-2 ?infection and should not be used as the sole basis for treatment or ?other patient management decisions. A negative result may occur with  ?improper specimen collection/handling, submission of specimen other ?than nasopharyngeal swab, presence of viral mutation(s) within the ?areas targeted by this assay, and inadequate number of viral ?copies(<138 copies/mL). A negative result must be combined with ?clinical observations, patient history, and epidemiological ?information. The expected result is Negative. ? ?Fact Sheet for Patients:  ?EntrepreneurPulse.com.au ? ?Fact Sheet for Healthcare Providers:  ?IncredibleEmployment.be ? ?This test is no t yet approved or cleared by the [place] FDA and  ?has been authorized for detection and/or diagnosis of SARS-CoV-2 by ?FDA under an Emergency Use Authorization (EUA). This EUA will remain  ?in effect (meaning this test can be used) for the duration of the ?COVID-19 declaration under Section 564(b)(1) of the Act, 21 ?U.S.C.section 360bbb-3(b)(1), unless the  authorization is terminated  ?or revoked sooner.  ? ? ?  ? Influenza A by PCR NEGATIVE NEGATIVE Final  ? Influenza B by PCR NEGATIVE NEGATIVE Final  ?  Comment: (NOTE) ?The Xpert Xpress SARS-CoV-2/FLU/RSV plus assay is intended as an aid ?in the diagnosis of influenza from Nasopharyngeal swab specimens and ?should not be used as a sole basis for treatment. Nasal washings and ?aspirates are unacceptable for Xpert Xpress SARS-CoV-2/FLU/RSV ?testing. ? ?Fact Sheet for Patients: ?EntrepreneurPulse.com.au ? ?Fact Sheet for Healthcare Providers: ?IncredibleEmployment.be ? ?This test is not yet approved or cleared by the [place] FDA and ?has been authorized for detection and/or diagnosis of SARS-CoV-2 by ?FDA under an Emergency Use Authorization (EUA). This EUA will remain ?in effect (meaning this test can be used) for the duration of the ?COVID-19 declaration under Section 564(b)(1) of the Act, 21 U.S.C. ?section 360bbb-3(b)(1), unless the authorization is terminated or ?revoked. ? ?Performed at [facility], [place], ?[place] [address] ?  ?  ? ? ?  Radiology Studies: ?DG Chest 2 View ? ?Result Date: 06/25/2021 ?CLINICAL DATA:  Altered mental status. EXAM: CHEST - 2 VIEW COMPARISON:  Chest radiograph 05/21/2021 FINDINGS: The cardiomediastinal silhouette is within normal limits. There are streaky opacities in the right greater than left lung bases with overall improved basilar lung aeration compared to the prior study. A 1.5 cm nodular opacity is now evident in the right lung base. There is a persistent small right pleural effusion. Background emphysema is noted. No pneumothorax is identified. Surgical clips are noted in the upper abdomen. No acute osseous abnormality is seen. IMPRESSION: 1. Improved lung aeration. Persistent streaky right greater than left basilar opacities suggesting atelectasis. A small nodular opacity in the right lung base is either new  or was obscured by atelectasis and pleural fluid on the prior study and may potentially reflect a focal area of pneumonia. Follow-up radiographs are recommended to ensure resolution. 2. Persistent small ri

## 2021-06-27 LAB — CBC
HCT: 36.3 % (ref 36.0–46.0)
Hemoglobin: 11.8 g/dL — ABNORMAL LOW (ref 12.0–15.0)
MCH: 27.8 pg (ref 26.0–34.0)
MCHC: 32.5 g/dL (ref 30.0–36.0)
MCV: 85.4 fL (ref 80.0–100.0)
Platelets: 414 10*3/uL — ABNORMAL HIGH (ref 150–400)
RBC: 4.25 MIL/uL (ref 3.87–5.11)
RDW: 15.2 % (ref 11.5–15.5)
WBC: 8.1 10*3/uL (ref 4.0–10.5)
nRBC: 0 % (ref 0.0–0.2)

## 2021-06-27 LAB — BASIC METABOLIC PANEL
Anion gap: 8 (ref 5–15)
BUN: 12 mg/dL (ref 8–23)
CO2: 25 mmol/L (ref 22–32)
Calcium: 9 mg/dL (ref 8.9–10.3)
Chloride: 102 mmol/L (ref 98–111)
Creatinine, Ser: 0.57 mg/dL (ref 0.44–1.00)
GFR, Estimated: >60 mL/min (ref >60)
Glucose, Bld: 239 mg/dL — ABNORMAL HIGH (ref 70–99)
Potassium: 4.1 mmol/L (ref 3.5–5.1)
Sodium: 135 mmol/L (ref 135–145)

## 2021-06-27 LAB — GLUCOSE, CAPILLARY
Glucose-Capillary: 188 mg/dL — ABNORMAL HIGH (ref 70–99)
Glucose-Capillary: 277 mg/dL — ABNORMAL HIGH (ref 70–99)

## 2021-06-27 LAB — LACTIC ACID, PLASMA
Lactic Acid, Venous: 1.7 mmol/L (ref 0.5–1.9)
Lactic Acid, Venous: 1.7 mmol/L (ref 0.5–1.9)

## 2021-06-27 LAB — MAGNESIUM: Magnesium: 1.6 mg/dL — ABNORMAL LOW (ref 1.7–2.4)

## 2021-06-27 MED ORDER — ROPINIROLE HCL 1 MG PO TABS: 1.0000 mg | ORAL_TABLET | Freq: Every day | ORAL | Status: DC

## 2021-06-27 MED ORDER — MELATONIN 3 MG PO TABS: 6.0000 mg | ORAL_TABLET | Freq: Every evening | ORAL | Status: DC | PRN

## 2021-06-27 MED ORDER — MAGNESIUM SULFATE 2 GM/50ML IV SOLN
2.0000 g | Freq: Once | INTRAVENOUS | Status: AC
  Administered 2021-06-27: 2 g via INTRAVENOUS
  Filled 2021-06-27: qty 50

## 2021-06-27 MED ORDER — INSULIN PEN NEEDLE 32G X 4 MM MISC: 1.0000 [IU] | Freq: Three times a day (TID) | 2 refills | Status: DC

## 2021-06-27 MED ORDER — OLANZAPINE 7.5 MG PO TABS: 7.5000 mg | ORAL_TABLET | Freq: Every day | ORAL | 0 refills | Status: DC

## 2021-06-27 MED ORDER — INSULIN LISPRO (1 UNIT DIAL) 100 UNIT/ML (KWIKPEN): 5.0000 [IU] | PEN_INJECTOR | Freq: Three times a day (TID) | SUBCUTANEOUS | 2 refills | Status: DC

## 2021-06-27 NOTE — Progress Notes (Signed)
Inpatient Diabetes Program Recommendations ? ?AACE/ADA: New Consensus Statement on Inpatient Glycemic Control ? ?Target Ranges:  Prepandial:   less than 140 mg/dL ?     Peak postprandial:   less than 180 mg/dL (1-2 hours) ?     Critically ill patients:  140 - 180 mg/dL  ? ? Latest Reference Range & Units 06/26/21 06:02 06/26/21 06:57 06/26/21 09:08 06/26/21 10:20 06/26/21 12:49 06/26/21 17:05 06/26/21 21:17 06/27/21 07:55  ?Glucose-Capillary 70 - 99 mg/dL 159 (H) 169 (H) 146 (H) 141 (H) 210 (H) 185 (H) 321 (H) 188 (H)  ? ? Latest Reference Range & Units 06/25/21 14:22  ?Glucose 70 - 99 mg/dL 882 (HH)  ? ?Review of Glycemic Control ? ?Outpatient Diabetes medications: Lantus 40 units QHS ?Current orders for Inpatient glycemic control: Semglee 30 units QHS, Novolog 0-15 units TID with meals ? ?Inpatient Diabetes Program Recommendations:   ? ?Insulin: Please consider ordering Novolog 3 units TID with meals for meal coverage if patient eats at least 50% of meals. ? ?NOTE: Patient well known to inpatient diabetes team due to frequent hospital admission. Last admitted 05/15/21-05/23/21. Patient's initial glucose 882 mg/dl on 06/25/20 and was ordered IV insulin for HHS. Patient has been transitioned from IV ot SQ insulin and received Semglee 30 units at 8:27 am on 06/26/21.  ? ?Thanks, ?Barnie Alderman, RN, MSN, CDE ?Diabetes Coordinator ?Inpatient Diabetes Program ?[phone] (Team Pager from 8am to 5pm) ? ? ? ?

## 2021-06-27 NOTE — TOC Transition Note (Signed)
Transition of Care (TOC) - CM/SW Discharge Note ? ? ?Patient Details  ?Name: Brittany [name] ?MRN: [id number] ?Date of Birth: 09/04/1960 ? ?Transition of Care (TOC) CM/SW Contact:  ?Alberteen Sam, LCSW ?Phone Number: ?06/27/2021, 9:26 AM ? ? ?Clinical Narrative:    ? ?Patient to discharge home today with [facility] PT OT and aide as she is active with [facility], CSW has informed [name] with [facility] of dc today.  ? ?Patient's daughter will pick patient up, she reports patient continues to see [facility] for her PCP needs and patient goes to [facility] in [place] for pharmacy, no discharge needs identified.  ? ?TOC signing off.  ? ? ?Final next level of care: [place] ?Barriers to Discharge: No Barriers Identified ? ? ?Patient Goals and CMS Choice ?Patient states their goals for this hospitalization and ongoing recovery are:: to go home ?CMS Medicare.gov Compare Post Acute Care list provided to:: Patient Represenative (must comment) (daughter) ?Choice offered to / list presented to : Adult Children ? ?Discharge Placement ?  ?           ?  ?Patient to be transferred to facility by: daughter to pick up ?Name of family member notified: daughter ?Patient and family notified of of transfer: 06/27/21 ? ?Discharge Plan and Services ?  ?  ?           ?  ?  ?  ?  ?  ?HH Arranged: PT, OT, Nurse's Aide ?[place] Agency: [facility] ?Date HH Agency Contacted: 06/27/21 ?Time [place]: [phone] ?Representative spoke with at [facility]: [name] ? ?Social Determinants of Health (SDOH) Interventions ?  ? ? ?Readmission Risk Interventions ? ?  03/06/2021  ?  9:13 AM 02/16/2021  ?  2:45 PM  ?Readmission Risk Prevention Plan  ?Transportation Screening Complete Complete  ?Medication Review Press photographer) Complete Complete  ?PCP or Specialist appointment within 3-5 days of discharge Complete   ?SW Recovery Care/Counseling Consult  Complete  ?Palliative Care Screening Not Applicable Not Applicable   ?[place] Not Applicable Not Applicable  ? ? ? ? ? ?

## 2021-06-27 NOTE — Progress Notes (Signed)
Pt discharged per MD order. IV removed. Discharge instructions reviewed with pts daughter over the phone. All questions answered to satisfction. Pt taken out in wheelchair by staff. ?

## 2021-06-27 NOTE — Discharge Summary (Signed)
Physician Discharge Summary   Brittany [name]  female DOB: [birth year]  TFT:[id number]  PCP: [facility]  Admit date: 06/25/2021 Discharge date: 06/27/2021  Admitted From: home Disposition:  home Daughter updated on the phone about discharge plans prior to discharge. Home Health: Yes CODE STATUS: Full code  Discharge Instructions     Diet Carb Modified   Complete by: As directed       Hospital Course:  For full details, please see H&P, progress notes, consult notes and ancillary notes.  Briefly,  Brittany [name] is a 61 y.o. 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  Per daughter, pt drank full bottle of coffee creamer the morning of presentation.   # HHS # DM2, poorly controlled # Insulin non-compliance --BG 882 on presentation.  Not in DKA.  Likely due to insulin non-compliance and dietary indiscretion.  A1c has been ~15 for the past year. --started on insulin gtt via Endotool HHS protocol in the ED and transitioned to subQ insulin the next day. --discussed with daughter, who helps administer Lantus 40u nightly to pt, however, pt did not have Rx for short-acting insulin PTA, and daughter reported pt snacking on sweets all day and all night long. --Pt was discharged on Lantus 40u nightly and short-acting insulin 5u TID with meals or significant carb intake.   # Hyperkalemia, resolved --temporized with Ca gluconate, Lokelma.  improved with insulin gtt   # Pseudohyponatremia --Na 124, corrected to 136.5   # Hx of pancreatectomy --cont home Creon TID with meals   # Hx of seizure --updated med rec showed pt wasn't taking Depakote PTA   # Hx of CVA with R hemiparesis --cont home ASA --resume home statin after discharge   # RLS --cont home  Requip   # Lactic acidosis --does not appear to be due to infection.  resolved with IVF.   # Hypomag --repleted with IV mag   Discharge Diagnoses:  Principal Problem:   Hyperosmolar hyperglycemic state (HHS) ([place])   30 Day Unplanned Readmission Risk Score    Flowsheet Row ED to Hosp-Admission (Current) from 06/25/2021 in [facility]  30 Day Unplanned Readmission Risk Score (%) 51.56 Filed at 06/27/2021 0400       This score is the patient's risk of an unplanned readmission within 30 days of being discharged (0 -100%). The score is based on dignosis, age, lab data, medications, orders, and past utilization.   Low:  0-14.9   Medium: 15-21.9   High: 22-29.9   Extreme: 30 and above         Discharge Instructions:  Allergies as of 06/27/2021       Reactions   Cephalosporins Itching   TOLERATED ZOSYN (PIPERACILLIN) BEFORE   Hydromorphone Itching   Keflin [cephalothin] Itching   Lactose Intolerance (gi) Diarrhea        Medication List     STOP taking these medications    clonazePAM 0.25 MG disintegrating tablet Commonly known as: KLONOPIN   insulin glargine-yfgn 100 UNIT/ML injection Commonly known as: SEMGLEE       TAKE these medications    albuterol 108 (90 Base) MCG/ACT inhaler Commonly known as: VENTOLIN HFA Inhale 2 puffs into the lungs every 6 (six) hours as needed for wheezing or shortness of breath.   aspirin 81 MG EC  tablet Take 1 tablet (81 mg total) by mouth daily. Swallow whole.   atorvastatin 40 MG tablet Commonly known as: LIPITOR Take 1 tablet (40 mg total) by mouth Nightly. What changed: when to take this   camphor-menthol lotion Commonly known as: SARNA Apply topically as needed for itching. What changed: how much to take   diclofenac Sodium 1 % Gel Commonly known as: VOLTAREN Apply 2 g topically 4 (four) times daily as needed.   insulin lispro 100 UNIT/ML KwikPen Commonly known as: HumaLOG  KwikPen Inject 5 Units into the skin 3 (three) times daily with meals.   Insulin Pen Needle 32G X 4 MM Misc 1 Units by Does not apply route 3 (three) times daily.   Lantus SoloStar 100 UNIT/ML Solostar Pen Generic drug: insulin glargine SMARTSIG:40 Unit(s) SUB-Q Every Night   melatonin 3 MG Tabs tablet Take 2 tablets (6 mg total) by mouth at bedtime as needed. Home med. What changed:  when to take this reasons to take this additional instructions   mirtazapine 15 MG tablet Commonly known as: REMERON Take 15 mg by mouth at bedtime.   multivitamin with minerals Tabs tablet Take 1 tablet by mouth daily.   OLANZapine 7.5 MG tablet Commonly known as: ZYPREXA Take 1 tablet (7.5 mg total) by mouth at bedtime.   Pancrelipase (Lip-Prot-Amyl) 24000-76000 units Cpep Take 3 capsules (72,000 Units total) by mouth 3 (three) times daily.   polyethylene glycol 17 g packet Commonly known as: MIRALAX / GLYCOLAX Place 17 g into feeding tube daily as needed for mild constipation. What changed: how to take this   rOPINIRole 1 MG tablet Commonly known as: REQUIP Take 1 tablet (1 mg total) by mouth at bedtime. Home med. What changed:  when to take this additional instructions         Follow-up [facility]. Go on 07/04/2021.   Why: 11am appointment Contact information: [place] [address] [phone]                 Allergies  Allergen Reactions   Cephalosporins Itching    TOLERATED ZOSYN (PIPERACILLIN) BEFORE   Hydromorphone Itching   Keflin [Cephalothin] Itching   Lactose Intolerance (Gi) Diarrhea     The results of significant diagnostics from this hospitalization (including imaging, microbiology, ancillary and laboratory) are listed below for reference.   Consultations:   Procedures/Studies: DG Chest 2 View  Result Date: 06/25/2021 CLINICAL DATA:  Altered mental status. EXAM: CHEST - 2 VIEW  COMPARISON:  Chest radiograph 05/21/2021 FINDINGS: The cardiomediastinal silhouette is within normal limits. There are streaky opacities in the right greater than left lung bases with overall improved basilar lung aeration compared to the prior study. A 1.5 cm nodular opacity is now evident in the right lung base. There is a persistent small right pleural effusion. Background emphysema is noted. No pneumothorax is identified. Surgical clips are noted in the upper abdomen. No acute osseous abnormality is seen. IMPRESSION: 1. Improved lung aeration. Persistent streaky right greater than left basilar opacities suggesting atelectasis. A small nodular opacity in the right lung base is either new or was obscured by atelectasis and pleural fluid on the prior study and may potentially reflect a focal area of pneumonia. Follow-up radiographs are recommended to ensure resolution. 2. Persistent small right pleural effusion. Electronically Signed   By: Logan Bores M.D.   On: 06/25/2021 16:26   CT HEAD WO CONTRAST (5MM)  Result Date: 06/25/2021 CLINICAL DATA:  Trauma, altered mental status EXAM: CT HEAD WITHOUT CONTRAST TECHNIQUE: Contiguous axial images were obtained from the base of the skull through the vertex without intravenous contrast. RADIATION DOSE REDUCTION: This exam was performed according to the departmental dose-optimization program which includes automated exposure control, adjustment of the mA and/or kV according to patient size and/or use of iterative reconstruction technique. COMPARISON:  05/15/2021 FINDINGS: Brain: No acute intracranial findings are seen. There are no signs of bleeding. There are foci of encephalomalacia in the right frontal lobe, left temporoparietal cortex and left cerebellum with no significant interval change. Cortical sulci are prominent. There is decreased density in the periventricular and subcortical white matter. Vascular: Unremarkable. Skull: No fracture is seen in the  calvarium. Sinuses/Orbits: There is mild mucosal thickening in the ethmoid sinus. Other: None IMPRESSION: No acute intracranial findings are seen. Old infarcts are seen in both cerebral hemispheres and left cerebellum with no significant interval change. Atrophy. Small-vessel disease. Electronically Signed   By: Elmer Picker M.D.   On: 06/25/2021 16:35   CT Cervical Spine Wo Contrast  Result Date: 06/25/2021 CLINICAL DATA:  Neck trauma EXAM: CT CERVICAL SPINE WITHOUT CONTRAST TECHNIQUE: Multidetector CT imaging of the cervical spine was performed without intravenous contrast. Multiplanar CT image reconstructions were also generated. RADIATION DOSE REDUCTION: This exam was performed according to the departmental dose-optimization program which includes automated exposure control, adjustment of the mA and/or kV according to patient size and/or use of iterative reconstruction technique. COMPARISON:  CT cervical spine 04/05/2021 FINDINGS: Alignment: Reversal of the normal lordotic curvature similar to previous study centered at C5. Grade 1 anterolisthesis of C3 on C4 and C4 on C5, unchanged. No acute malalignment identified. Skull base and vertebrae: No acute fracture. No primary bone lesion or focal pathologic process. Soft tissues and spinal canal: No prevertebral fluid or swelling. No visible canal hematoma. Disc levels: Mild to moderate intervertebral disc space narrowing at C5-C6 and C6-C7 with small dorsal endplate osteophytes and uncovertebral spurring. Mild facet arthropathy throughout the cervical spine. Upper chest: No acute process identified. Emphysematous changes of the lungs. Other: None. IMPRESSION: 1. No acute fracture or malalignment identified in the cervical spine. Stable chronic changes. 2. Emphysema. Electronically Signed   By: Ofilia Neas M.D.   On: 06/25/2021 16:24      Labs: BNP (last 3 results) No results for input(s): BNP in the last 8760 hours. Basic Metabolic  Panel: Recent Labs  Lab 06/25/21 1422 06/26/21 0506 06/27/21 0506  NA 124* 140 135  K 6.6* 4.6 4.1  CL 92* 108 102  CO2 '26 22 25  '$ GLUCOSE 882* 136* 239*  BUN '18 10 12  '$ CREATININE 0.97 0.54 0.57  CALCIUM 9.7 9.7 9.0  MG  --  1.5* 1.6*   Liver Function Tests: Recent Labs  Lab 06/25/21 1422  AST 33  ALT 48*  ALKPHOS 194*  BILITOT 0.3  PROT 8.5*  ALBUMIN 3.0*   No results for input(s): LIPASE, AMYLASE in the last 168 hours. Recent Labs  Lab 06/25/21 1425  AMMONIA 10   CBC: Recent Labs  Lab 06/25/21 1422 06/26/21 0506 06/27/21 0506  WBC 6.3 9.2 8.1  NEUTROABS 3.6  --   --   HGB 12.3 11.8* 11.8*  HCT 39.9 34.6* 36.3  MCV 89.9 82.4 85.4  PLT 404* 351 414*   Cardiac Enzymes: No results for input(s): CKTOTAL, CKMB, CKMBINDEX, TROPONINI in the last 168 hours. BNP: Invalid input(s): POCBNP CBG: Recent Labs  Lab 06/26/21 1249 06/26/21 1705 06/26/21 2117 06/27/21 0755 06/27/21 1134  GLUCAP 210* 185* 321* 188* 277*   D-Dimer No results for input(s): DDIMER in the last 72 hours. Hgb A1c No results for input(s): HGBA1C in the last 72 hours. Lipid Profile No results for input(s): CHOL, HDL, LDLCALC, TRIG, CHOLHDL, LDLDIRECT in the last 72 hours. Thyroid function studies Recent Labs    06/25/21 1444  TSH 0.992   Anemia work up No results for input(s): VITAMINB12, FOLATE, FERRITIN, TIBC, IRON, RETICCTPCT in the last 72 hours. Urinalysis    Component Value Date/Time   COLORURINE STRAW (A) 06/25/2021 1449   APPEARANCEUR CLEAR (A) 06/25/2021 1449   APPEARANCEUR Hazy 04/04/2011 1935   LABSPEC 1.023 06/25/2021 1449   LABSPEC >1.060 04/04/2011 1935   PHURINE 5.0 06/25/2021 1449   GLUCOSEU >=500 (A) 06/25/2021 1449   GLUCOSEU 50 mg/dL 04/04/2011 1935   HGBUR NEGATIVE 06/25/2021 1449   BILIRUBINUR NEGATIVE 06/25/2021 1449   BILIRUBINUR Negative 04/04/2011 1935   KETONESUR NEGATIVE 06/25/2021 [place] 06/25/2021 1449   NITRITE NEGATIVE  06/25/2021 1449   LEUKOCYTESUR NEGATIVE 06/25/2021 1449   LEUKOCYTESUR Negative 04/04/2011 1935   Sepsis Labs Invalid input(s): PROCALCITONIN,  WBC,  LACTICIDVEN Microbiology Recent Results (from the past 240 hour(s))  Resp Panel by RT-PCR (Flu A&B, Covid)     Status: None   Collection Time: 06/25/21  2:49 PM   Specimen: Nasopharyngeal(NP) swabs in vial transport medium  Result Value Ref Range Status   SARS Coronavirus 2 by RT PCR NEGATIVE NEGATIVE Final    Comment: (NOTE) SARS-CoV-2 target nucleic acids are NOT DETECTED.  The SARS-CoV-2 RNA is generally detectable in upper respiratory specimens during the acute phase of infection. The lowest concentration of SARS-CoV-2 viral copies this assay can detect is 138 copies/mL. A negative result does not preclude SARS-Cov-2 infection and should not be used as the sole basis for treatment or other patient management decisions. A negative result may occur with  improper specimen collection/handling, submission of specimen other than nasopharyngeal swab, presence of viral mutation(s) within the areas targeted by this assay, and inadequate number of viral copies(<138 copies/mL). A negative result must be combined with clinical observations, patient history, and epidemiological information. The expected result is Negative.  Fact Sheet for Patients:  EntrepreneurPulse.com.au  Fact Sheet for Healthcare Providers:  IncredibleEmployment.be  This test is no t yet approved or cleared by the [place] FDA and  has been authorized for detection and/or diagnosis of SARS-CoV-2 by FDA under an Emergency Use Authorization (EUA). This EUA will remain  in effect (meaning this test can be used) for the duration of the COVID-19 declaration under Section 564(b)(1) of the Act, 21 U.S.C.section 360bbb-3(b)(1), unless the authorization is terminated  or revoked sooner.       Influenza A by PCR NEGATIVE NEGATIVE  Final   Influenza B by PCR NEGATIVE NEGATIVE Final    Comment: (NOTE) The Xpert Xpress SARS-CoV-2/FLU/RSV plus assay is intended as an aid in the diagnosis of influenza from Nasopharyngeal swab specimens and should not be used as a sole basis for treatment. Nasal washings and aspirates are unacceptable for Xpert Xpress SARS-CoV-2/FLU/RSV testing.  Fact Sheet for Patients: EntrepreneurPulse.com.au  Fact Sheet for Healthcare Providers: IncredibleEmployment.be  This test is not yet approved or cleared by the [place] FDA and has been authorized for detection and/or diagnosis of SARS-CoV-2 by FDA under an Emergency Use Authorization (EUA). This EUA will remain in effect (meaning this test  can be used) for the duration of the COVID-19 declaration under Section 564(b)(1) of the Act, 21 U.S.C. section 360bbb-3(b)(1), unless the authorization is terminated or revoked.  Performed at [facility], [address] [place], [place] [address]      Total time spend on discharging this patient, including the last patient exam, discussing the hospital stay, instructions for ongoing care as it relates to all pertinent caregivers, as well as preparing the medical discharge records, prescriptions, and/or referrals as applicable, is 45 minutes.    Enzo Bi, MD  [facility] 06/27/2021, 12:58 PM

## 2021-07-26 ENCOUNTER — Emergency Department: Payer: Medicare Other

## 2021-07-26 ENCOUNTER — Other Ambulatory Visit: Payer: Self-pay

## 2021-07-26 ENCOUNTER — Inpatient Hospital Stay
Admission: EM | Admit: 2021-07-26 | Discharge: 2021-07-28 | DRG: 871 | Disposition: A | Discharge status: Home Health | Payer: Medicare Other | Attending: Internal Medicine | Admitting: Internal Medicine

## 2021-07-26 DIAGNOSIS — E440 Moderate protein-calorie malnutrition: Secondary | ICD-10-CM | POA: Diagnosis not present

## 2021-07-26 DIAGNOSIS — Z8507 Personal history of malignant neoplasm of pancreas: Secondary | ICD-10-CM | POA: Diagnosis not present

## 2021-07-26 DIAGNOSIS — T68XXXA Hypothermia, initial encounter: Secondary | ICD-10-CM | POA: Diagnosis present

## 2021-07-26 DIAGNOSIS — A419 Sepsis, unspecified organism: Principal | ICD-10-CM | POA: Diagnosis present

## 2021-07-26 DIAGNOSIS — Z720 Tobacco use: Secondary | ICD-10-CM | POA: Diagnosis present

## 2021-07-26 DIAGNOSIS — Z7982 Long term (current) use of aspirin: Secondary | ICD-10-CM

## 2021-07-26 DIAGNOSIS — Z681 Body mass index (BMI) 19 or less, adult: Secondary | ICD-10-CM

## 2021-07-26 DIAGNOSIS — R64 Cachexia: Secondary | ICD-10-CM | POA: Diagnosis present

## 2021-07-26 DIAGNOSIS — E43 Unspecified severe protein-calorie malnutrition: Secondary | ICD-10-CM | POA: Diagnosis present

## 2021-07-26 DIAGNOSIS — G2581 Restless legs syndrome: Secondary | ICD-10-CM | POA: Diagnosis present

## 2021-07-26 DIAGNOSIS — R739 Hyperglycemia, unspecified: Secondary | ICD-10-CM

## 2021-07-26 DIAGNOSIS — F331 Major depressive disorder, recurrent, moderate: Secondary | ICD-10-CM | POA: Diagnosis present

## 2021-07-26 DIAGNOSIS — I69351 Hemiplegia and hemiparesis following cerebral infarction affecting right dominant side: Secondary | ICD-10-CM

## 2021-07-26 DIAGNOSIS — E785 Hyperlipidemia, unspecified: Secondary | ICD-10-CM | POA: Diagnosis present

## 2021-07-26 DIAGNOSIS — F0393 Unspecified dementia, unspecified severity, with mood disturbance: Secondary | ICD-10-CM | POA: Diagnosis present

## 2021-07-26 DIAGNOSIS — Z933 Colostomy status: Secondary | ICD-10-CM

## 2021-07-26 DIAGNOSIS — F17200 Nicotine dependence, unspecified, uncomplicated: Secondary | ICD-10-CM | POA: Diagnosis present

## 2021-07-26 DIAGNOSIS — E1110 Type 2 diabetes mellitus with ketoacidosis without coma: Secondary | ICD-10-CM | POA: Diagnosis present

## 2021-07-26 DIAGNOSIS — E1169 Type 2 diabetes mellitus with other specified complication: Secondary | ICD-10-CM | POA: Diagnosis present

## 2021-07-26 DIAGNOSIS — Z91119 Patient's noncompliance with dietary regimen due to unspecified reason: Secondary | ICD-10-CM

## 2021-07-26 DIAGNOSIS — I700 Atherosclerosis of aorta: Secondary | ICD-10-CM | POA: Diagnosis present

## 2021-07-26 DIAGNOSIS — E8729 Other acidosis: Secondary | ICD-10-CM

## 2021-07-26 DIAGNOSIS — Z8673 Personal history of transient ischemic attack (TIA), and cerebral infarction without residual deficits: Secondary | ICD-10-CM | POA: Diagnosis not present

## 2021-07-26 DIAGNOSIS — Z90411 Acquired partial absence of pancreas: Secondary | ICD-10-CM | POA: Diagnosis not present

## 2021-07-26 DIAGNOSIS — J189 Pneumonia, unspecified organism: Secondary | ICD-10-CM

## 2021-07-26 DIAGNOSIS — Z803 Family history of malignant neoplasm of breast: Secondary | ICD-10-CM

## 2021-07-26 DIAGNOSIS — Z9049 Acquired absence of other specified parts of digestive tract: Secondary | ICD-10-CM

## 2021-07-26 DIAGNOSIS — R6520 Severe sepsis without septic shock: Secondary | ICD-10-CM | POA: Diagnosis present

## 2021-07-26 DIAGNOSIS — J440 Chronic obstructive pulmonary disease with acute lower respiratory infection: Secondary | ICD-10-CM | POA: Diagnosis present

## 2021-07-26 DIAGNOSIS — I10 Essential (primary) hypertension: Secondary | ICD-10-CM | POA: Diagnosis present

## 2021-07-26 DIAGNOSIS — Z794 Long term (current) use of insulin: Secondary | ICD-10-CM

## 2021-07-26 DIAGNOSIS — R569 Unspecified convulsions: Secondary | ICD-10-CM

## 2021-07-26 DIAGNOSIS — J690 Pneumonitis due to inhalation of food and vomit: Secondary | ICD-10-CM | POA: Diagnosis present

## 2021-07-26 DIAGNOSIS — R4182 Altered mental status, unspecified: Principal | ICD-10-CM

## 2021-07-26 DIAGNOSIS — Z87891 Personal history of nicotine dependence: Secondary | ICD-10-CM

## 2021-07-26 DIAGNOSIS — Z9081 Acquired absence of spleen: Secondary | ICD-10-CM | POA: Diagnosis not present

## 2021-07-26 DIAGNOSIS — G40909 Epilepsy, unspecified, not intractable, without status epilepticus: Secondary | ICD-10-CM | POA: Diagnosis present

## 2021-07-26 DIAGNOSIS — E782 Mixed hyperlipidemia: Secondary | ICD-10-CM

## 2021-07-26 DIAGNOSIS — Z79899 Other long term (current) drug therapy: Secondary | ICD-10-CM

## 2021-07-26 DIAGNOSIS — E119 Type 2 diabetes mellitus without complications: Secondary | ICD-10-CM | POA: Diagnosis present

## 2021-07-26 LAB — BASIC METABOLIC PANEL
Anion gap: 14 (ref 5–15)
BUN: 52 mg/dL — ABNORMAL HIGH (ref 8–23)
CO2: 19 mmol/L — ABNORMAL LOW (ref 22–32)
Calcium: 8.4 mg/dL — ABNORMAL LOW (ref 8.9–10.3)
Chloride: 94 mmol/L — ABNORMAL LOW (ref 98–111)
Creatinine, Ser: 1.04 mg/dL — ABNORMAL HIGH (ref 0.44–1.00)
GFR, Estimated: >60 mL/min (ref >60)
Glucose, Bld: 441 mg/dL — ABNORMAL HIGH (ref 70–99)
Potassium: 3.8 mmol/L (ref 3.5–5.1)
Sodium: 127 mmol/L — ABNORMAL LOW (ref 135–145)

## 2021-07-26 LAB — CBC WITH DIFFERENTIAL/PLATELET
Abs Immature Granulocytes: 0.06 10*3/uL (ref 0.00–0.07)
Basophils Absolute: 0 10*3/uL (ref 0.0–0.1)
Basophils Relative: 0 %
Eosinophils Absolute: 0 10*3/uL (ref 0.0–0.5)
Eosinophils Relative: 0 %
HCT: 46 % (ref 36.0–46.0)
Hemoglobin: 14.3 g/dL (ref 12.0–15.0)
Immature Granulocytes: 1 %
Lymphocytes Relative: 11 %
Lymphs Abs: 1.3 10*3/uL (ref 0.7–4.0)
MCH: 27.4 pg (ref 26.0–34.0)
MCHC: 31.1 g/dL (ref 30.0–36.0)
MCV: 88.1 fL (ref 80.0–100.0)
Monocytes Absolute: 0.5 10*3/uL (ref 0.1–1.0)
Monocytes Relative: 4 %
Neutro Abs: 9.7 10*3/uL — ABNORMAL HIGH (ref 1.7–7.7)
Neutrophils Relative %: 84 %
Platelets: 440 10*3/uL — ABNORMAL HIGH (ref 150–400)
RBC: 5.22 MIL/uL — ABNORMAL HIGH (ref 3.87–5.11)
RDW: 15.7 % — ABNORMAL HIGH (ref 11.5–15.5)
WBC: 11.6 10*3/uL — ABNORMAL HIGH (ref 4.0–10.5)
nRBC: 0 % (ref 0.0–0.2)

## 2021-07-26 LAB — COMPREHENSIVE METABOLIC PANEL
ALT: 38 U/L (ref 0–44)
AST: 22 U/L (ref 15–41)
Albumin: 3.3 g/dL — ABNORMAL LOW (ref 3.5–5.0)
Alkaline Phosphatase: 126 U/L (ref 38–126)
Anion gap: 19 — ABNORMAL HIGH (ref 5–15)
BUN: 70 mg/dL — ABNORMAL HIGH (ref 8–23)
CO2: 14 mmol/L — ABNORMAL LOW (ref 22–32)
Calcium: 9.3 mg/dL (ref 8.9–10.3)
Chloride: 84 mmol/L — ABNORMAL LOW (ref 98–111)
Creatinine, Ser: 1.33 mg/dL — ABNORMAL HIGH (ref 0.44–1.00)
GFR, Estimated: 46 mL/min — ABNORMAL LOW (ref >60)
Glucose, Bld: 736 mg/dL (ref 70–99)
Potassium: 5.3 mmol/L — ABNORMAL HIGH (ref 3.5–5.1)
Sodium: 117 mmol/L — CL (ref 135–145)
Total Bilirubin: 1.5 mg/dL — ABNORMAL HIGH (ref 0.3–1.2)
Total Protein: 9 g/dL — ABNORMAL HIGH (ref 6.5–8.1)

## 2021-07-26 LAB — URINALYSIS, ROUTINE W REFLEX MICROSCOPIC
Bilirubin Urine: NEGATIVE
Glucose, UA: >=500 mg/dL — AB
Hgb urine dipstick: NEGATIVE
Ketones, ur: 5 mg/dL — AB
Leukocytes,Ua: NEGATIVE
Nitrite: NEGATIVE
Protein, ur: NEGATIVE mg/dL
Specific Gravity, Urine: 1.022 (ref 1.005–1.030)
pH: 5 (ref 5.0–8.0)

## 2021-07-26 LAB — BLOOD GAS, VENOUS
Acid-base deficit: 14.4 mmol/L — ABNORMAL HIGH (ref 0.0–2.0)
Bicarbonate: 13.5 mmol/L — ABNORMAL LOW (ref 20.0–28.0)
O2 Saturation: 68.7 %
Patient temperature: 37
pCO2, Ven: 38 mmHg — ABNORMAL LOW (ref 44–60)
pH, Ven: 7.16 — CL (ref 7.25–7.43)
pO2, Ven: 45 mmHg (ref 32–45)

## 2021-07-26 LAB — GLUCOSE, CAPILLARY
Glucose-Capillary: 307 mg/dL — ABNORMAL HIGH (ref 70–99)
Glucose-Capillary: 312 mg/dL — ABNORMAL HIGH (ref 70–99)
Glucose-Capillary: 320 mg/dL — ABNORMAL HIGH (ref 70–99)
Glucose-Capillary: 436 mg/dL — ABNORMAL HIGH (ref 70–99)
Glucose-Capillary: 469 mg/dL — ABNORMAL HIGH (ref 70–99)
Glucose-Capillary: 472 mg/dL — ABNORMAL HIGH (ref 70–99)
Glucose-Capillary: 542 mg/dL (ref 70–99)
Glucose-Capillary: >600 mg/dL (ref 70–99)

## 2021-07-26 LAB — TROPONIN I (HIGH SENSITIVITY)
Troponin I (High Sensitivity): 16 ng/L (ref <18)
Troponin I (High Sensitivity): 17 ng/L (ref <18)

## 2021-07-26 LAB — MRSA NEXT GEN BY PCR, NASAL: MRSA by PCR Next Gen: NOT DETECTED

## 2021-07-26 LAB — PROCALCITONIN: Procalcitonin: 0.25 ng/mL

## 2021-07-26 LAB — CBG MONITORING, ED: Glucose-Capillary: >600 mg/dL (ref 70–99)

## 2021-07-26 LAB — PROTIME-INR
INR: 1 (ref 0.8–1.2)
Prothrombin Time: 13.4 seconds (ref 11.4–15.2)

## 2021-07-26 LAB — TSH: TSH: 2.191 u[IU]/mL (ref 0.350–4.500)

## 2021-07-26 LAB — LACTIC ACID, PLASMA
Lactic Acid, Venous: 2.6 mmol/L (ref 0.5–1.9)
Lactic Acid, Venous: 2.5 mmol/L (ref 0.5–1.9)

## 2021-07-26 LAB — MAGNESIUM: Magnesium: 1.9 mg/dL (ref 1.7–2.4)

## 2021-07-26 LAB — BETA-HYDROXYBUTYRIC ACID
Beta-Hydroxybutyric Acid: 6.42 mmol/L — ABNORMAL HIGH (ref 0.05–0.27)
Beta-Hydroxybutyric Acid: 3.45 mmol/L — ABNORMAL HIGH (ref 0.05–0.27)

## 2021-07-26 MED ORDER — LACTATED RINGERS IV SOLN: INTRAVENOUS | Status: DC

## 2021-07-26 MED ORDER — ENOXAPARIN SODIUM 40 MG/0.4ML IJ SOSY
40.0000 mg | PREFILLED_SYRINGE | INTRAMUSCULAR | Status: DC
  Administered 2021-07-26 – 2021-07-27 (×2): 40 mg via SUBCUTANEOUS
  Filled 2021-07-26 (×2): qty 0.4

## 2021-07-26 MED ORDER — SODIUM CHLORIDE 0.9 % IV SOLN
500.0000 mg | INTRAVENOUS | Status: DC
  Administered 2021-07-26 – 2021-07-27 (×2): 500 mg via INTRAVENOUS
  Filled 2021-07-26 (×2): qty 500
  Filled 2021-07-26: qty 5

## 2021-07-26 MED ORDER — ASPIRIN 81 MG PO TBEC
81.0000 mg | DELAYED_RELEASE_TABLET | Freq: Every day | ORAL | Status: DC
  Administered 2021-07-27 – 2021-07-28 (×2): 81 mg via ORAL
  Filled 2021-07-26 (×2): qty 1

## 2021-07-26 MED ORDER — ORAL CARE MOUTH RINSE: 15.0000 mL | OROMUCOSAL | Status: DC

## 2021-07-26 MED ORDER — DEXTROSE IN LACTATED RINGERS 5 % IV SOLN: INTRAVENOUS | Status: DC

## 2021-07-26 MED ORDER — MELATONIN 5 MG PO TABS
5.0000 mg | ORAL_TABLET | Freq: Every evening | ORAL | Status: DC | PRN
Start: 2021-07-26 — End: 2021-07-28

## 2021-07-26 MED ORDER — LACTATED RINGERS IV BOLUS
1000.0000 mL | Freq: Once | INTRAVENOUS | Status: AC
  Administered 2021-07-26: 1000 mL via INTRAVENOUS

## 2021-07-26 MED ORDER — ORAL CARE MOUTH RINSE: 15.0000 mL | OROMUCOSAL | Status: DC | PRN

## 2021-07-26 MED ORDER — ROPINIROLE HCL 1 MG PO TABS
0.5000 mg | ORAL_TABLET | Freq: Every day | ORAL | Status: DC
  Administered 2021-07-27: 0.5 mg via ORAL
  Filled 2021-07-26 (×2): qty 2

## 2021-07-26 MED ORDER — ORAL CARE MOUTH RINSE
15.0000 mL | OROMUCOSAL | Status: DC | PRN
Start: 2021-07-26 — End: 2021-07-28

## 2021-07-26 MED ORDER — SODIUM CHLORIDE 0.9 % IV SOLN
3.0000 g | Freq: Four times a day (QID) | INTRAVENOUS | Status: DC
  Administered 2021-07-26 – 2021-07-28 (×7): 3 g via INTRAVENOUS
  Filled 2021-07-26: qty 3
  Filled 2021-07-26: qty 8
  Filled 2021-07-26 (×4): qty 3
  Filled 2021-07-26: qty 8
  Filled 2021-07-26 (×2): qty 3

## 2021-07-26 MED ORDER — METRONIDAZOLE 500 MG/100ML IV SOLN
500.0000 mg | Freq: Once | INTRAVENOUS | Status: AC
  Administered 2021-07-26: 500 mg via INTRAVENOUS
  Filled 2021-07-26: qty 100

## 2021-07-26 MED ORDER — SODIUM CHLORIDE 0.9 % IV SOLN
2.0000 g | Freq: Once | INTRAVENOUS | Status: AC
  Administered 2021-07-26: 2 g via INTRAVENOUS

## 2021-07-26 MED ORDER — LACTATED RINGERS IV BOLUS: 20.0000 mL/kg | Freq: Once | INTRAVENOUS | Status: DC

## 2021-07-26 MED ORDER — DEXTROSE 50 % IV SOLN: 0.0000 mL | INTRAVENOUS | Status: DC | PRN

## 2021-07-26 MED ORDER — VANCOMYCIN HCL IN DEXTROSE 1-5 GM/200ML-% IV SOLN
1000.0000 mg | Freq: Once | INTRAVENOUS | Status: AC
  Administered 2021-07-26: 1000 mg via INTRAVENOUS
  Filled 2021-07-26: qty 200

## 2021-07-26 MED ORDER — NICOTINE 14 MG/24HR TD PT24: 14.0000 mg | MEDICATED_PATCH | Freq: Every day | TRANSDERMAL | Status: DC | PRN

## 2021-07-26 MED ORDER — INSULIN REGULAR(HUMAN) IN NACL 100-0.9 UT/100ML-% IV SOLN: INTRAVENOUS | Status: DC

## 2021-07-26 MED ORDER — ONDANSETRON HCL 4 MG/2ML IJ SOLN: 4.0000 mg | Freq: Four times a day (QID) | INTRAMUSCULAR | Status: DC | PRN

## 2021-07-26 MED ORDER — OLANZAPINE 7.5 MG PO TABS
7.5000 mg | ORAL_TABLET | Freq: Every day | ORAL | Status: DC
  Administered 2021-07-27: 7.5 mg via ORAL
  Filled 2021-07-26 (×2): qty 1

## 2021-07-26 MED ORDER — MIRTAZAPINE 15 MG PO TABS
15.0000 mg | ORAL_TABLET | Freq: Every day | ORAL | Status: DC
  Administered 2021-07-27: 15 mg via ORAL
  Filled 2021-07-26: qty 1

## 2021-07-26 MED ORDER — CAMPHOR-MENTHOL 0.5-0.5 % EX LOTN: 1.0000 "application " | TOPICAL_LOTION | CUTANEOUS | Status: DC | PRN

## 2021-07-26 MED ORDER — ALBUTEROL SULFATE (2.5 MG/3ML) 0.083% IN NEBU: 3.0000 mL | INHALATION_SOLUTION | Freq: Four times a day (QID) | RESPIRATORY_TRACT | Status: DC | PRN

## 2021-07-26 MED ORDER — CHLORHEXIDINE GLUCONATE CLOTH 2 % EX PADS
6.0000 | MEDICATED_PAD | Freq: Every day | CUTANEOUS | Status: DC
Start: 2021-07-27 — End: 2021-07-28
  Administered 2021-07-26 – 2021-07-28 (×4): 6 via TOPICAL

## 2021-07-26 MED ORDER — SODIUM BICARBONATE 8.4 % IV SOLN
50.0000 meq | Freq: Once | INTRAVENOUS | Status: AC
  Administered 2021-07-26: 50 meq via INTRAVENOUS
  Filled 2021-07-26: qty 50

## 2021-07-26 MED ORDER — ATORVASTATIN CALCIUM 20 MG PO TABS
40.0000 mg | ORAL_TABLET | Freq: Every day | ORAL | Status: DC
  Administered 2021-07-27: 40 mg via ORAL
  Filled 2021-07-26: qty 2

## 2021-07-26 MED ORDER — LACTATED RINGERS IV BOLUS
20.0000 mL/kg | Freq: Once | INTRAVENOUS | Status: AC
  Administered 2021-07-26: 952 mL via INTRAVENOUS

## 2021-07-26 MED ORDER — INSULIN REGULAR(HUMAN) IN NACL 100-0.9 UT/100ML-% IV SOLN
INTRAVENOUS | Status: DC
  Administered 2021-07-26: 6 [IU]/h via INTRAVENOUS
  Filled 2021-07-26: qty 100

## 2021-07-26 NOTE — Assessment & Plan Note (Signed)
-   As needed nicotine patch ordered ?

## 2021-07-26 NOTE — ED Triage Notes (Signed)
Pt to ED [facility] for AMS (more than baseline) and generalized body pains, mostly in chest and abdomen. Pt has colostomy, hx dementia. Has not eaten, was in bed all day yesterday per daughter report to EMS  EMS VS: 12 lead a fib, 80-100 99/71 94% on RA CBG read "high" on glucometer, unable to get temperature Pt threw up once en route. EMS attempted IV 4 times  Hx DM, seizures, stroke, dementia. EMS unsure if pt compliant with DM meds. Hx R sided deficits and dysarthria from prior stroke  Pt is moaning and groaning in recliner. Unable to get oral or axillary temp. Rectal temp 92.2. CBG reading "HI" on ED glucometer.

## 2021-07-26 NOTE — ED Notes (Signed)
Provider at bedside. Pt disoriented to year.

## 2021-07-26 NOTE — Progress Notes (Signed)
Patient accepted from ED to ICU bed 1.  Upon arrival patient confused.  Patient settled into room. Rectal probe placed to monitor temperature.Bair hugger applied.  3 bolus of LR hung, Insulin drip started per order.

## 2021-07-26 NOTE — ED Notes (Signed)
Attempted IV access x2 sticks and used the [place] for insertion, unable to start IV at this time. IV consult placed

## 2021-07-26 NOTE — H&P (Addendum)
History and Physical   EDELIN [name] ESP:[id number] DOB: 30-Jan-1961 DOA: 07/26/2021  PCP: Merryl Hacker, No  Outpatient Specialists: Dr. Olean Ree, general surgery  I have personally briefly reviewed patient's old medical records in [place].  Chief Concern: Altered mental status  HPI: [name], is a 61 year old female with history of hypertension, poorly controlled insulin-dependent diabetes mellitus, history of left PCA territory infarct, right hemiparesis, COPD, history of pancreectomy/splenectomy, history of diverticulitis with abscess leading to colostomy in January 2023, anxiety, depression, who presents emergency department for chief concerns of altered mental status, generalized body pains.  Initial vitals in the emergency department showed temperature of 92.2,Improved to 95.7, rectally, respiration rate of 13, heart rate 75, blood pressure 104/82, SPO2 of 100% on room air.  Serum sodium 117, potassium 5.3, chloride 84, bicarb 14, BUN of 70, serum creatinine of 1.33, nonfasting blood glucose 736, anion gap was elevated at 19, GFR 46.  Lactic acid elevated 2.6 and decreased to 2.5.  WBC elevated 11.6, hemoglobin 14.3, platelets of 440.  High since he troponin was 16 and on repeat was 17.  ED treatment: Insulin GTT, cefepime, LR 2 L bolus, vancomycin and metronidazole.  At bedside patient was able to tell me her name.  She was not able to tell me the current calendar year or her age.  She appears calm and does not appear to be in acute distress.  Her daughter states that her mother at baseline complains of pain everywhere.  However today she complained of having her belly pain and her chest pain therefore daughter was concerned and called EMS.  Patient stated to daughter that she has been vomiting however daughter was not able to find evidence of vomitus in the room.  Her daughter administers patient's insulin however patient eats what ever she wants and is not compliant with a  diabetic diet.  Social history: She lives at home with her daughter  ROS: Not able to complete as patient is altered  ED Course: Discussed with emergency medicine provider, patient requiring hospitalization for chief concerns of DKA.  Assessment/Plan  Principal Problem:   DKA (diabetic ketoacidosis) ([place]) Active Problems:   Major depressive disorder, recurrent episode, moderate (HCC)   Type 2 diabetes mellitus with hyperlipidemia (HCC)   Seizure disorder (HCC)   Restless leg syndrome   History of stroke   Status post colostomy (HCC)   Protein-calorie malnutrition, severe   Tobacco dependence   Malnutrition of moderate degree   Hypothermia   HLD (hyperlipidemia)   Tobacco abuse   High anion gap metabolic acidosis   Severe sepsis (HCC)   CAP (community acquired pneumonia)   Assessment and Plan:  * DKA (diabetic ketoacidosis) ([place]) - Admit to inpatient, stepdown - Presumed secondary to diabetic diet noncompliance and pneumonia - Continue treatment with DKA protocol including insulin GTT, aggressive fluid management - N.p.o. - Repeat BMP every 4 hours and beta hydroxybutyrate every 8 hours  Type 2 diabetes mellitus with hyperlipidemia ([place]) - Holding home subcutaneous insulin at this time: Lantus 10 units nightly, insulin lispro 5 units 3 times daily with meals  History of stroke - Resumed aspirin 81 mg daily  Status post colostomy ([place]) - Colostomy bag in place - Wound/ostomy nurse consulted  CAP (community acquired pneumonia) - Query aspiration - Check procalcitonin - Azithromycin and Unasyn ordered  Severe sepsis ([place]) - Patient maintaining appropriate MAP - Presumed secondary to pneumonia, query aspiration - Patient meets criteria for severe sepsis in setting of DKA,  leukocytosis, elevated lactic acid, hypothermia - Patient is maintaining appropriate MAP - Check procalcitonin on admission and for 2 days to assess antibiotic treatment effectiveness - Blood  cultures x2 ordered, check MRSA PCR, check a UA - Azithromycin 500 mg IV, 5 days ordered, Unasyn per pharmacy ordered - Aspiration precaution  High anion gap metabolic acidosis - Presumed secondary to DKA - Treat as above  Tobacco abuse - As needed nicotine patch ordered  HLD (hyperlipidemia) - Atorvastatin 40 mg nightly  Hypothermia - Check TSH - Temperature check every hour, for 6 hours ordered, or until temperature has normalized  Chart reviewed.   DVT prophylaxis: Enoxaparin Code Status: Full code Diet: N.p.o. Family Communication: Updated daughter, 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 Her smoking use included cigarettes. She has been exposed to tobacco smoke. She has never used smokeless tobacco. She reports current alcohol use. She reports that she does not currently use drugs.  Allergies  Allergen Reactions   Cephalosporins Itching    TOLERATED ZOSYN (PIPERACILLIN) BEFORE   Hydromorphone Itching   Keflin [Cephalothin] Itching   Lactose Intolerance (Gi) Diarrhea   Family History  Problem Relation Age of Onset   Anxiety disorder Sister     Breast cancer Maternal Aunt    Family history: Family history reviewed and not pertinent.  Prior to Admission medications   Medication Sig Start Date End Date Taking? Authorizing Provider  albuterol (VENTOLIN HFA) 108 (90 Base) MCG/ACT inhaler Inhale 2 puffs into the lungs every 6 (six) hours as needed for wheezing or shortness of breath. 05/23/21   Pokhrel, Corrie Mckusick, MD  aspirin 81 MG EC tablet Take 1 tablet (81 mg total) by mouth daily. Swallow whole. 05/23/21   Pokhrel, Corrie Mckusick, MD  atorvastatin (LIPITOR) 40 MG tablet Take 1 tablet (40 mg total) by mouth Nightly. Patient taking differently: Take 40 mg by mouth at bedtime. 03/14/21 07/22/21  Fritzi Mandes, MD  camphor-menthol [facility]) lotion Apply topically as needed for itching. Patient taking differently: Apply 1 application. topically as needed for itching. 05/09/21   Hosie Poisson, MD  diclofenac Sodium (VOLTAREN) 1 % GEL Apply 2 g topically 4 (four) times daily as needed. 01/09/21   [provider]  insulin lispro (HUMALOG KWIKPEN) 100 UNIT/ML KwikPen Inject 5 Units into the skin 3 (three) times daily with meals. 06/27/21 09/25/21  Enzo Bi, MD  Insulin Pen Needle 32G X 4 MM MISC 1 Units by Does not apply route 3 (three) times daily. 06/27/21 09/25/21  Enzo Bi, MD  LANTUS SOLOSTAR 100 UNIT/ML Solostar Pen SMARTSIG:40 Unit(s) SUB-Q Every Night    [provider]  melatonin 3 MG TABS  tablet Take 2 tablets (6 mg total) by mouth at bedtime as needed. Home med. 06/27/21   Enzo Bi, MD  mirtazapine (REMERON) 15 MG tablet Take 15 mg by mouth at bedtime.    [provider]  Multiple Vitamin (MULTIVITAMIN WITH MINERALS) TABS tablet Take 1 tablet by mouth daily. 05/10/21   Hosie Poisson, MD  OLANZapine (ZYPREXA) 7.5 MG tablet Take 1 tablet (7.5 mg total) by mouth at bedtime. 06/27/21 07/27/21  Enzo Bi, MD  Pancrelipase, Lip-Prot-Amyl, 24000-76000 units CPEP Take 3 capsules (72,000 Units total) by mouth 3 (three) times daily.  12/27/20   Loletha Grayer, MD  polyethylene glycol (MIRALAX / GLYCOLAX) 17 g packet Place 17 g into feeding tube daily as needed for mild constipation. 05/09/21   Hosie Poisson, MD  rOPINIRole (REQUIP) 0.5 MG tablet Take 0.5 mg by mouth at bedtime. 06/29/21   [provider]  rOPINIRole (REQUIP) 1 MG tablet Take 1 tablet (1 mg total) by mouth at bedtime. Home med. 06/27/21   Enzo Bi, MD   Physical Exam: Vitals:   07/26/21 1630 07/26/21 1730 07/26/21 1745 07/26/21 1845  BP: 113/77 105/73  90/60  Pulse: 80 83  94  Resp: 16 20  (!) 22  Temp:   (!) 95.7 F (35.4 C) (!) 96.1 F (35.6 C)  TempSrc:   Rectal Rectal  SpO2: 100% 100%  100%  Weight:    41.6 kg  Height:    '5\' 3"'$  (1.6 m)   Constitutional: appears older than chronological age, frail, cachectic, NAD, calm, comfortable Eyes: PERRL, lids and conjunctivae normal ENMT: Mucous membranes are moist. Posterior pharynx clear of any exudate or lesions. Age-appropriate dentition. Hearing appropriate Neck: normal, supple, no masses, no thyromegaly Respiratory: clear to auscultation bilaterally, no wheezing, no crackles. Normal respiratory effort. No accessory muscle use.  Cardiovascular: Regular rate and rhythm, no murmurs / rubs / gallops. No extremity edema. 2+ pedal pulses. No carotid bruits.  Abdomen: no tenderness, no masses palpated, no hepatosplenomegaly. Bowel sounds positive.  Musculoskeletal: no clubbing / cyanosis. No joint deformity upper and lower extremities. Good ROM, no contractures, no atrophy. Normal muscle tone.  Skin: no rashes, lesions, ulcers. No induration Neurologic: Sensation intact. Strength 5/5 in left upper and lower extremities.  Strength is 4 out of 5 in right upper and lower extremities. Psychiatric: Normal judgment and insight. Alert and oriented x self. Normal mood.   EKG: independently reviewed, showing sinus rhythm with rate of 77, QTc 459  Chest x-ray on Admission: I personally reviewed and I agree  with radiologist reading as below.  CT ABDOMEN PELVIS WO CONTRAST  Result Date: 07/26/2021 CLINICAL DATA:  Abdominal pain EXAM: CT ABDOMEN AND PELVIS WITHOUT CONTRAST TECHNIQUE: Multidetector CT imaging of the abdomen and pelvis was performed following the standard protocol without IV contrast. RADIATION DOSE REDUCTION: This exam was performed according to the departmental dose-optimization program which includes automated exposure control, adjustment of the mA and/or kV according to patient size and/or use of iterative reconstruction technique. COMPARISON:  CT chest abdomen pelvis, 04/28/2021 FINDINGS: Lower chest: Extensive heterogeneous ground-glass and consolidative airspace opacity throughout the lung bases (series 5, image 12). Coronary artery calcifications. Hepatobiliary: No focal liver abnormality is seen. Status post cholecystectomy. No biliary dilatation. 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 Status post distal pancreatectomy. Spleen: Status post splenectomy. Adrenals/Urinary Tract: Adrenal glands are  unremarkable. Kidneys are normal, without renal calculi, solid lesion, or hydronephrosis. Bladder is unremarkable. Stomach/Bowel: Stomach is within normal limits. Appendix appears normal. No evidence of bowel wall thickening, distention, or inflammatory changes. Left lower quadrant end colostomy with sigmoid colon resection and rectal stump. Vascular/Lymphatic: Aortic atherosclerosis. No enlarged abdominal or pelvic lymph nodes. Reproductive: No mass or other significant abnormality. Other: No abdominal wall hernia or abnormality. No ascites. Musculoskeletal: No acute or significant osseous findings. IMPRESSION: 1. Extensive heterogeneous ground-glass and consolidative airspace opacity throughout the lung bases, consistent with infection or  aspiration. 2. 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 3. Status post splenectomy and distal pancreatectomy. 4. Status post sigmoid colon resection with left lower quadrant end colostomy. 5. Coronary artery disease. Aortic Atherosclerosis (ICD10-I70.0). Electronically Signed   By: Delanna Ahmadi M.D.   On: 07/26/2021 17:20   CT Head Wo Contrast  Result Date: 07/26/2021 CLINICAL DATA:  Altered mental status EXAM: CT HEAD WITHOUT CONTRAST TECHNIQUE: Contiguous axial images were obtained from the base of the skull through the vertex without intravenous contrast. RADIATION DOSE REDUCTION: This exam was performed according to the departmental dose-optimization program which includes automated exposure control, adjustment of the mA and/or kV according to patient size and/or use of iterative reconstruction technique. COMPARISON:  Head CT 06/25/2021 FINDINGS: Brain: Large remote infarction in the LEFT cerebellum and occipital lobe. No evidence of acute infarction. Extensive subcortical white matter hypodensities. No midline shift or mass effect. No intracranial hemorrhage. Basal cisterns are patent. There are periventricular and subcortical white matter hypodensities. Generalized cortical atrophy. Vascular: No hyperdense vessel or unexpected calcification. Skull: Normal. Negative for fracture or focal lesion. Sinuses/Orbits: Paranasal sinuses and mastoid air cells are clear. Orbits are clear. Other: None. IMPRESSION: 1. No acute intracranial findings. 2. Large LEFT PCA territory infarction unchanged from 06/25/2021 Electronically Signed   By: Suzy Bouchard M.D.   On: 07/26/2021 17:18   DG Chest Port 1 View  Result Date: 07/26/2021 CLINICAL DATA:  61 year old female presenting for evaluation of chest and abdomen pain. EXAM: PORTABLE CHEST 1 VIEW COMPARISON:  Jun 25, 2021. FINDINGS: EKG leads project  over the chest. Trachea is midline. Cardiomediastinal contours and hilar structures are normal. Bibasilar airspace disease similar in the RIGHT lower lobe and increased on the LEFT with nodular appearance more so on the RIGHT than the LEFT. No pneumothorax. No gross evidence of pleural effusion. On limited assessment there is no acute skeletal process. IMPRESSION: Findings suspicious for basilar pneumonia now seen bilaterally. Correlate with any risk factors for or history of aspiration based on distribution. More nodular appearance in the RIGHT lower chest persists. Suggest follow-up to ensure clearing and exclude persistent lung nodule. Electronically Signed   By: Zetta Bills M.D.   On: 07/26/2021 14:34    Labs on Admission: I have personally reviewed following labs  CBC: Recent Labs  Lab 07/26/21 1453  WBC 11.6*  NEUTROABS 9.7*  HGB 14.3  HCT 46.0  MCV 88.1  PLT 185*   Basic Metabolic Panel: Recent Labs  Lab 07/26/21 1557  NA 117*  K 5.3*  CL 84*  CO2 14*  GLUCOSE 736*  BUN 70*  CREATININE 1.33*  CALCIUM 9.3  MG 1.9   GFR: Estimated Creatinine Clearance: 29.2 mL/min (A) (by C-G formula based on SCr of 1.33 mg/dL (H)).  Liver Function Tests: Recent Labs  Lab 07/26/21 1557  AST 22  ALT 38  ALKPHOS 126  BILITOT 1.5*  PROT 9.0*  ALBUMIN 3.3*   Coagulation Profile: Recent Labs  Lab 07/26/21 1453  INR 1.0   CBG: Recent Labs  Lab 07/26/21 1339 07/26/21 1856  GLUCAP >600* >600*   Urine analysis:    Component Value Date/Time   COLORURINE YELLOW (A) 07/26/2021 1807   APPEARANCEUR HAZY (A) 07/26/2021 1807   APPEARANCEUR Hazy 04/04/2011 1935   LABSPEC 1.022 07/26/2021 1807   LABSPEC >1.060 04/04/2011 1935   PHURINE 5.0 07/26/2021 1807   GLUCOSEU >=500 (A) 07/26/2021 1807   GLUCOSEU 50 mg/dL 04/04/2011 1935   HGBUR NEGATIVE 07/26/2021 1807   BILIRUBINUR NEGATIVE 07/26/2021 1807   BILIRUBINUR Negative 04/04/2011 1935   KETONESUR 5 (A) 07/26/2021 1807    PROTEINUR NEGATIVE 07/26/2021 1807   NITRITE NEGATIVE 07/26/2021 1807   LEUKOCYTESUR NEGATIVE 07/26/2021 1807   LEUKOCYTESUR Negative 04/04/2011 1935   CRITICAL CARE Performed by: Briant Cedar Yanett Conkright   Total critical care time: 40 minutes  Critical care time was exclusive of separately billable procedures and treating other patients.  Critical care was necessary to treat or prevent imminent or life-threatening deterioration.  Endocrine crisis, severe sepsis, hypothermia  Critical care was time spent personally by me on the following activities: development of treatment plan with patient and/or surrogate as well as nursing, discussions with consultants, evaluation of patient's response to treatment, examination of patient, obtaining history from patient or surrogate, ordering and performing treatments and interventions, ordering and review of laboratory studies, ordering and review of radiographic studies, pulse oximetry and re-evaluation of patient's condition.  Dr. Tobie Poet [facility]  If 7PM-7AM, please contact overnight-coverage provider If 7AM-7PM, please contact day coverage provider [email or web address]  07/26/2021, 7:33 PM

## 2021-07-26 NOTE — ED Notes (Signed)
Attempted to call ICU for report. Nurse was unavailable but will call back when available.

## 2021-07-26 NOTE — Assessment & Plan Note (Signed)
-   Check TSH - Temperature check every hour, for 6 hours ordered, or until temperature has normalized

## 2021-07-26 NOTE — ED Notes (Signed)
PT vomitting

## 2021-07-26 NOTE — ED Notes (Signed)
PT IV infiltrated at this time. MD Starleen Blue aware

## 2021-07-26 NOTE — Assessment & Plan Note (Signed)
-   Query aspiration - Check procalcitonin - Azithromycin and Unasyn ordered

## 2021-07-26 NOTE — Assessment & Plan Note (Addendum)
-   Colostomy bag in place - Wound/ostomy nurse consulted

## 2021-07-26 NOTE — Hospital Course (Signed)
[name], is a 61 year old female with history of hypertension, poorly controlled insulin-dependent diabetes mellitus, history of left PCA territory infarct, right hemiparesis, COPD, history of pancreectomy/splenectomy, history of diverticulitis with abscess leading to colostomy in January 2023, anxiety, depression, who presents emergency department for chief concerns of altered mental status, generalized body pains.  Initial vitals in the emergency department showed temperature of 92.2,Improved to 95.7, rectally, respiration rate of 13, heart rate 75, blood pressure 104/82, SPO2 of 100% on room air.  Serum sodium 117, potassium 5.3, chloride 84, bicarb 14, BUN of 70, serum creatinine of 1.33, nonfasting blood glucose 736, anion gap was elevated at 19, GFR 46.  Lactic acid elevated 2.6 and decreased to 2.5.  WBC elevated 11.6, hemoglobin 14.3, platelets of 440.  High since he troponin was 16 and on repeat was 17.  ED treatment: Insulin GTT, cefepime, LR 2 L bolus, vancomycin and metronidazole.

## 2021-07-26 NOTE — Assessment & Plan Note (Addendum)
-   Holding home subcutaneous insulin at this time: Lantus 10 units nightly, insulin lispro 5 units 3 times daily with meals

## 2021-07-26 NOTE — ED Notes (Signed)
Pt placed on bear hugger warmer

## 2021-07-26 NOTE — ED Provider Notes (Signed)
Patient signed out to me at 3 PM pending labs and admission.  She is a 61 year old female history of diabetes presenting with altered mental status hypoglycemia.  She is hypothermic mildly hypotensive and blood sugars over 600.  Chest x-ray looks like possible basilar pneumonia she is being covered with cefepime vancomycin pending fluids labs.  Patient's labs are consistent with DKA with an elevated beta hydroxybutyrate low bicarb and anion gap of 17 pH 716.  Lactate is 2.5.  She has an AKI as well.  Potassium is 5.3.  She is receiving second liter of LR we will start insulin drip.  Head does not have any acute findings.  CT abdomen pelvis without any acute process in the abdomen.  Will discuss with hospitalist for admission.   Rada Hay, MD 07/26/21 [id number]

## 2021-07-26 NOTE — Progress Notes (Signed)
Pharmacy Antibiotic Note  Brittany [name] is a 61 y.o. female admitted on 07/26/2021 with  asp. pneumonia.  Pharmacy has been consulted for Unasyn dosing.  Plan: Unasyn 3 gm IV q6h    Height: '5\' 3"'$  (160 cm) Weight: 47.6 kg (105 lb) IBW/kg (Calculated) : 52.4  Temp (24hrs), Avg:94 F (34.4 C), Min:92.2 F (33.4 C), Max:95.7 F (35.4 C)  Recent Labs  Lab 07/26/21 1453 07/26/21 1557  WBC 11.6*  --   CREATININE  --  1.33*  LATICACIDVEN 2.6* 2.5*    Estimated Creatinine Clearance: 33.4 mL/min (A) (by C-G formula based on SCr of 1.33 mg/dL (H)).    Allergies  Allergen Reactions   Cephalosporins Itching    TOLERATED ZOSYN (PIPERACILLIN) BEFORE   Hydromorphone Itching   Keflin [Cephalothin] Itching   Lactose Intolerance (Gi) Diarrhea    Antimicrobials this admission: Cefepime/vanc/metro 6/14  azith 6/14 >>    x5d Unasyn 6/14>>  Dose adjustments this admission:    Microbiology results: 6/14 BCx: pend   UCx:      Sputum:      MRSA PCR:    Thank you for allowing pharmacy to be a part of this patient's care.  Teofil Maniaci A 07/26/2021 6:19 PM

## 2021-07-26 NOTE — Assessment & Plan Note (Signed)
-   Resumed aspirin 81 mg daily

## 2021-07-26 NOTE — Sepsis Progress Note (Signed)
eLink is following this Code Sepsis. °

## 2021-07-26 NOTE — Assessment & Plan Note (Addendum)
-   Patient maintaining appropriate MAP - Presumed secondary to pneumonia, query aspiration - Patient meets criteria for severe sepsis in setting of DKA, leukocytosis, elevated lactic acid, hypothermia - Patient is maintaining appropriate MAP - Check procalcitonin on admission and for 2 days to assess antibiotic treatment effectiveness - Blood cultures x2 ordered, check MRSA PCR, check a UA - Azithromycin 500 mg IV, 5 days ordered, Unasyn per pharmacy ordered - Aspiration precaution

## 2021-07-26 NOTE — ED Notes (Signed)
Bear hugger and warm blankets applied. Pt connected to monitor. Unable to ask some of triage questions due to AMS and confusion.

## 2021-07-26 NOTE — Consult Note (Signed)
PHARMACY -  BRIEF ANTIBIOTIC NOTE   Pharmacy has received consult(s) for vancomycin and cefepime from an ED provider.  The patient's profile has been reviewed for ht/wt/allergies/indication/available labs.    One time order(s) placed for vancomycin 1 g and cefepime 2 g   Further antibiotics/pharmacy consults should be ordered by admitting physician if indicated.                       Thank you, Darnelle Bos, PharmD 07/26/2021  2:30 PM

## 2021-07-26 NOTE — ED Provider Notes (Signed)
[facility] Provider Note    Event Date/Time   First MD Initiated Contact with Patient 07/26/21 1349     (approximate)   History   Chief Complaint Altered Mental Status  HPI  Brittany [name] is a 61 y.o. female with past medical history of hypertension, diabetes, stroke with right hemiparesis, COPD, seizures, diverticulitis, and dementia who presents to the ED for altered mental status.  History is limited due to patient's baseline dementia and apparent altered mental status.  EMS states that they were called to patient's home due to generalized pain as well as increased confusion.  Patient currently complains of pain all over and is unable to localize the pain.  She denies any fevers or cough, does endorse nausea with vomiting.  EMS reports that they got glucose reading of "high" and patient is unsure what medications she takes for her diabetes.     Physical Exam   Triage Vital Signs: ED Triage Vitals  Enc Vitals Group     BP 07/26/21 1339 99/76     Pulse Rate 07/26/21 1339 75     Resp 07/26/21 1339 (!) 22     Temp 07/26/21 1350 (!) 92.2 F (33.4 C)     Temp Source 07/26/21 1339 Rectal     SpO2 07/26/21 1339 100 %     Weight 07/26/21 1344 105 lb (47.6 kg)     Height 07/26/21 1344 '5\' 3"'$  (1.6 m)     Head Circumference --      Peak Flow --      Pain Score --      Pain Loc --      Pain Edu? --      Excl. in [place]? --     Most recent vital signs: Vitals:   07/26/21 1515 07/26/21 1530  BP: 93/68 99/79  Pulse: 84 83  Resp: 18 19  Temp:    SpO2: 100% 100%    Constitutional: Alert and oriented to person and place, but not time or situation. Eyes: Conjunctivae are normal. Head: Atraumatic. Nose: No congestion/rhinnorhea. Mouth/Throat: Mucous membranes are dry. Cardiovascular: Normal rate, regular rhythm. Grossly normal heart sounds.  2+ radial pulses bilaterally. Respiratory: Normal respiratory effort.  No retractions. Lungs  CTAB. Gastrointestinal: Soft and nontender. No distention.  Colostomy bag in place. Musculoskeletal: No lower extremity tenderness nor edema.  Neurologic:  Normal speech and language.  Right hemiparesis noted.    ED Results / Procedures / Treatments   Labs (all labs ordered are listed, but only abnormal results are displayed) Labs Reviewed  LACTIC ACID, PLASMA - Abnormal; Notable for the following components:      Result Value   Lactic Acid, Venous 2.6 (*)    All other components within normal limits  CBC WITH DIFFERENTIAL/PLATELET - Abnormal; Notable for the following components:   WBC 11.6 (*)    RBC 5.22 (*)    RDW 15.7 (*)    Platelets 440 (*)    Neutro Abs 9.7 (*)    All other components within normal limits  CBG MONITORING, ED - Abnormal; Notable for the following components:   Glucose-Capillary >600 (*)    All other components within normal limits  CULTURE, BLOOD (ROUTINE X 2)  CULTURE, BLOOD (ROUTINE X 2)  PROTIME-INR  LACTIC ACID, PLASMA  URINALYSIS, ROUTINE W REFLEX MICROSCOPIC  BETA-HYDROXYBUTYRIC ACID  BLOOD GAS, VENOUS  COMPREHENSIVE METABOLIC PANEL  MAGNESIUM  TROPONIN I (HIGH SENSITIVITY)  TROPONIN I (HIGH SENSITIVITY)  EKG  ED ECG REPORT I, Blake Divine, the attending physician, personally viewed and interpreted this ECG.   Date: 07/26/2021  EKG Time: 13:54  Rate: 77  Rhythm: normal sinus rhythm  Axis: Normal  Intervals:none  ST&T Change: Borderline diffuse ST depressions  RADIOLOGY Chest x-ray reviewed and interpreted by me with bibasilar infiltrates noted, no edema or effusion noted.  PROCEDURES:  Critical Care performed: Yes  .Critical Care  Performed by: Blake Divine, MD Authorized by: Blake Divine, MD   Critical care provider statement:    Critical care time (minutes):  30   Critical care time was exclusive of:  Separately billable procedures and treating other patients and teaching time   Critical care was necessary  to treat or prevent imminent or life-threatening deterioration of the following conditions:  Sepsis   Critical care was time spent personally by me on the following activities:  Development of treatment plan with patient or surrogate, discussions with consultants, evaluation of patient's response to treatment, examination of patient, ordering and review of laboratory studies, ordering and review of radiographic studies, ordering and performing treatments and interventions, pulse oximetry, re-evaluation of patient's condition and review of old charts   I assumed direction of critical care for this patient from another provider in my specialty: no     Care discussed with: admitting provider      MEDICATIONS ORDERED IN ED: Medications  metroNIDAZOLE (FLAGYL) IVPB 500 mg (500 mg Intravenous New Bag/Given 07/26/21 1506)  vancomycin (VANCOCIN) IVPB 1000 mg/200 mL premix (1,000 mg Intravenous New Bag/Given 07/26/21 1505)  lactated ringers bolus 1,000 mL (1,000 mLs Intravenous New Bag/Given 07/26/21 1505)  ceFEPIme (MAXIPIME) 2 g in sodium chloride 0.9 % 100 mL IVPB (0 g Intravenous Stopped 07/26/21 1511)     IMPRESSION / MDM / [place] / ED COURSE  I reviewed the triage vital signs and the nursing notes.                              61 y.o. female with past medical history of hypertension, diabetes, stroke with right-sided deficits, COPD, diverticulitis status post colostomy, seizures, and dementia who presents to the ED for diffuse pain, increased weakness, and altered mental status per EMS.  Patient's presentation is most consistent with acute presentation with potential threat to life or bodily function.  Differential diagnosis includes, but is not limited to, intracranial process, stroke, sepsis, UTI, pneumonia, dehydration, electrolyte abnormality, DKA, HHS.  Patient ill-appearing but in no acute distress, vitals remarkable for hypothermia upon check of rectal temperature, otherwise  reassuring with stable blood pressure.  Chest x-ray is concerning for bibasilar infiltrates, potentially related to aspiration.  With her hypothermia and lactic acidosis, presentation concerning for sepsis and we will start on broad-spectrum antibiotics.  CBC without significant anemia or leukocytosis, CMP pending at this time to assess for DKA versus HHS.  Patient being hydrated with IV fluids for DKA versus HHS and sepsis.  Patient turned over to oncoming provider pending further results and admission.      FINAL CLINICAL IMPRESSION(S) / ED DIAGNOSES   Final diagnoses:  Altered mental status, unspecified altered mental status type  Sepsis, due to unspecified organism, unspecified whether acute organ dysfunction present ([place])  Hyperglycemia     Rx / DC Orders   ED Discharge Orders     None        Note:  This document was prepared using Dragon voice recognition  software and may include unintentional dictation errors.   Blake Divine, MD 07/26/21 [phone]

## 2021-07-26 NOTE — Consult Note (Signed)
CODE SEPSIS - PHARMACY COMMUNICATION  **Broad Spectrum Antibiotics should be administered within 1 hour of Sepsis diagnosis**  Time Code Sepsis Called/Page Received: 1420  Antibiotics Ordered: 1420  Time of 1st antibiotic administration: 1500  Additional action taken by pharmacy: n/a  If necessary, Name of Provider/Nurse Contacted: n/a    Darnelle Bos ,PharmD Clinical Pharmacist  07/26/2021  2:29 PM

## 2021-07-26 NOTE — ED Notes (Signed)
RN aware of bed assigned 

## 2021-07-26 NOTE — Assessment & Plan Note (Addendum)
-   Admit to inpatient, stepdown - Presumed secondary to diabetic diet noncompliance and pneumonia - Continue treatment with DKA protocol including insulin GTT, aggressive fluid management - N.p.o. - Repeat BMP every 4 hours and beta hydroxybutyrate every 8 hours

## 2021-07-26 NOTE — Assessment & Plan Note (Signed)
-   Atorvastatin 40 mg nightly ?

## 2021-07-26 NOTE — Assessment & Plan Note (Signed)
-   Presumed secondary to DKA - Treat as above

## 2021-07-27 DIAGNOSIS — J189 Pneumonia, unspecified organism: Secondary | ICD-10-CM | POA: Diagnosis not present

## 2021-07-27 DIAGNOSIS — E1169 Type 2 diabetes mellitus with other specified complication: Secondary | ICD-10-CM | POA: Diagnosis not present

## 2021-07-27 DIAGNOSIS — E785 Hyperlipidemia, unspecified: Secondary | ICD-10-CM

## 2021-07-27 DIAGNOSIS — E1110 Type 2 diabetes mellitus with ketoacidosis without coma: Secondary | ICD-10-CM | POA: Diagnosis not present

## 2021-07-27 LAB — BETA-HYDROXYBUTYRIC ACID
Beta-Hydroxybutyric Acid: 0.89 mmol/L — ABNORMAL HIGH (ref 0.05–0.27)
Beta-Hydroxybutyric Acid: 0.29 mmol/L — ABNORMAL HIGH (ref 0.05–0.27)

## 2021-07-27 LAB — BASIC METABOLIC PANEL
Anion gap: 8 (ref 5–15)
Anion gap: 6 (ref 5–15)
Anion gap: 6 (ref 5–15)
BUN: 46 mg/dL — ABNORMAL HIGH (ref 8–23)
BUN: 38 mg/dL — ABNORMAL HIGH (ref 8–23)
BUN: 33 mg/dL — ABNORMAL HIGH (ref 8–23)
CO2: 25 mmol/L (ref 22–32)
CO2: 27 mmol/L (ref 22–32)
CO2: 27 mmol/L (ref 22–32)
Calcium: 8.8 mg/dL — ABNORMAL LOW (ref 8.9–10.3)
Calcium: 8.7 mg/dL — ABNORMAL LOW (ref 8.9–10.3)
Calcium: 8.6 mg/dL — ABNORMAL LOW (ref 8.9–10.3)
Chloride: 96 mmol/L — ABNORMAL LOW (ref 98–111)
Chloride: 100 mmol/L (ref 98–111)
Chloride: 100 mmol/L (ref 98–111)
Creatinine, Ser: 0.75 mg/dL (ref 0.44–1.00)
Creatinine, Ser: 0.71 mg/dL (ref 0.44–1.00)
Creatinine, Ser: 0.48 mg/dL (ref 0.44–1.00)
GFR, Estimated: >60 mL/min (ref >60)
GFR, Estimated: >60 mL/min (ref >60)
GFR, Estimated: >60 mL/min (ref >60)
Glucose, Bld: 266 mg/dL — ABNORMAL HIGH (ref 70–99)
Glucose, Bld: 163 mg/dL — ABNORMAL HIGH (ref 70–99)
Glucose, Bld: 137 mg/dL — ABNORMAL HIGH (ref 70–99)
Potassium: 3.7 mmol/L (ref 3.5–5.1)
Potassium: 3.6 mmol/L (ref 3.5–5.1)
Potassium: 3.7 mmol/L (ref 3.5–5.1)
Sodium: 129 mmol/L — ABNORMAL LOW (ref 135–145)
Sodium: 133 mmol/L — ABNORMAL LOW (ref 135–145)
Sodium: 133 mmol/L — ABNORMAL LOW (ref 135–145)

## 2021-07-27 LAB — GLUCOSE, CAPILLARY
Glucose-Capillary: 128 mg/dL — ABNORMAL HIGH (ref 70–99)
Glucose-Capillary: 133 mg/dL — ABNORMAL HIGH (ref 70–99)
Glucose-Capillary: 134 mg/dL — ABNORMAL HIGH (ref 70–99)
Glucose-Capillary: 144 mg/dL — ABNORMAL HIGH (ref 70–99)
Glucose-Capillary: 159 mg/dL — ABNORMAL HIGH (ref 70–99)
Glucose-Capillary: 170 mg/dL — ABNORMAL HIGH (ref 70–99)
Glucose-Capillary: 191 mg/dL — ABNORMAL HIGH (ref 70–99)
Glucose-Capillary: 203 mg/dL — ABNORMAL HIGH (ref 70–99)
Glucose-Capillary: 224 mg/dL — ABNORMAL HIGH (ref 70–99)
Glucose-Capillary: 225 mg/dL — ABNORMAL HIGH (ref 70–99)
Glucose-Capillary: 232 mg/dL — ABNORMAL HIGH (ref 70–99)
Glucose-Capillary: 233 mg/dL — ABNORMAL HIGH (ref 70–99)
Glucose-Capillary: 263 mg/dL — ABNORMAL HIGH (ref 70–99)
Glucose-Capillary: 268 mg/dL — ABNORMAL HIGH (ref 70–99)

## 2021-07-27 LAB — PROCALCITONIN: Procalcitonin: 0.26 ng/mL

## 2021-07-27 MED ORDER — DIPHENHYDRAMINE HCL 50 MG/ML IJ SOLN
25.0000 mg | Freq: Four times a day (QID) | INTRAMUSCULAR | Status: DC | PRN
  Administered 2021-07-27 – 2021-07-28 (×2): 25 mg via INTRAVENOUS
  Filled 2021-07-27 (×2): qty 1

## 2021-07-27 MED ORDER — ADULT MULTIVITAMIN W/MINERALS CH
1.0000 | ORAL_TABLET | Freq: Every day | ORAL | Status: DC
  Administered 2021-07-28: 1 via ORAL
  Filled 2021-07-27: qty 1

## 2021-07-27 MED ORDER — INSULIN ASPART 100 UNIT/ML IJ SOLN
3.0000 [IU] | Freq: Three times a day (TID) | INTRAMUSCULAR | Status: DC
  Administered 2021-07-27 – 2021-07-28 (×4): 3 [IU] via SUBCUTANEOUS
  Filled 2021-07-27 (×4): qty 1

## 2021-07-27 MED ORDER — INSULIN ASPART 100 UNIT/ML IJ SOLN
0.0000 [IU] | Freq: Three times a day (TID) | INTRAMUSCULAR | Status: DC
  Administered 2021-07-27: 2 [IU] via SUBCUTANEOUS
  Administered 2021-07-27: 1 [IU] via SUBCUTANEOUS
  Administered 2021-07-28 (×2): 5 [IU] via SUBCUTANEOUS
  Filled 2021-07-27 (×3): qty 1

## 2021-07-27 MED ORDER — LORAZEPAM 2 MG/ML IJ SOLN
1.0000 mg | Freq: Once | INTRAMUSCULAR | Status: DC
Start: 2021-07-27 — End: 2021-07-28

## 2021-07-27 MED ORDER — INSULIN GLARGINE-YFGN 100 UNIT/ML ~~LOC~~ SOLN
20.0000 [IU] | Freq: Every day | SUBCUTANEOUS | Status: DC
  Administered 2021-07-27 – 2021-07-28 (×2): 20 [IU] via SUBCUTANEOUS
  Filled 2021-07-27 (×2): qty 0.2

## 2021-07-27 MED ORDER — SODIUM CHLORIDE 0.9 % IV SOLN: 25.0000 mg | Freq: Four times a day (QID) | INTRAVENOUS | Status: DC | PRN

## 2021-07-27 MED ORDER — GLUCERNA SHAKE PO LIQD
237.0000 mL | Freq: Three times a day (TID) | ORAL | Status: DC
  Administered 2021-07-27 – 2021-07-28 (×3): 237 mL via ORAL

## 2021-07-27 NOTE — Progress Notes (Signed)
Report called to 2c RN. Sitter at bedside for itching LUE and IV. Pt restless and attempts to get out of bed. Oriented to self only. Denies pain but complains of itching. No rash or swelling noted but pt is creating scratches and bleeding to LUE. Benadryl administered IV with little effect. Distraction is helpful. Daughter updated on pt. Tolerating PO. Will transfer via floor bed.

## 2021-07-27 NOTE — Progress Notes (Addendum)
PROGRESS NOTE    Brittany [name]  IDH:[id number] DOB: 1960-07-31 DOA: 07/26/2021 PCP: Pcp, No    Assessment & Plan:   Principal Problem:   DKA (diabetic ketoacidosis) ([place]) Active Problems:   Major depressive disorder, recurrent episode, moderate (HCC)   Type 2 diabetes mellitus with hyperlipidemia (HCC)   Seizure disorder (HCC)   Restless leg syndrome   History of stroke   Status post colostomy (HCC)   Protein-calorie malnutrition, severe   Tobacco dependence   Malnutrition of moderate degree   Hypothermia   HLD (hyperlipidemia)   Tobacco abuse   High anion gap metabolic acidosis   Severe sepsis ([place])   CAP (community acquired pneumonia)  Assessment and Plan: DKA: will d/c IV insulin drip and start sq insulin as anion gap is closed.   DM2: poorly controlled. D/c IV insulin drip and starting glargine, SSI w/ accuchecks    Hx of CVA: continue on aspirin    S/p colostomy: continue w/ colostomy bag care   CAP: possible aspiration. Continue on azithromycin, unasyn    Severe sepsis: met criteria w/ leukocytosis, elevated lactic acid, hypothermia, & DKA. Continue on IV azithromycin, unasyn. Procal 0.26    High anion gap metabolic acidosis: secondary to DKA. Resolved    Tobacco abuse: smoking cessation counseling. Nicotine patch to prevent w/drawal    HLD: continue on statin    Hypothermia: resolved   Severe malnutrition: as evidenced by severe fat & muscle depletion as per RD. Continue on nutritional supplements      DVT prophylaxis:  lovenox  Code Status: full  Family Communication:  Disposition Plan: likely d/c back home   Level of care: Med-Surg  Status is: Inpatient Remains inpatient appropriate because: severity of illness, weaned off of insulin drip today    Consultants:    Procedures:   Antimicrobials:    Subjective: Pt c/o fatigue   Objective: Vitals:   07/27/21 1100 07/27/21 1200 07/27/21 1300 07/27/21 1400  BP: 117/85 117/72 119/83  131/83  Pulse: 90 98 98 94  Resp: 17 (!) 24 (!) 22   Temp:  98 F (36.7 C)    TempSrc:  Axillary    SpO2: 99% 100% 100% 100%  Weight:      Height:        Intake/Output Summary (Last 24 hours) at 07/27/2021 1505 Last data filed at 07/27/2021 1000 Gross per 24 hour  Intake 6191.86 ml  Output 1340 ml  Net 4851.86 ml   Filed Weights   07/26/21 1344 07/26/21 1845  Weight: 47.6 kg 41.6 kg    Examination:  General exam: Appears calm and comfortable  Respiratory system: Clear to auscultation. Respiratory effort normal. Cardiovascular system: S1 & S2 +. No JVD gallops or clicks. Gastrointestinal system: Abdomen is nondistended, soft and nontender.Normal bowel sounds heard. Central nervous system: Alert and oriented. Moves all extremities  Psychiatry: Judgement and insight appear normal. Flat mood and affect      Data Reviewed: I have personally reviewed following labs and imaging studies  CBC: Recent Labs  Lab 07/26/21 1453  WBC 11.6*  NEUTROABS 9.7*  HGB 14.3  HCT 46.0  MCV 88.1  PLT 902*   Basic Metabolic Panel: Recent Labs  Lab 07/26/21 1557 07/26/21 2132 07/27/21 0138 07/27/21 0529 07/27/21 0822  NA 117* 127* 129* 133* 133*  K 5.3* 3.8 3.7 3.6 3.7  CL 84* 94* 96* 100 100  CO2 14* 19* _0 GLUCOSE 736* 441* 266* 163* 137*  BUN 70*  52* 46* 38* 33*  CREATININE 1.33* 1.04* 0.75 0.71 0.48  CALCIUM 9.3 8.4* 8.8* 8.7* 8.6*  MG 1.9  --   --   --   --    GFR: Estimated Creatinine Clearance: 48.5 mL/min (by C-G formula based on SCr of 0.48 mg/dL). Liver Function Tests: Recent Labs  Lab 07/26/21 1557  AST 22  ALT 38  ALKPHOS 126  BILITOT 1.5*  PROT 9.0*  ALBUMIN 3.3*   No results for input(s): "LIPASE", "AMYLASE" in the last 168 hours. No results for input(s): "AMMONIA" in the last 168 hours. Coagulation Profile: Recent Labs  Lab 07/26/21 1453  INR 1.0   Cardiac Enzymes: No results for input(s): "CKTOTAL", "CKMB", "CKMBINDEX", "TROPONINI" in  the last 168 hours. BNP (last 3 results) No results for input(s): "PROBNP" in the last 8760 hours. HbA1C: No results for input(s): "HGBA1C" in the last 72 hours. CBG: Recent Labs  Lab 07/27/21 0722 07/27/21 0820 07/27/21 0922 07/27/21 1026 07/27/21 1122  GLUCAP 128* 133* 134* 170* 144*   Lipid Profile: No results for input(s): "CHOL", "HDL", "LDLCALC", "TRIG", "CHOLHDL", "LDLDIRECT" in the last 72 hours. Thyroid Function Tests: Recent Labs    07/26/21 2132  TSH 2.191   Anemia Panel: No results for input(s): "VITAMINB12", "FOLATE", "FERRITIN", "TIBC", "IRON", "RETICCTPCT" in the last 72 hours. Sepsis Labs: Recent Labs  Lab 07/26/21 1453 07/26/21 1557 07/26/21 2132 07/27/21 0529  PROCALCITON  --   --  0.25 0.26  LATICACIDVEN 2.6* 2.5*  --   --     Recent Results (from the past 240 hour(s))  Culture, blood (Routine x 2)     Status: None (Preliminary result)   Collection Time: 07/26/21  2:00 PM   Specimen: BLOOD  Result Value Ref Range Status   Specimen Description BLOOD BLOOD RIGHT ARM  Final   Special Requests   Final    BOTTLES DRAWN AEROBIC AND ANAEROBIC Blood Culture adequate volume   Culture   Final    NO GROWTH < 24 HOURS Performed at [facility], [address] [place], [place] [address]    Report Status PENDING  Incomplete  MRSA Next Gen by PCR, Nasal     Status: None   Collection Time: 07/26/21  6:53 PM   Specimen: Nasal Mucosa; Nasal Swab  Result Value Ref Range Status   MRSA by PCR Next Gen NOT DETECTED NOT DETECTED Final    Comment: (NOTE) The GeneXpert MRSA Assay (FDA approved for NASAL specimens only), is one component of a comprehensive MRSA colonization surveillance program. It is not intended to diagnose MRSA infection nor to guide or monitor treatment for MRSA infections. Test performance is not FDA approved in patients less than 50 years old. Performed at [facility], [address] [place], [place] [address]    Culture, blood (Routine x 2)     Status: None (Preliminary result)   Collection Time: 07/26/21  9:32 PM   Specimen: BLOOD  Result Value Ref Range Status   Specimen Description BLOOD RIGHT ARM  Final   Special Requests   Final    BOTTLES DRAWN AEROBIC AND ANAEROBIC Blood Culture adequate volume   Culture   Final    NO GROWTH < 12 HOURS Performed at [facility], [address], [place], [place] [address]    Report Status PENDING  Incomplete         Radiology Studies: CT ABDOMEN PELVIS WO CONTRAST  Result Date: 07/26/2021 CLINICAL DATA:  Abdominal pain EXAM:  CT ABDOMEN AND PELVIS WITHOUT CONTRAST TECHNIQUE: Multidetector CT imaging of the abdomen and pelvis was performed following the standard protocol without IV contrast. RADIATION DOSE REDUCTION: This exam was performed according to the departmental dose-optimization program which includes automated exposure control, adjustment of the mA and/or kV according to patient size and/or use of iterative reconstruction technique. COMPARISON:  CT chest abdomen pelvis, 04/28/2021 FINDINGS: Lower chest: Extensive heterogeneous ground-glass and consolidative airspace opacity throughout the lung bases (series 5, image 12). Coronary artery calcifications. Hepatobiliary: No focal liver abnormality is seen. Status post cholecystectomy. No biliary dilatation. 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 Status post distal pancreatectomy. Spleen: Status post splenectomy. Adrenals/Urinary Tract: Adrenal glands are unremarkable. Kidneys are normal, without renal calculi, solid lesion, or hydronephrosis. Bladder is unremarkable. Stomach/Bowel: Stomach is within normal limits. Appendix appears normal. No evidence of bowel wall thickening, distention, or inflammatory changes. Left lower quadrant end  colostomy with sigmoid colon resection and rectal stump. Vascular/Lymphatic: Aortic atherosclerosis. No enlarged abdominal or pelvic lymph nodes. Reproductive: No mass or other significant abnormality. Other: No abdominal wall hernia or abnormality. No ascites. Musculoskeletal: No acute or significant osseous findings. IMPRESSION: 1. Extensive heterogeneous ground-glass and consolidative airspace opacity throughout the lung bases, consistent with infection or aspiration. 2. 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 3. Status post splenectomy and distal pancreatectomy. 4. Status post sigmoid colon resection with left lower quadrant end colostomy. 5. Coronary artery disease. Aortic Atherosclerosis (ICD10-I70.0). Electronically Signed   By: Delanna Ahmadi M.D.   On: 07/26/2021 17:20   CT Head Wo Contrast  Result Date: 07/26/2021 CLINICAL DATA:  Altered mental status EXAM: CT HEAD WITHOUT CONTRAST TECHNIQUE: Contiguous axial images were obtained from the base of the skull through the vertex without intravenous contrast. RADIATION DOSE REDUCTION: This exam was performed according to the departmental dose-optimization program which includes automated exposure control, adjustment of the mA and/or kV according to patient size and/or use of iterative reconstruction technique. COMPARISON:  Head CT 06/25/2021 FINDINGS: Brain: Large remote infarction in the LEFT cerebellum and occipital lobe. No evidence of acute infarction. Extensive subcortical white matter hypodensities. No midline shift or mass effect. No intracranial hemorrhage. Basal cisterns are patent. There are periventricular and subcortical white matter hypodensities. Generalized cortical atrophy. Vascular: No hyperdense vessel or unexpected calcification. Skull: Normal. Negative for fracture or focal lesion. Sinuses/Orbits: Paranasal sinuses and mastoid  air cells are clear. Orbits are clear. Other: None. IMPRESSION: 1. No acute intracranial findings. 2. Large LEFT PCA territory infarction unchanged from 06/25/2021 Electronically Signed   By: Suzy Bouchard M.D.   On: 07/26/2021 17:18   DG Chest Port 1 View  Result Date: 07/26/2021 CLINICAL DATA:  61 year old female presenting for evaluation of chest and abdomen pain. EXAM: PORTABLE CHEST 1 VIEW COMPARISON:  Jun 25, 2021. FINDINGS: EKG leads project over the chest. Trachea is midline. Cardiomediastinal contours and hilar structures are normal. Bibasilar airspace disease similar in the RIGHT lower lobe and increased on the LEFT with nodular appearance more so on the RIGHT than the LEFT. No pneumothorax. No gross evidence of pleural effusion. On limited assessment there is no acute skeletal process. IMPRESSION: Findings suspicious for basilar pneumonia now seen bilaterally. Correlate with any risk factors for or history of aspiration based on  distribution. More nodular appearance in the RIGHT lower chest persists. Suggest follow-up to ensure clearing and exclude persistent lung nodule. Electronically Signed   By: Zetta Bills M.D.   On: 07/26/2021 14:34        Scheduled Meds:  aspirin EC  81 mg Oral Daily   atorvastatin  40 mg Oral QHS   Chlorhexidine Gluconate Cloth  6 each Topical Q0600   enoxaparin (LOVENOX) injection  40 mg Subcutaneous Q24H   feeding supplement (GLUCERNA SHAKE)  237 mL Oral TID BM   insulin aspart  0-9 Units Subcutaneous TID WC   insulin aspart  3 Units Subcutaneous TID WC   insulin glargine-yfgn  20 Units Subcutaneous Daily   mirtazapine  15 mg Oral QHS   [START ON 07/28/2021] multivitamin with minerals  1 tablet Oral Daily   OLANZapine  7.5 mg Oral QHS   rOPINIRole  0.5 mg Oral QHS   Continuous Infusions:  ampicillin-sulbactam (UNASYN) IV 3 g (07/27/21 1438)   azithromycin Stopped (07/26/21 2148)   dextrose 5% lactated ringers 100 mL/hr at 07/27/21 1031     LOS:  1 day    Time spent: 33 mins     Wyvonnia Dusky, MD [facility] Pager [phone]  If 7PM-7AM, please contact night-coverage 07/27/2021, 3:05 PM

## 2021-07-27 NOTE — Progress Notes (Signed)
Inpatient Diabetes Program Recommendations  AACE/ADA: New Consensus Statement on Inpatient Glycemic Control (2015)  Target Ranges:  Prepandial:   less than 140 mg/dL      Peak postprandial:   less than 180 mg/dL (1-2 hours)      Critically ill patients:  140 - 180 mg/dL    Latest Reference Range & Units 07/26/21 15:57  Glucose 70 - 99 mg/dL 736 (HH)    Latest Reference Range & Units 07/26/21 14:53  Beta-Hydroxybutyric Acid 0.05 - 0.27 mmol/L 6.42 (H)    Latest Reference Range & Units 07/26/21 22:45 07/26/21 23:33 07/27/21 00:37 07/27/21 01:28 07/27/21 02:26 07/27/21 03:25 07/27/21 04:23 07/27/21 05:22 07/27/21 06:17  Glucose-Capillary 70 - 99 mg/dL 320 (H)  IV Insulin Drip Infusing 307 (H) 232 (H) 268 (H) 263 (H) 225 (H) 224 (H) 233 (H) 159 (H)    Admit with: DKA, Pneumonia, Sepsis  History: Diabetes, CVA, Pancreactomy/splenectomy  Home DM Meds: Humalog 5 units TID with meals       Lantus 10 units QHS  Current Orders: IV Insulin Drip     Patient is well known to the Inpatient Diabetes Team This is the 4th admission to [facility] since January 2023 Per MD notes, daughter administers patient's insulin however patient eats what ever she wants and is not compliant with a diabetic diet.     MD- Note 5:29am BMET shows the following: Glucose 163/ Anion Gap 6/ CO2 level 27  When you allow pt to transition to SQ Insulin, please consider: 1. Semglee 20 units Daily (please make sure to continue IV Insulin Drip for 2 hours after Semglee on board) Home Med list states Lantus 10 units QHS, however, last discharge summary from Dr. Billie Ruddy showed pt is supposed to be taking Lantus 40 units QHS  2. Start Novolog Sensitive Correction Scale/ SSI (0-9 units) TID AC + HS  3. Start Novolog 3 units TID with meals for Meal Coverage (if allowed to eat)     --Will follow patient during hospitalization--  Wyn Quaker RN, MSN, CDE Diabetes Coordinator Inpatient Glycemic Control  Team Team Pager: [phone] (8a-5p)
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
## 2021-07-27 NOTE — Consult Note (Addendum)
[place] Nurse ostomy consult note Pt is familiar to [facility] team from previous admissions.  She had colostomy surgery performed 03/10/21 and states her daughter performs pouch changes and emptying prior to admission and denies any problems. Stoma type/location: Stoma is red and viable, 1 1/2 inches, above skin level. Peristomal assessment: intact skin surrounding Output: 40cc brown liquid stool Ostomy pouching: 2pc.  Applied barrier ring and 2 piece pouching system. Another set of supplies left at the bedside for staff nurses. Use Supplies: barrier ring, Lawson # [id number], wafer Kellie Simmering # [id number], pouch Lawson # [id number] Please re-consult if further assistance is needed.  Thank-you,  Julien Girt MSN, [place], [place], [place], [place]

## 2021-07-27 NOTE — Progress Notes (Addendum)
Initial Nutrition Assessment  DOCUMENTATION CODES:   Severe malnutrition in context of chronic illness  INTERVENTION:   Glucerna Shake po TID, each supplement provides 220 kcal and 10 grams of protein  MVI po daily   Pt at high refeed risk; recommend monitor potassium, magnesium and phosphorus labs daily until stable  NUTRITION DIAGNOSIS:   Severe Malnutrition related to chronic illness (COPD, pancreatic cancer) as evidenced by severe fat depletion, severe muscle depletion.  GOAL:   Patient will meet greater than or equal to 90% of their needs  MONITOR:   PO intake, Supplement acceptance, Labs, Weight trends, Skin, I & O's  REASON FOR ASSESSMENT:   Consult Assessment of nutrition requirement/status  ASSESSMENT:   61 y.o. 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  Visited pt's room today. Pt is well known to this RD from previous admissions. Pt is unable to provide any nutrition related history today. Pt opens her eyes briefly during her exam. Pt with poor appetite and oral intake at baseline. Pt does not drink "milky" supplements as she is lactose intolerance. Per chart, pt appears weight stable pta. Pt initiated on diet today. RD will add supplements and MVI to help pt meet her estimated needs. Pt is at high refeed risk.   Medications reviewed and include: aspirin, lovenox, insulin, remeron, unasyn, azithromycin, LRS w/ 5% dextrose '@100ml'$ /hr  Labs reviewed: 133(L), K 3.7 wnl, BUN 33(H) Wbc- 11.6(H) Cbgs- 170, 134, 133, 128, 159 x 24  hrs AIC 15.0(H)- 2/23  NUTRITION - FOCUSED PHYSICAL EXAM:  Flowsheet Row Most Recent Value  Orbital Region Moderate depletion  Upper Arm Region Severe depletion  Thoracic and Lumbar Region Severe depletion  Buccal Region Moderate depletion  Temple Region Severe depletion  Clavicle Bone Region Severe depletion  Clavicle and Acromion Bone Region Severe depletion  Scapular Bone Region Severe depletion  Dorsal Hand Severe depletion  Patellar Region Severe depletion  Anterior Thigh Region Severe depletion  Posterior Calf Region Severe depletion  Edema (RD Assessment) None  Hair Reviewed  Eyes Reviewed  Mouth Reviewed  Skin Reviewed  Nails Reviewed   Diet Order:   Diet Order             Diet Carb Modified Fluid consistency: Thin; Room service appropriate? Yes  Diet effective now                  EDUCATION NEEDS:   No education needs have been identified at this time  Skin:  Skin Assessment: Reviewed RN Assessment  Last BM:  6/15- 62m via ostomy  Height:   Ht Readings from Last 1 Encounters:  07/26/21 '5\' 3"'$  (1.6 m)    Weight:   Wt Readings from Last 1 Encounters:  07/26/21 41.6 kg    Ideal Body Weight:  52.3 kg  BMI:  Body mass index is 16.25 kg/m.  Estimated Nutritional Needs:   Kcal:  1400-1600kcal/day  Protein:  70-80g/day  Fluid:  1.3-1.5L/day  CKoleen DistanceMS, RD, LDN Please refer to [facility] Hospitalfor RD and/or RD on-call/weekend/after hours pager

## 2021-07-28 DIAGNOSIS — E785 Hyperlipidemia, unspecified: Secondary | ICD-10-CM | POA: Diagnosis not present

## 2021-07-28 DIAGNOSIS — J189 Pneumonia, unspecified organism: Secondary | ICD-10-CM | POA: Diagnosis not present

## 2021-07-28 DIAGNOSIS — E1169 Type 2 diabetes mellitus with other specified complication: Secondary | ICD-10-CM | POA: Diagnosis not present

## 2021-07-28 DIAGNOSIS — Z720 Tobacco use: Secondary | ICD-10-CM | POA: Diagnosis not present

## 2021-07-28 LAB — MAGNESIUM: Magnesium: 1.7 mg/dL (ref 1.7–2.4)

## 2021-07-28 LAB — BASIC METABOLIC PANEL
Anion gap: 6 (ref 5–15)
BUN: 14 mg/dL (ref 8–23)
CO2: 30 mmol/L (ref 22–32)
Calcium: 8.4 mg/dL — ABNORMAL LOW (ref 8.9–10.3)
Chloride: 99 mmol/L (ref 98–111)
Creatinine, Ser: 0.56 mg/dL (ref 0.44–1.00)
GFR, Estimated: >60 mL/min (ref >60)
Glucose, Bld: 309 mg/dL — ABNORMAL HIGH (ref 70–99)
Potassium: 3.2 mmol/L — ABNORMAL LOW (ref 3.5–5.1)
Sodium: 135 mmol/L (ref 135–145)

## 2021-07-28 LAB — CBC
HCT: 33.6 % — ABNORMAL LOW (ref 36.0–46.0)
Hemoglobin: 11.1 g/dL — ABNORMAL LOW (ref 12.0–15.0)
MCH: 27.1 pg (ref 26.0–34.0)
MCHC: 33 g/dL (ref 30.0–36.0)
MCV: 82.2 fL (ref 80.0–100.0)
Platelets: 379 10*3/uL (ref 150–400)
RBC: 4.09 MIL/uL (ref 3.87–5.11)
RDW: 15.3 % (ref 11.5–15.5)
WBC: 9 10*3/uL (ref 4.0–10.5)
nRBC: 0 % (ref 0.0–0.2)

## 2021-07-28 LAB — GLUCOSE, CAPILLARY
Glucose-Capillary: 278 mg/dL — ABNORMAL HIGH (ref 70–99)
Glucose-Capillary: 275 mg/dL — ABNORMAL HIGH (ref 70–99)

## 2021-07-28 LAB — PHOSPHORUS: Phosphorus: 1.8 mg/dL — ABNORMAL LOW (ref 2.5–4.6)

## 2021-07-28 LAB — PROCALCITONIN: Procalcitonin: 0.11 ng/mL

## 2021-07-28 MED ORDER — POTASSIUM CHLORIDE CRYS ER 20 MEQ PO TBCR
40.0000 meq | EXTENDED_RELEASE_TABLET | Freq: Once | ORAL | Status: AC
Start: 2021-07-28 — End: 2021-07-28
  Administered 2021-07-28: 40 meq via ORAL
  Filled 2021-07-28: qty 2

## 2021-07-28 MED ORDER — AZITHROMYCIN 250 MG PO TABS: 250.0000 mg | ORAL_TABLET | Freq: Every day | ORAL | 0 refills | Status: AC

## 2021-07-28 MED ORDER — K PHOS MONO-SOD PHOS DI & MONO 155-852-130 MG PO TABS
500.0000 mg | ORAL_TABLET | Freq: Once | ORAL | Status: AC
  Administered 2021-07-28: 500 mg via ORAL
  Filled 2021-07-28: qty 2

## 2021-07-28 NOTE — Progress Notes (Signed)
Mobility Specialist - Progress Note   07/28/21 1400  Mobility  Activity Stood at bedside;Dangled on edge of bed  Level of Assistance Minimal assist, patient does 75% or more  Assistive Device None  Distance Ambulated (ft) 0 ft  Activity Response Tolerated well  $Mobility charge 1 Mobility    Pt sitting in bed upon arrival using RA with sitter present. Completes bed mobility Mod I + extra time. STS MinA HHA. Encourages pt to take a few steps forward, but she impulsively sits and returns to bed. Pt left with needs in reach and sitter present.  Merrily Brittle Mobility Specialist 07/28/21, 2:12 PM

## 2021-07-28 NOTE — Discharge Summary (Signed)
Physician Discharge Summary  Brittany [name] WEX:[id number] DOB: 1960/12/23 DOA: 07/26/2021  PCP: Merryl Hacker, No  Admit date: 07/26/2021 Discharge date: 07/28/2021  Admitted From: home  Disposition:  home w/ home health   Recommendations for Outpatient Follow-up:  Follow up with PCP in 1-2 weeks   Home Health: yes Equipment/Devices:  Discharge Condition: stable  CODE STATUS: full Diet recommendation: Heart Healthy / Carb Modified  Brief/Interim Summary: HPI was taken from Dr. Tobie Poet: Brittany [name], is a 61 year old female with history of hypertension, poorly controlled insulin-dependent diabetes mellitus, history of left PCA territory infarct, right hemiparesis, COPD, history of pancreectomy/splenectomy, history of diverticulitis with abscess leading to colostomy in January 2023, anxiety, depression, who presents emergency department for chief concerns of altered mental status, generalized body pains.   Initial vitals in the emergency department showed temperature of 92.2,Improved to 95.7, rectally, respiration rate of 13, heart rate 75, blood pressure 104/82, SPO2 of 100% on room air.  Serum sodium 117, potassium 5.3, chloride 84, bicarb 14, BUN of 70, serum creatinine of 1.33, nonfasting blood glucose 736, anion gap was elevated at 19, GFR 46.   Lactic acid elevated 2.6 and decreased to 2.5.   WBC elevated 11.6, hemoglobin 14.3, platelets of 440.   High since he troponin was 16 and on repeat was 17.   ED treatment: Insulin GTT, cefepime, LR 2 L bolus, vancomycin and metronidazole.   At bedside patient was able to tell me her name.  She was not able to tell me the current calendar year or her age.   She appears calm and does not appear to be in acute distress.   Her daughter states that her mother at baseline complains of pain everywhere.  However today she complained of having her belly pain and her chest pain therefore daughter was concerned and called EMS.   Patient stated to  daughter that she has been vomiting however daughter was not able to find evidence of vomitus in the room.  Her daughter administers patient's insulin however patient eats what ever she wants and is not compliant with a diabetic diet.   Social history: She lives at home with her daughter   ROS: Not able to complete as patient is altered   ED Course: Discussed with emergency medicine provider, patient requiring hospitalization for chief concerns of DKA.   As per Dr. Jimmye Norman 6/15-6/16/23: Pt presented in DKA and was initially put on IV insulin drip. Anion gap closed, pt was weaned off of insulin drip and started on sq insulin. Of note, pt was found to CAP and received IV unasyn, azithromycin while inpatient. There was some concern of possible aspiration pneumonia. Pt was d/c home w/ po azithromycin to complete the course. Pt was already receiving home w/ home health so home health was resumed w/ d/c.   Discharge Diagnoses:  Principal Problem:   DKA (diabetic ketoacidosis) ([place]) Active Problems:   Major depressive disorder, recurrent episode, moderate (HCC)   Type 2 diabetes mellitus with hyperlipidemia (HCC)   Seizure disorder (HCC)   Restless leg syndrome   History of stroke   Status post colostomy (HCC)   Protein-calorie malnutrition, severe   Tobacco dependence   Malnutrition of moderate degree   Hypothermia   HLD (hyperlipidemia)   Tobacco abuse   High anion gap metabolic acidosis   Severe sepsis ([place])   CAP (community acquired pneumonia)  DKA: will d/c IV insulin drip and start sq insulin as anion gap is closed.   DM2:  poorly controlled. D/c IV insulin drip. Continue on glargine, SSI w/ accuchecks    Hx of CVA: continue on aspirin    S/p colostomy: continue w/ colostomy bag care   CAP: possible aspiration. Continue on azithromycin, unasyn    Severe sepsis: met criteria w/ leukocytosis, elevated lactic acid, hypothermia, & DKA. Continue on IV azithromycin, unasyn. Procal  0.26    High anion gap metabolic acidosis: secondary to DKA. Resolved    Tobacco abuse: smoking cessation counseling. Nicotine patch to prevent w/drawal    HLD: continue on statin    Hypothermia: resolved   Severe malnutrition: as evidenced by severe fat & muscle depletion as per RD. Continue on nutritional supplements  Discharge Instructions  Discharge Instructions     Diet Carb Modified   Complete by: As directed    Discharge instructions   Complete by: As directed    F/u w/ PCP in 1-2 weeks   Increase activity slowly   Complete by: As directed       Allergies as of 07/28/2021       Reactions   Cephalosporins Itching   TOLERATED ZOSYN (PIPERACILLIN) BEFORE   Hydromorphone Itching   Keflin [cephalothin] Itching   Lactose Intolerance (gi) Diarrhea        Medication List     STOP taking these medications    multivitamin with minerals Tabs tablet       TAKE these medications    albuterol 108 (90 Base) MCG/ACT inhaler Commonly known as: VENTOLIN HFA Inhale 2 puffs into the lungs every 6 (six) hours as needed for wheezing or shortness of breath.   aspirin EC 81 MG tablet Take 1 tablet (81 mg total) by mouth daily. Swallow whole.   atorvastatin 40 MG tablet Commonly known as: LIPITOR Take 1 tablet (40 mg total) by mouth Nightly. What changed: when to take this   azithromycin 250 MG tablet Commonly known as: Zithromax Take 1 tablet (250 mg total) by mouth daily for 2 days.   camphor-menthol lotion Commonly known as: SARNA Apply topically as needed for itching. What changed: how much to take   diclofenac Sodium 1 % Gel Commonly known as: VOLTAREN Apply 2 g topically 4 (four) times daily as needed.   insulin lispro 100 UNIT/ML KwikPen Commonly known as: HumaLOG KwikPen Inject 5 Units into the skin 3 (three) times daily with meals.   Insulin Pen Needle 32G X 4 MM Misc 1 Units by Does not apply route 3 (three) times daily.   Lantus SoloStar 100  UNIT/ML Solostar Pen Generic drug: insulin glargine 10 Units at bedtime.   melatonin 3 MG Tabs tablet Take 2 tablets (6 mg total) by mouth at bedtime as needed. Home med.   mirtazapine 15 MG tablet Commonly known as: REMERON Take 15 mg by mouth at bedtime.   OLANZapine 7.5 MG tablet Commonly known as: ZYPREXA Take 1 tablet (7.5 mg total) by mouth at bedtime.   Pancrelipase (Lip-Prot-Amyl) 24000-76000 units Cpep Take 3 capsules (72,000 Units total) by mouth 3 (three) times daily.   polyethylene glycol 17 g packet Commonly known as: MIRALAX / GLYCOLAX Place 17 g into feeding tube daily as needed for mild constipation.   rOPINIRole 0.5 MG tablet Commonly known as: REQUIP Take 0.5 mg by mouth at bedtime. What changed: Another medication with the same name was removed. Continue taking this medication, and follow the directions you see here.        Allergies  Allergen Reactions   Cephalosporins Itching  TOLERATED ZOSYN (PIPERACILLIN) BEFORE   Hydromorphone Itching   Keflin [Cephalothin] Itching   Lactose Intolerance (Gi) Diarrhea    Consultations:    Procedures/Studies: CT ABDOMEN PELVIS WO CONTRAST  Result Date: 07/26/2021 CLINICAL DATA:  Abdominal pain EXAM: CT ABDOMEN AND PELVIS WITHOUT CONTRAST TECHNIQUE: Multidetector CT imaging of the abdomen and pelvis was performed following the standard protocol without IV contrast. RADIATION DOSE REDUCTION: This exam was performed according to the departmental dose-optimization program which includes automated exposure control, adjustment of the mA and/or kV according to patient size and/or use of iterative reconstruction technique. COMPARISON:  CT chest abdomen pelvis, 04/28/2021 FINDINGS: Lower chest: Extensive heterogeneous ground-glass and consolidative airspace opacity throughout the lung bases (series 5, image 12). Coronary artery calcifications. Hepatobiliary: No focal liver abnormality is seen. Status post cholecystectomy.  No biliary dilatation. 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 Status post distal pancreatectomy. Spleen: Status post splenectomy. Adrenals/Urinary Tract: Adrenal glands are unremarkable. Kidneys are normal, without renal calculi, solid lesion, or hydronephrosis. Bladder is unremarkable. Stomach/Bowel: Stomach is within normal limits. Appendix appears normal. No evidence of bowel wall thickening, distention, or inflammatory changes. Left lower quadrant end colostomy with sigmoid colon resection and rectal stump. Vascular/Lymphatic: Aortic atherosclerosis. No enlarged abdominal or pelvic lymph nodes. Reproductive: No mass or other significant abnormality. Other: No abdominal wall hernia or abnormality. No ascites. Musculoskeletal: No acute or significant osseous findings. IMPRESSION: 1. Extensive heterogeneous ground-glass and consolidative airspace opacity throughout the lung bases, consistent with infection or aspiration. 2. 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 3. Status post splenectomy and distal pancreatectomy. 4. Status post sigmoid colon resection with left lower quadrant end colostomy. 5. Coronary artery disease. Aortic Atherosclerosis (ICD10-I70.0). Electronically Signed   By: Delanna Ahmadi M.D.   On: 07/26/2021 17:20   CT Head Wo Contrast  Result Date: 07/26/2021 CLINICAL DATA:  Altered mental status EXAM: CT HEAD WITHOUT CONTRAST TECHNIQUE: Contiguous axial images were obtained from the base of the skull through the vertex without intravenous contrast. RADIATION DOSE REDUCTION: This exam was performed according to the departmental dose-optimization program which includes automated exposure control, adjustment of the mA and/or kV  according to patient size and/or use of iterative reconstruction technique. COMPARISON:  Head CT 06/25/2021 FINDINGS: Brain: Large remote infarction in the LEFT cerebellum and occipital lobe. No evidence of acute infarction. Extensive subcortical white matter hypodensities. No midline shift or mass effect. No intracranial hemorrhage. Basal cisterns are patent. There are periventricular and subcortical white matter hypodensities. Generalized cortical atrophy. Vascular: No hyperdense vessel or unexpected calcification. Skull: Normal. Negative for fracture or focal lesion. Sinuses/Orbits: Paranasal sinuses and mastoid air cells are clear. Orbits are clear. Other: None. IMPRESSION: 1. No acute intracranial findings. 2. Large LEFT PCA territory infarction unchanged from 06/25/2021 Electronically Signed   By: Suzy Bouchard M.D.   On: 07/26/2021 17:18   DG Chest Port 1 View  Result Date: 07/26/2021 CLINICAL DATA:  61 year old female presenting for evaluation of chest and abdomen pain. EXAM: PORTABLE CHEST 1 VIEW COMPARISON:  Jun 25, 2021. FINDINGS: EKG leads project over the chest. Trachea is midline. Cardiomediastinal contours and hilar structures are normal. Bibasilar airspace disease similar in the RIGHT lower lobe and increased on the LEFT with nodular appearance more so on the  RIGHT than the LEFT. No pneumothorax. No gross evidence of pleural effusion. On limited assessment there is no acute skeletal process. IMPRESSION: Findings suspicious for basilar pneumonia now seen bilaterally. Correlate with any risk factors for or history of aspiration based on distribution. More nodular appearance in the RIGHT lower chest persists. Suggest follow-up to ensure clearing and exclude persistent lung nodule. Electronically Signed   By: Zetta Bills M.D.   On: 07/26/2021 14:34   (Echo, Carotid, EGD, Colonoscopy, ERCP)    Subjective: Pt denies any complaints    Discharge Exam: Vitals:   07/28/21 1145 07/28/21 1222   BP: 93/65 92/70  Pulse: (!) 104 95  Resp: 17 17  Temp: 97.7 F (36.5 C) 98 F (36.7 C)  SpO2: 94% 100%   Vitals:   07/28/21 0801 07/28/21 1142 07/28/21 1145 07/28/21 1222  BP: 1'33/90 93/65 93/65 ' 92/70  Pulse: 95 (!) 101 (!) 104 95  Resp: '17 17 17 17  ' Temp: 97.7 F (36.5 C) 97.7 F (36.5 C) 97.7 F (36.5 C) 98 F (36.7 C)  TempSrc:    Oral  SpO2: 100% 98% 94% 100%  Weight:      Height:        General: Pt is alert, awake, not in acute distress. Appears older than stated age  Cardiovascular: S1/S2 +, no rubs, no gallops Respiratory: decreased breath sounds b/l otherwise clear  Abdominal: Soft, NT, ND, bowel sounds + Extremities: no edema, no cyanosis    The results of significant diagnostics from this hospitalization (including imaging, microbiology, ancillary and laboratory) are listed below for reference.     Microbiology: Recent Results (from the past 240 hour(s))  Culture, blood (Routine x 2)     Status: None (Preliminary result)   Collection Time: 07/26/21  2:00 PM   Specimen: BLOOD  Result Value Ref Range Status   Specimen Description BLOOD BLOOD RIGHT ARM  Final   Special Requests   Final    BOTTLES DRAWN AEROBIC AND ANAEROBIC Blood Culture adequate volume   Culture   Final    NO GROWTH 2 DAYS Performed at [facility], [address], [place], [place] [address]    Report Status PENDING  Incomplete  MRSA Next Gen by PCR, Nasal     Status: None   Collection Time: 07/26/21  6:53 PM   Specimen: Nasal Mucosa; Nasal Swab  Result Value Ref Range Status   MRSA by PCR Next Gen NOT DETECTED NOT DETECTED Final    Comment: (NOTE) The GeneXpert MRSA Assay (FDA approved for NASAL specimens only), is one component of a comprehensive MRSA colonization surveillance program. It is not intended to diagnose MRSA infection nor to guide or monitor treatment for MRSA infections. Test performance is not FDA approved in patients less than 34 years old. Performed at  [facility], [address] [place], [place] [address]   Culture, blood (Routine x 2)     Status: None (Preliminary result)   Collection Time: 07/26/21  9:32 PM   Specimen: BLOOD  Result Value Ref Range Status   Specimen Description BLOOD RIGHT ARM  Final   Special Requests   Final    BOTTLES DRAWN AEROBIC AND ANAEROBIC Blood Culture adequate volume   Culture   Final    NO GROWTH 2 DAYS Performed at [facility], [address] [place], [place] [address]    Report Status PENDING  Incomplete     Labs: BNP (last 3 results) No results for input(s): "  BNP" in the last 8760 hours. Basic Metabolic Panel: Recent Labs  Lab 07/26/21 1557 07/26/21 2132 07/27/21 0138 07/27/21 0529 07/27/21 0822 07/28/21 0426  NA 117* 127* 129* 133* 133* 135  K 5.3* 3.8 3.7 3.6 3.7 3.2*  CL 84* 94* 96* 100 100 99  CO2 14* 19* '25 27 27 30  ' GLUCOSE 736* 441* 266* 163* 137* 309*  BUN 70* 52* 46* 38* 33* 14  CREATININE 1.33* 1.04* 0.75 0.71 0.48 0.56  CALCIUM 9.3 8.4* 8.8* 8.7* 8.6* 8.4*  MG 1.9  --   --   --   --  1.7  PHOS  --   --   --   --   --  1.8*   Liver Function Tests: Recent Labs  Lab 07/26/21 1557  AST 22  ALT 38  ALKPHOS 126  BILITOT 1.5*  PROT 9.0*  ALBUMIN 3.3*   No results for input(s): "LIPASE", "AMYLASE" in the last 168 hours. No results for input(s): "AMMONIA" in the last 168 hours. CBC: Recent Labs  Lab 07/26/21 1453 07/28/21 0426  WBC 11.6* 9.0  NEUTROABS 9.7*  --   HGB 14.3 11.1*  HCT 46.0 33.6*  MCV 88.1 82.2  PLT 440* 379   Cardiac Enzymes: No results for input(s): "CKTOTAL", "CKMB", "CKMBINDEX", "TROPONINI" in the last 168 hours. BNP: Invalid input(s): "POCBNP" CBG: Recent Labs  Lab 07/27/21 1122 07/27/21 1556 07/27/21 2108 07/28/21 0832 07/28/21 1130  GLUCAP 144* 191* 203* 278* 275*   D-Dimer No results for input(s): "DDIMER" in the last 72 hours. Hgb A1c No results for input(s): "HGBA1C" in the last 72 hours. Lipid  Profile No results for input(s): "CHOL", "HDL", "LDLCALC", "TRIG", "CHOLHDL", "LDLDIRECT" in the last 72 hours. Thyroid function studies Recent Labs    07/26/21 2132  TSH 2.191   Anemia work up No results for input(s): "VITAMINB12", "FOLATE", "FERRITIN", "TIBC", "IRON", "RETICCTPCT" in the last 72 hours. Urinalysis    Component Value Date/Time   COLORURINE YELLOW (A) 07/26/2021 1807   APPEARANCEUR HAZY (A) 07/26/2021 1807   APPEARANCEUR Hazy 04/04/2011 1935   LABSPEC 1.022 07/26/2021 1807   LABSPEC >1.060 04/04/2011 1935   PHURINE 5.0 07/26/2021 1807   GLUCOSEU >=500 (A) 07/26/2021 1807   GLUCOSEU 50 mg/dL 04/04/2011 1935   HGBUR NEGATIVE 07/26/2021 1807   BILIRUBINUR NEGATIVE 07/26/2021 1807   BILIRUBINUR Negative 04/04/2011 1935   KETONESUR 5 (A) 07/26/2021 1807   PROTEINUR NEGATIVE 07/26/2021 1807   NITRITE NEGATIVE 07/26/2021 1807   LEUKOCYTESUR NEGATIVE 07/26/2021 1807   LEUKOCYTESUR Negative 04/04/2011 1935   Sepsis Labs Recent Labs  Lab 07/26/21 1453 07/28/21 0426  WBC 11.6* 9.0   Microbiology Recent Results (from the past 240 hour(s))  Culture, blood (Routine x 2)     Status: None (Preliminary result)   Collection Time: 07/26/21  2:00 PM   Specimen: BLOOD  Result Value Ref Range Status   Specimen Description BLOOD BLOOD RIGHT ARM  Final   Special Requests   Final    BOTTLES DRAWN AEROBIC AND ANAEROBIC Blood Culture adequate volume   Culture   Final    NO GROWTH 2 DAYS Performed at [facility], [address], [place], [place] [address]    Report Status PENDING  Incomplete  MRSA Next Gen by PCR, Nasal     Status: None   Collection Time: 07/26/21  6:53 PM   Specimen: Nasal Mucosa; Nasal Swab  Result Value Ref Range Status   MRSA by PCR Next Gen NOT DETECTED  NOT DETECTED Final    Comment: (NOTE) The GeneXpert MRSA Assay (FDA approved for NASAL specimens only), is one component of a comprehensive MRSA colonization surveillance program. It is  not intended to diagnose MRSA infection nor to guide or monitor treatment for MRSA infections. Test performance is not FDA approved in patients less than 2 years old. Performed at [facility], [address] [place], [place] [address]   Culture, blood (Routine x 2)     Status: None (Preliminary result)   Collection Time: 07/26/21  9:32 PM   Specimen: BLOOD  Result Value Ref Range Status   Specimen Description BLOOD RIGHT ARM  Final   Special Requests   Final    BOTTLES DRAWN AEROBIC AND ANAEROBIC Blood Culture adequate volume   Culture   Final    NO GROWTH 2 DAYS Performed at [facility], [address], [place], [place] [address]    Report Status PENDING  Incomplete     Time coordinating discharge: Over 30 minutes  SIGNED:   Wyvonnia Dusky, MD  [facility] 07/28/2021, 12:44 PM Pager   If 7PM-7AM, please contact night-coverage [email or web address]

## 2021-07-28 NOTE — Inpatient Diabetes Management (Addendum)
Inpatient Diabetes Program Recommendations  AACE/ADA: New Consensus Statement on Inpatient Glycemic Control (2015)  Target Ranges:  Prepandial:   less than 140 mg/dL      Peak postprandial:   less than 180 mg/dL (1-2 hours)      Critically ill patients:  140 - 180 mg/dL    Latest Reference Range & Units 07/27/21 07:22 07/27/21 08:20 07/27/21 09:22 07/27/21 10:26 07/27/21 11:22 07/27/21 15:56 07/27/21 21:08  Glucose-Capillary 70 - 99 mg/dL 128 (H) 133 (H) 134 (H)  20 units Semglee given 170 (H) 144 (H)  4 units Novolog '@1214'$  191 (H)  5 units Novolog '@1658'$  203 (H)    Latest Reference Range & Units 07/28/21 08:32  Glucose-Capillary 70 - 99 mg/dL 278 (H)  8 units Novolog  20 units Semglee    History: Diabetes, CVA, Pancreactomy/splenectomy   Home DM Meds: Humalog 5 units TID with meals                             Lantus 10 units QHS   Current Orders: Semglee 20 units Daily      Novolog Sensitive Correction Scale/ SSI (0-9 units) TID AC       Novolog 3 units TID with meals   Note patient transitioned to SQ Insulin yest AM  CBG 278 this AM  MD- Please consider increasing the Semglee to 25 units Daily Please also give extra 5 units Semglee this AM since 20 unit dose already given this AM   Addendum 11:30am--Attempted to call Daughter Aleka Twitty listed on pt's demographic page to discuss/review pt's home insulin regimen.  No one answered and was not able to leave VM.  Will re-attempt at later time if needed.           Patient is well known to the Inpatient Diabetes Team This is the 4th admission to [facility] since January 2023 Per MD notes, daughter administers patient's insulin however patient eats what ever she wants and is not compliant with a diabetic diet.   --Will follow patient during hospitalization--  Wyn Quaker RN, MSN, CDE Diabetes Coordinator Inpatient Glycemic Control Team Team Pager: [phone] (8a-5p)

## 2021-07-28 NOTE — TOC Transition Note (Addendum)
Transition of Care [facility]) - CM/SW Discharge Note   Patient Details  Name: Brittany [name] MRN: [id number] Date of Birth: Jun 01, 1960  Transition of Care [facility] CM/SW Contact:  Alberteen Sam, LCSW Phone Number: 07/28/2021, 12:32 PM   Clinical Narrative:     Update: EMS needed for transport, daughter [name] confirms she will be at home to receive patient. CSW has provided ems forms to RN and EMS has been called, patient is 5th on list. Home address confirmed with [name] as accurate on chart.      Patient to discharge home today with [facility] PT OT and aide as she is active with [facility], CSW has informed [name] with [facility] of dc today.    [facility] needs and patient [facility] for pharmacy.   Taxi voucher printed to floor, RN made aware.    NO further dc needs identified.     Final next level of care: [place] Barriers to Discharge: No Barriers Identified   Patient Goals and CMS Choice Patient states their goals for this hospitalization and ongoing recovery are:: to go home CMS Medicare.gov Compare Post Acute Care list provided to:: Patient Choice offered to / list presented to : Patient  Discharge Placement                    Patient and family notified of of transfer: 07/28/21  Discharge Plan and Services                          HH Arranged: PT, OT, Nurse's Aide [place] Agency: [place] Date [place]: 07/28/21 Time [place]: 1232 Representative spoke with at [place]: cheryl  Social Determinants of Health ([place]) Interventions     Readmission Risk Interventions    03/06/2021    9:13 AM 02/16/2021    2:45 PM  Readmission Risk Prevention Plan  Transportation Screening Complete Complete  Medication Review Press photographer) Complete Complete  PCP or Specialist appointment within 3-5 days of discharge Complete   SW Recovery  Care/Counseling Consult  Complete  Palliative Care Screening Not Applicable Not [place] Not Applicable Not Applicable

## 2021-07-28 NOTE — Care Management Important Message (Signed)
Important Message  Patient Details  Name: Brittany [name] MRN: [id number] Date of Birth: 1960/07/15   Medicare Important Message Given:  N/A - LOS <3 / Initial given by admissions     [name] 07/28/2021, 4:02 PM

## 2021-07-28 NOTE — Progress Notes (Signed)
Pt discharged home via EMS in stable condition. Script sent to pharmacy of choice. Report called to daughter [name]. Understanding verbalized. No immediate questions or concerns at this time.

## 2021-08-01 LAB — CULTURE, BLOOD (ROUTINE X 2)
Culture: NO GROWTH
Culture: NO GROWTH
Special Requests: ADEQUATE
Special Requests: ADEQUATE

## 2021-08-13 ENCOUNTER — Inpatient Hospital Stay
Admission: EM | Admit: 2021-08-13 | Discharge: 2021-08-28 | DRG: 871 | Disposition: A | Discharge status: Skilled Nursing Facility | Payer: Medicare Other | Attending: Internal Medicine | Admitting: Internal Medicine

## 2021-08-13 ENCOUNTER — Inpatient Hospital Stay: Payer: Medicare Other

## 2021-08-13 ENCOUNTER — Other Ambulatory Visit: Payer: Self-pay

## 2021-08-13 ENCOUNTER — Emergency Department: Payer: Medicare Other

## 2021-08-13 DIAGNOSIS — T68XXXA Hypothermia, initial encounter: Secondary | ICD-10-CM

## 2021-08-13 DIAGNOSIS — J449 Chronic obstructive pulmonary disease, unspecified: Secondary | ICD-10-CM | POA: Diagnosis present

## 2021-08-13 DIAGNOSIS — F32A Depression, unspecified: Secondary | ICD-10-CM | POA: Diagnosis present

## 2021-08-13 DIAGNOSIS — R6521 Severe sepsis with septic shock: Secondary | ICD-10-CM | POA: Diagnosis present

## 2021-08-13 DIAGNOSIS — M329 Systemic lupus erythematosus, unspecified: Secondary | ICD-10-CM | POA: Diagnosis present

## 2021-08-13 DIAGNOSIS — D490 Neoplasm of unspecified behavior of digestive system: Secondary | ICD-10-CM | POA: Diagnosis present

## 2021-08-13 DIAGNOSIS — G2581 Restless legs syndrome: Secondary | ICD-10-CM | POA: Diagnosis present

## 2021-08-13 DIAGNOSIS — R739 Hyperglycemia, unspecified: Secondary | ICD-10-CM | POA: Diagnosis not present

## 2021-08-13 DIAGNOSIS — Z8507 Personal history of malignant neoplasm of pancreas: Secondary | ICD-10-CM

## 2021-08-13 DIAGNOSIS — G40909 Epilepsy, unspecified, not intractable, without status epilepticus: Secondary | ICD-10-CM | POA: Diagnosis present

## 2021-08-13 DIAGNOSIS — Z20822 Contact with and (suspected) exposure to covid-19: Secondary | ICD-10-CM | POA: Diagnosis present

## 2021-08-13 DIAGNOSIS — R680 Hypothermia, not associated with low environmental temperature: Secondary | ICD-10-CM | POA: Diagnosis present

## 2021-08-13 DIAGNOSIS — F418 Other specified anxiety disorders: Secondary | ICD-10-CM | POA: Diagnosis present

## 2021-08-13 DIAGNOSIS — E11649 Type 2 diabetes mellitus with hypoglycemia without coma: Secondary | ICD-10-CM | POA: Diagnosis not present

## 2021-08-13 DIAGNOSIS — R6520 Severe sepsis without septic shock: Secondary | ICD-10-CM | POA: Diagnosis not present

## 2021-08-13 DIAGNOSIS — E119 Type 2 diabetes mellitus without complications: Principal | ICD-10-CM

## 2021-08-13 DIAGNOSIS — E1010 Type 1 diabetes mellitus with ketoacidosis without coma: Secondary | ICD-10-CM | POA: Diagnosis not present

## 2021-08-13 DIAGNOSIS — G9341 Metabolic encephalopathy: Secondary | ICD-10-CM | POA: Diagnosis present

## 2021-08-13 DIAGNOSIS — Z681 Body mass index (BMI) 19 or less, adult: Secondary | ICD-10-CM

## 2021-08-13 DIAGNOSIS — Z933 Colostomy status: Secondary | ICD-10-CM

## 2021-08-13 DIAGNOSIS — E876 Hypokalemia: Secondary | ICD-10-CM | POA: Diagnosis not present

## 2021-08-13 DIAGNOSIS — N179 Acute kidney failure, unspecified: Secondary | ICD-10-CM | POA: Diagnosis present

## 2021-08-13 DIAGNOSIS — Z8701 Personal history of pneumonia (recurrent): Secondary | ICD-10-CM | POA: Diagnosis not present

## 2021-08-13 DIAGNOSIS — E871 Hypo-osmolality and hyponatremia: Secondary | ICD-10-CM | POA: Diagnosis present

## 2021-08-13 DIAGNOSIS — E782 Mixed hyperlipidemia: Secondary | ICD-10-CM | POA: Diagnosis not present

## 2021-08-13 DIAGNOSIS — F419 Anxiety disorder, unspecified: Secondary | ICD-10-CM | POA: Diagnosis present

## 2021-08-13 DIAGNOSIS — J690 Pneumonitis due to inhalation of food and vomit: Secondary | ICD-10-CM | POA: Diagnosis present

## 2021-08-13 DIAGNOSIS — I959 Hypotension, unspecified: Secondary | ICD-10-CM

## 2021-08-13 DIAGNOSIS — I10 Essential (primary) hypertension: Secondary | ICD-10-CM | POA: Diagnosis present

## 2021-08-13 DIAGNOSIS — Z87891 Personal history of nicotine dependence: Secondary | ICD-10-CM

## 2021-08-13 DIAGNOSIS — Z9081 Acquired absence of spleen: Secondary | ICD-10-CM

## 2021-08-13 DIAGNOSIS — Z794 Long term (current) use of insulin: Secondary | ICD-10-CM

## 2021-08-13 DIAGNOSIS — Z8673 Personal history of transient ischemic attack (TIA), and cerebral infarction without residual deficits: Secondary | ICD-10-CM | POA: Diagnosis not present

## 2021-08-13 DIAGNOSIS — E875 Hyperkalemia: Secondary | ICD-10-CM | POA: Diagnosis present

## 2021-08-13 DIAGNOSIS — A419 Sepsis, unspecified organism: Principal | ICD-10-CM | POA: Diagnosis present

## 2021-08-13 DIAGNOSIS — I69351 Hemiplegia and hemiparesis following cerebral infarction affecting right dominant side: Secondary | ICD-10-CM

## 2021-08-13 DIAGNOSIS — E785 Hyperlipidemia, unspecified: Secondary | ICD-10-CM | POA: Diagnosis present

## 2021-08-13 DIAGNOSIS — Z818 Family history of other mental and behavioral disorders: Secondary | ICD-10-CM

## 2021-08-13 DIAGNOSIS — E1110 Type 2 diabetes mellitus with ketoacidosis without coma: Secondary | ICD-10-CM | POA: Diagnosis present

## 2021-08-13 DIAGNOSIS — R1310 Dysphagia, unspecified: Secondary | ICD-10-CM | POA: Diagnosis present

## 2021-08-13 DIAGNOSIS — R64 Cachexia: Secondary | ICD-10-CM | POA: Diagnosis present

## 2021-08-13 DIAGNOSIS — E43 Unspecified severe protein-calorie malnutrition: Secondary | ICD-10-CM | POA: Diagnosis present

## 2021-08-13 DIAGNOSIS — E440 Moderate protein-calorie malnutrition: Secondary | ICD-10-CM | POA: Diagnosis present

## 2021-08-13 DIAGNOSIS — Z90410 Acquired total absence of pancreas: Secondary | ICD-10-CM

## 2021-08-13 LAB — BLOOD GAS, ARTERIAL
Acid-base deficit: 22.5 mmol/L — ABNORMAL HIGH (ref 0.0–2.0)
Bicarbonate: 4.1 mmol/L — ABNORMAL LOW (ref 20.0–28.0)
FIO2: 21 %
O2 Saturation: 96.8 %
Patient temperature: 31.6
pCO2 arterial: <18 mmHg — CL (ref 32–48)
pH, Arterial: 7.21 — ABNORMAL LOW (ref 7.35–7.45)
pO2, Arterial: 100 mmHg (ref 83–108)

## 2021-08-13 LAB — BLOOD GAS, VENOUS
Acid-base deficit: 6 mmol/L — ABNORMAL HIGH (ref 0.0–2.0)
Bicarbonate: 16.6 mmol/L — ABNORMAL LOW (ref 20.0–28.0)
O2 Saturation: 96.7 %
Patient temperature: 37
pCO2, Ven: 25 mmHg — ABNORMAL LOW (ref 44–60)
pH, Ven: 7.43 (ref 7.25–7.43)
pO2, Ven: 83 mmHg — ABNORMAL HIGH (ref 32–45)

## 2021-08-13 LAB — COMPREHENSIVE METABOLIC PANEL
ALT: 30 U/L (ref 0–44)
ALT: 27 U/L (ref 0–44)
AST: 24 U/L (ref 15–41)
AST: 24 U/L (ref 15–41)
Albumin: 2.6 g/dL — ABNORMAL LOW (ref 3.5–5.0)
Albumin: 2.3 g/dL — ABNORMAL LOW (ref 3.5–5.0)
Alkaline Phosphatase: 118 U/L (ref 38–126)
Alkaline Phosphatase: 101 U/L (ref 38–126)
Anion gap: 22 — ABNORMAL HIGH (ref 5–15)
BUN: 28 mg/dL — ABNORMAL HIGH (ref 8–23)
BUN: 26 mg/dL — ABNORMAL HIGH (ref 8–23)
CO2: <7 mmol/L — ABNORMAL LOW (ref 22–32)
CO2: 12 mmol/L — ABNORMAL LOW (ref 22–32)
Calcium: 8.9 mg/dL (ref 8.9–10.3)
Calcium: 8.4 mg/dL — ABNORMAL LOW (ref 8.9–10.3)
Chloride: 99 mmol/L (ref 98–111)
Chloride: 102 mmol/L (ref 98–111)
Creatinine, Ser: 1.38 mg/dL — ABNORMAL HIGH (ref 0.44–1.00)
Creatinine, Ser: 1.19 mg/dL — ABNORMAL HIGH (ref 0.44–1.00)
GFR, Estimated: 44 mL/min — ABNORMAL LOW (ref >60)
GFR, Estimated: 52 mL/min — ABNORMAL LOW (ref >60)
Glucose, Bld: 592 mg/dL (ref 70–99)
Glucose, Bld: 450 mg/dL — ABNORMAL HIGH (ref 70–99)
Potassium: 4.8 mmol/L (ref 3.5–5.1)
Potassium: 3.4 mmol/L — ABNORMAL LOW (ref 3.5–5.1)
Sodium: 131 mmol/L — ABNORMAL LOW (ref 135–145)
Sodium: 136 mmol/L (ref 135–145)
Total Bilirubin: 1.6 mg/dL — ABNORMAL HIGH (ref 0.3–1.2)
Total Bilirubin: 1.9 mg/dL — ABNORMAL HIGH (ref 0.3–1.2)
Total Protein: 6.9 g/dL (ref 6.5–8.1)
Total Protein: 6.3 g/dL — ABNORMAL LOW (ref 6.5–8.1)

## 2021-08-13 LAB — CBC WITH DIFFERENTIAL/PLATELET
Abs Immature Granulocytes: 0.07 10*3/uL (ref 0.00–0.07)
Basophils Absolute: 0 10*3/uL (ref 0.0–0.1)
Basophils Relative: 0 %
Eosinophils Absolute: 0.1 10*3/uL (ref 0.0–0.5)
Eosinophils Relative: 1 %
HCT: 37.5 % (ref 36.0–46.0)
Hemoglobin: 11 g/dL — ABNORMAL LOW (ref 12.0–15.0)
Immature Granulocytes: 1 %
Lymphocytes Relative: 8 %
Lymphs Abs: 1 10*3/uL (ref 0.7–4.0)
MCH: 27.3 pg (ref 26.0–34.0)
MCHC: 29.3 g/dL — ABNORMAL LOW (ref 30.0–36.0)
MCV: 93.1 fL (ref 80.0–100.0)
Monocytes Absolute: 0.5 10*3/uL (ref 0.1–1.0)
Monocytes Relative: 4 %
Neutro Abs: 11.3 10*3/uL — ABNORMAL HIGH (ref 1.7–7.7)
Neutrophils Relative %: 86 %
Platelets: 322 10*3/uL (ref 150–400)
RBC: 4.03 MIL/uL (ref 3.87–5.11)
RDW: 17.2 % — ABNORMAL HIGH (ref 11.5–15.5)
WBC: 13 10*3/uL — ABNORMAL HIGH (ref 4.0–10.5)
nRBC: 0 % (ref 0.0–0.2)

## 2021-08-13 LAB — GASTROINTESTINAL PANEL BY PCR, STOOL (REPLACES STOOL CULTURE)

## 2021-08-13 LAB — URINE DRUG SCREEN, QUALITATIVE (ARMC ONLY)
Amphetamines, Ur Screen: NOT DETECTED
Barbiturates, Ur Screen: NOT DETECTED
Benzodiazepine, Ur Scrn: NOT DETECTED
Cannabinoid 50 Ng, Ur ~~LOC~~: NOT DETECTED
Cocaine Metabolite,Ur ~~LOC~~: NOT DETECTED
MDMA (Ecstasy)Ur Screen: NOT DETECTED
Methadone Scn, Ur: NOT DETECTED
Opiate, Ur Screen: NOT DETECTED
Phencyclidine (PCP) Ur S: NOT DETECTED
Tricyclic, Ur Screen: NOT DETECTED

## 2021-08-13 LAB — URINALYSIS, COMPLETE (UACMP) WITH MICROSCOPIC
Bilirubin Urine: NEGATIVE
Glucose, UA: >=500 mg/dL — AB
Ketones, ur: 80 mg/dL — AB
Leukocytes,Ua: NEGATIVE
Nitrite: NEGATIVE
Protein, ur: 100 mg/dL — AB
Specific Gravity, Urine: 1.02 (ref 1.005–1.030)
pH: 5 (ref 5.0–8.0)

## 2021-08-13 LAB — SODIUM, URINE, RANDOM: Sodium, Ur: 77 mmol/L

## 2021-08-13 LAB — BASIC METABOLIC PANEL
Anion gap: 16 — ABNORMAL HIGH (ref 5–15)
BUN: 25 mg/dL — ABNORMAL HIGH (ref 8–23)
CO2: 19 mmol/L — ABNORMAL LOW (ref 22–32)
Calcium: 8.5 mg/dL — ABNORMAL LOW (ref 8.9–10.3)
Chloride: 105 mmol/L (ref 98–111)
Creatinine, Ser: 1.2 mg/dL — ABNORMAL HIGH (ref 0.44–1.00)
GFR, Estimated: 52 mL/min — ABNORMAL LOW (ref >60)
Glucose, Bld: 225 mg/dL — ABNORMAL HIGH (ref 70–99)
Potassium: 3.3 mmol/L — ABNORMAL LOW (ref 3.5–5.1)
Sodium: 140 mmol/L (ref 135–145)

## 2021-08-13 LAB — CBG MONITORING, ED
Glucose-Capillary: 562 mg/dL (ref 70–99)
Glucose-Capillary: 519 mg/dL (ref 70–99)
Glucose-Capillary: 539 mg/dL (ref 70–99)

## 2021-08-13 LAB — STREP PNEUMONIAE URINARY ANTIGEN: Strep Pneumo Urinary Antigen: NEGATIVE

## 2021-08-13 LAB — C DIFFICILE QUICK SCREEN W PCR REFLEX
C Diff antigen: NEGATIVE
C Diff interpretation: NOT DETECTED
C Diff toxin: NEGATIVE

## 2021-08-13 LAB — APTT: aPTT: 31 seconds (ref 24–36)

## 2021-08-13 LAB — PHOSPHORUS: Phosphorus: 4.8 mg/dL — ABNORMAL HIGH (ref 2.5–4.6)

## 2021-08-13 LAB — RESP PANEL BY RT-PCR (FLU A&B, COVID) ARPGX2
Influenza A by PCR: NEGATIVE
Influenza B by PCR: NEGATIVE
SARS Coronavirus 2 by RT PCR: NEGATIVE

## 2021-08-13 LAB — TROPONIN I (HIGH SENSITIVITY): Troponin I (High Sensitivity): 39 ng/L — ABNORMAL HIGH (ref <18)

## 2021-08-13 LAB — CORTISOL: Cortisol, Plasma: 34.1 ug/dL

## 2021-08-13 LAB — BETA-HYDROXYBUTYRIC ACID: Beta-Hydroxybutyric Acid: 5.19 mmol/L — ABNORMAL HIGH (ref 0.05–0.27)

## 2021-08-13 LAB — GLUCOSE, CAPILLARY
Glucose-Capillary: 189 mg/dL — ABNORMAL HIGH (ref 70–99)
Glucose-Capillary: 194 mg/dL — ABNORMAL HIGH (ref 70–99)
Glucose-Capillary: 218 mg/dL — ABNORMAL HIGH (ref 70–99)
Glucose-Capillary: 223 mg/dL — ABNORMAL HIGH (ref 70–99)
Glucose-Capillary: 240 mg/dL — ABNORMAL HIGH (ref 70–99)
Glucose-Capillary: 333 mg/dL — ABNORMAL HIGH (ref 70–99)
Glucose-Capillary: 366 mg/dL — ABNORMAL HIGH (ref 70–99)
Glucose-Capillary: 445 mg/dL — ABNORMAL HIGH (ref 70–99)
Glucose-Capillary: 479 mg/dL — ABNORMAL HIGH (ref 70–99)

## 2021-08-13 LAB — MAGNESIUM: Magnesium: 1.5 mg/dL — ABNORMAL LOW (ref 1.7–2.4)

## 2021-08-13 LAB — PROTIME-INR
INR: 1.3 — ABNORMAL HIGH (ref 0.8–1.2)
Prothrombin Time: 15.6 seconds — ABNORMAL HIGH (ref 11.4–15.2)

## 2021-08-13 LAB — ETHANOL: Alcohol, Ethyl (B): <10 mg/dL (ref <10)

## 2021-08-13 LAB — LACTIC ACID, PLASMA
Lactic Acid, Venous: 3.7 mmol/L (ref 0.5–1.9)
Lactic Acid, Venous: 3.1 mmol/L (ref 0.5–1.9)

## 2021-08-13 LAB — OSMOLALITY, URINE: Osmolality, Ur: 390 mOsm/kg (ref 300–900)

## 2021-08-13 LAB — CK: Total CK: 680 U/L — ABNORMAL HIGH (ref 38–234)

## 2021-08-13 LAB — MRSA NEXT GEN BY PCR, NASAL: MRSA by PCR Next Gen: NOT DETECTED

## 2021-08-13 LAB — TSH: TSH: 1.623 u[IU]/mL (ref 0.350–4.500)

## 2021-08-13 MED ORDER — LACTATED RINGERS IV BOLUS
1000.0000 mL | Freq: Once | INTRAVENOUS | Status: AC
  Administered 2021-08-13: 1000 mL via INTRAVENOUS

## 2021-08-13 MED ORDER — SODIUM BICARBONATE 8.4 % IV SOLN
150.0000 meq | Freq: Once | INTRAVENOUS | Status: AC
  Administered 2021-08-13: 150 meq via INTRAVENOUS
  Filled 2021-08-13: qty 50
  Filled 2021-08-13: qty 150

## 2021-08-13 MED ORDER — SODIUM CHLORIDE 0.9 % IV SOLN
250.0000 mL | INTRAVENOUS | Status: DC
  Administered 2021-08-13: 250 mL via INTRAVENOUS

## 2021-08-13 MED ORDER — SODIUM CHLORIDE 0.9 % IV SOLN: 2.0000 g | Freq: Once | INTRAVENOUS | Status: DC

## 2021-08-13 MED ORDER — HEPARIN SODIUM (PORCINE) 5000 UNIT/ML IJ SOLN
5000.0000 [IU] | Freq: Three times a day (TID) | INTRAMUSCULAR | Status: DC
Start: 2021-08-13 — End: 2021-08-14
  Administered 2021-08-13 – 2021-08-14 (×2): 5000 [IU] via SUBCUTANEOUS
  Filled 2021-08-13 (×2): qty 1

## 2021-08-13 MED ORDER — DEXTROSE 50 % IV SOLN: 0.0000 mL | INTRAVENOUS | Status: DC | PRN

## 2021-08-13 MED ORDER — DEXTROSE IN LACTATED RINGERS 5 % IV SOLN
INTRAVENOUS | Status: DC
Start: 2021-08-13 — End: 2021-08-14

## 2021-08-13 MED ORDER — LACTATED RINGERS IV SOLN: INTRAVENOUS | Status: DC

## 2021-08-13 MED ORDER — PIPERACILLIN-TAZOBACTAM 3.375 G IVPB 30 MIN
3.3750 g | Freq: Four times a day (QID) | INTRAVENOUS | Status: DC
Start: 2021-08-13 — End: 2021-08-13

## 2021-08-13 MED ORDER — SODIUM CHLORIDE 0.9 % IV SOLN
2.0000 g | Freq: Once | INTRAVENOUS | Status: AC
  Administered 2021-08-13: 2 g via INTRAVENOUS
  Filled 2021-08-13: qty 2
  Filled 2021-08-13: qty 12.5

## 2021-08-13 MED ORDER — NOREPINEPHRINE 4 MG/250ML-% IV SOLN
2.0000 ug/min | INTRAVENOUS | Status: DC
  Administered 2021-08-13: 2 ug/min via INTRAVENOUS
  Filled 2021-08-13: qty 250

## 2021-08-13 MED ORDER — LACTATED RINGERS IV BOLUS (SEPSIS)
1000.0000 mL | Freq: Once | INTRAVENOUS | Status: AC
Start: 2021-08-13 — End: 2021-08-13
  Administered 2021-08-13: 1000 mL via INTRAVENOUS

## 2021-08-13 MED ORDER — ONDANSETRON HCL 4 MG/2ML IJ SOLN: 4.0000 mg | Freq: Four times a day (QID) | INTRAMUSCULAR | Status: DC | PRN

## 2021-08-13 MED ORDER — METRONIDAZOLE 500 MG/100ML IV SOLN
500.0000 mg | Freq: Once | INTRAVENOUS | Status: AC
  Administered 2021-08-13: 500 mg via INTRAVENOUS
  Filled 2021-08-13 (×2): qty 100

## 2021-08-13 MED ORDER — FAMOTIDINE 20 MG PO TABS: 20.0000 mg | ORAL_TABLET | Freq: Two times a day (BID) | ORAL | Status: DC

## 2021-08-13 MED ORDER — MAGNESIUM SULFATE 4 GM/100ML IV SOLN
4.0000 g | Freq: Once | INTRAVENOUS | Status: AC
  Administered 2021-08-13: 4 g via INTRAVENOUS
  Filled 2021-08-13: qty 100

## 2021-08-13 MED ORDER — DOCUSATE SODIUM 100 MG PO CAPS: 100.0000 mg | ORAL_CAPSULE | Freq: Two times a day (BID) | ORAL | Status: DC | PRN

## 2021-08-13 MED ORDER — ALBUMIN HUMAN 25 % IV SOLN
25.0000 g | Freq: Four times a day (QID) | INTRAVENOUS | Status: AC
  Administered 2021-08-13 – 2021-08-14 (×2): 25 g via INTRAVENOUS
  Filled 2021-08-13: qty 50
  Filled 2021-08-13 (×2): qty 100

## 2021-08-13 MED ORDER — POLYETHYLENE GLYCOL 3350 17 G PO PACK: 17.0000 g | PACK | Freq: Every day | ORAL | Status: DC | PRN

## 2021-08-13 MED ORDER — IPRATROPIUM-ALBUTEROL 0.5-2.5 (3) MG/3ML IN SOLN: 3.0000 mL | Freq: Four times a day (QID) | RESPIRATORY_TRACT | Status: DC | PRN

## 2021-08-13 MED ORDER — FAMOTIDINE IN NACL 20-0.9 MG/50ML-% IV SOLN
20.0000 mg | INTRAVENOUS | Status: DC
  Administered 2021-08-13 – 2021-08-17 (×5): 20 mg via INTRAVENOUS
  Filled 2021-08-13 (×6): qty 50

## 2021-08-13 MED ORDER — VANCOMYCIN HCL IN DEXTROSE 1-5 GM/200ML-% IV SOLN
1000.0000 mg | Freq: Once | INTRAVENOUS | Status: AC
  Administered 2021-08-13: 1000 mg via INTRAVENOUS
  Filled 2021-08-13 (×2): qty 200

## 2021-08-13 MED ORDER — POTASSIUM CHLORIDE 10 MEQ/100ML IV SOLN
10.0000 meq | INTRAVENOUS | Status: AC
  Administered 2021-08-13 – 2021-08-14 (×4): 10 meq via INTRAVENOUS
  Filled 2021-08-13 (×4): qty 100

## 2021-08-13 MED ORDER — LACTATED RINGERS IV BOLUS (SEPSIS)
500.0000 mL | Freq: Once | INTRAVENOUS | Status: AC
  Administered 2021-08-13: 500 mL via INTRAVENOUS

## 2021-08-13 MED ORDER — VANCOMYCIN HCL 750 MG/150ML IV SOLN: 750.0000 mg | INTRAVENOUS | Status: DC

## 2021-08-13 MED ORDER — PIPERACILLIN-TAZOBACTAM 3.375 G IVPB
3.3750 g | Freq: Three times a day (TID) | INTRAVENOUS | Status: AC
  Administered 2021-08-13 – 2021-08-14 (×4): 3.375 g via INTRAVENOUS
  Filled 2021-08-13 (×4): qty 50

## 2021-08-13 MED ORDER — ACETAMINOPHEN 325 MG PO TABS
650.0000 mg | ORAL_TABLET | Freq: Once | ORAL | Status: AC
  Administered 2021-08-13: 650 mg via ORAL

## 2021-08-13 MED ORDER — POTASSIUM CHLORIDE 10 MEQ/100ML IV SOLN
10.0000 meq | INTRAVENOUS | Status: AC
  Administered 2021-08-13: 10 meq via INTRAVENOUS
  Filled 2021-08-13: qty 100

## 2021-08-13 MED ORDER — INSULIN REGULAR(HUMAN) IN NACL 100-0.9 UT/100ML-% IV SOLN
INTRAVENOUS | Status: DC
  Administered 2021-08-13: 6 [IU]/h via INTRAVENOUS
  Filled 2021-08-13: qty 100

## 2021-08-13 MED ORDER — SODIUM CHLORIDE 0.9 % IV SOLN: INTRAVENOUS | Status: DC | PRN

## 2021-08-13 NOTE — Progress Notes (Signed)
There was order for Levophed infusion. Talked patient's RN Erlene Quan regarding inserting USG PIV access if levophed needs to be infusing. Erlene Quan RN stated that he doesn't have a plan to start to infuse levophed at this time. He will the consult IV team if patient is getting levophed. HS [employer]

## 2021-08-13 NOTE — Progress Notes (Addendum)
Pharmacy Antibiotic Note  Brittany [name] is a 61 y.o. female w/ PMH of DM, HLD, HTN, RLS, Pancreatic Cancer s/p Distal pancreatectomy and splenectomy, recent PNA, CVA, dysphagia admitted on 08/13/2021 with sepsis.  Pharmacy has been consulted for vancomycin dosing.  Plan: start vancomycin 1000 mg IV x 1;  based on current renal status next dose would be in 36-48hrs.  Reassess renal status with AM labs to determine regimen VS dose by levels. (Pt also on Zosyn; CTM Scr) (Maintenance dose NOT ordered)    Weight: 42 kg (92 lb 9.5 oz)  Temp (24hrs), Avg:85 F (29.4 C), Min:82.2 F (27.9 C), Max:87.8 F (31 C)  Recent Labs  Lab 08/13/21 1145  WBC 13.0*  LATICACIDVEN 3.7*    Estimated Creatinine Clearance: 49 mL/min (by C-G formula based on SCr of 0.56 mg/dL).    Allergies  Allergen Reactions   Cephalosporins Itching    TOLERATED ZOSYN (PIPERACILLIN) BEFORE   Hydromorphone Itching   Keflin [Cephalothin] Itching   Lactose Intolerance (Gi) Diarrhea    Antimicrobials this admission: 07/02 vancomycin >>  07/02 Zosyn >>   Microbiology results: 07/02 BCx: pending 07/02 UCx: pending  07/02 MRSA: Pending  Thank you for allowing pharmacy to be a part of this patient's care.  Dallie Piles 08/13/2021 2:27 PM

## 2021-08-13 NOTE — ED Notes (Signed)
Unable to get blood return on IV's. RN notified provider. Per MD, set up for central line placement.

## 2021-08-13 NOTE — Progress Notes (Signed)
Pharmacy Antibiotic Note  Brittany [name] is a 61 y.o. female w/ PMH of DM, HLD, HTN, RLS, Pancreatic Cancer s/p Distal pancreatectomy and splenectomy, recent PNA, CVA, dysphagia admitted on 08/13/2021 with sepsis.    Pharmacy has been consulted for vancomycin and Zosyn dosing.  Plan:  start vancomycin 1000 mg IV x 1;  based on current renal status next dose would be in 36-48hrs.  Reassess renal status with AM labs to determine regimen VS dose by levels. (Pt also on Zosyn; CTM Scr) (Maintenance dose NOT ordered)   2. Zosyn 3.375 gm IVPB q 8hrs (4hr infusion)  Weight: 42 kg (92 lb 9.5 oz)  Temp (24hrs), Avg:86.7 F (30.4 C), Min:82.2 F (27.9 C), Max:90.7 F (32.6 C)  Recent Labs  Lab 08/13/21 1145  WBC 13.0*  CREATININE 1.38*  LATICACIDVEN 3.7*     Estimated Creatinine Clearance: 28.4 mL/min (A) (by C-G formula based on SCr of 1.38 mg/dL (H)).    Allergies  Allergen Reactions   Cephalosporins Itching    TOLERATED ZOSYN (PIPERACILLIN) BEFORE   Hydromorphone Itching   Keflin [Cephalothin] Itching   Lactose Intolerance (Gi) Diarrhea    Antimicrobials this admission: 07/02 vancomycin >>  07/02 Zosyn >>   Microbiology results: 07/02 BCx: pending 07/02 UCx: pending  07/02 MRSA: Pending  Thank you for allowing pharmacy to be a part of this patient's care.  Brittany [name] 08/13/2021 4:22 PM

## 2021-08-13 NOTE — H&P (Signed)
NAME:  KEYARI KLEEMAN, MRN:  [id number], DOB:  1960/06/11, LOS: 0 ADMISSION DATE:  08/13/2021, CONSULTATION DATE: 08/13/21 REFERRING MD: Dr. Cinda Quest, CHIEF COMPLAINT: AMS   History of Present Illness:  This is a 61 yo female who presented to [facility] ER via EMS on 07/2 with altered mental status.  Pts daughter reported to EMS the pt was "fine" at 08:30 am today, however she later found her on the floor prompting EMS notification.  Per EMS run sheet upon their arrival at pts home she was found pale, cold, and clammy.  Her CBG readings were 442 and EMS was unable to obtain a temperature.  Pt placed on 6L nasal canula.  En route to the ER code sepsis initiated and pt received 500 ml iv fluid bolus. Pt remained altered and placed on 15L NRB.  She was recently hospitalized between 06/14 to 06/16 following treatment of CAP and DKA.    ED Course In the ER pt alert but disoriented.  ED vital signs were: bp 74/64, hr 105, resp rate 13, pt hypothermic, and O2 sats were 100%.  Sepsis protocol initiated: pt received cefepime, flagyl, vancomycin, 2.5 LR fluid bolus.  Labs were obtained.  CMP: Na+ 131, CO2 <7, glucose 592, BUN 28, creatinine 1.38, anion gap not calculated, albumin 2.6, total bilirubin 1.6, lactic acid 3.7, and pct 0.11 CBC: WBC 13.0 and Hgb 11.0 CXR: Increased right lower lobe infiltrate, suspicious for pneumonia   Pt ruled in for DKA and insulin gtt initiated and prn levophed ordered for hypotension.  PCCM team contacted for ICU admission.  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  Dysphagia Severe Protein-Calorie Malnutrition  Tobacco Abuse  Rest Leg Syndrome  DKA   Significant Hospital Events: Including procedures,  antibiotic start and stop dates in addition to other pertinent events   07/2: Pt admitted to the stepdown unit with acute metabolic encephalopathy, severe sepsis with septic shock secondary to pneumonia likely aspiration, hypothermia, and DKA    Micro Data:  Blood x2 07/2>> Urine 07/2>> GI panel by PCR 07/2>>negative Cdiff 07/2>>negative Resp Panel by RT-PCR 07/2>>negative  MRSA PCR 07/2>>  Antimicrobials:   Cefepime 07/2 x1 dose  Vancomycin 07/2 x1 dose Zosyn 07/2>>  Interim History / Subjective:  Pt confused and unable to follow commands.  She is currently not requiring levophed gtt map >65.  O2 sats '@100'$ % with no signs of respiratory distress   Objective   Blood pressure (!) 83/60, pulse 66, temperature (!) 86.1 F (30.1 C), resp. rate 12, weight 42 kg, SpO2 100 %.       No intake or output data in the 24 hours ending 08/13/21 1337 Filed Weights   08/13/21 1138  Weight: 42 kg   Examination: General: Acute on chronically ill appearing cachetic female, NAD  HENT: Supple, no JVD Lungs: Faint rhonchi throughout, even, non labored  Cardiovascular: Irregular irregular, 2+ radial/1+ distal pulses, no edema  Abdomen: Left quadrant colostomy draining stool/stoma pink, +BS x4, soft, non tender, non distended, midline abdominal scar  Extremities: Cachetic moving all extremities  Neuro: Confused, not following commands, PERRLA   Resolved Hospital Problem list     Assessment & Plan:  Acute respiratory failure secondary to RLL pneumonia likely aspiration  - Supplemental O2 for dyspnea  and/or hypoxia  - Prn bronchodilator therapy  - Trend WBC and monitor fever curve - Trend PCT  - Follow cultures - Will start zosyn; if MRSA PCR negative will discontinue vancomycin   Hypotension secondary to hypovolemic and septic shock  Hx: Stroke and dyslipidemia  - Continuous telemetry monitoring  - Aggressive iv fluid resuscitation and prn levophed gtt to maintain map >65 - Troponin  pending  - Resume outpatient atorvastatin and aspirin once pt able to tolerate po's  Acute kidney injury secondary to ATN in the setting of hypotension  Hyponatremia secondary to volume depletion  Mild rhabdomyolysis  Anion gap metabolic acidosis  - Trend BMP and VBG - Lactic acid pending  - Replace electrolytes as indicated  - Monitor UOP - Avoid nephrotoxic medications - Aggressive iv fluid resuscitation  - 3 amps of sodium bicarb   Hypothermia  TSH 08/13/21: 1.623 - Prn bear hugger to maintain normothermia - Cortisol level pending    Diabetic ketoacidosis Poorly controlled Type II DM - BMP q4hrs and beta-hydroxybutyric acid q8hrs while on insulin gtt  - IV fluids per DKA protocol  - Hemoglobin A1c pending  - Diabetes coordinator consult appreciate input   Severe protein malnutrition  Hx: Pancreatic Cancer s/p Distal Pancreatectomy - Will consult dietitian  - Resume outpatient pancrelipase once able to tolerate po's   Acute metabolic encephalopathy  - CT Head results pending - Avoid sedating medications  - Supportive care   Best Practice (right click and "Reselect all SmartList Selections" daily)   Diet/type: NPO DVT prophylaxis: prophylactic heparin  GI prophylaxis: H2B Lines: N/A Foley:  Yes and still needed  Code Status:  full code Last date of multidisciplinary goals of care discussion [N/A]  Attempted to contact pts daughter Nakyah Erdmann, however the phone number is inaccurate in epic.  Updated pts mother Jefferey Pica via telephone regarding pts condition and current plan of care  Labs   CBC: No results for input(s): "WBC", "NEUTROABS", "HGB", "HCT", "MCV", "PLT" in the last 168 hours.  Basic Metabolic Panel: No results for input(s): "NA", "K", "CL", "CO2", "GLUCOSE", "BUN", "CREATININE", "CALCIUM", "MG", "PHOS" in the last 168 hours. GFR: Estimated Creatinine Clearance: 49 mL/min (by C-G formula based on SCr of 0.56 mg/dL). No results for input(s):  "PROCALCITON", "WBC", "LATICACIDVEN" in the last 168 hours.  Liver Function Tests: No results for input(s): "AST", "ALT", "ALKPHOS", "BILITOT", "PROT", "ALBUMIN" in the last 168 hours. No results for input(s): "LIPASE", "AMYLASE" in the last 168 hours. No results for input(s): "AMMONIA" in the last 168 hours.  ABG    Component Value Date/Time   PHART 7.448 05/15/2021 1355   PCO2ART 30.5 (L) 05/15/2021 1355   PO2ART 48 (L) 05/15/2021 1355   HCO3 13.5 (L) 07/26/2021 1356   TCO2 22 05/15/2021 1355   ACIDBASEDEF 14.4 (H) 07/26/2021 1356   O2SAT 68.7 07/26/2021 1356     Coagulation Profile: No results for input(s): "INR", "PROTIME" in the last 168 hours.  Cardiac Enzymes: No results for input(s): "CKTOTAL", "CKMB", "CKMBINDEX", "TROPONINI" in the last 168 hours.  HbA1C: Hgb A1c MFr Bld  Date/Time Value Ref Range Status  04/06/2021 01:12 AM 15.0 (H) 4.8 - 5.6 % Final    Comment:    (NOTE)         Prediabetes: 5.7 - 6.4         Diabetes: >6.4         Glycemic control for adults with diabetes: <7.0   11/06/2020 04:53 AM 15.3 (H)  4.8 - 5.6 % Final    Comment:    (NOTE)         Prediabetes: 5.7 - 6.4         Diabetes: >6.4         Glycemic control for adults with diabetes: <7.0     CBG: Recent Labs  Lab 08/13/21 1149  GLUCAP 562*    Review of Systems:   Unable to assess pt encephalopathic   Past Medical History:  She,  has a past medical history of Colostomy in place [facility]), Diabetes mellitus without complication ([place]), Dyslipidemia, Hypertension, Lupus ([place]), Pancreatic cancer ([place]), RLS (restless legs syndrome), Seizure disorder ([place]), and 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   Surgical History:   Past Surgical History:  Procedure Laterality Date   COLECTOMY WITH COLOSTOMY CREATION/HARTMANN PROCEDURE N/A 03/10/2021   Procedure: COLECTOMY WITH COLOSTOMY CREATION/HARTMANN PROCEDURE;  Surgeon: Jules Husbands, MD;  Location: [facility] ORS;   Service: General;  Laterality: N/A;   PANCREATECTOMY     spleenectomy       Social History:   reports that she has quit smoking. Her smoking use included cigarettes. She has been exposed to tobacco smoke. She has never used smokeless tobacco. She reports current alcohol use. She reports that she does not currently use drugs.   Family History:  Her family history includes Anxiety disorder in her sister; Breast cancer in her maternal aunt.   Allergies Allergies  Allergen Reactions   Cephalosporins Itching    TOLERATED ZOSYN (PIPERACILLIN) BEFORE   Hydromorphone Itching   Keflin [Cephalothin] Itching   Lactose Intolerance (Gi) Diarrhea     Home Medications  Prior to Admission medications   Medication Sig Start Date End Date Taking? Authorizing Provider  albuterol (VENTOLIN HFA) 108 (90 Base) MCG/ACT inhaler Inhale 2 puffs into the lungs every 6 (six) hours as needed for wheezing or shortness of breath. 05/23/21   Pokhrel, Corrie Mckusick, MD  aspirin 81 MG EC tablet Take 1 tablet (81 mg total) by mouth daily. Swallow whole. 05/23/21   Pokhrel, Corrie Mckusick, MD  atorvastatin (LIPITOR) 40 MG tablet Take 1 tablet (40 mg total) by mouth Nightly. Patient taking differently: Take 40 mg by mouth at bedtime. 03/14/21 07/26/21  Fritzi Mandes, MD  camphor-menthol [facility]) lotion Apply topically as needed for itching. Patient taking differently: Apply 1 application  topically as needed for itching. 05/09/21   Hosie Poisson, MD  diclofenac Sodium (VOLTAREN) 1 % GEL Apply 2 g topically 4 (four) times daily as needed. 01/09/21   [provider]  insulin lispro (HUMALOG KWIKPEN) 100 UNIT/ML KwikPen Inject 5 Units into the skin 3 (three) times daily with meals. 06/27/21 09/25/21  Enzo Bi, MD  Insulin Pen Needle 32G X 4 MM MISC 1 Units by Does not apply route 3 (three) times daily. 06/27/21 09/25/21  Enzo Bi, MD  LANTUS SOLOSTAR 100 UNIT/ML Solostar Pen 10 Units at bedtime.    [provider]  melatonin 3  MG TABS tablet Take 2 tablets (6 mg total) by mouth at bedtime as needed. Home med. 06/27/21   Enzo Bi, MD  mirtazapine (REMERON) 15 MG tablet Take 15 mg by mouth at bedtime.    [provider]  OLANZapine (ZYPREXA) 7.5 MG tablet Take 1 tablet (7.5 mg total) by mouth at bedtime. 06/27/21 07/27/21  Enzo Bi, MD  Pancrelipase, Lip-Prot-Amyl, 24000-76000 units CPEP Take 3 capsules (72,000 Units total) by mouth 3 (three) times daily. Patient not taking: Reported  on 07/26/2021 12/27/20   Loletha Grayer, MD  polyethylene glycol (MIRALAX / GLYCOLAX) 17 g packet Place 17 g into feeding tube daily as needed for mild constipation. Patient not taking: Reported on 07/26/2021 05/09/21   Hosie Poisson, MD  rOPINIRole (REQUIP) 0.5 MG tablet Take 0.5 mg by mouth at bedtime. 06/29/21   [provider]     Critical care time: 60 minutes      Donell Beers, [place] Pager [phone] (please enter 7 digits) PCCM Consult Pager [phone] (please enter 7 digits)

## 2021-08-13 NOTE — ED Provider Notes (Signed)
[facility] Provider Note    Event Date/Time   First MD Initiated Contact with Patient 08/13/21 1134     (approximate)   History   Code Sepsis   HPI  Brittany [name] is a 61 y.o. female patient with history of diabetes and recent pneumonia.  She comes in alert and disoriented and moaning.  She is cold to the touch.  She seems septic.  No obvious source.  Blood pressure initially 74/64 with a heart rate of 105 temperature of 82.  Reportedly patient was found on the floor called by her daughter.      Physical Exam   Triage Vital Signs: ED Triage Vitals  Enc Vitals Group     BP 08/13/21 1139 (!) 74/64     Pulse Rate 08/13/21 1137 (!) 102     Resp 08/13/21 1137 15     Temp 08/13/21 1142 (!) 82.2 F (27.9 C)     Temp src --      SpO2 --      Weight 08/13/21 1138 92 lb 9.5 oz (42 kg)     Height --      Head Circumference --      Peak Flow --      Pain Score --      Pain Loc --      Pain Edu? --      Excl. in [place]? --     Most recent vital signs: Vitals:   08/13/21 1430 08/13/21 1440  BP: (!) 80/62   Pulse: 71 66  Resp:  12  Temp: (!) 88.6 F (31.4 C) (!) 88.9 F (31.6 C)  SpO2: 100% 100%     General: Awake moaning and asking for water Mouth is dry  CV:  Good peripheral perfusion.  Heart regular rate and rhythm no audible murmurs Resp:  Normal effort.  Lungs sound clear Abd:  No distention.  Belly soft and nontender there is an ostomy with yellow stool present Back shows some tunneling near the rectum but no discharge no obvious abscesses rectal pouch is about 2 inches deep Extremities with no edema no obvious lesions   ED Results / Procedures / Treatments   Labs (all labs ordered are listed, but only abnormal results are displayed) Labs Reviewed  LACTIC ACID, PLASMA - Abnormal; Notable for the following components:      Result Value   Lactic Acid, Venous 3.7 (*)    All other components within normal limits  COMPREHENSIVE  METABOLIC PANEL - Abnormal; Notable for the following components:   Sodium 131 (*)    CO2 <7 (*)    Glucose, Bld 592 (*)    BUN 28 (*)    Creatinine, Ser 1.38 (*)    Albumin 2.6 (*)    Total Bilirubin 1.6 (*)    GFR, Estimated 44 (*)    All other components within normal limits  CBC WITH DIFFERENTIAL/PLATELET - Abnormal; Notable for the following components:   WBC 13.0 (*)    Hemoglobin 11.0 (*)    MCHC 29.3 (*)    RDW 17.2 (*)    Neutro Abs 11.3 (*)    All other components within normal limits  PROTIME-INR - Abnormal; Notable for the following components:   Prothrombin Time 15.6 (*)    INR 1.3 (*)    All other components within normal limits  URINALYSIS, COMPLETE (UACMP) WITH MICROSCOPIC - Abnormal; Notable for the following components:   Color, Urine YELLOW (*)  APPearance HAZY (*)    Glucose, UA >=500 (*)    Hgb urine dipstick MODERATE (*)    Ketones, ur 80 (*)    Protein, ur 100 (*)    Bacteria, UA RARE (*)    All other components within normal limits  CK - Abnormal; Notable for the following components:   Total CK 680 (*)    All other components within normal limits  CBG MONITORING, ED - Abnormal; Notable for the following components:   Glucose-Capillary 562 (*)    All other components within normal limits  RESP PANEL BY RT-PCR (FLU A&B, COVID) ARPGX2  C DIFFICILE QUICK SCREEN W PCR REFLEX    CULTURE, BLOOD (ROUTINE X 2)  CULTURE, BLOOD (ROUTINE X 2)  URINE CULTURE  GASTROINTESTINAL PANEL BY PCR, STOOL (REPLACES STOOL CULTURE)  CULTURE, BLOOD (ROUTINE X 2)  CULTURE, BLOOD (ROUTINE X 2)  URINE CULTURE  APTT  TSH  LACTIC ACID, PLASMA  CBC  COMPREHENSIVE METABOLIC PANEL  URINALYSIS, ROUTINE W REFLEX MICROSCOPIC  BLOOD GAS, ARTERIAL  URINE DRUG SCREEN, QUALITATIVE ([facility] ONLY)  ETHANOL  CBG MONITORING, ED  TROPONIN I (HIGH SENSITIVITY)     EKG EKG read interpreted by me shows an irregularly irregular rhythm consistent with A-fib at a rate of 93 normal  axis no sign of MI.  There is T wave inversion laterally.  This is new compared to an EKG from 2 weeks ago.    RADIOLOGY  Chest x-ray and CTs are pending at this time  PROCEDURES:  Critical Care performed: Critical care 2 hours this includes most of the time spent at the bedside putting in or attempting to put in the central line.  I also help set up the bear hugger and supervised the insertion of the EJ.  Procedures oral consent obtained patient seemed to understand I am saying I prepped and draped the right groin in usual sterile fashion anesthetized the skin with lidocaine provided in the central line kit inserted the needle under ultrasound guidance and obtain flashback.  Unfortunately the wire would not come out of the wire holder and would not move either forward or back and by the time I am jammed it and got it into the back of the blue syringe it then jammed in the blue syringe and after on jamming that it would no longer thread.  Everything was removed I obtained flashback a second time after the needle was withdrawn and repositioned under ultrasound guidance but could not get the wire to thread in to the vein this time it went through the blue syringe at least.  I then reposition the equipment over to the patient's left side cleaned prepped and draped the other side the left side that is then anesthetized the skin and repeated the procedure obtaining flashback after couple tries the wire then threaded and I inserted the dilator and the dilator went in about an inch and a half and would not go any further under some more pressure it went a little bit further and then stopped when I withdrew the dilator the wire came out kinked with the dilator.  Dr. Leory Plowman was in the room helping me with this last when he then tried to do the right side again and obtain the same thing the dilator would not thread and then the wire kinked.  In the meantime we have given IV fluids and antibiotics through her IJ  line and peripheral line. ----------------------------------------- 1:24 PM on 08/13/2021 ----------------------------------------- Patient's blood pressure  is slightly higher at 83/60 her temperature is slightly higher also at 85.6  MEDICATIONS ORDERED IN ED: Medications  lactated ringers infusion (has no administration in time range)  vancomycin (VANCOCIN) IVPB 1000 mg/200 mL premix (1,000 mg Intravenous New Bag/Given 08/13/21 1450)  acetaminophen (TYLENOL) tablet 650 mg (has no administration in time range)  docusate sodium (COLACE) capsule 100 mg (has no administration in time range)  polyethylene glycol (MIRALAX / GLYCOLAX) packet 17 g (has no administration in time range)  heparin injection 5,000 Units (has no administration in time range)  ondansetron (ZOFRAN) injection 4 mg (has no administration in time range)  famotidine (PEPCID) tablet 20 mg (has no administration in time range)  0.9 %  sodium chloride infusion (has no administration in time range)  norepinephrine (LEVOPHED) 53m in 2569m(0.016 mg/mL) premix infusion (has no administration in time range)  piperacillin-tazobactam (ZOSYN) IVPB 3.375 g (has no administration in time range)  vancomycin (VANCOREADY) IVPB 750 mg/150 mL (has no administration in time range)  lactated ringers bolus 1,000 mL (0 mLs Intravenous Stopped 08/13/21 1350)    And  lactated ringers bolus 1,000 mL (0 mLs Intravenous Stopped 08/13/21 1350)    And  lactated ringers bolus 500 mL (0 mLs Intravenous Stopped 08/13/21 1450)  metroNIDAZOLE (FLAGYL) IVPB 500 mg (0 mg Intravenous Stopped 08/13/21 1421)  ceFEPIme (MAXIPIME) 2 g in sodium chloride 0.9 % 100 mL IVPB (0 g Intravenous Stopped 08/13/21 1310)     IMPRESSION / MDM / ASSESSMENT AND PLAN / ED COURSE  I reviewed the triage vital signs and the nursing notes. Patient is septic unknown source at this time.  I discussed her at length with the ICU doctor.  Dr. BrMerrilee Janskywho said that he would just give her fluids  now and if need be start her on pressors in the ICU and he would put a central line in there if need be.  I did offer to put a IJ in for him here.  Patient came in hypothermic with a temperature of 82 hypotensive and apparently septic.  Her sugar was 563.  All this is improved somewhat here with fluids and warming.  Antibiotics have been given as well.  Patient's presentation is most consistent with sepsis for an unknown reason.  The patient is on the cardiac monitor to evaluate for evidence of arrhythmia and/or significant heart rate changes.  None have been seen to this point      FINAL CLINICAL IMPRESSION(S) / ED DIAGNOSES   Final diagnoses:  Hypotension, unspecified hypotension type  Hypothermia, initial encounter  Hyperglycemia  Sepsis, due to unspecified organism, unspecified whether acute organ dysfunction present ([facility]    Rx / DC Orders   ED Discharge Orders     None        Note:  This document was prepared using Dragon voice recognition software and may include unintentional dictation errors.   MaNena PolioMD 08/13/21 1459

## 2021-08-13 NOTE — ED Triage Notes (Signed)
PT arrives today as a code sepsis recently treated for PNA and DKA. Pt arrives alert but disoriented at this time. EMS was unable to obtain temp but states she is cold to the touch.

## 2021-08-13 NOTE — ED Notes (Signed)
Phlebotomy at bedside at this time.

## 2021-08-13 NOTE — ED Notes (Signed)
Pt ECC was charted in error. Pt A/Ox2 at this time. Skin pink, cool and dry.

## 2021-08-13 NOTE — Progress Notes (Signed)
An USGPIV (ultrasound guided PIV) has been placed for short-term vasopressor infusion. A correctly placed ivWatch must be used when administering Vasopressors. Should this treatment be needed beyond 72 hours, central line access should be obtained.  It will be the responsibility of the bedside nurse to follow best practice to prevent extravasations.   ?

## 2021-08-13 NOTE — Progress Notes (Signed)
Received patient at 1630 from ED, patient is on an insulin drip, placed on Bare warmer, alert, disoriented x 4.  Patient had hand mitts placed due to pulling at IV lines, bed alarm is on.

## 2021-08-13 NOTE — Progress Notes (Signed)
DSS came to see the patient this evening and stated the patient should not be discharged before seeing DSS worker Monday; contact person is Toula Moos [phone].

## 2021-08-13 NOTE — ED Notes (Signed)
Bair hugger and fluid warmer in use to bring pt temp to normal range.

## 2021-08-13 NOTE — ED Notes (Signed)
RN removed pt wet pants. Pt pants were wet from waistband to feet however, pt brief was dry. No smell of urine present on pt clothing.

## 2021-08-13 NOTE — Sepsis Progress Note (Addendum)
[place] following for code sepsis  1201- per bedside RN note unable to get blood for labs, provider to do central line, MD unable to place central line

## 2021-08-13 NOTE — Consult Note (Signed)
PHARMACY -  BRIEF ANTIBIOTIC NOTE   Pharmacy has received consult(s) for Vancomycin & Aztreonam (swap for cefepime after allergy assessment) from an ED provider.  The patient's profile has been reviewed for ht/wt/allergies/indication/available labs.    One time order(s) placed for: Vancomycin 1g IV x1 in ED Cefepime 2g IV x1 in ED (pt has tolerated CFP, CRO, Augmentin, Unasyn, & Zosyn  in the past, so Aztreonam was d/c'd per consult) Pt also receiving Metronidazole '500mg'$  IV x1 in ED  Further antibiotics/pharmacy consults should be ordered by admitting physician if indicated.                       Thank you, Lorna Dibble 08/13/2021  11:41 AM

## 2021-08-13 NOTE — ED Notes (Signed)
RN received call from [place] with DSS, APS.

## 2021-08-13 NOTE — ED Notes (Signed)
RN talked to pt mother, RN is not able to get a hold of pt Daughter, Brittany [name]. Per pauline, she does not know why pt is there. RN advised that pt is very ill and is going to the ICU. Vira Agar says " Sounds like pt daughter needs help taking care of her mother, she has a one year old and a 62 year old at the house also as well as a full time job. I don't understand why she is not getting help for her mother when her mother has Medicaid." RN advised pt mother that she will be notified when pt goes to the ICU.

## 2021-08-13 NOTE — ED Notes (Addendum)
RN notified [facility] dispatch to place a DSS call with APS.

## 2021-08-13 NOTE — Consult Note (Signed)
PHARMACY CONSULT NOTE - FOLLOW UP  Pharmacy Consult for Electrolyte Monitoring and Replacement   Recent Labs: Potassium (mmol/L)  Date Value  08/13/2021 4.8  08/17/2011 3.6   Magnesium (mg/dL)  Date Value  07/28/2021 1.7   Calcium (mg/dL)  Date Value  08/13/2021 8.9   Calcium, Total (mg/dL)  Date Value  08/17/2011 9.7   Albumin (g/dL)  Date Value  08/13/2021 2.6 (L)  04/04/2011 4.5   Phosphorus (mg/dL)  Date Value  07/28/2021 1.8 (L)   Sodium (mmol/L)  Date Value  08/13/2021 131 (L)  08/17/2011 139     Assessment: Brittany [name] is a 61 y.o. female w/ PMH of DM, HLD, HTN, RLS, Pancreatic Cancer s/p Distal pancreatectomy and splenectomy, recent PNA, CVA, dysphagia admitted on 08/13/2021 with sepsis & DKA. Started on insulin gtt Pharmacy has been consulted for electrolyte mgmt.  Goal of Therapy:  Maintain Lytes WNL  Plan:  Currently Lytes are WNL & Scr 1.38 (BL 0.4-0.6) Corrected sodium is 139 (gluc 592) K 4.8: receiving KCL IV 79mq q1h x2 doses per provider (on insulin gtt) BMP is ordered q4h x5 (1945 / 2345 / 02536/ 06440/ 1145)  BLorna Dibble,PharmD Clinical Pharmacist 08/13/2021 3:57 PM

## 2021-08-13 NOTE — ED Notes (Signed)
Provider continued attempting to get CVC line placed for blood drawl. ABX started with no labs or blood cultures.

## 2021-08-13 NOTE — Consult Note (Signed)
CODE SEPSIS - PHARMACY COMMUNICATION  **Broad Spectrum Antibiotics should be administered within 1 hour of Sepsis diagnosis**  Time Code Sepsis Called/Page Received: 1140  Antibiotics Ordered: 1145  Time of 1st antibiotic administration: 1240  Additional action taken by pharmacy: Messaged RN to notify med was tubed while pyxis was offline to help triage order.  Lorna Dibble ,PharmD Clinical Pharmacist  08/13/2021  11:40 AM

## 2021-08-14 DIAGNOSIS — R739 Hyperglycemia, unspecified: Secondary | ICD-10-CM | POA: Diagnosis not present

## 2021-08-14 DIAGNOSIS — A419 Sepsis, unspecified organism: Principal | ICD-10-CM

## 2021-08-14 LAB — BASIC METABOLIC PANEL
Anion gap: 11 (ref 5–15)
Anion gap: 8 (ref 5–15)
BUN: 19 mg/dL (ref 8–23)
BUN: 18 mg/dL (ref 8–23)
CO2: 23 mmol/L (ref 22–32)
CO2: 26 mmol/L (ref 22–32)
Calcium: 8.6 mg/dL — ABNORMAL LOW (ref 8.9–10.3)
Calcium: 8.6 mg/dL — ABNORMAL LOW (ref 8.9–10.3)
Chloride: 104 mmol/L (ref 98–111)
Chloride: 106 mmol/L (ref 98–111)
Creatinine, Ser: 0.92 mg/dL (ref 0.44–1.00)
Creatinine, Ser: 0.83 mg/dL (ref 0.44–1.00)
GFR, Estimated: >60 mL/min (ref >60)
GFR, Estimated: >60 mL/min (ref >60)
Glucose, Bld: 184 mg/dL — ABNORMAL HIGH (ref 70–99)
Glucose, Bld: 140 mg/dL — ABNORMAL HIGH (ref 70–99)
Potassium: 4 mmol/L (ref 3.5–5.1)
Potassium: 3.7 mmol/L (ref 3.5–5.1)
Sodium: 138 mmol/L (ref 135–145)
Sodium: 140 mmol/L (ref 135–145)

## 2021-08-14 LAB — GLUCOSE, CAPILLARY
Glucose-Capillary: 186 mg/dL — ABNORMAL HIGH (ref 70–99)
Glucose-Capillary: 180 mg/dL — ABNORMAL HIGH (ref 70–99)
Glucose-Capillary: 172 mg/dL — ABNORMAL HIGH (ref 70–99)
Glucose-Capillary: 178 mg/dL — ABNORMAL HIGH (ref 70–99)
Glucose-Capillary: 135 mg/dL — ABNORMAL HIGH (ref 70–99)
Glucose-Capillary: 131 mg/dL — ABNORMAL HIGH (ref 70–99)
Glucose-Capillary: 110 mg/dL — ABNORMAL HIGH (ref 70–99)
Glucose-Capillary: 158 mg/dL — ABNORMAL HIGH (ref 70–99)
Glucose-Capillary: 185 mg/dL — ABNORMAL HIGH (ref 70–99)
Glucose-Capillary: 208 mg/dL — ABNORMAL HIGH (ref 70–99)
Glucose-Capillary: 122 mg/dL — ABNORMAL HIGH (ref 70–99)
Glucose-Capillary: 133 mg/dL — ABNORMAL HIGH (ref 70–99)

## 2021-08-14 LAB — CBC WITH DIFFERENTIAL/PLATELET
Abs Immature Granulocytes: 0.03 10*3/uL (ref 0.00–0.07)
Basophils Absolute: 0 10*3/uL (ref 0.0–0.1)
Basophils Relative: 0 %
Eosinophils Absolute: 0 10*3/uL (ref 0.0–0.5)
Eosinophils Relative: 0 %
HCT: 26.1 % — ABNORMAL LOW (ref 36.0–46.0)
Hemoglobin: 9.1 g/dL — ABNORMAL LOW (ref 12.0–15.0)
Immature Granulocytes: 0 %
Lymphocytes Relative: 17 %
Lymphs Abs: 1.5 10*3/uL (ref 0.7–4.0)
MCH: 28.1 pg (ref 26.0–34.0)
MCHC: 34.9 g/dL (ref 30.0–36.0)
MCV: 80.6 fL (ref 80.0–100.0)
Monocytes Absolute: 0.7 10*3/uL (ref 0.1–1.0)
Monocytes Relative: 8 %
Neutro Abs: 6.7 10*3/uL (ref 1.7–7.7)
Neutrophils Relative %: 75 %
Platelets: 315 10*3/uL (ref 150–400)
RBC: 3.24 MIL/uL — ABNORMAL LOW (ref 3.87–5.11)
RDW: 15.8 % — ABNORMAL HIGH (ref 11.5–15.5)
WBC: 8.9 10*3/uL (ref 4.0–10.5)
nRBC: 0.2 % (ref 0.0–0.2)

## 2021-08-14 LAB — LACTIC ACID, PLASMA
Lactic Acid, Venous: 3.9 mmol/L (ref 0.5–1.9)
Lactic Acid, Venous: 3.3 mmol/L (ref 0.5–1.9)
Lactic Acid, Venous: 1.8 mmol/L (ref 0.5–1.9)

## 2021-08-14 LAB — PHOSPHORUS: Phosphorus: 1.7 mg/dL — ABNORMAL LOW (ref 2.5–4.6)

## 2021-08-14 LAB — MAGNESIUM: Magnesium: 2.3 mg/dL (ref 1.7–2.4)

## 2021-08-14 LAB — URINE CULTURE: Culture: NO GROWTH

## 2021-08-14 MED ORDER — INSULIN ASPART 100 UNIT/ML IJ SOLN
0.0000 [IU] | INTRAMUSCULAR | Status: DC
  Administered 2021-08-14 (×2): 3 [IU] via SUBCUTANEOUS
  Administered 2021-08-14: 2 [IU] via SUBCUTANEOUS
  Administered 2021-08-14: 5 [IU] via SUBCUTANEOUS
  Administered 2021-08-15: 2 [IU] via SUBCUTANEOUS
  Administered 2021-08-15: 11 [IU] via SUBCUTANEOUS
  Administered 2021-08-15 – 2021-08-16 (×2): 3 [IU] via SUBCUTANEOUS
  Filled 2021-08-14 (×8): qty 1

## 2021-08-14 MED ORDER — CHLORHEXIDINE GLUCONATE CLOTH 2 % EX PADS
6.0000 | MEDICATED_PAD | Freq: Every day | CUTANEOUS | Status: DC
  Administered 2021-08-14 – 2021-08-24 (×11): 6 via TOPICAL

## 2021-08-14 MED ORDER — DEXTROSE 5 % IV SOLN
30.0000 mmol | Freq: Once | INTRAVENOUS | Status: AC
  Administered 2021-08-14: 30 mmol via INTRAVENOUS
  Filled 2021-08-14: qty 10

## 2021-08-14 MED ORDER — LACTATED RINGERS IV BOLUS
1000.0000 mL | Freq: Once | INTRAVENOUS | Status: AC
  Administered 2021-08-14: 1000 mL via INTRAVENOUS

## 2021-08-14 MED ORDER — ENOXAPARIN SODIUM 30 MG/0.3ML IJ SOSY
30.0000 mg | PREFILLED_SYRINGE | Freq: Every day | INTRAMUSCULAR | Status: DC
  Administered 2021-08-14 – 2021-08-27 (×14): 30 mg via SUBCUTANEOUS
  Filled 2021-08-14 (×14): qty 0.3

## 2021-08-14 MED ORDER — SODIUM CHLORIDE 0.9 % IV SOLN
3.0000 g | Freq: Four times a day (QID) | INTRAVENOUS | Status: DC
  Administered 2021-08-15 – 2021-08-17 (×8): 3 g via INTRAVENOUS
  Filled 2021-08-14 (×6): qty 8
  Filled 2021-08-14 (×2): qty 3
  Filled 2021-08-14 (×2): qty 8
  Filled 2021-08-14: qty 3
  Filled 2021-08-14: qty 8
  Filled 2021-08-14: qty 3

## 2021-08-14 MED ORDER — INSULIN GLARGINE-YFGN 100 UNIT/ML ~~LOC~~ SOLN
13.0000 [IU] | SUBCUTANEOUS | Status: DC
  Administered 2021-08-14 – 2021-08-16 (×3): 13 [IU] via SUBCUTANEOUS
  Filled 2021-08-14 (×3): qty 0.13

## 2021-08-14 NOTE — Progress Notes (Signed)
SYNOPSIS 61 yo female who presented to [facility] ER via EMS on 07/2 with altered mental status.  Pts daughter reported to EMS the pt was "fine" at 08:30 am however she later found her on the floor prompting EMS notification.  Per EMS run sheet upon their arrival at pts home she was found pale, cold, and clammy.  Her CBG readings were 442 and EMS was unable to obtain a temperature.  Pt placed on 6L nasal canula.  En route to the ER code sepsis initiated and pt received 500 ml iv fluid bolus. Pt remained altered and placed on 15L NRB.  She was recently hospitalized between 06/14 to 06/16 following treatment of CAP and DKA.     ED Course In the ER pt alert but disoriented.  ED vital signs were: bp 74/64, hr 105, resp rate 13, pt hypothermic, and O2 sats were 100%.  Sepsis protocol initiated: pt received cefepime, flagyl, vancomycin, 2.5 LR fluid bolus.  Labs were obtained.   CMP: Na+ 131, CO2 <7, glucose 592, BUN 28, creatinine 1.38, anion gap not calculated, albumin 2.6, total bilirubin 1.6, lactic acid 3.7, and pct 0.11 CBC: WBC 13.0 and Hgb 11.0 CXR: Increased right lower lobe infiltrate, suspicious for pneumonia    Pt ruled in for DKA and insulin gtt initiated and prn levophed ordered for hypotension.  PCCM team contacted for ICU admission.    CHIEF COMPLAINT:   Admitted for sepsis, encephalopathy   HPI Pt confused and unable to follow commands.   She is currently not requiring levophed gtt map >65.  O2 sats '@100'$ % with no signs of respiratory distress NOT on pressors NOT on oxygen Insulin drip being tirated       Significant Hospital Events: Including procedures, antibiotic start and stop dates in addition to other pertinent events   07/2: Pt admitted to the stepdown unit with acute metabolic encephalopathy, severe sepsis with septic shock secondary to pneumonia likely aspiration, hypothermia, and DKA      BP 104/74   Pulse 69   Temp (!) 95.7 F (35.4 C)   Resp 18   Wt 44.1 kg    SpO2 100%   BMI 17.22 kg/m      Review of Systems: LIMITED DUE TO CONFUSION Other:  All other systems negative   Physical Examination:   General Appearance: No distress  EYES PERRLA, EOM intact.   NECK Supple, No JVD Pulmonary: normal breath sounds, No wheezing.  CardiovascularNormal S1,S2.  No m/r/g.   Neuro:lethargic   I personally reviewed Labs under Results section.  Radiology Reports CT HEAD WO CONTRAST (5MM)  Result Date: 08/13/2021 CLINICAL DATA:  61 y.o. female patient with history of diabetes and recent pneumonia. She comes in alert and disoriented and moaning. She is cold to the touch. She seems septic. No obvious source. Blood pressure initially 74/64 with a heart rate of 105 temperature of 82. Reportedly patient was found on the floor called by her daughter EXAM: CT HEAD WITHOUT CONTRAST TECHNIQUE: Contiguous axial images were obtained from the base of the skull through the vertex without intravenous contrast. RADIATION DOSE REDUCTION: This exam was performed according to the departmental dose-optimization program which includes automated exposure control, adjustment of the mA and/or kV according to patient size and/or use of iterative reconstruction technique. COMPARISON:  07/26/2021 and 06/26/2018 FINDINGS: Brain: No evidence of acute infarction, hemorrhage, hydrocephalus, extra-axial collection or mass lesion/mass effect. Old infarcts, left temporal occipital lobe, left cerebellum and small infarct in right frontal lobe,  stable. Patchy white matter hypoattenuation noted more diffusely consistent with moderate chronic microvascular ischemic change. Vascular: No hyperdense vessel or unexpected calcification. Skull: Normal. Negative for fracture or focal lesion. Sinuses/Orbits: Globes and orbits are unremarkable. Visualized sinuses are clear. Other: None. IMPRESSION: 1. No acute intracranial abnormalities. 2. Old infarcts and chronic microvascular ischemic change. Stable  appearance from the prior study. Electronically Signed   By: Lajean Manes M.D.   On: 08/13/2021 16:19   DG Chest Port 1 View  Result Date: 08/13/2021 CLINICAL DATA:  Pneumonia.  Sepsis. EXAM: PORTABLE CHEST 1 VIEW COMPARISON:  07/26/2021 FINDINGS: The heart size and mediastinal contours are within normal limits. Pulmonary hyperinflation is again noted. Increased infiltrate is seen in the right lower lobe. No evidence of pleural effusion. IMPRESSION: Increased right lower lobe infiltrate, suspicious for pneumonia. Electronically Signed   By: Marlaine Hind M.D.   On: 08/13/2021 14:27       Assessment/Plan:   61 yo AAF with severe sepsis due to aspiration pneumonia with severe acidosis and metabolic encephalopathy with renal failure and DKA  SEVERE SEPSIS SOURCE-pnyuemonia -use vasopressors to keep MAP>65 as needed NOT needed at the moment -follow ABG and LA as needed -follow up cultures -emperic ABX -consider stress dose steroids -aggressive IV fluid Resuscitation  DKA resolving  ACUTE KIDNEY INJURY/Renal Failure-RESOLVING -continue Foley Catheter-assess need -Avoid nephrotoxic agents -Follow urine output, BMP -Ensure adequate renal perfusion, optimize oxygenation -Renal dose medications   Intake/Output Summary (Last 24 hours) at 08/14/2021 0740 Last data filed at 08/14/2021 0700 Gross per 24 hour  Intake 7987.07 ml  Output 1875 ml  Net 6112.07 ml      Latest Ref Rng & Units 08/14/2021    4:27 AM 08/14/2021    1:52 AM 08/13/2021    8:40 PM  BMP  Glucose 70 - 99 mg/dL 140  184  225   BUN 8 - 23 mg/dL '18  19  25   '$ Creatinine 0.44 - 1.00 mg/dL 0.83  0.92  1.20   Sodium 135 - 145 mmol/L 140  138  140   Potassium 3.5 - 5.1 mmol/L 3.7  4.0  3.3   Chloride 98 - 111 mmol/L 106  104  105   CO2 22 - 32 mmol/L '26  23  19   '$ Calcium 8.9 - 10.3 mg/dL 8.6  8.6  8.5     ENDO - ICU hypoglycemic\Hyperglycemia protocol -check FSBS per protocol DKA improving-weaning off inulin  drip   GI GI PROPHYLAXIS as indicated  NUTRITIONAL STATUS DIET-->NPO Constipation protocol as indicated   ELECTROLYTES -follow labs as needed -replace as needed -pharmacy consultation and following   NEUROLOGY ACUTE  METABOLIC ENCEPHALOPATHY slow to improve      Best practice     Diet: NPO  Foley:  Yes, and it is still needed Mobility:  bed rest  Code Status:  FULL Disposition:ICU    DVT/GI PRX ordered and assessed TRANSFUSIONS AS NEEDED MONITOR FSBS I Assessed the need for Labs I Assessed the need for Foley I Assessed the need for Central Venous Line Family Discussion when available I Assessed the need for Mobilization I made an Assessment of medications to be adjusted accordingly Safety Risk assessment completed  CASE DISCUSSED IN MULTIDISCIPLINARY ROUNDS WITH ICU TEAM      Critical Care Time devoted to patient care services described in this note is 40 minutes.  Critical care was necessary to treat /prevent imminent and life-threatening deterioration.   Corrin Parker, M.D.  [facility]  Medical Director [place] Director [facility] Cardio-Pulmonary Department

## 2021-08-14 NOTE — Progress Notes (Signed)
Initial Nutrition Assessment  DOCUMENTATION CODES:   Severe malnutrition in context of chronic illness  INTERVENTION:   RD will add supplements once pt's diet is advanced   Pt at high refeed risk; recommend monitor potassium, magnesium and phosphorus labs daily until stable  NUTRITION DIAGNOSIS:   Severe Malnutrition related to chronic illness (COPD, pancreatic cancer) as evidenced by severe fat depletion, severe muscle depletion.  GOAL:   Patient will meet greater than or equal to 90% of their needs  MONITOR:   Diet advancement, Labs, Weight trends, Skin, I & O's  REASON FOR ASSESSMENT:   Rounds    ASSESSMENT:   61 y.o. 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  Visited pt's room today. Pt is well known to this RD from previous admissions. Pt is unable to provide any nutrition related history today. Pt opens her eyes briefly during her exam and keeps repeating "do you know where I am". Pt with poor appetite and oral intake at baseline. Pt does not drink "milky" supplements as she is lactose intolerance. Per chart, pt appears weight stable pta. Pt currently NPO. RD will add supplements and MVI with diet advancement. Pt is refeeding; electrolytes being replaced.   Medications reviewed and include: lovenox, insulin, unasyn, LRS '@50ml'$ /hr, zosyn  Labs reviewed: K 3.7 wnl, P 1.7(L), Mg 2.3 wnl Hgb 9.1(L), Hct 26.1(L) Cbgs- 185, 158, 110, 131, 135 x 24 hrs AIC  15.0(H)  NUTRITION - FOCUSED PHYSICAL EXAM:  Flowsheet Row Most Recent Value  Orbital Region Moderate depletion  Upper Arm Region Severe depletion  Thoracic and Lumbar Region Severe depletion  Buccal Region Moderate depletion  Temple Region Severe depletion  Clavicle Bone Region Severe depletion  Clavicle and Acromion Bone Region Severe depletion  Scapular Bone Region Severe depletion  Dorsal Hand Severe depletion  Patellar Region Severe depletion  Anterior Thigh Region Severe depletion  Posterior Calf Region Severe depletion  Edema (RD Assessment) None  Hair Reviewed  Eyes Reviewed  Mouth Reviewed  Skin Reviewed  Nails Reviewed   Diet Order:   Diet Order             Diet NPO time specified  Diet effective now                  EDUCATION NEEDS:   Education needs have been addressed  Skin:  Skin Assessment: Reviewed RN Assessment  Last BM:  7/3- 364m via ostomy  Height:   Ht Readings from Last 1 Encounters:  07/26/21 '5\' 3"'$  (1.6 m)    Weight:   Wt Readings from Last 1 Encounters:  08/13/21 44.1 kg    Ideal Body Weight:  52.3 kg  BMI:  Body mass index is 17.22 kg/m.  Estimated Nutritional Needs:   Kcal:  1400-1600kcal/day  Protein:  70-80g/day  Fluid:  1.3-1.5L/day  CKoleen DistanceMS, RD, LDN Please refer to [facility] Centerfor RD and/or RD on-call/weekend/after hours pager
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
## 2021-08-14 NOTE — Consult Note (Signed)
Brittany [name] for Electrolyte Monitoring and Replacement   Recent Labs: Potassium (mmol/L)  Date Value  08/14/2021 3.7  08/17/2011 3.6   Magnesium (mg/dL)  Date Value  08/14/2021 2.3   Calcium (mg/dL)  Date Value  08/14/2021 8.6 (L)   Calcium, Total (mg/dL)  Date Value  08/17/2011 9.7   Albumin (g/dL)  Date Value  08/13/2021 2.3 (L)  04/04/2011 4.5   Phosphorus (mg/dL)  Date Value  08/14/2021 1.7 (L)   Sodium (mmol/L)  Date Value  08/14/2021 140  08/17/2011 139   Assessment: Brittany [name] is a 61 y.o. female w/ PMH of DM, HLD, HTN, RLS, Pancreatic Cancer s/p Distal pancreatectomy and splenectomy, recent PNA, CVA, dysphagia admitted on 08/13/2021 with sepsis & DKA. Started on insulin gtt. Pharmacy has been consulted for electrolyte mgmt.  Patient has been transitioned off of insulin gtt and onto [place] insulin regimen  MIVF: LR at 50 cc/hr  Goal of Therapy:  Electrolytes within normal limits  Plan:  --Phos 1.7, potassium phosphate 30 mmol IV x 1 per PCCM --Follow-up electrolytes with AM labs tomorrow  Brittany [name] 08/14/2021 1:47 PM

## 2021-08-14 NOTE — Progress Notes (Signed)
Pt is more awake, alert and oriented to self and place but not time and date. Pt passed nursing bedside swallow evaluation. Pt was assisted to chair and seat for 20 minutes, generalized  weakness, grips are weak but equal.  Oral temp improved compare to  foley temp. Bear hugger on now.  Pt spoke to her mother on phone this evening. Speech  is clear but delay respond time. Spoke with MD above improvement, plan is to feed pt, dc foley, PT/OT consults, DC foley and transfer pt to the floor.

## 2021-08-14 NOTE — Inpatient Diabetes Management (Signed)
Inpatient Diabetes Program Recommendations  AACE/ADA: New Consensus Statement on Inpatient Glycemic Control (2015)  Target Ranges:  Prepandial:   less than 140 mg/dL      Peak postprandial:   less than 180 mg/dL (1-2 hours)      Critically ill patients:  140 - 180 mg/dL    Latest Reference Range & Units 08/13/21 23:01 08/13/21 23:54 08/14/21 01:17 08/14/21 01:54 08/14/21 03:14 08/14/21 04:00 08/14/21 04:57 08/14/21 06:01 08/14/21 06:50  Glucose-Capillary 70 - 99 mg/dL 218 (H)  IV Insulin Drip Infusing 189 (H) 186 (H) 180 (H) 172 (H) 178 (H) 135 (H)  13 units [place] '@0454'$  131 (H) 110 (H)  IV Insulin Drip Stopped  (H): Data is abnormally high  Admit with: Acute metabolic encephalopathy, severe sepsis with septic shock secondary to pneumonia likely aspiration, hypothermia, and DKA   Recently hospitalized between 06/14 to 06/16 following treatment of CAP and DKA.    History: Diabetes, CVA, Pancreactomy/splenectomy  Home DM Meds: Humalog 5 units TID with meals                             Lantus 10 units QHS  Current Orders: Novolog Moderate Correction Scale/ SSI (0-15 units) Q4 hours     Semglee 13 units Q24H   MD- Note pt transitioned to SQ Insulin this AM  Patient is well known to the Inpatient Diabetes Team This is the [facility]th admission to [facility] since January 2023 Per MD notes from last admit, daughter administers patient's insulin, however patient eats what ever she wants and is not compliant with a diabetic diet--Unsure if pt getting proper supervision and care at home??    --Will follow patient during hospitalization--  Wyn Quaker RN, MSN, CDE Diabetes Coordinator Inpatient Glycemic Control Team Team Pager: [phone] (8a-5p)

## 2021-08-14 NOTE — Plan of Care (Signed)
Pt progressing well all day. VSS.

## 2021-08-15 ENCOUNTER — Encounter: Payer: Self-pay | Admitting: Internal Medicine

## 2021-08-15 DIAGNOSIS — I10 Essential (primary) hypertension: Secondary | ICD-10-CM

## 2021-08-15 DIAGNOSIS — Z8673 Personal history of transient ischemic attack (TIA), and cerebral infarction without residual deficits: Secondary | ICD-10-CM | POA: Diagnosis not present

## 2021-08-15 DIAGNOSIS — F418 Other specified anxiety disorders: Secondary | ICD-10-CM | POA: Diagnosis not present

## 2021-08-15 DIAGNOSIS — E1110 Type 2 diabetes mellitus with ketoacidosis without coma: Secondary | ICD-10-CM

## 2021-08-15 DIAGNOSIS — E782 Mixed hyperlipidemia: Secondary | ICD-10-CM

## 2021-08-15 LAB — LEGIONELLA PNEUMOPHILA SEROGP 1 UR AG: L. pneumophila Serogp 1 Ur Ag: NEGATIVE

## 2021-08-15 LAB — GLUCOSE, CAPILLARY
Glucose-Capillary: 98 mg/dL (ref 70–99)
Glucose-Capillary: 126 mg/dL — ABNORMAL HIGH (ref 70–99)
Glucose-Capillary: 174 mg/dL — ABNORMAL HIGH (ref 70–99)
Glucose-Capillary: 322 mg/dL — ABNORMAL HIGH (ref 70–99)
Glucose-Capillary: 97 mg/dL (ref 70–99)

## 2021-08-15 LAB — CBC WITH DIFFERENTIAL/PLATELET
Abs Immature Granulocytes: 0.04 10*3/uL (ref 0.00–0.07)
Basophils Absolute: 0 10*3/uL (ref 0.0–0.1)
Basophils Relative: 1 %
Eosinophils Absolute: 0 10*3/uL (ref 0.0–0.5)
Eosinophils Relative: 0 %
HCT: 26.9 % — ABNORMAL LOW (ref 36.0–46.0)
Hemoglobin: 9.2 g/dL — ABNORMAL LOW (ref 12.0–15.0)
Immature Granulocytes: 1 %
Lymphocytes Relative: 29 %
Lymphs Abs: 2.5 10*3/uL (ref 0.7–4.0)
MCH: 27.7 pg (ref 26.0–34.0)
MCHC: 34.2 g/dL (ref 30.0–36.0)
MCV: 81 fL (ref 80.0–100.0)
Monocytes Absolute: 0.4 10*3/uL (ref 0.1–1.0)
Monocytes Relative: 4 %
Neutro Abs: 5.7 10*3/uL (ref 1.7–7.7)
Neutrophils Relative %: 65 %
Platelets: 300 10*3/uL (ref 150–400)
RBC: 3.32 MIL/uL — ABNORMAL LOW (ref 3.87–5.11)
RDW: 16.6 % — ABNORMAL HIGH (ref 11.5–15.5)
WBC: 8.6 10*3/uL (ref 4.0–10.5)
nRBC: 0.8 % — ABNORMAL HIGH (ref 0.0–0.2)

## 2021-08-15 LAB — PHOSPHORUS: Phosphorus: 2.4 mg/dL — ABNORMAL LOW (ref 2.5–4.6)

## 2021-08-15 LAB — BASIC METABOLIC PANEL
Anion gap: 5 (ref 5–15)
BUN: 10 mg/dL (ref 8–23)
CO2: 27 mmol/L (ref 22–32)
Calcium: 8.2 mg/dL — ABNORMAL LOW (ref 8.9–10.3)
Chloride: 104 mmol/L (ref 98–111)
Creatinine, Ser: 0.7 mg/dL (ref 0.44–1.00)
GFR, Estimated: >60 mL/min (ref >60)
Glucose, Bld: 107 mg/dL — ABNORMAL HIGH (ref 70–99)
Potassium: 3.3 mmol/L — ABNORMAL LOW (ref 3.5–5.1)
Sodium: 136 mmol/L (ref 135–145)

## 2021-08-15 LAB — MAGNESIUM: Magnesium: 1.9 mg/dL (ref 1.7–2.4)

## 2021-08-15 MED ORDER — PANCRELIPASE (LIP-PROT-AMYL) 36000-114000 UNITS PO CPEP
36000.0000 [IU] | ORAL_CAPSULE | Freq: Three times a day (TID) | ORAL | Status: DC
  Administered 2021-08-16 – 2021-08-28 (×37): 36000 [IU] via ORAL
  Filled 2021-08-15 (×6): qty 1
  Filled 2021-08-15: qty 3
  Filled 2021-08-15 (×33): qty 1

## 2021-08-15 MED ORDER — ATORVASTATIN CALCIUM 20 MG PO TABS
40.0000 mg | ORAL_TABLET | Freq: Every day | ORAL | Status: DC
  Administered 2021-08-15 – 2021-08-27 (×13): 40 mg via ORAL
  Filled 2021-08-15 (×13): qty 2

## 2021-08-15 MED ORDER — MIRTAZAPINE 15 MG PO TABS
15.0000 mg | ORAL_TABLET | Freq: Every day | ORAL | Status: DC
  Administered 2021-08-15 – 2021-08-27 (×13): 15 mg via ORAL
  Filled 2021-08-15 (×13): qty 1

## 2021-08-15 MED ORDER — POTASSIUM CHLORIDE 10 MEQ/100ML IV SOLN
10.0000 meq | INTRAVENOUS | Status: AC
  Administered 2021-08-15 (×2): 10 meq via INTRAVENOUS
  Filled 2021-08-15 (×2): qty 100

## 2021-08-15 MED ORDER — OLANZAPINE 5 MG PO TABS
7.5000 mg | ORAL_TABLET | Freq: Every day | ORAL | Status: DC
  Administered 2021-08-15 – 2021-08-27 (×13): 7.5 mg via ORAL
  Filled 2021-08-15 (×7): qty 2
  Filled 2021-08-15: qty 1
  Filled 2021-08-15 (×6): qty 2

## 2021-08-15 MED ORDER — ADULT MULTIVITAMIN W/MINERALS CH
1.0000 | ORAL_TABLET | Freq: Every day | ORAL | Status: DC
  Administered 2021-08-16 – 2021-08-28 (×13): 1 via ORAL
  Filled 2021-08-15 (×13): qty 1

## 2021-08-15 MED ORDER — ROPINIROLE HCL 1 MG PO TABS
0.5000 mg | ORAL_TABLET | Freq: Every day | ORAL | Status: DC
  Administered 2021-08-15 – 2021-08-27 (×13): 0.5 mg via ORAL
  Filled 2021-08-15: qty 2
  Filled 2021-08-15 (×13): qty 1

## 2021-08-15 MED ORDER — GLUCERNA SHAKE PO LIQD
237.0000 mL | Freq: Three times a day (TID) | ORAL | Status: DC
  Administered 2021-08-15 – 2021-08-28 (×36): 237 mL via ORAL

## 2021-08-15 MED ORDER — POTASSIUM PHOSPHATES 15 MMOLE/5ML IV SOLN
15.0000 mmol | Freq: Once | INTRAVENOUS | Status: AC
  Administered 2021-08-15: 15 mmol via INTRAVENOUS
  Filled 2021-08-15: qty 5

## 2021-08-15 MED ORDER — ASPIRIN 81 MG PO TBEC
81.0000 mg | DELAYED_RELEASE_TABLET | Freq: Every day | ORAL | Status: DC
  Administered 2021-08-15 – 2021-08-28 (×14): 81 mg via ORAL
  Filled 2021-08-15 (×14): qty 1

## 2021-08-15 NOTE — Assessment & Plan Note (Signed)
Restart home Requip

## 2021-08-15 NOTE — Assessment & Plan Note (Signed)
Estimated body mass index is 17.22 kg/m as calculated from the following:   Height as of 07/26/21: '5\' 3"'$  (1.6 m).   Weight as of this encounter: 44.1 kg.   -Dietitian consult

## 2021-08-15 NOTE — Progress Notes (Signed)
Progress Note   [name] VZC:[id number] DOB: 1960/10/12 DOA: 08/13/2021     2 DOS: the patient was seen and examined on 08/15/2021   Brief hospital course: Taken from prior notes.  ICU transfer.  Ms. Maridee Slape, is a 61 year old female with history of hypertension, poorly controlled insulin-dependent diabetes mellitus, history of left PCA territory infarct, right hemiparesis, COPD, history of pancreectomy/splenectomy, history of diverticulitis with abscess leading to colostomy in January 2023, anxiety, depression, who presented to [facility] ER via EMS on 07/2 with altered mental status.  Pts daughter reported to EMS the pt was "fine" at 08:30 am today, however she later found her on the floor prompting EMS notification.  Per EMS run sheet upon their arrival at pts home she was found pale, cold, and clammy.  Her CBG readings were 442 and EMS was unable to obtain a temperature.  Pt placed on 6L nasal canula.  En route to the ER code sepsis initiated and pt received 500 ml iv fluid bolus. Pt remained altered and placed on 15L NRB.  She was recently hospitalized between 06/14 to 06/16 following treatment of CAP and DKA.     ED Course In the ER pt alert but disoriented.  ED vital signs were: bp 74/64, hr 105, resp rate 13, pt hypothermic, and O2 sats were 100%.  Sepsis protocol initiated: pt received cefepime, flagyl, vancomycin, 2.5 LR fluid bolus.  Labs were obtained.   CMP: Na+ 131, CO2 <7, glucose 592, BUN 28, creatinine 1.38, anion gap not calculated, albumin 2.6, total bilirubin 1.6, lactic acid 3.7, and pct 0.11 CBC: WBC 13.0 and Hgb 11.0 CXR: Increased right lower lobe infiltrate, suspicious for pneumonia .  Admitted with PCCM for concern of DKA and sepsis with septic shock secondary to pneumonia. Patient initially received broad-spectrum antibiotics with Zosyn, vancomycin and Flagyl under sepsis protocol and then transitioned to Unasyn for concern of aspiration pneumonia. Patient also  required pressors due to insufficient response to IV fluid.  7/4: Patient now hemodynamically stable.  Off the pressors.  Insulin infusion was switched with basal and short-acting.  Care is being transitioned to [facility].  Mild hypokalemia and hypophosphatemia which is being repleted.      Assessment and Plan: * DKA (diabetic ketoacidosis) ([place]) Patient admitted with DKA which has been resolved.  Insulin infusion has been switched with basal and short-acting. -Continue with Semglee 13 units daily -Continue with SSI  Severe sepsis [facility]) Patient presented with severe sepsis and septic shock secondary to pneumonia.  Initially required pressors due to insufficient response to IV fluid.  Now off the pressors and blood pressure mildly elevated. Received broad-spectrum antibiotics and IV fluid per sepsis protocol. Antibiotics were switched with Unasyn for concern of aspiration pneumonia. -Continue with Unasyn -Continue with supportive care  Essential hypertension Patient was not on any antihypertensive medications at home. Blood pressure mildly elevated. 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  Being managed by oncology as an outpatient. -Continue with Creon  HLD (hyperlipidemia) - Continue with home Lipitor  Depression with anxiety - Restart home Zyprexa and mirtazapine  Protein-calorie malnutrition, severe Estimated body mass index is 17.22 kg/m as calculated from the following:   Height as of 07/26/21: '5\' 3"'$  (1.6 m).   Weight as of this encounter: 44.1 kg.   -Dietitian consult  Subjective: Patient was resting comfortably when seen today.  Denies any pain or any other complaints.  She wants to go home.  Physical Exam: Vitals:   08/15/21 0000 08/15/21 0400 08/15/21 0800 08/15/21 1000   BP: (!) 136/96 133/83 (!) 152/93 (!) 130/97  Pulse: 77 67 66   Resp: '11 10 15   '$ Temp: 98.4 F (36.9 C) 97.9 F (36.6 C) (!) 96.8 F (36 C) 97.9 F (36.6 C)  TempSrc: Bladder  Bladder   SpO2: 100% 100% 100% 98%  Weight:       General.  Frail and severely malnourished lady, in no acute distress. Pulmonary.  Lungs clear bilaterally, normal respiratory effort. CV.  Regular rate and rhythm, no JVD, rub or murmur. Abdomen.  Soft, nontender, nondistended, BS positive. CNS.  Alert and oriented .  No focal neurologic deficit. Extremities.  No edema, no cyanosis, pulses intact and symmetrical. Psychiatry.  Judgment and insight appears normal.  Data Reviewed: Prior data reviewed  Family Communication: Tried calling daughter and sister on the numbers listed in her chart, apparently both numbers are not working.  Patient is improving and most likely can be discharged tomorrow, pending PT/OT evaluation.  Disposition: Status is: Inpatient Remains inpatient appropriate because: Severity of illness   Planned Discharge Destination: To be determined  Time spent: 50 minutes  This record has been created using Systems analyst. Errors have been sought and corrected,but may not always be located. Such creation errors do not reflect on the standard of care.  Author: Lorella Nimrod, MD 08/15/2021 12:42 PM  For on call review [email or web address].

## 2021-08-15 NOTE — Hospital Course (Signed)
Taken from prior notes.  ICU transfer.  [name], is a 61 year old female with history of hypertension, poorly controlled insulin-dependent diabetes mellitus, history of left PCA territory infarct, right hemiparesis, COPD, history of pancreectomy/splenectomy, history of diverticulitis with abscess leading to colostomy in January 2023, anxiety, depression, who presented to [facility] ER via EMS on 07/2 with altered mental status.  Pts daughter reported to EMS the pt was "fine" at 08:30 am today, however she later found her on the floor prompting EMS notification.  Per EMS run sheet upon their arrival at pts home she was found pale, cold, and clammy.  Her CBG readings were 442 and EMS was unable to obtain a temperature.  Pt placed on 6L nasal canula.  En route to the ER code sepsis initiated and pt received 500 ml iv fluid bolus. Pt remained altered and placed on 15L NRB.  She was recently hospitalized between 06/14 to 06/16 following treatment of CAP and DKA.     ED Course In the ER pt alert but disoriented.  ED vital signs were: bp 74/64, hr 105, resp rate 13, pt hypothermic, and O2 sats were 100%.  Sepsis protocol initiated: pt received cefepime, flagyl, vancomycin, 2.5 LR fluid bolus.  Labs were obtained.   CMP: Na+ 131, CO2 <7, glucose 592, BUN 28, creatinine 1.38, anion gap not calculated, albumin 2.6, total bilirubin 1.6, lactic acid 3.7, and pct 0.11 CBC: WBC 13.0 and Hgb 11.0 CXR: Increased right lower lobe infiltrate, suspicious for pneumonia .  Admitted with PCCM for concern of DKA and sepsis with septic shock secondary to pneumonia. Patient initially received broad-spectrum antibiotics with Zosyn, vancomycin and Flagyl under sepsis protocol and then transitioned to Unasyn for concern of aspiration pneumonia. Patient also required pressors due to insufficient response to IV fluid.  7/4: Patient now hemodynamically stable.  Off the pressors.  Insulin infusion was switched with basal and  short-acting.  Care is being transitioned to [facility].  Mild hypokalemia and hypophosphatemia which is being repleted.  7/5: Remained hemodynamically stable.  Awaiting placement.  Ordered removal of central line. PT is recommending SNF-TOC is working on it

## 2021-08-15 NOTE — Progress Notes (Signed)
Sent Dr. Reesa Chew secure chat to make her aware that patient's diastolic BP has been running around 90-105 today with current BP 139/96. Waiting for reply from MD.

## 2021-08-15 NOTE — Assessment & Plan Note (Signed)
Patient admitted with DKA which has been resolved.  Insulin infusion has been switched with basal and short-acting. -Continue with Semglee 13 units daily -Continue with SSI

## 2021-08-15 NOTE — Assessment & Plan Note (Signed)
-   Continue with home Lipitor

## 2021-08-15 NOTE — Assessment & Plan Note (Signed)
-   Restart home Zyprexa and mirtazapine

## 2021-08-15 NOTE — Consult Note (Signed)
Brittany [name] for Electrolyte Monitoring and Replacement   Recent Labs: Potassium (mmol/L)  Date Value  08/15/2021 3.3 (L)  08/17/2011 3.6   Magnesium (mg/dL)  Date Value  08/15/2021 1.9   Calcium (mg/dL)  Date Value  08/15/2021 8.2 (L)   Calcium, Total (mg/dL)  Date Value  08/17/2011 9.7   Albumin (g/dL)  Date Value  08/13/2021 2.3 (L)  04/04/2011 4.5   Phosphorus (mg/dL)  Date Value  08/15/2021 2.4 (L)   Sodium (mmol/L)  Date Value  08/15/2021 136  08/17/2011 139   Assessment: Brittany [name] is a 61 y.o. female w/ PMH of DM, HLD, HTN, RLS, Pancreatic Cancer s/p Distal pancreatectomy and splenectomy, recent PNA, CVA, dysphagia admitted on 08/13/2021 with sepsis & DKA. Started on insulin gtt. Pharmacy has been consulted for electrolyte mgmt.  Patient has been transitioned off of insulin gtt and onto [place] insulin regimen  MIVF: LR at 50 cc/hr  Goal of Therapy:  Electrolytes within normal limits  Plan:  K 3.3, Mag 1.9  Phos 2.4  Scr 0.70  Na 136 -KCL 10 meq IV x 2 doses -- potassium phosphate 15 mmol IV x 1  --Follow-up electrolytes with AM labs tomorrow  [name] A 08/15/2021 7:38 AM

## 2021-08-15 NOTE — Assessment & Plan Note (Signed)
S/p pancreatectomy.  Being managed by oncology as an outpatient. -Continue with Creon

## 2021-08-15 NOTE — Progress Notes (Signed)
Report called to Bet, RN on 1A. Patient will be moved to room 151.

## 2021-08-15 NOTE — Assessment & Plan Note (Signed)
-   Continue home aspirin and Lipitor 

## 2021-08-15 NOTE — Progress Notes (Signed)
Patient moved to room 151 by bed with Jinny Blossom, RN. Patient alert with no distress noted when leaving ICU.

## 2021-08-15 NOTE — Assessment & Plan Note (Signed)
Patient was not on any antihypertensive medications at home. Blood pressure mildly elevated. -If remain elevated we will start her on low-dose losartan

## 2021-08-15 NOTE — Assessment & Plan Note (Signed)
Patient presented with severe sepsis and septic shock secondary to pneumonia.  Initially required pressors due to insufficient response to IV fluid.  Now off the pressors and blood pressure mildly elevated. Received broad-spectrum antibiotics and IV fluid per sepsis protocol. Antibiotics were switched with Unasyn for concern of aspiration pneumonia. -Continue with Unasyn -Continue with supportive care

## 2021-08-15 NOTE — Evaluation (Signed)
Physical Therapy Evaluation Patient Details Name: Brittany [name] MRN: [id number] DOB: 13-Mar-1960 Today's Date: 08/15/2021  History of Present Illness  Pt. is a 61 y.o. F presenting to [facility] on 7/2 with AMS. Admitted to SDU with acute metabolic encephalopathy, severe sepsis with septic shock secondary to pneumonia likely aspiration, hypothermia, and DKA. 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  Clinical Impression  Patient sleeping upon arrival to room; awakens to voice/light touch.  Frequently closes eyes back, but responds to repeated stimulation.  Oriented to self-responds to name; follows some very simple commands, but often requires hand-over-hand to initiate movement (with very delayed processing).  Very minimal attempts at spontaneous verbalization throughout session.  Generally weak and deconditioned throughout all extremities, but no significant focal weakness appreciated.  Able to complete bed mobility with mod assist; sit/stand and bed/chair transfer with RW, min assist.  Constant cuing for attention to task, management/use of RW; does attempt spontaneous sitting at times (requiring max assist/cuing for correction). Will plan to progress gait efforts next session; anticipate need for chair follow for optimal safety. Would benefit from skilled PT to address above deficits and promote optimal return to PLOF.; recommend transition to STR upon discharge from acute hospitalization.        Recommendations for follow up therapy are one component of a multi-disciplinary discharge planning process, led by the attending physician.  Recommendations may be updated based on patient status, additional functional criteria and insurance authorization.  Follow Up Recommendations Skilled nursing-short term rehab (<3  hours/day) Can patient physically be transported by private vehicle: Yes    Assistance Recommended at Discharge Frequent or constant Supervision/Assistance  Patient can return home with the following  A little help with walking and/or transfers;A little help with bathing/dressing/bathroom;Direct supervision/assist for medications management;Direct supervision/assist for financial management;Assist for transportation;Help with stairs or ramp for entrance    Equipment Recommendations    Recommendations for Other Services       Functional Status Assessment Patient has had a recent decline in their functional status and demonstrates the ability to make significant improvements in function in a reasonable and predictable amount of time.     Precautions / Restrictions Precautions Precautions: Fall Precaution Comments: colostomy Restrictions Weight Bearing Restrictions: No      Mobility  Bed Mobility Overal bed mobility: Needs Assistance Bed Mobility: Supine to Sit     Supine to sit: Min assist, Mod assist     General bed mobility comments: hand-over-hand to initiate movement    Transfers Overall transfer level: Needs assistance Equipment used: Rolling walker (2 wheels) Transfers: Sit to/from Stand, Bed to chair/wheelchair/BSC Sit to Stand: Min assist Stand pivot transfers: Min assist, Mod assist         General transfer comment: pulls on RW; constant assist for walker position/management.  Does attempt spontaneous sitting at times, max assist for redirection    Ambulation/Gait               General Gait Details: deferred this date due to lethargy, limited command-following; anticipate need for chair follow in subsequent session with gait efforts  Stairs            Wheelchair Mobility    Modified Rankin (Stroke Patients Only)       Balance Overall balance assessment: Needs assistance Sitting-balance support: No upper extremity supported, Feet  supported Sitting balance-Leahy Scale: Fair  Sitting balance - Comments: cga/close sup   Standing balance support: Bilateral upper extremity supported Standing balance-Leahy Scale: Poor                               Pertinent Vitals/Pain Pain Assessment Pain Assessment: Faces Faces Pain Scale: No hurt    Home Living Family/patient expects to be discharged to:: Unsure                   Additional Comments: No family present, per chart daughter found her down    Prior Function Prior Level of Function : Patient poor historian/Family not available                     Hand Dominance        Extremity/Trunk Assessment   Upper Extremity Assessment Upper Extremity Assessment: Generalized weakness (grossly at least 4-/5 throughout; no focal weakness appreciated)    Lower Extremity Assessment Lower Extremity Assessment: Generalized weakness (grossly at least 4-/5 throughout; no focal weakness appreciated)       Communication   Communication: Expressive difficulties  Cognition Arousal/Alertness: Awake/alert Behavior During Therapy: Flat affect Overall Cognitive Status: No family/caregiver present to determine baseline cognitive functioning                                 General Comments: Responds to name and answers simple questions; very minimal verbalizations throughout session.  Does follow simple, one-step commands with increased time for processing.  Limited insight/awareness into situation and overall safety needs        General Comments      Exercises     Assessment/Plan    PT Assessment Patient needs continued PT services  PT Problem List Decreased strength;Decreased range of motion;Decreased activity tolerance;Decreased balance;Decreased mobility;Decreased knowledge of use of DME;Decreased safety awareness;Decreased knowledge of precautions;Cardiopulmonary status limiting activity       PT Treatment Interventions DME  instruction;Gait training;Stair training;Functional mobility training;Therapeutic activities;Therapeutic exercise;Balance training;Patient/family education;Cognitive remediation    PT Goals (Current goals can be found in the Care Plan section)  Acute Rehab PT Goals PT Goal Formulation: Patient unable to participate in goal setting Time For Goal Achievement: 08/29/21 Potential to Achieve Goals: Fair    Frequency Min 2X/week     Co-evaluation               AM-PAC PT "6 Clicks" Mobility  Outcome Measure Help needed turning from your back to your side while in a flat bed without using bedrails?: A Little Help needed moving from lying on your back to sitting on the side of a flat bed without using bedrails?: A Lot Help needed moving to and from a bed to a chair (including a wheelchair)?: A Little Help needed standing up from a chair using your arms (e.g., wheelchair or bedside chair)?: A Little Help needed to walk in hospital room?: A Lot Help needed climbing 3-5 steps with a railing? : A Lot 6 Click Score: 15    End of Session Equipment Utilized During Treatment: Gait belt Activity Tolerance: Patient tolerated treatment well Patient left: in chair;with call bell/phone within reach;with chair alarm set Nurse Communication: Mobility status PT Visit Diagnosis: Muscle weakness (generalized) (M62.81);Difficulty in walking, not elsewhere classified (R26.2)    Time: 1610-9604 PT Time Calculation (min) (ACUTE ONLY): 16 min   Charges:   PT Evaluation $PT  Eval Moderate Complexity: 1 Mod        Johnn Krasowski H. Owens Shark, PT, DPT, NCS 08/15/21, 12:53 PM [phone]

## 2021-08-15 NOTE — Evaluation (Signed)
Occupational Therapy Evaluation Patient Details Name: Brittany [name] MRN: [id number] DOB: 20-Sep-1960 Today's Date: 08/15/2021   History of Present Illness Pt. is a 61 y.o. F presenting to [facility] on 7/2 with AMS. Admitted to SDU with acute metabolic encephalopathy, severe sepsis with septic shock secondary to pneumonia likely aspiration, hypothermia, and DKA. 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   Clinical Impression   Pt was seen for OT evaluation this date. No family present and pt alert and oriented to self only, perseverating on birth year when other simple questions provided. Pt able to follow <50% of simple commands during session. Pt completed bed mobility with CGA, CGA for static sitting balance due to decreased safety and pt attempting to lay down x2 without warning and no regard for safety with lines/leads. Currently pt demonstrates impairments in safety awareness, cognition, strength, and balance as described below (See OT problem list) which functionally limit her ability to perform ADL/self-care tasks including toileting, bathing, dressing, and ADL mobility. Pt currently requires MIN-MOD A for STS transfer EOB with RW and MAX VC for safety as she turns to adjust linens unexpectedly. CGA-MIN A for a couple lateral steps EOB but then pt sits and returns to supine. Pt would benefit from skilled OT services to address noted impairments and functional limitations (see below for any additional details) in order to maximize safety and independence while minimizing falls risk and caregiver burden. Upon hospital discharge, recommend STR to maximize pt safety and return to PLOF.     Recommendations for follow up therapy are one component of a multi-disciplinary discharge planning process, led by the attending  physician.  Recommendations may be updated based on patient status, additional functional criteria and insurance authorization.   Follow Up Recommendations  Skilled nursing-short term rehab (<3 hours/day)    Assistance Recommended at Discharge Frequent or constant Supervision/Assistance  Patient can return home with the following A lot of help with walking and/or transfers;A lot of help with bathing/dressing/bathroom;Assistance with cooking/housework;Assist for transportation;Help with stairs or ramp for entrance;Direct supervision/assist for medications management;Direct supervision/assist for financial management    Functional Status Assessment  Patient has had a recent decline in their functional status and demonstrates the ability to make significant improvements in function in a reasonable and predictable amount of time.  Equipment Recommendations  Other (comment) (defer to next venue)    Recommendations for Other Services       Precautions / Restrictions Precautions Precautions: Fall Precaution Comments: colostomy Restrictions Weight Bearing Restrictions: No      Mobility Bed Mobility Overal bed mobility: Needs Assistance Bed Mobility: Supine to Sit, Sit to Supine     Supine to sit: Min guard, HOB elevated Sit to supine: Min guard   General bed mobility comments: once EOB pt endorses some dizziness. Unable to get accurate BP reading due to pt moving RUE despite VC/TC    Transfers Overall transfer level: Needs assistance Equipment used: Rolling walker (2 wheels) Transfers: Sit to/from Stand Sit to Stand: Mod assist, Min assist           General transfer comment: MOD VC for sequencing      Balance Overall balance assessment: Needs assistance Sitting-balance support: Single extremity supported, Bilateral upper extremity supported, Feet unsupported Sitting balance-Leahy Scale: Fair Sitting balance - Comments: CGA for safety as she tends to lay down without  warning 2x  during session   Standing balance support: Bilateral upper extremity supported, Single extremity supported, During functional activity Standing balance-Leahy Scale: Fair Standing balance comment: Once in standing, turns and begins to adjust linens requiring MAX VC to redirect but no LOB                           ADL either performed or assessed with clinical judgement   ADL                                         General ADL Comments: Pt currently requires MIN A For LB ADL tasks involving STS Transfers with RW + MAX VC/simple commands for safety, MIN-MOD A from tall bed for ADL Transfers     Vision         Perception     Praxis      Pertinent Vitals/Pain Pain Assessment Pain Assessment: No/denies pain     Hand Dominance     Extremity/Trunk Assessment Upper Extremity Assessment Upper Extremity Assessment: Generalized weakness   Lower Extremity Assessment Lower Extremity Assessment: Generalized weakness       Communication     Cognition Arousal/Alertness: Awake/alert Behavior During Therapy: Impulsive Overall Cognitive Status: No family/caregiver present to determine baseline cognitive functioning                                 General Comments: Pt alert, oriented to self and DOB only, and follows <50% of simple VC for safety during session. When asked about PLOF pt perseverates on birth year as primary response. Attempts to lay down without warning a couple times during the session requiring MAX VC and MIN-MOD A to correct     General Comments       Exercises Other Exercises Other Exercises: Pt instructed in RW mgt with lateral steps EOB, MAX VC for safety   Shoulder Instructions      Home Living Family/patient expects to be discharged to:: Unsure                                 Additional Comments: No family present, per chart daughter found her down      Prior Functioning/Environment  Prior Level of Function : Patient poor historian/Family not available                        OT Problem List: Decreased strength;Decreased cognition;Decreased safety awareness;Impaired balance (sitting and/or standing);Decreased knowledge of use of DME or AE;Decreased activity tolerance      OT Treatment/Interventions: Self-care/ADL training;Therapeutic exercise;Therapeutic activities;Cognitive remediation/compensation;DME and/or AE instruction;Patient/family education;Balance training;Energy conservation    OT Goals(Current goals can be found in the care plan section) Acute Rehab OT Goals Patient Stated Goal: pt reports wanting to sleep OT Goal Formulation: With patient Time For Goal Achievement: 08/29/21 Potential to Achieve Goals: Good ADL Goals Pt Will Perform Upper Body Dressing: sitting;with supervision;with set-up Pt Will Perform Lower Body Dressing: sit to/from stand;with min assist Pt Will Transfer to Toilet: ambulating;with min assist;bedside commode (LRAD) Pt Will Perform Toileting - Clothing Manipulation and hygiene: with supervision;with set-up;sitting/lateral leans Additional ADL Goal #1: Pt will follow >75% of simple VC during ADL/mobility task to maximize safety, 3/3 opportunities.  OT Frequency: Min 2X/week    Co-evaluation              AM-PAC OT "6 Clicks" Daily Activity     Outcome Measure Help from another person eating meals?: None Help from another person taking care of personal grooming?: A Little Help from another person toileting, which includes using toliet, bedpan, or urinal?: A Lot Help from another person bathing (including washing, rinsing, drying)?: A Lot Help from another person to put on and taking off regular upper body clothing?: A Little Help from another person to put on and taking off regular lower body clothing?: A Lot 6 Click Score: 16   End of Session Equipment Utilized During Treatment: Rolling walker (2 wheels)  Activity  Tolerance: Patient tolerated treatment well Patient left: in bed;with call bell/phone within reach;with bed alarm set;with nursing/sitter in room  OT Visit Diagnosis: Other abnormalities of gait and mobility (R26.89);Muscle weakness (generalized) (M62.81)                Time: 9458-5929 OT Time Calculation (min): 17 min Charges:  OT General Charges $OT Visit: 1 Visit OT Evaluation $OT Eval Moderate Complexity: 1 Mod  Ardeth Perfect., MPH, MS, OTR/L ascom [phone] 08/15/21, 10:35 AM

## 2021-08-16 DIAGNOSIS — E1110 Type 2 diabetes mellitus with ketoacidosis without coma: Secondary | ICD-10-CM | POA: Diagnosis not present

## 2021-08-16 DIAGNOSIS — I10 Essential (primary) hypertension: Secondary | ICD-10-CM | POA: Diagnosis not present

## 2021-08-16 DIAGNOSIS — E43 Unspecified severe protein-calorie malnutrition: Secondary | ICD-10-CM

## 2021-08-16 DIAGNOSIS — G2581 Restless legs syndrome: Secondary | ICD-10-CM

## 2021-08-16 LAB — GLUCOSE, CAPILLARY
Glucose-Capillary: 120 mg/dL — ABNORMAL HIGH (ref 70–99)
Glucose-Capillary: 126 mg/dL — ABNORMAL HIGH (ref 70–99)
Glucose-Capillary: 174 mg/dL — ABNORMAL HIGH (ref 70–99)
Glucose-Capillary: 232 mg/dL — ABNORMAL HIGH (ref 70–99)
Glucose-Capillary: 298 mg/dL — ABNORMAL HIGH (ref 70–99)
Glucose-Capillary: 319 mg/dL — ABNORMAL HIGH (ref 70–99)
Glucose-Capillary: 342 mg/dL — ABNORMAL HIGH (ref 70–99)
Glucose-Capillary: 45 mg/dL — ABNORMAL LOW (ref 70–99)
Glucose-Capillary: 59 mg/dL — ABNORMAL LOW (ref 70–99)
Glucose-Capillary: 67 mg/dL — ABNORMAL LOW (ref 70–99)
Glucose-Capillary: 69 mg/dL — ABNORMAL LOW (ref 70–99)

## 2021-08-16 LAB — PHOSPHORUS: Phosphorus: 2.6 mg/dL (ref 2.5–4.6)

## 2021-08-16 LAB — CBC WITH DIFFERENTIAL/PLATELET
Abs Immature Granulocytes: 0.01 10*3/uL (ref 0.00–0.07)
Basophils Absolute: 0 10*3/uL (ref 0.0–0.1)
Basophils Relative: 0 %
Eosinophils Absolute: 0 10*3/uL (ref 0.0–0.5)
Eosinophils Relative: 0 %
HCT: 30.3 % — ABNORMAL LOW (ref 36.0–46.0)
Hemoglobin: 10.2 g/dL — ABNORMAL LOW (ref 12.0–15.0)
Immature Granulocytes: 0 %
Lymphocytes Relative: 52 %
Lymphs Abs: 3.5 10*3/uL (ref 0.7–4.0)
MCH: 28 pg (ref 26.0–34.0)
MCHC: 33.7 g/dL (ref 30.0–36.0)
MCV: 83.2 fL (ref 80.0–100.0)
Monocytes Absolute: 0.3 10*3/uL (ref 0.1–1.0)
Monocytes Relative: 4 %
Neutro Abs: 3 10*3/uL (ref 1.7–7.7)
Neutrophils Relative %: 44 %
Platelets: 363 10*3/uL (ref 150–400)
RBC: 3.64 MIL/uL — ABNORMAL LOW (ref 3.87–5.11)
RDW: 17.2 % — ABNORMAL HIGH (ref 11.5–15.5)
WBC: 6.9 10*3/uL (ref 4.0–10.5)
nRBC: 1.6 % — ABNORMAL HIGH (ref 0.0–0.2)

## 2021-08-16 LAB — HEMOGLOBIN A1C
Hgb A1c MFr Bld: >15.5 % — ABNORMAL HIGH (ref 4.8–5.6)
Mean Plasma Glucose: >398 mg/dL

## 2021-08-16 LAB — BASIC METABOLIC PANEL
Anion gap: 8 (ref 5–15)
BUN: 7 mg/dL — ABNORMAL LOW (ref 8–23)
CO2: 30 mmol/L (ref 22–32)
Calcium: 8.8 mg/dL — ABNORMAL LOW (ref 8.9–10.3)
Chloride: 100 mmol/L (ref 98–111)
Creatinine, Ser: 0.53 mg/dL (ref 0.44–1.00)
GFR, Estimated: >60 mL/min (ref >60)
Glucose, Bld: 142 mg/dL — ABNORMAL HIGH (ref 70–99)
Potassium: 3.4 mmol/L — ABNORMAL LOW (ref 3.5–5.1)
Sodium: 138 mmol/L (ref 135–145)

## 2021-08-16 LAB — MAGNESIUM: Magnesium: 1.8 mg/dL (ref 1.7–2.4)

## 2021-08-16 MED ORDER — POTASSIUM CHLORIDE CRYS ER 20 MEQ PO TBCR
40.0000 meq | EXTENDED_RELEASE_TABLET | Freq: Once | ORAL | Status: AC
  Administered 2021-08-16: 40 meq via ORAL
  Filled 2021-08-16: qty 2

## 2021-08-16 MED ORDER — INSULIN GLARGINE-YFGN 100 UNIT/ML ~~LOC~~ SOLN
10.0000 [IU] | SUBCUTANEOUS | Status: DC
  Administered 2021-08-17 – 2021-08-18 (×2): 10 [IU] via SUBCUTANEOUS
  Filled 2021-08-16 (×2): qty 0.1

## 2021-08-16 MED ORDER — INSULIN ASPART 100 UNIT/ML IJ SOLN
0.0000 [IU] | Freq: Every day | INTRAMUSCULAR | Status: DC
  Administered 2021-08-16 – 2021-08-17 (×2): 2 [IU] via SUBCUTANEOUS
  Administered 2021-08-19 – 2021-08-23 (×2): 3 [IU] via SUBCUTANEOUS
  Administered 2021-08-24 – 2021-08-25 (×2): 5 [IU] via SUBCUTANEOUS
  Filled 2021-08-16 (×7): qty 1

## 2021-08-16 MED ORDER — MAGNESIUM SULFATE 2 GM/50ML IV SOLN
2.0000 g | Freq: Once | INTRAVENOUS | Status: AC
  Administered 2021-08-16: 2 g via INTRAVENOUS
  Filled 2021-08-16: qty 50

## 2021-08-16 MED ORDER — INSULIN ASPART 100 UNIT/ML IJ SOLN
0.0000 [IU] | Freq: Three times a day (TID) | INTRAMUSCULAR | Status: DC
  Administered 2021-08-16: 9 [IU] via SUBCUTANEOUS
  Administered 2021-08-16 – 2021-08-17 (×2): 5 [IU] via SUBCUTANEOUS
  Administered 2021-08-17: 15 [IU] via SUBCUTANEOUS
  Filled 2021-08-16 (×3): qty 1

## 2021-08-16 NOTE — Inpatient Diabetes Management (Addendum)
Inpatient Diabetes Program Recommendations  AACE/ADA: New Consensus Statement on Inpatient Glycemic Control (2015)  Target Ranges:  Prepandial:   less than 140 mg/dL      Peak postprandial:   less than 180 mg/dL (1-2 hours)      Critically ill patients:  140 - 180 mg/dL    Latest Reference Range & Units 08/14/21 23:15 08/15/21 03:24 08/15/21 07:54 08/15/21 11:48 08/15/21 16:04 08/15/21 20:55  Glucose-Capillary 70 - 99 mg/dL 133 (H)  2 units Novolog  98 126 (H)  2 units Novolog  13 units Semglee '@0844'$   174 (H)  3 units Novolog  322 (H)  11 units Novolog  97    Latest Reference Range & Units 08/16/21 00:10 08/16/21 00:34 08/16/21 00:56 08/16/21 04:01 08/16/21 07:56 08/16/21 08:21  Glucose-Capillary 70 - 99 mg/dL 59 (L) 69 (L) 120 (H) 174 (H)  3 units Novolog  13 units Semglee '@0456'$  45 (L) 67 (L)      History: Diabetes, CVA, Pancreactomy/splenectomy   Home DM Meds: Humalog 5 units TID with meals                             Lantus 10 units QHS   Current Orders: Novolog Moderate Correction Scale/ SSI (0-15 units) Q4 hours                           Semglee 13 units Q24H    MD- Note Hypoglycemia at 12am and 8am today.  Please consider:  1. Reduce Semlgee slightly to 10 units Daily AM (home dose)  2. Reduce Novolog SSI to the Sensitive 0-9 unit scale   Patient is well known to the Inpatient Diabetes Team This is the 6th admission to [facility] since January 2023 Per MD notes from last admit, daughter administers patient's insulin, however patient eats what ever she wants and is not compliant with a diabetic diet--Unsure if pt getting proper supervision and care at home??   --Will follow patient during hospitalization--  Wyn Quaker RN, MSN, CDE Diabetes Coordinator Inpatient Glycemic Control Team Team Pager: [phone] (8a-5p)

## 2021-08-16 NOTE — Progress Notes (Signed)
MD put in the consult for d/c central line, but it is EJ (external Jugular) as peripheral line. Explained patient's RN and he will remove EJ. HS [employer]

## 2021-08-16 NOTE — TOC Progression Note (Addendum)
Transition of Care [facility]) - Progression Note    Patient Details  Name: Brittany [name] MRN: [id number] Date of Birth: Aug 28, 1960  Transition of Care [facility]) CM/SW [place], LCSW Phone Number: 08/16/2021, 10:35 AM  Clinical Narrative:   Tried calling daughter to discuss SNF recommendation but phone number is incorrect. RN will notify CSW if she comes to the hospital.  3:16 pm: Still unable to get in touch with daughter today. Called patient's mother who stated she is unable to reach her either. Phone number is correct. She will have daughter call me if she talks to her.  Expected Discharge Plan: [place] Barriers to Discharge: Continued Medical Work up  Expected Discharge Plan and Services Expected Discharge Plan: [place] arrangements for the past 2 months: Single Family Home                                       Social Determinants of Health (SDOH) Interventions    Readmission Risk Interventions    03/06/2021    9:13 AM 02/16/2021    2:45 PM  Readmission Risk Prevention Plan  Transportation Screening Complete Complete  Medication Review ([place]) Complete Complete  PCP or Specialist appointment within 3-5 days of discharge Complete   SW Recovery Care/Counseling Consult  Complete  Palliative Care Screening Not Applicable Not [place] Not Applicable Not Applicable

## 2021-08-16 NOTE — Progress Notes (Signed)
Nutrition Follow-up  DOCUMENTATION CODES:   Severe malnutrition in context of chronic illness  INTERVENTION:   -Continue MVI with minerals daily -Continue Glucerna Shake po TID, each supplement provides 220 kcal and 10 grams of protein   NUTRITION DIAGNOSIS:   Severe Malnutrition related to chronic illness (COPD, pancreatic cancer) as evidenced by severe fat depletion, severe muscle depletion.  Ongoing  GOAL:   Patient will meet greater than or equal to 90% of their needs  Progressing   MONITOR:   Diet advancement, Labs, Weight trends, Skin, I & O's  REASON FOR ASSESSMENT:   Rounds    ASSESSMENT:   61 y.o. 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  7/3- s/p bedside swallow screen, advanced to carb modified diet  Reviewed I/O's: -508 ml x 24 hours and +5.3 L since admission  UOP: 2.2 L x 24 hours   Pt unavailable at time of visit. Attempted to speak with pt via call to hospital room phone, however, unable to reach.   Pt with good appetite. Noted meal completions 80-100%. Pt also drinking of Glucerna supplements.   Medications reviewed and include remeron and creon.   Labs reviewed: K: 3.4, CBGS: 77-412 (inpatient orders for glycemic control are 0-5 units insulin aspart daily at bedtime, 10 units insulin glargine-yfgn daily, and 0-9 units insulin aspart TID with meals).    Diet Order:   Diet Order             Diet Carb Modified Fluid consistency:  Thin; Room service appropriate? Yes  Diet effective now                   EDUCATION NEEDS:   Education needs have been addressed  Skin:  Skin Assessment: Reviewed RN Assessment  Last BM:  08/16/21 (325 ml via ileostomy)  Height:   Ht Readings from Last 1 Encounters:  07/26/21 '5\' 3"'$  (1.6 m)    Weight:   Wt Readings from Last 1 Encounters:  08/13/21 44.1 kg    Ideal Body Weight:  52.3 kg  BMI:  Body mass index is 17.22 kg/m.  Estimated Nutritional Needs:   Kcal:  1400-1600kcal/day  Protein:  70-80g/day  Fluid:  1.3-1.5L/day    Loistine Chance, RD, LDN, [place] Registered Dietitian II Certified Diabetes Care and Education Specialist Please refer to [facility] for RD and/or RD on-call/weekend/after hours pager

## 2021-08-16 NOTE — Progress Notes (Signed)
Progress Note   [name] NTZ:[id number] DOB: Jul 19, 1960 DOA: 08/13/2021     3 DOS: the patient was seen and examined on 08/16/2021   Brief hospital course: Taken from prior notes.  ICU transfer.  [name], is a 61 year old female with history of hypertension, poorly controlled insulin-dependent diabetes mellitus, history of left PCA territory infarct, right hemiparesis, COPD, history of pancreectomy/splenectomy, history of diverticulitis with abscess leading to colostomy in January 2023, anxiety, depression, who presented to [facility] via EMS on 07/2 with altered mental status.  Pts daughter reported to EMS the pt was "fine" at 08:30 am today, however she later found her on the floor prompting EMS notification.  Per EMS run sheet upon their arrival at pts home she was found pale, cold, and clammy.  Her CBG readings were 442 and EMS was unable to obtain a temperature.  Pt placed on 6L nasal canula.  En route to the ER code sepsis initiated and pt received 500 ml iv fluid bolus. Pt remained altered and placed on 15L NRB.  She was recently hospitalized between 06/14 to 06/16 following treatment of CAP and DKA.     ED Course In the ER pt alert but disoriented.  ED vital signs were: bp 74/64, hr 105, resp rate 13, pt hypothermic, and O2 sats were 100%.  Sepsis protocol initiated: pt received cefepime, flagyl, vancomycin, 2.5 LR fluid bolus.  Labs were obtained.   CMP: Na+ 131, CO2 <7, glucose 592, BUN 28, creatinine 1.38, anion gap not calculated, albumin 2.6, total bilirubin 1.6, lactic acid 3.7, and pct 0.11 CBC: WBC 13.0 and Hgb 11.0 CXR: Increased right lower lobe infiltrate, suspicious for pneumonia .  Admitted with PCCM for concern of DKA and sepsis with septic shock secondary to pneumonia. Patient initially received broad-spectrum antibiotics with Zosyn, vancomycin and Flagyl under sepsis protocol and then transitioned to Unasyn for concern of aspiration pneumonia. Patient also  required pressors due to insufficient response to IV fluid.  7/4: Patient now hemodynamically stable.  Off the pressors.  Insulin infusion was switched with basal and short-acting.  Care is being transitioned to [facility].  Mild hypokalemia and hypophosphatemia which is being repleted.  7/5: Remained hemodynamically stable.  Awaiting placement.  Ordered removal of central line. PT is recommending SNF-TOC is working on it   Assessment and Plan: * DKA (diabetic ketoacidosis) ([place]) Patient admitted with DKA which has been resolved.  Insulin infusion has been switched with basal and short-acting. -Continue with Semglee 13 units daily -Continue with SSI  Severe sepsis [facility]) Patient presented with severe sepsis and septic shock secondary to pneumonia.  Initially required pressors due to insufficient response to IV fluid.  Now off the pressors and blood pressure mildly elevated. Received broad-spectrum antibiotics and IV fluid per sepsis protocol. Antibiotics were switched with Unasyn for concern of aspiration pneumonia. -Continue with Unasyn -Continue with supportive care  Essential hypertension Patient was not on any antihypertensive medications at home. Blood pressure mildly elevated. 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  Being managed by oncology as an outpatient. -Continue with Creon  HLD (hyperlipidemia) - Continue with home Lipitor  Depression with anxiety - Restart home Zyprexa and mirtazapine  Protein-calorie malnutrition, severe Estimated body mass index is 17.22 kg/m as calculated from the following:   Height as of 07/26/21:  $'5\' 3"'H$  (1.6 m).   Weight as of this encounter: 44.1 kg.   -Dietitian consult        Subjective: Patient was resting comfortably when seen today.  No new  complaints.  Physical Exam: Vitals:   08/15/21 1954 08/16/21 0436 08/16/21 1038 08/16/21 1649  BP: (!) 130/91 (!) 150/95 116/87 (!) 138/95  Pulse: 74 62 73 80  Resp: '16 17 16 15  '$ Temp: 97.9 F (36.6 C) (!) 97.3 F (36.3 C) 98 F (36.7 C) 98.1 F (36.7 C)  TempSrc:      SpO2: 100% 100% 100% 99%  Weight:       General.  Frail and malnourished lady, in no acute distress. Pulmonary.  Lungs clear bilaterally, normal respiratory effort. CV.  Regular rate and rhythm, no JVD, rub or murmur. Abdomen.  Soft, nontender, nondistended, BS positive. CNS.  Alert and oriented .  No focal neurologic deficit. Extremities.  No edema, no cyanosis, pulses intact and symmetrical. Psychiatry.  Judgment and insight appears normal.  Data Reviewed: Prior data reviewed  Family Communication: Discussed with patient  Disposition: Status is: Inpatient Remains inpatient appropriate because: Waiting for placement   Planned Discharge Destination: Skilled nursing facility  Time spent: 42 minutes  This record has been created using Systems analyst. Errors have been sought and corrected,but may not always be located. Such creation errors do not reflect on the standard of care.  Author: Lorella Nimrod, MD 08/16/2021 5:09 PM  For on call review [email or web address].

## 2021-08-16 NOTE — Assessment & Plan Note (Signed)
Patient presented with severe sepsis and septic shock secondary to pneumonia.  Initially required pressors due to insufficient response to IV fluid.  Now off the pressors and blood pressure mildly elevated. Received broad-spectrum antibiotics and IV fluid per sepsis protocol. Antibiotics were switched with Unasyn for concern of aspiration pneumonia. -Continue with Unasyn -Continue with supportive care

## 2021-08-16 NOTE — Care Management Important Message (Signed)
Important Message  Patient Details  Name: Brittany [name] MRN: [id number] Date of Birth: Mar 29, 1960   Medicare Important Message Given:  N/A - LOS <3 / Initial given by admissions     Juliann Pulse A Ailish Prospero 08/16/2021, 9:44 AM

## 2021-08-16 NOTE — Progress Notes (Signed)
PT Cancellation Note  Patient Details Name: Brittany [name] MRN: [id number] DOB: [birth year]   Cancelled Treatment:     PT attempt. Pt just fell asleep and per sitter did not sleep much. Requested to allow pt to rest. Will return later this afternoon and continue to follow per current POC.    Willette Pa 08/16/2021, 11:01 AM

## 2021-08-16 NOTE — Progress Notes (Signed)
Physical Therapy Treatment Patient Details Name: Brittany [name] MRN: [id number] DOB: 1960-09-24 Today's Date: 08/16/2021   History of Present Illness Pt. is a 61 y.o. F presenting to [facility] on 7/2 with AMS. Admitted to SDU with acute metabolic encephalopathy, severe sepsis with septic shock secondary to pneumonia likely aspiration, hypothermia, and DKA. 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    PT Comments    Pt was long sitting in bed with sitter at bedside. She agrees to session and is cooperative throughout. Does have some impulsivity that makes her a high fall risk. She was however able to consistently follow commands and was able to ambulate in hallway. She reports she uses walking stick at home. Encouraged use of RW at all times now. Pt is agreeable. Pt is impulsive but tolerated session well. Will greatly benefit from SNF at DC to address deficits while maximizing safety with ADLs.    Recommendations for follow up therapy are one component of a multi-disciplinary discharge planning process, led by the attending physician.  Recommendations may be updated based on patient status, additional functional criteria and insurance authorization.  Follow Up Recommendations  Skilled nursing-short term rehab (<3 hours/day)     Assistance Recommended at Discharge Frequent or constant Supervision/Assistance  Patient can return home with the following A little help with walking and/or transfers;A little help with bathing/dressing/bathroom;Direct supervision/assist for medications management;Direct supervision/assist for financial management;Assist for transportation;Help with stairs or ramp for entrance   Equipment Recommendations  Rolling walker (2 wheels);Other (comment)       Precautions / Restrictions  Precautions Precautions: Fall Precaution Comments: colostomy Restrictions Weight Bearing Restrictions: No     Mobility  Bed Mobility Overal bed mobility: Needs Assistance Bed Mobility: Supine to Sit  Supine to sit: Min guard  General bed mobility comments: CGA for safety however no physical assistance required to exit L side of bed.    Transfers Overall transfer level: Needs assistance Equipment used: Rolling walker (2 wheels) Transfers: Sit to/from Stand Sit to Stand: Min guard Stand pivot transfers: Min guard  General transfer comment: CGA to stand from lowest bed    Ambulation/Gait Ambulation/Gait assistance: Min guard Gait Distance (Feet): 200 Feet Assistive device: Rolling walker (2 wheels) Gait Pattern/deviations: Step-through pattern  General Gait Details: Pt was able to ambulate 200 ft total with RW + vcs. CGA for safety throughout with one occasion of min assist to stop pt from falling. Pt is impulsive and lacks insight of deficits/poor safety awareness     Balance Overall balance assessment: Needs assistance Sitting-balance support: No upper extremity supported, Feet supported Sitting balance-Leahy Scale: Fair     Standing balance support: Bilateral upper extremity supported Standing balance-Leahy Scale: Poor Standing balance comment: high fall risk due to impulsivity and poor safety awareness       Cognition Arousal/Alertness: Awake/alert Behavior During Therapy: Flat affect Overall Cognitive Status: No family/caregiver present to determine baseline cognitive functioning    General Comments: Pt is alert but does continue to present with some impulsivity. Does consistently follow commands throughout session.               Pertinent Vitals/Pain Pain Assessment Pain Assessment: No/denies pain     PT Goals (current goals can now be found in the care plan section) Acute Rehab PT Goals Patient Stated Goal: I want to go home." Progress towards PT  goals: Progressing toward goals    Frequency    Min 2X/week      PT Plan Current plan remains appropriate       AM-PAC PT "6 Clicks" Mobility   Outcome Measure  Help needed turning from your back to your side while in a flat bed without using bedrails?: A Little Help needed moving from lying on your back to sitting on the side of a flat bed without using bedrails?: A Lot Help needed moving to and from a bed to a chair (including a wheelchair)?: A Little Help needed standing up from a chair using your arms (e.g., wheelchair or bedside chair)?: A Little Help needed to walk in hospital room?: A Lot Help needed climbing 3-5 steps with a railing? : A Lot 6 Click Score: 15    End of Session   Activity Tolerance: Patient tolerated treatment well Patient left: in chair;with call bell/phone within reach;with chair alarm set;with nursing/sitter in room Nurse Communication: Mobility status PT Visit Diagnosis: Muscle weakness (generalized) (M62.81);Difficulty in walking, not elsewhere classified (R26.2)     Time: 7846-9629 PT Time Calculation (min) (ACUTE ONLY): 31 min  Charges:  $Gait Training: 8-22 mins $Therapeutic Activity: 8-22 mins                    Julaine Fusi PTA 08/16/21, 3:02 PM

## 2021-08-16 NOTE — Consult Note (Signed)
[place] for Electrolyte Monitoring and Replacement   Recent Labs: Potassium (mmol/L)  Date Value  08/16/2021 3.4 (L)  08/17/2011 3.6   Magnesium (mg/dL)  Date Value  08/16/2021 1.8   Calcium (mg/dL)  Date Value  08/16/2021 8.8 (L)   Calcium, Total (mg/dL)  Date Value  08/17/2011 9.7   Albumin (g/dL)  Date Value  08/13/2021 2.3 (L)  04/04/2011 4.5   Phosphorus (mg/dL)  Date Value  08/16/2021 2.6   Sodium (mmol/L)  Date Value  08/16/2021 138  08/17/2011 139   Assessment: Brittany [name] is a 61 y.o. female w/ PMH of DM, HLD, HTN, RLS, Pancreatic Cancer s/p Distal pancreatectomy and splenectomy, recent PNA, CVA, dysphagia admitted on 08/13/2021 with sepsis & DKA. Started on insulin gtt. Pharmacy has been consulted for electrolyte mgmt.  MIVF: LR at 50 cc/hr  Goal of Therapy:  Electrolytes within normal limits  Plan:  --2 grams IV magnesium sulfate x 1 --KCL 40 meq po x 1 --Follow-up electrolytes with AM labs tomorrow  Dallie Piles 08/16/2021 7:01 AM

## 2021-08-17 DIAGNOSIS — E1110 Type 2 diabetes mellitus with ketoacidosis without coma: Secondary | ICD-10-CM | POA: Diagnosis not present

## 2021-08-17 LAB — GLUCOSE, CAPILLARY
Glucose-Capillary: 132 mg/dL — ABNORMAL HIGH (ref 70–99)
Glucose-Capillary: 187 mg/dL — ABNORMAL HIGH (ref 70–99)
Glucose-Capillary: 212 mg/dL — ABNORMAL HIGH (ref 70–99)
Glucose-Capillary: 248 mg/dL — ABNORMAL HIGH (ref 70–99)
Glucose-Capillary: 294 mg/dL — ABNORMAL HIGH (ref 70–99)
Glucose-Capillary: 406 mg/dL — ABNORMAL HIGH (ref 70–99)
Glucose-Capillary: 462 mg/dL — ABNORMAL HIGH (ref 70–99)

## 2021-08-17 LAB — BASIC METABOLIC PANEL
Anion gap: 10 (ref 5–15)
BUN: 10 mg/dL (ref 8–23)
CO2: 27 mmol/L (ref 22–32)
Calcium: 9.1 mg/dL (ref 8.9–10.3)
Chloride: 99 mmol/L (ref 98–111)
Creatinine, Ser: 0.75 mg/dL (ref 0.44–1.00)
GFR, Estimated: >60 mL/min (ref >60)
Glucose, Bld: 204 mg/dL — ABNORMAL HIGH (ref 70–99)
Potassium: 4.5 mmol/L (ref 3.5–5.1)
Sodium: 136 mmol/L (ref 135–145)

## 2021-08-17 LAB — MAGNESIUM: Magnesium: 2.4 mg/dL (ref 1.7–2.4)

## 2021-08-17 LAB — CBC WITH DIFFERENTIAL/PLATELET
Abs Immature Granulocytes: 0.02 10*3/uL (ref 0.00–0.07)
Basophils Absolute: 0 10*3/uL (ref 0.0–0.1)
Basophils Relative: 1 %
Eosinophils Absolute: 0.1 10*3/uL (ref 0.0–0.5)
Eosinophils Relative: 1 %
HCT: 33 % — ABNORMAL LOW (ref 36.0–46.0)
Hemoglobin: 10.6 g/dL — ABNORMAL LOW (ref 12.0–15.0)
Immature Granulocytes: 0 %
Lymphocytes Relative: 55 %
Lymphs Abs: 3.1 10*3/uL (ref 0.7–4.0)
MCH: 27.1 pg (ref 26.0–34.0)
MCHC: 32.1 g/dL (ref 30.0–36.0)
MCV: 84.4 fL (ref 80.0–100.0)
Monocytes Absolute: 0.3 10*3/uL (ref 0.1–1.0)
Monocytes Relative: 6 %
Neutro Abs: 2.1 10*3/uL (ref 1.7–7.7)
Neutrophils Relative %: 37 %
Platelets: 328 10*3/uL (ref 150–400)
RBC: 3.91 MIL/uL (ref 3.87–5.11)
RDW: 17.5 % — ABNORMAL HIGH (ref 11.5–15.5)
WBC: 5.6 10*3/uL (ref 4.0–10.5)
nRBC: 1.1 % — ABNORMAL HIGH (ref 0.0–0.2)

## 2021-08-17 LAB — PHOSPHORUS: Phosphorus: 3.3 mg/dL (ref 2.5–4.6)

## 2021-08-17 MED ORDER — AMOXICILLIN-POT CLAVULANATE 875-125 MG PO TABS
1.0000 | ORAL_TABLET | Freq: Two times a day (BID) | ORAL | Status: AC
Start: 2021-08-17 — End: 2021-08-20
  Administered 2021-08-17 – 2021-08-20 (×8): 1 via ORAL
  Filled 2021-08-17 (×8): qty 1

## 2021-08-17 MED ORDER — INSULIN ASPART 100 UNIT/ML IJ SOLN
0.0000 [IU] | Freq: Three times a day (TID) | INTRAMUSCULAR | Status: DC
  Administered 2021-08-17: 2 [IU] via SUBCUTANEOUS
  Administered 2021-08-18: 3 [IU] via SUBCUTANEOUS
  Administered 2021-08-18 – 2021-08-19 (×2): 5 [IU] via SUBCUTANEOUS
  Administered 2021-08-19 – 2021-08-20 (×3): 8 [IU] via SUBCUTANEOUS
  Administered 2021-08-20: 11 [IU] via SUBCUTANEOUS
  Administered 2021-08-21: 8 [IU] via SUBCUTANEOUS
  Administered 2021-08-21: 15 [IU] via SUBCUTANEOUS
  Administered 2021-08-22: 2 [IU] via SUBCUTANEOUS
  Administered 2021-08-22 (×2): 3 [IU] via SUBCUTANEOUS
  Administered 2021-08-23: 2 [IU] via SUBCUTANEOUS
  Administered 2021-08-23: 3 [IU] via SUBCUTANEOUS
  Administered 2021-08-23: 8 [IU] via SUBCUTANEOUS
  Administered 2021-08-24: 11 [IU] via SUBCUTANEOUS
  Administered 2021-08-25: 2 [IU] via SUBCUTANEOUS
  Administered 2021-08-25: 11 [IU] via SUBCUTANEOUS
  Filled 2021-08-17 (×18): qty 1

## 2021-08-17 MED ORDER — INSULIN ASPART 100 UNIT/ML IJ SOLN
3.0000 [IU] | Freq: Three times a day (TID) | INTRAMUSCULAR | Status: DC
  Administered 2021-08-17 (×2): 3 [IU] via SUBCUTANEOUS
  Filled 2021-08-17 (×2): qty 1

## 2021-08-17 NOTE — Progress Notes (Signed)
Physical Therapy Treatment Patient Details Name: Brittany [name] MRN: [id number] DOB: December 12, 1960 Today's Date: 08/17/2021   History of Present Illness Pt. is a 61 y.o. F presenting to [facility] on 7/2 with AMS. Admitted to SDU with acute metabolic encephalopathy, severe sepsis with septic shock secondary to pneumonia likely aspiration, hypothermia, and DKA. 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    PT Comments    Pt was side lying in bed upon arriving. She is awake but seems a little more disoriented and confused than yesterday. Was agreeable to PT session. She requested to urinate prior to standing and ambulating with RW into hallway. Pt's impulsivity and overall poor safety awareness makes her a high fall risk. She will greatly benefit from continued skilled PT at DC to address balance and safety concerns. Pt was able to ambulate ~ 120 ft prior to c/o severe fatigue/dizziness and requesting immediate seated rest. BP was stable (150s/90s) with pt's symptoms resolving after a few minutes seated in chair. Author elected for pt not to ambulate more and had pt sit in recliner and manually pushed recliner back to room. Pt will benefit from SNF at DC to address deficits while maximizing independence with ADLs. Highly recommend SNF at DC.    Recommendations for follow up therapy are one component of a multi-disciplinary discharge planning process, led by the attending physician.  Recommendations may be updated based on patient status, additional functional criteria and insurance authorization.  Follow Up Recommendations  Skilled nursing-short term rehab (<3 hours/day)     Assistance Recommended at Discharge Frequent or constant Supervision/Assistance  Patient can return home with the following A little help with walking  and/or transfers;A little help with bathing/dressing/bathroom;Direct supervision/assist for medications management;Direct supervision/assist for financial management;Assist for transportation;Help with stairs or ramp for entrance   Equipment Recommendations  Other (comment) (defer to next level of care)       Precautions / Restrictions Precautions Precautions: Fall Precaution Comments: colostomy Restrictions Weight Bearing Restrictions: No     Mobility  Bed Mobility Overal bed mobility: Needs Assistance Bed Mobility: Supine to Sit  Supine to sit: Min guard Sit to supine: Supervision   General bed mobility comments: pt is impulsive but was able to achieve EOB sitting with tactle cues    Transfers Overall transfer level: Needs assistance Equipment used: Rolling walker (2 wheels) Transfers: Sit to/from Stand Sit to Stand: Min guard      General transfer comment: CGA due to impulsivity. pt has extremely poor safety awareness    Ambulation/Gait Ambulation/Gait assistance: Min guard, Min assist Gait Distance (Feet): 120 Feet Assistive device: Rolling walker (2 wheels) Gait Pattern/deviations: Step-through pattern, Drifts right/left, Staggering left, Staggering right, Scissoring, Narrow base of support Gait velocity: decreased     General Gait Details: pt was able to ambulate ~ 120 ft with RW prior to c/o severe dizziness and needing seated rest.   Balance Overall balance assessment: Needs assistance Sitting-balance support: No upper extremity supported, Feet supported Sitting balance-Leahy Scale: Fair     Standing balance support: Bilateral upper extremity supported Standing balance-Leahy Scale: Poor      Cognition Arousal/Alertness: Lethargic Behavior During Therapy: Impulsive Overall Cognitive Status: No family/caregiver present to determine baseline cognitive functioning      General Comments: Pt was pleasantly confused. oriented to self only but does  consistently follow commands throughout.  18 units  insulin given since last BS reading               Pertinent Vitals/Pain Pain Assessment Pain Assessment: No/denies pain     PT Goals (current goals can now be found in the care plan section) Acute Rehab PT Goals Patient Stated Goal: none stated Progress towards PT goals: Progressing toward goals    Frequency    Min 2X/week      PT Plan Current plan remains appropriate       AM-PAC PT "6 Clicks" Mobility   Outcome Measure  Help needed turning from your back to your side while in a flat bed without using bedrails?: A Little Help needed moving from lying on your back to sitting on the side of a flat bed without using bedrails?: A Little Help needed moving to and from a bed to a chair (including a wheelchair)?: A Little Help needed standing up from a chair using your arms (e.g., wheelchair or bedside chair)?: A Little Help needed to walk in hospital room?: A Lot Help needed climbing 3-5 steps with a railing? : A Lot 6 Click Score: 16    End of Session   Activity Tolerance: Patient tolerated treatment well Patient left: in bed;with call bell/phone within reach;with bed alarm set Nurse Communication: Mobility status PT Visit Diagnosis: Muscle weakness (generalized) (M62.81);Difficulty in walking, not elsewhere classified (R26.2)     Time: 0263-7858 PT Time Calculation (min) (ACUTE ONLY): 14 min  Charges:  $Gait Training: 8-22 mins                     Julaine Fusi PTA 08/17/21, 4:36 PM

## 2021-08-17 NOTE — Progress Notes (Signed)
[place] [facility] [place] NAME: Brittany [name]    MR#:  [id number]  DATE OF BIRTH:  1960/09/04  SUBJECTIVE:   patient seen earlier. Sitting out in the chair. She appears calm and pleasant. Eating her breakfast. Sugars stable. No family at bedside.   VITALS:  Blood pressure (!) 131/99, pulse 79, temperature 97.7 F (36.5 C), temperature source Oral, resp. rate 18, weight 44.1 kg, SpO2 98 %.  PHYSICAL EXAMINATION:   GENERAL:  61 y.o.-year-old patient lying in the bed with no acute distress.  LUNGS: Normal breath sounds bilaterally, no wheezing CARDIOVASCULAR: S1, S2 normal. No murmurs  ABDOMEN: Soft, nontender, nondistended. Bowel sounds present.  EXTREMITIES: No  edema b/l.    NEUROLOGIC: nonfocal  patient is alert and awake SKIN: No obvious rash, lesion, or ulcer.   LABORATORY PANEL:  CBC Recent Labs  Lab 08/17/21 0358  WBC 5.6  HGB 10.6*  HCT 33.0*  PLT 328    Chemistries  Recent Labs  Lab 08/13/21 1704 08/13/21 2040 08/17/21 0358  NA 136   < > 136  K 3.4*   < > 4.5  CL 102   < > 99  CO2 12*   < > 27  GLUCOSE 450*   < > 204*  BUN 26*   < > 10  CREATININE 1.19*   < > 0.75  CALCIUM 8.4*   < > 9.1  MG 1.5*   < > 2.4  AST 24  --   --   ALT 27  --   --   ALKPHOS 101  --   --   BILITOT 1.9*  --   --    < > = values in this interval not displayed.    Assessment and Plan  [name], is a 61 year old female with history of hypertension, poorly controlled insulin-dependent diabetes mellitus, history of left PCA territory infarct, right hemiparesis, COPD, history of pancreectomy/splenectomy, history of diverticulitis with abscess leading to colostomy in January 2023, anxiety, depression, who presented to [facility] ER via EMS on 07/2 with altered mental status.  Pts daughter reported to EMS the pt was "fine" at 08:30 am today, however she later found her on the floor prompting EMS notification.   DKA (diabetic ketoacidosis)  ([place]) Labile DM-1 Patient admitted with DKA which has been resolved.  -- Insulin infusion has been switched with basal and short-acting. -Continue with Semglee,SSI and Aspart --diabetes coordinator following   Severe sepsis ([place]) --Patient presented with severe sepsis and septic shock secondary to pneumonia.  Initially required pressors due to insufficient response to IV fluid.  Now off the pressors and blood pressure mildly elevated. --Received broad-spectrum antibiotics and IV fluid per sepsis protocol. --Antibiotics were switched with Unasyn for concern of aspiration pneumonia--change to po aurmentin   Essential hypertension Patient was not on any antihypertensive medications at home. Blood pressure mildly elevated. 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  Being managed by oncology as an outpatient. -Continue with Creon   HLD (hyperlipidemia) - Continue with home Lipitor   Depression with anxiety - Restart home Zyprexa and mirtazapine   Protein-calorie malnutrition, severe Estimated body mass index is 17.22 kg/m as calculated from the following:   Height as of 07/26/21: '5\' 3"'$  (1.6 m).   Weight as  of this encounter: 44.1 kg.   -Dietitian consult    Procedures:none Family communication :spoke with mother today Consults :PCCM CODE STATUS: full DVT Prophylaxis :enoxaparin Level of care: Med-Surg Status is: Inpatient Remains inpatient appropriate because: medically improving TOC for d/c planning to rehab Spoke with mother today    TOTAL TIME TAKING CARE OF THIS PATIENT: 35 minutes.  >50% time spent on counselling and coordination of care  Note: This dictation was prepared with Dragon dictation along with smaller phrase technology. Any transcriptional errors that result from  this process are unintentional.  Fritzi Mandes M.D    [facility]   CC: Primary care physician; Pcp, No

## 2021-08-17 NOTE — Plan of Care (Signed)
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
## 2021-08-17 NOTE — TOC Progression Note (Signed)
Transition of Care [facility]) - Progression Note    Patient Details  Name: Brittany [name] MRN: [id number] Date of Birth: 02/07/1961  [facility] CM/SW [place], LCSW Phone Number: 08/17/2021, 9:56 AM  Clinical Narrative:  New phone number for daughter put in chart so called and spoke with her. She is agreeable to SNF placement. No facility preference other than being in [facility] because she does not have transportation. Will follow up once bed offers available.   Expected Discharge Plan: [place] Barriers to Discharge: Continued Medical Work up  Expected Discharge Plan and Services Expected Discharge Plan: [place] arrangements for the past 2 months: Single Family Home                                       Social Determinants of Health (SDOH) Interventions    Readmission Risk Interventions    03/06/2021    9:13 AM 02/16/2021    2:45 PM  Readmission Risk Prevention Plan  Transportation Screening Complete Complete  Medication Review ([place]) Complete Complete  PCP or Specialist appointment within 3-5 days of discharge Complete   SW Recovery Care/Counseling Consult  Complete  Palliative Care Screening Not Applicable Not [place] Not Applicable Not Applicable

## 2021-08-17 NOTE — Consult Note (Signed)
[place] for Electrolyte Monitoring and Replacement   Recent Labs: Potassium (mmol/L)  Date Value  08/17/2021 4.5  08/17/2011 3.6   Magnesium (mg/dL)  Date Value  08/17/2021 2.4   Calcium (mg/dL)  Date Value  08/17/2021 9.1   Calcium, Total (mg/dL)  Date Value  08/17/2011 9.7   Albumin (g/dL)  Date Value  08/13/2021 2.3 (L)  04/04/2011 4.5   Phosphorus (mg/dL)  Date Value  08/17/2021 3.3   Sodium (mmol/L)  Date Value  08/17/2021 136  08/17/2011 139   Assessment: Brittany [name] is a 61 y.o. female w/ PMH of DM, HLD, HTN, RLS, Pancreatic Cancer s/p Distal pancreatectomy and splenectomy, recent PNA, CVA, dysphagia admitted on 08/13/2021 with sepsis & DKA. Started on insulin gtt. Pharmacy has been consulted for electrolyte mgmt.  MIVF: LR at 50 cc/hr  Goal of Therapy:  Electrolytes within normal limits  Plan:  --no electrolyte replacement warranted for today --Follow-up electrolytes with AM labs tomorrow  Dallie Piles 08/17/2021 7:03 AM

## 2021-08-17 NOTE — Inpatient Diabetes Management (Signed)
Inpatient Diabetes Program Recommendations  AACE/ADA: New Consensus Statement on Inpatient Glycemic Control   Target Ranges:  Prepandial:   less than 140 mg/dL      [facility] postprandial:   less than 180 mg/dL (1-2 hours)      Critically ill patients:  140 - 180 mg/dL    Latest Reference Range & Units 08/16/21 08:48 08/16/21 11:58 08/16/21 16:01 08/16/21 19:41 08/16/21 22:30 08/17/21 00:25 08/17/21 03:45  Glucose-Capillary 70 - 99 mg/dL 126 (H) 298 (H) 342 (H) 319 (H) 232 (H) 187 (H) 212 (H)    Review of Glycemic Control  Diabetes history: DM1 Outpatient Diabetes medications: Lantus 10 units QHS, Humalog 5 units TID with meals Current orders for Inpatient glycemic control: Semglee 10 units Q24H, Novolog 0-9  units 0-9 units TID, Novolog 0-5 units QHS  Inpatient Diabetes Program Recommendations:    Insulin: Please consider ordering Novolog 3 units TID with meals for meal coverage if patient eats at least 50% of meals.  Thanks, Barnie Alderman, RN, MSN, [place] Diabetes Coordinator Inpatient Diabetes Program [phone] (Team Pager from 8am to [place])

## 2021-08-17 NOTE — Progress Notes (Signed)
OT Cancellation Note  Patient Details Name: Brittany [name] MRN: [id number] DOB: [birth year]   Cancelled Treatment:    Reason Eval/Treat Not Completed: Fatigue/lethargy limiting ability to participate. Upon attempt this afternoon, pt sleeping soundly, requires verbal and tactile cues to alert briefly. Pt opens her eyes slightly then returns to sleep, not engaging with OT. Will re-attempt at later date/time as pt is able to participate.   Ardeth Perfect., MPH, MS, OTR/L ascom [phone] 08/17/21, 2:54 PM

## 2021-08-17 NOTE — Care Management Important Message (Signed)
Important Message  Patient Details  Name: Brittany [name] MRN: [id number] Date of Birth: 1960-05-19   Medicare Important Message Given:  N/A - LOS <3 / Initial given by admissions     [name] 08/17/2021, 10:36 AM

## 2021-08-18 DIAGNOSIS — E1110 Type 2 diabetes mellitus with ketoacidosis without coma: Secondary | ICD-10-CM | POA: Diagnosis not present

## 2021-08-18 LAB — CULTURE, BLOOD (ROUTINE X 2)
Culture: NO GROWTH
Special Requests: ADEQUATE

## 2021-08-18 LAB — BASIC METABOLIC PANEL
Anion gap: 5 (ref 5–15)
BUN: 13 mg/dL (ref 8–23)
CO2: 28 mmol/L (ref 22–32)
Calcium: 9 mg/dL (ref 8.9–10.3)
Chloride: 99 mmol/L (ref 98–111)
Creatinine, Ser: 0.79 mg/dL (ref 0.44–1.00)
GFR, Estimated: >60 mL/min (ref >60)
Glucose, Bld: 423 mg/dL — ABNORMAL HIGH (ref 70–99)
Potassium: 5 mmol/L (ref 3.5–5.1)
Sodium: 132 mmol/L — ABNORMAL LOW (ref 135–145)

## 2021-08-18 LAB — GLUCOSE, CAPILLARY
Glucose-Capillary: 438 mg/dL — ABNORMAL HIGH (ref 70–99)
Glucose-Capillary: 299 mg/dL — ABNORMAL HIGH (ref 70–99)
Glucose-Capillary: 222 mg/dL — ABNORMAL HIGH (ref 70–99)
Glucose-Capillary: 89 mg/dL (ref 70–99)
Glucose-Capillary: 108 mg/dL — ABNORMAL HIGH (ref 70–99)
Glucose-Capillary: 51 mg/dL — ABNORMAL LOW (ref 70–99)
Glucose-Capillary: 162 mg/dL — ABNORMAL HIGH (ref 70–99)
Glucose-Capillary: 134 mg/dL — ABNORMAL HIGH (ref 70–99)

## 2021-08-18 MED ORDER — INSULIN ASPART 100 UNIT/ML IJ SOLN
6.0000 [IU] | Freq: Once | INTRAMUSCULAR | Status: AC
  Administered 2021-08-18: 6 [IU] via SUBCUTANEOUS
  Filled 2021-08-18: qty 1

## 2021-08-18 MED ORDER — FAMOTIDINE 20 MG PO TABS
20.0000 mg | ORAL_TABLET | Freq: Every day | ORAL | Status: DC
  Administered 2021-08-18 – 2021-08-28 (×11): 20 mg via ORAL
  Filled 2021-08-18 (×11): qty 1

## 2021-08-18 MED ORDER — INSULIN ASPART 100 UNIT/ML IJ SOLN
5.0000 [IU] | Freq: Three times a day (TID) | INTRAMUSCULAR | Status: DC
Start: 2021-08-18 — End: 2021-08-19
  Administered 2021-08-18 – 2021-08-19 (×4): 5 [IU] via SUBCUTANEOUS
  Filled 2021-08-18 (×4): qty 1

## 2021-08-18 MED ORDER — INSULIN GLARGINE-YFGN 100 UNIT/ML ~~LOC~~ SOLN
15.0000 [IU] | Freq: Every day | SUBCUTANEOUS | Status: DC
  Administered 2021-08-19 – 2021-08-20 (×2): 15 [IU] via SUBCUTANEOUS
  Filled 2021-08-18 (×2): qty 0.15

## 2021-08-18 NOTE — Care Management Important Message (Signed)
Important Message  Patient Details  Name: Brittany [name] MRN: [id number] Date of Birth: 08/30/1960   Medicare Important Message Given:  Yes     Juliann Pulse A Nthony Lefferts 08/18/2021, 2:47 PM

## 2021-08-18 NOTE — Progress Notes (Signed)
[place] [facility] [place] NAME: Brittany [name]    MR#:  [id number]  DATE OF BIRTH:  Apr 13, 1960  SUBJECTIVE:   patient seen earlier. Sitting out in the chair. She appears calm and pleasant.  Sugars high. Per RN she is getting extra Trays of food!! No family at bedside.   VITALS:  Blood pressure (!) 154/99, pulse 89, temperature 98.1 F (36.7 C), resp. rate 16, weight 45 kg, SpO2 98 %.  PHYSICAL EXAMINATION:   GENERAL:  61 y.o.-year-old patient lying in the bed with no acute distress.  LUNGS: Normal breath sounds bilaterally, no wheezing CARDIOVASCULAR: S1, S2 normal. No murmurs  ABDOMEN: Soft, nontender, nondistended. Bowel sounds present.  EXTREMITIES: No  edema b/l.    NEUROLOGIC: nonfocal  patient is alert and awake SKIN: No obvious rash, lesion, or ulcer.   LABORATORY PANEL:  CBC Recent Labs  Lab 08/17/21 0358  WBC 5.6  HGB 10.6*  HCT 33.0*  PLT 328     Chemistries  Recent Labs  Lab 08/13/21 1704 08/13/21 2040 08/17/21 0358 08/18/21 0518  NA 136   < > 136 132*  K 3.4*   < > 4.5 5.0  CL 102   < > 99 99  CO2 12*   < > 27 28  GLUCOSE 450*   < > 204* 423*  BUN 26*   < > 10 13  CREATININE 1.19*   < > 0.75 0.79  CALCIUM 8.4*   < > 9.1 9.0  MG 1.5*   < > 2.4  --   AST 24  --   --   --   ALT 27  --   --   --   ALKPHOS 101  --   --   --   BILITOT 1.9*  --   --   --    < > = values in this interval not displayed.     Assessment and Plan  Ms. Rosia Syme, is a 61 year old female with history of hypertension, poorly controlled insulin-dependent diabetes mellitus, history of left PCA territory infarct, right hemiparesis, COPD, history of pancreectomy/splenectomy, history of diverticulitis with abscess leading to colostomy in January 2023, anxiety, depression, who presented to [facility] ER via EMS on 07/2 with altered mental status.  Pts daughter reported to EMS the pt was "fine" at 08:30 am today, however she later found her on  the floor prompting EMS notification.   DKA (diabetic ketoacidosis) ([place]) Labile DM-1 Patient admitted with DKA which has been resolved.  -- Insulin infusion has been switched with basal and short-acting. -Continue with Semglee,SSI and Aspart --diabetes coordinator following --d/w RN not to give extra trays of food--ive snack in between meals   Severe sepsis ([place]) --Patient presented with severe sepsis and septic shock secondary to pneumonia.  Initially required pressors due to insufficient response to IV fluid.  Now off the pressors and blood pressure mildly elevated. --Received broad-spectrum antibiotics and IV fluid per sepsis protocol. --Antibiotics were switched with Unasyn for concern of aspiration pneumonia--change to po augrmentin --sepsis resolved   Essential hypertension -Patient was not on any antihypertensive medications at home. Blood pressure mildly elevated. 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  Being managed by oncology as an outpatient. -Continue with Creon   HLD (hyperlipidemia) -  Continue with home Lipitor   Depression with anxiety - Restart home Zyprexa and mirtazapine   Protein-calorie malnutrition, severe Estimated body mass index is 17.22 kg/m as calculated from the following:   Height as of 07/26/21: '5\' 3"'$  (1.6 m).   Weight as of this encounter: 44.1 kg.   -Dietitian consult appreciated    Procedures:none Family communication :spoke with mother  Consults :PCCM CODE STATUS: full DVT Prophylaxis :enoxaparin Level of care: Med-Surg Status is: Inpatient Remains inpatient appropriate because: medically improving TOC for d/c planning to rehab once bed available Spoke with mother today.  Per TOC pt's dter is aware of plans    TOTAL TIME TAKING CARE OF  THIS PATIENT: 35 minutes.  >50% time spent on counselling and coordination of care  Note: This dictation was prepared with Dragon dictation along with smaller phrase technology. Any transcriptional errors that result from this process are unintentional.  Fritzi Mandes M.D    [facility]   CC: Primary care physician; Pcp, No

## 2021-08-18 NOTE — Consult Note (Signed)
[employer]ly[employer] Monitoring and Replacement   Recent Labs: Potassium (mmol/L)  Date Value  08/18/2021 5.0  08/17/2011 3.6   Magnesium (mg/dL)  Date Value  08/17/2021 2.4   Calcium (mg/dL)  Date Value  08/18/2021 9.0   Calcium, Total (mg/dL)  Date Value  08/17/2011 9.7   Albumin (g/dL)  Date Value  08/13/2021 2.3 (L)  04/04/2011 4.5   Phosphorus (mg/dL)  Date Value  08/17/2021 3.3   Sodium (mmol/L)  Date Value  08/18/2021 132 (L)  08/17/2011 139   Assessment: Brittany [name] is a 61 y.o. female w/ PMH of DM, HLD, HTN, RLS, Pancreatic Cancer s/p Distal pancreatectomy and splenectomy, recent PNA, CVA, dysphagia admitted on 08/13/2021 with sepsis & DKA. Started on insulin gtt. Pharmacy has been consulted for electrolyte mgmt.  MIVF: LR at 50 cc/hr  Goal of Therapy:  Electrolytes within normal limits  Plan:  no electrolyte replacement warranted for today Because this consult was generated as part of an ICU order set and patient is transferring pharmacy will sign off for now Please feel free to reach out if any further assistance is needed   Dallie Piles 08/18/2021 7:02 AM

## 2021-08-18 NOTE — TOC Progression Note (Addendum)
Transition of Care [facility]) - Progression Note    Patient Details  Name: LORRIANE DEHART MRN: [id number] Date of Birth: 1960/11/02  Transition of Care [facility]) CM/SW [place], LCSW Phone Number: 08/18/2021, 9:15 AM  Clinical Narrative:  Left voicemail for daughter. Will give bed offers once she calls back.   12:14 pm: Daughter has accepted bed offer from [facility]. Left message for admissions coordinator to see if they would have a bed once auth obtained.  2:40 pm: Per Magda Paganini with [place], they are going to pursue guardianship and likely assign daughter. Daughter interested in [place] SNF placement after rehab. [facility] admissions coordinator is aware. Starting insurance authorization through [employer] portal.  Expected Discharge Plan: [place] Barriers to Discharge: Continued Medical Work up  Expected Discharge Plan and Services Expected Discharge Plan: [place] arrangements for the past 2 months: Single Family Home                                       Social Determinants of Health (SDOH) Interventions    Readmission Risk Interventions    03/06/2021    9:13 AM 02/16/2021    2:45 PM  Readmission Risk Prevention Plan  Transportation Screening Complete Complete  Medication Review ([place]) Complete Complete  PCP or Specialist appointment within 3-5 days of discharge Complete   SW Recovery Care/Counseling Consult  Complete  Palliative Care Screening Not Applicable Not [place] Not Applicable Not Applicable

## 2021-08-18 NOTE — Progress Notes (Signed)
Nutrition Follow-up  DOCUMENTATION CODES:   Severe malnutrition in context of chronic illness  INTERVENTION:   -Continue MVI with minerals daily -Continue Glucerna Shake po TID, each supplement provides 220 kcal and 10 grams of protein  -Double protein portions with meals  NUTRITION DIAGNOSIS:   Severe Malnutrition related to chronic illness (COPD, pancreatic cancer) as evidenced by severe fat depletion, severe muscle depletion.  Ongoing  GOAL:   Patient will meet greater than or equal to 90% of their needs  Progressing   MONITOR:   PO intake, Supplement acceptance  REASON FOR ASSESSMENT:   Rounds    ASSESSMENT:   61 y.o. 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  7/3- s/p bedside swallow screen, advanced to carb modified diet  Reviewed I/O's: -661 ml x 24 hours and +4.1 L since admission  UOP: 225 ml x 24 hours  Pt sleeping soundly at time of visit. She did not arouse to voice or touch.   Pt with improved oral intake. Noted meal completions 80-100%. Pt also drinking Glucerna supplements.   Case discussed with MD, who is concerned about hyperglycemia. Per MD, pt has been receiving additional trays, which MD has ordered not to occur. Pt also receiving Glucerna. RD will also add double protein portions to meals to help with satiety. DM coordinator has also adjusted pt's insulin regimen.   Per TOC notes, plan to d/c to [facility] once insurance  authorization has been obtained.   Medications reviewed and include creon, remeron,   Labs reviewed: Na: 132, CBGS: 89-438 (inpatient orders for glycemic control are 0-15 units insulin aspart TID with meals, 0-5 units insulin aspart daily at bedtime, 5 units insulin aspart TID, and 15 units insulin glargine-yfgn daily).    Diet Order:   Diet Order             Diet Carb Modified Fluid consistency: Thin; Room service appropriate? Yes  Diet effective now                   EDUCATION NEEDS:   Education needs have been addressed  Skin:  Skin Assessment: Reviewed RN Assessment  Last BM:  08/16/21 (325 ml via ileostomy)  Height:   Ht Readings from Last 1 Encounters:  07/26/21 '5\' 3"'$  (1.6 m)    Weight:   Wt Readings from Last 1 Encounters:  08/18/21 45 kg    Ideal Body Weight:  52.3 kg  BMI:  Body mass index is 17.57 kg/m.  Estimated Nutritional Needs:   Kcal:  1400-1600kcal/day  Protein:  70-80g/day  Fluid:  1.3-1.5L/day    Loistine Chance, RD, LDN, [place] Registered Dietitian II Certified Diabetes Care and Education Specialist Please refer to [facility] for RD and/or RD on-call/weekend/after hours pager

## 2021-08-18 NOTE — Progress Notes (Signed)
OT Cancellation Note  Patient Details Name: Brittany [name] MRN: [id number] DOB: 04-Feb-1961   Cancelled Treatment:    Reason Eval/Treat Not Completed: Fatigue/lethargy limiting ability to participate. Pt sleeping soundly, does not rouse to verbal or tactile cues. Will re-attempt OT tx at later date/time as pt is able to tolerate.   Ardeth Perfect., MPH, MS, OTR/L ascom [phone] 08/18/21, 3:34 PM

## 2021-08-18 NOTE — Progress Notes (Signed)
   08/17/21 2309  Mobility  HOB Elevated/Bed Position Self regulated  Activity Ambulated independently to bathroom;Ambulated independently in room  Range of Motion/Exercises Active;All extremities  Level of Assistance Standby assist, set-up cues, supervision of patient - no hands on  Assistive Device BSC  Distance Ambulated (ft) 15 ft  Activity Response Tolerated well

## 2021-08-18 NOTE — Progress Notes (Signed)
Physical Therapy Treatment Patient Details Name: Brittany [name] MRN: [id number] DOB: 05-10-1960 Today's Date: 08/18/2021   History of Present Illness Pt. is a 61 y.o. F presenting to [facility] on 7/2 with AMS. Admitted to SDU with acute metabolic encephalopathy, severe sepsis with septic shock secondary to pneumonia likely aspiration, hypothermia, and DKA. 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       Recommendations for follow up therapy are one component of a multi-disciplinary discharge planning process, led by the attending physician.  Recommendations may be updated based on patient status, additional functional criteria and insurance authorization.  Follow Up Recommendations  Skilled nursing-short term rehab (<3 hours/day)     Assistance Recommended at Discharge Frequent or constant Supervision/Assistance  Patient can return home with the following A little help with walking and/or transfers;A little help with bathing/dressing/bathroom;Direct supervision/assist for medications management;Direct supervision/assist for financial management;Assist for transportation;Help with stairs or ramp for entrance   Equipment Recommendations  Rolling walker (2 wheels)       Precautions / Restrictions Precautions Precautions: Fall Precaution Comments: colostomy Restrictions Weight Bearing Restrictions: No     Mobility  Bed Mobility    General bed mobility comments: pt was in recliner pre/post session    Transfers Overall transfer level: Needs assistance Equipment used: Rolling walker (2 wheels) Transfers: Sit to/from Stand Sit to Stand: Min guard    General transfer comment: CGA for safety due to impulsivity and lack of insight of current deficits    Ambulation/Gait Ambulation/Gait assistance: Min  guard, Min assist Gait Distance (Feet): 100 Feet Assistive device: Rolling walker (2 wheels) Gait Pattern/deviations: Step-through pattern, Drifts right/left, Staggering left, Staggering right, Scissoring, Narrow base of support       General Gait Details: pt ambulated 100 ft with RW with mostly CGA but did require min assist during turns   Balance Overall balance assessment: Needs assistance Sitting-balance support: No upper extremity supported, Feet supported Sitting balance-Leahy Scale: Fair     Standing balance support: Bilateral upper extremity supported Standing balance-Leahy Scale: Poor Standing balance comment: high fall risk due to impulsivity and poor safety awareness      Cognition Arousal/Alertness: Awake/alert Behavior During Therapy: Impulsive Overall Cognitive Status: Within Functional Limits for tasks assessed        General Comments: Pt was pleasantly confused. Oriented to self and being in hospital but fixated on getting more food               Pertinent Vitals/Pain Pain Assessment Pain Assessment: No/denies pain Pain Score: 0-No pain     PT Goals (current goals can now be found in the care plan section) Acute Rehab PT Goals Patient Stated Goal: none stated Progress towards PT goals: Progressing toward goals    Frequency    Min 2X/week      PT Plan Current plan remains appropriate       AM-PAC PT "6 Clicks" Mobility   Outcome Measure  Help needed turning from your back to your side while in a flat bed without using bedrails?: A Little Help needed moving from lying on your back to sitting on the side of a flat bed without using bedrails?: A Little Help needed moving to and from a bed to a chair (including a wheelchair)?: A Little Help needed standing up from a chair using your arms (e.g., wheelchair or bedside chair)?: A  Little Help needed to walk in hospital room?: A Little Help needed climbing 3-5 steps with a railing? : A Little 6  Click Score: 18    End of Session   Activity Tolerance: Patient tolerated treatment well Patient left: in chair;with call bell/phone within reach;with chair alarm set Nurse Communication: Mobility status PT Visit Diagnosis: Muscle weakness (generalized) (M62.81);Difficulty in walking, not elsewhere classified (R26.2)     Time: 7519-8242 PT Time Calculation (min) (ACUTE ONLY): 12 min  Charges:  $Gait Training: 8-22 mins                     Julaine Fusi PTA 08/18/21, 10:14 AM

## 2021-08-18 NOTE — Progress Notes (Signed)
PHARMACIST - PHYSICIAN COMMUNICATION  DR:   Posey Pronto  CONCERNING: IV to Oral Route Change Policy  RECOMMENDATION: This patient is receiving famotidine by the intravenous route.  Based on criteria approved by the Pharmacy and Therapeutics Committee, the intravenous medication(s) is/are being converted to the equivalent oral dose form(s).   DESCRIPTION: These criteria include: The patient is eating (either orally or via tube) and/or has been taking other orally administered medications for a least 24 hours The patient has no evidence of active gastrointestinal bleeding or impaired GI absorption (gastrectomy, short bowel, patient on TNA or NPO).  If you have questions about this conversion, please contact the Pharmacy Department  '[]'$   [phone] )  [facility] '[x]'$   [phone] )  [facility] '[]'$   [phone] )  [facility] '[]'$   [phone] )  [facility] '[]'$   [phone] )  [place], [facility] 08/18/2021 2:10 PM

## 2021-08-18 NOTE — Inpatient Diabetes Management (Signed)
Inpatient Diabetes Program Recommendations  AACE/ADA: New Consensus Statement on Inpatient Glycemic Control   Target Ranges:  Prepandial:   less than 140 mg/dL      [facility] postprandial:   less than 180 mg/dL (1-2 hours)      Critically ill patients:  140 - 180 mg/dL    Latest Reference Range & Units 08/18/21 04:36 08/18/21 06:20  Glucose-Capillary 70 - 99 mg/dL 438 (H) 299 (H)    Latest Reference Range & Units 08/17/21 08:22 08/17/21 11:56 08/17/21 12:17 08/17/21 16:38 08/17/21 21:03  Glucose-Capillary 70 - 99 mg/dL 294 (H) 406 (H) 462 (H) 132 (H) 248 (H)   Review of Glycemic Control  Diabetes history: DM1 Outpatient Diabetes medications: Lantus 10 units QHS, Humalog 5 units TID with meals Current orders for Inpatient glycemic control: Semglee 10 units Q24H, Novolog 0-15 units TID, Novolog 0-5 units QHS, Novolog 3 units TID with meals  Inpatient Diabetes Program Recommendations:    Insulin: Please consider increasing Semglee to 15 units QHS and meal coverage to Novolog 5 units TID with meals. Also, please consider decreasing Novolog correction to 0-9 units TID with meals.  Thanks, Barnie Alderman, RN, MSN, [place] Diabetes Coordinator Inpatient Diabetes Program [phone] (Team Pager from 8am to [place])

## 2021-08-19 DIAGNOSIS — D490 Neoplasm of unspecified behavior of digestive system: Secondary | ICD-10-CM

## 2021-08-19 DIAGNOSIS — F418 Other specified anxiety disorders: Secondary | ICD-10-CM | POA: Diagnosis not present

## 2021-08-19 DIAGNOSIS — R6520 Severe sepsis without septic shock: Secondary | ICD-10-CM

## 2021-08-19 DIAGNOSIS — E1110 Type 2 diabetes mellitus with ketoacidosis without coma: Secondary | ICD-10-CM | POA: Diagnosis not present

## 2021-08-19 DIAGNOSIS — I10 Essential (primary) hypertension: Secondary | ICD-10-CM | POA: Diagnosis not present

## 2021-08-19 DIAGNOSIS — Z8673 Personal history of transient ischemic attack (TIA), and cerebral infarction without residual deficits: Secondary | ICD-10-CM | POA: Diagnosis not present

## 2021-08-19 LAB — GLUCOSE, CAPILLARY
Glucose-Capillary: 246 mg/dL — ABNORMAL HIGH (ref 70–99)
Glucose-Capillary: 255 mg/dL — ABNORMAL HIGH (ref 70–99)
Glucose-Capillary: 286 mg/dL — ABNORMAL HIGH (ref 70–99)
Glucose-Capillary: 291 mg/dL — ABNORMAL HIGH (ref 70–99)
Glucose-Capillary: 342 mg/dL — ABNORMAL HIGH (ref 70–99)
Glucose-Capillary: 356 mg/dL — ABNORMAL HIGH (ref 70–99)
Glucose-Capillary: 71 mg/dL (ref 70–99)

## 2021-08-19 LAB — BASIC METABOLIC PANEL
Anion gap: 12 (ref 5–15)
BUN: 10 mg/dL (ref 8–23)
CO2: 24 mmol/L (ref 22–32)
Calcium: 9.4 mg/dL (ref 8.9–10.3)
Chloride: 96 mmol/L — ABNORMAL LOW (ref 98–111)
Creatinine, Ser: 0.77 mg/dL (ref 0.44–1.00)
GFR, Estimated: >60 mL/min (ref >60)
Glucose, Bld: 241 mg/dL — ABNORMAL HIGH (ref 70–99)
Potassium: 4.7 mmol/L (ref 3.5–5.1)
Sodium: 132 mmol/L — ABNORMAL LOW (ref 135–145)

## 2021-08-19 LAB — CULTURE, BLOOD (ROUTINE X 2)
Culture: NO GROWTH
Special Requests: ADEQUATE

## 2021-08-19 MED ORDER — LACTATED RINGERS IV SOLN: INTRAVENOUS | Status: DC

## 2021-08-19 MED ORDER — SODIUM CHLORIDE 0.9 % IV BOLUS
500.0000 mL | Freq: Once | INTRAVENOUS | Status: AC
Start: 2021-08-19 — End: 2021-08-19
  Administered 2021-08-19: 500 mL via INTRAVENOUS

## 2021-08-19 MED ORDER — INSULIN ASPART 100 UNIT/ML IJ SOLN
8.0000 [IU] | Freq: Three times a day (TID) | INTRAMUSCULAR | Status: DC
  Administered 2021-08-20 – 2021-08-21 (×5): 8 [IU] via SUBCUTANEOUS
  Filled 2021-08-19 (×5): qty 1

## 2021-08-19 MED ORDER — MIDODRINE HCL 5 MG PO TABS
2.5000 mg | ORAL_TABLET | Freq: Three times a day (TID) | ORAL | Status: DC
  Administered 2021-08-19 – 2021-08-22 (×9): 2.5 mg via ORAL
  Filled 2021-08-19 (×9): qty 1

## 2021-08-19 NOTE — Inpatient Diabetes Management (Addendum)
Inpatient Diabetes Program Recommendations  AACE/ADA: New Consensus Statement on Inpatient Glycemic Control (2015)  Target Ranges:  Prepandial:   less than 140 mg/dL      [facility] postprandial:   less than 180 mg/dL (1-2 hours)      Critically ill patients:  140 - 180 mg/dL   Lab Results  Component Value Date   GLUCAP 286 (H) 08/19/2021   HGBA1C >15.5 (H) 08/13/2021    Latest Reference Range & Units 08/18/21 12:01 08/18/21 12:45 08/18/21 13:25 08/18/21 16:45 08/18/21 21:24 08/19/21 06:22 08/19/21 07:40  Glucose-Capillary 70 - 99 mg/dL 89 51 (L) 108 (H) 162 (H) 134 (H) 342 (H) 286 (H)  (L): Data is abnormally low (H): Data is abnormally high Review of Glycemic Control  Diabetes history: Type 1 Outpatient Diabetes medications: Lantus 10 units at HS, Novolog 5 units TID Current orders for Inpatient glycemic control: Semglee 15 units daily, Novolog 0-15 units correction scale TID, Novolog 5 units TID  Inpatient Diabetes Program Recommendations:   Noted that patient had a low CBG of 51 mg/dl at 1245 yesterday. Semglee was increased to 15 units daily and was not given until 6 am this morning. Fasting CBG was up to 342-286 mg/dl.   Recommend decreasing Novolog correction scale to SENSITIVE (0-9 units) TID since Semglee was increased and Novolog meal coverage was increased. Titrate dosages as needed.  Harvel Ricks RN BSN CDE Diabetes Coordinator Pager: [phone]  8am-5pm

## 2021-08-19 NOTE — Progress Notes (Signed)
PROGRESS NOTE    Brittany [name]  KGM:[id number] DOB: 27-Mar-1960 DOA: 08/13/2021 PCP: Pcp, No    Brief Narrative:  Taken from prior notes.  ICU transfer.   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  Pts daughter reported to EMS the pt was "fine" at 08:30 am today, however she later found her on the floor prompting EMS notification.  Per EMS run sheet upon their arrival at pts home she was found pale, cold, and clammy.  Her CBG readings were 442 and EMS was unable to obtain a temperature.  Pt placed on 6L nasal canula.  En route to the ER code sepsis initiated and pt received 500 ml iv fluid bolus. Pt remained altered and placed on 15L NRB.  She was recently hospitalized between 06/14 to 06/16 following treatment of CAP and DKA.     ED Course In the ER pt alert but disoriented.  ED vital signs were: bp 74/64, hr 105, resp rate 13, pt hypothermic, and O2 sats were 100%.  Sepsis protocol initiated: pt received cefepime, flagyl, vancomycin, 2.5 LR fluid bolus.  Labs were obtained.   CMP: Na+ 131, CO2 <7, glucose 592, BUN 28, creatinine 1.38, anion gap not calculated, albumin 2.6, total bilirubin 1.6, lactic acid 3.7, and pct 0.11 CBC: WBC 13.0 and Hgb 11.0 CXR: Increased right lower lobe infiltrate, suspicious for pneumonia .   Admitted with PCCM for concern of DKA and sepsis with septic shock secondary to pneumonia. Patient initially received broad-spectrum antibiotics with Zosyn, vancomycin and Flagyl under sepsis protocol and then transitioned to Unasyn for concern of aspiration pneumonia. Patient also required pressors due to insufficient response to  IV fluid.   7/4: Patient now hemodynamically stable.  Off the pressors.  Insulin infusion was switched with basal and short-acting.  Care is being transitioned to [facility].  Mild hypokalemia and hypophosphatemia which is being repleted.   7/5: Remained hemodynamically stable.  Awaiting placement.  Ordered removal of central line. PT is recommending SNF-TOC is working on it    Consultants:  pccm  Procedures:   Antimicrobials:      Subjective: No sob, cp. When got up per nsg was dizzy. Sbp found in 90's.  Objective: Vitals:   08/18/21 0838 08/18/21 2024 08/19/21 0229 08/19/21 0738  BP: 119/86 101/85 (!) 124/95 117/90  Pulse: (!) 101 100 88 100  Resp: '16 16 17 16  '$ Temp: 98.9 F (37.2 C) 98.2 F (36.8 C) 98.3 F (36.8 C) 98.6 F (37 C)  TempSrc: Oral     SpO2: 99% 100% 100% 99%  Weight:        Intake/Output Summary (Last 24 hours) at 08/19/2021 0815 Last data filed at 08/18/2021 2046 Gross per 24 hour  Intake --  Output 2225 ml  Net -2225 ml   Filed Weights   08/13/21 1138 08/13/21 1626 08/18/21 0500  Weight: 42 kg 44.1 kg 45 kg    Examination: Calm, NAD Cta no w/r Reg s1/s2 no gallop Soft benign +bs No edema Aaoxox3  Mood and affect appropriate in current setting     Data Reviewed: I have personally reviewed following labs and imaging studies  CBC: Recent Labs  Lab 08/13/21 1145 08/14/21 0427 08/15/21 0335  08/16/21 0523 08/17/21 0358  WBC 13.0* 8.9 8.6 6.9 5.6  NEUTROABS 11.3* 6.7 5.7 3.0 2.1  HGB 11.0* 9.1* 9.2* 10.2* 10.6*  HCT 37.5 26.1* 26.9* 30.3* 33.0*  MCV 93.1 80.6 81.0 83.2 84.4  PLT 322 315 300 363 412   Basic Metabolic Panel: Recent Labs  Lab 08/13/21 1704 08/13/21 2040 08/14/21 0427 08/15/21 0335 08/16/21 0523 08/17/21 0358 08/18/21 0518  NA 136   < > 140 136 138 136 132*  K 3.4*   < > 3.7 3.3* 3.4* 4.5 5.0  CL 102   < > 106 104 100 99 99  CO2 12*   < > '26 27 30 27 28  '$ GLUCOSE 450*   < > 140* 107* 142* 204* 423*  BUN 26*   <  > 18 10 7* 10 13  CREATININE 1.19*   < > 0.83 0.70 0.53 0.75 0.79  CALCIUM 8.4*   < > 8.6* 8.2* 8.8* 9.1 9.0  MG 1.5*  --  2.3 1.9 1.8 2.4  --   PHOS 4.8*  --  1.7* 2.4* 2.6 3.3  --    < > = values in this interval not displayed.   GFR: Estimated Creatinine Clearance: 52.5 mL/min (by C-G formula based on SCr of 0.79 mg/dL). Liver Function Tests: Recent Labs  Lab 08/13/21 1145 08/13/21 1704  AST 24 24  ALT 30 27  ALKPHOS 118 101  BILITOT 1.6* 1.9*  PROT 6.9 6.3*  ALBUMIN 2.6* 2.3*   No results for input(s): "LIPASE", "AMYLASE" in the last 168 hours. No results for input(s): "AMMONIA" in the last 168 hours. Coagulation Profile: Recent Labs  Lab 08/13/21 1145  INR 1.3*   Cardiac Enzymes: Recent Labs  Lab 08/13/21 1145  CKTOTAL 680*   BNP (last 3 results) No results for input(s): "PROBNP" in the last 8760 hours. HbA1C: No results for input(s): "HGBA1C" in the last 72 hours. CBG: Recent Labs  Lab 08/18/21 1325 08/18/21 1645 08/18/21 2124 08/19/21 0622 08/19/21 0740  GLUCAP 108* 162* 134* 342* 286*   Lipid Profile: No results for input(s): "CHOL", "HDL", "LDLCALC", "TRIG", "CHOLHDL", "LDLDIRECT" in the last 72 hours. Thyroid Function Tests: No results for input(s): "TSH", "T4TOTAL", "FREET4", "T3FREE", "THYROIDAB" in the last 72 hours. Anemia Panel: No results for input(s): "VITAMINB12", "FOLATE", "FERRITIN", "TIBC", "IRON", "RETICCTPCT" in the last 72 hours. Sepsis Labs: Recent Labs  Lab 08/13/21 2040 08/14/21 0152 08/14/21 0427 08/14/21 1112  LATICACIDVEN 3.1* 3.9* 3.3* 1.8    Recent Results (from the past 240 hour(s))  Resp Panel by RT-PCR (Flu A&B, Covid) Anterior Nasal Swab     Status: None   Collection Time: 08/13/21 11:45 AM   Specimen: Anterior Nasal Swab  Result Value Ref Range Status   SARS Coronavirus 2 by RT PCR NEGATIVE NEGATIVE Final    Comment: (NOTE) SARS-CoV-2 target nucleic acids are NOT DETECTED.  The SARS-CoV-2 RNA is generally  detectable in upper respiratory specimens during the acute phase of infection. The lowest concentration of SARS-CoV-2 viral copies this assay can detect is 138 copies/mL. A negative result does not preclude SARS-Cov-2 infection and should not be used as the sole basis for treatment or other patient management decisions. A negative result may occur with  improper specimen collection/handling, submission of specimen other than nasopharyngeal swab, presence of viral mutation(s) within the areas targeted by this assay, and inadequate number of viral copies(<138 copies/mL). A negative result must be combined with clinical observations, patient history, and epidemiological information. The expected  result is Negative.  Fact Sheet for Patients:  EntrepreneurPulse.com.au  Fact Sheet for Healthcare Providers:  IncredibleEmployment.be  This test is no t yet approved or cleared by the [place] FDA and  has been authorized for detection and/or diagnosis of SARS-CoV-2 by FDA under an Emergency Use Authorization (EUA). This EUA will remain  in effect (meaning this test can be used) for the duration of the COVID-19 declaration under Section 564(b)(1) of the Act, 21 U.S.C.section 360bbb-3(b)(1), unless the authorization is terminated  or revoked sooner.       Influenza A by PCR NEGATIVE NEGATIVE Final   Influenza B by PCR NEGATIVE NEGATIVE Final    Comment: (NOTE) The Xpert Xpress SARS-CoV-2/FLU/RSV plus assay is intended as an aid in the diagnosis of influenza from Nasopharyngeal swab specimens and should not be used as a sole basis for treatment. Nasal washings and aspirates are unacceptable for Xpert Xpress SARS-CoV-2/FLU/RSV testing.  Fact Sheet for Patients: EntrepreneurPulse.com.au  Fact Sheet for Healthcare Providers: IncredibleEmployment.be  This test is not yet approved or cleared by the [place] FDA  and has been authorized for detection and/or diagnosis of SARS-CoV-2 by FDA under an Emergency Use Authorization (EUA). This EUA will remain in effect (meaning this test can be used) for the duration of the COVID-19 declaration under Section 564(b)(1) of the Act, 21 U.S.C. section 360bbb-3(b)(1), unless the authorization is terminated or revoked.  Performed at [facility], [address] [place], [place] [address]   Blood Culture (routine x 2)     Status: None   Collection Time: 08/13/21 11:45 AM   Specimen: BLOOD  Result Value Ref Range Status   Specimen Description BLOOD  Final   Special Requests   Final    BOTTLES DRAWN AEROBIC ONLY Blood Culture adequate volume   Culture   Final    NO GROWTH 5 DAYS Performed at [facility], [address], [place], [place] [address]    Report Status 08/18/2021 FINAL  Final  Urine Culture     Status: None   Collection Time: 08/13/21 11:45 AM   Specimen: In/Out Cath Urine  Result Value Ref Range Status   Specimen Description   Final    IN/OUT CATH URINE Performed at [facility], [address], [place], [place] [address]    Special Requests   Final    NONE Performed at [facility], [address], [place], [place] [address]    Culture   Final    NO GROWTH Performed at [facility], [place] [address], [place], [place] [address]    Report Status 08/14/2021 FINAL  Final  Gastrointestinal Panel by PCR , Stool     Status: None   Collection Time: 08/13/21  1:50 PM   Specimen: Stool  Result Value Ref Range Status   Campylobacter species NOT DETECTED NOT DETECTED Final   Plesimonas shigelloides NOT DETECTED NOT DETECTED Final   Salmonella species NOT DETECTED NOT DETECTED Final   Yersinia enterocolitica NOT DETECTED NOT DETECTED Final   Vibrio species NOT DETECTED NOT DETECTED Final   Vibrio cholerae NOT DETECTED NOT DETECTED Final   Enteroaggregative E coli (EAEC) NOT DETECTED NOT DETECTED  Final   Enteropathogenic E coli (EPEC) NOT DETECTED NOT DETECTED Final   Enterotoxigenic E coli (ETEC) NOT DETECTED NOT DETECTED Final   Shiga like toxin producing E coli (STEC) NOT DETECTED NOT DETECTED Final   Shigella/Enteroinvasive E coli (EIEC) NOT DETECTED NOT DETECTED Final  Cryptosporidium NOT DETECTED NOT DETECTED Final   Cyclospora cayetanensis NOT DETECTED NOT DETECTED Final   Entamoeba histolytica NOT DETECTED NOT DETECTED Final   Giardia lamblia NOT DETECTED NOT DETECTED Final   Adenovirus F40/41 NOT DETECTED NOT DETECTED Final   Astrovirus NOT DETECTED NOT DETECTED Final   Norovirus GI/GII NOT DETECTED NOT DETECTED Final   Rotavirus A NOT DETECTED NOT DETECTED Final   Sapovirus (I, II, IV, and V) NOT DETECTED NOT DETECTED Final    Comment: Performed at [facility], [address] [place], [place] [address]  C Difficile Quick Screen w PCR reflex     Status: None   Collection Time: 08/13/21  1:50 PM   Specimen: Stool  Result Value Ref Range Status   C Diff antigen NEGATIVE NEGATIVE Final   C Diff toxin NEGATIVE NEGATIVE Final   C Diff interpretation No C. difficile detected.  Final    Comment: Performed at [facility], [address] [place], [place] [address]  MRSA Next Gen by PCR, Nasal     Status: None   Collection Time: 08/13/21  5:30 PM   Specimen: Nasal Mucosa; Nasal Swab  Result Value Ref Range Status   MRSA by PCR Next Gen NOT DETECTED NOT DETECTED Final    Comment: (NOTE) The GeneXpert MRSA Assay (FDA approved for NASAL specimens only), is one component of a comprehensive MRSA colonization surveillance program. It is not intended to diagnose MRSA infection nor to guide or monitor treatment for MRSA infections. Test performance is not FDA approved in patients less than 59 years old. Performed at [facility], [address] [place], [place] [address]   Culture, blood (Routine X 2) w Reflex to ID Panel     Status: None    Collection Time: 08/14/21  1:52 AM   Specimen: BLOOD  Result Value Ref Range Status   Specimen Description BLOOD LEFT HAND  Final   Special Requests   Final    BOTTLES DRAWN AEROBIC AND ANAEROBIC Blood Culture adequate volume   Culture   Final    NO GROWTH 5 DAYS Performed at [facility], [address], [place], [place] [address]    Report Status 08/19/2021 FINAL  Final         Radiology Studies: No results found.      Scheduled Meds:  amoxicillin-clavulanate  1 tablet Oral Q12H   aspirin EC  81 mg Oral Daily   atorvastatin  40 mg Oral QHS   Chlorhexidine Gluconate Cloth  6 each Topical Daily   enoxaparin (LOVENOX) injection  30 mg Subcutaneous QHS   famotidine  20 mg Oral Daily   feeding supplement (GLUCERNA SHAKE)  237 mL Oral TID BM   insulin aspart  0-15 Units Subcutaneous TID WC   insulin aspart  0-5 Units Subcutaneous QHS   insulin aspart  5 Units Subcutaneous TID WC   insulin glargine-yfgn  15 Units Subcutaneous Q0600   lipase/protease/amylase  36,000 Units Oral TID WC   mirtazapine  15 mg Oral QHS   multivitamin with minerals  1 tablet Oral Daily   OLANZapine  7.5 mg Oral QHS   rOPINIRole  0.5 mg Oral QHS   Continuous Infusions:  sodium chloride 10 mL/hr at 08/15/21 1800   sodium chloride Stopped (08/14/21 0300)   lactated ringers Stopped (08/17/21 1500)    Assessment & Plan:   Principal Problem:   DKA (diabetic ketoacidosis) ([place]) Active Problems:   Severe sepsis ([place])  Essential hypertension   History of stroke   Restless leg syndrome   IPMN (intraductal papillary mucinous neoplasm)   HLD (hyperlipidemia)   Depression with anxiety   Protein-calorie malnutrition, severe   DKA (diabetic ketoacidosis) ([place]) Labile DM-1 Patient admitted with DKA which has been resolved.  -- Insulin infusion has been switched with basal and short-acting. -Continue with Semglee,SSI and Aspart --diabetes coordinator following --d/w RN not to give  extra trays of food--ive snack in between meals 7/8 bg mildly elevated today. Will increase novolog with meals 8units tid.   Severe sepsis [facility]) --Patient presented with severe sepsis and septic shock secondary to pneumonia.  Initially required pressors due to insufficient response to IV fluid.  Now off the pressors and blood pressure mildly elevated. --Received broad-spectrum antibiotics and IV fluid per sepsis protocol. --Antibiotics were switched with Unasyn for concern of aspiration pneumonia--change to po augrmentin --sepsis resolved 7/8 bp on low side, symptomatic, gets dizzy on standing. Will start LR for hydration   Essential hypertension -Patient was not on any antihypertensive medications at home. Blood pressure mildly elevated. -If remain elevated we will start her on low-dose losartan 7/8 bp on low side . Hold bp meds. 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  Being managed by oncology as an outpatient. -Continue with Creon   HLD (hyperlipidemia) - Continue with home Lipitor   Depression with anxiety - Restart home Zyprexa and mirtazapine   Protein-calorie malnutrition, severe Estimated body mass index is 17.22 kg/m as calculated from the following:   Height as of 07/26/21: '5\' 3"'$  (1.6 m).   Weight as of this encounter: 44.1 kg.   -Dietitian consult appreciated   DVT prophylaxis: Lovenox Code Status: Full Family Communication: None at bedside Disposition Plan: Home Status is: Inpatient Remains inpatient appropriate because: IV treatment, with low blood pressure if symptomatic        LOS: 6 days   Time spent: 35 minutes    Nolberto Hanlon, MD [facility] Pager [phone]  If 7PM-7AM, please contact night-coverage 08/19/2021, 8:15 AM

## 2021-08-20 DIAGNOSIS — Z8673 Personal history of transient ischemic attack (TIA), and cerebral infarction without residual deficits: Secondary | ICD-10-CM | POA: Diagnosis not present

## 2021-08-20 DIAGNOSIS — I10 Essential (primary) hypertension: Secondary | ICD-10-CM | POA: Diagnosis not present

## 2021-08-20 DIAGNOSIS — E1110 Type 2 diabetes mellitus with ketoacidosis without coma: Secondary | ICD-10-CM | POA: Diagnosis not present

## 2021-08-20 DIAGNOSIS — F418 Other specified anxiety disorders: Secondary | ICD-10-CM | POA: Diagnosis not present

## 2021-08-20 LAB — GLUCOSE, CAPILLARY
Glucose-Capillary: 366 mg/dL — ABNORMAL HIGH (ref 70–99)
Glucose-Capillary: 281 mg/dL — ABNORMAL HIGH (ref 70–99)
Glucose-Capillary: 314 mg/dL — ABNORMAL HIGH (ref 70–99)
Glucose-Capillary: 264 mg/dL — ABNORMAL HIGH (ref 70–99)
Glucose-Capillary: 264 mg/dL — ABNORMAL HIGH (ref 70–99)
Glucose-Capillary: 173 mg/dL — ABNORMAL HIGH (ref 70–99)

## 2021-08-20 MED ORDER — SODIUM CHLORIDE 0.9 % IV SOLN: INTRAVENOUS | Status: DC

## 2021-08-20 MED ORDER — INSULIN GLARGINE-YFGN 100 UNIT/ML ~~LOC~~ SOLN
20.0000 [IU] | Freq: Every day | SUBCUTANEOUS | Status: DC
  Administered 2021-08-21 – 2021-08-22 (×2): 20 [IU] via SUBCUTANEOUS
  Filled 2021-08-20 (×2): qty 0.2

## 2021-08-20 NOTE — Progress Notes (Signed)
PROGRESS NOTE    [name]  HWE:[id number] DOB: [birth year] DOA: 08/13/2021 PCP: Pcp, No    Brief Narrative:  Taken from prior notes.  ICU transfer.   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  Pts daughter reported to EMS the pt was "fine" at 08:30 am today, however she later found her on the floor prompting EMS notification.  Per EMS run sheet upon their arrival at pts home she was found pale, cold, and clammy.  Her CBG readings were 442 and EMS was unable to obtain a temperature.  Pt placed on 6L nasal canula.  En route to the ER code sepsis initiated and pt received 500 ml iv fluid bolus. Pt remained altered and placed on 15L NRB.  She was recently hospitalized between 06/14 to 06/16 following treatment of CAP and DKA.     ED Course In the ER pt alert but disoriented.  ED vital signs were: bp 74/64, hr 105, resp rate 13, pt hypothermic, and O2 sats were 100%.  Sepsis protocol initiated: pt received cefepime, flagyl, vancomycin, 2.5 LR fluid bolus.  Labs were obtained.   CMP: Na+ 131, CO2 <7, glucose 592, BUN 28, creatinine 1.38, anion gap not calculated, albumin 2.6, total bilirubin 1.6, lactic acid 3.7, and pct 0.11 CBC: WBC 13.0 and Hgb 11.0 CXR: Increased right lower lobe infiltrate, suspicious for pneumonia .   Admitted with PCCM for concern of DKA and sepsis with septic shock secondary to pneumonia. Patient initially received broad-spectrum antibiotics with Zosyn, vancomycin and Flagyl under sepsis protocol and then transitioned to Unasyn for concern of aspiration pneumonia. Patient also required pressors due to insufficient response to  IV fluid.   7/4: Patient now hemodynamically stable.  Off the pressors.  Insulin infusion was switched with basal and short-acting.  Care is being transitioned to [facility].  Mild hypokalemia and hypophosphatemia which is being repleted.   7/5: Remained hemodynamically stable.  Awaiting placement.  Ordered removal of central line. PT is recommending SNF-TOC is working on it    Consultants:  pccm  Procedures:   Antimicrobials:      Subjective: No sob, cp. When got up per nsg was dizzy. Sbp found in 90's.  Objective: Vitals:   08/19/21 2113 08/20/21 0402 08/20/21 0928 08/20/21 1317  BP: 110/76 (!) 112/91 114/71 (!) 108/41  Pulse: 92 92 82 98  Resp: '18 18 16 16  '$ Temp: 99.4 F (37.4 C) 97.9 F (36.6 C) 98.7 F (37.1 C) 98.5 F (36.9 C)  TempSrc:  Oral Oral   SpO2: 100% 99% 100% 99%  Weight:        Intake/Output Summary (Last 24 hours) at 08/20/2021 1344 Last data filed at 08/19/2021 2120 Gross per 24 hour  Intake 63.75 ml  Output 1000 ml  Net -936.25 ml   Filed Weights   08/13/21 1138 08/13/21 1626 08/18/21 0500  Weight: 42 kg 44.1 kg 45 kg    Examination: Calm, NAD Cta no w/r Reg s1/s2 no gallop Soft benign +bs No edema Aaoxox3  Mood and affect appropriate in current setting     Data Reviewed: I have personally reviewed following labs and imaging studies  CBC: Recent Labs  Lab 08/14/21 0427 08/15/21 0335 08/16/21  4128 08/17/21 0358  WBC 8.9 8.6 6.9 5.6  NEUTROABS 6.7 5.7 3.0 2.1  HGB 9.1* 9.2* 10.2* 10.6*  HCT 26.1* 26.9* 30.3* 33.0*  MCV 80.6 81.0 83.2 84.4  PLT 315 300 363 786   Basic Metabolic Panel: Recent Labs  Lab 08/13/21 1704 08/13/21 2040 08/14/21 0427 08/15/21 0335 08/16/21 0523 08/17/21 0358 08/18/21 0518 08/19/21 0905  NA 136   < > 140 136 138 136 132* 132*  K 3.4*   < > 3.7 3.3* 3.4* 4.5 5.0 4.7  CL 102   < > 106 104 100 99 99 96*  CO2 12*   < > '26 27 30 27 28 24  '$ GLUCOSE 450*   < > 140* 107* 142* 204* 423* 241*  BUN 26*   <  > 18 10 7* '10 13 10  '$ CREATININE 1.19*   < > 0.83 0.70 0.53 0.75 0.79 0.77  CALCIUM 8.4*   < > 8.6* 8.2* 8.8* 9.1 9.0 9.4  MG 1.5*  --  2.3 1.9 1.8 2.4  --   --   PHOS 4.8*  --  1.7* 2.4* 2.6 3.3  --   --    < > = values in this interval not displayed.   GFR: Estimated Creatinine Clearance: 52.5 mL/min (by C-G formula based on SCr of 0.77 mg/dL). Liver Function Tests: Recent Labs  Lab 08/13/21 1704  AST 24  ALT 27  ALKPHOS 101  BILITOT 1.9*  PROT 6.3*  ALBUMIN 2.3*   No results for input(s): "LIPASE", "AMYLASE" in the last 168 hours. No results for input(s): "AMMONIA" in the last 168 hours. Coagulation Profile: No results for input(s): "INR", "PROTIME" in the last 168 hours.  Cardiac Enzymes: No results for input(s): "CKTOTAL", "CKMB", "CKMBINDEX", "TROPONINI" in the last 168 hours.  BNP (last 3 results) No results for input(s): "PROBNP" in the last 8760 hours. HbA1C: No results for input(s): "HGBA1C" in the last 72 hours. CBG: Recent Labs  Lab 08/19/21 2334 08/20/21 0108 08/20/21 0431 08/20/21 0836 08/20/21 1316  GLUCAP 356* 366* 281* 314* 264*   Lipid Profile: No results for input(s): "CHOL", "HDL", "LDLCALC", "TRIG", "CHOLHDL", "LDLDIRECT" in the last 72 hours. Thyroid Function Tests: No results for input(s): "TSH", "T4TOTAL", "FREET4", "T3FREE", "THYROIDAB" in the last 72 hours. Anemia Panel: No results for input(s): "VITAMINB12", "FOLATE", "FERRITIN", "TIBC", "IRON", "RETICCTPCT" in the last 72 hours. Sepsis Labs: Recent Labs  Lab 08/13/21 2040 08/14/21 0152 08/14/21 0427 08/14/21 1112  LATICACIDVEN 3.1* 3.9* 3.3* 1.8    Recent Results (from the past 240 hour(s))  Resp Panel by RT-PCR (Flu A&B, Covid) Anterior Nasal Swab     Status: None   Collection Time: 08/13/21 11:45 AM   Specimen: Anterior Nasal Swab  Result Value Ref Range Status   SARS Coronavirus 2 by RT PCR NEGATIVE NEGATIVE Final    Comment: (NOTE) SARS-CoV-2 target nucleic acids are NOT  DETECTED.  The SARS-CoV-2 RNA is generally detectable in upper respiratory specimens during the acute phase of infection. The lowest concentration of SARS-CoV-2 viral copies this assay can detect is 138 copies/mL. A negative result does not preclude SARS-Cov-2 infection and should not be used as the sole basis for treatment or other patient management decisions. A negative result may occur with  improper specimen collection/handling, submission of specimen other than nasopharyngeal swab, presence of viral mutation(s) within the areas targeted by this assay, and inadequate number of viral copies(<138 copies/mL). A negative result must be combined with clinical observations, patient history,  and epidemiological information. The expected result is Negative.  Fact Sheet for Patients:  EntrepreneurPulse.com.au  Fact Sheet for Healthcare Providers:  IncredibleEmployment.be  This test is no t yet approved or cleared by the [place] FDA and  has been authorized for detection and/or diagnosis of SARS-CoV-2 by FDA under an Emergency Use Authorization (EUA). This EUA will remain  in effect (meaning this test can be used) for the duration of the COVID-19 declaration under Section 564(b)(1) of the Act, 21 U.S.C.section 360bbb-3(b)(1), unless the authorization is terminated  or revoked sooner.       Influenza A by PCR NEGATIVE NEGATIVE Final   Influenza B by PCR NEGATIVE NEGATIVE Final    Comment: (NOTE) The Xpert Xpress SARS-CoV-2/FLU/RSV plus assay is intended as an aid in the diagnosis of influenza from Nasopharyngeal swab specimens and should not be used as a sole basis for treatment. Nasal washings and aspirates are unacceptable for Xpert Xpress SARS-CoV-2/FLU/RSV testing.  Fact Sheet for Patients: EntrepreneurPulse.com.au  Fact Sheet for Healthcare Providers: IncredibleEmployment.be  This test is not yet  approved or cleared by the [place] FDA and has been authorized for detection and/or diagnosis of SARS-CoV-2 by FDA under an Emergency Use Authorization (EUA). This EUA will remain in effect (meaning this test can be used) for the duration of the COVID-19 declaration under Section 564(b)(1) of the Act, 21 U.S.C. section 360bbb-3(b)(1), unless the authorization is terminated or revoked.  Performed at [facility], [address] [place], [place] [address]   Blood Culture (routine x 2)     Status: None   Collection Time: 08/13/21 11:45 AM   Specimen: BLOOD  Result Value Ref Range Status   Specimen Description BLOOD  Final   Special Requests   Final    BOTTLES DRAWN AEROBIC ONLY Blood Culture adequate volume   Culture   Final    NO GROWTH 5 DAYS Performed at [facility], [address] [place], [place] [address]    Report Status 08/18/2021 FINAL  Final  Urine Culture     Status: None   Collection Time: 08/13/21 11:45 AM   Specimen: In/Out Cath Urine  Result Value Ref Range Status   Specimen Description   Final    IN/OUT CATH URINE Performed at [facility], [address], [place], [place] [address]    Special Requests   Final    NONE Performed at [facility], [address], [place], [place] [address]    Culture   Final    NO GROWTH Performed at [facility], [place] [address], [place], [place] [address]    Report Status 08/14/2021 FINAL  Final  Gastrointestinal Panel by PCR , Stool     Status: None   Collection Time: 08/13/21  1:50 PM   Specimen: Stool  Result Value Ref Range Status   Campylobacter species NOT DETECTED NOT DETECTED Final   Plesimonas shigelloides NOT DETECTED NOT DETECTED Final   Salmonella species NOT DETECTED NOT DETECTED Final   Yersinia enterocolitica NOT DETECTED NOT DETECTED Final   Vibrio species NOT DETECTED NOT DETECTED Final   Vibrio cholerae NOT DETECTED NOT DETECTED Final    Enteroaggregative E coli (EAEC) NOT DETECTED NOT DETECTED Final   Enteropathogenic E coli (EPEC) NOT DETECTED NOT DETECTED Final   Enterotoxigenic E coli (ETEC) NOT DETECTED NOT DETECTED Final   Shiga like toxin producing E coli (STEC) NOT DETECTED NOT DETECTED Final   Shigella/Enteroinvasive E coli (EIEC) NOT DETECTED  NOT DETECTED Final   Cryptosporidium NOT DETECTED NOT DETECTED Final   Cyclospora cayetanensis NOT DETECTED NOT DETECTED Final   Entamoeba histolytica NOT DETECTED NOT DETECTED Final   Giardia lamblia NOT DETECTED NOT DETECTED Final   Adenovirus F40/41 NOT DETECTED NOT DETECTED Final   Astrovirus NOT DETECTED NOT DETECTED Final   Norovirus GI/GII NOT DETECTED NOT DETECTED Final   Rotavirus A NOT DETECTED NOT DETECTED Final   Sapovirus (I, II, IV, and V) NOT DETECTED NOT DETECTED Final    Comment: Performed at [facility], [address] [place], [place] [address]  C Difficile Quick Screen w PCR reflex     Status: None   Collection Time: 08/13/21  1:50 PM   Specimen: Stool  Result Value Ref Range Status   C Diff antigen NEGATIVE NEGATIVE Final   C Diff toxin NEGATIVE NEGATIVE Final   C Diff interpretation No C. difficile detected.  Final    Comment: Performed at [facility], [address] [place], [place] [address]  MRSA Next Gen by PCR, Nasal     Status: None   Collection Time: 08/13/21  5:30 PM   Specimen: Nasal Mucosa; Nasal Swab  Result Value Ref Range Status   MRSA by PCR Next Gen NOT DETECTED NOT DETECTED Final    Comment: (NOTE) The GeneXpert MRSA Assay (FDA approved for NASAL specimens only), is one component of a comprehensive MRSA colonization surveillance program. It is not intended to diagnose MRSA infection nor to guide or monitor treatment for MRSA infections. Test performance is not FDA approved in patients less than 57 years old. Performed at [facility], [address] [place], [place] [address]   Culture, blood  (Routine X 2) w Reflex to ID Panel     Status: None   Collection Time: 08/14/21  1:52 AM   Specimen: BLOOD  Result Value Ref Range Status   Specimen Description BLOOD LEFT HAND  Final   Special Requests   Final    BOTTLES DRAWN AEROBIC AND ANAEROBIC Blood Culture adequate volume   Culture   Final    NO GROWTH 5 DAYS Performed at [facility], [address] [place], [place] [address]    Report Status 08/19/2021 FINAL  Final         Radiology Studies: No results found.      Scheduled Meds:  amoxicillin-clavulanate  1 tablet Oral Q12H   aspirin EC  81 mg Oral Daily   atorvastatin  40 mg Oral QHS   Chlorhexidine Gluconate Cloth  6 each Topical Daily   enoxaparin (LOVENOX) injection  30 mg Subcutaneous QHS   famotidine  20 mg Oral Daily   feeding supplement (GLUCERNA SHAKE)  237 mL Oral TID BM   insulin aspart  0-15 Units Subcutaneous TID WC   insulin aspart  0-5 Units Subcutaneous QHS   insulin aspart  8 Units Subcutaneous TID WC   insulin glargine-yfgn  15 Units Subcutaneous Q0600   lipase/protease/amylase  36,000 Units Oral TID WC   midodrine  2.5 mg Oral TID WC   mirtazapine  15 mg Oral QHS   multivitamin with minerals  1 tablet Oral Daily   OLANZapine  7.5 mg Oral QHS   rOPINIRole  0.5 mg Oral QHS   Continuous Infusions:  sodium chloride 10 mL/hr at 08/15/21 1800   sodium chloride Stopped (08/14/21 0300)   lactated ringers 75 mL/hr at 08/20/21 6629    Assessment & Plan:   Principal  Problem:   DKA (diabetic ketoacidosis) ([place]) Active Problems:   Severe sepsis (HCC)   Essential hypertension   History of stroke   Restless leg syndrome   IPMN (intraductal papillary mucinous neoplasm)   HLD (hyperlipidemia)   Depression with anxiety   Protein-calorie malnutrition, severe   DKA (diabetic ketoacidosis) ([place]) Labile DM-1 Patient admitted with DKA which has been resolved.  -- Insulin infusion has been switched with basal and  short-acting. -Continue with SSI and Aspart --diabetes coordinator following --d/w RN not to give extra trays of food--ive snack in between meals 7/9 bg still elevated Increase semglee to 20units qd    Severe sepsis [facility]) --Patient presented with severe sepsis and septic shock secondary to pneumonia.  Initially required pressors due to insufficient response to IV fluid.  Now off the pressors and blood pressure mildly elevated. --Received broad-spectrum antibiotics and IV fluid per sepsis protocol. --Antibiotics were switched with Unasyn for concern of aspiration pneumonia--change to po augrmentin --sepsis resolved 7/8 bp on low side, symptomatic, gets dizzy on standing.  7/9 continue ivf   Essential hypertension -Patient was not on any antihypertensive medications at home. Blood pressure mildly elevated. -If remain elevated we will start her on low-dose losartan 7/8 bp on low side . Hold bp meds.  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  Being managed by oncology as an outpatient. -Continue with Creon   HLD (hyperlipidemia) - Continue with home Lipitor   Depression with anxiety - Restart home Zyprexa and mirtazapine   Protein-calorie malnutrition, severe Estimated body mass index is 17.22 kg/m as calculated from the following:   Height as of 07/26/21: '5\' 3"'$  (1.6 m).   Weight as of this encounter: 44.1 kg.   -Dietitian consult appreciated   DVT prophylaxis: Lovenox Code Status: Full Family Communication: None at bedside Disposition Plan: Home Status is: Inpatient Remains inpatient appropriate because: IV treatment     LOS: 7 days   Time spent: 35 minutes    Nolberto Hanlon, MD [facility] Pager [phone]  If 7PM-7AM, please contact night-coverage 08/20/2021, 1:44 PM  e

## 2021-08-20 NOTE — Plan of Care (Deleted)
  Problem: Coping: Goal: Ability to adjust to condition or change in health will improve 08/20/2021 0124 by Suzette Battiest, LPN Outcome: Not Progressing   Problem: Fluid Volume: Goal: Ability to maintain a balanced intake and output will improve 08/20/2021 0124 by Suzette Battiest, LPN Outcome: Not Progressing   Problem: Health Behavior/Discharge Planning: Goal: Ability to identify and utilize available resources and services will improve 08/20/2021 0124 by Suzette Battiest, LPN Outcome: Not Progressing   Problem: Health Behavior/Discharge Planning: Goal: Ability to manage health-related needs will improve 08/20/2021 0124 by Suzette Battiest, LPN Outcome: Not Progressing   Problem: Metabolic: Goal: Ability to maintain appropriate glucose levels will improve 08/20/2021 0124 by Suzette Battiest, LPN Outcome: Not Progressing   Problem: Nutritional: Goal: Maintenance of adequate nutrition will improve 08/20/2021 0124 by Suzette Battiest, LPN Outcome: Not Progressing   Problem: Tissue Perfusion: Goal: Adequacy of tissue perfusion will improve 08/20/2021 0124 by Suzette Battiest, LPN Outcome: Not Progressing

## 2021-08-20 NOTE — Plan of Care (Signed)
  Problem: Education: Goal: Ability to describe self-care measures that may prevent or decrease complications (Diabetes Survival Skills Education) will improve 08/20/2021 0124 by Suzette Battiest, LPN Outcome: Progressing   Problem: Coping: Goal: Ability to adjust to condition or change in health will improve 08/20/2021 0124 by Suzette Battiest, LPN Outcome: Progressing   Problem: Fluid Volume: Goal: Ability to maintain a balanced intake and output will improve 08/20/2021 0124 by Suzette Battiest, LPN Outcome: Progressing   Problem: Health Behavior/Discharge Planning: Goal: Ability to identify and utilize available resources and services will improve 08/20/2021 0124 by Suzette Battiest, LPN Outcome: Progressing   Problem: Health Behavior/Discharge Planning: Goal: Ability to manage health-related needs will improve 08/20/2021 0124 by Suzette Battiest, LPN Outcome: Not Progressing   Problem: Metabolic: Goal: Ability to maintain appropriate glucose levels will improve 08/20/2021 0124 by Suzette Battiest, LPN Outcome: Not Progressing   Problem: Nutritional: Goal: Maintenance of adequate nutrition will improve 08/20/2021 0124 by Suzette Battiest, LPN Outcome: Not Progressing   Problem: Skin Integrity: Goal: Risk for impaired skin integrity will decrease 08/20/2021 0124 by Suzette Battiest, LPN Outcome: Progressing   Problem: Tissue Perfusion: Goal: Adequacy of tissue perfusion will improve 08/20/2021 0124 by Suzette Battiest, LPN Outcome: Progressing   Problem: Education: Goal: Knowledge of General Education information will improve Description: Including pain rating scale, medication(s)/side effects and non-pharmacologic comfort measures 08/20/2021 0124 by Suzette Battiest, LPN Outcome: Progressing   Problem: Health Behavior/Discharge Planning: Goal: Ability to manage health-related needs will improve 08/20/2021 0124 by Suzette Battiest, LPN Outcome: Not Progressing   Problem: Clinical  Measurements: Goal: Will remain free from infection 08/20/2021 0124 by Suzette Battiest, LPN Outcome: Progressing   Problem: Coping: Goal: Level of anxiety will decrease 08/20/2021 0124 by Suzette Battiest, LPN Outcome: Progressing   Problem: Pain Managment: Goal: General experience of comfort will improve 08/20/2021 0124 by Suzette Battiest, LPN Outcome: Progressing   Problem: Safety: Goal: Ability to remain free from injury will improve 08/20/2021 0124 by Suzette Battiest, LPN Outcome: Progressing   Problem: Skin Integrity: Goal: Risk for impaired skin integrity will decrease 08/20/2021 0124 by Suzette Battiest, LPN Outcome: Progressing

## 2021-08-21 DIAGNOSIS — I10 Essential (primary) hypertension: Secondary | ICD-10-CM | POA: Diagnosis not present

## 2021-08-21 DIAGNOSIS — F418 Other specified anxiety disorders: Secondary | ICD-10-CM | POA: Diagnosis not present

## 2021-08-21 DIAGNOSIS — Z8673 Personal history of transient ischemic attack (TIA), and cerebral infarction without residual deficits: Secondary | ICD-10-CM | POA: Diagnosis not present

## 2021-08-21 DIAGNOSIS — E1110 Type 2 diabetes mellitus with ketoacidosis without coma: Secondary | ICD-10-CM | POA: Diagnosis not present

## 2021-08-21 LAB — CBC
HCT: 33.5 % — ABNORMAL LOW (ref 36.0–46.0)
Hemoglobin: 10.4 g/dL — ABNORMAL LOW (ref 12.0–15.0)
MCH: 27.7 pg (ref 26.0–34.0)
MCHC: 31 g/dL (ref 30.0–36.0)
MCV: 89.1 fL (ref 80.0–100.0)
Platelets: 370 K/uL (ref 150–400)
RBC: 3.76 MIL/uL — ABNORMAL LOW (ref 3.87–5.11)
RDW: 18.2 % — ABNORMAL HIGH (ref 11.5–15.5)
WBC: 8.1 K/uL (ref 4.0–10.5)
nRBC: 0.2 % (ref 0.0–0.2)

## 2021-08-21 LAB — BASIC METABOLIC PANEL WITH GFR
Anion gap: 7 (ref 5–15)
BUN: 18 mg/dL (ref 8–23)
CO2: 21 mmol/L — ABNORMAL LOW (ref 22–32)
Calcium: 8.8 mg/dL — ABNORMAL LOW (ref 8.9–10.3)
Chloride: 103 mmol/L (ref 98–111)
Creatinine, Ser: 0.63 mg/dL (ref 0.44–1.00)
GFR, Estimated: >60 mL/min
Glucose, Bld: 270 mg/dL — ABNORMAL HIGH (ref 70–99)
Potassium: 6.1 mmol/L — ABNORMAL HIGH (ref 3.5–5.1)
Sodium: 131 mmol/L — ABNORMAL LOW (ref 135–145)

## 2021-08-21 LAB — GLUCOSE, CAPILLARY
Glucose-Capillary: 166 mg/dL — ABNORMAL HIGH (ref 70–99)
Glucose-Capillary: 268 mg/dL — ABNORMAL HIGH (ref 70–99)
Glucose-Capillary: 373 mg/dL — ABNORMAL HIGH (ref 70–99)
Glucose-Capillary: 114 mg/dL — ABNORMAL HIGH (ref 70–99)
Glucose-Capillary: 102 mg/dL — ABNORMAL HIGH (ref 70–99)

## 2021-08-21 LAB — POTASSIUM: Potassium: 5.5 mmol/L — ABNORMAL HIGH (ref 3.5–5.1)

## 2021-08-21 MED ORDER — INSULIN ASPART 100 UNIT/ML IJ SOLN
12.0000 [IU] | Freq: Three times a day (TID) | INTRAMUSCULAR | Status: DC
  Administered 2021-08-21: 12 [IU] via SUBCUTANEOUS
  Filled 2021-08-21: qty 1

## 2021-08-21 MED ORDER — SODIUM ZIRCONIUM CYCLOSILICATE 10 G PO PACK
10.0000 g | PACK | Freq: Once | ORAL | Status: AC
  Administered 2021-08-21: 10 g via ORAL
  Filled 2021-08-21: qty 1

## 2021-08-21 MED ORDER — INSULIN ASPART 100 UNIT/ML IJ SOLN
10.0000 [IU] | Freq: Three times a day (TID) | INTRAMUSCULAR | Status: DC
  Administered 2021-08-22 (×2): 10 [IU] via SUBCUTANEOUS
  Filled 2021-08-21 (×2): qty 1

## 2021-08-21 NOTE — Progress Notes (Signed)
PIV consult: Currently has no IV medications ordered. Pt is known to IV Team and removes her PIVs. This causes undue trauma to veins leading to increasingly difficult IV access. Collaborated with Frederico Hamman, RN for vessel preservation plan: He will reenter consult when IV med is needed.

## 2021-08-21 NOTE — Plan of Care (Signed)
  Problem: Education: Goal: Ability to describe self-care measures that may prevent or decrease complications (Diabetes Survival Skills Education) will improve 08/21/2021 0334 by Suzette Battiest, LPN Outcome: Not Progressing   Problem: Coping: Goal: Ability to adjust to condition or change in health will improve 08/21/2021 0334 by Suzette Battiest, LPN Outcome: Not Progressing   Problem: Fluid Volume: Goal: Ability to maintain a balanced intake and output will improve 08/21/2021 0334 by Suzette Battiest, LPN Outcome: Not Progressing   Problem: Health Behavior/Discharge Planning: Goal: Ability to identify and utilize available resources and services will improve 08/21/2021 0338 by Suzette Battiest, LPN Outcome: Progressing   Problem: Metabolic: Goal: Ability to maintain appropriate glucose levels will improve 08/21/2021 0334 by Suzette Battiest, LPN Outcome: Not Progressing   Problem: Nutritional: Goal: Maintenance of adequate nutrition will improve 08/21/2021 0334 by Suzette Battiest, LPN Outcome: Not Progressing   Problem: Nutritional: Goal: Progress toward achieving an optimal weight will improve 08/21/2021 0338 by Suzette Battiest, LPN Outcome: Progressing   Problem: Skin Integrity: Goal: Risk for impaired skin integrity will decrease 08/21/2021 0338 by Suzette Battiest, LPN Outcome: Progressing   Problem: Tissue Perfusion: Goal: Adequacy of tissue perfusion will improve 08/21/2021 0338 by Suzette Battiest, LPN Outcome: Progressing   Problem: Education: Goal: Knowledge of General Education information will improve Description: Including pain rating scale, medication(s)/side effects and non-pharmacologic comfort measures 08/21/2021 0334 by Suzette Battiest, LPN Outcome: Not Progressing   Problem: Health Behavior/Discharge Planning: Goal: Ability to manage health-related needs will improve 08/21/2021 0334 by Suzette Battiest, LPN Outcome: Not Progressing   Problem: Clinical  Measurements: Goal: Respiratory complications will improve 08/21/2021 0338 by Suzette Battiest, LPN Outcome: Progressing   Problem: Clinical Measurements: Goal: Cardiovascular complication will be avoided 08/21/2021 0338 by Suzette Battiest, LPN Outcome: Progressing   Problem: Nutrition: Goal: Adequate nutrition will be maintained 08/21/2021 0334 by Suzette Battiest, LPN Outcome: Not Progressing   Problem: Coping: Goal: Level of anxiety will decrease 08/21/2021 0338 by Suzette Battiest, LPN Outcome: Progressing   Problem: Pain Managment: Goal: General experience of comfort will improve 08/21/2021 0338 by Suzette Battiest, LPN Outcome: Progressing   Problem: Safety: Goal: Ability to remain free from injury will improve 08/21/2021 0338 by Suzette Battiest, LPN Outcome: Progressing   Problem: Skin Integrity: Goal: Risk for impaired skin integrity will decrease 08/21/2021 0338 by Suzette Battiest, LPN Outcome: Progressing

## 2021-08-21 NOTE — TOC Progression Note (Signed)
Transition of Care [facility]) - Progression Note    Patient Details  Name: Brittany [name] MRN: [id number] Date of Birth: December 05, 1960  Transition of Care [facility] CM/SW [place], RN Phone Number: 08/21/2021, 10:47 AM  Clinical Narrative:   RNCM checked [place] still pending.  Will follow up    Expected Discharge Plan: [place] Barriers to Discharge: Continued Medical Work up  Expected Discharge Plan and Services Expected Discharge Plan: [place] arrangements for the past 2 months: Single Family Home                                       Social Determinants of Health (SDOH) Interventions    Readmission Risk Interventions    03/06/2021    9:13 AM 02/16/2021    2:45 PM  Readmission Risk Prevention Plan  Transportation Screening Complete Complete  Medication Review ([place]) Complete Complete  PCP or Specialist appointment within 3-5 days of discharge Complete   SW Recovery Care/Counseling Consult  Complete  Palliative Care Screening Not Applicable Not [place] Not Applicable Not Applicable

## 2021-08-21 NOTE — Progress Notes (Signed)
Occupational Therapy Treatment Patient Details Name: Brittany [name] MRN: [id number] DOB: [birth year] Today's Date: 08/21/2021   History of present illness Pt. is a 61 y.o. F presenting to [facility] on 7/2 with AMS. Admitted to SDU with acute metabolic encephalopathy, severe sepsis with septic shock secondary to pneumonia likely aspiration, hypothermia, and DKA. 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   OT comments  Pt seen for OT tx. Pt seated in recliner, just finished up from bathing with the nurse tech. Pt set up with socks and able to don without direct assist, just supervision for safety, SBA-CGA for ADL transfers with VC for hand placement to improve safety/technique, and SBA-CGA for grooming tasks at the sink. Pt progressing towards goals, however continue to recommend SNF at this time.    Recommendations for follow up therapy are one component of a multi-disciplinary discharge planning process, led by the attending physician.  Recommendations may be updated based on patient status, additional functional criteria and insurance authorization.    Follow Up Recommendations  Skilled nursing-short term rehab (<3 hours/day)    Assistance Recommended at Discharge Frequent or constant Supervision/Assistance  Patient can return home with the following  Assistance with cooking/housework;Assist for transportation;Help with stairs or ramp for entrance;Direct supervision/assist for medications management;Direct supervision/assist for financial management;A little help with walking and/or transfers;A little help with bathing/dressing/bathroom   Equipment Recommendations  Other (comment) (defer to next venue)    Recommendations for Other Services      Precautions / Restrictions Precautions Precautions:  Fall Precaution Comments: colostomy Restrictions Weight Bearing Restrictions: No       Mobility Bed Mobility               General bed mobility comments: pt was in recliner pre/post session    Transfers Overall transfer level: Needs assistance Equipment used: None Transfers: Sit to/from Stand Sit to Stand: Min guard           General transfer comment: CGA to stand with VC for hand placement to improve safety     Balance Overall balance assessment: Needs assistance Sitting-balance support: Feet unsupported, Feet supported, No upper extremity supported Sitting balance-Leahy Scale: Good Sitting balance - Comments: good for sitting in recliner for LB ADL     Standing balance-Leahy Scale: Fair Standing balance comment: intermittent UE support for safety                           ADL either performed or assessed with clinical judgement   ADL                                         General ADL Comments: Pt required set up and supervision for seated LB bathing and dressing from recliner. CGA - SBA for standing at the sink for grooming tasks.    Extremity/Trunk Assessment              Vision       Perception     Praxis      Cognition Arousal/Alertness: Awake/alert Behavior During Therapy: Impulsive Overall Cognitive Status: Within Functional Limits for tasks assessed  Exercises      Shoulder Instructions       General Comments      Pertinent Vitals/ Pain       Pain Assessment Pain Assessment: 0-10 Pain Score: 7  Pain Location: back Pain Descriptors / Indicators: Aching Pain Intervention(s): Limited activity within patient's tolerance, Monitored during session, Repositioned  Home Living                                          Prior Functioning/Environment              Frequency  Min 2X/week        Progress Toward  Goals  OT Goals(current goals can now be found in the care plan section)  Progress towards OT goals: Progressing toward goals  Acute Rehab OT Goals Patient Stated Goal: pt reports wanting to go back home OT Goal Formulation: With patient Time For Goal Achievement: 08/29/21 Potential to Achieve Goals: Good  Plan Discharge plan remains appropriate;Frequency remains appropriate    Co-evaluation                 AM-PAC OT "6 Clicks" Daily Activity     Outcome Measure   Help from another person eating meals?: None Help from another person taking care of personal grooming?: A Little Help from another person toileting, which includes using toliet, bedpan, or urinal?: A Little Help from another person bathing (including washing, rinsing, drying)?: A Little Help from another person to put on and taking off regular upper body clothing?: A Little Help from another person to put on and taking off regular lower body clothing?: A Little 6 Click Score: 19    End of Session    OT Visit Diagnosis: Other abnormalities of gait and mobility (R26.89);Muscle weakness (generalized) (M62.81)   Activity Tolerance Patient tolerated treatment well   Patient Left in chair;with call bell/phone within reach;with chair alarm set   Nurse Communication          Time: 2992-4268 OT Time Calculation (min): 10 min  Charges: OT General Charges $OT Visit: 1 Visit OT Treatments $Self Care/Home Management : 8-22 mins  Ardeth Perfect., MPH, MS, OTR/L ascom [phone] 08/21/21, 1:36 PM

## 2021-08-21 NOTE — Progress Notes (Signed)
Physical Therapy Treatment Patient Details Name: Brittany [name] MRN: [id number] DOB: March 16, 1960 Today's Date: 08/21/2021   History of Present Illness Pt. is a 61 y.o. F presenting to [facility] on 7/2 with AMS. Admitted to SDU with acute metabolic encephalopathy, severe sepsis with septic shock secondary to pneumonia likely aspiration, hypothermia, and DKA. 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    PT Comments    Pt received supine in bed agreeable to PT services. Independent with bed mobility. Impulsively stands at bedside despite PT cuing to remain seated. Overall status standing balance appreciated. Pt progressing gait to 320' with HHA. Noted variable step lengths and mild unsteadiness with drifting R/L. Education provided on slowing gait speed and utilizing visual target for improving ambulation in straight line with good carryover.  Chair follow provided for safety due to poor safety awareness and history of dizziness with OOB mobility. Pt returning to room performing standing therex and placed in recliner post session. All needs inr each with fall precautions in placed. RN updated on pt position. Due to impulsivity and deficits in safety awareness, still encouraged and rec STR at discharge.   Recommendations for follow up therapy are one component of a multi-disciplinary discharge planning process, led by the attending physician.  Recommendations may be updated based on patient status, additional functional criteria and insurance authorization.  Follow Up Recommendations  Skilled nursing-short term rehab (<3 hours/day) Can patient physically be transported by private vehicle: Yes   Assistance Recommended at Discharge Frequent or constant Supervision/Assistance  Patient can return home with the following A  little help with walking and/or transfers;A little help with bathing/dressing/bathroom;Direct supervision/assist for medications management;Direct supervision/assist for financial management;Assist for transportation;Help with stairs or ramp for entrance   Equipment Recommendations  Rolling walker (2 wheels)    Recommendations for Other Services       Precautions / Restrictions Precautions Precautions: Fall Precaution Comments: colostomy Restrictions Weight Bearing Restrictions: No     Mobility  Bed Mobility Overal bed mobility: Independent               Patient Response: Cooperative  Transfers     Transfers: Sit to/from Stand Sit to Stand: Supervision           General transfer comment: relies on counter to safely stand. Impulsively standing    Ambulation/Gait Ambulation/Gait assistance: Min guard Gait Distance (Feet): 320 Feet Assistive device: None, 1 person hand held assist Gait Pattern/deviations: Step-through pattern, Drifts right/left, Narrow base of support       General Gait Details: with 1HHA, ambulating with variable step lengths. Use of visual target improves gait. Chair follow provided   Stairs             Wheelchair Mobility    Modified Rankin (Stroke Patients Only)       Balance Overall balance assessment: Needs assistance Sitting-balance support: No upper extremity supported, Feet supported Sitting balance-Leahy Scale: Fair     Standing balance support: Bilateral upper extremity supported Standing balance-Leahy Scale: Poor Standing balance comment: high fall risk due to impulsivity and poor safety awareness                            Cognition Arousal/Alertness: Awake/alert Behavior During Therapy: Impulsive Overall Cognitive Status: Within Functional Limits for tasks assessed  General Comments: Pleasant and cooperative. Able to follow comamnds         Exercises General Exercises - Lower Extremity Long Arc Quad: AROM, Strengthening, Both, Seated Hip ABduction/ADduction: AROM, Strengthening, 10 reps, Seated, Both Hip Flexion/Marching: AROM, Standing, Strengthening, 10 reps, Both Other Exercises Other Exercises: use of visual target for improving steadied gait    General Comments        Pertinent Vitals/Pain Pain Assessment Pain Assessment: No/denies pain    Home Living                          Prior Function            PT Goals (current goals can now be found in the care plan section) Acute Rehab PT Goals Patient Stated Goal: none stated PT Goal Formulation: Patient unable to participate in goal setting Time For Goal Achievement: 08/29/21 Potential to Achieve Goals: Fair Progress towards PT goals: Progressing toward goals    Frequency    Min 2X/week      PT Plan Current plan remains appropriate    Co-evaluation              AM-PAC PT "6 Clicks" Mobility   Outcome Measure  Help needed turning from your back to your side while in a flat bed without using bedrails?: A Little Help needed moving from lying on your back to sitting on the side of a flat bed without using bedrails?: A Little Help needed moving to and from a bed to a chair (including a wheelchair)?: A Little Help needed standing up from a chair using your arms (e.g., wheelchair or bedside chair)?: A Little Help needed to walk in hospital room?: A Little Help needed climbing 3-5 steps with a railing? : A Little 6 Click Score: 18    End of Session Equipment Utilized During Treatment: Gait belt Activity Tolerance: Patient tolerated treatment well Patient left: in chair;with call bell/phone within reach;with chair alarm set Nurse Communication: Mobility status PT Visit Diagnosis: Muscle weakness (generalized) (M62.81);Difficulty in walking, not elsewhere classified (R26.2)     Time: 2585-2778 PT Time Calculation (min) (ACUTE ONLY):  14 min  Charges:  $Gait Training: 8-22 mins                     Salem Caster. Fairly IV, PT, DPT Physical Therapist- [facility]  08/21/2021, 10:43 AM

## 2021-08-21 NOTE — Progress Notes (Signed)
PROGRESS NOTE    Brittany [name]  RDE:[id number] DOB: 05-Mar-1960 DOA: 08/13/2021 PCP: Pcp, No    Brief Narrative:  Taken from prior notes.  ICU transfer.   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  Pts daughter reported to EMS the pt was "fine" at 08:30 am today, however she later found her on the floor prompting EMS notification.  Per EMS run sheet upon their arrival at pts home she was found pale, cold, and clammy.  Her CBG readings were 442 and EMS was unable to obtain a temperature.  Pt placed on 6L nasal canula.  En route to the ER code sepsis initiated and pt received 500 ml iv fluid bolus. Pt remained altered and placed on 15L NRB.  She was recently hospitalized between 06/14 to 06/16 following treatment of CAP and DKA.     ED Course In the ER pt alert but disoriented.  ED vital signs were: bp 74/64, hr 105, resp rate 13, pt hypothermic, and O2 sats were 100%.  Sepsis protocol initiated: pt received cefepime, flagyl, vancomycin, 2.5 LR fluid bolus.  Labs were obtained.   CMP: Na+ 131, CO2 <7, glucose 592, BUN 28, creatinine 1.38, anion gap not calculated, albumin 2.6, total bilirubin 1.6, lactic acid 3.7, and pct 0.11 CBC: WBC 13.0 and Hgb 11.0 CXR: Increased right lower lobe infiltrate, suspicious for pneumonia .   Admitted with PCCM for concern of DKA and sepsis with septic shock secondary to pneumonia. Patient initially received broad-spectrum antibiotics with Zosyn, vancomycin and Flagyl under sepsis protocol and then transitioned to Unasyn for concern of aspiration pneumonia. Patient also required pressors due to insufficient response to  IV fluid.   7/4: Patient now hemodynamically stable.  Off the pressors.  Insulin infusion was switched with basal and short-acting.  Care is being transitioned to [facility].  Mild hypokalemia and hypophosphatemia which is being repleted.   7/5: Remained hemodynamically stable.  Awaiting placement.  Ordered removal of central line. PT is recommending SNF-TOC is working on it  7/10 potassium 6.1 , then stat repeat 5.5. ekg no changes.   Consultants:  pccm  Procedures:   Antimicrobials:      Subjective: No cp or sob. No complaints  Objective: Vitals:   08/21/21 0329 08/21/21 0500 08/21/21 0725 08/21/21 1156  BP: 101/76  (!) 117/91 (!) 114/91  Pulse: 87  81 87  Resp: '19  18 16  '$ Temp: (!) 97.5 F (36.4 C)   97.7 F (36.5 C)  TempSrc:    Oral  SpO2: 100%  100% 100%  Weight:  41.5 kg      Intake/Output Summary (Last 24 hours) at 08/21/2021 1514 Last data filed at 08/21/2021 1300 Gross per 24 hour  Intake 1645 ml  Output 1101 ml  Net 544 ml   Filed Weights   08/13/21 1626 08/18/21 0500 08/21/21 0500  Weight: 44.1 kg 45 kg 41.5 kg    Examination: Calm, NAD Cta no w/r Reg s1/s2 no gallop Soft benign +bs No edema Grossly intact. axa Mood and affect appropriate in current setting     Data Reviewed: I have personally reviewed following labs and imaging studies  CBC: Recent Labs  Lab 08/15/21 0335 08/16/21 0523 08/17/21  5400 08/21/21 0526  WBC 8.6 6.9 5.6 8.1  NEUTROABS 5.7 3.0 2.1  --   HGB 9.2* 10.2* 10.6* 10.4*  HCT 26.9* 30.3* 33.0* 33.5*  MCV 81.0 83.2 84.4 89.1  PLT 300 363 328 867   Basic Metabolic Panel: Recent Labs  Lab 08/15/21 0335 08/16/21 0523 08/17/21 0358 08/18/21 0518 08/19/21 0905 08/21/21 0526 08/21/21 0909  NA 136 138 136 132* 132* 131*  --   K 3.3* 3.4* 4.5 5.0 4.7 6.1* 5.5*  CL 104 100 99 99 96* 103  --   CO2 '27 30 27 28 24 '$ 21*  --   GLUCOSE 107* 142* 204* 423* 241* 270*  --   BUN 10 7* '10 13 10 18  '$ --   CREATININE 0.70 0.53  0.75 0.79 0.77 0.63  --   CALCIUM 8.2* 8.8* 9.1 9.0 9.4 8.8*  --   MG 1.9 1.8 2.4  --   --   --   --   PHOS 2.4* 2.6 3.3  --   --   --   --    GFR: Estimated Creatinine Clearance: 48.4 mL/min (by C-G formula based on SCr of 0.63 mg/dL). Liver Function Tests: No results for input(s): "AST", "ALT", "ALKPHOS", "BILITOT", "PROT", "ALBUMIN" in the last 168 hours.  No results for input(s): "LIPASE", "AMYLASE" in the last 168 hours. No results for input(s): "AMMONIA" in the last 168 hours. Coagulation Profile: No results for input(s): "INR", "PROTIME" in the last 168 hours.  Cardiac Enzymes: No results for input(s): "CKTOTAL", "CKMB", "CKMBINDEX", "TROPONINI" in the last 168 hours.  BNP (last 3 results) No results for input(s): "PROBNP" in the last 8760 hours. HbA1C: No results for input(s): "HGBA1C" in the last 72 hours. CBG: Recent Labs  Lab 08/20/21 1616 08/20/21 2306 08/21/21 0210 08/21/21 0740 08/21/21 1156  GLUCAP 264* 173* 166* 268* 373*   Lipid Profile: No results for input(s): "CHOL", "HDL", "LDLCALC", "TRIG", "CHOLHDL", "LDLDIRECT" in the last 72 hours. Thyroid Function Tests: No results for input(s): "TSH", "T4TOTAL", "FREET4", "T3FREE", "THYROIDAB" in the last 72 hours. Anemia Panel: No results for input(s): "VITAMINB12", "FOLATE", "FERRITIN", "TIBC", "IRON", "RETICCTPCT" in the last 72 hours. Sepsis Labs: No results for input(s): "PROCALCITON", "LATICACIDVEN" in the last 168 hours.   Recent Results (from the past 240 hour(s))  Resp Panel by RT-PCR (Flu A&B, Covid) Anterior Nasal Swab     Status: None   Collection Time: 08/13/21 11:45 AM   Specimen: Anterior Nasal Swab  Result Value Ref Range Status   SARS Coronavirus 2 by RT PCR NEGATIVE NEGATIVE Final    Comment: (NOTE) SARS-CoV-2 target nucleic acids are NOT DETECTED.  The SARS-CoV-2 RNA is generally detectable in upper respiratory specimens during the acute phase of infection. The lowest concentration of  SARS-CoV-2 viral copies this assay can detect is 138 copies/mL. A negative result does not preclude SARS-Cov-2 infection and should not be used as the sole basis for treatment or other patient management decisions. A negative result may occur with  improper specimen collection/handling, submission of specimen other than nasopharyngeal swab, presence of viral mutation(s) within the areas targeted by this assay, and inadequate number of viral copies(<138 copies/mL). A negative result must be combined with clinical observations, patient history, and epidemiological information. The expected result is Negative.  Fact Sheet for Patients:  EntrepreneurPulse.com.au  Fact Sheet for Healthcare Providers:  IncredibleEmployment.be  This test is no t yet approved or cleared by the [place] FDA and  has been authorized for detection  and/or diagnosis of SARS-CoV-2 by FDA under an Emergency Use Authorization (EUA). This EUA will remain  in effect (meaning this test can be used) for the duration of the COVID-19 declaration under Section 564(b)(1) of the Act, 21 U.S.C.section 360bbb-3(b)(1), unless the authorization is terminated  or revoked sooner.       Influenza A by PCR NEGATIVE NEGATIVE Final   Influenza B by PCR NEGATIVE NEGATIVE Final    Comment: (NOTE) The Xpert Xpress SARS-CoV-2/FLU/RSV plus assay is intended as an aid in the diagnosis of influenza from Nasopharyngeal swab specimens and should not be used as a sole basis for treatment. Nasal washings and aspirates are unacceptable for Xpert Xpress SARS-CoV-2/FLU/RSV testing.  Fact Sheet for Patients: EntrepreneurPulse.com.au  Fact Sheet for Healthcare Providers: IncredibleEmployment.be  This test is not yet approved or cleared by the [place] FDA and has been authorized for detection and/or diagnosis of SARS-CoV-2 by FDA under an Emergency Use  Authorization (EUA). This EUA will remain in effect (meaning this test can be used) for the duration of the COVID-19 declaration under Section 564(b)(1) of the Act, 21 U.S.C. section 360bbb-3(b)(1), unless the authorization is terminated or revoked.  Performed at [facility], [address] [place], [place] [address]   Blood Culture (routine x 2)     Status: None   Collection Time: 08/13/21 11:45 AM   Specimen: BLOOD  Result Value Ref Range Status   Specimen Description BLOOD  Final   Special Requests   Final    BOTTLES DRAWN AEROBIC ONLY Blood Culture adequate volume   Culture   Final    NO GROWTH 5 DAYS Performed at [facility], [address], [place], [place] [address]    Report Status 08/18/2021 FINAL  Final  Urine Culture     Status: None   Collection Time: 08/13/21 11:45 AM   Specimen: In/Out Cath Urine  Result Value Ref Range Status   Specimen Description   Final    IN/OUT CATH URINE Performed at [facility], [address], [place], [place] [address]    Special Requests   Final    NONE Performed at [facility], [address] [place], [place] [address]    Culture   Final    NO GROWTH Performed at [facility], [place] [address], [place], [place] [address]    Report Status 08/14/2021 FINAL  Final  Gastrointestinal Panel by PCR , Stool     Status: None   Collection Time: 08/13/21  1:50 PM   Specimen: Stool  Result Value Ref Range Status   Campylobacter species NOT DETECTED NOT DETECTED Final   Plesimonas shigelloides NOT DETECTED NOT DETECTED Final   Salmonella species NOT DETECTED NOT DETECTED Final   Yersinia enterocolitica NOT DETECTED NOT DETECTED Final   Vibrio species NOT DETECTED NOT DETECTED Final   Vibrio cholerae NOT DETECTED NOT DETECTED Final   Enteroaggregative E coli (EAEC) NOT DETECTED NOT DETECTED Final   Enteropathogenic E coli (EPEC) NOT DETECTED NOT DETECTED Final   Enterotoxigenic E coli  (ETEC) NOT DETECTED NOT DETECTED Final   Shiga like toxin producing E coli (STEC) NOT DETECTED NOT DETECTED Final   Shigella/Enteroinvasive E coli (EIEC) NOT DETECTED NOT DETECTED Final   Cryptosporidium NOT DETECTED NOT DETECTED Final   Cyclospora cayetanensis NOT DETECTED NOT DETECTED Final   Entamoeba histolytica NOT DETECTED NOT DETECTED Final   Giardia lamblia NOT DETECTED NOT DETECTED Final   Adenovirus F40/41 NOT DETECTED NOT  DETECTED Final   Astrovirus NOT DETECTED NOT DETECTED Final   Norovirus GI/GII NOT DETECTED NOT DETECTED Final   Rotavirus A NOT DETECTED NOT DETECTED Final   Sapovirus (I, II, IV, and V) NOT DETECTED NOT DETECTED Final    Comment: Performed at [facility], [place], [place] [address]  C Difficile Quick Screen w PCR reflex     Status: None   Collection Time: 08/13/21  1:50 PM   Specimen: Stool  Result Value Ref Range Status   C Diff antigen NEGATIVE NEGATIVE Final   C Diff toxin NEGATIVE NEGATIVE Final   C Diff interpretation No C. difficile detected.  Final    Comment: Performed at [facility], [place], [address] [place], [place] [address]  MRSA Next Gen by PCR, Nasal     Status: None   Collection Time: 08/13/21  5:30 PM   Specimen: Nasal Mucosa; Nasal Swab  Result Value Ref Range Status   MRSA by PCR Next Gen NOT DETECTED NOT DETECTED Final    Comment: (NOTE) The GeneXpert MRSA Assay (FDA approved for NASAL specimens only), is one component of a comprehensive MRSA colonization surveillance program. It is not intended to diagnose MRSA infection nor to guide or monitor treatment for MRSA infections. Test performance is not FDA approved in patients less than 40 years old. Performed at [facility], [address] [place], [place] [address]   Culture, blood (Routine X 2) w Reflex to ID Panel     Status: None   Collection Time: 08/14/21  1:52 AM   Specimen: BLOOD  Result Value Ref Range Status   Specimen  Description BLOOD LEFT HAND  Final   Special Requests   Final    BOTTLES DRAWN AEROBIC AND ANAEROBIC Blood Culture adequate volume   Culture   Final    NO GROWTH 5 DAYS Performed at [facility], [address], [place],  [address]    Report Status 08/19/2021 FINAL  Final         Radiology Studies: No results found.      Scheduled Meds:  aspirin EC  81 mg Oral Daily   atorvastatin  40 mg Oral QHS   Chlorhexidine Gluconate Cloth  6 each Topical Daily   enoxaparin (LOVENOX) injection  30 mg Subcutaneous QHS   famotidine  20 mg Oral Daily   feeding supplement (GLUCERNA SHAKE)  237 mL Oral TID BM   insulin aspart  0-15 Units Subcutaneous TID WC   insulin aspart  0-5 Units Subcutaneous QHS   insulin aspart  12 Units Subcutaneous TID WC   insulin glargine-yfgn  20 Units Subcutaneous Q0600   lipase/protease/amylase  36,000 Units Oral TID WC   midodrine  2.5 mg Oral TID WC   mirtazapine  15 mg Oral QHS   multivitamin with minerals  1 tablet Oral Daily   OLANZapine  7.5 mg Oral QHS   rOPINIRole  0.5 mg Oral QHS   Continuous Infusions:  sodium chloride 10 mL/hr at 08/21/21 0333   sodium chloride Stopped (08/14/21 0300)    Assessment & Plan:   Principal Problem:   DKA (diabetic ketoacidosis) ([place]) Active Problems:   Severe sepsis ([place])   Essential hypertension   History of stroke   Restless leg syndrome   IPMN (intraductal papillary mucinous neoplasm)   HLD (hyperlipidemia)   Depression with anxiety   Protein-calorie malnutrition, severe   DKA (diabetic ketoacidosis) ([place]) Labile DM-1 Patient admitted with DKA which  has been resolved.  -- Insulin infusion has been switched with basal and short-acting. -Continue with SSI and Aspart --diabetes coordinator following --d/w RN not to give extra trays of food--ive snack in between meals 7/10 bg variable. Increase novolog 10 units tid with meals.  Continue semglee     Severe sepsis  [facility]) --Patient presented with severe sepsis and septic shock secondary to pneumonia.  Initially required pressors due to insufficient response to IV fluid.  Now off the pressors and blood pressure mildly elevated. --Received broad-spectrum antibiotics and IV fluid per sepsis protocol. --Antibiotics were switched with Unasyn for concern of aspiration pneumonia--change to po augrmentin --sepsis resolved 7/8 bp on low side, symptomatic, gets dizzy on standing.  7/10 dc ivf. As na down. monitor   Essential hypertension -Patient was not on any antihypertensive medications at home. Blood pressure mildly elevated. -If remain elevated we will start her on low-dose losartan 7/8 bp on low side . Hold bp meds.  7/10 on midodrine. No dizziness with standing.   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  Being managed by oncology as an outpatient. -Continue with Creon   HLD (hyperlipidemia) - Continue with home Lipitor   Depression with anxiety - Restart home Zyprexa and mirtazapine   Protein-calorie malnutrition, severe Estimated body mass index is 17.22 kg/m as calculated from the following:   Height as of 07/26/21: '5\' 3"'$  (1.6 m).   Weight as of this encounter: 44.1 kg.   -Dietitian consult appreciated   DVT prophylaxis: Lovenox Code Status: Full Family Communication: None at bedside Disposition Plan: snf Status is: Inpatient Remains inpatient appropriate because: IV treatment, snf, potassium elevetd     LOS: 8 days   Time spent: 35 minutes    Nolberto Hanlon, MD [facility] Pager [phone]  If 7PM-7AM, please contact night-coverage 08/21/2021, 3:14 PM

## 2021-08-21 NOTE — Inpatient Diabetes Management (Signed)
Inpatient Diabetes Program Recommendations  AACE/ADA: New Consensus Statement on Inpatient Glycemic Control   Target Ranges:  Prepandial:   less than 140 mg/dL      Peak postprandial:   less than 180 mg/dL (1-2 hours)      Critically ill patients:  140 - 180 mg/dL    Latest Reference Range & Units 08/20/21 08:36 08/20/21 13:16 08/20/21 16:16 08/20/21 23:06 08/21/21 02:10  Glucose-Capillary 70 - 99 mg/dL 314 (H) 264 (H) 264 (H) 173 (H) 166 (H)   Review of Glycemic Control  Diabetes history: DM1 Outpatient Diabetes medications: Lantus 10 units QHS, Humalog 5 units TID with meals Current orders for Inpatient glycemic control: Semglee 20 units Q24H, Novolog 0-15 units TID, Novolog 0-5 units QHS, Novolog 8 units TID with meals  Inpatient Diabetes Program Recommendations:    Insulin: Noted Semglee increased from 15 to 20 units Q24H which patient received Semglee 20 units today at 5:36 am.  Please consider increasing meal coverage to Novolog 10 units TID with meals and decreasing Novolog correction to 0-9 units TID with meals.  Thanks, Barnie Alderman, RN, MSN, [place] Diabetes Coordinator Inpatient Diabetes Program [phone] (Team Pager from 8am to [place])

## 2021-08-21 NOTE — Plan of Care (Deleted)
  Problem: Coping: Goal: Ability to adjust to condition or change in health will improve Outcome: Not Progressing   Problem: Nutritional: Goal: Maintenance of adequate nutrition will improve Outcome: Not Progressing   Problem: Metabolic: Goal: Ability to maintain appropriate glucose levels will improve Outcome: Not Progressing   Problem: Activity: Goal: Risk for activity intolerance will decrease Outcome: Not Progressing   Problem: Nutrition: Goal: Adequate nutrition will be maintained Outcome: Not Progressing

## 2021-08-22 DIAGNOSIS — E871 Hypo-osmolality and hyponatremia: Secondary | ICD-10-CM

## 2021-08-22 DIAGNOSIS — E1110 Type 2 diabetes mellitus with ketoacidosis without coma: Secondary | ICD-10-CM | POA: Diagnosis not present

## 2021-08-22 DIAGNOSIS — F418 Other specified anxiety disorders: Secondary | ICD-10-CM | POA: Diagnosis not present

## 2021-08-22 DIAGNOSIS — Z8673 Personal history of transient ischemic attack (TIA), and cerebral infarction without residual deficits: Secondary | ICD-10-CM | POA: Diagnosis not present

## 2021-08-22 DIAGNOSIS — I10 Essential (primary) hypertension: Secondary | ICD-10-CM | POA: Diagnosis not present

## 2021-08-22 HISTORY — DX: Hypo-osmolality and hyponatremia: E87.1

## 2021-08-22 LAB — GLUCOSE, CAPILLARY
Glucose-Capillary: 539 mg/dL (ref 70–99)
Glucose-Capillary: 162 mg/dL — ABNORMAL HIGH (ref 70–99)
Glucose-Capillary: 131 mg/dL — ABNORMAL HIGH (ref 70–99)
Glucose-Capillary: 17 mg/dL — CL (ref 70–99)
Glucose-Capillary: 280 mg/dL — ABNORMAL HIGH (ref 70–99)
Glucose-Capillary: 301 mg/dL — ABNORMAL HIGH (ref 70–99)
Glucose-Capillary: 318 mg/dL — ABNORMAL HIGH (ref 70–99)
Glucose-Capillary: 34 mg/dL — CL (ref 70–99)
Glucose-Capillary: 35 mg/dL — CL (ref 70–99)
Glucose-Capillary: 311 mg/dL — ABNORMAL HIGH (ref 70–99)
Glucose-Capillary: 188 mg/dL — ABNORMAL HIGH (ref 70–99)
Glucose-Capillary: 200 mg/dL — ABNORMAL HIGH (ref 70–99)

## 2021-08-22 LAB — BASIC METABOLIC PANEL
Anion gap: 9 (ref 5–15)
BUN: 18 mg/dL (ref 8–23)
CO2: 24 mmol/L (ref 22–32)
Calcium: 9.2 mg/dL (ref 8.9–10.3)
Chloride: 97 mmol/L — ABNORMAL LOW (ref 98–111)
Creatinine, Ser: 0.81 mg/dL (ref 0.44–1.00)
GFR, Estimated: >60 mL/min (ref >60)
Glucose, Bld: 529 mg/dL (ref 70–99)
Potassium: 5.9 mmol/L — ABNORMAL HIGH (ref 3.5–5.1)
Sodium: 130 mmol/L — ABNORMAL LOW (ref 135–145)

## 2021-08-22 LAB — POTASSIUM: Potassium: 5.1 mmol/L (ref 3.5–5.1)

## 2021-08-22 LAB — GLUCOSE, RANDOM: Glucose, Bld: 44 mg/dL — CL (ref 70–99)

## 2021-08-22 MED ORDER — SODIUM POLYSTYRENE SULFONATE 15 GM/60ML PO SUSP
30.0000 g | Freq: Once | ORAL | Status: DC
  Filled 2021-08-22: qty 120

## 2021-08-22 MED ORDER — GLUCOSE 40 % PO GEL
ORAL | Status: AC
  Filled 2021-08-22: qty 1

## 2021-08-22 MED ORDER — GLUCAGON HCL RDNA (DIAGNOSTIC) 1 MG IJ SOLR: 0.5000 mg | INTRAMUSCULAR | Status: AC

## 2021-08-22 MED ORDER — INSULIN ASPART 100 UNIT/ML IJ SOLN
5.0000 [IU] | Freq: Three times a day (TID) | INTRAMUSCULAR | Status: DC
  Administered 2021-08-22 – 2021-08-24 (×5): 5 [IU] via SUBCUTANEOUS
  Filled 2021-08-22 (×4): qty 1

## 2021-08-22 MED ORDER — INSULIN ASPART 100 UNIT/ML IJ SOLN
15.0000 [IU] | Freq: Once | INTRAMUSCULAR | Status: AC
  Administered 2021-08-22: 15 [IU] via SUBCUTANEOUS
  Filled 2021-08-22: qty 1

## 2021-08-22 MED ORDER — DEXTROSE 50 % IV SOLN
INTRAVENOUS | Status: AC
  Administered 2021-08-22: 50 mL
  Filled 2021-08-22: qty 50

## 2021-08-22 MED ORDER — MIDODRINE HCL 5 MG PO TABS
5.0000 mg | ORAL_TABLET | Freq: Three times a day (TID) | ORAL | Status: DC
  Administered 2021-08-22 – 2021-08-23 (×3): 5 mg via ORAL
  Filled 2021-08-22 (×3): qty 1

## 2021-08-22 MED ORDER — GLUCAGON HCL RDNA (DIAGNOSTIC) 1 MG IJ SOLR
INTRAMUSCULAR | Status: AC
  Administered 2021-08-22: 0.5 mg via INTRAMUSCULAR
  Filled 2021-08-22: qty 1

## 2021-08-22 MED ORDER — SODIUM CHLORIDE 0.9 % IV SOLN: INTRAVENOUS | Status: DC

## 2021-08-22 MED ORDER — INSULIN GLARGINE-YFGN 100 UNIT/ML ~~LOC~~ SOLN
10.0000 [IU] | Freq: Every day | SUBCUTANEOUS | Status: DC
  Administered 2021-08-23: 10 [IU] via SUBCUTANEOUS
  Filled 2021-08-22 (×2): qty 0.1

## 2021-08-22 MED ORDER — SODIUM POLYSTYRENE SULFONATE 15 GM/60ML PO SUSP
30.0000 g | Freq: Once | ORAL | Status: AC
  Administered 2021-08-22: 30 g via ORAL
  Filled 2021-08-22: qty 120

## 2021-08-22 NOTE — Progress Notes (Signed)
       CROSS COVER NOTE  NAME: Brittany [name] MRN: [id number] DOB : Feb 17, 1960    Date of Service   08/22/2021  HPI/Events of Note   Secure chat received from nursing reporting a CBG of 539. This is significantly increased from 102 at 2221 last night. Overnight she ate crackers and had a Glucerna. Patient is asymptomatic.  Interventions   Plan: STAT Lab Confirmation- CBG 529; Anion gap 9 15U of Novolog       This document was prepared using Dragon voice recognition software and may include unintentional dictation errors.  Neomia Glass DNP, MHA, FNP-BC Nurse Practitioner [facility] Pager [phone]

## 2021-08-22 NOTE — Significant Event (Signed)
During rounding check Pryor Curia went to see patient and found patient to be very hard to arouse and very sweaty.  CBG was checked and found to be 17.  Pt was aroused enough to drink 8 oz of orange juice and Dr. Kurtis Bushman was called as well.  Pt was without IV so MD ordered to give more juice and crackers when pt becomes more awake.  Rapid response nurse was called shortly after d/t pt becoming lethargic.  8 oz juice was able to be given again, RR nurse gave glucagon IM.  CBG checked and resulted 35, glucose gel was also given.  IV was able to be established so D50 was then given.  CBG checked afterwards resulted 301.  Pt started to become more aroused and was able to talk with staff.  Once fully awake pt was given a sandwich tray to help prevent glucose from crashing.  MD notified of new result.  Will continue to monitor CBG post hypoglycemic episode to prevent glucose from dropping.

## 2021-08-22 NOTE — Consult Note (Addendum)
[place] Nurse ostomy consult note Stoma type/location:  Pt is familiar to [facility] team from previous visits.  Pt states her daughter performs pouch changes when at home.  Stomal assessment/size: Stoma is red and viable, above skin level, 1 1/2 inches. Pt had medication given previously, according to bedside nurse, which is causing her to have a large amt liquid stools.  Current pouch has leaked a large amt liquid brown stool and patient is soiled all over body.  Peristomal assessment: intact Ostomy pouching: 2pc.  Bathed patient and applied 2 piece, high output pouch and barrier ring to attempt to maintain a seal.  Unfortunately, this high output pouch will not connect with a bedside drainage bag. Applied Tegaderm around edges. Supplies ordered to the bedside for staff nurses use as follows: barrier ring, Kellie Simmering # [phone], wafer Kellie Simmering # 2, pouch Lawson # [phone] Instructed staff to check or empty pouch Q hour to avoid overfilling, directions for pouch application provided. Please re-consult if further assistance is needed.  Thank-you,  Julien Girt MSN, [address], [place], [place], [place]

## 2021-08-22 NOTE — Significant Event (Signed)
Rapid Response Event Note   Reason for Call :  Symptomatic Low blood sugar  Initial Focused Assessment:  Rapid response RN arrived in patient's room with patient surrounded by 1A staff. Some 1A staff working on placing IV, some had just arrived with some glucose gel. Patient was lying in bed and not arousing to pain of IV attempts at time of rapid response RN arrival.  Interventions:  Obtained glucagon from 1A pxyis since patient was not arousable, given right deltoid. CBG still in 30s but patient aroused enough to speak and took packet of glucose gel and then drank roughly 8 oz of orange juice. Patient would arouse but as soon as directed activity completed (say your name, drink this juice, etc..) she would appear to fall back asleep. This RN placed IV in right forearm and administered amp of D50 (50 mL/25g) since CBG was still in 30s. After about a minute post administration, patient sat up in bed and stayed consistently awake. CBG 301.  Plan of Care:  Patient to remain on 1A for now. Patient provided a sandwich tray by 1A staff to help patient get something more than emergency glucose and orange juice to help maintain her sugar. Patient voiced great hunger and was quickly consuming tray at time of rapid response end. Patient's nurse in communication with Dr. Kurtis Bushman about how to better stabilize blood sugars. CBG at time of end of rapid response 280.  Event Summary:   MD Notified: Dr. Kurtis Bushman Call Time: 14:32 Arrival Time: 14:34 End Time: 15:08  Stephanie Acre, RN

## 2021-08-22 NOTE — Progress Notes (Signed)
PROGRESS NOTE    Brittany [name]  GGY:[id number] DOB: June 08, 1960 DOA: 08/13/2021 PCP: Pcp, No    Brief Narrative:  Taken from prior notes.  ICU transfer.   [name], is a 61 year old female with history of hypertension, poorly controlled insulin-dependent diabetes mellitus, history of left PCA territory infarct, right hemiparesis, COPD, history of pancreectomy/splenectomy, history of diverticulitis with abscess leading to colostomy in January 2023, anxiety, depression, who presented to [facility] ER via EMS on 07/2 with altered mental status.  Pts daughter reported to EMS the pt was "fine" at 08:30 am today, however she later found her on the floor prompting EMS notification.  Per EMS run sheet upon their arrival at pts home she was found pale, cold, and clammy.  Her CBG readings were 442 and EMS was unable to obtain a temperature.  Pt placed on 6L nasal canula.  En route to the ER code sepsis initiated and pt received 500 ml iv fluid bolus. Pt remained altered and placed on 15L NRB.  She was recently hospitalized between 06/14 to 06/16 following treatment of CAP and DKA.     ED Course In the ER pt alert but disoriented.  ED vital signs were: bp 74/64, hr 105, resp rate 13, pt hypothermic, and O2 sats were 100%.  Sepsis protocol initiated: pt received cefepime, flagyl, vancomycin, 2.5 LR fluid bolus.  Labs were obtained.   CMP: Na+ 131, CO2 <7, glucose 592, BUN 28, creatinine 1.38, anion gap not calculated, albumin 2.6, total bilirubin 1.6, lactic acid 3.7, and pct 0.11 CBC: WBC 13.0 and Hgb 11.0 CXR: Increased right lower lobe infiltrate, suspicious for pneumonia .   Admitted with PCCM for concern of DKA and sepsis with septic shock secondary to pneumonia. Patient initially received broad-spectrum antibiotics with Zosyn, vancomycin and Flagyl under sepsis protocol and then transitioned to Unasyn for concern of aspiration pneumonia. Patient also required pressors due to insufficient response to  IV fluid.   7/4: Patient now hemodynamically stable.  Off the pressors.  Insulin infusion was switched with basal and short-acting.  Care is being transitioned to [facility].  Mild hypokalemia and hypophosphatemia which is being repleted.   7/5: Remained hemodynamically stable.  Awaiting placement.  Ordered removal of central line. PT is recommending SNF-TOC is working on it  7/7 PCCM pickup  7/10 potassium 6.1 , then stat repeat 5.5. ekg no changes.  7/11 pt was ordering extra food and noticed bg elevated, was >500. Once we told her not to order extra food, her bg dropped to 17>>given iv d50, glucose,extra food, now 300. Insulin and novolog adjusted.    Consultants:  pccm  Procedures:   Antimicrobials:      Subjective: No cp or sob. No complaints  Objective: Vitals:   08/22/21 0402 08/22/21 0500 08/22/21 0738 08/22/21 1544  BP: 123/90  99/78 (!) 90/57  Pulse: 94  95 74  Resp: 17  16   Temp: 98.5 F (36.9 C)  98.5 F (36.9 C) (!) 97.3 F (36.3 C)  TempSrc:   Oral   SpO2: 100%  99% 98%  Weight:  42.1 kg      Intake/Output Summary (Last 24 hours) at 08/22/2021 1706 Last data filed at 08/22/2021 1000 Gross per 24 hour  Intake 477 ml  Output 2201 ml  Net -1724 ml   Filed Weights   08/18/21 0500 08/21/21 0500 08/22/21 0500  Weight: 45 kg 41.5 kg 42.1 kg    Examination: Calm, NAD, tired this am. Cta  no w/r Reg s1/s2 no gallop Soft benign +bs No edema Grossly intact Mood and affect appropriate in current setting     Data Reviewed: I have personally reviewed following labs and imaging studies  CBC: Recent Labs  Lab 08/16/21 0523 08/17/21 0358 08/21/21 0526  WBC 6.9 5.6 8.1  NEUTROABS 3.0 2.1  --   HGB 10.2* 10.6* 10.4*  HCT 30.3* 33.0* 33.5*  MCV 83.2 84.4 89.1  PLT 363 328 283   Basic Metabolic Panel: Recent Labs  Lab 08/16/21 0523 08/17/21 0358 08/18/21 0518 08/19/21 0905 08/21/21 0526 08/21/21 0909 08/22/21 0533 08/22/21 1438  NA 138 136 132*  132* 131*  --  130*  --   K 3.4* 4.5 5.0 4.7 6.1* 5.5* 5.9*  --   CL 100 99 99 96* 103  --  97*  --   CO2 '30 27 28 24 '$ 21*  --  24  --   GLUCOSE 142* 204* 423* 241* 270*  --  529* 44*  BUN 7* '10 13 10 18  '$ --  18  --   CREATININE 0.53 0.75 0.79 0.77 0.63  --  0.81  --   CALCIUM 8.8* 9.1 9.0 9.4 8.8*  --  9.2  --   MG 1.8 2.4  --   --   --   --   --   --   PHOS 2.6 3.3  --   --   --   --   --   --    GFR: Estimated Creatinine Clearance: 48.5 mL/min (by C-G formula based on SCr of 0.81 mg/dL). Liver Function Tests: No results for input(s): "AST", "ALT", "ALKPHOS", "BILITOT", "PROT", "ALBUMIN" in the last 168 hours.  No results for input(s): "LIPASE", "AMYLASE" in the last 168 hours. No results for input(s): "AMMONIA" in the last 168 hours. Coagulation Profile: No results for input(s): "INR", "PROTIME" in the last 168 hours.  Cardiac Enzymes: No results for input(s): "CKTOTAL", "CKMB", "CKMBINDEX", "TROPONINI" in the last 168 hours.  BNP (last 3 results) No results for input(s): "PROBNP" in the last 8760 hours. HbA1C: No results for input(s): "HGBA1C" in the last 72 hours. CBG: Recent Labs  Lab 08/22/21 1443 08/22/21 1453 08/22/21 1506 08/22/21 1522 08/22/21 1611  GLUCAP 35* 301* 280* 318* 311*   Lipid Profile: No results for input(s): "CHOL", "HDL", "LDLCALC", "TRIG", "CHOLHDL", "LDLDIRECT" in the last 72 hours. Thyroid Function Tests: No results for input(s): "TSH", "T4TOTAL", "FREET4", "T3FREE", "THYROIDAB" in the last 72 hours. Anemia Panel: No results for input(s): "VITAMINB12", "FOLATE", "FERRITIN", "TIBC", "IRON", "RETICCTPCT" in the last 72 hours. Sepsis Labs: No results for input(s): "PROCALCITON", "LATICACIDVEN" in the last 168 hours.   Recent Results (from the past 240 hour(s))  Resp Panel by RT-PCR (Flu A&B, Covid) Anterior Nasal Swab     Status: None   Collection Time: 08/13/21 11:45 AM   Specimen: Anterior Nasal Swab  Result Value Ref Range Status   SARS  Coronavirus 2 by RT PCR NEGATIVE NEGATIVE Final    Comment: (NOTE) SARS-CoV-2 target nucleic acids are NOT DETECTED.  The SARS-CoV-2 RNA is generally detectable in upper respiratory specimens during the acute phase of infection. The lowest concentration of SARS-CoV-2 viral copies this assay can detect is 138 copies/mL. A negative result does not preclude SARS-Cov-2 infection and should not be used as the sole basis for treatment or other patient management decisions. A negative result may occur with  improper specimen collection/handling, submission of specimen other than nasopharyngeal swab, presence of  viral mutation(s) within the areas targeted by this assay, and inadequate number of viral copies(<138 copies/mL). A negative result must be combined with clinical observations, patient history, and epidemiological information. The expected result is Negative.  Fact Sheet for Patients:  EntrepreneurPulse.com.au  Fact Sheet for Healthcare Providers:  IncredibleEmployment.be  This test is no t yet approved or cleared by the [place] FDA and  has been authorized for detection and/or diagnosis of SARS-CoV-2 by FDA under an Emergency Use Authorization (EUA). This EUA will remain  in effect (meaning this test can be used) for the duration of the COVID-19 declaration under Section 564(b)(1) of the Act, 21 U.S.C.section 360bbb-3(b)(1), unless the authorization is terminated  or revoked sooner.       Influenza A by PCR NEGATIVE NEGATIVE Final   Influenza B by PCR NEGATIVE NEGATIVE Final    Comment: (NOTE) The Xpert Xpress SARS-CoV-2/FLU/RSV plus assay is intended as an aid in the diagnosis of influenza from Nasopharyngeal swab specimens and should not be used as a sole basis for treatment. Nasal washings and aspirates are unacceptable for Xpert Xpress SARS-CoV-2/FLU/RSV testing.  Fact Sheet for  Patients: EntrepreneurPulse.com.au  Fact Sheet for Healthcare Providers: IncredibleEmployment.be  This test is not yet approved or cleared by the [place] FDA and has been authorized for detection and/or diagnosis of SARS-CoV-2 by FDA under an Emergency Use Authorization (EUA). This EUA will remain in effect (meaning this test can be used) for the duration of the COVID-19 declaration under Section 564(b)(1) of the Act, 21 U.S.C. section 360bbb-3(b)(1), unless the authorization is terminated or revoked.  Performed at [facility], [address] [place], [place] [address]   Blood Culture (routine x 2)     Status: None   Collection Time: 08/13/21 11:45 AM   Specimen: BLOOD  Result Value Ref Range Status   Specimen Description BLOOD  Final   Special Requests   Final    BOTTLES DRAWN AEROBIC ONLY Blood Culture adequate volume   Culture   Final    NO GROWTH 5 DAYS Performed at [facility], [address] [place], [place] [address]    Report Status 08/18/2021 FINAL  Final  Urine Culture     Status: None   Collection Time: 08/13/21 11:45 AM   Specimen: In/Out Cath Urine  Result Value Ref Range Status   Specimen Description   Final    IN/OUT CATH URINE Performed at [facility], [address], [place], [place] [address]    Special Requests   Final    NONE Performed at [facility], [address] [place], [place] [address]    Culture   Final    NO GROWTH Performed at [facility], [place] [address], [place], [place] [address]    Report Status 08/14/2021 FINAL  Final  Gastrointestinal Panel by PCR , Stool     Status: None   Collection Time: 08/13/21  1:50 PM   Specimen: Stool  Result Value Ref Range Status   Campylobacter species NOT DETECTED NOT DETECTED Final   Plesimonas shigelloides NOT DETECTED NOT DETECTED Final   Salmonella species NOT DETECTED NOT DETECTED Final   Yersinia  enterocolitica NOT DETECTED NOT DETECTED Final   Vibrio species NOT DETECTED NOT DETECTED Final   Vibrio cholerae NOT DETECTED NOT DETECTED Final   Enteroaggregative E coli (EAEC) NOT DETECTED NOT DETECTED Final   Enteropathogenic E coli (EPEC) NOT DETECTED NOT DETECTED Final   Enterotoxigenic E coli (ETEC)  NOT DETECTED NOT DETECTED Final   Shiga like toxin producing E coli (STEC) NOT DETECTED NOT DETECTED Final   Shigella/Enteroinvasive E coli (EIEC) NOT DETECTED NOT DETECTED Final   Cryptosporidium NOT DETECTED NOT DETECTED Final   Cyclospora cayetanensis NOT DETECTED NOT DETECTED Final   Entamoeba histolytica NOT DETECTED NOT DETECTED Final   Giardia lamblia NOT DETECTED NOT DETECTED Final   Adenovirus F40/41 NOT DETECTED NOT DETECTED Final   Astrovirus NOT DETECTED NOT DETECTED Final   Norovirus GI/GII NOT DETECTED NOT DETECTED Final   Rotavirus A NOT DETECTED NOT DETECTED Final   Sapovirus (I, II, IV, and V) NOT DETECTED NOT DETECTED Final    Comment: Performed at [facility], [address] [place], [place] [address]  C Difficile Quick Screen w PCR reflex     Status: None   Collection Time: 08/13/21  1:50 PM   Specimen: Stool  Result Value Ref Range Status   C Diff antigen NEGATIVE NEGATIVE Final   C Diff toxin NEGATIVE NEGATIVE Final   C Diff interpretation No C. difficile detected.  Final    Comment: Performed at [facility], [address] [place], [place] [address]  MRSA Next Gen by PCR, Nasal     Status: None   Collection Time: 08/13/21  5:30 PM   Specimen: Nasal Mucosa; Nasal Swab  Result Value Ref Range Status   MRSA by PCR Next Gen NOT DETECTED NOT DETECTED Final    Comment: (NOTE) The GeneXpert MRSA Assay (FDA approved for NASAL specimens only), is one component of a comprehensive MRSA colonization surveillance program. It is not intended to diagnose MRSA infection nor to guide or monitor treatment for MRSA infections. Test performance is not  FDA approved in patients less than 32 years old. Performed at [facility], [address] [place], [place] [address]   Culture, blood (Routine X 2) w Reflex to ID Panel     Status: None   Collection Time: 08/14/21  1:52 AM   Specimen: BLOOD  Result Value Ref Range Status   Specimen Description BLOOD LEFT HAND  Final   Special Requests   Final    BOTTLES DRAWN AEROBIC AND ANAEROBIC Blood Culture adequate volume   Culture   Final    NO GROWTH 5 DAYS Performed at [facility], [address], [place], [place] [address]    Report Status 08/19/2021 FINAL  Final         Radiology Studies: No results found.      Scheduled Meds:  aspirin EC  81 mg Oral Daily   atorvastatin  40 mg Oral QHS   Chlorhexidine Gluconate Cloth  6 each Topical Daily   enoxaparin (LOVENOX) injection  30 mg Subcutaneous QHS   famotidine  20 mg Oral Daily   feeding supplement (GLUCERNA SHAKE)  237 mL Oral TID BM   insulin aspart  0-15 Units Subcutaneous TID WC   insulin aspart  0-5 Units Subcutaneous QHS   insulin aspart  5 Units Subcutaneous TID WC   [START ON 08/23/2021] insulin glargine-yfgn  10 Units Subcutaneous Q0600   lipase/protease/amylase  36,000 Units Oral TID WC   midodrine  2.5 mg Oral TID WC   mirtazapine  15 mg Oral QHS   multivitamin with minerals  1 tablet Oral Daily   OLANZapine  7.5 mg Oral QHS   rOPINIRole  0.5 mg Oral QHS   sodium polystyrene  30 g Oral Once   Continuous Infusions:  sodium  chloride 10 mL/hr at 08/21/21 0333   sodium chloride Stopped (08/14/21 0300)   sodium chloride      Assessment & Plan:   Principal Problem:   DKA (diabetic ketoacidosis) ([place]) Active Problems:   Severe sepsis ([place])   Essential hypertension   History of stroke   Restless leg syndrome   IPMN (intraductal papillary mucinous neoplasm)   HLD (hyperlipidemia)   Depression with anxiety   Protein-calorie malnutrition, severe   Hyponatremia   DKA (diabetic  ketoacidosis) ([place]) Labile DM-1 Patient admitted with DKA which has been resolved.  -- Insulin infusion has been switched with basal and short-acting. -Continue with SSI and Aspart --diabetes coordinator following --d/w RN not to give extra trays of food--ive snack in between meals 7/11 bg has been elevated due to getting extra food and snacks. BG labile. Decreased lantus and novolog as now became hypoglycemic due to holding her extra food /snacks. Monitor closely Start ivf for hydration   Hyponatremia Corrected sodium due to hyperglycemia is 140 Will start NS ivf due to hyperglycemia Monitor levels    Severe sepsis [facility]) --Patient presented with severe sepsis and septic shock secondary to pneumonia.  Initially required pressors due to insufficient response to IV fluid.  Now off the pressors and blood pressure mildly elevated. --Received broad-spectrum antibiotics and IV fluid per sepsis protocol. --Antibiotics were switched with Unasyn for concern of aspiration pneumonia--change to po augrmentin --sepsis resolved 7/8 bp on low side, symptomatic, gets dizzy on standing.  7/11 bp on low side , start ivf for hydration. Likely due to hyperglycemia today    Essential hypertension -Patient was not on any antihypertensive medications at home. Blood pressure mildly elevated. -If remain elevated we will start her on low-dose losartan 7/8 bp on low side . Hold bp meds.  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  Being managed by oncology as an outpatient. -Continue with Creon   HLD (hyperlipidemia) - Continue with home Lipitor   Depression with anxiety - Restart home Zyprexa and mirtazapine   Protein-calorie malnutrition,  severe Estimated body mass index is 17.22 kg/m as calculated from the following:   Height as of 07/26/21: '5\' 3"'$  (1.6 m).   Weight as of this encounter: 44.1 kg.   -Dietitian consult appreciated   DVT prophylaxis: Lovenox Code Status: Full Family Communication: None at bedside Disposition Plan: snf Status is: Inpatient Remains inpatient appropriate because: IV treatment, snf, potassium elevated, hypoglycemic     LOS: 9 days   Time spent: 51 minutes    Nolberto Hanlon, MD [facility] Pager [phone]  If 7PM-7AM, please contact night-coverage 08/22/2021, 5:06 PM

## 2021-08-22 NOTE — Progress Notes (Signed)
   08/22/21 2449  Notify: Charge Nurse/RN  Name of Charge Nurse/RN Notified Durwin Glaze RN  Date Charge Nurse/RN Notified 08/22/21  Time Charge Nurse/RN Notified [id number]  Notify: Provider  Provider Name/Title Neomia Glass NP  Date Provider Notified 08/22/21  Time Provider Notified [phone]  Method of Notification Page (secure chat)  Notification Reason Critical result;Other (Comment) (cbg 539)  Provider response See new orders  Date of Provider Response 08/22/21  Time of Provider Response [phone]

## 2021-08-22 NOTE — Progress Notes (Signed)
New order from Neomia Glass NP for Labs to collect STAT glucose random and also to give 15 units of Novolog insulin respectively.All orders were administered at this time.will cont to monitor Pt for any changes in condition.

## 2021-08-22 NOTE — Progress Notes (Signed)
Physical Therapy Treatment Patient Details Name: Brittany [name] MRN: [id number] DOB: [birth year] Today's Date: 08/22/2021   History of Present Illness Pt. is a 61 y.o. F presenting to [facility] on 7/2 with AMS. Admitted to SDU with acute metabolic encephalopathy, severe sepsis with septic shock secondary to pneumonia likely aspiration, hypothermia, and DKA. 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    PT Comments    Pt was sitting in recliner upon arriving requesting to ambulate then get back into bed. Pt was able to stand and ambulate without AD however due to impulsivity and poor overall dynamic balance, highly recommend pt use RW at all times when not with PT/OT. Pt states understanding and is agreeable to use RW at DC until balance improves. Pt will benefit from SNF to address deficits balance deficits and safety awareness deficits. Will benefit from 24/7 supervision at DC.    Recommendations for follow up therapy are one component of a multi-disciplinary discharge planning process, led by the attending physician.  Recommendations may be updated based on patient status, additional functional criteria and insurance authorization.  Follow Up Recommendations  Skilled nursing-short term rehab (<3 hours/day)     Assistance Recommended at Discharge Frequent or constant Supervision/Assistance  Patient can return home with the following A little help with walking and/or transfers;A little help with bathing/dressing/bathroom;Direct supervision/assist for medications management;Direct supervision/assist for financial management;Assist for transportation;Help with stairs or ramp for entrance   Equipment Recommendations  Rolling walker (2 wheels)       Precautions / Restrictions Precautions Precautions:  Fall Precaution Comments: colostomy Restrictions Weight Bearing Restrictions: No     Mobility  Bed Mobility Overal bed mobility: Modified Independent     Transfers Overall transfer level: Modified independent   Ambulation/Gait Ambulation/Gait assistance: Min guard Gait Distance (Feet): 200 Feet Assistive device: None Gait Pattern/deviations: Step-through pattern, Drifts right/left, Staggering right, Staggering left, Narrow base of support, Scissoring Gait velocity: decreased     General Gait Details: Pt was able to ambulate 200 ft without AD. does have some unsteadiness without use of AD. Highly recommend pt use AD at all times when not with PT/OT. pt states understanding but will require reinforcement     Balance Overall balance assessment: Needs assistance Sitting-balance support: Feet unsupported, Feet supported, No upper extremity supported Sitting balance-Leahy Scale: Good Sitting balance - Comments: no balance deficits in sitting   Standing balance support: Bilateral upper extremity supported Standing balance-Leahy Scale: Poor Standing balance comment: Poor balance without UE support during dynamic task        Cognition Arousal/Alertness: Awake/alert Behavior During Therapy: Impulsive Overall Cognitive Status: Within Functional Limits for tasks assessed        General Comments: Pleasant and cooperative. Able to follow comamnds               Pertinent Vitals/Pain Pain Assessment Pain Assessment: No/denies pain Pain Score: 0-No pain     PT Goals (current goals can now be found in the care plan section) Acute Rehab PT Goals Patient Stated Goal: go home Progress towards PT goals: Progressing toward goals    Frequency    Min 2X/week      PT Plan Current plan remains appropriate       AM-PAC PT "6 Clicks" Mobility   Outcome Measure  Help needed turning from your back to your side while in a flat  bed without using bedrails?: A Little Help  needed moving from lying on your back to sitting on the side of a flat bed without using bedrails?: A Little Help needed moving to and from a bed to a chair (including a wheelchair)?: A Little Help needed standing up from a chair using your arms (e.g., wheelchair or bedside chair)?: A Little Help needed to walk in hospital room?: A Little Help needed climbing 3-5 steps with a railing? : A Little 6 Click Score: 18    End of Session Equipment Utilized During Treatment: Gait belt Activity Tolerance: Patient tolerated treatment well Patient left: in bed;with call bell/phone within reach;with bed alarm set;with nursing/sitter in room Nurse Communication: Mobility status PT Visit Diagnosis: Muscle weakness (generalized) (M62.81);Difficulty in walking, not elsewhere classified (R26.2)     Time: 1950-9326 PT Time Calculation (min) (ACUTE ONLY): 8 min  Charges:  $Gait Training: 8-22 mins                    Julaine Fusi PTA 08/22/21, 1:44 PM

## 2021-08-22 NOTE — Inpatient Diabetes Management (Signed)
Inpatient Diabetes Program Recommendations  AACE/ADA: New Consensus Statement on Inpatient Glycemic Control   Target Ranges:  Prepandial:   less than 140 mg/dL      Peak postprandial:   less than 180 mg/dL (1-2 hours)      Critically ill patients:  140 - 180 mg/dL    Latest Reference Range & Units 08/22/21 05:12  Glucose-Capillary 70 - 99 mg/dL 539 (HH)    Latest Reference Range & Units 08/21/21 02:10 08/21/21 07:40 08/21/21 11:56 08/21/21 16:26 08/21/21 22:21  Glucose-Capillary 70 - 99 mg/dL 166 (H) 268 (H) 373 (H) 114 (H) 102 (H)   Review of Glycemic Control  Diabetes history: DM1 Outpatient Diabetes medications: Lantus 10 units QHS, Humalog 5 units TID with meals Current orders for Inpatient glycemic control: Semglee 20 units daily, Novolog 0-15 units TID, Novolog 0-5 units QHS, Novolog 10 units TID with meals   Inpatient Diabetes Program Recommendations:     Insulin: CBG 102 at 22:21 last night and 539 mg/dl at 5:12 am today. Per notes in chart, patient ate crackers and drank Glucerna during night. Patient has Type 1 DM and will need insulin for any carbs she consumes since she makes no insulin. May need to order Novolog 0-5 units PRN for snacks (1 unit per 10 grams of carbs).  Also, please consider increasing Semglee to 22 units daily.  Thanks, Barnie Alderman, RN, MSN, [place] Diabetes Coordinator Inpatient Diabetes Program [phone] (Team Pager from 8am to [place])

## 2021-08-22 NOTE — Progress Notes (Signed)
OT Cancellation Note  Patient Details Name: Brittany [name] MRN: [id number] DOB: 1960-02-17   Cancelled Treatment:    Reason Eval/Treat Not Completed: Fatigue/lethargy limiting ability to participate;Other (comment). Upon attempt, RN assessing pt. Reports he is going to check her blood sugar. Will re-attempt OT tx at later date/time.   Ardeth Perfect., MPH, MS, OTR/L ascom [phone] 08/22/21, 2:19 PM

## 2021-08-22 NOTE — Progress Notes (Signed)
   08/22/21 1455  Clinical Encounter Type  Visited With Patient;Health care provider  Visit Type Initial;Other (Comment) (rapid response)  Spiritual Encounters  Spiritual Needs Other (Comment) (support)   Chaplain Gionni Vaca f/u on a rapid response called at 14:32. Pt appeared drowsy but could be roused. Chaplain Atilla Zollner introduced herself to pt and let her know she was present for support. Chaplain checked-in with care team to assess situation and any additional needs; they described pt experiencing labile blood sugar.  Please notify on-call chaplain if supportive presence may be helpful.

## 2021-08-23 DIAGNOSIS — E782 Mixed hyperlipidemia: Secondary | ICD-10-CM | POA: Diagnosis not present

## 2021-08-23 DIAGNOSIS — E1110 Type 2 diabetes mellitus with ketoacidosis without coma: Secondary | ICD-10-CM | POA: Diagnosis not present

## 2021-08-23 DIAGNOSIS — E43 Unspecified severe protein-calorie malnutrition: Secondary | ICD-10-CM | POA: Diagnosis not present

## 2021-08-23 DIAGNOSIS — G2581 Restless legs syndrome: Secondary | ICD-10-CM | POA: Diagnosis not present

## 2021-08-23 LAB — GLUCOSE, CAPILLARY
Glucose-Capillary: 186 mg/dL — ABNORMAL HIGH (ref 70–99)
Glucose-Capillary: 278 mg/dL — ABNORMAL HIGH (ref 70–99)
Glucose-Capillary: 129 mg/dL — ABNORMAL HIGH (ref 70–99)
Glucose-Capillary: 264 mg/dL — ABNORMAL HIGH (ref 70–99)

## 2021-08-23 LAB — BASIC METABOLIC PANEL
Anion gap: 5 (ref 5–15)
BUN: 19 mg/dL (ref 8–23)
CO2: 26 mmol/L (ref 22–32)
Calcium: 9.1 mg/dL (ref 8.9–10.3)
Chloride: 105 mmol/L (ref 98–111)
Creatinine, Ser: 0.84 mg/dL (ref 0.44–1.00)
GFR, Estimated: >60 mL/min (ref >60)
Glucose, Bld: 256 mg/dL — ABNORMAL HIGH (ref 70–99)
Potassium: 5.8 mmol/L — ABNORMAL HIGH (ref 3.5–5.1)
Sodium: 136 mmol/L (ref 135–145)

## 2021-08-23 MED ORDER — SODIUM ZIRCONIUM CYCLOSILICATE 10 G PO PACK
10.0000 g | PACK | Freq: Every day | ORAL | Status: DC
  Administered 2021-08-23 – 2021-08-25 (×3): 10 g via ORAL
  Filled 2021-08-23 (×3): qty 1

## 2021-08-23 NOTE — Care Management Important Message (Signed)
Important Message  Patient Details  Name: Brittany [name] MRN: [id number] Date of Birth: [birth year]   Medicare Important Message Given:  Yes     Juliann Pulse A [name] 08/23/2021, 11:24 AM

## 2021-08-23 NOTE — Progress Notes (Addendum)
[place] at [place] NAME: Brittany [name]    MR#:  [id number]  DATE OF BIRTH:  10/03/1960  SUBJECTIVE:   patient seen earlier. Sitting out in the chair. She appears calm and pleasant. Patient requesting to go home. She had episode of hypoglycemia yesterday. Asking for extra food per RN each shift. No family at bedside.   VITALS:  Blood pressure (!) 135/95, pulse 96, temperature (!) 97.5 F (36.4 C), temperature source Oral, resp. rate 20, weight 42.1 kg, SpO2 99 %.  PHYSICAL EXAMINATION:   GENERAL:  61 y.o.-year-old patient lying in the bed with no acute distress.  LUNGS: Normal breath sounds bilaterally, no wheezing CARDIOVASCULAR: S1, S2 normal. No murmurs  ABDOMEN: Soft, nontender, nondistended. Bowel sounds present.  EXTREMITIES: No  edema b/l.    NEUROLOGIC: nonfocal  patient is alert and awake SKIN: No obvious rash, lesion, or ulcer.   LABORATORY PANEL:  CBC Recent Labs  Lab 08/21/21 0526  WBC 8.1  HGB 10.4*  HCT 33.5*  PLT 370     Chemistries  Recent Labs  Lab 08/17/21 0358 08/18/21 0518 08/23/21 0255  NA 136   < > 136  K 4.5   < > 5.8*  CL 99   < > 105  CO2 27   < > 26  GLUCOSE 204*   < > 256*  BUN 10   < > 19  CREATININE 0.75   < > 0.84  CALCIUM 9.1   < > 9.1  MG 2.4  --   --    < > = values in this interval not displayed.     Assessment and Plan  [name], is a 61 year old female with history of hypertension, poorly controlled insulin-dependent diabetes mellitus, history of left PCA territory infarct, right hemiparesis, COPD, history of pancreectomy/splenectomy, history of diverticulitis with abscess leading to colostomy in January 2023, anxiety, depression, who presented to [facility] ER via EMS on 07/2 with altered mental status.  Pts daughter reported to EMS the pt was "fine" at 08:30 am today, however she later found her on the floor prompting EMS notification.   DKA (diabetic ketoacidosis)  ([place]) Labile DM-1 Patient admitted with DKA which has been resolved.  -- Insulin infusion has been switched with basal and short-acting. -Continue with Semglee,SSI and Aspart --diabetes coordinator following --d/w RN not to give extra trays of food--give snack in between meals -- patient had episode of hypoglycemia yesterday. Sugars have been stable so far.   Severe sepsis [facility]) --Patient presented with severe sepsis and septic shock secondary to pneumonia.  Initially required pressors due to insufficient response to IV fluid.  Now off the pressors and blood pressure mildly elevated. --Received broad-spectrum antibiotics and IV fluid per sepsis protocol. --Antibiotics were switched with Unasyn for concern of aspiration pneumonia-- completed treatment --sepsis resolved   Hyperkalemia -- given lokelma  -check K in am   hypotension relative -- patient is asymptomatic. Systolic blood pressure is more than hundred. 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  Being managed by oncology as an outpatient. -Continue with Creon   HLD (hyperlipidemia) - Continue Lipitor   Depression with anxiety - Restart Zyprexa and mirtazapine   Protein-calorie malnutrition, severe Estimated body mass index is 17.22 kg/m as calculated from the following:   Height as of 07/26/21:  $'5\' 3"'K$  (1.6 m).   Weight as of this encounter: 44.1 kg.   -Dietitian consult appreciated    Procedures:none Family communication :none today Consults :PCCM CODE STATUS: full DVT Prophylaxis :enoxaparin Level of care: Med-Surg Status is: Inpatient Remains inpatient appropriate because: medically improving treating hyperkalemia today. If remains stable will discharged to rehab tomorrow.    TOTAL TIME TAKING CARE OF THIS PATIENT: 35 minutes.  >50% time spent on  counselling and coordination of care  Note: This dictation was prepared with Dragon dictation along with smaller phrase technology. Any transcriptional errors that result from this process are unintentional.  Fritzi Mandes M.D    [facility]   CC: Primary care physician; Pcp, No

## 2021-08-23 NOTE — Plan of Care (Signed)
  Problem: Coping: Goal: Ability to adjust to condition or change in health will improve Outcome: Progressing   Problem: Health Behavior/Discharge Planning: Goal: Ability to manage health-related needs will improve Outcome: Progressing   

## 2021-08-23 NOTE — Progress Notes (Signed)
Occupational Therapy Treatment Patient Details Name: Brittany [name] MRN: [id number] DOB: 1960-12-16 Today's Date: 08/23/2021   History of present illness Pt. is a 60 y.o. F presenting to [facility] on 7/2 with AMS. Admitted to SDU with acute metabolic encephalopathy, severe sepsis with septic shock secondary to pneumonia likely aspiration, hypothermia, and DKA. 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   OT comments  Pt greeted in room, agreeable to OT tx session. Pt is alert and oriented x4, however presents with poor safety awareness and processing throughout ADL tasks. Pt requires supervision-CGA for all mobility, poor balance when not using RW. Pt is progressing towards goals, however would continue to recommend STR to address deficits. OT will continue to follow acutely.    Recommendations for follow up therapy are one component of a multi-disciplinary discharge planning process, led by the attending physician.  Recommendations may be updated based on patient status, additional functional criteria and insurance authorization.    Follow Up Recommendations  Skilled nursing-short term rehab (<3 hours/day)    Assistance Recommended at Discharge Frequent or constant Supervision/Assistance  Patient can return home with the following  Assistance with cooking/housework;Assist for transportation;Help with stairs or ramp for entrance;Direct supervision/assist for medications management;Direct supervision/assist for financial management;A little help with walking and/or transfers;A little help with bathing/dressing/bathroom   Equipment Recommendations  None recommended by OT    Recommendations for Other Services      Precautions / Restrictions Precautions Precautions: Fall Precaution Comments:  colostomy Restrictions Weight Bearing Restrictions: No       Mobility Bed Mobility Overal bed mobility: Modified Independent                  Transfers Overall transfer level: Needs assistance Equipment used: None Transfers: Sit to/from Stand Sit to Stand: Supervision                 Balance Overall balance assessment: Needs assistance Sitting-balance support: Feet unsupported, Feet supported, No upper extremity supported Sitting balance-Leahy Scale: Good     Standing balance support: Bilateral upper extremity supported Standing balance-Leahy Scale: Poor                             ADL either performed or assessed with clinical judgement   ADL                                         General ADL Comments: STS with supervision, amb in room with RW with supervision, step by step vcs for safe use. Amb in room to bathroom without RW with CGA-supervision due to impulsivity. Grooming tasks standing at the sink with supervision, intermittent vcs; Poor safety awareness.    Extremity/Trunk Assessment              Vision       Perception     Praxis      Cognition Arousal/Alertness: Awake/alert Behavior During Therapy: Impulsive Overall Cognitive Status: Within Functional Limits for tasks assessed Area of Impairment: Following commands, Safety/judgement, Awareness, Problem solving                       Following Commands: Follows multi-step commands inconsistently, Follows one step commands consistently Safety/Judgement:  Decreased awareness of safety, Decreased awareness of deficits Awareness: Emergent Problem Solving: Requires verbal cues, Requires tactile cues, Slow processing          Exercises      Shoulder Instructions       General Comments      Pertinent Vitals/ Pain       Pain Assessment Pain Assessment: No/denies pain  Home Living                                           Prior Functioning/Environment              Frequency  Min 2X/week        Progress Toward Goals  OT Goals(current goals can now be found in the care plan section)  Progress towards OT goals: Progressing toward goals     Plan Discharge plan remains appropriate;Frequency remains appropriate    Co-evaluation                 AM-PAC OT "6 Clicks" Daily Activity     Outcome Measure   Help from another person eating meals?: None Help from another person taking care of personal grooming?: A Little Help from another person toileting, which includes using toliet, bedpan, or urinal?: A Little Help from another person bathing (including washing, rinsing, drying)?: A Little Help from another person to put on and taking off regular upper body clothing?: A Little Help from another person to put on and taking off regular lower body clothing?: A Little 6 Click Score: 19    End of Session Equipment Utilized During Treatment: Rolling walker (2 wheels)  OT Visit Diagnosis: Other abnormalities of gait and mobility (R26.89);Muscle weakness (generalized) (M62.81)   Activity Tolerance Patient tolerated treatment well   Patient Left in chair;with call bell/phone within reach   Nurse Communication Mobility status        Time: 1610-9604 OT Time Calculation (min): 14 min  Charges: OT General Charges $OT Visit: 1 Visit OT Treatments $Self Care/Home Management : 8-22 mins  Shanon Payor, OTD OTR/L  08/23/21, 4:49 PM

## 2021-08-24 DIAGNOSIS — G2581 Restless legs syndrome: Secondary | ICD-10-CM | POA: Diagnosis not present

## 2021-08-24 DIAGNOSIS — E43 Unspecified severe protein-calorie malnutrition: Secondary | ICD-10-CM | POA: Diagnosis not present

## 2021-08-24 DIAGNOSIS — E1110 Type 2 diabetes mellitus with ketoacidosis without coma: Secondary | ICD-10-CM | POA: Diagnosis not present

## 2021-08-24 DIAGNOSIS — E782 Mixed hyperlipidemia: Secondary | ICD-10-CM | POA: Diagnosis not present

## 2021-08-24 LAB — GLUCOSE, CAPILLARY
Glucose-Capillary: 251 mg/dL — ABNORMAL HIGH (ref 70–99)
Glucose-Capillary: 325 mg/dL — ABNORMAL HIGH (ref 70–99)
Glucose-Capillary: 37 mg/dL — CL (ref 70–99)
Glucose-Capillary: 131 mg/dL — ABNORMAL HIGH (ref 70–99)
Glucose-Capillary: 217 mg/dL — ABNORMAL HIGH (ref 70–99)
Glucose-Capillary: 387 mg/dL — ABNORMAL HIGH (ref 70–99)

## 2021-08-24 LAB — POTASSIUM
Potassium: 5.7 mmol/L — ABNORMAL HIGH (ref 3.5–5.1)
Potassium: 4.2 mmol/L (ref 3.5–5.1)

## 2021-08-24 MED ORDER — INSULIN GLARGINE-YFGN 100 UNIT/ML ~~LOC~~ SOLN: 12.0000 [IU] | Freq: Every day | SUBCUTANEOUS | 11 refills | Status: DC

## 2021-08-24 MED ORDER — DEXTROSE 50 % IV SOLN
INTRAVENOUS | Status: AC
  Filled 2021-08-24: qty 50

## 2021-08-24 MED ORDER — SODIUM POLYSTYRENE SULFONATE 15 GM/60ML PO SUSP
15.0000 g | Freq: Once | ORAL | Status: DC
  Filled 2021-08-24: qty 60

## 2021-08-24 MED ORDER — DEXTROSE 50 % IV SOLN
25.0000 g | Freq: Once | INTRAVENOUS | Status: AC
Start: 2021-08-24 — End: 2021-08-24
  Administered 2021-08-24: 25 g via INTRAVENOUS
  Filled 2021-08-24: qty 50

## 2021-08-24 MED ORDER — SODIUM POLYSTYRENE SULFONATE 15 GM/60ML PO SUSP
30.0000 g | Freq: Once | ORAL | Status: AC
  Administered 2021-08-24: 30 g via ORAL
  Filled 2021-08-24: qty 120

## 2021-08-24 MED ORDER — FAMOTIDINE 20 MG PO TABS: 20.0000 mg | ORAL_TABLET | Freq: Every day | ORAL | 0 refills | Status: DC

## 2021-08-24 MED ORDER — INSULIN ASPART 100 UNIT/ML IJ SOLN: 0.0000 [IU] | Freq: Every day | INTRAMUSCULAR | 11 refills | Status: DC

## 2021-08-24 MED ORDER — GLUCERNA SHAKE PO LIQD: 237.0000 mL | Freq: Three times a day (TID) | ORAL | 0 refills | Status: DC

## 2021-08-24 MED ORDER — INSULIN ASPART 100 UNIT/ML IJ SOLN: 7.0000 [IU] | Freq: Three times a day (TID) | INTRAMUSCULAR | Status: DC

## 2021-08-24 MED ORDER — DEXTROSE 50 % IV SOLN
25.0000 g | Freq: Once | INTRAVENOUS | Status: AC
Start: 2021-08-24 — End: 2021-08-24

## 2021-08-24 MED ORDER — ADULT MULTIVITAMIN W/MINERALS CH: 1.0000 | ORAL_TABLET | Freq: Every day | ORAL | 0 refills | Status: DC

## 2021-08-24 MED ORDER — INSULIN GLARGINE-YFGN 100 UNIT/ML ~~LOC~~ SOLN: 12.0000 [IU] | Freq: Every day | SUBCUTANEOUS | Status: DC

## 2021-08-24 MED ORDER — INSULIN ASPART 100 UNIT/ML IJ SOLN: 0.0000 [IU] | Freq: Three times a day (TID) | INTRAMUSCULAR | 11 refills | Status: DC

## 2021-08-24 MED ORDER — INSULIN ASPART 100 UNIT/ML IJ SOLN: 5.0000 [IU] | Freq: Three times a day (TID) | INTRAMUSCULAR | Status: DC

## 2021-08-24 MED ORDER — INSULIN ASPART 100 UNIT/ML IJ SOLN: 7.0000 [IU] | Freq: Three times a day (TID) | INTRAMUSCULAR | 11 refills | Status: DC

## 2021-08-24 MED ORDER — INSULIN GLARGINE-YFGN 100 UNIT/ML ~~LOC~~ SOLN
10.0000 [IU] | Freq: Every day | SUBCUTANEOUS | Status: DC
  Administered 2021-08-25: 10 [IU] via SUBCUTANEOUS
  Filled 2021-08-24 (×3): qty 0.1

## 2021-08-24 MED ORDER — INSULIN ASPART 100 UNIT/ML IV SOLN
10.0000 [IU] | Freq: Once | INTRAVENOUS | Status: AC
  Administered 2021-08-24: 10 [IU] via INTRAVENOUS
  Filled 2021-08-24: qty 0.1

## 2021-08-24 NOTE — Inpatient Diabetes Management (Signed)
Inpatient Diabetes Program Recommendations  AACE/ADA: New Consensus Statement on Inpatient Glycemic Control (2015)  Target Ranges:  Prepandial:   less than 140 mg/dL      Peak postprandial:   less than 180 mg/dL (1-2 hours)      Critically ill patients:  140 - 180 mg/dL    Latest Reference Range & Units 08/23/21 08:18 08/23/21 12:26 08/23/21 17:51 08/23/21 21:00  Glucose-Capillary 70 - 99 mg/dL 186 (H)  8 units Novolog  278 (H)  13 units Novolog '@1400'$  129 (H)  7 units Novolog  10 units Semglee 264 (H)  3 units Novolog    (H): Data is abnormally high  Latest Reference Range & Units 08/24/21 08:02 08/24/21 11:19  Glucose-Capillary 70 - 99 mg/dL 251 (H)  10 units Novolog '@0940'$  + 50 ml D50% 325 (H)  16 units Novolog   (H): Data is abnormally high    Home DM Meds:  Lantus 10 units QHS Humalog 5 units TID with meals   Current Orders:  Semglee 10 units daily Novolog 0-15 units TID AC + HS Novolog 5 units TID with meals    MD- Note labile CBGs.  >250 today.  Please consider:  1. Increase Semglee slightly to 12 units Daily  2. Increase Novolog meal coverage slightly to 7 units TID with meals    --Will follow patient during hospitalization--  Wyn Quaker RN, MSN, CDE Diabetes Coordinator Inpatient Glycemic Control Team Team Pager: [phone] (8a-5p)

## 2021-08-24 NOTE — Progress Notes (Signed)
[place] at [place] NAME: Brittany [name]    MR#:  [id number]  DATE OF BIRTH:  09/12/1960  SUBJECTIVE:   patient seen earlier.  No new complaints. Sugars remain labile.  VITALS:  Blood pressure 111/78, pulse (!) 106, temperature 97.6 F (36.4 C), resp. rate 16, weight 42.1 kg, SpO2 100 %.  PHYSICAL EXAMINATION:   GENERAL:  61 y.o.-year-old patient lying in the bed with no acute distress.  LUNGS: Normal breath sounds bilaterally, no wheezing CARDIOVASCULAR: S1, S2 normal. No murmurs  ABDOMEN: Soft, nontender, nondistended. Bowel sounds present. Colostomy+ EXTREMITIES: No  edema b/l.    NEUROLOGIC: nonfocal  patient is alert and awake SKIN: No obvious rash, lesion, or ulcer.   LABORATORY PANEL:  CBC Recent Labs  Lab 08/21/21 0526  WBC 8.1  HGB 10.4*  HCT 33.5*  PLT 370     Chemistries  Recent Labs  Lab 08/23/21 0255 08/24/21 0723 08/24/21 1148  NA 136  --   --   K 5.8*   < > 4.2  CL 105  --   --   CO2 26  --   --   GLUCOSE 256*  --   --   BUN 19  --   --   CREATININE 0.84  --   --   CALCIUM 9.1  --   --    < > = values in this interval not displayed.     Assessment and Plan  [name], is a 61 year old female with history of hypertension, poorly controlled insulin-dependent diabetes mellitus, history of left PCA territory infarct, right hemiparesis, COPD, history of pancreectomy/splenectomy, history of diverticulitis with abscess leading to colostomy in January 2023, anxiety, depression, who presented to [facility] ER via EMS on 07/2 with altered mental status.  Pts daughter reported to EMS the pt was "fine" at 08:30 am today, however she later found her on the floor prompting EMS notification.   DKA (diabetic ketoacidosis) ([place]) Labile DM-1 Patient admitted with DKA which has been resolved.  -- Insulin infusion has been switched with basal and short-acting. -Continue with Semglee,SSI and Aspart --diabetes  coordinator following --d/w RN not to give extra trays of food--give snack in between meals -- patient had episode of hypoglycemia yesterday. Sugars have been stable so far.   Severe sepsis [facility]) --Patient presented with severe sepsis and septic shock secondary to pneumonia.  Initially required pressors due to insufficient response to IV fluid.  Now off the pressors and blood pressure mildly elevated. --Received broad-spectrum antibiotics and IV fluid per sepsis protocol. --Antibiotics were switched with Unasyn for concern of aspiration pneumonia-- completed treatment --sepsis resolved   Hyperkalemia -- given lokelma, insulin, dextrose, cassoulet -- potassium down to 4.2  hypotension relative -- patient is asymptomatic. Systolic blood pressure is more than hundred. 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  Being managed by oncology as an outpatient. -Continue with Creon   HLD (hyperlipidemia) - Continue Lipitor   Depression with anxiety - Restart Zyprexa and mirtazapine   Protein-calorie malnutrition, severe Estimated body mass index is 17.22 kg/m as calculated from the following:   Height as of 07/26/21: '5\' 3"'$  (1.6 m).   Weight as of this encounter: 44.1 kg.   -Dietitian consult appreciated    Procedures:none Family communication :none today Consults :PCCM CODE STATUS: full  DVT Prophylaxis :enoxaparin Level of care: Med-Surg Status is: Inpatient Remains inpatient appropriate because: medically improving insurance authorization pending. It expired. Per TOC resubmitted.    TOTAL TIME TAKING CARE OF THIS PATIENT: 25 minutes.  >50% time spent on counselling and coordination of care  Note: This dictation was prepared with Dragon dictation along with smaller phrase technology. Any transcriptional errors that result  from this process are unintentional.  Fritzi Mandes M.D    [facility]   CC: Primary care physician; Pcp, No

## 2021-08-24 NOTE — Discharge Instructions (Addendum)
Dietitian to follow it rehab. Please do not give extra food trays to patient due to her Brittle diabetes!! Give snack three times a day and at bed time.  Pt to establish PCP in the area

## 2021-08-24 NOTE — Progress Notes (Signed)
OT Cancellation Note  Patient Details Name: Brittany [name] MRN: [id number] DOB: 20-Nov-1960   Cancelled Treatment:    Reason Eval/Treat Not Completed: Other (comment). Upon attempt, pt lying perpendicular in the bed, no ostomy bag. RN came to assess and recommended therapy hold at this time. Will re-attempt at later date/time.   Ardeth Perfect., MPH, MS, OTR/L ascom [phone] 08/24/21, 10:40 AM

## 2021-08-24 NOTE — Plan of Care (Signed)
  Problem: Skin Integrity: Goal: Risk for impaired skin integrity will decrease Outcome: Progressing   Problem: Clinical Measurements: Goal: Will remain free from infection Outcome: Progressing   Problem: Activity: Goal: Risk for activity intolerance will decrease Outcome: Progressing

## 2021-08-24 NOTE — TOC Transition Note (Signed)
Transition of Care [facility]) - CM/SW Discharge Note   Patient Details  Name: Brittany [name] MRN: [id number] Date of Birth: [birth year]  Transition of Care [facility]) CM/SW Contact:  Candie Chroman, LCSW Phone Number: 08/24/2021, 4:15 PM   Clinical Narrative:   Patient has orders to discharge to [facility]. RN will call report to [phone] (Room 602B). Daughter unable to get a ride for her until after 6:00. She is okay with EMS transport and understands there will likely be a bill. EMS transport set up and she is first on the list. No further concerns. CSW signing off.  Final next level of care: Skilled Nursing Facility Barriers to Discharge: Barriers Resolved   Patient Goals and CMS Choice Patient states their goals for this hospitalization and ongoing recovery are:: to go home CMS Medicare.gov Compare Post Acute Care list provided to:: Patient Choice offered to / list presented to : Adult Children  Discharge Placement   Existing PASRR number confirmed : 08/17/21          Patient chooses bed at: [facility] [place] Patient to be transferred to facility by: EMS Name of family member notified: Vicente Males Patient and family notified of of transfer: 08/24/21  Discharge Plan and Services                                     Social Determinants of Health (SDOH) Interventions     Readmission Risk Interventions    03/06/2021    9:13 AM 02/16/2021    2:45 PM  Readmission Risk Prevention Plan  Transportation Screening Complete Complete  Medication Review ([place]) Complete Complete  PCP or Specialist appointment within 3-5 days of discharge Complete   SW Recovery Care/Counseling Consult  Complete  Palliative Care Screening Not Applicable Not [place] Not Applicable Not Applicable

## 2021-08-24 NOTE — Progress Notes (Signed)
Nutrition Follow-up  DOCUMENTATION CODES:   Severe malnutrition in context of chronic illness  INTERVENTION:  Double protein portions with meals Glucerna Shake po TID, each supplement provides 220 kcal and 10 grams of protein Offer snacks between meals Continue MVI with minerals daily  NUTRITION DIAGNOSIS:   Severe Malnutrition related to chronic illness (COPD, pancreatic cancer) as evidenced by severe fat depletion, severe muscle depletion.  Ongoing  GOAL:   Patient will meet greater than or equal to 90% of their needs  Met via meal and nutrition supplements  MONITOR:   PO intake, Supplement acceptance  REASON FOR ASSESSMENT:   Rounds    ASSESSMENT:   61 y.o. 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  Pt sleeping during assessment.   Reviewed chart. Meal completions of 100% x 7 recorded meals. Drinking Glucerna shakes. Spoke with RN. Reports pt is eating well and continues asking for more food despite double protein portions. Glucerna was held this morning d/t hyperkalemia which has since resolved.  Admit wt: 44.1 kg Current wt: 42.1 kg  Medications: pepcid, SSI 0-15 units TID, SSI 0-5 units qhs, SSI 7 units TID, semglee 12 units daily, creon TID, remeron, MVI, lokelma  Labs: potassium 4.2 (wdl), 5.7 (H); CBG's 129-325 x24 hours  UOP: 81m x24 hours  I/O's: +2012 ml since admission  Diet Order:   Diet Order             Diet Carb Modified Fluid  consistency: Thin; Room service appropriate? Yes  Diet effective now                   EDUCATION NEEDS:   Education needs have been addressed  Skin:  Skin Assessment: Reviewed RN Assessment  Last BM:  08/18/21 (400 ml output via ileostomy)  Height:   Ht Readings from Last 1 Encounters:  07/26/21 '5\' 3"'  (1.6 m)    Weight:   Wt Readings from Last 1 Encounters:  08/22/21 42.1 kg    Ideal Body Weight:  52.3 kg  BMI:  Body mass index is 16.44 kg/m.  Estimated Nutritional Needs:   Kcal:  1400-1600kcal/day  Protein:  70-80g/day  Fluid:  1.3-1.5L/day  AClayborne Dana RDN, LDN Clinical Nutrition

## 2021-08-24 NOTE — Progress Notes (Signed)
Daughter called and informed about pt's low blood sugar and that discharge would be canceled today.

## 2021-08-24 NOTE — Progress Notes (Signed)
Hypoglycemic Event  CBG: 37  Treatment: Dextrose 50% intravenous and 4 oz juice PO  Symptoms: Lethargic  Follow-up CBG Time: 1630 CBG Result:217  Possible Reasons for Event: Unknown  Comments/MD notified: MD Posey Pronto  D/C canceled for today  Shylin Keizer Baldemar Friday

## 2021-08-24 NOTE — Discharge Summary (Deleted)
Physician Discharge Summary   [name] MRN: [id number] DOB: 10/05/1960  Admit date:     08/13/2021  Discharge date: 08/24/21  Discharge Physician: Fritzi Mandes   PCP: Pcp, No   Recommendations at discharge:    Dietitian to follow it rehab. Please do not give extra food to patient due to her diabetes. Give snack bid and at bed time. Pt to establish PCP in the area  Discharge Diagnoses: Principal Problem:   DKA (diabetic ketoacidosis) ([place]) Active Problems:   Severe sepsis ([place])   Essential hypertension   History of stroke   Restless leg syndrome   IPMN (intraductal papillary mucinous neoplasm)   HLD (hyperlipidemia)   Depression with anxiety   Protein-calorie malnutrition, severe   Hyponatremia   Hospital Course: Brittany [name], is a 61 year old female with history of hypertension, poorly controlled insulin-dependent diabetes mellitus, history of left PCA territory infarct, right hemiparesis, COPD, history of pancreectomy/splenectomy, history of diverticulitis with abscess leading to colostomy in January 2023, anxiety, depression, who presented to [facility] ER via EMS on 07/2 with altered mental status.  Pts daughter reported to EMS the pt was "fine" at 08:30 am today, however she later found her on the floor prompting EMS notification.    DKA (diabetic ketoacidosis) ([place]) Labile DM-1 Patient admitted with DKA which has been resolved.  -- Insulin infusion has been switched with basal and short-acting. -Continue with Semglee,SSI and Aspart --diabetes coordinator following --d/w RN not to give extra trays of food--give snack in between meals -- Sugars remain labile   Severe sepsis ([place]) --Patient presented with severe sepsis and septic shock secondary to pneumonia.  Initially required pressors due to insufficient response to IV fluid.  Now off the pressors and blood pressure mildly elevated. --Received broad-spectrum antibiotics and IV fluid per sepsis  protocol. --Antibiotics were switched with Unasyn for concern of aspiration pneumonia-- completed treatment --sepsis resolved   Hyperkalemia -- given lokelma, insulin, dextrose,kayexalate -- potassium down to 4.2   hypotension relative -- patient is asymptomatic. Systolic blood pressure is more than hundred. Will hold off midodrine   History of stroke - Continue home aspirin and Lipitor   Restless leg syndrome -Restart home Requip   IPMN (intraductal papillary mucinous neoplasm) -S/p pancreatectomy.  Being managed by oncology as an outpatient. -Continue with Creon   HLD (hyperlipidemia) - Continue Lipitor   Depression with anxiety - Restart Zyprexa and mirtazapine   Protein-calorie malnutrition, severe Estimated body mass index is 17.22 kg/m as calculated from the following:   Height as of 07/26/21: '5\' 3"'$  (1.6 m).   Weight as of this encounter: 44.1 kg.   -Dietitian consult appreciated     Family communication :dter aware of d/c plans Consults :PCCM CODE STATUS: full DVT Prophylaxis :enoxaparin      Disposition: Skilled nursing facility Diet recommendation:  Discharge Diet Orders (From admission, onward)     Start     Ordered   08/24/21 0000  Diet Carb Modified        08/24/21 1357           Carb modified diet DISCHARGE MEDICATION: Allergies as of 08/24/2021       Reactions   Cephalosporins Itching   TOLERATED ZOSYN (PIPERACILLIN) BEFORE   Hydromorphone Itching   Keflin [cephalothin] Itching   Lactose Intolerance (gi) Diarrhea        Medication List     STOP taking these medications    diclofenac Sodium 1 % Gel Commonly known as: VOLTAREN  insulin lispro 100 UNIT/ML KwikPen Commonly known as: HumaLOG KwikPen   Insulin Pen Needle 32G X 4 MM Misc   Lantus SoloStar 100 UNIT/ML Solostar Pen Generic drug: insulin glargine   melatonin 3 MG Tabs tablet   Pancrelipase (Lip-Prot-Amyl) 24000-76000 units Cpep   polyethylene glycol 17 g  packet Commonly known as: MIRALAX / GLYCOLAX       TAKE these medications    albuterol 108 (90 Base) MCG/ACT inhaler Commonly known as: VENTOLIN HFA Inhale 2 puffs into the lungs every 6 (six) hours as needed for wheezing or shortness of breath.   aspirin EC 81 MG tablet Take 1 tablet (81 mg total) by mouth daily. Swallow whole.   atorvastatin 40 MG tablet Commonly known as: LIPITOR Take 1 tablet (40 mg total) by mouth Nightly. What changed: when to take this   camphor-menthol lotion Commonly known as: SARNA Apply topically as needed for itching. What changed: how much to take   famotidine 20 MG tablet Commonly known as: PEPCID Take 1 tablet (20 mg total) by mouth daily. Start taking on: August 25, 2021   feeding supplement (GLUCERNA SHAKE) Liqd Take 237 mLs by mouth 3 (three) times daily between meals.   insulin aspart 100 UNIT/ML injection Commonly known as: novoLOG Inject 0-5 Units into the skin at bedtime.   insulin aspart 100 UNIT/ML injection Commonly known as: novoLOG Inject 0-15 Units into the skin 3 (three) times daily with meals.   insulin aspart 100 UNIT/ML injection Commonly known as: novoLOG Inject 7 Units into the skin 3 (three) times daily with meals.   insulin glargine-yfgn 100 UNIT/ML injection Commonly known as: SEMGLEE Inject 0.12 mLs (12 Units total) into the skin daily at 6 (six) AM. Start taking on: August 25, 2021   mirtazapine 15 MG tablet Commonly known as: REMERON Take 15 mg by mouth at bedtime.   multivitamin with minerals Tabs tablet Take 1 tablet by mouth daily. Start taking on: August 25, 2021   OLANZapine 7.5 MG tablet Commonly known as: ZYPREXA Take 1 tablet (7.5 mg total) by mouth at bedtime.   rOPINIRole 0.5 MG tablet Commonly known as: REQUIP Take 0.5 mg by mouth at bedtime.        Contact information for after-discharge care     Destination     [facility] Preferred SNF .   Service: Skilled  Nursing Contact information: [address] [place] [phone]                    Discharge Exam: Danley Danker Weights   08/18/21 0500 08/21/21 0500 08/22/21 0500  Weight: 45 kg 41.5 kg 42.1 kg     Condition at discharge: fair  The results of significant diagnostics from this hospitalization (including imaging, microbiology, ancillary and laboratory) are listed below for reference.   Imaging Studies: CT HEAD WO CONTRAST (5MM)  Result Date: 08/13/2021 CLINICAL DATA:  61 y.o. female patient with history of diabetes and recent pneumonia. She comes in alert and disoriented and moaning. She is cold to the touch. She seems septic. No obvious source. Blood pressure initially 74/64 with a heart rate of 105 temperature of 82. Reportedly patient was found on the floor called by her daughter EXAM: CT HEAD WITHOUT CONTRAST TECHNIQUE: Contiguous axial images were obtained from the base of the skull through the vertex without intravenous contrast. RADIATION DOSE REDUCTION: This exam was performed according to the departmental dose-optimization program which includes automated exposure control, adjustment  of the mA and/or kV according to patient size and/or use of iterative reconstruction technique. COMPARISON:  07/26/2021 and 06/26/2018 FINDINGS: Brain: No evidence of acute infarction, hemorrhage, hydrocephalus, extra-axial collection or mass lesion/mass effect. Old infarcts, left temporal occipital lobe, left cerebellum and small infarct in right frontal lobe, stable. Patchy white matter hypoattenuation noted more diffusely consistent with moderate chronic microvascular ischemic change. Vascular: No hyperdense vessel or unexpected calcification. Skull: Normal. Negative for fracture or focal lesion. Sinuses/Orbits: Globes and orbits are unremarkable. Visualized sinuses are clear. Other: None. IMPRESSION: 1. No acute intracranial abnormalities. 2. Old infarcts and chronic  microvascular ischemic change. Stable appearance from the prior study. Electronically Signed   By: Lajean Manes M.D.   On: 08/13/2021 16:19   DG Chest Port 1 View  Result Date: 08/13/2021 CLINICAL DATA:  Pneumonia.  Sepsis. EXAM: PORTABLE CHEST 1 VIEW COMPARISON:  07/26/2021 FINDINGS: The heart size and mediastinal contours are within normal limits. Pulmonary hyperinflation is again noted. Increased infiltrate is seen in the right lower lobe. No evidence of pleural effusion. IMPRESSION: Increased right lower lobe infiltrate, suspicious for pneumonia. Electronically Signed   By: Marlaine Hind M.D.   On: 08/13/2021 14:27   CT ABDOMEN PELVIS WO CONTRAST  Result Date: 07/26/2021 CLINICAL DATA:  Abdominal pain EXAM: CT ABDOMEN AND PELVIS WITHOUT CONTRAST TECHNIQUE: Multidetector CT imaging of the abdomen and pelvis was performed following the standard protocol without IV contrast. RADIATION DOSE REDUCTION: This exam was performed according to the departmental dose-optimization program which includes automated exposure control, adjustment of the mA and/or kV according to patient size and/or use of iterative reconstruction technique. COMPARISON:  CT chest abdomen pelvis, 04/28/2021 FINDINGS: Lower chest: Extensive heterogeneous ground-glass and consolidative airspace opacity throughout the lung bases (series 5, image 12). Coronary artery calcifications. Hepatobiliary: No focal liver abnormality is seen. Status post cholecystectomy. No biliary dilatation. 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 Status post distal pancreatectomy. Spleen: Status post splenectomy. Adrenals/Urinary Tract: Adrenal glands are unremarkable. Kidneys are normal, without renal calculi, solid lesion, or hydronephrosis. Bladder is unremarkable. Stomach/Bowel: Stomach is within normal  limits. Appendix appears normal. No evidence of bowel wall thickening, distention, or inflammatory changes. Left lower quadrant end colostomy with sigmoid colon resection and rectal stump. Vascular/Lymphatic: Aortic atherosclerosis. No enlarged abdominal or pelvic lymph nodes. Reproductive: No mass or other significant abnormality. Other: No abdominal wall hernia or abnormality. No ascites. Musculoskeletal: No acute or significant osseous findings. IMPRESSION: 1. Extensive heterogeneous ground-glass and consolidative airspace opacity throughout the lung bases, consistent with infection or aspiration. 2. 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 3. Status post splenectomy and distal pancreatectomy. 4. Status post sigmoid colon resection with left lower quadrant end colostomy. 5. Coronary artery disease. Aortic Atherosclerosis (ICD10-I70.0). Electronically Signed   By: Delanna Ahmadi M.D.   On: 07/26/2021 17:20   CT Head Wo Contrast  Result Date: 07/26/2021 CLINICAL DATA:  Altered mental status EXAM: CT HEAD WITHOUT CONTRAST TECHNIQUE: Contiguous axial images were obtained from the base of the skull through the vertex without intravenous contrast. RADIATION DOSE REDUCTION: This exam was performed according to the departmental dose-optimization program which includes automated exposure control, adjustment of the mA and/or kV according to patient size and/or use of iterative reconstruction technique. COMPARISON:  Head CT 06/25/2021 FINDINGS: Brain: Large remote infarction in the LEFT cerebellum and occipital lobe. No evidence of acute infarction. Extensive subcortical white matter hypodensities. No midline shift or mass effect. No intracranial hemorrhage. Basal cisterns are patent. There are periventricular and subcortical white matter hypodensities. Generalized cortical atrophy. Vascular: No hyperdense  vessel or unexpected calcification. Skull: Normal. Negative for fracture or focal lesion. Sinuses/Orbits: Paranasal sinuses and mastoid air cells are clear. Orbits are clear. Other: None. IMPRESSION: 1. No acute intracranial findings. 2. Large LEFT PCA territory infarction unchanged from 06/25/2021 Electronically Signed   By: Suzy Bouchard M.D.   On: 07/26/2021 17:18   DG Chest Port 1 View  Result Date: 07/26/2021 CLINICAL DATA:  61 year old female presenting for evaluation of chest and abdomen pain. EXAM: PORTABLE CHEST 1 VIEW COMPARISON:  Jun 25, 2021. FINDINGS: EKG leads project over the chest. Trachea is midline. Cardiomediastinal contours and hilar structures are normal. Bibasilar airspace disease similar in the RIGHT lower lobe and increased on the LEFT with nodular appearance more so on the RIGHT than the LEFT. No pneumothorax. No gross evidence of pleural effusion. On limited assessment there is no acute skeletal process. IMPRESSION: Findings suspicious for basilar pneumonia now seen bilaterally. Correlate with any risk factors for or history of aspiration based on distribution. More nodular appearance in the RIGHT lower chest persists. Suggest follow-up to ensure clearing and exclude persistent lung nodule. Electronically Signed   By: Zetta Bills M.D.   On: 07/26/2021 14:34    Microbiology: Results for orders placed or performed during the hospital encounter of 08/13/21  Resp Panel by RT-PCR (Flu A&B, Covid) Anterior Nasal Swab     Status: None   Collection Time: 08/13/21 11:45 AM   Specimen: Anterior Nasal Swab  Result Value Ref Range Status   SARS Coronavirus 2 by RT PCR NEGATIVE NEGATIVE Final    Comment: (NOTE) SARS-CoV-2 target nucleic acids are NOT DETECTED.  The SARS-CoV-2 RNA is generally detectable in upper respiratory specimens during the acute phase of infection. The lowest concentration of SARS-CoV-2 viral copies this assay can detect is 138 copies/mL. A negative result  does not preclude SARS-Cov-2 infection and should not be used as the sole basis for treatment or other patient management decisions. A negative result may occur with  improper specimen collection/handling, submission of specimen other than nasopharyngeal swab, presence of viral mutation(s) within the areas targeted by this assay, and inadequate number of viral copies(<138 copies/mL). A negative result must be combined with clinical observations, patient history, and epidemiological information. The expected result is Negative.  Fact Sheet for Patients:  EntrepreneurPulse.com.au  Fact Sheet for Healthcare Providers:  IncredibleEmployment.be  This test is no t yet approved or cleared by the [place] FDA and  has been authorized for detection and/or diagnosis of SARS-CoV-2 by FDA under an Emergency Use Authorization (EUA). This EUA will remain  in effect (meaning this test can be used) for the duration of the COVID-19 declaration under Section 564(b)(1) of the Act, 21 U.S.C.section 360bbb-3(b)(1), unless the authorization is terminated  or revoked sooner.       Influenza A by PCR NEGATIVE NEGATIVE Final   Influenza B by PCR NEGATIVE NEGATIVE Final    Comment: (NOTE) The Xpert Xpress SARS-CoV-2/FLU/RSV plus assay is intended as an aid in the diagnosis of influenza from Nasopharyngeal swab specimens and should not be used as a sole basis for treatment. Nasal washings and aspirates are unacceptable for Xpert Xpress SARS-CoV-2/FLU/RSV testing.  Fact Sheet  for Patients: EntrepreneurPulse.com.au  Fact Sheet for Healthcare Providers: IncredibleEmployment.be  This test is not yet approved or cleared by the [place] FDA and has been authorized for detection and/or diagnosis of SARS-CoV-2 by FDA under an Emergency Use Authorization (EUA). This EUA will remain in effect (meaning this test can be used) for  the duration of the COVID-19 declaration under Section 564(b)(1) of the Act, 21 U.S.C. section 360bbb-3(b)(1), unless the authorization is terminated or revoked.  Performed at [facility], [address] [place], [place] [address]   Blood Culture (routine x 2)     Status: None   Collection Time: 08/13/21 11:45 AM   Specimen: BLOOD  Result Value Ref Range Status   Specimen Description BLOOD  Final   Special Requests   Final    BOTTLES DRAWN AEROBIC ONLY Blood Culture adequate volume   Culture   Final    NO GROWTH 5 DAYS Performed at [facility], [address], [place], [place] [address]    Report Status 08/18/2021 FINAL  Final  Urine Culture     Status: None   Collection Time: 08/13/21 11:45 AM   Specimen: In/Out Cath Urine  Result Value Ref Range Status   Specimen Description   Final    IN/OUT CATH URINE Performed at [facility], [address] [place], [place] [address]    Special Requests   Final    NONE Performed at [facility], [address], [place], [place] [address]    Culture   Final    NO GROWTH Performed at [facility], [place] [address], [place], [place] [address]    Report Status 08/14/2021 FINAL  Final  Gastrointestinal Panel by PCR , Stool     Status: None   Collection Time: 08/13/21  1:50 PM   Specimen: Stool  Result Value Ref Range Status   Campylobacter species NOT DETECTED NOT DETECTED Final   Plesimonas shigelloides NOT DETECTED NOT DETECTED Final   Salmonella species NOT DETECTED NOT DETECTED Final   Yersinia enterocolitica NOT DETECTED NOT DETECTED Final   Vibrio species NOT DETECTED NOT DETECTED Final   Vibrio cholerae NOT DETECTED NOT DETECTED Final   Enteroaggregative E coli (EAEC) NOT DETECTED NOT DETECTED Final   Enteropathogenic E coli (EPEC) NOT DETECTED NOT DETECTED Final   Enterotoxigenic E coli (ETEC) NOT DETECTED NOT DETECTED Final   Shiga like toxin producing E coli (STEC) NOT  DETECTED NOT DETECTED Final   Shigella/Enteroinvasive E coli (EIEC) NOT DETECTED NOT DETECTED Final   Cryptosporidium NOT DETECTED NOT DETECTED Final   Cyclospora cayetanensis NOT DETECTED NOT DETECTED Final   Entamoeba histolytica NOT DETECTED NOT DETECTED Final   Giardia lamblia NOT DETECTED NOT DETECTED Final   Adenovirus F40/41 NOT DETECTED NOT DETECTED Final   Astrovirus NOT DETECTED NOT DETECTED Final   Norovirus GI/GII NOT DETECTED NOT DETECTED Final   Rotavirus A NOT DETECTED NOT DETECTED Final   Sapovirus (I, II, IV, and V) NOT DETECTED NOT DETECTED Final    Comment: Performed at [facility], [address] [place], [place] [address]  C Difficile Quick Screen w PCR reflex     Status: None   Collection Time: 08/13/21  1:50 PM   Specimen: Stool  Result Value Ref Range Status   C Diff antigen NEGATIVE NEGATIVE Final   C Diff toxin NEGATIVE NEGATIVE Final   C Diff interpretation No C. difficile detected.  Final    Comment: Performed at  [facility], [address], [place], [place] [address]  MRSA Next Gen by PCR, Nasal     Status: None   Collection Time: 08/13/21  5:30 PM   Specimen: Nasal Mucosa; Nasal Swab  Result Value Ref Range Status   MRSA by PCR Next Gen NOT DETECTED NOT DETECTED Final    Comment: (NOTE) The GeneXpert MRSA Assay (FDA approved for NASAL specimens only), is one component of a comprehensive MRSA colonization surveillance program. It is not intended to diagnose MRSA infection nor to guide or monitor treatment for MRSA infections. Test performance is not FDA approved in patients less than 33 years old. Performed at [facility], [address] [employer] [place], [place] [address]   Culture, blood (Routine X 2) w Reflex to ID Panel     Status: None   Collection Time: 08/14/21  1:52 AM   Specimen: BLOOD  Result Value Ref Range Status   Specimen Description BLOOD LEFT HAND  Final   Special Requests   Final    BOTTLES DRAWN AEROBIC  AND ANAEROBIC Blood Culture adequate volume   Culture   Final    NO GROWTH 5 DAYS Performed at [facility], [address] [place], [place] [address]    Report Status 08/19/2021 FINAL  Final    Labs: CBC: Recent Labs  Lab 08/21/21 0526  WBC 8.1  HGB 10.4*  HCT 33.5*  MCV 89.1  PLT 786   Basic Metabolic Panel: Recent Labs  Lab 08/18/21 0518 08/19/21 0905 08/21/21 0526 08/21/21 0909 08/22/21 0533 08/22/21 1438 08/22/21 1740 08/23/21 0255 08/24/21 0723 08/24/21 1148  NA 132* 132* 131*  --  130*  --   --  136  --   --   K 5.0 4.7 6.1*   < > 5.9*  --  5.1 5.8* 5.7* 4.2  CL 99 96* 103  --  97*  --   --  105  --   --   CO2 28 24 21*  --  24  --   --  26  --   --   GLUCOSE 423* 241* 270*  --  529* 44*  --  256*  --   --   BUN '13 10 18  '$ --  18  --   --  19  --   --   CREATININE 0.79 0.77 0.63  --  0.81  --   --  0.84  --   --   CALCIUM 9.0 9.4 8.8*  --  9.2  --   --  9.1  --   --    < > = values in this interval not displayed.   Liver Function Tests: No results for input(s): "AST", "ALT", "ALKPHOS", "BILITOT", "PROT", "ALBUMIN" in the last 168 hours. CBG: Recent Labs  Lab 08/23/21 1226 08/23/21 1751 08/23/21 2100 08/24/21 0802 08/24/21 1119  GLUCAP 278* 129* 264* 251* 325*    Discharge time spent: greater than 30 minutes.  Signed: Fritzi Mandes, MD [facility] 08/24/2021

## 2021-08-24 NOTE — Progress Notes (Signed)
Pts K 5.7, MD Posey Pronto, made aware and placing orders

## 2021-08-24 NOTE — TOC Progression Note (Addendum)
Transition of Care [facility]) - Progression Note    Patient Details  Name: Brittany [name] MRN: [id number] Date of Birth: Apr 26, 1960  Transition of Care [facility]) CM/SW [place], LCSW Phone Number: 08/24/2021, 9:28 AM  Clinical Narrative:  Josem Kaufmann still pending.   11:58 am: Auth still pending.  2:22 pm: Previous Josem Kaufmann is still valid through tonight at 11:59 pm. [facility] can accept her today. Patient does not qualify for EMS transport. Daughter is checking to see if anyone can take her by car.  3:33 pm: Left daughter a voicemail to see if she found a ride for patient.  Expected Discharge Plan: [place] Barriers to Discharge: Continued Medical Work up  Expected Discharge Plan and Services Expected Discharge Plan: [place] arrangements for the past 2 months: Single Family Home                                       Social Determinants of Health (SDOH) Interventions    Readmission Risk Interventions    03/06/2021    9:13 AM 02/16/2021    2:45 PM  Readmission Risk Prevention Plan  Transportation Screening Complete Complete  Medication Review ([place]) Complete Complete  PCP or Specialist appointment within 3-5 days of discharge Complete   SW Recovery Care/Counseling Consult  Complete  Palliative Care Screening Not Applicable Not [place] Not Applicable Not Applicable

## 2021-08-25 DIAGNOSIS — E1010 Type 1 diabetes mellitus with ketoacidosis without coma: Secondary | ICD-10-CM | POA: Diagnosis not present

## 2021-08-25 LAB — GLUCOSE, CAPILLARY
Glucose-Capillary: 149 mg/dL — ABNORMAL HIGH (ref 70–99)
Glucose-Capillary: 167 mg/dL — ABNORMAL HIGH (ref 70–99)
Glucose-Capillary: 173 mg/dL — ABNORMAL HIGH (ref 70–99)
Glucose-Capillary: 188 mg/dL — ABNORMAL HIGH (ref 70–99)
Glucose-Capillary: 22 mg/dL — CL (ref 70–99)
Glucose-Capillary: 254 mg/dL — ABNORMAL HIGH (ref 70–99)
Glucose-Capillary: 324 mg/dL — ABNORMAL HIGH (ref 70–99)
Glucose-Capillary: 342 mg/dL — ABNORMAL HIGH (ref 70–99)
Glucose-Capillary: 358 mg/dL — ABNORMAL HIGH (ref 70–99)
Glucose-Capillary: 573 mg/dL (ref 70–99)

## 2021-08-25 MED ORDER — INSULIN GLARGINE-YFGN 100 UNIT/ML ~~LOC~~ SOLN: 10.0000 [IU] | Freq: Every day | SUBCUTANEOUS | 11 refills | Status: DC

## 2021-08-25 MED ORDER — DEXTROSE 50 % IV SOLN
50.0000 mL | Freq: Once | INTRAVENOUS | Status: AC
  Administered 2021-08-25: 50 mL via INTRAVENOUS

## 2021-08-25 MED ORDER — LANTUS SOLOSTAR 100 UNIT/ML ~~LOC~~ SOPN: 10.0000 [IU] | PEN_INJECTOR | Freq: Every day | SUBCUTANEOUS | 11 refills | Status: DC

## 2021-08-25 MED ORDER — INSULIN ASPART 100 UNIT/ML IJ SOLN
3.0000 [IU] | Freq: Three times a day (TID) | INTRAMUSCULAR | Status: DC
  Administered 2021-08-25: 3 [IU] via SUBCUTANEOUS
  Filled 2021-08-25: qty 1

## 2021-08-25 MED ORDER — INSULIN ASPART 100 UNIT/ML IJ SOLN: 0.0000 [IU] | Freq: Three times a day (TID) | INTRAMUSCULAR | Status: DC

## 2021-08-25 MED ORDER — INSULIN ASPART 100 UNIT/ML IJ SOLN: 3.0000 [IU] | Freq: Three times a day (TID) | INTRAMUSCULAR | 11 refills | Status: DC

## 2021-08-25 MED ORDER — DEXTROSE 50 % IV SOLN
INTRAVENOUS | Status: AC
  Administered 2021-08-25: 50 mL
  Filled 2021-08-25: qty 50

## 2021-08-25 MED ORDER — INSULIN ASPART 100 UNIT/ML IJ SOLN: 0.0000 [IU] | Freq: Every day | INTRAMUSCULAR | Status: DC

## 2021-08-25 NOTE — Progress Notes (Signed)
Received verbal order to hold 5 units novolog insulin this am

## 2021-08-25 NOTE — Progress Notes (Signed)
Notified Crystal at [facility] that pt's would not be discharged there today

## 2021-08-25 NOTE — Plan of Care (Signed)
  Problem: Clinical Measurements: Goal: Will remain free from infection Outcome: Progressing Goal: Cardiovascular complication will be avoided Outcome: Progressing   Problem: Elimination: Goal: Will not experience complications related to urinary retention Outcome: Progressing   Problem: Pain Managment: Goal: General experience of comfort will improve Outcome: Progressing   Problem: Safety: Goal: Ability to remain free from injury will improve Outcome: Progressing   Problem: Skin Integrity: Goal: Risk for impaired skin integrity will decrease Outcome: Progressing

## 2021-08-25 NOTE — TOC Progression Note (Signed)
Transition of Care [facility]) - Progression Note    Patient Details  Name: Brittany [name] MRN: [id number] Date of Birth: 02-18-1960  Transition of Care [facility]) CM/SW [place], LCSW Phone Number: 08/25/2021, 3:18 PM  Clinical Narrative:     Attempted calls to patient's daughter x 2 to see if she has found a ride for patient to [facility]. No answer. VM full.  Expected Discharge Plan: [place] Barriers to Discharge: Barriers Resolved  Expected Discharge Plan and Services Expected Discharge Plan: [place] arrangements for the past 2 months: Single Family Home Expected Discharge Date: 08/25/21                                     Social Determinants of Health (SDOH) Interventions    Readmission Risk Interventions    03/06/2021    9:13 AM 02/16/2021    2:45 PM  Readmission Risk Prevention Plan  Transportation Screening Complete Complete  Medication Review ([place]) Complete Complete  PCP or Specialist appointment within 3-5 days of discharge Complete   SW Recovery Care/Counseling Consult  Complete  Palliative Care Screening Not Applicable Not [place] Not Applicable Not Applicable

## 2021-08-25 NOTE — TOC Progression Note (Addendum)
Transition of Care [facility]) - Progression Note    Patient Details  Name: Brittany [name] MRN: [id number] Date of Birth: 1960/06/12  Transition of Care [facility]) CM/SW [place], LCSW Phone Number: 08/25/2021, 8:46 AM  Clinical Narrative: Josem Kaufmann expired last night at 11:59 pm. Uploaded clinicals in [facility] portal to restart. Told MD patient may be denied due to improvement in function. Last PT note she walked 200 feet min guard with no assistive device. MD will call daughter to discuss.    10:07 am: Insurance extended [place] to 7/17. [facility] can still accept her today. PT will go see her now to make sure she's still appropriate to go in the car if daughter can find her a ride.  10:20 am: Patient can still go by car if daughter able to find her a ride. Daughter will start calling and follow up with CSW. RN can have her ready by 11:00. [facility] is aware and said they will be ready when she arrives.  12:17 pm: Left daughter a voicemail to see if there are any updates on finding patient a ride.  Expected Discharge Plan: [place] Barriers to Discharge: Barriers Resolved  Expected Discharge Plan and Services Expected Discharge Plan: [place] arrangements for the past 2 months: Single Family Home Expected Discharge Date: 08/24/21                                     Social Determinants of Health (SDOH) Interventions    Readmission Risk Interventions    03/06/2021    9:13 AM 02/16/2021    2:45 PM  Readmission Risk Prevention Plan  Transportation Screening Complete Complete  Medication Review ([place]) Complete Complete  PCP or Specialist appointment within 3-5 days of discharge Complete   SW Recovery Care/Counseling Consult  Complete  Palliative Care Screening Not Applicable Not [place] Not Applicable Not Applicable

## 2021-08-25 NOTE — Progress Notes (Signed)
   08/25/21 1640  Clinical Encounter Type  Visited With Health care provider  Visit Type Other (Comment) (rapid response)   Chaplain Burech Mcfarland attended pt's room following a rapid response called at 1636.  Pt receiving care at this time. Spoke with RN to offer assistance. No needs expressed by RN at this time. Unable to speak with pt.

## 2021-08-25 NOTE — Progress Notes (Signed)
Report called to [facility]. Spoke with [employer]

## 2021-08-25 NOTE — TOC Transition Note (Signed)
Transition of Care [facility]) - CM/SW Discharge Note   Patient Details  Name: Brittany [name] MRN: [id number] Date of Birth: 07/08/1960  [facility] CM/SW Contact:  Candie Chroman, LCSW Phone Number: 08/25/2021, 4:04 PM   Clinical Narrative:   Patient has orders to discharge to [facility] today. RN will call report to [phone] (Room 602B). Have been unable to reach daughter since this morning. Set up EMS transport. Mother is aware. No further concerns. CSW signing off.  Final next level of care: Skilled Nursing Facility Barriers to Discharge: Barriers Resolved   Patient Goals and CMS Choice Patient states their goals for this hospitalization and ongoing recovery are:: to go home CMS Medicare.gov Compare Post Acute Care list provided to:: Patient Choice offered to / list presented to : Adult Children  Discharge Placement   Existing PASRR number confirmed : 08/17/21          Patient chooses bed at: [facility] [place] Patient to be transferred to facility by: EMS Name of family member notified: Vicente Males and Jefferey Pica Patient and family notified of of transfer: 08/25/21  Discharge Plan and Services                                     Social Determinants of Health (SDOH) Interventions     Readmission Risk Interventions    03/06/2021    9:13 AM 02/16/2021    2:45 PM  Readmission Risk Prevention Plan  Transportation Screening Complete Complete  Medication Review ([place]) Complete Complete  PCP or Specialist appointment within 3-5 days of discharge Complete   SW Recovery Care/Counseling Consult  Complete  Palliative Care Screening Not Applicable Not [place] Not Applicable Not Applicable

## 2021-08-25 NOTE — Plan of Care (Signed)
  Problem: Education: Goal: Ability to describe self-care measures that may prevent or decrease complications (Diabetes Survival Skills Education) will improve Outcome: Adequate for Discharge Goal: Individualized Educational Video(s) Outcome: Adequate for Discharge   Problem: Coping: Goal: Ability to adjust to condition or change in health will improve Outcome: Adequate for Discharge   Problem: Fluid Volume: Goal: Ability to maintain a balanced intake and output will improve Outcome: Adequate for Discharge   Problem: Health Behavior/Discharge Planning: Goal: Ability to identify and utilize available resources and services will improve Outcome: Adequate for Discharge Goal: Ability to manage health-related needs will improve Outcome: Adequate for Discharge   Problem: Metabolic: Goal: Ability to maintain appropriate glucose levels will improve Outcome: Adequate for Discharge   Problem: Nutritional: Goal: Maintenance of adequate nutrition will improve Outcome: Adequate for Discharge Goal: Progress toward achieving an optimal weight will improve Outcome: Adequate for Discharge   Problem: Skin Integrity: Goal: Risk for impaired skin integrity will decrease Outcome: Adequate for Discharge   Problem: Tissue Perfusion: Goal: Adequacy of tissue perfusion will improve Outcome: Adequate for Discharge   Problem: Education: Goal: Knowledge of General Education information will improve Description: Including pain rating scale, medication(s)/side effects and non-pharmacologic comfort measures Outcome: Adequate for Discharge

## 2021-08-25 NOTE — Progress Notes (Signed)
D/C cancelled since pt's BG dropped to 22. Received 1 am D 50 and BG 214. Insulin changes made Will ensure pt eats snacks inbetween meals

## 2021-08-25 NOTE — Discharge Summary (Signed)
Physician Discharge Summary   [name] MRN: [id number] DOB: 1960/11/13  Admit date:     08/13/2021  Discharge date: 08/25/21  Discharge Physician: Fritzi Mandes   PCP: Pcp, No   Recommendations at discharge:    Dietitian to follow it rehab. Please do not give extra food to patient due to her diabetes. Give snack bid and at bed time. Pt to establish PCP in the area  Discharge Diagnoses: Active Problems:   Essential hypertension   History of stroke   Restless leg syndrome   IPMN (intraductal papillary mucinous neoplasm)   HLD (hyperlipidemia)   Depression with anxiety   Protein-calorie malnutrition, severe   Hyponatremia   Hospital Course: Ms. [name], is a 61 year old female with history of hypertension, poorly controlled insulin-dependent diabetes mellitus, history of left PCA territory infarct, right hemiparesis, COPD, history of pancreectomy/splenectomy, history of diverticulitis with abscess leading to colostomy in January 2023, anxiety, depression, who presented to [facility] ER via EMS on 07/2 with altered mental status.  Pts daughter reported to EMS the pt was "fine" at 08:30 am today, however she later found her on the floor prompting EMS notification.    DKA (diabetic ketoacidosis) ([place]) Labile DM-1 Patient admitted with DKA which has been resolved.  -- Insulin infusion has been switched with basal and short-acting. -Continue with Semglee,SSI and Aspart --diabetes coordinator following --d/w RN not to give extra trays of food--give snack in between meals -- Sugars remain labile!!. She is at a very high risk for re-admission due to her labile diabetes. --pt's d/c was held due to drop in sugar yday afternoon. She is curretnly best at baseline.   Severe sepsis [facility]) --Patient presented with severe sepsis and septic shock secondary to pneumonia.  Initially required pressors due to insufficient response to IV fluid. --Received broad-spectrum antibiotics and IV  fluid per sepsis protocol. --Antibiotics were switched with Unasyn for concern of aspiration pneumonia-- completed treatment --sepsis resolved   Hyperkalemia -- given lokelma, insulin, dextrose,kayexalate -- potassium down to 4.2   hypotension relative -- patient is asymptomatic. Systolic blood pressure is more than hundred. Will hold off midodrine   History of stroke - Continue home aspirin and Lipitor   Restless leg syndrome -Restart home Requip   IPMN (intraductal papillary mucinous neoplasm) -S/p pancreatectomy.  Being managed by oncology as an outpatient. -Continue with Creon   HLD (hyperlipidemia) - Continue Lipitor   Depression with anxiety - cont Zyprexa and mirtazapine   Protein-calorie malnutrition, severe Estimated body mass index is 17.22 kg/m as calculated from the following:   Height as of 07/26/21: '5\' 3"'$  (1.6 m).   Weight as of this encounter: 44.1 kg.   -Dietitian consult appreciated     Family communication tried call dter today--unble to leave VM Consults :PCCM CODE STATUS: full DVT Prophylaxis :enoxaparin      Disposition: Skilled nursing facility Diet recommendation:  Discharge Diet Orders (From admission, onward)     Start     Ordered   08/24/21 0000  Diet Carb Modified        08/24/21 1357           Carb modified diet DISCHARGE MEDICATION: Allergies as of 08/25/2021       Reactions   Cephalosporins Itching   TOLERATED ZOSYN (PIPERACILLIN) BEFORE   Hydromorphone Itching   Keflin [cephalothin] Itching   Lactose Intolerance (gi) Diarrhea        Medication List     STOP taking these medications  diclofenac Sodium 1 % Gel Commonly known as: VOLTAREN   insulin lispro 100 UNIT/ML KwikPen Commonly known as: HumaLOG KwikPen   Insulin Pen Needle 32G X 4 MM Misc   melatonin 3 MG Tabs tablet   Pancrelipase (Lip-Prot-Amyl) 24000-76000 units Cpep   polyethylene glycol 17 g packet Commonly known as: MIRALAX / GLYCOLAX        TAKE these medications    albuterol 108 (90 Base) MCG/ACT inhaler Commonly known as: VENTOLIN HFA Inhale 2 puffs into the lungs every 6 (six) hours as needed for wheezing or shortness of breath.   aspirin EC 81 MG tablet Take 1 tablet (81 mg total) by mouth daily. Swallow whole.   atorvastatin 40 MG tablet Commonly known as: LIPITOR Take 1 tablet (40 mg total) by mouth Nightly. What changed: when to take this   camphor-menthol lotion Commonly known as: SARNA Apply topically as needed for itching. What changed: how much to take   famotidine 20 MG tablet Commonly known as: PEPCID Take 1 tablet (20 mg total) by mouth daily.   feeding supplement (GLUCERNA SHAKE) Liqd Take 237 mLs by mouth 3 (three) times daily between meals.   insulin aspart 100 UNIT/ML injection Commonly known as: novoLOG Inject 0-5 Units into the skin at bedtime.   insulin aspart 100 UNIT/ML injection Commonly known as: novoLOG Inject 0-15 Units into the skin 3 (three) times daily with meals.   insulin aspart 100 UNIT/ML injection Commonly known as: novoLOG Inject 3 Units into the skin 3 (three) times daily with meals.   Lantus SoloStar 100 UNIT/ML Solostar Pen Generic drug: insulin glargine Inject 10 Units into the skin daily. What changed:  how to take this when to take this   mirtazapine 15 MG tablet Commonly known as: REMERON Take 15 mg by mouth at bedtime.   multivitamin with minerals Tabs tablet Take 1 tablet by mouth daily.   OLANZapine 7.5 MG tablet Commonly known as: ZYPREXA Take 1 tablet (7.5 mg total) by mouth at bedtime.   rOPINIRole 0.5 MG tablet Commonly known as: REQUIP Take 0.5 mg by mouth at bedtime.        Contact information for after-discharge care     Destination     [facility] Preferred SNF .   Service: Skilled Nursing Contact information: [address] [place] [phone]                     Discharge Exam: Danley Danker Weights   08/18/21 0500 08/21/21 0500 08/22/21 0500  Weight: 45 kg 41.5 kg 42.1 kg     Condition at discharge: fair  The results of significant diagnostics from this hospitalization (including imaging, microbiology, ancillary and laboratory) are listed below for reference.   Imaging Studies: CT HEAD WO CONTRAST (5MM)  Result Date: 08/13/2021 CLINICAL DATA:  61 y.o. female patient with history of diabetes and recent pneumonia. She comes in alert and disoriented and moaning. She is cold to the touch. She seems septic. No obvious source. Blood pressure initially 74/64 with a heart rate of 105 temperature of 82. Reportedly patient was found on the floor called by her daughter EXAM: CT HEAD WITHOUT CONTRAST TECHNIQUE: Contiguous axial images were obtained from the base of the skull through the vertex without intravenous contrast. RADIATION DOSE REDUCTION: This exam was performed according to the departmental dose-optimization program which includes automated exposure control, adjustment of the mA and/or kV according to patient size and/or use of iterative reconstruction  technique. COMPARISON:  07/26/2021 and 06/26/2018 FINDINGS: Brain: No evidence of acute infarction, hemorrhage, hydrocephalus, extra-axial collection or mass lesion/mass effect. Old infarcts, left temporal occipital lobe, left cerebellum and small infarct in right frontal lobe, stable. Patchy white matter hypoattenuation noted more diffusely consistent with moderate chronic microvascular ischemic change. Vascular: No hyperdense vessel or unexpected calcification. Skull: Normal. Negative for fracture or focal lesion. Sinuses/Orbits: Globes and orbits are unremarkable. Visualized sinuses are clear. Other: None. IMPRESSION: 1. No acute intracranial abnormalities. 2. Old infarcts and chronic microvascular ischemic change. Stable appearance from the prior study. Electronically Signed   By: Lajean Manes M.D.   On:  08/13/2021 16:19   DG Chest Port 1 View  Result Date: 08/13/2021 CLINICAL DATA:  Pneumonia.  Sepsis. EXAM: PORTABLE CHEST 1 VIEW COMPARISON:  07/26/2021 FINDINGS: The heart size and mediastinal contours are within normal limits. Pulmonary hyperinflation is again noted. Increased infiltrate is seen in the right lower lobe. No evidence of pleural effusion. IMPRESSION: Increased right lower lobe infiltrate, suspicious for pneumonia. Electronically Signed   By: Marlaine Hind M.D.   On: 08/13/2021 14:27   CT ABDOMEN PELVIS WO CONTRAST  Result Date: 07/26/2021 CLINICAL DATA:  Abdominal pain EXAM: CT ABDOMEN AND PELVIS WITHOUT CONTRAST TECHNIQUE: Multidetector CT imaging of the abdomen and pelvis was performed following the standard protocol without IV contrast. RADIATION DOSE REDUCTION: This exam was performed according to the departmental dose-optimization program which includes automated exposure control, adjustment of the mA and/or kV according to patient size and/or use of iterative reconstruction technique. COMPARISON:  CT chest abdomen pelvis, 04/28/2021 FINDINGS: Lower chest: Extensive heterogeneous ground-glass and consolidative airspace opacity throughout the lung bases (series 5, image 12). Coronary artery calcifications. Hepatobiliary: No focal liver abnormality is seen. Status post cholecystectomy. No biliary dilatation. 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 Status post distal pancreatectomy. Spleen: Status post splenectomy. Adrenals/Urinary Tract: Adrenal glands are unremarkable. Kidneys are normal, without renal calculi, solid lesion, or hydronephrosis. Bladder is unremarkable. Stomach/Bowel: Stomach is within normal limits. Appendix appears normal. No evidence of bowel wall thickening, distention, or inflammatory changes. Left lower quadrant  end colostomy with sigmoid colon resection and rectal stump. Vascular/Lymphatic: Aortic atherosclerosis. No enlarged abdominal or pelvic lymph nodes. Reproductive: No mass or other significant abnormality. Other: No abdominal wall hernia or abnormality. No ascites. Musculoskeletal: No acute or significant osseous findings. IMPRESSION: 1. Extensive heterogeneous ground-glass and consolidative airspace opacity throughout the lung bases, consistent with infection or aspiration. 2. 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 3. Status post splenectomy and distal pancreatectomy. 4. Status post sigmoid colon resection with left lower quadrant end colostomy. 5. Coronary artery disease. Aortic Atherosclerosis (ICD10-I70.0). Electronically Signed   By: Delanna Ahmadi M.D.   On: 07/26/2021 17:20   CT Head Wo Contrast  Result Date: 07/26/2021 CLINICAL DATA:  Altered mental status EXAM: CT HEAD WITHOUT CONTRAST TECHNIQUE: Contiguous axial images were obtained from the base of the skull through the vertex without intravenous contrast. RADIATION DOSE REDUCTION: This exam was performed according to the departmental dose-optimization program which includes automated exposure control, adjustment of the mA and/or kV according to patient size and/or use of iterative reconstruction technique. COMPARISON:  Head CT 06/25/2021 FINDINGS: Brain: Large remote infarction in the LEFT cerebellum and occipital  lobe. No evidence of acute infarction. Extensive subcortical white matter hypodensities. No midline shift or mass effect. No intracranial hemorrhage. Basal cisterns are patent. There are periventricular and subcortical white matter hypodensities. Generalized cortical atrophy. Vascular: No hyperdense vessel or unexpected calcification. Skull: Normal. Negative for fracture or focal lesion. Sinuses/Orbits: Paranasal sinuses and  mastoid air cells are clear. Orbits are clear. Other: None. IMPRESSION: 1. No acute intracranial findings. 2. Large LEFT PCA territory infarction unchanged from 06/25/2021 Electronically Signed   By: Suzy Bouchard M.D.   On: 07/26/2021 17:18   DG Chest Port 1 View  Result Date: 07/26/2021 CLINICAL DATA:  61 year old female presenting for evaluation of chest and abdomen pain. EXAM: PORTABLE CHEST 1 VIEW COMPARISON:  Jun 25, 2021. FINDINGS: EKG leads project over the chest. Trachea is midline. Cardiomediastinal contours and hilar structures are normal. Bibasilar airspace disease similar in the RIGHT lower lobe and increased on the LEFT with nodular appearance more so on the RIGHT than the LEFT. No pneumothorax. No gross evidence of pleural effusion. On limited assessment there is no acute skeletal process. IMPRESSION: Findings suspicious for basilar pneumonia now seen bilaterally. Correlate with any risk factors for or history of aspiration based on distribution. More nodular appearance in the RIGHT lower chest persists. Suggest follow-up to ensure clearing and exclude persistent lung nodule. Electronically Signed   By: Zetta Bills M.D.   On: 07/26/2021 14:34    Microbiology: Results for orders placed or performed during the hospital encounter of 08/13/21  Resp Panel by RT-PCR (Flu A&B, Covid) Anterior Nasal Swab     Status: None   Collection Time: 08/13/21 11:45 AM   Specimen: Anterior Nasal Swab  Result Value Ref Range Status   SARS Coronavirus 2 by RT PCR NEGATIVE NEGATIVE Final    Comment: (NOTE) SARS-CoV-2 target nucleic acids are NOT DETECTED.  The SARS-CoV-2 RNA is generally detectable in upper respiratory specimens during the acute phase of infection. The lowest concentration of SARS-CoV-2 viral copies this assay can detect is 138 copies/mL. A negative result does not preclude SARS-Cov-2 infection and should not be used as the sole basis for treatment or other patient management  decisions. A negative result may occur with  improper specimen collection/handling, submission of specimen other than nasopharyngeal swab, presence of viral mutation(s) within the areas targeted by this assay, and inadequate number of viral copies(<138 copies/mL). A negative result must be combined with clinical observations, patient history, and epidemiological information. The expected result is Negative.  Fact Sheet for Patients:  EntrepreneurPulse.com.au  Fact Sheet for Healthcare Providers:  IncredibleEmployment.be  This test is no t yet approved or cleared by the [place] FDA and  has been authorized for detection and/or diagnosis of SARS-CoV-2 by FDA under an Emergency Use Authorization (EUA). This EUA will remain  in effect (meaning this test can be used) for the duration of the COVID-19 declaration under Section 564(b)(1) of the Act, 21 U.S.C.section 360bbb-3(b)(1), unless the authorization is terminated  or revoked sooner.       Influenza A by PCR NEGATIVE NEGATIVE Final   Influenza B by PCR NEGATIVE NEGATIVE Final    Comment: (NOTE) The Xpert Xpress SARS-CoV-2/FLU/RSV plus assay is intended as an aid in the diagnosis of influenza from Nasopharyngeal swab specimens and should not be used as a sole basis for treatment. Nasal washings and aspirates are unacceptable for Xpert Xpress SARS-CoV-2/FLU/RSV testing.  Fact Sheet for Patients: EntrepreneurPulse.com.au  Fact Sheet for Healthcare Providers: IncredibleEmployment.be  This test is  not yet approved or cleared by the [place] and has been authorized for detection and/or diagnosis of SARS-CoV-2 by FDA under an Emergency Use Authorization (EUA). This EUA will remain in effect (meaning this test can be used) for the duration of the COVID-19 declaration under Section 564(b)(1) of the Act, 21 U.S.C. section 360bbb-3(b)(1), unless the  authorization is terminated or revoked.  Performed at [facility], [address] [place], [place] [address]   Blood Culture (routine x 2)     Status: None   Collection Time: 08/13/21 11:45 AM   Specimen: BLOOD  Result Value Ref Range Status   Specimen Description BLOOD  Final   Special Requests   Final    BOTTLES DRAWN AEROBIC ONLY Blood Culture adequate volume   Culture   Final    NO GROWTH 5 DAYS Performed at [facility], [address], [place], [place] [address]    Report Status 08/18/2021 FINAL  Final  Urine Culture     Status: None   Collection Time: 08/13/21 11:45 AM   Specimen: In/Out Cath Urine  Result Value Ref Range Status   Specimen Description   Final    IN/OUT CATH URINE Performed at [facility], [address], [place], [place] [address]    Special Requests   Final    NONE Performed at [facility], [address] [place], [place] [address]    Culture   Final    NO GROWTH Performed at [facility], [place] [address], [place], [place] [address]    Report Status 08/14/2021 FINAL  Final  Gastrointestinal Panel by PCR , Stool     Status: None   Collection Time: 08/13/21  1:50 PM   Specimen: Stool  Result Value Ref Range Status   Campylobacter species NOT DETECTED NOT DETECTED Final   Plesimonas shigelloides NOT DETECTED NOT DETECTED Final   Salmonella species NOT DETECTED NOT DETECTED Final   Yersinia enterocolitica NOT DETECTED NOT DETECTED Final   Vibrio species NOT DETECTED NOT DETECTED Final   Vibrio cholerae NOT DETECTED NOT DETECTED Final   Enteroaggregative E coli (EAEC) NOT DETECTED NOT DETECTED Final   Enteropathogenic E coli (EPEC) NOT DETECTED NOT DETECTED Final   Enterotoxigenic E coli (ETEC) NOT DETECTED NOT DETECTED Final   Shiga like toxin producing E coli (STEC) NOT DETECTED NOT DETECTED Final   Shigella/Enteroinvasive E coli (EIEC) NOT DETECTED NOT DETECTED Final   Cryptosporidium NOT  DETECTED NOT DETECTED Final   Cyclospora cayetanensis NOT DETECTED NOT DETECTED Final   Entamoeba histolytica NOT DETECTED NOT DETECTED Final   Giardia lamblia NOT DETECTED NOT DETECTED Final   Adenovirus F40/41 NOT DETECTED NOT DETECTED Final   Astrovirus NOT DETECTED NOT DETECTED Final   Norovirus GI/GII NOT DETECTED NOT DETECTED Final   Rotavirus A NOT DETECTED NOT DETECTED Final   Sapovirus (I, II, IV, and V) NOT DETECTED NOT DETECTED Final    Comment: Performed at [facility], [address] [place], [place] [address]  C Difficile Quick Screen w PCR reflex     Status: None   Collection Time: 08/13/21  1:50 PM   Specimen: Stool  Result Value Ref Range Status   C Diff antigen NEGATIVE NEGATIVE Final   C Diff toxin NEGATIVE NEGATIVE Final   C Diff interpretation No C. difficile detected.  Final    Comment: Performed at [facility], [address], [place], [place] [address]  MRSA Next Gen  by PCR, Nasal     Status: None   Collection Time: 08/13/21  5:30 PM   Specimen: Nasal Mucosa; Nasal Swab  Result Value Ref Range Status   MRSA by PCR Next Gen NOT DETECTED NOT DETECTED Final    Comment: (NOTE) The GeneXpert MRSA Assay (FDA approved for NASAL specimens only), is one component of a comprehensive MRSA colonization surveillance program. It is not intended to diagnose MRSA infection nor to guide or monitor treatment for MRSA infections. Test performance is not FDA approved in patients less than 45 years old. Performed at [facility], [address] [place], [place] [address]   Culture, blood (Routine X 2) w Reflex to ID Panel     Status: None   Collection Time: 08/14/21  1:52 AM   Specimen: BLOOD  Result Value Ref Range Status   Specimen Description BLOOD LEFT HAND  Final   Special Requests   Final    BOTTLES DRAWN AEROBIC AND ANAEROBIC Blood Culture adequate volume   Culture   Final    NO GROWTH 5 DAYS Performed at [facility], [address] [place], [place] [address]    Report Status 08/19/2021 FINAL  Final    Labs: CBC: Recent Labs  Lab 08/21/21 0526  WBC 8.1  HGB 10.4*  HCT 33.5*  MCV 89.1  PLT 601   Basic Metabolic Panel: Recent Labs  Lab 08/19/21 0905 08/21/21 0526 08/21/21 0909 08/22/21 0533 08/22/21 1438 08/22/21 1740 08/23/21 0255 08/24/21 0723 08/24/21 1148  NA 132* 131*  --  130*  --   --  136  --   --   K 4.7 6.1*   < > 5.9*  --  5.1 5.8* 5.7* 4.2  CL 96* 103  --  97*  --   --  105  --   --   CO2 24 21*  --  24  --   --  26  --   --   GLUCOSE 241* 270*  --  529* 44*  --  256*  --   --   BUN 10 18  --  18  --   --  19  --   --   CREATININE 0.77 0.63  --  0.81  --   --  0.84  --   --   CALCIUM 9.4 8.8*  --  9.2  --   --  9.1  --   --    < > = values in this interval not displayed.   Liver Function Tests: No results for input(s): "AST", "ALT", "ALKPHOS", "BILITOT", "PROT", "ALBUMIN" in the last 168 hours. CBG: Recent Labs  Lab 08/24/21 1722 08/24/21 2054 08/25/21 0045 08/25/21 0820 08/25/21 0826  GLUCAP 131* 387* 173* 167* 149*    Discharge time spent: greater than 30 minutes.  Signed: Fritzi Mandes, MD [facility] 08/25/2021

## 2021-08-25 NOTE — Progress Notes (Signed)
Pt was found in bed diaphoretic and unarouseable. CBAG 22.  1 amp D50 given. CBAG was 254 after.  Dr Posey Pronto was present in room

## 2021-08-25 NOTE — Progress Notes (Signed)
Hypoglycemic Event  CBG: 22  Treatment: D50 50 mL (25 gm)  Symptoms: Pale and Sweaty  Follow-up CBG: Time:1650 CBG Result:188  Possible Reasons for Event: Unknown  Comments/MD notified: Dr Posey Pronto aware and at bedside, RRT called and at bedside. IV placed, 1 amp of D50 given, patient was lethargic but now alert but still drowsy. Orders placed by MD.    Brittany [name]

## 2021-08-25 NOTE — Significant Event (Signed)
Rapid Response Event Note   Reason for Call :  Low Blood sugar  Initial Focused Assessment:   Bedside RN stated that patient's blood sugar was 23.  Patient not responsive- though vitals stable.    Interventions:   Dr. Posey Pronto at bedside- she was explaining that this is an on-going issue. Amp of dextrose given and patient's sugar up to 200's.   Plan of Care:  Dr. Posey Pronto stated she was re-dosing her insulin medication- pt alert and talking now.   Event Summary:   MD Notified: Dr. Posey Pronto Call Time:16:36 Arrival Time: 16:36 End Time:16:50  Reggie Pile, RN

## 2021-08-26 DIAGNOSIS — E43 Unspecified severe protein-calorie malnutrition: Secondary | ICD-10-CM | POA: Diagnosis not present

## 2021-08-26 DIAGNOSIS — G2581 Restless legs syndrome: Secondary | ICD-10-CM | POA: Diagnosis not present

## 2021-08-26 DIAGNOSIS — E1110 Type 2 diabetes mellitus with ketoacidosis without coma: Secondary | ICD-10-CM | POA: Diagnosis not present

## 2021-08-26 DIAGNOSIS — E782 Mixed hyperlipidemia: Secondary | ICD-10-CM | POA: Diagnosis not present

## 2021-08-26 LAB — GLUCOSE, CAPILLARY
Glucose-Capillary: 412 mg/dL — ABNORMAL HIGH (ref 70–99)
Glucose-Capillary: 208 mg/dL — ABNORMAL HIGH (ref 70–99)
Glucose-Capillary: 50 mg/dL — ABNORMAL LOW (ref 70–99)
Glucose-Capillary: 83 mg/dL (ref 70–99)
Glucose-Capillary: 101 mg/dL — ABNORMAL HIGH (ref 70–99)
Glucose-Capillary: 435 mg/dL — ABNORMAL HIGH (ref 70–99)
Glucose-Capillary: 399 mg/dL — ABNORMAL HIGH (ref 70–99)
Glucose-Capillary: 160 mg/dL — ABNORMAL HIGH (ref 70–99)
Glucose-Capillary: 272 mg/dL — ABNORMAL HIGH (ref 70–99)

## 2021-08-26 MED ORDER — ACETAMINOPHEN 325 MG PO TABS
650.0000 mg | ORAL_TABLET | Freq: Four times a day (QID) | ORAL | Status: DC | PRN
  Administered 2021-08-26 – 2021-08-27 (×2): 650 mg via ORAL
  Filled 2021-08-26 (×2): qty 2

## 2021-08-26 MED ORDER — INSULIN ASPART 100 UNIT/ML IJ SOLN
0.0000 [IU] | Freq: Three times a day (TID) | INTRAMUSCULAR | Status: DC
  Administered 2021-08-26 (×2): 9 [IU] via SUBCUTANEOUS
  Administered 2021-08-27: 3 [IU] via SUBCUTANEOUS
  Administered 2021-08-27: 9 [IU] via SUBCUTANEOUS
  Administered 2021-08-27: 7 [IU] via SUBCUTANEOUS
  Filled 2021-08-26 (×5): qty 1

## 2021-08-26 MED ORDER — INSULIN ASPART 100 UNIT/ML IJ SOLN: 0.0000 [IU] | Freq: Three times a day (TID) | INTRAMUSCULAR | Status: DC

## 2021-08-26 MED ORDER — INSULIN ASPART 100 UNIT/ML IJ SOLN
20.0000 [IU] | Freq: Once | INTRAMUSCULAR | Status: AC
  Administered 2021-08-26: 20 [IU] via SUBCUTANEOUS
  Filled 2021-08-26: qty 1

## 2021-08-26 MED ORDER — INSULIN ASPART 100 UNIT/ML IJ SOLN
0.0000 [IU] | Freq: Every day | INTRAMUSCULAR | Status: DC
  Administered 2021-08-26: 3 [IU] via SUBCUTANEOUS
  Filled 2021-08-26: qty 1

## 2021-08-26 MED ORDER — INSULIN ASPART 100 UNIT/ML IJ SOLN
7.0000 [IU] | Freq: Once | INTRAMUSCULAR | Status: AC
Start: 2021-08-26 — End: 2021-08-26
  Administered 2021-08-26: 7 [IU] via SUBCUTANEOUS
  Filled 2021-08-26: qty 1

## 2021-08-26 NOTE — Plan of Care (Signed)
  Problem: Nutritional: Goal: Maintenance of adequate nutrition will improve Outcome: Progressing   Problem: Skin Integrity: Goal: Risk for impaired skin integrity will decrease Outcome: Progressing   Problem: Activity: Goal: Risk for activity intolerance will decrease Outcome: Progressing   Problem: Nutrition: Goal: Adequate nutrition will be maintained Outcome: Progressing

## 2021-08-26 NOTE — Progress Notes (Signed)
Mobility Specialist - Progress Note    08/26/21 1600  Mobility  Activity Ambulated with assistance in hallway;Stood at bedside  Level of Assistance Standby assist, set-up cues, supervision of patient - no hands on  Assistive Device None  Distance Ambulated (ft) 10 ft  Activity Response Tolerated well  $Mobility charge 1 Mobility    Pt ambulates in hallway SBA but declines further gait d/t waiting on lunch. Pt returns EOB and is left with needs in reach and bed alarm set.  Merrily Brittle Mobility Specialist 08/26/21, 4:25 PM

## 2021-08-26 NOTE — Progress Notes (Signed)
Brittany [name] at [place] NAME: Brittany [name]    MR#:  [id number]  DATE OF BIRTH:  1961/02/11  SUBJECTIVE:   patient seen earlier.  No new complaints. Sugars remain labile. She got a high dose of 20 units insulin last night. Sugars drop down to 50 this morning.  VITALS:  Blood pressure 104/73, pulse 93, temperature 98.6 F (37 C), resp. rate 16, weight 42.1 kg, SpO2 100 %.  PHYSICAL EXAMINATION:   GENERAL:  61 y.o.-year-old patient lying in the bed with no acute distress.  LUNGS: Normal breath sounds bilaterally, no wheezing CARDIOVASCULAR: S1, S2 normal. No murmurs  ABDOMEN: Soft, nontender, nondistended. Bowel sounds present. Colostomy+ EXTREMITIES: No  edema b/l.    NEUROLOGIC: nonfocal  patient is alert and awake SKIN: No obvious rash, lesion, or ulcer.   LABORATORY PANEL:  CBC Recent Labs  Lab 08/21/21 0526  WBC 8.1  HGB 10.4*  HCT 33.5*  PLT 370     Chemistries  Recent Labs  Lab 08/23/21 0255 08/24/21 0723 08/24/21 1148  NA 136  --   --   K 5.8*   < > 4.2  CL 105  --   --   CO2 26  --   --   GLUCOSE 256*  --   --   BUN 19  --   --   CREATININE 0.84  --   --   CALCIUM 9.1  --   --    < > = values in this interval not displayed.     Assessment and Plan  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  Pts daughter reported to EMS the pt was "fine" at 08:30 am today, however she later found her on the floor prompting EMS notification.   DKA (diabetic ketoacidosis) ([place]) Labile DM-1 history of pancreatic cancer Patient admitted with DKA which has been resolved.  -- Insulin  infusion has been switched with basal and short-acting. -Continue with Semglee,SSI and Aspart --diabetes coordinator following --d/w RN not to give extra trays of food--give snack in between meals -- patient had episode of hypoglycemia three days in a row.  -- Sugars are hard to control  and very LABILE  Severe sepsis (HCC)--resolved --Patient presented with severe sepsis and septic shock secondary to pneumonia.  completed treatment    Hyperkalemia -- given lokelma, insulin, dextrose, cassoulet -- potassium down to 4.2  hypotension relative -- patient is asymptomatic. Systolic blood pressure is more than hundred. 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  Being managed by oncology as an outpatient. -Continue with Creon   HLD (hyperlipidemia) - Continue Lipitor   Depression with anxiety - Restart Zyprexa and mirtazapine   Protein-calorie malnutrition, severe Estimated body mass index is 17.22 kg/m as calculated from the following:   Height as of 07/26/21: '5\' 3"'$  (1.6 m).   Weight as of this encounter: 44.1 kg.   -Dietitian consult appreciated    Procedures:none Family communication :none today Consults :PCCM CODE STATUS: full DVT Prophylaxis :enoxaparin Level of care: Med-Surg Status is: Inpatient Remains  inpatient appropriate because: will continue to monitor over the weekend and hopefully sugar remains stable discharged to rehab on Monday    TOTAL TIME TAKING CARE OF THIS PATIENT: 25 minutes.  >50% time spent on counselling and coordination of care  Note: This dictation was prepared with Dragon dictation along with smaller phrase technology. Any transcriptional errors that result from this process are unintentional.  Brittany [name] M.D    [facility]   CC: Primary care physician; Pcp, No

## 2021-08-26 NOTE — Significant Event (Signed)
Hyper/Hypo Glycemic patient event details from night shift RN   2354: Brittany [name], M.D. Was chat paged regarding this patient's bloog glucose being 573.  Patient is asymptomatice currently. Per MD order to add in an order for Novolog 20 unit subcutaneous, then recheck in one hour.  In one hour then cover per sliding scale.  Nurse confirmed the 20 units of Novolog.  14: Nurse administered the  20 units of Novalog  See chat message thread below:  0146: Nurse CBG was rechecked and resulted as 412.   0150: "I have rechecked the patients BS and is now 412.  Per your recommendations, the patient will now need to follow sliding scale orders and I was going to place the order for sliding scale however, I do not feel comfortable doing so as there are multiple sliding scale orders in the [facility].  Can you please add the appropriate sliding scale order.  Thank you"  0220: Nurse "Thank you for putting the updated orders in, to confirm, no insulin order for the BS of 412? To reassess with next CBG check?"  0249: Provider "Same but add in one hour and cover with the same resistant novolog that I put please"  0253: Nurse "To confirm, I will add in the same sliding scale order have in the [facility] currently, should this be a one time order?" 0253: Provider: "Correct"  0254:  Nurse 'I will now recheck her blood sugar, and admin the Insulin based on sliding scale protocol."  0256: Provider "Excellent"  [phone]:  Nurse "The order management is not allowing me add the order in for sliding scale the same as you have it, as I do not have the same ordering privileges as you do.  Can you please add this order in, I would greatly appreciate it."  0309: Provider "Just tell me the blood sugar and I will put a one-time order please Like whatever the sugar is I would put an order for the same exact amount that the sliding scale tells you to put as a one-time order"  0310: Nurse "The Blood sugar is 208  currently"  0312: Provider "What does a sliding scale tell you For discharge I mean for this sugar"  0313: Nurse "Per your order, for CBG 201-250, would be 7 units"  0314: Provider "Doristine Devoid so we put one-time order for 7 units and that set and then we will follow the sliding scale as I ordered from now on since the sugar improved Thank you so much for your help"  0319:  Nurse "The order has been placed.  I will administer once pharmacy approves the order, then recheck around 4, have day shift recheck at 0800, to follow your sliding scale order."  0322: Provider "YW"  [phone]: Nurse "FYI: 1 hour post CBG of 208, after 7 unit was given, CBG was 50, Juice and cracker was given, patient is asymptomatic, CBG was rechecked and resulted as 66, more juice and crackers was given, rechecked CBG is now 83.  Nurse will pass off to the day shift RN"  0612:Provider Thanks so much   Nurse will monitor the patient as per unit protocol and pass the above details off to the day shift RN

## 2021-08-26 NOTE — Inpatient Diabetes Management (Addendum)
Inpatient Diabetes Program Recommendations  AACE/ADA: New Consensus Statement on Inpatient Glycemic Control (2015)  Target Ranges:  Prepandial:   less than 140 mg/dL      Peak postprandial:   less than 180 mg/dL (1-2 hours)      Critically ill patients:  140 - 180 mg/dL   Lab Results  Component Value Date   GLUCAP 101 (H) 08/26/2021   HGBA1C >15.5 (H) 08/13/2021    Review of Glycemic Control  Latest Reference Range & Units 08/25/21 08:20 08/25/21 08:26 08/25/21 12:21 08/25/21 16:34 08/25/21 16:41 08/25/21 16:50 08/25/21 20:19 08/25/21 21:57 08/25/21 23:47 08/26/21 01:46  Glucose-Capillary 70 - 99 mg/dL 167 (H) 149 (H)  Novolog 2 units (meal coverage not given)  Semglee 10 units 324 (H)  Novolog 14 units (meal coverage + Correction) 22 (LL) 254 (H) 188 (H) 342 (H) 358 (H)  Novolog 5 units 573 (HH)  Novolog 20 units 412 (H)    Latest Reference Range & Units 08/26/21 02:58 08/26/21 04:43 08/26/21 05:58 08/26/21 07:40  Glucose-Capillary 70 - 99 mg/dL 208 (H)  Novolog 7 units  (Correction only) 50 (L) 83 101 (H)   Diabetes history: DM 1 Outpatient Diabetes medications: Lantus 10 units qhs, Humalog 5 units tid Current orders for Inpatient glycemic control:  Novolog 0-20 units tid and hs Semglee 10 units Daily  Glucerna tid between meals Renal function WNL  Inpatient Diabetes Program Recommendations:    Pt has type 1 diabetes and requires Novolog for correction and carbohydrate coverage to prevent prandial spikes.  Pt is more sensitive to large Novolog doses. Needs lower Novolog correction doses based on glucose trends. Needs meal coverage to prevent postprandial spikes.  -   Consider reducing Novolog correction to 0-9 units tid only -   Add Novolog hs scale -   Add Novolog 3 units tid meal coverage if eating >50% of meals AND if glucose is greater than 80 mg/dl.  Thanks,  Tama Headings RN, MSN, BC-ADM Inpatient Diabetes Coordinator Team Pager [phone] (8a-5p)

## 2021-08-26 NOTE — Progress Notes (Signed)
Physical Therapy Treatment Patient Details Name: Brittany [name] MRN: [id number] DOB: March 24, 1960 Today's Date: 08/26/2021   History of Present Illness Pt. is a 61 y.o. F presenting to [facility] on 7/2 with AMS. Admitted to SDU with acute metabolic encephalopathy, severe sepsis with septic shock secondary to pneumonia likely aspiration, hypothermia, and DKA. 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       Recommendations for follow up therapy are one component of a multi-disciplinary discharge planning process, led by the attending physician.  Recommendations may be updated based on patient status, additional functional criteria and insurance authorization.  Follow Up Recommendations  Skilled nursing-short term rehab (<3 hours/day) (for focused higher level balance and overall improving safety awareness)     Assistance Recommended at Discharge Frequent or constant Supervision/Assistance  Patient can return home with the following A little help with walking and/or transfers;A little help with bathing/dressing/bathroom;Direct supervision/assist for medications management;Direct supervision/assist for financial management;Assist for transportation;Help with stairs or ramp for entrance   Equipment Recommendations  Rolling walker (2 wheels)       Precautions / Restrictions Precautions Precautions: Fall Precaution Comments: colostomy Restrictions Weight Bearing Restrictions: No     Mobility  Bed Mobility    General bed mobility comments: In recliner pre/post session    Transfers Overall transfer level: Modified independent   Ambulation/Gait Ambulation/Gait assistance: Min guard, Min assist  200 ft Assistive device: None Gait Pattern/deviations: Step-through pattern, Drifts right/left, Staggering right,  Staggering left, Narrow base of support, Scissoring Gait velocity: decreased     General Gait Details: Pt continues to present with unsteadiness and drifting L/R. scissoring at times without use of RW. Will benefit from continued skilled PT at DC to address balance concerns and safety awareness. poorly managed blood sugars     Balance Overall balance assessment: Needs assistance Sitting-balance support: Feet unsupported, Feet supported, No upper extremity supported Sitting balance-Leahy Scale: Good     Standing balance support: During functional activity, No upper extremity supported Standing balance-Leahy Scale: Poor Standing balance comment: Poor balance without UE support during dynamic task       Cognition Arousal/Alertness: Awake/alert Behavior During Therapy: WFL for tasks assessed/performed Overall Cognitive Status: Within Functional Limits for tasks assessed Area of Impairment: Following commands, Safety/judgement, Awareness, Problem solving    Following Commands: Follows one step commands consistently, Follows multi-step commands inconsistently Safety/Judgement: Decreased awareness of safety, Decreased awareness of deficits   Problem Solving: Slow processing General Comments: Pt si A and O x 3 and agreable to OOB activity          PT Goals (current goals can now be found in the care plan section) Acute Rehab PT Goals Patient Stated Goal: get better and go home    Frequency    Min 2X/week      PT Plan Current plan remains appropriate       AM-PAC PT "6 Clicks" Mobility   Outcome Measure  Help needed turning from your back to your side while in a flat bed without using bedrails?: A Little Help needed moving from lying on your back to sitting on the side of a flat bed without using bedrails?: A Little Help needed moving to and from a bed to a chair (including a wheelchair)?: A Little Help needed standing up from a chair using your arms (e.g., wheelchair  or bedside chair)?: A Little  Help needed to walk in hospital room?: A Little Help needed climbing 3-5 steps with a railing? : A Little 6 Click Score: 18    End of Session   Activity Tolerance: Patient tolerated treatment well Patient left: in chair;with call bell/phone within reach;with chair alarm set Nurse Communication: Mobility status PT Visit Diagnosis: Muscle weakness (generalized) (M62.81);Difficulty in walking, not elsewhere classified (R26.2)     Time:  -     Charges:                        Julaine Fusi PTA 08/26/21, 5:05 PM

## 2021-08-27 DIAGNOSIS — E1110 Type 2 diabetes mellitus with ketoacidosis without coma: Secondary | ICD-10-CM | POA: Diagnosis not present

## 2021-08-27 DIAGNOSIS — E782 Mixed hyperlipidemia: Secondary | ICD-10-CM | POA: Diagnosis not present

## 2021-08-27 DIAGNOSIS — E43 Unspecified severe protein-calorie malnutrition: Secondary | ICD-10-CM | POA: Diagnosis not present

## 2021-08-27 DIAGNOSIS — G2581 Restless legs syndrome: Secondary | ICD-10-CM | POA: Diagnosis not present

## 2021-08-27 LAB — GLUCOSE, CAPILLARY
Glucose-Capillary: 131 mg/dL — ABNORMAL HIGH (ref 70–99)
Glucose-Capillary: 152 mg/dL — ABNORMAL HIGH (ref 70–99)
Glucose-Capillary: 202 mg/dL — ABNORMAL HIGH (ref 70–99)
Glucose-Capillary: 212 mg/dL — ABNORMAL HIGH (ref 70–99)
Glucose-Capillary: 226 mg/dL — ABNORMAL HIGH (ref 70–99)
Glucose-Capillary: 338 mg/dL — ABNORMAL HIGH (ref 70–99)
Glucose-Capillary: 402 mg/dL — ABNORMAL HIGH (ref 70–99)
Glucose-Capillary: 527 mg/dL (ref 70–99)
Glucose-Capillary: 529 mg/dL (ref 70–99)
Glucose-Capillary: >600 mg/dL (ref 70–99)

## 2021-08-27 MED ORDER — INSULIN ASPART 100 UNIT/ML IJ SOLN
15.0000 [IU] | Freq: Once | INTRAMUSCULAR | Status: AC
  Administered 2021-08-27: 15 [IU] via SUBCUTANEOUS
  Filled 2021-08-27: qty 1

## 2021-08-27 MED ORDER — INSULIN ASPART 100 UNIT/ML IJ SOLN
5.0000 [IU] | Freq: Once | INTRAMUSCULAR | Status: AC
Start: 2021-08-27 — End: 2021-08-27
  Administered 2021-08-27: 5 [IU] via SUBCUTANEOUS
  Filled 2021-08-27: qty 1

## 2021-08-27 MED ORDER — INSULIN GLARGINE-YFGN 100 UNIT/ML ~~LOC~~ SOLN
8.0000 [IU] | Freq: Every day | SUBCUTANEOUS | Status: DC
  Administered 2021-08-27 – 2021-08-28 (×2): 8 [IU] via SUBCUTANEOUS
  Filled 2021-08-27 (×2): qty 0.08

## 2021-08-27 MED ORDER — INSULIN ASPART 100 UNIT/ML IJ SOLN
0.0000 [IU] | Freq: Three times a day (TID) | INTRAMUSCULAR | Status: DC
  Administered 2021-08-27: 20 [IU] via SUBCUTANEOUS
  Filled 2021-08-27 (×2): qty 1

## 2021-08-27 NOTE — Progress Notes (Signed)
[place] at [place] NAME: Brittany [name]    MR#:  [id number]  DATE OF BIRTH:  03-02-1960  SUBJECTIVE:   patient seen earlier.  No new complaints.  Asking for more food!! Trying to avoid hypoglycemia Sugars labile  VITALS:  Blood pressure 121/87, pulse 87, temperature 97.6 F (36.4 C), resp. rate 16, weight 44.9 kg, SpO2 100 %.  PHYSICAL EXAMINATION:   GENERAL:  61 y.o.-year-old patient lying in the bed with no acute distress.  LUNGS: Normal breath sounds bilaterally, no wheezing CARDIOVASCULAR: S1, S2 normal. No murmurs  ABDOMEN: Soft, nontender, nondistended. Bowel sounds present. Colostomy+ EXTREMITIES: No  edema b/l.    NEUROLOGIC: nonfocal  patient is alert and awake SKIN: No obvious rash, lesion, or ulcer.   LABORATORY PANEL:  CBC Recent Labs  Lab 08/21/21 0526  WBC 8.1  HGB 10.4*  HCT 33.5*  PLT 370     Chemistries  Recent Labs  Lab 08/23/21 0255 08/24/21 0723 08/24/21 1148  NA 136  --   --   K 5.8*   < > 4.2  CL 105  --   --   CO2 26  --   --   GLUCOSE 256*  --   --   BUN 19  --   --   CREATININE 0.84  --   --   CALCIUM 9.1  --   --    < > = values in this interval not displayed.     Assessment and Plan  [name], is a 61 year old female with history of hypertension, poorly controlled insulin-dependent diabetes mellitus, history of left PCA territory infarct, right hemiparesis, COPD, history of pancreectomy/splenectomy, history of diverticulitis with abscess leading to colostomy in January 2023, anxiety, depression, who presented to [facility] ER via EMS on 07/2 with altered mental status.  Pts daughter reported to EMS the pt was "fine" at 08:30 am today, however she later found her on the floor prompting EMS notification.   DKA (diabetic ketoacidosis) ([place]) Labile DM-1 history of pancreatic cancer Patient admitted with DKA which has been resolved.  -- Insulin infusion has been switched with basal and  short-acting. -Continue with Semglee,SSI  --d/w RN not to give extra trays of food--give snack in between meals -- patient had episode of hypoglycemia three days in a row.  -- Sugars are hard to control  and very LABILE  Severe sepsis (HCC)--resolved --Patient presented with severe sepsis and septic shock secondary to pneumonia.  completed treatment  Hyperkalemia--resolved -- potassium down to 4.2  hypotension relative -- patient is asymptomatic.  Systolic blood pressure is more than hundred.   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  Being managed by oncology as an outpatient. -Continue with Creon   HLD (hyperlipidemia) - Continue Lipitor   Depression with anxiety - on  Zyprexa and mirtazapine    Nutrition Status: Nutrition Problem: Severe Malnutrition Etiology: chronic illness (COPD, pancreatic cancer) Signs/Symptoms: severe fat depletion, severe muscle depletion Interventions: Glucerna shake, MVI    Procedures:none Family communication :unable to reach dter CODE STATUS: full DVT Prophylaxis :enoxaparin Level of care: Med-Surg Status is: Inpatient Remains inpatient appropriate because: will continue to monitor over the weekend and hopefully sugar remains stable discharged to rehab on Monday    [place]: 25 minutes.  >50% time spent on counselling and  coordination of care  Note: This dictation was prepared with Dragon dictation along with smaller phrase technology. Any transcriptional errors that result from this process are unintentional.  Fritzi Mandes M.D    [facility]   CC: Primary care physician; Pcp, No

## 2021-08-27 NOTE — Plan of Care (Signed)

## 2021-08-27 NOTE — TOC Progression Note (Signed)
Transition of Care [facility]) - Progression Note    Patient Details  Name: AMAIRA SAFLEY MRN: [id number] Date of Birth: [birth year]  Transition of Care [facility] CM/SW Contact  Zigmund Daniel Dorian Pod, RN Phone Number:[phone] 08/27/2021, 1:24 PM  Clinical Narrative:    [facility] Portable as provided authorization til 08/28/2021 # [id number]. Alerted [facility] Langley Gauss) who requested TOC call admission office on tomorrow to confirm.  Please update pt accordingly with potential discharge arrangements once confirmed.  TOC remains available for any additional needs.   Expected Discharge Plan: [place] Barriers to Discharge: Barriers Resolved  Expected Discharge Plan and Services Expected Discharge Plan: [place] arrangements for the past 2 months: Single Family Home Expected Discharge Date: 08/25/21                                     Social Determinants of Health (SDOH) Interventions    Readmission Risk Interventions    03/06/2021    9:13 AM 02/16/2021    2:45 PM  Readmission Risk Prevention Plan  Transportation Screening Complete Complete  Medication Review ([place]) Complete Complete  PCP or Specialist appointment within 3-5 days of discharge Complete   SW Recovery Care/Counseling Consult  Complete  Palliative Care Screening Not Applicable Not [place] Not Applicable Not Applicable

## 2021-08-28 DIAGNOSIS — E1010 Type 1 diabetes mellitus with ketoacidosis without coma: Secondary | ICD-10-CM | POA: Diagnosis not present

## 2021-08-28 LAB — GLUCOSE, CAPILLARY
Glucose-Capillary: 66 mg/dL — ABNORMAL LOW (ref 70–99)
Glucose-Capillary: 131 mg/dL — ABNORMAL HIGH (ref 70–99)
Glucose-Capillary: 254 mg/dL — ABNORMAL HIGH (ref 70–99)
Glucose-Capillary: 183 mg/dL — ABNORMAL HIGH (ref 70–99)
Glucose-Capillary: 337 mg/dL — ABNORMAL HIGH (ref 70–99)

## 2021-08-28 MED ORDER — INSULIN ASPART 100 UNIT/ML IJ SOLN
0.0000 [IU] | Freq: Three times a day (TID) | INTRAMUSCULAR | Status: DC
  Administered 2021-08-28: 7 [IU] via SUBCUTANEOUS
  Administered 2021-08-28: 5 [IU] via SUBCUTANEOUS
  Filled 2021-08-28 (×2): qty 1

## 2021-08-28 MED ORDER — LANTUS SOLOSTAR 100 UNIT/ML ~~LOC~~ SOPN
8.0000 [IU] | PEN_INJECTOR | Freq: Every day | SUBCUTANEOUS | 2 refills | Status: DC
Start: 2021-08-28 — End: 2021-10-19

## 2021-08-28 MED ORDER — INSULIN ASPART 100 UNIT/ML IJ SOLN: 0.0000 [IU] | Freq: Every day | INTRAMUSCULAR | Status: DC

## 2021-08-28 MED ORDER — INSULIN ASPART 100 UNIT/ML IJ SOLN: 0.0000 [IU] | Freq: Three times a day (TID) | INTRAMUSCULAR | 11 refills | Status: DC

## 2021-08-28 NOTE — Progress Notes (Signed)
Physical Therapy Treatment Patient Details Name: Brittany [name] MRN: [id number] DOB: [birth year] Today's Date: 08/28/2021   History of Present Illness Pt. is a 61 y.o. F presenting to [facility] on 7/2 with AMS. Admitted to SDU with acute metabolic encephalopathy, severe sepsis with septic shock secondary to pneumonia likely aspiration, hypothermia, and DKA. 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    PT Comments    Pt asleep in bed but awakes with time and verbal cues.  Given cold cloth to wash face and RN to check blood sugars prior as they have a history of highs and lows affecting alertness.  She is able to get to EOB on her own and stand but uses my hand and sink for support.  She is able to complete lap on unit with impaired balance with lateral left and right sway, decreased step length left and right and has difficulty motor planning for backwards gait when asked.  She has difficulty following 2 step directions.  She stops at nursing station to "grab a coke" and begins to drink imaginary drink she sees on counter.  Tries to go into 2 patient rooms that are not hers and needs cues to come back out.  She is able to so up/down 4 steps with rail and 1 HHA but on the way down she stated she needs to urinate and begins to void on the stairs.  She walks quickly back to room after trying to enter a  storage closet with an open door she sees as a bathroom.  She lists her gown up in hallway and walks back to room with her lower half completely bare and when I tried to pull  gown down for modesty she refuses and said she does not care.  Once on commode she is able to finish voiding a large volume.  Returns to bed after session.  Discussed at length with MD and RN.  While mobility has improved, she remains with balance deficits  and general safety concerns.  At this time SNF remains appropriate for discharge as she will not have any caregivers at home and would struggle with ADL's, general safety and cooking along with colostomy care. SNF would allow for continued balance, strength and a time to secure a transition to ALF or adult home if deemed appropriate.  She is at high risk for falls and readmission if discharged home with no support.     Recommendations for follow up therapy are one component of a multi-disciplinary discharge planning process, led by the attending physician.  Recommendations may be updated based on patient status, additional functional criteria and insurance authorization.  Follow Up Recommendations  Skilled nursing-short term rehab (<3 hours/day)     Assistance Recommended at Discharge Frequent or constant Supervision/Assistance  Patient can return home with the following A little help with walking and/or transfers;A little help with bathing/dressing/bathroom;Direct supervision/assist for medications management;Direct supervision/assist for financial management;Assist for transportation;Help with stairs or ramp for entrance   Equipment Recommendations  Rolling walker (2 wheels)    Recommendations for Other Services       Precautions / Restrictions Precautions Precautions: Fall Precaution Comments: colostomy Restrictions Weight Bearing Restrictions: No     Mobility  Bed Mobility Overal bed mobility: Modified Independent Bed Mobility: Supine to Sit, Sit to Supine     Supine to sit: Modified independent (Device/Increase time) Sit  to supine: Modified independent (Device/Increase time)   General bed mobility comments: In recliner pre/post session    Transfers Overall transfer level: Modified independent Equipment used: None Transfers: Sit to/from Stand Sit to Stand: Supervision, Min guard Stand pivot transfers: Min guard         General transfer comment: CGA to stand with VC  for hand placement to improve safety    Ambulation/Gait Ambulation/Gait assistance: Min guard, Min assist Gait Distance (Feet): 200 Feet Assistive device: None, 1 person hand held assist Gait Pattern/deviations: Step-through pattern, Drifts right/left, Staggering right, Staggering left, Narrow base of support, Scissoring, Decreased step length - right, Decreased step length - left Gait velocity: decreased     General Gait Details: generally unsteady   Stairs             Wheelchair Mobility    Modified Rankin (Stroke Patients Only)       Balance Overall balance assessment: Needs assistance Sitting-balance support: Feet unsupported, Feet supported, No upper extremity supported Sitting balance-Leahy Scale: Good     Standing balance support: During functional activity, No upper extremity supported Standing balance-Leahy Scale: Poor                              Cognition Arousal/Alertness: Awake/alert Behavior During Therapy: WFL for tasks assessed/performed, Impulsive Overall Cognitive Status: Difficult to assess Area of Impairment: Following commands, Safety/judgement, Awareness, Problem solving                       Following Commands: Follows one step commands consistently, Follows multi-step commands inconsistently Safety/Judgement: Decreased awareness of safety, Decreased awareness of deficits   Problem Solving: Difficulty sequencing General Comments: lethargic initially but awakens for session with time and cues.        Exercises      General Comments        Pertinent Vitals/Pain Pain Assessment Pain Assessment: No/denies pain    Home Living                          Prior Function            PT Goals (current goals can now be found in the care plan section) Progress towards PT goals: Progressing toward goals    Frequency    Min 2X/week      PT Plan Current plan remains appropriate    Co-evaluation               AM-PAC PT "6 Clicks" Mobility   Outcome Measure  Help needed turning from your back to your side while in a flat bed without using bedrails?: None Help needed moving from lying on your back to sitting on the side of a flat bed without using bedrails?: None Help needed moving to and from a bed to a chair (including a wheelchair)?: A Little Help needed standing up from a chair using your arms (e.g., wheelchair or bedside chair)?: A Little Help needed to walk in hospital room?: A Little Help needed climbing 3-5 steps with a railing? : A Little 6 Click Score: 20    End of Session Equipment Utilized During Treatment: Gait belt Activity Tolerance: Patient tolerated treatment well Patient left: in chair;with call bell/phone within reach;with chair alarm set Nurse Communication: Mobility status PT Visit Diagnosis: Muscle weakness (generalized) (M62.81);Difficulty in walking, not elsewhere classified (R26.2)     Time: 6160-7371 PT  Time Calculation (min) (ACUTE ONLY): 18 min  Charges:  $Gait Training: 8-22 mins                   Chesley Noon, PTA 08/28/21, 11:29 AM

## 2021-08-28 NOTE — Progress Notes (Signed)
Report called to [facility] to [facility], LPN. No room # provided at this time. Transportation arranged, awaiting transport.

## 2021-08-28 NOTE — Care Management Important Message (Signed)
Important Message  Patient Details  Name: Brittany [name] MRN: [id number] Date of Birth: August 21, 1960   Medicare Important Message Given:  Yes     [name] 08/28/2021, 2:11 PM

## 2021-08-28 NOTE — Progress Notes (Signed)
[name] NAME: Brittany [name]    MR#:  [id number]  DATE OF BIRTH:  Jul 28, 1960  SUBJECTIVE:   patient seen earlier.  No new complaints.  Worked with PT earlier  VITALS:  Blood pressure 109/81, pulse 98, temperature 98.1 F (36.7 C), resp. rate 15, weight 44.9 kg, SpO2 97 %.  PHYSICAL EXAMINATION:   GENERAL:  61 y.o.-year-old patient lying in the bed with no acute distress.  LUNGS: Normal breath sounds bilaterally, no wheezing CARDIOVASCULAR: S1, S2 normal. No murmurs  ABDOMEN: Soft, nontender, nondistended. Bowel sounds present. Colostomy+ EXTREMITIES: No  edema b/l.    NEUROLOGIC: nonfocal  patient is alert and awake SKIN: No obvious rash, lesion, or ulcer.   LABORATORY PANEL:  CBC No results for input(s): "WBC", "HGB", "HCT", "PLT" in the last 168 hours.   Chemistries  Recent Labs  Lab 08/23/21 0255 08/24/21 0723 08/24/21 1148  NA 136  --   --   K 5.8*   < > 4.2  CL 105  --   --   CO2 26  --   --   GLUCOSE 256*  --   --   BUN 19  --   --   CREATININE 0.84  --   --   CALCIUM 9.1  --   --    < > = values in this interval not displayed.     Assessment and Plan  [name], is a 61 year old female with history of hypertension, poorly controlled insulin-dependent diabetes mellitus, history of left PCA territory infarct, right hemiparesis, COPD, history of pancreectomy/splenectomy, history of diverticulitis with abscess leading to colostomy in January 2023, anxiety, depression, who presented to [facility] ER via EMS on 07/2 with altered mental status.  Pts daughter reported to EMS the pt was "fine" at 08:30 am today, however she later found her on the floor prompting EMS notification.   DKA (diabetic ketoacidosis) ([place]) Labile DM-1 history of pancreatic cancer Patient admitted with DKA which has been resolved.  -- Insulin infusion has been switched with basal and short-acting. -Continue with Semglee,SSI  --d/w  RN not to give extra trays of food--give snack in between meals -- Sugars are hard to control  and very LABILE  Severe sepsis (HCC)--resolved --Patient presented with severe sepsis and septic shock secondary to pneumonia.  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  Being managed by oncology as an outpatient. -Continue with Creon   HLD (hyperlipidemia) - Continue Lipitor   Depression with anxiety - on  Zyprexa and mirtazapine    Nutrition Status: Nutrition Problem: Severe Malnutrition Etiology: chronic illness (COPD, pancreatic cancer) Signs/Symptoms: severe fat depletion, severe muscle depletion Interventions: Glucerna shake, MVI    Procedures:none Family communication :spoke with dter [name] on the phone CODE STATUS: full DVT Prophylaxis :enoxaparin Level of care: Med-Surg Status is: Inpatient Remains inpatient appropriate because: will continue to monitor over the weekend and hopefully sugar remains stable discharged to rehab on Monday    TOTAL TIME TAKING CARE OF THIS PATIENT: 25 minutes.  >50% time spent on counselling and coordination of care  Note: This dictation was prepared with Dragon dictation along with smaller phrase technology. Any transcriptional errors that result from this process are unintentional.  Fritzi Mandes M.D    [facility]  CC: Primary care physician; Pcp, No

## 2021-08-28 NOTE — Progress Notes (Signed)
Occupational Therapy Treatment Patient Details Name: Brittany [name] MRN: [id number] DOB: April 07, 1960 Today's Date: 08/28/2021   History of present illness Pt. is a 60 y.o. F presenting to [facility] on 7/2 with AMS. Admitted to SDU with acute metabolic encephalopathy, severe sepsis with septic shock secondary to pneumonia likely aspiration, hypothermia, and DKA. 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   OT comments  Upon entering the room, pt seated on EOB and agreeable to OT intervention. Pt is observed to be putting colored pencils and paper in mouth. OT asking pt if she was hungry and pt reports, " I am starving!" OT assisted pt with making PB crackers and pt feeding self. Pt then stands and ambulates 10' with supervision - min guard without use of AD to recliner chair secondary to impulsivity. Pt seated in recliner chair with chair alarm donned and call bell within reach. RN notified.    Recommendations for follow up therapy are one component of a multi-disciplinary discharge planning process, led by the attending physician.  Recommendations may be updated based on patient status, additional functional criteria and insurance authorization.    Follow Up Recommendations  Skilled nursing-short term rehab (<3 hours/day)    Assistance Recommended at Discharge Frequent or constant Supervision/Assistance  Patient can return home with the following  Assistance with cooking/housework;Assist for transportation;Help with stairs or ramp for entrance;Direct supervision/assist for medications management;Direct supervision/assist for financial management;A little help with walking and/or transfers;A little help with bathing/dressing/bathroom   Equipment Recommendations  None recommended by OT       Precautions /  Restrictions Precautions Precautions: Fall Precaution Comments: colostomy Restrictions Weight Bearing Restrictions: No       Mobility Bed Mobility Overal bed mobility: Modified Independent Bed Mobility: Supine to Sit     Supine to sit: Modified independent (Device/Increase time)          Transfers Overall transfer level: Needs assistance Equipment used: None Transfers: Sit to/from Stand, Bed to chair/wheelchair/BSC Sit to Stand: Supervision, Min guard Stand pivot transfers: Min guard         General transfer comment: CGA to stand with VC for hand placement to improve safety     Balance Overall balance assessment: Needs assistance Sitting-balance support: Feet unsupported, Feet supported, No upper extremity supported Sitting balance-Leahy Scale: Good Sitting balance - Comments: no balance deficits in sitting   Standing balance support: During functional activity, No upper extremity supported Standing balance-Leahy Scale: Fair Standing balance comment: Poor balance without UE support during dynamic task                           ADL either performed or assessed with clinical judgement    Extremity/Trunk Assessment Upper Extremity Assessment Upper Extremity Assessment: Generalized weakness   Lower Extremity Assessment Lower Extremity Assessment: Generalized weakness        Vision Patient Visual Report: No change from baseline            Cognition Arousal/Alertness: Awake/alert Behavior During Therapy: Impulsive   Area of Impairment: Following commands, Safety/judgement, Awareness, Problem solving                       Following Commands: Follows one step commands consistently, Follows multi-step commands inconsistently Safety/Judgement: Decreased awareness of safety, Decreased awareness of deficits Awareness: Intellectual Problem Solving:  Difficulty sequencing                     Pertinent Vitals/ Pain       Pain  Assessment Pain Assessment: No/denies pain         Frequency  Min 2X/week        Progress Toward Goals  OT Goals(current goals can now be found in the care plan section)  Progress towards OT goals: Progressing toward goals  Acute Rehab OT Goals Patient Stated Goal: to go home OT Goal Formulation: With patient Time For Goal Achievement: 08/29/21 Potential to Achieve Goals: Good  Plan Discharge plan remains appropriate;Frequency remains appropriate       AM-PAC OT "6 Clicks" Daily Activity     Outcome Measure   Help from another person eating meals?: None Help from another person taking care of personal grooming?: A Little Help from another person toileting, which includes using toliet, bedpan, or urinal?: A Little Help from another person bathing (including washing, rinsing, drying)?: A Little Help from another person to put on and taking off regular upper body clothing?: A Little Help from another person to put on and taking off regular lower body clothing?: A Little 6 Click Score: 19    End of Session    OT Visit Diagnosis: Other abnormalities of gait and mobility (R26.89);Muscle weakness (generalized) (M62.81)   Activity Tolerance Patient tolerated treatment well   Patient Left in chair;with call bell/phone within reach;with chair alarm set   Nurse Communication Mobility status        Time: 5102-5852 OT Time Calculation (min): 10 min  Charges: OT General Charges $OT Visit: 1 Visit OT Treatments $Self Care/Home Management : 8-22 mins  Darleen Crocker, MS, OTR/L , CBIS ascom [phone]  08/28/21, 3:03 PM

## 2021-08-28 NOTE — Discharge Summary (Addendum)
Physician Discharge Summary   [name] MRN: [id number] DOB: March 18, 1960  Admit date:     08/13/2021  Discharge date: 08/28/21  Discharge Physician: Fritzi Mandes   PCP: Pcp, No   Recommendations at discharge:    Dietitian to follow it rehab. Please do not give extra food to patient due to her Brittle diabetes. Give snack tid and at bed time. 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  Pts daughter reported to EMS the pt was "fine" at 08:30 am today, however she later found her on the floor prompting EMS notification.    DKA (diabetic ketoacidosis) ([place]) Labile DM-1 Patient admitted with DKA which has been resolved.  -- Insulin infusion has been switched with basal and short-acting. -Continue with Semglee,SSI and Aspart --diabetes coordinator following. --pt had a few hypoglycemic episodes. --Please not to give extra trays of food--give snack in between meals --  She is at a very high risk for re-admission due to her labile diabetes. --She is curretnly best at baseline.   Severe sepsis [facility]) --Patient presented with severe sepsis and septic shock secondary to pneumonia.   --completed treatment --sepsis resolved   Hyperkalemia Improved last -- potassium down to 4.2   hypotension relative -- patient is  asymptomatic.   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  Being managed by oncology as an outpatient. -Continue with Creon   HLD (hyperlipidemia) - Continue Lipitor   Depression with anxiety - cont Zyprexa and mirtazapine   Protein-calorie malnutrition, severe Estimated body mass index is 17.22 kg/m as calculated from the following:   Height as of 07/26/21: '5\' 3"'$  (1.6 m).   Weight as of this encounter: 44.1 kg.   -Dietitian consult appreciated     Family communication tspoke with dter [name] today Consults :PCCM CODE STATUS: full DVT Prophylaxis :enoxaparin      Disposition: Skilled nursing facility Diet recommendation:  Discharge Diet Orders (From admission, onward)     Start     Ordered   08/24/21 0000  Diet Carb Modified        08/24/21 1357           Carb modified diet DISCHARGE MEDICATION: Allergies as of 08/28/2021       Reactions   Cephalosporins Itching   TOLERATED ZOSYN (PIPERACILLIN) BEFORE   Hydromorphone Itching   Keflin [cephalothin] Itching   Lactose Intolerance (gi) Diarrhea        Medication List     STOP taking these medications    diclofenac Sodium 1 % Gel Commonly known as: VOLTAREN   insulin lispro 100 UNIT/ML KwikPen Commonly known as: HumaLOG KwikPen   Insulin Pen Needle 32G X 4 MM Misc   melatonin 3 MG Tabs tablet   Pancrelipase (Lip-Prot-Amyl) 24000-76000 units Cpep   polyethylene glycol 17 g  packet Commonly known as: MIRALAX / GLYCOLAX       TAKE these medications    albuterol 108 (90 Base) MCG/ACT inhaler Commonly known as: VENTOLIN HFA Inhale 2 puffs into the lungs every 6 (six) hours as needed for wheezing or shortness of breath.   aspirin EC 81 MG tablet Take 1 tablet (81 mg total) by mouth daily. Swallow whole.   atorvastatin 40 MG tablet Commonly known as: LIPITOR Take  1 tablet (40 mg total) by mouth Nightly. What changed: when to take this   camphor-menthol lotion Commonly known as: SARNA Apply topically as needed for itching. What changed: how much to take   famotidine 20 MG tablet Commonly known as: PEPCID Take 1 tablet (20 mg total) by mouth daily.   feeding supplement (GLUCERNA SHAKE) Liqd Take 237 mLs by mouth 3 (three) times daily between meals.   insulin aspart 100 UNIT/ML injection Commonly known as: novoLOG Inject 0-5 Units into the skin at bedtime.   insulin aspart 100 UNIT/ML injection Commonly known as: novoLOG Inject 0-9 Units into the skin 3 (three) times daily with meals.   Lantus SoloStar 100 UNIT/ML Solostar Pen Generic drug: insulin glargine Inject 8 Units into the skin daily. What changed:  how much to take how to take this when to take this   mirtazapine 15 MG tablet Commonly known as: REMERON Take 15 mg by mouth at bedtime.   multivitamin with minerals Tabs tablet Take 1 tablet by mouth daily.   OLANZapine 7.5 MG tablet Commonly known as: ZYPREXA Take 1 tablet (7.5 mg total) by mouth at bedtime.   rOPINIRole 0.5 MG tablet Commonly known as: REQUIP Take 0.5 mg by mouth at bedtime.        Contact information for after-discharge care     Destination     [facility] Preferred SNF .   Service: Skilled Nursing Contact information: [address] [place] [phone]                    Discharge Exam: Danley Danker Weights   08/21/21 0500 08/22/21 0500 08/27/21 0455  Weight: 41.5 kg 42.1 kg 44.9 kg     Condition at discharge: fair  The results of significant diagnostics from this hospitalization (including imaging, microbiology, ancillary and laboratory) are listed below for reference.   Imaging Studies: CT HEAD WO CONTRAST (5MM)  Result Date: 08/13/2021 CLINICAL DATA:  61 y.o. female patient with history of diabetes and recent pneumonia. She comes  in alert and disoriented and moaning. She is cold to the touch. She seems septic. No obvious source. Blood pressure initially 74/64 with a heart rate of 105 temperature of 82. Reportedly patient was found on the floor called by her daughter EXAM: CT HEAD WITHOUT CONTRAST TECHNIQUE: Contiguous axial images were obtained from the base of the skull through the vertex without intravenous contrast. RADIATION DOSE REDUCTION: This exam was performed according to the departmental dose-optimization program which includes automated exposure control, adjustment of the mA and/or kV according to patient size and/or use of iterative reconstruction technique. COMPARISON:  07/26/2021 and 06/26/2018 FINDINGS: Brain: No evidence of acute infarction, hemorrhage, hydrocephalus, extra-axial collection or mass lesion/mass effect. Old infarcts, left temporal occipital lobe, left cerebellum and small infarct in right frontal lobe, stable. Patchy white matter hypoattenuation noted more diffusely consistent with moderate chronic microvascular ischemic change. Vascular: No hyperdense vessel or unexpected calcification. Skull: Normal. Negative for fracture or focal lesion. Sinuses/Orbits: Globes and orbits  are unremarkable. Visualized sinuses are clear. Other: None. IMPRESSION: 1. No acute intracranial abnormalities. 2. Old infarcts and chronic microvascular ischemic change. Stable appearance from the prior study. Electronically Signed   By: Lajean Manes M.D.   On: 08/13/2021 16:19   DG Chest Port 1 View  Result Date: 08/13/2021 CLINICAL DATA:  Pneumonia.  Sepsis. EXAM: PORTABLE CHEST 1 VIEW COMPARISON:  07/26/2021 FINDINGS: The heart size and mediastinal contours are within normal limits. Pulmonary hyperinflation is again noted. Increased infiltrate is seen in the right lower lobe. No evidence of pleural effusion. IMPRESSION: Increased right lower lobe infiltrate, suspicious for pneumonia. Electronically Signed   By: Marlaine Hind M.D.    On: 08/13/2021 14:27    Microbiology: Results for orders placed or performed during the hospital encounter of 08/13/21  Resp Panel by RT-PCR (Flu A&B, Covid) Anterior Nasal Swab     Status: None   Collection Time: 08/13/21 11:45 AM   Specimen: Anterior Nasal Swab  Result Value Ref Range Status   SARS Coronavirus 2 by RT PCR NEGATIVE NEGATIVE Final    Comment: (NOTE) SARS-CoV-2 target nucleic acids are NOT DETECTED.  The SARS-CoV-2 RNA is generally detectable in upper respiratory specimens during the acute phase of infection. The lowest concentration of SARS-CoV-2 viral copies this assay can detect is 138 copies/mL. A negative result does not preclude SARS-Cov-2 infection and should not be used as the sole basis for treatment or other patient management decisions. A negative result may occur with  improper specimen collection/handling, submission of specimen other than nasopharyngeal swab, presence of viral mutation(s) within the areas targeted by this assay, and inadequate number of viral copies(<138 copies/mL). A negative result must be combined with clinical observations, patient history, and epidemiological information. The expected result is Negative.  Fact Sheet for Patients:  EntrepreneurPulse.com.au  Fact Sheet for Healthcare Providers:  IncredibleEmployment.be  This test is no t yet approved or cleared by the [place] FDA and  has been authorized for detection and/or diagnosis of SARS-CoV-2 by FDA under an Emergency Use Authorization (EUA). This EUA will remain  in effect (meaning this test can be used) for the duration of the COVID-19 declaration under Section 564(b)(1) of the Act, 21 U.S.C.section 360bbb-3(b)(1), unless the authorization is terminated  or revoked sooner.       Influenza A by PCR NEGATIVE NEGATIVE Final   Influenza B by PCR NEGATIVE NEGATIVE Final    Comment: (NOTE) The Xpert Xpress SARS-CoV-2/FLU/RSV plus  assay is intended as an aid in the diagnosis of influenza from Nasopharyngeal swab specimens and should not be used as a sole basis for treatment. Nasal washings and aspirates are unacceptable for Xpert Xpress SARS-CoV-2/FLU/RSV testing.  Fact Sheet for Patients: EntrepreneurPulse.com.au  Fact Sheet for Healthcare Providers: IncredibleEmployment.be  This test is not yet approved or cleared by the [place] FDA and has been authorized for detection and/or diagnosis of SARS-CoV-2 by FDA under an Emergency Use Authorization (EUA). This EUA will remain in effect (meaning this test can be used) for the duration of the COVID-19 declaration under Section 564(b)(1) of the Act, 21 U.S.C. section 360bbb-3(b)(1), unless the authorization is terminated or revoked.  Performed at [facility], [address] [place], [place] [address]   Blood Culture (routine x 2)     Status: None   Collection Time: 08/13/21 11:45 AM   Specimen: BLOOD  Result Value Ref Range Status   Specimen Description BLOOD  Final   Special Requests   Final  BOTTLES DRAWN AEROBIC ONLY Blood Culture adequate volume   Culture   Final    NO GROWTH 5 DAYS Performed at [facility], [address] [place], [place] [address]    Report Status 08/18/2021 FINAL  Final  Urine Culture     Status: None   Collection Time: 08/13/21 11:45 AM   Specimen: In/Out Cath Urine  Result Value Ref Range Status   Specimen Description   Final    IN/OUT CATH URINE Performed at [facility], [address] [place], [place] [address]    Special Requests   Final    NONE Performed at [facility], [address], [place], [place] [address]    Culture   Final    NO GROWTH Performed at [facility], [place] [address], [place], [place] [address]    Report Status 08/14/2021 FINAL  Final  Gastrointestinal Panel by PCR , Stool     Status: None   Collection  Time: 08/13/21  1:50 PM   Specimen: Stool  Result Value Ref Range Status   Campylobacter species NOT DETECTED NOT DETECTED Final   Plesimonas shigelloides NOT DETECTED NOT DETECTED Final   Salmonella species NOT DETECTED NOT DETECTED Final   Yersinia enterocolitica NOT DETECTED NOT DETECTED Final   Vibrio species NOT DETECTED NOT DETECTED Final   Vibrio cholerae NOT DETECTED NOT DETECTED Final   Enteroaggregative E coli (EAEC) NOT DETECTED NOT DETECTED Final   Enteropathogenic E coli (EPEC) NOT DETECTED NOT DETECTED Final   Enterotoxigenic E coli (ETEC) NOT DETECTED NOT DETECTED Final   Shiga like toxin producing E coli (STEC) NOT DETECTED NOT DETECTED Final   Shigella/Enteroinvasive E coli (EIEC) NOT DETECTED NOT DETECTED Final   Cryptosporidium NOT DETECTED NOT DETECTED Final   Cyclospora cayetanensis NOT DETECTED NOT DETECTED Final   Entamoeba histolytica NOT DETECTED NOT DETECTED Final   Giardia lamblia NOT DETECTED NOT DETECTED Final   Adenovirus F40/41 NOT DETECTED NOT DETECTED Final   Astrovirus NOT DETECTED NOT DETECTED Final   Norovirus GI/GII NOT DETECTED NOT DETECTED Final   Rotavirus A NOT DETECTED NOT DETECTED Final   Sapovirus (I, II, IV, and V) NOT DETECTED NOT DETECTED Final    Comment: Performed at [facility], [address] [place], [place] [address]  C Difficile Quick Screen w PCR reflex     Status: None   Collection Time: 08/13/21  1:50 PM   Specimen: Stool  Result Value Ref Range Status   C Diff antigen NEGATIVE NEGATIVE Final   C Diff toxin NEGATIVE NEGATIVE Final   C Diff interpretation No C. difficile detected.  Final    Comment: Performed at [facility], [address] [place], [place] [address]  MRSA Next Gen by PCR, Nasal     Status: None   Collection Time: 08/13/21  5:30 PM   Specimen: Nasal Mucosa; Nasal Swab  Result Value Ref Range Status   MRSA by PCR Next Gen NOT DETECTED NOT DETECTED Final    Comment: (NOTE) The GeneXpert  MRSA Assay (FDA approved for NASAL specimens only), is one component of a comprehensive MRSA colonization surveillance program. It is not intended to diagnose MRSA infection nor to guide or monitor treatment for MRSA infections. Test performance is not FDA approved in patients less than 63 years old. Performed at [facility], [address] [place], [place] [address]   Culture, blood (Routine X 2) w Reflex to ID Panel  Status: None   Collection Time: 08/14/21  1:52 AM   Specimen: BLOOD  Result Value Ref Range Status   Specimen Description BLOOD LEFT HAND  Final   Special Requests   Final    BOTTLES DRAWN AEROBIC AND ANAEROBIC Blood Culture adequate volume   Culture   Final    NO GROWTH 5 DAYS Performed at [facility], [address] [place], [place] [address]    Report Status 08/19/2021 FINAL  Final    Labs: CBC: No results for input(s): "WBC", "NEUTROABS", "HGB", "HCT", "MCV", "PLT" in the last 168 hours.  Basic Metabolic Panel: Recent Labs  Lab 08/22/21 0533 08/22/21 1438 08/22/21 1740 08/23/21 0255 08/24/21 0723 08/24/21 1148  NA 130*  --   --  136  --   --   K 5.9*  --  5.1 5.8* 5.7* 4.2  CL 97*  --   --  105  --   --   CO2 24  --   --  26  --   --   GLUCOSE 529* 44*  --  256*  --   --   BUN 18  --   --  19  --   --   CREATININE 0.81  --   --  0.84  --   --   CALCIUM 9.2  --   --  9.1  --   --     Liver Function Tests: No results for input(s): "AST", "ALT", "ALKPHOS", "BILITOT", "PROT", "ALBUMIN" in the last 168 hours. CBG: Recent Labs  Lab 08/27/21 2057 08/28/21 0308 08/28/21 0757 08/28/21 1044 08/28/21 1140  GLUCAP 402* 131* 254* 183* 337*     Discharge time spent: greater than 30 minutes.  Signed: Fritzi Mandes, MD [facility] 08/28/2021

## 2021-08-28 NOTE — Inpatient Diabetes Management (Addendum)
Inpatient Diabetes Program Recommendations  AACE/ADA: New Consensus Statement on Inpatient Glycemic Control (2015)  Target Ranges:  Prepandial:   less than 140 mg/dL      Peak postprandial:   less than 180 mg/dL (1-2 hours)      Critically ill patients:  140 - 180 mg/dL    Latest Reference Range & Units 08/27/21 07:35 08/27/21 11:12 08/27/21 12:28 08/27/21 14:06 08/27/21 15:44 08/27/21 17:14 08/27/21 18:46 08/27/21 20:57  Glucose-Capillary 70 - 99 mg/dL 338 (H)  7 units Novolog  8 units Semglee '@0936'$   529 (HH)  9 units Novolog  >600 (HH)  15 units Novolog  527 (HH)  5 units Novolog  226 (H)  3 units Novolog  131 (H) 152 (H) 402 (H)  20 units Novolog     Latest Reference Range & Units 08/28/21 07:57  Glucose-Capillary 70 - 99 mg/dL 254 (H)  (H): Data is abnormally high    Home DM Meds: Lantus 10 units qhs     Humalog 5 units tid   Current Orders: Semglee 8 units daily        Novolog Resistant Correction Scale/ SSI (0-20 units) TID AC + HS    MD- Note severe lability of pt's CBGs.  It appears pt is eating 100% of her meals and consuming Glucerna PO supps TID between meals.  It looks like CBGs rise sharply after meals, however, we need to be cautious and not overdose pt with Novolog as she is quite sensitive to insulin  Please consider:  1. Reduce the Novolog SSI to the 0-9 unit (Sensitive scale) TID AC + HS  2. Start Novolog Meal Coverage back at lower dose of 3 units TID with meals HOLD if pt eats <50% meals    --Will follow patient during hospitalization--  Brittany Quaker RN, MSN, CDE Diabetes Coordinator Inpatient Glycemic Control Team Team Pager: [phone] (8a-5p)

## 2021-08-28 NOTE — TOC Transition Note (Addendum)
Transition of Care [facility]) - CM/SW Discharge Note   Patient Details  Name: Brittany [name] MRN: [id number] Date of Birth: [birth year]  [facility] CM/SW Contact:  Eileen Stanford, LCSW Phone Number: 08/28/2021, 2:26 PM   Clinical Narrative:   Clinical Social Worker facilitated patient discharge including contacting patient family and facility to confirm patient discharge plans.  Clinical information faxed to facility and family agreeable with plan.  CSW arranged ambulance transport via [facility] to [facility] room701.  RN to call [phone] for report prior to discharge.  Per PT note pt can transfer by private vehicle however, CSW called pt's mother and she states she doesn't drive and she said pt's daughter doesn't have a care. CSW explained they may get a bill for EMS. Pt' mother agreeable. EMS arranged.    Final next level of care: Skilled Nursing Facility Barriers to Discharge: No Barriers Identified   Patient Goals and CMS Choice Patient states their goals for this hospitalization and ongoing recovery are:: to go home CMS Medicare.gov Compare Post Acute Care list provided to:: Patient Choice offered to / list presented to : Adult Children  Discharge Placement   Existing PASRR number confirmed : 08/17/21          Patient chooses bed at:  ([facility]) Patient to be transferred to facility by: [facility] Name of family member notified: mother Patient and family notified of of transfer: 08/28/21  Discharge Plan and Services                                     Social Determinants of Health (SDOH) Interventions     Readmission Risk Interventions    03/06/2021    9:13 AM 02/16/2021    2:45 PM  Readmission Risk Prevention Plan  Transportation Screening Complete Complete  Medication Review ([place]) Complete Complete  PCP or Specialist appointment within 3-5 days of discharge Complete   SW Recovery Care/Counseling Consult  Complete  Palliative  Care Screening Not Applicable Not [place] Not Applicable Not Applicable

## 2021-08-28 NOTE — Plan of Care (Signed)
  Problem: Fluid Volume: Goal: Ability to maintain a balanced intake and output will improve Outcome: Progressing   Problem: Clinical Measurements: Goal: Will remain free from infection Outcome: Progressing Goal: Respiratory complications will improve Outcome: Progressing Goal: Cardiovascular complication will be avoided Outcome: Progressing   Problem: Activity: Goal: Risk for activity intolerance will decrease Outcome: Progressing   Problem: Nutrition: Goal: Adequate nutrition will be maintained Outcome: Progressing   Problem: Elimination: Goal: Will not experience complications related to bowel motility Outcome: Progressing Goal: Will not experience complications related to urinary retention Outcome: Progressing   Problem: Pain Managment: Goal: General experience of comfort will improve Outcome: Progressing   Problem: Skin Integrity: Goal: Risk for impaired skin integrity will decrease Outcome: Progressing   Problem: Education: Goal: Ability to describe self-care measures that may prevent or decrease complications (Diabetes Survival Skills Education) will improve Outcome: Not Progressing   Problem: Coping: Goal: Ability to adjust to condition or change in health will improve Outcome: Not Progressing   Problem: Health Behavior/Discharge Planning: Goal: Ability to identify and utilize available resources and services will improve Outcome: Not Progressing Goal: Ability to manage health-related needs will improve Outcome: Not Progressing   Problem: Metabolic: Goal: Ability to maintain appropriate glucose levels will improve Outcome: Not Progressing   Problem: Nutritional: Goal: Maintenance of adequate nutrition will improve Outcome: Not Progressing   Problem: Education: Goal: Knowledge of General Education information will improve Description: Including pain rating scale, medication(s)/side effects and non-pharmacologic comfort measures Outcome: Not  Progressing   Problem: Health Behavior/Discharge Planning: Goal: Ability to manage health-related needs will improve Outcome: Not Progressing   Problem: Clinical Measurements: Goal: Ability to maintain clinical measurements within normal limits will improve Outcome: Not Progressing Goal: Diagnostic test results will improve Outcome: Not Progressing   Problem: Coping: Goal: Level of anxiety will decrease Outcome: Not Progressing   Problem: Safety: Goal: Ability to remain free from injury will improve Outcome: Not Progressing

## 2021-08-28 NOTE — Plan of Care (Signed)

## 2021-08-31 ENCOUNTER — Other Ambulatory Visit: Payer: Self-pay | Admitting: Internal Medicine

## 2021-08-31 MED ORDER — INSULIN LISPRO 100 UNIT/ML IJ SOLN: INTRAMUSCULAR | 11 refills | Status: DC

## 2021-09-04 ENCOUNTER — Emergency Department: Payer: Medicare Other

## 2021-09-04 ENCOUNTER — Inpatient Hospital Stay
Admission: EM | Admit: 2021-09-04 | Discharge: 2021-09-12 | DRG: 637 | Disposition: A | Discharge status: Home / Self Care | Payer: Medicare Other | Source: Skilled Nursing Facility | Attending: Internal Medicine | Admitting: Internal Medicine

## 2021-09-04 ENCOUNTER — Other Ambulatory Visit: Payer: Self-pay

## 2021-09-04 DIAGNOSIS — Z7982 Long term (current) use of aspirin: Secondary | ICD-10-CM

## 2021-09-04 DIAGNOSIS — E162 Hypoglycemia, unspecified: Secondary | ICD-10-CM

## 2021-09-04 DIAGNOSIS — Z885 Allergy status to narcotic agent status: Secondary | ICD-10-CM | POA: Diagnosis not present

## 2021-09-04 DIAGNOSIS — Z933 Colostomy status: Secondary | ICD-10-CM

## 2021-09-04 DIAGNOSIS — E43 Unspecified severe protein-calorie malnutrition: Secondary | ICD-10-CM | POA: Diagnosis present

## 2021-09-04 DIAGNOSIS — Z8673 Personal history of transient ischemic attack (TIA), and cerebral infarction without residual deficits: Secondary | ICD-10-CM

## 2021-09-04 DIAGNOSIS — E875 Hyperkalemia: Secondary | ICD-10-CM | POA: Diagnosis not present

## 2021-09-04 DIAGNOSIS — Z818 Family history of other mental and behavioral disorders: Secondary | ICD-10-CM | POA: Diagnosis not present

## 2021-09-04 DIAGNOSIS — Z9049 Acquired absence of other specified parts of digestive tract: Secondary | ICD-10-CM

## 2021-09-04 DIAGNOSIS — I10 Essential (primary) hypertension: Secondary | ICD-10-CM | POA: Diagnosis present

## 2021-09-04 DIAGNOSIS — R4182 Altered mental status, unspecified: Secondary | ICD-10-CM | POA: Diagnosis not present

## 2021-09-04 DIAGNOSIS — G9340 Encephalopathy, unspecified: Secondary | ICD-10-CM | POA: Diagnosis present

## 2021-09-04 DIAGNOSIS — E872 Acidosis, unspecified: Secondary | ICD-10-CM | POA: Diagnosis present

## 2021-09-04 DIAGNOSIS — F32A Depression, unspecified: Secondary | ICD-10-CM | POA: Diagnosis present

## 2021-09-04 DIAGNOSIS — E11649 Type 2 diabetes mellitus with hypoglycemia without coma: Secondary | ICD-10-CM | POA: Diagnosis not present

## 2021-09-04 DIAGNOSIS — K8689 Other specified diseases of pancreas: Secondary | ICD-10-CM | POA: Diagnosis present

## 2021-09-04 DIAGNOSIS — G9341 Metabolic encephalopathy: Secondary | ICD-10-CM | POA: Diagnosis present

## 2021-09-04 DIAGNOSIS — J189 Pneumonia, unspecified organism: Secondary | ICD-10-CM | POA: Diagnosis not present

## 2021-09-04 DIAGNOSIS — J440 Chronic obstructive pulmonary disease with acute lower respiratory infection: Secondary | ICD-10-CM | POA: Diagnosis present

## 2021-09-04 DIAGNOSIS — Z794 Long term (current) use of insulin: Secondary | ICD-10-CM

## 2021-09-04 DIAGNOSIS — Z881 Allergy status to other antibiotic agents status: Secondary | ICD-10-CM

## 2021-09-04 DIAGNOSIS — Z87891 Personal history of nicotine dependence: Secondary | ICD-10-CM

## 2021-09-04 DIAGNOSIS — M329 Systemic lupus erythematosus, unspecified: Secondary | ICD-10-CM | POA: Diagnosis present

## 2021-09-04 DIAGNOSIS — Z681 Body mass index (BMI) 19 or less, adult: Secondary | ICD-10-CM | POA: Diagnosis not present

## 2021-09-04 DIAGNOSIS — Z9081 Acquired absence of spleen: Secondary | ICD-10-CM

## 2021-09-04 DIAGNOSIS — D136 Benign neoplasm of pancreas: Secondary | ICD-10-CM | POA: Diagnosis present

## 2021-09-04 DIAGNOSIS — G2581 Restless legs syndrome: Secondary | ICD-10-CM | POA: Diagnosis present

## 2021-09-04 DIAGNOSIS — Z79899 Other long term (current) drug therapy: Secondary | ICD-10-CM

## 2021-09-04 DIAGNOSIS — D649 Anemia, unspecified: Secondary | ICD-10-CM | POA: Diagnosis present

## 2021-09-04 DIAGNOSIS — E1165 Type 2 diabetes mellitus with hyperglycemia: Secondary | ICD-10-CM | POA: Diagnosis not present

## 2021-09-04 DIAGNOSIS — Z90411 Acquired partial absence of pancreas: Secondary | ICD-10-CM

## 2021-09-04 DIAGNOSIS — R7989 Other specified abnormal findings of blood chemistry: Secondary | ICD-10-CM | POA: Diagnosis not present

## 2021-09-04 DIAGNOSIS — Z8619 Personal history of other infectious and parasitic diseases: Secondary | ICD-10-CM

## 2021-09-04 DIAGNOSIS — D490 Neoplasm of unspecified behavior of digestive system: Secondary | ICD-10-CM | POA: Diagnosis present

## 2021-09-04 DIAGNOSIS — Z803 Family history of malignant neoplasm of breast: Secondary | ICD-10-CM | POA: Diagnosis not present

## 2021-09-04 DIAGNOSIS — D75839 Thrombocytosis, unspecified: Secondary | ICD-10-CM | POA: Diagnosis present

## 2021-09-04 DIAGNOSIS — F419 Anxiety disorder, unspecified: Secondary | ICD-10-CM | POA: Diagnosis present

## 2021-09-04 DIAGNOSIS — E785 Hyperlipidemia, unspecified: Secondary | ICD-10-CM | POA: Diagnosis present

## 2021-09-04 LAB — CBC WITH DIFFERENTIAL/PLATELET
Abs Immature Granulocytes: 0.07 10*3/uL (ref 0.00–0.07)
Basophils Absolute: 0.1 10*3/uL (ref 0.0–0.1)
Basophils Relative: 1 %
Eosinophils Absolute: 0.1 10*3/uL (ref 0.0–0.5)
Eosinophils Relative: 1 %
HCT: 35.1 % — ABNORMAL LOW (ref 36.0–46.0)
Hemoglobin: 10.7 g/dL — ABNORMAL LOW (ref 12.0–15.0)
Immature Granulocytes: 1 %
Lymphocytes Relative: 52 %
Lymphs Abs: 4.8 10*3/uL — ABNORMAL HIGH (ref 0.7–4.0)
MCH: 28.5 pg (ref 26.0–34.0)
MCHC: 30.5 g/dL (ref 30.0–36.0)
MCV: 93.4 fL (ref 80.0–100.0)
Monocytes Absolute: 0.8 10*3/uL (ref 0.1–1.0)
Monocytes Relative: 9 %
Neutro Abs: 3.2 10*3/uL (ref 1.7–7.7)
Neutrophils Relative %: 36 %
Platelets: 582 10*3/uL — ABNORMAL HIGH (ref 150–400)
RBC: 3.76 MIL/uL — ABNORMAL LOW (ref 3.87–5.11)
RDW: 19.9 % — ABNORMAL HIGH (ref 11.5–15.5)
WBC: 8.9 10*3/uL (ref 4.0–10.5)
nRBC: 0.2 % (ref 0.0–0.2)

## 2021-09-04 LAB — LACTIC ACID, PLASMA
Lactic Acid, Venous: 1.4 mmol/L (ref 0.5–1.9)
Lactic Acid, Venous: 2.5 mmol/L (ref 0.5–1.9)

## 2021-09-04 LAB — CBG MONITORING, ED
Glucose-Capillary: 189 mg/dL — ABNORMAL HIGH (ref 70–99)
Glucose-Capillary: 28 mg/dL — CL (ref 70–99)
Glucose-Capillary: 115 mg/dL — ABNORMAL HIGH (ref 70–99)
Glucose-Capillary: 129 mg/dL — ABNORMAL HIGH (ref 70–99)
Glucose-Capillary: 246 mg/dL — ABNORMAL HIGH (ref 70–99)
Glucose-Capillary: 354 mg/dL — ABNORMAL HIGH (ref 70–99)
Glucose-Capillary: 326 mg/dL — ABNORMAL HIGH (ref 70–99)

## 2021-09-04 LAB — COMPREHENSIVE METABOLIC PANEL
ALT: 113 U/L — ABNORMAL HIGH (ref 0–44)
AST: 80 U/L — ABNORMAL HIGH (ref 15–41)
Albumin: 3.5 g/dL (ref 3.5–5.0)
Alkaline Phosphatase: 168 U/L — ABNORMAL HIGH (ref 38–126)
Anion gap: 8 (ref 5–15)
BUN: 20 mg/dL (ref 8–23)
CO2: 16 mmol/L — ABNORMAL LOW (ref 22–32)
Calcium: 9.8 mg/dL (ref 8.9–10.3)
Chloride: 119 mmol/L — ABNORMAL HIGH (ref 98–111)
Creatinine, Ser: 0.9 mg/dL (ref 0.44–1.00)
GFR, Estimated: >60 mL/min (ref >60)
Glucose, Bld: 58 mg/dL — ABNORMAL LOW (ref 70–99)
Potassium: 4.9 mmol/L (ref 3.5–5.1)
Sodium: 143 mmol/L (ref 135–145)
Total Bilirubin: 0.4 mg/dL (ref 0.3–1.2)
Total Protein: 8.4 g/dL — ABNORMAL HIGH (ref 6.5–8.1)

## 2021-09-04 LAB — TROPONIN I (HIGH SENSITIVITY): Troponin I (High Sensitivity): 9 ng/L (ref <18)

## 2021-09-04 LAB — GLUCOSE, CAPILLARY: Glucose-Capillary: 350 mg/dL — ABNORMAL HIGH (ref 70–99)

## 2021-09-04 MED ORDER — LEVOFLOXACIN IN D5W 750 MG/150ML IV SOLN: 750.0000 mg | INTRAVENOUS | Status: DC

## 2021-09-04 MED ORDER — MIRTAZAPINE 15 MG PO TABS
15.0000 mg | ORAL_TABLET | Freq: Every day | ORAL | Status: DC
Start: 2021-09-04 — End: 2021-09-12
  Administered 2021-09-04 – 2021-09-11 (×8): 15 mg via ORAL
  Filled 2021-09-04 (×8): qty 1

## 2021-09-04 MED ORDER — SODIUM CHLORIDE 0.9 % IV SOLN
500.0000 mg | INTRAVENOUS | Status: AC
  Administered 2021-09-04 – 2021-09-08 (×5): 500 mg via INTRAVENOUS
  Filled 2021-09-04 (×4): qty 5
  Filled 2021-09-04: qty 500

## 2021-09-04 MED ORDER — MAGNESIUM HYDROXIDE 400 MG/5ML PO SUSP: 30.0000 mL | Freq: Every day | ORAL | Status: DC | PRN

## 2021-09-04 MED ORDER — ORAL CARE MOUTH RINSE: 15.0000 mL | OROMUCOSAL | Status: DC | PRN

## 2021-09-04 MED ORDER — ATORVASTATIN CALCIUM 20 MG PO TABS: 40.0000 mg | ORAL_TABLET | Freq: Every day | ORAL | Status: DC

## 2021-09-04 MED ORDER — INSULIN ASPART 100 UNIT/ML IJ SOLN
0.0000 [IU] | Freq: Three times a day (TID) | INTRAMUSCULAR | Status: DC
  Administered 2021-09-05: 3 [IU] via SUBCUTANEOUS
  Administered 2021-09-05: 2 [IU] via SUBCUTANEOUS
  Administered 2021-09-05: 7 [IU] via SUBCUTANEOUS
  Administered 2021-09-06: 9 [IU] via SUBCUTANEOUS
  Administered 2021-09-06: 2 [IU] via SUBCUTANEOUS
  Administered 2021-09-06: 3 [IU] via SUBCUTANEOUS
  Administered 2021-09-07 (×3): 9 [IU] via SUBCUTANEOUS
  Administered 2021-09-09: 15 [IU] via SUBCUTANEOUS
  Administered 2021-09-09: 1 [IU] via SUBCUTANEOUS
  Administered 2021-09-10: 9 [IU] via SUBCUTANEOUS
  Administered 2021-09-10: 2 [IU] via SUBCUTANEOUS
  Administered 2021-09-11: 9 [IU] via SUBCUTANEOUS
  Filled 2021-09-04 (×14): qty 1

## 2021-09-04 MED ORDER — SODIUM CHLORIDE 0.9 % IV SOLN
INTRAVENOUS | Status: DC
Start: 2021-09-04 — End: 2021-09-07

## 2021-09-04 MED ORDER — OLANZAPINE 7.5 MG PO TABS
7.5000 mg | ORAL_TABLET | Freq: Every day | ORAL | Status: DC
  Administered 2021-09-04 – 2021-09-11 (×8): 7.5 mg via ORAL
  Filled 2021-09-04 (×9): qty 1

## 2021-09-04 MED ORDER — ROPINIROLE HCL 1 MG PO TABS
0.5000 mg | ORAL_TABLET | Freq: Every day | ORAL | Status: DC
Start: 2021-09-04 — End: 2021-09-12
  Administered 2021-09-04 – 2021-09-11 (×8): 0.5 mg via ORAL
  Filled 2021-09-04 (×3): qty 1
  Filled 2021-09-04: qty 2
  Filled 2021-09-04 (×5): qty 1

## 2021-09-04 MED ORDER — ACETAMINOPHEN 650 MG RE SUPP: 650.0000 mg | Freq: Four times a day (QID) | RECTAL | Status: DC | PRN

## 2021-09-04 MED ORDER — SODIUM CHLORIDE 0.9 % IV BOLUS
1000.0000 mL | Freq: Once | INTRAVENOUS | Status: AC
  Administered 2021-09-04: 1000 mL via INTRAVENOUS

## 2021-09-04 MED ORDER — ADULT MULTIVITAMIN W/MINERALS CH
1.0000 | ORAL_TABLET | Freq: Every day | ORAL | Status: DC
  Administered 2021-09-05 – 2021-09-12 (×8): 1 via ORAL
  Filled 2021-09-04 (×8): qty 1

## 2021-09-04 MED ORDER — GUAIFENESIN ER 600 MG PO TB12
600.0000 mg | ORAL_TABLET | Freq: Two times a day (BID) | ORAL | Status: DC
  Administered 2021-09-04 – 2021-09-12 (×16): 600 mg via ORAL
  Filled 2021-09-04 (×16): qty 1

## 2021-09-04 MED ORDER — SODIUM CHLORIDE 0.9 % IV SOLN
2.0000 g | Freq: Once | INTRAVENOUS | Status: AC
  Administered 2021-09-04: 2 g via INTRAVENOUS
  Filled 2021-09-04: qty 12.5

## 2021-09-04 MED ORDER — SODIUM CHLORIDE 0.9 % IV SOLN
1.0000 g | INTRAVENOUS | Status: DC
  Administered 2021-09-05 – 2021-09-09 (×5): 1 g via INTRAVENOUS
  Filled 2021-09-04: qty 1
  Filled 2021-09-04 (×4): qty 10

## 2021-09-04 MED ORDER — FAMOTIDINE 20 MG PO TABS
20.0000 mg | ORAL_TABLET | Freq: Every day | ORAL | Status: DC
  Administered 2021-09-04 – 2021-09-12 (×9): 20 mg via ORAL
  Filled 2021-09-04 (×8): qty 1

## 2021-09-04 MED ORDER — ACETAMINOPHEN 325 MG PO TABS
650.0000 mg | ORAL_TABLET | Freq: Four times a day (QID) | ORAL | Status: DC | PRN
  Administered 2021-09-06 – 2021-09-12 (×5): 650 mg via ORAL
  Filled 2021-09-04 (×5): qty 2

## 2021-09-04 MED ORDER — INSULIN ASPART 100 UNIT/ML IJ SOLN
0.0000 [IU] | Freq: Every day | INTRAMUSCULAR | Status: DC
  Administered 2021-09-04: 4 [IU] via SUBCUTANEOUS
  Administered 2021-09-07: 3 [IU] via SUBCUTANEOUS
  Administered 2021-09-08 – 2021-09-09 (×2): 5 [IU] via SUBCUTANEOUS
  Filled 2021-09-04 (×4): qty 1

## 2021-09-04 MED ORDER — ONDANSETRON HCL 4 MG/2ML IJ SOLN: 4.0000 mg | Freq: Four times a day (QID) | INTRAMUSCULAR | Status: DC | PRN

## 2021-09-04 MED ORDER — ENOXAPARIN SODIUM 30 MG/0.3ML IJ SOSY
30.0000 mg | PREFILLED_SYRINGE | INTRAMUSCULAR | Status: DC
  Administered 2021-09-04 – 2021-09-05 (×2): 30 mg via SUBCUTANEOUS
  Filled 2021-09-04 (×2): qty 0.3

## 2021-09-04 MED ORDER — ENOXAPARIN SODIUM 40 MG/0.4ML IJ SOSY: 40.0000 mg | PREFILLED_SYRINGE | INTRAMUSCULAR | Status: DC

## 2021-09-04 MED ORDER — ONDANSETRON HCL 4 MG PO TABS: 4.0000 mg | ORAL_TABLET | Freq: Four times a day (QID) | ORAL | Status: DC | PRN

## 2021-09-04 MED ORDER — HALOPERIDOL LACTATE 5 MG/ML IJ SOLN
2.0000 mg | Freq: Four times a day (QID) | INTRAMUSCULAR | Status: DC | PRN
  Administered 2021-09-05: 2 mg via INTRAMUSCULAR
  Filled 2021-09-04: qty 1

## 2021-09-04 MED ORDER — TRAZODONE HCL 50 MG PO TABS
25.0000 mg | ORAL_TABLET | Freq: Every evening | ORAL | Status: DC | PRN
  Administered 2021-09-05 – 2021-09-10 (×4): 25 mg via ORAL
  Filled 2021-09-04 (×4): qty 1

## 2021-09-04 MED ORDER — IOHEXOL 300 MG/ML  SOLN
100.0000 mL | Freq: Once | INTRAMUSCULAR | Status: AC | PRN
  Administered 2021-09-04: 100 mL via INTRAVENOUS

## 2021-09-04 MED ORDER — ASPIRIN 81 MG PO TBEC
81.0000 mg | DELAYED_RELEASE_TABLET | Freq: Every day | ORAL | Status: DC
  Administered 2021-09-04 – 2021-09-12 (×8): 81 mg via ORAL
  Filled 2021-09-04 (×8): qty 1

## 2021-09-04 MED ORDER — VANCOMYCIN HCL IN DEXTROSE 1-5 GM/200ML-% IV SOLN
1000.0000 mg | Freq: Once | INTRAVENOUS | Status: AC
  Administered 2021-09-04: 1000 mg via INTRAVENOUS

## 2021-09-04 NOTE — Assessment & Plan Note (Addendum)
-    hold off statin therapy. - Monitor LFTs

## 2021-09-04 NOTE — Progress Notes (Signed)
Lab called with Lactic acid of 2.5; care nurse, Valentino Hue, RN notified/acknowledged. Barbaraann Faster, RN 9:23 PM; 09/04/2021

## 2021-09-04 NOTE — ED Notes (Addendum)
1 amp D50 given IV by this RN, MD Isaacs at bedside

## 2021-09-04 NOTE — ED Notes (Signed)
[facility] PENDING  CONSULT ?

## 2021-09-04 NOTE — Assessment & Plan Note (Addendum)
-   continue her Zyprexa and Remeron. - Continue Remeron, trazodone, Zyprexa and Ativan per psychiatry

## 2021-09-04 NOTE — Assessment & Plan Note (Addendum)
-   Completed course of Rocephin and Zithromax.
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
## 2021-09-04 NOTE — ED Notes (Signed)
Unable to collect second set of blood cultures- MD Ellender Hose aware

## 2021-09-04 NOTE — ED Notes (Signed)
Patient admitted medically/ Legal paper work sent with chart

## 2021-09-04 NOTE — Progress Notes (Signed)
PHARMACIST - PHYSICIAN COMMUNICATION  CONCERNING:  Enoxaparin (Lovenox) for DVT Prophylaxis    RECOMMENDATION: Patient was prescribed enoxaprin '40mg'$  q24 hours for VTE prophylaxis.   There were no vitals filed for this visit.  There is no height or weight on file to calculate BMI.  Estimated Creatinine Clearance: 46.5 mL/min (by C-G formula based on SCr of 0.9 mg/dL).   Patient is candidate for enoxaparin '30mg'$  every 24 hours based on CrCl <64m/min or Weight <45kg  DESCRIPTION: [facility]  Patient is now receiving enoxaparin 30 mg every 24 hours    LVira Blanco PharmD Clinical Pharmacist  09/04/2021 5:26 PM

## 2021-09-04 NOTE — Progress Notes (Signed)
PHARMACY -  BRIEF ANTIBIOTIC NOTE   Pharmacy has received consult(s) for vancomycin and cefepime from an ED provider.  The patient's profile has been reviewed for ht/wt/allergies/indication/available labs.    One time order(s) placed for vancomycin 1,000 mg x 1 and cefepime 2 grams x 1  Noted allergy to cephalosporins (itching) reported in 2019. Has tolerated cefepime in June 2023 and July 2023  Further antibiotics/pharmacy consults should be ordered by admitting physician if indicated.                       Thank you, Wynelle Cleveland 09/04/2021  3:23 PM
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
## 2021-09-04 NOTE — Assessment & Plan Note (Addendum)
-    hold off statin therapy given elevated transaminases.

## 2021-09-04 NOTE — Assessment & Plan Note (Addendum)
-   Continue long-acting insulin.  And add NovoLog 3 units 3 times daily with each meals - continue supplement coverage with NovoLog.

## 2021-09-04 NOTE — Assessment & Plan Note (Signed)
-   This is this likely secondary to hypoglycemia and exacerbated by suspected possible pneumonia. - The patient be admitted to a medical telemetry bed. - We will hold off long-acting insulin. - She will be placed on supplement coverage with NovoLog. - We will manage her pneumonia as below.

## 2021-09-05 DIAGNOSIS — G9340 Encephalopathy, unspecified: Secondary | ICD-10-CM | POA: Diagnosis not present

## 2021-09-05 DIAGNOSIS — K8689 Other specified diseases of pancreas: Secondary | ICD-10-CM | POA: Diagnosis present

## 2021-09-05 LAB — CBC
HCT: 31.3 % — ABNORMAL LOW (ref 36.0–46.0)
Hemoglobin: 10.1 g/dL — ABNORMAL LOW (ref 12.0–15.0)
MCH: 29.5 pg (ref 26.0–34.0)
MCHC: 32.3 g/dL (ref 30.0–36.0)
MCV: 91.5 fL (ref 80.0–100.0)
Platelets: 522 10*3/uL — ABNORMAL HIGH (ref 150–400)
RBC: 3.42 MIL/uL — ABNORMAL LOW (ref 3.87–5.11)
RDW: 20 % — ABNORMAL HIGH (ref 11.5–15.5)
WBC: 7.5 10*3/uL (ref 4.0–10.5)
nRBC: 0.3 % — ABNORMAL HIGH (ref 0.0–0.2)

## 2021-09-05 LAB — BASIC METABOLIC PANEL
Anion gap: 4 — ABNORMAL LOW (ref 5–15)
BUN: 16 mg/dL (ref 8–23)
CO2: 18 mmol/L — ABNORMAL LOW (ref 22–32)
Calcium: 9.1 mg/dL (ref 8.9–10.3)
Chloride: 115 mmol/L — ABNORMAL HIGH (ref 98–111)
Creatinine, Ser: 0.8 mg/dL (ref 0.44–1.00)
GFR, Estimated: >60 mL/min (ref >60)
Glucose, Bld: 325 mg/dL — ABNORMAL HIGH (ref 70–99)
Potassium: 5.1 mmol/L (ref 3.5–5.1)
Sodium: 137 mmol/L (ref 135–145)

## 2021-09-05 LAB — GLUCOSE, CAPILLARY
Glucose-Capillary: 206 mg/dL — ABNORMAL HIGH (ref 70–99)
Glucose-Capillary: 193 mg/dL — ABNORMAL HIGH (ref 70–99)
Glucose-Capillary: 329 mg/dL — ABNORMAL HIGH (ref 70–99)
Glucose-Capillary: 128 mg/dL — ABNORMAL HIGH (ref 70–99)

## 2021-09-05 NOTE — Inpatient Diabetes Management (Signed)
Inpatient Diabetes Program Recommendations  AACE/ADA: New Consensus Statement on Inpatient Glycemic Control (2015)  Target Ranges:  Prepandial:   less than 140 mg/dL      Peak postprandial:   less than 180 mg/dL (1-2 hours)      Critically ill patients:  140 - 180 mg/dL   Lab Results  Component Value Date   GLUCAP 206 (H) 09/05/2021   HGBA1C >15.5 (H) 08/13/2021    Review of Glycemic Control  Latest Reference Range & Units 09/04/21 14:55 09/04/21 16:25 09/04/21 17:29 09/04/21 21:15 09/05/21 08:53  Glucose-Capillary 70 - 99 mg/dL 246 (H) 354 (H) 326 (H) 350 (H) 206 (H)  (H): Data is abnormally high  Diabetes history: DM2 Outpatient Diabetes medications: Lantus 8 units QD, Humalog SSI Current orders for Inpatient glycemic control: Novolog 0-9 units TID and 0-5 QHS  History of pancreatectomy   Inpatient Diabetes Program Recommendations:    Semglee 5 units QD (48 kg x 0.1 units)  Will continue to follow while inpatient.  Thank you, Reche Dixon, MSN, [place] Diabetes Coordinator Inpatient Diabetes Program [phone] (team pager from 8a-5p)

## 2021-09-05 NOTE — Progress Notes (Signed)
Mobility Specialist - Progress Note   09/05/21 1508  Mobility  Activity Ambulated independently in hallway  Level of Assistance Independent  Assistive Device None  Distance Ambulated (ft) 360 ft  Activity Response Tolerated well  $Mobility charge 1 Mobility    Pt was standing in room on RA upon arrival. Pt voiced no complaints or pain. Pt ambulates indep around NS. Pt forgot she had eaten breakfast/lunch. Nurse confirmed with her that she has. Pt left in bed with needs in reach.   Gretchen Short  Mobility Specialist  09/05/21 3:10 PM
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
## 2021-09-05 NOTE — Plan of Care (Signed)
  Problem: Activity: Goal: Ability to tolerate increased activity will improve Outcome: Progressing   Problem: Clinical Measurements: Goal: Ability to maintain a body temperature in the normal range will improve Outcome: Progressing   Problem: Respiratory: Goal: Ability to maintain adequate ventilation will improve Outcome: Progressing Goal: Ability to maintain a clear airway will improve Outcome: Progressing   Problem: Health Behavior/Discharge Planning: Goal: Ability to manage health-related needs will improve Outcome: Progressing

## 2021-09-05 NOTE — Consult Note (Signed)
[facility] Face-to-Face Psychiatry Consult   Reason for Consult:  AMS Referring Physician:  Amery Patient Identification: Brittany [name] MRN:  [id number] Principal Diagnosis: Acute encephalopathy Diagnosis:  Principal Problem:   Acute encephalopathy Active Problems:   Community acquired bilateral lower lobe pneumonia   Uncontrolled type 2 diabetes mellitus with hypoglycemia, with long-term current use of insulin (HCC)   Dyslipidemia   Elevated LFTs   Anxiety and depression   Pancreatic mass   Total Time spent with patient: 30 minutes  Subjective:   Brittany [name] is a 61 y.o. female patient admitted with acute encephalopathy, likely secondary to hypoglycemia and pneumonia.  HPI:  Patient admitted to medicine after presenting to ED from nursing facility due to due to aggression.  Patient was found to be hypoglycemic at blood glucose in the 20s.  On evaluation, patient is observed walking with staff around the unit.  She immediately asked Probation officer when she can go home.  Writer introduced herself as being from a behavioral health team.  Patient asks where her breakfast is. Writer noted to patient that it is almost dinner time. We walked to patient's nurse, and nurse told patient what she had already eaten today. Nurse agreed to get patient a little snack.  Patient is alert and oriented x 3. She is pleasant and cooperative. She speaks in linear, coherent sentences. She reports that the people at the place she was at "[facility]" are mean to her and she doesn't like it there. Writer offered support. Patient denies suicidal or homicidal ideations, auditory or visual hallucinations. She admits that she has had depression before but "feels good" now. She is pleasant, polite, lucid, no unsafe behaviors noted at time of interview. Bedside RN reports that she gets a little impatient when she wants to eat.   Patient does not require inpatient psychiatric hospitalization. She has psychiatric meds ordered  prior to this consult. No changes made.     Past Psychiatric History: depression, anxiety. Denies prior psychiatric hospitalizations  Risk to Self:   Risk to Others:   Prior Inpatient Therapy:   Prior Outpatient Therapy:    Past Medical History:  Past Medical History:  Diagnosis Date   C. 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   PFTs on 06/15/15 with FEV1/FVC ratio of 64%, FEV1 83%, DLCO 47%   Diabetes mellitus without complication ([place])    Dyslipidemia    History of Clostridium difficile colitis 03/03/2021   Hyperosmolar hyperglycemic state (HHS) ([place]) 11/18/2020   Hypertension    Hypokalemia; hyperkalemia 07/12/2019   Lactic acidosis 11/19/2020   Lupus (HCC)    Pancreatic cancer (HCC)    RLS (restless legs syndrome)    Seizure disorder (HCC)    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[place]) 07/17/2019    Past Surgical History:  Procedure Laterality Date   COLECTOMY WITH COLOSTOMY CREATION/HARTMANN PROCEDURE N/A 03/10/2021   Procedure: COLECTOMY WITH COLOSTOMY CREATION/HARTMANN PROCEDURE;  Surgeon: Jules Husbands, MD;  Location: [facility] ORS;  Service: General;  Laterality: N/A;   PANCREATECTOMY     spleenectomy     Family History:  Family History  Problem Relation Age of Onset   Anxiety disorder Sister    Breast cancer Maternal Aunt    Family Psychiatric  History: none reported Social History:  Social History   Substance and Sexual Activity  Alcohol Use Yes  Social History   Substance and Sexual Activity  Drug Use Not Currently    Social History   Socioeconomic History   Marital status: Widowed    Spouse name: Not on file   Number of children: Not on file   Years of education: Not on file   Highest education level: Not on file  Occupational History   Not on file  Tobacco Use   Smoking status: Former    Types: Cigarettes    Passive  exposure: Past   Smokeless tobacco: Never  Substance and Sexual Activity   Alcohol use: Yes   Drug use: Not Currently   Sexual activity: Not on file  Other Topics Concern   Not on file  Social History Narrative   Not on file   Social Determinants of Health   Financial Resource Strain: Not on file  Food Insecurity: Not on file  Transportation Needs: Not on file  Physical Activity: Not on file  Stress: Not on file  Social Connections: Not on file   Additional Social History:    Allergies:   Allergies  Allergen Reactions   Cephalosporins Itching    TOLERATED ZOSYN (PIPERACILLIN) BEFORE   Hydromorphone Itching   Keflin [Cephalothin] Itching   Lactose Intolerance (Gi) Diarrhea    Labs:  Results for orders placed or performed during the hospital encounter of 09/04/21 (from the past 48 hour(s))  CBG monitoring, ED     Status: Abnormal   Collection Time: 09/04/21  1:06 PM  Result Value Ref Range   Glucose-Capillary 28 (LL) 70 - 99 mg/dL    Comment: Glucose reference range applies only to samples taken after fasting for at least 8 hours.  CBG monitoring, ED     Status: Abnormal   Collection Time: 09/04/21  1:20 PM  Result Value Ref Range   Glucose-Capillary 189 (H) 70 - 99 mg/dL    Comment: Glucose reference range applies only to samples taken after fasting for at least 8 hours.   Comment 1 Notify RN   Blood culture (routine x 2)     Status: None (Preliminary result)   Collection Time: 09/04/21  1:20 PM   Specimen: BLOOD  Result Value Ref Range   Specimen Description BLOOD BLOOD RIGHT FOREARM    Special Requests      BOTTLES DRAWN AEROBIC AND ANAEROBIC Blood Culture adequate volume   Culture      NO GROWTH < 24 HOURS Performed at [facility], [address] [place], [place] [address]    Report Status PENDING   Lactic acid, plasma     Status: None   Collection Time: 09/04/21  1:20 PM  Result Value Ref Range   Lactic Acid, Venous 1.4 0.5 - 1.9 mmol/L     Comment: Performed at [facility], [address] [place], [place] [address]  CBC with Differential     Status: Abnormal   Collection Time: 09/04/21  1:25 PM  Result Value Ref Range   WBC 8.9 4.0 - 10.5 K/uL   RBC 3.76 (L) 3.87 - 5.11 MIL/uL   Hemoglobin 10.7 (L) 12.0 - 15.0 g/dL   HCT 35.1 (L) 36.0 - 46.0 %   MCV 93.4 80.0 - 100.0 fL   MCH 28.5 26.0 - 34.0 pg   MCHC 30.5 30.0 - 36.0 g/dL   RDW 19.9 (H) 11.5 - 15.5 %   Platelets 582 (H) 150 - 400 K/uL   nRBC 0.2 0.0 - 0.2 %   Neutrophils Relative %  36 %   Neutro Abs 3.2 1.7 - 7.7 K/uL   Lymphocytes Relative 52 %   Lymphs Abs 4.8 (H) 0.7 - 4.0 K/uL   Monocytes Relative 9 %   Monocytes Absolute 0.8 0.1 - 1.0 K/uL   Eosinophils Relative 1 %   Eosinophils Absolute 0.1 0.0 - 0.5 K/uL   Basophils Relative 1 %   Basophils Absolute 0.1 0.0 - 0.1 K/uL   Immature Granulocytes 1 %   Abs Immature Granulocytes 0.07 0.00 - 0.07 K/uL    Comment: Performed at [facility], [address] [name], [place] [address]  Comprehensive metabolic panel     Status: Abnormal   Collection Time: 09/04/21  1:25 PM  Result Value Ref Range   Sodium 143 135 - 145 mmol/L   Potassium 4.9 3.5 - 5.1 mmol/L   Chloride 119 (H) 98 - 111 mmol/L   CO2 16 (L) 22 - 32 mmol/L   Glucose, Bld 58 (L) 70 - 99 mg/dL    Comment: Glucose reference range applies only to samples taken after fasting for at least 8 hours.   BUN 20 8 - 23 mg/dL   Creatinine, Ser 0.90 0.44 - 1.00 mg/dL   Calcium 9.8 8.9 - 10.3 mg/dL   Total Protein 8.4 (H) 6.5 - 8.1 g/dL   Albumin 3.5 3.5 - 5.0 g/dL   AST 80 (H) 15 - 41 U/L   ALT 113 (H) 0 - 44 U/L   Alkaline Phosphatase 168 (H) 38 - 126 U/L   Total Bilirubin 0.4 0.3 - 1.2 mg/dL   GFR, Estimated >60 >60 mL/min    Comment: (NOTE) Calculated using the CKD-EPI Creatinine Equation (2021)    Anion gap 8 5 - 15    Comment: Performed at [facility], [place], [place] [address]  Troponin I (High  Sensitivity)     Status: None   Collection Time: 09/04/21  1:25 PM  Result Value Ref Range   Troponin I (High Sensitivity) 9 <18 ng/L    Comment: (NOTE) Elevated high sensitivity troponin I (hsTnI) values and significant  changes across serial measurements may suggest ACS but many other  chronic and acute conditions are known to elevate hsTnI results.  Refer to the "Links" section for chest pain algorithms and additional  guidance. Performed at [facility], [address] [place], [place] [address]   CBG monitoring, ED     Status: Abnormal   Collection Time: 09/04/21  2:02 PM  Result Value Ref Range   Glucose-Capillary 115 (H) 70 - 99 mg/dL    Comment: Glucose reference range applies only to samples taken after fasting for at least 8 hours.  POC CBG, ED     Status: Abnormal   Collection Time: 09/04/21  2:17 PM  Result Value Ref Range   Glucose-Capillary 129 (H) 70 - 99 mg/dL    Comment: Glucose reference range applies only to samples taken after fasting for at least 8 hours.   Comment 1 Notify RN   CBG monitoring, ED     Status: Abnormal   Collection Time: 09/04/21  2:55 PM  Result Value Ref Range   Glucose-Capillary 246 (H) 70 - 99 mg/dL    Comment: Glucose reference range applies only to samples taken after fasting for at least 8 hours.  CBG monitoring, ED     Status: Abnormal   Collection Time: 09/04/21  4:25 PM  Result Value Ref Range   Glucose-Capillary 354 (H) 70 -  99 mg/dL    Comment: Glucose reference range applies only to samples taken after fasting for at least 8 hours.   Comment 1 Notify RN    Comment 2 Document in Chart   CBG monitoring, ED     Status: Abnormal   Collection Time: 09/04/21  5:29 PM  Result Value Ref Range   Glucose-Capillary 326 (H) 70 - 99 mg/dL    Comment: Glucose reference range applies only to samples taken after fasting for at least 8 hours.   Comment 1 Notify RN   Blood culture (routine x 2)     Status: None (Preliminary result)    Collection Time: 09/04/21  8:36 PM   Specimen: BLOOD LEFT HAND  Result Value Ref Range   Specimen Description BLOOD LEFT HAND    Special Requests      BOTTLES DRAWN AEROBIC ONLY Blood Culture adequate volume   Culture      NO GROWTH < 12 HOURS Performed at [facility], [address], [place], [place] [address]    Report Status PENDING   Lactic acid, plasma     Status: Abnormal   Collection Time: 09/04/21  8:36 PM  Result Value Ref Range   Lactic Acid, Venous 2.5 (HH) 0.5 - 1.9 mmol/L    Comment: CRITICAL RESULT CALLED TO, READ BACK BY AND VERIFIED WITH MARCELA TURNER 2122 09/04/21 MU Performed at [facility], [address] [place], [place] [address]   Glucose, capillary     Status: Abnormal   Collection Time: 09/04/21  9:15 PM  Result Value Ref Range   Glucose-Capillary 350 (H) 70 - 99 mg/dL    Comment: Glucose reference range applies only to samples taken after fasting for at least 8 hours.   Comment 1 Notify RN   Basic metabolic panel     Status: Abnormal   Collection Time: 09/05/21  5:25 AM  Result Value Ref Range   Sodium 137 135 - 145 mmol/L   Potassium 5.1 3.5 - 5.1 mmol/L   Chloride 115 (H) 98 - 111 mmol/L   CO2 18 (L) 22 - 32 mmol/L   Glucose, Bld 325 (H) 70 - 99 mg/dL    Comment: Glucose reference range applies only to samples taken after fasting for at least 8 hours.   BUN 16 8 - 23 mg/dL   Creatinine, Ser 0.80 0.44 - 1.00 mg/dL   Calcium 9.1 8.9 - 10.3 mg/dL   GFR, Estimated >60 >60 mL/min    Comment: (NOTE) Calculated using the CKD-EPI Creatinine Equation (2021)    Anion gap 4 (L) 5 - 15    Comment: Performed at [facility], [address] [place], [place] [address]  CBC     Status: Abnormal   Collection Time: 09/05/21  5:25 AM  Result Value Ref Range   WBC 7.5 4.0 - 10.5 K/uL   RBC 3.42 (L) 3.87 - 5.11 MIL/uL   Hemoglobin 10.1 (L) 12.0 - 15.0 g/dL   HCT 31.3 (L) 36.0 - 46.0 %   MCV 91.5 80.0 - 100.0 fL   MCH 29.5  26.0 - 34.0 pg   MCHC 32.3 30.0 - 36.0 g/dL   RDW 20.0 (H) 11.5 - 15.5 %   Platelets 522 (H) 150 - 400 K/uL   nRBC 0.3 (H) 0.0 - 0.2 %    Comment: Performed at [facility], [address] [place], [place] [address]  Glucose, capillary     Status: Abnormal   Collection  Time: 09/05/21  8:53 AM  Result Value Ref Range   Glucose-Capillary 206 (H) 70 - 99 mg/dL    Comment: Glucose reference range applies only to samples taken after fasting for at least 8 hours.  Glucose, capillary     Status: Abnormal   Collection Time: 09/05/21 11:49 AM  Result Value Ref Range   Glucose-Capillary 193 (H) 70 - 99 mg/dL    Comment: Glucose reference range applies only to samples taken after fasting for at least 8 hours.    Current Facility-Administered Medications  Medication Dose Route Frequency Provider Last Rate Last Admin   0.9 %  sodium chloride infusion   Intravenous Continuous [place], Arvella Merles, MD   Stopped at 09/05/21 0912   acetaminophen (TYLENOL) tablet 650 mg  650 mg Oral Q6H PRN Mansy, Jan A, MD       Or   acetaminophen (TYLENOL) suppository 650 mg  650 mg Rectal Q6H PRN Mansy, Jan A, MD       aspirin EC tablet 81 mg  81 mg Oral Daily Mansy, Jan A, MD   81 mg at 09/05/21 0908   azithromycin (ZITHROMAX) 500 mg in sodium chloride 0.9 % 250 mL IVPB  500 mg Intravenous Q24H Wynelle Cleveland, [place] at 09/04/21 2221   cefTRIAXone (ROCEPHIN) 1 g in sodium chloride 0.9 % 100 mL IVPB  1 g Intravenous Q24H Wynelle Cleveland, [place] at 09/05/21 0555   enoxaparin (LOVENOX) injection 30 mg  30 mg Subcutaneous Q24H Vira Blanco, RPH   30 mg at 09/04/21 2110   famotidine (PEPCID) tablet 20 mg  20 mg Oral Daily Mansy, Jan A, MD   20 mg at 09/05/21 0908   guaiFENesin (MUCINEX) 12 hr tablet 600 mg  600 mg Oral BID Mansy, Jan A, MD   600 mg at 09/05/21 3710   insulin aspart (novoLOG) injection 0-5 Units  0-5 Units Subcutaneous QHS Mansy, Jan A, MD   4 Units at 09/04/21 2144   insulin  aspart (novoLOG) injection 0-9 Units  0-9 Units Subcutaneous TID WC Mansy, Jan A, MD   2 Units at 09/05/21 1216   magnesium hydroxide (MILK OF MAGNESIA) suspension 30 mL  30 mL Oral Daily PRN Mansy, Jan A, MD       mirtazapine (REMERON) tablet 15 mg  15 mg Oral QHS Mansy, Jan A, MD   15 mg at 09/04/21 2109   multivitamin with minerals tablet 1 tablet  1 tablet Oral Daily Mansy, Jan A, MD   1 tablet at 09/05/21 0908   OLANZapine (ZYPREXA) tablet 7.5 mg  7.5 mg Oral QHS Mansy, Jan A, MD   7.5 mg at 09/04/21 2144   ondansetron (ZOFRAN) tablet 4 mg  4 mg Oral Q6H PRN Mansy, Jan A, MD       Or   ondansetron [facility]) injection 4 mg  4 mg Intravenous Q6H PRN Mansy, Jan A, MD       Oral care mouth rinse  15 mL Mouth Rinse PRN Mansy, Jan A, MD       rOPINIRole (REQUIP) tablet 0.5 mg  0.5 mg Oral QHS Mansy, Jan A, MD   0.5 mg at 09/04/21 2109   traZODone (DESYREL) tablet 25 mg  25 mg Oral QHS PRN Mansy, Arvella Merles, MD        Musculoskeletal: Strength & Muscle Tone: within normal limits Gait & Station: normal Patient leans: N/A    Psychiatric Specialty Exam:  Presentation  General Appearance: Appropriate for Environment  Eye Contact:Good  Speech:Clear and Coherent  Speech Volume:Normal  Handedness:No data recorded  Mood and Affect  Mood:Euthymic (mildy anxious. Wants food)  Affect:Appropriate   Thought Process  Thought Processes:Coherent  Descriptions of Associations:Intact  Orientation:Full (Time, Place and Person)  Thought Content:Logical  History of Schizophrenia/Schizoaffective disorder:No data recorded Duration of Psychotic Symptoms:No data recorded Hallucinations:Hallucinations: None  Ideas of Reference:None  Suicidal Thoughts:Suicidal Thoughts: No  Homicidal Thoughts:Homicidal Thoughts: No   Sensorium  Memory:Immediate Fair  Judgment:Fair  Insight:Fair   Executive Functions  Concentration:Fair  Attention Span:Fair  [place]   Psychomotor Activity  Psychomotor Activity:Psychomotor Activity: Normal  Assets  Assets:Financial Resources/Insurance; Desire for Improvement; Resilience   Sleep  Sleep:Sleep: Good  Physical Exam: Physical Exam Vitals and nursing note reviewed.  HENT:     Head: Normocephalic.     Nose: No congestion or rhinorrhea.  Eyes:     General:        Right eye: No discharge.        Left eye: No discharge.  Cardiovascular:     Rate and Rhythm: Normal rate.  Pulmonary:     Effort: Pulmonary effort is normal.  Abdominal:     Comments: Ostomy bag in place   Musculoskeletal:        General: Normal range of motion.     Cervical back: Normal range of motion.  Skin:    General: Skin is dry.  Neurological:     Mental Status: She is alert and oriented to person, place, and time.    Review of Systems  Respiratory: Negative.    Gastrointestinal: Negative.        Denies problems. Has ostomy bag in place   Musculoskeletal: Negative.   Neurological: Negative.   Psychiatric/Behavioral:  Positive for depression (stable). Negative for hallucinations, memory loss, substance abuse and suicidal ideas. The patient is not nervous/anxious and does not have insomnia.    Blood pressure (!) 130/98, pulse 93, temperature 97.7 F (36.5 C), resp. rate 18, height 5' 2.99" (1.6 m), weight 48 kg, SpO2 100 %. Body mass index is 18.75 kg/m.  Treatment Plan Summary: Plan Patient already has psychiatric meds ordered: Zyprexa 7.5 mg at bedtime; trazadone 25 mg at bedtime; mirtazapine 15 mg at bedtime. Reviewed with Dr. Kurtis Bushman via secure chat.   Disposition: No evidence of imminent risk to self or others at present.   Patient does not meet criteria for psychiatric inpatient admission.  Sherlon Handing, NP 09/05/2021 3:33 PM

## 2021-09-06 DIAGNOSIS — G9340 Encephalopathy, unspecified: Secondary | ICD-10-CM | POA: Diagnosis not present

## 2021-09-06 LAB — COMPREHENSIVE METABOLIC PANEL
ALT: 79 U/L — ABNORMAL HIGH (ref 0–44)
AST: 41 U/L (ref 15–41)
Albumin: 3 g/dL — ABNORMAL LOW (ref 3.5–5.0)
Alkaline Phosphatase: 122 U/L (ref 38–126)
Anion gap: 6 (ref 5–15)
BUN: 14 mg/dL (ref 8–23)
CO2: 20 mmol/L — ABNORMAL LOW (ref 22–32)
Calcium: 9 mg/dL (ref 8.9–10.3)
Chloride: 111 mmol/L (ref 98–111)
Creatinine, Ser: 0.67 mg/dL (ref 0.44–1.00)
GFR, Estimated: >60 mL/min (ref >60)
Glucose, Bld: 233 mg/dL — ABNORMAL HIGH (ref 70–99)
Potassium: 5.5 mmol/L — ABNORMAL HIGH (ref 3.5–5.1)
Sodium: 137 mmol/L (ref 135–145)
Total Bilirubin: 0.5 mg/dL (ref 0.3–1.2)
Total Protein: 7.7 g/dL (ref 6.5–8.1)

## 2021-09-06 LAB — GLUCOSE, CAPILLARY
Glucose-Capillary: 239 mg/dL — ABNORMAL HIGH (ref 70–99)
Glucose-Capillary: 154 mg/dL — ABNORMAL HIGH (ref 70–99)
Glucose-Capillary: 400 mg/dL — ABNORMAL HIGH (ref 70–99)
Glucose-Capillary: 124 mg/dL — ABNORMAL HIGH (ref 70–99)
Glucose-Capillary: 217 mg/dL — ABNORMAL HIGH (ref 70–99)

## 2021-09-06 LAB — HEMOGLOBIN A1C
Hgb A1c MFr Bld: 15 % — ABNORMAL HIGH (ref 4.8–5.6)
Mean Plasma Glucose: 384 mg/dL

## 2021-09-06 LAB — LACTIC ACID, PLASMA: Lactic Acid, Venous: 1.2 mmol/L (ref 0.5–1.9)

## 2021-09-06 MED ORDER — ENOXAPARIN SODIUM 40 MG/0.4ML IJ SOSY
40.0000 mg | PREFILLED_SYRINGE | INTRAMUSCULAR | Status: DC
  Administered 2021-09-06 – 2021-09-11 (×6): 40 mg via SUBCUTANEOUS
  Filled 2021-09-06 (×6): qty 0.4

## 2021-09-06 MED ORDER — INSULIN GLARGINE-YFGN 100 UNIT/ML ~~LOC~~ SOLN
8.0000 [IU] | Freq: Every day | SUBCUTANEOUS | Status: DC
  Administered 2021-09-06 – 2021-09-12 (×6): 8 [IU] via SUBCUTANEOUS
  Filled 2021-09-06 (×7): qty 0.08

## 2021-09-06 MED ORDER — INSULIN GLARGINE-YFGN 100 UNIT/ML ~~LOC~~ SOLN
5.0000 [IU] | Freq: Every day | SUBCUTANEOUS | Status: DC
  Filled 2021-09-06: qty 0.05

## 2021-09-06 MED ORDER — SODIUM ZIRCONIUM CYCLOSILICATE 10 G PO PACK
10.0000 g | PACK | Freq: Once | ORAL | Status: AC
  Administered 2021-09-06: 10 g via ORAL
  Filled 2021-09-06: qty 1

## 2021-09-06 NOTE — Progress Notes (Signed)
End of shift note:  Pt's BG was 400 this afternoon. MD was notified and changes to her glucose regimen was done. IV antibiotics were given. 1:1 sitter remain with pt.  VSS.

## 2021-09-06 NOTE — Progress Notes (Signed)
PHARMACIST - PHYSICIAN COMMUNICATION  CONCERNING:  Enoxaparin (Lovenox) for DVT Prophylaxis    RECOMMENDATION: Patient was prescribed enoxaprin '30mg'$  q24 hours for VTE prophylaxis.   Filed Weights   09/04/21 2100  Weight: 48 kg (105 lb 13.1 oz)    Body mass index is 18.75 kg/m.  Estimated Creatinine Clearance: 56 mL/min (by C-G formula based on SCr of 0.67 mg/dL).   Patient is candidate for enoxaparin '40mg'$  every 24 hours based on CrCl >5m/min or Weight >45kg  DESCRIPTION: [facility]  Patient is now receiving enoxaparin 40 mg every 24 hours    MNoralee Space PharmD Clinical Pharmacist  09/06/2021 8:19 AM

## 2021-09-06 NOTE — Evaluation (Signed)
Physical Therapy Evaluation Patient Details Name: Brittany [name] MRN: [id number] DOB: 07/21/1960 Today's Date: 09/06/2021  History of Present Illness  Brittany [name] is a 49yoF who comes to [facility] from [facility] under IVC after noted erratice behavior in facility, here on 09/04/21. Pt admitted with acute encephalopathy 2/2 CAP. 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  Clinical Impression  Pt admitted with above diagnosis. Pt currently with functional limitations due to the deficits listed below (see "PT Problem List"). Upon entry, pt standing in room asking safety sitter about going outside for some 'fresh air.' Pt agreeable to participate. The pt is alert, pleasant, interactive, and able to provide info regarding prior level of function, both in tolerance and independence. Pt AMB fully around 2C, then down to 2A, then back, still asking to going outside. She reports to feel pretty good, back to baseline, ready to go home. Patient is at baseline, all education completed, and time is given to address all questions/concerns. No additional skilled PT services needed at this time, PT signing off. PT recommends daily ambulation ad lib or with nursing staff as needed to prevent deconditioning.    No data found.       Recommendations for follow up therapy are one component of a multi-disciplinary discharge planning process, led by the attending physician.  Recommendations may be updated based on patient status, additional functional criteria and insurance authorization.  Follow Up Recommendations Outpatient PT Can patient physically be transported by private vehicle: Yes    Assistance Recommended at Discharge Set up Supervision/Assistance  Patient can return home with the following  A little help with walking  and/or transfers;Direct supervision/assist for medications management;Assistance with cooking/housework;Assist for transportation;Direct supervision/assist for financial management    Equipment Recommendations None recommended by PT  Recommendations for Other Services       Functional Status Assessment Patient has not had a recent decline in their functional status     Precautions / Restrictions Precautions Precautions: None Restrictions Weight Bearing Restrictions: No      Mobility  Bed Mobility                    Transfers                   General transfer comment: pt standing up in room upon entry    Ambulation/Gait Ambulation/Gait assistance: Supervision, Min guard Gait Distance (Feet): 520 Feet Assistive device: None, 1 person hand held assist Gait Pattern/deviations: WFL(Within Functional Limits) Gait velocity: 0.75ms     General Gait Details: mild unsteadiness  Stairs            Wheelchair Mobility    Modified Rankin (Stroke Patients Only)       Balance                                             Pertinent Vitals/Pain Pain Assessment Pain Assessment: No/denies pain    Home Living Family/patient expects to be discharged to:: Private residence Living Arrangements: Children (DTR and 2 grands) Available Help at Discharge: Family;Available 24 hours/day Type of Home: House Home Access: Stairs to enter Entrance Stairs-Rails: RPsychiatric nurseof Steps: 2   Home Layout: One level Home Equipment: Cane - single point;Shower  seat;Grab bars - tub/shower;Hand held shower head;Rolling Walker (2 wheels)      Prior Function Prior Level of Function : Independent/Modified Independent             Mobility Comments: SPC or RW or nothing, just kinda depends ADLs Comments: reports indep     Hand Dominance        Extremity/Trunk Assessment                Communication      Cognition  Arousal/Alertness: Awake/alert Behavior During Therapy: WFL for tasks assessed/performed Overall Cognitive Status: Within Functional Limits for tasks assessed                                          General Comments      Exercises     Assessment/Plan    PT Assessment All further PT needs can be met in the next venue of care;Patient does not need any further PT services  PT Problem List Decreased balance       PT Treatment Interventions      PT Goals (Current goals can be found in the Care Plan section)  Acute Rehab PT Goals PT Goal Formulation: All assessment and education complete, DC therapy    Frequency       Co-evaluation               AM-PAC PT "6 Clicks" Mobility  Outcome Measure Help needed turning from your back to your side while in a flat bed without using bedrails?: None Help needed moving from lying on your back to sitting on the side of a flat bed without using bedrails?: None Help needed moving to and from a bed to a chair (including a wheelchair)?: None Help needed standing up from a chair using your arms (e.g., wheelchair or bedside chair)?: None Help needed to walk in hospital room?: A Little Help needed climbing 3-5 steps with a railing? : A Little 6 Click Score: 22    End of Session Equipment Utilized During Treatment: Gait belt Activity Tolerance: Patient tolerated treatment well;No increased pain Patient left: Other (comment);with nursing/sitter in room (at bedside, standing, next to sitter) Nurse Communication: Mobility status PT Visit Diagnosis: Muscle weakness (generalized) (M62.81);Difficulty in walking, not elsewhere classified (R26.2)    Time: 4196-2229 PT Time Calculation (min) (ACUTE ONLY): 15 min   Charges:   PT Evaluation $PT Eval Low Complexity: 1 Low     4:11 PM, 09/06/21 Etta Grandchild, PT, DPT Physical Therapist - [facility]  [phone] (Blue)     Orestes C 09/06/2021, 4:08 PM

## 2021-09-06 NOTE — TOC CM/SW Note (Signed)
[facility] will not take patient back at discharge. Sent secure chat to MD asking her to consult PT and OT.  Dayton Scrape, [place]

## 2021-09-06 NOTE — Progress Notes (Signed)
Provider Notification: Amery MD  Pt's BG is 400 this afternoon. Would you like [place] to give extra coverage?  Recommendations: To give coverage and changes to long acting insuline were made.

## 2021-09-06 NOTE — Inpatient Diabetes Management (Signed)
Inpatient Diabetes Program Recommendations  AACE/ADA: New Consensus Statement on Inpatient Glycemic Control (2015)  Target Ranges:  Prepandial:   less than 140 mg/dL      [facility]ak postprandial:   less than 180 mg/dL (1-2 hours)      Critically ill patients:  140 - 180 mg/dL   Lab Results  Component Value Date   GLUCAP 239 (H) 09/06/2021   HGBA1C 15.0 (H) 09/04/2021    Review of Glycemic Control  Latest Reference Range & Units 09/05/21 08:53 09/05/21 11:49 09/05/21 16:58 09/05/21 21:05 09/06/21 08:08  Glucose-Capillary 70 - 99 mg/dL 206 (H) 193 (H) 329 (H) 128 (H) 239 (H)  (H): Data is abnormally high  Diabetes history: DM2 Outpatient Diabetes medications: Lantus 8 units QD, Humalog SSI Current orders for Inpatient glycemic control: Novolog 0-9 units TID and 0-5 QHS   History of pancreatectomy    Inpatient Diabetes Program Recommendations:     Semglee 5 units QD (48 kg x 0.1 units)   Will continue to follow while inpatient.   Thank you, Reche Dixon, MSN, [place] Diabetes Coordinator Inpatient Diabetes Program [phone] (team pager from 8a-5p)
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
## 2021-09-07 DIAGNOSIS — G9340 Encephalopathy, unspecified: Secondary | ICD-10-CM | POA: Diagnosis not present

## 2021-09-07 LAB — URINALYSIS, ROUTINE W REFLEX MICROSCOPIC
Bacteria, UA: NONE SEEN
Bilirubin Urine: NEGATIVE
Glucose, UA: >=500 mg/dL — AB
Hgb urine dipstick: NEGATIVE
Ketones, ur: NEGATIVE mg/dL
Leukocytes,Ua: NEGATIVE
Nitrite: NEGATIVE
Protein, ur: NEGATIVE mg/dL
Specific Gravity, Urine: 1.017 (ref 1.005–1.030)
pH: 5 (ref 5.0–8.0)

## 2021-09-07 LAB — GLUCOSE, CAPILLARY
Glucose-Capillary: 438 mg/dL — ABNORMAL HIGH (ref 70–99)
Glucose-Capillary: 423 mg/dL — ABNORMAL HIGH (ref 70–99)
Glucose-Capillary: 342 mg/dL — ABNORMAL HIGH (ref 70–99)
Glucose-Capillary: 297 mg/dL — ABNORMAL HIGH (ref 70–99)

## 2021-09-07 LAB — POTASSIUM: Potassium: 5.8 mmol/L — ABNORMAL HIGH (ref 3.5–5.1)

## 2021-09-07 LAB — STREP PNEUMONIAE URINARY ANTIGEN: Strep Pneumo Urinary Antigen: NEGATIVE

## 2021-09-07 MED ORDER — SODIUM POLYSTYRENE SULFONATE 15 GM/60ML PO SUSP
30.0000 g | Freq: Once | ORAL | Status: AC
  Administered 2021-09-07: 30 g via ORAL
  Filled 2021-09-07: qty 120

## 2021-09-07 MED ORDER — INSULIN ASPART 100 UNIT/ML IJ SOLN
2.0000 [IU] | Freq: Three times a day (TID) | INTRAMUSCULAR | Status: DC
Start: 2021-09-07 — End: 2021-09-08
  Administered 2021-09-07 (×2): 2 [IU] via SUBCUTANEOUS
  Filled 2021-09-07 (×2): qty 1
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
## 2021-09-07 NOTE — Evaluation (Signed)
Occupational Therapy Evaluation Patient Details Name: Brittany [name] MRN: [id number] DOB: [birth year] Today's Date: 09/07/2021   History of Present Illness Brittany [name] is a 39yoF who comes to [facility] from [facility] under IVC after noted erratice behavior in facility, here on 09/04/21. Pt admitted with acute encephalopathy 2/2 CAP. 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   Clinical Impression   Brittany [name] presents with generalized weakness and limited endurance. She comes [facility] from [facility]. Prior to that she had been living in her single story home with her daughter and grandchildren. Here ability to perform ADL and IADL has had a variable course in recent months: sometimes she is able to complete all tasks [facility], on other days she displays confusion and requires a much higher level of assistance. She does need assistance for medication management and colostomy care. During today's evaluation, pt is A&Ox4, engages in bed mobility, transfers, ambulation w/o AD, toileting, grooming in standing, UB dressing, all with Mod I-SUPV for safety. Based on pt's performance today, no further OT sessions are necessary at present. Recommend DC home, with PRN supervision/home health.    Recommendations for follow up therapy are one component of a multi-disciplinary discharge planning process, led by the attending physician.  Recommendations may be updated based on patient status, additional functional criteria and insurance authorization.   Follow Up Recommendations  No OT follow up    Assistance Recommended at Discharge Intermittent Supervision/Assistance  Patient can return home with the following Assistance with cooking/housework;Assist for transportation;Direct supervision/assist for medications management;Direct  supervision/assist for financial management    Functional Status Assessment  Patient has not had a recent decline in their functional status  Equipment Recommendations  None recommended by OT    Recommendations for Other Services       Precautions / Restrictions Precautions Precautions: None Precaution Comments: colostomy Restrictions Weight Bearing Restrictions: No      Mobility Bed Mobility Overal bed mobility: Modified Independent                  Transfers Overall transfer level: Modified independent                 General transfer comment: Provided SUPV for safety, but pt had no difficulty -- performed all smoothly and safely      Balance Overall balance assessment: Needs assistance Sitting-balance support: No upper extremity supported Sitting balance-Leahy Scale: Normal Sitting balance - Comments: no balance deficits in sitting   Standing balance support: No upper extremity supported, During functional activity Standing balance-Leahy Scale: Good Standing balance comment: Good balance                           ADL either performed or assessed with clinical judgement   ADL Overall ADL's : Needs assistance/impaired Eating/Feeding: Independent   Grooming: Wash/dry hands;Wash/dry face;Oral care;Standing;Modified independent           Upper Body Dressing : Sitting;Modified independent       Toilet Transfer: Sales executive;Ambulation   Toileting- Clothing Manipulation and Hygiene: Modified independent         General ADL Comments: pt able to perform ADLs with Mod I, therapist provided SUPV for safety but pt completed all without difficulty     Vision         Perception  Praxis      Pertinent Vitals/Pain Pain Assessment Pain Assessment: No/denies pain     Hand Dominance Right   Extremity/Trunk Assessment Upper Extremity Assessment Upper Extremity Assessment: Overall WFL for tasks  assessed   Lower Extremity Assessment Lower Extremity Assessment: Overall WFL for tasks assessed   Cervical / Trunk Assessment Cervical / Trunk Assessment: Normal   Communication Communication Communication: No difficulties   Cognition Arousal/Alertness: Awake/alert Behavior During Therapy: WFL for tasks assessed/performed Overall Cognitive Status: Within Functional Limits for tasks assessed                                 General Comments: Pt is well oriented and appropriate throughout session today     General Comments       Exercises Other Exercises Other Exercises: Educ re: falls prevention; DC recs; medication, DM, colostomy mgmt   Shoulder Instructions      Home Living Family/patient expects to be discharged to:: Private residence Living Arrangements: Children Available Help at Discharge: Family;Available 24 hours/day Type of Home: House Home Access: Stairs to enter [employer] of Steps: 2 Entrance Stairs-Rails: Right;Left Home Layout: One level     Bathroom Shower/Tub: Occupational psychologist: Standard     Home Equipment: Cane - single point;Shower seat;Grab bars - tub/shower;Hand held Engineering geologist (2 wheels)          Prior Functioning/Environment Prior Level of Function : Needs assist             Mobility Comments: Has SPC and RW, normally ambulates without AD ADLs Comments: Able to manage dressing, toileting, grooming, showering INDly. Needs assistance with medication, diabetes management and colostomy care        OT Problem List: Decreased strength;Decreased cognition;Decreased safety awareness;Decreased activity tolerance      OT Treatment/Interventions:      OT Goals(Current goals can be found in the care plan section) Acute Rehab OT Goals Patient Stated Goal: to go home OT Goal Formulation: With patient Time For Goal Achievement: 09/21/21 Potential to Achieve Goals: Good  OT Frequency:       Co-evaluation              AM-PAC OT "6 Clicks" Daily Activity     Outcome Measure Help from another person eating meals?: None Help from another person taking care of personal grooming?: None Help from another person toileting, which includes using toliet, bedpan, or urinal?: None Help from another person bathing (including washing, rinsing, drying)?: A Little Help from another person to put on and taking off regular upper body clothing?: None Help from another person to put on and taking off regular lower body clothing?: None 6 Click Score: 23   End of Session    Activity Tolerance: Patient tolerated treatment well Patient left: in bed;with call bell/phone within reach;with nursing/sitter in room  OT Visit Diagnosis: Other abnormalities of gait and mobility (R26.89);Muscle weakness (generalized) (M62.81);Other symptoms and signs involving cognitive function                Time: 6834-1962 OT Time Calculation (min): 39 min Charges:  OT General Charges $OT Visit: 1 Visit OT Evaluation $OT Eval Moderate Complexity: 1 Mod OT Treatments $Self Care/Home Management : 38-52 mins Josiah Lobo, PhD, MS, OTR/L 09/07/21, 12:56 PM

## 2021-09-07 NOTE — Inpatient Diabetes Management (Signed)
Inpatient Diabetes Program Recommendations  AACE/ADA: New Consensus Statement on Inpatient Glycemic Control (2015)  Target Ranges:  Prepandial:   less than 140 mg/dL      Peak postprandial:   less than 180 mg/dL (1-2 hours)      Critically ill patients:  140 - 180 mg/dL   Lab Results  Component Value Date   GLUCAP 438 (H) 09/07/2021   HGBA1C 15.0 (H) 09/04/2021    Review of Glycemic Control  Latest Reference Range & Units 09/06/21 08:08 09/06/21 11:27 09/06/21 15:52 09/06/21 20:21 09/06/21 23:14 09/07/21 08:45  Glucose-Capillary 70 - 99 mg/dL 239 (H) 154 (H) 400 (H) 124 (H) 217 (H) 438 (H)  (H): Data is abnormally high  Diabetes history: DM2 Outpatient Diabetes medications: Lantus 8 units QD, Humalog SSI Current orders for Inpatient glycemic control: Semglee 8 units QG, Novolog 0-9 units TID and 0-5 QHS  Spoke Jun, RN.  Patient snacks throughout the day and overnight.  Spoke with patient at bedside.  She does not know what insulins she takes or doses.  She states her daughter administers her insulins.  Her daughter lives and stays home with her.     She states she checks her blood sugar regularly.  She states she stays hungry.  She has occasional lows and treats with food.    Might consider: Novolog 2-3 units TID with meals if consumes at least 50%   Will continue to follow while inpatient.  Thank you, Reche Dixon, MSN, [place] Diabetes Coordinator Inpatient Diabetes Program [phone] (team pager from 8a-5p)

## 2021-09-07 NOTE — Progress Notes (Signed)
Mobility Specialist - Progress Note    09/07/21 0900  Mobility  Activity Ambulated independently in hallway;Stood at bedside;Dangled on edge of bed  Level of Assistance Independent  Assistive Device None  Distance Ambulated (ft) 200 ft  Activity Response Tolerated well  $Mobility charge 1 Mobility    Pt ambulates indep in hallway w sitter present, voicing no complaints and returns to room with sitter present.  Merrily Brittle Mobility Specialist 09/07/21, 10:00 AM

## 2021-09-07 NOTE — Care Management Important Message (Signed)
Important Message  Patient Details  Name: Brittany [name] MRN: [id number] Date of Birth: Feb 17, 1960   Medicare Important Message Given:  Yes     [name] 09/07/2021, 11:21 AM
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
## 2021-09-07 NOTE — Progress Notes (Signed)
Blood sugar is 438. MD was made aware and ordered to give max of sliding scale, 9 units.

## 2021-09-08 DIAGNOSIS — G9340 Encephalopathy, unspecified: Secondary | ICD-10-CM | POA: Diagnosis not present

## 2021-09-08 LAB — COMPREHENSIVE METABOLIC PANEL
ALT: 60 U/L — ABNORMAL HIGH (ref 0–44)
AST: 37 U/L (ref 15–41)
Albumin: 3.1 g/dL — ABNORMAL LOW (ref 3.5–5.0)
Alkaline Phosphatase: 135 U/L — ABNORMAL HIGH (ref 38–126)
Anion gap: 4 — ABNORMAL LOW (ref 5–15)
BUN: 16 mg/dL (ref 8–23)
CO2: 22 mmol/L (ref 22–32)
Calcium: 9.2 mg/dL (ref 8.9–10.3)
Chloride: 112 mmol/L — ABNORMAL HIGH (ref 98–111)
Creatinine, Ser: 0.71 mg/dL (ref 0.44–1.00)
GFR, Estimated: >60 mL/min (ref >60)
Glucose, Bld: 456 mg/dL — ABNORMAL HIGH (ref 70–99)
Potassium: 5.6 mmol/L — ABNORMAL HIGH (ref 3.5–5.1)
Sodium: 138 mmol/L (ref 135–145)
Total Bilirubin: 0.3 mg/dL (ref 0.3–1.2)
Total Protein: 7.9 g/dL (ref 6.5–8.1)

## 2021-09-08 LAB — GLUCOSE, CAPILLARY
Glucose-Capillary: 121 mg/dL — ABNORMAL HIGH (ref 70–99)
Glucose-Capillary: 120 mg/dL — ABNORMAL HIGH (ref 70–99)
Glucose-Capillary: 22 mg/dL — CL (ref 70–99)
Glucose-Capillary: 244 mg/dL — ABNORMAL HIGH (ref 70–99)
Glucose-Capillary: 134 mg/dL — ABNORMAL HIGH (ref 70–99)
Glucose-Capillary: 170 mg/dL — ABNORMAL HIGH (ref 70–99)
Glucose-Capillary: 380 mg/dL — ABNORMAL HIGH (ref 70–99)

## 2021-09-08 LAB — LEGIONELLA PNEUMOPHILA SEROGP 1 UR AG: L. pneumophila Serogp 1 Ur Ag: NEGATIVE

## 2021-09-08 LAB — MAGNESIUM: Magnesium: 2 mg/dL (ref 1.7–2.4)

## 2021-09-08 LAB — AMMONIA: Ammonia: 23 umol/L (ref 9–35)

## 2021-09-08 MED ORDER — LORAZEPAM 2 MG/ML IJ SOLN: 0.5000 mg | Freq: Once | INTRAMUSCULAR | Status: DC

## 2021-09-08 MED ORDER — SODIUM POLYSTYRENE SULFONATE 15 GM/60ML PO SUSP
30.0000 g | Freq: Once | ORAL | Status: AC
  Administered 2021-09-08: 30 g via ORAL
  Filled 2021-09-08: qty 120

## 2021-09-08 MED ORDER — SODIUM CHLORIDE 0.9 % NICU IV INFUSION SIMPLE
250.0000 mL | INJECTION | Freq: Once | INTRAVENOUS | Status: AC
Start: 2021-09-08 — End: 2021-09-09
  Administered 2021-09-08: 250 mL via INTRAVENOUS

## 2021-09-08 MED ORDER — DEXTROSE 50 % IV SOLN
INTRAVENOUS | Status: AC
  Administered 2021-09-08: 50 mL
  Filled 2021-09-08: qty 50

## 2021-09-08 MED ORDER — BOOST / RESOURCE BREEZE PO LIQD CUSTOM
1.0000 | Freq: Three times a day (TID) | ORAL | Status: DC
  Administered 2021-09-08 – 2021-09-11 (×6): 1 via ORAL

## 2021-09-08 MED ORDER — INSULIN ASPART 100 UNIT/ML IJ SOLN
23.0000 [IU] | Freq: Once | INTRAMUSCULAR | Status: AC
  Administered 2021-09-08: 23 [IU] via SUBCUTANEOUS
  Filled 2021-09-08: qty 1

## 2021-09-08 MED ORDER — SODIUM CHLORIDE 0.9 % IV SOLN: INTRAVENOUS | Status: DC

## 2021-09-08 MED ORDER — HALOPERIDOL LACTATE 5 MG/ML IJ SOLN
2.0000 mg | Freq: Once | INTRAMUSCULAR | Status: AC
  Administered 2021-09-08: 2 mg via INTRAVENOUS
  Filled 2021-09-08: qty 1

## 2021-09-08 NOTE — Inpatient Diabetes Management (Signed)
Inpatient Diabetes Program Recommendations  AACE/ADA: New Consensus Statement on Inpatient Glycemic Control (2015)  Target Ranges:  Prepandial:   less than 140 mg/dL      Peak postprandial:   less than 180 mg/dL (1-2 hours)      Critically ill patients:  140 - 180 mg/dL   Lab Results  Component Value Date   GLUCAP 120 (H) 09/08/2021   HGBA1C 15.0 (H) 09/04/2021     Latest Reference Range & Units 09/08/21 05:27  Glucose 70 - 99 mg/dL 456 (H) Novolog 23 units  (H): Data is abnormally high   Latest Reference Range & Units 09/08/21 11:47  Glucose-Capillary 70 - 99 mg/dL 22 (LL)  (LL): Data is critically low  Patient received 23 units of Novolog for a serum glucose of 456 mg/dL at 05:27. Hypoglycemic at noon-22 mg/dL.  It is not recommended to administer insulin based off of a serum glucose.  Please use CBG at scheduled time 08:00 to administer correction.  Also please consider:  Novolog 0-6 units TID and 0-5 QHS  Novolog 2 units TID with meals if consumes at least 50%  Will continue to follow while inpatient.  Thank you, Reche Dixon, MSN, [place] Diabetes Coordinator Inpatient Diabetes Program [phone] (team pager from 8a-5p)

## 2021-09-08 NOTE — Progress Notes (Signed)
Patient pulled out IV and removed colostomy bag. Patient did allow colostomy bag to be replaced. Stated she needs some time in order to have IV catheter placed. Went to place catheter patient asleep.

## 2021-09-08 NOTE — TOC CM/SW Note (Addendum)
Spoke with Anijah with APS. She has left a vm for daughter, waiting on a return call to discuss updates on Medicaid and if she can care for patient at home. She is concerned it may be an unsafe discharge due to daughter stating she could not care for her in the past. If home becomes only option, they may have to file a protective order so they can work on placement.  Dayton Scrape, CSW [phone]  2:44 pm: Left voicemail for Anijah to see if daughter had called her back yet.  Dayton Scrape, [place]

## 2021-09-08 NOTE — Plan of Care (Signed)
  Problem: Clinical Measurements: Goal: Ability to maintain clinical measurements within normal limits will improve Outcome: Not Progressing   Problem: Nutrition: Goal: Adequate nutrition will be maintained Outcome: Not Progressing   Problem: Education: Goal: Ability to describe self-care measures that may prevent or decrease complications (Diabetes Survival Skills Education) will improve Outcome: Not Progressing   Problem: Metabolic: Goal: Ability to maintain appropriate glucose levels will improve Outcome: Not Progressing
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
## 2021-09-08 NOTE — Progress Notes (Addendum)
[employer] - Patient non stop wandering continuous. Agitated and combative with sitter.   Action - Dose of haldol ordered and ammonia level added to am labs  Response - Patient continued to wonder and nurse also reports now non stop eating throughout night and serum glucose 456 with K of 5.6  23 units of SQ insulin ordered for now

## 2021-09-08 NOTE — Progress Notes (Signed)
Patient found to have a CBG level of 22. Notified provider administered IV dextrose. Patient was also able to drink orange juice. Subsequent CBG 244, 120, 134. Patient ate lunch. Will continue to monitor.Charge Nurse Winslow aware.

## 2021-09-08 NOTE — Progress Notes (Signed)
Initial Nutrition Assessment  DOCUMENTATION CODES:   Severe malnutrition in context of chronic illness  INTERVENTION:   Boost Breeze po TID, each supplement provides 250 kcal and 9 grams of protein  MVI po daily   Pt at high refeed risk; recommend monitor potassium, magnesium and phosphorus labs daily until stable  NUTRITION DIAGNOSIS:   Severe Malnutrition related to chronic illness (COPD, pancreatic cancer) as evidenced by severe fat depletion, severe muscle depletion.  GOAL:   Patient will meet greater than or equal to 90% of their needs  MONITOR:   PO intake, Supplement acceptance, Labs, Weight trends, Skin, I & O's  REASON FOR ASSESSMENT:   Rounds    ASSESSMENT:   61 y.o. 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  Visited pt's room today. Pt is well known to this RD from numerous previous admissions. Pt is unable to provide any reliable nutrition related history. Pt with poor appetite and oral intake at baseline. Pt does not drink "milky" supplements as she is lactose intolerance. Pt does like Boost Breeze. Per chart, pt appears weight stable at baseline but is currently up ~7lbs from her UBW. Pt currently eating 100% of meals in hospital. RD will add supplements and MVI to help pt meet her estimated needs. Pt is at high refeed risk.   Medications reviewed and include: aspirin, lovenox, pepcid, insulin,  remeron, MVI, azithromycin, ceftriaxone   Labs reviewed: K 5.6(H), Mg 2.0 wnl Hgb 10.1(L), Hct 31.3(L) Cbgs- 134, 120, 244, 22, 121 x 24 hrs AIC 15.0(H)- 7/24  NUTRITION - FOCUSED PHYSICAL EXAM:  Flowsheet Row Most Recent Value  Orbital Region Moderate depletion  Upper Arm Region Severe depletion  Thoracic and Lumbar Region Severe depletion  Buccal Region Moderate depletion  Temple Region Severe depletion  Clavicle Bone Region Severe depletion  Clavicle and Acromion Bone Region Severe depletion  Scapular Bone Region Severe depletion  Dorsal Hand Severe depletion  Patellar Region Severe depletion  Anterior Thigh Region Severe depletion  Posterior Calf Region Severe depletion  Edema (RD Assessment) None  Hair Reviewed  Eyes Reviewed  Mouth Reviewed  Skin Reviewed  Nails Reviewed   Diet Order:   Diet Order             Diet Carb Modified Fluid consistency: Thin; Room service appropriate? Yes  Diet effective now                  EDUCATION NEEDS:   No education needs have been identified at this time  Skin:  Skin Assessment: Reviewed RN Assessment  Last BM:  7/28- 2727m via ostomy  Height:   Ht Readings from Last 1 Encounters:  09/04/21 5' 2.99" (1.6 m)    Weight:   Wt Readings from Last 1 Encounters:  09/04/21 48 kg    BMI:  Body mass index is 18.75 kg/m.  Estimated Nutritional Needs:   Kcal:  1400-1600kcal/day  Protein:  70-80g/day  Fluid:  1.3-1.5L/day  CKoleen DistanceMS, RD, LDN Please refer to [facility]  and/or RD on-call/weekend/after hours pager

## 2021-09-09 DIAGNOSIS — G9340 Encephalopathy, unspecified: Secondary | ICD-10-CM | POA: Diagnosis not present

## 2021-09-09 DIAGNOSIS — J189 Pneumonia, unspecified organism: Secondary | ICD-10-CM | POA: Diagnosis not present

## 2021-09-09 DIAGNOSIS — E11649 Type 2 diabetes mellitus with hypoglycemia without coma: Secondary | ICD-10-CM | POA: Diagnosis not present

## 2021-09-09 DIAGNOSIS — G9341 Metabolic encephalopathy: Secondary | ICD-10-CM | POA: Diagnosis not present

## 2021-09-09 LAB — BASIC METABOLIC PANEL
Anion gap: 4 — ABNORMAL LOW (ref 5–15)
BUN: 13 mg/dL (ref 8–23)
CO2: 22 mmol/L (ref 22–32)
Calcium: 8.9 mg/dL (ref 8.9–10.3)
Chloride: 113 mmol/L — ABNORMAL HIGH (ref 98–111)
Creatinine, Ser: 0.73 mg/dL (ref 0.44–1.00)
GFR, Estimated: >60 mL/min (ref >60)
Glucose, Bld: 118 mg/dL — ABNORMAL HIGH (ref 70–99)
Potassium: 4.6 mmol/L (ref 3.5–5.1)
Sodium: 139 mmol/L (ref 135–145)

## 2021-09-09 LAB — GLUCOSE, CAPILLARY
Glucose-Capillary: 83 mg/dL (ref 70–99)
Glucose-Capillary: 110 mg/dL — ABNORMAL HIGH (ref 70–99)
Glucose-Capillary: 409 mg/dL — ABNORMAL HIGH (ref 70–99)
Glucose-Capillary: 435 mg/dL — ABNORMAL HIGH (ref 70–99)
Glucose-Capillary: 134 mg/dL — ABNORMAL HIGH (ref 70–99)
Glucose-Capillary: 496 mg/dL — ABNORMAL HIGH (ref 70–99)

## 2021-09-09 LAB — GLUCOSE, RANDOM: Glucose, Bld: 507 mg/dL (ref 70–99)

## 2021-09-09 LAB — CULTURE, BLOOD (ROUTINE X 2)
Culture: NO GROWTH
Culture: NO GROWTH
Special Requests: ADEQUATE
Special Requests: ADEQUATE

## 2021-09-09 LAB — CBC
HCT: 29.7 % — ABNORMAL LOW (ref 36.0–46.0)
Hemoglobin: 9.6 g/dL — ABNORMAL LOW (ref 12.0–15.0)
MCH: 28.9 pg (ref 26.0–34.0)
MCHC: 32.3 g/dL (ref 30.0–36.0)
MCV: 89.5 fL (ref 80.0–100.0)
Platelets: 426 10*3/uL — ABNORMAL HIGH (ref 150–400)
RBC: 3.32 MIL/uL — ABNORMAL LOW (ref 3.87–5.11)
RDW: 19.6 % — ABNORMAL HIGH (ref 11.5–15.5)
WBC: 18 10*3/uL — ABNORMAL HIGH (ref 4.0–10.5)
nRBC: 0.4 % — ABNORMAL HIGH (ref 0.0–0.2)

## 2021-09-09 LAB — PHOSPHORUS: Phosphorus: 3.8 mg/dL (ref 2.5–4.6)

## 2021-09-09 MED ORDER — INSULIN ASPART 100 UNIT/ML IJ SOLN
20.0000 [IU] | Freq: Once | INTRAMUSCULAR | Status: AC
  Administered 2021-09-09: 20 [IU] via SUBCUTANEOUS
  Filled 2021-09-09: qty 1

## 2021-09-09 MED ORDER — INSULIN ASPART 100 UNIT/ML IJ SOLN
3.0000 [IU] | Freq: Three times a day (TID) | INTRAMUSCULAR | Status: DC
  Administered 2021-09-10 – 2021-09-11 (×3): 3 [IU] via SUBCUTANEOUS
  Filled 2021-09-09 (×3): qty 1

## 2021-09-09 NOTE — Assessment & Plan Note (Signed)
Intraductal papillary mucinous neoplasm of pancreas s/p distal pancreatectomy and splenectomy 2005  Per most recent [facility] discharge summary:"Pt with hx of partial pancreatectomy I/s/o IPMN. CTAP on admission found continued slight enlargement of pancreatic uncinate process complex cystic lesion, favored mixed branch IPMN.Radiology has recommended follow-upMRCP/MRI in outpatient setting(apparently completed, formal read not available to me at this point)." Continue home pancrealipase 1 capsule TIDAC  We will request Dr. Tasia Catchings to evaluate her here.  I am not sure if patient is following up with her oncologist at [place] with her family dynamics

## 2021-09-09 NOTE — Progress Notes (Addendum)
Patient colostomy bag "came off" again. New colostomy bag secured and placed. Pt educated regarding risk of skin breakdown with frequent removal. Pt verbalized understanding.
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
## 2021-09-09 NOTE — Assessment & Plan Note (Signed)
-    likely secondary to hypoglycemia and exacerbated by pneumonia. - Now resolved
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
## 2021-09-09 NOTE — Assessment & Plan Note (Signed)
Resolved

## 2021-09-09 NOTE — Progress Notes (Signed)
Pt colostomy bag came off; and per pt "I did not take it off; it was coming off." New colostomy bag placed.

## 2021-09-10 DIAGNOSIS — K8689 Other specified diseases of pancreas: Secondary | ICD-10-CM | POA: Diagnosis not present

## 2021-09-10 DIAGNOSIS — E11649 Type 2 diabetes mellitus with hypoglycemia without coma: Secondary | ICD-10-CM | POA: Diagnosis not present

## 2021-09-10 DIAGNOSIS — G9341 Metabolic encephalopathy: Secondary | ICD-10-CM | POA: Diagnosis not present

## 2021-09-10 DIAGNOSIS — G9340 Encephalopathy, unspecified: Secondary | ICD-10-CM | POA: Diagnosis not present

## 2021-09-10 DIAGNOSIS — J189 Pneumonia, unspecified organism: Secondary | ICD-10-CM | POA: Diagnosis not present

## 2021-09-10 LAB — CBC
HCT: 33.4 % — ABNORMAL LOW (ref 36.0–46.0)
Hemoglobin: 10.4 g/dL — ABNORMAL LOW (ref 12.0–15.0)
MCH: 29 pg (ref 26.0–34.0)
MCHC: 31.1 g/dL (ref 30.0–36.0)
MCV: 93 fL (ref 80.0–100.0)
Platelets: 438 10*3/uL — ABNORMAL HIGH (ref 150–400)
RBC: 3.59 MIL/uL — ABNORMAL LOW (ref 3.87–5.11)
RDW: 20.3 % — ABNORMAL HIGH (ref 11.5–15.5)
WBC: 12.5 10*3/uL — ABNORMAL HIGH (ref 4.0–10.5)
nRBC: 0.7 % — ABNORMAL HIGH (ref 0.0–0.2)

## 2021-09-10 LAB — BASIC METABOLIC PANEL
Anion gap: 7 (ref 5–15)
BUN: 13 mg/dL (ref 8–23)
CO2: 18 mmol/L — ABNORMAL LOW (ref 22–32)
Calcium: 8.9 mg/dL (ref 8.9–10.3)
Chloride: 111 mmol/L (ref 98–111)
Creatinine, Ser: 0.81 mg/dL (ref 0.44–1.00)
GFR, Estimated: >60 mL/min (ref >60)
Glucose, Bld: 366 mg/dL — ABNORMAL HIGH (ref 70–99)
Potassium: 4.6 mmol/L (ref 3.5–5.1)
Sodium: 136 mmol/L (ref 135–145)

## 2021-09-10 LAB — URINE CULTURE
Culture: 10000 — AB
Special Requests: NORMAL

## 2021-09-10 LAB — GLUCOSE, CAPILLARY
Glucose-Capillary: 184 mg/dL — ABNORMAL HIGH (ref 70–99)
Glucose-Capillary: 337 mg/dL — ABNORMAL HIGH (ref 70–99)
Glucose-Capillary: 455 mg/dL — ABNORMAL HIGH (ref 70–99)
Glucose-Capillary: 185 mg/dL — ABNORMAL HIGH (ref 70–99)
Glucose-Capillary: 178 mg/dL — ABNORMAL HIGH (ref 70–99)

## 2021-09-10 NOTE — Assessment & Plan Note (Signed)
-    hold off statin therapy. - Monitor LFTs.
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
## 2021-09-10 NOTE — Assessment & Plan Note (Signed)
-   Continue long-acting insulin and NovoLog 3 units 3 times daily with each meals - continue supplement coverage with NovoLog.

## 2021-09-10 NOTE — Consult Note (Signed)
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 clips are noted in the upper abdomen. No acute osseous abnormality is seen. IMPRESSION: 1. Improved lung aeration. Persistent streaky right greater than left basilar opacities suggesting atelectasis. A small nodular opacity in the right lung base is either new or was obscured by atelectasis and pleural fluid on the prior study and may potentially reflect a focal area of pneumonia. Follow-up radiographs are recommended to ensure resolution. 2. Persistent small right pleural effusion. Electronically Signed   By: Logan Bores M.D.   On: 06/25/2021 16:26   CT Cervical Spine Wo Contrast  Result Date: 06/25/2021 CLINICAL DATA:  Neck trauma EXAM: CT CERVICAL SPINE WITHOUT CONTRAST TECHNIQUE: Multidetector CT imaging of the cervical spine was performed without intravenous contrast. Multiplanar CT image reconstructions were also generated. RADIATION DOSE REDUCTION: This exam was performed according to the departmental dose-optimization program which includes automated exposure control, adjustment of the mA and/or kV according to patient size and/or use of iterative reconstruction technique. COMPARISON:  CT cervical spine 04/05/2021 FINDINGS: Alignment:  Reversal of the normal lordotic curvature similar to previous study centered at C5. Grade 1 anterolisthesis of C3 on C4 and C4 on C5, unchanged. No acute malalignment identified. Skull base and vertebrae: No acute fracture. No primary bone lesion or focal pathologic process. Soft tissues and spinal canal: No prevertebral fluid or swelling. No visible canal hematoma. Disc levels: Mild to moderate  intervertebral disc space narrowing at C5-C6 and C6-C7 with small dorsal endplate osteophytes and uncovertebral spurring. Mild facet arthropathy throughout the cervical spine. Upper chest: No acute process identified. Emphysematous changes of the lungs. Other: None. IMPRESSION: 1. No acute fracture or malalignment identified in the cervical spine. Stable chronic changes. 2. Emphysema. Electronically Signed   By: Ofilia Neas M.D.   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  Check CA 19-9. Patient has previously followed up with [facility] oncology and has participated in a clinical trial. IPMN size remains similar comparing to recent CT scans.  Size is enlarged comparing to CT study in 2021.  I think patient will benefit from EUS biopsy and potentially surgical evaluation.-No pancreatic surgery locally at [facility].. Of note from care everywhere, she has participating in a clinical trial at [facility] I think it is of patient's best interest to continue follow-up with [facility] Dr. Shon Hough.  I provided patient Dr. Ayesha Rumpf office number for her to call and reschedule follow-up appointments. Thank you for allowing me to participate in the care of this patient.   Earlie Server, MD, PhD Hematology Oncology 09/10/2021

## 2021-09-10 NOTE — Assessment & Plan Note (Signed)
-   Completed course of Rocephin and Zithromax

## 2021-09-10 NOTE — Assessment & Plan Note (Addendum)
Dr. Tasia Catchings will evaluate her while here as she has lost follow-up with outpatient oncology.  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 CTAP on admission found continued slight enlargement of pancreatic uncinate process complex cystic lesion, favored mixed branch IPMN.Radiology has recommended follow-upMRCP/MRI in outpatient setting(apparently completed, formal read not available to me at this point)." Continue home pancrealipase 1 capsule TIDAC

## 2021-09-10 NOTE — Assessment & Plan Note (Signed)
Resolved

## 2021-09-10 NOTE — Assessment & Plan Note (Signed)
-   likely secondary to hypoglycemia and exacerbated by pneumonia. - Now resolved.

## 2021-09-10 NOTE — Assessment & Plan Note (Signed)
-   continue her Zyprexa and Remeron. - Continue Remeron, trazodone, Zyprexa and Ativan per psychiatry.

## 2021-09-10 NOTE — Assessment & Plan Note (Signed)
-    hold off statin therapy given elevated transaminases

## 2021-09-11 DIAGNOSIS — G9341 Metabolic encephalopathy: Secondary | ICD-10-CM | POA: Diagnosis not present

## 2021-09-11 DIAGNOSIS — G9340 Encephalopathy, unspecified: Secondary | ICD-10-CM | POA: Diagnosis not present

## 2021-09-11 DIAGNOSIS — J189 Pneumonia, unspecified organism: Secondary | ICD-10-CM | POA: Diagnosis not present

## 2021-09-11 DIAGNOSIS — E11649 Type 2 diabetes mellitus with hypoglycemia without coma: Secondary | ICD-10-CM | POA: Diagnosis not present

## 2021-09-11 LAB — COMPREHENSIVE METABOLIC PANEL
ALT: 47 U/L — ABNORMAL HIGH (ref 0–44)
AST: 31 U/L (ref 15–41)
Albumin: 3.2 g/dL — ABNORMAL LOW (ref 3.5–5.0)
Alkaline Phosphatase: 137 U/L — ABNORMAL HIGH (ref 38–126)
Anion gap: 9 (ref 5–15)
BUN: 15 mg/dL (ref 8–23)
CO2: 19 mmol/L — ABNORMAL LOW (ref 22–32)
Calcium: 9.4 mg/dL (ref 8.9–10.3)
Chloride: 112 mmol/L — ABNORMAL HIGH (ref 98–111)
Creatinine, Ser: 0.74 mg/dL (ref 0.44–1.00)
GFR, Estimated: >60 mL/min (ref >60)
Glucose, Bld: 121 mg/dL — ABNORMAL HIGH (ref 70–99)
Potassium: 5 mmol/L (ref 3.5–5.1)
Sodium: 140 mmol/L (ref 135–145)
Total Bilirubin: 0.5 mg/dL (ref 0.3–1.2)
Total Protein: 7.7 g/dL (ref 6.5–8.1)

## 2021-09-11 LAB — CBC
HCT: 33.7 % — ABNORMAL LOW (ref 36.0–46.0)
Hemoglobin: 10.6 g/dL — ABNORMAL LOW (ref 12.0–15.0)
MCH: 28.6 pg (ref 26.0–34.0)
MCHC: 31.5 g/dL (ref 30.0–36.0)
MCV: 90.8 fL (ref 80.0–100.0)
Platelets: 440 10*3/uL — ABNORMAL HIGH (ref 150–400)
RBC: 3.71 MIL/uL — ABNORMAL LOW (ref 3.87–5.11)
RDW: 19.5 % — ABNORMAL HIGH (ref 11.5–15.5)
WBC: 9.1 10*3/uL (ref 4.0–10.5)
nRBC: 2 % — ABNORMAL HIGH (ref 0.0–0.2)

## 2021-09-11 LAB — GLUCOSE, CAPILLARY
Glucose-Capillary: 434 mg/dL — ABNORMAL HIGH (ref 70–99)
Glucose-Capillary: 394 mg/dL — ABNORMAL HIGH (ref 70–99)
Glucose-Capillary: 291 mg/dL — ABNORMAL HIGH (ref 70–99)
Glucose-Capillary: 85 mg/dL (ref 70–99)
Glucose-Capillary: 107 mg/dL — ABNORMAL HIGH (ref 70–99)

## 2021-09-11 MED ORDER — INSULIN ASPART 100 UNIT/ML IJ SOLN
0.0000 [IU] | INTRAMUSCULAR | Status: DC
  Administered 2021-09-11: 5 [IU] via SUBCUTANEOUS
  Administered 2021-09-12: 9 [IU] via SUBCUTANEOUS
  Administered 2021-09-12: 2 [IU] via SUBCUTANEOUS
  Filled 2021-09-11 (×6): qty 1

## 2021-09-11 MED ORDER — INSULIN ASPART 100 UNIT/ML IJ SOLN
4.0000 [IU] | Freq: Three times a day (TID) | INTRAMUSCULAR | Status: DC
  Administered 2021-09-11 – 2021-09-12 (×4): 4 [IU] via SUBCUTANEOUS
  Filled 2021-09-11 (×3): qty 1
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
## 2021-09-11 NOTE — Assessment & Plan Note (Signed)
Resolved

## 2021-09-11 NOTE — Assessment & Plan Note (Signed)
-   Completed course of Rocephin and Zithromax.

## 2021-09-11 NOTE — Inpatient Diabetes Management (Addendum)
Inpatient Diabetes Program Recommendations  AACE/ADA: New Consensus Statement on Inpatient Glycemic Control (2015)  Target Ranges:  Prepandial:   less than 140 mg/dL      Peak postprandial:   less than 180 mg/dL (1-2 hours)      Critically ill patients:  140 - 180 mg/dL    Latest Reference Range & Units 09/09/21 07:48 09/09/21 11:32 09/09/21 11:36 09/09/21 16:50 09/09/21 21:00  Glucose-Capillary 70 - 99 mg/dL 110 (H) 409 (H) 435 (H)  15 units Novolog  8 units Semglee  134 (H)  1 unit Novolog  496 (H)  5 units Novolog '@2134'$   20 units Novolog '@2307'$  (Lab Glu at 2134 was 507)  (H): Data is abnormally high  Latest Reference Range & Units 09/10/21 08:24 09/10/21 10:58 09/10/21 16:34 09/10/21 20:51  Glucose-Capillary 70 - 99 mg/dL 337 (H)  Novolog NOT given 455 (H)  12 units Novolog  8 units Semglee 185 (H)  5 units Novolog  178 (H)  (H): Data is abnormally high  Latest Reference Range & Units 09/11/21 08:20  Glucose-Capillary 70 - 99 mg/dL 434 (H)  12 units Novolog  8 units Semglee  (H): Data is abnormally high    History: Diabetes, CVA, Pancreactomy/splenectomy  Home DM Meds: Humalog 5 units TID with meals                             Lantus 8 units Daily   Current Orders: Semglee 8 units Daily      Novolog Sensitive Correction Scale/ SSI (0-9 units) TID AC + HS     Novolog 3 units TID with meals   Pt well known to our team.  She tends to have extremely labile CBGs in the hospital.  From previous admissions, she will eat in between meals, and when she does not get Novolog coverage for her food intake, she will spike very high.  Then we have to be cautious not to over-medicate her with too much insulin b/c she has the tendency to bottom out with large doses of Novolog.    Note CBG 434 this AM--Did pt eat prior to CBG being taken this AM?--per RN, pt states she ate sandwich at 1am.  Recs for today:  1. Leave Semglee at 8 units Daily  2. Increase Novolog Meal  Coverage slightly to 4 units TID with meals  3. Increase frequency of the current Novolog SSI to Q4 hours  Recommendations reviewed with RN and MD and permission given by MD to make above adjustments.    --Will follow patient during hospitalization--  Wyn Quaker RN, MSN, [facility] Diabetes Coordinator Inpatient Glycemic Control Team Team Pager: [phone] (8a-5p)

## 2021-09-11 NOTE — TOC CM/SW Note (Signed)
Left voicemail for Brittany [name], APS social worker to see if there are any updates.  Dayton Scrape, [place]

## 2021-09-11 NOTE — Assessment & Plan Note (Signed)
Dr. Tasia Catchings is coordinating outpatient follow-up with [facility] oncology as patient lost follow-up.  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 CTAP on admission found continued slight enlargement of pancreatic uncinate process complex cystic lesion, favored mixed branch IPMN.Radiology has recommended follow-upMRCP/MRI in outpatient setting(apparently completed, formal read not available to me at this point)." Continue home pancrealipase 1 capsule TIDAC

## 2021-09-11 NOTE — Assessment & Plan Note (Signed)
-    hold off statin therapy given elevated transaminases while in the hospital.  Resume at discharge

## 2021-09-11 NOTE — Consult Note (Signed)
[name]   Reason for Consult: Follow-up consult 61 year old woman with history of delirium related to multiple medical problems including a history of stroke.  Had been seen previously by psychiatry but there was concern that behaviors may have escalated Referring Physician:  Manuella Ghazi Patient Identification: Brittany [name] MRN:  [id number] Principal Diagnosis: Acute encephalopathy Diagnosis:  Active Problems:   IPMN (intraductal papillary mucinous neoplasm)   Acute metabolic encephalopathy   Hyperkalemia   Community acquired bilateral lower lobe pneumonia   Uncontrolled type 2 diabetes mellitus with hypoglycemia, with long-term current use of insulin (HCC)   Dyslipidemia   Elevated LFTs   Anxiety and depression   Total Time spent with patient: 30 minutes  Subjective:   Brittany [name] is a 61 y.o. female patient admitted with "I am hungry".  HPI: Follow-up for this 61 year old woman who was psychiatric history really only seems to have started after having strokes and multiple medical problems.  Has a history of some mild depression in the past but when sick has had episodes of delirium with agitation.  Patient apparently had been doing reasonably well but it was reported by staff that she had been shaking the IV pole or acting confused more frequently.  Came to see patient in her room.  She had just finished walking around the unit.  Her chief complaint was that she was hungry and that she did not get enough food here.  She recognized that as a diabetic her diet had to be watched closely but she was still dissatisfied.  Otherwise her mood she said was fine.  She was alert and oriented.  She was oriented to the fact that there was a discharge plan for her to leave tomorrow and to go home which according to the chart is correct.  Patient says that she has long had trouble sleeping at night ever since her husband passed away.  Still has trouble sleeping here.  The sitter  tells me the patient still has episodes of confusion at times especially when she just wakes up but more or less she has done well today.  She is currently on olanzapine in the evening low-dose mirtazapine no Depakote that I can see.  Only some as needed benzodiazepines.  Past Psychiatric History: Some history of mild depression but no psychiatric hospitalization and no suicide attempts.  Most psychiatric intervention over the last year has been related to confusion and delirium when she has been sick with abnormal blood sugars and strokes and other medical problems looks like the last psychiatric intervention when she was in the hospital in March some of her medicines were changed and she was discharged on Depakote as well as the olanzapine  Risk to Self:   Risk to Others:   Prior Inpatient Therapy:   Prior Outpatient Therapy:    Past Medical History:  Past Medical History:  Diagnosis Date   C. 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   PFTs on 06/15/15 with FEV1/FVC ratio of 64%, FEV1 83%, DLCO 47%   Diabetes mellitus without complication (HCC)    Dyslipidemia    History of Clostridium difficile colitis 03/03/2021   Hyperosmolar hyperglycemic state (HHS) ([place]) 11/18/2020   Hypertension    Hypokalemia; hyperkalemia 07/12/2019   Lactic acidosis 11/19/2020   Lupus (HCC)    Pancreatic cancer ([place])    RLS (restless legs syndrome)  Seizure disorder [facility])    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  History of anxiety in the family Social History:  Social History   Substance and Sexual Activity  Alcohol Use Yes     Social History   Substance and Sexual Activity  Drug Use Not Currently    Social History   Socioeconomic History   Marital status: Widowed    Spouse name: Not on file   Number of children: Not on file   Years of education: Not on file   Highest education level: Not on file  Occupational History   Not on file  Tobacco Use   Smoking status: Former    Types: Cigarettes    Passive exposure: Past   Smokeless tobacco: Never  Substance and Sexual Activity   Alcohol use: Yes   Drug use: Not Currently   Sexual activity: Not on file  Other Topics Concern   Not on file  Social History Narrative   Not on file   Social Determinants of Health   Financial Resource Strain: Not on file  Food Insecurity: Not on file  Transportation Needs: Not on file  Physical Activity: Not on file  Stress: Not on file  Social Connections: Not on file   Additional Social History:    Allergies:   Allergies  Allergen Reactions   Cephalosporins Itching    TOLERATED ZOSYN (PIPERACILLIN) BEFORE   Hydromorphone Itching   Keflin [Cephalothin] Itching   Lactose Intolerance (Gi) Diarrhea    Labs:  Results for orders placed or performed during the hospital encounter of 09/04/21 (from the past 48 hour(s))  Glucose, capillary     Status: Abnormal   Collection Time: 09/09/21  9:00 PM  Result Value Ref Range   Glucose-Capillary 496 (H) 70 - 99 mg/dL    Comment: Glucose reference range applies only to samples taken after fasting for at least 8 hours.  Glucose, random     Status: Abnormal   Collection Time: 09/09/21  9:34 PM  Result Value Ref Range   Glucose, Bld 507 (HH) 70 - 99 mg/dL    Comment: CRITICAL RESULT CALLED TO, READ BACK BY AND VERIFIED  WITH FRANSISCA OKASOR 09/09/21 2215 DE Glucose reference range applies only to samples taken after fasting for at least 8 hours. Performed at [facility], [address] [place], [place] [address]   Glucose, capillary     Status: Abnormal   Collection Time: 09/10/21  2:56 AM  Result Value Ref Range   Glucose-Capillary 184 (H) 70 - 99 mg/dL    Comment: Glucose reference range applies only to samples taken after fasting for at least 8 hours.  CBC     Status: Abnormal   Collection Time: 09/10/21  4:53 AM  Result Value Ref Range   WBC 12.5 (H) 4.0 - 10.5 K/uL   RBC 3.59 (L) 3.87 - 5.11 MIL/uL   Hemoglobin 10.4 (L) 12.0 - 15.0 g/dL   HCT 33.4 (  L) 36.0 - 46.0 %   MCV 93.0 80.0 - 100.0 fL   MCH 29.0 26.0 - 34.0 pg   MCHC 31.1 30.0 - 36.0 g/dL   RDW 20.3 (H) 11.5 - 15.5 %   Platelets 438 (H) 150 - 400 K/uL   nRBC 0.7 (H) 0.0 - 0.2 %    Comment: Performed at [facility], [address] [place], [place] [address]  Basic metabolic panel     Status: Abnormal   Collection Time: 09/10/21  4:53 AM  Result Value Ref Range   Sodium 136 135 - 145 mmol/L   Potassium 4.6 3.5 - 5.1 mmol/L   Chloride 111 98 - 111 mmol/L   CO2 18 (L) 22 - 32 mmol/L   Glucose, Bld 366 (H) 70 - 99 mg/dL    Comment: Glucose reference range applies only to samples taken after fasting for at least 8 hours.   BUN 13 8 - 23 mg/dL   Creatinine, Ser 0.81 0.44 - 1.00 mg/dL   Calcium 8.9 8.9 - 10.3 mg/dL   GFR, Estimated >60 >60 mL/min    Comment: (NOTE) Calculated using the CKD-EPI Creatinine Equation (2021)    Anion gap 7 5 - 15    Comment: Performed at [facility], [address] [place], [place] [address]  Glucose, capillary     Status: Abnormal   Collection Time: 09/10/21  8:24 AM  Result Value Ref Range   Glucose-Capillary 337 (H) 70 - 99 mg/dL    Comment: Glucose reference range applies only to samples taken after fasting for at least 8 hours.  Glucose, capillary     Status:  Abnormal   Collection Time: 09/10/21 10:58 AM  Result Value Ref Range   Glucose-Capillary 455 (H) 70 - 99 mg/dL    Comment: Glucose reference range applies only to samples taken after fasting for at least 8 hours.  Glucose, capillary     Status: Abnormal   Collection Time: 09/10/21  4:34 PM  Result Value Ref Range   Glucose-Capillary 185 (H) 70 - 99 mg/dL    Comment: Glucose reference range applies only to samples taken after fasting for at least 8 hours.  Glucose, capillary     Status: Abnormal   Collection Time: 09/10/21  8:51 PM  Result Value Ref Range   Glucose-Capillary 178 (H) 70 - 99 mg/dL    Comment: Glucose reference range applies only to samples taken after fasting for at least 8 hours.  Glucose, capillary     Status: Abnormal   Collection Time: 09/11/21  8:20 AM  Result Value Ref Range   Glucose-Capillary 434 (H) 70 - 99 mg/dL    Comment: Glucose reference range applies only to samples taken after fasting for at least 8 hours.   Comment 1 Notify RN   Glucose, capillary     Status: Abnormal   Collection Time: 09/11/21  9:47 AM  Result Value Ref Range   Glucose-Capillary 394 (H) 70 - 99 mg/dL    Comment: Glucose reference range applies only to samples taken after fasting for at least 8 hours.  Glucose, capillary     Status: Abnormal   Collection Time: 09/11/21 11:59 AM  Result Value Ref Range   Glucose-Capillary 291 (H) 70 - 99 mg/dL    Comment: Glucose reference range applies only to samples taken after fasting for at least 8 hours.  Glucose, capillary     Status: None   Collection Time: 09/11/21  3:24 PM  Result Value Ref Range   Glucose-Capillary 85 70 - 99 mg/dL    Comment: Glucose reference range applies only to samples taken after fasting for at least 8 hours.  CBC     Status: Abnormal   Collection Time: 09/11/21  4:31 PM  Result Value Ref Range   WBC 9.1 4.0 - 10.5 K/uL   RBC 3.71 (L) 3.87 - 5.11 MIL/uL   Hemoglobin 10.6 (L) 12.0 - 15.0 g/dL   HCT 33.7 (L)  36.0 - 46.0 %   MCV 90.8 80.0 - 100.0 fL   MCH 28.6 26.0 - 34.0 pg   MCHC 31.5 30.0 - 36.0 g/dL   RDW 19.5 (H) 11.5 - 15.5 %   Platelets 440 (H) 150 - 400 K/uL   nRBC 2.0 (H) 0.0 - 0.2 %    Comment: Performed at [facility], [address] [place], [place] [address]  Comprehensive metabolic panel     Status: Abnormal   Collection Time: 09/11/21  4:31 PM  Result Value Ref Range   Sodium 140 135 - 145 mmol/L   Potassium 5.0 3.5 - 5.1 mmol/L   Chloride 112 (H) 98 - 111 mmol/L   CO2 19 (L) 22 - 32 mmol/L   Glucose, Bld 121 (H) 70 - 99 mg/dL    Comment: Glucose reference range applies only to samples taken after fasting for at least 8 hours.   BUN 15 8 - 23 mg/dL   Creatinine, Ser 0.74 0.44 - 1.00 mg/dL   Calcium 9.4 8.9 - 10.3 mg/dL   Total Protein 7.7 6.5 - 8.1 g/dL   Albumin 3.2 (L) 3.5 - 5.0 g/dL   AST 31 15 - 41 U/L   ALT 47 (H) 0 - 44 U/L   Alkaline Phosphatase 137 (H) 38 - 126 U/L   Total Bilirubin 0.5 0.3 - 1.2 mg/dL   GFR, Estimated >60 >60 mL/min    Comment: (NOTE) Calculated using the CKD-EPI Creatinine Equation (2021)    Anion gap 9 5 - 15    Comment: Performed at [facility], [address] [place], [place] [address]    Current Facility-Administered Medications  Medication Dose Route Frequency Provider Last Rate Last Admin   acetaminophen (TYLENOL) tablet 650 mg  650 mg Oral Q6H PRN Mansy, Jan A, MD   650 mg at 09/10/21 2339   Or   acetaminophen (TYLENOL) suppository 650 mg  650 mg Rectal Q6H PRN Mansy, Jan A, MD       aspirin EC tablet 81 mg  81 mg Oral Daily Mansy, Jan A, MD   81 mg at 09/10/21 1133   enoxaparin (LOVENOX) injection 40 mg  40 mg Subcutaneous Q24H Chinita Greenland A, RPH   40 mg at 09/10/21 2112   famotidine (PEPCID) tablet 20 mg  20 mg Oral Daily Mansy, Jan A, MD   20 mg at 09/11/21 4401   feeding supplement (BOOST / RESOURCE BREEZE) liquid 1 Container  1 Container Oral TID BM Nolberto Hanlon, MD   1 Container at 09/11/21 0900    guaiFENesin (MUCINEX) 12 hr tablet 600 mg  600 mg Oral BID Mansy, Jan A, MD   600 mg at 09/11/21 0848   insulin aspart (novoLOG) injection 0-5 Units  0-5 Units Subcutaneous QHS Mansy, Jan A, MD   5 Units at 09/09/21 2134   insulin aspart (novoLOG) injection 0-9 Units  0-9 Units Subcutaneous Q4H Max Sane, MD   5 Units at 09/11/21 1314   insulin  aspart (novoLOG) injection 4 Units  4 Units Subcutaneous TID WC Max Sane, MD   4 Units at 09/11/21 1315   insulin glargine-yfgn (SEMGLEE) injection 8 Units  8 Units Subcutaneous Daily Nolberto Hanlon, MD   8 Units at 09/11/21 0853   LORazepam (ATIVAN) injection 0.5 mg  0.5 mg Intramuscular Once Nolberto Hanlon, MD       magnesium hydroxide (MILK OF MAGNESIA) suspension 30 mL  30 mL Oral Daily PRN Mansy, Jan A, MD       mirtazapine (REMERON) tablet 15 mg  15 mg Oral QHS Mansy, Jan A, MD   15 mg at 09/10/21 2112   multivitamin with minerals tablet 1 tablet  1 tablet Oral Daily Mansy, Jan A, MD   1 tablet at 09/11/21 5374   OLANZapine (ZYPREXA) tablet 7.5 mg  7.5 mg Oral QHS Mansy, Jan A, MD   7.5 mg at 09/10/21 2111   ondansetron (ZOFRAN) tablet 4 mg  4 mg Oral Q6H PRN Mansy, Jan A, MD       Or   ondansetron [facility]) injection 4 mg  4 mg Intravenous Q6H PRN Mansy, Jan A, MD       Oral care mouth rinse  15 mL Mouth Rinse PRN Mansy, Jan A, MD       rOPINIRole (REQUIP) tablet 0.5 mg  0.5 mg Oral QHS Mansy, Jan A, MD   0.5 mg at 09/10/21 2111   traZODone (DESYREL) tablet 25 mg  25 mg Oral QHS PRN Mansy, Jan A, MD   25 mg at 09/10/21 2112    Musculoskeletal: Strength & Muscle Tone: within normal limits Gait & Station: unsteady Patient leans: N/A            Psychiatric Specialty Exam:  Presentation  General Appearance: Appropriate for Environment  Eye Contact:Good  Speech:Clear and Coherent  Speech Volume:Normal  Handedness:No data recorded  Mood and Affect  Mood:Euthymic (mildy anxious. Wants food)  Affect:Appropriate   Thought  Process  Thought Processes:Coherent  Descriptions of Associations:Intact  Orientation:Full (Time, Place and Person)  Thought Content:Logical  History of Schizophrenia/Schizoaffective disorder:No data recorded Duration of Psychotic Symptoms:No data recorded Hallucinations:No data recorded Ideas of Reference:None  Suicidal Thoughts:No data recorded Homicidal Thoughts:No data recorded  Sensorium  Memory:Immediate Fair  Judgment:Fair  Insight:Fair   Executive Functions  Concentration:Fair  Attention Span:Fair  [place]   Psychomotor Activity  Psychomotor Activity:No data recorded  Assets  Assets:Financial Resources/Insurance; Desire for Improvement; Resilience   Sleep  Sleep:No data recorded  Physical Exam: Physical Exam Vitals and nursing note reviewed.  Constitutional:      Appearance: Normal appearance.  HENT:     Head: Normocephalic and atraumatic.     Mouth/Throat:     Pharynx: Oropharynx is clear.  Eyes:     Pupils: Pupils are equal, round, and reactive to light.  Cardiovascular:     Rate and Rhythm: Normal rate and regular rhythm.  Pulmonary:     Effort: Pulmonary effort is normal.     Breath sounds: Normal breath sounds.  Abdominal:     General: Abdomen is flat.     Palpations: Abdomen is soft.  Musculoskeletal:        General: Normal range of motion.  Skin:    General: Skin is warm and dry.  Neurological:     General: No focal deficit present.     Mental Status: She is alert. Mental status is at baseline.  Psychiatric:  Attention and Perception: Attention normal.        Mood and Affect: Mood normal.        Speech: Speech normal.        Behavior: Behavior normal.        Thought Content: Thought content normal.        Cognition and Memory: Cognition is impaired. Memory is impaired.        Judgment: Judgment normal.    Review of Systems  Constitutional: Negative.   HENT: Negative.     Eyes: Negative.   Respiratory: Negative.    Cardiovascular: Negative.   Gastrointestinal: Negative.   Musculoskeletal: Negative.   Skin: Negative.   Neurological: Negative.   Psychiatric/Behavioral:  Positive for memory loss. Negative for depression and suicidal ideas. The patient has insomnia.    Blood pressure (!) 125/92, pulse (!) 102, temperature 98 F (36.7 C), temperature source Oral, resp. rate 18, height 5' 2.99" (1.6 m), weight 48 kg, SpO2 100 %. Body mass index is 18.75 kg/m.  Treatment Plan Summary: Plan at least to my evaluation today the patient seems to be pretty stable.  Certainly possible that with her medical problems especially as her blood sugar changes she may wax and wane and have more less irritability but at this point I do not see a indication for risking adding or changing any medication.  Patient is calm now with no report of any attempt to elope and seems to be cooperative with the whole treatment plan.  Supportive counseling for the patient.  Reviewed medications.  No change for now but let us know if we can be of help.  Disposition: No evidence of imminent risk to self or others at present.   Patient does not meet criteria for psychiatric inpatient admission. Supportive therapy provided about ongoing stressors.  Alethia Berthold, MD 09/11/2021 5:41 PM

## 2021-09-11 NOTE — Assessment & Plan Note (Signed)
-    hold off statin therapy.  Can resume on discharge

## 2021-09-11 NOTE — Assessment & Plan Note (Signed)
-   continue her Zyprexa and Remeron. - Continue Remeron, trazodone, Zyprexa and Ativan per psychiatry.  Psychiatry reevaluation appreciated

## 2021-09-11 NOTE — Assessment & Plan Note (Signed)
-   likely secondary to hypoglycemia and exacerbated by pneumonia. - Now resolved.

## 2021-09-11 NOTE — Assessment & Plan Note (Addendum)
-   Continue NovoLog 4 units 3 times daily with each meals - continue supplement coverage with NovoLog.  Added insulin Semglee 89 subcu daily

## 2021-09-11 NOTE — Plan of Care (Signed)
Hosp notified of Critical AM CBG [id number].
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
## 2021-09-12 ENCOUNTER — Telehealth: Payer: Self-pay | Admitting: Oncology

## 2021-09-12 DIAGNOSIS — E162 Hypoglycemia, unspecified: Secondary | ICD-10-CM

## 2021-09-12 DIAGNOSIS — G9340 Encephalopathy, unspecified: Secondary | ICD-10-CM | POA: Diagnosis not present

## 2021-09-12 DIAGNOSIS — K8689 Other specified diseases of pancreas: Secondary | ICD-10-CM | POA: Diagnosis not present

## 2021-09-12 DIAGNOSIS — G9341 Metabolic encephalopathy: Secondary | ICD-10-CM | POA: Diagnosis not present

## 2021-09-12 LAB — GLUCOSE, CAPILLARY
Glucose-Capillary: 169 mg/dL — ABNORMAL HIGH (ref 70–99)
Glucose-Capillary: 176 mg/dL — ABNORMAL HIGH (ref 70–99)
Glucose-Capillary: 357 mg/dL — ABNORMAL HIGH (ref 70–99)
Glucose-Capillary: 89 mg/dL (ref 70–99)

## 2021-09-12 NOTE — Telephone Encounter (Signed)
Called [facility]'s office and left VM.  Talked to Earnestine Mealing today and patient has an appt with Dr.Allen on 11/24/21.  If patient has questions, she can call triage line at  [phone].  I updated Dr.Shah to inform patient at discharge.

## 2021-09-12 NOTE — TOC Transition Note (Addendum)
Transition of Care [facility]) - CM/SW Discharge Note   Patient Details  Name: Brittany [name] MRN: [id number] Date of Birth: 20-Nov-1960  Transition of Care [facility]) CM/SW Contact:  Candie Chroman, LCSW Phone Number: 09/12/2021, 9:14 AM   Clinical Narrative:  Patient has orders to discharge home today. Left message for [facility] representative to notify. Daughter said she thinks patient's friend is going to pick her up and will call to confirm. Asked her to call CSW or the unit to give pickup time. Received call back from Angelica with the CAP program. Gave referral information. They will mail paperwork to the home for PCP to fill out. Angelica also emailed PCS paperwork for PCP to fill out. Emailed copy to daughter and will also send a copy in her discharge packet. Daughter is aware. No further concerns. CSW signing off.  4:04 pm: Daughter has been unable to find patient a ride home. Set up transport through [employer]. They will be here in 5 minutes to pick her up. Daughter is aware.  Final next level of care: [place] Barriers to Discharge: Barriers Resolved   Patient Goals and CMS Choice     Choice offered to / list presented to : Adult Children  Discharge Placement                Patient to be transferred to facility by: Friend Name of family member notified: Vicente Males Patient and family notified of of transfer: 09/12/21  Discharge Plan and Services     Post Acute Care Choice: Home Health                    HH Arranged: RN, PT, OT, Nurse's Aide [facility] Agency: [place] Date [place]: 09/12/21   Representative spoke with at [place]: [place] ([place]) Interventions     Readmission Risk Interventions    03/06/2021    9:13 AM 02/16/2021    2:45 PM  Readmission Risk Prevention Plan  Transportation Screening Complete Complete  Medication Review (RN Care Manager) Complete Complete   PCP or Specialist appointment within 3-5 days of discharge Complete   SW Recovery Care/Counseling Consult  Complete  Palliative Care Screening Not Applicable Not [place] Not Applicable Not Applicable

## 2021-09-12 NOTE — Plan of Care (Signed)
Pt AAOx3, confused. VS are stable. AVS and education provided to patient and daughter. IV removed with no complications. Pt transported by [facility] to main lobby where a transportation service will transport her home.

## 2021-09-12 NOTE — Care Management Important Message (Signed)
Important Message  Patient Details  Name: Brittany [name] MRN: [id number] Date of Birth: Apr 09, 1960   Medicare Important Message Given:  Other (see comment)  Attempted to reach patient via room phone to review Medicare IM due to contact isolation status.  Per staff, patient asleep upon time of attempt.    Attempted to reach Vicente Males, daughter, at [phone].  Left message to review Medicare IM.  Encouraged callback.   [name] 09/12/2021, 10:38 AM
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
## 2021-09-12 NOTE — Progress Notes (Signed)
Mobility Specialist - Progress Note    09/12/21 1600  Mobility  Activity Ambulated independently in hallway  Level of Assistance Independent  Assistive Device None  Distance Ambulated (ft) 160 ft  Activity Response Tolerated well  $Mobility charge 1 Mobility    Merrily Brittle Mobility Specialist 09/12/21, 4:10 PM

## 2021-09-21 ENCOUNTER — Other Ambulatory Visit: Payer: Self-pay

## 2021-09-21 ENCOUNTER — Emergency Department: Payer: Medicare Other

## 2021-09-21 ENCOUNTER — Encounter: Payer: Self-pay | Admitting: Emergency Medicine

## 2021-09-21 ENCOUNTER — Inpatient Hospital Stay
Admission: EM | Admit: 2021-09-21 | Discharge: 2021-09-24 | DRG: 637 | Disposition: A | Discharge status: Home Health | Payer: Medicare Other | Attending: Internal Medicine | Admitting: Internal Medicine

## 2021-09-21 DIAGNOSIS — L89152 Pressure ulcer of sacral region, stage 2: Secondary | ICD-10-CM | POA: Diagnosis present

## 2021-09-21 DIAGNOSIS — Z818 Family history of other mental and behavioral disorders: Secondary | ICD-10-CM

## 2021-09-21 DIAGNOSIS — E875 Hyperkalemia: Secondary | ICD-10-CM | POA: Diagnosis present

## 2021-09-21 DIAGNOSIS — R627 Adult failure to thrive: Secondary | ICD-10-CM | POA: Diagnosis present

## 2021-09-21 DIAGNOSIS — G4700 Insomnia, unspecified: Secondary | ICD-10-CM | POA: Diagnosis present

## 2021-09-21 DIAGNOSIS — F03918 Unspecified dementia, unspecified severity, with other behavioral disturbance: Secondary | ICD-10-CM | POA: Diagnosis present

## 2021-09-21 DIAGNOSIS — G2581 Restless legs syndrome: Secondary | ICD-10-CM | POA: Diagnosis present

## 2021-09-21 DIAGNOSIS — Z6820 Body mass index (BMI) 20.0-20.9, adult: Secondary | ICD-10-CM

## 2021-09-21 DIAGNOSIS — E11649 Type 2 diabetes mellitus with hypoglycemia without coma: Secondary | ICD-10-CM | POA: Diagnosis present

## 2021-09-21 DIAGNOSIS — Z794 Long term (current) use of insulin: Secondary | ICD-10-CM

## 2021-09-21 DIAGNOSIS — J449 Chronic obstructive pulmonary disease, unspecified: Secondary | ICD-10-CM | POA: Diagnosis present

## 2021-09-21 DIAGNOSIS — Z8507 Personal history of malignant neoplasm of pancreas: Secondary | ICD-10-CM

## 2021-09-21 DIAGNOSIS — Z8673 Personal history of transient ischemic attack (TIA), and cerebral infarction without residual deficits: Secondary | ICD-10-CM

## 2021-09-21 DIAGNOSIS — Z8619 Personal history of other infectious and parasitic diseases: Secondary | ICD-10-CM

## 2021-09-21 DIAGNOSIS — F0394 Unspecified dementia, unspecified severity, with anxiety: Secondary | ICD-10-CM | POA: Diagnosis present

## 2021-09-21 DIAGNOSIS — E1100 Type 2 diabetes mellitus with hyperosmolarity without nonketotic hyperglycemic-hyperosmolar coma (NKHHC): Principal | ICD-10-CM | POA: Diagnosis present

## 2021-09-21 DIAGNOSIS — G9341 Metabolic encephalopathy: Secondary | ICD-10-CM | POA: Diagnosis present

## 2021-09-21 DIAGNOSIS — E43 Unspecified severe protein-calorie malnutrition: Secondary | ICD-10-CM | POA: Diagnosis present

## 2021-09-21 DIAGNOSIS — Z9081 Acquired absence of spleen: Secondary | ICD-10-CM

## 2021-09-21 DIAGNOSIS — N179 Acute kidney failure, unspecified: Secondary | ICD-10-CM | POA: Diagnosis present

## 2021-09-21 DIAGNOSIS — G40909 Epilepsy, unspecified, not intractable, without status epilepticus: Secondary | ICD-10-CM | POA: Diagnosis present

## 2021-09-21 DIAGNOSIS — Z803 Family history of malignant neoplasm of breast: Secondary | ICD-10-CM

## 2021-09-21 DIAGNOSIS — Z933 Colostomy status: Secondary | ICD-10-CM | POA: Diagnosis not present

## 2021-09-21 DIAGNOSIS — L8990 Pressure ulcer of unspecified site, unspecified stage: Secondary | ICD-10-CM

## 2021-09-21 DIAGNOSIS — Z87891 Personal history of nicotine dependence: Secondary | ICD-10-CM | POA: Diagnosis not present

## 2021-09-21 DIAGNOSIS — I10 Essential (primary) hypertension: Secondary | ICD-10-CM | POA: Diagnosis present

## 2021-09-21 DIAGNOSIS — R569 Unspecified convulsions: Secondary | ICD-10-CM

## 2021-09-21 DIAGNOSIS — Z90411 Acquired partial absence of pancreas: Secondary | ICD-10-CM

## 2021-09-21 DIAGNOSIS — E1165 Type 2 diabetes mellitus with hyperglycemia: Secondary | ICD-10-CM | POA: Diagnosis present

## 2021-09-21 DIAGNOSIS — E785 Hyperlipidemia, unspecified: Secondary | ICD-10-CM | POA: Diagnosis present

## 2021-09-21 DIAGNOSIS — M329 Systemic lupus erythematosus, unspecified: Secondary | ICD-10-CM | POA: Diagnosis present

## 2021-09-21 DIAGNOSIS — R739 Hyperglycemia, unspecified: Principal | ICD-10-CM

## 2021-09-21 LAB — COMPREHENSIVE METABOLIC PANEL
ALT: 35 U/L (ref 0–44)
AST: 36 U/L (ref 15–41)
Albumin: 4.1 g/dL (ref 3.5–5.0)
Alkaline Phosphatase: 182 U/L — ABNORMAL HIGH (ref 38–126)
Anion gap: 13 (ref 5–15)
BUN: 37 mg/dL — ABNORMAL HIGH (ref 8–23)
CO2: 20 mmol/L — ABNORMAL LOW (ref 22–32)
Calcium: 11 mg/dL — ABNORMAL HIGH (ref 8.9–10.3)
Chloride: 90 mmol/L — ABNORMAL LOW (ref 98–111)
Creatinine, Ser: 1.26 mg/dL — ABNORMAL HIGH (ref 0.44–1.00)
GFR, Estimated: 49 mL/min — ABNORMAL LOW (ref >60)
Glucose, Bld: 879 mg/dL (ref 70–99)
Potassium: 6.4 mmol/L (ref 3.5–5.1)
Sodium: 123 mmol/L — ABNORMAL LOW (ref 135–145)
Total Bilirubin: 0.7 mg/dL (ref 0.3–1.2)
Total Protein: 10.7 g/dL — ABNORMAL HIGH (ref 6.5–8.1)

## 2021-09-21 LAB — CBC WITH DIFFERENTIAL/PLATELET
Abs Immature Granulocytes: 0.05 10*3/uL (ref 0.00–0.07)
Basophils Absolute: 0 10*3/uL (ref 0.0–0.1)
Basophils Relative: 0 %
Eosinophils Absolute: 0 10*3/uL (ref 0.0–0.5)
Eosinophils Relative: 0 %
HCT: 44.5 % (ref 36.0–46.0)
Hemoglobin: 14.3 g/dL (ref 12.0–15.0)
Immature Granulocytes: 1 %
Lymphocytes Relative: 26 %
Lymphs Abs: 2.2 10*3/uL (ref 0.7–4.0)
MCH: 28.4 pg (ref 26.0–34.0)
MCHC: 32.1 g/dL (ref 30.0–36.0)
MCV: 88.5 fL (ref 80.0–100.0)
Monocytes Absolute: 0.3 10*3/uL (ref 0.1–1.0)
Monocytes Relative: 4 %
Neutro Abs: 5.8 10*3/uL (ref 1.7–7.7)
Neutrophils Relative %: 69 %
Platelets: 512 10*3/uL — ABNORMAL HIGH (ref 150–400)
RBC: 5.03 MIL/uL (ref 3.87–5.11)
RDW: 17.4 % — ABNORMAL HIGH (ref 11.5–15.5)
WBC: 8.3 10*3/uL (ref 4.0–10.5)
nRBC: 0 % (ref 0.0–0.2)

## 2021-09-21 LAB — OSMOLALITY: Osmolality: 315 mOsm/kg — ABNORMAL HIGH (ref 275–295)

## 2021-09-21 LAB — BLOOD GAS, VENOUS
Acid-base deficit: 8.9 mmol/L — ABNORMAL HIGH (ref 0.0–2.0)
Acid-base deficit: 1.9 mmol/L (ref 0.0–2.0)
Bicarbonate: 19.5 mmol/L — ABNORMAL LOW (ref 20.0–28.0)
Bicarbonate: 24.3 mmol/L (ref 20.0–28.0)
O2 Saturation: 55.3 %
O2 Saturation: 26.2 %
Patient temperature: 37
Patient temperature: 37
pCO2, Ven: 51 mmHg (ref 44–60)
pCO2, Ven: 46 mmHg (ref 44–60)
pH, Ven: 7.19 — CL (ref 7.25–7.43)
pH, Ven: 7.33 (ref 7.25–7.43)
pO2, Ven: 37 mmHg (ref 32–45)
pO2, Ven: <31 mmHg — CL (ref 32–45)

## 2021-09-21 LAB — BASIC METABOLIC PANEL
Anion gap: 7 (ref 5–15)
Anion gap: 7 (ref 5–15)
BUN: 35 mg/dL — ABNORMAL HIGH (ref 8–23)
BUN: 29 mg/dL — ABNORMAL HIGH (ref 8–23)
CO2: 20 mmol/L — ABNORMAL LOW (ref 22–32)
CO2: 19 mmol/L — ABNORMAL LOW (ref 22–32)
Calcium: 10.2 mg/dL (ref 8.9–10.3)
Calcium: 9.9 mg/dL (ref 8.9–10.3)
Chloride: 102 mmol/L (ref 98–111)
Chloride: 105 mmol/L (ref 98–111)
Creatinine, Ser: 1.02 mg/dL — ABNORMAL HIGH (ref 0.44–1.00)
Creatinine, Ser: 0.81 mg/dL (ref 0.44–1.00)
GFR, Estimated: >60 mL/min (ref >60)
GFR, Estimated: >60 mL/min (ref >60)
Glucose, Bld: 529 mg/dL (ref 70–99)
Glucose, Bld: 263 mg/dL — ABNORMAL HIGH (ref 70–99)
Potassium: 4.8 mmol/L (ref 3.5–5.1)
Potassium: 4.6 mmol/L (ref 3.5–5.1)
Sodium: 129 mmol/L — ABNORMAL LOW (ref 135–145)
Sodium: 131 mmol/L — ABNORMAL LOW (ref 135–145)

## 2021-09-21 LAB — CBG MONITORING, ED
Glucose-Capillary: >600 mg/dL (ref 70–99)
Glucose-Capillary: >600 mg/dL (ref 70–99)
Glucose-Capillary: >600 mg/dL (ref 70–99)

## 2021-09-21 LAB — GLUCOSE, CAPILLARY
Glucose-Capillary: 419 mg/dL — ABNORMAL HIGH (ref 70–99)
Glucose-Capillary: 550 mg/dL (ref 70–99)
Glucose-Capillary: 378 mg/dL — ABNORMAL HIGH (ref 70–99)
Glucose-Capillary: 270 mg/dL — ABNORMAL HIGH (ref 70–99)
Glucose-Capillary: 157 mg/dL — ABNORMAL HIGH (ref 70–99)
Glucose-Capillary: 174 mg/dL — ABNORMAL HIGH (ref 70–99)

## 2021-09-21 LAB — MRSA NEXT GEN BY PCR, NASAL: MRSA by PCR Next Gen: NOT DETECTED

## 2021-09-21 LAB — LACTIC ACID, PLASMA
Lactic Acid, Venous: 2.7 mmol/L (ref 0.5–1.9)
Lactic Acid, Venous: 3.3 mmol/L (ref 0.5–1.9)

## 2021-09-21 LAB — CK: Total CK: 74 U/L (ref 38–234)

## 2021-09-21 MED ORDER — ENOXAPARIN SODIUM 40 MG/0.4ML IJ SOSY: 40.0000 mg | PREFILLED_SYRINGE | INTRAMUSCULAR | Status: DC

## 2021-09-21 MED ORDER — LACTATED RINGERS IV BOLUS
20.0000 mL/kg | Freq: Once | INTRAVENOUS | Status: AC
  Administered 2021-09-21: 908 mL via INTRAVENOUS

## 2021-09-21 MED ORDER — DEXTROSE IN LACTATED RINGERS 5 % IV SOLN: INTRAVENOUS | Status: DC

## 2021-09-21 MED ORDER — PATIROMER SORBITEX CALCIUM 8.4 G PO PACK
16.8000 g | PACK | Freq: Every day | ORAL | Status: DC
  Administered 2021-09-21: 16.8 g via ORAL
  Filled 2021-09-21 (×2): qty 2

## 2021-09-21 MED ORDER — LACTATED RINGERS IV BOLUS
1000.0000 mL | Freq: Once | INTRAVENOUS | Status: AC
  Administered 2021-09-21: 1000 mL via INTRAVENOUS

## 2021-09-21 MED ORDER — ROPINIROLE HCL 1 MG PO TABS
0.5000 mg | ORAL_TABLET | Freq: Every day | ORAL | Status: DC
Start: 2021-09-21 — End: 2021-09-24
  Administered 2021-09-21 – 2021-09-23 (×3): 0.5 mg via ORAL
  Filled 2021-09-21: qty 1
  Filled 2021-09-21: qty 2
  Filled 2021-09-21: qty 1
  Filled 2021-09-21: qty 2

## 2021-09-21 MED ORDER — ORAL CARE MOUTH RINSE: 15.0000 mL | OROMUCOSAL | Status: DC | PRN

## 2021-09-21 MED ORDER — HYDRALAZINE HCL 20 MG/ML IJ SOLN: 10.0000 mg | INTRAMUSCULAR | Status: DC | PRN

## 2021-09-21 MED ORDER — LACTATED RINGERS IV SOLN: INTRAVENOUS | Status: DC

## 2021-09-21 MED ORDER — SODIUM CHLORIDE 0.9 % IV BOLUS
1000.0000 mL | Freq: Once | INTRAVENOUS | Status: AC
  Administered 2021-09-21: 1000 mL via INTRAVENOUS

## 2021-09-21 MED ORDER — FAMOTIDINE 20 MG PO TABS
20.0000 mg | ORAL_TABLET | Freq: Every day | ORAL | Status: DC
  Administered 2021-09-21 – 2021-09-24 (×4): 20 mg via ORAL
  Filled 2021-09-21 (×4): qty 1

## 2021-09-21 MED ORDER — LACTATED RINGERS IV SOLN
INTRAVENOUS | Status: DC
Start: 2021-09-21 — End: 2021-09-22

## 2021-09-21 MED ORDER — CHLORHEXIDINE GLUCONATE CLOTH 2 % EX PADS
6.0000 | MEDICATED_PAD | Freq: Every day | CUTANEOUS | Status: DC
Start: 2021-09-22 — End: 2021-09-24
  Administered 2021-09-21 – 2021-09-24 (×3): 6 via TOPICAL

## 2021-09-21 MED ORDER — MIRTAZAPINE 15 MG PO TABS
15.0000 mg | ORAL_TABLET | Freq: Every day | ORAL | Status: DC
Start: 2021-09-21 — End: 2021-09-24
  Administered 2021-09-21 – 2021-09-23 (×3): 15 mg via ORAL
  Filled 2021-09-21 (×3): qty 1

## 2021-09-21 MED ORDER — ONDANSETRON HCL 4 MG/2ML IJ SOLN: 4.0000 mg | Freq: Four times a day (QID) | INTRAMUSCULAR | Status: DC | PRN

## 2021-09-21 MED ORDER — INSULIN REGULAR(HUMAN) IN NACL 100-0.9 UT/100ML-% IV SOLN
INTRAVENOUS | Status: DC
Start: 2021-09-21 — End: 2021-09-22
  Administered 2021-09-21: 6.5 [IU]/h via INTRAVENOUS
  Filled 2021-09-21: qty 100

## 2021-09-21 MED ORDER — OLANZAPINE 5 MG PO TABS
7.5000 mg | ORAL_TABLET | Freq: Every day | ORAL | Status: DC
  Administered 2021-09-21 – 2021-09-23 (×3): 7.5 mg via ORAL
  Filled 2021-09-21: qty 2
  Filled 2021-09-21: qty 1
  Filled 2021-09-21: qty 2

## 2021-09-21 MED ORDER — DEXTROSE 50 % IV SOLN: 0.0000 mL | INTRAVENOUS | Status: DC | PRN

## 2021-09-21 MED ORDER — ASPIRIN 81 MG PO TBEC
81.0000 mg | DELAYED_RELEASE_TABLET | Freq: Every day | ORAL | Status: DC
  Administered 2021-09-21 – 2021-09-24 (×4): 81 mg via ORAL
  Filled 2021-09-21 (×4): qty 1

## 2021-09-21 MED ORDER — OXYCODONE HCL 5 MG PO TABS
5.0000 mg | ORAL_TABLET | ORAL | Status: DC | PRN
  Administered 2021-09-23 – 2021-09-24 (×2): 5 mg via ORAL
  Filled 2021-09-21 (×2): qty 1

## 2021-09-21 MED ORDER — ACETAMINOPHEN 325 MG PO TABS: 650.0000 mg | ORAL_TABLET | Freq: Four times a day (QID) | ORAL | Status: DC | PRN

## 2021-09-21 MED ORDER — LACTATED RINGERS IV BOLUS: 20.0000 mL/kg | Freq: Once | INTRAVENOUS | Status: DC

## 2021-09-21 NOTE — ED Notes (Signed)
Date and time results received: 09/21/21 4:37 PM w (use smartphrase ".now" to insert current time)  Test: Lactic Acid, glucose, Potassium Critical Value: 2.7, 879, 6.4  Name of Provider Notified: Dr. Quentin Cornwall  Orders Received? Or Actions Taken?: see chart

## 2021-09-21 NOTE — H&P (Signed)
History and Physical    Brittany [name] UKG:[id number] DOB: 1960/11/23 DOA: 09/21/2021  PCP: [name], NP  Patient coming from: Home   Chief Complaint: Found down at home  HPI: Brittany [name] is a 61 y.o. 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  Apparently the patient was found down at home, unknown time, covered in feces.  Brought to ED as a result.  On presentation in the ED patient with marked hyperglycemia with glucose on BMP greater than 800.  No anion gap on B MP.  Started on insulin gtt. via Endo tool protocol.  Otherwise hemodynamically stable.  Laboratory derangements include hyperkalemia up to 6.4 which was treated with Veltassa by EDP.  ED Course: Patient was given initial fluid bolus, continuous LR infusion, started on IV insulin via Endo tool protocol.  Hyperkalemia treated with insulin and Veltassa.  Hospitalist contacted for admission.  Review of Systems: As per HPI otherwise 14 point review of systems negative.    Past Medical History:  Diagnosis Date   C. difficile colitis 02/17/2021   Colostomy in place [facility])    h/o diverticulitis with abscess   Complicated grief 7/62/8315   COPD, mild ([place]) 09/16/2015   PFTs on 06/15/15 with FEV1/FVC ratio of 64%, FEV1 83%, DLCO 47%   Diabetes mellitus without complication (HCC)    Dyslipidemia    History of Clostridium difficile colitis 03/03/2021   Hyperosmolar hyperglycemic state (HHS) ([place]) 11/18/2020   Hypertension    Hypokalemia; hyperkalemia 07/12/2019   Lactic acidosis 11/19/2020   Lupus (HCC)    Pancreatic cancer (HCC)    RLS (restless legs syndrome)    Seizure disorder ([place])    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 Her smoking use included cigarettes. She has been exposed to tobacco smoke. She has never used smokeless tobacco. She reports current alcohol use. She reports that she does not currently use drugs.  Allergies  Allergen Reactions   Cephalosporins Itching    TOLERATED ZOSYN (PIPERACILLIN) BEFORE   Hydromorphone Itching   Keflin [Cephalothin] Itching   Lactose Intolerance (Gi) Diarrhea    Family History  Problem Relation Age of Onset   Anxiety disorder Sister    Breast cancer Maternal Aunt      Prior to Admission medications   Medication Sig Start Date End Date Taking? Authorizing Provider  aspirin 81 MG EC tablet Take 1 tablet (81 mg total) by mouth daily. Swallow whole. 05/23/21   Pokhrel, Corrie Mckusick, MD  atorvastatin (LIPITOR) 40 MG tablet Take 1 tablet (40 mg total) by mouth Nightly. Patient taking differently: Take 40 mg by mouth at bedtime. 03/14/21 07/26/21  Fritzi Mandes, MD  famotidine (PEPCID) 20 MG tablet Take 1 tablet (20 mg total) by mouth daily. 08/25/21   Fritzi Mandes, MD  feeding supplement, GLUCERNA SHAKE, (GLUCERNA SHAKE) LIQD Take 237 mLs by mouth 3 (three) times daily between meals. 08/24/21  Fritzi Mandes, MD  insulin lispro (HUMALOG) 100 UNIT/ML injection 0-9 units TID with meals, and 0-5 units QHS bases on sliding scale previously prescribed to patient. 08/31/21 08/31/22  Cherene Altes, MD  LANTUS SOLOSTAR 100 UNIT/ML Solostar Pen Inject 8 Units into the skin daily. 08/28/21   Fritzi Mandes, MD  mirtazapine (REMERON) 15 MG tablet Take 15 mg by mouth at bedtime.    [provider]  Multiple Vitamin (MULTIVITAMIN WITH MINERALS) TABS  tablet Take 1 tablet by mouth daily. 08/25/21   Fritzi Mandes, MD  OLANZapine (ZYPREXA) 7.5 MG tablet Take 1 tablet (7.5 mg total) by mouth at bedtime. 06/27/21 07/27/21  Enzo Bi, MD  rOPINIRole (REQUIP) 0.5 MG tablet Take 0.5 mg by mouth at bedtime. 06/29/21   [provider]    Physical Exam: Vitals:   09/21/21 1550 09/21/21 1645  BP: 102/74   Pulse: 91   Resp: 16   Temp: (!) 97.5 F (36.4 C)   TempSrc: Oral   SpO2: 97%   Weight:  45.4 kg     Vitals:   09/21/21 1550 09/21/21 1645  BP: 102/74   Pulse: 91   Resp: 16   Temp: (!) 97.5 F (36.4 C)   TempSrc: Oral   SpO2: 97%   Weight:  45.4 kg   General: NAD.  Frail-appearing.  Answers questions appropriately HEENT: Normocephalic, atraumatic Neck, supple, trachea midline, no tenderness Heart: RRR, no murmurs Lungs: Lungs clear.  Normal work of breathing.  Room air Abdomen: Soft, NT/ND, normal bowel sounds, positive ostomy brown stool Extremities: Normal, atraumatic, no clubbing or cyanosis, normal muscle tone Skin: Dry and pale Neurologic: Cranial nerves grossly intact, sensation intact, alert and oriented x3 Psychiatric: Normal affect   Labs on Admission: I have personally reviewed following labs and imaging studies  CBC: Recent Labs  Lab 09/21/21 1548  WBC 8.3  NEUTROABS 5.8  HGB 14.3  HCT 44.5  MCV 88.5  PLT 353*   Basic Metabolic Panel: Recent Labs  Lab 09/21/21 1548  NA 123*  K 6.4*  CL 90*  CO2 20*  GLUCOSE 879*  BUN 37*  CREATININE 1.26*  CALCIUM 11.0*   GFR: Estimated Creatinine Clearance: 33.6 mL/min (A) (by C-G formula based on SCr of 1.26 mg/dL (H)). Liver Function Tests: Recent Labs  Lab 09/21/21 1548  AST 36  ALT 35  ALKPHOS 182*  BILITOT 0.7  PROT 10.7*  ALBUMIN 4.1   No results for input(s): "LIPASE", "AMYLASE" in the last 168 hours. No results for input(s): "AMMONIA" in the last 168 hours. Coagulation Profile: No results for input(s): "INR", "PROTIME" in the last  168 hours. Cardiac Enzymes: Recent Labs  Lab 09/21/21 1548  CKTOTAL 74   BNP (last 3 results) No results for input(s): "PROBNP" in the last 8760 hours. HbA1C: No results for input(s): "HGBA1C" in the last 72 hours. CBG: Recent Labs  Lab 09/21/21 1539 09/21/21 1656  GLUCAP >600* >600*   Lipid Profile: No results for input(s): "CHOL", "HDL", "LDLCALC", "TRIG", "CHOLHDL", "LDLDIRECT" in the last 72 hours. Thyroid Function Tests: No results for input(s): "TSH", "T4TOTAL", "FREET4", "T3FREE", "THYROIDAB" in the last 72 hours. Anemia Panel: No results for input(s): "VITAMINB12", "FOLATE", "FERRITIN", "TIBC", "IRON", "RETICCTPCT" in the last 72 hours. Urine analysis:    Component Value Date/Time   COLORURINE STRAW (A) 09/06/2021 0645   APPEARANCEUR CLEAR (A) 09/06/2021 0645   APPEARANCEUR Hazy 04/04/2011 1935   LABSPEC 1.017 09/06/2021 0645   LABSPEC >1.060 04/04/2011 1935  PHURINE 5.0 09/06/2021 0645   GLUCOSEU >=500 (A) 09/06/2021 0645   GLUCOSEU 50 mg/dL 04/04/2011 1935   HGBUR NEGATIVE 09/06/2021 0645   BILIRUBINUR NEGATIVE 09/06/2021 0645   BILIRUBINUR Negative 04/04/2011 1935   KETONESUR NEGATIVE 09/06/2021 0645   PROTEINUR NEGATIVE 09/06/2021 0645   NITRITE NEGATIVE 09/06/2021 0645   LEUKOCYTESUR NEGATIVE 09/06/2021 0645   LEUKOCYTESUR Negative 04/04/2011 1935    Radiological Exams on Admission: CT HEAD WO CONTRAST (5MM)  Result Date: 09/21/2021 CLINICAL DATA:  Altered mental status EXAM: CT HEAD WITHOUT CONTRAST TECHNIQUE: Contiguous axial images were obtained from the base of the skull through the vertex without intravenous contrast. RADIATION DOSE REDUCTION: This exam was performed according to the departmental dose-optimization program which includes automated exposure control, adjustment of the mA and/or kV according to patient size and/or use of iterative reconstruction technique. COMPARISON:  09/04/2021 FINDINGS: Brain: No acute intracranial findings are seen.  There are no signs of bleeding within the cranium. There is no focal edema or mass effect. Cortical sulci are prominent. Old infarcts are seen in right frontal lobe, left temporoparietal region and left cerebellum with no significant interval change. Vascular: Unremarkable. Skull: Unremarkable. Sinuses/Orbits: Visualized paranasal sinuses are unremarkable. Lacrimal glands in both orbits appear more prominent than usual. Exophthalmos is seen. Other: No significant interval changes are noted. IMPRESSION: No acute intracranial findings are seen. Old infarcts are seen in both cerebral hemispheres and left cerebellum with no significant interval change. Electronically Signed   By: Elmer Picker M.D.   On: 09/21/2021 17:24   DG Chest Port 1 View  Result Date: 09/21/2021 CLINICAL DATA:  Questionable sepsis - evaluate for abnormality EXAM: PORTABLE CHEST 1 VIEW COMPARISON:  Chest x-ray 09/04/2021. FINDINGS: Persistent streaky bibasilar opacities. No visible pleural effusions or pneumothorax. Similar cardiomediastinal silhouette, which is within normal limits. Polyarticular degenerative change. IMPRESSION: Persistent streaky bibasilar opacities, which could represent atelectasis and/or pneumonia. Electronically Signed   By: Margaretha Sheffield M.D.   On: 09/21/2021 16:22    EKG: Independently reviewed.  Sinus rhythm with mildly peaked T waves  Assessment/Plan Active Problems:   Acute metabolic encephalopathy   Essential hypertension   Uncontrolled type 2 diabetes mellitus with hypoglycemia, with long-term current use of insulin (HCC)   History of stroke   Seizure disorder ([place])   Status post colostomy (HCC)   Hyperkalemia   Hyperosmolar hyperglycemic state (HHS) ([place])  Hyperosmolar hyperglycemic state Type 2 diabetes mellitus, poorly controlled Patient presented with symptoms consistent with HHS.  No obvious acidosis noted.  Mental status appears intact. Plan: Admit to stepdown Insulin  gtt. Aggressive IV fluids Every 4 BMPs DM coordinator consult Monitor in stepdown unit overnight Transfer out if sugars improve  Hyperkalemia Noted with mild peaked T waves on EKG Treated with insulin and Veltassa in ED Plan: Recheck K Every 4 BMP Treat hyperkalemia as necessary  History of CVA PTA aspirin  History of hypertension Hold antihypertensives at this time As needed IV hydralazine  Anxiety Insomnia PTA Remeron and Zyprexa  Poor self-care Adult failure to thrive History of dementia TOC consult  DVT prophylaxis: SQ Lovenox Code Status: Presumed full Family Communication: None Disposition Plan: May need placement due to potential unsafe living situation Consults called: None Admission status: Inpatient stepdown   Sidney Ace MD [facility]   If 7PM-7AM, please contact night-coverage   09/21/2021, 5:46 PM
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
## 2021-09-22 DIAGNOSIS — G9341 Metabolic encephalopathy: Secondary | ICD-10-CM | POA: Diagnosis not present

## 2021-09-22 DIAGNOSIS — E1100 Type 2 diabetes mellitus with hyperosmolarity without nonketotic hyperglycemic-hyperosmolar coma (NKHHC): Secondary | ICD-10-CM | POA: Diagnosis not present

## 2021-09-22 LAB — BASIC METABOLIC PANEL
Anion gap: 5 (ref 5–15)
Anion gap: 4 — ABNORMAL LOW (ref 5–15)
BUN: 23 mg/dL (ref 8–23)
BUN: 21 mg/dL (ref 8–23)
CO2: 22 mmol/L (ref 22–32)
CO2: 24 mmol/L (ref 22–32)
Calcium: 9.9 mg/dL (ref 8.9–10.3)
Calcium: 9.8 mg/dL (ref 8.9–10.3)
Chloride: 108 mmol/L (ref 98–111)
Chloride: 110 mmol/L (ref 98–111)
Creatinine, Ser: 0.58 mg/dL (ref 0.44–1.00)
Creatinine, Ser: 0.55 mg/dL (ref 0.44–1.00)
GFR, Estimated: >60 mL/min (ref >60)
GFR, Estimated: >60 mL/min (ref >60)
Glucose, Bld: 193 mg/dL — ABNORMAL HIGH (ref 70–99)
Glucose, Bld: 152 mg/dL — ABNORMAL HIGH (ref 70–99)
Potassium: 3.5 mmol/L (ref 3.5–5.1)
Potassium: 3.6 mmol/L (ref 3.5–5.1)
Sodium: 135 mmol/L (ref 135–145)
Sodium: 138 mmol/L (ref 135–145)

## 2021-09-22 LAB — GLUCOSE, CAPILLARY
Glucose-Capillary: 142 mg/dL — ABNORMAL HIGH (ref 70–99)
Glucose-Capillary: 147 mg/dL — ABNORMAL HIGH (ref 70–99)
Glucose-Capillary: 148 mg/dL — ABNORMAL HIGH (ref 70–99)
Glucose-Capillary: 149 mg/dL — ABNORMAL HIGH (ref 70–99)
Glucose-Capillary: 159 mg/dL — ABNORMAL HIGH (ref 70–99)
Glucose-Capillary: 162 mg/dL — ABNORMAL HIGH (ref 70–99)
Glucose-Capillary: 163 mg/dL — ABNORMAL HIGH (ref 70–99)
Glucose-Capillary: 178 mg/dL — ABNORMAL HIGH (ref 70–99)
Glucose-Capillary: 199 mg/dL — ABNORMAL HIGH (ref 70–99)
Glucose-Capillary: 202 mg/dL — ABNORMAL HIGH (ref 70–99)
Glucose-Capillary: 304 mg/dL — ABNORMAL HIGH (ref 70–99)
Glucose-Capillary: 358 mg/dL — ABNORMAL HIGH (ref 70–99)

## 2021-09-22 MED ORDER — BOOST / RESOURCE BREEZE PO LIQD CUSTOM
1.0000 | Freq: Three times a day (TID) | ORAL | Status: DC
  Administered 2021-09-22 – 2021-09-24 (×6): 1 via ORAL

## 2021-09-22 MED ORDER — ADULT MULTIVITAMIN W/MINERALS CH
1.0000 | ORAL_TABLET | Freq: Every day | ORAL | Status: DC
  Administered 2021-09-23 – 2021-09-24 (×2): 1 via ORAL
  Filled 2021-09-22 (×2): qty 1

## 2021-09-22 MED ORDER — INSULIN ASPART 100 UNIT/ML IJ SOLN
0.0000 [IU] | Freq: Three times a day (TID) | INTRAMUSCULAR | Status: DC
  Administered 2021-09-22 (×2): 3 [IU] via SUBCUTANEOUS
  Administered 2021-09-22: 2 [IU] via SUBCUTANEOUS
  Administered 2021-09-22 – 2021-09-23 (×2): 15 [IU] via SUBCUTANEOUS
  Administered 2021-09-23: 8 [IU] via SUBCUTANEOUS
  Administered 2021-09-23: 3 [IU] via SUBCUTANEOUS
  Administered 2021-09-24: 8 [IU] via SUBCUTANEOUS
  Administered 2021-09-24 (×2): 2 [IU] via SUBCUTANEOUS
  Filled 2021-09-22 (×10): qty 1

## 2021-09-22 MED ORDER — LACTATED RINGERS IV SOLN
INTRAVENOUS | Status: DC
  Administered 2021-09-22: 100 mL/h via INTRAVENOUS

## 2021-09-22 MED ORDER — INSULIN GLARGINE-YFGN 100 UNIT/ML ~~LOC~~ SOLN
8.0000 [IU] | SUBCUTANEOUS | Status: DC
Start: 2021-09-22 — End: 2021-09-24
  Administered 2021-09-22 – 2021-09-24 (×3): 8 [IU] via SUBCUTANEOUS
  Filled 2021-09-22 (×3): qty 0.08

## 2021-09-22 NOTE — Progress Notes (Signed)
PROGRESS NOTE    Brittany [name]  QAS:[id number] DOB: [birth year] DOA: 09/21/2021 PCP: [name], NP    Brief Narrative:  61 y.o. 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  Apparently the patient was found down at home, unknown time, covered in feces.  Brought to ED as a result.  On presentation in the ED patient with marked hyperglycemia with glucose on BMP greater than 800.  No anion gap on B MP.  Started on insulin gtt. via Endo tool protocol.  Otherwise hemodynamically stable.  Laboratory derangements include hyperkalemia up to 6.4 which was treated with Veltassa by EDP.  HHS resolved.  Hyperkalemia resolved.  Veltassa discontinued.  Insulin drip discontinued.  Started on basal bolus regimen.  Diet advanced to carb modified.   Assessment & Plan:   Active Problems:   Acute metabolic encephalopathy   Essential hypertension   Uncontrolled type 2 diabetes mellitus with hypoglycemia, with long-term current use of insulin (HCC)   History of stroke   Seizure disorder ([place])   Status post colostomy (HCC)   Hyperkalemia   Hyperosmolar hyperglycemic state (HHS) ([place])   Hyperosmolar hyperglycemic state Type 2 diabetes mellitus, poorly controlled Patient presented with symptoms consistent with HHS.  No obvious acidosis noted. Mental status appears intact. HHS resolved Plan: Transfer to [place].  Basal bolus regimen.  Carb modified diet.  Deep coordinator consult.  Monitor sugars carefully.   Hyperkalemia, resolved Noted with mild peaked T waves on EKG Treated with insulin and Veltassa in ED Plan: Potassium back in reference range.  Discontinue Veltassa   History of CVA PTA aspirin   History of hypertension Hold  antihypertensives at this time As needed IV hydralazine   Anxiety Insomnia PTA Remeron and Zyprexa   Poor self-care Adult failure to thrive History of dementia TOC consult  Status post colostomy No acute issues   DVT prophylaxis: SQ Lovenox Code Status: Full Family Communication: None Disposition Plan: Status is: Inpatient Remains inpatient appropriate because: Resolving HHS.  Transferring out of stepdown.  We will request therapy evaluations and TOC consult.   Level of care: Med-Surg  Consultants:  None  Procedures:  None  Antimicrobials: None   Subjective: Seen and examined.  Resting comfortably in bed.  No visible distress.  No pain complaints.  Objective: Vitals:   09/22/21 0600 09/22/21 0700 09/22/21 0800 09/22/21 0900  BP: 92/68 (!) 88/66 110/78 94/68  Pulse: 69 66 72 74  Resp: '12 13 12 18  '$ Temp:   97.7 F (36.5 C)   TempSrc:      SpO2: 100% 100% 100% 100%  Weight:      Height:        Intake/Output Summary (Last 24 hours) at 09/22/2021 1327 Last data filed at 09/22/2021 0200 Gross per 24 hour  Intake --  Output 450 ml  Net -450 ml   Filed Weights   09/21/21 1645 09/21/21 1835  Weight: 45.4 kg 47.1 kg    Examination:  General exam: NAD.  Frail-appearing Respiratory system: Lungs clear.  Normal work of breathing.  Room air Cardiovascular system: Tachycardic, regular rhythm, no murmurs Gastrointestinal system: Thin, soft, NT/ND, normal bowel sounds Central nervous system: Alert, oriented x2, no focal deficits Extremities: Symmetric 5 x 5 power. Skin: No rashes, lesions or ulcers Psychiatry: Judgement  and insight appear impaired. Mood & affect flattened.     Data Reviewed: I have personally reviewed following labs and imaging studies  CBC: Recent Labs  Lab 09/21/21 1548  WBC 8.3  NEUTROABS 5.8  HGB 14.3  HCT 44.5  MCV 88.5  PLT 580*   Basic Metabolic Panel: Recent Labs  Lab 09/21/21 1548 09/21/21 1919 09/21/21 2132  09/22/21 0123 09/22/21 0535  NA 123* 129* 131* 135 138  K 6.4* 4.8 4.6 3.5 3.6  CL 90* 102 105 108 110  CO2 20* 20* 19* 22 24  GLUCOSE 879* 529* 263* 193* 152*  BUN 37* 35* 29* 23 21  CREATININE 1.26* 1.02* 0.81 0.58 0.55  CALCIUM 11.0* 10.2 9.9 9.9 9.8   GFR: Estimated Creatinine Clearance: 53 mL/min (by C-G formula based on SCr of 0.55 mg/dL). Liver Function Tests: Recent Labs  Lab 09/21/21 1548  AST 36  ALT 35  ALKPHOS 182*  BILITOT 0.7  PROT 10.7*  ALBUMIN 4.1   No results for input(s): "LIPASE", "AMYLASE" in the last 168 hours. No results for input(s): "AMMONIA" in the last 168 hours. Coagulation Profile: No results for input(s): "INR", "PROTIME" in the last 168 hours. Cardiac Enzymes: Recent Labs  Lab 09/21/21 1548  CKTOTAL 74   BNP (last 3 results) No results for input(s): "PROBNP" in the last 8760 hours. HbA1C: No results for input(s): "HGBA1C" in the last 72 hours. CBG: Recent Labs  Lab 09/22/21 0259 09/22/21 0502 09/22/21 0647 09/22/21 0741 09/22/21 1127  GLUCAP 147* 162* 149* 142* 199*   Lipid Profile: No results for input(s): "CHOL", "HDL", "LDLCALC", "TRIG", "CHOLHDL", "LDLDIRECT" in the last 72 hours. Thyroid Function Tests: No results for input(s): "TSH", "T4TOTAL", "FREET4", "T3FREE", "THYROIDAB" in the last 72 hours. Anemia Panel: No results for input(s): "VITAMINB12", "FOLATE", "FERRITIN", "TIBC", "IRON", "RETICCTPCT" in the last 72 hours. Sepsis Labs: Recent Labs  Lab 09/21/21 1548 09/21/21 1919  LATICACIDVEN 2.7* 3.3*    Recent Results (from the past 240 hour(s))  MRSA Next Gen by PCR, Nasal     Status: None   Collection Time: 09/21/21  6:41 PM   Specimen: Nasal Mucosa; Nasal Swab  Result Value Ref Range Status   MRSA by PCR Next Gen NOT DETECTED NOT DETECTED Final    Comment: (NOTE) The GeneXpert MRSA Assay (FDA approved for NASAL specimens only), is one component of a comprehensive MRSA colonization surveillance program. It  is not intended to diagnose MRSA infection nor to guide or monitor treatment for MRSA infections. Test performance is not FDA approved in patients less than 32 years old. Performed at [facility], [address], [place], [place] [address]          Radiology Studies: CT HEAD WO CONTRAST (5MM)  Result Date: 09/21/2021 CLINICAL DATA:  Altered mental status EXAM: CT HEAD WITHOUT CONTRAST TECHNIQUE: Contiguous axial images were obtained from the base of the skull through the vertex without intravenous contrast. RADIATION DOSE REDUCTION: This exam was performed according to the departmental dose-optimization program which includes automated exposure control, adjustment of the mA and/or kV according to patient size and/or use of iterative reconstruction technique. COMPARISON:  09/04/2021 FINDINGS: Brain: No acute intracranial findings are seen. There are no signs of bleeding within the cranium. There is no focal edema or mass effect. Cortical sulci are prominent. Old infarcts are seen in right frontal lobe, left temporoparietal region and left cerebellum with no significant interval change. Vascular: Unremarkable. Skull: Unremarkable. Sinuses/Orbits: Visualized paranasal sinuses are unremarkable.  Lacrimal glands in both orbits appear more prominent than usual. Exophthalmos is seen. Other: No significant interval changes are noted. IMPRESSION: No acute intracranial findings are seen. Old infarcts are seen in both cerebral hemispheres and left cerebellum with no significant interval change. Electronically Signed   By: Elmer Picker M.D.   On: 09/21/2021 17:24   DG Chest Port 1 View  Result Date: 09/21/2021 CLINICAL DATA:  Questionable sepsis - evaluate for abnormality EXAM: PORTABLE CHEST 1 VIEW COMPARISON:  Chest x-ray 09/04/2021. FINDINGS: Persistent streaky bibasilar opacities. No visible pleural effusions or pneumothorax. Similar cardiomediastinal silhouette, which is within normal  limits. Polyarticular degenerative change. IMPRESSION: Persistent streaky bibasilar opacities, which could represent atelectasis and/or pneumonia. Electronically Signed   By: Margaretha Sheffield M.D.   On: 09/21/2021 16:22        Scheduled Meds:  aspirin EC  81 mg Oral Daily   Chlorhexidine Gluconate Cloth  6 each Topical Q0600   famotidine  20 mg Oral Daily   feeding supplement  1 Container Oral TID BM   insulin aspart  0-15 Units Subcutaneous TID AC & HS   insulin glargine-yfgn  8 Units Subcutaneous Q24H   mirtazapine  15 mg Oral QHS   [START ON 09/23/2021] multivitamin with minerals  1 tablet Oral Daily   OLANZapine  7.5 mg Oral QHS   rOPINIRole  0.5 mg Oral QHS   Continuous Infusions:  lactated ringers 100 mL/hr (09/22/21 0744)     LOS: 1 day      Sidney Ace, MD [facility]   If 7PM-7AM, please contact night-coverage  09/22/2021, 1:27 PM
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
## 2021-09-22 NOTE — Progress Notes (Signed)
Initial Nutrition Assessment  DOCUMENTATION CODES:   Severe malnutrition in context of chronic illness  INTERVENTION:   Boost Breeze po TID, each supplement provides 250 kcal and 9 grams of protein  MVI po daily   Pt at high refeed risk; recommend monitor potassium, magnesium and phosphorus labs daily until stable  NUTRITION DIAGNOSIS:   Severe Malnutrition related to chronic illness (COPD, pancreatic cancer) as evidenced by severe fat depletion, severe muscle depletion.  GOAL:   Patient will meet greater than or equal to 90% of their needs  MONITOR:   PO intake, Supplement acceptance, Labs, Weight trends, Skin, I & O's, Diet advancement  REASON FOR ASSESSMENT:   Consult Assessment of nutrition requirement/status  ASSESSMENT:   61 y.o. 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  Visited pt's room today. Pt is well known to this RD from numerous previous admissions. Pt is unable to provide any reliable nutrition related history. Pt with poor appetite and oral intake at baseline. Pt does not drink "milky" supplements as she is lactose intolerance. Pt does like Boost Breeze. Per chart, pt appears weight stable at baseline. Pt advanced to CHO modified diet today. RD will add supplements and MVI to help pt meet her estimated needs. Pt is at high refeed risk.   Medications reviewed and include: aspirin, pepcid, insulin, remeron, LRS '@100ml'$ /hr  Labs reviewed: K 3.6 wnl Cbgs-  199, 142, 149, 162, 147 x 24 hrs AIC 15.0(H)- 7/24  NUTRITION - FOCUSED PHYSICAL EXAM:  Flowsheet Row Most Recent Value  Orbital Region Moderate depletion  Upper Arm Region Severe depletion  Thoracic and Lumbar Region Severe depletion  Buccal Region Moderate depletion  Temple Region Severe depletion  Clavicle Bone Region Severe depletion  Clavicle and Acromion Bone Region Severe depletion  Scapular Bone Region Severe depletion  Dorsal Hand Severe depletion  Patellar Region Severe depletion  Anterior Thigh Region Severe depletion  Posterior Calf Region Severe depletion  Edema (RD Assessment) None  Hair Reviewed  Eyes Reviewed  Mouth Reviewed  Skin Reviewed  Nails Reviewed   Diet Order:   Diet Order             Diet clear liquid Room service appropriate? Yes; Fluid consistency: Thin  Diet effective now                  EDUCATION NEEDS:   No education needs have been identified at this time  Skin:  Skin Assessment: Reviewed RN Assessment  Last BM:  8/10- TYPE 7  Height:   Ht Readings from Last 1 Encounters:  09/21/21 5' (1.524 m)    Weight:   Wt Readings from Last 1 Encounters:  09/21/21 47.1 kg    BMI:  Body mass index is 20.28 kg/m.  Estimated Nutritional Needs:   Kcal:  1400-1600kcal/day  Protein:  70-80g/day  Fluid:  1.3-1.5L/day  Koleen Distance MS, RD, LDN Please refer to [facility] for RD and/or RD on-call/weekend/after hours pager

## 2021-09-22 NOTE — Inpatient Diabetes Management (Signed)
Inpatient Diabetes Program Recommendations  AACE/ADA: New Consensus Statement on Inpatient Glycemic Control (2015)  Target Ranges:  Prepandial:   less than 140 mg/dL      Peak postprandial:   less than 180 mg/dL (1-2 hours)      Critically ill patients:  140 - 180 mg/dL    Latest Reference Range & Units 09/21/21 15:48  Sodium 135 - 145 mmol/L 123 (L)  Potassium 3.5 - 5.1 mmol/L 6.4 (HH)  Chloride 98 - 111 mmol/L 90 (L)  CO2 22 - 32 mmol/L 20 (L)  Glucose 70 - 99 mg/dL 879 (HH)  BUN 8 - 23 mg/dL 37 (H)  Creatinine 0.44 - 1.00 mg/dL 1.26 (H)  Calcium 8.9 - 10.3 mg/dL 11.0 (H)  Anion gap 5 - 15  13    Latest Reference Range & Units 11/06/20 04:53 04/06/21 01:12 08/13/21 17:04 09/04/21 13:25  Hemoglobin A1C 4.8 - 5.6 % 15.3 (H) 15.0 (H) >15.5 (H) 15.0 (H)  (H): Data is abnormally high  Latest Reference Range & Units 09/21/21 21:01 09/21/21 21:49 09/21/21 22:56 09/22/21 00:02 09/22/21 01:01 09/22/21 01:59 09/22/21 02:59 09/22/21 05:02 09/22/21 06:47 09/22/21 07:41  Glucose-Capillary 70 - 99 mg/dL 270 (H)  IV Insulin Drip started at 5:30pm 157 (H) 174 (H) 202 (H) 178 (H) 148 (H) 147 (H) 162 (H)  8 units Semglee '@0526'$  149 (H) 142 (H)  2 units Novolog   (H): Data is abnormally high  Admit with: AMS found down at home covered in feces), Hyperosmolar hyperglycemic state, Hyperkalemia  History: Type 1 diabetes, Dementia, CVA, Pancreatic cancer status post pancreatectomy and splenectomy  Home DM Meds: Lantus 8 units Daily       Humalog 0-9 units TID AC + HS per SSI  Current Orders: Semglee 8 units Q24H      Novolog Moderate Correction Scale/ SSI (0-15 units) TID AC + HS         Well known to the Inpatient Diabetes Team Multiple admissions--9th admission since January 2023 Per MD notes, unsafe living situation at home  Pt well known to the diabetes team.  She tends to have extremely labile CBGs in the hospital.  From previous admissions, she will eat in between meals, and when  she does not get Novolog coverage for her food intake, she will spike very high.  Then we have to be cautious not to over-medicate her with too much insulin b/c she has the tendency to bottom out with large doses of Novolog.    Transitioning from IV Insulin to SQ Insulin this AM  Will follow    --Will follow patient during hospitalization--  Wyn Quaker RN, MSN, [place] Diabetes Coordinator Inpatient Glycemic Control Team Team Pager: [phone] (8a-5p)
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
## 2021-09-22 NOTE — Progress Notes (Signed)
[facility]) Hospital Liaison note:  This is a pending outpatient-based Palliative Care patient. Will continue to follow for disposition.  Please call with any outpatient palliative questions or concerns.  Thank you, Lorelee Market, LPN [facility] Liaison [phone]

## 2021-09-23 DIAGNOSIS — L8990 Pressure ulcer of unspecified site, unspecified stage: Secondary | ICD-10-CM

## 2021-09-23 DIAGNOSIS — E1100 Type 2 diabetes mellitus with hyperosmolarity without nonketotic hyperglycemic-hyperosmolar coma (NKHHC): Secondary | ICD-10-CM | POA: Diagnosis not present

## 2021-09-23 LAB — GLUCOSE, CAPILLARY
Glucose-Capillary: 209 mg/dL — ABNORMAL HIGH (ref 70–99)
Glucose-Capillary: 282 mg/dL — ABNORMAL HIGH (ref 70–99)
Glucose-Capillary: 410 mg/dL — ABNORMAL HIGH (ref 70–99)
Glucose-Capillary: 333 mg/dL — ABNORMAL HIGH (ref 70–99)
Glucose-Capillary: 157 mg/dL — ABNORMAL HIGH (ref 70–99)
Glucose-Capillary: 474 mg/dL — ABNORMAL HIGH (ref 70–99)
Glucose-Capillary: 273 mg/dL — ABNORMAL HIGH (ref 70–99)

## 2021-09-23 LAB — PHOSPHORUS: Phosphorus: 3.8 mg/dL (ref 2.5–4.6)

## 2021-09-23 LAB — MAGNESIUM: Magnesium: 1.7 mg/dL (ref 1.7–2.4)

## 2021-09-23 LAB — GLUCOSE, RANDOM: Glucose, Bld: 257 mg/dL — ABNORMAL HIGH (ref 70–99)

## 2021-09-23 MED ORDER — INSULIN ASPART 100 UNIT/ML IJ SOLN
20.0000 [IU] | Freq: Once | INTRAMUSCULAR | Status: AC
Start: 2021-09-23 — End: 2021-09-23
  Administered 2021-09-23: 20 [IU] via SUBCUTANEOUS
  Filled 2021-09-23: qty 1

## 2021-09-23 MED ORDER — INSULIN ASPART 100 UNIT/ML IJ SOLN
5.0000 [IU] | Freq: Once | INTRAMUSCULAR | Status: AC
  Administered 2021-09-23: 5 [IU] via SUBCUTANEOUS
  Filled 2021-09-23: qty 1

## 2021-09-23 MED ORDER — MIRTAZAPINE 15 MG PO TBDP
15.0000 mg | ORAL_TABLET | Freq: Once | ORAL | Status: AC
  Administered 2021-09-23: 15 mg via ORAL
  Filled 2021-09-23: qty 1

## 2021-09-23 MED ORDER — INSULIN ASPART 100 UNIT/ML IJ SOLN
3.0000 [IU] | Freq: Three times a day (TID) | INTRAMUSCULAR | Status: DC
  Administered 2021-09-23 – 2021-09-24 (×5): 3 [IU] via SUBCUTANEOUS
  Filled 2021-09-23 (×5): qty 1

## 2021-09-23 NOTE — Plan of Care (Signed)
  Problem: Nutritional: Goal: Maintenance of adequate nutrition will improve Outcome: Progressing   

## 2021-09-23 NOTE — Inpatient Diabetes Management (Signed)
Inpatient Diabetes Program Recommendations  AACE/ADA: New Consensus Statement on Inpatient Glycemic Control   Target Ranges:  Prepandial:   less than 140 mg/dL      Peak postprandial:   less than 180 mg/dL (1-2 hours)      Critically ill patients:  140 - 180 mg/dL    Latest Reference Range & Units 09/23/21 03:58 09/23/21 07:19  Glucose-Capillary 70 - 99 mg/dL 209 (H) 282 (H)    Latest Reference Range & Units 09/22/21 07:41 09/22/21 11:27 09/22/21 15:56 09/22/21 16:57 09/22/21 20:10 09/22/21 22:43  Glucose-Capillary 70 - 99 mg/dL 142 (H) 199 (H) 358 (H) 304 (H) 159 (H) 163 (H)   Review of Glycemic Control  Diabetes history: DM1 Outpatient Diabetes medications: Lantus 8 units daily, Humalog 0-9 units TID with meals and at HS Current orders for Inpatient glycemic control: Semglee 8 units Q24H, Novolog 0-15 units AC&HS  Inpatient Diabetes Program Recommendations:    Insulin: Please consider ordering Novolog 3 units TID with meals for meal coverage if patient eats at least 50% of meals.  Modena Nunnery, RN, MSN, [place] Diabetes Coordinator Inpatient Diabetes Program [phone] (Team Pager from 8am to [place])

## 2021-09-23 NOTE — Progress Notes (Signed)
NP on duty notified on patient latest blood sugar '474mg'$ dL. Patient given 20 units of Insulin as per order.

## 2021-09-23 NOTE — Progress Notes (Signed)
PROGRESS NOTE    Brittany [name]  TKP:[id number] DOB: Jul 06, 1960 DOA: 09/21/2021 PCP: [name], NP    Brief Narrative:  61 y.o. 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  Apparently the patient was found down at home, unknown time, covered in feces.  Brought to ED as a result.  On presentation in the ED patient with marked hyperglycemia with glucose on BMP greater than 800.  No anion gap on B MP.  Started on insulin gtt. via Endo tool protocol.  Otherwise hemodynamically stable.  Laboratory derangements include hyperkalemia up to 6.4 which was treated with Veltassa by EDP.  HHS resolved.  Hyperkalemia resolved.  Veltassa discontinued.  Insulin drip discontinued.  Started on basal bolus regimen.  Diet advanced to carb modified.  Glycemic control has been difficult to achieve.  Patient highly labile.  Well-known to the inpatient diabetes coordinator service.   Assessment & Plan:   Active Problems:   Acute metabolic encephalopathy   Essential hypertension   Uncontrolled type 2 diabetes mellitus with hypoglycemia, with long-term current use of insulin (HCC)   History of stroke   Seizure disorder ([place])   Status post colostomy (HCC)   Hyperkalemia   Hyperosmolar hyperglycemic state (HHS) (HCC)   Pressure ulcer   Hyperosmolar hyperglycemic state Type 2 diabetes mellitus, poorly controlled Patient presented with symptoms consistent with HHS.  No obvious acidosis noted. Mental status appears intact. HHS resolved Plan: Continue basal bolus regimen.  Titrate to achieve good blood sugar control.  Goal 140-180.  Patient highly labile.  If we are able to optimize anticipate discharge 8/13   Hyperkalemia, resolved Noted with mild  peaked T waves on EKG Treated with insulin and Veltassa in ED Plan: Veltassa discontinued   History of CVA PTA aspirin   History of hypertension Hold antihypertensives at this time As needed IV hydralazine   Anxiety Insomnia PTA Remeron and Zyprexa   Poor self-care Adult failure to thrive History of dementia TOC consult  Status post colostomy No acute issues   DVT prophylaxis: SQ Lovenox Code Status: Full Family Communication: None Disposition Plan: Status is: Inpatient Remains inpatient appropriate because: Resolving HHS.  Transferring out of stepdown.  Anticipate discharge home with home health 8/13   Level of care: Med-Surg  Consultants:  None  Procedures:  None  Antimicrobials: None   Subjective: Examined.  Resting in bed.  Endorses hunger.  A little bit more agitated this morning.  Objective: Vitals:   09/22/21 1659 09/22/21 1906 09/23/21 0359 09/23/21 0812  BP: (!) 124/96 103/79 (!) 104/90 120/88  Pulse: 84 90 79 74  Resp: '16 14 18 19  '$ Temp: (!) 97.4 F (36.3 C) 97.9 F (36.6 C) 97.8 F (36.6 C) (!) 97.4 F (36.3 C)  TempSrc:      SpO2: 100% 100% 100% 100%  Weight:      Height:        Intake/Output Summary (Last 24 hours) at 09/23/2021 1401 Last data filed at 09/23/2021 1200 Gross per 24 hour  Intake 1717 ml  Output --  Net 1717 ml   Filed Weights   09/21/21 1645 09/21/21 1835  Weight: 45.4 kg 47.1 kg    Examination:  General exam: NAD.  Appears frail Respiratory system: Lungs clear.  Normal work of breathing.  Room air Cardiovascular system: S1-S2, RRR, no murmurs, no pedal edema Gastrointestinal system: Thin, soft, NT/ND, normal bowel sounds Central nervous system: Alert, oriented x2, no focal deficits Extremities: Symmetric 5 x 5 power. Skin: No rashes, lesions or ulcers Psychiatry: Judgement and insight appear impaired. Mood & affect flattened.     Data Reviewed: I have personally reviewed following labs and imaging  studies  CBC: Recent Labs  Lab 09/21/21 1548  WBC 8.3  NEUTROABS 5.8  HGB 14.3  HCT 44.5  MCV 88.5  PLT 967*   Basic Metabolic Panel: Recent Labs  Lab 09/21/21 1548 09/21/21 1919 09/21/21 2132 09/22/21 0123 09/22/21 0535 09/23/21 0521  NA 123* 129* 131* 135 138  --   K 6.4* 4.8 4.6 3.5 3.6  --   CL 90* 102 105 108 110  --   CO2 20* 20* 19* 22 24  --   GLUCOSE 879* 529* 263* 193* 152*  --   BUN 37* 35* 29* 23 21  --   CREATININE 1.26* 1.02* 0.81 0.58 0.55  --   CALCIUM 11.0* 10.2 9.9 9.9 9.8  --   MG  --   --   --   --   --  1.7  PHOS  --   --   --   --   --  3.8   GFR: Estimated Creatinine Clearance: 53 mL/min (by C-G formula based on SCr of 0.55 mg/dL). Liver Function Tests: Recent Labs  Lab 09/21/21 1548  AST 36  ALT 35  ALKPHOS 182*  BILITOT 0.7  PROT 10.7*  ALBUMIN 4.1   No results for input(s): "LIPASE", "AMYLASE" in the last 168 hours. No results for input(s): "AMMONIA" in the last 168 hours. Coagulation Profile: No results for input(s): "INR", "PROTIME" in the last 168 hours. Cardiac Enzymes: Recent Labs  Lab 09/21/21 1548  CKTOTAL 74   BNP (last 3 results) No results for input(s): "PROBNP" in the last 8760 hours. HbA1C: No results for input(s): "HGBA1C" in the last 72 hours. CBG: Recent Labs  Lab 09/22/21 2243 09/23/21 0358 09/23/21 0719 09/23/21 1139 09/23/21 1354  GLUCAP 163* 209* 282* 410* 333*   Lipid Profile: No results for input(s): "CHOL", "HDL", "LDLCALC", "TRIG", "CHOLHDL", "LDLDIRECT" in the last 72 hours. Thyroid Function Tests: No results for input(s): "TSH", "T4TOTAL", "FREET4", "T3FREE", "THYROIDAB" in the last 72 hours. Anemia Panel: No results for input(s): "VITAMINB12", "FOLATE", "FERRITIN", "TIBC", "IRON", "RETICCTPCT" in the last 72 hours. Sepsis Labs: Recent Labs  Lab 09/21/21 1548 09/21/21 1919  LATICACIDVEN 2.7* 3.3*    Recent Results (from the past 240 hour(s))  MRSA Next Gen by PCR, Nasal     Status:  None   Collection Time: 09/21/21  6:41 PM   Specimen: Nasal Mucosa; Nasal Swab  Result Value Ref Range Status   MRSA by PCR Next Gen NOT DETECTED NOT DETECTED Final    Comment: (NOTE) The GeneXpert MRSA Assay (FDA approved for NASAL specimens only), is one component of a comprehensive MRSA colonization surveillance program. It is not intended to diagnose MRSA infection nor to guide or monitor treatment for MRSA infections. Test performance is not FDA approved in patients less than 71 years old. Performed at [facility], [address] [place], [place] [address]          Radiology Studies: CT HEAD WO CONTRAST (5MM)  Result Date: 09/21/2021 CLINICAL DATA:  Altered mental status EXAM: CT HEAD WITHOUT CONTRAST TECHNIQUE: Contiguous axial images were obtained from the base  of the skull through the vertex without intravenous contrast. RADIATION DOSE REDUCTION: This exam was performed according to the departmental dose-optimization program which includes automated exposure control, adjustment of the mA and/or kV according to patient size and/or use of iterative reconstruction technique. COMPARISON:  09/04/2021 FINDINGS: Brain: No acute intracranial findings are seen. There are no signs of bleeding within the cranium. There is no focal edema or mass effect. Cortical sulci are prominent. Old infarcts are seen in right frontal lobe, left temporoparietal region and left cerebellum with no significant interval change. Vascular: Unremarkable. Skull: Unremarkable. Sinuses/Orbits: Visualized paranasal sinuses are unremarkable. Lacrimal glands in both orbits appear more prominent than usual. Exophthalmos is seen. Other: No significant interval changes are noted. IMPRESSION: No acute intracranial findings are seen. Old infarcts are seen in both cerebral hemispheres and left cerebellum with no significant interval change. Electronically Signed   By: Elmer Picker M.D.   On: 09/21/2021 17:24    DG Chest Port 1 View  Result Date: 09/21/2021 CLINICAL DATA:  Questionable sepsis - evaluate for abnormality EXAM: PORTABLE CHEST 1 VIEW COMPARISON:  Chest x-ray 09/04/2021. FINDINGS: Persistent streaky bibasilar opacities. No visible pleural effusions or pneumothorax. Similar cardiomediastinal silhouette, which is within normal limits. Polyarticular degenerative change. IMPRESSION: Persistent streaky bibasilar opacities, which could represent atelectasis and/or pneumonia. Electronically Signed   By: Margaretha Sheffield M.D.   On: 09/21/2021 16:22        Scheduled Meds:  aspirin EC  81 mg Oral Daily   Chlorhexidine Gluconate Cloth  6 each Topical Q0600   famotidine  20 mg Oral Daily   feeding supplement  1 Container Oral TID BM   insulin aspart  0-15 Units Subcutaneous TID AC & HS   insulin aspart  3 Units Subcutaneous TID WC   insulin glargine-yfgn  8 Units Subcutaneous Q24H   mirtazapine  15 mg Oral QHS   multivitamin with minerals  1 tablet Oral Daily   OLANZapine  7.5 mg Oral QHS   rOPINIRole  0.5 mg Oral QHS   Continuous Infusions:     LOS: 2 days      Sidney Ace, MD [facility]   If 7PM-7AM, please contact night-coverage  09/23/2021, 2:01 PM

## 2021-09-24 DIAGNOSIS — E1100 Type 2 diabetes mellitus with hyperosmolarity without nonketotic hyperglycemic-hyperosmolar coma (NKHHC): Secondary | ICD-10-CM | POA: Diagnosis not present

## 2021-09-24 LAB — GLUCOSE, CAPILLARY
Glucose-Capillary: 89 mg/dL (ref 70–99)
Glucose-Capillary: 105 mg/dL — ABNORMAL HIGH (ref 70–99)
Glucose-Capillary: 128 mg/dL — ABNORMAL HIGH (ref 70–99)
Glucose-Capillary: 269 mg/dL — ABNORMAL HIGH (ref 70–99)
Glucose-Capillary: 127 mg/dL — ABNORMAL HIGH (ref 70–99)

## 2021-09-24 NOTE — TOC Progression Note (Signed)
Transition of Care [facility]) - Progression Note    Patient Details  Name: Brittany [name] MRN: [id number] Date of Birth: 22-Mar-1960  Transition of [facility]) CM/SW [place], RN Phone Number: 09/24/2021, 11:03 AM  Clinical Narrative:    Called the patient's daughter Brittany [name] both at her home number and cell, no answer, left a Voice Mail for a return call, she lives with the patient and is her caregiver,    Expected Discharge Plan: [place] Barriers to Discharge: Continued Medical Work up  Expected Discharge Plan and Services Expected Discharge Plan: [place] arrangements for the past 2 months: Single Family Home Expected Discharge Date: 09/24/21                                     Social Determinants of Health (SDOH) Interventions    Readmission Risk Interventions    09/22/2021    1:24 PM 03/06/2021    9:13 AM 02/16/2021    2:45 PM  Readmission Risk Prevention Plan  Transportation Screening Complete Complete Complete  Medication Review ([place]) Complete Complete Complete  PCP or Specialist appointment within 3-5 days of discharge Complete Complete   HRI or Home Care Consult Complete    SW Recovery Care/Counseling Consult Complete  Complete  Palliative Care Screening Not Applicable Not Applicable Not [place] Complete Not Applicable Not Applicable

## 2021-09-24 NOTE — TOC Progression Note (Signed)
Transition of Care [facility]) - Progression Note    Patient Details  Name: KIANDRA SANGUINETTI MRN: [id number] Date of Birth: October 25, 1960  Transition of Care [facility]) CM/SW [place], RN Phone Number: 09/24/2021, 8:18 AM  Clinical Narrative:    Per previous note the daughter Doroteo Bradford will arrange transportation for the patient home this weekend, I attempted to reach her at  [phone] and was able to reach her at [phone], she stated that she will try to arrange transportation, I explained that she will be discharged after breakfast she stated understanding and said that she will arrange transport and call the nurse to notify when it is done  Expected Discharge Plan: [place] Barriers to Discharge: Continued Medical Work up  Expected Discharge Plan and Services Expected Discharge Plan: [place] arrangements for the past 2 months: Single Family Home Expected Discharge Date: 09/24/21                                     Social Determinants of Health (SDOH) Interventions    Readmission Risk Interventions    09/22/2021    1:24 PM 03/06/2021    9:13 AM 02/16/2021    2:45 PM  Readmission Risk Prevention Plan  Transportation Screening Complete Complete Complete  Medication Review (RN Care Manager) Complete Complete Complete  PCP or Specialist appointment within 3-5 days of discharge Complete Complete   HRI or Home Care Consult Complete    SW Recovery Care/Counseling Consult Complete  Complete  Palliative Care Screening Not Applicable Not Applicable Not [place] Complete Not Applicable Not Applicable

## 2021-09-24 NOTE — Progress Notes (Signed)
Patient daughter called nurse station stating "she didn't have a ride, reason why she wouldn't be able to pick up the patient." Case manager notified.

## 2021-09-24 NOTE — Discharge Summary (Signed)
Physician Discharge Summary  Brittany [name] WUJ:[id number] DOB: February 03, 1961 DOA: 09/21/2021  PCP: [name], NP  Admit date: 09/21/2021 Discharge date: 09/24/2021  Admitted From: Home Disposition: Home  Recommendations for Outpatient Follow-up:  Follow up with PCP in 1-2 weeks Follow-up with endocrinology  Home Health: Yes PT OT aide Equipment/Devices: None  Discharge Condition: Stable CODE STATUS: Full Diet recommendation: Carb modified  Brief/Interim Summary:  61 y.o. 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  Apparently the patient was found down at home, unknown time, covered in feces.  Brought to ED as a result.  On presentation in the ED patient with marked hyperglycemia with glucose on BMP greater than 800.  No anion gap on B MP.  Started on insulin gtt. via Endo tool protocol.  Otherwise hemodynamically stable.  Laboratory derangements include hyperkalemia up to 6.4 which was treated with Veltassa by EDP.   HHS resolved.  Hyperkalemia resolved.  Veltassa discontinued.  Insulin drip discontinued.  Started on basal bolus regimen.  Diet advanced to carb modified.   Glycemic control has been difficult to achieve.  Patient highly labile.  Well-known to the inpatient diabetes coordinator service.  On day of discharge glycemic control improved.  Patient's glycemic regimen is appropriate to achieve good control however patient has not been adherent to dietary restrictions.  This is likely the etiology for the markedly elevated hemoglobin A1c, labile blood sugars, recurrent readmissions.  Patient educated extensively.  However she will remain a high risk for hospital readmission.   Discharge Diagnoses:  Active Problems:   Acute  metabolic encephalopathy   Essential hypertension   Uncontrolled type 2 diabetes mellitus with hypoglycemia, with long-term current use of insulin (HCC)   History of stroke   Seizure disorder ([place])   Status post colostomy (HCC)   Hyperkalemia   Hyperosmolar hyperglycemic state (HHS) (HCC)   Pressure ulcer  Hyperosmolar hyperglycemic state Type 2 diabetes mellitus, poorly controlled Patient presented with symptoms consistent with HHS.  No obvious acidosis noted. Mental status appears intact. HHS resolved Plan: Patient's home glycemic regimen as appropriate to achieve good glycemic control if the patient is able to regulate her diet.  Patient's main reasoning for repeat readmissions and hyperglycemia/DKA/HHS is dietary indiscretion.  She is educated extensively.  Dietary control will be an issue and the patient will remain high risk for hospital readmission.   Hyperkalemia, resolved Noted with mild peaked T waves on EKG Treated with insulin and Veltassa in ED Plan: Veltassa discontinued  Discharge Instructions  Discharge Instructions     Diet - low sodium heart healthy   Complete by: As directed    Increase activity slowly   Complete by: As directed    No wound care   Complete by: As directed       Allergies as of 09/24/2021       Reactions   Cephalosporins Itching   TOLERATED ZOSYN (PIPERACILLIN) BEFORE   Hydromorphone Itching   Keflin [cephalothin] Itching   Lactose Intolerance (gi) Diarrhea        Medication List     STOP taking these medications    QUEtiapine 25 MG tablet Commonly known as: SEROQUEL   QUEtiapine 50 MG tablet Commonly known as: SEROQUEL       TAKE these medications  aspirin EC 81 MG tablet Take 1 tablet (81 mg total) by mouth daily. Swallow whole.   atorvastatin 40 MG tablet Commonly known as: LIPITOR Take 1 tablet (40 mg total) by mouth Nightly. What changed: when to take this   famotidine 20 MG tablet Commonly known as:  PEPCID Take 1 tablet (20 mg total) by mouth daily.   feeding supplement (GLUCERNA SHAKE) Liqd Take 237 mLs by mouth 3 (three) times daily between meals.   insulin lispro 100 UNIT/ML injection Commonly known as: HumaLOG 0-9 units TID with meals, and 0-5 units QHS bases on sliding scale previously prescribed to patient.   Lantus SoloStar 100 UNIT/ML Solostar Pen Generic drug: insulin glargine Inject 8 Units into the skin daily.   mirtazapine 15 MG tablet Commonly known as: REMERON Take 15 mg by mouth at bedtime.   multivitamin with minerals Tabs tablet Take 1 tablet by mouth daily.   OLANZapine 7.5 MG tablet Commonly known as: ZYPREXA Take 1 tablet (7.5 mg total) by mouth at bedtime.   rOPINIRole 0.5 MG tablet Commonly known as: REQUIP Take 0.5 mg by mouth at bedtime.        Allergies  Allergen Reactions   Cephalosporins Itching    TOLERATED ZOSYN (PIPERACILLIN) BEFORE   Hydromorphone Itching   Keflin [Cephalothin] Itching   Lactose Intolerance (Gi) Diarrhea    Consultations: None   Procedures/Studies: CT HEAD WO CONTRAST (5MM)  Result Date: 09/21/2021 CLINICAL DATA:  Altered mental status EXAM: CT HEAD WITHOUT CONTRAST TECHNIQUE: Contiguous axial images were obtained from the base of the skull through the vertex without intravenous contrast. RADIATION DOSE REDUCTION: This exam was performed according to the departmental dose-optimization program which includes automated exposure control, adjustment of the mA and/or kV according to patient size and/or use of iterative reconstruction technique. COMPARISON:  09/04/2021 FINDINGS: Brain: No acute intracranial findings are seen. There are no signs of bleeding within the cranium. There is no focal edema or mass effect. Cortical sulci are prominent. Old infarcts are seen in right frontal lobe, left temporoparietal region and left cerebellum with no significant interval change. Vascular: Unremarkable. Skull: Unremarkable.  Sinuses/Orbits: Visualized paranasal sinuses are unremarkable. Lacrimal glands in both orbits appear more prominent than usual. Exophthalmos is seen. Other: No significant interval changes are noted. IMPRESSION: No acute intracranial findings are seen. Old infarcts are seen in both cerebral hemispheres and left cerebellum with no significant interval change. Electronically Signed   By: Elmer Picker M.D.   On: 09/21/2021 17:24   DG Chest Port 1 View  Result Date: 09/21/2021 CLINICAL DATA:  Questionable sepsis - evaluate for abnormality EXAM: PORTABLE CHEST 1 VIEW COMPARISON:  Chest x-ray 09/04/2021. FINDINGS: Persistent streaky bibasilar opacities. No visible pleural effusions or pneumothorax. Similar cardiomediastinal silhouette, which is within normal limits. Polyarticular degenerative change. IMPRESSION: Persistent streaky bibasilar opacities, which could represent atelectasis and/or pneumonia. Electronically Signed   By: Margaretha Sheffield M.D.   On: 09/21/2021 16:22   CT ABDOMEN PELVIS W CONTRAST  Result Date: 09/04/2021 CLINICAL DATA:  61 year old female with history of prior pancreatectomy and splenectomy presenting with altered mental status. EXAM: CT ABDOMEN AND PELVIS WITH CONTRAST TECHNIQUE: Multidetector CT imaging of the abdomen and pelvis was performed using the standard protocol following bolus administration of intravenous contrast. RADIATION DOSE REDUCTION: This exam was performed according to the departmental dose-optimization program which includes automated exposure control, adjustment of the mA and/or kV according to patient size and/or use of iterative reconstruction technique. CONTRAST:  147m OMNIPAQUE  IOHEXOL 300 MG/ML  SOLN COMPARISON:  April 28, 2021 FINDINGS: Lower chest: Ground-glass and nodular opacities at the lung bases worse in the medial lung bases bilaterally similar to previous imaging from July 26, 2021. Hepatobiliary: Mild hepatic steatosis, no focal suspicious  hepatic lesion. Surgical changes in the area the porta hepatis as before. Portal vein is patent. No pericholecystic stranding. Mild dilation of the common bile duct but without intrahepatic biliary duct dilation. Mass effect upon the distal common bile duct in the setting of multi cystic masslike enlargement of the uncinate process and head of the pancreas. Pancreas: Uncinate process and head of the pancreas measuring 4.9 x 2.8 cm. Surrounding stranding seen on previous imaging has resolved. When compared to the study of March 09, 2021 pancreatic enlargement is similar. Spleen: Surgically absent with surgical clips about the LEFT upper quadrant and distal pancreatic bed following distal pancreatectomy as well. Adrenals/Urinary Tract: Normal adrenal glands. No hydronephrosis. No perinephric stranding. Urinary bladder is moderately distended without surrounding stranding. Stomach/Bowel: Signs of partial colonic resection with ostomy in the LEFT lower quadrant. No signs of stranding adjacent to the stomach stomach is moderately distended with ingested contents. No sign of small bowel dilation or small bowel inflammation. No secondary signs of inflammation adjacent to the cecum. Appendix not visible. Vascular/Lymphatic: No signs of aortic dilation. Atherosclerotic changes both calcified and noncalcified in the infrarenal abdominal aorta. IVC is compressed secondary to mass effect from the enlarged pancreatic head and uncinate process. Reproductive: Post tubal ligation. Grossly normal appearance of reproductive structures on CT. Other: No ascites. Musculoskeletal: No acute bone finding. No destructive bone process. Spinal degenerative changes. IMPRESSION: 1. Cystic masslike enlargement of the pancreatic head and uncinate process with mass effect upon adjacent biliary tree is similar to prior imaging and not associated with signs of active inflammation or frank biliary duct obstruction. 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 2. Findings of suspected basilar pneumonia/aspiration are similar to previous imaging in terms of distribution perhaps minimally improved but with limited assessment due to respiratory motion at the lung bases. 3. Post splenectomy and distal pancreatectomy as before. 4. Post partial colonic resection with LEFT lower quadrant colostomy. Aortic Atherosclerosis (ICD10-I70.0). Electronically Signed   By: Zetta Bills M.D.   On: 09/04/2021 16:39   DG Chest Portable 1 View  Result Date: 09/04/2021 CLINICAL DATA:  Weakness, erratic  behavior EXAM: PORTABLE CHEST 1 VIEW COMPARISON:  08/13/2021 FINDINGS: Low lung volumes. Mild bilateral interstitial thickening partially accentuated by the low lung volumes. Persistent bibasilar airspace disease which may reflect atelectasis versus pneumonia. No focal consolidation. No pleural effusion or pneumothorax. Heart and mediastinal contours are unremarkable. No acute osseous abnormality. IMPRESSION: 1. Persistent bibasilar airspace disease which may reflect atelectasis versus pneumonia. Electronically Signed   By: Kathreen Devoid M.D.   On: 09/04/2021 14:34   CT HEAD WO CONTRAST (5MM)  Result Date: 09/04/2021 CLINICAL DATA:  There attic behavior EXAM: CT HEAD WITHOUT CONTRAST TECHNIQUE: Contiguous axial images were obtained from the base of the skull through the vertex without intravenous contrast. RADIATION DOSE REDUCTION: This exam was performed according to the departmental dose-optimization program which includes automated exposure control, adjustment of the mA and/or kV according to patient size and/or use of iterative reconstruction technique. COMPARISON:  08/13/2021 FINDINGS: Brain: No change or acute finding. Old infarction of the left cerebellum. Old infarction of the left posteromedial temporal lobe and occipital lobe. Old right frontal cortical and subcortical infarction. Chronic small-vessel ischemic  changes elsewhere  affecting the white matter. No mass lesion, hemorrhage, hydrocephalus or extra-axial collection. Vascular: There is atherosclerotic calcification of the major vessels at the base of the brain. Skull: Negative Sinuses/Orbits: Clear/normal Other: None IMPRESSION: No acute CT finding. Old infarctions of the left cerebellum, left PCA territory and right frontal lobe. Chronic small-vessel ischemic changes throughout the brain. No change since the study of 3 weeks ago. Electronically Signed   By: Nelson Chimes M.D.   On: 09/04/2021 14:04      Subjective: Seen and examined on the day of discharge.  Stable no distress.  Glycemic control improved.  Stable for discharge home.  Discharge Exam: Vitals:   09/23/21 1611 09/24/21 0733  BP:  100/65  Pulse:  83  Resp:  16  Temp: 98.3 F (36.8 C) 97.6 F (36.4 C)  SpO2:     Vitals:   09/23/21 0812 09/23/21 1606 09/23/21 1611 09/24/21 0733  BP: 120/88 114/84  100/65  Pulse: 74 (!) 104  83  Resp: '19 17  16  '$ Temp: (!) 97.4 F (36.3 C) 99 F (37.2 C) 98.3 F (36.8 C) 97.6 F (36.4 C)  TempSrc:   Axillary Oral  SpO2: 100% 100%    Weight:      Height:        General: Pt is alert, awake, not in acute distress Cardiovascular: RRR, S1/S2 +, no rubs, no gallops Respiratory: CTA bilaterally, no wheezing, no rhonchi Abdominal: Soft, NT, ND, bowel sounds + Extremities: no edema, no cyanosis    The results of significant diagnostics from this hospitalization (including imaging, microbiology, ancillary and laboratory) are listed below for reference.     Microbiology: Recent Results (from the past 240 hour(s))  MRSA Next Gen by PCR, Nasal     Status: None   Collection Time: 09/21/21  6:41 PM   Specimen: Nasal Mucosa; Nasal Swab  Result Value Ref Range Status   MRSA by PCR Next Gen NOT DETECTED NOT DETECTED Final    Comment: (NOTE) The GeneXpert MRSA Assay (FDA approved for NASAL specimens only), is one component of a comprehensive MRSA  colonization surveillance program. It is not intended to diagnose MRSA infection nor to guide or monitor treatment for MRSA infections. Test performance is not FDA approved in patients less than 73 years old. Performed at [facility], [address] [place], [place] [address]      Labs: BNP (last 3 results) No results for input(s): "BNP" in the last 8760 hours. Basic Metabolic Panel: Recent Labs  Lab 09/21/21 1548 09/21/21 1919 09/21/21 2132 09/22/21 0123 09/22/21 0535 09/23/21 0521 09/23/21 2304  NA 123* 129* 131* 135 138  --   --   K 6.4* 4.8 4.6 3.5 3.6  --   --   CL 90* 102 105 108 110  --   --   CO2 20* 20* 19* 22 24  --   --   GLUCOSE 879* 529* 263* 193* 152*  --  257*  BUN 37* 35* 29* 23 21  --   --   CREATININE 1.26* 1.02* 0.81 0.58 0.55  --   --   CALCIUM 11.0* 10.2 9.9 9.9 9.8  --   --   MG  --   --   --   --   --  1.7  --   PHOS  --   --   --   --   --  3.8  --    Liver Function Tests: Recent  Labs  Lab 09/21/21 1548  AST 36  ALT 35  ALKPHOS 182*  BILITOT 0.7  PROT 10.7*  ALBUMIN 4.1   No results for input(s): "LIPASE", "AMYLASE" in the last 168 hours. No results for input(s): "AMMONIA" in the last 168 hours. CBC: Recent Labs  Lab 09/21/21 1548  WBC 8.3  NEUTROABS 5.8  HGB 14.3  HCT 44.5  MCV 88.5  PLT 512*   Cardiac Enzymes: Recent Labs  Lab 09/21/21 1548  CKTOTAL 74   BNP: Invalid input(s): "POCBNP" CBG: Recent Labs  Lab 09/23/21 2052 09/23/21 2317 09/24/21 0208 09/24/21 0544 09/24/21 0714  GLUCAP 474* 273* 89 105* 128*   D-Dimer No results for input(s): "DDIMER" in the last 72 hours. Hgb A1c No results for input(s): "HGBA1C" in the last 72 hours. Lipid Profile No results for input(s): "CHOL", "HDL", "LDLCALC", "TRIG", "CHOLHDL", "LDLDIRECT" in the last 72 hours. Thyroid function studies No results for input(s): "TSH", "T4TOTAL", "T3FREE", "THYROIDAB" in the last 72 hours.  Invalid input(s): "FREET3" Anemia  work up No results for input(s): "VITAMINB12", "FOLATE", "FERRITIN", "TIBC", "IRON", "RETICCTPCT" in the last 72 hours. Urinalysis    Component Value Date/Time   COLORURINE STRAW (A) 09/06/2021 0645   APPEARANCEUR CLEAR (A) 09/06/2021 0645   APPEARANCEUR Hazy 04/04/2011 1935   LABSPEC 1.017 09/06/2021 0645   LABSPEC >1.060 04/04/2011 1935   PHURINE 5.0 09/06/2021 0645   GLUCOSEU >=500 (A) 09/06/2021 0645   GLUCOSEU 50 mg/dL 04/04/2011 1935   HGBUR NEGATIVE 09/06/2021 0645   BILIRUBINUR NEGATIVE 09/06/2021 0645   BILIRUBINUR Negative 04/04/2011 1935   KETONESUR NEGATIVE 09/06/2021 0645   PROTEINUR NEGATIVE 09/06/2021 0645   NITRITE NEGATIVE 09/06/2021 0645   LEUKOCYTESUR NEGATIVE 09/06/2021 0645   LEUKOCYTESUR Negative 04/04/2011 1935   Sepsis Labs Recent Labs  Lab 09/21/21 1548  WBC 8.3   Microbiology Recent Results (from the past 240 hour(s))  MRSA Next Gen by PCR, Nasal     Status: None   Collection Time: 09/21/21  6:41 PM   Specimen: Nasal Mucosa; Nasal Swab  Result Value Ref Range Status   MRSA by PCR Next Gen NOT DETECTED NOT DETECTED Final    Comment: (NOTE) The GeneXpert MRSA Assay (FDA approved for NASAL specimens only), is one component of a comprehensive MRSA colonization surveillance program. It is not intended to diagnose MRSA infection nor to guide or monitor treatment for MRSA infections. Test performance is not FDA approved in patients less than 12 years old. Performed at [facility], [address] [place], [name] [address]      Time coordinating discharge: Over 30 minutes  SIGNED:   Sidney Ace, MD  [facility] 09/24/2021, 10:33 AM Pager   If 7PM-7AM, please contact night-coverage

## 2021-09-24 NOTE — Progress Notes (Signed)
Spoke to patient daughter Doroteo Bradford and she stated "She is at home, and will be able to received patient via EMS."

## 2021-09-24 NOTE — Plan of Care (Signed)
  Problem: Nutritional: Goal: Maintenance of adequate nutrition will improve Outcome: Progressing   Problem: Skin Integrity: Goal: Risk for impaired skin integrity will decrease Outcome: Progressing   

## 2021-09-24 NOTE — TOC Progression Note (Signed)
Transition of Care [facility]) - Progression Note    Patient Details  Name: Brittany [name] MRN: [id number] Date of Birth: 11/16/1960  Transition of Care [facility]) CM/SW [place], RN Phone Number: 09/24/2021, 12:57 PM  Clinical Narrative:     Attempted to call Daughter Doroteo Bradford to get transportation home, unable to reach, Left another VM, Unable to set up EMS transport due to not being able to confirm someone is home to accept the patient, patient is not appropriate for [facility] or [facility]  Expected Discharge Plan: [place] Barriers to Discharge: Continued Medical Work up  Expected Discharge Plan and Services Expected Discharge Plan: [employer] arrangements for the past 2 months: Single Family Home Expected Discharge Date: 09/24/21                                     Social Determinants of Health (SDOH) Interventions    Readmission Risk Interventions    09/22/2021    1:24 PM 03/06/2021    9:13 AM 02/16/2021    2:45 PM  Readmission Risk Prevention Plan  Transportation Screening Complete Complete Complete  Medication Review Press photographer) Complete Complete Complete  PCP or Specialist appointment within 3-5 days of discharge Complete Complete   HRI or Home Care Consult Complete    SW Recovery Care/Counseling Consult Complete  Complete  Palliative Care Screening Not Applicable Not Applicable Not [place] Complete Not Applicable Not Applicable

## 2021-09-24 NOTE — Progress Notes (Signed)
IV removed, paperwork in package. Patient change to discharge gown. EMS called.

## 2021-09-24 NOTE — Progress Notes (Signed)
[employer] - patient with cbg 300-200s, 474 HS covered with 20 units novolog.  Patient is also been demonstrating obsessive behaviors with eating all night long for several nights and when nurses try to place limits on snacks she becomes very agitated  , argumentative and wanders looking for food.  Sitter already at bedside for safety  Action - additional additional remeron 15 mg disintegrating tablet ordered. Advised nurses to allow her snacks such as sugar free popsicles/italian ice  Response - will need follow up Recommend psych consult

## 2021-09-24 NOTE — Plan of Care (Signed)
  Problem: Nutritional: Goal: Maintenance of adequate nutrition will improve Outcome: Progressing   Problem: Clinical Measurements: Goal: Respiratory complications will improve Outcome: Progressing Goal: Cardiovascular complication will be avoided Outcome: Progressing   Problem: Safety: Goal: Ability to remain free from injury will improve Outcome: Progressing

## 2021-09-25 ENCOUNTER — Inpatient Hospital Stay
Admission: EM | Admit: 2021-09-25 | Discharge: 2021-10-19 | DRG: 637 | Disposition: A | Discharge status: Home Health | Payer: Medicare Other | Attending: Internal Medicine | Admitting: Internal Medicine

## 2021-09-25 ENCOUNTER — Encounter: Payer: Self-pay | Admitting: Emergency Medicine

## 2021-09-25 ENCOUNTER — Other Ambulatory Visit: Payer: Self-pay

## 2021-09-25 DIAGNOSIS — G4700 Insomnia, unspecified: Secondary | ICD-10-CM | POA: Diagnosis present

## 2021-09-25 DIAGNOSIS — M329 Systemic lupus erythematosus, unspecified: Secondary | ICD-10-CM | POA: Diagnosis present

## 2021-09-25 DIAGNOSIS — Z5900 Homelessness unspecified: Secondary | ICD-10-CM

## 2021-09-25 DIAGNOSIS — I10 Essential (primary) hypertension: Secondary | ICD-10-CM | POA: Diagnosis present

## 2021-09-25 DIAGNOSIS — F32A Depression, unspecified: Secondary | ICD-10-CM | POA: Diagnosis present

## 2021-09-25 DIAGNOSIS — E440 Moderate protein-calorie malnutrition: Secondary | ICD-10-CM | POA: Diagnosis present

## 2021-09-25 DIAGNOSIS — Z6820 Body mass index (BMI) 20.0-20.9, adult: Secondary | ICD-10-CM

## 2021-09-25 DIAGNOSIS — E119 Type 2 diabetes mellitus without complications: Secondary | ICD-10-CM | POA: Diagnosis present

## 2021-09-25 DIAGNOSIS — F03918 Unspecified dementia, unspecified severity, with other behavioral disturbance: Secondary | ICD-10-CM | POA: Diagnosis present

## 2021-09-25 DIAGNOSIS — Z7982 Long term (current) use of aspirin: Secondary | ICD-10-CM

## 2021-09-25 DIAGNOSIS — Z9049 Acquired absence of other specified parts of digestive tract: Secondary | ICD-10-CM

## 2021-09-25 DIAGNOSIS — Z79899 Other long term (current) drug therapy: Secondary | ICD-10-CM

## 2021-09-25 DIAGNOSIS — J449 Chronic obstructive pulmonary disease, unspecified: Secondary | ICD-10-CM | POA: Diagnosis present

## 2021-09-25 DIAGNOSIS — Z91119 Patient's noncompliance with dietary regimen due to unspecified reason: Secondary | ICD-10-CM

## 2021-09-25 DIAGNOSIS — E785 Hyperlipidemia, unspecified: Secondary | ICD-10-CM | POA: Diagnosis present

## 2021-09-25 DIAGNOSIS — R739 Hyperglycemia, unspecified: Principal | ICD-10-CM

## 2021-09-25 DIAGNOSIS — Z8507 Personal history of malignant neoplasm of pancreas: Secondary | ICD-10-CM

## 2021-09-25 DIAGNOSIS — R569 Unspecified convulsions: Secondary | ICD-10-CM

## 2021-09-25 DIAGNOSIS — Z794 Long term (current) use of insulin: Secondary | ICD-10-CM

## 2021-09-25 DIAGNOSIS — Z881 Allergy status to other antibiotic agents status: Secondary | ICD-10-CM

## 2021-09-25 DIAGNOSIS — G40909 Epilepsy, unspecified, not intractable, without status epilepticus: Secondary | ICD-10-CM | POA: Diagnosis present

## 2021-09-25 DIAGNOSIS — E875 Hyperkalemia: Secondary | ICD-10-CM | POA: Diagnosis not present

## 2021-09-25 DIAGNOSIS — F17200 Nicotine dependence, unspecified, uncomplicated: Secondary | ICD-10-CM | POA: Diagnosis present

## 2021-09-25 DIAGNOSIS — E1165 Type 2 diabetes mellitus with hyperglycemia: Secondary | ICD-10-CM | POA: Diagnosis not present

## 2021-09-25 DIAGNOSIS — E43 Unspecified severe protein-calorie malnutrition: Secondary | ICD-10-CM | POA: Diagnosis present

## 2021-09-25 DIAGNOSIS — F329 Major depressive disorder, single episode, unspecified: Secondary | ICD-10-CM | POA: Diagnosis present

## 2021-09-25 DIAGNOSIS — Z90411 Acquired partial absence of pancreas: Secondary | ICD-10-CM

## 2021-09-25 DIAGNOSIS — E739 Lactose intolerance, unspecified: Secondary | ICD-10-CM | POA: Diagnosis present

## 2021-09-25 DIAGNOSIS — Z885 Allergy status to narcotic agent status: Secondary | ICD-10-CM

## 2021-09-25 DIAGNOSIS — L89152 Pressure ulcer of sacral region, stage 2: Secondary | ICD-10-CM | POA: Diagnosis present

## 2021-09-25 DIAGNOSIS — F17210 Nicotine dependence, cigarettes, uncomplicated: Secondary | ICD-10-CM | POA: Diagnosis present

## 2021-09-25 DIAGNOSIS — Z8673 Personal history of transient ischemic attack (TIA), and cerebral infarction without residual deficits: Secondary | ICD-10-CM

## 2021-09-25 DIAGNOSIS — F0393 Unspecified dementia, unspecified severity, with mood disturbance: Secondary | ICD-10-CM | POA: Diagnosis present

## 2021-09-25 DIAGNOSIS — E871 Hypo-osmolality and hyponatremia: Secondary | ICD-10-CM | POA: Diagnosis present

## 2021-09-25 DIAGNOSIS — Z818 Family history of other mental and behavioral disorders: Secondary | ICD-10-CM

## 2021-09-25 DIAGNOSIS — Z9081 Acquired absence of spleen: Secondary | ICD-10-CM

## 2021-09-25 DIAGNOSIS — R000 Tachycardia, unspecified: Secondary | ICD-10-CM | POA: Diagnosis present

## 2021-09-25 DIAGNOSIS — G9341 Metabolic encephalopathy: Secondary | ICD-10-CM | POA: Diagnosis present

## 2021-09-25 DIAGNOSIS — E11649 Type 2 diabetes mellitus with hypoglycemia without coma: Secondary | ICD-10-CM | POA: Diagnosis not present

## 2021-09-25 DIAGNOSIS — E891 Postprocedural hypoinsulinemia: Secondary | ICD-10-CM | POA: Diagnosis present

## 2021-09-25 DIAGNOSIS — Z8619 Personal history of other infectious and parasitic diseases: Secondary | ICD-10-CM

## 2021-09-25 DIAGNOSIS — L8990 Pressure ulcer of unspecified site, unspecified stage: Secondary | ICD-10-CM

## 2021-09-25 DIAGNOSIS — E1169 Type 2 diabetes mellitus with other specified complication: Secondary | ICD-10-CM | POA: Diagnosis present

## 2021-09-25 DIAGNOSIS — G2581 Restless legs syndrome: Secondary | ICD-10-CM | POA: Diagnosis present

## 2021-09-25 DIAGNOSIS — K219 Gastro-esophageal reflux disease without esophagitis: Secondary | ICD-10-CM | POA: Diagnosis present

## 2021-09-25 DIAGNOSIS — Z8719 Personal history of other diseases of the digestive system: Secondary | ICD-10-CM

## 2021-09-25 DIAGNOSIS — F0394 Unspecified dementia, unspecified severity, with anxiety: Secondary | ICD-10-CM | POA: Diagnosis present

## 2021-09-25 DIAGNOSIS — F419 Anxiety disorder, unspecified: Secondary | ICD-10-CM | POA: Diagnosis present

## 2021-09-25 DIAGNOSIS — K8689 Other specified diseases of pancreas: Secondary | ICD-10-CM

## 2021-09-25 DIAGNOSIS — Z933 Colostomy status: Secondary | ICD-10-CM

## 2021-09-25 LAB — CBC
HCT: 34.1 % — ABNORMAL LOW (ref 36.0–46.0)
Hemoglobin: 10.8 g/dL — ABNORMAL LOW (ref 12.0–15.0)
MCH: 28.6 pg (ref 26.0–34.0)
MCHC: 31.7 g/dL (ref 30.0–36.0)
MCV: 90.2 fL (ref 80.0–100.0)
Platelets: 449 10*3/uL — ABNORMAL HIGH (ref 150–400)
RBC: 3.78 MIL/uL — ABNORMAL LOW (ref 3.87–5.11)
RDW: 17.9 % — ABNORMAL HIGH (ref 11.5–15.5)
WBC: 7.1 10*3/uL (ref 4.0–10.5)
nRBC: 0.3 % — ABNORMAL HIGH (ref 0.0–0.2)

## 2021-09-25 LAB — COMPREHENSIVE METABOLIC PANEL
ALT: 42 U/L (ref 0–44)
AST: 51 U/L — ABNORMAL HIGH (ref 15–41)
Albumin: 3 g/dL — ABNORMAL LOW (ref 3.5–5.0)
Alkaline Phosphatase: 136 U/L — ABNORMAL HIGH (ref 38–126)
Anion gap: 8 (ref 5–15)
BUN: 25 mg/dL — ABNORMAL HIGH (ref 8–23)
CO2: 20 mmol/L — ABNORMAL LOW (ref 22–32)
Calcium: 9 mg/dL (ref 8.9–10.3)
Chloride: 100 mmol/L (ref 98–111)
Creatinine, Ser: 0.99 mg/dL (ref 0.44–1.00)
GFR, Estimated: >60 mL/min (ref >60)
Glucose, Bld: 568 mg/dL (ref 70–99)
Potassium: 6.3 mmol/L (ref 3.5–5.1)
Sodium: 128 mmol/L — ABNORMAL LOW (ref 135–145)
Total Bilirubin: 0.4 mg/dL (ref 0.3–1.2)
Total Protein: 7.7 g/dL (ref 6.5–8.1)

## 2021-09-25 LAB — CBG MONITORING, ED: Glucose-Capillary: 578 mg/dL (ref 70–99)

## 2021-09-25 LAB — TROPONIN I (HIGH SENSITIVITY): Troponin I (High Sensitivity): 10 ng/L (ref <18)

## 2021-09-25 MED ORDER — OLANZAPINE 7.5 MG PO TABS
7.5000 mg | ORAL_TABLET | Freq: Every day | ORAL | Status: DC
  Administered 2021-09-25 – 2021-09-26 (×2): 7.5 mg via ORAL
  Filled 2021-09-25: qty 2
  Filled 2021-09-25: qty 1

## 2021-09-25 MED ORDER — ONDANSETRON HCL 4 MG/2ML IJ SOLN: 4.0000 mg | Freq: Four times a day (QID) | INTRAMUSCULAR | Status: AC | PRN

## 2021-09-25 MED ORDER — ACETAMINOPHEN 650 MG RE SUPP: 650.0000 mg | Freq: Four times a day (QID) | RECTAL | Status: AC | PRN

## 2021-09-25 MED ORDER — LABETALOL HCL 5 MG/ML IV SOLN: 5.0000 mg | INTRAVENOUS | Status: DC | PRN

## 2021-09-25 MED ORDER — INSULIN ASPART 100 UNIT/ML IJ SOLN
0.0000 [IU] | Freq: Every day | INTRAMUSCULAR | Status: DC
  Administered 2021-09-27: 3 [IU] via SUBCUTANEOUS
  Administered 2021-09-28: 5 [IU] via SUBCUTANEOUS
  Administered 2021-09-30 – 2021-10-02 (×2): 3 [IU] via SUBCUTANEOUS
  Administered 2021-10-03: 2 [IU] via SUBCUTANEOUS
  Administered 2021-10-04 – 2021-10-06 (×2): 3 [IU] via SUBCUTANEOUS
  Administered 2021-10-07: 5 [IU] via SUBCUTANEOUS
  Administered 2021-10-11: 4 [IU] via SUBCUTANEOUS
  Administered 2021-10-12: 5 [IU] via SUBCUTANEOUS
  Filled 2021-09-25 (×10): qty 1

## 2021-09-25 MED ORDER — INSULIN ASPART 100 UNIT/ML IV SOLN
10.0000 [IU] | Freq: Once | INTRAVENOUS | Status: AC
Start: 2021-09-25 — End: 2021-09-25
  Administered 2021-09-25: 10 [IU] via INTRAVENOUS
  Filled 2021-09-25: qty 0.1

## 2021-09-25 MED ORDER — ACETAMINOPHEN 325 MG PO TABS
650.0000 mg | ORAL_TABLET | Freq: Four times a day (QID) | ORAL | Status: AC | PRN
Start: 2021-09-25 — End: 2021-09-30
  Administered 2021-09-28 – 2021-09-30 (×2): 650 mg via ORAL
  Filled 2021-09-25 (×3): qty 2

## 2021-09-25 MED ORDER — ATORVASTATIN CALCIUM 20 MG PO TABS
40.0000 mg | ORAL_TABLET | Freq: Every day | ORAL | Status: DC
  Administered 2021-09-26 – 2021-10-18 (×23): 40 mg via ORAL
  Filled 2021-09-25 (×23): qty 2

## 2021-09-25 MED ORDER — ADULT MULTIVITAMIN W/MINERALS CH
1.0000 | ORAL_TABLET | Freq: Every day | ORAL | Status: DC
  Administered 2021-09-27 – 2021-09-30 (×4): 1 via ORAL
  Filled 2021-09-25 (×5): qty 1

## 2021-09-25 MED ORDER — CALCIUM GLUCONATE-NACL 1-0.675 GM/50ML-% IV SOLN
1.0000 g | Freq: Once | INTRAVENOUS | Status: AC
  Administered 2021-09-25: 1000 mg via INTRAVENOUS
  Filled 2021-09-25: qty 50

## 2021-09-25 MED ORDER — LACTATED RINGERS IV BOLUS
1000.0000 mL | Freq: Once | INTRAVENOUS | Status: AC
  Administered 2021-09-25: 1000 mL via INTRAVENOUS

## 2021-09-25 MED ORDER — OLANZAPINE 5 MG PO TABS: 7.5000 mg | ORAL_TABLET | Freq: Every day | ORAL | Status: DC

## 2021-09-25 MED ORDER — NICOTINE 21 MG/24HR TD PT24: 21.0000 mg | MEDICATED_PATCH | Freq: Every day | TRANSDERMAL | Status: DC | PRN

## 2021-09-25 MED ORDER — ONDANSETRON HCL 4 MG PO TABS: 4.0000 mg | ORAL_TABLET | Freq: Four times a day (QID) | ORAL | Status: AC | PRN

## 2021-09-25 MED ORDER — ASPIRIN 81 MG PO TBEC
81.0000 mg | DELAYED_RELEASE_TABLET | Freq: Every day | ORAL | Status: DC
  Administered 2021-09-26 – 2021-10-19 (×22): 81 mg via ORAL
  Filled 2021-09-25 (×24): qty 1

## 2021-09-25 MED ORDER — INSULIN ASPART 100 UNIT/ML IJ SOLN
0.0000 [IU] | Freq: Three times a day (TID) | INTRAMUSCULAR | Status: DC
  Administered 2021-09-26: 8 [IU] via SUBCUTANEOUS
  Administered 2021-09-26: 15 [IU] via SUBCUTANEOUS
  Administered 2021-09-27: 11 [IU] via SUBCUTANEOUS
  Administered 2021-09-27: 3 [IU] via SUBCUTANEOUS
  Administered 2021-09-27: 2 [IU] via SUBCUTANEOUS
  Administered 2021-09-28: 11 [IU] via SUBCUTANEOUS
  Administered 2021-09-28: 15 [IU] via SUBCUTANEOUS
  Administered 2021-09-29: 11 [IU] via SUBCUTANEOUS
  Administered 2021-09-29: 8 [IU] via SUBCUTANEOUS
  Administered 2021-09-30: 3 [IU] via SUBCUTANEOUS
  Administered 2021-09-30: 15 [IU] via SUBCUTANEOUS
  Administered 2021-09-30: 5 [IU] via SUBCUTANEOUS
  Administered 2021-10-01: 3 [IU] via SUBCUTANEOUS
  Administered 2021-10-01: 8 [IU] via SUBCUTANEOUS
  Administered 2021-10-01: 15 [IU] via SUBCUTANEOUS
  Administered 2021-10-02 (×2): 2 [IU] via SUBCUTANEOUS
  Administered 2021-10-03: 5 [IU] via SUBCUTANEOUS
  Administered 2021-10-03: 8 [IU] via SUBCUTANEOUS
  Administered 2021-10-04: 2 [IU] via SUBCUTANEOUS
  Administered 2021-10-04: 3 [IU] via SUBCUTANEOUS
  Administered 2021-10-04: 15 [IU] via SUBCUTANEOUS
  Administered 2021-10-05: 11 [IU] via SUBCUTANEOUS
  Administered 2021-10-05: 2 [IU] via SUBCUTANEOUS
  Administered 2021-10-06: 5 [IU] via SUBCUTANEOUS
  Administered 2021-10-06: 2 [IU] via SUBCUTANEOUS
  Administered 2021-10-07: 15 [IU] via SUBCUTANEOUS
  Administered 2021-10-07: 11 [IU] via SUBCUTANEOUS
  Administered 2021-10-08: 2 [IU] via SUBCUTANEOUS
  Administered 2021-10-08: 11 [IU] via SUBCUTANEOUS
  Administered 2021-10-08: 2 [IU] via SUBCUTANEOUS
  Administered 2021-10-09: 3 [IU] via SUBCUTANEOUS
  Administered 2021-10-09: 2 [IU] via SUBCUTANEOUS
  Administered 2021-10-09: 3 [IU] via SUBCUTANEOUS
  Administered 2021-10-10: 8 [IU] via SUBCUTANEOUS
  Administered 2021-10-10: 2 [IU] via SUBCUTANEOUS
  Administered 2021-10-10: 5 [IU] via SUBCUTANEOUS
  Administered 2021-10-11: 17 [IU] via SUBCUTANEOUS
  Administered 2021-10-11 – 2021-10-12 (×2): 5 [IU] via SUBCUTANEOUS
  Administered 2021-10-13: 15 [IU] via SUBCUTANEOUS
  Filled 2021-09-25 (×43): qty 1

## 2021-09-25 MED ORDER — MIRTAZAPINE 15 MG PO TABS
15.0000 mg | ORAL_TABLET | Freq: Every day | ORAL | Status: DC
  Administered 2021-09-26 – 2021-10-18 (×23): 15 mg via ORAL
  Filled 2021-09-25 (×23): qty 1

## 2021-09-25 MED ORDER — INSULIN GLARGINE-YFGN 100 UNIT/ML ~~LOC~~ SOLN
8.0000 [IU] | Freq: Every day | SUBCUTANEOUS | Status: DC
  Administered 2021-09-26 – 2021-09-29 (×5): 8 [IU] via SUBCUTANEOUS
  Filled 2021-09-25 (×5): qty 0.08

## 2021-09-25 MED ORDER — ENOXAPARIN SODIUM 40 MG/0.4ML IJ SOSY
40.0000 mg | PREFILLED_SYRINGE | Freq: Every day | INTRAMUSCULAR | Status: DC
  Administered 2021-09-26 – 2021-09-30 (×5): 40 mg via SUBCUTANEOUS
  Filled 2021-09-25 (×5): qty 0.4

## 2021-09-25 MED ORDER — SENNOSIDES-DOCUSATE SODIUM 8.6-50 MG PO TABS: 1.0000 | ORAL_TABLET | Freq: Every evening | ORAL | Status: DC | PRN

## 2021-09-25 MED ORDER — ROPINIROLE HCL 1 MG PO TABS
0.5000 mg | ORAL_TABLET | Freq: Every day | ORAL | Status: DC
  Administered 2021-09-26 – 2021-10-18 (×23): 0.5 mg via ORAL
  Filled 2021-09-25 (×21): qty 1
  Filled 2021-09-25: qty 2
  Filled 2021-09-25 (×2): qty 1

## 2021-09-25 NOTE — ED Triage Notes (Signed)
Pt to ED via EMS from home c/o vomiting x2, lower back pain chronic, and CBG monitor reading high.  Pt also has left side colostomy bag that has peeled away.  Pt A&O to person, place, and situation by not to year.  Pt seen here two days ago for the similar problem.

## 2021-09-25 NOTE — Assessment & Plan Note (Addendum)
Colostomy bag in place

## 2021-09-25 NOTE — H&P (Signed)
History and Physical   Brittany [name] KXF:[id number] DOB: 04/05/1960 DOA: 09/25/2021  PCP: [name], NP  Outpatient Specialists: Dr. Tasia Catchings, medical oncology Patient coming from: Home via EMS  I have personally briefly reviewed patient's old medical records in [place].  Chief Concern: Intractable nausea and vomiting  HPI: Brittany [name] is a 61 year old female, with hyperlipidemia, poorly controlled insulin dependent diabetes, history of pancreatic cancer status post pancreectomy and splenectomy, colostomy status, restless leg syndrome, who presents emergency department for chief concerns of nausea and vomiting.  Initial vitals in the emergency department showed temperature of 98.6, respiration rate 18, heart rate of 91, blood pressure 129/88, SPO2 of 98% on room air.  Serum sodium is 128, potassium 6.3, chloride of 100, bicarb 20, BUN of 25, serum creatinine of 0.99, GFR greater than 60, nonfasting blood glucose 568, WBC 7.1, hemoglobin 10.8, platelets of 449.  High sensitive troponin was 10.  ED treatment: Zyprexa 7.5 mg, aspart 10 units, calcium gluconate, LR 1 L bolus.  At bedside patient is sleeping and does not appear to be in acute distress.  She was able to murmur her name and then went back to sleep.  She denies being in pain by shaking her head.  She endorses her colostomy bag malfunctioning.  ROS: Unable to complete as patient is sleepy  ED Course: Discussed with emergency medicine provider, patient requiring hospitalization for chief concerns of hyperkalemia.  Assessment/Plan  Principal Problem:   Hyperkalemia Active Problems:   Type 2 diabetes mellitus with hyperlipidemia (HCC)   Essential hypertension   Uncontrolled type 2 diabetes mellitus with hypoglycemia, with long-term current use of insulin (HCC)   History of stroke   Restless leg syndrome   Seizure disorder (HCC)   Anxiety and depression   HLD (hyperlipidemia)   Protein-calorie  malnutrition, severe   Status post colostomy ([place])   History of gastroesophageal reflux (GERD)   Tobacco dependence   Assessment and Plan:  * Hyperkalemia - Status post insulin aspart 10 units, calcium gluconate - Check BMP in the a.m.  Essential hypertension - Labetalol 5 mg IV every 3 hours as needed for SBP greater than 180, 4 doses ordered  Type 2 diabetes mellitus with hyperlipidemia (HCC) - Resumed home insulin glargine 8 units nightly - Insulin SSI with at bedtime coverage ordered  History of stroke - Atorvastatin 40 mg, aspirin 81 mg resumed  Anxiety and depression - Mirtazapine 15 mg nightly resumed  Restless leg syndrome - Ropinirole 0.5 mg nightly resumed  HLD (hyperlipidemia) - Atorvastatin 40 mg nightly resume  Protein-calorie malnutrition, severe - Mirtazapine 15 mg nightly resumed - Dietary has been consulted  Status post colostomy ([place]) - Colostomy bag in place - Wound/ostomy care ordered  Tobacco dependence - Nicotine patch as needed ordered  Chart reviewed.   DVT prophylaxis: Enoxaparin Code Status: Full code Diet: Heart healthy/carb modified Family Communication: No Disposition Plan: Pending clinical course Consults called: TOC Admission status: Telemetry medical, observation  Past Medical History:  Diagnosis Date   C. 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   PFTs on 06/15/15 with FEV1/FVC ratio of 64%, FEV1 83%, DLCO 47%   Diabetes mellitus without complication ([place])    Dyslipidemia    History of Clostridium difficile colitis 03/03/2021   Hyperosmolar hyperglycemic state (HHS) ([place]) 11/18/2020   Hypertension    Hypokalemia;  hyperkalemia 07/12/2019   Lactic acidosis 11/19/2020   Lupus (HCC)    Pancreatic cancer (HCC)    RLS (restless legs syndrome)    Seizure disorder (HCC)    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 She has been exposed to tobacco smoke. She has never used smokeless tobacco. She reports that she does not currently use alcohol. She reports current drug use. Drug: Marijuana.  Allergies  Allergen Reactions   Cephalosporins Itching    TOLERATED ZOSYN (PIPERACILLIN) BEFORE   Hydromorphone Itching   Keflin [Cephalothin] Itching   Lactose Intolerance (Gi) Diarrhea   Family History  Problem Relation Age of Onset   Anxiety disorder Sister    Breast cancer Maternal Aunt    Family history: Family history reviewed and not pertinent.  Prior to Admission medications   Medication Sig Start Date End Date Taking? Authorizing Provider  aspirin 81 MG EC tablet Take 1 tablet (81 mg total) by mouth daily. Swallow whole. 05/23/21  Yes Pokhrel, Laxman, MD  atorvastatin (LIPITOR) 40 MG tablet Take 1 tablet (40 mg total) by mouth Nightly. Patient taking differently: Take 40 mg by mouth at bedtime. 03/14/21 09/25/21 Yes Fritzi Mandes, MD  famotidine (PEPCID) 20 MG tablet Take 1 tablet (20 mg total) by mouth daily. 08/25/21  Yes Fritzi Mandes, MD  feeding supplement, GLUCERNA SHAKE, (GLUCERNA SHAKE) LIQD Take 237 mLs by mouth 3 (three) times daily between meals. 08/24/21  Yes Fritzi Mandes, MD  insulin lispro (HUMALOG) 100 UNIT/ML injection 0-9 units TID with meals, and 0-5 units QHS bases on sliding scale previously prescribed to patient. 08/31/21 08/31/22 Yes Cherene Altes, MD  LANTUS SOLOSTAR 100 UNIT/ML Solostar Pen Inject 8 Units into the skin daily. 08/28/21  Yes Fritzi Mandes, MD  mirtazapine (REMERON) 15 MG tablet Take  15 mg by mouth at bedtime.   Yes [provider]  Multiple Vitamin (MULTIVITAMIN WITH MINERALS) TABS tablet Take 1 tablet by mouth daily. 08/25/21  Yes Fritzi Mandes, MD  OLANZapine (ZYPREXA) 7.5 MG tablet Take 1 tablet (7.5 mg total) by mouth at bedtime. 06/27/21 09/25/21 Yes Enzo Bi, MD  rOPINIRole (REQUIP) 0.5 MG tablet Take 0.5 mg by mouth at bedtime. 06/29/21  Yes [provider]   Physical Exam: Vitals:   09/25/21 2055  BP: 129/88  Pulse: 91  Resp: 18  Temp: 98.6 F (37 C)  TempSrc: Oral  SpO2: 98%  Weight: 47 kg  Height: 5' (1.524 m)   Constitutional: appears age-appropriate, frail, NAD, calm, comfortable Eyes: PERRL, lids and conjunctivae normal ENMT: Mucous membranes are moist. Posterior pharynx clear of any exudate or lesions. Age-appropriate dentition. Hearing appropriate Neck: normal, supple, no masses, no thyromegaly Respiratory: clear to auscultation bilaterally, no wheezing, no crackles. Normal respiratory effort. No accessory muscle use.  Cardiovascular: Regular rate and rhythm, no murmurs / rubs / gallops. No extremity edema. 2+ pedal pulses. No carotid bruits.  Abdomen: no tenderness, no masses palpated, no hepatosplenomegaly. Bowel sounds positive.  Musculoskeletal: no clubbing / cyanosis. No joint deformity upper and lower extremities. Good ROM, no contractures, no atrophy. Normal muscle tone.  Skin:  no rashes, lesions, ulcers. No induration Neurologic: Sensation intact. Strength 5/5 in all 4.  Psychiatric: Alert and oriented x self.  Flat affect.  EKG: independently reviewed, showing sinus rhythm with rate of 91, QTc 414  Chest x-ray on Admission: Not indicated at this time  Labs on Admission: I have personally reviewed following labs  CBC: Recent Labs  Lab 09/21/21 1548 09/25/21 2117  WBC 8.3 7.1  NEUTROABS 5.8  --   HGB 14.3 10.8*  HCT 44.5 34.1*  MCV 88.5 90.2  PLT 512* 453*   Basic Metabolic Panel: Recent Labs  Lab  09/21/21 1919 09/21/21 2132 09/22/21 0123 09/22/21 0535 09/23/21 0521 09/23/21 2304 09/25/21 2117  NA 129* 131* 135 138  --   --  128*  K 4.8 4.6 3.5 3.6  --   --  6.3*  CL 102 105 108 110  --   --  100  CO2 20* 19* 22 24  --   --  20*  GLUCOSE 529* 263* 193* 152*  --  257* 568*  BUN 35* 29* 23 21  --   --  25*  CREATININE 1.02* 0.81 0.58 0.55  --   --  0.99  CALCIUM 10.2 9.9 9.9 9.8  --   --  9.0  MG  --   --   --   --  1.7  --   --   PHOS  --   --   --   --  3.8  --   --    GFR: Estimated Creatinine Clearance: 42.9 mL/min (by C-G formula based on SCr of 0.99 mg/dL).  Liver Function Tests: Recent Labs  Lab 09/21/21 1548 09/25/21 2117  AST 36 51*  ALT 35 42  ALKPHOS 182* 136*  BILITOT 0.7 0.4  PROT 10.7* 7.7  ALBUMIN 4.1 3.0*   Cardiac Enzymes: Recent Labs  Lab 09/21/21 1548  CKTOTAL 74   CBG: Recent Labs  Lab 09/24/21 0544 09/24/21 0714 09/24/21 1117 09/24/21 1707 09/25/21 2104  GLUCAP 105* 128* 269* 127* 578*   Urine analysis:    Component Value Date/Time   COLORURINE STRAW (A) 09/06/2021 0645   APPEARANCEUR CLEAR (A) 09/06/2021 0645   APPEARANCEUR Hazy 04/04/2011 1935   LABSPEC 1.017 09/06/2021 0645   LABSPEC >1.060 04/04/2011 1935   PHURINE 5.0 09/06/2021 0645   GLUCOSEU >=500 (A) 09/06/2021 0645   GLUCOSEU 50 mg/dL 04/04/2011 1935   HGBUR NEGATIVE 09/06/2021 0645   BILIRUBINUR NEGATIVE 09/06/2021 0645   BILIRUBINUR Negative 04/04/2011 1935   KETONESUR NEGATIVE 09/06/2021 0645   PROTEINUR NEGATIVE 09/06/2021 0645   NITRITE NEGATIVE 09/06/2021 0645   LEUKOCYTESUR NEGATIVE 09/06/2021 0645   LEUKOCYTESUR Negative 04/04/2011 1935   Dr. Tobie Poet [facility]  If 7PM-7AM, please contact overnight-coverage provider If 7AM-7PM, please contact day coverage provider [email or web address]  09/25/2021, 10:55 PM

## 2021-09-25 NOTE — Assessment & Plan Note (Addendum)
Continue atorvastatin 40 mg daily.  

## 2021-09-25 NOTE — ED Notes (Signed)
Pt fall alarm going off, patient refusing to get back in bed.  MD notified and EDT with patient to sit for safety.

## 2021-09-25 NOTE — Assessment & Plan Note (Addendum)
Continue atorvastatin 40 mg, aspirin 81 mg

## 2021-09-25 NOTE — Assessment & Plan Note (Addendum)
Mirtazapine 15 mg nightly
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
## 2021-09-25 NOTE — Assessment & Plan Note (Addendum)
Now resolved.  Likely hyperkalemia secondary to elevated sugars.
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
## 2021-09-25 NOTE — Assessment & Plan Note (Addendum)
Continue insulin Levemir 6 units twice daily and NovoLog 3 units 3 times daily with each meals.  Adjust as needed.

## 2021-09-25 NOTE — Assessment & Plan Note (Signed)
-   Labetalol 5 mg IV every 3 hours as needed for SBP greater than 180, 4 doses ordered

## 2021-09-25 NOTE — Assessment & Plan Note (Addendum)
-  Nicotine patch 

## 2021-09-25 NOTE — Assessment & Plan Note (Addendum)
Mirtazapine 15 mg nightly.

## 2021-09-25 NOTE — Assessment & Plan Note (Addendum)
Ropinirole 0.5 mg nightly

## 2021-09-26 DIAGNOSIS — E875 Hyperkalemia: Secondary | ICD-10-CM | POA: Diagnosis not present

## 2021-09-26 LAB — BASIC METABOLIC PANEL
Anion gap: 6 (ref 5–15)
Anion gap: 10 (ref 5–15)
BUN: 20 mg/dL (ref 8–23)
BUN: 22 mg/dL (ref 8–23)
CO2: 23 mmol/L (ref 22–32)
CO2: 19 mmol/L — ABNORMAL LOW (ref 22–32)
Calcium: 10.1 mg/dL (ref 8.9–10.3)
Calcium: 9.2 mg/dL (ref 8.9–10.3)
Chloride: 108 mmol/L (ref 98–111)
Chloride: 98 mmol/L (ref 98–111)
Creatinine, Ser: 0.6 mg/dL (ref 0.44–1.00)
Creatinine, Ser: 0.94 mg/dL (ref 0.44–1.00)
GFR, Estimated: >60 mL/min (ref >60)
GFR, Estimated: >60 mL/min (ref >60)
Glucose, Bld: 263 mg/dL — ABNORMAL HIGH (ref 70–99)
Glucose, Bld: 701 mg/dL (ref 70–99)
Potassium: 4.4 mmol/L (ref 3.5–5.1)
Potassium: 5.8 mmol/L — ABNORMAL HIGH (ref 3.5–5.1)
Sodium: 137 mmol/L (ref 135–145)
Sodium: 127 mmol/L — ABNORMAL LOW (ref 135–145)

## 2021-09-26 LAB — CBG MONITORING, ED
Glucose-Capillary: 495 mg/dL — ABNORMAL HIGH (ref 70–99)
Glucose-Capillary: 201 mg/dL — ABNORMAL HIGH (ref 70–99)

## 2021-09-26 LAB — CBC
HCT: 36.6 % (ref 36.0–46.0)
Hemoglobin: 11.8 g/dL — ABNORMAL LOW (ref 12.0–15.0)
MCH: 29.4 pg (ref 26.0–34.0)
MCHC: 32.2 g/dL (ref 30.0–36.0)
MCV: 91.3 fL (ref 80.0–100.0)
Platelets: 481 10*3/uL — ABNORMAL HIGH (ref 150–400)
RBC: 4.01 MIL/uL (ref 3.87–5.11)
RDW: 18.1 % — ABNORMAL HIGH (ref 11.5–15.5)
WBC: 8.2 10*3/uL (ref 4.0–10.5)
nRBC: 0 % (ref 0.0–0.2)

## 2021-09-26 LAB — GLUCOSE, CAPILLARY
Glucose-Capillary: 252 mg/dL — ABNORMAL HIGH (ref 70–99)
Glucose-Capillary: 291 mg/dL — ABNORMAL HIGH (ref 70–99)
Glucose-Capillary: 585 mg/dL (ref 70–99)
Glucose-Capillary: >600 mg/dL (ref 70–99)
Glucose-Capillary: >600 mg/dL (ref 70–99)
Glucose-Capillary: >600 mg/dL (ref 70–99)
Glucose-Capillary: 421 mg/dL — ABNORMAL HIGH (ref 70–99)

## 2021-09-26 MED ORDER — ALPRAZOLAM 0.25 MG PO TABS: 0.2500 mg | ORAL_TABLET | Freq: Two times a day (BID) | ORAL | Status: DC | PRN

## 2021-09-26 MED ORDER — INSULIN ASPART 100 UNIT/ML IJ SOLN
15.0000 [IU] | Freq: Once | INTRAMUSCULAR | Status: AC
  Administered 2021-09-26: 15 [IU] via SUBCUTANEOUS
  Filled 2021-09-26: qty 1

## 2021-09-26 MED ORDER — LORAZEPAM 2 MG/ML IJ SOLN: 0.5000 mg | Freq: Four times a day (QID) | INTRAMUSCULAR | Status: DC | PRN

## 2021-09-26 MED ORDER — INSULIN ASPART 100 UNIT/ML IJ SOLN
10.0000 [IU] | Freq: Once | INTRAMUSCULAR | Status: AC
Start: 2021-09-26 — End: 2021-09-26
  Administered 2021-09-26: 10 [IU] via SUBCUTANEOUS

## 2021-09-26 MED ORDER — LORAZEPAM 0.5 MG PO TABS: 0.5000 mg | ORAL_TABLET | Freq: Four times a day (QID) | ORAL | Status: DC | PRN

## 2021-09-26 MED ORDER — LORAZEPAM 0.5 MG PO TABS
0.5000 mg | ORAL_TABLET | Freq: Four times a day (QID) | ORAL | Status: DC | PRN
  Administered 2021-09-26 – 2021-10-03 (×5): 0.5 mg via ORAL
  Filled 2021-09-26 (×6): qty 1

## 2021-09-26 MED ORDER — LORAZEPAM 2 MG/ML IJ SOLN
0.5000 mg | Freq: Once | INTRAMUSCULAR | Status: AC
  Administered 2021-09-26: 0.5 mg via INTRAVENOUS
  Filled 2021-09-26: qty 1

## 2021-09-26 MED ORDER — TRAZODONE HCL 50 MG PO TABS
25.0000 mg | ORAL_TABLET | Freq: Every evening | ORAL | Status: DC | PRN
  Administered 2021-09-27 – 2021-10-04 (×5): 25 mg via ORAL
  Filled 2021-09-26 (×5): qty 1

## 2021-09-26 MED ORDER — LORAZEPAM 2 MG/ML IJ SOLN
0.5000 mg | Freq: Four times a day (QID) | INTRAMUSCULAR | Status: DC | PRN
  Administered 2021-10-12: 0.5 mg via INTRAMUSCULAR
  Filled 2021-09-26: qty 1

## 2021-09-26 MED ORDER — INSULIN ASPART 100 UNIT/ML IJ SOLN
20.0000 [IU] | Freq: Once | INTRAMUSCULAR | Status: AC
  Administered 2021-09-26: 20 [IU] via SUBCUTANEOUS
  Filled 2021-09-26: qty 1

## 2021-09-26 NOTE — Progress Notes (Signed)
Patient was assisted with ambulation in the hall. She refused to return to her room. She was assisted to the restroom and then ambulated 100 feet in the hall way.   She was then assisted to her room, bed alarm was turned on.   [facility] informed of her agitation.
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
## 2021-09-26 NOTE — Progress Notes (Signed)
Patient assisted back into bed, again. She was attempting to enter another patient's room.  Continues confused, and agitated. Was offered oral intake and a snack. Denies any pain.   [facility] NP at bedside.

## 2021-09-26 NOTE — Progress Notes (Signed)
[facility]) Hospital Liaison note:  This is a pending outpatient-based Palliative Care patient. Will continue to follow for disposition.  Please call with any outpatient palliative questions or concerns.  Thank you, Lorelee Market, LPN [facility] Liaison [phone]
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
## 2021-09-26 NOTE — TOC Progression Note (Signed)
Transition of Care [facility]) - Progression Note    Patient Details  Name: Brittany [name] MRN: [id number] Date of Birth: 11/14/1960  Transition of Care [facility]) CM/SW [place], RN Phone Number: 09/26/2021, 3:06 PM  Clinical Narrative:   Received a call from [place] worker [phone] stating there is currently a protective order and patient cannot be discharged, care team aware discharge cancelled.    Expected Discharge Plan: [place] Barriers to Discharge: Barriers Resolved  Expected Discharge Plan and Services Expected Discharge Plan: [place]   Discharge Planning Services: CM Consult Post Acute Care Choice: [place] arrangements for the past 2 months: Single Family Home Expected Discharge Date: 09/26/21                         HH Arranged: RN, PT, OT, Nurse's Aide [place] Agency: [place] ([place]), [place] Date [facility] Agency Contacted: 09/26/21   Representative spoke with at [place]: cheryl   Social Determinants of Health (SDOH) Interventions    Readmission Risk Interventions    09/22/2021    1:24 PM 03/06/2021    9:13 AM 02/16/2021    2:45 PM  Readmission Risk Prevention Plan  Transportation Screening Complete Complete Complete  Medication Review Press photographer) Complete Complete Complete  PCP or Specialist appointment within 3-5 days of discharge Complete Complete   HRI or [place] Complete    SW Recovery Care/Counseling Consult Complete  Complete  Palliative Care Screening Not Applicable Not Applicable Not [place] Complete Not Applicable Not Applicable

## 2021-09-26 NOTE — Progress Notes (Incomplete)
       CROSS COVER NOTE  NAME: Brittany [name] MRN: [id number] DOB : January 06, 1961    Date of Service   09/26/21  HPI/Events of Note   Responded to Rapid Response called at [place] for Hyperglycemia. Patient has missed meal time coverage since lunch today secondary to her refusing CBG checks. [name] also has a history of behavioral challenges and has been on Trazodone, Zyprexa, Remeron, and PRN Ativan while inpatient. Discussed with attending and meds reordered.  0200: CBG 66  Interventions   Plan: STAT Glucose Verification BMP 20U Novolog  PRN Dextrose Oral Gel- no IV access  Replace IV     This document was prepared using Dragon voice recognition software and may include unintentional dictation errors.  Neomia Glass DNP, MHA, FNP-BC Nurse Practitioner [facility] Pager [phone]

## 2021-09-26 NOTE — Progress Notes (Signed)
Patient refused morning labs.

## 2021-09-26 NOTE — Progress Notes (Signed)
   09/26/21 0243  Vitals  Temp 98.1 F (36.7 C)  Temp Source Oral  BP 92/69  MAP (mmHg) 78  BP Location Left Arm  BP Method Automatic  Patient Position (if appropriate) Lying  Pulse Rate 100  Resp 18  Oxygen Therapy  O2 Device Room Air  MEWS Score  MEWS Temp 0  MEWS Systolic 1  MEWS Pulse 0  MEWS RR 0  MEWS LOC 0  MEWS Score 1  MEWS Score Color Green     Patient arrived to the unit, reoriented without success. She is alert but confused to self, place, time and situation.   She was assisted safely into bed. [facility] messaged regarding agitation.

## 2021-09-26 NOTE — Progress Notes (Signed)
       CROSS COVER NOTE  NAME: Brittany [name] MRN: [id number] DOB : 10-07-1960    Date of Service   09/26/21  HPI/Events of Note   Called to bedside by staff reporting M(r)s Casebeer is agitated and attempting to go in other peoples room.  M(r)s Siegmann is well known to me, she requires a Air cabin crew and frequently walks laps around the unit. At this time she is calm and answers questions appropriately at bedside, reports she is hungry. During July hospitalization she was prescribed Remeron, Trazodone, Zyprexa, and Ativan.  Interventions   Plan: Needs ordered safety sitter at bedside Ativan x1     This document was prepared using Dragon voice recognition software and may include unintentional dictation errors.  Neomia Glass DNP, MHA, FNP-BC Nurse Practitioner [facility] Pager [phone]
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
## 2021-09-26 NOTE — Discharge Instructions (Signed)
Take your insulin and check your sugars four times a day Follow carb control diet

## 2021-09-26 NOTE — Progress Notes (Signed)
Initial Nutrition Assessment  DOCUMENTATION CODES:   Severe malnutrition in context of chronic illness  INTERVENTION:   -Liberalize diet to carb modified -Boost Breeze po TID, each supplement provides 250 kcal and 9 grams of protein  -30 ml Prosource Plus TID, each supplement provides 100 kcals and 15 grams protein -MVI with minerals daily  NUTRITION DIAGNOSIS:   Severe Malnutrition related to chronic illness (pancreatic cancer) as evidenced by moderate fat depletion, severe fat depletion, moderate muscle depletion, severe muscle depletion.  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  Pt admitted with hyperkalemia.    Per MD notes, plan to discharge home today.   Case discussed with RN; pt very lethargic this morning, but was sundowning last night- ripping off her colostomy bag and trying to get into other pt rooms. Pt did not arouse to name being called or touch during visit.   Per DM coordinator notes, pt with very labile blood sugars.   Reviewed wt hx; no wt loss noted over the past 6 months.   Medications reviewed and include remeron.  Labs reviewed: CBGS: [id number] (inpatient orders for glycemic control are 0-15 unit insulin aspart TID with meals, 0-5 units insulin aspart daily at bedtime, 8 units insulin glargine-yfgn daily at bedtime).    NUTRITION - FOCUSED PHYSICAL EXAM:  Flowsheet Row Most Recent Value  Orbital Region Moderate depletion  Upper Arm Region Severe depletion  Thoracic and Lumbar Region Severe depletion  Buccal Region Moderate depletion   Temple Region Moderate depletion  Clavicle Bone Region Severe depletion  Clavicle and Acromion Bone Region Severe depletion  Scapular Bone Region Severe depletion  Dorsal Hand Severe depletion  Patellar Region Severe depletion  Anterior Thigh Region Severe depletion  Posterior Calf Region Severe depletion  Edema (RD Assessment) None  Hair Reviewed  Eyes Reviewed  Mouth Reviewed  Skin Reviewed  Nails Reviewed       Diet Order:   Diet Order             Diet Carb Modified           Diet heart healthy/carb modified Room service appropriate? Yes; Fluid consistency: Thin  Diet effective now                   EDUCATION NEEDS:   No education needs have been identified at this time  Skin:  Skin Assessment: Skin Integrity Issues: Skin Integrity Issues:: Stage II Stage II: coccyx Stage III: -  Last BM:  09/26/21 (300 ml via colostomy)  Height:   Ht Readings from Last 1 Encounters:  09/25/21 5' (1.524 m)    Weight:   Wt Readings from Last 1 Encounters:  09/25/21 47 kg    Ideal Body Weight:  45.5 kg  BMI:  Body mass index is 20.24 kg/m.  Estimated Nutritional Needs:   Kcal:  1700-1900  Protein:  90-105 grams  Fluid:  > 1.7 L    Loistine Chance, RD, LDN, [place] Registered Dietitian II Certified Diabetes Care and Education Specialist Please refer to [facility] for RD and/or RD on-call/weekend/after hours pager

## 2021-09-26 NOTE — Discharge Summary (Addendum)
Physician Discharge Summary   [name] MRN: [id number] DOB: November 01, 1960  Admit date:     09/25/2021  Discharge date: 09/26/21  Discharge Physician: Fritzi Mandes   PCP: [name], NP   Recommendations at discharge:    F/u with your PCP in 1-2 weeks Take your insulin and check your sugars four times a day Follow carb control diet  Discharge Diagnoses: Hyperkalemia--resolved Labile type 2 DM   Hospital Course:  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  Hyperkalemia - Status post insulin aspart 10 units, calcium gluconate - K 6.3--4.4  --tolerating po diet well.   Type 2 diabetes mellitus with hyperlipidemia (HCC) H/o Post pancreatectomy DM-2 (h/o pancreatic mass ) - Resumed home insulin glargine 8 units nightly - Insulin SSI with at bedtime coverage ordered -pt's sugars are labile  --pt needs to f/[facility] Dr Zenia [facility] at [facility]  on her October appt   History of stroke - Atorvastatin and asa   Anxiety and depression - Mirtazapine  resumed   Restless leg syndrome - Ropinirole      Status post colostomy ([place]) - Colostomy bag in place - Wound/ostomy care ordered--appreciate input  Malnutrition,severe, chronic illness --f/u Dietitian recommendation --pt is eating well when she is in the hospital  Tobacco dependence - Nicotine patch as needed ordered  Pt overall stable and at baseline. D/c home   Addendum--spoke with dter Brittany [name] on the phone. She expressed her frustration about pt's behavior at home which is different than in the hospital. Pt has been evaluated in the past by Psychiatry (most recent 09/11/21) and is continued on her current meds.  D/w dter that she will need to work to get [employer]  and work with her PCP to get her placed.  Pt will d/c to home today.  I was informed by RN CM that pt cannot go home per APS.  Disposition: Home Diet recommendation:  Discharge Diet Orders (From admission, onward)     Start     Ordered   09/26/21 0000  Diet Carb Modified        09/26/21 1139           Carb modified diet DISCHARGE MEDICATION: Allergies as of 09/26/2021       Reactions   Cephalosporins Itching   TOLERATED ZOSYN (PIPERACILLIN) BEFORE   Hydromorphone Itching   Keflin [cephalothin] Itching   Lactose Intolerance (gi) Diarrhea        Medication List     TAKE these medications    aspirin EC 81 MG tablet Take 1 tablet (81 mg total) by mouth daily. Swallow whole.   atorvastatin 40 MG tablet Commonly known as: LIPITOR Take 1 tablet (40 mg total) by mouth Nightly. What changed: when to take this   famotidine 20 MG tablet Commonly known as: PEPCID Take 1 tablet (20 mg total) by mouth daily.   feeding supplement (GLUCERNA SHAKE) Liqd Take 237 mLs by mouth 3 (three) times daily between meals.   insulin lispro 100 UNIT/ML injection Commonly known as: HumaLOG 0-9 units TID with meals, and 0-5 units QHS bases on sliding scale previously prescribed to patient.   Lantus SoloStar 100 UNIT/ML Solostar Pen Generic drug: insulin glargine Inject 8 Units into the skin daily.   mirtazapine 15 MG tablet Commonly known  as: REMERON Take 15 mg by mouth at bedtime.   multivitamin with minerals Tabs tablet Take 1 tablet by mouth daily.   OLANZapine 7.5 MG tablet Commonly known as: ZYPREXA Take 1 tablet (7.5 mg total) by mouth at bedtime.   rOPINIRole 0.5 MG tablet Commonly known as: REQUIP Take 0.5 mg by mouth at bedtime.               Discharge Care Instructions  (From admission, onward)           Start     Ordered   09/26/21 0000  Discharge wound care:       Comments: Pressure Injury 09/22/21 Coccyx Right;Left;Medial Stage 2 -  Partial  thickness loss of dermis presenting as a shallow open injury with a red, pink wound bed without slough. 3 small open areas. 3 days  foam pad dressing   09/26/21 1139            Follow-up Information     [name], NP. Schedule an appointment as soon as possible for a visit in 1 week(s).   Specialty: Internal Medicine Why: hospital f/u Contact information: [place] [address] [phone]                Discharge Exam: Danley Danker Weights   09/25/21 2055  Weight: 47 kg     Condition at discharge: fair  The results of significant diagnostics from this hospitalization (including imaging, microbiology, ancillary and laboratory) are listed below for reference.   Imaging Studies: CT HEAD WO CONTRAST (5MM)  Result Date: 09/21/2021 CLINICAL DATA:  Altered mental status EXAM: CT HEAD WITHOUT CONTRAST TECHNIQUE: Contiguous axial images were obtained from the base of the skull through the vertex without intravenous contrast. RADIATION DOSE REDUCTION: This exam was performed according to the departmental dose-optimization program which includes automated exposure control, adjustment of the mA and/or kV according to patient size and/or use of iterative reconstruction technique. COMPARISON:  09/04/2021 FINDINGS: Brain: No acute intracranial findings are seen. There are no signs of bleeding within the cranium. There is no focal edema or mass effect. Cortical sulci are prominent. Old infarcts are seen in right frontal lobe, left temporoparietal region and left cerebellum with no significant interval change. Vascular: Unremarkable. Skull: Unremarkable. Sinuses/Orbits: Visualized paranasal sinuses are unremarkable. Lacrimal glands in both orbits appear more prominent than usual. Exophthalmos is seen. Other: No significant interval changes are noted. IMPRESSION: No acute intracranial findings are seen. Old infarcts are seen in both cerebral hemispheres and left  cerebellum with no significant interval change. Electronically Signed   By: Elmer Picker M.D.   On: 09/21/2021 17:24   DG Chest Port 1 View  Result Date: 09/21/2021 CLINICAL DATA:  Questionable sepsis - evaluate for abnormality EXAM: PORTABLE CHEST 1 VIEW COMPARISON:  Chest x-ray 09/04/2021. FINDINGS: Persistent streaky bibasilar opacities. No visible pleural effusions or pneumothorax. Similar cardiomediastinal silhouette, which is within normal limits. Polyarticular degenerative change. IMPRESSION: Persistent streaky bibasilar opacities, which could represent atelectasis and/or pneumonia. Electronically Signed   By: Margaretha Sheffield M.D.   On: 09/21/2021 16:22   CT ABDOMEN PELVIS W CONTRAST  Result Date: 09/04/2021 CLINICAL DATA:  61 year old female with history of prior pancreatectomy and splenectomy presenting with altered mental status. EXAM: CT ABDOMEN AND PELVIS WITH CONTRAST TECHNIQUE: Multidetector CT imaging of the abdomen and pelvis was performed using the standard protocol following bolus administration of intravenous contrast. RADIATION DOSE REDUCTION: This exam was performed according to  the departmental dose-optimization program which includes automated exposure control, adjustment of the mA and/or kV according to patient size and/or use of iterative reconstruction technique. CONTRAST:  140m OMNIPAQUE IOHEXOL 300 MG/ML  SOLN COMPARISON:  April 28, 2021 FINDINGS: Lower chest: Ground-glass and nodular opacities at the lung bases worse in the medial lung bases bilaterally similar to previous imaging from July 26, 2021. Hepatobiliary: Mild hepatic steatosis, no focal suspicious hepatic lesion. Surgical changes in the area the porta hepatis as before. Portal vein is patent. No pericholecystic stranding. Mild dilation of the common bile duct but without intrahepatic biliary duct dilation. Mass effect upon the distal common bile duct in the setting of multi cystic masslike enlargement of the  uncinate process and head of the pancreas. Pancreas: Uncinate process and head of the pancreas measuring 4.9 x 2.8 cm. Surrounding stranding seen on previous imaging has resolved. When compared to the study of March 09, 2021 pancreatic enlargement is similar. Spleen: Surgically absent with surgical clips about the LEFT upper quadrant and distal pancreatic bed following distal pancreatectomy as well. Adrenals/Urinary Tract: Normal adrenal glands. No hydronephrosis. No perinephric stranding. Urinary bladder is moderately distended without surrounding stranding. Stomach/Bowel: Signs of partial colonic resection with ostomy in the LEFT lower quadrant. No signs of stranding adjacent to the stomach stomach is moderately distended with ingested contents. No sign of small bowel dilation or small bowel inflammation. No secondary signs of inflammation adjacent to the cecum. Appendix not visible. Vascular/Lymphatic: No signs of aortic dilation. Atherosclerotic changes both calcified and noncalcified in the infrarenal abdominal aorta. IVC is compressed secondary to mass effect from the enlarged pancreatic head and uncinate process. Reproductive: Post tubal ligation. Grossly normal appearance of reproductive structures on CT. Other: No ascites. Musculoskeletal: No acute bone finding. No destructive bone process. Spinal degenerative changes. IMPRESSION: 1. Cystic masslike enlargement of the pancreatic head and uncinate process with mass effect upon adjacent biliary tree is similar to prior imaging and not associated with signs of active inflammation or frank biliary duct obstruction. 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 2. Findings of suspected basilar pneumonia/aspiration are similar to previous imaging in terms of distribution perhaps minimally improved but with limited assessment due to respiratory motion at the lung bases. 3. Post splenectomy and distal pancreatectomy as before. 4.  Post partial colonic resection with LEFT lower quadrant colostomy. Aortic Atherosclerosis (ICD10-I70.0). Electronically Signed   By: GZetta BillsM.D.   On: 09/04/2021 16:39   DG Chest Portable 1 View  Result Date: 09/04/2021 CLINICAL DATA:  Weakness, erratic  behavior EXAM: PORTABLE CHEST 1 VIEW COMPARISON:  08/13/2021 FINDINGS: Low lung volumes. Mild bilateral interstitial thickening partially accentuated by the low lung volumes. Persistent bibasilar airspace disease which may reflect atelectasis versus pneumonia. No focal consolidation. No pleural effusion or pneumothorax. Heart and mediastinal contours are unremarkable. No acute osseous abnormality. IMPRESSION: 1. Persistent bibasilar airspace disease which may reflect atelectasis versus pneumonia. Electronically Signed   By: HKathreen DevoidM.D.   On: 09/04/2021 14:34   CT HEAD WO CONTRAST (5MM)  Result Date: 09/04/2021 CLINICAL DATA:  There attic behavior EXAM: CT HEAD WITHOUT CONTRAST TECHNIQUE: Contiguous axial images were obtained from the base of the skull through the vertex without intravenous contrast. RADIATION DOSE REDUCTION: This exam was performed according to the departmental dose-optimization program which includes automated exposure control, adjustment of the mA and/or kV according to patient size and/or use of iterative reconstruction technique. COMPARISON:  08/13/2021 FINDINGS: Brain: No change or  acute finding. Old infarction of the left cerebellum. Old infarction of the left posteromedial temporal lobe and occipital lobe. Old right frontal cortical and subcortical infarction. Chronic small-vessel ischemic changes elsewhere affecting the white matter. No mass lesion, hemorrhage, hydrocephalus or extra-axial collection. Vascular: There is atherosclerotic calcification of the major vessels at the base of the brain. Skull: Negative Sinuses/Orbits: Clear/normal Other: None IMPRESSION: No acute CT finding. Old infarctions of the left  cerebellum, left PCA territory and right frontal lobe. Chronic small-vessel ischemic changes throughout the brain. No change since the study of 3 weeks ago. Electronically Signed   By: Nelson Chimes M.D.   On: 09/04/2021 14:04    Microbiology: Results for orders placed or performed during the hospital encounter of 09/21/21  MRSA Next Gen by PCR, Nasal     Status: None   Collection Time: 09/21/21  6:41 PM   Specimen: Nasal Mucosa; Nasal Swab  Result Value Ref Range Status   MRSA by PCR Next Gen NOT DETECTED NOT DETECTED Final    Comment: (NOTE) The GeneXpert MRSA Assay (FDA approved for NASAL specimens only), is one component of a comprehensive MRSA colonization surveillance program. It is not intended to diagnose MRSA infection nor to guide or monitor treatment for MRSA infections. Test performance is not FDA approved in patients less than 71 years old. Performed at [facility], [address] [place], [place] [address]     Labs: CBC: Recent Labs  Lab 09/21/21 1548 09/25/21 2117  WBC 8.3 7.1  NEUTROABS 5.8  --   HGB 14.3 10.8*  HCT 44.5 34.1*  MCV 88.5 90.2  PLT 512* 604*   Basic Metabolic Panel: Recent Labs  Lab 09/21/21 2132 09/22/21 0123 09/22/21 0535 09/23/21 0521 09/23/21 2304 09/25/21 2117 09/26/21 1013  NA 131* 135 138  --   --  128* 137  K 4.6 3.5 3.6  --   --  6.3* 4.4  CL 105 108 110  --   --  100 108  CO2 19* 22 24  --   --  20* 23  GLUCOSE 263* 193* 152*  --  257* 568* 263*  BUN 29* 23 21  --   --  25* 20  CREATININE 0.81 0.58 0.55  --   --  0.99 0.60  CALCIUM 9.9 9.9 9.8  --   --  9.0 10.1  MG  --   --   --  1.7  --   --   --   PHOS  --   --   --  3.8  --   --   --    Liver Function Tests: Recent Labs  Lab 09/21/21 1548 09/25/21 2117  AST 36 51*  ALT 35 42  ALKPHOS 182* 136*  BILITOT 0.7 0.4  PROT 10.7* 7.7  ALBUMIN 4.1 3.0*   CBG: Recent Labs  Lab 09/25/21 2104 09/25/21 2231 09/26/21 0102 09/26/21 0333 09/26/21 0840   GLUCAP 578* 495* 201* 252* 291*    Discharge time spent: greater than 30 minutes.  Signed: Fritzi Mandes, MD [facility] 09/26/2021
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
## 2021-09-26 NOTE — Progress Notes (Signed)
Patient was found exiting the bed, she removed her ostomy and threw it on the floor. She then proceeded to walk into the restroom.   With assistance she sat on the commode and had a urinary event.    Primary RN and NT were able to clean and disinfect the room, all linen changed, ostomy replaced. Bath given.   Comfort afforded.

## 2021-09-26 NOTE — Progress Notes (Signed)
Chaplain responded to rapid response code. Patient was receiving care upon chaplain arrival. Ministry of presence and prayer for patient.

## 2021-09-26 NOTE — Consult Note (Addendum)
Secor Nurse ostomy consult note Stoma type/location:  Pt is familiar to [facility] team from previous visits, most recently on 7/11. Pt states her daughter performs pouch changes when at home; no family members currently at the bedside. She had ostomy surgery performed in Jan, 2023.  Last night, she was agitated and ripped off her ostomy pouch, according to progress notes. Current pouch is intact with scant amt brown stool.  Requested to provide products and instructions for bedside nurses to perform pouch changes.  Stomal assessment/size: Stoma is red and viable, above skin level, 1 1/4 inches.  Peristomal assessment: intact Ostomy pouching: 2pc.  Applied 2 piece pouch and added barrier ring to attempt to maintain a seal. Applied Tegaderm around edges. Applied mesh underwear and ABD pad over the location to attempt to discourage patient from puling off the ostomy pouch. Supplies ordered to the bedside for staff nurses use as follows: barrier ring, Kellie Simmering # [phone], wafer Kellie Simmering # [phone], pouch Lawson #[phone].  Tegaderm left at the bedside and is available in the store room. 3 sets of each pouching supply left in the room for staff nurses' use.  Please re-consult if further assistance is needed. Thank-you,  Julien Girt MSN, [place], [place], [place], [place]

## 2021-09-26 NOTE — Inpatient Diabetes Management (Signed)
Inpatient Diabetes Program Recommendations  AACE/ADA: New Consensus Statement on Inpatient Glycemic Control (2015)  Target Ranges:  Prepandial:   less than 140 mg/dL      Peak postprandial:   less than 180 mg/dL (1-2 hours)      Critically ill patients:  140 - 180 mg/dL    Latest Reference Range & Units 09/25/21 21:04 09/25/21 22:31 09/26/21 01:02 09/26/21 03:33  Glucose-Capillary 70 - 99 mg/dL 578 (HH) 495 (H)  10 units Novolog  201 (H)   8 units Semglee 252 (H)     Admit with: Intractable Nausea and Vomiting/ Hyperkalemia   History: Type 1 diabetes, Dementia, CVA, Pancreatic cancer status post pancreatectomy and splenectomy   Home DM Meds: Lantus 8 units Daily                             Humalog 0-9 units TID AC + HS per SSI   Current Orders: Semglee 8 units QHS                            Novolog Moderate Correction Scale/ SSI (0-15 units) TID AC + HS                                  Well known to the Inpatient Diabetes Team Multiple admissions--10th admission since January 2023 Just discharged home 08/13   Pt well known to the diabetes team.  She tends to have extremely labile CBGs in the hospital.  From previous admissions, she will eat in between meals, and when she does not get Novolog coverage for her food intake, she will spike very high.  Then we have to be cautious not to over-medicate her with too much insulin b/c she has the tendency to bottom out with large doses of Novolog.       --Will follow patient during hospitalization--  Wyn Quaker RN, MSN, [place] Diabetes Coordinator Inpatient Glycemic Control Team Team Pager: [phone] (8a-5p)

## 2021-09-26 NOTE — Progress Notes (Signed)
Rapid Response Event Note   Reason for Call : elevated blood sugar   Initial Focused Assessment: On my arrival pt is sitting on edge of bed in paper scrubs. She is asking if she can walk around unit. Primary RN states that pt's CBG at 5pm was 585 and 10 units of insulin was given per Dr. Posey Pronto. When CBG rechecked at 6:30pm the glucometer read HIGH. Rapid response was called. Pt is confused but can answer some questions appropriately and follow commands. Primary RN states that pt has also been refusing CBG checks at times. VSS.   Interventions: Dr. Posey Pronto notified and aware. Scheduled 15 units insulin per sliding scale was administered. CBC and glucose was ordered. CBG rechecked about 15 minutes after sliding scale insulin given and still reading HIGH.   Plan of Care: Pt will remain on 1C at this time. Waiting on CBC and glucose results.   Event Summary:   MD Notified: Dr. Posey Pronto Call Time: 1838 Arrival Time: [place] Time: 1905  Trellis Paganini, RN

## 2021-09-27 DIAGNOSIS — G4700 Insomnia, unspecified: Secondary | ICD-10-CM | POA: Diagnosis present

## 2021-09-27 DIAGNOSIS — L89152 Pressure ulcer of sacral region, stage 2: Secondary | ICD-10-CM

## 2021-09-27 DIAGNOSIS — G2581 Restless legs syndrome: Secondary | ICD-10-CM | POA: Diagnosis present

## 2021-09-27 DIAGNOSIS — K8689 Other specified diseases of pancreas: Secondary | ICD-10-CM | POA: Diagnosis not present

## 2021-09-27 DIAGNOSIS — G9341 Metabolic encephalopathy: Secondary | ICD-10-CM | POA: Diagnosis present

## 2021-09-27 DIAGNOSIS — E785 Hyperlipidemia, unspecified: Secondary | ICD-10-CM | POA: Diagnosis present

## 2021-09-27 DIAGNOSIS — E871 Hypo-osmolality and hyponatremia: Secondary | ICD-10-CM

## 2021-09-27 DIAGNOSIS — M329 Systemic lupus erythematosus, unspecified: Secondary | ICD-10-CM | POA: Diagnosis present

## 2021-09-27 DIAGNOSIS — G40909 Epilepsy, unspecified, not intractable, without status epilepticus: Secondary | ICD-10-CM | POA: Diagnosis present

## 2021-09-27 DIAGNOSIS — F0393 Unspecified dementia, unspecified severity, with mood disturbance: Secondary | ICD-10-CM | POA: Diagnosis present

## 2021-09-27 DIAGNOSIS — E891 Postprocedural hypoinsulinemia: Secondary | ICD-10-CM | POA: Diagnosis present

## 2021-09-27 DIAGNOSIS — F17210 Nicotine dependence, cigarettes, uncomplicated: Secondary | ICD-10-CM | POA: Diagnosis present

## 2021-09-27 DIAGNOSIS — Z8673 Personal history of transient ischemic attack (TIA), and cerebral infarction without residual deficits: Secondary | ICD-10-CM | POA: Diagnosis not present

## 2021-09-27 DIAGNOSIS — E1169 Type 2 diabetes mellitus with other specified complication: Secondary | ICD-10-CM | POA: Diagnosis present

## 2021-09-27 DIAGNOSIS — F419 Anxiety disorder, unspecified: Secondary | ICD-10-CM

## 2021-09-27 DIAGNOSIS — E43 Unspecified severe protein-calorie malnutrition: Secondary | ICD-10-CM | POA: Diagnosis present

## 2021-09-27 DIAGNOSIS — I10 Essential (primary) hypertension: Secondary | ICD-10-CM | POA: Diagnosis present

## 2021-09-27 DIAGNOSIS — Z794 Long term (current) use of insulin: Secondary | ICD-10-CM

## 2021-09-27 DIAGNOSIS — F0394 Unspecified dementia, unspecified severity, with anxiety: Secondary | ICD-10-CM | POA: Diagnosis present

## 2021-09-27 DIAGNOSIS — F329 Major depressive disorder, single episode, unspecified: Secondary | ICD-10-CM | POA: Diagnosis present

## 2021-09-27 DIAGNOSIS — E1165 Type 2 diabetes mellitus with hyperglycemia: Secondary | ICD-10-CM | POA: Diagnosis present

## 2021-09-27 DIAGNOSIS — E11649 Type 2 diabetes mellitus with hypoglycemia without coma: Secondary | ICD-10-CM

## 2021-09-27 DIAGNOSIS — Z5900 Homelessness unspecified: Secondary | ICD-10-CM | POA: Diagnosis not present

## 2021-09-27 DIAGNOSIS — Z433 Encounter for attention to colostomy: Secondary | ICD-10-CM | POA: Diagnosis present

## 2021-09-27 DIAGNOSIS — Z933 Colostomy status: Secondary | ICD-10-CM | POA: Diagnosis not present

## 2021-09-27 DIAGNOSIS — F32A Depression, unspecified: Secondary | ICD-10-CM

## 2021-09-27 DIAGNOSIS — J449 Chronic obstructive pulmonary disease, unspecified: Secondary | ICD-10-CM | POA: Diagnosis present

## 2021-09-27 DIAGNOSIS — F03918 Unspecified dementia, unspecified severity, with other behavioral disturbance: Secondary | ICD-10-CM | POA: Diagnosis present

## 2021-09-27 DIAGNOSIS — E875 Hyperkalemia: Secondary | ICD-10-CM | POA: Diagnosis present

## 2021-09-27 LAB — BASIC METABOLIC PANEL
Anion gap: 9 (ref 5–15)
BUN: 22 mg/dL (ref 8–23)
CO2: 20 mmol/L — ABNORMAL LOW (ref 22–32)
Calcium: 8.9 mg/dL (ref 8.9–10.3)
Chloride: 98 mmol/L (ref 98–111)
Creatinine, Ser: 0.88 mg/dL (ref 0.44–1.00)
GFR, Estimated: >60 mL/min (ref >60)
Glucose, Bld: 418 mg/dL — ABNORMAL HIGH (ref 70–99)
Potassium: 5 mmol/L (ref 3.5–5.1)
Sodium: 127 mmol/L — ABNORMAL LOW (ref 135–145)

## 2021-09-27 LAB — GLUCOSE, CAPILLARY
Glucose-Capillary: 66 mg/dL — ABNORMAL LOW (ref 70–99)
Glucose-Capillary: 137 mg/dL — ABNORMAL HIGH (ref 70–99)
Glucose-Capillary: 42 mg/dL — CL (ref 70–99)
Glucose-Capillary: 283 mg/dL — ABNORMAL HIGH (ref 70–99)
Glucose-Capillary: 335 mg/dL — ABNORMAL HIGH (ref 70–99)
Glucose-Capillary: 146 mg/dL — ABNORMAL HIGH (ref 70–99)
Glucose-Capillary: 157 mg/dL — ABNORMAL HIGH (ref 70–99)
Glucose-Capillary: 265 mg/dL — ABNORMAL HIGH (ref 70–99)

## 2021-09-27 MED ORDER — GLUCOSE 40 % PO GEL
1.0000 | Freq: Once | ORAL | Status: DC | PRN
  Administered 2021-09-27: 31 g via ORAL

## 2021-09-27 MED ORDER — PROSOURCE PLUS PO LIQD
30.0000 mL | Freq: Three times a day (TID) | ORAL | Status: DC
  Administered 2021-09-27 – 2021-09-30 (×11): 30 mL via ORAL
  Filled 2021-09-27 (×14): qty 30

## 2021-09-27 MED ORDER — GLUCOSE 40 % PO GEL
ORAL | Status: AC
  Filled 2021-09-27: qty 1

## 2021-09-27 MED ORDER — INSULIN ASPART 100 UNIT/ML IJ SOLN
3.0000 [IU] | Freq: Three times a day (TID) | INTRAMUSCULAR | Status: DC
  Administered 2021-09-27 – 2021-09-29 (×8): 3 [IU] via SUBCUTANEOUS
  Filled 2021-09-27 (×8): qty 1

## 2021-09-27 MED ORDER — QUETIAPINE FUMARATE 25 MG PO TABS
25.0000 mg | ORAL_TABLET | Freq: Every day | ORAL | Status: DC
  Administered 2021-09-27 – 2021-10-04 (×7): 25 mg via ORAL
  Filled 2021-09-27 (×7): qty 1

## 2021-09-27 MED ORDER — BOOST / RESOURCE BREEZE PO LIQD CUSTOM
1.0000 | Freq: Three times a day (TID) | ORAL | Status: DC
  Administered 2021-09-27 – 2021-09-30 (×10): 1 via ORAL

## 2021-09-27 NOTE — Inpatient Diabetes Management (Signed)
Inpatient Diabetes Program Recommendations  AACE/ADA: New Consensus Statement on Inpatient Glycemic Control (2015)  Target Ranges:  Prepandial:   less than 140 mg/dL      Peak postprandial:   less than 180 mg/dL (1-2 hours)      Critically ill patients:  140 - 180 mg/dL   Lab Results  Component Value Date   GLUCAP 283 (H) 09/27/2021   HGBA1C 15.0 (H) 09/04/2021    Latest Reference Range & Units 09/26/21 18:32 09/26/21 19:00 09/26/21 20:57 09/26/21 22:55 09/27/21 01:58 09/27/21 02:36 09/27/21 03:24 09/27/21 06:26  Glucose-Capillary 70 - 99 mg/dL >600 (HH) Novolog 15 units >600 (HH) >600 (HH) Novolog 20 units  Novolog 15 units  421 (H) 66 (L) 42 (LL) Dextrose 31 gm 137 (H) 283 (H)    Admit with: Intractable Nausea and Vomiting/ Hyperkalemia   History: Type 1 diabetes, Dementia, CVA, Pancreatic cancer status post pancreatectomy and splenectomy   Home DM Meds: Lantus 8 units Daily                             Humalog 0-9 units TID AC + HS per SSI   Current Orders: Semglee 8 units QHS                            Novolog Moderate Correction Scale/ SSI (0-15 units) TID AC + HS  Inpatient Diabetes Program Recommendations:   Patient had hypoglycemia post correction for hyperglycemia. Patient refused CBG check and insulin correction yesterday @ noon then CBGs elevated >600 per meter.  Please consider: -Novolog 3 units TID with meals for meal coverage if patient eats at least 50% of meals.  "Pt well known to the diabetes team.  She tends to have extremely labile CBGs in the hospital.  From previous admissions, she will eat in between meals, and when she does not get Novolog coverage for her food intake, she will spike very high.  Then we have to be cautious not to over-medicate her with too much insulin b/c she has the tendency to bottom out with large doses of Novolog."  Thank you, Nani Gasser. Janyiah Silveri, RN, MSN, CDE  Diabetes Coordinator Inpatient Glycemic Control Team Team Pager  [phone] (8am-5pm) 09/27/2021 8:16 AM

## 2021-09-27 NOTE — Progress Notes (Signed)
Progress Note   [name] DOB: September 14, 1960 DOA: 09/25/2021     0 DOS: the patient was seen and examined on 09/27/2021   Brief hospital course: 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  Initial vitals in the emergency department showed temperature of 98.6, respiration rate 18, heart rate of 91, blood pressure 129/88, SPO2 of 98% on room air.  Serum sodium is 128, potassium 6.3, chloride of 100, bicarb 20, BUN of 25, serum creatinine of 0.99, GFR greater than 60, nonfasting blood glucose 568, WBC 7.1, hemoglobin 10.8, platelets of 449.  High sensitive troponin was 10.  With the patient not sleeping very well I will change the Zyprexa at night over to Seroquel.  Assessment and Plan: * Hyperkalemia Treated last night.  Potassium 5.0 today.  We will check again tomorrow morning.  Hyponatremia Sodium 127 yesterday and again today.  Likely secondary to labile high sugars.  Continue to monitor closely.  Uncontrolled type 2 diabetes mellitus with hypoglycemia, with long-term current use of insulin ([place]) Sugars went up to 700 yesterday and also down to 46.  Patient with hyperglycemia and hypoglycemia.  Very labile.  Case discussed with diabetes coordinator.  Patient on Semglee 8 units at night and 3 units plus sliding scale prior to meals.  Continue Lipitor.  Type 2 diabetes mellitus with hyperlipidemia (HCC) Continue Semglee insulin 8 units at bedtime with 3 units prior to meals plus sliding scale.  Continue Lipitor.  History of stroke - Atorvastatin 40 mg, aspirin 81 mg resumed  Anxiety and depression - Mirtazapine 15 mg nightly resumed  Restless leg syndrome - Ropinirole 0.5 mg nightly resumed  HLD  (hyperlipidemia) - Atorvastatin 40 mg nightly resume  Protein-calorie malnutrition, severe - Mirtazapine 15 mg nightly resumed - Dietary has been consulted  Status post colostomy ([place]) - Colostomy bag in place - Wound/ostomy care ordered  Insomnia Trial of Seroquel at night  Decubital ulcer Coccyx stage II, present on admission.  See full description below  Tobacco dependence - Nicotine patch as needed ordered   DSS working on placement.     Subjective: Patient seen this morning.  States that she sometimes gets dizzy.  She states that she is hungry and she needs double portions of food.  DSS involved in this patient's case and she is an unsafe discharge home and they were working on placement for her.  Patient not sleeping very well.  Initially admitted with intractable nausea and vomiting.  Physical Exam: Vitals:   09/26/21 1839 09/26/21 2023 09/27/21 0351 09/27/21 0753  BP: (!) 145/104 109/77 96/78 (!) 127/93  Pulse: 84 92 69 96  Resp:  '18 18 18  '$ Temp:   98.4 F (36.9 C) 98.3 F (36.8 C)  TempSrc:      SpO2: 100% 100% 98% 100%  Weight:      Height:       Physical Exam HENT:     Head: Normocephalic.     Mouth/Throat:     Pharynx: No oropharyngeal exudate.  Eyes:     General: Lids are normal.     Conjunctiva/sclera: Conjunctivae normal.  Cardiovascular:     Rate and Rhythm: Normal rate and regular rhythm.     Heart sounds: Normal heart sounds, S1 normal and S2 normal.  Pulmonary:  Breath sounds: No decreased breath sounds, wheezing, rhonchi or rales.  Abdominal:     Palpations: Abdomen is soft.     Tenderness: There is no abdominal tenderness.  Musculoskeletal:     Right lower leg: No swelling.     Left lower leg: No swelling.  Skin:    General: Skin is warm.     Findings: No rash.  Neurological:     Mental Status: She is alert and oriented to person, place, and time.     Data Reviewed: Sodium 127, glucose 418, potassium 5.0, creatinine  0.88  Family Communication: Left message for patient's daughter  Disposition: Status is: Inpatient Remains inpatient appropriate because: DSS looking for a place for the patient to stay.  Unsafe discharge home. Planned Discharge Destination: Possibly group home    Time spent: 28 minutes  Author: Loletha Grayer, MD 09/27/2021 3:41 PM  For on call review [email or web address].
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
## 2021-09-27 NOTE — Progress Notes (Signed)
Nutrition Follow-up  DOCUMENTATION CODES:   Severe malnutrition in context of chronic illness  INTERVENTION:   -Liberalize diet to carb modified -Boost Breeze po TID, each supplement provides 250 kcal and 9 grams of protein  -30 ml Prosource Plus TID, each supplement provides 100 kcals and 15 grams protein -MVI with minerals daily -Double protein portions with meals  NUTRITION DIAGNOSIS:   Severe Malnutrition related to chronic illness (pancreatic cancer) as evidenced by moderate fat depletion, severe fat depletion, moderate muscle depletion, severe muscle depletion.  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  Case discussed with nutritional services department; pt is requesting extra portions. RD will liberalize diet to carb modified and add extra portion portions to help with hunger and blood sugar management.   Pt does not like milky supplements due to lactose intolerance and has drank Boost Breeze in the past.   Noted pt has been refusing CBGS checks and insulin.   Medications reviewed and include remeron.   Discharge cancelled yesterday due to protective order from [place].   Labs reviewed: Na: 127, K: 5.8, CBGS: 42-335 (inpatient orders for glycemic control are 0-15 units insulin aspart TID with meals, 0-5 units insulin aspart TID with meals, 0-5 units insulin aspart daily at bedtime, 3 units insulin aspart TID with meals, and 8 units insulin glargine-yfgn daily).    Diet Order:   Diet Order             Diet Carb Modified Fluid  consistency: Thin; Room service appropriate? Yes  Diet effective now           Diet Carb Modified                   EDUCATION NEEDS:   No education needs have been identified at this time  Skin:  Skin Assessment: Skin Integrity Issues: Skin Integrity Issues:: Stage II Stage II: coccyx Stage III: -  Last BM:  09/26/21 (300 ml via colostomy)  Height:   Ht Readings from Last 1 Encounters:  09/25/21 5' (1.524 m)    Weight:   Wt Readings from Last 1 Encounters:  09/25/21 47 kg    Ideal Body Weight:  45.5 kg  BMI:  Body mass index is 20.24 kg/m.  Estimated Nutritional Needs:   Kcal:  1700-1900  Protein:  90-105 grams  Fluid:  > 1.7 L    Loistine Chance, RD, LDN, [place] Registered Dietitian II Certified Diabetes Care and Education Specialist Please refer to [facility] for RD and/or RD on-call/weekend/after hours pager

## 2021-09-27 NOTE — Assessment & Plan Note (Addendum)
Improved.  This is likely pseudohyponatremia secondary to high sugars.

## 2021-09-27 NOTE — Assessment & Plan Note (Addendum)
Blood sugar control remains big challenge.  They remain labile ranging from 80s to 450s.  Patient also has very specific diet request which makes it challenging

## 2021-09-27 NOTE — Progress Notes (Signed)
Mobility Specialist - Progress Note     09/27/21 0912  Mobility  Activity Ambulated with assistance in hallway  Level of Assistance Contact guard assist, steadying assist  Assistive Device None  Distance Ambulated (ft) 340 ft  Activity Response Tolerated well  $Mobility charge 1 Mobility   Pt standing w/ nurse in hallway upon arrival. Pt utilizing RA. Pt ambulated 2 laps around NS. Pt left EOB with needs in reach. Nurse present at bedside upon exit. No complaints.  [place] Mobility Specialist 09/27/21 9:15 AM

## 2021-09-27 NOTE — Assessment & Plan Note (Signed)
Coccyx stage II, present on admission.  See full description below
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
## 2021-09-27 NOTE — Assessment & Plan Note (Addendum)
Continue Seroquel at night

## 2021-09-27 NOTE — Plan of Care (Signed)

## 2021-09-28 DIAGNOSIS — R000 Tachycardia, unspecified: Secondary | ICD-10-CM

## 2021-09-28 DIAGNOSIS — E11649 Type 2 diabetes mellitus with hypoglycemia without coma: Secondary | ICD-10-CM | POA: Diagnosis not present

## 2021-09-28 DIAGNOSIS — E43 Unspecified severe protein-calorie malnutrition: Secondary | ICD-10-CM

## 2021-09-28 DIAGNOSIS — E871 Hypo-osmolality and hyponatremia: Secondary | ICD-10-CM | POA: Diagnosis not present

## 2021-09-28 DIAGNOSIS — E1169 Type 2 diabetes mellitus with other specified complication: Secondary | ICD-10-CM | POA: Diagnosis not present

## 2021-09-28 DIAGNOSIS — E875 Hyperkalemia: Secondary | ICD-10-CM | POA: Diagnosis not present

## 2021-09-28 DIAGNOSIS — G4700 Insomnia, unspecified: Secondary | ICD-10-CM

## 2021-09-28 DIAGNOSIS — G2581 Restless legs syndrome: Secondary | ICD-10-CM

## 2021-09-28 LAB — BASIC METABOLIC PANEL
Anion gap: 9 (ref 5–15)
BUN: 23 mg/dL (ref 8–23)
CO2: 22 mmol/L (ref 22–32)
Calcium: 8.8 mg/dL — ABNORMAL LOW (ref 8.9–10.3)
Chloride: 101 mmol/L (ref 98–111)
Creatinine, Ser: 0.88 mg/dL (ref 0.44–1.00)
GFR, Estimated: >60 mL/min (ref >60)
Glucose, Bld: 434 mg/dL — ABNORMAL HIGH (ref 70–99)
Potassium: 5.2 mmol/L — ABNORMAL HIGH (ref 3.5–5.1)
Sodium: 132 mmol/L — ABNORMAL LOW (ref 135–145)

## 2021-09-28 LAB — GLUCOSE, CAPILLARY
Glucose-Capillary: 397 mg/dL — ABNORMAL HIGH (ref 70–99)
Glucose-Capillary: 382 mg/dL — ABNORMAL HIGH (ref 70–99)
Glucose-Capillary: 341 mg/dL — ABNORMAL HIGH (ref 70–99)
Glucose-Capillary: 101 mg/dL — ABNORMAL HIGH (ref 70–99)
Glucose-Capillary: 402 mg/dL — ABNORMAL HIGH (ref 70–99)

## 2021-09-28 MED ORDER — TUBERCULIN PPD 5 UNIT/0.1ML ID SOLN
5.0000 [IU] | Freq: Once | INTRADERMAL | Status: AC
  Administered 2021-09-28: 5 [IU] via INTRADERMAL
  Filled 2021-09-28: qty 0.1

## 2021-09-28 MED ORDER — SODIUM ZIRCONIUM CYCLOSILICATE 10 G PO PACK
10.0000 g | PACK | Freq: Once | ORAL | Status: AC
Start: 2021-09-28 — End: 2021-09-28
  Administered 2021-09-28: 10 g via ORAL
  Filled 2021-09-28: qty 1

## 2021-09-28 MED ORDER — DILTIAZEM HCL ER COATED BEADS 120 MG PO CP24
120.0000 mg | ORAL_CAPSULE | Freq: Every day | ORAL | Status: DC
  Administered 2021-09-28 – 2021-10-02 (×5): 120 mg via ORAL
  Filled 2021-09-28 (×5): qty 1

## 2021-09-28 NOTE — Progress Notes (Signed)
Progress Note   [name] DOB: 15-Jan-1961 DOA: 09/25/2021     1 DOS: the patient was seen and examined on 09/28/2021   Brief hospital course: 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  Initial vitals in the emergency department showed temperature of 98.6, respiration rate 18, heart rate of 91, blood pressure 129/88, SPO2 of 98% on room air.  Serum sodium is 128, potassium 6.3, chloride of 100, bicarb 20, BUN of 25, serum creatinine of 0.99, GFR greater than 60, nonfasting blood glucose 568, WBC 7.1, hemoglobin 10.8, platelets of 449.  High sensitive troponin was 10.  The patient slept better with Seroquel.  I will continue this medications.  Assessment and Plan: * Hyperkalemia Patient with a potassium of 5.2 today.  Given a dose of Lokelma.  Recheck again tomorrow.  Hyponatremia Sodium 132.  Likely low secondary to pseudo hyponatremia with high sugars.  Uncontrolled type 2 diabetes mellitus with hypoglycemia, with long-term current use of insulin ([name]) Sugars went up to 700 2 days ago and also down to 46.  Patient with hyperglycemia and hypoglycemia.  Very labile.  Continue patient on Semglee 8 units at night and 3 units plus sliding scale prior to meals.  Continue Lipitor.  Type 2 diabetes mellitus with hyperlipidemia (HCC) Continue Semglee insulin 8 units at bedtime with 3 units prior to meals plus sliding scale.  Continue Lipitor.   History of stroke - Atorvastatin 40 mg, aspirin 81 mg resumed  Anxiety and depression - Mirtazapine 15 mg nightly resumed  Restless leg syndrome - Ropinirole 0.5 mg nightly resumed  HLD (hyperlipidemia) - Atorvastatin 40 mg nightly resume  Protein-calorie  malnutrition, severe - Mirtazapine 15 mg nightly resumed - Dietary has been consulted  Status post colostomy (Brittany [name]) - Colostomy bag in place - Wound/ostomy care ordered  Tachycardia We will start low-dose Cardizem CD.  Insomnia Trial of Seroquel at night  Decubital ulcer Coccyx stage II, present on admission.  See full description below  Tobacco dependence - Nicotine patch as needed ordered        Subjective: Patient states that she was hungry this morning when I saw her she was eating her second breakfast and just shoveling it in with her fingers.  Patient feels okay and offers no complaints.  Initially came in with nausea vomiting  Physical Exam: Vitals:   09/27/21 2001 09/28/21 0509 09/28/21 0820 09/28/21 1636  BP: 110/79 133/86 (!) 122/95 112/83  Pulse: (!) 108 97 (!) 109 92  Resp: '16 16 16 18  '$ Temp: 98.2 F (36.8 C) 98.8 F (37.1 C) 99 F (37.2 C) 99 F (37.2 C)  TempSrc: Oral  Oral Oral  SpO2: 95% 97% 99% 99%  Weight:      Height:       Physical Exam HENT:     Head: Normocephalic.     Mouth/Throat:     Pharynx: No oropharyngeal exudate.  Eyes:     General: Lids are normal.     Conjunctiva/sclera: Conjunctivae normal.  Cardiovascular:     Rate and Rhythm: Regular rhythm. Tachycardia present.     Heart sounds: Normal heart sounds, S1 normal and S2 normal.  Pulmonary:     Breath sounds: No decreased breath sounds, wheezing, rhonchi or rales.  Abdominal:  Palpations: Abdomen is soft.     Tenderness: There is no abdominal tenderness.  Musculoskeletal:     Right lower leg: No swelling.     Left lower leg: No swelling.  Skin:    General: Skin is warm.     Findings: No rash.  Neurological:     Mental Status: She is alert and oriented to person, place, and time.     Data Reviewed: Sodium 132, potassium 5.2, creatinine 0.88  Family Communication: Updated patient's daughter on the phone  Disposition: Status is: Inpatient Remains inpatient  appropriate because: DSS working on placement options.  PPD ordered for today  Planned Discharge Destination: To be determined based on DSS    Time spent: 28 minutes  Author: Loletha Grayer, MD 09/28/2021 4:50 PM  For on call review [email or web address].

## 2021-09-28 NOTE — Progress Notes (Signed)
TB test administered at 1325 8/17 Right forearm. Area marked and dated by RN.

## 2021-09-28 NOTE — Assessment & Plan Note (Addendum)
For better heart rate control, will resume Cardizem CD

## 2021-09-28 NOTE — TOC Progression Note (Signed)
Transition of Care [facility]) - Progression Note    Patient Details  Name: Brittany [name] MRN: [id number] Date of Birth: 1961/01/06  Transition of Care [facility]) CM/SW [place], [place] Phone Number: 09/28/2021, 2:52 PM  Clinical Narrative:     CSW spoke with [place] social worker at [phone] who requested TB result, MD made aware. Alijah made aware patient will need to be assessed for ppd results at facility as this could take 2 days, she expressed understanding and reports she will call this CSW back with patient's discharge plan.   Expected Discharge Plan: [place] Barriers to Discharge: Barriers Resolved  Expected Discharge Plan and Services Expected Discharge Plan: [place]   Discharge Planning Services: CM Consult Post Acute Care Choice: [place] arrangements for the past 2 months: Single Family Home Expected Discharge Date: 09/26/21                         HH Arranged: RN, PT, OT, Nurse's Aide [place] Agency: [place]), [place] Date [facility] Agency Contacted: 09/26/21   Representative spoke with at [place]: cheryl   Social Determinants of Health (SDOH) Interventions    Readmission Risk Interventions    09/22/2021    1:24 PM 03/06/2021    9:13 AM 02/16/2021    2:45 PM  Readmission Risk Prevention Plan  Transportation Screening Complete Complete Complete  Medication Review Press photographer) Complete Complete Complete  PCP or Specialist appointment within 3-5 days of discharge Complete Complete   HRI or [place] Complete    SW Recovery Care/Counseling Consult Complete  Complete  Palliative Care Screening Not Applicable Not Applicable Not [place] Complete Not Applicable Not Applicable

## 2021-09-28 NOTE — Plan of Care (Signed)

## 2021-09-29 DIAGNOSIS — G40909 Epilepsy, unspecified, not intractable, without status epilepticus: Secondary | ICD-10-CM

## 2021-09-29 DIAGNOSIS — E11649 Type 2 diabetes mellitus with hypoglycemia without coma: Secondary | ICD-10-CM | POA: Diagnosis not present

## 2021-09-29 DIAGNOSIS — E875 Hyperkalemia: Secondary | ICD-10-CM | POA: Diagnosis not present

## 2021-09-29 DIAGNOSIS — E871 Hypo-osmolality and hyponatremia: Secondary | ICD-10-CM | POA: Diagnosis not present

## 2021-09-29 DIAGNOSIS — K8689 Other specified diseases of pancreas: Secondary | ICD-10-CM

## 2021-09-29 DIAGNOSIS — R000 Tachycardia, unspecified: Secondary | ICD-10-CM

## 2021-09-29 DIAGNOSIS — Z8673 Personal history of transient ischemic attack (TIA), and cerebral infarction without residual deficits: Secondary | ICD-10-CM | POA: Diagnosis not present

## 2021-09-29 LAB — BASIC METABOLIC PANEL
Anion gap: 8 (ref 5–15)
BUN: 21 mg/dL (ref 8–23)
CO2: 21 mmol/L — ABNORMAL LOW (ref 22–32)
Calcium: 8.8 mg/dL — ABNORMAL LOW (ref 8.9–10.3)
Chloride: 102 mmol/L (ref 98–111)
Creatinine, Ser: 0.78 mg/dL (ref 0.44–1.00)
GFR, Estimated: >60 mL/min (ref >60)
Glucose, Bld: 444 mg/dL — ABNORMAL HIGH (ref 70–99)
Potassium: 5 mmol/L (ref 3.5–5.1)
Sodium: 131 mmol/L — ABNORMAL LOW (ref 135–145)

## 2021-09-29 LAB — GLUCOSE, CAPILLARY
Glucose-Capillary: 342 mg/dL — ABNORMAL HIGH (ref 70–99)
Glucose-Capillary: 296 mg/dL — ABNORMAL HIGH (ref 70–99)
Glucose-Capillary: 114 mg/dL — ABNORMAL HIGH (ref 70–99)
Glucose-Capillary: 482 mg/dL — ABNORMAL HIGH (ref 70–99)

## 2021-09-29 MED ORDER — INSULIN ASPART 100 UNIT/ML IJ SOLN
10.0000 [IU] | Freq: Once | INTRAMUSCULAR | Status: AC
  Administered 2021-09-29: 10 [IU] via SUBCUTANEOUS
  Filled 2021-09-29: qty 1

## 2021-09-29 MED ORDER — SODIUM ZIRCONIUM CYCLOSILICATE 10 G PO PACK
10.0000 g | PACK | Freq: Once | ORAL | Status: AC
  Administered 2021-09-29: 10 g via ORAL
  Filled 2021-09-29: qty 1

## 2021-09-29 NOTE — TOC Progression Note (Signed)
Transition of Care [facility]) - Progression Note    Patient Details  Name: Brittany [name] MRN: [id number] Date of Birth: 1960/11/13  Transition of Care [facility]) CM/SW [place], [place] Phone Number: 09/29/2021, 1:56 PM  Clinical Narrative:     Fortunato Curling reviewing, Neoma Laming at [facility]) requested CSW try to start [name] while their business office looks like patient's medicaid. CSW has started [name] for [employer] pending approval at this time.    Expected Discharge Plan: [place] Barriers to Discharge: Barriers Resolved  Expected Discharge Plan and Services Expected Discharge Plan: [place]   Discharge Planning Services: CM Consult Post Acute Care Choice: [place] arrangements for the past 2 months: Single Family Home Expected Discharge Date: 09/26/21                         HH Arranged: RN, PT, OT, Nurse's Aide [place] Agency: [place] ([place]), [place] Date [facility] Agency Contacted: 09/26/21   Representative spoke with at [place]: cheryl   Social Determinants of Health (SDOH) Interventions    Readmission Risk Interventions    09/22/2021    1:24 PM 03/06/2021    9:13 AM 02/16/2021    2:45 PM  Readmission Risk Prevention Plan  Transportation Screening Complete Complete Complete  Medication Review Press photographer) Complete Complete Complete  PCP or Specialist appointment within 3-5 days of discharge Complete Complete   HRI or [place] Complete    SW Recovery Care/Counseling Consult Complete  Complete  Palliative Care Screening Not Applicable Not Applicable Not [place] Complete Not Applicable Not Applicable

## 2021-09-29 NOTE — Assessment & Plan Note (Addendum)
Seen on prior imaging of the abdomen.  Outpatient follow-up with her oncologist.  Keep appointment with Dr. Zenia Resides at [facility] on 11/24/2021

## 2021-09-29 NOTE — TOC Progression Note (Addendum)
Transition of Care [facility]) - Progression Note    Patient Details  Name: Brittany [name] MRN: [id number] Date of Birth: 06-Nov-1960  Transition of Care [facility]) CM/SW [place], [place] Phone Number: 09/29/2021, 9:43 AM  Clinical Narrative:     CSW notes TB test ordered 8/17 per [place] social worker at [phone] request.  Vernon Prey reports today that someone will come do an assessment on patient and requested patient's room number.   Frederik Pear with DSS has requested updated snf fl2 be emailed to [email or web address] this has been completed.   CSW also spoke with Abigail Butts with DSS at [phone] she requested fl2 also be sent to her at [email or web address] this has been sent.      Expected Discharge Plan: [place] Barriers to Discharge: Barriers Resolved  Expected Discharge Plan and Services Expected Discharge Plan: [place]   Discharge Planning Services: CM Consult Post Acute Care Choice: [place] arrangements for the past 2 months: Single Family Home Expected Discharge Date: 09/26/21                         HH Arranged: RN, PT, OT, Nurse's Aide [place] Agency: [place] ([place]), [place] Date [facility] Agency Contacted: 09/26/21   Representative spoke with at [place]: cheryl   Social Determinants of Health (SDOH) Interventions    Readmission Risk Interventions    09/22/2021    1:24 PM 03/06/2021    9:13 AM 02/16/2021    2:45 PM  Readmission Risk Prevention Plan  Transportation Screening Complete Complete Complete  Medication Review Press photographer) Complete Complete Complete  PCP or Specialist appointment within 3-5 days of discharge Complete Complete   HRI or [place] Complete    SW Recovery Care/Counseling Consult Complete  Complete  Palliative Care Screening Not Applicable Not Applicable Not [place] Complete Not  Applicable Not Applicable

## 2021-09-29 NOTE — Care Management Important Message (Signed)
Important Message  Patient Details  Name: Brittany [name] MRN: [id number] Date of Birth: [birth year]   Medicare Important Message Given:  N/A - LOS <3 / Initial given by admissions     Juliann Pulse A Jessina Marse 09/29/2021, 9:19 AM

## 2021-09-29 NOTE — Progress Notes (Signed)
Progress Note   [name] FIE:[id number] DOB: April 18, 1960 DOA: 09/25/2021     2 DOS: the patient was seen and examined on 09/29/2021   Brief hospital course: Ms. 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  Initial vitals in the emergency department showed temperature of 98.6, respiration rate 18, heart rate of 91, blood pressure 129/88, SPO2 of 98% on room air.  Serum sodium is 128, potassium 6.3, chloride of 100, bicarb 20, BUN of 25, serum creatinine of 0.99, GFR greater than 60, nonfasting blood glucose 568, WBC 7.1, hemoglobin 10.8, platelets of 449.  High sensitive troponin was 10.  Patient sleeping better on Seroquel. Treating hyperkalemia during the hospital course  Assessment and Plan: * Hyperkalemia Potassium 5.0 today.  Low potassium diet ordered.  We will give another dose of Lokelma and recheck tomorrow.  Hyponatremia Sodium 131.  Likely low secondary to pseudo hyponatremia with high sugars.  Uncontrolled type 2 diabetes mellitus with hypoglycemia, with long-term current use of insulin (HCC) Very labile sugars continue Semglee insulin 8 units plus sliding scale and standing dose short acting insulin prior to meals.  Hemoglobin A1c very elevated at 15.  Continue Lipitor.  Type 2 diabetes mellitus with hyperlipidemia (HCC) Continue Semglee insulin 8 units at bedtime with 3 units prior to meals plus sliding scale.  Continue Lipitor.   History of stroke Continue atorvastatin 40 mg, aspirin 81 mg resumed  Anxiety and depression - Mirtazapine 15 mg nightly resumed  Restless leg syndrome - Ropinirole 0.5 mg nightly resumed  HLD (hyperlipidemia) - Atorvastatin 40 mg nightly  Protein-calorie malnutrition,  severe - Mirtazapine 15 mg nightly resumed - Dietary has been consulted  Status post colostomy ([place]) - Colostomy bag in place - Wound/ostomy care ordered  Tachycardia Continue low-dose Cardizem CD.  Insomnia Trial of Seroquel at night  Decubital ulcer Coccyx stage II, present on admission.  See full description below  Pancreatic mass Seen on prior imaging of the abdomen.  We will send off a CA 19-9.  Will need to follow-up with oncology as outpatient.  Tobacco dependence - Nicotine patch as needed ordered        Subjective: Patient awakened from the sleep.  She was sleeping on her abdomen halfway off the bed.  She sat right up to eat breakfast.  Her ostomy must of come loose because there was stool over the bed and on her gown.  Patient offers no complaints except for being hungry.  Initially came in with nausea vomiting.  Physical Exam: Vitals:   09/28/21 1636 09/28/21 2012 09/29/21 0354 09/29/21 0804  BP: 112/83 (!) 116/92 98/68 101/78  Pulse: 92 95 99 (!) 105  Resp: '18 16 18 18  '$ Temp: 99 F (37.2 C) 97.8 F (36.6 C) 98.4 F (36.9 C) 98.8 F (37.1 C)  TempSrc: Oral Oral  Oral  SpO2: 99% 99% 97% 97%  Weight:      Height:       Physical Exam HENT:     Head: Normocephalic.     Mouth/Throat:     Pharynx: No oropharyngeal exudate.  Eyes:     General: Lids are normal.     Conjunctiva/sclera: Conjunctivae normal.  Cardiovascular:     Rate and Rhythm: Normal rate and regular rhythm.     Heart sounds:  Normal heart sounds, S1 normal and S2 normal.  Pulmonary:     Breath sounds: No decreased breath sounds, wheezing, rhonchi or rales.  Abdominal:     Comments: Ostomy came loose and there was stool on the inside of her count.  Musculoskeletal:     Right lower leg: No swelling.     Left lower leg: No swelling.  Skin:    General: Skin is warm.     Findings: No rash.  Neurological:     Mental Status: She is alert and oriented to person, place, and time.      Data Reviewed: Sodium 131, potassium 5.0, creatinine 0.78  Family Communication: Updated daughter yesterday  Disposition: Status is: Inpatient Remains inpatient appropriate because: DSS to work on placement  Planned Discharge Destination: Wherever DSS is able to work on placement    Time spent: 28 minutes  Author: Loletha Grayer, MD 09/29/2021 3:32 PM  For on call review [email or web address].
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
## 2021-09-29 NOTE — Inpatient Diabetes Management (Signed)
Inpatient Diabetes Program Recommendations  AACE/ADA: New Consensus Statement on Inpatient Glycemic Control   Target Ranges:  Prepandial:   less than 140 mg/dL      Peak postprandial:   less than 180 mg/dL (1-2 hours)      Critically ill patients:  140 - 180 mg/dL    Latest Reference Range & Units 09/29/21 05:47  Glucose 70 - 99 mg/dL 444 (H)    Latest Reference Range & Units 09/28/21 02:10 09/28/21 07:38 09/28/21 11:57 09/28/21 15:55 09/28/21 20:08  Glucose-Capillary 70 - 99 mg/dL 397 (H) 382 (H) 341 (H) 101 (H) 402 (H)    Review of Glycemic Control  Diabetes history: DM1 Outpatient Diabetes medications: Lantus 8 units daily, Humalog 0-9 units TID with meals and at HS Current orders for Inpatient glycemic control: Semglee 8 units QHS, Novolog 0-15 units TID with meals, Novolog 0-5 units QHS, Novolog 3 units TID with meals   Inpatient Diabetes Program Recommendations:     Insulin: Please consider increasing Semglee to 10 units QHS and meal coverage to Novolog 4 units TID with meals if patient eats at least 50% of meals. If patient is eating during the night, she will need insulin to cover carbohydrates she is consumed; may want to order Novolog 0-5 units PRN for snacks during the night (1 unit per 15 grams of carbs).  Thanks, Barnie Alderman, RN, MSN, [place] Diabetes Coordinator Inpatient Diabetes Program [phone] (Team Pager from 8am to [place])

## 2021-09-30 DIAGNOSIS — E875 Hyperkalemia: Secondary | ICD-10-CM | POA: Diagnosis not present

## 2021-09-30 DIAGNOSIS — Z8673 Personal history of transient ischemic attack (TIA), and cerebral infarction without residual deficits: Secondary | ICD-10-CM | POA: Diagnosis not present

## 2021-09-30 DIAGNOSIS — E871 Hypo-osmolality and hyponatremia: Secondary | ICD-10-CM | POA: Diagnosis not present

## 2021-09-30 DIAGNOSIS — E11649 Type 2 diabetes mellitus with hypoglycemia without coma: Secondary | ICD-10-CM | POA: Diagnosis not present

## 2021-09-30 LAB — BASIC METABOLIC PANEL
Anion gap: 5 (ref 5–15)
BUN: 24 mg/dL — ABNORMAL HIGH (ref 8–23)
CO2: 25 mmol/L (ref 22–32)
Calcium: 8.7 mg/dL — ABNORMAL LOW (ref 8.9–10.3)
Chloride: 104 mmol/L (ref 98–111)
Creatinine, Ser: 0.73 mg/dL (ref 0.44–1.00)
GFR, Estimated: >60 mL/min (ref >60)
Glucose, Bld: 317 mg/dL — ABNORMAL HIGH (ref 70–99)
Potassium: 5.2 mmol/L — ABNORMAL HIGH (ref 3.5–5.1)
Sodium: 134 mmol/L — ABNORMAL LOW (ref 135–145)

## 2021-09-30 LAB — LACTATE DEHYDROGENASE: LDH: 197 U/L — ABNORMAL HIGH (ref 98–192)

## 2021-09-30 LAB — GLUCOSE, CAPILLARY
Glucose-Capillary: 151 mg/dL — ABNORMAL HIGH (ref 70–99)
Glucose-Capillary: 512 mg/dL (ref 70–99)
Glucose-Capillary: 197 mg/dL — ABNORMAL HIGH (ref 70–99)
Glucose-Capillary: 213 mg/dL — ABNORMAL HIGH (ref 70–99)
Glucose-Capillary: 290 mg/dL — ABNORMAL HIGH (ref 70–99)

## 2021-09-30 LAB — HEMOGLOBIN: Hemoglobin: 10.2 g/dL — ABNORMAL LOW (ref 12.0–15.0)

## 2021-09-30 LAB — BILIRUBIN, TOTAL: Total Bilirubin: 0.2 mg/dL — ABNORMAL LOW (ref 0.3–1.2)

## 2021-09-30 MED ORDER — INSULIN ASPART 100 UNIT/ML IJ SOLN
4.0000 [IU] | Freq: Three times a day (TID) | INTRAMUSCULAR | Status: DC
Start: 2021-09-30 — End: 2021-10-02
  Administered 2021-09-30 – 2021-10-02 (×8): 4 [IU] via SUBCUTANEOUS
  Filled 2021-09-30 (×8): qty 1

## 2021-09-30 MED ORDER — SODIUM ZIRCONIUM CYCLOSILICATE 10 G PO PACK
10.0000 g | PACK | Freq: Every day | ORAL | Status: DC
  Administered 2021-09-30: 10 g via ORAL
  Filled 2021-09-30 (×2): qty 1

## 2021-09-30 MED ORDER — INSULIN GLARGINE-YFGN 100 UNIT/ML ~~LOC~~ SOLN
10.0000 [IU] | Freq: Every day | SUBCUTANEOUS | Status: DC
  Administered 2021-09-30: 10 [IU] via SUBCUTANEOUS
  Filled 2021-09-30: qty 0.1

## 2021-09-30 MED ORDER — NEPRO/CARBSTEADY PO LIQD
237.0000 mL | Freq: Three times a day (TID) | ORAL | Status: DC
  Administered 2021-09-30 – 2021-10-04 (×9): 237 mL via ORAL

## 2021-09-30 NOTE — Progress Notes (Signed)
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  Initial vitals in the emergency department showed temperature of 98.6, respiration rate 18, heart rate of 91, blood pressure 129/88, SPO2 of 98% on room air.  Serum sodium is 128, potassium 6.3, chloride of 100, bicarb 20, BUN of 25, serum creatinine of 0.99, GFR greater than 60, nonfasting blood glucose 568, WBC 7.1, hemoglobin 10.8, platelets of 449.  High sensitive troponin was 10.  Patient sleeping better on Seroquel. Treating hyperkalemia during the hospital course  Assessment and Plan: * Hyperkalemia Potassium 5.2 today.  Change diet to renal diet.  Spoke to the patient about low potato diet and other foods that have high potassium. Continue Lokelma daily.  With total bilirubin low and LDH only a few points higher than the normal range this is not hemolysis.  Hyponatremia Sodium 134.  Likely low secondary to pseudo hyponatremia with high sugars.  Uncontrolled type 2 diabetes mellitus with hypoglycemia, with long-term current use of insulin (HCC) Very labile sugars, increase Semglee insulin to 10 units plus sliding scale and standing dose short acting insulin prior to meals.  Hemoglobin A1c very elevated at 15.  Continue Lipitor.  Type 2 diabetes mellitus with hyperlipidemia (HCC) Increase Semglee insulin 10 units at bedtime with 4 units prior to meals plus sliding scale.  Continue Lipitor.   History of stroke Continue atorvastatin 40 mg, aspirin 81 mg resumed  Anxiety and depression Mirtazapine 15 mg  nightly  Restless leg syndrome Ropinirole 0.5 mg nightly resumed  HLD (hyperlipidemia) Continue atorvastatin 40 mg nightly  Protein-calorie malnutrition, severe Mirtazapine 15 mg nightly resumed Dietary has been consulted  Status post colostomy ([place]) Colostomy bag in place Appreciate wound/ostomy care   Tachycardia Continue low-dose Cardizem CD.  Insomnia Trial of Seroquel at night  Decubital ulcer Coccyx stage II, present on admission.  See full description below  Pancreatic mass Seen on prior imaging of the abdomen.  We will send off a CA 19-9.  Keep appointment with Dr. Zenia Resides at [facility] on 11/24/2021  Tobacco dependence Nicotine patch as needed ordered   PPD negative as of this morning.  Will look at it again tomorrow morning.     Subjective: Patient feels okay.  Always complains of being hungry.  No pain anywhere.  Physical Exam: Vitals:   09/29/21 2003 09/30/21 0210 09/30/21 0715 09/30/21 0904  BP: (!) 160/97 100/73 106/84 (!) 134/90  Pulse: 95 93 (!) 102 86  Resp: '17 16 19   '$ Temp: 98.1 F (36.7 C) 98.1 F (36.7 C) 97.9 F (36.6 C)   TempSrc:      SpO2: 96% 99% 100%   Weight:      Height:       Physical Exam HENT:     Head: Normocephalic.     Mouth/Throat:     Pharynx: No oropharyngeal exudate.  Eyes:     General: Lids are normal.     Conjunctiva/sclera: Conjunctivae normal.  Cardiovascular:     Rate and Rhythm: Normal rate and regular rhythm.     Heart sounds: Normal heart sounds, S1 normal and S2 normal.  Pulmonary:  Breath sounds: No decreased breath sounds, wheezing, rhonchi or rales.  Abdominal:     Palpations: Abdomen is soft.     Tenderness: There is no abdominal tenderness.  Musculoskeletal:     Right lower leg: No swelling.     Left lower leg: No swelling.  Skin:    General: Skin is warm.     Findings: No rash.  Neurological:     Mental Status: She is alert.     Comments: Answers questions appropriately.     Data  Reviewed: Potassium again 5.2.  Total bilirubin 0.2 and LDH slightly elevated at 197.  Hemoglobin 10.2.  Family Communication: Left message for patient's daughter  Disposition: Status is: Inpatient Remains inpatient appropriate because: DSS trying to locate a place for the patient to be.  Planned Discharge Destination: DSS working on placement    Time spent: 28 minutes  Author: Loletha Grayer, MD 09/30/2021 2:27 PM  For on call review [email or web address].

## 2021-10-01 DIAGNOSIS — E11649 Type 2 diabetes mellitus with hypoglycemia without coma: Secondary | ICD-10-CM | POA: Diagnosis not present

## 2021-10-01 DIAGNOSIS — E875 Hyperkalemia: Secondary | ICD-10-CM | POA: Diagnosis not present

## 2021-10-01 DIAGNOSIS — E871 Hypo-osmolality and hyponatremia: Secondary | ICD-10-CM | POA: Diagnosis not present

## 2021-10-01 DIAGNOSIS — E1169 Type 2 diabetes mellitus with other specified complication: Secondary | ICD-10-CM | POA: Diagnosis not present

## 2021-10-01 LAB — BASIC METABOLIC PANEL
Anion gap: 6 (ref 5–15)
BUN: 27 mg/dL — ABNORMAL HIGH (ref 8–23)
CO2: 24 mmol/L (ref 22–32)
Calcium: 8.8 mg/dL — ABNORMAL LOW (ref 8.9–10.3)
Chloride: 98 mmol/L (ref 98–111)
Creatinine, Ser: 0.96 mg/dL (ref 0.44–1.00)
GFR, Estimated: >60 mL/min (ref >60)
Glucose, Bld: 595 mg/dL (ref 70–99)
Potassium: 5.8 mmol/L — ABNORMAL HIGH (ref 3.5–5.1)
Sodium: 128 mmol/L — ABNORMAL LOW (ref 135–145)

## 2021-10-01 LAB — GLUCOSE, CAPILLARY
Glucose-Capillary: 524 mg/dL (ref 70–99)
Glucose-Capillary: 272 mg/dL — ABNORMAL HIGH (ref 70–99)
Glucose-Capillary: 170 mg/dL — ABNORMAL HIGH (ref 70–99)
Glucose-Capillary: 117 mg/dL — ABNORMAL HIGH (ref 70–99)

## 2021-10-01 MED ORDER — SODIUM BICARBONATE 8.4 % IV SOLN
50.0000 meq | Freq: Once | INTRAVENOUS | Status: DC
  Filled 2021-10-01: qty 50

## 2021-10-01 MED ORDER — INSULIN GLARGINE-YFGN 100 UNIT/ML ~~LOC~~ SOLN
6.0000 [IU] | Freq: Two times a day (BID) | SUBCUTANEOUS | Status: DC
  Administered 2021-10-01: 6 [IU] via SUBCUTANEOUS
  Filled 2021-10-01 (×2): qty 0.06

## 2021-10-01 MED ORDER — SODIUM ZIRCONIUM CYCLOSILICATE 10 G PO PACK
10.0000 g | PACK | Freq: Two times a day (BID) | ORAL | Status: DC
  Administered 2021-10-01 – 2021-10-02 (×3): 10 g via ORAL
  Filled 2021-10-01 (×3): qty 1

## 2021-10-01 MED ORDER — CALCIUM GLUCONATE-NACL 1-0.675 GM/50ML-% IV SOLN
1.0000 g | Freq: Once | INTRAVENOUS | Status: AC
  Administered 2021-10-01: 1000 mg via INTRAVENOUS
  Filled 2021-10-01: qty 50

## 2021-10-01 MED ORDER — STERILE WATER FOR INJECTION IV SOLN
Freq: Once | INTRAVENOUS | Status: AC
  Filled 2021-10-01: qty 150

## 2021-10-01 MED ORDER — INSULIN GLARGINE-YFGN 100 UNIT/ML ~~LOC~~ SOLN
7.0000 [IU] | Freq: Two times a day (BID) | SUBCUTANEOUS | Status: DC
Start: 2021-10-01 — End: 2021-10-02
  Administered 2021-10-01 – 2021-10-02 (×2): 7 [IU] via SUBCUTANEOUS
  Filled 2021-10-01 (×2): qty 0.07

## 2021-10-01 NOTE — Progress Notes (Signed)
Progress Note   [name] [id number]:[id number] DOB: 18-Apr-1960 DOA: 09/25/2021     4 DOS: the patient was seen and examined on 10/01/2021   Brief hospital course: Brittany [name] is a 61 year old female, with hyperlipidemia, poorly controlled insulin dependent diabetes, history of pancreatic cancer status post pancreectomy and splenectomy, colostomy status, restless leg syndrome, who presents emergency department for chief concerns of nausea and vomiting.  Initial vitals in the emergency department showed temperature of 98.6, respiration rate 18, heart rate of 91, blood pressure 129/88, SPO2 of 98% on room air.  Serum sodium is 128, potassium 6.3, chloride of 100, bicarb 20, BUN of 25, serum creatinine of 0.99, GFR greater than 60, nonfasting blood glucose 568, WBC 7.1, hemoglobin 10.8, platelets of 449.  High sensitive troponin was 10.  Patient sleeping better on Seroquel. Treating hyperkalemia during the hospital course.  Looks like every time her sugar is high she has hyperkalemia.  We will try to get better control of her sugars by changing Semglee insulin to twice a day dosing.  PPD negative read on the morning of 10/01/2021  DSS working on placement  Assessment and Plan: * Hyperkalemia Potassium 5.8 today.  Continue renal diet with no potatoes.  Looking back at all of her labs every time her potassium was high and her sugar was high.  I believe hyperkalemia secondary to hyperglycemia.  We will give Lokelma twice a day.  Calcium and sodium bicarb also given.  Hyponatremia Sodium 128 today.  Likely low secondary to pseudo hyponatremia with high sugars.  Uncontrolled type 2 diabetes mellitus with hypoglycemia, with long-term current use of insulin (HCC) Very labile sugars, increase Semglee insulin to 7 units twice daily.  Continue sliding scale with 4 units plus short acting insulin sliding scale prior to meals.  Type 2 diabetes mellitus with hyperlipidemia (HCC) Increase  Semglee insulin 10 units at bedtime with 4 units prior to meals plus sliding scale.  Continue Lipitor.  History of stroke Continue atorvastatin 40 mg, aspirin 81 mg resumed  Anxiety and depression Mirtazapine 15 mg nightly  Restless leg syndrome Ropinirole 0.5 mg nightly resumed  HLD (hyperlipidemia) Continue atorvastatin 40 mg nightly  Protein-calorie malnutrition, severe Mirtazapine 15 mg nightly resumed Dietary has been consulted  Status post colostomy ([place]) Colostomy bag in place Appreciate wound/ostomy care   Tachycardia Continue low-dose Cardizem CD.  Insomnia Trial of Seroquel at night  Decubital ulcer Coccyx stage II, present on admission.  See full description below  Pancreatic mass Seen on prior imaging of the abdomen.  We will send off a CA 19-9.  Keep appointment with Dr. Zenia [name] at [facility] on 11/24/2021  Tobacco dependence Nicotine patch as needed ordered  PPD negative read by me this morning.      Subjective: Patient feels fine.  Offers complaints of being hungry.  Feels okay.  Initially admitted with nausea and vomiting.  Physical Exam: Vitals:   09/30/21 1524 09/30/21 2016 10/01/21 0420 10/01/21 0722  BP: 110/75 (!) 135/95 (!) 141/97 (!) 134/93  Pulse: 87 91 92 93  Resp: '17 16 15 16  '$ Temp: (!) 97.2 F (36.2 C) 98 F (36.7 C) 97.7 F (36.5 C) 97.7 F (36.5 C)  TempSrc:  Oral    SpO2: 100% 100% 100% 100%  Weight:      Height:       Physical Exam HENT:     Head: Normocephalic.     Mouth/Throat:     Pharynx: No oropharyngeal  exudate.  Eyes:     General: Lids are normal.     Conjunctiva/sclera: Conjunctivae normal.  Cardiovascular:     Rate and Rhythm: Normal rate and regular rhythm.     Heart sounds: Normal heart sounds, S1 normal and S2 normal.  Pulmonary:     Breath sounds: No decreased breath sounds, wheezing, rhonchi or rales.  Abdominal:     Palpations: Abdomen is soft.     Tenderness: There is no abdominal tenderness.   Musculoskeletal:     Right lower leg: No swelling.     Left lower leg: No swelling.  Skin:    General: Skin is warm.     Findings: No rash.  Neurological:     Mental Status: She is alert.     Comments: Answers questions appropriately.     Data Reviewed: Potassium elevated every time sugars elevated.  Potassium 5.8 this morning and glucose was 595.  Sodium 128  Family Communication: spoke with patient's daughter on the phone.  Disposition: Status is: Inpatient Remains inpatient appropriate because: DSS working on placement  Planned Discharge Destination: DSS working on placement  Time spent: 28 minutes  Author: Loletha Grayer, MD 10/01/2021 3:03 PM  For on call review [email or web address].

## 2021-10-02 DIAGNOSIS — E871 Hypo-osmolality and hyponatremia: Secondary | ICD-10-CM | POA: Diagnosis not present

## 2021-10-02 DIAGNOSIS — E11649 Type 2 diabetes mellitus with hypoglycemia without coma: Secondary | ICD-10-CM | POA: Diagnosis not present

## 2021-10-02 DIAGNOSIS — E875 Hyperkalemia: Secondary | ICD-10-CM | POA: Diagnosis not present

## 2021-10-02 DIAGNOSIS — E1169 Type 2 diabetes mellitus with other specified complication: Secondary | ICD-10-CM | POA: Diagnosis not present

## 2021-10-02 LAB — GLUCOSE, CAPILLARY
Glucose-Capillary: 147 mg/dL — ABNORMAL HIGH (ref 70–99)
Glucose-Capillary: 130 mg/dL — ABNORMAL HIGH (ref 70–99)
Glucose-Capillary: 122 mg/dL — ABNORMAL HIGH (ref 70–99)
Glucose-Capillary: 95 mg/dL (ref 70–99)
Glucose-Capillary: 298 mg/dL — ABNORMAL HIGH (ref 70–99)

## 2021-10-02 LAB — BASIC METABOLIC PANEL
Anion gap: 9 (ref 5–15)
BUN: 20 mg/dL (ref 8–23)
CO2: 28 mmol/L (ref 22–32)
Calcium: 8.9 mg/dL (ref 8.9–10.3)
Chloride: 102 mmol/L (ref 98–111)
Creatinine, Ser: 0.81 mg/dL (ref 0.44–1.00)
GFR, Estimated: >60 mL/min (ref >60)
Glucose, Bld: 159 mg/dL — ABNORMAL HIGH (ref 70–99)
Potassium: 4.1 mmol/L (ref 3.5–5.1)
Sodium: 139 mmol/L (ref 135–145)

## 2021-10-02 LAB — CA 19-9 (SERIAL): CA 19-9: <2 U/mL (ref 0–35)

## 2021-10-02 MED ORDER — INSULIN ASPART 100 UNIT/ML IJ SOLN
2.0000 [IU] | Freq: Three times a day (TID) | INTRAMUSCULAR | Status: DC
Start: 2021-10-02 — End: 2021-10-03
  Administered 2021-10-03 (×2): 2 [IU] via SUBCUTANEOUS
  Filled 2021-10-02 (×2): qty 1

## 2021-10-02 MED ORDER — INSULIN DETEMIR 100 UNIT/ML ~~LOC~~ SOLN
6.0000 [IU] | Freq: Two times a day (BID) | SUBCUTANEOUS | Status: DC
Start: 2021-10-02 — End: 2021-10-03
  Administered 2021-10-02 – 2021-10-03 (×2): 6 [IU] via SUBCUTANEOUS
  Filled 2021-10-02 (×3): qty 0.06

## 2021-10-02 NOTE — Progress Notes (Signed)
Progress Note   [name] BSW:[id number] DOB: August 06, 1960 DOA: 09/25/2021     5 DOS: the patient was seen and examined on 10/02/2021   Brief hospital course: 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  Initial vitals in the emergency department showed temperature of 98.6, respiration rate 18, heart rate of 91, blood pressure 129/88, SPO2 of 98% on room air.  Serum sodium is 128, potassium 6.3, chloride of 100, bicarb 20, BUN of 25, serum creatinine of 0.99, GFR greater than 60, nonfasting blood glucose 568, WBC 7.1, hemoglobin 10.8, platelets of 449.  High sensitive troponin was 10.  Patient sleeping better on Seroquel. Treating hyperkalemia during the hospital course.  Looks like every time her sugar is high she has hyperkalemia.  We will try to get better control of her sugars by changing Semglee insulin to twice a day dosing.  PPD negative read on the morning of 10/01/2021  DSS working on placement  Assessment and Plan: * Uncontrolled type 2 diabetes mellitus with hypoglycemia, with long-term current use of insulin (HCC) Last sugar is much better but this may be lower than she is used to.  Case discussed with diabetes coordinator.  We will change insulin over to Levemir and decrease to 6 units twice a day.  We will decrease short acting insulin to 2 units plus sliding scale.  Hyponatremia Sodium 139 with a sugar of 159 on this morning's chemistry.  This is pseudohyponatremia secondary to high sugars.  Hyperkalemia Potassium 4.1 when sugar is 159.  Likely hyperkalemia secondary to elevated sugars.  Type 2 diabetes mellitus with hyperlipidemia (HCC) Changed insulin over to Levemir insulin 6 units twice a day.  Continue  Lipitor  History of stroke Continue atorvastatin 40 mg, aspirin 81 mg resumed  Anxiety and depression Mirtazapine 15 mg nightly  Restless leg syndrome Ropinirole 0.5 mg nightly resumed  HLD (hyperlipidemia) Continue atorvastatin 40 mg nightly  Protein-calorie malnutrition, severe Mirtazapine 15 mg nightly resumed Dietary has been consulted  Status post colostomy ([place]) Colostomy bag in place Appreciate wound/ostomy care   Tachycardia Discontinue Cardizem CD with blood pressure being on the lower side.  Insomnia Trial of Seroquel at night  Decubital ulcer Coccyx stage II, present on admission.  See full description below  Pancreatic mass Seen on prior imaging of the abdomen.  We will send off a CA 19-9.  Keep appointment with Dr. Zenia Resides at [facility] on 11/24/2021  Tobacco dependence Nicotine patch as needed ordered        Subjective: Patient was up all night and was tired this morning when I saw her.  Sugars have trended lower dose and stability to twice a day on the long-acting insulin.  Initially admitted with nausea vomiting.  Physical Exam: Vitals:   10/02/21 0730 10/02/21 0902 10/02/21 1138 10/02/21 1604  BP: 108/83 102/80 99/78 109/76  Pulse: 92 97 99 99  Resp: '16  14 16  '$ Temp: 97.7 F (36.5 C)  97.6 F (36.4 C) 97.8 F (36.6 C)  TempSrc: Axillary  Axillary   SpO2: 97% 95% 95% 96%  Weight:      Height:       Physical Exam HENT:     Head: Normocephalic.     Mouth/Throat:     Pharynx: No oropharyngeal exudate.  Eyes:  General: Lids are normal.     Conjunctiva/sclera: Conjunctivae normal.  Cardiovascular:     Rate and Rhythm: Normal rate and regular rhythm.     Heart sounds: Normal heart sounds, S1 normal and S2 normal.  Pulmonary:     Breath sounds: No decreased breath sounds, wheezing, rhonchi or rales.  Abdominal:     Palpations: Abdomen is soft.     Tenderness: There is no abdominal tenderness.  Musculoskeletal:     Right lower leg: No  swelling.     Left lower leg: No swelling.  Skin:    General: Skin is warm.     Findings: No rash.  Neurological:     Mental Status: She is alert.     Comments: Answers questions appropriately.     Data Reviewed: Last for sugars 147, 130, 122 and 95  Family Communication: Spoke with daughter yesterday  Disposition: Status is: Inpatient Remains inpatient appropriate because: Needs to be placed  Planned Discharge Destination: DSS working on placement.    Time spent: 35 minutes  Author: Loletha Grayer, MD 10/02/2021 4:46 PM  For on call review [email or web address].

## 2021-10-02 NOTE — Progress Notes (Signed)
PT Cancellation Note  Patient Details Name: Brittany [name] MRN: [id number] DOB: 06-26-1960   Cancelled Treatment:    Reason Eval/Treat Not Completed: Fatigue/lethargy limiting ability to participate.  Chart reviewed and attempted to see pt.  Sitter in room said pt just fell asleep after drinking a shake and sugar was at 91.  Sitter stated that she would likely not be up for movement.  Therapist attempted to wake pt up and would open eyes then quickly go back to sleep.  Multiple attempts made but pt too fatigued to participate at this time.  Will re-attempt evaluation tomorrow AM as medically appropriate.   Gwenlyn Saran, PT, DPT 10/02/21, 4:24 PM

## 2021-10-03 DIAGNOSIS — E871 Hypo-osmolality and hyponatremia: Secondary | ICD-10-CM | POA: Diagnosis not present

## 2021-10-03 DIAGNOSIS — E1169 Type 2 diabetes mellitus with other specified complication: Secondary | ICD-10-CM | POA: Diagnosis not present

## 2021-10-03 DIAGNOSIS — E11649 Type 2 diabetes mellitus with hypoglycemia without coma: Secondary | ICD-10-CM | POA: Diagnosis not present

## 2021-10-03 DIAGNOSIS — E875 Hyperkalemia: Secondary | ICD-10-CM | POA: Diagnosis not present

## 2021-10-03 LAB — GLUCOSE, CAPILLARY
Glucose-Capillary: 268 mg/dL — ABNORMAL HIGH (ref 70–99)
Glucose-Capillary: 208 mg/dL — ABNORMAL HIGH (ref 70–99)
Glucose-Capillary: 81 mg/dL (ref 70–99)
Glucose-Capillary: 246 mg/dL — ABNORMAL HIGH (ref 70–99)

## 2021-10-03 MED ORDER — INSULIN ASPART 100 UNIT/ML IJ SOLN
3.0000 [IU] | Freq: Three times a day (TID) | INTRAMUSCULAR | Status: DC
Start: 2021-10-03 — End: 2021-10-16
  Administered 2021-10-03 – 2021-10-15 (×32): 3 [IU] via SUBCUTANEOUS
  Filled 2021-10-03 (×32): qty 1

## 2021-10-03 MED ORDER — INSULIN DETEMIR 100 UNIT/ML ~~LOC~~ SOLN
7.0000 [IU] | Freq: Two times a day (BID) | SUBCUTANEOUS | Status: DC
  Filled 2021-10-03: qty 0.07

## 2021-10-03 MED ORDER — INSULIN DETEMIR 100 UNIT/ML ~~LOC~~ SOLN
6.0000 [IU] | Freq: Two times a day (BID) | SUBCUTANEOUS | Status: DC
  Administered 2021-10-03 – 2021-10-14 (×21): 6 [IU] via SUBCUTANEOUS
  Filled 2021-10-03 (×23): qty 0.06

## 2021-10-03 NOTE — Progress Notes (Addendum)
PT Screen Note  Patient Details Name: Brittany [name] MRN: [id number] DOB: 1961/01/30   Cancelled Treatment:    Reason Eval/Treat Not Completed: PT screened, no needs identified, will sign off Pt screen with no PT needs identified.  She was able to stand w/o assist, ambulate w/o AD with appropriate speed and confidence, was able to maintain balance during standing dynamic tasks (e.g. taking hospital gown up over her head and turning it backward, reaching outside BOS to grab/push PT and other staff w/o LOBs, etc).  Pt seen, by this therapist, consistently walking the hallways multiple times per day over the course of the last month's admission as well as this current admission. Of note, formal PT eval performed last admission (7/26) she was able to easily ambulate 520 ft and though her cognition did not allow [place] to venture as far this date she easily got into and walked up/down the hallway today; she seems, by all accounts, to be at her current baseline regarding mobility, gait, strength, balance, etc.  She needed a lot of guidance as she struggled to follow cues consistently and become more agitated with cues to try and stay on task.  She ultimately does not appear to have any PT related rehab needs.  Will complete PT orders at this time. Kreg Shropshire, DPT 10/03/2021, 8:07 AM

## 2021-10-03 NOTE — Progress Notes (Addendum)
Progress Note   [name] DOB: 05/27/1960 DOA: 09/25/2021     6 DOS: the patient was seen and examined on 10/03/2021   Brief hospital course: 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  Initial vitals in the emergency department showed temperature of 98.6, respiration rate 18, heart rate of 91, blood pressure 129/88, SPO2 of 98% on room air.  Serum sodium is 128, potassium 6.3, chloride of 100, bicarb 20, BUN of 25, serum creatinine of 0.99, GFR greater than 60, nonfasting blood glucose 568, WBC 7.1, hemoglobin 10.8, platelets of 449.  High sensitive troponin was 10.  Patient sleeping better on Seroquel. Treating hyperkalemia during the hospital course.  Looks like every time her sugar is high she has hyperkalemia.  We will try to get better control of her sugars by changing to Levemir insulin to twice a day dosing.  PPD negative read on the morning of 10/01/2021  DSS working on placement  Assessment and Plan: * Uncontrolled type 2 diabetes mellitus with hypoglycemia, with long-term current use of insulin (HCC) With sugars a little higher today we will keep Levemir to 6 units twice a day and increase short acting insulin to 3 units plus sliding scale prior to meals.  Hyponatremia Sodium 139 with a sugar of 159 on chemistry on 10/02/2021.  This is pseudohyponatremia secondary to high sugars.  Hyperkalemia Potassium 4.1 when sugar is 159 on chemistry on 10/02/2021.  Likely hyperkalemia secondary to elevated sugars.  Type 2 diabetes mellitus with hyperlipidemia (HCC) Changed insulin over to Levemir insulin to 6 units twice a day.  Continue Lipitor  History of stroke Continue atorvastatin 40 mg, aspirin 81 mg  resumed  Anxiety and depression Mirtazapine 15 mg nightly  Restless leg syndrome Ropinirole 0.5 mg nightly resumed  HLD (hyperlipidemia) Continue atorvastatin 40 mg nightly  Protein-calorie malnutrition, severe Mirtazapine 15 mg nightly resumed Dietary has been consulted  Status post colostomy ([place]) Colostomy bag in place Appreciate wound/ostomy care   Tachycardia Discontinue Cardizem CD with blood pressure being on the lower side.  Insomnia Trial of Seroquel at night  Decubital ulcer Coccyx stage II, present on admission.  See full description below  Pancreatic mass Seen on prior imaging of the abdomen.  We will send off a CA 19-9.  Keep appointment with Dr. Zenia Resides at [facility] on 11/24/2021  Tobacco dependence Nicotine patch as needed ordered        Subjective: Patient seen this morning and feels okay.  States she is happy.  States she slept last night.  Eating well.  Initially came in with nausea vomiting.  Physical Exam: Vitals:   10/02/21 1955 10/03/21 0357 10/03/21 0825 10/03/21 1629  BP: 128/87 113/83 107/79 106/75  Pulse: 98 96 (!) 104 90  Resp: '17 18 18 17  '$ Temp: 98 F (36.7 C) 98.2 F (36.8 C) 98.6 F (37 C) 98.2 F (36.8 C)  TempSrc: Axillary Axillary Oral Oral  SpO2: 92% 94% 100% 95%  Weight:      Height:       Physical Exam HENT:     Head: Normocephalic.     Mouth/Throat:     Pharynx: No oropharyngeal exudate.  Eyes:     General: Lids are normal.     Conjunctiva/sclera: Conjunctivae normal.  Cardiovascular:  Rate and Rhythm: Normal rate and regular rhythm.     Heart sounds: Normal heart sounds, S1 normal and S2 normal.  Pulmonary:     Breath sounds: No decreased breath sounds, wheezing, rhonchi or rales.  Abdominal:     Palpations: Abdomen is soft.     Tenderness: There is no abdominal tenderness.     Comments: Ostomy intact.  Musculoskeletal:     Right lower leg: No swelling.     Left lower leg: No swelling.  Skin:    General:  Skin is warm.     Findings: No rash.  Neurological:     Mental Status: She is alert.     Comments: Answers questions appropriately.     Data Reviewed: Chemistry on 10/02/2021 showed normal potassium and sodium when her glucose was 159 on the chemistry.  Family Communication: Spoke with daughter on the phone  Disposition: Status is: Inpatient Remains inpatient appropriate because: Trying to find placement  Planned Discharge Destination: Trying to find placement    Time spent: 28 minutes  Author: Loletha Grayer, MD 10/03/2021 4:41 PM  For on call review [email or web address].

## 2021-10-03 NOTE — TOC Progression Note (Signed)
Transition of Care [facility]) - Progression Note    Patient Details  Name: Brittany [name] MRN: [id number] Date of Birth: [birth year]  [facility] CM/SW Contact  Pete Pelt, RN Phone Number: 10/03/2021, 4:01 PM  Clinical Narrative:   [place] contacted RNCM, they did not approve for SNF at this time    Expected Discharge Plan: [place] Barriers to Discharge: Barriers Resolved  Expected Discharge Plan and Services Expected Discharge Plan: [place]   Discharge Planning Services: CM Consult Post Acute Care Choice: [place] arrangements for the past 2 months: Single Family Home Expected Discharge Date: 09/26/21                         HH Arranged: RN, PT, OT, Nurse's Aide [place] Agency: [place] ([place]), [place] Date [facility] Agency Contacted: 09/26/21   Representative spoke with at [place]: cheryl   Social Determinants of Health (SDOH) Interventions    Readmission Risk Interventions    09/22/2021    1:24 PM 03/06/2021    9:13 AM 02/16/2021    2:45 PM  Readmission Risk Prevention Plan  Transportation Screening Complete Complete Complete  Medication Review (RN Care Manager) Complete Complete Complete  PCP or Specialist appointment within 3-5 days of discharge Complete Complete   HRI or [place] Complete    SW Recovery Care/Counseling Consult Complete  Complete  Palliative Care Screening Not Applicable Not Applicable Not [place] Complete Not Applicable Not Applicable

## 2021-10-04 DIAGNOSIS — E875 Hyperkalemia: Secondary | ICD-10-CM | POA: Diagnosis not present

## 2021-10-04 DIAGNOSIS — K8689 Other specified diseases of pancreas: Secondary | ICD-10-CM | POA: Diagnosis not present

## 2021-10-04 DIAGNOSIS — Z794 Long term (current) use of insulin: Secondary | ICD-10-CM | POA: Diagnosis not present

## 2021-10-04 DIAGNOSIS — E11649 Type 2 diabetes mellitus with hypoglycemia without coma: Secondary | ICD-10-CM | POA: Diagnosis not present

## 2021-10-04 LAB — GLUCOSE, CAPILLARY
Glucose-Capillary: 561 mg/dL (ref 70–99)
Glucose-Capillary: 472 mg/dL — ABNORMAL HIGH (ref 70–99)
Glucose-Capillary: 160 mg/dL — ABNORMAL HIGH (ref 70–99)
Glucose-Capillary: 139 mg/dL — ABNORMAL HIGH (ref 70–99)
Glucose-Capillary: 282 mg/dL — ABNORMAL HIGH (ref 70–99)

## 2021-10-04 LAB — BASIC METABOLIC PANEL
Anion gap: 8 (ref 5–15)
BUN: 25 mg/dL — ABNORMAL HIGH (ref 8–23)
CO2: 24 mmol/L (ref 22–32)
Calcium: 8.8 mg/dL — ABNORMAL LOW (ref 8.9–10.3)
Chloride: 100 mmol/L (ref 98–111)
Creatinine, Ser: 1.02 mg/dL — ABNORMAL HIGH (ref 0.44–1.00)
GFR, Estimated: >60 mL/min (ref >60)
Glucose, Bld: 584 mg/dL (ref 70–99)
Potassium: 4.5 mmol/L (ref 3.5–5.1)
Sodium: 132 mmol/L — ABNORMAL LOW (ref 135–145)

## 2021-10-04 MED ORDER — ZINC SULFATE 220 (50 ZN) MG PO CAPS
220.0000 mg | ORAL_CAPSULE | Freq: Every day | ORAL | Status: AC
  Administered 2021-10-04 – 2021-10-17 (×12): 220 mg via ORAL
  Filled 2021-10-04 (×13): qty 1

## 2021-10-04 MED ORDER — VITAMIN C 500 MG PO TABS
500.0000 mg | ORAL_TABLET | Freq: Two times a day (BID) | ORAL | Status: DC
  Administered 2021-10-04 – 2021-10-19 (×29): 500 mg via ORAL
  Filled 2021-10-04 (×30): qty 1

## 2021-10-04 MED ORDER — ADULT MULTIVITAMIN W/MINERALS CH
1.0000 | ORAL_TABLET | Freq: Every day | ORAL | Status: DC
  Administered 2021-10-04 – 2021-10-19 (×14): 1 via ORAL
  Filled 2021-10-04 (×15): qty 1

## 2021-10-04 MED ORDER — ACETAMINOPHEN 325 MG PO TABS
650.0000 mg | ORAL_TABLET | Freq: Four times a day (QID) | ORAL | Status: DC | PRN
Start: 2021-10-04 — End: 2021-10-19
  Administered 2021-10-04 – 2021-10-15 (×7): 650 mg via ORAL
  Filled 2021-10-04 (×8): qty 2

## 2021-10-04 NOTE — Progress Notes (Addendum)
Nutrition Follow-up  DOCUMENTATION CODES:   Severe malnutrition in context of chronic illness  INTERVENTION:   -D/c Nepro Shake po TID, each supplement provides 425 kcal and 19 grams protein  -Continue MVI with minerals daily -Continue double protein portions with meals -220 mg zinc sulfate daily x 14 days -500 mg vitamin C BID  NUTRITION DIAGNOSIS:   Severe Malnutrition related to chronic illness (pancreatic cancer) as evidenced by moderate fat depletion, severe fat depletion, moderate muscle depletion, severe muscle depletion.  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  8/19- supplement changed from Boost Breeze to Nepro by MD  Reviewed I/O's: +260 ml x 24 hours and +2.6 L since admission  Colostomy output: 200 ml x 24 hours   Pt standing by doorway at time of visit. Safety sitter present.   Intake has improved. Noted meal completions 100%. Pt is consuming Nepro supplement, but dislikes milky supplements.   TOC following for safest discharge disposition. Insurance refusing SNF placement at this time.   Medications reviewed and include remeron.   Labs reviewed: CBGS: [id number] (inpatient orders for glycemic control are 0-15 units insulin aspart daily at bedtime, 0-15 units insulin aspart TID with meals, 3 units insulin aspart TID with meals, and 6 units insulin detemir BID).    Diet Order:   Diet Order             Diet renal/carb modified with fluid restriction Diet-HS Snack? Nothing; Room service appropriate? Yes; Fluid consistency:  Thin  Diet effective now           Diet Carb Modified                   EDUCATION NEEDS:   No education needs have been identified at this time  Skin:  Skin Assessment: Skin Integrity Issues: Skin Integrity Issues:: Stage II Stage II: coccyx Stage III: -  Last BM:  8/23/253 (200 ml via colostomy)  Height:   Ht Readings from Last 1 Encounters:  09/25/21 5' (1.524 m)    Weight:   Wt Readings from Last 1 Encounters:  09/25/21 47 kg    Ideal Body Weight:  45.5 kg  BMI:  Body mass index is 20.24 kg/m.  Estimated Nutritional Needs:   Kcal:  1700-1900  Protein:  90-105 grams  Fluid:  > 1.7 L    Loistine Chance, RD, LDN, [employer] Registered Dietitian II Certified Diabetes Care and Education Specialist Please refer to [facility] for RD and/or RD on-call/weekend/after hours pager

## 2021-10-04 NOTE — Progress Notes (Signed)
       CROSS COVER NOTE  NAME: Brittany [name] MRN: [id number] DOB : June 01, 1960    Date of Service   10/04/2021   HPI/Events of Note   Notified of lost IV access by nursing. IV found out in bed. Unclear if this was accidental or M(r)s Whitmoyer pulled IV out. M(r)s Lieb is a brittle diabetic and has had prior hypoglycemic events.  Interventions   Plan: Replace if patient amenable, ok to leave out if attempt to reinsert agitates patient      This document was prepared using Dragon voice recognition software and may include unintentional dictation errors.  Neomia Glass DNP, MHA, FNP-BC Nurse Practitioner [facility] Pager [phone]

## 2021-10-04 NOTE — Plan of Care (Signed)

## 2021-10-04 NOTE — Progress Notes (Signed)
Sitter at the bedside.

## 2021-10-04 NOTE — Progress Notes (Signed)
  Progress Note   [name] GPQ:[id number] DOB: 1960-07-17 DOA: 09/25/2021     7 DOS: the patient was seen and examined on 10/04/2021   Brief hospital course: 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 Serum sodium is 128, potassium 6.3, chloride of 100, bicarb 20, BUN of 25, serum creatinine of 0.99, GFR greater than 60, nonfasting blood glucose 568, WBC 7.1, hemoglobin 10.8, platelets of 449. Patient had multiple admissions over the last 2 months, glucose was labile with severe hyperglycemia alternating with hypoglycemia.  She also had intermittent hyperkalemia appeared to be correlated with severe hyperglycemia. Insulin dose was adjusted after admission, glucose still running high. Patient is homeless, adult protection agency involved, currently looking for placement  Assessment and Plan: Uncontrolled type 2 diabetes with hyperglycemia alternating with hypoglycemia. Hyponatremia, mainly pseudohyponatremia. Hyperkalemia. Patient glucose now and still running high with occasional hypoglycemia.  Long-acting insulin changed to Levemir, also on sliding scale insulin coverage.  Also covered for meals. Continue monitor glucose.  History of stroke. Continue atorvastatin and aspirin.  Anxiety and depression. Restless leg syndrome. Continue mirtazapine and Requip.  Severe protein calorie malnutrition. On mirtazapine.  Status post colostomy. Continue to follow.  Pancreatic mass. Follow-up with oncology in [facility].  Coccyx stage II decubitus ulcer. Continue to follow.  Discharge planning. Patient is homeless, Education officer, museum is working on placement.     Subjective:  Patient feels well, no nausea vomiting abdominal  pain. No short of breath or cough.  Physical Exam: Vitals:   10/03/21 1629 10/03/21 1900 10/04/21 0429 10/04/21 0919  BP: 106/75 110/84 99/74 112/77  Pulse: 90 99 98 99  Resp: '17 17 18 16  '$ Temp: 98.2 F (36.8 C) 97.6 F (36.4 C) 98 F (36.7 C) 97.8 F (36.6 C)  TempSrc: Oral Oral  Oral  SpO2: 95% 96% 97% 100%  Weight:      Height:      General exam: Appears calm and comfortable, appears severely malnourished. Respiratory system: Clear to auscultation. Respiratory effort normal. Cardiovascular system: S1 & S2 heard, RRR. No JVD, murmurs, rubs, gallops or clicks. No pedal edema. Gastrointestinal system: Abdomen is nondistended, soft and nontender. No organomegaly or masses felt. Normal bowel sounds heard. Central nervous system: Alert and oriented x2. No focal neurological deficits. Extremities: Symmetric 5 x 5 power. Skin: No rashes, lesions or ulcers Psychiatry: Judgement and insight appear normal. Mood & affect appropriate.   Data Reviewed:  Lab results reviewed.  Family Communication:   Disposition: Status is: Inpatient Remains inpatient appropriate because: Unsafe discharge.  Planned Discharge Destination:  Pending    Time spent: 35 minutes  Author: Sharen Hones, MD 10/04/2021 12:53 PM  For on call review [email or web address].

## 2021-10-04 NOTE — Inpatient Diabetes Management (Addendum)
Inpatient Diabetes Program Recommendations  AACE/ADA: New Consensus Statement on Inpatient Glycemic Control (2015)  Target Ranges:  Prepandial:   less than 140 mg/dL      Peak postprandial:   less than 180 mg/dL (1-2 hours)      Critically ill patients:  140 - 180 mg/dL    Latest Reference Range & Units 10/03/21 08:12 10/03/21 11:53 10/03/21 16:25 10/03/21 19:27  Glucose-Capillary 70 - 99 mg/dL 268 (H)  10 units Novolog  6 units Levemir  208 (H)  7 units Novolog  81  3 units Novolog '@1748'$   246 (H)  2 units Novolog '@2241'$   6 units Levemir '@2240'$     Latest Reference Range & Units 10/04/21 07:36  Glucose-Capillary 70 - 99 mg/dL 561 (HH)  (HH): Data is critically high     History: Type 1 Diabetes  Home DM Meds:  Lantus 8 units daily      Humalog 0-9 units TID with meals and at HS  Current Orders: Levemir 6 units BID      Novolog Moderate Correction Scale/ SSI (0-15 units) TID AC + HS             Novolog 3 units TID with meals     Note CBG 561 this AM--Question if pt eating prior to CBG being taken this AM??  She is currently eating 100% of all meals and getting PO supplements TID with meals  I would be cautious and consider adding snack coverage during the night hours before increasing the Basal insulin as pt only weighs 47 kg  If patient is eating during the night (snacking), she will need insulin to cover the carbohydrates she is consuming  May consider ordering Novolog 0-5 units PRN for snacks during the night  1 unit per 15 grams of carbohydrates    --Will follow patient during hospitalization--  Wyn Quaker RN, MSN, [place] Diabetes Coordinator Inpatient Glycemic Control Team Team Pager: [phone] (8a-5p)

## 2021-10-05 DIAGNOSIS — K8689 Other specified diseases of pancreas: Secondary | ICD-10-CM | POA: Diagnosis not present

## 2021-10-05 DIAGNOSIS — E11649 Type 2 diabetes mellitus with hypoglycemia without coma: Secondary | ICD-10-CM | POA: Diagnosis not present

## 2021-10-05 DIAGNOSIS — Z794 Long term (current) use of insulin: Secondary | ICD-10-CM | POA: Diagnosis not present

## 2021-10-05 DIAGNOSIS — E871 Hypo-osmolality and hyponatremia: Secondary | ICD-10-CM | POA: Diagnosis not present

## 2021-10-05 LAB — GLUCOSE, CAPILLARY
Glucose-Capillary: 325 mg/dL — ABNORMAL HIGH (ref 70–99)
Glucose-Capillary: 116 mg/dL — ABNORMAL HIGH (ref 70–99)
Glucose-Capillary: 128 mg/dL — ABNORMAL HIGH (ref 70–99)
Glucose-Capillary: 161 mg/dL — ABNORMAL HIGH (ref 70–99)

## 2021-10-05 MED ORDER — QUETIAPINE FUMARATE 25 MG PO TABS
50.0000 mg | ORAL_TABLET | Freq: Every day | ORAL | Status: DC
  Administered 2021-10-05 – 2021-10-11 (×7): 50 mg via ORAL
  Filled 2021-10-05 (×7): qty 2

## 2021-10-05 MED ORDER — MELATONIN 5 MG PO TABS
2.5000 mg | ORAL_TABLET | Freq: Every day | ORAL | Status: DC
  Administered 2021-10-05 – 2021-10-18 (×14): 2.5 mg via ORAL
  Filled 2021-10-05 (×14): qty 1

## 2021-10-05 NOTE — TOC Progression Note (Addendum)
Transition of Care [facility]) - Progression Note    Patient Details  Name: Brittany [name] MRN: [id number] Date of Birth: [birth year]  [facility]) CM/SW Contact  Royston Bake, RN,MHA,CCM Transition of Care Supervisor Phone Number: [phone] 10/05/2021, 11:50 AM  Clinical Narrative:    Talked to [place] with DSS. She is awaiting on [employer] to review clinical information; [place] will call TOC with an update for disposition needs. Per DSS patient is under a protective order and cannot return home; patient unable to care for herself ( 09/21/21 - found in home covered in feces). Family members unable to care for the patient at home. TOC will continue to follow for progression of care.   Expected Discharge Plan: Long Term Acute Care (LTAC) Barriers to Discharge: Barriers Resolved  Expected Discharge Plan and Services Expected Discharge Plan: Long Term Acute Care (LTAC)   Discharge Planning Services: CM Consult Post Acute Care Choice: [place] arrangements for the past 2 months: Single Family Home Expected Discharge Date: 09/26/21                         HH Arranged: RN, PT, OT, Nurse's Aide [place] Agency: [place] ([place]), [place] Date [place]: 09/26/21   Representative spoke with at [name]: cheryl   Social Determinants of Health (SDOH) Interventions    Readmission Risk Interventions    09/22/2021    1:24 PM 03/06/2021    9:13 AM 02/16/2021    2:45 PM  Readmission Risk Prevention Plan  Transportation Screening Complete Complete Complete  Medication Review Press photographer) Complete Complete Complete  PCP or Specialist appointment within 3-5 days of discharge Complete Complete   HRI or [place] Complete    SW Recovery Care/Counseling Consult Complete  Complete  Palliative Care Screening Not Applicable Not Applicable Not [place] Complete Not Applicable  Not Applicable

## 2021-10-05 NOTE — Progress Notes (Signed)
  Progress Note   [name] GYI:[id number] DOB: 1960/12/24 DOA: 09/25/2021     8 DOS: the patient was seen and examined on 10/05/2021   Brief hospital course: Ms. 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 Serum sodium is 128, potassium 6.3, chloride of 100, bicarb 20, BUN of 25, serum creatinine of 0.99, GFR greater than 60, nonfasting blood glucose 568, WBC 7.1, hemoglobin 10.8, platelets of 449. Patient had multiple admissions over the last 2 months, glucose was labile with severe hyperglycemia alternating with hypoglycemia.  She also had intermittent hyperkalemia appeared to be correlated with severe hyperglycemia. Insulin dose was adjusted after admission, glucose still running high. Patient is homeless, adult protection agency involved, currently looking for placement.  Assessment and Plan: Uncontrolled type 2 diabetes with hyperglycemia alternating with hypoglycemia. Hyponatremia, mainly pseudohyponatremia. Hyperkalemia. Glucose finally getting better without significant hypoglycemia.  We will continue current regimen for now.   History of stroke. Continue current regimen.  Insomnia. Patient could not sleep at nighttime, increase Seroquel to 50 mg every evening, also added melatonin.  Discontinued trazodone.   Anxiety and depression. Restless leg syndrome. Continue mirtazapine and Requip.   Severe protein calorie malnutrition. On mirtazapine.   Status post colostomy. Continue to follow.   Pancreatic mass. Follow-up with oncology in [facility].   Coccyx stage II decubitus ulcer. Continue to follow.      Subjective:  Patient could not sleep last night, requesting for his sleeping medicine. No significant  confusion today, denies any short of breath.  Physical Exam: Vitals:   10/04/21 1605 10/04/21 1949 10/05/21 0404 10/05/21 0745  BP: 112/84 (!) 133/93 106/73 111/84  Pulse: 97 86 (!) 103 98  Resp: '18 18 18 18  '$ Temp: (!) 97 F (36.1 C) 98.4 F (36.9 C) 98.6 F (37 C) 98.7 F (37.1 C)  TempSrc:    Oral  SpO2: 100% 100% 98% 99%  Weight:      Height:       General exam: Appears calm and comfortable  Respiratory system: Clear to auscultation. Respiratory effort normal. Cardiovascular system: S1 & S2 heard, RRR. No JVD, murmurs, rubs, gallops or clicks. No pedal edema. Gastrointestinal system: Abdomen is nondistended, soft and nontender. No organomegaly or masses felt. Normal bowel sounds heard. Central nervous system: Alert and oriented. No focal neurological deficits. Extremities: Symmetric 5 x 5 power. Skin: No rashes, lesions or ulcers Psychiatry: Judgement and insight appear normal. Mood & affect appropriate.   Data Reviewed:  Reviewed lab results.  Family Communication: None  Disposition: Status is: Inpatient Remains inpatient appropriate because: Unsafe discharge.  Planned Discharge Destination:  Pending    Time spent: 29 minutes  Author: Sharen Hones, MD 10/05/2021 12:27 PM  For on call review [email or web address].

## 2021-10-06 DIAGNOSIS — I10 Essential (primary) hypertension: Secondary | ICD-10-CM

## 2021-10-06 DIAGNOSIS — G4700 Insomnia, unspecified: Secondary | ICD-10-CM | POA: Diagnosis not present

## 2021-10-06 DIAGNOSIS — E11649 Type 2 diabetes mellitus with hypoglycemia without coma: Secondary | ICD-10-CM | POA: Diagnosis not present

## 2021-10-06 DIAGNOSIS — Z794 Long term (current) use of insulin: Secondary | ICD-10-CM | POA: Diagnosis not present

## 2021-10-06 LAB — GLUCOSE, CAPILLARY
Glucose-Capillary: 222 mg/dL — ABNORMAL HIGH (ref 70–99)
Glucose-Capillary: 138 mg/dL — ABNORMAL HIGH (ref 70–99)
Glucose-Capillary: 119 mg/dL — ABNORMAL HIGH (ref 70–99)
Glucose-Capillary: 287 mg/dL — ABNORMAL HIGH (ref 70–99)

## 2021-10-06 NOTE — Progress Notes (Signed)
  Progress Note   [name] HCW:[id number] DOB: 02-10-1961 DOA: 09/25/2021     9 DOS: the patient was seen and examined on 10/06/2021   Brief hospital course: 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 Serum sodium is 128, potassium 6.3, chloride of 100, bicarb 20, BUN of 25, serum creatinine of 0.99, GFR greater than 60, nonfasting blood glucose 568, WBC 7.1, hemoglobin 10.8, platelets of 449. Patient had multiple admissions over the last 2 months, glucose was labile with severe hyperglycemia alternating with hypoglycemia.  She also had intermittent hyperkalemia appeared to be correlated with severe hyperglycemia. Insulin dose was adjusted after admission, glucose still running high. Patient is homeless, adult protection agency involved, currently looking for placement.  Assessment and Plan: Uncontrolled type 2 diabetes with hyperglycemia alternating with hypoglycemia. Hyponatremia, mainly pseudohyponatremia. Hyperkalemia. Glucose more stabilized, continue current regimen.   History of stroke. No change in patient regimen.   Insomnia. Slept better with the melatonin and Seroquel.   Anxiety and depression. Restless leg syndrome. Continue mirtazapine and Requip.   Severe protein calorie malnutrition. On mirtazapine.   Status post colostomy. Continue to follow.   Pancreatic mass. Follow-up with oncology in [facility].   Coccyx stage II decubitus ulcer. Continue to follow.      Subjective:  No new issues.  Physical Exam: Vitals:   10/05/21 1556 10/05/21 1937 10/06/21 0409 10/06/21 0740  BP: (!) 126/90 (!) 140/96 119/81 (!) 118/90  Pulse: 82 (!) 108 89 79  Resp: '18 19 17 18  '$ Temp: 98.6 F (37 C) 98 F  (36.7 C) 98.3 F (36.8 C) 97.7 F (36.5 C)  TempSrc:  Oral Oral Oral  SpO2: 99% 94% 98% 94%  Weight:      Height:       General exam: Appears calm and comfortable  Respiratory system: Clear to auscultation. Respiratory effort normal. Cardiovascular system: S1 & S2 heard, RRR. No JVD, murmurs, rubs, gallops or clicks. No pedal edema. Gastrointestinal system: Abdomen is nondistended, soft and nontender. No organomegaly or masses felt. Normal bowel sounds heard. Central nervous system: Alert and oriented. No focal neurological deficits. Extremities: Symmetric 5 x 5 power. Skin: No rashes, lesions or ulcers Psychiatry: Judgement and insight appear normal. Mood & affect appropriate.   Data Reviewed:  There are no new results to review at this time.  Family Communication:   Disposition: Status is: Inpatient Remains inpatient appropriate because: Unsafe for discharge.  Planned Discharge Destination:  Pending decisions.    Time spent: 25 minutes  Author: Sharen Hones, MD 10/06/2021 10:28 AM  For on call review [email or web address].

## 2021-10-06 NOTE — Plan of Care (Signed)

## 2021-10-06 NOTE — Progress Notes (Signed)
Received call from [place] with DSS, no bed available at this time, She will continue to search for placement. Mercie Eon Transition of Care Supervisor [phone]

## 2021-10-07 DIAGNOSIS — Z794 Long term (current) use of insulin: Secondary | ICD-10-CM | POA: Diagnosis not present

## 2021-10-07 DIAGNOSIS — G4700 Insomnia, unspecified: Secondary | ICD-10-CM | POA: Diagnosis not present

## 2021-10-07 DIAGNOSIS — E11649 Type 2 diabetes mellitus with hypoglycemia without coma: Secondary | ICD-10-CM | POA: Diagnosis not present

## 2021-10-07 LAB — GLUCOSE, CAPILLARY
Glucose-Capillary: 494 mg/dL — ABNORMAL HIGH (ref 70–99)
Glucose-Capillary: 489 mg/dL — ABNORMAL HIGH (ref 70–99)
Glucose-Capillary: 86 mg/dL (ref 70–99)
Glucose-Capillary: 307 mg/dL — ABNORMAL HIGH (ref 70–99)
Glucose-Capillary: 336 mg/dL — ABNORMAL HIGH (ref 70–99)
Glucose-Capillary: 451 mg/dL — ABNORMAL HIGH (ref 70–99)
Glucose-Capillary: 294 mg/dL — ABNORMAL HIGH (ref 70–99)

## 2021-10-07 MED ORDER — INSULIN DETEMIR 100 UNIT/ML ~~LOC~~ SOLN
4.0000 [IU] | Freq: Once | SUBCUTANEOUS | Status: AC
  Administered 2021-10-07: 4 [IU] via SUBCUTANEOUS
  Filled 2021-10-07: qty 0.04

## 2021-10-07 NOTE — Progress Notes (Signed)
  Progress Note   [name] DOB: 1960/12/20 DOA: 09/25/2021     10 DOS: the patient was seen and examined on 10/07/2021   Brief hospital course: 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 Serum sodium is 128, potassium 6.3, chloride of 100, bicarb 20, BUN of 25, serum creatinine of 0.99, GFR greater than 60, nonfasting blood glucose 568, WBC 7.1, hemoglobin 10.8, platelets of 449. Patient had multiple admissions over the last 2 months, glucose was labile with severe hyperglycemia alternating with hypoglycemia.  She also had intermittent hyperkalemia appeared to be correlated with severe hyperglycemia. Insulin dose was adjusted after admission, glucose still running high. Patient is homeless, adult protection agency involved, currently looking for placement.  Assessment and Plan: Uncontrolled type 2 diabetes with hyperglycemia alternating with hypoglycemia. Hyponatremia, mainly pseudohyponatremia. Hyperkalemia. Glucose still labile, running high today.  We will continue current regimen, will adjust dose if glucose persistently high.   History of stroke. Continue current regimen.   Insomnia. Slept better.   Anxiety and depression. Restless leg syndrome. Continue mirtazapine and Requip.   Severe protein calorie malnutrition. On mirtazapine.   Status post colostomy. Continue to follow.   Pancreatic mass. Follow-up with oncology in [facility].   Coccyx stage II decubitus ulcer. Continue to follow.      Subjective:  Patient doing well, currently no complaints.  Physical Exam: Vitals:   10/06/21 0740 10/06/21 1528 10/06/21 1953 10/07/21 0726  BP: (!) 118/90 104/83 111/86 105/71  Pulse: 79 (!)  105 98 96  Resp: '18 18 16 17  '$ Temp: 97.7 F (36.5 C) 98.7 F (37.1 C) 98.6 F (37 C) 98.2 F (36.8 C)  TempSrc: Oral Oral Oral   SpO2: 94% 96% 98% 96%  Weight:      Height:       General exam: Appears calm and comfortable  Respiratory system: Clear to auscultation. Respiratory effort normal. Cardiovascular system: S1 & S2 heard, RRR. No JVD, murmurs, rubs, gallops or clicks. No pedal edema. Gastrointestinal system: Abdomen is nondistended, soft and nontender. No organomegaly or masses felt. Normal bowel sounds heard. Central nervous system: Alert and oriented. No focal neurological deficits. Extremities: Symmetric 5 x 5 power. Skin: No rashes, lesions or ulcers Psychiatry: Judgement and insight appear normal. Mood & affect appropriate.   Data Reviewed:  Glucoses trends reviewed.  Family Communication: None  Disposition: Status is: Inpatient Remains inpatient appropriate because: No discharge option yet.  Planned Discharge Destination:  TBD    Time spent: 28 minutes  Author: Sharen Hones, MD 10/07/2021 11:30 AM  For on call review [email or web address].

## 2021-10-08 DIAGNOSIS — E11649 Type 2 diabetes mellitus with hypoglycemia without coma: Secondary | ICD-10-CM | POA: Diagnosis not present

## 2021-10-08 DIAGNOSIS — Z794 Long term (current) use of insulin: Secondary | ICD-10-CM | POA: Diagnosis not present

## 2021-10-08 DIAGNOSIS — I10 Essential (primary) hypertension: Secondary | ICD-10-CM | POA: Diagnosis not present

## 2021-10-08 LAB — GLUCOSE, CAPILLARY
Glucose-Capillary: 323 mg/dL — ABNORMAL HIGH (ref 70–99)
Glucose-Capillary: 147 mg/dL — ABNORMAL HIGH (ref 70–99)
Glucose-Capillary: 124 mg/dL — ABNORMAL HIGH (ref 70–99)
Glucose-Capillary: 134 mg/dL — ABNORMAL HIGH (ref 70–99)

## 2021-10-08 NOTE — Progress Notes (Signed)
Mobility Specialist - Progress Note   10/08/21 1027  Mobility  Activity Ambulated independently in hallway  Level of Pump Back None  Distance Ambulated (ft) 480 ft  Activity Response Tolerated well  $Mobility charge 1 Mobility    Merrily Brittle Mobility Specialist 10/08/21, 4:28 PM

## 2021-10-08 NOTE — Progress Notes (Signed)
  Progress Note   [name] ZOX:[id number] DOB: 1960/11/24 DOA: 09/25/2021     11 DOS: the patient was seen and examined on 10/08/2021   Brief hospital course: Ms. 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 Serum sodium is 128, potassium 6.3, chloride of 100, bicarb 20, BUN of 25, serum creatinine of 0.99, GFR greater than 60, nonfasting blood glucose 568, WBC 7.1, hemoglobin 10.8, platelets of 449. Patient had multiple admissions over the last 2 months, glucose was labile with severe hyperglycemia alternating with hypoglycemia.  She also had intermittent hyperkalemia appeared to be correlated with severe hyperglycemia. Insulin dose was adjusted after admission, glucose still running high. Patient is homeless, adult protection agency involved, currently looking for placement.  Assessment and Plan: Uncontrolled type 2 diabetes with hyperglycemia alternating with hypoglycemia. Hyponatremia, mainly pseudohyponatremia. Hyperkalemia. Patient keeps asking for food, she is not compliant with her diet. Glucose running higher, I will continue current regimen for today.  May need to increase dose of her long-acting insulin tomorrow if glucose still running high.   History of stroke. Continue current regimen without changes.   Insomnia. Improved.   Anxiety and depression. Restless leg syndrome. Continue mirtazapine and Requip.   Severe protein calorie malnutrition. On mirtazapine.   Status post colostomy. Continue to follow.   Pancreatic mass. Follow-up with oncology in [facility].   Coccyx stage II decubitus ulcer. Continue to follow.      Subjective:  Patient doing well, always feel hungry.  No shortness of  breath.  Physical Exam: Vitals:   10/07/21 1615 10/07/21 1957 10/08/21 0408 10/08/21 0734  BP: (!) 128/99 114/87 (!) 129/92 115/84  Pulse: 97 (!) 104 87 96  Resp: '16 18 18 19  '$ Temp: 98.7 F (37.1 C) 98.5 F (36.9 C) 98.3 F (36.8 C)   TempSrc:  Oral    SpO2: 99% 100% 100% 99%  Weight:      Height:       General exam: Appears calm and comfortable  Respiratory system: Clear to auscultation. Respiratory effort normal. Cardiovascular system: S1 & S2 heard, RRR. No JVD, murmurs, rubs, gallops or clicks. No pedal edema. Gastrointestinal system: Abdomen is nondistended, soft and nontender. No organomegaly or masses felt. Normal bowel sounds heard. Central nervous system: Alert and oriented. No focal neurological deficits. Extremities: Symmetric 5 x 5 power. Skin: No rashes, lesions or ulcers Psychiatry: Judgement and insight appear normal. Mood & affect appropriate.   Data Reviewed:  Lab results reviewed.  Family Communication:   Disposition: Status is: Inpatient Remains inpatient appropriate because: No discharge option  Planned Discharge Destination:  TBD    Time spent: 25 minutes  Author: Sharen Hones, MD 10/08/2021 10:17 AM  For on call review [email or web address].

## 2021-10-09 DIAGNOSIS — Z794 Long term (current) use of insulin: Secondary | ICD-10-CM | POA: Diagnosis not present

## 2021-10-09 DIAGNOSIS — E871 Hypo-osmolality and hyponatremia: Secondary | ICD-10-CM | POA: Diagnosis not present

## 2021-10-09 DIAGNOSIS — E11649 Type 2 diabetes mellitus with hypoglycemia without coma: Secondary | ICD-10-CM | POA: Diagnosis not present

## 2021-10-09 LAB — GLUCOSE, CAPILLARY
Glucose-Capillary: 132 mg/dL — ABNORMAL HIGH (ref 70–99)
Glucose-Capillary: 179 mg/dL — ABNORMAL HIGH (ref 70–99)
Glucose-Capillary: 180 mg/dL — ABNORMAL HIGH (ref 70–99)
Glucose-Capillary: 175 mg/dL — ABNORMAL HIGH (ref 70–99)

## 2021-10-09 NOTE — Plan of Care (Signed)
  Problem: Coping: Goal: Ability to adjust to condition or change in health will improve Outcome: Progressing   Problem: Nutritional: Goal: Maintenance of adequate nutrition will improve Outcome: Progressing   Problem: Education: Goal: Knowledge of General Education information will improve Description: Including pain rating scale, medication(s)/side effects and non-pharmacologic comfort measures Outcome: Progressing   Problem: Health Behavior/Discharge Planning: Goal: Ability to manage health-related needs will improve Outcome: Progressing   Problem: Clinical Measurements: Goal: Ability to maintain clinical measurements within normal limits will improve Outcome: Progressing   Problem: Coping: Goal: Level of anxiety will decrease Outcome: Progressing   Problem: Safety: Goal: Ability to remain free from injury will improve Outcome: Progressing

## 2021-10-09 NOTE — TOC Progression Note (Signed)
Transition of Care [facility]) - Progression Note    Patient Details  Name: Brittany [name] MRN: [id number] Date of Birth: 09/25/1960  Transition of Care [facility]) CM/SW Contact  Royston Bake, RN,MHA,CCM Transition of Care Supervisor Phone Number: [phone] 10/09/2021, 3:38 PM  Clinical Narrative:    Voicemail left with Fara Olden Adult Service Program Manager with DSS [phone] placement and to escalate the Medicaid process for custodial care. Awaiting a callback.    Expected Discharge Plan: Long Term Acute Care (LTAC) Barriers to Discharge: Barriers Resolved  Expected Discharge Plan and Services Expected Discharge Plan: Long Term Acute Care (LTAC)   Discharge Planning Services: CM Consult Post Acute Care Choice: [place] arrangements for the past 2 months: Single Family Home Expected Discharge Date: 09/26/21                         HH Arranged: RN, PT, OT, Nurse's Aide [place] Agency: [place] ([place]), [place] Date [place]: 09/26/21   Representative spoke with at [place]: cheryl   Social Determinants of Health (SDOH) Interventions    Readmission Risk Interventions    09/22/2021    1:24 PM 03/06/2021    9:13 AM 02/16/2021    2:45 PM  Readmission Risk Prevention Plan  Transportation Screening Complete Complete Complete  Medication Review Press photographer) Complete Complete Complete  PCP or Specialist appointment within 3-5 days of discharge Complete Complete   HRI or [place] Complete    SW Recovery Care/Counseling Consult Complete  Complete  Palliative Care Screening Not Applicable Not Applicable Not [place] Complete Not Applicable Not Applicable

## 2021-10-09 NOTE — Progress Notes (Signed)
  Progress Note   [name]:[id number] DOB: 1960-08-26 DOA: 09/25/2021     12 DOS: the patient was seen and examined on 10/09/2021   Brief hospital course: Brittany [name] is a 61 year old female, with hyperlipidemia, poorly controlled insulin dependent diabetes, history of pancreatic cancer status post pancreectomy and splenectomy, colostomy status, restless leg syndrome, who presents emergency department for chief concerns of nausea and vomiting. Serum sodium is 128, potassium 6.3, chloride of 100, bicarb 20, BUN of 25, serum creatinine of 0.99, GFR greater than 60, nonfasting blood glucose 568, WBC 7.1, hemoglobin 10.8, platelets of 449. Patient had multiple admissions over the last 2 months, glucose was labile with severe hyperglycemia alternating with hypoglycemia.  She also had intermittent hyperkalemia appeared to be correlated with severe hyperglycemia. Insulin dose was adjusted after admission, glucose still running high. Patient is homeless, adult protection agency involved, currently looking for placement.  Assessment and Plan: Uncontrolled type 2 diabetes with hyperglycemia alternating with hypoglycemia. Hyponatremia, mainly pseudohyponatremia. Hyperkalemia. Glucose running better again today, no need to adjust insulin dose.   History of stroke. No change in treatment plan.   Insomnia. Improved.   Anxiety and depression. Restless leg syndrome. Continue mirtazapine and Requip.   Severe protein calorie malnutrition. On mirtazapine.   Status post colostomy. Continue to follow.   Pancreatic mass. Follow-up with oncology in [facility].   Coccyx stage II decubitus ulcer. Continue to follow.        Subjective:  Patient doing well, no complaints.  Physical Exam: Vitals:   10/08/21 1644 10/08/21 2024 10/09/21 0454 10/09/21 0715  BP: 125/84 (!) 138/96 104/78 110/60  Pulse: 85 97 95 99  Resp: '18 18 16 18  '$ Temp: 97.8 F (36.6 C) 98.1 F (36.7  C)    TempSrc:  Oral    SpO2: 95% 100% 99% 98%  Weight:      Height:       General exam: Appears calm and comfortable  Respiratory system: Clear to auscultation. Respiratory effort normal. Cardiovascular system: S1 & S2 heard, RRR. No JVD, murmurs, rubs, gallops or clicks. No pedal edema. Gastrointestinal system: Abdomen is nondistended, soft and nontender. No organomegaly or masses felt. Normal bowel sounds heard. Central nervous system: Alert and oriented. No focal neurological deficits. Extremities: Symmetric 5 x 5 power. Skin: No rashes, lesions or ulcers Psychiatry: Judgement and insight appear normal. Mood & affect appropriate.   Data Reviewed:  Reviewed glucose trends.  Family Communication:   Disposition: Status is: Inpatient Remains inpatient appropriate because: Unsafe discharge  Planned Discharge Destination:  TBD    Time spent: 25 minutes  Author: Sharen Hones, MD 10/09/2021 10:45 AM  For on call review [email or web address].

## 2021-10-10 DIAGNOSIS — K8689 Other specified diseases of pancreas: Secondary | ICD-10-CM | POA: Diagnosis not present

## 2021-10-10 DIAGNOSIS — Z794 Long term (current) use of insulin: Secondary | ICD-10-CM | POA: Diagnosis not present

## 2021-10-10 DIAGNOSIS — E11649 Type 2 diabetes mellitus with hypoglycemia without coma: Secondary | ICD-10-CM | POA: Diagnosis not present

## 2021-10-10 DIAGNOSIS — I10 Essential (primary) hypertension: Secondary | ICD-10-CM | POA: Diagnosis not present

## 2021-10-10 LAB — GLUCOSE, CAPILLARY
Glucose-Capillary: 214 mg/dL — ABNORMAL HIGH (ref 70–99)
Glucose-Capillary: 269 mg/dL — ABNORMAL HIGH (ref 70–99)
Glucose-Capillary: 137 mg/dL — ABNORMAL HIGH (ref 70–99)
Glucose-Capillary: 122 mg/dL — ABNORMAL HIGH (ref 70–99)

## 2021-10-10 NOTE — Progress Notes (Signed)
Nutrition Follow-up  DOCUMENTATION CODES:   Severe malnutrition in context of chronic illness  INTERVENTION:   -Continue MVI with minerals daily -Continue double protein portions with meals -Continue 220 mg zinc sulfate daily x 14 days -Continue 500 mg vitamin C BID -Liberalize diet to carb modified  NUTRITION DIAGNOSIS:   Severe Malnutrition related to chronic illness (pancreatic cancer) as evidenced by moderate fat depletion, severe fat depletion, moderate muscle depletion, severe muscle depletion.  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  Reviewed I/O's: +290 ml x 24 hours and +1.9 L x 24 hours  Colostomy output: 350 ml x 24 hours   Pt remains with safety sitter secondary to elopement risk.   Pt continues with good appetite. Noted meal completions 100%.   Per TOC notes, working with DSS for placement and to escalation Medicaid process for custodial care.   Medications reviewed ad include vitamin C, remeron, melatonin, and zinc sulfate.   Labs reviewed: Na: 132, CBGS: 132-214 (inpatient orders for glycemic control are 0-15 units insulin aspart TID with meals, 0-5 units insulin aspart daily at bedtime, 3 units insulin aspart TID with meals, and 6 units insulin detemir BID).    Diet Order:   Diet Order             Diet renal/carb modified with fluid restriction Diet-HS Snack? Nothing; Room service appropriate? Yes; Fluid consistency: Thin  Diet effective now           Diet Carb Modified                   EDUCATION NEEDS:    No education needs have been identified at this time  Skin:  Skin Assessment: Skin Integrity Issues: Skin Integrity Issues:: Stage II Stage II: coccyx Stage III: -  Last BM:  10/01/21 (50 ml via colostomy)  Height:   Ht Readings from Last 1 Encounters:  09/25/21 5' (1.524 m)    Weight:   Wt Readings from Last 1 Encounters:  09/25/21 47 kg    Ideal Body Weight:  45.5 kg  BMI:  Body mass index is 20.24 kg/m.  Estimated Nutritional Needs:   Kcal:  1700-1900  Protein:  90-105 grams  Fluid:  > 1.7 L    Loistine Chance, RD, LDN, [place] Registered Dietitian II Certified Diabetes Care and Education Specialist Please refer to [facility] for RD and/or RD on-call/weekend/after hours pager

## 2021-10-10 NOTE — TOC Progression Note (Signed)
Transition of Care [facility]) - Progression Note    Patient Details  Name: KARTHIKA GLASPER MRN: [id number] Date of Birth: 22-Jan-1961  Transition [facility]) CM/SW Contact  Laurena Slimmer, RN Phone Number: 10/10/2021, 9:01 PM  Clinical Narrative:    Patient has bed offers will follow up with DSS for placement.   Expected Discharge Plan: Long Term Acute Care (LTAC) Barriers to Discharge: Barriers Resolved  Expected Discharge Plan and Services Expected Discharge Plan: Long Term Acute Care (LTAC)   Discharge Planning Services: CM Consult Post Acute Care Choice: [place] arrangements for the past 2 months: Single Family Home Expected Discharge Date: 09/26/21                         HH Arranged: RN, PT, OT, Nurse's Aide [place] Agency: [place] ([place]), [place] Date [place]: 09/26/21   Representative spoke with at [place]: cheryl   Social Determinants of Health (SDOH) Interventions    Readmission Risk Interventions    09/22/2021    1:24 PM 03/06/2021    9:13 AM 02/16/2021    2:45 PM  Readmission Risk Prevention Plan  Transportation Screening Complete Complete Complete  Medication Review Press photographer) Complete Complete Complete  PCP or Specialist appointment within 3-5 days of discharge Complete Complete   HRI or [place] Complete    SW Recovery Care/Counseling Consult Complete  Complete  Palliative Care Screening Not Applicable Not Applicable Not [place] Complete Not Applicable Not Applicable

## 2021-10-10 NOTE — Progress Notes (Signed)
[facility]) Hospital Liaison note:  This is a pending outpatient-based Palliative Care patient. Will continue to follow for disposition.  Please call with any outpatient palliative questions or concerns.  Thank you, Lorelee Market, LPN [facility] Liaison [phone]

## 2021-10-10 NOTE — Progress Notes (Signed)
  Progress Note   [name] VCB:[id number] DOB: May 26, 1960 DOA: 09/25/2021     13 DOS: the patient was seen and examined on 10/10/2021   Brief hospital course: Ms. Tionne Carelli is a 61 year old female, with hyperlipidemia, poorly controlled insulin dependent diabetes, history of pancreatic cancer status post pancreectomy and splenectomy, colostomy status, restless leg syndrome, who presents emergency department for chief concerns of nausea and vomiting. Serum sodium is 128, potassium 6.3, chloride of 100, bicarb 20, BUN of 25, serum creatinine of 0.99, GFR greater than 60, nonfasting blood glucose 568, WBC 7.1, hemoglobin 10.8, platelets of 449. Patient had multiple admissions over the last 2 months, glucose was labile with severe hyperglycemia alternating with hypoglycemia.  She also had intermittent hyperkalemia appeared to be correlated with severe hyperglycemia. Insulin dose was adjusted after admission, glucose is better.  Patient is homeless, adult protection agency involved, currently looking for placement. Has a sitter for elopement risk.  Assessment and Plan: Uncontrolled type 2 diabetes with hyperglycemia alternating with hypoglycemia. Hyponatremia, mainly pseudohyponatremia. Hyperkalemia. Condition has improved.  No additional hypoglycemia, occasional hyperglycemia.  I did not adjust dose for occasional hyperglycemia due to high risk of hypoglycemia.   History of stroke. Continue statin, aspirin and Plavix.   Insomnia. Improved.   Anxiety and depression. Restless leg syndrome. Continue mirtazapine and Requip.   Severe protein calorie malnutrition. On mirtazapine. Appetite has improved significantly.   Status post colostomy. Continue to follow.   Pancreatic mass. Follow-up with oncology in [facility].   Coccyx stage II decubitus ulcer. Continue to follow.      Subjective:  Patient has no complaints.  Physical Exam: Vitals:   10/09/21 1617  10/09/21 2146 10/10/21 0456 10/10/21 0731  BP: 133/80 106/83 123/83 103/83  Pulse: 100 (!) 110 (!) 109 (!) 101  Resp: '20 19 16 20  '$ Temp: 98.6 F (37 C) 99 F (37.2 C) 98.3 F (36.8 C) (!) 97.5 F (36.4 C)  TempSrc: Axillary Oral    SpO2: 99% 97% 96% 99%  Weight:      Height:       General exam: Appears calm and comfortable, severely malnourished. Respiratory system: Clear to auscultation. Respiratory effort normal. Cardiovascular system: S1 & S2 heard, RRR. No JVD, murmurs, rubs, gallops or clicks. No pedal edema. Gastrointestinal system: Abdomen is nondistended, soft and nontender. No organomegaly or masses felt. Normal bowel sounds heard. Central nervous system: Alert and oriented x2. No focal neurological deficits. Extremities: Symmetric 5 x 5 power. Skin: No rashes, lesions or ulcers Psychiatry: Judgement and insight appear normal. Mood & affect appropriate.   Data Reviewed:  Glucose is running better.  Family Communication: None  Disposition: Status is: Inpatient Remains inpatient appropriate because: Unsafe discharge.  Planned Discharge Destination: LTAC    Time spent: 35 minutes  Author: Sharen Hones, MD 10/10/2021 9:45 AM  For on call review [email or web address].

## 2021-10-11 DIAGNOSIS — E11649 Type 2 diabetes mellitus with hypoglycemia without coma: Secondary | ICD-10-CM | POA: Diagnosis not present

## 2021-10-11 DIAGNOSIS — E875 Hyperkalemia: Secondary | ICD-10-CM | POA: Diagnosis not present

## 2021-10-11 DIAGNOSIS — E871 Hypo-osmolality and hyponatremia: Secondary | ICD-10-CM | POA: Diagnosis not present

## 2021-10-11 DIAGNOSIS — I10 Essential (primary) hypertension: Secondary | ICD-10-CM | POA: Diagnosis not present

## 2021-10-11 LAB — GLUCOSE, CAPILLARY
Glucose-Capillary: 376 mg/dL — ABNORMAL HIGH (ref 70–99)
Glucose-Capillary: 415 mg/dL — ABNORMAL HIGH (ref 70–99)
Glucose-Capillary: 82 mg/dL (ref 70–99)
Glucose-Capillary: 100 mg/dL — ABNORMAL HIGH (ref 70–99)
Glucose-Capillary: 247 mg/dL — ABNORMAL HIGH (ref 70–99)
Glucose-Capillary: 339 mg/dL — ABNORMAL HIGH (ref 70–99)

## 2021-10-11 MED ORDER — DILTIAZEM HCL ER COATED BEADS 120 MG PO CP24
120.0000 mg | ORAL_CAPSULE | Freq: Every day | ORAL | Status: DC
  Administered 2021-10-11 – 2021-10-19 (×7): 120 mg via ORAL
  Filled 2021-10-11 (×10): qty 1

## 2021-10-11 NOTE — Progress Notes (Signed)
Progress Note   [name] OAC:[id number] DOB: August 16, 1960 DOA: 09/25/2021     14 DOS: the patient was seen and examined on 10/11/2021   Brief hospital course: 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 Serum sodium is 128, potassium 6.3, chloride of 100, bicarb 20, BUN of 25, serum creatinine of 0.99, GFR greater than 60, nonfasting blood glucose 568, WBC 7.1, hemoglobin 10.8, platelets of 449. Patient had multiple admissions over the last 2 months, glucose was labile with severe hyperglycemia alternating with hypoglycemia.  She also had intermittent hyperkalemia appeared to be correlated with severe hyperglycemia. Insulin dose was adjusted after admission, glucose is better.  Patient is homeless, adult protection agency involved, currently looking for placement. Has a sitter for elopement risk.  8/30: Waiting for placement.  Blood sugar labile   Assessment and Plan: * Uncontrolled type 2 diabetes mellitus with hypoglycemia, with long-term current use of insulin (HCC) Blood sugar control remains big challenge.  They remain labile ranging from 80s to 450s.  Patient also has very specific diet request which makes it challenging  Hyponatremia Improved.  This is likely pseudohyponatremia secondary to high sugars.  Hyperkalemia Now resolved.  Likely hyperkalemia secondary to elevated sugars.  Type 2 diabetes mellitus with hyperlipidemia (HCC) Continue insulin Levemir 6 units twice daily and NovoLog 3 units 3 times daily with each meals.  Adjust as needed.    Essential hypertension-resolved as of 09/26/2021 - Labetalol 5 mg IV every 3 hours as needed for SBP greater than 180, 4 doses ordered  History of stroke Continue  atorvastatin 40 mg, aspirin 81 mg  Anxiety and depression Mirtazapine 15 mg nightly.  Restless leg syndrome Ropinirole 0.5 mg nightly   HLD (hyperlipidemia) Continue atorvastatin 40 mg daily  Protein-calorie malnutrition, severe Mirtazapine 15 mg nightly   Status post colostomy ([place]) Colostomy bag in place   Tachycardia For better heart rate control, will resume Cardizem CD  Insomnia Continue Seroquel at night  Decubital ulcer Coccyx stage II, present on admission.  See full description below  Pancreatic mass Seen on prior imaging of the abdomen.  Outpatient follow-up with her oncologist.  Keep appointment with Dr. Zenia Resides at [facility] on 11/24/2021  Tobacco dependence Nicotine patch         Subjective: She has been requesting to eat something be on her ordered diet.  Has been ambulating in the hallways without any difficulties.  She hates symptoms of low blood sugar and wants to make sure she can have more diet  Physical Exam: Vitals:   10/11/21 0747 10/11/21 0836 10/11/21 1137 10/11/21 1537  BP:   (!) 146/81 (!) 130/93  Pulse:   (!) 110 (!) 102  Resp:   18 18  Temp: (!) 97.5 F (36.4 C)  (!) 97.4 F (36.3 C) 98.2 F (36.8 C)  TempSrc:      SpO2:   100% 99%  Weight:  51.8 kg    Height:       61 year old female sitting in the bed without any difficulties, sitter at bedside Lungs clear to auscultation bilaterally Cardiovascular regular rate and rhythm Abdomen soft, benign Neuro alert and oriented, nonfocal Psychiatry anxious Data Reviewed:  There are no new results to review at this time.  Family Communication: None  Disposition: Status is: Inpatient Remains  inpatient appropriate because: Waiting for placement   Planned Discharge Destination: Group home    DVT prophylaxis-SCDs Time spent: 35 minutes  Author: Max Sane, MD 10/11/2021 4:56 PM  For on call review [email or web address].

## 2021-10-11 NOTE — Progress Notes (Addendum)
10;39 am- Talked to [place] with DSS; Her assessment is scheduled today at 11:45 am for [facility].   3:43 pm- Talked to [place]  with [place]( [phone]); she stated that the meeting today went well and she is awaiting to hear back from the administrator. Melanee Left RN,MHA,CCM Transition of Care Supervisor [phone]

## 2021-10-12 DIAGNOSIS — E11649 Type 2 diabetes mellitus with hypoglycemia without coma: Secondary | ICD-10-CM | POA: Diagnosis not present

## 2021-10-12 DIAGNOSIS — E875 Hyperkalemia: Secondary | ICD-10-CM | POA: Diagnosis not present

## 2021-10-12 DIAGNOSIS — G9341 Metabolic encephalopathy: Secondary | ICD-10-CM

## 2021-10-12 DIAGNOSIS — E871 Hypo-osmolality and hyponatremia: Secondary | ICD-10-CM | POA: Diagnosis not present

## 2021-10-12 LAB — GLUCOSE, CAPILLARY
Glucose-Capillary: 318 mg/dL — ABNORMAL HIGH (ref 70–99)
Glucose-Capillary: 559 mg/dL (ref 70–99)
Glucose-Capillary: 405 mg/dL — ABNORMAL HIGH (ref 70–99)
Glucose-Capillary: 552 mg/dL (ref 70–99)
Glucose-Capillary: 82 mg/dL (ref 70–99)
Glucose-Capillary: 57 mg/dL — ABNORMAL LOW (ref 70–99)
Glucose-Capillary: 118 mg/dL — ABNORMAL HIGH (ref 70–99)
Glucose-Capillary: 224 mg/dL — ABNORMAL HIGH (ref 70–99)
Glucose-Capillary: 401 mg/dL — ABNORMAL HIGH (ref 70–99)
Glucose-Capillary: 173 mg/dL — ABNORMAL HIGH (ref 70–99)

## 2021-10-12 LAB — CBC
HCT: 33.6 % — ABNORMAL LOW (ref 36.0–46.0)
Hemoglobin: 10.6 g/dL — ABNORMAL LOW (ref 12.0–15.0)
MCH: 28.7 pg (ref 26.0–34.0)
MCHC: 31.5 g/dL (ref 30.0–36.0)
MCV: 91.1 fL (ref 80.0–100.0)
Platelets: 491 10*3/uL — ABNORMAL HIGH (ref 150–400)
RBC: 3.69 MIL/uL — ABNORMAL LOW (ref 3.87–5.11)
RDW: 16.8 % — ABNORMAL HIGH (ref 11.5–15.5)
WBC: 8.6 10*3/uL (ref 4.0–10.5)
nRBC: 0.2 % (ref 0.0–0.2)

## 2021-10-12 LAB — BASIC METABOLIC PANEL
Anion gap: 6 (ref 5–15)
BUN: 27 mg/dL — ABNORMAL HIGH (ref 8–23)
CO2: 24 mmol/L (ref 22–32)
Calcium: 9.5 mg/dL (ref 8.9–10.3)
Chloride: 114 mmol/L — ABNORMAL HIGH (ref 98–111)
Creatinine, Ser: 0.6 mg/dL (ref 0.44–1.00)
GFR, Estimated: >60 mL/min (ref >60)
Glucose, Bld: 33 mg/dL — CL (ref 70–99)
Potassium: 3.8 mmol/L (ref 3.5–5.1)
Sodium: 144 mmol/L (ref 135–145)

## 2021-10-12 MED ORDER — QUETIAPINE FUMARATE 25 MG PO TABS
25.0000 mg | ORAL_TABLET | Freq: Every day | ORAL | Status: DC
  Administered 2021-10-12 – 2021-10-18 (×7): 25 mg via ORAL
  Filled 2021-10-12 (×7): qty 1

## 2021-10-12 MED ORDER — INSULIN ASPART 100 UNIT/ML IJ SOLN
25.0000 [IU] | Freq: Once | INTRAMUSCULAR | Status: AC
  Administered 2021-10-12: 25 [IU] via SUBCUTANEOUS
  Filled 2021-10-12: qty 1

## 2021-10-12 NOTE — Assessment & Plan Note (Signed)
Improved.  This is likely pseudohyponatremia secondary to high sugars

## 2021-10-12 NOTE — TOC Progression Note (Signed)
Transition of Care [facility]) - Progression Note    Patient Details  Name: Brittany [name] MRN: [id number] Date of Birth: 07-Jul-1960  Transition of Care [facility]) CM/SW Contact  Royston Bake, RN,MHA,CCM Transition of Care Supervisor Phone Number: [phone] 10/12/2021, 1:26 PM  Clinical Narrative:     Talked to [name]  with [name]); pt possibly will be accepted at [name] on Sept 5, 2023 at 10:30am. She will need a TB test prior to admission. Attending MD made aware. TOC will continue to follow for progression of care.  Expected Discharge Plan: Long Term Acute Care (LTAC) Barriers to Discharge: Barriers Resolved  Expected Discharge Plan and Services Expected Discharge Plan: Long Term Acute Care (LTAC)   Discharge Planning Services: CM Consult Post Acute Care Choice: [place] arrangements for the past 2 months: Single Family Home Expected Discharge Date: 09/26/21                         HH Arranged: RN, PT, OT, Nurse's Aide [place] Agency: [place] ([place]), [place] Date [place]: 09/26/21   Representative spoke with at [place]: cheryl   Social Determinants of Health (SDOH) Interventions    Readmission Risk Interventions    09/22/2021    1:24 PM 03/06/2021    9:13 AM 02/16/2021    2:45 PM  Readmission Risk Prevention Plan  Transportation Screening Complete Complete Complete  Medication Review Press photographer) Complete Complete Complete  PCP or Specialist appointment within 3-5 days of discharge Complete Complete   HRI or [place] Complete    SW Recovery Care/Counseling Consult Complete  Complete  Palliative Care Screening Not Applicable Not Applicable Not [place] Complete Not Applicable Not Applicable

## 2021-10-12 NOTE — Progress Notes (Signed)
Patient up ambulating in hallway with tech.

## 2021-10-12 NOTE — Progress Notes (Signed)
Patient drink juice and eating carbohydrates in response to low glucose. Will re check and continue to monitor.

## 2021-10-12 NOTE — Assessment & Plan Note (Signed)
Restarted Cardizem CD yesterday with much better rate control

## 2021-10-12 NOTE — Progress Notes (Signed)
Progress Note   [name] ZOX:[id number] DOB: 1960/03/10 DOA: 09/25/2021     15 DOS: the patient was seen and examined on 10/12/2021   Brief hospital course: 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 Serum sodium is 128, potassium 6.3, chloride of 100, bicarb 20, BUN of 25, serum creatinine of 0.99, GFR greater than 60, nonfasting blood glucose 568, WBC 7.1, hemoglobin 10.8, platelets of 449. Patient had multiple admissions over the last 2 months, glucose was labile with severe hyperglycemia alternating with hypoglycemia.  She also had intermittent hyperkalemia appeared to be correlated with severe hyperglycemia. Insulin dose was adjusted after admission, glucose is better.  Patient is homeless, adult protection agency involved, currently looking for placement. Has a sitter for elopement risk.  8/30: Waiting for placement.  Blood sugar labile 8/31: Blood sugar continues to stay labile.  Was lethargic this morning   Assessment and Plan: * Uncontrolled type 2 diabetes mellitus with hypoglycemia, with long-term current use of insulin (HCC) Blood sugar control remains big challenge.  They remain labile ranging from 80s to 550s.  Patient also has very specific diet request which makes it challenging.  Diabetic nurse following  Hyponatremia Improved.  This is likely pseudohyponatremia secondary to high sugars  Hyperkalemia Now resolved.  Likely hyperkalemia secondary to elevated sugars  Acute metabolic encephalopathy Patient was quite lethargic this morning.  She wakes up to verbal commands but falls back to sleep easily.  This is likely due to combination of Seroquel, Remeron, Risperdal and small dose of Ativan that she received  with component of insomnia.  We will monitor her closely I have cut back on the dose of Seroquel from 50 to 25 mg for now  Type 2 diabetes mellitus with hyperlipidemia (HCC) Continue insulin Levemir 6 units twice daily and NovoLog 3 units 3 times daily with each meals.  Adjust as needed.  DM nurse following    Essential hypertension-resolved as of 09/26/2021 - Labetalol 5 mg IV every 3 hours as needed for SBP greater than 180, 4 doses ordered  History of stroke Continue atorvastatin 40 mg, aspirin 81 mg.  Anxiety and depression Mirtazapine 15 mg nightly.  Cut back Seroquel from 50 to 25 mg daily as she was too lethargic today  Restless leg syndrome Ropinirole 0.5 mg nightly.  HLD (hyperlipidemia) Continue atorvastatin 40 mg daily.  Protein-calorie malnutrition, severe Mirtazapine 15 mg nightly.  Status post colostomy (HCC)-resolved as of 10/12/2021 Colostomy bag in place.   Tachycardia Restarted Cardizem CD yesterday with much better rate control  Insomnia Continue Seroquel at night with a reduced dose of 25 mg to 50  Decubital ulcer Coccyx stage II, present on admission.  See full description below  Pancreatic mass Seen on prior imaging of the abdomen.  Outpatient follow-up with her oncologist.  Keep appointment with Dr. Zenia Resides at [facility] on 11/24/2021.  Tobacco dependence Nicotine patch.        Subjective: Has been lethargic this morning with blood sugar in 550s earlier but now improving.  Physical Exam: Vitals:   10/11/21 2052 10/11/21 2054 10/12/21 0400 10/12/21 0829  BP: (!) 143/104 (!) 147/99 (!) 140/78 116/67  Pulse: 88 80 90 100  Resp: '18  20 18  '$ Temp: (!) 97.4 F (36.3 C) 97.6 F (36.4 C) 97.9 F (  36.6 C) 97.8 F (36.6 C)  TempSrc:  Oral Oral Axillary  SpO2: 100% 100% 98% 95%  Weight:      Height:       61 year old female sitting in the bed without any difficulties Lungs clear to auscultation bilaterally Cardiovascular regular rate and  rhythm Abdomen soft, benign Neuro lethargic, wakes up to verbal commands but falls back to sleep easily.  Nonfocal Psychiatry -unable to assess as she is very sleepy and lethargic Data Reviewed:  There are no new results to review at this time.  Family Communication: None  Disposition: Status is: Inpatient Remains inpatient appropriate because: Waiting for placement and better blood sugar control   Planned Discharge Destination: Skilled nursing facility    DVT prophylaxis-SCDs Time spent: 35 minutes  Author: Max Sane, MD 10/12/2021 11:17 AM  For on call review [email or web address].

## 2021-10-12 NOTE — Assessment & Plan Note (Signed)
Mirtazapine 15 mg nightly.

## 2021-10-12 NOTE — Assessment & Plan Note (Signed)
-  Nicotine patch 

## 2021-10-12 NOTE — Assessment & Plan Note (Signed)
Mirtazapine 15 mg nightly.  Cut back Seroquel from 50 to 25 mg daily as she was too lethargic today

## 2021-10-12 NOTE — Assessment & Plan Note (Signed)
Continue atorvastatin 40 mg daily.  

## 2021-10-12 NOTE — Assessment & Plan Note (Signed)
Colostomy bag in place.

## 2021-10-12 NOTE — Assessment & Plan Note (Signed)
Seen on prior imaging of the abdomen.  Outpatient follow-up with her oncologist.  Keep appointment with Dr. Zenia Resides at [facility] on 11/24/2021.

## 2021-10-12 NOTE — Assessment & Plan Note (Signed)
Continue atorvastatin 40 mg, aspirin 81 mg.

## 2021-10-12 NOTE — Assessment & Plan Note (Signed)
Continue insulin Levemir 6 units twice daily and NovoLog 3 units 3 times daily with each meals.  Adjust as needed.  DM nurse following

## 2021-10-12 NOTE — Inpatient Diabetes Management (Signed)
Inpatient Diabetes Program Recommendations  AACE/ADA: New Consensus Statement on Inpatient Glycemic Control (2015)  Target Ranges:  Prepandial:   less than 140 mg/dL      Peak postprandial:   less than 180 mg/dL (1-2 hours)      Critically ill patients:  140 - 180 mg/dL   Lab Results  Component Value Date   GLUCAP 118 (H) 10/12/2021   HGBA1C 15.0 (H) 09/04/2021    Review of Glycemic Control  Latest Reference Range & Units 10/12/21 08:52 10/12/21 09:22 10/12/21 10:51 10/12/21 12:25 10/12/21 13:05  Glucose-Capillary 70 - 99 mg/dL 552 (HH)  Novolog 25 units  405 (H) 82 57 (L) 118 (H)  (HH): Data is critically high (H): Data is abnormally high (L): Data is abnormally low  Latest Reference Range & Units 10/12/21 12:16  Glucose 70 - 99 mg/dL 33 (LL)  (LL): Data is critically low  Diabetes history: DM1(does not make insulin.  Needs correction, basal and meal coverage)   Outpatient Diabetes medications:  Lantus 8 units QD Humalog 0-9 units TID & HS   Current orders for Inpatient glycemic control:  Levemir 6 units BID Novolog 0-15 units TID and 0-5 QHS, Novolog 3 units TID  Inpatient Diabetes Program Recommendations:    Novolog 0-9 units TID to avoid overcorrection.    Will continue to follow while inpatient.  Thank you, Reche Dixon, MSN, [place] Diabetes Coordinator Inpatient Diabetes Program [phone] (team pager from 8a-5p)

## 2021-10-12 NOTE — Progress Notes (Signed)
Patient remains lethargic, but arousable. Will continue to monitor.

## 2021-10-12 NOTE — Assessment & Plan Note (Addendum)
Blood sugar control remains big challenge.  They remain labile ranging from 80s to 550s.  Patient also has very specific diet request which makes it challenging.  Diabetic nurse following

## 2021-10-12 NOTE — Progress Notes (Signed)
Patient arousable to voice, but quickly falls back asleep. Patient fully awoken once tactile stimulation applied to sternal area (not full sternal rub). Able to drink grape juice without difficulty.

## 2021-10-12 NOTE — Assessment & Plan Note (Signed)
Now resolved.  Likely hyperkalemia secondary to elevated sugars

## 2021-10-12 NOTE — Assessment & Plan Note (Signed)
Ropinirole 0.5 mg nightly.

## 2021-10-12 NOTE — Assessment & Plan Note (Addendum)
Patient was quite lethargic this morning.  She wakes up to verbal commands but falls back to sleep easily.  This is likely due to combination of Seroquel, Remeron, Risperdal and small dose of Ativan that she received with component of insomnia.  We will monitor her closely I have cut back on the dose of Seroquel from 50 to 25 mg for now

## 2021-10-12 NOTE — Assessment & Plan Note (Signed)
Continue Seroquel at night with a reduced dose of 25 mg to 50

## 2021-10-12 NOTE — Inpatient Diabetes Management (Signed)
Inpatient Diabetes Program Recommendations  AACE/ADA: New Consensus Statement on Inpatient Glycemic Control (2015)  Target Ranges:  Prepandial:   less than 140 mg/dL      Peak postprandial:   less than 180 mg/dL (1-2 hours)      Critically ill patients:  140 - 180 mg/dL   Lab Results  Component Value Date   GLUCAP 405 (H) 10/12/2021   HGBA1C 15.0 (H) 09/04/2021    Review of Glycemic Control  Latest Reference Range & Units 10/11/21 13:11 10/11/21 17:03 10/11/21 19:55 10/12/21 01:47 10/12/21 08:24 10/12/21 08:52 10/12/21 09:22  Glucose-Capillary 70 - 99 mg/dL 100 (H) 247 (H) 339 (H) 318 (H) 559 (HH) 552 (HH) 405 (H)  (HH): Data is critically high (H): Data is abnormally high   Diabetes history: DM1(does not make insulin.  Needs correction, basal and meal coverage)  Outpatient Diabetes medications:  Lantus 8 units QD Humalog 0-9 units TID & HS  Current orders for Inpatient glycemic control:  Levemir 6 units BID Novolog 0-15 units TID and 0-5 QHS, Novolog 3 units TID  Received referral for DM and insulin teaching.  Patient is well known to the diabetes team.  She continues to eat over night and have labile blood sugars.  Became minimally responsive this morning.  CBG was 559 mg/dL.    Attempted to speak with patient.  She would not respond to my voice nor stimuli.  Even normal breathing.  Will notify Tiffany, RN.  Please page our team when she is appropriate for teaching.   Will continue to follow while inpatient.  Thank you, Reche Dixon, MSN, [place] Diabetes Coordinator Inpatient Diabetes Program [phone] (team pager from 8a-5p)

## 2021-10-12 NOTE — Progress Notes (Signed)
Dr. Manuella Ghazi notified patient's blood glucose level critically elevated and patient minimally responsive. VSS. Order for STAT dose of subcu insulin obtained and given. Patient noted to have colostomy apparatus removed from ostomy site and fecal material all over. Patient also grossly incont of urine. Patient bathed and noted to be slightly more responsive following bath. Will continue to monitor.

## 2021-10-12 NOTE — Progress Notes (Signed)
Received call from lab regarding critical glucose of 33. Received follow up call from lab stating results looked skewed. Beside glucose obtained and patient now awake and alert eating lunch tray. Will continue to monitor.

## 2021-10-13 DIAGNOSIS — Z794 Long term (current) use of insulin: Secondary | ICD-10-CM | POA: Diagnosis not present

## 2021-10-13 DIAGNOSIS — E11649 Type 2 diabetes mellitus with hypoglycemia without coma: Secondary | ICD-10-CM | POA: Diagnosis not present

## 2021-10-13 LAB — CBC
HCT: 33.2 % — ABNORMAL LOW (ref 36.0–46.0)
Hemoglobin: 10.5 g/dL — ABNORMAL LOW (ref 12.0–15.0)
MCH: 29.3 pg (ref 26.0–34.0)
MCHC: 31.6 g/dL (ref 30.0–36.0)
MCV: 92.7 fL (ref 80.0–100.0)
Platelets: 463 10*3/uL — ABNORMAL HIGH (ref 150–400)
RBC: 3.58 MIL/uL — ABNORMAL LOW (ref 3.87–5.11)
RDW: 17 % — ABNORMAL HIGH (ref 11.5–15.5)
WBC: 7.9 10*3/uL (ref 4.0–10.5)
nRBC: 0.3 % — ABNORMAL HIGH (ref 0.0–0.2)

## 2021-10-13 LAB — GLUCOSE, CAPILLARY
Glucose-Capillary: 205 mg/dL — ABNORMAL HIGH (ref 70–99)
Glucose-Capillary: 480 mg/dL — ABNORMAL HIGH (ref 70–99)
Glucose-Capillary: 177 mg/dL — ABNORMAL HIGH (ref 70–99)
Glucose-Capillary: 216 mg/dL — ABNORMAL HIGH (ref 70–99)
Glucose-Capillary: 353 mg/dL — ABNORMAL HIGH (ref 70–99)

## 2021-10-13 MED ORDER — INSULIN ASPART 100 UNIT/ML IJ SOLN
0.0000 [IU] | Freq: Every day | INTRAMUSCULAR | Status: DC
  Administered 2021-10-13: 2 [IU] via SUBCUTANEOUS
  Administered 2021-10-14: 4 [IU] via SUBCUTANEOUS
  Administered 2021-10-15: 5 [IU] via SUBCUTANEOUS
  Administered 2021-10-16: 2 [IU] via SUBCUTANEOUS
  Administered 2021-10-17: 5 [IU] via SUBCUTANEOUS
  Administered 2021-10-18: 2 [IU] via SUBCUTANEOUS
  Filled 2021-10-13 (×6): qty 1

## 2021-10-13 MED ORDER — INSULIN ASPART 100 UNIT/ML IJ SOLN
0.0000 [IU] | Freq: Three times a day (TID) | INTRAMUSCULAR | Status: DC
  Administered 2021-10-13: 2 [IU] via SUBCUTANEOUS
  Administered 2021-10-14: 1 [IU] via SUBCUTANEOUS
  Administered 2021-10-14: 9 [IU] via SUBCUTANEOUS
  Administered 2021-10-14: 1 [IU] via SUBCUTANEOUS
  Administered 2021-10-15: 2 [IU] via SUBCUTANEOUS
  Administered 2021-10-15: 9 [IU] via SUBCUTANEOUS
  Administered 2021-10-15 – 2021-10-16 (×2): 5 [IU] via SUBCUTANEOUS
  Administered 2021-10-16: 3 [IU] via SUBCUTANEOUS
  Administered 2021-10-16: 2 [IU] via SUBCUTANEOUS
  Administered 2021-10-17: 3 [IU] via SUBCUTANEOUS
  Administered 2021-10-17: 2 [IU] via SUBCUTANEOUS
  Administered 2021-10-17: 1 [IU] via SUBCUTANEOUS
  Administered 2021-10-18: 9 [IU] via SUBCUTANEOUS
  Administered 2021-10-18: 2 [IU] via SUBCUTANEOUS
  Administered 2021-10-18: 3 [IU] via SUBCUTANEOUS
  Administered 2021-10-19: 5 [IU] via SUBCUTANEOUS
  Filled 2021-10-13 (×17): qty 1

## 2021-10-13 NOTE — Plan of Care (Signed)

## 2021-10-13 NOTE — Progress Notes (Signed)
Patient had been wandering hallway repeatedly asking for food and snacks. Sitter has gone for the night. Patient is not appropriate for tele sitter as she is ambulatory and will not stay in her room unless she is asleep and she has to be the tele sitter for 8 hours. Charge Nurse and nursing supervisor aware that no one is at bedside.  Nursing will continue to monitor for safety. Patient is currently in beds with bed alarm on and door open.

## 2021-10-13 NOTE — Progress Notes (Addendum)
Progress Note   [name] [id number]:[id number] DOB: Apr 07, 1960 DOA: 09/25/2021     16 DOS: the patient was seen and examined on 10/13/2021   Ms. 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 Serum sodium is 128, potassium 6.3, chloride of 100, bicarb 20, BUN of 25, serum creatinine of 0.99, GFR greater than 60, nonfasting blood glucose 568, WBC 7.1, hemoglobin 10.8, platelets of 449. Patient had multiple admissions over the last 2 months, glucose was labile with severe hyperglycemia alternating with hypoglycemia.  She also had intermittent hyperkalemia appeared to be correlated with severe hyperglycemia. Insulin dose was adjusted after admission, glucose is better.   Patient is homeless, adult protection agency involved, currently looking for placement. Has a sitter for elopement risk.   8/30: Waiting for placement.  Blood sugar labile 8/31: Blood sugar continues to stay labile.  Was lethargic this morning     Assessment and Plan: * Uncontrolled type 2 diabetes mellitus with hypoglycemia, with long-term current use of insulin (HCC) Blood sugar control remains big challenge.  They remain labile ranging from 80s to 550s.  Patient also has very specific diet request which makes it challenging.  Diabetic nurse following. I have adjusted her sliding scale today for greater sensitivity.  Hyperkalemia Now resolved.  Likely hyperkalemia secondary to elevated sugars  Acute metabolic encephalopathy Appears improved since yesterday. This is likely due to combination of Seroquel, Remeron, Risperdal and small dose of Ativan that she received with component of insomnia.  We will monitor her closely and have cut back on the dose of Seroquel from 50 to 25 mg  for now  Essential hypertension-resolved as of 09/26/2021 Controlled on dilt  History of stroke Continue atorvastatin 40 mg, aspirin 81 mg.  Anxiety and depression Mirtazapine 15 mg nightly.  Cut back Seroquel from 50 to 25 mg daily 2/2 lethargy  Restless leg syndrome Ropinirole 0.5 mg nightly.  HLD (hyperlipidemia) Continue atorvastatin 40 mg daily.  Protein-calorie malnutrition, severe Mirtazapine 15 mg nightly.  Status post colostomy (HCC)-resolved as of 10/12/2021 Colostomy bag in place. Ostomy care ordered  Tachycardia Restarted Cardizem CD with much better rate control  Insomnia Continue Seroquel at night with a reduced dose of 25 mg to 50  Decubital ulcer Coccyx stage II, present on admission.  See full description below  Pancreatic mass Seen on prior imaging of the abdomen.  Outpatient follow-up with her oncologist.  Keep appointment with Dr. Zenia Resides at [facility] on 11/24/2021.  Tobacco dependence Nicotine patch.  Hard to place TOC involved. May have [facility] at [facility]. May need TB screen so will check quantiferon gold    Subjective: asleep but rouses when I enter, no complaints, alert.  Physical Exam: Vitals:   10/12/21 0829 10/12/21 1602 10/12/21 1943 10/13/21 1119  BP: 116/67 (!) 124/94 (!) 122/90 110/78  Pulse: 100 97 89 (!) 110  Resp: '18 16 16 16  '$ Temp: 97.8 F (36.6 C) 98.5 F (36.9 C) 99 F (37.2 C) 97.8 F (36.6 C)  TempSrc: Axillary     SpO2: 95% 100% 96% 97%  Weight:      Height:       61 year old female sitting in the bed without any difficulties Lungs clear to auscultation bilaterally Cardiovascular regular rate and rhythm Abdomen soft, benign Neuro: alert,  moving all 4 Psychiatry - calm   Family Communication: None  Disposition: Status is: Inpatient Remains inpatient appropriate because: Waiting for placement. Possible d/c 9/5 per [facility]   Planned Discharge Destination: Skilled nursing facility  DVT  prophylaxis-SCDs   Author: Desma Maxim, MD 10/13/2021 2:24 PM  For on call review [email or web address].

## 2021-10-13 NOTE — TOC Progression Note (Signed)
Transition of Care [facility]) - Progression Note    Patient Details  Name: Brittany [name] MRN: [id number] Date of Birth: Oct 26, 1960  [facility] CM/SW Contact  Laurena Slimmer, RN Phone Number: 10/13/2021, 4:00 PM  Clinical Narrative:    Call placed to [place] from [place] [phone]. No answer. Left a message for update to confirm acceptance at  [facility].   Expected Discharge Plan: Long Term Acute Care (LTAC) Barriers to Discharge: Barriers Resolved  Expected Discharge Plan and Services Expected Discharge Plan: Long Term Acute Care (LTAC)   Discharge Planning Services: CM Consult Post Acute Care Choice: [place] arrangements for the past 2 months: Single Family Home Expected Discharge Date: 09/26/21                         HH Arranged: RN, PT, OT, Nurse's Aide [place] Agency: [place] ([place]), [place] Date [place]: 09/26/21   Representative spoke with at [place]: cheryl   Social Determinants of Health (SDOH) Interventions    Readmission Risk Interventions    09/22/2021    1:24 PM 03/06/2021    9:13 AM 02/16/2021    2:45 PM  Readmission Risk Prevention Plan  Transportation Screening Complete Complete Complete  Medication Review Press photographer) Complete Complete Complete  PCP or Specialist appointment within 3-5 days of discharge Complete Complete   HRI or [place] Complete    SW Recovery Care/Counseling Consult Complete  Complete  Palliative Care Screening Not Applicable Not Applicable Not [place] Complete Not Applicable Not Applicable

## 2021-10-14 DIAGNOSIS — Z794 Long term (current) use of insulin: Secondary | ICD-10-CM | POA: Diagnosis not present

## 2021-10-14 DIAGNOSIS — E11649 Type 2 diabetes mellitus with hypoglycemia without coma: Secondary | ICD-10-CM | POA: Diagnosis not present

## 2021-10-14 LAB — GLUCOSE, CAPILLARY
Glucose-Capillary: 329 mg/dL — ABNORMAL HIGH (ref 70–99)
Glucose-Capillary: 367 mg/dL — ABNORMAL HIGH (ref 70–99)
Glucose-Capillary: 394 mg/dL — ABNORMAL HIGH (ref 70–99)
Glucose-Capillary: 142 mg/dL — ABNORMAL HIGH (ref 70–99)
Glucose-Capillary: 139 mg/dL — ABNORMAL HIGH (ref 70–99)
Glucose-Capillary: 294 mg/dL — ABNORMAL HIGH (ref 70–99)
Glucose-Capillary: 307 mg/dL — ABNORMAL HIGH (ref 70–99)
Glucose-Capillary: 292 mg/dL — ABNORMAL HIGH (ref 70–99)

## 2021-10-14 MED ORDER — INSULIN DETEMIR 100 UNIT/ML ~~LOC~~ SOLN
8.0000 [IU] | Freq: Two times a day (BID) | SUBCUTANEOUS | Status: DC
  Administered 2021-10-14: 8 [IU] via SUBCUTANEOUS
  Filled 2021-10-14 (×2): qty 0.08

## 2021-10-14 MED ORDER — TUBERCULIN PPD 5 UNIT/0.1ML ID SOLN
5.0000 [IU] | Freq: Once | INTRADERMAL | Status: AC
  Administered 2021-10-14: 5 [IU] via INTRADERMAL
  Filled 2021-10-14: qty 0.1

## 2021-10-14 NOTE — Progress Notes (Signed)
Progress Note   [name] [id number]:[id number] DOB: 03-Nov-1960 DOA: 09/25/2021     17 DOS: the patient was seen and examined on 10/14/2021   Brief hospital course: 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 Serum sodium is 128, potassium 6.3, chloride of 100, bicarb 20, BUN of 25, serum creatinine of 0.99, GFR greater than 60, nonfasting blood glucose 568, WBC 7.1, hemoglobin 10.8, platelets of 449. Patient had multiple admissions over the last 2 months, glucose was labile with severe hyperglycemia alternating with hypoglycemia.  She also had intermittent hyperkalemia appeared to be correlated with severe hyperglycemia. Insulin dose was adjusted after admission, glucose is better.  Patient is homeless, adult protection agency involved, currently looking for placement. Has a sitter for elopement risk.  8/30: Waiting for placement.  Blood sugar labile 8/31: Blood sugar continues to stay labile.  Was lethargic this morning 9/2: Accepted to LTAC, waiting to hear from DDS. Increased determir.   Assessment and Plan:  * Uncontrolled type 2 diabetes mellitus with hypoglycemia, with long-term current use of insulin (HCC) Blood sugar control remains big challenge.  They remain labile ranging from 80s to 550s.  Patient also has very specific diet request which makes it challenging.  Diabetic nurse following. Glucoses 9/1-9/2 300s.  -increased detemir 6u BID to 8u BID   Acute metabolic encephalopathy, resolved Appears improved since 8/31. This is likely due to combination of Seroquel, Remeron, Risperdal and small dose of Ativan that she received with component of insomnia.  We will monitor her closely and have cut back on the dose of Seroquel from  50 to 25 mg for now. 9/2, continues to [facility] appropriately.    Essential hypertension, resolved as of 09/26/2021 Controlled on dilt   History of stroke Continue atorvastatin 40 mg, aspirin 81 mg.   Anxiety and depression Mirtazapine 15 mg nightly.  Cut back Seroquel from 50 to 25 mg daily 2/2 lethargy   Restless leg syndrome Ropinirole 0.5 mg nightly.   HLD (hyperlipidemia) Continue atorvastatin 40 mg daily.   Protein-calorie malnutrition, severe Mirtazapine 15 mg nightly.   Status post colostomy (HCC)-resolved as of 10/12/2021 Colostomy bag in place. Ostomy care ordered   Tachycardia Restarted Cardizem CD with much better rate control   Insomnia Continue Seroquel at night with a reduced dose of 25 mg to 50   Decubital ulcer Coccyx stage II, present on admission.  See full description below   Pancreatic mass Seen on prior imaging of the abdomen.  Outpatient follow-up with her oncologist.  Keep appointment with Dr. Zenia [name] at [facility] on 11/24/2021.   Tobacco dependence Nicotine patch.   Hard to place TOC involved. Accepted to [facility], waiting on DDS. May need TB screen, unfortunately cannot draw on 9/2 due to long weekend per lab. Placed t-spot PPD.        Subjective: NAOE. Pt feels well, no acute complaints, eating well.   Physical Exam: Vitals:   10/13/21 1119 10/13/21 1933 10/14/21 0227 10/14/21 0641  BP: 110/78 (!) 119/90 100/66 116/88  Pulse: (!) 110 (!) 105 91 89  Resp: '16 16 16 16  '$ Temp: 97.8 F (36.6 C) 98.6 F (37 C) 98.1 F (36.7 C) 98.1 F (36.7 C)  TempSrc:  Oral    SpO2: 97% 99% 96% 97%  Weight:  Height:       Physical Exam Vitals and nursing note reviewed.  Constitutional:      Appearance: Normal appearance. She is not ill-appearing.  HENT:     Head: Normocephalic and atraumatic.     Mouth/Throat:     Mouth: Mucous membranes are moist.  Cardiovascular:     Rate and Rhythm: Normal rate and regular rhythm.  Abdominal:      General: Abdomen is flat. There is no distension.     Palpations: Abdomen is soft. There is no mass.     Tenderness: There is no abdominal tenderness.  Musculoskeletal:     Right lower leg: No edema.     Left lower leg: No edema.  Skin:    General: Skin is warm and dry.     Capillary Refill: Capillary refill takes less than 2 seconds.  Neurological:     Mental Status: She is alert.  Psychiatric:        Mood and Affect: Mood normal.        Behavior: Behavior normal.     Data Reviewed:  Glucose trend [phone] 394  Family Communication: None  Disposition: Status is: Inpatient Remains inpatient appropriate because: placement  Planned Discharge Destination: LTAC    Time spent: 35 minutes  Author: Lorelei Pont, MD 10/14/2021 11:49 AM  For on call review [email or web address].

## 2021-10-15 DIAGNOSIS — Z794 Long term (current) use of insulin: Secondary | ICD-10-CM | POA: Diagnosis not present

## 2021-10-15 DIAGNOSIS — E11649 Type 2 diabetes mellitus with hypoglycemia without coma: Secondary | ICD-10-CM | POA: Diagnosis not present

## 2021-10-15 LAB — GLUCOSE, CAPILLARY
Glucose-Capillary: 261 mg/dL — ABNORMAL HIGH (ref 70–99)
Glucose-Capillary: 257 mg/dL — ABNORMAL HIGH (ref 70–99)
Glucose-Capillary: 189 mg/dL — ABNORMAL HIGH (ref 70–99)
Glucose-Capillary: 472 mg/dL — ABNORMAL HIGH (ref 70–99)
Glucose-Capillary: 479 mg/dL — ABNORMAL HIGH (ref 70–99)
Glucose-Capillary: 339 mg/dL — ABNORMAL HIGH (ref 70–99)

## 2021-10-15 MED ORDER — INSULIN DETEMIR 100 UNIT/ML ~~LOC~~ SOLN
6.0000 [IU] | Freq: Every day | SUBCUTANEOUS | Status: DC
  Administered 2021-10-15: 6 [IU] via SUBCUTANEOUS
  Filled 2021-10-15 (×2): qty 0.06

## 2021-10-15 MED ORDER — INSULIN DETEMIR 100 UNIT/ML ~~LOC~~ SOLN
10.0000 [IU] | Freq: Every day | SUBCUTANEOUS | Status: DC
  Administered 2021-10-15 – 2021-10-17 (×3): 10 [IU] via SUBCUTANEOUS
  Filled 2021-10-15 (×3): qty 0.1

## 2021-10-15 NOTE — Progress Notes (Addendum)
Progress Note   [name] STM:[id number] DOB: Oct 06, 1960 DOA: 09/25/2021     18 DOS: the patient was seen and examined on 10/15/2021   Brief hospital course: 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  Symptoms have resolved and discharge is pending placement.  Glucose has been difficult to control during hospitalization.  Discharge is planned for [facility] on 9/5 at 10:30 AM.  On 9/2, PPD was ordered.  Assessment and Plan:  * Uncontrolled type 2 diabetes mellitus with hypoglycemia, with long-term current use of insulin (HCC) - Glucose remains high when patient consumes snacks throughout the day. - We discussed carb counting and taking insulin based on what she is eating. - For now I will change Levemir to 6 units am and 10 units pm, and continue Aspart 3 units TID, and Ssi with POC glucose ACHS.  *In the afternoon patient ordered extra food and her glucose increased to 479 around 5:50 pm.  If patient will eat extra snacks throughout the day, she will need an extra dose of pre-meal insulin before she eats the snack to prevent these spikes.*   Major Depressive Disorder: Agitation has resolved. - Continue Remeron 15 mg daily and Seroquel 25 mg nightly    Essential hypertension: BP is stable.  HR is 89-94 beats/min. - Continue current regimen.   History of Strokes with mild right sided deficits: - Continue Aspirin and Statin.   Restless leg syndrome - Ropinirole 0.5 mg nightly.   HLD (hyperlipidemia) - Statin.   Protein-calorie malnutrition, severe - Continue Remeron and nutritional supplements.   Status post colostomy-resolved as of 10/12/2021    Decubital ulcer Coccyx stage II, present on admission.   Pancreatic mass Patient has appointment with Oncologist Dr. Zenia Resides at [facility] on 11/24/2021.   Tobacco dependence Nicotine patch.   Hard to place TOC involved. Accepted to [facility], waiting on DDS. ON 9/2 PPD was ordered.  Lab was unable to complete Quantiferon.       Subjective: [name] is sitting comfortably playing cards.  Her mood is pleasant.  We discussed her snacks throughout the day and how this affects her blood sugar.  She voiced understanding.    Physical Exam: Vitals:   10/14/21 0641 10/14/21 1943 10/15/21 0351 10/15/21 0722  BP: 116/88 120/84 (!) 140/98 (!) 144/120  Pulse: 89 97 92 94  Resp: '16 20 19 16  '$ Temp: 98.1 F (36.7 C) (!) 97.5 F (36.4 C) 97.6 F (36.4 C) 98.1 F (36.7 C)  TempSrc:      SpO2: 97% 99% 100% 100%  Weight:      Height:       Physical Exam Vitals and nursing note reviewed.  Constitutional:      Appearance: Normal appearance. She is not ill-appearing.  HENT:     Head: Normocephalic and atraumatic.     Mouth/Throat:     Mouth: Mucous membranes are moist.  Cardiovascular:     Rate and Rhythm: Normal rate and regular rhythm.  Abdominal:     General: Abdomen is flat. There is no distension.     Palpations: Abdomen is soft. There is no mass.  Tenderness: There is no abdominal tenderness.  Musculoskeletal:     Right lower leg: No edema.     Left lower leg: No edema.  Skin:    General: Skin is warm and dry.     Capillary Refill: Capillary refill takes less than 2 seconds.  Neurological:     Mental Status: She is alert.  Psychiatric:        Mood and Affect: Mood normal.        Behavior: Behavior normal.     Data Reviewed:  Lab Results  Component Value Date   WBC 7.9 10/13/2021   HGB 10.5 (L) 10/13/2021   HCT 33.2 (L) 10/13/2021   MCV 92.7 10/13/2021   PLT 463 (H) 17/91/5056   Last metabolic panel Lab Results  Component Value Date   GLUCOSE 33 (LL)  10/12/2021   NA 144 10/12/2021   K 3.8 10/12/2021   CL 114 (H) 10/12/2021   CO2 24 10/12/2021   BUN 27 (H) 10/12/2021   CREATININE 0.60 10/12/2021   GFRNONAA >60 10/12/2021   CALCIUM 9.5 10/12/2021   PHOS 3.8 09/23/2021   PROT 7.7 09/25/2021   ALBUMIN 3.0 (L) 09/25/2021   BILITOT 0.2 (L) 09/30/2021   ALKPHOS 136 (H) 09/25/2021   AST 51 (H) 09/25/2021   ALT 42 09/25/2021   ANIONGAP 6 10/12/2021    Family Communication: None  Disposition: Status is: Inpatient Remains inpatient appropriate because: placement  Planned Discharge Destination: LTAC    Time spent: 35 minutes  Author: George Hugh, MD 10/15/2021 12:20 PM  For on call review [email or web address].

## 2021-10-16 DIAGNOSIS — E11649 Type 2 diabetes mellitus with hypoglycemia without coma: Secondary | ICD-10-CM | POA: Diagnosis not present

## 2021-10-16 DIAGNOSIS — Z794 Long term (current) use of insulin: Secondary | ICD-10-CM | POA: Diagnosis not present

## 2021-10-16 LAB — BASIC METABOLIC PANEL
Anion gap: 7 (ref 5–15)
BUN: 26 mg/dL — ABNORMAL HIGH (ref 8–23)
CO2: 24 mmol/L (ref 22–32)
Calcium: 8.8 mg/dL — ABNORMAL LOW (ref 8.9–10.3)
Chloride: 105 mmol/L (ref 98–111)
Creatinine, Ser: 0.7 mg/dL (ref 0.44–1.00)
GFR, Estimated: >60 mL/min (ref >60)
Glucose, Bld: 199 mg/dL — ABNORMAL HIGH (ref 70–99)
Potassium: 5.1 mmol/L (ref 3.5–5.1)
Sodium: 136 mmol/L (ref 135–145)

## 2021-10-16 LAB — CBC
HCT: 32.4 % — ABNORMAL LOW (ref 36.0–46.0)
Hemoglobin: 10.4 g/dL — ABNORMAL LOW (ref 12.0–15.0)
MCH: 29.2 pg (ref 26.0–34.0)
MCHC: 32.1 g/dL (ref 30.0–36.0)
MCV: 91 fL (ref 80.0–100.0)
Platelets: 443 10*3/uL — ABNORMAL HIGH (ref 150–400)
RBC: 3.56 MIL/uL — ABNORMAL LOW (ref 3.87–5.11)
RDW: 16.6 % — ABNORMAL HIGH (ref 11.5–15.5)
WBC: 8.6 10*3/uL (ref 4.0–10.5)
nRBC: 0.2 % (ref 0.0–0.2)

## 2021-10-16 LAB — GLUCOSE, CAPILLARY
Glucose-Capillary: 286 mg/dL — ABNORMAL HIGH (ref 70–99)
Glucose-Capillary: 164 mg/dL — ABNORMAL HIGH (ref 70–99)
Glucose-Capillary: 240 mg/dL — ABNORMAL HIGH (ref 70–99)

## 2021-10-16 MED ORDER — INSULIN ASPART 100 UNIT/ML IJ SOLN
5.0000 [IU] | Freq: Three times a day (TID) | INTRAMUSCULAR | Status: DC
Start: 2021-10-16 — End: 2021-10-19
  Administered 2021-10-16 – 2021-10-19 (×10): 5 [IU] via SUBCUTANEOUS
  Filled 2021-10-16 (×6): qty 1

## 2021-10-16 MED ORDER — INSULIN DETEMIR 100 UNIT/ML ~~LOC~~ SOLN: 8.0000 [IU] | Freq: Every day | SUBCUTANEOUS | 11 refills | Status: DC

## 2021-10-16 MED ORDER — INSULIN DETEMIR 100 UNIT/ML ~~LOC~~ SOLN
8.0000 [IU] | Freq: Every day | SUBCUTANEOUS | Status: DC
  Administered 2021-10-16 – 2021-10-18 (×3): 8 [IU] via SUBCUTANEOUS
  Filled 2021-10-16 (×3): qty 0.08

## 2021-10-16 MED ORDER — MELATONIN 5 MG PO TABS: 2.5000 mg | ORAL_TABLET | Freq: Every day | ORAL | 0 refills | Status: DC

## 2021-10-16 MED ORDER — NICOTINE 21 MG/24HR TD PT24: 21.0000 mg | MEDICATED_PATCH | Freq: Every day | TRANSDERMAL | 0 refills | Status: DC | PRN

## 2021-10-16 MED ORDER — INSULIN DETEMIR 100 UNIT/ML ~~LOC~~ SOLN: 10.0000 [IU] | Freq: Every day | SUBCUTANEOUS | 11 refills | Status: DC

## 2021-10-16 MED ORDER — QUETIAPINE FUMARATE 25 MG PO TABS: 25.0000 mg | ORAL_TABLET | Freq: Every day | ORAL | Status: DC

## 2021-10-16 MED ORDER — DILTIAZEM HCL ER COATED BEADS 120 MG PO CP24: 120.0000 mg | ORAL_CAPSULE | Freq: Every day | ORAL | Status: DC

## 2021-10-16 MED ORDER — INSULIN ASPART 100 UNIT/ML IJ SOLN: 5.0000 [IU] | Freq: Three times a day (TID) | INTRAMUSCULAR | 11 refills | Status: DC

## 2021-10-16 MED ORDER — ZINC SULFATE 220 (50 ZN) MG PO CAPS: 220.0000 mg | ORAL_CAPSULE | Freq: Every day | ORAL | Status: DC

## 2021-10-16 NOTE — TOC Progression Note (Signed)
Transition of Care [facility]) - Progression Note    Patient Details  Name: Brittany [name] MRN: [id number] Date of Birth: 01-08-1961  Transition of Care [facility]) CM/SW Contact  Laurena Slimmer, RN Phone Number: 10/16/2021, 11:40 AM  Clinical Narrative:    TB test results previously  administered 8/17 and read by Dr. Leslye Peer 8/20. Result was negative. Test administration and results forwarded by email to [name], Executive Director at: [email or web address].    Expected Discharge Plan: Long Term Acute Care (LTAC) Barriers to Discharge: Barriers Resolved  Expected Discharge Plan and Services Expected Discharge Plan: Long Term Acute Care (LTAC)   Discharge Planning Services: CM Consult Post Acute Care Choice: [place] arrangements for the past 2 months: Single Family Home Expected Discharge Date: 10/17/21                         HH Arranged: RN, PT, OT, Nurse's Aide [place] Agency: [place] ([place]), [place] Date [place]: 09/26/21   Representative spoke with at [place]: cheryl   Social Determinants of Health (SDOH) Interventions    Readmission Risk Interventions    09/22/2021    1:24 PM 03/06/2021    9:13 AM 02/16/2021    2:45 PM  Readmission Risk Prevention Plan  Transportation Screening Complete Complete Complete  Medication Review Press photographer) Complete Complete Complete  PCP or Specialist appointment within 3-5 days of discharge Complete Complete   HRI or [place] Complete    SW Recovery Care/Counseling Consult Complete  Complete  Palliative Care Screening Not Applicable Not Applicable Not [place] Complete Not Applicable Not Applicable

## 2021-10-16 NOTE — Care Management Important Message (Signed)
Important Message  Patient Details  Name: Brittany [name] MRN: [id number] Date of Birth: 1961/01/21   Medicare Important Message Given:  Yes     Juliann Pulse A Tesneem Dufrane 10/16/2021, 11:40 AM

## 2021-10-16 NOTE — Inpatient Diabetes Management (Signed)
Inpatient Diabetes Program Recommendations  AACE/ADA: New Consensus Statement on Inpatient Glycemic Control   Target Ranges:  Prepandial:   less than 140 mg/dL      Peak postprandial:   less than 180 mg/dL (1-2 hours)      Critically ill patients:  140 - 180 mg/dL    Latest Reference Range & Units 10/15/21 07:46 10/15/21 11:57 10/15/21 17:43 10/15/21 17:46 10/15/21 20:35 10/16/21 07:26  Glucose-Capillary 70 - 99 mg/dL 257 (H) 189 (H) 479 (H) 472 (H) 339 (H) 286 (H)   Review of Glycemic Control  Diabetes history: DM1 (does NOT make any insulin; requires basal, correction, and carb coverage insulin) Outpatient Diabetes medications: Lantus 8 units daily, Humalog 0-9 units TID with meals and at HS Current orders for Inpatient glycemic control: Levemir 6 units QAM, Levemir 10 units QHS, Novolog 0-9 units TID with meals, Novolog 0-5 units QHS, Novolog 3 units TID with meals   Inpatient Diabetes Program Recommendations:     Insulin: Please consider increasing morning Levemir to 8 units QAM and meal coverage to Novolog 5 units TID with meals if patient eats at least 50% of meals. If patient is eating during the night, she will need insulin to cover carbohydrates she is consumed; may want to order Novolog 0-5 units PRN for snacks during the night (1 unit per 15 grams of carbs).  Thanks, Barnie Alderman, RN, MSN, [place] Diabetes Coordinator Inpatient Diabetes Program [phone] (Team Pager from 8am to [place])

## 2021-10-16 NOTE — Progress Notes (Signed)
112 was entered in error.

## 2021-10-16 NOTE — Discharge Summary (Addendum)
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  Symptoms have resolved and discharge is pending placement.  Glucose has been difficult to control during hospitalization.  Insulin dosing was adjusted.    Discharge planning to  [facility] on 9/7 before 10:30 AM.    Patient is medically stable for discharge.   Following problems were addressed during her hospitalization:  Uncontrolled type 2 diabetes mellitus with hypoglycemia, with long-term current use of insulin - Glucose remains high when patient consumes snacks throughout the day. - Continue current insulin regimen per orders.   - Monitor blood sugars very well.   - Diabetic coordinator  was following.   Major Depressive Disorder: Agitation has resolved. - Continue Remeron 15 mg daily and Seroquel 25 mg nightly    Essential hypertension: BP is stable.  HR is 89-94 beats/min. - Continue current regimen.   History of Strokes with mild right sided deficits: - Continue Aspirin and Statin.   Restless leg syndrome - Ropinirole 0.5 mg nightly.   HLD (hyperlipidemia) - On Statin.   Protein-calorie malnutrition, severe - Continue Remeron and nutritional supplements.   Status post colostomy No acute issues   Decubital ulcer Coccyx stage II, present on admission.   Pancreatic  mass Patient has appointment with Oncologist Dr. Zenia Resides at [facility] on 11/24/2021.   Tobacco dependence Nicotine patch to be continued.   Hard to place TOC involved. Accepted to [facility].       Discharge Diagnoses:  Principal Problem:   Uncontrolled type 2 diabetes mellitus with hypoglycemia, with long-term current use of insulin (HCC) Active Problems:   Acute metabolic encephalopathy   Hyperkalemia   Hyponatremia   Type 2 diabetes mellitus with hyperlipidemia (HCC)   History of stroke   Restless leg syndrome   Seizure disorder (HCC)   Anxiety and depression   HLD (hyperlipidemia)   Protein-calorie malnutrition, severe   History of gastroesophageal reflux (GERD)   Tobacco dependence   Pancreatic mass   Decubital ulcer   Insomnia   Tachycardia    Discharge Instructions  Discharge Instructions     Diet Carb Modified   Complete by: As directed    Discharge instructions   Complete by: As directed    1)Please take your medications as instructed 2)Monitor your blood sugars very closely   Discharge wound care:   Complete by: As directed    Pressure Injury 09/22/21 Coccyx Right;Left;Medial Stage 2 -  Partial thickness loss of dermis presenting as a shallow open injury with a red, pink wound bed without slough. 3 small open areas. 3 days  foam pad dressing   Increase activity slowly   Complete by: As directed    Increase activity slowly   Complete by: As directed    No wound care   Complete by: As directed       Allergies as of 10/18/2021  Reactions   Cephalosporins Itching   TOLERATED ZOSYN (PIPERACILLIN) BEFORE   Hydromorphone Itching   Keflin [cephalothin] Itching   Lactose Intolerance (gi) Diarrhea        Medication List     STOP taking these medications    Lantus SoloStar 100 UNIT/ML Solostar Pen Generic drug: insulin glargine   OLANZapine 7.5 MG tablet Commonly known as: ZYPREXA       TAKE these medications    aspirin EC 81 MG  tablet Take 1 tablet (81 mg total) by mouth daily. Swallow whole.   atorvastatin 40 MG tablet Commonly known as: LIPITOR Take 1 tablet (40 mg total) by mouth Nightly. What changed: when to take this   diltiazem 120 MG 24 hr capsule Commonly known as: CARDIZEM CD Take 1 capsule (120 mg total) by mouth daily.   famotidine 20 MG tablet Commonly known as: PEPCID Take 1 tablet (20 mg total) by mouth daily.   feeding supplement (GLUCERNA SHAKE) Liqd Take 237 mLs by mouth 3 (three) times daily between meals.   insulin aspart 100 UNIT/ML injection Commonly known as: novoLOG Inject 5 Units into the skin 3 (three) times daily with meals. If eats more than 5)% of meals   insulin detemir 100 UNIT/ML injection Commonly known as: LEVEMIR Inject 0.11 mLs (11 Units total) into the skin at bedtime.   insulin detemir 100 UNIT/ML injection Commonly known as: LEVEMIR Inject 0.09 mLs (9 Units total) into the skin daily. Start taking on: October 19, 2021   insulin lispro 100 UNIT/ML injection Commonly known as: HumaLOG 0-9 units TID with meals, and 0-5 units QHS bases on sliding scale previously prescribed to patient.   melatonin 5 MG Tabs Take 0.5 tablets (2.5 mg total) by mouth at bedtime.   mirtazapine 15 MG tablet Commonly known as: REMERON Take 15 mg by mouth at bedtime.   multivitamin with minerals Tabs tablet Take 1 tablet by mouth daily.   nicotine 21 mg/24hr patch Commonly known as: NICODERM CQ - dosed in mg/24 hours Place 1 patch (21 mg total) onto the skin daily as needed (nicotine craving).   QUEtiapine 25 MG tablet Commonly known as: SEROQUEL Take 1 tablet (25 mg total) by mouth at bedtime.   rOPINIRole 0.5 MG tablet Commonly known as: REQUIP Take 0.5 mg by mouth at bedtime.   zinc sulfate 220 (50 Zn) MG capsule Take 1 capsule (220 mg total) by mouth daily.               Discharge Care Instructions  (From admission, onward)           Start      Ordered   09/26/21 0000  Discharge wound care:       Comments: Pressure Injury 09/22/21 Coccyx Right;Left;Medial Stage 2 -  Partial thickness loss of dermis presenting as a shallow open injury with a red, pink wound bed without slough. 3 small open areas. 3 days  foam pad dressing   09/26/21 1139            Follow-up Information     [name], NP. Schedule an appointment as soon as possible for a visit in 1 week(s).   Specialty: Internal Medicine Why: hospital f/u Contact information: [place] [id number] [phone]         Lula Olszewski, MD Follow up.   Specialty: Surgical Oncology Why: Keep appointment on 11/24/2021 Contact information: [place]  [phone]  Allergies  Allergen Reactions   Cephalosporins Itching    TOLERATED ZOSYN (PIPERACILLIN) BEFORE   Hydromorphone Itching   Keflin [Cephalothin] Itching   Lactose Intolerance (Gi) Diarrhea    Consultations: Palliative care   Procedures/Studies: CT HEAD WO CONTRAST (5MM)  Result Date: 09/21/2021 CLINICAL DATA:  Altered mental status EXAM: CT HEAD WITHOUT CONTRAST TECHNIQUE: Contiguous axial images were obtained from the base of the skull through the vertex without intravenous contrast. RADIATION DOSE REDUCTION: This exam was performed according to the departmental dose-optimization program which includes automated exposure control, adjustment of the mA and/or kV according to patient size and/or use of iterative reconstruction technique. COMPARISON:  09/04/2021 FINDINGS: Brain: No acute intracranial findings are seen. There are no signs of bleeding within the cranium. There is no focal edema or mass effect. Cortical sulci are prominent. Old infarcts are seen in right frontal lobe, left temporoparietal region and left cerebellum with no significant interval change. Vascular: Unremarkable. Skull: Unremarkable. Sinuses/Orbits: Visualized  paranasal sinuses are unremarkable. Lacrimal glands in both orbits appear more prominent than usual. Exophthalmos is seen. Other: No significant interval changes are noted. IMPRESSION: No acute intracranial findings are seen. Old infarcts are seen in both cerebral hemispheres and left cerebellum with no significant interval change. Electronically Signed   By: Elmer Picker M.D.   On: 09/21/2021 17:24   DG Chest Port 1 View  Result Date: 09/21/2021 CLINICAL DATA:  Questionable sepsis - evaluate for abnormality EXAM: PORTABLE CHEST 1 VIEW COMPARISON:  Chest x-ray 09/04/2021. FINDINGS: Persistent streaky bibasilar opacities. No visible pleural effusions or pneumothorax. Similar cardiomediastinal silhouette, which is within normal limits. Polyarticular degenerative change. IMPRESSION: Persistent streaky bibasilar opacities, which could represent atelectasis and/or pneumonia. Electronically Signed   By: Margaretha Sheffield M.D.   On: 09/21/2021 16:22      Subjective: Patient seen and examined at the bedside this morning.  Sitter at bedside.  She w,as comfortable lying on bed.  Denies any nausea or vomiting.  Alert oriented  Discharge Exam: Vitals:   10/18/21 0741 10/18/21 1144  BP: 125/85 (!) 134/91  Pulse: (!) 102 (!) 109  Resp: 18 18  Temp: 97.8 F (36.6 C) 98.1 F (36.7 C)  SpO2: 97% 100%   Vitals:   10/17/21 1956 10/18/21 0444 10/18/21 0741 10/18/21 1144  BP: (!) 116/90 108/81 125/85 (!) 134/91  Pulse: 97 (!) 101 (!) 102 (!) 109  Resp: '16 18 18 18  '$ Temp: 98.6 F (37 C) 98.3 F (36.8 C) 97.8 F (36.6 C) 98.1 F (36.7 C)  TempSrc:      SpO2:  98% 97% 100%  Weight:      Height:        General: Pt is alert, awake, not in acute distress Cardiovascular: RRR, S1/S2 +, no rubs, no gallops Respiratory: CTA bilaterally, no wheezing, no rhonchi Abdominal: Soft, NT, ND, bowel sounds + Extremities: no edema, no cyanosis    The results of significant diagnostics from this  hospitalization (including imaging, microbiology, ancillary and laboratory) are listed below for reference.     Microbiology: No results found for this or any previous visit (from the past 240 hour(s)).   Labs: BNP (last 3 results) No results for input(s): "BNP" in the last 8760 hours. Basic Metabolic Panel: Recent Labs  Lab 10/12/21 1216 10/16/21 0621  NA 144 136  K 3.8 5.1  CL 114* 105  CO2 24 24  GLUCOSE 33* 199*  BUN 27* 26*  CREATININE 0.60 0.70  CALCIUM 9.5 8.8*  Liver Function Tests: No results for input(s): "AST", "ALT", "ALKPHOS", "BILITOT", "PROT", "ALBUMIN" in the last 168 hours. No results for input(s): "LIPASE", "AMYLASE" in the last 168 hours. No results for input(s): "AMMONIA" in the last 168 hours. CBC: Recent Labs  Lab 10/12/21 1216 10/13/21 0618 10/16/21 0621  WBC 8.6 7.9 8.6  HGB 10.6* 10.5* 10.4*  HCT 33.6* 33.2* 32.4*  MCV 91.1 92.7 91.0  PLT 491* 463* 443*   Cardiac Enzymes: No results for input(s): "CKTOTAL", "CKMB", "CKMBINDEX", "TROPONINI" in the last 168 hours. BNP: Invalid input(s): "POCBNP" CBG: Recent Labs  Lab 10/17/21 1531 10/17/21 1740 10/17/21 1950 10/18/21 0742 10/18/21 1144  GLUCAP 138* 224* 327* 240* 392*   D-Dimer No results for input(s): "DDIMER" in the last 72 hours. Hgb A1c No results for input(s): "HGBA1C" in the last 72 hours. Lipid Profile No results for input(s): "CHOL", "HDL", "LDLCALC", "TRIG", "CHOLHDL", "LDLDIRECT" in the last 72 hours. Thyroid function studies No results for input(s): "TSH", "T4TOTAL", "T3FREE", "THYROIDAB" in the last 72 hours.  Invalid input(s): "FREET3" Anemia work up No results for input(s): "VITAMINB12", "FOLATE", "FERRITIN", "TIBC", "IRON", "RETICCTPCT" in the last 72 hours. Urinalysis    Component Value Date/Time   COLORURINE STRAW (A) 09/06/2021 0645   APPEARANCEUR CLEAR (A) 09/06/2021 0645   APPEARANCEUR Hazy 04/04/2011 1935   LABSPEC 1.017 09/06/2021 0645   LABSPEC  >1.060 04/04/2011 1935   PHURINE 5.0 09/06/2021 0645   GLUCOSEU >=500 (A) 09/06/2021 0645   GLUCOSEU 50 mg/dL 04/04/2011 1935   HGBUR NEGATIVE 09/06/2021 0645   BILIRUBINUR NEGATIVE 09/06/2021 0645   BILIRUBINUR Negative 04/04/2011 1935   KETONESUR NEGATIVE 09/06/2021 0645   PROTEINUR NEGATIVE 09/06/2021 0645   NITRITE NEGATIVE 09/06/2021 0645   LEUKOCYTESUR NEGATIVE 09/06/2021 0645   LEUKOCYTESUR Negative 04/04/2011 1935   Sepsis Labs Recent Labs  Lab 10/12/21 1216 10/13/21 0618 10/16/21 0621  WBC 8.6 7.9 8.6   Microbiology No results found for this or any previous visit (from the past 240 hour(s)).  Please note: You were cared for by a hospitalist during your hospital stay. Once you are discharged, your primary care physician will handle any further medical issues. Please note that NO REFILLS for any discharge medications will be authorized once you are discharged, as it is imperative that you return to your primary care physician (or establish a relationship with a primary care physician if you do not have one) for your post hospital discharge needs so that they can reassess your need for medications and monitor your lab values.    Time coordinating discharge: 40 minutes  SIGNED:   Ezekiel Slocumb, MD  [facility] 10/18/2021, 3:18 PM Pager [phone]  If 7PM-7AM, please contact night-coverage [email or web address] Password [email or web address]

## 2021-10-17 LAB — GLUCOSE, CAPILLARY
Glucose-Capillary: 138 mg/dL — ABNORMAL HIGH (ref 70–99)
Glucose-Capillary: 151 mg/dL — ABNORMAL HIGH (ref 70–99)
Glucose-Capillary: 191 mg/dL — ABNORMAL HIGH (ref 70–99)
Glucose-Capillary: 208 mg/dL — ABNORMAL HIGH (ref 70–99)
Glucose-Capillary: 224 mg/dL — ABNORMAL HIGH (ref 70–99)
Glucose-Capillary: 327 mg/dL — ABNORMAL HIGH (ref 70–99)

## 2021-10-17 NOTE — TOC Progression Note (Addendum)
Transition of Care [facility]) - Progression Note    Patient Details  Name: Brittany [name] MRN: [id number] Date of Birth: 05-05-1960  Transition [facility] CM/SW Contact  Laurena Slimmer, RN Phone Number: 10/17/2021, 9:43 AM  Clinical Narrative:    9:09am Attempt to reach a representative at [facility] to confirm discharge. No answer. VM full. Unable to leave a message.  9:43am Spoke with Abigail Butts, assigned DSS worker. Advised TB test results were previously completed 8/17 and read 8/20. Abigail Butts also informed information was emailed to [email or web address] yesterday for the executive director's confirmation and approval including an unsuccessful follow up attempt to confirm receipt information was received. Abigail Butts stated she had the directors contact information and would follow up with her.    Expected Discharge Plan: Long Term Acute Care (LTAC) Barriers to Discharge: Barriers Resolved  Expected Discharge Plan and Services Expected Discharge Plan: Long Term Acute Care (LTAC)   Discharge Planning Services: CM Consult Post Acute Care Choice: [place] arrangements for the past 2 months: Single Family Home Expected Discharge Date: 10/17/21                         HH Arranged: RN, PT, OT, Nurse's Aide [place] Agency: [place] ([place]), [place] Date [place]: 09/26/21   Representative spoke with at [place]: cheryl   Social Determinants of Health (SDOH) Interventions    Readmission Risk Interventions    09/22/2021    1:24 PM 03/06/2021    9:13 AM 02/16/2021    2:45 PM  Readmission Risk Prevention Plan  Transportation Screening Complete Complete Complete  Medication Review Press photographer) Complete Complete Complete  PCP or Specialist appointment within 3-5 days of discharge Complete Complete   HRI or [place] Complete    SW Recovery Care/Counseling Consult Complete  Complete  Palliative Care Screening  Not Applicable Not Applicable Not [place] Complete Not Applicable Not Applicable

## 2021-10-17 NOTE — TOC Progression Note (Signed)
Transition of Care [facility]) - Progression Note    Patient Details  Name: Brittany [name] MRN: [id number] Date of Birth: [birth year]  Transition of Care [facility]) CM/SW [place], [place] Phone Number: 10/17/2021, 1:38 PM  Clinical Narrative:     Per Anderson Malta with [facility], patient to go to [facility] Thursday by 10am must be in the building. Anderson Malta confirms that there is nothing on TOC end needed at this time. Currently they are concluding paperwork with DSS in which they anticipate everything to be completed by Thursday. Treatment team updated.    Expected Discharge Plan: Long Term Acute Care (LTAC) Barriers to Discharge: Barriers Resolved  Expected Discharge Plan and Services Expected Discharge Plan: Long Term Acute Care (LTAC)   Discharge Planning Services: CM Consult Post Acute Care Choice: [place] arrangements for the past 2 months: Single Family Home Expected Discharge Date: 10/17/21                         HH Arranged: RN, PT, OT, Nurse's Aide [place] Agency: [place] Date [place]: 09/26/21   Representative spoke with at [place]: cheryl   Social Determinants of Health (SDOH) Interventions    Readmission Risk Interventions    09/22/2021    1:24 PM 03/06/2021    9:13 AM 02/16/2021    2:45 PM  Readmission Risk Prevention Plan  Transportation Screening Complete Complete Complete  Medication Review Press photographer) Complete Complete Complete  PCP or Specialist appointment within 3-5 days of discharge Complete Complete   HRI or [place] Complete    SW Recovery Care/Counseling Consult Complete  Complete  Palliative Care Screening Not Applicable Not Applicable Not [place] Complete Not Applicable Not Applicable

## 2021-10-17 NOTE — Progress Notes (Signed)
Patient seen and examined at the bedside this morning.  Hemodynamically stable, comfortable.  No change changes from yesterday.  No new complaints.  She is medically stable for discharge whenever bed is available at [facility].  No new change in the medical management.  DC summary and orders are already in place.

## 2021-10-17 NOTE — Progress Notes (Signed)
Nutrition Follow-up  DOCUMENTATION CODES:   Severe malnutrition in context of chronic illness  INTERVENTION:   -Continue MVI with minerals daily -Continue double protein portions with meals -Continue 220 mg zinc sulfate daily x 14 days -Continue 500 mg vitamin C BID -Continue carb modified diet -RD to sign off secondary to medical stability; if further nutrition needs arise, please re-consult RD  NUTRITION DIAGNOSIS:   Severe Malnutrition related to chronic illness (pancreatic cancer) as evidenced by moderate fat depletion, severe fat depletion, moderate muscle depletion, severe muscle depletion.  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  Reviewed I/O's: -110 ml x 24 hours and +332 ml since 10/03/21  Colostomy output: 950 ml x 24 hours   Pt remains with good appetite. Noted meal completions 90-100%.  No new wt readings since last week, however, noted wt gain since admission, which is favorable given pt's malnutrition.   Per MD notes, plan to discharge to [facility] on 10/17/21.  Medications reviewed and include vitamin C, cardizem, melatonin, remeron, and zinc sulfate daily.   Labs reviewed: CBGS: 151-286 (inpatient orders for glycemic control are 0-5 units insulin aspart daily at bedtime, 0-9 units insulin aspart TID with meals, 5 units insulin aspart TID with meals, 8 units insulin detemir daily, and 10 units insulin detemir daily at bedtime).    Diet Order:   Diet Order             Diet Carb Modified Fluid consistency: Thin; Room  service appropriate? Yes  Diet effective now           Diet Carb Modified                   EDUCATION NEEDS:   No education needs have been identified at this time  Skin:  Skin Assessment: Skin Integrity Issues: Skin Integrity Issues:: Stage II Stage II: coccyx Stage III: -  Last BM:  10/17/21 (600 ml via colostomy)  Height:   Ht Readings from Last 1 Encounters:  09/25/21 5' (1.524 m)    Weight:   Wt Readings from Last 1 Encounters:  10/11/21 51.8 kg    Ideal Body Weight:  45.5 kg  BMI:  Body mass index is 22.3 kg/m.  Estimated Nutritional Needs:   Kcal:  1700-1900  Protein:  90-105 grams  Fluid:  > 1.7 L    Loistine Chance, RD, LDN, [place] Registered Dietitian II Certified Diabetes Care and Education Specialist Please refer to [facility] for RD and/or RD on-call/weekend/after hours pager

## 2021-10-18 DIAGNOSIS — Z794 Long term (current) use of insulin: Secondary | ICD-10-CM | POA: Diagnosis not present

## 2021-10-18 DIAGNOSIS — E11649 Type 2 diabetes mellitus with hypoglycemia without coma: Secondary | ICD-10-CM | POA: Diagnosis not present

## 2021-10-18 LAB — GLUCOSE, CAPILLARY
Glucose-Capillary: 171 mg/dL — ABNORMAL HIGH (ref 70–99)
Glucose-Capillary: 190 mg/dL — ABNORMAL HIGH (ref 70–99)
Glucose-Capillary: 199 mg/dL — ABNORMAL HIGH (ref 70–99)
Glucose-Capillary: 240 mg/dL — ABNORMAL HIGH (ref 70–99)
Glucose-Capillary: 242 mg/dL — ABNORMAL HIGH (ref 70–99)
Glucose-Capillary: 392 mg/dL — ABNORMAL HIGH (ref 70–99)

## 2021-10-18 MED ORDER — INSULIN DETEMIR 100 UNIT/ML ~~LOC~~ SOLN: 11.0000 [IU] | Freq: Every day | SUBCUTANEOUS | 11 refills | Status: DC

## 2021-10-18 MED ORDER — INSULIN DETEMIR 100 UNIT/ML ~~LOC~~ SOLN: 9.0000 [IU] | Freq: Every day | SUBCUTANEOUS | 11 refills | Status: DC

## 2021-10-18 MED ORDER — INSULIN DETEMIR 100 UNIT/ML ~~LOC~~ SOLN
9.0000 [IU] | Freq: Every day | SUBCUTANEOUS | Status: DC
  Administered 2021-10-19: 9 [IU] via SUBCUTANEOUS
  Filled 2021-10-18: qty 0.09

## 2021-10-18 MED ORDER — INSULIN DETEMIR 100 UNIT/ML ~~LOC~~ SOLN
11.0000 [IU] | Freq: Every day | SUBCUTANEOUS | Status: DC
  Administered 2021-10-18: 11 [IU] via SUBCUTANEOUS
  Filled 2021-10-18: qty 0.11

## 2021-10-18 NOTE — TOC Progression Note (Signed)
Transition of Care [facility]) - Progression Note    Patient Details  Name: Brittany [name] MRN: [id number] Date of Birth: 27-Jun-1960  Transition of Care [facility]) CM/SW Contact  Laurena Slimmer, RN Phone Number: 10/18/2021, 3:19 PM  Clinical Narrative:    2:02pm  Spoke with Devota Pace from DSS to confirm discharge plan for 9/7. Patient will be transported by Abigail Butts to [facility] at 9:30. FL2 completed and emailed to [name], Development worker, international aid at [email or web address]   Expected Discharge Plan: Long Term Acute Care (LTAC) Barriers to Discharge: Barriers Resolved  Expected Discharge Plan and Services Expected Discharge Plan: Long Term Acute Care (LTAC)   Discharge Planning Services: CM Consult Post Acute Care Choice: [place] arrangements for the past 2 months: Single Family Home Expected Discharge Date: 10/17/21                         HH Arranged: RN, PT, OT, Nurse's Aide [place] Agency: [place] Date [place]: 09/26/21   Representative spoke with at [place]: cheryl   Social Determinants of Health (SDOH) Interventions    Readmission Risk Interventions    09/22/2021    1:24 PM 03/06/2021    9:13 AM 02/16/2021    2:45 PM  Readmission Risk Prevention Plan  Transportation Screening Complete Complete Complete  Medication Review 14/06/2021) Complete Complete Complete  PCP or Specialist appointment within 3-5 days of discharge Complete Complete   HRI or [place] Complete    SW Recovery Care/Counseling Consult Complete  Complete  Palliative Care Screening Not Applicable Not Applicable Not [place] Complete Not Applicable Not Applicable
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
## 2021-10-18 NOTE — Inpatient Diabetes Management (Addendum)
Inpatient Diabetes Program Recommendations  AACE/ADA: New Consensus Statement on Inpatient Glycemic Control   Target Ranges:  Prepandial:   less than 140 mg/dL      Peak postprandial:   less than 180 mg/dL (1-2 hours)      Critically ill patients:  140 - 180 mg/dL    Latest Reference Range & Units 10/17/21 08:13 10/17/21 11:29 10/17/21 15:31 10/17/21 17:40 10/17/21 19:50 10/18/21 07:42  Glucose-Capillary 70 - 99 mg/dL 208 (H) 191 (H) 138 (H) 224 (H) 327 (H) 240 (H)   Review of Glycemic Control  Diabetes history: DM1 (does NOT make any insulin; requires basal, correction, and carb coverage insulin) Outpatient Diabetes medications: Lantus 8 units daily, Humalog 0-9 units TID with meals and at HS Current orders for Inpatient glycemic control: Levemir 8 units QAM, Levemir 10 units QHS, Novolog 0-9 units TID with meals, Novolog 0-5 units QHS, Novolog 5 units TID with meals   Inpatient Diabetes Program Recommendations:     Insulin: Please consider increasing morning Levemir to 9 units QAM and bedtime Levemir to 11 units QHS.  Outpatient DM medications: When patient is discharged to SNF, would recommend discharging on same insulin regimen as being using inpatient.  Thanks, Barnie Alderman, RN, MSN, [place] Diabetes Coordinator Inpatient Diabetes Program [phone] (Team Pager from 8am to 5pm)'

## 2021-10-18 NOTE — Progress Notes (Signed)
Progress Note   [name] EHU:[id number] DOB: 11-20-1960 DOA: 09/25/2021     21 DOS: the patient was seen and examined on 10/18/2021   Brief hospital course: 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 Serum sodium is 128, potassium 6.3, chloride of 100, bicarb 20, BUN of 25, serum creatinine of 0.99, GFR greater than 60, nonfasting blood glucose 568, WBC 7.1, hemoglobin 10.8, platelets of 449. Patient had multiple admissions over the last 2 months, glucose was labile with severe hyperglycemia alternating with hypoglycemia.  She also had intermittent hyperkalemia appeared to be correlated with severe hyperglycemia. Insulin dose was adjusted after admission, glucose is better.  Patient is homeless, adult protection agency involved, looking for long-term placement. Has a sitter for elopement risk.  8/30: Waiting for placement.  Blood sugar labile 8/31: Blood sugar continues to stay labile.  Was lethargic this morning 9/2: Accepted to LTC at [facility], waiting to hear from [place].  Increased determir.   9/6: remains stable for discharge, apparently facility cannot admit her until tomorrow.  Increased Levemir further.  Assessment and Plan: Uncontrolled type 2 diabetes mellitus with hypoglycemia, with long-term current use of insulin - Glucose remains high when patient consumes snacks throughout the day. -Continue current insulin regimen.  Levemir was increased.   -Monitor blood sugars very closely.   -Diabetic coordinator  was following.   Major Depressive Disorder: Agitation has resolved. - Continue Remeron 15 mg daily and Seroquel 25 mg nightly    Essential hypertension: BP is stable.  HR is 89-94 beats/min. - Continue  current regimen.   History of Strokes with mild right sided deficits: - Continue Aspirin and Statin.   Restless leg syndrome - Ropinirole 0.5 mg nightly.   HLD (hyperlipidemia) - On Statin.   Protein-calorie malnutrition, severe - Continue Remeron and nutritional supplements.   Status post colostomy   Decubital ulcer Coccyx stage II, present on admission.   Pancreatic mass Patient has appointment with Oncologist Dr. Zenia Resides at [facility] on 11/24/2021.   Tobacco dependence Nicotine patch to be continued.   Hard to place TOC involved. Accepted to [facility].      Subjective: Patient seen awake sitting edge of bed with sitter at bedside today.  She reports "I am ready to go" she denies any acute complaints and no acute events have been reported.  Informed patient of planned discharge to [place] tomorrow and she is eager and agreeable.  Physical Exam: Vitals:   10/17/21 1956 10/18/21 0444 10/18/21 0741 10/18/21 1144  BP: (!) 116/90 108/81 125/85 (!) 134/91  Pulse: 97 (!) 101 (!) 102 (!) 109  Resp: '16 18 18 18  '$ Temp: 98.6 F (37 C) 98.3 F (36.8 C) 97.8 F (36.6 C) 98.1 F (36.7 C)  TempSrc:      SpO2:  98% 97% 100%  Weight:      Height:       General exam: awake, alert, no acute distress HEENT: atraumatic, clear conjunctiva, anicteric sclera, moist mucus membranes, hearing grossly normal  Respiratory system: CTAB, no wheezes, rales or rhonchi, normal respiratory effort. Cardiovascular system: normal S1/S2, RRR, no JVD, murmurs, rubs, gallops, no pedal edema.   Gastrointestinal system: soft, NT, ND, no HSM felt, +bowel sounds. Central nervous system: A&O x3. no gross focal neurologic deficits, normal  speech Extremities: moves all , no edema, normal tone Skin: dry, intact, normal temperature, normal color, no rashes, lesions or ulcers Psychiatry: normal mood, congruent affect, judgement and insight appear normal  Data Reviewed:  Notable labs: CBGs past 24  hours 191, 138, 224, 327, 240, 392  Family Communication: None  Disposition: Status is: Inpatient Remains inpatient appropriate because: Requires long-term placement [facility] unable to take patient for admission until tomorrow.   Planned Discharge Destination:  Long-term care, [facility]    Time spent: 35 minutes  Author: Ezekiel Slocumb, DO 10/18/2021 1:59 PM  For on call review [email or web address].
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
## 2021-10-18 NOTE — Assessment & Plan Note (Signed)
Blood sugar control remains big challenge.  They remain labile ranging from 80s to 550s.  Patient also has very specific diet request which makes it challenging.  Diabetic nurse following.

## 2021-10-19 ENCOUNTER — Encounter: Payer: Self-pay | Admitting: Emergency Medicine

## 2021-10-19 ENCOUNTER — Emergency Department
Admission: EM | Admit: 2021-10-19 | Discharge: 2021-10-19 | Disposition: A | Discharge status: Home / Self Care | Payer: Medicare Other | Attending: Emergency Medicine | Admitting: Emergency Medicine

## 2021-10-19 ENCOUNTER — Other Ambulatory Visit: Payer: Self-pay

## 2021-10-19 DIAGNOSIS — E11649 Type 2 diabetes mellitus with hypoglycemia without coma: Secondary | ICD-10-CM | POA: Diagnosis not present

## 2021-10-19 DIAGNOSIS — Z794 Long term (current) use of insulin: Secondary | ICD-10-CM | POA: Diagnosis not present

## 2021-10-19 DIAGNOSIS — Z433 Encounter for attention to colostomy: Secondary | ICD-10-CM | POA: Diagnosis present

## 2021-10-19 LAB — QUANTIFERON-TB GOLD PLUS (RQFGPL)
QuantiFERON Mitogen Value: >10 IU/mL
QuantiFERON Nil Value: 0.17 IU/mL
QuantiFERON TB1 Ag Value: 0.18 IU/mL
QuantiFERON TB2 Ag Value: 0.18 IU/mL

## 2021-10-19 LAB — QUANTIFERON-TB GOLD PLUS: QuantiFERON-TB Gold Plus: NEGATIVE

## 2021-10-19 LAB — GLUCOSE, CAPILLARY: Glucose-Capillary: 262 mg/dL — ABNORMAL HIGH (ref 70–99)

## 2021-10-19 NOTE — ED Provider Notes (Signed)
   [facility] Provider Note    Event Date/Time   First MD Initiated Contact with Patient 10/19/21 1300     (approximate)   History   No chief complaint on file.   HPI  Brittany [name] is a 61 y.o. female   Past medical history of colostomy in place who presents from her facility after the colostomy bag peeled off, no trauma, no abdominal pain, no other medical complaints.  The facility they are not allowed to remedy the loosened colostomy bag.  History was obtained via EMS, and the patient      Physical Exam   Triage Vital Signs: ED Triage Vitals  Enc Vitals Group     BP      Pulse      Resp      Temp      Temp src      SpO2      Weight      Height      Head Circumference      Peak Flow      Pain Score      Pain Loc      Pain Edu?      Excl. in [place]?     Most recent vital signs: There were no vitals filed for this visit.  General: Awake, no distress.  CV:  Good peripheral perfusion.  Resp:  Normal effort.  Abd:  No distention.  Tender to palpation.  Colostomy bag in place, site appears clean, dry, noninfected.  Bag is peeled off slightly.    ED Results / Procedures / Treatments    PROCEDURES:  Critical Care performed: No  Procedures   MEDICATIONS ORDERED IN ED: Medications - No data to display  IMPRESSION / MDM / [place] / ED COURSE  I reviewed the triage vital signs and the nursing notes.                              Differential diagnosis includes, but is not limited to, ostomy bag problem.  She otherwise looks well, facility gave EMS the supplies to ask the colostomy bag in the place.  Replace and  discharge.   Patient's presentation is most consistent with acute, uncomplicated illness.       FINAL CLINICAL IMPRESSION(S) / ED DIAGNOSES   Final diagnoses:  Colostomy care [facility])     Rx / DC Orders   ED Discharge Orders     None        Note:  This document was prepared using Dragon voice  recognition software and may include unintentional dictation errors.    Lucillie Garfinkel, MD 10/19/21 [phone]

## 2021-10-19 NOTE — ED Triage Notes (Signed)
Pt to ED via [facility] from [facility]. Pt's coloscopy bag busted and she needs it changed, staff at [facility] said they could not change it. Stoma appears within normal limits. Pt is in NAD.

## 2021-10-19 NOTE — Care Management Important Message (Signed)
Important Message  Patient Details  Name: Brittany [name] MRN: [id number] Date of Birth: 12/08/1960   Medicare Important Message Given:  Other (see comment)  Patient is in an isolation room so I called [phone]) to review the Important Message from Medicare with her again but did not receive and answer. Patient is scheduled to be transported to facility this morning so will check back as time allows.   Juliann Pulse A Tomasa Dobransky 10/19/2021, 9:13 AM

## 2021-10-19 NOTE — Progress Notes (Signed)
Stable for discharge. Brittany [name] from [facility] to pick patient up. No PIV present. Escorted to entrance with wheelchair. No further needs.

## 2021-10-19 NOTE — Progress Notes (Signed)
Patient seen and examined at bedside this morning.  She remains medically stable for discharge this morning.  Discharge summary and orders completed previously.  No changes to plan as outlined.

## 2021-10-20 NOTE — TOC Transition Note (Signed)
Transition of Care [facility]) - CM/SW Discharge Note   Patient Details  Name: HAJAR PENNINGER MRN: [id number] Date of Birth: 1960-03-01  Transition of Care [facility]) CM/SW Contact:  Laurena Slimmer, RN Phone Number: 10/20/2021, 4:10 PM   Clinical Narrative:    Message retrieved from facility stating FL2 was not received, and patient had not been administered medications by facility. FL2 resent. Contacted Building services engineer at [facility]. Advised FL2 was previously sent 9/6 and follow up calls were made but not returned to confirm email sent to [email or web address]  had been received. Zigmund Daniel stated FL2 that was sent was not utilized by the facility pharmacist. The facility has their own FL2 that has requires a  physical signature. Zigmund Daniel advised this information had not been communicated  with DSS or TOC prior to patient's discharge. Zigmund Daniel advised signature was up to the attending's discretion since patient had discharge from [facility]. Zigmund Daniel stated she would check with the Director to see if form would be acceptable if not patient may have to discharge back to hospital.    Final next level of care: Long Term Acute Care (LTAC) Barriers to Discharge: Barriers Resolved   Patient Goals and CMS Choice        Discharge Placement                       Discharge Plan and Services   Discharge Planning Services: CM Consult Post Acute Care Choice: Home Health                    HH Arranged: RN, PT, OT, Nurse's Aide [place] Agency: [place] [facility] [place] Date [facility] Agency Contacted: 09/26/21   Representative spoke with at [place]: cheryl  Social Determinants of Health (SDOH) Interventions     Readmission Risk Interventions    09/22/2021    1:24 PM 03/06/2021    9:13 AM 02/16/2021    2:45 PM  Readmission Risk Prevention Plan  Transportation Screening Complete Complete Complete  Medication Review Press photographer) Complete Complete Complete  PCP or Specialist appointment  within 3-5 days of discharge Complete Complete   HRI or Home Care Consult Complete    SW Recovery Care/Counseling Consult Complete  Complete  Palliative Care Screening Not Applicable Not Applicable Not [place] Complete Not Applicable Not Applicable

## 2021-10-21 ENCOUNTER — Other Ambulatory Visit: Payer: Self-pay

## 2021-10-21 ENCOUNTER — Observation Stay
Admission: EM | Admit: 2021-10-21 | Discharge: 2021-10-23 | Disposition: A | Discharge status: Skilled Nursing Facility | Payer: Medicare Other | Attending: Internal Medicine | Admitting: Internal Medicine

## 2021-10-21 DIAGNOSIS — Z79899 Other long term (current) drug therapy: Secondary | ICD-10-CM | POA: Insufficient documentation

## 2021-10-21 DIAGNOSIS — L89152 Pressure ulcer of sacral region, stage 2: Secondary | ICD-10-CM | POA: Insufficient documentation

## 2021-10-21 DIAGNOSIS — E43 Unspecified severe protein-calorie malnutrition: Secondary | ICD-10-CM | POA: Diagnosis not present

## 2021-10-21 DIAGNOSIS — Z7982 Long term (current) use of aspirin: Secondary | ICD-10-CM | POA: Diagnosis not present

## 2021-10-21 DIAGNOSIS — E440 Moderate protein-calorie malnutrition: Secondary | ICD-10-CM | POA: Diagnosis present

## 2021-10-21 DIAGNOSIS — Z8507 Personal history of malignant neoplasm of pancreas: Secondary | ICD-10-CM | POA: Diagnosis not present

## 2021-10-21 DIAGNOSIS — F17210 Nicotine dependence, cigarettes, uncomplicated: Secondary | ICD-10-CM | POA: Diagnosis not present

## 2021-10-21 DIAGNOSIS — J449 Chronic obstructive pulmonary disease, unspecified: Secondary | ICD-10-CM | POA: Diagnosis not present

## 2021-10-21 DIAGNOSIS — F418 Other specified anxiety disorders: Secondary | ICD-10-CM | POA: Diagnosis present

## 2021-10-21 DIAGNOSIS — G2581 Restless legs syndrome: Secondary | ICD-10-CM | POA: Diagnosis present

## 2021-10-21 DIAGNOSIS — D490 Neoplasm of unspecified behavior of digestive system: Secondary | ICD-10-CM | POA: Diagnosis present

## 2021-10-21 DIAGNOSIS — I10 Essential (primary) hypertension: Secondary | ICD-10-CM | POA: Diagnosis not present

## 2021-10-21 DIAGNOSIS — L930 Discoid lupus erythematosus: Secondary | ICD-10-CM | POA: Diagnosis present

## 2021-10-21 DIAGNOSIS — E875 Hyperkalemia: Secondary | ICD-10-CM | POA: Insufficient documentation

## 2021-10-21 DIAGNOSIS — Z933 Colostomy status: Secondary | ICD-10-CM

## 2021-10-21 DIAGNOSIS — E11649 Type 2 diabetes mellitus with hypoglycemia without coma: Secondary | ICD-10-CM | POA: Diagnosis not present

## 2021-10-21 DIAGNOSIS — N179 Acute kidney failure, unspecified: Secondary | ICD-10-CM

## 2021-10-21 DIAGNOSIS — L8990 Pressure ulcer of unspecified site, unspecified stage: Secondary | ICD-10-CM | POA: Diagnosis present

## 2021-10-21 DIAGNOSIS — Z8673 Personal history of transient ischemic attack (TIA), and cerebral infarction without residual deficits: Secondary | ICD-10-CM | POA: Insufficient documentation

## 2021-10-21 DIAGNOSIS — Z794 Long term (current) use of insulin: Secondary | ICD-10-CM | POA: Diagnosis not present

## 2021-10-21 DIAGNOSIS — E1165 Type 2 diabetes mellitus with hyperglycemia: Principal | ICD-10-CM | POA: Diagnosis present

## 2021-10-21 DIAGNOSIS — R739 Hyperglycemia, unspecified: Secondary | ICD-10-CM

## 2021-10-21 LAB — CBG MONITORING, ED
Glucose-Capillary: 555 mg/dL (ref 70–99)
Glucose-Capillary: 279 mg/dL — ABNORMAL HIGH (ref 70–99)
Glucose-Capillary: 415 mg/dL — ABNORMAL HIGH (ref 70–99)

## 2021-10-21 LAB — CBC
HCT: 34.5 % — ABNORMAL LOW (ref 36.0–46.0)
Hemoglobin: 10.5 g/dL — ABNORMAL LOW (ref 12.0–15.0)
MCH: 28.9 pg (ref 26.0–34.0)
MCHC: 30.4 g/dL (ref 30.0–36.0)
MCV: 95 fL (ref 80.0–100.0)
Platelets: 352 10*3/uL (ref 150–400)
RBC: 3.63 MIL/uL — ABNORMAL LOW (ref 3.87–5.11)
RDW: 16.9 % — ABNORMAL HIGH (ref 11.5–15.5)
WBC: 6.3 10*3/uL (ref 4.0–10.5)
nRBC: 0 % (ref 0.0–0.2)

## 2021-10-21 LAB — URINALYSIS, ROUTINE W REFLEX MICROSCOPIC
Bacteria, UA: NONE SEEN
Bilirubin Urine: NEGATIVE
Glucose, UA: >=500 mg/dL — AB
Hgb urine dipstick: NEGATIVE
Ketones, ur: NEGATIVE mg/dL
Leukocytes,Ua: NEGATIVE
Nitrite: NEGATIVE
Protein, ur: NEGATIVE mg/dL
Specific Gravity, Urine: 1.022 (ref 1.005–1.030)
Squamous Epithelial / HPF: NONE SEEN (ref 0–5)
pH: 5 (ref 5.0–8.0)

## 2021-10-21 LAB — BASIC METABOLIC PANEL
Anion gap: 10 (ref 5–15)
BUN: 36 mg/dL — ABNORMAL HIGH (ref 8–23)
CO2: 20 mmol/L — ABNORMAL LOW (ref 22–32)
Calcium: 9 mg/dL (ref 8.9–10.3)
Chloride: 100 mmol/L (ref 98–111)
Creatinine, Ser: 1.08 mg/dL — ABNORMAL HIGH (ref 0.44–1.00)
GFR, Estimated: 58 mL/min — ABNORMAL LOW (ref >60)
Glucose, Bld: 581 mg/dL (ref 70–99)
Potassium: 6 mmol/L — ABNORMAL HIGH (ref 3.5–5.1)
Sodium: 130 mmol/L — ABNORMAL LOW (ref 135–145)

## 2021-10-21 MED ORDER — MIRTAZAPINE 15 MG PO TABS
15.0000 mg | ORAL_TABLET | Freq: Every day | ORAL | Status: DC
  Administered 2021-10-21 – 2021-10-22 (×2): 15 mg via ORAL
  Filled 2021-10-21 (×2): qty 1

## 2021-10-21 MED ORDER — INSULIN ASPART 100 UNIT/ML IJ SOLN
15.0000 [IU] | Freq: Once | INTRAMUSCULAR | Status: AC
  Administered 2021-10-21: 15 [IU] via INTRAVENOUS
  Filled 2021-10-21: qty 1

## 2021-10-21 MED ORDER — ROPINIROLE HCL 1 MG PO TABS
0.5000 mg | ORAL_TABLET | Freq: Every day | ORAL | Status: DC
  Administered 2021-10-21 – 2021-10-22 (×2): 0.5 mg via ORAL
  Filled 2021-10-21: qty 2
  Filled 2021-10-21: qty 1

## 2021-10-21 MED ORDER — SODIUM CHLORIDE 0.9 % IV BOLUS
1000.0000 mL | Freq: Once | INTRAVENOUS | Status: AC
  Administered 2021-10-21: 1000 mL via INTRAVENOUS

## 2021-10-21 MED ORDER — ACETAMINOPHEN 650 MG RE SUPP: 650.0000 mg | Freq: Four times a day (QID) | RECTAL | Status: DC | PRN

## 2021-10-21 MED ORDER — DILTIAZEM HCL ER COATED BEADS 120 MG PO CP24
120.0000 mg | ORAL_CAPSULE | Freq: Every day | ORAL | Status: DC
  Administered 2021-10-21 – 2021-10-23 (×3): 120 mg via ORAL
  Filled 2021-10-21 (×3): qty 1

## 2021-10-21 MED ORDER — MELATONIN 5 MG PO TABS
2.5000 mg | ORAL_TABLET | Freq: Every day | ORAL | Status: DC
  Administered 2021-10-21 – 2021-10-22 (×2): 2.5 mg via ORAL
  Filled 2021-10-21 (×2): qty 1

## 2021-10-21 MED ORDER — NICOTINE 21 MG/24HR TD PT24: 21.0000 mg | MEDICATED_PATCH | Freq: Every day | TRANSDERMAL | Status: DC | PRN

## 2021-10-21 MED ORDER — ONDANSETRON HCL 4 MG PO TABS: 4.0000 mg | ORAL_TABLET | Freq: Four times a day (QID) | ORAL | Status: DC | PRN

## 2021-10-21 MED ORDER — ENOXAPARIN SODIUM 40 MG/0.4ML IJ SOSY
40.0000 mg | PREFILLED_SYRINGE | INTRAMUSCULAR | Status: DC
  Administered 2021-10-21 – 2021-10-22 (×2): 40 mg via SUBCUTANEOUS
  Filled 2021-10-21 (×2): qty 0.4

## 2021-10-21 MED ORDER — INSULIN DETEMIR 100 UNIT/ML ~~LOC~~ SOLN
11.0000 [IU] | Freq: Every day | SUBCUTANEOUS | Status: DC
Start: 2021-10-21 — End: 2021-10-21

## 2021-10-21 MED ORDER — INSULIN ASPART 100 UNIT/ML IJ SOLN: 6.0000 [IU] | Freq: Once | INTRAMUSCULAR | Status: DC

## 2021-10-21 MED ORDER — QUETIAPINE FUMARATE 25 MG PO TABS
25.0000 mg | ORAL_TABLET | Freq: Every day | ORAL | Status: DC
  Administered 2021-10-21 – 2021-10-22 (×2): 25 mg via ORAL
  Filled 2021-10-21 (×2): qty 1

## 2021-10-21 MED ORDER — INSULIN ASPART 100 UNIT/ML IJ SOLN
0.0000 [IU] | Freq: Three times a day (TID) | INTRAMUSCULAR | Status: DC
  Administered 2021-10-22: 1 [IU] via SUBCUTANEOUS
  Filled 2021-10-21: qty 1

## 2021-10-21 MED ORDER — FAMOTIDINE 20 MG PO TABS
20.0000 mg | ORAL_TABLET | Freq: Every day | ORAL | Status: DC
  Administered 2021-10-21 – 2021-10-23 (×3): 20 mg via ORAL
  Filled 2021-10-21 (×3): qty 1

## 2021-10-21 MED ORDER — GLUCERNA SHAKE PO LIQD
237.0000 mL | Freq: Three times a day (TID) | ORAL | Status: DC
Start: 2021-10-21 — End: 2021-10-23
  Administered 2021-10-22 (×2): 237 mL via ORAL

## 2021-10-21 MED ORDER — ASPIRIN 81 MG PO TBEC
81.0000 mg | DELAYED_RELEASE_TABLET | Freq: Every day | ORAL | Status: DC
  Administered 2021-10-22 – 2021-10-23 (×2): 81 mg via ORAL
  Filled 2021-10-21 (×2): qty 1

## 2021-10-21 MED ORDER — ADULT MULTIVITAMIN W/MINERALS CH
1.0000 | ORAL_TABLET | Freq: Every day | ORAL | Status: DC
  Administered 2021-10-22 – 2021-10-23 (×2): 1 via ORAL
  Filled 2021-10-21 (×2): qty 1

## 2021-10-21 MED ORDER — ACETAMINOPHEN 325 MG PO TABS
650.0000 mg | ORAL_TABLET | Freq: Four times a day (QID) | ORAL | Status: DC | PRN
  Administered 2021-10-22: 650 mg via ORAL
  Filled 2021-10-21: qty 2

## 2021-10-21 MED ORDER — ATORVASTATIN CALCIUM 20 MG PO TABS
40.0000 mg | ORAL_TABLET | Freq: Every day | ORAL | Status: DC
  Administered 2021-10-21 – 2021-10-22 (×2): 40 mg via ORAL
  Filled 2021-10-21 (×2): qty 2

## 2021-10-21 MED ORDER — INSULIN DETEMIR 100 UNIT/ML ~~LOC~~ SOLN
9.0000 [IU] | Freq: Every day | SUBCUTANEOUS | Status: DC
  Administered 2021-10-21: 9 [IU] via SUBCUTANEOUS
  Filled 2021-10-21: qty 0.09

## 2021-10-21 MED ORDER — SODIUM CHLORIDE 0.9 % IV SOLN: INTRAVENOUS | Status: DC

## 2021-10-21 MED ORDER — ZINC SULFATE 220 (50 ZN) MG PO CAPS
220.0000 mg | ORAL_CAPSULE | Freq: Every day | ORAL | Status: DC
  Administered 2021-10-22 – 2021-10-23 (×2): 220 mg via ORAL
  Filled 2021-10-21 (×2): qty 1

## 2021-10-21 MED ORDER — INSULIN ASPART 100 UNIT/ML IJ SOLN
0.0000 [IU] | Freq: Every day | INTRAMUSCULAR | Status: DC
  Administered 2021-10-21: 6 [IU] via SUBCUTANEOUS
  Administered 2021-10-22: 2 [IU] via SUBCUTANEOUS
  Filled 2021-10-21 (×2): qty 1

## 2021-10-21 MED ORDER — ONDANSETRON HCL 4 MG/2ML IJ SOLN: 4.0000 mg | Freq: Four times a day (QID) | INTRAMUSCULAR | Status: DC | PRN

## 2021-10-21 NOTE — Assessment & Plan Note (Signed)
Continue Remeron 15 mg daily and Seroquel 25 mg nightly

## 2021-10-21 NOTE — ED Notes (Signed)
Per floor rn, not able to accept pt at this time due to need of sitter, per floor rn waiting on nursing supervisor and they will communicate with ed staff regarding sitter arrival or bed change.

## 2021-10-21 NOTE — ED Notes (Signed)
This nurse was told by charge nurse that per [facility] pt can come back to facility, she just has to have a prescriptions for all her meds. Provider will be notified so that prescriptions can be be written

## 2021-10-21 NOTE — ED Provider Notes (Signed)
[facility] Provider Note   Event Date/Time   First MD Initiated Contact with Patient 10/21/21 1403     (approximate) History  Hyperglycemia  HPI Brittany [name] is a 61 y.o. female with a past medical history of poorly controlled type 2 diabetes, colostomy, and pancreatic mass who presents for hyperglycemia and altered mental status.  Patient unable to answer questions secondary to mental status however per staff at [facility], patient has not been able to take her medications secondary to altered mental status and that she has been hallucinating and talking to people who were not there.  A glucometer reading this morning read "high" and EMS was called.    Physical Exam  Triage Vital Signs: ED Triage Vitals  Enc Vitals Group     BP 10/21/21 1404 (!) 139/102     Pulse Rate 10/21/21 1404 89     Resp 10/21/21 1404 16     Temp 10/21/21 1404 97.8 F (36.6 C)     Temp Source 10/21/21 1404 Oral     SpO2 10/21/21 1404 100 %     Weight 10/21/21 1403 117 lb (53.1 kg)     Height 10/21/21 1403 5' (1.524 m)     Head Circumference --      Peak Flow --      Pain Score 10/21/21 1403 0     Pain Loc --      Pain Edu? --      Excl. in [place]? --    Most recent vital signs: Vitals:   10/22/21 0413 10/22/21 0810  BP: 114/77 122/86  Pulse: 96 90  Resp: 16 18  Temp: 97.6 F (36.4 C) (!) 97.3 F (36.3 C)  SpO2: 98% 99%   General: Awake, oriented to self CV:  Good peripheral perfusion.  Resp:  Normal effort.  Abd:  No distention.  Other:  Middle-aged African-American female laying in bed in no acute distress ED Results / Procedures / Treatments  Labs (all labs ordered are listed, but only abnormal results are displayed) Labs Reviewed  BASIC METABOLIC PANEL - Abnormal; Notable for the following components:      Result Value   Sodium 130 (*)    Potassium 6.0 (*)    CO2 20 (*)    Glucose, Bld 581 (*)    BUN 36 (*)    Creatinine, Ser 1.08 (*)    GFR, Estimated  58 (*)    All other components within normal limits  CBC - Abnormal; Notable for the following components:   RBC 3.63 (*)    Hemoglobin 10.5 (*)    HCT 34.5 (*)    RDW 16.9 (*)    All other components within normal limits  URINALYSIS, ROUTINE W REFLEX MICROSCOPIC - Abnormal; Notable for the following components:   Color, Urine STRAW (*)    APPearance CLEAR (*)    Glucose, UA >=500 (*)    All other components within normal limits  BLOOD GAS, VENOUS - Abnormal; Notable for the following components:   Acid-base deficit 5.5 (*)    All other components within normal limits  BASIC METABOLIC PANEL - Abnormal; Notable for the following components:   CO2 19 (*)    Glucose, Bld 476 (*)    Calcium 8.3 (*)    All other components within normal limits  CBC - Abnormal; Notable for the following components:   RBC 3.65 (*)    Hemoglobin 10.6 (*)    HCT 34.5 (*)  RDW 16.7 (*)    Platelets 436 (*)    All other components within normal limits  GLUCOSE, CAPILLARY - Abnormal; Notable for the following components:   Glucose-Capillary 410 (*)    All other components within normal limits  GLUCOSE, CAPILLARY - Abnormal; Notable for the following components:   Glucose-Capillary 63 (*)    All other components within normal limits  CBG MONITORING, ED - Abnormal; Notable for the following components:   Glucose-Capillary 555 (*)    All other components within normal limits  CBG MONITORING, ED - Abnormal; Notable for the following components:   Glucose-Capillary 279 (*)    All other components within normal limits  CBG MONITORING, ED - Abnormal; Notable for the following components:   Glucose-Capillary 415 (*)    All other components within normal limits   EKG ED ECG REPORT I, Naaman Plummer, the attending physician, personally viewed and interpreted this ECG. Date: 10/21/2021 EKG Time: 1541 Rate: 91 Rhythm: normal sinus rhythm QRS Axis: normal Intervals: normal ST/T Wave abnormalities:  normal Narrative Interpretation: no evidence of acute ischemia PROCEDURES: Critical Care performed: No .1-3 Lead EKG Interpretation  Performed by: Naaman Plummer, MD Authorized by: Naaman Plummer, MD     Interpretation: normal     ECG rate:  92   ECG rate assessment: normal     Rhythm: sinus rhythm     Ectopy: none     Conduction: normal    MEDICATIONS ORDERED IN ED: Medications  aspirin EC tablet 81 mg (has no administration in time range)  atorvastatin (LIPITOR) tablet 40 mg (40 mg Oral Given 10/21/21 2259)  diltiazem (CARDIZEM CD) 24 hr capsule 120 mg (120 mg Oral Given 10/21/21 2258)  mirtazapine (REMERON) tablet 15 mg (15 mg Oral Given 10/21/21 2259)  nicotine (NICODERM CQ - dosed in mg/24 hours) patch 21 mg (has no administration in time range)  QUEtiapine (SEROQUEL) tablet 25 mg (25 mg Oral Given 10/21/21 2259)  insulin detemir (LEVEMIR) injection 9 Units (9 Units Subcutaneous Given 10/21/21 2301)  famotidine (PEPCID) tablet 20 mg (20 mg Oral Given 10/21/21 2300)  melatonin tablet 2.5 mg (2.5 mg Oral Given 10/21/21 2258)  rOPINIRole (REQUIP) tablet 0.5 mg (0.5 mg Oral Given 10/21/21 2300)  feeding supplement (GLUCERNA SHAKE) (GLUCERNA SHAKE) liquid 237 mL (237 mLs Oral Not Given 10/22/21 0016)  multivitamin with minerals tablet 1 tablet (has no administration in time range)  zinc sulfate capsule 220 mg (has no administration in time range)  enoxaparin (LOVENOX) injection 40 mg (40 mg Subcutaneous Given 10/21/21 2301)  acetaminophen (TYLENOL) tablet 650 mg (650 mg Oral Given 10/22/21 0035)    Or  acetaminophen (TYLENOL) suppository 650 mg ( Rectal See Alternative 10/22/21 0035)  ondansetron (ZOFRAN) tablet 4 mg (has no administration in time range)    Or  ondansetron (ZOFRAN) injection 4 mg (has no administration in time range)  insulin aspart (novoLOG) injection 0-9 Units (has no administration in time range)  insulin aspart (novoLOG) injection 0-5 Units (6 Units Subcutaneous Given 10/21/21  2312)  0.9 %  sodium chloride infusion ( Intravenous New Bag/Given 10/22/21 0042)  insulin aspart (novoLOG) injection 6 Units (6 Units Subcutaneous Not Given 10/22/21 0019)  sodium chloride 0.9 % bolus 1,000 mL (0 mLs Intravenous Stopped 10/21/21 1856)  insulin aspart (novoLOG) injection 15 Units (15 Units Intravenous Given 10/21/21 1518)  sodium chloride 0.9 % bolus 1,000 mL (1,000 mLs Intravenous New Bag/Given 10/21/21 1723)  insulin aspart (novoLOG) injection 9 Units (9  Units Subcutaneous Given 10/22/21 0452)   IMPRESSION / MDM / ASSESSMENT AND PLAN / ED COURSE  I reviewed the triage vital signs and the nursing notes.                             Differential diagnosis includes, but is not limited to, urinary tract infection, DKA, HHS The patient is on the cardiac monitor to evaluate for evidence of arrhythmia and/or significant heart rate changes. Patient's presentation is most consistent with acute presentation with potential threat to life or bodily function. Patient is a 61 year old female who presents for altered mental status and hyperglycemia from her long-term care facility.  Patient's family at bedside and states that she is at her baseline at this time.  Laboratory evaluation shows evidence of hyperglycemia with pseudohyponatremia as well as hyperkalemia associated with this hyperglycemia however patient does not show any signs of acidosis and anion gap is normal.  Patient is still pending urinalysis for further disposition.  Care of this patient will be signed out to the oncoming physician at the end of my shift.  All pertinent patient information conveyed and all questions answered.  All further care and disposition decisions will be made by the oncoming physician.   FINAL CLINICAL IMPRESSION(S) / ED DIAGNOSES   Final diagnoses:  Hyperglycemia   Rx / DC Orders   ED Discharge Orders     None      Note:  This document was prepared using Dragon voice recognition software and may  include unintentional dictation errors.   Naaman Plummer, MD 10/22/21 [phone]

## 2021-10-21 NOTE — Assessment & Plan Note (Signed)
Will repeat BMP now that blood glucose has improved with hydration Has had problems recurrent hyperkalemia during recent hospitalization, correlating with hyperglycemia Serial BMP

## 2021-10-21 NOTE — Assessment & Plan Note (Addendum)
S/p Distal pancreatectomy and splenectomy in 12/2003 Patient has appointment with Oncologist Dr. Zenia Resides at [facility] on 11/24/2021. Continue Pancrease

## 2021-10-21 NOTE — Assessment & Plan Note (Addendum)
Patient states she has not had her insulin since she arrived at the nursing home on 9/7 All local pharmacies currently closed Blood sugar over 500 on arrival improving with subcutaneous insulin, 1 episode of hypoglycemia as patient has labile diabetes. -Switch Levemir to 5 units twice daily -Continue with SSI and mealtime coverage -Continue to monitor

## 2021-10-21 NOTE — ED Notes (Signed)
Pt asking for a new colostomy bag, ice cream, food, and a TV remote. Pt states bag is leaking. Bag is not leaking at this time. Told pt will look for remote and cannot give ice cream due to high blood glucose.

## 2021-10-21 NOTE — ED Notes (Signed)
Pt still demanding that ER provide several colostomy bags from hospital, stating they have not given her insulin at [facility] even though she was eating meals, that she doesn't know how she will get her medicine, more colostomy bags or when will see doctor/PCP. Told pt EDP will talk to her about discharge. Provided phone per request.

## 2021-10-21 NOTE — Discharge Instructions (Signed)
Please encourage p.o. food and fluids.  Do fingersticks 4 times a day and adjust the patient's insulin as needed to cover the fingersticks.  Please have the patient follow-up with her doctor in the next couple days.  Return to the emergency department if the sugars go over 400 or if there are any other problems.

## 2021-10-21 NOTE — ED Notes (Signed)
Eating sandwich tray, more crackers and PB and gave more water.

## 2021-10-21 NOTE — ED Triage Notes (Signed)
Pt to ED [facility] from [facility]  Staff at [facility] state have not been ablek to get pt meds and that BG is high  [facility] staff told EMS that pt has been hallucinating and talking to people who are not there  EMS staff states pt is A&O, CBG was "HI" on EMS glucometer. Pt denies NVD and abdominal pain  Pt is wearing blue scrubs and has LLQ colostomy.  Pt speaking tangentially, stating "I cannot believe they told him that".

## 2021-10-21 NOTE — ED Notes (Signed)
Per nursing supervisor pt may go to floor at 11pm, sitter will arrive at 29.

## 2021-10-21 NOTE — Assessment & Plan Note (Signed)
creatinine 1.08, up from 0.6, Suspect secondary to hyperglycemia Expecting correction with IV hydration and correction of hyperglycemia Continue to monitor

## 2021-10-21 NOTE — ED Notes (Signed)
Family member at bedside stating that pt should not go back to SNF because they did not have insulin for her and stating that ED should have colostomy supplies (new bags). Helped pt empty colostomy bag, 122m liquid stool.

## 2021-10-21 NOTE — ED Provider Notes (Signed)
Patient's urine comes back clear.  Patient's CBG is now down to 70+.  We will discharge the patient as planned as her family member has said she is at her baseline.   Nena Polio, MD 10/21/21 [phone]

## 2021-10-21 NOTE — ED Notes (Signed)
CBG 279. Started 2nd liter bolus. Gave crackers and PB. Pt requesting food from cafeteria.

## 2021-10-21 NOTE — Assessment & Plan Note (Addendum)
On immunosuppressive medication  previously on CellCept.

## 2021-10-21 NOTE — ED Notes (Signed)
This nurse called the [facility] and was told that they did not know what type or size ostomy bag pt used. The also said pt had only got to the facility on Thursday and her meds had not been delivered. The nurse said they were not able to take pt back until Monday due to not having medications. Charge nurse was told this and she said she would have to call the facility back to find out what was going on.

## 2021-10-21 NOTE — ED Notes (Signed)
EDP was at bedside talking with pt.

## 2021-10-21 NOTE — Assessment & Plan Note (Addendum)
Colostomy care 

## 2021-10-21 NOTE — ED Notes (Signed)
IVF still infusing. Pt has purewick, no urine output yet.

## 2021-10-21 NOTE — ED Provider Notes (Signed)
Patient reports she does not want to go back to [facility] because they do not have any of her medicines.  When we called [facility] to check on this it turns out that they do not have any of her medicines.  They said they could take her back if we get her medicines for her or her prescriptions for her so I will call in her prescriptions.  I do not know how they are going to fill her prescriptions tonight however she is supposed to be getting insulin etc.   Nena Polio, MD 10/21/21 1930

## 2021-10-21 NOTE — H&P (Signed)
History and Physical    [name] JYN:[id number] DOB: 13-Jun-1960 DOA: 10/21/2021 DOS: the patient was seen and examined on 10/21/2021 PCP: [name], NP  Patient coming from: SNF  Chief Complaint:  Chief Complaint  Patient presents with   Hyperglycemia    HPI: Brittany [name] is a 61 y.o. 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 followed by oncology at [facility], on pancrease, discoid lupus , RLS, depression, housing insecurity, , COPD, history of diverticulitis with abscess leading to colostomy in January 2023, insulin-dependent diabetes with multiple recent admissions for labile blood sugars with severe hyperglycemia alternating with hypoglycemia as well as hyperkalemia, protein calorie malnutrition secondary to chronic illness and history of stroke, with most recent hospitalization, 8/14 to 9/7 for nausea and vomiting with severe malnutrition, discharged to [facility] [place], who presents to the ED because of high blood sugars.  Since arrival at the facility on 9/7, patient has not received any of her medications.  She was seen in the ED within hours of her discharge on 9/7 because of a dislodged colostomy bag which was replaced and she was discharged back to the facility.  Patient denies any complaints. ED course and data review: BP 139/102 with otherwise normal vitals.  Labs significant for glucose 581 with potassium of 6, bicarb 20, sodium 130, creatinine 1.08, up from 0.6.  VBG was mostly unremarkable.  Hemoglobin at baseline at 10.5 EKG, personally viewed and interpreted showing sinus at 91 with no acute ST-T wave changes Patient given a 2 L NS bolus and IV insulin 15 units with improvement in blood sugar to 279 by admission.  Hospitalist consulted for admission.   Review of Systems: As mentioned in the history of present illness. All other systems reviewed and are  negative.  Past Medical History:  Diagnosis Date   C. difficile colitis 02/17/2021   Colostomy in place [facility])    h/o diverticulitis with abscess   Complicated grief 8/65/7846   COPD, mild ([place]) 09/16/2015   PFTs on 06/15/15 with FEV1/FVC ratio of 64%, FEV1 83%, DLCO 47%   Diabetes mellitus without complication (HCC)    Dyslipidemia    History of Clostridium difficile colitis 03/03/2021   Hyperosmolar hyperglycemic state (HHS) ([place]) 11/18/2020   Hypertension    Hypokalemia; hyperkalemia 07/12/2019   Lactic acidosis 11/19/2020   Lupus (HCC)    Pancreatic cancer (HCC)    RLS (restless legs syndrome)    Seizure disorder ([place])    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 She has been exposed to tobacco smoke. She has never used smokeless tobacco. She reports that she does not currently use alcohol. She reports current drug use. Drug: Marijuana.  Allergies  Allergen Reactions   Cephalosporins Itching    TOLERATED ZOSYN (PIPERACILLIN) BEFORE   Hydromorphone Itching   Keflin [Cephalothin] Itching   Lactose Intolerance (Gi) Diarrhea    Family History  Problem Relation Age of Onset   Anxiety disorder Sister    Breast cancer Maternal Aunt     Prior  to Admission medications   Medication Sig Start Date End Date Taking? Authorizing Provider  aspirin 81 MG EC tablet Take 1 tablet (81 mg total) by mouth daily. Swallow whole. 05/23/21   Pokhrel, Corrie Mckusick, MD  atorvastatin (LIPITOR) 40 MG tablet Take 1 tablet (40 mg total) by mouth Nightly. Patient taking differently: Take 40 mg by mouth at bedtime.  03/14/21 09/25/21  Fritzi Mandes, MD  diltiazem (CARDIZEM CD) 120 MG 24 hr capsule Take 1 capsule (120 mg total) by mouth daily. 10/17/21   Shelly Coss, MD  famotidine (PEPCID) 20 MG tablet Take 1 tablet (20 mg total) by mouth daily. 08/25/21   Fritzi Mandes, MD  feeding supplement, GLUCERNA SHAKE, (GLUCERNA SHAKE) LIQD Take 237 mLs by mouth 3 (three) times daily between meals. 08/24/21   Fritzi Mandes, MD  insulin aspart (NOVOLOG) 100 UNIT/ML injection Inject 5 Units into the skin 3 (three) times daily with meals. If eats more than 5)% of meals 10/16/21   Shelly Coss, MD  insulin detemir (LEVEMIR) 100 UNIT/ML injection Inject 0.09 mLs (9 Units total) into the skin daily. 10/19/21   Nicole Kindred A, DO  insulin detemir (LEVEMIR) 100 UNIT/ML injection Inject 0.11 mLs (11 Units total) into the skin at bedtime. 10/18/21   Nicole Kindred A, DO  insulin lispro (HUMALOG) 100 UNIT/ML injection 0-9 units TID with meals, and 0-5 units QHS bases on sliding scale previously prescribed to patient. 08/31/21 08/31/22  Cherene Altes, MD  melatonin 5 MG TABS Take 0.5 tablets (2.5 mg total) by mouth at bedtime. 10/16/21   Shelly Coss, MD  mirtazapine (REMERON) 15 MG tablet Take 15 mg by mouth at bedtime.    [provider]  Multiple Vitamin (MULTIVITAMIN WITH MINERALS) TABS tablet Take 1 tablet by mouth daily. 08/25/21   Fritzi Mandes, MD  nicotine (NICODERM CQ - DOSED IN MG/24 HOURS) 21 mg/24hr patch Place 1 patch (21 mg total) onto the skin daily as needed (nicotine craving). 10/16/21   Shelly Coss, MD  QUEtiapine (SEROQUEL) 25 MG tablet Take 1 tablet (25 mg total) by mouth at bedtime. 10/16/21   Shelly Coss, MD  rOPINIRole (REQUIP) 0.5 MG tablet Take 0.5 mg by mouth at bedtime. 06/29/21   [provider]  zinc sulfate 220 (50 Zn) MG capsule Take 1 capsule (220 mg total) by mouth daily. 10/17/21   Shelly Coss, MD    Physical Exam: Vitals:   10/21/21 1600 10/21/21 1645 10/21/21 1730 10/21/21  2034  BP: 122/78  (!) 154/96 (!) 136/96  Pulse: 93 92 94 89  Resp: '19 17 16 16  '$ Temp:    (!) 97.5 F (36.4 C)  TempSrc:    Oral  SpO2: 99% 100% 100% 96%  Weight:      Height:       Physical Exam Vitals and nursing note reviewed.  Constitutional:      General: She is not in acute distress. HENT:     Head: Normocephalic and atraumatic.  Cardiovascular:     Rate and Rhythm: Normal rate and regular rhythm.     Heart sounds: Normal heart sounds.  Pulmonary:     Effort: Pulmonary effort is normal.     Breath sounds: Normal breath sounds.  Abdominal:     Palpations: Abdomen is soft.     Tenderness: There is no abdominal tenderness.     Comments: colostomy  Neurological:     Mental Status: Mental status is at baseline.     Labs on Admission: I  have personally reviewed following labs and imaging studies  CBC: Recent Labs  Lab 10/16/21 0621 10/21/21 1405  WBC 8.6 6.3  HGB 10.4* 10.5*  HCT 32.4* 34.5*  MCV 91.0 95.0  PLT 443* 774   Basic Metabolic Panel: Recent Labs  Lab 10/16/21 0621 10/21/21 1405  NA 136 130*  K 5.1 6.0*  CL 105 100  CO2 24 20*  GLUCOSE 199* 581*  BUN 26* 36*  CREATININE 0.70 1.08*  CALCIUM 8.8* 9.0   GFR: Estimated Creatinine Clearance: 39.3 mL/min (A) (by C-G formula based on SCr of 1.08 mg/dL (H)). Liver Function Tests: No results for input(s): "AST", "ALT", "ALKPHOS", "BILITOT", "PROT", "ALBUMIN" in the last 168 hours. No results for input(s): "LIPASE", "AMYLASE" in the last 168 hours. No results for input(s): "AMMONIA" in the last 168 hours. Coagulation Profile: No results for input(s): "INR", "PROTIME" in the last 168 hours. Cardiac Enzymes: No results for input(s): "CKTOTAL", "CKMB", "CKMBINDEX", "TROPONINI" in the last 168 hours. BNP (last 3 results) No results for input(s): "PROBNP" in the last 8760 hours. HbA1C: No results for input(s): "HGBA1C" in the last 72 hours. CBG: Recent Labs  Lab 10/18/21 2034 10/18/21 2335  10/19/21 0734 10/21/21 1418 10/21/21 1721  GLUCAP 242* 199* 262* 555* 279*   Lipid Profile: No results for input(s): "CHOL", "HDL", "LDLCALC", "TRIG", "CHOLHDL", "LDLDIRECT" in the last 72 hours. Thyroid Function Tests: No results for input(s): "TSH", "T4TOTAL", "FREET4", "T3FREE", "THYROIDAB" in the last 72 hours. Anemia Panel: No results for input(s): "VITAMINB12", "FOLATE", "FERRITIN", "TIBC", "IRON", "RETICCTPCT" in the last 72 hours. Urine analysis:    Component Value Date/Time   COLORURINE STRAW (A) 10/21/2021 1405   APPEARANCEUR CLEAR (A) 10/21/2021 1405   APPEARANCEUR Hazy 04/04/2011 1935   LABSPEC 1.022 10/21/2021 1405   LABSPEC >1.060 04/04/2011 1935   PHURINE 5.0 10/21/2021 1405   GLUCOSEU >=500 (A) 10/21/2021 1405   GLUCOSEU 50 mg/dL 04/04/2011 1935   HGBUR NEGATIVE 10/21/2021 1405   BILIRUBINUR NEGATIVE 10/21/2021 1405   BILIRUBINUR Negative 04/04/2011 1935   KETONESUR NEGATIVE 10/21/2021 1405   PROTEINUR NEGATIVE 10/21/2021 1405   NITRITE NEGATIVE 10/21/2021 1405   LEUKOCYTESUR NEGATIVE 10/21/2021 1405   LEUKOCYTESUR Negative 04/04/2011 1935    Radiological Exams on Admission: No results found.   Data Reviewed: Relevant notes from primary care and specialist visits, past discharge summaries as available in EHR, including Care Everywhere. Prior diagnostic testing as pertinent to current admission diagnoses Updated medications and problem lists for reconciliation ED course, including vitals, labs, imaging, treatment and response to treatment Triage notes, nursing and pharmacy notes and ED provider's notes Notable results as noted in HPI   Assessment and Plan: Uncontrolled type 2 diabetes mellitus with hypoglycemia, with long-term current use of insulin ([place]) Patient states she has not had her insulin since she arrived at the nursing home on 9/7 All local pharmacies currently closed Blood sugar over 500 on arrival improving with subcutaneous  insulin Resume all home diabetes meds pending arrival of medication to rehab facility  AKI (acute kidney injury) ([place]) creatinine 1.08, up from 0.6, Suspect secondary to hyperglycemia Expecting correction with IV hydration and correction of hyperglycemia Continue to monitor  Hyperkalemia Will repeat BMP now that blood glucose has improved with hydration Has had problems recurrent hyperkalemia during recent hospitalization, correlating with hyperglycemia Serial BMP  History of stroke Continue Aspirin and Statin.  Restless leg syndrome Ropinirole 0.5 mg nightly.  Depression with anxiety Continue Remeron 15 mg daily and Seroquel 25 mg nightly  Protein-calorie malnutrition, severe Continue Remeron and nutritional supplements.  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 Zenia Resides at [facility] on 11/24/2021. Continue Pancrease  Discoid lupus erythematosus On immunosuppressive medication  previously on CellCept.     DVT prophylaxis: Lovenox  Consults: none  Advance Care Planning:   Code Status: Prior   Family Communication: none  Disposition Plan: Back to previous home environment  Severity of Illness: The appropriate patient status for this patient is OBSERVATION. Observation status is judged to be reasonable and necessary in order to provide the required intensity of service to ensure the patient's safety. The patient's presenting symptoms, physical exam findings, and initial radiographic and laboratory data in the context of their medical condition is felt to place them at decreased risk for further clinical deterioration. Furthermore, it is anticipated that the patient will be medically stable for discharge from the hospital within 2 midnights  of admission.   Author: Athena Masse, MD 10/21/2021 9:11 PM  For on call review [email or web address].

## 2021-10-21 NOTE — Assessment & Plan Note (Signed)
Continue Remeron and nutritional supplements.

## 2021-10-21 NOTE — ED Notes (Signed)
Dark green tube also sent for beta hydroxy in case ordered.

## 2021-10-21 NOTE — ED Notes (Signed)
Pt will discharge after EDP able to go talk to pt and explain discharge plan to pt. Pt has been stating that [facility] was not taking care of her and "not giving me my medicine" and "telling lies on me".

## 2021-10-21 NOTE — ED Notes (Signed)
CBG 555, pt alert and oriented.

## 2021-10-21 NOTE — Assessment & Plan Note (Signed)
Coccyx stage II, present on admission.

## 2021-10-21 NOTE — Assessment & Plan Note (Signed)
Ropinirole 0.5 mg nightly.

## 2021-10-21 NOTE — Assessment & Plan Note (Signed)
Continue Aspirin and Statin.

## 2021-10-21 NOTE — ED Notes (Signed)
Lab called, BG on labs is 581. EDP informed. IVF is infusing.

## 2021-10-22 DIAGNOSIS — E1165 Type 2 diabetes mellitus with hyperglycemia: Secondary | ICD-10-CM | POA: Diagnosis not present

## 2021-10-22 DIAGNOSIS — E875 Hyperkalemia: Secondary | ICD-10-CM

## 2021-10-22 DIAGNOSIS — N179 Acute kidney failure, unspecified: Secondary | ICD-10-CM | POA: Diagnosis not present

## 2021-10-22 DIAGNOSIS — R739 Hyperglycemia, unspecified: Secondary | ICD-10-CM

## 2021-10-22 LAB — CBC
HCT: 34.5 % — ABNORMAL LOW (ref 36.0–46.0)
Hemoglobin: 10.6 g/dL — ABNORMAL LOW (ref 12.0–15.0)
MCH: 29 pg (ref 26.0–34.0)
MCHC: 30.7 g/dL (ref 30.0–36.0)
MCV: 94.5 fL (ref 80.0–100.0)
Platelets: 436 10*3/uL — ABNORMAL HIGH (ref 150–400)
RBC: 3.65 MIL/uL — ABNORMAL LOW (ref 3.87–5.11)
RDW: 16.7 % — ABNORMAL HIGH (ref 11.5–15.5)
WBC: 6.7 10*3/uL (ref 4.0–10.5)
nRBC: 0 % (ref 0.0–0.2)

## 2021-10-22 LAB — GLUCOSE, CAPILLARY
Glucose-Capillary: 410 mg/dL — ABNORMAL HIGH (ref 70–99)
Glucose-Capillary: 63 mg/dL — ABNORMAL LOW (ref 70–99)
Glucose-Capillary: 457 mg/dL — ABNORMAL HIGH (ref 70–99)
Glucose-Capillary: 299 mg/dL — ABNORMAL HIGH (ref 70–99)
Glucose-Capillary: 145 mg/dL — ABNORMAL HIGH (ref 70–99)
Glucose-Capillary: 231 mg/dL — ABNORMAL HIGH (ref 70–99)

## 2021-10-22 LAB — BASIC METABOLIC PANEL
Anion gap: 6 (ref 5–15)
BUN: 23 mg/dL (ref 8–23)
CO2: 19 mmol/L — ABNORMAL LOW (ref 22–32)
Calcium: 8.3 mg/dL — ABNORMAL LOW (ref 8.9–10.3)
Chloride: 111 mmol/L (ref 98–111)
Creatinine, Ser: 0.74 mg/dL (ref 0.44–1.00)
GFR, Estimated: >60 mL/min (ref >60)
Glucose, Bld: 476 mg/dL — ABNORMAL HIGH (ref 70–99)
Potassium: 4.6 mmol/L (ref 3.5–5.1)
Sodium: 136 mmol/L (ref 135–145)

## 2021-10-22 MED ORDER — INSULIN DETEMIR 100 UNIT/ML ~~LOC~~ SOLN
5.0000 [IU] | Freq: Two times a day (BID) | SUBCUTANEOUS | Status: DC
Start: 2021-10-22 — End: 2021-10-23
  Administered 2021-10-22: 5 [IU] via SUBCUTANEOUS
  Filled 2021-10-22 (×3): qty 0.05

## 2021-10-22 MED ORDER — INSULIN ASPART 100 UNIT/ML IJ SOLN
9.0000 [IU] | Freq: Once | INTRAMUSCULAR | Status: AC
  Administered 2021-10-22: 9 [IU] via SUBCUTANEOUS
  Filled 2021-10-22: qty 1

## 2021-10-22 MED ORDER — INSULIN ASPART 100 UNIT/ML IJ SOLN
15.0000 [IU] | Freq: Once | INTRAMUSCULAR | Status: AC
  Administered 2021-10-22: 15 [IU] via SUBCUTANEOUS
  Filled 2021-10-22: qty 1

## 2021-10-22 NOTE — Consult Note (Signed)
[facility] Nurse ostomy consult note Contacted by staff for provision of ostomy supplies.  Patient seen by my associate on 09/26/21.  Order Supplies: 2-piece, 2 and 1/4 inch ostomy pouching system with skin barrier ring:  Pouch is [employer] # [id number], skin barrier is Kellie Simmering # [id number] and skin barrier ring is Kellie Simmering # [id number]  [place] nursing team will not follow, but will remain available to this patient, the nursing and medical teams.  Please re-consult if needed.  Thank you for inviting [place] to participate in this patient's Plan of Care.  Maudie Flakes, MSN, RN, CNS, [place], Serita Grammes, [employer], [employer] phone:  [phone]

## 2021-10-22 NOTE — Progress Notes (Signed)
Patient blood sugar 457. Dr. Reesa Chew made aware. Orders to give 15 units of Novolog and recheck BS in 1 hour. Patient asymptomatic. No distress noted. Will continue to monitor.

## 2021-10-22 NOTE — Progress Notes (Signed)
Progress Note   [name] ZOX:[id number] DOB: [birth year] DOA: 10/21/2021     0 DOS: the patient was seen and examined on 10/22/2021   Brief hospital course: Taken from H&P.  Brittany [name] is a 61 y.o. 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 followed by oncology at [facility], on pancrease, discoid lupus , RLS, depression, housing insecurity, , COPD, history of diverticulitis with abscess leading to colostomy in January 2023, insulin-dependent diabetes with multiple recent admissions for labile blood sugars with severe hyperglycemia alternating with hypoglycemia as well as hyperkalemia, protein calorie malnutrition secondary to chronic illness and history of stroke, with most recent hospitalization, 8/14 to 9/7 for nausea and vomiting with severe malnutrition, discharged to [facility] at [place], who presents to the ED because of high blood sugars.  Since arrival at the facility on 9/7, patient has not received any of her medications.  She was seen in the ED within hours of her discharge on 9/7 because of a dislodged colostomy bag which was replaced and she was discharged back to the facility.  Patient denies any complaints. ED course and data review: BP 139/102 with otherwise normal vitals.  Labs significant for glucose 581 with potassium of 6, bicarb 20, sodium 130, creatinine 1.08, up from 0.6.  VBG was mostly unremarkable.  Hemoglobin at baseline at 10.5 EKG, personally viewed and interpreted showing sinus at 91 with no acute ST-T wave changes Patient given a 2 L NS bolus and IV insulin 15 units with improvement in blood sugar to 279 by admission.   9/10: Hemodynamically stable.  Hyperkalemia resolved with IV fluid and insulin.  Had 1 episode of hypoglycemia with CBG dropped to 63.  Patient with labile diabetes. Multiple hospitalizations and ED visits when her facility failed to manage her diabetes  appropriately and not giving medications properly.  Changing Levemir to 5 units twice daily. Patient is under DSS. Facility refuse to take her back on Sunday, can go tomorrow.   Assessment and Plan: Uncontrolled type 2 diabetes mellitus with hyperglycemia, with long-term current use of insulin [facility]) Patient states she has not had her insulin since she arrived at the nursing home on 9/7 All local pharmacies currently closed Blood sugar over 500 on arrival improving with subcutaneous insulin, 1 episode of hypoglycemia as patient has labile diabetes. -Switch Levemir to 5 units twice daily -Continue with SSI and mealtime coverage -Continue to monitor  Hyperkalemia Resolved with IV fluid and insulin. -Current continue to monitor  AKI (acute kidney injury) ([place]) Improved with IV fluid. -Monitor renal function  History of stroke -Continue Aspirin and Statin.  Restless leg syndrome Ropinirole 0.5 mg nightly.  Discoid lupus erythematosus On immunosuppressive medication  previously on CellCept.  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 Zenia Resides at [facility] on 11/24/2021. Continue Pancrease  Depression with anxiety Continue Remeron 15 mg daily and Seroquel 25 mg nightly  Protein-calorie malnutrition, severe Continue Remeron and nutritional supplements.  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      Subjective: Patient was seen and examined today.  She was complaining that she was not getting her medications at the facility.  No other complaints.  Physical Exam: Vitals:   10/21/21 2034 10/22/21 0000 10/22/21 0413 10/22/21 0810  BP: (!) 136/96 (!) 154/97 114/77 122/86  Pulse: 89 92 96 90  Resp: '16 16 16 18  '$ Temp: (!) 97.5 F (36.4 C)  97.6 F (36.4 C) (!) 97.3 F (36.3 C)  TempSrc:  Oral   Oral  SpO2: 96% 95% 98% 99%  Weight:      Height:       General.  Malnourished lady, in no acute distress. Pulmonary.  Lungs clear bilaterally, normal respiratory effort. CV.  Regular rate and rhythm, no JVD, rub or murmur. Abdomen.  Soft, nontender, nondistended, BS positive.  Colostomy in place. CNS.  Alert and oriented .  No focal neurologic deficit. Extremities.  No edema, no cyanosis, pulses intact and symmetrical. Psychiatry.  Judgment and insight appears normal.  Data Reviewed: Prior data reviewed  Family Communication: Patient had a DSS social worker  Disposition: Status is: Observation The patient remains OBS appropriate and will d/c before 2 midnights.  Planned Discharge Destination: Skilled nursing facility  DVT prophylaxis.  Lovenox Time spent: 43 minutes  This record has been created using Systems analyst. Errors have been sought and corrected,but may not always be located. Such creation errors do not reflect on the standard of care.  Author: Lorella Nimrod, MD 10/22/2021 2:53 PM  For on call review [email or web address].

## 2021-10-22 NOTE — Hospital Course (Addendum)
Taken from H&P.  Brittany [name] is a 61 y.o. 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 followed by oncology at [facility], on pancrease, discoid lupus , RLS, depression, housing insecurity, , COPD, history of diverticulitis with abscess leading to colostomy in January 2023, insulin-dependent diabetes with multiple recent admissions for labile blood sugars with severe hyperglycemia alternating with hypoglycemia as well as hyperkalemia, protein calorie malnutrition secondary to chronic illness and history of stroke, with most recent hospitalization, 8/14 to 9/7 for nausea and vomiting with severe malnutrition, discharged to [place] at [place], who presents to the ED because of high blood sugars.  Since arrival at the facility on 9/7, patient has not received any of her medications.  She was seen in the ED within hours of her discharge on 9/7 because of a dislodged colostomy bag which was replaced and she was discharged back to the facility.  Patient denies any complaints. ED course and data review: BP 139/102 with otherwise normal vitals.  Labs significant for glucose 581 with potassium of 6, bicarb 20, sodium 130, creatinine 1.08, up from 0.6.  VBG was mostly unremarkable.  Hemoglobin at baseline at 10.5 EKG, personally viewed and interpreted showing sinus at 91 with no acute ST-T wave changes Patient given a 2 L NS bolus and IV insulin 15 units with improvement in blood sugar to 279 by admission.   9/10: Hemodynamically stable.  Hyperkalemia resolved with IV fluid and insulin.  Had 1 episode of hypoglycemia with CBG dropped to 63.  Patient with labile diabetes. Multiple hospitalizations and ED visits when her facility failed to manage her diabetes appropriately and not giving medications properly.  Changing Levemir to 5 units twice daily. Patient is under DSS. Facility refuse to take her back on Sunday, can go tomorrow.

## 2021-10-22 NOTE — TOC CM/SW Note (Addendum)
Per chart review, patient discharged to [facility] last week. She had previously been under a protective order through DSS. According to coworker's notes, she has a newly assigned DSS Education officer, museum. Emailed DSS supervisor to obtain her contact information.   Dayton Scrape, [place]  11:42 am: [facility] is unable to accept patient back today. Would have to be tomorrow between 10:00-1:00. MD is aware.  Dayton Scrape, [place]

## 2021-10-23 DIAGNOSIS — E875 Hyperkalemia: Secondary | ICD-10-CM | POA: Diagnosis not present

## 2021-10-23 DIAGNOSIS — E1165 Type 2 diabetes mellitus with hyperglycemia: Secondary | ICD-10-CM | POA: Diagnosis not present

## 2021-10-23 DIAGNOSIS — R739 Hyperglycemia, unspecified: Secondary | ICD-10-CM | POA: Diagnosis not present

## 2021-10-23 DIAGNOSIS — N179 Acute kidney failure, unspecified: Secondary | ICD-10-CM | POA: Diagnosis not present

## 2021-10-23 LAB — GLUCOSE, CAPILLARY: Glucose-Capillary: 426 mg/dL — ABNORMAL HIGH (ref 70–99)

## 2021-10-23 MED ORDER — INSULIN DETEMIR 100 UNIT/ML ~~LOC~~ SOLN
8.0000 [IU] | Freq: Two times a day (BID) | SUBCUTANEOUS | Status: DC
  Filled 2021-10-23: qty 0.08

## 2021-10-23 MED ORDER — INSULIN DETEMIR 100 UNIT/ML ~~LOC~~ SOLN: 8.0000 [IU] | Freq: Two times a day (BID) | SUBCUTANEOUS | 11 refills | Status: DC

## 2021-10-23 NOTE — TOC Progression Note (Signed)
Transition of Care [facility]) - Progression Note    Patient Details  Name: Brittany [name] MRN: [id number] Date of Birth: 07-15-1960  Transition of Care [facility]) CM/SW Contact  Beverly Sessions, RN Phone Number: 10/23/2021, 11:36 AM  Clinical Narrative:     Patient to return to [place] today Paper Fl2 completed per [place] summary secure emailed to [place] at [facility].  [facility] confirms they have all the needed paperwork, and have all of patient's medications in house  [facility] has arranged transport to pick up patient today for discharge Per [place] Bed[facility] RN does not need to call report  VM left for Abigail Butts at [place] to update        Expected Discharge Plan and Services           Expected Discharge Date: 10/23/21                                     Social Determinants of Health ([place]) Interventions    Readmission Risk Interventions    09/22/2021    1:24 PM 03/06/2021    9:13 AM 02/16/2021    2:45 PM  Readmission Risk Prevention Plan  Transportation Screening Complete Complete Complete  Medication Review Press photographer) Complete Complete Complete  PCP or Specialist appointment within 3-5 days of discharge Complete Complete   HRI or [place] Complete    SW Recovery Care/Counseling Consult Complete  Complete  Palliative Care Screening Not Applicable Not Applicable Not [facility] Complete Not Applicable Not Applicable

## 2021-10-23 NOTE — Progress Notes (Signed)
[name] to be D/C'd [place] per MD order.  Discussed prescriptions and follow up appointments with the patient.  Allergies as of 10/23/2021       Reactions   Cephalosporins Itching   TOLERATED ZOSYN (PIPERACILLIN) BEFORE   Hydromorphone Itching   Keflin [cephalothin] Itching   Lactose Intolerance (gi) Diarrhea        Medication List     TAKE these medications    aspirin EC 81 MG tablet Take 1 tablet (81 mg total) by mouth daily. Swallow whole.   atorvastatin 40 MG tablet Commonly known as: LIPITOR Take 1 tablet (40 mg total) by mouth Nightly. What changed: when to take this   diltiazem 120 MG 24 hr capsule Commonly known as: CARDIZEM CD Take 1 capsule (120 mg total) by mouth daily.   famotidine 20 MG tablet Commonly known as: PEPCID Take 1 tablet (20 mg total) by mouth daily.   feeding supplement (GLUCERNA SHAKE) Liqd Take 237 mLs by mouth 3 (three) times daily between meals.   insulin aspart 100 UNIT/ML injection Commonly known as: novoLOG Inject 5 Units into the skin 3 (three) times daily with meals. If eats more than 5)% of meals What changed:  how much to take additional instructions   insulin detemir 100 UNIT/ML injection Commonly known as: LEVEMIR Inject 0.08 mLs (8 Units total) into the skin 2 (two) times daily. What changed:  how much to take when to take this Another medication with the same name was removed. Continue taking this medication, and follow the directions you see here.   insulin lispro 100 UNIT/ML injection Commonly known as: HumaLOG 0-9 units TID with meals, and 0-5 units QHS bases on sliding scale previously prescribed to patient.   melatonin 5 MG Tabs Take 0.5 tablets (2.5 mg total) by mouth at bedtime.   mirtazapine 15 MG tablet Commonly known as: REMERON Take 15 mg by mouth at bedtime.   multivitamin with minerals Tabs tablet Take 1 tablet by mouth daily.   nicotine 21 mg/24hr  patch Commonly known as: NICODERM CQ - dosed in mg/24 hours Place 1 patch (21 mg total) onto the skin daily as needed (nicotine craving).   QUEtiapine 25 MG tablet Commonly known as: SEROQUEL Take 1 tablet (25 mg total) by mouth at bedtime.   rOPINIRole 0.5 MG tablet Commonly known as: REQUIP Take 0.5 mg by mouth at bedtime.   zinc sulfate 220 (50 Zn) MG capsule Take 1 capsule (220 mg total) by mouth daily.        Vitals:   10/22/21 2130 10/23/21 1120  BP: (!) 140/90 132/82  Pulse: 84 90  Resp: 16   Temp: 98.4 F (36.9 C) 97.6 F (36.4 C)  SpO2: 97% 100%    IV catheter discontinued intact. Site without signs and symptoms of complications. Dressing and pressure applied. Pt denies pain at this time. No complaints noted.  An After Visit Summary was printed and given to the patient. Patient escorted via [place], and D/C TO [place] , transportation here to pick up pt. Rolley Sims

## 2021-10-23 NOTE — Discharge Summary (Signed)
Physician Discharge Summary   [name] MRN: [id number] DOB: [birth year]  Admit date:     10/21/2021  Discharge date: 10/23/21  Discharge Physician: Lorella Nimrod   PCP: [name], NP   Recommendations at discharge:  Please obtain CBC and BMP in 1 week Please monitor CBG very closely and administer insulin appropriately, patient has labile diabetes. 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  Brittany [name] is a 61 y.o. 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 followed by oncology at [facility], on pancrease, discoid lupus , RLS, depression, housing insecurity, , COPD, history of diverticulitis with abscess leading to colostomy in January 2023, insulin-dependent diabetes with multiple recent admissions for labile blood sugars with severe hyperglycemia alternating with hypoglycemia as well as hyperkalemia, protein calorie malnutrition secondary to chronic illness and history of stroke, with most recent hospitalization, 8/14 to 9/7 for nausea and vomiting with severe malnutrition, discharged to [place] at [place], who presents to the ED because of high blood sugars.  Since arrival at the facility on 9/7, patient has not received any of her medications.  She was seen in the ED within hours of her  discharge on 9/7 because of a dislodged colostomy bag which was replaced and she was discharged back to the facility.  Patient denies any complaints. ED course and data review: BP 139/102 with otherwise normal vitals.  Labs significant for glucose 581 with potassium of 6, bicarb 20, sodium 130, creatinine 1.08, up from 0.6.  VBG was mostly unremarkable.  Hemoglobin at baseline at 10.5 EKG, personally viewed and interpreted showing sinus at 91 with no acute ST-T wave changes Patient given a 2 L NS bolus and IV insulin 15 units with improvement in blood sugar to 279 by admission.   9/10: Hemodynamically stable.  Hyperkalemia resolved with IV fluid and insulin.  Had 1 episode of hypoglycemia with CBG dropped to 63.  Patient with labile diabetes. Multiple hospitalizations and ED visits when her facility failed to manage her diabetes appropriately and not giving medications properly.  Changing Levemir to 5 units twice daily. Hyperkalemia resolved.  AKI resolved. Patient is under DSS. Facility refuse to take her back on Sunday, can go tomorrow.  9/11: Patient remained stable.  Has labile blood glucose level and she need very close monitoring and appropriate use of insulin which she was not getting at the facility resulted in this admission.  Facility has to administer her insulin regularly and carefully and check blood glucose level at least 4 times daily. We increased the dose of Levemir to 8 units twice daily, she will continue taking 5 units of NovoLog with meals if eating more than 50% of meals along with sensitive SSI at the facility.  Patient will continue the rest of her home medications and need to have a close follow-up with her  providers for further recommendations.  Assessment and Plan: Uncontrolled type 2 diabetes mellitus with hyperglycemia, with long-term current use of insulin [facility]) Patient states she has not had her insulin since she arrived at the nursing home on 9/7 All local  pharmacies currently closed Blood sugar over 500 on arrival improving with subcutaneous insulin, 1 episode of hypoglycemia as patient has labile diabetes. -Switch Levemir to 5 units twice daily -Continue with SSI and mealtime coverage -Continue to monitor  Hyperkalemia Resolved with IV fluid and insulin. -Current continue to monitor  AKI (acute kidney injury) ([place]) Improved with IV fluid. -Monitor renal function  History of stroke -Continue Aspirin and Statin.  Restless leg syndrome Ropinirole 0.5 mg nightly.  Discoid lupus erythematosus On immunosuppressive medication  previously on CellCept.  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 Zenia Resides at [facility] on 11/24/2021. Continue Pancrease  Depression with anxiety Continue Remeron 15 mg daily and Seroquel 25 mg nightly  Protein-calorie malnutrition, severe Continue Remeron and nutritional supplements.  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    Consultants: None Procedures performed: None Disposition: Skilled nursing facility Diet recommendation:  Discharge Diet Orders (From admission, onward)     Start     Ordered   10/23/21 0000  Diet - low sodium heart healthy        10/23/21 0946           Cardiac and Carb modified diet DISCHARGE MEDICATION: Allergies as of 10/23/2021       Reactions   Cephalosporins Itching   TOLERATED ZOSYN (PIPERACILLIN) BEFORE   Hydromorphone Itching   Keflin [cephalothin] Itching   Lactose Intolerance (gi) Diarrhea        Medication List     TAKE these medications    aspirin EC 81 MG tablet Take 1 tablet (81 mg total) by mouth daily. Swallow whole.   atorvastatin 40 MG tablet Commonly known as: LIPITOR Take 1 tablet (40 mg total) by mouth Nightly. What changed: when to take  this   diltiazem 120 MG 24 hr capsule Commonly known as: CARDIZEM CD Take 1 capsule (120 mg total) by mouth daily.   famotidine 20 MG tablet Commonly known as: PEPCID Take 1 tablet (20 mg total) by mouth daily.   feeding supplement (GLUCERNA SHAKE) Liqd Take 237 mLs by mouth 3 (three) times daily between meals.   insulin aspart 100 UNIT/ML injection Commonly known as: novoLOG Inject 5 Units into the skin 3 (three) times daily with meals. If eats more than 5)% of meals What changed:  how much to take additional instructions   insulin detemir 100 UNIT/ML injection Commonly known as: LEVEMIR Inject 0.08 mLs (8 Units total) into the skin 2 (two) times daily. What changed:  how much to take when to take this Another medication with the same name was removed. Continue taking this medication, and follow the directions you see here.   insulin lispro 100 UNIT/ML injection Commonly known as: HumaLOG 0-9 units TID with meals, and 0-5 units QHS bases on sliding scale previously prescribed to patient.   melatonin 5 MG Tabs Take 0.5 tablets (2.5 mg total) by mouth at bedtime.   mirtazapine 15 MG tablet Commonly known as: REMERON Take 15 mg by mouth at bedtime.   multivitamin with minerals Tabs tablet Take 1 tablet by mouth daily.   nicotine 21 mg/24hr patch Commonly known  as: NICODERM CQ - dosed in mg/24 hours Place 1 patch (21 mg total) onto the skin daily as needed (nicotine craving).   QUEtiapine 25 MG tablet Commonly known as: SEROQUEL Take 1 tablet (25 mg total) by mouth at bedtime.   rOPINIRole 0.5 MG tablet Commonly known as: REQUIP Take 0.5 mg by mouth at bedtime.   zinc sulfate 220 (50 Zn) MG capsule Take 1 capsule (220 mg total) by mouth daily.        Follow-up Information     [name], NP Follow up.   Specialty: Internal Medicine Contact information: [place] [address] [phone]                Discharge  Exam: Filed Weights   10/21/21 1403  Weight: 53.1 kg   General.  Malnourished lady, in no acute distress. Pulmonary.  Lungs clear bilaterally, normal respiratory effort. CV.  Regular rate and rhythm, no JVD, rub or murmur. Abdomen.  Soft, nontender, nondistended, BS positive. CNS.  Alert and oriented .  No focal neurologic deficit. Extremities.  No edema, no cyanosis, pulses intact and symmetrical. Psychiatry.  Judgment and insight appears normal.   Condition at discharge: stable  The results of significant diagnostics from this hospitalization (including imaging, microbiology, ancillary and laboratory) are listed below for reference.   Imaging Studies: No results found.  Microbiology: Results for orders placed or performed during the hospital encounter of 09/21/21  MRSA Next Gen by PCR, Nasal     Status: None   Collection Time: 09/21/21  6:41 PM   Specimen: Nasal Mucosa; Nasal Swab  Result Value Ref Range Status   MRSA by PCR Next Gen NOT DETECTED NOT DETECTED Final    Comment: (NOTE) The GeneXpert MRSA Assay (FDA approved for NASAL specimens only), is one component of a comprehensive MRSA colonization surveillance program. It is not intended to diagnose MRSA infection nor to guide or monitor treatment for MRSA infections. Test performance is not FDA approved in patients less than 55 years old. Performed at [facility], [address] [place], [place] [address]     Labs: CBC: Recent Labs  Lab 10/21/21 1405 10/22/21 0135  WBC 6.3 6.7  HGB 10.5* 10.6*  HCT 34.5* 34.5*  MCV 95.0 94.5  PLT 352 726*   Basic Metabolic Panel: Recent Labs  Lab 10/21/21 1405 10/22/21 0135  NA 130* 136  K 6.0* 4.6  CL 100 111  CO2 20* 19*  GLUCOSE 581* 476*  BUN 36* 23  CREATININE 1.08* 0.74  CALCIUM 9.0 8.3*   Liver Function Tests: No results for input(s): "AST", "ALT", "ALKPHOS", "BILITOT", "PROT", "ALBUMIN" in the last 168 hours. CBG: Recent Labs  Lab  10/22/21 0752 10/22/21 1155 10/22/21 1407 10/22/21 1639 10/22/21 2128  GLUCAP 63* 457* 299* 145* 231*    Discharge time spent: greater than 30 minutes.  This record has been created using Systems analyst. Errors have been sought and corrected,but may not always be located. Such creation errors do not reflect on the standard of care.   Signed: Lorella Nimrod, MD [facility] 10/23/2021

## 2021-10-23 NOTE — Progress Notes (Signed)
Pt pulled off colostomy and wafer, stool noted in bed, floor, all over patient, patient cleaned, bath given, linen change done.

## 2021-10-24 ENCOUNTER — Emergency Department
Admission: EM | Admit: 2021-10-24 | Discharge: 2021-10-24 | Disposition: A | Discharge status: Home / Self Care | Payer: Medicare Other | Attending: Emergency Medicine | Admitting: Emergency Medicine

## 2021-10-24 ENCOUNTER — Encounter: Payer: Self-pay | Admitting: Emergency Medicine

## 2021-10-24 ENCOUNTER — Other Ambulatory Visit: Payer: Self-pay

## 2021-10-24 DIAGNOSIS — K9400 Colostomy complication, unspecified: Secondary | ICD-10-CM | POA: Diagnosis present
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
## 2021-10-24 NOTE — ED Provider Notes (Signed)
   [facility] Provider Note   Event Date/Time   First MD Initiated Contact with Patient 10/24/21 1040     (approximate) History  colostomy bag change  HPI TABITHIA STRODER is a 61 y.o. female who presents from her long-term care facility for change of her colostomy bag.  Per EMS, patient's facility was not equipped to change the colostomy bag today.  Patient denies any other complaints ROS: Patient currently denies any vision changes, tinnitus, difficulty speaking, facial droop, sore throat, chest pain, shortness of breath, abdominal pain, nausea/vomiting/diarrhea, dysuria, or weakness/numbness/paresthesias in any extremity   Physical Exam  Triage Vital Signs: ED Triage Vitals  Enc Vitals Group     BP 10/24/21 1041 113/82     Pulse Rate 10/24/21 1041 80     Resp 10/24/21 1041 16     Temp 10/24/21 1041 97.9 F (36.6 C)     Temp Source 10/24/21 1041 Oral     SpO2 10/24/21 1041 98 %     Weight 10/24/21 1042 117 lb (53.1 kg)     Height 10/24/21 1042 5' (1.524 m)     Head Circumference --      Peak Flow --      Pain Score --      Pain Loc --      Pain Edu? --      Excl. in [place]? --    Most recent vital signs: Vitals:   10/24/21 1041  BP: 113/82  Pulse: 80  Resp: 16  Temp: 97.9 F (36.6 C)  SpO2: 98%   General: Awake, oriented x4. CV:  Good peripheral perfusion.  Resp:  Normal effort.  Abd:  No distention.  If ostomy in place with normal stool.  Replaced at bedside Other:  Middle-aged African-American female laying in bed in no acute distress ED Results / Procedures / Treatments   PROCEDURES: Critical Care performed: No Procedures MEDICATIONS ORDERED IN ED: Medications - No data to display IMPRESSION / MDM / [place] / ED COURSE  I reviewed the triage vital signs and the nursing notes.                             The patient is on the cardiac monitor to evaluate for evidence of arrhythmia and/or significant heart rate  changes. Patient's presentation is most consistent with acute presentation with potential threat to life or bodily function. Patient is 61 year old female with past medical history of of colostomy in place presents for request to change her colostomy bag as her long-term care facility was unable to.  Patient's colostomy bag was changed at bedside without complication and normal stool was seen in the bag prior to discharge.  Patient is stable for discharge at this time to her long-term care facility and primary care follow-up as needed.  Dispo: Discharge to facility   FINAL CLINICAL IMPRESSION(S) / ED DIAGNOSES   Final diagnoses:  Colostomy complication ([place])   Rx / DC Orders   ED Discharge Orders     None      Note:  This document was prepared using Dragon voice recognition software and may include unintentional dictation errors.   Naaman Plummer, MD 10/24/21 1304

## 2021-10-24 NOTE — ED Triage Notes (Signed)
Presents via EMS from [facility]   Per EMS she is for colostomy bag change

## 2021-10-24 NOTE — ED Notes (Signed)
Pt's colostomy bag changed  Skin cleansed  Tolerated well

## 2021-10-24 NOTE — ED Notes (Signed)
Report called to [facility]  They are requesting ostomy bag supplies and also states they need an order for ostomy nurse to evaluate her at the facility

## 2021-10-25 LAB — BLOOD GAS, VENOUS
Acid-base deficit: 5.5 mmol/L — ABNORMAL HIGH (ref 0.0–2.0)
Bicarbonate: 21.5 mmol/L (ref 20.0–28.0)
Drawn by: 5514
Patient temperature: 37
pCO2, Ven: 48 mmHg (ref 44–60)
pH, Ven: 7.26 (ref 7.25–7.43)

## 2021-10-28 ENCOUNTER — Inpatient Hospital Stay (HOSPITAL_COMMUNITY)
Admission: EM | Admit: 2021-10-28 | Discharge: 2021-11-09 | DRG: 101 | Disposition: A | Discharge status: Skilled Nursing Facility | Payer: Medicare Other | Source: Skilled Nursing Facility | Attending: Neurology | Admitting: Neurology

## 2021-10-28 ENCOUNTER — Emergency Department (HOSPITAL_COMMUNITY): Payer: Medicare Other

## 2021-10-28 ENCOUNTER — Inpatient Hospital Stay (HOSPITAL_COMMUNITY): Payer: Medicare Other

## 2021-10-28 ENCOUNTER — Other Ambulatory Visit: Payer: Self-pay

## 2021-10-28 DIAGNOSIS — G40109 Localization-related (focal) (partial) symptomatic epilepsy and epileptic syndromes with simple partial seizures, not intractable, without status epilepticus: Secondary | ICD-10-CM | POA: Diagnosis present

## 2021-10-28 DIAGNOSIS — I10 Essential (primary) hypertension: Secondary | ICD-10-CM | POA: Diagnosis present

## 2021-10-28 DIAGNOSIS — I639 Cerebral infarction, unspecified: Secondary | ICD-10-CM | POA: Diagnosis present

## 2021-10-28 DIAGNOSIS — G2581 Restless legs syndrome: Secondary | ICD-10-CM | POA: Diagnosis present

## 2021-10-28 DIAGNOSIS — R29810 Facial weakness: Secondary | ICD-10-CM | POA: Diagnosis present

## 2021-10-28 DIAGNOSIS — Z781 Physical restraint status: Secondary | ICD-10-CM

## 2021-10-28 DIAGNOSIS — R29718 NIHSS score 18: Secondary | ICD-10-CM | POA: Diagnosis present

## 2021-10-28 DIAGNOSIS — F419 Anxiety disorder, unspecified: Secondary | ICD-10-CM | POA: Diagnosis present

## 2021-10-28 DIAGNOSIS — R4701 Aphasia: Secondary | ICD-10-CM | POA: Diagnosis present

## 2021-10-28 DIAGNOSIS — E739 Lactose intolerance, unspecified: Secondary | ICD-10-CM | POA: Diagnosis present

## 2021-10-28 DIAGNOSIS — R739 Hyperglycemia, unspecified: Secondary | ICD-10-CM | POA: Diagnosis not present

## 2021-10-28 DIAGNOSIS — Z803 Family history of malignant neoplasm of breast: Secondary | ICD-10-CM

## 2021-10-28 DIAGNOSIS — I6389 Other cerebral infarction: Secondary | ICD-10-CM | POA: Diagnosis not present

## 2021-10-28 DIAGNOSIS — F32A Depression, unspecified: Secondary | ICD-10-CM | POA: Diagnosis present

## 2021-10-28 DIAGNOSIS — Z794 Long term (current) use of insulin: Secondary | ICD-10-CM | POA: Diagnosis not present

## 2021-10-28 DIAGNOSIS — Z7982 Long term (current) use of aspirin: Secondary | ICD-10-CM | POA: Diagnosis not present

## 2021-10-28 DIAGNOSIS — Z9081 Acquired absence of spleen: Secondary | ICD-10-CM | POA: Diagnosis not present

## 2021-10-28 DIAGNOSIS — Z90411 Acquired partial absence of pancreas: Secondary | ICD-10-CM

## 2021-10-28 DIAGNOSIS — F17210 Nicotine dependence, cigarettes, uncomplicated: Secondary | ICD-10-CM | POA: Diagnosis present

## 2021-10-28 DIAGNOSIS — E7800 Pure hypercholesterolemia, unspecified: Secondary | ICD-10-CM | POA: Diagnosis not present

## 2021-10-28 DIAGNOSIS — Z20822 Contact with and (suspected) exposure to covid-19: Secondary | ICD-10-CM | POA: Diagnosis present

## 2021-10-28 DIAGNOSIS — E1165 Type 2 diabetes mellitus with hyperglycemia: Secondary | ICD-10-CM | POA: Diagnosis present

## 2021-10-28 DIAGNOSIS — Z8673 Personal history of transient ischemic attack (TIA), and cerebral infarction without residual deficits: Secondary | ICD-10-CM

## 2021-10-28 DIAGNOSIS — Z8507 Personal history of malignant neoplasm of pancreas: Secondary | ICD-10-CM | POA: Diagnosis not present

## 2021-10-28 DIAGNOSIS — R569 Unspecified convulsions: Secondary | ICD-10-CM | POA: Diagnosis not present

## 2021-10-28 DIAGNOSIS — R451 Restlessness and agitation: Secondary | ICD-10-CM | POA: Diagnosis not present

## 2021-10-28 DIAGNOSIS — Z818 Family history of other mental and behavioral disorders: Secondary | ICD-10-CM | POA: Diagnosis not present

## 2021-10-28 DIAGNOSIS — J449 Chronic obstructive pulmonary disease, unspecified: Secondary | ICD-10-CM | POA: Diagnosis present

## 2021-10-28 DIAGNOSIS — R410 Disorientation, unspecified: Secondary | ICD-10-CM | POA: Diagnosis not present

## 2021-10-28 DIAGNOSIS — E11649 Type 2 diabetes mellitus with hypoglycemia without coma: Secondary | ICD-10-CM | POA: Diagnosis not present

## 2021-10-28 DIAGNOSIS — L930 Discoid lupus erythematosus: Secondary | ICD-10-CM | POA: Diagnosis present

## 2021-10-28 DIAGNOSIS — R531 Weakness: Principal | ICD-10-CM

## 2021-10-28 DIAGNOSIS — E785 Hyperlipidemia, unspecified: Secondary | ICD-10-CM | POA: Diagnosis present

## 2021-10-28 LAB — COMPREHENSIVE METABOLIC PANEL
ALT: 50 U/L — ABNORMAL HIGH (ref 0–44)
AST: 45 U/L — ABNORMAL HIGH (ref 15–41)
Albumin: 3.1 g/dL — ABNORMAL LOW (ref 3.5–5.0)
Alkaline Phosphatase: 190 U/L — ABNORMAL HIGH (ref 38–126)
Anion gap: 11 (ref 5–15)
BUN: 20 mg/dL (ref 8–23)
CO2: 14 mmol/L — ABNORMAL LOW (ref 22–32)
Calcium: 8.4 mg/dL — ABNORMAL LOW (ref 8.9–10.3)
Chloride: 101 mmol/L (ref 98–111)
Creatinine, Ser: 0.87 mg/dL (ref 0.44–1.00)
GFR, Estimated: >60 mL/min (ref >60)
Glucose, Bld: 545 mg/dL (ref 70–99)
Potassium: 4.7 mmol/L (ref 3.5–5.1)
Sodium: 126 mmol/L — ABNORMAL LOW (ref 135–145)
Total Bilirubin: 0.5 mg/dL (ref 0.3–1.2)
Total Protein: 8.2 g/dL — ABNORMAL HIGH (ref 6.5–8.1)

## 2021-10-28 LAB — I-STAT CHEM 8, ED
BUN: 20 mg/dL (ref 8–23)
Calcium, Ion: 1 mmol/L — ABNORMAL LOW (ref 1.15–1.40)
Chloride: 99 mmol/L (ref 98–111)
Creatinine, Ser: 0.6 mg/dL (ref 0.44–1.00)
Glucose, Bld: 561 mg/dL (ref 70–99)
HCT: 39 % (ref 36.0–46.0)
Hemoglobin: 13.3 g/dL (ref 12.0–15.0)
Potassium: 4.4 mmol/L (ref 3.5–5.1)
Sodium: 127 mmol/L — ABNORMAL LOW (ref 135–145)
TCO2: 18 mmol/L — ABNORMAL LOW (ref 22–32)

## 2021-10-28 LAB — DIFFERENTIAL
Abs Immature Granulocytes: 0.05 10*3/uL (ref 0.00–0.07)
Basophils Absolute: 0 10*3/uL (ref 0.0–0.1)
Basophils Relative: 1 %
Eosinophils Absolute: 0.1 10*3/uL (ref 0.0–0.5)
Eosinophils Relative: 1 %
Immature Granulocytes: 1 %
Lymphocytes Relative: 38 %
Lymphs Abs: 2.5 10*3/uL (ref 0.7–4.0)
Monocytes Absolute: 0.7 10*3/uL (ref 0.1–1.0)
Monocytes Relative: 11 %
Neutro Abs: 3.3 10*3/uL (ref 1.7–7.7)
Neutrophils Relative %: 48 %

## 2021-10-28 LAB — URINALYSIS, ROUTINE W REFLEX MICROSCOPIC
Bilirubin Urine: NEGATIVE
Glucose, UA: >=500 mg/dL — AB
Hgb urine dipstick: NEGATIVE
Ketones, ur: NEGATIVE mg/dL
Leukocytes,Ua: NEGATIVE
Nitrite: NEGATIVE
Protein, ur: NEGATIVE mg/dL
Specific Gravity, Urine: 1.022 (ref 1.005–1.030)
pH: 5 (ref 5.0–8.0)

## 2021-10-28 LAB — I-STAT VENOUS BLOOD GAS, ED
Acid-base deficit: 4 mmol/L — ABNORMAL HIGH (ref 0.0–2.0)
Bicarbonate: 18.7 mmol/L — ABNORMAL LOW (ref 20.0–28.0)
Calcium, Ion: 0.99 mmol/L — ABNORMAL LOW (ref 1.15–1.40)
HCT: 37 % (ref 36.0–46.0)
Hemoglobin: 12.6 g/dL (ref 12.0–15.0)
O2 Saturation: 99 %
Potassium: 4.5 mmol/L (ref 3.5–5.1)
Sodium: 127 mmol/L — ABNORMAL LOW (ref 135–145)
TCO2: 20 mmol/L — ABNORMAL LOW (ref 22–32)
pCO2, Ven: 27.2 mmHg — ABNORMAL LOW (ref 44–60)
pH, Ven: 7.446 — ABNORMAL HIGH (ref 7.25–7.43)
pO2, Ven: 151 mmHg — ABNORMAL HIGH (ref 32–45)

## 2021-10-28 LAB — CBC
HCT: 35.4 % — ABNORMAL LOW (ref 36.0–46.0)
Hemoglobin: 11.1 g/dL — ABNORMAL LOW (ref 12.0–15.0)
MCH: 29.8 pg (ref 26.0–34.0)
MCHC: 31.4 g/dL (ref 30.0–36.0)
MCV: 94.9 fL (ref 80.0–100.0)
Platelets: 419 10*3/uL — ABNORMAL HIGH (ref 150–400)
RBC: 3.73 MIL/uL — ABNORMAL LOW (ref 3.87–5.11)
RDW: 15.8 % — ABNORMAL HIGH (ref 11.5–15.5)
WBC: 6.7 10*3/uL (ref 4.0–10.5)
nRBC: 0 % (ref 0.0–0.2)

## 2021-10-28 LAB — GLUCOSE, CAPILLARY
Glucose-Capillary: 157 mg/dL — ABNORMAL HIGH (ref 70–99)
Glucose-Capillary: 264 mg/dL — ABNORMAL HIGH (ref 70–99)

## 2021-10-28 LAB — PROTIME-INR
INR: 1 (ref 0.8–1.2)
Prothrombin Time: 13.5 seconds (ref 11.4–15.2)

## 2021-10-28 LAB — CBG MONITORING, ED: Glucose-Capillary: 472 mg/dL — ABNORMAL HIGH (ref 70–99)

## 2021-10-28 LAB — ETHANOL: Alcohol, Ethyl (B): <10 mg/dL (ref <10)

## 2021-10-28 LAB — APTT: aPTT: 26 seconds (ref 24–36)

## 2021-10-28 MED ORDER — ORAL CARE MOUTH RINSE: 15.0000 mL | OROMUCOSAL | Status: DC | PRN

## 2021-10-28 MED ORDER — SODIUM CHLORIDE 0.9 % IV SOLN: INTRAVENOUS | Status: DC

## 2021-10-28 MED ORDER — INSULIN DETEMIR 100 UNIT/ML ~~LOC~~ SOLN
11.0000 [IU] | Freq: Every day | SUBCUTANEOUS | Status: DC
  Administered 2021-10-28 – 2021-11-08 (×12): 11 [IU] via SUBCUTANEOUS
  Filled 2021-10-28 (×15): qty 0.11

## 2021-10-28 MED ORDER — SODIUM CHLORIDE 0.9 % IV SOLN: Freq: Once | INTRAVENOUS | Status: AC

## 2021-10-28 MED ORDER — STROKE: EARLY STAGES OF RECOVERY BOOK
Freq: Once | Status: AC
  Administered 2021-10-29: 1
  Filled 2021-10-28: qty 1

## 2021-10-28 MED ORDER — SODIUM CHLORIDE 0.9 % IV SOLN
50.0000 mg | Freq: Two times a day (BID) | INTRAVENOUS | Status: DC
  Administered 2021-10-29: 50 mg via INTRAVENOUS
  Filled 2021-10-28 (×2): qty 5

## 2021-10-28 MED ORDER — ACETAMINOPHEN 650 MG RE SUPP: 650.0000 mg | RECTAL | Status: DC | PRN

## 2021-10-28 MED ORDER — ROPINIROLE HCL 1 MG PO TABS
0.5000 mg | ORAL_TABLET | Freq: Every day | ORAL | Status: DC
  Administered 2021-10-28 – 2021-11-09 (×13): 0.5 mg via ORAL
  Filled 2021-10-28 (×14): qty 1

## 2021-10-28 MED ORDER — CLEVIDIPINE BUTYRATE 0.5 MG/ML IV EMUL: 0.0000 mg/h | INTRAVENOUS | Status: DC

## 2021-10-28 MED ORDER — INSULIN ASPART 100 UNIT/ML IJ SOLN: 0.0000 [IU] | Freq: Every day | INTRAMUSCULAR | Status: DC

## 2021-10-28 MED ORDER — ATORVASTATIN CALCIUM 40 MG PO TABS
40.0000 mg | ORAL_TABLET | Freq: Every evening | ORAL | Status: DC
  Administered 2021-10-28 – 2021-11-09 (×13): 40 mg via ORAL
  Filled 2021-10-28 (×13): qty 1

## 2021-10-28 MED ORDER — IOHEXOL 350 MG/ML SOLN
100.0000 mL | Freq: Once | INTRAVENOUS | Status: AC | PRN
  Administered 2021-10-28: 100 mL via INTRAVENOUS

## 2021-10-28 MED ORDER — ORAL CARE MOUTH RINSE
15.0000 mL | OROMUCOSAL | Status: DC
  Administered 2021-10-28 – 2021-11-09 (×45): 15 mL via OROMUCOSAL

## 2021-10-28 MED ORDER — SODIUM CHLORIDE 0.9 % IV BOLUS
1000.0000 mL | Freq: Once | INTRAVENOUS | Status: AC
  Administered 2021-10-28: 1000 mL via INTRAVENOUS

## 2021-10-28 MED ORDER — SODIUM CHLORIDE 0.9 % IV SOLN
200.0000 mg | Freq: Once | INTRAVENOUS | Status: AC
  Administered 2021-10-28: 200 mg via INTRAVENOUS
  Filled 2021-10-28: qty 20

## 2021-10-28 MED ORDER — LORAZEPAM 2 MG/ML IJ SOLN
INTRAMUSCULAR | Status: AC
  Administered 2021-10-28: 2 mg
  Filled 2021-10-28: qty 1

## 2021-10-28 MED ORDER — FAMOTIDINE 20 MG PO TABS
20.0000 mg | ORAL_TABLET | Freq: Every day | ORAL | Status: DC
  Administered 2021-10-29 – 2021-11-09 (×12): 20 mg via ORAL
  Filled 2021-10-28 (×12): qty 1

## 2021-10-28 MED ORDER — INSULIN ASPART 100 UNIT/ML IJ SOLN
10.0000 [IU] | Freq: Once | INTRAMUSCULAR | Status: AC
  Administered 2021-10-28: 10 [IU] via SUBCUTANEOUS

## 2021-10-28 MED ORDER — QUETIAPINE FUMARATE 25 MG PO TABS
25.0000 mg | ORAL_TABLET | Freq: Every day | ORAL | Status: DC
  Administered 2021-10-28 – 2021-11-09 (×13): 25 mg via ORAL
  Filled 2021-10-28 (×13): qty 1

## 2021-10-28 MED ORDER — ACETAMINOPHEN 325 MG PO TABS
650.0000 mg | ORAL_TABLET | ORAL | Status: DC | PRN
  Administered 2021-11-02 – 2021-11-09 (×4): 650 mg via ORAL
  Filled 2021-10-28 (×5): qty 2

## 2021-10-28 MED ORDER — ZINC SULFATE 220 (50 ZN) MG PO CAPS
220.0000 mg | ORAL_CAPSULE | Freq: Every day | ORAL | Status: DC
  Administered 2021-10-29 – 2021-11-09 (×12): 220 mg via ORAL
  Filled 2021-10-28 (×12): qty 1

## 2021-10-28 MED ORDER — MIRTAZAPINE 15 MG PO TABS
15.0000 mg | ORAL_TABLET | Freq: Every day | ORAL | Status: DC
  Administered 2021-10-28 – 2021-11-09 (×13): 15 mg via ORAL
  Filled 2021-10-28 (×13): qty 1

## 2021-10-28 MED ORDER — PANTOPRAZOLE SODIUM 40 MG IV SOLR
40.0000 mg | Freq: Every day | INTRAVENOUS | Status: DC
  Administered 2021-10-28: 40 mg via INTRAVENOUS
  Filled 2021-10-28: qty 10

## 2021-10-28 MED ORDER — SODIUM CHLORIDE 0.9% FLUSH
3.0000 mL | Freq: Once | INTRAVENOUS | Status: AC
  Administered 2021-10-28: 3 mL via INTRAVENOUS

## 2021-10-28 MED ORDER — SENNOSIDES-DOCUSATE SODIUM 8.6-50 MG PO TABS
1.0000 | ORAL_TABLET | Freq: Every evening | ORAL | Status: DC | PRN
  Administered 2021-11-06: 1 via ORAL
  Filled 2021-10-28: qty 1

## 2021-10-28 MED ORDER — TENECTEPLASE FOR STROKE
0.2500 mg/kg | PACK | Freq: Once | INTRAVENOUS | Status: AC
  Administered 2021-10-28: 14 mg via INTRAVENOUS
  Filled 2021-10-28: qty 10

## 2021-10-28 MED ORDER — INSULIN ASPART 100 UNIT/ML IJ SOLN
5.0000 [IU] | Freq: Three times a day (TID) | INTRAMUSCULAR | Status: DC
  Administered 2021-10-29 – 2021-10-30 (×3): 5 [IU] via SUBCUTANEOUS

## 2021-10-28 MED ORDER — INSULIN ASPART 100 UNIT/ML IJ SOLN: 0.0000 [IU] | Freq: Three times a day (TID) | INTRAMUSCULAR | Status: DC

## 2021-10-28 MED ORDER — CHLORHEXIDINE GLUCONATE CLOTH 2 % EX PADS
6.0000 | MEDICATED_PAD | Freq: Every day | CUTANEOUS | Status: DC
  Administered 2021-10-28 – 2021-11-09 (×13): 6 via TOPICAL

## 2021-10-28 MED ORDER — ACETAMINOPHEN 160 MG/5ML PO SOLN: 650.0000 mg | ORAL | Status: DC | PRN

## 2021-10-28 NOTE — H&P (Signed)
NEUROLOGY H&P   Date of service: October 28, 2021 Patient Name: Brittany [name] MRN:  [id number] DOB:  17-Jun-1960 Reason for consult: stroke code Requesting physician: Dr. Lajean Saver _ _ _   _ __   _ __ _ _  __ __   _ __   __ _  History of Present Illness   This is a 61 year old woman with past medical history significant for diabetes, hypertension, lupus, prior PCA stroke who presented with acute onset of aphasia.  She is not on anticoagulation.  I spoke with the facility who reported that she was with staff members when her symptoms began at approximately 930.  At baseline she has some mild right-sided weakness and some intermittent mild dysarthria but no aphasia.  She became acutely aphasic and was unable to follow commands and was only able to say the word" yes." NIHSS = 18. CT head NAICP.  I confirmed with patient's daughter Brittany [name] by phone that she met inclusion criteria and did not meet any exclusion criteria for TNK.  Benefits, risks, and alternatives were discussed and she gave informed consent to proceed with TNK.  CTA was performed that did not show any new large vessel occlusion, therefore patient was not taken to IR.  Patient was hyperglycemic with blood sugar of 450 with EMS which normalized after receiving insulin and fluids in the ED. She has a history of seizure but does not have any AEDs on her current med list. She has previously been on VPA and keppra but it is unclear why these were discontinued.  CNS imaging was personally reviewed and discussed by phone with radiology    ROS   UTA 2/2 aphasia  Past History   I have reviewed the following:  Past Medical History:  Diagnosis Date   C. 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   PFTs on 06/15/15 with FEV1/FVC ratio of 64%, FEV1 83%, DLCO 47%   Diabetes mellitus without complication ([place])    Dyslipidemia     History of Clostridium difficile colitis 03/03/2021   Hyperosmolar hyperglycemic state (HHS) ([place]) 11/18/2020   Hypertension    Hypokalemia; hyperkalemia 07/12/2019   Lactic acidosis 11/19/2020   Lupus (HCC)    Pancreatic cancer (HCC)    RLS (restless legs syndrome)    Seizure disorder (HCC)    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[place]) 07/17/2019   Past Surgical History:  Procedure Laterality Date   COLECTOMY WITH COLOSTOMY CREATION/HARTMANN PROCEDURE N/A 03/10/2021   Procedure: COLECTOMY WITH COLOSTOMY CREATION/HARTMANN PROCEDURE;  Surgeon: Jules Husbands, MD;  Location: [facility] ORS;  Service: General;  Laterality: N/A;   PANCREATECTOMY     spleenectomy     Family History  Problem Relation Age of Onset   Anxiety disorder Sister    Breast cancer Maternal Aunt    Social History   Socioeconomic History   Marital status: Widowed    Spouse name: Not on file   Number of children: Not on file   Years of education: Not on file   Highest education level: Not on file  Occupational History   Not on file  Tobacco Use   Smoking status: Every Day    Types: Cigarettes    Passive exposure: Past   Smokeless tobacco: Never  Substance and Sexual Activity   Alcohol use: Not Currently   Drug use: Yes  Types: Marijuana   Sexual activity: Not Currently  Other Topics Concern   Not on file  Social History Narrative   Not on file   Social Determinants of Health   Financial Resource Strain: Not on file  Food Insecurity: Not on file  Transportation Needs: Not on file  Physical Activity: Not on file  Stress: Not on file  Social Connections: Not on file   Allergies  Allergen Reactions   Cephalosporins Itching    TOLERATED ZOSYN (PIPERACILLIN) BEFORE   Hydromorphone Itching   Keflin [Cephalothin] Itching   Lactose Intolerance (Gi) Diarrhea    Medications   Medications Prior to Admission  Medication Sig Dispense Refill Last Dose   aspirin 81 MG EC tablet Take 1 tablet  (81 mg total) by mouth daily. Swallow whole. 30 tablet 11 10/28/2021   atorvastatin (LIPITOR) 40 MG tablet Take 1 tablet (40 mg total) by mouth Nightly. (Patient taking differently: Take 40 mg by mouth at bedtime.) 30 tablet 1 10/27/2021   diltiazem (CARDIZEM CD) 120 MG 24 hr capsule Take 1 capsule (120 mg total) by mouth daily.   10/28/2021 at 0800   famotidine (PEPCID) 20 MG tablet Take 1 tablet (20 mg total) by mouth daily. 30 tablet 0 10/28/2021   insulin aspart (NOVOLOG) 100 UNIT/ML injection Inject 5 Units into the skin 3 (three) times daily with meals. If eats more than 5)% of meals (Patient taking differently: Inject 5 Units into the skin 3 (three) times daily with meals.) 10 mL 11 10/27/2021   insulin detemir (LEVEMIR) 100 UNIT/ML injection Inject 0.08 mLs (8 Units total) into the skin 2 (two) times daily. (Patient taking differently: Inject 11 Units into the skin at bedtime.) 10 mL 11 10/27/2021   melatonin 5 MG TABS Take 0.5 tablets (2.5 mg total) by mouth at bedtime.  0 10/27/2021   mirtazapine (REMERON) 15 MG tablet Take 15 mg by mouth at bedtime.   10/27/2021   Multiple Vitamin (MULTIVITAMIN WITH MINERALS) TABS tablet Take 1 tablet by mouth daily. 30 tablet 0 10/28/2021   QUEtiapine (SEROQUEL) 25 MG tablet Take 1 tablet (25 mg total) by mouth at bedtime.   10/27/2021   rOPINIRole (REQUIP) 0.5 MG tablet Take 0.5 mg by mouth at bedtime.   10/27/2021   zinc sulfate 220 (50 Zn) MG capsule Take 1 capsule (220 mg total) by mouth daily.   10/28/2021   feeding supplement, GLUCERNA SHAKE, (GLUCERNA SHAKE) LIQD Take 237 mLs by mouth 3 (three) times daily between meals. 30 mL 0    insulin lispro (HUMALOG) 100 UNIT/ML injection 0-9 units TID with meals, and 0-5 units QHS bases on sliding scale previously prescribed to patient. (Patient not taking: Reported on 10/21/2021) 10 mL 11 Not Taking   nicotine (NICODERM CQ - DOSED IN MG/24 HOURS) 21 mg/24hr patch Place 1 patch (21 mg total) onto the skin daily as needed  (nicotine craving). 28 patch 0       Current Facility-Administered Medications:    [START ON 10/29/2021]  stroke: early stages of recovery book, , Does not apply, Once, ,  M, MD   0.9 %  sodium chloride infusion, , Intravenous, Once, ,  M, MD, Last Rate: 100 mL/hr at 10/28/21 1641, Infusion Verify at 10/28/21 1641   acetaminophen (TYLENOL) tablet 650 mg, 650 mg, Oral, Q4H PRN **OR** acetaminophen (TYLENOL) 160 MG/5ML solution 650 mg, 650 mg, Per Tube, Q4H PRN **OR** acetaminophen (TYLENOL) suppository 650 mg, 650 mg, Rectal, Q4H PRN, ,  M, MD     atorvastatin (LIPITOR) tablet 40 mg, 40 mg, Oral, Nightly, Quinn Axe, Patrecia Pour, MD   Chlorhexidine Gluconate Cloth 2 % PADS 6 each, 6 each, Topical, Daily, Derek Jack, MD, 6 each at 10/28/21 1649   clevidipine (CLEVIPREX) infusion 0.5 mg/mL, 0-21 mg/hr, Intravenous, Continuous, Derek Jack, MD, Held at 10/28/21 1618   [START ON 10/29/2021] famotidine (PEPCID) tablet 20 mg, 20 mg, Oral, Daily, Derek Jack, MD   [START ON 10/29/2021] insulin aspart (novoLOG) injection 0-15 Units, 0-15 Units, Subcutaneous, TID WC, Derek Jack, MD   insulin aspart (novoLOG) injection 0-5 Units, 0-5 Units, Subcutaneous, QHS, Derek Jack, MD   [START ON 10/29/2021] insulin aspart (novoLOG) injection 5 Units, 5 Units, Subcutaneous, TID WC, Derek Jack, MD   insulin detemir (LEVEMIR) injection 11 Units, 11 Units, Subcutaneous, QHS, Derek Jack, MD   mirtazapine (REMERON) tablet 15 mg, 15 mg, Oral, QHS, Derek Jack, MD   Oral care mouth rinse, 15 mL, Mouth Rinse, 4 times per day, Derek Jack, MD   Oral care mouth rinse, 15 mL, Mouth Rinse, PRN, Derek Jack, MD   pantoprazole (PROTONIX) injection 40 mg, 40 mg, Intravenous, QHS, Su Monks M, MD   QUEtiapine (SEROQUEL) tablet 25 mg, 25 mg, Oral, QHS, Pasqualino Witherspoon M, MD   rOPINIRole (REQUIP) tablet 0.5 mg, 0.5 mg, Oral, QHS, Derek Jack, MD    senna-docusate (Senokot-S) tablet 1 tablet, 1 tablet, Oral, QHS PRN, Derek Jack, MD   [START ON 10/29/2021] zinc sulfate capsule 220 mg, 220 mg, Oral, Daily, Derek Jack, MD  Vitals   Vitals:   10/28/21 1615 10/28/21 1630 10/28/21 1700 10/28/21 1730  BP: 100/82 116/69 (!) 126/90 (!) 141/103  Pulse: 97  79 76  Resp: _0 Temp:  97.6 F (36.4 C)    TempSrc:  Oral    SpO2: 100% 96% 98% (!) 86%  Weight:         Body mass index is 24.2 kg/m.  Physical Exam   Physical Exam Gen: alert, unable to answer orientation questions, unable to follow commands HEENT: Atraumatic, normocephalic;mucous membranes moist; oropharynx clear, tongue without atrophy or fasciculations. Neck: Supple, trachea midline. Resp: CTAB, no w/r/r CV: RRR, no m/g/r; nml S1 and S2. 2+ symmetric peripheral pulses. Abd: soft/NT/ND; nabs x 4 quad Extrem: Nml bulk; no cyanosis, clubbing, or edema.  Neuro: *MS: alert, unable to answer orientation questions, unable to follow commands *Speech: severe dysarthria, severe expressive and receptive aphasia *CN: PERRL, blinks to threat bilat, EOMI, R UMN facial droop, hearing intact to voice *Motor: LUE and LLE drift but not to bed. RUE drift to bed, RLE no movement against gravity. *Sensory: SILT.  *Coordination:  UTA *Reflexes:  1+ and symmetric throughout without clonus; toes equiv bilat *Gait: deferred  NIHSS  1a Level of Conscious.: 0 1b LOC Questions: 2 1c LOC Commands: 2 2 Best Gaze: 0 3 Visual: 0 4 Facial Palsy: 2 5a Motor Arm - left: 1 5b Motor Arm - Right: 2 6a Motor Leg - Left: 1 6b Motor Leg - Right: 3 7 Limb Ataxia: 0 8 Sensory: 0 9 Best Language: 3 10 Dysarthria: 2 11 Extinct. and Inatten.: 0  TOTAL: 18   Premorbid mRS = 2-3   Labs   CBC:  Recent Labs  Lab 10/22/21 0135 10/28/21 1140 10/28/21 1145 10/28/21 1311  WBC 6.7 6.7  --   --   NEUTROABS  --  3.3  --   --  HGB 10.6* 11.1* 13.3 12.6  HCT 34.5* 35.4* 39.0  37.0  MCV 94.5 94.9  --   --   PLT 436* 419*  --   --     Basic Metabolic Panel:  Lab Results  Component Value Date   NA 127 (L) 10/28/2021   K 4.5 10/28/2021   CO2 14 (L) 10/28/2021   GLUCOSE 561 (HH) 10/28/2021   BUN 20 10/28/2021   CREATININE 0.60 10/28/2021   CALCIUM 8.4 (L) 10/28/2021   GFRNONAA >60 10/28/2021   GFRAA >60 11/02/2019   Lipid Panel:  Lab Results  Component Value Date   LDLCALC 107 (H) 11/05/2020   HgbA1c:  Lab Results  Component Value Date   HGBA1C 15.0 (H) 09/04/2021   Urine Drug Screen:     Component Value Date/Time   LABOPIA NONE DETECTED 08/13/2021 1717   COCAINSCRNUR NONE DETECTED 08/13/2021 1717   LABBENZ NONE DETECTED 08/13/2021 1717   AMPHETMU NONE DETECTED 08/13/2021 1717   THCU NONE DETECTED 08/13/2021 1717   LABBARB NONE DETECTED 08/13/2021 1717    Alcohol Level     Component Value Date/Time   ETH <10 10/28/2021 1140    Impression   This is a 60-year-old woman with past medical history significant for diabetes, hypertension, lupus, prior PCA stroke with subsequent seizures who presented with acute onset of aphasia c/f acute ischemic stroke, now s/p TNK. There was also concern for seizure on presentation but she did not improve with ativan and rEEG while still aphasic was unremarkable. BG 450 with EMS, normalized after insulin and fluids in ED.  Recommendations   - Admit to ICU under Dr.  - Neurochecks and NIHSS documentation per post-tNK protocol - STAT head CT for any change in neurologic exam - Non-con head CT 24 hrs post-tNK r/o hemorrhagic conversion - MRI brain wo contrast - CTA/MRA if not already performed - TTE - no aspirin for 24 hours post IV t-PA and until ICH ruled out by a head CT - keep SBP less than 180/105 for the 1st 24 hours post IV t-NK to reduce the risk of hemorrhagic transformation - SCDs for DVT prophylaxis; can start SQ Heparin if head CT 24 hours post IV tPA is negative for ICH - NPO; swallow  study - PT/OT and speech therapy - stroke education - outpatient f/u with neurology after discharge - rEEG while aphasic showed no e/o seizure activity - When family is available or patient able to provide history please clarify what if any AEDs she was on prior to admission. She has been on keppra and depakote in the past and it is unclear why these were discontinued. Patient loaded on vimpat 200mg IV once to be f/b 50mg q 12 hrs - Continue home insulin regimen - Accuchecks achs - Stroke team will assume care tomorrow ______________________________________________________________________   Thank you for the opportunity to take part in the care of this patient. If you have any further questions, please contact the neurology consultation attending.  Signed,   , MD [facility] [phone]  If 7pm- 7am, please page neurology on call as listed in [place].  

## 2021-10-28 NOTE — Progress Notes (Signed)
PT Cancellation Note  Patient Details Name: Brittany [name] MRN: [id number] DOB: 1960/12/29   Cancelled Treatment:    Reason Eval/Treat Not Completed: Active bedrest order. Will plan to follow-up tomorrow as able.   Moishe Spice, PT, DPT Acute Rehabilitation Services  Office: [place] 10/28/2021, 5:30 PM

## 2021-10-28 NOTE — ED Notes (Signed)
Pt colostomy bag replaced

## 2021-10-28 NOTE — Procedures (Signed)
History: Concern for seizure  Sedation: none  Technique: This EEG was acquired with electrodes placed according to the International 10-20 electrode system (including Fp1, Fp2, F3, F4, C3, C4, P3, P4, O1, O2, T3, T4, T5, T6, A1, A2, Fz, Cz, Pz). The following electrodes were missing or displaced: none.   Background: The background consists of intermixed alpha and beta activities. There is a well defined posterior dominant rhythm of 9 Hz that is better seen on the right than left. Sleep is not recorded.   Photic stimulation: Physiologic driving is not performed  EEG Abnormalities: 1) Assymetric PDR  Clinical Interpretation: This EEG recorded evidence of a left posterior cortical dysfunction consistent with the patient's know parieto-occipital infarct. There was no seizure or seizure predisposition recorded on this study. Please note that lack of epileptiform activity on EEG does not preclude the possibility of epilepsy.   Roland Rack, MD [facility] [phone]  If 7pm- 7am, please page neurology on call as listed in [place].

## 2021-10-28 NOTE — ED Triage Notes (Signed)
Patient was bib [facility] from [facility] assisted living with complaints of rt arm weakness, right sided facial droop and aphasia. Per facility pts last known well was 1000 this morning and pt started showing signs of stroke at 1030. Pts speech is slurred and answering questions inappropriately. Facility states BG was 555.  Code stroke called

## 2021-10-28 NOTE — Progress Notes (Signed)
PHARMACIST CODE STROKE RESPONSE  Notified to mix TNK at 1158 by Dr. Quinn Axe TNK preparation completed at 1159  TNK dose = 14 mg IV over 5 seconds.   Issues/delays encountered (if applicable):   Brittany [name] 10/28/21 12:07 PM

## 2021-10-28 NOTE — ED Provider Notes (Signed)
[place] EMERGENCY DEPARTMENT Provider Note   CSN: [id number] Arrival date & time: 10/28/21  1131  An emergency department physician performed an initial assessment on this suspected stroke patient at 1132.  History  Chief Complaint  Patient presents with   Code Stroke    Brittany [name] is a 61 y.o. female.  HPI Patient is a 61 year old female brought into the emergency department by EMS for stroke activation for right sided weakness last known normal 10 AM this morning.  No known seizure history.  No history of stroke.  Patient is a diabetic and CBG was greater than 500.  Airway intact at this time. Pt seems somewhat confused. Neurology examining pt at this time. 11:36 AM   With facility [facility] who confirmed onset of her symptoms they had talked to neurology earlier today.  Seems that her symptoms were sudden onset and they did notice some right facial droop prior to transport which was not present when she arrived in the emergency room.  Patient continues to be aphasic. She follows commands however.  Level 5 caveat due to altered mental status/nonverbal status/aphasia      Home Medications Prior to Admission medications   Medication Sig Start Date End Date Taking? Authorizing Provider  aspirin 81 MG EC tablet Take 1 tablet (81 mg total) by mouth daily. Swallow whole. 05/23/21  Yes Pokhrel, Laxman, MD  atorvastatin (LIPITOR) 40 MG tablet Take 1 tablet (40 mg total) by mouth Nightly. Patient taking differently: Take 40 mg by mouth at bedtime. 03/14/21 10/28/21 Yes Fritzi Mandes, MD  diltiazem (CARDIZEM CD) 120 MG 24 hr capsule Take 1 capsule (120 mg total) by mouth daily. 10/17/21  Yes Shelly Coss, MD  famotidine (PEPCID) 20 MG tablet Take 1 tablet (20 mg total) by mouth daily. 08/25/21  Yes Fritzi Mandes, MD  insulin aspart (NOVOLOG) 100 UNIT/ML injection Inject 5 Units into the skin 3 (three) times daily with meals. If eats more than 5)% of  meals Patient taking differently: Inject 5 Units into the skin 3 (three) times daily with meals. 10/16/21  Yes Adhikari, Tamsen Meek, MD  insulin detemir (LEVEMIR) 100 UNIT/ML injection Inject 0.08 mLs (8 Units total) into the skin 2 (two) times daily. Patient taking differently: Inject 11 Units into the skin at bedtime. 10/23/21  Yes Lorella Nimrod, MD  melatonin 5 MG TABS Take 0.5 tablets (2.5 mg total) by mouth at bedtime. 10/16/21  Yes Shelly Coss, MD  mirtazapine (REMERON) 15 MG tablet Take 15 mg by mouth at bedtime.   Yes [provider]  Multiple Vitamin (MULTIVITAMIN WITH MINERALS) TABS tablet Take 1 tablet by mouth daily. 08/25/21  Yes Fritzi Mandes, MD  QUEtiapine (SEROQUEL) 25 MG tablet Take 1 tablet (25 mg total) by mouth at bedtime. 10/16/21  Yes Shelly Coss, MD  rOPINIRole (REQUIP) 0.5 MG tablet Take 0.5 mg by mouth at bedtime. 06/29/21  Yes [provider]  zinc sulfate 220 (50 Zn) MG capsule Take 1 capsule (220 mg total) by mouth daily. 10/17/21  Yes Shelly Coss, MD  feeding supplement, GLUCERNA SHAKE, (GLUCERNA SHAKE) LIQD Take 237 mLs by mouth 3 (three) times daily between meals. 08/24/21   Fritzi Mandes, MD  insulin lispro (HUMALOG) 100 UNIT/ML injection 0-9 units TID with meals, and 0-5 units QHS bases on sliding scale previously prescribed to patient. Patient not taking: Reported on 10/21/2021 08/31/21 08/31/22  Cherene Altes, MD  nicotine (NICODERM CQ - DOSED IN MG/24 HOURS) 21 mg/24hr patch Place  1 patch (21 mg total) onto the skin daily as needed (nicotine craving). 10/16/21   Shelly Coss, MD      Allergies    Cephalosporins, Hydromorphone, Keflin [cephalothin], and Lactose intolerance (gi)    Review of Systems   Review of Systems  Physical Exam Updated Vital Signs BP 119/82   Pulse 80   Temp 98.2 F (36.8 C)   Resp 20   Wt 56.2 kg   SpO2 99%   BMI 24.20 kg/m  Physical Exam Vitals and nursing note reviewed.  Constitutional:      General: She is  not in acute distress. HENT:     Head: Normocephalic and atraumatic.     Nose: Nose normal.     Mouth/Throat:     Mouth: Mucous membranes are moist.  Eyes:     General: No scleral icterus. Cardiovascular:     Rate and Rhythm: Normal rate and regular rhythm.     Pulses: Normal pulses.     Heart sounds: Normal heart sounds.  Pulmonary:     Effort: Pulmonary effort is normal. No respiratory distress.     Breath sounds: No wheezing.  Abdominal:     Palpations: Abdomen is soft.     Tenderness: There is no abdominal tenderness.  Musculoskeletal:     Cervical back: Normal range of motion.     Right lower leg: No edema.     Left lower leg: No edema.  Skin:    General: Skin is warm and dry.     Capillary Refill: Capillary refill takes less than 2 seconds.     Comments: Skin exam unremarkable  Neurological:     Mental Status: She is alert.     Comments: Right upper and right lower extremity weakness Aphasia  Neurology conducting neurologic exam at this time.      ED Results / Procedures / Treatments   Labs (all labs ordered are listed, but only abnormal results are displayed) Labs Reviewed  CBC - Abnormal; Notable for the following components:      Result Value   RBC 3.73 (*)    Hemoglobin 11.1 (*)    HCT 35.4 (*)    RDW 15.8 (*)    Platelets 419 (*)    All other components within normal limits  COMPREHENSIVE METABOLIC PANEL - Abnormal; Notable for the following components:   Sodium 126 (*)    CO2 14 (*)    Glucose, Bld 545 (*)    Calcium 8.4 (*)    Total Protein 8.2 (*)    Albumin 3.1 (*)    AST 45 (*)    ALT 50 (*)    Alkaline Phosphatase 190 (*)    All other components within normal limits  URINALYSIS, ROUTINE W REFLEX MICROSCOPIC - Abnormal; Notable for the following components:   Color, Urine STRAW (*)    Glucose, UA >=500 (*)    Bacteria, UA RARE (*)    All other components within normal limits  I-STAT CHEM 8, ED - Abnormal; Notable for the following  components:   Sodium 127 (*)    Glucose, Bld 561 (*)    Calcium, Ion 1.00 (*)    TCO2 18 (*)    All other components within normal limits  CBG MONITORING, ED - Abnormal; Notable for the following components:   Glucose-Capillary 472 (*)    All other components within normal limits  I-STAT VENOUS BLOOD GAS, ED - Abnormal; Notable for the following components:   pH, Ven  7.446 (*)    pCO2, Ven 27.2 (*)    pO2, Ven 151 (*)    Bicarbonate 18.7 (*)    TCO2 20 (*)    Acid-base deficit 4.0 (*)    Sodium 127 (*)    Calcium, Ion 0.99 (*)    All other components within normal limits  DIFFERENTIAL  ETHANOL  PROTIME-INR  APTT  CBG MONITORING, ED    EKG EKG Interpretation  Date/Time:  Saturday October 28 2021 12:42:22 EDT Ventricular Rate:  82 PR Interval:  140 QRS Duration: 87 QT Interval:  397 QTC Calculation: 464 R Axis:   92 Text Interpretation: Sinus rhythm Right axis deviation Confirmed by Lajean Saver [phone]) on 10/28/2021 12:45:35 PM  Radiology DG Chest Portable 1 View  Result Date: 10/28/2021 CLINICAL DATA:  Altered level of consciousness EXAM: PORTABLE CHEST 1 VIEW COMPARISON:  09/21/2021 FINDINGS: Single frontal view of the chest demonstrates a stable cardiac silhouette. Chronic bibasilar interstitial prominence and linear areas of consolidation, right greater than left. No new airspace disease, effusion, or pneumothorax. No acute bony abnormalities. IMPRESSION: 1. Chronic bibasilar interstitial prominence with scattered bibasilar consolidation, which has been stable since the 06/25/2021 exam. Findings are likely sequela of postinflammatory scarring or chronic aspiration pneumonia. 2. Otherwise unremarkable chest x-ray. Electronically Signed   By: Randa Ngo M.D.   On: 10/28/2021 15:02   CT ANGIO HEAD NECK W WO CM W PERF (CODE STROKE)  Result Date: 10/28/2021 CLINICAL DATA:  61 year old female code stroke presentation. Chronic infarcts. EXAM: CT ANGIOGRAPHY HEAD AND  NECK TECHNIQUE: Multidetector CT imaging of the head and neck was performed using the standard protocol during bolus administration of intravenous contrast. Multiplanar CT image reconstructions and MIPs were obtained to evaluate the vascular anatomy. Carotid stenosis measurements (when applicable) are obtained utilizing NASCET criteria, using the distal internal carotid diameter as the denominator. CT Cerebral Perfusion also performed, with planned analysis by RAPID software, but the study is nondiagnostic due to patient motion. RADIATION DOSE REDUCTION: This exam was performed according to the departmental dose-optimization program which includes automated exposure control, adjustment of the mA and/or kV according to patient size and/or use of iterative reconstruction technique. CONTRAST:  161m OMNIPAQUE IOHEXOL 350 MG/ML SOLN COMPARISON:  Plain head CT 1148 hours today. FINDINGS: CT Perfusion: Nondiagnostic due to pronounced patient motion. CTA NECK Skeleton: Cervical spine degeneration. No acute osseous abnormality identified. Upper chest: Centrilobular emphysema. Mild upper lung atelectasis. No superior mediastinal lymphadenopathy. Other neck: No neck mass or lymphadenopathy identified. Aortic arch: Calcified aortic atherosclerosis. Aberrant origin of the right subclavian artery, 4 vessel arch configuration. Right carotid system: Mildly tortuous proximal right CCA. Right CCA and right ICA intermittent plaque with no significant stenosis to the skull base. Left carotid system: Mildly tortuous proximal left CCA. Less pronounced left carotid plaque in the neck with no stenosis to the skull base. Vertebral arteries: Aberrant origin proximal right subclavian artery with atherosclerosis but no significant stenosis. Right vertebral artery origin is normal. Right vertebral artery is non dominant and diminutive but patent to the skull base with no focal stenosis identified. Proximal left subclavian artery soft and  calcified plaque without significant stenosis. Calcified plaque at the left vertebral artery origin but no significant stenosis. Tortuous left V1 segment. Dominant left vertebral artery is patent to the skull base with only mild stenosis. CTA HEAD Posterior circulation: Dominant left V4 segment. Patent PICA origins and no distal vertebral or vertebrobasilar junction stenosis. Patent basilar artery without stenosis. Patent SCA and  PCA origins. Right posterior communicating artery is present. Smaller left posterior communicating artery is present. There is up to moderate irregularity of the left PCA with attenuated distal PCA branches, chronic infarct in that vascular territory. Right PCA branches are patent with only mild irregularity. Anterior circulation: Both ICA siphons are patent. Mild siphon plaque, no siphon stenosis. Normal posterior communicating artery origins. Patent carotid termini, MCA and ACA origins. Tortuous A1 segments. Anterior communicating artery and bilateral ACA branches are within normal limits. Right MCA M1 segment and bifurcation are patent with only mild tortuosity and irregularity. No right MCA branch occlusion. Left MCA M1 segment and bifurcation are patent with only mild irregularity. No left MCA branch occlusion is identified. Venous sinuses: Early contrast timing, not well evaluated. Anatomic variants: Dominant left vertebral artery. Aberrant origin of the right subclavian artery. Fetal type right PCA origin. Review of the MIP images confirms the above findings IMPRESSION: 1. Negative for emergent large vessel occlusion. Nondiagnostic CT Perfusion due to patient motion. 2. Irregular and attenuated Left PCA branches compatible with chronic infarct in that territory. 3. Otherwise generally mild for age atherosclerosis in the head and neck. No other significant arterial stenosis identified. 4. More advanced Aortic Atherosclerosis (ICD10-I70.0). 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 5. Emphysema (ICD10-J43.9). Preliminary CTA results were discussed by telephone with Neurology Dr. Quinn Axe on 10/28/2021 at 1214 hours. Electronically Signed   By: Genevie Ann M.D.   On: 10/28/2021 12:35   CT HEAD CODE STROKE WO CONTRAST  Result Date: 10/28/2021 CLINICAL DATA:  Code stroke. 61 year old female with history of multiple prior infarcts. New aphasia. Possible seizure activity. EXAM: CT HEAD WITHOUT CONTRAST TECHNIQUE: Contiguous axial images were obtained from the base of the skull through the vertex without intravenous contrast. RADIATION DOSE REDUCTION: This exam was performed according to the departmental dose-optimization program which includes automated exposure control, adjustment of the mA and/or kV according to patient size and/or use of iterative reconstruction technique. COMPARISON:  Head CT 09/21/2021. FINDINGS: Brain: Multifocal chronic infarcts and encephalomalacia appears stable by CT since 09/21/2021. This includes involvement in the anterior right MCA territory, extensive left temporal and occipital lobe encephalomalacia, extensive left cerebellar encephalomalacia. Additional confluent bilateral cerebral white matter hypodensity including deep white matter capsule involvement. No acute intracranial hemorrhage identified. No midline shift, mass effect, or evidence of intracranial mass lesion. No ventriculomegaly. No cortically based acute infarct identified. Vascular: Calcified atherosclerosis at the skull base. No suspicious intracranial vascular hyperdensity. Skull: No acute osseous abnormality identified. Sinuses/Orbits: Visualized paranasal sinuses and mastoids are stable and well aerated. Other: No acute orbit or scalp soft tissue finding. ASPECTS [facility] Stroke Program Early CT Score) Total score (0-10 with 10 being normal): 10, chronic encephalomalacia. IMPRESSION: 1. Stable CT appearance of advanced ischemic disease since last month. 2. No acute cortically based  infarct or acute intracranial hemorrhage identified. ASPECTS 10. 3. Study discussed by telephone with Dr. Quinn Axe on 10/28/2021 at 1155 hours. Electronically Signed   By: Genevie Ann M.D.   On: 10/28/2021 11:58    Procedures .Critical Care  Performed by: Tedd Sias, PA Authorized by: Tedd Sias, PA   Critical care provider statement:    Critical care time (minutes):  35   Critical care time was exclusive of:  Separately billable procedures and treating other patients and teaching time   Critical care was necessary to treat or prevent imminent or life-threatening deterioration of the following conditions: stroke.   Critical care was time  spent personally by me on the following activities:  Development of treatment plan with patient or surrogate, review of old charts, re-evaluation of patient's condition, pulse oximetry, ordering and review of radiographic studies, ordering and review of laboratory studies, ordering and performing treatments and interventions, obtaining history from patient or surrogate, examination of patient and evaluation of patient's response to treatment   Care discussed with: admitting provider       Medications Ordered in ED Medications  0.9 %  sodium chloride infusion ( Intravenous New Bag/Given 10/28/21 1351)   stroke: early stages of recovery book (has no administration in time range)  0.9 %  sodium chloride infusion (has no administration in time range)  acetaminophen (TYLENOL) tablet 650 mg (has no administration in time range)    Or  acetaminophen (TYLENOL) 160 MG/5ML solution 650 mg (has no administration in time range)    Or  acetaminophen (TYLENOL) suppository 650 mg (has no administration in time range)  senna-docusate (Senokot-S) tablet 1 tablet (has no administration in time range)  pantoprazole (PROTONIX) injection 40 mg (has no administration in time range)  clevidipine (CLEVIPREX) infusion 0.5 mg/mL (has no administration in time range)  sodium  chloride flush (NS) 0.9 % injection 3 mL (3 mLs Intravenous Given 10/28/21 1236)  LORazepam (ATIVAN) 2 MG/ML injection (2 mg  Given 10/28/21 1143)  tenecteplase (TNKASE) injection for Stroke 14 mg (14 mg Intravenous Given 10/28/21 1200)  iohexol (OMNIPAQUE) 350 MG/ML injection 100 mL (100 mLs Intravenous Contrast Given 10/28/21 1219)  insulin aspart (novoLOG) injection 10 Units (10 Units Subcutaneous Given 10/28/21 1350)    ED Course/ Medical Decision Making/ A&P Clinical Course as of 10/28/21 1520  Sat Oct 28, 2021  1136 Patient is a 61 year old female brought into the emergency department by EMS for stroke activation for right sided weakness last known normal 10 AM this morning.  No known seizure history.  No history of stroke.  Patient is a diabetic and CBG was greater than 500.  Airway intact at this time. Pt seems somewhat confused. Neurology examining pt at this time.  [WF]  1211 Re-eval : in [place] w neurology. Aphasia and R sided weakness. No improvement w ativan. Neuro is administering TNK.  [WF]  1242 Sodium(!): 126 Corrects to 133 [WF]  1501 Code Stroke. From SNF. 10AM LKN. 1030 aphasia. Residual weakness from prior stroke. TNK given. Right leg weakness improving.  [ML]    Clinical Course User Index [ML] Rosine Abe, MD [WF] Tedd Sias, [place]                           Medical Decision Making Amount and/or Complexity of Data Reviewed Labs: ordered. Decision-making details documented in ED Course. Radiology: ordered.  Risk Prescription drug management.   This patient presents to the ED for concern of aphasia, R sided weakness, this involves a number of treatment options, and is a complaint that carries with it a high risk of complications and morbidity.  The differential diagnosis includes stroke versus seizure most likely/highest concern differential.  Also considered TIA, metabolic encephalopathy, DKA  The differential diagnosis for AMS is extensive and includes, but  is not limited to: drug overdose - opioids, alcohol, sedatives, antipsychotics, drug withdrawal, others; Metabolic: hypoxia, hypoglycemia, hyperglycemia, hypercalcemia, hypernatremia, hyponatremia, uremia, hepatic encephalopathy, hypothyroidism, hyperthyroidism, vitamin B12 or thiamine deficiency, carbon monoxide poisoning, Wilson's disease, Lactic acidosis, DKA/HHOS; Infectious: meningitis, encephalitis, bacteremia/sepsis, urinary tract infection, pneumonia, neurosyphilis; Structural: Space-occupying lesion, (  brain tumor, subdural hematoma, hydrocephalus,); Vascular: stroke, subarachnoid hemorrhage, coronary ischemia, hypertensive encephalopathy, CNS vasculitis, thrombotic thrombocytopenic purpura, disseminated intravascular coagulation, hyperviscosity; Psychiatric: Schizophrenia, depression; Other: Seizure, hypothermia, heat stroke, dementia    Co morbidities: Discussed in HPI   Brief History:  Patient is a 61 year old female brought into the emergency department by EMS for stroke activation for right sided weakness last known normal 10 AM this morning.  No known seizure history.  No history of stroke.  Patient is a diabetic and CBG was greater than 500.  Airway intact at this time. Pt seems somewhat confused. Neurology examining pt at this time. 11:36 AM   With facility [facility] who confirmed onset of her symptoms they had talked to neurology earlier today.  Seems that her symptoms were sudden onset and they did notice some right facial droop prior to transport which was not present when she arrived in the emergency room.  Patient continues to be aphasic. She follows commands however.  Level 5 caveat due to altered mental status/nonverbal status/aphasia    EMR reviewed including pt PMHx, past surgical history and past visits to ER.   See HPI for more details   Lab Tests:   I ordered and independently interpreted labs. Labs notable for CMP with hyperglycemia no anion gap.   Mild AST ALT elevation.  Blood sugar of 545.  Urinalysis with rare bacteria negative for leukocytes or nitrates.  WBC less than 5.  Not consistent with urinary tract infection.  VBG without acidosis.  CBC without leukocytosis chronic anemia is present.  Somewhat improved   Imaging Studies:  Abnormal findings. I personally reviewed all imaging studies. Imaging notable for IMPRESSION:  1. Chronic bibasilar interstitial prominence with scattered  bibasilar consolidation, which has been stable since the 06/25/2021  exam. Findings are likely sequela of postinflammatory scarring or  chronic aspiration pneumonia.  2. Otherwise unremarkable chest x-ray.      Electronically Signed    By: Randa Ngo M.D.    On: 10/28/2021 15:02    Discussed with [place] she has not had any fevers or coughing her symptoms were sudden in onset and I do not see any acute findings on chest x-ray.  Agree with radiology read that these findings are chronic / scarring.    Cardiac Monitoring:  The patient was maintained on a cardiac monitor.  I personally viewed and interpreted the cardiac monitored which showed an underlying rhythm of: NSR  EKG non-ischemic   Medicines ordered:  I ordered medication including insulin for hyperglycemia, TNK for possible stroke, 1L NS at 165m/hr, ativan.  Reevaluation of the patient after these medicines showed that the patient improved I have reviewed the patients home medicines and have made adjustments as needed   Critical Interventions:     Consults/Attending Physician   I requested consultation with Dr. SQuinn Axewho will admit to Neuro ICU,  and discussed lab and imaging findings as well as pertinent plan - they recommend: admission   Reevaluation:  After the interventions noted above I re-evaluated patient and found that they have :improved  Exam seems to be improving.  Aphasia seems more intermittent than prior and she is moving both her lower  extremities  Social Determinants of Health:      Problem List / ED Course:  Likely stroke - right-sided weakness and right facial droop as well as aphasia that began abruptly between 10 and 10:30 AM this morning.  Was given TNK by neurology.  Admitted to neuro ICU  for observation. Hyperglycemia, sodium grossly normal.  Could be contributing to some of her symptoms however with sudden onset of symptoms lower suspicion for this.   Dispostion:  After consideration of the diagnostic results and the patients response to treatment, I feel that the patent would benefit from admission   Final Clinical Impression(s) / ED Diagnoses Final diagnoses:  Right sided weakness  Aphasia    Rx / DC Orders ED Discharge Orders     None         Tedd Sias, [place] 10/28/21 1520    Lajean Saver, MD 10/28/21 [phone]

## 2021-10-28 NOTE — Progress Notes (Signed)
EEG complete - results pending 

## 2021-10-29 ENCOUNTER — Inpatient Hospital Stay (HOSPITAL_COMMUNITY): Payer: Medicare Other

## 2021-10-29 DIAGNOSIS — R569 Unspecified convulsions: Secondary | ICD-10-CM | POA: Diagnosis not present

## 2021-10-29 DIAGNOSIS — I6389 Other cerebral infarction: Secondary | ICD-10-CM

## 2021-10-29 LAB — ECHOCARDIOGRAM COMPLETE
AR max vel: 3.29 cm2
AV Area VTI: 4.08 cm2
AV Area mean vel: 3.18 cm2
AV Mean grad: 1 mmHg
AV Peak grad: 2.5 mmHg
Ao pk vel: 0.78 m/s
Area-P 1/2: 4.96 cm2
S' Lateral: 2.2 cm
Weight: 1982.38 oz

## 2021-10-29 LAB — RAPID URINE DRUG SCREEN, HOSP PERFORMED
Amphetamines: NOT DETECTED
Barbiturates: NOT DETECTED
Benzodiazepines: NOT DETECTED
Cocaine: NOT DETECTED
Opiates: NOT DETECTED
Tetrahydrocannabinol: NOT DETECTED

## 2021-10-29 LAB — GLUCOSE, CAPILLARY
Glucose-Capillary: 142 mg/dL — ABNORMAL HIGH (ref 70–99)
Glucose-Capillary: 135 mg/dL — ABNORMAL HIGH (ref 70–99)
Glucose-Capillary: 159 mg/dL — ABNORMAL HIGH (ref 70–99)
Glucose-Capillary: 266 mg/dL — ABNORMAL HIGH (ref 70–99)

## 2021-10-29 LAB — LIPID PANEL
Cholesterol: 99 mg/dL (ref 0–200)
HDL: 27 mg/dL — ABNORMAL LOW (ref >40)
LDL Cholesterol: 64 mg/dL (ref 0–99)
Total CHOL/HDL Ratio: 3.7 RATIO
Triglycerides: 40 mg/dL (ref <150)
VLDL: 8 mg/dL (ref 0–40)

## 2021-10-29 MED ORDER — LACOSAMIDE 50 MG PO TABS: 50.0000 mg | ORAL_TABLET | Freq: Two times a day (BID) | ORAL | Status: DC

## 2021-10-29 MED ORDER — LACOSAMIDE 50 MG PO TABS
50.0000 mg | ORAL_TABLET | Freq: Two times a day (BID) | ORAL | Status: DC
  Administered 2021-10-29 – 2021-10-30 (×2): 50 mg via ORAL
  Filled 2021-10-29 (×2): qty 1

## 2021-10-29 MED ORDER — ASPIRIN 81 MG PO TBEC
81.0000 mg | DELAYED_RELEASE_TABLET | Freq: Every day | ORAL | Status: DC
  Administered 2021-10-29 – 2021-11-09 (×12): 81 mg via ORAL
  Filled 2021-10-29 (×12): qty 1

## 2021-10-29 MED ORDER — LORAZEPAM 2 MG/ML IJ SOLN
2.0000 mg | Freq: Four times a day (QID) | INTRAMUSCULAR | Status: DC | PRN
  Administered 2021-10-30 (×2): 2 mg via INTRAVENOUS
  Filled 2021-10-29 (×3): qty 1

## 2021-10-29 NOTE — Progress Notes (Signed)
  Echocardiogram 2D Echocardiogram has been performed.  Marybelle Killings 10/29/2021, 2:55 PM

## 2021-10-29 NOTE — Progress Notes (Signed)
LTM EEG hooked up and running - no initial skin breakdown - push button tested - neuro notified. [facility] monitoring.  

## 2021-10-29 NOTE — Progress Notes (Signed)
PT Cancellation Note  Patient Details Name: Brittany [name] MRN: [id number] DOB: [birth year]   Cancelled Treatment:    Reason Eval/Treat Not Completed: Other (comment). RN reporting pt was agitated and combative earlier when staff had to stimulate her to hook up LTM EEG. Pt currently in restraints but calm and asleep. Will plan to hold off on PT at this time to avoid stimulating and agitating pt to reduce risk of LTM EEG getting unhooked.    Moishe Spice, PT, DPT Acute Rehabilitation Services  Office: [place] 10/29/2021, 4:09 PM

## 2021-10-29 NOTE — Inpatient Diabetes Management (Signed)
Inpatient Diabetes Program Recommendations  AACE/ADA: New Consensus Statement on Inpatient Glycemic Control (2015)  Target Ranges:  Prepandial:   less than 140 mg/dL      Peak postprandial:   less than 180 mg/dL (1-2 hours)      Critically ill patients:  140 - 180 mg/dL   Lab Results  Component Value Date   GLUCAP 159 (H) 10/29/2021   HGBA1C 15.0 (H) 09/04/2021    Review of Glycemic Control  Latest Reference Range & Units 10/28/21 16:48 10/28/21 21:26 10/29/21 07:35 10/29/21 11:25 10/29/21 16:48  Glucose-Capillary 70 - 99 mg/dL 157 (H) 264 (H) 142 (H) 135 (H) 159 (H)  (H): Data is abnormally high  Diabetes history: DM1 (does NOT make any insulin; requires basal, correction, and carb coverage insulin) Outpatient Diabetes medications: Lantus 8 units daily, Humalog 0-9 units TID with meals and at HS Current orders for Inpatient glycemic control: Levemir 11 units qd, Novolog 5 units tid meal coverage  Inpatient Diabetes Program Recommendations:   Patient was just discharged on 10/23/2021. While in the hospital: -Add Novolog 0-6 units tid correction, 0-5 units hs Will follow during hospitalization.  Thank you, Nani Gasser. Ziza Hastings, RN, MSN, CDE  Diabetes Coordinator Inpatient Glycemic Control Team Team Pager [phone] (8am-5pm) 10/29/2021 7:16 PM

## 2021-10-29 NOTE — Progress Notes (Addendum)
STROKE TEAM PROGRESS NOTE   SUBJECTIVE (INTERVAL HISTORY) Her RNs are at the bedside.  Pt agitated in bed, kicking and hitting RNs from both sides. Cursing around. Moving all extremities equally and speech fluent with cursing. Was put on restraint.   Later pt calmer and ate lunch well with RN feeding. Pt now calm and asleep. Given hx of seizure, will do LTM EEG overnight. MRI no infarct.    OBJECTIVE Temp:  [97.1 F (36.2 C)-98.5 F (36.9 C)] 98.1 F (36.7 C) (09/17 1600) Pulse Rate:  [72-106] 105 (09/17 1800) Cardiac Rhythm: Normal sinus rhythm (09/16 2000) Resp:  [9-21] 14 (09/17 1800) BP: (107-155)/(71-105) 139/101 (09/17 1800) SpO2:  [86 %-100 %] 94 % (09/17 1800)  Recent Labs  Lab 10/28/21 1648 10/28/21 2126 10/29/21 0735 10/29/21 1125 10/29/21 1648  GLUCAP 157* 264* 142* 135* 159*   Recent Labs  Lab 10/28/21 1140 10/28/21 1145 10/28/21 1311  NA 126* 127* 127*  K 4.7 4.4 4.5  CL 101 99  --   CO2 14*  --   --   GLUCOSE 545* 561*  --   BUN 20 20  --   CREATININE 0.87 0.60  --   CALCIUM 8.4*  --   --    Recent Labs  Lab 10/28/21 1140  AST 45*  ALT 50*  ALKPHOS 190*  BILITOT 0.5  PROT 8.2*  ALBUMIN 3.1*   Recent Labs  Lab 10/28/21 1140 10/28/21 1145 10/28/21 1311  WBC 6.7  --   --   NEUTROABS 3.3  --   --   HGB 11.1* 13.3 12.6  HCT 35.4* 39.0 37.0  MCV 94.9  --   --   PLT 419*  --   --    No results for input(s): "CKTOTAL", "CKMB", "CKMBINDEX", "TROPONINI" in the last 168 hours. Recent Labs    10/28/21 1742  LABPROT 13.5  INR 1.0   Recent Labs    10/28/21 1355  COLORURINE STRAW*  LABSPEC 1.022  PHURINE 5.0  GLUCOSEU >=500*  HGBUR NEGATIVE  BILIRUBINUR NEGATIVE  KETONESUR NEGATIVE  PROTEINUR NEGATIVE  NITRITE NEGATIVE  LEUKOCYTESUR NEGATIVE       Component Value Date/Time   CHOL 99 10/29/2021 0048   TRIG 40 10/29/2021 0048   HDL 27 (L) 10/29/2021 0048   CHOLHDL 3.7 10/29/2021 0048   VLDL 8 10/29/2021 0048   LDLCALC 64  10/29/2021 0048   Lab Results  Component Value Date   HGBA1C 15.0 (H) 09/04/2021      Component Value Date/Time   LABOPIA NONE DETECTED 08/13/2021 1717   COCAINSCRNUR NONE DETECTED 08/13/2021 1717   LABBENZ NONE DETECTED 08/13/2021 1717   AMPHETMU NONE DETECTED 08/13/2021 1717   THCU NONE DETECTED 08/13/2021 1717   LABBARB NONE DETECTED 08/13/2021 1717    Recent Labs  Lab 10/28/21 1140  ETH <10    I have personally reviewed the radiological images below and agree with the radiology interpretations.  ECHOCARDIOGRAM COMPLETE  Result Date: 10/29/2021    ECHOCARDIOGRAM REPORT   Patient Name:   Brittany [name] Date of Exam: 10/29/2021 Medical Rec #:  [id number]     Height:       60.0 in Accession #:    [id number]    Weight:       123.9 lb Date of Birth:  04/29/1960    BSA:          1.523 m Patient Age:    61 years      BP:  130/95 mmHg Patient Gender: F             HR:           86 bpm. Exam Location:  Inpatient Procedure: 2D Echo, Color Doppler and Cardiac Doppler Indications:    Stroke  History:        Patient has prior history of Echocardiogram examinations, most                 recent 11/06/2020. Risk Factors:Hypertension and Diabetes. Lupus.  Sonographer:    Clayton Lefort RDCS (AE) Referring Phys: [place]  1. Left ventricular ejection fraction, by estimation, is 55 to 60%. The left ventricle has normal function. The left ventricle has no regional wall motion abnormalities. There is mild asymmetric left ventricular hypertrophy of the basal segment. Left ventricular diastolic parameters are consistent with Grade I diastolic dysfunction (impaired relaxation).  2. Right ventricular systolic function is low normal. The right ventricular size is normal. Tricuspid regurgitation signal is inadequate for assessing PA pressure.  3. The mitral valve is abnormal. Trivial mitral valve regurgitation. Moderate mitral annular calcification.  4. The aortic valve is tricuspid.  Aortic valve regurgitation is not visualized.  5. The inferior vena cava is normal in size with greater than 50% respiratory variability, suggesting right atrial pressure of 3 mmHg. Comparison(s): Prior images reviewed side by side. LVEF remains normal at 55-60%. FINDINGS  Left Ventricle: Left ventricular ejection fraction, by estimation, is 55 to 60%. The left ventricle has normal function. The left ventricle has no regional wall motion abnormalities. The left ventricular internal cavity size was normal in size. There is  mild asymmetric left ventricular hypertrophy of the basal segment. Left ventricular diastolic parameters are consistent with Grade I diastolic dysfunction (impaired relaxation). Right Ventricle: The right ventricular size is normal. No increase in right ventricular wall thickness. Right ventricular systolic function is low normal. Tricuspid regurgitation signal is inadequate for assessing PA pressure. Left [facility]: Left atrial size was normal in size. Right [facility]: Right atrial size was normal in size. Pericardium: There is no evidence of pericardial effusion. Mitral Valve: The mitral valve is abnormal. There is mild thickening of the mitral valve leaflet(s). There is mild calcification of the mitral valve leaflet(s). Moderate mitral annular calcification. Trivial mitral valve regurgitation. Tricuspid Valve: The tricuspid valve is grossly normal. Tricuspid valve regurgitation is trivial. Aortic Valve: The aortic valve is tricuspid. There is mild aortic valve annular calcification. Aortic valve regurgitation is not visualized. Aortic valve mean gradient measures 1.0 mmHg. Aortic valve peak gradient measures 2.5 mmHg. Aortic valve area, by  VTI measures 4.08 cm. Pulmonic Valve: The pulmonic valve was grossly normal. Pulmonic valve regurgitation is trivial. Aorta: The aortic root is normal in size and structure. Venous: The inferior vena cava is normal in size with greater than 50% respiratory  variability, suggesting right atrial pressure of 3 mmHg. IAS/Shunts: No atrial level shunt detected by color flow Doppler.  LEFT VENTRICLE PLAX 2D LVIDd:         3.20 cm   Diastology LVIDs:         2.20 cm   LV e' medial:    6.09 cm/s LV PW:         1.10 cm   LV E/e' medial:  13.7 LV IVS:        1.20 cm   LV e' lateral:   5.98 cm/s LVOT diam:     2.30 cm   LV E/e' lateral:  14.0 LV SV:         47 LV SV Index:   31 LVOT Area:     4.15 cm  RIGHT VENTRICLE RV Basal diam:  2.40 cm RV S prime:     6.85 cm/s TAPSE (M-mode): 1.6 cm LEFT ATRIUM             Index        RIGHT ATRIUM          Index LA diam:        2.20 cm 1.44 cm/m   RA Area:     7.79 cm LA Vol (A2C):   12.7 ml 8.34 ml/m   RA Volume:   13.50 ml 8.86 ml/m LA Vol (A4C):   25.1 ml 16.48 ml/m LA Biplane Vol: 19.3 ml 12.67 ml/m  AORTIC VALVE AV Area (Vmax):    3.29 cm AV Area (Vmean):   3.18 cm AV Area (VTI):     4.08 cm AV Vmax:           78.40 cm/s AV Vmean:          53.100 cm/s AV VTI:            0.115 m AV Peak Grad:      2.5 mmHg AV Mean Grad:      1.0 mmHg LVOT Vmax:         62.00 cm/s LVOT Vmean:        40.600 cm/s LVOT VTI:          0.113 m LVOT/AV VTI ratio: 0.98  AORTA Ao Root diam: 3.30 cm Ao Asc diam:  3.20 cm MITRAL VALVE MV Area (PHT): 4.96 cm     SHUNTS MV Decel Time: 153 msec     Systemic VTI:  0.11 m MV E velocity: 83.70 cm/s   Systemic Diam: 2.30 cm MV A velocity: 119.00 cm/s MV E/A ratio:  0.70 Rozann Lesches MD Electronically signed by Rozann Lesches MD Signature Date/Time: 10/29/2021/3:08:29 PM    Final    MR BRAIN WO CONTRAST  Result Date: 10/29/2021 CLINICAL DATA:  Acute neuro deficit. EXAM: MRI HEAD WITHOUT CONTRAST TECHNIQUE: Multiplanar, multiecho pulse sequences of the brain and surrounding structures were obtained without intravenous contrast. COMPARISON:  CT head 10/28/2021 FINDINGS: Brain: Limited MRI. The patient was not able to hold still and was scanned in the lateral decubitus position. After several sequences, the  patient left the scanner and the study was terminated. Diffusion-weighted imaging is diagnostic with mild motion. No acute infarct identified Chronic infarct left cerebellum and left occipital lobe as noted on prior studies. Chronic microvascular ischemic change also noted in the white matter. No hydrocephalus. IMPRESSION: Limited and incomplete study.  No acute infarct. Electronically Signed   By: Franchot Gallo M.D.   On: 10/29/2021 11:11   EEG adult  Result Date: 10/28/2021 Greta Doom, MD     10/28/2021  5:54 PM History: Concern for seizure Sedation: none Technique: This EEG was acquired with electrodes placed according to the International 10-20 electrode system (including Fp1, Fp2, F3, F4, C3, C4, P3, P4, O1, O2, T3, T4, T5, T6, A1, A2, Fz, Cz, Pz). The following electrodes were missing or displaced: none. Background: The background consists of intermixed alpha and beta activities. There is a well defined posterior dominant rhythm of 9 Hz that is better seen on the right than left. Sleep is not recorded. Photic stimulation: Physiologic driving is not performed EEG Abnormalities: 1) Assymetric PDR Clinical Interpretation: This  EEG recorded evidence of a left posterior cortical dysfunction consistent with the patient's know parieto-occipital infarct. There was no seizure or seizure predisposition recorded on this study. Please note that lack of epileptiform activity on EEG does not preclude the possibility of epilepsy. Roland Rack, MD [facility] [phone] If 7pm- 7am, please page neurology on call as listed in [place].   DG Chest Portable 1 View  Result Date: 10/28/2021 CLINICAL DATA:  Altered level of consciousness EXAM: PORTABLE CHEST 1 VIEW COMPARISON:  09/21/2021 FINDINGS: Single frontal view of the chest demonstrates a stable cardiac silhouette. Chronic bibasilar interstitial prominence and linear areas of consolidation, right greater than left. No new airspace  disease, effusion, or pneumothorax. No acute bony abnormalities. IMPRESSION: 1. Chronic bibasilar interstitial prominence with scattered bibasilar consolidation, which has been stable since the 06/25/2021 exam. Findings are likely sequela of postinflammatory scarring or chronic aspiration pneumonia. 2. Otherwise unremarkable chest x-ray. Electronically Signed   By: Randa Ngo M.D.   On: 10/28/2021 15:02   CT ANGIO HEAD NECK W WO CM W PERF (CODE STROKE)  Result Date: 10/28/2021 CLINICAL DATA:  61 year old female code stroke presentation. Chronic infarcts. EXAM: CT ANGIOGRAPHY HEAD AND NECK TECHNIQUE: Multidetector CT imaging of the head and neck was performed using the standard protocol during bolus administration of intravenous contrast. Multiplanar CT image reconstructions and MIPs were obtained to evaluate the vascular anatomy. Carotid stenosis measurements (when applicable) are obtained utilizing NASCET criteria, using the distal internal carotid diameter as the denominator. CT Cerebral Perfusion also performed, with planned analysis by RAPID software, but the study is nondiagnostic due to patient motion. RADIATION DOSE REDUCTION: This exam was performed according to the departmental dose-optimization program which includes automated exposure control, adjustment of the mA and/or kV according to patient size and/or use of iterative reconstruction technique. CONTRAST:  190m OMNIPAQUE IOHEXOL 350 MG/ML SOLN COMPARISON:  Plain head CT 1148 hours today. FINDINGS: CT Perfusion: Nondiagnostic due to pronounced patient motion. CTA NECK Skeleton: Cervical spine degeneration. No acute osseous abnormality identified. Upper chest: Centrilobular emphysema. Mild upper lung atelectasis. No superior mediastinal lymphadenopathy. Other neck: No neck mass or lymphadenopathy identified. Aortic arch: Calcified aortic atherosclerosis. Aberrant origin of the right subclavian artery, 4 vessel arch configuration. Right carotid  system: Mildly tortuous proximal right CCA. Right CCA and right ICA intermittent plaque with no significant stenosis to the skull base. Left carotid system: Mildly tortuous proximal left CCA. Less pronounced left carotid plaque in the neck with no stenosis to the skull base. Vertebral arteries: Aberrant origin proximal right subclavian artery with atherosclerosis but no significant stenosis. Right vertebral artery origin is normal. Right vertebral artery is non dominant and diminutive but patent to the skull base with no focal stenosis identified. Proximal left subclavian artery soft and calcified plaque without significant stenosis. Calcified plaque at the left vertebral artery origin but no significant stenosis. Tortuous left V1 segment. Dominant left vertebral artery is patent to the skull base with only mild stenosis. CTA HEAD Posterior circulation: Dominant left V4 segment. Patent PICA origins and no distal vertebral or vertebrobasilar junction stenosis. Patent basilar artery without stenosis. Patent SCA and PCA origins. Right posterior communicating artery is present. Smaller left posterior communicating artery is present. There is up to moderate irregularity of the left PCA with attenuated distal PCA branches, chronic infarct in that vascular territory. Right PCA branches are patent with only mild irregularity. Anterior circulation: Both ICA siphons are patent. Mild siphon plaque, no siphon stenosis. Normal posterior communicating artery  origins. Patent carotid termini, MCA and ACA origins. Tortuous A1 segments. Anterior communicating artery and bilateral ACA branches are within normal limits. Right MCA M1 segment and bifurcation are patent with only mild tortuosity and irregularity. No right MCA branch occlusion. Left MCA M1 segment and bifurcation are patent with only mild irregularity. No left MCA branch occlusion is identified. Venous sinuses: Early contrast timing, not well evaluated. Anatomic variants:  Dominant left vertebral artery. Aberrant origin of the right subclavian artery. Fetal type right PCA origin. Review of the MIP images confirms the above findings IMPRESSION: 1. Negative for emergent large vessel occlusion. Nondiagnostic CT Perfusion due to patient motion. 2. Irregular and attenuated Left PCA branches compatible with chronic infarct in that territory. 3. Otherwise generally mild for age atherosclerosis in the head and neck. No other significant arterial stenosis identified. 4. More advanced Aortic Atherosclerosis (ICD10-I70.0). 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 5. Emphysema (ICD10-J43.9). Preliminary CTA results were discussed by telephone with Neurology Dr. Quinn Axe on 10/28/2021 at 1214 hours. Electronically Signed   By: Genevie Ann M.D.   On: 10/28/2021 12:35   CT HEAD CODE STROKE WO CONTRAST  Result Date: 10/28/2021 CLINICAL DATA:  Code stroke. 61 year old female with history of multiple prior infarcts. New aphasia. Possible seizure activity. EXAM: CT HEAD WITHOUT CONTRAST TECHNIQUE: Contiguous axial images were obtained from the base of the skull through the vertex without intravenous contrast. RADIATION DOSE REDUCTION: This exam was performed according to the departmental dose-optimization program which includes automated exposure control, adjustment of the mA and/or kV according to patient size and/or use of iterative reconstruction technique. COMPARISON:  Head CT 09/21/2021. FINDINGS: Brain: Multifocal chronic infarcts and encephalomalacia appears stable by CT since 09/21/2021. This includes involvement in the anterior right MCA territory, extensive left temporal and occipital lobe encephalomalacia, extensive left cerebellar encephalomalacia. Additional confluent bilateral cerebral white matter hypodensity including deep white matter capsule involvement. No acute intracranial hemorrhage identified. No midline shift, mass effect, or evidence of intracranial mass lesion. No  ventriculomegaly. No cortically based acute infarct identified. Vascular: Calcified atherosclerosis at the skull base. No suspicious intracranial vascular hyperdensity. Skull: No acute osseous abnormality identified. Sinuses/Orbits: Visualized paranasal sinuses and mastoids are stable and well aerated. Other: No acute orbit or scalp soft tissue finding. ASPECTS [facility] Stroke Program Early CT Score) Total score (0-10 with 10 being normal): 10, chronic encephalomalacia. IMPRESSION: 1. Stable CT appearance of advanced ischemic disease since last month. 2. No acute cortically based infarct or acute intracranial hemorrhage identified. ASPECTS 10. 3. Study discussed by telephone with Dr. Quinn Axe on 10/28/2021 at 1155 hours. Electronically Signed   By: Genevie Ann M.D.   On: 10/28/2021 11:58     PHYSICAL EXAM  Temp:  [97.1 F (36.2 C)-98.5 F (36.9 C)] 98.1 F (36.7 C) (09/17 1600) Pulse Rate:  [72-106] 105 (09/17 1800) Resp:  [9-21] 14 (09/17 1800) BP: (107-155)/(71-105) 139/101 (09/17 1800) SpO2:  [86 %-100 %] 94 % (09/17 1800)  General - Well nourished, well developed, agitated and combative, hitting RNs on both sides.  Ophthalmologic - fundi not visualized due to noncooperation.  Cardiovascular - Regular rhythm and rate.  Neuro - awake, alert, eyes open, cursing around with fluent language, not cooperative with exam. No gaze palsy, tracking bilaterally. No facial droop. Moving all extremities strong equally, Sensation, coordination and gait not tested.   ASSESSMENT/PLAN Brittany [name] is a 60 y.o. 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  TNK given.  ? Left temporal seizure in the setting of hyperglycemia ?  Stroke mimic status post TNK CT no acute abnormality CTA head neck unremarkable MRI no acute infarct EEG asymmetric PDR, no seizure LTM EEG  pending 2D Echo EF 55 to 60% LDL 64 HgbA1c pending UDS pending SCDs for VTE prophylaxis aspirin 81 mg daily prior to admission, now on aspirin 81 mg daily.  Currently on Vimpat 50 twice daily Ongoing aggressive stroke risk factor management Therapy recommendations: Pending Disposition: Pending  History of seizure 03/2021 admitted for confusion, possible seizure and possible COVID.  EEG showed LPD with brief ictal discharge.  Long-term EEG showed left temporal epileptogenicity, discharged on Keppra 500 Per pharmacy, patient did not refill Keppra  History of stroke 07/2019 admitted for nausea vomiting, headache and generalized weakness.  Found to have DKA.  MRI showed left inferior cerebellum infarct.  LDL 153, A1c 13.5.  Carotid Doppler negative.  EF 55 to 60%. 10/2020 admitted for altered mental status, slurred speech, dysphagia and aphasia with right-sided mild weakness.  CT showed left PCA infarct.  MRI showed early subacute left PCA infarct, old bilateral BG lacunar infarct, left cerebellum old infarct.  Carotid Doppler negative, MRA negative.  EF 65 to 70%.  Discharged on DAPT.  Agitation and combative ? 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 ?  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 This patient's care requires constant monitoring of vital signs, hemodynamics, respiratory and cardiac monitoring, review of multiple databases, neurological assessment, discussion with family, other specialists and medical decision making of high complexity. I spent 35 minutes of neurocritical care time in the care of this patient.   Rosalin Hawking, MD PhD Stroke Neurology 10/29/2021 6:43 PM    To contact Stroke Continuity provider, please refer to [email or web address] After hours, contact General Neurology

## 2021-10-29 NOTE — Progress Notes (Signed)
While bathing, the patient became agitated, pulling at lines, and swatting at staff to keep away. Patient also verbally threatening staff. Dr. Erlinda Hong came to bedside to see patient. Received orders for restraints and prn ativan for agitation.

## 2021-10-29 NOTE — Progress Notes (Signed)
OT Cancellation Note  Patient Details Name: Brittany [name] MRN: [id number] DOB: Jul 07, 1960   Cancelled Treatment:    Reason Eval/Treat Not Completed: Patient not medically ready (pt with active bedrest order)  [name] 10/29/2021, 8:24 AM Cleta Alberts, OTR/L Acute Rehabilitation Services Office: [phone]

## 2021-10-30 DIAGNOSIS — E11649 Type 2 diabetes mellitus with hypoglycemia without coma: Secondary | ICD-10-CM | POA: Diagnosis not present

## 2021-10-30 DIAGNOSIS — R569 Unspecified convulsions: Secondary | ICD-10-CM | POA: Diagnosis not present

## 2021-10-30 DIAGNOSIS — E7800 Pure hypercholesterolemia, unspecified: Secondary | ICD-10-CM | POA: Diagnosis not present

## 2021-10-30 DIAGNOSIS — R739 Hyperglycemia, unspecified: Secondary | ICD-10-CM | POA: Diagnosis not present

## 2021-10-30 LAB — GLUCOSE, CAPILLARY
Glucose-Capillary: 421 mg/dL — ABNORMAL HIGH (ref 70–99)
Glucose-Capillary: 516 mg/dL (ref 70–99)
Glucose-Capillary: 111 mg/dL — ABNORMAL HIGH (ref 70–99)
Glucose-Capillary: 245 mg/dL — ABNORMAL HIGH (ref 70–99)
Glucose-Capillary: 277 mg/dL — ABNORMAL HIGH (ref 70–99)

## 2021-10-30 LAB — CBC
HCT: 31.1 % — ABNORMAL LOW (ref 36.0–46.0)
Hemoglobin: 10.4 g/dL — ABNORMAL LOW (ref 12.0–15.0)
MCH: 29.7 pg (ref 26.0–34.0)
MCHC: 33.4 g/dL (ref 30.0–36.0)
MCV: 88.9 fL (ref 80.0–100.0)
Platelets: 477 10*3/uL — ABNORMAL HIGH (ref 150–400)
RBC: 3.5 MIL/uL — ABNORMAL LOW (ref 3.87–5.11)
RDW: 15.3 % (ref 11.5–15.5)
WBC: 7.4 10*3/uL (ref 4.0–10.5)
nRBC: 0.4 % — ABNORMAL HIGH (ref 0.0–0.2)

## 2021-10-30 LAB — BASIC METABOLIC PANEL
Anion gap: 13 (ref 5–15)
Anion gap: 7 (ref 5–15)
BUN: 16 mg/dL (ref 8–23)
BUN: 21 mg/dL (ref 8–23)
CO2: 20 mmol/L — ABNORMAL LOW (ref 22–32)
CO2: 23 mmol/L (ref 22–32)
Calcium: 8.9 mg/dL (ref 8.9–10.3)
Calcium: 8.8 mg/dL — ABNORMAL LOW (ref 8.9–10.3)
Chloride: 101 mmol/L (ref 98–111)
Chloride: 105 mmol/L (ref 98–111)
Creatinine, Ser: 0.99 mg/dL (ref 0.44–1.00)
Creatinine, Ser: 0.86 mg/dL (ref 0.44–1.00)
GFR, Estimated: >60 mL/min (ref >60)
GFR, Estimated: >60 mL/min (ref >60)
Glucose, Bld: 305 mg/dL — ABNORMAL HIGH (ref 70–99)
Glucose, Bld: 201 mg/dL — ABNORMAL HIGH (ref 70–99)
Potassium: 5.4 mmol/L — ABNORMAL HIGH (ref 3.5–5.1)
Potassium: 4.5 mmol/L (ref 3.5–5.1)
Sodium: 134 mmol/L — ABNORMAL LOW (ref 135–145)
Sodium: 135 mmol/L (ref 135–145)

## 2021-10-30 LAB — HEMOGLOBIN A1C
Hgb A1c MFr Bld: 13.2 % — ABNORMAL HIGH (ref 4.8–5.6)
Mean Plasma Glucose: 332.14 mg/dL

## 2021-10-30 MED ORDER — INSULIN ASPART 100 UNIT/ML IJ SOLN
3.0000 [IU] | Freq: Three times a day (TID) | INTRAMUSCULAR | Status: DC
  Administered 2021-10-30 – 2021-11-01 (×5): 3 [IU] via SUBCUTANEOUS

## 2021-10-30 MED ORDER — LACOSAMIDE 50 MG PO TABS
100.0000 mg | ORAL_TABLET | Freq: Two times a day (BID) | ORAL | Status: DC
  Administered 2021-10-30 – 2021-11-09 (×21): 100 mg via ORAL
  Filled 2021-10-30 (×21): qty 2

## 2021-10-30 MED ORDER — INSULIN ASPART 100 UNIT/ML IJ SOLN: 0.0000 [IU] | Freq: Three times a day (TID) | INTRAMUSCULAR | Status: DC

## 2021-10-30 MED ORDER — INSULIN ASPART 100 UNIT/ML IJ SOLN
0.0000 [IU] | Freq: Three times a day (TID) | INTRAMUSCULAR | Status: DC
  Administered 2021-10-30: 5 [IU] via SUBCUTANEOUS
  Administered 2021-10-30: 15 [IU] via SUBCUTANEOUS
  Administered 2021-10-31: 8 [IU] via SUBCUTANEOUS
  Administered 2021-10-31: 2 [IU] via SUBCUTANEOUS
  Administered 2021-10-31: 5 [IU] via SUBCUTANEOUS
  Administered 2021-11-01: 15 [IU] via SUBCUTANEOUS
  Administered 2021-11-01 (×2): 5 [IU] via SUBCUTANEOUS
  Administered 2021-11-02: 8 [IU] via SUBCUTANEOUS
  Administered 2021-11-02: 3 [IU] via SUBCUTANEOUS
  Administered 2021-11-02: 11 [IU] via SUBCUTANEOUS

## 2021-10-30 MED ORDER — HYDROXYZINE HCL 10 MG PO TABS
10.0000 mg | ORAL_TABLET | Freq: Three times a day (TID) | ORAL | Status: DC | PRN
  Administered 2021-10-30 – 2021-11-08 (×6): 10 mg via ORAL
  Filled 2021-10-30 (×9): qty 1

## 2021-10-30 MED ORDER — INSULIN ASPART 100 UNIT/ML IJ SOLN
0.0000 [IU] | Freq: Every day | INTRAMUSCULAR | Status: DC
  Administered 2021-10-30: 3 [IU] via SUBCUTANEOUS
  Administered 2021-11-01: 4 [IU] via SUBCUTANEOUS

## 2021-10-30 NOTE — Progress Notes (Signed)
PT Cancellation Note  Patient Details Name: Brittany [name] MRN: [id number] DOB: 09-Feb-1961   Cancelled Treatment:    Reason Eval/Treat Not Completed: Fatigue/lethargy limiting ability to participate;Patient at procedure or test/unavailable. Pt on LTM EEG. Given ativan early this AM due to agitation. Sleeping and unable to fully arouse to participate in PT eval. PT to continue efforts.   Lorriane Shire 10/30/2021, 9:31 AM  Lorrin Goodell, PT  Office # [phone] Pager [phone]

## 2021-10-30 NOTE — Progress Notes (Signed)
vLTM discontinued.  No skin breakdown noted at all skin sites.  [facility] notified 

## 2021-10-30 NOTE — Progress Notes (Signed)
[facility]) Hospital Liaison note:  This is a pending outpatient-based Palliative Care patient. Will continue to follow for disposition.  Please call with any outpatient palliative questions or concerns.  Thank you, Lorelee Market, LPN [facility] Liaison [phone]

## 2021-10-30 NOTE — Plan of Care (Signed)
  Problem: Safety: Goal: Non-violent Restraint(s) Outcome: Progressing   Problem: Safety: Goal: Ability to remain free from injury will improve Outcome: Progressing   Problem: Ischemic Stroke/TIA Tissue Perfusion: Goal: Complications of ischemic stroke/TIA will be minimized Outcome: Progressing

## 2021-10-30 NOTE — Progress Notes (Addendum)
STROKE TEAM PROGRESS NOTE   SUBJECTIVE (INTERVAL HISTORY) Seen in room, currently requiring bilateral wrist restraints and a chest belt for safety. Glucose was 516 this morning, added 3u of novolog with meals.  Plan to d/c back to assisted living [facility] if possible), TOC consulted for help with discharge planning LTM off, increased vimpat to '100mg'$  BID.   OBJECTIVE Temp:  [97.6 F (36.4 C)-99 F (37.2 C)] 98 F (36.7 C) (09/18 0900) Pulse Rate:  [83-106] 102 (09/18 0900) Cardiac Rhythm: Sinus tachycardia;Normal sinus rhythm (09/18 0800) Resp:  [10-20] 18 (09/18 0900) BP: (108-155)/(69-105) 108/69 (09/18 0900) SpO2:  [94 %-100 %] 95 % (09/18 0900)  Recent Labs  Lab 10/29/21 1648 10/29/21 2158 10/30/21 0638 10/30/21 0757 10/30/21 1220  GLUCAP 159* 266* 421* 516* 111*    Recent Labs  Lab 10/28/21 1140 10/28/21 1145 10/28/21 1311 10/30/21 0300  NA 126* 127* 127* 134*  K 4.7 4.4 4.5 5.4*  CL 101 99  --  101  CO2 14*  --   --  20*  GLUCOSE 545* 561*  --  305*  BUN 20 20  --  16  CREATININE 0.87 0.60  --  0.99  CALCIUM 8.4*  --   --  8.9    Recent Labs  Lab 10/28/21 1140  AST 45*  ALT 50*  ALKPHOS 190*  BILITOT 0.5  PROT 8.2*  ALBUMIN 3.1*    Recent Labs  Lab 10/28/21 1140 10/28/21 1145 10/28/21 1311 10/30/21 1004  WBC 6.7  --   --  7.4  NEUTROABS 3.3  --   --   --   HGB 11.1* 13.3 12.6 10.4*  HCT 35.4* 39.0 37.0 31.1*  MCV 94.9  --   --  88.9  PLT 419*  --   --  477*    No results for input(s): "CKTOTAL", "CKMB", "CKMBINDEX", "TROPONINI" in the last 168 hours. Recent Labs    10/28/21 1742  LABPROT 13.5  INR 1.0    Recent Labs    10/28/21 1355  COLORURINE STRAW*  LABSPEC 1.022  PHURINE 5.0  GLUCOSEU >=500*  HGBUR NEGATIVE  BILIRUBINUR NEGATIVE  KETONESUR NEGATIVE  PROTEINUR NEGATIVE  NITRITE NEGATIVE  LEUKOCYTESUR NEGATIVE        Component Value Date/Time   CHOL 99 10/29/2021 0048   TRIG 40 10/29/2021 0048   HDL 27 (L)  10/29/2021 0048   CHOLHDL 3.7 10/29/2021 0048   VLDL 8 10/29/2021 0048   LDLCALC 64 10/29/2021 0048   Lab Results  Component Value Date   HGBA1C 13.2 (H) 10/30/2021      Component Value Date/Time   LABOPIA NONE DETECTED 10/29/2021 1744   COCAINSCRNUR NONE DETECTED 10/29/2021 1744   COCAINSCRNUR NONE DETECTED 08/13/2021 1717   LABBENZ NONE DETECTED 10/29/2021 1744   AMPHETMU NONE DETECTED 10/29/2021 1744   THCU NONE DETECTED 10/29/2021 1744   LABBARB NONE DETECTED 10/29/2021 1744    Recent Labs  Lab 10/28/21 1140  ETH <10     I have personally reviewed the radiological images below and agree with the radiology interpretations.  Overnight EEG with video  Result Date: 10/30/2021 Lora Havens, MD     10/30/2021 10:21 AM Patient Name: Brittany [name] MRN: [id number] Epilepsy Attending: Lora Havens Referring Physician/Provider: Rosalin Hawking, MD Duration: 10/29/2021 1456 to 10/30/2021 1012 Patient history: 61 y.o. 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 EEG  to evaluate for seizure Level of alertness: Awake, asleep AEDs during EEG study: Ativan, LCM Technical aspects: This EEG study was done with scalp electrodes positioned according to the 10-20 International system of electrode placement. Electrical activity was reviewed with band pass filter of 1-'70Hz'$ , sensitivity of 7 uV/mm, display speed of 55m/sec with a '60Hz'$  notched filter applied as appropriate. EEG data were recorded continuously and digitally stored.  Video monitoring was available and reviewed as appropriate. Description: The posterior dominant rhythm consists of 9 Hz activity of moderate voltage (25-35 uV) seen predominantly in posterior head regions, asymmetric ( left<right) and reactive to eye opening and eye closing. Sleep was characterized by vertex waves, sleep spindles (12 to 14  Hz), maximal frontocentral region. EEG also showed intermittent 3 to 5 Hz theta -delta slowing. Spikes were noted in left temporal region  Hyperventilation and photic stimulation were not performed.   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 There is also cortical dysfunction arising from left temporal region likely secondary to underlying structural abnormality. No seizures were seen throughout the recording. PLora Havens  ECHOCARDIOGRAM COMPLETE  Result Date: 10/29/2021    ECHOCARDIOGRAM REPORT   Patient Name:   Brittany HUGHLEYDate of Exam: 10/29/2021 Medical Rec #:  [id number]    Height:       60.0 in Accession #:    [id number]   Weight:       123.9 lb Date of Birth:  1Jul 16, 1962   BSA:          1.523 m Patient Age:    89years      BP:           130/95 mmHg Patient Gender: F             HR:           86 bpm. Exam Location:  Inpatient Procedure: 2D Echo, Color Doppler and Cardiac Doppler Indications:    Stroke  History:        Patient has prior history of Echocardiogram examinations, most                 recent 11/06/2020. Risk Factors:Hypertension and Diabetes. Lupus.  Sonographer:    AClayton LefortRDCS (AE) Referring Phys: [place] 1. Left ventricular ejection fraction, by estimation, is 55 to 60%. The left ventricle has normal function. The left ventricle has no regional wall motion abnormalities. There is mild asymmetric left ventricular hypertrophy of the basal segment. Left ventricular diastolic parameters are consistent with Grade I diastolic dysfunction (impaired relaxation).  2. Right ventricular systolic function is low normal. The right ventricular size is normal. Tricuspid regurgitation signal is inadequate for assessing PA pressure.  3. The mitral valve is abnormal. Trivial mitral valve regurgitation.  Moderate mitral annular calcification.  4. The aortic valve is tricuspid. Aortic valve regurgitation is not visualized.  5. The inferior vena cava is normal in size with greater than 50% respiratory variability, suggesting right atrial pressure of 3 mmHg. Comparison(s): Prior images reviewed side by side. LVEF remains normal at 55-60%. FINDINGS  Left Ventricle: Left ventricular ejection fraction, by estimation, is 55 to 60%. The left ventricle has normal function. The left ventricle has no regional wall motion abnormalities. The left ventricular internal cavity size was normal in size. There is  mild  asymmetric left ventricular hypertrophy of the basal segment. Left ventricular diastolic parameters are consistent with Grade I diastolic dysfunction (impaired relaxation). Right Ventricle: The right ventricular size is normal. No increase in right ventricular wall thickness. Right ventricular systolic function is low normal. Tricuspid regurgitation signal is inadequate for assessing PA pressure. Left Atrium: Left atrial size was normal in size. Right Atrium: Right atrial size was normal in size. Pericardium: There is no evidence of pericardial effusion. Mitral Valve: The mitral valve is abnormal. There is mild thickening of the mitral valve leaflet(s). There is mild calcification of the mitral valve leaflet(s). Moderate mitral annular calcification. Trivial mitral valve regurgitation. Tricuspid Valve: The tricuspid valve is grossly normal. Tricuspid valve regurgitation is trivial. Aortic Valve: The aortic valve is tricuspid. There is mild aortic valve annular calcification. Aortic valve regurgitation is not visualized. Aortic valve mean gradient measures 1.0 mmHg. Aortic valve peak gradient measures 2.5 mmHg. Aortic valve area, by  VTI measures 4.08 cm. Pulmonic Valve: The pulmonic valve was grossly normal. Pulmonic valve regurgitation is trivial. Aorta: The aortic root is normal in size and structure. Venous: The  inferior vena cava is normal in size with greater than 50% respiratory variability, suggesting right atrial pressure of 3 mmHg. IAS/Shunts: No atrial level shunt detected by color flow Doppler.  LEFT VENTRICLE PLAX 2D LVIDd:         3.20 cm   Diastology LVIDs:         2.20 cm   LV e' medial:    6.09 cm/s LV PW:         1.10 cm   LV E/e' medial:  13.7 LV IVS:        1.20 cm   LV e' lateral:   5.98 cm/s LVOT diam:     2.30 cm   LV E/e' lateral: 14.0 LV SV:         47 LV SV Index:   31 LVOT Area:     4.15 cm  RIGHT VENTRICLE RV Basal diam:  2.40 cm RV S prime:     6.85 cm/s TAPSE (M-mode): 1.6 cm LEFT ATRIUM             Index        RIGHT ATRIUM          Index LA diam:        2.20 cm 1.44 cm/m   RA Area:     7.79 cm LA Vol (A2C):   12.7 ml 8.34 ml/m   RA Volume:   13.50 ml 8.86 ml/m LA Vol (A4C):   25.1 ml 16.48 ml/m LA Biplane Vol: 19.3 ml 12.67 ml/m  AORTIC VALVE AV Area (Vmax):    3.29 cm AV Area (Vmean):   3.18 cm AV Area (VTI):     4.08 cm AV Vmax:           78.40 cm/s AV Vmean:          53.100 cm/s AV VTI:            0.115 m AV Peak Grad:      2.5 mmHg AV Mean Grad:      1.0 mmHg LVOT Vmax:         62.00 cm/s LVOT Vmean:        40.600 cm/s LVOT VTI:          0.113 m LVOT/AV VTI ratio: 0.98  AORTA Ao Root diam: 3.30 cm Ao Asc diam:  3.20 cm MITRAL VALVE MV  Area (PHT): 4.96 cm     SHUNTS MV Decel Time: 153 msec     Systemic VTI:  0.11 m MV E velocity: 83.70 cm/s   Systemic Diam: 2.30 cm MV A velocity: 119.00 cm/s MV E/A ratio:  0.70 Rozann Lesches MD Electronically signed by Rozann Lesches MD Signature Date/Time: 10/29/2021/3:08:29 PM    Final    MR BRAIN WO CONTRAST  Result Date: 10/29/2021 CLINICAL DATA:  Acute neuro deficit. EXAM: MRI HEAD WITHOUT CONTRAST TECHNIQUE: Multiplanar, multiecho pulse sequences of the brain and surrounding structures were obtained without intravenous contrast. COMPARISON:  CT head 10/28/2021 FINDINGS: Brain: Limited MRI. The patient was not able to hold still and was  scanned in the lateral decubitus position. After several sequences, the patient left the scanner and the study was terminated. Diffusion-weighted imaging is diagnostic with mild motion. No acute infarct identified Chronic infarct left cerebellum and left occipital lobe as noted on prior studies. Chronic microvascular ischemic change also noted in the white matter. No hydrocephalus. IMPRESSION: Limited and incomplete study.  No acute infarct. Electronically Signed   By: Franchot Gallo M.D.   On: 10/29/2021 11:11   EEG adult  Result Date: 10/28/2021 Greta Doom, MD     10/28/2021  5:54 PM History: Concern for seizure Sedation: none Technique: This EEG was acquired with electrodes placed according to the International 10-20 electrode system (including Fp1, Fp2, F3, F4, C3, C4, P3, P4, O1, O2, T3, T4, T5, T6, A1, A2, Fz, Cz, Pz). The following electrodes were missing or displaced: none. Background: The background consists of intermixed alpha and beta activities. There is a well defined posterior dominant rhythm of 9 Hz that is better seen on the right than left. Sleep is not recorded. Photic stimulation: Physiologic driving is not performed EEG Abnormalities: 1) Assymetric PDR Clinical Interpretation: This EEG recorded evidence of a left posterior cortical dysfunction consistent with the patient's know parieto-occipital infarct. There was no seizure or seizure predisposition recorded on this study. Please note that lack of epileptiform activity on EEG does not preclude the possibility of epilepsy. Roland Rack, MD [facility] [phone] If 7pm- 7am, please page neurology on call as listed in [place].   DG Chest Portable 1 View  Result Date: 10/28/2021 CLINICAL DATA:  Altered level of consciousness EXAM: PORTABLE CHEST 1 VIEW COMPARISON:  09/21/2021 FINDINGS: Single frontal view of the chest demonstrates a stable cardiac silhouette. Chronic bibasilar interstitial prominence and linear  areas of consolidation, right greater than left. No new airspace disease, effusion, or pneumothorax. No acute bony abnormalities. IMPRESSION: 1. Chronic bibasilar interstitial prominence with scattered bibasilar consolidation, which has been stable since the 06/25/2021 exam. Findings are likely sequela of postinflammatory scarring or chronic aspiration pneumonia. 2. Otherwise unremarkable chest x-ray. Electronically Signed   By: Randa Ngo M.D.   On: 10/28/2021 15:02   CT ANGIO HEAD NECK W WO CM W PERF (CODE STROKE)  Result Date: 10/28/2021 CLINICAL DATA:  61 year old female code stroke presentation. Chronic infarcts. EXAM: CT ANGIOGRAPHY HEAD AND NECK TECHNIQUE: Multidetector CT imaging of the head and neck was performed using the standard protocol during bolus administration of intravenous contrast. Multiplanar CT image reconstructions and MIPs were obtained to evaluate the vascular anatomy. Carotid stenosis measurements (when applicable) are obtained utilizing NASCET criteria, using the distal internal carotid diameter as the denominator. CT Cerebral Perfusion also performed, with planned analysis by RAPID software, but the study is nondiagnostic due to patient motion. RADIATION DOSE REDUCTION: This exam was performed  according to the departmental dose-optimization program which includes automated exposure control, adjustment of the mA and/or kV according to patient size and/or use of iterative reconstruction technique. CONTRAST:  138m OMNIPAQUE IOHEXOL 350 MG/ML SOLN COMPARISON:  Plain head CT 1148 hours today. FINDINGS: CT Perfusion: Nondiagnostic due to pronounced patient motion. CTA NECK Skeleton: Cervical spine degeneration. No acute osseous abnormality identified. Upper chest: Centrilobular emphysema. Mild upper lung atelectasis. No superior mediastinal lymphadenopathy. Other neck: No neck mass or lymphadenopathy identified. Aortic arch: Calcified aortic atherosclerosis. Aberrant origin of the  right subclavian artery, 4 vessel arch configuration. Right carotid system: Mildly tortuous proximal right CCA. Right CCA and right ICA intermittent plaque with no significant stenosis to the skull base. Left carotid system: Mildly tortuous proximal left CCA. Less pronounced left carotid plaque in the neck with no stenosis to the skull base. Vertebral arteries: Aberrant origin proximal right subclavian artery with atherosclerosis but no significant stenosis. Right vertebral artery origin is normal. Right vertebral artery is non dominant and diminutive but patent to the skull base with no focal stenosis identified. Proximal left subclavian artery soft and calcified plaque without significant stenosis. Calcified plaque at the left vertebral artery origin but no significant stenosis. Tortuous left V1 segment. Dominant left vertebral artery is patent to the skull base with only mild stenosis. CTA HEAD Posterior circulation: Dominant left V4 segment. Patent PICA origins and no distal vertebral or vertebrobasilar junction stenosis. Patent basilar artery without stenosis. Patent SCA and PCA origins. Right posterior communicating artery is present. Smaller left posterior communicating artery is present. There is up to moderate irregularity of the left PCA with attenuated distal PCA branches, chronic infarct in that vascular territory. Right PCA branches are patent with only mild irregularity. Anterior circulation: Both ICA siphons are patent. Mild siphon plaque, no siphon stenosis. Normal posterior communicating artery origins. Patent carotid termini, MCA and ACA origins. Tortuous A1 segments. Anterior communicating artery and bilateral ACA branches are within normal limits. Right MCA M1 segment and bifurcation are patent with only mild tortuosity and irregularity. No right MCA branch occlusion. Left MCA M1 segment and bifurcation are patent with only mild irregularity. No left MCA branch occlusion is identified. Venous  sinuses: Early contrast timing, not well evaluated. Anatomic variants: Dominant left vertebral artery. Aberrant origin of the right subclavian artery. Fetal type right PCA origin. Review of the MIP images confirms the above findings IMPRESSION: 1. Negative for emergent large vessel occlusion. Nondiagnostic CT Perfusion due to patient motion. 2. Irregular and attenuated Left PCA branches compatible with chronic infarct in that territory. 3. Otherwise generally mild for age atherosclerosis in the head and neck. No other significant arterial stenosis identified. 4. More advanced Aortic Atherosclerosis (ICD10-I70.0). 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 5. Emphysema (ICD10-J43.9). Preliminary CTA results were discussed by telephone with Neurology Dr. SQuinn Axeon 10/28/2021 at 1214 hours. Electronically Signed   By: HGenevie AnnM.D.   On: 10/28/2021 12:35   CT HEAD CODE STROKE WO CONTRAST  Result Date: 10/28/2021 CLINICAL DATA:  Code stroke. 61year old female with history of multiple prior infarcts. New aphasia. Possible seizure activity. EXAM: CT HEAD WITHOUT CONTRAST TECHNIQUE: Contiguous axial images were obtained from the base of the skull through the vertex without intravenous contrast. RADIATION DOSE REDUCTION: This exam was performed according to the departmental dose-optimization program which includes automated exposure control, adjustment of the mA and/or kV according to patient size and/or use of iterative reconstruction technique. COMPARISON:  Head CT 09/21/2021. FINDINGS: Brain: Multifocal  chronic infarcts and encephalomalacia appears stable by CT since 09/21/2021. This includes involvement in the anterior right MCA territory, extensive left temporal and occipital lobe encephalomalacia, extensive left cerebellar encephalomalacia. Additional confluent bilateral cerebral white matter hypodensity including deep white matter capsule involvement. No acute intracranial hemorrhage identified. No  midline shift, mass effect, or evidence of intracranial mass lesion. No ventriculomegaly. No cortically based acute infarct identified. Vascular: Calcified atherosclerosis at the skull base. No suspicious intracranial vascular hyperdensity. Skull: No acute osseous abnormality identified. Sinuses/Orbits: Visualized paranasal sinuses and mastoids are stable and well aerated. Other: No acute orbit or scalp soft tissue finding. ASPECTS [facility] Stroke Program Early CT Score) Total score (0-10 with 10 being normal): 10, chronic encephalomalacia. IMPRESSION: 1. Stable CT appearance of advanced ischemic disease since last month. 2. No acute cortically based infarct or acute intracranial hemorrhage identified. ASPECTS 10. 3. Study discussed by telephone with Dr. Quinn Axe on 10/28/2021 at 1155 hours. Electronically Signed   By: Genevie Ann M.D.   On: 10/28/2021 11:58     PHYSICAL EXAM  Temp:  [97.6 F (36.4 C)-99 F (37.2 C)] 98 F (36.7 C) (09/18 0900) Pulse Rate:  [83-106] 102 (09/18 0900) Resp:  [10-20] 18 (09/18 0900) BP: (108-155)/(69-105) 108/69 (09/18 0900) SpO2:  [94 %-100 %] 95 % (09/18 0900)  General - Well nourished, well developed, agitated and combative, hitting RNs on both sides.  Ophthalmologic - fundi not visualized due to noncooperation.  Cardiovascular - Regular rhythm and rate.  Neuro - awake, alert, eyes open, agitated- but able to state year and name. Disoriented to place, time, situation, not cooperative with exam. No gaze palsy, tracking bilaterally. No facial droop. Moving all extremities strong equally, Sensation, coordination and gait not tested.   ASSESSMENT/PLAN Ms. SARENA JEZEK is a 61 y.o. 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  TNK given.  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 No seizures were seen throughout the recording. 2D Echo EF 55 to 60% LDL 64 HgbA1c 13.2 UDS negative SCDs for VTE prophylaxis aspirin 81 mg daily prior to admission, now on aspirin 81 mg daily.  Increased Vimpat to 100 twice daily Ongoing aggressive stroke risk factor management Therapy recommendations: Pending Disposition: Pending  History of seizure 03/2021 admitted for confusion, possible seizure and possible COVID.  EEG showed LPD with brief ictal discharge.  Long-term EEG showed left temporal epileptogenicity, discharged on Keppra 500 Per pharmacy, patient did not refill Keppra  History of stroke 07/2019 admitted for nausea vomiting, headache and generalized weakness.  Found to have DKA.  MRI showed left inferior cerebellum infarct.  LDL 153, A1c 13.5.  Carotid Doppler negative.  EF 55 to 60%. 10/2020 admitted for altered mental status, slurred speech, dysphagia and aphasia with right-sided mild weakness.  CT showed left PCA infarct.  MRI showed early subacute left PCA infarct, old bilateral BG lacunar infarct, left cerebellum old infarct.  Carotid Doppler negative, MRA negative.  EF 65 to 70%.  Discharged on DAPT.  Agitation and combative ? 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 ?  Postictal Continue Seroquel On vimpat On restrain Consult psychiatry  Diabetes, uncontrolled HgbA1c 13.2 goal < 7.0  Hyperglycemia on presentation, glucose 450 Uncontrolled Currently on Levemir CBG monitoring SSI with 3u Novolog with meals added DM education and close PCP follow up  Hypertension Home meds Cardizem 120 Stable Long term BP goal normotensive  Hyperlipidemia Home meds: Lipitor 40 LDL 64, goal < 70 Now on Lipitor 40 Continue statin at discharge  Tobacco abuse Current  smoker Smoking cessation counseling will be provided Pt is willing to quit  Other Stroke Risk Factors   Other Active Problems Lower back pain Discoid lupus 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 MD to update note as needed.   Janine Ores, DNP, FNP-BC [facility] Pager: [phone]  ATTENDING NOTE: I reviewed above note and agree with the assessment and plan. Pt was seen and examined.   No family at bedside.  Patient still in restraints, per RN, patient intermittent agitation and combative.  Long-term EEG overnight showed left temporal spikes consistent with history of focal seizure coming from left temporal lobe.  Vimpat increased to 100 mg twice daily.  Persistent hyperglycemia, diabetic coordinator on board, add Premeal insulin.  Will consider psychiatry consult, eventually will send pt back to [place].  For detailed assessment and plan, please refer to above/below as I have made changes wherever appropriate.   Rosalin Hawking, MD PhD Stroke Neurology 10/30/2021 5:57 PM    To contact Stroke Continuity provider, please refer to [email or web address] After hours, contact General Neurology

## 2021-10-30 NOTE — Progress Notes (Signed)
SLP Cancellation Note  Patient Details Name: DEBORHA MOSELEY MRN: [id number] DOB: Sep 15, 1960   Cancelled treatment:       Reason Eval/Treat Not Completed: Fatigue/lethargy limiting ability to participate; pt had been given Ativan prior to assessment impacting cognition.  ST will continue efforts.   Elvina Sidle, M.S., CCC-SLP 10/30/2021, 10:21 AM

## 2021-10-30 NOTE — Inpatient Diabetes Management (Signed)
Inpatient Diabetes Program Recommendations  AACE/ADA: New Consensus Statement on Inpatient Glycemic Control (2015)  Target Ranges:  Prepandial:   less than 140 mg/dL      Peak postprandial:   less than 180 mg/dL (1-2 hours)      Critically ill patients:  140 - 180 mg/dL   Lab Results  Component Value Date   GLUCAP 516 (HH) 10/30/2021   HGBA1C 13.2 (H) 10/30/2021    Review of Glycemic Control  Latest Reference Range & Units 10/29/21 11:25 10/29/21 16:48 10/29/21 21:58 10/30/21 06:38 10/30/21 07:57  Glucose-Capillary 70 - 99 mg/dL 135 (H) 159 (H) 266 (H) 421 (H) 516 (HH)   Diabetes history: DM1 (does NOT make any insulin; requires basal, correction, and carb coverage insulin) Outpatient Diabetes medications: Lantus 8 units daily, Humalog 0-9 units TID with meals and at HS Current orders for Inpatient glycemic control:  Novolog 0-15 units tid + hs Levemir 11 units qhs  A1c 13.2% on 9/18  Inpatient Diabetes Program Recommendations:    -   Consider Novolog 3 units tid meal coverage if eating >50% of meals.  Thanks,  Tama Headings RN, MSN, BC-ADM Inpatient Diabetes Coordinator Team Pager [phone] (8a-5p)

## 2021-10-30 NOTE — Progress Notes (Signed)
RN sent secure chat and made Dr. Cheral Marker aware CBG of 516 and that restraints need to be renewed shortly. MD acknowledged and gave order to renew restraints and to give insulin per sliding scale. No further orders received at this time.

## 2021-10-30 NOTE — Procedures (Addendum)
Patient Name: Brittany [name]  MRN: [id number]  Epilepsy Attending: Lora Havens  Referring Physician/Provider: Rosalin Hawking, MD  Duration: 10/29/2021 1456 to 10/30/2021 1012  Patient history: 61 y.o. 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 EEG to evaluate for seizure  Level of alertness: Awake, asleep  AEDs during EEG study: Ativan, LCM  Technical aspects: This EEG study was done with scalp electrodes positioned according to the 10-20 International system of electrode placement. Electrical activity was reviewed with band pass filter of 1-'70Hz'$ , sensitivity of 7 uV/mm, display speed of 75m/sec with a '60Hz'$  notched filter applied as appropriate. EEG data were recorded continuously and digitally stored.  Video monitoring was available and reviewed as appropriate.  Description: The posterior dominant rhythm consists of 9 Hz activity of moderate voltage (25-35 uV) seen predominantly in posterior head regions, asymmetric ( left<right) and reactive to eye opening and eye closing. Sleep was characterized by vertex waves, sleep spindles (12 to 14 Hz), maximal frontocentral region. EEG also showed intermittent 3 to 5 Hz theta -delta slowing. Spikes were noted in left temporal region  Hyperventilation and photic stimulation were not performed.     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 There is also cortical dysfunction arising from left temporal region likely secondary to underlying structural abnormality. No seizures were seen throughout the recording.  Jaysten Essner OBarbra Sarks

## 2021-10-30 NOTE — Progress Notes (Signed)
vLTM maintenance  All impedances below 10k.  Patient equipment moved to 3W-32  No skin breakdown noted at all skin sites.

## 2021-10-30 NOTE — Progress Notes (Signed)
OT Cancellation Note  Patient Details Name: MAHIKA VANVOORHIS MRN: [id number] DOB: 10/20/1960   Cancelled Treatment:    Reason Eval/Treat Not Completed: Fatigue/lethargy limiting ability to participate;Other (comment) (Pt very lethargic, despite max verbal stim, turning on lights, and tactile stim. Ativan given this AM. Will return as schedule allows.)  Lula Olszewski 10/30/2021, 3:01 PM

## 2021-10-31 DIAGNOSIS — I639 Cerebral infarction, unspecified: Secondary | ICD-10-CM | POA: Diagnosis not present

## 2021-10-31 DIAGNOSIS — R569 Unspecified convulsions: Secondary | ICD-10-CM | POA: Diagnosis not present

## 2021-10-31 LAB — BASIC METABOLIC PANEL
Anion gap: 7 (ref 5–15)
BUN: 20 mg/dL (ref 8–23)
CO2: 23 mmol/L (ref 22–32)
Calcium: 9.2 mg/dL (ref 8.9–10.3)
Chloride: 106 mmol/L (ref 98–111)
Creatinine, Ser: 0.6 mg/dL (ref 0.44–1.00)
GFR, Estimated: >60 mL/min (ref >60)
Glucose, Bld: 186 mg/dL — ABNORMAL HIGH (ref 70–99)
Potassium: 3.9 mmol/L (ref 3.5–5.1)
Sodium: 136 mmol/L (ref 135–145)

## 2021-10-31 LAB — GLUCOSE, CAPILLARY
Glucose-Capillary: 261 mg/dL — ABNORMAL HIGH (ref 70–99)
Glucose-Capillary: 235 mg/dL — ABNORMAL HIGH (ref 70–99)
Glucose-Capillary: 146 mg/dL — ABNORMAL HIGH (ref 70–99)
Glucose-Capillary: 195 mg/dL — ABNORMAL HIGH (ref 70–99)

## 2021-10-31 LAB — CBC
HCT: 32.3 % — ABNORMAL LOW (ref 36.0–46.0)
Hemoglobin: 10.4 g/dL — ABNORMAL LOW (ref 12.0–15.0)
MCH: 29.2 pg (ref 26.0–34.0)
MCHC: 32.2 g/dL (ref 30.0–36.0)
MCV: 90.7 fL (ref 80.0–100.0)
Platelets: 454 10*3/uL — ABNORMAL HIGH (ref 150–400)
RBC: 3.56 MIL/uL — ABNORMAL LOW (ref 3.87–5.11)
RDW: 15.3 % (ref 11.5–15.5)
WBC: 8.6 10*3/uL (ref 4.0–10.5)
nRBC: 0.5 % — ABNORMAL HIGH (ref 0.0–0.2)

## 2021-10-31 MED ORDER — OLANZAPINE 10 MG IM SOLR
2.5000 mg | Freq: Three times a day (TID) | INTRAMUSCULAR | Status: DC | PRN
  Administered 2021-11-04: 2.5 mg via INTRAMUSCULAR
  Filled 2021-10-31: qty 10

## 2021-10-31 MED ORDER — OLANZAPINE 2.5 MG PO TABS
2.5000 mg | ORAL_TABLET | Freq: Three times a day (TID) | ORAL | Status: DC | PRN
  Administered 2021-11-03 – 2021-11-06 (×4): 2.5 mg via ORAL
  Filled 2021-10-31 (×4): qty 1

## 2021-10-31 MED ORDER — OLANZAPINE 10 MG IM SOLR
5.0000 mg | Freq: Once | INTRAMUSCULAR | Status: AC | PRN
  Administered 2021-11-08: 5 mg via INTRAMUSCULAR
  Filled 2021-10-31: qty 10

## 2021-10-31 MED ORDER — DIVALPROEX SODIUM 250 MG PO DR TAB
250.0000 mg | DELAYED_RELEASE_TABLET | Freq: Two times a day (BID) | ORAL | Status: DC
  Administered 2021-10-31 – 2021-11-04 (×8): 250 mg via ORAL
  Filled 2021-10-31 (×8): qty 1

## 2021-10-31 NOTE — Evaluation (Signed)
Occupational Therapy Evaluation Patient Details Name: Brittany [name] MRN: [id number] DOB: 03-24-1960 Today's Date: 10/31/2021   History of Present Illness 61 yo female from ALF in [place] with onset of aphasia. pt with L temporal seizure due to hyperglycemia. CT and MRI (-) PMH DM, HTN lupus, prior PCA CVA, seizures, colostomy COPD pancreatic CA   Clinical Impression   PT admitted with seizure due to hyperglycemia. Pt currently with functional limitiations due to the deficits listed below (see OT problem list). Pt currently with cognitive deficits and balance deficits. Pt will need min(A) with transfers, supervision for meals to watch consumption and medication management. ALF will have to (A) at this level to return or require SNF level care. Aunt visiting in the room and reports pt grandmother is d/c today to [facility] in [place]. Pt did not change demeanor by information showing lack of understanding.  Pt will benefit from skilled OT to increase their independence and safety with adls and balance to allow discharge SNF.       Recommendations for follow up therapy are one component of a multi-disciplinary discharge planning process, led by the attending physician.  Recommendations may be updated based on patient status, additional functional criteria and insurance authorization.   Follow Up Recommendations  Skilled nursing-short term rehab (<3 hours/day)    Assistance Recommended at Discharge Frequent or constant Supervision/Assistance  Patient can return home with the following A little help with walking and/or transfers;A little help with bathing/dressing/bathroom;Direct supervision/assist for medications management;Assist for transportation    Functional Status Assessment  Patient has had a recent decline in their functional status and demonstrates the ability to make significant improvements in function in a reasonable and predictable amount of time.   Equipment Recommendations  BSC/3in1;Other (comment) (RW)    Recommendations for Other Services       Precautions / Restrictions Precautions Precautions: Fall Precaution Comments: wrist restraints, posey belt, ostomy Restrictions Weight Bearing Restrictions: No      Mobility Bed Mobility Overal bed mobility: Needs Assistance Bed Mobility: Rolling, Supine to Sit Rolling: Min guard   Supine to sit: Min guard     General bed mobility comments: Pt long sitting in the posey belt in the bed. pt cued to return to supine and d/c the restraints. pt with Min (A) to come back to long sitting.    Transfers Overall transfer level: Needs assistance Equipment used: 2 person hand held assist Transfers: Sit to/from Stand Sit to Stand: Min assist           General transfer comment: pt with posterior lean with sit<>stand      Balance Overall balance assessment: Mild deficits observed, not formally tested                                         ADL either performed or assessed with clinical judgement   ADL Overall ADL's : Needs assistance/impaired Eating/Feeding: Set up;Sitting   Grooming: Wash/dry hands;Minimal assistance;Sitting Grooming Details (indicate cue type and reason): mod (A) for support for balance             Lower Body Dressing: Maximal assistance Lower Body Dressing Details (indicate cue type and reason): don new mesh panties Toilet Transfer: +2 for physical assistance;Minimal assistance;Ambulation;BSC/3in1 Toilet Transfer Details (indicate cue type and reason): pt lead to bathroom and voiding bladder on commode appropriately. pt noted to have wet  pad on without awareness. pt with some skin moist concerns at buttock. RN informed Toileting- Water quality scientist and Hygiene: Minimal assistance;Sit to/from stand Toileting - Clothing Manipulation Details (indicate cue type and reason): requries mod (A) to pull up mesh panties but total (A) to  don over feet     Functional mobility during ADLs: +2 for physical assistance;Minimal assistance       Vision   Additional Comments: scanning tray appropriately to locate all food and consume     Perception     Praxis      Pertinent Vitals/Pain Pain Assessment Pain Assessment: No/denies pain     Hand Dominance Right   Extremity/Trunk Assessment Upper Extremity Assessment Upper Extremity Assessment: Overall WFL for tasks assessed   Lower Extremity Assessment Lower Extremity Assessment: Defer to PT evaluation   Cervical / Trunk Assessment Cervical / Trunk Assessment: Normal   Communication Communication Communication: No difficulties   Cognition Arousal/Alertness: Awake/alert Behavior During Therapy: Flat affect Overall Cognitive Status: Impaired/Different from baseline Area of Impairment: Orientation, Attention, Memory, Following commands, Safety/judgement, Awareness, Problem solving                 Orientation Level: Disoriented to, Place, Time, Situation Current Attention Level: Sustained Memory: Decreased recall of precautions, Decreased short-term memory Following Commands: Follows one step commands consistently Safety/Judgement: Decreased awareness of deficits, Decreased awareness of safety Awareness: Intellectual Problem Solving: Slow processing General Comments: pt recognized aunt that arrived briefly in room. pt eating breakfast and asking for food that was already consumed. pt redirected to container that is now empty. pt asking for additional food and PT calling dietary to request additional food. Pt needs redirection to adl task and closing eyes at sink. pt reports "sometimes I just do that"     General Comments  noted to have maceration at buttock    Exercises     Shoulder Instructions      Home Living Family/patient expects to be discharged to:: Skilled nursing facility                                        Prior  Functioning/Environment Prior Level of Function : Needs assist             Mobility Comments: From prior admission: Has SPC and RW, normally ambulates without AD ADLs Comments: Able to manage dressing, toileting, grooming, showering independently. Needs assistance with medication, diabetes management and colostomy care        OT Problem List: Decreased strength;Decreased activity tolerance;Impaired balance (sitting and/or standing);Decreased cognition;Decreased coordination;Decreased knowledge of use of DME or AE;Decreased knowledge of precautions      OT Treatment/Interventions: Self-care/ADL training;Therapeutic exercise;Energy conservation;DME and/or AE instruction;Manual therapy;Modalities;Therapeutic activities;Cognitive remediation/compensation;Patient/family education;Balance training    OT Goals(Current goals can be found in the care plan section) Acute Rehab OT Goals Patient Stated Goal: to get pancakes OT Goal Formulation: Patient unable to participate in goal setting Time For Goal Achievement: 11/13/21 Potential to Achieve Goals: Good  OT Frequency: Min 2X/week    Co-evaluation   Reason for Co-Treatment: Necessary to address cognition/behavior during functional activity;For patient/therapist safety;To address functional/ADL transfers PT goals addressed during session: Mobility/safety with mobility;Balance        AM-PAC OT "6 Clicks" Daily Activity     Outcome Measure Help from another person eating meals?: A Little Help from another person taking care of personal grooming?: A Little Help  from another person toileting, which includes using toliet, bedpan, or urinal?: A Lot Help from another person bathing (including washing, rinsing, drying)?: A Lot Help from another person to put on and taking off regular upper body clothing?: A Little Help from another person to put on and taking off regular lower body clothing?: A Lot 6 Click Score: 15   End of Session Nurse  Communication: Mobility status;Precautions  Activity Tolerance: Patient tolerated treatment well Patient left: in bed;with call bell/phone within reach;with bed alarm set;with restraints reapplied  OT Visit Diagnosis: Unsteadiness on feet (R26.81);Muscle weakness (generalized) (M62.81)                Time: 1916-6060 OT Time Calculation (min): 30 min Charges:  OT General Charges $OT Visit: 1 Visit OT Evaluation $OT Eval Moderate Complexity: 1 Mod   Brynn, OTR/L  Acute Rehabilitation Services Office: [phone] .   Jeri Modena 10/31/2021, 12:47 PM

## 2021-10-31 NOTE — TOC Progression Note (Signed)
Transition of Care [facility]) - Progression Note    Patient Details  Name: SHAMIRAH IVAN MRN: [id number] Date of Birth: [birth year]  Transition of Care [facility]) CM/SW [place], [place] Phone Number: 10/31/2021, 3:43 PM  Clinical Narrative:   CSW spoke with Lajoyce Lauber at [facility] to discuss patient's care needs. CSW faxed PT and OT notes over to [facility] for them to review and determine if patient can return or will need SNF. CSW to follow.    Expected Discharge Plan: Assisted Living Barriers to Discharge: Continued Medical Work up  Expected Discharge Plan and Services Expected Discharge Plan: Assisted Living       Living arrangements for the past 2 months: [place], Single Family Home                                       Social Determinants of Health (SDOH) Interventions    Readmission Risk Interventions    09/22/2021    1:24 PM 03/06/2021    9:13 AM 02/16/2021    2:45 PM  Readmission Risk Prevention Plan  Transportation Screening Complete Complete Complete  Medication Review Press photographer) Complete Complete Complete  PCP or Specialist appointment within 3-5 days of discharge Complete Complete   HRI or [place] Complete    SW Recovery Care/Counseling Consult Complete  Complete  Palliative Care Screening Not Applicable Not Applicable Not [place] Complete Not Applicable Not Applicable
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
## 2021-10-31 NOTE — Consult Note (Signed)
Note: this is late documentation for an encounter on 9/19; have made no edits to body of note today.   [facility] New Face-to-Face Psychiatric Evaluation   Service Date: October 31, 2021 LOS:  LOS: 3 days    Assessment  Brittany [name] is a 61 y.o. female admitted medically for 10/28/2021 11:33 AM for acute aphasia; admitted for anticoagulation (TNK) due to suspected stroke. She carries the psychiatric diagnoses of depression and anxiety and has a complex past medical history of colostomy, COPD, DM, lupus, pancreatic cancer, RLS, seizure disorder. She has had several recent admissions for encephalopathy. Psychiatry was consulted for agitation by Brittany Ores, MD.   When examined by psychiatry consult service, pt was alert and oriented to self and grossly to situation. She demonstrated poor attention both throughout the exam and on formal testing. There was no agitation at time of interview; this is in stark contrast to information obtained from primary team and chart review. Overall most c/w delirium due to waxing and waning nature of symptoms.    At this time, the patient's presentation is most consistent with delirium, most likely due to multiple etiologies including but not limited to seizures, infection, medications, pain, altered sleep/wake cycle, and limited mobility. The patient would strongly benefit from medical treatment of seizure disorder. During this time period, minimization of delirogenic insults will be of utmost importance; this includes promoting the normal circadian cycle, minimizing lines/tubes, avoiding deliriogenic medications such as benzodiazepines and anticholinergic medications, and frequently reorienting the patient. Symptomatic treatment for agitation can be provided by antipsychotic medications, though it is important to remember that these do not treat the underlying etiology of delirium. Notably, there can be a time lag effect between treatment of a  medical problem and resolution of delirium. This time lag effect may be of longer duration in the elderly, and those with underlying cognitive impairment or brain injury. Would assume that Brittany [name] is at risk for longer durations of delirium given multiple recent episodes  Diagnoses:  Active Hospital problems: Principal Problem:   Acute ischemic stroke [facility]) Active Problems:   Seizure ([place])     Plan  ## Safety and Observation Level:  - Based on my clinical evaluation, I estimate the patient to be at moderate risk of self harm in the current setting due to decreased safety awareness; defer level of observation to primary team.   ## Medications:  --  STOP lorazepam for agitation -- START olanzapine 2.5 mg TID PRN PO/IM backup for agitation -- START divalproex 250 mg BID for agitation, adjunctive seizure control (in conjunction w/ neuro)    ## Medical Decision Making Capacity:  Not formally assessed. Will likely wax and wane with overall sensorium.   ## Further Work-up:  -- None currently   -- most recent EKG on 9/16 had QtC of 464 -- Pertinent labwork reviewed earlier this admission includes: hypoalbumineima on CMP  ## Disposition:  -- per primary. No psychiatric contraindication to dc when medically appropriate  ## Behavioral / Environmental:  -- Putting in delirium precautions    Thank you for this consult request. Recommendations have been communicated to the primary team.  We will continue to follow at this time.   Brittany [name]   New history  Relevant Aspects of Hospital Course:  Admitted on 10/28/2021 for stroke r/o; received anticoagulatoin. Hospitalization marked by encephalopathy and agitation, currently in lap and wrist restraints. Has gotten multiple doses of lorazepam, seems to be oversedating based on chart  review.   Patient Report:  Pt seen in late AM. She is oriented to self and location; she knows the month and the year. Has difficulty with why  she is in the hospital (some variant of "to see psychiatry"). She frequently requires questions to be repeated 2-3 times to get an accurate answer - eg "Where are you right now" --> "September" and answers anticipated rather than actual question. She is intent on eating her lunch and needs to be redirected to interview multiple times. She briefly confirms psych history below.   ROS:  Devoid of SI, HI, AH/VH.   Collateral information:  Called sister Brittany [name] with pt's permission; VM not set up.   Psychiatric History:  Information collected from pt chart Psych hx mostly c/w depression, anxiety, "anger" prior to string of hospitalizations for encephalopathy. Unclear if she has been hospitalized previously for psych reasons (not to my knowledge)  Previous Meds for depression: Celexa '10mg'$  (failed) Zoloft '25mg'$ , Wellbutrin XL '300mg'$ , Effexor XR '150mg'$  (failed), Prozac '20mg'$ Trazodone '25mg'$  QHS PRN  Family psych history: at least one sister with anxiety disorder   Social History:  From prior notes:   Smokes 1/1 ppd. No EtOH. No marijuana - some years and years ago. No cociane, heroin, meth.   Family History:  The patient's family history includes Anxiety disorder in her sister; Breast cancer in her maternal aunt.  Medical History: Past Medical History:  Diagnosis Date   C. difficile colitis 02/17/2021   Colostomy in place [facility])    h/o diverticulitis with abscess   Complicated grief 7/84/6962   COPD, mild ([place]) 09/16/2015   PFTs on 06/15/15 with FEV1/FVC ratio of 64%, FEV1 83%, DLCO 47%   Diabetes mellitus without complication (HCC)    Dyslipidemia    History of Clostridium difficile colitis 03/03/2021   Hyperosmolar hyperglycemic state (HHS) ([place]) 11/18/2020   Hypertension    Hypokalemia; hyperkalemia 07/12/2019   Lactic acidosis 11/19/2020   Lupus (HCC)    Pancreatic cancer (HCC)    RLS (restless legs syndrome)    Seizure disorder (HCC)    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[place])  07/17/2019    Surgical History: Past Surgical History:  Procedure Laterality Date   COLECTOMY WITH COLOSTOMY CREATION/HARTMANN PROCEDURE N/A 03/10/2021   Procedure: COLECTOMY WITH COLOSTOMY CREATION/HARTMANN PROCEDURE;  Surgeon: Jules Husbands, MD;  Location: [facility] ORS;  Service: General;  Laterality: N/A;   PANCREATECTOMY     spleenectomy      Medications:   Current Facility-Administered Medications:    acetaminophen (TYLENOL) tablet 650 mg, 650 mg, Oral, Q4H PRN **OR** acetaminophen (TYLENOL) 160 MG/5ML solution 650 mg, 650 mg, Per Tube, Q4H PRN **OR** acetaminophen (TYLENOL) suppository 650 mg, 650 mg, Rectal, Q4H PRN, Derek Jack, MD   aspirin EC tablet 81 mg, 81 mg, Oral, Daily, Rosalin Hawking, MD, 81 mg at 10/31/21 1103   atorvastatin (LIPITOR) tablet 40 mg, 40 mg, Oral, Nightly, Derek Jack, MD, 40 mg at 10/30/21 2046   Chlorhexidine Gluconate Cloth 2 % PADS 6 each, 6 each, Topical, Daily, Derek Jack, MD, 6 each at 10/31/21 1103   clevidipine (CLEVIPREX) infusion 0.5 mg/mL, 0-21 mg/hr, Intravenous, Continuous, Derek Jack, MD, Held at 10/28/21 1618   famotidine (PEPCID) tablet 20 mg, 20 mg, Oral, Daily, Derek Jack, MD, 20 mg at 10/31/21 1103   hydrOXYzine (ATARAX) tablet 10 mg, 10 mg, Oral, TID PRN, Brittany Ores, NP, 10 mg at 10/30/21 1805   insulin aspart (novoLOG)  injection 0-15 Units, 0-15 Units, Subcutaneous, TID WC, Kerney Elbe, MD, 5 Units at 10/31/21 1258   insulin aspart (novoLOG) injection 0-5 Units, 0-5 Units, Subcutaneous, QHS, Kerney Elbe, MD, 3 Units at 10/30/21 2208   insulin aspart (novoLOG) injection 3 Units, 3 Units, Subcutaneous, TID WC, Shafer, Devon, NP, 3 Units at 10/31/21 1259   insulin detemir (LEVEMIR) injection 11 Units, 11 Units, Subcutaneous, QHS, Derek Jack, MD, 11 Units at 10/30/21 2134   lacosamide (VIMPAT) tablet 100 mg, 100 mg, Oral, BID, Shafer, Devon, NP, 100 mg at 10/31/21 1103   mirtazapine (REMERON) tablet 15 mg,  15 mg, Oral, QHS, Derek Jack, MD, 15 mg at 10/30/21 2046   OLANZapine (ZYPREXA) injection 5 mg, 5 mg, Intramuscular, Once PRN, Brittany Ores, NP   Oral care mouth rinse, 15 mL, Mouth Rinse, 4 times per day, Derek Jack, MD, 15 mL at 10/31/21 1259   Oral care mouth rinse, 15 mL, Mouth Rinse, PRN, Derek Jack, MD   QUEtiapine (SEROQUEL) tablet 25 mg, 25 mg, Oral, QHS, Derek Jack, MD, 25 mg at 10/30/21 2045   rOPINIRole (REQUIP) tablet 0.5 mg, 0.5 mg, Oral, QHS, Derek Jack, MD, 0.5 mg at 10/30/21 2045   senna-docusate (Senokot-S) tablet 1 tablet, 1 tablet, Oral, QHS PRN, Derek Jack, MD   zinc sulfate capsule 220 mg, 220 mg, Oral, Daily, Derek Jack, MD, 220 mg at 10/31/21 1103  Allergies: Allergies  Allergen Reactions   Cephalosporins Itching    TOLERATED ZOSYN (PIPERACILLIN) BEFORE   Hydromorphone Itching   Keflin [Cephalothin] Itching   Lactose Intolerance (Gi) Diarrhea       Objective  Vital signs:  Temp:  [97.9 F (36.6 C)-98.5 F (36.9 C)] 98 F (36.7 C) (09/19 0813) Pulse Rate:  [94-103] 102 (09/19 0813) Resp:  [17-18] 18 (09/19 0813) BP: (106-128)/(73-89) 106/73 (09/19 0813) SpO2:  [93 %-99 %] 93 % (09/19 0813)  Psychiatric Specialty Exam:  Presentation  General Appearance: Appropriate for Environment  Eye Contact:Good  Speech:Clear and Coherent  Speech Volume:Normal  Handedness:No data recorded  Mood and Affect  Mood:Euthymic (mildy anxious. Wants food)  Affect:Appropriate   Thought Process  Thought Processes:Coherent  Descriptions of Associations:Intact  Orientation:Full (Time, Place and Person)  Thought Content:Logical  History of Schizophrenia/Schizoaffective disorder:No data recorded Duration of Psychotic Symptoms:No data recorded Hallucinations:No data recorded Ideas of Reference:None  Suicidal Thoughts:No data recorded Homicidal Thoughts:No data recorded  Sensorium  Memory:Immediate  Fair  Judgment:Fair  Insight:Fair   Executive Functions  Concentration:Fair  Attention Span:Fair  [place]   Psychomotor Activity  Psychomotor Activity:No data recorded  Assets  Assets:Financial Resources/Insurance; Desire for Improvement; Resilience   Sleep  Sleep:No data recorded   Physical Exam: Physical Exam ROS Blood pressure 106/73, pulse (!) 102, temperature 98 F (36.7 C), temperature source Oral, resp. rate 18, weight 56.2 kg, SpO2 93 %. Body mass index is 24.2 kg/m.

## 2021-10-31 NOTE — Plan of Care (Signed)
  Problem: Education: Goal: Ability to describe self-care measures that may prevent or decrease complications (Diabetes Survival Skills Education) will improve Outcome: Not Progressing Goal: Individualized Educational Video(s) Outcome: Not Progressing   Problem: Coping: Goal: Ability to adjust to condition or change in health will improve Outcome: Not Progressing   Problem: Fluid Volume: Goal: Ability to maintain a balanced intake and output will improve Outcome: Not Progressing   Problem: Health Behavior/Discharge Planning: Goal: Ability to identify and utilize available resources and services will improve Outcome: Not Progressing Goal: Ability to manage health-related needs will improve Outcome: Not Progressing   Problem: Metabolic: Goal: Ability to maintain appropriate glucose levels will improve Outcome: Not Progressing   Problem: Nutritional: Goal: Maintenance of adequate nutrition will improve Outcome: Not Progressing Goal: Progress toward achieving an optimal weight will improve Outcome: Not Progressing   Problem: Skin Integrity: Goal: Risk for impaired skin integrity will decrease Outcome: Not Progressing   Problem: Tissue Perfusion: Goal: Adequacy of tissue perfusion will improve Outcome: Not Progressing   Problem: Safety: Goal: Non-violent Restraint(s) Outcome: Not Progressing   Problem: Education: Goal: Knowledge of General Education information will improve Description: Including pain rating scale, medication(s)/side effects and non-pharmacologic comfort measures Outcome: Not Progressing   Problem: Health Behavior/Discharge Planning: Goal: Ability to manage health-related needs will improve Outcome: Not Progressing   Problem: Clinical Measurements: Goal: Ability to maintain clinical measurements within normal limits will improve Outcome: Not Progressing Goal: Will remain free from infection Outcome: Not Progressing Goal: Diagnostic test results will  improve Outcome: Not Progressing Goal: Respiratory complications will improve Outcome: Not Progressing Goal: Cardiovascular complication will be avoided Outcome: Not Progressing   Problem: Activity: Goal: Risk for activity intolerance will decrease Outcome: Not Progressing   Problem: Nutrition: Goal: Adequate nutrition will be maintained Outcome: Not Progressing   Problem: Coping: Goal: Level of anxiety will decrease Outcome: Not Progressing   Problem: Elimination: Goal: Will not experience complications related to bowel motility Outcome: Not Progressing Goal: Will not experience complications related to urinary retention Outcome: Not Progressing   Problem: Pain Managment: Goal: General experience of comfort will improve Outcome: Not Progressing   Problem: Safety: Goal: Ability to remain free from injury will improve Outcome: Not Progressing   Problem: Skin Integrity: Goal: Risk for impaired skin integrity will decrease Outcome: Not Progressing   Problem: Education: Goal: Knowledge of disease or condition will improve Outcome: Not Progressing Goal: Knowledge of secondary prevention will improve (SELECT ALL) Outcome: Not Progressing Goal: Knowledge of patient specific risk factors will improve (INDIVIDUALIZE FOR PATIENT) Outcome: Not Progressing Goal: Individualized Educational Video(s) Outcome: Not Progressing   Problem: Coping: Goal: Will verbalize positive feelings about self Outcome: Not Progressing Goal: Will identify appropriate support needs Outcome: Not Progressing   Problem: Health Behavior/Discharge Planning: Goal: Ability to manage health-related needs will improve Outcome: Not Progressing   Problem: Ischemic Stroke/TIA Tissue Perfusion: Goal: Complications of ischemic stroke/TIA will be minimized Outcome: Not Progressing

## 2021-10-31 NOTE — Evaluation (Signed)
Physical Therapy Evaluation  Patient Details Name: Brittany [name] MRN: [id number] DOB: [birth year] Today's Date: 10/31/2021  History of Present Illness  61 yo female from ALF in [place] with onset of aphasia. pt with L temporal seizure due to hyperglycemia. CT and MRI (-) PMH DM, HTN lupus, prior PCA CVA, seizures, colostomy COPD pancreatic CA   Clinical Impression  Pt admitted with above diagnosis. Pt currently with functional limitations due to the deficits listed below (see PT Problem List). At the time of PT eval pt was able to perform transfers and ambulation with up to +2 min assist mainly for safety, but feel she can safely be managed with +1 assist. If ALF if able to accommodate current assist level, pt appropriate for return to ALF setting. If not, will require SNF level rehab. Acutely, pt will benefit from skilled PT to increase their independence and safety with mobility to allow discharge to the venue listed below.        Recommendations for follow up therapy are one component of a multi-disciplinary discharge planning process, led by the attending physician.  Recommendations may be updated based on patient status, additional functional criteria and insurance authorization.  Follow Up Recommendations Home health PT (If facility can manage assist level. If not, will need SNF.)      Assistance Recommended at Discharge Frequent or constant Supervision/Assistance  Patient can return home with the following  A little help with walking and/or transfers;A little help with bathing/dressing/bathroom;Assistance with cooking/housework;Direct supervision/assist for medications management;Direct supervision/assist for financial management;Assist for transportation;Help with stairs or ramp for entrance    Equipment Recommendations    Recommendations for Other Services       Functional Status Assessment Patient has had a recent decline in their functional status and demonstrates the  ability to make significant improvements in function in a reasonable and predictable amount of time.     Precautions / Restrictions Precautions Precautions: Fall Precaution Comments: wrist restraints, posey belt, ostomy Restrictions Weight Bearing Restrictions: No      Mobility  Bed Mobility Overal bed mobility: Needs Assistance Bed Mobility: Supine to Sit     Supine to sit: Min guard     General bed mobility comments: Increased time and cues to scoot all the way out to EOB. Able to maintain sitting balance well without assist.    Transfers Overall transfer level: Needs assistance Equipment used: 1 person hand held assist, 2 person hand held assist Transfers: Sit to/from Stand, Bed to chair/wheelchair/BSC Sit to Stand: Min assist   Step pivot transfers: Min assist, +2 safety/equipment       General transfer comment: Min assist to power up to full stand. Initially, poor standing balance with posterior lean. On following attempts posterior lean was improved.    Ambulation/Gait Ambulation/Gait assistance: Min assist, +2 safety/equipment Gait Distance (Feet): 25 Feet Assistive device: 2 person hand held assist Gait Pattern/deviations: Decreased stride length, Shuffle, Wide base of support Gait velocity: Decreased Gait velocity interpretation: <1.8 ft/sec, indicate of risk for recurrent falls   General Gait Details: Low floor clearance and dificulty navigating room. Therapists guiding pt to bathroom, to sink, and back to bed.  Stairs            Wheelchair Mobility    Modified Rankin (Stroke Patients Only)       Balance Overall balance assessment: Needs assistance Sitting-balance support: Feet supported, No upper extremity supported Sitting balance-Leahy Scale: Fair   Postural control: Posterior lean Standing balance support: No  upper extremity supported, During functional activity Standing balance-Leahy Scale: Poor                                Pertinent Vitals/Pain Pain Assessment Pain Assessment: No/denies pain    Home Living Family/patient expects to be discharged to:: Skilled nursing facility                        Prior Function Prior Level of Function : Needs assist             Mobility Comments: From prior admission: Has SPC and RW, normally ambulates without AD ADLs Comments: Able to manage dressing, toileting, grooming, showering independently. Needs assistance with medication, diabetes management and colostomy care     Hand Dominance   Dominant Hand: Right    Extremity/Trunk Assessment   Upper Extremity Assessment Upper Extremity Assessment: Overall WFL for tasks assessed    Lower Extremity Assessment Lower Extremity Assessment: Defer to PT evaluation    Cervical / Trunk Assessment Cervical / Trunk Assessment: Normal  Communication   Communication: No difficulties  Cognition Arousal/Alertness: Awake/alert Behavior During Therapy: Restless, Impulsive Overall Cognitive Status: Impaired/Different from baseline Area of Impairment: Orientation, Attention, Memory, Safety/judgement, Following commands, Awareness, Problem solving                 Orientation Level: Disoriented to, Place, Time, Situation Current Attention Level: Selective Memory: Decreased recall of precautions, Decreased short-term memory Following Commands: Follows one step commands consistently, Follows one step commands with increased time Safety/Judgement: Decreased awareness of safety, Decreased awareness of deficits Awareness: Intellectual Problem Solving: Slow processing, Decreased initiation, Difficulty sequencing, Requires verbal cues, Requires tactile cues          General Comments General comments (skin integrity, edema, etc.): noted to have maceration at buttock    Exercises     Assessment/Plan    PT Assessment Patient needs continued PT services  PT Problem List Decreased strength;Decreased  range of motion;Decreased activity tolerance;Decreased balance;Decreased mobility;Decreased cognition;Decreased knowledge of use of DME;Decreased knowledge of precautions;Decreased safety awareness       PT Treatment Interventions DME instruction;Gait training;Functional mobility training;Therapeutic activities;Therapeutic exercise;Balance training;Cognitive remediation;Patient/family education    PT Goals (Current goals can be found in the Care Plan section)  Acute Rehab PT Goals Patient Stated Goal: Pt motivated for more food. No real goals stated during session. PT Goal Formulation: Patient unable to participate in goal setting Time For Goal Achievement: 11/07/21 Potential to Achieve Goals: Good    Frequency Min 2X/week     Co-evaluation PT/OT/SLP Co-Evaluation/Treatment: Yes Reason for Co-Treatment: Necessary to address cognition/behavior during functional activity;For patient/therapist safety;To address functional/ADL transfers PT goals addressed during session: Mobility/safety with mobility;Balance         AM-PAC PT "6 Clicks" Mobility  Outcome Measure Help needed turning from your back to your side while in a flat bed without using bedrails?: None Help needed moving from lying on your back to sitting on the side of a flat bed without using bedrails?: A Little Help needed moving to and from a bed to a chair (including a wheelchair)?: A Little Help needed standing up from a chair using your arms (e.g., wheelchair or bedside chair)?: A Little Help needed to walk in hospital room?: A Little Help needed climbing 3-5 steps with a railing? : A Little 6 Click Score: 19    End of Session Equipment Utilized During  Treatment: Gait belt Activity Tolerance: Patient tolerated treatment well Patient left: in bed;with call bell/phone within reach;with bed alarm set;with restraints reapplied Nurse Communication: Mobility status PT Visit Diagnosis: Unsteadiness on feet (R26.81);Other  abnormalities of gait and mobility (R26.89);Difficulty in walking, not elsewhere classified (R26.2)    Time: 1010-1048 PT Time Calculation (min) (ACUTE ONLY): 38 min   Charges:   PT Evaluation $PT Eval Moderate Complexity: 1 Mod PT Treatments $Gait Training: 8-22 mins        Rolinda Roan, PT, DPT Acute Rehabilitation Services Secure Chat Preferred Office: [phone]   Thelma Comp 10/31/2021, 12:57 PM

## 2021-10-31 NOTE — Progress Notes (Addendum)
STROKE TEAM PROGRESS NOTE   SUBJECTIVE (INTERVAL HISTORY) Seen in room, currently requiring bilateral wrist restraints and Brittany chest belt for safety. Plan to d/c back to assisted living [facility] if possible), TOC consulted for help with discharge planning LTM off, increased vimpat to '100mg'$  BID.  Psych consult for medication management. They plan to start depakote, recommend no benzos- use zyprexa instead.   OBJECTIVE Temp:  [97.9 F (36.6 C)-98.5 F (36.9 C)] 98 F (36.7 C) (09/19 0813) Pulse Rate:  [94-103] 102 (09/19 0813) Cardiac Rhythm: Sinus tachycardia (09/19 0730) Resp:  [17-18] 18 (09/19 0813) BP: (106-128)/(73-89) 106/73 (09/19 0813) SpO2:  [93 %-99 %] 93 % (09/19 0813)  Recent Labs  Lab 10/30/21 1220 10/30/21 1608 10/30/21 2158 10/31/21 0634 10/31/21 1222  GLUCAP 111* 245* 277* 261* 235*    Recent Labs  Lab 10/28/21 1140 10/28/21 1145 10/28/21 1311 10/30/21 0300 10/30/21 1436 10/31/21 0301  NA 126* 127* 127* 134* 135 136  K 4.7 4.4 4.5 5.4* 4.5 3.9  CL 101 99  --  101 105 106  CO2 14*  --   --  20* 23 23  GLUCOSE 545* 561*  --  305* 201* 186*  BUN 20 20  --  '16 21 20  '$ CREATININE 0.87 0.60  --  0.99 0.86 0.60  CALCIUM 8.4*  --   --  8.9 8.8* 9.2    Recent Labs  Lab 10/28/21 1140  AST 45*  ALT 50*  ALKPHOS 190*  BILITOT 0.5  PROT 8.2*  ALBUMIN 3.1*    Recent Labs  Lab 10/28/21 1140 10/28/21 1145 10/28/21 1311 10/30/21 1004 10/31/21 0301  WBC 6.7  --   --  7.4 8.6  NEUTROABS 3.3  --   --   --   --   HGB 11.1* 13.3 12.6 10.4* 10.4*  HCT 35.4* 39.0 37.0 31.1* 32.3*  MCV 94.9  --   --  88.9 90.7  PLT 419*  --   --  477* 454*    No results for input(s): "CKTOTAL", "CKMB", "CKMBINDEX", "TROPONINI" in the last 168 hours. Recent Labs    10/28/21 1742  LABPROT 13.5  INR 1.0    No results for input(s): "COLORURINE", "LABSPEC", "PHURINE", "GLUCOSEU", "HGBUR", "BILIRUBINUR", "KETONESUR", "PROTEINUR", "UROBILINOGEN", "NITRITE",  "LEUKOCYTESUR" in the last 72 hours.  Invalid input(s): "APPERANCEUR"      Component Value Date/Time   CHOL 99 10/29/2021 0048   TRIG 40 10/29/2021 0048   HDL 27 (L) 10/29/2021 0048   CHOLHDL 3.7 10/29/2021 0048   VLDL 8 10/29/2021 0048   LDLCALC 64 10/29/2021 0048   Lab Results  Component Value Date   HGBA1C 13.2 (H) 10/30/2021      Component Value Date/Time   LABOPIA NONE DETECTED 10/29/2021 1744   COCAINSCRNUR NONE DETECTED 10/29/2021 1744   COCAINSCRNUR NONE DETECTED 08/13/2021 1717   LABBENZ NONE DETECTED 10/29/2021 1744   AMPHETMU NONE DETECTED 10/29/2021 1744   THCU NONE DETECTED 10/29/2021 1744   LABBARB NONE DETECTED 10/29/2021 1744    Recent Labs  Lab 10/28/21 1140  ETH <10     I have personally reviewed the radiological images below and agree with the radiology interpretations.  Overnight EEG with video  Result Date: 10/30/2021 Brittany Havens, MD     10/30/2021 10:21 AM Patient Name: Brittany [name] MRN: [id number] Epilepsy Attending: Lora [name] Referring Physician/Provider: Rosalin Hawking, MD Duration: 10/29/2021 1456 to 10/30/2021 1012 Patient history: 61 y.o. 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 EEG to evaluate for seizure Level of alertness: Awake, asleep AEDs during EEG study: Ativan, LCM Technical aspects: This EEG study was done with scalp electrodes positioned according to the 10-20 International system of electrode placement. Electrical activity was reviewed with band pass filter of 1-'70Hz'$ , sensitivity of 7 uV/mm, display speed of 88m/sec with Brittany '60Hz'$  notched filter applied as appropriate. EEG data were recorded continuously and digitally stored.  Video monitoring was available and reviewed as appropriate. Description: The posterior dominant rhythm consists of 9 Hz activity of moderate voltage (25-35 uV) seen  predominantly in posterior head regions, asymmetric ( left<right) and reactive to eye opening and eye closing. Sleep was characterized by vertex waves, sleep spindles (12 to 14 Hz), maximal frontocentral region. EEG also showed intermittent 3 to 5 Hz theta -delta slowing. Spikes were noted in left temporal region  Hyperventilation and photic stimulation were not performed.   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 There is also cortical dysfunction arising from left temporal region likely secondary to underlying structural abnormality. No seizures were seen throughout the recording. PLora [name]  ECHOCARDIOGRAM COMPLETE  Result Date: 10/29/2021    ECHOCARDIOGRAM REPORT   Patient Name:   TCHARMAINE PLACIDODate of Exam: 10/29/2021 Medical Rec #:  [id number]    Height:       60.0 in Accession #:    [id number]   Weight:       123.9 lb Date of Birth:  [birth year]   BSA:          1.523 m Patient Age:    89years      BP:           130/95 mmHg Patient Gender: F             HR:           86 bpm. Exam Location:  Inpatient Procedure: 2D Echo, Color Doppler and Cardiac Doppler Indications:    Stroke  History:        Patient has prior history of Echocardiogram examinations, most                 recent 11/06/2020. Risk Factors:Hypertension and Diabetes. Lupus.  Sonographer:    AClayton LefortRDCS (AE) Referring Phys: [place] 1. Left ventricular ejection fraction, by estimation, is 55 to 60%. The left ventricle has normal function. The left ventricle has no regional wall motion abnormalities. There is mild asymmetric left ventricular hypertrophy of the basal segment. Left ventricular diastolic parameters are consistent with Grade I diastolic dysfunction (impaired relaxation).  2. Right ventricular systolic function is low normal.  The right ventricular size is normal. Tricuspid regurgitation signal is inadequate for assessing PA pressure.  3. The mitral valve is abnormal. Trivial mitral valve regurgitation. Moderate mitral annular calcification.  4. The aortic valve is tricuspid. Aortic valve regurgitation is not visualized.  5. The inferior vena cava is normal in size with greater than 50% respiratory variability, suggesting right atrial pressure of 3 mmHg. Comparison(s): Prior images reviewed side by side. LVEF remains normal at 55-60%. FINDINGS  Left Ventricle: Left ventricular ejection fraction, by estimation, is 55 to 60%. The left ventricle has normal function. The left ventricle has no  regional wall motion abnormalities. The left ventricular internal cavity size was normal in size. There is  mild asymmetric left ventricular hypertrophy of the basal segment. Left ventricular diastolic parameters are consistent with Grade I diastolic dysfunction (impaired relaxation). Right Ventricle: The right ventricular size is normal. No increase in right ventricular wall thickness. Right ventricular systolic function is low normal. Tricuspid regurgitation signal is inadequate for assessing PA pressure. Left Atrium: Left atrial size was normal in size. Right Atrium: Right atrial size was normal in size. Pericardium: There is no evidence of pericardial effusion. Mitral Valve: The mitral valve is abnormal. There is mild thickening of the mitral valve leaflet(s). There is mild calcification of the mitral valve leaflet(s). Moderate mitral annular calcification. Trivial mitral valve regurgitation. Tricuspid Valve: The tricuspid valve is grossly normal. Tricuspid valve regurgitation is trivial. Aortic Valve: The aortic valve is tricuspid. There is mild aortic valve annular calcification. Aortic valve regurgitation is not visualized. Aortic valve mean gradient measures 1.0 mmHg. Aortic valve peak gradient measures 2.5 mmHg. Aortic valve area, by  VTI  measures 4.08 cm. Pulmonic Valve: The pulmonic valve was grossly normal. Pulmonic valve regurgitation is trivial. Aorta: The aortic root is normal in size and structure. Venous: The inferior vena cava is normal in size with greater than 50% respiratory variability, suggesting right atrial pressure of 3 mmHg. IAS/Shunts: No atrial level shunt detected by color flow Doppler.  LEFT VENTRICLE PLAX 2D LVIDd:         3.20 cm   Diastology LVIDs:         2.20 cm   LV e' medial:    6.09 cm/s LV PW:         1.10 cm   LV E/e' medial:  13.7 LV IVS:        1.20 cm   LV e' lateral:   5.98 cm/s LVOT diam:     2.30 cm   LV E/e' lateral: 14.0 LV SV:         47 LV SV Index:   31 LVOT Area:     4.15 cm  RIGHT VENTRICLE RV Basal diam:  2.40 cm RV S prime:     6.85 cm/s TAPSE (M-mode): 1.6 cm LEFT ATRIUM             Index        RIGHT ATRIUM          Index LA diam:        2.20 cm 1.44 cm/m   RA Area:     7.79 cm LA Vol (A2C):   12.7 ml 8.34 ml/m   RA Volume:   13.50 ml 8.86 ml/m LA Vol (A4C):   25.1 ml 16.48 ml/m LA Biplane Vol: 19.3 ml 12.67 ml/m  AORTIC VALVE AV Area (Vmax):    3.29 cm AV Area (Vmean):   3.18 cm AV Area (VTI):     4.08 cm AV Vmax:           78.40 cm/s AV Vmean:          53.100 cm/s AV VTI:            0.115 m AV Peak Grad:      2.5 mmHg AV Mean Grad:      1.0 mmHg LVOT Vmax:         62.00 cm/s LVOT Vmean:        40.600 cm/s LVOT VTI:          0.113 m LVOT/AV VTI  ratio: 0.98  AORTA Ao Root diam: 3.30 cm Ao Asc diam:  3.20 cm MITRAL VALVE MV Area (PHT): 4.96 cm     SHUNTS MV Decel Time: 153 msec     Systemic VTI:  0.11 m MV E velocity: 83.70 cm/s   Systemic Diam: 2.30 cm MV Brittany velocity: 119.00 cm/s MV E/Brittany ratio:  0.70 Rozann Lesches MD Electronically signed by Rozann Lesches MD Signature Date/Time: 10/29/2021/3:08:29 PM    Final    MR BRAIN WO CONTRAST  Result Date: 10/29/2021 CLINICAL DATA:  Acute neuro deficit. EXAM: MRI HEAD WITHOUT CONTRAST TECHNIQUE: Multiplanar, multiecho pulse sequences of the  brain and surrounding structures were obtained without intravenous contrast. COMPARISON:  CT head 10/28/2021 FINDINGS: Brain: Limited MRI. The patient was not able to hold still and was scanned in the lateral decubitus position. After several sequences, the patient left the scanner and the study was terminated. Diffusion-weighted imaging is diagnostic with mild motion. No acute infarct identified Chronic infarct left cerebellum and left occipital lobe as noted on prior studies. Chronic microvascular ischemic change also noted in the white matter. No hydrocephalus. IMPRESSION: Limited and incomplete study.  No acute infarct. Electronically Signed   By: Franchot Gallo M.D.   On: 10/29/2021 11:11   EEG adult  Result Date: 10/28/2021 Greta Doom, MD     10/28/2021  5:54 PM History: Concern for seizure Sedation: none Technique: This EEG was acquired with electrodes placed according to the International 10-20 electrode system (including Fp1, Fp2, F3, F4, C3, C4, P3, P4, O1, O2, T3, T4, T5, T6, A1, A2, Fz, Cz, Pz). The following electrodes were missing or displaced: none. Background: The background consists of intermixed alpha and beta activities. There is Brittany well defined posterior dominant rhythm of 9 Hz that is better seen on the right than left. Sleep is not recorded. Photic stimulation: Physiologic driving is not performed EEG Abnormalities: 1) Assymetric PDR Clinical Interpretation: This EEG recorded evidence of Brittany left posterior cortical dysfunction consistent with the patient's know parieto-occipital infarct. There was no seizure or seizure predisposition recorded on this study. Please note that lack of epileptiform activity on EEG does not preclude the possibility of epilepsy. Roland Rack, MD [facility] [phone] If 7pm- 7am, please page neurology on call as listed in [place].   DG Chest Portable 1 View  Result Date: 10/28/2021 CLINICAL DATA:  Altered level of consciousness  EXAM: PORTABLE CHEST 1 VIEW COMPARISON:  09/21/2021 FINDINGS: Single frontal view of the chest demonstrates Brittany stable cardiac silhouette. Chronic bibasilar interstitial prominence and linear areas of consolidation, right greater than left. No new airspace disease, effusion, or pneumothorax. No acute bony abnormalities. IMPRESSION: 1. Chronic bibasilar interstitial prominence with scattered bibasilar consolidation, which has been stable since the 06/25/2021 exam. Findings are likely sequela of postinflammatory scarring or chronic aspiration pneumonia. 2. Otherwise unremarkable chest x-ray. Electronically Signed   By: Randa Ngo M.D.   On: 10/28/2021 15:02   CT ANGIO HEAD NECK W WO CM W PERF (CODE STROKE)  Result Date: 10/28/2021 CLINICAL DATA:  61 year old female code stroke presentation. Chronic infarcts. EXAM: CT ANGIOGRAPHY HEAD AND NECK TECHNIQUE: Multidetector CT imaging of the head and neck was performed using the standard protocol during bolus administration of intravenous contrast. Multiplanar CT image reconstructions and MIPs were obtained to evaluate the vascular anatomy. Carotid stenosis measurements (when applicable) are obtained utilizing NASCET criteria, using the distal internal carotid diameter as the denominator. CT Cerebral Perfusion also performed, with planned analysis by  RAPID software, but the study is nondiagnostic due to patient motion. RADIATION DOSE REDUCTION: This exam was performed according to the departmental dose-optimization program which includes automated exposure control, adjustment of the mA and/or kV according to patient size and/or use of iterative reconstruction technique. CONTRAST:  113m OMNIPAQUE IOHEXOL 350 MG/ML SOLN COMPARISON:  Plain head CT 1148 hours today. FINDINGS: CT Perfusion: Nondiagnostic due to pronounced patient motion. CTA NECK Skeleton: Cervical spine degeneration. No acute osseous abnormality identified. Upper chest: Centrilobular emphysema. Mild upper  lung atelectasis. No superior mediastinal lymphadenopathy. Other neck: No neck mass or lymphadenopathy identified. Aortic arch: Calcified aortic atherosclerosis. Aberrant origin of the right subclavian artery, 4 vessel arch configuration. Right carotid system: Mildly tortuous proximal right CCA. Right CCA and right ICA intermittent plaque with no significant stenosis to the skull base. Left carotid system: Mildly tortuous proximal left CCA. Less pronounced left carotid plaque in the neck with no stenosis to the skull base. Vertebral arteries: Aberrant origin proximal right subclavian artery with atherosclerosis but no significant stenosis. Right vertebral artery origin is normal. Right vertebral artery is non dominant and diminutive but patent to the skull base with no focal stenosis identified. Proximal left subclavian artery soft and calcified plaque without significant stenosis. Calcified plaque at the left vertebral artery origin but no significant stenosis. Tortuous left V1 segment. Dominant left vertebral artery is patent to the skull base with only mild stenosis. CTA HEAD Posterior circulation: Dominant left V4 segment. Patent PICA origins and no distal vertebral or vertebrobasilar junction stenosis. Patent basilar artery without stenosis. Patent SCA and PCA origins. Right posterior communicating artery is present. Smaller left posterior communicating artery is present. There is up to moderate irregularity of the left PCA with attenuated distal PCA branches, chronic infarct in that vascular territory. Right PCA branches are patent with only mild irregularity. Anterior circulation: Both ICA siphons are patent. Mild siphon plaque, no siphon stenosis. Normal posterior communicating artery origins. Patent carotid termini, MCA and ACA origins. Tortuous A1 segments. Anterior communicating artery and bilateral ACA branches are within normal limits. Right MCA M1 segment and bifurcation are patent with only mild  tortuosity and irregularity. No right MCA branch occlusion. Left MCA M1 segment and bifurcation are patent with only mild irregularity. No left MCA branch occlusion is identified. Venous sinuses: Early contrast timing, not well evaluated. Anatomic variants: Dominant left vertebral artery. Aberrant origin of the right subclavian artery. Fetal type right PCA origin. Review of the MIP images confirms the above findings IMPRESSION: 1. Negative for emergent large vessel occlusion. Nondiagnostic CT Perfusion due to patient motion. 2. Irregular and attenuated Left PCA branches compatible with chronic infarct in that territory. 3. Otherwise generally mild for age atherosclerosis in the head and neck. No other significant arterial stenosis identified. 4. More advanced Aortic Atherosclerosis (ICD10-I70.0). 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 5. Emphysema (ICD10-J43.9). Preliminary CTA results were discussed by telephone with Neurology Dr. SQuinn Axeon 10/28/2021 at 1214 hours. Electronically Signed   By: HGenevie AnnM.D.   On: 10/28/2021 12:35   CT HEAD CODE STROKE WO CONTRAST  Result Date: 10/28/2021 CLINICAL DATA:  Code stroke. 61year old female with history of multiple prior infarcts. New aphasia. Possible seizure activity. EXAM: CT HEAD WITHOUT CONTRAST TECHNIQUE: Contiguous axial images were obtained from the base of the skull through the vertex without intravenous contrast. RADIATION DOSE REDUCTION: This exam was performed according to the departmental dose-optimization program which includes automated exposure control, adjustment of the mA and/or kV  according to patient size and/or use of iterative reconstruction technique. COMPARISON:  Head CT 09/21/2021. FINDINGS: Brain: Multifocal chronic infarcts and encephalomalacia appears stable by CT since 09/21/2021. This includes involvement in the anterior right MCA territory, extensive left temporal and occipital lobe encephalomalacia, extensive left  cerebellar encephalomalacia. Additional confluent bilateral cerebral white matter hypodensity including deep white matter capsule involvement. No acute intracranial hemorrhage identified. No midline shift, mass effect, or evidence of intracranial mass lesion. No ventriculomegaly. No cortically based acute infarct identified. Vascular: Calcified atherosclerosis at the skull base. No suspicious intracranial vascular hyperdensity. Skull: No acute osseous abnormality identified. Sinuses/Orbits: Visualized paranasal sinuses and mastoids are stable and well aerated. Other: No acute orbit or scalp soft tissue finding. ASPECTS [facility] Stroke Program Early CT Score) Total score (0-10 with 10 being normal): 10, chronic encephalomalacia. IMPRESSION: 1. Stable CT appearance of advanced ischemic disease since last month. 2. No acute cortically based infarct or acute intracranial hemorrhage identified. ASPECTS 10. 3. Study discussed by telephone with Dr. Quinn Axe on 10/28/2021 at 1155 hours. Electronically Signed   By: Genevie Ann M.D.   On: 10/28/2021 11:58     PHYSICAL EXAM  Temp:  [97.9 F (36.6 C)-98.5 F (36.9 C)] 98 F (36.7 C) (09/19 0813) Pulse Rate:  [94-103] 102 (09/19 0813) Resp:  [17-18] 18 (09/19 0813) BP: (106-128)/(73-89) 106/73 (09/19 0813) SpO2:  [93 %-99 %] 93 % (09/19 0813)  General - Well nourished, well developed, agitated and combative intermittently  Ophthalmologic - fundi not visualized due to noncooperation.  Cardiovascular - Regular rhythm and rate.  Neuro - awake, alert, eyes open, agitated- but able to state year and name. Disoriented to place, time, situation, not cooperative with exam. No gaze palsy, tracking bilaterally. No facial droop. Moving all extremities strong equally, Sensation, coordination and gait not tested.   ASSESSMENT/PLAN Brittany [name] is Brittany 61 y.o. 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  TNK given.  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 No seizures were seen throughout the recording. 2D Echo EF 55 to 60% LDL 64 HgbA1c 13.2 UDS negative SCDs for VTE prophylaxis aspirin 81 mg daily prior to admission, now on aspirin 81 mg daily.  Increased Vimpat to 100 twice daily Ongoing aggressive stroke risk factor management Therapy recommendations: Pending Disposition: Pending  History of seizure 03/2021 admitted for confusion, possible seizure and possible COVID.  EEG showed LPD with brief ictal discharge.  Long-term EEG showed left temporal epileptogenicity, discharged on Keppra 500 Per pharmacy, patient did not refill Keppra  History of stroke 07/2019 admitted for nausea vomiting, headache and generalized weakness.  Found to have DKA.  MRI showed left inferior cerebellum infarct.  LDL 153, A1c 13.5.  Carotid Doppler negative.  EF 55 to 60%. 10/2020 admitted for altered mental status, slurred speech, dysphagia and aphasia with right-sided mild weakness.  CT showed left PCA infarct.  MRI showed early subacute left PCA infarct, old bilateral BG lacunar infarct, left cerebellum old infarct.  Carotid Doppler negative, MRA negative.  EF 65 to 70%.  Discharged on DAPT.  Agitation and combative ? 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 Continue Seroquel On restrain Psychiatry  recs- depakote and zyprexa in addition to seroquel On depakote 250 bid  Diabetes, uncontrolled HgbA1c 13.2 goal < 7.0 Hyperglycemia on presentation, glucose 450 Uncontrolled Currently on Levemir CBG monitoring SSI with 3u Novolog with meals added DM education and close PCP follow  up  Hypertension Home meds Cardizem 120 Stable Long term BP goal normotensive  Hyperlipidemia Home meds: Lipitor 40 LDL 64, goal < 70 Now on Lipitor 40 Continue statin at discharge  Tobacco abuse Current smoker Smoking cessation counseling will be provided Pt is willing to quit  Other Stroke Risk Factors   Other Active Problems Lower back pain Discoid lupus 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 MD to update note as needed.   Janine Ores, DNP, FNP-BC [facility] Pager: [phone]  ATTENDING NOTE: I reviewed above note and agree with the assessment and plan. Pt was seen and examined.   Patient sitting in bed, still in soft wrist restraints and abdominal binder.  However seems calm and noncombative, asking for food.  Orientated to place and self, however not orientated to age or time.  Moving all extremities equally, no facial droop, tracking bilaterally blinking to visual threat bilaterally.  Fluent language, however intermittent paraphasic errors.  On Vimpat for seizure control.  Had psychiatric consultation, recommend Depakote.  Pending SNF back to [place] if able to get with string of 24 hours.  For detailed assessment and plan, please refer to above/below as I have made changes wherever appropriate.   Brittany Hawking, MD PhD Stroke Neurology 10/31/2021 10:41 PM    To contact Stroke Continuity provider, please refer to [email or web address] After hours, contact General Neurology

## 2021-10-31 NOTE — Evaluation (Signed)
SLP Cancellation Note  Patient Details Name: Brittany [name] MRN: [id number] DOB: Nov 23, 1960   Cancelled treatment:       Reason Eval/Treat Not Completed: Fatigue/lethargy limiting ability to participate (pt lethargic, did awaken to say "ok" when advised would come back tomorrow for evaluation otherwise remained asleep; RN reports pt received Ativan last night but none today) Brittany Lime, MS [place] Office [phone] Pager [phone]   Brittany [name] 10/31/2021, 9:48 AM

## 2021-10-31 NOTE — Inpatient Diabetes Management (Addendum)
Inpatient Diabetes Program Recommendations  AACE/ADA: New Consensus Statement on Inpatient Glycemic Control (2015)  Target Ranges:  Prepandial:   less than 140 mg/dL      Peak postprandial:   less than 180 mg/dL (1-2 hours)      Critically ill patients:  140 - 180 mg/dL   Lab Results  Component Value Date   GLUCAP 261 (H) 10/31/2021   HGBA1C 13.2 (H) 10/30/2021    Review of Glycemic Control  Latest Reference Range & Units 10/30/21 06:38 10/30/21 07:57 10/30/21 12:20 10/30/21 16:08 10/30/21 21:58 10/31/21 06:34  Glucose-Capillary 70 - 99 mg/dL 421 (H) 516 (HH) 111 (H) 245 (H) 277 (H) 261 (H)    Diabetes history: DM1 (does NOT make any insulin; requires basal, correction, and carb coverage insulin) Outpatient Diabetes medications: Lantus 8 units daily, Humalog 0-9 units TID with meals and at HS Current orders for Inpatient glycemic control:  Novolog 0-15 units tid + hs Levemir 11 units qhs Novolog 3 units tid meal coverage  A1c 13.2% on 9/18  Inpatient Diabetes Program Recommendations:    -   Consider increase meal coverage to Novolog 5 units tid.  Thanks,  Tama Headings RN, MSN, BC-ADM Inpatient Diabetes Coordinator Team Pager [phone] (8a-5p)

## 2021-11-01 DIAGNOSIS — R739 Hyperglycemia, unspecified: Secondary | ICD-10-CM | POA: Diagnosis not present

## 2021-11-01 DIAGNOSIS — R410 Disorientation, unspecified: Secondary | ICD-10-CM

## 2021-11-01 DIAGNOSIS — E11649 Type 2 diabetes mellitus with hypoglycemia without coma: Secondary | ICD-10-CM | POA: Diagnosis not present

## 2021-11-01 DIAGNOSIS — R569 Unspecified convulsions: Secondary | ICD-10-CM | POA: Diagnosis not present

## 2021-11-01 LAB — BASIC METABOLIC PANEL
Anion gap: 10 (ref 5–15)
BUN: 18 mg/dL (ref 8–23)
CO2: 22 mmol/L (ref 22–32)
Calcium: 9.3 mg/dL (ref 8.9–10.3)
Chloride: 103 mmol/L (ref 98–111)
Creatinine, Ser: 0.82 mg/dL (ref 0.44–1.00)
GFR, Estimated: >60 mL/min (ref >60)
Glucose, Bld: 377 mg/dL — ABNORMAL HIGH (ref 70–99)
Potassium: 4.2 mmol/L (ref 3.5–5.1)
Sodium: 135 mmol/L (ref 135–145)

## 2021-11-01 LAB — GLUCOSE, CAPILLARY
Glucose-Capillary: 497 mg/dL — ABNORMAL HIGH (ref 70–99)
Glucose-Capillary: 334 mg/dL — ABNORMAL HIGH (ref 70–99)
Glucose-Capillary: 226 mg/dL — ABNORMAL HIGH (ref 70–99)
Glucose-Capillary: 231 mg/dL — ABNORMAL HIGH (ref 70–99)
Glucose-Capillary: 349 mg/dL — ABNORMAL HIGH (ref 70–99)

## 2021-11-01 LAB — CBC
HCT: 34.7 % — ABNORMAL LOW (ref 36.0–46.0)
Hemoglobin: 11.4 g/dL — ABNORMAL LOW (ref 12.0–15.0)
MCH: 30 pg (ref 26.0–34.0)
MCHC: 32.9 g/dL (ref 30.0–36.0)
MCV: 91.3 fL (ref 80.0–100.0)
Platelets: 497 10*3/uL — ABNORMAL HIGH (ref 150–400)
RBC: 3.8 MIL/uL — ABNORMAL LOW (ref 3.87–5.11)
RDW: 15.6 % — ABNORMAL HIGH (ref 11.5–15.5)
WBC: 7.9 10*3/uL (ref 4.0–10.5)
nRBC: 0.3 % — ABNORMAL HIGH (ref 0.0–0.2)

## 2021-11-01 MED ORDER — INSULIN DETEMIR 100 UNIT/ML ~~LOC~~ SOLN
6.0000 [IU] | Freq: Every day | SUBCUTANEOUS | Status: DC
  Administered 2021-11-01 – 2021-11-09 (×9): 6 [IU] via SUBCUTANEOUS
  Filled 2021-11-01 (×10): qty 0.06

## 2021-11-01 MED ORDER — NICOTINE 14 MG/24HR TD PT24
14.0000 mg | MEDICATED_PATCH | Freq: Every day | TRANSDERMAL | Status: DC
  Administered 2021-11-01 – 2021-11-09 (×7): 14 mg via TRANSDERMAL
  Filled 2021-11-01 (×8): qty 1

## 2021-11-01 MED ORDER — INSULIN ASPART 100 UNIT/ML IJ SOLN
5.0000 [IU] | Freq: Three times a day (TID) | INTRAMUSCULAR | Status: DC
  Administered 2021-11-01 – 2021-11-02 (×4): 5 [IU] via SUBCUTANEOUS

## 2021-11-01 NOTE — Progress Notes (Signed)
SLE completed, full report to follow. Pt presents with severe cognitive linguistic deficits with impairments in sustained attention, significant perseveration and language deficits.  She perseverated on incorrect year, eating- getting food, and during ordering meal (stated hamburger instead of fries or chips repeatedly).  She was able to articulate her displeasure with her ALF due to inadequate food supply and odor without hesitation.  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   Decreased cooperation with exam noted unless topic of which she is motivated, etc ordering foods.  She was disoriented to current year, year of her birth, but is aware of admit for possible CVA and seizures.  Pt admits to increased difficulty currently with communication -recommend follow up with SLP via [facility] or at SNF.    Brittany Lime, MS [facility] SLP Acute Rehab Services Office [phone] Pager [phone]

## 2021-11-01 NOTE — Progress Notes (Signed)
3709 CBG 497, Dr Rory Percy made aware. 15 units of insulin given. Awaiting blood extraction for BMP.

## 2021-11-01 NOTE — Progress Notes (Addendum)
STROKE TEAM PROGRESS NOTE   SUBJECTIVE (INTERVAL HISTORY) Seen in room, still requiring wrist restraints.  Has been hemodynamically stable.  Perseverating on food.  OBJECTIVE Temp:  [97.3 F (36.3 C)-99.1 F (37.3 C)] 97.7 F (36.5 C) (09/20 1234) Pulse Rate:  [98-105] 100 (09/20 1234) Cardiac Rhythm: Normal sinus rhythm (09/20 0759) Resp:  [16-20] 17 (09/20 1234) BP: (100-133)/(71-88) 100/80 (09/20 1234) SpO2:  [94 %-100 %] 100 % (09/20 1234)  Recent Labs  Lab 10/31/21 1222 10/31/21 1616 10/31/21 2207 11/01/21 0627 11/01/21 0856  GLUCAP 235* 146* 195* 497* 334*    Recent Labs  Lab 10/28/21 1140 10/28/21 1145 10/28/21 1311 10/30/21 0300 10/30/21 1436 10/31/21 0301 11/01/21 0949  NA 126* 127* 127* 134* 135 136 135  K 4.7 4.4 4.5 5.4* 4.5 3.9 4.2  CL 101 99  --  101 105 106 103  CO2 14*  --   --  20* '23 23 22  '$ GLUCOSE 545* 561*  --  305* 201* 186* 377*  BUN 20 20  --  '16 21 20 18  '$ CREATININE 0.87 0.60  --  0.99 0.86 0.60 0.82  CALCIUM 8.4*  --   --  8.9 8.8* 9.2 9.3    Recent Labs  Lab 10/28/21 1140  AST 45*  ALT 50*  ALKPHOS 190*  BILITOT 0.5  PROT 8.2*  ALBUMIN 3.1*    Recent Labs  Lab 10/28/21 1140 10/28/21 1145 10/28/21 1311 10/30/21 1004 10/31/21 0301 11/01/21 0949  WBC 6.7  --   --  7.4 8.6 7.9  NEUTROABS 3.3  --   --   --   --   --   HGB 11.1* 13.3 12.6 10.4* 10.4* 11.4*  HCT 35.4* 39.0 37.0 31.1* 32.3* 34.7*  MCV 94.9  --   --  88.9 90.7 91.3  PLT 419*  --   --  477* 454* 497*    No results for input(s): "CKTOTAL", "CKMB", "CKMBINDEX", "TROPONINI" in the last 168 hours. No results for input(s): "LABPROT", "INR" in the last 72 hours.  No results for input(s): "COLORURINE", "LABSPEC", "PHURINE", "GLUCOSEU", "HGBUR", "BILIRUBINUR", "KETONESUR", "PROTEINUR", "UROBILINOGEN", "NITRITE", "LEUKOCYTESUR" in the last 72 hours.  Invalid input(s): "APPERANCEUR"      Component Value Date/Time   CHOL 99 10/29/2021 0048   TRIG 40 10/29/2021  0048   HDL 27 (L) 10/29/2021 0048   CHOLHDL 3.7 10/29/2021 0048   VLDL 8 10/29/2021 0048   LDLCALC 64 10/29/2021 0048   Lab Results  Component Value Date   HGBA1C 13.2 (H) 10/30/2021      Component Value Date/Time   LABOPIA NONE DETECTED 10/29/2021 1744   COCAINSCRNUR NONE DETECTED 10/29/2021 1744   COCAINSCRNUR NONE DETECTED 08/13/2021 1717   LABBENZ NONE DETECTED 10/29/2021 1744   AMPHETMU NONE DETECTED 10/29/2021 1744   THCU NONE DETECTED 10/29/2021 1744   LABBARB NONE DETECTED 10/29/2021 1744    Recent Labs  Lab 10/28/21 1140  ETH <10     I have personally reviewed the radiological images below and agree with the radiology interpretations.  Overnight EEG with video  Result Date: 10/30/2021 Lora Havens, MD     10/30/2021 10:21 AM Patient Name: Brittany [name] MRN: [id number] Epilepsy Attending: Lora Havens Referring Physician/Provider: Rosalin Hawking, MD Duration: 10/29/2021 1456 to 10/30/2021 1012 Patient history: 61 y.o. 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 EEG to evaluate for seizure Level of  alertness: Awake, asleep AEDs during EEG study: Ativan, LCM Technical aspects: This EEG study was done with scalp electrodes positioned according to the 10-20 International system of electrode placement. Electrical activity was reviewed with band pass filter of 1-'70Hz'$ , sensitivity of 7 uV/mm, display speed of 66m/sec with a '60Hz'$  notched filter applied as appropriate. EEG data were recorded continuously and digitally stored.  Video monitoring was available and reviewed as appropriate. Description: The posterior dominant rhythm consists of 9 Hz activity of moderate voltage (25-35 uV) seen predominantly in posterior head regions, asymmetric ( left<right) and reactive to eye opening and eye closing. Sleep was characterized by vertex waves, sleep  spindles (12 to 14 Hz), maximal frontocentral region. EEG also showed intermittent 3 to 5 Hz theta -delta slowing. Spikes were noted in left temporal region  Hyperventilation and photic stimulation were not performed.   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 There is also cortical dysfunction arising from left temporal region likely secondary to underlying structural abnormality. No seizures were seen throughout the recording. PLora Havens  ECHOCARDIOGRAM COMPLETE  Result Date: 10/29/2021    ECHOCARDIOGRAM REPORT   Patient Name:   Brittany BARIDate of Exam: 10/29/2021 Medical Rec #:  [id number]    Height:       60.0 in Accession #:    [id number]   Weight:       123.9 lb Date of Birth:  [birth year]   BSA:          1.523 m Patient Age:    89years      BP:           130/95 mmHg Patient Gender: F             HR:           86 bpm. Exam Location:  Inpatient Procedure: 2D Echo, Color Doppler and Cardiac Doppler Indications:    Stroke  History:        Patient has prior history of Echocardiogram examinations, most                 recent 11/06/2020. Risk Factors:Hypertension and Diabetes. Lupus.  Sonographer:    AClayton LefortRDCS (AE) Referring Phys: AOliver 1. Left ventricular ejection fraction, by estimation, is 55 to 60%. The left ventricle has normal function. The left ventricle has no regional wall motion abnormalities. There is mild asymmetric left ventricular hypertrophy of the basal segment. Left ventricular diastolic parameters are consistent with Grade I diastolic dysfunction (impaired relaxation).  2. Right ventricular systolic function is low normal. The right ventricular size is normal. Tricuspid regurgitation signal is inadequate for assessing PA pressure.  3. The mitral valve is abnormal. Trivial mitral  valve regurgitation. Moderate mitral annular calcification.  4. The aortic valve is tricuspid. Aortic valve regurgitation is not visualized.  5. The inferior vena cava is normal in size with greater than 50% respiratory variability, suggesting right atrial pressure of 3 mmHg. Comparison(s): Prior images reviewed side by side. LVEF remains normal at 55-60%. FINDINGS  Left Ventricle: Left ventricular ejection fraction, by estimation, is 55 to 60%. The left ventricle has normal function. The left ventricle has no regional wall motion abnormalities. The left ventricular internal cavity size was normal in size. There is  mild asymmetric left ventricular hypertrophy of the  basal segment. Left ventricular diastolic parameters are consistent with Grade I diastolic dysfunction (impaired relaxation). Right Ventricle: The right ventricular size is normal. No increase in right ventricular wall thickness. Right ventricular systolic function is low normal. Tricuspid regurgitation signal is inadequate for assessing PA pressure. Left Atrium: Left atrial size was normal in size. Right Atrium: Right atrial size was normal in size. Pericardium: There is no evidence of pericardial effusion. Mitral Valve: The mitral valve is abnormal. There is mild thickening of the mitral valve leaflet(s). There is mild calcification of the mitral valve leaflet(s). Moderate mitral annular calcification. Trivial mitral valve regurgitation. Tricuspid Valve: The tricuspid valve is grossly normal. Tricuspid valve regurgitation is trivial. Aortic Valve: The aortic valve is tricuspid. There is mild aortic valve annular calcification. Aortic valve regurgitation is not visualized. Aortic valve mean gradient measures 1.0 mmHg. Aortic valve peak gradient measures 2.5 mmHg. Aortic valve area, by  VTI measures 4.08 cm. Pulmonic Valve: The pulmonic valve was grossly normal. Pulmonic valve regurgitation is trivial. Aorta: The aortic root is normal in size and  structure. Venous: The inferior vena cava is normal in size with greater than 50% respiratory variability, suggesting right atrial pressure of 3 mmHg. IAS/Shunts: No atrial level shunt detected by color flow Doppler.  LEFT VENTRICLE PLAX 2D LVIDd:         3.20 cm   Diastology LVIDs:         2.20 cm   LV e' medial:    6.09 cm/s LV PW:         1.10 cm   LV E/e' medial:  13.7 LV IVS:        1.20 cm   LV e' lateral:   5.98 cm/s LVOT diam:     2.30 cm   LV E/e' lateral: 14.0 LV SV:         47 LV SV Index:   31 LVOT Area:     4.15 cm  RIGHT VENTRICLE RV Basal diam:  2.40 cm RV S prime:     6.85 cm/s TAPSE (M-mode): 1.6 cm LEFT ATRIUM             Index        RIGHT ATRIUM          Index LA diam:        2.20 cm 1.44 cm/m   RA Area:     7.79 cm LA Vol (A2C):   12.7 ml 8.34 ml/m   RA Volume:   13.50 ml 8.86 ml/m LA Vol (A4C):   25.1 ml 16.48 ml/m LA Biplane Vol: 19.3 ml 12.67 ml/m  AORTIC VALVE AV Area (Vmax):    3.29 cm AV Area (Vmean):   3.18 cm AV Area (VTI):     4.08 cm AV Vmax:           78.40 cm/s AV Vmean:          53.100 cm/s AV VTI:            0.115 m AV Peak Grad:      2.5 mmHg AV Mean Grad:      1.0 mmHg LVOT Vmax:         62.00 cm/s LVOT Vmean:        40.600 cm/s LVOT VTI:          0.113 m LVOT/AV VTI ratio: 0.98  AORTA Ao Root diam: 3.30 cm Ao Asc diam:  3.20 cm MITRAL VALVE MV Area (PHT): 4.96 cm  SHUNTS MV Decel Time: 153 msec     Systemic VTI:  0.11 m MV E velocity: 83.70 cm/s   Systemic Diam: 2.30 cm MV A velocity: 119.00 cm/s MV E/A ratio:  0.70 Rozann Lesches MD Electronically signed by Rozann Lesches MD Signature Date/Time: 10/29/2021/3:08:29 PM    Final    MR BRAIN WO CONTRAST  Result Date: 10/29/2021 CLINICAL DATA:  Acute neuro deficit. EXAM: MRI HEAD WITHOUT CONTRAST TECHNIQUE: Multiplanar, multiecho pulse sequences of the brain and surrounding structures were obtained without intravenous contrast. COMPARISON:  CT head 10/28/2021 FINDINGS: Brain: Limited MRI. The patient was not able  to hold still and was scanned in the lateral decubitus position. After several sequences, the patient left the scanner and the study was terminated. Diffusion-weighted imaging is diagnostic with mild motion. No acute infarct identified Chronic infarct left cerebellum and left occipital lobe as noted on prior studies. Chronic microvascular ischemic change also noted in the white matter. No hydrocephalus. IMPRESSION: Limited and incomplete study.  No acute infarct. Electronically Signed   By: Franchot Gallo M.D.   On: 10/29/2021 11:11   EEG adult  Result Date: 10/28/2021 Greta Doom, MD     10/28/2021  5:54 PM History: Concern for seizure Sedation: none Technique: This EEG was acquired with electrodes placed according to the International 10-20 electrode system (including Fp1, Fp2, F3, F4, C3, C4, P3, P4, O1, O2, T3, T4, T5, T6, A1, A2, Fz, Cz, Pz). The following electrodes were missing or displaced: none. Background: The background consists of intermixed alpha and beta activities. There is a well defined posterior dominant rhythm of 9 Hz that is better seen on the right than left. Sleep is not recorded. Photic stimulation: Physiologic driving is not performed EEG Abnormalities: 1) Assymetric PDR Clinical Interpretation: This EEG recorded evidence of a left posterior cortical dysfunction consistent with the patient's know parieto-occipital infarct. There was no seizure or seizure predisposition recorded on this study. Please note that lack of epileptiform activity on EEG does not preclude the possibility of epilepsy. Roland Rack, MD [facility] [phone] If 7pm- 7am, please page neurology on call as listed in [facility].   DG Chest Portable 1 View  Result Date: 10/28/2021 CLINICAL DATA:  Altered level of consciousness EXAM: PORTABLE CHEST 1 VIEW COMPARISON:  09/21/2021 FINDINGS: Single frontal view of the chest demonstrates a stable cardiac silhouette. Chronic bibasilar interstitial  prominence and linear areas of consolidation, right greater than left. No new airspace disease, effusion, or pneumothorax. No acute bony abnormalities. IMPRESSION: 1. Chronic bibasilar interstitial prominence with scattered bibasilar consolidation, which has been stable since the 06/25/2021 exam. Findings are likely sequela of postinflammatory scarring or chronic aspiration pneumonia. 2. Otherwise unremarkable chest x-ray. Electronically Signed   By: Randa Ngo M.D.   On: 10/28/2021 15:02   CT ANGIO HEAD NECK W WO CM W PERF (CODE STROKE)  Result Date: 10/28/2021 CLINICAL DATA:  61 year old female code stroke presentation. Chronic infarcts. EXAM: CT ANGIOGRAPHY HEAD AND NECK TECHNIQUE: Multidetector CT imaging of the head and neck was performed using the standard protocol during bolus administration of intravenous contrast. Multiplanar CT image reconstructions and MIPs were obtained to evaluate the vascular anatomy. Carotid stenosis measurements (when applicable) are obtained utilizing NASCET criteria, using the distal internal carotid diameter as the denominator. CT Cerebral Perfusion also performed, with planned analysis by RAPID software, but the study is nondiagnostic due to patient motion. RADIATION DOSE REDUCTION: This exam was performed according to the departmental dose-optimization program which includes  automated exposure control, adjustment of the mA and/or kV according to patient size and/or use of iterative reconstruction technique. CONTRAST:  176m OMNIPAQUE IOHEXOL 350 MG/ML SOLN COMPARISON:  Plain head CT 1148 hours today. FINDINGS: CT Perfusion: Nondiagnostic due to pronounced patient motion. CTA NECK Skeleton: Cervical spine degeneration. No acute osseous abnormality identified. Upper chest: Centrilobular emphysema. Mild upper lung atelectasis. No superior mediastinal lymphadenopathy. Other neck: No neck mass or lymphadenopathy identified. Aortic arch: Calcified aortic atherosclerosis.  Aberrant origin of the right subclavian artery, 4 vessel arch configuration. Right carotid system: Mildly tortuous proximal right CCA. Right CCA and right ICA intermittent plaque with no significant stenosis to the skull base. Left carotid system: Mildly tortuous proximal left CCA. Less pronounced left carotid plaque in the neck with no stenosis to the skull base. Vertebral arteries: Aberrant origin proximal right subclavian artery with atherosclerosis but no significant stenosis. Right vertebral artery origin is normal. Right vertebral artery is non dominant and diminutive but patent to the skull base with no focal stenosis identified. Proximal left subclavian artery soft and calcified plaque without significant stenosis. Calcified plaque at the left vertebral artery origin but no significant stenosis. Tortuous left V1 segment. Dominant left vertebral artery is patent to the skull base with only mild stenosis. CTA HEAD Posterior circulation: Dominant left V4 segment. Patent PICA origins and no distal vertebral or vertebrobasilar junction stenosis. Patent basilar artery without stenosis. Patent SCA and PCA origins. Right posterior communicating artery is present. Smaller left posterior communicating artery is present. There is up to moderate irregularity of the left PCA with attenuated distal PCA branches, chronic infarct in that vascular territory. Right PCA branches are patent with only mild irregularity. Anterior circulation: Both ICA siphons are patent. Mild siphon plaque, no siphon stenosis. Normal posterior communicating artery origins. Patent carotid termini, MCA and ACA origins. Tortuous A1 segments. Anterior communicating artery and bilateral ACA branches are within normal limits. Right MCA M1 segment and bifurcation are patent with only mild tortuosity and irregularity. No right MCA branch occlusion. Left MCA M1 segment and bifurcation are patent with only mild irregularity. No left MCA branch occlusion is  identified. Venous sinuses: Early contrast timing, not well evaluated. Anatomic variants: Dominant left vertebral artery. Aberrant origin of the right subclavian artery. Fetal type right PCA origin. Review of the MIP images confirms the above findings IMPRESSION: 1. Negative for emergent large vessel occlusion. Nondiagnostic CT Perfusion due to patient motion. 2. Irregular and attenuated Left PCA branches compatible with chronic infarct in that territory. 3. Otherwise generally mild for age atherosclerosis in the head and neck. No other significant arterial stenosis identified. 4. More advanced Aortic Atherosclerosis (ICD10-I70.0). 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 5. Emphysema (ICD10-J43.9). Preliminary CTA results were discussed by telephone with Neurology Dr. SQuinn Axeon 10/28/2021 at 1214 hours. Electronically Signed   By: HGenevie AnnM.D.   On: 10/28/2021 12:35   CT HEAD CODE STROKE WO CONTRAST  Result Date: 10/28/2021 CLINICAL DATA:  Code stroke. 61year old female with history of multiple prior infarcts. New aphasia. Possible seizure activity. EXAM: CT HEAD WITHOUT CONTRAST TECHNIQUE: Contiguous axial images were obtained from the base of the skull through the vertex without intravenous contrast. RADIATION DOSE REDUCTION: This exam was performed according to the departmental dose-optimization program which includes automated exposure control, adjustment of the mA and/or kV according to patient size and/or use of iterative reconstruction technique. COMPARISON:  Head CT 09/21/2021. FINDINGS: Brain: Multifocal chronic infarcts and encephalomalacia appears stable by CT  since 09/21/2021. This includes involvement in the anterior right MCA territory, extensive left temporal and occipital lobe encephalomalacia, extensive left cerebellar encephalomalacia. Additional confluent bilateral cerebral white matter hypodensity including deep white matter capsule involvement. No acute intracranial  hemorrhage identified. No midline shift, mass effect, or evidence of intracranial mass lesion. No ventriculomegaly. No cortically based acute infarct identified. Vascular: Calcified atherosclerosis at the skull base. No suspicious intracranial vascular hyperdensity. Skull: No acute osseous abnormality identified. Sinuses/Orbits: Visualized paranasal sinuses and mastoids are stable and well aerated. Other: No acute orbit or scalp soft tissue finding. ASPECTS [facility] Stroke Program Early CT Score) Total score (0-10 with 10 being normal): 10, chronic encephalomalacia. IMPRESSION: 1. Stable CT appearance of advanced ischemic disease since last month. 2. No acute cortically based infarct or acute intracranial hemorrhage identified. ASPECTS 10. 3. Study discussed by telephone with Dr. Quinn Axe on 10/28/2021 at 1155 hours. Electronically Signed   By: Genevie Ann M.D.   On: 10/28/2021 11:58     PHYSICAL EXAM  Temp:  [97.3 F (36.3 C)-99.1 F (37.3 C)] 97.7 F (36.5 C) (09/20 1234) Pulse Rate:  [98-105] 100 (09/20 1234) Resp:  [16-20] 17 (09/20 1234) BP: (100-133)/(71-88) 100/80 (09/20 1234) SpO2:  [94 %-100 %] 100 % (09/20 1234)  General - Well nourished, well developed, calm and cooperative with exam today    Neuro - awake, alert, eyes open, perseverates on food and requests food when asked questions.  Disoriented to place, time, situation, intermittently cooperative with exam. No gaze palsy, tracking bilaterally. No facial droop. Moving all extremities strong equally, Sensation intact to light touch, FNF intact bilaterally.   ASSESSMENT/PLAN Brittany [name] is a 61 y.o. 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  TNK given.  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 No seizures were seen throughout the recording. 2D Echo EF 55 to 60% LDL 64 HgbA1c 13.2 UDS negative SCDs for VTE prophylaxis aspirin 81 mg daily prior to admission, now on aspirin 81 mg daily.  Increased Vimpat to 100 twice daily Ongoing aggressive stroke risk factor management Therapy recommendations: Pending Disposition: Pending  History of seizure 03/2021 admitted for confusion, possible seizure and possible COVID.  EEG showed LPD with brief ictal discharge.  Long-term EEG showed left temporal epileptogenicity, discharged on Keppra 500 Per pharmacy, patient did not refill Keppra  History of stroke 07/2019 admitted for nausea vomiting, headache and generalized weakness.  Found to have DKA.  MRI showed left inferior cerebellum infarct.  LDL 153, A1c 13.5.  Carotid Doppler negative.  EF 55 to 60%. 10/2020 admitted for altered mental status, slurred speech, dysphagia and aphasia with right-sided mild weakness.  CT showed left PCA infarct.  MRI showed early subacute left PCA infarct, old bilateral BG lacunar infarct, left cerebellum old infarct.  Carotid Doppler negative, MRA negative.  EF 65 to 70%.  Discharged on DAPT.  Agitation and combative ? 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 Continue Seroquel On restrain Psychiatry recs- depakote and zyprexa in addition to seroquel On depakote 250 bid Zyprexa PRN  Diabetes, uncontrolled HgbA1c 13.2 goal < 7.0 Hyperglycemia on presentation, glucose 450 Uncontrolled Currently on Levemir BID 6 am/11hs  CBG monitoring SSI SSI with 5u Novolog with meals added DM education and close PCP follow up  Hypertension Home meds Cardizem 120 Stable Long term BP goal normotensive  Hyperlipidemia Home meds: Lipitor 40 LDL 64, goal < 70 Now on Lipitor 40 Continue statin at  discharge  Tobacco abuse Current smoker Smoking cessation counseling will be provided Pt is willing to quit  Other Stroke Risk Factors   Other Active Problems Lower back pain Discoid lupus 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 MD to update note as needed.   [place] , MSN, AGACNP-BC [facility] See Amion for schedule and pager information 11/01/2021 12:54 PM   ATTENDING NOTE: I reviewed above note and agree with the assessment and plan. Pt was seen and examined.   No family at bedside.  Patient lying in bed, still has bilateral wrist restraints and abdominal binder.  Seems calm, not agitated, however persistently asking for food, requesting to remove restraints so that she can eat.  Glucose level still on the high end, diabetic coordinator on board, now on levemir bid and novolog 5U premeal.  Psychiatry on board, on Depakote alone with Seroquel and Zyprexa as needed.    For detailed assessment and plan, please refer to above/below as I have made changes wherever appropriate.   Rosalin Hawking, MD PhD Stroke Neurology 11/01/2021 6:21 PM     To contact Stroke Continuity provider, please refer to [email or web address] After hours, contact General Neurology
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
## 2021-11-01 NOTE — Inpatient Diabetes Management (Signed)
Inpatient Diabetes Program Recommendations  AACE/ADA: New Consensus Statement on Inpatient Glycemic Control (2015)  Target Ranges:  Prepandial:   less than 140 mg/dL      Peak postprandial:   less than 180 mg/dL (1-2 hours)      Critically ill patients:  140 - 180 mg/dL   Lab Results  Component Value Date   GLUCAP 334 (H) 11/01/2021   HGBA1C 13.2 (H) 10/30/2021    Review of Glycemic Control  Latest Reference Range & Units 10/30/21 06:38 10/30/21 07:57 10/30/21 12:20 10/30/21 16:08 10/30/21 21:58 10/31/21 06:34  Glucose-Capillary 70 - 99 mg/dL 421 (H) 516 (HH) 111 (H) 245 (H) 277 (H) 261 (H)    Diabetes history: DM1 (does NOT make any insulin; requires basal, correction, and carb coverage insulin) Outpatient Diabetes medications: Lantus 8 units daily, Humalog 0-9 units TID with meals and at HS Current orders for Inpatient glycemic control:  Novolog 0-15 units tid + hs Levemir 11 units qhs Novolog 5 units tid meal coverage  A1c 13.2% on 9/18  Inpatient Diabetes Program Recommendations:    Pt with recent hospitalization prior to this one at [facility]. She snacked a lot in the evenings at that time. We also had patient on BID dosing of Levemir.  -   Consider adding Levemir 6 units qam.  -  May also need a custom Novolog carbohydrate coverage prn 0-5 units 1 unit for every 15 grams of carbohydrate for snack coverage.  1 unit:   15 grams of carbs   2 unit:   30 grams of carbs 3 unit:   45 grams of carbs 4 unit:   60 grams of carbs 5 unit:   75 grams of carbs  RN staff would need to count carbohydrates of snacks  Thanks,  Tama Headings RN, MSN, BC-ADM Inpatient Diabetes Coordinator Team Pager [phone] (8a-5p)

## 2021-11-01 NOTE — Plan of Care (Signed)
  Problem: Safety: Goal: Ability to remain free from injury will improve Outcome: Progressing   Problem: Education: Goal: Knowledge of disease or condition will improve Outcome: Progressing   Problem: Coping: Goal: Will identify appropriate support needs Outcome: Progressing   Problem: Health Behavior/Discharge Planning: Goal: Ability to manage health-related needs will improve Outcome: Progressing   Problem: Ischemic Stroke/TIA Tissue Perfusion: Goal: Complications of ischemic stroke/TIA will be minimized Outcome: Progressing

## 2021-11-02 DIAGNOSIS — R451 Restlessness and agitation: Secondary | ICD-10-CM

## 2021-11-02 DIAGNOSIS — I639 Cerebral infarction, unspecified: Secondary | ICD-10-CM

## 2021-11-02 DIAGNOSIS — R569 Unspecified convulsions: Secondary | ICD-10-CM | POA: Diagnosis not present

## 2021-11-02 DIAGNOSIS — R739 Hyperglycemia, unspecified: Secondary | ICD-10-CM | POA: Diagnosis not present

## 2021-11-02 LAB — BASIC METABOLIC PANEL
Anion gap: 11 (ref 5–15)
BUN: 17 mg/dL (ref 8–23)
CO2: 20 mmol/L — ABNORMAL LOW (ref 22–32)
Calcium: 8.8 mg/dL — ABNORMAL LOW (ref 8.9–10.3)
Chloride: 100 mmol/L (ref 98–111)
Creatinine, Ser: 0.73 mg/dL (ref 0.44–1.00)
GFR, Estimated: >60 mL/min (ref >60)
Glucose, Bld: 413 mg/dL — ABNORMAL HIGH (ref 70–99)
Potassium: 5.1 mmol/L (ref 3.5–5.1)
Sodium: 131 mmol/L — ABNORMAL LOW (ref 135–145)

## 2021-11-02 LAB — CBC
HCT: 34.5 % — ABNORMAL LOW (ref 36.0–46.0)
Hemoglobin: 10.9 g/dL — ABNORMAL LOW (ref 12.0–15.0)
MCH: 29.3 pg (ref 26.0–34.0)
MCHC: 31.6 g/dL (ref 30.0–36.0)
MCV: 92.7 fL (ref 80.0–100.0)
Platelets: 527 10*3/uL — ABNORMAL HIGH (ref 150–400)
RBC: 3.72 MIL/uL — ABNORMAL LOW (ref 3.87–5.11)
RDW: 15.4 % (ref 11.5–15.5)
WBC: 9 10*3/uL (ref 4.0–10.5)
nRBC: 0.2 % (ref 0.0–0.2)

## 2021-11-02 LAB — GLUCOSE, CAPILLARY
Glucose-Capillary: 319 mg/dL — ABNORMAL HIGH (ref 70–99)
Glucose-Capillary: 261 mg/dL — ABNORMAL HIGH (ref 70–99)
Glucose-Capillary: 160 mg/dL — ABNORMAL HIGH (ref 70–99)
Glucose-Capillary: 173 mg/dL — ABNORMAL HIGH (ref 70–99)

## 2021-11-02 MED ORDER — INSULIN ASPART 100 UNIT/ML IJ SOLN: 0.0000 [IU] | INTRAMUSCULAR | Status: DC

## 2021-11-02 MED ORDER — INSULIN ASPART 100 UNIT/ML IJ SOLN
6.0000 [IU] | Freq: Three times a day (TID) | INTRAMUSCULAR | Status: DC
  Administered 2021-11-02 – 2021-11-09 (×21): 6 [IU] via SUBCUTANEOUS

## 2021-11-02 MED ORDER — INSULIN ASPART 100 UNIT/ML IJ SOLN
0.0000 [IU] | INTRAMUSCULAR | Status: DC
  Administered 2021-11-02: 3 [IU] via SUBCUTANEOUS
  Administered 2021-11-03: 5 [IU] via SUBCUTANEOUS
  Administered 2021-11-03: 2 [IU] via SUBCUTANEOUS
  Administered 2021-11-03: 11 [IU] via SUBCUTANEOUS
  Administered 2021-11-03: 2 [IU] via SUBCUTANEOUS
  Administered 2021-11-03: 15 [IU] via SUBCUTANEOUS
  Administered 2021-11-04: 8 [IU] via SUBCUTANEOUS
  Administered 2021-11-04: 3 [IU] via SUBCUTANEOUS
  Administered 2021-11-04: 8 [IU] via SUBCUTANEOUS
  Administered 2021-11-04: 11 [IU] via SUBCUTANEOUS
  Administered 2021-11-05: 3 [IU] via SUBCUTANEOUS
  Administered 2021-11-05 (×2): 8 [IU] via SUBCUTANEOUS
  Administered 2021-11-05: 2 [IU] via SUBCUTANEOUS
  Administered 2021-11-06: 3 [IU] via SUBCUTANEOUS
  Administered 2021-11-06 (×2): 5 [IU] via SUBCUTANEOUS
  Administered 2021-11-06: 3 [IU] via SUBCUTANEOUS
  Administered 2021-11-06 (×2): 5 [IU] via SUBCUTANEOUS
  Administered 2021-11-07: 8 [IU] via SUBCUTANEOUS
  Administered 2021-11-07: 3 [IU] via SUBCUTANEOUS
  Administered 2021-11-07 (×3): 2 [IU] via SUBCUTANEOUS
  Administered 2021-11-07 (×2): 11 [IU] via SUBCUTANEOUS
  Administered 2021-11-08: 3 [IU] via SUBCUTANEOUS
  Administered 2021-11-08: 5 [IU] via SUBCUTANEOUS
  Administered 2021-11-08 (×2): 2 [IU] via SUBCUTANEOUS
  Administered 2021-11-08: 3 [IU] via SUBCUTANEOUS
  Administered 2021-11-08: 8 [IU] via SUBCUTANEOUS
  Administered 2021-11-09: 3 [IU] via SUBCUTANEOUS
  Administered 2021-11-09: 2 [IU] via SUBCUTANEOUS
  Administered 2021-11-09: 3 [IU] via SUBCUTANEOUS

## 2021-11-02 NOTE — Progress Notes (Signed)
Occupational Therapy Treatment Patient Details Name: Brittany [name] MRN: [id number] DOB: [birth year] Today's Date: 11/02/2021   History of present illness 61 yo female from ALF in [place] with onset of aphasia. pt with L temporal seizure due to hyperglycemia. CT and MRI (-) PMH DM, HTN lupus, prior PCA CVA, seizures, colostomy COPD pancreatic CA   OT comments  Pt supine in bed and agreeable to OT.  Engaged in ADL tasks in room, recalling 2/3 steps without cueing but max assist for last step.  Pt completing min assist toilet transfers, toileting and min guard for grooming.  Min assist for functional mobility using RW, cueing for hand placement during transfers and RW mgmt during mobility.  Pt sitting in recliner upon exit, eating 2nd lunch and RN notified.  Continue to recommend SNF.    Recommendations for follow up therapy are one component of a multi-disciplinary discharge planning process, led by the attending physician.  Recommendations may be updated based on patient status, additional functional criteria and insurance authorization.    Follow Up Recommendations  Skilled nursing-short term rehab (<3 hours/day)    Assistance Recommended at Discharge Frequent or constant Supervision/Assistance  Patient can return home with the following  A little help with walking and/or transfers;A little help with bathing/dressing/bathroom;Direct supervision/assist for medications management;Assist for transportation   Equipment Recommendations  BSC/3in1;Other (comment) (RW)    Recommendations for Other Services      Precautions / Restrictions Precautions Precautions: Fall Precaution Comments: ostomy Restrictions Weight Bearing Restrictions: No       Mobility Bed Mobility Overal bed mobility: Needs Assistance Bed Mobility: Supine to Sit     Supine to sit: Min guard     General bed mobility comments: for safety    Transfers Overall transfer level: Needs assistance Equipment  used: Rolling walker (2 wheels) Transfers: Sit to/from Stand Sit to Stand: Min assist           General transfer comment: cueing for hand placement and safety, poor carryover.     Balance Overall balance assessment: Mild deficits observed, not formally tested                                         ADL either performed or assessed with clinical judgement   ADL Overall ADL's : Needs assistance/impaired     Grooming: Wash/dry hands;Oral care;Min guard;Standing Grooming Details (indicate cue type and reason): min guard steadying, min cueing to find items in basket             Lower Body Dressing: Minimal assistance;Sit to/from stand Lower Body Dressing Details (indicate cue type and reason): to manage socks up with min guard, min assist in standing Toilet Transfer: Minimal assistance;Ambulation;Rolling walker (2 wheels);Regular Glass blower/designer Details (indicate cue type and reason): cueing for RW safety, min assist for transfers Toileting- Clothing Manipulation and Hygiene: Minimal assistance;Sit to/from stand Toileting - Clothing Manipulation Details (indicate cue type and reason): clothing mgmt (mesh underwear) and min guard steadying     Functional mobility during ADLs: Minimal assistance;Rolling walker (2 wheels)      Extremity/Trunk Assessment              Vision       Perception     Praxis      Cognition Arousal/Alertness: Awake/alert Behavior During Therapy: Flat affect Overall Cognitive Status: No family/caregiver present to determine baseline cognitive functioning  Area of Impairment: Attention, Memory, Following commands, Safety/judgement, Awareness, Problem solving                   Current Attention Level: Sustained Memory: Decreased short-term memory, Decreased recall of precautions Following Commands: Follows one step commands consistently, Follows one step commands with increased time, Follows multi-step commands  inconsistently Safety/Judgement: Decreased awareness of safety, Decreased awareness of deficits Awareness: Intellectual Problem Solving: Slow processing, Difficulty sequencing, Requires verbal cues General Comments: pt engaged in ADLs in room, given 3 step ADl task-- able to recall 2/3 steps but not 3rd with out max cueing. pt reporting what she had for lunch and getting 2nd lunch during session.        Exercises      Shoulder Instructions       General Comments      Pertinent Vitals/ Pain       Pain Assessment Pain Assessment: No/denies pain  Home Living                                          Prior Functioning/Environment              Frequency  Min 2X/week        Progress Toward Goals  OT Goals(current goals can now be found in the care plan section)  Progress towards OT goals: Progressing toward goals  Acute Rehab OT Goals Patient Stated Goal: eat lunch OT Goal Formulation: With patient Time For Goal Achievement: 11/13/21 Potential to Achieve Goals: Good  Plan Discharge plan remains appropriate;Frequency remains appropriate    Co-evaluation                 AM-PAC OT "6 Clicks" Daily Activity     Outcome Measure   Help from another person eating meals?: A Little Help from another person taking care of personal grooming?: A Little Help from another person toileting, which includes using toliet, bedpan, or urinal?: A Little Help from another person bathing (including washing, rinsing, drying)?: A Little Help from another person to put on and taking off regular upper body clothing?: A Little Help from another person to put on and taking off regular lower body clothing?: A Little 6 Click Score: 18    End of Session Equipment Utilized During Treatment: Rolling walker (2 wheels)  OT Visit Diagnosis: Unsteadiness on feet (R26.81);Muscle weakness (generalized) (M62.81)   Activity Tolerance Patient tolerated treatment well    Patient Left in chair;with call bell/phone within reach;with chair alarm set   Nurse Communication Mobility status        Time: 8657-8469 OT Time Calculation (min): 18 min  Charges: OT General Charges $OT Visit: 1 Visit OT Treatments $Self Care/Home Management : 8-22 mins  Brittany [name], OT Acute Rehabilitation Services Office [phone]   Brittany [name] 11/02/2021, 1:39 PM

## 2021-11-02 NOTE — Progress Notes (Signed)
Mobility Specialist: Progress Note   11/02/21 1213  Mobility  Activity Ambulated with assistance in hallway  Level of Assistance Contact guard assist, steadying assist  Assistive Device Front wheel walker  Distance Ambulated (ft) 250 ft  Activity Response Tolerated well  $Mobility charge 1 Mobility   Pt received sitting EOB and agreeable to mobility. No c/o throughout ambulation. Pt drifting R some during ambulation requiring verbal cues to correct as well as for upright posture. Pt sitting EOB after session with call bell at her side.   [facility] Brittany [name] Mobility Specialist Mobility Specialist [address]: [phone]

## 2021-11-02 NOTE — Inpatient Diabetes Management (Addendum)
Inpatient Diabetes Program Recommendations  AACE/ADA: New Consensus Statement on Inpatient Glycemic Control (2015)  Target Ranges:  Prepandial:   less than 140 mg/dL      Peak postprandial:   less than 180 mg/dL (1-2 hours)      Critically ill patients:  140 - 180 mg/dL   Lab Results  Component Value Date   GLUCAP 319 (H) 11/02/2021   HGBA1C 13.2 (H) 10/30/2021    Review of Glycemic Control  Latest Reference Range & Units 11/01/21 08:56 11/01/21 13:05 11/01/21 16:26 11/01/21 22:15 11/02/21 06:49  Glucose-Capillary 70 - 99 mg/dL 334 (H) 226 (H) 231 (H) 349 (H) 319 (H)   Diabetes history: DM1 (does NOT make any insulin; requires basal, correction, and carb coverage insulin) Outpatient Diabetes medications: Lantus 8 units daily, Humalog 0-9 units TID with meals and at HS Current orders for Inpatient glycemic control:  Novolog 0-15 units tid + hs Levemir 11 units qhs, 6 units qam Novolog 5 units tid meal coverage  A1c 13.2% on 9/18  Inpatient Diabetes Program Recommendations:    Pt with recent hospitalization prior to this one at [facility]. She snacked a lot in the evenings at that time.   -   Increase Novolog meal coverage to 6 units tid  -  Change Novolog correction scale to Q4 hour to cover night time glucose trends  Thanks,  Tama Headings RN, MSN, BC-ADM Inpatient Diabetes Coordinator Team Pager [phone] (8a-5p)

## 2021-11-02 NOTE — Progress Notes (Addendum)
STROKE TEAM PROGRESS NOTE   SUBJECTIVE (INTERVAL HISTORY) [name].  She continues to perseverate on food.  She is doing well without wrist restraints and does not appear agitated.  She has been hemodynamically stable and her neurological exam is stable.  OBJECTIVE Temp:  [97.4 F (36.3 C)-99.2 F (37.3 C)] 97.8 F (36.6 C) (09/21 1100) Pulse Rate:  [97-110] 97 (09/21 1100) Resp:  [18-20] 18 (09/21 1100) BP: (103-145)/(69-95) 131/93 (09/21 1100) SpO2:  [99 %-100 %] 100 % (09/21 1100)  Recent Labs  Lab 11/01/21 1305 11/01/21 1626 11/01/21 2215 11/02/21 0649 11/02/21 1134  GLUCAP 226* 231* 349* 319* 261*    Recent Labs  Lab 10/30/21 0300 10/30/21 1436 10/31/21 0301 11/01/21 0949 11/02/21 0750  NA 134* 135 136 135 131*  K 5.4* 4.5 3.9 4.2 5.1  CL 101 105 106 103 100  CO2 20* '23 23 22 '$ 20*  GLUCOSE 305* 201* 186* 377* 413*  BUN '16 21 20 18 17  '$ CREATININE 0.99 0.86 0.60 0.82 0.73  CALCIUM 8.9 8.8* 9.2 9.3 8.8*    Recent Labs  Lab 10/28/21 1140  AST 45*  ALT 50*  ALKPHOS 190*  BILITOT 0.5  PROT 8.2*  ALBUMIN 3.1*    Recent Labs  Lab 10/28/21 1140 10/28/21 1145 10/28/21 1311 10/30/21 1004 10/31/21 0301 11/01/21 0949 11/02/21 0750  WBC 6.7  --   --  7.4 8.6 7.9 9.0  NEUTROABS 3.3  --   --   --   --   --   --   HGB 11.1*   < > 12.6 10.4* 10.4* 11.4* 10.9*  HCT 35.4*   < > 37.0 31.1* 32.3* 34.7* 34.5*  MCV 94.9  --   --  88.9 90.7 91.3 92.7  PLT 419*  --   --  477* 454* 497* 527*   < > = values in this interval not displayed.    No results for input(s): "CKTOTAL", "CKMB", "CKMBINDEX", "TROPONINI" in the last 168 hours. No results for input(s): "LABPROT", "INR" in the last 72 hours.  No results for input(s): "COLORURINE", "LABSPEC", "PHURINE", "GLUCOSEU", "HGBUR", "BILIRUBINUR", "KETONESUR", "PROTEINUR", "UROBILINOGEN", "NITRITE", "LEUKOCYTESUR" in the last 72 hours.  Invalid input(s): "APPERANCEUR"      Component Value Date/Time    CHOL 99 10/29/2021 0048   TRIG 40 10/29/2021 0048   HDL 27 (L) 10/29/2021 0048   CHOLHDL 3.7 10/29/2021 0048   VLDL 8 10/29/2021 0048   LDLCALC 64 10/29/2021 0048   Lab Results  Component Value Date   HGBA1C 13.2 (H) 10/30/2021      Component Value Date/Time   LABOPIA NONE DETECTED 10/29/2021 1744   COCAINSCRNUR NONE DETECTED 10/29/2021 1744   COCAINSCRNUR NONE DETECTED 08/13/2021 1717   LABBENZ NONE DETECTED 10/29/2021 1744   AMPHETMU NONE DETECTED 10/29/2021 1744   THCU NONE DETECTED 10/29/2021 1744   LABBARB NONE DETECTED 10/29/2021 1744    Recent Labs  Lab 10/28/21 1140  ETH <10     I have personally reviewed the radiological images below and agree with the radiology interpretations.  Overnight EEG with video  Result Date: 10/30/2021 Brittany Havens, MD     10/30/2021 10:21 AM Patient Name: Brittany [name] MRN: [id number] Epilepsy Attending: Lora [name] Referring Physician/Provider: Rosalin Hawking, MD Duration: 10/29/2021 1456 to 10/30/2021 1012 Patient history: 61 y.o. 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 EEG  to evaluate for seizure Level of alertness: Awake, asleep AEDs during EEG study: Ativan, LCM Technical aspects: This EEG study was done with scalp electrodes positioned according to the 10-20 International system of electrode placement. Electrical activity was reviewed with band pass filter of 1-'70Hz'$ , sensitivity of 7 uV/mm, display speed of 46m/sec with a '60Hz'$  notched filter applied as appropriate. EEG data were recorded continuously and digitally stored.  Video monitoring was available and reviewed as appropriate. Description: The posterior dominant rhythm consists of 9 Hz activity of moderate voltage (25-35 uV) seen predominantly in posterior head regions, asymmetric ( left<right) and reactive to eye opening and eye closing.  Sleep was characterized by vertex waves, sleep spindles (12 to 14 Hz), maximal frontocentral region. EEG also showed intermittent 3 to 5 Hz theta -delta slowing. Spikes were noted in left temporal region  Hyperventilation and photic stimulation were not performed.   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 There is also cortical dysfunction arising from left temporal region likely secondary to underlying structural abnormality. No seizures were seen throughout the recording. PLora [name]  ECHOCARDIOGRAM COMPLETE  Result Date: 10/29/2021    ECHOCARDIOGRAM REPORT   Patient Name:   Brittany EDDINGERDate of Exam: 10/29/2021 Medical Rec #:  [id number]    Height:       60.0 in Accession #:    [id number]   Weight:       123.9 lb Date of Birth:  1May 09, 1962   BSA:          1.523 m Patient Age:    89years      BP:           130/95 mmHg Patient Gender: F             HR:           86 bpm. Exam Location:  Inpatient Procedure: 2D Echo, Color Doppler and Cardiac Doppler Indications:    Stroke  History:        Patient has prior history of Echocardiogram examinations, most                 recent 11/06/2020. Risk Factors:Hypertension and Diabetes. Lupus.  Sonographer:    AClayton LefortRDCS (AE) Referring Phys: AN[place]angua 1. Left ventricular ejection fraction, by estimation, is 55 to 60%. The left ventricle has normal function. The left ventricle has no regional wall motion abnormalities. There is mild asymmetric left ventricular hypertrophy of the basal segment. Left ventricular diastolic parameters are consistent with Grade I diastolic dysfunction (impaired relaxation).  2. Right ventricular systolic function is low normal. The right ventricular size is normal. Tricuspid regurgitation signal is inadequate for assessing PA pressure.  3.  The mitral valve is abnormal. Trivial mitral valve regurgitation. Moderate mitral annular calcification.  4. The aortic valve is tricuspid. Aortic valve regurgitation is not visualized.  5. The inferior vena cava is normal in size with greater than 50% respiratory variability, suggesting right atrial pressure of 3 mmHg. Comparison(s): Prior images reviewed side by side. LVEF remains normal at 55-60%. FINDINGS  Left Ventricle: Left ventricular ejection fraction, by estimation, is 55 to 60%. The left ventricle has normal function. The left ventricle has no regional wall motion abnormalities. The left ventricular internal cavity size was normal in size. There is  mild  asymmetric left ventricular hypertrophy of the basal segment. Left ventricular diastolic parameters are consistent with Grade I diastolic dysfunction (impaired relaxation). Right Ventricle: The right ventricular size is normal. No increase in right ventricular wall thickness. Right ventricular systolic function is low normal. Tricuspid regurgitation signal is inadequate for assessing PA pressure. Left Atrium: Left atrial size was normal in size. Right Atrium: Right atrial size was normal in size. Pericardium: There is no evidence of pericardial effusion. Mitral Valve: The mitral valve is abnormal. There is mild thickening of the mitral valve leaflet(s). There is mild calcification of the mitral valve leaflet(s). Moderate mitral annular calcification. Trivial mitral valve regurgitation. Tricuspid Valve: The tricuspid valve is grossly normal. Tricuspid valve regurgitation is trivial. Aortic Valve: The aortic valve is tricuspid. There is mild aortic valve annular calcification. Aortic valve regurgitation is not visualized. Aortic valve mean gradient measures 1.0 mmHg. Aortic valve peak gradient measures 2.5 mmHg. Aortic valve area, by  VTI measures 4.08 cm. Pulmonic Valve: The pulmonic valve was grossly normal. Pulmonic valve regurgitation is trivial.  Aorta: The aortic root is normal in size and structure. Venous: The inferior vena cava is normal in size with greater than 50% respiratory variability, suggesting right atrial pressure of 3 mmHg. IAS/Shunts: No atrial level shunt detected by color flow Doppler.  LEFT VENTRICLE PLAX 2D LVIDd:         3.20 cm   Diastology LVIDs:         2.20 cm   LV e' medial:    6.09 cm/s LV PW:         1.10 cm   LV E/e' medial:  13.7 LV IVS:        1.20 cm   LV e' lateral:   5.98 cm/s LVOT diam:     2.30 cm   LV E/e' lateral: 14.0 LV SV:         47 LV SV Index:   31 LVOT Area:     4.15 cm  RIGHT VENTRICLE RV Basal diam:  2.40 cm RV S prime:     6.85 cm/s TAPSE (M-mode): 1.6 cm LEFT ATRIUM             Index        RIGHT ATRIUM          Index LA diam:        2.20 cm 1.44 cm/m   RA Area:     7.79 cm LA Vol (A2C):   12.7 ml 8.34 ml/m   RA Volume:   13.50 ml 8.86 ml/m LA Vol (A4C):   25.1 ml 16.48 ml/m LA Biplane Vol: 19.3 ml 12.67 ml/m  AORTIC VALVE AV Area (Vmax):    3.29 cm AV Area (Vmean):   3.18 cm AV Area (VTI):     4.08 cm AV Vmax:           78.40 cm/s AV Vmean:          53.100 cm/s AV VTI:            0.115 m AV Peak Grad:      2.5 mmHg AV Mean Grad:      1.0 mmHg LVOT Vmax:         62.00 cm/s LVOT Vmean:        40.600 cm/s LVOT VTI:          0.113 m LVOT/AV VTI ratio: 0.98  AORTA Ao Root diam: 3.30 cm Ao Asc diam:  3.20 cm MITRAL VALVE MV  Area (PHT): 4.96 cm     SHUNTS MV Decel Time: 153 msec     Systemic VTI:  0.11 m MV E velocity: 83.70 cm/s   Systemic Diam: 2.30 cm MV A velocity: 119.00 cm/s MV E/A ratio:  0.70 Rozann Lesches MD Electronically signed by Rozann Lesches MD Signature Date/Time: 10/29/2021/3:08:29 PM    Final    MR BRAIN WO CONTRAST  Result Date: 10/29/2021 CLINICAL DATA:  Acute neuro deficit. EXAM: MRI HEAD WITHOUT CONTRAST TECHNIQUE: Multiplanar, multiecho pulse sequences of the brain and surrounding structures were obtained without intravenous contrast. COMPARISON:  CT head 10/28/2021 FINDINGS:  Brain: Limited MRI. The patient was not able to hold still and was scanned in the lateral decubitus position. After several sequences, the patient left the scanner and the study was terminated. Diffusion-weighted imaging is diagnostic with mild motion. No acute infarct identified Chronic infarct left cerebellum and left occipital lobe as noted on prior studies. Chronic microvascular ischemic change also noted in the white matter. No hydrocephalus. IMPRESSION: Limited and incomplete study.  No acute infarct. Electronically Signed   By: Franchot Gallo M.D.   On: 10/29/2021 11:11   EEG adult  Result Date: 10/28/2021 Greta Doom, MD     10/28/2021  5:54 PM History: Concern for seizure Sedation: none Technique: This EEG was acquired with electrodes placed according to the International 10-20 electrode system (including Fp1, Fp2, F3, F4, C3, C4, P3, P4, O1, O2, T3, T4, T5, T6, A1, A2, Fz, Cz, Pz). The following electrodes were missing or displaced: none. Background: The background consists of intermixed alpha and beta activities. There is a well defined posterior dominant rhythm of 9 Hz that is better seen on the right than left. Sleep is not recorded. Photic stimulation: Physiologic driving is not performed EEG Abnormalities: 1) Assymetric PDR Clinical Interpretation: This EEG recorded evidence of a left posterior cortical dysfunction consistent with the patient's know parieto-occipital infarct. There was no seizure or seizure predisposition recorded on this study. Please note that lack of epileptiform activity on EEG does not preclude the possibility of epilepsy. Roland Rack, MD [facility] [phone] If 7pm- 7am, please page neurology on call as listed in [place].   DG Chest Portable 1 View  Result Date: 10/28/2021 CLINICAL DATA:  Altered level of consciousness EXAM: PORTABLE CHEST 1 VIEW COMPARISON:  09/21/2021 FINDINGS: Single frontal view of the chest demonstrates a stable  cardiac silhouette. Chronic bibasilar interstitial prominence and linear areas of consolidation, right greater than left. No new airspace disease, effusion, or pneumothorax. No acute bony abnormalities. IMPRESSION: 1. Chronic bibasilar interstitial prominence with scattered bibasilar consolidation, which has been stable since the 06/25/2021 exam. Findings are likely sequela of postinflammatory scarring or chronic aspiration pneumonia. 2. Otherwise unremarkable chest x-ray. Electronically Signed   By: Randa Ngo M.D.   On: 10/28/2021 15:02   CT ANGIO HEAD NECK W WO CM W PERF (CODE STROKE)  Result Date: 10/28/2021 CLINICAL DATA:  61 year old female code stroke presentation. Chronic infarcts. EXAM: CT ANGIOGRAPHY HEAD AND NECK TECHNIQUE: Multidetector CT imaging of the head and neck was performed using the standard protocol during bolus administration of intravenous contrast. Multiplanar CT image reconstructions and MIPs were obtained to evaluate the vascular anatomy. Carotid stenosis measurements (when applicable) are obtained utilizing NASCET criteria, using the distal internal carotid diameter as the denominator. CT Cerebral Perfusion also performed, with planned analysis by RAPID software, but the study is nondiagnostic due to patient motion. RADIATION DOSE REDUCTION: This exam was performed  according to the departmental dose-optimization program which includes automated exposure control, adjustment of the mA and/or kV according to patient size and/or use of iterative reconstruction technique. CONTRAST:  144m OMNIPAQUE IOHEXOL 350 MG/ML SOLN COMPARISON:  Plain head CT 1148 hours today. FINDINGS: CT Perfusion: Nondiagnostic due to pronounced patient motion. CTA NECK Skeleton: Cervical spine degeneration. No acute osseous abnormality identified. Upper chest: Centrilobular emphysema. Mild upper lung atelectasis. No superior mediastinal lymphadenopathy. Other neck: No neck mass or lymphadenopathy identified.  Aortic arch: Calcified aortic atherosclerosis. Aberrant origin of the right subclavian artery, 4 vessel arch configuration. Right carotid system: Mildly tortuous proximal right CCA. Right CCA and right ICA intermittent plaque with no significant stenosis to the skull base. Left carotid system: Mildly tortuous proximal left CCA. Less pronounced left carotid plaque in the neck with no stenosis to the skull base. Vertebral arteries: Aberrant origin proximal right subclavian artery with atherosclerosis but no significant stenosis. Right vertebral artery origin is normal. Right vertebral artery is non dominant and diminutive but patent to the skull base with no focal stenosis identified. Proximal left subclavian artery soft and calcified plaque without significant stenosis. Calcified plaque at the left vertebral artery origin but no significant stenosis. Tortuous left V1 segment. Dominant left vertebral artery is patent to the skull base with only mild stenosis. CTA HEAD Posterior circulation: Dominant left V4 segment. Patent PICA origins and no distal vertebral or vertebrobasilar junction stenosis. Patent basilar artery without stenosis. Patent SCA and PCA origins. Right posterior communicating artery is present. Smaller left posterior communicating artery is present. There is up to moderate irregularity of the left PCA with attenuated distal PCA branches, chronic infarct in that vascular territory. Right PCA branches are patent with only mild irregularity. Anterior circulation: Both ICA siphons are patent. Mild siphon plaque, no siphon stenosis. Normal posterior communicating artery origins. Patent carotid termini, MCA and ACA origins. Tortuous A1 segments. Anterior communicating artery and bilateral ACA branches are within normal limits. Right MCA M1 segment and bifurcation are patent with only mild tortuosity and irregularity. No right MCA branch occlusion. Left MCA M1 segment and bifurcation are patent with only  mild irregularity. No left MCA branch occlusion is identified. Venous sinuses: Early contrast timing, not well evaluated. Anatomic variants: Dominant left vertebral artery. Aberrant origin of the right subclavian artery. Fetal type right PCA origin. Review of the MIP images confirms the above findings IMPRESSION: 1. Negative for emergent large vessel occlusion. Nondiagnostic CT Perfusion due to patient motion. 2. Irregular and attenuated Left PCA branches compatible with chronic infarct in that territory. 3. Otherwise generally mild for age atherosclerosis in the head and neck. No other significant arterial stenosis identified. 4. More advanced Aortic Atherosclerosis (ICD10-I70.0). 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 5. Emphysema (ICD10-J43.9). Preliminary CTA results were discussed by telephone with Neurology Dr. SQuinn Axeon 10/28/2021 at 1214 hours. Electronically Signed   By: HGenevie AnnM.D.   On: 10/28/2021 12:35   CT HEAD CODE STROKE WO CONTRAST  Result Date: 10/28/2021 CLINICAL DATA:  Code stroke. 61year old female with history of multiple prior infarcts. New aphasia. Possible seizure activity. EXAM: CT HEAD WITHOUT CONTRAST TECHNIQUE: Contiguous axial images were obtained from the base of the skull through the vertex without intravenous contrast. RADIATION DOSE REDUCTION: This exam was performed according to the departmental dose-optimization program which includes automated exposure control, adjustment of the mA and/or kV according to patient size and/or use of iterative reconstruction technique. COMPARISON:  Head CT 09/21/2021. FINDINGS: Brain: Multifocal  chronic infarcts and encephalomalacia appears stable by CT since 09/21/2021. This includes involvement in the anterior right MCA territory, extensive left temporal and occipital lobe encephalomalacia, extensive left cerebellar encephalomalacia. Additional confluent bilateral cerebral white matter hypodensity including deep white matter  capsule involvement. No acute intracranial hemorrhage identified. No midline shift, mass effect, or evidence of intracranial mass lesion. No ventriculomegaly. No cortically based acute infarct identified. Vascular: Calcified atherosclerosis at the skull base. No suspicious intracranial vascular hyperdensity. Skull: No acute osseous abnormality identified. Sinuses/Orbits: Visualized paranasal sinuses and mastoids are stable and well aerated. Other: No acute orbit or scalp soft tissue finding. ASPECTS [facility] Stroke Program Early CT Score) Total score (0-10 with 10 being normal): 10, chronic encephalomalacia. IMPRESSION: 1. Stable CT appearance of advanced ischemic disease since last month. 2. No acute cortically based infarct or acute intracranial hemorrhage identified. ASPECTS 10. 3. Study discussed by telephone with Dr. Quinn Axe on 10/28/2021 at 1155 hours. Electronically Signed   By: Genevie Ann M.D.   On: 10/28/2021 11:58     PHYSICAL EXAM  Temp:  [97.4 F (36.3 C)-99.2 F (37.3 C)] 97.8 F (36.6 C) (09/21 1100) Pulse Rate:  [97-110] 97 (09/21 1100) Resp:  [18-20] 18 (09/21 1100) BP: (103-145)/(69-95) 131/93 (09/21 1100) SpO2:  [99 %-100 %] 100 % (09/21 1100)  General - Well nourished, well developed, calm and cooperative with exam today    Neuro - awake, alert, eyes open, perseverates on food but is able to answer some questions today.  Oriented to self and place, able to follow simple and two step commands. No gaze palsy, tracking bilaterally. No facial droop. Moving all extremities strong equally, Sensation intact to light touch, FNF intact bilaterally.   ASSESSMENT/PLAN Brittany [name] is a 61 y.o. 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  TNK given.  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 No seizures were seen throughout the recording. 2D Echo EF 55 to 60% LDL 64 HgbA1c 13.2 UDS negative SCDs for VTE prophylaxis aspirin 81 mg daily prior to admission, now on aspirin 81 mg daily.  Increased Vimpat to 100 twice daily Ongoing aggressive stroke risk factor management Therapy recommendations: SNF back to [facility] Disposition: Pending  History of seizure 03/2021 admitted for confusion, possible seizure and possible COVID.  EEG showed LPD with brief ictal discharge.  Long-term EEG showed left temporal epileptogenicity, discharged on Keppra 500 Per pharmacy, patient did not refill Keppra  History of stroke 07/2019 admitted for nausea vomiting, headache and generalized weakness.  Found to have DKA.  MRI showed left inferior cerebellum infarct.  LDL 153, A1c 13.5.  Carotid Doppler negative.  EF 55 to 60%. 10/2020 admitted for altered mental status, slurred speech, dysphagia and aphasia with right-sided mild weakness.  CT showed left PCA infarct.  MRI showed early subacute left PCA infarct, old bilateral BG lacunar infarct, left cerebellum old infarct.  Carotid Doppler negative, MRA negative.  EF 65 to 70%.  Discharged on DAPT.  Agitation and combative ? 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 Continue Seroquel Off restrain today  Psychiatry recs- depakote and zyprexa in addition to seroquel On depakote 250 bid Zyprexa PRN  Diabetes, uncontrolled HgbA1c 13.2 goal < 7.0 Hyperglycemia on presentation, glucose 450 Uncontrolled Currently on Levemir BID 6 am/11hs CBG monitoring SSI Q4h now SSI with 6u Novolog with meals added DM education and close PCP follow up  Hypertension Home meds Cardizem 120 Stable Long term BP goal normotensive  Hyperlipidemia Home meds:  Lipitor 40 LDL 64, goal < 70 Now on Lipitor 40 Continue statin at discharge  Tobacco abuse Current smoker Smoking cessation counseling will be provided Pt is willing to quit  Other Stroke Risk Factors   Other Active Problems Lower back pain Discoid lupus 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 MD to update note as needed.   [place] , MSN, AGACNP-BC [facility] See Amion for schedule and pager information 11/02/2021 12:48 PM  ATTENDING NOTE: I reviewed above note and agree with the assessment and plan. Pt was seen and examined.   No family at bedside.  No acute event overnight.  Patient less agitated, off restraint.  Hopefully able to remain off restrain.  Continue Vimpat, Depakote.  Adjusting sliding scale as for hypoglycemia.  Appreciate diabetic coordinator assistance.  Pending SNF.  For detailed assessment and plan, please refer to above/below as I have made changes wherever appropriate.   Brittany Hawking, MD PhD Stroke Neurology 11/02/2021 6:11 PM    To contact Stroke Continuity provider, please refer to [email or web address] After hours, contact General Neurology

## 2021-11-03 DIAGNOSIS — R569 Unspecified convulsions: Secondary | ICD-10-CM | POA: Diagnosis not present

## 2021-11-03 LAB — GLUCOSE, CAPILLARY
Glucose-Capillary: 134 mg/dL — ABNORMAL HIGH (ref 70–99)
Glucose-Capillary: 137 mg/dL — ABNORMAL HIGH (ref 70–99)
Glucose-Capillary: 189 mg/dL — ABNORMAL HIGH (ref 70–99)
Glucose-Capillary: 210 mg/dL — ABNORMAL HIGH (ref 70–99)
Glucose-Capillary: 340 mg/dL — ABNORMAL HIGH (ref 70–99)
Glucose-Capillary: 363 mg/dL — ABNORMAL HIGH (ref 70–99)
Glucose-Capillary: 97 mg/dL (ref 70–99)

## 2021-11-03 LAB — CBC
HCT: 33.1 % — ABNORMAL LOW (ref 36.0–46.0)
Hemoglobin: 10.9 g/dL — ABNORMAL LOW (ref 12.0–15.0)
MCH: 30.1 pg (ref 26.0–34.0)
MCHC: 32.9 g/dL (ref 30.0–36.0)
MCV: 91.4 fL (ref 80.0–100.0)
Platelets: 493 10*3/uL — ABNORMAL HIGH (ref 150–400)
RBC: 3.62 MIL/uL — ABNORMAL LOW (ref 3.87–5.11)
RDW: 15.2 % (ref 11.5–15.5)
WBC: 8.5 10*3/uL (ref 4.0–10.5)
nRBC: 0 % (ref 0.0–0.2)

## 2021-11-03 LAB — BASIC METABOLIC PANEL
Anion gap: 10 (ref 5–15)
BUN: 15 mg/dL (ref 8–23)
CO2: 21 mmol/L — ABNORMAL LOW (ref 22–32)
Calcium: 9 mg/dL (ref 8.9–10.3)
Chloride: 106 mmol/L (ref 98–111)
Creatinine, Ser: 0.77 mg/dL (ref 0.44–1.00)
GFR, Estimated: >60 mL/min (ref >60)
Glucose, Bld: 263 mg/dL — ABNORMAL HIGH (ref 70–99)
Potassium: 4.6 mmol/L (ref 3.5–5.1)
Sodium: 137 mmol/L (ref 135–145)

## 2021-11-03 MED ORDER — DIVALPROEX SODIUM 250 MG PO DR TAB: 250.0000 mg | DELAYED_RELEASE_TABLET | Freq: Two times a day (BID) | ORAL | 0 refills | Status: DC

## 2021-11-03 MED ORDER — LACOSAMIDE 100 MG PO TABS: 100.0000 mg | ORAL_TABLET | Freq: Two times a day (BID) | ORAL | 0 refills | Status: DC

## 2021-11-03 MED ORDER — HYDROXYZINE HCL 10 MG PO TABS: 10.0000 mg | ORAL_TABLET | Freq: Three times a day (TID) | ORAL | 0 refills | Status: DC | PRN

## 2021-11-03 NOTE — Progress Notes (Signed)
Physical Therapy Treatment Patient Details Name: Brittany [name] MRN: [id number] DOB: 1960-12-09 Today's Date: 11/03/2021   History of Present Illness 61 yo female from ALF in [place] with onset of aphasia. pt with L temporal seizure due to hyperglycemia. CT and MRI (-) PMH DM, HTN lupus, prior PCA CVA, seizures, colostomy COPD pancreatic CA    PT Comments    Patient lethargic at start but more alert once sitting EOB. Min guard with extra time required to move to EOB. Min assist for sit<>stand and pt agreeable to ambulate short bout in room and to sink. Pt with flexed posture throughout but able to complete oral hygiene with set up assist and external support of sink for balance. Short seated rest provided prior to ambulation back to bed. Pt decline sitting up in recliner for increased time OOB despite encouragement and education on benefits. Will continue to progress as able.    Recommendations for follow up therapy are one component of a multi-disciplinary discharge planning process, led by the attending physician.  Recommendations may be updated based on patient status, additional functional criteria and insurance authorization.  Follow Up Recommendations  Home health PT (If facility can manage assist level. If not, will need SNF.)     Assistance Recommended at Discharge Frequent or constant Supervision/Assistance  Patient can return home with the following A little help with walking and/or transfers;A little help with bathing/dressing/bathroom;Assistance with cooking/housework;Direct supervision/assist for medications management;Direct supervision/assist for financial management;Assist for transportation;Help with stairs or ramp for entrance   Equipment Recommendations  None recommended by PT    Recommendations for Other Services       Precautions / Restrictions Precautions Precautions: Fall Precaution Comments: ostomy Restrictions Weight Bearing Restrictions: No      Mobility  Bed Mobility Overal bed mobility: Needs Assistance Bed Mobility: Supine to Sit     Supine to sit: Min guard     General bed mobility comments: guarding for safety.    Transfers Overall transfer level: Needs assistance Equipment used: Rolling walker (2 wheels) Transfers: Sit to/from Stand Sit to Stand: Min assist           General transfer comment: VC's for technique/safe hand placement for power up. no overt LOB noted. gaurding for safety, pt able to initiate power up and rise from EOB and standard chair.    Ambulation/Gait Ambulation/Gait assistance: Min assist Gait Distance (Feet): 16 Feet (2x8) Assistive device: Rolling walker (2 wheels) Gait Pattern/deviations: Decreased stride length, Shuffle, Wide base of support Gait velocity: decr     General Gait Details: pt with shuffled low steps and assist needed to maintain safe proximity to RW, multimodal cues for posture to improve upright position. Pt amb to sink with short seated rest priro to amb back to bed.   Stairs             Wheelchair Mobility    Modified Rankin (Stroke Patients Only)       Balance Overall balance assessment: Needs assistance Sitting-balance support: Feet supported Sitting balance-Leahy Scale: Fair     Standing balance support: During functional activity, Bilateral upper extremity supported Standing balance-Leahy Scale: Poor Standing balance comment: completed self care at sink: brush teeth, was face. pt reliant on counter for support.                            Cognition Arousal/Alertness: Awake/alert Behavior During Therapy: Flat affect Overall Cognitive Status: No family/caregiver present to  determine baseline cognitive functioning Area of Impairment: Attention, Memory, Following commands, Safety/judgement, Awareness, Problem solving                   Current Attention Level: Sustained Memory: Decreased short-term memory, Decreased recall  of precautions Following Commands: Follows one step commands consistently, Follows one step commands with increased time Safety/Judgement: Decreased awareness of safety, Decreased awareness of deficits Awareness: Intellectual Problem Solving: Slow processing, Difficulty sequencing, Requires verbal cues, Decreased initiation, Requires tactile cues General Comments: pt lethargic at start but more alert once sitting upright        Exercises      General Comments        Pertinent Vitals/Pain Pain Assessment Pain Assessment: No/denies pain    Home Living                          Prior Function            PT Goals (current goals can now be found in the care plan section) Acute Rehab PT Goals Patient Stated Goal: Pt motivated for more food. No real goals stated during session. PT Goal Formulation: Patient unable to participate in goal setting Time For Goal Achievement: 11/07/21 Potential to Achieve Goals: Good Progress towards PT goals: Progressing toward goals    Frequency    Min 2X/week      PT Plan Current plan remains appropriate    Co-evaluation              AM-PAC PT "6 Clicks" Mobility   Outcome Measure  Help needed turning from your back to your side while in a flat bed without using bedrails?: None Help needed moving from lying on your back to sitting on the side of a flat bed without using bedrails?: A Little Help needed moving to and from a bed to a chair (including a wheelchair)?: A Little Help needed standing up from a chair using your arms (e.g., wheelchair or bedside chair)?: A Little Help needed to walk in hospital room?: A Little Help needed climbing 3-5 steps with a railing? : A Lot 6 Click Score: 18    End of Session Equipment Utilized During Treatment: Gait belt Activity Tolerance: Patient tolerated treatment well Patient left: in bed;with call bell/phone within reach;with bed alarm set Nurse Communication: Mobility status PT  Visit Diagnosis: Unsteadiness on feet (R26.81);Other abnormalities of gait and mobility (R26.89);Difficulty in walking, not elsewhere classified (R26.2)     Time: 1696-7893 PT Time Calculation (min) (ACUTE ONLY): 22 min  Charges:  $Gait Training: 8-22 mins                     Verner Mould, DPT Acute Rehabilitation Services Office [phone] Pager [phone]  11/03/21 12:35 PM

## 2021-11-03 NOTE — Discharge Summary (Incomplete)
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 Swallow whole.   atorvastatin 40 MG tablet Commonly known as: LIPITOR Take 1 tablet (40 mg total) by mouth Nightly. What changed: when to take this   diltiazem 120 MG 24 hr capsule Commonly known as: CARDIZEM CD Take 1 capsule (120 mg total) by mouth daily.   divalproex 500 MG DR tablet Commonly known as: DEPAKOTE Take 1 tablet (500 mg total) by mouth 2 (two) times daily.   famotidine 20 MG tablet Commonly known as: PEPCID Take 1 tablet (20 mg total) by mouth daily.   feeding supplement (GLUCERNA SHAKE) Liqd Take 237 mLs by mouth 3 (three) times daily between meals.   hydrOXYzine 10 MG tablet Commonly known as: ATARAX Take 1 tablet (10 mg total) by mouth 3 (three) times daily as needed for itching.   insulin aspart 100 UNIT/ML injection Commonly known as: novoLOG Inject 5 Units into the skin 3 (three) times daily with meals. If eats more than 5)% of meals What changed: additional instructions   insulin detemir 100 UNIT/ML injection Commonly known as: LEVEMIR Inject 0.08 mLs (8 Units total) into the skin 2 (two) times daily. What changed:  how much to take when to  take this   insulin lispro 100 UNIT/ML injection Commonly known as: HumaLOG 0-9 units TID with meals, and 0-5 units QHS bases on sliding scale previously prescribed to patient.   Lacosamide 100 MG Tabs Take 1 tablet (100 mg total) by mouth 2 (two) times daily.   melatonin 5 MG Tabs Take 0.5 tablets (2.5 mg total) by mouth at bedtime.   mirtazapine 15 MG tablet Commonly known as: REMERON Take 15 mg by mouth at bedtime.   multivitamin with minerals Tabs tablet Take 1 tablet by mouth daily.   nicotine 21 mg/24hr patch Commonly known as: NICODERM CQ - dosed in mg/24 hours Place 1 patch (21 mg total) onto the skin daily as needed (nicotine craving).   QUEtiapine 25 MG tablet Commonly known as: SEROQUEL Take 1 tablet (25 mg total) by mouth at bedtime.   rOPINIRole 0.5 MG tablet Commonly known as: REQUIP Take 0.5 mg by mouth at bedtime.   zinc sulfate 220 (50 Zn) MG capsule Take 1 capsule (220 mg total) by mouth daily.               Durable Medical Equipment  (From admission, onward)           Start     Ordered   11/08/21 1426  For home use only DME Walker rolling  Once       Question Answer  Comment  Walker: With 5 Inch Wheels   Patient needs a walker to treat with the following condition Stroke ([place])      11/08/21 1427            LABORATORY STUDIES CBC    Component Value Date/Time   WBC 8.5 11/03/2021 0232   RBC 3.62 (L) 11/03/2021 0232   HGB 10.9 (L) 11/03/2021 0232   HGB 13.9 08/17/2011 1815   HCT 33.1 (L) 11/03/2021 0232   HCT 42.1 08/17/2011 1815   PLT 493 (H) 11/03/2021 0232   PLT 392 08/17/2011 1815   MCV 91.4 11/03/2021 0232   MCV 100 08/17/2011 1815   MCH 30.1 11/03/2021 0232   MCHC 32.9 11/03/2021 0232   RDW 15.2 11/03/2021 0232   RDW 14.7 (H) 08/17/2011 1815   LYMPHSABS 2.5 10/28/2021 1140   LYMPHSABS 2.7 08/17/2011 1815   MONOABS 0.7 10/28/2021 1140   MONOABS 0.4 08/17/2011 1815   EOSABS 0.1 10/28/2021 1140   EOSABS 0.0  08/17/2011 1815   BASOSABS 0.0 10/28/2021 1140   BASOSABS 0.0 08/17/2011 1815   CMP    Component Value Date/Time   NA 137 11/03/2021 0232   NA 139 08/17/2011 1815   K 4.6 11/03/2021 0232   K 3.6 08/17/2011 1815   CL 106 11/03/2021 0232   CL 105 08/17/2011 1815   CO2 21 (L) 11/03/2021 0232   CO2 27 08/17/2011 1815   GLUCOSE 263 (H) 11/03/2021 0232   GLUCOSE 61 (L) 08/17/2011 1815   BUN 15 11/03/2021 0232   BUN 18 08/17/2011 1815   CREATININE 0.77 11/03/2021 0232   CREATININE 1.17 08/17/2011 1815   CALCIUM 9.0 11/03/2021 0232   CALCIUM 9.7 08/17/2011 1815   PROT 8.2 (H) 10/28/2021 1140   PROT 9.8 (H) 04/04/2011 1527   ALBUMIN 3.1 (L) 10/28/2021 1140   ALBUMIN 4.5 04/04/2011 1527   AST 45 (H) 10/28/2021 1140   AST 35 04/04/2011 1527   ALT 50 (H) 10/28/2021 1140   ALT 33 04/04/2011 1527   ALKPHOS 190 (H) 10/28/2021 1140   ALKPHOS 97 04/04/2011 1527   BILITOT 0.5 10/28/2021 1140   BILITOT 0.6 04/04/2011 1527   GFRNONAA >60 11/03/2021 0232   GFRNONAA 54 (L) 08/17/2011 1815   GFRAA >60 11/02/2019 1752   GFRAA >60 08/17/2011 1815   COAGS Lab Results  Component Value Date   INR 1.0 10/28/2021   INR 1.3 (H) 08/13/2021   INR 1.0 07/26/2021   Lipid Panel    Component Value Date/Time   CHOL 99 10/29/2021 0048   TRIG 40 10/29/2021 0048   HDL 27 (L) 10/29/2021 0048   CHOLHDL 3.7 10/29/2021 0048   VLDL 8 10/29/2021 0048   LDLCALC 64 10/29/2021 0048   HgbA1C  Lab Results  Component Value Date   HGBA1C 13.2 (H) 10/30/2021   Urinalysis    Component Value Date/Time   COLORURINE STRAW (A) 10/28/2021 1355   APPEARANCEUR CLEAR 10/28/2021 1355   APPEARANCEUR Hazy 04/04/2011 1935   LABSPEC 1.022 10/28/2021 1355   LABSPEC >1.060 04/04/2011 1935   PHURINE 5.0 10/28/2021 1355   GLUCOSEU >=500 (A) 10/28/2021 1355   GLUCOSEU 50 mg/dL 04/04/2011 1935   HGBUR NEGATIVE 10/28/2021 [place] 10/28/2021 1355   BILIRUBINUR Negative 04/04/2011 1935   KETONESUR  NEGATIVE 10/28/2021 1355   PROTEINUR NEGATIVE 10/28/2021 1355   NITRITE NEGATIVE 10/28/2021 1355   LEUKOCYTESUR NEGATIVE 10/28/2021 1355   LEUKOCYTESUR Negative 04/04/2011 1935   Urine Drug Screen  Component Value Date/Time   LABOPIA NONE DETECTED 10/29/2021 1744   COCAINSCRNUR NONE DETECTED 10/29/2021 1744   COCAINSCRNUR NONE DETECTED 08/13/2021 1717   LABBENZ NONE DETECTED 10/29/2021 1744   AMPHETMU NONE DETECTED 10/29/2021 1744   THCU NONE DETECTED 10/29/2021 1744   LABBARB NONE DETECTED 10/29/2021 1744    Alcohol Level    Component Value Date/Time   ETH <10 10/28/2021 1140     SIGNIFICANT DIAGNOSTIC STUDIES Overnight EEG with video  Result Date: 10/30/2021 Lora Havens, MD     10/30/2021 10:21 AM Patient Name: Brittany [name] MRN: [id number] Epilepsy Attending: Lora Havens Referring Physician/Provider: Rosalin Hawking, MD Duration: 10/29/2021 1456 to 10/30/2021 1012 Patient history: 61 y.o. 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 EEG to evaluate for seizure Level of alertness: Awake, asleep AEDs during EEG study: Ativan, LCM Technical aspects: This EEG study was done with scalp electrodes positioned according to the 10-20 International system of electrode placement. Electrical activity was reviewed with band pass filter of 1-'70Hz'$ , sensitivity of 7 uV/mm, display speed of 68m/sec with a '60Hz'$  notched filter applied as appropriate. EEG data were recorded continuously and digitally stored.  Video monitoring was available and reviewed as appropriate. Description: The posterior dominant rhythm consists of 9 Hz activity of moderate voltage (25-35 uV) seen predominantly in posterior head regions, asymmetric ( left<right) and reactive to eye opening and eye closing. Sleep was characterized by vertex waves, sleep spindles (12 to 14 Hz), maximal  frontocentral region. EEG also showed intermittent 3 to 5 Hz theta -delta slowing. Spikes were noted in left temporal region  Hyperventilation and photic stimulation were not performed.   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 There is also cortical dysfunction arising from left temporal region likely secondary to underlying structural abnormality. No seizures were seen throughout the recording. PLora Havens  ECHOCARDIOGRAM COMPLETE  Result Date: 10/29/2021    ECHOCARDIOGRAM REPORT   Patient Name:   Brittany GADEDate of Exam: 10/29/2021 Medical Rec #:  [id number]    Height:       60.0 in Accession #:    [id number]   Weight:       123.9 lb Date of Birth:  [birth year]   BSA:          1.523 m Patient Age:    89years      BP:           130/95 mmHg Patient Gender: F             HR:           86 bpm. Exam Location:  Inpatient Procedure: 2D Echo, Color Doppler and Cardiac Doppler Indications:    Stroke  History:        Patient has prior history of Echocardiogram examinations, most                 recent 11/06/2020. Risk Factors:Hypertension and Diabetes. Lupus.  Sonographer:    AClayton LefortRDCS (AE) Referring Phys: AMinnetrista 1. Left ventricular ejection fraction, by estimation, is 55 to 60%. The left ventricle has normal function. The left ventricle has no regional wall motion abnormalities. There is mild asymmetric left ventricular hypertrophy of  the basal segment. Left ventricular diastolic parameters are consistent with Grade I diastolic dysfunction (impaired relaxation).  2. Right ventricular systolic function is low normal. The right ventricular size is normal. Tricuspid regurgitation signal is inadequate for assessing PA pressure.  3. The mitral valve is abnormal. Trivial mitral valve regurgitation. Moderate  mitral annular calcification.  4. The aortic valve is tricuspid. Aortic valve regurgitation is not visualized.  5. The inferior vena cava is normal in size with greater than 50% respiratory variability, suggesting right atrial pressure of 3 mmHg. Comparison(s): Prior images reviewed side by side. LVEF remains normal at 55-60%. FINDINGS  Left Ventricle: Left ventricular ejection fraction, by estimation, is 55 to 60%. The left ventricle has normal function. The left ventricle has no regional wall motion abnormalities. The left ventricular internal cavity size was normal in size. There is  mild asymmetric left ventricular hypertrophy of the basal segment. Left ventricular diastolic parameters are consistent with Grade I diastolic dysfunction (impaired relaxation). Right Ventricle: The right ventricular size is normal. No increase in right ventricular wall thickness. Right ventricular systolic function is low normal. Tricuspid regurgitation signal is inadequate for assessing PA pressure. Left Atrium: Left atrial size was normal in size. Right Atrium: Right atrial size was normal in size. Pericardium: There is no evidence of pericardial effusion. Mitral Valve: The mitral valve is abnormal. There is mild thickening of the mitral valve leaflet(s). There is mild calcification of the mitral valve leaflet(s). Moderate mitral annular calcification. Trivial mitral valve regurgitation. Tricuspid Valve: The tricuspid valve is grossly normal. Tricuspid valve regurgitation is trivial. Aortic Valve: The aortic valve is tricuspid. There is mild aortic valve annular calcification. Aortic valve regurgitation is not visualized. Aortic valve mean gradient measures 1.0 mmHg. Aortic valve peak gradient measures 2.5 mmHg. Aortic valve area, by  VTI measures 4.08 cm. Pulmonic Valve: The pulmonic valve was grossly normal. Pulmonic valve regurgitation is trivial. Aorta: The aortic root is normal in size and structure. Venous: The inferior  vena cava is normal in size with greater than 50% respiratory variability, suggesting right atrial pressure of 3 mmHg. IAS/Shunts: No atrial level shunt detected by color flow Doppler.  LEFT VENTRICLE PLAX 2D LVIDd:         3.20 cm   Diastology LVIDs:         2.20 cm   LV e' medial:    6.09 cm/s LV PW:         1.10 cm   LV E/e' medial:  13.7 LV IVS:        1.20 cm   LV e' lateral:   5.98 cm/s LVOT diam:     2.30 cm   LV E/e' lateral: 14.0 LV SV:         47 LV SV Index:   31 LVOT Area:     4.15 cm  RIGHT VENTRICLE RV Basal diam:  2.40 cm RV S prime:     6.85 cm/s TAPSE (M-mode): 1.6 cm LEFT ATRIUM             Index        RIGHT ATRIUM          Index LA diam:        2.20 cm 1.44 cm/m   RA Area:     7.79 cm LA Vol (A2C):   12.7 ml 8.34 ml/m   RA Volume:   13.50 ml 8.86 ml/m LA Vol (A4C):   25.1 ml 16.48 ml/m LA Biplane Vol: 19.3 ml  12.67 ml/m  AORTIC VALVE AV Area (Vmax):    3.29 cm AV Area (Vmean):   3.18 cm AV Area (VTI):     4.08 cm AV Vmax:           78.40 cm/s AV Vmean:          53.100 cm/s AV VTI:            0.115 m AV Peak Grad:      2.5 mmHg AV Mean Grad:      1.0 mmHg LVOT Vmax:         62.00 cm/s LVOT Vmean:        40.600 cm/s LVOT VTI:          0.113 m LVOT/AV VTI ratio: 0.98  AORTA Ao Root diam: 3.30 cm Ao Asc diam:  3.20 cm MITRAL VALVE MV Area (PHT): 4.96 cm     SHUNTS MV Decel Time: 153 msec     Systemic VTI:  0.11 m MV E velocity: 83.70 cm/s   Systemic Diam: 2.30 cm MV A velocity: 119.00 cm/s MV E/A ratio:  0.70 Rozann Lesches MD Electronically signed by Rozann Lesches MD Signature Date/Time: 10/29/2021/3:08:29 PM    Final    MR BRAIN WO CONTRAST  Result Date: 10/29/2021 CLINICAL DATA:  Acute neuro deficit. EXAM: MRI HEAD WITHOUT CONTRAST TECHNIQUE: Multiplanar, multiecho pulse sequences of the brain and surrounding structures were obtained without intravenous contrast. COMPARISON:  CT head 10/28/2021 FINDINGS: Brain: Limited MRI. The patient was not able to hold still and was scanned in  the lateral decubitus position. After several sequences, the patient left the scanner and the study was terminated. Diffusion-weighted imaging is diagnostic with mild motion. No acute infarct identified Chronic infarct left cerebellum and left occipital lobe as noted on prior studies. Chronic microvascular ischemic change also noted in the white matter. No hydrocephalus. IMPRESSION: Limited and incomplete study.  No acute infarct. Electronically Signed   By: Franchot Gallo M.D.   On: 10/29/2021 11:11   EEG adult  Result Date: 10/28/2021 Greta Doom, MD     10/28/2021  5:54 PM History: Concern for seizure Sedation: none Technique: This EEG was acquired with electrodes placed according to the International 10-20 electrode system (including Fp1, Fp2, F3, F4, C3, C4, P3, P4, O1, O2, T3, T4, T5, T6, A1, A2, Fz, Cz, Pz). The following electrodes were missing or displaced: none. Background: The background consists of intermixed alpha and beta activities. There is a well defined posterior dominant rhythm of 9 Hz that is better seen on the right than left. Sleep is not recorded. Photic stimulation: Physiologic driving is not performed EEG Abnormalities: 1) Assymetric PDR Clinical Interpretation: This EEG recorded evidence of a left posterior cortical dysfunction consistent with the patient's know parieto-occipital infarct. There was no seizure or seizure predisposition recorded on this study. Please note that lack of epileptiform activity on EEG does not preclude the possibility of epilepsy. Roland Rack, MD [facility] [phone] If 7pm- 7am, please page neurology on call as listed in [place].   DG Chest Portable 1 View  Result Date: 10/28/2021 CLINICAL DATA:  Altered level of consciousness EXAM: PORTABLE CHEST 1 VIEW COMPARISON:  09/21/2021 FINDINGS: Single frontal view of the chest demonstrates a stable cardiac silhouette. Chronic bibasilar interstitial prominence and linear areas of  consolidation, right greater than left. No new airspace disease, effusion, or pneumothorax. No acute bony abnormalities. IMPRESSION: 1. Chronic bibasilar interstitial prominence with scattered bibasilar consolidation, which has been stable  since the 06/25/2021 exam. Findings are likely sequela of postinflammatory scarring or chronic aspiration pneumonia. 2. Otherwise unremarkable chest x-ray. Electronically Signed   By: Randa Ngo M.D.   On: 10/28/2021 15:02   CT ANGIO HEAD NECK W WO CM W PERF (CODE STROKE)  Result Date: 10/28/2021 CLINICAL DATA:  61 year old female code stroke presentation. Chronic infarcts. EXAM: CT ANGIOGRAPHY HEAD AND NECK TECHNIQUE: Multidetector CT imaging of the head and neck was performed using the standard protocol during bolus administration of intravenous contrast. Multiplanar CT image reconstructions and MIPs were obtained to evaluate the vascular anatomy. Carotid stenosis measurements (when applicable) are obtained utilizing NASCET criteria, using the distal internal carotid diameter as the denominator. CT Cerebral Perfusion also performed, with planned analysis by RAPID software, but the study is nondiagnostic due to patient motion. RADIATION DOSE REDUCTION: This exam was performed according to the departmental dose-optimization program which includes automated exposure control, adjustment of the mA and/or kV according to patient size and/or use of iterative reconstruction technique. CONTRAST:  110m OMNIPAQUE IOHEXOL 350 MG/ML SOLN COMPARISON:  Plain head CT 1148 hours today. FINDINGS: CT Perfusion: Nondiagnostic due to pronounced patient motion. CTA NECK Skeleton: Cervical spine degeneration. No acute osseous abnormality identified. Upper chest: Centrilobular emphysema. Mild upper lung atelectasis. No superior mediastinal lymphadenopathy. Other neck: No neck mass or lymphadenopathy identified. Aortic arch: Calcified aortic atherosclerosis. Aberrant origin of the right  subclavian artery, 4 vessel arch configuration. Right carotid system: Mildly tortuous proximal right CCA. Right CCA and right ICA intermittent plaque with no significant stenosis to the skull base. Left carotid system: Mildly tortuous proximal left CCA. Less pronounced left carotid plaque in the neck with no stenosis to the skull base. Vertebral arteries: Aberrant origin proximal right subclavian artery with atherosclerosis but no significant stenosis. Right vertebral artery origin is normal. Right vertebral artery is non dominant and diminutive but patent to the skull base with no focal stenosis identified. Proximal left subclavian artery soft and calcified plaque without significant stenosis. Calcified plaque at the left vertebral artery origin but no significant stenosis. Tortuous left V1 segment. Dominant left vertebral artery is patent to the skull base with only mild stenosis. CTA HEAD Posterior circulation: Dominant left V4 segment. Patent PICA origins and no distal vertebral or vertebrobasilar junction stenosis. Patent basilar artery without stenosis. Patent SCA and PCA origins. Right posterior communicating artery is present. Smaller left posterior communicating artery is present. There is up to moderate irregularity of the left PCA with attenuated distal PCA branches, chronic infarct in that vascular territory. Right PCA branches are patent with only mild irregularity. Anterior circulation: Both ICA siphons are patent. Mild siphon plaque, no siphon stenosis. Normal posterior communicating artery origins. Patent carotid termini, MCA and ACA origins. Tortuous A1 segments. Anterior communicating artery and bilateral ACA branches are within normal limits. Right MCA M1 segment and bifurcation are patent with only mild tortuosity and irregularity. No right MCA branch occlusion. Left MCA M1 segment and bifurcation are patent with only mild irregularity. No left MCA branch occlusion is identified. Venous sinuses:  Early contrast timing, not well evaluated. Anatomic variants: Dominant left vertebral artery. Aberrant origin of the right subclavian artery. Fetal type right PCA origin. Review of the MIP images confirms the above findings IMPRESSION: 1. Negative for emergent large vessel occlusion. Nondiagnostic CT Perfusion due to patient motion. 2. Irregular and attenuated Left PCA branches compatible with chronic infarct in that territory. 3. Otherwise generally mild for age atherosclerosis in the head and neck. No  other significant arterial stenosis identified. 4. More advanced Aortic Atherosclerosis (ICD10-I70.0). 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 5. Emphysema (ICD10-J43.9). Preliminary CTA results were discussed by telephone with Neurology Dr. Quinn Axe on 10/28/2021 at 1214 hours. Electronically Signed   By: Genevie Ann M.D.   On: 10/28/2021 12:35   CT HEAD CODE STROKE WO CONTRAST  Result Date: 10/28/2021 CLINICAL DATA:  Code stroke. 61 year old female with history of multiple prior infarcts. New aphasia. Possible seizure activity. EXAM: CT HEAD WITHOUT CONTRAST TECHNIQUE: Contiguous axial images were obtained from the base of the skull through the vertex without intravenous contrast. RADIATION DOSE REDUCTION: This exam was performed according to the departmental dose-optimization program which includes automated exposure control, adjustment of the mA and/or kV according to patient size and/or use of iterative reconstruction technique. COMPARISON:  Head CT 09/21/2021. FINDINGS: Brain: Multifocal chronic infarcts and encephalomalacia appears stable by CT since 09/21/2021. This includes involvement in the anterior right MCA territory, extensive left temporal and occipital lobe encephalomalacia, extensive left cerebellar encephalomalacia. Additional confluent bilateral cerebral white matter hypodensity including deep white matter capsule involvement. No acute intracranial hemorrhage identified. No midline  shift, mass effect, or evidence of intracranial mass lesion. No ventriculomegaly. No cortically based acute infarct identified. Vascular: Calcified atherosclerosis at the skull base. No suspicious intracranial vascular hyperdensity. Skull: No acute osseous abnormality identified. Sinuses/Orbits: Visualized paranasal sinuses and mastoids are stable and well aerated. Other: No acute orbit or scalp soft tissue finding. ASPECTS [facility] Stroke Program Early CT Score) Total score (0-10 with 10 being normal): 10, chronic encephalomalacia. IMPRESSION: 1. Stable CT appearance of advanced ischemic disease since last month. 2. No acute cortically based infarct or acute intracranial hemorrhage identified. ASPECTS 10. 3. Study discussed by telephone with Dr. Quinn Axe on 10/28/2021 at 1155 hours. Electronically Signed   By: Genevie Ann M.D.   On: 10/28/2021 11:58      HISTORY OF PRESENT ILLNESS Brittany [name] is a 61 y.o. 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  TNK given. EEG showed left temporal spikes consistent with her history. Adjusted seizure medications including depakote and vimpat with improvement in mentation. She does still have some perseveration. Recommend follow up with outpatient neurologist for management of medications.   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 No seizures were seen throughout the recording. 2D Echo EF 55 to 60% LDL 64 HgbA1c 13.2 UDS negative SCDs for VTE prophylaxis aspirin 81 mg daily prior to admission, now on aspirin 81 mg daily.  Increased Vimpat to 100 milligrams twice  daily Ongoing aggressive stroke risk factor management Therapy recommendations: SNF back to [facility] Disposition: Pending   History of seizure 03/2021 admitted for confusion, possible seizure and possible COVID.  EEG showed LPD with brief ictal discharge.  Long-term EEG showed left temporal epileptogenicity, discharged on Keppra 500 Per pharmacy, patient did not refill Keppra   History of stroke 07/2019 admitted for nausea vomiting, headache and generalized weakness.  Found to have DKA.  MRI showed left inferior cerebellum infarct.  LDL 153, A1c 13.5.  Carotid Doppler negative.  EF 55 to 60%. 10/2020 admitted for altered mental status, slurred speech, dysphagia  and aphasia with right-sided mild weakness.  CT showed left PCA infarct.  MRI showed early subacute left PCA infarct, old bilateral BG lacunar infarct, left cerebellum old infarct.  Carotid Doppler negative, MRA negative.  EF 65 to 70%.  Discharged on DAPT.   Agitation and combative ? 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 Continue Seroquel Back on restraints today  Psychiatry recs- depakote and zyprexa in addition to seroquel On depakote 250 bid plan to increase to 500 mg twice daily Zyprexa PRN   Diabetes, uncontrolled HgbA1c 13.2 goal < 7.0 Hyperglycemia on presentation, glucose 450 Uncontrolled Currently on Levemir BID 6 am/11hs CBG monitoring SSI Q4h now SSI with 6u Novolog with meals added DM education and close PCP follow up   Hypertension Home meds Cardizem 120 Stable Long term BP goal normotensive   Hyperlipidemia Home meds: Lipitor 40 LDL 64, goal < 70 Now on Lipitor 40 Continue statin at discharge   Tobacco abuse Current smoker Smoking cessation counseling will be provided Pt is willing to quit     Other Active Problems Lower back pain 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 rate 16, weight 53.1 kg, SpO2 92 %.  General - Well nourished, well developed, calm and cooperative with exam today    Neuro - awake, alert, eyes open, perseverates on food but is able to answer some questions today.  Oriented to self and place, able to follow simple and two step commands. No gaze palsy, tracking bilaterally. No facial droop. Moving all extremities strong equally, Sensation intact to light touch, FNF intact bilaterally.  Discharge Diet       Diet   Diet Carb Modified Fluid consistency: Thin; Room service appropriate? Yes with Assist   liquids  DISCHARGE PLAN Disposition:  [facility] Continue ASA '81mg'$  as prescribed for secondary stroke prevention.  Ongoing stroke risk factor control by Primary Care Physician at time of discharge Follow-up PCP [name], NP in 2 weeks. Follow-up in [facility] in 8 weeks, office to schedule an appointment.   34 minutes were spent preparing discharge.  Patient seen and examined by NP/APP with MD. MD to update note as needed.   Janine Ores, DNP, FNP-BC [facility] Pager: [phone]  I have personally obtained history,examined this patient, reviewed notes, independently viewed imaging studies, participated in medical decision making and plan of care.ROS completed by me personally and pertinent positives fully documented  I have made any additions or clarifications directly to the above note. Agree with note above.    Antony Contras, MD Medical Director [facility] Pager: [phone] 11/09/2021 5:38 PM
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
## 2021-11-03 NOTE — Progress Notes (Addendum)
STROKE TEAM PROGRESS NOTE   SUBJECTIVE (INTERVAL HISTORY) [name].  She continues to perseverate on food.  She is doing well without wrist restraints and does not appear agitated.  She has been hemodynamically stable and her neurological exam is stable. Awaiting discharge back to [facility]:  [97.9 F (36.6 C)-99.3 F (37.4 C)] 99.3 F (37.4 C) (09/22 1547) Pulse Rate:  [69-106] 106 (09/22 1547) Resp:  [16-18] 17 (09/22 1547) BP: (109-137)/(79-109) 117/79 (09/22 1547) SpO2:  [93 %-99 %] 97 % (09/22 1547)  Recent Labs  Lab 11/03/21 0050 11/03/21 0504 11/03/21 0734 11/03/21 1116 11/03/21 1545  GLUCAP 340* 137* 134* 97 363*    Recent Labs  Lab 10/30/21 1436 10/31/21 0301 11/01/21 0949 11/02/21 0750 11/03/21 0232  NA 135 136 135 131* 137  K 4.5 3.9 4.2 5.1 4.6  CL 105 106 103 100 106  CO2 '23 23 22 '$ 20* 21*  GLUCOSE 201* 186* 377* 413* 263*  BUN '21 20 18 17 15  '$ CREATININE 0.86 0.60 0.82 0.73 0.77  CALCIUM 8.8* 9.2 9.3 8.8* 9.0    Recent Labs  Lab 10/28/21 1140  AST 45*  ALT 50*  ALKPHOS 190*  BILITOT 0.5  PROT 8.2*  ALBUMIN 3.1*    Recent Labs  Lab 10/28/21 1140 10/28/21 1145 10/30/21 1004 10/31/21 0301 11/01/21 0949 11/02/21 0750 11/03/21 0232  WBC 6.7  --  7.4 8.6 7.9 9.0 8.5  NEUTROABS 3.3  --   --   --   --   --   --   HGB 11.1*   < > 10.4* 10.4* 11.4* 10.9* 10.9*  HCT 35.4*   < > 31.1* 32.3* 34.7* 34.5* 33.1*  MCV 94.9  --  88.9 90.7 91.3 92.7 91.4  PLT 419*  --  477* 454* 497* 527* 493*   < > = values in this interval not displayed.    No results for input(s): "CKTOTAL", "CKMB", "CKMBINDEX", "TROPONINI" in the last 168 hours. No results for input(s): "LABPROT", "INR" in the last 72 hours.  No results for input(s): "COLORURINE", "LABSPEC", "PHURINE", "GLUCOSEU", "HGBUR", "BILIRUBINUR", "KETONESUR", "PROTEINUR", "UROBILINOGEN", "NITRITE", "LEUKOCYTESUR" in the last 72 hours.  Invalid input(s):  "APPERANCEUR"      Component Value Date/Time   CHOL 99 10/29/2021 0048   TRIG 40 10/29/2021 0048   HDL 27 (L) 10/29/2021 0048   CHOLHDL 3.7 10/29/2021 0048   VLDL 8 10/29/2021 0048   LDLCALC 64 10/29/2021 0048   Lab Results  Component Value Date   HGBA1C 13.2 (H) 10/30/2021      Component Value Date/Time   LABOPIA NONE DETECTED 10/29/2021 1744   COCAINSCRNUR NONE DETECTED 10/29/2021 1744   COCAINSCRNUR NONE DETECTED 08/13/2021 1717   LABBENZ NONE DETECTED 10/29/2021 1744   AMPHETMU NONE DETECTED 10/29/2021 1744   THCU NONE DETECTED 10/29/2021 1744   LABBARB NONE DETECTED 10/29/2021 1744    Recent Labs  Lab 10/28/21 1140  ETH <10     I have personally reviewed the radiological images below and agree with the radiology interpretations.  Overnight EEG with video  Result Date: 10/30/2021 Brittany Havens, MD     10/30/2021 10:21 AM Patient Name: Brittany [name] MRN: [id number] Epilepsy Attending: Lora [name] Referring Physician/Provider: Rosalin Hawking, MD Duration: 10/29/2021 1456 to 10/30/2021 1012 Patient history: 61 y.o. 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  EEG to evaluate for seizure Level of alertness: Awake, asleep AEDs during EEG study: Ativan, LCM Technical aspects: This EEG study was done with scalp electrodes positioned according to the 10-20 International system of electrode placement. Electrical activity was reviewed with band pass filter of 1-'70Hz'$ , sensitivity of 7 uV/mm, display speed of 76m/sec with a '60Hz'$  notched filter applied as appropriate. EEG data were recorded continuously and digitally stored.  Video monitoring was available and reviewed as appropriate. Description: The posterior dominant rhythm consists of 9 Hz activity of moderate voltage (25-35 uV) seen predominantly in posterior head regions, asymmetric (  left<right) and reactive to eye opening and eye closing. Sleep was characterized by vertex waves, sleep spindles (12 to 14 Hz), maximal frontocentral region. EEG also showed intermittent 3 to 5 Hz theta -delta slowing. Spikes were noted in left temporal region  Hyperventilation and photic stimulation were not performed.   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 There is also cortical dysfunction arising from left temporal region likely secondary to underlying structural abnormality. No seizures were seen throughout the recording. PLora [name]  ECHOCARDIOGRAM COMPLETE  Result Date: 10/29/2021    ECHOCARDIOGRAM REPORT   Patient Name:   Brittany ROADCAPDate of Exam: 10/29/2021 Medical Rec #:  [id number]    Height:       60.0 in Accession #:    [id number]   Weight:       123.9 lb Date of Birth:  110-14-1962   BSA:          1.523 m Patient Age:    89years      BP:           130/95 mmHg Patient Gender: F             HR:           86 bpm. Exam Location:  Inpatient Procedure: 2D Echo, Color Doppler and Cardiac Doppler Indications:    Stroke  History:        Patient has prior history of Echocardiogram examinations, most                 recent 11/06/2020. Risk Factors:Hypertension and Diabetes. Lupus.  Sonographer:    AClayton LefortRDCS (AE) Referring Phys: [place] 1. Left ventricular ejection fraction, by estimation, is 55 to 60%. The left ventricle has normal function. The left ventricle has no regional wall motion abnormalities. There is mild asymmetric left ventricular hypertrophy of the basal segment. Left ventricular diastolic parameters are consistent with Grade I diastolic dysfunction (impaired relaxation).  2. Right ventricular systolic function is low normal. The right ventricular size is normal. Tricuspid  regurgitation signal is inadequate for assessing PA pressure.  3. The mitral valve is abnormal. Trivial mitral valve regurgitation. Moderate mitral annular calcification.  4. The aortic valve is tricuspid. Aortic valve regurgitation is not visualized.  5. The inferior vena cava is normal in size with greater than 50% respiratory variability, suggesting right atrial pressure of 3 mmHg. Comparison(s): Prior images reviewed side by side. LVEF remains normal at 55-60%. FINDINGS  Left Ventricle: Left ventricular ejection fraction, by estimation, is 55 to 60%. The left ventricle has normal function. The left ventricle has no regional wall motion abnormalities. The left ventricular internal cavity size was normal in size. There is  mild asymmetric left ventricular hypertrophy of the basal segment. Left ventricular diastolic parameters are consistent with Grade I diastolic dysfunction (impaired relaxation). Right Ventricle: The right ventricular size is normal. No increase in right ventricular wall thickness. Right ventricular systolic function is low normal. Tricuspid regurgitation signal is inadequate for assessing PA pressure. Left Atrium: Left atrial size was normal in size. Right Atrium: Right atrial size was normal in size. Pericardium: There is no evidence of pericardial effusion. Mitral Valve: The mitral valve is abnormal. There is mild thickening of the mitral valve leaflet(s). There is mild calcification of the mitral valve leaflet(s). Moderate mitral annular calcification. Trivial mitral valve regurgitation. Tricuspid Valve: The tricuspid valve is grossly normal. Tricuspid valve regurgitation is trivial. Aortic Valve: The aortic valve is tricuspid. There is mild aortic valve annular calcification. Aortic valve regurgitation is not visualized. Aortic valve mean gradient measures 1.0 mmHg. Aortic valve peak gradient measures 2.5 mmHg. Aortic valve area, by  VTI measures 4.08 cm. Pulmonic Valve: The pulmonic valve  was grossly normal. Pulmonic valve regurgitation is trivial. Aorta: The aortic root is normal in size and structure. Venous: The inferior vena cava is normal in size with greater than 50% respiratory variability, suggesting right atrial pressure of 3 mmHg. IAS/Shunts: No atrial level shunt detected by color flow Doppler.  LEFT VENTRICLE PLAX 2D LVIDd:         3.20 cm   Diastology LVIDs:         2.20 cm   LV e' medial:    6.09 cm/s LV PW:         1.10 cm   LV E/e' medial:  13.7 LV IVS:        1.20 cm   LV e' lateral:   5.98 cm/s LVOT diam:     2.30 cm   LV E/e' lateral: 14.0 LV SV:         47 LV SV Index:   31 LVOT Area:     4.15 cm  RIGHT VENTRICLE RV Basal diam:  2.40 cm RV S prime:     6.85 cm/s TAPSE (M-mode): 1.6 cm LEFT ATRIUM             Index        RIGHT ATRIUM          Index LA diam:        2.20 cm 1.44 cm/m   RA Area:     7.79 cm LA Vol (A2C):   12.7 ml 8.34 ml/m   RA Volume:   13.50 ml 8.86 ml/m LA Vol (A4C):   25.1 ml 16.48 ml/m LA Biplane Vol: 19.3 ml 12.67 ml/m  AORTIC VALVE AV Area (Vmax):    3.29 cm AV Area (Vmean):   3.18 cm AV Area (VTI):     4.08 cm AV Vmax:           78.40 cm/s AV Vmean:          53.100 cm/s AV VTI:            0.115 m AV Peak Grad:      2.5 mmHg AV Mean Grad:      1.0 mmHg LVOT Vmax:         62.00 cm/s LVOT Vmean:        40.600 cm/s LVOT VTI:          0.113 m LVOT/AV VTI ratio: 0.98  AORTA Ao Root diam: 3.30 cm Ao Asc diam:  3.20 cm MITRAL VALVE  MV Area (PHT): 4.96 cm     SHUNTS MV Decel Time: 153 msec     Systemic VTI:  0.11 m MV E velocity: 83.70 cm/s   Systemic Diam: 2.30 cm MV A velocity: 119.00 cm/s MV E/A ratio:  0.70 Rozann Lesches MD Electronically signed by Rozann Lesches MD Signature Date/Time: 10/29/2021/3:08:29 PM    Final    MR BRAIN WO CONTRAST  Result Date: 10/29/2021 CLINICAL DATA:  Acute neuro deficit. EXAM: MRI HEAD WITHOUT CONTRAST TECHNIQUE: Multiplanar, multiecho pulse sequences of the brain and surrounding structures were obtained without  intravenous contrast. COMPARISON:  CT head 10/28/2021 FINDINGS: Brain: Limited MRI. The patient was not able to hold still and was scanned in the lateral decubitus position. After several sequences, the patient left the scanner and the study was terminated. Diffusion-weighted imaging is diagnostic with mild motion. No acute infarct identified Chronic infarct left cerebellum and left occipital lobe as noted on prior studies. Chronic microvascular ischemic change also noted in the white matter. No hydrocephalus. IMPRESSION: Limited and incomplete study.  No acute infarct. Electronically Signed   By: Franchot Gallo M.D.   On: 10/29/2021 11:11   EEG adult  Result Date: 10/28/2021 Greta Doom, MD     10/28/2021  5:54 PM History: Concern for seizure Sedation: none Technique: This EEG was acquired with electrodes placed according to the International 10-20 electrode system (including Fp1, Fp2, F3, F4, C3, C4, P3, P4, O1, O2, T3, T4, T5, T6, A1, A2, Fz, Cz, Pz). The following electrodes were missing or displaced: none. Background: The background consists of intermixed alpha and beta activities. There is a well defined posterior dominant rhythm of 9 Hz that is better seen on the right than left. Sleep is not recorded. Photic stimulation: Physiologic driving is not performed EEG Abnormalities: 1) Assymetric PDR Clinical Interpretation: This EEG recorded evidence of a left posterior cortical dysfunction consistent with the patient's know parieto-occipital infarct. There was no seizure or seizure predisposition recorded on this study. Please note that lack of epileptiform activity on EEG does not preclude the possibility of epilepsy. Roland Rack, MD [facility] [phone] If 7pm- 7am, please page neurology on call as listed in [facility].   DG Chest Portable 1 View  Result Date: 10/28/2021 CLINICAL DATA:  Altered level of consciousness EXAM: PORTABLE CHEST 1 VIEW COMPARISON:  09/21/2021  FINDINGS: Single frontal view of the chest demonstrates a stable cardiac silhouette. Chronic bibasilar interstitial prominence and linear areas of consolidation, right greater than left. No new airspace disease, effusion, or pneumothorax. No acute bony abnormalities. IMPRESSION: 1. Chronic bibasilar interstitial prominence with scattered bibasilar consolidation, which has been stable since the 06/25/2021 exam. Findings are likely sequela of postinflammatory scarring or chronic aspiration pneumonia. 2. Otherwise unremarkable chest x-ray. Electronically Signed   By: Randa Ngo M.D.   On: 10/28/2021 15:02   CT ANGIO HEAD NECK W WO CM W PERF (CODE STROKE)  Result Date: 10/28/2021 CLINICAL DATA:  61 year old female code stroke presentation. Chronic infarcts. EXAM: CT ANGIOGRAPHY HEAD AND NECK TECHNIQUE: Multidetector CT imaging of the head and neck was performed using the standard protocol during bolus administration of intravenous contrast. Multiplanar CT image reconstructions and MIPs were obtained to evaluate the vascular anatomy. Carotid stenosis measurements (when applicable) are obtained utilizing NASCET criteria, using the distal internal carotid diameter as the denominator. CT Cerebral Perfusion also performed, with planned analysis by RAPID software, but the study is nondiagnostic due to patient motion. RADIATION DOSE REDUCTION: This exam was  performed according to the departmental dose-optimization program which includes automated exposure control, adjustment of the mA and/or kV according to patient size and/or use of iterative reconstruction technique. CONTRAST:  129m OMNIPAQUE IOHEXOL 350 MG/ML SOLN COMPARISON:  Plain head CT 1148 hours today. FINDINGS: CT Perfusion: Nondiagnostic due to pronounced patient motion. CTA NECK Skeleton: Cervical spine degeneration. No acute osseous abnormality identified. Upper chest: Centrilobular emphysema. Mild upper lung atelectasis. No superior mediastinal  lymphadenopathy. Other neck: No neck mass or lymphadenopathy identified. Aortic arch: Calcified aortic atherosclerosis. Aberrant origin of the right subclavian artery, 4 vessel arch configuration. Right carotid system: Mildly tortuous proximal right CCA. Right CCA and right ICA intermittent plaque with no significant stenosis to the skull base. Left carotid system: Mildly tortuous proximal left CCA. Less pronounced left carotid plaque in the neck with no stenosis to the skull base. Vertebral arteries: Aberrant origin proximal right subclavian artery with atherosclerosis but no significant stenosis. Right vertebral artery origin is normal. Right vertebral artery is non dominant and diminutive but patent to the skull base with no focal stenosis identified. Proximal left subclavian artery soft and calcified plaque without significant stenosis. Calcified plaque at the left vertebral artery origin but no significant stenosis. Tortuous left V1 segment. Dominant left vertebral artery is patent to the skull base with only mild stenosis. CTA HEAD Posterior circulation: Dominant left V4 segment. Patent PICA origins and no distal vertebral or vertebrobasilar junction stenosis. Patent basilar artery without stenosis. Patent SCA and PCA origins. Right posterior communicating artery is present. Smaller left posterior communicating artery is present. There is up to moderate irregularity of the left PCA with attenuated distal PCA branches, chronic infarct in that vascular territory. Right PCA branches are patent with only mild irregularity. Anterior circulation: Both ICA siphons are patent. Mild siphon plaque, no siphon stenosis. Normal posterior communicating artery origins. Patent carotid termini, MCA and ACA origins. Tortuous A1 segments. Anterior communicating artery and bilateral ACA branches are within normal limits. Right MCA M1 segment and bifurcation are patent with only mild tortuosity and irregularity. No right MCA  branch occlusion. Left MCA M1 segment and bifurcation are patent with only mild irregularity. No left MCA branch occlusion is identified. Venous sinuses: Early contrast timing, not well evaluated. Anatomic variants: Dominant left vertebral artery. Aberrant origin of the right subclavian artery. Fetal type right PCA origin. Review of the MIP images confirms the above findings IMPRESSION: 1. Negative for emergent large vessel occlusion. Nondiagnostic CT Perfusion due to patient motion. 2. Irregular and attenuated Left PCA branches compatible with chronic infarct in that territory. 3. Otherwise generally mild for age atherosclerosis in the head and neck. No other significant arterial stenosis identified. 4. More advanced Aortic Atherosclerosis (ICD10-I70.0). 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 5. Emphysema (ICD10-J43.9). Preliminary CTA results were discussed by telephone with Neurology Dr. SQuinn Axeon 10/28/2021 at 1214 hours. Electronically Signed   By: HGenevie AnnM.D.   On: 10/28/2021 12:35   CT HEAD CODE STROKE WO CONTRAST  Result Date: 10/28/2021 CLINICAL DATA:  Code stroke. 61year old female with history of multiple prior infarcts. New aphasia. Possible seizure activity. EXAM: CT HEAD WITHOUT CONTRAST TECHNIQUE: Contiguous axial images were obtained from the base of the skull through the vertex without intravenous contrast. RADIATION DOSE REDUCTION: This exam was performed according to the departmental dose-optimization program which includes automated exposure control, adjustment of the mA and/or kV according to patient size and/or use of iterative reconstruction technique. COMPARISON:  Head CT 09/21/2021. FINDINGS: Brain:  Multifocal chronic infarcts and encephalomalacia appears stable by CT since 09/21/2021. This includes involvement in the anterior right MCA territory, extensive left temporal and occipital lobe encephalomalacia, extensive left cerebellar encephalomalacia. Additional  confluent bilateral cerebral white matter hypodensity including deep white matter capsule involvement. No acute intracranial hemorrhage identified. No midline shift, mass effect, or evidence of intracranial mass lesion. No ventriculomegaly. No cortically based acute infarct identified. Vascular: Calcified atherosclerosis at the skull base. No suspicious intracranial vascular hyperdensity. Skull: No acute osseous abnormality identified. Sinuses/Orbits: Visualized paranasal sinuses and mastoids are stable and well aerated. Other: No acute orbit or scalp soft tissue finding. ASPECTS [facility] Stroke Program Early CT Score) Total score (0-10 with 10 being normal): 10, chronic encephalomalacia. IMPRESSION: 1. Stable CT appearance of advanced ischemic disease since last month. 2. No acute cortically based infarct or acute intracranial hemorrhage identified. ASPECTS 10. 3. Study discussed by telephone with Dr. Quinn Axe on 10/28/2021 at 1155 hours. Electronically Signed   By: Genevie Ann M.D.   On: 10/28/2021 11:58     PHYSICAL EXAM  Temp:  [97.9 F (36.6 C)-99.3 F (37.4 C)] 99.3 F (37.4 C) (09/22 1547) Pulse Rate:  [69-106] 106 (09/22 1547) Resp:  [16-18] 17 (09/22 1547) BP: (109-137)/(79-109) 117/79 (09/22 1547) SpO2:  [93 %-99 %] 97 % (09/22 1547)  General - Well nourished, well developed, calm and cooperative with exam today    Neuro - awake, alert, eyes open, perseverates on food but is able to answer some questions today.  Oriented to self and place, able to follow simple and two step commands. No gaze palsy, tracking bilaterally. No facial droop. Moving all extremities strong equally, Sensation intact to light touch, FNF intact bilaterally.   ASSESSMENT/PLAN Brittany [name] is a 61 y.o. 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  TNK given.  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 No seizures were seen throughout the recording. 2D Echo EF 55 to 60% LDL 64 HgbA1c 13.2 UDS negative SCDs for VTE prophylaxis aspirin 81 mg daily prior to admission, now on aspirin 81 mg daily.  Increased Vimpat to 100 twice daily Ongoing aggressive stroke risk factor management Therapy recommendations: SNF back to [facility] Disposition: Pending  History of seizure 03/2021 admitted for confusion, possible seizure and possible COVID.  EEG showed LPD with brief ictal discharge.  Long-term EEG showed left temporal epileptogenicity, discharged on Keppra 500 Per pharmacy, patient did not refill Keppra  History of stroke 07/2019 admitted for nausea vomiting, headache and generalized weakness.  Found to have DKA.  MRI showed left inferior cerebellum infarct.  LDL 153, A1c 13.5.  Carotid Doppler negative.  EF 55 to 60%. 10/2020 admitted for altered mental status, slurred speech, dysphagia and aphasia with right-sided mild weakness.  CT showed left PCA infarct.  MRI showed early subacute left PCA infarct, old bilateral BG lacunar infarct, left cerebellum old infarct.  Carotid Doppler negative, MRA negative.  EF 65 to 70%.  Discharged on DAPT.  Agitation and combative ? 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 Continue Seroquel Off restrain today  Psychiatry recs- depakote and zyprexa in addition to seroquel On depakote 250 bid Zyprexa  PRN  Diabetes, uncontrolled HgbA1c 13.2 goal < 7.0 Hyperglycemia on presentation, glucose 450 Uncontrolled Currently on Levemir BID 6 am/11hs CBG monitoring SSI Q4h now SSI with 6u Novolog with meals added DM education and close PCP follow up  Hypertension Home  meds Cardizem 120 Stable Long term BP goal normotensive  Hyperlipidemia Home meds: Lipitor 40 LDL 64, goal < 70 Now on Lipitor 40 Continue statin at discharge  Tobacco abuse Current smoker Smoking cessation counseling will be provided Pt is willing to quit  Other Stroke Risk Factors   Other Active Problems Lower back pain Discoid lupus 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 MD to update note as needed.   Janine Ores, DNP, FNP-BC [facility] Pager: [phone]  ATTENDING NOTE: I reviewed above note and agree with the assessment and plan.   No acute event overnight, still off restrain and calm without agitation. Glucose better controlled but still fluctuate. Pending transfer back to [facility].   For detailed assessment and plan, please refer to above/below as I have made changes wherever appropriate.   Rosalin Hawking, MD PhD Stroke Neurology 11/03/2021 9:55 PM     To contact Stroke Continuity provider, please refer to [email or web address] After hours, contact General Neurology

## 2021-11-03 NOTE — Consult Note (Cosign Needed)
[place] [facility] Followup Face-to-Face Psychiatric Evaluation   Service Date: November 03, 2021 LOS:  LOS: 6 days    Assessment  Brittany [name] is a 61 y.o. female admitted medically for 10/28/2021 11:33 AM for acute aphasia; admitted for anticoagulation (TNK) due to suspected stroke. She carries the psychiatric diagnoses of depression and anxiety and has a complex past medical history of colostomy, COPD, DM, lupus, pancreatic cancer, RLS, seizure disorder. She has had several recent admissions for encephalopathy. Psychiatry was consulted for agitation by Janine Ores, NP.   When examined by psychiatry consult service today, pt was alert and oriented to self only (aware of name but not age nor birthday- near but not exactly). She demonstrated poor attention both throughout the exam and on formal testing. There was no agitation at time of interview again today; this is in stark contrast to information obtained from primary team and chart review. Overall most c/w delirium due to waxing and waning nature of symptoms.    At this time, the patient's presentation is most consistent with delirium, most likely due to multiple etiologies including but not limited to seizures, infection, medications, pain, altered sleep/wake cycle, and limited mobility. The patient would strongly benefit from medical treatment of seizure disorder. During this time period, minimization of delirogenic insults will be of utmost importance; this includes promoting the normal circadian cycle, minimizing lines/tubes, avoiding deliriogenic medications such as benzodiazepines and anticholinergic medications, and frequently reorienting the patient. Symptomatic treatment for agitation can be provided by antipsychotic medications, though it is important to remember that these do not treat the underlying etiology of delirium. Notably, there can be a time lag effect between treatment of a medical problem and resolution of delirium.  This time lag effect may be of longer duration in the elderly, and those with underlying cognitive impairment or brain injury. Would assume that [name] is at risk for longer durations of delirium given multiple recent episodes  Diagnoses:  Active Hospital problems: Principal Problem:   Acute ischemic stroke [facility]) Active Problems:   Seizure ([place])     Plan  ## Safety and Observation Level:  - Based on my clinical evaluation, I estimate the patient to be at moderate risk of self harm in the current setting due to decreased safety awareness; defer level of observation to primary team.   ## Medications:  -- CONTINUE olanzapine 2.5 mg TID PRN PO/IM backup for agitation -- CONTINUE divalproex 250 mg BID for agitation, adjunctive seizure control (in conjunction w/ neuro) --  Previously stopped lorazepam for agitation. Continue to AVOID all benzodiazepines in this patient; they do not aid in her agitation.    ## Medical Decision Making Capacity:  Not formally assessed. Will likely wax and wane with overall sensorium.   ## Further Work-up:  -- None currently   -- most recent EKG on 9/16 had QtC of 464 -- Pertinent labwork reviewed earlier this admission includes: hypoalbumineima on CMP  ## Disposition:  -- per primary. No psychiatric contraindication to dc when medically appropriate  ## Behavioral / Environmental:  -- Putting in delirium precautions    Thank you for this consult request. Recommendations have been communicated to the primary team.  We will sign off at this time.  Should further psychiatric concerns arise, please do not hesitate to reconsult.  [name], MD   New history  Relevant Aspects of Hospital Course:  Admitted on 10/28/2021 for stroke r/o; received anticoagulatoin. Hospitalization marked by encephalopathy and agitation, currently in  lap and wrist restraints. Has gotten multiple doses of lorazepam, seems to be oversedating based on chart review.    Patient Report:  Pt seen alone at bedside in the morning, but was sleeping comfortably and only briefly awoke to voice, so she was reassessed in the afternoon. She is oriented to self (name, birthday but not birth year nor age).  She offers a completely unrelated answer when asked about the current month and year "it's not all there" while pointing at her stomach.  She does not answer why she is in the hospital.  In comparison to previous day, she does not anticipate answers. She is intent on showing this writer her ostomy bag and needs to be redirected to interview multiple times.  ROS:  Devoid of SI, HI, AH/VH.   Collateral information:  No collateral obtained today.  Psychiatric History:  Information collected from pt chart Psych hx mostly c/w depression, anxiety, "anger" prior to string of hospitalizations for encephalopathy. Unclear if she has been hospitalized previously for psych reasons (not to my knowledge)  Previous Meds for depression: Celexa '10mg'$  (failed) Zoloft '25mg'$ , Wellbutrin XL '300mg'$ , Effexor XR '150mg'$  (failed), Prozac '20mg'$ Trazodone '25mg'$  QHS PRN  Family psych history: at least one sister with anxiety disorder   Social History:  From prior notes:   Smokes 1/1 ppd. No EtOH. No marijuana - some years and years ago. No cociane, heroin, meth.   Family History:  The patient's family history includes Anxiety disorder in her sister; Breast cancer in her maternal aunt.  Medical History: Past Medical History:  Diagnosis Date   C. difficile colitis 02/17/2021   Colostomy in place [facility])    h/o diverticulitis with abscess   Complicated grief 1/61/0960   COPD, mild ([place]) 09/16/2015   PFTs on 06/15/15 with FEV1/FVC ratio of 64%, FEV1 83%, DLCO 47%   Diabetes mellitus without complication (HCC)    Dyslipidemia    History of Clostridium difficile colitis 03/03/2021   Hyperosmolar hyperglycemic state (HHS) ([place]) 11/18/2020   Hypertension    Hypokalemia; hyperkalemia 07/12/2019    Lactic acidosis 11/19/2020   Lupus (HCC)    Pancreatic cancer (HCC)    RLS (restless legs syndrome)    Seizure disorder (HCC)    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[place]) 07/17/2019    Surgical History: Past Surgical History:  Procedure Laterality Date   COLECTOMY WITH COLOSTOMY CREATION/HARTMANN PROCEDURE N/A 03/10/2021   Procedure: COLECTOMY WITH COLOSTOMY CREATION/HARTMANN PROCEDURE;  Surgeon: Jules Husbands, MD;  Location: [facility] ORS;  Service: General;  Laterality: N/A;   PANCREATECTOMY     spleenectomy      Medications:   Current Facility-Administered Medications:    acetaminophen (TYLENOL) tablet 650 mg, 650 mg, Oral, Q4H PRN, 650 mg at 11/02/21 0934 **OR** acetaminophen (TYLENOL) 160 MG/5ML solution 650 mg, 650 mg, Per Tube, Q4H PRN **OR** acetaminophen (TYLENOL) suppository 650 mg, 650 mg, Rectal, Q4H PRN, Derek Jack, MD   aspirin EC tablet 81 mg, 81 mg, Oral, Daily, Rosalin Hawking, MD, 81 mg at 11/03/21 1047   atorvastatin (LIPITOR) tablet 40 mg, 40 mg, Oral, Nightly, Derek Jack, MD, 40 mg at 11/02/21 2137   Chlorhexidine Gluconate Cloth 2 % PADS 6 each, 6 each, Topical, Daily, Derek Jack, MD, 6 each at 11/03/21 1000   clevidipine (CLEVIPREX) infusion 0.5 mg/mL, 0-21 mg/hr, Intravenous, Continuous, Derek Jack, MD, Held at 10/28/21 1618   divalproex (DEPAKOTE) DR tablet 250 mg, 250 mg, Oral, BID, Dyrell Tuccillo, Joycelyn Schmid  A, 250 mg at 11/03/21 1047   famotidine (PEPCID) tablet 20 mg, 20 mg, Oral, Daily, Derek Jack, MD, 20 mg at 11/03/21 1047   hydrOXYzine (ATARAX) tablet 10 mg, 10 mg, Oral, TID PRN, Janine Ores, NP, 10 mg at 11/02/21 0935   insulin aspart (novoLOG) injection 0-15 Units, 0-15 Units, Subcutaneous, Q4H, Rosalin Hawking, MD, 15 Units at 11/03/21 1555   insulin aspart (novoLOG) injection 6 Units, 6 Units, Subcutaneous, TID WC, de Yolanda Manges, Cortney E, NP, 6 Units at 11/03/21 1555   insulin detemir (LEVEMIR) injection 11 Units, 11 Units,  Subcutaneous, QHS, Derek Jack, MD, 11 Units at 11/02/21 2138   insulin detemir (LEVEMIR) injection 6 Units, 6 Units, Subcutaneous, Daily, de Yolanda Manges, Milan E, NP, 6 Units at 11/03/21 1048   lacosamide (VIMPAT) tablet 100 mg, 100 mg, Oral, BID, Charlean Merl, [place], NP, 100 mg at 11/03/21 1047   mirtazapine (REMERON) tablet 15 mg, 15 mg, Oral, QHS, Derek Jack, MD, 15 mg at 11/02/21 2137   nicotine (NICODERM CQ - dosed in mg/24 hours) patch 14 mg, 14 mg, Transdermal, Daily, de Yolanda Manges, Cortney E, NP, 14 mg at 11/02/21 0935   OLANZapine (ZYPREXA) tablet 2.5 mg, 2.5 mg, Oral, TID PRN **OR** OLANZapine (ZYPREXA) injection 2.5 mg, 2.5 mg, Intramuscular, TID PRN, Madelena Maturin A   OLANZapine (ZYPREXA) injection 5 mg, 5 mg, Intramuscular, Once PRN, Janine Ores, NP   Oral care mouth rinse, 15 mL, Mouth Rinse, 4 times per day, Derek Jack, MD, 15 mL at 11/03/21 1556   Oral care mouth rinse, 15 mL, Mouth Rinse, PRN, Derek Jack, MD   QUEtiapine (SEROQUEL) tablet 25 mg, 25 mg, Oral, QHS, Derek Jack, MD, 25 mg at 11/02/21 2137   rOPINIRole (REQUIP) tablet 0.5 mg, 0.5 mg, Oral, QHS, Derek Jack, MD, 0.5 mg at 11/02/21 2137   senna-docusate (Senokot-S) tablet 1 tablet, 1 tablet, Oral, QHS PRN, Derek Jack, MD   zinc sulfate capsule 220 mg, 220 mg, Oral, Daily, Derek Jack, MD, 220 mg at 11/03/21 1047  Allergies: Allergies  Allergen Reactions   Cephalosporins Itching    TOLERATED ZOSYN (PIPERACILLIN) BEFORE   Hydromorphone Itching   Keflin [Cephalothin] Itching   Lactose Intolerance (Gi) Diarrhea       Objective  Vital signs:  Temp:  [97.9 F (36.6 C)-99.3 F (37.4 C)] 99.3 F (37.4 C) (09/22 1547) Pulse Rate:  [69-106] 106 (09/22 1547) Resp:  [16-18] 17 (09/22 1547) BP: (109-137)/(79-109) 117/79 (09/22 1547) SpO2:  [93 %-99 %] 97 % (09/22 1547)  Psychiatric Specialty Exam:  Presentation  General Appearance: Appropriate for Environment (In  hospital gown, no restraints)  Eye Contact:Fair  Speech:Clear and Coherent  Speech Volume:Normal  Handedness:No data recorded  Mood and Affect  Mood:Anxious; Euthymic  Affect:Congruent   Thought Process  Thought Processes:Irrevelant  Descriptions of Associations:Loose  Orientation:Partial  Thought Content:Scattered  History of Schizophrenia/Schizoaffective disorder:No data recorded Duration of Psychotic Symptoms:No data recorded Hallucinations:Hallucinations: -- (Patient responded with informing this writer about a portion of her colon being missing, stating, "It's not all there.")  Ideas of Reference:None  Suicidal Thoughts:Suicidal Thoughts: -- (Patient responded with informing this writer about a portion of her colon being missing, stating, "It's not all there.")  Homicidal Thoughts:Homicidal Thoughts: -- (Did not ask)   Sensorium  Memory:Immediate Poor; Recent Poor  Judgment:Impaired  Insight:None   Executive Functions  Concentration:Poor  Attention Span:Poor  [place] of Knowledge:Poor  Language:Poor  Psychomotor Activity  Psychomotor Activity:Psychomotor Activity: Normal   Assets  Assets:Financial Resources/Insurance; Resilience   Sleep  Sleep:Sleep: Good    Physical Exam: Physical Exam Vitals reviewed.  HENT:     Head: Normocephalic and atraumatic.     Mouth/Throat:     Mouth: Mucous membranes are moist.     Pharynx: Oropharynx is clear.  Pulmonary:     Effort: Pulmonary effort is normal.  Abdominal:     Comments: Ostomy bag in place   Neurological:     Mental Status: She is alert. She is disoriented.     Blood pressure 117/79, pulse (!) 106, temperature 99.3 F (37.4 C), temperature source Oral, resp. rate 17, weight 56.2 kg, SpO2 97 %. Body mass index is 24.2 kg/m.

## 2021-11-03 NOTE — TOC Progression Note (Signed)
Transition of Care [facility]) - Progression Note    Patient Details  Name: Brittany [name] MRN: [id number] Date of Birth: [birth year]  Transition of Care [facility]) CM/SW [place], [place] Phone Number: 11/03/2021, 4:03 PM  Clinical Narrative:   CSW left a message for Lajoyce Lauber to check on review of therapy notes, awaiting call back.     Expected Discharge Plan: Assisted Living Barriers to Discharge: Continued Medical Work up  Expected Discharge Plan and Services Expected Discharge Plan: Assisted Living       Living arrangements for the past 2 months: [place], Single Family Home Expected Discharge Date: 11/03/21                                     Social Determinants of Health (SDOH) Interventions    Readmission Risk Interventions    09/22/2021    1:24 PM 03/06/2021    9:13 AM 02/16/2021    2:45 PM  Readmission Risk Prevention Plan  Transportation Screening Complete Complete Complete  Medication Review Press photographer) Complete Complete Complete  PCP or Specialist appointment within 3-5 days of discharge Complete Complete   HRI or [place] Complete    SW Recovery Care/Counseling Consult Complete  Complete  Palliative Care Screening Not Applicable Not Applicable Not [place] Complete Not Applicable Not Applicable

## 2021-11-03 NOTE — TOC Progression Note (Signed)
Transition of Care [facility]) - Progression Note    Patient Details  Name: Brittany [name] MRN: [id number] Date of Birth: 1961-01-25  Transition of Care [facility]) CM/SW [place], [place] Phone Number: 11/03/2021, 4:04 PM  Clinical Narrative:   CSW contacted [place], spoke with Catina as Lajoyce Lauber is out of office today. Per Catina, she has not seen the therapy notes. CSW sent therapy notes again, asking for a call back after review. CSW waiting for call back, but noting improvements in patient's abilities today so patient should be able to return to ALF at discharge. CSW to follow.    Expected Discharge Plan: Assisted Living Barriers to Discharge: Continued Medical Work up  Expected Discharge Plan and Services Expected Discharge Plan: Assisted Living       Living arrangements for the past 2 months: [place], Single Family Home Expected Discharge Date: 11/03/21                                     Social Determinants of Health (SDOH) Interventions    Readmission Risk Interventions    09/22/2021    1:24 PM 03/06/2021    9:13 AM 02/16/2021    2:45 PM  Readmission Risk Prevention Plan  Transportation Screening Complete Complete Complete  Medication Review Press photographer) Complete Complete Complete  PCP or Specialist appointment within 3-5 days of discharge Complete Complete   HRI or [place] Complete    SW Recovery Care/Counseling Consult Complete  Complete  Palliative Care Screening Not Applicable Not Applicable Not [place] Complete Not Applicable Not Applicable

## 2021-11-03 NOTE — Plan of Care (Signed)
  Problem: Education: Goal: Ability to describe self-care measures that may prevent or decrease complications (Diabetes Survival Skills Education) will improve Outcome: Progressing Goal: Individualized Educational Video(s) Outcome: Progressing   Problem: Coping: Goal: Ability to adjust to condition or change in health will improve Outcome: Progressing   Problem: Fluid Volume: Goal: Ability to maintain a balanced intake and output will improve Outcome: Progressing   Problem: Health Behavior/Discharge Planning: Goal: Ability to identify and utilize available resources and services will improve Outcome: Progressing Goal: Ability to manage health-related needs will improve Outcome: Progressing   Problem: Metabolic: Goal: Ability to maintain appropriate glucose levels will improve Outcome: Progressing   Problem: Nutritional: Goal: Maintenance of adequate nutrition will improve Outcome: Progressing Goal: Progress toward achieving an optimal weight will improve Outcome: Progressing   Problem: Skin Integrity: Goal: Risk for impaired skin integrity will decrease Outcome: Progressing   Problem: Tissue Perfusion: Goal: Adequacy of tissue perfusion will improve Outcome: Progressing   Problem: Safety: Goal: Non-violent Restraint(s) Outcome: Progressing   Problem: Education: Goal: Knowledge of General Education information will improve Description: Including pain rating scale, medication(s)/side effects and non-pharmacologic comfort measures Outcome: Progressing   Problem: Health Behavior/Discharge Planning: Goal: Ability to manage health-related needs will improve Outcome: Progressing   Problem: Clinical Measurements: Goal: Ability to maintain clinical measurements within normal limits will improve Outcome: Progressing Goal: Will remain free from infection Outcome: Progressing Goal: Diagnostic test results will improve Outcome: Progressing Goal: Respiratory complications  will improve Outcome: Progressing Goal: Cardiovascular complication will be avoided Outcome: Progressing   Problem: Activity: Goal: Risk for activity intolerance will decrease Outcome: Progressing   Problem: Nutrition: Goal: Adequate nutrition will be maintained Outcome: Progressing   Problem: Coping: Goal: Level of anxiety will decrease Outcome: Progressing   Problem: Elimination: Goal: Will not experience complications related to bowel motility Outcome: Progressing Goal: Will not experience complications related to urinary retention Outcome: Progressing   Problem: Pain Managment: Goal: General experience of comfort will improve Outcome: Progressing   Problem: Safety: Goal: Ability to remain free from injury will improve Outcome: Progressing   Problem: Skin Integrity: Goal: Risk for impaired skin integrity will decrease Outcome: Progressing   Problem: Education: Goal: Knowledge of disease or condition will improve Outcome: Progressing Goal: Knowledge of secondary prevention will improve (SELECT ALL) Outcome: Progressing Goal: Knowledge of patient specific risk factors will improve (INDIVIDUALIZE FOR PATIENT) Outcome: Progressing Goal: Individualized Educational Video(s) Outcome: Progressing   Problem: Coping: Goal: Will verbalize positive feelings about self Outcome: Progressing Goal: Will identify appropriate support needs Outcome: Progressing   Problem: Health Behavior/Discharge Planning: Goal: Ability to manage health-related needs will improve Outcome: Progressing   Problem: Ischemic Stroke/TIA Tissue Perfusion: Goal: Complications of ischemic stroke/TIA will be minimized Outcome: Progressing

## 2021-11-03 NOTE — Progress Notes (Signed)
Mobility Specialist: Progress Note   11/03/21 1627  Mobility  Activity Ambulated with assistance in hallway  Level of Assistance Minimal assist, patient does 75% or more  Assistive Device Front wheel walker  Distance Ambulated (ft) 200 ft  Activity Response Tolerated fair  $Mobility charge 1 Mobility   Pt received sleeping in the bed, easily awakened and agreeable to mobility. Required minA with bed mobility as well as to stand. Pt with mostly flat affect during mobility today. Required verbal cues and physical assist for RW direction and management. Pt back to bed after session with call bell and phone at her side.   [facility] Brittany [name] Mobility Specialist Mobility Specialist [address]: [phone]

## 2021-11-03 NOTE — TOC Progression Note (Signed)
Transition of Care [facility]) - Progression Note    Patient Details  Name: TASHIA LEITERMAN MRN: [id number] Date of Birth: [birth year]  Transition of Care [facility]) CM/SW [place], [place] Phone Number: 11/03/2021, 4:05 PM  Clinical Narrative:   CSW sent discharge information to [facility], and called to ensure they received it. Spoke with Lajoyce Lauber, who said they would not be able to accept patient back without a new assessment and that couldn't happen until Monday. CSW contacted DSS to provide update on ALF refusing to readmit patient back today, left a voicemail. CSW also spoke with [facility] Supervisor, who contacted a DSS Representative to ask about patient returning to ALF, awaiting call back. CSW updated MD on barrier to discharge. CSW to follow.    Expected Discharge Plan: Assisted Living Barriers to Discharge: Other (must enter comment) (ALF refusing to take back without a new assessment)  Expected Discharge Plan and Services Expected Discharge Plan: Assisted Living       Living arrangements for the past 2 months: [place], Single Family Home Expected Discharge Date: 11/03/21                                     Social Determinants of Health (SDOH) Interventions    Readmission Risk Interventions    09/22/2021    1:24 PM 03/06/2021    9:13 AM 02/16/2021    2:45 PM  Readmission Risk Prevention Plan  Transportation Screening Complete Complete Complete  Medication Review Press photographer) Complete Complete Complete  PCP or Specialist appointment within 3-5 days of discharge Complete Complete   HRI or [place] Complete    SW Recovery Care/Counseling Consult Complete  Complete  Palliative Care Screening Not Applicable Not Applicable Not [place] Complete Not Applicable Not Applicable

## 2021-11-03 NOTE — Discharge Summary (Deleted)
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 Swallow whole.   atorvastatin 40 MG tablet Commonly known as: LIPITOR Take 1 tablet (40 mg total) by mouth Nightly. What changed: when to take this   diltiazem 120 MG 24 hr capsule Commonly known as: CARDIZEM CD Take 1 capsule (120 mg total) by mouth daily.   divalproex 250 MG DR tablet Commonly known as: DEPAKOTE Take 1 tablet (250 mg total) by mouth 2 (two) times daily.   famotidine 20 MG tablet Commonly known as: PEPCID Take 1 tablet (20 mg total) by mouth daily.   feeding supplement (GLUCERNA SHAKE) Liqd Take 237 mLs by mouth 3 (three) times daily between meals.   hydrOXYzine 10 MG tablet Commonly known as: ATARAX Take 1 tablet (10 mg total) by mouth 3 (three) times daily as needed for itching.   insulin aspart 100 UNIT/ML injection Commonly known as: novoLOG Inject 5 Units into the skin 3 (three) times daily with meals. If eats more than 5)% of meals What changed: additional instructions   insulin detemir 100 UNIT/ML injection Commonly known as: LEVEMIR Inject 0.08 mLs (8 Units total) into the skin 2 (two) times daily. What changed:  how much to take when to  take this   insulin lispro 100 UNIT/ML injection Commonly known as: HumaLOG 0-9 units TID with meals, and 0-5 units QHS bases on sliding scale previously prescribed to patient.   Lacosamide 100 MG Tabs Take 1 tablet (100 mg total) by mouth 2 (two) times daily.   melatonin 5 MG Tabs Take 0.5 tablets (2.5 mg total) by mouth at bedtime.   mirtazapine 15 MG tablet Commonly known as: REMERON Take 15 mg by mouth at bedtime.   multivitamin with minerals Tabs tablet Take 1 tablet by mouth daily.   nicotine 21 mg/24hr patch Commonly known as: NICODERM CQ - dosed in mg/24 hours Place 1 patch (21 mg total) onto the skin daily as needed (nicotine craving).   QUEtiapine 25 MG tablet Commonly known as: SEROQUEL Take 1 tablet (25 mg total) by mouth at bedtime.   rOPINIRole 0.5 MG tablet Commonly known as: REQUIP Take 0.5 mg by mouth at bedtime.   zinc sulfate 220 (50 Zn) MG capsule Take 1 capsule (220 mg total) by mouth daily.        LABORATORY STUDIES CBC    Component Value Date/Time   WBC 8.5 11/03/2021 0232   RBC 3.62 (L) 11/03/2021 0232   HGB 10.9 (L) 11/03/2021 0232   HGB 13.9 08/17/2011 1815   HCT 33.1 (L) 11/03/2021 0232   HCT 42.1 08/17/2011 1815   PLT 493 (  H) 11/03/2021 0232   PLT 392 08/17/2011 1815   MCV 91.4 11/03/2021 0232   MCV 100 08/17/2011 1815   MCH 30.1 11/03/2021 0232   MCHC 32.9 11/03/2021 0232   RDW 15.2 11/03/2021 0232   RDW 14.7 (H) 08/17/2011 1815   LYMPHSABS 2.5 10/28/2021 1140   LYMPHSABS 2.7 08/17/2011 1815   MONOABS 0.7 10/28/2021 1140   MONOABS 0.4 08/17/2011 1815   EOSABS 0.1 10/28/2021 1140   EOSABS 0.0 08/17/2011 1815   BASOSABS 0.0 10/28/2021 1140   BASOSABS 0.0 08/17/2011 1815   CMP    Component Value Date/Time   NA 137 11/03/2021 0232   NA 139 08/17/2011 1815   K 4.6 11/03/2021 0232   K 3.6 08/17/2011 1815   CL 106 11/03/2021 0232   CL 105 08/17/2011 1815   CO2 21 (L) 11/03/2021 0232   CO2 27 08/17/2011 1815   GLUCOSE  263 (H) 11/03/2021 0232   GLUCOSE 61 (L) 08/17/2011 1815   BUN 15 11/03/2021 0232   BUN 18 08/17/2011 1815   CREATININE 0.77 11/03/2021 0232   CREATININE 1.17 08/17/2011 1815   CALCIUM 9.0 11/03/2021 0232   CALCIUM 9.7 08/17/2011 1815   PROT 8.2 (H) 10/28/2021 1140   PROT 9.8 (H) 04/04/2011 1527   ALBUMIN 3.1 (L) 10/28/2021 1140   ALBUMIN 4.5 04/04/2011 1527   AST 45 (H) 10/28/2021 1140   AST 35 04/04/2011 1527   ALT 50 (H) 10/28/2021 1140   ALT 33 04/04/2011 1527   ALKPHOS 190 (H) 10/28/2021 1140   ALKPHOS 97 04/04/2011 1527   BILITOT 0.5 10/28/2021 1140   BILITOT 0.6 04/04/2011 1527   GFRNONAA >60 11/03/2021 0232   GFRNONAA 54 (L) 08/17/2011 1815   GFRAA >60 11/02/2019 1752   GFRAA >60 08/17/2011 1815   COAGS Lab Results  Component Value Date   INR 1.0 10/28/2021   INR 1.3 (H) 08/13/2021   INR 1.0 07/26/2021   Lipid Panel    Component Value Date/Time   CHOL 99 10/29/2021 0048   TRIG 40 10/29/2021 0048   HDL 27 (L) 10/29/2021 0048   CHOLHDL 3.7 10/29/2021 0048   VLDL 8 10/29/2021 0048   LDLCALC 64 10/29/2021 0048   HgbA1C  Lab Results  Component Value Date   HGBA1C 13.2 (H) 10/30/2021   Urinalysis    Component Value Date/Time   COLORURINE STRAW (A) 10/28/2021 1355   APPEARANCEUR CLEAR 10/28/2021 1355   APPEARANCEUR Hazy 04/04/2011 1935   LABSPEC 1.022 10/28/2021 1355   LABSPEC >1.060 04/04/2011 1935   PHURINE 5.0 10/28/2021 1355   GLUCOSEU >=500 (A) 10/28/2021 1355   GLUCOSEU 50 mg/dL 04/04/2011 1935   HGBUR NEGATIVE 10/28/2021 1355   BILIRUBINUR NEGATIVE 10/28/2021 1355   BILIRUBINUR Negative 04/04/2011 1935   KETONESUR NEGATIVE 10/28/2021 [place] 10/28/2021 1355   NITRITE NEGATIVE 10/28/2021 1355   LEUKOCYTESUR NEGATIVE 10/28/2021 1355   LEUKOCYTESUR Negative 04/04/2011 1935   Urine Drug Screen     Component Value Date/Time   LABOPIA NONE DETECTED 10/29/2021 1744   COCAINSCRNUR NONE DETECTED 10/29/2021 1744   COCAINSCRNUR  NONE DETECTED 08/13/2021 1717   LABBENZ NONE DETECTED 10/29/2021 1744   AMPHETMU NONE DETECTED 10/29/2021 1744   THCU NONE DETECTED 10/29/2021 1744   LABBARB NONE DETECTED 10/29/2021 1744    Alcohol Level    Component Value Date/Time   ETH <10 10/28/2021 1140     SIGNIFICANT DIAGNOSTIC STUDIES Overnight EEG with video  Result Date: 10/30/2021 Brittany Havens, MD  10/30/2021 10:21 AM Patient Name: Brittany [name] MRN: [id number] Epilepsy Attending: Lora [name] Referring [name]: Rosalin Hawking, MD Duration: 10/29/2021 1456 to 10/30/2021 1012 Patient history: 60 y.o. 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 EEG to evaluate for seizure Level of alertness: Awake, asleep AEDs during EEG study: Ativan, LCM Technical aspects: This EEG study was done with scalp electrodes positioned according to the 10-20 International system of electrode placement. Electrical activity was reviewed with band pass filter of 1-'70Hz'$ , sensitivity of 7 uV/mm, display speed of 28m/sec with a '60Hz'$  notched filter applied as appropriate. EEG data were recorded continuously and digitally stored.  Video monitoring was available and reviewed as appropriate. Description: The posterior dominant rhythm consists of 9 Hz activity of moderate voltage (25-35 uV) seen predominantly in posterior head regions, asymmetric ( left<right) and reactive to eye opening and eye closing. Sleep was characterized by vertex waves, sleep spindles (12 to 14 Hz), maximal frontocentral region. EEG also showed intermittent 3 to 5 Hz theta -delta slowing. Spikes were noted in left temporal region  Hyperventilation and photic stimulation were not performed.   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 There is also cortical dysfunction arising from left temporal region likely secondary to underlying structural abnormality. No seizures were seen throughout the recording. PLora [name]  ECHOCARDIOGRAM COMPLETE  Result Date: 10/29/2021    ECHOCARDIOGRAM REPORT   Patient Name:   Brittany FRITZLERDate of Exam: 10/29/2021 Medical Rec #:  [id number]    Height:       60.0 in Accession #:    [id number]   Weight:       123.9 lb Date of Birth:  [birth year]   BSA:          1.523 m Patient Age:    89years      BP:           130/95 mmHg Patient Gender: F             HR:           86 bpm. Exam Location:  Inpatient Procedure: 2D Echo, Color Doppler and Cardiac Doppler Indications:    Stroke  History:        Patient has prior history of Echocardiogram examinations, most                 recent 11/06/2020. Risk Factors:Hypertension and Diabetes. Lupus.  Sonographer:    AClayton LefortRDCS (AE) Referring Phys: [place] 1. Left ventricular ejection fraction, by estimation, is 55 to 60%. The left ventricle has normal function. The left ventricle has no regional wall motion abnormalities. There is mild asymmetric left ventricular hypertrophy of the basal segment. Left ventricular diastolic parameters are consistent with Grade I diastolic dysfunction (impaired relaxation).  2. Right ventricular systolic function is low normal. The right ventricular size is normal. Tricuspid regurgitation signal is inadequate for assessing PA pressure.  3. The mitral valve is abnormal. Trivial mitral valve regurgitation. Moderate mitral annular calcification.  4. The aortic valve is tricuspid. Aortic valve regurgitation is not visualized.  5. The inferior vena cava is normal in size with greater than 50% respiratory variability, suggesting right atrial pressure of 3 mmHg. Comparison(s):  Prior images reviewed side by side. LVEF remains normal at 55-60%. FINDINGS   Left Ventricle: Left ventricular ejection fraction, by estimation, is 55 to 60%. The left ventricle has normal function. The left ventricle has no regional wall motion abnormalities. The left ventricular internal cavity size was normal in size. There is  mild asymmetric left ventricular hypertrophy of the basal segment. Left ventricular diastolic parameters are consistent with Grade I diastolic dysfunction (impaired relaxation). Right Ventricle: The right ventricular size is normal. No increase in right ventricular wall thickness. Right ventricular systolic function is low normal. Tricuspid regurgitation signal is inadequate for assessing PA pressure. Left Atrium: Left atrial size was normal in size. Right Atrium: Right atrial size was normal in size. Pericardium: There is no evidence of pericardial effusion. Mitral Valve: The mitral valve is abnormal. There is mild thickening of the mitral valve leaflet(s). There is mild calcification of the mitral valve leaflet(s). Moderate mitral annular calcification. Trivial mitral valve regurgitation. Tricuspid Valve: The tricuspid valve is grossly normal. Tricuspid valve regurgitation is trivial. Aortic Valve: The aortic valve is tricuspid. There is mild aortic valve annular calcification. Aortic valve regurgitation is not visualized. Aortic valve mean gradient measures 1.0 mmHg. Aortic valve peak gradient measures 2.5 mmHg. Aortic valve area, by  VTI measures 4.08 cm. Pulmonic Valve: The pulmonic valve was grossly normal. Pulmonic valve regurgitation is trivial. Aorta: The aortic root is normal in size and structure. Venous: The inferior vena cava is normal in size with greater than 50% respiratory variability, suggesting right atrial pressure of 3 mmHg. IAS/Shunts: No atrial level shunt detected by color flow Doppler.  LEFT VENTRICLE PLAX 2D LVIDd:         3.20 cm   Diastology LVIDs:         2.20 cm   LV e' medial:    6.09 cm/s LV PW:         1.10 cm   LV E/e' medial:   13.7 LV IVS:        1.20 cm   LV e' lateral:   5.98 cm/s LVOT diam:     2.30 cm   LV E/e' lateral: 14.0 LV SV:         47 LV SV Index:   31 LVOT Area:     4.15 cm  RIGHT VENTRICLE RV Basal diam:  2.40 cm RV S prime:     6.85 cm/s TAPSE (M-mode): 1.6 cm LEFT ATRIUM             Index        RIGHT ATRIUM          Index LA diam:        2.20 cm 1.44 cm/m   RA Area:     7.79 cm LA Vol (A2C):   12.7 ml 8.34 ml/m   RA Volume:   13.50 ml 8.86 ml/m LA Vol (A4C):   25.1 ml 16.48 ml/m LA Biplane Vol: 19.3 ml 12.67 ml/m  AORTIC VALVE AV Area (Vmax):    3.29 cm AV Area (Vmean):   3.18 cm AV Area (VTI):     4.08 cm AV Vmax:           78.40 cm/s AV Vmean:          53.100 cm/s AV VTI:            0.115 m AV Peak Grad:      2.5 mmHg AV Mean Grad:      1.0 mmHg LVOT  Vmax:         62.00 cm/s LVOT Vmean:        40.600 cm/s LVOT VTI:          0.113 m LVOT/AV VTI ratio: 0.98  AORTA Ao Root diam: 3.30 cm Ao Asc diam:  3.20 cm MITRAL VALVE MV Area (PHT): 4.96 cm     SHUNTS MV Decel Time: 153 msec     Systemic VTI:  0.11 m MV E velocity: 83.70 cm/s   Systemic Diam: 2.30 cm MV A velocity: 119.00 cm/s MV E/A ratio:  0.70 Brittany Lesches MD Electronically signed by Brittany Lesches MD Signature Date/Time: 10/29/2021/3:08:29 PM    Final    MR BRAIN WO CONTRAST  Result Date: 10/29/2021 CLINICAL DATA:  Acute neuro deficit. EXAM: MRI HEAD WITHOUT CONTRAST TECHNIQUE: Multiplanar, multiecho pulse sequences of the brain and surrounding structures were obtained without intravenous contrast. COMPARISON:  CT head 10/28/2021 FINDINGS: Brain: Limited MRI. The patient was not able to hold still and was scanned in the lateral decubitus position. After several sequences, the patient left the scanner and the study was terminated. Diffusion-weighted imaging is diagnostic with mild motion. No acute infarct identified Chronic infarct left cerebellum and left occipital lobe as noted on prior studies. Chronic microvascular ischemic change also noted in  the white matter. No hydrocephalus. IMPRESSION: Limited and incomplete study.  No acute infarct. Electronically Signed   By: Brittany [name] M.D.   On: 10/29/2021 11:11   EEG adult  Result Date: 10/28/2021 Brittany Doom, MD     10/28/2021  5:54 PM History: Concern for seizure Sedation: none Technique: This EEG was acquired with electrodes placed according to the International 10-20 electrode system (including Fp1, Fp2, F3, F4, C3, C4, P3, P4, O1, O2, T3, T4, T5, T6, A1, A2, Fz, Cz, Pz). The following electrodes were missing or displaced: none. Background: The background consists of intermixed alpha and beta activities. There is a well defined posterior dominant rhythm of 9 Hz that is better seen on the right than left. Sleep is not recorded. Photic stimulation: Physiologic driving is not performed EEG Abnormalities: 1) Assymetric PDR Clinical Interpretation: This EEG recorded evidence of a left posterior cortical dysfunction consistent with the patient's know parieto-occipital infarct. There was no seizure or seizure predisposition recorded on this study. Please note that lack of epileptiform activity on EEG does not preclude the possibility of epilepsy. Brittany Rack, MD [facility] [phone] If 7pm- 7am, please page neurology on call as listed in [place].   DG Chest Portable 1 View  Result Date: 10/28/2021 CLINICAL DATA:  Altered level of consciousness EXAM: PORTABLE CHEST 1 VIEW COMPARISON:  09/21/2021 FINDINGS: Single frontal view of the chest demonstrates a stable cardiac silhouette. Chronic bibasilar interstitial prominence and linear areas of consolidation, right greater than left. No new airspace disease, effusion, or pneumothorax. No acute bony abnormalities. IMPRESSION: 1. Chronic bibasilar interstitial prominence with scattered bibasilar consolidation, which has been stable since the 06/25/2021 exam. Findings are likely sequela of postinflammatory scarring or chronic  aspiration pneumonia. 2. Otherwise unremarkable chest x-ray. Electronically Signed   By: Brittany [name] M.D.   On: 10/28/2021 15:02   CT ANGIO HEAD NECK W WO CM W PERF (CODE STROKE)  Result Date: 10/28/2021 CLINICAL DATA:  61 year old female code stroke presentation. Chronic infarcts. EXAM: CT ANGIOGRAPHY HEAD AND NECK TECHNIQUE: Multidetector CT imaging of the head and neck was performed using the standard protocol during bolus administration of intravenous contrast. Multiplanar CT image reconstructions  and MIPs were obtained to evaluate the vascular anatomy. Carotid stenosis measurements (when applicable) are obtained utilizing NASCET criteria, using the distal internal carotid diameter as the denominator. CT Cerebral Perfusion also performed, with planned analysis by RAPID software, but the study is nondiagnostic due to patient motion. RADIATION DOSE REDUCTION: This exam was performed according to the departmental dose-optimization program which includes automated exposure control, adjustment of the mA and/or kV according to patient size and/or use of iterative reconstruction technique. CONTRAST:  190m OMNIPAQUE IOHEXOL 350 MG/ML SOLN COMPARISON:  Plain head CT 1148 hours today. FINDINGS: CT Perfusion: Nondiagnostic due to pronounced patient motion. CTA NECK Skeleton: Cervical spine degeneration. No acute osseous abnormality identified. Upper chest: Centrilobular emphysema. Mild upper lung atelectasis. No superior mediastinal lymphadenopathy. Other neck: No neck mass or lymphadenopathy identified. Aortic arch: Calcified aortic atherosclerosis. Aberrant origin of the right subclavian artery, 4 vessel arch configuration. Right carotid system: Mildly tortuous proximal right CCA. Right CCA and right ICA intermittent plaque with no significant stenosis to the skull base. Left carotid system: Mildly tortuous proximal left CCA. Less pronounced left carotid plaque in the neck with no stenosis to the skull base.  Vertebral arteries: Aberrant origin proximal right subclavian artery with atherosclerosis but no significant stenosis. Right vertebral artery origin is normal. Right vertebral artery is non dominant and diminutive but patent to the skull base with no focal stenosis identified. Proximal left subclavian artery soft and calcified plaque without significant stenosis. Calcified plaque at the left vertebral artery origin but no significant stenosis. Tortuous left V1 segment. Dominant left vertebral artery is patent to the skull base with only mild stenosis. CTA HEAD Posterior circulation: Dominant left V4 segment. Patent PICA origins and no distal vertebral or vertebrobasilar junction stenosis. Patent basilar artery without stenosis. Patent SCA and PCA origins. Right posterior communicating artery is present. Smaller left posterior communicating artery is present. There is up to moderate irregularity of the left PCA with attenuated distal PCA branches, chronic infarct in that vascular territory. Right PCA branches are patent with only mild irregularity. Anterior circulation: Both ICA siphons are patent. Mild siphon plaque, no siphon stenosis. Normal posterior communicating artery origins. Patent carotid termini, MCA and ACA origins. Tortuous A1 segments. Anterior communicating artery and bilateral ACA branches are within normal limits. Right MCA M1 segment and bifurcation are patent with only mild tortuosity and irregularity. No right MCA branch occlusion. Left MCA M1 segment and bifurcation are patent with only mild irregularity. No left MCA branch occlusion is identified. Venous sinuses: Early contrast timing, not well evaluated. Anatomic variants: Dominant left vertebral artery. Aberrant origin of the right subclavian artery. Fetal type right PCA origin. Review of the MIP images confirms the above findings IMPRESSION: 1. Negative for emergent large vessel occlusion. Nondiagnostic CT Perfusion due to patient motion. 2.  Irregular and attenuated Left PCA branches compatible with chronic infarct in that territory. 3. Otherwise generally mild for age atherosclerosis in the head and neck. No other significant arterial stenosis identified. 4. More advanced Aortic Atherosclerosis (ICD10-I70.0). 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 5. Emphysema (ICD10-J43.9). Preliminary CTA results were discussed by telephone with Neurology Dr. SQuinn Axeon 10/28/2021 at 1214 hours. Electronically Signed   By: Brittany AnnM.D.   On: 10/28/2021 12:35   CT HEAD CODE STROKE WO CONTRAST  Result Date: 10/28/2021 CLINICAL DATA:  Code stroke. 61year old female with history of multiple prior infarcts. New aphasia. Possible seizure activity. EXAM: CT HEAD WITHOUT CONTRAST TECHNIQUE: Contiguous axial images were  obtained from the base of the skull through the vertex without intravenous contrast. RADIATION DOSE REDUCTION: This exam was performed according to the departmental dose-optimization program which includes automated exposure control, adjustment of the mA and/or kV according to patient size and/or use of iterative reconstruction technique. COMPARISON:  Head CT 09/21/2021. FINDINGS: Brain: Multifocal chronic infarcts and encephalomalacia appears stable by CT since 09/21/2021. This includes involvement in the anterior right MCA territory, extensive left temporal and occipital lobe encephalomalacia, extensive left cerebellar encephalomalacia. Additional confluent bilateral cerebral white matter hypodensity including deep white matter capsule involvement. No acute intracranial hemorrhage identified. No midline shift, mass effect, or evidence of intracranial mass lesion. No ventriculomegaly. No cortically based acute infarct identified. Vascular: Calcified atherosclerosis at the skull base. No suspicious intracranial vascular hyperdensity. Skull: No acute osseous abnormality identified. Sinuses/Orbits: Visualized paranasal sinuses and mastoids  are stable and well aerated. Other: No acute orbit or scalp soft tissue finding. ASPECTS [facility] Stroke Program Early CT Score) Total score (0-10 with 10 being normal): 10, chronic encephalomalacia. IMPRESSION: 1. Stable CT appearance of advanced ischemic disease since last month. 2. No acute cortically based infarct or acute intracranial hemorrhage identified. ASPECTS 10. 3. Study discussed by telephone with Dr. Quinn [name] on 10/28/2021 at 1155 hours. Electronically Signed   By: Brittany [name] M.D.   On: 10/28/2021 11:58      HISTORY OF PRESENT ILLNESS Brittany [name] is a 61 y.o. 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  TNK given. EEG showed left temporal spikes consistent with her history. Adjusted seizure medications including depakote and vimpat with improvement in mentation. She does still have some perseveration. Recommend follow up with outpatient neurologist for management of medications.   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 No seizures were seen throughout the recording. 2D Echo EF 55 to 60% LDL 64 HgbA1c 13.2 UDS negative SCDs for VTE prophylaxis aspirin 81 mg daily prior to admission, now on aspirin 81 mg daily.  Increased Vimpat to 100 twice daily Ongoing aggressive stroke risk factor management Therapy recommendations: SNF back to [facility] Disposition: Pending   History of seizure 03/2021 admitted for confusion, possible seizure and possible COVID.  EEG showed LPD with brief ictal discharge.  Long-term EEG showed left temporal epileptogenicity, discharged on Keppra  500 Per pharmacy, patient did not refill Keppra   History of stroke 07/2019 admitted for nausea vomiting, headache and generalized weakness.  Found to have DKA.  MRI showed left inferior cerebellum infarct.  LDL 153, A1c 13.5.  Carotid Doppler negative.  EF 55 to 60%. 10/2020 admitted for altered mental status, slurred speech, dysphagia and aphasia with right-sided mild weakness.  CT showed left PCA infarct.  MRI showed early subacute left PCA infarct, old bilateral BG lacunar infarct, left cerebellum old infarct.  Carotid Doppler negative, MRA negative.  EF 65 to 70%.  Discharged on DAPT.   Agitation and combative ? 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 Continue Seroquel Off restrain today  Psychiatry recs- depakote and zyprexa in addition to seroquel On depakote 250 bid Zyprexa PRN   Diabetes, uncontrolled HgbA1c 13.2 goal < 7.0 Hyperglycemia on presentation, glucose 450  Uncontrolled Currently on Levemir BID 6 am/11hs CBG monitoring SSI Q4h now SSI with 6u Novolog with meals added DM education and close PCP follow up   Hypertension Home meds Cardizem 120 Stable Long term BP goal normotensive   Hyperlipidemia Home meds: Lipitor 40 LDL 64, goal < 70 Now on Lipitor 40 Continue statin at discharge   Tobacco abuse Current smoker Smoking cessation counseling will be provided Pt is willing to quit    RN Pressure Injury Documentation: Pressure Injury 09/22/21 Coccyx Right;Left;Medial Stage 2 -  Partial thickness loss of dermis presenting as a shallow open injury with a red, pink wound bed without slough. 3 small open areas. (Active)  09/22/21 1716  Location: Coccyx  Location Orientation: Right;Left;Medial  Staging: Stage 2 -  Partial thickness loss of dermis presenting as a shallow open injury with a red, pink wound bed without slough.  Wound Description (Comments): 3 small open areas.  Present on Admission: Yes  Dressing Type Foam - Lift dressing to assess site every  shift 11/01/21 0845     DISCHARGE EXAM Blood pressure 109/79, pulse (!) 105, temperature 98.9 F (37.2 C), temperature source Oral, resp. rate 17, weight 56.2 kg, SpO2 95 %.  General - Well nourished, well developed, calm and cooperative with exam today    Neuro - awake, alert, eyes open, perseverates on food but is able to answer some questions today.  Oriented to self and place, able to follow simple and two step commands. No gaze palsy, tracking bilaterally. No facial droop. Moving all extremities strong equally, Sensation intact to light touch, FNF intact bilaterally.  Discharge Diet       Diet   Diet Carb Modified Fluid consistency: Thin; Room service appropriate? Yes with Assist   liquids  DISCHARGE PLAN Disposition:  [facility] Continue ASA '81mg'$  as prescribed for secondary stroke prevention.  Ongoing stroke risk factor control by Primary Care Physician at time of discharge Follow-up PCP [name], NP in 2 weeks. Follow-up in [facility] in 8 weeks, office to schedule an appointment.   30 minutes were spent preparing discharge.  Patient seen and examined by NP/APP with MD. MD to update note as needed.   Janine Ores, DNP, FNP-BC [facility] Pager: [phone]

## 2021-11-04 DIAGNOSIS — I639 Cerebral infarction, unspecified: Secondary | ICD-10-CM | POA: Diagnosis not present

## 2021-11-04 LAB — GLUCOSE, CAPILLARY
Glucose-Capillary: 119 mg/dL — ABNORMAL HIGH (ref 70–99)
Glucose-Capillary: 151 mg/dL — ABNORMAL HIGH (ref 70–99)
Glucose-Capillary: 252 mg/dL — ABNORMAL HIGH (ref 70–99)
Glucose-Capillary: 292 mg/dL — ABNORMAL HIGH (ref 70–99)
Glucose-Capillary: 308 mg/dL — ABNORMAL HIGH (ref 70–99)
Glucose-Capillary: 63 mg/dL — ABNORMAL LOW (ref 70–99)

## 2021-11-04 MED ORDER — DIVALPROEX SODIUM 250 MG PO DR TAB
500.0000 mg | DELAYED_RELEASE_TABLET | Freq: Two times a day (BID) | ORAL | Status: DC
  Administered 2021-11-04 – 2021-11-09 (×11): 500 mg via ORAL
  Filled 2021-11-04 (×11): qty 2

## 2021-11-04 NOTE — Progress Notes (Signed)
STROKE TEAM PROGRESS NOTE   SUBJECTIVE (INTERVAL HISTORY) [name].  Patient was agitated earlier this morning and tried to bite staff.  She has been put in restraints.  She continues to perseverate on food.  She remains on wrist restraints and does appear agitated.  And wants to sit up and eat she has been hemodynamically stable and her neurological exam is stable. Awaiting discharge back to [place].  Vital signs stable  OBJECTIVE Temp:  [97.5 F (36.4 C)-99.3 F (37.4 C)] 97.7 F (36.5 C) (09/23 1114) Pulse Rate:  [86-106] 86 (09/23 1114) Resp:  [16-20] 16 (09/23 1114) BP: (109-150)/(79-93) 135/93 (09/23 1114) SpO2:  [92 %-100 %] 100 % (09/23 1114) Weight:  [53.1 kg] 53.1 kg (09/23 0445)  Recent Labs  Lab 11/03/21 2010 11/03/21 2334 11/04/21 0034 11/04/21 0447 11/04/21 0938  GLUCAP 210* 189* 308* 151* 63*   Recent Labs  Lab 10/30/21 1436 10/31/21 0301 11/01/21 0949 11/02/21 0750 11/03/21 0232  NA 135 136 135 131* 137  K 4.5 3.9 4.2 5.1 4.6  CL 105 106 103 100 106  CO2 '23 23 22 '$ 20* 21*  GLUCOSE 201* 186* 377* 413* 263*  BUN '21 20 18 17 15  '$ CREATININE 0.86 0.60 0.82 0.73 0.77  CALCIUM 8.8* 9.2 9.3 8.8* 9.0   No results for input(s): "AST", "ALT", "ALKPHOS", "BILITOT", "PROT", "ALBUMIN" in the last 168 hours. Recent Labs  Lab 10/30/21 1004 10/31/21 0301 11/01/21 0949 11/02/21 0750 11/03/21 0232  WBC 7.4 8.6 7.9 9.0 8.5  HGB 10.4* 10.4* 11.4* 10.9* 10.9*  HCT 31.1* 32.3* 34.7* 34.5* 33.1*  MCV 88.9 90.7 91.3 92.7 91.4  PLT 477* 454* 497* 527* 493*   No results for input(s): "CKTOTAL", "CKMB", "CKMBINDEX", "TROPONINI" in the last 168 hours. No results for input(s): "LABPROT", "INR" in the last 72 hours.  No results for input(s): "COLORURINE", "LABSPEC", "PHURINE", "GLUCOSEU", "HGBUR", "BILIRUBINUR", "KETONESUR", "PROTEINUR", "UROBILINOGEN", "NITRITE", "LEUKOCYTESUR" in the last 72 hours.  Invalid input(s): "APPERANCEUR"       Component Value Date/Time   CHOL 99 10/29/2021 0048   TRIG 40 10/29/2021 0048   HDL 27 (L) 10/29/2021 0048   CHOLHDL 3.7 10/29/2021 0048   VLDL 8 10/29/2021 0048   LDLCALC 64 10/29/2021 0048   Lab Results  Component Value Date   HGBA1C 13.2 (H) 10/30/2021      Component Value Date/Time   LABOPIA NONE DETECTED 10/29/2021 1744   COCAINSCRNUR NONE DETECTED 10/29/2021 1744   COCAINSCRNUR NONE DETECTED 08/13/2021 1717   LABBENZ NONE DETECTED 10/29/2021 1744   AMPHETMU NONE DETECTED 10/29/2021 1744   THCU NONE DETECTED 10/29/2021 1744   LABBARB NONE DETECTED 10/29/2021 1744    No results for input(s): "ETH" in the last 168 hours.  I have personally reviewed the radiological images below and agree with the radiology interpretations.  Overnight EEG with video  Result Date: 10/30/2021 Lora Havens, MD     10/30/2021 10:21 AM Patient Name: Brittany [name] MRN: [id number] Epilepsy Attending: Lora Havens Referring Physician/Provider: Rosalin Hawking, MD Duration: 10/29/2021 1456 to 10/30/2021 1012 Patient history: 61 y.o. 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 EEG to evaluate for seizure Level of alertness: Awake, asleep AEDs during EEG study: Ativan, LCM Technical aspects: This EEG study was done with scalp electrodes positioned according to the 10-20 International system of electrode placement. Electrical activity was reviewed with  band pass filter of 1-'70Hz'$ , sensitivity of 7 uV/mm, display speed of 82m/sec with a '60Hz'$  notched filter applied as appropriate. EEG data were recorded continuously and digitally stored.  Video monitoring was available and reviewed as appropriate. Description: The posterior dominant rhythm consists of 9 Hz activity of moderate voltage (25-35 uV) seen predominantly in posterior head regions, asymmetric ( left<right) and reactive  to eye opening and eye closing. Sleep was characterized by vertex waves, sleep spindles (12 to 14 Hz), maximal frontocentral region. EEG also showed intermittent 3 to 5 Hz theta -delta slowing. Spikes were noted in left temporal region  Hyperventilation and photic stimulation were not performed.   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 There is also cortical dysfunction arising from left temporal region likely secondary to underlying structural abnormality. No seizures were seen throughout the recording. PLora Havens  ECHOCARDIOGRAM COMPLETE  Result Date: 10/29/2021    ECHOCARDIOGRAM REPORT   Patient Name:   TNATORIA ARCHIBALDDate of Exam: 10/29/2021 Medical Rec #:  [id number]    Height:       60.0 in Accession #:    [id number]   Weight:       123.9 lb Date of Birth:  1Apr 21, 1962   BSA:          1.523 m Patient Age:    89years      BP:           130/95 mmHg Patient Gender: F             HR:           86 bpm. Exam Location:  Inpatient Procedure: 2D Echo, Color Doppler and Cardiac Doppler Indications:    Stroke  History:        Patient has prior history of Echocardiogram examinations, most                 recent 11/06/2020. Risk Factors:Hypertension and Diabetes. Lupus.  Sonographer:    AClayton LefortRDCS (AE) Referring Phys: ABurton 1. Left ventricular ejection fraction, by estimation, is 55 to 60%. The left ventricle has normal function. The left ventricle has no regional wall motion abnormalities. There is mild asymmetric left ventricular hypertrophy of the basal segment. Left ventricular diastolic parameters are consistent with Grade I diastolic dysfunction (impaired relaxation).  2. Right ventricular systolic function is low normal. The right ventricular size is normal. Tricuspid regurgitation signal is  inadequate for assessing PA pressure.  3. The mitral valve is abnormal. Trivial mitral valve regurgitation. Moderate mitral annular calcification.  4. The aortic valve is tricuspid. Aortic valve regurgitation is not visualized.  5. The inferior vena cava is normal in size with greater than 50% respiratory variability, suggesting right atrial pressure of 3 mmHg. Comparison(s): Prior images reviewed side by side. LVEF remains normal at 55-60%. FINDINGS  Left Ventricle: Left ventricular ejection fraction, by estimation, is 55 to 60%. The left ventricle has normal function. The left ventricle has no regional wall motion abnormalities. The left ventricular internal cavity size was normal in size. There is  mild asymmetric left ventricular hypertrophy of the basal segment. Left ventricular diastolic parameters are consistent with Grade I diastolic dysfunction (impaired relaxation). Right Ventricle: The right ventricular size is normal. No increase in right ventricular wall thickness. Right ventricular systolic function  is low normal. Tricuspid regurgitation signal is inadequate for assessing PA pressure. Left Atrium: Left atrial size was normal in size. Right Atrium: Right atrial size was normal in size. Pericardium: There is no evidence of pericardial effusion. Mitral Valve: The mitral valve is abnormal. There is mild thickening of the mitral valve leaflet(s). There is mild calcification of the mitral valve leaflet(s). Moderate mitral annular calcification. Trivial mitral valve regurgitation. Tricuspid Valve: The tricuspid valve is grossly normal. Tricuspid valve regurgitation is trivial. Aortic Valve: The aortic valve is tricuspid. There is mild aortic valve annular calcification. Aortic valve regurgitation is not visualized. Aortic valve mean gradient measures 1.0 mmHg. Aortic valve peak gradient measures 2.5 mmHg. Aortic valve area, by  VTI measures 4.08 cm. Pulmonic Valve: The pulmonic valve was grossly normal.  Pulmonic valve regurgitation is trivial. Aorta: The aortic root is normal in size and structure. Venous: The inferior vena cava is normal in size with greater than 50% respiratory variability, suggesting right atrial pressure of 3 mmHg. IAS/Shunts: No atrial level shunt detected by color flow Doppler.  LEFT VENTRICLE PLAX 2D LVIDd:         3.20 cm   Diastology LVIDs:         2.20 cm   LV e' medial:    6.09 cm/s LV PW:         1.10 cm   LV E/e' medial:  13.7 LV IVS:        1.20 cm   LV e' lateral:   5.98 cm/s LVOT diam:     2.30 cm   LV E/e' lateral: 14.0 LV SV:         47 LV SV Index:   31 LVOT Area:     4.15 cm  RIGHT VENTRICLE RV Basal diam:  2.40 cm RV S prime:     6.85 cm/s TAPSE (M-mode): 1.6 cm LEFT ATRIUM             Index        RIGHT ATRIUM          Index LA diam:        2.20 cm 1.44 cm/m   RA Area:     7.79 cm LA Vol (A2C):   12.7 ml 8.34 ml/m   RA Volume:   13.50 ml 8.86 ml/m LA Vol (A4C):   25.1 ml 16.48 ml/m LA Biplane Vol: 19.3 ml 12.67 ml/m  AORTIC VALVE AV Area (Vmax):    3.29 cm AV Area (Vmean):   3.18 cm AV Area (VTI):     4.08 cm AV Vmax:           78.40 cm/s AV Vmean:          53.100 cm/s AV VTI:            0.115 m AV Peak Grad:      2.5 mmHg AV Mean Grad:      1.0 mmHg LVOT Vmax:         62.00 cm/s LVOT Vmean:        40.600 cm/s LVOT VTI:          0.113 m LVOT/AV VTI ratio: 0.98  AORTA Ao Root diam: 3.30 cm Ao Asc diam:  3.20 cm MITRAL VALVE MV Area (PHT): 4.96 cm     SHUNTS MV Decel Time: 153 msec     Systemic VTI:  0.11 m MV E velocity: 83.70 cm/s   Systemic Diam: 2.30 cm MV A velocity: 119.00 cm/s MV  E/A ratio:  0.70 Rozann Lesches MD Electronically signed by Rozann Lesches MD Signature Date/Time: 10/29/2021/3:08:29 PM    Final    MR BRAIN WO CONTRAST  Result Date: 10/29/2021 CLINICAL DATA:  Acute neuro deficit. EXAM: MRI HEAD WITHOUT CONTRAST TECHNIQUE: Multiplanar, multiecho pulse sequences of the brain and surrounding structures were obtained without intravenous contrast.  COMPARISON:  CT head 10/28/2021 FINDINGS: Brain: Limited MRI. The patient was not able to hold still and was scanned in the lateral decubitus position. After several sequences, the patient left the scanner and the study was terminated. Diffusion-weighted imaging is diagnostic with mild motion. No acute infarct identified Chronic infarct left cerebellum and left occipital lobe as noted on prior studies. Chronic microvascular ischemic change also noted in the white matter. No hydrocephalus. IMPRESSION: Limited and incomplete study.  No acute infarct. Electronically Signed   By: Franchot Gallo M.D.   On: 10/29/2021 11:11   EEG adult  Result Date: 10/28/2021 Greta Doom, MD     10/28/2021  5:54 PM History: Concern for seizure Sedation: none Technique: This EEG was acquired with electrodes placed according to the International 10-20 electrode system (including Fp1, Fp2, F3, F4, C3, C4, P3, P4, O1, O2, T3, T4, T5, T6, A1, A2, Fz, Cz, Pz). The following electrodes were missing or displaced: none. Background: The background consists of intermixed alpha and beta activities. There is a well defined posterior dominant rhythm of 9 Hz that is better seen on the right than left. Sleep is not recorded. Photic stimulation: Physiologic driving is not performed EEG Abnormalities: 1) Assymetric PDR Clinical Interpretation: This EEG recorded evidence of a left posterior cortical dysfunction consistent with the patient's know parieto-occipital infarct. There was no seizure or seizure predisposition recorded on this study. Please note that lack of epileptiform activity on EEG does not preclude the possibility of epilepsy. Roland Rack, MD [facility] [phone] If 7pm- 7am, please page neurology on call as listed in [place].   DG Chest Portable 1 View  Result Date: 10/28/2021 CLINICAL DATA:  Altered level of consciousness EXAM: PORTABLE CHEST 1 VIEW COMPARISON:  09/21/2021 FINDINGS: Single frontal  view of the chest demonstrates a stable cardiac silhouette. Chronic bibasilar interstitial prominence and linear areas of consolidation, right greater than left. No new airspace disease, effusion, or pneumothorax. No acute bony abnormalities. IMPRESSION: 1. Chronic bibasilar interstitial prominence with scattered bibasilar consolidation, which has been stable since the 06/25/2021 exam. Findings are likely sequela of postinflammatory scarring or chronic aspiration pneumonia. 2. Otherwise unremarkable chest x-ray. Electronically Signed   By: Randa Ngo M.D.   On: 10/28/2021 15:02   CT ANGIO HEAD NECK W WO CM W PERF (CODE STROKE)  Result Date: 10/28/2021 CLINICAL DATA:  61 year old female code stroke presentation. Chronic infarcts. EXAM: CT ANGIOGRAPHY HEAD AND NECK TECHNIQUE: Multidetector CT imaging of the head and neck was performed using the standard protocol during bolus administration of intravenous contrast. Multiplanar CT image reconstructions and MIPs were obtained to evaluate the vascular anatomy. Carotid stenosis measurements (when applicable) are obtained utilizing NASCET criteria, using the distal internal carotid diameter as the denominator. CT Cerebral Perfusion also performed, with planned analysis by RAPID software, but the study is nondiagnostic due to patient motion. RADIATION DOSE REDUCTION: This exam was performed according to the departmental dose-optimization program which includes automated exposure control, adjustment of the mA and/or kV according to patient size and/or use of iterative reconstruction technique. CONTRAST:  155m OMNIPAQUE IOHEXOL 350 MG/ML SOLN COMPARISON:  Plain head CT  1148 hours today. FINDINGS: CT Perfusion: Nondiagnostic due to pronounced patient motion. CTA NECK Skeleton: Cervical spine degeneration. No acute osseous abnormality identified. Upper chest: Centrilobular emphysema. Mild upper lung atelectasis. No superior mediastinal lymphadenopathy. Other neck: No  neck mass or lymphadenopathy identified. Aortic arch: Calcified aortic atherosclerosis. Aberrant origin of the right subclavian artery, 4 vessel arch configuration. Right carotid system: Mildly tortuous proximal right CCA. Right CCA and right ICA intermittent plaque with no significant stenosis to the skull base. Left carotid system: Mildly tortuous proximal left CCA. Less pronounced left carotid plaque in the neck with no stenosis to the skull base. Vertebral arteries: Aberrant origin proximal right subclavian artery with atherosclerosis but no significant stenosis. Right vertebral artery origin is normal. Right vertebral artery is non dominant and diminutive but patent to the skull base with no focal stenosis identified. Proximal left subclavian artery soft and calcified plaque without significant stenosis. Calcified plaque at the left vertebral artery origin but no significant stenosis. Tortuous left V1 segment. Dominant left vertebral artery is patent to the skull base with only mild stenosis. CTA HEAD Posterior circulation: Dominant left V4 segment. Patent PICA origins and no distal vertebral or vertebrobasilar junction stenosis. Patent basilar artery without stenosis. Patent SCA and PCA origins. Right posterior communicating artery is present. Smaller left posterior communicating artery is present. There is up to moderate irregularity of the left PCA with attenuated distal PCA branches, chronic infarct in that vascular territory. Right PCA branches are patent with only mild irregularity. Anterior circulation: Both ICA siphons are patent. Mild siphon plaque, no siphon stenosis. Normal posterior communicating artery origins. Patent carotid termini, MCA and ACA origins. Tortuous A1 segments. Anterior communicating artery and bilateral ACA branches are within normal limits. Right MCA M1 segment and bifurcation are patent with only mild tortuosity and irregularity. No right MCA branch occlusion. Left MCA M1 segment  and bifurcation are patent with only mild irregularity. No left MCA branch occlusion is identified. Venous sinuses: Early contrast timing, not well evaluated. Anatomic variants: Dominant left vertebral artery. Aberrant origin of the right subclavian artery. Fetal type right PCA origin. Review of the MIP images confirms the above findings IMPRESSION: 1. Negative for emergent large vessel occlusion. Nondiagnostic CT Perfusion due to patient motion. 2. Irregular and attenuated Left PCA branches compatible with chronic infarct in that territory. 3. Otherwise generally mild for age atherosclerosis in the head and neck. No other significant arterial stenosis identified. 4. More advanced Aortic Atherosclerosis (ICD10-I70.0). 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 5. Emphysema (ICD10-J43.9). Preliminary CTA results were discussed by telephone with Neurology Dr. Quinn Axe on 10/28/2021 at 1214 hours. Electronically Signed   By: Genevie Ann M.D.   On: 10/28/2021 12:35   CT HEAD CODE STROKE WO CONTRAST  Result Date: 10/28/2021 CLINICAL DATA:  Code stroke. 61 year old female with history of multiple prior infarcts. New aphasia. Possible seizure activity. EXAM: CT HEAD WITHOUT CONTRAST TECHNIQUE: Contiguous axial images were obtained from the base of the skull through the vertex without intravenous contrast. RADIATION DOSE REDUCTION: This exam was performed according to the departmental dose-optimization program which includes automated exposure control, adjustment of the mA and/or kV according to patient size and/or use of iterative reconstruction technique. COMPARISON:  Head CT 09/21/2021. FINDINGS: Brain: Multifocal chronic infarcts and encephalomalacia appears stable by CT since 09/21/2021. This includes involvement in the anterior right MCA territory, extensive left temporal and occipital lobe encephalomalacia, extensive left cerebellar encephalomalacia. Additional confluent bilateral cerebral white matter  hypodensity including deep  white matter capsule involvement. No acute intracranial hemorrhage identified. No midline shift, mass effect, or evidence of intracranial mass lesion. No ventriculomegaly. No cortically based acute infarct identified. Vascular: Calcified atherosclerosis at the skull base. No suspicious intracranial vascular hyperdensity. Skull: No acute osseous abnormality identified. Sinuses/Orbits: Visualized paranasal sinuses and mastoids are stable and well aerated. Other: No acute orbit or scalp soft tissue finding. ASPECTS [facility] Stroke Program Early CT Score) Total score (0-10 with 10 being normal): 10, chronic encephalomalacia. IMPRESSION: 1. Stable CT appearance of advanced ischemic disease since last month. 2. No acute cortically based infarct or acute intracranial hemorrhage identified. ASPECTS 10. 3. Study discussed by telephone with Dr. Quinn Axe on 10/28/2021 at 1155 hours. Electronically Signed   By: Genevie Ann M.D.   On: 10/28/2021 11:58     PHYSICAL EXAM  Temp:  [97.5 F (36.4 C)-99.3 F (37.4 C)] 97.7 F (36.5 C) (09/23 1114) Pulse Rate:  [86-106] 86 (09/23 1114) Resp:  [16-20] 16 (09/23 1114) BP: (109-150)/(79-93) 135/93 (09/23 1114) SpO2:  [92 %-100 %] 100 % (09/23 1114) Weight:  [53.1 kg] 53.1 kg (09/23 0445)  General - Well nourished, well developed, calm and cooperative with exam today    Neuro - awake, alert, eyes open, perseverates on food but is able to answer some questions today.  Oriented to self and place, able to follow simple and two step commands. No gaze palsy, tracking bilaterally. No facial droop. Moving all extremities strong equally, Sensation intact to light touch, FNF intact bilaterally.   ASSESSMENT/PLAN Ms. SUMAYAH BEARSE is a 61 y.o. 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  TNK given.  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 No seizures were seen throughout the recording. 2D Echo EF 55 to 60% LDL 64 HgbA1c 13.2 UDS negative SCDs for VTE prophylaxis aspirin 81 mg daily prior to admission, now on aspirin 81 mg daily.  Increased Vimpat to 100 milligrams twice daily Ongoing aggressive stroke risk factor management Therapy recommendations: SNF back to [facility] Disposition: Pending  History of seizure 03/2021 admitted for confusion, possible seizure and possible COVID.  EEG showed LPD with brief ictal discharge.  Long-term EEG showed left temporal epileptogenicity, discharged on Keppra 500 Per pharmacy, patient did not refill Keppra  History of stroke 07/2019 admitted for nausea vomiting, headache and generalized weakness.  Found to have DKA.  MRI showed left inferior cerebellum infarct.  LDL 153, A1c 13.5.  Carotid Doppler negative.  EF 55 to 60%. 10/2020 admitted for altered mental status, slurred speech, dysphagia and aphasia with right-sided mild weakness.  CT showed left PCA infarct.  MRI showed early subacute left PCA infarct, old bilateral BG lacunar infarct, left cerebellum old infarct.  Carotid Doppler negative, MRA negative.  EF 65 to 70%.  Discharged on DAPT.  Agitation and combative ? 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 Continue Seroquel Back on restraints today  Psychiatry recs- depakote and zyprexa in addition to seroquel On depakote 250 bid plan to increase to 500 mg twice daily Zyprexa PRN  Diabetes, uncontrolled HgbA1c 13.2 goal < 7.0 Hyperglycemia on presentation, glucose 450 Uncontrolled Currently on Levemir BID 6 am/11hs CBG monitoring  SSI Q4h now SSI with 6u Novolog with meals added DM education  and close PCP follow up  Hypertension Home meds Cardizem 120 Stable Long term BP goal normotensive  Hyperlipidemia Home meds: Lipitor 40 LDL 64, goal < 70 Now on Lipitor 40 Continue statin at discharge  Tobacco abuse Current smoker Smoking cessation counseling will be provided Pt is willing to quit  Other Stroke Risk Factors   Other Active Problems Lower back pain Discoid lupus 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 Agree with note above.  Patient remains intermittently agitated now requiring restraints hence will increase Depakote dose to 100 mg twice daily.  Continue Seroquel and restraints for now.  Greater than 50% time during this 35-minute visit was spent on counseling and coordination of care and discussion with patient and care team answering questions Antony Contras, MD Medical Director [place] Pager: [phone] 11/04/2021 1:27 PM          To contact Stroke Continuity provider, please refer to [email or web address] After hours, contact General Neurology

## 2021-11-04 NOTE — Progress Notes (Signed)
Pt agitated, refuses to sit down. Pt attempted to hit and bite staff.  Pt escorted back to bed . PRN Zyprexa given, provider notified. Pt now sleeping. Will continue to monitor , call bell within reach.

## 2021-11-04 NOTE — Progress Notes (Signed)
Pt agitated and hitting staff as they are trying to  help clean her up. Pt attempted to bite staff. Neurology on call notified. Restraints order placed . Will continue to monitor.

## 2021-11-04 NOTE — Plan of Care (Signed)
  Problem: Education: Goal: Ability to describe self-care measures that may prevent or decrease complications (Diabetes Survival Skills Education) will improve Outcome: Progressing Goal: Individualized Educational Video(s) Outcome: Progressing   Problem: Coping: Goal: Ability to adjust to condition or change in health will improve Outcome: Progressing   Problem: Fluid Volume: Goal: Ability to maintain a balanced intake and output will improve Outcome: Progressing   Problem: Health Behavior/Discharge Planning: Goal: Ability to identify and utilize available resources and services will improve Outcome: Progressing Goal: Ability to manage health-related needs will improve Outcome: Progressing   Problem: Metabolic: Goal: Ability to maintain appropriate glucose levels will improve Outcome: Progressing   Problem: Nutritional: Goal: Maintenance of adequate nutrition will improve Outcome: Progressing Goal: Progress toward achieving an optimal weight will improve Outcome: Progressing   Problem: Skin Integrity: Goal: Risk for impaired skin integrity will decrease Outcome: Progressing   Problem: Tissue Perfusion: Goal: Adequacy of tissue perfusion will improve Outcome: Progressing   Problem: Safety: Goal: Non-violent Restraint(s) Outcome: Progressing   Problem: Education: Goal: Knowledge of General Education information will improve Description: Including pain rating scale, medication(s)/side effects and non-pharmacologic comfort measures Outcome: Progressing   Problem: Health Behavior/Discharge Planning: Goal: Ability to manage health-related needs will improve Outcome: Progressing   Problem: Clinical Measurements: Goal: Ability to maintain clinical measurements within normal limits will improve Outcome: Progressing Goal: Will remain free from infection Outcome: Progressing Goal: Diagnostic test results will improve Outcome: Progressing Goal: Respiratory complications  will improve Outcome: Progressing Goal: Cardiovascular complication will be avoided Outcome: Progressing   Problem: Activity: Goal: Risk for activity intolerance will decrease Outcome: Progressing   Problem: Nutrition: Goal: Adequate nutrition will be maintained Outcome: Progressing   Problem: Coping: Goal: Level of anxiety will decrease Outcome: Progressing   Problem: Elimination: Goal: Will not experience complications related to bowel motility Outcome: Progressing Goal: Will not experience complications related to urinary retention Outcome: Progressing   Problem: Pain Managment: Goal: General experience of comfort will improve Outcome: Progressing   Problem: Safety: Goal: Ability to remain free from injury will improve Outcome: Progressing   Problem: Skin Integrity: Goal: Risk for impaired skin integrity will decrease Outcome: Progressing   Problem: Education: Goal: Knowledge of disease or condition will improve Outcome: Progressing Goal: Knowledge of secondary prevention will improve (SELECT ALL) Outcome: Progressing Goal: Knowledge of patient specific risk factors will improve (INDIVIDUALIZE FOR PATIENT) Outcome: Progressing Goal: Individualized Educational Video(s) Outcome: Progressing   Problem: Coping: Goal: Will verbalize positive feelings about self Outcome: Progressing Goal: Will identify appropriate support needs Outcome: Progressing   Problem: Health Behavior/Discharge Planning: Goal: Ability to manage health-related needs will improve Outcome: Progressing   Problem: Ischemic Stroke/TIA Tissue Perfusion: Goal: Complications of ischemic stroke/TIA will be minimized Outcome: Progressing

## 2021-11-05 DIAGNOSIS — I639 Cerebral infarction, unspecified: Secondary | ICD-10-CM | POA: Diagnosis not present

## 2021-11-05 LAB — GLUCOSE, CAPILLARY
Glucose-Capillary: 118 mg/dL — ABNORMAL HIGH (ref 70–99)
Glucose-Capillary: 138 mg/dL — ABNORMAL HIGH (ref 70–99)
Glucose-Capillary: 199 mg/dL — ABNORMAL HIGH (ref 70–99)
Glucose-Capillary: 231 mg/dL — ABNORMAL HIGH (ref 70–99)
Glucose-Capillary: 256 mg/dL — ABNORMAL HIGH (ref 70–99)
Glucose-Capillary: 269 mg/dL — ABNORMAL HIGH (ref 70–99)

## 2021-11-05 NOTE — Progress Notes (Signed)
STROKE TEAM PROGRESS NOTE   SUBJECTIVE (INTERVAL HISTORY) [name].  Patient is sitting up in a bedside chair.  She is out of mitts and restraints.  She appears pleasant and interactive today.  She has been hemodynamically stable and her neurological exam is stable. Awaiting discharge back to [facility].  Vital signs stable  OBJECTIVE Temp:  [97.5 F (36.4 C)-98.5 F (36.9 C)] 98 F (36.7 C) (09/24 1108) Pulse Rate:  [93-106] 101 (09/24 1108) Resp:  [16-18] 17 (09/24 1108) BP: (112-138)/(81-97) 124/87 (09/24 1108) SpO2:  [91 %-100 %] 100 % (09/24 1108)  Recent Labs  Lab 11/04/21 1945 11/04/21 2352 11/05/21 0342 11/05/21 0838 11/05/21 1111  GLUCAP 252* 119* 269* 138* 118*   Recent Labs  Lab 10/30/21 1436 10/31/21 0301 11/01/21 0949 11/02/21 0750 11/03/21 0232  NA 135 136 135 131* 137  K 4.5 3.9 4.2 5.1 4.6  CL 105 106 103 100 106  CO2 '23 23 22 '$ 20* 21*  GLUCOSE 201* 186* 377* 413* 263*  BUN '21 20 18 17 15  '$ CREATININE 0.86 0.60 0.82 0.73 0.77  CALCIUM 8.8* 9.2 9.3 8.8* 9.0   No results for input(s): "AST", "ALT", "ALKPHOS", "BILITOT", "PROT", "ALBUMIN" in the last 168 hours. Recent Labs  Lab 10/30/21 1004 10/31/21 0301 11/01/21 0949 11/02/21 0750 11/03/21 0232  WBC 7.4 8.6 7.9 9.0 8.5  HGB 10.4* 10.4* 11.4* 10.9* 10.9*  HCT 31.1* 32.3* 34.7* 34.5* 33.1*  MCV 88.9 90.7 91.3 92.7 91.4  PLT 477* 454* 497* 527* 493*   No results for input(s): "CKTOTAL", "CKMB", "CKMBINDEX", "TROPONINI" in the last 168 hours. No results for input(s): "LABPROT", "INR" in the last 72 hours.  No results for input(s): "COLORURINE", "LABSPEC", "PHURINE", "GLUCOSEU", "HGBUR", "BILIRUBINUR", "KETONESUR", "PROTEINUR", "UROBILINOGEN", "NITRITE", "LEUKOCYTESUR" in the last 72 hours.  Invalid input(s): "APPERANCEUR"      Component Value Date/Time   CHOL 99 10/29/2021 0048   TRIG 40 10/29/2021 0048   HDL 27 (L) 10/29/2021 0048   CHOLHDL 3.7 10/29/2021 0048   VLDL  8 10/29/2021 0048   LDLCALC 64 10/29/2021 0048   Lab Results  Component Value Date   HGBA1C 13.2 (H) 10/30/2021      Component Value Date/Time   LABOPIA NONE DETECTED 10/29/2021 1744   COCAINSCRNUR NONE DETECTED 10/29/2021 1744   COCAINSCRNUR NONE DETECTED 08/13/2021 1717   LABBENZ NONE DETECTED 10/29/2021 1744   AMPHETMU NONE DETECTED 10/29/2021 1744   THCU NONE DETECTED 10/29/2021 1744   LABBARB NONE DETECTED 10/29/2021 1744    No results for input(s): "ETH" in the last 168 hours.  I have personally reviewed the radiological images below and agree with the radiology interpretations.  Overnight EEG with video  Result Date: 10/30/2021 Lora Havens, MD     10/30/2021 10:21 AM Patient Name: Brittany [name] MRN: [id number] Epilepsy Attending: Lora Havens Referring Physician/Provider: Rosalin Hawking, MD Duration: 10/29/2021 1456 to 10/30/2021 1012 Patient history: 61 y.o. 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 EEG to evaluate for seizure Level of alertness: Awake, asleep AEDs during EEG study: Ativan, LCM Technical aspects: This EEG study was done with scalp electrodes positioned according to the 10-20 International system of electrode placement. Electrical activity was reviewed with band pass filter of 1-'70Hz'$ , sensitivity of 7 uV/mm, display speed of 44m/sec with a '60Hz'$  notched filter applied as appropriate. EEG data were recorded continuously and  digitally stored.  Video monitoring was available and reviewed as appropriate. Description: The posterior dominant rhythm consists of 9 Hz activity of moderate voltage (25-35 uV) seen predominantly in posterior head regions, asymmetric ( left<right) and reactive to eye opening and eye closing. Sleep was characterized by vertex waves, sleep spindles (12 to 14 Hz), maximal frontocentral region. EEG also showed  intermittent 3 to 5 Hz theta -delta slowing. Spikes were noted in left temporal region  Hyperventilation and photic stimulation were not performed.   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 There is also cortical dysfunction arising from left temporal region likely secondary to underlying structural abnormality. No seizures were seen throughout the recording. Lora Havens   ECHOCARDIOGRAM COMPLETE  Result Date: 10/29/2021    ECHOCARDIOGRAM REPORT   Patient Name:   Brittany [name] Date of Exam: 10/29/2021 Medical Rec #:  [id number]     Height:       60.0 in Accession #:    [id number]    Weight:       123.9 lb Date of Birth:  1960-05-26    BSA:          1.523 m Patient Age:    61 years      BP:           130/95 mmHg Patient Gender: F             HR:           86 bpm. Exam Location:  Inpatient Procedure: 2D Echo, Color Doppler and Cardiac Doppler Indications:    Stroke  History:        Patient has prior history of Echocardiogram examinations, most                 recent 11/06/2020. Risk Factors:Hypertension and Diabetes. Lupus.  Sonographer:    Clayton Lefort RDCS (AE) Referring Phys: [place]  1. Left ventricular ejection fraction, by estimation, is 55 to 60%. The left ventricle has normal function. The left ventricle has no regional wall motion abnormalities. There is mild asymmetric left ventricular hypertrophy of the basal segment. Left ventricular diastolic parameters are consistent with Grade I diastolic dysfunction (impaired relaxation).  2. Right ventricular systolic function is low normal. The right ventricular size is normal. Tricuspid regurgitation signal is inadequate for assessing PA pressure.  3. The mitral valve is abnormal. Trivial mitral valve regurgitation. Moderate mitral annular calcification.  4. The  aortic valve is tricuspid. Aortic valve regurgitation is not visualized.  5. The inferior vena cava is normal in size with greater than 50% respiratory variability, suggesting right atrial pressure of 3 mmHg. Comparison(s): Prior images reviewed side by side. LVEF remains normal at 55-60%. FINDINGS  Left Ventricle: Left ventricular ejection fraction, by estimation, is 55 to 60%. The left ventricle has normal function. The left ventricle has no regional wall motion abnormalities. The left ventricular internal cavity size was normal in size. There is  mild asymmetric left ventricular hypertrophy of the basal segment. Left ventricular diastolic parameters are consistent with Grade I diastolic dysfunction (impaired relaxation). Right Ventricle: The right ventricular size is normal. No increase in right ventricular wall thickness. Right ventricular systolic function is low normal. Tricuspid regurgitation signal is inadequate for assessing PA pressure. Left Atrium: Left atrial size was normal in size. Right Atrium: Right atrial size was  normal in size. Pericardium: There is no evidence of pericardial effusion. Mitral Valve: The mitral valve is abnormal. There is mild thickening of the mitral valve leaflet(s). There is mild calcification of the mitral valve leaflet(s). Moderate mitral annular calcification. Trivial mitral valve regurgitation. Tricuspid Valve: The tricuspid valve is grossly normal. Tricuspid valve regurgitation is trivial. Aortic Valve: The aortic valve is tricuspid. There is mild aortic valve annular calcification. Aortic valve regurgitation is not visualized. Aortic valve mean gradient measures 1.0 mmHg. Aortic valve peak gradient measures 2.5 mmHg. Aortic valve area, by  VTI measures 4.08 cm. Pulmonic Valve: The pulmonic valve was grossly normal. Pulmonic valve regurgitation is trivial. Aorta: The aortic root is normal in size and structure. Venous: The inferior vena cava is normal in size with greater  than 50% respiratory variability, suggesting right atrial pressure of 3 mmHg. IAS/Shunts: No atrial level shunt detected by color flow Doppler.  LEFT VENTRICLE PLAX 2D LVIDd:         3.20 cm   Diastology LVIDs:         2.20 cm   LV e' medial:    6.09 cm/s LV PW:         1.10 cm   LV E/e' medial:  13.7 LV IVS:        1.20 cm   LV e' lateral:   5.98 cm/s LVOT diam:     2.30 cm   LV E/e' lateral: 14.0 LV SV:         47 LV SV Index:   31 LVOT Area:     4.15 cm  RIGHT VENTRICLE RV Basal diam:  2.40 cm RV S prime:     6.85 cm/s TAPSE (M-mode): 1.6 cm LEFT ATRIUM             Index        RIGHT ATRIUM          Index LA diam:        2.20 cm 1.44 cm/m   RA Area:     7.79 cm LA Vol (A2C):   12.7 ml 8.34 ml/m   RA Volume:   13.50 ml 8.86 ml/m LA Vol (A4C):   25.1 ml 16.48 ml/m LA Biplane Vol: 19.3 ml 12.67 ml/m  AORTIC VALVE AV Area (Vmax):    3.29 cm AV Area (Vmean):   3.18 cm AV Area (VTI):     4.08 cm AV Vmax:           78.40 cm/s AV Vmean:          53.100 cm/s AV VTI:            0.115 m AV Peak Grad:      2.5 mmHg AV Mean Grad:      1.0 mmHg LVOT Vmax:         62.00 cm/s LVOT Vmean:        40.600 cm/s LVOT VTI:          0.113 m LVOT/AV VTI ratio: 0.98  AORTA Ao Root diam: 3.30 cm Ao Asc diam:  3.20 cm MITRAL VALVE MV Area (PHT): 4.96 cm     SHUNTS MV Decel Time: 153 msec     Systemic VTI:  0.11 m MV E velocity: 83.70 cm/s   Systemic Diam: 2.30 cm MV A velocity: 119.00 cm/s MV E/A ratio:  0.70 Rozann Lesches MD Electronically signed by Rozann Lesches MD Signature Date/Time: 10/29/2021/3:08:29 PM    Final    MR BRAIN WO  CONTRAST  Result Date: 10/29/2021 CLINICAL DATA:  Acute neuro deficit. EXAM: MRI HEAD WITHOUT CONTRAST TECHNIQUE: Multiplanar, multiecho pulse sequences of the brain and surrounding structures were obtained without intravenous contrast. COMPARISON:  CT head 10/28/2021 FINDINGS: Brain: Limited MRI. The patient was not able to hold still and was scanned in the lateral decubitus position. After  several sequences, the patient left the scanner and the study was terminated. Diffusion-weighted imaging is diagnostic with mild motion. No acute infarct identified Chronic infarct left cerebellum and left occipital lobe as noted on prior studies. Chronic microvascular ischemic change also noted in the white matter. No hydrocephalus. IMPRESSION: Limited and incomplete study.  No acute infarct. Electronically Signed   By: Franchot Gallo M.D.   On: 10/29/2021 11:11   EEG adult  Result Date: 10/28/2021 Greta Doom, MD     10/28/2021  5:54 PM History: Concern for seizure Sedation: none Technique: This EEG was acquired with electrodes placed according to the International 10-20 electrode system (including Fp1, Fp2, F3, F4, C3, C4, P3, P4, O1, O2, T3, T4, T5, T6, A1, A2, Fz, Cz, Pz). The following electrodes were missing or displaced: none. Background: The background consists of intermixed alpha and beta activities. There is a well defined posterior dominant rhythm of 9 Hz that is better seen on the right than left. Sleep is not recorded. Photic stimulation: Physiologic driving is not performed EEG Abnormalities: 1) Assymetric PDR Clinical Interpretation: This EEG recorded evidence of a left posterior cortical dysfunction consistent with the patient's know parieto-occipital infarct. There was no seizure or seizure predisposition recorded on this study. Please note that lack of epileptiform activity on EEG does not preclude the possibility of epilepsy. Roland Rack, MD [facility] [phone] If 7pm- 7am, please page neurology on call as listed in [place].   DG Chest Portable 1 View  Result Date: 10/28/2021 CLINICAL DATA:  Altered level of consciousness EXAM: PORTABLE CHEST 1 VIEW COMPARISON:  09/21/2021 FINDINGS: Single frontal view of the chest demonstrates a stable cardiac silhouette. Chronic bibasilar interstitial prominence and linear areas of consolidation, right greater than left.  No new airspace disease, effusion, or pneumothorax. No acute bony abnormalities. IMPRESSION: 1. Chronic bibasilar interstitial prominence with scattered bibasilar consolidation, which has been stable since the 06/25/2021 exam. Findings are likely sequela of postinflammatory scarring or chronic aspiration pneumonia. 2. Otherwise unremarkable chest x-ray. Electronically Signed   By: Randa Ngo M.D.   On: 10/28/2021 15:02   CT ANGIO HEAD NECK W WO CM W PERF (CODE STROKE)  Result Date: 10/28/2021 CLINICAL DATA:  61 year old female code stroke presentation. Chronic infarcts. EXAM: CT ANGIOGRAPHY HEAD AND NECK TECHNIQUE: Multidetector CT imaging of the head and neck was performed using the standard protocol during bolus administration of intravenous contrast. Multiplanar CT image reconstructions and MIPs were obtained to evaluate the vascular anatomy. Carotid stenosis measurements (when applicable) are obtained utilizing NASCET criteria, using the distal internal carotid diameter as the denominator. CT Cerebral Perfusion also performed, with planned analysis by RAPID software, but the study is nondiagnostic due to patient motion. RADIATION DOSE REDUCTION: This exam was performed according to the departmental dose-optimization program which includes automated exposure control, adjustment of the mA and/or kV according to patient size and/or use of iterative reconstruction technique. CONTRAST:  131m OMNIPAQUE IOHEXOL 350 MG/ML SOLN COMPARISON:  Plain head CT 1148 hours today. FINDINGS: CT Perfusion: Nondiagnostic due to pronounced patient motion. CTA NECK Skeleton: Cervical spine degeneration. No acute osseous abnormality identified. Upper chest: Centrilobular emphysema.  Mild upper lung atelectasis. No superior mediastinal lymphadenopathy. Other neck: No neck mass or lymphadenopathy identified. Aortic arch: Calcified aortic atherosclerosis. Aberrant origin of the right subclavian artery, 4 vessel arch configuration.  Right carotid system: Mildly tortuous proximal right CCA. Right CCA and right ICA intermittent plaque with no significant stenosis to the skull base. Left carotid system: Mildly tortuous proximal left CCA. Less pronounced left carotid plaque in the neck with no stenosis to the skull base. Vertebral arteries: Aberrant origin proximal right subclavian artery with atherosclerosis but no significant stenosis. Right vertebral artery origin is normal. Right vertebral artery is non dominant and diminutive but patent to the skull base with no focal stenosis identified. Proximal left subclavian artery soft and calcified plaque without significant stenosis. Calcified plaque at the left vertebral artery origin but no significant stenosis. Tortuous left V1 segment. Dominant left vertebral artery is patent to the skull base with only mild stenosis. CTA HEAD Posterior circulation: Dominant left V4 segment. Patent PICA origins and no distal vertebral or vertebrobasilar junction stenosis. Patent basilar artery without stenosis. Patent SCA and PCA origins. Right posterior communicating artery is present. Smaller left posterior communicating artery is present. There is up to moderate irregularity of the left PCA with attenuated distal PCA branches, chronic infarct in that vascular territory. Right PCA branches are patent with only mild irregularity. Anterior circulation: Both ICA siphons are patent. Mild siphon plaque, no siphon stenosis. Normal posterior communicating artery origins. Patent carotid termini, MCA and ACA origins. Tortuous A1 segments. Anterior communicating artery and bilateral ACA branches are within normal limits. Right MCA M1 segment and bifurcation are patent with only mild tortuosity and irregularity. No right MCA branch occlusion. Left MCA M1 segment and bifurcation are patent with only mild irregularity. No left MCA branch occlusion is identified. Venous sinuses: Early contrast timing, not well evaluated.  Anatomic variants: Dominant left vertebral artery. Aberrant origin of the right subclavian artery. Fetal type right PCA origin. Review of the MIP images confirms the above findings IMPRESSION: 1. Negative for emergent large vessel occlusion. Nondiagnostic CT Perfusion due to patient motion. 2. Irregular and attenuated Left PCA branches compatible with chronic infarct in that territory. 3. Otherwise generally mild for age atherosclerosis in the head and neck. No other significant arterial stenosis identified. 4. More advanced Aortic Atherosclerosis (ICD10-I70.0). 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 5. Emphysema (ICD10-J43.9). Preliminary CTA results were discussed by telephone with Neurology Dr. Quinn Axe on 10/28/2021 at 1214 hours. Electronically Signed   By: Genevie Ann M.D.   On: 10/28/2021 12:35   CT HEAD CODE STROKE WO CONTRAST  Result Date: 10/28/2021 CLINICAL DATA:  Code stroke. 61 year old female with history of multiple prior infarcts. New aphasia. Possible seizure activity. EXAM: CT HEAD WITHOUT CONTRAST TECHNIQUE: Contiguous axial images were obtained from the base of the skull through the vertex without intravenous contrast. RADIATION DOSE REDUCTION: This exam was performed according to the departmental dose-optimization program which includes automated exposure control, adjustment of the mA and/or kV according to patient size and/or use of iterative reconstruction technique. COMPARISON:  Head CT 09/21/2021. FINDINGS: Brain: Multifocal chronic infarcts and encephalomalacia appears stable by CT since 09/21/2021. This includes involvement in the anterior right MCA territory, extensive left temporal and occipital lobe encephalomalacia, extensive left cerebellar encephalomalacia. Additional confluent bilateral cerebral white matter hypodensity including deep white matter capsule involvement. No acute intracranial hemorrhage identified. No midline shift, mass effect, or evidence of  intracranial mass lesion. No ventriculomegaly. No cortically based acute infarct  identified. Vascular: Calcified atherosclerosis at the skull base. No suspicious intracranial vascular hyperdensity. Skull: No acute osseous abnormality identified. Sinuses/Orbits: Visualized paranasal sinuses and mastoids are stable and well aerated. Other: No acute orbit or scalp soft tissue finding. ASPECTS [facility] Stroke Program Early CT Score) Total score (0-10 with 10 being normal): 10, chronic encephalomalacia. IMPRESSION: 1. Stable CT appearance of advanced ischemic disease since last month. 2. No acute cortically based infarct or acute intracranial hemorrhage identified. ASPECTS 10. 3. Study discussed by telephone with Dr. Quinn Axe on 10/28/2021 at 1155 hours. Electronically Signed   By: Genevie Ann M.D.   On: 10/28/2021 11:58     PHYSICAL EXAM  Temp:  [97.5 F (36.4 C)-98.5 F (36.9 C)] 98 F (36.7 C) (09/24 1108) Pulse Rate:  [93-106] 101 (09/24 1108) Resp:  [16-18] 17 (09/24 1108) BP: (112-138)/(81-97) 124/87 (09/24 1108) SpO2:  [91 %-100 %] 100 % (09/24 1108)  General - Well nourished, well developed, calm and cooperative with exam today    Neuro - awake, alert, eyes open, perseverates on food but is able to answer some questions today.  Oriented to self and place, able to follow simple and two step commands. No gaze palsy, tracking bilaterally. No facial droop. Moving all extremities strong equally, Sensation intact to light touch, FNF intact bilaterally.   ASSESSMENT/PLAN Brittany [name] is a 61 y.o. 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  TNK given.  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 No seizures were seen throughout the recording. 2D Echo EF 55 to 60% LDL 64 HgbA1c 13.2 UDS negative SCDs for VTE prophylaxis aspirin 81 mg daily prior to admission, now on aspirin 81 mg daily.  Increased Vimpat to 100 milligrams twice daily Ongoing aggressive stroke risk factor management Therapy recommendations: SNF back to [facility] Disposition: Pending  History of seizure 03/2021 admitted for confusion, possible seizure and possible COVID.  EEG showed LPD with brief ictal discharge.  Long-term EEG showed left temporal epileptogenicity, discharged on Keppra 500 Per pharmacy, patient did not refill Keppra  History of stroke 07/2019 admitted for nausea vomiting, headache and generalized weakness.  Found to have DKA.  MRI showed left inferior cerebellum infarct.  LDL 153, A1c 13.5.  Carotid Doppler negative.  EF 55 to 60%. 10/2020 admitted for altered mental status, slurred speech, dysphagia and aphasia with right-sided mild weakness.  CT showed left PCA infarct.  MRI showed early subacute left PCA infarct, old bilateral BG lacunar infarct, left cerebellum old infarct.  Carotid Doppler negative, MRA negative.  EF 65 to 70%.  Discharged on DAPT.  Agitation and combative ? 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 Continue Seroquel Back on restraints today  Psychiatry recs- depakote and zyprexa in addition to seroquel On depakote 250 bid plan to increase to 500 mg twice daily Zyprexa PRN  Diabetes, uncontrolled HgbA1c 13.2 goal < 7.0 Hyperglycemia on presentation, glucose 450 Uncontrolled Currently on Levemir BID 6 am/11hs CBG monitoring SSI Q4h now SSI with 6u Novolog with meals added DM education and close PCP follow up  Hypertension Home meds Cardizem 120 Stable Long term BP goal normotensive  Hyperlipidemia Home meds: Lipitor 40  LDL 64, goal < 70 Now on Lipitor 40 Continue  statin at discharge  Tobacco abuse Current smoker Smoking cessation counseling will be provided Pt is willing to quit  Other Stroke Risk Factors   Other Active Problems Lower back pain Discoid lupus 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  Continue Seroquel and Depakote in the current dosages.  Transfer to [facility] out tomorrow if bed available.  Greater than 50% time during this 25-minute visit was spent on counseling and coordination of care and discussion with patient and care team answering questions Antony Contras, MD Medical Director [place] Pager: [phone] 11/05/2021 2:35 PM          To contact Stroke Continuity provider, please refer to [email or web address] After hours, contact General Neurology

## 2021-11-05 NOTE — Plan of Care (Signed)
  Problem: Education: Goal: Ability to describe self-care measures that may prevent or decrease complications (Diabetes Survival Skills Education) will improve Outcome: Progressing   Problem: Metabolic: Goal: Ability to maintain appropriate glucose levels will improve Outcome: Progressing   Problem: Safety: Goal: Non-violent Restraint(s) Outcome: Progressing   Problem: Safety: Goal: Ability to remain free from injury will improve Outcome: Progressing   Problem: Education: Goal: Knowledge of secondary prevention will improve (SELECT ALL) Outcome: Progressing

## 2021-11-06 DIAGNOSIS — I639 Cerebral infarction, unspecified: Secondary | ICD-10-CM | POA: Diagnosis not present

## 2021-11-06 LAB — GLUCOSE, CAPILLARY
Glucose-Capillary: 155 mg/dL — ABNORMAL HIGH (ref 70–99)
Glucose-Capillary: 193 mg/dL — ABNORMAL HIGH (ref 70–99)
Glucose-Capillary: 212 mg/dL — ABNORMAL HIGH (ref 70–99)
Glucose-Capillary: 217 mg/dL — ABNORMAL HIGH (ref 70–99)
Glucose-Capillary: 217 mg/dL — ABNORMAL HIGH (ref 70–99)
Glucose-Capillary: 254 mg/dL — ABNORMAL HIGH (ref 70–99)

## 2021-11-06 NOTE — Progress Notes (Signed)
   11/06/21 1255  Vitals  Temp 99.2 F (37.3 C)  Temp Source Axillary  BP 123/77  MAP (mmHg) 91  BP Location Right Arm  BP Method Automatic  Patient Position (if appropriate) Lying  Pulse Rate (!) 112  Pulse Rate Source Monitor  Resp 14  MEWS COLOR  MEWS Score Color Yellow  Oxygen Therapy  SpO2 98 %  Pain Assessment  Pain Scale 0-10  Pain Score 0  MEWS Score  MEWS Temp 0  MEWS Systolic 0  MEWS Pulse 2  MEWS RR 0  MEWS LOC 0  MEWS Score 2  Provider Notification  Provider Name/Title Cori Razor Torrie  Date Provider Notified 11/06/21  Time Provider Notified 2023  Method of Notification Face-to-face  Provider response No new orders  Date of Provider Response 11/06/21  Time of Provider Response [id number]   Will conitue to monitor

## 2021-11-06 NOTE — Progress Notes (Signed)
Physical Therapy Treatment Patient Details Name: Brittany [name] MRN: [id number] DOB: November 20, 1960 Today's Date: 11/06/2021   History of Present Illness 61 yo female from ALF in [place] with onset of aphasia. pt with L temporal seizure due to hyperglycemia. CT and MRI (-) PMH DM, HTN lupus, prior PCA CVA, seizures, colostomy COPD pancreatic CA    PT Comments    Focus of session was OOB for linen change and in-room mobility, as pt's ostomy bag off and required full clean up and bed change. Pt impulsive, requiring cues throughout to remain seated while therapist managed linens. Pt drank an Ensure while waiting, and pt was able to navigate the room to find trash can and dispose of carton with mod cues to scan room and min assist for balance support. Pt asking for another Ensure once back in bed, and PT explained there was not another one in the room while handing her the call bell. Pt putting mouth on call bell and attempting to drink from it, and did not realize it was not an Ensure. Continue to recommend constant supervision upon d/c mainly due to cognition. Pt is functionally improving overall. Will continue to follow.    Recommendations for follow up therapy are one component of a multi-disciplinary discharge planning process, led by the attending physician.  Recommendations may be updated based on patient status, additional functional criteria and insurance authorization.  Follow Up Recommendations  Home health PT (If facility can manage assist level. If not, will need SNF.)     Assistance Recommended at Discharge Frequent or constant Supervision/Assistance  Patient can return home with the following A little help with walking and/or transfers;A little help with bathing/dressing/bathroom;Assistance with cooking/housework;Direct supervision/assist for medications management;Direct supervision/assist for financial management;Assist for transportation;Help with stairs or ramp for entrance    Equipment Recommendations  None recommended by PT    Recommendations for Other Services       Precautions / Restrictions Precautions Precautions: Fall Precaution Comments: ostomy Restrictions Weight Bearing Restrictions: No     Mobility  Bed Mobility Overal bed mobility: Needs Assistance Bed Mobility: Supine to Sit, Rolling, Sit to Supine Rolling: Min guard   Supine to sit: Min guard Sit to supine: Min guard   General bed mobility comments: Hands on guarding mostly for initiation of movement as pt lethargic. Pt able to transition to/from EOB without physical assistance.    Transfers Overall transfer level: Needs assistance Equipment used: 1 person hand held assist Transfers: Sit to/from Stand Sit to Stand: Min assist           General transfer comment: HHA and balance support to power up to full stand. Grossly at a min assist level. Increased time taken throughout transfers.    Ambulation/Gait Ambulation/Gait assistance: Min assist Gait Distance (Feet): 25 Feet (+15) Assistive device: 1 person hand held assist Gait Pattern/deviations: Decreased stride length, Shuffle, Wide base of support Gait velocity: Decreased Gait velocity interpretation: <1.8 ft/sec, indicate of risk for recurrent falls   General Gait Details: Pt ambulated around room with min assist for balance support and safety. Pt either required HHA or support of furniture as she negotiated around room. Initially pt walked around bed to recliner, and then ambulated from recliner to trash can over by sink to throw away trash before getting back in bed.   Stairs             Wheelchair Mobility    Modified Rankin (Stroke Patients Only)  Balance Overall balance assessment: Needs assistance Sitting-balance support: Feet supported Sitting balance-Leahy Scale: Fair   Postural control: Posterior lean Standing balance support: During functional activity, Bilateral upper extremity  supported Standing balance-Leahy Scale: Poor                              Cognition Arousal/Alertness: Lethargic Behavior During Therapy: Flat affect, Impulsive Overall Cognitive Status: Impaired/Different from baseline Area of Impairment: Attention, Memory, Following commands, Safety/judgement, Awareness, Problem solving                 Orientation Level: Disoriented to, Place, Time, Situation Current Attention Level: Sustained Memory: Decreased short-term memory, Decreased recall of precautions Following Commands: Follows one step commands consistently, Follows one step commands with increased time Safety/Judgement: Decreased awareness of safety, Decreased awareness of deficits Awareness: Intellectual Problem Solving: Slow processing, Difficulty sequencing, Requires verbal cues, Decreased initiation, Requires tactile cues          Exercises      General Comments        Pertinent Vitals/Pain Pain Assessment Pain Assessment: Faces Faces Pain Scale: No hurt    Home Living                          Prior Function            PT Goals (current goals can now be found in the care plan section) Acute Rehab PT Goals Patient Stated Goal: Pt motivated for more food. No real goals stated during session. PT Goal Formulation: Patient unable to participate in goal setting Time For Goal Achievement: 11/07/21 Potential to Achieve Goals: Good Progress towards PT goals: Progressing toward goals    Frequency    Min 2X/week      PT Plan Current plan remains appropriate    Co-evaluation              AM-PAC PT "6 Clicks" Mobility   Outcome Measure  Help needed turning from your back to your side while in a flat bed without using bedrails?: None Help needed moving from lying on your back to sitting on the side of a flat bed without using bedrails?: A Little Help needed moving to and from a bed to a chair (including a wheelchair)?: A  Little Help needed standing up from a chair using your arms (e.g., wheelchair or bedside chair)?: A Little Help needed to walk in hospital room?: A Little Help needed climbing 3-5 steps with a railing? : A Little 6 Click Score: 19    End of Session Equipment Utilized During Treatment: Gait belt Activity Tolerance: Patient tolerated treatment well Patient left: in bed;with call bell/phone within reach;with bed alarm set Nurse Communication: Mobility status PT Visit Diagnosis: Unsteadiness on feet (R26.81);Other abnormalities of gait and mobility (R26.89);Difficulty in walking, not elsewhere classified (R26.2)     Time: 9470-9628 PT Time Calculation (min) (ACUTE ONLY): 26 min  Charges:  $Gait Training: 8-22 mins $Therapeutic Activity: 8-22 mins                     Brittany [name], PT, DPT Acute Rehabilitation Services Secure Chat Preferred Office: [phone]    Thelma Comp 11/06/2021, 11:39 AM

## 2021-11-06 NOTE — TOC Progression Note (Signed)
Transition of Care [facility]) - Progression Note    Patient Details  Name: FIONNA MERRIOTT MRN: [id number] Date of Birth: 01/16/1961  Transition of Care [facility]) CM/SW [place], [place] Phone Number: 11/06/2021, 10:48 AM  Clinical Narrative:   CSW contacted [facility] and spoke with Anderson Malta about assessment. Anderson Malta unsure when assessment will happen, but will call CSW back with time. CSW to follow.    Expected Discharge Plan: Assisted Living Barriers to Discharge: Other (must enter comment) (ALF refusing to take back without a new assessment)  Expected Discharge Plan and Services Expected Discharge Plan: Assisted Living       Living arrangements for the past 2 months: [place], Single Family Home Expected Discharge Date: 11/03/21                                     Social Determinants of Health (SDOH) Interventions    Readmission Risk Interventions    09/22/2021    1:24 PM 03/06/2021    9:13 AM 02/16/2021    2:45 PM  Readmission Risk Prevention Plan  Transportation Screening Complete Complete Complete  Medication Review Press photographer) Complete Complete Complete  PCP or Specialist appointment within 3-5 days of discharge Complete Complete   HRI or [place] Complete    SW Recovery Care/Counseling Consult Complete  Complete  Palliative Care Screening Not Applicable Not Applicable Not [place] Complete Not Applicable Not Applicable

## 2021-11-06 NOTE — Progress Notes (Addendum)
STROKE TEAM PROGRESS NOTE   SUBJECTIVE (INTERVAL HISTORY) [name] with no family at the bedside.  She is out of restraints and is calm and cooperative.  She is ready for discharge back to [facility] pending their evaluation. OBJECTIVE Temp:  [98.2 F (36.8 C)-99.5 F (37.5 C)] 99.5 F (37.5 C) (09/25 1447) Pulse Rate:  [104-118] 118 (09/25 1447) Resp:  [14-19] 16 (09/25 1447) BP: (103-135)/(77-97) 124/77 (09/25 1447) SpO2:  [95 %-98 %] 96 % (09/25 1447)  Recent Labs  Lab 11/05/21 2353 11/06/21 0444 11/06/21 0749 11/06/21 1209 11/06/21 1613  GLUCAP 231* 217* 212* 217* 193*    Recent Labs  Lab 10/31/21 0301 11/01/21 0949 11/02/21 0750 11/03/21 0232  NA 136 135 131* 137  K 3.9 4.2 5.1 4.6  CL 106 103 100 106  CO2 23 22 20* 21*  GLUCOSE 186* 377* 413* 263*  BUN '20 18 17 15  '$ CREATININE 0.60 0.82 0.73 0.77  CALCIUM 9.2 9.3 8.8* 9.0    No results for input(s): "AST", "ALT", "ALKPHOS", "BILITOT", "PROT", "ALBUMIN" in the last 168 hours. Recent Labs  Lab 10/31/21 0301 11/01/21 0949 11/02/21 0750 11/03/21 0232  WBC 8.6 7.9 9.0 8.5  HGB 10.4* 11.4* 10.9* 10.9*  HCT 32.3* 34.7* 34.5* 33.1*  MCV 90.7 91.3 92.7 91.4  PLT 454* 497* 527* 493*    No results for input(s): "CKTOTAL", "CKMB", "CKMBINDEX", "TROPONINI" in the last 168 hours. No results for input(s): "LABPROT", "INR" in the last 72 hours.  No results for input(s): "COLORURINE", "LABSPEC", "PHURINE", "GLUCOSEU", "HGBUR", "BILIRUBINUR", "KETONESUR", "PROTEINUR", "UROBILINOGEN", "NITRITE", "LEUKOCYTESUR" in the last 72 hours.  Invalid input(s): "APPERANCEUR"      Component Value Date/Time   CHOL 99 10/29/2021 0048   TRIG 40 10/29/2021 0048   HDL 27 (L) 10/29/2021 0048   CHOLHDL 3.7 10/29/2021 0048   VLDL 8 10/29/2021 0048   LDLCALC 64 10/29/2021 0048   Lab Results  Component Value Date   HGBA1C 13.2 (H) 10/30/2021      Component Value Date/Time   LABOPIA NONE DETECTED  10/29/2021 1744   COCAINSCRNUR NONE DETECTED 10/29/2021 1744   COCAINSCRNUR NONE DETECTED 08/13/2021 1717   LABBENZ NONE DETECTED 10/29/2021 1744   AMPHETMU NONE DETECTED 10/29/2021 1744   THCU NONE DETECTED 10/29/2021 1744   LABBARB NONE DETECTED 10/29/2021 1744    No results for input(s): "ETH" in the last 168 hours.  I have personally reviewed the radiological images below and agree with the radiology interpretations.  Overnight EEG with video  Result Date: 10/30/2021 Lora Havens, MD     10/30/2021 10:21 AM Patient Name: Brittany [name] MRN: [id number] Epilepsy Attending: Lora Havens Referring Physician/Provider: Rosalin Hawking, MD Duration: 10/29/2021 1456 to 10/30/2021 1012 Patient history: 61 y.o. 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 EEG to evaluate for seizure Level of alertness: Awake, asleep AEDs during EEG study: Ativan, LCM Technical aspects: This EEG study was done with scalp electrodes positioned according to the 10-20 International system of electrode placement. Electrical activity was reviewed with band pass filter of 1-'70Hz'$ , sensitivity of 7 uV/mm, display speed of 34m/sec with a '60Hz'$  notched filter applied as appropriate. EEG data were recorded continuously and digitally stored.  Video monitoring was available and reviewed as appropriate. Description: The posterior dominant rhythm consists of 9 Hz activity of moderate voltage (25-35 uV) seen predominantly in posterior head  regions, asymmetric ( left<right) and reactive to eye opening and eye closing. Sleep was characterized by vertex waves, sleep spindles (12 to 14 Hz), maximal frontocentral region. EEG also showed intermittent 3 to 5 Hz theta -delta slowing. Spikes were noted in left temporal region  Hyperventilation and photic stimulation were not performed.   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 There is also cortical dysfunction arising from left temporal region likely secondary to underlying structural abnormality. No seizures were seen throughout the recording. Lora Havens   ECHOCARDIOGRAM COMPLETE  Result Date: 10/29/2021    ECHOCARDIOGRAM REPORT   Patient Name:   Brittany [name] Date of Exam: 10/29/2021 Medical Rec #:  [id number]     Height:       60.0 in Accession #:    [id number]    Weight:       123.9 lb Date of Birth:  09/11/1960    BSA:          1.523 m Patient Age:    61 years      BP:           130/95 mmHg Patient Gender: F             HR:           86 bpm. Exam Location:  Inpatient Procedure: 2D Echo, Color Doppler and Cardiac Doppler Indications:    Stroke  History:        Patient has prior history of Echocardiogram examinations, most                 recent 11/06/2020. Risk Factors:Hypertension and Diabetes. Lupus.  Sonographer:    Clayton Lefort RDCS (AE) Referring Phys: [place]  1. Left ventricular ejection fraction, by estimation, is 55 to 60%. The left ventricle has normal function. The left ventricle has no regional wall motion abnormalities. There is mild asymmetric left ventricular hypertrophy of the basal segment. Left ventricular diastolic parameters are consistent with Grade I diastolic dysfunction (impaired relaxation).  2. Right ventricular systolic function is low normal. The right ventricular size is normal. Tricuspid regurgitation signal is inadequate for assessing PA pressure.  3. The mitral valve is abnormal. Trivial mitral valve regurgitation. Moderate mitral annular calcification.  4. The aortic valve is tricuspid. Aortic valve regurgitation is not visualized.  5. The inferior vena cava is normal in size with greater than 50% respiratory variability, suggesting  right atrial pressure of 3 mmHg. Comparison(s): Prior images reviewed side by side. LVEF remains normal at 55-60%. FINDINGS  Left Ventricle: Left ventricular ejection fraction, by estimation, is 55 to 60%. The left ventricle has normal function. The left ventricle has no regional wall motion abnormalities. The left ventricular internal cavity size was normal in size. There is  mild asymmetric left ventricular hypertrophy of the basal segment. Left ventricular diastolic parameters are consistent with Grade I diastolic dysfunction (impaired relaxation). Right Ventricle: The right ventricular size is normal. No increase in right ventricular wall thickness. Right ventricular systolic function is low normal. Tricuspid regurgitation signal is inadequate for assessing PA pressure. Left Atrium: Left atrial size was normal in size. Right Atrium: Right atrial size was normal in size. Pericardium: There is no evidence of pericardial effusion. Mitral Valve: The mitral valve is abnormal. There is mild thickening of the mitral valve leaflet(s). There is mild calcification  of the mitral valve leaflet(s). Moderate mitral annular calcification. Trivial mitral valve regurgitation. Tricuspid Valve: The tricuspid valve is grossly normal. Tricuspid valve regurgitation is trivial. Aortic Valve: The aortic valve is tricuspid. There is mild aortic valve annular calcification. Aortic valve regurgitation is not visualized. Aortic valve mean gradient measures 1.0 mmHg. Aortic valve peak gradient measures 2.5 mmHg. Aortic valve area, by  VTI measures 4.08 cm. Pulmonic Valve: The pulmonic valve was grossly normal. Pulmonic valve regurgitation is trivial. Aorta: The aortic root is normal in size and structure. Venous: The inferior vena cava is normal in size with greater than 50% respiratory variability, suggesting right atrial pressure of 3 mmHg. IAS/Shunts: No atrial level shunt detected by color flow Doppler.  LEFT VENTRICLE PLAX 2D LVIDd:          3.20 cm   Diastology LVIDs:         2.20 cm   LV e' medial:    6.09 cm/s LV PW:         1.10 cm   LV E/e' medial:  13.7 LV IVS:        1.20 cm   LV e' lateral:   5.98 cm/s LVOT diam:     2.30 cm   LV E/e' lateral: 14.0 LV SV:         47 LV SV Index:   31 LVOT Area:     4.15 cm  RIGHT VENTRICLE RV Basal diam:  2.40 cm RV S prime:     6.85 cm/s TAPSE (M-mode): 1.6 cm LEFT ATRIUM             Index        RIGHT ATRIUM          Index LA diam:        2.20 cm 1.44 cm/m   RA Area:     7.79 cm LA Vol (A2C):   12.7 ml 8.34 ml/m   RA Volume:   13.50 ml 8.86 ml/m LA Vol (A4C):   25.1 ml 16.48 ml/m LA Biplane Vol: 19.3 ml 12.67 ml/m  AORTIC VALVE AV Area (Vmax):    3.29 cm AV Area (Vmean):   3.18 cm AV Area (VTI):     4.08 cm AV Vmax:           78.40 cm/s AV Vmean:          53.100 cm/s AV VTI:            0.115 m AV Peak Grad:      2.5 mmHg AV Mean Grad:      1.0 mmHg LVOT Vmax:         62.00 cm/s LVOT Vmean:        40.600 cm/s LVOT VTI:          0.113 m LVOT/AV VTI ratio: 0.98  AORTA Ao Root diam: 3.30 cm Ao Asc diam:  3.20 cm MITRAL VALVE MV Area (PHT): 4.96 cm     SHUNTS MV Decel Time: 153 msec     Systemic VTI:  0.11 m MV E velocity: 83.70 cm/s   Systemic Diam: 2.30 cm MV A velocity: 119.00 cm/s MV E/A ratio:  0.70 Rozann Lesches MD Electronically signed by Rozann Lesches MD Signature Date/Time: 10/29/2021/3:08:29 PM    Final    MR BRAIN WO CONTRAST  Result Date: 10/29/2021 CLINICAL DATA:  Acute neuro deficit. EXAM: MRI HEAD WITHOUT CONTRAST TECHNIQUE: Multiplanar, multiecho pulse sequences of the brain and surrounding structures were obtained without intravenous  contrast. COMPARISON:  CT head 10/28/2021 FINDINGS: Brain: Limited MRI. The patient was not able to hold still and was scanned in the lateral decubitus position. After several sequences, the patient left the scanner and the study was terminated. Diffusion-weighted imaging is diagnostic with mild motion. No acute infarct identified Chronic  infarct left cerebellum and left occipital lobe as noted on prior studies. Chronic microvascular ischemic change also noted in the white matter. No hydrocephalus. IMPRESSION: Limited and incomplete study.  No acute infarct. Electronically Signed   By: Franchot Gallo M.D.   On: 10/29/2021 11:11   EEG adult  Result Date: 10/28/2021 Greta Doom, MD     10/28/2021  5:54 PM History: Concern for seizure Sedation: none Technique: This EEG was acquired with electrodes placed according to the International 10-20 electrode system (including Fp1, Fp2, F3, F4, C3, C4, P3, P4, O1, O2, T3, T4, T5, T6, A1, A2, Fz, Cz, Pz). The following electrodes were missing or displaced: none. Background: The background consists of intermixed alpha and beta activities. There is a well defined posterior dominant rhythm of 9 Hz that is better seen on the right than left. Sleep is not recorded. Photic stimulation: Physiologic driving is not performed EEG Abnormalities: 1) Assymetric PDR Clinical Interpretation: This EEG recorded evidence of a left posterior cortical dysfunction consistent with the patient's know parieto-occipital infarct. There was no seizure or seizure predisposition recorded on this study. Please note that lack of epileptiform activity on EEG does not preclude the possibility of epilepsy. Roland Rack, MD [facility] [phone] If 7pm- 7am, please page neurology on call as listed in [place].   DG Chest Portable 1 View  Result Date: 10/28/2021 CLINICAL DATA:  Altered level of consciousness EXAM: PORTABLE CHEST 1 VIEW COMPARISON:  09/21/2021 FINDINGS: Single frontal view of the chest demonstrates a stable cardiac silhouette. Chronic bibasilar interstitial prominence and linear areas of consolidation, right greater than left. No new airspace disease, effusion, or pneumothorax. No acute bony abnormalities. IMPRESSION: 1. Chronic bibasilar interstitial prominence with scattered bibasilar  consolidation, which has been stable since the 06/25/2021 exam. Findings are likely sequela of postinflammatory scarring or chronic aspiration pneumonia. 2. Otherwise unremarkable chest x-ray. Electronically Signed   By: Randa Ngo M.D.   On: 10/28/2021 15:02   CT ANGIO HEAD NECK W WO CM W PERF (CODE STROKE)  Result Date: 10/28/2021 CLINICAL DATA:  61 year old female code stroke presentation. Chronic infarcts. EXAM: CT ANGIOGRAPHY HEAD AND NECK TECHNIQUE: Multidetector CT imaging of the head and neck was performed using the standard protocol during bolus administration of intravenous contrast. Multiplanar CT image reconstructions and MIPs were obtained to evaluate the vascular anatomy. Carotid stenosis measurements (when applicable) are obtained utilizing NASCET criteria, using the distal internal carotid diameter as the denominator. CT Cerebral Perfusion also performed, with planned analysis by RAPID software, but the study is nondiagnostic due to patient motion. RADIATION DOSE REDUCTION: This exam was performed according to the departmental dose-optimization program which includes automated exposure control, adjustment of the mA and/or kV according to patient size and/or use of iterative reconstruction technique. CONTRAST:  152m OMNIPAQUE IOHEXOL 350 MG/ML SOLN COMPARISON:  Plain head CT 1148 hours today. FINDINGS: CT Perfusion: Nondiagnostic due to pronounced patient motion. CTA NECK Skeleton: Cervical spine degeneration. No acute osseous abnormality identified. Upper chest: Centrilobular emphysema. Mild upper lung atelectasis. No superior mediastinal lymphadenopathy. Other neck: No neck mass or lymphadenopathy identified. Aortic arch: Calcified aortic atherosclerosis. Aberrant origin of the right subclavian artery, 4 vessel arch  configuration. Right carotid system: Mildly tortuous proximal right CCA. Right CCA and right ICA intermittent plaque with no significant stenosis to the skull base. Left carotid  system: Mildly tortuous proximal left CCA. Less pronounced left carotid plaque in the neck with no stenosis to the skull base. Vertebral arteries: Aberrant origin proximal right subclavian artery with atherosclerosis but no significant stenosis. Right vertebral artery origin is normal. Right vertebral artery is non dominant and diminutive but patent to the skull base with no focal stenosis identified. Proximal left subclavian artery soft and calcified plaque without significant stenosis. Calcified plaque at the left vertebral artery origin but no significant stenosis. Tortuous left V1 segment. Dominant left vertebral artery is patent to the skull base with only mild stenosis. CTA HEAD Posterior circulation: Dominant left V4 segment. Patent PICA origins and no distal vertebral or vertebrobasilar junction stenosis. Patent basilar artery without stenosis. Patent SCA and PCA origins. Right posterior communicating artery is present. Smaller left posterior communicating artery is present. There is up to moderate irregularity of the left PCA with attenuated distal PCA branches, chronic infarct in that vascular territory. Right PCA branches are patent with only mild irregularity. Anterior circulation: Both ICA siphons are patent. Mild siphon plaque, no siphon stenosis. Normal posterior communicating artery origins. Patent carotid termini, MCA and ACA origins. Tortuous A1 segments. Anterior communicating artery and bilateral ACA branches are within normal limits. Right MCA M1 segment and bifurcation are patent with only mild tortuosity and irregularity. No right MCA branch occlusion. Left MCA M1 segment and bifurcation are patent with only mild irregularity. No left MCA branch occlusion is identified. Venous sinuses: Early contrast timing, not well evaluated. Anatomic variants: Dominant left vertebral artery. Aberrant origin of the right subclavian artery. Fetal type right PCA origin. Review of the MIP images confirms the  above findings IMPRESSION: 1. Negative for emergent large vessel occlusion. Nondiagnostic CT Perfusion due to patient motion. 2. Irregular and attenuated Left PCA branches compatible with chronic infarct in that territory. 3. Otherwise generally mild for age atherosclerosis in the head and neck. No other significant arterial stenosis identified. 4. More advanced Aortic Atherosclerosis (ICD10-I70.0). 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 5. Emphysema (ICD10-J43.9). Preliminary CTA results were discussed by telephone with Neurology Dr. Quinn Axe on 10/28/2021 at 1214 hours. Electronically Signed   By: Genevie Ann M.D.   On: 10/28/2021 12:35   CT HEAD CODE STROKE WO CONTRAST  Result Date: 10/28/2021 CLINICAL DATA:  Code stroke. 61 year old female with history of multiple prior infarcts. New aphasia. Possible seizure activity. EXAM: CT HEAD WITHOUT CONTRAST TECHNIQUE: Contiguous axial images were obtained from the base of the skull through the vertex without intravenous contrast. RADIATION DOSE REDUCTION: This exam was performed according to the departmental dose-optimization program which includes automated exposure control, adjustment of the mA and/or kV according to patient size and/or use of iterative reconstruction technique. COMPARISON:  Head CT 09/21/2021. FINDINGS: Brain: Multifocal chronic infarcts and encephalomalacia appears stable by CT since 09/21/2021. This includes involvement in the anterior right MCA territory, extensive left temporal and occipital lobe encephalomalacia, extensive left cerebellar encephalomalacia. Additional confluent bilateral cerebral white matter hypodensity including deep white matter capsule involvement. No acute intracranial hemorrhage identified. No midline shift, mass effect, or evidence of intracranial mass lesion. No ventriculomegaly. No cortically based acute infarct identified. Vascular: Calcified atherosclerosis at the skull base. No suspicious intracranial  vascular hyperdensity. Skull: No acute osseous abnormality identified. Sinuses/Orbits: Visualized paranasal sinuses and mastoids are stable and well aerated. Other:  No acute orbit or scalp soft tissue finding. ASPECTS [facility] Stroke Program Early CT Score) Total score (0-10 with 10 being normal): 10, chronic encephalomalacia. IMPRESSION: 1. Stable CT appearance of advanced ischemic disease since last month. 2. No acute cortically based infarct or acute intracranial hemorrhage identified. ASPECTS 10. 3. Study discussed by telephone with Dr. Quinn Axe on 10/28/2021 at 1155 hours. Electronically Signed   By: Genevie Ann M.D.   On: 10/28/2021 11:58     PHYSICAL EXAM  Temp:  [98.2 F (36.8 C)-99.5 F (37.5 C)] 99.5 F (37.5 C) (09/25 1447) Pulse Rate:  [104-118] 118 (09/25 1447) Resp:  [14-19] 16 (09/25 1447) BP: (103-135)/(77-97) 124/77 (09/25 1447) SpO2:  [95 %-98 %] 96 % (09/25 1447)  General - Well nourished, well developed, calm and cooperative with exam today    Neuro - awake, alert, eyes open, perseverates on food but is able to answer some questions today.  Oriented to self and place, able to follow simple and two step commands. No gaze palsy, tracking bilaterally. No facial droop. Moving all extremities strong equally, Sensation intact to light touch, FNF intact bilaterally.   ASSESSMENT/PLAN [name] is a 61 y.o. 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  TNK given.  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 No seizures were seen throughout the recording. 2D Echo EF 55 to 60% LDL  64 HgbA1c 13.2 UDS negative SCDs for VTE prophylaxis aspirin 81 mg daily prior to admission, now on aspirin 81 mg daily.  Increased Vimpat to 100 milligrams twice daily Ongoing aggressive stroke risk factor management Therapy recommendations: SNF back to [facility] Disposition: Pending  History of seizure 03/2021 admitted for confusion, possible seizure and possible COVID.  EEG showed LPD with brief ictal discharge.  Long-term EEG showed left temporal epileptogenicity, discharged on Keppra 500 Per pharmacy, patient did not refill Keppra  History of stroke 07/2019 admitted for nausea vomiting, headache and generalized weakness.  Found to have DKA.  MRI showed left inferior cerebellum infarct.  LDL 153, A1c 13.5.  Carotid Doppler negative.  EF 55 to 60%. 10/2020 admitted for altered mental status, slurred speech, dysphagia and aphasia with right-sided mild weakness.  CT showed left PCA infarct.  MRI showed early subacute left PCA infarct, old bilateral BG lacunar infarct, left cerebellum old infarct.  Carotid Doppler negative, MRA negative.  EF 65 to 70%.  Discharged on DAPT.  Agitation and combative ? 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 Continue Seroquel Back on restraints today  Psychiatry recs- depakote and zyprexa in addition to seroquel On depakote 250 bid plan to increase to 500 mg twice daily Zyprexa PRN  Diabetes, uncontrolled HgbA1c 13.2 goal < 7.0 Hyperglycemia on presentation, glucose 450 Uncontrolled Currently on Levemir BID 6 am/11hs CBG monitoring SSI Q4h now SSI with 6u Novolog with meals added DM education and close PCP follow up  Hypertension Home meds Cardizem 120 Stable Long term BP goal normotensive  Hyperlipidemia Home meds: Lipitor 40 LDL 64, goal < 70 Now on Lipitor 40 Continue statin at discharge  Tobacco abuse Current smoker Smoking cessation counseling will be provided Pt is willing to quit  Other  Stroke Risk Factors   Other  Active Problems Lower back pain Discoid lupus 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 Agree with note above.    Antony Contras, MD Medical Director [place] Pager: [phone] 11/06/2021 6:27 PM    To contact Stroke Continuity provider, please refer to [email or web address] After hours, contact General Neurology

## 2021-11-07 DIAGNOSIS — I639 Cerebral infarction, unspecified: Secondary | ICD-10-CM | POA: Diagnosis not present

## 2021-11-07 LAB — GLUCOSE, CAPILLARY
Glucose-Capillary: 147 mg/dL — ABNORMAL HIGH (ref 70–99)
Glucose-Capillary: 127 mg/dL — ABNORMAL HIGH (ref 70–99)
Glucose-Capillary: 139 mg/dL — ABNORMAL HIGH (ref 70–99)
Glucose-Capillary: 323 mg/dL — ABNORMAL HIGH (ref 70–99)
Glucose-Capillary: 305 mg/dL — ABNORMAL HIGH (ref 70–99)
Glucose-Capillary: 156 mg/dL — ABNORMAL HIGH (ref 70–99)

## 2021-11-07 NOTE — Progress Notes (Addendum)
STROKE TEAM PROGRESS NOTE   SUBJECTIVE (INTERVAL HISTORY) [name] with no family at the bedside.  She is cooperative but becomes tearful at times.  She is ready to discharge back to [facility] pending their evaluation. OBJECTIVE Temp:  [97.8 F (36.6 C)-100.2 F (37.9 C)] 98.7 F (37.1 C) (09/26 1153) Pulse Rate:  [77-116] 96 (09/26 1153) Resp:  [12-23] 20 (09/26 1153) BP: (98-143)/(68-93) 112/89 (09/26 1153) SpO2:  [77 %-100 %] 77 % (09/26 1153)  Recent Labs  Lab 11/06/21 2011 11/06/21 2358 11/07/21 0515 11/07/21 0756 11/07/21 1148  GLUCAP 155* 254* 147* 127* 139*    Recent Labs  Lab 11/01/21 0949 11/02/21 0750 11/03/21 0232  NA 135 131* 137  K 4.2 5.1 4.6  CL 103 100 106  CO2 22 20* 21*  GLUCOSE 377* 413* 263*  BUN '18 17 15  '$ CREATININE 0.82 0.73 0.77  CALCIUM 9.3 8.8* 9.0    No results for input(s): "AST", "ALT", "ALKPHOS", "BILITOT", "PROT", "ALBUMIN" in the last 168 hours. Recent Labs  Lab 11/01/21 0949 11/02/21 0750 11/03/21 0232  WBC 7.9 9.0 8.5  HGB 11.4* 10.9* 10.9*  HCT 34.7* 34.5* 33.1*  MCV 91.3 92.7 91.4  PLT 497* 527* 493*    No results for input(s): "CKTOTAL", "CKMB", "CKMBINDEX", "TROPONINI" in the last 168 hours. No results for input(s): "LABPROT", "INR" in the last 72 hours.  No results for input(s): "COLORURINE", "LABSPEC", "PHURINE", "GLUCOSEU", "HGBUR", "BILIRUBINUR", "KETONESUR", "PROTEINUR", "UROBILINOGEN", "NITRITE", "LEUKOCYTESUR" in the last 72 hours.  Invalid input(s): "APPERANCEUR"      Component Value Date/Time   CHOL 99 10/29/2021 0048   TRIG 40 10/29/2021 0048   HDL 27 (L) 10/29/2021 0048   CHOLHDL 3.7 10/29/2021 0048   VLDL 8 10/29/2021 0048   LDLCALC 64 10/29/2021 0048   Lab Results  Component Value Date   HGBA1C 13.2 (H) 10/30/2021      Component Value Date/Time   LABOPIA NONE DETECTED 10/29/2021 1744   COCAINSCRNUR NONE DETECTED 10/29/2021 1744   COCAINSCRNUR NONE DETECTED 08/13/2021  1717   LABBENZ NONE DETECTED 10/29/2021 1744   AMPHETMU NONE DETECTED 10/29/2021 1744   THCU NONE DETECTED 10/29/2021 1744   LABBARB NONE DETECTED 10/29/2021 1744    No results for input(s): "ETH" in the last 168 hours.  I have personally reviewed the radiological images below and agree with the radiology interpretations.  Overnight EEG with video  Result Date: 10/30/2021 Lora Havens, MD     10/30/2021 10:21 AM Patient Name: Brittany [name] MRN: [id number] Epilepsy Attending: Lora Havens Referring Physician/Provider: Rosalin Hawking, MD Duration: 10/29/2021 1456 to 10/30/2021 1012 Patient history: 61 y.o. 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 EEG to evaluate for seizure Level of alertness: Awake, asleep AEDs during EEG study: Ativan, LCM Technical aspects: This EEG study was done with scalp electrodes positioned according to the 10-20 International system of electrode placement. Electrical activity was reviewed with band pass filter of 1-'70Hz'$ , sensitivity of 7 uV/mm, display speed of 9m/sec with a '60Hz'$  notched filter applied as appropriate. EEG data were recorded continuously and digitally stored.  Video monitoring was available and reviewed as appropriate. Description: The posterior dominant rhythm consists of 9 Hz activity of moderate voltage (25-35 uV) seen predominantly in posterior head regions, asymmetric ( left<right) and reactive to eye opening and eye closing. Sleep was characterized by vertex waves, sleep  spindles (12 to 14 Hz), maximal frontocentral region. EEG also showed intermittent 3 to 5 Hz theta -delta slowing. Spikes were noted in left temporal region  Hyperventilation and photic stimulation were not performed.   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 There is also cortical dysfunction arising from left temporal region likely secondary to underlying structural abnormality. No seizures were seen throughout the recording. Lora Havens   ECHOCARDIOGRAM COMPLETE  Result Date: 10/29/2021    ECHOCARDIOGRAM REPORT   Patient Name:   Brittany [name] Date of Exam: 10/29/2021 Medical Rec #:  [id number]     Height:       60.0 in Accession #:    [id number]    Weight:       123.9 lb Date of Birth:  1960-10-14    BSA:          1.523 m Patient Age:    61 years      BP:           130/95 mmHg Patient Gender: F             HR:           86 bpm. Exam Location:  Inpatient Procedure: 2D Echo, Color Doppler and Cardiac Doppler Indications:    Stroke  History:        Patient has prior history of Echocardiogram examinations, most                 recent 11/06/2020. Risk Factors:Hypertension and Diabetes. Lupus.  Sonographer:    Clayton Lefort RDCS (AE) Referring Phys: [place]  1. Left ventricular ejection fraction, by estimation, is 55 to 60%. The left ventricle has normal function. The left ventricle has no regional wall motion abnormalities. There is mild asymmetric left ventricular hypertrophy of the basal segment. Left ventricular diastolic parameters are consistent with Grade I diastolic dysfunction (impaired relaxation).  2. Right ventricular systolic function is low normal. The right ventricular size is normal. Tricuspid regurgitation signal is inadequate for assessing PA pressure.  3. The mitral valve is abnormal. Trivial mitral valve regurgitation. Moderate mitral annular calcification.  4. The aortic valve is tricuspid. Aortic valve regurgitation is not visualized.  5. The inferior vena cava is normal in size with greater than 50% respiratory variability, suggesting right atrial pressure of 3 mmHg. Comparison(s): Prior images reviewed side by side. LVEF remains  normal at 55-60%. FINDINGS  Left Ventricle: Left ventricular ejection fraction, by estimation, is 55 to 60%. The left ventricle has normal function. The left ventricle has no regional wall motion abnormalities. The left ventricular internal cavity size was normal in size. There is  mild asymmetric left ventricular hypertrophy of the basal segment. Left ventricular diastolic parameters are consistent with Grade I diastolic dysfunction (impaired relaxation). Right Ventricle: The right ventricular size is normal. No increase in right ventricular wall thickness. Right ventricular systolic function is low normal. Tricuspid regurgitation signal is inadequate for assessing PA pressure. Left Atrium: Left atrial size was normal in size. Right Atrium: Right atrial size was normal in size. Pericardium: There is no evidence of pericardial effusion. Mitral Valve: The mitral valve is abnormal. There is mild thickening of the mitral valve leaflet(s). There is mild calcification of the mitral valve leaflet(s). Moderate mitral annular calcification. Trivial mitral valve regurgitation. Tricuspid Valve: The tricuspid valve is  grossly normal. Tricuspid valve regurgitation is trivial. Aortic Valve: The aortic valve is tricuspid. There is mild aortic valve annular calcification. Aortic valve regurgitation is not visualized. Aortic valve mean gradient measures 1.0 mmHg. Aortic valve peak gradient measures 2.5 mmHg. Aortic valve area, by  VTI measures 4.08 cm. Pulmonic Valve: The pulmonic valve was grossly normal. Pulmonic valve regurgitation is trivial. Aorta: The aortic root is normal in size and structure. Venous: The inferior vena cava is normal in size with greater than 50% respiratory variability, suggesting right atrial pressure of 3 mmHg. IAS/Shunts: No atrial level shunt detected by color flow Doppler.  LEFT VENTRICLE PLAX 2D LVIDd:         3.20 cm   Diastology LVIDs:         2.20 cm   LV e' medial:    6.09 cm/s LV PW:          1.10 cm   LV E/e' medial:  13.7 LV IVS:        1.20 cm   LV e' lateral:   5.98 cm/s LVOT diam:     2.30 cm   LV E/e' lateral: 14.0 LV SV:         47 LV SV Index:   31 LVOT Area:     4.15 cm  RIGHT VENTRICLE RV Basal diam:  2.40 cm RV S prime:     6.85 cm/s TAPSE (M-mode): 1.6 cm LEFT ATRIUM             Index        RIGHT ATRIUM          Index LA diam:        2.20 cm 1.44 cm/m   RA Area:     7.79 cm LA Vol (A2C):   12.7 ml 8.34 ml/m   RA Volume:   13.50 ml 8.86 ml/m LA Vol (A4C):   25.1 ml 16.48 ml/m LA Biplane Vol: 19.3 ml 12.67 ml/m  AORTIC VALVE AV Area (Vmax):    3.29 cm AV Area (Vmean):   3.18 cm AV Area (VTI):     4.08 cm AV Vmax:           78.40 cm/s AV Vmean:          53.100 cm/s AV VTI:            0.115 m AV Peak Grad:      2.5 mmHg AV Mean Grad:      1.0 mmHg LVOT Vmax:         62.00 cm/s LVOT Vmean:        40.600 cm/s LVOT VTI:          0.113 m LVOT/AV VTI ratio: 0.98  AORTA Ao Root diam: 3.30 cm Ao Asc diam:  3.20 cm MITRAL VALVE MV Area (PHT): 4.96 cm     SHUNTS MV Decel Time: 153 msec     Systemic VTI:  0.11 m MV E velocity: 83.70 cm/s   Systemic Diam: 2.30 cm MV A velocity: 119.00 cm/s MV E/A ratio:  0.70 Rozann Lesches MD Electronically signed by Rozann Lesches MD Signature Date/Time: 10/29/2021/3:08:29 PM    Final    MR BRAIN WO CONTRAST  Result Date: 10/29/2021 CLINICAL DATA:  Acute neuro deficit. EXAM: MRI HEAD WITHOUT CONTRAST TECHNIQUE: Multiplanar, multiecho pulse sequences of the brain and surrounding structures were obtained without intravenous contrast. COMPARISON:  CT head 10/28/2021 FINDINGS: Brain: Limited MRI. The patient was not able to hold still and  was scanned in the lateral decubitus position. After several sequences, the patient left the scanner and the study was terminated. Diffusion-weighted imaging is diagnostic with mild motion. No acute infarct identified Chronic infarct left cerebellum and left occipital lobe as noted on prior studies. Chronic microvascular  ischemic change also noted in the white matter. No hydrocephalus. IMPRESSION: Limited and incomplete study.  No acute infarct. Electronically Signed   By: Franchot Gallo M.D.   On: 10/29/2021 11:11   EEG adult  Result Date: 10/28/2021 Greta Doom, MD     10/28/2021  5:54 PM History: Concern for seizure Sedation: none Technique: This EEG was acquired with electrodes placed according to the International 10-20 electrode system (including Fp1, Fp2, F3, F4, C3, C4, P3, P4, O1, O2, T3, T4, T5, T6, A1, A2, Fz, Cz, Pz). The following electrodes were missing or displaced: none. Background: The background consists of intermixed alpha and beta activities. There is a well defined posterior dominant rhythm of 9 Hz that is better seen on the right than left. Sleep is not recorded. Photic stimulation: Physiologic driving is not performed EEG Abnormalities: 1) Assymetric PDR Clinical Interpretation: This EEG recorded evidence of a left posterior cortical dysfunction consistent with the patient's know parieto-occipital infarct. There was no seizure or seizure predisposition recorded on this study. Please note that lack of epileptiform activity on EEG does not preclude the possibility of epilepsy. Roland Rack, MD [facility] [phone] If 7pm- 7am, please page neurology on call as listed in [facility].   DG Chest Portable 1 View  Result Date: 10/28/2021 CLINICAL DATA:  Altered level of consciousness EXAM: PORTABLE CHEST 1 VIEW COMPARISON:  09/21/2021 FINDINGS: Single frontal view of the chest demonstrates a stable cardiac silhouette. Chronic bibasilar interstitial prominence and linear areas of consolidation, right greater than left. No new airspace disease, effusion, or pneumothorax. No acute bony abnormalities. IMPRESSION: 1. Chronic bibasilar interstitial prominence with scattered bibasilar consolidation, which has been stable since the 06/25/2021 exam. Findings are likely sequela of  postinflammatory scarring or chronic aspiration pneumonia. 2. Otherwise unremarkable chest x-ray. Electronically Signed   By: Randa Ngo M.D.   On: 10/28/2021 15:02   CT ANGIO HEAD NECK W WO CM W PERF (CODE STROKE)  Result Date: 10/28/2021 CLINICAL DATA:  60 year old female code stroke presentation. Chronic infarcts. EXAM: CT ANGIOGRAPHY HEAD AND NECK TECHNIQUE: Multidetector CT imaging of the head and neck was performed using the standard protocol during bolus administration of intravenous contrast. Multiplanar CT image reconstructions and MIPs were obtained to evaluate the vascular anatomy. Carotid stenosis measurements (when applicable) are obtained utilizing NASCET criteria, using the distal internal carotid diameter as the denominator. CT Cerebral Perfusion also performed, with planned analysis by RAPID software, but the study is nondiagnostic due to patient motion. RADIATION DOSE REDUCTION: This exam was performed according to the departmental dose-optimization program which includes automated exposure control, adjustment of the mA and/or kV according to patient size and/or use of iterative reconstruction technique. CONTRAST:  129m OMNIPAQUE IOHEXOL 350 MG/ML SOLN COMPARISON:  Plain head CT 1148 hours today. FINDINGS: CT Perfusion: Nondiagnostic due to pronounced patient motion. CTA NECK Skeleton: Cervical spine degeneration. No acute osseous abnormality identified. Upper chest: Centrilobular emphysema. Mild upper lung atelectasis. No superior mediastinal lymphadenopathy. Other neck: No neck mass or lymphadenopathy identified. Aortic arch: Calcified aortic atherosclerosis. Aberrant origin of the right subclavian artery, 4 vessel arch configuration. Right carotid system: Mildly tortuous proximal right CCA. Right CCA and right ICA intermittent plaque with no significant  stenosis to the skull base. Left carotid system: Mildly tortuous proximal left CCA. Less pronounced left carotid plaque in the neck  with no stenosis to the skull base. Vertebral arteries: Aberrant origin proximal right subclavian artery with atherosclerosis but no significant stenosis. Right vertebral artery origin is normal. Right vertebral artery is non dominant and diminutive but patent to the skull base with no focal stenosis identified. Proximal left subclavian artery soft and calcified plaque without significant stenosis. Calcified plaque at the left vertebral artery origin but no significant stenosis. Tortuous left V1 segment. Dominant left vertebral artery is patent to the skull base with only mild stenosis. CTA HEAD Posterior circulation: Dominant left V4 segment. Patent PICA origins and no distal vertebral or vertebrobasilar junction stenosis. Patent basilar artery without stenosis. Patent SCA and PCA origins. Right posterior communicating artery is present. Smaller left posterior communicating artery is present. There is up to moderate irregularity of the left PCA with attenuated distal PCA branches, chronic infarct in that vascular territory. Right PCA branches are patent with only mild irregularity. Anterior circulation: Both ICA siphons are patent. Mild siphon plaque, no siphon stenosis. Normal posterior communicating artery origins. Patent carotid termini, MCA and ACA origins. Tortuous A1 segments. Anterior communicating artery and bilateral ACA branches are within normal limits. Right MCA M1 segment and bifurcation are patent with only mild tortuosity and irregularity. No right MCA branch occlusion. Left MCA M1 segment and bifurcation are patent with only mild irregularity. No left MCA branch occlusion is identified. Venous sinuses: Early contrast timing, not well evaluated. Anatomic variants: Dominant left vertebral artery. Aberrant origin of the right subclavian artery. Fetal type right PCA origin. Review of the MIP images confirms the above findings IMPRESSION: 1. Negative for emergent large vessel occlusion. Nondiagnostic CT  Perfusion due to patient motion. 2. Irregular and attenuated Left PCA branches compatible with chronic infarct in that territory. 3. Otherwise generally mild for age atherosclerosis in the head and neck. No other significant arterial stenosis identified. 4. More advanced Aortic Atherosclerosis (ICD10-I70.0). 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 5. Emphysema (ICD10-J43.9). Preliminary CTA results were discussed by telephone with Neurology Dr. Quinn Axe on 10/28/2021 at 1214 hours. Electronically Signed   By: Genevie Ann M.D.   On: 10/28/2021 12:35   CT HEAD CODE STROKE WO CONTRAST  Result Date: 10/28/2021 CLINICAL DATA:  Code stroke. 61 year old female with history of multiple prior infarcts. New aphasia. Possible seizure activity. EXAM: CT HEAD WITHOUT CONTRAST TECHNIQUE: Contiguous axial images were obtained from the base of the skull through the vertex without intravenous contrast. RADIATION DOSE REDUCTION: This exam was performed according to the departmental dose-optimization program which includes automated exposure control, adjustment of the mA and/or kV according to patient size and/or use of iterative reconstruction technique. COMPARISON:  Head CT 09/21/2021. FINDINGS: Brain: Multifocal chronic infarcts and encephalomalacia appears stable by CT since 09/21/2021. This includes involvement in the anterior right MCA territory, extensive left temporal and occipital lobe encephalomalacia, extensive left cerebellar encephalomalacia. Additional confluent bilateral cerebral white matter hypodensity including deep white matter capsule involvement. No acute intracranial hemorrhage identified. No midline shift, mass effect, or evidence of intracranial mass lesion. No ventriculomegaly. No cortically based acute infarct identified. Vascular: Calcified atherosclerosis at the skull base. No suspicious intracranial vascular hyperdensity. Skull: No acute osseous abnormality identified. Sinuses/Orbits:  Visualized paranasal sinuses and mastoids are stable and well aerated. Other: No acute orbit or scalp soft tissue finding. ASPECTS [facility] Stroke Program Early CT Score) Total score (0-10 with  10 being normal): 10, chronic encephalomalacia. IMPRESSION: 1. Stable CT appearance of advanced ischemic disease since last month. 2. No acute cortically based infarct or acute intracranial hemorrhage identified. ASPECTS 10. 3. Study discussed by telephone with Dr. Quinn Axe on 10/28/2021 at 1155 hours. Electronically Signed   By: Genevie Ann M.D.   On: 10/28/2021 11:58     PHYSICAL EXAM  Temp:  [97.8 F (36.6 C)-100.2 F (37.9 C)] 98.7 F (37.1 C) (09/26 1153) Pulse Rate:  [77-116] 96 (09/26 1153) Resp:  [12-23] 20 (09/26 1153) BP: (98-143)/(68-93) 112/89 (09/26 1153) SpO2:  [77 %-100 %] 77 % (09/26 1153)  General - Well nourished, well developed, cooperative with exam today but tearful at times    Neuro - awake, alert, eyes open, perseverates on food but is able to answer some questions today.  Oriented to self and place, able to follow simple and two step commands. No gaze palsy, tracking bilaterally. No facial droop. Moving all extremities strong equally, Sensation intact to light touch, FNF intact bilaterally.   ASSESSMENT/PLAN Brittany [name] is a 61 y.o. 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  TNK given.  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 No seizures were seen throughout the recording. 2D Echo EF 55 to 60% LDL 64 HgbA1c 13.2 UDS negative SCDs for VTE prophylaxis aspirin 81 mg daily prior  to admission, now on aspirin 81 mg daily.  Increased Vimpat to 100 milligrams twice daily Ongoing aggressive stroke risk factor management Therapy recommendations: SNF back to [facility] Disposition: Pending  History of seizure 03/2021 admitted for confusion, possible seizure and possible COVID.  EEG showed LPD with brief ictal discharge.  Long-term EEG showed left temporal epileptogenicity, discharged on Keppra 500 Per pharmacy, patient did not refill Keppra  History of stroke 07/2019 admitted for nausea vomiting, headache and generalized weakness.  Found to have DKA.  MRI showed left inferior cerebellum infarct.  LDL 153, A1c 13.5.  Carotid Doppler negative.  EF 55 to 60%. 10/2020 admitted for altered mental status, slurred speech, dysphagia and aphasia with right-sided mild weakness.  CT showed left PCA infarct.  MRI showed early subacute left PCA infarct, old bilateral BG lacunar infarct, left cerebellum old infarct.  Carotid Doppler negative, MRA negative.  EF 65 to 70%.  Discharged on DAPT.  Agitation and combative ? 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 Continue Seroquel Back on restraints today  Psychiatry recs- depakote and zyprexa in addition to seroquel On depakote 250 bid plan to increase to 500 mg twice daily Zyprexa PRN  Diabetes, uncontrolled HgbA1c 13.2 goal < 7.0 Hyperglycemia on presentation, glucose 450 Uncontrolled Currently on Levemir BID 6 am/11hs CBG monitoring SSI Q4h now SSI with 6u Novolog with meals added DM education and close PCP follow up  Hypertension Home meds Cardizem 120 Stable Long term BP goal normotensive  Hyperlipidemia Home meds: Lipitor 40 LDL 64, goal < 70 Now on Lipitor 40 Continue statin at discharge  Tobacco abuse Current smoker Smoking cessation counseling will be provided Pt is willing to quit  Other Stroke Risk Factors   Other Active Problems Lower back pain Discoid lupus 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 Agree with note above.    Antony Contras, MD Medical Director [facility] Pager: [phone] 11/07/2021 5:01 PM   To contact Stroke Continuity provider, please refer to [email or web address] After hours, contact General Neurology

## 2021-11-07 NOTE — Plan of Care (Signed)

## 2021-11-07 NOTE — Progress Notes (Signed)
Occupational Therapy Treatment Patient Details Name: Brittany [name] MRN: [id number] DOB: 1960-03-09 Today's Date: 11/07/2021   History of present illness 61 yo female from ALF in [place] with onset of aphasia. pt with L temporal seizure due to hyperglycemia. CT and MRI (-) PMH DM, HTN lupus, prior PCA CVA, seizures, colostomy COPD pancreatic CA   OT comments  Pt supine in bed and asleep upon entry.  Max cueing and pt awakens, agreeable to engage.  Mod assist for bed mobility due to lethargy, min assist for transfers using RW or hand held assist.  Pt with poor awareness to deficits and to wetness in mesh underwear, poor safety; impulsive throughout session and remains disoriented. She follow simple commands but requires frequent redirection.  Continue to recommend 24/7 support at dc. Perseverates on food once fully awake, asking for bacon, eggs, grits and pancakes.  Will follow acutely.    Recommendations for follow up therapy are one component of a multi-disciplinary discharge planning process, led by the attending physician.  Recommendations may be updated based on patient status, additional functional criteria and insurance authorization.    Follow Up Recommendations  Skilled nursing-short term rehab (<3 hours/day) (ALF if can provided level of care)    Assistance Recommended at Discharge Frequent or constant Supervision/Assistance  Patient can return home with the following  A little help with walking and/or transfers;A little help with bathing/dressing/bathroom;Direct supervision/assist for medications management;Assist for transportation;Direct supervision/assist for financial management   Equipment Recommendations  BSC/3in1;Other (comment) (RW)    Recommendations for Other Services      Precautions / Restrictions Precautions Precautions: Fall Precaution Comments: ostomy Restrictions Weight Bearing Restrictions: No       Mobility Bed Mobility Overal bed mobility: Needs  Assistance Bed Mobility: Supine to Sit     Supine to sit: Mod assist     General bed mobility comments: to initate, pt pulling on therapist to ascend trunk.  increased time and effort due to lethargy    Transfers Overall transfer level: Needs assistance Equipment used: 1 person hand held assist, Rolling walker (2 wheels) Transfers: Sit to/from Stand Sit to Stand: Min assist                 Balance Overall balance assessment: Needs assistance Sitting-balance support: No upper extremity supported, Feet supported Sitting balance-Leahy Scale: Fair     Standing balance support: Bilateral upper extremity supported, During functional activity, Single extremity supported Standing balance-Leahy Scale: Poor                             ADL either performed or assessed with clinical judgement   ADL Overall ADL's : Needs assistance/impaired             Lower Body Bathing: Minimal assistance;Sit to/from stand   Upper Body Dressing : Minimal assistance;Sitting Upper Body Dressing Details (indicate cue type and reason): cueing and min assist to thread R UE in gown Lower Body Dressing: Minimal assistance;Sit to/from stand Lower Body Dressing Details (indicate cue type and reason): able to manage socks, min assist to maintain balance in standing Toilet Transfer: Minimal assistance;Ambulation;Rolling walker (2 wheels) Toilet Transfer Details (indicate cue type and reason): for safety Toileting- Clothing Manipulation and Hygiene: Moderate assistance;Sit to/from stand Toileting - Clothing Manipulation Details (indicate cue type and reason): clothing management     Functional mobility during ADLs: Minimal assistance;Rolling walker (2 wheels)      Extremity/Trunk Assessment  Vision       Perception     Praxis      Cognition Arousal/Alertness: Awake/alert, Lethargic Behavior During Therapy: Flat affect, Impulsive Overall Cognitive Status:  Impaired/Different from baseline Area of Impairment: Attention, Memory, Following commands, Safety/judgement, Awareness, Problem solving, Orientation                 Orientation Level: Disoriented to, Time, Situation, Place Current Attention Level: Focused Memory: Decreased recall of precautions, Decreased short-term memory Following Commands: Follows one step commands consistently, Follows one step commands with increased time Safety/Judgement: Decreased awareness of deficits, Decreased awareness of safety Awareness: Intellectual Problem Solving: Slow processing, Decreased initiation, Difficulty sequencing, Requires verbal cues, Requires tactile cues General Comments: pt lethargic initally, awakens with max cueing and then engages with thearpist.  Incontient of bladder during session (reports "I need to pee" and urinates before getting to commode).  Poor safety awareness with using RW, impulsively standing mulitple times during session.        Exercises      Shoulder Instructions       General Comments      Pertinent Vitals/ Pain       Pain Assessment Pain Assessment: Faces Faces Pain Scale: No hurt  Home Living                                          Prior Functioning/Environment              Frequency  Min 2X/week        Progress Toward Goals  OT Goals(current goals can now be found in the care plan section)  Progress towards OT goals: Progressing toward goals  Acute Rehab OT Goals Patient Stated Goal: none stated OT Goal Formulation: Patient unable to participate in goal setting Time For Goal Achievement: 11/13/21 Potential to Achieve Goals: Good  Plan Discharge plan remains appropriate;Frequency remains appropriate    Co-evaluation                 AM-PAC OT "6 Clicks" Daily Activity     Outcome Measure   Help from another person eating meals?: A Little Help from another person taking care of personal grooming?: A  Little Help from another person toileting, which includes using toliet, bedpan, or urinal?: A Lot Help from another person bathing (including washing, rinsing, drying)?: A Little Help from another person to put on and taking off regular upper body clothing?: A Little Help from another person to put on and taking off regular lower body clothing?: A Little 6 Click Score: 17    End of Session Equipment Utilized During Treatment: Rolling walker (2 wheels)  OT Visit Diagnosis: Unsteadiness on feet (R26.81);Muscle weakness (generalized) (M62.81)   Activity Tolerance Patient tolerated treatment well   Patient Left in chair;with call bell/phone within reach;with chair alarm set;with nursing/sitter in room   Nurse Communication Mobility status        Time: 7209-4709 OT Time Calculation (min): 29 min  Charges: OT General Charges $OT Visit: 1 Visit OT Treatments $Self Care/Home Management : 23-37 mins  [place] Office [phone]   Delight Stare 11/07/2021, 11:46 AM

## 2021-11-07 NOTE — Progress Notes (Signed)
Speech Language Pathology Treatment: Cognitive-Linquistic  Patient Details Name: Brittany [name] MRN: [id number] DOB: 08-06-1960 Today's Date: 11/07/2021 Time: 1215-1232 SLP Time Calculation (min) (ACUTE ONLY): 17 min  Assessment / Plan / Recommendation Clinical Impression  Pt alert throughout encounter but considerably confused and is potentially seeing objects that are not present. She continues to present with verbal perseverations. This session focused on cognitive linguistic skills, including problem solving, attention, and increasing verbal output. The pt followed simple commands with 100% accuracy provided verbal cues. She accurately answered 3/4 simple problem solving tasks. Pt continues to be motivated by food. A functional activity was attempted with the [facility] food service menu, however, pt perseverated on one word phrases throughout activity making answers to questions not meaningful. Additionally, suspect potential visual deficits or difficulty/inability to read as pt reports issues reading menu. Pt's attention throughout session appeared improved as she selectively attended to all therapist tasks. ST will follow for cognitive goals.   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 Pt is s/p EEG with focal seizure left temporal region.      SLP Plan  Continue with current plan of care      Recommendations for follow up therapy are one component of a multi-disciplinary discharge planning process, led by the attending physician.  Recommendations may be updated based on patient status, additional functional criteria and insurance authorization.    Recommendations                   Oral Care Recommendations: Oral care BID Follow Up Recommendations: Skilled nursing-short term rehab (<3 hours/day) Assistance recommended at discharge: Frequent or constant  Supervision/Assistance SLP Visit Diagnosis: Cognitive communication deficit (R41.841) Attention and concentration deficit following: Other cerebrovascular disease;Cerebral infarction Plan: Continue with current plan of care           [facility] Student SLP   11/07/2021, 1:23 PM

## 2021-11-08 DIAGNOSIS — I639 Cerebral infarction, unspecified: Secondary | ICD-10-CM | POA: Diagnosis not present

## 2021-11-08 LAB — GLUCOSE, CAPILLARY
Glucose-Capillary: 154 mg/dL — ABNORMAL HIGH (ref 70–99)
Glucose-Capillary: 269 mg/dL — ABNORMAL HIGH (ref 70–99)
Glucose-Capillary: 230 mg/dL — ABNORMAL HIGH (ref 70–99)
Glucose-Capillary: 122 mg/dL — ABNORMAL HIGH (ref 70–99)
Glucose-Capillary: 133 mg/dL — ABNORMAL HIGH (ref 70–99)
Glucose-Capillary: 139 mg/dL — ABNORMAL HIGH (ref 70–99)

## 2021-11-08 MED ORDER — STERILE WATER FOR INJECTION IJ SOLN
INTRAMUSCULAR | Status: AC
  Filled 2021-11-08: qty 10

## 2021-11-08 NOTE — Progress Notes (Signed)
Patient repeatedly attempting to leave the bed/room, not easily reoriented.   Safety ensured, will continue to monitor.

## 2021-11-08 NOTE — Progress Notes (Signed)
Patient asking for snacks multiple times throughout the night, education was provided regarding the dietary restrictions.  Patient demanded food, or "I will leave the bed and find some".  Snacks provided, safety ensured.

## 2021-11-08 NOTE — Progress Notes (Addendum)
STROKE TEAM PROGRESS NOTE   SUBJECTIVE (INTERVAL HISTORY) [name] with no family at the bedside.  She is cooperative but becomes tearful at times.  She is ready to discharge back to [facility] pending their evaluation. Plan to discharge tomorrow, rolling walker ordered   OBJECTIVE Temp:  [98.3 F (36.8 C)-99.2 F (37.3 C)] 99.2 F (37.3 C) (09/27 1147) Pulse Rate:  [90-110] 100 (09/27 1147) Cardiac Rhythm: Normal sinus rhythm (09/27 0946) Resp:  [14-20] 14 (09/27 1147) BP: (107-124)/(73-94) 120/77 (09/27 1147) SpO2:  [95 %-99 %] 95 % (09/27 1147)  Recent Labs  Lab 11/07/21 2003 11/07/21 2327 11/08/21 0306 11/08/21 0816 11/08/21 1145  GLUCAP 305* 156* 154* 269* 230*   Recent Labs  Lab 11/02/21 0750 11/03/21 0232  NA 131* 137  K 5.1 4.6  CL 100 106  CO2 20* 21*  GLUCOSE 413* 263*  BUN 17 15  CREATININE 0.73 0.77  CALCIUM 8.8* 9.0   No results for input(s): "AST", "ALT", "ALKPHOS", "BILITOT", "PROT", "ALBUMIN" in the last 168 hours. Recent Labs  Lab 11/02/21 0750 11/03/21 0232  WBC 9.0 8.5  HGB 10.9* 10.9*  HCT 34.5* 33.1*  MCV 92.7 91.4  PLT 527* 493*   No results for input(s): "CKTOTAL", "CKMB", "CKMBINDEX", "TROPONINI" in the last 168 hours. No results for input(s): "LABPROT", "INR" in the last 72 hours.  No results for input(s): "COLORURINE", "LABSPEC", "PHURINE", "GLUCOSEU", "HGBUR", "BILIRUBINUR", "KETONESUR", "PROTEINUR", "UROBILINOGEN", "NITRITE", "LEUKOCYTESUR" in the last 72 hours.  Invalid input(s): "APPERANCEUR"      Component Value Date/Time   CHOL 99 10/29/2021 0048   TRIG 40 10/29/2021 0048   HDL 27 (L) 10/29/2021 0048   CHOLHDL 3.7 10/29/2021 0048   VLDL 8 10/29/2021 0048   LDLCALC 64 10/29/2021 0048   Lab Results  Component Value Date   HGBA1C 13.2 (H) 10/30/2021      Component Value Date/Time   LABOPIA NONE DETECTED 10/29/2021 1744   COCAINSCRNUR NONE DETECTED 10/29/2021 1744   COCAINSCRNUR NONE DETECTED  08/13/2021 1717   LABBENZ NONE DETECTED 10/29/2021 1744   AMPHETMU NONE DETECTED 10/29/2021 1744   THCU NONE DETECTED 10/29/2021 1744   LABBARB NONE DETECTED 10/29/2021 1744    No results for input(s): "ETH" in the last 168 hours.  I have personally reviewed the radiological images below and agree with the radiology interpretations.  Overnight EEG with video  Result Date: 10/30/2021 Lora Havens, MD     10/30/2021 10:21 AM Patient Name: Brittany [name] MRN: [id number] Epilepsy Attending: Lora Havens Referring Physician/Provider: Rosalin Hawking, MD Duration: 10/29/2021 1456 to 10/30/2021 1012 Patient history: 61 y.o. 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 EEG to evaluate for seizure Level of alertness: Awake, asleep AEDs during EEG study: Ativan, LCM Technical aspects: This EEG study was done with scalp electrodes positioned according to the 10-20 International system of electrode placement. Electrical activity was reviewed with band pass filter of 1-'70Hz'$ , sensitivity of 7 uV/mm, display speed of 92m/sec with a '60Hz'$  notched filter applied as appropriate. EEG data were recorded continuously and digitally stored.  Video monitoring was available and reviewed as appropriate. Description: The posterior dominant rhythm consists of 9 Hz activity of moderate voltage (25-35 uV) seen predominantly in posterior head regions, asymmetric ( left<right) and reactive to eye opening and eye closing. Sleep was characterized by vertex waves, sleep spindles (12 to 14  Hz), maximal frontocentral region. EEG also showed intermittent 3 to 5 Hz theta -delta slowing. Spikes were noted in left temporal region  Hyperventilation and photic stimulation were not performed.   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 There is also cortical dysfunction arising from left temporal region likely secondary to underlying structural abnormality. No seizures were seen throughout the recording. Lora Havens   ECHOCARDIOGRAM COMPLETE  Result Date: 10/29/2021    ECHOCARDIOGRAM REPORT   Patient Name:   Brittany [name] Date of Exam: 10/29/2021 Medical Rec #:  [id number]     Height:       60.0 in Accession #:    [id number]    Weight:       123.9 lb Date of Birth:  1960-06-25    BSA:          1.523 m Patient Age:    76 years      BP:           130/95 mmHg Patient Gender: F             HR:           86 bpm. Exam Location:  Inpatient Procedure: 2D Echo, Color Doppler and Cardiac Doppler Indications:    Stroke  History:        Patient has prior history of Echocardiogram examinations, most                 recent 11/06/2020. Risk Factors:Hypertension and Diabetes. Lupus.  Sonographer:    Clayton Lefort RDCS (AE) Referring Phys: [place]  1. Left ventricular ejection fraction, by estimation, is 55 to 60%. The left ventricle has normal function. The left ventricle has no regional wall motion abnormalities. There is mild asymmetric left ventricular hypertrophy of the basal segment. Left ventricular diastolic parameters are consistent with Grade I diastolic dysfunction (impaired relaxation).  2. Right ventricular systolic function is low normal. The right ventricular size is normal. Tricuspid regurgitation signal is inadequate for assessing PA pressure.  3. The mitral valve is abnormal. Trivial mitral valve regurgitation. Moderate mitral annular calcification.  4. The aortic valve is tricuspid. Aortic valve regurgitation is not visualized.  5. The inferior vena cava is normal in size with greater than 50% respiratory variability, suggesting right atrial pressure of 3 mmHg. Comparison(s): Prior images reviewed side by side. LVEF  remains normal at 55-60%. FINDINGS  Left Ventricle: Left ventricular ejection fraction, by estimation, is 55 to 60%. The left ventricle has normal function. The left ventricle has no regional wall motion abnormalities. The left ventricular internal cavity size was normal in size. There is  mild asymmetric left ventricular hypertrophy of the basal segment. Left ventricular diastolic parameters are consistent with Grade I diastolic dysfunction (impaired relaxation). Right Ventricle: The right ventricular size is normal. No increase in right ventricular wall thickness. Right ventricular systolic function is low normal. Tricuspid regurgitation signal is inadequate for assessing PA pressure. Left Atrium: Left atrial size was normal in size. Right Atrium: Right atrial size was normal in size. Pericardium: There is no evidence of pericardial effusion. Mitral Valve: The mitral valve is abnormal. There is mild thickening of the mitral valve leaflet(s). There is mild calcification of the mitral valve leaflet(s). Moderate mitral annular calcification. Trivial mitral valve regurgitation. Tricuspid Valve: The tricuspid valve is grossly normal. Tricuspid valve  regurgitation is trivial. Aortic Valve: The aortic valve is tricuspid. There is mild aortic valve annular calcification. Aortic valve regurgitation is not visualized. Aortic valve mean gradient measures 1.0 mmHg. Aortic valve peak gradient measures 2.5 mmHg. Aortic valve area, by  VTI measures 4.08 cm. Pulmonic Valve: The pulmonic valve was grossly normal. Pulmonic valve regurgitation is trivial. Aorta: The aortic root is normal in size and structure. Venous: The inferior vena cava is normal in size with greater than 50% respiratory variability, suggesting right atrial pressure of 3 mmHg. IAS/Shunts: No atrial level shunt detected by color flow Doppler.  LEFT VENTRICLE PLAX 2D LVIDd:         3.20 cm   Diastology LVIDs:         2.20 cm   LV e' medial:    6.09 cm/s LV PW:          1.10 cm   LV E/e' medial:  13.7 LV IVS:        1.20 cm   LV e' lateral:   5.98 cm/s LVOT diam:     2.30 cm   LV E/e' lateral: 14.0 LV SV:         47 LV SV Index:   31 LVOT Area:     4.15 cm  RIGHT VENTRICLE RV Basal diam:  2.40 cm RV S prime:     6.85 cm/s TAPSE (M-mode): 1.6 cm LEFT ATRIUM             Index        RIGHT ATRIUM          Index LA diam:        2.20 cm 1.44 cm/m   RA Area:     7.79 cm LA Vol (A2C):   12.7 ml 8.34 ml/m   RA Volume:   13.50 ml 8.86 ml/m LA Vol (A4C):   25.1 ml 16.48 ml/m LA Biplane Vol: 19.3 ml 12.67 ml/m  AORTIC VALVE AV Area (Vmax):    3.29 cm AV Area (Vmean):   3.18 cm AV Area (VTI):     4.08 cm AV Vmax:           78.40 cm/s AV Vmean:          53.100 cm/s AV VTI:            0.115 m AV Peak Grad:      2.5 mmHg AV Mean Grad:      1.0 mmHg LVOT Vmax:         62.00 cm/s LVOT Vmean:        40.600 cm/s LVOT VTI:          0.113 m LVOT/AV VTI ratio: 0.98  AORTA Ao Root diam: 3.30 cm Ao Asc diam:  3.20 cm MITRAL VALVE MV Area (PHT): 4.96 cm     SHUNTS MV Decel Time: 153 msec     Systemic VTI:  0.11 m MV E velocity: 83.70 cm/s   Systemic Diam: 2.30 cm MV A velocity: 119.00 cm/s MV E/A ratio:  0.70 Rozann Lesches MD Electronically signed by Rozann Lesches MD Signature Date/Time: 10/29/2021/3:08:29 PM    Final    MR BRAIN WO CONTRAST  Result Date: 10/29/2021 CLINICAL DATA:  Acute neuro deficit. EXAM: MRI HEAD WITHOUT CONTRAST TECHNIQUE: Multiplanar, multiecho pulse sequences of the brain and surrounding structures were obtained without intravenous contrast. COMPARISON:  CT head 10/28/2021 FINDINGS: Brain: Limited MRI. The patient was not able to hold still and was scanned in the  lateral decubitus position. After several sequences, the patient left the scanner and the study was terminated. Diffusion-weighted imaging is diagnostic with mild motion. No acute infarct identified Chronic infarct left cerebellum and left occipital lobe as noted on prior studies. Chronic  microvascular ischemic change also noted in the white matter. No hydrocephalus. IMPRESSION: Limited and incomplete study.  No acute infarct. Electronically Signed   By: Franchot Gallo M.D.   On: 10/29/2021 11:11   EEG adult  Result Date: 10/28/2021 Greta Doom, MD     10/28/2021  5:54 PM History: Concern for seizure Sedation: none Technique: This EEG was acquired with electrodes placed according to the International 10-20 electrode system (including Fp1, Fp2, F3, F4, C3, C4, P3, P4, O1, O2, T3, T4, T5, T6, A1, A2, Fz, Cz, Pz). The following electrodes were missing or displaced: none. Background: The background consists of intermixed alpha and beta activities. There is a well defined posterior dominant rhythm of 9 Hz that is better seen on the right than left. Sleep is not recorded. Photic stimulation: Physiologic driving is not performed EEG Abnormalities: 1) Assymetric PDR Clinical Interpretation: This EEG recorded evidence of a left posterior cortical dysfunction consistent with the patient's know parieto-occipital infarct. There was no seizure or seizure predisposition recorded on this study. Please note that lack of epileptiform activity on EEG does not preclude the possibility of epilepsy. Roland Rack, MD [facility] [phone] If 7pm- 7am, please page neurology on call as listed in [place].   DG Chest Portable 1 View  Result Date: 10/28/2021 CLINICAL DATA:  Altered level of consciousness EXAM: PORTABLE CHEST 1 VIEW COMPARISON:  09/21/2021 FINDINGS: Single frontal view of the chest demonstrates a stable cardiac silhouette. Chronic bibasilar interstitial prominence and linear areas of consolidation, right greater than left. No new airspace disease, effusion, or pneumothorax. No acute bony abnormalities. IMPRESSION: 1. Chronic bibasilar interstitial prominence with scattered bibasilar consolidation, which has been stable since the 06/25/2021 exam. Findings are likely sequela  of postinflammatory scarring or chronic aspiration pneumonia. 2. Otherwise unremarkable chest x-ray. Electronically Signed   By: Randa Ngo M.D.   On: 10/28/2021 15:02   CT ANGIO HEAD NECK W WO CM W PERF (CODE STROKE)  Result Date: 10/28/2021 CLINICAL DATA:  61 year old female code stroke presentation. Chronic infarcts. EXAM: CT ANGIOGRAPHY HEAD AND NECK TECHNIQUE: Multidetector CT imaging of the head and neck was performed using the standard protocol during bolus administration of intravenous contrast. Multiplanar CT image reconstructions and MIPs were obtained to evaluate the vascular anatomy. Carotid stenosis measurements (when applicable) are obtained utilizing NASCET criteria, using the distal internal carotid diameter as the denominator. CT Cerebral Perfusion also performed, with planned analysis by RAPID software, but the study is nondiagnostic due to patient motion. RADIATION DOSE REDUCTION: This exam was performed according to the departmental dose-optimization program which includes automated exposure control, adjustment of the mA and/or kV according to patient size and/or use of iterative reconstruction technique. CONTRAST:  130m OMNIPAQUE IOHEXOL 350 MG/ML SOLN COMPARISON:  Plain head CT 1148 hours today. FINDINGS: CT Perfusion: Nondiagnostic due to pronounced patient motion. CTA NECK Skeleton: Cervical spine degeneration. No acute osseous abnormality identified. Upper chest: Centrilobular emphysema. Mild upper lung atelectasis. No superior mediastinal lymphadenopathy. Other neck: No neck mass or lymphadenopathy identified. Aortic arch: Calcified aortic atherosclerosis. Aberrant origin of the right subclavian artery, 4 vessel arch configuration. Right carotid system: Mildly tortuous proximal right CCA. Right CCA and right ICA intermittent plaque with no significant stenosis to the skull  base. Left carotid system: Mildly tortuous proximal left CCA. Less pronounced left carotid plaque in the neck  with no stenosis to the skull base. Vertebral arteries: Aberrant origin proximal right subclavian artery with atherosclerosis but no significant stenosis. Right vertebral artery origin is normal. Right vertebral artery is non dominant and diminutive but patent to the skull base with no focal stenosis identified. Proximal left subclavian artery soft and calcified plaque without significant stenosis. Calcified plaque at the left vertebral artery origin but no significant stenosis. Tortuous left V1 segment. Dominant left vertebral artery is patent to the skull base with only mild stenosis. CTA HEAD Posterior circulation: Dominant left V4 segment. Patent PICA origins and no distal vertebral or vertebrobasilar junction stenosis. Patent basilar artery without stenosis. Patent SCA and PCA origins. Right posterior communicating artery is present. Smaller left posterior communicating artery is present. There is up to moderate irregularity of the left PCA with attenuated distal PCA branches, chronic infarct in that vascular territory. Right PCA branches are patent with only mild irregularity. Anterior circulation: Both ICA siphons are patent. Mild siphon plaque, no siphon stenosis. Normal posterior communicating artery origins. Patent carotid termini, MCA and ACA origins. Tortuous A1 segments. Anterior communicating artery and bilateral ACA branches are within normal limits. Right MCA M1 segment and bifurcation are patent with only mild tortuosity and irregularity. No right MCA branch occlusion. Left MCA M1 segment and bifurcation are patent with only mild irregularity. No left MCA branch occlusion is identified. Venous sinuses: Early contrast timing, not well evaluated. Anatomic variants: Dominant left vertebral artery. Aberrant origin of the right subclavian artery. Fetal type right PCA origin. Review of the MIP images confirms the above findings IMPRESSION: 1. Negative for emergent large vessel occlusion. Nondiagnostic CT  Perfusion due to patient motion. 2. Irregular and attenuated Left PCA branches compatible with chronic infarct in that territory. 3. Otherwise generally mild for age atherosclerosis in the head and neck. No other significant arterial stenosis identified. 4. More advanced Aortic Atherosclerosis (ICD10-I70.0). 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 5. Emphysema (ICD10-J43.9). Preliminary CTA results were discussed by telephone with Neurology Dr. Quinn Axe on 10/28/2021 at 1214 hours. Electronically Signed   By: Genevie Ann M.D.   On: 10/28/2021 12:35   CT HEAD CODE STROKE WO CONTRAST  Result Date: 10/28/2021 CLINICAL DATA:  Code stroke. 61 year old female with history of multiple prior infarcts. New aphasia. Possible seizure activity. EXAM: CT HEAD WITHOUT CONTRAST TECHNIQUE: Contiguous axial images were obtained from the base of the skull through the vertex without intravenous contrast. RADIATION DOSE REDUCTION: This exam was performed according to the departmental dose-optimization program which includes automated exposure control, adjustment of the mA and/or kV according to patient size and/or use of iterative reconstruction technique. COMPARISON:  Head CT 09/21/2021. FINDINGS: Brain: Multifocal chronic infarcts and encephalomalacia appears stable by CT since 09/21/2021. This includes involvement in the anterior right MCA territory, extensive left temporal and occipital lobe encephalomalacia, extensive left cerebellar encephalomalacia. Additional confluent bilateral cerebral white matter hypodensity including deep white matter capsule involvement. No acute intracranial hemorrhage identified. No midline shift, mass effect, or evidence of intracranial mass lesion. No ventriculomegaly. No cortically based acute infarct identified. Vascular: Calcified atherosclerosis at the skull base. No suspicious intracranial vascular hyperdensity. Skull: No acute osseous abnormality identified. Sinuses/Orbits:  Visualized paranasal sinuses and mastoids are stable and well aerated. Other: No acute orbit or scalp soft tissue finding. ASPECTS [facility] Stroke Program Early CT Score) Total score (0-10 with 10 being normal): 10,  chronic encephalomalacia. IMPRESSION: 1. Stable CT appearance of advanced ischemic disease since last month. 2. No acute cortically based infarct or acute intracranial hemorrhage identified. ASPECTS 10. 3. Study discussed by telephone with Dr. Quinn Axe on 10/28/2021 at 1155 hours. Electronically Signed   By: Genevie Ann M.D.   On: 10/28/2021 11:58     PHYSICAL EXAM  Temp:  [98.3 F (36.8 C)-99.2 F (37.3 C)] 99.2 F (37.3 C) (09/27 1147) Pulse Rate:  [90-110] 100 (09/27 1147) Resp:  [14-20] 14 (09/27 1147) BP: (107-124)/(73-94) 120/77 (09/27 1147) SpO2:  [95 %-99 %] 95 % (09/27 1147)  General - Well nourished, well developed, cooperative with exam today but tearful at times    Neuro - awake, alert, eyes open, perseverates on food but is able to answer some questions today.  Oriented to self and place, able to follow simple and two step commands. No gaze palsy, tracking bilaterally. No facial droop. Moving all extremities strong equally, Sensation intact to light touch, FNF intact bilaterally.   ASSESSMENT/PLAN [name] is a 61 y.o. 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  TNK given.  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 No seizures were seen throughout the recording. 2D Echo EF 55 to 60% LDL 64 HgbA1c 13.2 UDS negative SCDs for VTE prophylaxis aspirin 81 mg daily prior  to admission, now on aspirin 81 mg daily.  Increased Vimpat to 100 milligrams twice daily Ongoing aggressive stroke risk factor management Therapy recommendations: SNF back to [facility] Disposition: Pending  History of seizure 03/2021 admitted for confusion, possible seizure and possible COVID.  EEG showed LPD with brief ictal discharge.  Long-term EEG showed left temporal epileptogenicity, discharged on Keppra 500 Per pharmacy, patient did not refill Keppra  History of stroke 07/2019 admitted for nausea vomiting, headache and generalized weakness.  Found to have DKA.  MRI showed left inferior cerebellum infarct.  LDL 153, A1c 13.5.  Carotid Doppler negative.  EF 55 to 60%. 10/2020 admitted for altered mental status, slurred speech, dysphagia and aphasia with right-sided mild weakness.  CT showed left PCA infarct.  MRI showed early subacute left PCA infarct, old bilateral BG lacunar infarct, left cerebellum old infarct.  Carotid Doppler negative, MRA negative.  EF 65 to 70%.  Discharged on DAPT.  Agitation and combative ? 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 Continue Seroquel Back on restraints today  Psychiatry recs- depakote and zyprexa in addition to seroquel On depakote 250 bid plan to increase to 500 mg twice daily Zyprexa PRN  Diabetes, uncontrolled HgbA1c 13.2 goal < 7.0 Hyperglycemia on presentation, glucose 450 Uncontrolled Currently on Levemir BID 6 am/11hs CBG monitoring SSI Q4h now SSI with 6u Novolog with meals added DM education and close PCP follow up  Hypertension Home meds Cardizem 120 Stable Long term BP goal normotensive  Hyperlipidemia Home meds: Lipitor 40 LDL 64, goal < 70 Now on Lipitor 40 Continue statin at discharge  Tobacco abuse Current smoker Smoking cessation counseling will be provided Pt is willing to quit  Other Stroke Risk Factors   Other Active Problems Lower back pain Discoid lupus Partial pancreectomy due to low-grade  dysplasia  Hospital day # 11  Patient seen and examined by NP/APP with MD. MD to update note as needed.   Janine Ores, DNP, FNP-BC [facility] Pager: [phone] I have personally obtained history,examined this patient, reviewed notes, independently viewed imaging studies, participated in medical decision making and plan of care.ROS completed by me personally and pertinent positives fully documented  I have made any additions or clarifications directly to the above note. Agree with note above.    Antony Contras, MD Medical Director [facility] Stroke Center Pager: [phone] 11/08/2021 3:11 PM  To contact Stroke Continuity provider, please refer to [email or web address] After hours, contact General Neurology

## 2021-11-08 NOTE — Progress Notes (Signed)
Patient bed alarm going off, patient found out of bed.   Ostomy in hand stool everywhere. Patient confused, combative attempting to leave.   Patient was assisted to chair, cleaned and safely assisted back in bed.   Patient requesting snacks. PO intake offered,

## 2021-11-08 NOTE — Progress Notes (Signed)
Mobility Specialist: Progress Note   11/08/21 1610  Mobility  Activity Ambulated with assistance in hallway  Level of Assistance Contact guard assist, steadying assist  Assistive Device Front wheel walker  Distance Ambulated (ft) 250 ft  Activity Response Tolerated well  $Mobility charge 1 Mobility   Received pt in bed having no complaints and agreeable to mobility. Pt was asymptomatic throughout ambulation and returned to room w/o fault. Left sitting EOB w/ call bell in reach and all needs met.    Mobility Specialist Mobility Specialist [address]: [phone]  

## 2021-11-08 NOTE — Progress Notes (Signed)
Patient was found attempting to leave her bed while playing with her ostomy, removing pouch and playing with stool.

## 2021-11-09 DIAGNOSIS — I639 Cerebral infarction, unspecified: Secondary | ICD-10-CM | POA: Diagnosis not present

## 2021-11-09 LAB — GLUCOSE, CAPILLARY
Glucose-Capillary: 118 mg/dL — ABNORMAL HIGH (ref 70–99)
Glucose-Capillary: 129 mg/dL — ABNORMAL HIGH (ref 70–99)
Glucose-Capillary: 171 mg/dL — ABNORMAL HIGH (ref 70–99)
Glucose-Capillary: 200 mg/dL — ABNORMAL HIGH (ref 70–99)
Glucose-Capillary: 62 mg/dL — ABNORMAL LOW (ref 70–99)
Glucose-Capillary: 87 mg/dL (ref 70–99)
Glucose-Capillary: 90 mg/dL (ref 70–99)

## 2021-11-09 MED ORDER — DIVALPROEX SODIUM 500 MG PO DR TAB: 500.0000 mg | DELAYED_RELEASE_TABLET | Freq: Two times a day (BID) | ORAL | 1 refills | Status: DC

## 2021-11-09 NOTE — TOC Transition Note (Signed)
Transition of Care [facility]) - CM/SW Discharge Note   Patient Details  Name: SWAYZE KOZUCH MRN: [id number] Date of Birth: October 09, 1960  Transition of Care [facility]) CM/SW Contact:  Geralynn Ochs, LCSW Phone Number: 11/09/2021, 3:38 PM   Clinical Narrative:   CSW confirmed with [facility] that they received information for patient to return. RN to send extra colostomy supplies with patient. Transport arranged with [facility]. Patient ready to return home.  Nurse to call report to [phone].    Final next level of care: Assisted Living Barriers to Discharge: Barriers Resolved   Patient Goals and CMS Choice Patient states their goals for this hospitalization and ongoing recovery are:: patient unable to participate in goal setting, not oriented CMS Medicare.gov Compare Post Acute Care list provided to:: Other (Comment Required) (DSS)    Discharge Placement              Patient chooses bed at:  [facility]) Patient to be transferred to facility by: [facility]   Patient and family notified of of transfer: 11/09/21  Discharge Plan and Services                                     Social Determinants of Health (SDOH) Interventions     Readmission Risk Interventions    09/22/2021    1:24 PM 03/06/2021    9:13 AM 02/16/2021    2:45 PM  Readmission Risk Prevention Plan  Transportation Screening Complete Complete Complete  Medication Review ([place]) Complete Complete Complete  PCP or Specialist appointment within 3-5 days of discharge Complete Complete   HRI or Home Care Consult Complete    SW Recovery Care/Counseling Consult Complete  Complete  Palliative Care Screening Not Applicable Not Applicable Not [place] Complete Not Applicable Not Applicable

## 2021-11-09 NOTE — Progress Notes (Signed)
Mobility Specialist: Progress Note   11/09/21 1440  Mobility  Activity Refused mobility   Pt sleeping upon entering room and difficult to wake. Pt refused mobility, no reason specified. Will f/u as able.   [facility] Haider Hornaday Mobility Specialist Mobility Specialist [address]: [phone]

## 2021-11-09 NOTE — TOC Progression Note (Signed)
Transition of Care [facility]) - Progression Note    Patient Details  Name: Brittany [name] MRN: [id number] Date of Birth: June 11, 1960  Transition of Care [facility]) CM/SW [place], [place] Phone Number: 11/09/2021, 2:22 PM  Clinical Narrative:   CSW asked RN about assessment, and RN reported that no one came to assess the patient. CSW contacted [place] to ask about assessment, and [place] went to [facility], did not realize patient was at [facility] [place] will not come to [facility] for in person assessment, asking to do a FaceTime assessment. CSW coordinated with RN to completed FaceTime assessment, confirmed that patient is able to return to ALF. ALF asking for order for rolling walker as well as extra ostomy supplies. RN assisted with obtaining correct ostomy supplies, CSW asked MD for RW order. CSW to send updates to [facility] tomorrow for patient to return. CSW to follow.    Expected Discharge Plan: Assisted Living Barriers to Discharge: Other (must enter comment) (ALF refusing to take back without a new assessment)  Expected Discharge Plan and Services Expected Discharge Plan: Assisted Living       Living arrangements for the past 2 months: [place], Single Family Home Expected Discharge Date: 11/09/21                                     Social Determinants of Health (SDOH) Interventions    Readmission Risk Interventions    09/22/2021    1:24 PM 03/06/2021    9:13 AM 02/16/2021    2:45 PM  Readmission Risk Prevention Plan  Transportation Screening Complete Complete Complete  Medication Review Press photographer) Complete Complete Complete  PCP or Specialist appointment within 3-5 days of discharge Complete Complete   HRI or [place] Complete    SW Recovery Care/Counseling Consult Complete  Complete  Palliative Care Screening Not Applicable Not Applicable Not [place] Complete Not Applicable  Not Applicable

## 2021-11-09 NOTE — TOC Progression Note (Signed)
Transition of Care [facility]) - Progression Note    Patient Details  Name: Brittany [name] MRN: [id number] Date of Birth: [birth year]  Transition of Care [facility]) CM/SW [place], [place] Phone Number: 11/09/2021, 2:21 PM  Clinical Narrative:   CSW contacted [facility], they will be conducting assessment between 3 and 5 today. CSW updated RN and MD. CSW to follow.    Expected Discharge Plan: Assisted Living Barriers to Discharge: Other (must enter comment) (ALF refusing to take back without a new assessment)  Expected Discharge Plan and Services Expected Discharge Plan: Assisted Living       Living arrangements for the past 2 months: [place], Single Family Home Expected Discharge Date: 11/09/21                                     Social Determinants of Health (SDOH) Interventions    Readmission Risk Interventions    09/22/2021    1:24 PM 03/06/2021    9:13 AM 02/16/2021    2:45 PM  Readmission Risk Prevention Plan  Transportation Screening Complete Complete Complete  Medication Review Press photographer) Complete Complete Complete  PCP or Specialist appointment within 3-5 days of discharge Complete Complete   HRI or [place] Complete    SW Recovery Care/Counseling Consult Complete  Complete  Palliative Care Screening Not Applicable Not Applicable Not [place] Complete Not Applicable Not Applicable
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
## 2021-11-09 NOTE — Inpatient Diabetes Management (Signed)
Inpatient Diabetes Program Recommendations  AACE/ADA: New Consensus Statement on Inpatient Glycemic Control (2015)  Target Ranges:  Prepandial:   less than 140 mg/dL      Peak postprandial:   less than 180 mg/dL (1-2 hours)      Critically ill patients:  140 - 180 mg/dL   Lab Results  Component Value Date   GLUCAP 87 11/09/2021   HGBA1C 13.2 (H) 10/30/2021    Review of Glycemic Control  Latest Reference Range & Units 11/09/21 00:01 11/09/21 03:36 11/09/21 04:02 11/09/21 08:22  Glucose-Capillary 70 - 99 mg/dL 90 62 (L) 118 (H) 87   Diabetes history: Type 1 DM Current orders for Inpatient glycemic control: Novolog 0-15 units Q4H, Novolog 6 units TID, Levemir 6 units QD, Levemir 11 units QHS  Inpatient Diabetes Program Recommendations:    Noted hypoglycemia of 62 mg/dL following correction doses being too close together. Of note, correction should be given within one hour of previous CBG and ensure administration of correction is four hours apart.  Could consider reducing correction to Novolog 0-9 units Q4H.  Thanks, Bronson Curb, MSN, RNC-OB Diabetes Coordinator [phone] (8a-5p)

## 2021-11-09 NOTE — Progress Notes (Signed)
Follow-up CBG: Time:     0402       CBG Result: 118  Follow up CBG obtained, patient is asymptomatic, will continue to monitor.

## 2021-11-09 NOTE — Progress Notes (Signed)
Hypoglycemic Event  CBG: 62  Treatment: 4 oz orange juice and 4 oz apple juice  Symptoms: no symptoms  Follow-up CBG: Time:  CBG Result:  Possible Reasons for Event: unknown  Comments/MD notified:  Primary RN, Laurance Flatten notified and aware.  This RN treated BG reported by Jubert, NT.  Pt awake and alert at this time.  Asymptomatic.     Tressie Stalker

## 2021-11-09 NOTE — Progress Notes (Signed)
Patient found out of the bed, when asked to get back in the bed she proceeded to urinate on the floor.    Patient assisted back in the bed safely, and assisted with personal hygiene.

## 2021-11-10 LAB — SARS CORONAVIRUS 2 (TAT 6-24 HRS): SARS Coronavirus 2: NEGATIVE

## 2021-11-15 ENCOUNTER — Non-Acute Institutional Stay: Payer: Medicare Other | Admitting: Student

## 2021-11-15 ENCOUNTER — Telehealth: Payer: Self-pay | Admitting: *Deleted

## 2021-11-15 DIAGNOSIS — R531 Weakness: Secondary | ICD-10-CM

## 2021-11-15 DIAGNOSIS — Z7189 Other specified counseling: Secondary | ICD-10-CM

## 2021-11-15 DIAGNOSIS — Z515 Encounter for palliative care: Secondary | ICD-10-CM

## 2021-11-15 DIAGNOSIS — F419 Anxiety disorder, unspecified: Secondary | ICD-10-CM
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
## 2021-11-15 NOTE — Telephone Encounter (Signed)
Received an email from Palliative care NP [facility] requesting me to contact [facility] to see when they will be out to visit with patient. She is to be receiving Home health RN and therapy from them. Patient is in need of colostomy supplies as it is needing to be changed multiple times a day.  I spoke with [facility], and their RN is scheduled to come out and see patient tomorrow. Made them aware of above noted information.

## 2021-11-19 ENCOUNTER — Emergency Department
Admission: EM | Admit: 2021-11-19 | Discharge: 2021-11-19 | Disposition: A | Discharge status: Home / Self Care | Payer: Medicare Other | Attending: Emergency Medicine | Admitting: Emergency Medicine

## 2021-11-19 ENCOUNTER — Encounter: Payer: Self-pay | Admitting: Intensive Care

## 2021-11-19 ENCOUNTER — Other Ambulatory Visit: Payer: Self-pay

## 2021-11-19 DIAGNOSIS — E119 Type 2 diabetes mellitus without complications: Secondary | ICD-10-CM | POA: Insufficient documentation

## 2021-11-19 DIAGNOSIS — Z933 Colostomy status: Secondary | ICD-10-CM | POA: Diagnosis not present

## 2021-11-19 DIAGNOSIS — K9403 Colostomy malfunction: Secondary | ICD-10-CM | POA: Insufficient documentation

## 2021-11-19 DIAGNOSIS — Z433 Encounter for attention to colostomy: Secondary | ICD-10-CM

## 2021-11-19 NOTE — ED Notes (Signed)
See triage note. Pt to ED from [facility] via EMS stating that colostomy bags were not ordered in time and needs ED to supply these to her.

## 2021-11-19 NOTE — ED Notes (Signed)
[facility]. They confirmed that colostomy bags were ordered but have not arrived yet. They said pt will need EMS transport to return to facility.

## 2021-11-19 NOTE — ED Notes (Signed)
Colostomy bag replaced by Baxter Flattery, RN. Will arrange EMS transport back to [facility].

## 2021-11-19 NOTE — ED Triage Notes (Signed)
Patient arrived from [facility] for colostomy bag. Staff reports home health did not order bags in time. Patient arrived without a colostomy bag on.

## 2021-11-19 NOTE — ED Provider Notes (Signed)
   [facility] Provider Note    Event Date/Time   First MD Initiated Contact with Patient 11/19/21 1141     (approximate)   History   colostomy bag replacement    HPI  Brittany [name] is a 61 y.o. female with history of diabetes, CVA, metabolic encephalopathy, colostomy with colectomy last January presents to the ER without colostomy bag. She states that the bags get loose and stool runs down her stomach so she just pulls it all the way off. Her facility didn't have any to replace the one she took off so they sent her here. She denies medical complaints.     Physical Exam   Triage Vital Signs: ED Triage Vitals  Enc Vitals Group     BP 11/19/21 0950 (!) 132/100     Pulse Rate 11/19/21 0950 92     Resp 11/19/21 0950 16     Temp 11/19/21 0950 97.7 F (36.5 C)     Temp Source 11/19/21 0950 Oral     SpO2 11/19/21 0950 93 %     Weight 11/19/21 0946 130 lb (59 kg)     Height 11/19/21 0946 5' (1.524 m)     Head Circumference --      Peak Flow --      Pain Score 11/19/21 0946 0     Pain Loc --      Pain Edu? --      Excl. in [place]? --     Most recent vital signs: Vitals:   11/19/21 0950 11/19/21 1455  BP: (!) 132/100 (!) 120/92  Pulse: 92 88  Resp: 16 16  Temp: 97.7 F (36.5 C)   SpO2: 93% 92%     General: Awake, no distress.  CV:  Good peripheral perfusion.  Resp:  Normal effort.  Abd:  No distention.  Other:  Healthy appearing stoma. Surrounding skin intact.   ED Results / Procedures / Treatments   Labs (all labs ordered are listed, but only abnormal results are displayed) Labs Reviewed - No data to display   EKG  Not indicated.   RADIOLOGY Not indicated.   PROCEDURES:  Critical Care performed: No  Procedures   MEDICATIONS ORDERED IN ED: Medications - No data to display   IMPRESSION / MDM / [place] / ED COURSE  I reviewed the triage vital signs and the nursing notes.                               Differential diagnosis includes, but is not limited to, stoma care.  Patient's presentation is most consistent with acute, uncomplicated illness.  60 year old female presenting to the emergency department for replacement of her colostomy bag.  She has no medical complaints today.  Her abdomen is soft.  Stoma appears healthy and surrounding skin is without sign or concern of infection.  Colostomy bag was replaced and the patient will be sent back to her facility.     FINAL CLINICAL IMPRESSION(S) / ED DIAGNOSES   Final diagnoses:  Colostomy care [facility])     Rx / DC Orders   ED Discharge Orders     None        Note:  This document was prepared using Dragon voice recognition software and may include unintentional dictation errors.   Victorino Dike, FNP 11/19/21 1517    Duffy Bruce, MD 11/21/21 [phone]

## 2021-11-19 NOTE — ED Notes (Signed)
ED secretary was called re: ems transport. She told this RN that EMS already told secretary they will not bring pt back and we need to call daughter. Called daughter who states she does not have a car or other means to transport pt. [facility] already told this RN they cannot provide transport. Will call ED secretary back.

## 2021-12-10 ENCOUNTER — Emergency Department: Payer: Medicare Other

## 2021-12-10 ENCOUNTER — Other Ambulatory Visit: Payer: Self-pay

## 2021-12-10 ENCOUNTER — Encounter: Payer: Self-pay | Admitting: Emergency Medicine

## 2021-12-10 ENCOUNTER — Emergency Department
Admission: EM | Admit: 2021-12-10 | Discharge: 2021-12-10 | Disposition: A | Discharge status: Home / Self Care | Payer: Medicare Other | Attending: Emergency Medicine | Admitting: Emergency Medicine

## 2021-12-10 DIAGNOSIS — Z933 Colostomy status: Secondary | ICD-10-CM | POA: Diagnosis not present

## 2021-12-10 DIAGNOSIS — M25561 Pain in right knee: Secondary | ICD-10-CM | POA: Insufficient documentation

## 2021-12-10 DIAGNOSIS — E11649 Type 2 diabetes mellitus with hypoglycemia without coma: Secondary | ICD-10-CM | POA: Insufficient documentation

## 2021-12-10 DIAGNOSIS — D72829 Elevated white blood cell count, unspecified: Secondary | ICD-10-CM | POA: Diagnosis not present

## 2021-12-10 DIAGNOSIS — N3000 Acute cystitis without hematuria: Secondary | ICD-10-CM | POA: Insufficient documentation

## 2021-12-10 DIAGNOSIS — W19XXXA Unspecified fall, initial encounter: Secondary | ICD-10-CM | POA: Diagnosis not present

## 2021-12-10 DIAGNOSIS — R519 Headache, unspecified: Secondary | ICD-10-CM | POA: Insufficient documentation

## 2021-12-10 DIAGNOSIS — E162 Hypoglycemia, unspecified: Secondary | ICD-10-CM

## 2021-12-10 DIAGNOSIS — M25511 Pain in right shoulder: Secondary | ICD-10-CM | POA: Diagnosis not present

## 2021-12-10 LAB — URINALYSIS, ROUTINE W REFLEX MICROSCOPIC
Bilirubin Urine: NEGATIVE
Glucose, UA: >=500 mg/dL — AB
Hgb urine dipstick: NEGATIVE
Ketones, ur: NEGATIVE mg/dL
Nitrite: NEGATIVE
Protein, ur: NEGATIVE mg/dL
Specific Gravity, Urine: 1.009 (ref 1.005–1.030)
pH: 5 (ref 5.0–8.0)

## 2021-12-10 LAB — CBC
HCT: 37.1 % (ref 36.0–46.0)
Hemoglobin: 11.4 g/dL — ABNORMAL LOW (ref 12.0–15.0)
MCH: 27.5 pg (ref 26.0–34.0)
MCHC: 30.7 g/dL (ref 30.0–36.0)
MCV: 89.6 fL (ref 80.0–100.0)
Platelets: 239 10*3/uL (ref 150–400)
RBC: 4.14 MIL/uL (ref 3.87–5.11)
RDW: 15.4 % (ref 11.5–15.5)
WBC: 14.4 10*3/uL — ABNORMAL HIGH (ref 4.0–10.5)
nRBC: 0 % (ref 0.0–0.2)

## 2021-12-10 LAB — BASIC METABOLIC PANEL
Anion gap: 7 (ref 5–15)
BUN: 19 mg/dL (ref 8–23)
CO2: 19 mmol/L — ABNORMAL LOW (ref 22–32)
Calcium: 8.5 mg/dL — ABNORMAL LOW (ref 8.9–10.3)
Chloride: 107 mmol/L (ref 98–111)
Creatinine, Ser: 0.8 mg/dL (ref 0.44–1.00)
GFR, Estimated: >60 mL/min (ref >60)
Glucose, Bld: 310 mg/dL — ABNORMAL HIGH (ref 70–99)
Potassium: 4.5 mmol/L (ref 3.5–5.1)
Sodium: 133 mmol/L — ABNORMAL LOW (ref 135–145)

## 2021-12-10 LAB — CBG MONITORING, ED
Glucose-Capillary: 211 mg/dL — ABNORMAL HIGH (ref 70–99)
Glucose-Capillary: 217 mg/dL — ABNORMAL HIGH (ref 70–99)
Glucose-Capillary: 156 mg/dL — ABNORMAL HIGH (ref 70–99)
Glucose-Capillary: 188 mg/dL — ABNORMAL HIGH (ref 70–99)

## 2021-12-10 LAB — VALPROIC ACID LEVEL: Valproic Acid Lvl: 60 ug/mL (ref 50.0–100.0)

## 2021-12-10 LAB — HEPATIC FUNCTION PANEL
ALT: 15 U/L (ref 0–44)
AST: 35 U/L (ref 15–41)
Albumin: 2.5 g/dL — ABNORMAL LOW (ref 3.5–5.0)
Alkaline Phosphatase: 88 U/L (ref 38–126)
Bilirubin, Direct: <0.1 mg/dL (ref 0.0–0.2)
Total Bilirubin: 0.5 mg/dL (ref 0.3–1.2)
Total Protein: 7.4 g/dL (ref 6.5–8.1)

## 2021-12-10 LAB — TROPONIN I (HIGH SENSITIVITY)
Troponin I (High Sensitivity): 8 ng/L (ref <18)
Troponin I (High Sensitivity): 14 ng/L (ref <18)

## 2021-12-10 LAB — CK: Total CK: 674 U/L — ABNORMAL HIGH (ref 38–234)

## 2021-12-10 MED ORDER — INSULIN ASPART 100 UNIT/ML IJ SOLN
3.0000 [IU] | Freq: Once | INTRAMUSCULAR | Status: AC
  Administered 2021-12-10: 3 [IU] via SUBCUTANEOUS
  Filled 2021-12-10: qty 1

## 2021-12-10 MED ORDER — ACETAMINOPHEN 500 MG PO TABS: 1000.0000 mg | ORAL_TABLET | Freq: Once | ORAL | Status: DC

## 2021-12-10 MED ORDER — FOSFOMYCIN TROMETHAMINE 3 G PO PACK
3.0000 g | PACK | Freq: Once | ORAL | Status: AC
  Administered 2021-12-10: 3 g via ORAL
  Filled 2021-12-10: qty 3

## 2021-12-10 NOTE — Discharge Instructions (Addendum)
I would go back to the metformin 500 twice daily until further discussions had with the doctor who recently increased it.  If they want to go back up on the metformin they may need to come down on some of her insulins given she had hypoglycemia today that led to her being found down on the ground.  Her sugars here have been in the 200s over the 6-hour so has not needed any additional medication since getting here and there is no signs of any injuries to her head or any fractures.  There was a possibility of a UTI and she was given some fosfomycin for this.

## 2021-12-10 NOTE — ED Notes (Signed)
Call for tranport back to [facility]

## 2021-12-10 NOTE — ED Triage Notes (Signed)
Pt in via EMS with c/o low glucose. Pt was cbg 50 upon EMS arrival and now at 168 Pt was given 400 D10 and 2 squirts of glucagon. Pt is weak. 103/68, HR 70, 98% RA, #20g to right hand

## 2021-12-10 NOTE — ED Provider Notes (Signed)
Patient doing well.  She has eaten.  Her sugar is doing better.  If her last CBG is not too high or too low we will plan on discharging her.   Nena Polio, MD 12/10/21 [phone]

## 2021-12-10 NOTE — ED Provider Notes (Addendum)
[facility] Provider Note    Event Date/Time   First MD Initiated Contact with Patient 12/10/21 1117     (approximate)   History   Hypoglycemia   HPI  Brittany [name] is a 61 y.o. female with diabetes, CVA, colostomy who comes in with concerns for low blood sugar.  Patient reports having a low sugar.  Patient's sugar was 50 with EMS patient was given 400 of D10 and 2 squirts of glucagon.  I reviewed the note where patient was seen on 11/09/2021 for left temporal seizure in the setting of hyperglycemia.    Patient reports that she thinks that she had a fall.  She is not sure of the medications that she is on.  She is alert and oriented x2.  She reports some right knee pain and some right shoulder pain.  She denies any chest pain shortness of breath abdominal pain or other concerns.  Physical Exam   Triage Vital Signs: ED Triage Vitals  Enc Vitals Group     BP 12/10/21 0813 114/72     Pulse Rate 12/10/21 0813 75     Resp 12/10/21 0813 18     Temp 12/10/21 0813 (!) 97.2 F (36.2 C)     Temp Source 12/10/21 0813 Oral     SpO2 12/10/21 0813 99 %     Weight 12/10/21 0810 130 lb 1.1 oz (59 kg)     Height 12/10/21 0810 5' (1.524 m)     Head Circumference --      Peak Flow --      Pain Score 12/10/21 0810 3     Pain Loc --      Pain Edu? --      Excl. in [place]? --     Most recent vital signs: Vitals:   12/10/21 0813  BP: 114/72  Pulse: 75  Resp: 18  Temp: (!) 97.2 F (36.2 C)  SpO2: 99%     General: Awake, no distress.  CV:  Good peripheral perfusion.  Resp:  Normal effort.  Abd:  No distention.  Patient has positive colostomy bag but is nontender in the abdomen. Other:  Able to lift both legs up off the bed but she reports some right knee tenderness and right shoulder tenderness.   ED Results / Procedures / Treatments   Labs (all labs ordered are listed, but only abnormal results are displayed) Labs Reviewed  BASIC METABOLIC PANEL -  Abnormal; Notable for the following components:      Result Value   Sodium 133 (*)    CO2 19 (*)    Glucose, Bld 310 (*)    Calcium 8.5 (*)    All other components within normal limits  CBC - Abnormal; Notable for the following components:   WBC 14.4 (*)    Hemoglobin 11.4 (*)    All other components within normal limits  CBG MONITORING, ED - Abnormal; Notable for the following components:   Glucose-Capillary 211 (*)    All other components within normal limits  URINALYSIS, ROUTINE W REFLEX MICROSCOPIC     EKG  My interpretation of EKG:  Normal sinus rate of 70 without any ST elevation or T wave inversions except for aVL, normal intervals  RADIOLOGY I have reviewed the xray personally and interpreted and she is got some right greater than left lung base opacities which are stable from prior  PROCEDURES:  Critical Care performed: No  .1-3 Lead EKG Interpretation  Performed by:  Vanessa Walkerville, MD Authorized by: Vanessa , MD     Interpretation: normal     ECG rate:  70   ECG rate assessment: normal     Rhythm: sinus rhythm     Ectopy: none     Conduction: normal      MEDICATIONS ORDERED IN ED: Medications - No data to display   IMPRESSION / MDM / [place] / ED COURSE  I reviewed the triage vital signs and the nursing notes.   Patient's presentation is most consistent with acute presentation with potential threat to life or bodily function.  Patient comes in with hypoglycemia leading to a fall.  Unclear patient's downtime will get labs to evaluate for dehydration, AKI, CT imaging evaluate for intracranial hemorrhage, x-rays evaluate for fractures  I was able to call the facility and discussed with the CNA there who reported that they found her unresponsive on the ground at 6:30 AM and they tried to wake her up and they noted that her sugar was in the 50s they gave her some glucose but it was still in the 50s so they called EMS.  They did note that  they recently went up on her metformin from 500 twice daily to 1000 twice daily just a few days ago and since then her sugars have been running in the low 100s.  She is also on Levemir 10 units and NovoLog 5 units with meals.  They do not make any adjustments to this.  Patient does have dementia at baseline and normally walks with a walker and they did report that only being oriented x2 sounds like her baseline.  CBC shows elevated white count.  Hemoglobin is stable.  Her BMP shows glucose now of 310.  Chest x-ray stable from prior. Shoulder x-ray negative Knee x-ray negative CT cervical shows some emphysema CT head shows stable prior stroke  Patient's was declining COVID test.  Still need a urine evaluate for UTI.  Will get repeat troponin to make sure there was no cardiac cause of syncope but I suspect patient most likely got hypoglycemic and had a fall related to that.  If sugars remain stable I suspect that she can be discharged home given according to facility it sound like she is acting at her baseline self.  Patient will be handed off to oncoming team pending troponin and urine and repeat glucose.  I reviewed patient's note from back in 2023 where neurologically she was oriented towards self and place.  Patient seems to be at her baseline self she is moving all extremities well.  She denies feeling confused and denies any other concerns except for the shoulder pain from the fall which I will give some Tylenol for.  I did attempt to call patient's daughter without any success.  We will have her go back to her 500 twice daily metformin until they can discuss further with the facility there to decide if they want to go back up to the thousand they might want to come down on her insulin first to prevent these hypoglycemic episodes  Given patient's elevated white count with possible UTI will send for urine culture will give dose of fosfomycin Patient is requesting something to eat a looks like she  is on 5 units with food we will do 3 units for now.  The nurse is going to confirm the medication changes that she recently had and the meds that she recently had and patient will be handed off  to oncoming team pending recheck sugar after p.o. challenge and most likely discharge home  Pt has been up and ambulatory   The patient is on the cardiac monitor to evaluate for evidence of arrhythmia and/or significant heart rate changes.      FINAL CLINICAL IMPRESSION(S) / ED DIAGNOSES   Final diagnoses:  Hypoglycemia  Fall, initial encounter  Acute cystitis without hematuria     Rx / DC Orders   ED Discharge Orders     None        Note:  This document was prepared using Dragon voice recognition software and may include unintentional dictation errors.   Vanessa Jerome, MD 12/10/21 1351    Vanessa [place]fields, MD 12/10/21 1353    Vanessa Tusayan, MD 12/10/21 1513    Vanessa [place], MD 12/10/21 1519    Vanessa Nixon, MD 12/10/21 [phone]

## 2021-12-10 NOTE — ED Notes (Signed)
Pt refusing COVID swab, provider notified.

## 2021-12-10 NOTE — ED Triage Notes (Signed)
Pt reports her "sugar dropped" and her back hurts.

## 2021-12-11 LAB — URINE CULTURE: Culture: <10000 — AB

## 2021-12-20 ENCOUNTER — Non-Acute Institutional Stay: Payer: Medicare Other | Admitting: Student

## 2021-12-20 DIAGNOSIS — Z933 Colostomy status: Secondary | ICD-10-CM

## 2021-12-20 DIAGNOSIS — Z515 Encounter for palliative care: Secondary | ICD-10-CM

## 2021-12-20 DIAGNOSIS — F419 Anxiety disorder, unspecified: Secondary | ICD-10-CM

## 2021-12-20 DIAGNOSIS — M79604 Pain in right leg: Secondary | ICD-10-CM

## 2021-12-25 NOTE — Progress Notes (Signed)
[facility] Consult Note Telephone: [phone]  Fax: [phone]    Date of encounter: 12/20/21 11:46 AM PATIENT NAME: Brittany [name] [address] [place] [address]   [phone] (home)  DOB: Jul 24, 1960 MRN: [id number] PRIMARY CARE PROVIDER:    [name], NP,  [place] [address] [phone]  REFERRING PROVIDER:   [name], NP [address] [place],  [place] [address] [phone]  RESPONSIBLE PARTY:    Contact Information     Name Relation Home Work Mobile   Brittany [name] Daughter [phone]  [phone]   [facility] Mother   [phone]   Brittany [name] Sister   [phone]   Brittany [name] Sister   [phone]   Brittany [name] ([place]) Other  [phone]        I met face to face with patient in the facility. Palliative Care was asked to follow this patient by consultation request of  Brittany Ripple, NP to address advance care planning and complex medical decision making. This is a follow up visit.                                   ASSESSMENT AND PLAN / RECOMMENDATIONS:   Advance Care Planning/Goals of Care: Goals include to maximize quality of life and symptom management. Patient/health care surrogate gave his/her permission to discuss. Our advance care planning conversation included a discussion about:    The value and importance of advance care planning  Experiences with loved ones who have been seriously ill or have died  Exploration of personal, cultural or spiritual beliefs that might influence medical decisions  Exploration of goals of care in the event of a sudden injury or illness  CODE STATUS: Full Code  Education provided on Palliative Medicine. Attempted to review MOST form, goals of care. Patient is tearful throughout visit due to having to stay at facility. She reports strained family dynamics. She does express wanting  CPR, hospitalization as needed.   Symptom Management/Plan:  Anxiety, depression-patient with labile mood. Continue mirtazapine QHS. She expresses displeasure with being at facility. Recommend counseling, psychiatry involvement.   Colostomy-patient is pulling off ostomy bag less. Facility report patient still sometimes running out of supplies. She has been sent out to ED to have replaced due to lack of supplies. She is being followed by [facility] with [facility].  Pain-c/o right leg pain. Recommend acetaminophen 650 mg every 6 hours PRN pain; order written.  Follow up Palliative Care Visit: Palliative care will continue to follow for complex medical decision making, advance care planning, and clarification of goals. Return in 6-8 weeks or prn.   This visit was coded based on medical decision making (MDM).  PPS: 50%  HOSPICE ELIGIBILITY/DIAGNOSIS: TBD  Chief Complaint: Palliative Medicine follow up visit.   HISTORY OF PRESENT ILLNESS:  Brittany [name] is a 61 y.o. year old female  with CVA, COPD, seizure disorder, T2DM, protein calorie malnutrition, depression, anxiety, dysphagia, discoid lupus, GERD, RLS, hypertension, hyperlipidemia, neoplasm of pancreas, diverticulitis, s/p colostomy. Most recent hospitalization 9/16-9/28/23 due to acute ischemic stroke, seizure. ED visit on 10/8 d/t colostomy care, ED visit on 10/29 due to hypoglycemia.  Patient currently at [facility]. She still expresses being upset with having to live in [place]. She states she does not have family support. Staff report patient not pulling off ostomy bags as much; she  is allowing staff to assist with changing. HH still seeing patient per staff. She endorses good appetite. She reports pain to right leg. Patient state she fell in past couple of weeks; staff state patient had not reported any falls. She did have x-rays in ED on 10/29-negative x-rays of knee, shoulder negative. Staff deny any episodes of hypoglycemia in past week.  Receiving metformin, Levemir, Novolog with meals.   History obtained from review of EMR, discussion with primary team, and interview with family, facility staff/caregiver and/or Brittany [name].  I reviewed available labs, medications, imaging, studies and related documents from the EMR.  Records reviewed and summarized above.   ROS  A 10-Point ROS is negative, except for the pertinent positives and negatives detailed per the HPI.   Physical Exam: Pulse 82, resp 16, sats 95% on room air Constitutional: NAD General: frail appearing, thin EYES: anicteric sclera, lids intact, no discharge  ENMT: intact hearing, oral mucous membranes moist CV: S1S2, RRR, no LE edema Pulmonary: LCTA, no increased work of breathing, no cough, room air Abdomen: normo-active BS + 4 quadrants, soft and non tender, colostomy GU: deferred MSK: no sarcopenia, moves all extremities, ambulatory Skin: warm and dry, no rashes or wounds on visible skin Neuro: +generalized weakness Psych: non-anxious affect, A and O x 3 Hem/lymph/immuno: no widespread bruising   Thank you for the opportunity to participate in the care of [name]. Please call our office at [phone] if we can be of additional assistance.   Brittany Slocumb, NP   COVID-19 PATIENT SCREENING TOOL Asked and negative response unless otherwise noted:   Have you had symptoms of covid, tested positive or been in contact with someone with symptoms/positive test in the past 5-10 days?  No

## 2021-12-28 ENCOUNTER — Inpatient Hospital Stay
Admission: EM | Admit: 2021-12-28 | Discharge: 2021-12-30 | DRG: 641 | Disposition: A | Discharge status: Skilled Nursing Facility | Payer: Medicare Other | Source: Skilled Nursing Facility | Attending: Internal Medicine | Admitting: Internal Medicine

## 2021-12-28 ENCOUNTER — Other Ambulatory Visit: Payer: Self-pay

## 2021-12-28 DIAGNOSIS — Z933 Colostomy status: Secondary | ICD-10-CM

## 2021-12-28 DIAGNOSIS — E875 Hyperkalemia: Principal | ICD-10-CM | POA: Diagnosis present

## 2021-12-28 DIAGNOSIS — J449 Chronic obstructive pulmonary disease, unspecified: Secondary | ICD-10-CM | POA: Diagnosis present

## 2021-12-28 DIAGNOSIS — Z6823 Body mass index (BMI) 23.0-23.9, adult: Secondary | ICD-10-CM

## 2021-12-28 DIAGNOSIS — F17210 Nicotine dependence, cigarettes, uncomplicated: Secondary | ICD-10-CM | POA: Diagnosis present

## 2021-12-28 DIAGNOSIS — E785 Hyperlipidemia, unspecified: Secondary | ICD-10-CM | POA: Diagnosis present

## 2021-12-28 DIAGNOSIS — E43 Unspecified severe protein-calorie malnutrition: Secondary | ICD-10-CM

## 2021-12-28 DIAGNOSIS — R569 Unspecified convulsions: Secondary | ICD-10-CM

## 2021-12-28 DIAGNOSIS — Z8673 Personal history of transient ischemic attack (TIA), and cerebral infarction without residual deficits: Secondary | ICD-10-CM | POA: Diagnosis not present

## 2021-12-28 DIAGNOSIS — Z794 Long term (current) use of insulin: Secondary | ICD-10-CM

## 2021-12-28 DIAGNOSIS — Z7982 Long term (current) use of aspirin: Secondary | ICD-10-CM

## 2021-12-28 DIAGNOSIS — F32A Depression, unspecified: Secondary | ICD-10-CM | POA: Diagnosis present

## 2021-12-28 DIAGNOSIS — F17200 Nicotine dependence, unspecified, uncomplicated: Secondary | ICD-10-CM | POA: Diagnosis present

## 2021-12-28 DIAGNOSIS — I110 Hypertensive heart disease with heart failure: Secondary | ICD-10-CM | POA: Diagnosis present

## 2021-12-28 DIAGNOSIS — Z79899 Other long term (current) drug therapy: Secondary | ICD-10-CM

## 2021-12-28 DIAGNOSIS — G2581 Restless legs syndrome: Secondary | ICD-10-CM | POA: Diagnosis present

## 2021-12-28 DIAGNOSIS — I10 Essential (primary) hypertension: Secondary | ICD-10-CM | POA: Diagnosis present

## 2021-12-28 DIAGNOSIS — Z881 Allergy status to other antibiotic agents status: Secondary | ICD-10-CM

## 2021-12-28 DIAGNOSIS — E1169 Type 2 diabetes mellitus with other specified complication: Secondary | ICD-10-CM | POA: Diagnosis not present

## 2021-12-28 DIAGNOSIS — E119 Type 2 diabetes mellitus without complications: Secondary | ICD-10-CM | POA: Diagnosis present

## 2021-12-28 DIAGNOSIS — G40909 Epilepsy, unspecified, not intractable, without status epilepticus: Secondary | ICD-10-CM | POA: Diagnosis present

## 2021-12-28 DIAGNOSIS — Z8507 Personal history of malignant neoplasm of pancreas: Secondary | ICD-10-CM

## 2021-12-28 DIAGNOSIS — F419 Anxiety disorder, unspecified: Secondary | ICD-10-CM | POA: Diagnosis not present

## 2021-12-28 DIAGNOSIS — I5032 Chronic diastolic (congestive) heart failure: Secondary | ICD-10-CM | POA: Diagnosis present

## 2021-12-28 DIAGNOSIS — Z818 Family history of other mental and behavioral disorders: Secondary | ICD-10-CM

## 2021-12-28 DIAGNOSIS — E440 Moderate protein-calorie malnutrition: Secondary | ICD-10-CM | POA: Diagnosis present

## 2021-12-28 LAB — CBC
HCT: 35.9 % — ABNORMAL LOW (ref 36.0–46.0)
Hemoglobin: 11.2 g/dL — ABNORMAL LOW (ref 12.0–15.0)
MCH: 27.7 pg (ref 26.0–34.0)
MCHC: 31.2 g/dL (ref 30.0–36.0)
MCV: 88.9 fL (ref 80.0–100.0)
Platelets: 283 10*3/uL (ref 150–400)
RBC: 4.04 MIL/uL (ref 3.87–5.11)
RDW: 16.4 % — ABNORMAL HIGH (ref 11.5–15.5)
WBC: 7.5 10*3/uL (ref 4.0–10.5)
nRBC: 0 % (ref 0.0–0.2)

## 2021-12-28 LAB — MAGNESIUM: Magnesium: 2 mg/dL (ref 1.7–2.4)

## 2021-12-28 LAB — GLUCOSE, CAPILLARY: Glucose-Capillary: 205 mg/dL — ABNORMAL HIGH (ref 70–99)

## 2021-12-28 LAB — BASIC METABOLIC PANEL
Anion gap: 5 (ref 5–15)
BUN: 15 mg/dL (ref 8–23)
CO2: 20 mmol/L — ABNORMAL LOW (ref 22–32)
Calcium: 8.9 mg/dL (ref 8.9–10.3)
Chloride: 112 mmol/L — ABNORMAL HIGH (ref 98–111)
Creatinine, Ser: 1 mg/dL (ref 0.44–1.00)
GFR, Estimated: >60 mL/min (ref >60)
Glucose, Bld: 220 mg/dL — ABNORMAL HIGH (ref 70–99)
Potassium: 6.1 mmol/L — ABNORMAL HIGH (ref 3.5–5.1)
Sodium: 137 mmol/L (ref 135–145)

## 2021-12-28 LAB — PHOSPHORUS: Phosphorus: 3.8 mg/dL (ref 2.5–4.6)

## 2021-12-28 LAB — POTASSIUM: Potassium: 5.9 mmol/L — ABNORMAL HIGH (ref 3.5–5.1)

## 2021-12-28 LAB — BRAIN NATRIURETIC PEPTIDE: B Natriuretic Peptide: 27.8 pg/mL (ref 0.0–100.0)

## 2021-12-28 MED ORDER — FAMOTIDINE 20 MG PO TABS
20.0000 mg | ORAL_TABLET | Freq: Every day | ORAL | Status: DC
  Administered 2021-12-29 – 2021-12-30 (×2): 20 mg via ORAL
  Filled 2021-12-28 (×2): qty 1

## 2021-12-28 MED ORDER — ZINC SULFATE 220 (50 ZN) MG PO CAPS
220.0000 mg | ORAL_CAPSULE | Freq: Every day | ORAL | Status: DC
  Administered 2021-12-29 – 2021-12-30 (×2): 220 mg via ORAL
  Filled 2021-12-28 (×2): qty 1

## 2021-12-28 MED ORDER — BUPROPION HCL 75 MG PO TABS
75.0000 mg | ORAL_TABLET | Freq: Two times a day (BID) | ORAL | Status: DC
  Administered 2021-12-28 – 2021-12-30 (×4): 75 mg via ORAL
  Filled 2021-12-28 (×4): qty 1

## 2021-12-28 MED ORDER — CALCIUM GLUCONATE-NACL 1-0.675 GM/50ML-% IV SOLN
1.0000 g | Freq: Once | INTRAVENOUS | Status: AC
  Administered 2021-12-28: 1000 mg via INTRAVENOUS
  Filled 2021-12-28: qty 50

## 2021-12-28 MED ORDER — MIRTAZAPINE 15 MG PO TABS
15.0000 mg | ORAL_TABLET | Freq: Every day | ORAL | Status: DC
  Administered 2021-12-28 – 2021-12-29 (×2): 15 mg via ORAL
  Filled 2021-12-28 (×2): qty 1

## 2021-12-28 MED ORDER — ROPINIROLE HCL 1 MG PO TABS
0.5000 mg | ORAL_TABLET | Freq: Every day | ORAL | Status: DC
  Administered 2021-12-28 – 2021-12-29 (×2): 0.5 mg via ORAL
  Filled 2021-12-28 (×2): qty 1

## 2021-12-28 MED ORDER — GLUCERNA SHAKE PO LIQD
237.0000 mL | Freq: Three times a day (TID) | ORAL | Status: DC
  Administered 2021-12-28 – 2021-12-30 (×6): 237 mL via ORAL

## 2021-12-28 MED ORDER — DILTIAZEM HCL ER COATED BEADS 120 MG PO CP24
120.0000 mg | ORAL_CAPSULE | Freq: Every day | ORAL | Status: DC
  Administered 2021-12-29 – 2021-12-30 (×2): 120 mg via ORAL
  Filled 2021-12-28 (×2): qty 1

## 2021-12-28 MED ORDER — ONDANSETRON HCL 4 MG/2ML IJ SOLN: 4.0000 mg | Freq: Three times a day (TID) | INTRAMUSCULAR | Status: DC | PRN

## 2021-12-28 MED ORDER — ENOXAPARIN SODIUM 40 MG/0.4ML IJ SOSY
40.0000 mg | PREFILLED_SYRINGE | INTRAMUSCULAR | Status: DC
  Administered 2021-12-28 – 2021-12-29 (×2): 40 mg via SUBCUTANEOUS
  Filled 2021-12-28 (×2): qty 0.4

## 2021-12-28 MED ORDER — QUETIAPINE FUMARATE 25 MG PO TABS
50.0000 mg | ORAL_TABLET | Freq: Every day | ORAL | Status: DC
  Administered 2021-12-28 – 2021-12-29 (×2): 50 mg via ORAL
  Filled 2021-12-28 (×2): qty 2

## 2021-12-28 MED ORDER — INSULIN GLARGINE-YFGN 100 UNIT/ML ~~LOC~~ SOLN
7.0000 [IU] | Freq: Two times a day (BID) | SUBCUTANEOUS | Status: DC
  Administered 2021-12-28 – 2021-12-29 (×2): 7 [IU] via SUBCUTANEOUS
  Filled 2021-12-28 (×3): qty 0.07

## 2021-12-28 MED ORDER — ALBUTEROL SULFATE (2.5 MG/3ML) 0.083% IN NEBU: 3.0000 mL | INHALATION_SOLUTION | RESPIRATORY_TRACT | Status: DC | PRN

## 2021-12-28 MED ORDER — SODIUM ZIRCONIUM CYCLOSILICATE 10 G PO PACK
10.0000 g | PACK | Freq: Once | ORAL | Status: DC
  Filled 2021-12-28 (×2): qty 1

## 2021-12-28 MED ORDER — ATORVASTATIN CALCIUM 20 MG PO TABS
40.0000 mg | ORAL_TABLET | Freq: Every day | ORAL | Status: DC
  Administered 2021-12-28 – 2021-12-29 (×2): 40 mg via ORAL
  Filled 2021-12-28 (×2): qty 2

## 2021-12-28 MED ORDER — ADULT MULTIVITAMIN W/MINERALS CH
1.0000 | ORAL_TABLET | Freq: Every day | ORAL | Status: DC
  Administered 2021-12-29 – 2021-12-30 (×2): 1 via ORAL
  Filled 2021-12-28 (×2): qty 1

## 2021-12-28 MED ORDER — INSULIN ASPART 100 UNIT/ML IJ SOLN
0.0000 [IU] | Freq: Three times a day (TID) | INTRAMUSCULAR | Status: DC
  Administered 2021-12-29: 2 [IU] via SUBCUTANEOUS
  Administered 2021-12-29: 1 [IU] via SUBCUTANEOUS
  Administered 2021-12-29: 3 [IU] via SUBCUTANEOUS
  Administered 2021-12-30: 5 [IU] via SUBCUTANEOUS
  Administered 2021-12-30: 2 [IU] via SUBCUTANEOUS
  Filled 2021-12-28 (×5): qty 1

## 2021-12-28 MED ORDER — DM-GUAIFENESIN ER 30-600 MG PO TB12: 1.0000 | ORAL_TABLET | Freq: Two times a day (BID) | ORAL | Status: DC | PRN

## 2021-12-28 MED ORDER — ACETAMINOPHEN 325 MG PO TABS: 650.0000 mg | ORAL_TABLET | Freq: Four times a day (QID) | ORAL | Status: DC | PRN

## 2021-12-28 MED ORDER — DEXTROSE 50 % IV SOLN
1.0000 | Freq: Once | INTRAVENOUS | Status: AC
  Administered 2021-12-28: 50 mL via INTRAVENOUS
  Filled 2021-12-28: qty 50

## 2021-12-28 MED ORDER — MELATONIN 5 MG PO TABS
2.5000 mg | ORAL_TABLET | Freq: Every day | ORAL | Status: DC
  Administered 2021-12-28 – 2021-12-29 (×2): 2.5 mg via ORAL
  Filled 2021-12-28 (×2): qty 1

## 2021-12-28 MED ORDER — SODIUM CHLORIDE 0.9 % IV SOLN: INTRAVENOUS | Status: DC

## 2021-12-28 MED ORDER — SODIUM CHLORIDE 0.9 % IV BOLUS
500.0000 mL | Freq: Once | INTRAVENOUS | Status: AC
  Administered 2021-12-28: 500 mL via INTRAVENOUS

## 2021-12-28 MED ORDER — CLONAZEPAM 0.5 MG PO TABS: 0.5000 mg | ORAL_TABLET | Freq: Two times a day (BID) | ORAL | Status: DC | PRN

## 2021-12-28 MED ORDER — INSULIN ASPART 100 UNIT/ML IJ SOLN
0.0000 [IU] | Freq: Every day | INTRAMUSCULAR | Status: DC
  Administered 2021-12-28: 2 [IU] via SUBCUTANEOUS
  Filled 2021-12-28: qty 1

## 2021-12-28 MED ORDER — ASPIRIN 81 MG PO TBEC
81.0000 mg | DELAYED_RELEASE_TABLET | Freq: Every day | ORAL | Status: DC
  Administered 2021-12-29 – 2021-12-30 (×2): 81 mg via ORAL
  Filled 2021-12-28 (×2): qty 1

## 2021-12-28 MED ORDER — LORAZEPAM 2 MG/ML IJ SOLN: 1.0000 mg | INTRAMUSCULAR | Status: DC | PRN

## 2021-12-28 MED ORDER — HYDRALAZINE HCL 20 MG/ML IJ SOLN: 5.0000 mg | INTRAMUSCULAR | Status: DC | PRN

## 2021-12-28 MED ORDER — NICOTINE 21 MG/24HR TD PT24
21.0000 mg | MEDICATED_PATCH | Freq: Every day | TRANSDERMAL | Status: DC
  Filled 2021-12-28 (×2): qty 1

## 2021-12-28 MED ORDER — INSULIN ASPART 100 UNIT/ML IV SOLN
5.0000 [IU] | Freq: Once | INTRAVENOUS | Status: AC
  Administered 2021-12-28: 5 [IU] via INTRAVENOUS
  Filled 2021-12-28: qty 0.05

## 2021-12-28 MED ORDER — SODIUM BICARBONATE 8.4 % IV SOLN
50.0000 meq | Freq: Once | INTRAVENOUS | Status: AC
  Administered 2021-12-28: 50 meq via INTRAVENOUS
  Filled 2021-12-28: qty 50

## 2021-12-28 MED ORDER — DIVALPROEX SODIUM 500 MG PO DR TAB
500.0000 mg | DELAYED_RELEASE_TABLET | Freq: Two times a day (BID) | ORAL | Status: DC
  Administered 2021-12-28 – 2021-12-30 (×4): 500 mg via ORAL
  Filled 2021-12-28 (×4): qty 1

## 2021-12-28 NOTE — ED Notes (Signed)
This RN unable to get IV access, IV team consult placed.

## 2021-12-28 NOTE — ED Notes (Signed)
First nurse note:   Pt here via [facility] from [facility] with c/o hyperkalemia.  CBG 255 114/80 100% RA HR: 88

## 2021-12-28 NOTE — ED Provider Notes (Signed)
[facility] Provider Note    Event Date/Time   First MD Initiated Contact with Patient 12/28/21 1557     (approximate)   History   No chief complaint on file.   HPI  Brittany [name] is a 61 y.o. female with history of diabetes, hypertension, lupus, seizure disorder and stroke who presents with hyperkalemia noted on labs at her facility today.  The patient denies any acute symptoms.  She has no weakness or lightheadedness, muscle cramps or spasms, palpitations, difficulty breathing, or any other acute symptoms.  I reviewed the past medical records.  The patient's most recent admission was in September and per the hospitalist discharge summary from 9/28 she presented with concern for acute ischemic stroke and was diagnosed with left temporal seizure in the setting of hypoglycemia.  Physical Exam   Triage Vital Signs: ED Triage Vitals  Enc Vitals Group     BP 12/28/21 1511 101/79     Pulse Rate 12/28/21 1511 94     Resp 12/28/21 1511 16     Temp 12/28/21 1511 97.9 F (36.6 C)     Temp src --      SpO2 12/28/21 1511 96 %     Weight 12/28/21 1512 121 lb (54.9 kg)     Height 12/28/21 1512 5' (1.524 m)     Head Circumference --      Peak Flow --      Pain Score 12/28/21 1511 0     Pain Loc --      Pain Edu? --      Excl. in [place]? --     Most recent vital signs: Vitals:   12/28/21 1511  BP: 101/79  Pulse: 94  Resp: 16  Temp: 97.9 F (36.6 C)  SpO2: 96%     General: Alert and oriented, well-appearing,, no distress.  CV:  Good peripheral perfusion.  Resp:  Normal effort.  Abd:  Soft and nontender.  No distention.  Other:  EOMI.  PERRLA.  Motor intact in all extremities.  Normal coordination.   ED Results / Procedures / Treatments   Labs (all labs ordered are listed, but only abnormal results are displayed) Labs Reviewed  CBC - Abnormal; Notable for the following components:      Result Value   Hemoglobin 11.2 (*)    HCT 35.9 (*)    RDW  16.4 (*)    All other components within normal limits  BASIC METABOLIC PANEL - Abnormal; Notable for the following components:   Potassium 6.1 (*)    Chloride 112 (*)    CO2 20 (*)    Glucose, Bld 220 (*)    All other components within normal limits  MAGNESIUM  PHOSPHORUS  BRAIN NATRIURETIC PEPTIDE  BASIC METABOLIC PANEL  POTASSIUM     EKG  ED ECG REPORT I, Arta Silence, the attending physician, personally viewed and interpreted this ECG.  Date: 12/28/2021 EKG Time: 1517 Rate: 97 (incorrectly read by machine as 195) Rhythm: normal sinus rhythm QRS Axis: normal Intervals: LPFB ST/T Wave abnormalities: Borderline peaked T waves Narrative Interpretation: no evidence of acute ischemia     RADIOLOGY   PROCEDURES:  Critical Care performed: No  .Critical Care  Performed by: Arta Silence, MD Authorized by: Arta Silence, MD   Critical care provider statement:    Critical care time (minutes):  30   Critical care time was exclusive of:  Separately billable procedures and treating other patients   Critical care  was necessary to treat or prevent imminent or life-threatening deterioration of the following conditions:  Metabolic crisis and cardiac failure   Critical care was time spent personally by me on the following activities:  Development of treatment plan with patient or surrogate, discussions with consultants, evaluation of patient's response to treatment, examination of patient, ordering and review of laboratory studies, ordering and review of radiographic studies, ordering and performing treatments and interventions, pulse oximetry, re-evaluation of patient's condition, review of old charts and obtaining history from patient or surrogate   Care discussed with: admitting provider      MEDICATIONS ORDERED IN ED: Medications  sodium chloride 0.9 % bolus 500 mL (has no administration in time range)  sodium zirconium cyclosilicate (LOKELMA) packet 10 g  (has no administration in time range)  insulin aspart (novoLOG) injection 5 Units (has no administration in time range)    And  dextrose 50 % solution 50 mL (has no administration in time range)  calcium gluconate 1 g/ 50 mL sodium chloride IVPB (has no administration in time range)  sodium bicarbonate injection 50 mEq (has no administration in time range)  nicotine (NICODERM CQ - dosed in mg/24 hours) patch 21 mg (has no administration in time range)  albuterol (PROVENTIL) (2.5 MG/3ML) 0.083% nebulizer solution 3 mL (has no administration in time range)  dextromethorphan-guaiFENesin (MUCINEX DM) 30-600 MG per 12 hr tablet 1 tablet (has no administration in time range)  LORazepam (ATIVAN) injection 1 mg (has no administration in time range)  ondansetron (ZOFRAN) injection 4 mg (has no administration in time range)  acetaminophen (TYLENOL) tablet 650 mg (has no administration in time range)  hydrALAZINE (APRESOLINE) injection 5 mg (has no administration in time range)  enoxaparin (LOVENOX) injection 40 mg (has no administration in time range)     IMPRESSION / MDM / ASSESSMENT AND PLAN / ED COURSE  I reviewed the triage vital signs and the nursing notes.  61 year old female with PMH as noted above presents with asymptomatic hyperglycemia.  She has no acute renal insufficiency so the cause is unclear.  EKG shows borderline peaked T waves.  Differential diagnosis includes, but is not limited to, hypoglycemia, other electrolyte disturbance.  I have ordered fluids, calcium gluconate, Lokelma, as well as insulin and dextrose.  Patient's presentation is most consistent with acute presentation with potential threat to life or bodily function.  The patient is on the cardiac monitor to evaluate for evidence of arrhythmia and/or significant heart rate changes.  ----------------------------------------- 5:14 PM on 12/28/2021 -----------------------------------------  Lab work-up confirms potassium  of 6.1 with normal creatinine.  There is no leukocytosis.  Magnesium and phosphorus are pending.  I consulted Dr. Blaine Hamper from the hospitalist service; based on our discussion he agrees to admit the patient.  FINAL CLINICAL IMPRESSION(S) / ED DIAGNOSES   Final diagnoses:  Hyperkalemia     Rx / DC Orders   ED Discharge Orders     None        Note:  This document was prepared using Dragon voice recognition software and may include unintentional dictation errors.    Arta Silence, MD 12/28/21 [phone]

## 2021-12-28 NOTE — ED Triage Notes (Signed)
Pt to ED [facility] from [facility] for abnormal potassium. Pt denies complaints.

## 2021-12-28 NOTE — ED Notes (Signed)
[facility] called and did not receive answer, no voice box set up.

## 2021-12-28 NOTE — H&P (Addendum)
History and Physical    RHYLAN [name] YJE:[id number] DOB: Jun 24, 1960 DOA: 12/28/2021  Referring MD/NP/PA:   PCP: [name], NP   Patient coming from:  The patient is coming from [place]  Chief Complaint: Abnormal lab with hyperkalemia  HPI: Brittany [name] is a 61 y.o. 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 difficile colitis, dCHF, who presents with abnormal lab with hyperkalemia.  Pt is brought to ED from [place] facility due to abnormal lab with hyperkalemia.  The BMP in ED showed potassium 6.1.  EKG showed T wave peaking.  Patient is asymptomatic.  Denies chest pain, cough, shortness breath.  No fever or chills.  No nausea vomiting, diarrhea or abdominal pain.  No symptoms of UTI.  Data reviewed independently and ED Course: pt was found to have potassium 6.1, WBC 7.5, GFR> 60, temperature normal, blood pressure 101/79, heart rate 94, RR 16, oxygen saturation 96% on room air.   EKG: I have personally reviewed.  Sinus rhythm, QTc 277, LAE, RAD, poor R wave progression, T wave peaking.  Low voltage.   Review of Systems:   General: no fevers, chills, no body weight gain, fatigue HEENT: no blurry vision, hearing changes or sore throat Respiratory: no dyspnea, coughing, wheezing CV: no chest pain, no palpitations GI: no nausea, vomiting, abdominal pain, diarrhea, constipation GU: no dysuria, burning on urination, increased urinary frequency, hematuria  Ext: no leg edema Neuro: no unilateral weakness, numbness, or tingling, no vision change or hearing loss Skin: no rash, no skin tear. MSK: No muscle spasm, no deformity, no limitation of range of movement in spin Heme: No easy bruising.  Travel history: No recent long distant travel.   Allergy:  Allergies  Allergen Reactions    Cephalosporins Itching    TOLERATED ZOSYN (PIPERACILLIN) BEFORE   Hydromorphone Itching   Keflin [Cephalothin] Itching   Lactose Intolerance (Gi) Diarrhea    Past Medical History:  Diagnosis Date   C. difficile colitis 02/17/2021   Colostomy in place [facility])    h/o diverticulitis with abscess   Complicated grief 6/37/8588   COPD, mild ([place]) 09/16/2015   PFTs on 06/15/15 with FEV1/FVC ratio of 64%, FEV1 83%, DLCO 47%   Diabetes mellitus without complication (HCC)    Dyslipidemia    History of Clostridium difficile colitis 03/03/2021   Hyperosmolar hyperglycemic state (HHS) ([place]) 11/18/2020   Hypertension    Hypokalemia; hyperkalemia 07/12/2019   Lactic acidosis 11/19/2020   Lupus (HCC)    Pancreatic cancer (HCC)    RLS (restless legs syndrome)    Seizure disorder ([place])    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 She has been exposed to tobacco smoke. She has never used smokeless tobacco. She reports that she does not currently use alcohol. She reports that she does not currently use drugs after having used the following drugs: Marijuana.  Family History:  Family History  Problem Relation Age of Onset   Anxiety disorder Sister  Breast cancer Maternal Aunt      Prior to Admission medications   Medication Sig Start Date End Date Taking? Authorizing Provider  aspirin 81 MG EC tablet Take 1 tablet (81 mg total) by mouth daily. Swallow whole. 05/23/21   Pokhrel, Corrie Mckusick, MD  atorvastatin (LIPITOR) 40 MG tablet Take 1 tablet (40 mg total) by mouth Nightly. Patient taking differently: Take  40 mg by mouth at bedtime. 03/14/21 10/28/21  Fritzi Mandes, MD  diltiazem (CARDIZEM CD) 120 MG 24 hr capsule Take 1 capsule (120 mg total) by mouth daily. 10/17/21   Shelly Coss, MD  divalproex (DEPAKOTE) 500 MG DR tablet Take 1 tablet (500 mg total) by mouth 2 (two) times daily. 11/09/21   Janine Ores, NP  famotidine (PEPCID) 20 MG tablet Take 1 tablet (20 mg total) by mouth daily. 08/25/21   Fritzi Mandes, MD  feeding supplement, GLUCERNA SHAKE, (GLUCERNA SHAKE) LIQD Take 237 mLs by mouth 3 (three) times daily between meals. 08/24/21   Fritzi Mandes, MD  hydrOXYzine (ATARAX) 10 MG tablet Take 1 tablet (10 mg total) by mouth 3 (three) times daily as needed for itching. 11/03/21   Janine Ores, NP  insulin aspart (NOVOLOG) 100 UNIT/ML injection Inject 5 Units into the skin 3 (three) times daily with meals. If eats more than 5)% of meals Patient taking differently: Inject 5 Units into the skin 3 (three) times daily with meals. 10/16/21   Shelly Coss, MD  insulin detemir (LEVEMIR) 100 UNIT/ML injection Inject 0.08 mLs (8 Units total) into the skin 2 (two) times daily. Patient taking differently: Inject 11 Units into the skin at bedtime. 10/23/21   Lorella Nimrod, MD  insulin lispro (HUMALOG) 100 UNIT/ML injection 0-9 units TID with meals, and 0-5 units QHS bases on sliding scale previously prescribed to patient. Patient not taking: Reported on 10/21/2021 08/31/21 08/31/22  Cherene Altes, MD  lacosamide 100 MG TABS Take 1 tablet (100 mg total) by mouth 2 (two) times daily. 11/03/21   Janine Ores, NP  melatonin 5 MG TABS Take 0.5 tablets (2.5 mg total) by mouth at bedtime. 10/16/21   Shelly Coss, MD  mirtazapine (REMERON) 15 MG tablet Take 15 mg by mouth at bedtime.    [provider]  Multiple Vitamin (MULTIVITAMIN WITH MINERALS) TABS tablet Take 1 tablet by mouth daily. 08/25/21   Fritzi Mandes, MD  nicotine (NICODERM CQ - DOSED IN MG/24 HOURS) 21 mg/24hr patch Place 1 patch (21 mg total) onto the  skin daily as needed (nicotine craving). 10/16/21   Shelly Coss, MD  QUEtiapine (SEROQUEL) 25 MG tablet Take 1 tablet (25 mg total) by mouth at bedtime. 10/16/21   Shelly Coss, MD  rOPINIRole (REQUIP) 0.5 MG tablet Take 0.5 mg by mouth at bedtime. 06/29/21   [provider]  zinc sulfate 220 (50 Zn) MG capsule Take 1 capsule (220 mg total) by mouth daily. 10/17/21   Shelly Coss, MD    Physical Exam: Vitals:   12/28/21 1841 12/28/21 1928 12/29/21 0417 12/29/21 0759  BP: (!) 145/95 (!) 140/96 127/89 93/69  Pulse: 89 88 87 95  Resp: '16 20 20 17  '$ Temp: (!) 97.5 F (36.4 C) 98.1 F (36.7 C) 98.2 F (36.8 C) (!) 97.5 F (36.4 C)  TempSrc: Oral Oral Oral Oral  SpO2: 100% 98% 96% 100%  Weight:      Height:       General: Not in acute distress HEENT:       Eyes: PERRL, EOMI,  no scleral icterus.       ENT: No discharge from the ears and nose, no pharynx injection, no tonsillar enlargement.        Neck: No JVD, no bruit, no mass felt. Heme: No neck lymph node enlargement. Cardiac: S1/S2, RRR, No murmurs, No gallops or rubs. Respiratory: No rales, wheezing, rhonchi or rubs. GI: Soft, nondistended, nontender, no rebound pain, no organomegaly, BS present. GU: No hematuria Ext: No pitting leg edema bilaterally. 1+DP/PT pulse bilaterally. Musculoskeletal: No joint deformities, No joint redness or warmth, no limitation of ROM in spin. Skin: No rashes.  Neuro: Alert, oriented X3, cranial nerves II-XII grossly intact, moves all extremities  Psych: Patient is not psychotic, no suicidal or hemocidal ideation.  Labs on Admission: I have personally reviewed following labs and imaging studies  CBC: Recent Labs  Lab 12/28/21 1514  WBC 7.5  HGB 11.2*  HCT 35.9*  MCV 88.9  PLT 876   Basic Metabolic Panel: Recent Labs  Lab 12/28/21 1514 12/28/21 1907 12/28/21 1908 12/29/21 0436  NA 137  --   --  140  K 6.1* 5.9*  --  6.6*  CL 112*  --   --  113*  CO2 20*  --   --  21*   GLUCOSE 220*  --   --  206*  BUN 15  --   --  13  CREATININE 1.00  --   --  0.78  CALCIUM 8.9  --   --  8.6*  MG  --   --  2.0  --   PHOS  --   --  3.8  --    GFR: Estimated Creatinine Clearance: 57.5 mL/min (by C-G formula based on SCr of 0.78 mg/dL). Liver Function Tests: No results for input(s): "AST", "ALT", "ALKPHOS", "BILITOT", "PROT", "ALBUMIN" in the last 168 hours. No results for input(s): "LIPASE", "AMYLASE" in the last 168 hours. No results for input(s): "AMMONIA" in the last 168 hours. Coagulation Profile: No results for input(s): "INR", "PROTIME" in the last 168 hours. Cardiac Enzymes: No results for input(s): "CKTOTAL", "CKMB", "CKMBINDEX", "TROPONINI" in the last 168 hours. BNP (last 3 results) No results for input(s): "PROBNP" in the last 8760 hours. HbA1C: No results for input(s): "HGBA1C" in the last 72 hours. CBG: Recent Labs  Lab 12/28/21 1833 12/28/21 2103 12/29/21 0800  GLUCAP 213* 205* 229*   Lipid Profile: No results for input(s): "CHOL", "HDL", "LDLCALC", "TRIG", "CHOLHDL", "LDLDIRECT" in the last 72 hours. Thyroid Function Tests: No results for input(s): "TSH", "T4TOTAL", "FREET4", "T3FREE", "THYROIDAB" in the last 72 hours. Anemia Panel: No results for input(s): "VITAMINB12", "FOLATE", "FERRITIN", "TIBC", "IRON", "RETICCTPCT" in the last 72 hours. Urine analysis:    Component Value Date/Time   COLORURINE YELLOW (A) 12/10/2021 1445   APPEARANCEUR HAZY (A) 12/10/2021 1445   APPEARANCEUR Hazy 04/04/2011 1935   LABSPEC 1.009 12/10/2021 1445   LABSPEC >1.060 04/04/2011 1935   PHURINE 5.0 12/10/2021 1445   GLUCOSEU >=500 (A) 12/10/2021 1445   GLUCOSEU 50 mg/dL 04/04/2011 1935   HGBUR NEGATIVE 12/10/2021 1445   BILIRUBINUR NEGATIVE 12/10/2021 1445   BILIRUBINUR Negative 04/04/2011 1935   KETONESUR NEGATIVE 12/10/2021 1445   PROTEINUR NEGATIVE 12/10/2021 1445   NITRITE NEGATIVE 12/10/2021 1445   LEUKOCYTESUR MODERATE (A) 12/10/2021 1445    LEUKOCYTESUR Negative 04/04/2011 1935   Sepsis Labs: '@LABRCNTIP'$ (procalcitonin:4,lacticidven:4) )No results found for this or any previous visit (from the past 240 hour(s)).   Radiological Exams on Admission: No results found.  Assessment/Plan Principal Problem:   Hyperkalemia Active Problems:   Type 2 diabetes mellitus with hyperlipidemia (HCC)   History of stroke   Seizure (HCC)   Anxiety and depression   HLD (hyperlipidemia)   Tobacco dependence   Chronic diastolic CHF (congestive heart failure) (HCC)   HTN (hypertension)   Protein-calorie malnutrition, moderate (HCC)   Assessment and Plan:  Hyperkalemia: K =6.1.  EKG showed T wave peaking.  Heart rate 94.  Patient is asymptomatic.  Renal function with GFR> 60.  -Placed on telemetry bed for position -1 g calcium gluconate, D50, NovoLog 5 unit, 10 g of leukoma, 50 mEq of sodium bicarbonate -IV fluid: 500 cc normal saline, then 75 cc/h -Repeat potassium level at 10 PM  Type 2 diabetes mellitus with hyperlipidemia [facility]): Recent A1c 13.2, poorly controlled.  Patient is taking NovoLog and Levemir 10 units twice daily -SSI -Glargine insulin: 70 units twice daily  History of stroke -Aspirin, Lipitor  Seizure ([place]) -Seizure precaution -As needed Ativan for seizure -Continue home Depakote  Anxiety and depression -Continue home medications  HLD (hyperlipidemia) -Lipitor  Tobacco dependence -Nicotine patch  Chronic diastolic CHF (congestive heart failure) ([place]): 2D echo on 10/29/2021 showed EF of 55 to 60% with grade 1 diastolic dysfunction.  Patient does not have leg edema JVD.  CHF is compensated. -Check BNP  HTN: -diltiazem -prn IV hydralazine  Protein-calorie malnutrition, moderate: Body weight 54.9 kg, BMI 23.63 -Continue home nutrition supplement    DVT ppx:   SQ Lovenox  Code Status: Full code  Family Communication: not done, no family member is at bed side.       Disposition Plan:  Anticipate  discharge back to previous environment  Consults called:  none  Admission status and Level of care: Med-Surg:  for obs      Dispo: The patient is from:  [place]              Anticipated d/c is to:  [place]              Anticipated d/c date is: 1 day              Patient currently is not medically stable to d/c.    Severity of Illness:  The appropriate patient status for this patient is OBSERVATION. Observation status is judged to be reasonable and necessary in order to provide the required intensity of service to ensure the patient's safety. The patient's presenting symptoms, physical exam findings, and initial radiographic and laboratory data in the context of their medical condition is felt to place them at decreased risk for further clinical deterioration. Furthermore, it is anticipated that the patient will be medically stable for discharge from the hospital within 2 midnights of admission.        Date of Service 12/29/2021    [facility]   If 7PM-7AM, please contact night-coverage [email or web address] 12/29/2021, 10:24 AM

## 2021-12-29 DIAGNOSIS — Z7982 Long term (current) use of aspirin: Secondary | ICD-10-CM | POA: Diagnosis not present

## 2021-12-29 DIAGNOSIS — I5032 Chronic diastolic (congestive) heart failure: Secondary | ICD-10-CM | POA: Diagnosis present

## 2021-12-29 DIAGNOSIS — Z8673 Personal history of transient ischemic attack (TIA), and cerebral infarction without residual deficits: Secondary | ICD-10-CM | POA: Diagnosis not present

## 2021-12-29 DIAGNOSIS — E785 Hyperlipidemia, unspecified: Secondary | ICD-10-CM | POA: Diagnosis present

## 2021-12-29 DIAGNOSIS — Z933 Colostomy status: Secondary | ICD-10-CM | POA: Diagnosis not present

## 2021-12-29 DIAGNOSIS — I110 Hypertensive heart disease with heart failure: Secondary | ICD-10-CM | POA: Diagnosis present

## 2021-12-29 DIAGNOSIS — F419 Anxiety disorder, unspecified: Secondary | ICD-10-CM | POA: Diagnosis present

## 2021-12-29 DIAGNOSIS — G40909 Epilepsy, unspecified, not intractable, without status epilepticus: Secondary | ICD-10-CM | POA: Diagnosis present

## 2021-12-29 DIAGNOSIS — J449 Chronic obstructive pulmonary disease, unspecified: Secondary | ICD-10-CM | POA: Diagnosis present

## 2021-12-29 DIAGNOSIS — Z881 Allergy status to other antibiotic agents status: Secondary | ICD-10-CM | POA: Diagnosis not present

## 2021-12-29 DIAGNOSIS — Z818 Family history of other mental and behavioral disorders: Secondary | ICD-10-CM | POA: Diagnosis not present

## 2021-12-29 DIAGNOSIS — E1169 Type 2 diabetes mellitus with other specified complication: Secondary | ICD-10-CM | POA: Diagnosis present

## 2021-12-29 DIAGNOSIS — Z794 Long term (current) use of insulin: Secondary | ICD-10-CM | POA: Diagnosis not present

## 2021-12-29 DIAGNOSIS — Z8507 Personal history of malignant neoplasm of pancreas: Secondary | ICD-10-CM | POA: Diagnosis not present

## 2021-12-29 DIAGNOSIS — E440 Moderate protein-calorie malnutrition: Secondary | ICD-10-CM | POA: Diagnosis present

## 2021-12-29 DIAGNOSIS — G2581 Restless legs syndrome: Secondary | ICD-10-CM | POA: Diagnosis present

## 2021-12-29 DIAGNOSIS — Z79899 Other long term (current) drug therapy: Secondary | ICD-10-CM | POA: Diagnosis not present

## 2021-12-29 DIAGNOSIS — F32A Depression, unspecified: Secondary | ICD-10-CM | POA: Diagnosis present

## 2021-12-29 DIAGNOSIS — Z6823 Body mass index (BMI) 23.0-23.9, adult: Secondary | ICD-10-CM | POA: Diagnosis not present

## 2021-12-29 DIAGNOSIS — I10 Essential (primary) hypertension: Secondary | ICD-10-CM | POA: Diagnosis present

## 2021-12-29 DIAGNOSIS — F17210 Nicotine dependence, cigarettes, uncomplicated: Secondary | ICD-10-CM | POA: Diagnosis present

## 2021-12-29 DIAGNOSIS — E875 Hyperkalemia: Secondary | ICD-10-CM | POA: Diagnosis present

## 2021-12-29 LAB — BASIC METABOLIC PANEL
Anion gap: 6 (ref 5–15)
BUN: 13 mg/dL (ref 8–23)
CO2: 21 mmol/L — ABNORMAL LOW (ref 22–32)
Calcium: 8.6 mg/dL — ABNORMAL LOW (ref 8.9–10.3)
Chloride: 113 mmol/L — ABNORMAL HIGH (ref 98–111)
Creatinine, Ser: 0.78 mg/dL (ref 0.44–1.00)
GFR, Estimated: >60 mL/min (ref >60)
Glucose, Bld: 206 mg/dL — ABNORMAL HIGH (ref 70–99)
Potassium: 6.6 mmol/L (ref 3.5–5.1)
Sodium: 140 mmol/L (ref 135–145)

## 2021-12-29 LAB — POTASSIUM
Potassium: 5.3 mmol/L — ABNORMAL HIGH (ref 3.5–5.1)
Potassium: 5.9 mmol/L — ABNORMAL HIGH (ref 3.5–5.1)
Potassium: 5.6 mmol/L — ABNORMAL HIGH (ref 3.5–5.1)
Potassium: 5.2 mmol/L — ABNORMAL HIGH (ref 3.5–5.1)
Potassium: 5.6 mmol/L — ABNORMAL HIGH (ref 3.5–5.1)

## 2021-12-29 LAB — GLUCOSE, CAPILLARY
Glucose-Capillary: 213 mg/dL — ABNORMAL HIGH (ref 70–99)
Glucose-Capillary: 229 mg/dL — ABNORMAL HIGH (ref 70–99)
Glucose-Capillary: 144 mg/dL — ABNORMAL HIGH (ref 70–99)
Glucose-Capillary: 233 mg/dL — ABNORMAL HIGH (ref 70–99)
Glucose-Capillary: 416 mg/dL — ABNORMAL HIGH (ref 70–99)
Glucose-Capillary: 451 mg/dL — ABNORMAL HIGH (ref 70–99)

## 2021-12-29 MED ORDER — DEXTROSE 50 % IV SOLN
1.0000 | Freq: Once | INTRAVENOUS | Status: AC
  Administered 2021-12-29: 50 mL via INTRAVENOUS
  Filled 2021-12-29: qty 50

## 2021-12-29 MED ORDER — DEXTROSE 50 % IV SOLN: 1.0000 | Freq: Once | INTRAVENOUS | Status: DC

## 2021-12-29 MED ORDER — INSULIN GLARGINE-YFGN 100 UNIT/ML ~~LOC~~ SOLN
8.0000 [IU] | Freq: Two times a day (BID) | SUBCUTANEOUS | Status: DC
  Administered 2021-12-29 – 2021-12-30 (×2): 8 [IU] via SUBCUTANEOUS
  Filled 2021-12-29 (×3): qty 0.08

## 2021-12-29 MED ORDER — INSULIN ASPART 100 UNIT/ML IV SOLN
10.0000 [IU] | Freq: Once | INTRAVENOUS | Status: DC
  Filled 2021-12-29: qty 0.1

## 2021-12-29 MED ORDER — SODIUM ZIRCONIUM CYCLOSILICATE 10 G PO PACK
10.0000 g | PACK | Freq: Three times a day (TID) | ORAL | Status: AC
  Administered 2021-12-29 – 2021-12-30 (×3): 10 g via ORAL
  Filled 2021-12-29 (×3): qty 1

## 2021-12-29 MED ORDER — INSULIN ASPART 100 UNIT/ML IJ SOLN
7.0000 [IU] | Freq: Once | INTRAMUSCULAR | Status: AC
  Administered 2021-12-29: 7 [IU] via SUBCUTANEOUS
  Filled 2021-12-29: qty 1

## 2021-12-29 MED ORDER — STERILE WATER FOR INJECTION IV SOLN
INTRAVENOUS | Status: DC
  Filled 2021-12-29: qty 1000
  Filled 2021-12-29 (×2): qty 150

## 2021-12-29 MED ORDER — SODIUM BICARBONATE 8.4 % IV SOLN
50.0000 meq | Freq: Once | INTRAVENOUS | Status: AC
  Administered 2021-12-29: 50 meq via INTRAVENOUS
  Filled 2021-12-29: qty 50

## 2021-12-29 MED ORDER — INSULIN ASPART 100 UNIT/ML IV SOLN
10.0000 [IU] | Freq: Once | INTRAVENOUS | Status: AC
  Administered 2021-12-29: 10 [IU] via INTRAVENOUS
  Filled 2021-12-29: qty 0.1

## 2021-12-29 MED ORDER — CALCIUM GLUCONATE-NACL 1-0.675 GM/50ML-% IV SOLN
1.0000 g | Freq: Once | INTRAVENOUS | Status: AC
  Administered 2021-12-29: 1000 mg via INTRAVENOUS
  Filled 2021-12-29: qty 50
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
## 2021-12-29 NOTE — Progress Notes (Signed)
PROGRESS NOTE    Brittany [name]  NIO:[id number] DOB: 1960/08/07 DOA: 12/28/2021 PCP: [name], NP    Brief Narrative:  61 y.o. 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 difficile colitis, dCHF, who presents with abnormal lab with hyperkalemia.   Pt is brought to ED from [facility] facility due to abnormal lab with hyperkalemia.  The BMP in ED showed potassium 6.1.  EKG showed T wave peaking.  Patient is asymptomatic.  Denies chest pain, cough, shortness breath.  No fever or chills.  No nausea vomiting, diarrhea or abdominal pain.  No symptoms of UTI.  On arrival received insulin 10 units IV, dextrose, sodium bicarbonate, calcium gluconate.  Also received Lokelma.  Despite this the following morning potassium uptrending to 6.6.  Again unclear etiology.  Kidney function normal.   Assessment & Plan:   Principal Problem:   Hyperkalemia Active Problems:   Type 2 diabetes mellitus with hyperlipidemia (HCC)   History of stroke   Seizure (HCC)   Anxiety and depression   HLD (hyperlipidemia)   Tobacco dependence   Chronic diastolic CHF (congestive heart failure) (HCC)   HTN (hypertension)   Protein-calorie malnutrition, moderate (HCC)  Hyperkalemia:  Serum potassium 6.1 on arrival.  EKG with T wave peaking.  Patient is asymptomatic.  No chest pain.  Kidney function normal.  Received calcium gluconate, D50, IV insulin, Lokelma, sodium bicarbonate.  Despite this potassium elevated to 6.6 the following morning. Plan: Continue maintenance IV fluids Repeat 10 units IV insulin, 1 amp D50, 1 unit sodium bicarbonate, 1 amp calcium gluconate Continue scheduled 3 times daily Lokelma Continue every 4 hour potassium If potassium does not respond to therapy consider nephrology evaluation  Type 2 diabetes  mellitus with hyperlipidemia (HCC)  Recent A1c 13.2, poorly controlled.   Patient is taking NovoLog and Levemir 10 units twice daily -SSI -Semglee 7 units twice daily.  Escalate as needed   History of stroke -Aspirin, Lipitor   Seizure ([place]) -Seizure precaution -As needed Ativan for seizure -Continue home Depakote   Anxiety and depression -Continue home medications   HLD (hyperlipidemia) -Lipitor   Tobacco dependence -Nicotine patch   Chronic diastolic CHF (congestive heart failure) ([place])  2D echo on 10/29/2021 showed EF of 55 to 60% with grade 1 diastolic dysfunction.   Patient does not have leg edema JVD.  CHF is compensated.    HTN: -diltiazem -prn IV hydralazine   Protein-calorie malnutrition, moderate  Body weight 54.9 kg, BMI 23.63 -Continue home nutrition supplement  DVT prophylaxis: SQ Lovenox Code Status: Full Family Communication: None Disposition Plan: Status is: Inpatient Remains inpatient appropriate because: Severe hyperkalemia   Level of care: Med-Surg  Consultants:  None  Procedures:  None  Antimicrobials: None   Subjective: Seen and examined.  Sleeping but easily awakened.  Perseverates on wanting eggs.  Explained that eggs are in her breakfast today.  Objective: Vitals:   12/28/21 1841 12/28/21 1928 12/29/21 0417 12/29/21 0759  BP: (!) 145/95 (!) 140/96 127/89 93/69  Pulse: 89 88 87 95  Resp: '16 20 20 17  '$ Temp: (!) 97.5 F (36.4 C) 98.1 F (36.7 C) 98.2 F (36.8 C) (!) 97.5 F (36.4 C)  TempSrc: Oral Oral Oral Oral  SpO2: 100% 98% 96% 100%  Weight:      Height:        Intake/Output Summary (Last 24 hours) at  12/29/2021 1250 Last data filed at 12/29/2021 2876 Gross per 24 hour  Intake 611.7 ml  Output 150 ml  Net 461.7 ml   Filed Weights   12/28/21 1512  Weight: 54.9 kg    Examination:  General exam: NAD.  Appears frail and chronically ill Respiratory system: Clear to auscultation. Respiratory effort  normal. Cardiovascular system: S1-S2, RRR, no murmurs, no pedal edema Gastrointestinal system: Soft, NT/ND, normal bowel sounds Central nervous system: Alert.  Oriented x2.  No focal deficits Extremities: Symmetric 5 x 5 power. Skin: No rashes, lesions or ulcers Psychiatry: Judgement and insight appear impaired. Mood & affect flattened.     Data Reviewed: I have personally reviewed following labs and imaging studies  CBC: Recent Labs  Lab 12/28/21 1514  WBC 7.5  HGB 11.2*  HCT 35.9*  MCV 88.9  PLT 811   Basic Metabolic Panel: Recent Labs  Lab 12/28/21 1514 12/28/21 1907 12/28/21 1908 12/29/21 0436 12/29/21 1015 12/29/21 1144  NA 137  --   --  140  --   --   K 6.1* 5.9*  --  6.6* 5.3* 5.9*  CL 112*  --   --  113*  --   --   CO2 20*  --   --  21*  --   --   GLUCOSE 220*  --   --  206*  --   --   BUN 15  --   --  13  --   --   CREATININE 1.00  --   --  0.78  --   --   CALCIUM 8.9  --   --  8.6*  --   --   MG  --   --  2.0  --   --   --   PHOS  --   --  3.8  --   --   --    GFR: Estimated Creatinine Clearance: 57.5 mL/min (by C-G formula based on SCr of 0.78 mg/dL). Liver Function Tests: No results for input(s): "AST", "ALT", "ALKPHOS", "BILITOT", "PROT", "ALBUMIN" in the last 168 hours. No results for input(s): "LIPASE", "AMYLASE" in the last 168 hours. No results for input(s): "AMMONIA" in the last 168 hours. Coagulation Profile: No results for input(s): "INR", "PROTIME" in the last 168 hours. Cardiac Enzymes: No results for input(s): "CKTOTAL", "CKMB", "CKMBINDEX", "TROPONINI" in the last 168 hours. BNP (last 3 results) No results for input(s): "PROBNP" in the last 8760 hours. HbA1C: No results for input(s): "HGBA1C" in the last 72 hours. CBG: Recent Labs  Lab 12/28/21 1833 12/28/21 2103 12/29/21 0800 12/29/21 1103  GLUCAP 213* 205* 229* 144*   Lipid Profile: No results for input(s): "CHOL", "HDL", "LDLCALC", "TRIG", "CHOLHDL", "LDLDIRECT" in the last  72 hours. Thyroid Function Tests: No results for input(s): "TSH", "T4TOTAL", "FREET4", "T3FREE", "THYROIDAB" in the last 72 hours. Anemia Panel: No results for input(s): "VITAMINB12", "FOLATE", "FERRITIN", "TIBC", "IRON", "RETICCTPCT" in the last 72 hours. Sepsis Labs: No results for input(s): "PROCALCITON", "LATICACIDVEN" in the last 168 hours.  No results found for this or any previous visit (from the past 240 hour(s)).       Radiology Studies: No results found.      Scheduled Meds:  aspirin EC  81 mg Oral Daily   atorvastatin  40 mg Oral QHS   buPROPion  75 mg Oral BID   diltiazem  120 mg Oral Daily   divalproex  500 mg Oral BID   enoxaparin (LOVENOX) injection  40  mg Subcutaneous Q24H   famotidine  20 mg Oral Daily   feeding supplement (GLUCERNA SHAKE)  237 mL Oral TID BM   insulin aspart  0-5 Units Subcutaneous QHS   insulin aspart  0-9 Units Subcutaneous TID WC   insulin glargine-yfgn  7 Units Subcutaneous BID   melatonin  2.5 mg Oral QHS   mirtazapine  15 mg Oral QHS   multivitamin with minerals  1 tablet Oral Daily   nicotine  21 mg Transdermal Daily   QUEtiapine  50 mg Oral QHS   rOPINIRole  0.5 mg Oral QHS   sodium zirconium cyclosilicate  10 g Oral TID BM   zinc sulfate  220 mg Oral Daily   Continuous Infusions:  sodium chloride 75 mL/hr at 12/29/21 0616     LOS: 0 days      Sidney Ace, MD [facility]   If 7PM-7AM, please contact night-coverage  12/29/2021, 12:50 PM

## 2021-12-30 DIAGNOSIS — E875 Hyperkalemia: Secondary | ICD-10-CM | POA: Diagnosis not present

## 2021-12-30 DIAGNOSIS — I10 Essential (primary) hypertension: Secondary | ICD-10-CM

## 2021-12-30 DIAGNOSIS — E1169 Type 2 diabetes mellitus with other specified complication: Secondary | ICD-10-CM | POA: Diagnosis not present

## 2021-12-30 DIAGNOSIS — I5032 Chronic diastolic (congestive) heart failure: Secondary | ICD-10-CM | POA: Diagnosis not present

## 2021-12-30 LAB — POTASSIUM
Potassium: 4.3 mmol/L (ref 3.5–5.1)
Potassium: 4.5 mmol/L (ref 3.5–5.1)
Potassium: 4.7 mmol/L (ref 3.5–5.1)

## 2021-12-30 LAB — GLUCOSE, CAPILLARY
Glucose-Capillary: 257 mg/dL — ABNORMAL HIGH (ref 70–99)
Glucose-Capillary: 187 mg/dL — ABNORMAL HIGH (ref 70–99)

## 2021-12-30 MED ORDER — CLONAZEPAM 0.5 MG PO TABS: 0.5000 mg | ORAL_TABLET | Freq: Two times a day (BID) | ORAL | 0 refills | Status: DC | PRN

## 2021-12-30 MED ORDER — QUETIAPINE FUMARATE 25 MG PO TABS: 50.0000 mg | ORAL_TABLET | Freq: Every day | ORAL | Status: DC

## 2021-12-30 MED ORDER — NICOTINE 21 MG/24HR TD PT24: 21.0000 mg | MEDICATED_PATCH | Freq: Every day | TRANSDERMAL | 0 refills | Status: DC | PRN

## 2021-12-30 NOTE — Discharge Summary (Signed)
Physician Discharge Summary   [name] MRN: [id number] DOB: [birth year]  Admit date:     12/28/2021  Discharge date: 12/30/21  Discharge Physician: Annita Brod   PCP: [name], NP   Recommendations at discharge:   Patient returning back to skilled nursing  Discharge Diagnoses: Principal Problem:   Hyperkalemia Active Problems:   Type 2 diabetes mellitus with hyperlipidemia (HCC)   History of stroke   Seizure ([place])   Anxiety and depression   HLD (hyperlipidemia)   Tobacco dependence   Chronic diastolic CHF (congestive heart failure) (HCC)   HTN (hypertension)   Protein-calorie malnutrition, moderate ([place])  Resolved Problems:   * No resolved hospital problems. *  Hospital Course: 61 y.o. 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 difficile colitis, dCHF, who presented to the emergency department on 11/16 with abnormal lab with hyperkalemia, specifically a potassium of 6.1.  Noted to have peaked T waves on EKG.  On arrival received insulin 10 units IV, dextrose, sodium bicarbonate, calcium gluconate.  Also received Lokelma.  Kidney function normal.  Assessment and Plan: Hyperkalemia:  Serum potassium 6.1 on arrival.  EKG with T wave peaking.  Patient is asymptomatic.  No chest pain.  Kidney function normal.  Received calcium gluconate, D50, IV insulin, Lokelma, sodium bicarbonate.  Despite this potassium elevated to 6.6 the following morning.  Patient given another 10 units IV insulin, 1 amp D50, 1 unit sodium bicarbonate, 1 amp calcium gluconate and continued on Lokelma 3 times daily.  Serial potassiums then started trending downward and by day of discharge, at 4.7.   Type 2 diabetes mellitus with hyperlipidemia (HCC)  Recent A1c 13.2, poorly controlled.   Patient is taking  NovoLog and Levemir 10 units twice daily -SSI -Semglee 7 units twice daily.  Escalate as needed   History of stroke -Aspirin, Lipitor   Seizure ([place]) -Seizure precaution -As needed Ativan for seizure -Continue home Depakote   Anxiety and depression -Continue home medications   HLD (hyperlipidemia) -Lipitor   Tobacco dependence -Nicotine patch   Chronic diastolic CHF (congestive heart failure) ([place])  2D echo on 10/29/2021 showed EF of 55 to 60% with grade 1 diastolic dysfunction.   Patient does not have leg edema JVD.  CHF is compensated.   HTN: -diltiazem -prn IV hydralazine   Protein-calorie malnutrition, moderate  Body weight 54.9 kg, BMI 23.63 -Continue home nutrition supplement       Consultants: None Procedures performed: None Disposition: Skilled nursing facility Diet recommendation:  Discharge Diet Orders (From admission, onward)     Start     Ordered   12/30/21 0000  Diet - low sodium heart healthy        12/30/21 1336           Cardiac and Carb modified diet DISCHARGE MEDICATION: Allergies as of 12/30/2021       Reactions   Cephalosporins Itching   TOLERATED ZOSYN (PIPERACILLIN) BEFORE   Hydromorphone Itching   Keflin [cephalothin] Itching   Lactose Intolerance (gi) Diarrhea        Medication List     TAKE these medications    aspirin EC 81 MG tablet Take 1 tablet (81 mg total) by mouth daily. Swallow whole.   atorvastatin 40 MG tablet Commonly known as: LIPITOR Take 1 tablet (40 mg total) by mouth Nightly. What changed: when to take this  buPROPion 75 MG tablet Commonly known as: WELLBUTRIN Take 75 mg by mouth 2 (two) times daily.   clonazePAM 0.5 MG tablet Commonly known as: KLONOPIN Take 1 tablet (0.5 mg total) by mouth 2 (two) times daily as needed for anxiety.   [employer] Glucose Bits 1 g Chew Chew 1 tablet by mouth daily as needed (hypoglycemia).   diltiazem 120 MG 24 hr capsule Commonly known as: CARDIZEM CD Take 1  capsule (120 mg total) by mouth daily.   divalproex 500 MG DR tablet Commonly known as: DEPAKOTE Take 1 tablet (500 mg total) by mouth 2 (two) times daily.   famotidine 20 MG tablet Commonly known as: PEPCID Take 1 tablet (20 mg total) by mouth daily.   feeding supplement (GLUCERNA SHAKE) Liqd Take 237 mLs by mouth 3 (three) times daily between meals.   insulin aspart 100 UNIT/ML injection Commonly known as: novoLOG Inject 5 Units into the skin 3 (three) times daily with meals. If eats more than 5)% of meals What changed:  how much to take additional instructions   insulin detemir 100 UNIT/ML injection Commonly known as: LEVEMIR Inject 0.08 mLs (8 Units total) into the skin 2 (two) times daily. What changed: how much to take   melatonin 5 MG Tabs Take 0.5 tablets (2.5 mg total) by mouth at bedtime.   metFORMIN 1000 MG tablet Commonly known as: GLUCOPHAGE Take 1,000 mg by mouth 2 (two) times daily.   mirtazapine 15 MG tablet Commonly known as: REMERON Take 15 mg by mouth at bedtime.   multivitamin with minerals Tabs tablet Take 1 tablet by mouth daily.   nicotine 21 mg/24hr patch Commonly known as: NICODERM CQ - dosed in mg/24 hours Place 1 patch (21 mg total) onto the skin daily as needed (nicotine craving).   QUEtiapine 25 MG tablet Commonly known as: SEROQUEL Take 2 tablets (50 mg total) by mouth at bedtime.   rOPINIRole 0.5 MG tablet Commonly known as: REQUIP Take 0.5 mg by mouth at bedtime.   SPS 15 GM/60ML suspension Generic drug: sodium polystyrene Take by mouth.   zinc sulfate 220 (50 Zn) MG capsule Take 1 capsule (220 mg total) by mouth daily.        Discharge Exam: Filed Weights   12/28/21 1512  Weight: 54.9 kg   General: Oriented x2, no acute distress CV: Regular rate and rhythm, S1-S2 Lungs: Clear to auscultation bilaterally  Condition at discharge: good  The results of significant diagnostics from this hospitalization (including  imaging, microbiology, ancillary and laboratory) are listed below for reference.   Imaging Studies: DG Knee Complete 4 Views Right  Result Date: 12/10/2021 CLINICAL DATA:  Fall. Per Triage: Pt in via EMS with c/o low glucose. Pt was cbg 50 upon EMS arrival and now at 168Pt was given 400 D10 and 2 squirts of glucagon. Pt is weak. 103/68, HR 70, 98% RA EXAM: RIGHT KNEE - COMPLETE 4+ VIEW COMPARISON:  None Available. FINDINGS: No evidence of fracture, dislocation, or joint effusion. No evidence of arthropathy or other focal bone abnormality. Soft tissues are unremarkable. IMPRESSION: Negative. Electronically Signed   By: Lajean Manes M.D.   On: 12/10/2021 12:47   DG Chest 2 View  Result Date: 12/10/2021 CLINICAL DATA:  Per Triage: Pt in via EMS with c/o low glucose. Pt was cbg 50 upon EMS arrival and now at 168Pt was given 400 D10 and 2 squirts of glucagon. Pt is weak. 103/68, HR 70, 98% RA EXAM: CHEST - 2 VIEW  COMPARISON:  10/28/2021.  CT, 07/08/2021. FINDINGS: Right greater than left lung base opacities, similar to the prior radiographs, consistent with atelectasis. Remainder of the lungs is clear. No pleural effusion.  No pneumothorax. Cardiac silhouette is normal in size. No mediastinal or hilar masses. No evidence of adenopathy. Skeletal structures are intact. Stable left upper quadrant surgical vascular clips. IMPRESSION: 1. No acute cardiopulmonary disease. 2. Persistent, right greater than left, lung base opacities suspected to be scarring and/or atelectasis given the stability. Electronically Signed   By: Lajean Manes M.D.   On: 12/10/2021 12:47   DG Shoulder Right  Result Date: 12/10/2021 CLINICAL DATA:  Fall. Per Triage: Pt in via EMS with c/o low glucose. Pt was cbg 50 upon EMS arrival and now at 168Pt was given 400 D10 and 2 squirts of glucagon. Pt is weak. 103/68, HR 70, 98% RA EXAM: RIGHT SHOULDER - 2+ VIEW COMPARISON:  None Available. FINDINGS: No fracture.  No bone lesion.  Glenohumeral and AC joints are normally aligned Soft tissues are unremarkable. IMPRESSION: No fracture or dislocation. Electronically Signed   By: Lajean Manes M.D.   On: 12/10/2021 12:45   CT HEAD WO CONTRAST (5MM)  Result Date: 12/10/2021 CLINICAL DATA:  Head trauma. EXAM: CT HEAD WITHOUT CONTRAST TECHNIQUE: Contiguous axial images were obtained from the base of the skull through the vertex without intravenous contrast. RADIATION DOSE REDUCTION: This exam was performed according to the departmental dose-optimization program which includes automated exposure control, adjustment of the mA and/or kV according to patient size and/or use of iterative reconstruction technique. COMPARISON:  10/28/2021 FINDINGS: Brain: Stable posterior left MCA territory infarct. Small infarct right frontal region is chronic and unchanged in the interval. Diffuse loss of parenchymal volume is consistent with atrophy. Patchy low attenuation in the deep hemispheric and periventricular white matter is nonspecific, but likely reflects chronic microvascular ischemic demyelination. Lacunar infarctions seen in the basal ganglia bilaterally. Stable left cerebellar infarct. Vascular: No hyperdense vessel or unexpected calcification. Skull: Normal. Negative for fracture or focal lesion. Sinuses/Orbits: The visualized paranasal sinuses and mastoid air cells are clear. Visualized portions of the globes and intraorbital fat are unremarkable. Other: None. IMPRESSION: 1. No acute intracranial abnormality. 2. Stable posterior right frontal and left posterior MCA territory infarcts. 3. Stable left cerebellar infarct. 4. Atrophy with chronic small vessel ischemic disease. Electronically Signed   By: Misty Stanley M.D.   On: 12/10/2021 12:09   CT Cervical Spine Wo Contrast  Result Date: 12/10/2021 CLINICAL DATA:  Cervical trauma. EXAM: CT CERVICAL SPINE WITHOUT CONTRAST TECHNIQUE: Multidetector CT imaging of the cervical spine was performed  without intravenous contrast. Multiplanar CT image reconstructions were also generated. RADIATION DOSE REDUCTION: This exam was performed according to the departmental dose-optimization program which includes automated exposure control, adjustment of the mA and/or kV according to patient size and/or use of iterative reconstruction technique. COMPARISON:  06/25/2021 FINDINGS: Alignment: Reversal of normal cervical lordosis. Trace anterolisthesis C4 on 5. Skull base and vertebrae: No acute fracture. No primary bone lesion or focal pathologic process. Soft tissues and spinal canal: No prevertebral fluid or swelling. No visible canal hematoma. Disc levels: Loss of disc height noted C5-6 and C6-7. Facet osteoarthritis noted upper cervical levels. Upper chest: Centrilobular and paraseptal emphysema evident. Other: None. IMPRESSION: 1. No cervical spine fracture. 2. Degenerative changes in the cervical spine as above. 3.  Emphysema (ICD10-J43.9). Electronically Signed   By: Misty Stanley M.D.   On: 12/10/2021 12:07    Microbiology: Results  for orders placed or performed during the hospital encounter of 12/10/21  Urine Culture     Status: Abnormal   Collection Time: 12/10/21  2:49 PM   Specimen: Urine, Clean Catch  Result Value Ref Range Status   Specimen Description   Final    URINE, CLEAN CATCH Performed at [facility], [address], [place], [place] [address]    Special Requests   Final    NONE Performed at [facility], [address], [place], [place] [address]    Culture (A)  Final    <10,000 COLONIES/mL INSIGNIFICANT GROWTH Performed at [facility] [address], [place], [place] [address]    Report Status 12/11/2021 FINAL  Final    Labs: CBC: Recent Labs  Lab 12/28/21 1514  WBC 7.5  HGB 11.2*  HCT 35.9*  MCV 88.9  PLT 517   Basic Metabolic Panel: Recent Labs  Lab 12/28/21 1514 12/28/21 1907 12/28/21 1908 12/29/21 0436 12/29/21 1015  12/29/21 1851 12/29/21 2227 12/30/21 0229 12/30/21 0628 12/30/21 1035  NA 137  --   --  140  --   --   --   --   --   --   K 6.1*   < >  --  6.6*   < > 5.2* 5.6* 4.3 4.5 4.7  CL 112*  --   --  113*  --   --   --   --   --   --   CO2 20*  --   --  21*  --   --   --   --   --   --   GLUCOSE 220*  --   --  206*  --   --   --   --   --   --   BUN 15  --   --  13  --   --   --   --   --   --   CREATININE 1.00  --   --  0.78  --   --   --   --   --   --   CALCIUM 8.9  --   --  8.6*  --   --   --   --   --   --   MG  --   --  2.0  --   --   --   --   --   --   --   PHOS  --   --  3.8  --   --   --   --   --   --   --    < > = values in this interval not displayed.   Liver Function Tests: No results for input(s): "AST", "ALT", "ALKPHOS", "BILITOT", "PROT", "ALBUMIN" in the last 168 hours. CBG: Recent Labs  Lab 12/29/21 1605 12/29/21 2134 12/29/21 2158 12/30/21 0737 12/30/21 1149  GLUCAP 233* 451* 416* 257* 187*    Discharge time spent: less than 30 minutes.  Signed: Annita Brod, MD [facility] 12/30/2021
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
## 2021-12-30 NOTE — TOC Transition Note (Addendum)
Transition of Care [facility]) - CM/SW Discharge Note   Patient Details  Name: Brittany [name] MRN: [id number] Date of Birth: 1960-03-08  Transition of Care [facility]) CM/SW Contact:  Izola Price, RN Phone Number: 12/30/2021, 9:27 AM   Clinical Narrative:  12/30/21: Discharging today back to [facility]  ALF from where patient was admitted to [facility] from. Returning to Room 112 via [facility] when ready, and forms printed to unit. FL2 and DC Summary to be faxed to facility when ready by this RN CM. DSS aware of discharge and transfer back to facility per prior CM note of 12/30/21. Simmie Davies RN CM    150 pm DC Summary and FL2 faxed to facility at [phone]. EMS documents sent to unit earlier. [facility] called for transport back to [facility], may be a bit due to only one convalescent truck running on weekends.  Simmie Davies RN CM  (DSS) Allen-Bird,Sentrell (Other)  [phone] (Mobile)   Final next level of care: Assisted Living Barriers to Discharge: Barriers Resolved   Patient Goals and CMS Choice        Discharge Placement                Patient to be transferred to facility by: [facility]   Patient and family notified of of transfer:  (DSS supervisor notified on 12/29/21 per prior CM note.)  Discharge Plan and Services                DME Arranged: N/A DME Agency: NA       HH Arranged: NA [place] Agency:  (Current with [facility] at [facility] facility.)        Social Determinants of Health (SDOH) Interventions     Readmission Risk Interventions    12/29/2021    1:48 PM 09/22/2021    1:24 PM 03/06/2021    9:13 AM  Readmission Risk Prevention Plan  Transportation Screening Complete Complete Complete  Medication Review Press photographer) Complete Complete Complete  PCP or Specialist appointment within 3-5 days of discharge  Complete Complete  HRI or [place] Complete Complete   SW Recovery Care/Counseling Consult Complete Complete   Palliative Care Screening Not Applicable  Not Applicable Not [place] Not Applicable Complete Not Applicable

## 2021-12-30 NOTE — TOC Progression Note (Signed)
Transition of Care [facility]) - Progression Note    Patient Details  Name: Brittany [name] MRN: [id number] Date of Birth: 11-18-1960  Transition of Care [facility]) CM/SW Contact  Izola Price, RN Phone Number: 12/30/2021, 9:20 AM  Clinical Narrative: 11/18: Potential discharge today per provider. Per prior CM note, patient is currently under APS protective order attempting to obtain guardianship and no information is to be given to family. [facility]F, confirmed discharge, readmission to facility, and patient will return to Room 112.  FL2 and DC Summary will be faxed to [phone] when ready. Patient will require [facility] transport due to no facility transport provided over weekends. Patient is current with [facility] for [facility] services and agency is aware of patient hospitalization. Main contact person at [place] is [place] which is listed in contact information as well. Discharge was discussed per prior Cm note of 12/29/21 with the DSS supervisor.  Simmie Davies RN CM    APS Protective Order Contact:  (DSS) Allen-Bird,Sentrell (Other)  [phone] (Mobile)        Expected Discharge Plan and Services                                                 Social Determinants of Health (SDOH) Interventions    Readmission Risk Interventions    12/29/2021    1:48 PM 09/22/2021    1:24 PM 03/06/2021    9:13 AM  Readmission Risk Prevention Plan  Transportation Screening Complete Complete Complete  Medication Review Press photographer) Complete Complete Complete  PCP or Specialist appointment within 3-5 days of discharge  Complete Complete  HRI or Home Care Consult Complete Complete   SW Recovery Care/Counseling Consult Complete Complete   Palliative Care Screening Not Applicable Not Applicable Not [place] Not Applicable Complete Not Applicable

## 2021-12-30 NOTE — Progress Notes (Signed)
Brittany [name] to be D/C'd Brittany [name] per MD order.  Discussed with the patient and all questions fully answered.  VSS, Skin clean, dry and intact without evidence of skin break down, no evidence of skin tears noted. IV catheter discontinued intact. Site without signs and symptoms of complications. Dressing and pressure applied.  An After Visit Summary was printed and given to the patient. Patient received prescription.  D/c education completed with patient/family including follow up instructions, medication list, d/c activities limitations if indicated, with other d/c instructions as indicated by MD - patient able to verbalize understanding, all questions fully answered.   Patient instructed to return to ED, call 911, or call MD for any changes in condition.   Patient escorted via stretcher, and D/C home via EMS.  Eugune Sine L Rawn Quiroa 12/30/2021 3:00 PM

## 2022-01-01 ENCOUNTER — Inpatient Hospital Stay: Payer: Medicare Other | Admitting: Neurology

## 2022-01-23 ENCOUNTER — Inpatient Hospital Stay: Payer: Medicare Other | Admitting: Neurology

## 2022-01-23 ENCOUNTER — Encounter: Payer: Self-pay | Admitting: Neurology

## 2022-02-02 IMAGING — CT CT ABD-PELV W/ CM
2 of 5 series · 13 of 46 positions shown, 15 images · IV contrast (APPLIED)
Comparison: CT abdomen pelvis 02/14/2021, MRI abdomen 02/14/2021

CLINICAL DATA: Peritonitis or perforation suspected Abdominal pain,
acute, nonlocalized

EXAM:
CT ABDOMEN AND PELVIS WITH CONTRAST
TECHNIQUE: Multidetector CT imaging of the abdomen and pelvis was performed
using the standard protocol following bolus administration of
intravenous contrast.

[Series 2: routine abd/pel with · axial · 0.62mm/px · z∈[-1110,-725]mm · 10 of 89 slices shown, 12 images]
[im 6/89  soft-tissue]
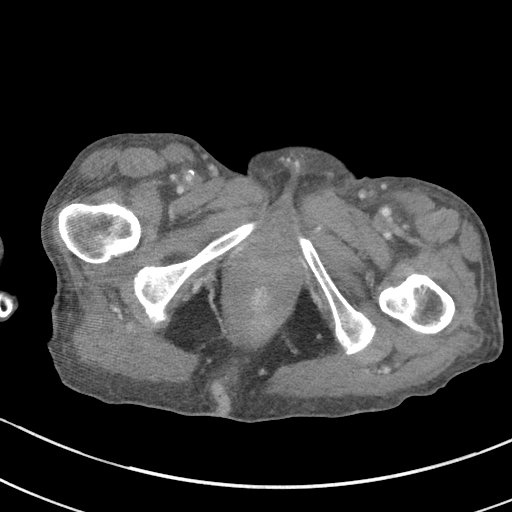
[im 6/89  bone]
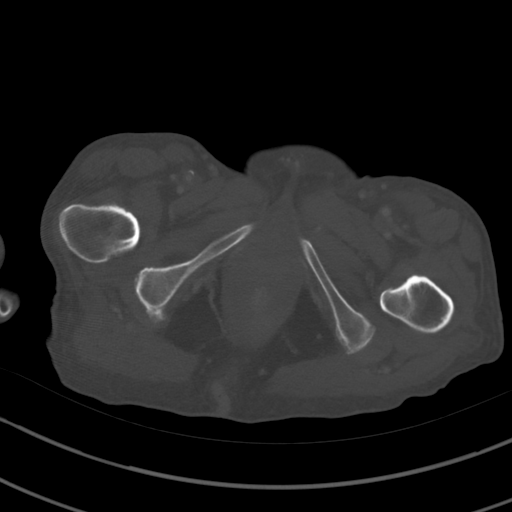
[im 17/89  soft-tissue]
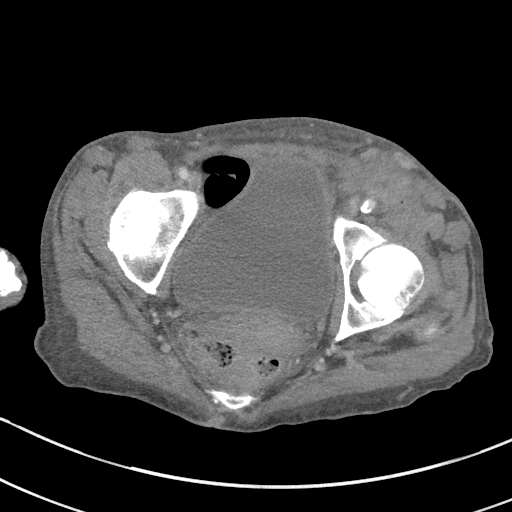
[im 23/89  soft-tissue]
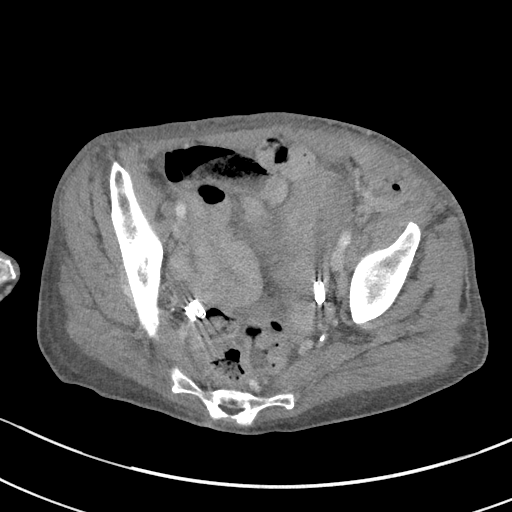
[im 34/89  soft-tissue]
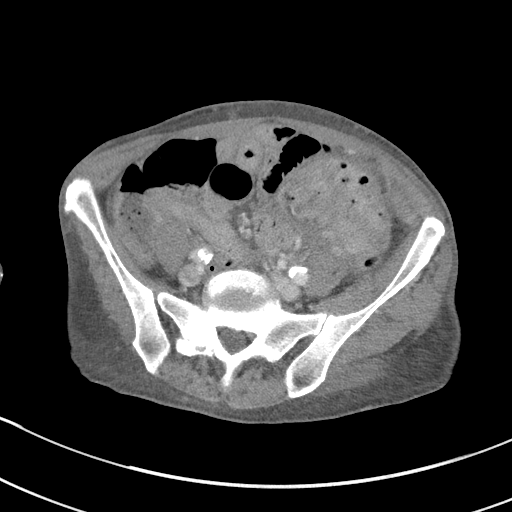
[im 39/89  soft-tissue]
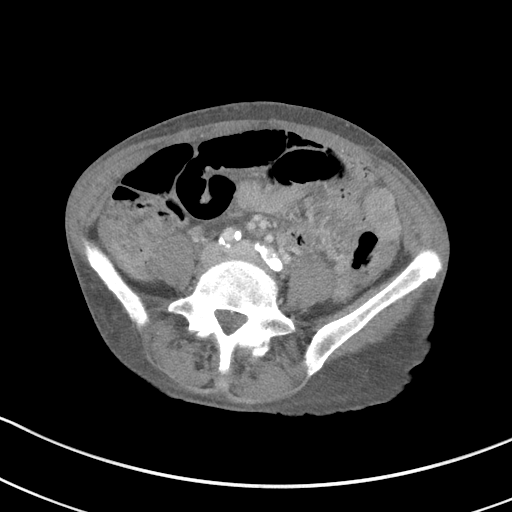
[im 50/89  soft-tissue]
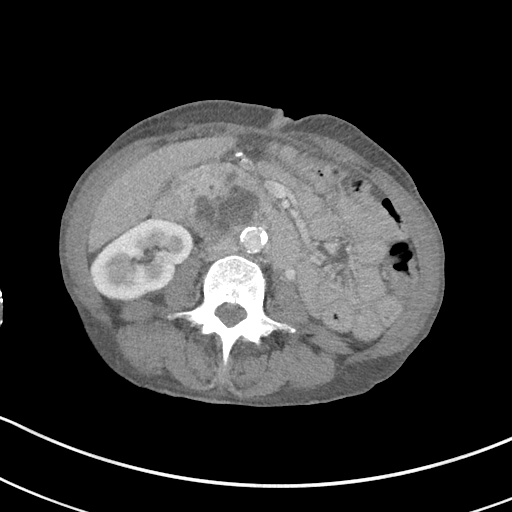
[im 56/89  soft-tissue]
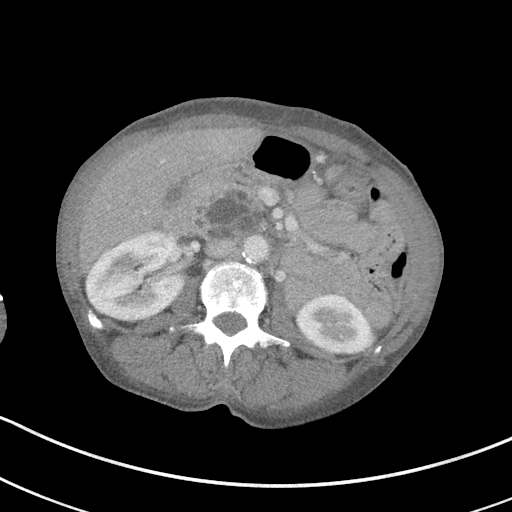
[im 67/89  soft-tissue]
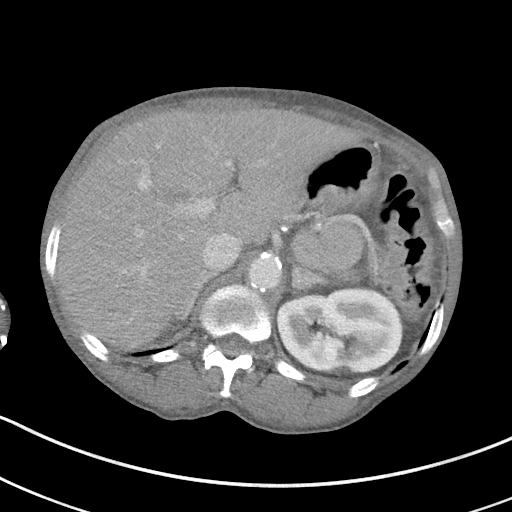
[im 72/89  soft-tissue]
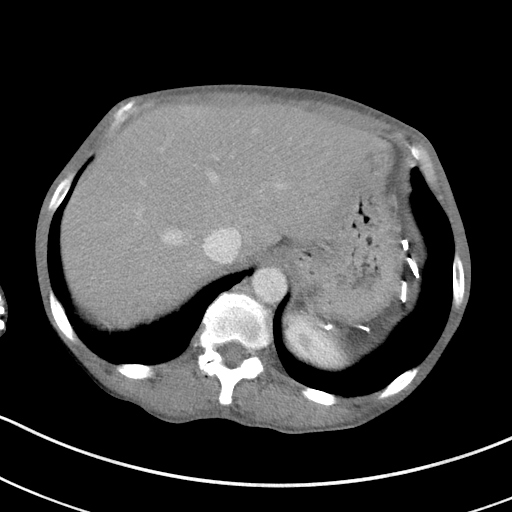
[im 72/89  bone]
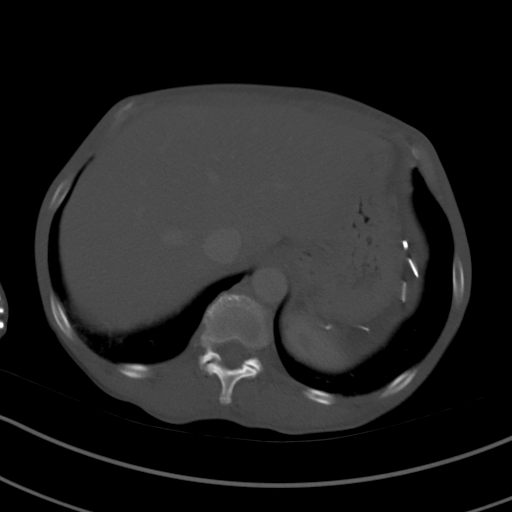
[im 83/89  soft-tissue]
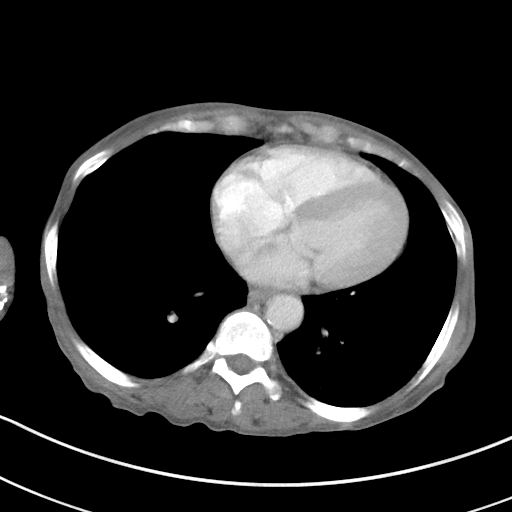

[Series 5: coronal st · coronal · 0.55mm/px · 3 of 73 slices shown]
[im 25/73  soft-tissue]
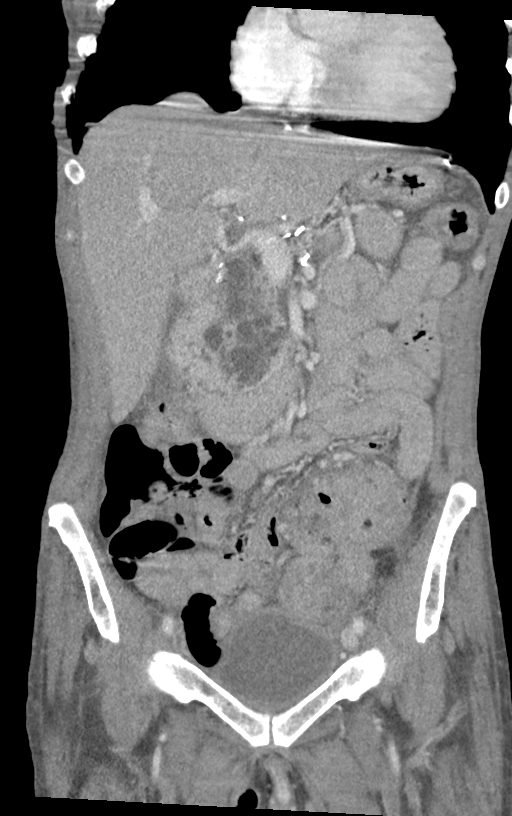
[im 33/73  soft-tissue]
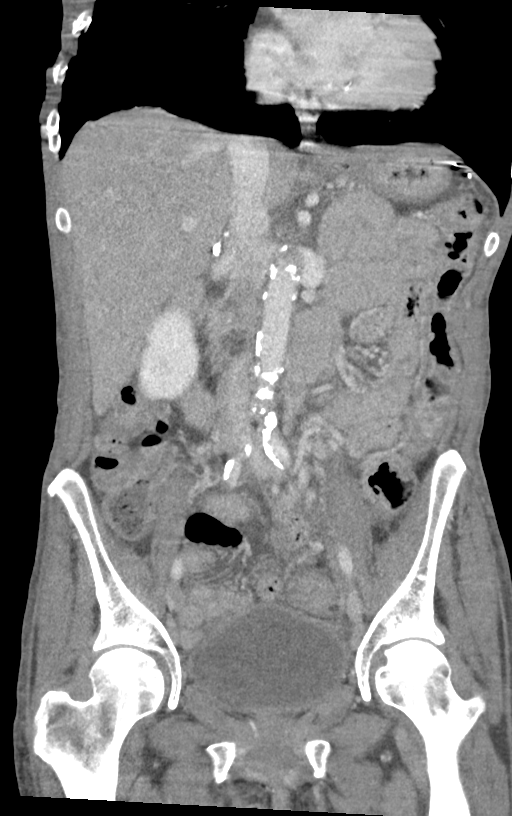
[im 41/73  soft-tissue]
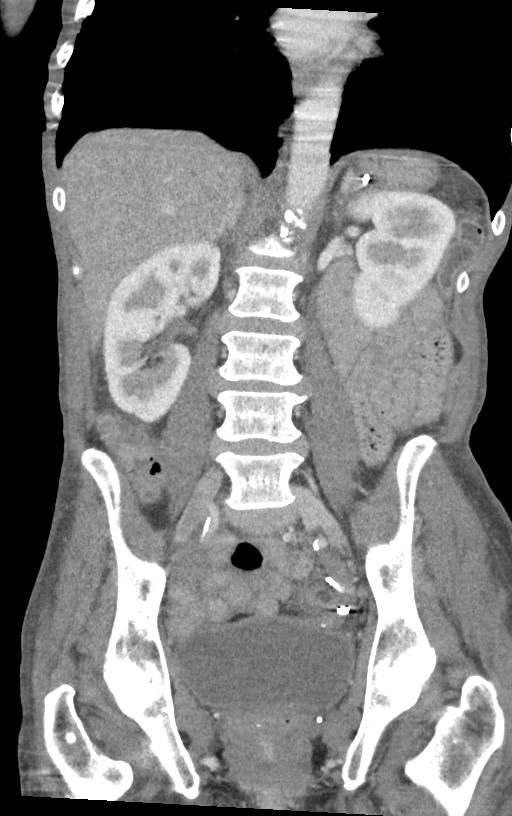

[13 of 46 positions shown; findings below may reference images not displayed]

RADIATION DOSE REDUCTION: This exam was performed according to the
departmental dose-optimization program which includes automated
exposure control, adjustment of the mA and/or kV according to
patient size and/or use of iterative reconstruction technique.

CONTRAST:  75mL OMNIPAQUE IOHEXOL 300 MG/ML  SOLN
FINDINGS: Lower chest: Limited evaluation due to respiratory motion artifact.
Subsegmental atelectasis. No gross acute abnormality.

Hepatobiliary: No focal liver abnormality. Status post
cholecystectomy. No biliary dilatation.

Pancreas: Similar-appearing 4.5 x 3.4 cm multiloculated cystic mass
within the head/uncinate process of the pancreas with associated
main pancreatic duct dilatation distally. Distal pancreas resection.

Spleen: Splenectomy.

Adrenals/Urinary Tract:

No adrenal nodule bilaterally.

Bilateral kidneys enhance symmetrically. Bilateral subcentimeter
hypodensities too small to characterize.

No hydronephrosis. No hydroureter.

The urinary bladder is unremarkable.

On delayed imaging, there is no urothelial wall thickening and there
are no filling defects in the opacified portions of the bilateral
collecting systems or ureters.

Stomach/Bowel: Stomach is within normal limits. No evidence of small
bowel wall thickening or dilatation. Colonic diverticulosis.
Redemonstration of a masslike thickened 7.3 x 5.3 cm the distal
descending/proximal sigmoid ([DATE]) in the setting of underlying
diverticulosis with associated intramural abscess formation
measuring 2.7 x 1.6 x 1.1 cm ([DATE], [DATE]). Irregular bowel
thickening of the rectosigmoid colon distally ([DATE]). Slight
interval increase in size of an adjacent gas and fluid collection
that is peripherally enhancing measuring 3.7 x 2.6 x 6.8 cm (from 3
x 1.7 3.4cm). The abscess appears to be extraperitoneal and then
extend to the abdominal musculature. Appendix appears normal.

Vascular/Lymphatic: Compression of the inferior vena cava due to
pancreatic mass ([DATE]). The main portal and superior mesenteric
veins are patent. No abdominal aorta or iliac aneurysm. Severe
atherosclerotic plaque of the aorta and its branches. No abdominal,
pelvic, or inguinal lymphadenopathy.

Reproductive: Uterus and bilateral adnexa are unremarkable.

Other: No intraperitoneal free fluid. No intraperitoneal free gas.

Musculoskeletal:

Subcutaneus soft tissue edema.

No suspicious lytic or blastic osseous lesions. No acute displaced
fracture.
IMPRESSION: 1. Redemonstration of a masslike thickened 7.3 x 5.3 cm the distal
descending/proximal sigmoid in the setting of underlying
diverticulosis with associated persistent 2.7 x 1.6 x 1.1 cm
intramural abscess formation consistent with complicated acute
diverticulitis. Persistent irregular bowel thickening of the
rectosigmoid colon distally. An underlying malignancy is not
excluded. Recommend surgical consultation. Recommend colonoscopy
status post treatment and status post complete resolution of
inflammatory changes to exclude an underlying lesion.
2. Interval increase in size of an adjacent 3.7 x 2.6 x 6.8 cm (from
3 x 1.7 x 3.4cm) extraperitoneal abscess formation that appears to
extend into the abdominal musculature.
3. Please consider the use of both PO and IV contrast in short-term
future CT scans.
4. Similar-appearing 4.5 x 3.4 cm multiloculated cystic mass within
the head/uncinate process of the pancreas with associated main
pancreatic duct dilatation distally.
5.  Aortic Atherosclerosis (E6V3G-UST.T).

## 2022-02-03 IMAGING — US US FINE NEEDLE ASP 1ST LESION
2 series · 12 of 12 positions shown · non-contrast
Comparison: none

INDICATION: Abscess, team requested aspiration only

[Series 1: us fine needle asp 1st lesion · 8 of 8 slices shown (1 of 2)]
[im 1/8]
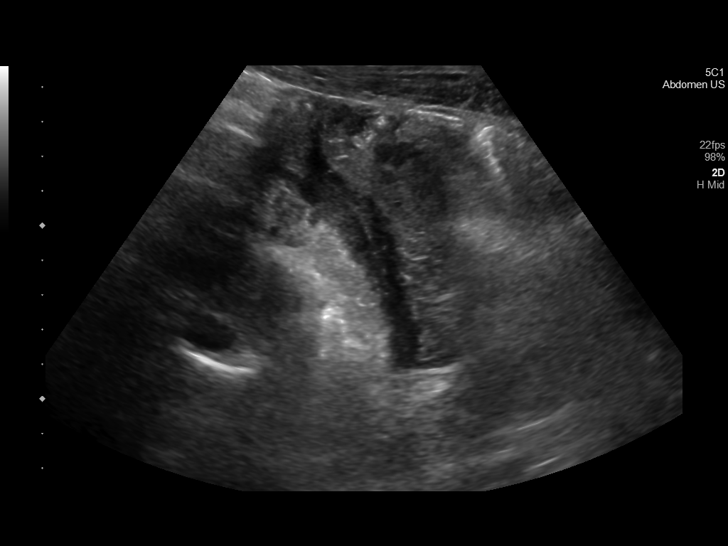
[im 2/8]
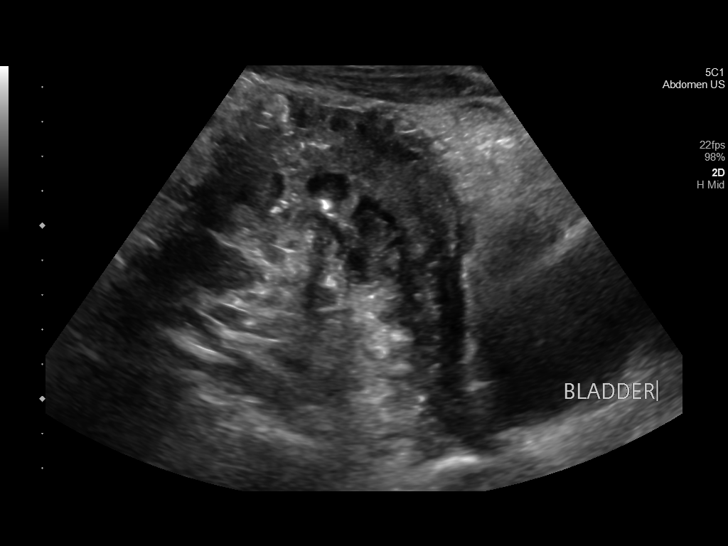
[im 3/8]
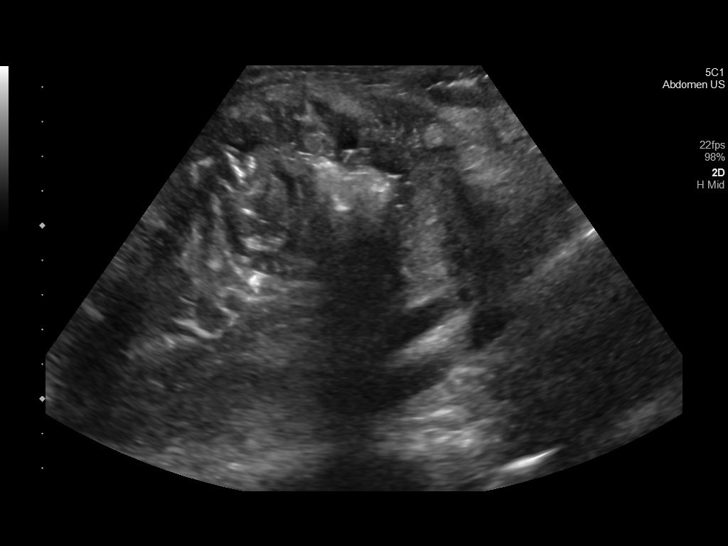
[im 4/8]
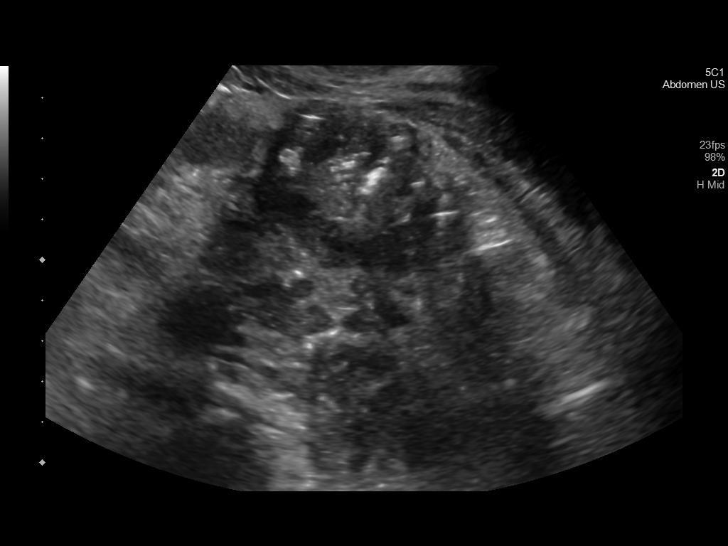
[im 5/8]
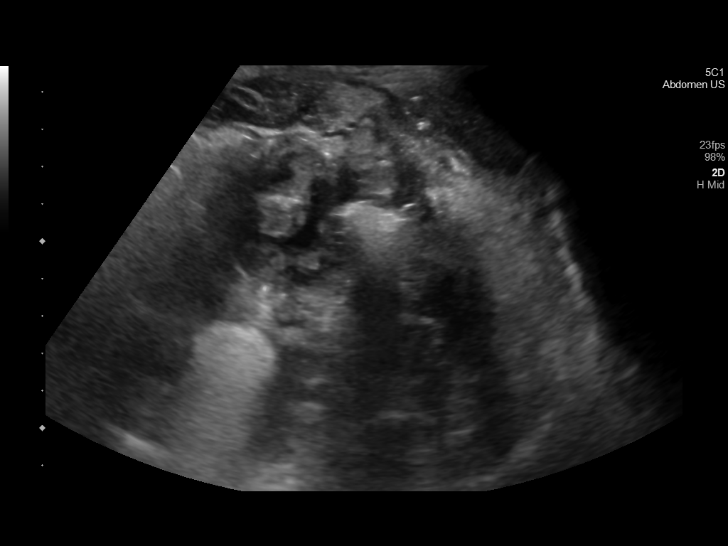
[im 6/8]
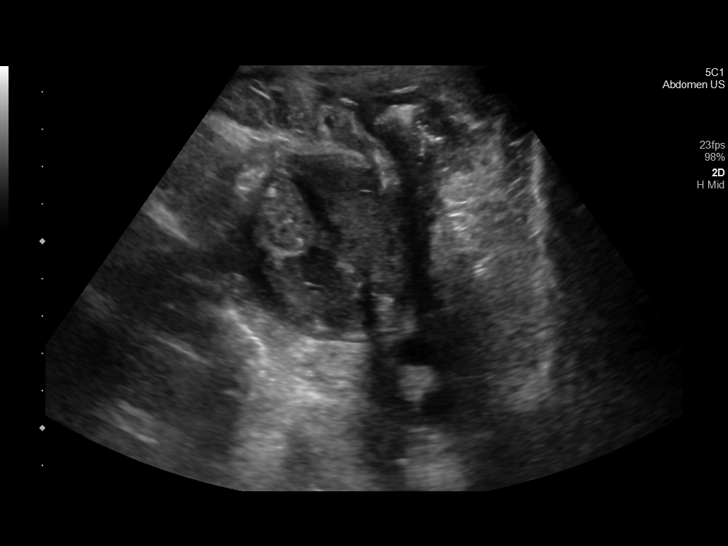
[im 7/8]
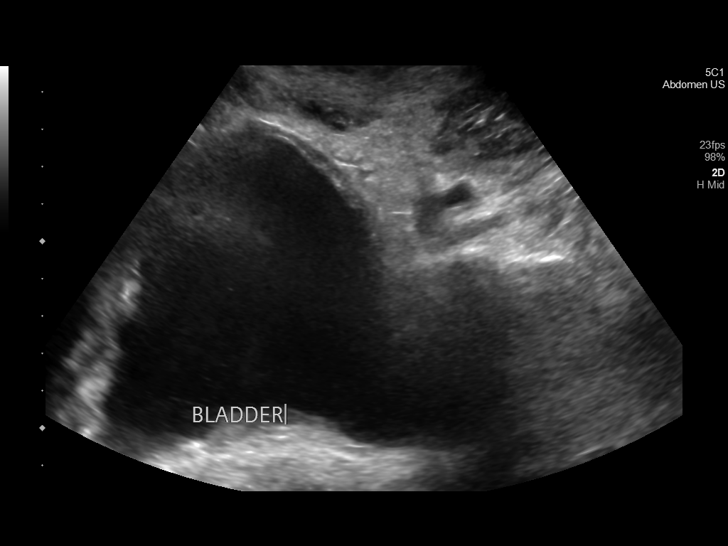
[im 8/8]
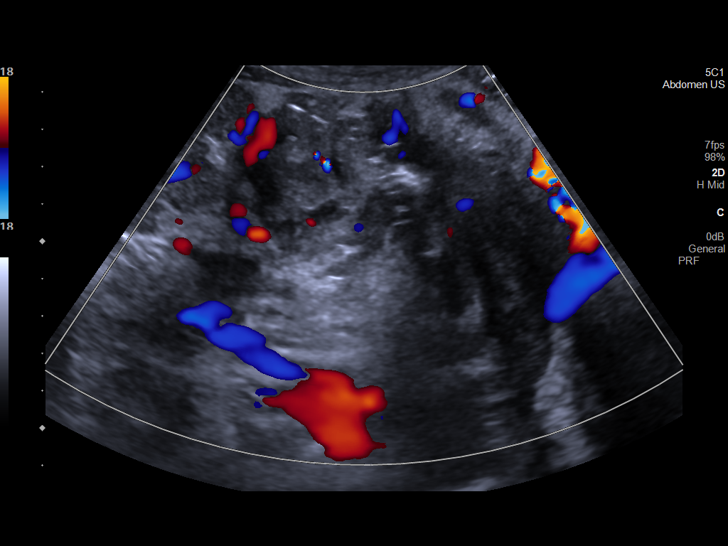

[Series 1001: us fine needle asp 1st lesion · 4 of 4 slices shown (2 of 2)]
[im 1/4]
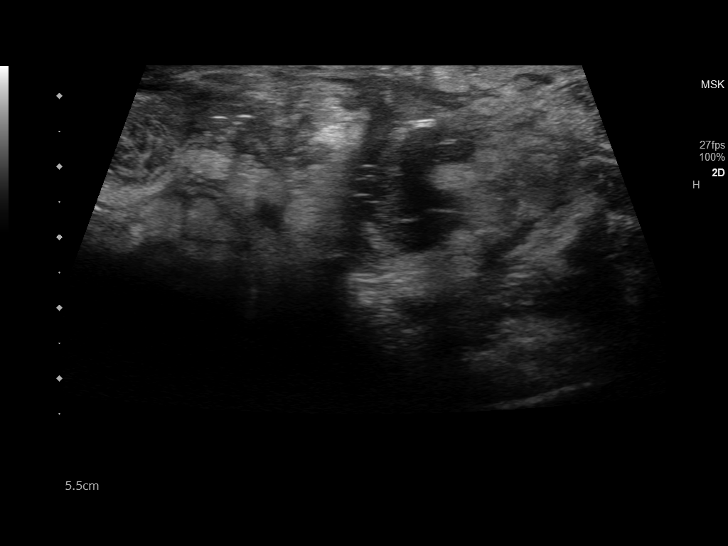
[im 2/4]
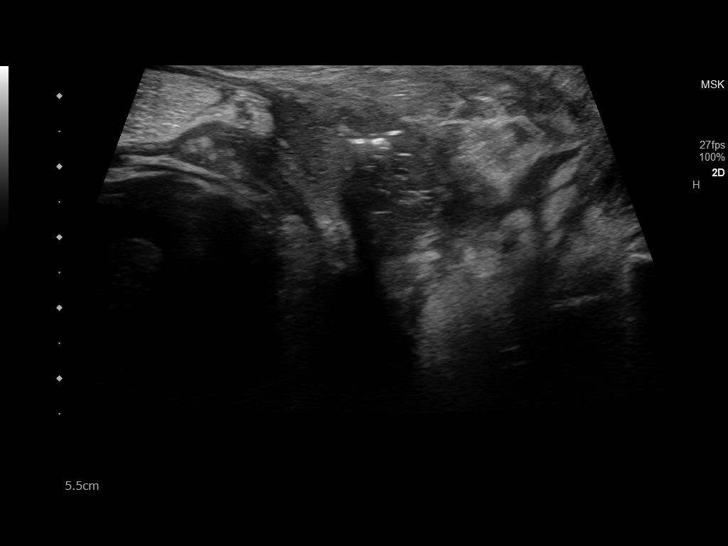
[im 3/4]
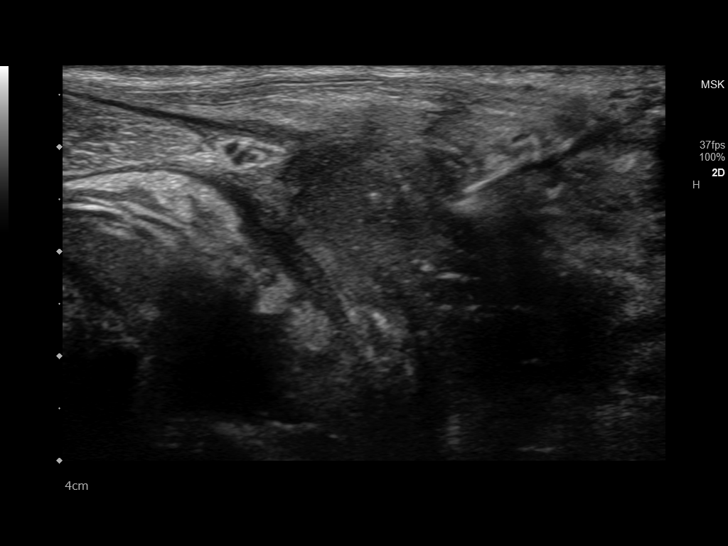
[im 4/4]
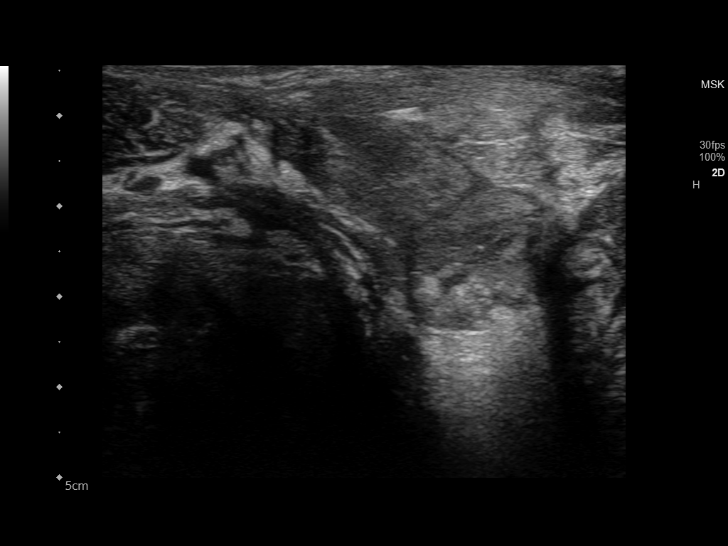

[12 of 12 positions shown; findings below may reference images not displayed]

EXAM:
Ultrasound-guided aspiration of left inguinal abscess

MEDICATIONS:
None.

ANESTHESIA/SEDATION:
Local analgesia

FLUOROSCOPY TIME:  N/a

COMPLICATIONS:
None immediate.

PROCEDURE:
Informed written consent was obtained from the patient after a
thorough discussion of the procedural risks, benefits and
alternatives. All questions were addressed. Maximal Sterile Barrier
Technique was utilized including caps, mask, sterile gowns, sterile
gloves, sterile drape, hand hygiene and skin antiseptic. A timeout
was performed prior to the initiation of the procedure.

The patient was placed supine on the exam table. Limited ultrasound
exam of the left inguinal area demonstrated heterogeneous air and
fluid collection. Skin entry site was marked, overlying skin was
prepped and draped in a standard sterile fashion. Local analgesia
was obtained with 1% lidocaine. Under ultrasound guidance, a 19
gauge Yueh catheter was advanced into the identified air in fluid
collection. Subsequently, 8 mL of purulent material was aspirated.
Samples were sent to the lab for analysis. Postprocedure scanning
demonstrated no significant fluid component with residual
phlegmonous material. Hemostasis was achieved manual pressure, and a
clean dressing was placed. The patient tolerated the procedure well
without immediate complication.
IMPRESSION: Successful ultrasound-guided aspiration of left inguinal abscess
with 8 mL of purulent material collected and sent to the lab for
analysis.

## 2022-02-09 ENCOUNTER — Observation Stay: Payer: Medicare Other

## 2022-02-09 ENCOUNTER — Other Ambulatory Visit: Payer: Self-pay

## 2022-02-09 ENCOUNTER — Inpatient Hospital Stay
Admission: EM | Admit: 2022-02-09 | Discharge: 2022-03-01 | DRG: 640 | Disposition: A | Discharge status: Home Health | Payer: Medicare Other | Source: Skilled Nursing Facility | Attending: Family Medicine | Admitting: Family Medicine

## 2022-02-09 ENCOUNTER — Emergency Department: Payer: Medicare Other

## 2022-02-09 DIAGNOSIS — E875 Hyperkalemia: Principal | ICD-10-CM | POA: Diagnosis present

## 2022-02-09 DIAGNOSIS — I5032 Chronic diastolic (congestive) heart failure: Secondary | ICD-10-CM | POA: Diagnosis not present

## 2022-02-09 DIAGNOSIS — R6510 Systemic inflammatory response syndrome (SIRS) of non-infectious origin without acute organ dysfunction: Secondary | ICD-10-CM | POA: Insufficient documentation

## 2022-02-09 DIAGNOSIS — I959 Hypotension, unspecified: Secondary | ICD-10-CM | POA: Diagnosis present

## 2022-02-09 DIAGNOSIS — G2581 Restless legs syndrome: Secondary | ICD-10-CM | POA: Diagnosis present

## 2022-02-09 DIAGNOSIS — I951 Orthostatic hypotension: Secondary | ICD-10-CM | POA: Diagnosis present

## 2022-02-09 DIAGNOSIS — G40909 Epilepsy, unspecified, not intractable, without status epilepticus: Secondary | ICD-10-CM | POA: Diagnosis present

## 2022-02-09 DIAGNOSIS — F0390 Unspecified dementia without behavioral disturbance: Secondary | ICD-10-CM | POA: Diagnosis present

## 2022-02-09 DIAGNOSIS — E872 Acidosis, unspecified: Secondary | ICD-10-CM | POA: Diagnosis present

## 2022-02-09 DIAGNOSIS — Z79899 Other long term (current) drug therapy: Secondary | ICD-10-CM

## 2022-02-09 DIAGNOSIS — Z803 Family history of malignant neoplasm of breast: Secondary | ICD-10-CM

## 2022-02-09 DIAGNOSIS — F419 Anxiety disorder, unspecified: Secondary | ICD-10-CM

## 2022-02-09 DIAGNOSIS — Z8673 Personal history of transient ischemic attack (TIA), and cerebral infarction without residual deficits: Secondary | ICD-10-CM

## 2022-02-09 DIAGNOSIS — I10 Essential (primary) hypertension: Secondary | ICD-10-CM | POA: Diagnosis present

## 2022-02-09 DIAGNOSIS — Z1152 Encounter for screening for COVID-19: Secondary | ICD-10-CM

## 2022-02-09 DIAGNOSIS — Z781 Physical restraint status: Secondary | ICD-10-CM

## 2022-02-09 DIAGNOSIS — F32A Depression, unspecified: Secondary | ICD-10-CM

## 2022-02-09 DIAGNOSIS — F418 Other specified anxiety disorders: Secondary | ICD-10-CM | POA: Diagnosis present

## 2022-02-09 DIAGNOSIS — N2589 Other disorders resulting from impaired renal tubular function: Secondary | ICD-10-CM | POA: Diagnosis present

## 2022-02-09 DIAGNOSIS — G9341 Metabolic encephalopathy: Secondary | ICD-10-CM | POA: Diagnosis present

## 2022-02-09 DIAGNOSIS — Z794 Long term (current) use of insulin: Secondary | ICD-10-CM

## 2022-02-09 DIAGNOSIS — R569 Unspecified convulsions: Secondary | ICD-10-CM | POA: Diagnosis not present

## 2022-02-09 DIAGNOSIS — F17210 Nicotine dependence, cigarettes, uncomplicated: Secondary | ICD-10-CM | POA: Diagnosis present

## 2022-02-09 DIAGNOSIS — F0394 Unspecified dementia, unspecified severity, with anxiety: Secondary | ICD-10-CM | POA: Diagnosis present

## 2022-02-09 DIAGNOSIS — E162 Hypoglycemia, unspecified: Secondary | ICD-10-CM | POA: Diagnosis present

## 2022-02-09 DIAGNOSIS — E1165 Type 2 diabetes mellitus with hyperglycemia: Secondary | ICD-10-CM | POA: Diagnosis present

## 2022-02-09 DIAGNOSIS — Z933 Colostomy status: Secondary | ICD-10-CM

## 2022-02-09 DIAGNOSIS — Z9081 Acquired absence of spleen: Secondary | ICD-10-CM

## 2022-02-09 DIAGNOSIS — E11649 Type 2 diabetes mellitus with hypoglycemia without coma: Secondary | ICD-10-CM | POA: Diagnosis present

## 2022-02-09 DIAGNOSIS — E860 Dehydration: Secondary | ICD-10-CM | POA: Diagnosis present

## 2022-02-09 DIAGNOSIS — Z8507 Personal history of malignant neoplasm of pancreas: Secondary | ICD-10-CM

## 2022-02-09 DIAGNOSIS — T68XXXA Hypothermia, initial encounter: Secondary | ICD-10-CM | POA: Diagnosis present

## 2022-02-09 DIAGNOSIS — E119 Type 2 diabetes mellitus without complications: Secondary | ICD-10-CM

## 2022-02-09 DIAGNOSIS — F0393 Unspecified dementia, unspecified severity, with mood disturbance: Secondary | ICD-10-CM | POA: Diagnosis present

## 2022-02-09 DIAGNOSIS — F17200 Nicotine dependence, unspecified, uncomplicated: Secondary | ICD-10-CM | POA: Diagnosis present

## 2022-02-09 DIAGNOSIS — E785 Hyperlipidemia, unspecified: Secondary | ICD-10-CM | POA: Diagnosis present

## 2022-02-09 DIAGNOSIS — I110 Hypertensive heart disease with heart failure: Secondary | ICD-10-CM | POA: Diagnosis present

## 2022-02-09 DIAGNOSIS — R7989 Other specified abnormal findings of blood chemistry: Secondary | ICD-10-CM | POA: Diagnosis present

## 2022-02-09 DIAGNOSIS — R680 Hypothermia, not associated with low environmental temperature: Secondary | ICD-10-CM | POA: Diagnosis present

## 2022-02-09 DIAGNOSIS — J449 Chronic obstructive pulmonary disease, unspecified: Secondary | ICD-10-CM | POA: Diagnosis present

## 2022-02-09 DIAGNOSIS — Z818 Family history of other mental and behavioral disorders: Secondary | ICD-10-CM

## 2022-02-09 LAB — URINALYSIS, COMPLETE (UACMP) WITH MICROSCOPIC
Bilirubin Urine: NEGATIVE
Glucose, UA: 50 mg/dL — AB
Hgb urine dipstick: NEGATIVE
Ketones, ur: NEGATIVE mg/dL
Leukocytes,Ua: NEGATIVE
Nitrite: NEGATIVE
Protein, ur: NEGATIVE mg/dL
Specific Gravity, Urine: 1.013 (ref 1.005–1.030)
pH: 5 (ref 5.0–8.0)

## 2022-02-09 LAB — BASIC METABOLIC PANEL
Anion gap: 5 (ref 5–15)
BUN: 14 mg/dL (ref 8–23)
CO2: 21 mmol/L — ABNORMAL LOW (ref 22–32)
Calcium: 8.6 mg/dL — ABNORMAL LOW (ref 8.9–10.3)
Chloride: 115 mmol/L — ABNORMAL HIGH (ref 98–111)
Creatinine, Ser: 0.65 mg/dL (ref 0.44–1.00)
GFR, Estimated: >60 mL/min (ref >60)
Glucose, Bld: 164 mg/dL — ABNORMAL HIGH (ref 70–99)
Potassium: 4.5 mmol/L (ref 3.5–5.1)
Sodium: 141 mmol/L (ref 135–145)

## 2022-02-09 LAB — CBG MONITORING, ED
Glucose-Capillary: 141 mg/dL — ABNORMAL HIGH (ref 70–99)
Glucose-Capillary: 317 mg/dL — ABNORMAL HIGH (ref 70–99)
Glucose-Capillary: 165 mg/dL — ABNORMAL HIGH (ref 70–99)
Glucose-Capillary: 198 mg/dL — ABNORMAL HIGH (ref 70–99)
Glucose-Capillary: 187 mg/dL — ABNORMAL HIGH (ref 70–99)
Glucose-Capillary: 138 mg/dL — ABNORMAL HIGH (ref 70–99)

## 2022-02-09 LAB — CBC
HCT: 31.8 % — ABNORMAL LOW (ref 36.0–46.0)
Hemoglobin: 10 g/dL — ABNORMAL LOW (ref 12.0–15.0)
MCH: 27.9 pg (ref 26.0–34.0)
MCHC: 31.4 g/dL (ref 30.0–36.0)
MCV: 88.6 fL (ref 80.0–100.0)
Platelets: 213 10*3/uL (ref 150–400)
RBC: 3.59 MIL/uL — ABNORMAL LOW (ref 3.87–5.11)
RDW: 20.8 % — ABNORMAL HIGH (ref 11.5–15.5)
WBC: 6.4 10*3/uL (ref 4.0–10.5)
nRBC: 0.3 % — ABNORMAL HIGH (ref 0.0–0.2)

## 2022-02-09 LAB — LACTIC ACID, PLASMA
Lactic Acid, Venous: 2.3 mmol/L (ref 0.5–1.9)
Lactic Acid, Venous: 4.2 mmol/L (ref 0.5–1.9)
Lactic Acid, Venous: 3.7 mmol/L (ref 0.5–1.9)

## 2022-02-09 LAB — APTT: aPTT: 31 seconds (ref 24–36)

## 2022-02-09 LAB — VALPROIC ACID LEVEL: Valproic Acid Lvl: 68 ug/mL (ref 50.0–100.0)

## 2022-02-09 LAB — RESP PANEL BY RT-PCR (FLU A&B, COVID) ARPGX2
Influenza A by PCR: NEGATIVE
Influenza B by PCR: NEGATIVE
SARS Coronavirus 2 by RT PCR: NEGATIVE

## 2022-02-09 LAB — PROTIME-INR
INR: 1.2 (ref 0.8–1.2)
Prothrombin Time: 14.8 seconds (ref 11.4–15.2)

## 2022-02-09 LAB — PROCALCITONIN: Procalcitonin: <0.1 ng/mL

## 2022-02-09 LAB — BRAIN NATRIURETIC PEPTIDE: B Natriuretic Peptide: 40 pg/mL (ref 0.0–100.0)

## 2022-02-09 MED ORDER — LEVETIRACETAM IN NACL 500 MG/100ML IV SOLN
500.0000 mg | Freq: Once | INTRAVENOUS | Status: AC
  Administered 2022-02-09: 500 mg via INTRAVENOUS
  Filled 2022-02-09 (×2): qty 100

## 2022-02-09 MED ORDER — DEXTROSE 50 % IV SOLN
50.0000 mL | INTRAVENOUS | Status: DC | PRN
  Administered 2022-02-21: 50 mL via INTRAVENOUS
  Filled 2022-02-09: qty 50

## 2022-02-09 MED ORDER — LACTATED RINGERS IV BOLUS: 1000.0000 mL | Freq: Once | INTRAVENOUS | Status: DC

## 2022-02-09 MED ORDER — CLONAZEPAM 0.5 MG PO TABS: 0.5000 mg | ORAL_TABLET | Freq: Two times a day (BID) | ORAL | Status: DC | PRN

## 2022-02-09 MED ORDER — ASPIRIN 81 MG PO TBEC
81.0000 mg | DELAYED_RELEASE_TABLET | Freq: Every day | ORAL | Status: DC
  Administered 2022-02-10 – 2022-03-01 (×20): 81 mg via ORAL
  Filled 2022-02-09 (×20): qty 1

## 2022-02-09 MED ORDER — INSULIN ASPART 100 UNIT/ML IJ SOLN
0.0000 [IU] | Freq: Every day | INTRAMUSCULAR | Status: DC
  Administered 2022-02-12 – 2022-02-13 (×2): 5 [IU] via SUBCUTANEOUS
  Administered 2022-02-14: 4 [IU] via SUBCUTANEOUS
  Administered 2022-02-15: 0 [IU] via SUBCUTANEOUS
  Administered 2022-02-16 – 2022-02-17 (×2): 2 [IU] via SUBCUTANEOUS
  Administered 2022-02-19: 5 [IU] via SUBCUTANEOUS
  Administered 2022-02-21 – 2022-02-24 (×4): 2 [IU] via SUBCUTANEOUS
  Administered 2022-02-25: 4 [IU] via SUBCUTANEOUS
  Administered 2022-02-26: 3 [IU] via SUBCUTANEOUS
  Filled 2022-02-09 (×13): qty 1

## 2022-02-09 MED ORDER — NICOTINE 21 MG/24HR TD PT24
21.0000 mg | MEDICATED_PATCH | Freq: Every day | TRANSDERMAL | Status: DC
  Administered 2022-02-14 – 2022-03-01 (×9): 21 mg via TRANSDERMAL
  Filled 2022-02-09 (×20): qty 1

## 2022-02-09 MED ORDER — MELATONIN 5 MG PO TABS
2.5000 mg | ORAL_TABLET | Freq: Every day | ORAL | Status: DC
  Administered 2022-02-09 – 2022-02-28 (×20): 2.5 mg via ORAL
  Filled 2022-02-09 (×20): qty 1

## 2022-02-09 MED ORDER — QUETIAPINE FUMARATE ER 50 MG PO TB24
50.0000 mg | ORAL_TABLET | Freq: Every day | ORAL | Status: DC
  Administered 2022-02-09 – 2022-02-28 (×19): 50 mg via ORAL
  Filled 2022-02-09 (×21): qty 1

## 2022-02-09 MED ORDER — BUPROPION HCL ER (XL) 150 MG PO TB24
150.0000 mg | ORAL_TABLET | Freq: Every day | ORAL | Status: DC
  Administered 2022-02-10 – 2022-03-01 (×20): 150 mg via ORAL
  Filled 2022-02-09 (×20): qty 1

## 2022-02-09 MED ORDER — ADULT MULTIVITAMIN W/MINERALS CH
1.0000 | ORAL_TABLET | Freq: Every day | ORAL | Status: DC
  Administered 2022-02-10 – 2022-03-01 (×20): 1 via ORAL
  Filled 2022-02-09 (×22): qty 1

## 2022-02-09 MED ORDER — SODIUM CHLORIDE 0.9 % IV BOLUS
1000.0000 mL | Freq: Once | INTRAVENOUS | Status: AC
  Administered 2022-02-09: 1000 mL via INTRAVENOUS

## 2022-02-09 MED ORDER — MIRTAZAPINE 15 MG PO TABS
15.0000 mg | ORAL_TABLET | Freq: Every day | ORAL | Status: DC
  Administered 2022-02-09 – 2022-02-28 (×20): 15 mg via ORAL
  Filled 2022-02-09 (×20): qty 1

## 2022-02-09 MED ORDER — ROPINIROLE HCL 1 MG PO TABS
0.5000 mg | ORAL_TABLET | Freq: Every day | ORAL | Status: DC
  Administered 2022-02-09 – 2022-02-28 (×20): 0.5 mg via ORAL
  Filled 2022-02-09: qty 2
  Filled 2022-02-09 (×16): qty 1
  Filled 2022-02-09: qty 2
  Filled 2022-02-09 (×2): qty 1

## 2022-02-09 MED ORDER — SODIUM CHLORIDE 0.9 % IV SOLN: INTRAVENOUS | Status: DC

## 2022-02-09 MED ORDER — GLUCERNA SHAKE PO LIQD
237.0000 mL | Freq: Three times a day (TID) | ORAL | Status: DC
  Administered 2022-02-11 – 2022-02-18 (×17): 237 mL via ORAL

## 2022-02-09 MED ORDER — ACETAMINOPHEN 325 MG PO TABS
650.0000 mg | ORAL_TABLET | Freq: Four times a day (QID) | ORAL | Status: DC | PRN
  Administered 2022-02-11 – 2022-02-25 (×5): 650 mg via ORAL
  Filled 2022-02-09 (×5): qty 2

## 2022-02-09 MED ORDER — FAMOTIDINE 20 MG PO TABS
20.0000 mg | ORAL_TABLET | Freq: Every day | ORAL | Status: DC
  Administered 2022-02-10 – 2022-03-01 (×20): 20 mg via ORAL
  Filled 2022-02-09 (×20): qty 1

## 2022-02-09 MED ORDER — ATORVASTATIN CALCIUM 20 MG PO TABS
40.0000 mg | ORAL_TABLET | Freq: Every day | ORAL | Status: DC
  Administered 2022-02-09 – 2022-02-28 (×20): 40 mg via ORAL
  Filled 2022-02-09 (×20): qty 2

## 2022-02-09 MED ORDER — LORAZEPAM 2 MG/ML IJ SOLN: 1.0000 mg | INTRAMUSCULAR | Status: DC | PRN

## 2022-02-09 MED ORDER — SODIUM CHLORIDE 0.9 % IV BOLUS
500.0000 mL | Freq: Once | INTRAVENOUS | Status: AC
  Administered 2022-02-09: 500 mL via INTRAVENOUS

## 2022-02-09 MED ORDER — LORATADINE 10 MG PO TABS: 10.0000 mg | ORAL_TABLET | Freq: Every day | ORAL | Status: DC | PRN

## 2022-02-09 MED ORDER — ONDANSETRON HCL 4 MG/2ML IJ SOLN: 4.0000 mg | Freq: Three times a day (TID) | INTRAMUSCULAR | Status: DC | PRN

## 2022-02-09 MED ORDER — ENOXAPARIN SODIUM 40 MG/0.4ML IJ SOSY
40.0000 mg | PREFILLED_SYRINGE | INTRAMUSCULAR | Status: DC
  Administered 2022-02-09 – 2022-02-28 (×20): 40 mg via SUBCUTANEOUS
  Filled 2022-02-09 (×19): qty 0.4

## 2022-02-09 MED ORDER — PIPERACILLIN-TAZOBACTAM 3.375 G IVPB 30 MIN
3.3750 g | Freq: Once | INTRAVENOUS | Status: AC
  Administered 2022-02-09: 3.375 g via INTRAVENOUS
  Filled 2022-02-09: qty 50

## 2022-02-09 MED ORDER — DM-GUAIFENESIN ER 30-600 MG PO TB12: 1.0000 | ORAL_TABLET | Freq: Two times a day (BID) | ORAL | Status: DC | PRN

## 2022-02-09 MED ORDER — ALBUTEROL SULFATE (2.5 MG/3ML) 0.083% IN NEBU
2.5000 mg | INHALATION_SOLUTION | RESPIRATORY_TRACT | Status: DC | PRN
  Filled 2022-02-09: qty 3

## 2022-02-09 MED ORDER — VALPROATE SODIUM 100 MG/ML IV SOLN
500.0000 mg | Freq: Two times a day (BID) | INTRAVENOUS | Status: DC
  Administered 2022-02-09: 500 mg via INTRAVENOUS
  Filled 2022-02-09 (×3): qty 5

## 2022-02-09 MED ORDER — INSULIN ASPART 100 UNIT/ML IJ SOLN
0.0000 [IU] | Freq: Three times a day (TID) | INTRAMUSCULAR | Status: DC
  Administered 2022-02-10 – 2022-02-11 (×2): 7 [IU] via SUBCUTANEOUS
  Administered 2022-02-11: 2 [IU] via SUBCUTANEOUS
  Administered 2022-02-12: 7 [IU] via SUBCUTANEOUS
  Administered 2022-02-12: 3 [IU] via SUBCUTANEOUS
  Administered 2022-02-12 – 2022-02-13 (×3): 2 [IU] via SUBCUTANEOUS
  Administered 2022-02-13: 3 [IU] via SUBCUTANEOUS
  Administered 2022-02-14: 5 [IU] via SUBCUTANEOUS
  Administered 2022-02-14: 2 [IU] via SUBCUTANEOUS
  Administered 2022-02-15: 7 [IU] via SUBCUTANEOUS
  Administered 2022-02-15: 5 [IU] via SUBCUTANEOUS
  Administered 2022-02-15: 2 [IU] via SUBCUTANEOUS
  Administered 2022-02-16 – 2022-02-17 (×2): 5 [IU] via SUBCUTANEOUS
  Administered 2022-02-17: 2 [IU] via SUBCUTANEOUS
  Administered 2022-02-18: 7 [IU] via SUBCUTANEOUS
  Administered 2022-02-18: 2 [IU] via SUBCUTANEOUS
  Administered 2022-02-19: 7 [IU] via SUBCUTANEOUS
  Administered 2022-02-19: 3 [IU] via SUBCUTANEOUS
  Administered 2022-02-20: 5 [IU] via SUBCUTANEOUS
  Administered 2022-02-20: 7 [IU] via SUBCUTANEOUS
  Administered 2022-02-21 – 2022-02-22 (×4): 2 [IU] via SUBCUTANEOUS
  Administered 2022-02-22: 5 [IU] via SUBCUTANEOUS
  Administered 2022-02-23 (×2): 2 [IU] via SUBCUTANEOUS
  Administered 2022-02-23 – 2022-02-24 (×2): 3 [IU] via SUBCUTANEOUS
  Administered 2022-02-24: 2 [IU] via SUBCUTANEOUS
  Administered 2022-02-25: 3 [IU] via SUBCUTANEOUS
  Administered 2022-02-25: 5 [IU] via SUBCUTANEOUS
  Administered 2022-02-26: 3 [IU] via SUBCUTANEOUS
  Administered 2022-02-26: 7 [IU] via SUBCUTANEOUS
  Administered 2022-02-26: 5 [IU] via SUBCUTANEOUS
  Administered 2022-02-27 (×2): 9 [IU] via SUBCUTANEOUS
  Administered 2022-02-28 (×2): 1 [IU] via SUBCUTANEOUS
  Administered 2022-02-28: 5 [IU] via SUBCUTANEOUS
  Administered 2022-03-01: 2 [IU] via SUBCUTANEOUS
  Filled 2022-02-09 (×42): qty 1

## 2022-02-09 MED ORDER — ZINC SULFATE 220 (50 ZN) MG PO CAPS: 220.0000 mg | ORAL_CAPSULE | Freq: Every day | ORAL | Status: DC

## 2022-02-09 NOTE — ED Notes (Signed)
Placed patient on bedpan but reports she is having trouble urinating and getting it started.  Unable to obtain oral temp on patient at this time.  Patient still currently has bair Music therapist in place.

## 2022-02-09 NOTE — ED Notes (Signed)
Finishing 500cc of D10 per Dr Jacqualine Code.

## 2022-02-09 NOTE — Sepsis Progress Note (Signed)
eLink is following this Code Sepsis. °

## 2022-02-09 NOTE — ED Provider Notes (Signed)
[facility] Provider Note    Event Date/Time   First MD Initiated Contact with Patient 02/09/22 1318     (approximate)   History   Hypoglycemia and Seizures  EM caveat: Confusion, altered mental status  HPI  Brittany [name] is a 61 y.o. female with a history of recent admission for hyperkalemia, diabetes, seizures anxiety stroke CHF   Patient presents after having several witnessed seizures at her care facility.    Patient does not recall the events  Physical Exam   Triage Vital Signs: ED Triage Vitals  Enc Vitals Group     BP      Pulse      Resp      Temp      Temp src      SpO2      Weight      Height      Head Circumference      Peak Flow      Pain Score      Pain Loc      Pain Edu?      Excl. in [place]?     Most recent vital signs: Vitals:   02/09/22 1344 02/09/22 1423  BP: 90/76 108/77  Pulse: 76 63  Resp: 18 18  Temp:    SpO2: 93% 96%     General: Awake, no distress.  She is conversant, alert following commands.  Reports thirsty and is given 1 cup of orange juice which she drank without difficulty.  Mucous membranes appear slightly dry, she appears frail and somewhat fatigued but in no acute distress or extremis CV:  Good peripheral perfusion.  Normal tones and rate Resp:  Normal effort.  Clear bilaterally.  Speaks in full clear sentences Abd:  No distention.  Soft nontender throughout.  Patient reports urge to urinate Other:  Moves extremities to command without deficits.  She is disoriented to year and to situation as to how she came to the hospital.  She believes she still lives at home with her parents   ED Results / Procedures / Treatments   Labs (all labs ordered are listed, but only abnormal results are displayed) Labs Reviewed  BASIC METABOLIC PANEL - Abnormal; Notable for the following components:      Result Value   Chloride 115 (*)    CO2 21 (*)    Glucose, Bld 164 (*)    Calcium 8.6 (*)    All other  components within normal limits  CBC - Abnormal; Notable for the following components:   RBC 3.59 (*)    Hemoglobin 10.0 (*)    HCT 31.8 (*)    RDW 20.8 (*)    nRBC 0.3 (*)    All other components within normal limits  LACTIC ACID, PLASMA - Abnormal; Notable for the following components:   Lactic Acid, Venous 2.3 (*)    All other components within normal limits  CBG MONITORING, ED - Abnormal; Notable for the following components:   Glucose-Capillary 141 (*)    All other components within normal limits  CBG MONITORING, ED - Abnormal; Notable for the following components:   Glucose-Capillary 317 (*)    All other components within normal limits  RESP PANEL BY RT-PCR (FLU A&B, COVID) ARPGX2  CULTURE, BLOOD (ROUTINE X 2)  CULTURE, BLOOD (ROUTINE X 2)  URINE CULTURE  PROTIME-INR  APTT  LACTIC ACID, PLASMA  URINALYSIS, COMPLETE (UACMP) WITH MICROSCOPIC  CBG MONITORING, ED  CBG MONITORING, ED  CBG  MONITORING, ED  CBG MONITORING, ED  CBG MONITORING, ED  CBG MONITORING, ED  CBG MONITORING, ED  CBG MONITORING, ED  CBG MONITORING, ED  CBG MONITORING, ED  CBG MONITORING, ED  CBG MONITORING, ED  CBG MONITORING, ED  CBG MONITORING, ED     EKG  Interpreted by me at 1410 heart rate 65 QRS 80 QTc 440 Normal sinus rhythm no evidence of acute ischemia   RADIOLOGY  DG Chest Port 1 View  Result Date: 02/09/2022 CLINICAL DATA:  Sepsis. EXAM: PORTABLE CHEST 1 VIEW COMPARISON:  December 09, 2021. FINDINGS: Stable cardiomediastinal silhouette. Stable right basilar atelectasis or scarring is noted. Left lung is unremarkable. Bony thorax is unremarkable. IMPRESSION: Stable right basilar atelectasis or scarring is noted. Electronically Signed   By: Marijo Conception M.D.   On: 02/09/2022 13:42    Chest x-ray interpreted by me as negative for acute infiltrate or pneumothorax   PROCEDURES:  Critical Care performed: Yes, see critical care procedure note(s)  CRITICAL CARE Performed by:  Delman Kitten   Total critical care time: 30 minutes  Critical care time was exclusive of separately billable procedures and treating other patients.  Critical care was necessary to treat or prevent imminent or life-threatening deterioration.  Critical care was time spent personally by me on the following activities: development of treatment plan with patient and/or surrogate as well as nursing, discussions with consultants, evaluation of patient's response to treatment, examination of patient, obtaining history from patient or surrogate, ordering and performing treatments and interventions, ordering and review of laboratory studies, ordering and review of radiographic studies, pulse oximetry and re-evaluation of patient's condition.   Procedures   MEDICATIONS ORDERED IN ED: Medications  levETIRAcetam (KEPPRA) IVPB 500 mg/100 mL premix (has no administration in time range)  LORazepam (ATIVAN) injection 1 mg (has no administration in time range)  nicotine (NICODERM CQ - dosed in mg/24 hours) patch 21 mg (has no administration in time range)  sodium chloride 0.9 % bolus 1,000 mL (1,000 mLs Intravenous New Bag/Given 02/09/22 1343)  piperacillin-tazobactam (ZOSYN) IVPB 3.375 g (3.375 g Intravenous New Bag/Given 02/09/22 1410)     IMPRESSION / MDM / ASSESSMENT AND PLAN / ED COURSE  I reviewed the triage vital signs and the nursing notes.                              Differential diagnosis includes, but is not limited to, sepsis, hypoglycemia secondary to diabetes treatments, metabolic abnormality, hemorrhage, hypovolemia dehydration shock etc.  Differential diagnosis quite broad but based on the patient's hypothermia hypoglycemia hypotension high concern for possible infectious cause will initiate broad-spectrum antibiotic coverage as we continue to workup.  Thankfully her blood sugars improved she is able to take by mouth and her blood sugar has responded appropriately with a blood sugar down  to 300, continuing to monitor this every hour.  Vitals:   02/09/22 1344 02/09/22 1423  BP: 90/76 108/77  Pulse: 76 63  Resp: 18 18  Temp:    SpO2: 93% 96%    There are no obvious sources had identified I have ordered empiric antibiotic therapy for a high concern of potential severe sepsis.  The patient is hypothermic, her blood pressure has dipped as low as a MAP of 70.  She is alert and oriented to self but not to place which appears to be near baseline.  I have ordered a dose of Keppra due to her  seizures but also the fact that she was hypoglycemic I suspect is the driving factor for the seizure but this may also be being driven by another metabolic process infection etc.  Broad differential remains but high concern for infection.  Initiating antibiotic therapy as we work her up obtain cultures etc.  Rewarming with the bear hugger warm blankets, etc.  Warm fluids.  Patient's presentation is most consistent with acute presentation with potential threat to life or bodily function.   The patient is on the cardiac monitor to evaluate for evidence of arrhythmia and/or significant heart rate changes.   Clinical Course as of 02/09/22 1514  Fri Feb 09, 2022  1437 After juice.  Patient is alert, in no distress resting comfortably in the hallway blood pressure improving with fluid resuscitation. [MQ]    Clinical Course User Index [MQ] Delman Kitten, MD   ----------------------------------------- 3:14 PM on 02/09/2022 ----------------------------------------- Consulted with the hospitalist patient will be admitted to the service of Dr. Lynda Rainwater notable for normal white count mild anemia.  Minimally elevated lactic acid at 2.3.  Negative COVID and influenza testing  FINAL CLINICAL IMPRESSION(S) / ED DIAGNOSES   Final diagnoses:  Seizure ([place])  Hypoglycemia  SIRS (systemic inflammatory response syndrome) ([place])     Rx / DC Orders   ED Discharge Orders     None        Note:   This document was prepared using Dragon voice recognition software and may include unintentional dictation errors.   Delman Kitten, MD 02/09/22 1515

## 2022-02-09 NOTE — Sepsis Progress Note (Addendum)
[place] Notified provider of need to order another repeat lactic acid, as the second came back higher than the first.  1830 Notified bedside nurse of need to draw repeat lactic acid.

## 2022-02-09 NOTE — Consult Note (Signed)
PHARMACY -  BRIEF ANTIBIOTIC NOTE   Pharmacy has received consult(s) for Zosyn dosing from an ED provider.  The patient's profile has been reviewed for ht/wt/allergies/indication/available labs.    One time order(s) placed for Zosyn 3.375 grams  Further antibiotics/pharmacy consults should be ordered by admitting physician if indicated.                       Thank you, Lorin Picket 02/09/2022  1:37 PM

## 2022-02-09 NOTE — ED Notes (Signed)
Patient provided with 3 8oz cups of OJ

## 2022-02-09 NOTE — Progress Notes (Signed)
CODE SEPSIS - PHARMACY COMMUNICATION  **Broad Spectrum Antibiotics should be administered within 1 hour of Sepsis diagnosis**  Time Code Sepsis Called/Page Received: 1336  Antibiotics Ordered: Zosyn  Time of 1st antibiotic administration: 1410  Additional action taken by pharmacy: N/A   Lorin Picket ,PharmD Clinical Pharmacist  02/09/2022  1:37 PM

## 2022-02-09 NOTE — ED Notes (Signed)
Lab called for 2nd set of cultures but going to hang antibiotics to not delay start.

## 2022-02-09 NOTE — H&P (Signed)
History and Physical    Brittany [name]:[id number] DOB: Nov 21, 1960 DOA: 02/09/2022  Referring MD/NP/PA:   PCP: [name], NP   Patient coming from:  The patient is coming from SNF   Chief Complaint: seizure  HPI: Brittany [name] is a 61 y.o. 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 difficile, who presents with seizure.  Per report, pt had several episodes of witnessed seizures at her care facility. Patient does not remember what happened to her. Per report, pt was initially in postictal state.  Her mental status has improved to normal when I saw patient in ED.  She is oriented x 3. She denies unilateral numbness or tingling in extremities.  No facial droop or slurred speech.  She has mild headache.  Patient denies chest pain, cough, shortness breath.  No nausea, vomiting, diarrhea or abdominal pain.  No symptoms of UTI.  No increased output in colostomy bag.  Per report, patient was found to have hypoglycemia with CBG in the 40s and then dropped to the 30s. Pt was started on D10 drip by EMS. Her CBG on arrival to ED was 141, then increased to 317.   Data reviewed independently and ED Course: pt was found to have WBC 6.4, lactic acid 2.3, 4.2, procalcitonin<0.10, INR 1.2, PTT 31, negative COVID and flu PCR, GFR> 60, negative urinalysis. Hypothermia with temperature 94.8, initially hypotensive with blood pressure 85/59, which improved to 108/77 after giving 1 L normal saline in ED.  Heart rate 63, RR 18, oxygen saturation 96% on room air.  Chest x-ray negative for infiltration.  CT head is negative for acute intracranial abnormalities. Patient is placed in PCU for observation.   EKG: I have personally reviewed.  Sinus rhythm, QTc 438, low voltage, no ischemic change   Review of Systems:    General: no fevers, chills, no body weight gain, has fatigue HEENT: no blurry vision, hearing changes or sore throat Respiratory: no dyspnea, coughing, wheezing CV: no chest pain, no palpitations GI: no nausea, vomiting, abdominal pain, diarrhea, constipation GU: no dysuria, burning on urination, increased urinary frequency, hematuria  Ext: no leg edema Neuro: no unilateral weakness, numbness, or tingling, no vision change or hearing loss. Had seizure Skin: no rash, no skin tear. MSK: No muscle spasm, no deformity, no limitation of range of movement in spin Heme: No easy bruising.  Travel history: No recent long distant travel.   Allergy:  Allergies  Allergen Reactions   Cephalosporins Itching    TOLERATED ZOSYN (PIPERACILLIN) BEFORE   Hydromorphone Itching   Keflin [Cephalothin] Itching   Lactose Intolerance (Gi) Diarrhea    Past Medical History:  Diagnosis Date   C. difficile colitis 02/17/2021   Colostomy in place [facility])    h/o diverticulitis with abscess   Complicated grief 09/22/9145   COPD, mild ([place]) 09/16/2015   PFTs on 06/15/15 with FEV1/FVC ratio of 64%, FEV1 83%, DLCO 47%   Diabetes mellitus without complication (HCC)    Dyslipidemia    History of Clostridium difficile colitis 03/03/2021   Hyperosmolar hyperglycemic state (HHS) ([place]) 11/18/2020   Hypertension    Hypokalemia; hyperkalemia 07/12/2019   Lactic acidosis 11/19/2020   Lupus (HCC)    Pancreatic cancer (HCC)    RLS (restless legs syndrome)    Seizure disorder ([place])    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 She has been exposed to tobacco smoke. She has never used smokeless tobacco. She reports that she does not currently use alcohol. She reports that she does not currently use drugs after having used the following drugs: Marijuana.  Family History:  Family History  Problem Relation Age of Onset   Anxiety disorder Sister    Breast cancer Maternal Aunt      Prior to Admission medications   Medication Sig Start Date End Date Taking? Authorizing Provider  aspirin 81 MG EC tablet Take 1 tablet (81 mg total) by mouth daily. Swallow whole. 05/23/21  Yes Pokhrel, Laxman, MD  atorvastatin (LIPITOR) 40 MG tablet Take 1 tablet (40 mg total) by mouth Nightly. Patient taking differently: Take 40 mg by mouth at bedtime. 03/14/21 02/09/22 Yes Fritzi Mandes, MD  buPROPion (WELLBUTRIN XL) 150 MG 24 hr tablet Take 150 mg by mouth daily.   Yes [provider]  clonazePAM (KLONOPIN) 0.5 MG tablet Take 1 tablet (0.5 mg total) by mouth 2 (two) times daily as needed for anxiety. 12/30/21  Yes Annita Brod, MD  Dextrose, Diabetic Use, ([facility] GLUCOSE BITS) 1 g CHEW Chew 1 tablet by mouth daily as needed (hypoglycemia). 12/11/21  Yes [provider]  diltiazem (CARDIZEM CD) 120 MG 24 hr capsule Take 1 capsule (120 mg total) by mouth daily. 10/17/21  Yes Shelly Coss, MD  divalproex (DEPAKOTE) 500 MG DR tablet Take 1 tablet (500 mg total) by mouth 2 (two) times daily. 11/09/21  Yes Janine Ores, NP  famotidine (PEPCID) 20 MG tablet Take 1 tablet (20 mg total) by mouth daily. 08/25/21  Yes Fritzi Mandes, MD  insulin aspart (NOVOLOG) 100 UNIT/ML injection Inject 5 Units into the skin 3 (three) times daily with meals. If eats more than 5)% of meals Patient taking differently: Inject 3 Units into the skin 3 (three) times daily with meals. Hold for blood sugar less than 120 10/16/21  Yes Adhikari, Amrit, MD  LANTUS SOLOSTAR 100 UNIT/ML Solostar Pen Inject 18 Units into the skin daily. 02/07/22  Yes  [provider]  loratadine (CLARITIN) 10 MG tablet Take 10 mg by mouth daily as needed for allergies.   Yes [provider]  melatonin 5 MG TABS Take 0.5 tablets (2.5 mg total) by mouth at bedtime. 10/16/21  Yes Shelly Coss, MD  metFORMIN (GLUCOPHAGE) 1000 MG tablet Take 1,000 mg by mouth 2 (two) times daily. 12/04/21  Yes [provider]  mirtazapine (REMERON) 15 MG tablet Take 15 mg by mouth at bedtime.   Yes [provider]  Multiple Vitamin (MULTIVITAMIN WITH MINERALS) TABS tablet Take 1 tablet by mouth daily. 08/25/21  Yes Fritzi Mandes, MD  nicotine (NICODERM CQ - DOSED IN MG/24 HOURS) 21 mg/24hr patch Place 1 patch (21 mg total) onto the skin daily as needed (nicotine craving). 12/30/21  Yes Annita Brod, MD  QUEtiapine (SEROQUEL XR) 50 MG TB24 24 hr tablet Take 50 mg by mouth at bedtime. 12/04/21  Yes [provider]  rOPINIRole (REQUIP) 0.5 MG tablet Take 0.5 mg by mouth at bedtime. 06/29/21  Yes  [provider]  SPS 15 GM/60ML suspension Take 15 g by mouth every other day. 12/25/21  Yes [provider]  zinc sulfate 220 (50 Zn) MG capsule Take 1 capsule (220 mg total) by mouth daily. 10/17/21  Yes Shelly Coss, MD  buPROPion (WELLBUTRIN) 75 MG tablet Take 75 mg by mouth 2 (two) times daily. Patient not taking: Reported on 02/09/2022 12/04/21   [provider]  feeding supplement, GLUCERNA SHAKE, (GLUCERNA SHAKE) LIQD Take 237 mLs by mouth 3 (three) times daily between meals. 08/24/21   Fritzi Mandes, MD  insulin detemir (LEVEMIR) 100 UNIT/ML injection Inject 0.08 mLs (8 Units total) into the skin 2 (two) times daily. Patient taking differently: Inject 10 Units into the skin 2 (two) times daily. 10/23/21   Lorella Nimrod, MD    Physical Exam: Vitals:   02/09/22 1344 02/09/22 1345 02/09/22 1423 02/09/22 1630  BP: 90/76  108/77 91/72  Pulse: 76  63 63  Resp: '18  18 16  '$ Temp:      TempSrc:      SpO2: 93%  96% 95%   Weight:  54.9 kg    Height:  5' (1.524 m)     General: Not in acute distress HEENT:       Eyes: PERRL, EOMI, no scleral icterus.       ENT: No discharge from the ears and nose, no pharynx injection, no tonsillar enlargement.        Neck: No JVD, no bruit, no mass felt. Heme: No neck lymph node enlargement. Cardiac: S1/S2, RRR, No murmurs, No gallops or rubs. Respiratory: No rales, wheezing, rhonchi or rubs. GI: Soft, nondistended, nontender, no rebound pain, no organomegaly, BS present.  Colostomy in place GU: No hematuria Ext: No pitting leg edema bilaterally. 1+DP/PT pulse bilaterally. Musculoskeletal: No joint deformities, No joint redness or warmth, no limitation of ROM in spin. Skin: No rashes.  Neuro: Currently alert, oriented X3, cranial nerves II-XII grossly intact, moves all extremities normally.  Psych: Patient is not psychotic, no suicidal or hemocidal ideation.  Labs on Admission: I have personally reviewed following labs and imaging studies  CBC: Recent Labs  Lab 02/09/22 1331  WBC 6.4  HGB 10.0*  HCT 31.8*  MCV 88.6  PLT 373   Basic Metabolic Panel: Recent Labs  Lab 02/09/22 1331  NA 141  K 4.5  CL 115*  CO2 21*  GLUCOSE 164*  BUN 14  CREATININE 0.65  CALCIUM 8.6*   GFR: Estimated Creatinine Clearance: 56.7 mL/min (by C-G formula based on SCr of 0.65 mg/dL). Liver Function Tests: No results for input(s): "AST", "ALT", "ALKPHOS", "BILITOT", "PROT", "ALBUMIN" in the last 168 hours. No results for input(s): "LIPASE", "AMYLASE" in the last 168 hours. No results for input(s): "AMMONIA" in the last 168 hours. Coagulation Profile: Recent Labs  Lab 02/09/22 1331  INR 1.2   Cardiac Enzymes: No results for input(s): "CKTOTAL", "CKMB", "CKMBINDEX", "TROPONINI" in the last 168 hours. BNP (last 3 results) No results for input(s): "PROBNP" in the last 8760 hours. HbA1C: No results for input(s): "HGBA1C" in the last 72 hours. CBG: Recent Labs  Lab  02/09/22 1309 02/09/22 1420 02/09/22 1648  GLUCAP 141* 317* 165*   Lipid Profile: No results for input(s): "CHOL", "HDL", "LDLCALC", "TRIG", "CHOLHDL", "LDLDIRECT" in the last 72 hours. Thyroid Function Tests: No results for input(s): "TSH", "T4TOTAL", "FREET4", "T3FREE", "THYROIDAB" in the last 72 hours. Anemia Panel: No results for input(s): "VITAMINB12", "FOLATE", "FERRITIN", "TIBC", "IRON", "RETICCTPCT" in the last  72 hours. Urine analysis:    Component Value Date/Time   COLORURINE YELLOW (A) 02/09/2022 1327   APPEARANCEUR HAZY (A) 02/09/2022 1327   APPEARANCEUR Hazy 04/04/2011 1935   LABSPEC 1.013 02/09/2022 1327   LABSPEC >1.060 04/04/2011 1935   PHURINE 5.0 02/09/2022 1327   GLUCOSEU 50 (A) 02/09/2022 1327   GLUCOSEU 50 mg/dL 04/04/2011 1935   HGBUR NEGATIVE 02/09/2022 1327   BILIRUBINUR NEGATIVE 02/09/2022 1327   BILIRUBINUR Negative 04/04/2011 1935   KETONESUR NEGATIVE 02/09/2022 1327   PROTEINUR NEGATIVE 02/09/2022 1327   NITRITE NEGATIVE 02/09/2022 1327   LEUKOCYTESUR NEGATIVE 02/09/2022 1327   LEUKOCYTESUR Negative 04/04/2011 1935   Sepsis Labs: '@LABRCNTIP'$ (procalcitonin:4,lacticidven:4) ) Recent Results (from the past 240 hour(s))  Resp Panel by RT-PCR (Flu A&B, Covid) Anterior Nasal Swab     Status: None   Collection Time: 02/09/22  1:31 PM   Specimen: Anterior Nasal Swab  Result Value Ref Range Status   SARS Coronavirus 2 by RT PCR NEGATIVE NEGATIVE Final    Comment: (NOTE) SARS-CoV-2 target nucleic acids are NOT DETECTED.  The SARS-CoV-2 RNA is generally detectable in upper respiratory specimens during the acute phase of infection. The lowest concentration of SARS-CoV-2 viral copies this assay can detect is 138 copies/mL. A negative result does not preclude SARS-Cov-2 infection and should not be used as the sole basis for treatment or other patient management decisions. A negative result may occur with  improper specimen collection/handling, submission  of specimen other than nasopharyngeal swab, presence of viral mutation(s) within the areas targeted by this assay, and inadequate number of viral copies(<138 copies/mL). A negative result must be combined with clinical observations, patient history, and epidemiological information. The expected result is Negative.  Fact Sheet for Patients:  EntrepreneurPulse.com.au  Fact Sheet for Healthcare Providers:  IncredibleEmployment.be  This test is no t yet approved or cleared by the [place] FDA and  has been authorized for detection and/or diagnosis of SARS-CoV-2 by FDA under an Emergency Use Authorization (EUA). This EUA will remain  in effect (meaning this test can be used) for the duration of the COVID-19 declaration under Section 564(b)(1) of the Act, 21 U.S.C.section 360bbb-3(b)(1), unless the authorization is terminated  or revoked sooner.       Influenza A by PCR NEGATIVE NEGATIVE Final   Influenza B by PCR NEGATIVE NEGATIVE Final    Comment: (NOTE) The Xpert Xpress SARS-CoV-2/FLU/RSV plus assay is intended as an aid in the diagnosis of influenza from Nasopharyngeal swab specimens and should not be used as a sole basis for treatment. Nasal washings and aspirates are unacceptable for Xpert Xpress SARS-CoV-2/FLU/RSV testing.  Fact Sheet for Patients: EntrepreneurPulse.com.au  Fact Sheet for Healthcare Providers: IncredibleEmployment.be  This test is not yet approved or cleared by the [place] FDA and has been authorized for detection and/or diagnosis of SARS-CoV-2 by FDA under an Emergency Use Authorization (EUA). This EUA will remain in effect (meaning this test can be used) for the duration of the COVID-19 declaration under Section 564(b)(1) of the Act, 21 U.S.C. section 360bbb-3(b)(1), unless the authorization is terminated or revoked.  Performed at [facility], [address], [place], [place] [address]      Radiological Exams on Admission: CT HEAD WO CONTRAST (5MM)  Result Date: 02/09/2022 CLINICAL DATA:  Seizure disorder.  Clinical change. EXAM: CT HEAD WITHOUT CONTRAST TECHNIQUE: Contiguous axial images were obtained from the base of the skull through the vertex without intravenous contrast. RADIATION DOSE REDUCTION: This exam was performed  according to the departmental dose-optimization program which includes automated exposure control, adjustment of the mA and/or kV according to patient size and/or use of iterative reconstruction technique. COMPARISON:  CT brain 12/10/2021 FINDINGS: Brain: There is mild-to-moderate cortical atrophy, unchanged from prior and within normal limits for patient age. The ventricles are normal in configuration. The basilar cisterns are patent. There is again low-density and volume loss from a left posterior MCA territory infarct, unchanged. Left cerebellar infarct is also unchanged. No mass, mass effect, or midline shift. No acute intracranial hemorrhage is seen. No abnormal extra-axial fluid collection. Moderate periventricular and subcortical white matter patchy hypodensities are not significant changed, likely chronic ischemic white matter changes. Otherwise, there is preservation of the normal cortical gray-white interface without CT evidence of an acute major vascular territorial cortical based infarction. Vascular: No hyperdense vessel or unexpected calcification. Skull: Normal. Negative for fracture or focal lesion. Sinuses/Orbits: The visualized orbits are unremarkable. The visualized paranasal sinuses and mastoid air cells are clear. Other: None. IMPRESSION: 1. No acute intracranial process. 2. Stable posterior left MCA territory and left cerebellar remote infarcts. 3. Stable moderate chronic ischemic white matter changes. 4. Stable mild-to-moderate cortical atrophy, within normal limits for patient age. Electronically Signed   By: Yvonne Kendall M.D.   On: 02/09/2022 17:59   DG Chest Port 1 View  Result Date: 02/09/2022 CLINICAL DATA:  Sepsis. EXAM: PORTABLE CHEST 1 VIEW COMPARISON:  December 09, 2021. FINDINGS: Stable cardiomediastinal silhouette. Stable right basilar atelectasis or scarring is noted. Left lung is unremarkable. Bony thorax is unremarkable. IMPRESSION: Stable right basilar atelectasis or scarring is noted. Electronically Signed   By: Marijo Conception M.D.   On: 02/09/2022 13:42      Assessment/Plan Principal Problem:   Seizure ([place]) Active Problems:   Hypoglycemia   Hypothermia   Hypotension   Chronic diastolic CHF (congestive heart failure) (HCC)   Diabetes mellitus without complication (HCC)   History of stroke   HLD (hyperlipidemia)   Depression with anxiety   COPD (chronic obstructive pulmonary disease) (HCC)   Tobacco dependence   Elevated lactic acid level   HTN (hypertension)   Assessment and Plan:  Seizure [facility]): Patient is taking Depakote 500 mg twice a day.  She had several episodes of seizure per report.  It is likely induced by hypoglycemia.  -Placed on PCU for observation -Seizure precaution -As needed Ativan for seizure -Patient received 500 mg of Keppra in the ED -Will give Depakote 500 mg by IV -Check Depakote level  Hypoglycemia: Blood sugar responded to treatment quickly, improved to 300s -Hold Lantus -Check CBG every hour -D50 as needed  Hypothermia: Likely due to hypoglycemia -Bair hugger  Hypotension: May be due to dehydration. Initial blood pressure 85/59, which improved to 108/77 after giving IV fluid.  Patient does not have signs of infection.  Urinalysis negative.  Chest x-ray negative for infiltration.  Procalcitonin<0.10.  Lactic acid is elevated 2.3, 4.2, which is likely due to dehydration and seizure. -Hold off antibiotics -Follow-up blood culture and urine culture -IV fluid: 2.5 L normal saline, then 75 cc/h -Hold blood pressure medications  Chronic  diastolic CHF (congestive heart failure) ([place]): 2D echo on 10/29/2021 showed EF of 55 to 60% with grade 1 diastolic dysfunction.  Patient does not have leg edema JVD.  BNP normal at 40.  CHF is compensated. -Watch volume status closely  Diabetes mellitus without complication [facility]): Recent A1c 13.2, poorly controlled.  Patient is taking metformin, Novolin and Lantus 18  units daily -Hold Lantus due to hypoglycemia -Sliding scale insulin  History of stroke -Aspirin, Lipitor  HLD (hyperlipidemia) -Lipitor  Depression with anxiety -Continue home medications  COPD (chronic obstructive pulmonary disease) ([place]): Stable -Bronchodilators  Tobacco dependence -Nicotine patch  Elevated lactic acid level: Likely due to hypotension end-stage.  No signs of infection. -Trend lactic acid level -Continue IV fluid as above  HTN (hypertension) -Hold Cardizem due to hypotension    DVT ppx: SQ Lovenox  Code Status: Full code  Family Communication:   Yes, patient's  daughter   by phone  Disposition Plan:  Anticipate discharge back to previous environment, SNF  Consults called:  none  Admission status and Level of care: Progressive:   for obs   Dispo: The patient is from: SNF              Anticipated d/c is to: SNF              Anticipated d/c date is: 1 day              Patient currently is not medically stable to d/c.    Severity of Illness:  The appropriate patient status for this patient is OBSERVATION. Observation status is judged to be reasonable and necessary in order to provide the required intensity of service to ensure the patient's safety. The patient's presenting symptoms, physical exam findings, and initial radiographic and laboratory data in the context of their medical condition is felt to place them at decreased risk for further clinical deterioration. Furthermore, it is anticipated that the patient will be medically stable for discharge from the hospital within 2 midnights  of admission.        Date of Service 02/09/2022    [facility]   If 7PM-7AM, please contact night-coverage [email or web address] 02/09/2022, 6:12 PM

## 2022-02-09 NOTE — ED Triage Notes (Signed)
Pt to ED via [facility] from [facility] for seizures and hypoglycemia. Per EMS pt was seizing on their arrival but then stopped, pt in post ictal state. EMS reports CBG initially in the 40s and then dropped to the 30s. Pt was started on D 10 drip.Pt CBG on arrival to ED 141, pt is alert and able to follow commands.

## 2022-02-10 DIAGNOSIS — R569 Unspecified convulsions: Secondary | ICD-10-CM | POA: Diagnosis not present

## 2022-02-10 LAB — BASIC METABOLIC PANEL
Anion gap: 3 — ABNORMAL LOW (ref 5–15)
BUN: 10 mg/dL (ref 8–23)
CO2: 21 mmol/L — ABNORMAL LOW (ref 22–32)
Calcium: 8.3 mg/dL — ABNORMAL LOW (ref 8.9–10.3)
Chloride: 115 mmol/L — ABNORMAL HIGH (ref 98–111)
Creatinine, Ser: 0.87 mg/dL (ref 0.44–1.00)
GFR, Estimated: >60 mL/min (ref >60)
Glucose, Bld: 265 mg/dL — ABNORMAL HIGH (ref 70–99)
Potassium: 6.4 mmol/L (ref 3.5–5.1)
Sodium: 139 mmol/L (ref 135–145)

## 2022-02-10 LAB — URINE CULTURE: Culture: NO GROWTH

## 2022-02-10 LAB — LACTIC ACID, PLASMA: Lactic Acid, Venous: 2.1 mmol/L (ref 0.5–1.9)

## 2022-02-10 LAB — CBC
HCT: 33 % — ABNORMAL LOW (ref 36.0–46.0)
Hemoglobin: 10.5 g/dL — ABNORMAL LOW (ref 12.0–15.0)
MCH: 27.9 pg (ref 26.0–34.0)
MCHC: 31.8 g/dL (ref 30.0–36.0)
MCV: 87.8 fL (ref 80.0–100.0)
Platelets: 217 10*3/uL (ref 150–400)
RBC: 3.76 MIL/uL — ABNORMAL LOW (ref 3.87–5.11)
RDW: 20.7 % — ABNORMAL HIGH (ref 11.5–15.5)
WBC: 5.6 10*3/uL (ref 4.0–10.5)
nRBC: 0 % (ref 0.0–0.2)

## 2022-02-10 LAB — POTASSIUM: Potassium: 5.4 mmol/L — ABNORMAL HIGH (ref 3.5–5.1)

## 2022-02-10 LAB — CBG MONITORING, ED
Glucose-Capillary: 198 mg/dL — ABNORMAL HIGH (ref 70–99)
Glucose-Capillary: 332 mg/dL — ABNORMAL HIGH (ref 70–99)
Glucose-Capillary: 299 mg/dL — ABNORMAL HIGH (ref 70–99)
Glucose-Capillary: 463 mg/dL — ABNORMAL HIGH (ref 70–99)

## 2022-02-10 MED ORDER — INSULIN GLARGINE-YFGN 100 UNIT/ML ~~LOC~~ SOLN
18.0000 [IU] | Freq: Every day | SUBCUTANEOUS | Status: DC
  Filled 2022-02-10: qty 0.18

## 2022-02-10 MED ORDER — ALBUTEROL SULFATE (2.5 MG/3ML) 0.083% IN NEBU
10.0000 mg | INHALATION_SOLUTION | RESPIRATORY_TRACT | Status: AC
  Administered 2022-02-10: 10 mg via RESPIRATORY_TRACT
  Filled 2022-02-10: qty 12

## 2022-02-10 MED ORDER — INSULIN GLARGINE-YFGN 100 UNIT/ML ~~LOC~~ SOLN
10.0000 [IU] | Freq: Two times a day (BID) | SUBCUTANEOUS | Status: DC
  Administered 2022-02-10 – 2022-02-14 (×9): 10 [IU] via SUBCUTANEOUS
  Filled 2022-02-10 (×11): qty 0.1

## 2022-02-10 MED ORDER — DEXTROSE 50 % IV SOLN
1.0000 | Freq: Once | INTRAVENOUS | Status: DC
  Filled 2022-02-10: qty 50

## 2022-02-10 MED ORDER — FUROSEMIDE 10 MG/ML IJ SOLN
40.0000 mg | Freq: Once | INTRAMUSCULAR | Status: DC
  Filled 2022-02-10: qty 4

## 2022-02-10 MED ORDER — INSULIN ASPART 100 UNIT/ML IJ SOLN
15.0000 [IU] | Freq: Once | INTRAMUSCULAR | Status: AC
  Administered 2022-02-11: 15 [IU] via SUBCUTANEOUS
  Filled 2022-02-10: qty 1

## 2022-02-10 MED ORDER — LACTULOSE 10 GM/15ML PO SOLN
20.0000 g | ORAL | Status: AC
  Administered 2022-02-10: 20 g via ORAL
  Filled 2022-02-10: qty 30

## 2022-02-10 MED ORDER — DILTIAZEM HCL ER COATED BEADS 120 MG PO CP24
120.0000 mg | ORAL_CAPSULE | Freq: Every day | ORAL | Status: DC
  Administered 2022-02-10 – 2022-02-15 (×5): 120 mg via ORAL
  Filled 2022-02-10 (×8): qty 1

## 2022-02-10 MED ORDER — LORAZEPAM 2 MG/ML IJ SOLN: 2.0000 mg | Freq: Four times a day (QID) | INTRAMUSCULAR | Status: DC | PRN

## 2022-02-10 MED ORDER — DIVALPROEX SODIUM 500 MG PO DR TAB
500.0000 mg | DELAYED_RELEASE_TABLET | Freq: Two times a day (BID) | ORAL | Status: DC
  Administered 2022-02-10 – 2022-03-01 (×39): 500 mg via ORAL
  Filled 2022-02-10 (×40): qty 1

## 2022-02-10 MED ORDER — CALCIUM GLUCONATE-NACL 1-0.675 GM/50ML-% IV SOLN
1.0000 g | Freq: Once | INTRAVENOUS | Status: DC
  Filled 2022-02-10: qty 50

## 2022-02-10 MED ORDER — SODIUM ZIRCONIUM CYCLOSILICATE 10 G PO PACK
10.0000 g | PACK | Freq: Once | ORAL | Status: DC
  Filled 2022-02-10: qty 1

## 2022-02-10 MED ORDER — SODIUM ZIRCONIUM CYCLOSILICATE 10 G PO PACK
10.0000 g | PACK | Freq: Once | ORAL | Status: AC
  Administered 2022-02-10: 10 g via ORAL
  Filled 2022-02-10: qty 1

## 2022-02-10 MED ORDER — INSULIN ASPART 100 UNIT/ML IV SOLN
10.0000 [IU] | Freq: Once | INTRAVENOUS | Status: DC
  Filled 2022-02-10: qty 0.1

## 2022-02-10 MED ORDER — SODIUM ZIRCONIUM CYCLOSILICATE 10 G PO PACK
10.0000 g | PACK | ORAL | Status: AC
  Administered 2022-02-10: 10 g via ORAL
  Filled 2022-02-10: qty 1

## 2022-02-10 NOTE — ED Notes (Signed)
Rn called dietary to request vanilla shake since it still hasn't been tubed- sat on hold 5 min

## 2022-02-10 NOTE — ED Notes (Signed)
Called dietary to request vanilla supplement shake. They stated they would send them

## 2022-02-10 NOTE — ED Notes (Signed)
Went to obtain pt cbg and pt was out of bed, IV was pulled out and in the floor, pt was naked and attempting to put chux on as a robe- assisted pt back to bed, placed clean gown, no bleeding noted from IV site- pt given warm blankets and clean socks- pt denies needs at this time- door remains open for visualization and bed alarm on

## 2022-02-10 NOTE — ED Notes (Signed)
Went to bedside to attempt IV access again- pt was attempting to get out of bed stating she needed to use the bathroom- pt was informed we did not want her to get up d/t her fall risk- pt began yelling and cursing at staff- pt stated "fuck you, this is the last day you are going to be working in my house, you make me sick"- pt was assisted back into the bed by this RN, Sam charge RN, and Jenny Reichmann EDT- pt agreeable to use purwick- pt still very upset and requesting more food, wanting bacon and eggs- pt given warm blanket per her request

## 2022-02-10 NOTE — ED Notes (Signed)
Doroteo Bradford attempted IV access with [place], unsuccessful

## 2022-02-10 NOTE — ED Notes (Signed)
Pt refusing vitals, unable to obtain

## 2022-02-10 NOTE — ED Notes (Signed)
Pt up out of bed trying to leave out of ambulance bay, stating her ride is out there, telling staff to get the "f" off of me and not to touch her, swinging at staff, swearing at staff, staff and security escorted her back into her room, rn secure chat to provider stating pt is out of bed, swung at staff and swearing at staff, provider stated she would put in ativan order, pt out of bed again, stated she didn't want anyone to touch her, stated she would go into the room if she was given a tray and the remote to the tv

## 2022-02-10 NOTE — ED Notes (Signed)
Rn called dietary to request vanilla supplement shake- was on hold 5 min and never spoke to anyone

## 2022-02-10 NOTE — Progress Notes (Signed)
[facility] at [place] NAME: Brittany [name]    MR#:  [id number]  DATE OF BIRTH:  1960-04-16  SUBJECTIVE:   No family at bedside. Patient brought in from [facility] with seizure was found to have sugar down to 30/40. Currently awake alert. Pulled out three IVs. IV team unable to get IV access. Patient tolerating PO's and eating well. Sugars in fact trending up.    VITALS:  Blood pressure (!) 161/99, pulse 86, temperature 98.4 F (36.9 C), temperature source Oral, resp. rate 18, height 5' (1.524 m), weight 54.9 kg, SpO2 99 %.  PHYSICAL EXAMINATION:   GENERAL:  61 y.o.-year-old patient lying in the bed with no acute distress.  LUNGS: Normal breath sounds bilaterally, no wheezing CARDIOVASCULAR: S1, S2 normal. No murmur   ABDOMEN: colostomy present  EXTREMITIES: No  edema b/l.    NEUROLOGIC: nonfocal  patient is alert and awake SKIN: No obvious rash, lesion, or ulcer. LABORATORY PANEL:  CBC Recent Labs  Lab 02/10/22 0750  WBC 5.6  HGB 10.5*  HCT 33.0*  PLT 217    Chemistries  Recent Labs  Lab 02/10/22 0750 02/10/22 1403  NA 139  --   K 6.4* 5.4*  CL 115*  --   CO2 21*  --   GLUCOSE 265*  --   BUN 10  --   CREATININE 0.87  --   CALCIUM 8.3*  --    Cardiac Enzymes No results for input(s): "TROPONINI" in the last 168 hours. RADIOLOGY:  CT HEAD WO CONTRAST (5MM)  Result Date: 02/09/2022 CLINICAL DATA:  Seizure disorder.  Clinical change. EXAM: CT HEAD WITHOUT CONTRAST TECHNIQUE: Contiguous axial images were obtained from the base of the skull through the vertex without intravenous contrast. RADIATION DOSE REDUCTION: This exam was performed according to the departmental dose-optimization program which includes automated exposure control, adjustment of the mA and/or kV according to patient size and/or use of iterative reconstruction technique. COMPARISON:  CT brain 12/10/2021 FINDINGS: Brain: There is mild-to-moderate cortical  atrophy, unchanged from prior and within normal limits for patient age. The ventricles are normal in configuration. The basilar cisterns are patent. There is again low-density and volume loss from a left posterior MCA territory infarct, unchanged. Left cerebellar infarct is also unchanged. No mass, mass effect, or midline shift. No acute intracranial hemorrhage is seen. No abnormal extra-axial fluid collection. Moderate periventricular and subcortical white matter patchy hypodensities are not significant changed, likely chronic ischemic white matter changes. Otherwise, there is preservation of the normal cortical gray-white interface without CT evidence of an acute major vascular territorial cortical based infarction. Vascular: No hyperdense vessel or unexpected calcification. Skull: Normal. Negative for fracture or focal lesion. Sinuses/Orbits: The visualized orbits are unremarkable. The visualized paranasal sinuses and mastoid air cells are clear. Other: None. IMPRESSION: 1. No acute intracranial process. 2. Stable posterior left MCA territory and left cerebellar remote infarcts. 3. Stable moderate chronic ischemic white matter changes. 4. Stable mild-to-moderate cortical atrophy, within normal limits for patient age. Electronically Signed   By: Yvonne Kendall M.D.   On: 02/09/2022 17:59   DG Chest Port 1 View  Result Date: 02/09/2022 CLINICAL DATA:  Sepsis. EXAM: PORTABLE CHEST 1 VIEW COMPARISON:  December 09, 2021. FINDINGS: Stable cardiomediastinal silhouette. Stable right basilar atelectasis or scarring is noted. Left lung is unremarkable. Bony thorax is unremarkable. IMPRESSION: Stable right basilar atelectasis or scarring is noted. Electronically Signed   By: Bobbe Medico.D.  On: 02/09/2022 13:42    Assessment and Plan  Brittany [name] is a 61 y.o. 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 difficile, who presents with seizure.  Per report, patient was found to have hypoglycemia with CBG in the 40s and then dropped to the 30s. Pt was started on D10 drip by EMS. Her CBG on arrival to ED was 141, then increased to 317.    Seizure [facility]): Marland Kitchen  She had several episodes of seizure per report.  It is likely induced by hypoglycemia.  -Seizure precaution -As needed Ativan for seizure -Patient received 500 mg of Keppra in the ED -- no IV access. Patient awake alert will change to PO Depakote  Hyperkalemia -- no IV access despite IV team trying. Patient pulled out three IVs. Gave her nebulizer treatment, care oxalate and Lokelma -- Potassium 6.4-5.4 -- will give additional dose of lokelma later today   Hypoglycemia: Blood sugar responded to treatment quickly, improved to 300s -- now resolved.   Hypothermia: Likely due to hypoglycemia -Bair hugger prn -- resolved   Hypertension -- resume Cardizem   chronic diastolic CHF (congestive heart failure) ([place]): 2D echo on 10/29/2021 showed EF of 55 to 60% with grade 1 diastolic dysfunction.  Patient does not have leg edema JVD.  BNP normal at 40.  CHF is compensated. -Watch volume status closely   Diabetes mellitus with labile sugar control ([place]): Recent A1c 13.2, poorly controlled.   -- Resume Lantus 10 units BID -- patient has very labile blood sugars. Continue to monitor closely.   Depression with anxiety -Continue home medications   COPD (chronic obstructive pulmonary disease) ([place]): Stable -Bronchodilators   Tobacco dependence -Nicotine patch   Elevated lactic acid level: Likely due to hypotension end-stage.  No signs of infection. -Trend lactic acid level -improved labs      Family communication : none today Consults : CODE STATUS:  DVT Prophylaxis : Level of care: Progressive Status is: Observation The patient remains OBS  appropriate and will d/c before 2 midnights. Monitor potassium closely. If sugars and potassium remain stable will plan discharge back to her assisted living facility  [place] THIS PATIENT: 35 minutes.  >50% time spent on counselling and coordination of care  Note: This dictation was prepared with Dragon dictation along with smaller phrase technology. Any transcriptional errors that result from this process are unintentional.  Fritzi Mandes M.D    [facility]   CC: Primary care physician; [name], NP

## 2022-02-10 NOTE — Progress Notes (Signed)
A consult was placed to the hospital's IV Nurse for new access; pt has pulled out previous 3 ivs;  attempted x 1 in LAFA without success;  both arms assessed w ultrasound; no suitable veins noted;  RN and MD aware.

## 2022-02-10 NOTE — ED Notes (Signed)
CBG 463

## 2022-02-10 NOTE — ED Notes (Signed)
Pt sitting at end of bed eating chips and watching tv

## 2022-02-10 NOTE — ED Notes (Signed)
Alf RN at bedside to attempt IV access

## 2022-02-10 NOTE — ED Notes (Signed)
Pt linens changed, ostomy bag changed by this RN and Personal assistant

## 2022-02-10 NOTE — ED Notes (Signed)
Attempted to obtain IV access unsuccessful, lab at bedside to obtain morning labs

## 2022-02-11 DIAGNOSIS — R569 Unspecified convulsions: Secondary | ICD-10-CM | POA: Diagnosis not present

## 2022-02-11 LAB — CBG MONITORING, ED
Glucose-Capillary: 128 mg/dL — ABNORMAL HIGH (ref 70–99)
Glucose-Capillary: 330 mg/dL — ABNORMAL HIGH (ref 70–99)

## 2022-02-11 LAB — GLUCOSE, CAPILLARY
Glucose-Capillary: 162 mg/dL — ABNORMAL HIGH (ref 70–99)
Glucose-Capillary: 188 mg/dL — ABNORMAL HIGH (ref 70–99)

## 2022-02-11 LAB — POTASSIUM: Potassium: 4.5 mmol/L (ref 3.5–5.1)

## 2022-02-11 MED ORDER — LORAZEPAM 2 MG/ML IJ SOLN
1.0000 mg | INTRAMUSCULAR | Status: DC | PRN
  Administered 2022-02-15: 1 mg via INTRAMUSCULAR
  Filled 2022-02-11 (×2): qty 1

## 2022-02-11 MED ORDER — HALOPERIDOL LACTATE 5 MG/ML IJ SOLN
5.0000 mg | Freq: Once | INTRAMUSCULAR | Status: AC
  Administered 2022-02-11: 5 mg via INTRAMUSCULAR
  Filled 2022-02-11: qty 1
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
## 2022-02-11 NOTE — Progress Notes (Signed)
       CROSS COVER NOTE  NAME: Brittany [name] MRN: [id number] DOB : Jul 22, 1960   HPI/Events of Note   Message received from nursing good evening, the pt's sugar is over 460. over 400 it says to contact the MD. She would not take her insulin at lunch time (short acting), but would take long acting, when she was upset. She will take the insulin now, I just need to know how much.   Assessment and  Interventions   Assessment: Has history of early am hypoglycemia Plan: Novolog 15 units  Recheck cbg 0300 X X      Kathlene Cote NP [facility]

## 2022-02-11 NOTE — Progress Notes (Signed)
[place] at [place] NAME: Brittany [name]    MR#:  [id number]  DATE OF BIRTH:  1960/11/30  SUBJECTIVE:   No family at bedside.  Ongoing behavioral issues like each time she comes. Sugars labile (chronic)  VITALS:  Blood pressure 98/78, pulse 93, temperature 98.1 F (36.7 C), temperature source Oral, resp. rate 20, height 5' (1.524 m), weight 54.9 kg, SpO2 96 %.  PHYSICAL EXAMINATION:   GENERAL:  61 y.o.-year-old patient lying in the bed with no acute distress.  LUNGS: Normal breath sounds bilaterally, no wheezing CARDIOVASCULAR: S1, S2 normal. No murmur   ABDOMEN: ostomy present  EXTREMITIES: No  edema b/l.    NEUROLOGIC: nonfocal  patient is alert and awake SKIN: No obvious rash, lesion, or ulcer. LABORATORY PANEL:  CBC Recent Labs  Lab 02/10/22 0750  WBC 5.6  HGB 10.5*  HCT 33.0*  PLT 217     Chemistries  Recent Labs  Lab 02/10/22 0750 02/10/22 1403 02/11/22 0716  NA 139  --   --   K 6.4*   < > 4.5  CL 115*  --   --   CO2 21*  --   --   GLUCOSE 265*  --   --   BUN 10  --   --   CREATININE 0.87  --   --   CALCIUM 8.3*  --   --    < > = values in this interval not displayed.    Cardiac Enzymes No results for input(s): "TROPONINI" in the last 168 hours. RADIOLOGY:  CT HEAD WO CONTRAST (5MM)  Result Date: 02/09/2022 CLINICAL DATA:  Seizure disorder.  Clinical change. EXAM: CT HEAD WITHOUT CONTRAST TECHNIQUE: Contiguous axial images were obtained from the base of the skull through the vertex without intravenous contrast. RADIATION DOSE REDUCTION: This exam was performed according to the departmental dose-optimization program which includes automated exposure control, adjustment of the mA and/or kV according to patient size and/or use of iterative reconstruction technique. COMPARISON:  CT brain 12/10/2021 FINDINGS: Brain: There is mild-to-moderate cortical atrophy, unchanged from prior and within normal limits for  patient age. The ventricles are normal in configuration. The basilar cisterns are patent. There is again low-density and volume loss from a left posterior MCA territory infarct, unchanged. Left cerebellar infarct is also unchanged. No mass, mass effect, or midline shift. No acute intracranial hemorrhage is seen. No abnormal extra-axial fluid collection. Moderate periventricular and subcortical white matter patchy hypodensities are not significant changed, likely chronic ischemic white matter changes. Otherwise, there is preservation of the normal cortical gray-white interface without CT evidence of an acute major vascular territorial cortical based infarction. Vascular: No hyperdense vessel or unexpected calcification. Skull: Normal. Negative for fracture or focal lesion. Sinuses/Orbits: The visualized orbits are unremarkable. The visualized paranasal sinuses and mastoid air cells are clear. Other: None. IMPRESSION: 1. No acute intracranial process. 2. Stable posterior left MCA territory and left cerebellar remote infarcts. 3. Stable moderate chronic ischemic white matter changes. 4. Stable mild-to-moderate cortical atrophy, within normal limits for patient age. Electronically Signed   By: Yvonne Kendall M.D.   On: 02/09/2022 17:59    Assessment and Plan  Brittany [name] is a 61 y.o. 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 difficile, who presents with seizure.  Per report, patient was found to  have hypoglycemia with CBG in the 40s and then dropped to the 30s. Pt was started on D10 drip by EMS. Her CBG on arrival to ED was 141, then increased to 317.    Seizure [facility]): Marland Kitchen  She had several episodes of seizure per report.  It is likely induced by hypoglycemia.  -Seizure precaution -As needed Ativan for seizure -Patient  received 500 mg of Keppra in the ED -- no IV access. Patient awake alert will change to PO Depakote  Hyperkalemia -- no IV access despite IV team trying. Patient pulled out three IVs. Gave her nebulizer treatment, care oxalate and Lokelma -- Potassium 6.4-5.4 -- will give additional dose of lokelma later today   Hypoglycemia: Blood sugar responded to treatment quickly, improved to 300s -- now resolved.   Hypothermia: Likely due to hypoglycemia -Bair hugger prn -- resolved   Hypertension -- resume Cardizem   chronic diastolic CHF (congestive heart failure) ([place]): 2D echo on 10/29/2021 showed EF of 55 to 60% with grade 1 diastolic dysfunction.  Patient does not have leg edema JVD.  BNP normal at 40.  CHF is compensated. -Watch volume status closely   Diabetes mellitus with labile sugar control ([place]): Recent A1c 13.2, poorly controlled.   -- Resume Lantus 10 units BID -- patient has very labile blood sugars. Continue to monitor closely.   Depression with anxiety Behavioral issues -Continue home medications and prn ativan   COPD (chronic obstructive pulmonary disease) ([place]): Stable -Bronchodilators   Tobacco dependence -Nicotine patch   Elevated lactic acid level: Likely due to hypotension end-stage.  No signs of infection. -improved labs      Family communication : none today Consults none: CODE STATUS:  DVT Prophylaxis : Level of care: Progressive Status is: Observation The patient remains OBS appropriate and will d/c before 2 midnights. Monitor potassium closely. If sugars and potassium remain stable will plan discharge back to her assisted living facility  Per [facility] ALF will have to assess her and will not happen till Tuesday  TOTAL TIME TAKING CARE OF THIS PATIENT: 35 minutes.  >50% time spent on counselling and coordination of care  Note: This dictation was prepared with Dragon dictation along with smaller phrase technology. Any transcriptional errors that result  from this process are unintentional.  Fritzi Mandes M.D    [facility]   CC: Primary care physician; [name], NP

## 2022-02-11 NOTE — ED Notes (Addendum)
This nurse walks into the pt's room for a round check and found the pt out of bed, stool from ostomy bag on the floor, and the pt across the room standing eating breakfast. Pt then goes and eat lunch as well. Pt then went to the toilet in the room and ripped off her ostomy bag and tried to throw it on the floor. This nurse took it out of the pt's hand and disposed of the correctly. Pt's meds in the med cup were not touched and was disposed of. A new ostomy bag was placed on the pt and were able to obtain new vitals.

## 2022-02-11 NOTE — ED Notes (Addendum)
Pt refused vital signs stating that she just wants to sleep. Will try again later.  Pt also refused taking meds in front of this nurse. Pt told this nurse to put the cup of meds at bedside and will take them in a few. Before this nurse left the room, this nurse reminded pt to take the meds.

## 2022-02-12 DIAGNOSIS — R569 Unspecified convulsions: Secondary | ICD-10-CM | POA: Diagnosis not present

## 2022-02-12 LAB — GLUCOSE, CAPILLARY
Glucose-Capillary: 221 mg/dL — ABNORMAL HIGH (ref 70–99)
Glucose-Capillary: 198 mg/dL — ABNORMAL HIGH (ref 70–99)
Glucose-Capillary: 310 mg/dL — ABNORMAL HIGH (ref 70–99)
Glucose-Capillary: 378 mg/dL — ABNORMAL HIGH (ref 70–99)

## 2022-02-12 NOTE — TOC Progression Note (Signed)
Transition of Care [facility]) - Progression Note    Patient Details  Name: Brittany [name] MRN: [id number] Date of Birth: 1960-05-29  Transition of Care [facility]) CM/SW [place], [place] Phone Number: 02/12/2022, 9:59 AM  Clinical Narrative:      Per MD patient medically stable to return to [facility].   CSW called [employer], per [employer] she reports patient is not able to be assessed for her return until tomorrow 1/2 due to holiday. MD informed.       Expected Discharge Plan and Services                                               Social Determinants of Health (SDOH) Interventions SDOH Screenings   Food Insecurity: No Food Insecurity (12/30/2021)  Housing: Low Risk  (12/30/2021)  Transportation Needs: No Transportation Needs (12/30/2021)  Utilities: Not At Risk (12/30/2021)  Tobacco Use: High Risk (12/10/2021)    Readmission Risk Interventions    12/29/2021    1:48 PM 09/22/2021    1:24 PM 03/06/2021    9:13 AM  Readmission Risk Prevention Plan  Transportation Screening Complete Complete Complete  Medication Review [employer]) Complete Complete Complete  PCP or Specialist appointment within 3-5 days of discharge  Complete Complete  HRI or Home Care Consult Complete Complete   SW Recovery Care/Counseling Consult Complete Complete   Palliative Care Screening Not Applicable Not Applicable Not [place] Not Applicable Complete Not Applicable

## 2022-02-12 NOTE — Progress Notes (Signed)
[facility] [place] NAME: Brittany [name]    MR#:  [id number]  DATE OF BIRTH:  07-Nov-1960  SUBJECTIVE:   No family at bedside.  Ongoing behavioral issues like each time she comes. Sugars labile (chronic) Asking for more food  VITALS:  Blood pressure 104/79, pulse 83, temperature 98 F (36.7 C), temperature source Oral, resp. rate 18, height 5' (1.524 m), weight 54.9 kg, SpO2 97 %.  PHYSICAL EXAMINATION:   GENERAL:  62 y.o.-year-old patient lying in the bed with no acute distress.  LUNGS: Normal breath sounds bilaterally, no wheezing CARDIOVASCULAR: S1, S2 normal. No murmur   ABDOMEN: ostomy present  EXTREMITIES: No  edema b/l.    NEUROLOGIC: nonfocal  patient is alert and awake SKIN: No obvious rash, lesion, or ulcer. LABORATORY PANEL:  CBC Recent Labs  Lab 02/10/22 0750  WBC 5.6  HGB 10.5*  HCT 33.0*  PLT 217     Chemistries  Recent Labs  Lab 02/10/22 0750 02/10/22 1403 02/11/22 0716  NA 139  --   --   K 6.4*   < > 4.5  CL 115*  --   --   CO2 21*  --   --   GLUCOSE 265*  --   --   BUN 10  --   --   CREATININE 0.87  --   --   CALCIUM 8.3*  --   --    < > = values in this interval not displayed.    Cardiac Enzymes No results for input(s): "TROPONINI" in the last 168 hours. RADIOLOGY:  No results found.  Assessment and Plan  Brittany [name] is a 62 y.o. 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 difficile, who presents with seizure.  Per report, patient was found to have hypoglycemia with CBG in the 40s and then dropped to the 30s. Pt was started on D10 drip by EMS. Her CBG on arrival to ED was 141, then increased to 317.    Seizure [facility]): Marland Kitchen  She had several episodes of seizure per report.  It is likely induced by  hypoglycemia.  -Seizure precaution -As needed Ativan for seizure -Patient received 500 mg of Keppra in the ED -- no IV access. Patient awake alert will change to PO Depakote  Diabetes mellitus with labile sugar control ([place]): Recent A1c 13.2, poorly controlled.   -- Resume Lantus 10 units BID -- patient has very labile blood sugars. Continue to monitor closely.  Hyperkalemia -- no IV access despite IV team trying. Patient pulled out three IVs. Gave her nebulizer treatment, care oxalate and Lokelma -- Potassium 6.4-5.4 -- will give additional dose of lokelma later today   Hypoglycemia: Blood sugar responded to treatment quickly, improved to 300s -- now resolved.   Hypothermia: Likely due to hypoglycemia -Bair hugger prn -- resolved   Hypertension -- resume Cardizem   chronic diastolic CHF (congestive heart failure) ([place]): 2D echo on 10/29/2021 showed EF of 55 to 60% with grade 1 diastolic dysfunction.  Patient does not have leg edema JVD.  BNP normal at 40.  CHF is compensated. -Watch volume status closely  Depression with anxiety Behavioral issues -Continue home medications and prn ativan   COPD (chronic obstructive pulmonary disease) ([place]): Stable -Bronchodilators   Tobacco dependence -Nicotine patch   Elevated lactic acid level: Likely  due to hypotension end-stage.  No signs of infection. -improved labs      Family communication : none today Consults none: CODE STATUS:  DVT Prophylaxis : Level of care: Progressive Status is: Observation The patient remains OBS appropriate and will d/c before 2 midnights. Per TOC ALF will have to assess her and will not happen till Tuesday  TOTAL TIME TAKING CARE OF THIS PATIENT: 25 minutes.  >50% time spent on counselling and coordination of care  Note: This dictation was prepared with Dragon dictation along with smaller phrase technology. Any transcriptional errors that result from this process are unintentional.  Fritzi Mandes  M.D    [facility]   CC: Primary care physician; [name], NP

## 2022-02-13 DIAGNOSIS — R569 Unspecified convulsions: Secondary | ICD-10-CM | POA: Diagnosis not present

## 2022-02-13 LAB — GLUCOSE, CAPILLARY
Glucose-Capillary: 186 mg/dL — ABNORMAL HIGH (ref 70–99)
Glucose-Capillary: 182 mg/dL — ABNORMAL HIGH (ref 70–99)
Glucose-Capillary: 239 mg/dL — ABNORMAL HIGH (ref 70–99)
Glucose-Capillary: 369 mg/dL — ABNORMAL HIGH (ref 70–99)

## 2022-02-13 NOTE — Progress Notes (Signed)
[place] [facility] [place] NAME: Brittany [name]    MR#:  [id number]  DATE OF BIRTH:  1960/08/06  SUBJECTIVE:   No family at bedside.  Sugars labile (chronic)--more stable for now   VITALS:  Blood pressure (!) 141/99, pulse 87, temperature 97.8 F (36.6 C), temperature source Oral, resp. rate 17, height 5' (1.524 m), weight 52.4 kg, SpO2 100 %.  PHYSICAL EXAMINATION:   GENERAL:  62 y.o.-year-old patient lying in the bed with no acute distress.  LUNGS: Normal breath sounds bilaterally, no wheezing CARDIOVASCULAR: S1, S2 normal. No murmur   ABDOMEN: ostomy present  EXTREMITIES: No  edema b/l.    NEUROLOGIC: nonfocal  patient is alert and awake SKIN: No obvious rash, lesion, or ulcer. LABORATORY PANEL:  CBC Recent Labs  Lab 02/10/22 0750  WBC 5.6  HGB 10.5*  HCT 33.0*  PLT 217     Chemistries  Recent Labs  Lab 02/10/22 0750 02/10/22 1403 02/11/22 0716  NA 139  --   --   K 6.4*   < > 4.5  CL 115*  --   --   CO2 21*  --   --   GLUCOSE 265*  --   --   BUN 10  --   --   CREATININE 0.87  --   --   CALCIUM 8.3*  --   --    < > = values in this interval not displayed.    Assessment and Plan  Brittany [name] is a 62 y.o. 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 difficile, who presents with seizure.  Per report, patient was found to have hypoglycemia with CBG in the 40s and then dropped to the 30s. Pt was started on D10 drip by EMS. Her CBG on arrival to ED was 141, then increased to 317.    Seizure [facility]): Marland Kitchen  She had several episodes of seizure per report.  It is likely induced by hypoglycemia.  -Seizure precaution -As needed Ativan for seizure -- no IV access. --cont PO Depakote --no seizures since admission  Diabetes mellitus with labile  sugar control ([place]): Recent A1c 13.2, poorly controlled.   -- Resume Lantus 10 units BID -- patient has very labile blood sugars.   Hyperkalemia -- no IV access despite IV team trying. Patient pulled out three IVs. Gave her nebulizer treatment, care oxalate and Lokelma -- Potassium 6.4-5.4 --4.5    Hypothermia: Likely due to hypoglycemia -Bair hugger prn -- resolved   Hypertension -- resume Cardizem   chronic diastolic CHF (congestive heart failure) ([place]): 2D echo on 10/29/2021 showed EF of 55 to 60% with grade 1 diastolic dysfunction.  Patient does not have leg edema JVD.  BNP normal at 40.  CHF is compensated. -Watch volume status closely  Depression with anxiety Behavioral issues -Continue home medications and prn ativan   COPD (chronic obstructive pulmonary disease) ([place]): Stable -Bronchodilators   Tobacco dependence -Nicotine patch   Elevated lactic acid level: Likely due to hypotension end-stage.  No signs of infection. -improved labs      Family communication : none today Consults none: CODE STATUS:full  DVT Prophylaxis :lovenox Level of care: Progressive Status is: Observation The patient remains OBS appropriate and will d/c before 2 midnights. Per TOC ALF will have to assess her and will not happen till  Tuesday  TOTAL TIME TAKING CARE OF THIS PATIENT: 25 minutes.  >50% time spent on counselling and coordination of care  Note: This dictation was prepared with Dragon dictation along with smaller phrase technology. Any transcriptional errors that result from this process are unintentional.  Brittany [name] M.D    [facility]   CC: Primary care physician; [name], NP

## 2022-02-13 NOTE — TOC Progression Note (Signed)
Transition of Care [facility]) - Progression Note    Patient Details  Name: Brittany [name] MRN: [id number] Date of Birth: 1960-08-04  Transition of Care [facility]) CM/SW Contact  Laurena Slimmer, RN Phone Number: 02/13/2022, 10:31 AM  Clinical Narrative:     Damaris Schooner with Renee at [facility] to confirm plans for onsite assessment. Renee stated administration was currently in a meeting. Left a message requesting return call to this RNCM to confirm appointment today and to inform of discharge readiness.         Expected Discharge Plan and Services                                               Social Determinants of Health (SDOH) Interventions SDOH Screenings   Food Insecurity: No Food Insecurity (12/30/2021)  Housing: Low Risk  (12/30/2021)  Transportation Needs: No Transportation Needs (12/30/2021)  Utilities: Not At Risk (12/30/2021)  Tobacco Use: High Risk (12/10/2021)    Readmission Risk Interventions    12/29/2021    1:48 PM 09/22/2021    1:24 PM 03/06/2021    9:13 AM  Readmission Risk Prevention Plan  Transportation Screening Complete Complete Complete  Medication Review Press photographer) Complete Complete Complete  PCP or Specialist appointment within 3-5 days of discharge  Complete Complete  HRI or Home Care Consult Complete Complete   SW Recovery Care/Counseling Consult Complete Complete   Palliative Care Screening Not Applicable Not Applicable Not [place] Not Applicable Complete Not Applicable

## 2022-02-13 NOTE — Inpatient Diabetes Management (Signed)
Inpatient Diabetes Program Recommendations  AACE/ADA: New Consensus Statement on Inpatient Glycemic Control   Target Ranges:  Prepandial:   less than 140 mg/dL      Peak postprandial:   less than 180 mg/dL (1-2 hours)      Critically ill patients:  140 - 180 mg/dL    Latest Reference Range & Units 02/12/22 07:46 02/12/22 11:42 02/12/22 16:17 02/12/22 21:56 02/13/22 08:27  Glucose-Capillary 70 - 99 mg/dL 221 (H) 198 (H) 310 (H) 378 (H) 186 (H)   Review of Glycemic Control  Diabetes history: DM1 (does NOT make any insulin; requires basal, correction, and carb coverage insulin) Outpatient Diabetes medications: Lantus 18 units daily, Novolog 3 units TID with meals, Metformin 1000 mg BID Current orders for Inpatient glycemic control: Semglee 10 units BID, Novolog 0-9 units TID with meals, Novolog 0-5 units QHS  Inpatient Diabetes Program Recommendations:    Insulin: Please consider ordering Novolog 3 units TID with meals for meal coverage if patient eats at least 50% of meals.  Thanks, Barnie Alderman, RN, MSN, [place] Diabetes Coordinator Inpatient Diabetes Program [phone] (Team Pager from 8am to [place])

## 2022-02-14 DIAGNOSIS — E119 Type 2 diabetes mellitus without complications: Secondary | ICD-10-CM | POA: Diagnosis not present

## 2022-02-14 DIAGNOSIS — G9341 Metabolic encephalopathy: Secondary | ICD-10-CM

## 2022-02-14 DIAGNOSIS — I959 Hypotension, unspecified: Secondary | ICD-10-CM | POA: Diagnosis not present

## 2022-02-14 DIAGNOSIS — R569 Unspecified convulsions: Secondary | ICD-10-CM | POA: Diagnosis not present

## 2022-02-14 LAB — BASIC METABOLIC PANEL
Anion gap: 6 (ref 5–15)
BUN: 20 mg/dL (ref 8–23)
CO2: 24 mmol/L (ref 22–32)
Calcium: 8.6 mg/dL — ABNORMAL LOW (ref 8.9–10.3)
Chloride: 109 mmol/L (ref 98–111)
Creatinine, Ser: 0.92 mg/dL (ref 0.44–1.00)
GFR, Estimated: >60 mL/min (ref >60)
Glucose, Bld: 196 mg/dL — ABNORMAL HIGH (ref 70–99)
Potassium: 5.6 mmol/L — ABNORMAL HIGH (ref 3.5–5.1)
Sodium: 139 mmol/L (ref 135–145)

## 2022-02-14 LAB — CULTURE, BLOOD (ROUTINE X 2)
Culture: NO GROWTH
Culture: NO GROWTH
Special Requests: ADEQUATE

## 2022-02-14 LAB — GLUCOSE, CAPILLARY
Glucose-Capillary: 264 mg/dL — ABNORMAL HIGH (ref 70–99)
Glucose-Capillary: 96 mg/dL (ref 70–99)
Glucose-Capillary: 183 mg/dL — ABNORMAL HIGH (ref 70–99)
Glucose-Capillary: 328 mg/dL — ABNORMAL HIGH (ref 70–99)

## 2022-02-14 MED ORDER — SODIUM ZIRCONIUM CYCLOSILICATE 10 G PO PACK
10.0000 g | PACK | Freq: Two times a day (BID) | ORAL | Status: AC
  Administered 2022-02-14 (×2): 10 g via ORAL
  Filled 2022-02-14 (×2): qty 1

## 2022-02-14 MED ORDER — SODIUM CHLORIDE 0.9 % IV BOLUS
500.0000 mL | Freq: Once | INTRAVENOUS | Status: AC
  Administered 2022-02-14: 500 mL via INTRAVENOUS

## 2022-02-14 NOTE — Progress Notes (Signed)
Patient is lethargic, briefly arouses to voice. BP 89/67 map 76. Dr. Mal Misty aware.

## 2022-02-14 NOTE — TOC Progression Note (Signed)
Transition of Care [facility]) - Progression Note    Patient Details  Name: DALYLA CHUI MRN: [id number] Date of Birth: 01-25-1961  Transition of Care [facility]) CM/SW Contact  Laurena Slimmer, RN Phone Number: 02/14/2022, 1:16 PM  Clinical Narrative:    Call [place] to determine if assessement had been completed. Per Lajoyce Lauber, telephone representative at [facility] are currently in a meeting. Message left requesting return call to this RNCM.         Expected Discharge Plan and Services                                               Social Determinants of Health (SDOH) Interventions SDOH Screenings   Food Insecurity: No Food Insecurity (12/30/2021)  Housing: Low Risk  (12/30/2021)  Transportation Needs: No Transportation Needs (12/30/2021)  Utilities: Not At Risk (12/30/2021)  Tobacco Use: High Risk (12/10/2021)    Readmission Risk Interventions    12/29/2021    1:48 PM 09/22/2021    1:24 PM 03/06/2021    9:13 AM  Readmission Risk Prevention Plan  Transportation Screening Complete Complete Complete  Medication Review Press photographer) Complete Complete Complete  PCP or Specialist appointment within 3-5 days of discharge  Complete Complete  HRI or Home Care Consult Complete Complete   SW Recovery Care/Counseling Consult Complete Complete   Palliative Care Screening Not Applicable Not Applicable Not [place] Not Applicable Complete Not Applicable

## 2022-02-14 NOTE — Progress Notes (Addendum)
Progress Note    GLENDALE [name]  DGL:[id number] DOB: 1961/02/06  DOA: 02/09/2022 PCP: [name], NP      Brief Narrative:    Medical records reviewed and are as summarized below:  Brittany [name] is a 62 y.o. 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 difficile infection, who presented to the hospital with hypoglycemia and seizures.  Reportedly, glucose levels was under 40s and dropped into the 30s.  He was started on 10% dextrose infusion by EMS and brought to the emergency department.        Assessment/Plan:   Principal Problem:   Seizure ([place]) Active Problems:   Acute metabolic encephalopathy   Hypoglycemia   Hypothermia   Hypotension   Chronic diastolic CHF (congestive heart failure) (HCC)   Diabetes mellitus without complication (H[facility])   History of stroke   HLD (hyperlipidemia)   Depression with anxiety   COPD (chronic obstructive pulmonary disease) (H[facility])   Tobacco dependence   Elevated lactic acid level   HTN (hypertension)    Body mass index is 22.56 kg/m.   Seizures prior to admission: This was probably due to hypoglycemia.  No seizures on admission.  Continue Depakote.  IV Ativan as needed for seizure.  Type II DM with hyperglycemia, recent hypoglycemia, labile glucose levels: Continue insulin glargine and NovoLog.  Hemoglobin A1c was 13.2 on 10/30/2021.  Repeat hemoglobin A1c.  Recurrent hyperkalemia: Treated with Lokelma.  Repeat BMP tomorrow  Hypotension: Treated with normal saline 500 mL bolus.  Monitor BP closely.  Check cortisol level tomorrow morning  Acute metabolic encephalopathy, lethargy: Continue supportive care.  IV fluids has been ordered.  Hypothermia: Resolved  Other comorbidities include chronic diastolic CHF, depression,  anxiety, hypertension, tobacco use disorder  Elevated lactic acid level: Chart review shows patient has had elevated lactic acid levels in the past  Diet Order             Diet heart healthy/carb modified Room service appropriate? Yes; Fluid consistency: Thin  Diet effective now                            Consultants: None  Procedures: None    Medications:    aspirin EC  81 mg Oral Daily   atorvastatin  40 mg Oral QHS   buPROPion  150 mg Oral Daily   diltiazem  120 mg Oral Daily   divalproex  500 mg Oral BID   enoxaparin (LOVENOX) injection  40 mg Subcutaneous Q24H   famotidine  20 mg Oral Daily   feeding supplement (GLUCERNA SHAKE)  237 mL Oral TID BM   insulin aspart  0-5 Units Subcutaneous QHS   insulin aspart  0-9 Units Subcutaneous TID WC   insulin glargine-yfgn  10 Units Subcutaneous BID   melatonin  2.5 mg Oral QHS   mirtazapine  15 mg Oral QHS   multivitamin with minerals  1 tablet Oral Daily   nicotine  21 mg Transdermal Daily   QUEtiapine  50 mg Oral QHS   rOPINIRole  0.5 mg Oral QHS   sodium zirconium cyclosilicate  10 g Oral BID   Continuous Infusions:   Anti-infectives (From admission, onward)    Start     Dose/Rate Route Frequency Ordered Stop   02/09/22 1345  piperacillin-tazobactam (ZOSYN) IVPB  3.375 g        3.375 g 100 mL/hr over 30 Minutes Intravenous  Once 02/09/22 1336 02/09/22 1518              Family Communication/Anticipated D/C date and plan/Code Status   DVT prophylaxis: enoxaparin (LOVENOX) injection 40 mg Start: 02/09/22 1600     Code Status: Prior  Family Communication: None Disposition Plan: Plan to discharge home in 1 to 2 days   Status is: Observation The patient will require care spanning > 2 midnights and should be moved to inpatient because: Hypotension, change in mental status       Subjective:   Interval events noted.  She is confused and unable to provide any history.  [place], RN, at  the bedside  Objective:    Vitals:   02/13/22 2341 02/14/22 0319 02/14/22 0735 02/14/22 1147  BP: 103/71 92/69 102/73 (!) 89/67  Pulse: 93 79 81 87  Resp: '18 18 18 19  '$ Temp: 97.7 F (36.5 C) 98.7 F (37.1 C) 98 F (36.7 C) 98.1 F (36.7 C)  TempSrc:      SpO2: 97% 97% 99% 96%  Weight:      Height:       No data found.   Intake/Output Summary (Last 24 hours) at 02/14/2022 1545 Last data filed at 02/14/2022 1200 Gross per 24 hour  Intake 477 ml  Output 2000 ml  Net -1523 ml   Filed Weights   02/09/22 1345 02/13/22 0324  Weight: 54.9 kg 52.4 kg    Exam:   GEN: NAD SKIN: Warm and dry EYES: No pallor or icterus ENT: MMM CV: RRR PULM: CTA B ABD: soft, ND, NT, +BS CNS: Lethargic, confused, oriented to person only, non focal EXT: No edema or tenderness      Data Reviewed:   I have personally reviewed following labs and imaging studies:  Labs: Labs show the following:   Basic Metabolic Panel: Recent Labs  Lab 02/09/22 1331 02/10/22 0750 02/10/22 1403 02/11/22 0716 02/14/22 0922  NA 141 139  --   --  139  K 4.5 6.4*   < > 4.5 5.6*  CL 115* 115*  --   --  109  CO2 21* 21*  --   --  24  GLUCOSE 164* 265*  --   --  196*  BUN 14 10  --   --  20  CREATININE 0.65 0.87  --   --  0.92  CALCIUM 8.6* 8.3*  --   --  8.6*   < > = values in this interval not displayed.   GFR Estimated Creatinine Clearance: 45.5 mL/min (by C-G formula based on SCr of 0.92 mg/dL). Liver Function Tests: No results for input(s): "AST", "ALT", "ALKPHOS", "BILITOT", "PROT", "ALBUMIN" in the last 168 hours. No results for input(s): "LIPASE", "AMYLASE" in the last 168 hours. No results for input(s): "AMMONIA" in the last 168 hours. Coagulation profile Recent Labs  Lab 02/09/22 1331  INR 1.2    CBC: Recent Labs  Lab 02/09/22 1331 02/10/22 0750  WBC 6.4 5.6  HGB 10.0* 10.5*  HCT 31.8* 33.0*  MCV 88.6 87.8  PLT 213 217   Cardiac Enzymes: No results for input(s):  "CKTOTAL", "CKMB", "CKMBINDEX", "TROPONINI" in the last 168 hours. BNP (last 3 results) No results for input(s): "PROBNP" in the last 8760 hours. CBG: Recent Labs  Lab 02/13/22 1118 02/13/22 1652 02/13/22 1954 02/14/22 0815 02/14/22 1217  GLUCAP 182* 239* 369* 264* 96  D-Dimer: No results for input(s): "DDIMER" in the last 72 hours. Hgb A1c: No results for input(s): "HGBA1C" in the last 72 hours. Lipid Profile: No results for input(s): "CHOL", "HDL", "LDLCALC", "TRIG", "CHOLHDL", "LDLDIRECT" in the last 72 hours. Thyroid function studies: No results for input(s): "TSH", "T4TOTAL", "T3FREE", "THYROIDAB" in the last 72 hours.  Invalid input(s): "FREET3" Anemia work up: No results for input(s): "VITAMINB12", "FOLATE", "FERRITIN", "TIBC", "IRON", "RETICCTPCT" in the last 72 hours. Sepsis Labs: Recent Labs  Lab 02/09/22 1331 02/09/22 1523 02/09/22 1530 02/09/22 1903 02/10/22 0750  PROCALCITON  --  <0.10  --   --   --   WBC 6.4  --   --   --  5.6  LATICACIDVEN 2.3*  --  4.2* 3.7* 2.1*    Microbiology Recent Results (from the past 240 hour(s))  Urine Culture     Status: None   Collection Time: 02/09/22  1:27 PM   Specimen: In/Out Cath Urine  Result Value Ref Range Status   Specimen Description   Final    IN/OUT CATH URINE Performed at [facility], [address], [place], [place] [address]    Special Requests   Final    NONE Performed at [facility], [address] [place], [place] [address]    Culture   Final    NO GROWTH Performed at [facility], [place] [address] [place], [place] [address]    Report Status 02/10/2022 FINAL  Final  Blood Culture (routine x 2)     Status: None   Collection Time: 02/09/22  1:31 PM   Specimen: BLOOD  Result Value Ref Range Status   Specimen Description BLOOD LEFT WRIST  Final   Special Requests   Final    BOTTLES DRAWN AEROBIC AND ANAEROBIC Blood Culture adequate volume   Culture   Final    NO  GROWTH 5 DAYS Performed at [facility], [address], [place], [place] [address]    Report Status 02/14/2022 FINAL  Final  Resp Panel by RT-PCR (Flu A&B, Covid) Anterior Nasal Swab     Status: None   Collection Time: 02/09/22  1:31 PM   Specimen: Anterior Nasal Swab  Result Value Ref Range Status   SARS Coronavirus 2 by RT PCR NEGATIVE NEGATIVE Final    Comment: (NOTE) SARS-CoV-2 target nucleic acids are NOT DETECTED.  The SARS-CoV-2 RNA is generally detectable in upper respiratory specimens during the acute phase of infection. The lowest concentration of SARS-CoV-2 viral copies this assay can detect is 138 copies/mL. A negative result does not preclude SARS-Cov-2 infection and should not be used as the sole basis for treatment or other patient management decisions. A negative result may occur with  improper specimen collection/handling, submission of specimen other than nasopharyngeal swab, presence of viral mutation(s) within the areas targeted by this assay, and inadequate number of viral copies(<138 copies/mL). A negative result must be combined with clinical observations, patient history, and epidemiological information. The expected result is Negative.  Fact Sheet for Patients:  EntrepreneurPulse.com.au  Fact Sheet for Healthcare Providers:  IncredibleEmployment.be  This test is no t yet approved or cleared by the [place] FDA and  has been authorized for detection and/or diagnosis of SARS-CoV-2 by FDA under an Emergency Use Authorization (EUA). This EUA will remain  in effect (meaning this test can be used) for the duration of the COVID-19 declaration under Section 564(b)(1) of the Act, 21 U.S.C.section 360bbb-3(b)(1), unless the authorization is terminated  or revoked sooner.       Influenza A by PCR NEGATIVE NEGATIVE Final   Influenza B by PCR NEGATIVE NEGATIVE Final    Comment: (NOTE) The Xpert Xpress  SARS-CoV-2/FLU/RSV plus assay is intended as an aid in the diagnosis of influenza from Nasopharyngeal swab specimens and should not be used as a sole basis for treatment. Nasal washings and aspirates are unacceptable for Xpert Xpress SARS-CoV-2/FLU/RSV testing.  Fact Sheet for Patients: EntrepreneurPulse.com.au  Fact Sheet for Healthcare Providers: IncredibleEmployment.be  This test is not yet approved or cleared by the [place] FDA and has been authorized for detection and/or diagnosis of SARS-CoV-2 by FDA under an Emergency Use Authorization (EUA). This EUA will remain in effect (meaning this test can be used) for the duration of the COVID-19 declaration under Section 564(b)(1) of the Act, 21 U.S.C. section 360bbb-3(b)(1), unless the authorization is terminated or revoked.  Performed at [facility], [address] [place], [place] [address]   Blood Culture (routine x 2)     Status: None   Collection Time: 02/09/22  3:30 PM   Specimen: BLOOD RIGHT HAND  Result Value Ref Range Status   Specimen Description BLOOD RIGHT HAND  Final   Special Requests   Final    BOTTLES DRAWN AEROBIC AND ANAEROBIC Blood Culture results may not be optimal due to an inadequate volume of blood received in culture bottles   Culture   Final    NO GROWTH 5 DAYS Performed at [facility], [address] [place], [place] [address]    Report Status 02/14/2022 FINAL  Final    Procedures and diagnostic studies:  No results found.             LOS: 0 days   Amilee Janvier  [facility]   Pager on [email or web address]. If 7PM-7AM, please contact night-coverage at [email or web address]     02/14/2022, 3:45 PM

## 2022-02-14 NOTE — Progress Notes (Signed)
Mobility Specialist - Progress Note   02/14/22 1200  Mobility  Activity Contraindicated/medical hold     Pt not appropriate for mobility at this time d/t low BP 89/67 and elevated K+ of 5.6. Will hold for now and attempt session when medically appropriate.    Kathee Delton Mobility Specialist 02/14/22, 12:18 PM

## 2022-02-14 NOTE — Inpatient Diabetes Management (Signed)
Inpatient Diabetes Program Recommendations  AACE/ADA: New Consensus Statement on Inpatient Glycemic Control   Target Ranges:  Prepandial:   less than 140 mg/dL      Peak postprandial:   less than 180 mg/dL (1-2 hours)      Critically ill patients:  140 - 180 mg/dL    Latest Reference Range & Units 02/13/22 08:27 02/13/22 11:18 02/13/22 16:52 02/13/22 19:54 02/14/22 08:15  Glucose-Capillary 70 - 99 mg/dL 186 (H) 182 (H) 239 (H) 369 (H) 264 (H)   Review of Glycemic Control  Diabetes history: DM1 (does NOT make any insulin; requires basal, correction, and carb coverage insulin) Outpatient Diabetes medications: Lantus 18 units daily, Novolog 3 units TID with meals, Metformin 1000 mg BID Current orders for Inpatient glycemic control: Semglee 10 units BID, Novolog 0-9 units TID with meals, Novolog 0-5 units QHS   Inpatient Diabetes Program Recommendations:     Insulin: Please consider increasing Semglee to 11 units BID and ordering Novolog 3 units TID with meals for meal coverage if patient eats at least 50% of meals.   Thanks, Barnie Alderman, RN, MSN, [place] Diabetes Coordinator Inpatient Diabetes Program [phone] (Team Pager from 8am to [place])

## 2022-02-14 NOTE — TOC Progression Note (Signed)
Transition of Care [facility]) - Progression Note    Patient Details  Name: Brittany [name] MRN: [id number] Date of Birth: 06-26-1960  Transition of Care [facility]) CM/SW Contact  Laurena Slimmer, RN Phone Number: 02/14/2022, 9:19 AM  Clinical Narrative:    Call placed to [facility]. Spoke with phone attendant Colletta [place]. Left a message requesting update on status of assessment that was scheduled to be completed by facility 02/13/2022.         Expected Discharge Plan and Services                                               Social Determinants of Health (SDOH) Interventions SDOH Screenings   Food Insecurity: No Food Insecurity (12/30/2021)  Housing: Low Risk  (12/30/2021)  Transportation Needs: No Transportation Needs (12/30/2021)  Utilities: Not At Risk (12/30/2021)  Tobacco Use: High Risk (12/10/2021)    Readmission Risk Interventions    12/29/2021    1:48 PM 09/22/2021    1:24 PM 03/06/2021    9:13 AM  Readmission Risk Prevention Plan  Transportation Screening Complete Complete Complete  Medication Review Press photographer) Complete Complete Complete  PCP or Specialist appointment within 3-5 days of discharge  Complete Complete  HRI or Home Care Consult Complete Complete   SW Recovery Care/Counseling Consult Complete Complete   Palliative Care Screening Not Applicable Not Applicable Not [place] Not Applicable Complete Not Applicable

## 2022-02-15 DIAGNOSIS — G40909 Epilepsy, unspecified, not intractable, without status epilepticus: Secondary | ICD-10-CM | POA: Diagnosis present

## 2022-02-15 DIAGNOSIS — F418 Other specified anxiety disorders: Secondary | ICD-10-CM | POA: Diagnosis present

## 2022-02-15 DIAGNOSIS — E1165 Type 2 diabetes mellitus with hyperglycemia: Secondary | ICD-10-CM | POA: Diagnosis not present

## 2022-02-15 DIAGNOSIS — E875 Hyperkalemia: Secondary | ICD-10-CM

## 2022-02-15 DIAGNOSIS — E162 Hypoglycemia, unspecified: Secondary | ICD-10-CM | POA: Diagnosis not present

## 2022-02-15 DIAGNOSIS — Z79899 Other long term (current) drug therapy: Secondary | ICD-10-CM | POA: Diagnosis not present

## 2022-02-15 DIAGNOSIS — Z1152 Encounter for screening for COVID-19: Secondary | ICD-10-CM | POA: Diagnosis not present

## 2022-02-15 DIAGNOSIS — Z794 Long term (current) use of insulin: Secondary | ICD-10-CM | POA: Diagnosis not present

## 2022-02-15 DIAGNOSIS — N2589 Other disorders resulting from impaired renal tubular function: Secondary | ICD-10-CM | POA: Diagnosis present

## 2022-02-15 DIAGNOSIS — R569 Unspecified convulsions: Secondary | ICD-10-CM | POA: Diagnosis not present

## 2022-02-15 DIAGNOSIS — I959 Hypotension, unspecified: Secondary | ICD-10-CM | POA: Diagnosis present

## 2022-02-15 DIAGNOSIS — Z9081 Acquired absence of spleen: Secondary | ICD-10-CM | POA: Diagnosis not present

## 2022-02-15 DIAGNOSIS — E872 Acidosis, unspecified: Secondary | ICD-10-CM | POA: Diagnosis present

## 2022-02-15 DIAGNOSIS — F17210 Nicotine dependence, cigarettes, uncomplicated: Secondary | ICD-10-CM | POA: Diagnosis present

## 2022-02-15 DIAGNOSIS — Z933 Colostomy status: Secondary | ICD-10-CM | POA: Diagnosis not present

## 2022-02-15 DIAGNOSIS — F0394 Unspecified dementia, unspecified severity, with anxiety: Secondary | ICD-10-CM | POA: Diagnosis present

## 2022-02-15 DIAGNOSIS — J449 Chronic obstructive pulmonary disease, unspecified: Secondary | ICD-10-CM | POA: Diagnosis present

## 2022-02-15 DIAGNOSIS — G2581 Restless legs syndrome: Secondary | ICD-10-CM | POA: Diagnosis present

## 2022-02-15 DIAGNOSIS — F0393 Unspecified dementia, unspecified severity, with mood disturbance: Secondary | ICD-10-CM | POA: Diagnosis present

## 2022-02-15 DIAGNOSIS — I110 Hypertensive heart disease with heart failure: Secondary | ICD-10-CM | POA: Diagnosis present

## 2022-02-15 DIAGNOSIS — R6510 Systemic inflammatory response syndrome (SIRS) of non-infectious origin without acute organ dysfunction: Secondary | ICD-10-CM | POA: Diagnosis present

## 2022-02-15 DIAGNOSIS — E11649 Type 2 diabetes mellitus with hypoglycemia without coma: Secondary | ICD-10-CM | POA: Diagnosis present

## 2022-02-15 DIAGNOSIS — E785 Hyperlipidemia, unspecified: Secondary | ICD-10-CM | POA: Diagnosis present

## 2022-02-15 DIAGNOSIS — G9341 Metabolic encephalopathy: Secondary | ICD-10-CM | POA: Diagnosis present

## 2022-02-15 DIAGNOSIS — I5032 Chronic diastolic (congestive) heart failure: Secondary | ICD-10-CM | POA: Diagnosis present

## 2022-02-15 DIAGNOSIS — E860 Dehydration: Secondary | ICD-10-CM | POA: Diagnosis present

## 2022-02-15 LAB — GLUCOSE, CAPILLARY
Glucose-Capillary: 344 mg/dL — ABNORMAL HIGH (ref 70–99)
Glucose-Capillary: 270 mg/dL — ABNORMAL HIGH (ref 70–99)
Glucose-Capillary: 190 mg/dL — ABNORMAL HIGH (ref 70–99)
Glucose-Capillary: 164 mg/dL — ABNORMAL HIGH (ref 70–99)

## 2022-02-15 LAB — CBC WITH DIFFERENTIAL/PLATELET
Abs Immature Granulocytes: 0.04 10*3/uL (ref 0.00–0.07)
Basophils Absolute: 0 10*3/uL (ref 0.0–0.1)
Basophils Relative: 1 %
Eosinophils Absolute: 0.1 10*3/uL (ref 0.0–0.5)
Eosinophils Relative: 1 %
HCT: 33.7 % — ABNORMAL LOW (ref 36.0–46.0)
Hemoglobin: 10.7 g/dL — ABNORMAL LOW (ref 12.0–15.0)
Immature Granulocytes: 1 %
Lymphocytes Relative: 54 %
Lymphs Abs: 4.1 10*3/uL — ABNORMAL HIGH (ref 0.7–4.0)
MCH: 27.9 pg (ref 26.0–34.0)
MCHC: 31.8 g/dL (ref 30.0–36.0)
MCV: 88 fL (ref 80.0–100.0)
Monocytes Absolute: 0.7 10*3/uL (ref 0.1–1.0)
Monocytes Relative: 10 %
Neutro Abs: 2.4 10*3/uL (ref 1.7–7.7)
Neutrophils Relative %: 33 %
Platelets: 281 10*3/uL (ref 150–400)
RBC: 3.83 MIL/uL — ABNORMAL LOW (ref 3.87–5.11)
RDW: 21.8 % — ABNORMAL HIGH (ref 11.5–15.5)
Smear Review: NORMAL
WBC: 7.4 10*3/uL (ref 4.0–10.5)
nRBC: 0.5 % — ABNORMAL HIGH (ref 0.0–0.2)

## 2022-02-15 LAB — HEMOGLOBIN A1C
Hgb A1c MFr Bld: 11 % — ABNORMAL HIGH (ref 4.8–5.6)
Mean Plasma Glucose: 269 mg/dL

## 2022-02-15 LAB — BASIC METABOLIC PANEL
Anion gap: 6 (ref 5–15)
BUN: 25 mg/dL — ABNORMAL HIGH (ref 8–23)
CO2: 23 mmol/L (ref 22–32)
Calcium: 8.5 mg/dL — ABNORMAL LOW (ref 8.9–10.3)
Chloride: 108 mmol/L (ref 98–111)
Creatinine, Ser: 1 mg/dL (ref 0.44–1.00)
GFR, Estimated: >60 mL/min (ref >60)
Glucose, Bld: 422 mg/dL — ABNORMAL HIGH (ref 70–99)
Potassium: 6 mmol/L — ABNORMAL HIGH (ref 3.5–5.1)
Sodium: 137 mmol/L (ref 135–145)

## 2022-02-15 LAB — CORTISOL: Cortisol, Plasma: 7.8 ug/dL

## 2022-02-15 LAB — LACTIC ACID, PLASMA: Lactic Acid, Venous: 2.6 mmol/L (ref 0.5–1.9)

## 2022-02-15 MED ORDER — COSYNTROPIN 0.25 MG IJ SOLR
0.2500 mg | Freq: Once | INTRAMUSCULAR | Status: AC
  Administered 2022-02-16: 0.25 mg via INTRAVENOUS
  Filled 2022-02-15 (×2): qty 0.25

## 2022-02-15 MED ORDER — INSULIN ASPART 100 UNIT/ML IJ SOLN
2.0000 [IU] | Freq: Three times a day (TID) | INTRAMUSCULAR | Status: DC
  Administered 2022-02-15 – 2022-02-27 (×33): 2 [IU] via SUBCUTANEOUS
  Filled 2022-02-15 (×30): qty 1

## 2022-02-15 MED ORDER — SODIUM ZIRCONIUM CYCLOSILICATE 10 G PO PACK
10.0000 g | PACK | Freq: Two times a day (BID) | ORAL | Status: DC
  Administered 2022-02-15 (×2): 10 g via ORAL
  Filled 2022-02-15 (×3): qty 1

## 2022-02-15 MED ORDER — INSULIN GLARGINE-YFGN 100 UNIT/ML ~~LOC~~ SOLN
11.0000 [IU] | Freq: Two times a day (BID) | SUBCUTANEOUS | Status: DC
  Administered 2022-02-15 – 2022-02-27 (×25): 11 [IU] via SUBCUTANEOUS
  Filled 2022-02-15 (×26): qty 0.11
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
## 2022-02-15 NOTE — Inpatient Diabetes Management (Signed)
Inpatient Diabetes Program Recommendations  AACE/ADA: New Consensus Statement on Inpatient Glycemic Control   Target Ranges:  Prepandial:   less than 140 mg/dL      Peak postprandial:   less than 180 mg/dL (1-2 hours)      Critically ill patients:  140 - 180 mg/dL    Latest Reference Range & Units 02/15/22 04:26  Glucose 70 - 99 mg/dL 422 (H)    Latest Reference Range & Units 02/14/22 08:15 02/14/22 12:17 02/14/22 16:48 02/14/22 21:23  Glucose-Capillary 70 - 99 mg/dL 264 (H) 96 183 (H) 328 (H)   Review of Glycemic Control  Diabetes history: DM1 (does NOT make any insulin; requires basal, correction, and carb coverage insulin) Outpatient Diabetes medications: Lantus 18 units daily, Novolog 3 units TID with meals, Metformin 1000 mg BID Current orders for Inpatient glycemic control: Semglee 10 units BID, Novolog 0-9 units TID with meals, Novolog 0-5 units QHS   Inpatient Diabetes Program Recommendations:     Insulin: Post prandial glucose elevated. Lab glucose 422 mg/dl today. Question if patient consumed carbohydrates during the night leading to noted hyperglycemia this morning.  Please consider increasing Semglee to 11 units BID and ordering Novolog 3 units TID with meals for meal coverage if patient eats at least 50% of meals.  Thanks, Barnie Alderman, RN, MSN, [place] Diabetes Coordinator Inpatient Diabetes Program [phone] (Team Pager from 8am to [place])

## 2022-02-15 NOTE — TOC Progression Note (Signed)
Transition of Care [facility]) - Progression Note    Patient Details  Name: Brittany [name] MRN: [id number] Date of Birth: 1960/06/16  Transition of Care [facility]) CM/SW Contact  Laurena Slimmer, RN Phone Number: 02/15/2022, 10:07 AM  Clinical Narrative:    No response received from [facility] regarding an assessment that was to be completed per their facility.   Emailed inquiry to [name] 1/3 requesting update. Waiting for response.        Expected Discharge Plan and Services                                               Social Determinants of Health (SDOH) Interventions SDOH Screenings   Food Insecurity: No Food Insecurity (12/30/2021)  Housing: Low Risk  (12/30/2021)  Transportation Needs: No Transportation Needs (12/30/2021)  Utilities: Not At Risk (12/30/2021)  Tobacco Use: High Risk (12/10/2021)    Readmission Risk Interventions    12/29/2021    1:48 PM 09/22/2021    1:24 PM 03/06/2021    9:13 AM  Readmission Risk Prevention Plan  Transportation Screening Complete Complete Complete  Medication Review Press photographer) Complete Complete Complete  PCP or Specialist appointment within 3-5 days of discharge  Complete Complete  HRI or Home Care Consult Complete Complete   SW Recovery Care/Counseling Consult Complete Complete   Palliative Care Screening Not Applicable Not Applicable Not [place] Not Applicable Complete Not Applicable
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
## 2022-02-15 NOTE — Progress Notes (Addendum)
Progress Note    Brittany [name]  ACZ:[id number] DOB: [birth year]  DOA: 02/09/2022 PCP: [name], NP      Brief Narrative:    Medical records reviewed and are as summarized below:  Brittany [name] is a 62 y.o. 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 difficile infection, who presented to the hospital with hypoglycemia and seizures.  Reportedly, glucose levels was under 40s and dropped into the 30s.  He was started on 10% dextrose infusion by EMS and brought to the emergency department.        Assessment/Plan:   Principal Problem:   Seizure ([facility]) Active Problems:   Hyperkalemia   Acute metabolic encephalopathy   Hypoglycemia   Hypothermia   Hypotension   Chronic diastolic CHF (congestive heart failure) (HCC)   Diabetes mellitus without complication (HCC)   History of stroke   HLD (hyperlipidemia)   Depression with anxiety   COPD (chronic obstructive pulmonary disease) (HCC)   Tobacco dependence   Elevated lactic acid level   HTN (hypertension)    Body mass index is 22.56 kg/m.   Seizures prior to admission: This was probably due to hypoglycemia.  No seizures on admission.  Continue Depakote.  IV Ativan as needed for seizure.  Type II DM with hyperglycemia, recent hypoglycemia, labile glucose levels: Increase insulin glargine from 10 units to 11 units twice daily.  Added scheduled NovoLog 2 units 3 times daily with meals.  Continue NovoLog as needed for hyperglycemia.  Hemoglobin A1c on 02/14/2022 was 11.  Hemoglobin A1c was 13.2 on 10/30/2021.   Recurrent hyperkalemia: Potassium went up from 5.6-6.0.  Continue Lokelma and repeat BMP tomorrow.  Continue low potassium diet.  Reluctant to use insulin for hyperkalemia because of high risk for  hypoglycemia.  Hypotension: Improved.  Cortisol level 7.8.  ACTH stimulating test has been ordered.  Acute metabolic encephalopathy, lethargy: Improved.  She is at baseline.  General weakness: Consult PT and OT  Hypothermia: Resolved  Other comorbidities include chronic diastolic CHF, depression, anxiety, hypertension, tobacco use disorder  Elevated lactic acid level: Chart review shows patient has had elevated lactic acid levels in the past  Diet Order             Diet heart healthy/carb modified Room service appropriate? Yes; Fluid consistency: Thin  Diet effective now                            Consultants: None  Procedures: None    Medications:    aspirin EC  81 mg Oral Daily   atorvastatin  40 mg Oral QHS   buPROPion  150 mg Oral Daily   [START ON 02/16/2022] cosyntropin  0.25 mg Intravenous Once   diltiazem  120 mg Oral Daily   divalproex  500 mg Oral BID   enoxaparin (LOVENOX) injection  40 mg Subcutaneous Q24H   famotidine  20 mg Oral Daily   feeding supplement (GLUCERNA SHAKE)  237 mL Oral TID BM   insulin aspart  0-5 Units Subcutaneous QHS   insulin aspart  0-9 Units Subcutaneous TID WC   insulin aspart  2 Units Subcutaneous TID WC   insulin glargine-yfgn  11 Units Subcutaneous BID   melatonin  2.5 mg Oral QHS   mirtazapine  15 mg Oral QHS  multivitamin with minerals  1 tablet Oral Daily   nicotine  21 mg Transdermal Daily   QUEtiapine  50 mg Oral QHS   rOPINIRole  0.5 mg Oral QHS   sodium zirconium cyclosilicate  10 g Oral BID   Continuous Infusions:   Anti-infectives (From admission, onward)    Start     Dose/Rate Route Frequency Ordered Stop   02/09/22 1345  piperacillin-tazobactam (ZOSYN) IVPB 3.375 g        3.375 g 100 mL/hr over 30 Minutes Intravenous  Once 02/09/22 1336 02/09/22 1518              Family Communication/Anticipated D/C date and plan/Code Status   DVT prophylaxis: enoxaparin (LOVENOX) injection 40 mg  Start: 02/09/22 1600     Code Status: Prior  Family Communication: None Disposition Plan: Plan to discharge home in 1 to 2 days  Status is: Inpatient Remains inpatient appropriate because: Hyperkalemia         Subjective:   Interval events noted.  She complains of generalized weakness and said "I just do not feel good".  S[place], RN, was at the bedside  Objective:    Vitals:   02/15/22 0000 02/15/22 0323 02/15/22 0849 02/15/22 1157  BP: 99/74 105/78 130/82 116/87  Pulse: 93 88 88 98  Resp: '16 16 20 18  '$ Temp: 98.9 F (37.2 C) 98.7 F (37.1 C) 98.2 F (36.8 C) 97.8 F (36.6 C)  TempSrc:    Oral  SpO2: 98% 99% 96% (!) 80%  Weight:      Height:       No data found.   Intake/Output Summary (Last 24 hours) at 02/15/2022 1514 Last data filed at 02/15/2022 1400 Gross per 24 hour  Intake 977.11 ml  Output 1450 ml  Net -472.89 ml   Filed Weights   02/09/22 1345 02/13/22 0324  Weight: 54.9 kg 52.4 kg    Exam:  GEN: NAD SKIN: No rash EYES: EOMI ENT: MMM CV: RRR PULM: CTA B ABD: soft, ND, NT, +BS CNS: AAO x 3, non focal EXT: No edema or tenderness    Data Reviewed:   I have personally reviewed following labs and imaging studies:  Labs: Labs show the following:   Basic Metabolic Panel: Recent Labs  Lab 02/09/22 1331 02/10/22 0750 02/10/22 1403 02/14/22 0922 02/15/22 0426  NA 141 139  --  139 137  K 4.5 6.4*   < > 5.6* 6.0*  CL 115* 115*  --  109 108  CO2 21* 21*  --  24 23  GLUCOSE 164* 265*  --  196* 422*  BUN 14 10  --  20 25*  CREATININE 0.65 0.87  --  0.92 1.00  CALCIUM 8.6* 8.3*  --  8.6* 8.5*   < > = values in this interval not displayed.   GFR Estimated Creatinine Clearance: 41.9 mL/min (by C-G formula based on SCr of 1 mg/dL). Liver Function Tests: No results for input(s): "AST", "ALT", "ALKPHOS", "BILITOT", "PROT", "ALBUMIN" in the last 168 hours. No results for input(s): "LIPASE", "AMYLASE" in the last 168 hours. No results  for input(s): "AMMONIA" in the last 168 hours. Coagulation profile Recent Labs  Lab 02/09/22 1331  INR 1.2    CBC: Recent Labs  Lab 02/09/22 1331 02/10/22 0750 02/15/22 0426  WBC 6.4 5.6 7.4  NEUTROABS  --   --  2.4  HGB 10.0* 10.5* 10.7*  HCT 31.8* 33.0* 33.7*  MCV 88.6 87.8 88.0  PLT 213 217  281   Cardiac Enzymes: No results for input(s): "CKTOTAL", "CKMB", "CKMBINDEX", "TROPONINI" in the last 168 hours. BNP (last 3 results) No results for input(s): "PROBNP" in the last 8760 hours. CBG: Recent Labs  Lab 02/14/22 1217 02/14/22 1648 02/14/22 2123 02/15/22 0845 02/15/22 1155  GLUCAP 96 183* 328* 344* 270*   D-Dimer: No results for input(s): "DDIMER" in the last 72 hours. Hgb A1c: Recent Labs    02/14/22 0922  HGBA1C 11.0*   Lipid Profile: No results for input(s): "CHOL", "HDL", "LDLCALC", "TRIG", "CHOLHDL", "LDLDIRECT" in the last 72 hours. Thyroid function studies: No results for input(s): "TSH", "T4TOTAL", "T3FREE", "THYROIDAB" in the last 72 hours.  Invalid input(s): "FREET3" Anemia work up: No results for input(s): "VITAMINB12", "FOLATE", "FERRITIN", "TIBC", "IRON", "RETICCTPCT" in the last 72 hours. Sepsis Labs: Recent Labs  Lab 02/09/22 1331 02/09/22 1523 02/09/22 1530 02/09/22 1903 02/10/22 0750 02/15/22 0426  PROCALCITON  --  <0.10  --   --   --   --   WBC 6.4  --   --   --  5.6 7.4  LATICACIDVEN 2.3*  --  4.2* 3.7* 2.1* 2.6*    Microbiology Recent Results (from the past 240 hour(s))  Urine Culture     Status: None   Collection Time: 02/09/22  1:27 PM   Specimen: In/Out Cath Urine  Result Value Ref Range Status   Specimen Description   Final    IN/OUT CATH URINE Performed at [facility], [address], [place], [place] [address]    Special Requests   Final    NONE Performed at [facility], [address] [place], [place] [address]    Culture   Final    NO GROWTH Performed at [facility], [place] [address] [place], [place] [address]    Report Status 02/10/2022 FINAL  Final  Blood Culture (routine x 2)     Status: None   Collection Time: 02/09/22  1:31 PM   Specimen: BLOOD  Result Value Ref Range Status   Specimen Description BLOOD LEFT WRIST  Final   Special Requests   Final    BOTTLES DRAWN AEROBIC AND ANAEROBIC Blood Culture adequate volume   Culture   Final    NO GROWTH 5 DAYS Performed at [facility], [address] [place], [place] [address]    Report Status 02/14/2022 FINAL  Final  Resp Panel by RT-PCR (Flu A&B, Covid) Anterior Nasal Swab     Status: None   Collection Time: 02/09/22  1:31 PM   Specimen: Anterior Nasal Swab  Result Value Ref Range Status   SARS Coronavirus 2 by RT PCR NEGATIVE NEGATIVE Final    Comment: (NOTE) SARS-CoV-2 target nucleic acids are NOT DETECTED.  The SARS-CoV-2 RNA is generally detectable in upper respiratory specimens during the acute phase of infection. The lowest concentration of SARS-CoV-2 viral copies this assay can detect is 138 copies/mL. A negative result does not preclude SARS-Cov-2 infection and should not be used as the sole basis for treatment or other patient management decisions. A negative result may occur with  improper specimen collection/handling, submission of specimen other than nasopharyngeal swab, presence of viral mutation(s) within the areas targeted by this assay, and inadequate number of viral copies(<138 copies/mL). A negative result must be combined with clinical observations, patient history, and epidemiological information. The expected result is Negative.  Fact Sheet for Patients:  EntrepreneurPulse.com.au  Fact Sheet for Healthcare Providers:  IncredibleEmployment.be  This test is  no t yet approved or cleared by the [place] and  has been authorized for detection and/or diagnosis of SARS-CoV-2 by FDA under an Emergency Use Authorization (EUA).  This EUA will remain  in effect (meaning this test can be used) for the duration of the COVID-19 declaration under Section 564(b)(1) of the Act, 21 U.S.C.section 360bbb-3(b)(1), unless the authorization is terminated  or revoked sooner.       Influenza A by PCR NEGATIVE NEGATIVE Final   Influenza B by PCR NEGATIVE NEGATIVE Final    Comment: (NOTE) The Xpert Xpress SARS-CoV-2/FLU/RSV plus assay is intended as an aid in the diagnosis of influenza from Nasopharyngeal swab specimens and should not be used as a sole basis for treatment. Nasal washings and aspirates are unacceptable for Xpert Xpress SARS-CoV-2/FLU/RSV testing.  Fact Sheet for Patients: EntrepreneurPulse.com.au  Fact Sheet for Healthcare Providers: IncredibleEmployment.be  This test is not yet approved or cleared by the [place] FDA and has been authorized for detection and/or diagnosis of SARS-CoV-2 by FDA under an Emergency Use Authorization (EUA). This EUA will remain in effect (meaning this test can be used) for the duration of the COVID-19 declaration under Section 564(b)(1) of the Act, 21 U.S.C. section 360bbb-3(b)(1), unless the authorization is terminated or revoked.  Performed at [facility], [address] [place], [place] [address]   Blood Culture (routine x 2)     Status: None   Collection Time: 02/09/22  3:30 PM   Specimen: BLOOD RIGHT HAND  Result Value Ref Range Status   Specimen Description BLOOD RIGHT HAND  Final   Special Requests   Final    BOTTLES DRAWN AEROBIC AND ANAEROBIC Blood Culture results may not be optimal due to an inadequate volume of blood received in culture bottles   Culture   Final    NO GROWTH 5 DAYS Performed at [facility], [address], [place], [place] [address]    Report Status 02/14/2022 FINAL  Final    Procedures and diagnostic studies:  No results found.             LOS: 0 days    Izsak Meir  [facility]   Pager on [email or web address]. If 7PM-7AM, please contact night-coverage at [email or web address]     02/15/2022, 3:14 PM

## 2022-02-15 NOTE — TOC Progression Note (Signed)
Transition of Care [facility]) - Progression Note    Patient Details  Name: Brittany [name] MRN: [id number] Date of Birth: 05-03-1960  Transition of [facility]) CM/SW Contact  Laurena Slimmer, RN Phone Number: 02/15/2022, 3:50 PM  Clinical Narrative:    FL2 completed.  Per email correspondence received from Dorena Cookey is point of contact for patient. Contacted facility.  Spoke with [facility]. [facility] was not onsite. Colletta [place] provided fax number to send FL2.   Meet with [facility] onsite at [facility]. Assessment was completed. Patient is approved to return to facility. FL2 provided to [facility] on site. MD notified of acceptance tomorrow. Will plan for midmorning discharge pending medical clearance. MD notified.         Expected Discharge Plan and Services                                               Social Determinants of Health (SDOH) Interventions SDOH Screenings   Food Insecurity: No Food Insecurity (12/30/2021)  Housing: Low Risk  (12/30/2021)  Transportation Needs: No Transportation Needs (12/30/2021)  Utilities: Not At Risk (12/30/2021)  Tobacco Use: High Risk (12/10/2021)    Readmission Risk Interventions    12/29/2021    1:48 PM 09/22/2021    1:24 PM 03/06/2021    9:13 AM  Readmission Risk Prevention Plan  Transportation Screening Complete Complete Complete  Medication Review Press photographer) Complete Complete Complete  PCP or Specialist appointment within 3-5 days of discharge  Complete Complete  HRI or Home Care Consult Complete Complete   SW Recovery Care/Counseling Consult Complete Complete   Palliative Care Screening Not Applicable Not Applicable Not [place] Not Applicable Complete Not Applicable

## 2022-02-16 DIAGNOSIS — G9341 Metabolic encephalopathy: Secondary | ICD-10-CM | POA: Diagnosis not present

## 2022-02-16 DIAGNOSIS — R569 Unspecified convulsions: Secondary | ICD-10-CM | POA: Diagnosis not present

## 2022-02-16 DIAGNOSIS — E875 Hyperkalemia: Secondary | ICD-10-CM | POA: Diagnosis not present

## 2022-02-16 LAB — BASIC METABOLIC PANEL
Anion gap: 8 (ref 5–15)
BUN: 24 mg/dL — ABNORMAL HIGH (ref 8–23)
CO2: 21 mmol/L — ABNORMAL LOW (ref 22–32)
Calcium: 8.2 mg/dL — ABNORMAL LOW (ref 8.9–10.3)
Chloride: 105 mmol/L (ref 98–111)
Creatinine, Ser: 1.04 mg/dL — ABNORMAL HIGH (ref 0.44–1.00)
GFR, Estimated: >60 mL/min (ref >60)
Glucose, Bld: 505 mg/dL (ref 70–99)
Potassium: 6.8 mmol/L (ref 3.5–5.1)
Sodium: 134 mmol/L — ABNORMAL LOW (ref 135–145)

## 2022-02-16 LAB — ACTH STIMULATION, 3 TIME POINTS
Cortisol, 30 Min: 25.2 ug/dL
Cortisol, 60 Min: 27.7 ug/dL
Cortisol, Base: 3 ug/dL

## 2022-02-16 LAB — GLUCOSE, CAPILLARY
Glucose-Capillary: 408 mg/dL — ABNORMAL HIGH (ref 70–99)
Glucose-Capillary: 178 mg/dL — ABNORMAL HIGH (ref 70–99)
Glucose-Capillary: 76 mg/dL (ref 70–99)
Glucose-Capillary: 88 mg/dL (ref 70–99)
Glucose-Capillary: 118 mg/dL — ABNORMAL HIGH (ref 70–99)
Glucose-Capillary: 97 mg/dL (ref 70–99)
Glucose-Capillary: 297 mg/dL — ABNORMAL HIGH (ref 70–99)
Glucose-Capillary: 232 mg/dL — ABNORMAL HIGH (ref 70–99)

## 2022-02-16 LAB — POTASSIUM: Potassium: 6 mmol/L — ABNORMAL HIGH (ref 3.5–5.1)

## 2022-02-16 MED ORDER — ALBUTEROL SULFATE (2.5 MG/3ML) 0.083% IN NEBU
10.0000 mg | INHALATION_SOLUTION | Freq: Once | RESPIRATORY_TRACT | Status: AC
  Administered 2022-02-16: 10 mg via RESPIRATORY_TRACT
  Filled 2022-02-16: qty 12

## 2022-02-16 MED ORDER — SODIUM CHLORIDE 0.9 % IV BOLUS
500.0000 mL | Freq: Once | INTRAVENOUS | Status: AC
  Administered 2022-02-16: 500 mL via INTRAVENOUS

## 2022-02-16 MED ORDER — CALCIUM GLUCONATE-NACL 1-0.675 GM/50ML-% IV SOLN
1.0000 g | Freq: Once | INTRAVENOUS | Status: AC
  Administered 2022-02-16: 1000 mg via INTRAVENOUS
  Filled 2022-02-16: qty 50

## 2022-02-16 MED ORDER — INSULIN ASPART 100 UNIT/ML IJ SOLN
20.0000 [IU] | Freq: Once | INTRAMUSCULAR | Status: AC
  Administered 2022-02-16: 20 [IU] via SUBCUTANEOUS
  Filled 2022-02-16: qty 1

## 2022-02-16 MED ORDER — MIDODRINE HCL 5 MG PO TABS
5.0000 mg | ORAL_TABLET | Freq: Three times a day (TID) | ORAL | Status: DC
  Administered 2022-02-16 – 2022-02-17 (×3): 5 mg via ORAL
  Filled 2022-02-16 (×3): qty 1

## 2022-02-16 MED ORDER — SODIUM ZIRCONIUM CYCLOSILICATE 10 G PO PACK
10.0000 g | PACK | Freq: Once | ORAL | Status: AC
  Administered 2022-02-16: 10 g via ORAL
  Filled 2022-02-16: qty 1

## 2022-02-16 MED ORDER — PATIROMER SORBITEX CALCIUM 8.4 G PO PACK
16.8000 g | PACK | Freq: Every day | ORAL | Status: DC
  Administered 2022-02-16 – 2022-02-19 (×4): 16.8 g via ORAL
  Filled 2022-02-16 (×4): qty 2

## 2022-02-16 MED ORDER — SODIUM CHLORIDE 0.9 % IV SOLN: INTRAVENOUS | Status: DC

## 2022-02-16 NOTE — Inpatient Diabetes Management (Signed)
Inpatient Diabetes Program Recommendations  AACE/ADA: New Consensus Statement on Inpatient Glycemic Control   Target Ranges:  Prepandial:   less than 140 mg/dL      Peak postprandial:   less than 180 mg/dL (1-2 hours)      Critically ill patients:  140 - 180 mg/dL    Latest Reference Range & Units 02/16/22 06:45 02/16/22 08:08 02/16/22 08:52 02/16/22 09:22  Glucose-Capillary 70 - 99 mg/dL 408 (H)  Novolog 20 units '@6'$ :16 178 (H) 76 88    Latest Reference Range & Units 02/15/22 08:45 02/15/22 11:55 02/15/22 15:53 02/15/22 19:51  Glucose-Capillary 70 - 99 mg/dL 344 (H)  Novolog 9 units  Semglee 11 units 270 (H)  Novolog 7 units 190 (H)  Novolog 4 units 164 (H)     Semglee 11 units   Review of Glycemic Control  Diabetes history: DM1 (does NOT make any insulin; requires basal, correction, and carb coverage insulin) Outpatient Diabetes medications: Lantus 18 units daily, Novolog 3 units TID with meals, Metformin 1000 mg BID Current orders for Inpatient glycemic control: Semglee 11 units BID, Novolog 0-9 units TID with meals, Novolog 0-5 units QHS, Novolog 2 units TID with meals  Inpatient Diabetes Program Recommendations:    Insulin: Noted glucose 408 mg/dl today and Novolog 20 units given at 6:16 am today. Glucose down to 76 mg/dl at 8:52 and patient given PO carbohydrates. Anticipate Novolog 20 units was too much short acting insulin for patient given she has Type 1 DM and is sensitive to insulin. Please consider changing Semlgee to 11  units QAM, Semglee 13 units QHS and increasing meal coverage to Novolog 4 units TID with meals if patient eats at least 50% of meals.  Snacks: If patient is consuming carbohydrates during the night, please consider ordering Novolog 2 units as needed for snack of at least 20 grams of carbs.  Thanks, Barnie Alderman, RN, MSN, [place] Diabetes Coordinator Inpatient Diabetes Program [phone] (Team Pager from 8am to [place])

## 2022-02-16 NOTE — Consult Note (Signed)
[facility]  CONSULT NOTE    Date: 02/16/2022                  Patient Name:  Brittany [name]  MRN: [id number]  DOB: 10-03-1960  Age / Sex: 62 y.o., female         PCP: [name], NP                 Service Requesting Consult: [place]                 Reason for Consult: Refractory hyperkalemia            History of Present Illness: [name] is a 62 y.o.  female with past medical history of COPD, diabetes, hypertension, lupus, pancreatic cancer, seizures, RLS, who was admitted to [facility] on 02/09/2022 for Seizure ([place]) [R56.9] Hypoglycemia [E16.2] SIRS (systemic inflammatory response syndrome) ([place]) [R65.10] Hyperkalemia [E87.5]  Patient initially presented to the emergency department from her nursing facility after experiencing a seizure.  Patient initially in postictal stage on ED arrival however mentation improved rapidly in ED.  During his hospitalization, seizures believed to be induced by hypoglycemia.  Elevated potassium levels managed with IV medications and Lokelma.  Since admission potassium labs have ranged from 4.5-6.8.  Currently 6.8.  Glucose levels have fluctuated also with the levels as high as 463.   Medications: Outpatient medications: Medications Prior to Admission  Medication Sig Dispense Refill Last Dose   aspirin 81 MG EC tablet Take 1 tablet (81 mg total) by mouth daily. Swallow whole. 30 tablet 11 02/09/2022 at 0846   atorvastatin (LIPITOR) 40 MG tablet Take 1 tablet (40 mg total) by mouth Nightly. (Patient taking differently: Take 40 mg by mouth at bedtime.) 30 tablet 1 02/08/2022 at 1849   buPROPion (WELLBUTRIN XL) 150 MG 24 hr tablet Take 150 mg by mouth daily.   02/09/2022 at 0846   clonazePAM (KLONOPIN) 0.5 MG tablet Take 1 tablet (0.5 mg total) by mouth 2 (two) times daily as needed for anxiety. 10 tablet 0 prn   Dextrose, Diabetic Use, ([facility] GLUCOSE BITS) 1 g CHEW Chew 1 tablet by mouth daily as needed  (hypoglycemia).   prn   diltiazem (CARDIZEM CD) 120 MG 24 hr capsule Take 1 capsule (120 mg total) by mouth daily.   02/09/2022 at 0846   divalproex (DEPAKOTE) 500 MG DR tablet Take 1 tablet (500 mg total) by mouth 2 (two) times daily. 30 tablet 1 02/09/2022 at 0846   famotidine (PEPCID) 20 MG tablet Take 1 tablet (20 mg total) by mouth daily. 30 tablet 0 02/09/2022 at 0846   insulin aspart (NOVOLOG) 100 UNIT/ML injection Inject 5 Units into the skin 3 (three) times daily with meals. If eats more than 5)% of meals (Patient taking differently: Inject 3 Units into the skin 3 (three) times daily with meals. Hold for blood sugar less than 120) 10 mL 11 02/09/2022 at 1205   LANTUS SOLOSTAR 100 UNIT/ML Solostar Pen Inject 18 Units into the skin daily.   02/09/2022 at 0846   loratadine (CLARITIN) 10 MG tablet Take 10 mg by mouth daily as needed for allergies.   prn   melatonin 5 MG TABS Take 0.5 tablets (2.5 mg total) by mouth at bedtime.  0 02/08/2022 at 1849   metFORMIN (GLUCOPHAGE) 1000 MG tablet Take 1,000 mg by mouth 2 (two) times daily.   02/09/2022 at 0846   mirtazapine (REMERON) 15 MG tablet Take  15 mg by mouth at bedtime.   02/08/2022 at 1849   Multiple Vitamin (MULTIVITAMIN WITH MINERALS) TABS tablet Take 1 tablet by mouth daily. 30 tablet 0 02/09/2022 at 0846   nicotine (NICODERM CQ - DOSED IN MG/24 HOURS) 21 mg/24hr patch Place 1 patch (21 mg total) onto the skin daily as needed (nicotine craving). 28 patch 0 prn   QUEtiapine (SEROQUEL XR) 50 MG TB24 24 hr tablet Take 50 mg by mouth at bedtime.   02/08/2022 at 1849   rOPINIRole (REQUIP) 0.5 MG tablet Take 0.5 mg by mouth at bedtime.   02/08/2022 at 1849   SPS 15 GM/60ML suspension Take 15 g by mouth every other day.   02/08/2022 at 0725   zinc sulfate 220 (50 Zn) MG capsule Take 1 capsule (220 mg total) by mouth daily.   02/09/2022 at 0846   feeding supplement, GLUCERNA SHAKE, (GLUCERNA SHAKE) LIQD Take 237 mLs by mouth 3 (three) times daily  between meals. 30 mL 0    insulin detemir (LEVEMIR) 100 UNIT/ML injection Inject 0.08 mLs (8 Units total) into the skin 2 (two) times daily. (Patient taking differently: Inject 10 Units into the skin 2 (two) times daily.) 10 mL 11 02/07/2022 at 0857    Current medications: Current Facility-Administered Medications  Medication Dose Route Frequency Provider Last Rate Last Admin   0.9 %  sodium chloride infusion   Intravenous Continuous Colon Flattery, NP 75 mL/hr at 02/16/22 1142 Infusion Verify at 02/16/22 1142   acetaminophen (TYLENOL) tablet 650 mg  650 mg Oral Q6H PRN Ivor Costa, MD   650 mg at 02/15/22 2137   albuterol (PROVENTIL) (2.5 MG/3ML) 0.083% nebulizer solution 2.5 mg  2.5 mg Nebulization Q4H PRN Ivor Costa, MD       aspirin EC tablet 81 mg  81 mg Oral Daily Ivor Costa, MD   81 mg at 02/16/22 1013   atorvastatin (LIPITOR) tablet 40 mg  40 mg Oral QHS Ivor Costa, MD   40 mg at 02/15/22 2137   buPROPion (WELLBUTRIN XL) 24 hr tablet 150 mg  150 mg Oral Daily Ivor Costa, MD   150 mg at 02/16/22 1013   dextrose 50 % solution 50 mL  50 mL Intravenous PRN Ivor Costa, MD       diltiazem (CARDIZEM CD) 24 hr capsule 120 mg  120 mg Oral Daily Fritzi Mandes, MD   120 mg at 02/15/22 0932   divalproex (DEPAKOTE) DR tablet 500 mg  500 mg Oral BID Fritzi Mandes, MD   500 mg at 02/16/22 1017   enoxaparin (LOVENOX) injection 40 mg  40 mg Subcutaneous Q24H Ivor Costa, MD   40 mg at 02/15/22 1634   famotidine (PEPCID) tablet 20 mg  20 mg Oral Daily Ivor Costa, MD   20 mg at 02/16/22 1013   feeding supplement (GLUCERNA SHAKE) (GLUCERNA SHAKE) liquid 237 mL  237 mL Oral TID BM Ivor Costa, MD   237 mL at 02/16/22 1327   insulin aspart (novoLOG) injection 0-5 Units  0-5 Units Subcutaneous QHS Ivor Costa, MD   0 Units at 02/15/22 2139   insulin aspart (novoLOG) injection 0-9 Units  0-9 Units Subcutaneous TID WC Ivor Costa, MD   2 Units at 02/15/22 1634   insulin aspart (novoLOG) injection 2 Units  2 Units  Subcutaneous TID WC Jennye Boroughs, MD   2 Units at 02/16/22 1331   insulin glargine-yfgn (SEMGLEE) injection 11 Units  11 Units Subcutaneous BID Jennye Boroughs, MD  11 Units at 02/16/22 1025   loratadine (CLARITIN) tablet 10 mg  10 mg Oral Daily PRN Ivor Costa, MD       LORazepam (ATIVAN) injection 1 mg  1 mg Intramuscular Q2H PRN Fritzi Mandes, MD   1 mg at 02/15/22 0004   melatonin tablet 2.5 mg  2.5 mg Oral QHS Ivor Costa, MD   2.5 mg at 02/15/22 2137   mirtazapine (REMERON) tablet 15 mg  15 mg Oral QHS Ivor Costa, MD   15 mg at 02/15/22 2137   multivitamin with minerals tablet 1 tablet  1 tablet Oral Daily Ivor Costa, MD   1 tablet at 02/16/22 1013   nicotine (NICODERM CQ - dosed in mg/24 hours) patch 21 mg  21 mg Transdermal Daily Ivor Costa, MD   21 mg at 02/16/22 1011   ondansetron (ZOFRAN) injection 4 mg  4 mg Intravenous Q8H PRN Ivor Costa, MD       patiromer Daryll Drown) packet 16.8 g  16.8 g Oral Daily Jennye Boroughs, MD   16.8 g at 02/16/22 0919   QUEtiapine (SEROQUEL XR) 24 hr tablet 50 mg  50 mg Oral QHS Ivor Costa, MD   50 mg at 02/15/22 2135   rOPINIRole (REQUIP) tablet 0.5 mg  0.5 mg Oral QHS Ivor Costa, MD   0.5 mg at 02/15/22 2136      Allergies: Allergies  Allergen Reactions   Cephalosporins Itching    TOLERATED ZOSYN (PIPERACILLIN) BEFORE   Hydromorphone Itching   Keflin [Cephalothin] Itching   Lactose Intolerance (Gi) Diarrhea      Past Medical History: Past Medical History:  Diagnosis Date   C. difficile colitis 02/17/2021   Colostomy in place [name]    h/o diverticulitis with abscess   Complicated grief 3/53/6144   COPD, mild ([place]) 09/16/2015   PFTs on 06/15/15 with FEV1/FVC ratio of 64%, FEV1 83%, DLCO 47%   Diabetes mellitus without complication (HCC)    Dyslipidemia    History of Clostridium difficile colitis 03/03/2021   Hyperosmolar hyperglycemic state (HHS) ([place]) 11/18/2020   Hypertension    Hypokalemia; hyperkalemia 07/12/2019   Lactic acidosis 11/19/2020    Lupus (HCC)    Pancreatic cancer (HCC)    RLS (restless legs syndrome)    Seizure disorder (HCC)    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[place]) 07/17/2019     Past Surgical History: Past Surgical History:  Procedure Laterality Date   COLECTOMY WITH COLOSTOMY CREATION/HARTMANN PROCEDURE N/A 03/10/2021   Procedure: COLECTOMY WITH COLOSTOMY CREATION/HARTMANN PROCEDURE;  Surgeon: Jules Husbands, MD;  Location: [facility] ORS;  Service: General;  Laterality: N/A;   PANCREATECTOMY     spleenectomy       Family History: Family History  Problem Relation Age of Onset   Anxiety disorder Sister    Breast cancer Maternal Aunt      Social History: Social History   Socioeconomic History   Marital status: Widowed    Spouse name: Not on file   Number of children: Not on file   Years of education: Not on file   Highest education level: Not on file  Occupational History   Not on file  Tobacco Use   Smoking status: Every Day    Types: Cigarettes    Passive exposure: Past   Smokeless tobacco: Never  Substance and Sexual Activity   Alcohol use: Not Currently   Drug use: Not Currently    Types: Marijuana   Sexual activity: Not Currently  Other  Topics Concern   Not on file  Social History Narrative   Not on file   Social Determinants of Health   Financial Resource Strain: Not on file  Food Insecurity: No Food Insecurity (12/30/2021)   Hunger Vital Sign    Worried About Running Out of Food in the Last Year: Never true    Ran Out of Food in the Last Year: Never true  Transportation Needs: No Transportation Needs (12/30/2021)   PRAPARE - Hydrologist (Medical): No    Lack of Transportation (Non-Medical): No  Physical Activity: Not on file  Stress: Not on file  Social Connections: Not on file  Intimate Partner Violence: Not At Risk (12/30/2021)   Humiliation, Afraid, Rape, and Kick questionnaire    Fear of Current or Ex-Partner: No     Emotionally Abused: No    Physically Abused: No    Sexually Abused: No     Review of Systems: ROS  Vital Signs: Blood pressure 113/79, pulse 86, temperature 97.6 F (36.4 C), temperature source Oral, resp. rate 11, height 5' (1.524 m), weight 52.4 kg, SpO2 99 %.  Weight trends: Filed Weights   02/09/22 1345 02/13/22 0324  Weight: 54.9 kg 52.4 kg    Physical Exam: General: NAD, resting quietly  Head: Normocephalic, atraumatic. Moist oral mucosal membranes  Eyes: Anicteric  Lungs:  Clear to auscultation, normal effort, room air  Heart: Regular rate and rhythm  Abdomen:  Soft, nontender, nondistended  Extremities: No peripheral edema.  Neurologic: Nonfocal, moving all four extremities  Skin: No lesions  Access: none      Lab results: Basic Metabolic Panel: Recent Labs  Lab 02/14/22 0922 02/15/22 0426 02/16/22 0443  NA 139 137 134*  K 5.6* 6.0* 6.8*  CL 109 108 105  CO2 24 23 21*  GLUCOSE 196* 422* 505*  BUN 20 25* 24*  CREATININE 0.92 1.00 1.04*  CALCIUM 8.6* 8.5* 8.2*    Liver Function Tests: No results for input(s): "AST", "ALT", "ALKPHOS", "BILITOT", "PROT", "ALBUMIN" in the last 168 hours. No results for input(s): "LIPASE", "AMYLASE" in the last 168 hours. No results for input(s): "AMMONIA" in the last 168 hours.  CBC: Recent Labs  Lab 02/10/22 0750 02/15/22 0426  WBC 5.6 7.4  NEUTROABS  --  2.4  HGB 10.5* 10.7*  HCT 33.0* 33.7*  MCV 87.8 88.0  PLT 217 281    Cardiac Enzymes: No results for input(s): "CKTOTAL", "CKMB", "CKMBINDEX", "TROPONINI" in the last 168 hours.  BNP: Invalid input(s): "POCBNP"  CBG: Recent Labs  Lab 02/16/22 0808 02/16/22 0852 02/16/22 0922 02/16/22 1018 02/16/22 1139  GLUCAP 178* 76 88 118* 67    Microbiology: Results for orders placed or performed during the hospital encounter of 02/09/22  Urine Culture     Status: None   Collection Time: 02/09/22  1:27 PM   Specimen: In/Out Cath Urine  Result Value Ref  Range Status   Specimen Description   Final    IN/OUT CATH URINE Performed at [facility], [address] [place], [place] [address]    Special Requests   Final    NONE Performed at [facility], [address] [place], [place] [address]    Culture   Final    NO GROWTH Performed at [facility], [place] [address] [place], [place] [address]    Report Status 02/10/2022 FINAL  Final  Blood Culture (routine x 2)     Status: None  Collection Time: 02/09/22  1:31 PM   Specimen: BLOOD  Result Value Ref Range Status   Specimen Description BLOOD LEFT WRIST  Final   Special Requests   Final    BOTTLES DRAWN AEROBIC AND ANAEROBIC Blood Culture adequate volume   Culture   Final    NO GROWTH 5 DAYS Performed at [facility], [address] [place], [place] [address]    Report Status 02/14/2022 FINAL  Final  Resp Panel by RT-PCR (Flu A&B, Covid) Anterior Nasal Swab     Status: None   Collection Time: 02/09/22  1:31 PM   Specimen: Anterior Nasal Swab  Result Value Ref Range Status   SARS Coronavirus 2 by RT PCR NEGATIVE NEGATIVE Final    Comment: (NOTE) SARS-CoV-2 target nucleic acids are NOT DETECTED.  The SARS-CoV-2 RNA is generally detectable in upper respiratory specimens during the acute phase of infection. The lowest concentration of SARS-CoV-2 viral copies this assay can detect is 138 copies/mL. A negative result does not preclude SARS-Cov-2 infection and should not be used as the sole basis for treatment or other patient management decisions. A negative result may occur with  improper specimen collection/handling, submission of specimen other than nasopharyngeal swab, presence of viral mutation(s) within the areas targeted by this assay, and inadequate number of viral copies(<138 copies/mL). A negative result must be combined with clinical observations, patient history, and epidemiological information. The expected result is  Negative.  Fact Sheet for Patients:  EntrepreneurPulse.com.au  Fact Sheet for Healthcare Providers:  IncredibleEmployment.be  This test is no t yet approved or cleared by the [place] FDA and  has been authorized for detection and/or diagnosis of SARS-CoV-2 by FDA under an Emergency Use Authorization (EUA). This EUA will remain  in effect (meaning this test can be used) for the duration of the COVID-19 declaration under Section 564(b)(1) of the Act, 21 U.S.C.section 360bbb-3(b)(1), unless the authorization is terminated  or revoked sooner.       Influenza A by PCR NEGATIVE NEGATIVE Final   Influenza B by PCR NEGATIVE NEGATIVE Final    Comment: (NOTE) The Xpert Xpress SARS-CoV-2/FLU/RSV plus assay is intended as an aid in the diagnosis of influenza from Nasopharyngeal swab specimens and should not be used as a sole basis for treatment. Nasal washings and aspirates are unacceptable for Xpert Xpress SARS-CoV-2/FLU/RSV testing.  Fact Sheet for Patients: EntrepreneurPulse.com.au  Fact Sheet for Healthcare Providers: IncredibleEmployment.be  This test is not yet approved or cleared by the [place] FDA and has been authorized for detection and/or diagnosis of SARS-CoV-2 by FDA under an Emergency Use Authorization (EUA). This EUA will remain in effect (meaning this test can be used) for the duration of the COVID-19 declaration under Section 564(b)(1) of the Act, 21 U.S.C. section 360bbb-3(b)(1), unless the authorization is terminated or revoked.  Performed at [facility], [address] [place], [place] [address]   Blood Culture (routine x 2)     Status: None   Collection Time: 02/09/22  3:30 PM   Specimen: BLOOD RIGHT HAND  Result Value Ref Range Status   Specimen Description BLOOD RIGHT HAND  Final   Special Requests   Final    BOTTLES DRAWN AEROBIC AND ANAEROBIC Blood Culture  results may not be optimal due to an inadequate volume of blood received in culture bottles   Culture   Final    NO GROWTH 5 DAYS Performed at [facility], [address], [place], [place] [address]  Report Status 02/14/2022 FINAL  Final    Coagulation Studies: No results for input(s): "LABPROT", "INR" in the last 72 hours.  Urinalysis: No results for input(s): "COLORURINE", "LABSPEC", "PHURINE", "GLUCOSEU", "HGBUR", "BILIRUBINUR", "KETONESUR", "PROTEINUR", "UROBILINOGEN", "NITRITE", "LEUKOCYTESUR" in the last 72 hours.  Invalid input(s): "APPERANCEUR"    Imaging: No results found.   Assessment & Plan: Ms. ETOSHA WETHERELL is a 62 y.o.  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 Potassium on admission 4.5. Elevated to 6.4. Received IV bicarb and insulin. Patient has received multiple rounds of IV and oral intervention with persistent hyperkaliemia. Continue Veltassa and one time dose of Lokelma ordered.   2. Diabetes Mellitus, type 2. Hgb A1c 11.0 on 02/14/22. Not well controlled. Insulin dependent.  Sliding scale insulin ordered by primary team.   3.  Hypotension Receiving diltiazem and midodrine.  Blood pressure slightly, 113/79.  LOS: 1   1/5/20243:21 PM

## 2022-02-16 NOTE — Progress Notes (Signed)
PT Cancellation Note  Patient Details Name: Brittany [name] MRN: [id number] DOB: 09/20/1960   Cancelled Treatment:    Reason Eval/Treat Not Completed: Medical issues which prohibited therapy (okay to hold  PT today per MD. ongoing medical issues including hyperkalemia. PT to follow up tomorrow as appropriate)  Minna Merritts, PT, MPT  Percell Locus 02/16/2022, 1:30 PM

## 2022-02-16 NOTE — Progress Notes (Signed)
Progress Note    KAYBREE WILLIAMS  XNT:[id number] DOB: [birth year]  DOA: 02/09/2022 PCP: [name], NP      Brief Narrative:    Medical records reviewed and are as summarized below:  ROSANNA BICKLE is a 62 y.o. 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 difficile infection, who presented to the hospital with hypoglycemia and seizures.  Reportedly, glucose levels was under 40s and dropped into the 30s.  He was started on 10% dextrose infusion by EMS and brought to the emergency department.        Assessment/Plan:   Principal Problem:   Seizure ([place]) Active Problems:   Hyperkalemia   Acute metabolic encephalopathy   Hypoglycemia   Hypothermia   Hypotension   Chronic diastolic CHF (congestive heart failure) (HCC)   Diabetes mellitus without complication (HCC)   History of stroke   HLD (hyperlipidemia)   Depression with anxiety   COPD (chronic obstructive pulmonary disease) (HCC)   Tobacco dependence   Elevated lactic acid level   HTN (hypertension)    Body mass index is 22.56 kg/m.   Seizures prior to admission: This was probably due to hypoglycemia.  No seizures on admission.  Continue Depakote.  IV Ativan as needed for seizures.  Type II DM with hyperglycemia, recent hypoglycemia, labile glucose levels: Continue insulin glargine 11 units twice daily.  Continue NovoLog 2 units 3 times daily with meals and NovoLog as needed for hyperglycemia.  Hemoglobin A1c on 02/14/2022 was 11.  Hemoglobin A1c was 13.2 on 10/30/2021.    Refractory hyperkalemia: Potassium went from 6-6.8.  EKG showed normal sinus rhythm without changes from hyperkalemia.  She was given IV calcium gluconate, albuterol 10 mg via nebulizer.  She was started on Veltassa since she had not responded well  to [facility].  Dr. Holley Raring, nephrologist was consulted.  He ordered additional dose of Lokelma for today.   Hypotension: BP dropped to 75/63 today.  She was given boluses of normal saline for this.  Cortisol level 7.8.   ACTH stimulation test results are as follows: Baseline cortisol 3.0, cortisol after 30 minutes 25.2 peak cortisol after 60 minutes 27.7.  This rules out adrenal insufficiency. Start midodrine for hypotension.   Acute metabolic encephalopathy, lethargy: She was lethargic again today but this improved with IV fluids and treatment of hyperkalemia.   General weakness: PT and OT was postponed because she was not stable for therapy today.  Hypothermia: Resolved  Other comorbidities include chronic diastolic CHF, depression, anxiety, hypertension, tobacco use disorder  Elevated lactic acid level: Chart review shows patient has had elevated lactic acid levels in the past   Diet Order             Diet heart healthy/carb modified Room service appropriate? Yes; Fluid consistency: Thin  Diet effective now                            Consultants: None  Procedures: None    Medications:    aspirin EC  81 mg Oral Daily   atorvastatin  40 mg Oral QHS   buPROPion  150 mg Oral Daily   diltiazem  120 mg Oral Daily   divalproex  500 mg Oral BID   enoxaparin (LOVENOX) injection  40 mg Subcutaneous Q24H   famotidine  20 mg Oral Daily   feeding supplement (GLUCERNA SHAKE)  237 mL Oral TID BM   insulin aspart  0-5 Units Subcutaneous QHS   insulin aspart  0-9 Units Subcutaneous TID WC   insulin aspart  2 Units Subcutaneous TID WC   insulin glargine-yfgn  11 Units Subcutaneous BID   melatonin  2.5 mg Oral QHS   mirtazapine  15 mg Oral QHS   multivitamin with minerals  1 tablet Oral Daily   nicotine  21 mg Transdermal Daily   patiromer  16.8 g Oral Daily   QUEtiapine  50 mg Oral QHS   rOPINIRole  0.5 mg Oral QHS   Continuous Infusions:  sodium chloride 75  mL/hr at 02/16/22 1500     Anti-infectives (From admission, onward)    Start     Dose/Rate Route Frequency Ordered Stop   02/09/22 1345  piperacillin-tazobactam (ZOSYN) IVPB 3.375 g        3.375 g 100 mL/hr over 30 Minutes Intravenous  Once 02/09/22 1336 02/09/22 1518              Family Communication/Anticipated D/C date and plan/Code Status   DVT prophylaxis: enoxaparin (LOVENOX) injection 40 mg Start: 02/09/22 1600     Code Status: Full Code  Family Communication: None Disposition Plan: Plan to discharge to SNF in 1 to 2 days  Status is: Inpatient Remains inpatient appropriate because: Hyperkalemia         Subjective:   Interval events noted.  Currently, RN, reported patient was lethargic and confused.  Her blood pressure had dropped to 75/63.  She was given normal saline bolus.  By the time I saw her, her mental status was back to baseline.  She said that her blood pressure tends to run low but she could not tell me how low it goes.  Objective:    Vitals:   02/16/22 1012 02/16/22 1038 02/16/22 1149 02/16/22 1518  BP: (!) 86/66 98/72 101/71 113/79  Pulse: (!) 101 93 88 86  Resp: '16 16 16 11  '$ Temp:   97.7 F (36.5 C) 97.6 F (36.4 C)  TempSrc:   Axillary Oral  SpO2: 95% 93% 98% 99%  Weight:      Height:       No data found.   Intake/Output Summary (Last 24 hours) at 02/16/2022 1601 Last data filed at 02/16/2022 1500 Gross per 24 hour  Intake 2717.73 ml  Output 2650 ml  Net 67.73 ml   Filed Weights   02/09/22 1345 02/13/22 0324  Weight: 54.9 kg 52.4 kg    Exam:  GEN: NAD SKIN: No rash EYES: EOMI ENT: MMM CV: RRR PULM: CTA B ABD: soft, ND, NT, +BS CNS: AAO x 3, non focal EXT: No edema or tenderness     Data Reviewed:   I have personally reviewed following labs and imaging studies:  Labs: Labs show the following:   Basic Metabolic Panel: Recent Labs  Lab 02/10/22 0750 02/10/22 1403 02/14/22 0922 02/15/22 0426  02/16/22 0443  NA 139  --  139 137 134*  K 6.4*   < > 5.6* 6.0* 6.8*  CL 115*  --  109 108 105  CO2 21*  --  24 23 21*  GLUCOSE 265*  --  196* 422* 505*  BUN 10  --  20 25* 24*  CREATININE 0.87  --  0.92 1.00 1.04*  CALCIUM 8.3*  --  8.6* 8.5* 8.2*   < > = values in this interval  not displayed.   GFR Estimated Creatinine Clearance: 40.3 mL/min (A) (by C-G formula based on SCr of 1.04 mg/dL (H)). Liver Function Tests: No results for input(s): "AST", "ALT", "ALKPHOS", "BILITOT", "PROT", "ALBUMIN" in the last 168 hours. No results for input(s): "LIPASE", "AMYLASE" in the last 168 hours. No results for input(s): "AMMONIA" in the last 168 hours. Coagulation profile No results for input(s): "INR", "PROTIME" in the last 168 hours.   CBC: Recent Labs  Lab 02/10/22 0750 02/15/22 0426  WBC 5.6 7.4  NEUTROABS  --  2.4  HGB 10.5* 10.7*  HCT 33.0* 33.7*  MCV 87.8 88.0  PLT 217 281   Cardiac Enzymes: No results for input(s): "CKTOTAL", "CKMB", "CKMBINDEX", "TROPONINI" in the last 168 hours. BNP (last 3 results) No results for input(s): "PROBNP" in the last 8760 hours. CBG: Recent Labs  Lab 02/16/22 0808 02/16/22 0852 02/16/22 0922 02/16/22 1018 02/16/22 1139  GLUCAP 178* 76 88 118* 97   D-Dimer: No results for input(s): "DDIMER" in the last 72 hours. Hgb A1c: Recent Labs    02/14/22 0922  HGBA1C 11.0*   Lipid Profile: No results for input(s): "CHOL", "HDL", "LDLCALC", "TRIG", "CHOLHDL", "LDLDIRECT" in the last 72 hours. Thyroid function studies: No results for input(s): "TSH", "T4TOTAL", "T3FREE", "THYROIDAB" in the last 72 hours.  Invalid input(s): "FREET3" Anemia work up: No results for input(s): "VITAMINB12", "FOLATE", "FERRITIN", "TIBC", "IRON", "RETICCTPCT" in the last 72 hours. Sepsis Labs: Recent Labs  Lab 02/09/22 1903 02/10/22 0750 02/15/22 0426  WBC  --  5.6 7.4  LATICACIDVEN 3.7* 2.1* 2.6*    Microbiology Recent Results (from the past 240  hour(s))  Urine Culture     Status: None   Collection Time: 02/09/22  1:27 PM   Specimen: In/Out Cath Urine  Result Value Ref Range Status   Specimen Description   Final    IN/OUT CATH URINE Performed at [facility], [address], [place], [place] [address]    Special Requests   Final    NONE Performed at [facility], [address] [place], [place] [address]    Culture   Final    NO GROWTH Performed at [facility], [place] [address] [place], [place] [address]    Report Status 02/10/2022 FINAL  Final  Blood Culture (routine x 2)     Status: None   Collection Time: 02/09/22  1:31 PM   Specimen: BLOOD  Result Value Ref Range Status   Specimen Description BLOOD LEFT WRIST  Final   Special Requests   Final    BOTTLES DRAWN AEROBIC AND ANAEROBIC Blood Culture adequate volume   Culture   Final    NO GROWTH 5 DAYS Performed at [facility], [address], [place], [place] [address]    Report Status 02/14/2022 FINAL  Final  Resp Panel by RT-PCR (Flu A&B, Covid) Anterior Nasal Swab     Status: None   Collection Time: 02/09/22  1:31 PM   Specimen: Anterior Nasal Swab  Result Value Ref Range Status   SARS Coronavirus 2 by RT PCR NEGATIVE NEGATIVE Final    Comment: (NOTE) SARS-CoV-2 target nucleic acids are NOT DETECTED.  The SARS-CoV-2 RNA is generally detectable in upper respiratory specimens during the acute phase of infection. The lowest concentration of SARS-CoV-2 viral copies this assay can detect is 138 copies/mL. A negative result does not preclude SARS-Cov-2 infection and should not be used as the sole basis for treatment or other patient management decisions.  A negative result may occur with  improper specimen collection/handling, submission of specimen other than nasopharyngeal swab, presence of viral mutation(s) within the areas targeted by this assay, and inadequate number of viral copies(<138 copies/mL). A negative result must  be combined with clinical observations, patient history, and epidemiological information. The expected result is Negative.  Fact Sheet for Patients:  EntrepreneurPulse.com.au  Fact Sheet for Healthcare Providers:  IncredibleEmployment.be  This test is no t yet approved or cleared by the [place] FDA and  has been authorized for detection and/or diagnosis of SARS-CoV-2 by FDA under an Emergency Use Authorization (EUA). This EUA will remain  in effect (meaning this test can be used) for the duration of the COVID-19 declaration under Section 564(b)(1) of the Act, 21 U.S.C.section 360bbb-3(b)(1), unless the authorization is terminated  or revoked sooner.       Influenza A by PCR NEGATIVE NEGATIVE Final   Influenza B by PCR NEGATIVE NEGATIVE Final    Comment: (NOTE) The Xpert Xpress SARS-CoV-2/FLU/RSV plus assay is intended as an aid in the diagnosis of influenza from Nasopharyngeal swab specimens and should not be used as a sole basis for treatment. Nasal washings and aspirates are unacceptable for Xpert Xpress SARS-CoV-2/FLU/RSV testing.  Fact Sheet for Patients: EntrepreneurPulse.com.au  Fact Sheet for Healthcare Providers: IncredibleEmployment.be  This test is not yet approved or cleared by the [place] FDA and has been authorized for detection and/or diagnosis of SARS-CoV-2 by FDA under an Emergency Use Authorization (EUA). This EUA will remain in effect (meaning this test can be used) for the duration of the COVID-19 declaration under Section 564(b)(1) of the Act, 21 U.S.C. section 360bbb-3(b)(1), unless the authorization is terminated or revoked.  Performed at [facility], [address] [place], [place] [address]   Blood Culture (routine x 2)     Status: None   Collection Time: 02/09/22  3:30 PM   Specimen: BLOOD RIGHT HAND  Result Value Ref Range Status   Specimen  Description BLOOD RIGHT HAND  Final   Special Requests   Final    BOTTLES DRAWN AEROBIC AND ANAEROBIC Blood Culture results may not be optimal due to an inadequate volume of blood received in culture bottles   Culture   Final    NO GROWTH 5 DAYS Performed at [facility], [address], [place], [place] [address]    Report Status 02/14/2022 FINAL  Final    Procedures and diagnostic studies:  No results found.             LOS: 1 day   Aissa Lisowski  [facility]   Pager on [email or web address]. If 7PM-7AM, please contact night-coverage at [email or web address]     02/16/2022, 4:01 PM

## 2022-02-16 NOTE — Progress Notes (Addendum)
OT Cancellation Note  Patient Details Name: Brittany [name] MRN: [id number] DOB: 1960/12/23   Cancelled Treatment:    Reason Eval/Treat Not Completed: Other (comment) (Pt potassium is 6.8, glucose is 505, BP is 75/63; RN reports pt is unable to stay awake at this time. OT will hold at this time, re-atempt as medically appropriate and as needed.)  Per MD hold on this date, OT will reattempt as able.  Shanon Payor, OTD OTR/L  02/16/22, 8:48 AM

## 2022-02-17 DIAGNOSIS — E875 Hyperkalemia: Secondary | ICD-10-CM | POA: Diagnosis not present

## 2022-02-17 DIAGNOSIS — G9341 Metabolic encephalopathy: Secondary | ICD-10-CM | POA: Diagnosis not present

## 2022-02-17 DIAGNOSIS — I959 Hypotension, unspecified: Secondary | ICD-10-CM | POA: Diagnosis not present

## 2022-02-17 DIAGNOSIS — R569 Unspecified convulsions: Secondary | ICD-10-CM | POA: Diagnosis not present

## 2022-02-17 LAB — BASIC METABOLIC PANEL
Anion gap: 4 — ABNORMAL LOW (ref 5–15)
BUN: 17 mg/dL (ref 8–23)
CO2: 21 mmol/L — ABNORMAL LOW (ref 22–32)
Calcium: 8.4 mg/dL — ABNORMAL LOW (ref 8.9–10.3)
Chloride: 117 mmol/L — ABNORMAL HIGH (ref 98–111)
Creatinine, Ser: 0.72 mg/dL (ref 0.44–1.00)
GFR, Estimated: >60 mL/min (ref >60)
Glucose, Bld: 124 mg/dL — ABNORMAL HIGH (ref 70–99)
Potassium: 4.7 mmol/L (ref 3.5–5.1)
Sodium: 142 mmol/L (ref 135–145)

## 2022-02-17 LAB — CBC WITH DIFFERENTIAL/PLATELET
Abs Immature Granulocytes: 0.09 10*3/uL — ABNORMAL HIGH (ref 0.00–0.07)
Basophils Absolute: 0.1 10*3/uL (ref 0.0–0.1)
Basophils Relative: 1 %
Eosinophils Absolute: 0.1 10*3/uL (ref 0.0–0.5)
Eosinophils Relative: 1 %
HCT: 28.7 % — ABNORMAL LOW (ref 36.0–46.0)
Hemoglobin: 9 g/dL — ABNORMAL LOW (ref 12.0–15.0)
Immature Granulocytes: 1 %
Lymphocytes Relative: 50 %
Lymphs Abs: 5.5 10*3/uL — ABNORMAL HIGH (ref 0.7–4.0)
MCH: 28 pg (ref 26.0–34.0)
MCHC: 31.4 g/dL (ref 30.0–36.0)
MCV: 89.4 fL (ref 80.0–100.0)
Monocytes Absolute: 1.1 10*3/uL — ABNORMAL HIGH (ref 0.1–1.0)
Monocytes Relative: 10 %
Neutro Abs: 4 10*3/uL (ref 1.7–7.7)
Neutrophils Relative %: 37 %
Platelets: 277 10*3/uL (ref 150–400)
RBC: 3.21 MIL/uL — ABNORMAL LOW (ref 3.87–5.11)
RDW: 22 % — ABNORMAL HIGH (ref 11.5–15.5)
WBC: 10.8 10*3/uL — ABNORMAL HIGH (ref 4.0–10.5)
nRBC: 1.2 % — ABNORMAL HIGH (ref 0.0–0.2)

## 2022-02-17 LAB — GLUCOSE, CAPILLARY
Glucose-Capillary: 73 mg/dL (ref 70–99)
Glucose-Capillary: 205 mg/dL — ABNORMAL HIGH (ref 70–99)
Glucose-Capillary: 270 mg/dL — ABNORMAL HIGH (ref 70–99)
Glucose-Capillary: 247 mg/dL — ABNORMAL HIGH (ref 70–99)

## 2022-02-17 LAB — LACTIC ACID, PLASMA: Lactic Acid, Venous: 1.5 mmol/L (ref 0.5–1.9)

## 2022-02-17 NOTE — Progress Notes (Deleted)
Patient taken off the floor via bed to dialysis. Per patient request. Prn dilaudid and prn ativan prior as pre treatment for dialysis due to history of pain and discomfort while getting dialysis. Patient taken off the floor on 02 at 3l

## 2022-02-17 NOTE — Progress Notes (Signed)
[facility]  PROGRESS NOTE   Subjective:   Awake and alert.  Comfortable.  Objective:  Vital signs: Blood pressure 121/85, pulse 84, temperature 98.2 F (36.8 C), temperature source Oral, resp. rate 18, height 5' (1.524 m), weight 52.4 kg, SpO2 100 %.  Intake/Output Summary (Last 24 hours) at 02/17/2022 1517 Last data filed at 02/17/2022 1450 Gross per 24 hour  Intake 2006.85 ml  Output 1950 ml  Net 56.85 ml   Filed Weights   02/09/22 1345 02/13/22 0324  Weight: 54.9 kg 52.4 kg     Physical Exam: General:  No acute distress  Head:  Normocephalic, atraumatic. Moist oral mucosal membranes  Eyes:  Anicteric  Neck:  Supple  Lungs:   Clear to auscultation, normal effort  Heart:  S1S2 no rubs  Abdomen:   Soft, nontender, bowel sounds present  Extremities:  peripheral edema.  Neurologic:  Awake, alert, following commands  Skin:  No lesions  Access:     Basic Metabolic Panel: Recent Labs  Lab 02/14/22 0922 02/15/22 0426 02/16/22 0443 02/16/22 1713 02/17/22 0414  NA 139 137 134*  --  142  K 5.6* 6.0* 6.8* 6.0* 4.7  CL 109 108 105  --  117*  CO2 24 23 21*  --  21*  GLUCOSE 196* 422* 505*  --  124*  BUN 20 25* 24*  --  17  CREATININE 0.92 1.00 1.04*  --  0.72  CALCIUM 8.6* 8.5* 8.2*  --  8.4*   GFR: Estimated Creatinine Clearance: 52.4 mL/min (by C-G formula based on SCr of 0.72 mg/dL).  Liver Function Tests: No results for input(s): "AST", "ALT", "ALKPHOS", "BILITOT", "PROT", "ALBUMIN" in the last 168 hours. No results for input(s): "LIPASE", "AMYLASE" in the last 168 hours. No results for input(s): "AMMONIA" in the last 168 hours.  CBC: Recent Labs  Lab 02/15/22 0426 02/17/22 0414  WBC 7.4 10.8*  NEUTROABS 2.4 4.0  HGB 10.7* 9.0*  HCT 33.7* 28.7*  MCV 88.0 89.4  PLT 281 277     HbA1C: Hgb A1c MFr Bld  Date/Time Value Ref Range Status  02/14/2022 09:22 AM 11.0 (H) 4.8 - 5.6 % Final    Comment:    (NOTE)         Prediabetes: 5.7 -  6.4         Diabetes: >6.4         Glycemic control for adults with diabetes: <7.0   10/30/2021 10:04 AM 13.2 (H) 4.8 - 5.6 % Final    Comment:    (NOTE) Pre diabetes:          5.7%-6.4%  Diabetes:              >6.4%  Glycemic control for   <7.0% adults with diabetes     Urinalysis: No results for input(s): "COLORURINE", "LABSPEC", "PHURINE", "GLUCOSEU", "HGBUR", "BILIRUBINUR", "KETONESUR", "PROTEINUR", "UROBILINOGEN", "NITRITE", "LEUKOCYTESUR" in the last 72 hours.  Invalid input(s): "APPERANCEUR"    Imaging: No results found.   Medications:    sodium chloride 75 mL/hr at 02/17/22 1105    aspirin EC  81 mg Oral Daily   atorvastatin  40 mg Oral QHS   buPROPion  150 mg Oral Daily   divalproex  500 mg Oral BID   enoxaparin (LOVENOX) injection  40 mg Subcutaneous Q24H   famotidine  20 mg Oral Daily   feeding supplement (GLUCERNA SHAKE)  237 mL Oral TID BM   insulin aspart  0-5 Units Subcutaneous QHS  insulin aspart  0-9 Units Subcutaneous TID WC   insulin aspart  2 Units Subcutaneous TID WC   insulin glargine-yfgn  11 Units Subcutaneous BID   melatonin  2.5 mg Oral QHS   midodrine  5 mg Oral TID WC   mirtazapine  15 mg Oral QHS   multivitamin with minerals  1 tablet Oral Daily   nicotine  21 mg Transdermal Daily   patiromer  16.8 g Oral Daily   QUEtiapine  50 mg Oral QHS   rOPINIRole  0.5 mg Oral QHS    Assessment/ Plan:     62 y.o.  female with past medical history of COPD, diabetes, hypertension, lupus, pancreatic cancer, seizures, RLS, who was admitted to [facility] on 02/09/2022 for Seizure ([place]) [R56.9] Hypoglycemia [E16.2] SIRS (systemic inflammatory response syndrome) ([place]) [R65.10] Hyperkalemia [E87.5]  #1: Hyperkalemia: Hyperkalemia most likely secondary to type IV renal tubular acidosis.  Potassium is improved now at this time.  Will continue the [facility] as needed.  #2: Hypotension: Improved with IV fluids.  #3: Diabetes: Continue insulin  protocols.    LOS: 2 Lyla Son, MD [facility] 1/6/20243:17 PM

## 2022-02-17 NOTE — Progress Notes (Addendum)
Progress Note    Brittany [name]  EYC:[id number] DOB: 1960/07/22  DOA: 02/09/2022 PCP: [name], NP      Brief Narrative:    Medical records reviewed and are as summarized below:  Brittany [name] is a 62 y.o. 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 difficile infection, who presented to the hospital with hypoglycemia and seizures.  Reportedly, glucose levels was under 40s and dropped into the 30s.  He was started on 10% dextrose infusion by EMS and brought to the emergency department.        Assessment/Plan:   Principal Problem:   Seizure ([place]) Active Problems:   Hyperkalemia   Acute metabolic encephalopathy   Hypoglycemia   Hypothermia   Hypotension   Chronic diastolic CHF (congestive heart failure) (HCC)   Diabetes mellitus without complication (HCC)   History of stroke   HLD (hyperlipidemia)   Depression with anxiety   COPD (chronic obstructive pulmonary disease) (HCC)   Tobacco dependence   Elevated lactic acid level   HTN (hypertension)    Body mass index is 22.56 kg/m.   Seizures prior to admission: This was probably due to hypoglycemia.  No seizures on admission.  Continue Depakote.  IV Ativan as needed for seizures.  Type II DM with hyperglycemia, recent hypoglycemia, labile glucose levels: Continue insulin glargine 11 units twice daily and NovoLog 2 units 3 times daily with meals.  Use NovoLog as needed for hyperglycemia. Hemoglobin A1c on 02/14/2022 was 11.  Hemoglobin A1c was 13.2 on 10/30/2021.    Refractory hyperkalemia: Improved.  Continue Veltassa for now.  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  Follow-up with nephrologist.   Hypotension: BP is better.  Discontinue IV fluids.  Cardizem has been discontinued as well.  She was started on midodrine but this  will be discontinued.  Monitor BP closely.  Cortisol level 7.8.   ACTH stimulation test results are as follows: Baseline cortisol 3.0, cortisol after 30 minutes 25.2 peak cortisol after 60 minutes 27.7.  This rules out adrenal insufficiency.   Acute metabolic encephalopathy, lethargy: Resolved   General weakness: PT recommends home health therapy   Hypothermia: Resolved   Other comorbidities include chronic diastolic CHF, depression, anxiety, hypertension, tobacco use disorder  Elevated lactic acid level: Chart review shows patient has had elevated lactic acid levels in the past   Diet Order             Diet heart healthy/carb modified Room service appropriate? Yes; Fluid consistency: Thin  Diet effective now                            Consultants: None  Procedures: None    Medications:    aspirin EC  81 mg Oral Daily   atorvastatin  40 mg Oral QHS   buPROPion  150 mg Oral Daily   divalproex  500 mg Oral BID   enoxaparin (LOVENOX) injection  40 mg Subcutaneous Q24H   famotidine  20 mg Oral Daily   feeding supplement (GLUCERNA SHAKE)  237 mL Oral TID BM   insulin aspart  0-5 Units Subcutaneous QHS   insulin aspart  0-9 Units Subcutaneous TID WC   insulin aspart  2 Units Subcutaneous TID WC   insulin glargine-yfgn  11 Units Subcutaneous BID   melatonin  2.5  mg Oral QHS   mirtazapine  15 mg Oral QHS   multivitamin with minerals  1 tablet Oral Daily   nicotine  21 mg Transdermal Daily   patiromer  16.8 g Oral Daily   QUEtiapine  50 mg Oral QHS   rOPINIRole  0.5 mg Oral QHS   Continuous Infusions:  sodium chloride 75 mL/hr at 02/17/22 1105     Anti-infectives (From admission, onward)    Start     Dose/Rate Route Frequency Ordered Stop   02/09/22 1345  piperacillin-tazobactam (ZOSYN) IVPB 3.375 g        3.375 g 100 mL/hr over 30 Minutes Intravenous  Once 02/09/22 1336 02/09/22 1518              Family Communication/Anticipated D/C  date and plan/Code Status   DVT prophylaxis: enoxaparin (LOVENOX) injection 40 mg Start: 02/09/22 1600     Code Status: Full Code  Family Communication: None Disposition Plan: Plan to discharge to SNF in 1 to 2 days  Status is: Inpatient Remains inpatient appropriate because: Hyperkalemia         Subjective:   Interval events noted.  She has no complaints.  She feels better today.   Objective:    Vitals:   02/16/22 2005 02/16/22 2250 02/17/22 0736 02/17/22 1124  BP: 100/81 108/81 100/68 121/85  Pulse: 99 93 77 84  Resp: '18 18 16 18  '$ Temp: 97.7 F (36.5 C) 97.7 F (36.5 C) 98.4 F (36.9 C) 98.2 F (36.8 C)  TempSrc: Oral  Oral Oral  SpO2: 97% 98% 97% 100%  Weight:      Height:       No data found.   Intake/Output Summary (Last 24 hours) at 02/17/2022 1520 Last data filed at 02/17/2022 1450 Gross per 24 hour  Intake 2006.85 ml  Output 1950 ml  Net 56.85 ml   Filed Weights   02/09/22 1345 02/13/22 0324  Weight: 54.9 kg 52.4 kg    Exam:  GEN: NAD SKIN: No rash EYES: EOMI ENT: MMM CV: RRR PULM: CTA B ABD: soft, ND, NT, +BS CNS: AAO x 3, non focal EXT: No edema or tenderness     Data Reviewed:   I have personally reviewed following labs and imaging studies:  Labs: Labs show the following:   Basic Metabolic Panel: Recent Labs  Lab 02/14/22 0922 02/15/22 0426 02/16/22 0443 02/16/22 1713 02/17/22 0414  NA 139 137 134*  --  142  K 5.6* 6.0* 6.8* 6.0* 4.7  CL 109 108 105  --  117*  CO2 24 23 21*  --  21*  GLUCOSE 196* 422* 505*  --  124*  BUN 20 25* 24*  --  17  CREATININE 0.92 1.00 1.04*  --  0.72  CALCIUM 8.6* 8.5* 8.2*  --  8.4*   GFR Estimated Creatinine Clearance: 52.4 mL/min (by C-G formula based on SCr of 0.72 mg/dL). Liver Function Tests: No results for input(s): "AST", "ALT", "ALKPHOS", "BILITOT", "PROT", "ALBUMIN" in the last 168 hours. No results for input(s): "LIPASE", "AMYLASE" in the last 168 hours. No results for  input(s): "AMMONIA" in the last 168 hours. Coagulation profile No results for input(s): "INR", "PROTIME" in the last 168 hours.   CBC: Recent Labs  Lab 02/15/22 0426 02/17/22 0414  WBC 7.4 10.8*  NEUTROABS 2.4 4.0  HGB 10.7* 9.0*  HCT 33.7* 28.7*  MCV 88.0 89.4  PLT 281 277   Cardiac Enzymes: No results for input(s): "CKTOTAL", "CKMB", "CKMBINDEX", "  TROPONINI" in the last 168 hours. BNP (last 3 results) No results for input(s): "PROBNP" in the last 8760 hours. CBG: Recent Labs  Lab 02/16/22 1139 02/16/22 1704 02/16/22 2008 02/17/22 0734 02/17/22 1121  GLUCAP 97 297* 232* 73 205*   D-Dimer: No results for input(s): "DDIMER" in the last 72 hours. Hgb A1c: No results for input(s): "HGBA1C" in the last 72 hours.  Lipid Profile: No results for input(s): "CHOL", "HDL", "LDLCALC", "TRIG", "CHOLHDL", "LDLDIRECT" in the last 72 hours. Thyroid function studies: No results for input(s): "TSH", "T4TOTAL", "T3FREE", "THYROIDAB" in the last 72 hours.  Invalid input(s): "FREET3" Anemia work up: No results for input(s): "VITAMINB12", "FOLATE", "FERRITIN", "TIBC", "IRON", "RETICCTPCT" in the last 72 hours. Sepsis Labs: Recent Labs  Lab 02/15/22 0426 02/17/22 0414  WBC 7.4 10.8*  LATICACIDVEN 2.6* 1.5    Microbiology Recent Results (from the past 240 hour(s))  Urine Culture     Status: None   Collection Time: 02/09/22  1:27 PM   Specimen: In/Out Cath Urine  Result Value Ref Range Status   Specimen Description   Final    IN/OUT CATH URINE Performed at [facility], [address] [place], [place] [address]    Special Requests   Final    NONE Performed at [facility], [address] [place], [place] [address]    Culture   Final    NO GROWTH Performed at [facility], [place] [address] [place], [place] [address]    Report Status 02/10/2022 FINAL  Final  Blood Culture (routine x 2)     Status: None   Collection Time: 02/09/22  1:31 PM    Specimen: BLOOD  Result Value Ref Range Status   Specimen Description BLOOD LEFT WRIST  Final   Special Requests   Final    BOTTLES DRAWN AEROBIC AND ANAEROBIC Blood Culture adequate volume   Culture   Final    NO GROWTH 5 DAYS Performed at [facility], [address] [place],  [address]    Report Status 02/14/2022 FINAL  Final  Resp Panel by RT-PCR (Flu A&B, Covid) Anterior Nasal Swab     Status: None   Collection Time: 02/09/22  1:31 PM   Specimen: Anterior Nasal Swab  Result Value Ref Range Status   SARS Coronavirus 2 by RT PCR NEGATIVE NEGATIVE Final    Comment: (NOTE) SARS-CoV-2 target nucleic acids are NOT DETECTED.  The SARS-CoV-2 RNA is generally detectable in upper respiratory specimens during the acute phase of infection. The lowest concentration of SARS-CoV-2 viral copies this assay can detect is 138 copies/mL. A negative result does not preclude SARS-Cov-2 infection and should not be used as the sole basis for treatment or other patient management decisions. A negative result may occur with  improper specimen collection/handling, submission of specimen other than nasopharyngeal swab, presence of viral mutation(s) within the areas targeted by this assay, and inadequate number of viral copies(<138 copies/mL). A negative result must be combined with clinical observations, patient history, and epidemiological information. The expected result is Negative.  Fact Sheet for Patients:  EntrepreneurPulse.com.au  Fact Sheet for Healthcare Providers:  IncredibleEmployment.be  This test is no t yet approved or cleared by the [place] FDA and  has been authorized for detection and/or diagnosis of SARS-CoV-2 by FDA under an Emergency Use Authorization (EUA). This EUA will remain  in effect (meaning this test can be used) for the duration of the COVID-19 declaration under Section 564(b)(1) of the  Act, 21 U.S.C.section  360bbb-3(b)(1), unless the authorization is terminated  or revoked sooner.       Influenza A by PCR NEGATIVE NEGATIVE Final   Influenza B by PCR NEGATIVE NEGATIVE Final    Comment: (NOTE) The Xpert Xpress SARS-CoV-2/FLU/RSV plus assay is intended as an aid in the diagnosis of influenza from Nasopharyngeal swab specimens and should not be used as a sole basis for treatment. Nasal washings and aspirates are unacceptable for Xpert Xpress SARS-CoV-2/FLU/RSV testing.  Fact Sheet for Patients: EntrepreneurPulse.com.au  Fact Sheet for Healthcare Providers: IncredibleEmployment.be  This test is not yet approved or cleared by the [place] FDA and has been authorized for detection and/or diagnosis of SARS-CoV-2 by FDA under an Emergency Use Authorization (EUA). This EUA will remain in effect (meaning this test can be used) for the duration of the COVID-19 declaration under Section 564(b)(1) of the Act, 21 U.S.C. section 360bbb-3(b)(1), unless the authorization is terminated or revoked.  Performed at [facility], [address] [place], [place] [address]   Blood Culture (routine x 2)     Status: None   Collection Time: 02/09/22  3:30 PM   Specimen: BLOOD RIGHT HAND  Result Value Ref Range Status   Specimen Description BLOOD RIGHT HAND  Final   Special Requests   Final    BOTTLES DRAWN AEROBIC AND ANAEROBIC Blood Culture results may not be optimal due to an inadequate volume of blood received in culture bottles   Culture   Final    NO GROWTH 5 DAYS Performed at [facility], [address] [place], [place] [address]    Report Status 02/14/2022 FINAL  Final    Procedures and diagnostic studies:  No results found.             LOS: 2 days   Caoimhe Damron  [facility]   Pager on [email or web address]. If 7PM-7AM, please contact night-coverage at [email or web address]     02/17/2022, 3:20 PM

## 2022-02-17 NOTE — Evaluation (Signed)
Physical Therapy Evaluation Patient Details Name: Brittany [name] MRN: [id number] DOB: [birth year] Today's Date: 02/17/2022  History of Present Illness  Brittany [name] is a 62 y.o. 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 difficile, who presents on 02/09/22 with seizure. While in hospital, patient has experienced issues with blood glucose.  Clinical Impression  62 yo Female admitted to hospital with seizure and difficulty controlling glucose levels. Per chart, patient was living in assistive living prior to admittance. However when speaking with patient she states she was living at home and her daughter/granddaughters lived with her. No family present to confirm PLOF. Patient reports 2 falls in last 3 months which she states happened at the hospital. She reports that she was walking without an assistive device. Currently she is modified independent in bed mobility. She does require min A for sit<>stand transfer with RW. Patient ambulated in room x20 feet with RW, min guard for safety. She does exhibit some impulsivity requiring cues for safety awareness and positioning. She does exhibit some generalized weakness in BLE. Patient also reports occasional numbness/tingling in feet as a result of diabetic neuropathy. However intact light touch sensation present at evaluation. She would benefit from additional skilled PT Intervention to improve strength, balance and mobility. Recommend home health PT referral when patient returns to assistive living to address weakness and difficulty walking.        Recommendations for follow up therapy are one component of a multi-disciplinary discharge planning process, led by the attending physician.  Recommendations may be updated based on patient status, additional functional criteria  and insurance authorization.  Follow Up Recommendations Home health PT      Assistance Recommended at Discharge Frequent or constant Supervision/Assistance  Patient can return home with the following  A little help with walking and/or transfers;A little help with bathing/dressing/bathroom;Assistance with cooking/housework;Direct supervision/assist for medications management;Assist for transportation;Help with stairs or ramp for entrance    Equipment Recommendations None recommended by PT  Recommendations for Other Services       Functional Status Assessment Patient has had a recent decline in their functional status and demonstrates the ability to make significant improvements in function in a reasonable and predictable amount of time.     Precautions / Restrictions Precautions Precautions: Fall Precaution Comments: reports 2 falls in last 3 months Restrictions Weight Bearing Restrictions: No      Mobility  Bed Mobility Overal bed mobility: Modified Independent             General bed mobility comments: requires elevated head of bed/bed rails to push up on side of bed;    Transfers Overall transfer level: Needs assistance Equipment used: Rolling walker (2 wheels) Transfers: Sit to/from Stand Sit to Stand: Min assist           General transfer comment: requires cues for hand placement, increased time/effort to transfer;    Ambulation/Gait Ambulation/Gait assistance: Min guard Gait Distance (Feet): 20 Feet Assistive device: Rolling walker (2 wheels) Gait Pattern/deviations: Step-through pattern       General Gait Details: pt ambulates with reciprocal gait pattern, slight shuffled step, normal base of support; she is impulsive requiring min Guard for safety especially when negotiating obstacles.  Stairs            Wheelchair Mobility    Modified Rankin (Stroke Patients Only)  Balance Overall balance assessment: Needs  assistance Sitting-balance support: Bilateral upper extremity supported, Feet supported Sitting balance-Leahy Scale: Good     Standing balance support: Bilateral upper extremity supported, During functional activity (uses RW when walking. Requires min guard, impulsive) Standing balance-Leahy Scale: Poor                               Pertinent Vitals/Pain Pain Assessment Pain Assessment: No/denies pain    Home Living Family/patient expects to be discharged to:: Assisted living                   Additional Comments: No family present; Patient reported that she lives at home and daughter lives with her with her 2 kids. She reports being indpendent, not using any assistive device, still driving and will go grocery shopping; When speaking with RN, she reports patient can be confused; Confirmed chart review states patient is from assisted living and the plan is to return to assisted living;    Prior Function               Mobility Comments: From prior admission: Has SPC and RW, normally ambulates without AD       Hand Dominance   Dominant Hand: Right    Extremity/Trunk Assessment        Lower Extremity Assessment Lower Extremity Assessment: Generalized weakness       Communication   Communication: No difficulties  Cognition Arousal/Alertness: Awake/alert Behavior During Therapy: WFL for tasks assessed/performed Overall Cognitive Status: No family/caregiver present to determine baseline cognitive functioning                                 General Comments: Pt oriented to birthday, situation of why she is in the hospital, place; However when asked about her PLOF she reports that she was still living at home and her daughter lived with her. Per chart review, patient was living in assisted living;        General Comments      Exercises     Assessment/Plan    PT Assessment Patient needs continued PT services  PT Problem List  Decreased strength;Decreased balance;Decreased cognition;Decreased mobility;Decreased knowledge of use of DME;Decreased activity tolerance;Decreased safety awareness       PT Treatment Interventions DME instruction;Functional mobility training;Balance training;Patient/family education;Gait training;Therapeutic activities;Therapeutic exercise    PT Goals (Current goals can be found in the Care Plan section)  Acute Rehab PT Goals Patient Stated Goal: "Be independent" PT Goal Formulation: With patient Time For Goal Achievement: 03/03/22 Potential to Achieve Goals: Fair    Frequency Min 2X/week     Co-evaluation               AM-PAC PT "6 Clicks" Mobility  Outcome Measure Help needed turning from your back to your side while in a flat bed without using bedrails?: None Help needed moving from lying on your back to sitting on the side of a flat bed without using bedrails?: A Little Help needed moving to and from a bed to a chair (including a wheelchair)?: A Little Help needed standing up from a chair using your arms (e.g., wheelchair or bedside chair)?: A Little Help needed to walk in hospital room?: A Little Help needed climbing 3-5 steps with a railing? : A Little 6 Click Score: 19    End of Session Equipment Utilized  During Treatment: Gait belt Activity Tolerance: Patient tolerated treatment well Patient left: in chair;with call bell/phone within reach;with chair alarm set Nurse Communication: Mobility status;Other (comment) (IV beeping) PT Visit Diagnosis: Unsteadiness on feet (R26.81);Repeated falls (R29.6);Muscle weakness (generalized) (M62.81);History of falling (Z91.81);Difficulty in walking, not elsewhere classified (R26.2)    Time: 7871-8367 PT Time Calculation (min) (ACUTE ONLY): 23 min   Charges:   PT Evaluation $PT Eval Low Complexity: 1 Low           Karoline Fleer PT, DPT 02/17/2022, 1:47 PM

## 2022-02-17 NOTE — Progress Notes (Signed)
A consult was placed to the hospital's IV Nurse for a new IV site;  pt known to multiple IV Team staff;  pt with very poor veins and has had numerous iv restarts since admission;  scar tissue is noted;  able to place a new iv on the 2nd attempt with ultrasound;  suggest taking BP in opposite arm to help preserve this new iv site.

## 2022-02-17 NOTE — Evaluation (Signed)
Occupational Therapy Evaluation Patient Details Name: Brittany [name] MRN: [id number] DOB: Dec 30, 1960 Today's Date: 02/17/2022   History of Present Illness Brittany [name] is a 62 y.o. 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 difficile, who presents on 02/09/22 with seizure. While in hospital, patient has experienced issues with blood glucose.   Clinical Impression   Pt seen for OT evaluation and treatment this date.  Pt in bed on arrival.  Pt presents with muscle weakness, decreased functional mobility/transfers, decreased cognition/memory, decreased safety, decreased ability to perform ADL/IADL tasks, displays impulsivity.  Pt would benefit from skilled OT services to maximize safety and independence in necessary daily tasks.  Recommend 24 hour supervision and assistance as needed.  Pt will plan to return to assisted living at discharge.      Recommendations for follow up therapy are one component of a multi-disciplinary discharge planning process, led by the attending physician.  Recommendations may be updated based on patient status, additional functional criteria and insurance authorization.   Follow Up Recommendations        Assistance Recommended at Discharge    Patient can return home with the following      Functional Status Assessment     Equipment Recommendations       Recommendations for Other Services       Precautions / Restrictions Precautions Precautions: Fall Precaution Comments: reports 2 falls in last 3 months Restrictions Weight Bearing Restrictions: No      Mobility Bed Mobility Overal bed mobility: Modified Independent             General bed mobility comments: requires elevated head of bed/bed rails to push up on side of bed; Patient Response:  Impulsive  Transfers Overall transfer level: Needs assistance Equipment used: Rolling walker (2 wheels) Transfers: Sit to/from Stand Sit to Stand: Min guard           General transfer comment: Mobility performed with and without rolling walker.  Utilized walker with planned activities and after getting patient in bed, pt stood impulsively and started to go to the bathroom without an AD, gait belt in place and therapist providing min guard to min assist.      Balance Overall balance assessment: Needs assistance Sitting-balance support: Bilateral upper extremity supported, Feet supported Sitting balance-Leahy Scale: Good     Standing balance support: Bilateral upper extremity supported, During functional activity Standing balance-Leahy Scale: Poor                             ADL either performed or assessed with clinical judgement   ADL Overall ADL's : Needs assistance/impaired Eating/Feeding: Independent   Grooming: Standing;Set up;Min guard   Upper Body Bathing: Modified independent   Lower Body Bathing: Min guard   Upper Body Dressing : Modified independent   Lower Body Dressing: Min guard   Toilet Transfer: Min Psychiatric nurse Details (indicate cue type and reason): Pt impulsive with movement Toileting- Clothing Manipulation and Hygiene: Min guard       Functional mobility during ADLs: Min guard;Minimal assistance General ADL Comments: Min guard with RW for mobility, assist with iv pole     Vision         Perception     Praxis      Pertinent Vitals/Pain Pain Assessment Pain Assessment: No/denies pain  Hand Dominance Right   Extremity/Trunk Assessment Upper Extremity Assessment Upper Extremity Assessment: Generalized weakness   Lower Extremity Assessment Lower Extremity Assessment: Defer to PT evaluation       Communication Communication Communication: No difficulties   Cognition Arousal/Alertness:  Awake/alert Behavior During Therapy: WFL for tasks assessed/performed Overall Cognitive Status: No family/caregiver present to determine baseline cognitive functioning                                 General Comments: Pt able to state why she is in the hospital but unable to name the place and thinks she is in [place].  She reports she lives with her daughter however the chart indicates she lives in assisted living.     General Comments   Pt seen this date for toileting with min guard to min assist, hygiene at the sink in standing with min guard, pt self feeding independently with tray set up.  Pt impulsive at times and requires assist and cues for safety.      Exercises     Shoulder Instructions      Home Living Family/patient expects to be discharged to:: Assisted living                                 Additional Comments: No family present; Patient reported that she lives at home and daughter lives with her with her 2 kids. She reports being indpendent, not using any assistive device, still driving and will go grocery shopping; When speaking with RN, she reports patient can be confused; Confirmed chart review states patient is from assisted living and the plan is to return to assisted living;      Prior Functioning/Environment Prior Level of Function : Needs assist             Mobility Comments: From prior admission: Has SPC and RW, normally ambulates without AD ADLs Comments: Pt is a poor historian, she reports she was independent with her basic self care tasks and her daughter helps her with her medication and colostomy care however her chart indicates she lives in assisted living.        OT Problem List: Decreased strength;Decreased cognition;Decreased knowledge of use of DME or AE;Impaired balance (sitting and/or standing);Decreased safety awareness      OT Treatment/Interventions: Self-care/ADL training;Cognitive  remediation/compensation;Balance training;Therapeutic exercise;DME and/or AE instruction;Therapeutic activities;Patient/family education    OT Goals(Current goals can be found in the care plan section) Acute Rehab OT Goals Patient Stated Goal: to take care of myself OT Goal Formulation: With patient Time For Goal Achievement: 03/03/22 Potential to Achieve Goals: Fair ADL Goals Pt Will Perform Lower Body Dressing: with modified independence Pt Will Transfer to Toilet: with modified independence  OT Frequency: Min 2X/week    Co-evaluation              AM-PAC OT "6 Clicks" Daily Activity     Outcome Measure Help from another person eating meals?: None Help from another person taking care of personal grooming?: None Help from another person toileting, which includes using toliet, bedpan, or urinal?: A Little Help from another person bathing (including washing, rinsing, drying)?: A Little Help from another person to put on and taking off regular upper body clothing?: None Help from another person to put on and taking off regular lower body clothing?: None 6 Click Score: 22  End of Session Equipment Utilized During Treatment: Gait belt;Rolling walker (2 wheels) Nurse Communication: Mobility status;Other (comment) (iv beeping, nursing in to check iv)  Activity Tolerance: Patient tolerated treatment well Patient left: in bed;with call bell/phone within reach;with bed alarm set;with nursing/sitter in room  OT Visit Diagnosis: Unsteadiness on feet (R26.81);Muscle weakness (generalized) (M62.81)                Time: 9030-1499 OT Time Calculation (min): 36 min Charges:  OT General Charges $OT Visit: 1 Visit OT Evaluation $OT Eval Low Complexity: 1 Low OT Treatments $Self Care/Home Management : 8-22 mins  Ashey Tramontana T Anjalee Cope, OTR/L, CLT Marcia Hartwell 02/17/2022, 2:45 PM

## 2022-02-17 NOTE — TOC Progression Note (Signed)
Transition of Care [facility]) - Progression Note    Patient Details  Name: Brittany [name] MRN: [id number] Date of Birth: 04/01/1960  [facility] of [facility] CM/SW Contact  Izola Price, RN Phone Number: 02/17/2022, 2:36 PM  Clinical Narrative:  1/6: Discharge delayed d/t high potassium levels (6.8) and low blood pressure issues on 1/5 per provider notes.  Patient was not stable for therapy as well per provider notes. Simmie Davies RN CM     Barriers to Discharge: Continued Medical Work up  Expected Discharge Plan and Services                                               Social Determinants of Health (SDOH) Interventions SDOH Screenings   Food Insecurity: No Food Insecurity (12/30/2021)  Housing: Low Risk  (12/30/2021)  Transportation Needs: No Transportation Needs (12/30/2021)  Utilities: Not At Risk (12/30/2021)  Tobacco Use: High Risk (12/10/2021)    Readmission Risk Interventions    12/29/2021    1:48 PM 09/22/2021    1:24 PM 03/06/2021    9:13 AM  Readmission Risk Prevention Plan  Transportation Screening Complete Complete Complete  Medication Review Press photographer) Complete Complete Complete  PCP or Specialist appointment within 3-5 days of discharge  Complete Complete  HRI or Home Care Consult Complete Complete   SW Recovery Care/Counseling Consult Complete Complete   Palliative Care Screening Not Applicable Not Applicable Not [place] Not Applicable Complete Not Applicable

## 2022-02-18 DIAGNOSIS — R569 Unspecified convulsions: Secondary | ICD-10-CM | POA: Diagnosis not present

## 2022-02-18 DIAGNOSIS — E875 Hyperkalemia: Secondary | ICD-10-CM | POA: Diagnosis not present

## 2022-02-18 LAB — POTASSIUM: Potassium: 5.7 mmol/L — ABNORMAL HIGH (ref 3.5–5.1)

## 2022-02-18 LAB — BASIC METABOLIC PANEL
Anion gap: 3 — ABNORMAL LOW (ref 5–15)
BUN: 20 mg/dL (ref 8–23)
CO2: 24 mmol/L (ref 22–32)
Calcium: 8.5 mg/dL — ABNORMAL LOW (ref 8.9–10.3)
Chloride: 114 mmol/L — ABNORMAL HIGH (ref 98–111)
Creatinine, Ser: 0.82 mg/dL (ref 0.44–1.00)
GFR, Estimated: >60 mL/min (ref >60)
Glucose, Bld: 123 mg/dL — ABNORMAL HIGH (ref 70–99)
Potassium: 5.2 mmol/L — ABNORMAL HIGH (ref 3.5–5.1)
Sodium: 141 mmol/L (ref 135–145)

## 2022-02-18 LAB — GLUCOSE, CAPILLARY
Glucose-Capillary: 116 mg/dL — ABNORMAL HIGH (ref 70–99)
Glucose-Capillary: 91 mg/dL (ref 70–99)
Glucose-Capillary: 311 mg/dL — ABNORMAL HIGH (ref 70–99)
Glucose-Capillary: 194 mg/dL — ABNORMAL HIGH (ref 70–99)
Glucose-Capillary: 147 mg/dL — ABNORMAL HIGH (ref 70–99)

## 2022-02-18 LAB — LACTIC ACID, PLASMA: Lactic Acid, Venous: 1.2 mmol/L (ref 0.5–1.9)

## 2022-02-18 MED ORDER — FUROSEMIDE 20 MG PO TABS
20.0000 mg | ORAL_TABLET | Freq: Every day | ORAL | Status: DC
  Administered 2022-02-18 – 2022-03-01 (×12): 20 mg via ORAL
  Filled 2022-02-18 (×13): qty 1

## 2022-02-18 MED ORDER — SODIUM ZIRCONIUM CYCLOSILICATE 10 G PO PACK
10.0000 g | PACK | Freq: Two times a day (BID) | ORAL | Status: DC
  Administered 2022-02-18 – 2022-02-20 (×4): 10 g via ORAL
  Filled 2022-02-18 (×4): qty 1

## 2022-02-18 MED ORDER — SODIUM CHLORIDE 0.9% FLUSH
10.0000 mL | Freq: Two times a day (BID) | INTRAVENOUS | Status: DC
  Administered 2022-02-18 – 2022-03-01 (×20): 10 mL

## 2022-02-18 NOTE — Progress Notes (Signed)

## 2022-02-18 NOTE — Progress Notes (Addendum)
Progress Note    Brittany [name]  OZH:[id number] DOB: 03-Sep-1960  DOA: 02/09/2022 PCP: [name], NP      Brief Narrative:    Medical records reviewed and are as summarized below:  Brittany [name] is a 62 y.o. 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 difficile infection, who presented to the hospital with hypoglycemia and seizures.  Reportedly, glucose levels was under 40s and dropped into the 30s.  He was started on 10% dextrose infusion by EMS and brought to the emergency department.        Assessment/Plan:   Principal Problem:   Seizure ([place]) Active Problems:   Hyperkalemia   Acute metabolic encephalopathy   Hypoglycemia   Hypothermia   Hypotension   Chronic diastolic CHF (congestive heart failure) (HCC)   Diabetes mellitus without complication (HCC)   History of stroke   HLD (hyperlipidemia)   Depression with anxiety   COPD (chronic obstructive pulmonary disease) (HCC)   Tobacco dependence   Elevated lactic acid level   HTN (hypertension)    Body mass index is 22.56 kg/m.   Seizures prior to admission: This was probably due to hypoglycemia.  No seizures on admission.  Continue Depakote.  Ativan as needed for seizures.    Type II DM with hyperglycemia, recent hypoglycemia, labile glucose levels: Continue insulin glargine 11 units twice daily and NovoLog 2 units 3 times daily with meals.  Use NovoLog as needed for hyperglycemia. Hemoglobin A1c on 02/14/2022 was 11.  Hemoglobin A1c was 13.2 on 10/30/2021.    Refractory hyperkalemia: Potassium went up again from 4.7-5.2.  Repeat potassium today and BMP tomorrow.  Continue Veltassa for now.  She has been started on Lasix by nephrologist.  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  Follow-up with nephrologist.   Hypotension: BP  is better off of IV fluids and midodrine.  Cardizem has been discontinued.. Monitor BP closely.  Cortisol level 7.8.   ACTH stimulation test results are as follows: Baseline cortisol 3.0, cortisol after 30 minutes 25.2 peak cortisol after 60 minutes 27.7.  This rules out adrenal insufficiency.   Acute metabolic encephalopathy, lethargy: Resolved   General weakness: PT recommends home health therapy   Hypothermia: Resolved   Other comorbidities include chronic diastolic CHF, depression, anxiety, hypertension, tobacco use disorder  Lactic acidosis: Resolved     Diet Order             Diet heart healthy/carb modified Room service appropriate? Yes; Fluid consistency: Thin  Diet effective now                            Consultants: None  Procedures: None    Medications:    aspirin EC  81 mg Oral Daily   atorvastatin  40 mg Oral QHS   buPROPion  150 mg Oral Daily   divalproex  500 mg Oral BID   enoxaparin (LOVENOX) injection  40 mg Subcutaneous Q24H   famotidine  20 mg Oral Daily   feeding supplement (GLUCERNA SHAKE)  237 mL Oral TID BM   furosemide  20 mg Oral Daily   insulin aspart  0-5 Units Subcutaneous QHS   insulin aspart  0-9 Units Subcutaneous TID WC   insulin aspart  2 Units Subcutaneous TID WC   insulin glargine-yfgn  11 Units Subcutaneous  BID   melatonin  2.5 mg Oral QHS   mirtazapine  15 mg Oral QHS   multivitamin with minerals  1 tablet Oral Daily   nicotine  21 mg Transdermal Daily   patiromer  16.8 g Oral Daily   QUEtiapine  50 mg Oral QHS   rOPINIRole  0.5 mg Oral QHS   sodium chloride flush  10-40 mL Intracatheter Q12H   Continuous Infusions:     Anti-infectives (From admission, onward)    Start     Dose/Rate Route Frequency Ordered Stop   02/09/22 1345  piperacillin-tazobactam (ZOSYN) IVPB 3.375 g        3.375 g 100 mL/hr over 30 Minutes Intravenous  Once 02/09/22 1336 02/09/22 1518              Family  Communication/Anticipated D/C date and plan/Code Status   DVT prophylaxis: enoxaparin (LOVENOX) injection 40 mg Start: 02/09/22 1600     Code Status: Full Code  Family Communication: None Disposition Plan: Plan to discharge to SNF tomorrow  Status is: Inpatient Remains inpatient appropriate because: Hyperkalemia         Subjective:   No complaints.  She feels better today.  Objective:    Vitals:   02/18/22 0345 02/18/22 0346 02/18/22 0803 02/18/22 1241  BP: 106/77  129/81 123/89  Pulse: 100  83 87  Resp: 16     Temp: 98.1 F (36.7 C)  97.7 F (36.5 C)   TempSrc: Oral  Oral   SpO2:   100% 98%  Weight:  52.4 kg    Height:       No data found.   Intake/Output Summary (Last 24 hours) at 02/18/2022 1449 Last data filed at 02/18/2022 1038 Gross per 24 hour  Intake 1283.95 ml  Output 2325 ml  Net -1041.05 ml   Filed Weights   02/09/22 1345 02/13/22 0324 02/18/22 0346  Weight: 54.9 kg 52.4 kg 52.4 kg    Exam:  GEN: NAD SKIN: No rash EYES: EOMI ENT: MMM CV: RRR PULM: CTA B ABD: soft, ND, NT, +BS CNS: AAO x 3, non focal EXT: No edema or tenderness    Data Reviewed:   I have personally reviewed following labs and imaging studies:  Labs: Labs show the following:   Basic Metabolic Panel: Recent Labs  Lab 02/14/22 0922 02/15/22 0426 02/16/22 0443 02/16/22 1713 02/17/22 0414 02/18/22 0558  NA 139 137 134*  --  142 141  K 5.6* 6.0* 6.8*   < > 4.7 5.2*  CL 109 108 105  --  117* 114*  CO2 24 23 21*  --  21* 24  GLUCOSE 196* 422* 505*  --  124* 123*  BUN 20 25* 24*  --  17 20  CREATININE 0.92 1.00 1.04*  --  0.72 0.82  CALCIUM 8.6* 8.5* 8.2*  --  8.4* 8.5*   < > = values in this interval not displayed.   GFR Estimated Creatinine Clearance: 51.1 mL/min (by C-G formula based on SCr of 0.82 mg/dL). Liver Function Tests: No results for input(s): "AST", "ALT", "ALKPHOS", "BILITOT", "PROT", "ALBUMIN" in the last 168 hours. No results for input(s):  "LIPASE", "AMYLASE" in the last 168 hours. No results for input(s): "AMMONIA" in the last 168 hours. Coagulation profile No results for input(s): "INR", "PROTIME" in the last 168 hours.   CBC: Recent Labs  Lab 02/15/22 0426 02/17/22 0414  WBC 7.4 10.8*  NEUTROABS 2.4 4.0  HGB 10.7* 9.0*  HCT 33.7* 28.7*  MCV 88.0 89.4  PLT 281 277   Cardiac Enzymes: No results for input(s): "CKTOTAL", "CKMB", "CKMBINDEX", "TROPONINI" in the last 168 hours. BNP (last 3 results) No results for input(s): "PROBNP" in the last 8760 hours. CBG: Recent Labs  Lab 02/17/22 1710 02/17/22 2044 02/18/22 0426 02/18/22 0805 02/18/22 1241  GLUCAP 270* 247* 116* 91 311*   D-Dimer: No results for input(s): "DDIMER" in the last 72 hours. Hgb A1c: No results for input(s): "HGBA1C" in the last 72 hours.  Lipid Profile: No results for input(s): "CHOL", "HDL", "LDLCALC", "TRIG", "CHOLHDL", "LDLDIRECT" in the last 72 hours. Thyroid function studies: No results for input(s): "TSH", "T4TOTAL", "T3FREE", "THYROIDAB" in the last 72 hours.  Invalid input(s): "FREET3" Anemia work up: No results for input(s): "VITAMINB12", "FOLATE", "FERRITIN", "TIBC", "IRON", "RETICCTPCT" in the last 72 hours. Sepsis Labs: Recent Labs  Lab 02/15/22 0426 02/17/22 0414 02/18/22 0558  WBC 7.4 10.8*  --   LATICACIDVEN 2.6* 1.5 1.2    Microbiology Recent Results (from the past 240 hour(s))  Urine Culture     Status: None   Collection Time: 02/09/22  1:27 PM   Specimen: In/Out Cath Urine  Result Value Ref Range Status   Specimen Description   Final    IN/OUT CATH URINE Performed at [facility], [address], [place], [place] [address]    Special Requests   Final    NONE Performed at [facility], [address], [place], [place] [address]    Culture   Final    NO GROWTH Performed at [facility], [place] [address], [place], [place] [address]    Report Status 02/10/2022 FINAL  Final   Blood Culture (routine x 2)     Status: None   Collection Time: 02/09/22  1:31 PM   Specimen: BLOOD  Result Value Ref Range Status   Specimen Description BLOOD LEFT WRIST  Final   Special Requests   Final    BOTTLES DRAWN AEROBIC AND ANAEROBIC Blood Culture adequate volume   Culture   Final    NO GROWTH 5 DAYS Performed at [facility], [address] [place],  [address]    Report Status 02/14/2022 FINAL  Final  Resp Panel by RT-PCR (Flu A&B, Covid) Anterior Nasal Swab     Status: None   Collection Time: 02/09/22  1:31 PM   Specimen: Anterior Nasal Swab  Result Value Ref Range Status   SARS Coronavirus 2 by RT PCR NEGATIVE NEGATIVE Final    Comment: (NOTE) SARS-CoV-2 target nucleic acids are NOT DETECTED.  The SARS-CoV-2 RNA is generally detectable in upper respiratory specimens during the acute phase of infection. The lowest concentration of SARS-CoV-2 viral copies this assay can detect is 138 copies/mL. A negative result does not preclude SARS-Cov-2 infection and should not be used as the sole basis for treatment or other patient management decisions. A negative result may occur with  improper specimen collection/handling, submission of specimen other than nasopharyngeal swab, presence of viral mutation(s) within the areas targeted by this assay, and inadequate number of viral copies(<138 copies/mL). A negative result must be combined with clinical observations, patient history, and epidemiological information. The expected result is Negative.  Fact Sheet for Patients:  EntrepreneurPulse.com.au  Fact Sheet for Healthcare Providers:  IncredibleEmployment.be  This test is no t yet approved or cleared by the [place] FDA and  has been authorized for detection and/or diagnosis of SARS-CoV-2 by FDA under an Emergency Use Authorization (EUA). This  EUA will remain  in effect (meaning this test can be used) for the  duration of the COVID-19 declaration under Section 564(b)(1) of the Act, 21 U.S.C.section 360bbb-3(b)(1), unless the authorization is terminated  or revoked sooner.       Influenza A by PCR NEGATIVE NEGATIVE Final   Influenza B by PCR NEGATIVE NEGATIVE Final    Comment: (NOTE) The Xpert Xpress SARS-CoV-2/FLU/RSV plus assay is intended as an aid in the diagnosis of influenza from Nasopharyngeal swab specimens and should not be used as a sole basis for treatment. Nasal washings and aspirates are unacceptable for Xpert Xpress SARS-CoV-2/FLU/RSV testing.  Fact Sheet for Patients: EntrepreneurPulse.com.au  Fact Sheet for Healthcare Providers: IncredibleEmployment.be  This test is not yet approved or cleared by the [place] FDA and has been authorized for detection and/or diagnosis of SARS-CoV-2 by FDA under an Emergency Use Authorization (EUA). This EUA will remain in effect (meaning this test can be used) for the duration of the COVID-19 declaration under Section 564(b)(1) of the Act, 21 U.S.C. section 360bbb-3(b)(1), unless the authorization is terminated or revoked.  Performed at [facility], [address] [place], [place] [address]   Blood Culture (routine x 2)     Status: None   Collection Time: 02/09/22  3:30 PM   Specimen: BLOOD RIGHT HAND  Result Value Ref Range Status   Specimen Description BLOOD RIGHT HAND  Final   Special Requests   Final    BOTTLES DRAWN AEROBIC AND ANAEROBIC Blood Culture results may not be optimal due to an inadequate volume of blood received in culture bottles   Culture   Final    NO GROWTH 5 DAYS Performed at [facility], [address] [place], [place] [address]    Report Status 02/14/2022 FINAL  Final    Procedures and diagnostic studies:  No results found.             LOS: 3 days   Quantae Martel  [facility]   Pager on [email or web address]. If 7PM-7AM,  please contact night-coverage at [email or web address]     02/18/2022, 2:49 PM

## 2022-02-18 NOTE — Progress Notes (Signed)
[facility]  PROGRESS NOTE   Subjective:   Patient seen at bedside in NAD.  Objective:  Vital signs: Blood pressure 123/89, pulse 87, temperature 97.7 F (36.5 C), temperature source Oral, resp. rate 16, height 5' (1.524 m), weight 52.4 kg, SpO2 98 %.  Intake/Output Summary (Last 24 hours) at 02/18/2022 1321 Last data filed at 02/18/2022 1038 Gross per 24 hour  Intake 1283.95 ml  Output 2325 ml  Net -1041.05 ml   Filed Weights   02/09/22 1345 02/13/22 0324 02/18/22 0346  Weight: 54.9 kg 52.4 kg 52.4 kg     Physical Exam: General:  No acute distress  Head:  Normocephalic, atraumatic. Moist oral mucosal membranes  Eyes:  Anicteric  Neck:  Supple  Lungs:   Clear to auscultation, normal effort  Heart:  S1S2 no rubs  Abdomen:   Soft, nontender, bowel sounds present  Extremities:  peripheral edema.  Neurologic:  Awake, alert, following commands  Skin:  No lesions  Access:     Basic Metabolic Panel: Recent Labs  Lab 02/14/22 0922 02/15/22 0426 02/16/22 0443 02/16/22 1713 02/17/22 0414 02/18/22 0558  NA 139 137 134*  --  142 141  K 5.6* 6.0* 6.8* 6.0* 4.7 5.2*  CL 109 108 105  --  117* 114*  CO2 24 23 21*  --  21* 24  GLUCOSE 196* 422* 505*  --  124* 123*  BUN 20 25* 24*  --  17 20  CREATININE 0.92 1.00 1.04*  --  0.72 0.82  CALCIUM 8.6* 8.5* 8.2*  --  8.4* 8.5*   GFR: Estimated Creatinine Clearance: 51.1 mL/min (by C-G formula based on SCr of 0.82 mg/dL).  Liver Function Tests: No results for input(s): "AST", "ALT", "ALKPHOS", "BILITOT", "PROT", "ALBUMIN" in the last 168 hours. No results for input(s): "LIPASE", "AMYLASE" in the last 168 hours. No results for input(s): "AMMONIA" in the last 168 hours.  CBC: Recent Labs  Lab 02/15/22 0426 02/17/22 0414  WBC 7.4 10.8*  NEUTROABS 2.4 4.0  HGB 10.7* 9.0*  HCT 33.7* 28.7*  MCV 88.0 89.4  PLT 281 277     HbA1C: Hgb A1c MFr Bld  Date/Time Value Ref Range Status  02/14/2022 09:22 AM 11.0  (H) 4.8 - 5.6 % Final    Comment:    (NOTE)         Prediabetes: 5.7 - 6.4         Diabetes: >6.4         Glycemic control for adults with diabetes: <7.0   10/30/2021 10:04 AM 13.2 (H) 4.8 - 5.6 % Final    Comment:    (NOTE) Pre diabetes:          5.7%-6.4%  Diabetes:              >6.4%  Glycemic control for   <7.0% adults with diabetes     Urinalysis: No results for input(s): "COLORURINE", "LABSPEC", "PHURINE", "GLUCOSEU", "HGBUR", "BILIRUBINUR", "KETONESUR", "PROTEINUR", "UROBILINOGEN", "NITRITE", "LEUKOCYTESUR" in the last 72 hours.  Invalid input(s): "APPERANCEUR"    Imaging: No results found.   Medications:     aspirin EC  81 mg Oral Daily   atorvastatin  40 mg Oral QHS   buPROPion  150 mg Oral Daily   divalproex  500 mg Oral BID   enoxaparin (LOVENOX) injection  40 mg Subcutaneous Q24H   famotidine  20 mg Oral Daily   feeding supplement (GLUCERNA SHAKE)  237 mL Oral TID BM   insulin aspart  0-5 Units Subcutaneous QHS   insulin aspart  0-9 Units Subcutaneous TID WC   insulin aspart  2 Units Subcutaneous TID WC   insulin glargine-yfgn  11 Units Subcutaneous BID   melatonin  2.5 mg Oral QHS   mirtazapine  15 mg Oral QHS   multivitamin with minerals  1 tablet Oral Daily   nicotine  21 mg Transdermal Daily   patiromer  16.8 g Oral Daily   QUEtiapine  50 mg Oral QHS   rOPINIRole  0.5 mg Oral QHS   sodium chloride flush  10-40 mL Intracatheter Q12H    Assessment/ Plan:     62 y.o.  female with past medical history of COPD, diabetes, hypertension, lupus, pancreatic cancer, seizures, RLS, who was admitted to [facility] on 02/09/2022 for Seizure ([place]) [R56.9] Hypoglycemia [E16.2] SIRS (systemic inflammatory response syndrome) ([place]) [R65.10] Hyperkalemia [E87.5]   #1: Hyperkalemia: Hyperkalemia most likely secondary to type IV renal tubular acidosis.  Potassium is worsening again.  Will also start her on small dose of loop diuretic at 20 mg daily orally.. Will  continue the [employer] as needed.   #2: Hypotension: Improved with IV fluids.   #3: Diabetes: Continue insulin protocols.    LOS: [place] 1/7/20241:21 PM

## 2022-02-19 DIAGNOSIS — E875 Hyperkalemia: Secondary | ICD-10-CM | POA: Diagnosis not present

## 2022-02-19 DIAGNOSIS — I5032 Chronic diastolic (congestive) heart failure: Secondary | ICD-10-CM | POA: Diagnosis not present

## 2022-02-19 DIAGNOSIS — F0390 Unspecified dementia without behavioral disturbance: Secondary | ICD-10-CM | POA: Diagnosis present

## 2022-02-19 LAB — BASIC METABOLIC PANEL
Anion gap: 7 (ref 5–15)
BUN: 22 mg/dL (ref 8–23)
CO2: 20 mmol/L — ABNORMAL LOW (ref 22–32)
Calcium: 8.2 mg/dL — ABNORMAL LOW (ref 8.9–10.3)
Chloride: 111 mmol/L (ref 98–111)
Creatinine, Ser: 1.05 mg/dL — ABNORMAL HIGH (ref 0.44–1.00)
GFR, Estimated: >60 mL/min (ref >60)
Glucose, Bld: 320 mg/dL — ABNORMAL HIGH (ref 70–99)
Potassium: 5.7 mmol/L — ABNORMAL HIGH (ref 3.5–5.1)
Sodium: 138 mmol/L (ref 135–145)

## 2022-02-19 LAB — GLUCOSE, CAPILLARY
Glucose-Capillary: 278 mg/dL — ABNORMAL HIGH (ref 70–99)
Glucose-Capillary: 224 mg/dL — ABNORMAL HIGH (ref 70–99)
Glucose-Capillary: 376 mg/dL — ABNORMAL HIGH (ref 70–99)
Glucose-Capillary: 388 mg/dL — ABNORMAL HIGH (ref 70–99)

## 2022-02-19 MED ORDER — NEPRO/CARBSTEADY PO LIQD
237.0000 mL | Freq: Two times a day (BID) | ORAL | Status: DC
  Administered 2022-02-19 – 2022-03-01 (×18): 237 mL via ORAL

## 2022-02-19 MED ORDER — SODIUM BICARBONATE 650 MG PO TABS
1300.0000 mg | ORAL_TABLET | Freq: Two times a day (BID) | ORAL | Status: DC
  Administered 2022-02-19 – 2022-03-01 (×21): 1300 mg via ORAL
  Filled 2022-02-19 (×22): qty 2

## 2022-02-19 NOTE — Progress Notes (Addendum)
[facility]  PROGRESS NOTE   Subjective:   Patient seen resting quietly, breakfast tray at bedside untouched Denies pain or discomfort at this time Remains on room air No lower extremity edema  Objective:  Vital signs: Blood pressure 116/80, pulse 85, temperature 97.8 F (36.6 C), temperature source Oral, resp. rate 16, height 5' (1.524 m), weight 52 kg, SpO2 100 %.  Intake/Output Summary (Last 24 hours) at 02/19/2022 1507 Last data filed at 02/19/2022 1146 Gross per 24 hour  Intake 240 ml  Output 875 ml  Net -635 ml    Filed Weights   02/13/22 0324 02/18/22 0346 02/19/22 0748  Weight: 52.4 kg 52.4 kg 52 kg     Physical Exam: General:  No acute distress  Head:  Normocephalic, atraumatic. Moist oral mucosal membranes  Eyes:  Anicteric  Lungs:   Clear to auscultation, normal effort  Heart:  S1S2 no rubs  Abdomen:   Soft, nontender, bowel sounds present  Extremities: No peripheral edema.  Neurologic:  Awake, alert, following commands  Skin:  No lesions  Access: None    Basic Metabolic Panel: Recent Labs  Lab 02/15/22 0426 02/16/22 0443 02/16/22 1713 02/17/22 0414 02/18/22 0558 02/18/22 1618 02/19/22 0547  NA 137 134*  --  142 141  --  138  K 6.0* 6.8* 6.0* 4.7 5.2* 5.7* 5.7*  CL 108 105  --  117* 114*  --  111  CO2 23 21*  --  21* 24  --  20*  GLUCOSE 422* 505*  --  124* 123*  --  320*  BUN 25* 24*  --  17 20  --  22  CREATININE 1.00 1.04*  --  0.72 0.82  --  1.05*  CALCIUM 8.5* 8.2*  --  8.4* 8.5*  --  8.2*    GFR: Estimated Creatinine Clearance: 39.9 mL/min (A) (by C-G formula based on SCr of 1.05 mg/dL (H)).  Liver Function Tests: No results for input(s): "AST", "ALT", "ALKPHOS", "BILITOT", "PROT", "ALBUMIN" in the last 168 hours. No results for input(s): "LIPASE", "AMYLASE" in the last 168 hours. No results for input(s): "AMMONIA" in the last 168 hours.  CBC: Recent Labs  Lab 02/15/22 0426 02/17/22 0414  WBC 7.4 10.8*  NEUTROABS  2.4 4.0  HGB 10.7* 9.0*  HCT 33.7* 28.7*  MCV 88.0 89.4  PLT 281 277      HbA1C: Hgb A1c MFr Bld  Date/Time Value Ref Range Status  02/14/2022 09:22 AM 11.0 (H) 4.8 - 5.6 % Final    Comment:    (NOTE)         Prediabetes: 5.7 - 6.4         Diabetes: >6.4         Glycemic control for adults with diabetes: <7.0   10/30/2021 10:04 AM 13.2 (H) 4.8 - 5.6 % Final    Comment:    (NOTE) Pre diabetes:          5.7%-6.4%  Diabetes:              >6.4%  Glycemic control for   <7.0% adults with diabetes     Urinalysis: No results for input(s): "COLORURINE", "LABSPEC", "PHURINE", "GLUCOSEU", "HGBUR", "BILIRUBINUR", "KETONESUR", "PROTEINUR", "UROBILINOGEN", "NITRITE", "LEUKOCYTESUR" in the last 72 hours.  Invalid input(s): "APPERANCEUR"    Imaging: No results found.   Medications:     aspirin EC  81 mg Oral Daily   atorvastatin  40 mg Oral QHS   buPROPion  150 mg Oral  Daily   divalproex  500 mg Oral BID   enoxaparin (LOVENOX) injection  40 mg Subcutaneous Q24H   famotidine  20 mg Oral Daily   feeding supplement (NEPRO CARB STEADY)  237 mL Oral BID BM   furosemide  20 mg Oral Daily   insulin aspart  0-5 Units Subcutaneous QHS   insulin aspart  0-9 Units Subcutaneous TID WC   insulin aspart  2 Units Subcutaneous TID WC   insulin glargine-yfgn  11 Units Subcutaneous BID   melatonin  2.5 mg Oral QHS   mirtazapine  15 mg Oral QHS   multivitamin with minerals  1 tablet Oral Daily   nicotine  21 mg Transdermal Daily   QUEtiapine  50 mg Oral QHS   rOPINIRole  0.5 mg Oral QHS   sodium bicarbonate  1,300 mg Oral BID   sodium chloride flush  10-40 mL Intracatheter Q12H   sodium zirconium cyclosilicate  10 g Oral BID    Assessment/ Plan:     62 y.o.  female with past medical history of COPD, diabetes, hypertension, lupus, pancreatic cancer, seizures, RLS, who was admitted to [facility] on 02/09/2022 for Seizure ([place]) [R56.9] Hypoglycemia [E16.2] SIRS (systemic inflammatory  response syndrome) ([place]) [R65.10] Hyperkalemia [E87.5]   #1: Hyperkalemia: Hyperkalemia most likely secondary to type IV renal tubular acidosis.  Potassium remains elevated, continue management with Lokelma.  Will stop Veltassa due to presence of colostomy.  Mechanism of action of Veltassa occurs in distal colon.  Potassium is worsening again.  Continue furosemide at 20 mg daily.  Will also order sodium bicarbonate 1300 mg twice daily. Will change diet to renal diet with carb modified to minimize high potassium foods. Will also change supplements to Nepro.    #2: Hypotension: Blood pressure acceptable at this time.  116/80.   #3: Diabetes mellitis, type 2 Hgb A1c 11.0 on 02/14/22. Not well controlled. Continue insulin protocols.    LOS: [facility] 1/8/20243:07 PM

## 2022-02-19 NOTE — Progress Notes (Signed)
Physical Therapy Treatment Patient Details Name: Brittany [name] MRN: [id number] DOB: [birth year] Today's Date: 02/19/2022   History of Present Illness Brittany [name] is a 62 y.o. 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 difficile, who presents on 02/09/22 with seizure. While in hospital, patient has experienced issues with blood glucose.    PT Comments    Pt seen for PT tx & pt agreeable to tx. Pt is able to complete STS with supervision & cuing for safe hand placement when using RW. Pt ambulates with RW & supervision with max cuing but poor return demo to ambulate within base of AD. When ambulating without AD pt requires min assist & cuing to not reach for furniture for stability (pt reports she furniture walks at home). PT educated pt on recommendation to use RW at d/c. Pt completes STS with supervision for strengthening. Pt demonstrates poor cognition, safety awareness, & overall awareness & this PT recommends pt have supervision upon d/c home.     Recommendations for follow up therapy are one component of a multi-disciplinary discharge planning process, led by the attending physician.  Recommendations may be updated based on patient status, additional functional criteria and insurance authorization.  Follow Up Recommendations  Home health PT     Assistance Recommended at Discharge Frequent or constant Supervision/Assistance  Patient can return home with the following A little help with walking and/or transfers;A little help with bathing/dressing/bathroom;Assistance with cooking/housework;Direct supervision/assist for medications management;Assist for transportation;Help with stairs or ramp for entrance   Equipment Recommendations  None recommended by PT    Recommendations for Other Services        Precautions / Restrictions Precautions Precautions: Fall Precaution Comments: reports 2 falls in last 3 months Restrictions Weight Bearing Restrictions: No     Mobility  Bed Mobility               General bed mobility comments: not tested, pt received & left sitting EOB    Transfers Overall transfer level: Needs assistance Equipment used: Rolling walker (2 wheels), None Transfers: Sit to/from Stand Sit to Stand: Supervision           General transfer comment: cuing for safe hand placement during STS    Ambulation/Gait Ambulation/Gait assistance: Supervision, Min assist Gait Distance (Feet): 200 Feet (100 ft without AD with min assist, rest of time with RW & supervision) Assistive device: Rolling walker (2 wheels), None         General Gait Details: Pt requires cuing to ambulate within base of AD but very poor return demo. Pt with decreased safety awareness overall, and when using RW.   Stairs             Wheelchair Mobility    Modified Rankin (Stroke Patients Only)       Balance Overall balance assessment: Needs assistance Sitting-balance support: Feet supported, No upper extremity supported Sitting balance-Leahy Scale: Good     Standing balance support: No upper extremity supported, During functional activity Standing balance-Leahy Scale: Poor                              Cognition Arousal/Alertness: Awake/alert Behavior During Therapy: WFL for tasks assessed/performed Overall Cognitive Status: No family/caregiver present to determine baseline cognitive functioning  General Comments: Pt is oriented to self but not oriented to time or situation. Demonstrates impaired awareness & safety awareness, decreased ability to follow commands, decreased recall.        Exercises Other Exercises Other Exercises: Pt performs 10x STS from EOB without BUE support with supervision for BLE  strengthening & overall endurance training.    General Comments General comments (skin integrity, edema, etc.): Max HR 111 bpm      Pertinent Vitals/Pain Pain Assessment Pain Assessment: No/denies pain    Home Living                          Prior Function            PT Goals (current goals can now be found in the care plan section) Acute Rehab PT Goals Patient Stated Goal: "Be independent" PT Goal Formulation: With patient Time For Goal Achievement: 03/03/22 Potential to Achieve Goals: Fair Progress towards PT goals: Progressing toward goals    Frequency    Min 2X/week      PT Plan Current plan remains appropriate    Co-evaluation              AM-PAC PT "6 Clicks" Mobility   Outcome Measure  Help needed turning from your back to your side while in a flat bed without using bedrails?: None Help needed moving from lying on your back to sitting on the side of a flat bed without using bedrails?: None Help needed moving to and from a bed to a chair (including a wheelchair)?: A Little Help needed standing up from a chair using your arms (e.g., wheelchair or bedside chair)?: A Little Help needed to walk in hospital room?: A Little Help needed climbing 3-5 steps with a railing? : A Little 6 Click Score: 20    End of Session   Activity Tolerance: Patient tolerated treatment well Patient left: in bed;with call bell/phone within reach;with bed alarm set Nurse Communication: Mobility status PT Visit Diagnosis: Unsteadiness on feet (R26.81);Repeated falls (R29.6);Muscle weakness (generalized) (M62.81);History of falling (Z91.81);Difficulty in walking, not elsewhere classified (R26.2)     Time: 1314-3888 PT Time Calculation (min) (ACUTE ONLY): 9 min  Charges:  $Therapeutic Activity: 8-22 mins                     Lavone Nian, PT, DPT 02/19/22, 4:30 PM  [name] 02/19/2022, 4:28 PM

## 2022-02-19 NOTE — TOC Progression Note (Signed)
Transition of Care [facility]) - Progression Note    Patient Details  Name: ADDI PAK MRN: [id number] Date of Birth: 02-18-1960  [facility] of [facility] CM/SW Contact  Laurena Slimmer, RN Phone Number: 02/19/2022, 12:13 PM  Clinical Narrative:   Confirmed  with MD patient not medically cleared for discharge. Call placed to [facility] to update about status of discharge. Spoke with phone attendant Elizabeth Sauer to advise patient discharge was pended due to hyperkalemia.      Barriers to Discharge: Continued Medical Work up  Expected Discharge Plan and Services                                               Social Determinants of Health (SDOH) Interventions [place]: No Food Insecurity (12/30/2021)  Housing: Low Risk  (12/30/2021)  Transportation Needs: No Transportation Needs (12/30/2021)  Utilities: Not At Risk (12/30/2021)  Tobacco Use: High Risk (12/10/2021)    Readmission Risk Interventions    12/29/2021    1:48 PM 09/22/2021    1:24 PM 03/06/2021    9:13 AM  Readmission Risk Prevention Plan  Transportation Screening Complete Complete Complete  Medication Review Press photographer) Complete Complete Complete  PCP or Specialist appointment within 3-5 days of discharge  Complete Complete  HRI or Home Care Consult Complete Complete   SW Recovery Care/Counseling Consult Complete Complete   Palliative Care Screening Not Applicable Not Applicable Not [place] Not Applicable Complete Not Applicable

## 2022-02-19 NOTE — Progress Notes (Addendum)
Progress Note    Brittany [name]  BHA:[id number] DOB: 1960-03-22  DOA: 02/09/2022 PCP: [name], NP      Brief Narrative:    Medical records reviewed and are as summarized below:  Brittany [name] is a 62 y.o. 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 difficile infection, who presented to the hospital with hypoglycemia and seizures.  Reportedly, glucose levels was under 40s and dropped into the 30s.  He was started on 10% dextrose infusion by EMS and brought to the emergency department.        Assessment/Plan:   Principal Problem:   Seizure ([place]) Active Problems:   Hyperkalemia   Acute metabolic encephalopathy   Hypoglycemia   Hypothermia   Hypotension   Chronic diastolic CHF (congestive heart failure) (HCC)   History of stroke   HLD (hyperlipidemia)   Depression with anxiety   COPD (chronic obstructive pulmonary disease) (HCC)   Tobacco dependence   Elevated lactic acid level   HTN (hypertension)   Hyperglycemia due to type 2 diabetes mellitus ([place])   Dementia without behavioral disturbance (HCC)    Body mass index is 22.38 kg/m.   Seizures prior to admission: This was probably due to hypoglycemia.  No seizures on admission.  Continue Depakote.  Ativan as needed for seizures.    Type II DM with hyperglycemia, recent hypoglycemia, labile glucose levels: Continue insulin glargine 11 units twice daily and NovoLog 2 units 3 times daily with meals.  Use NovoLog as needed for hyperglycemia. Hemoglobin A1c on 02/14/2022 was 11.  Hemoglobin A1c was 13.2 on 10/30/2021.    Refractory, recurrent hyperkalemia: Potassium went up from 5.2-5.7.   Veltassa has been discontinued.  Continue Lasix and Lokelma.  She has also been started on sodium bicarbonate by  nephrologist.  Monitor potassium levels closely.   Hypotension: Improved.  Cardizem and midodrine have been discontinued.. Monitor BP closely.  Adrenal insufficiency was ruled out with ACTH stimulation test.   Acute metabolic encephalopathy, lethargy: Resolved   General weakness: PT recommends home health therapy   Other comorbidities include chronic diastolic CHF, depression, anxiety, hypertension, tobacco use disorder, dementia,   Lactic acidosis, hypothermia: Resolved     Diet Order             Diet renal/carb modified with fluid restriction Diet-HS Snack? Nothing; Room service appropriate? Yes; Fluid consistency: Thin  Diet effective now                            Consultants: None  Procedures: None    Medications:    aspirin EC  81 mg Oral Daily   atorvastatin  40 mg Oral QHS   buPROPion  150 mg Oral Daily   divalproex  500 mg Oral BID   enoxaparin (LOVENOX) injection  40 mg Subcutaneous Q24H   famotidine  20 mg Oral Daily   feeding supplement (NEPRO CARB STEADY)  237 mL Oral BID BM   furosemide  20 mg Oral Daily   insulin aspart  0-5 Units Subcutaneous QHS   insulin aspart  0-9 Units Subcutaneous TID WC   insulin aspart  2 Units Subcutaneous TID WC   insulin glargine-yfgn  11 Units Subcutaneous BID   melatonin  2.5 mg Oral QHS   mirtazapine  15 mg Oral  QHS   multivitamin with minerals  1 tablet Oral Daily   nicotine  21 mg Transdermal Daily   QUEtiapine  50 mg Oral QHS   rOPINIRole  0.5 mg Oral QHS   sodium bicarbonate  1,300 mg Oral BID   sodium chloride flush  10-40 mL Intracatheter Q12H   sodium zirconium cyclosilicate  10 g Oral BID   Continuous Infusions:     Anti-infectives (From admission, onward)    Start     Dose/Rate Route Frequency Ordered Stop   02/09/22 1345  piperacillin-tazobactam (ZOSYN) IVPB 3.375 g        3.375 g 100 mL/hr over 30 Minutes Intravenous  Once 02/09/22 1336 02/09/22 1518               Family Communication/Anticipated D/C date and plan/Code Status   DVT prophylaxis: enoxaparin (LOVENOX) injection 40 mg Start: 02/09/22 1600     Code Status: Full Code  Family Communication: None Disposition Plan: Plan to discharge to SNF tomorrow  Status is: Inpatient Remains inpatient appropriate because: Hyperkalemia         Subjective:   Interval events noted.  She has no complaints.  Objective:    Vitals:   02/19/22 0349 02/19/22 0748 02/19/22 1145 02/19/22 1656  BP: 111/83 109/76 116/80 118/80  Pulse: 90 86 85 87  Resp: '12 18 16 18  '$ Temp: 97.7 F (36.5 C) 97.9 F (36.6 C) 97.8 F (36.6 C) 97.7 F (36.5 C)  TempSrc: Oral Oral Oral Oral  SpO2: 100% 99% 100% 98%  Weight:  52 kg    Height:       No data found.   Intake/Output Summary (Last 24 hours) at 02/19/2022 1658 Last data filed at 02/19/2022 1146 Gross per 24 hour  Intake 240 ml  Output 875 ml  Net -635 ml   Filed Weights   02/13/22 0324 02/18/22 0346 02/19/22 0748  Weight: 52.4 kg 52.4 kg 52 kg    Exam:  GEN: NAD SKIN: No rash EYES: EOMI ENT: MMM CV: RRR PULM: CTA B ABD: soft, ND, NT, +BS, +colostomy CNS: AAO x 3, non focal EXT: No edema or tenderness     Data Reviewed:   I have personally reviewed following labs and imaging studies:  Labs: Labs show the following:   Basic Metabolic Panel: Recent Labs  Lab 02/15/22 0426 02/16/22 0443 02/16/22 1713 02/17/22 0414 02/18/22 0558 02/18/22 1618 02/19/22 0547  NA 137 134*  --  142 141  --  138  K 6.0* 6.8*   < > 4.7 5.2* 5.7* 5.7*  CL 108 105  --  117* 114*  --  111  CO2 23 21*  --  21* 24  --  20*  GLUCOSE 422* 505*  --  124* 123*  --  320*  BUN 25* 24*  --  17 20  --  22  CREATININE 1.00 1.04*  --  0.72 0.82  --  1.05*  CALCIUM 8.5* 8.2*  --  8.4* 8.5*  --  8.2*   < > = values in this interval not displayed.   GFR Estimated Creatinine Clearance: 39.9 mL/min (A) (by C-G formula based on SCr of 1.05  mg/dL (H)). Liver Function Tests: No results for input(s): "AST", "ALT", "ALKPHOS", "BILITOT", "PROT", "ALBUMIN" in the last 168 hours. No results for input(s): "LIPASE", "AMYLASE" in the last 168 hours. No results for input(s): "AMMONIA" in the last 168 hours. Coagulation profile No results for input(s): "INR", "PROTIME" in the  last 168 hours.   CBC: Recent Labs  Lab 02/15/22 0426 02/17/22 0414  WBC 7.4 10.8*  NEUTROABS 2.4 4.0  HGB 10.7* 9.0*  HCT 33.7* 28.7*  MCV 88.0 89.4  PLT 281 277   Cardiac Enzymes: No results for input(s): "CKTOTAL", "CKMB", "CKMBINDEX", "TROPONINI" in the last 168 hours. BNP (last 3 results) No results for input(s): "PROBNP" in the last 8760 hours. CBG: Recent Labs  Lab 02/18/22 1241 02/18/22 1731 02/18/22 2022 02/19/22 0747 02/19/22 1150  GLUCAP 311* 194* 147* 278* 224*   D-Dimer: No results for input(s): "DDIMER" in the last 72 hours. Hgb A1c: No results for input(s): "HGBA1C" in the last 72 hours.  Lipid Profile: No results for input(s): "CHOL", "HDL", "LDLCALC", "TRIG", "CHOLHDL", "LDLDIRECT" in the last 72 hours. Thyroid function studies: No results for input(s): "TSH", "T4TOTAL", "T3FREE", "THYROIDAB" in the last 72 hours.  Invalid input(s): "FREET3" Anemia work up: No results for input(s): "VITAMINB12", "FOLATE", "FERRITIN", "TIBC", "IRON", "RETICCTPCT" in the last 72 hours. Sepsis Labs: Recent Labs  Lab 02/15/22 0426 02/17/22 0414 02/18/22 0558  WBC 7.4 10.8*  --   LATICACIDVEN 2.6* 1.5 1.2    Microbiology No results found for this or any previous visit (from the past 240 hour(s)).   Procedures and diagnostic studies:  No results found.             LOS: 4 days   Kamoni Depree  [facility]   Pager on [email or web address]. If 7PM-7AM, please contact night-coverage at [email or web address]     02/19/2022, 4:58 PM

## 2022-02-19 NOTE — Inpatient Diabetes Management (Signed)
Inpatient Diabetes Program Recommendations  AACE/ADA: New Consensus Statement on Inpatient Glycemic Control (2015)  Target Ranges:  Prepandial:   less than 140 mg/dL      Peak postprandial:   less than 180 mg/dL (1-2 hours)      Critically ill patients:  140 - 180 mg/dL   Lab Results  Component Value Date   GLUCAP 278 (H) 02/19/2022   HGBA1C 11.0 (H) 02/14/2022    Review of Glycemic Control  Latest Reference Range & Units 02/18/22 12:41 02/18/22 17:31 02/18/22 20:22 02/19/22 07:47  Glucose-Capillary 70 - 99 mg/dL 311 (H) 194 (H) 147 (H) 278 (H)  Diabetes history: DM1 (does NOT make any insulin; requires basal, correction, and carb coverage insulin) Outpatient Diabetes medications: Lantus 18 units daily, Novolog 3 units TID with meals, Metformin 1000 mg BID Current orders for Inpatient glycemic control: Semglee 11 units BID, Novolog 0-9 units TID with meals, Novolog 0-5 units QHS, Novolog 2 units TID with meals   Inpatient Diabetes Program Recommendations:     Consider increasing meal coverage to Novolog 4 units TID with meals if patient eats at least 50% of meals.  If patient is consuming carbohydrates during the night, please consider ordering Novolog 2 units as needed for snack of at least 20 grams of carbs.  Thanks, Bronson Curb, MSN, RNC-OB Diabetes Coordinator [phone] (8a-5p)

## 2022-02-20 DIAGNOSIS — Z794 Long term (current) use of insulin: Secondary | ICD-10-CM | POA: Diagnosis not present

## 2022-02-20 DIAGNOSIS — E875 Hyperkalemia: Secondary | ICD-10-CM | POA: Diagnosis not present

## 2022-02-20 DIAGNOSIS — E162 Hypoglycemia, unspecified: Secondary | ICD-10-CM | POA: Diagnosis not present

## 2022-02-20 DIAGNOSIS — E1165 Type 2 diabetes mellitus with hyperglycemia: Secondary | ICD-10-CM | POA: Diagnosis not present

## 2022-02-20 LAB — MRSA NEXT GEN BY PCR, NASAL: MRSA by PCR Next Gen: NOT DETECTED

## 2022-02-20 LAB — CBC WITH DIFFERENTIAL/PLATELET
Abs Immature Granulocytes: 0.08 10*3/uL — ABNORMAL HIGH (ref 0.00–0.07)
Basophils Absolute: 0 10*3/uL (ref 0.0–0.1)
Basophils Relative: 0 %
Eosinophils Absolute: 0.1 10*3/uL (ref 0.0–0.5)
Eosinophils Relative: 1 %
HCT: 34.1 % — ABNORMAL LOW (ref 36.0–46.0)
Hemoglobin: 10.6 g/dL — ABNORMAL LOW (ref 12.0–15.0)
Immature Granulocytes: 1 %
Lymphocytes Relative: 53 %
Lymphs Abs: 5.1 10*3/uL — ABNORMAL HIGH (ref 0.7–4.0)
MCH: 28.1 pg (ref 26.0–34.0)
MCHC: 31.1 g/dL (ref 30.0–36.0)
MCV: 90.5 fL (ref 80.0–100.0)
Monocytes Absolute: 0.8 10*3/uL (ref 0.1–1.0)
Monocytes Relative: 8 %
Neutro Abs: 3.5 10*3/uL (ref 1.7–7.7)
Neutrophils Relative %: 37 %
Platelets: 312 10*3/uL (ref 150–400)
RBC: 3.77 MIL/uL — ABNORMAL LOW (ref 3.87–5.11)
RDW: 22.8 % — ABNORMAL HIGH (ref 11.5–15.5)
Smear Review: NORMAL
WBC: 9.6 10*3/uL (ref 4.0–10.5)
nRBC: 0.9 % — ABNORMAL HIGH (ref 0.0–0.2)

## 2022-02-20 LAB — BASIC METABOLIC PANEL
Anion gap: 5 (ref 5–15)
BUN: 28 mg/dL — ABNORMAL HIGH (ref 8–23)
CO2: 22 mmol/L (ref 22–32)
Calcium: 8.6 mg/dL — ABNORMAL LOW (ref 8.9–10.3)
Chloride: 110 mmol/L (ref 98–111)
Creatinine, Ser: 1.03 mg/dL — ABNORMAL HIGH (ref 0.44–1.00)
GFR, Estimated: >60 mL/min (ref >60)
Glucose, Bld: 214 mg/dL — ABNORMAL HIGH (ref 70–99)
Potassium: 6 mmol/L — ABNORMAL HIGH (ref 3.5–5.1)
Sodium: 137 mmol/L (ref 135–145)

## 2022-02-20 LAB — GLUCOSE, CAPILLARY
Glucose-Capillary: 100 mg/dL — ABNORMAL HIGH (ref 70–99)
Glucose-Capillary: 66 mg/dL — ABNORMAL LOW (ref 70–99)
Glucose-Capillary: 253 mg/dL — ABNORMAL HIGH (ref 70–99)
Glucose-Capillary: 322 mg/dL — ABNORMAL HIGH (ref 70–99)
Glucose-Capillary: 163 mg/dL — ABNORMAL HIGH (ref 70–99)

## 2022-02-20 MED ORDER — SODIUM ZIRCONIUM CYCLOSILICATE 10 G PO PACK
10.0000 g | PACK | Freq: Three times a day (TID) | ORAL | Status: DC
  Administered 2022-02-20 – 2022-02-21 (×3): 10 g via ORAL
  Filled 2022-02-20 (×3): qty 1

## 2022-02-20 MED ORDER — ORAL CARE MOUTH RINSE: 15.0000 mL | OROMUCOSAL | Status: DC | PRN

## 2022-02-20 MED ORDER — ALBUTEROL SULFATE (2.5 MG/3ML) 0.083% IN NEBU
10.0000 mg | INHALATION_SOLUTION | Freq: Once | RESPIRATORY_TRACT | Status: AC
  Administered 2022-02-20: 10 mg via RESPIRATORY_TRACT
  Filled 2022-02-20: qty 12

## 2022-02-20 MED ORDER — CALCIUM GLUCONATE-NACL 1-0.675 GM/50ML-% IV SOLN
1.0000 g | Freq: Once | INTRAVENOUS | Status: AC
  Administered 2022-02-20: 1000 mg via INTRAVENOUS
  Filled 2022-02-20: qty 50

## 2022-02-20 NOTE — Progress Notes (Signed)
Occupational Therapy Treatment Patient Details Name: Brittany [name] MRN: [id number] DOB: Oct 29, 1960 Today's Date: 02/20/2022   History of present illness Brittany [name] is a 62 y.o. 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 difficile, who presents on 02/09/22 with seizure. While in hospital, patient has experienced issues with blood glucose.   OT comments  Brittany [name] demonstrated improved balance this day, able to ambulate in room and to bathroom, perform grooming standing at sink, perform toileting, all without LOB. Pt was impulsive with some movements, however, and needed occasional cueing to slow down and stay closer to RW. Pt able to perform UB and LB dressing in sitting with SUPV. Pt left if bed, call bell within reach, bed alarm activated, and RN in room to attend to colostomy bag, as pt had dislodged it while ambulating. Recommend HHOT post DC.   Recommendations for follow up therapy are one component of a multi-disciplinary discharge planning process, led by the attending physician.  Recommendations may be updated based on patient status, additional functional criteria and insurance authorization.    Follow Up Recommendations  Home health OT     Assistance Recommended at Discharge Intermittent Supervision/Assistance  Patient can return home with the following  A little help with walking and/or transfers;A little help with bathing/dressing/bathroom;Assistance with cooking/housework;Assist for transportation   Equipment Recommendations       Recommendations for Other Services      Precautions / Restrictions Precautions Precautions: Fall Precaution Comments: reports 2 falls in last 3 months Restrictions Weight Bearing Restrictions: No       Mobility Bed Mobility Overal bed mobility: Needs  Assistance Bed Mobility: Supine to Sit, Sit to Supine     Supine to sit: Supervision Sit to supine: Supervision   General bed mobility comments: increased time and efforts    Transfers Overall transfer level: Needs assistance Equipment used: Rolling walker (2 wheels) Transfers: Sit to/from Stand Sit to Stand: Supervision           General transfer comment: cuing for safe hand placement during STS     Balance Overall balance assessment: Needs assistance Sitting-balance support: Feet supported, No upper extremity supported Sitting balance-Leahy Scale: Good     Standing balance support: Bilateral upper extremity supported, During functional activity Standing balance-Leahy Scale: Fair                             ADL either performed or assessed with clinical judgement   ADL Overall ADL's : Needs assistance/impaired Eating/Feeding: Independent   Grooming: Standing;Wash/dry face;Wash/dry hands;Brushing hair Grooming Details (indicate cue type and reason): standing at sink, able to reach beyond BOS, no UE support required         Upper Body Dressing : Modified independent;Sitting   Lower Body Dressing: Supervision/safety;Sitting/lateral leans   Toilet Transfer: Copy Details (indicate cue type and reason): Pt impulsive with movement Toileting- Clothing Manipulation and Hygiene: Supervision/safety       Functional mobility during ADLs: Min guard      Extremity/Trunk Assessment Upper Extremity Assessment Upper Extremity Assessment: Generalized weakness   Lower Extremity Assessment Lower Extremity Assessment: Generalized weakness        Vision       Perception     Praxis      Cognition Arousal/Alertness: Awake/alert Behavior During Therapy: [facility]  for tasks assessed/performed Overall Cognitive Status: No family/caregiver present to determine baseline cognitive functioning                                           Exercises Other Exercises Other Exercises: Educ re: falls prevention, safe use of RW, moving more slowly for safety    Shoulder Instructions       General Comments      Pertinent Vitals/ Pain       Pain Assessment Pain Assessment: No/denies pain  Home Living                                          Prior Functioning/Environment              Frequency  Min 2X/week        Progress Toward Goals  OT Goals(current goals can now be found in the care plan section)  Progress towards OT goals: Progressing toward goals  Acute Rehab OT Goals OT Goal Formulation: With patient Time For Goal Achievement: 03/03/22 Potential to Achieve Goals: Good  Plan Discharge plan remains appropriate;Frequency remains appropriate    Co-evaluation                 AM-PAC OT "6 Clicks" Daily Activity     Outcome Measure   Help from another person eating meals?: None Help from another person taking care of personal grooming?: None Help from another person toileting, which includes using toliet, bedpan, or urinal?: A Little Help from another person bathing (including washing, rinsing, drying)?: A Little Help from another person to put on and taking off regular upper body clothing?: None Help from another person to put on and taking off regular lower body clothing?: None 6 Click Score: 22    End of Session Equipment Utilized During Treatment: Rolling walker (2 wheels)  OT Visit Diagnosis: Unsteadiness on feet (R26.81);Muscle weakness (generalized) (M62.81)   Activity Tolerance Patient tolerated treatment well   Patient Left in bed;with call bell/phone within reach;with bed alarm set;with nursing/sitter in room   Nurse Communication Mobility status;Other (comment) (need to place new ostomy bag)        Time: 3893-7342 OT Time Calculation (min): 25 min  Charges: OT General Charges $OT Visit: 1 Visit OT Treatments $Self Care/Home  Management : 23-37 mins Josiah Lobo, PhD, MS, OTR/L 02/20/22, 4:10 PM

## 2022-02-20 NOTE — Progress Notes (Signed)
[facility]  PROGRESS NOTE   Subjective:   Patient seen laying to exclude of bed, partially completed breakfast tray at bedside Alert but drowsy No lower extremity edema Remains on room air  Objective:  Vital signs: Blood pressure 118/87, pulse 83, temperature 97.9 F (36.6 C), resp. rate (!) 21, height 5' (1.524 m), weight 52 kg, SpO2 98 %.  Intake/Output Summary (Last 24 hours) at 02/20/2022 1105 Last data filed at 02/20/2022 1018 Gross per 24 hour  Intake 480 ml  Output 2275 ml  Net -1795 ml    Filed Weights   02/13/22 0324 02/18/22 0346 02/19/22 0748  Weight: 52.4 kg 52.4 kg 52 kg     Physical Exam: General:  No acute distress  Head:  Normocephalic, atraumatic. Moist oral mucosal membranes  Eyes:  Anicteric  Lungs:   Clear to auscultation, normal effort  Heart:  S1S2 no rubs  Abdomen:   Soft, nontender, bowel sounds present, ostomy present  Extremities: No peripheral edema.  Neurologic:  Awake, alert, following commands  Skin:  No lesions  Access: None    Basic Metabolic Panel: Recent Labs  Lab 02/16/22 0443 02/16/22 1713 02/17/22 0414 02/18/22 0558 02/18/22 1618 02/19/22 0547 02/20/22 0853  NA 134*  --  142 141  --  138 137  K 6.8*   < > 4.7 5.2* 5.7* 5.7* 6.0*  CL 105  --  117* 114*  --  111 110  CO2 21*  --  21* 24  --  20* 22  GLUCOSE 505*  --  124* 123*  --  320* 214*  BUN 24*  --  17 20  --  22 28*  CREATININE 1.04*  --  0.72 0.82  --  1.05* 1.03*  CALCIUM 8.2*  --  8.4* 8.5*  --  8.2* 8.6*   < > = values in this interval not displayed.    GFR: Estimated Creatinine Clearance: 40.7 mL/min (A) (by C-G formula based on SCr of 1.03 mg/dL (H)).  Liver Function Tests: No results for input(s): "AST", "ALT", "ALKPHOS", "BILITOT", "PROT", "ALBUMIN" in the last 168 hours. No results for input(s): "LIPASE", "AMYLASE" in the last 168 hours. No results for input(s): "AMMONIA" in the last 168 hours.  CBC: Recent Labs  Lab 02/15/22 0426  02/17/22 0414 02/20/22 0853  WBC 7.4 10.8* 9.6  NEUTROABS 2.4 4.0 3.5  HGB 10.7* 9.0* 10.6*  HCT 33.7* 28.7* 34.1*  MCV 88.0 89.4 90.5  PLT 281 277 312      HbA1C: Hgb A1c MFr Bld  Date/Time Value Ref Range Status  02/14/2022 09:22 AM 11.0 (H) 4.8 - 5.6 % Final    Comment:    (NOTE)         Prediabetes: 5.7 - 6.4         Diabetes: >6.4         Glycemic control for adults with diabetes: <7.0   10/30/2021 10:04 AM 13.2 (H) 4.8 - 5.6 % Final    Comment:    (NOTE) Pre diabetes:          5.7%-6.4%  Diabetes:              >6.4%  Glycemic control for   <7.0% adults with diabetes     Urinalysis: No results for input(s): "COLORURINE", "LABSPEC", "PHURINE", "GLUCOSEU", "HGBUR", "BILIRUBINUR", "KETONESUR", "PROTEINUR", "UROBILINOGEN", "NITRITE", "LEUKOCYTESUR" in the last 72 hours.  Invalid input(s): "APPERANCEUR"    Imaging: No results found.   Medications:    calcium  gluconate in NaCl 1,000 mg (02/20/22 1026)    aspirin EC  81 mg Oral Daily   atorvastatin  40 mg Oral QHS   buPROPion  150 mg Oral Daily   divalproex  500 mg Oral BID   enoxaparin (LOVENOX) injection  40 mg Subcutaneous Q24H   famotidine  20 mg Oral Daily   feeding supplement (NEPRO CARB STEADY)  237 mL Oral BID BM   furosemide  20 mg Oral Daily   insulin aspart  0-5 Units Subcutaneous QHS   insulin aspart  0-9 Units Subcutaneous TID WC   insulin aspart  2 Units Subcutaneous TID WC   insulin glargine-yfgn  11 Units Subcutaneous BID   melatonin  2.5 mg Oral QHS   mirtazapine  15 mg Oral QHS   multivitamin with minerals  1 tablet Oral Daily   nicotine  21 mg Transdermal Daily   QUEtiapine  50 mg Oral QHS   rOPINIRole  0.5 mg Oral QHS   sodium bicarbonate  1,300 mg Oral BID   sodium chloride flush  10-40 mL Intracatheter Q12H   sodium zirconium cyclosilicate  10 g Oral BID    Assessment/ Plan:     62 y.o.  female with past medical history of COPD, diabetes, hypertension, lupus, pancreatic  cancer, seizures, RLS, who was admitted to [facility] on 02/09/2022 for Seizure ([place]) [R56.9] Hypoglycemia [E16.2] SIRS (systemic inflammatory response syndrome) ([place]) [R65.10] Hyperkalemia [E87.5]   #1: Hyperkalemia: Hyperkalemia most likely secondary to type IV renal tubular acidosis.  Potassium remains elevated, continue management with Lokelma.  Veltassa less effective due to colon absorption.  Potassium elevated, 6.0.  Remains on daily furosemide.  Continue low potassium diet.   #2: Hypotension: Blood pressure stable.   #3: Diabetes mellitis, type 2 Hgb A1c 11.0 on 02/14/22.  Glucose levels continue to fluctuate.  Primary team to manage sliding scale insulin.   LOS: [facility] 1/9/202411:05 AM

## 2022-02-20 NOTE — Progress Notes (Signed)
Progress Note    Brittany [name]  OEV:[id number] DOB: Jan 14, 1961  DOA: 02/09/2022 PCP: Brittany Cairo, NP      Brief Narrative:    Medical records reviewed and are as summarized below:  Brittany [name] is a 62 y.o. 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 difficile infection, who presented to the hospital with hypoglycemia and seizures.  Reportedly, glucose levels was under 40s and dropped into the 30s.  He was started on 10% dextrose infusion by EMS and brought to the emergency department.        Assessment/Plan:   Principal Problem:   Seizure ([place]) Active Problems:   Hyperkalemia   Acute metabolic encephalopathy   Hypoglycemia   Hypothermia   Hypotension   Chronic diastolic CHF (congestive heart failure) (HCC)   History of stroke   HLD (hyperlipidemia)   Depression with anxiety   COPD (chronic obstructive pulmonary disease) (HCC)   Tobacco dependence   Elevated lactic acid level   HTN (hypertension)   Hyperglycemia due to type 2 diabetes mellitus ([place])   Dementia without behavioral disturbance (HCC)    Body mass index is 22.38 kg/m.   Seizures prior to admission: This was probably due to hypoglycemia.  No seizures on admission.  Continue Depakote.  Ativan as needed for seizures.    Type II DM with hyperglycemia, recent hypoglycemia, labile glucose levels: Glucose level dropped to 66 on the morning of 02/20/2022.  Continue current dose of insulin regimen, glargine 11 units twice daily, NovoLog 2 units 3 times daily.  Use NovoLog as needed for hyperglycemia.   Hemoglobin A1c on 02/14/2022 was 11.  Hemoglobin A1c was 13.2 on 10/30/2021.    Refractory, recurrent hyperkalemia: Potassium still trending upward, up from 5.7-6.0.  She was given high-dose  albuterol via nebulizer and IV calcium gluconate today.  Avoiding IV insulin because of recent hypoglycemia. Continue Lasix, Lokelma and sodium bicarbonate.    Hypotension: Improved.  Cardizem and midodrine have been discontinued.. Monitor BP closely.  Adrenal insufficiency was ruled out with ACTH stimulation test.   Acute metabolic encephalopathy, lethargy: Resolved   General weakness: PT recommends home health therapy   Other comorbidities include chronic diastolic CHF, depression, anxiety, hypertension, tobacco use disorder, dementia,   Lactic acidosis, hypothermia: Resolved     Diet Order             Diet renal/carb modified with fluid restriction Diet-HS Snack? Nothing; Room service appropriate? Yes; Fluid consistency: Thin  Diet effective now                            Consultants: Nephrologist  Procedures: None    Medications:    aspirin EC  81 mg Oral Daily   atorvastatin  40 mg Oral QHS   buPROPion  150 mg Oral Daily   divalproex  500 mg Oral BID   enoxaparin (LOVENOX) injection  40 mg Subcutaneous Q24H   famotidine  20 mg Oral Daily   feeding supplement (NEPRO CARB STEADY)  237 mL Oral BID BM   furosemide  20 mg Oral Daily   insulin aspart  0-5 Units Subcutaneous QHS   insulin aspart  0-9 Units Subcutaneous TID WC   insulin aspart  2 Units Subcutaneous TID WC   insulin glargine-yfgn  11 Units  Subcutaneous BID   melatonin  2.5 mg Oral QHS   mirtazapine  15 mg Oral QHS   multivitamin with minerals  1 tablet Oral Daily   nicotine  21 mg Transdermal Daily   QUEtiapine  50 mg Oral QHS   rOPINIRole  0.5 mg Oral QHS   sodium bicarbonate  1,300 mg Oral BID   sodium chloride flush  10-40 mL Intracatheter Q12H   sodium zirconium cyclosilicate  10 g Oral BID   Continuous Infusions:     Anti-infectives (From admission, onward)    Start     Dose/Rate Route Frequency Ordered Stop   02/09/22 1345  piperacillin-tazobactam (ZOSYN) IVPB 3.375  g        3.375 g 100 mL/hr over 30 Minutes Intravenous  Once 02/09/22 1336 02/09/22 1518              Family Communication/Anticipated D/C date and plan/Code Status   DVT prophylaxis: enoxaparin (LOVENOX) injection 40 mg Start: 02/09/22 1600     Code Status: Full Code  Family Communication: None Disposition Plan: Plan to discharge to SNF when potassium stabilizes.  Status is: Inpatient Remains inpatient appropriate because: Hyperkalemia         Subjective:   She feels well and no complaints.  She was asked about discharge plans because she is worried people will take away her belongings at the nursing home.  Objective:    Vitals:   02/20/22 0349 02/20/22 0740 02/20/22 1000 02/20/22 1213  BP: 96/71 118/87  108/88  Pulse: 99 83  (!) 127  Resp: 18 17 (!) 21 20  Temp:  97.9 F (36.6 C)  97.7 F (36.5 C)  TempSrc:    Oral  SpO2: 98% 98%  98%  Weight:      Height:       No data found.   Intake/Output Summary (Last 24 hours) at 02/20/2022 1336 Last data filed at 02/20/2022 1216 Gross per 24 hour  Intake 480 ml  Output 2050 ml  Net -1570 ml   Filed Weights   02/13/22 0324 02/18/22 0346 02/19/22 0748  Weight: 52.4 kg 52.4 kg 52 kg    Exam:  GEN: NAD SKIN: No rash EYES: EOMI ENT: MMM CV: RRR PULM: CTA B ABD: soft, ND, NT, +BS, + colostomy CNS: AAO x 3, non focal EXT: No edema or tenderness    Data Reviewed:   I have personally reviewed following labs and imaging studies:  Labs: Labs show the following:   Basic Metabolic Panel: Recent Labs  Lab 02/16/22 0443 02/16/22 1713 02/17/22 0414 02/18/22 0558 02/18/22 1618 02/19/22 0547 02/20/22 0853  NA 134*  --  142 141  --  138 137  K 6.8*   < > 4.7 5.2*   < > 5.7* 6.0*  CL 105  --  117* 114*  --  111 110  CO2 21*  --  21* 24  --  20* 22  GLUCOSE 505*  --  124* 123*  --  320* 214*  BUN 24*  --  17 20  --  22 28*  CREATININE 1.04*  --  0.72 0.82  --  1.05* 1.03*  CALCIUM 8.2*  --   8.4* 8.5*  --  8.2* 8.6*   < > = values in this interval not displayed.   GFR Estimated Creatinine Clearance: 40.7 mL/min (A) (by C-G formula based on SCr of 1.03 mg/dL (H)). Liver Function Tests: No results for input(s): "AST", "ALT", "ALKPHOS", "BILITOT", "PROT", "ALBUMIN"  in the last 168 hours. No results for input(s): "LIPASE", "AMYLASE" in the last 168 hours. No results for input(s): "AMMONIA" in the last 168 hours. Coagulation profile No results for input(s): "INR", "PROTIME" in the last 168 hours.   CBC: Recent Labs  Lab 02/15/22 0426 02/17/22 0414 02/20/22 0853  WBC 7.4 10.8* 9.6  NEUTROABS 2.4 4.0 3.5  HGB 10.7* 9.0* 10.6*  HCT 33.7* 28.7* 34.1*  MCV 88.0 89.4 90.5  PLT 281 277 312   Cardiac Enzymes: No results for input(s): "CKTOTAL", "CKMB", "CKMBINDEX", "TROPONINI" in the last 168 hours. BNP (last 3 results) No results for input(s): "PROBNP" in the last 8760 hours. CBG: Recent Labs  Lab 02/19/22 1714 02/19/22 2025 02/20/22 0734 02/20/22 0815 02/20/22 1208  GLUCAP 376* 388* 66* 100* 253*   D-Dimer: No results for input(s): "DDIMER" in the last 72 hours. Hgb A1c: No results for input(s): "HGBA1C" in the last 72 hours.  Lipid Profile: No results for input(s): "CHOL", "HDL", "LDLCALC", "TRIG", "CHOLHDL", "LDLDIRECT" in the last 72 hours. Thyroid function studies: No results for input(s): "TSH", "T4TOTAL", "T3FREE", "THYROIDAB" in the last 72 hours.  Invalid input(s): "FREET3" Anemia work up: No results for input(s): "VITAMINB12", "FOLATE", "FERRITIN", "TIBC", "IRON", "RETICCTPCT" in the last 72 hours. Sepsis Labs: Recent Labs  Lab 02/15/22 0426 02/17/22 0414 02/18/22 0558 02/20/22 0853  WBC 7.4 10.8*  --  9.6  LATICACIDVEN 2.6* 1.5 1.2  --     Microbiology No results found for this or any previous visit (from the past 240 hour(s)).   Procedures and diagnostic studies:  No results found.             LOS: 5 days   Isadore Palecek  [facility]   Pager on [email or web address]. If 7PM-7AM, please contact night-coverage at [email or web address]     02/20/2022, 1:36 PM

## 2022-02-20 NOTE — Progress Notes (Signed)
Inpatient Diabetes Program Recommendations  AACE/ADA: New Consensus Statement on Inpatient Glycemic Control (2015)  Target Ranges:  Prepandial:   less than 140 mg/dL      Peak postprandial:   less than 180 mg/dL (1-2 hours)      Critically ill patients:  140 - 180 mg/dL   Lab Results  Component Value Date   GLUCAP 100 (H) 02/20/2022   HGBA1C 11.0 (H) 02/14/2022    Review of Glycemic Control  Latest Reference Range & Units 02/19/22 07:47 02/19/22 11:50 02/19/22 17:14 02/19/22 20:25 02/20/22 07:34  Glucose-Capillary 70 - 99 mg/dL 278 (H) 224 (H) 376 (H) 388 (H) 66 (L)  Diabetes history: DM1 (does NOT make any insulin; requires basal, correction, and carb coverage insulin) Outpatient Diabetes medications: Lantus 18 units daily, Novolog 3 units TID with meals, Metformin 1000 mg BID Current orders for Inpatient glycemic control: Semglee 11 units BID, Novolog 0-9 units TID with meals, Novolog 0-5 units QHS, Novolog 2 units TID with meals  Inpatient Diabetes Program Recommendations:    Note that patient received 9 units Novolog (1914) plus 5 units Novolog (2034) back to back on 02/19/22.  Therefore would not make any changes to insulins today.  Need to make sure that there is at least 3-4 hours between rapid acting insulin doses.    Thanks,  Adah Perl, RN, BC-ADM Inpatient Diabetes Coordinator Pager [phone]  (8a-5p)

## 2022-02-20 NOTE — Plan of Care (Signed)
Patient remains in Cardiac PCU. Med-surg level of care. No supplemental O2 or infusions. Patient has a stable colostomy to LLQ. Confusion (per flow-sheet). Fall prevention measures in place. Patient had been admitted from long-term care facility but not MRSA PCR was completed; this protocol initiated by this RN.   Problem: Education: Goal: Ability to describe self-care measures that may prevent or decrease complications (Diabetes Survival Skills Education) will improve Outcome: Progressing Goal: Individualized Educational Video(s) Outcome: Progressing   Problem: Coping: Goal: Ability to adjust to condition or change in health will improve Outcome: Progressing   Problem: Fluid Volume: Goal: Ability to maintain a balanced intake and output will improve Outcome: Progressing   Problem: Health Behavior/Discharge Planning: Goal: Ability to identify and utilize available resources and services will improve Outcome: Progressing Goal: Ability to manage health-related needs will improve Outcome: Progressing   Problem: Metabolic: Goal: Ability to maintain appropriate glucose levels will improve Outcome: Progressing   Problem: Nutritional: Goal: Maintenance of adequate nutrition will improve Outcome: Progressing Goal: Progress toward achieving an optimal weight will improve Outcome: Progressing   Problem: Skin Integrity: Goal: Risk for impaired skin integrity will decrease Outcome: Progressing   Problem: Tissue Perfusion: Goal: Adequacy of tissue perfusion will improve Outcome: Progressing   Problem: Education: Goal: Knowledge of General Education information will improve Description: Including pain rating scale, medication(s)/side effects and non-pharmacologic comfort measures Outcome: Progressing   Problem: Health Behavior/Discharge Planning: Goal: Ability to manage health-related needs will improve Outcome: Progressing   Problem: Clinical Measurements: Goal: Ability to  maintain clinical measurements within normal limits will improve Outcome: Progressing Goal: Will remain free from infection Outcome: Progressing Goal: Diagnostic test results will improve Outcome: Progressing Goal: Respiratory complications will improve Outcome: Progressing Goal: Cardiovascular complication will be avoided Outcome: Progressing   Problem: Activity: Goal: Risk for activity intolerance will decrease Outcome: Progressing   Problem: Nutrition: Goal: Adequate nutrition will be maintained Outcome: Progressing   Problem: Coping: Goal: Level of anxiety will decrease Outcome: Progressing   Problem: Elimination: Goal: Will not experience complications related to bowel motility Outcome: Progressing Goal: Will not experience complications related to urinary retention Outcome: Progressing   Problem: Pain Managment: Goal: General experience of comfort will improve Outcome: Progressing   Problem: Safety: Goal: Ability to remain free from injury will improve Outcome: Progressing   Problem: Skin Integrity: Goal: Risk for impaired skin integrity will decrease Outcome: Progressing

## 2022-02-21 DIAGNOSIS — R569 Unspecified convulsions: Secondary | ICD-10-CM | POA: Diagnosis not present

## 2022-02-21 LAB — GLUCOSE, CAPILLARY
Glucose-Capillary: 173 mg/dL — ABNORMAL HIGH (ref 70–99)
Glucose-Capillary: 178 mg/dL — ABNORMAL HIGH (ref 70–99)
Glucose-Capillary: 31 mg/dL — CL (ref 70–99)
Glucose-Capillary: 32 mg/dL — CL (ref 70–99)
Glucose-Capillary: 172 mg/dL — ABNORMAL HIGH (ref 70–99)
Glucose-Capillary: 206 mg/dL — ABNORMAL HIGH (ref 70–99)

## 2022-02-21 LAB — BASIC METABOLIC PANEL
Anion gap: 7 (ref 5–15)
BUN: 29 mg/dL — ABNORMAL HIGH (ref 8–23)
CO2: 22 mmol/L (ref 22–32)
Calcium: 8.3 mg/dL — ABNORMAL LOW (ref 8.9–10.3)
Chloride: 110 mmol/L (ref 98–111)
Creatinine, Ser: 0.96 mg/dL (ref 0.44–1.00)
GFR, Estimated: >60 mL/min (ref >60)
Glucose, Bld: 203 mg/dL — ABNORMAL HIGH (ref 70–99)
Potassium: 4.8 mmol/L (ref 3.5–5.1)
Sodium: 139 mmol/L (ref 135–145)

## 2022-02-21 MED ORDER — LORAZEPAM 2 MG/ML IJ SOLN
1.0000 mg | Freq: Once | INTRAMUSCULAR | Status: AC
  Administered 2022-02-21: 1 mg via INTRAVENOUS

## 2022-02-21 MED ORDER — STERILE WATER FOR INJECTION IJ SOLN
INTRAMUSCULAR | Status: AC
  Filled 2022-02-21: qty 10

## 2022-02-21 MED ORDER — SODIUM ZIRCONIUM CYCLOSILICATE 10 G PO PACK
10.0000 g | PACK | Freq: Two times a day (BID) | ORAL | Status: DC
  Administered 2022-02-21 – 2022-03-01 (×16): 10 g via ORAL
  Filled 2022-02-21 (×16): qty 1

## 2022-02-21 NOTE — Inpatient Diabetes Management (Signed)
Inpatient Diabetes Program Recommendations  AACE/ADA: New Consensus Statement on Inpatient Glycemic Control (2015)  Target Ranges:  Prepandial:   less than 140 mg/dL      Peak postprandial:   less than 180 mg/dL (1-2 hours)      Critically ill patients:  140 - 180 mg/dL   Lab Results  Component Value Date   GLUCAP 173 (H) 02/21/2022   HGBA1C 11.0 (H) 02/14/2022    Review of Glycemic Control  Latest Reference Range & Units 02/20/22 16:41 02/20/22 20:52 02/21/22 07:38  Glucose-Capillary 70 - 99 mg/dL 322 (H) 163 (H) 173 (H)  (H): Data is abnormally high Diabetes history: DM1 (does NOT make any insulin; requires basal, correction, and carb coverage insulin) Outpatient Diabetes medications: Lantus 18 units daily, Novolog 3 units TID with meals, Metformin 1000 mg BID Current orders for Inpatient glycemic control: Semglee 11 units BID, Novolog 0-9 units TID with meals, Novolog 0-5 units QHS, Novolog 2 units TID with meals   Inpatient Diabetes Program Recommendations:     Consider increasing meal coverage to Novolog 4 units TID with meals if patient eats at least 50% of meals.   Thanks, Bronson Curb, MSN, RNC-OB Diabetes Coordinator [phone] (8a-5p)

## 2022-02-21 NOTE — Hospital Course (Addendum)
Brittany [name] is a 62 y.o. 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 difficile infection, who presented to the hospital from SNF with hypoglycemia and seizures.  Reportedly, glucose levels was under 40s and dropped into the 30s.  was started on 10% dextrose infusion by EMS and brought to the emergency department. 12/29  Continued to have labile Glc and recurrent/refractory hyperkalemia, nephrology was consulted to manage hyperkalemia and insulin regimen adjusted.   Hyperkalemia slowly improved to Potassium 4.4 on 01/12 and stable for discharge however needs evaluation by her ALF which are unable to send anyone Friday 01/12, see TOC notes.  Unfortunately patient will need to stay here over the weekend. / pending eval by facility  May require long term care - increased agitation / dementia behaviors, pulling out colostomy, fall risk - requiring sitter and temporary restraints yesterday 01/14 but she is much more calm today 01/15  Consultants:  Nephrology   Procedures: None       ASSESSMENT & PLAN:   Principal Problem:   Seizure ([place]) Active Problems:   Hyperkalemia   Acute metabolic encephalopathy   Hypoglycemia   Hypothermia   Hypotension   Chronic diastolic CHF (congestive heart failure) ([place])   History of stroke   HLD (hyperlipidemia)   Depression with anxiety   COPD (chronic obstructive pulmonary disease) ([place])   Tobacco dependence   Elevated lactic acid level   HTN (hypertension)   Hyperglycemia due to type 2 diabetes mellitus ([place])   Dementia without behavioral disturbance ([place])   Seizures prior to admission:  This was probably due to hypoglycemia.   No seizures on admission.   Continue Depakote.   Ativan as needed for seizures.     Type II DM with hyperglycemia, recent  hypoglycemia, labile glucose levels:  Hemoglobin A1c on 02/14/2022 was 11.  Hemoglobin A1c was 13.2 on 10/30/2021.  Glucose level dropped to 66 on the morning of 02/20/2022.   Continue basal + SSI here  Refractory, recurrent hyperkalemia:  Improving - resolved and K stable Continue Lasix, Lokelma, fludrocortisone and sodium bicarbonate per nephrology Check intermittent BMP while here    Hypotension: Improved.   Adrenal insufficiency was ruled out with ACTH stimulation test. Cardizem and midodrine have been discontinued. Monitor BP closely.   Acute metabolic encephalopathy, lethargy: Resolved Dementia w/ behavioral disturbance May require long term care - increased agitation / dementia behaviors, pulling out colostomy, fall risk - requiring sitter and temporary restraints    General weakness: PT recommends home health therapy   Other comorbidities include chronic diastolic CHF, depression, anxiety, hypertension, tobacco use disorder, dementia,    Lactic acidosis, hypothermia: Resolved    DVT prophylaxis: lovenox  Pertinent IV fluids/nutrition: no continuous IV fluids  Central lines / invasive devices: none   Code Status: FULL CODE   Current Admission Status: inpatient   TOC needs / Dispo plan: home health Barriers to discharge / significant pending items: awaiting reevaluation by ALF to take her back, discharge order 01/12 (Friday) had to be cancelled may be stuck here few more days - TOC following

## 2022-02-21 NOTE — Progress Notes (Signed)
Physical Therapy Treatment Patient Details Name: Brittany [name] MRN: [id number] DOB: 08-09-1960 Today's Date: 02/21/2022   History of Present Illness Brittany [name] is a 62 y.o. 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 difficile, who presents on 02/09/22 with seizure. While in hospital, patient has experienced issues with blood glucose.    PT Comments    Pt seen for PT tx with pt received asleep in bed with sitter in room. Pt requires extra time/cuing to increase alertness with pt requiring mod/max assist to come to sitting EOB (Pt initiates movement but unable to complete without assistance). Provided pt with cold wet cloth to increase alertness with some improvement. Pt is able to transfer STS & ambulate in room with 1 UE HHA min assist; pt does frequently reach for objects with other UE for support. Pt very impulsive & easily distracted requiring max cuing/supervision for safety. Further mobility deferred at this time 2/2 ongoing lethargy, impaired cognition, and pt's eagerness to consume breakfast.    Recommendations for follow up therapy are one component of a multi-disciplinary discharge planning process, led by the attending physician.  Recommendations may be updated based on patient status, additional functional criteria and insurance authorization.  Follow Up Recommendations  Home health PT     Assistance Recommended at Discharge Frequent or constant Supervision/Assistance  Patient can return home with the following A little help with walking and/or transfers;A little help with bathing/dressing/bathroom;Assistance with cooking/housework;Direct supervision/assist for medications management;Assist for transportation;Help with stairs or ramp for entrance   Equipment Recommendations  None  recommended by PT    Recommendations for Other Services       Precautions / Restrictions Precautions Precautions: Fall Restrictions Weight Bearing Restrictions: No     Mobility  Bed Mobility Overal bed mobility: Needs Assistance       Supine to sit: Max assist, Mod assist (suspect 2/2 lethargy)          Transfers Overall transfer level: Needs assistance Equipment used: 1 person hand held assist Transfers: Sit to/from Stand Sit to Stand: Min assist                Ambulation/Gait Ambulation/Gait assistance: Min assist Gait Distance (Feet): 25 Feet Assistive device: 1 person hand held assist Gait Pattern/deviations: Decreased step length - right, Decreased step length - left, Decreased stride length           Stairs             Wheelchair Mobility    Modified Rankin (Stroke Patients Only)       Balance Overall balance assessment: Needs assistance Sitting-balance support: Feet supported, No upper extremity supported Sitting balance-Leahy Scale: Fair     Standing balance support: Single extremity supported, During functional activity Standing balance-Leahy Scale: Fair                              Cognition Arousal/Alertness: Lethargic, Suspect due to medications Behavior During Therapy: Impulsive Overall Cognitive Status: No family/caregiver present to determine baseline cognitive functioning                                 General Comments: Pt very impulsive throughout session, requires heavy supervision/cuing for safety. Pt attempting to eat breakfast with hands while  standing in room with MAX cuing to sit down so PT can provide her with breakfast tray.        Exercises      General Comments        Pertinent Vitals/Pain Pain Assessment Pain Assessment: Faces Faces Pain Scale: No hurt    Home Living                          Prior Function            PT Goals (current goals can now be  found in the care plan section) Acute Rehab PT Goals Patient Stated Goal: "Be independent" PT Goal Formulation: With patient Time For Goal Achievement: 03/03/22 Potential to Achieve Goals: Fair Progress towards PT goals: Progressing toward goals    Frequency    Min 2X/week      PT Plan Current plan remains appropriate    Co-evaluation              AM-PAC PT "6 Clicks" Mobility   Outcome Measure  Help needed turning from your back to your side while in a flat bed without using bedrails?: None Help needed moving from lying on your back to sitting on the side of a flat bed without using bedrails?: None Help needed moving to and from a bed to a chair (including a wheelchair)?: A Little Help needed standing up from a chair using your arms (e.g., wheelchair or bedside chair)?: A Little Help needed to walk in hospital room?: A Little Help needed climbing 3-5 steps with a railing? : A Little 6 Click Score: 20    End of Session   Activity Tolerance: Patient limited by lethargy Patient left: in chair;with call bell/phone within reach;with nursing/sitter in room   PT Visit Diagnosis: Unsteadiness on feet (R26.81);Repeated falls (R29.6);Muscle weakness (generalized) (M62.81);History of falling (Z91.81);Difficulty in walking, not elsewhere classified (R26.2)     Time: 6283-1517 PT Time Calculation (min) (ACUTE ONLY): 12 min  Charges:  $Therapeutic Activity: 8-22 mins                     Lavone Nian, PT, DPT 02/21/22, 9:11 AM   [name] 02/21/2022, 9:09 AM

## 2022-02-21 NOTE — Progress Notes (Signed)
PROGRESS NOTE    Brittany [name]   SNK:[id number] DOB: May 15, 1960  DOA: 02/09/2022 Date of Service: 02/21/22 PCP: [name], NP     Brief Narrative / Hospital Course:  Brittany [name] is a 62 y.o. 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 difficile infection, who presented to the hospital from SNF with hypoglycemia and seizures.  Reportedly, glucose levels was under 40s and dropped into the 30s.  He was started on 10% dextrose infusion by EMS and brought to the emergency department. Continued to have labile Glc and recurrent/refractory hyperkalemia, nephrology was consulted and insulin regimen adjusted. Potassium WNL 02/21/22 and Glc have been stable, nephrology team reduced lokelma   Consultants:  Nephrology   Procedures: None       ASSESSMENT & PLAN:   Principal Problem:   Seizure ([place]) Active Problems:   Hyperkalemia   Acute metabolic encephalopathy   Hypoglycemia   Hypothermia   Hypotension   Chronic diastolic CHF (congestive heart failure) ([place])   History of stroke   HLD (hyperlipidemia)   Depression with anxiety   COPD (chronic obstructive pulmonary disease) ([facility])   Tobacco dependence   Elevated lactic acid level   HTN (hypertension)   Hyperglycemia due to type 2 diabetes mellitus ([place])   Dementia without behavioral disturbance ([place])   Seizures prior to admission:  This was probably due to hypoglycemia.   No seizures on admission.   Continue Depakote.   Ativan as needed for seizures.     Type II DM with hyperglycemia, recent hypoglycemia, labile glucose levels:  Hemoglobin A1c on 02/14/2022 was 11.  Hemoglobin A1c was 13.2 on 10/30/2021.  Glucose level dropped to 66 on the morning of 02/20/2022.   Continue current dose of insulin regimen, glargine  11 units twice daily, NovoLog 2 units 3 times daily. Use NovoLog as needed for hyperglycemia.    Refractory, recurrent hyperkalemia:  improving Avoiding IV insulin because of recent hypoglycemia. Continue Lasix, Lokelma and sodium bicarbonate per nephrology.   Hypotension: Improved.   Adrenal insufficiency was ruled out with ACTH stimulation test. Cardizem and midodrine have been discontinued. Monitor BP closely.   Acute metabolic encephalopathy, lethargy: Resolved   General weakness: PT recommends home health therapy   Other comorbidities include chronic diastolic CHF, depression, anxiety, hypertension, tobacco use disorder, dementia,    Lactic acidosis, hypothermia: Resolved    DVT prophylaxis: lovenox  Pertinent IV fluids/nutrition: no continuous IV fluids  Central lines / invasive devices: none   Code Status: FULL CODE   Current Admission Status: inpatient   TOC needs / Dispo plan: home health Barriers to discharge / significant pending items: in potassium and Glc remain stable may be able to discharge 1-2 days              Subjective / Brief ROS:  Patient reports no concerns Denies CP/SOB.  Pain controlled.  Denies new weakness.  Tolerating diet.  Reports no concerns w/ urination/defecation.   Family Communication: none at this time     Objective Findings:  Vitals:   02/20/22 2353 02/21/22 0400 02/21/22 0741 02/21/22 1611  BP:  96/68 (!) 109/90 110/77  Pulse:  87 88 (!) 109  Resp: '14 15 16   '$ Temp:  97.7 F (36.5 C) 98.1 F (36.7 C) 98.2 F (36.8 C)  TempSrc:  Axillary Oral   SpO2:  100% 100% 99%  Weight:  55.9 kg    Height:        Intake/Output Summary (Last 24 hours) at 02/21/2022 1658 Last data filed at 02/21/2022 1404 Gross per 24 hour  Intake 1210 ml  Output 500 ml  Net 710 ml   Filed Weights   02/18/22 0346 02/19/22 0748 02/21/22 0400  Weight: 52.4 kg 52 kg 55.9 kg    Examination:  Physical Exam Constitutional:       General: She is not in acute distress. Cardiovascular:     Rate and Rhythm: Normal rate and regular rhythm.  Pulmonary:     Effort: Pulmonary effort is normal.     Breath sounds: Normal breath sounds.  Neurological:     General: No focal deficit present.     Mental Status: She is alert. She is disoriented.  Psychiatric:        Mood and Affect: Mood normal.        Behavior: Behavior normal.          Scheduled Medications:   aspirin EC  81 mg Oral Daily   atorvastatin  40 mg Oral QHS   buPROPion  150 mg Oral Daily   divalproex  500 mg Oral BID   enoxaparin (LOVENOX) injection  40 mg Subcutaneous Q24H   famotidine  20 mg Oral Daily   feeding supplement (NEPRO CARB STEADY)  237 mL Oral BID BM   furosemide  20 mg Oral Daily   insulin aspart  0-5 Units Subcutaneous QHS   insulin aspart  0-9 Units Subcutaneous TID WC   insulin aspart  2 Units Subcutaneous TID WC   insulin glargine-yfgn  11 Units Subcutaneous BID   melatonin  2.5 mg Oral QHS   mirtazapine  15 mg Oral QHS   multivitamin with minerals  1 tablet Oral Daily   nicotine  21 mg Transdermal Daily   QUEtiapine  50 mg Oral QHS   rOPINIRole  0.5 mg Oral QHS   sodium bicarbonate  1,300 mg Oral BID   sodium chloride flush  10-40 mL Intracatheter Q12H   sodium zirconium cyclosilicate  10 g Oral BID    Continuous Infusions:   PRN Medications:  acetaminophen, albuterol, dextrose, loratadine, LORazepam, ondansetron (ZOFRAN) IV, mouth rinse  Antimicrobials from admission:  Anti-infectives (From admission, onward)    Start     Dose/Rate Route Frequency Ordered Stop   02/09/22 1345  piperacillin-tazobactam (ZOSYN) IVPB 3.375 g        3.375 g 100 mL/hr over 30 Minutes Intravenous  Once 02/09/22 1336 02/09/22 1518           Data Reviewed:  I have personally reviewed the following...  CBC: Recent Labs  Lab 02/15/22 0426 02/17/22 0414 02/20/22 0853  WBC 7.4 10.8* 9.6  NEUTROABS 2.4 4.0 3.5  HGB 10.7* 9.0*  10.6*  HCT 33.7* 28.7* 34.1*  MCV 88.0 89.4 90.5  PLT 281 277 962   Basic Metabolic Panel: Recent Labs  Lab 02/17/22 0414 02/18/22 0558 02/18/22 1618 02/19/22 0547 02/20/22 0853 02/21/22 0527  NA 142 141  --  138 137 139  K 4.7 5.2* 5.7* 5.7* 6.0* 4.8  CL 117* 114*  --  111 110 110  CO2 21* 24  --  20* 22 22  GLUCOSE 124* 123*  --  320* 214* 203*  BUN 17 20  --  22 28* 29*  CREATININE 0.72 0.82  --  1.05* 1.03* 0.96  CALCIUM 8.4* 8.5*  --  8.2* 8.6* 8.3*   GFR: Estimated Creatinine Clearance: 47.7 mL/min (by C-G formula based on SCr of 0.96 mg/dL). Liver Function Tests: No results for input(s): "AST", "ALT", "ALKPHOS", "BILITOT", "PROT", "ALBUMIN" in the last 168 hours. No results for input(s): "LIPASE", "AMYLASE" in the last 168 hours. No results for input(s): "AMMONIA" in the last 168 hours. Coagulation Profile: No results for input(s): "INR", "PROTIME" in the last 168 hours. Cardiac Enzymes: No results for input(s): "CKTOTAL", "CKMB", "CKMBINDEX", "TROPONINI" in the last 168 hours. BNP (last 3 results) No results for input(s): "PROBNP" in the last 8760 hours. HbA1C: No results for input(s): "HGBA1C" in the last 72 hours. CBG: Recent Labs  Lab 02/21/22 0738 02/21/22 1137 02/21/22 1611 02/21/22 1613 02/21/22 1645  GLUCAP 173* 178* 32* 31* 172*   Lipid Profile: No results for input(s): "CHOL", "HDL", "LDLCALC", "TRIG", "CHOLHDL", "LDLDIRECT" in the last 72 hours. Thyroid Function Tests: No results for input(s): "TSH", "T4TOTAL", "FREET4", "T3FREE", "THYROIDAB" in the last 72 hours. Anemia Panel: No results for input(s): "VITAMINB12", "FOLATE", "FERRITIN", "TIBC", "IRON", "RETICCTPCT" in the last 72 hours. Most Recent Urinalysis On File:     Component Value Date/Time   COLORURINE YELLOW (A) 02/09/2022 1327   APPEARANCEUR HAZY (A) 02/09/2022 1327   APPEARANCEUR Hazy 04/04/2011 1935   LABSPEC 1.013 02/09/2022 1327   LABSPEC >1.060 04/04/2011 1935   PHURINE  5.0 02/09/2022 1327   GLUCOSEU 50 (A) 02/09/2022 1327   GLUCOSEU 50 mg/dL 04/04/2011 1935   HGBUR NEGATIVE 02/09/2022 1327   BILIRUBINUR NEGATIVE 02/09/2022 1327   BILIRUBINUR Negative 04/04/2011 1935   KETONESUR NEGATIVE 02/09/2022 1327   PROTEINUR NEGATIVE 02/09/2022 1327   NITRITE NEGATIVE 02/09/2022 1327   LEUKOCYTESUR NEGATIVE 02/09/2022 1327   LEUKOCYTESUR Negative 04/04/2011 1935   Sepsis Labs: '@LABRCNTIP'$ (procalcitonin:4,lacticidven:4) Microbiology: Recent Results (from the past 240 hour(s))  MRSA Next Gen by PCR, Nasal     Status: None   Collection Time: 02/20/22  9:35 PM   Specimen: Nasal Mucosa; Nasal Swab  Result Value Ref Range Status   MRSA by PCR Next Gen NOT DETECTED NOT DETECTED Final    Comment: (NOTE) The GeneXpert MRSA Assay (FDA approved for NASAL specimens only), is one component of a comprehensive MRSA colonization surveillance program. It is not intended to diagnose MRSA infection nor to guide or monitor treatment for MRSA infections. Test performance is not FDA approved in patients less than 38 years old. Performed at [facility], [address], [place], [place] [address]       Radiology Studies last 3 days: No results found.           LOS: 6 days     Emeterio Reeve, DO [facility] 02/21/2022, 4:58 PM    Dictation software may have been used to generate the above note. Typos may occur and escape review in typed/dictated notes. Please contact Dr Sheppard Coil directly for clarity if needed.  Staff may message me via secure chat in [place]  but this may not receive an immediate response,  please page me for urgent matters!  If 7PM-7AM, please contact night coverage [email or web address]

## 2022-02-21 NOTE — Progress Notes (Signed)
[facility]  PROGRESS NOTE   Subjective:   Patient seen sitting up in chair, currently completing breakfast No complaints to offer at this time No lower extremity edema Potassium 4.8  Objective:  Vital signs: Blood pressure (!) 109/90, pulse 88, temperature 98.1 F (36.7 C), temperature source Oral, resp. rate 16, height 5' (1.524 m), weight 55.9 kg, SpO2 100 %.  Intake/Output Summary (Last 24 hours) at 02/21/2022 1200 Last data filed at 02/21/2022 2683 Gross per 24 hour  Intake 1210 ml  Output 700 ml  Net 510 ml    Filed Weights   02/18/22 0346 02/19/22 0748 02/21/22 0400  Weight: 52.4 kg 52 kg 55.9 kg     Physical Exam: General:  No acute distress  Head:  Normocephalic, atraumatic. Moist oral mucosal membranes  Eyes:  Anicteric  Lungs:   Clear to auscultation, normal effort  Heart:  S1S2 no rubs  Abdomen:   Soft, nontender, bowel sounds present, ostomy present  Extremities: No peripheral edema.  Neurologic:  Awake, alert, following commands  Skin:  No lesions  Access: None    Basic Metabolic Panel: Recent Labs  Lab 02/17/22 0414 02/18/22 0558 02/18/22 1618 02/19/22 0547 02/20/22 0853 02/21/22 0527  NA 142 141  --  138 137 139  K 4.7 5.2* 5.7* 5.7* 6.0* 4.8  CL 117* 114*  --  111 110 110  CO2 21* 24  --  20* 22 22  GLUCOSE 124* 123*  --  320* 214* 203*  BUN 17 20  --  22 28* 29*  CREATININE 0.72 0.82  --  1.05* 1.03* 0.96  CALCIUM 8.4* 8.5*  --  8.2* 8.6* 8.3*    GFR: Estimated Creatinine Clearance: 47.7 mL/min (by C-G formula based on SCr of 0.96 mg/dL).  Liver Function Tests: No results for input(s): "AST", "ALT", "ALKPHOS", "BILITOT", "PROT", "ALBUMIN" in the last 168 hours. No results for input(s): "LIPASE", "AMYLASE" in the last 168 hours. No results for input(s): "AMMONIA" in the last 168 hours.  CBC: Recent Labs  Lab 02/15/22 0426 02/17/22 0414 02/20/22 0853  WBC 7.4 10.8* 9.6  NEUTROABS 2.4 4.0 3.5  HGB 10.7* 9.0* 10.6*   HCT 33.7* 28.7* 34.1*  MCV 88.0 89.4 90.5  PLT 281 277 312      HbA1C: Hgb A1c MFr Bld  Date/Time Value Ref Range Status  02/14/2022 09:22 AM 11.0 (H) 4.8 - 5.6 % Final    Comment:    (NOTE)         Prediabetes: 5.7 - 6.4         Diabetes: >6.4         Glycemic control for adults with diabetes: <7.0   10/30/2021 10:04 AM 13.2 (H) 4.8 - 5.6 % Final    Comment:    (NOTE) Pre diabetes:          5.7%-6.4%  Diabetes:              >6.4%  Glycemic control for   <7.0% adults with diabetes     Urinalysis: No results for input(s): "COLORURINE", "LABSPEC", "PHURINE", "GLUCOSEU", "HGBUR", "BILIRUBINUR", "KETONESUR", "PROTEINUR", "UROBILINOGEN", "NITRITE", "LEUKOCYTESUR" in the last 72 hours.  Invalid input(s): "APPERANCEUR"    Imaging: No results found.   Medications:      aspirin EC  81 mg Oral Daily   atorvastatin  40 mg Oral QHS   buPROPion  150 mg Oral Daily   divalproex  500 mg Oral BID   enoxaparin (LOVENOX) injection  40  mg Subcutaneous Q24H   famotidine  20 mg Oral Daily   feeding supplement (NEPRO CARB STEADY)  237 mL Oral BID BM   furosemide  20 mg Oral Daily   insulin aspart  0-5 Units Subcutaneous QHS   insulin aspart  0-9 Units Subcutaneous TID WC   insulin aspart  2 Units Subcutaneous TID WC   insulin glargine-yfgn  11 Units Subcutaneous BID   melatonin  2.5 mg Oral QHS   mirtazapine  15 mg Oral QHS   multivitamin with minerals  1 tablet Oral Daily   nicotine  21 mg Transdermal Daily   QUEtiapine  50 mg Oral QHS   rOPINIRole  0.5 mg Oral QHS   sodium bicarbonate  1,300 mg Oral BID   sodium chloride flush  10-40 mL Intracatheter Q12H   sodium zirconium cyclosilicate  10 g Oral BID   sterile water (preservative free)        Assessment/ Plan:     62 y.o.  female with past medical history of COPD, diabetes, hypertension, lupus, pancreatic cancer, seizures, RLS, who was admitted to [facility] on 02/09/2022 for Seizure ([place]) [R56.9] Hypoglycemia  [E16.2] SIRS (systemic inflammatory response syndrome) ([place]) [R65.10] Hyperkalemia [E87.5]   #1: Hyperkalemia: Hyperkalemia most likely secondary to type IV renal tubular acidosis.  Potassium remains elevated, continue management with Lokelma.  Veltassa less effective due to colon absorption.  Lokelma increased to 3 times daily yesterday, a.m. potassium 4.8 today.  Will begin to decrease Lokelma to twice daily and monitor.   #2: Hypotension: Blood pressure 109/90 today.   #3: Diabetes mellitis, type 2 Hgb A1c 11.0 on 02/14/22.  Glucose elevated greater than 300 yesterday.  Sliding scale insulin managed by primary team.  Patient encouraged to monitor diet and attempts to improve management of glucose levels.   LOS: [facility] 1/10/202412:00 PM

## 2022-02-21 NOTE — Progress Notes (Signed)
Episode of agitation overnight which involved the patient wandering, cursing, and yelling at staff. 1 mg IV Ativan given per order.

## 2022-02-21 NOTE — Care Management Important Message (Signed)
Important Message  Patient Details  Name: Brittany [name] MRN: [id number] Date of Birth: 1960-04-25   Medicare Important Message Given:  Other (see comment)  Attempted to review Medicare IM with patient via room phone [phone]).  Per nursing staff, patient asleep.  Will attempt to reach at a later time.    [name] 02/21/2022, 10:52 AM

## 2022-02-22 DIAGNOSIS — R569 Unspecified convulsions: Secondary | ICD-10-CM | POA: Diagnosis not present

## 2022-02-22 LAB — BASIC METABOLIC PANEL
Anion gap: 6 (ref 5–15)
BUN: 29 mg/dL — ABNORMAL HIGH (ref 8–23)
CO2: 23 mmol/L (ref 22–32)
Calcium: 8.4 mg/dL — ABNORMAL LOW (ref 8.9–10.3)
Chloride: 111 mmol/L (ref 98–111)
Creatinine, Ser: 0.96 mg/dL (ref 0.44–1.00)
GFR, Estimated: >60 mL/min (ref >60)
Glucose, Bld: 219 mg/dL — ABNORMAL HIGH (ref 70–99)
Potassium: 5.2 mmol/L — ABNORMAL HIGH (ref 3.5–5.1)
Sodium: 140 mmol/L (ref 135–145)

## 2022-02-22 LAB — CBC
HCT: 28.9 % — ABNORMAL LOW (ref 36.0–46.0)
Hemoglobin: 9.3 g/dL — ABNORMAL LOW (ref 12.0–15.0)
MCH: 28.5 pg (ref 26.0–34.0)
MCHC: 32.2 g/dL (ref 30.0–36.0)
MCV: 88.7 fL (ref 80.0–100.0)
Platelets: 298 10*3/uL (ref 150–400)
RBC: 3.26 MIL/uL — ABNORMAL LOW (ref 3.87–5.11)
RDW: 23.4 % — ABNORMAL HIGH (ref 11.5–15.5)
WBC: 11.3 10*3/uL — ABNORMAL HIGH (ref 4.0–10.5)
nRBC: 1 % — ABNORMAL HIGH (ref 0.0–0.2)

## 2022-02-22 LAB — GLUCOSE, CAPILLARY
Glucose-Capillary: 155 mg/dL — ABNORMAL HIGH (ref 70–99)
Glucose-Capillary: 111 mg/dL — ABNORMAL HIGH (ref 70–99)
Glucose-Capillary: 260 mg/dL — ABNORMAL HIGH (ref 70–99)
Glucose-Capillary: 226 mg/dL — ABNORMAL HIGH (ref 70–99)

## 2022-02-22 MED ORDER — FLUDROCORTISONE ACETATE 0.1 MG PO TABS
0.0500 mg | ORAL_TABLET | Freq: Every day | ORAL | Status: DC
  Administered 2022-02-22 – 2022-03-01 (×8): 0.05 mg via ORAL
  Filled 2022-02-22 (×9): qty 0.5

## 2022-02-22 NOTE — Progress Notes (Signed)
Occupational Therapy Treatment Patient Details Name: LIZETTE PAZOS MRN: [id number] DOB: 30-Jun-1960 Today's Date: 02/22/2022   History of present illness MAHOGANI HOLOHAN is a 62 y.o. 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 difficile, who presents on 02/09/22 with seizure. While in hospital, patient has experienced issues with blood glucose.   OT comments  Ms. Amble is more alert and better oriented than during earlier sessions, demonstrates better safety awareness and exhibits less impulsiveness. She is able to perform sit<>stand transfers from low surface w/o physical assistance, ambulates w/ RW around nurses' station and down hallway without LOB, good control of RW, and without the hurried, inbalanced steps she has displayed earlier. Pt continues to make good progress. DC recs remain appropriate.   Recommendations for follow up therapy are one component of a multi-disciplinary discharge planning process, led by the attending physician.  Recommendations may be updated based on patient status, additional functional criteria and insurance authorization.    Follow Up Recommendations  Home health OT     Assistance Recommended at Discharge Intermittent Supervision/Assistance  Patient can return home with the following  A little help with walking and/or transfers;A little help with bathing/dressing/bathroom;Assistance with cooking/housework;Assist for transportation   Equipment Recommendations       Recommendations for Other Services      Precautions / Restrictions Precautions Precautions: Fall Restrictions Weight Bearing Restrictions: No       Mobility Bed Mobility               General bed mobility comments: received/left in recliner    Transfers Overall transfer level: Needs  assistance Equipment used: Rolling walker (2 wheels) Transfers: Sit to/from Stand Sit to Stand: Supervision           General transfer comment: no cueing or physical assistance required     Balance Overall balance assessment: Needs assistance Sitting-balance support: Feet supported, No upper extremity supported Sitting balance-Leahy Scale: Good     Standing balance support: Single extremity supported, During functional activity, Reliant on assistive device for balance Standing balance-Leahy Scale: Good                             ADL either performed or assessed with clinical judgement   ADL                                              Extremity/Trunk Assessment Upper Extremity Assessment Upper Extremity Assessment: Generalized weakness   Lower Extremity Assessment Lower Extremity Assessment: Generalized weakness        Vision       Perception     Praxis      Cognition Arousal/Alertness: Awake/alert Behavior During Therapy: WFL for tasks assessed/performed Overall Cognitive Status: Within Functional Limits for tasks assessed                                 General Comments: Pt is alert and oriented today, demonstrates little impulsiveness        Exercises Other Exercises Other Exercises: Educ re: falls prevention, safe use of RW, moving more slowly for safety    Shoulder Instructions  General Comments      Pertinent Vitals/ Pain       Pain Assessment Pain Assessment: No/denies pain  Home Living                                          Prior Functioning/Environment              Frequency  Min 2X/week        Progress Toward Goals  OT Goals(current goals can now be found in the care plan section)  Progress towards OT goals: Progressing toward goals  Acute Rehab OT Goals OT Goal Formulation: With patient Time For Goal Achievement: 03/03/22 Potential to Achieve  Goals: Good  Plan Discharge plan remains appropriate;Frequency remains appropriate    Co-evaluation                 AM-PAC OT "6 Clicks" Daily Activity     Outcome Measure   Help from another person eating meals?: None Help from another person taking care of personal grooming?: A Little Help from another person toileting, which includes using toliet, bedpan, or urinal?: A Little Help from another person bathing (including washing, rinsing, drying)?: A Little Help from another person to put on and taking off regular upper body clothing?: None Help from another person to put on and taking off regular lower body clothing?: None 6 Click Score: 21    End of Session Equipment Utilized During Treatment: Rolling walker (2 wheels)  OT Visit Diagnosis: Unsteadiness on feet (R26.81);Muscle weakness (generalized) (M62.81)   Activity Tolerance Patient tolerated treatment well   Patient Left in chair;with call bell/phone within reach;with chair alarm set;with nursing/sitter in room   Nurse Communication Other (comment) (pt is hungry, requests snack following blood sugar ck)        Time: 5397-6734 OT Time Calculation (min): 9 min  Charges: OT General Charges $OT Visit: 1 Visit OT Treatments $Self Care/Home Management : 8-22 mins  Josiah Lobo, PhD, MS, OTR/L 02/22/22, 4:26 PM

## 2022-02-22 NOTE — Progress Notes (Signed)
PROGRESS NOTE    Brittany [name]   SAY:[id number] DOB: Jul 08, 1960  DOA: 02/09/2022 Date of Service: 02/22/22 PCP: [name], NP     Brief Narrative / Hospital Course:  Brittany [name] is a 62 y.o. 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 difficile infection, who presented to the hospital from SNF with hypoglycemia and seizures.  Reportedly, glucose levels was under 40s and dropped into the 30s.  He was started on 10% dextrose infusion by EMS and brought to the emergency department. Continued to have labile Glc and recurrent/refractory hyperkalemia, nephrology was consulted and insulin regimen adjusted. Potassium WNL 02/21/22 and Glc have been stable, nephrology team reduced lokelma. Potassium 5.2 02/22/22, nephrology started fludrocortisone 0.5 mg daily and continue lokelma 10 bid  Consultants:  Nephrology   Procedures: None       ASSESSMENT & PLAN:   Principal Problem:   Seizure ([place]) Active Problems:   Hyperkalemia   Acute metabolic encephalopathy   Hypoglycemia   Hypothermia   Hypotension   Chronic diastolic CHF (congestive heart failure) ([place])   History of stroke   HLD (hyperlipidemia)   Depression with anxiety   COPD (chronic obstructive pulmonary disease) ([facility])   Tobacco dependence   Elevated lactic acid level   HTN (hypertension)   Hyperglycemia due to type 2 diabetes mellitus ([place])   Dementia without behavioral disturbance ([place])   Seizures prior to admission:  This was probably due to hypoglycemia.   No seizures on admission.   Continue Depakote.   Ativan as needed for seizures.     Type II DM with hyperglycemia, recent hypoglycemia, labile glucose levels:  Hemoglobin A1c on 02/14/2022 was 11.  Hemoglobin A1c was 13.2 on 10/30/2021.  Glucose  level dropped to 66 on the morning of 02/20/2022.   Continue current dose of insulin regimen, glargine 11 units twice daily, NovoLog 2 units 3 times daily. Use NovoLog as needed for hyperglycemia.    Refractory, recurrent hyperkalemia:  improving Avoiding IV insulin because of recent hypoglycemia. Continue Lasix, Lokelma and sodium bicarbonate per nephrology Potassium 5.2 02/22/22, nephrology started fludrocortisone 0.5 mg daily and continue lokelma 10 bid   Hypotension: Improved.   Adrenal insufficiency was ruled out with ACTH stimulation test. Cardizem and midodrine have been discontinued. Monitor BP closely.   Acute metabolic encephalopathy, lethargy: Resolved   General weakness: PT recommends home health therapy   Other comorbidities include chronic diastolic CHF, depression, anxiety, hypertension, tobacco use disorder, dementia,    Lactic acidosis, hypothermia: Resolved    DVT prophylaxis: lovenox  Pertinent IV fluids/nutrition: no continuous IV fluids  Central lines / invasive devices: none   Code Status: FULL CODE   Current Admission Status: inpatient   TOC needs / Dispo plan: home health Barriers to discharge / significant pending items: in potassium and Glc remain stable may be able to discharge hopefully tomorrow              Subjective / Brief ROS:  Patient reports no concerns Denies CP/SOB.  Pain controlled.  Denies new weakness.  Tolerating diet.  Reports no concerns w/ urination/defecation.   Family Communication: none at this time     Objective Findings:  Vitals:   02/22/22 0520 02/22/22 0738 02/22/22 0746 02/22/22 1139  BP: 94/66 100/84 96/80 107/77  Pulse: 91 (!) 105 99 89  Resp: 16 18  16 17  Temp: 98.3 F (36.8 C) 98.1 F (36.7 C) 98.2 F (36.8 C) 98.1 F (36.7 C)  TempSrc: Oral  Oral Oral  SpO2: 100% 98% 97% 99%  Weight: 54.4 kg     Height:        Intake/Output Summary (Last 24 hours) at 02/22/2022 1443 Last data filed at  02/22/2022 1400 Gross per 24 hour  Intake 490 ml  Output 552 ml  Net -62 ml   Filed Weights   02/19/22 0748 02/21/22 0400 02/22/22 0520  Weight: 52 kg 55.9 kg 54.4 kg    Examination:  Physical Exam Constitutional:      General: She is not in acute distress. Cardiovascular:     Rate and Rhythm: Normal rate and regular rhythm.  Pulmonary:     Effort: Pulmonary effort is normal.     Breath sounds: Normal breath sounds.  Neurological:     General: No focal deficit present.     Mental Status: She is alert.  Psychiatric:        Mood and Affect: Mood normal.        Behavior: Behavior normal.          Scheduled Medications:   aspirin EC  81 mg Oral Daily   atorvastatin  40 mg Oral QHS   buPROPion  150 mg Oral Daily   divalproex  500 mg Oral BID   enoxaparin (LOVENOX) injection  40 mg Subcutaneous Q24H   famotidine  20 mg Oral Daily   feeding supplement (NEPRO CARB STEADY)  237 mL Oral BID BM   fludrocortisone  0.05 mg Oral Daily   furosemide  20 mg Oral Daily   insulin aspart  0-5 Units Subcutaneous QHS   insulin aspart  0-9 Units Subcutaneous TID WC   insulin aspart  2 Units Subcutaneous TID WC   insulin glargine-yfgn  11 Units Subcutaneous BID   melatonin  2.5 mg Oral QHS   mirtazapine  15 mg Oral QHS   multivitamin with minerals  1 tablet Oral Daily   nicotine  21 mg Transdermal Daily   QUEtiapine  50 mg Oral QHS   rOPINIRole  0.5 mg Oral QHS   sodium bicarbonate  1,300 mg Oral BID   sodium chloride flush  10-40 mL Intracatheter Q12H   sodium zirconium cyclosilicate  10 g Oral BID    Continuous Infusions:   PRN Medications:  acetaminophen, albuterol, dextrose, loratadine, LORazepam, ondansetron (ZOFRAN) IV, mouth rinse  Antimicrobials from admission:  Anti-infectives (From admission, onward)    Start     Dose/Rate Route Frequency Ordered Stop   02/09/22 1345  piperacillin-tazobactam (ZOSYN) IVPB 3.375 g        3.375 g 100 mL/hr over 30 Minutes  Intravenous  Once 02/09/22 1336 02/09/22 1518           Data Reviewed:  I have personally reviewed the following...  CBC: Recent Labs  Lab 02/17/22 0414 02/20/22 0853 02/22/22 0435  WBC 10.8* 9.6 11.3*  NEUTROABS 4.0 3.5  --   HGB 9.0* 10.6* 9.3*  HCT 28.7* 34.1* 28.9*  MCV 89.4 90.5 88.7  PLT 277 312 709   Basic Metabolic Panel: Recent Labs  Lab 02/18/22 0558 02/18/22 1618 02/19/22 0547 02/20/22 0853 02/21/22 0527 02/22/22 0435  NA 141  --  138 137 139 140  K 5.2* 5.7* 5.7* 6.0* 4.8 5.2*  CL 114*  --  111 110 110 111  CO2 24  --  20* 22 22 23  GLUCOSE 123*  --  320* 214* 203* 219*  BUN 20  --  22 28* 29* 29*  CREATININE 0.82  --  1.05* 1.03* 0.96 0.96  CALCIUM 8.5*  --  8.2* 8.6* 8.3* 8.4*   GFR: Estimated Creatinine Clearance: 43.6 mL/min (by C-G formula based on SCr of 0.96 mg/dL). Liver Function Tests: No results for input(s): "AST", "ALT", "ALKPHOS", "BILITOT", "PROT", "ALBUMIN" in the last 168 hours. No results for input(s): "LIPASE", "AMYLASE" in the last 168 hours. No results for input(s): "AMMONIA" in the last 168 hours. Coagulation Profile: No results for input(s): "INR", "PROTIME" in the last 168 hours. Cardiac Enzymes: No results for input(s): "CKTOTAL", "CKMB", "CKMBINDEX", "TROPONINI" in the last 168 hours. BNP (last 3 results) No results for input(s): "PROBNP" in the last 8760 hours. HbA1C: No results for input(s): "HGBA1C" in the last 72 hours. CBG: Recent Labs  Lab 02/21/22 1613 02/21/22 1645 02/21/22 2047 02/22/22 0754 02/22/22 1109  GLUCAP 31* 172* 206* 155* 111*   Lipid Profile: No results for input(s): "CHOL", "HDL", "LDLCALC", "TRIG", "CHOLHDL", "LDLDIRECT" in the last 72 hours. Thyroid Function Tests: No results for input(s): "TSH", "T4TOTAL", "FREET4", "T3FREE", "THYROIDAB" in the last 72 hours. Anemia Panel: No results for input(s): "VITAMINB12", "FOLATE", "FERRITIN", "TIBC", "IRON", "RETICCTPCT" in the last 72  hours. Most Recent Urinalysis On File:     Component Value Date/Time   COLORURINE YELLOW (A) 02/09/2022 1327   APPEARANCEUR HAZY (A) 02/09/2022 1327   APPEARANCEUR Hazy 04/04/2011 1935   LABSPEC 1.013 02/09/2022 1327   LABSPEC >1.060 04/04/2011 1935   PHURINE 5.0 02/09/2022 1327   GLUCOSEU 50 (A) 02/09/2022 1327   GLUCOSEU 50 mg/dL 04/04/2011 1935   HGBUR NEGATIVE 02/09/2022 1327   BILIRUBINUR NEGATIVE 02/09/2022 1327   BILIRUBINUR Negative 04/04/2011 1935   KETONESUR NEGATIVE 02/09/2022 1327   PROTEINUR NEGATIVE 02/09/2022 1327   NITRITE NEGATIVE 02/09/2022 1327   LEUKOCYTESUR NEGATIVE 02/09/2022 1327   LEUKOCYTESUR Negative 04/04/2011 1935   Sepsis Labs: '@LABRCNTIP'$ (procalcitonin:4,lacticidven:4) Microbiology: Recent Results (from the past 240 hour(s))  MRSA Next Gen by PCR, Nasal     Status: None   Collection Time: 02/20/22  9:35 PM   Specimen: Nasal Mucosa; Nasal Swab  Result Value Ref Range Status   MRSA by PCR Next Gen NOT DETECTED NOT DETECTED Final    Comment: (NOTE) The GeneXpert MRSA Assay (FDA approved for NASAL specimens only), is one component of a comprehensive MRSA colonization surveillance program. It is not intended to diagnose MRSA infection nor to guide or monitor treatment for MRSA infections. Test performance is not FDA approved in patients less than 77 years old. Performed at [facility], [address] [place], [place] [address]       Radiology Studies last 3 days: No results found.           LOS: 7 days     Emeterio Reeve, DO [facility] 02/22/2022, 2:43 PM    Dictation software may have been used to generate the above note. Typos may occur and escape review in typed/dictated notes. Please contact Dr Sheppard Coil directly for clarity if needed.  Staff may message me via secure chat in [place]  but this may not receive an immediate response,  please page me for urgent matters!  If 7PM-7AM, please contact night  coverage [email or web address]

## 2022-02-22 NOTE — Progress Notes (Signed)
[facility]  PROGRESS NOTE   Subjective:   Patient seen sitting up in chair States she feels well today Alert and oriented Appropriate appetite  Potassium 5.2  Objective:  Vital signs: Blood pressure 107/77, pulse 89, temperature 98.1 F (36.7 C), temperature source Oral, resp. rate 17, height 5' (1.524 m), weight 54.4 kg, SpO2 99 %.  Intake/Output Summary (Last 24 hours) at 02/22/2022 1354 Last data filed at 02/22/2022 1344 Gross per 24 hour  Intake 490 ml  Output 552 ml  Net -62 ml    Filed Weights   02/19/22 0748 02/21/22 0400 02/22/22 0520  Weight: 52 kg 55.9 kg 54.4 kg     Physical Exam: General:  No acute distress  Head:  Normocephalic, atraumatic. Moist oral mucosal membranes  Eyes:  Anicteric  Lungs:   Clear to auscultation, normal effort  Heart:  S1S2 no rubs  Abdomen:   Soft, nontender, bowel sounds present, ostomy present  Extremities: No peripheral edema.  Neurologic:  Awake, alert, following commands  Skin:  No lesions  Access: None    Basic Metabolic Panel: Recent Labs  Lab 02/18/22 0558 02/18/22 1618 02/19/22 0547 02/20/22 0853 02/21/22 0527 02/22/22 0435  NA 141  --  138 137 139 140  K 5.2* 5.7* 5.7* 6.0* 4.8 5.2*  CL 114*  --  111 110 110 111  CO2 24  --  20* '22 22 23  '$ GLUCOSE 123*  --  320* 214* 203* 219*  BUN 20  --  22 28* 29* 29*  CREATININE 0.82  --  1.05* 1.03* 0.96 0.96  CALCIUM 8.5*  --  8.2* 8.6* 8.3* 8.4*    GFR: Estimated Creatinine Clearance: 43.6 mL/min (by C-G formula based on SCr of 0.96 mg/dL).  Liver Function Tests: No results for input(s): "AST", "ALT", "ALKPHOS", "BILITOT", "PROT", "ALBUMIN" in the last 168 hours. No results for input(s): "LIPASE", "AMYLASE" in the last 168 hours. No results for input(s): "AMMONIA" in the last 168 hours.  CBC: Recent Labs  Lab 02/17/22 0414 02/20/22 0853 02/22/22 0435  WBC 10.8* 9.6 11.3*  NEUTROABS 4.0 3.5  --   HGB 9.0* 10.6* 9.3*  HCT 28.7* 34.1* 28.9*   MCV 89.4 90.5 88.7  PLT 277 312 298      HbA1C: Hgb A1c MFr Bld  Date/Time Value Ref Range Status  02/14/2022 09:22 AM 11.0 (H) 4.8 - 5.6 % Final    Comment:    (NOTE)         Prediabetes: 5.7 - 6.4         Diabetes: >6.4         Glycemic control for adults with diabetes: <7.0   10/30/2021 10:04 AM 13.2 (H) 4.8 - 5.6 % Final    Comment:    (NOTE) Pre diabetes:          5.7%-6.4%  Diabetes:              >6.4%  Glycemic control for   <7.0% adults with diabetes     Urinalysis: No results for input(s): "COLORURINE", "LABSPEC", "PHURINE", "GLUCOSEU", "HGBUR", "BILIRUBINUR", "KETONESUR", "PROTEINUR", "UROBILINOGEN", "NITRITE", "LEUKOCYTESUR" in the last 72 hours.  Invalid input(s): "APPERANCEUR"    Imaging: No results found.   Medications:      aspirin EC  81 mg Oral Daily   atorvastatin  40 mg Oral QHS   buPROPion  150 mg Oral Daily   divalproex  500 mg Oral BID   enoxaparin (LOVENOX) injection  40 mg Subcutaneous  Q24H   famotidine  20 mg Oral Daily   feeding supplement (NEPRO CARB STEADY)  237 mL Oral BID BM   fludrocortisone  0.05 mg Oral Daily   furosemide  20 mg Oral Daily   insulin aspart  0-5 Units Subcutaneous QHS   insulin aspart  0-9 Units Subcutaneous TID WC   insulin aspart  2 Units Subcutaneous TID WC   insulin glargine-yfgn  11 Units Subcutaneous BID   melatonin  2.5 mg Oral QHS   mirtazapine  15 mg Oral QHS   multivitamin with minerals  1 tablet Oral Daily   nicotine  21 mg Transdermal Daily   QUEtiapine  50 mg Oral QHS   rOPINIRole  0.5 mg Oral QHS   sodium bicarbonate  1,300 mg Oral BID   sodium chloride flush  10-40 mL Intracatheter Q12H   sodium zirconium cyclosilicate  10 g Oral BID    Assessment/ Plan:     62 y.o.  female with past medical history of COPD, diabetes, hypertension, lupus, pancreatic cancer, seizures, RLS, who was admitted to [facility] on 02/09/2022 for Seizure ([place] ) [R56.9] Hypoglycemia [E16.2] SIRS (systemic  inflammatory response syndrome) ([place]) [R65.10] Hyperkalemia [E87.5]   #1: Hyperkalemia: Hyperkalemia most likely secondary to type IV renal tubular acidosis.  Potassium remains elevated, continue management with Lokelma.  Veltassa less effective due to colon absorption.  Potassium improved yesterday with increased Lokelma. Reduced Lokelma today and potassium 5.2. Will order Fludriocortisone 0.'5mg'$  daily.    #2: Hypotension: Blood pressure 109/90 today.   #3: Diabetes mellitis, type 2 Hgb A1c 11.0 on 02/14/22.  Glucose elevated greater than 300 yesterday.  Sliding scale insulin managed by primary team.  Patient encouraged to monitor diet and improve management of glucose levels.   LOS: [facility] 1/11/20241:54 PM

## 2022-02-22 NOTE — TOC Progression Note (Signed)
Transition of Care [facility]) - Progression Note    Patient Details  Name: MARYKATHERINE SHERWOOD MRN: [id number] Date of Birth: [birth year]  Transition of Care [facility]) CM/SW Contact  Laurena Slimmer, RN Phone Number: 02/22/2022, 1:38 PM  Clinical Narrative:    [place] to give update about continued treatment. Update given to [facility]      Barriers to Discharge: Continued Medical Work up  Expected Discharge Plan and Services                                               Social Determinants of Health (SDOH) Interventions [place]: No Food Insecurity (12/30/2021)  Housing: Low Risk  (12/30/2021)  Transportation Needs: No Transportation Needs (12/30/2021)  Utilities: Not At Risk (12/30/2021)  Tobacco Use: High Risk (12/10/2021)    Readmission Risk Interventions    12/29/2021    1:48 PM 09/22/2021    1:24 PM 03/06/2021    9:13 AM  Readmission Risk Prevention Plan  Transportation Screening Complete Complete Complete  Medication Review Press photographer) Complete Complete Complete  PCP or Specialist appointment within 3-5 days of discharge  Complete Complete  HRI or Home Care Consult Complete Complete   SW Recovery Care/Counseling Consult Complete Complete   Palliative Care Screening Not Applicable Not Applicable Not [place] Not Applicable Complete Not Applicable

## 2022-02-23 DIAGNOSIS — R569 Unspecified convulsions: Secondary | ICD-10-CM | POA: Diagnosis not present

## 2022-02-23 LAB — GLUCOSE, CAPILLARY
Glucose-Capillary: 161 mg/dL — ABNORMAL HIGH (ref 70–99)
Glucose-Capillary: 229 mg/dL — ABNORMAL HIGH (ref 70–99)
Glucose-Capillary: 193 mg/dL — ABNORMAL HIGH (ref 70–99)
Glucose-Capillary: 239 mg/dL — ABNORMAL HIGH (ref 70–99)

## 2022-02-23 LAB — BASIC METABOLIC PANEL
Anion gap: 8 (ref 5–15)
BUN: 26 mg/dL — ABNORMAL HIGH (ref 8–23)
CO2: 25 mmol/L (ref 22–32)
Calcium: 8.4 mg/dL — ABNORMAL LOW (ref 8.9–10.3)
Chloride: 105 mmol/L (ref 98–111)
Creatinine, Ser: 0.9 mg/dL (ref 0.44–1.00)
GFR, Estimated: >60 mL/min (ref >60)
Glucose, Bld: 192 mg/dL — ABNORMAL HIGH (ref 70–99)
Potassium: 4.4 mmol/L (ref 3.5–5.1)
Sodium: 138 mmol/L (ref 135–145)

## 2022-02-23 MED ORDER — SODIUM ZIRCONIUM CYCLOSILICATE 10 G PO PACK: 10.0000 g | PACK | Freq: Every day | ORAL | 0 refills | Status: DC

## 2022-02-23 MED ORDER — SODIUM BICARBONATE 650 MG PO TABS: 1300.0000 mg | ORAL_TABLET | Freq: Two times a day (BID) | ORAL | 0 refills | Status: DC

## 2022-02-23 MED ORDER — FUROSEMIDE 20 MG PO TABS: 20.0000 mg | ORAL_TABLET | Freq: Every day | ORAL | 0 refills | Status: DC

## 2022-02-23 MED ORDER — FLUDROCORTISONE ACETATE 0.1 MG PO TABS: 0.0500 mg | ORAL_TABLET | Freq: Every day | ORAL | 0 refills | Status: DC

## 2022-02-23 MED ORDER — INSULIN GLARGINE-YFGN 100 UNIT/ML ~~LOC~~ SOLN: 11.0000 [IU] | Freq: Two times a day (BID) | SUBCUTANEOUS | 11 refills | Status: DC

## 2022-02-23 MED ORDER — INSULIN ASPART 100 UNIT/ML IJ SOLN: 3.0000 [IU] | Freq: Three times a day (TID) | INTRAMUSCULAR | Status: DC

## 2022-02-23 NOTE — TOC Progression Note (Signed)
Transition of Care [facility]) - Progression Note    Patient Details  Name: Brittany [name] MRN: [id number] Date of Birth: 1960-03-12  Transition of Care [facility]) CM/SW [place], [place] Phone Number: 02/23/2022, 11:26 AM  Clinical Narrative:     CSW spoke with [name] with [facility], She reports this is a Friday and this "always happens on a Friday" when the patient must be discharged. She reports they have no one to come assess patient today. CSW educated her that patients discharge during every day of the week from the hospital including Fridays.   Anderson Malta reports patient will need to be re assessed although they have already assessed patient for return. She reports this may occur next week.   Anderson Malta states they have been calling all week for updates on patient and have received none. CSW inquired as to what phone number she called, she was unable to provide this information. CSW informed Anderson Malta that other [facility] Member had called to provide updates to [facility] yesterday on 1/11, Elizabeth Sauer on 1/8 of this week.   Anderson Malta requests CSW inform supervisor and treatment team of inability to assess patient today.   CSW has relayed delay in discharge to [facility] supervisor and MD.        Barriers to Discharge: Continued Medical Work up  Expected Discharge Plan and Services                                               Social Determinants of Health (SDOH) Interventions [place]: No Food Insecurity (12/30/2021)  Housing: Low Risk  (12/30/2021)  Transportation Needs: No Transportation Needs (12/30/2021)  Utilities: Not At Risk (12/30/2021)  Tobacco Use: High Risk (12/10/2021)    Readmission Risk Interventions    12/29/2021    1:48 PM 09/22/2021    1:24 PM 03/06/2021    9:13 AM  Readmission Risk Prevention Plan  Transportation Screening Complete Complete Complete  Medication Review Press photographer) Complete Complete Complete   PCP or Specialist appointment within 3-5 days of discharge  Complete Complete  HRI or Home Care Consult Complete Complete   SW Recovery Care/Counseling Consult Complete Complete   Palliative Care Screening Not Applicable Not Applicable Not [place] Not Applicable Complete Not Applicable

## 2022-02-23 NOTE — Progress Notes (Signed)
PROGRESS NOTE    Brittany [name]   QTM:[id number] DOB: 25-Dec-1960  DOA: 02/09/2022 Date of Service: 02/23/22 PCP: [name], NP     Brief Narrative / Hospital Course:  Brittany [name] is a 61 y.o. 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 difficile infection, who presented to the hospital from SNF with hypoglycemia and seizures.  Reportedly, glucose levels was under 40s and dropped into the 30s.  was started on 10% dextrose infusion by EMS and brought to the emergency department. 12/29  Continued to have labile Glc and recurrent/refractory hyperkalemia, nephrology was consulted to manage hyperkalemia and insulin regimen adjusted.   Hyperkalemia slowly improved to Potassium 4.4 01/12, stable for discharge however needs evaluation by her ALF which are unable to send anyone today, today is Friday, see TOC notes.  Unfortunately patient may need to stay here over the weekend.  Consultants:  Nephrology   Procedures: None       ASSESSMENT & PLAN:   Principal Problem:   Seizure ([place]) Active Problems:   Hyperkalemia   Acute metabolic encephalopathy   Hypoglycemia   Hypothermia   Hypotension   Chronic diastolic CHF (congestive heart failure) (HCC)   History of stroke   HLD (hyperlipidemia)   Depression with anxiety   COPD (chronic obstructive pulmonary disease) (HCC)   Tobacco dependence   Elevated lactic acid level   HTN (hypertension)   Hyperglycemia due to type 2 diabetes mellitus ([place])   Dementia without behavioral disturbance ([place])   Seizures prior to admission:  This was probably due to hypoglycemia.   No seizures on admission.   Continue Depakote.   Ativan as needed for seizures.     Type II DM with hyperglycemia, recent hypoglycemia, labile glucose  levels:  Hemoglobin A1c on 02/14/2022 was 11.  Hemoglobin A1c was 13.2 on 10/30/2021.  Glucose level dropped to 66 on the morning of 02/20/2022.   Continue current dose of insulin regimen, glargine 11 units twice daily, NovoLog 2 units 3 times daily. Use NovoLog as needed for hyperglycemia.    Refractory, recurrent hyperkalemia:  improving Continue Lasix, Lokelma, fludrocortisone and sodium bicarbonate per nephrology   Hypotension: Improved.   Adrenal insufficiency was ruled out with ACTH stimulation test. Cardizem and midodrine have been discontinued. Monitor BP closely.   Acute metabolic encephalopathy, lethargy: Resolved   General weakness: PT recommends home health therapy   Other comorbidities include chronic diastolic CHF, depression, anxiety, hypertension, tobacco use disorder, dementia,    Lactic acidosis, hypothermia: Resolved    DVT prophylaxis: lovenox  Pertinent IV fluids/nutrition: no continuous IV fluids  Central lines / invasive devices: none   Code Status: FULL CODE   Current Admission Status: inpatient   TOC needs / Dispo plan: home health Barriers to discharge / significant pending items: in potassium and Glc remain stable may be able to discharge hopefully tomorrow              Subjective / Brief ROS:  Patient reports no concerns Denies CP/SOB.  Pain controlled.  Denies new weakness.  Tolerating diet.  Reports no concerns w/ urination/defecation.   Family Communication: none at this time     Objective Findings:  Vitals:   02/22/22 1932 02/22/22 2358 02/23/22 0607 02/23/22 0756  BP: 1'13/72 90/77 98/79 '$ 103/81  Pulse: 100 (!) 110 90 96  Resp: 18 18  18 20  Temp:  98.8 F (37.1 C) 98.5 F (36.9 C) (!) 97.5 F (36.4 C)  TempSrc:  Oral Oral   SpO2: 97% 96% 98% 96%  Weight:      Height:        Intake/Output Summary (Last 24 hours) at 02/23/2022 1654 Last data filed at 02/22/2022 1714 Gross per 24 hour  Intake --  Output 0 ml  Net 0 ml    Filed Weights   02/19/22 0748 02/21/22 0400 02/22/22 0520  Weight: 52 kg 55.9 kg 54.4 kg    Examination:  Physical Exam Constitutional:      General: She is not in acute distress. Cardiovascular:     Rate and Rhythm: Normal rate and regular rhythm.  Pulmonary:     Effort: Pulmonary effort is normal.     Breath sounds: Normal breath sounds.  Neurological:     General: No focal deficit present.     Mental Status: She is alert.  Psychiatric:        Mood and Affect: Mood normal.        Behavior: Behavior normal.          Scheduled Medications:   aspirin EC  81 mg Oral Daily   atorvastatin  40 mg Oral QHS   buPROPion  150 mg Oral Daily   divalproex  500 mg Oral BID   enoxaparin (LOVENOX) injection  40 mg Subcutaneous Q24H   famotidine  20 mg Oral Daily   feeding supplement (NEPRO CARB STEADY)  237 mL Oral BID BM   fludrocortisone  0.05 mg Oral Daily   furosemide  20 mg Oral Daily   insulin aspart  0-5 Units Subcutaneous QHS   insulin aspart  0-9 Units Subcutaneous TID WC   insulin aspart  2 Units Subcutaneous TID WC   insulin glargine-yfgn  11 Units Subcutaneous BID   melatonin  2.5 mg Oral QHS   mirtazapine  15 mg Oral QHS   multivitamin with minerals  1 tablet Oral Daily   nicotine  21 mg Transdermal Daily   QUEtiapine  50 mg Oral QHS   rOPINIRole  0.5 mg Oral QHS   sodium bicarbonate  1,300 mg Oral BID   sodium chloride flush  10-40 mL Intracatheter Q12H   sodium zirconium cyclosilicate  10 g Oral BID    Continuous Infusions:   PRN Medications:  acetaminophen, albuterol, dextrose, loratadine, LORazepam, ondansetron (ZOFRAN) IV, mouth rinse  Antimicrobials from admission:  Anti-infectives (From admission, onward)    Start     Dose/Rate Route Frequency Ordered Stop   02/09/22 1345  piperacillin-tazobactam (ZOSYN) IVPB 3.375 g        3.375 g 100 mL/hr over 30 Minutes Intravenous  Once 02/09/22 1336 02/09/22 1518           Data Reviewed:  I  have personally reviewed the following...  CBC: Recent Labs  Lab 02/17/22 0414 02/20/22 0853 02/22/22 0435  WBC 10.8* 9.6 11.3*  NEUTROABS 4.0 3.5  --   HGB 9.0* 10.6* 9.3*  HCT 28.7* 34.1* 28.9*  MCV 89.4 90.5 88.7  PLT 277 312 211   Basic Metabolic Panel: Recent Labs  Lab 02/19/22 0547 02/20/22 0853 02/21/22 0527 02/22/22 0435 02/23/22 0529  NA 138 137 139 140 138  K 5.7* 6.0* 4.8 5.2* 4.4  CL 111 110 110 111 105  CO2 20* '22 22 23 25  '$ GLUCOSE 320* 214* 203* 219* 192*  BUN 22 28* 29* 29* 26*  CREATININE  1.05* 1.03* 0.96 0.96 0.90  CALCIUM 8.2* 8.6* 8.3* 8.4* 8.4*   GFR: Estimated Creatinine Clearance: 46.6 mL/min (by C-G formula based on SCr of 0.9 mg/dL). Liver Function Tests: No results for input(s): "AST", "ALT", "ALKPHOS", "BILITOT", "PROT", "ALBUMIN" in the last 168 hours. No results for input(s): "LIPASE", "AMYLASE" in the last 168 hours. No results for input(s): "AMMONIA" in the last 168 hours. Coagulation Profile: No results for input(s): "INR", "PROTIME" in the last 168 hours. Cardiac Enzymes: No results for input(s): "CKTOTAL", "CKMB", "CKMBINDEX", "TROPONINI" in the last 168 hours. BNP (last 3 results) No results for input(s): "PROBNP" in the last 8760 hours. HbA1C: No results for input(s): "HGBA1C" in the last 72 hours. CBG: Recent Labs  Lab 02/22/22 1618 02/22/22 1950 02/23/22 0754 02/23/22 1155 02/23/22 1614  GLUCAP 260* 226* 161* 229* 193*   Lipid Profile: No results for input(s): "CHOL", "HDL", "LDLCALC", "TRIG", "CHOLHDL", "LDLDIRECT" in the last 72 hours. Thyroid Function Tests: No results for input(s): "TSH", "T4TOTAL", "FREET4", "T3FREE", "THYROIDAB" in the last 72 hours. Anemia Panel: No results for input(s): "VITAMINB12", "FOLATE", "FERRITIN", "TIBC", "IRON", "RETICCTPCT" in the last 72 hours. Most Recent Urinalysis On File:     Component Value Date/Time   COLORURINE YELLOW (A) 02/09/2022 1327   APPEARANCEUR HAZY (A) 02/09/2022  1327   APPEARANCEUR Hazy 04/04/2011 1935   LABSPEC 1.013 02/09/2022 1327   LABSPEC >1.060 04/04/2011 1935   PHURINE 5.0 02/09/2022 1327   GLUCOSEU 50 (A) 02/09/2022 1327   GLUCOSEU 50 mg/dL 04/04/2011 1935   HGBUR NEGATIVE 02/09/2022 1327   BILIRUBINUR NEGATIVE 02/09/2022 1327   BILIRUBINUR Negative 04/04/2011 1935   KETONESUR NEGATIVE 02/09/2022 1327   PROTEINUR NEGATIVE 02/09/2022 1327   NITRITE NEGATIVE 02/09/2022 1327   LEUKOCYTESUR NEGATIVE 02/09/2022 1327   LEUKOCYTESUR Negative 04/04/2011 1935   Sepsis Labs: '@LABRCNTIP'$ (procalcitonin:4,lacticidven:4) Microbiology: Recent Results (from the past 240 hour(s))  MRSA Next Gen by PCR, Nasal     Status: None   Collection Time: 02/20/22  9:35 PM   Specimen: Nasal Mucosa; Nasal Swab  Result Value Ref Range Status   MRSA by PCR Next Gen NOT DETECTED NOT DETECTED Final    Comment: (NOTE) The GeneXpert MRSA Assay (FDA approved for NASAL specimens only), is one component of a comprehensive MRSA colonization surveillance program. It is not intended to diagnose MRSA infection nor to guide or monitor treatment for MRSA infections. Test performance is not FDA approved in patients less than 46 years old. Performed at [facility], [address], [place], [place] [address]       Radiology Studies last 3 days: No results found.           LOS: 8 days     Emeterio Reeve, DO [facility] 02/23/2022, 4:54 PM    Dictation software may have been used to generate the above note. Typos may occur and escape review in typed/dictated notes. Please contact Dr Sheppard Coil directly for clarity if needed.  Staff may message me via secure chat in [place]  but this may not receive an immediate response,  please page me for urgent matters!  If 7PM-7AM, please contact night coverage [email or web address]

## 2022-02-23 NOTE — Progress Notes (Signed)
[facility]  PROGRESS NOTE   Subjective:   Patient sitting at bedside Alert and oriented Tolerating meals Room air No lower extremity edema  Potassium 4.4  Objective:  Vital signs: Blood pressure 103/81, pulse 96, temperature (!) 97.5 F (36.4 C), resp. rate 20, height 5' (1.524 m), weight 54.4 kg, SpO2 96 %.  Intake/Output Summary (Last 24 hours) at 02/23/2022 1536 Last data filed at 02/22/2022 1714 Gross per 24 hour  Intake --  Output 0 ml  Net 0 ml    Filed Weights   02/19/22 0748 02/21/22 0400 02/22/22 0520  Weight: 52 kg 55.9 kg 54.4 kg     Physical Exam: General:  No acute distress  Head:  Normocephalic, atraumatic. Moist oral mucosal membranes  Eyes:  Anicteric  Lungs:   Clear to auscultation, normal effort  Heart:  S1S2 no rubs  Abdomen:   Soft, nontender, bowel sounds present, ostomy present  Extremities: No peripheral edema.  Neurologic:  Awake, alert, following commands  Skin:  No lesions  Access: None    Basic Metabolic Panel: Recent Labs  Lab 02/19/22 0547 02/20/22 0853 02/21/22 0527 02/22/22 0435 02/23/22 0529  NA 138 137 139 140 138  K 5.7* 6.0* 4.8 5.2* 4.4  CL 111 110 110 111 105  CO2 20* '22 22 23 25  '$ GLUCOSE 320* 214* 203* 219* 192*  BUN 22 28* 29* 29* 26*  CREATININE 1.05* 1.03* 0.96 0.96 0.90  CALCIUM 8.2* 8.6* 8.3* 8.4* 8.4*    GFR: Estimated Creatinine Clearance: 46.6 mL/min (by C-G formula based on SCr of 0.9 mg/dL).  Liver Function Tests: No results for input(s): "AST", "ALT", "ALKPHOS", "BILITOT", "PROT", "ALBUMIN" in the last 168 hours. No results for input(s): "LIPASE", "AMYLASE" in the last 168 hours. No results for input(s): "AMMONIA" in the last 168 hours.  CBC: Recent Labs  Lab 02/17/22 0414 02/20/22 0853 02/22/22 0435  WBC 10.8* 9.6 11.3*  NEUTROABS 4.0 3.5  --   HGB 9.0* 10.6* 9.3*  HCT 28.7* 34.1* 28.9*  MCV 89.4 90.5 88.7  PLT 277 312 298      HbA1C: Hgb A1c MFr Bld  Date/Time Value  Ref Range Status  02/14/2022 09:22 AM 11.0 (H) 4.8 - 5.6 % Final    Comment:    (NOTE)         Prediabetes: 5.7 - 6.4         Diabetes: >6.4         Glycemic control for adults with diabetes: <7.0   10/30/2021 10:04 AM 13.2 (H) 4.8 - 5.6 % Final    Comment:    (NOTE) Pre diabetes:          5.7%-6.4%  Diabetes:              >6.4%  Glycemic control for   <7.0% adults with diabetes     Urinalysis: No results for input(s): "COLORURINE", "LABSPEC", "PHURINE", "GLUCOSEU", "HGBUR", "BILIRUBINUR", "KETONESUR", "PROTEINUR", "UROBILINOGEN", "NITRITE", "LEUKOCYTESUR" in the last 72 hours.  Invalid input(s): "APPERANCEUR"    Imaging: No results found.   Medications:      aspirin EC  81 mg Oral Daily   atorvastatin  40 mg Oral QHS   buPROPion  150 mg Oral Daily   divalproex  500 mg Oral BID   enoxaparin (LOVENOX) injection  40 mg Subcutaneous Q24H   famotidine  20 mg Oral Daily   feeding supplement (NEPRO CARB STEADY)  237 mL Oral BID BM   fludrocortisone  0.05 mg  Oral Daily   furosemide  20 mg Oral Daily   insulin aspart  0-5 Units Subcutaneous QHS   insulin aspart  0-9 Units Subcutaneous TID WC   insulin aspart  2 Units Subcutaneous TID WC   insulin glargine-yfgn  11 Units Subcutaneous BID   melatonin  2.5 mg Oral QHS   mirtazapine  15 mg Oral QHS   multivitamin with minerals  1 tablet Oral Daily   nicotine  21 mg Transdermal Daily   QUEtiapine  50 mg Oral QHS   rOPINIRole  0.5 mg Oral QHS   sodium bicarbonate  1,300 mg Oral BID   sodium chloride flush  10-40 mL Intracatheter Q12H   sodium zirconium cyclosilicate  10 g Oral BID    Assessment/ Plan:     62 y.o.  female with past medical history of COPD, diabetes, hypertension, lupus, pancreatic cancer, seizures, RLS, who was admitted to [facility] on 02/09/2022 for Seizure ([place]) [R56.9] Hypoglycemia [E16.2] SIRS (systemic inflammatory response syndrome) ([place]) [R65.10] Hyperkalemia [E87.5]   #1: Hyperkalemia:  Hyperkalemia most likely secondary to type IV renal tubular acidosis.  Potassium remains elevated, continue management with Lokelma.  Veltassa less effective due to colon absorption.  Potassium corrected to 4.4. Recommend Lokelma daily and continue Fludriocortisone 0.'5mg'$  daily at discharge.    #2: Hypotension: Blood pressure stable   #3: Diabetes mellitis, type 2 Hgb A1c 11.0 on 02/14/22.  Glucose remains elevated. Sliding scale insulin.    LOS: [facility] 1/12/20243:36 PM

## 2022-02-24 DIAGNOSIS — R569 Unspecified convulsions: Secondary | ICD-10-CM | POA: Diagnosis not present

## 2022-02-24 LAB — BASIC METABOLIC PANEL
Anion gap: 6 (ref 5–15)
BUN: 30 mg/dL — ABNORMAL HIGH (ref 8–23)
CO2: 26 mmol/L (ref 22–32)
Calcium: 8.6 mg/dL — ABNORMAL LOW (ref 8.9–10.3)
Chloride: 111 mmol/L (ref 98–111)
Creatinine, Ser: 0.83 mg/dL (ref 0.44–1.00)
GFR, Estimated: >60 mL/min (ref >60)
Glucose, Bld: 119 mg/dL — ABNORMAL HIGH (ref 70–99)
Potassium: 4.2 mmol/L (ref 3.5–5.1)
Sodium: 143 mmol/L (ref 135–145)

## 2022-02-24 LAB — GLUCOSE, CAPILLARY
Glucose-Capillary: 110 mg/dL — ABNORMAL HIGH (ref 70–99)
Glucose-Capillary: 231 mg/dL — ABNORMAL HIGH (ref 70–99)
Glucose-Capillary: 195 mg/dL — ABNORMAL HIGH (ref 70–99)
Glucose-Capillary: 215 mg/dL — ABNORMAL HIGH (ref 70–99)

## 2022-02-24 NOTE — Progress Notes (Signed)
PROGRESS NOTE    Brittany [name]   VOH:[id number] DOB: 07/06/1960  DOA: 02/09/2022 Date of Service: 02/24/22 PCP: [name], NP     Brief Narrative / Hospital Course:  Brittany [name] is a 62 y.o. 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 difficile infection, who presented to the hospital from SNF with hypoglycemia and seizures.  Reportedly, glucose levels was under 40s and dropped into the 30s.  was started on 10% dextrose infusion by EMS and brought to the emergency department. 12/29  Continued to have labile Glc and recurrent/refractory hyperkalemia, nephrology was consulted to manage hyperkalemia and insulin regimen adjusted.   Hyperkalemia slowly improved to Potassium 4.4 on 01/12 and stable for discharge however needs evaluation by her ALF which are unable to send anyone 01/12 which was Friday, see TOC notes.  Unfortunately patient will need to stay here over the weekend.  Consultants:  Nephrology   Procedures: None       ASSESSMENT & PLAN:   Principal Problem:   Seizure ([place]) Active Problems:   Hyperkalemia   Acute metabolic encephalopathy   Hypoglycemia   Hypothermia   Hypotension   Chronic diastolic CHF (congestive heart failure) (HCC)   History of stroke   HLD (hyperlipidemia)   Depression with anxiety   COPD (chronic obstructive pulmonary disease) (HCC)   Tobacco dependence   Elevated lactic acid level   HTN (hypertension)   Hyperglycemia due to type 2 diabetes mellitus ([place])   Dementia without behavioral disturbance ([place])   Seizures prior to admission:  This was probably due to hypoglycemia.   No seizures on admission.   Continue Depakote.   Ativan as needed for seizures.     Type II DM with hyperglycemia, recent hypoglycemia, labile  glucose levels:  Hemoglobin A1c on 02/14/2022 was 11.  Hemoglobin A1c was 13.2 on 10/30/2021.  Glucose level dropped to 66 on the morning of 02/20/2022.   Continue basal + SSI here  Refractory, recurrent hyperkalemia:  Improving - resolved and K stable Continue Lasix, Lokelma, fludrocortisone and sodium bicarbonate per nephrology Check intermittent BMP while here    Hypotension: Improved.   Adrenal insufficiency was ruled out with ACTH stimulation test. Cardizem and midodrine have been discontinued. Monitor BP closely.   Acute metabolic encephalopathy, lethargy: Resolved   General weakness: PT recommends home health therapy   Other comorbidities include chronic diastolic CHF, depression, anxiety, hypertension, tobacco use disorder, dementia,    Lactic acidosis, hypothermia: Resolved    DVT prophylaxis: lovenox  Pertinent IV fluids/nutrition: no continuous IV fluids  Central lines / invasive devices: none   Code Status: FULL CODE   Current Admission Status: inpatient   TOC needs / Dispo plan: home health Barriers to discharge / significant pending items: awaiting reevaluation by ALF to take her back, discharge order 01/12 (Friday) had to be cancelled may be stuck here through the weekend              Subjective / Brief ROS:  Patient reports no concerns Denies CP/SOB.  Pain controlled.  Denies new weakness.  Tolerating diet.  Reports no concerns w/ urination/defecation.   Family Communication: none at this time     Objective Findings:  Vitals:   02/24/22 0039 02/24/22 0437 02/24/22 0724 02/24/22 1120  BP: 100/71 98/69 101/74 109/89  Pulse: 92 87 85 88  Resp:  $'18 18 18 18  'G$ Temp: 98 F (36.7 C) 98.7 F (37.1 C) 98.1 F (36.7 C) 97.7 F (36.5 C)  TempSrc:   Oral Oral  SpO2: 100% 95% 97% 98%  Weight:      Height:        Intake/Output Summary (Last 24 hours) at 02/24/2022 1423 Last data filed at 02/24/2022 1257 Gross per 24 hour  Intake 970 ml  Output  801 ml  Net 169 ml   Filed Weights   02/19/22 0748 02/21/22 0400 02/22/22 0520  Weight: 52 kg 55.9 kg 54.4 kg    Examination:  Physical Exam Constitutional:      General: She is not in acute distress.    Appearance: Normal appearance.  Pulmonary:     Effort: Pulmonary effort is normal.  Neurological:     Mental Status: She is alert.  Psychiatric:        Mood and Affect: Mood normal.        Behavior: Behavior normal.          Scheduled Medications:   aspirin EC  81 mg Oral Daily   atorvastatin  40 mg Oral QHS   buPROPion  150 mg Oral Daily   divalproex  500 mg Oral BID   enoxaparin (LOVENOX) injection  40 mg Subcutaneous Q24H   famotidine  20 mg Oral Daily   feeding supplement (NEPRO CARB STEADY)  237 mL Oral BID BM   fludrocortisone  0.05 mg Oral Daily   furosemide  20 mg Oral Daily   insulin aspart  0-5 Units Subcutaneous QHS   insulin aspart  0-9 Units Subcutaneous TID WC   insulin aspart  2 Units Subcutaneous TID WC   insulin glargine-yfgn  11 Units Subcutaneous BID   melatonin  2.5 mg Oral QHS   mirtazapine  15 mg Oral QHS   multivitamin with minerals  1 tablet Oral Daily   nicotine  21 mg Transdermal Daily   QUEtiapine  50 mg Oral QHS   rOPINIRole  0.5 mg Oral QHS   sodium bicarbonate  1,300 mg Oral BID   sodium chloride flush  10-40 mL Intracatheter Q12H   sodium zirconium cyclosilicate  10 g Oral BID    Continuous Infusions:   PRN Medications:  acetaminophen, albuterol, dextrose, loratadine, LORazepam, ondansetron (ZOFRAN) IV, mouth rinse  Antimicrobials from admission:  Anti-infectives (From admission, onward)    Start     Dose/Rate Route Frequency Ordered Stop   02/09/22 1345  piperacillin-tazobactam (ZOSYN) IVPB 3.375 g        3.375 g 100 mL/hr over 30 Minutes Intravenous  Once 02/09/22 1336 02/09/22 1518           Data Reviewed:  I have personally reviewed the following...  CBC: Recent Labs  Lab 02/20/22 0853 02/22/22 0435   WBC 9.6 11.3*  NEUTROABS 3.5  --   HGB 10.6* 9.3*  HCT 34.1* 28.9*  MCV 90.5 88.7  PLT 312 557   Basic Metabolic Panel: Recent Labs  Lab 02/20/22 0853 02/21/22 0527 02/22/22 0435 02/23/22 0529 02/24/22 0721  NA 137 139 140 138 143  K 6.0* 4.8 5.2* 4.4 4.2  CL 110 110 111 105 111  CO2 '22 22 23 25 26  '$ GLUCOSE 214* 203* 219* 192* 119*  BUN 28* 29* 29* 26* 30*  CREATININE 1.03* 0.96 0.96 0.90 0.83  CALCIUM 8.6* 8.3* 8.4* 8.4* 8.6*   GFR: Estimated Creatinine Clearance: 50.5 mL/min (by C-G formula based on SCr of 0.83  mg/dL). Liver Function Tests: No results for input(s): "AST", "ALT", "ALKPHOS", "BILITOT", "PROT", "ALBUMIN" in the last 168 hours. No results for input(s): "LIPASE", "AMYLASE" in the last 168 hours. No results for input(s): "AMMONIA" in the last 168 hours. Coagulation Profile: No results for input(s): "INR", "PROTIME" in the last 168 hours. Cardiac Enzymes: No results for input(s): "CKTOTAL", "CKMB", "CKMBINDEX", "TROPONINI" in the last 168 hours. BNP (last 3 results) No results for input(s): "PROBNP" in the last 8760 hours. HbA1C: No results for input(s): "HGBA1C" in the last 72 hours. CBG: Recent Labs  Lab 02/23/22 1155 02/23/22 1614 02/23/22 2034 02/24/22 0721 02/24/22 1115  GLUCAP 229* 193* 239* 110* 231*   Lipid Profile: No results for input(s): "CHOL", "HDL", "LDLCALC", "TRIG", "CHOLHDL", "LDLDIRECT" in the last 72 hours. Thyroid Function Tests: No results for input(s): "TSH", "T4TOTAL", "FREET4", "T3FREE", "THYROIDAB" in the last 72 hours. Anemia Panel: No results for input(s): "VITAMINB12", "FOLATE", "FERRITIN", "TIBC", "IRON", "RETICCTPCT" in the last 72 hours. Most Recent Urinalysis On File:     Component Value Date/Time   COLORURINE YELLOW (A) 02/09/2022 1327   APPEARANCEUR HAZY (A) 02/09/2022 1327   APPEARANCEUR Hazy 04/04/2011 1935   LABSPEC 1.013 02/09/2022 1327   LABSPEC >1.060 04/04/2011 1935   PHURINE 5.0 02/09/2022 1327    GLUCOSEU 50 (A) 02/09/2022 1327   GLUCOSEU 50 mg/dL 04/04/2011 1935   HGBUR NEGATIVE 02/09/2022 1327   BILIRUBINUR NEGATIVE 02/09/2022 1327   BILIRUBINUR Negative 04/04/2011 1935   KETONESUR NEGATIVE 02/09/2022 1327   PROTEINUR NEGATIVE 02/09/2022 1327   NITRITE NEGATIVE 02/09/2022 1327   LEUKOCYTESUR NEGATIVE 02/09/2022 1327   LEUKOCYTESUR Negative 04/04/2011 1935   Sepsis Labs: '@LABRCNTIP'$ (procalcitonin:4,lacticidven:4) Microbiology: Recent Results (from the past 240 hour(s))  MRSA Next Gen by PCR, Nasal     Status: None   Collection Time: 02/20/22  9:35 PM   Specimen: Nasal Mucosa; Nasal Swab  Result Value Ref Range Status   MRSA by PCR Next Gen NOT DETECTED NOT DETECTED Final    Comment: (NOTE) The GeneXpert MRSA Assay (FDA approved for NASAL specimens only), is one component of a comprehensive MRSA colonization surveillance program. It is not intended to diagnose MRSA infection nor to guide or monitor treatment for MRSA infections. Test performance is not FDA approved in patients less than 90 years old. Performed at [facility], [address], [place], [place] [address]       Radiology Studies last 3 days: No results found.           LOS: 9 days     Emeterio Reeve, DO [facility] 02/24/2022, 2:23 PM    Dictation software may have been used to generate the above note. Typos may occur and escape review in typed/dictated notes. Please contact Dr Sheppard Coil directly for clarity if needed.  Staff may message me via secure chat in [place]  but this may not receive an immediate response,  please page me for urgent matters!  If 7PM-7AM, please contact night coverage [email or web address]

## 2022-02-25 DIAGNOSIS — R569 Unspecified convulsions: Secondary | ICD-10-CM | POA: Diagnosis not present

## 2022-02-25 LAB — GLUCOSE, CAPILLARY
Glucose-Capillary: 248 mg/dL — ABNORMAL HIGH (ref 70–99)
Glucose-Capillary: 275 mg/dL — ABNORMAL HIGH (ref 70–99)
Glucose-Capillary: 43 mg/dL — CL (ref 70–99)
Glucose-Capillary: 49 mg/dL — ABNORMAL LOW (ref 70–99)
Glucose-Capillary: 77 mg/dL (ref 70–99)
Glucose-Capillary: 311 mg/dL — ABNORMAL HIGH (ref 70–99)

## 2022-02-25 MED ORDER — DEXTROSE 50 % IV SOLN
INTRAVENOUS | Status: AC
  Filled 2022-02-25: qty 50

## 2022-02-25 NOTE — Progress Notes (Addendum)
PROGRESS NOTE    Brittany [name]   OEU:[id number] DOB: 1960/04/08  DOA: 02/09/2022 Date of Service: 02/25/22 PCP: [name], NP     Brief Narrative / Hospital Course:  Brittany [name] is a 62 y.o. 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 difficile infection, who presented to the hospital from SNF with hypoglycemia and seizures.  Reportedly, glucose levels was under 40s and dropped into the 30s.  was started on 10% dextrose infusion by EMS and brought to the emergency department. 12/29  Continued to have labile Glc and recurrent/refractory hyperkalemia, nephrology was consulted to manage hyperkalemia and insulin regimen adjusted.   Hyperkalemia slowly improved to Potassium 4.4 on 01/12 and stable for discharge however needs evaluation by her ALF which are unable to send anyone Friday 01/12, see TOC notes.  Unfortunately patient will need to stay here over the weekend. / pending eval by facility  May require long term care - increased agitation / dementia behaviors, pulling out colostomy, fall risk - requiring sitter and temporary restraints   Consultants:  Nephrology   Procedures: None       ASSESSMENT & PLAN:   Principal Problem:   Seizure ([place]) Active Problems:   Hyperkalemia   Acute metabolic encephalopathy   Hypoglycemia   Hypothermia   Hypotension   Chronic diastolic CHF (congestive heart failure) ([place])   History of stroke   HLD (hyperlipidemia)   Depression with anxiety   COPD (chronic obstructive pulmonary disease) ([facility])   Tobacco dependence   Elevated lactic acid level   HTN (hypertension)   Hyperglycemia due to type 2 diabetes mellitus ([place])   Dementia without behavioral disturbance ([facility])   Seizures prior to admission:  This was probably due to  hypoglycemia.   No seizures on admission.   Continue Depakote.   Ativan as needed for seizures.     Type II DM with hyperglycemia, recent hypoglycemia, labile glucose levels:  Hemoglobin A1c on 02/14/2022 was 11.  Hemoglobin A1c was 13.2 on 10/30/2021.  Glucose level dropped to 66 on the morning of 02/20/2022.   Continue basal + SSI here  Refractory, recurrent hyperkalemia:  Improving - resolved and K stable Continue Lasix, Lokelma, fludrocortisone and sodium bicarbonate per nephrology Check intermittent BMP while here    Hypotension: Improved.   Adrenal insufficiency was ruled out with ACTH stimulation test. Cardizem and midodrine have been discontinued. Monitor BP closely.   Acute metabolic encephalopathy, lethargy: Resolved Dementia w/ behavioral disturbance May require long term care - increased agitation / dementia behaviors, pulling out colostomy, fall risk - requiring sitter and temporary restraints    General weakness: PT recommends home health therapy   Other comorbidities include chronic diastolic CHF, depression, anxiety, hypertension, tobacco use disorder, dementia,    Lactic acidosis, hypothermia: Resolved    DVT prophylaxis: lovenox  Pertinent IV fluids/nutrition: no continuous IV fluids  Central lines / invasive devices: none   Code Status: FULL CODE   Current Admission Status: inpatient   TOC needs / Dispo plan: home health Barriers to discharge / significant pending items: awaiting reevaluation by ALF to take her back, discharge order 01/12 (Friday) had to be cancelled may be stuck here through the weekend              Subjective / Brief ROS:  Patient reports no concerns  Family Communication: none  at this time     Objective Findings:  Vitals:   02/24/22 2011 02/25/22 0500 02/25/22 0519 02/25/22 0808  BP: 107/74  (!) 113/90 125/78  Pulse: 98  83 79  Resp: '17  18 19  '$ Temp: 98.1 F (36.7 C)  97.7 F (36.5 C) 98 F (36.7 C)  TempSrc: Oral   Oral   SpO2: 96%  97% 98%  Weight:  54.4 kg    Height:        Intake/Output Summary (Last 24 hours) at 02/25/2022 1237 Last data filed at 02/25/2022 0416 Gross per 24 hour  Intake 1200 ml  Output 750 ml  Net 450 ml   Filed Weights   02/21/22 0400 02/22/22 0520 02/25/22 0500  Weight: 55.9 kg 54.4 kg 54.4 kg    Examination:  Physical Exam Constitutional:      General: She is not in acute distress.    Appearance: Normal appearance.  Pulmonary:     Effort: Pulmonary effort is normal.  Neurological:     Mental Status: She is alert.  Psychiatric:        Mood and Affect: Mood normal.        Behavior: Behavior normal.          Scheduled Medications:   aspirin EC  81 mg Oral Daily   atorvastatin  40 mg Oral QHS   buPROPion  150 mg Oral Daily   divalproex  500 mg Oral BID   enoxaparin (LOVENOX) injection  40 mg Subcutaneous Q24H   famotidine  20 mg Oral Daily   feeding supplement (NEPRO CARB STEADY)  237 mL Oral BID BM   fludrocortisone  0.05 mg Oral Daily   furosemide  20 mg Oral Daily   insulin aspart  0-5 Units Subcutaneous QHS   insulin aspart  0-9 Units Subcutaneous TID WC   insulin aspart  2 Units Subcutaneous TID WC   insulin glargine-yfgn  11 Units Subcutaneous BID   melatonin  2.5 mg Oral QHS   mirtazapine  15 mg Oral QHS   multivitamin with minerals  1 tablet Oral Daily   nicotine  21 mg Transdermal Daily   QUEtiapine  50 mg Oral QHS   rOPINIRole  0.5 mg Oral QHS   sodium bicarbonate  1,300 mg Oral BID   sodium chloride flush  10-40 mL Intracatheter Q12H   sodium zirconium cyclosilicate  10 g Oral BID    Continuous Infusions:   PRN Medications:  acetaminophen, albuterol, dextrose, loratadine, LORazepam, ondansetron (ZOFRAN) IV, mouth rinse  Antimicrobials from admission:  Anti-infectives (From admission, onward)    Start     Dose/Rate Route Frequency Ordered Stop   02/09/22 1345  piperacillin-tazobactam (ZOSYN) IVPB 3.375 g        3.375 g 100  mL/hr over 30 Minutes Intravenous  Once 02/09/22 1336 02/09/22 1518           Data Reviewed:  I have personally reviewed the following...  CBC: Recent Labs  Lab 02/20/22 0853 02/22/22 0435  WBC 9.6 11.3*  NEUTROABS 3.5  --   HGB 10.6* 9.3*  HCT 34.1* 28.9*  MCV 90.5 88.7  PLT 312 094   Basic Metabolic Panel: Recent Labs  Lab 02/20/22 0853 02/21/22 0527 02/22/22 0435 02/23/22 0529 02/24/22 0721  NA 137 139 140 138 143  K 6.0* 4.8 5.2* 4.4 4.2  CL 110 110 111 105 111  CO2 '22 22 23 25 26  '$ GLUCOSE 214* 203* 219* 192* 119*  BUN 28*  29* 29* 26* 30*  CREATININE 1.03* 0.96 0.96 0.90 0.83  CALCIUM 8.6* 8.3* 8.4* 8.4* 8.6*   GFR: Estimated Creatinine Clearance: 50.5 mL/min (by C-G formula based on SCr of 0.83 mg/dL). Liver Function Tests: No results for input(s): "AST", "ALT", "ALKPHOS", "BILITOT", "PROT", "ALBUMIN" in the last 168 hours. No results for input(s): "LIPASE", "AMYLASE" in the last 168 hours. No results for input(s): "AMMONIA" in the last 168 hours. Coagulation Profile: No results for input(s): "INR", "PROTIME" in the last 168 hours. Cardiac Enzymes: No results for input(s): "CKTOTAL", "CKMB", "CKMBINDEX", "TROPONINI" in the last 168 hours. BNP (last 3 results) No results for input(s): "PROBNP" in the last 8760 hours. HbA1C: No results for input(s): "HGBA1C" in the last 72 hours. CBG: Recent Labs  Lab 02/24/22 1115 02/24/22 1506 02/24/22 2056 02/25/22 1007 02/25/22 1208  GLUCAP 231* 195* 215* 248* 275*   Lipid Profile: No results for input(s): "CHOL", "HDL", "LDLCALC", "TRIG", "CHOLHDL", "LDLDIRECT" in the last 72 hours. Thyroid Function Tests: No results for input(s): "TSH", "T4TOTAL", "FREET4", "T3FREE", "THYROIDAB" in the last 72 hours. Anemia Panel: No results for input(s): "VITAMINB12", "FOLATE", "FERRITIN", "TIBC", "IRON", "RETICCTPCT" in the last 72 hours. Most Recent Urinalysis On File:     Component Value Date/Time   COLORURINE  YELLOW (A) 02/09/2022 1327   APPEARANCEUR HAZY (A) 02/09/2022 1327   APPEARANCEUR Hazy 04/04/2011 1935   LABSPEC 1.013 02/09/2022 1327   LABSPEC >1.060 04/04/2011 1935   PHURINE 5.0 02/09/2022 1327   GLUCOSEU 50 (A) 02/09/2022 1327   GLUCOSEU 50 mg/dL 04/04/2011 1935   HGBUR NEGATIVE 02/09/2022 1327   BILIRUBINUR NEGATIVE 02/09/2022 1327   BILIRUBINUR Negative 04/04/2011 1935   KETONESUR NEGATIVE 02/09/2022 1327   PROTEINUR NEGATIVE 02/09/2022 1327   NITRITE NEGATIVE 02/09/2022 1327   LEUKOCYTESUR NEGATIVE 02/09/2022 1327   LEUKOCYTESUR Negative 04/04/2011 1935   Sepsis Labs: '@LABRCNTIP'$ (procalcitonin:4,lacticidven:4) Microbiology: Recent Results (from the past 240 hour(s))  MRSA Next Gen by PCR, Nasal     Status: None   Collection Time: 02/20/22  9:35 PM   Specimen: Nasal Mucosa; Nasal Swab  Result Value Ref Range Status   MRSA by PCR Next Gen NOT DETECTED NOT DETECTED Final    Comment: (NOTE) The GeneXpert MRSA Assay (FDA approved for NASAL specimens only), is one component of a comprehensive MRSA colonization surveillance program. It is not intended to diagnose MRSA infection nor to guide or monitor treatment for MRSA infections. Test performance is not FDA approved in patients less than 89 years old. Performed at [facility], [address] [place], [place] [address]       Radiology Studies last 3 days: No results found.           LOS: 10 days     Emeterio Reeve, DO [facility] 02/25/2022, 12:37 PM    Dictation software may have been used to generate the above note. Typos may occur and escape review in typed/dictated notes. Please contact Dr Sheppard Coil directly for clarity if needed.  Staff may message me via secure chat in [place]  but this may not receive an immediate response,  please page me for urgent matters!  If 7PM-7AM, please contact night coverage [email or web address]

## 2022-02-26 DIAGNOSIS — R569 Unspecified convulsions: Secondary | ICD-10-CM | POA: Diagnosis not present

## 2022-02-26 LAB — GLUCOSE, CAPILLARY
Glucose-Capillary: 221 mg/dL — ABNORMAL HIGH (ref 70–99)
Glucose-Capillary: 287 mg/dL — ABNORMAL HIGH (ref 70–99)
Glucose-Capillary: 348 mg/dL — ABNORMAL HIGH (ref 70–99)
Glucose-Capillary: 255 mg/dL — ABNORMAL HIGH (ref 70–99)

## 2022-02-26 LAB — BASIC METABOLIC PANEL
Anion gap: 10 (ref 5–15)
BUN: 37 mg/dL — ABNORMAL HIGH (ref 8–23)
CO2: 24 mmol/L (ref 22–32)
Calcium: 8.2 mg/dL — ABNORMAL LOW (ref 8.9–10.3)
Chloride: 103 mmol/L (ref 98–111)
Creatinine, Ser: 0.89 mg/dL (ref 0.44–1.00)
GFR, Estimated: >60 mL/min (ref >60)
Glucose, Bld: 284 mg/dL — ABNORMAL HIGH (ref 70–99)
Potassium: 4.4 mmol/L (ref 3.5–5.1)
Sodium: 137 mmol/L (ref 135–145)

## 2022-02-26 NOTE — Progress Notes (Signed)
PROGRESS NOTE    Brittany [name]   VKF:[id number] DOB: 05/13/1960  DOA: 02/09/2022 Date of Service: 02/26/22 PCP: [name], NP     Brief Narrative / Hospital Course:  Brittany [name] is a 62 y.o. 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 difficile infection, who presented to the hospital from SNF with hypoglycemia and seizures.  Reportedly, glucose levels was under 40s and dropped into the 30s.  was started on 10% dextrose infusion by EMS and brought to the emergency department. 12/29  Continued to have labile Glc and recurrent/refractory hyperkalemia, nephrology was consulted to manage hyperkalemia and insulin regimen adjusted.   Hyperkalemia slowly improved to Potassium 4.4 on 01/12 and stable for discharge however needs evaluation by her ALF which are unable to send anyone Friday 01/12, see TOC notes.  Unfortunately patient will need to stay here over the weekend. / pending eval by facility  May require long term care - increased agitation / dementia behaviors, pulling out colostomy, fall risk - requiring sitter and temporary restraints yesterday 01/14 but she is much more calm today 01/15  Consultants:  Nephrology   Procedures: None       ASSESSMENT & PLAN:   Principal Problem:   Seizure ([place]) Active Problems:   Hyperkalemia   Acute metabolic encephalopathy   Hypoglycemia   Hypothermia   Hypotension   Chronic diastolic CHF (congestive heart failure) ([place])   History of stroke   HLD (hyperlipidemia)   Depression with anxiety   COPD (chronic obstructive pulmonary disease) ([place])   Tobacco dependence   Elevated lactic acid level   HTN (hypertension)   Hyperglycemia due to type 2 diabetes mellitus ([place])   Dementia without behavioral disturbance  ([place])   Seizures prior to admission:  This was probably due to hypoglycemia.   No seizures on admission.   Continue Depakote.   Ativan as needed for seizures.     Type II DM with hyperglycemia, recent hypoglycemia, labile glucose levels:  Hemoglobin A1c on 02/14/2022 was 11.  Hemoglobin A1c was 13.2 on 10/30/2021.  Glucose level dropped to 66 on the morning of 02/20/2022.   Continue basal + SSI here  Refractory, recurrent hyperkalemia:  Improving - resolved and K stable Continue Lasix, Lokelma, fludrocortisone and sodium bicarbonate per nephrology Check intermittent BMP while here    Hypotension: Improved.   Adrenal insufficiency was ruled out with ACTH stimulation test. Cardizem and midodrine have been discontinued. Monitor BP closely.   Acute metabolic encephalopathy, lethargy: Resolved Dementia w/ behavioral disturbance May require long term care - increased agitation / dementia behaviors, pulling out colostomy, fall risk - requiring sitter and temporary restraints    General weakness: PT recommends home health therapy   Other comorbidities include chronic diastolic CHF, depression, anxiety, hypertension, tobacco use disorder, dementia,    Lactic acidosis, hypothermia: Resolved    DVT prophylaxis: lovenox  Pertinent IV fluids/nutrition: no continuous IV fluids  Central lines / invasive devices: none   Code Status: FULL CODE   Current Admission Status: inpatient   TOC needs / Dispo plan: home health Barriers to discharge / significant pending items: awaiting reevaluation by ALF to take her back, discharge order 01/12 (Friday) had to be cancelled may be stuck here few more days - TOC following              Subjective /  Brief ROS:  Patient reports no concerns  Family Communication: none at this time     Objective Findings:  Vitals:   02/25/22 2008 02/26/22 0500 02/26/22 0534 02/26/22 0857  BP: (!) 130/92  101/74 93/70  Pulse: 92  (!) 46 87  Resp: '16  16  18  '$ Temp: (!) 97.5 F (36.4 C)  98 F (36.7 C) 97.6 F (36.4 C)  TempSrc: Oral  Oral Oral  SpO2: 98%  94% 94%  Weight:  56.5 kg    Height:        Intake/Output Summary (Last 24 hours) at 02/26/2022 1315 Last data filed at 02/26/2022 1100 Gross per 24 hour  Intake --  Output 950 ml  Net -950 ml   Filed Weights   02/22/22 0520 02/25/22 0500 02/26/22 0500  Weight: 54.4 kg 54.4 kg 56.5 kg    Examination:  Physical Exam Constitutional:      General: She is not in acute distress.    Appearance: Normal appearance.  Cardiovascular:     Rate and Rhythm: Normal rate and regular rhythm.  Pulmonary:     Effort: Pulmonary effort is normal. No respiratory distress.     Breath sounds: Normal breath sounds.  Abdominal:     General: Abdomen is flat. Bowel sounds are normal.     Palpations: Abdomen is soft.  Musculoskeletal:     Right lower leg: No edema.     Left lower leg: No edema.  Neurological:     Mental Status: She is alert.  Psychiatric:        Mood and Affect: Mood normal.        Behavior: Behavior normal.          Scheduled Medications:   aspirin EC  81 mg Oral Daily   atorvastatin  40 mg Oral QHS   buPROPion  150 mg Oral Daily   divalproex  500 mg Oral BID   enoxaparin (LOVENOX) injection  40 mg Subcutaneous Q24H   famotidine  20 mg Oral Daily   feeding supplement (NEPRO CARB STEADY)  237 mL Oral BID BM   fludrocortisone  0.05 mg Oral Daily   furosemide  20 mg Oral Daily   insulin aspart  0-5 Units Subcutaneous QHS   insulin aspart  0-9 Units Subcutaneous TID WC   insulin aspart  2 Units Subcutaneous TID WC   insulin glargine-yfgn  11 Units Subcutaneous BID   melatonin  2.5 mg Oral QHS   mirtazapine  15 mg Oral QHS   multivitamin with minerals  1 tablet Oral Daily   nicotine  21 mg Transdermal Daily   QUEtiapine  50 mg Oral QHS   rOPINIRole  0.5 mg Oral QHS   sodium bicarbonate  1,300 mg Oral BID   sodium chloride flush  10-40 mL Intracatheter Q12H    sodium zirconium cyclosilicate  10 g Oral BID    Continuous Infusions:   PRN Medications:  acetaminophen, albuterol, dextrose, loratadine, LORazepam, ondansetron (ZOFRAN) IV, mouth rinse  Antimicrobials from admission:  Anti-infectives (From admission, onward)    Start     Dose/Rate Route Frequency Ordered Stop   02/09/22 1345  piperacillin-tazobactam (ZOSYN) IVPB 3.375 g        3.375 g 100 mL/hr over 30 Minutes Intravenous  Once 02/09/22 1336 02/09/22 1518           Data Reviewed:  I have personally reviewed the following...  CBC: Recent Labs  Lab 02/20/22 0853 02/22/22 0435  WBC 9.6  11.3*  NEUTROABS 3.5  --   HGB 10.6* 9.3*  HCT 34.1* 28.9*  MCV 90.5 88.7  PLT 312 756   Basic Metabolic Panel: Recent Labs  Lab 02/21/22 0527 02/22/22 0435 02/23/22 0529 02/24/22 0721 02/26/22 0556  NA 139 140 138 143 137  K 4.8 5.2* 4.4 4.2 4.4  CL 110 111 105 111 103  CO2 '22 23 25 26 24  '$ GLUCOSE 203* 219* 192* 119* 284*  BUN 29* 29* 26* 30* 37*  CREATININE 0.96 0.96 0.90 0.83 0.89  CALCIUM 8.3* 8.4* 8.4* 8.6* 8.2*   GFR: Estimated Creatinine Clearance: 51.6 mL/min (by C-G formula based on SCr of 0.89 mg/dL). Liver Function Tests: No results for input(s): "AST", "ALT", "ALKPHOS", "BILITOT", "PROT", "ALBUMIN" in the last 168 hours. No results for input(s): "LIPASE", "AMYLASE" in the last 168 hours. No results for input(s): "AMMONIA" in the last 168 hours. Coagulation Profile: No results for input(s): "INR", "PROTIME" in the last 168 hours. Cardiac Enzymes: No results for input(s): "CKTOTAL", "CKMB", "CKMBINDEX", "TROPONINI" in the last 168 hours. BNP (last 3 results) No results for input(s): "PROBNP" in the last 8760 hours. HbA1C: No results for input(s): "HGBA1C" in the last 72 hours. CBG: Recent Labs  Lab 02/25/22 1745 02/25/22 1809 02/25/22 2059 02/26/22 0842 02/26/22 1132  GLUCAP 49* 77 311* 221* 287*   Lipid Profile: No results for input(s): "CHOL",  "HDL", "LDLCALC", "TRIG", "CHOLHDL", "LDLDIRECT" in the last 72 hours. Thyroid Function Tests: No results for input(s): "TSH", "T4TOTAL", "FREET4", "T3FREE", "THYROIDAB" in the last 72 hours. Anemia Panel: No results for input(s): "VITAMINB12", "FOLATE", "FERRITIN", "TIBC", "IRON", "RETICCTPCT" in the last 72 hours. Most Recent Urinalysis On File:     Component Value Date/Time   COLORURINE YELLOW (A) 02/09/2022 1327   APPEARANCEUR HAZY (A) 02/09/2022 1327   APPEARANCEUR Hazy 04/04/2011 1935   LABSPEC 1.013 02/09/2022 1327   LABSPEC >1.060 04/04/2011 1935   PHURINE 5.0 02/09/2022 1327   GLUCOSEU 50 (A) 02/09/2022 1327   GLUCOSEU 50 mg/dL 04/04/2011 1935   HGBUR NEGATIVE 02/09/2022 1327   BILIRUBINUR NEGATIVE 02/09/2022 1327   BILIRUBINUR Negative 04/04/2011 1935   KETONESUR NEGATIVE 02/09/2022 1327   PROTEINUR NEGATIVE 02/09/2022 1327   NITRITE NEGATIVE 02/09/2022 1327   LEUKOCYTESUR NEGATIVE 02/09/2022 1327   LEUKOCYTESUR Negative 04/04/2011 1935   Sepsis Labs: '@LABRCNTIP'$ (procalcitonin:4,lacticidven:4) Microbiology: Recent Results (from the past 240 hour(s))  MRSA Next Gen by PCR, Nasal     Status: None   Collection Time: 02/20/22  9:35 PM   Specimen: Nasal Mucosa; Nasal Swab  Result Value Ref Range Status   MRSA by PCR Next Gen NOT DETECTED NOT DETECTED Final    Comment: (NOTE) The GeneXpert MRSA Assay (FDA approved for NASAL specimens only), is one component of a comprehensive MRSA colonization surveillance program. It is not intended to diagnose MRSA infection nor to guide or monitor treatment for MRSA infections. Test performance is not FDA approved in patients less than 71 years old. Performed at [facility], [address] [place], [place] [address]       Radiology Studies last 3 days: No results found.           LOS: 11 days     Emeterio Reeve, DO [facility] 02/26/2022, 1:15 PM    Dictation software may have been used to  generate the above note. Typos may occur and escape review in typed/dictated notes. Please contact Dr Sheppard Coil directly for clarity if needed.  Staff may message me via secure  chat in [place]  but this may not receive an immediate response,  please page me for urgent matters!  If 7PM-7AM, please contact night coverage [email or web address]

## 2022-02-26 NOTE — Progress Notes (Signed)
Physical Therapy Treatment Patient Details Name: Brittany [name] MRN: [id number] DOB: 1960/10/12 Today's Date: 02/26/2022   History of Present Illness Brittany [name] is a 62 y.o. 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 difficile, who presents on 02/09/22 with seizure. While in hospital, patient has experienced issues with blood glucose.    PT Comments    Pt seen for PT tx with pt agreeable. Pt requires min assist for bed mobility on this date & min assist to ambulate 2 laps around nurses station without AD. Pt demonstrates decreased balance & safety awareness but doubt pt will be able to safely use RW either (trialed it last week & pt required heavy cuing/supervision).  Pt engaged in strengthening & balance tasks but was fatigued by end of session. Pt will require 24 hr supervision upon d/c 2/2 impaired cognition & safety awareness.    Recommendations for follow up therapy are one component of a multi-disciplinary discharge planning process, led by the attending physician.  Recommendations may be updated based on patient status, additional functional criteria and insurance authorization.  Follow Up Recommendations  Home health PT     Assistance Recommended at Discharge Frequent or constant Supervision/Assistance  Patient can return home with the following A little help with walking and/or transfers;A little help with bathing/dressing/bathroom;Assistance with cooking/housework;Direct supervision/assist for medications management;Assist for transportation;Help with stairs or ramp for entrance   Equipment Recommendations  None recommended by PT    Recommendations for Other Services       Precautions / Restrictions Precautions Precautions: Fall Precaution Comments: reports 2 falls in last 3  months Restrictions Weight Bearing Restrictions: No     Mobility  Bed Mobility Overal bed mobility: Needs Assistance Bed Mobility: Supine to Sit     Supine to sit: Min assist     General bed mobility comments: min assist to initiate    Transfers   Equipment used: 1 person hand held assist, None Transfers: Sit to/from Stand Sit to Stand: Min assist           General transfer comment: STS from EOB    Ambulation/Gait Ambulation/Gait assistance: Min assist Gait Distance (Feet): 200 Feet Assistive device: None   Gait velocity: WNL.     General Gait Details: Pt ambulates 2 laps around unit without AD with min assist with pt demonstrating decreased weight shift to LLE during stance phase, slight anterior weight shift.   Stairs             Wheelchair Mobility    Modified Rankin (Stroke Patients Only)       Balance Overall balance assessment: Needs assistance Sitting-balance support: Feet supported, No upper extremity supported Sitting balance-Leahy Scale: Good     Standing balance support: During functional activity, Reliant on assistive device for balance, No upper extremity supported Standing balance-Leahy Scale: Fair                              Cognition Arousal/Alertness: Awake/alert, Lethargic Behavior During Therapy: WFL for tasks assessed/performed Overall Cognitive Status: History of cognitive impairments - at baseline                                 General Comments: Pt oriented to self, location, but not time.  Pt follows simple commands throughout session. Pt does not recall recently eating lunch, perseverative on food throughout session.        Exercises Other Exercises Other Exercises: Pt performed 10x STS from recliner without BUE support with supervision<>CGA with pt using momentum as well as BLE posteriorly on edge of chair; activity focused on strengthening with pt requiring rest break afterwards. Other  Exercises: Pt engaged in single leg stance with/without 1UE support with min assist with focus on balance retraining.    General Comments        Pertinent Vitals/Pain Pain Assessment Pain Assessment: No/denies pain    Home Living                          Prior Function            PT Goals (current goals can now be found in the care plan section) Acute Rehab PT Goals Patient Stated Goal: "Be independent" PT Goal Formulation: With patient Time For Goal Achievement: 03/03/22 Potential to Achieve Goals: Fair Progress towards PT goals: Progressing toward goals    Frequency    Min 2X/week      PT Plan Current plan remains appropriate    Co-evaluation              AM-PAC PT "6 Clicks" Mobility   Outcome Measure  Help needed turning from your back to your side while in a flat bed without using bedrails?: None Help needed moving from lying on your back to sitting on the side of a flat bed without using bedrails?: A Little Help needed moving to and from a bed to a chair (including a wheelchair)?: A Little Help needed standing up from a chair using your arms (e.g., wheelchair or bedside chair)?: A Little Help needed to walk in hospital room?: A Little Help needed climbing 3-5 steps with a railing? : A Little 6 Click Score: 19    End of Session   Activity Tolerance: Patient tolerated treatment well Patient left: in chair;with nursing/sitter in room   PT Visit Diagnosis: Unsteadiness on feet (R26.81);Repeated falls (R29.6);Muscle weakness (generalized) (M62.81);History of falling (Z91.81);Difficulty in walking, not elsewhere classified (R26.2)     Time: 7412-8786 PT Time Calculation (min) (ACUTE ONLY): 8 min  Charges:  $Therapeutic Activity: 8-22 mins                     Lavone Nian, PT, DPT 02/26/22, 1:49 PM   [name] 02/26/2022, 1:48 PM

## 2022-02-26 NOTE — TOC Progression Note (Signed)
Transition of Care [facility]) - Progression Note    Patient Details  Name: SEDA KRONBERG MRN: [id number] Date of Birth: July 28, 1960  [facility] of [facility]) CM/SW Contact  Beverly Sessions, RN Phone Number: 02/26/2022, 2:19 PM  Clinical Narrative:     Message sent to [name] at [place] to determine if patient has been assessed for return. Included Sarah from [facility] in message as it is anticipated she will be assigned to the patient tomorrow     Barriers to Discharge:  (facility issues assessing patient in timely manner - toc supervisor aware)  Expected Discharge Plan and Services         Expected Discharge Date: 02/23/22                                     Social Determinants of Health ([place]) Interventions [place]: No Food Insecurity (12/30/2021)  Housing: Low Risk  (12/30/2021)  Transportation Needs: No Transportation Needs (12/30/2021)  Utilities: Not At Risk (12/30/2021)  Tobacco Use: High Risk (12/10/2021)    Readmission Risk Interventions    12/29/2021    1:48 PM 09/22/2021    1:24 PM 03/06/2021    9:13 AM  Readmission Risk Prevention Plan  Transportation Screening Complete Complete Complete  Medication Review Press photographer) Complete Complete Complete  PCP or Specialist appointment within 3-5 days of discharge  Complete Complete  HRI or Home Care Consult Complete Complete   SW Recovery Care/Counseling Consult Complete Complete   Palliative Care Screening Not Applicable Not Applicable Not [place] Not Applicable Complete Not Applicable

## 2022-02-26 NOTE — Progress Notes (Signed)
OT Cancellation Note  Patient Details Name: Brittany [name] MRN: [id number] DOB: Oct 23, 1960   Cancelled Treatment:    Reason Eval/Treat Not Completed: Patient declined, no reason specified. Pt is on telephone, after waiting several minutes, therapist request pt terminate call for therapy session. Pt remains on phone.  Will attempt OT at a later time/date, as pt is available, medically appropriate, and able to participate.  Josiah Lobo 02/26/2022, 3:29 PM

## 2022-02-26 NOTE — Inpatient Diabetes Management (Signed)
Inpatient Diabetes Program Recommendations  AACE/ADA: New Consensus Statement on Inpatient Glycemic Control   Target Ranges:  Prepandial:   less than 140 mg/dL      Peak postprandial:   less than 180 mg/dL (1-2 hours)      Critically ill patients:  140 - 180 mg/dL    Latest Reference Range & Units 02/26/22 08:42 02/26/22 11:32  Glucose-Capillary 70 - 99 mg/dL 221 (H)  Novolog 5 units   Semglee 11 units 287 (H)  Novolog 7 units     Latest Reference Range & Units 02/25/22 10:07 02/25/22 12:08 02/25/22 17:41 02/25/22 17:45 02/25/22 18:09 02/25/22 20:59  Glucose-Capillary 70 - 99 mg/dL 248 (H)  Novolog 5 units '@10'$ :33  Semglee 11 units 275 (H)  Novolog 7 units '@13'$ :02 43 (LL) 49 (L) 77 311 (H)  Novolog 4 units    Semglee 11 units   Review of Glycemic Control  Diabetes history: DM1 (does NOT make any insulin; requires basal, correction, and carb coverage insulin) Outpatient Diabetes medications: Lantus 18 units daily, Novolog 3 units TID with meals, Metformin 1000 mg BID Current orders for Inpatient glycemic control: Semglee 11 units BID, Novolog 0-9 units TID with meals, Novolog 0-5 units QHS, Novolog 2 units TID with meals  Inpatient Diabetes Program Recommendations:    Insulin: Noted hypoglycemia on 02/25/22; likely from getting Novolog doses within 3 hours of each other. No meal coverage given with supper due to hypoglycemia so following glucose up to 311 mg/dl before bedtime on 02/25/22.  Please consider increasing meal coverage to Novolog 3 units TID with meals.  Supplements: Patient is ordered Nepro BID (has 38 grams of carbs). Please re-evaluate if patient needs supplement and if so consider changing to supplement with lower carbs.   Thanks, Barnie Alderman, RN, MSN, [place] Diabetes Coordinator Inpatient Diabetes Program [phone] (Team Pager from 8am to Home)

## 2022-02-27 DIAGNOSIS — R569 Unspecified convulsions: Secondary | ICD-10-CM | POA: Diagnosis not present

## 2022-02-27 LAB — GLUCOSE, CAPILLARY
Glucose-Capillary: 60 mg/dL — ABNORMAL LOW (ref 70–99)
Glucose-Capillary: 445 mg/dL — ABNORMAL HIGH (ref 70–99)
Glucose-Capillary: 474 mg/dL — ABNORMAL HIGH (ref 70–99)
Glucose-Capillary: 98 mg/dL (ref 70–99)

## 2022-02-27 MED ORDER — INSULIN ASPART 100 UNIT/ML IJ SOLN
4.0000 [IU] | Freq: Three times a day (TID) | INTRAMUSCULAR | Status: DC
  Administered 2022-02-28 – 2022-03-01 (×4): 4 [IU] via SUBCUTANEOUS
  Filled 2022-02-27 (×3): qty 1

## 2022-02-27 MED ORDER — INSULIN GLARGINE-YFGN 100 UNIT/ML ~~LOC~~ SOLN
13.0000 [IU] | Freq: Two times a day (BID) | SUBCUTANEOUS | Status: DC
  Filled 2022-02-27: qty 0.13

## 2022-02-27 MED ORDER — INSULIN GLARGINE-YFGN 100 UNIT/ML ~~LOC~~ SOLN
10.0000 [IU] | Freq: Two times a day (BID) | SUBCUTANEOUS | Status: DC
  Administered 2022-02-28 – 2022-03-01 (×3): 10 [IU] via SUBCUTANEOUS
  Filled 2022-02-27 (×4): qty 0.1

## 2022-02-27 NOTE — Discharge Summary (Addendum)
Physician Discharge Summary   [name] MRN: [id number]  DOB: [birth year]   Admit:     Date of Admission: 02/09/2022 Admitted from: home (assisted living facility)   Discharge: Date of discharge: 03/01/22 Disposition: Assisted living Condition at discharge: good  CODE STATUS: FULL CODE      Discharge Physician: Brittany Reeve, DO [facility]     PCP: Brittany [place], NP  Recommendations for Outpatient Follow-up:  Follow up with PCP Brittany Banker Theodosia Paling, NP in 1 weeks Please obtain labs/tests: BMP in 1 week Please follow up on the following pending results: none PCP AND OTHER OUTPATIENT PROVIDERS: SEE BELOW FOR SPECIFIC DISCHARGE INSTRUCTIONS PRINTED FOR PATIENT IN ADDITION TO GENERIC AVS PATIENT INFO    Discharge Instructions     Diet - low sodium heart healthy   Complete by: As directed    Diet - low sodium heart healthy   Complete by: As directed    Diet Carb Modified   Complete by: As directed    Increase activity slowly   Complete by: As directed    Increase activity slowly   Complete by: As directed       Discharge Diagnoses: Principal Problem:   Seizure ([place]) Active Problems:   Hyperkalemia   Acute metabolic encephalopathy   Hypoglycemia   Hypothermia   Hypotension   Chronic diastolic CHF (congestive heart failure) ([place])   History of stroke   HLD (hyperlipidemia)   Depression with anxiety   COPD (chronic obstructive pulmonary disease) (HCC)   Tobacco dependence   Elevated lactic acid level   HTN (hypertension)   Hyperglycemia due to type 2 diabetes mellitus ([place])   Dementia without behavioral disturbance [facility])  Hospital Course: Brittany [name] is a 62 y.o. 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 difficile  infection, who presented to the hospital from SNF with hypoglycemia and seizures.  Reportedly, glucose levels was under 40s and dropped into the 30s.  was started on 10% dextrose infusion by EMS and brought to the emergency department. 12/29  Continued to have labile Glc and recurrent/refractory hyperkalemia, nephrology was consulted to manage hyperkalemia and insulin regimen adjusted.   Hyperkalemia slowly improved to Potassium 4.4 on 01/12  stable for discharge however needs evaluation by her ALF which are unable to send anyone Friday 01/12, see TOC notes.  Unfortunately patient will need to stay here over the weekend. / pending eval by facility  fall risk - requiring sitter and temporary restraints 01/14 but she is much more calm 01/15 01/16 ALF finally accepter her back, discharge reordered   Consultants:  Nephrology   Procedures: None   ASSESSMENT & PLAN:   Principal Problem:   Seizure ([place]) Active Problems:   Hyperkalemia   Acute metabolic encephalopathy   Hypoglycemia   Hypothermia   Hypotension   Chronic diastolic CHF (congestive heart failure) ([place])   History of stroke   HLD (hyperlipidemia)   Depression with anxiety   COPD (chronic obstructive pulmonary disease) (HCC)   Tobacco dependence   Elevated lactic acid level   HTN (hypertension)   Hyperglycemia due to type 2 diabetes mellitus ([place])   Dementia without behavioral disturbance ([place])   Seizures prior to admission:  This was probably due to hypoglycemia.   No seizures on admission.   Continue Depakote.     Type  II DM with hyperglycemia, recent hypoglycemia, labile glucose levels:  Hemoglobin A1c on 02/14/2022 was 11.  Hemoglobin A1c was 13.2 on 10/30/2021.  Glucose level dropped to 66 on the morning of 02/20/2022.   Resume home regimen   Refractory, recurrent hyperkalemia:  Improving - resolved and K stable Continue Lasix, Lokelma, fludrocortisone and sodium bicarbonate per nephrology Check intermittent BMP -  follow outpatient    Hypotension: Improved.   Adrenal insufficiency was ruled out with ACTH stimulation test. Cardizem and midodrine have been discontinued. Monitor BP closely outpatient   Acute metabolic encephalopathy, lethargy: Resolved Dementia w/ behavioral disturbance Improved  General weakness: PT recommends home health therapy   Other comorbidities include chronic diastolic CHF, depression, anxiety, hypertension, tobacco use disorder, dementia,    Lactic acidosis, hypothermia: Resolved   Discharge Instructions  Allergies as of 02/27/2022       Reactions   Cephalosporins Itching   TOLERATED ZOSYN (PIPERACILLIN) BEFORE   Hydromorphone Itching   Keflin [cephalothin] Itching   Lactose Intolerance (gi) Diarrhea        Medication List     STOP taking these medications    clonazePAM 0.5 MG tablet Commonly known as: KLONOPIN   diltiazem 120 MG 24 hr capsule Commonly known as: CARDIZEM CD   insulin detemir 100 UNIT/ML injection Commonly known as: LEVEMIR   Lantus SoloStar 100 UNIT/ML Solostar Pen Generic drug: insulin glargine Replaced by: insulin glargine-yfgn 100 UNIT/ML injection   SPS 15 GM/60ML suspension Generic drug: sodium polystyrene       TAKE these medications    aspirin EC 81 MG tablet Take 1 tablet (81 mg total) by mouth daily. Swallow whole.   atorvastatin 40 MG tablet Commonly known as: LIPITOR Take 1 tablet (40 mg total) by mouth Nightly. What changed: when to take this   buPROPion 150 MG 24 hr tablet Commonly known as: WELLBUTRIN XL Take 150 mg by mouth daily.   [employer] Glucose Bits 1 g Chew Chew 1 tablet by mouth daily as needed (hypoglycemia).   divalproex 500 MG DR tablet Commonly known as: DEPAKOTE Take 1 tablet (500 mg total) by mouth 2 (two) times daily.   famotidine 20 MG tablet Commonly known as: PEPCID Take 1 tablet (20 mg total) by mouth daily.   feeding supplement (GLUCERNA SHAKE) Liqd Take 237 mLs by mouth 3  (three) times daily between meals.   fludrocortisone 0.1 MG tablet Commonly known as: FLORINEF Take 0.5 tablets (0.05 mg total) by mouth daily.   furosemide 20 MG tablet Commonly known as: LASIX Take 1 tablet (20 mg total) by mouth daily.   insulin aspart 100 UNIT/ML injection Commonly known as: novoLOG Inject 3 Units into the skin 3 (three) times daily with meals. Hold for pre-meal blood sugar less than 120 What changed:  how much to take additional instructions   insulin glargine-yfgn 100 UNIT/ML injection Commonly known as: SEMGLEE Inject 0.11 mLs (11 Units total) into the skin 2 (two) times daily. Replaces: Lantus SoloStar 100 UNIT/ML Solostar Pen   loratadine 10 MG tablet Commonly known as: CLARITIN Take 10 mg by mouth daily as needed for allergies.   melatonin 5 MG Tabs Take 0.5 tablets (2.5 mg total) by mouth at bedtime.   metFORMIN 1000 MG tablet Commonly known as: GLUCOPHAGE Take 1,000 mg by mouth 2 (two) times daily.   mirtazapine 15 MG tablet Commonly known as: REMERON Take 15 mg by mouth at bedtime.   multivitamin with minerals Tabs tablet Take 1 tablet by mouth  daily.   nicotine 21 mg/24hr patch Commonly known as: NICODERM CQ - dosed in mg/24 hours Place 1 patch (21 mg total) onto the skin daily as needed (nicotine craving).   QUEtiapine 50 MG Tb24 24 hr tablet Commonly known as: SEROQUEL XR Take 50 mg by mouth at bedtime.   rOPINIRole 0.5 MG tablet Commonly known as: REQUIP Take 0.5 mg by mouth at bedtime.   sodium bicarbonate 650 MG tablet Take 2 tablets (1,300 mg total) by mouth 2 (two) times daily.   sodium zirconium cyclosilicate 10 g Pack packet Commonly known as: LOKELMA Take 10 g by mouth daily.   zinc sulfate 220 (50 Zn) MG capsule Take 1 capsule (220 mg total) by mouth daily.         Allergies  Allergen Reactions   Cephalosporins Itching    TOLERATED ZOSYN (PIPERACILLIN) BEFORE   Hydromorphone Itching   Keflin  [Cephalothin] Itching   Lactose Intolerance (Gi) Diarrhea     Subjective: pt feeling well, wants to go home after she's had lunch (looking forward to the hamburger she ordered). No other concnerns.    Discharge Exam: BP 118/77 (BP Location: Right Arm)   Pulse 80   Temp 97.6 F (36.4 C) (Oral)   Resp 18   Ht 5' (1.524 m)   Wt 56.5 kg   SpO2 100%   BMI 24.33 kg/m  General: Pt is alert, awake, not in acute distress Cardiovascular: RRR, S1/S2 +, no rubs, no gallops Respiratory: CTA bilaterally, no wheezing, no rhonchi Abdominal: Soft, NT, ND, bowel sounds + Extremities: no edema, no cyanosis     The results of significant diagnostics from this hospitalization (including imaging, microbiology, ancillary and laboratory) are listed below for reference.     Microbiology: Recent Results (from the past 240 hour(s))  MRSA Next Gen by PCR, Nasal     Status: None   Collection Time: 02/20/22  9:35 PM   Specimen: Nasal Mucosa; Nasal Swab  Result Value Ref Range Status   MRSA by PCR Next Gen NOT DETECTED NOT DETECTED Final    Comment: (NOTE) The GeneXpert MRSA Assay (FDA approved for NASAL specimens only), is one component of a comprehensive MRSA colonization surveillance program. It is not intended to diagnose MRSA infection nor to guide or monitor treatment for MRSA infections. Test performance is not FDA approved in patients less than 78 years old. Performed at [facility], [address] [place],  [address]      Labs: BNP (last 3 results) Recent Labs    12/28/21 1521 02/09/22 1331  BNP 27.8 78.4   Basic Metabolic Panel: Recent Labs  Lab 02/21/22 0527 02/22/22 0435 02/23/22 0529 02/24/22 0721 02/26/22 0556  NA 139 140 138 143 137  K 4.8 5.2* 4.4 4.2 4.4  CL 110 111 105 111 103  CO2 '22 23 25 26 24  '$ GLUCOSE 203* 219* 192* 119* 284*  BUN 29* 29* 26* 30* 37*  CREATININE 0.96 0.96 0.90 0.83 0.89  CALCIUM 8.3* 8.4* 8.4* 8.6* 8.2*   Liver  Function Tests: No results for input(s): "AST", "ALT", "ALKPHOS", "BILITOT", "PROT", "ALBUMIN" in the last 168 hours. No results for input(s): "LIPASE", "AMYLASE" in the last 168 hours. No results for input(s): "AMMONIA" in the last 168 hours. CBC: Recent Labs  Lab 02/22/22 0435  WBC 11.3*  HGB 9.3*  HCT 28.9*  MCV 88.7  PLT 298   Cardiac Enzymes: No results for input(s): "CKTOTAL", "CKMB", "CKMBINDEX", "TROPONINI" in the last 168  hours. BNP: Invalid input(s): "POCBNP" CBG: Recent Labs  Lab 02/26/22 0842 02/26/22 1132 02/26/22 1629 02/26/22 2106 02/27/22 0738  GLUCAP 221* 287* 348* 255* 60*   D-Dimer No results for input(s): "DDIMER" in the last 72 hours. Hgb A1c No results for input(s): "HGBA1C" in the last 72 hours. Lipid Profile No results for input(s): "CHOL", "HDL", "LDLCALC", "TRIG", "CHOLHDL", "LDLDIRECT" in the last 72 hours. Thyroid function studies No results for input(s): "TSH", "T4TOTAL", "T3FREE", "THYROIDAB" in the last 72 hours.  Invalid input(s): "FREET3" Anemia work up No results for input(s): "VITAMINB12", "FOLATE", "FERRITIN", "TIBC", "IRON", "RETICCTPCT" in the last 72 hours. Urinalysis    Component Value Date/Time   COLORURINE YELLOW (A) 02/09/2022 1327   APPEARANCEUR HAZY (A) 02/09/2022 1327   APPEARANCEUR Hazy 04/04/2011 1935   LABSPEC 1.013 02/09/2022 1327   LABSPEC >1.060 04/04/2011 1935   PHURINE 5.0 02/09/2022 1327   GLUCOSEU 50 (A) 02/09/2022 1327   GLUCOSEU 50 mg/dL 04/04/2011 1935   HGBUR NEGATIVE 02/09/2022 1327   BILIRUBINUR NEGATIVE 02/09/2022 1327   BILIRUBINUR Negative 04/04/2011 1935   KETONESUR NEGATIVE 02/09/2022 [place] 02/09/2022 1327   NITRITE NEGATIVE 02/09/2022 1327   LEUKOCYTESUR NEGATIVE 02/09/2022 1327   LEUKOCYTESUR Negative 04/04/2011 1935   Sepsis Labs Recent Labs  Lab 02/22/22 0435  WBC 11.3*   Microbiology Recent Results (from the past 240 hour(s))  MRSA Next Gen by PCR, Nasal      Status: None   Collection Time: 02/20/22  9:35 PM   Specimen: Nasal Mucosa; Nasal Swab  Result Value Ref Range Status   MRSA by PCR Next Gen NOT DETECTED NOT DETECTED Final    Comment: (NOTE) The GeneXpert MRSA Assay (FDA approved for NASAL specimens only), is one component of a comprehensive MRSA colonization surveillance program. It is not intended to diagnose MRSA infection nor to guide or monitor treatment for MRSA infections. Test performance is not FDA approved in patients less than 32 years old. Performed at [facility], [address] [place], [place] [address]    Imaging CT HEAD WO CONTRAST (5MM)  Result Date: 02/09/2022 CLINICAL DATA:  Seizure disorder.  Clinical change. EXAM: CT HEAD WITHOUT CONTRAST TECHNIQUE: Contiguous axial images were obtained from the base of the skull through the vertex without intravenous contrast. RADIATION DOSE REDUCTION: This exam was performed according to the departmental dose-optimization program which includes automated exposure control, adjustment of the mA and/or kV according to patient size and/or use of iterative reconstruction technique. COMPARISON:  CT brain 12/10/2021 FINDINGS: Brain: There is mild-to-moderate cortical atrophy, unchanged from prior and within normal limits for patient age. The ventricles are normal in configuration. The basilar cisterns are patent. There is again low-density and volume loss from a left posterior MCA territory infarct, unchanged. Left cerebellar infarct is also unchanged. No mass, mass effect, or midline shift. No acute intracranial hemorrhage is seen. No abnormal extra-axial fluid collection. Moderate periventricular and subcortical white matter patchy hypodensities are not significant changed, likely chronic ischemic white matter changes. Otherwise, there is preservation of the normal cortical gray-white interface without CT evidence of an acute major vascular territorial cortical based infarction.  Vascular: No hyperdense vessel or unexpected calcification. Skull: Normal. Negative for fracture or focal lesion. Sinuses/Orbits: The visualized orbits are unremarkable. The visualized paranasal sinuses and mastoid air cells are clear. Other: None. IMPRESSION: 1. No acute intracranial process. 2. Stable posterior left MCA territory and left cerebellar remote infarcts. 3. Stable moderate chronic ischemic white matter changes.  4. Stable mild-to-moderate cortical atrophy, within normal limits for patient age. Electronically Signed   By: Yvonne Kendall M.D.   On: 02/09/2022 17:59   DG Chest Port 1 View  Result Date: 02/09/2022 CLINICAL DATA:  Sepsis. EXAM: PORTABLE CHEST 1 VIEW COMPARISON:  December 09, 2021. FINDINGS: Stable cardiomediastinal silhouette. Stable right basilar atelectasis or scarring is noted. Left lung is unremarkable. Bony thorax is unremarkable. IMPRESSION: Stable right basilar atelectasis or scarring is noted. Electronically Signed   By: Marijo Conception M.D.   On: 02/09/2022 13:42      Time coordinating discharge: over 30 minutes  SIGNED:  Emeterio Reeve DO [facility]

## 2022-02-27 NOTE — TOC Progression Note (Addendum)
Transition of Care [facility]) - Progression Note    Patient Details  Name: Brittany [name] MRN: [id number] Date of Birth: 1960/05/11  Transition of Care [facility]) CM/SW [place], LCSW Phone Number: 02/27/2022, 11:41 AM  Clinical Narrative:   Faxed discharge paperwork to [facility] for review. Their in-house provider will do home health PT and OT. Waiting on them to determine if they can pick her up or if she will need EMS. Facility and DSS are aware that if she goes by EMS she will get a bill. [place] confirmed they are her legal guardian.   12:15 pm: Per guardian, they are working on some logistical/financial issues with the ALF before they facility can accept her back. Guardian believes this will be resolved today.  3:40 pm: Guardian does not have any updates yet.    Barriers to Discharge:  (facility issues assessing patient in timely manner - toc supervisor aware)  Expected Discharge Plan and Services         Expected Discharge Date: 02/27/22                                     Social Determinants of Health ([place]) Interventions [place]: No Food Insecurity (12/30/2021)  Housing: Low Risk  (12/30/2021)  Transportation Needs: No Transportation Needs (12/30/2021)  Utilities: Not At Risk (12/30/2021)  Tobacco Use: High Risk (12/10/2021)    Readmission Risk Interventions    12/29/2021    1:48 PM 09/22/2021    1:24 PM 03/06/2021    9:13 AM  Readmission Risk Prevention Plan  Transportation Screening Complete Complete Complete  Medication Review Press photographer) Complete Complete Complete  PCP or Specialist appointment within 3-5 days of discharge  Complete Complete  HRI or Home Care Consult Complete Complete   SW Recovery Care/Counseling Consult Complete Complete   Palliative Care Screening Not Applicable Not Applicable Not [place] Not Applicable Complete Not Applicable
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
## 2022-02-27 NOTE — Progress Notes (Signed)
Patient refused AM vitals, stating she "wasn't in the mood right now". RN returned once more 20 minutes later and patient stated the same. Patient not in any type of acute distress at this time, will continue to monitor at bedside.

## 2022-02-27 NOTE — Care Management Important Message (Signed)
Important Message  Patient Details  Name: Brittany [name] MRN: [id number] Date of Birth: 1960/06/11   Medicare Important Message Given:  Yes  Reviewed Medicare IM with [name], legal guardian, at [phone].  Copy of Medicare IM faxed to Sentrell's attention at [phone].    [name] 02/27/2022, 12:12 PM

## 2022-02-28 DIAGNOSIS — R569 Unspecified convulsions: Secondary | ICD-10-CM | POA: Diagnosis not present

## 2022-02-28 LAB — GLUCOSE, CAPILLARY
Glucose-Capillary: 148 mg/dL — ABNORMAL HIGH (ref 70–99)
Glucose-Capillary: 261 mg/dL — ABNORMAL HIGH (ref 70–99)
Glucose-Capillary: 130 mg/dL — ABNORMAL HIGH (ref 70–99)
Glucose-Capillary: 183 mg/dL — ABNORMAL HIGH (ref 70–99)
Glucose-Capillary: 33 mg/dL — CL (ref 70–99)

## 2022-02-28 NOTE — TOC Progression Note (Addendum)
Transition of Care [facility]) - Progression Note    Patient Details  Name: Brittany [name] MRN: [id number] Date of Birth: 1960/05/04  Transition of [facility]) CM/SW [place], LCSW Phone Number: 02/28/2022, 9:36 AM  Clinical Narrative: Left voicemail for guardian to see if issues have been resolved.    11:32 am: DSS supervisor is talking with ALF and will notify CSW when there is an update.  12:09 pm: Received call from legal guardian. They are still working out patient returning to ALF.  3:10 pm: Left voicemail for guardian to check updates.    Barriers to Discharge:  (facility issues assessing patient in timely manner - toc supervisor aware)  Expected Discharge Plan and Services         Expected Discharge Date: 02/27/22                                     Social Determinants of Health ([place]) Interventions [place]: No Food Insecurity (12/30/2021)  Housing: Low Risk  (12/30/2021)  Transportation Needs: No Transportation Needs (12/30/2021)  Utilities: Not At Risk (12/30/2021)  Tobacco Use: High Risk (12/10/2021)    Readmission Risk Interventions    12/29/2021    1:48 PM 09/22/2021    1:24 PM 03/06/2021    9:13 AM  Readmission Risk Prevention Plan  Transportation Screening Complete Complete Complete  Medication Review Press photographer) Complete Complete Complete  PCP or Specialist appointment within 3-5 days of discharge  Complete Complete  HRI or Home Care Consult Complete Complete   SW Recovery Care/Counseling Consult Complete Complete   Palliative Care Screening Not Applicable Not Applicable Not [place] Not Applicable Complete Not Applicable

## 2022-02-28 NOTE — Progress Notes (Signed)
PT Cancellation Note  Patient Details Name: Brittany [name] MRN: [id number] DOB: 1960-10-22   Cancelled Treatment:     Pt received side sitting EOB sleeping, would not stay awake to participate and refused mobility. Continue per POC next available date/time   Josie Dixon 02/28/2022, 2:51 PM

## 2022-02-28 NOTE — Progress Notes (Signed)
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 difficile infection, who presented to the hospital from SNF with hypoglycemia and seizures.  Reportedly, glucose levels was under 40s and dropped into the 30s.  was started on 10% dextrose infusion by EMS and brought to the emergency department.  Continued to have labile Glc and recurrent/refractory hyperkalemia, Nephrology was consulted to manage hyperkalemia and insulin regimen adjusted.   Hyperkalemia slowly improved to Potassium 4.4 on 01/12  stable for discharge however needs evaluation by her ALF which are unable to send anyone on Friday 01/12, see TOC notes.  Unfortunately patient will need to stay here over the weekend. / pending eval by facility  fall risk - requiring sitter and temporary restraints on 01/14 but she is much more calm 01/15. 01/16 ALF finally accepter her back, discharge reordered.    Assessment & Plan:   Principal Problem:   Seizure ([place]) Active Problems:   Hyperkalemia   Acute metabolic encephalopathy   Hypoglycemia   Hypothermia   Hypotension   Chronic diastolic CHF (congestive heart failure) (HCC)   History of stroke   HLD (hyperlipidemia)   Depression with anxiety   COPD (chronic obstructive pulmonary disease) (H[facility])   Tobacco dependence   Elevated lactic acid level   HTN (hypertension)   Hyperglycemia due to type 2 diabetes mellitus ([place])   Dementia without behavioral disturbance (HCC)   Seizures prior to admission:  This was probably due to hypoglycemia.   No seizures on admission.   Continue Depakote.     Type II DM with hyperglycemia, recent  hypoglycemia, labile glucose levels:  Hemoglobin A1c on 02/14/2022 was 11.  Hemoglobin A1c was 13.2 on 10/30/2021.  Glucose level dropped to 66 on the morning of 02/20/2022.   Resume home regimen. Hypoglycemia resolved.   Refractory, recurrent hyperkalemia:  Improving - resolved and K stable. Continue Lasix, Lokelma, fludrocortisone and sodium bicarbonate per nephrology Check intermittent BMP - follow outpatient    Hypotension: Improved.   Adrenal insufficiency was ruled out with ACTH stimulation test. Cardizem and midodrine have been discontinued. Monitor BP closely outpatient    Acute metabolic encephalopathy, lethargy: Resolved Dementia w/ behavioral disturbance Improved   General weakness: PT recommends home health therapy.   Other comorbidities include chronic diastolic CHF, depression, anxiety, hypertension, tobacco use disorder, dementia,    Lactic acidosis, hypothermia: Resolved      DVT prophylaxis: Lovenox Code Status: Full code Family Communication:  No family at bed side. Disposition Plan:  Admitted for seizures secondary to hypoglycemia.  Now improved.  Patient is being discharged back to ALF.   Consultants:  None  Procedures: None  Antimicrobials:None   Subjective: Patient seen and examined at bedside.  Overnight events noted.   Patient reports doing better and wants to be discharged back to ALF.  Objective: Vitals:   02/27/22 1933 02/28/22 0500 02/28/22 0753 02/28/22 1539  BP: 103/70 110/85 (!) 136/99 92/78  Pulse: 100 84 75 99  Resp: '18 17 17 17  '$ Temp: 98 F (36.7 C) 98 F (36.7 C) 98.6 F (37 C) 98.7 F (37.1 C)  TempSrc: Oral Axillary    SpO2: 98% 96% 94% 97%  Weight:  Height:        Intake/Output Summary (Last 24 hours) at 02/28/2022 1653 Last data filed at 02/27/2022 2308 Gross per 24 hour  Intake --  Output 500 ml  Net -500 ml   Filed Weights   02/22/22 0520 02/25/22 0500 02/26/22 0500  Weight: 54.4 kg 54.4 kg 56.5 kg     Examination:  General exam: Appears comfortable, not in any acute distress.  Deconditioned Respiratory system: CTA bilaterally, respiratory for normal, RR 13. Cardiovascular system: S1 & S2 heard, regular rate and rhythm, no murmur. Gastrointestinal system: Abdomen is soft, non tender, non distended, BS+ Central nervous system: Alert and oriented x 3. No focal neurological deficits. Extremities: No edema, no cyanosis, no clubbing. Skin: No rashes, lesions or ulcers Psychiatry: Judgement and insight appear normal. Mood & affect appropriate.     Data Reviewed: I have personally reviewed following labs and imaging studies  CBC: Recent Labs  Lab 02/22/22 0435  WBC 11.3*  HGB 9.3*  HCT 28.9*  MCV 88.7  PLT 751   Basic Metabolic Panel: Recent Labs  Lab 02/22/22 0435 02/23/22 0529 02/24/22 0721 02/26/22 0556  NA 140 138 143 137  K 5.2* 4.4 4.2 4.4  CL 111 105 111 103  CO2 '23 25 26 24  '$ GLUCOSE 219* 192* 119* 284*  BUN 29* 26* 30* 37*  CREATININE 0.96 0.90 0.83 0.89  CALCIUM 8.4* 8.4* 8.6* 8.2*   GFR: Estimated Creatinine Clearance: 51.6 mL/min (by C-G formula based on SCr of 0.89 mg/dL). Liver Function Tests: No results for input(s): "AST", "ALT", "ALKPHOS", "BILITOT", "PROT", "ALBUMIN" in the last 168 hours. No results for input(s): "LIPASE", "AMYLASE" in the last 168 hours. No results for input(s): "AMMONIA" in the last 168 hours. Coagulation Profile: No results for input(s): "INR", "PROTIME" in the last 168 hours. Cardiac Enzymes: No results for input(s): "CKTOTAL", "CKMB", "CKMBINDEX", "TROPONINI" in the last 168 hours. BNP (last 3 results) No results for input(s): "PROBNP" in the last 8760 hours. HbA1C: No results for input(s): "HGBA1C" in the last 72 hours. CBG: Recent Labs  Lab 02/27/22 1619 02/27/22 2123 02/28/22 0756 02/28/22 1136 02/28/22 1641  GLUCAP 474* 98 148* 261* 130*   Lipid Profile: No results for input(s): "CHOL", "HDL", "LDLCALC",  "TRIG", "CHOLHDL", "LDLDIRECT" in the last 72 hours. Thyroid Function Tests: No results for input(s): "TSH", "T4TOTAL", "FREET4", "T3FREE", "THYROIDAB" in the last 72 hours. Anemia Panel: No results for input(s): "VITAMINB12", "FOLATE", "FERRITIN", "TIBC", "IRON", "RETICCTPCT" in the last 72 hours. Sepsis Labs: No results for input(s): "PROCALCITON", "LATICACIDVEN" in the last 168 hours.  Recent Results (from the past 240 hour(s))  MRSA Next Gen by PCR, Nasal     Status: None   Collection Time: 02/20/22  9:35 PM   Specimen: Nasal Mucosa; Nasal Swab  Result Value Ref Range Status   MRSA by PCR Next Gen NOT DETECTED NOT DETECTED Final    Comment: (NOTE) The GeneXpert MRSA Assay (FDA approved for NASAL specimens only), is one component of a comprehensive MRSA colonization surveillance program. It is not intended to diagnose MRSA infection nor to guide or monitor treatment for MRSA infections. Test performance is not FDA approved in patients less than 73 years old. Performed at [facility], [address] [place], [place] [address]     Radiology Studies: No results found.  Scheduled Meds:  aspirin EC  81 mg Oral Daily   atorvastatin  40 mg Oral QHS   buPROPion  150 mg Oral Daily  divalproex  500 mg Oral BID   enoxaparin (LOVENOX) injection  40 mg Subcutaneous Q24H   famotidine  20 mg Oral Daily   feeding supplement (NEPRO CARB STEADY)  237 mL Oral BID BM   fludrocortisone  0.05 mg Oral Daily   furosemide  20 mg Oral Daily   insulin aspart  0-5 Units Subcutaneous QHS   insulin aspart  0-9 Units Subcutaneous TID WC   insulin aspart  4 Units Subcutaneous TID WC   insulin glargine-yfgn  10 Units Subcutaneous BID   melatonin  2.5 mg Oral QHS   mirtazapine  15 mg Oral QHS   multivitamin with minerals  1 tablet Oral Daily   nicotine  21 mg Transdermal Daily   QUEtiapine  50 mg Oral QHS   rOPINIRole  0.5 mg Oral QHS   sodium bicarbonate  1,300 mg Oral BID   sodium  chloride flush  10-40 mL Intracatheter Q12H   sodium zirconium cyclosilicate  10 g Oral BID   Continuous Infusions:   LOS: 13 days    Time spent: 50 mins    Juanitta Earnhardt, MD [facility]   If 7PM-7AM, please contact night-coverage

## 2022-02-28 NOTE — Progress Notes (Signed)
Mobility Specialist - Progress Note   02/28/22 1021  Mobility  Activity Dangled on edge of bed  Level of Assistance Independent  Assistive Device None  Activity Response Tolerated well  $Mobility charge 1 Mobility   MS responding to bed alarm. Pt completed bed mobility indep and transferred to EOB to eat breakfast. Pt left sitting EOB with alarm set and needs within reach.   Candie Mile Mobility Specialist 02/28/22 10:24 AM

## 2022-03-01 DIAGNOSIS — R569 Unspecified convulsions: Secondary | ICD-10-CM | POA: Diagnosis not present

## 2022-03-01 LAB — GLUCOSE, CAPILLARY: Glucose-Capillary: 159 mg/dL — ABNORMAL HIGH (ref 70–99)

## 2022-03-01 NOTE — Progress Notes (Addendum)
   02/28/22 2034  Provider Notification  Provider Name/Title B. Randol Kern NP  Date Provider Notified 02/28/22  Time Provider Notified 2040  Method of Notification Page (Secure chat)  Notification Reason Critical Result (CBG 33)  Test performed and critical result CBG 33 mg/dl  Date Critical Result Received 02/28/22  Time Critical Result Received 2034  Provider response Other (Comment) (Hypoglycemia protocol activated)  Date of Provider Response 02/28/22  Time of Provider Response 2040   2051: CBG rechecked: '183mg'$ /dl post oral 8oz of orange juice. Patient resting well. No signs/symptoms of hypoglycemia.

## 2022-03-01 NOTE — Progress Notes (Signed)
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 difficile infection, who presented to the hospital from SNF with hypoglycemia and seizures.  Reportedly, glucose levels was under 40s and dropped into the 30s.  was started on 10% dextrose infusion by EMS and brought to the emergency department.  Continued to have labile Glc and recurrent/refractory hyperkalemia, Nephrology was consulted to manage hyperkalemia and insulin regimen adjusted.   Hyperkalemia slowly improved to Potassium 4.4 on 01/12  stable for discharge however needs evaluation by her ALF which are unable to send anyone on Friday 01/12, see TOC notes.  Unfortunately patient will need to stay here over the weekend. / pending eval by facility  fall risk - requiring sitter and temporary restraints on 01/14 but she is much more calm 01/15. 01/16 ALF finally accepter her back, discharge reordered. 01/18: Patient is being discharged back to assisted living facility.   Assessment & Plan:   Principal Problem:   Seizure ([place]) Active Problems:   Hyperkalemia   Acute metabolic encephalopathy   Hypoglycemia   Hypothermia   Hypotension   Chronic diastolic CHF (congestive heart failure) (HCC)   History of stroke   HLD (hyperlipidemia)   Depression with anxiety   COPD (chronic obstructive pulmonary disease) (HCC)   Tobacco dependence   Elevated lactic acid level   HTN (hypertension)   Hyperglycemia due to type 2 diabetes mellitus ([place])   Dementia without behavioral disturbance (HCC)  Seizures prior to admission:  This was probably due to hypoglycemia.   No seizures on admission.   Continue  Depakote.     Type II DM with hyperglycemia, recent hypoglycemia, labile glucose levels:  Hemoglobin A1c on 02/14/2022 was 11.  Hemoglobin A1c was 13.2 on 10/30/2021.  Glucose level dropped to 66 on the morning of 02/20/2022.   Resume home regimen. Hypoglycemia resolved.   Refractory, recurrent hyperkalemia:  Improving - resolved and K stable. Continue Lasix, Lokelma, fludrocortisone and sodium bicarbonate per nephrology Check intermittent BMP - follow outpatient    Hypotension: Improved.   Adrenal insufficiency was ruled out with ACTH stimulation test. Cardizem and midodrine have been discontinued. Monitor BP closely outpatient    Acute metabolic encephalopathy, lethargy: Resolved Dementia w/ behavioral disturbance Improved   General weakness: PT recommends home health therapy.   Other comorbidities include chronic diastolic CHF, depression, anxiety, hypertension, tobacco use disorder, dementia,    Lactic acidosis, hypothermia: Resolved      DVT prophylaxis: Lovenox Code Status: Full code Family Communication:  No family at bed side. Disposition Plan:  Admitted for seizures secondary to hypoglycemia.  Now improved.  Patient is being discharged back to ALF today.  Consultants:  Nephrology  Procedures: None  Antimicrobials:None  Subjective: Patient seen and examined at bedside.  Overnight events noted.   Patient reports doing better.  She is waiting to be discharged to assisted living facility.  Objective: Vitals:   02/28/22 1945 03/01/22 0452 03/01/22 0500 03/01/22 0751  BP: 97/71 98/75  108/75  Pulse: 99 89  81  Resp: '20 18  16  '$ Temp: (!) 97.5 F (36.4 C) 98 F (36.7 C)  (!) 97.5 F (36.4 C)  TempSrc: Oral   Oral  SpO2: 98% 97%  95%  Weight:   56 kg   Height:        Intake/Output Summary (Last 24 hours) at 03/01/2022 1142 Last data filed at 02/28/2022 2200 Gross per 24 hour  Intake 240 ml  Output 250 ml  Net -10 ml   Filed Weights   02/25/22 0500  02/26/22 0500 03/01/22 0500  Weight: 54.4 kg 56.5 kg 56 kg    Examination:  General exam: Appears comfortable, not in any acute distress, deconditioned. Respiratory system: CTA bilaterally, respiratory for normal, RR 13. Cardiovascular system: S1 & S2 heard, regular rate and rhythm, no murmur. Gastrointestinal system: Abdomen is soft, non tender, non distended, BS+ Central nervous system: Alert and oriented x 3. No focal neurological deficits. Extremities: No edema, no cyanosis, no clubbing. Skin: No rashes, lesions or ulcers Psychiatry: Judgement and insight appear normal. Mood & affect appropriate.     Data Reviewed: I have personally reviewed following labs and imaging studies  CBC: No results for input(s): "WBC", "NEUTROABS", "HGB", "HCT", "MCV", "PLT" in the last 168 hours.  Basic Metabolic Panel: Recent Labs  Lab 02/23/22 0529 02/24/22 0721 02/26/22 0556  NA 138 143 137  K 4.4 4.2 4.4  CL 105 111 103  CO2 '25 26 24  '$ GLUCOSE 192* 119* 284*  BUN 26* 30* 37*  CREATININE 0.90 0.83 0.89  CALCIUM 8.4* 8.6* 8.2*   GFR: Estimated Creatinine Clearance: 51.4 mL/min (by C-G formula based on SCr of 0.89 mg/dL). Liver Function Tests: No results for input(s): "AST", "ALT", "ALKPHOS", "BILITOT", "PROT", "ALBUMIN" in the last 168 hours. No results for input(s): "LIPASE", "AMYLASE" in the last 168 hours. No results for input(s): "AMMONIA" in the last 168 hours. Coagulation Profile: No results for input(s): "INR", "PROTIME" in the last 168 hours. Cardiac Enzymes: No results for input(s): "CKTOTAL", "CKMB", "CKMBINDEX", "TROPONINI" in the last 168 hours. BNP (last 3 results) No results for input(s): "PROBNP" in the last 8760 hours. HbA1C: No results for input(s): "HGBA1C" in the last 72 hours. CBG: Recent Labs  Lab 02/28/22 1136 02/28/22 1641 02/28/22 2034 02/28/22 2051 03/01/22 0752  GLUCAP 261* 130* 33* 183* 159*   Lipid Profile: No results for input(s): "CHOL",  "HDL", "LDLCALC", "TRIG", "CHOLHDL", "LDLDIRECT" in the last 72 hours. Thyroid Function Tests: No results for input(s): "TSH", "T4TOTAL", "FREET4", "T3FREE", "THYROIDAB" in the last 72 hours. Anemia Panel: No results for input(s): "VITAMINB12", "FOLATE", "FERRITIN", "TIBC", "IRON", "RETICCTPCT" in the last 72 hours. Sepsis Labs: No results for input(s): "PROCALCITON", "LATICACIDVEN" in the last 168 hours.  Recent Results (from the past 240 hour(s))  MRSA Next Gen by PCR, Nasal     Status: None   Collection Time: 02/20/22  9:35 PM   Specimen: Nasal Mucosa; Nasal Swab  Result Value Ref Range Status   MRSA by PCR Next Gen NOT DETECTED NOT DETECTED Final    Comment: (NOTE) The GeneXpert MRSA Assay (FDA approved for NASAL specimens only), is one component of a comprehensive MRSA colonization surveillance program. It is not intended to diagnose MRSA infection nor to guide or monitor treatment for MRSA infections. Test performance is not FDA approved in patients less than 82 years old. Performed at [facility], [address], [place], [place] [address]     Radiology Studies: No results found.  Scheduled Meds:  aspirin EC  81 mg Oral Daily   atorvastatin  40 mg Oral QHS   buPROPion  150 mg Oral Daily   divalproex  500 mg Oral BID  enoxaparin (LOVENOX) injection  40 mg Subcutaneous Q24H   famotidine  20 mg Oral Daily   feeding supplement (NEPRO CARB STEADY)  237 mL Oral BID BM   fludrocortisone  0.05 mg Oral Daily   furosemide  20 mg Oral Daily   insulin aspart  0-5 Units Subcutaneous QHS   insulin aspart  0-9 Units Subcutaneous TID WC   insulin aspart  4 Units Subcutaneous TID WC   insulin glargine-yfgn  10 Units Subcutaneous BID   melatonin  2.5 mg Oral QHS   mirtazapine  15 mg Oral QHS   multivitamin with minerals  1 tablet Oral Daily   nicotine  21 mg Transdermal Daily   QUEtiapine  50 mg Oral QHS   rOPINIRole  0.5 mg Oral QHS   sodium bicarbonate  1,300 mg Oral  BID   sodium chloride flush  10-40 mL Intracatheter Q12H   sodium zirconium cyclosilicate  10 g Oral BID   Continuous Infusions:   LOS: 14 days    Time spent: 35 mins    Chalese Peach, MD [facility]   If 7PM-7AM, please contact night-coverage

## 2022-03-01 NOTE — TOC Transition Note (Addendum)
Transition of Care [facility]) - CM/SW Discharge Note   Patient Details  Name: Brittany [name] MRN: [id number] Date of Birth: 08-31-1960  [facility] CM/SW Contact:  Candie Chroman, LCSW Phone Number: 03/01/2022, 11:25 AM   Clinical Narrative:  Patient has orders to discharge back to [facility] today. ALF staff will be here in 15 minutes to pick her up. Legal guardian is aware. No further concerns. CSW signing off.  Final next level of care: Assisted Living (with home health therapy) Barriers to Discharge: Barriers Resolved   Patient Goals and CMS Choice      Discharge Placement                  Patient to be transferred to facility by: [facility] ALF Name of family member notified: Deneen Harts (Legal guardian) Patient and family notified of of transfer: 03/01/22  Discharge Plan and Services Additional resources added to the After Visit Summary for                                       Social Determinants of Health (SDOH) Interventions [place]: No Food Insecurity (12/30/2021)  Housing: Low Risk  (12/30/2021)  Transportation Needs: No Transportation Needs (12/30/2021)  Utilities: Not At Risk (12/30/2021)  Tobacco Use: High Risk (12/10/2021)     Readmission Risk Interventions    12/29/2021    1:48 PM 09/22/2021    1:24 PM 03/06/2021    9:13 AM  Readmission Risk Prevention Plan  Transportation Screening Complete Complete Complete  Medication Review Press photographer) Complete Complete Complete  PCP or Specialist appointment within 3-5 days of discharge  Complete Complete  HRI or [place] Complete Complete   SW Recovery Care/Counseling Consult Complete Complete   Palliative Care Screening Not Applicable Not Applicable Not [name] Not Applicable Complete Not Applicable

## 2022-03-01 NOTE — TOC Progression Note (Addendum)
Transition of Care [facility]) - Progression Note    Patient Details  Name: Brittany [name] MRN: [id number] Date of Birth: [birth year]  Transition of Care [facility]) CM/SW [place], LCSW Phone Number: 03/01/2022, 9:08 AM  Clinical Narrative:  Received call from guardian late yesterday. [name] is planning on picking up patient today between 9:00 am-2:00 pm. Sent secure chat to MD requesting updated discharge summary.    Barriers to Discharge:  (facility issues assessing patient in timely manner - toc supervisor aware)  Expected Discharge Plan and Services         Expected Discharge Date: 02/27/22                                     Social Determinants of Health ([place]) Interventions [place]: No Food Insecurity (12/30/2021)  Housing: Low Risk  (12/30/2021)  Transportation Needs: No Transportation Needs (12/30/2021)  Utilities: Not At Risk (12/30/2021)  Tobacco Use: High Risk (12/10/2021)    Readmission Risk Interventions    12/29/2021    1:48 PM 09/22/2021    1:24 PM 03/06/2021    9:13 AM  Readmission Risk Prevention Plan  Transportation Screening Complete Complete Complete  Medication Review Press photographer) Complete Complete Complete  PCP or Specialist appointment within 3-5 days of discharge  Complete Complete  HRI or Home Care Consult Complete Complete   SW Recovery Care/Counseling Consult Complete Complete   Palliative Care Screening Not Applicable Not Applicable Not [place] Not Applicable Complete Not Applicable

## 2022-03-26 ENCOUNTER — Non-Acute Institutional Stay: Payer: Medicare Other | Admitting: Hospice

## 2022-03-26 DIAGNOSIS — F419 Anxiety disorder, unspecified: Secondary | ICD-10-CM

## 2022-03-26 DIAGNOSIS — Z515 Encounter for palliative care: Secondary | ICD-10-CM

## 2022-03-26 DIAGNOSIS — R569 Unspecified convulsions: Secondary | ICD-10-CM

## 2022-03-26 DIAGNOSIS — I5032 Chronic diastolic (congestive) heart failure: Secondary | ICD-10-CM

## 2022-03-26 DIAGNOSIS — E1165 Type 2 diabetes mellitus with hyperglycemia: Secondary | ICD-10-CM

## 2022-03-26 NOTE — Progress Notes (Signed)
[place] Consult Note Telephone: [phone]  Fax: [phone]    PATIENT NAME: Brittany [name] [id number] [place] [address]   [phone] (home)  DOB: April 01, 1960 MRN: [id number] PRIMARY CARE PROVIDER:    [name], NP,  [place] [address] [phone]  REFERRING PROVIDER:   [name], NP [place],  [place] [address] [phone]  RESPONSIBLE PARTY:    Contact Information     Name Relation Home Work Mobile   Brittany [name] (DSS),Brittany [name] Legal Guardian  [phone] [place]  [phone]   Wille Brittany [name] [phone] Other   [phone]   Brittany [name] [phone]  [phone]   [facility] Mother   [phone]   Brittany [name] Sister   [phone]   Brittany [name] Sister   [phone]   Brittany [name] ([place]) Other  [phone]        I met face to face with patient in the facility. Palliative Care was asked to follow this patient by consultation request of  Brittany Ripple, NP to address advance care planning and complex medical decision making. This is a follow up visit. NP called legal guardian - Brittany [name] and updated her on visit.  She expressed appreciation for the call.                                   ASSESSMENT AND PLAN / RECOMMENDATIONS:  CODE STATUS: Full Code  Symptom Management/Plan: Seizure: Recent hospitalization for seizure 02/09/22 - 03/01/2022. No seizure since returned to faicilty. Continue Deparkote.  Seizure precautions. Anxiety, depression-patient with labile mood. Continue mirtazapine QHS. She expresses displeasure with being at facility. Recommend counseling, psychiatry involvement.  Dementia: without behvioral disturbance.  CHF: Continue furosemide.  No added salt.  Monitor weight and report weight gain of 2 pounds in a day or 5 pounds in a week. Type 2  diabetes mellitus: A1c not at goal, trending in the right direction.  Hemoglobin A1c on 02/14/2022 was 11.  Hemoglobin A1c was 13.2 on 10/30/2021.  Continue metformin, insulin as ordered. Repeat A1c every 3 months. Monitor CBG as ordered. No concentrated sweets.  Protin caloric malnutrition: Albumin 2.5 three months ago per epic chart.  Current weight is 118 pounds, height 5 feet.  Offered assistance during meals to ensure adequate oral intake.  Routine CBC CMP.  Follow up Palliative Care Visit: Palliative care will continue to follow for complex medical decision making, advance care planning, and clarification of goals. Return in 6-8 weeks or prn.  PPS: 50%  HOSPICE ELIGIBILITY/DIAGNOSIS: TBD  Chief Complaint: Palliative Medicine follow up visit.   HISTORY OF PRESENT ILLNESS:  Brittany [name] is a 62 y.o. 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  Patient resting in bed during visit, denies pain/discomfort.  Nursing with no concerns today.  Rest of 10 point ROS asked and negative. History obtained from review of EMR, discussion with primary team, and interview with family, facility staff/caregiver and/or Brittany [name].  I reviewed available labs, medications, imaging, studies and related documents from the EMR.  Records reviewed and summarized above.   Physical Exam: Pulse 72, resp 18, sats 95% on room air Constitutional: NAD General: frail appearing EYES: anicteric sclera,  lids intact, no discharge  ENMT: intact hearing, oral mucous membranes moist CV: S1S2, RRR, no LE edema Pulmonary: LCTA, no increased work of breathing, no cough, room air Abdomen: normo-active BS + 4 quadrants, soft and non tender, colostomy MSK: no sarcopenia, moves all extremities, ambulatory Skin: warm and dry, no  rashes or wounds on visible skin Neuro: +generalized weakness Psych: non-anxious affect, A and O to person and place, forgetful Hem/lymph/immuno: no widespread bruising  I spent 60 minutes providing this consultation; this includes time spent with patient/family, chart review and documentation. [name] than 50% of the time in this consultation was spent on counseling and coordinating.  Thank you for the opportunity to participate in the care of Brittany [name]. Please call our office at [phone] if we can be of additional assistance.   Teodoro Spray, NP

## 2022-04-18 ENCOUNTER — Non-Acute Institutional Stay: Payer: Medicare Other | Admitting: Hospice

## 2022-04-18 DIAGNOSIS — Z515 Encounter for palliative care: Secondary | ICD-10-CM

## 2022-04-18 DIAGNOSIS — R569 Unspecified convulsions: Secondary | ICD-10-CM

## 2022-04-18 DIAGNOSIS — E1165 Type 2 diabetes mellitus with hyperglycemia: Secondary | ICD-10-CM

## 2022-04-18 DIAGNOSIS — F419 Anxiety disorder, unspecified: Secondary | ICD-10-CM

## 2022-04-18 DIAGNOSIS — I5032 Chronic diastolic (congestive) heart failure: Secondary | ICD-10-CM

## 2022-04-18 NOTE — Progress Notes (Signed)
[place] Consult Note Telephone: [phone]  Fax: [phone]    PATIENT NAME: [name] [address] [place] [address]   [phone] (home)  DOB: 08/19/1960 MRN: [id number] PRIMARY CARE PROVIDER:    [name], NP,  [place] [facility] [place] [address] [phone]  REFERRING PROVIDER:    Lajuana Ripple, NP  RESPONSIBLE PARTY:    Contact Information     Name Relation Home Work Mobile   [place] (DSS),[place] Legal Guardian  [phone]   [place]  [phone]   Wille Glaser [phone] Other   [phone]   [place]er,Ericka Daughter [phone]  [phone]   [facility] Mother   [phone]   Edison Simon Sister   [phone]   Thornell Mule Sister   [phone]   Caroline More ([place]) Other  [phone]        I met face to face with patient in the facility. Palliative Care was asked to follow this patient by consultation request of  Lajuana Ripple, NP to address advance care planning and complex medical decision making. This is a follow up visit.                                    ASSESSMENT AND PLAN / RECOMMENDATIONS:  CODE STATUS: Full Code  Symptom Management/Plan: Seizure: Seizure precautions.  No report of seizure since last hospitalization for seizure 02/09/22 - 03/01/2022.  Continue Deparkote.  Seizure precautions.  Routine Depakote level.  Anxiety, depression-Continue mirtazapine QHS.  Psych consult as planned/as needed.  Dementia: without behvioral disturbance.  Continue ongoing supportive care. CHF: Continue furosemide.  No added salt.  Monitor weight and report weight gain of 2 pounds in a day or 5 pounds in a week.  Weight has been consistent in the past 2 months 2/5 is 118 pounds 3/5 is 117 pounds.  Type 2 diabetes mellitus: A1c not at goal, trending in the right direction.  Hemoglobin A1c on  02/14/2022 was 11.  Hemoglobin A1c was 13.2 on 10/30/2021.  ACHS.  Continue metformin, insulin as ordered. Repeat A1c every 3 months.   Protin caloric malnutrition: Albumin 2.5  - 4 months ago per epic chart.  Current weight is 117 pounds, height 5 feet.  Offered assistance during meals to ensure adequate oral intake.  Routine CBC CMP.  Follow up Palliative Care Visit: Palliative care will continue to follow for complex medical decision making, advance care planning, and clarification of goals. Return in 6-8 weeks or prn.  PPS: 50%  HOSPICE ELIGIBILITY/DIAGNOSIS: TBD  Chief Complaint: Palliative Medicine follow up visit.   HISTORY OF PRESENT ILLNESS:  Brittany [name] is a 62 y.o. 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  Patient resting in bed during visit, denies pain/discomfort.  Nursing with no concerns today.  Rest of 10 point ROS asked and negative. History obtained from review of EMR, discussion with primary team, and interview with family, facility staff/caregiver and/or Ms. Bauer.  I reviewed available labs, medications, imaging, studies and related documents from the EMR.  Records reviewed and summarized above.   I spent 60 minutes providing this consultation; this includes time spent with patient/family/clinical staff, chart review and documentation. More than 50% of the time in this consultation  was spent on counseling and coordinating.  Thank you for the opportunity to participate in the care of Brittany [name]. Please call our office at [phone] if we can be of additional assistance.   Teodoro Spray, NP

## 2022-05-13 ENCOUNTER — Inpatient Hospital Stay
Admission: EM | Admit: 2022-05-13 | Discharge: 2022-05-17 | DRG: 372 | Disposition: A | Discharge status: Skilled Nursing Facility | Payer: Medicare Other | Source: Skilled Nursing Facility | Attending: Internal Medicine | Admitting: Internal Medicine

## 2022-05-13 ENCOUNTER — Emergency Department: Payer: Medicare Other

## 2022-05-13 ENCOUNTER — Observation Stay: Payer: Medicare Other

## 2022-05-13 DIAGNOSIS — Z881 Allergy status to other antibiotic agents status: Secondary | ICD-10-CM

## 2022-05-13 DIAGNOSIS — T68XXXA Hypothermia, initial encounter: Secondary | ICD-10-CM

## 2022-05-13 DIAGNOSIS — G40909 Epilepsy, unspecified, not intractable, without status epilepticus: Secondary | ICD-10-CM | POA: Diagnosis present

## 2022-05-13 DIAGNOSIS — I639 Cerebral infarction, unspecified: Secondary | ICD-10-CM | POA: Diagnosis present

## 2022-05-13 DIAGNOSIS — K869 Disease of pancreas, unspecified: Secondary | ICD-10-CM | POA: Diagnosis present

## 2022-05-13 DIAGNOSIS — I110 Hypertensive heart disease with heart failure: Secondary | ICD-10-CM | POA: Diagnosis present

## 2022-05-13 DIAGNOSIS — E109 Type 1 diabetes mellitus without complications: Secondary | ICD-10-CM

## 2022-05-13 DIAGNOSIS — E871 Hypo-osmolality and hyponatremia: Secondary | ICD-10-CM | POA: Diagnosis present

## 2022-05-13 DIAGNOSIS — Z8673 Personal history of transient ischemic attack (TIA), and cerebral infarction without residual deficits: Secondary | ICD-10-CM

## 2022-05-13 DIAGNOSIS — R680 Hypothermia, not associated with low environmental temperature: Secondary | ICD-10-CM | POA: Diagnosis present

## 2022-05-13 DIAGNOSIS — E785 Hyperlipidemia, unspecified: Secondary | ICD-10-CM | POA: Diagnosis present

## 2022-05-13 DIAGNOSIS — R569 Unspecified convulsions: Secondary | ICD-10-CM

## 2022-05-13 DIAGNOSIS — L8990 Pressure ulcer of unspecified site, unspecified stage: Secondary | ICD-10-CM | POA: Diagnosis present

## 2022-05-13 DIAGNOSIS — J449 Chronic obstructive pulmonary disease, unspecified: Secondary | ICD-10-CM | POA: Diagnosis present

## 2022-05-13 DIAGNOSIS — Z885 Allergy status to narcotic agent status: Secondary | ICD-10-CM

## 2022-05-13 DIAGNOSIS — N179 Acute kidney failure, unspecified: Secondary | ICD-10-CM | POA: Diagnosis present

## 2022-05-13 DIAGNOSIS — F17200 Nicotine dependence, unspecified, uncomplicated: Secondary | ICD-10-CM | POA: Diagnosis present

## 2022-05-13 DIAGNOSIS — E162 Hypoglycemia, unspecified: Secondary | ICD-10-CM | POA: Diagnosis not present

## 2022-05-13 DIAGNOSIS — A0471 Enterocolitis due to Clostridium difficile, recurrent: Secondary | ICD-10-CM | POA: Diagnosis not present

## 2022-05-13 DIAGNOSIS — Z79899 Other long term (current) drug therapy: Secondary | ICD-10-CM

## 2022-05-13 DIAGNOSIS — F0394 Unspecified dementia, unspecified severity, with anxiety: Secondary | ICD-10-CM | POA: Diagnosis present

## 2022-05-13 DIAGNOSIS — G2581 Restless legs syndrome: Secondary | ICD-10-CM | POA: Diagnosis present

## 2022-05-13 DIAGNOSIS — K5780 Diverticulitis of intestine, part unspecified, with perforation and abscess without bleeding: Secondary | ICD-10-CM | POA: Diagnosis present

## 2022-05-13 DIAGNOSIS — I2510 Atherosclerotic heart disease of native coronary artery without angina pectoris: Secondary | ICD-10-CM | POA: Diagnosis present

## 2022-05-13 DIAGNOSIS — F418 Other specified anxiety disorders: Secondary | ICD-10-CM | POA: Diagnosis present

## 2022-05-13 DIAGNOSIS — E1065 Type 1 diabetes mellitus with hyperglycemia: Secondary | ICD-10-CM | POA: Diagnosis present

## 2022-05-13 DIAGNOSIS — I951 Orthostatic hypotension: Secondary | ICD-10-CM | POA: Diagnosis present

## 2022-05-13 DIAGNOSIS — F0390 Unspecified dementia without behavioral disturbance: Secondary | ICD-10-CM | POA: Diagnosis present

## 2022-05-13 DIAGNOSIS — Z794 Long term (current) use of insulin: Secondary | ICD-10-CM

## 2022-05-13 DIAGNOSIS — F0393 Unspecified dementia, unspecified severity, with mood disturbance: Secondary | ICD-10-CM | POA: Diagnosis present

## 2022-05-13 DIAGNOSIS — E739 Lactose intolerance, unspecified: Secondary | ICD-10-CM | POA: Diagnosis present

## 2022-05-13 DIAGNOSIS — E1165 Type 2 diabetes mellitus with hyperglycemia: Secondary | ICD-10-CM

## 2022-05-13 DIAGNOSIS — Z7982 Long term (current) use of aspirin: Secondary | ICD-10-CM

## 2022-05-13 DIAGNOSIS — I10 Essential (primary) hypertension: Secondary | ICD-10-CM | POA: Diagnosis present

## 2022-05-13 DIAGNOSIS — E875 Hyperkalemia: Secondary | ICD-10-CM | POA: Diagnosis present

## 2022-05-13 DIAGNOSIS — K8689 Other specified diseases of pancreas: Secondary | ICD-10-CM | POA: Diagnosis present

## 2022-05-13 DIAGNOSIS — Z9081 Acquired absence of spleen: Secondary | ICD-10-CM

## 2022-05-13 DIAGNOSIS — R636 Underweight: Secondary | ICD-10-CM | POA: Diagnosis present

## 2022-05-13 DIAGNOSIS — Z681 Body mass index (BMI) 19 or less, adult: Secondary | ICD-10-CM

## 2022-05-13 DIAGNOSIS — Z7984 Long term (current) use of oral hypoglycemic drugs: Secondary | ICD-10-CM

## 2022-05-13 DIAGNOSIS — I5032 Chronic diastolic (congestive) heart failure: Secondary | ICD-10-CM | POA: Diagnosis present

## 2022-05-13 DIAGNOSIS — Z933 Colostomy status: Secondary | ICD-10-CM

## 2022-05-13 DIAGNOSIS — F1490 Cocaine use, unspecified, uncomplicated: Secondary | ICD-10-CM | POA: Diagnosis present

## 2022-05-13 DIAGNOSIS — R4182 Altered mental status, unspecified: Secondary | ICD-10-CM | POA: Diagnosis not present

## 2022-05-13 DIAGNOSIS — E10649 Type 1 diabetes mellitus with hypoglycemia without coma: Secondary | ICD-10-CM | POA: Diagnosis not present

## 2022-05-13 LAB — URINALYSIS, ROUTINE W REFLEX MICROSCOPIC
Bilirubin Urine: NEGATIVE
Glucose, UA: NEGATIVE mg/dL
Ketones, ur: NEGATIVE mg/dL
Leukocytes,Ua: NEGATIVE
Nitrite: NEGATIVE
Protein, ur: NEGATIVE mg/dL
Specific Gravity, Urine: 1.011 (ref 1.005–1.030)
pH: 5 (ref 5.0–8.0)

## 2022-05-13 LAB — CBC WITH DIFFERENTIAL/PLATELET
Abs Immature Granulocytes: 0.05 10*3/uL (ref 0.00–0.07)
Basophils Absolute: 0 10*3/uL (ref 0.0–0.1)
Basophils Relative: 0 %
Eosinophils Absolute: 0 10*3/uL (ref 0.0–0.5)
Eosinophils Relative: 0 %
HCT: 38.6 % (ref 36.0–46.0)
Hemoglobin: 12.2 g/dL (ref 12.0–15.0)
Immature Granulocytes: 1 %
Lymphocytes Relative: 35 %
Lymphs Abs: 3.2 10*3/uL (ref 0.7–4.0)
MCH: 30 pg (ref 26.0–34.0)
MCHC: 31.6 g/dL (ref 30.0–36.0)
MCV: 94.8 fL (ref 80.0–100.0)
Monocytes Absolute: 0.5 10*3/uL (ref 0.1–1.0)
Monocytes Relative: 5 %
Neutro Abs: 5.4 10*3/uL (ref 1.7–7.7)
Neutrophils Relative %: 59 %
Platelets: 347 10*3/uL (ref 150–400)
RBC: 4.07 MIL/uL (ref 3.87–5.11)
RDW: 17 % — ABNORMAL HIGH (ref 11.5–15.5)
WBC: 9.2 10*3/uL (ref 4.0–10.5)
nRBC: 0 % (ref 0.0–0.2)

## 2022-05-13 LAB — BASIC METABOLIC PANEL
Anion gap: 14 (ref 5–15)
BUN: 33 mg/dL — ABNORMAL HIGH (ref 8–23)
CO2: 16 mmol/L — ABNORMAL LOW (ref 22–32)
Calcium: 8.7 mg/dL — ABNORMAL LOW (ref 8.9–10.3)
Chloride: 108 mmol/L (ref 98–111)
Creatinine, Ser: 1.38 mg/dL — ABNORMAL HIGH (ref 0.44–1.00)
GFR, Estimated: 43 mL/min — ABNORMAL LOW (ref >60)
Glucose, Bld: 105 mg/dL — ABNORMAL HIGH (ref 70–99)
Potassium: 5 mmol/L (ref 3.5–5.1)
Sodium: 138 mmol/L (ref 135–145)

## 2022-05-13 LAB — CBG MONITORING, ED
Glucose-Capillary: 133 mg/dL — ABNORMAL HIGH (ref 70–99)
Glucose-Capillary: 118 mg/dL — ABNORMAL HIGH (ref 70–99)
Glucose-Capillary: 93 mg/dL (ref 70–99)
Glucose-Capillary: 96 mg/dL (ref 70–99)

## 2022-05-13 MED ORDER — HEPARIN SODIUM (PORCINE) 5000 UNIT/ML IJ SOLN
5000.0000 [IU] | Freq: Two times a day (BID) | INTRAMUSCULAR | Status: DC
  Administered 2022-05-13 – 2022-05-17 (×8): 5000 [IU] via SUBCUTANEOUS
  Filled 2022-05-13 (×8): qty 1

## 2022-05-13 MED ORDER — ACETAMINOPHEN 325 MG PO TABS: 650.0000 mg | ORAL_TABLET | Freq: Four times a day (QID) | ORAL | Status: DC | PRN

## 2022-05-13 MED ORDER — INSULIN ASPART 100 UNIT/ML IJ SOLN: 0.0000 [IU] | Freq: Three times a day (TID) | INTRAMUSCULAR | Status: DC

## 2022-05-13 MED ORDER — SODIUM CHLORIDE 0.9 % IV SOLN: INTRAVENOUS | Status: DC

## 2022-05-13 MED ORDER — ACETAMINOPHEN 650 MG RE SUPP: 650.0000 mg | Freq: Four times a day (QID) | RECTAL | Status: DC | PRN

## 2022-05-13 MED ORDER — SODIUM CHLORIDE 0.9% FLUSH
3.0000 mL | Freq: Two times a day (BID) | INTRAVENOUS | Status: DC
  Administered 2022-05-14 – 2022-05-17 (×7): 3 mL via INTRAVENOUS

## 2022-05-13 NOTE — ED Notes (Signed)
Bair Hugger applied to pt.

## 2022-05-13 NOTE — Assessment & Plan Note (Signed)
Pt has h/o diverticulitis and abscess s/p colectomy and colostomy.

## 2022-05-13 NOTE — ED Notes (Signed)
Pt to ED from [place] Pt was found to be hypoglycemic by staff (pt was slumped over in couch, responsive to painful stimuli only)  CBG was 34 (EMS arrival), 281mL of D10 given IV, then CBG was 332  EMS VSS except 149/122. Hx colostomy, seizures, DM, pancreatitis  Pt is currently alert and oriented and looking around, sitting in wheelchair

## 2022-05-13 NOTE — Assessment & Plan Note (Signed)
History of cocaine abuse in the problem list. Will obtain a urine drug screen.

## 2022-05-13 NOTE — ED Provider Notes (Signed)
-----------------------------------------   6:30 PM on 05/13/2022 ----------------------------------------- Patient care assumed from Dr. Cherylann Banas.  Patient's temperature increasing currently 97.4 up from 93 degrees earlier patient continues to be under a Retail banker.  Patient CBC shows no significant findings, chemistry also reassuring.  Blood glucose although low initially has maintained in the 90s currently 96 on last check.  However given the patient's initial hypoglycemia weakness and hypothermia I believe the patient will likely require admission to the hospital service for further workup and monitoring.  In and out catheter performed for urine sample.   Harvest Dark, MD 05/13/22 [phone]

## 2022-05-13 NOTE — H&P (Signed)
History and Physical     [name] [id number] DOB: 12/30/1960 DOA: 05/13/2022 DOS: the patient was seen and examined on 05/13/2022 PCP: [name], NP   Patient coming from: Home.   Chief Complaint: Hypoglycemia  HISTORY OF PRESENT ILLNESS: DARLYS DEBRUYN is an 62 y.o. female seen today in the emergency room brought by EMS for severe hypoglycemia with blood sugars in the 30s.  Initially when seen by EMS patient was immediately given D10 and her sugars rose to 332.  Patient was not altered was alert awake oriented however was found to be hypothermic in the emergency room and will start started on Bair hugger's. Patient states that she does have low blood sugars .  In the emergency room patient was given a sandwich and patient requested another sandwich. Her family lives with her which includes her daughter and granddaughter.  Patient has a past medical history of heart disease seizure disorder hyperlipidemia diabetes mellitus type 2 COPD diastolic congestive heart failure, stroke.  Patient also has a history of diverticular abscess status post colostomy. Admission requested for monitoring blood sugars and observation for her hypoglycemia and hypothermia. Patient during my exam is alert awake oriented cooperative states that she tried to get up to use the bathroom and was dizzy.  Past Medical History:  Diagnosis Date   C. difficile colitis 02/17/2021   Colostomy in place [facility])    h/o diverticulitis with abscess   Complicated grief [id number]   COPD, mild ([place]) 09/16/2015   PFTs on 06/15/15 with FEV1/FVC ratio of 64%, FEV1 83%, DLCO 47%   Diabetes mellitus without complication (HCC)    Dyslipidemia    History of Clostridium difficile colitis 03/03/2021   Hyperosmolar hyperglycemic state (HHS) ([place]) 11/18/2020   Hypertension    Hypokalemia; hyperkalemia 07/12/2019   Lactic acidosis 11/19/2020   Lupus (HCC)    Pancreatic cancer (HCC)    RLS (restless legs syndrome)     Seizure disorder ([place])    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  Allergies  Allergen Reactions   Cephalosporins Itching    TOLERATED ZOSYN (PIPERACILLIN) BEFORE   Hydromorphone Itching   Keflin [Cephalothin] Itching   Lactose Intolerance (Gi) Diarrhea   Past Surgical History:  Procedure Laterality Date   COLECTOMY WITH COLOSTOMY CREATION/HARTMANN PROCEDURE N/A 03/10/2021   Procedure: COLECTOMY WITH COLOSTOMY CREATION/HARTMANN PROCEDURE;  Surgeon: Jules Husbands, MD;  Location: [facility] ORS;  Service: General;  Laterality: N/A;   PANCREATECTOMY     spleenectomy     MEDICATIONS: Prior to Admission medications   Medication Sig Start Date End Date Taking? Authorizing Provider  aspirin 81 MG EC tablet Take 1 tablet (81 mg total) by mouth daily. Swallow whole. 05/23/21   Pokhrel, Corrie Mckusick, MD  atorvastatin (LIPITOR) 40 MG tablet Take 1 tablet (40 mg total) by mouth Nightly. Patient taking differently: Take 40 mg by mouth at bedtime. 03/14/21 02/09/22  Fritzi Mandes, MD  buPROPion (WELLBUTRIN XL) 150 MG 24 hr tablet Take 150 mg by mouth daily.    [provider]  Dextrose, Diabetic Use, ([facility] GLUCOSE BITS) 1 g CHEW Chew 1 tablet by mouth daily as needed (hypoglycemia). 12/11/21   [provider]  divalproex (DEPAKOTE) 500 MG DR tablet Take 1 tablet (500 mg total) by mouth 2 (two) times daily. 11/09/21   Janine Ores, NP  famotidine (PEPCID) 20 MG tablet Take 1 tablet (  20 mg total) by mouth daily. 08/25/21   Fritzi Mandes, MD  feeding supplement, GLUCERNA SHAKE, (GLUCERNA SHAKE) LIQD Take 237 mLs by mouth 3 (three) times daily between meals. 08/24/21   Fritzi Mandes, MD  fludrocortisone (FLORINEF) 0.1 MG tablet Take 0.5 tablets (0.05 mg total) by mouth daily. 02/23/22   Emeterio Reeve, DO  furosemide (LASIX) 20 MG tablet Take 1 tablet (20 mg total) by mouth daily. 02/24/22   Emeterio Reeve, DO   insulin aspart (NOVOLOG) 100 UNIT/ML injection Inject 3 Units into the skin 3 (three) times daily with meals. Hold for pre-meal blood sugar less than 120 02/23/22   Emeterio Reeve, DO  insulin glargine-yfgn (SEMGLEE) 100 UNIT/ML injection Inject 0.11 mLs (11 Units total) into the skin 2 (two) times daily. 02/23/22   Emeterio Reeve, DO  loratadine (CLARITIN) 10 MG tablet Take 10 mg by mouth daily as needed for allergies.    [provider]  melatonin 5 MG TABS Take 0.5 tablets (2.5 mg total) by mouth at bedtime. 10/16/21   Shelly Coss, MD  metFORMIN (GLUCOPHAGE) 1000 MG tablet Take 1,000 mg by mouth 2 (two) times daily. 12/04/21   [provider]  mirtazapine (REMERON) 15 MG tablet Take 15 mg by mouth at bedtime.    [provider]  Multiple Vitamin (MULTIVITAMIN WITH MINERALS) TABS tablet Take 1 tablet by mouth daily. 08/25/21   Fritzi Mandes, MD  nicotine (NICODERM CQ - DOSED IN MG/24 HOURS) 21 mg/24hr patch Place 1 patch (21 mg total) onto the skin daily as needed (nicotine craving). 12/30/21   Annita Brod, MD  QUEtiapine (SEROQUEL XR) 50 MG TB24 24 hr tablet Take 50 mg by mouth at bedtime. 12/04/21   [provider]  rOPINIRole (REQUIP) 0.5 MG tablet Take 0.5 mg by mouth at bedtime. 06/29/21   [provider]  sodium bicarbonate 650 MG tablet Take 2 tablets (1,300 mg total) by mouth 2 (two) times daily. 02/23/22   Emeterio Reeve, DO  sodium zirconium cyclosilicate (LOKELMA) 10 g PACK packet Take 10 g by mouth daily. 02/23/22   Emeterio Reeve, DO  zinc sulfate 220 (50 Zn) MG capsule Take 1 capsule (220 mg total) by mouth daily. 10/17/21   Shelly Coss, MD   ED Course: Pt in Ed Is A/O and hypothermia is improved as is mentation.  Vitals:   05/13/22 1730 05/13/22 1808 05/13/22 2135 05/13/22 2209  BP: (!) 140/113  135/63   Pulse: 81  93   Resp: 16  17   Temp:  (!) 97.4 F (36.3 C)  (!) 97.3 F (36.3 C)  TempSrc:  Oral  Oral   SpO2: 99%  98%   Weight:       No intake/output data recorded. SpO2: 98 % Blood work in ed shows: BMP shows glucose of 105 , AKI of 1.38 . CBC is wnl.   Results for orders placed or performed during the hospital encounter of 05/13/22 (from the past 72 hour(s))  CBG monitoring, ED     Status: Abnormal   Collection Time: 05/13/22  1:02 PM  Result Value Ref Range   Glucose-Capillary 133 (H) 70 - 99 mg/dL    Comment: Glucose reference range applies only to samples taken after fasting for at least 8 hours.  Urinalysis, Routine w reflex microscopic -Urine, Clean Catch     Status: Abnormal   Collection Time: 05/13/22  1:28 PM  Result Value Ref Range   Color, Urine YELLOW (A) YELLOW   APPearance  HAZY (A) CLEAR   Specific Gravity, Urine 1.011 1.005 - 1.030   pH 5.0 5.0 - 8.0   Glucose, UA NEGATIVE NEGATIVE mg/dL   Hgb urine dipstick MODERATE (A) NEGATIVE   Bilirubin Urine NEGATIVE NEGATIVE   Ketones, ur NEGATIVE NEGATIVE mg/dL   Protein, ur NEGATIVE NEGATIVE mg/dL   Nitrite NEGATIVE NEGATIVE   Leukocytes,Ua NEGATIVE NEGATIVE   WBC, UA 0-5 0 - 5 WBC/hpf   Bacteria, UA RARE (A) NONE SEEN   Squamous Epithelial / HPF 0-5 0 - 5 /HPF   Hyaline Casts, UA PRESENT     Comment: Performed at [facility], [address] [place], [place] [address]  CBC with Differential     Status: Abnormal   Collection Time: 05/13/22  2:15 PM  Result Value Ref Range   WBC 9.2 4.0 - 10.5 K/uL   RBC 4.07 3.87 - 5.11 MIL/uL   Hemoglobin 12.2 12.0 - 15.0 g/dL   HCT 38.6 36.0 - 46.0 %   MCV 94.8 80.0 - 100.0 fL   MCH 30.0 26.0 - 34.0 pg   MCHC 31.6 30.0 - 36.0 g/dL   RDW 17.0 (H) 11.5 - 15.5 %   Platelets 347 150 - 400 K/uL   nRBC 0.0 0.0 - 0.2 %   Neutrophils Relative % 59 %   Neutro Abs 5.4 1.7 - 7.7 K/uL   Lymphocytes Relative 35 %   Lymphs Abs 3.2 0.7 - 4.0 K/uL   Monocytes Relative 5 %   Monocytes Absolute 0.5 0.1 - 1.0 K/uL   Eosinophils Relative 0 %   Eosinophils Absolute 0.0 0.0 -  0.5 K/uL   Basophils Relative 0 %   Basophils Absolute 0.0 0.0 - 0.1 K/uL   Immature Granulocytes 1 %   Abs Immature Granulocytes 0.05 0.00 - 0.07 K/uL    Comment: Performed at [facility], [address] [place], [place] [address]  CBG monitoring, ED     Status: Abnormal   Collection Time: 05/13/22  2:31 PM  Result Value Ref Range   Glucose-Capillary 118 (H) 70 - 99 mg/dL    Comment: Glucose reference range applies only to samples taken after fasting for at least 8 hours.  Basic metabolic panel     Status: Abnormal   Collection Time: 05/13/22  4:10 PM  Result Value Ref Range   Sodium 138 135 - 145 mmol/L   Potassium 5.0 3.5 - 5.1 mmol/L   Chloride 108 98 - 111 mmol/L   CO2 16 (L) 22 - 32 mmol/L   Glucose, Bld 105 (H) 70 - 99 mg/dL    Comment: Glucose reference range applies only to samples taken after fasting for at least 8 hours.   BUN 33 (H) 8 - 23 mg/dL   Creatinine, Ser 1.38 (H) 0.44 - 1.00 mg/dL   Calcium 8.7 (L) 8.9 - 10.3 mg/dL   GFR, Estimated 43 (L) >60 mL/min    Comment: (NOTE) Calculated using the CKD-EPI Creatinine Equation (2021)    Anion gap 14 5 - 15    Comment: Performed at [facility], [address] [place], [place] [address]  CBG monitoring, ED     Status: None   Collection Time: 05/13/22  4:25 PM  Result Value Ref Range   Glucose-Capillary 93 70 - 99 mg/dL    Comment: Glucose reference range applies only to samples taken after fasting for at least 8 hours.  CBG monitoring, ED     Status: None   Collection  Time: 05/13/22  5:45 PM  Result Value Ref Range   Glucose-Capillary 96 70 - 99 mg/dL    Comment: Glucose reference range applies only to samples taken after fasting for at least 8 hours.    Lab Results  Component Value Date   CREATININE 1.38 (H) 05/13/2022   CREATININE 0.89 02/26/2022   CREATININE 0.83 02/24/2022      Latest Ref Rng & Units 05/13/2022    4:10 PM 02/26/2022    5:56 AM 02/24/2022    7:21 AM  CMP  Glucose 70  - 99 mg/dL 105  284  119   BUN 8 - 23 mg/dL 33  37  30   Creatinine 0.44 - 1.00 mg/dL 1.38  0.89  0.83   Sodium 135 - 145 mmol/L 138  137  143   Potassium 3.5 - 5.1 mmol/L 5.0  4.4  4.2   Chloride 98 - 111 mmol/L 108  103  111   CO2 22 - 32 mmol/L 16  24  26    Calcium 8.9 - 10.3 mg/dL 8.7  8.2  8.6    Unresulted Labs (From admission, onward)     Start     Ordered   05/14/22 0500  Comprehensive metabolic panel  Tomorrow morning,   STAT        05/13/22 2203   05/14/22 0500  CBC  Tomorrow morning,   STAT        05/13/22 2203   05/13/22 2233  Urine Drug Screen, Qualitative ([place] only)  Once,   R        05/13/22 2232   05/13/22 2155  HIV Antibody (routine testing w rflx)  (HIV Antibody (Routine testing w reflex) panel)  Once,   URGENT        05/13/22 2203           Pt has received : Orders Placed This Encounter  Procedures   Critical Care    This order was created via procedure documentation    Standing Status:   Standing    Number of Occurrences:   1   CT HIP RIGHT WO CONTRAST    Standing Status:   Standing    Number of Occurrences:   1   CT HEAD WO CONTRAST (5MM)    Standing Status:   Standing    Number of Occurrences:   1   CBC with Differential    Standing Status:   Standing    Number of Occurrences:   1   Urinalysis, Routine w reflex microscopic -Urine, Clean Catch    Standing Status:   Standing    Number of Occurrences:   1    Order Specific Question:   Specimen Source    Answer:   Urine, Clean Catch [id number]   Basic metabolic panel    Standing Status:   Standing    Number of Occurrences:   1   HIV Antibody (routine testing w rflx)    Standing Status:   Standing    Number of Occurrences:   1   Comprehensive metabolic panel    Standing Status:   Standing    Number of Occurrences:   1   CBC    Standing Status:   Standing    Number of Occurrences:   1   Urine Drug Screen, Qualitative ([facility] only)    Standing Status:   Standing    Number of Occurrences:   1    Diet Carb Modified Fluid consistency: Thin; Room  service appropriate? Yes    Standing Status:   Standing    Number of Occurrences:   1    Order Specific Question:   Diet-HS Snack?    Answer:   Nothing    Order Specific Question:   Calorie Level    Answer:   Medium 1600-2000    Order Specific Question:   Fluid consistency:    Answer:   Thin    Order Specific Question:   Room service appropriate?    Answer:   Yes   Check Rectal Temperature    Standing Status:   Standing    Number of Occurrences:   1   Apply warming blanket (Bair Hugger)    Standing Status:   Standing    Number of Occurrences:   1   Orthostatic vital signs    Standing Status:   Standing    Number of Occurrences:   2   Apply Diabetes Mellitus Care Plan    Standing Status:   Standing    Number of Occurrences:   1   STAT CBG when hypoglycemia is suspected. If treated, recheck every 15 minutes after each treatment until CBG >/= 70 mg/dl    Standing Status:   Standing    Number of Occurrences:   1   Refer to Hypoglycemia Protocol Sidebar Report for treatment of CBG < 70 mg/dl    Standing Status:   Standing    Number of Occurrences:   1   No HS correction Insulin    Standing Status:   Standing    Number of Occurrences:   1   Maintain IV access    Standing Status:   Standing    Number of Occurrences:   1   Vital signs    Standing Status:   Standing    Number of Occurrences:   1   Notify physician (specify)    Standing Status:   Standing    Number of Occurrences:   20    Order Specific Question:   Notify Physician    Answer:   for pulse less than 55 or greater than 120    Order Specific Question:   Notify Physician    Answer:   for respiratory rate less than 12 or greater than 25    Order Specific Question:   Notify Physician    Answer:   for temperature greater than 100.5 F    Order Specific Question:   Notify Physician    Answer:   for urinary output less than 30 mL/hr for four hours    Order Specific  Question:   Notify Physician    Answer:   for systolic BP less than 90 or greater than 0000000, diastolic BP less than 60 or greater than 100    Order Specific Question:   Notify Physician    Answer:   for new hypoxia w/ oxygen saturations < 88%   Daily weights    Standing Status:   Standing    Number of Occurrences:   1   Intake and Output    Standing Status:   Standing    Number of Occurrences:   1   Initiate Oral Care Protocol    Standing Status:   Standing    Number of Occurrences:   1   Initiate Carrier Fluid Protocol    Standing Status:   Standing    Number of Occurrences:   1   RN may order General Admission PRN Orders utilizing "General  Admission PRN medications" (through manage orders) for the following patient needs: allergy symptoms (Claritin), cold sores (Carmex), cough (Robitussin DM), eye irritation (Liquifilm Tears), hemorrhoids (Tucks), indigestion (Maalox), minor skin irritation (Hydrocortisone Cream), muscle pain Suezanne Jacquet Gay), nose irritation (saline nasal spray) and sore throat (Chloraseptic spray).    Standing Status:   Standing    Number of Occurrences:   [id number]   Bed rest    Standing Status:   Standing    Number of Occurrences:   1   Orthostatic vital signs    Standing Status:   Standing    Number of Occurrences:   1   Full code    Standing Status:   Standing    Number of Occurrences:   1    Order Specific Question:   By:    Answer:   Other   Consult to hospitalist    Standing Status:   Standing    Number of Occurrences:   1    Order Specific Question:   Place call to:    Answer:   [facility]    Order Specific Question:   Reason for Consult    Answer:   Admit    Order Specific Question:   Diagnosis/Clinical Info for Consult:    Answer:   hypoglycemia, hypothermia   PT eval and treat    Standing Status:   Standing    Number of Occurrences:   1   Pulse oximetry check with vital signs    Standing Status:   Standing    Number of Occurrences:   1   Oxygen therapy Mode  or (Route): Nasal cannula; Liters Per Minute: 2; Keep 02 saturation: greater than 92 %    Standing Status:   Standing    Number of Occurrences:   20    Order Specific Question:   Mode or (Route)    Answer:   Nasal cannula    Order Specific Question:   Liters Per Minute    Answer:   2    Order Specific Question:   Keep 02 saturation    Answer:   greater than 92 %   CBG monitoring, ED    Standing Status:   Standing    Number of Occurrences:   1   CBG monitoring, ED    Standing Status:   Standing    Number of Occurrences:   4   Place in observation (patient's expected length of stay will be less than 2 midnights)    Standing Status:   Standing    Number of Occurrences:   1    Order Specific Question:   Hospital Area    Answer:   [place] [[id number]]    Order Specific Question:   Level of Care    Answer:   Med-Surg [16]    Order Specific Question:   Covid Evaluation    Answer:   Asymptomatic - no recent exposure (last 10 days) testing not required    Order Specific Question:   Diagnosis    Answer:   Hypoglycemia [id number]    Order Specific Question:   Admitting Physician    Answer:   Cherylann Ratel    Order Specific Question:   Attending Physician    Answer:   Cherylann Ratel   Seizure precautions    Standing Status:   Standing    Number of Occurrences:   1   Aspiration precautions    Standing Status:   Standing  Number of Occurrences:   1   Fall precautions    Standing Status:   Standing    Number of Occurrences:   1    Meds ordered this encounter  Medications   heparin injection 5,000 Units   sodium chloride flush (NS) 0.9 % injection 3 mL   0.9 %  sodium chloride infusion   OR Linked Order Group    acetaminophen (TYLENOL) tablet 650 mg    acetaminophen (TYLENOL) suppository 650 mg   insulin aspart (novoLOG) injection 0-15 Units    Order Specific Question:   Correction coverage:    Answer:   Moderate (average weight, post-op)     Order Specific Question:   CBG < 70:    Answer:   implement hypoglycemia protocol    Order Specific Question:   CBG 70 - 120:    Answer:   0 units    Order Specific Question:   CBG 121 - 150:    Answer:   2 units    Order Specific Question:   CBG 151 - 200:    Answer:   3 units    Order Specific Question:   CBG 201 - 250:    Answer:   5 units    Order Specific Question:   CBG 251 - 300:    Answer:   8 units    Order Specific Question:   CBG 301 - 350:    Answer:   11 units    Order Specific Question:   CBG 351 - 400:    Answer:   15 units    Order Specific Question:   CBG > 400    Answer:   call MD and obtain STAT lab verification    Admission Imaging : CT HIP RIGHT WO CONTRAST  Result Date: 05/13/2022 CLINICAL DATA:  Fall and right hip pain. EXAM: CT OF THE RIGHT HIP WITHOUT CONTRAST TECHNIQUE: Multidetector CT imaging of the right hip was performed according to the standard protocol. Multiplanar CT image reconstructions were also generated. RADIATION DOSE REDUCTION: This exam was performed according to the departmental dose-optimization program which includes automated exposure control, adjustment of the mA and/or kV according to patient size and/or use of iterative reconstruction technique. COMPARISON:  None Available. FINDINGS: Bones/Joint/Cartilage There is no acute fracture or dislocation. The bones are osteopenic. No joint effusion Ligaments Suboptimally assessed by CT. Muscles and Tendons No acute finding.  No intramuscular fluid collection. Soft tissues Postsurgical changes of the bowel with anastomotic suture in the pelvis. IMPRESSION: No acute fracture or dislocation. Electronically Signed   By: Anner Crete M.D.   On: 05/13/2022 22:29   Physical Examination: Vitals:   05/13/22 1730 05/13/22 1808 05/13/22 2135 05/13/22 2209  BP: (!) 140/113  135/63   Pulse: 81  93   Temp:  (!) 97.4 F (36.3 C)  (!) 97.3 F (36.3 C)  Resp: 16  17   Weight:      SpO2: 99%  98%    TempSrc:  Oral  Oral   Physical Exam Vitals and nursing note reviewed.  Constitutional:      General: She is not in acute distress.    Appearance: Normal appearance. She is not ill-appearing, toxic-appearing or diaphoretic.  HENT:     Head: Normocephalic and atraumatic.     Right Ear: Hearing and external ear normal.     Left Ear: Hearing and external ear normal.     Nose: Nose normal. No nasal deformity.  Mouth/Throat:     Lips: Pink.     Mouth: Mucous membranes are moist.     Tongue: No lesions.     Pharynx: Oropharynx is clear.  Eyes:     Extraocular Movements: Extraocular movements intact.     Pupils: Pupils are equal, round, and reactive to light.  Cardiovascular:     Rate and Rhythm: Normal rate and regular rhythm.     Pulses: Normal pulses.     Heart sounds: Normal heart sounds.  Pulmonary:     Effort: Pulmonary effort is normal.     Breath sounds: Normal breath sounds.  Abdominal:     General: Bowel sounds are normal. There is no distension.     Palpations: Abdomen is soft. There is no mass.     Tenderness: There is no abdominal tenderness. There is no guarding.     Hernia: No hernia is present.  Musculoskeletal:     Right lower leg: No edema.     Left lower leg: No edema.  Skin:    General: Skin is warm.  Neurological:     General: No focal deficit present.     Mental Status: She is alert and oriented to person, place, and time.     Cranial Nerves: Cranial nerves 2-12 are intact.     Motor: Motor function is intact.  Psychiatric:        Attention and Perception: Attention normal.        Mood and Affect: Mood normal.        Speech: Speech normal.        Behavior: Behavior normal. Behavior is cooperative.        Cognition and Memory: Cognition normal.     Assessment and Plan: * Hypoglycemia Consistent carb diet. A1c. Glycemic protocol.   Seizure ([place]) Continue Depakote, will get a Depakote level. Patient is currently on 500 mg twice  daily. Aspiration and seizure precautions.   Uncontrolled type 2 diabetes mellitus with hyperglycemia, with long-term current use of insulin (HCC) Consistent carb diet. Endo refrral as outpatient. glycemic protocol.    AKI (acute kidney injury) [facility]) Lab Results  Component Value Date   CREATININE 1.38 (H) 05/13/2022   CREATININE 0.89 02/26/2022   CREATININE 0.83 02/24/2022  AKI with a creatinine of 1.38. Attribute to dehydration and hypovolemia. Continue MIVF.    Hypothermia Not sure if pt became hypothermic because she was lethargic from hypoglycemia and then got worse.  We will monitor accucheck.s   Chronic diastolic CHF (congestive heart failure) ([place]) Currently stable and compensated. Will hold any diuretic therapy as blood pressure is low. Will continue patient on aspirin and atorvastatin.  Depression with anxiety Continue patient on bupropion and mirtazapine. Will cut down dose of Wellbutrin to half. Will defer to primary care physician to discontinue Wellbutrin as patient has a history of seizure disorder.  COPD (chronic obstructive pulmonary disease) ([place]) Stable we will continue with pulse oximetry with vital checks. As needed albuterol.  Tobacco dependence Nicotine patch.   HTN (hypertension) Vitals:   05/13/22 1310 05/13/22 1400 05/13/22 1430 05/13/22 1500  BP: 96/74 101/85 (!) 121/93 (!) 117/92   05/13/22 1530 05/13/22 1730 05/13/22 2135  BP: 101/83 (!) 140/113 135/63  Suspect hypotension. We will also get orthostatic vitals.  Hold BP meds.     Dementia without behavioral disturbance ([place]) Patient is currently on Seroquel, Remeron, Depakote.  Patient is also on Wellbutrin.  Acute ischemic stroke [facility]) Admit patient to med telemetry unit with continuous  cardiac monitoring. So obtain an MRI of the brain. Continue with aspirin 81.   Diverticulitis of intestine with abscess Pt has h/o diverticulitis and abscess s/p colectomy and colostomy.    Cocaine use History of cocaine abuse in the problem list. Will obtain a urine drug screen.    DVT prophylaxis:  Heparin.  Code Status:  Full code.     02/09/2022    1:20 PM  Advanced Directives  Does Patient Have a Medical Advance Directive? No    Family Communication:  None.  Emergency Contact: Contact Information     Name Relation Home Work Mobile   Allen-Bird (DSS),Sentrell Legal Guardian  [phone] [place]  [phone]   Wille Glaser [phone] Other   [phone]   Mckeough,Ericka Daughter [phone]  [phone]   [facility] Mother   [phone]   Edison Simon Sister   [phone]   Thornell Mule Sister   [phone]   Caroline More ([place]) Other  [phone]       Disposition Plan:  Home.   Consults: None. Admission status: Observation.    Unit / Expected LOS: Med surg/ 1 days.    Para Skeans MD [facility]  6 PM- 2 AM. [phone]( Pager [phone] Please use [email or web address] to contact the current [facility] MD  based on hours  and team and services or  You may call [phone] to contact current Assigned [facility] Attending/Consulting MD for this patient.  This number is the [facility] ADMIT/Consult system  and will be able to assist any patient in any [place] location .

## 2022-05-13 NOTE — Assessment & Plan Note (Signed)
Consistent carb diet. A1c. Glycemic protocol.

## 2022-05-13 NOTE — Assessment & Plan Note (Signed)
Consistent carb diet. Endo refrral as outpatient. glycemic protocol.

## 2022-05-13 NOTE — ED Provider Notes (Signed)
[facility] Provider Note    Event Date/Time   First MD Initiated Contact with Patient 05/13/22 1323     (approximate)   History   Hypoglycemia   HPI  Brittany [name] is a 62 y.o. female with a history of CAD, seizure order, hypertension, hyperlipidemia, diabetes, COPD, diastolic CHF, stroke, and diverticular abscess status post colostomy who presents with hypoglycemia.  Per EMS the patient's sugar was being checked at her facility and was noted to be in the 30s.  The patient did not demonstrate any altered mental status at that time.  She states she was feeling slightly weak but denies other acute symptoms.  I reviewed the past medical records.  The patient was admitted in January after presenting with hypoglycemia and seizures.  She was also treated for refractory hyperkalemia.   Physical Exam   Triage Vital Signs: ED Triage Vitals  Enc Vitals Group     BP 05/13/22 1310 96/74     Pulse Rate 05/13/22 1310 84     Resp 05/13/22 1310 16     Temp 05/13/22 1312 (!) 93.5 F (34.2 C)     Temp Source 05/13/22 1312 Rectal     SpO2 05/13/22 1310 99 %     Weight 05/13/22 1311 90 lb (40.8 kg)     Height --      Head Circumference --      Peak Flow --      Pain Score 05/13/22 1311 0     Pain Loc --      Pain Edu? --      Excl. in [place]? --     Most recent vital signs: Vitals:   05/13/22 1430 05/13/22 1500  BP: (!) 121/93 (!) 117/92  Pulse: 73 72  Resp: 15 14  Temp:    SpO2: 100% 100%     General: Alert, oriented x 2, no distress.  CV:  Good peripheral perfusion.  Resp:  Normal effort.  Abd:  No distention.  Other:  Slightly dry mucous membranes.  Motor intact in all extremities.   ED Results / Procedures / Treatments   Labs (all labs ordered are listed, but only abnormal results are displayed) Labs Reviewed  CBC WITH DIFFERENTIAL/PLATELET - Abnormal; Notable for the following components:      Result Value   RDW 17.0 (*)    All other  components within normal limits  CBG MONITORING, ED - Abnormal; Notable for the following components:   Glucose-Capillary 133 (*)    All other components within normal limits  CBG MONITORING, ED - Abnormal; Notable for the following components:   Glucose-Capillary 118 (*)    All other components within normal limits  URINALYSIS, ROUTINE W REFLEX MICROSCOPIC  BASIC METABOLIC PANEL  CBG MONITORING, ED  CBG MONITORING, ED  CBG MONITORING, ED     EKG    RADIOLOGY    PROCEDURES:  Critical Care performed: Yes, see critical care procedure note(s)  .Critical Care  Performed by: Arta Silence, MD Authorized by: Arta Silence, MD   Critical care provider statement:    Critical care time (minutes):  30   Critical care time was exclusive of:  Separately billable procedures and treating other patients   Critical care was necessary to treat or prevent imminent or life-threatening deterioration of the following conditions:  Metabolic crisis   Critical care was time spent personally by me on the following activities:  Development of treatment plan with patient or surrogate, discussions  with consultants, evaluation of patient's response to treatment, examination of patient, ordering and review of laboratory studies, ordering and review of radiographic studies, ordering and performing treatments and interventions, pulse oximetry, re-evaluation of patient's condition, review of old charts and obtaining history from patient or surrogate    MEDICATIONS ORDERED IN ED: Medications - No data to display   IMPRESSION / MDM / [facility] / ED COURSE  I reviewed the triage vital signs and the nursing notes.  62 year old female with PMH as noted above presents after she was found to be hypoglycemic today although the patient was not altered or unresponsive at that time.  On ED arrival she is also found to be hypothermic although her other vital signs are normal.  The patient is  alert and neurologic exam is nonfocal.  She is on both long and short acting insulin.  Differential diagnosis includes, but is not limited to, type 2 diabetes, insulin use, dehydration, other metabolic disturbance, acute infection.  The patient has a history of labile sugars on her most recent admission.  We will obtain lab workup and serial fingersticks.  We will also place the patient on a Bair hugger.  I anticipate that she will need admission to watch her glucose and due to the hypothermia.  Patient's presentation is most consistent with acute presentation with potential threat to life or bodily function.  The patient is on the cardiac monitor to evaluate for evidence of arrhythmia and/or significant heart rate changes.  ----------------------------------------- 3:22 PM on 05/13/2022 -----------------------------------------  Repeat fingerstick is 118.  The patient remains alert.  Labs are pending.  I have signed her out to the oncoming ED physician Dr. Kerman Passey.    FINAL CLINICAL IMPRESSION(S) / ED DIAGNOSES   Final diagnoses:  Hypoglycemia  Hypothermia, initial encounter     Rx / DC Orders   ED Discharge Orders     None        Note:  This document was prepared using Dragon voice recognition software and may include unintentional dictation errors.    Arta Silence, MD 05/13/22 [phone]

## 2022-05-13 NOTE — Assessment & Plan Note (Signed)
No signs of heart failure.  Watch closely with IV fluids.

## 2022-05-13 NOTE — Assessment & Plan Note (Signed)
-  Nicotine patch 

## 2022-05-13 NOTE — Assessment & Plan Note (Signed)
Continue patient on bupropion and mirtazapine. Will cut down dose of Wellbutrin to half. Will defer to primary care physician to discontinue Wellbutrin as patient has a history of seizure disorder.

## 2022-05-13 NOTE — Assessment & Plan Note (Addendum)
On Depakote 

## 2022-05-13 NOTE — ED Notes (Signed)
This RN took over this patients assignment at 2305, this RN went in to the room to introduce myself and pt was sitting on the toilet and pulled off her colostomy bag and feces was on the floor and on pt's clothing. This RN cleaned the pt, got her in to the bed and put the bed alarm on. Pt is confused and also appears to be hallucinating as she kept telling this RN to grab a bag on the floor that did not exist. Pt has no more colostomy bag as she threw it away. This RN currently has a depends over the ostomy and will notify charge RN.

## 2022-05-13 NOTE — Assessment & Plan Note (Signed)
Lab Results  Component Value Date   CREATININE 1.38 (H) 05/13/2022   CREATININE 0.89 02/26/2022   CREATININE 0.83 02/24/2022  AKI with a creatinine of 1.38. Attribute to dehydration and hypovolemia. Continue MIVF.

## 2022-05-13 NOTE — ED Notes (Signed)
This RN obtained a colostomy bag and placed it on patient at this time.

## 2022-05-13 NOTE — Assessment & Plan Note (Signed)
Not sure if pt became hypothermic because she was lethargic from hypoglycemia and then got worse.  We will monitor accucheck.s

## 2022-05-13 NOTE — Assessment & Plan Note (Signed)
Stable we will continue with pulse oximetry with vital checks. As needed albuterol.

## 2022-05-13 NOTE — ED Triage Notes (Signed)
Per EMS, pt BGL was low and they did give pt glucose while en route to the ED from [place]. EMS sts that pt glucose was 333 post admin of the glucose. PT glucose in triage is 133.

## 2022-05-13 NOTE — Assessment & Plan Note (Signed)
Patient is currently on Seroquel, Remeron, Depakote.  Patient is also on Wellbutrin.

## 2022-05-14 ENCOUNTER — Other Ambulatory Visit: Payer: Self-pay

## 2022-05-14 ENCOUNTER — Encounter: Payer: Self-pay | Admitting: Internal Medicine

## 2022-05-14 ENCOUNTER — Other Ambulatory Visit (HOSPITAL_COMMUNITY): Payer: Self-pay

## 2022-05-14 DIAGNOSIS — Z933 Colostomy status: Secondary | ICD-10-CM | POA: Diagnosis not present

## 2022-05-14 DIAGNOSIS — R4182 Altered mental status, unspecified: Secondary | ICD-10-CM | POA: Diagnosis present

## 2022-05-14 DIAGNOSIS — G2581 Restless legs syndrome: Secondary | ICD-10-CM | POA: Diagnosis present

## 2022-05-14 DIAGNOSIS — I951 Orthostatic hypotension: Secondary | ICD-10-CM | POA: Diagnosis present

## 2022-05-14 DIAGNOSIS — Z794 Long term (current) use of insulin: Secondary | ICD-10-CM | POA: Diagnosis not present

## 2022-05-14 DIAGNOSIS — E109 Type 1 diabetes mellitus without complications: Secondary | ICD-10-CM

## 2022-05-14 DIAGNOSIS — E1065 Type 1 diabetes mellitus with hyperglycemia: Secondary | ICD-10-CM | POA: Diagnosis present

## 2022-05-14 DIAGNOSIS — I5032 Chronic diastolic (congestive) heart failure: Secondary | ICD-10-CM

## 2022-05-14 DIAGNOSIS — J449 Chronic obstructive pulmonary disease, unspecified: Secondary | ICD-10-CM

## 2022-05-14 DIAGNOSIS — I2510 Atherosclerotic heart disease of native coronary artery without angina pectoris: Secondary | ICD-10-CM | POA: Diagnosis present

## 2022-05-14 DIAGNOSIS — F418 Other specified anxiety disorders: Secondary | ICD-10-CM | POA: Diagnosis present

## 2022-05-14 DIAGNOSIS — K8689 Other specified diseases of pancreas: Secondary | ICD-10-CM

## 2022-05-14 DIAGNOSIS — A0471 Enterocolitis due to Clostridium difficile, recurrent: Secondary | ICD-10-CM | POA: Diagnosis present

## 2022-05-14 DIAGNOSIS — N179 Acute kidney failure, unspecified: Secondary | ICD-10-CM

## 2022-05-14 DIAGNOSIS — E10649 Type 1 diabetes mellitus with hypoglycemia without coma: Secondary | ICD-10-CM | POA: Diagnosis not present

## 2022-05-14 DIAGNOSIS — Z681 Body mass index (BMI) 19 or less, adult: Secondary | ICD-10-CM | POA: Diagnosis not present

## 2022-05-14 DIAGNOSIS — Z7984 Long term (current) use of oral hypoglycemic drugs: Secondary | ICD-10-CM | POA: Diagnosis not present

## 2022-05-14 DIAGNOSIS — K869 Disease of pancreas, unspecified: Secondary | ICD-10-CM | POA: Diagnosis present

## 2022-05-14 DIAGNOSIS — E871 Hypo-osmolality and hyponatremia: Secondary | ICD-10-CM

## 2022-05-14 DIAGNOSIS — F0394 Unspecified dementia, unspecified severity, with anxiety: Secondary | ICD-10-CM | POA: Diagnosis present

## 2022-05-14 DIAGNOSIS — Z9081 Acquired absence of spleen: Secondary | ICD-10-CM | POA: Diagnosis not present

## 2022-05-14 DIAGNOSIS — E875 Hyperkalemia: Secondary | ICD-10-CM

## 2022-05-14 DIAGNOSIS — F0393 Unspecified dementia, unspecified severity, with mood disturbance: Secondary | ICD-10-CM | POA: Diagnosis present

## 2022-05-14 DIAGNOSIS — E785 Hyperlipidemia, unspecified: Secondary | ICD-10-CM | POA: Diagnosis present

## 2022-05-14 DIAGNOSIS — T68XXXS Hypothermia, sequela: Secondary | ICD-10-CM

## 2022-05-14 DIAGNOSIS — G40909 Epilepsy, unspecified, not intractable, without status epilepticus: Secondary | ICD-10-CM | POA: Diagnosis present

## 2022-05-14 DIAGNOSIS — F0390 Unspecified dementia without behavioral disturbance: Secondary | ICD-10-CM

## 2022-05-14 DIAGNOSIS — R569 Unspecified convulsions: Secondary | ICD-10-CM

## 2022-05-14 DIAGNOSIS — R636 Underweight: Secondary | ICD-10-CM | POA: Diagnosis present

## 2022-05-14 DIAGNOSIS — I110 Hypertensive heart disease with heart failure: Secondary | ICD-10-CM | POA: Diagnosis present

## 2022-05-14 LAB — GLUCOSE, CAPILLARY
Glucose-Capillary: 122 mg/dL — ABNORMAL HIGH (ref 70–99)
Glucose-Capillary: 134 mg/dL — ABNORMAL HIGH (ref 70–99)
Glucose-Capillary: 131 mg/dL — ABNORMAL HIGH (ref 70–99)

## 2022-05-14 LAB — CBG MONITORING, ED
Glucose-Capillary: 426 mg/dL — ABNORMAL HIGH (ref 70–99)
Glucose-Capillary: 192 mg/dL — ABNORMAL HIGH (ref 70–99)

## 2022-05-14 LAB — C DIFFICILE QUICK SCREEN W PCR REFLEX
C Diff antigen: POSITIVE — AB
C Diff toxin: NEGATIVE

## 2022-05-14 LAB — GASTROINTESTINAL PANEL BY PCR, STOOL (REPLACES STOOL CULTURE)

## 2022-05-14 LAB — CLOSTRIDIUM DIFFICILE BY PCR, REFLEXED: Toxigenic C. Difficile by PCR: POSITIVE — AB

## 2022-05-14 LAB — COMPREHENSIVE METABOLIC PANEL
ALT: 32 U/L (ref 0–44)
AST: 49 U/L — ABNORMAL HIGH (ref 15–41)
Albumin: 2.4 g/dL — ABNORMAL LOW (ref 3.5–5.0)
Alkaline Phosphatase: 122 U/L (ref 38–126)
Anion gap: 7 (ref 5–15)
BUN: 33 mg/dL — ABNORMAL HIGH (ref 8–23)
CO2: 22 mmol/L (ref 22–32)
Calcium: 8.1 mg/dL — ABNORMAL LOW (ref 8.9–10.3)
Chloride: 105 mmol/L (ref 98–111)
Creatinine, Ser: 1.23 mg/dL — ABNORMAL HIGH (ref 0.44–1.00)
GFR, Estimated: 50 mL/min — ABNORMAL LOW (ref >60)
Glucose, Bld: 422 mg/dL — ABNORMAL HIGH (ref 70–99)
Potassium: 5.5 mmol/L — ABNORMAL HIGH (ref 3.5–5.1)
Sodium: 134 mmol/L — ABNORMAL LOW (ref 135–145)
Total Bilirubin: 0.5 mg/dL (ref 0.3–1.2)
Total Protein: 6.7 g/dL (ref 6.5–8.1)

## 2022-05-14 LAB — URINE DRUG SCREEN, QUALITATIVE (ARMC ONLY)
Amphetamines, Ur Screen: NOT DETECTED
Barbiturates, Ur Screen: NOT DETECTED
Benzodiazepine, Ur Scrn: NOT DETECTED
Cannabinoid 50 Ng, Ur ~~LOC~~: NOT DETECTED
Cocaine Metabolite,Ur ~~LOC~~: NOT DETECTED
MDMA (Ecstasy)Ur Screen: NOT DETECTED
Methadone Scn, Ur: NOT DETECTED
Opiate, Ur Screen: NOT DETECTED
Phencyclidine (PCP) Ur S: NOT DETECTED
Tricyclic, Ur Screen: NOT DETECTED

## 2022-05-14 LAB — CBC
HCT: 32.3 % — ABNORMAL LOW (ref 36.0–46.0)
Hemoglobin: 10.2 g/dL — ABNORMAL LOW (ref 12.0–15.0)
MCH: 29.9 pg (ref 26.0–34.0)
MCHC: 31.6 g/dL (ref 30.0–36.0)
MCV: 94.7 fL (ref 80.0–100.0)
Platelets: 237 10*3/uL (ref 150–400)
RBC: 3.41 MIL/uL — ABNORMAL LOW (ref 3.87–5.11)
RDW: 17.2 % — ABNORMAL HIGH (ref 11.5–15.5)
WBC: 6 10*3/uL (ref 4.0–10.5)
nRBC: 0 % (ref 0.0–0.2)

## 2022-05-14 MED ORDER — BUPROPION HCL ER (XL) 150 MG PO TB24
150.0000 mg | ORAL_TABLET | Freq: Every day | ORAL | Status: DC
  Administered 2022-05-14 – 2022-05-17 (×4): 150 mg via ORAL
  Filled 2022-05-14 (×4): qty 1

## 2022-05-14 MED ORDER — QUETIAPINE FUMARATE ER 50 MG PO TB24
150.0000 mg | ORAL_TABLET | Freq: Every day | ORAL | Status: DC
  Administered 2022-05-14 – 2022-05-16 (×3): 150 mg via ORAL
  Filled 2022-05-14 (×3): qty 3

## 2022-05-14 MED ORDER — INSULIN DETEMIR 100 UNIT/ML ~~LOC~~ SOLN
7.0000 [IU] | Freq: Two times a day (BID) | SUBCUTANEOUS | Status: DC
  Administered 2022-05-14 – 2022-05-15 (×3): 7 [IU] via SUBCUTANEOUS
  Filled 2022-05-14 (×4): qty 0.07

## 2022-05-14 MED ORDER — SODIUM CHLORIDE 0.9 % IV SOLN: INTRAVENOUS | Status: DC

## 2022-05-14 MED ORDER — INSULIN ASPART 100 UNIT/ML IJ SOLN
0.0000 [IU] | Freq: Three times a day (TID) | INTRAMUSCULAR | Status: DC
  Administered 2022-05-14: 1 [IU] via SUBCUTANEOUS
  Administered 2022-05-14: 2 [IU] via SUBCUTANEOUS
  Administered 2022-05-14: 1 [IU] via SUBCUTANEOUS
  Administered 2022-05-15: 2 [IU] via SUBCUTANEOUS
  Filled 2022-05-14 (×3): qty 1

## 2022-05-14 MED ORDER — INSULIN ASPART 100 UNIT/ML IJ SOLN: 0.0000 [IU] | Freq: Every day | INTRAMUSCULAR | Status: DC

## 2022-05-14 MED ORDER — FLUDROCORTISONE ACETATE 0.1 MG PO TABS
0.0500 mg | ORAL_TABLET | Freq: Every day | ORAL | Status: DC
  Administered 2022-05-14 – 2022-05-17 (×5): 0.05 mg via ORAL
  Filled 2022-05-14 (×5): qty 0.5

## 2022-05-14 MED ORDER — INSULIN ASPART 100 UNIT/ML IJ SOLN
2.0000 [IU] | Freq: Three times a day (TID) | INTRAMUSCULAR | Status: DC
  Administered 2022-05-14 – 2022-05-15 (×4): 2 [IU] via SUBCUTANEOUS
  Filled 2022-05-14 (×3): qty 1

## 2022-05-14 MED ORDER — MIRTAZAPINE 15 MG PO TABS
15.0000 mg | ORAL_TABLET | Freq: Every day | ORAL | Status: DC
  Administered 2022-05-14 – 2022-05-16 (×3): 15 mg via ORAL
  Filled 2022-05-14 (×3): qty 1

## 2022-05-14 MED ORDER — INSULIN ASPART 100 UNIT/ML IJ SOLN
5.0000 [IU] | Freq: Once | INTRAMUSCULAR | Status: AC
  Administered 2022-05-14: 5 [IU] via SUBCUTANEOUS
  Filled 2022-05-14: qty 1

## 2022-05-14 MED ORDER — FIDAXOMICIN 200 MG PO TABS
200.0000 mg | ORAL_TABLET | Freq: Two times a day (BID) | ORAL | Status: DC
  Administered 2022-05-14 – 2022-05-17 (×7): 200 mg via ORAL
  Filled 2022-05-14 (×7): qty 1

## 2022-05-14 MED ORDER — SODIUM ZIRCONIUM CYCLOSILICATE 10 G PO PACK
10.0000 g | PACK | Freq: Every day | ORAL | Status: DC
  Administered 2022-05-14 – 2022-05-17 (×4): 10 g via ORAL
  Filled 2022-05-14 (×4): qty 1

## 2022-05-14 MED ORDER — QUETIAPINE FUMARATE ER 50 MG PO TB24: 50.0000 mg | ORAL_TABLET | Freq: Every day | ORAL | Status: DC

## 2022-05-14 MED ORDER — DIVALPROEX SODIUM 500 MG PO DR TAB
500.0000 mg | DELAYED_RELEASE_TABLET | Freq: Two times a day (BID) | ORAL | Status: DC
  Administered 2022-05-14 – 2022-05-17 (×7): 500 mg via ORAL
  Filled 2022-05-14 (×7): qty 1

## 2022-05-14 MED ORDER — ASPIRIN 81 MG PO TBEC
81.0000 mg | DELAYED_RELEASE_TABLET | Freq: Every day | ORAL | Status: DC
  Administered 2022-05-14 – 2022-05-17 (×4): 81 mg via ORAL
  Filled 2022-05-14 (×4): qty 1

## 2022-05-14 MED ORDER — GLUCERNA SHAKE PO LIQD
237.0000 mL | Freq: Three times a day (TID) | ORAL | Status: DC
  Administered 2022-05-14 – 2022-05-17 (×10): 237 mL via ORAL

## 2022-05-14 MED ORDER — ADULT MULTIVITAMIN W/MINERALS CH
1.0000 | ORAL_TABLET | Freq: Every day | ORAL | Status: DC
  Administered 2022-05-14 – 2022-05-17 (×4): 1 via ORAL
  Filled 2022-05-14 (×4): qty 1

## 2022-05-14 MED ORDER — ATORVASTATIN CALCIUM 20 MG PO TABS
40.0000 mg | ORAL_TABLET | Freq: Every day | ORAL | Status: DC
  Administered 2022-05-14 – 2022-05-16 (×3): 40 mg via ORAL
  Filled 2022-05-14 (×3): qty 2

## 2022-05-14 NOTE — Assessment & Plan Note (Signed)
Could be secondary to C. difficile infection.

## 2022-05-14 NOTE — Assessment & Plan Note (Signed)
6.4 x 4.7 x 3.4 cm multiloculated cystic mass at the head of the pancreas on MRI of the abdomen back in January 2023.

## 2022-05-14 NOTE — ED Notes (Signed)
Transport called.

## 2022-05-14 NOTE — Evaluation (Signed)
Physical Therapy Evaluation Patient Details Name: Brittany [name] MRN: [id number] DOB: [birth year] Today's Date: 05/14/2022  History of Present Illness  presented to ER and admitted for acute hospitalization secondary to decreased responsiveness, hypoglycemia  Clinical Impression  Patient sleeping in bed upon arrival to room; easily awakens to voice/light touch.  Oriented to self, location and general situation; generally impulsive and easily confused with more complex information.  Bilat UE/LE strength and ROM grossly symmetrical and WFL; no focal weakness appreciated.  Currently requiring close sup for bed mobility; min/mod assist for sit/stand, standing balance and in-room mobility (20') with HHA/IV pole.  Demonstrates heavy R lateral lean with narrowed, near-tandem BOS (requiring mod assist to maintain balance), especially in narrowed spaces, when negotiating obstacles or performing divided-attention tasks. Does spontaneously correct for brief periods of time, improving to min assist, but does not maintain. May consider trial of RW next session  Would benefit from skilled PT to address above deficits and promote optimal return to PLOF.; will benefit optimally from moderate intensity post-acute PT services (<3 hours/day) and consistent assist for ADLs/mobility.      Recommendations for follow up therapy are one component of a multi-disciplinary discharge planning process, led by the attending physician.  Recommendations may be updated based on patient status, additional functional criteria and insurance authorization.  Follow Up Recommendations       Assistance Recommended at Discharge Frequent or constant Supervision/Assistance  Patient can return home with the following  A lot of help with walking and/or transfers;A lot of help with bathing/dressing/bathroom    Equipment Recommendations    Recommendations for Other Services       Functional Status Assessment Patient has had a recent  decline in their functional status and demonstrates the ability to make significant improvements in function in a reasonable and predictable amount of time.     Precautions / Restrictions Precautions Precautions: Fall Precaution Comments: L LQ ostomy Restrictions Weight Bearing Restrictions: No      Mobility  Bed Mobility Overal bed mobility: Needs Assistance Bed Mobility: Supine to Sit     Supine to sit: Supervision, Modified independent (Device/Increase time)          Transfers Overall transfer level: Needs assistance Equipment used: Rolling walker (2 wheels) Transfers: Sit to/from Stand Sit to Stand: Min assist, Mod assist           General transfer comment: fluctuating performance and level of assist required, min/mod assist for balance and safety (R lateral lean at times)    Ambulation/Gait Ambulation/Gait assistance: Min assist, Mod assist Gait Distance (Feet): 20 Feet Assistive device: IV Pole         General Gait Details: heavy R lateral lean with narrowed, near-tandem BOS (requiring mod assist to maintain balance), especially in narrowed spaces, when negotiating obstacles or performing divided-attention tasks.  Does spontaneously correct for brief periods of time, improving to min assist, but does not maintain.  May consider trial of RW next session  Stairs            Wheelchair Mobility    Modified Rankin (Stroke Patients Only)       Balance Overall balance assessment: Needs assistance Sitting-balance support: No upper extremity supported, Feet supported Sitting balance-Leahy Scale: Good     Standing balance support: No upper extremity supported Standing balance-Leahy Scale: Poor  Pertinent Vitals/Pain Pain Assessment Pain Assessment: No/denies pain    Home Living Family/patient expects to be discharged to:: Assisted living                   Additional Comments: Per patient, living  at [facility] (for approx six months)    Prior Function Prior Level of Function : Needs assist             Mobility Comments: Per patient, ambulatory without assist device; does endorse multiple fall history ("get dizzy when getting up"), but unable to quantify ADLs Comments: Pt is a poor historian, she reports she was independent with her basic self care tasks and her daughter helps her with her medication and colostomy care however her chart indicates she lives in assisted living.     Hand Dominance   Dominant Hand: Right    Extremity/Trunk Assessment   Upper Extremity Assessment Upper Extremity Assessment: Overall WFL for tasks assessed    Lower Extremity Assessment Lower Extremity Assessment: Overall WFL for tasks assessed (grossly at least 4/5 throughout)       Communication   Communication: No difficulties  Cognition Arousal/Alertness: Awake/alert Behavior During Therapy: WFL for tasks assessed/performed Overall Cognitive Status: No family/caregiver present to determine baseline cognitive functioning                                 General Comments: Oriented to self, location and month; intermittent confusion, especially with more complex information; generally impulsive        General Comments      Exercises Other Exercises Other Exercises: Sit/stand from edge of bed, min/mod assist; standing balance for orthostatic assessment (see vitals flowsheet for details) and hygiene, min/mod assist.  Dep assist for hygiene, clothing management during session (ostomy leaking)   Assessment/Plan    PT Assessment Patient needs continued PT services  PT Problem List Decreased activity tolerance;Decreased balance;Decreased mobility;Decreased coordination;Decreased cognition;Decreased knowledge of use of DME;Decreased safety awareness;Decreased knowledge of precautions       PT Treatment Interventions DME instruction;Gait training;Functional mobility  training;Therapeutic activities;Therapeutic exercise;Balance training;Cognitive remediation;Patient/family education    PT Goals (Current goals can be found in the Care Plan section)  Acute Rehab PT Goals Patient Stated Goal: to get back home PT Goal Formulation: With patient Time For Goal Achievement: 05/28/22 Potential to Achieve Goals: Fair    Frequency Min 3X/week     Co-evaluation               AM-PAC PT "6 Clicks" Mobility  Outcome Measure Help needed turning from your back to your side while in a flat bed without using bedrails?: A Little Help needed moving from lying on your back to sitting on the side of a flat bed without using bedrails?: A Little Help needed moving to and from a bed to a chair (including a wheelchair)?: A Lot Help needed standing up from a chair using your arms (e.g., wheelchair or bedside chair)?: A Lot Help needed to walk in hospital room?: A Lot Help needed climbing 3-5 steps with a railing? : A Lot 6 Click Score: 14    End of Session   Activity Tolerance: Patient tolerated treatment well Patient left: in chair;with call bell/phone within reach;with chair alarm set;with nursing/sitter in room Nurse Communication: Mobility status PT Visit Diagnosis: Muscle weakness (generalized) (M62.81);Difficulty in walking, not elsewhere classified (R26.2)    Time: 1410-1440 PT Time Calculation (min) (  ACUTE ONLY): 30 min   Charges:   PT Evaluation $PT Eval Moderate Complexity: 1 Mod PT Treatments $Therapeutic Activity: 8-22 mins        Jaslynn Thome H. Owens Shark, PT, DPT, NCS 05/14/22, 4:11 PM [phone]

## 2022-05-14 NOTE — Assessment & Plan Note (Signed)
Patient on Florinef

## 2022-05-14 NOTE — Assessment & Plan Note (Signed)
Continue Lokelma?

## 2022-05-14 NOTE — Hospital Course (Signed)
62 year old female with labile diabetes, COPD, hyperkalemia, hyponatremia, lupus, pancreatic mass.  Patient coming in with hypoglycemia and hypothermia.  Stool for C. difficile positive.  Started on Dificid.

## 2022-05-14 NOTE — TOC Benefit Eligibility Note (Signed)
Patient Teacher, English as a foreign language completed.    The patient is currently admitted and upon discharge could be taking Dificid 200 mg tablets.  The current 10 day co-pay is $11.20.   The patient is insured through [employer] Part D   This test claim was processed through [facility] amounts may vary at other pharmacies due to pharmacy/plan contracts, or as the patient moves through the different stages of their insurance plan.  Lyndel Safe, [place] Patient Advocate Specialist [place] Patient Advocate Team Direct Number: [phone]  Fax: [phone]

## 2022-05-14 NOTE — ED Notes (Signed)
Report given to Meghan RN

## 2022-05-14 NOTE — ED Notes (Signed)
Pt assisted to toilet to empty colostomy. Pt back in bed.

## 2022-05-14 NOTE — Assessment & Plan Note (Signed)
Sodium normal range ?

## 2022-05-14 NOTE — ED Notes (Signed)
Pt's colostomy bag leaked. Bed changed and bag replaced

## 2022-05-14 NOTE — Progress Notes (Signed)
Progress Note   [name] [id number] DOB: 06-14-1960 DOA: 05/13/2022     0 DOS: the patient was seen and examined on 05/14/2022   Brief hospital course: 62 year old female with labile diabetes, COPD, hyperkalemia, hyponatremia, lupus, pancreatic mass.  Patient coming in with hypoglycemia and hypothermia.  Stool for C. difficile positive.  Started on Dificid.  Assessment and Plan: * Recurrent colitis due to Clostridium difficile Patient came in with hypothermia and had a quite a bit of diarrhea and her colostomy.  Treated with vancomycin back in January.  Will switch to Dificid 10-day course.  Hyperkalemia Continue Lokelma  Diabetes mellitus, labile Initially admitted with hypoglycemia and now hyperglycemia at 3 AM.  Restart lower dose Levemir insulin and sliding scale insulin.  Hyponatremia Continue fluids today  AKI (acute kidney injury) Creatinine 1.38 on presentation down to 1.23.  Continue IV fluids.    Hypothermia Could be secondary to C. difficile infection.  Orthostatic hypotension Patient on Florinef  Chronic diastolic CHF (congestive heart failure) No signs of heart failure.  Watch closely with IV fluids.  Seizure On Depakote  Depression with anxiety Continue Remeron and Wellbutrin  COPD (chronic obstructive pulmonary disease) Respiratory status stable  Tobacco dependence Nicotine patch.   Dementia without behavioral disturbance Patient is currently on Seroquel, Remeron, Depakote.  Patient is also on Wellbutrin.  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        Subjective: Patient states that she does not sleep even at home.  Admitted with low sugar and also had a low temperature.  Stool positive for C. difficile colitis.  Physical Exam: Vitals:   05/14/22 0700 05/14/22 0809 05/14/22 1127 05/14/22 1205  BP: (!) 130/91  116/88 117/84   Pulse:   81 77  Resp:   18 17  Temp:  98 F (36.7 C) 98.7 F (37.1 C) 97.7 F (36.5 C)  TempSrc:  Oral Oral   SpO2:   100% 91%  Weight:       Physical Exam HENT:     Head: Normocephalic.     Mouth/Throat:     Pharynx: No oropharyngeal exudate.  Eyes:     General: Lids are normal.     Conjunctiva/sclera: Conjunctivae normal.  Cardiovascular:     Rate and Rhythm: Normal rate and regular rhythm.     Heart sounds: Normal heart sounds, S1 normal and S2 normal.  Pulmonary:     Breath sounds: No decreased breath sounds, wheezing, rhonchi or rales.  Abdominal:     Palpations: Abdomen is soft.     Tenderness: There is no abdominal tenderness.  Musculoskeletal:     Right lower leg: No swelling.     Left lower leg: No swelling.  Skin:    General: Skin is warm.     Findings: No rash.  Neurological:     Mental Status: She is alert.     Comments: Answers questions appropriately.     Data Reviewed: Sugar at 3 in the morning 426.  Sugar this morning 192.  Sugar at lunchtime 122. Sodium 134, potassium 5.5 creatinine 1.23, white blood cell count 6.0, hemoglobin 10.2  Family Communication: Updated guardian on the phone  Disposition: Status is: Changed to inpatient. Patient lives at [facility] and has a roommate.  Diagnosed with C. difficile colitis.  Since this is a recurrence we will treat with Dificid.  Planned  Discharge Destination: Back to her facility at some point.    Time spent: 28  minutes  Author: Loletha Grayer, MD 05/14/2022 1:49 PM  For on call review [email or web address].

## 2022-05-14 NOTE — ED Notes (Signed)
Posey alarm has gone off, Pt is found at end of bed trying to rip cords off to use the bathroom. Pt reoriented and ambulated to toilet with assistance to urinate. Pts colostomy bag emptied as well. Pts gait unsteady, Pt assisted back to bed with both side rails raised and posey alarm back on

## 2022-05-14 NOTE — Assessment & Plan Note (Signed)
Patient came in with hypothermia and had a quite a bit of diarrhea and her colostomy.  Treated with vancomycin back in January.  Will switch to Dificid 10-day course.

## 2022-05-14 NOTE — Assessment & Plan Note (Signed)
Initially admitted with hypoglycemia and now hyperglycemia at 3 AM.  Restart lower dose Levemir insulin and sliding scale insulin.

## 2022-05-14 NOTE — ED Notes (Signed)
Pt insisted she wanted to call her daughter [name], this RN dialed the number and gave pt the phone. [name] did not answer and the pt left a voicemail.

## 2022-05-15 DIAGNOSIS — E109 Type 1 diabetes mellitus without complications: Secondary | ICD-10-CM | POA: Diagnosis not present

## 2022-05-15 DIAGNOSIS — E871 Hypo-osmolality and hyponatremia: Secondary | ICD-10-CM | POA: Diagnosis not present

## 2022-05-15 DIAGNOSIS — N179 Acute kidney failure, unspecified: Secondary | ICD-10-CM | POA: Diagnosis not present

## 2022-05-15 DIAGNOSIS — A0471 Enterocolitis due to Clostridium difficile, recurrent: Secondary | ICD-10-CM | POA: Diagnosis not present

## 2022-05-15 LAB — CBC
HCT: 30.3 % — ABNORMAL LOW (ref 36.0–46.0)
Hemoglobin: 10 g/dL — ABNORMAL LOW (ref 12.0–15.0)
MCH: 30 pg (ref 26.0–34.0)
MCHC: 33 g/dL (ref 30.0–36.0)
MCV: 91 fL (ref 80.0–100.0)
Platelets: 282 10*3/uL (ref 150–400)
RBC: 3.33 MIL/uL — ABNORMAL LOW (ref 3.87–5.11)
RDW: 16.8 % — ABNORMAL HIGH (ref 11.5–15.5)
WBC: 7.2 10*3/uL (ref 4.0–10.5)
nRBC: 0.3 % — ABNORMAL HIGH (ref 0.0–0.2)

## 2022-05-15 LAB — BASIC METABOLIC PANEL
Anion gap: 5 (ref 5–15)
BUN: 25 mg/dL — ABNORMAL HIGH (ref 8–23)
CO2: 23 mmol/L (ref 22–32)
Calcium: 8.4 mg/dL — ABNORMAL LOW (ref 8.9–10.3)
Chloride: 111 mmol/L (ref 98–111)
Creatinine, Ser: 1.07 mg/dL — ABNORMAL HIGH (ref 0.44–1.00)
GFR, Estimated: 59 mL/min — ABNORMAL LOW (ref >60)
Glucose, Bld: 331 mg/dL — ABNORMAL HIGH (ref 70–99)
Potassium: 5.2 mmol/L — ABNORMAL HIGH (ref 3.5–5.1)
Sodium: 139 mmol/L (ref 135–145)

## 2022-05-15 LAB — GLUCOSE, CAPILLARY
Glucose-Capillary: 235 mg/dL — ABNORMAL HIGH (ref 70–99)
Glucose-Capillary: 43 mg/dL — CL (ref 70–99)
Glucose-Capillary: 46 mg/dL — ABNORMAL LOW (ref 70–99)
Glucose-Capillary: 136 mg/dL — ABNORMAL HIGH (ref 70–99)
Glucose-Capillary: 313 mg/dL — ABNORMAL HIGH (ref 70–99)
Glucose-Capillary: 287 mg/dL — ABNORMAL HIGH (ref 70–99)
Glucose-Capillary: 85 mg/dL (ref 70–99)

## 2022-05-15 LAB — HIV ANTIBODY (ROUTINE TESTING W REFLEX): HIV Screen 4th Generation wRfx: NONREACTIVE

## 2022-05-15 MED ORDER — INSULIN DETEMIR 100 UNIT/ML ~~LOC~~ SOLN
6.0000 [IU] | Freq: Two times a day (BID) | SUBCUTANEOUS | Status: DC
  Filled 2022-05-15: qty 0.06

## 2022-05-15 MED ORDER — SODIUM CHLORIDE 0.9 % IV SOLN: INTRAVENOUS | Status: DC

## 2022-05-15 MED ORDER — INSULIN ASPART 100 UNIT/ML IJ SOLN
0.0000 [IU] | Freq: Three times a day (TID) | INTRAMUSCULAR | Status: DC
  Administered 2022-05-15: 3 [IU] via SUBCUTANEOUS
  Administered 2022-05-16 (×2): 2 [IU] via SUBCUTANEOUS
  Administered 2022-05-16: 6 [IU] via SUBCUTANEOUS
  Filled 2022-05-15 (×4): qty 1

## 2022-05-15 MED ORDER — INSULIN DETEMIR 100 UNIT/ML ~~LOC~~ SOLN
5.0000 [IU] | Freq: Two times a day (BID) | SUBCUTANEOUS | Status: DC
  Filled 2022-05-15: qty 0.05

## 2022-05-15 MED ORDER — INSULIN ASPART 100 UNIT/ML IJ SOLN
0.0000 [IU] | Freq: Every day | INTRAMUSCULAR | Status: DC
  Administered 2022-05-16: 2 [IU] via SUBCUTANEOUS
  Filled 2022-05-15: qty 1

## 2022-05-15 MED ORDER — INSULIN DETEMIR 100 UNIT/ML ~~LOC~~ SOLN
7.0000 [IU] | Freq: Two times a day (BID) | SUBCUTANEOUS | Status: DC
  Administered 2022-05-15 – 2022-05-16 (×3): 7 [IU] via SUBCUTANEOUS
  Filled 2022-05-15 (×4): qty 0.07
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
## 2022-05-15 NOTE — Progress Notes (Signed)
Physical Therapy Treatment Patient Details Name: Brittany [name] MRN: [id number] DOB: 1960-05-01 Today's Date: 05/15/2022   History of Present Illness presented to ER and admitted for acute hospitalization secondary to decreased responsiveness, hypoglycemia    PT Comments    Patient sitting edge of bed independently upon arrival to room. Initially alert and interactive, but generally confused/disoriented.  Spontaneously returns self to supine (indep) mid-conversation and drifts to sleep; difficulty re-awakening and maintaining alertness for participation with session afterwards.  CNA to bedside to assess FSBS (313); BP stable and WFL (supine 103/79, HR 91; sitting BP 91/72, HR 97).   Additional therex and mobility activities deferred due to lethargy at this point; will continue efforts at later time/date as medically appropriate and available.   Recommendations for follow up therapy are one component of a multi-disciplinary discharge planning process, led by the attending physician.  Recommendations may be updated based on patient status, additional functional criteria and insurance authorization.  Follow Up Recommendations       Assistance Recommended at Discharge Frequent or constant Supervision/Assistance  Patient can return home with the following A lot of help with walking and/or transfers;A lot of help with bathing/dressing/bathroom   Equipment Recommendations       Recommendations for Other Services       Precautions / Restrictions Precautions Precautions: Fall Precaution Comments: L LQ ostomy Restrictions Weight Bearing Restrictions: No     Mobility  Bed Mobility               General bed mobility comments: seated edge of bed indep upon arrival to room; spontaneously transitions self to supine    Transfers                   General transfer comment: deferred due to lethargy    Ambulation/Gait               General Gait Details: deferred  due to lethargy   Stairs             Wheelchair Mobility    Modified Rankin (Stroke Patients Only)       Balance Overall balance assessment: Needs assistance Sitting-balance support: No upper extremity supported, Feet supported Sitting balance-Leahy Scale: Good                                      Cognition Arousal/Alertness: Lethargic Behavior During Therapy: Flat affect Overall Cognitive Status: No family/caregiver present to determine baseline cognitive functioning                                 General Comments: impulsive, difficulty maintaining eyes open during session, follows cues with repetition        Exercises Other Exercises Other Exercises: Attempted additional therex, mobility tasks; unable to maintain alertness for full participation with session once returned to supine.    General Comments        Pertinent Vitals/Pain Pain Assessment Pain Assessment: No/denies pain    Home Living                          Prior Function            PT Goals (current goals can now be found in the care plan section) Acute Rehab PT Goals Patient Stated Goal: to get back  home PT Goal Formulation: With patient Time For Goal Achievement: 05/28/22 Potential to Achieve Goals: Fair Progress towards PT goals: Progressing toward goals    Frequency    Min 3X/week      PT Plan Current plan remains appropriate    Co-evaluation              AM-PAC PT "6 Clicks" Mobility   Outcome Measure  Help needed turning from your back to your side while in a flat bed without using bedrails?: None Help needed moving from lying on your back to sitting on the side of a flat bed without using bedrails?: None Help needed moving to and from a bed to a chair (including a wheelchair)?: A Lot Help needed standing up from a chair using your arms (e.g., wheelchair or bedside chair)?: A Lot Help needed to walk in hospital room?: A  Lot Help needed climbing 3-5 steps with a railing? : A Lot 6 Click Score: 16    End of Session   Activity Tolerance: Patient limited by lethargy Patient left: in bed;with call bell/phone within reach;with bed alarm set Nurse Communication: Mobility status PT Visit Diagnosis: Muscle weakness (generalized) (M62.81);Difficulty in walking, not elsewhere classified (R26.2)     Time: 1540-1600 PT Time Calculation (min) (ACUTE ONLY): 20 min  Charges:  $Therapeutic Activity: 8-22 mins                    Hazaiah Edgecombe H. Owens Shark, PT, DPT, NCS 05/15/22, 4:10 PM [phone]

## 2022-05-15 NOTE — TOC Progression Note (Signed)
Transition of Care [facility]) - Progression Note    Patient Details  Name: Brittany [name] MRN: [id number] Date of Birth: [birth year]  Transition of Care [facility]) CM/SW Contact  Gerilyn Pilgrim, LCSW Phone Number: 05/15/2022, 4:25 PM  Clinical Narrative:   Josem Kaufmann started for [facility].          Expected Discharge Plan and Services                                               Social Determinants of Health (SDOH) Interventions SDOH Screenings   Food Insecurity: No Food Insecurity (05/14/2022)  Housing: Low Risk  (05/14/2022)  Transportation Needs: No Transportation Needs (05/14/2022)  Utilities: Not At Risk (05/14/2022)  Tobacco Use: High Risk (05/14/2022)    Readmission Risk Interventions    12/29/2021    1:48 PM 09/22/2021    1:24 PM 03/06/2021    9:13 AM  Readmission Risk Prevention Plan  Transportation Screening Complete Complete Complete  Medication Review Press photographer) Complete Complete Complete  PCP or Specialist appointment within 3-5 days of discharge  Complete Complete  HRI or Home Care Consult Complete Complete   SW Recovery Care/Counseling Consult Complete Complete   Palliative Care Screening Not Applicable Not Applicable Not [place] Not Applicable Complete Not Applicable

## 2022-05-15 NOTE — Inpatient Diabetes Management (Addendum)
Inpatient Diabetes Program Recommendations  AACE/ADA: New Consensus Statement on Inpatient Glycemic Control (2015)  Target Ranges:  Prepandial:   less than 140 mg/dL      Peak postprandial:   less than 180 mg/dL (1-2 hours)      Critically ill patients:  140 - 180 mg/dL   Lab Results  Component Value Date   GLUCAP 46 (L) 05/15/2022   HGBA1C 11.0 (H) 02/14/2022    Review of Glycemic Control  Latest Reference Range & Units 05/15/22 07:43 05/15/22 11:49  Glucose-Capillary 70 - 99 mg/dL 235 (H)  Novolog 4 units  46 (L)  (H): Data is abnormally high (L): Data is abnormally low  DM1(does not make insulin.  Needs correction, basal and meal coverage)  Current DM Medication inpatient:  Levemir 7 units BID, Novlog 0-9 units TID & 0-5 units QHS, Novolog 2 units TID with meals    Inpatient Diabetes Program Recommendations:    Please consider Novolog very sensitive correction-0-6 units TID & Levemir 9 units BID  Will continue to follow while inpatient.  Thank you, Reche Dixon, MSN, [place] Diabetes Coordinator Inpatient Diabetes Program [phone] (team pager from 8a-5p)
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
## 2022-05-15 NOTE — Evaluation (Signed)
Occupational Therapy Evaluation Brittany [name] Details Name: Brittany [name] MRN: [id number] DOB: 05/08/1960 Today's Date: 05/15/2022   History of Present Illness presented to ER and admitted for acute hospitalization secondary to decreased responsiveness, hypoglycemia   Clinical Impression   Brittany [name] was seen for OT evaluation this date. Prior to hospital admission, pt was ambulatory at ALF wihtout AD, endorses falls hx. Pt presents to acute OT demonstrating impaired ADL performance and functional mobility 2/2 decreased activity tolerance and functional strength/ROM/balance deficits. Pt currently requires SUPERVISION sup<>sit, upon sitting pt quickly returns to supine with eyes closed, decreased alertness noted but responds to questions with 1 word / head shakes. Agreeable to standing trial, requires MOD A sit<>stand x2, L lateral lean noted. MOD A don/doff underwear, assist for standing balance and pulling up over rear. Pt would benefit from skilled OT to address noted impairments and functional limitations. Upon hospital discharge, recommend follow up therapy.   Recommendations for follow up therapy are one component of a multi-disciplinary discharge planning process, led by the attending physician.  Recommendations may be updated based on Brittany [name] status, additional functional criteria and insurance authorization.   Assistance Recommended at Discharge Frequent or constant Supervision/Assistance  Brittany [name] can return home with the following A lot of help with walking and/or transfers;A lot of help with bathing/dressing/bathroom;Help with stairs or ramp for entrance    Functional Status Assessment  Brittany [name] has had a recent decline in their functional status and demonstrates the ability to make significant improvements in function in a reasonable and predictable amount of time.  Equipment Recommendations  Other (comment) (defer)    Recommendations for Other Services       Precautions / Restrictions  Precautions Precautions: Fall Precaution Comments: L LQ ostomy Restrictions Weight Bearing Restrictions: No      Mobility Bed Mobility Overal bed mobility: Needs Assistance Bed Mobility: Supine to Sit, Sit to Supine     Supine to sit: Min guard Sit to supine: Min guard        Transfers Overall transfer level: Needs assistance Equipment used: 1 person hand held assist Transfers: Sit to/from Stand Sit to Stand: Mod assist           General transfer comment: x2 standing, L lateral lean noted      Balance Overall balance assessment: Needs assistance Sitting-balance support: No upper extremity supported, Feet supported Sitting balance-Leahy Scale: Good     Standing balance support: Single extremity supported Standing balance-Leahy Scale: Poor                             ADL either performed or assessed with clinical judgement   ADL Overall ADL's : Needs assistance/impaired                                       General ADL Comments: MOD A don/doff underwear, assist for standing balance and pulling up over rear. SETUP self-feeding at bed level      Pertinent Vitals/Pain Pain Assessment Pain Assessment: No/denies pain     Hand Dominance Right   Extremity/Trunk Assessment Upper Extremity Assessment Upper Extremity Assessment: Overall WFL for tasks assessed   Lower Extremity Assessment Lower Extremity Assessment: Generalized weakness       Communication Communication Communication: No difficulties   Cognition Arousal/Alertness: Lethargic, Awake/alert Behavior During Therapy: WFL for tasks assessed/performed Overall Cognitive Status: No family/caregiver  present to determine baseline cognitive functioning                                 General Comments: impulsive, difficulty maintaining eyes open during session, follows cues with repetition                [place] expects to be  discharged to:: Assisted living                                 Additional Comments: Per Brittany [name], living at [facility] (for approx six months)      Prior Functioning/Environment Prior Level of Function : Needs assist             Mobility Comments: Per Brittany [name], ambulatory without assist device; does endorse multiple fall history ("get dizzy when getting up"), but unable to quantify ADLs Comments: Pt is a poor historian, she reports she was independent with her basic self care tasks and her daughter helps her with her medication and colostomy care however her chart indicates she lives in assisted living.        OT Problem List: Decreased strength;Decreased range of motion;Decreased activity tolerance;Impaired balance (sitting and/or standing);Decreased safety awareness      OT Treatment/Interventions: Self-care/ADL training;Therapeutic exercise;Energy conservation;DME and/or AE instruction;Therapeutic activities;Brittany [name]/family education;Balance training    OT Goals(Current goals can be found in the care plan section) Acute Rehab OT Goals Brittany [name] Stated Goal: to walk OT Goal Formulation: With Brittany [name] Time For Goal Achievement: 05/29/22 Potential to Achieve Goals: Good ADL Goals Pt Will Perform Grooming: with supervision;standing Pt Will Perform Lower Body Dressing: with modified independence;sit to/from stand Pt Will Transfer to Toilet: with supervision;ambulating;regular height toilet  OT Frequency: Min 2X/week    Co-evaluation              AM-PAC OT "6 Clicks" Daily Activity     Outcome Measure Help from another person eating meals?: None Help from another person taking care of personal grooming?: A Little Help from another person toileting, which includes using toliet, bedpan, or urinal?: A Lot Help from another person bathing (including washing, rinsing, drying)?: A Lot Help from another person to put on and taking off regular upper body  clothing?: A Little Help from another person to put on and taking off regular lower body clothing?: A Lot 6 Click Score: 16   End of Session    Activity Tolerance: Brittany [name] tolerated treatment well Brittany [name] left: in bed;with call bell/phone within reach;with bed alarm set  OT Visit Diagnosis: Other abnormalities of gait and mobility (R26.89);Muscle weakness (generalized) (M62.81)                Time: [id number] OT Time Calculation (min): 10 min Charges:  OT General Charges $OT Visit: 1 Visit OT Evaluation $OT Eval Low Complexity: 1 Low  Dessie Coma, M.S. OTR/L  05/15/22, 10:32 AM  ascom [phone]

## 2022-05-15 NOTE — Progress Notes (Signed)
Progress Note   [name] [id number] DOB: 1960-06-11 DOA: 05/13/2022     1 DOS: the patient was seen and examined on 05/15/2022   Brief hospital course: 62 year old female with labile diabetes, COPD, hyperkalemia, hyponatremia, lupus, pancreatic mass.  Patient coming in with hypoglycemia and hypothermia.  Stool for C. difficile positive.  Started on Dificid.  Assessment and Plan: * Diabetes mellitus, labile Another episode of hypoglycemia today of 43.  Patient is type 1 diabetes with very labile sugars.  Will decrease Levemir to 6 units twice a day and cut back on sliding scale to very sensitive.  Recurrent colitis due to Clostridium difficile Patient came in with hypothermia and had a quite a bit of diarrhea and her colostomy.  Treated with vancomycin back in January.  Continue Dificid 10-day course.  Hyponatremia Sodium normal range.  AKI (acute kidney injury) Creatinine 1.38 on presentation down to 1.07.  Continue IV fluids today.    Hyperkalemia Continue Lokelma  Hypothermia Could be secondary to C. difficile infection.  Orthostatic hypotension Patient on Florinef  Chronic diastolic CHF (congestive heart failure) No signs of heart failure.  Watch closely with IV fluids.  Seizure On Depakote  Depression with anxiety Continue Remeron and Wellbutrin  COPD (chronic obstructive pulmonary disease) Respiratory status stable  Tobacco dependence Nicotine patch.   Dementia without behavioral disturbance Patient is currently on Seroquel, Remeron, Depakote.  Patient is also on Wellbutrin.  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        Subjective: Patient seen this morning answered a few questions but went back to sleep.  Another episode of hypoglycemia today.  On Dificid treatment for recurrent C. difficile colitis  Physical Exam: Vitals:   05/14/22 2019  05/15/22 0426 05/15/22 0500 05/15/22 0742  BP: 119/79 (!) 82/61  92/68  Pulse: 75 82  86  Resp: 20 18  16   Temp: 98.7 F (37.1 C) 98.9 F (37.2 C)  97.8 F (36.6 C)  TempSrc:    Oral  SpO2: 92% 91%  100%  Weight:   40.8 kg    Physical Exam HENT:     Head: Normocephalic.     Mouth/Throat:     Pharynx: No oropharyngeal exudate.  Eyes:     General: Lids are normal.     Conjunctiva/sclera: Conjunctivae normal.  Cardiovascular:     Rate and Rhythm: Normal rate and regular rhythm.     Heart sounds: Normal heart sounds, S1 normal and S2 normal.  Pulmonary:     Breath sounds: No decreased breath sounds, wheezing, rhonchi or rales.  Abdominal:     Palpations: Abdomen is soft.     Tenderness: There is no abdominal tenderness.  Musculoskeletal:     Right lower leg: No swelling.     Left lower leg: No swelling.  Skin:    General: Skin is warm.     Findings: No rash.  Neurological:     Mental Status: She is lethargic.     Comments: Answered a few questions and went back to sleep     Data Reviewed: Last sugar 136, prior 43. Potassium 5.2, creatinine 1.07, glucose on this morning's chemistry 331, hemoglobin 10.0, white blood cell count 7.2  Family Communication: Left message for guardian  Disposition: Status is: Inpatient Remains inpatient appropriate because: Still with labile sugars today.  Cutting back short acting insulin sliding scale  and long-acting insulin.  Planned Discharge Destination: Group home    Time spent: 28 minutes  Author: Loletha Grayer, MD 05/15/2022 12:50 PM  For on call review [email or web address].

## 2022-05-16 DIAGNOSIS — E109 Type 1 diabetes mellitus without complications: Secondary | ICD-10-CM | POA: Diagnosis not present

## 2022-05-16 LAB — GLUCOSE, CAPILLARY
Glucose-Capillary: 202 mg/dL — ABNORMAL HIGH (ref 70–99)
Glucose-Capillary: 246 mg/dL — ABNORMAL HIGH (ref 70–99)
Glucose-Capillary: 405 mg/dL — ABNORMAL HIGH (ref 70–99)
Glucose-Capillary: 385 mg/dL — ABNORMAL HIGH (ref 70–99)
Glucose-Capillary: 294 mg/dL — ABNORMAL HIGH (ref 70–99)
Glucose-Capillary: 222 mg/dL — ABNORMAL HIGH (ref 70–99)

## 2022-05-16 MED ORDER — INSULIN ASPART 100 UNIT/ML IJ SOLN
10.0000 [IU] | Freq: Once | INTRAMUSCULAR | Status: AC
  Administered 2022-05-16: 10 [IU] via SUBCUTANEOUS
  Filled 2022-05-16: qty 1

## 2022-05-16 MED ORDER — INSULIN ASPART 100 UNIT/ML IJ SOLN
4.0000 [IU] | Freq: Once | INTRAMUSCULAR | Status: AC
  Administered 2022-05-16: 4 [IU] via SUBCUTANEOUS
  Filled 2022-05-16: qty 1

## 2022-05-16 NOTE — TOC Progression Note (Signed)
Transition of Care [facility]) - Progression Note    Patient Details  Name: Brittany [name] MRN: [id number] Date of Birth: Dec 13, 1960  [facility]) CM/SW Contact  Gerilyn Pilgrim, LCSW Phone Number: 05/16/2022, 2:39 PM  Clinical Narrative:   Josem Kaufmann approved for [facility]. Pt will discharge tomorrow. [facility] notified.          Expected Discharge Plan and Services                                               Social Determinants of Health (SDOH) Interventions SDOH Screenings   Food Insecurity: No Food Insecurity (05/14/2022)  Housing: Low Risk  (05/14/2022)  Transportation Needs: No Transportation Needs (05/14/2022)  Utilities: Not At Risk (05/14/2022)  Tobacco Use: High Risk (05/14/2022)    Readmission Risk Interventions    12/29/2021    1:48 PM 09/22/2021    1:24 PM 03/06/2021    9:13 AM  Readmission Risk Prevention Plan  Transportation Screening Complete Complete Complete  Medication Review Press photographer) Complete Complete Complete  PCP or Specialist appointment within 3-5 days of discharge  Complete Complete  HRI or Home Care Consult Complete Complete   SW Recovery Care/Counseling Consult Complete Complete   Palliative Care Screening Not Applicable Not Applicable Not [place] Not Applicable Complete Not Applicable

## 2022-05-16 NOTE — Progress Notes (Signed)
Physical Therapy Treatment Patient Details Name: SAVITRI PETROW MRN: [id number] DOB: 12/27/1960 Today's Date: 05/16/2022   History of Present Illness presented to ER and admitted for acute hospitalization secondary to decreased responsiveness, hypoglycemia    PT Comments    Patient received in recliner. She is agreeable to PT session. She reports she has been ambulating to bathroom. She requires min guard to stand and min guard for ambulation 40 feet with RW. Patient appears to be making good progress with mobility and awareness this session. She will continue to benefit from skilled PT to improve safety and strength for safe discharge.     Recommendations for follow up therapy are one component of a multi-disciplinary discharge planning process, led by the attending physician.  Recommendations may be updated based on patient status, additional functional criteria and insurance authorization.  Follow Up Recommendations       Assistance Recommended at Discharge Frequent or constant Supervision/Assistance  Patient can return home with the following A little help with walking and/or transfers;A little help with bathing/dressing/bathroom   Equipment Recommendations  Rolling walker (2 wheels)    Recommendations for Other Services       Precautions / Restrictions Precautions Precautions: Fall Precaution Comments: L LQ ostomy Restrictions Weight Bearing Restrictions: No     Mobility  Bed Mobility Overal bed mobility: Modified Independent Bed Mobility: Sit to Supine       Sit to supine: Modified independent (Device/Increase time)        Transfers Overall transfer level: Needs assistance Equipment used: Rolling walker (2 wheels) Transfers: Sit to/from Stand Sit to Stand: Min guard           General transfer comment: counts to three then stands, requires cues for hand placement    Ambulation/Gait Ambulation/Gait assistance: Min guard Gait Distance (Feet): 40  Feet Assistive device: Rolling walker (2 wheels) Gait Pattern/deviations: Step-through pattern, Decreased step length - right, Decreased step length - left, Decreased stride length Gait velocity: decr     General Gait Details: improved ambulation using RW. No scissioring or significant narrow base of support noted. Cues needed to stay close to RW.   Stairs             Wheelchair Mobility    Modified Rankin (Stroke Patients Only)       Balance Overall balance assessment: Needs assistance Sitting-balance support: Feet supported Sitting balance-Leahy Scale: Good     Standing balance support: Bilateral upper extremity supported, During functional activity, Reliant on assistive device for balance Standing balance-Leahy Scale: Fair                              Cognition Arousal/Alertness: Awake/alert Behavior During Therapy: Flat affect Overall Cognitive Status: No family/caregiver present to determine baseline cognitive functioning                                 General Comments: more alert from prior session. Able to follow direction/cues with repitition        Exercises      General Comments        Pertinent Vitals/Pain Pain Assessment Pain Assessment: No/denies pain    Home Living                          Prior Function  PT Goals (current goals can now be found in the care plan section) Acute Rehab PT Goals Patient Stated Goal: to get back home PT Goal Formulation: With patient Time For Goal Achievement: 05/28/22 Potential to Achieve Goals: Fair Progress towards PT goals: Progressing toward goals    Frequency    Min 3X/week      PT Plan Discharge plan needs to be updated    Co-evaluation              AM-PAC PT "6 Clicks" Mobility   Outcome Measure  Help needed turning from your back to your side while in a flat bed without using bedrails?: None Help needed moving from lying on your  back to sitting on the side of a flat bed without using bedrails?: None Help needed moving to and from a bed to a chair (including a wheelchair)?: A Little Help needed standing up from a chair using your arms (e.g., wheelchair or bedside chair)?: A Little Help needed to walk in hospital room?: A Little Help needed climbing 3-5 steps with a railing? : A Lot 6 Click Score: 19    End of Session Equipment Utilized During Treatment: Gait belt Activity Tolerance: Patient tolerated treatment well Patient left: in bed;with call bell/phone within reach;with bed alarm set;with family/visitor present Nurse Communication: Mobility status PT Visit Diagnosis: Muscle weakness (generalized) (M62.81);Difficulty in walking, not elsewhere classified (R26.2)     Time: [id number] PT Time Calculation (min) (ACUTE ONLY): 13 min  Charges:  $Gait Training: 8-22 mins                     Timmy Cleverly, PT, GCS 05/16/22,2:54 PM

## 2022-05-16 NOTE — Progress Notes (Signed)
Occupational Therapy Treatment Patient Details Name: Brittany [name] MRN: [id number] DOB: 1960/12/23 Today's Date: 05/16/2022   History of present illness presented to ER and admitted for acute hospitalization secondary to decreased responsiveness, hypoglycemia   OT comments  [name] was seen for OT treatment on this date. Upon arrival to room pt reclined in bed, agreeable to tx. Pt requires CGA + RW toilet t/f and pericare sitting, cues for safe RW technique. SETUP tooth brushing standing sink side. MIN cues to order dinner. Pt making good progress toward goals, will continue to follow POC. Discharge recommendation remains appropriate.     Recommendations for follow up therapy are one component of a multi-disciplinary discharge planning process, led by the attending physician.  Recommendations may be updated based on patient status, additional functional criteria and insurance authorization.    Assistance Recommended at Discharge Frequent or constant Supervision/Assistance  Patient can return home with the following  A lot of help with walking and/or transfers;A lot of help with bathing/dressing/bathroom;Help with stairs or ramp for entrance   Equipment Recommendations  Other (comment)    Recommendations for Other Services      Precautions / Restrictions Precautions Precautions: Fall Precaution Comments: L LQ ostomy Restrictions Weight Bearing Restrictions: No       Mobility Bed Mobility Overal bed mobility: Modified Independent                  Transfers Overall transfer level: Needs assistance Equipment used: Rolling walker (2 wheels) Transfers: Sit to/from Stand Sit to Stand: Min guard                 Balance Overall balance assessment: Needs assistance Sitting-balance support: Feet supported Sitting balance-Leahy Scale: Good     Standing balance support: No upper extremity supported, During functional activity Standing balance-Leahy Scale: Fair                              ADL either performed or assessed with clinical judgement   ADL Overall ADL's : Needs assistance/impaired                                       General ADL Comments: CGA + RW toilet t/f and pericare sitting, cues for safe RW technique. SETUP tooth brushing standing sink side.      Cognition Arousal/Alertness: Awake/alert Behavior During Therapy: Flat affect Overall Cognitive Status: No family/caregiver present to determine baseline cognitive functioning                                 General Comments: more alert from prior session. Able to follow direction/cues with repitition                   Pertinent Vitals/ Pain       Pain Assessment Pain Assessment: No/denies pain   Frequency  Min 2X/week        Progress Toward Goals  OT Goals(current goals can now be found in the care plan section)  Progress towards OT goals: Progressing toward goals  Acute Rehab OT Goals Patient Stated Goal: to walk better OT Goal Formulation: With patient Time For Goal Achievement: 05/29/22 Potential to Achieve Goals: Good ADL Goals Pt Will Perform Grooming: with supervision;standing Pt Will Perform Lower Body Dressing: with modified  independence;sit to/from stand Pt Will Transfer to Toilet: with supervision;ambulating;regular height toilet  Plan Discharge plan remains appropriate;Frequency remains appropriate    Co-evaluation                 AM-PAC OT "6 Clicks" Daily Activity     Outcome Measure   Help from another person eating meals?: None Help from another person taking care of personal grooming?: A Little Help from another person toileting, which includes using toliet, bedpan, or urinal?: A Lot Help from another person bathing (including washing, rinsing, drying)?: A Lot Help from another person to put on and taking off regular upper body clothing?: A Little Help from another person to put on and  taking off regular lower body clothing?: A Lot 6 Click Score: 16    End of Session Equipment Utilized During Treatment: Rolling walker (2 wheels)  OT Visit Diagnosis: Other abnormalities of gait and mobility (R26.89);Muscle weakness (generalized) (M62.81)   Activity Tolerance Patient tolerated treatment well   Patient Left with call bell/phone within reach;in bed;with bed alarm set   Nurse Communication          Time: [id number] OT Time Calculation (min): 17 min  Charges: OT General Charges $OT Visit: 1 Visit OT Treatments $Self Care/Home Management : 8-22 mins  Dessie Coma, M.S. OTR/L  05/16/22, 3:27 PM  ascom [phone]

## 2022-05-16 NOTE — Plan of Care (Signed)

## 2022-05-16 NOTE — Progress Notes (Signed)
PROGRESS NOTE    Brittany [name]  [id number] DOB: June 04, 1960 DOA: 05/13/2022 PCP: [name], NP    Brief Narrative:  62 year old female with labile diabetes, COPD, hyperkalemia, hyponatremia, lupus, pancreatic mass. Patient coming in with hypoglycemia and hypothermia. Stool for C. difficile positive. Started on Dificid.    Assessment & Plan:   Principal Problem:   Diabetes mellitus, labile Active Problems:   Recurrent colitis due to Clostridium difficile   Hyponatremia   Hypothermia   Hyperkalemia   AKI (acute kidney injury)   Orthostatic hypotension   Chronic diastolic CHF (congestive heart failure)   Seizure   Depression with anxiety   COPD (chronic obstructive pulmonary disease)   Tobacco dependence   Pancreatic mass   Dementia without behavioral disturbance   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  * Recurrent colitis due to Clostridium difficile Patient came in with hypothermia and had a quite a bit of diarrhea and her colostomy.  Treated with vancomycin back in January.  Will switch to Dificid 10-day course.   Hyperkalemia Continue Lokelma   Diabetes mellitus, labile Right labile blood sugars.  Diabetes coordinator following.  Appreciate recommendations and dosing assistance.   Hyponatremia Continue fluids today.  Will likely discontinue 4/4   AKI (acute kidney injury) Creatinine 1.38 on presentation.  Subsequently downtrending.  On low rate IV fluids.  Will continue for today.  Consider discontinuation 4/4.       Hypothermia Could be secondary to C. difficile infection.  Resolved   Orthostatic hypotension Patient on Florinef   Chronic diastolic CHF (congestive heart failure) No signs of heart failure.  Watch closely with IV fluids.   Seizure On Depakote   Depression with anxiety Continue Remeron and Wellbutrin   COPD (chronic obstructive pulmonary disease) Respiratory status stable   Tobacco dependence Nicotine  patch.    Dementia without behavioral disturbance Patient is currently on Seroquel, Remeron, Depakote.  Patient is also on Wellbutrin.   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   DVT prophylaxis: SQ heparin Code Status: FULL Family Communication:None Disposition Plan: Status is: Inpatient Remains inpatient appropriate because: Unsafe dc plan.  Recurrent C dif   Level of care: Med-Surg  Consultants:  None  Procedures:  None  Antimicrobials: Dificid    Subjective: Seen and examined.  Appears chronically ill but overall no acute distress.  Perseverates on food.  Objective: Vitals:   05/15/22 2034 05/16/22 0546 05/16/22 0753 05/16/22 0901  BP: 103/75 108/75 111/77   Pulse: 86 (!) 109 73   Resp: 17 18 16    Temp: 98.3 F (36.8 C) 97.8 F (36.6 C) 98.4 F (36.9 C)   TempSrc: Oral Oral Oral   SpO2: 100% 96% 98%   Weight:      Height:    5\' 4"  (1.626 m)    Intake/Output Summary (Last 24 hours) at 05/16/2022 1147 Last data filed at 05/16/2022 1100 Gross per 24 hour  Intake 542 ml  Output 1550 ml  Net -1008 ml   Filed Weights   05/13/22 1311 05/15/22 0500  Weight: 40.8 kg 40.8 kg    Examination:  General exam:  no acute distress.  Appears chronically ill Respiratory system: Poor respiratory effort.  Normal work of breathing.  Room air Cardiovascular system: S1-S2, RRR, no murmurs, no pedal edema Gastrointestinal system: Soft, NT/ND, hyperactive bowel sounds, ostomy with soft stool Central nervous system:  Alert and oriented. No focal neurological deficits. Extremities: Decreased power Skin: No rashes, lesions or ulcers Psychiatry: Judgement and insight appear impaired. Mood & affect flattened.     Data Reviewed: I have personally reviewed following labs and imaging studies  CBC: Recent Labs  Lab 05/13/22 1415 05/14/22 0532 05/15/22 0713  WBC 9.2 6.0 7.2   NEUTROABS 5.4  --   --   HGB 12.2 10.2* 10.0*  HCT 38.6 32.3* 30.3*  MCV 94.8 94.7 91.0  PLT 347 237 [id number]   Basic Metabolic Panel: Recent Labs  Lab 05/13/22 1610 05/14/22 0340 05/15/22 0713  NA 138 134* 139  K 5.0 5.5* 5.2*  CL 108 105 111  CO2 16* 22 23  GLUCOSE 105* 422* 331*  BUN 33* 33* 25*  CREATININE 1.38* 1.23* 1.07*  CALCIUM 8.7* 8.1* 8.4*   GFR: Estimated Creatinine Clearance: 35.1 mL/min (A) (by C-G formula based on SCr of 1.07 mg/dL (H)). Liver Function Tests: Recent Labs  Lab 05/14/22 0340  AST 49*  ALT 32  ALKPHOS 122  BILITOT 0.5  PROT 6.7  ALBUMIN 2.4*   No results for input(s): "LIPASE", "AMYLASE" in the last 168 hours. No results for input(s): "AMMONIA" in the last 168 hours. Coagulation Profile: No results for input(s): "INR", "PROTIME" in the last 168 hours. Cardiac Enzymes: No results for input(s): "CKTOTAL", "CKMB", "CKMBINDEX", "TROPONINI" in the last 168 hours. BNP (last 3 results) No results for input(s): "PROBNP" in the last 8760 hours. HbA1C: No results for input(s): "HGBA1C" in the last 72 hours. CBG: Recent Labs  Lab 05/15/22 1244 05/15/22 1550 05/15/22 1713 05/15/22 2035 05/16/22 0755  GLUCAP 136* 313* 287* 85 202*   Lipid Profile: No results for input(s): "CHOL", "HDL", "LDLCALC", "TRIG", "CHOLHDL", "LDLDIRECT" in the last 72 hours. Thyroid Function Tests: No results for input(s): "TSH", "T4TOTAL", "FREET4", "T3FREE", "THYROIDAB" in the last 72 hours. Anemia Panel: No results for input(s): "VITAMINB12", "FOLATE", "FERRITIN", "TIBC", "IRON", "RETICCTPCT" in the last 72 hours. Sepsis Labs: No results for input(s): "PROCALCITON", "LATICACIDVEN" in the last 168 hours.  Recent Results (from the past 240 hour(s))  Gastrointestinal Panel by PCR , Stool     Status: None   Collection Time: 05/14/22  8:51 AM   Specimen: Stool  Result Value Ref Range Status   Campylobacter species NOT DETECTED NOT DETECTED Final   Plesimonas  shigelloides NOT DETECTED NOT DETECTED Final   Salmonella species NOT DETECTED NOT DETECTED Final   Yersinia enterocolitica NOT DETECTED NOT DETECTED Final   Vibrio species NOT DETECTED NOT DETECTED Final   Vibrio cholerae NOT DETECTED NOT DETECTED Final   Enteroaggregative E coli (EAEC) NOT DETECTED NOT DETECTED Final   Enteropathogenic E coli (EPEC) NOT DETECTED NOT DETECTED Final   Enterotoxigenic E coli (ETEC) NOT DETECTED NOT DETECTED Final   Shiga like toxin producing E coli (STEC) NOT DETECTED NOT DETECTED Final   Shigella/Enteroinvasive E coli (EIEC) NOT DETECTED NOT DETECTED Final   Cryptosporidium NOT DETECTED NOT DETECTED Final   Cyclospora cayetanensis NOT DETECTED NOT DETECTED Final   Entamoeba histolytica NOT DETECTED NOT DETECTED Final   Giardia lamblia NOT DETECTED NOT DETECTED Final   Adenovirus F40/41 NOT DETECTED NOT DETECTED Final   Astrovirus NOT DETECTED NOT DETECTED Final   Norovirus GI/GII NOT DETECTED NOT DETECTED Final   Rotavirus A NOT DETECTED NOT DETECTED Final   Sapovirus (I, II, IV, and V) NOT DETECTED NOT DETECTED Final    Comment: Performed at [facility], [address] [place], [place] [address]  C Difficile Quick Screen w PCR reflex     Status: Abnormal   Collection Time: 05/14/22  8:51 AM   Specimen: Stool  Result Value Ref Range Status   C Diff antigen POSITIVE (A) NEGATIVE Final   C Diff toxin NEGATIVE NEGATIVE Final   C Diff interpretation Results are indeterminate. See PCR results.  Final    Comment: Performed at [facility], [address] [place], [place] [address]  C. Diff by PCR, Reflexed     Status: Abnormal   Collection Time: 05/14/22  8:51 AM  Result Value Ref Range Status   Toxigenic C. Difficile by PCR POSITIVE (A) NEGATIVE Final    Comment: Positive for toxigenic C. difficile with little to no toxin production. Only treat if clinical presentation suggests symptomatic illness. Performed at [facility], [address], [place], [place] [address]          Radiology Studies: No results found.      Scheduled Meds:  aspirin EC  81 mg Oral Daily   atorvastatin  40 mg Oral QHS   buPROPion  150 mg Oral Daily   divalproex  500 mg Oral Q12H   feeding supplement (GLUCERNA SHAKE)  237 mL Oral TID BM   fidaxomicin  200 mg Oral BID   fludrocortisone  0.05 mg Oral Daily   heparin  5,000 Units Subcutaneous Q12H   insulin aspart  0-5 Units Subcutaneous QHS   insulin aspart  0-6 Units Subcutaneous TID WC   insulin detemir  7 Units Subcutaneous BID   mirtazapine  15 mg Oral QHS   multivitamin with minerals  1 tablet Oral Daily   QUEtiapine  150 mg Oral QHS   sodium chloride flush  3 mL Intravenous Q12H   sodium zirconium cyclosilicate  10 g Oral Daily   Continuous Infusions:  sodium chloride 40 mL/hr at 05/15/22 1248     LOS: 2 days     Sidney Ace, MD [facility]   If 7PM-7AM, please contact night-coverage  05/16/2022, 11:47 AM

## 2022-05-17 DIAGNOSIS — E109 Type 1 diabetes mellitus without complications: Secondary | ICD-10-CM | POA: Diagnosis not present

## 2022-05-17 LAB — CBC WITH DIFFERENTIAL/PLATELET
Abs Immature Granulocytes: 0.06 10*3/uL (ref 0.00–0.07)
Basophils Absolute: 0 10*3/uL (ref 0.0–0.1)
Basophils Relative: 0 %
Eosinophils Absolute: 0 10*3/uL (ref 0.0–0.5)
Eosinophils Relative: 0 %
HCT: 32.4 % — ABNORMAL LOW (ref 36.0–46.0)
Hemoglobin: 10.3 g/dL — ABNORMAL LOW (ref 12.0–15.0)
Immature Granulocytes: 1 %
Lymphocytes Relative: 55 %
Lymphs Abs: 5 10*3/uL — ABNORMAL HIGH (ref 0.7–4.0)
MCH: 29.4 pg (ref 26.0–34.0)
MCHC: 31.8 g/dL (ref 30.0–36.0)
MCV: 92.6 fL (ref 80.0–100.0)
Monocytes Absolute: 0.7 10*3/uL (ref 0.1–1.0)
Monocytes Relative: 8 %
Neutro Abs: 3.3 10*3/uL (ref 1.7–7.7)
Neutrophils Relative %: 36 %
Platelets: 271 10*3/uL (ref 150–400)
RBC: 3.5 MIL/uL — ABNORMAL LOW (ref 3.87–5.11)
RDW: 17.2 % — ABNORMAL HIGH (ref 11.5–15.5)
WBC: 9.2 10*3/uL (ref 4.0–10.5)
nRBC: 0.7 % — ABNORMAL HIGH (ref 0.0–0.2)

## 2022-05-17 LAB — BASIC METABOLIC PANEL
Anion gap: 5 (ref 5–15)
BUN: 28 mg/dL — ABNORMAL HIGH (ref 8–23)
CO2: 24 mmol/L (ref 22–32)
Calcium: 8.8 mg/dL — ABNORMAL LOW (ref 8.9–10.3)
Chloride: 111 mmol/L (ref 98–111)
Creatinine, Ser: 1.07 mg/dL — ABNORMAL HIGH (ref 0.44–1.00)
GFR, Estimated: 59 mL/min — ABNORMAL LOW (ref >60)
Glucose, Bld: 149 mg/dL — ABNORMAL HIGH (ref 70–99)
Potassium: 5.3 mmol/L — ABNORMAL HIGH (ref 3.5–5.1)
Sodium: 140 mmol/L (ref 135–145)

## 2022-05-17 LAB — GLUCOSE, CAPILLARY: Glucose-Capillary: 150 mg/dL — ABNORMAL HIGH (ref 70–99)

## 2022-05-17 MED ORDER — INSULIN DETEMIR 100 UNIT/ML ~~LOC~~ SOLN: 8.0000 [IU] | Freq: Two times a day (BID) | SUBCUTANEOUS | 11 refills | Status: DC

## 2022-05-17 MED ORDER — INSULIN DETEMIR 100 UNIT/ML ~~LOC~~ SOLN
8.0000 [IU] | Freq: Two times a day (BID) | SUBCUTANEOUS | Status: DC
  Administered 2022-05-17: 8 [IU] via SUBCUTANEOUS
  Filled 2022-05-17: qty 0.08

## 2022-05-17 MED ORDER — FIDAXOMICIN 200 MG PO TABS: 200.0000 mg | ORAL_TABLET | Freq: Two times a day (BID) | ORAL | 0 refills | Status: DC

## 2022-05-17 MED ORDER — MIRTAZAPINE 15 MG PO TABS: 15.0000 mg | ORAL_TABLET | Freq: Every day | ORAL | Status: DC

## 2022-05-17 MED ORDER — DIVALPROEX SODIUM 500 MG PO DR TAB: 500.0000 mg | DELAYED_RELEASE_TABLET | Freq: Two times a day (BID) | ORAL | Status: DC

## 2022-05-17 NOTE — Discharge Summary (Signed)
Physician Discharge Summary  Brittany [name] [id number] DOB: 07-13-1960 DOA: 05/13/2022  PCP: [name], NP  Admit date: 05/13/2022 Discharge date: 05/17/2022  Admitted From: SNF Disposition:  SNF  Recommendations for Outpatient Follow-up:  Follow up with PCP in 1-2 weeks   Home Health:No Equipment/Devices:None   Discharge Condition:Stable  CODE STATUS:FULL  Diet recommendation: Carb  Brief/Interim Summary:  62 year old female with labile diabetes, COPD, hyperkalemia, hyponatremia, lupus, pancreatic mass. Patient coming in with hypoglycemia and hypothermia. Stool for C. difficile positive. Started on Dificid.    Discharge Diagnoses:  Principal Problem:   Diabetes mellitus, labile Active Problems:   Recurrent colitis due to Clostridium difficile   Hyponatremia   Hypothermia   Hyperkalemia   AKI (acute kidney injury)   Orthostatic hypotension   Chronic diastolic CHF (congestive heart failure)   Seizure   Depression with anxiety   COPD (chronic obstructive pulmonary disease)   Tobacco dependence   Pancreatic mass   Dementia without behavioral disturbance   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  * Recurrent colitis due to Clostridium difficile Patient came in with hypothermia and had a quite a bit of diarrhea and her colostomy.  Treated with vancomycin back in January.  Will switch to Dificid 10-day course.  Last dose 4/10   Hyperkalemia Continue Lokelma   Diabetes mellitus, labile Right labile blood sugars.  Diabetes coordinator following.  Appreciate recommendations and dosing assistance.  At time of dc will recommend semglee 8U BID   Hyponatremia Resolved   AKI (acute kidney injury) Creatinine 1.38 on presentation.  Subsequently downtrending.  Kidney function baseline at time of dc   Hypothermia Could be secondary to C. difficile infection.  Resolved   Orthostatic hypotension Patient on Florinef   Chronic diastolic CHF  (congestive heart failure) No signs of decompensated heart failure.     Seizure On Depakote   Depression with anxiety Continue Remeron and Wellbutrin   COPD (chronic obstructive pulmonary disease) Respiratory status stable   Tobacco dependence Nicotine patch.    Dementia without behavioral disturbance Patient is currently on Seroquel, Remeron, Depakote.  Patient is also on Wellbutrin.   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  Discharge Instructions  Discharge Instructions     Diet Carb Modified   Complete by: As directed    Increase activity slowly   Complete by: As directed       Allergies as of 05/17/2022       Reactions   Cephalosporins Itching   TOLERATED ZOSYN (PIPERACILLIN) BEFORE   Hydromorphone Itching   Keflin [cephalothin] Itching   Lactose Intolerance (gi) Diarrhea        Medication List     STOP taking these medications    insulin aspart 100 UNIT/ML injection Commonly known as: novoLOG       TAKE these medications    aspirin EC 81 MG tablet Take 1 tablet (81 mg total) by mouth daily. Swallow whole.   atorvastatin 40 MG tablet Commonly known as: LIPITOR Take 1 tablet (40 mg total) by mouth Nightly. What changed: when to take this   buPROPion 150 MG 24 hr tablet Commonly known as: WELLBUTRIN XL Take 150 mg by mouth daily.   divalproex 500 MG DR tablet Commonly known as: DEPAKOTE Take 1 tablet (500 mg total) by mouth every 12 (twelve) hours.   feeding supplement (GLUCERNA SHAKE) Liqd Take 237 mLs  by mouth 3 (three) times daily between meals.   fidaxomicin 200 MG Tabs tablet Commonly known as: DIFICID Take 1 tablet (200 mg total) by mouth 2 (two) times daily for 6 days.   fludrocortisone 0.1 MG tablet Commonly known as: FLORINEF Take 0.5 tablets (0.05 mg total) by mouth daily.   furosemide 20 MG tablet Commonly known as: LASIX Take  1 tablet (20 mg total) by mouth daily.   glucose 4 GM chewable tablet Chew 1 tablet by mouth 3 (three) times daily as needed for low blood sugar.   insulin detemir 100 UNIT/ML injection Commonly known as: LEVEMIR Inject 0.08 mLs (8 Units total) into the skin 2 (two) times daily.   mirtazapine 15 MG tablet Commonly known as: REMERON Take 1 tablet (15 mg total) by mouth at bedtime.   multivitamin with minerals Tabs tablet Take 1 tablet by mouth daily.   QUEtiapine 50 MG Tb24 24 hr tablet Commonly known as: SEROQUEL XR Take 50 mg by mouth at bedtime.   sodium zirconium cyclosilicate 10 g Pack packet Commonly known as: LOKELMA Take 10 g by mouth daily.        Contact information for after-discharge care     [facility] .   Service: Skilled Nursing Contact information: [address] [address] [phone]                    Allergies  Allergen Reactions   Cephalosporins Itching    TOLERATED ZOSYN (PIPERACILLIN) BEFORE   Hydromorphone Itching   Keflin [Cephalothin] Itching   Lactose Intolerance (Gi) Diarrhea    Consultations: None   Procedures/Studies: CT HEAD WO CONTRAST (5MM)  Result Date: 05/13/2022 CLINICAL DATA:  Hypothermia. EXAM: CT HEAD WITHOUT CONTRAST TECHNIQUE: Contiguous axial images were obtained from the base of the skull through the vertex without intravenous contrast. RADIATION DOSE REDUCTION: This exam was performed according to the departmental dose-optimization program which includes automated exposure control, adjustment of the mA and/or kV according to patient size and/or use of iterative reconstruction technique. COMPARISON:  Head CT dated 02/09/2022. 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 There is laminar necrosis in the left occipital lobe. There is mild age-related atrophy and moderate chronic  microvascular ischemic changes. There is no acute intracranial hemorrhage. No mass effect or midline shift. No extra-axial fluid collection. Vascular: No hyperdense vessel or unexpected calcification. Skull: Normal. Negative for fracture or focal lesion. Sinuses/Orbits: No acute finding. Other: None IMPRESSION: 1. No acute intracranial pathology. 2. 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 3. Mild age-related atrophy and moderate chronic microvascular ischemic changes. Electronically Signed   By: Anner Crete M.D.   On: 05/13/2022 23:02   CT HIP RIGHT WO CONTRAST  Result Date: 05/13/2022 CLINICAL DATA:  Fall and right hip pain. EXAM: CT OF THE RIGHT HIP WITHOUT CONTRAST TECHNIQUE: Multidetector CT imaging of the right hip was performed according to the standard protocol. Multiplanar CT image reconstructions were also generated. RADIATION DOSE REDUCTION: This exam was performed according to the departmental dose-optimization program which includes automated exposure control, adjustment of the mA and/or kV according to patient size and/or use of iterative reconstruction technique. COMPARISON:  None Available. FINDINGS: Bones/Joint/Cartilage There is no acute fracture or dislocation. The bones are osteopenic. No joint effusion Ligaments Suboptimally assessed by CT. Muscles and Tendons No acute  finding.  No intramuscular fluid collection. Soft tissues Postsurgical changes of the bowel with anastomotic suture in the pelvis. IMPRESSION: No acute fracture or dislocation. Electronically Signed   By: Anner Crete M.D.   On: 05/13/2022 22:29      Subjective: Seen and examined on day of dc.  Stable, appropriate for DC to SNF  Discharge Exam: Vitals:   05/16/22 2134 05/17/22 0814  BP: 107/81 (!) 89/65  Pulse: 92 80  Resp:  16  Temp:  (!) 97.5 F (36.4 C)  SpO2: 98% 98%   Vitals:   05/16/22 2048 05/16/22 2134 05/17/22 0500 05/17/22  0814  BP: 98/72 107/81  (!) 89/65  Pulse: 84 92  80  Resp: 18   16  Temp: 98.1 F (36.7 C)   (!) 97.5 F (36.4 C)  TempSrc: Oral   Oral  SpO2: 98% 98%  98%  Weight:   51.7 kg   Height:        General: Pt is alert, awake, not in acute distress Cardiovascular: RRR, S1/S2 +, no rubs, no gallops Respiratory: CTA bilaterally, no wheezing, no rhonchi Abdominal: Soft, NT, ND, bowel sounds + Extremities: no edema, no cyanosis    The results of significant diagnostics from this hospitalization (including imaging, microbiology, ancillary and laboratory) are listed below for reference.     Microbiology: Recent Results (from the past 240 hour(s))  Gastrointestinal Panel by PCR , Stool     Status: None   Collection Time: 05/14/22  8:51 AM   Specimen: Stool  Result Value Ref Range Status   Campylobacter species NOT DETECTED NOT DETECTED Final   Plesimonas shigelloides NOT DETECTED NOT DETECTED Final   Salmonella species NOT DETECTED NOT DETECTED Final   Yersinia enterocolitica NOT DETECTED NOT DETECTED Final   Vibrio species NOT DETECTED NOT DETECTED Final   Vibrio cholerae NOT DETECTED NOT DETECTED Final   Enteroaggregative E coli (EAEC) NOT DETECTED NOT DETECTED Final   Enteropathogenic E coli (EPEC) NOT DETECTED NOT DETECTED Final   Enterotoxigenic E coli (ETEC) NOT DETECTED NOT DETECTED Final   Shiga like toxin producing E coli (STEC) NOT DETECTED NOT DETECTED Final   Shigella/Enteroinvasive E coli (EIEC) NOT DETECTED NOT DETECTED Final   Cryptosporidium NOT DETECTED NOT DETECTED Final   Cyclospora cayetanensis NOT DETECTED NOT DETECTED Final   Entamoeba histolytica NOT DETECTED NOT DETECTED Final   Giardia lamblia NOT DETECTED NOT DETECTED Final   Adenovirus F40/41 NOT DETECTED NOT DETECTED Final   Astrovirus NOT DETECTED NOT DETECTED Final   Norovirus GI/GII NOT DETECTED NOT DETECTED Final   Rotavirus A NOT DETECTED NOT DETECTED Final   Sapovirus (I, II, IV, and V) NOT  DETECTED NOT DETECTED Final    Comment: Performed at [facility], [address] [place], [place] [address]  C Difficile Quick Screen w PCR reflex     Status: Abnormal   Collection Time: 05/14/22  8:51 AM   Specimen: Stool  Result Value Ref Range Status   C Diff antigen POSITIVE (A) NEGATIVE Final   C Diff toxin NEGATIVE NEGATIVE Final   C Diff interpretation Results are indeterminate. See PCR results.  Final    Comment: Performed at [facility], [address] [place], [place] [address]  C. Diff by PCR, Reflexed     Status: Abnormal   Collection Time: 05/14/22  8:51 AM  Result Value Ref Range Status   Toxigenic C. Difficile by PCR POSITIVE (A) NEGATIVE Final    Comment:  Positive for toxigenic C. difficile with little to no toxin production. Only treat if clinical presentation suggests symptomatic illness. Performed at [facility], [address] [place], [place] [address]      Labs: BNP (last 3 results) Recent Labs    12/28/21 1521 02/09/22 1331  BNP 27.8 Q000111Q   Basic Metabolic Panel: Recent Labs  Lab 05/13/22 1610 05/14/22 0340 05/15/22 0713 05/17/22 0714  NA 138 134* 139 140  K 5.0 5.5* 5.2* 5.3*  CL 108 105 111 111  CO2 16* 22 23 24   GLUCOSE 105* 422* 331* 149*  BUN 33* 33* 25* 28*  CREATININE 1.38* 1.23* 1.07* 1.07*  CALCIUM 8.7* 8.1* 8.4* 8.8*   Liver Function Tests: Recent Labs  Lab 05/14/22 0340  AST 49*  ALT 32  ALKPHOS 122  BILITOT 0.5  PROT 6.7  ALBUMIN 2.4*   No results for input(s): "LIPASE", "AMYLASE" in the last 168 hours. No results for input(s): "AMMONIA" in the last 168 hours. CBC: Recent Labs  Lab 05/13/22 1415 05/14/22 0532 05/15/22 0713 05/17/22 0714  WBC 9.2 6.0 7.2 9.2  NEUTROABS 5.4  --   --  3.3  HGB 12.2 10.2* 10.0* 10.3*  HCT 38.6 32.3* 30.3* 32.4*  MCV 94.8 94.7 91.0 92.6  PLT 347 237 282 271   Cardiac Enzymes: No results for input(s): "CKTOTAL", "CKMB", "CKMBINDEX", "TROPONINI" in  the last 168 hours. BNP: Invalid input(s): "POCBNP" CBG: Recent Labs  Lab 05/16/22 1630 05/16/22 1807 05/16/22 1952 05/16/22 2050 05/17/22 0706  GLUCAP 405* 385* 294* 222* 150*   D-Dimer No results for input(s): "DDIMER" in the last 72 hours. Hgb A1c No results for input(s): "HGBA1C" in the last 72 hours. Lipid Profile No results for input(s): "CHOL", "HDL", "LDLCALC", "TRIG", "CHOLHDL", "LDLDIRECT" in the last 72 hours. Thyroid function studies No results for input(s): "TSH", "T4TOTAL", "T3FREE", "THYROIDAB" in the last 72 hours.  Invalid input(s): "FREET3" Anemia work up No results for input(s): "VITAMINB12", "FOLATE", "FERRITIN", "TIBC", "IRON", "RETICCTPCT" in the last 72 hours. Urinalysis    Component Value Date/Time   COLORURINE YELLOW (A) 05/13/2022 1328   APPEARANCEUR HAZY (A) 05/13/2022 1328   APPEARANCEUR Hazy 04/04/2011 1935   LABSPEC 1.011 05/13/2022 1328   LABSPEC >1.060 04/04/2011 1935   PHURINE 5.0 05/13/2022 1328   GLUCOSEU NEGATIVE 05/13/2022 1328   GLUCOSEU 50 mg/dL 04/04/2011 1935   HGBUR MODERATE (A) 05/13/2022 1328   BILIRUBINUR NEGATIVE 05/13/2022 1328   BILIRUBINUR Negative 04/04/2011 1935   KETONESUR NEGATIVE 05/13/2022 1328   PROTEINUR NEGATIVE 05/13/2022 1328   NITRITE NEGATIVE 05/13/2022 1328   LEUKOCYTESUR NEGATIVE 05/13/2022 1328   LEUKOCYTESUR Negative 04/04/2011 1935   Sepsis Labs Recent Labs  Lab 05/13/22 1415 05/14/22 0532 05/15/22 0713 05/17/22 0714  WBC 9.2 6.0 7.2 9.2   Microbiology Recent Results (from the past 240 hour(s))  Gastrointestinal Panel by PCR , Stool     Status: None   Collection Time: 05/14/22  8:51 AM   Specimen: Stool  Result Value Ref Range Status   Campylobacter species NOT DETECTED NOT DETECTED Final   Plesimonas shigelloides NOT DETECTED NOT DETECTED Final   Salmonella species NOT DETECTED NOT DETECTED Final   Yersinia enterocolitica NOT DETECTED NOT DETECTED Final   Vibrio species NOT DETECTED NOT  DETECTED Final   Vibrio cholerae NOT DETECTED NOT DETECTED Final   Enteroaggregative E coli (EAEC) NOT DETECTED NOT DETECTED Final   Enteropathogenic E coli (EPEC) NOT DETECTED NOT DETECTED Final   Enterotoxigenic E coli (  ETEC) NOT DETECTED NOT DETECTED Final   Shiga like toxin producing E coli (STEC) NOT DETECTED NOT DETECTED Final   Shigella/Enteroinvasive E coli (EIEC) NOT DETECTED NOT DETECTED Final   Cryptosporidium NOT DETECTED NOT DETECTED Final   Cyclospora cayetanensis NOT DETECTED NOT DETECTED Final   Entamoeba histolytica NOT DETECTED NOT DETECTED Final   Giardia lamblia NOT DETECTED NOT DETECTED Final   Adenovirus F40/41 NOT DETECTED NOT DETECTED Final   Astrovirus NOT DETECTED NOT DETECTED Final   Norovirus GI/GII NOT DETECTED NOT DETECTED Final   Rotavirus A NOT DETECTED NOT DETECTED Final   Sapovirus (I, II, IV, and V) NOT DETECTED NOT DETECTED Final    Comment: Performed at [facility], [address] [place], [place] [address]  C Difficile Quick Screen w PCR reflex     Status: Abnormal   Collection Time: 05/14/22  8:51 AM   Specimen: Stool  Result Value Ref Range Status   C Diff antigen POSITIVE (A) NEGATIVE Final   C Diff toxin NEGATIVE NEGATIVE Final   C Diff interpretation Results are indeterminate. See PCR results.  Final    Comment: Performed at [facility], [address] [place], [place] [address]  C. Diff by PCR, Reflexed     Status: Abnormal   Collection Time: 05/14/22  8:51 AM  Result Value Ref Range Status   Toxigenic C. Difficile by PCR POSITIVE (A) NEGATIVE Final    Comment: Positive for toxigenic C. difficile with little to no toxin production. Only treat if clinical presentation suggests symptomatic illness. Performed at [facility], [address], [place], [place] [address]      Time coordinating discharge: Over 30 minutes  SIGNED:   Sidney Ace, MD  [facility] 05/17/2022, 9:00 AM Pager   If  7PM-7AM, please contact night-coverage

## 2022-05-17 NOTE — Progress Notes (Signed)
VAST consult received to obtain new IV access. Due to possible discharge today, VAST RN reached out to Priscella Mann, MD via SecureChat to verify need for IV access. MD stated no need for IV access at this time.

## 2022-05-17 NOTE — Final Progress Note (Signed)
Pts received all morning medication prior to leaving pt ate breakfast and IV ws removed from Right forearm. Pt was sent via EMS to [facility]. I called twice to give report @ [phone]. I left 2 voicemail's and spoke with admissions directly.

## 2022-05-17 NOTE — TOC Transition Note (Signed)
Transition of Care [facility]) - CM/SW Discharge Note   Patient Details  Name: Brittany [name] MRN: [id number] Date of Birth: 1960-05-28  [facility]) CM/SW Contact:  Gerilyn Pilgrim, LCSW Phone Number: 05/17/2022, 9:22 AM   Clinical Narrative:   Pt has orders to discharge to [facility]. DC summary sent to facility. [facility] arranged patient is second list. Guardian at [facility] notified and Altha Harm at [facility] notified. RN given number for report. CSW signing off.           Patient Goals and CMS Choice      Discharge Placement                         Discharge Plan and Services Additional resources added to the After Visit Summary for                                       Social Determinants of Health (SDOH) Interventions SDOH Screenings   Food Insecurity: No Food Insecurity (05/14/2022)  Housing: Low Risk  (05/14/2022)  Transportation Needs: No Transportation Needs (05/14/2022)  Utilities: Not At Risk (05/14/2022)  Tobacco Use: High Risk (05/14/2022)     Readmission Risk Interventions    12/29/2021    1:48 PM 09/22/2021    1:24 PM 03/06/2021    9:13 AM  Readmission Risk Prevention Plan  Transportation Screening Complete Complete Complete  Medication Review Press photographer) Complete Complete Complete  PCP or Specialist appointment within 3-5 days of discharge  Complete Complete  HRI or Home Care Consult Complete Complete   SW Recovery Care/Counseling Consult Complete Complete   Palliative Care Screening Not Applicable Not Applicable Not [place] Not Applicable Complete Not Applicable

## 2022-05-21 ENCOUNTER — Ambulatory Visit (INDEPENDENT_AMBULATORY_CARE_PROVIDER_SITE_OTHER): Payer: Medicare Other | Admitting: Surgery

## 2022-05-21 ENCOUNTER — Encounter: Payer: Self-pay | Admitting: Surgery

## 2022-05-21 VITALS — BP 101/70 | HR 91 | Temp 98.2°F | Ht 60.0 in | Wt 119.6 lb

## 2022-05-21 DIAGNOSIS — E46 Unspecified protein-calorie malnutrition: Secondary | ICD-10-CM

## 2022-05-21 DIAGNOSIS — Z433 Encounter for attention to colostomy: Secondary | ICD-10-CM | POA: Diagnosis not present

## 2022-05-21 DIAGNOSIS — K5720 Diverticulitis of large intestine with perforation and abscess without bleeding: Secondary | ICD-10-CM

## 2022-05-21 NOTE — Patient Instructions (Signed)
If you have any concerns or questions, please feel free to call our office. Follow up as needed.   Minimally Invasive Total Colectomy, Adult, Care After The following information offers guidance on how to care for yourself after your procedure. Your health care provider may also give you more specific instructions. If you have problems or questions, contact your health care provider. What can I expect after the surgery? After your procedure, it is common to have: Pain, bruising, and swelling. Bloating. Weakness and tiredness (fatigue). Changes to your bowel movements, especially having bowel movements more often. Follow these instructions at home: Medicines Take over-the-counter and prescription medicines only as told by your health care provider. If you were prescribed an antibiotic medicine, take it as told by your health care provider. Do not stop using the antibiotic even if you start to feel better. Ask your health care provider if the medicine prescribed to you: Requires you to avoid driving or using machinery. Can cause constipation. You may need to take these actions to prevent or treat constipation: Drink enough fluids to keep your urine pale yellow. Take over-the-counter or prescription medicines. Limit foods that are high in fat and processed sugars, such as fried or sweet foods. Eating and drinking Follow instructions from your health care provider about what you may eat and drink. Do not drink alcohol if your health care provider tells you not to drink. Eat a low-fiber diet for the first 4 weeks after surgery or as told by your health care provider. Most people on a low-fiber eating plan should eat less than 10 grams (g) of fiber a day. Follow recommendations from your health care provider or dietitian about how much fiber you should have each day. Always check food labels to know the fiber content of packaged foods. In general, a low-fiber food will have fewer than 2 g of fiber  per serving. In general, try to avoid whole grains, raw fruits and vegetables, dried fruit, tough cuts of meat, nuts, and seeds. Incision care  Follow instructions from your health care provider about how to take care of your incisions. Make sure you: Wash your hands with soap and water for at least 20 seconds before and after you change your bandage (dressing). If soap and water are not available, use hand sanitizer. Change your dressing as told by your health care provider. Leave stitches (sutures), skin glue, or adhesive strips in place. These skin closures may need to stay in place for 2 weeks or longer. If adhesive strip edges start to loosen and curl up, you may trim the loose edges. Do not remove adhesive strips completely unless your health care provider tells you to do that. Keep your incisions clean and dry. Check your incision area every day for signs of infection. Check for: More redness, swelling, or pain. Fluid or blood. Warmth. Pus or a bad smell. Activity Rest as told by your health care provider. Avoid sitting for a long time without moving. Get up to take short walks every 1-2 hours. This is important to improve blood flow and breathing. Ask for help if you feel weak or unsteady. You may have to avoid lifting. Ask your health care provider how much you can safely lift. Return to your normal activities as told by your health care provider. Ask your health care provider what activities are safe for you. General instructions Do not use any products that contain nicotine or tobacco. These products include cigarettes, chewing tobacco, and vaping devices, such as  e-cigarettes. If you need help quitting, ask your health care provider. Do not take baths, swim, or use a hot tub until your health care provider approves. Ask your health care provider if you may take showers. You may only be allowed to take sponge baths. Wear compression stockings as told by your health care provider.  These stockings help to prevent blood clots and reduce swelling in your legs. Keep all follow-up visits. This is important to monitor healing and check for any complications. Contact a health care provider if: Medicine is not controlling your pain. You have chills or fever. You have any signs of infection in your incision areas. You have a persistent cough. You have nausea or vomiting. You develop a rash. You have not had a bowel movement in 3 days. Get help right away if: You have severe pain. Your incisions break open after sutures or staples have been removed. You are bleeding from your rectum or have blood in your stool. You have a warm, tender swelling in your leg. You have chest pain or trouble breathing. You have increased swelling in the abdomen. You feel light-headed or you faint. These symptoms may be an emergency. Get help right away. Call 911. Do not wait to see if the symptoms will go away. Do not drive yourself to the hospital. Summary After surgery, it is common to have pain, bruising, swelling, bloating, tiredness, weakness, or changes in bowel movements. Follow instructions from your health care provider about how to care for your incisions. Contact a health care provider if you have any signs of infection in your incision areas. Get help right away if you have chest pain or trouble breathing. This information is not intended to replace advice given to you by your health care provider. Make sure you discuss any questions you have with your health care provider. Document Revised: 05/17/2021 Document Reviewed: 05/17/2021 Elsevier Patient Education  2023 [employer].

## 2022-05-23 NOTE — Progress Notes (Unsigned)
Outpatient Surgical Follow Up  05/23/2022  Brittany [name] is an 62 y.o. female.   Chief Complaint  Patient presents with   Follow-up    HPI: Brittany [name] is a 62 year old female well-known to me with history of dementia and schizophrenia I did a Hartman's procedure several months ago she now comes for evaluation for colostomy takedown.  She does have some psychotic episodes and lives in an assisted living facility and requires significant attention.  The colostomy is working well.  No fevers no chills no abdominal pain.  She is walking and back to baseline. He is smokes daily.  She does have significant issues including lupus, hypertension, diabetes, COPD, history of a stroke, seizure disorder.  Past Medical History:  Diagnosis Date   C. 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 She has been exposed to tobacco smoke. She has never used smokeless tobacco. She  reports that she does not currently use alcohol. She reports that she does not currently use drugs after having used the following drugs: Marijuana.  Allergies:  Allergies  Allergen Reactions   Cephalosporins Itching    TOLERATED ZOSYN (PIPERACILLIN) BEFORE   Hydromorphone Itching   Keflin [Cephalothin] Itching   Lactose Intolerance (Gi) Diarrhea    Medications reviewed.    ROS Full ROS performed and is otherwise negative other than what is stated in HPI   BP 101/70   Pulse 91   Temp 98.2 F (36.8 C)   Ht 5' (1.524 m)   Wt 119 lb 9.6 oz (54.3 kg)   SpO2 97%   BMI 23.36 kg/m   Physical Exam Vitals and nursing note reviewed. Exam conducted with a chaperone present.  Constitutional:      Appearance: Normal appearance. She is not ill-appearing.  Cardiovascular:     Rate and Rhythm: Normal rate and regular rhythm.  Pulmonary:     Effort: Pulmonary effort is normal. No respiratory distress.     Breath sounds: Normal breath sounds. No stridor.  Abdominal:     General: Abdomen is flat. There is no distension.     Palpations: Abdomen is soft. There is no mass.     Tenderness: There is no abdominal tenderness.     Hernia: No hernia is present.     Comments: Ostomy working and widely patent and viable  Musculoskeletal:     Cervical back: Normal  range of motion and neck supple. No rigidity or tenderness.  Skin:    General: Skin is warm and dry.     Capillary Refill: Capillary refill takes less than 2 seconds.  Neurological:     General: No focal deficit present.     Mental Status: She is alert and oriented to person, place, and time.  Psychiatric:        Mood and Affect: Mood normal.        Behavior: Behavior normal.      Assessment/Plan: Patient is 62 year old female status post Hartman's for perforated diverticulitis several months ago.  She is surprisingly recovered and tolerated the procedure well.  Given her malnutrition, severe psychiatric history I do not think  it is prudent to submit her to an elective surgery.  She does have significant risk factors for potential complications.  I had an extensive discussion with the patient and she was very appreciative.  She understands that she is at high risk.  I offered her a second opinion.  She is now okay with not having this colostomy reversed.  I spent 40 minutes in this encounter including personally reviewing imaging studies, coordinating her care, placing orders and performing appropriate documentation.   Sterling Big, MD [facility] General Surgeon

## 2022-06-06 ENCOUNTER — Emergency Department (HOSPITAL_COMMUNITY): Payer: Medicare Other

## 2022-06-06 ENCOUNTER — Encounter (HOSPITAL_COMMUNITY): Payer: Self-pay

## 2022-06-06 ENCOUNTER — Emergency Department (HOSPITAL_COMMUNITY)
Admission: EM | Admit: 2022-06-06 | Discharge: 2022-06-06 | Disposition: A | Discharge status: Home / Self Care | Payer: Medicare Other | Attending: Student | Admitting: Student

## 2022-06-06 ENCOUNTER — Other Ambulatory Visit: Payer: Self-pay

## 2022-06-06 DIAGNOSIS — M79622 Pain in left upper arm: Secondary | ICD-10-CM | POA: Insufficient documentation

## 2022-06-06 DIAGNOSIS — W06XXXA Fall from bed, initial encounter: Secondary | ICD-10-CM | POA: Insufficient documentation

## 2022-06-06 DIAGNOSIS — M545 Low back pain, unspecified: Secondary | ICD-10-CM | POA: Insufficient documentation

## 2022-06-06 DIAGNOSIS — Z8507 Personal history of malignant neoplasm of pancreas: Secondary | ICD-10-CM | POA: Insufficient documentation

## 2022-06-06 DIAGNOSIS — F17210 Nicotine dependence, cigarettes, uncomplicated: Secondary | ICD-10-CM | POA: Diagnosis not present

## 2022-06-06 DIAGNOSIS — J449 Chronic obstructive pulmonary disease, unspecified: Secondary | ICD-10-CM | POA: Diagnosis not present

## 2022-06-06 DIAGNOSIS — E119 Type 2 diabetes mellitus without complications: Secondary | ICD-10-CM | POA: Insufficient documentation

## 2022-06-06 DIAGNOSIS — I110 Hypertensive heart disease with heart failure: Secondary | ICD-10-CM | POA: Diagnosis not present

## 2022-06-06 DIAGNOSIS — I5032 Chronic diastolic (congestive) heart failure: Secondary | ICD-10-CM | POA: Insufficient documentation

## 2022-06-06 DIAGNOSIS — W19XXXA Unspecified fall, initial encounter: Secondary | ICD-10-CM

## 2022-06-06 DIAGNOSIS — Z8673 Personal history of transient ischemic attack (TIA), and cerebral infarction without residual deficits: Secondary | ICD-10-CM | POA: Insufficient documentation

## 2022-06-06 DIAGNOSIS — S0990XA Unspecified injury of head, initial encounter: Secondary | ICD-10-CM | POA: Insufficient documentation

## 2022-06-06 DIAGNOSIS — M25552 Pain in left hip: Secondary | ICD-10-CM | POA: Diagnosis present

## 2022-06-06 LAB — CBC WITH DIFFERENTIAL/PLATELET
Abs Immature Granulocytes: 0.03 10*3/uL (ref 0.00–0.07)
Basophils Absolute: 0 10*3/uL (ref 0.0–0.1)
Basophils Relative: 1 %
Eosinophils Absolute: 0 10*3/uL (ref 0.0–0.5)
Eosinophils Relative: 1 %
HCT: 34.9 % — ABNORMAL LOW (ref 36.0–46.0)
Hemoglobin: 10.8 g/dL — ABNORMAL LOW (ref 12.0–15.0)
Immature Granulocytes: 1 %
Lymphocytes Relative: 60 %
Lymphs Abs: 4 10*3/uL (ref 0.7–4.0)
MCH: 30.3 pg (ref 26.0–34.0)
MCHC: 30.9 g/dL (ref 30.0–36.0)
MCV: 98 fL (ref 80.0–100.0)
Monocytes Absolute: 0.4 10*3/uL (ref 0.1–1.0)
Monocytes Relative: 6 %
Neutro Abs: 2 10*3/uL (ref 1.7–7.7)
Neutrophils Relative %: 31 %
Platelets: 289 10*3/uL (ref 150–400)
RBC: 3.56 MIL/uL — ABNORMAL LOW (ref 3.87–5.11)
RDW: 18.3 % — ABNORMAL HIGH (ref 11.5–15.5)
WBC: 6.6 10*3/uL (ref 4.0–10.5)
nRBC: 0.3 % — ABNORMAL HIGH (ref 0.0–0.2)

## 2022-06-06 LAB — COMPREHENSIVE METABOLIC PANEL
ALT: 29 U/L (ref 0–44)
AST: 46 U/L — ABNORMAL HIGH (ref 15–41)
Albumin: 2.4 g/dL — ABNORMAL LOW (ref 3.5–5.0)
Alkaline Phosphatase: 92 U/L (ref 38–126)
Anion gap: 11 (ref 5–15)
BUN: 22 mg/dL (ref 8–23)
CO2: 20 mmol/L — ABNORMAL LOW (ref 22–32)
Calcium: 8.6 mg/dL — ABNORMAL LOW (ref 8.9–10.3)
Chloride: 106 mmol/L (ref 98–111)
Creatinine, Ser: 1.08 mg/dL — ABNORMAL HIGH (ref 0.44–1.00)
GFR, Estimated: 58 mL/min — ABNORMAL LOW (ref >60)
Glucose, Bld: 220 mg/dL — ABNORMAL HIGH (ref 70–99)
Potassium: 4.6 mmol/L (ref 3.5–5.1)
Sodium: 137 mmol/L (ref 135–145)
Total Bilirubin: 0.4 mg/dL (ref 0.3–1.2)
Total Protein: 7.1 g/dL (ref 6.5–8.1)
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
## 2022-06-06 NOTE — ED Triage Notes (Signed)
Patient brought in by [facility] from [place], reports fall at facility at unknown time. Patient reportedly getting out of bed tonight and fell hitting head and endorses left hip pain. Not on blood thinners. Patient also being treated for cdiff at this time.

## 2022-06-22 DIAGNOSIS — Z7982 Long term (current) use of aspirin: Secondary | ICD-10-CM | POA: Diagnosis not present

## 2022-06-22 DIAGNOSIS — R519 Headache, unspecified: Secondary | ICD-10-CM | POA: Diagnosis present

## 2022-06-22 DIAGNOSIS — W06XXXA Fall from bed, initial encounter: Secondary | ICD-10-CM | POA: Diagnosis not present

## 2022-06-22 DIAGNOSIS — F0390 Unspecified dementia without behavioral disturbance: Secondary | ICD-10-CM | POA: Insufficient documentation

## 2022-06-22 DIAGNOSIS — M542 Cervicalgia: Secondary | ICD-10-CM | POA: Insufficient documentation

## 2022-06-22 DIAGNOSIS — S0003XA Contusion of scalp, initial encounter: Secondary | ICD-10-CM | POA: Insufficient documentation

## 2022-06-23 ENCOUNTER — Other Ambulatory Visit: Payer: Self-pay

## 2022-06-23 ENCOUNTER — Emergency Department (HOSPITAL_COMMUNITY): Payer: Medicare Other

## 2022-06-23 ENCOUNTER — Emergency Department (HOSPITAL_COMMUNITY)
Admission: EM | Admit: 2022-06-23 | Discharge: 2022-06-23 | Disposition: A | Discharge status: Skilled Nursing Facility | Payer: Medicare Other | Attending: Emergency Medicine | Admitting: Emergency Medicine

## 2022-06-23 DIAGNOSIS — S0003XA Contusion of scalp, initial encounter: Secondary | ICD-10-CM

## 2022-06-23 DIAGNOSIS — S0990XA Unspecified injury of head, initial encounter: Secondary | ICD-10-CM

## 2022-06-23 NOTE — ED Notes (Signed)
[facility] transport setup for pt

## 2022-06-23 NOTE — ED Provider Notes (Signed)
[facility] EMERGENCY DEPARTMENT AT [facility] Provider Note   CSN: [id number] Arrival date & time: 06/22/22  2358     History  Chief Complaint  Patient presents with   Marletta Lor    Brittany [name] is a 62 y.o. female.  Patient sent to the emergency department for evaluation after a fall.  Patient reports falling from a seated position and hitting the right side of her head on the ground.  She complains of headache and mild neck pain.  She denies extremity pain and injury.  No back pain, pelvic pain, chest pain, abdominal pain.       Home Medications Prior to Admission medications   Medication Sig Start Date End Date Taking? Authorizing Provider  aspirin 81 MG EC tablet Take 1 tablet (81 mg total) by mouth daily. Swallow whole. 05/23/21   Pokhrel, Rebekah Chesterfield, MD  atorvastatin (LIPITOR) 40 MG tablet Take 1 tablet (40 mg total) by mouth Nightly. Patient taking differently: Take 40 mg by mouth at bedtime. 03/14/21 05/13/22  Enedina Finner, MD  buPROPion (WELLBUTRIN XL) 150 MG 24 hr tablet Take 150 mg by mouth daily.    [provider]  divalproex (DEPAKOTE) 500 MG DR tablet Take 1 tablet (500 mg total) by mouth every 12 (twelve) hours. Patient taking differently: Take 500 mg by mouth 3 (three) times daily. 05/17/22   Tresa Moore, MD  feeding supplement, GLUCERNA SHAKE, (GLUCERNA SHAKE) LIQD Take 237 mLs by mouth 3 (three) times daily between meals. 08/24/21   Enedina Finner, MD  fludrocortisone (FLORINEF) 0.1 MG tablet Take 0.5 tablets (0.05 mg total) by mouth daily. 02/23/22   Sunnie Nielsen, DO  furosemide (LASIX) 20 MG tablet Take 1 tablet (20 mg total) by mouth daily. 02/24/22   Sunnie Nielsen, DO  glucose 4 GM chewable tablet Chew 1 tablet by mouth 3 (three) times daily as needed for low blood sugar.    [provider]  insulin detemir (LEVEMIR) 100 UNIT/ML injection Inject 0.08 mLs (8 Units total) into the skin 2 (two) times daily. 05/17/22   Tresa Moore, MD  mirtazapine (REMERON) 15 MG tablet Take 1 tablet (15 mg total) by mouth at bedtime. 05/17/22   Tresa Moore, MD  Multiple Vitamin (MULTIVITAMIN WITH MINERALS) TABS tablet Take 1 tablet by mouth daily. 08/25/21   Enedina Finner, MD  QUEtiapine (SEROQUEL XR) 50 MG TB24 24 hr tablet Take 50 mg by mouth at bedtime. 12/04/21   [provider]  sodium zirconium cyclosilicate (LOKELMA) 10 g PACK packet Take 10 g by mouth daily. 02/23/22   Sunnie Nielsen, DO      Allergies    Cephalosporins, Hydromorphone, Keflin [cephalothin], and Lactose intolerance (gi)    Review of Systems   Review of Systems  Physical Exam Updated Vital Signs BP 97/75   Pulse 87   Temp 99 F (37.2 C) (Oral)   Resp 16   Ht 5' (1.524 m)   Wt 54.4 kg   SpO2 100%   BMI 23.44 kg/m  Physical Exam Vitals and nursing note reviewed.  Constitutional:      General: She is not in acute distress.    Appearance: She is well-developed.  HENT:     Head: Normocephalic and atraumatic.     Comments: complains of tenderness in the right temporal area but no swelling, abrasion, laceration, contusion    Mouth/Throat:     Mouth: Mucous membranes are moist.  Eyes:     General: Vision  grossly intact. Gaze aligned appropriately.     Extraocular Movements: Extraocular movements intact.     Conjunctiva/sclera: Conjunctivae normal.  Cardiovascular:     Rate and Rhythm: Normal rate and regular rhythm.     Pulses: Normal pulses.     Heart sounds: Normal heart sounds, S1 normal and S2 normal. No murmur heard.    No friction rub. No gallop.  Pulmonary:     Effort: Pulmonary effort is normal. No respiratory distress.     Breath sounds: Normal breath sounds.  Abdominal:     General: Bowel sounds are normal.     Palpations: Abdomen is soft.     Tenderness: There is no abdominal tenderness. There is no guarding or rebound.     Hernia: No hernia is present.  Musculoskeletal:        General: No swelling.     Cervical  back: Full passive range of motion without pain, normal range of motion and neck supple. No spinous process tenderness or muscular tenderness. Normal range of motion.     Right lower leg: No edema.     Left lower leg: No edema.  Skin:    General: Skin is warm and dry.     Capillary Refill: Capillary refill takes less than 2 seconds.     Findings: No ecchymosis, erythema, rash or wound.  Neurological:     General: No focal deficit present.     Mental Status: She is alert. Mental status is at baseline. She is confused.     GCS: GCS eye subscore is 4. GCS verbal subscore is 5. GCS motor subscore is 6.     Cranial Nerves: Cranial nerves 2-12 are intact.     Sensory: Sensation is intact.     Motor: Motor function is intact.     Coordination: Coordination is intact.  Psychiatric:        Attention and Perception: Attention normal.        Mood and Affect: Mood normal.        Speech: Speech normal.        Behavior: Behavior normal.     ED Results / Procedures / Treatments   Labs (all labs ordered are listed, but only abnormal results are displayed) Labs Reviewed - No data to display  EKG None  Radiology CT CERVICAL SPINE WO CONTRAST  Result Date: 06/23/2022 CLINICAL DATA:  Status post trauma. EXAM: CT CERVICAL SPINE WITHOUT CONTRAST TECHNIQUE: Multidetector CT imaging of the cervical spine was performed without intravenous contrast. Multiplanar CT image reconstructions were also generated. RADIATION DOSE REDUCTION: This exam was performed according to the departmental dose-optimization program which includes automated exposure control, adjustment of the mA and/or kV according to patient size and/or use of iterative reconstruction technique. COMPARISON:  None Available. FINDINGS: Alignment: There is mild reversal of the normal cervical spine lordosis. Approximately 1 mm to 2 mm anterolisthesis of the C4 vertebral body is noted on C5. Skull base and vertebrae: No acute fracture. No primary  bone lesion or focal pathologic process. Soft tissues and spinal canal: No prevertebral fluid or swelling. No visible canal hematoma. Disc levels: Moderate severity endplate sclerosis, marked severity anterior osteophyte formation and mild to moderate severity posterior bony spurring are seen at the levels of C5-C6 and C6-C7. There is moderate severity narrowing of the anterior atlantoaxial articulation. Marked severity intervertebral disc space narrowing is seen at C5-C6 and C6-C7. Bilateral marked severity multilevel facet joint hypertrophy is noted. Upper chest: There is bilateral centrilobular and  paraseptal emphysematous lung disease. Other: None. IMPRESSION: 1. No acute fracture within the cervical spine. 2. Marked severity degenerative changes at the levels of C5-C6 and C6-C7. 3. Approximately 1 mm to 2 mm anterolisthesis of the C4 vertebral body on C5. 4. Bilateral centrilobular and paraseptal emphysematous lung disease. Emphysema (ICD10-J43.9). Electronically Signed   By: Aram Candela M.D.   On: 06/23/2022 01:15   CT HEAD WO CONTRAST ( )  Result Date: 06/23/2022 CLINICAL DATA:  Status post trauma. EXAM: CT HEAD WITHOUT CONTRAST TECHNIQUE: Contiguous axial images were obtained from the base of the skull through the vertex without intravenous contrast. RADIATION DOSE REDUCTION: This exam was performed according to the departmental dose-optimization program which includes automated exposure control, adjustment of the mA and/or kV according to patient size and/or use of iterative reconstruction technique. COMPARISON:  June 06, 2022 FINDINGS: Brain: There is mild to moderate severity cerebral atrophy with widening of the extra-axial spaces and ventricular dilatation. There are areas of decreased attenuation within the white matter tracts of the supratentorial brain, consistent with microvascular disease changes. Chronic left cerebellar, right frontal lobe and left parieto-occipital infarcts are seen.  Chronic bilateral basal ganglia lacunar infarcts are also noted. Vascular: No hyperdense vessel or unexpected calcification. Skull: Normal. Negative for fracture or focal lesion. Sinuses/Orbits: No acute finding. Other: None. IMPRESSION: 1. No acute intracranial abnormality. 2. Chronic left cerebellar, right frontal lobe and left parieto-occipital infarcts. 3. Chronic bilateral basal ganglia lacunar infarcts. 4. 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 Electronically Signed   By: Aram Candela M.D.   On: 06/23/2022 01:13    Procedures Procedures    Medications Ordered in ED Medications - No data to display  ED Course/ Medical Decision Making/ A&P                             Medical Decision Making Amount and/or Complexity of Data Reviewed Radiology: ordered.   Differential diagnosis considered includes, but not limited to: Contusion; concussion; ICH; cervical spine injury  Patient presents after a fall from bed.  Patient complaining of pain on the right side of her head.  There is no associated contusion.  She is confused, at her baseline, has history of dementia.  She complains of some neck pain, examination reveals some mild diffuse tenderness, no focal tenderness.  CT head and neck are unremarkable.  Remainder of examination was unremarkable.  She has no evidence of trauma, no pain or tenderness in the upper extremities.  Lower extremities are atraumatic as well, able to move hips through full range of motion without pain.        Final Clinical Impression(s) / ED Diagnoses Final diagnoses:  Minor head injury, initial encounter  Contusion of scalp, initial encounter    Rx / DC Orders ED Discharge Orders     None         Gilda Crease, MD 06/23/22 [phone]

## 2022-06-23 NOTE — ED Triage Notes (Signed)
Pt [name] from [name]. Pt reports getting out of bed and having an unwitnessed fall. Pt reports hitting her head. Denies thinners. [name] called [facility] for her. Pt c/o right sided forehead pain. No bleeding or hematoma noted. Pt hx dementia confusion at baseline 106/72BP 82HR 94% 109CBG

## 2022-07-05 ENCOUNTER — Encounter (HOSPITAL_COMMUNITY): Payer: Self-pay

## 2022-07-05 ENCOUNTER — Other Ambulatory Visit: Payer: Self-pay

## 2022-07-05 ENCOUNTER — Inpatient Hospital Stay (HOSPITAL_COMMUNITY)
Admission: EM | Admit: 2022-07-05 | Discharge: 2022-07-12 | DRG: 871 | Disposition: A | Discharge status: Skilled Nursing Facility | Payer: Medicare Other | Source: Skilled Nursing Facility | Attending: Internal Medicine | Admitting: Internal Medicine

## 2022-07-05 ENCOUNTER — Emergency Department (HOSPITAL_COMMUNITY): Payer: Medicare Other

## 2022-07-05 DIAGNOSIS — Z803 Family history of malignant neoplasm of breast: Secondary | ICD-10-CM

## 2022-07-05 DIAGNOSIS — R9431 Abnormal electrocardiogram [ECG] [EKG]: Secondary | ICD-10-CM | POA: Diagnosis present

## 2022-07-05 DIAGNOSIS — R269 Unspecified abnormalities of gait and mobility: Secondary | ICD-10-CM | POA: Diagnosis present

## 2022-07-05 DIAGNOSIS — Z8507 Personal history of malignant neoplasm of pancreas: Secondary | ICD-10-CM

## 2022-07-05 DIAGNOSIS — I5032 Chronic diastolic (congestive) heart failure: Secondary | ICD-10-CM | POA: Insufficient documentation

## 2022-07-05 DIAGNOSIS — J189 Pneumonia, unspecified organism: Principal | ICD-10-CM | POA: Diagnosis present

## 2022-07-05 DIAGNOSIS — Z87891 Personal history of nicotine dependence: Secondary | ICD-10-CM

## 2022-07-05 DIAGNOSIS — I951 Orthostatic hypotension: Secondary | ICD-10-CM | POA: Diagnosis present

## 2022-07-05 DIAGNOSIS — A419 Sepsis, unspecified organism: Secondary | ICD-10-CM | POA: Diagnosis not present

## 2022-07-05 DIAGNOSIS — E11649 Type 2 diabetes mellitus with hypoglycemia without coma: Secondary | ICD-10-CM | POA: Diagnosis present

## 2022-07-05 DIAGNOSIS — Z794 Long term (current) use of insulin: Secondary | ICD-10-CM

## 2022-07-05 DIAGNOSIS — F0393 Unspecified dementia, unspecified severity, with mood disturbance: Secondary | ICD-10-CM | POA: Diagnosis present

## 2022-07-05 DIAGNOSIS — J440 Chronic obstructive pulmonary disease with acute lower respiratory infection: Secondary | ICD-10-CM | POA: Diagnosis present

## 2022-07-05 DIAGNOSIS — Z8619 Personal history of other infectious and parasitic diseases: Secondary | ICD-10-CM

## 2022-07-05 DIAGNOSIS — F0394 Unspecified dementia, unspecified severity, with anxiety: Secondary | ICD-10-CM | POA: Diagnosis present

## 2022-07-05 DIAGNOSIS — J449 Chronic obstructive pulmonary disease, unspecified: Secondary | ICD-10-CM | POA: Diagnosis present

## 2022-07-05 DIAGNOSIS — Z885 Allergy status to narcotic agent status: Secondary | ICD-10-CM

## 2022-07-05 DIAGNOSIS — Z79899 Other long term (current) drug therapy: Secondary | ICD-10-CM

## 2022-07-05 DIAGNOSIS — R6520 Severe sepsis without septic shock: Secondary | ICD-10-CM | POA: Diagnosis present

## 2022-07-05 DIAGNOSIS — L930 Discoid lupus erythematosus: Secondary | ICD-10-CM | POA: Diagnosis present

## 2022-07-05 DIAGNOSIS — Z1152 Encounter for screening for COVID-19: Secondary | ICD-10-CM

## 2022-07-05 DIAGNOSIS — Z9049 Acquired absence of other specified parts of digestive tract: Secondary | ICD-10-CM

## 2022-07-05 DIAGNOSIS — Z881 Allergy status to other antibiotic agents status: Secondary | ICD-10-CM

## 2022-07-05 DIAGNOSIS — G2581 Restless legs syndrome: Secondary | ICD-10-CM | POA: Diagnosis present

## 2022-07-05 DIAGNOSIS — F32A Depression, unspecified: Secondary | ICD-10-CM | POA: Diagnosis present

## 2022-07-05 DIAGNOSIS — G40909 Epilepsy, unspecified, not intractable, without status epilepticus: Secondary | ICD-10-CM | POA: Diagnosis present

## 2022-07-05 DIAGNOSIS — G9340 Encephalopathy, unspecified: Secondary | ICD-10-CM | POA: Diagnosis present

## 2022-07-05 DIAGNOSIS — Z933 Colostomy status: Secondary | ICD-10-CM

## 2022-07-05 DIAGNOSIS — E872 Acidosis, unspecified: Secondary | ICD-10-CM | POA: Diagnosis present

## 2022-07-05 DIAGNOSIS — I110 Hypertensive heart disease with heart failure: Secondary | ICD-10-CM | POA: Diagnosis present

## 2022-07-05 DIAGNOSIS — E119 Type 2 diabetes mellitus without complications: Secondary | ICD-10-CM

## 2022-07-05 DIAGNOSIS — E876 Hypokalemia: Secondary | ICD-10-CM | POA: Diagnosis present

## 2022-07-05 DIAGNOSIS — R0902 Hypoxemia: Secondary | ICD-10-CM | POA: Diagnosis present

## 2022-07-05 DIAGNOSIS — E785 Hyperlipidemia, unspecified: Secondary | ICD-10-CM | POA: Diagnosis present

## 2022-07-05 DIAGNOSIS — Z9081 Acquired absence of spleen: Secondary | ICD-10-CM

## 2022-07-05 DIAGNOSIS — Z7982 Long term (current) use of aspirin: Secondary | ICD-10-CM

## 2022-07-05 DIAGNOSIS — G9341 Metabolic encephalopathy: Secondary | ICD-10-CM | POA: Diagnosis present

## 2022-07-05 DIAGNOSIS — Z818 Family history of other mental and behavioral disorders: Secondary | ICD-10-CM

## 2022-07-05 DIAGNOSIS — E739 Lactose intolerance, unspecified: Secondary | ICD-10-CM | POA: Diagnosis present

## 2022-07-05 DIAGNOSIS — Z8673 Personal history of transient ischemic attack (TIA), and cerebral infarction without residual deficits: Secondary | ICD-10-CM

## 2022-07-05 LAB — CBG MONITORING, ED: Glucose-Capillary: 102 mg/dL — ABNORMAL HIGH (ref 70–99)

## 2022-07-05 MED ORDER — SODIUM CHLORIDE 0.9 % IV SOLN
2.0000 g | Freq: Once | INTRAVENOUS | Status: DC
Start: 2022-07-05 — End: 2022-07-05

## 2022-07-05 MED ORDER — LACTATED RINGERS IV BOLUS (SEPSIS)
250.0000 mL | Freq: Once | INTRAVENOUS | Status: AC
  Administered 2022-07-06: 250 mL via INTRAVENOUS

## 2022-07-05 MED ORDER — LACTATED RINGERS IV SOLN: INTRAVENOUS | Status: DC

## 2022-07-05 MED ORDER — LACTATED RINGERS IV BOLUS (SEPSIS)
500.0000 mL | Freq: Once | INTRAVENOUS | Status: AC
  Administered 2022-07-06: 500 mL via INTRAVENOUS

## 2022-07-05 MED ORDER — METRONIDAZOLE 500 MG/100ML IV SOLN
500.0000 mg | Freq: Once | INTRAVENOUS | Status: AC
  Administered 2022-07-06: 500 mg via INTRAVENOUS
  Filled 2022-07-05: qty 100

## 2022-07-05 MED ORDER — SODIUM CHLORIDE 0.9 % IV SOLN
2.0000 g | Freq: Once | INTRAVENOUS | Status: AC
  Administered 2022-07-05: 2 g via INTRAVENOUS
  Filled 2022-07-05: qty 12.5

## 2022-07-05 MED ORDER — LACTATED RINGERS IV BOLUS (SEPSIS)
1000.0000 mL | Freq: Once | INTRAVENOUS | Status: AC
  Administered 2022-07-05: 1000 mL via INTRAVENOUS

## 2022-07-05 MED ORDER — VANCOMYCIN HCL IN DEXTROSE 1-5 GM/200ML-% IV SOLN
1000.0000 mg | Freq: Once | INTRAVENOUS | Status: AC
  Administered 2022-07-06: 1000 mg via INTRAVENOUS
  Filled 2022-07-05: qty 200

## 2022-07-05 MED ORDER — ACETAMINOPHEN 650 MG RE SUPP
650.0000 mg | Freq: Once | RECTAL | Status: AC
  Administered 2022-07-06: 650 mg via RECTAL
  Filled 2022-07-05: qty 1

## 2022-07-05 MED ORDER — ACETAMINOPHEN 325 MG PO TABS: 650.0000 mg | ORAL_TABLET | Freq: Once | ORAL | Status: DC

## 2022-07-05 NOTE — ED Provider Notes (Addendum)
[name] EMERGENCY DE[facility] AT [facility] Provider Note   CSN: [id number] Arrival date & time: 07/05/22  2307     History  Chief Complaint  Patient presents with   Altered Mental Status    Brittany [name] is a 62 y.o. female.  HPI     This is a 62 year old female brought in by EMS with concern for altered mental status.  Per report was last seen normal around 6 AM this morning.  Nursing returned tonight and found that she was having difficulty speaking and talking.  She had to be fed during the day.  This is not normal for her.  Patient is unable to provide any history.  She does give some mumbled verbal response but that is incoherent.  Level 5 caveat for altered mental status and acuity of condition  Home Medications Prior to Admission medications   Medication Sig Start Date End Date Taking? Authorizing Provider  aspirin 81 MG EC tablet Take 1 tablet (81 mg total) by mouth daily. Swallow whole. 05/23/21   Pokhrel, Rebekah Chesterfield, MD  atorvastatin (LIPITOR) 40 MG tablet Take 1 tablet (40 mg total) by mouth Nightly. Patient taking differently: Take 40 mg by mouth at bedtime. 03/14/21 05/13/22  Enedina Finner, MD  buPROPion (WELLBUTRIN XL) 150 MG 24 hr tablet Take 150 mg by mouth daily.    [provider]  divalproex (DEPAKOTE) 500 MG DR tablet Take 1 tablet (500 mg total) by mouth every 12 (twelve) hours. Patient taking differently: Take 500 mg by mouth 3 (three) times daily. 05/17/22   Tresa Moore, MD  feeding supplement, GLUCERNA SHAKE, (GLUCERNA SHAKE) LIQD Take 237 mLs by mouth 3 (three) times daily between meals. 08/24/21   Enedina Finner, MD  fludrocortisone (FLORINEF) 0.1 MG tablet Take 0.5 tablets (0.05 mg total) by mouth daily. 02/23/22   Sunnie Nielsen, DO  furosemide (LASIX) 20 MG tablet Take 1 tablet (20 mg total) by mouth daily. 02/24/22   Sunnie Nielsen, DO  glucose 4 GM chewable tablet Chew 1 tablet by mouth 3 (three) times daily as needed for low  blood sugar.    [provider]  insulin detemir (LEVEMIR) 100 UNIT/ML injection Inject 0.08 mLs (8 Units total) into the skin 2 (two) times daily. 05/17/22   Tresa Moore, MD  mirtazapine (REMERON) 15 MG tablet Take 1 tablet (15 mg total) by mouth at bedtime. 05/17/22   Tresa Moore, MD  Multiple Vitamin (MULTIVITAMIN WITH MINERALS) TABS tablet Take 1 tablet by mouth daily. 08/25/21   Enedina Finner, MD  QUEtiapine (SEROQUEL XR) 50 MG TB24 24 hr tablet Take 50 mg by mouth at bedtime. 12/04/21   [provider]  sodium zirconium cyclosilicate (LOKELMA) 10 g PACK packet Take 10 g by mouth daily. 02/23/22   Sunnie Nielsen, DO      Allergies    Hydromorphone, Keflin [cephalothin], and Lactose intolerance (gi)    Review of Systems   Review of Systems  Unable to perform ROS: Mental status change  Constitutional:  Positive for fever.    Physical Exam Updated Vital Signs BP (!) 88/56   Pulse 80   Temp (!) 101 F (38.3 C) (Rectal)   Resp 14   Ht 1.524 m (5')   Wt 54.4 kg   SpO2 95%   BMI 23.42 kg/m  Physical Exam Vitals and nursing note reviewed.  Constitutional:      Appearance: She is well-developed. She is ill-appearing. She is not toxic-appearing.  HENT:     Head: Normocephalic and atraumatic.     Mouth/Throat:     Mouth: Mucous membranes are dry.  Eyes:     Pupils: Pupils are equal, round, and reactive to light.  Cardiovascular:     Rate and Rhythm: Regular rhythm. Tachycardia present.     Heart sounds: Normal heart sounds.  Pulmonary:     Effort: Pulmonary effort is normal. No respiratory distress.     Breath sounds: No wheezing.  Abdominal:     Palpations: Abdomen is soft.     Tenderness: There is no abdominal tenderness.  Musculoskeletal:        General: No deformity.     Cervical back: Neck supple.  Skin:    General: Skin is warm and dry.  Neurological:     Mental Status: She is alert.     Comments: Unable to assess orientation,  mumbled speech, appears to move all 4 extremities but does not follow commands, no facial droop noted     ED Results / Procedures / Treatments   Labs (all labs ordered are listed, but only abnormal results are displayed) Labs Reviewed  LACTIC ACID, PLASMA - Abnormal; Notable for the following components:      Result Value   Lactic Acid, Venous 2.0 (*)    All other components within normal limits  LACTIC ACID, PLASMA - Abnormal; Notable for the following components:   Lactic Acid, Venous 2.7 (*)    All other components within normal limits  COMPREHENSIVE METABOLIC PANEL - Abnormal; Notable for the following components:   CO2 20 (*)    Glucose, Bld 109 (*)    Creatinine, Ser 1.20 (*)    Calcium 8.6 (*)    Albumin 2.5 (*)    GFR, Estimated 51 (*)    All other components within normal limits  CBC WITH DIFFERENTIAL/PLATELET - Abnormal; Notable for the following components:   WBC 12.7 (*)    RBC 3.57 (*)    Hemoglobin 11.0 (*)    HCT 33.6 (*)    RDW 20.0 (*)    nRBC 0.3 (*)    Neutro Abs 8.9 (*)    Monocytes Absolute 1.5 (*)    All other components within normal limits  CBG MONITORING, ED - Abnormal; Notable for the following components:   Glucose-Capillary 102 (*)    All other components within normal limits  RESP PANEL BY RT-PCR (RSV, FLU A&B, COVID)  RVPGX2  CULTURE, BLOOD (ROUTINE X 2)  CULTURE, BLOOD (ROUTINE X 2)  PROTIME-INR  APTT  URINALYSIS, W/ REFLEX TO CULTURE (INFECTION SUSPECTED)    EKG EKG Interpretation  Date/Time:  Thursday Jul 05 2022 23:27:27 EDT Ventricular Rate:  110 PR Interval:  135 QRS Duration: 68 QT Interval:  307 QTC Calculation: 416 R Axis:   126 Text Interpretation: Sinus tachycardia Right axis deviation Low voltage, extremity leads Borderline repolarization abnormality Confirmed by Ross Marcus ([id number]) on 07/06/2022 2:13:07 AM  Radiology DG Chest Port 1 View  Result Date: 07/06/2022 CLINICAL DATA:  Altered mental status.   Questionable sepsis EXAM: PORTABLE CHEST 1 VIEW COMPARISON:  Radiograph 06/06/2022 FINDINGS: Stable cardiomediastinal silhouette. Aortic atherosclerotic calcification. New hazy airspace opacity about the right lung medially suspicious for pneumonia. Bibasilar atelectasis/scarring. No pleural effusion or pneumothorax. No displaced rib fractures. IMPRESSION: New hazy opacity in the right lung medially suspicious for pneumonia. Electronically Signed   By: Minerva Fester M.D.   On: 07/06/2022 00:17    Procedures .Critical Care  Performed  by: Shon Baton, MD Authorized by: Shon Baton, MD   Critical care provider statement:    Critical care time (minutes):  60   Critical care was necessary to treat or prevent imminent or life-threatening deterioration of the following conditions:  Sepsis and respiratory failure   Critical care was time spent personally by me on the following activities:  Development of treatment plan with patient or surrogate, discussions with consultants, evaluation of patient's response to treatment, examination of patient, ordering and review of laboratory studies, ordering and review of radiographic studies, ordering and performing treatments and interventions, pulse oximetry, re-evaluation of patient's condition and review of old charts     Medications Ordered in ED Medications  lactated ringers infusion ( Intravenous New Bag/Given 07/06/22 0240)  lactated ringers bolus 1,000 mL (0 mLs Intravenous Stopped 07/06/22 0122)    And  lactated ringers bolus 500 mL (0 mLs Intravenous Stopped 07/06/22 0218)    And  lactated ringers bolus 250 mL (0 mLs Intravenous Stopped 07/06/22 0238)  metroNIDAZOLE (FLAGYL) IVPB 500 mg (0 mg Intravenous Stopped 07/06/22 0159)  vancomycin (VANCOCIN) IVPB 1000 mg/200 mL premix (0 mg Intravenous Stopped 07/06/22 0332)  ceFEPIme (MAXIPIME) 2 g in sodium chloride 0.9 % 100 mL IVPB (0 g Intravenous Stopped 07/06/22 0035)  acetaminophen (TYLENOL)  suppository 650 mg (650 mg Rectal Given 07/06/22 0000)  lactated ringers bolus 1,000 mL (1,000 mLs Intravenous New Bag/Given 07/06/22 0352)    ED Course/ Medical Decision Making/ A&P Clinical Course as of 07/06/22 0420  Fri Jul 06, 2022  0354 Patient assessed by Dr. Loney Loh.  She is uncomfortable admitting the patient given patient blood pressure readings.  I have requested nursing to get a manual blood pressure.  Last 2 blood pressure readings 116/70 and 86/83.  All maps have been greater than 65.  1 additional liter of fluids.  Do not feel pressors are indicated at this time.  I did discuss with Dr. Loney Loh that I would consult with critical care but I still feel that the patient is appropriate for stepdown unit.  She is also requesting CT head.  [CH]  0400 Chart reviewed.  Multiple prior visits reviewed including hospitalizations.  It appears baseline BP 85-105 systolic. [CH]  0413 Spoke with critical care, Dr. Delton Coombes.  I reviewed the patient's case.  He feels that she is appropriate for stepdown.  She has received appropriate fluids, antibiotics and is not requiring pressor support.  If critical care is needed, they are available for consult at any time. [CH]    Clinical Course User Index [CH] Kenslie Abbruzzese, Mayer Masker, MD                             Medical Decision Making Amount and/or Complexity of Data Reviewed Labs: ordered. Radiology: ordered. ECG/medicine tests: ordered.  Risk OTC drugs. Prescription drug management. Decision regarding hospitalization.   This patient presents to the ED for concern of altered mental status, this involves an extensive number of treatment options, and is a complaint that carries with it a high risk of complications and morbidity.  I considered the following differential and admission for this acute, potentially life threatening condition.  The differential diagnosis includes sepsis, metabolic encephalopathy, stroke  MDM:    This is a 62 year old female  who presents with altered mental status.  She is speaking in mumbled voice.  She does not provide any information.  She is febrile.  Sepsis workup  was initiated.  Results notable for a white count of 12.  Lactate of 2.0.  COVID and influenza negative.  Chest x-ray is concerning for new infiltrate.  Of note, aside from being febrile, she was mildly hypoxic.  Not likely a lot.  Blood pressure soft initially but maps greater than 65.  She was given 30 cc/kg fluid and broad-spectrum antibiotics.  Suspect pneumonia causing severe sepsis.  Patient was fluid responsive.  DSS is her legal guardian.  No family at bedside.  (Labs, imaging, consults)  Labs: I Ordered, and personally interpreted labs.  The pertinent results include: CBC, CMP, lactate, urinalysis  Imaging Studies ordered: I ordered imaging studies including chest x-ray I independently visualized and interpreted imaging. I agree with the radiologist interpretation  Additional history obtained from notes from living facility.  External records from outside source obtained and reviewed including prior evaluations  Cardiac Monitoring: The patient was maintained on a cardiac monitor.  If on the cardiac monitor, I personally viewed and interpreted the cardiac monitored which showed an underlying rhythm of: Sinus tachycardia   Reevaluation: After the interventions noted above, I reevaluated the patient and found that they have :improved  Social Determinants of Health:  lives in living facility  Disposition: Admit  Co morbidities that complicate the patient evaluation  Past Medical History:  Diagnosis Date   C. 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   PFTs on 06/15/15 with FEV1/FVC ratio of 64%, FEV1 83%, DLCO 47%   Diabetes mellitus without complication (HCC)    Dyslipidemia    History of Clostridium difficile colitis 03/03/2021    Hyperosmolar hyperglycemic state (HHS) (HCC) 11/18/2020   Hypertension    Hypokalemia; hyperkalemia 07/12/2019   Lactic acidosis 11/19/2020   Lupus (HCC)    Pancreatic cancer (HCC)    RLS (restless legs syndrome)    Seizure disorder (HCC)    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     Medicines Meds ordered this encounter  Medications   lactated ringers infusion   AND Linked Order Group    lactated ringers bolus 1,000 mL     Order Specific Question:   Total Body Weight basis for 30 mL/kg  bolus delivery     Answer:   54.4 kg    lactated ringers bolus 500 mL     Order Specific Question:   Total Body Weight basis for 30 mL/kg  bolus delivery     Answer:   54.4 kg    lactated ringers bolus 250 mL     Order Specific Question:   Total Body Weight basis for 30 mL/kg  bolus delivery     Answer:   54.4 kg   DISCONTD: aztreonam (AZACTAM) 2 g in sodium chloride 0.9 % 100 mL IVPB    Order Specific Question:   Antibiotic Indication:    Answer:   Other Indication (list below)    Order Specific Question:   Other Indication:    Answer:   Unknown source   metroNIDAZOLE (FLAGYL) IVPB 500 mg    Order Specific Question:   Antibiotic Indication:    Answer:   Other Indication (list below)    Order Specific Question:   Other Indication:    Answer:   Unknown source   vancomycin (VANCOCIN) IVPB 1000 mg/200 mL premix    Order Specific Question:   Indication:  Answer:   Other Indication (list below)    Order Specific Question:   Other Indication:    Answer:   Unknown source   DISCONTD: acetaminophen (TYLENOL) tablet 650 mg   ceFEPIme (MAXIPIME) 2 g in sodium chloride 0.9 % 100 mL IVPB    Order Specific Question:   Antibiotic Indication:    Answer:   Sepsis    Order Specific Question:   Other Indication:    Answer:   unknown source   acetaminophen (TYLENOL) suppository 650 mg   lactated ringers bolus 1,000 mL    I have reviewed the patients home medicines and have  made adjustments as needed  Problem List / ED Course: Problem List Items Addressed This Visit   None Visit Diagnoses     Pneumonia of right lower lobe due to infectious organism    -  Primary   Relevant Medications   metroNIDAZOLE (FLAGYL) IVPB 500 mg (Completed)   vancomycin (VANCOCIN) IVPB 1000 mg/200 mL premix (Completed)   ceFEPIme (MAXIPIME) 2 g in sodium chloride 0.9 % 100 mL IVPB (Completed)   Severe sepsis (HCC)       Encephalopathy                       Final Clinical Impression(s) / ED Diagnoses Final diagnoses:  Pneumonia of right lower lobe due to infectious organism  Severe sepsis [facility])  Encephalopathy    Rx / DC Orders ED Discharge Orders     None         Swain Acree, Mayer Masker, MD 07/06/22 0225    Shon Baton, MD 07/06/22 9811    Shon Baton, MD 07/06/22 [phone]

## 2022-07-05 NOTE — Progress Notes (Signed)
A consult was received from an ED physician for Azactam & Vancomycin per pharmacy dosing.  The patient's profile has been reviewed for ht/wt/allergies/indication/available labs.    Patient has allergy to "cephalothin" that caused itching documented in 2019.  Since that time she has received courses of Cefepime, Rocephin & Zosyn without an documented issue.   A one time order has been placed for Cefepime 2gm IV and Vancomycin 1gm IV.  Further antibiotics/pharmacy consults should be ordered by admitting physician if indicated.                       Thank you, Junita Push PharmD 07/05/2022  11:42 PM

## 2022-07-05 NOTE — ED Triage Notes (Signed)
Patient BIB EMS from [facility]. Reportedly patient was at baseline at 0600. When nurse returned tonight and found patient altered.  Stated they had to feed patient today, she was not talking and couldn't walk. Patient has dementia at baseline, but is able to walk, talk, and feed self. Staff did not know what time she started acting this way.   On arrival patient responds to pain and has no speech, does not follow directions. Temp 103.9 rectal and O2 80% on RA.

## 2022-07-06 ENCOUNTER — Emergency Department (HOSPITAL_COMMUNITY): Payer: Medicare Other

## 2022-07-06 DIAGNOSIS — A419 Sepsis, unspecified organism: Secondary | ICD-10-CM | POA: Diagnosis present

## 2022-07-06 DIAGNOSIS — G9341 Metabolic encephalopathy: Secondary | ICD-10-CM | POA: Diagnosis present

## 2022-07-06 DIAGNOSIS — G2581 Restless legs syndrome: Secondary | ICD-10-CM | POA: Diagnosis present

## 2022-07-06 DIAGNOSIS — F0394 Unspecified dementia, unspecified severity, with anxiety: Secondary | ICD-10-CM | POA: Diagnosis present

## 2022-07-06 DIAGNOSIS — Z1152 Encounter for screening for COVID-19: Secondary | ICD-10-CM | POA: Diagnosis not present

## 2022-07-06 DIAGNOSIS — E872 Acidosis, unspecified: Secondary | ICD-10-CM | POA: Diagnosis present

## 2022-07-06 DIAGNOSIS — I951 Orthostatic hypotension: Secondary | ICD-10-CM | POA: Diagnosis present

## 2022-07-06 DIAGNOSIS — E876 Hypokalemia: Secondary | ICD-10-CM | POA: Diagnosis present

## 2022-07-06 DIAGNOSIS — R6520 Severe sepsis without septic shock: Secondary | ICD-10-CM | POA: Diagnosis present

## 2022-07-06 DIAGNOSIS — J189 Pneumonia, unspecified organism: Secondary | ICD-10-CM | POA: Diagnosis present

## 2022-07-06 DIAGNOSIS — F0393 Unspecified dementia, unspecified severity, with mood disturbance: Secondary | ICD-10-CM | POA: Diagnosis present

## 2022-07-06 DIAGNOSIS — Z933 Colostomy status: Secondary | ICD-10-CM | POA: Diagnosis not present

## 2022-07-06 DIAGNOSIS — E785 Hyperlipidemia, unspecified: Secondary | ICD-10-CM | POA: Diagnosis present

## 2022-07-06 DIAGNOSIS — I110 Hypertensive heart disease with heart failure: Secondary | ICD-10-CM | POA: Diagnosis present

## 2022-07-06 DIAGNOSIS — Z79899 Other long term (current) drug therapy: Secondary | ICD-10-CM | POA: Diagnosis not present

## 2022-07-06 DIAGNOSIS — Z9049 Acquired absence of other specified parts of digestive tract: Secondary | ICD-10-CM | POA: Diagnosis not present

## 2022-07-06 DIAGNOSIS — E11649 Type 2 diabetes mellitus with hypoglycemia without coma: Secondary | ICD-10-CM | POA: Diagnosis present

## 2022-07-06 DIAGNOSIS — G9340 Encephalopathy, unspecified: Secondary | ICD-10-CM | POA: Diagnosis present

## 2022-07-06 DIAGNOSIS — Z87891 Personal history of nicotine dependence: Secondary | ICD-10-CM | POA: Diagnosis not present

## 2022-07-06 DIAGNOSIS — I5032 Chronic diastolic (congestive) heart failure: Secondary | ICD-10-CM | POA: Diagnosis present

## 2022-07-06 DIAGNOSIS — F32A Depression, unspecified: Secondary | ICD-10-CM | POA: Diagnosis present

## 2022-07-06 DIAGNOSIS — Z794 Long term (current) use of insulin: Secondary | ICD-10-CM | POA: Diagnosis not present

## 2022-07-06 DIAGNOSIS — J440 Chronic obstructive pulmonary disease with acute lower respiratory infection: Secondary | ICD-10-CM | POA: Diagnosis present

## 2022-07-06 DIAGNOSIS — G40909 Epilepsy, unspecified, not intractable, without status epilepticus: Secondary | ICD-10-CM | POA: Diagnosis present

## 2022-07-06 LAB — RESP PANEL BY RT-PCR (RSV, FLU A&B, COVID)  RVPGX2
Influenza A by PCR: NEGATIVE
Influenza B by PCR: NEGATIVE
Resp Syncytial Virus by PCR: NEGATIVE
SARS Coronavirus 2 by RT PCR: NEGATIVE

## 2022-07-06 LAB — URINALYSIS, W/ REFLEX TO CULTURE (INFECTION SUSPECTED)
Bacteria, UA: NONE SEEN
Bilirubin Urine: NEGATIVE
Glucose, UA: NEGATIVE mg/dL
Hgb urine dipstick: NEGATIVE
Ketones, ur: NEGATIVE mg/dL
Leukocytes,Ua: NEGATIVE
Nitrite: NEGATIVE
Protein, ur: NEGATIVE mg/dL
Specific Gravity, Urine: 1.017 (ref 1.005–1.030)
pH: 5 (ref 5.0–8.0)

## 2022-07-06 LAB — PROTIME-INR
INR: 1.2 (ref 0.8–1.2)
Prothrombin Time: 15.2 seconds (ref 11.4–15.2)

## 2022-07-06 LAB — COMPREHENSIVE METABOLIC PANEL
ALT: 21 U/L (ref 0–44)
AST: 29 U/L (ref 15–41)
Albumin: 2.5 g/dL — ABNORMAL LOW (ref 3.5–5.0)
Alkaline Phosphatase: 100 U/L (ref 38–126)
Anion gap: 7 (ref 5–15)
BUN: 20 mg/dL (ref 8–23)
CO2: 20 mmol/L — ABNORMAL LOW (ref 22–32)
Calcium: 8.6 mg/dL — ABNORMAL LOW (ref 8.9–10.3)
Chloride: 109 mmol/L (ref 98–111)
Creatinine, Ser: 1.2 mg/dL — ABNORMAL HIGH (ref 0.44–1.00)
GFR, Estimated: 51 mL/min — ABNORMAL LOW (ref >60)
Glucose, Bld: 109 mg/dL — ABNORMAL HIGH (ref 70–99)
Potassium: 4.7 mmol/L (ref 3.5–5.1)
Sodium: 136 mmol/L (ref 135–145)
Total Bilirubin: 0.4 mg/dL (ref 0.3–1.2)
Total Protein: 7.7 g/dL (ref 6.5–8.1)

## 2022-07-06 LAB — CBC WITH DIFFERENTIAL/PLATELET
Abs Immature Granulocytes: 0.06 10*3/uL (ref 0.00–0.07)
Basophils Absolute: 0 10*3/uL (ref 0.0–0.1)
Basophils Relative: 0 %
Eosinophils Absolute: 0 10*3/uL (ref 0.0–0.5)
Eosinophils Relative: 0 %
HCT: 33.6 % — ABNORMAL LOW (ref 36.0–46.0)
Hemoglobin: 11 g/dL — ABNORMAL LOW (ref 12.0–15.0)
Immature Granulocytes: 1 %
Lymphocytes Relative: 18 %
Lymphs Abs: 2.3 10*3/uL (ref 0.7–4.0)
MCH: 30.8 pg (ref 26.0–34.0)
MCHC: 32.7 g/dL (ref 30.0–36.0)
MCV: 94.1 fL (ref 80.0–100.0)
Monocytes Absolute: 1.5 10*3/uL — ABNORMAL HIGH (ref 0.1–1.0)
Monocytes Relative: 12 %
Neutro Abs: 8.9 10*3/uL — ABNORMAL HIGH (ref 1.7–7.7)
Neutrophils Relative %: 69 %
Platelets: 271 10*3/uL (ref 150–400)
RBC: 3.57 MIL/uL — ABNORMAL LOW (ref 3.87–5.11)
RDW: 20 % — ABNORMAL HIGH (ref 11.5–15.5)
WBC: 12.7 10*3/uL — ABNORMAL HIGH (ref 4.0–10.5)
nRBC: 0.3 % — ABNORMAL HIGH (ref 0.0–0.2)

## 2022-07-06 LAB — LACTIC ACID, PLASMA
Lactic Acid, Venous: 2 mmol/L (ref 0.5–1.9)
Lactic Acid, Venous: 2.5 mmol/L (ref 0.5–1.9)
Lactic Acid, Venous: 2.7 mmol/L (ref 0.5–1.9)

## 2022-07-06 LAB — CBG MONITORING, ED
Glucose-Capillary: 75 mg/dL (ref 70–99)
Glucose-Capillary: 110 mg/dL — ABNORMAL HIGH (ref 70–99)

## 2022-07-06 LAB — BASIC METABOLIC PANEL
Anion gap: 9 (ref 5–15)
BUN: 18 mg/dL (ref 8–23)
CO2: 20 mmol/L — ABNORMAL LOW (ref 22–32)
Calcium: 8.5 mg/dL — ABNORMAL LOW (ref 8.9–10.3)
Chloride: 109 mmol/L (ref 98–111)
Creatinine, Ser: 0.87 mg/dL (ref 0.44–1.00)
GFR, Estimated: >60 mL/min (ref >60)
Glucose, Bld: 81 mg/dL (ref 70–99)
Potassium: 4.4 mmol/L (ref 3.5–5.1)
Sodium: 138 mmol/L (ref 135–145)

## 2022-07-06 LAB — GLUCOSE, CAPILLARY
Glucose-Capillary: 139 mg/dL — ABNORMAL HIGH (ref 70–99)
Glucose-Capillary: 222 mg/dL — ABNORMAL HIGH (ref 70–99)

## 2022-07-06 LAB — CBC
HCT: 35.6 % — ABNORMAL LOW (ref 36.0–46.0)
Hemoglobin: 11.5 g/dL — ABNORMAL LOW (ref 12.0–15.0)
MCH: 30.3 pg (ref 26.0–34.0)
MCHC: 32.3 g/dL (ref 30.0–36.0)
MCV: 93.7 fL (ref 80.0–100.0)
Platelets: 308 10*3/uL (ref 150–400)
RBC: 3.8 MIL/uL — ABNORMAL LOW (ref 3.87–5.11)
RDW: 21 % — ABNORMAL HIGH (ref 11.5–15.5)
WBC: 19.3 10*3/uL — ABNORMAL HIGH (ref 4.0–10.5)
nRBC: 0.2 % (ref 0.0–0.2)

## 2022-07-06 LAB — AMMONIA: Ammonia: 53 umol/L — ABNORMAL HIGH (ref 9–35)

## 2022-07-06 LAB — BRAIN NATRIURETIC PEPTIDE: B Natriuretic Peptide: 100.2 pg/mL — ABNORMAL HIGH (ref 0.0–100.0)

## 2022-07-06 LAB — MRSA NEXT GEN BY PCR, NASAL: MRSA by PCR Next Gen: NOT DETECTED

## 2022-07-06 LAB — TROPONIN I (HIGH SENSITIVITY)
Troponin I (High Sensitivity): 10 ng/L (ref <18)
Troponin I (High Sensitivity): 8 ng/L (ref <18)

## 2022-07-06 LAB — APTT: aPTT: 31 seconds (ref 24–36)

## 2022-07-06 MED ORDER — LACTATED RINGERS IV SOLN: INTRAVENOUS | Status: DC

## 2022-07-06 MED ORDER — VANCOMYCIN HCL 750 MG/150ML IV SOLN: 750.0000 mg | INTRAVENOUS | Status: DC

## 2022-07-06 MED ORDER — FLUDROCORTISONE ACETATE 0.1 MG PO TABS
0.0500 mg | ORAL_TABLET | Freq: Every day | ORAL | Status: DC
  Administered 2022-07-06 – 2022-07-12 (×7): 0.05 mg via ORAL
  Filled 2022-07-06 (×7): qty 0.5

## 2022-07-06 MED ORDER — DIVALPROEX SODIUM 500 MG PO DR TAB
500.0000 mg | DELAYED_RELEASE_TABLET | Freq: Three times a day (TID) | ORAL | Status: DC
  Administered 2022-07-06 – 2022-07-12 (×16): 500 mg via ORAL
  Filled 2022-07-06 (×2): qty 2
  Filled 2022-07-06 (×4): qty 1
  Filled 2022-07-06: qty 2
  Filled 2022-07-06 (×5): qty 1
  Filled 2022-07-06: qty 2
  Filled 2022-07-06 (×2): qty 1
  Filled 2022-07-06 (×2): qty 2
  Filled 2022-07-06 (×3): qty 1
  Filled 2022-07-06 (×3): qty 2
  Filled 2022-07-06: qty 1
  Filled 2022-07-06: qty 2
  Filled 2022-07-06 (×2): qty 1
  Filled 2022-07-06 (×2): qty 2
  Filled 2022-07-06: qty 4
  Filled 2022-07-06: qty 1
  Filled 2022-07-06 (×4): qty 2

## 2022-07-06 MED ORDER — LACTATED RINGERS IV BOLUS
1000.0000 mL | Freq: Once | INTRAVENOUS | Status: AC
  Administered 2022-07-06: 1000 mL via INTRAVENOUS

## 2022-07-06 MED ORDER — IPRATROPIUM-ALBUTEROL 0.5-2.5 (3) MG/3ML IN SOLN: 3.0000 mL | Freq: Four times a day (QID) | RESPIRATORY_TRACT | Status: DC | PRN

## 2022-07-06 MED ORDER — QUETIAPINE FUMARATE ER 50 MG PO TB24: 50.0000 mg | ORAL_TABLET | Freq: Every day | ORAL | Status: DC

## 2022-07-06 MED ORDER — ACETAMINOPHEN 650 MG RE SUPP: 650.0000 mg | Freq: Four times a day (QID) | RECTAL | Status: DC | PRN

## 2022-07-06 MED ORDER — ACETAMINOPHEN 325 MG PO TABS: 650.0000 mg | ORAL_TABLET | Freq: Four times a day (QID) | ORAL | Status: DC | PRN

## 2022-07-06 MED ORDER — BUPROPION HCL ER (XL) 150 MG PO TB24
150.0000 mg | ORAL_TABLET | Freq: Every day | ORAL | Status: DC
  Administered 2022-07-06 – 2022-07-12 (×7): 150 mg via ORAL
  Filled 2022-07-06 (×7): qty 1

## 2022-07-06 MED ORDER — INSULIN ASPART 100 UNIT/ML IJ SOLN
0.0000 [IU] | INTRAMUSCULAR | Status: DC
  Filled 2022-07-06: qty 0.09

## 2022-07-06 MED ORDER — QUETIAPINE FUMARATE 25 MG PO TABS
25.0000 mg | ORAL_TABLET | Freq: Every day | ORAL | Status: DC
  Administered 2022-07-06 – 2022-07-11 (×5): 25 mg via ORAL
  Filled 2022-07-06 (×6): qty 1

## 2022-07-06 MED ORDER — INSULIN ASPART 100 UNIT/ML IJ SOLN
0.0000 [IU] | Freq: Every day | INTRAMUSCULAR | Status: DC
  Administered 2022-07-06: 2 [IU] via SUBCUTANEOUS
  Administered 2022-07-10: 3 [IU] via SUBCUTANEOUS
  Filled 2022-07-06: qty 0.05

## 2022-07-06 MED ORDER — METRONIDAZOLE 500 MG/100ML IV SOLN
500.0000 mg | Freq: Two times a day (BID) | INTRAVENOUS | Status: DC
  Administered 2022-07-06: 500 mg via INTRAVENOUS
  Filled 2022-07-06: qty 100

## 2022-07-06 MED ORDER — INSULIN ASPART 100 UNIT/ML IJ SOLN
0.0000 [IU] | Freq: Three times a day (TID) | INTRAMUSCULAR | Status: DC
  Administered 2022-07-06: 1 [IU] via SUBCUTANEOUS
  Administered 2022-07-07: 3 [IU] via SUBCUTANEOUS
  Administered 2022-07-07 (×2): 2 [IU] via SUBCUTANEOUS
  Administered 2022-07-08 (×2): 1 [IU] via SUBCUTANEOUS
  Administered 2022-07-09: 2 [IU] via SUBCUTANEOUS
  Administered 2022-07-10: 7 [IU] via SUBCUTANEOUS
  Administered 2022-07-10: 1 [IU] via SUBCUTANEOUS
  Filled 2022-07-06: qty 0.09

## 2022-07-06 MED ORDER — ATORVASTATIN CALCIUM 40 MG PO TABS
40.0000 mg | ORAL_TABLET | Freq: Every day | ORAL | Status: DC
  Administered 2022-07-06 – 2022-07-11 (×5): 40 mg via ORAL
  Filled 2022-07-06 (×6): qty 1

## 2022-07-06 MED ORDER — METRONIDAZOLE 500 MG/100ML IV SOLN: 500.0000 mg | Freq: Three times a day (TID) | INTRAVENOUS | Status: DC

## 2022-07-06 MED ORDER — SODIUM CHLORIDE 0.9 % IV SOLN
500.0000 mg | INTRAVENOUS | Status: DC
  Administered 2022-07-06 – 2022-07-07 (×2): 500 mg via INTRAVENOUS
  Filled 2022-07-06 (×2): qty 5

## 2022-07-06 MED ORDER — VANCOMYCIN HCL IN DEXTROSE 1-5 GM/200ML-% IV SOLN: 1000.0000 mg | INTRAVENOUS | Status: DC

## 2022-07-06 MED ORDER — SODIUM CHLORIDE 0.9 % IV SOLN
2.0000 g | Freq: Two times a day (BID) | INTRAVENOUS | Status: AC
  Administered 2022-07-06 – 2022-07-11 (×12): 2 g via INTRAVENOUS
  Filled 2022-07-06 (×12): qty 12.5

## 2022-07-06 NOTE — Sepsis Progress Note (Signed)
Elink monitoring for the code sepsis protocol.  

## 2022-07-06 NOTE — ED Notes (Signed)
Date and time results received: 07/06/22 12:47 AM  (use smartphrase ".now" to insert current time)  Test: Lactic Critical Value: 2.0  Name of Provider Notified: [place]   Orders Received? Or Actions Taken?: Orders Received - See Orders for details

## 2022-07-06 NOTE — Progress Notes (Signed)
Pharmacy Antibiotic Note  Brittany [name] is a 62 y.o. female admitted on 07/05/2022 with sepsis likely secondary to pneumonia.  Pharmacy has been consulted for cefepime and vancomycin dosing.    Although patient lists a cephalothin allergy, she has tolerated penicillins and IV cephalosporins since that allergy was documented.  Antibiotics administered in ED: 5/23 2352 cefepime 2 g IV 5/24 0204 vancomycin 1 g IV 5/24 0035 metronidazole 500 mg IV  Plan: Continue cefepime 2 g IV every 12 hours Continue vancomycin 1 g IV every 36 hours (Goal AUC 400-550, eAUC 509.9, SCr used: 1.2) Change metronidazole ordered by provider from 500 mg IV q8h to q12h per protocol Monitor clinical progress, renal function, vancomycin levels as indicated F/U MRSA PCR, C&S, abx deescalation / LOT   Height: 5' (152.4 cm) Weight: 54.4 kg (119 lb 14.9 oz) IBW/kg (Calculated) : 45.5  Temp (24hrs), Avg:102.5 F (39.2 C), Min:101 F (38.3 C), Max:103.9 F (39.9 C)  Recent Labs  Lab 07/05/22 2334 07/06/22 0229 07/06/22 0538  WBC 12.7*  --  19.3*  CREATININE 1.20*  --   --   LATICACIDVEN 2.0* 2.7* 2.5*    Estimated Creatinine Clearance: 34.9 mL/min (A) (by C-G formula based on SCr of 1.2 mg/dL (H)).    Allergies  Allergen Reactions   Hydromorphone Itching   Keflin [Cephalothin] Itching    Has tolerated penicillins and IV cephalosporins (Rocephin, Cefepime) since this allergy documented   Lactose Intolerance (Gi) Diarrhea     Microbiology results: 5/23 BCx: sent 5/24 MRSA PCR: ordered  Thank you for allowing pharmacy to be a part of this patient's care.  Lynden Ang, PharmD, BCPS 07/06/2022 6:17 AM

## 2022-07-06 NOTE — Progress Notes (Signed)
PROGRESS NOTE    SANTI BENITZ  ZOX:[id number] DOB: 1960/12/04 DOA: 07/05/2022 PCP: Jarrett Soho, NP    Brief Narrative:   DIARRA GEITNER is a 62 y.o. female with past medical history significant for dementia, type 2 diabetes mellitus, HLD, HTN, lupus, history of pancreatic mass, seizure disorder, CVA, RLS, history of colectomy with colostomy, history of recurrent C. difficile colitis, orthostatic hypotension, chronic diastolic congestive heart failure, depression/anxiety, dementia who presented to [facility] ED via EMS from [facility] for evaluation of altered mental status.  Nursing staff reported that patient was baseline at 6 AM and then her nurse returned in the evening and was found confused.  They reportedly had to feed the patient today in which she was not talking, unable to ambulate in which at baseline she is able to feed herself and normally walk without much issue.  On EMS arrival, patient was noted to be febrile with rectal temperature 103.9 F with, and tachycardic into the 110s, hypotensive with systolic blood pressures in the 80s with O2 saturation 80% on room air.  Patient was placed on 3 L nasal cannula and transported to the ED for further evaluation.  In the ED, temperature 103.9 F, HR 111, RR 16, BP 86/71, SpO2 85% on room air.  WBC 12.7, hemoglobin 11.0, platelets 271.  Sodium 136, potassium 4.7, chloride 109, CO2 20, glucose 109, BUN 20, creatinine 1.20.  AST 29, ALT 21, total bilirubin 0.4.  Ammonia level 53.  Lactic acid 2.0.  INR 1.2.  Urinalysis unrevealing.  COVID/influenza/RSV PCR negative.  CT head without contrast with no acute intracranial abnormality, stable appearance of multifocal chronic encephalomalacia, advanced white matter disease.  Chest x-ray with new hazy opacity right lung suspicious for pneumonia.  EDP administered Tylenol, vancomycin, cefepime, metronidazole and greater than 1.75 L IV fluid bolus.  Patient remained hypotensive  with SBP in the 80s and ED physician discussed with on-call critical care who felt patient has low blood pressure at baseline and ICU admission/pressures not indicated at this time.  TRH consulted for admission.  Assessment & Plan:   Severe sepsis, POA Community acquired pneumonia Acute metabolic encephalopathy Lactic acidosis Patient presenting from SNF after being found altered.  On EMS arrival, patient was hypotensive, tachycardic, hypoxic.  WBC count elevated 12.7, lactic acid 2.0, chest x-ray with right lung consolidation/opacity.  CT head unrevealing.  COVID/RSV/influenza PCR negative.  Blood pressure responded to IV fluid resuscitation. -- Discontinue vancomycin, MRSA PCR negative -- Start azithromycin 500 mg IV every 24 hours -- Cefepime 2 g IV every 12 hours -- Discontinue metronidazole --Continue LR at 75 mL/h -- DuoNeb every 6 hours.  Wheezing/shortness of breath  Type 2 diabetes mellitus Home regimen includes Levemir 8 units [place] BID -- SSI for coverage -- CBGs QAC/HS  Essential hypertension Chronic diastolic congestive heart failure Home regimen includes Lasix 20 mg p.o. daily -- Hold Lasix secondary to hypotension -- Strict I's and O's and daily weights -- Monitor BP closely  Hyperlipidemia --Atorvastatin 40 mg p.o. daily  Seizure disorder -- Depakote 500 mg p.o. 3 times daily  History of colectomy with colostomy -- Wound care RN consulted for continued management of ostomy  Depression/anxiety Dementia -- Wellbutrin 150 mg p.o. daily -- Seroquel 50 mg p.o. nightly  Orthostatic hypotension --Continue fludrocortisone 0.05 milligrams p.o. daily  Weakness/debility/deconditioning/gait disturbance: Currently resides at [facility], [place] -- PT/OT evaluation tomorrow   DVT prophylaxis: SCDs Start: 07/06/22 0500    Code Status:  Full Code Family Communication: No family present at bedside this morning  Disposition Plan:  Level of care: Progressive Status  is: Inpatient Remains inpatient appropriate because: IV antibiotics    Consultants:  None  Procedures:  None  Antimicrobials:  Vancomycin 5/23 - 5/23 Metronidazole 5/23 - 5/24 Cefepime 5/23>> Azithromycin 5/24>>  Subjective: Patient seen examined bedside, resting comfortably.  Remains in ED holding area.  More alert, less confused this morning.  Seen by speech therapy and started on diet.  Remains on IV antibiotics.  No family present at bedside.  Blood pressure stable.  Will downgrade from stepdown to progressive bed at this time.  No other specific complaints this morning.  Denies headache, no chest pain, no abdominal pain, no fever.   Objective: Vitals:   07/06/22 0815 07/06/22 0930 07/06/22 1030 07/06/22 1100  BP: 112/70 109/80 108/74 94/71  Pulse: 80 93 88 89  Resp: 16 17 16 18   Temp:    99.3 F (37.4 C)  TempSrc:    Oral  SpO2: 96% 100% 95% 92%  Weight:      Height:        Intake/Output Summary (Last 24 hours) at 07/06/2022 1302 Last data filed at 07/06/2022 0456 Gross per 24 hour  Intake 2986.45 ml  Output --  Net 2986.45 ml   Filed Weights   07/05/22 2320  Weight: 54.4 kg    Examination:  Physical Exam: GEN: NAD, alert and oriented to Place (H[facility]), Time (2024), Person (President: Biden), chronically ill in appearance, appears older than stated age HEENT: NCAT, PERRL, EOMI, sclera clear, dry mucous membranes, poor dentition PULM: Diminished breath sounds bilateral bases, crackles right midlung/base, normal respiratory effort without accessory muscle use, on 3 L nasal cannula with SpO2 99% at rest CV: RRR w/o M/G/R GI: abd soft, NTND, NABS, ostomy noted with brown stool in collection bag MSK: no peripheral edema, moves all extremity independently NEURO: No focal neurological deficits appreciated PSYCH: Depressed mood, flat affect Integumentary: No appreciable rashes/lesions/wounds noted on exposed skin surfaces    Data Reviewed: I have  personally reviewed following labs and imaging studies  CBC: Recent Labs  Lab 07/05/22 2334 07/06/22 0538  WBC 12.7* 19.3*  NEUTROABS 8.9*  --   HGB 11.0* 11.5*  HCT 33.6* 35.6*  MCV 94.1 93.7  PLT 271 308   Basic Metabolic Panel: Recent Labs  Lab 07/05/22 2334 07/06/22 0538  NA 136 138  K 4.7 4.4  CL 109 109  CO2 20* 20*  GLUCOSE 109* 81  BUN 20 18  CREATININE 1.20* 0.87  CALCIUM 8.6* 8.5*   GFR: Estimated Creatinine Clearance: 48.2 mL/min (by C-G formula based on SCr of 0.87 mg/dL). Liver Function Tests: Recent Labs  Lab 07/05/22 2334  AST 29  ALT 21  ALKPHOS 100  BILITOT 0.4  PROT 7.7  ALBUMIN 2.5*   No results for input(s): "LIPASE", "AMYLASE" in the last 168 hours. Recent Labs  Lab 07/06/22 0634  AMMONIA 53*   Coagulation Profile: Recent Labs  Lab 07/05/22 2334  INR 1.2   Cardiac Enzymes: No results for input(s): "CKTOTAL", "CKMB", "CKMBINDEX", "TROPONINI" in the last 168 hours. BNP (last 3 results) No results for input(s): "PROBNP" in the last 8760 hours. HbA1C: No results for input(s): "HGBA1C" in the last 72 hours. CBG: Recent Labs  Lab 07/05/22 2320 07/06/22 0807 07/06/22 1131  GLUCAP 102* 75 110*   Lipid Profile: No results for input(s): "CHOL", "HDL", "LDLCALC", "TRIG", "CHOLHDL", "LDLDIRECT" in the last 72 hours.  Thyroid Function Tests: No results for input(s): "TSH", "T4TOTAL", "FREET4", "T3FREE", "THYROIDAB" in the last 72 hours. Anemia Panel: No results for input(s): "VITAMINB12", "FOLATE", "FERRITIN", "TIBC", "IRON", "RETICCTPCT" in the last 72 hours. Sepsis Labs: Recent Labs  Lab 07/05/22 2334 07/06/22 0229 07/06/22 0538  LATICACIDVEN 2.0* 2.7* 2.5*    Recent Results (from the past 240 hour(s))  Resp panel by RT-PCR (RSV, Flu A&B, Covid) Anterior Nasal Swab     Status: None   Collection Time: 07/06/22 12:20 AM   Specimen: Anterior Nasal Swab  Result Value Ref Range Status   SARS Coronavirus 2 by RT PCR NEGATIVE  NEGATIVE Final    Comment: (NOTE) SARS-CoV-2 target nucleic acids are NOT DETECTED.  The SARS-CoV-2 RNA is generally detectable in upper respiratory specimens during the acute phase of infection. The lowest concentration of SARS-CoV-2 viral copies this assay can detect is 138 copies/mL. A negative result does not preclude SARS-Cov-2 infection and should not be used as the sole basis for treatment or other patient management decisions. A negative result may occur with  improper specimen collection/handling, submission of specimen other than nasopharyngeal swab, presence of viral mutation(s) within the areas targeted by this assay, and inadequate number of viral copies(<138 copies/mL). A negative result must be combined with clinical observations, patient history, and epidemiological information. The expected result is Negative.  Fact Sheet for Patients:  BloggerCourse.com  Fact Sheet for Healthcare Providers:  SeriousBroker.it  This test is no t yet approved or cleared by the [place] FDA and  has been authorized for detection and/or diagnosis of SARS-CoV-2 by FDA under an Emergency Use Authorization (EUA). This EUA will remain  in effect (meaning this test can be used) for the duration of the COVID-19 declaration under Section 564(b)(1) of the Act, 21 U.S.C.section 360bbb-3(b)(1), unless the authorization is terminated  or revoked sooner.       Influenza A by PCR NEGATIVE NEGATIVE Final   Influenza B by PCR NEGATIVE NEGATIVE Final    Comment: (NOTE) The Xpert Xpress SARS-CoV-2/FLU/RSV plus assay is intended as an aid in the diagnosis of influenza from Nasopharyngeal swab specimens and should not be used as a sole basis for treatment. Nasal washings and aspirates are unacceptable for Xpert Xpress SARS-CoV-2/FLU/RSV testing.  Fact Sheet for Patients: BloggerCourse.com  Fact Sheet for Healthcare  Providers: SeriousBroker.it  This test is not yet approved or cleared by the [place] FDA and has been authorized for detection and/or diagnosis of SARS-CoV-2 by FDA under an Emergency Use Authorization (EUA). This EUA will remain in effect (meaning this test can be used) for the duration of the COVID-19 declaration under Section 564(b)(1) of the Act, 21 U.S.C. section 360bbb-3(b)(1), unless the authorization is terminated or revoked.     Resp Syncytial Virus by PCR NEGATIVE NEGATIVE Final    Comment: (NOTE) Fact Sheet for Patients: BloggerCourse.com  Fact Sheet for Healthcare Providers: SeriousBroker.it  This test is not yet approved or cleared by the [place] FDA and has been authorized for detection and/or diagnosis of SARS-CoV-2 by FDA under an Emergency Use Authorization (EUA). This EUA will remain in effect (meaning this test can be used) for the duration of the COVID-19 declaration under Section 564(b)(1) of the Act, 21 U.S.C. section 360bbb-3(b)(1), unless the authorization is terminated or revoked.  Performed at [facility], [address] [place], [place] [address]   MRSA Next Gen by PCR, Nasal     Status: None   Collection Time: 07/06/22  7:43 AM   Specimen: Nasal Mucosa; Nasal Swab  Result Value Ref Range Status   MRSA by PCR Next Gen NOT DETECTED NOT DETECTED Final    Comment: (NOTE) The GeneXpert MRSA Assay (FDA approved for NASAL specimens only), is one component of a comprehensive MRSA colonization surveillance program. It is not intended to diagnose MRSA infection nor to guide or monitor treatment for MRSA infections. Test performance is not FDA approved in patients less than 36 years old. Performed at [facility], [address], [place], [place] [address]          Radiology Studies: CT Head Wo Contrast  Result Date:  07/06/2022 CLINICAL DATA:  62 year old female with altered mental status. EXAM: CT HEAD WITHOUT CONTRAST TECHNIQUE: Contiguous axial images were obtained from the base of the skull through the vertex without intravenous contrast. RADIATION DOSE REDUCTION: This exam was performed according to the departmental dose-optimization program which includes automated exposure control, adjustment of the mA and/or kV according to patient size and/or use of iterative reconstruction technique. COMPARISON:  Head CT 06/23/2022 and earlier. 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 Fairly extensive similar chronic left cerebellar encephalomalacia. No midline shift, ventriculomegaly, mass effect, evidence of mass lesion, intracranial hemorrhage or evidence of cortically based acute infarction. Advanced chronic, Patchy and confluent, bilateral cerebral white matter hypodensity. Small area of chronic cortical encephalomalacia also in the anterior right MCA territory. Stable gray-white matter differentiation throughout the brain. Vascular: No suspicious intracranial vascular hyperdensity. Calcified atherosclerosis at the skull base. Skull: No acute osseous abnormality identified. Sinuses/Orbits: Visualized paranasal sinuses and mastoids are clear. Other: Visualized orbits and scalp soft tissues are within normal limits. IMPRESSION: 1. No acute intracranial abnormality. 2. Stable non contrast CT appearance of multifocal chronic encephalomalacia, advanced white matter disease. Electronically Signed   By: Odessa Fleming M.D.   On: 07/06/2022 05:19   DG Chest Port 1 View  Result Date: 07/06/2022 CLINICAL DATA:  Altered mental status.  Questionable sepsis EXAM: PORTABLE CHEST 1 VIEW COMPARISON:  Radiograph 06/06/2022 FINDINGS: Stable cardiomediastinal silhouette. Aortic atherosclerotic calcification. New hazy airspace opacity about the  right lung medially suspicious for pneumonia. Bibasilar atelectasis/scarring. No pleural effusion or pneumothorax. No displaced rib fractures. IMPRESSION: New hazy opacity in the right lung medially suspicious for pneumonia. Electronically Signed   By: Minerva Fester M.D.   On: 07/06/2022 00:17        Scheduled Meds:  insulin aspart  0-9 Units Subcutaneous Q4H   Continuous Infusions:  ceFEPime (MAXIPIME) IV Stopped (07/06/22 0946)   lactated ringers 125 mL/hr at 07/06/22 0551   metronidazole Stopped (07/06/22 1104)   [START ON 07/07/2022] vancomycin       LOS: 0 days    Time spent: 57 minutes spent on chart review, discussion with nursing staff, consultants, updating family and interview/physical exam; more than 50% of that time was spent in counseling and/or coordination of care.    Alvira Philips [place], DO [facility] Available via Epic secure chat 7am-7pm After these hours, please refer to coverage provider listed on [email or web address] 07/06/2022, 1:02 PM

## 2022-07-06 NOTE — ED Notes (Signed)
Patient's depends and linens changed. Ostomy emptied.

## 2022-07-06 NOTE — Sepsis Progress Note (Signed)
Notified provider of need to order repeat lactic acid. ° °

## 2022-07-06 NOTE — H&P (Signed)
History and Physical    Brittany [name] ZOX:[id number] DOB: [birth year] DOA: 07/05/2022  PCP: Jarrett Soho, NP  Patient coming from: Home  Chief Complaint: AMS  HPI: Brittany [name] is a 62 y.o. female with medical history significant of dementia, COPD, insulin-dependent diabetes, hyperlipidemia, hypertension, lupus, pancreatic mass, seizure disorder, stroke, RLS, history of colectomy with colostomy, recurrent C. difficile colitis, orthostatic hypotension, chronic HFpEF, depression/anxiety presents to the ED via EMS from her nursing facility for evaluation of altered mental status.  Nursing staff reported that patient was at baseline at 6 AM in the morning and then her nurse returned tonight, patient was found altered.  They had to feed the patient today and she was not talking and could not walk.  Patient has dementia at baseline but is normally able to walk, talk, and feed herself.  When EMS arrived, patient was nonverbal.  Found to be febrile with rectal temperature 103.9 F, tachycardic to the 110s, hypotensive with systolic in the 80s, and oxygen saturation was 80% on room air.  She was placed on 3 L supplemental oxygen.  Labs showing WBC 12.7, hemoglobin 11.0 (stable), platelet count 271k, sodium 136, potassium 4.7, chloride 109, bicarb 20, BUN 20, creatinine 1.2, glucose 109, UA not suggestive of infection, lactic acid 2.0> 2.7, INR 1.2, COVID/influenza/RSV PCR negative, blood cultures collected.  Chest x-ray showing new hazy opacity in the right lung medially suspicious for pneumonia.  Patient received Tylenol, vancomycin, cefepime, metronidazole, and >1.75 L IV fluids.  Patient remained hypotensive with systolic in the 80s.  ED physician discussed with on-call physician for critical care who felt that the patient has low blood pressure at baseline and that ICU admission/pressors not indicated at this time.  Patient somnolent but arousable.  Very confused and mumbling.  Not following  commands.   Review of Systems:  Review of Systems  Reason unable to perform ROS: AMS.    Past Medical History:  Diagnosis Date   C. 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 She has been exposed to tobacco smoke. She has never used smokeless tobacco. She reports that she does not currently use alcohol. She reports that she does not currently use drugs after having used the following drugs: Marijuana.  Allergies  Allergen Reactions   Hydromorphone Itching   Keflin [Cephalothin] Itching    Has tolerated penicillins and IV cephalosporins (Rocephin, Cefepime) since this allergy documented   Lactose Intolerance (Gi) Diarrhea    Family  History  Problem Relation Age of Onset   Anxiety disorder Sister    Breast cancer Maternal Aunt     Prior to Admission medications   Medication Sig Start Date End Date Taking? Authorizing Provider  aspirin 81 MG EC tablet Take 1 tablet (81 mg total) by mouth daily. Swallow whole. 05/23/21   Pokhrel,  Rebekah Chesterfield, MD  atorvastatin (LIPITOR) 40 MG tablet Take 1 tablet (40 mg total) by mouth Nightly. Patient taking differently: Take 40 mg by mouth at bedtime. 03/14/21 05/13/22  Enedina Finner, MD  buPROPion (WELLBUTRIN XL) 150 MG 24 hr tablet Take 150 mg by mouth daily.    [provider]  divalproex (DEPAKOTE) 500 MG DR tablet Take 1 tablet (500 mg total) by mouth every 12 (twelve) hours. Patient taking differently: Take 500 mg by mouth 3 (three) times daily. 05/17/22   Tresa Moore, MD  feeding supplement, GLUCERNA SHAKE, (GLUCERNA SHAKE) LIQD Take 237 mLs by mouth 3 (three) times daily between meals. 08/24/21   Enedina Finner, MD  fludrocortisone (FLORINEF) 0.1 MG tablet Take 0.5 tablets (0.05 mg total) by mouth daily. 02/23/22   Sunnie Nielsen, DO  furosemide (LASIX) 20 MG tablet Take 1 tablet (20 mg total) by mouth daily. 02/24/22   Sunnie Nielsen, DO  glucose 4 GM chewable tablet Chew 1 tablet by mouth 3 (three) times daily as needed for low blood sugar.    [provider]  insulin detemir (LEVEMIR) 100 UNIT/ML injection Inject 0.08 mLs (8 Units total) into the skin 2 (two) times daily. 05/17/22   Tresa Moore, MD  mirtazapine (REMERON) 15 MG tablet Take 1 tablet (15 mg total) by mouth at bedtime. 05/17/22   Tresa Moore, MD  Multiple Vitamin (MULTIVITAMIN WITH MINERALS) TABS tablet Take 1 tablet by mouth daily. 08/25/21   Enedina Finner, MD  QUEtiapine (SEROQUEL XR) 50 MG TB24 24 hr tablet Take 50 mg by mouth at bedtime. 12/04/21   [provider]  sodium zirconium cyclosilicate (LOKELMA) 10 g PACK packet Take 10 g by mouth daily. 02/23/22   Sunnie Nielsen, DO    Physical Exam: Vitals:   07/06/22 0236 07/06/22 0315 07/06/22 0345 07/06/22 0358  BP:  116/70 (!) 86/63 (!) 88/56  Pulse: 84 99 84 80  Resp: 17 (!) 21 15 14   Temp:      TempSrc:      SpO2: 93% 93% 93% 95%  Weight:      Height:        Physical Exam Vitals reviewed.  Constitutional:       General: She is not in acute distress. HENT:     Head: Normocephalic and atraumatic.  Eyes:     Extraocular Movements: Extraocular movements intact.  Cardiovascular:     Rate and Rhythm: Normal rate and regular rhythm.     Pulses: Normal pulses.  Pulmonary:     Effort: Pulmonary effort is normal. No respiratory distress.     Breath sounds: Normal breath sounds. No wheezing or rales.  Abdominal:     General: Bowel sounds are normal. There is no distension.     Palpations: Abdomen is soft.     Tenderness: There is no abdominal tenderness.  Musculoskeletal:     Cervical back: No rigidity.     Right lower leg: No edema.     Left lower leg: No edema.  Skin:    General: Skin is warm and dry.  Neurological:     Mental Status: She is alert.  Comments: Somnolent but arousable.  Very confused and mumbling.  Not following commands.     Labs on Admission: I have personally reviewed following labs and imaging studies  CBC: Recent Labs  Lab 07/05/22 2334  WBC 12.7*  NEUTROABS 8.9*  HGB 11.0*  HCT 33.6*  MCV 94.1  PLT 271   Basic Metabolic Panel: Recent Labs  Lab 07/05/22 2334  NA 136  K 4.7  CL 109  CO2 20*  GLUCOSE 109*  BUN 20  CREATININE 1.20*  CALCIUM 8.6*   GFR: Estimated Creatinine Clearance: 34.9 mL/min (A) (by C-G formula based on SCr of 1.2 mg/dL (H)). Liver Function Tests: Recent Labs  Lab 07/05/22 2334  AST 29  ALT 21  ALKPHOS 100  BILITOT 0.4  PROT 7.7  ALBUMIN 2.5*   No results for input(s): "LIPASE", "AMYLASE" in the last 168 hours. No results for input(s): "AMMONIA" in the last 168 hours. Coagulation Profile: Recent Labs  Lab 07/05/22 2334  INR 1.2   Cardiac Enzymes: No results for input(s): "CKTOTAL", "CKMB", "CKMBINDEX", "TROPONINI" in the last 168 hours. BNP (last 3 results) No results for input(s): "PROBNP" in the last 8760 hours. HbA1C: No results for input(s): "HGBA1C" in the last 72 hours. CBG: Recent Labs  Lab  07/05/22 2320  GLUCAP 102*   Lipid Profile: No results for input(s): "CHOL", "HDL", "LDLCALC", "TRIG", "CHOLHDL", "LDLDIRECT" in the last 72 hours. Thyroid Function Tests: No results for input(s): "TSH", "T4TOTAL", "FREET4", "T3FREE", "THYROIDAB" in the last 72 hours. Anemia Panel: No results for input(s): "VITAMINB12", "FOLATE", "FERRITIN", "TIBC", "IRON", "RETICCTPCT" in the last 72 hours. Urine analysis:    Component Value Date/Time   COLORURINE YELLOW 07/05/2022 0018   APPEARANCEUR CLEAR 07/05/2022 0018   APPEARANCEUR Hazy 04/04/2011 1935   LABSPEC 1.017 07/05/2022 0018   LABSPEC >1.060 04/04/2011 1935   PHURINE 5.0 07/05/2022 0018   GLUCOSEU NEGATIVE 07/05/2022 0018   GLUCOSEU 50 mg/dL 81/19/1478 2956   HGBUR NEGATIVE 07/05/2022 0018   BILIRUBINUR NEGATIVE 07/05/2022 0018   BILIRUBINUR Negative 04/04/2011 1935   KETONESUR NEGATIVE 07/05/2022 0018   PROTEINUR NEGATIVE 07/05/2022 0018   NITRITE NEGATIVE 07/05/2022 0018   LEUKOCYTESUR NEGATIVE 07/05/2022 0018   LEUKOCYTESUR Negative 04/04/2011 1935    Radiological Exams on Admission: DG Chest Port 1 View  Result Date: 07/06/2022 CLINICAL DATA:  Altered mental status.  Questionable sepsis EXAM: PORTABLE CHEST 1 VIEW COMPARISON:  Radiograph 06/06/2022 FINDINGS: Stable cardiomediastinal silhouette. Aortic atherosclerotic calcification. New hazy airspace opacity about the right lung medially suspicious for pneumonia. Bibasilar atelectasis/scarring. No pleural effusion or pneumothorax. No displaced rib fractures. IMPRESSION: New hazy opacity in the right lung medially suspicious for pneumonia. Electronically Signed   By: Minerva Fester M.D.   On: 07/06/2022 00:17    EKG: Independently reviewed. Sinus tachycardia, new mild ST depressions in lateral leads.   Assessment and Plan  Severe sepsis likely secondary to pneumonia Meets criteria for severe sepsis at the time of presentation with fever, tachycardia, hypotension, acute  hypoxemia, leukocytosis, and lactic acidosis.  Oxygen saturation reportedly 80% on room air, currently satting well on 1 L [place].  Chest x-ray showing new hazy opacity in the right lung medially suspicious for pneumonia.  COVID/influenza/RSV PCR negative.  Patient received broad-spectrum antibiotics and >1.75 L IV fluids in the ED.  She remained hypotensive with systolic in the 80s and lactate trended up on repeat labs.  ED physician had discussed with on-call physician for critical care who felt that the patient has low  blood pressure at baseline and that ICU admission/pressors not indicated at this time.  Patient's blood pressure has now improved with most recent systolic in the 110s and MAP in the 80s.  Will admit to stepdown unit and continue IV fluids and broad-spectrum antibiotics at this time.  Blood cultures pending.  Trend lactate and WBC count.  Tylenol as needed for fevers.  Acute encephalopathy Possibly related to infection/pneumonia.  When I initially evaluated the patient, she was somnolent but arousable, very confused and mumbling and not following commands.  History of prior stroke, stat CT head done and results currently pending.  When I reassessed her, she was able to wake up briefly and tell me her first and last name but then fell asleep again and still not following commands.  UA not suggestive of infection.  No nuchal rigidity to suggest meningitis.  Will also check ammonia level.  Abnormal EKG EKG showing new mild ST depressions in lateral leads.  Stat troponin x 2 ordered.  COPD Stable, no wheezing.  DuoNeb as needed.  Insulin-dependent diabetes Poorly controlled-last A1c 11.0 in January 2024, repeat A1c ordered.  CBG checks/sensitive sliding scale insulin every 4 hours for now.  Resume home basal insulin after pharmacy med rec is done.  Chronic HFpEF Echo done in September 2023 showing EF 55 to 60%, grade 1 diastolic dysfunction, trivial MVR.  Does not appear volume overloaded at  this time.  Check BNP.  Depression/anxiety Hyperlipidemia Lupus Seizure disorder Pharmacy med rec pending.  DVT prophylaxis: SCDs as CT head results pending at this time Code Status: Full Code by default.  Patient does not have capacity for decision-making, no surrogate or prior directive available. Family Communication: No family available at this time. Level of care: Step Down Unit Admission status: It is my clinical opinion that admission to INPATIENT is reasonable and necessary because of the expectation that this patient will require hospital care that crosses at least 2 midnights to treat this condition based on the medical complexity of the problems presented.  Given the aforementioned information, the predictability of an adverse outcome is felt to be significant.   John Giovanni MD [facility]  If 7PM-7AM, please contact night-coverage [email or web address]  07/06/2022, 4:26 AM

## 2022-07-06 NOTE — Evaluation (Signed)
Clinical/Bedside Swallow Evaluation Patient Details  Name: Brittany [name] MRN: [id number] Date of Birth: 26-Oct-1960  Today's Date: 07/06/2022 Time: SLP Start Time (ACUTE ONLY): 1011 SLP Stop Time (ACUTE ONLY): 1021 SLP Time Calculation (min) (ACUTE ONLY): 10 min  Past Medical History:  Past Medical History:  Diagnosis Date   C. 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   COPD, mild (HCC) 09/16/2015   PFTs on 06/15/15 with FEV1/FVC ratio of 64%, FEV1 83%, DLCO 47%   Diabetes mellitus without complication (HCC)    Dyslipidemia    History of Clostridium difficile colitis 03/03/2021   Hyperosmolar hyperglycemic state (HHS) (HCC) 11/18/2020   Hypertension    Hypokalemia; hyperkalemia 07/12/2019   Lactic acidosis 11/19/2020   Lupus (HCC)    Pancreatic cancer (HCC)    RLS (restless legs syndrome)    Seizure disorder (HCC)    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 female with medical history significant of dementia, COPD, insulin-dependent diabetes, hyperlipidemia, hypertension, lupus, pancreatic mass, seizure disorder, stroke (ST saw fro SLE not BSE), RLS, history of colectomy with colostomy, recurrent C. difficile colitis, orthostatic hypotension, chronic HFpEF, depression/anxiety presents to the ED via EMS from her nursing facility for evaluation of altered mental status.  Chest x-ray showing new hazy opacity in the right lung medially suspicious for pneumonia. Found to have severe sepsis likely secondary to pna, acute  encephalopathy.    Assessment / Plan / Recommendation  Clinical Impression  Pt seen for swallow assessment. She was sleepy/lethargic and needed frequent verbal and tactile cues to arouse for po's. Unable to perform full oromotor exam and difficult to view dentition however no focal weakness appreciated. Appears to may be missing posterior dentition. She consumed thin liquids via straw with consecutive sips without s/s aspiration and coordinating respiration and swallow. Mastication of cracker was adequate despite periods of lethargy and no oral residue present over several trials. Pt has risk factors for possible pharyngeal dysphagia however she appeared to tolerate po's without concern for airway compromise. Recommend she initiate regular texture, thin liquids only when adequately awake, upright position and pills whole in puree until she if fully alert and can take with thin liquids. No further follow up needed at this time. SLP Visit Diagnosis: Dysphagia, unspecified (R13.10)    Aspiration Risk  Mild aspiration risk    Diet Recommendation Regular;Thin liquid   Liquid Administration via: Straw;Cup Medication Administration: Whole meds with puree (if drowsy) Supervision: Staff to assist with self feeding;Full supervision/cueing for compensatory strategies Compensations: Slow rate;Small sips/bites Postural Changes: Seated upright at 90 degrees    Other  Recommendations Oral Care Recommendations: Oral care BID    Recommendations for follow up therapy are one component of a multi-disciplinary discharge planning process, led by the attending physician.  Recommendations may be updated based on patient status, additional functional criteria and insurance authorization.  Follow up Recommendations No SLP follow up      Assistance Recommended at Discharge    Functional Status Assessment Patient has not  had a recent decline in their functional status  Frequency and Duration             Prognosis        Swallow Study   General Date of Onset: 07/05/22 HPI: Brittany [name] is a 62 y.o. female with medical history significant of dementia, COPD, insulin-dependent diabetes, hyperlipidemia, hypertension, lupus, pancreatic mass, seizure disorder, stroke (ST saw fro SLE not BSE), RLS, history of colectomy with colostomy, recurrent C. difficile colitis, orthostatic hypotension, chronic HFpEF, depression/anxiety presents to the ED via EMS from her nursing facility for evaluation of altered mental status.  Chest x-ray showing new hazy opacity in the right lung medially suspicious for pneumonia. Found to have severe sepsis likely secondary to pna, acute encephalopathy. Type of Study: Bedside Swallow Evaluation Previous Swallow Assessment:  (none) Diet Prior to this Study: NPO Temperature Spikes Noted: No Respiratory Status: Nasal cannula History of Recent Intubation: No Behavior/Cognition: Lethargic/Drowsy;Cooperative;Pleasant mood;Requires cueing Oral Cavity Assessment: Within Functional Limits Oral Care Completed by SLP: No Oral Cavity - Dentition:  (difficult to fully view- missing posterior?) Vision: Functional for self-feeding Self-Feeding Abilities: Needs assist (due to lethargy) Patient Positioning: Upright in bed Baseline Vocal Quality: Low vocal intensity Volitional Cough: Cognitively unable to elicit Volitional Swallow: Unable to elicit    Oral/Motor/Sensory Function Overall Oral Motor/Sensory Function:  (unable to fully assess- no focal weakness)   Ice Chips Ice chips: Not tested   Thin Liquid Thin Liquid: Within functional limits Presentation: Cup;Straw    Nectar Thick Nectar Thick Liquid: Not tested   Honey Thick Honey Thick Liquid: Not tested   Puree Puree: Within functional limits   Solid     Solid: Within functional limits      Royce Macadamia 07/06/2022,10:42 AM

## 2022-07-06 NOTE — Consult Note (Signed)
WOC Nurse ostomy consult note; patient with colostomy placed by Dr. Everlene Farrier 03/10/2021 Stoma type/location:  LLQ colostomy  Stomal assessment/size: approximately 1 3/8" well budded, pink and moist (through pouch)  Peristomal assessment: not assessed  Treatment options for stomal/peristomal skin: n/a Output minimal brown stool in pouch at this visit  Ostomy pouching: has on 1 piece at the time of this visit; will switch to 2 piece 2 1/4 system for inpatient use  Education provided: none, patient is confused and resides at a nursing facility  Enrolled patient in [facility] DC program: Yes, previously, well established ostomy    Pouch intact at the time of this visit and was not changed.  Bedside staff may use the following supplies:  2 1/4" skin barrier [place] [phone] 2 1/4" pouch [place] [phone]) and 2" skin barrier ring  [place] [phone]).   WOC team will not follow this established ostomy at this time.  Re-consult if further needs arise.   Thank you,    Priscella Mann MSN, RN-BC, [employer] [phone]

## 2022-07-06 NOTE — ED Notes (Signed)
Urine culture sent to lab with urine sample.  

## 2022-07-06 NOTE — Progress Notes (Signed)
Pharmacy: Re- vancomycin  Updated scr is 0.87 (crcl ~48). Will adjust vancomycin dose to 750 mg q24h for est AUC 435.  Dorna Leitz, PharmD, BCPS 07/06/2022 10:23 AM

## 2022-07-07 DIAGNOSIS — J189 Pneumonia, unspecified organism: Secondary | ICD-10-CM | POA: Diagnosis not present

## 2022-07-07 LAB — BASIC METABOLIC PANEL
Anion gap: 10 (ref 5–15)
BUN: 12 mg/dL (ref 8–23)
CO2: 21 mmol/L — ABNORMAL LOW (ref 22–32)
Calcium: 8.2 mg/dL — ABNORMAL LOW (ref 8.9–10.3)
Chloride: 103 mmol/L (ref 98–111)
Creatinine, Ser: 0.82 mg/dL (ref 0.44–1.00)
GFR, Estimated: >60 mL/min (ref >60)
Glucose, Bld: 199 mg/dL — ABNORMAL HIGH (ref 70–99)
Potassium: 4 mmol/L (ref 3.5–5.1)
Sodium: 134 mmol/L — ABNORMAL LOW (ref 135–145)

## 2022-07-07 LAB — CBC
HCT: 30.7 % — ABNORMAL LOW (ref 36.0–46.0)
Hemoglobin: 10.1 g/dL — ABNORMAL LOW (ref 12.0–15.0)
MCH: 30.5 pg (ref 26.0–34.0)
MCHC: 32.9 g/dL (ref 30.0–36.0)
MCV: 92.7 fL (ref 80.0–100.0)
Platelets: 271 10*3/uL (ref 150–400)
RBC: 3.31 MIL/uL — ABNORMAL LOW (ref 3.87–5.11)
RDW: 20.7 % — ABNORMAL HIGH (ref 11.5–15.5)
WBC: 19.8 10*3/uL — ABNORMAL HIGH (ref 4.0–10.5)
nRBC: 0.2 % (ref 0.0–0.2)

## 2022-07-07 LAB — GLUCOSE, CAPILLARY
Glucose-Capillary: 124 mg/dL — ABNORMAL HIGH (ref 70–99)
Glucose-Capillary: 166 mg/dL — ABNORMAL HIGH (ref 70–99)
Glucose-Capillary: 182 mg/dL — ABNORMAL HIGH (ref 70–99)
Glucose-Capillary: 188 mg/dL — ABNORMAL HIGH (ref 70–99)
Glucose-Capillary: 193 mg/dL — ABNORMAL HIGH (ref 70–99)
Glucose-Capillary: 204 mg/dL — ABNORMAL HIGH (ref 70–99)

## 2022-07-07 LAB — HEMOGLOBIN A1C
Hgb A1c MFr Bld: 10.9 % — ABNORMAL HIGH (ref 4.8–5.6)
Mean Plasma Glucose: 266 mg/dL

## 2022-07-07 LAB — LACTIC ACID, PLASMA: Lactic Acid, Venous: 1.4 mmol/L (ref 0.5–1.9)

## 2022-07-07 LAB — CULTURE, BLOOD (ROUTINE X 2): Special Requests: ADEQUATE

## 2022-07-07 LAB — MAGNESIUM: Magnesium: 1.4 mg/dL — ABNORMAL LOW (ref 1.7–2.4)

## 2022-07-07 LAB — PHOSPHORUS: Phosphorus: 3.2 mg/dL (ref 2.5–4.6)

## 2022-07-07 MED ORDER — AZITHROMYCIN 250 MG PO TABS
500.0000 mg | ORAL_TABLET | Freq: Every day | ORAL | Status: AC
  Administered 2022-07-08 – 2022-07-10 (×3): 500 mg via ORAL
  Filled 2022-07-07 (×3): qty 2

## 2022-07-07 MED ORDER — INSULIN GLARGINE-YFGN 100 UNIT/ML ~~LOC~~ SOLN
5.0000 [IU] | Freq: Every day | SUBCUTANEOUS | Status: DC
  Administered 2022-07-07 – 2022-07-10 (×4): 5 [IU] via SUBCUTANEOUS
  Filled 2022-07-07 (×5): qty 0.05

## 2022-07-07 MED ORDER — MAGNESIUM SULFATE 4 GM/100ML IV SOLN
4.0000 g | Freq: Once | INTRAVENOUS | Status: AC
  Administered 2022-07-07: 4 g via INTRAVENOUS
  Filled 2022-07-07: qty 100

## 2022-07-07 NOTE — Evaluation (Signed)
Physical Therapy Evaluation Patient Details Name: Brittany [name] MRN: [id number] DOB: 28-May-1960 Today's Date: 07/07/2022  History of Present Illness  Patient is a 62 year old female who presented from LTC SNF with AMS. Patient was admitted with severe sepsis, acute metabolic encephalopathy, CAP, lactic acidosis. PMH: seizure disorder, colectomy with colostomy, depression, dementia, anxiety, othostatic  Clinical Impression  Pt admitted with above diagnosis.  Pt limited by cognition, cooperative but with difficulty processing. Pt is from SNF however per previous notes pt was ambulatory. Pt denies dizziness, BP 132/86 after transfer to recliner.   Pt currently with functional limitations due to the deficits listed below (see PT Problem List). Pt will benefit from acute skilled PT to increase their independence and safety with mobility to allow discharge.           Recommendations for follow up therapy are one component of a multi-disciplinary discharge planning process, led by the attending physician.  Recommendations may be updated based on patient status, additional functional criteria and insurance authorization.  Follow Up Recommendations Can patient physically be transported by private vehicle: No     Assistance Recommended at Discharge Intermittent Supervision/Assistance  Patient can return home with the following  Help with stairs or ramp for entrance;Assistance with cooking/housework;Assist for transportation;A lot of help with walking and/or transfers    Equipment Recommendations None recommended by PT  Recommendations for Other Services       Functional Status Assessment Patient has had a recent decline in their functional status and demonstrates the ability to make significant improvements in function in a reasonable and predictable amount of time.     Precautions / Restrictions Precautions Precautions: Fall Precaution Comments: orthostatic Restrictions Weight Bearing  Restrictions: No      Mobility  Bed Mobility Overal bed mobility: Needs Assistance Bed Mobility: Supine to Sit     Supine to sit: Min assist, Mod assist     General bed mobility comments: assist to elevate trunk and pad used to scoot to EOB    Transfers Overall transfer level: Needs assistance   Transfers: Sit to/from Stand, Bed to chair/wheelchair/BSC Sit to Stand: Total assist          Lateral/Scoot Transfers: Mod assist General transfer comment: attempted to stand, unable with +1 assist, pt with difficulty processing transition to stand; with multi-modal cues and assist  pt able to scoot laterally bed to chair    Ambulation/Gait                  Stairs            Wheelchair Mobility    Modified Rankin (Stroke Patients Only)       Balance Overall balance assessment: Needs assistance Sitting-balance support: Feet supported, Single extremity supported, No upper extremity supported Sitting balance-Leahy Scale: Fair     Standing balance support: Bilateral upper extremity supported, Reliant on assistive device for balance Standing balance-Leahy Scale: Zero Standing balance comment: pt was abl eto stand during OT session--unable with PT--appears more related to cognition than strength                             Pertinent Vitals/Pain Pain Assessment Pain Assessment: No/denies pain    Home Living Family/patient expects to be discharged to:: Skilled nursing facility                        Prior Function Prior Level of  Function : Patient poor historian/Family not available             Mobility Comments: per previous notes pt amb without device--pt is unable to provide info this date ADLs Comments: patient reported living at home with daughter and grandchildren. chart noted to have ALF and SNF listed     Hand Dominance   Dominant Hand: Right    Extremity/Trunk Assessment   Upper Extremity Assessment Upper Extremity  Assessment: Defer to OT evaluation;Generalized weakness    Lower Extremity Assessment Lower Extremity Assessment: Generalized weakness;Difficult to assess due to impaired cognition    Cervical / Trunk Assessment Cervical / Trunk Assessment: Normal  Communication   Communication: Expressive difficulties  Cognition Arousal/Alertness: Awake/alert Behavior During Therapy: Flat affect Overall Cognitive Status: No family/caregiver present to determine baseline cognitive functioning                                 General Comments: pt pleasant, difficulty processing at times -unsure if this is baseline, follows commands with incr time        General Comments      Exercises     Assessment/Plan    PT Assessment Patient needs continued PT services  PT Problem List Decreased strength;Decreased activity tolerance;Decreased mobility;Decreased knowledge of precautions;Decreased balance;Decreased cognition       PT Treatment Interventions DME instruction;Gait training;Functional mobility training;Therapeutic activities;Patient/family education;Therapeutic exercise    PT Goals (Current goals can be found in the Care Plan section)  Acute Rehab PT Goals PT Goal Formulation: Patient unable to participate in goal setting Time For Goal Achievement: 07/27/22 Potential to Achieve Goals: Fair    Frequency Min 1X/week     Co-evaluation               AM-PAC PT "6 Clicks" Mobility  Outcome Measure Help needed turning from your back to your side while in a flat bed without using bedrails?: A Little Help needed moving from lying on your back to sitting on the side of a flat bed without using bedrails?: A Little Help needed moving to and from a bed to a chair (including a wheelchair)?: A Lot Help needed standing up from a chair using your arms (e.g., wheelchair or bedside chair)?: Total Help needed to walk in hospital room?: Total Help needed climbing 3-5 steps with a  railing? : Total 6 Click Score: 11    End of Session   Activity Tolerance: Other (comment) (ltd by cognition) Patient left: with call bell/phone within reach;with chair alarm set;in chair;with nursing/sitter in room   PT Visit Diagnosis: Other abnormalities of gait and mobility (R26.89);Difficulty in walking, not elsewhere classified (R26.2)    Time: 1610-9604 PT Time Calculation (min) (ACUTE ONLY): 18 min   Charges:   PT Evaluation $PT Eval Low Complexity: 1 Low          Ulas Zuercher, PT  Acute Rehab Dept [facility]) [phone]  07/07/2022   [facility] 07/07/2022, 12:47 PM

## 2022-07-07 NOTE — Progress Notes (Signed)
Occupational Therapy Evaluation Patient Details Name: Brittany [name] MRN: [id number] DOB: [birth year] Today's Date: 07/07/2022   History of Present Illness Patient is a 62 year old female who presented from LTC SNF with AMS. Patient was admitted with severe sepsis, acute metabolic encephalopathy, CAP, lactic acidosis. PMH: seizure disorder, colectomy with colostomy, depression, dementia, anxiety, othostatic   Clinical Impression   Patient is a 62 year old female who was admitted for above. Patient reported living at home with daughter and grandchildren prior to admission. Hospital notes indicate that patient  was at SNF prior to arrival to the hospital. Patient currently was very limited with dizziness and attempting to produce emesis with attempts at getting orthostatic vitals standing EOB. Patient returned to bed with nurse called. Patient was noted to have decreased functional activity tolerance, decreased standing balance, decreased strength, increased dizziness impacting participation in ADLs.Patient would continue to benefit from skilled OT services at this time while admitted and after d/c to address noted deficits in order to improve overall safety and independence in ADLs.       Recommendations for follow up therapy are one component of a multi-disciplinary discharge planning process, led by the attending physician.  Recommendations may be updated based on patient status, additional functional criteria and insurance authorization.   Assistance Recommended at Discharge Frequent or constant Supervision/Assistance  Patient can return home with the following A lot of help with bathing/dressing/bathroom;A lot of help with walking and/or transfers;Direct supervision/assist for financial management;Help with stairs or ramp for entrance;Assist for transportation;Direct supervision/assist for medications management;Assistance with cooking/housework    Functional Status Assessment  Patient has had  a recent decline in their functional status and demonstrates the ability to make significant improvements in function in a reasonable and predictable amount of time.  Equipment Recommendations  None recommended by OT       Precautions / Restrictions Precautions Precautions: Fall Precaution Comments: orthostatic Restrictions Weight Bearing Restrictions: No      Mobility Bed Mobility Overal bed mobility: Needs Assistance Bed Mobility: Supine to Sit     Supine to sit: Min assist     General bed mobility comments: with physical  A to get BLE to touch floor sitting EOB          Balance Overall balance assessment: Needs assistance Sitting-balance support: Feet supported Sitting balance-Leahy Scale: Fair     Standing balance support: Bilateral upper extremity supported, Reliant on assistive device for balance Standing balance-Leahy Scale: Poor                             ADL either performed or assessed with clinical judgement   ADL Overall ADL's : Needs assistance/impaired Eating/Feeding: Set up   Grooming: Minimal assistance;Sitting   Upper Body Bathing: Minimal assistance;Sitting   Lower Body Bathing: Total assistance;Sitting/lateral leans;Sit to/from stand   Upper Body Dressing : Minimal assistance;Sitting   Lower Body Dressing: Total assistance;Sitting/lateral leans;Sit to/from Market researcher Details (indicate cue type and reason): unable attempted standing to get BP with patient reporting increased dizziness and attempted to produe emesis with patient returned to sitting. BPs as noted in chart. Toileting- Clothing Manipulation and Hygiene: Total assistance;Sit to/from stand                            Pertinent Vitals/Pain Pain Assessment Pain Assessment: Faces Faces Pain Scale: Hurts a little bit Pain  Descriptors / Indicators: Headache Pain Intervention(s): Monitored during session, Limited activity within patient's  tolerance     Hand Dominance Right   Extremity/Trunk Assessment Upper Extremity Assessment Upper Extremity Assessment: Generalized weakness (strength symmetrical)   Lower Extremity Assessment Lower Extremity Assessment: Defer to PT evaluation   Cervical / Trunk Assessment Cervical / Trunk Assessment: Normal   Communication Communication Communication: Expressive difficulties   Cognition Arousal/Alertness: Lethargic Behavior During Therapy: Flat affect Overall Cognitive Status: No family/caregiver present to determine baseline cognitive functioning                                 General Comments: patient was plesant and cooperative during session. noted to fall sleep at times during session but ablee to come back to task.                Home Living Family/patient expects to be discharged to:: Skilled nursing facility       Prior Functioning/Environment Prior Level of Function : Needs assist               ADLs Comments: patient reported living at home with daughter and grandchildren. chart noted to have ALF and SNF listed        OT Problem List: Decreased activity tolerance;Impaired balance (sitting and/or standing);Decreased coordination;Decreased safety awareness;Decreased knowledge of precautions;Cardiopulmonary status limiting activity;Decreased knowledge of use of DME or AE      OT Treatment/Interventions: Self-care/ADL training;Energy conservation;Therapeutic exercise;DME and/or AE instruction;Therapeutic activities;Patient/family education;Balance training    OT Goals(Current goals can be found in the care plan section) Acute Rehab OT Goals Patient Stated Goal: to get home with family OT Goal Formulation: Patient unable to participate in goal setting Time For Goal Achievement: 07/21/22 Potential to Achieve Goals: Fair  OT Frequency: Min 2X/week       AM-PAC OT "6 Clicks" Daily Activity     Outcome Measure Help from another person  eating meals?: A Little Help from another person taking care of personal grooming?: A Little Help from another person toileting, which includes using toliet, bedpan, or urinal?: A Lot Help from another person bathing (including washing, rinsing, drying)?: A Lot Help from another person to put on and taking off regular upper body clothing?: A Little Help from another person to put on and taking off regular lower body clothing?: A Lot 6 Click Score: 15   End of Session Equipment Utilized During Treatment: Gait belt;Rolling walker (2 wheels) Nurse Communication: Mobility status  Activity Tolerance: Patient limited by fatigue;Other (comment) (attempting to produce emesis) Patient left: in bed;with call bell/phone within reach;with bed alarm set  OT Visit Diagnosis: Unsteadiness on feet (R26.81);Other abnormalities of gait and mobility (R26.89);History of falling (Z91.81);Dizziness and giddiness (R42)                Time: 1610-9604 OT Time Calculation (min): 26 min Charges:  OT General Charges $OT Visit: 1 Visit OT Evaluation $OT Eval Moderate Complexity: 1 Mod OT Treatments $Self Care/Home Management : 8-22 mins  Rosalio Loud, MS Acute Rehabilitation Department Office# [phone]   Selinda Flavin 07/07/2022, 12:38 PM

## 2022-07-07 NOTE — Progress Notes (Signed)
PHARMACY NOTE -  Cefepime  Pharmacy has been assisting with dosing of cefepime for Community Acquired Pneumonia. Dosage remains stable at 2g IV q12 hr and further renal adjustments per institutional Pharmacy antibiotic protocol  Pharmacy will sign off, following peripherally for culture results, dose adjustments, and length of therapy. Please reconsult if a change in clinical status warrants re-evaluation of dosage.  Bernadene Person, PharmD, BCPS [phone] 07/07/2022, 11:19 AM

## 2022-07-07 NOTE — Progress Notes (Addendum)
PROGRESS NOTE    YAEL SHERMER  WUJ:[id number] DOB: 31-May-1960 DOA: 07/05/2022 PCP: Jarrett Soho, NP    Brief Narrative:   Brittany [name] is a 62 y.o. female with past medical history significant for dementia, type 2 diabetes mellitus, HLD, HTN, lupus, history of pancreatic mass, seizure disorder, CVA, RLS, history of colectomy with colostomy, history of recurrent C. difficile colitis, orthostatic hypotension, chronic diastolic congestive heart failure, depression/anxiety, dementia who presented to [facility] ED via EMS from [facility] for evaluation of altered mental status.  Nursing staff reported that patient was baseline at 6 AM and then her nurse returned in the evening and was found confused.  They reportedly had to feed the patient today in which she was not talking, unable to ambulate in which at baseline she is able to feed herself and normally walk without much issue.  On EMS arrival, patient was noted to be febrile with rectal temperature 103.9 F with, and tachycardic into the 110s, hypotensive with systolic blood pressures in the 80s with O2 saturation 80% on room air.  Patient was placed on 3 L nasal cannula and transported to the ED for further evaluation.  In the ED, temperature 103.9 F, HR 111, RR 16, BP 86/71, SpO2 85% on room air.  WBC 12.7, hemoglobin 11.0, platelets 271.  Sodium 136, potassium 4.7, chloride 109, CO2 20, glucose 109, BUN 20, creatinine 1.20.  AST 29, ALT 21, total bilirubin 0.4.  Ammonia level 53.  Lactic acid 2.0.  INR 1.2.  Urinalysis unrevealing.  COVID/influenza/RSV PCR negative.  CT head without contrast with no acute intracranial abnormality, stable appearance of multifocal chronic encephalomalacia, advanced white matter disease.  Chest x-ray with new hazy opacity right lung suspicious for pneumonia.  EDP administered Tylenol, vancomycin, cefepime, metronidazole and greater than 1.75 L IV fluid bolus.  Patient remained hypotensive  with SBP in the 80s and ED physician discussed with on-call critical care who felt patient has low blood pressure at baseline and ICU admission/pressures not indicated at this time.  TRH consulted for admission.  Assessment & Plan:   Severe sepsis, POA Community acquired pneumonia Acute metabolic encephalopathy Lactic acidosis Patient presenting from SNF after being found altered.  On EMS arrival, patient was hypotensive, tachycardic, hypoxic.  WBC count elevated 12.7, lactic acid 2.0, chest x-ray with right lung consolidation/opacity.  CT head unrevealing.  COVID/RSV/influenza PCR negative.  Blood pressure responded to IV fluid resuscitation.  MRSA PCR negative. -- WBC 12.7>19.3>19.8 -- Lactic acid 2.0>2.7>2.5>1.4 -- Azithromycin 500 mg IV every 24 hours -- Cefepime 2 g IV every 12 hours -- Discontinue metronidazole -- DuoNeb q6h PRN Wheezing/shortness of breath -- Continue supplemental oxygen, maintain SpO2 > 92%, currently on 1 L nasal cannula with SpO2 98% at rest; continue to wean O2 as able  Hypomagnesemia Magnesium 1.4, will replete. -- Repeat magnesium level in a.m.  Type 2 diabetes mellitus Hemoglobin A1c 10.9, poorly controlled.  Home regimen includes Levemir 8 units [place] BID --Semglee 5 units  daily -- SSI for coverage -- CBGs QAC/HS  Essential hypertension Chronic diastolic congestive heart failure Home regimen includes Lasix 20 mg p.o. daily -- Hold Lasix secondary to hypotension -- Strict I's and O's and daily weights -- Monitor BP closely  Hyperlipidemia -- Atorvastatin 40 mg p.o. daily  Seizure disorder -- Depakote 500 mg p.o. 3 times daily  History of colectomy with colostomy -- Wound care RN consulted/following for continued management of ostomy  Depression/anxiety Dementia --  Wellbutrin 150 mg p.o. daily -- Seroquel 25 mg p.o. nightly  Orthostatic hypotension --Continue fludrocortisone 0.05 milligrams p.o.  daily  Weakness/debility/deconditioning/gait disturbance: Currently resides at [facility], [place] -- PT/OT evaluation today   DVT prophylaxis: SCDs Start: 07/06/22 0500    Code Status: Full Code Family Communication: No family present at bedside this morning  Disposition Plan:  Level of care: Progressive Status is: Inpatient Remains inpatient appropriate because: IV antibiotics    Consultants:  None  Procedures:  None  Antimicrobials:  Vancomycin 5/23 - 5/23 Metronidazole 5/23 - 5/24 Cefepime 5/23>> Azithromycin 5/24>>  Subjective: Patient seen examined bedside, resting comfortably.  Lying in bed.  Much more alert today.  Tmax 100.6 past 24 hours, much improved.  Remains on IV antibiotics.  No family present at bedside. No other specific complaints this morning.  Denies headache, no chest pain, no abdominal pain, no nausea/vomiting/diarrhea.  No acute concerns overnight per nursing staff.  Objective: Vitals:   07/07/22 0407 07/07/22 1052 07/07/22 1056 07/07/22 1100  BP: (!) 138/96 117/79 112/89 116/78  Pulse: 77 80 86 (!) 111  Resp: 18     Temp: 98.2 F (36.8 C)     TempSrc: Oral     SpO2: 98%     Weight:      Height:        Intake/Output Summary (Last 24 hours) at 07/07/2022 1253 Last data filed at 07/07/2022 3244 Gross per 24 hour  Intake 1956.78 ml  Output 1825 ml  Net 131.78 ml   Filed Weights   07/05/22 2320  Weight: 54.4 kg    Examination:  Physical Exam: GEN: NAD, alert and oriented to Place ([facility]), Time (2024), Person (President: Biden), chronically ill in appearance, appears older than stated age HEENT: NCAT, PERRL, EOMI, sclera clear, dry mucous membranes, poor dentition PULM: Diminished breath sounds bilateral bases, crackles right midlung/base, normal respiratory effort without accessory muscle use, on 1 L nasal cannula with SpO2 98% at rest CV: RRR w/o M/G/R GI: abd soft, NTND, NABS, ostomy noted with brown stool in collection  bag MSK: no peripheral edema, moves all extremity independently NEURO: No focal neurological deficits appreciated PSYCH: Depressed mood, flat affect Integumentary: No appreciable rashes/lesions/wounds noted on exposed skin surfaces    Data Reviewed: I have personally reviewed following labs and imaging studies  CBC: Recent Labs  Lab 07/05/22 2334 07/06/22 0538 07/07/22 0401  WBC 12.7* 19.3* 19.8*  NEUTROABS 8.9*  --   --   HGB 11.0* 11.5* 10.1*  HCT 33.6* 35.6* 30.7*  MCV 94.1 93.7 92.7  PLT 271 308 271   Basic Metabolic Panel: Recent Labs  Lab 07/05/22 2334 07/06/22 0538 07/07/22 0401  NA 136 138 134*  K 4.7 4.4 4.0  CL 109 109 103  CO2 20* 20* 21*  GLUCOSE 109* 81 199*  BUN 20 18 12   CREATININE 1.20* 0.87 0.82  CALCIUM 8.6* 8.5* 8.2*  MG  --   --  1.4*  PHOS  --   --  3.2   GFR: Estimated Creatinine Clearance: 51.1 mL/min (by C-G formula based on SCr of 0.82 mg/dL). Liver Function Tests: Recent Labs  Lab 07/05/22 2334  AST 29  ALT 21  ALKPHOS 100  BILITOT 0.4  PROT 7.7  ALBUMIN 2.5*   No results for input(s): "LIPASE", "AMYLASE" in the last 168 hours. Recent Labs  Lab 07/06/22 0634  AMMONIA 53*   Coagulation Profile: Recent Labs  Lab 07/05/22 2334  INR 1.2   Cardiac Enzymes:  No results for input(s): "CKTOTAL", "CKMB", "CKMBINDEX", "TROPONINI" in the last 168 hours. BNP (last 3 results) No results for input(s): "PROBNP" in the last 8760 hours. HbA1C: Recent Labs    07/05/22 2334  HGBA1C 10.9*   CBG: Recent Labs  Lab 07/06/22 2049 07/07/22 0025 07/07/22 0359 07/07/22 0749 07/07/22 1234  GLUCAP 222* 182* 193* 204* 188*   Lipid Profile: No results for input(s): "CHOL", "HDL", "LDLCALC", "TRIG", "CHOLHDL", "LDLDIRECT" in the last 72 hours. Thyroid Function Tests: No results for input(s): "TSH", "T4TOTAL", "FREET4", "T3FREE", "THYROIDAB" in the last 72 hours. Anemia Panel: No results for input(s): "VITAMINB12", "FOLATE",  "FERRITIN", "TIBC", "IRON", "RETICCTPCT" in the last 72 hours. Sepsis Labs: Recent Labs  Lab 07/05/22 2334 07/06/22 0229 07/06/22 0538 07/07/22 0401  LATICACIDVEN 2.0* 2.7* 2.5* 1.4    Recent Results (from the past 240 hour(s))  Blood Culture (routine x 2)     Status: None (Preliminary result)   Collection Time: 07/05/22 11:34 PM   Specimen: BLOOD  Result Value Ref Range Status   Specimen Description   Final    BLOOD BLOOD LEFT FOREARM Performed at [facility], [address], [place], [place] [address]    Special Requests   Final    BOTTLES DRAWN AEROBIC AND ANAEROBIC Blood Culture adequate volume Performed at [facility], [address] [place], [place] [address]    Culture   Final    NO GROWTH 1 DAY Performed at [facility], [address] [place], [place] [address]    Report Status PENDING  Incomplete  Blood Culture (routine x 2)     Status: None (Preliminary result)   Collection Time: 07/05/22 11:41 PM   Specimen: BLOOD  Result Value Ref Range Status   Specimen Description   Final    BLOOD BLOOD RIGHT HAND Performed at [facility], [address], [place], [place] [address]    Special Requests   Final    BOTTLES DRAWN AEROBIC AND ANAEROBIC Blood Culture results may not be optimal due to an inadequate volume of blood received in culture bottles Performed at [facility], [address] [place], [place] [address]    Culture   Final    NO GROWTH 1 DAY Performed at [facility], [address], [place], [place] [address]    Report Status PENDING  Incomplete  Resp panel by RT-PCR (RSV, Flu A&B, Covid) Anterior Nasal Swab     Status: None   Collection Time: 07/06/22 12:20 AM   Specimen: Anterior Nasal Swab  Result Value Ref Range Status   SARS Coronavirus 2 by RT PCR NEGATIVE NEGATIVE Final    Comment: (NOTE) SARS-CoV-2 target nucleic acids are NOT DETECTED.  The  SARS-CoV-2 RNA is generally detectable in upper respiratory specimens during the acute phase of infection. The lowest concentration of SARS-CoV-2 viral copies this assay can detect is 138 copies/mL. A negative result does not preclude SARS-Cov-2 infection and should not be used as the sole basis for treatment or other patient management decisions. A negative result may occur with  improper specimen collection/handling, submission of specimen other than nasopharyngeal swab, presence of viral mutation(s) within the areas targeted by this assay, and inadequate number of viral copies(<138 copies/mL). A negative result must be combined with clinical observations, patient history, and epidemiological information. The expected result is Negative.  Fact Sheet for Patients:  BloggerCourse.com  Fact Sheet for Healthcare Providers:  SeriousBroker.it  This test  is no t yet approved or cleared by the [place] and  has been authorized for detection and/or diagnosis of SARS-CoV-2 by FDA under an Emergency Use Authorization (EUA). This EUA will remain  in effect (meaning this test can be used) for the duration of the COVID-19 declaration under Section 564(b)(1) of the Act, 21 U.S.C.section 360bbb-3(b)(1), unless the authorization is terminated  or revoked sooner.       Influenza A by PCR NEGATIVE NEGATIVE Final   Influenza B by PCR NEGATIVE NEGATIVE Final    Comment: (NOTE) The Xpert Xpress SARS-CoV-2/FLU/RSV plus assay is intended as an aid in the diagnosis of influenza from Nasopharyngeal swab specimens and should not be used as a sole basis for treatment. Nasal washings and aspirates are unacceptable for Xpert Xpress SARS-CoV-2/FLU/RSV testing.  Fact Sheet for Patients: BloggerCourse.com  Fact Sheet for Healthcare Providers: SeriousBroker.it  This test is not yet approved or  cleared by the [place] FDA and has been authorized for detection and/or diagnosis of SARS-CoV-2 by FDA under an Emergency Use Authorization (EUA). This EUA will remain in effect (meaning this test can be used) for the duration of the COVID-19 declaration under Section 564(b)(1) of the Act, 21 U.S.C. section 360bbb-3(b)(1), unless the authorization is terminated or revoked.     Resp Syncytial Virus by PCR NEGATIVE NEGATIVE Final    Comment: (NOTE) Fact Sheet for Patients: BloggerCourse.com  Fact Sheet for Healthcare Providers: SeriousBroker.it  This test is not yet approved or cleared by the [place] FDA and has been authorized for detection and/or diagnosis of SARS-CoV-2 by FDA under an Emergency Use Authorization (EUA). This EUA will remain in effect (meaning this test can be used) for the duration of the COVID-19 declaration under Section 564(b)(1) of the Act, 21 U.S.C. section 360bbb-3(b)(1), unless the authorization is terminated or revoked.  Performed at [facility], [address], [place], [place] [address]   MRSA Next Gen by PCR, Nasal     Status: None   Collection Time: 07/06/22  7:43 AM   Specimen: Nasal Mucosa; Nasal Swab  Result Value Ref Range Status   MRSA by PCR Next Gen NOT DETECTED NOT DETECTED Final    Comment: (NOTE) The GeneXpert MRSA Assay (FDA approved for NASAL specimens only), is one component of a comprehensive MRSA colonization surveillance program. It is not intended to diagnose MRSA infection nor to guide or monitor treatment for MRSA infections. Test performance is not FDA approved in patients less than 47 years old. Performed at [facility], [address] [place], [place] [address]          Radiology Studies: CT Head Wo Contrast  Result Date: 07/06/2022 CLINICAL DATA:  62 year old female with altered mental status. EXAM: CT HEAD WITHOUT  CONTRAST TECHNIQUE: Contiguous axial images were obtained from the base of the skull through the vertex without intravenous contrast. RADIATION DOSE REDUCTION: This exam was performed according to the departmental dose-optimization program which includes automated exposure control, adjustment of the mA and/or kV according to patient size and/or use of iterative reconstruction technique. COMPARISON:  Head CT 06/23/2022 and earlier. 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 Fairly extensive similar chronic left cerebellar encephalomalacia. No midline shift, ventriculomegaly, mass effect, evidence of mass lesion, intracranial hemorrhage or evidence of cortically based acute infarction. Advanced chronic, Patchy and confluent, bilateral cerebral white matter hypodensity. Small area of chronic cortical  encephalomalacia also in the anterior right MCA territory. Stable gray-white matter differentiation throughout the brain. Vascular: No suspicious intracranial vascular hyperdensity. Calcified atherosclerosis at the skull base. Skull: No acute osseous abnormality identified. Sinuses/Orbits: Visualized paranasal sinuses and mastoids are clear. Other: Visualized orbits and scalp soft tissues are within normal limits. IMPRESSION: 1. No acute intracranial abnormality. 2. Stable non contrast CT appearance of multifocal chronic encephalomalacia, advanced white matter disease. Electronically Signed   By: Odessa Fleming M.D.   On: 07/06/2022 05:19   DG Chest Port 1 View  Result Date: 07/06/2022 CLINICAL DATA:  Altered mental status.  Questionable sepsis EXAM: PORTABLE CHEST 1 VIEW COMPARISON:  Radiograph 06/06/2022 FINDINGS: Stable cardiomediastinal silhouette. Aortic atherosclerotic calcification. New hazy airspace opacity about the right lung medially suspicious for pneumonia. Bibasilar atelectasis/scarring. No pleural  effusion or pneumothorax. No displaced rib fractures. IMPRESSION: New hazy opacity in the right lung medially suspicious for pneumonia. Electronically Signed   By: Minerva Fester M.D.   On: 07/06/2022 00:17        Scheduled Meds:  atorvastatin  40 mg Oral QHS   buPROPion  150 mg Oral Daily   divalproex  500 mg Oral TID   fludrocortisone  0.05 mg Oral Daily   insulin aspart  0-5 Units Subcutaneous QHS   insulin aspart  0-9 Units Subcutaneous TID WC   QUEtiapine  25 mg Oral QHS   Continuous Infusions:  azithromycin 500 mg (07/06/22 1514)   ceFEPime (MAXIPIME) IV 2 g (07/07/22 1026)   lactated ringers 75 mL/hr at 07/07/22 0443     LOS: 1 day    Time spent: 51 minutes spent on chart review, discussion with nursing staff, consultants, updating family and interview/physical exam; more than 50% of that time was spent in counseling and/or coordination of care.    Alvira Philips [place], DO [facility] Available via Epic secure chat 7am-7pm After these hours, please refer to coverage provider listed on [email or web address] 07/07/2022, 12:53 PM

## 2022-07-08 DIAGNOSIS — A419 Sepsis, unspecified organism: Secondary | ICD-10-CM | POA: Diagnosis not present

## 2022-07-08 DIAGNOSIS — J189 Pneumonia, unspecified organism: Secondary | ICD-10-CM | POA: Diagnosis not present

## 2022-07-08 DIAGNOSIS — R6520 Severe sepsis without septic shock: Secondary | ICD-10-CM | POA: Diagnosis not present

## 2022-07-08 LAB — CBC
HCT: 31.1 % — ABNORMAL LOW (ref 36.0–46.0)
Hemoglobin: 10.5 g/dL — ABNORMAL LOW (ref 12.0–15.0)
MCH: 30.3 pg (ref 26.0–34.0)
MCHC: 33.8 g/dL (ref 30.0–36.0)
MCV: 89.6 fL (ref 80.0–100.0)
Platelets: 323 10*3/uL (ref 150–400)
RBC: 3.47 MIL/uL — ABNORMAL LOW (ref 3.87–5.11)
RDW: 19.8 % — ABNORMAL HIGH (ref 11.5–15.5)
WBC: 14.8 10*3/uL — ABNORMAL HIGH (ref 4.0–10.5)
nRBC: 0 % (ref 0.0–0.2)

## 2022-07-08 LAB — BASIC METABOLIC PANEL
Anion gap: 11 (ref 5–15)
BUN: 9 mg/dL (ref 8–23)
CO2: 27 mmol/L (ref 22–32)
Calcium: 8.2 mg/dL — ABNORMAL LOW (ref 8.9–10.3)
Chloride: 96 mmol/L — ABNORMAL LOW (ref 98–111)
Creatinine, Ser: 0.66 mg/dL (ref 0.44–1.00)
GFR, Estimated: >60 mL/min (ref >60)
Glucose, Bld: 133 mg/dL — ABNORMAL HIGH (ref 70–99)
Potassium: 4.3 mmol/L (ref 3.5–5.1)
Sodium: 134 mmol/L — ABNORMAL LOW (ref 135–145)

## 2022-07-08 LAB — GLUCOSE, CAPILLARY
Glucose-Capillary: 140 mg/dL — ABNORMAL HIGH (ref 70–99)
Glucose-Capillary: 117 mg/dL — ABNORMAL HIGH (ref 70–99)
Glucose-Capillary: 121 mg/dL — ABNORMAL HIGH (ref 70–99)
Glucose-Capillary: 79 mg/dL (ref 70–99)

## 2022-07-08 LAB — MAGNESIUM: Magnesium: 1.8 mg/dL (ref 1.7–2.4)

## 2022-07-08 NOTE — Progress Notes (Signed)
PROGRESS NOTE    Brittany [name]  UJW:[id number] DOB: 1961-02-10 DOA: 07/05/2022 PCP: Jarrett Soho, NP    Brief Narrative:   Brittany [name] is a 62 y.o. female with past medical history significant for dementia, type 2 diabetes mellitus, HLD, HTN, lupus, history of pancreatic mass, seizure disorder, CVA, RLS, history of colectomy with colostomy, history of recurrent C. difficile colitis, orthostatic hypotension, chronic diastolic congestive heart failure, depression/anxiety, dementia who presented to [facility] ED via EMS from [facility] for evaluation of altered mental status.  Nursing staff reported that patient was baseline at 6 AM and then her nurse returned in the evening and was found confused.  They reportedly had to feed the patient today in which she was not talking, unable to ambulate in which at baseline she is able to feed herself and normally walk without much issue.  On EMS arrival, patient was noted to be febrile with rectal temperature 103.9 F with, and tachycardic into the 110s, hypotensive with systolic blood pressures in the 80s with O2 saturation 80% on room air.  Patient was placed on 3 L nasal cannula and transported to the ED for further evaluation.  In the ED, temperature 103.9 F, HR 111, RR 16, BP 86/71, SpO2 85% on room air.  WBC 12.7, hemoglobin 11.0, platelets 271.  Sodium 136, potassium 4.7, chloride 109, CO2 20, glucose 109, BUN 20, creatinine 1.20.  AST 29, ALT 21, total bilirubin 0.4.  Ammonia level 53.  Lactic acid 2.0.  INR 1.2.  Urinalysis unrevealing.  COVID/influenza/RSV PCR negative.  CT head without contrast with no acute intracranial abnormality, stable appearance of multifocal chronic encephalomalacia, advanced white matter disease.  Chest x-ray with new hazy opacity right lung suspicious for pneumonia.  EDP administered Tylenol, vancomycin, cefepime, metronidazole and greater than 1.75 L IV fluid bolus.  Patient remained hypotensive  with SBP in the 80s and ED physician discussed with on-call critical care who felt patient has low blood pressure at baseline and ICU admission/pressures not indicated at this time.  TRH consulted for admission.  Assessment & Plan:   Severe sepsis, POA Community acquired pneumonia Acute metabolic encephalopathy Lactic acidosis Patient presenting from SNF after being found altered.  On EMS arrival, patient was hypotensive, tachycardic, hypoxic.  WBC count elevated 12.7, lactic acid 2.0, chest x-ray with right lung consolidation/opacity.  CT head unrevealing.  COVID/RSV/influenza PCR negative.  Blood pressure responded to IV fluid resuscitation.  MRSA PCR negative. -- WBC 12.7>19.3>19.8>14.8 -- Lactic acid 2.0>2.7>2.5>1.4 -- Blood cultures x 2: No growth x 2 days -- Azithromycin 500 mg IV every 24 hours -- Cefepime 2 g IV every 12 hours -- DuoNeb q6h PRN Wheezing/shortness of breath -- Continue supplemental oxygen, maintain SpO2 > 92%, oxygen now weaned off.  Hypomagnesemia Repleted.  Magnesium 1.8 this morning. -- Continue monitor electrolytes daily  Type 2 diabetes mellitus Hemoglobin A1c 10.9, poorly controlled.  Home regimen includes Levemir 8 units [place] BID --Semglee 5 units [place] daily -- SSI for coverage -- CBGs QAC/HS  Essential hypertension Chronic diastolic congestive heart failure Home regimen includes Lasix 20 mg p.o. daily -- Hold Lasix secondary to hypotension -- Strict I's and O's and daily weights -- Monitor BP closely  Hyperlipidemia -- Atorvastatin 40 mg p.o. daily  Seizure disorder -- Depakote 500 mg p.o. 3 times daily  History of colectomy with colostomy -- Wound care RN consulted/following for continued management of ostomy  Depression/anxiety Dementia -- Wellbutrin 150 mg p.o. daily --  Seroquel 25 mg p.o. nightly  Orthostatic hypotension --Continue fludrocortisone 0.05 mg p.o. daily  Weakness/debility/deconditioning/gait disturbance: Currently  resides at [facility] [place] -- PT/OT evaluation following, plan to return to SNF once medically stable   DVT prophylaxis: SCDs Start: 07/06/22 0500    Code Status: Full Code Family Communication: No family present at bedside this morning  Disposition Plan:  Level of care: Progressive Status is: Inpatient Remains inpatient appropriate because: IV antibiotics    Consultants:  None  Procedures:  None  Antimicrobials:  Vancomycin 5/23 - 5/23 Metronidazole 5/23 - 5/24 Cefepime 5/23>> Azithromycin 5/24>>  Subjective: Patient seen examined bedside, resting comfortably.  Lying in bed.  Much more alert today.  Tmax 100.6 past 24 hours, much improved.  Remains on IV antibiotics.  No family present at bedside. No other specific complaints this morning.  Denies headache, no chest pain, no abdominal pain, no nausea/vomiting/diarrhea.  No acute concerns overnight per nursing staff.  Objective: Vitals:   07/07/22 1100 07/07/22 1326 07/07/22 2237 07/08/22 0531  BP: 116/78 (!) 119/101 (!) 142/110 130/83  Pulse: (!) 111 79 75 81  Resp:  19 17 18   Temp:  97.7 F (36.5 C) 97.8 F (36.6 C) 99.1 F (37.3 C)  TempSrc:  Oral Oral Oral  SpO2:  94% 97% 91%  Weight:      Height:        Intake/Output Summary (Last 24 hours) at 07/08/2022 1028 Last data filed at 07/08/2022 4098 Gross per 24 hour  Intake 2206.12 ml  Output 1100 ml  Net 1106.12 ml   Filed Weights   07/05/22 2320  Weight: 54.4 kg    Examination:  Physical Exam: GEN: NAD, alert and oriented to Place ([facility]), Time (2024), Person (President: Biden), chronically ill in appearance, appears older than stated age HEENT: NCAT, PERRL, EOMI, sclera clear, dry mucous membranes, poor dentition PULM: Diminished breath sounds bilateral bases, crackles right midlung/base, normal respiratory effort without accessory muscle use, on room air at rest CV: RRR w/o M/G/R GI: abd soft, NTND, NABS, ostomy noted with brown stool in  collection bag MSK: no peripheral edema, moves all extremity independently NEURO: No focal neurological deficits appreciated PSYCH: Depressed mood, flat affect Integumentary: No appreciable rashes/lesions/wounds noted on exposed skin surfaces    Data Reviewed: I have personally reviewed following labs and imaging studies  CBC: Recent Labs  Lab 07/05/22 2334 07/06/22 0538 07/07/22 0401 07/08/22 0405  WBC 12.7* 19.3* 19.8* 14.8*  NEUTROABS 8.9*  --   --   --   HGB 11.0* 11.5* 10.1* 10.5*  HCT 33.6* 35.6* 30.7* 31.1*  MCV 94.1 93.7 92.7 89.6  PLT 271 308 271 323   Basic Metabolic Panel: Recent Labs  Lab 07/05/22 2334 07/06/22 0538 07/07/22 0401 07/08/22 0405  NA 136 138 134* 134*  K 4.7 4.4 4.0 4.3  CL 109 109 103 96*  CO2 20* 20* 21* 27  GLUCOSE 109* 81 199* 133*  BUN 20 18 12 9   CREATININE 1.20* 0.87 0.82 0.66  CALCIUM 8.6* 8.5* 8.2* 8.2*  MG  --   --  1.4* 1.8  PHOS  --   --  3.2  --    GFR: Estimated Creatinine Clearance: 52.4 mL/min (by C-G formula based on SCr of 0.66 mg/dL). Liver Function Tests: Recent Labs  Lab 07/05/22 2334  AST 29  ALT 21  ALKPHOS 100  BILITOT 0.4  PROT 7.7  ALBUMIN 2.5*   No results for input(s): "LIPASE", "AMYLASE" in the last  168 hours. Recent Labs  Lab 07/06/22 0634  AMMONIA 53*   Coagulation Profile: Recent Labs  Lab 07/05/22 2334  INR 1.2   Cardiac Enzymes: No results for input(s): "CKTOTAL", "CKMB", "CKMBINDEX", "TROPONINI" in the last 168 hours. BNP (last 3 results) No results for input(s): "PROBNP" in the last 8760 hours. HbA1C: Recent Labs    07/05/22 2334  HGBA1C 10.9*   CBG: Recent Labs  Lab 07/07/22 0749 07/07/22 1234 07/07/22 1658 07/07/22 2309 07/08/22 0720  GLUCAP 204* 188* 166* 124* 140*   Lipid Profile: No results for input(s): "CHOL", "HDL", "LDLCALC", "TRIG", "CHOLHDL", "LDLDIRECT" in the last 72 hours. Thyroid Function Tests: No results for input(s): "TSH", "T4TOTAL", "FREET4",  "T3FREE", "THYROIDAB" in the last 72 hours. Anemia Panel: No results for input(s): "VITAMINB12", "FOLATE", "FERRITIN", "TIBC", "IRON", "RETICCTPCT" in the last 72 hours. Sepsis Labs: Recent Labs  Lab 07/05/22 2334 07/06/22 0229 07/06/22 0538 07/07/22 0401  LATICACIDVEN 2.0* 2.7* 2.5* 1.4    Recent Results (from the past 240 hour(s))  Blood Culture (routine x 2)     Status: None (Preliminary result)   Collection Time: 07/05/22 11:34 PM   Specimen: BLOOD  Result Value Ref Range Status   Specimen Description   Final    BLOOD BLOOD LEFT FOREARM Performed at [facility], [address] [place], [place] [address]    Special Requests   Final    BOTTLES DRAWN AEROBIC AND ANAEROBIC Blood Culture adequate volume Performed at [facility], [address], [place], [place] [address]    Culture   Final    NO GROWTH 2 DAYS Performed at [facility], [address], [place], [place] [address]    Report Status PENDING  Incomplete  Blood Culture (routine x 2)     Status: None (Preliminary result)   Collection Time: 07/05/22 11:41 PM   Specimen: BLOOD  Result Value Ref Range Status   Specimen Description   Final    BLOOD BLOOD RIGHT HAND Performed at [facility], [address], [place], [place] [address]    Special Requests   Final    BOTTLES DRAWN AEROBIC AND ANAEROBIC Blood Culture results may not be optimal due to an inadequate volume of blood received in culture bottles Performed at [facility], [address], [place], [place] [address]    Culture   Final    NO GROWTH 2 DAYS Performed at [facility], [address], [place], [place] [address]    Report Status PENDING  Incomplete  Resp panel by RT-PCR (RSV, Flu A&B, Covid) Anterior Nasal Swab     Status: None   Collection Time: 07/06/22 12:20 AM   Specimen: Anterior Nasal Swab  Result Value Ref Range Status   SARS Coronavirus 2 by RT  PCR NEGATIVE NEGATIVE Final    Comment: (NOTE) SARS-CoV-2 target nucleic acids are NOT DETECTED.  The SARS-CoV-2 RNA is generally detectable in upper respiratory specimens during the acute phase of infection. The lowest concentration of SARS-CoV-2 viral copies this assay can detect is 138 copies/mL. A negative result does not preclude SARS-Cov-2 infection and should not be used as the sole basis for treatment or other patient management decisions. A negative result may occur with  improper specimen collection/handling, submission of specimen other than nasopharyngeal swab, presence of viral mutation(s) within the areas targeted by this assay, and inadequate number of viral copies(<138 copies/mL). A negative result must be combined with clinical observations,  patient history, and epidemiological information. The expected result is Negative.  Fact Sheet for Patients:  BloggerCourse.com  Fact Sheet for Healthcare Providers:  SeriousBroker.it  This test is no t yet approved or cleared by the [place] FDA and  has been authorized for detection and/or diagnosis of SARS-CoV-2 by FDA under an Emergency Use Authorization (EUA). This EUA will remain  in effect (meaning this test can be used) for the duration of the COVID-19 declaration under Section 564(b)(1) of the Act, 21 U.S.C.section 360bbb-3(b)(1), unless the authorization is terminated  or revoked sooner.       Influenza A by PCR NEGATIVE NEGATIVE Final   Influenza B by PCR NEGATIVE NEGATIVE Final    Comment: (NOTE) The Xpert Xpress SARS-CoV-2/FLU/RSV plus assay is intended as an aid in the diagnosis of influenza from Nasopharyngeal swab specimens and should not be used as a sole basis for treatment. Nasal washings and aspirates are unacceptable for Xpert Xpress SARS-CoV-2/FLU/RSV testing.  Fact Sheet for Patients: BloggerCourse.com  Fact Sheet for  Healthcare Providers: SeriousBroker.it  This test is not yet approved or cleared by the [place] FDA and has been authorized for detection and/or diagnosis of SARS-CoV-2 by FDA under an Emergency Use Authorization (EUA). This EUA will remain in effect (meaning this test can be used) for the duration of the COVID-19 declaration under Section 564(b)(1) of the Act, 21 U.S.C. section 360bbb-3(b)(1), unless the authorization is terminated or revoked.     Resp Syncytial Virus by PCR NEGATIVE NEGATIVE Final    Comment: (NOTE) Fact Sheet for Patients: BloggerCourse.com  Fact Sheet for Healthcare Providers: SeriousBroker.it  This test is not yet approved or cleared by the [place] FDA and has been authorized for detection and/or diagnosis of SARS-CoV-2 by FDA under an Emergency Use Authorization (EUA). This EUA will remain in effect (meaning this test can be used) for the duration of the COVID-19 declaration under Section 564(b)(1) of the Act, 21 U.S.C. section 360bbb-3(b)(1), unless the authorization is terminated or revoked.  Performed at [facility], [address] [place], [place], [place] [address]   MRSA Next Gen by PCR, Nasal     Status: None   Collection Time: 07/06/22  7:43 AM   Specimen: Nasal Mucosa; Nasal Swab  Result Value Ref Range Status   MRSA by PCR Next Gen NOT DETECTED NOT DETECTED Final    Comment: (NOTE) The GeneXpert MRSA Assay (FDA approved for NASAL specimens only), is one component of a comprehensive MRSA colonization surveillance program. It is not intended to diagnose MRSA infection nor to guide or monitor treatment for MRSA infections. Test performance is not FDA approved in patients less than 59 years old. Performed at [facility], [address], [place], [place] [address]          Radiology Studies: No results  found.      Scheduled Meds:  atorvastatin  40 mg Oral QHS   azithromycin  500 mg Oral Daily   buPROPion  150 mg Oral Daily   divalproex  500 mg Oral TID   fludrocortisone  0.05 mg Oral Daily   insulin aspart  0-5 Units Subcutaneous QHS   insulin aspart  0-9 Units Subcutaneous TID WC   insulin glargine-yfgn  5 Units Subcutaneous Daily   QUEtiapine  25 mg Oral QHS   Continuous Infusions:  ceFEPime (MAXIPIME) IV 2 g (07/07/22 2313)   lactated ringers 75 mL/hr at 07/08/22 0048     LOS: 2 days  Time spent: 51 minutes spent on chart review, discussion with nursing staff, consultants, updating family and interview/physical exam; more than 50% of that time was spent in counseling and/or coordination of care.    Alvira Philips [place], DO [facility] Available via Epic secure chat 7am-7pm After these hours, please refer to coverage provider listed on [email or web address] 07/08/2022, 10:28 AM

## 2022-07-08 NOTE — Progress Notes (Signed)
Pt care assumed at 1500 this shift. I have reviewed the previous RN's assessment and agree with their findings.

## 2022-07-09 DIAGNOSIS — J189 Pneumonia, unspecified organism: Secondary | ICD-10-CM | POA: Diagnosis not present

## 2022-07-09 LAB — CBC
HCT: 32.6 % — ABNORMAL LOW (ref 36.0–46.0)
Hemoglobin: 11 g/dL — ABNORMAL LOW (ref 12.0–15.0)
MCH: 30.6 pg (ref 26.0–34.0)
MCHC: 33.7 g/dL (ref 30.0–36.0)
MCV: 90.8 fL (ref 80.0–100.0)
Platelets: 353 10*3/uL (ref 150–400)
RBC: 3.59 MIL/uL — ABNORMAL LOW (ref 3.87–5.11)
RDW: 19.7 % — ABNORMAL HIGH (ref 11.5–15.5)
WBC: 13.4 10*3/uL — ABNORMAL HIGH (ref 4.0–10.5)
nRBC: 0.3 % — ABNORMAL HIGH (ref 0.0–0.2)

## 2022-07-09 LAB — GLUCOSE, CAPILLARY
Glucose-Capillary: 104 mg/dL — ABNORMAL HIGH (ref 70–99)
Glucose-Capillary: 106 mg/dL — ABNORMAL HIGH (ref 70–99)
Glucose-Capillary: 113 mg/dL — ABNORMAL HIGH (ref 70–99)
Glucose-Capillary: 151 mg/dL — ABNORMAL HIGH (ref 70–99)
Glucose-Capillary: 162 mg/dL — ABNORMAL HIGH (ref 70–99)
Glucose-Capillary: 89 mg/dL (ref 70–99)

## 2022-07-09 LAB — BASIC METABOLIC PANEL
Anion gap: 9 (ref 5–15)
BUN: 10 mg/dL (ref 8–23)
CO2: 30 mmol/L (ref 22–32)
Calcium: 8.3 mg/dL — ABNORMAL LOW (ref 8.9–10.3)
Chloride: 99 mmol/L (ref 98–111)
Creatinine, Ser: 0.75 mg/dL (ref 0.44–1.00)
GFR, Estimated: >60 mL/min (ref >60)
Glucose, Bld: 100 mg/dL — ABNORMAL HIGH (ref 70–99)
Potassium: 3.2 mmol/L — ABNORMAL LOW (ref 3.5–5.1)
Sodium: 138 mmol/L (ref 135–145)

## 2022-07-09 LAB — CULTURE, BLOOD (ROUTINE X 2)

## 2022-07-09 LAB — MAGNESIUM: Magnesium: 1.8 mg/dL (ref 1.7–2.4)

## 2022-07-09 MED ORDER — POTASSIUM CHLORIDE CRYS ER 20 MEQ PO TBCR
40.0000 meq | EXTENDED_RELEASE_TABLET | ORAL | Status: AC
  Administered 2022-07-09 (×2): 40 meq via ORAL
  Filled 2022-07-09 (×2): qty 2

## 2022-07-09 MED ORDER — MAGNESIUM SULFATE 2 GM/50ML IV SOLN
2.0000 g | Freq: Once | INTRAVENOUS | Status: AC
  Administered 2022-07-09: 2 g via INTRAVENOUS
  Filled 2022-07-09: qty 50

## 2022-07-09 MED ORDER — ORAL CARE MOUTH RINSE: 15.0000 mL | OROMUCOSAL | Status: DC | PRN

## 2022-07-09 MED ORDER — FUROSEMIDE 20 MG PO TABS
20.0000 mg | ORAL_TABLET | Freq: Every day | ORAL | Status: DC
  Administered 2022-07-09 – 2022-07-10 (×2): 20 mg via ORAL
  Filled 2022-07-09 (×2): qty 1
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
## 2022-07-09 NOTE — Progress Notes (Signed)
PROGRESS NOTE    Brittany [name]  ZOX:[id number] DOB: 11/11/1960 DOA: 07/05/2022 PCP: Jarrett Soho, NP    Brief Narrative:   Brittany [name] is a 62 y.o. female with past medical history significant for dementia, type 2 diabetes mellitus, HLD, HTN, lupus, history of pancreatic mass, seizure disorder, CVA, RLS, history of colectomy with colostomy, history of recurrent C. difficile colitis, orthostatic hypotension, chronic diastolic congestive heart failure, depression/anxiety, dementia who presented to [facility] ED via EMS from [facility] for evaluation of altered mental status.  Nursing staff reported that patient was baseline at 6 AM and then her nurse returned in the evening and was found confused.  They reportedly had to feed the patient today in which she was not talking, unable to ambulate in which at baseline she is able to feed herself and normally walk without much issue.  On EMS arrival, patient was noted to be febrile with rectal temperature 103.9 F with, and tachycardic into the 110s, hypotensive with systolic blood pressures in the 80s with O2 saturation 80% on room air.  Patient was placed on 3 L nasal cannula and transported to the ED for further evaluation.  In the ED, temperature 103.9 F, HR 111, RR 16, BP 86/71, SpO2 85% on room air.  WBC 12.7, hemoglobin 11.0, platelets 271.  Sodium 136, potassium 4.7, chloride 109, CO2 20, glucose 109, BUN 20, creatinine 1.20.  AST 29, ALT 21, total bilirubin 0.4.  Ammonia level 53.  Lactic acid 2.0.  INR 1.2.  Urinalysis unrevealing.  COVID/influenza/RSV PCR negative.  CT head without contrast with no acute intracranial abnormality, stable appearance of multifocal chronic encephalomalacia, advanced white matter disease.  Chest x-ray with new hazy opacity right lung suspicious for pneumonia.  EDP administered Tylenol, vancomycin, cefepime, metronidazole and greater than 1.75 L IV fluid bolus.  Patient remained hypotensive  with SBP in the 80s and ED physician discussed with on-call critical care who felt patient has low blood pressure at baseline and ICU admission/pressures not indicated at this time.  TRH consulted for admission.  Assessment & Plan:   Severe sepsis, POA Community acquired pneumonia Acute metabolic encephalopathy Lactic acidosis Patient presenting from SNF after being found altered.  On EMS arrival, patient was hypotensive, tachycardic, hypoxic.  WBC count elevated 12.7, lactic acid 2.0, chest x-ray with right lung consolidation/opacity.  CT head unrevealing.  COVID/RSV/influenza PCR negative.  Blood pressure responded to IV fluid resuscitation.  MRSA PCR negative. -- WBC 12.7>19.3>19.8>14.8>13.4 -- Lactic acid 2.0>2.7>2.5>1.4 -- Blood cultures x 2: No growth x 3 days -- Azithromycin 500 mg IV every 24 hours -- Cefepime 2 g IV every 12 hours -- DuoNeb q6h PRN Wheezing/shortness of breath -- Continue supplemental oxygen, maintain SpO2 > 92%, oxygen now weaned off.  Hypomagnesemia Repleted.  Magnesium 1.8 this morning. -- Continue monitor electrolytes daily  Hypokalemia Potassium 3.2 this morning, will replete. -- Repeat electrolytes in a.m.  Type 2 diabetes mellitus Hemoglobin A1c 10.9, poorly controlled.  Home regimen includes Levemir 8 units [place] BID --Semglee 5 units  daily -- SSI for coverage -- CBGs QAC/HS  Essential hypertension Chronic diastolic congestive heart failure Home regimen includes Lasix 20 mg p.o. daily -- Resume home Lasix -- Strict I's and O's and daily weights -- Monitor BP closely  Hyperlipidemia -- Atorvastatin 40 mg p.o. daily  Seizure disorder -- Depakote 500 mg p.o. 3 times daily  History of colectomy with colostomy -- Wound care RN consulted/following for continued management  of ostomy  Depression/anxiety Dementia -- Wellbutrin 150 mg p.o. daily -- Seroquel 25 mg p.o. nightly  Orthostatic hypotension --Continue fludrocortisone 0.05 mg p.o.  daily  Weakness/debility/deconditioning/gait disturbance: Currently resides at [facility], [place] -- PT/OT evaluation following, plan to return to SNF likely tomorrow   DVT prophylaxis: SCDs Start: 07/06/22 0500    Code Status: Full Code Family Communication: No family present at bedside this morning  Disposition Plan:  Level of care: Med-Surg Status is: Inpatient Remains inpatient appropriate because: IV antibiotics    Consultants:  None  Procedures:  None  Antimicrobials:  Vancomycin 5/23 - 5/23 Metronidazole 5/23 - 5/24 Cefepime 5/23>> Azithromycin 5/24>>  Subjective: Patient seen examined bedside, resting comfortably.  Lying in bed.  Afebrile now past 24 hours..  Remains on IV antibiotics.  No family present at bedside. No other specific complaints this morning.  Denies headache, no chest pain, no abdominal pain, no nausea/vomiting/diarrhea.  No acute concerns overnight per nursing staff.  Anticipate likely will be able to return to SNF tomorrow.  Objective: Vitals:   07/08/22 0531 07/08/22 1352 07/08/22 2121 07/09/22 0456  BP: 130/83 136/89 (!) 130/94 (!) 123/90  Pulse: 81 95 93 88  Resp: 18 14 16 12   Temp: 99.1 F (37.3 C) (!) 97.4 F (36.3 C) 98.7 F (37.1 C) 98 F (36.7 C)  TempSrc: Oral Axillary Oral Oral  SpO2: 91% 94% 92% 93%  Weight:      Height:        Intake/Output Summary (Last 24 hours) at 07/09/2022 1203 Last data filed at 07/09/2022 0533 Gross per 24 hour  Intake 1833.91 ml  Output 1350 ml  Net 483.91 ml   Filed Weights   07/05/22 2320  Weight: 54.4 kg    Examination:  Physical Exam: GEN: NAD, alert and oriented to Place ([facility]), Time (2024), Person (President: Biden), chronically ill in appearance, appears older than stated age HEENT: NCAT, PERRL, EOMI, sclera clear, dry mucous membranes, poor dentition PULM: Diminished breath sounds bilateral bases, crackles right midlung/base, normal respiratory effort without accessory  muscle use, on room air at rest CV: RRR w/o M/G/R GI: abd soft, NTND, NABS, ostomy noted with brown stool in collection bag MSK: no peripheral edema, moves all extremity independently NEURO: No focal neurological deficits appreciated PSYCH: Depressed mood, flat affect Integumentary: No appreciable rashes/lesions/wounds noted on exposed skin surfaces    Data Reviewed: I have personally reviewed following labs and imaging studies  CBC: Recent Labs  Lab 07/05/22 2334 07/06/22 0538 07/07/22 0401 07/08/22 0405 07/09/22 0402  WBC 12.7* 19.3* 19.8* 14.8* 13.4*  NEUTROABS 8.9*  --   --   --   --   HGB 11.0* 11.5* 10.1* 10.5* 11.0*  HCT 33.6* 35.6* 30.7* 31.1* 32.6*  MCV 94.1 93.7 92.7 89.6 90.8  PLT 271 308 271 323 353   Basic Metabolic Panel: Recent Labs  Lab 07/05/22 2334 07/06/22 0538 07/07/22 0401 07/08/22 0405 07/09/22 0402  NA 136 138 134* 134* 138  K 4.7 4.4 4.0 4.3 3.2*  CL 109 109 103 96* 99  CO2 20* 20* 21* 27 30  GLUCOSE 109* 81 199* 133* 100*  BUN 20 18 12 9 10   CREATININE 1.20* 0.87 0.82 0.66 0.75  CALCIUM 8.6* 8.5* 8.2* 8.2* 8.3*  MG  --   --  1.4* 1.8 1.8  PHOS  --   --  3.2  --   --    GFR: Estimated Creatinine Clearance: 52.4 mL/min (by C-G formula based on  SCr of 0.75 mg/dL). Liver Function Tests: Recent Labs  Lab 07/05/22 2334  AST 29  ALT 21  ALKPHOS 100  BILITOT 0.4  PROT 7.7  ALBUMIN 2.5*   No results for input(s): "LIPASE", "AMYLASE" in the last 168 hours. Recent Labs  Lab 07/06/22 0634  AMMONIA 53*   Coagulation Profile: Recent Labs  Lab 07/05/22 2334  INR 1.2   Cardiac Enzymes: No results for input(s): "CKTOTAL", "CKMB", "CKMBINDEX", "TROPONINI" in the last 168 hours. BNP (last 3 results) No results for input(s): "PROBNP" in the last 8760 hours. HbA1C: No results for input(s): "HGBA1C" in the last 72 hours.  CBG: Recent Labs  Lab 07/08/22 1958 07/09/22 0010 07/09/22 0324 07/09/22 0740 07/09/22 1145  GLUCAP 79 113*  104* 89 106*   Lipid Profile: No results for input(s): "CHOL", "HDL", "LDLCALC", "TRIG", "CHOLHDL", "LDLDIRECT" in the last 72 hours. Thyroid Function Tests: No results for input(s): "TSH", "T4TOTAL", "FREET4", "T3FREE", "THYROIDAB" in the last 72 hours. Anemia Panel: No results for input(s): "VITAMINB12", "FOLATE", "FERRITIN", "TIBC", "IRON", "RETICCTPCT" in the last 72 hours. Sepsis Labs: Recent Labs  Lab 07/05/22 2334 07/06/22 0229 07/06/22 0538 07/07/22 0401  LATICACIDVEN 2.0* 2.7* 2.5* 1.4    Recent Results (from the past 240 hour(s))  Blood Culture (routine x 2)     Status: None (Preliminary result)   Collection Time: 07/05/22 11:34 PM   Specimen: BLOOD  Result Value Ref Range Status   Specimen Description   Final    BLOOD BLOOD LEFT FOREARM Performed at [facility], [address], [place], [place] [address]    Special Requests   Final    BOTTLES DRAWN AEROBIC AND ANAEROBIC Blood Culture adequate volume Performed at [facility], [address] [place], [place] [address]    Culture   Final    NO GROWTH 3 DAYS Performed at [facility], [address], [place], [place] [address]    Report Status PENDING  Incomplete  Blood Culture (routine x 2)     Status: None (Preliminary result)   Collection Time: 07/05/22 11:41 PM   Specimen: BLOOD  Result Value Ref Range Status   Specimen Description   Final    BLOOD BLOOD RIGHT HAND Performed at [facility], [address], [place], [place] [address]    Special Requests   Final    BOTTLES DRAWN AEROBIC AND ANAEROBIC Blood Culture results may not be optimal due to an inadequate volume of blood received in culture bottles Performed at [facility], [address], [place], [place] [address]    Culture   Final    NO GROWTH 3 DAYS Performed at [facility], [address], [place], [place] [address]    Report Status PENDING  Incomplete   Resp panel by RT-PCR (RSV, Flu A&B, Covid) Anterior Nasal Swab     Status: None   Collection Time: 07/06/22 12:20 AM   Specimen: Anterior Nasal Swab  Result Value Ref Range Status   SARS Coronavirus 2 by RT PCR NEGATIVE NEGATIVE Final    Comment: (NOTE) SARS-CoV-2 target nucleic acids are NOT DETECTED.  The SARS-CoV-2 RNA is generally detectable in upper respiratory specimens during the acute phase of infection. The lowest concentration of SARS-CoV-2 viral copies this assay can detect is 138 copies/mL. A negative result does not preclude SARS-Cov-2 infection and should not be used as the sole basis for treatment or other patient management decisions. A negative result  may occur with  improper specimen collection/handling, submission of specimen other than nasopharyngeal swab, presence of viral mutation(s) within the areas targeted by this assay, and inadequate number of viral copies(<138 copies/mL). A negative result must be combined with clinical observations, patient history, and epidemiological information. The expected result is Negative.  Fact Sheet for Patients:  BloggerCourse.com  Fact Sheet for Healthcare Providers:  SeriousBroker.it  This test is no t yet approved or cleared by the [place] FDA and  has been authorized for detection and/or diagnosis of SARS-CoV-2 by FDA under an Emergency Use Authorization (EUA). This EUA will remain  in effect (meaning this test can be used) for the duration of the COVID-19 declaration under Section 564(b)(1) of the Act, 21 U.S.C.section 360bbb-3(b)(1), unless the authorization is terminated  or revoked sooner.       Influenza A by PCR NEGATIVE NEGATIVE Final   Influenza B by PCR NEGATIVE NEGATIVE Final    Comment: (NOTE) The Xpert Xpress SARS-CoV-2/FLU/RSV plus assay is intended as an aid in the diagnosis of influenza from Nasopharyngeal swab specimens and should not be used  as a sole basis for treatment. Nasal washings and aspirates are unacceptable for Xpert Xpress SARS-CoV-2/FLU/RSV testing.  Fact Sheet for Patients: BloggerCourse.com  Fact Sheet for Healthcare Providers: SeriousBroker.it  This test is not yet approved or cleared by the [place] FDA and has been authorized for detection and/or diagnosis of SARS-CoV-2 by FDA under an Emergency Use Authorization (EUA). This EUA will remain in effect (meaning this test can be used) for the duration of the COVID-19 declaration under Section 564(b)(1) of the Act, 21 U.S.C. section 360bbb-3(b)(1), unless the authorization is terminated or revoked.     Resp Syncytial Virus by PCR NEGATIVE NEGATIVE Final    Comment: (NOTE) Fact Sheet for Patients: BloggerCourse.com  Fact Sheet for Healthcare Providers: SeriousBroker.it  This test is not yet approved or cleared by the [place] FDA and has been authorized for detection and/or diagnosis of SARS-CoV-2 by FDA under an Emergency Use Authorization (EUA). This EUA will remain in effect (meaning this test can be used) for the duration of the COVID-19 declaration under Section 564(b)(1) of the Act, 21 U.S.C. section 360bbb-3(b)(1), unless the authorization is terminated or revoked.  Performed at [facility], [address], [place], [place] [address]   MRSA Next Gen by PCR, Nasal     Status: None   Collection Time: 07/06/22  7:43 AM   Specimen: Nasal Mucosa; Nasal Swab  Result Value Ref Range Status   MRSA by PCR Next Gen NOT DETECTED NOT DETECTED Final    Comment: (NOTE) The GeneXpert MRSA Assay (FDA approved for NASAL specimens only), is one component of a comprehensive MRSA colonization surveillance program. It is not intended to diagnose MRSA infection nor to guide or monitor treatment for MRSA infections. Test performance  is not FDA approved in patients less than 81 years old. Performed at [facility], [address] [place], [place] [address]          Radiology Studies: No results found.      Scheduled Meds:  atorvastatin  40 mg Oral QHS   azithromycin  500 mg Oral Daily   buPROPion  150 mg Oral Daily   divalproex  500 mg Oral TID   fludrocortisone  0.05 mg Oral Daily   insulin aspart  0-5 Units Subcutaneous QHS   insulin aspart  0-9 Units Subcutaneous TID WC   insulin glargine-yfgn  5 Units Subcutaneous Daily   potassium chloride  40 mEq Oral Q3H   QUEtiapine  25 mg Oral QHS   Continuous Infusions:  ceFEPime (MAXIPIME) IV 2 g (07/08/22 2226)   lactated ringers 75 mL/hr at 07/09/22 0533     LOS: 3 days    Time spent: 51 minutes spent on chart review, discussion with nursing staff, consultants, updating family and interview/physical exam; more than 50% of that time was spent in counseling and/or coordination of care.    Alvira Philips [place], DO [facility] Available via Epic secure chat 7am-7pm After these hours, please refer to coverage provider listed on [email or web address] 07/09/2022, 12:03 PM

## 2022-07-09 NOTE — Progress Notes (Signed)
Physical Therapy Treatment Patient Details Name: Brittany [name] MRN: [id number] DOB: [birth year] Today's Date: 07/09/2022   History of Present Illness Patient is a 62 year old female who presented from LTC SNF with AMS. Patient was admitted with severe sepsis, acute metabolic encephalopathy, CAP, lactic acidosis. PMH: seizure disorder, colectomy with colostomy, depression, dementia, anxiety, othostatic    PT Comments     Pt admitted with above diagnosis.  Pt currently with functional limitations due to the deficits listed below (see PT Problem List). Pt in bed resting when PT arrived, pt required cues for initiation, attention and one step multimodal cues t/o intervention. Pt required mod A for supine to sit, mod A x 2 for STS from EOB, min A for initial standing with B UE support at RW, gait tasks 5 feet with RW and min A x 2 for safety, cues and rw management with recliner close. Pt left seated in recliner, all needs in place and pt set up for lunch.  Pt O2 saturation decreased to 87% on RA and quickly recovered to 90% once seated in recliner. Pt will benefit from acute skilled PT to increase their independence and safety with mobility to allow discharge.     Recommendations for follow up therapy are one component of a multi-disciplinary discharge planning process, led by the attending physician.  Recommendations may be updated based on patient status, additional functional criteria and insurance authorization.  Follow Up Recommendations  Can patient physically be transported by private vehicle: No    Assistance Recommended at Discharge Intermittent Supervision/Assistance  Patient can return home with the following Help with stairs or ramp for entrance;Assistance with cooking/housework;Assist for transportation;A lot of help with walking and/or transfers   Equipment Recommendations  None recommended by PT    Recommendations for Other Services       Precautions / Restrictions  Precautions Precautions: Fall Precaution Comments: orthostatic Restrictions Weight Bearing Restrictions: No     Mobility  Bed Mobility Overal bed mobility: Needs Assistance Bed Mobility: Supine to Sit     Supine to sit: Mod assist, HOB elevated     General bed mobility comments: cues for attention and to engage with bed mobility pt required A for trunk and for B LEs to EOB    Transfers Overall transfer level: Needs assistance Equipment used: Rolling walker (2 wheels) Transfers: Sit to/from Stand Sit to Stand: Mod assist, +2 physical assistance           General transfer comment: pt able to follow multimodal cues for proper UE placement, assist to power up and for initial standing balance    Ambulation/Gait Ambulation/Gait assistance: Min assist, +2 physical assistance, +2 safety/equipment Gait Distance (Feet): 5 Feet Assistive device: Rolling walker (2 wheels) Gait Pattern/deviations: Shuffle, Step-to pattern Gait velocity: decreased     General Gait Details: cues for attention and continued anterior movement with assist to [employer] RW and recliner close   Stairs             Wheelchair Mobility    Modified Rankin (Stroke Patients Only)       Balance Overall balance assessment: Needs assistance Sitting-balance support: Feet supported, Single extremity supported, No upper extremity supported Sitting balance-Leahy Scale: Fair     Standing balance support: Bilateral upper extremity supported, Reliant on assistive device for balance Standing balance-Leahy Scale: Poor Standing balance comment: pt required min A for initial standing balance cues for attention and extension posture  Cognition Arousal/Alertness: Lethargic Behavior During Therapy: Flat affect Overall Cognitive Status: No family/caregiver present to determine baseline cognitive functioning                                 General  Comments: pt pleasant, difficulty processing at times -unsure if this is baseline, follows commands with incr time        Exercises      General Comments        Pertinent Vitals/Pain Pain Assessment Pain Assessment: No/denies pain Pain Intervention(s): Monitored during session    Home Living Family/patient expects to be discharged to:: Skilled nursing facility                   Additional Comments: Per patient, living at [facility] (for approx six months)    Prior Function            PT Goals (current goals can now be found in the care plan section) Acute Rehab PT Goals PT Goal Formulation: Patient unable to participate in goal setting Time For Goal Achievement: 07/27/22 Potential to Achieve Goals: Fair    Frequency    Min 1X/week      PT Plan      Co-evaluation              AM-PAC PT "6 Clicks" Mobility   Outcome Measure  Help needed turning from your back to your side while in a flat bed without using bedrails?: A Little Help needed moving from lying on your back to sitting on the side of a flat bed without using bedrails?: A Little Help needed moving to and from a bed to a chair (including a wheelchair)?: A Lot Help needed standing up from a chair using your arms (e.g., wheelchair or bedside chair)?: A Lot Help needed to walk in hospital room?: A Little Help needed climbing 3-5 steps with a railing? : Total 6 Click Score: 14    End of Session Equipment Utilized During Treatment: Gait belt Activity Tolerance: Other (comment) (attention) Patient left: with call bell/phone within reach;with chair alarm set;in chair Nurse Communication: Mobility status PT Visit Diagnosis: Other abnormalities of gait and mobility (R26.89);Difficulty in walking, not elsewhere classified (R26.2)     Time: 1610-9604 PT Time Calculation (min) (ACUTE ONLY): 17 min  Charges:  $Gait Training: 8-22 mins                     Johnny Bridge, PT Acute  Rehab    Jacqualyn Posey 07/09/2022, 1:13 PM

## 2022-07-09 NOTE — Care Management Important Message (Signed)
Important Message  Patient Details IM Letter given. Name: Brittany [name] MRN: [id number] Date of Birth: 01/10/1961   Medicare Important Message Given:  Yes     Caren Macadam 07/09/2022, 11:51 AM

## 2022-07-10 DIAGNOSIS — J189 Pneumonia, unspecified organism: Secondary | ICD-10-CM | POA: Diagnosis not present

## 2022-07-10 LAB — BASIC METABOLIC PANEL
Anion gap: 8 (ref 5–15)
BUN: 12 mg/dL (ref 8–23)
CO2: 29 mmol/L (ref 22–32)
Calcium: 8.1 mg/dL — ABNORMAL LOW (ref 8.9–10.3)
Chloride: 97 mmol/L — ABNORMAL LOW (ref 98–111)
Creatinine, Ser: 0.81 mg/dL (ref 0.44–1.00)
GFR, Estimated: >60 mL/min (ref >60)
Glucose, Bld: 112 mg/dL — ABNORMAL HIGH (ref 70–99)
Potassium: 4.5 mmol/L (ref 3.5–5.1)
Sodium: 134 mmol/L — ABNORMAL LOW (ref 135–145)

## 2022-07-10 LAB — CBC
HCT: 30.9 % — ABNORMAL LOW (ref 36.0–46.0)
Hemoglobin: 10.5 g/dL — ABNORMAL LOW (ref 12.0–15.0)
MCH: 31 pg (ref 26.0–34.0)
MCHC: 34 g/dL (ref 30.0–36.0)
MCV: 91.2 fL (ref 80.0–100.0)
Platelets: 321 10*3/uL (ref 150–400)
RBC: 3.39 MIL/uL — ABNORMAL LOW (ref 3.87–5.11)
RDW: 20 % — ABNORMAL HIGH (ref 11.5–15.5)
WBC: 10.7 10*3/uL — ABNORMAL HIGH (ref 4.0–10.5)
nRBC: 0.7 % — ABNORMAL HIGH (ref 0.0–0.2)

## 2022-07-10 LAB — GLUCOSE, CAPILLARY
Glucose-Capillary: 104 mg/dL — ABNORMAL HIGH (ref 70–99)
Glucose-Capillary: 126 mg/dL — ABNORMAL HIGH (ref 70–99)
Glucose-Capillary: 310 mg/dL — ABNORMAL HIGH (ref 70–99)
Glucose-Capillary: 296 mg/dL — ABNORMAL HIGH (ref 70–99)

## 2022-07-10 LAB — CULTURE, BLOOD (ROUTINE X 2): Culture: NO GROWTH

## 2022-07-10 LAB — MAGNESIUM: Magnesium: 1.6 mg/dL — ABNORMAL LOW (ref 1.7–2.4)

## 2022-07-10 MED ORDER — MAGNESIUM SULFATE 2 GM/50ML IV SOLN
2.0000 g | Freq: Once | INTRAVENOUS | Status: AC
  Administered 2022-07-10: 2 g via INTRAVENOUS
  Filled 2022-07-10: qty 50

## 2022-07-10 NOTE — TOC Progression Note (Addendum)
Transition of Care [facility]) - Progression Note    Patient Details  Name: Brittany [name] MRN: [id number] Date of Birth: [birth year]  [facility] of [facility]) CM/SW Contact  [name], LCSW Phone Number: 07/10/2022, 9:55 AM  Clinical Narrative:    Pt's insurance auth still pending. TOC to follow.   Adden 4:00pm Pt's [name] was approved for [facility], can d/c back to facility tomorrow. TOC to follow  Expected Discharge Plan: Skilled Nursing Facility Barriers to Discharge: Continued Medical Work up  Expected Discharge Plan and Services       Living arrangements for the past 2 months: Skilled Nursing Facility                                       Social Determinants of Health (SDOH) Interventions SDOH Screenings   Food Insecurity: No Food Insecurity (05/14/2022)  Housing: Low Risk  (05/14/2022)  Transportation Needs: No Transportation Needs (05/14/2022)  Utilities: Not At Risk (05/14/2022)  Tobacco Use: High Risk (07/05/2022)    Readmission Risk Interventions    12/29/2021    1:48 PM 09/22/2021    1:24 PM 03/06/2021    9:13 AM  Readmission Risk Prevention Plan  Transportation Screening Complete Complete Complete  Medication Review Oceanographer) Complete Complete Complete  PCP or Specialist appointment within 3-5 days of discharge  Complete Complete  HRI or Home Care Consult Complete Complete   SW Recovery Care/Counseling Consult Complete Complete   Palliative Care Screening Not Applicable Not Applicable Not Applicable  Skilled Nursing Facility Not Applicable Complete Not Applicable

## 2022-07-10 NOTE — Progress Notes (Signed)
PROGRESS NOTE    Brittany [name]  WNU:[id number] DOB: 1960-10-25 DOA: 07/05/2022 PCP: Jarrett Soho, NP    Brief Narrative:   Brittany [name] is a 62 y.o. female with past medical history significant for dementia, type 2 diabetes mellitus, HLD, HTN, lupus, history of pancreatic mass, seizure disorder, CVA, RLS, history of colectomy with colostomy, history of recurrent C. difficile colitis, orthostatic hypotension, chronic diastolic congestive heart failure, depression/anxiety, dementia who presented to [facility] ED via EMS from [facility] for evaluation of altered mental status.  Nursing staff reported that patient was baseline at 6 AM and then her nurse returned in the evening and was found confused.  They reportedly had to feed the patient today in which she was not talking, unable to ambulate in which at baseline she is able to feed herself and normally walk without much issue.  On EMS arrival, patient was noted to be febrile with rectal temperature 103.9 F with, and tachycardic into the 110s, hypotensive with systolic blood pressures in the 80s with O2 saturation 80% on room air.  Patient was placed on 3 L nasal cannula and transported to the ED for further evaluation.  In the ED, temperature 103.9 F, HR 111, RR 16, BP 86/71, SpO2 85% on room air.  WBC 12.7, hemoglobin 11.0, platelets 271.  Sodium 136, potassium 4.7, chloride 109, CO2 20, glucose 109, BUN 20, creatinine 1.20.  AST 29, ALT 21, total bilirubin 0.4.  Ammonia level 53.  Lactic acid 2.0.  INR 1.2.  Urinalysis unrevealing.  COVID/influenza/RSV PCR negative.  CT head without contrast with no acute intracranial abnormality, stable appearance of multifocal chronic encephalomalacia, advanced white matter disease.  Chest x-ray with new hazy opacity right lung suspicious for pneumonia.  EDP administered Tylenol, vancomycin, cefepime, metronidazole and greater than 1.75 L IV fluid bolus.  Patient remained hypotensive  with SBP in the 80s and ED physician discussed with on-call critical care who felt patient has low blood pressure at baseline and ICU admission/pressures not indicated at this time.  TRH consulted for admission.  Assessment & Plan:   Severe sepsis, POA Community acquired pneumonia Acute metabolic encephalopathy Lactic acidosis Patient presenting from SNF after being found altered.  On EMS arrival, patient was hypotensive, tachycardic, hypoxic.  WBC count elevated 12.7, lactic acid 2.0, chest x-ray with right lung consolidation/opacity.  CT head unrevealing.  COVID/RSV/influenza PCR negative.  Blood pressure responded to IV fluid resuscitation.  MRSA PCR negative. -- WBC 12.7>19.3>19.8>14.8>13.4>10.7 -- Lactic acid 2.0>2.7>2.5>1.4 -- Blood cultures x 2: No growth x 4 days -- Azithromycin 500 mg IV every 24 hours -- Cefepime 2 g IV every 12 hours -- DuoNeb q6h PRN Wheezing/shortness of breath -- Continue supplemental oxygen, maintain SpO2 > 92%, oxygen now weaned off.  Hypomagnesemia Magnesium 1.6 this morning; will replete -- Continue monitor electrolytes daily  Hypokalemia Potassium 4.5 this morning. -- Repeat electrolytes in a.m.  Type 2 diabetes mellitus Hemoglobin A1c 10.9, poorly controlled.  Home regimen includes Levemir 8 units [place] BID -- Semglee 5 units  daily -- SSI for coverage -- CBGs QAC/HS  Essential hypertension Chronic diastolic congestive heart failure Home regimen includes Lasix 20 mg p.o. daily -- Resume home Lasix -- Strict I's and O's and daily weights -- Monitor BP closely  Hyperlipidemia -- Atorvastatin 40 mg p.o. daily  Seizure disorder -- Depakote 500 mg p.o. 3 times daily  History of colectomy with colostomy -- Wound care RN consulted/following for continued management of  ostomy  Depression/anxiety Dementia -- Wellbutrin 150 mg p.o. daily -- Seroquel 25 mg p.o. nightly  Orthostatic hypotension --Continue fludrocortisone 0.05 mg p.o.  daily  Weakness/debility/deconditioning/gait disturbance: Currently resides at [facility] [place] -- PT/OT evaluation following, plan to return to SNF likely tomorrow   DVT prophylaxis: SCDs Start: 07/06/22 0500    Code Status: Full Code Family Communication: No family present at bedside this morning  Disposition Plan:  Level of care: Med-Surg Status is: Inpatient Remains inpatient appropriate because: IV antibiotics    Consultants:  None  Procedures:  None  Antimicrobials:  Vancomycin 5/23 - 5/23 Metronidazole 5/23 - 5/24 Cefepime 5/23>> Azithromycin 5/24>>  Subjective: Patient seen examined bedside, resting comfortably.  Lying in bed.  Remains afebrile.  Continues on IV antibiotics.  No family present at bedside. No specific complaints this morning.  Denies headache, no chest pain, no abdominal pain, no nausea/vomiting/diarrhea.  No acute concerns overnight per nursing staff.  Stable for discharge to SNF, per social work pending insurance authorization.    Objective: Vitals:   07/09/22 0456 07/09/22 1237 07/09/22 2017 07/10/22 0502  BP: (!) 123/90 (!) 138/99 109/89 (!) 138/99  Pulse: 88 93 85 86  Resp: 12 16 18 18   Temp: 98 F (36.7 C) 98.9 F (37.2 C) 98 F (36.7 C) 97.9 F (36.6 C)  TempSrc: Oral Oral  Oral  SpO2: 93% 95% 93% 93%  Weight:      Height:        Intake/Output Summary (Last 24 hours) at 07/10/2022 1121 Last data filed at 07/10/2022 1100 Gross per 24 hour  Intake 2265.69 ml  Output 1475 ml  Net 790.69 ml   Filed Weights   07/05/22 2320  Weight: 54.4 kg    Examination:  Physical Exam: GEN: NAD, alert and oriented to Place ([facility]), Time (2024), Person (President: Biden), chronically ill in appearance, appears older than stated age HEENT: NCAT, PERRL, EOMI, sclera clear, dry mucous membranes, poor dentition PULM: Diminished breath sounds bilateral bases, crackles right midlung/base, normal respiratory effort without accessory muscle  use, on room air at rest CV: RRR w/o M/G/R GI: abd soft, NTND, NABS, ostomy noted with brown stool in collection bag MSK: no peripheral edema, moves all extremity independently NEURO: No focal neurological deficits appreciated PSYCH: Depressed mood, flat affect Integumentary: No appreciable rashes/lesions/wounds noted on exposed skin surfaces    Data Reviewed: I have personally reviewed following labs and imaging studies  CBC: Recent Labs  Lab 07/05/22 2334 07/06/22 0538 07/07/22 0401 07/08/22 0405 07/09/22 0402 07/10/22 0437  WBC 12.7* 19.3* 19.8* 14.8* 13.4* 10.7*  NEUTROABS 8.9*  --   --   --   --   --   HGB 11.0* 11.5* 10.1* 10.5* 11.0* 10.5*  HCT 33.6* 35.6* 30.7* 31.1* 32.6* 30.9*  MCV 94.1 93.7 92.7 89.6 90.8 91.2  PLT 271 308 271 323 353 321   Basic Metabolic Panel: Recent Labs  Lab 07/06/22 0538 07/07/22 0401 07/08/22 0405 07/09/22 0402 07/10/22 0437  NA 138 134* 134* 138 134*  K 4.4 4.0 4.3 3.2* 4.5  CL 109 103 96* 99 97*  CO2 20* 21* 27 30 29   GLUCOSE 81 199* 133* 100* 112*  BUN 18 12 9 10 12   CREATININE 0.87 0.82 0.66 0.75 0.81  CALCIUM 8.5* 8.2* 8.2* 8.3* 8.1*  MG  --  1.4* 1.8 1.8 1.6*  PHOS  --  3.2  --   --   --    GFR: Estimated Creatinine Clearance: 51.7  mL/min (by C-G formula based on SCr of 0.81 mg/dL). Liver Function Tests: Recent Labs  Lab 07/05/22 2334  AST 29  ALT 21  ALKPHOS 100  BILITOT 0.4  PROT 7.7  ALBUMIN 2.5*   No results for input(s): "LIPASE", "AMYLASE" in the last 168 hours. Recent Labs  Lab 07/06/22 0634  AMMONIA 53*   Coagulation Profile: Recent Labs  Lab 07/05/22 2334  INR 1.2   Cardiac Enzymes: No results for input(s): "CKTOTAL", "CKMB", "CKMBINDEX", "TROPONINI" in the last 168 hours. BNP (last 3 results) No results for input(s): "PROBNP" in the last 8760 hours. HbA1C: No results for input(s): "HGBA1C" in the last 72 hours.  CBG: Recent Labs  Lab 07/09/22 0740 07/09/22 1145 07/09/22 1655  07/09/22 2126 07/10/22 0734  GLUCAP 89 106* 151* 162* 104*   Lipid Profile: No results for input(s): "CHOL", "HDL", "LDLCALC", "TRIG", "CHOLHDL", "LDLDIRECT" in the last 72 hours. Thyroid Function Tests: No results for input(s): "TSH", "T4TOTAL", "FREET4", "T3FREE", "THYROIDAB" in the last 72 hours. Anemia Panel: No results for input(s): "VITAMINB12", "FOLATE", "FERRITIN", "TIBC", "IRON", "RETICCTPCT" in the last 72 hours. Sepsis Labs: Recent Labs  Lab 07/05/22 2334 07/06/22 0229 07/06/22 0538 07/07/22 0401  LATICACIDVEN 2.0* 2.7* 2.5* 1.4    Recent Results (from the past 240 hour(s))  Blood Culture (routine x 2)     Status: None (Preliminary result)   Collection Time: 07/05/22 11:34 PM   Specimen: BLOOD  Result Value Ref Range Status   Specimen Description   Final    BLOOD BLOOD LEFT FOREARM Performed at [facility], [address] [place], [place] [address]    Special Requests   Final    BOTTLES DRAWN AEROBIC AND ANAEROBIC Blood Culture adequate volume Performed at [facility], [address] [place], [place] [address]    Culture   Final    NO GROWTH 4 DAYS Performed at [facility], [address], [place], [place] [address]    Report Status PENDING  Incomplete  Blood Culture (routine x 2)     Status: None (Preliminary result)   Collection Time: 07/05/22 11:41 PM   Specimen: BLOOD  Result Value Ref Range Status   Specimen Description   Final    BLOOD BLOOD RIGHT HAND Performed at [facility], [address] [place], [place] [address]    Special Requests   Final    BOTTLES DRAWN AEROBIC AND ANAEROBIC Blood Culture results may not be optimal due to an inadequate volume of blood received in culture bottles Performed at [facility], [address], [place], [place] [address]    Culture   Final    NO GROWTH 4 DAYS Performed at [facility], [address],  [place], [place] [address]    Report Status PENDING  Incomplete  Resp panel by RT-PCR (RSV, Flu A&B, Covid) Anterior Nasal Swab     Status: None   Collection Time: 07/06/22 12:20 AM   Specimen: Anterior Nasal Swab  Result Value Ref Range Status   SARS Coronavirus 2 by RT PCR NEGATIVE NEGATIVE Final    Comment: (NOTE) SARS-CoV-2 target nucleic acids are NOT DETECTED.  The SARS-CoV-2 RNA is generally detectable in upper respiratory specimens during the acute phase of infection. The lowest concentration of SARS-CoV-2 viral copies this assay can detect is 138 copies/mL. A negative result does not preclude SARS-Cov-2 infection and should not be used as the sole basis for treatment or other  patient management decisions. A negative result may occur with  improper specimen collection/handling, submission of specimen other than nasopharyngeal swab, presence of viral mutation(s) within the areas targeted by this assay, and inadequate number of viral copies(<138 copies/mL). A negative result must be combined with clinical observations, patient history, and epidemiological information. The expected result is Negative.  Fact Sheet for Patients:  BloggerCourse.com  Fact Sheet for Healthcare Providers:  SeriousBroker.it  This test is no t yet approved or cleared by the [place] FDA and  has been authorized for detection and/or diagnosis of SARS-CoV-2 by FDA under an Emergency Use Authorization (EUA). This EUA will remain  in effect (meaning this test can be used) for the duration of the COVID-19 declaration under Section 564(b)(1) of the Act, 21 U.S.C.section 360bbb-3(b)(1), unless the authorization is terminated  or revoked sooner.       Influenza A by PCR NEGATIVE NEGATIVE Final   Influenza B by PCR NEGATIVE NEGATIVE Final    Comment: (NOTE) The Xpert Xpress SARS-CoV-2/FLU/RSV plus assay is intended as an aid in the diagnosis of influenza  from Nasopharyngeal swab specimens and should not be used as a sole basis for treatment. Nasal washings and aspirates are unacceptable for Xpert Xpress SARS-CoV-2/FLU/RSV testing.  Fact Sheet for Patients: BloggerCourse.com  Fact Sheet for Healthcare Providers: SeriousBroker.it  This test is not yet approved or cleared by the [place] FDA and has been authorized for detection and/or diagnosis of SARS-CoV-2 by FDA under an Emergency Use Authorization (EUA). This EUA will remain in effect (meaning this test can be used) for the duration of the COVID-19 declaration under Section 564(b)(1) of the Act, 21 U.S.C. section 360bbb-3(b)(1), unless the authorization is terminated or revoked.     Resp Syncytial Virus by PCR NEGATIVE NEGATIVE Final    Comment: (NOTE) Fact Sheet for Patients: BloggerCourse.com  Fact Sheet for Healthcare Providers: SeriousBroker.it  This test is not yet approved or cleared by the [place] FDA and has been authorized for detection and/or diagnosis of SARS-CoV-2 by FDA under an Emergency Use Authorization (EUA). This EUA will remain in effect (meaning this test can be used) for the duration of the COVID-19 declaration under Section 564(b)(1) of the Act, 21 U.S.C. section 360bbb-3(b)(1), unless the authorization is terminated or revoked.  Performed at [facility], [address], [place], [place] [address]   MRSA Next Gen by PCR, Nasal     Status: None   Collection Time: 07/06/22  7:43 AM   Specimen: Nasal Mucosa; Nasal Swab  Result Value Ref Range Status   MRSA by PCR Next Gen NOT DETECTED NOT DETECTED Final    Comment: (NOTE) The GeneXpert MRSA Assay (FDA approved for NASAL specimens only), is one component of a comprehensive MRSA colonization surveillance program. It is not intended to diagnose MRSA infection nor to guide or  monitor treatment for MRSA infections. Test performance is not FDA approved in patients less than 81 years old. Performed at [facility], [address] [place], [place] [address]          Radiology Studies: No results found.      Scheduled Meds:  atorvastatin  40 mg Oral QHS   azithromycin  500 mg Oral Daily   buPROPion  150 mg Oral Daily   divalproex  500 mg Oral TID   fludrocortisone  0.05 mg Oral Daily   furosemide  20 mg Oral Daily   insulin aspart  0-5 Units Subcutaneous QHS  insulin aspart  0-9 Units Subcutaneous TID WC   insulin glargine-yfgn  5 Units Subcutaneous Daily   QUEtiapine  25 mg Oral QHS   Continuous Infusions:  ceFEPime (MAXIPIME) IV 2 g (07/09/22 2233)   lactated ringers 75 mL/hr at 07/10/22 0750   magnesium sulfate bolus IVPB       LOS: 4 days    Time spent: 51 minutes spent on chart review, discussion with nursing staff, consultants, updating family and interview/physical exam; more than 50% of that time was spent in counseling and/or coordination of care.    Alvira Philips [place], DO [facility] Available via Epic secure chat 7am-7pm After these hours, please refer to coverage provider listed on [email or web address] 07/10/2022, 11:21 AM

## 2022-07-10 NOTE — Plan of Care (Signed)

## 2022-07-10 NOTE — Progress Notes (Addendum)
Physical Therapy Treatment Patient Details Name: Brittany [name] MRN: [id number] DOB: 23-Jun-1960 Today's Date: 07/10/2022   History of Present Illness Patient is a 62 year old female who presented from [facility] SNF with AMS. Patient was admitted with severe sepsis, acute metabolic encephalopathy, CAP, lactic acidosis. PMH: seizure disorder, colectomy with colostomy, depression, dementia, anxiety, othostatic    PT Comments    General Comments: Pt in bed with head slumped over her tray table. VERY difficult to arouse.  VERY difficult to achieve eye contact at first.  Lethargic.  Groogy.  Heavy eyes.  Was able to state her name after several prompts.  For the most part, groggy, slow, non verbal.  Assisted OOB was difficult.  General bed mobility comments: required increased asisst this session.  Pt found in bed in a large puddle of urine with soaked towel between her legs.  PeriWick partially placed.  Colostomy found full blown with air and stool. Therapist emptied colostomy.   Alerted RN.  Required Total Assist + 2 to transfer to seated EOB.  Poor collapsed posture.  Mod Assist to prevent forward LOB.  Alertness remains groggy.  Poor eye contact. General transfer comment: Pt remained partially lethargic so used "Bear Hug" SPS 1/4 turn from elevated bed to [facility].  Pt did void.  Pt remained non verbal.  RN called to room.  Brief eye contact to verbal stim.  Pt did repeat her name.  "Brittany [name]".  Required + 2 Max side by side assist to rise from [facility] and pt was able to stand with walker at Mod Assist.  Poor forward flexed posture.  Peri care given. General Gait Details: Pt required + 3 asisst to amb such that recliner was following.  Pt amb a greater distance of 18 feet with Therapist navigating walker forward and supporting trunk to increase upright posture.  Gait was "sloppy" shuffled with walker too far to front.  Pt unable to correct posture or walker distance.  HIGH FALL RISK.  Alertness did improve some.  Increased  eye contact and a few smiles.  Otherwise, non verbal. Positioned in recliner upright with multiple pillows.  Returned to room in recliner.  RN and NT in room. Pt plans to return to SNF.   Recommendations for follow up therapy are one component of a multi-disciplinary discharge planning process, led by the attending physician.  Recommendations may be updated based on patient status, additional functional criteria and insurance authorization.  Follow Up Recommendations  Can patient physically be transported by private vehicle: No    Assistance Recommended at Discharge Intermittent Supervision/Assistance  Patient can return home with the following Help with stairs or ramp for entrance;Assistance with cooking/housework;Assist for transportation;A lot of help with walking and/or transfers   Equipment Recommendations  None recommended by PT    Recommendations for Other Services       Precautions / Restrictions Precautions Precautions: Fall Precaution Comments: Hx CVA Restrictions Weight Bearing Restrictions: No     Mobility  Bed Mobility Overal bed mobility: Needs Assistance Bed Mobility: Supine to Sit     Supine to sit: Total assist, +2 for physical assistance, +2 for safety/equipment     General bed mobility comments: required increased asisst this session.  Pt found in bed in a large puddle of urine with soaked towel between her legs.  PeriWick partially placed.  Colostomy found full blown with air and stool. Therapist emptied colostomy.   Alerted RN. Required Total Assist + 2 to transfer to seated EOB.  Poor collapsed posture.  Mod Assist to prevent forward LOB.  Alertness remains groggy.  Poor eye contact.    Transfers Overall transfer level: Needs assistance Equipment used: None, Rolling walker (2 wheels) Transfers: Sit to/from Stand, Bed to chair/wheelchair/BSC Sit to Stand: Max assist, Total assist, +2 physical assistance, +2 safety/equipment Stand pivot transfers: Total  assist, +2 physical assistance, +2 safety/equipment         General transfer comment: Pt remained partially lethargic so used "Bear Hug" SPS 1/4 turn from elevated bed to [facility].  Pt did void.  Pt remained non verbal.  RN called to room.  Brief eye contact to verbal stim.  Pt did repeat her name.  "Brittany [name]".  Required + 2 Max side by side assist to rise from [facility] and pt was able to stand with walker at Mod Assist.  Poor forward flexed posture.  Peri care given.    Ambulation/Gait Ambulation/Gait assistance: Max assist, +2 physical assistance, +2 safety/equipment Gait Distance (Feet): 18 Feet Assistive device: Rolling walker (2 wheels) Gait Pattern/deviations: Shuffle, Step-to pattern, Staggering left, Staggering right, Trunk flexed Gait velocity: decreased     General Gait Details: Pt required + 3 asisst to amb such that recliner was following.  Pt amb a greater distance of 18 feet with Therapist navigating walker forward and supporting trunk to increase upright posture.  Gait was "sloppy" shuffled with walker too far to front.  Pt unable to correct posture or walker distance.  HIGH FALL RISK.  Alertness did improve some.  Increased eye contact and a few smiles.  Otherwise, non verbal.   Stairs             Wheelchair Mobility    Modified Rankin (Stroke Patients Only)       Balance                                            Cognition Arousal/Alertness: Lethargic Behavior During Therapy: Flat affect Overall Cognitive Status: No family/caregiver present to determine baseline cognitive functioning                                 General Comments: VERY difficult to arrouse.  VERY difficult to achieve eye contact at first.  Lethargic.  Groogy.  Heavy eyes.  Was able to state her name after several prompts.  For the most part, groggy, slow, non verbal.        Exercises      General Comments        Pertinent Vitals/Pain Pain Assessment Pain  Assessment: No/denies pain    Home Living                          Prior Function            PT Goals (current goals can now be found in the care plan section) Progress towards PT goals: Progressing toward goals    Frequency    Min 1X/week      PT Plan Current plan remains appropriate    Co-evaluation              AM-PAC PT "6 Clicks" Mobility   Outcome Measure  Help needed turning from your back to your side while in a flat bed without using bedrails?: A Lot Help  needed moving from lying on your back to sitting on the side of a flat bed without using bedrails?: A Lot Help needed moving to and from a bed to a chair (including a wheelchair)?: A Lot Help needed standing up from a chair using your arms (e.g., wheelchair or bedside chair)?: Total Help needed to walk in hospital room?: Total Help needed climbing 3-5 steps with a railing? : Total 6 Click Score: 9    End of Session Equipment Utilized During Treatment: Gait belt Activity Tolerance: Patient limited by fatigue Patient left: with call bell/phone within reach;with chair alarm set;in chair Nurse Communication: Mobility status PT Visit Diagnosis: Other abnormalities of gait and mobility (R26.89);Difficulty in walking, not elsewhere classified (R26.2)     Time: 1420-1500 PT Time Calculation (min) (ACUTE ONLY): 40 min  Charges:  $Gait Training: 8-22 mins $Therapeutic Activity: 23-37 mins                     Felecia Shelling  PTA Acute  Rehabilitation Services Office M-F          [phone]

## 2022-07-11 DIAGNOSIS — A419 Sepsis, unspecified organism: Secondary | ICD-10-CM | POA: Diagnosis not present

## 2022-07-11 DIAGNOSIS — J189 Pneumonia, unspecified organism: Secondary | ICD-10-CM | POA: Diagnosis not present

## 2022-07-11 DIAGNOSIS — R6520 Severe sepsis without septic shock: Secondary | ICD-10-CM | POA: Diagnosis not present

## 2022-07-11 LAB — CULTURE, BLOOD (ROUTINE X 2): Culture: NO GROWTH

## 2022-07-11 LAB — BASIC METABOLIC PANEL
Anion gap: 6 (ref 5–15)
BUN: 18 mg/dL (ref 8–23)
CO2: 31 mmol/L (ref 22–32)
Calcium: 8.3 mg/dL — ABNORMAL LOW (ref 8.9–10.3)
Chloride: 98 mmol/L (ref 98–111)
Creatinine, Ser: 0.81 mg/dL (ref 0.44–1.00)
GFR, Estimated: >60 mL/min (ref >60)
Glucose, Bld: 50 mg/dL — ABNORMAL LOW (ref 70–99)
Potassium: 3.4 mmol/L — ABNORMAL LOW (ref 3.5–5.1)
Sodium: 135 mmol/L (ref 135–145)

## 2022-07-11 LAB — CBC
HCT: 29.1 % — ABNORMAL LOW (ref 36.0–46.0)
Hemoglobin: 9.8 g/dL — ABNORMAL LOW (ref 12.0–15.0)
MCH: 30.8 pg (ref 26.0–34.0)
MCHC: 33.7 g/dL (ref 30.0–36.0)
MCV: 91.5 fL (ref 80.0–100.0)
Platelets: 314 10*3/uL (ref 150–400)
RBC: 3.18 MIL/uL — ABNORMAL LOW (ref 3.87–5.11)
RDW: 19.9 % — ABNORMAL HIGH (ref 11.5–15.5)
WBC: 12.1 10*3/uL — ABNORMAL HIGH (ref 4.0–10.5)
nRBC: 0.4 % — ABNORMAL HIGH (ref 0.0–0.2)

## 2022-07-11 LAB — MAGNESIUM: Magnesium: 2.4 mg/dL (ref 1.7–2.4)

## 2022-07-11 LAB — GLUCOSE, CAPILLARY
Glucose-Capillary: 27 mg/dL — CL (ref 70–99)
Glucose-Capillary: 166 mg/dL — ABNORMAL HIGH (ref 70–99)
Glucose-Capillary: 140 mg/dL — ABNORMAL HIGH (ref 70–99)
Glucose-Capillary: 311 mg/dL — ABNORMAL HIGH (ref 70–99)
Glucose-Capillary: 335 mg/dL — ABNORMAL HIGH (ref 70–99)

## 2022-07-11 MED ORDER — DEXTROSE 50 % IV SOLN
INTRAVENOUS | Status: AC
  Filled 2022-07-11: qty 50

## 2022-07-11 MED ORDER — SODIUM CHLORIDE 0.9 % IV BOLUS
500.0000 mL | Freq: Once | INTRAVENOUS | Status: AC
  Administered 2022-07-11: 500 mL via INTRAVENOUS

## 2022-07-11 MED ORDER — POTASSIUM CHLORIDE CRYS ER 20 MEQ PO TBCR
40.0000 meq | EXTENDED_RELEASE_TABLET | Freq: Once | ORAL | Status: AC
  Administered 2022-07-11: 40 meq via ORAL
  Filled 2022-07-11: qty 2

## 2022-07-11 MED ORDER — INSULIN ASPART 100 UNIT/ML IJ SOLN
0.0000 [IU] | Freq: Every day | INTRAMUSCULAR | Status: DC
  Administered 2022-07-11: 4 [IU] via SUBCUTANEOUS

## 2022-07-11 MED ORDER — INSULIN ASPART 100 UNIT/ML IJ SOLN
0.0000 [IU] | Freq: Three times a day (TID) | INTRAMUSCULAR | Status: DC
  Administered 2022-07-11: 4 [IU] via SUBCUTANEOUS

## 2022-07-11 NOTE — Progress Notes (Signed)
PROGRESS NOTE    Brittany [name]  ZOX:[id number] DOB: 01-31-1961 DOA: 07/05/2022 PCP: Jarrett Soho, NP    Brief Narrative:   Brittany [name] is a 62 y.o. female with past medical history significant for dementia, type 2 diabetes mellitus, HLD, HTN, lupus, history of pancreatic mass, seizure disorder, CVA, RLS, history of colectomy with colostomy, history of recurrent C. difficile colitis, orthostatic hypotension, chronic diastolic congestive heart failure, depression/anxiety, dementia who presented to [facility] ED via EMS from [facility] for evaluation of altered mental status.  Nursing staff reported that patient was baseline at 6 AM and then her nurse returned in the evening and was found confused.  They reportedly had to feed the patient today in which she was not talking, unable to ambulate in which at baseline she is able to feed herself and normally walk without much issue.  On EMS arrival, patient was noted to be febrile with rectal temperature 103.9 F with, and tachycardic into the 110s, hypotensive with systolic blood pressures in the 80s with O2 saturation 80% on room air.  Patient was placed on 3 L nasal cannula and transported to the ED for further evaluation.  In the ED, temperature 103.9 F, HR 111, RR 16, BP 86/71, SpO2 85% on room air.  WBC 12.7, hemoglobin 11.0, platelets 271.  Sodium 136, potassium 4.7, chloride 109, CO2 20, glucose 109, BUN 20, creatinine 1.20.  AST 29, ALT 21, total bilirubin 0.4.  Ammonia level 53.  Lactic acid 2.0.  INR 1.2.  Urinalysis unrevealing.  COVID/influenza/RSV PCR negative.  CT head without contrast with no acute intracranial abnormality, stable appearance of multifocal chronic encephalomalacia, advanced white matter disease.  Chest x-ray with new hazy opacity right lung suspicious for pneumonia.  EDP administered Tylenol, vancomycin, cefepime, metronidazole and greater than 1.75 L IV fluid bolus.  Patient remained hypotensive  with SBP in the 80s and ED physician discussed with on-call critical care who felt patient has low blood pressure at baseline and ICU admission/pressures not indicated at this time.  TRH consulted for admission.  Assessment & Plan:   Severe sepsis, POA Community acquired pneumonia Acute metabolic encephalopathy Lactic acidosis Patient presenting from SNF after being found altered.  On EMS arrival, patient was hypotensive, tachycardic, hypoxic.  WBC count elevated 12.7, lactic acid 2.0, chest x-ray with right lung consolidation/opacity.  CT head unrevealing.  COVID/RSV/influenza PCR negative.  Blood pressure responded to IV fluid resuscitation.  MRSA PCR negative. -- WBC 12.7>19.3>19.8>14.8>13.4>10.7 -- Lactic acid 2.0>2.7>2.5>1.4 -- Blood cultures x 2: No growth x 4 days -- abx- treated -- DuoNeb q6h PRN Wheezing/shortness of breath -- oxygen now weaned off.  Hypomagnesemia -repleted  Hypokalemia -replete  Type 2 diabetes mellitus with hypoglycemia Hemoglobin A1c 10.9, poorly controlled.  Home regimen includes Levemir 8 units [place] BID --SSI for now only-- will need close adjustment at SNF  Essential hypertension Chronic diastolic congestive heart failure Home regimen includes Lasix 20 mg p.o. daily -- holding lasix-- suspect will need PRN at SNF -- Strict I's and O's and daily weights -- Monitor BP closely  Hyperlipidemia -- Atorvastatin 40 mg p.o. daily  Seizure disorder -- Depakote 500 mg p.o. 3 times daily  History of colectomy with colostomy -- Wound care RN consulted/following for continued management of ostomy  Depression/anxiety Dementia -- Wellbutrin 150 mg p.o. daily -- Seroquel 25 mg p.o. nightly  Orthostatic hypotension --Continue fludrocortisone 0.05 mg p.o. daily  Weakness/debility/deconditioning/gait disturbance: Currently resides at [facility], SNF --  PT/OT evaluation following, plan to return to SNF in AM?   DVT prophylaxis: SCDs Start:  07/06/22 0500    Code Status: Full Code Family Communication: No family present at bedside this morning  Disposition Plan:  Level of care: Med-Surg Status is: Inpatient Remains inpatient appropriate because: IV antibiotics    Consultants:  None  Procedures:  None   Subjective: Had episode of low blood sugar this AM Actually hungry this AM as well    Objective: Vitals:   07/11/22 0538 07/11/22 0632 07/11/22 0733 07/11/22 0813  BP: (!) 79/57 (!) 84/53 (!) 83/59 109/68  Pulse: 77 70 70 68  Resp: 16   17  Temp: 98.1 F (36.7 C)   (!) 97.5 F (36.4 C)  TempSrc: Oral   Oral  SpO2: 99%   94%  Weight:      Height:        Intake/Output Summary (Last 24 hours) at 07/11/2022 1046 Last data filed at 07/11/2022 1610 Gross per 24 hour  Intake 1360 ml  Output 2425 ml  Net -1065 ml   Filed Weights   07/05/22 2320  Weight: 54.4 kg    Examination:  Physical Exam:  General: Appearance:    Older than stated age  female in no acute distress     Lungs:     respirations unlabored  Heart:    Normal heart rate. Normal rhythm. No murmurs, rubs, or gallops.   MS:   All extremities are intact.   Neurologic:   Awake, alert       Data Reviewed: I have personally reviewed following labs and imaging studies  CBC: Recent Labs  Lab 07/05/22 2334 07/06/22 0538 07/07/22 0401 07/08/22 0405 07/09/22 0402 07/10/22 0437 07/11/22 0439  WBC 12.7*   < > 19.8* 14.8* 13.4* 10.7* 12.1*  NEUTROABS 8.9*  --   --   --   --   --   --   HGB 11.0*   < > 10.1* 10.5* 11.0* 10.5* 9.8*  HCT 33.6*   < > 30.7* 31.1* 32.6* 30.9* 29.1*  MCV 94.1   < > 92.7 89.6 90.8 91.2 91.5  PLT 271   < > 271 323 353 321 314   < > = values in this interval not displayed.   Basic Metabolic Panel: Recent Labs  Lab 07/07/22 0401 07/08/22 0405 07/09/22 0402 07/10/22 0437 07/11/22 0439  NA 134* 134* 138 134* 135  K 4.0 4.3 3.2* 4.5 3.4*  CL 103 96* 99 97* 98  CO2 21* 27 30 29 31   GLUCOSE 199* 133* 100*  112* 50*  BUN 12 9 10 12 18   CREATININE 0.82 0.66 0.75 0.81 0.81  CALCIUM 8.2* 8.2* 8.3* 8.1* 8.3*  MG 1.4* 1.8 1.8 1.6* 2.4  PHOS 3.2  --   --   --   --    GFR: Estimated Creatinine Clearance: 51.7 mL/min (by C-G formula based on SCr of 0.81 mg/dL). Liver Function Tests: Recent Labs  Lab 07/05/22 2334  AST 29  ALT 21  ALKPHOS 100  BILITOT 0.4  PROT 7.7  ALBUMIN 2.5*   No results for input(s): "LIPASE", "AMYLASE" in the last 168 hours. Recent Labs  Lab 07/06/22 0634  AMMONIA 53*   Coagulation Profile: Recent Labs  Lab 07/05/22 2334  INR 1.2   Cardiac Enzymes: No results for input(s): "CKTOTAL", "CKMB", "CKMBINDEX", "TROPONINI" in the last 168 hours. BNP (last 3 results) No results for input(s): "PROBNP" in the last 8760 hours. HbA1C:  No results for input(s): "HGBA1C" in the last 72 hours.  CBG: Recent Labs  Lab 07/10/22 1224 07/10/22 1711 07/10/22 2135 07/11/22 0729 07/11/22 0810  GLUCAP 126* 310* 296* 27* 166*   Lipid Profile: No results for input(s): "CHOL", "HDL", "LDLCALC", "TRIG", "CHOLHDL", "LDLDIRECT" in the last 72 hours. Thyroid Function Tests: No results for input(s): "TSH", "T4TOTAL", "FREET4", "T3FREE", "THYROIDAB" in the last 72 hours. Anemia Panel: No results for input(s): "VITAMINB12", "FOLATE", "FERRITIN", "TIBC", "IRON", "RETICCTPCT" in the last 72 hours. Sepsis Labs: Recent Labs  Lab 07/05/22 2334 07/06/22 0229 07/06/22 0538 07/07/22 0401  LATICACIDVEN 2.0* 2.7* 2.5* 1.4    Recent Results (from the past 240 hour(s))  Blood Culture (routine x 2)     Status: None   Collection Time: 07/05/22 11:34 PM   Specimen: BLOOD  Result Value Ref Range Status   Specimen Description   Final    BLOOD BLOOD LEFT FOREARM Performed at [facility] [address] [place], [place] [address]    Special Requests   Final    BOTTLES DRAWN AEROBIC AND ANAEROBIC Blood Culture adequate volume Performed at [facility], [address], [place], [place] [address]    Culture   Final    NO GROWTH 5 DAYS Performed at [facility], [address] [place], [place] [address]    Report Status 07/11/2022 FINAL  Final  Blood Culture (routine x 2)     Status: None   Collection Time: 07/05/22 11:41 PM   Specimen: BLOOD  Result Value Ref Range Status   Specimen Description   Final    BLOOD BLOOD RIGHT HAND Performed at [facility], [address], [place], [place] [address]    Special Requests   Final    BOTTLES DRAWN AEROBIC AND ANAEROBIC Blood Culture results may not be optimal due to an inadequate volume of blood received in culture bottles Performed at [facility], [address] [place], [place] [address]    Culture   Final    NO GROWTH 5 DAYS Performed at [facility], [address], [place], [place] [address]    Report Status 07/11/2022 FINAL  Final  Resp panel by RT-PCR (RSV, Flu A&B, Covid) Anterior Nasal Swab     Status: None   Collection Time: 07/06/22 12:20 AM   Specimen: Anterior Nasal Swab  Result Value Ref Range Status   SARS Coronavirus 2 by RT PCR NEGATIVE NEGATIVE Final    Comment: (NOTE) SARS-CoV-2 target nucleic acids are NOT DETECTED.  The SARS-CoV-2 RNA is generally detectable in upper respiratory specimens during the acute phase of infection. The lowest concentration of SARS-CoV-2 viral copies this assay can detect is 138 copies/mL. A negative result does not preclude SARS-Cov-2 infection and should not be used as the sole basis for treatment or other patient management decisions. A negative result may occur with  improper specimen collection/handling, submission of specimen other than nasopharyngeal swab, presence of viral mutation(s) within the areas targeted by this assay, and inadequate number of viral copies(<138 copies/mL). A negative result must be combined with clinical observations, patient history, and  epidemiological information. The expected result is Negative.  Fact Sheet for Patients:  BloggerCourse.com  Fact Sheet for Healthcare Providers:  SeriousBroker.it  This test is no t yet approved or cleared by the [place] FDA and  has been authorized for detection and/or diagnosis of SARS-CoV-2 by FDA under an Emergency Use Authorization (EUA).  This EUA will remain  in effect (meaning this test can be used) for the duration of the COVID-19 declaration under Section 564(b)(1) of the Act, 21 U.S.C.section 360bbb-3(b)(1), unless the authorization is terminated  or revoked sooner.       Influenza A by PCR NEGATIVE NEGATIVE Final   Influenza B by PCR NEGATIVE NEGATIVE Final    Comment: (NOTE) The Xpert Xpress SARS-CoV-2/FLU/RSV plus assay is intended as an aid in the diagnosis of influenza from Nasopharyngeal swab specimens and should not be used as a sole basis for treatment. Nasal washings and aspirates are unacceptable for Xpert Xpress SARS-CoV-2/FLU/RSV testing.  Fact Sheet for Patients: BloggerCourse.com  Fact Sheet for Healthcare Providers: SeriousBroker.it  This test is not yet approved or cleared by the [place] FDA and has been authorized for detection and/or diagnosis of SARS-CoV-2 by FDA under an Emergency Use Authorization (EUA). This EUA will remain in effect (meaning this test can be used) for the duration of the COVID-19 declaration under Section 564(b)(1) of the Act, 21 U.S.C. section 360bbb-3(b)(1), unless the authorization is terminated or revoked.     Resp Syncytial Virus by PCR NEGATIVE NEGATIVE Final    Comment: (NOTE) Fact Sheet for Patients: BloggerCourse.com  Fact Sheet for Healthcare Providers: SeriousBroker.it  This test is not yet approved or cleared by the [place] FDA and has been  authorized for detection and/or diagnosis of SARS-CoV-2 by FDA under an Emergency Use Authorization (EUA). This EUA will remain in effect (meaning this test can be used) for the duration of the COVID-19 declaration under Section 564(b)(1) of the Act, 21 U.S.C. section 360bbb-3(b)(1), unless the authorization is terminated or revoked.  Performed at [facility], [address], [place], [place] [address]   MRSA Next Gen by PCR, Nasal     Status: None   Collection Time: 07/06/22  7:43 AM   Specimen: Nasal Mucosa; Nasal Swab  Result Value Ref Range Status   MRSA by PCR Next Gen NOT DETECTED NOT DETECTED Final    Comment: (NOTE) The GeneXpert MRSA Assay (FDA approved for NASAL specimens only), is one component of a comprehensive MRSA colonization surveillance program. It is not intended to diagnose MRSA infection nor to guide or monitor treatment for MRSA infections. Test performance is not FDA approved in patients less than 13 years old. Performed at [facility], [address] [place], [place] [address]          Radiology Studies: No results found.      Scheduled Meds:  atorvastatin  40 mg Oral QHS   buPROPion  150 mg Oral Daily   divalproex  500 mg Oral TID   fludrocortisone  0.05 mg Oral Daily   insulin aspart  0-5 Units Subcutaneous QHS   insulin aspart  0-6 Units Subcutaneous TID WC   potassium chloride  40 mEq Oral Once   QUEtiapine  25 mg Oral QHS   Continuous Infusions:  ceFEPime (MAXIPIME) IV 2 g (07/10/22 2223)     LOS: 5 days    Time spent: 40 minutes spent on chart review, discussion with nursing staff, consultants, updating family and interview/physical exam; more than 50% of that time was spent in counseling and/or coordination of care.    Joseph Art, DO [facility] Available via Epic secure chat 7am-7pm After these hours, please refer to coverage provider listed on [email or web address] 07/11/2022, 10:46  AM

## 2022-07-11 NOTE — Progress Notes (Signed)
OT Cancellation Note  Patient Details Name: Brittany [name] MRN: [id number] DOB: 07-16-1960   Cancelled Treatment:    Reason Eval/Treat Not Completed: Other (comment) Patient is eating lunch at this time. OT to continue to follow and check back as schedule will allow.  Rosalio Loud, MS Acute Rehabilitation Department Office# [phone]  07/11/2022, 3:13 PM

## 2022-07-11 NOTE — Progress Notes (Signed)
Patient triggered a yellow mews for a bp of 79/57 this morning, provider Johann Capers, NP) notified and a 500 cc nss bolus was ordered and infused, recheck after bolus was 84/53, provider Johann Capers, NP) notified of new reading and informed that next set of vitals were due to be done at 0800, no new orders at this time as provider said 84 was closer to her baseline per previous notes. Will continue to monitor. Patient is currently sleeping.

## 2022-07-11 NOTE — Progress Notes (Signed)
MEWS Progress Note  Patient Details Name: Brittany [name] MRN: [id number] DOB: 1961/01/16 Today's Date: 07/11/2022   MEWS Flowsheet Documentation:  Assess: MEWS Score Temp: 98.1 F (36.7 C) BP: (!) 79/57 MAP (mmHg): 65 Pulse Rate: 77 ECG Heart Rate: 90 Resp: 20 Level of Consciousness: Alert SpO2: 99 % O2 Device: Room Air O2 Flow Rate (L/min): 1 L/min Assess: MEWS Score MEWS Temp: 0 MEWS Systolic: 2 MEWS Pulse: 0 MEWS RR: 0 MEWS LOC: 0 MEWS Score: 2 MEWS Score Color: Yellow Assess: SIRS CRITERIA SIRS Temperature : 0 SIRS Respirations : 0 SIRS Pulse: 0 SIRS WBC: 0 SIRS Score Sum : 0 Assess: if the MEWS score is Yellow or Red Were vital signs taken at a resting state?: Yes Focused Assessment: Change from prior assessment (see assessment flowsheet) Does the patient meet 2 or more of the SIRS criteria?: No MEWS guidelines implemented : Yes, yellow Treat MEWS Interventions: Considered administering scheduled or prn medications/treatments as ordered Take Vital Signs Increase Vital Sign Frequency : Yellow: Q2hr x1, continue Q4hrs until patient remains green for 12hrs Escalate MEWS: Escalate: Yellow: Discuss with charge nurse and consider notifying provider and/or RRT Provider Notification Provider Name/Title: Johann Capers, NP Date Provider Notified: 07/10/22 Time Provider Notified: [phone] Method of Notification: Page (message sent via secure chat to notify) Notification Reason: Other (Comment) (per mews protocol, low bp) Provider response: See new orders (ordering a bolus) Date of Provider Response: 07/10/22 Time of Provider Response: [phone] Notify: Rapid Response Name of Rapid Response RN Notified: not necessary at this time      Kizzie Bane 07/11/2022, 5:55 AM

## 2022-07-11 NOTE — TOC Progression Note (Signed)
Transition of Care [facility]) - Progression Note    Patient Details  Name: LEXISS LAMKIN MRN: [id number] Date of Birth: 1960-04-09  [facility] CM/SW Contact  [name], LCSW Phone Number: 07/11/2022, 11:41 AM  Clinical Narrative:    CSW attempted to call pt's [employer] to discuss d/c plan, no answer left VM requesting return call.   Pt's insurance auth for [facility] is from 5/28-5/30, pt has a 24 hr window to admit. TOC to follow.   Expected Discharge Plan: Skilled Nursing Facility Barriers to Discharge: Continued Medical Work up  Expected Discharge Plan and Services       Living arrangements for the past 2 months: Skilled Nursing Facility                                       Social Determinants of Health (SDOH) Interventions SDOH Screenings   Food Insecurity: No Food Insecurity (05/14/2022)  Housing: Patient Unable To Answer (05/14/2022)  Transportation Needs: No Transportation Needs (05/14/2022)  Utilities: Not At Risk (05/14/2022)  Tobacco Use: High Risk (07/05/2022)    Readmission Risk Interventions    12/29/2021    1:48 PM 09/22/2021    1:24 PM 03/06/2021    9:13 AM  Readmission Risk Prevention Plan  Transportation Screening Complete Complete Complete  Medication Review Oceanographer) Complete Complete Complete  PCP or Specialist appointment within 3-5 days of discharge  Complete Complete  HRI or Home Care Consult Complete Complete   SW Recovery Care/Counseling Consult Complete Complete   Palliative Care Screening Not Applicable Not Applicable Not Applicable  Skilled Nursing Facility Not Applicable Complete Not Applicable

## 2022-07-11 NOTE — Plan of Care (Signed)
  Problem: Coping: Goal: Ability to adjust to condition or change in health will improve Outcome: Progressing   Problem: Safety: Goal: Ability to remain free from injury will improve Outcome: Progressing   

## 2022-07-11 NOTE — Progress Notes (Signed)
CBG 27 this am. Pt is alert, oriented to self only. Dextrose 50 % administered. Provider updated.

## 2022-07-11 NOTE — Inpatient Diabetes Management (Signed)
Inpatient Diabetes Program Recommendations  AACE/ADA: New Consensus Statement on Inpatient Glycemic Control (2015)  Target Ranges:  Prepandial:   less than 140 mg/dL      Peak postprandial:   less than 180 mg/dL (1-2 hours)      Critically ill patients:  140 - 180 mg/dL   Lab Results  Component Value Date   GLUCAP 166 (H) 07/11/2022   HGBA1C 10.9 (H) 07/05/2022    Review of Glycemic Control  Latest Reference Range & Units 07/10/22 07:34 07/10/22 12:24 07/10/22 17:11 07/10/22 21:35 07/11/22 07:29 07/11/22 08:10  Glucose-Capillary 70 - 99 mg/dL 657 (H) 846 (H) 962 (H) 296 (H) 27 (LL) 166 (H)   Diabetes history: DM  Outpatient Diabetes medications:  Humalog 1-7 units tid with meals Semglee 10 units bid Metformin 500 mg bid Current orders for Inpatient glycemic control:  Novolog 0-9 units tid with meals and HS Inpatient Diabetes Program Recommendations:   Consider reducing Novolog to 0-6 units tid with meals.   Thanks,  Beryl Meager, RN, BC-ADM Inpatient Diabetes Coordinator Pager [phone]  (8a-5p)

## 2022-07-12 DIAGNOSIS — G9340 Encephalopathy, unspecified: Secondary | ICD-10-CM

## 2022-07-12 DIAGNOSIS — J189 Pneumonia, unspecified organism: Secondary | ICD-10-CM | POA: Diagnosis not present

## 2022-07-12 LAB — GLUCOSE, CAPILLARY: Glucose-Capillary: 79 mg/dL (ref 70–99)

## 2022-07-12 MED ORDER — IPRATROPIUM-ALBUTEROL 0.5-2.5 (3) MG/3ML IN SOLN: 3.0000 mL | Freq: Four times a day (QID) | RESPIRATORY_TRACT | Status: DC | PRN

## 2022-07-12 MED ORDER — ORAL CARE MOUTH RINSE
15.0000 mL | OROMUCOSAL | Status: DC
  Administered 2022-07-12: 15 mL via OROMUCOSAL

## 2022-07-12 MED ORDER — FUROSEMIDE 20 MG PO TABS: 20.0000 mg | ORAL_TABLET | Freq: Every day | ORAL | 0 refills | Status: AC | PRN

## 2022-07-12 MED ORDER — INSULIN ASPART 100 UNIT/ML IJ SOLN: 2.0000 [IU] | Freq: Three times a day (TID) | INTRAMUSCULAR | Status: DC

## 2022-07-12 MED ORDER — INSULIN ASPART 100 UNIT/ML IJ SOLN: 2.0000 [IU] | Freq: Three times a day (TID) | INTRAMUSCULAR | 11 refills | Status: DC

## 2022-07-12 MED ORDER — ORAL CARE MOUTH RINSE: 15.0000 mL | OROMUCOSAL | Status: DC | PRN

## 2022-07-12 MED ORDER — INSULIN GLARGINE-YFGN 100 UNIT/ML ~~LOC~~ SOLN
4.0000 [IU] | Freq: Every day | SUBCUTANEOUS | Status: DC
  Filled 2022-07-12: qty 0.04

## 2022-07-12 MED ORDER — INSULIN ASPART 100 UNIT/ML IJ SOLN: 0.0000 [IU] | Freq: Every day | INTRAMUSCULAR | 11 refills | Status: DC

## 2022-07-12 MED ORDER — INSULIN ASPART 100 UNIT/ML IJ SOLN: 0.0000 [IU] | Freq: Three times a day (TID) | INTRAMUSCULAR | 11 refills | Status: DC

## 2022-07-12 NOTE — TOC Transition Note (Signed)
Transition of Care [facility]) - CM/SW Discharge Note   Patient Details  Name: Brittany [name] MRN: [id number] Date of Birth: 1961-01-08  Transition [facility]) CM/SW Contact:  [name], LCSW Phone Number: 07/12/2022, 10:08 AM   Clinical Narrative:    CSW spoke with pt's LG ,DSS worker Sentrell to inform her of pt's d/c back to facility. Pt to d/c back to [facility]. Call report to [phone]. PTAR called. TOC sign off.    Final next level of care: Skilled Nursing Facility Barriers to Discharge: No Barriers Identified   Patient Goals and CMS Choice      Discharge Placement                  Patient to be transferred to facility by: EMS Name of family member notified: [place]Bird (DSS),Sentrell (Legal Guardian) [phone] (Mobile) Patient and family notified of of transfer: 07/12/22  Discharge Plan and Services Additional resources added to the After Visit Summary for                                       Social Determinants of Health (SDOH) Interventions SDOH Screenings   Food Insecurity: No Food Insecurity (05/14/2022)  Housing: Patient Unable To Answer (05/14/2022)  Transportation Needs: No Transportation Needs (05/14/2022)  Utilities: Not At Risk (05/14/2022)  Tobacco Use: High Risk (07/05/2022)     Readmission Risk Interventions    12/29/2021    1:48 PM 09/22/2021    1:24 PM 03/06/2021    9:13 AM  Readmission Risk Prevention Plan  Transportation Screening Complete Complete Complete  Medication Review Oceanographer) Complete Complete Complete  PCP or Specialist appointment within 3-5 days of discharge  Complete Complete  HRI or Home Care Consult Complete Complete   SW Recovery Care/Counseling Consult Complete Complete   Palliative Care Screening Not Applicable Not Applicable Not Applicable  Skilled Nursing Facility Not Applicable Complete Not Applicable

## 2022-07-12 NOTE — Progress Notes (Signed)
No change from am assessment. The patient is alert, oriented to self. RN attempted to call report to  [phone] ext [phone], Rahul. Voicemail message left. Additional number provided for admissions [phone] Rahul, voicemail message left for a return call for report.

## 2022-07-12 NOTE — Discharge Summary (Signed)
Physician Discharge Summary  Brittany [name] ZOX:[id number] DOB: 1960/06/08 DOA: 07/05/2022  PCP: Jarrett Soho, NP  Admit date: 07/05/2022 Discharge date: 07/12/2022  Admitted From: SNF Discharge disposition: SNF   Recommendations for Outpatient Follow-Up:   Cbc, bmp 1 week Close monitoring of blood sugars-- use the 2 units of meal coverage only if she eats >50% of meals PRN lasix Would consider palliative care referral   Discharge Diagnosis:   Principal Problem:   CAP (community acquired pneumonia) Active Problems:   Acute encephalopathy   Severe sepsis (HCC)   Type 2 diabetes mellitus (HCC)   Chronic heart failure with preserved ejection fraction (HFpEF) (HCC)   COPD (chronic obstructive pulmonary disease) (HCC)    Discharge Condition: Improved.  Diet recommendation: .carb mod  Wound care: None.  Code status: Full.   History of Present Illness:   Brittany [name] is a 62 y.o. female with medical history significant of dementia, COPD, insulin-dependent diabetes, hyperlipidemia, hypertension, lupus, pancreatic mass, seizure disorder, stroke, RLS, history of colectomy with colostomy, recurrent C. difficile colitis, orthostatic hypotension, chronic HFpEF, depression/anxiety presents to the ED via EMS from her nursing facility for evaluation of altered mental status.  Nursing staff reported that patient was at baseline at 6 AM in the morning and then her nurse returned tonight, patient was found altered.  They had to feed the patient today and she was not talking and could not walk.  Patient has dementia at baseline but is normally able to walk, talk, and feed herself.  When EMS arrived, patient was nonverbal.  Found to be febrile with rectal temperature 103.9 F, tachycardic to the 110s, hypotensive with systolic in the 80s, and oxygen saturation was 80% on room air.  She was placed on 3 L supplemental oxygen.  Labs showing WBC 12.7, hemoglobin 11.0 (stable),  platelet count 271k, sodium 136, potassium 4.7, chloride 109, bicarb 20, BUN 20, creatinine 1.2, glucose 109, UA not suggestive of infection, lactic acid 2.0> 2.7, INR 1.2, COVID/influenza/RSV PCR negative, blood cultures collected.  Chest x-ray showing new hazy opacity in the right lung medially suspicious for pneumonia.  Patient received Tylenol, vancomycin, cefepime, metronidazole, and >1.75 L IV fluids.  Patient remained hypotensive with systolic in the 80s.  ED physician discussed with on-call physician for critical care who felt that the patient has low blood pressure at baseline and that ICU admission/pressors not indicated at this time.   Patient somnolent but arousable.  Very confused and mumbling.  Not following commands.    Hospital Course by Problem:   Severe sepsis, POA Community acquired pneumonia Acute metabolic encephalopathy Lactic acidosis Patient presenting from SNF after being found altered.  On EMS arrival, patient was hypotensive, tachycardic, hypoxic.  WBC count elevated 12.7, lactic acid 2.0, chest x-ray with right lung consolidation/opacity.  CT head unrevealing.  COVID/RSV/influenza PCR negative.  Blood pressure responded to IV fluid resuscitation.  MRSA PCR negative. -- WBC 12.7>19.3>19.8>14.8>13.4>10.7 -- Lactic acid 2.0>2.7>2.5>1.4 -- Blood cultures x 2: No growth x 4 days -- abx- treated -- DuoNeb q6h PRN Wheezing/shortness of breath -- oxygen now weaned off.   Hypomagnesemia -repleted   Hypokalemia -repleted   Type 2 diabetes mellitus with hypoglycemia Hemoglobin A1c 10.9, poorly controlled.  Home regimen includes Levemir 8 units [place] BID --SSI plus meal coverage if eating 50% for now only-- will need close adjustment at SNF   Essential hypertension Chronic diastolic congestive heart failure Home regimen includes Lasix 20 mg p.o. daily --  changed to PRN swelling/fluid   Hyperlipidemia -- Atorvastatin 40 mg p.o. daily   Seizure disorder -- Depakote  500 mg p.o. 3 times daily   History of colectomy with colostomy -- Wound care RN consulted/following for continued management of ostomy   Depression/anxiety Dementia -- Wellbutrin 150 mg p.o. daily -- Seroquel 25 mg p.o. nightly   Orthostatic hypotension --Continue fludrocortisone 0.05 mg p.o. daily- adjust as needed   Weakness/debility/deconditioning/gait disturbance: Currently resides at [facility] [place], [place]       Medical Consultants:      Discharge Exam:   Vitals:   07/11/22 1959 07/12/22 0018  BP: 113/89 (!) 84/62  Pulse: 79 79  Resp: 18 18  Temp: 98.4 F (36.9 C) 98 F (36.7 C)  SpO2: 94% 90%   Vitals:   07/11/22 1210 07/11/22 1815 07/11/22 1959 07/12/22 0018  BP: 95/73 (!) 133/98 113/89 (!) 84/62  Pulse: 77 81 79 79  Resp: 16 18 18 18   Temp: (!) 97.5 F (36.4 C) 97.6 F (36.4 C) 98.4 F (36.9 C) 98 F (36.7 C)  TempSrc: Oral Oral Oral Oral  SpO2: 94% 95% 94% 90%  Weight:      Height:        General exam: Appears calm and comfortable. Oriented to person and place   The results of significant diagnostics from this hospitalization (including imaging, microbiology, ancillary and laboratory) are listed below for reference.     Procedures and Diagnostic Studies:   CT Head Wo Contrast  Result Date: 07/06/2022 CLINICAL DATA:  62 year old female with altered mental status. EXAM: CT HEAD WITHOUT CONTRAST TECHNIQUE: Contiguous axial images were obtained from the base of the skull through the vertex without intravenous contrast. RADIATION DOSE REDUCTION: This exam was performed according to the departmental dose-optimization program which includes automated exposure control, adjustment of the mA and/or kV according to patient size and/or use of iterative reconstruction technique. COMPARISON:  Head CT 06/23/2022 and earlier. 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 Fairly extensive similar chronic left cerebellar encephalomalacia. No midline shift, ventriculomegaly, mass effect, evidence of mass lesion, intracranial hemorrhage or evidence of cortically based acute infarction. Advanced chronic, Patchy and confluent, bilateral cerebral white matter hypodensity. Small area of chronic cortical encephalomalacia also in the anterior right MCA territory. Stable gray-white matter differentiation throughout the brain. Vascular: No suspicious intracranial vascular hyperdensity. Calcified atherosclerosis at the skull base. Skull: No acute osseous abnormality identified. Sinuses/Orbits: Visualized paranasal sinuses and mastoids are clear. Other: Visualized orbits and scalp soft tissues are within normal limits. IMPRESSION: 1. No acute intracranial abnormality. 2. Stable non contrast CT appearance of multifocal chronic encephalomalacia, advanced white matter disease. Electronically Signed   By: Odessa Fleming M.D.   On: 07/06/2022 05:19   DG Chest Port 1 View  Result Date: 07/06/2022 CLINICAL DATA:  Altered mental status.  Questionable sepsis EXAM: PORTABLE CHEST 1 VIEW COMPARISON:  Radiograph 06/06/2022 FINDINGS: Stable cardiomediastinal silhouette. Aortic atherosclerotic calcification. New hazy airspace opacity about the right lung medially suspicious for pneumonia. Bibasilar atelectasis/scarring. No pleural effusion or pneumothorax. No displaced rib fractures. IMPRESSION: New hazy opacity in the right lung medially suspicious for pneumonia. Electronically Signed   By: Minerva Fester M.D.   On: 07/06/2022 00:17     Labs:   Basic Metabolic Panel: Recent Labs  Lab 07/07/22 0401 07/08/22 0405 07/09/22 0402 07/10/22 0437 07/11/22 0439  NA 134* 134* 138 134* 135  K 4.0 4.3 3.2* 4.5 3.4*  CL 103 96* 99 97* 98  CO2 21* 27 30 29 31   GLUCOSE 199* 133* 100* 112* 50*  BUN 12 9 10 12 18   CREATININE 0.82 0.66 0.75 0.81 0.81  CALCIUM 8.2* 8.2* 8.3* 8.1* 8.3*  MG 1.4*  1.8 1.8 1.6* 2.4  PHOS 3.2  --   --   --   --    GFR Estimated Creatinine Clearance: 51.7 mL/min (by C-G formula based on SCr of 0.81 mg/dL). Liver Function Tests: Recent Labs  Lab 07/05/22 2334  AST 29  ALT 21  ALKPHOS 100  BILITOT 0.4  PROT 7.7  ALBUMIN 2.5*   No results for input(s): "LIPASE", "AMYLASE" in the last 168 hours. Recent Labs  Lab 07/06/22 0634  AMMONIA 53*   Coagulation profile Recent Labs  Lab 07/05/22 2334  INR 1.2    CBC: Recent Labs  Lab 07/05/22 2334 07/06/22 0538 07/07/22 0401 07/08/22 0405 07/09/22 0402 07/10/22 0437 07/11/22 0439  WBC 12.7*   < > 19.8* 14.8* 13.4* 10.7* 12.1*  NEUTROABS 8.9*  --   --   --   --   --   --   HGB 11.0*   < > 10.1* 10.5* 11.0* 10.5* 9.8*  HCT 33.6*   < > 30.7* 31.1* 32.6* 30.9* 29.1*  MCV 94.1   < > 92.7 89.6 90.8 91.2 91.5  PLT 271   < > 271 323 353 321 314   < > = values in this interval not displayed.   Cardiac Enzymes: No results for input(s): "CKTOTAL", "CKMB", "CKMBINDEX", "TROPONINI" in the last 168 hours. BNP: Invalid input(s): "POCBNP" CBG: Recent Labs  Lab 07/11/22 0810 07/11/22 1155 07/11/22 1727 07/11/22 2004 07/12/22 0740  GLUCAP 166* 140* 311* 335* 79   D-Dimer No results for input(s): "DDIMER" in the last 72 hours. Hgb A1c No results for input(s): "HGBA1C" in the last 72 hours. Lipid Profile No results for input(s): "CHOL", "HDL", "LDLCALC", "TRIG", "CHOLHDL", "LDLDIRECT" in the last 72 hours. Thyroid function studies No results for input(s): "TSH", "T4TOTAL", "T3FREE", "THYROIDAB" in the last 72 hours.  Invalid input(s): "FREET3" Anemia work up No results for input(s): "VITAMINB12", "FOLATE", "FERRITIN", "TIBC", "IRON", "RETICCTPCT" in the last 72 hours. Microbiology Recent Results (from the past 240 hour(s))  Blood Culture (routine x 2)     Status: None   Collection Time: 07/05/22 11:34 PM   Specimen: BLOOD  Result Value Ref Range Status   Specimen Description   Final     BLOOD BLOOD LEFT FOREARM Performed at [facility], [address], [place], [place] [address]    Special Requests   Final    BOTTLES DRAWN AEROBIC AND ANAEROBIC Blood Culture adequate volume Performed at [facility], [address] [place], [place] [address]    Culture   Final    NO GROWTH 5 DAYS Performed at [facility], [address] [place], [place] [address]    Report Status 07/11/2022 FINAL  Final  Blood Culture (routine x 2)     Status: None   Collection Time: 07/05/22 11:41 PM   Specimen: BLOOD  Result Value Ref Range Status   Specimen Description   Final    BLOOD BLOOD RIGHT HAND Performed at [facility], [address] [place], [place] [address]    Special Requests   Final    BOTTLES DRAWN AEROBIC AND ANAEROBIC Blood Culture results may not be optimal  due to an inadequate volume of blood received in culture bottles Performed at [facility], [address], [place], [place] [address]    Culture   Final    NO GROWTH 5 DAYS Performed at [facility], [address], [place], [place] [address]    Report Status 07/11/2022 FINAL  Final  Resp panel by RT-PCR (RSV, Flu A&B, Covid) Anterior Nasal Swab     Status: None   Collection Time: 07/06/22 12:20 AM   Specimen: Anterior Nasal Swab  Result Value Ref Range Status   SARS Coronavirus 2 by RT PCR NEGATIVE NEGATIVE Final    Comment: (NOTE) SARS-CoV-2 target nucleic acids are NOT DETECTED.  The SARS-CoV-2 RNA is generally detectable in upper respiratory specimens during the acute phase of infection. The lowest concentration of SARS-CoV-2 viral copies this assay can detect is 138 copies/mL. A negative result does not preclude SARS-Cov-2 infection and should not be used as the sole basis for treatment or other patient management decisions. A negative result may occur with  improper specimen collection/handling, submission of  specimen other than nasopharyngeal swab, presence of viral mutation(s) within the areas targeted by this assay, and inadequate number of viral copies(<138 copies/mL). A negative result must be combined with clinical observations, patient history, and epidemiological information. The expected result is Negative.  Fact Sheet for Patients:  BloggerCourse.com  Fact Sheet for Healthcare Providers:  SeriousBroker.it  This test is no t yet approved or cleared by the [place] FDA and  has been authorized for detection and/or diagnosis of SARS-CoV-2 by FDA under an Emergency Use Authorization (EUA). This EUA will remain  in effect (meaning this test can be used) for the duration of the COVID-19 declaration under Section 564(b)(1) of the Act, 21 U.S.C.section 360bbb-3(b)(1), unless the authorization is terminated  or revoked sooner.       Influenza A by PCR NEGATIVE NEGATIVE Final   Influenza B by PCR NEGATIVE NEGATIVE Final    Comment: (NOTE) The Xpert Xpress SARS-CoV-2/FLU/RSV plus assay is intended as an aid in the diagnosis of influenza from Nasopharyngeal swab specimens and should not be used as a sole basis for treatment. Nasal washings and aspirates are unacceptable for Xpert Xpress SARS-CoV-2/FLU/RSV testing.  Fact Sheet for Patients: BloggerCourse.com  Fact Sheet for Healthcare Providers: SeriousBroker.it  This test is not yet approved or cleared by the [place] FDA and has been authorized for detection and/or diagnosis of SARS-CoV-2 by FDA under an Emergency Use Authorization (EUA). This EUA will remain in effect (meaning this test can be used) for the duration of the COVID-19 declaration under Section 564(b)(1) of the Act, 21 U.S.C. section 360bbb-3(b)(1), unless the authorization is terminated or revoked.     Resp Syncytial Virus by PCR NEGATIVE NEGATIVE Final     Comment: (NOTE) Fact Sheet for Patients: BloggerCourse.com  Fact Sheet for Healthcare Providers: SeriousBroker.it  This test is not yet approved or cleared by the [place] FDA and has been authorized for detection and/or diagnosis of SARS-CoV-2 by FDA under an Emergency Use Authorization (EUA). This EUA will remain in effect (meaning this test can be used) for the duration of the COVID-19 declaration under Section 564(b)(1) of the Act, 21 U.S.C. section 360bbb-3(b)(1), unless the authorization is terminated or revoked.  Performed at [facility], [address], [place], [place] [address]   MRSA Next Gen by PCR, Nasal     Status: None   Collection Time: 07/06/22  7:43  AM   Specimen: Nasal Mucosa; Nasal Swab  Result Value Ref Range Status   MRSA by PCR Next Gen NOT DETECTED NOT DETECTED Final    Comment: (NOTE) The GeneXpert MRSA Assay (FDA approved for NASAL specimens only), is one component of a comprehensive MRSA colonization surveillance program. It is not intended to diagnose MRSA infection nor to guide or monitor treatment for MRSA infections. Test performance is not FDA approved in patients less than 85 years old. Performed at [facility], [address] [place], [place] [address]      Discharge Instructions:   Discharge Instructions     Diet general   Complete by: As directed    Increase activity slowly   Complete by: As directed       Allergies as of 07/12/2022       Reactions   Cephalosporins Itching, Other (See Comments)   TOLERATED ZOSYN (PIPERACILLIN) BEFORE(??); listed as ALLERGIC on the MAR   Cephalothin Itching, Other (See Comments)   Has tolerated penicillins and IV cephalosporins (Rocephin, Cefepime) since this allergy documented. "Allergic," per MAR   Hydromorphone Itching   Lactose Intolerance (gi) Diarrhea        Medication List     STOP  taking these medications    HumaLOG KwikPen 100 UNIT/ML KwikPen Generic drug: insulin lispro   insulin detemir 100 UNIT/ML injection Commonly known as: LEVEMIR   insulin glargine-yfgn 100 UNIT/ML injection Commonly known as: SEMGLEE   sodium zirconium cyclosilicate 10 g Pack packet Commonly known as: LOKELMA       TAKE these medications    aspirin 81 MG chewable tablet Chew 81 mg by mouth in the morning. What changed: Another medication with the same name was removed. Continue taking this medication, and follow the directions you see here.   atorvastatin 40 MG tablet Commonly known as: LIPITOR Take 1 tablet (40 mg total) by mouth Nightly. What changed: when to take this   buPROPion 150 MG 24 hr tablet Commonly known as: WELLBUTRIN XL Take 150 mg by mouth daily.   divalproex 500 MG DR tablet Commonly known as: DEPAKOTE Take 1 tablet (500 mg total) by mouth every 12 (twelve) hours. What changed: when to take this   feeding supplement (GLUCERNA SHAKE) Liqd Take 237 mLs by mouth 3 (three) times daily between meals.   fludrocortisone 0.1 MG tablet Commonly known as: FLORINEF Take 0.5 tablets (0.05 mg total) by mouth daily.   furosemide 20 MG tablet Commonly known as: LASIX Take 1 tablet (20 mg total) by mouth daily as needed for edema or fluid. What changed:  when to take this reasons to take this   glucose 4 GM chewable tablet Chew 1 tablet by mouth every 2 (two) hours as needed for low blood sugar.   insulin aspart 100 UNIT/ML injection Commonly known as: novoLOG Inject 0-5 Units into the skin at bedtime.   insulin aspart 100 UNIT/ML injection Commonly known as: novoLOG Inject 0-6 Units into the skin 3 (three) times daily with meals.   insulin aspart 100 UNIT/ML injection Commonly known as: novoLOG Inject 2 Units into the skin 3 (three) times daily with meals.   ipratropium-albuterol 0.5-2.5 (3) MG/3ML Soln Commonly known as: DUONEB Take 3 mLs by  nebulization every 6 (six) hours as needed.   metFORMIN 500 MG tablet Commonly known as: GLUCOPHAGE Take 500 mg by mouth 2 (two) times daily.   mirtazapine 15 MG tablet Commonly known as: REMERON Take 1 tablet (15 mg  total) by mouth at bedtime.   multivitamin with minerals Tabs tablet Take 1 tablet by mouth daily.   QUEtiapine 25 MG tablet Commonly known as: SEROQUEL Take 25 mg by mouth daily.   Vitamin D3 125 MCG (5000 UT) Tabs Take 5,000 Units by mouth daily.          Time coordinating discharge: 45 min  Signed:  Joseph Art DO  [facility] 07/12/2022, 8:17 AM

## 2022-07-12 NOTE — Progress Notes (Signed)
Occupational Therapy Treatment Patient Details Name: Brittany [name] MRN: [id number] DOB: 1960/04/28 Today's Date: 07/12/2022   History of present illness Patient is a 62 year old female who presented from [facility]TC SNF with AMS. Patient was admitted with severe sepsis, acute metabolic encephalopathy, CAP, lactic acidosis. PMH: seizure disorder, colectomy with colostomy, depression, dementia, anxiety, othostatic   OT comments  Pt pleasant and agreeable for OOB to chair. Max assist for bed mobility and to stand with RW. Pivoted to chair with min assist. Participated in grooming with min assist. Patient will benefit from continued inpatient follow up therapy, <3 hours/day.    Recommendations for follow up therapy are one component of a multi-disciplinary discharge planning process, led by the attending physician.  Recommendations may be updated based on patient status, additional functional criteria and insurance authorization.    Assistance Recommended at Discharge Frequent or constant Supervision/Assistance  Patient can return home with the following  A lot of help with bathing/dressing/bathroom;A lot of help with walking and/or transfers;Direct supervision/assist for financial management;Help with stairs or ramp for entrance;Assist for transportation;Direct supervision/assist for medications management;Assistance with cooking/housework   Equipment Recommendations  None recommended by OT    Recommendations for Other Services      Precautions / Restrictions Precautions Precautions: Fall Restrictions Weight Bearing Restrictions: No       Mobility Bed Mobility Overal bed mobility: Needs Assistance Bed Mobility: Supine to Sit     Supine to sit: Max assist     General bed mobility comments: assist to initiate, for LEs to EOB and to raise trunk    Transfers Overall transfer level: Needs assistance Equipment used: Rolling walker (2 wheels) Transfers: Sit to/from Stand, Bed to  chair/wheelchair/BSC Sit to Stand: Max assist Stand pivot transfers: Min assist         General transfer comment: assist to rise and steady, posterior bias initially, took small pivot steps to recliner     Balance Overall balance assessment: Needs assistance   Sitting balance-Leahy Scale: Fair     Standing balance support: Bilateral upper extremity supported, Reliant on assistive device for balance Standing balance-Leahy Scale: Poor                             ADL either performed or assessed with clinical judgement   ADL Overall ADL's : Needs assistance/impaired Eating/Feeding: Bed level;Set up   Grooming: Oral care;Wash/dry face;Sitting;Minimal assistance Grooming Details (indicate cue type and reason): assist to sequence, leads with L hand                                    Extremity/Trunk Assessment              Vision       Perception     Praxis      Cognition Arousal/Alertness: Awake/alert Behavior During Therapy: Flat affect Overall Cognitive Status: No family/caregiver present to determine baseline cognitive functioning                                 General Comments: multimodal cues to follow commands        Exercises      Shoulder Instructions       General Comments      Pertinent Vitals/ Pain       Pain Assessment  Pain Assessment: Faces Faces Pain Scale: No hurt  Home Living                                          Prior Functioning/Environment              Frequency  Min 2X/week        Progress Toward Goals  OT Goals(current goals can now be found in the care plan section)  Progress towards OT goals: Progressing toward goals  Acute Rehab OT Goals OT Goal Formulation: Patient unable to participate in goal setting Time For Goal Achievement: 07/21/22 Potential to Achieve Goals: Fair  Plan Discharge plan remains appropriate    Co-evaluation                  AM-PAC OT "6 Clicks" Daily Activity     Outcome Measure   Help from another person eating meals?: A Little Help from another person taking care of personal grooming?: A Little Help from another person toileting, which includes using toliet, bedpan, or urinal?: A Lot Help from another person bathing (including washing, rinsing, drying)?: A Lot Help from another person to put on and taking off regular upper body clothing?: A Little Help from another person to put on and taking off regular lower body clothing?: A Lot 6 Click Score: 15    End of Session Equipment Utilized During Treatment: Gait belt;Rolling walker (2 wheels)  OT Visit Diagnosis: Unsteadiness on feet (R26.81);Other abnormalities of gait and mobility (R26.89);History of falling (Z91.81);Dizziness and giddiness (R42)   Activity Tolerance Patient tolerated treatment well   Patient Left in chair;with call bell/phone within reach;with chair alarm set   Nurse Communication          Time: 1610-9604 OT Time Calculation (min): 24 min  Charges: OT General Charges $OT Visit: 1 Visit OT Treatments $Self Care/Home Management : 23-37 mins  Berna Spare, OTR/L Acute Rehabilitation Services Office: [phone]   Evern Bio 07/12/2022, 11:04 AM

## 2022-07-12 NOTE — Care Management Important Message (Signed)
Important Message  Patient Details IM Letter given. Name: Brittany [name] MRN: [id number] Date of Birth: 02/24/1960   Medicare Important Message Given:  Yes     Caren Macadam 07/12/2022, 11:14 AM

## 2022-07-19 ENCOUNTER — Other Ambulatory Visit: Payer: Self-pay

## 2022-07-19 ENCOUNTER — Encounter: Payer: Self-pay | Admitting: Family Medicine

## 2022-07-19 ENCOUNTER — Non-Acute Institutional Stay: Payer: Medicare Other | Admitting: Family Medicine

## 2022-07-19 ENCOUNTER — Emergency Department (HOSPITAL_COMMUNITY)
Admission: EM | Admit: 2022-07-19 | Discharge: 2022-07-20 | Disposition: A | Discharge status: Home / Self Care | Payer: Medicare Other | Attending: Emergency Medicine | Admitting: Emergency Medicine

## 2022-07-19 ENCOUNTER — Encounter (HOSPITAL_COMMUNITY): Payer: Self-pay

## 2022-07-19 ENCOUNTER — Emergency Department (HOSPITAL_COMMUNITY): Payer: Medicare Other

## 2022-07-19 VITALS — BP 118/70 | HR 100 | Temp 97.7°F | Resp 16

## 2022-07-19 DIAGNOSIS — E860 Dehydration: Secondary | ICD-10-CM | POA: Diagnosis not present

## 2022-07-19 DIAGNOSIS — J449 Chronic obstructive pulmonary disease, unspecified: Secondary | ICD-10-CM | POA: Diagnosis not present

## 2022-07-19 DIAGNOSIS — I110 Hypertensive heart disease with heart failure: Secondary | ICD-10-CM | POA: Diagnosis not present

## 2022-07-19 DIAGNOSIS — I509 Heart failure, unspecified: Secondary | ICD-10-CM | POA: Diagnosis not present

## 2022-07-19 DIAGNOSIS — N179 Acute kidney failure, unspecified: Secondary | ICD-10-CM

## 2022-07-19 DIAGNOSIS — Z7984 Long term (current) use of oral hypoglycemic drugs: Secondary | ICD-10-CM | POA: Diagnosis not present

## 2022-07-19 DIAGNOSIS — D72829 Elevated white blood cell count, unspecified: Secondary | ICD-10-CM | POA: Insufficient documentation

## 2022-07-19 DIAGNOSIS — F17210 Nicotine dependence, cigarettes, uncomplicated: Secondary | ICD-10-CM | POA: Insufficient documentation

## 2022-07-19 DIAGNOSIS — Z79899 Other long term (current) drug therapy: Secondary | ICD-10-CM | POA: Diagnosis not present

## 2022-07-19 DIAGNOSIS — Z794 Long term (current) use of insulin: Secondary | ICD-10-CM | POA: Diagnosis not present

## 2022-07-19 DIAGNOSIS — J9601 Acute respiratory failure with hypoxia: Secondary | ICD-10-CM

## 2022-07-19 DIAGNOSIS — R5381 Other malaise: Secondary | ICD-10-CM | POA: Diagnosis present

## 2022-07-19 DIAGNOSIS — Z7982 Long term (current) use of aspirin: Secondary | ICD-10-CM | POA: Diagnosis not present

## 2022-07-19 DIAGNOSIS — F0390 Unspecified dementia without behavioral disturbance: Secondary | ICD-10-CM | POA: Diagnosis not present

## 2022-07-19 DIAGNOSIS — E119 Type 2 diabetes mellitus without complications: Secondary | ICD-10-CM | POA: Diagnosis not present

## 2022-07-19 DIAGNOSIS — G9340 Encephalopathy, unspecified: Secondary | ICD-10-CM

## 2022-07-19 DIAGNOSIS — E870 Hyperosmolality and hypernatremia: Secondary | ICD-10-CM | POA: Insufficient documentation

## 2022-07-19 LAB — CBC WITH DIFFERENTIAL/PLATELET
Abs Immature Granulocytes: 0.07 10*3/uL (ref 0.00–0.07)
Basophils Absolute: 0 10*3/uL (ref 0.0–0.1)
Basophils Relative: 0 %
Eosinophils Absolute: 0 10*3/uL (ref 0.0–0.5)
Eosinophils Relative: 0 %
HCT: 32.4 % — ABNORMAL LOW (ref 36.0–46.0)
Hemoglobin: 10.5 g/dL — ABNORMAL LOW (ref 12.0–15.0)
Immature Granulocytes: 0 %
Lymphocytes Relative: 20 %
Lymphs Abs: 3.1 10*3/uL (ref 0.7–4.0)
MCH: 30.9 pg (ref 26.0–34.0)
MCHC: 32.4 g/dL (ref 30.0–36.0)
MCV: 95.3 fL (ref 80.0–100.0)
Monocytes Absolute: 1 10*3/uL (ref 0.1–1.0)
Monocytes Relative: 6 %
Neutro Abs: 11.5 10*3/uL — ABNORMAL HIGH (ref 1.7–7.7)
Neutrophils Relative %: 74 %
Platelets: 348 10*3/uL (ref 150–400)
RBC: 3.4 MIL/uL — ABNORMAL LOW (ref 3.87–5.11)
RDW: 21.9 % — ABNORMAL HIGH (ref 11.5–15.5)
WBC: 15.7 10*3/uL — ABNORMAL HIGH (ref 4.0–10.5)
nRBC: 0.5 % — ABNORMAL HIGH (ref 0.0–0.2)

## 2022-07-19 LAB — URINALYSIS, W/ REFLEX TO CULTURE (INFECTION SUSPECTED)
Bacteria, UA: NONE SEEN
Bilirubin Urine: NEGATIVE
Glucose, UA: NEGATIVE mg/dL
Hgb urine dipstick: NEGATIVE
Ketones, ur: 5 mg/dL — AB
Leukocytes,Ua: NEGATIVE
Nitrite: NEGATIVE
Protein, ur: NEGATIVE mg/dL
Specific Gravity, Urine: 1.019 (ref 1.005–1.030)
pH: 5 (ref 5.0–8.0)

## 2022-07-19 LAB — COMPREHENSIVE METABOLIC PANEL
ALT: 18 U/L (ref 0–44)
AST: 35 U/L (ref 15–41)
Albumin: 2.1 g/dL — ABNORMAL LOW (ref 3.5–5.0)
Alkaline Phosphatase: 106 U/L (ref 38–126)
Anion gap: 10 (ref 5–15)
BUN: 48 mg/dL — ABNORMAL HIGH (ref 8–23)
CO2: 28 mmol/L (ref 22–32)
Calcium: 8.6 mg/dL — ABNORMAL LOW (ref 8.9–10.3)
Chloride: 105 mmol/L (ref 98–111)
Creatinine, Ser: 1.18 mg/dL — ABNORMAL HIGH (ref 0.44–1.00)
GFR, Estimated: 52 mL/min — ABNORMAL LOW (ref >60)
Glucose, Bld: 289 mg/dL — ABNORMAL HIGH (ref 70–99)
Potassium: 4.7 mmol/L (ref 3.5–5.1)
Sodium: 143 mmol/L (ref 135–145)
Total Bilirubin: 0.8 mg/dL (ref 0.3–1.2)
Total Protein: 7.8 g/dL (ref 6.5–8.1)

## 2022-07-19 LAB — LACTIC ACID, PLASMA: Lactic Acid, Venous: 1.9 mmol/L (ref 0.5–1.9)

## 2022-07-19 LAB — CBG MONITORING, ED: Glucose-Capillary: 259 mg/dL — ABNORMAL HIGH (ref 70–99)

## 2022-07-19 MED ORDER — SODIUM CHLORIDE 0.9 % IV BOLUS
1000.0000 mL | Freq: Once | INTRAVENOUS | Status: AC
  Administered 2022-07-19: 1000 mL via INTRAVENOUS
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
## 2022-07-19 NOTE — ED Triage Notes (Signed)
Patient coming from [facility] with c/o abnormal labs. Patient had labs drawn and resulted yesterday. WBC 21.4, BUN 40, creatinine 2.19, eGFR 25. Pt has no complaints at this time. A&Ox1 baseline. 2L O2 [place] baseline.

## 2022-07-19 NOTE — Discharge Instructions (Addendum)
We evaluated Ms. Hornbacher for her abnormal laboratory tests.  We repeated her laboratory tests here and she had mild dehydration, but her laboratory testing is improved from her tests yesterday.  We repeated her testing and her x-ray does not show any signs of a pneumonia, we did not see any signs of a urine infection, and she is not requiring oxygen.  Some of her weakness is likely due to her recent hospitalization and deconditioning.  She should continue to work with physical therapy.  Currently, we do not have a reason to keep her in the hospital for further treatment.  If she develops any new symptoms such as cough, fevers, vomiting, diarrhea, abdominal pain, decreased responsiveness, or any other new symptoms, please bring her back to the emergency department.
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
## 2022-07-19 NOTE — ED Notes (Signed)
[facility] contacted for pt transport back to facility.  ?

## 2022-07-20 NOTE — ED Notes (Signed)
Report given to [facility], paperwork provided. Pt left ED via stretcher with EMS.

## 2022-07-20 NOTE — ED Notes (Signed)
Pt desat to 85% RA. Not given in report that pt is on home O2. In pts history, history of COPD. Pt placed on 2L [place] sating 95%. No distress noted

## 2022-07-21 LAB — CULTURE, BLOOD (ROUTINE X 2)

## 2022-07-24 LAB — CULTURE, BLOOD (ROUTINE X 2)
Culture: NO GROWTH
Special Requests: ADEQUATE

## 2022-08-17 ENCOUNTER — Inpatient Hospital Stay (HOSPITAL_COMMUNITY)
Admission: EM | Admit: 2022-08-17 | Discharge: 2022-08-23 | DRG: 871 | Disposition: A | Discharge status: Skilled Nursing Facility | Payer: Medicare Other | Source: Skilled Nursing Facility | Attending: Critical Care Medicine | Admitting: Critical Care Medicine

## 2022-08-17 ENCOUNTER — Encounter (HOSPITAL_COMMUNITY): Payer: Self-pay

## 2022-08-17 ENCOUNTER — Other Ambulatory Visit: Payer: Self-pay

## 2022-08-17 ENCOUNTER — Emergency Department (HOSPITAL_COMMUNITY): Payer: Medicare Other

## 2022-08-17 DIAGNOSIS — I951 Orthostatic hypotension: Secondary | ICD-10-CM | POA: Diagnosis present

## 2022-08-17 DIAGNOSIS — E872 Acidosis, unspecified: Secondary | ICD-10-CM | POA: Diagnosis present

## 2022-08-17 DIAGNOSIS — I5032 Chronic diastolic (congestive) heart failure: Secondary | ICD-10-CM | POA: Diagnosis present

## 2022-08-17 DIAGNOSIS — F32A Depression, unspecified: Secondary | ICD-10-CM | POA: Diagnosis present

## 2022-08-17 DIAGNOSIS — L89152 Pressure ulcer of sacral region, stage 2: Secondary | ICD-10-CM | POA: Diagnosis present

## 2022-08-17 DIAGNOSIS — Z8507 Personal history of malignant neoplasm of pancreas: Secondary | ICD-10-CM

## 2022-08-17 DIAGNOSIS — G9340 Encephalopathy, unspecified: Secondary | ICD-10-CM | POA: Diagnosis not present

## 2022-08-17 DIAGNOSIS — F0393 Unspecified dementia, unspecified severity, with mood disturbance: Secondary | ICD-10-CM | POA: Diagnosis present

## 2022-08-17 DIAGNOSIS — Z6821 Body mass index (BMI) 21.0-21.9, adult: Secondary | ICD-10-CM

## 2022-08-17 DIAGNOSIS — R6521 Severe sepsis with septic shock: Secondary | ICD-10-CM | POA: Diagnosis present

## 2022-08-17 DIAGNOSIS — Z7984 Long term (current) use of oral hypoglycemic drugs: Secondary | ICD-10-CM

## 2022-08-17 DIAGNOSIS — Z888 Allergy status to other drugs, medicaments and biological substances status: Secondary | ICD-10-CM

## 2022-08-17 DIAGNOSIS — J440 Chronic obstructive pulmonary disease with acute lower respiratory infection: Secondary | ICD-10-CM | POA: Diagnosis present

## 2022-08-17 DIAGNOSIS — Z1152 Encounter for screening for COVID-19: Secondary | ICD-10-CM

## 2022-08-17 DIAGNOSIS — A0472 Enterocolitis due to Clostridium difficile, not specified as recurrent: Secondary | ICD-10-CM | POA: Diagnosis present

## 2022-08-17 DIAGNOSIS — G2581 Restless legs syndrome: Secondary | ICD-10-CM | POA: Diagnosis present

## 2022-08-17 DIAGNOSIS — Z66 Do not resuscitate: Secondary | ICD-10-CM | POA: Diagnosis present

## 2022-08-17 DIAGNOSIS — J189 Pneumonia, unspecified organism: Secondary | ICD-10-CM

## 2022-08-17 DIAGNOSIS — Z9081 Acquired absence of spleen: Secondary | ICD-10-CM

## 2022-08-17 DIAGNOSIS — E876 Hypokalemia: Secondary | ICD-10-CM | POA: Diagnosis not present

## 2022-08-17 DIAGNOSIS — D638 Anemia in other chronic diseases classified elsewhere: Secondary | ICD-10-CM | POA: Diagnosis present

## 2022-08-17 DIAGNOSIS — J150 Pneumonia due to Klebsiella pneumoniae: Secondary | ICD-10-CM | POA: Diagnosis present

## 2022-08-17 DIAGNOSIS — G9341 Metabolic encephalopathy: Secondary | ICD-10-CM | POA: Diagnosis present

## 2022-08-17 DIAGNOSIS — E785 Hyperlipidemia, unspecified: Secondary | ICD-10-CM | POA: Diagnosis present

## 2022-08-17 DIAGNOSIS — G40909 Epilepsy, unspecified, not intractable, without status epilepticus: Secondary | ICD-10-CM | POA: Diagnosis present

## 2022-08-17 DIAGNOSIS — Z803 Family history of malignant neoplasm of breast: Secondary | ICD-10-CM

## 2022-08-17 DIAGNOSIS — J9600 Acute respiratory failure, unspecified whether with hypoxia or hypercapnia: Secondary | ICD-10-CM | POA: Diagnosis present

## 2022-08-17 DIAGNOSIS — F0394 Unspecified dementia, unspecified severity, with anxiety: Secondary | ICD-10-CM | POA: Diagnosis present

## 2022-08-17 DIAGNOSIS — R6520 Severe sepsis without septic shock: Secondary | ICD-10-CM | POA: Diagnosis present

## 2022-08-17 DIAGNOSIS — Z794 Long term (current) use of insulin: Secondary | ICD-10-CM

## 2022-08-17 DIAGNOSIS — K219 Gastro-esophageal reflux disease without esophagitis: Secondary | ICD-10-CM | POA: Diagnosis present

## 2022-08-17 DIAGNOSIS — L8990 Pressure ulcer of unspecified site, unspecified stage: Secondary | ICD-10-CM | POA: Diagnosis present

## 2022-08-17 DIAGNOSIS — R627 Adult failure to thrive: Secondary | ICD-10-CM | POA: Diagnosis present

## 2022-08-17 DIAGNOSIS — E870 Hyperosmolality and hypernatremia: Secondary | ICD-10-CM | POA: Diagnosis not present

## 2022-08-17 DIAGNOSIS — Z79899 Other long term (current) drug therapy: Secondary | ICD-10-CM

## 2022-08-17 DIAGNOSIS — J9601 Acute respiratory failure with hypoxia: Secondary | ICD-10-CM | POA: Diagnosis present

## 2022-08-17 DIAGNOSIS — I110 Hypertensive heart disease with heart failure: Secondary | ICD-10-CM | POA: Diagnosis present

## 2022-08-17 DIAGNOSIS — Z7401 Bed confinement status: Secondary | ICD-10-CM

## 2022-08-17 DIAGNOSIS — E43 Unspecified severe protein-calorie malnutrition: Secondary | ICD-10-CM | POA: Diagnosis present

## 2022-08-17 DIAGNOSIS — Z515 Encounter for palliative care: Secondary | ICD-10-CM

## 2022-08-17 DIAGNOSIS — E1165 Type 2 diabetes mellitus with hyperglycemia: Secondary | ICD-10-CM | POA: Diagnosis not present

## 2022-08-17 DIAGNOSIS — Z8673 Personal history of transient ischemic attack (TIA), and cerebral infarction without residual deficits: Secondary | ICD-10-CM

## 2022-08-17 DIAGNOSIS — Y95 Nosocomial condition: Secondary | ICD-10-CM | POA: Diagnosis present

## 2022-08-17 DIAGNOSIS — F17210 Nicotine dependence, cigarettes, uncomplicated: Secondary | ICD-10-CM | POA: Diagnosis present

## 2022-08-17 DIAGNOSIS — Z7982 Long term (current) use of aspirin: Secondary | ICD-10-CM

## 2022-08-17 DIAGNOSIS — A419 Sepsis, unspecified organism: Principal | ICD-10-CM | POA: Diagnosis present

## 2022-08-17 DIAGNOSIS — E119 Type 2 diabetes mellitus without complications: Secondary | ICD-10-CM

## 2022-08-17 DIAGNOSIS — E739 Lactose intolerance, unspecified: Secondary | ICD-10-CM | POA: Diagnosis present

## 2022-08-17 DIAGNOSIS — Z933 Colostomy status: Secondary | ICD-10-CM

## 2022-08-17 LAB — CBC WITH DIFFERENTIAL/PLATELET
Abs Immature Granulocytes: 0.15 10*3/uL — ABNORMAL HIGH (ref 0.00–0.07)
Basophils Absolute: 0.1 10*3/uL (ref 0.0–0.1)
Basophils Relative: 1 %
Eosinophils Absolute: 0.1 10*3/uL (ref 0.0–0.5)
Eosinophils Relative: 1 %
HCT: 35.1 % — ABNORMAL LOW (ref 36.0–46.0)
Hemoglobin: 11.1 g/dL — ABNORMAL LOW (ref 12.0–15.0)
Immature Granulocytes: 1 %
Lymphocytes Relative: 16 %
Lymphs Abs: 2.5 10*3/uL (ref 0.7–4.0)
MCH: 30.5 pg (ref 26.0–34.0)
MCHC: 31.6 g/dL (ref 30.0–36.0)
MCV: 96.4 fL (ref 80.0–100.0)
Monocytes Absolute: 2.2 10*3/uL — ABNORMAL HIGH (ref 0.1–1.0)
Monocytes Relative: 14 %
Neutro Abs: 11 10*3/uL — ABNORMAL HIGH (ref 1.7–7.7)
Neutrophils Relative %: 67 %
Platelets: 254 10*3/uL (ref 150–400)
RBC: 3.64 MIL/uL — ABNORMAL LOW (ref 3.87–5.11)
RDW: 23.6 % — ABNORMAL HIGH (ref 11.5–15.5)
WBC: 16 10*3/uL — ABNORMAL HIGH (ref 4.0–10.5)
nRBC: 1.2 % — ABNORMAL HIGH (ref 0.0–0.2)

## 2022-08-17 LAB — POCT I-STAT 7, (LYTES, BLD GAS, ICA,H+H)
Acid-base deficit: 5 mmol/L — ABNORMAL HIGH (ref 0.0–2.0)
Bicarbonate: 19.8 mmol/L — ABNORMAL LOW (ref 20.0–28.0)
Calcium, Ion: 1.23 mmol/L (ref 1.15–1.40)
HCT: 29 % — ABNORMAL LOW (ref 36.0–46.0)
Hemoglobin: 9.9 g/dL — ABNORMAL LOW (ref 12.0–15.0)
O2 Saturation: 92 %
Patient temperature: 37
Potassium: 5 mmol/L (ref 3.5–5.1)
Sodium: 143 mmol/L (ref 135–145)
TCO2: 21 mmol/L — ABNORMAL LOW (ref 22–32)
pCO2 arterial: 34.1 mmHg (ref 32–48)
pH, Arterial: 7.372 (ref 7.35–7.45)
pO2, Arterial: 66 mmHg — ABNORMAL LOW (ref 83–108)

## 2022-08-17 LAB — COMPREHENSIVE METABOLIC PANEL
ALT: 13 U/L (ref 0–44)
AST: 31 U/L (ref 15–41)
Albumin: <1.5 g/dL — ABNORMAL LOW (ref 3.5–5.0)
Alkaline Phosphatase: 74 U/L (ref 38–126)
Anion gap: 23 — ABNORMAL HIGH (ref 5–15)
BUN: 35 mg/dL — ABNORMAL HIGH (ref 8–23)
CO2: 16 mmol/L — ABNORMAL LOW (ref 22–32)
Calcium: 7.4 mg/dL — ABNORMAL LOW (ref 8.9–10.3)
Chloride: 104 mmol/L (ref 98–111)
Creatinine, Ser: 1.52 mg/dL — ABNORMAL HIGH (ref 0.44–1.00)
GFR, Estimated: 39 mL/min — ABNORMAL LOW (ref >60)
Glucose, Bld: 212 mg/dL — ABNORMAL HIGH (ref 70–99)
Potassium: 5.1 mmol/L (ref 3.5–5.1)
Sodium: 143 mmol/L (ref 135–145)
Total Bilirubin: 0.6 mg/dL (ref 0.3–1.2)
Total Protein: 4.9 g/dL — ABNORMAL LOW (ref 6.5–8.1)

## 2022-08-17 LAB — URINALYSIS, W/ REFLEX TO CULTURE (INFECTION SUSPECTED)
Bilirubin Urine: NEGATIVE
Glucose, UA: 150 mg/dL — AB
Hgb urine dipstick: NEGATIVE
Ketones, ur: NEGATIVE mg/dL
Leukocytes,Ua: NEGATIVE
Nitrite: NEGATIVE
Protein, ur: NEGATIVE mg/dL
Specific Gravity, Urine: 1.023 (ref 1.005–1.030)
pH: 5 (ref 5.0–8.0)

## 2022-08-17 LAB — I-STAT ARTERIAL BLOOD GAS, ED
Acid-base deficit: 4 mmol/L — ABNORMAL HIGH (ref 0.0–2.0)
Bicarbonate: 20.3 mmol/L (ref 20.0–28.0)
Calcium, Ion: 1.21 mmol/L (ref 1.15–1.40)
HCT: 35 % — ABNORMAL LOW (ref 36.0–46.0)
Hemoglobin: 11.9 g/dL — ABNORMAL LOW (ref 12.0–15.0)
O2 Saturation: 100 %
Patient temperature: 100
Potassium: 5.8 mmol/L — ABNORMAL HIGH (ref 3.5–5.1)
Sodium: 143 mmol/L (ref 135–145)
TCO2: 21 mmol/L — ABNORMAL LOW (ref 22–32)
pCO2 arterial: 36.4 mmHg (ref 32–48)
pH, Arterial: 7.357 (ref 7.35–7.45)
pO2, Arterial: 232 mmHg — ABNORMAL HIGH (ref 83–108)

## 2022-08-17 LAB — RESP PANEL BY RT-PCR (RSV, FLU A&B, COVID)  RVPGX2
Influenza A by PCR: NEGATIVE
Influenza B by PCR: NEGATIVE
Resp Syncytial Virus by PCR: NEGATIVE
SARS Coronavirus 2 by RT PCR: NEGATIVE

## 2022-08-17 LAB — PROCALCITONIN: Procalcitonin: 2.04 ng/mL

## 2022-08-17 LAB — PROTIME-INR
INR: 1.6 — ABNORMAL HIGH (ref 0.8–1.2)
Prothrombin Time: 19.2 seconds — ABNORMAL HIGH (ref 11.4–15.2)

## 2022-08-17 LAB — GLUCOSE, CAPILLARY
Glucose-Capillary: 259 mg/dL — ABNORMAL HIGH (ref 70–99)
Glucose-Capillary: 308 mg/dL — ABNORMAL HIGH (ref 70–99)

## 2022-08-17 LAB — LACTIC ACID, PLASMA
Lactic Acid, Venous: 4.2 mmol/L (ref 0.5–1.9)
Lactic Acid, Venous: >9 mmol/L (ref 0.5–1.9)
Lactic Acid, Venous: 5.4 mmol/L (ref 0.5–1.9)

## 2022-08-17 LAB — MRSA NEXT GEN BY PCR, NASAL: MRSA by PCR Next Gen: NOT DETECTED

## 2022-08-17 MED ORDER — ETOMIDATE 2 MG/ML IV SOLN
INTRAVENOUS | Status: DC | PRN
  Administered 2022-08-17: 20 mg via INTRAVENOUS

## 2022-08-17 MED ORDER — SUCCINYLCHOLINE CHLORIDE 200 MG/10ML IV SOSY
PREFILLED_SYRINGE | INTRAVENOUS | Status: AC
  Filled 2022-08-17: qty 10

## 2022-08-17 MED ORDER — SODIUM CHLORIDE 0.9 % IV SOLN
2.0000 g | Freq: Once | INTRAVENOUS | Status: DC
  Filled 2022-08-17 (×2): qty 12.5

## 2022-08-17 MED ORDER — INSULIN ASPART 100 UNIT/ML IJ SOLN
0.0000 [IU] | INTRAMUSCULAR | Status: DC
  Administered 2022-08-17: 11 [IU] via SUBCUTANEOUS
  Administered 2022-08-18: 15 [IU] via SUBCUTANEOUS
  Administered 2022-08-18: 3 [IU] via SUBCUTANEOUS
  Administered 2022-08-18 (×3): 11 [IU] via SUBCUTANEOUS
  Administered 2022-08-18: 4 [IU] via SUBCUTANEOUS
  Administered 2022-08-19: 3 [IU] via SUBCUTANEOUS
  Administered 2022-08-19: 11 [IU] via SUBCUTANEOUS
  Administered 2022-08-19: 7 [IU] via SUBCUTANEOUS
  Administered 2022-08-19: 15 [IU] via SUBCUTANEOUS
  Administered 2022-08-20 (×2): 4 [IU] via SUBCUTANEOUS
  Administered 2022-08-20: 3 [IU] via SUBCUTANEOUS
  Administered 2022-08-20: 7 [IU] via SUBCUTANEOUS

## 2022-08-17 MED ORDER — ETOMIDATE 2 MG/ML IV SOLN
INTRAVENOUS | Status: AC
  Filled 2022-08-17: qty 10

## 2022-08-17 MED ORDER — ASPIRIN 81 MG PO CHEW
81.0000 mg | CHEWABLE_TABLET | Freq: Every morning | ORAL | Status: DC
  Administered 2022-08-18 – 2022-08-23 (×5): 81 mg
  Filled 2022-08-17 (×5): qty 1

## 2022-08-17 MED ORDER — LACTATED RINGERS IV BOLUS (SEPSIS)
1000.0000 mL | Freq: Once | INTRAVENOUS | Status: AC
  Administered 2022-08-17: 1000 mL via INTRAVENOUS

## 2022-08-17 MED ORDER — PROPOFOL 1000 MG/100ML IV EMUL
0.0000 ug/kg/min | INTRAVENOUS | Status: DC
  Administered 2022-08-17: 5 ug/kg/min via INTRAVENOUS
  Filled 2022-08-17 (×2): qty 100

## 2022-08-17 MED ORDER — FENTANYL CITRATE PF 50 MCG/ML IJ SOSY: 25.0000 ug | PREFILLED_SYRINGE | INTRAMUSCULAR | Status: DC | PRN

## 2022-08-17 MED ORDER — ORAL CARE MOUTH RINSE: 15.0000 mL | OROMUCOSAL | Status: DC | PRN

## 2022-08-17 MED ORDER — QUETIAPINE FUMARATE 25 MG PO TABS
25.0000 mg | ORAL_TABLET | Freq: Every day | ORAL | Status: DC
  Administered 2022-08-17 – 2022-08-18 (×2): 25 mg
  Filled 2022-08-17 (×2): qty 1

## 2022-08-17 MED ORDER — CHLORHEXIDINE GLUCONATE CLOTH 2 % EX PADS
6.0000 | MEDICATED_PAD | Freq: Every day | CUTANEOUS | Status: DC
  Administered 2022-08-18 – 2022-08-22 (×6): 6 via TOPICAL

## 2022-08-17 MED ORDER — ORAL CARE MOUTH RINSE
15.0000 mL | OROMUCOSAL | Status: DC
  Administered 2022-08-17 – 2022-08-20 (×31): 15 mL via OROMUCOSAL

## 2022-08-17 MED ORDER — ATORVASTATIN CALCIUM 40 MG PO TABS
40.0000 mg | ORAL_TABLET | Freq: Every day | ORAL | Status: DC
  Administered 2022-08-17 – 2022-08-22 (×6): 40 mg
  Filled 2022-08-17 (×6): qty 1

## 2022-08-17 MED ORDER — LACTATED RINGERS IV BOLUS
1000.0000 mL | Freq: Once | INTRAVENOUS | Status: AC
  Administered 2022-08-17: 1000 mL via INTRAVENOUS

## 2022-08-17 MED ORDER — FAMOTIDINE 20 MG PO TABS
20.0000 mg | ORAL_TABLET | Freq: Two times a day (BID) | ORAL | Status: DC
  Administered 2022-08-17 – 2022-08-18 (×2): 20 mg
  Filled 2022-08-17 (×2): qty 1

## 2022-08-17 MED ORDER — HEPARIN SODIUM (PORCINE) 5000 UNIT/ML IJ SOLN
5000.0000 [IU] | Freq: Three times a day (TID) | INTRAMUSCULAR | Status: DC
  Administered 2022-08-17 – 2022-08-23 (×17): 5000 [IU] via SUBCUTANEOUS
  Filled 2022-08-17 (×18): qty 1

## 2022-08-17 MED ORDER — SODIUM CHLORIDE 0.9 % IV SOLN: 2.0000 g | INTRAVENOUS | Status: DC

## 2022-08-17 MED ORDER — LACTATED RINGERS IV SOLN: INTRAVENOUS | Status: AC

## 2022-08-17 MED ORDER — VANCOMYCIN HCL IN DEXTROSE 1-5 GM/200ML-% IV SOLN
1000.0000 mg | Freq: Once | INTRAVENOUS | Status: AC
  Administered 2022-08-17: 1000 mg via INTRAVENOUS

## 2022-08-17 MED ORDER — VANCOMYCIN HCL IN DEXTROSE 1-5 GM/200ML-% IV SOLN
1000.0000 mg | Freq: Once | INTRAVENOUS | Status: DC
  Filled 2022-08-17 (×2): qty 200

## 2022-08-17 MED ORDER — HYDROCORTISONE SOD SUC (PF) 100 MG IJ SOLR
100.0000 mg | Freq: Two times a day (BID) | INTRAMUSCULAR | Status: AC
  Administered 2022-08-17 – 2022-08-18 (×3): 100 mg via INTRAVENOUS
  Filled 2022-08-17 (×3): qty 2

## 2022-08-17 MED ORDER — VANCOMYCIN VARIABLE DOSE PER UNSTABLE RENAL FUNCTION (PHARMACIST DOSING): Status: DC

## 2022-08-17 MED ORDER — ACETAMINOPHEN 650 MG RE SUPP
650.0000 mg | Freq: Once | RECTAL | Status: AC
  Administered 2022-08-17: 650 mg via RECTAL
  Filled 2022-08-17: qty 1

## 2022-08-17 MED ORDER — SUCCINYLCHOLINE CHLORIDE 20 MG/ML IJ SOLN
INTRAMUSCULAR | Status: DC | PRN
  Administered 2022-08-17: 100 mg via INTRAVENOUS

## 2022-08-17 MED ORDER — VALPROIC ACID 250 MG/5ML PO SOLN
500.0000 mg | Freq: Three times a day (TID) | ORAL | Status: DC
  Administered 2022-08-17 – 2022-08-23 (×18): 500 mg
  Filled 2022-08-17 (×19): qty 10

## 2022-08-17 MED ORDER — SODIUM CHLORIDE 0.9 % IV SOLN
500.0000 mg | INTRAVENOUS | Status: DC
  Administered 2022-08-17: 500 mg via INTRAVENOUS
  Filled 2022-08-17: qty 5

## 2022-08-17 MED ORDER — FENTANYL CITRATE PF 50 MCG/ML IJ SOSY
25.0000 ug | PREFILLED_SYRINGE | INTRAMUSCULAR | Status: DC | PRN
  Administered 2022-08-17: 50 ug via INTRAVENOUS
  Administered 2022-08-18 – 2022-08-19 (×2): 100 ug via INTRAVENOUS
  Administered 2022-08-19 – 2022-08-20 (×2): 50 ug via INTRAVENOUS
  Filled 2022-08-17: qty 2
  Filled 2022-08-17: qty 1
  Filled 2022-08-17 (×4): qty 2
  Filled 2022-08-17 (×2): qty 1

## 2022-08-17 MED ORDER — VANCOMYCIN 50 MG/ML ORAL SOLUTION
125.0000 mg | Freq: Four times a day (QID) | ORAL | Status: DC
  Administered 2022-08-17 – 2022-08-23 (×22): 125 mg
  Filled 2022-08-17 (×27): qty 2.5

## 2022-08-17 MED ORDER — SODIUM CHLORIDE 0.9 % IV SOLN
1.0000 g | INTRAVENOUS | Status: DC
  Administered 2022-08-17: 1 g via INTRAVENOUS
  Filled 2022-08-17: qty 10

## 2022-08-17 MED ORDER — SODIUM CHLORIDE 0.9 % IV SOLN
2.0000 g | Freq: Once | INTRAVENOUS | Status: AC
  Administered 2022-08-17: 2 g via INTRAVENOUS

## 2022-08-17 NOTE — ED Provider Notes (Signed)
Brittany [name]   CSN: [id number] Arrival date & time: 08/17/22  1441     History  Chief Complaint  Patient presents with   Code Sepsis    Brittany [name] is a 62 y.o. female.  Pt is a 62 yo female with pmhx significant for DM, HTN, Lupus, pancreatic cancer, dementia, CVA, Seizure d/o, RLS, COPD, and c.diff.  Pt was diagnosed with c.diff at the facility 2 days ago.  She has been on oral vancomycin.  Per EMS, she has been altered starting today.  EMS gives hx and said she has been unresponsive for them.  O2 sats were in the 80s on her normal 3L.  EMS also could not get a BP and so pt was given 1L NS en route.  BP normal upon arrival here.       Home Medications Prior to Admission medications   Medication Sig Start Date End Date Taking? Authorizing Provider  aspirin 81 MG chewable tablet Chew 81 mg by mouth in the morning.    [provider]  atorvastatin (LIPITOR) 40 MG tablet Take 1 tablet (40 mg total) by mouth Nightly. Patient taking differently: Take 40 mg by mouth at bedtime. 03/14/21 07/06/22  Enedina Finner, MD  buPROPion (WELLBUTRIN XL) 150 MG 24 hr tablet Take 150 mg by mouth daily.    [provider]  Cholecalciferol (VITAMIN D3) 125 MCG (5000 UT) TABS Take 5,000 Units by mouth daily.    [provider]  divalproex (DEPAKOTE) 500 MG DR tablet Take 1 tablet (500 mg total) by mouth every 12 (twelve) hours. Patient taking differently: Take 500 mg by mouth 3 (three) times daily. 05/17/22   Tresa Moore, MD  feeding supplement, GLUCERNA SHAKE, (GLUCERNA SHAKE) LIQD Take 237 mLs by mouth 3 (three) times daily between meals. 08/24/21   Enedina Finner, MD  fludrocortisone (FLORINEF) 0.1 MG tablet Take 0.5 tablets (0.05 mg total) by mouth daily. 02/23/22   Sunnie Nielsen, DO  furosemide (LASIX) 20 MG tablet Take 1 tablet (20 mg total) by mouth daily as needed for edema or fluid. 07/12/22   Joseph Art,  DO  glucose 4 GM chewable tablet Chew 1 tablet by mouth every 2 (two) hours as needed for low blood sugar.    [provider]  insulin aspart (NOVOLOG) 100 UNIT/ML injection Inject 0-5 Units into the skin at bedtime. 07/12/22   Joseph Art, DO  insulin aspart (NOVOLOG) 100 UNIT/ML injection Inject 0-6 Units into the skin 3 (three) times daily with meals. Patient not taking: Reported on 07/19/2022 07/12/22   Joseph Art, DO  insulin aspart (NOVOLOG) 100 UNIT/ML injection Inject 2 Units into the skin 3 (three) times daily with meals. 07/12/22   Joseph Art, DO  ipratropium-albuterol (DUONEB) 0.5-2.5 (3) MG/3ML SOLN Take 3 mLs by nebulization every 6 (six) hours as needed. 07/12/22   Joseph Art, DO  metFORMIN (GLUCOPHAGE) 500 MG tablet Take 500 mg by mouth 2 (two) times daily.    [provider]  mirtazapine (REMERON) 15 MG tablet Take 1 tablet (15 mg total) by mouth at bedtime. 05/17/22   Tresa Moore, MD  Multiple Vitamin (MULTIVITAMIN WITH MINERALS) TABS tablet Take 1 tablet by mouth daily. 08/25/21   Enedina Finner, MD  QUEtiapine (SEROQUEL) 25 MG tablet Take 25 mg by mouth daily.    [provider]      Allergies    Cephalosporins, Cephalothin,  Hydromorphone, and Lactose intolerance (gi)    Review of Systems   Review of Systems  Unable to perform ROS: Patient unresponsive    Physical Exam Updated Vital Signs BP (!) 124/104   Pulse (!) 117   Temp (!) 100.5 F (38.1 C)   Resp (!) 24   Ht 5' (1.524 m)   Wt 50 kg   SpO2 99%   BMI 21.53 kg/m  Physical Exam Vitals and nursing [name] reviewed.  Constitutional:      Appearance: She is ill-appearing.  HENT:     Head: Normocephalic and atraumatic.     Right Ear: External ear normal.     Left Ear: External ear normal.     Nose: Nose normal.     Mouth/Throat:     Mouth: Mucous membranes are dry.  Eyes:     Pupils: Pupils are equal, round, and reactive to light.  Cardiovascular:     Rate and  Rhythm: Regular rhythm. Tachycardia present.     Pulses: Normal pulses.     Heart sounds: Normal heart sounds.  Pulmonary:     Effort: Respiratory distress present.     Breath sounds: Rhonchi present.  Abdominal:     General: Abdomen is flat. Bowel sounds are normal.     Palpations: Abdomen is soft.     Comments: Colostomy bag noted  Musculoskeletal:        General: No deformity.     Cervical back: No rigidity.  Skin:    General: Skin is warm.     Capillary Refill: Capillary refill takes 2 to 3 seconds.  Neurological:     Mental Status: She is unresponsive.  Psychiatric:     Comments: Unable to assess     ED Results / Procedures / Treatments   Labs (all labs ordered are listed, but only abnormal results are displayed) Labs Reviewed  LACTIC ACID, PLASMA - Abnormal; Notable for the following components:      Result Value   Lactic Acid, Venous 4.2 (*)    All other components within normal limits  CBC WITH DIFFERENTIAL/PLATELET - Abnormal; Notable for the following components:   WBC 16.0 (*)    RBC 3.64 (*)    Hemoglobin 11.1 (*)    HCT 35.1 (*)    RDW 23.6 (*)    nRBC 1.2 (*)    All other components within normal limits  PROTIME-INR - Abnormal; Notable for the following components:   Prothrombin Time 19.2 (*)    INR 1.6 (*)    All other components within normal limits  URINALYSIS, W/ REFLEX TO CULTURE (INFECTION SUSPECTED) - Abnormal; Notable for the following components:   Color, Urine AMBER (*)    APPearance HAZY (*)    Glucose, UA 150 (*)    Bacteria, UA RARE (*)    All other components within normal limits  I-STAT ARTERIAL BLOOD GAS, ED - Abnormal; Notable for the following components:   pO2, Arterial 232 (*)    TCO2 21 (*)    Acid-base deficit 4.0 (*)    Potassium 5.8 (*)    HCT 35.0 (*)    Hemoglobin 11.9 (*)    All other components within normal limits  CULTURE, BLOOD (ROUTINE X 2)  CULTURE, BLOOD (ROUTINE X 2)  RESP PANEL BY RT-PCR (RSV, FLU A&B, COVID)   RVPGX2  SARS CORONAVIRUS 2 BY RT PCR  LACTIC ACID, PLASMA  COMPREHENSIVE METABOLIC PANEL    EKG EKG Interpretation Date/Time:  Friday August 17 2022 15:51:09 EDT Ventricular Rate:  117 PR Interval:  133 QRS Duration:  74 QT Interval:  282 QTC Calculation: 394 R Axis:   81  Text Interpretation: Sinus tachycardia Low voltage with right axis deviation Borderline repolarization abnormality No significant change since last tracing Confirmed by Jacalyn Lefevre [phone]) on 08/17/2022 3:54:44 PM  Radiology DG Chest Portable 1 View  Result Date: 08/17/2022 CLINICAL DATA:  Sepsis EXAM: PORTABLE CHEST 1 VIEW COMPARISON:  06/16/2022 FINDINGS: Endotracheal tube tip 4.7 cm above the carina. Nasogastric tube enters stomach. Scarring of both lung bases, right greater than left. Hazy right perihilar density could reflect mild pneumonitis or early pneumonia. Cardiac and mediastinal margins appear normal. Emphysema noted. IMPRESSION: 1. Hazy right perihilar density could reflect mild pneumonitis or early pneumonia. 2. Scarring of both lung bases, right greater than left. 3.  Emphysema (ICD10-J43.9). Electronically Signed   By: Gaylyn Rong M.D.   On: 08/17/2022 16:09   DG Abd Portable 1 View  Result Date: 08/17/2022 CLINICAL DATA:  Sepsis EXAM: PORTABLE ABDOMEN - 1 VIEW COMPARISON:  04/07/2021 FINDINGS: Nasogastric tube noted with tip in the vicinity of the stomach antrum. Left upper quadrant clips noted. Bandlike density along the right hemidiaphragm likely reflecting scarring. Frothy material projecting over the left abdominal wall likely within an ostomy bag. Unremarkable bowel gas pattern. Aortoiliac atherosclerotic vascular disease. Bilateral tubal ligation clips noted. IMPRESSION: 1. Nasogastric tube tip in the vicinity of the stomach antrum. 2. Frothy material projecting over the left abdominal wall likely within an ostomy bag. 3.  Aortic Atherosclerosis (ICD10-I70.0). Electronically Signed   By: Gaylyn Rong M.D.   On: 08/17/2022 16:01    Procedures Procedures    Medications Ordered in ED Medications  etomidate (AMIDATE) injection ( Intravenous Canceled Entry 08/17/22 1500)  succinylcholine (ANECTINE) injection ( Intravenous Canceled Entry 08/17/22 1500)  lactated ringers infusion (has no administration in time range)  lactated ringers bolus 1,000 mL (1,000 mLs Intravenous New Bag/Given 08/17/22 1600)  propofol (DIPRIVAN) 1000 MG/100ML infusion (5 mcg/kg/min  50 kg Intravenous New Bag/Given 08/17/22 1554)  cefTRIAXone (ROCEPHIN) 1 g in sodium chloride 0.9 % 100 mL IVPB (0 g Intravenous Stopped 08/17/22 1629)  azithromycin (ZITHROMAX) 500 mg in sodium chloride 0.9 % 250 mL IVPB (500 mg Intravenous New Bag/Given 08/17/22 1559)  vancomycin (VANCOCIN) 50 mg/mL oral solution SOLN 125 mg (has no administration in time range)  acetaminophen (TYLENOL) suppository 650 mg (has no administration in time range)  lactated ringers bolus 1,000 mL (has no administration in time range)  lactated ringers bolus 1,000 mL (0 mLs Intravenous Stopped 08/17/22 1600)    ED Course/ Medical Decision Making/ A&P                             Medical Decision Making Amount and/or Complexity of Data Reviewed Labs: ordered. Radiology: ordered. ECG/medicine tests: ordered.  Risk OTC drugs. Prescription drug management.   This patient presents to the ED for concern of unresponsive, this involves an extensive number of treatment options, and is a complaint that carries with it a high risk of complications and morbidity.  The differential diagnosis includes sepsis, infection, electrolyte abn, hypercarbia   Co morbidities that complicate the patient evaluation  DM, HTN, Lupus, pancreatic cancer, dementia, CVA, Seizure d/o, RLS, COPD, and c.diff   Additional history obtained:  Additional history obtained from epic chart review External records from outside source obtained and reviewed including EMS  report  Lab Tests:  I Ordered, and personally interpreted labs.  The pertinent results include:  cbc with wbc elevated at 16, hgb 11.1 (hgb 10.5 on 6/6), lactic 4.2, inr 1.6, ua without infection; cmp pending upon admission.   Imaging Studies ordered:  I ordered imaging studies including cxr and abd  I independently visualized and interpreted imaging which showed  CXR: Hazy right perihilar density could reflect mild pneumonitis or  early pneumonia.  2. Scarring of both lung bases, right greater than left.  3.  Emphysema (ICD10-J43.9).  ABD: Nasogastric tube tip in the vicinity of the stomach antrum.  2. Frothy material projecting over the left abdominal wall likely  within an ostomy bag.  3.  Aortic Atherosclerosis (ICD10-I70.0).   I agree with the radiologist interpretation   Cardiac Monitoring:  The patient was maintained on a cardiac monitor.  I personally viewed and interpreted the cardiac monitored which showed an underlying rhythm of: st   Medicines ordered and prescription drug management:  I ordered medication including rocephin/zithromax  for pna; vancomycin orally Reevaluation of the patient after these medicines showed that the patient improved I have reviewed the patients home medicines and have made adjustments as needed  Critical Interventions:  intubation   Consultations Obtained:  I requested consultation CCM,  and discussed lab and imaging findings as well as pertinent plan - they will admit  Problem List / ED Course:  Resp failure:  pt does have a hx of copd, so possible hypercarbia prior to intubation.  ABG after shows a pH of 7.36 with pco2 36.4.  She was hypoxic when EMS arrived. Pna:  pt given rocephin/zithromax C.diff:  oral vancomycin Lactic acidosis:  pt given ivfs   Reevaluation:  After the interventions noted above, I reevaluated the patient and found that they have :improved   Social Determinants of Health:  Lives in a snf.   Ward of the state.   Dispostion:  After consideration of the diagnostic results and the patients response to treatment, I feel that the patent would benefit from admission.  CRITICAL CARE Performed by: Jacalyn Lefevre   Total critical care time: 30 minutes  Critical care time was exclusive of separately billable procedures and treating other patients.  Critical care was necessary to treat or prevent imminent or life-threatening deterioration.  Critical care was time spent personally by me on the following activities: development of treatment plan with patient and/or surrogate as well as nursing, discussions with consultants, evaluation of patient's response to treatment, examination of patient, obtaining history from patient or surrogate, ordering and performing treatments and interventions, ordering and review of laboratory studies, ordering and review of radiographic studies, pulse oximetry and re-evaluation of patient's condition.           Final Clinical Impression(s) / ED Diagnoses Final diagnoses:  Sepsis with encephalopathy without septic shock, due to unspecified organism [facility])  Acute respiratory failure with hypoxia (HCC)  Community acquired pneumonia of right lower lobe of lung  C. difficile colitis    Rx / DC Orders ED Discharge Orders     None         Jacalyn Lefevre, MD 08/17/22 [phone]

## 2022-08-17 NOTE — ED Notes (Signed)
Pt intubated

## 2022-08-17 NOTE — Progress Notes (Signed)
eLink Physician-Brief Progress Note Patient Name: Brittany [name] DOB: 1960/11/06 MRN: [id number]   Date of Service  08/17/2022  HPI/Events of Note  Patient admitted with sepsis secondary to pneumonia.  Most recent lactate of 9.  Currently on stress steroids but no pressors.  eICU Interventions  Patient seen and examined on camera.  Intubated and sedated.   Will give 1 L of lactated Ringer solution as a bolus and then repeat lactate as well as ABG. Discussed with bedside nurse.     Intervention Category Major Interventions: Sepsis - evaluation and management  Carilyn Goodpasture 08/17/2022, 8:21 PM

## 2022-08-17 NOTE — Sepsis Progress Note (Signed)
Sepsis protocol monitored by eLink ?

## 2022-08-17 NOTE — Sepsis Progress Note (Signed)
Notified provider of need to order repeat lactic acid. ° °

## 2022-08-17 NOTE — ED Triage Notes (Signed)
Pt from [facility]. Staff c/o AMS. Pt has c-diff and is taking oral vanc. Pt is on NRB and unresponsive on arrival.

## 2022-08-17 NOTE — Progress Notes (Signed)
Pharmacy Antibiotic Note  Brittany [name] is a 62 y.o. female admitted on 08/17/2022 with sepsis 2/2 pneumonia.  Pharmacy has been consulted for Ceftriaxone and Azithromycin dosing. Once CCM was consulted, antibiotics were changed to cefepime and vancomycin - pharmacy consulted to dose.  Patient has reported allergy of itching to cephalothin, an old 1st generation cephalosporin and has tolerated Cefepime and Zosyn in the past. RN notified of history of reaction and to monitor for possible reaction.  WBC 16, Tmax 100.6, LA 4.2 > 5.4 SCr 1.52 (baseline SCr ~0.8)   Plan: Ceftriaxone 1g IV q24h x 1, then d/c'd Azithromycin 500mg  IV q24h x 1, then d/c'd Cefepime 2g IV q24h Initiate loading dose of Vancomycin 1000mg  IV x 1, followed by  Vancomycin variable dosing per unstable renal function   >> Goal vancomycin trough level: 15-20 Monitor daily CBC, temp, SCr, and for clinical signs of improvement  F/u cultures and de-escalate antibiotics as able   Height: 5' (152.4 cm) Weight: 50 kg (110 lb 3.7 oz) IBW/kg (Calculated) : 45.5  Temp (24hrs), Avg:100.6 F (38.1 C), Min:100.6 F (38.1 C), Max:100.6 F (38.1 C)  Recent Labs  Lab 08/17/22 1454  WBC 16.0*    CrCl cannot be calculated (Patient's most recent lab result is older than the maximum 21 days allowed.).    Allergies  Allergen Reactions   Cephalosporins Itching and Other (See Comments)    TOLERATED ZOSYN (PIPERACILLIN) BEFORE(??); listed as ALLERGIC on the MAR   Cephalothin Itching and Other (See Comments)    Has tolerated penicillins and IV cephalosporins (Rocephin, Cefepime) since this allergy documented. "Allergic," per MAR   Hydromorphone Itching   Lactose Intolerance (Gi) Diarrhea    Antimicrobials this admission: Ceftriaxone 7/5 x1 Azithromycin 7/5 x1 Cefepime 7/5 >>  Vancomycin 7/5 >>   Dose adjustments this admission: N/A  Microbiology results: 7/5 BCx: sent 7/5 MRSA PCR: not detected   Thank you for  allowing pharmacy to be a part of this patient's care.  Wilburn Cornelia, PharmD, BCPS Clinical Pharmacist 08/17/2022 11:35 PM   Please refer to [facility] for pharmacy phone number

## 2022-08-17 NOTE — Progress Notes (Signed)
I spoke to the DSS social worker Sharlyne Cai [phone].  She is on call this weekend should consent for procedure be needed. She is currently going to reach out to supervisor as well as family w/ expectation that GOC and perhaps MOST form be addressed early next week.   Simonne Martinet ACNP-BC [facility] Pulmonary/Critical Care Pager # [phone] OR # [phone] if no answer

## 2022-08-17 NOTE — H&P (Addendum)
NAME:  Brittany [name], MRN:  [id number], DOB:  08/26/1960, LOS: 0 ADMISSION DATE:  08/17/2022, CONSULTATION DATE:  7/5 REFERRING MD:  Particia Nearing, CHIEF COMPLAINT:  Pneumonia, sepsis and respiratory failure    History of Present Illness:  This is a 62 year old female who resides at SNF. Has hx as outlined below, specifically Lupus, dementia and prior strokes. Has been pretty much bedbound since last hospital dc May 30 when she was discharged after admission for PNA and septic shock and recent dx of C diff which she is on oral vanc.   Presents from SNF to ER 7/5 after being found by nursing home altered. On arrival to ER she was unresponsive and hypoxic sats 80s room 2 lpm She was intubated by EDP. Initial lactic acid 4.2, wbc ct 16, PCXR w/ right sided airspace disease and RLL consolidation and LLL streaky atx. Cultures were sent. She was started on ceftriaxone and azithromycin. Oral vanc continued. PCCM asked to admit     Pertinent  Medical History  Lupus, Dementia (as of early May 2024 ws ambulatory and able to walk and feed self at time of last dc May 30 required max assist to stand w/ rolling walker), prior stroke, HFpEF, COPD, diabetes, orthostatic hypotension, colostomy present, AKI, prior admit for HHNK, Pancreatic mass, depression, dysphagia, HL, anxiety and depression, GERD, intraductal papillary mucinous neoplasm, seizure disorder, cdiff (on oral vanc)), recent hospitalization for sepsis and PNA.    legal guardian from [employer] [place] Laroy Apple [phone]   Significant Hospital Events: Including procedures, antibiotic start and stop dates in addition to other pertinent events   7/5 admitted w/ PNA/sepsis and respiratory failure. Intubated. Cultures sent. Given Ceftriaxone and azith in ER>changed to HCAP coverage cefepime and vanc by PCCM team. Received her 30 ml/kg in ER   Interim History / Subjective:  Sedated on prop   Objective   Blood pressure (Abnormal) 148/101, temperature  (Abnormal) 100.6 F (38.1 C), resp. rate (Abnormal) 26, height 5' (1.524 m), weight 50 kg.    Vent Mode: PRVC FiO2 (%):  [100 %] 100 % Set Rate:  [15 bmp] 15 bmp Vt Set:  [400 mL] 400 mL PEEP:  [5 cmH20] 5 cmH20  No intake or output data in the 24 hours ending 08/17/22 1541 Filed Weights   08/17/22 1500  Weight: 50 kg    Examination: General: chronically ill appearing 62 year old female now sedated on propofol ggt HENT: NCAT no JVD. Orally intubated. No JVD Lungs: scattered rhonchi. Decreased right bases Cardiovascular: tachy RRR Abdomen: soft has colostomy w/ stool.  Extremities: warm and dry no sig edema good pulses  Neuro: sedated no focal def noted GU: foley w/ clear yellow   Resolved Hospital Problem list     Assessment & Plan:  Acute hypoxic respiratory failure 2/2 recurrent PNA. HCAP vs aspiration (favor later) Plan Full vent support VAP bundle  PAD protocol. RASS goal -1 Send resp culture  Trend PCT Vanc and cefepime Needs repeat swallow eval  Needs palliative care consult   Severe sepsis w/ lactic acidosis superimposed on h/o orthostatic hypotension Plan IV abx as above F/u lactate Cont IVFs Start NE for MAP > 65 Stress dose steroids (she is on florinef usually)  Acute metabolic encephalopathy secondary to sepsis and superimposed on underlying dementia, depression and anxiety  Plan Supportive care Cont home Seroquel Holding Wellbutrin as it is XL dosing   Treat sepsis   Weakness and physical deconditioning  Plan Supportive care  C Diff Plan Cont oral Vanc  Seizure disorder Plan Depakote 500mg  tid  Chronic diastolic HF  Plan Tele  Diabetes type II  Plan Ssi   HL Plan Cont statin  H/o colectomy w/ colostomy  Plan Cont ostomy care   H/o pancreatic mass and intraductal papillary mucinous neoplasm Not a candidate for treatments  Best Practice (right click and "Reselect all SmartList Selections" daily)   Diet/type: NPO DVT  prophylaxis: prophylactic heparin  GI prophylaxis: H2B Lines: N/A Foley:  Yes, and it is still needed Code Status:  full code Last date of multidisciplinary goals of care discussion [pending]  Labs   CBC: Recent Labs  Lab 08/17/22 1454  WBC 16.0*  NEUTROABS PENDING  HGB 11.1*  HCT 35.1*  MCV 96.4  PLT 254    Basic Metabolic Panel: No results for input(s): "NA", "K", "CL", "CO2", "GLUCOSE", "BUN", "CREATININE", "CALCIUM", "MG", "PHOS" in the last 168 hours. GFR: CrCl cannot be calculated (Patient's most recent lab result is older than the maximum 21 days allowed.). Recent Labs  Lab 08/17/22 1454  WBC 16.0*    Liver Function Tests: No results for input(s): "AST", "ALT", "ALKPHOS", "BILITOT", "PROT", "ALBUMIN" in the last 168 hours. No results for input(s): "LIPASE", "AMYLASE" in the last 168 hours. No results for input(s): "AMMONIA" in the last 168 hours.  ABG    Component Value Date/Time   PHART 7.21 (L) 08/13/2021 1430   PCO2ART <18 (LL) 08/13/2021 1430   PO2ART 100 08/13/2021 1430   HCO3 18.7 (L) 10/28/2021 1311   TCO2 20 (L) 10/28/2021 1311   ACIDBASEDEF 4.0 (H) 10/28/2021 1311   O2SAT 99 10/28/2021 1311     Coagulation Profile: Recent Labs  Lab 08/17/22 1454  INR 1.6*    Cardiac Enzymes: No results for input(s): "CKTOTAL", "CKMB", "CKMBINDEX", "TROPONINI" in the last 168 hours.  HbA1C: Hgb A1c MFr Bld  Date/Time Value Ref Range Status  07/05/2022 11:34 PM 10.9 (H) 4.8 - 5.6 % Final    Comment:    (NOTE)         Prediabetes: 5.7 - 6.4         Diabetes: >6.4         Glycemic control for adults with diabetes: <7.0   02/14/2022 09:22 AM 11.0 (H) 4.8 - 5.6 % Final    Comment:    (NOTE)         Prediabetes: 5.7 - 6.4         Diabetes: >6.4         Glycemic control for adults with diabetes: <7.0     CBG: No results for input(s): "GLUCAP" in the last 168 hours.  Review of Systems:   Not able   Past Medical History:  She,  has a past  medical history of Abscess, Acute metabolic encephalopathy, C. 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   Surgical History:   Past Surgical History:  Procedure Laterality Date   COLECTOMY WITH COLOSTOMY CREATION/HARTMANN PROCEDURE N/A 03/10/2021   Procedure: COLECTOMY WITH COLOSTOMY CREATION/HARTMANN PROCEDURE;  Surgeon: Leafy Ro, MD;  Location: [facility] ORS;  Service: General;  Laterality: N/A;   PANCREATECTOMY  spleenectomy       Social History:   reports that she has been smoking cigarettes. She has been exposed to tobacco smoke. She has never used smokeless tobacco. She reports that she does not currently use alcohol. She reports that she does not currently use drugs after having used the following drugs: Marijuana.   Family History:  Her family history includes Anxiety disorder in her sister; Breast cancer in her maternal aunt.   Allergies Allergies  Allergen Reactions   Cephalosporins Itching and Other (See Comments)    TOLERATED ZOSYN (PIPERACILLIN) BEFORE(??); listed as ALLERGIC on the MAR   Cephalothin Itching and Other (See Comments)    Has tolerated penicillins and IV cephalosporins (Rocephin, Cefepime) since this allergy documented. "Allergic," per MAR   Hydromorphone Itching   Lactose Intolerance (Gi) Diarrhea     Home Medications  Prior to Admission medications   Medication Sig Start Date End Date Taking? Authorizing Provider  aspirin 81 MG  chewable tablet Chew 81 mg by mouth in the morning.    [provider]  atorvastatin (LIPITOR) 40 MG tablet Take 1 tablet (40 mg total) by mouth Nightly. Patient taking differently: Take 40 mg by mouth at bedtime. 03/14/21 07/06/22  Enedina Finner, MD  buPROPion (WELLBUTRIN XL) 150 MG 24 hr tablet Take 150 mg by mouth daily.    [provider]  Cholecalciferol (VITAMIN D3) 125 MCG (5000 UT) TABS Take 5,000 Units by mouth daily.    [provider]  divalproex (DEPAKOTE) 500 MG DR tablet Take 1 tablet (500 mg total) by mouth every 12 (twelve) hours. Patient taking differently: Take 500 mg by mouth 3 (three) times daily. 05/17/22   Tresa Moore, MD  feeding supplement, GLUCERNA SHAKE, (GLUCERNA SHAKE) LIQD Take 237 mLs by mouth 3 (three) times daily between meals. 08/24/21   Enedina Finner, MD  fludrocortisone (FLORINEF) 0.1 MG tablet Take 0.5 tablets (0.05 mg total) by mouth daily. 02/23/22   Sunnie Nielsen, DO  furosemide (LASIX) 20 MG tablet Take 1 tablet (20 mg total) by mouth daily as needed for edema or fluid. 07/12/22   Joseph Art, DO  glucose 4 GM chewable tablet Chew 1 tablet by mouth every 2 (two) hours as needed for low blood sugar.    [provider]  insulin aspart (NOVOLOG) 100 UNIT/ML injection Inject 0-5 Units into the skin at bedtime. 07/12/22   Joseph Art, DO  insulin aspart (NOVOLOG) 100 UNIT/ML injection Inject 0-6 Units into the skin 3 (three) times daily with meals. Patient not taking: Reported on 07/19/2022 07/12/22   Joseph Art, DO  insulin aspart (NOVOLOG) 100 UNIT/ML injection Inject 2 Units into the skin 3 (three) times daily with meals. 07/12/22   Joseph Art, DO  ipratropium-albuterol (DUONEB) 0.5-2.5 (3) MG/3ML SOLN Take 3 mLs by nebulization every 6 (six) hours as needed. 07/12/22   Joseph Art, DO  metFORMIN (GLUCOPHAGE) 500 MG tablet Take 500 mg by mouth 2 (two) times daily.    [provider]  mirtazapine  (REMERON) 15 MG tablet Take 1 tablet (15 mg total) by mouth at bedtime. 05/17/22   Tresa Moore, MD  Multiple Vitamin (MULTIVITAMIN WITH MINERALS) TABS tablet Take 1 tablet by mouth daily. 08/25/21   Enedina Finner, MD  QUEtiapine (SEROQUEL) 25 MG tablet Take 25 mg by mouth daily.    [provider]     Critical care time: 445 minutes     Simonne Martinet ACNP-BC  [facility] Pulmonary/Critical Care Pager # [phone] OR # [phone] if no answer

## 2022-08-17 NOTE — Progress Notes (Signed)
Pt transported from ED 02 to 3M08 with RN at bedside, no complications were noted.

## 2022-08-18 ENCOUNTER — Inpatient Hospital Stay (HOSPITAL_COMMUNITY): Payer: Medicare Other

## 2022-08-18 DIAGNOSIS — J9601 Acute respiratory failure with hypoxia: Secondary | ICD-10-CM | POA: Diagnosis not present

## 2022-08-18 LAB — BLOOD CULTURE ID PANEL (REFLEXED) - BCID2

## 2022-08-18 LAB — GLUCOSE, CAPILLARY
Glucose-Capillary: 257 mg/dL — ABNORMAL HIGH (ref 70–99)
Glucose-Capillary: 161 mg/dL — ABNORMAL HIGH (ref 70–99)
Glucose-Capillary: 73 mg/dL (ref 70–99)
Glucose-Capillary: 145 mg/dL — ABNORMAL HIGH (ref 70–99)
Glucose-Capillary: 269 mg/dL — ABNORMAL HIGH (ref 70–99)
Glucose-Capillary: 264 mg/dL — ABNORMAL HIGH (ref 70–99)

## 2022-08-18 LAB — MAGNESIUM
Magnesium: 1.4 mg/dL — ABNORMAL LOW (ref 1.7–2.4)
Magnesium: 3.2 mg/dL — ABNORMAL HIGH (ref 1.7–2.4)
Magnesium: 2.7 mg/dL — ABNORMAL HIGH (ref 1.7–2.4)

## 2022-08-18 LAB — LACTIC ACID, PLASMA
Lactic Acid, Venous: 2.4 mmol/L (ref 0.5–1.9)
Lactic Acid, Venous: 1.7 mmol/L (ref 0.5–1.9)

## 2022-08-18 LAB — PHOSPHORUS
Phosphorus: 3.6 mg/dL (ref 2.5–4.6)
Phosphorus: 3.6 mg/dL (ref 2.5–4.6)
Phosphorus: 3 mg/dL (ref 2.5–4.6)

## 2022-08-18 LAB — CULTURE, BLOOD (ROUTINE X 2): Special Requests: ADEQUATE

## 2022-08-18 LAB — CBC
HCT: 25.7 % — ABNORMAL LOW (ref 36.0–46.0)
Hemoglobin: 8.2 g/dL — ABNORMAL LOW (ref 12.0–15.0)
MCH: 31.4 pg (ref 26.0–34.0)
MCHC: 31.9 g/dL (ref 30.0–36.0)
MCV: 98.5 fL (ref 80.0–100.0)
Platelets: 213 10*3/uL (ref 150–400)
RBC: 2.61 MIL/uL — ABNORMAL LOW (ref 3.87–5.11)
RDW: 22.5 % — ABNORMAL HIGH (ref 11.5–15.5)
WBC: 18.9 10*3/uL — ABNORMAL HIGH (ref 4.0–10.5)
nRBC: 0.8 % — ABNORMAL HIGH (ref 0.0–0.2)

## 2022-08-18 LAB — BASIC METABOLIC PANEL
Anion gap: 11 (ref 5–15)
BUN: 33 mg/dL — ABNORMAL HIGH (ref 8–23)
CO2: 18 mmol/L — ABNORMAL LOW (ref 22–32)
Calcium: 7.8 mg/dL — ABNORMAL LOW (ref 8.9–10.3)
Chloride: 109 mmol/L (ref 98–111)
Creatinine, Ser: 1.15 mg/dL — ABNORMAL HIGH (ref 0.44–1.00)
GFR, Estimated: 54 mL/min — ABNORMAL LOW (ref >60)
Glucose, Bld: 302 mg/dL — ABNORMAL HIGH (ref 70–99)
Potassium: 4.8 mmol/L (ref 3.5–5.1)
Sodium: 138 mmol/L (ref 135–145)

## 2022-08-18 LAB — POCT I-STAT 7, (LYTES, BLD GAS, ICA,H+H)
Acid-Base Excess: 0 mmol/L (ref 0.0–2.0)
Bicarbonate: 24.1 mmol/L (ref 20.0–28.0)
Calcium, Ion: 1.27 mmol/L (ref 1.15–1.40)
HCT: 27 % — ABNORMAL LOW (ref 36.0–46.0)
Hemoglobin: 9.2 g/dL — ABNORMAL LOW (ref 12.0–15.0)
O2 Saturation: 97 %
Patient temperature: 37
Potassium: 4.2 mmol/L (ref 3.5–5.1)
Sodium: 143 mmol/L (ref 135–145)
TCO2: 25 mmol/L (ref 22–32)
pCO2 arterial: 38.1 mmHg (ref 32–48)
pH, Arterial: 7.409 (ref 7.35–7.45)
pO2, Arterial: 92 mmHg (ref 83–108)

## 2022-08-18 LAB — TRIGLYCERIDES: Triglycerides: 81 mg/dL (ref <150)

## 2022-08-18 LAB — CULTURE, RESPIRATORY W GRAM STAIN

## 2022-08-18 LAB — PROCALCITONIN: Procalcitonin: 3.36 ng/mL

## 2022-08-18 MED ORDER — FAMOTIDINE 20 MG PO TABS
20.0000 mg | ORAL_TABLET | Freq: Every day | ORAL | Status: DC
  Administered 2022-08-19 – 2022-08-21 (×3): 20 mg
  Filled 2022-08-18 (×3): qty 1

## 2022-08-18 MED ORDER — VITAL HIGH PROTEIN PO LIQD
1000.0000 mL | ORAL | Status: DC
  Administered 2022-08-18: 1000 mL

## 2022-08-18 MED ORDER — PROSOURCE TF20 ENFIT COMPATIBL EN LIQD
60.0000 mL | Freq: Every day | ENTERAL | Status: DC
  Administered 2022-08-18 – 2022-08-19 (×2): 60 mL
  Filled 2022-08-18 (×2): qty 60

## 2022-08-18 MED ORDER — FLUDROCORTISONE ACETATE 0.1 MG PO TABS
0.0500 mg | ORAL_TABLET | Freq: Every day | ORAL | Status: DC
  Administered 2022-08-19 – 2022-08-21 (×3): 0.05 mg
  Filled 2022-08-18 (×4): qty 0.5

## 2022-08-18 MED ORDER — LACTATED RINGERS IV BOLUS
500.0000 mL | Freq: Once | INTRAVENOUS | Status: AC
  Administered 2022-08-18: 500 mL via INTRAVENOUS

## 2022-08-18 MED ORDER — MAGNESIUM SULFATE 4 GM/100ML IV SOLN
4.0000 g | Freq: Once | INTRAVENOUS | Status: AC
  Administered 2022-08-18: 4 g via INTRAVENOUS
  Filled 2022-08-18: qty 100

## 2022-08-18 MED ORDER — SODIUM CHLORIDE 0.9 % IV SOLN
2.0000 g | Freq: Two times a day (BID) | INTRAVENOUS | Status: DC
  Administered 2022-08-18 – 2022-08-21 (×7): 2 g via INTRAVENOUS
  Filled 2022-08-18 (×7): qty 12.5
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
## 2022-08-18 NOTE — Progress Notes (Signed)
Pharmacy Antibiotic Note  Brittany [name] is a 62 y.o. female admitted on 08/17/2022 with sepsis 2/2 pneumonia.  Pharmacy has been consulted for cefepime/vancomycin dosing. SCr trend down to 1.15  Noted - Patient has reported allergy of itching to cephalothin, an old 1st generation cephalosporin but has tolerated Cefepime and Zosyn in the past. No issues reported this admit.   Plan: Adjust cefepime to 2g IV q12h with improving renal function D/c vancomycin per discussion with CCM Monitor clinical progress, c/s, renal function F/u de-escalation plan/LOT, vancomycin levels as indicated   Height: 5' (152.4 cm) Weight: 54.6 kg (120 lb 5.9 oz) IBW/kg (Calculated) : 45.5  Temp (24hrs), Avg:99 F (37.2 C), Min:97.4 F (36.3 C), Max:100.6 F (38.1 C)  Recent Labs  Lab 08/17/22 1454 08/17/22 1848 08/17/22 1920 08/17/22 2212 08/18/22 0130 08/18/22 0553  WBC 16.0*  --   --   --  18.9*  --   CREATININE  --   --  1.52*  --  1.15*  --   LATICACIDVEN 4.2* >9.0*  --  5.4*  --  2.4*     Estimated Creatinine Clearance: 39.3 mL/min (A) (by C-G formula based on SCr of 1.15 mg/dL (H)).    Allergies  Allergen Reactions   Cephalosporins Itching   Dilaudid [Hydromorphone] Itching   Keflin [Cephalothin] Itching   Lactose Intolerance (Gi) Diarrhea    Antimicrobials this admission: Ceftriaxone 7/5 x1 Azithromycin 7/5 x1 Cefepime 7/5 >>  Vancomycin 7/5 >>  Vanc PO PTA >> 7/16 per med rec  Microbiology results: 7/5 Bld Cx: ngtd 7/5 MRSA PCR: neg 7/6 TA -    Leia Alf, PharmD, BCPS Please check AMION for all [facility] contact numbers Clinical Pharmacist 08/18/2022 11:57 AM

## 2022-08-18 NOTE — Procedures (Signed)
Central Venous Catheter Insertion Procedure Note  MAIYA STERLING  [id number]  1960-04-28  Date:08/18/22  Time:10:16 AM   Provider Performing:Caysen Whang D. Harris   Procedure: Insertion of Non-tunneled Central Venous Catheter(36556) with US guidance (08657)    Indication(s) Difficult access  Consent Unable to obtain consent due to emergent nature of procedure.  Anesthesia Topical only with 1% lidocaine   Timeout Verified patient identification, verified procedure, site/side was marked, verified correct patient position, special equipment/implants available, medications/allergies/relevant history reviewed, required imaging and test results available.  Sterile Technique Maximal sterile technique including full sterile barrier drape, hand hygiene, sterile gown, sterile gloves, mask, hair covering, sterile ultrasound probe cover (if used).  Procedure Description Area of catheter insertion was cleaned with chlorhexidine and draped in sterile fashion.  With real-time ultrasound guidance a central venous catheter was placed into the left internal jugular vein. Nonpulsatile blood flow and easy flushing noted in all ports.  The catheter was sutured in place and sterile dressing applied.  Complications/Tolerance None; patient tolerated the procedure well. Chest X-ray is ordered to verify placement for internal jugular or subclavian cannulation.   Chest x-ray is not ordered for femoral cannulation.  EBL Minimal  Specimen(s) None  Odie Rauen D. Harris, NP-C [facility] Personal contact information can be found on Amion  If no contact or response made please call [phone] 08/18/2022, 10:17 AM

## 2022-08-18 NOTE — Progress Notes (Signed)
PHARMACY - PHYSICIAN COMMUNICATION CRITICAL VALUE ALERT - BLOOD CULTURE IDENTIFICATION (BCID)  Brittany [name] is an 62 y.o. female who presented to [facility] on [facility]/17/2022 with a chief complaint of sepsis secondary to PNA.  Assessment:  1/4 GPC - staph epi (+MecA resistance)  - likely contaminant  Name of physician (or Provider) Contacted: Dr. Roxan Hockey  Current antibiotics: Cefepime  Changes to prescribed antibiotics recommended:  No changes recommended  Results for orders placed or performed during the hospital encounter of 08/17/22  Blood Culture ID Panel (Reflexed) (Collected: 08/17/2022  6:47 PM)  Result Value Ref Range   Enterococcus faecalis NOT DETECTED NOT DETECTED   Enterococcus Faecium NOT DETECTED NOT DETECTED   Listeria monocytogenes NOT DETECTED NOT DETECTED   Staphylococcus species DETECTED (A) NOT DETECTED   Staphylococcus aureus (BCID) NOT DETECTED NOT DETECTED   Staphylococcus epidermidis DETECTED (A) NOT DETECTED   Staphylococcus lugdunensis NOT DETECTED NOT DETECTED   Streptococcus species NOT DETECTED NOT DETECTED   Streptococcus agalactiae NOT DETECTED NOT DETECTED   Streptococcus pneumoniae NOT DETECTED NOT DETECTED   Streptococcus pyogenes NOT DETECTED NOT DETECTED   A.calcoaceticus-baumannii NOT DETECTED NOT DETECTED   Bacteroides fragilis NOT DETECTED NOT DETECTED   Enterobacterales NOT DETECTED NOT DETECTED   Enterobacter cloacae complex NOT DETECTED NOT DETECTED   Escherichia coli NOT DETECTED NOT DETECTED   Klebsiella aerogenes NOT DETECTED NOT DETECTED   Klebsiella oxytoca NOT DETECTED NOT DETECTED   Klebsiella pneumoniae NOT DETECTED NOT DETECTED   Proteus species NOT DETECTED NOT DETECTED   Salmonella species NOT DETECTED NOT DETECTED   Serratia marcescens NOT DETECTED NOT DETECTED   Haemophilus influenzae NOT DETECTED NOT DETECTED   Neisseria meningitidis NOT DETECTED NOT DETECTED   Pseudomonas aeruginosa NOT DETECTED NOT DETECTED   Stenotrophomonas  maltophilia NOT DETECTED NOT DETECTED   Candida albicans NOT DETECTED NOT DETECTED   Candida auris NOT DETECTED NOT DETECTED   Candida glabrata NOT DETECTED NOT DETECTED   Candida krusei NOT DETECTED NOT DETECTED   Candida parapsilosis NOT DETECTED NOT DETECTED   Candida tropicalis NOT DETECTED NOT DETECTED   Cryptococcus neoformans/gattii NOT DETECTED NOT DETECTED   Methicillin resistance mecA/C DETECTED (A) NOT DETECTED    Christoper Fabian, PharmD, BCPS Please see amion for complete clinical pharmacist phone list 08/18/2022  8:16 PM

## 2022-08-18 NOTE — Progress Notes (Signed)
NAME:  Brittany [name], MRN:  [id number], DOB:  07-20-1960, LOS: 1 ADMISSION DATE:  08/17/2022, CONSULTATION DATE:  7/5 REFERRING MD:  Particia Nearing, CHIEF COMPLAINT:  Pneumonia, sepsis and respiratory failure    History of Present Illness:  This is a 62 year old female who resides at SNF. Has hx as outlined below, specifically Lupus, dementia and prior strokes. Has been pretty much bedbound since last hospital dc May 30 when she was discharged after admission for PNA and septic shock and recent dx of C diff which she is on oral vanc.   Presents from SNF to ER 7/5 after being found by nursing home altered. On arrival to ER she was unresponsive and hypoxic sats 80s room 2 lpm She was intubated by EDP. Initial lactic acid 4.2, wbc ct 16, PCXR w/ right sided airspace disease and RLL consolidation and LLL streaky atx. Cultures were sent. She was started on ceftriaxone and azithromycin. Oral vanc continued. PCCM asked to admit   Pertinent  Medical History  Lupus, Dementia (as of early May 2024 ws ambulatory and able to walk and feed self at time of last dc May 30 required max assist to stand w/ rolling walker), prior stroke, HFpEF, COPD, diabetes, orthostatic hypotension, colostomy present, AKI, prior admit for HHNK, Pancreatic mass, depression, dysphagia, HL, anxiety and depression, GERD, intraductal papillary mucinous neoplasm, seizure disorder, cdiff (on oral vanc)), recent hospitalization for sepsis and PNA.    legal guardian from DSS Brittany [name] [phone]   Significant Hospital Events: Including procedures, antibiotic start and stop dates in addition to other pertinent events   7/5 admitted w/ PNA/sepsis and respiratory failure. Intubated. Cultures sent. Given Ceftriaxone and azith in ER>changed to HCAP coverage cefepime and vanc by PCCM team. Received her 30 ml/kg in ER  7/6 significant lactic acidosis with lactic acid greater than 9 and coupled with hypotension overnight, received additional  IV hydration  Interim History / Subjective:  Sedated on prop  Objective   Blood pressure 109/70, pulse 81, temperature 97.8 F (36.6 C), temperature source Oral, resp. rate 16, height 5' (1.524 m), weight 54.6 kg, SpO2 100 %.    Vent Mode: PRVC FiO2 (%):  [40 %-100 %] 40 % Set Rate:  [15 bmp] 15 bmp Vt Set:  [400 mL] 400 mL PEEP:  [5 cmH20] 5 cmH20 Plateau Pressure:  [12 cmH20-14 cmH20] 12 cmH20   Intake/Output Summary (Last 24 hours) at 08/18/2022 0724 Last data filed at 08/18/2022 0600 Gross per 24 hour  Intake 6729.84 ml  Output 1000 ml  Net 5729.84 ml   Filed Weights   08/17/22 1500 08/17/22 1700 08/18/22 0350  Weight: 50 kg 50 kg 54.6 kg    Examination: General: Acute on chronically ill appearing middle aged  female lying in bed on mechanical ventilation, in NAD HEENT: ETT, MM pink/moist, PERRL,  Neuro: deeply sedated on prop CV: s1s2 regular rate and rhythm, no murmur, rubs, or gallops,  PULM:  Clear to auscultation, no increased work of breathing, thick secretions  GI: soft, bowel sounds active in all 4 quadrants, non-tender, non-distended Extremities: warm/dry, no edema  Skin: no rashes or lesions  Resolved Hospital Problem list     Assessment & Plan:  Acute hypoxic respiratory failure 2/2 recurrent PNA. HCAP vs aspiration (favor later) P: Continue ventilator support with lung protective strategies  Wean PEEP and FiO2 for sats greater than 90%. Head of bed elevated 30 degrees. Plateau pressures less than 30 cm H20.  Follow intermittent  chest x-ray and ABG.   SAT/SBT as tolerated, mentation preclude extubation  Ensure adequate pulmonary hygiene  Follow cultures  VAP bundle in place  PAD protocol Sputum culture collected, following Continue empiric cefepime and vancomycin, can likely discontinue vancomycin  Severe sepsis w/ lactic acidosis superimposed on h/o orthostatic hypotension P: IV antibiotics as above  Trend lactic Status post aggressive IV  hydration As needed vasopressor support for MAP goal greater than 65 Continue stress dose steroids  Acute metabolic encephalopathy secondary to sepsis and superimposed on underlying dementia, depression and anxiety  Seizure disorder history P: Maintain neuro protective measures; goal for eurothermia, euglycemia, eunatermia, normoxia, and PCO2 goal of 35-40 Nutrition and bowel regiment  Seizure precautions  Aspirations precautions  Continue home Seroquel Home Wellbutrin remains on hold Continue home Depakote  Weakness and physical deconditioning  P: PT/OT/SLP when able Supportive care  C Diff P: Remains on oral vancomycin  Chronic diastolic HF  HLD P: Continuous telemetry Strict intake and output Optimize electrolytes  Diabetes type II  P: Continue SSI CBG goal 140-180 CBG checks every 4  H/o colectomy w/ colostomy  P: Continue ostomy  H/o pancreatic mass and intraductal papillary mucinous neoplasm P: Not a candidate for treatment  Best Practice (right click and "Reselect all SmartList Selections" daily)   Diet/type: NPO DVT prophylaxis: prophylactic heparin  GI prophylaxis: H2B Lines: N/A Foley:  Yes, and it is still needed Code Status:  full code Last date of multidisciplinary goals of care discussion No family at bedside will try to contact later   Critical care time:   CRITICAL CARE Performed by: Donisha Hoch D. Harris   Total critical care time: 39 minutes  Critical care time was exclusive of separately billable procedures and treating other patients.  Critical care was necessary to treat or prevent imminent or life-threatening deterioration.  Critical care was time spent personally by me on the following activities: development of treatment plan with patient and/or surrogate as well as nursing, discussions with consultants, evaluation of patient's response to treatment, examination of patient, obtaining history from patient or surrogate, ordering and  performing treatments and interventions, ordering and review of laboratory studies, ordering and review of radiographic studies, pulse oximetry and re-evaluation of patient's condition.  Kedric Bumgarner D. Harris, NP-C [facility] Personal contact information can be found on Amion  If no contact or response made please call [phone] 08/18/2022, 9:10 AM

## 2022-08-19 ENCOUNTER — Inpatient Hospital Stay (HOSPITAL_COMMUNITY): Payer: Medicare Other

## 2022-08-19 DIAGNOSIS — J9601 Acute respiratory failure with hypoxia: Secondary | ICD-10-CM | POA: Diagnosis not present

## 2022-08-19 LAB — CBC
HCT: 31.1 % — ABNORMAL LOW (ref 36.0–46.0)
Hemoglobin: 10 g/dL — ABNORMAL LOW (ref 12.0–15.0)
MCH: 30.6 pg (ref 26.0–34.0)
MCHC: 32.2 g/dL (ref 30.0–36.0)
MCV: 95.1 fL (ref 80.0–100.0)
Platelets: 277 10*3/uL (ref 150–400)
RBC: 3.27 MIL/uL — ABNORMAL LOW (ref 3.87–5.11)
RDW: 22.8 % — ABNORMAL HIGH (ref 11.5–15.5)
WBC: 24.1 10*3/uL — ABNORMAL HIGH (ref 4.0–10.5)
nRBC: 1.5 % — ABNORMAL HIGH (ref 0.0–0.2)

## 2022-08-19 LAB — BASIC METABOLIC PANEL
Anion gap: 6 (ref 5–15)
BUN: 45 mg/dL — ABNORMAL HIGH (ref 8–23)
CO2: 23 mmol/L (ref 22–32)
Calcium: 8.3 mg/dL — ABNORMAL LOW (ref 8.9–10.3)
Chloride: 111 mmol/L (ref 98–111)
Creatinine, Ser: 1.08 mg/dL — ABNORMAL HIGH (ref 0.44–1.00)
GFR, Estimated: 58 mL/min — ABNORMAL LOW (ref >60)
Glucose, Bld: 372 mg/dL — ABNORMAL HIGH (ref 70–99)
Potassium: 4.1 mmol/L (ref 3.5–5.1)
Sodium: 140 mmol/L (ref 135–145)

## 2022-08-19 LAB — GLUCOSE, CAPILLARY
Glucose-Capillary: 336 mg/dL — ABNORMAL HIGH (ref 70–99)
Glucose-Capillary: 286 mg/dL — ABNORMAL HIGH (ref 70–99)
Glucose-Capillary: 234 mg/dL — ABNORMAL HIGH (ref 70–99)
Glucose-Capillary: 121 mg/dL — ABNORMAL HIGH (ref 70–99)
Glucose-Capillary: 79 mg/dL (ref 70–99)
Glucose-Capillary: 147 mg/dL — ABNORMAL HIGH (ref 70–99)

## 2022-08-19 LAB — CULTURE, BLOOD (ROUTINE X 2): Culture: NO GROWTH

## 2022-08-19 LAB — PHOSPHORUS: Phosphorus: 3 mg/dL (ref 2.5–4.6)

## 2022-08-19 LAB — MAGNESIUM: Magnesium: 2.8 mg/dL — ABNORMAL HIGH (ref 1.7–2.4)

## 2022-08-19 LAB — PROCALCITONIN: Procalcitonin: 4.83 ng/mL

## 2022-08-19 MED ORDER — VITAL AF 1.2 CAL PO LIQD
1000.0000 mL | ORAL | Status: DC
  Administered 2022-08-19: 1000 mL

## 2022-08-19 MED ORDER — ALBUMIN HUMAN 25 % IV SOLN
25.0000 g | Freq: Four times a day (QID) | INTRAVENOUS | Status: AC
  Administered 2022-08-19 – 2022-08-20 (×4): 25 g via INTRAVENOUS
  Filled 2022-08-19 (×4): qty 100

## 2022-08-19 MED ORDER — INSULIN GLARGINE-YFGN 100 UNIT/ML ~~LOC~~ SOLN
10.0000 [IU] | Freq: Every day | SUBCUTANEOUS | Status: DC
  Administered 2022-08-19 – 2022-08-21 (×3): 10 [IU] via SUBCUTANEOUS
  Filled 2022-08-19 (×3): qty 0.1

## 2022-08-19 MED ORDER — INSULIN ASPART 100 UNIT/ML IJ SOLN
4.0000 [IU] | INTRAMUSCULAR | Status: DC
  Administered 2022-08-19 – 2022-08-20 (×4): 4 [IU] via SUBCUTANEOUS

## 2022-08-19 NOTE — Progress Notes (Signed)
NAME:  Brittany [name], MRN:  [id number], DOB:  06-Nov-1960, LOS: 2 ADMISSION DATE:  08/17/2022, CONSULTATION DATE:  7/5 REFERRING MD:  Particia Nearing, CHIEF COMPLAINT:  Pneumonia, sepsis and respiratory failure    History of Present Illness:  This is a 62 year old female who resides at SNF. Has hx as outlined below, specifically Lupus, dementia and prior strokes. Has been pretty much bedbound since last hospital dc May 30 when she was discharged after admission for PNA and septic shock and recent dx of C diff which she is on oral vanc.   Presents from SNF to ER 7/5 after being found by nursing home altered. On arrival to ER she was unresponsive and hypoxic sats 80s room 2 lpm She was intubated by EDP. Initial lactic acid 4.2, wbc ct 16, PCXR w/ right sided airspace disease and RLL consolidation and LLL streaky atx. Cultures were sent. She was started on ceftriaxone and azithromycin. Oral vanc continued. PCCM asked to admit   Pertinent  Medical History  Lupus, Dementia (as of early May 2024 ws ambulatory and able to walk and feed self at time of last dc May 30 required max assist to stand w/ rolling walker), prior stroke, HFpEF, COPD, diabetes, orthostatic hypotension, colostomy present, AKI, prior admit for HHNK, Pancreatic mass, depression, dysphagia, HL, anxiety and depression, GERD, intraductal papillary mucinous neoplasm, seizure disorder, cdiff (on oral vanc)), recent hospitalization for sepsis and PNA.   Legal guardian from DSS Sentrell Laroy Apple [phone]   Significant Hospital Events: Including procedures, antibiotic start and stop dates in addition to other pertinent events   7/5 admitted w/ PNA/sepsis and respiratory failure. Intubated. Cultures sent. Given Ceftriaxone and azith in ER>changed to HCAP coverage cefepime and vanc by PCCM team. Received her 30 ml/kg in ER  7/6 significant lactic acidosis with lactic acid greater than 9 and coupled with hypotension overnight, received additional  IV hydration 7/7 Hypothermia with decreased urine output   Interim History / Subjective:  On no sedation currently   Objective   Blood pressure (!) 114/93, pulse 92, temperature 98.2 F (36.8 C), temperature source Axillary, resp. rate 16, height 5' (1.524 m), weight 55.4 kg, SpO2 100 %.    Vent Mode: PSV;CPAP FiO2 (%):  [40 %] 40 % Set Rate:  [15 bmp] 15 bmp Vt Set:  [400 mL] 400 mL PEEP:  [5 cmH20] 5 cmH20 Pressure Support:  [5 cmH20-8 cmH20] 5 cmH20 Plateau Pressure:  [15 cmH20] 15 cmH20   Intake/Output Summary (Last 24 hours) at 08/19/2022 0828 Last data filed at 08/19/2022 0800 Gross per 24 hour  Intake 2058.33 ml  Output 1000 ml  Net 1058.33 ml    Filed Weights   08/17/22 1700 08/18/22 0350 08/19/22 0500  Weight: 50 kg 54.6 kg 55.4 kg    Examination: General: Acute on chronic ill-appearing elderly female lying in bed on mechanical ventilation in no acute distress HEENT: ETT, MM pink/moist, PERRL,  Neuro: Will arouse to verbal and painful stimuli seen spontaneously moving right upper extremity flicker of movement to left, no movement to the lowers to command CV: s1s2 regular rate and rhythm, no murmur, rubs, or gallops,  PULM: Clear to auscultation bilaterally, no increased work of breathing, no added breath sounds GI: soft, bowel sounds active in all 4 quadrants, non-tender, non-distended, tolerating TF Extremities: warm/dry, no edema  Skin: no rashes or lesions  Resolved Hospital Problem list   Severe septic shock  Assessment & Plan:  Acute hypoxic respiratory failure 2/2  recurrent PNA HCAP vs aspiration (favor later) P: Continue ventilator support with lung protective strategies  Wean PEEP and FiO2 for sats greater than 90%. Head of bed elevated 30 degrees. Plateau pressures less than 30 cm H20.  Follow intermittent chest x-ray and ABG.   SAT/SBT as tolerated, mentation preclude extubation  Ensure adequate pulmonary hygiene  Follow cultures  VAP bundle in  place  PAD protocol  Antibiotics as below will discussed with attending and pharmacy plans to de-escalating antibiotics  Orthostatic hypotension -1 of 2 blood cultures positive for methicillin resistant coag negative staph, likely contaminant P: Home Florinef resumed today Continuous telemetry Supplement albumin   Acute metabolic encephalopathy secondary to sepsis and superimposed on underlying dementia, depression and anxiety  -No longer requiring any sedation for ventilator compliance, will arouse to verbal and painful stimuli but unable to follow commands Seizure disorder history P: Obtain head CT Maintain neuroprotective measures Nutrition and bowel regiment Seizure precautions Continue home Depakote and Seroquel Aspiration precautions  Weakness and physical deconditioning  Protein calorie malnutrition P: PT/OT/SLP when able Supportive care Continue tube feeds Protein supplementation  C Diff -Per report C. difficile positive at SNF P: Oral vancomycin  Chronic diastolic HF  HLD P: Continuous telemetry Strict intake and output Optimize electrolytes  Diabetes type II  P: Continue resistant scale SSI Tube feed coverage Long-acting insulin added CBG checks every 4  H/o colectomy w/ colostomy  P: Ostomy care  H/o pancreatic mass and intraductal papillary mucinous neoplasm P: Not a candidate for treatment  Best Practice (right click and "Reselect all SmartList Selections" daily)   Diet/type: NPO DVT prophylaxis: prophylactic heparin  GI prophylaxis: H2B Lines: N/A Foley:  Yes, and it is still needed Code Status:  full code Last date of multidisciplinary goals of care discussion No family at bedside will try to contact later   Critical care time:   CRITICAL CARE Performed by: Sabrea Sankey D. Harris  Total critical care time: 37 minutes  Critical care time was exclusive of separately billable procedures and treating other patients.  Critical care was  necessary to treat or prevent imminent or life-threatening deterioration.  Critical care was time spent personally by me on the following activities: development of treatment plan with patient and/or surrogate as well as nursing, discussions with consultants, evaluation of patient's response to treatment, examination of patient, obtaining history from patient or surrogate, ordering and performing treatments and interventions, ordering and review of laboratory studies, ordering and review of radiographic studies, pulse oximetry and re-evaluation of patient's condition.  Daniyla Pfahler D. Harris, NP-C [facility] Personal contact information can be found on Amion  If no contact or response made please call [phone] 08/19/2022, 8:28 AM

## 2022-08-19 NOTE — Progress Notes (Signed)
Pt transported to and from CT scan on the ventilator without incident. 

## 2022-08-19 NOTE — Progress Notes (Signed)
Initial Nutrition Assessment  DOCUMENTATION CODES:      INTERVENTION:  D/c vital high protein  Initiate TF's Vital 1.2 @50  ml/hr (1200 ml) Start at 20 ml/hr and advance by 10 ml q6 hours to goal rate   Tube feed at goal rate provides 1440 kcal, 90 g protein, 973 ml fluid  MD to add free water  Monitor electrolytes (K+, mag, phos) for signs of refeeding. MD to replace as necessary   NUTRITION DIAGNOSIS:   Inadequate oral intake related to inability to eat as evidenced by NPO status.   GOAL:   Patient will meet greater than or equal to 90% of their needs   MONITOR:   Skin, TF tolerance, I & O's, Labs, Weight trends, Vent status  REASON FOR ASSESSMENT:   Consult Enteral/tube feeding initiation and management  ASSESSMENT:   62 y.o. female with Pmhx including chronic HFpEF, diabetes, dementia, COPD, lupus, orthostatic hypotension, AKI, pancreatic mass, depression, dysphagia, GERD, seizure disorder, cdiff who was transferred from SNF with acute respiratory distress 2/2 recurrent PNA and sepsis  Per MD notes:  Functional decline since May 2024- patient has been bed bound. Concerns for aspiration. Recent hospitalization for sepsis and PNA.  Recent cdiff dx and finishing abx  NG tube in place with tip located in vicinity of the stomach antrum per xray imaging.   Labs: CBG 286, BUN 45, Cr 1.08, Mag 2.8 Meds: lipitor, maxipime, pepcid, insulin, seroquel, vancocin Wt: RD suspects weight loss but unable to determine since patient is 6.7 L fluid positive 08/19/22 55.4 kg  07/05/22 54.4 kg  06/23/22 54.4 kg  05/21/22 54.3 kg  I/O's: +6.7 L   NUTRITION - FOCUSED PHYSICAL EXAM:  RD working remotely   Diet Order:   Diet Order             Diet NPO time specified  Diet effective now                   EDUCATION NEEDS:   Not appropriate for education at this time  Skin:  Skin Assessment: Skin Integrity Issues: Skin Integrity Issues:: Stage II, DTI DTI:  buttocks Stage II: coccyx, sacrum  Last BM:  7/5 type 7  Height:   Ht Readings from Last 1 Encounters:  08/18/22 5' (1.524 m)    Weight:   Wt Readings from Last 1 Encounters:  08/19/22 55.4 kg    Ideal Body Weight:     BMI:  Body mass index is 23.85 kg/m.  Estimated Nutritional Needs:   Kcal:  1400-1660  Protein:  65-85 g  Fluid:  >1.6 L    Leodis Rains, RDN, LDN  Clinical Nutrition

## 2022-08-20 ENCOUNTER — Inpatient Hospital Stay (HOSPITAL_COMMUNITY): Payer: Medicare Other

## 2022-08-20 DIAGNOSIS — J9601 Acute respiratory failure with hypoxia: Secondary | ICD-10-CM | POA: Diagnosis not present

## 2022-08-20 LAB — CBC
HCT: 26.1 % — ABNORMAL LOW (ref 36.0–46.0)
Hemoglobin: 8.4 g/dL — ABNORMAL LOW (ref 12.0–15.0)
MCH: 30.7 pg (ref 26.0–34.0)
MCHC: 32.2 g/dL (ref 30.0–36.0)
MCV: 95.3 fL (ref 80.0–100.0)
Platelets: 231 10*3/uL (ref 150–400)
RBC: 2.74 MIL/uL — ABNORMAL LOW (ref 3.87–5.11)
RDW: 23.2 % — ABNORMAL HIGH (ref 11.5–15.5)
WBC: 17.3 10*3/uL — ABNORMAL HIGH (ref 4.0–10.5)
nRBC: 3.1 % — ABNORMAL HIGH (ref 0.0–0.2)

## 2022-08-20 LAB — GLUCOSE, CAPILLARY
Glucose-Capillary: 201 mg/dL — ABNORMAL HIGH (ref 70–99)
Glucose-Capillary: 187 mg/dL — ABNORMAL HIGH (ref 70–99)
Glucose-Capillary: 185 mg/dL — ABNORMAL HIGH (ref 70–99)
Glucose-Capillary: 30 mg/dL — CL (ref 70–99)
Glucose-Capillary: 167 mg/dL — ABNORMAL HIGH (ref 70–99)
Glucose-Capillary: 186 mg/dL — ABNORMAL HIGH (ref 70–99)
Glucose-Capillary: 108 mg/dL — ABNORMAL HIGH (ref 70–99)

## 2022-08-20 LAB — MAGNESIUM
Magnesium: 2.6 mg/dL — ABNORMAL HIGH (ref 1.7–2.4)
Magnesium: 2.6 mg/dL — ABNORMAL HIGH (ref 1.7–2.4)
Magnesium: 2.2 mg/dL (ref 1.7–2.4)

## 2022-08-20 LAB — CULTURE, BLOOD (ROUTINE X 2)

## 2022-08-20 LAB — PHOSPHORUS
Phosphorus: 2.2 mg/dL — ABNORMAL LOW (ref 2.5–4.6)
Phosphorus: 2.1 mg/dL — ABNORMAL LOW (ref 2.5–4.6)
Phosphorus: 2.4 mg/dL — ABNORMAL LOW (ref 2.5–4.6)

## 2022-08-20 LAB — CULTURE, RESPIRATORY W GRAM STAIN

## 2022-08-20 LAB — BASIC METABOLIC PANEL
Anion gap: 9 (ref 5–15)
BUN: 44 mg/dL — ABNORMAL HIGH (ref 8–23)
CO2: 25 mmol/L (ref 22–32)
Calcium: 8.9 mg/dL (ref 8.9–10.3)
Chloride: 113 mmol/L — ABNORMAL HIGH (ref 98–111)
Creatinine, Ser: 0.82 mg/dL (ref 0.44–1.00)
GFR, Estimated: >60 mL/min (ref >60)
Glucose, Bld: 234 mg/dL — ABNORMAL HIGH (ref 70–99)
Potassium: 3.4 mmol/L — ABNORMAL LOW (ref 3.5–5.1)
Sodium: 147 mmol/L — ABNORMAL HIGH (ref 135–145)

## 2022-08-20 MED ORDER — ORAL CARE MOUTH RINSE
15.0000 mL | OROMUCOSAL | Status: DC
  Administered 2022-08-20 – 2022-08-23 (×11): 15 mL via OROMUCOSAL

## 2022-08-20 MED ORDER — POTASSIUM PHOSPHATES 15 MMOLE/5ML IV SOLN
15.0000 mmol | Freq: Once | INTRAVENOUS | Status: AC
  Administered 2022-08-20: 15 mmol via INTRAVENOUS
  Filled 2022-08-20: qty 5

## 2022-08-20 MED ORDER — INSULIN ASPART 100 UNIT/ML IJ SOLN
0.0000 [IU] | Freq: Three times a day (TID) | INTRAMUSCULAR | Status: DC
  Administered 2022-08-21: 3 [IU] via SUBCUTANEOUS
  Administered 2022-08-21: 2 [IU] via SUBCUTANEOUS

## 2022-08-20 MED ORDER — DEXTROSE 50 % IV SOLN: 25.0000 g | INTRAVENOUS | Status: AC

## 2022-08-20 MED ORDER — INSULIN ASPART 100 UNIT/ML IJ SOLN
3.0000 [IU] | INTRAMUSCULAR | Status: DC
  Administered 2022-08-20 – 2022-08-22 (×8): 3 [IU] via SUBCUTANEOUS

## 2022-08-20 MED ORDER — POTASSIUM CHLORIDE 20 MEQ PO PACK
20.0000 meq | PACK | Freq: Once | ORAL | Status: AC
  Administered 2022-08-20: 20 meq
  Filled 2022-08-20: qty 1

## 2022-08-20 MED ORDER — ORAL CARE MOUTH RINSE: 15.0000 mL | OROMUCOSAL | Status: DC | PRN

## 2022-08-20 MED ORDER — DEXAMETHASONE SODIUM PHOSPHATE 4 MG/ML IJ SOLN
4.0000 mg | Freq: Once | INTRAMUSCULAR | Status: AC
  Administered 2022-08-20: 4 mg via INTRAVENOUS
  Filled 2022-08-20: qty 1

## 2022-08-20 MED ORDER — DEXTROSE 50 % IV SOLN
INTRAVENOUS | Status: AC
  Administered 2022-08-20: 25 g via INTRAVENOUS
  Filled 2022-08-20: qty 50

## 2022-08-20 NOTE — Progress Notes (Signed)
SLP Cancellation Note  Patient Details Name: Brittany [name] MRN: [id number] DOB: [birth year]   Cancelled treatment:       Reason Eval/Treat Not Completed: Fatigue/lethargy limiting ability to participate. RN reports pt is not ready for bedside swallow eval today. Will f/u as able.    Gwynneth Aliment, M.A., CF-SLP Speech Language Pathology, Acute Rehabilitation Services  Secure Chat preferred [phone]  08/20/2022, 3:32 PM

## 2022-08-20 NOTE — Procedures (Signed)
Cortrak  Person Inserting Tube:  Irem Stoneham L, RD Tube Type:  Cortrak - 43 inches Tube Size:  10 Tube Location:  Left nare Secured by: Bridle Technique Used to Measure Tube Placement:  Marking at nare/corner of mouth Cortrak Secured At:  63 cm   Cortrak Tube Team Note:  Consult received to place a Cortrak feeding tube.   X-ray is required, abdominal x-ray has been ordered by the Cortrak team. Please confirm tube placement before using the Cortrak tube.   If the tube becomes dislodged please keep the tube and contact the Cortrak team at [email or web address] for replacement.  If after hours and replacement cannot be delayed, place a NG tube and confirm placement with an abdominal x-ray.    Gagan Dillion RD, LDN Clinical Dietitian See AMiON for contact information.    

## 2022-08-20 NOTE — Progress Notes (Signed)
NAME:  Brittany [name], MRN:  [id number], DOB:  [birth year], LOS: 3 ADMISSION DATE:  08/17/2022, CONSULTATION DATE:  08/17/22 REFERRING MD:  Particia Nearing, CHIEF COMPLAINT:  pneumonia, sepsis, respiratory failure   History of Present Illness:  Brittany [name] is a 62 y.o. admitted to the ICU for acute respiratory failure due to HCAP vs aspiration pneumonia requiring intubation. Patient was in septic shock which is now resolved with fluid resuscitation and antibiotic treatment. Patient is now off of sedation and remains intubated.   Pertinent  Medical History  Lupus, Dementia (as of early May 2024 ws ambulatory and able to walk and feed self at time of last dc May 30 required max assist to stand w/ rolling walker), prior stroke, HFpEF, COPD, diabetes, orthostatic hypotension, colostomy present, AKI, prior admit for HHNK, Pancreatic mass, depression, dysphagia, HL, anxiety and depression, GERD, intraductal papillary mucinous neoplasm, seizure disorder, cdiff (on oral vanc), recent hospitalization for sepsis and PNA.    Legal guardian from [employer] Sentrell Laroy Apple [phone]   Significant Hospital Events: Including procedures, antibiotic start and stop dates in addition to other pertinent events   7/5 admitted w/ PNA/sepsis and respiratory failure. Intubated. Cultures sent. Given Ceftriaxone and azith in ER>changed to HCAP coverage cefepime and vanc by PCCM team. Received her 30 ml/kg in ER  7/6 significant lactic acidosis with lactic acid greater than 9 and coupled with hypotension overnight, received additional IV hydration 7/7 Hypothermia with decreased urine output, CT Head negative  Interim History / Subjective:  NAEO. CT Head negative yesterday.   Objective   Blood pressure 94/72, pulse 93, temperature 98.2 F (36.8 C), temperature source Oral, resp. rate 18, height 5' (1.524 m), weight 56.2 kg, SpO2 100 %. CVP:  [5 mmHg-6 mmHg] 5 mmHg  Vent Mode: PRVC FiO2 (%):  [40 %] 40 % Set Rate:  [15  bmp] 15 bmp Vt Set:  [400 mL] 400 mL PEEP:  [5 cmH20] 5 cmH20 Pressure Support:  [5 cmH20-8 cmH20] 5 cmH20 Plateau Pressure:  [11 cmH20-12 cmH20] 12 cmH20   Intake/Output Summary (Last 24 hours) at 08/20/2022 0804 Last data filed at 08/20/2022 0600 Gross per 24 hour  Intake 1238.45 ml  Output 1245 ml  Net -6.55 ml   Filed Weights   08/18/22 0350 08/19/22 0500 08/20/22 0500  Weight: 54.6 kg 55.4 kg 56.2 kg    Examination: General: acute on chronic ill appearing, intubated, appears older than stated age HENT: ET tube in place, MMM Lungs: CTAB, no wheezes or rhonchi, mechanical ventilation Cardiovascular: RRR, no murmurs appreciated Abdomen: Soft non-tender, no rebound or guarding Extremities: warm, well perfused, no lower extremity edema Neuro: arouses to verbal stimuli, is not following commands (unclear baseline with dementia)  Resolved Hospital Problem list   Severe septic shock.  Assessment & Plan:  Acute hypoxic respiratory failure 2/2 recurrent pneumonia HCAP vs aspiration  -Attempt extubation today if mental status improving -Continue Cefepime (07/05 - ) -Wean PEEP and FiO2 for sats greater than 90% -Elevate head of bed 30 degrees -Plateau pressures less than 30cm H20 -VAP bundle in place -PAD protocol  Acute metabolic encephalopathy secondary to sepsis superimposed on dementia - Off of sedation, responds to verbal stimuli, unclear if registering commands, Head CT negative. -Maintain neuro-protective measures -Nutrition and bowel regimen -Continue home Depakote and Seroquel -Aspiration precautions  Weakness and physical deconditioning  Protein calorie malnutrition -PT/OT/SLP when able -Supportive care -Continue tube feeds -Protein supplementation  C diff -Monitor I/Os -Oral Vancomycin  Type 2  Diabetes - elevated requiring more short acting -will likely need to increase LAI, watch today -continue resistant scale SSI -tube feed coverage -Semglee 10u -CBG  check q4h  HFpEF - Euvolemic on exam -Telemetry -Strict I/O  Best Practice (right click and "Reselect all SmartList Selections" daily)   Diet/type: tubefeeds DVT prophylaxis: prophylactic heparin  GI prophylaxis: H2B Lines: Central line Foley:  Yes, and it is still needed Code Status:  full code Last date of multidisciplinary goals of care discussion [ 07/08, see my other progress note from today]  Labs   CBC: Recent Labs  Lab 08/17/22 1454 08/17/22 1536 08/17/22 2120 08/18/22 0130 08/18/22 0530 08/19/22 0352 08/20/22 0413  WBC 16.0*  --   --  18.9*  --  24.1* 17.3*  NEUTROABS 11.0*  --   --   --   --   --   --   HGB 11.1*   < > 9.9* 8.2* 9.2* 10.0* 8.4*  HCT 35.1*   < > 29.0* 25.7* 27.0* 31.1* 26.1*  MCV 96.4  --   --  98.5  --  95.1 95.3  PLT 254  --   --  213  --  277 231   < > = values in this interval not displayed.    Basic Metabolic Panel: Recent Labs  Lab 08/17/22 1920 08/17/22 2120 08/18/22 0130 08/18/22 0530 08/18/22 1155 08/18/22 1745 08/19/22 0352 08/20/22 0413  NA 143 143 138 143  --   --  140 147*  K 5.1 5.0 4.8 4.2  --   --  4.1 3.4*  CL 104  --  109  --   --   --  111 113*  CO2 16*  --  18*  --   --   --  23 25  GLUCOSE 212*  --  302*  --   --   --  372* 234*  BUN 35*  --  33*  --   --   --  45* 44*  CREATININE 1.52*  --  1.15*  --   --   --  1.08* 0.82  CALCIUM 7.4*  --  7.8*  --   --   --  8.3* 8.9  MG  --   --  1.4*  --  3.2* 2.7* 2.8* 2.6*  PHOS  --   --  3.6  --  3.6 3.0 3.0 2.1*   GFR: Estimated Creatinine Clearance: 55.9 mL/min (by C-G formula based on SCr of 0.82 mg/dL). Recent Labs  Lab 08/17/22 1454 08/17/22 1848 08/17/22 1920 08/17/22 2212 08/18/22 0130 08/18/22 0553 08/18/22 1315 08/19/22 0352 08/20/22 0413  PROCALCITON  --   --  2.04  --  3.36  --   --  4.83  --   WBC 16.0*  --   --   --  18.9*  --   --  24.1* 17.3*  LATICACIDVEN 4.2* >9.0*  --  5.4*  --  2.4* 1.7  --   --     Liver Function Tests: Recent Labs   Lab 08/17/22 1920  AST 31  ALT 13  ALKPHOS 74  BILITOT 0.6  PROT 4.9*  ALBUMIN <1.5*   No results for input(s): "LIPASE", "AMYLASE" in the last 168 hours. No results for input(s): "AMMONIA" in the last 168 hours.  ABG    Component Value Date/Time   PHART 7.409 08/18/2022 0530   PCO2ART 38.1 08/18/2022 0530   PO2ART 92 08/18/2022 0530   HCO3 24.1 08/18/2022 0530  TCO2 25 08/18/2022 0530   ACIDBASEDEF 5.0 (H) 08/17/2022 2120   O2SAT 97 08/18/2022 0530     Coagulation Profile: Recent Labs  Lab 08/17/22 1454  INR 1.6*    Cardiac Enzymes: No results for input(s): "CKTOTAL", "CKMB", "CKMBINDEX", "TROPONINI" in the last 168 hours.  HbA1C: Hgb A1c MFr Bld  Date/Time Value Ref Range Status  07/05/2022 11:34 PM 10.9 (H) 4.8 - 5.6 % Final    Comment:    (NOTE)         Prediabetes: 5.7 - 6.4         Diabetes: >6.4         Glycemic control for adults with diabetes: <7.0   02/14/2022 09:22 AM 11.0 (H) 4.8 - 5.6 % Final    Comment:    (NOTE)         Prediabetes: 5.7 - 6.4         Diabetes: >6.4         Glycemic control for adults with diabetes: <7.0     CBG: Recent Labs  Lab 08/19/22 1515 08/19/22 1928 08/19/22 2321 08/20/22 0319 08/20/22 0732  GLUCAP 121* 79 147* 201* 187*

## 2022-08-20 NOTE — Progress Notes (Addendum)
Called patient's legal guardian from DSS [employer] [phone].  DSS unaware the patient has been hospitalized since 07/05.  Explained patient's current medical status, plan for extubation today.    Inquired about DNR versus full CODE STATUS for patient.  Ms. Colette Ribas explained that skilled nursing facility was evaluating whether patient would want to be DNR full code. They have not heard the results of that evaluation as of yet.  Explained that given the extent of M[place]ka's comorbid medical conditions that the best course of action for this patient would be to have patient's healthcare power attorney determine that as soon as possible.   Inquired about patient's baseline mental status. Ms. Colette Ribas explained that her dementia is fairly progressed. She will sometimes follow commands but sometimes will not. State she is usually oriented to person.  Also recommended that family be updated.  Ms. Colette Ribas stated that all updates regarding patient's healthcare should go through DSS and they will update family.  Ms. Colette Ribas given floor number will call back with updates.

## 2022-08-20 NOTE — Procedures (Signed)
Extubation Procedure Note  Patient Details:   Name: Brittany [name] DOB: 03-23-1960 MRN: [id number]   Airway Documentation:    Vent end date: 08/20/22 Vent end time: 1038   Evaluation  O2 sats: stable throughout Complications: No apparent complications Patient did tolerate procedure well. Bilateral Breath Sounds: Clear, Diminished   Pt was extubate to 3L  per written order. Pt did not have a cuff leak prior to extubation, MD made aware, MD ordered to proceed with extubation. Post extubation patient did not show signs of stridor, vitals are stable an pt is tolerating well at this time.   No  Miranda Frese J Samuella Bruin 08/20/2022, 10:48 AM

## 2022-08-20 NOTE — Progress Notes (Signed)
[facility] ADULT ICU REPLACEMENT PROTOCOL   The patient does apply for the [facility] Adult ICU Electrolyte Replacment Protocol based on the criteria listed below:   1.Exclusion criteria: TCTS, ECMO, Dialysis, and Myasthenia Gravis patients 2. Is GFR >/= 30 ml/min? Yes.    Patient's GFR today is >60 3. Is SCr </= 2? Yes.   Patient's SCr is 0.82 mg/dL 4. Did SCr increase >/= 0.5 in 24 hours? No. 5.Pt's weight >40kg  Yes.   6. Abnormal electrolyte(s): Phos, K  7. Electrolytes replaced per protocol 8.  Call MD STAT for K+ </= 2.5, Phos </= 1, or Mag </= 1 Physician:  Thersa Salt Blayden Conwell 08/20/2022 5:13 AM

## 2022-08-21 DIAGNOSIS — G9340 Encephalopathy, unspecified: Secondary | ICD-10-CM | POA: Diagnosis not present

## 2022-08-21 DIAGNOSIS — A0472 Enterocolitis due to Clostridium difficile, not specified as recurrent: Secondary | ICD-10-CM

## 2022-08-21 DIAGNOSIS — J150 Pneumonia due to Klebsiella pneumoniae: Secondary | ICD-10-CM

## 2022-08-21 DIAGNOSIS — G9341 Metabolic encephalopathy: Secondary | ICD-10-CM

## 2022-08-21 DIAGNOSIS — A419 Sepsis, unspecified organism: Secondary | ICD-10-CM | POA: Diagnosis not present

## 2022-08-21 DIAGNOSIS — J9601 Acute respiratory failure with hypoxia: Secondary | ICD-10-CM | POA: Diagnosis not present

## 2022-08-21 DIAGNOSIS — R6520 Severe sepsis without septic shock: Secondary | ICD-10-CM | POA: Diagnosis present

## 2022-08-21 LAB — CULTURE, RESPIRATORY W GRAM STAIN

## 2022-08-21 LAB — CBC
HCT: 26.8 % — ABNORMAL LOW (ref 36.0–46.0)
Hemoglobin: 8.6 g/dL — ABNORMAL LOW (ref 12.0–15.0)
MCH: 30.3 pg (ref 26.0–34.0)
MCHC: 32.1 g/dL (ref 30.0–36.0)
MCV: 94.4 fL (ref 80.0–100.0)
Platelets: 240 10*3/uL (ref 150–400)
RBC: 2.84 MIL/uL — ABNORMAL LOW (ref 3.87–5.11)
RDW: 23.9 % — ABNORMAL HIGH (ref 11.5–15.5)
WBC: 17.6 10*3/uL — ABNORMAL HIGH (ref 4.0–10.5)
nRBC: 3.7 % — ABNORMAL HIGH (ref 0.0–0.2)

## 2022-08-21 LAB — BASIC METABOLIC PANEL
Anion gap: 5 (ref 5–15)
BUN: 36 mg/dL — ABNORMAL HIGH (ref 8–23)
CO2: 26 mmol/L (ref 22–32)
Calcium: 8.7 mg/dL — ABNORMAL LOW (ref 8.9–10.3)
Chloride: 115 mmol/L — ABNORMAL HIGH (ref 98–111)
Creatinine, Ser: 0.76 mg/dL (ref 0.44–1.00)
GFR, Estimated: >60 mL/min (ref >60)
Glucose, Bld: 228 mg/dL — ABNORMAL HIGH (ref 70–99)
Potassium: 3.9 mmol/L (ref 3.5–5.1)
Sodium: 146 mmol/L — ABNORMAL HIGH (ref 135–145)

## 2022-08-21 LAB — PHOSPHORUS: Phosphorus: 2.2 mg/dL — ABNORMAL LOW (ref 2.5–4.6)

## 2022-08-21 LAB — GLUCOSE, CAPILLARY
Glucose-Capillary: 140 mg/dL — ABNORMAL HIGH (ref 70–99)
Glucose-Capillary: 144 mg/dL — ABNORMAL HIGH (ref 70–99)
Glucose-Capillary: 153 mg/dL — ABNORMAL HIGH (ref 70–99)
Glucose-Capillary: 190 mg/dL — ABNORMAL HIGH (ref 70–99)
Glucose-Capillary: 230 mg/dL — ABNORMAL HIGH (ref 70–99)
Glucose-Capillary: 82 mg/dL (ref 70–99)

## 2022-08-21 LAB — BLOOD GAS, VENOUS
Acid-Base Excess: 2.2 mmol/L — ABNORMAL HIGH (ref 0.0–2.0)
Bicarbonate: 28.3 mmol/L — ABNORMAL HIGH (ref 20.0–28.0)
Drawn by: 6909
O2 Saturation: 73.5 %
Patient temperature: 37
pCO2, Ven: 49 mmHg (ref 44–60)
pH, Ven: 7.37 (ref 7.25–7.43)
pO2, Ven: 47 mmHg — ABNORMAL HIGH (ref 32–45)

## 2022-08-21 LAB — CULTURE, BLOOD (ROUTINE X 2)

## 2022-08-21 LAB — MAGNESIUM: Magnesium: 2.1 mg/dL (ref 1.7–2.4)

## 2022-08-21 MED ORDER — SODIUM CHLORIDE 0.9 % IV SOLN: 2.0000 g | INTRAVENOUS | Status: DC

## 2022-08-21 MED ORDER — OSMOLITE 1.2 CAL PO LIQD
1000.0000 mL | ORAL | Status: DC
  Administered 2022-08-21 – 2022-08-22 (×2): 1000 mL
  Filled 2022-08-21 (×3): qty 1000

## 2022-08-21 MED ORDER — FREE WATER
100.0000 mL | Status: DC
  Administered 2022-08-21 – 2022-08-22 (×5): 100 mL

## 2022-08-21 MED ORDER — INSULIN GLARGINE-YFGN 100 UNIT/ML ~~LOC~~ SOLN
12.0000 [IU] | Freq: Every day | SUBCUTANEOUS | Status: DC
  Administered 2022-08-22 – 2022-08-23 (×2): 12 [IU] via SUBCUTANEOUS
  Filled 2022-08-21 (×2): qty 0.12

## 2022-08-21 MED ORDER — ADULT MULTIVITAMIN W/MINERALS CH
1.0000 | ORAL_TABLET | Freq: Every day | ORAL | Status: DC
  Administered 2022-08-21 – 2022-08-22 (×2): 1
  Filled 2022-08-21 (×3): qty 1

## 2022-08-21 MED ORDER — SODIUM CHLORIDE 0.9 % IV SOLN
2.0000 g | INTRAVENOUS | Status: AC
  Administered 2022-08-22 – 2022-08-23 (×2): 2 g via INTRAVENOUS
  Filled 2022-08-21 (×2): qty 20

## 2022-08-21 MED ORDER — SODIUM PHOSPHATES 45 MMOLE/15ML IV SOLN
30.0000 mmol | Freq: Once | INTRAVENOUS | Status: AC
  Administered 2022-08-21: 30 mmol via INTRAVENOUS
  Filled 2022-08-21: qty 10

## 2022-08-21 MED ORDER — SODIUM PHOSPHATES 45 MMOLE/15ML IV SOLN
30.0000 mmol | Freq: Once | INTRAVENOUS | Status: DC
  Filled 2022-08-21: qty 10

## 2022-08-21 NOTE — Progress Notes (Signed)
eLink Physician-Brief Progress Note Patient Name: Brittany [name] DOB: 03/12/1960 MRN: [id number]   Date of Service  08/21/2022  HPI/Events of Note  55F with lupus, dementia, CVA, HFpEF, COPD, DMII and colostomy who was admitted 7/5 for altered mental status, acute hypoxemic respiratory failure requiring intubation and mechanical ventilation.   Extubated 6/8.  Bradypnea since extubation.  No immediate distress.  eICU Interventions  VBG with morning labs  Discontinue fentanyl     Intervention Category Minor Interventions: Clinical assessment - ordering diagnostic tests  Fannie Gathright 08/21/2022, 1:35 AM

## 2022-08-21 NOTE — Progress Notes (Signed)
IV access attempted by IV Team w/ ultrasound, attempt was unsuccessful. No other appropriate veins seen for PIV attempts. Floor RN aware.

## 2022-08-21 NOTE — Evaluation (Signed)
Physical Therapy Evaluation/discharge Patient Details Name: Brittany [name] MRN: [id number] DOB: 07/08/1960 Today's Date: 08/21/2022  History of Present Illness  62 yo female admitted 7/5 from LTC SNF [facility]) with respiratory distress requiring intubation. Extubated 7/8. PMHx: seizure disorder, colectomy with colostomy, depression, dementia, anxiety, T2DM, HTN, HLD, CVA, insomnia, Lupus  Clinical Impression  Pt with flat affect, no verbalizations or response to cues/commands. Pt max assist to transition supine<>sit with SPO2 90-95% on 3L throughout. Pt with assist for sitting balance and all functional movement with limited automatic movement of UB noted. Pt without family present to confirm PLOF but from LTC SNF and no answer when attempted to call both legal guardians listed. Anticipate pt requires significant if not total assist at baseline and without current ability to actively participate or engage in therapy pt not appropriate for acute therapy. Will sign off and please reorder should pt status/cognition change.       Assistance Recommended at Discharge Frequent or constant Supervision/Assistance  If plan is discharge home, recommend the following:  Can travel by private vehicle  Two people to help with walking and/or transfers;Two people to help with bathing/dressing/bathroom;Assistance with feeding;Assistance with cooking/housework;Direct supervision/assist for medications management;Assist for transportation   No    Equipment Recommendations Wheelchair (measurements PT);Hospital bed;Wheelchair cushion (measurements PT);Other (comment) (hoyer)  Recommendations for Other Services       Functional Status Assessment Patient has had a recent decline in their functional status and/or demonstrates limited ability to make significant improvements in function in a reasonable and predictable amount of time     Precautions / Restrictions Precautions Precaution Comments: cortrak       Mobility  Bed Mobility Overal bed mobility: Needs Assistance Bed Mobility: Supine to Sit, Sit to Supine     Supine to sit: Total assist Sit to supine: Total assist   General bed mobility comments: pt currently total assist to transition from supine<>sit with min assist for sitting balance EOB. Pt with automatic movement of UB but without significant effort to assist with mobility or balance. No change in verbalization with sitting    Transfers                        Ambulation/Gait                  Stairs            Wheelchair Mobility     Tilt Bed    Modified Rankin (Stroke Patients Only)       Balance Overall balance assessment: Needs assistance   Sitting balance-Leahy Scale: Poor                                       Pertinent Vitals/Pain Pain Assessment Pain Assessment: PAINAD Faces Pain Scale: No hurt Breathing: normal Negative Vocalization: none Facial Expression: smiling or inexpressive Body Language: relaxed Consolability: no need to console PAINAD Score: 0    Home Living Family/patient expects to be discharged to:: Skilled nursing facility                   Additional Comments: pt from SNF, unable to provide PLOF and no answer to call to legal guardians    Prior Function Prior Level of Function : Patient poor historian/Family not available  Hand Dominance        Extremity/Trunk Assessment   Upper Extremity Assessment Upper Extremity Assessment: Generalized weakness    Lower Extremity Assessment Lower Extremity Assessment: Generalized weakness    Cervical / Trunk Assessment Cervical / Trunk Assessment: Kyphotic  Communication      Cognition Arousal/Alertness: Awake/alert Behavior During Therapy: Flat affect Overall Cognitive Status: No family/caregiver present to determine baseline cognitive functioning                                  General Comments: pt with verbalization, response to cues or commands        General Comments      Exercises     Assessment/Plan    PT Assessment Patient does not need any further PT services  PT Problem List         PT Treatment Interventions      PT Goals (Current goals can be found in the Care Plan section)  Acute Rehab PT Goals PT Goal Formulation: All assessment and education complete, DC therapy    Frequency       Co-evaluation               AM-PAC PT "6 Clicks" Mobility  Outcome Measure Help needed turning from your back to your side while in a flat bed without using bedrails?: Total Help needed moving from lying on your back to sitting on the side of a flat bed without using bedrails?: Total Help needed moving to and from a bed to a chair (including a wheelchair)?: Total Help needed standing up from a chair using your arms (e.g., wheelchair or bedside chair)?: Total Help needed to walk in hospital room?: Total Help needed climbing 3-5 steps with a railing? : Total 6 Click Score: 6    End of Session   Activity Tolerance: Patient tolerated treatment well Patient left: in bed;with call bell/phone within reach;with bed alarm set Nurse Communication: Mobility status;Need for lift equipment PT Visit Diagnosis: Other abnormalities of gait and mobility (R26.89)    Time: 1096-0454 PT Time Calculation (min) (ACUTE ONLY): 10 min   Charges:   PT Evaluation $PT Eval Low Complexity: 1 Low   PT General Charges $$ ACUTE PT VISIT: 1 Visit         Merryl Hacker, PT Acute Rehabilitation Services Office: [phone]   Brittany [name] 08/21/2022, 10:31 AM

## 2022-08-21 NOTE — Evaluation (Signed)
Clinical/Bedside Swallow Evaluation Patient Details  Name: Brittany [name] MRN: [id number] Date of Birth: 11-Apr-1960  Today's Date: 08/21/2022 Time: SLP Start Time (ACUTE ONLY): 0910 SLP Stop Time (ACUTE ONLY): 4098 SLP Time Calculation (min) (ACUTE ONLY): 17 min  Past Medical History:  Past Medical History:  Diagnosis Date   Abscess    Acute metabolic encephalopathy    C. 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   COPD, mild (HCC) 09/16/2015   PFTs on 06/15/15 with FEV1/FVC ratio of 64%, FEV1 83%, DLCO 47%   Diabetes mellitus without complication (HCC)    Dyslipidemia    History of Clostridium difficile colitis 03/03/2021   Hyperosmolar hyperglycemic state (HHS) (HCC) 11/18/2020   Hypertension    Hypokalemia; hyperkalemia 07/12/2019   Hyponatremia 08/22/2021   Lactic acidosis 11/19/2020   Lupus (HCC)    Pancreatic cancer (HCC)    RLS (restless legs syndrome)    Seizure disorder (HCC)    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 Diagnosed with C.diff 7/3 per facility. Intubated 7/5-7/8. Most recently seen by SLP May 2024 and recommended initiation of a regular diet with thin liquids only when fully alert. PMH includes dementia, lupus, COPD, CVA, AKI, GERD, seizure disorder, recent hospitalization for sepsis and PNA    Assessment / Plan / Recommendation  Clinical Impression  Pt remains quite lethargic today. Upon SLP arrival, pt's IV was found to be dislodged. RN notified. Pt  responded minimally to stimuli and simple or functional commands. Note her breathing had a gravelly quality throughout the session. When cued to produce verbal output, she did not do so. SLP performed oral care, during which pt responded by biting down on the swab and intermittently following commands to open her mouth. SLP presented one ice chip via teaspoon, which the pt accepted into her oral cavity and began masticating. She exhibited clinical signs of oral holding and required cueing to swallow. Immediately after swallowing, pt had a productive cough and required oral suctioning of thick mucous. Overall, she exhibits signs of post-intubation dysphagia. Recommend pt remain NPO as she is not maintaining a level of alertness appropriate for adequate PO intake. SLP will continue to f/u to assess vocal production and swallowing as able. SLP Visit Diagnosis: Dysphagia, unspecified (R13.10)    Aspiration Risk  Severe aspiration risk    Diet Recommendation NPO    Medication Administration: Via alternative means    Other  Recommendations Oral Care Recommendations: Oral care QID Caregiver Recommendations: Have oral suction available    Recommendations for follow up therapy are one component of a multi-disciplinary discharge planning process, led by the attending physician.  Recommendations may be updated based on patient status, additional functional criteria and insurance authorization.  Follow up Recommendations Skilled nursing-short term rehab (<3 hours/day)      Assistance Recommended at Discharge    Functional Status Assessment Patient has had a recent decline in their functional status and demonstrates the ability to make significant improvements  in function in a reasonable and predictable amount of time.  Frequency and Duration min 2x/week  2 weeks       Prognosis Prognosis for improved oropharyngeal function: Good Barriers to Reach Goals: Cognitive deficits      Swallow Study    General HPI: Pt is a 62 yo female presenting to ED from SNF 7/5 with AMS. Diagnosed with C.diff 7/3 per facility. Intubated 7/5-7/8. Most recently seen by SLP May 2024 and recommended initiation of a regular diet with thin liquids only when fully alert. PMH includes dementia, lupus, COPD, CVA, AKI, GERD, seizure disorder, recent hospitalization for sepsis and PNA Type of Study: Bedside Swallow Evaluation Previous Swallow Assessment: see HPI Diet Prior to this Study: NPO Temperature Spikes Noted: No Respiratory Status: Room air History of Recent Intubation: Yes Total duration of intubation (days): 3 days Date extubated: 08/20/22 Behavior/Cognition: Lethargic/Drowsy;Requires cueing Oral Cavity Assessment: Dry Oral Care Completed by SLP: Yes Oral Cavity - Dentition: Adequate natural dentition Vision: Functional for self-feeding Self-Feeding Abilities: Total assist Patient Positioning: Upright in bed Baseline Vocal Quality: Not observed Volitional Cough: Congested Volitional Swallow: Able to elicit    Oral/Motor/Sensory Function Overall Oral Motor/Sensory Function: Other (comment) (pt unable to follow commands to complete)   Ice Chips Ice chips: Impaired Presentation: Spoon Oral Phase Impairments: Poor awareness of bolus Oral Phase Functional Implications: Oral holding Pharyngeal Phase Impairments: Suspected delayed Swallow;Cough - Immediate   Thin Liquid      Nectar Thick     Honey Thick     Puree     Solid            Gwynneth Aliment, M.A., CF-SLP Speech Language Pathology, Acute Rehabilitation Services  Secure Chat preferred [phone]  08/21/2022,10:29 AM

## 2022-08-21 NOTE — Consult Note (Addendum)
WOC Nurse Consult Note: Reason for Consult: open areas sacrum  Wound type: 1.  Deep Tissue Pressure Injuries, 2.  Stage 2 Pressure Injury 3.  Skin lesions of unknown origin that are not open, not draining  Pressure Injury POA: Yes Measurement:  1. Deep Tissue Pressure Injury R upper buttock (medial to lateral in orientation) 1 cm x 6 cm deep purple maroon discoloration; Deep Tissue Pressure Injury R lower medial buttock 7 cm x 1 cm deep purple maroon discoloration  2.  Stage 2 Pressure Injury coccyx 1 cm x 2 cm x 0.1 cm 100% pink and moist  3.  Deep Tissue Pressure Injury R lateral ankle 2 cm x 1 cm 100% purple maroon discoloration  4.  Did notice scattered skin lesions of unknown etiology to bilateral lower buttocks and groin, have the appearance of suppurative hidradenitis however in review of notes there is no history of this. Patient does have history of lupus.  These lesions are not open or draining at this time, no warmth, no fluctuance.   No need for wound care at this time.    Drainage (amount, consistency, odor) minimal serosanguinous to Stage 2 PI, others dry  Periwound: intact  Dressing procedure/placement/frequency: Clean R buttock DTPI with soap and water, dry and place Xeroform gauze [place] [phone]) to area daily and cover with silicone foam. May lift foam daily to replace Xeroform. Change silicone foam q3 days and prn soiling.  Stage 2 PI coccyx clean with NS, cover with silicone foam to protect.  Lift foam daily to assess area. Change foam dressing q3 days and prn soiling.  R lateral ankle DTPI cover with Xeroform gauze [place] [phone]) and silicone foam. May lift foam daily to replace Xeroform.  Change foam dressing q3 days and prn soiling.   POC discussed with bedside nurse.  WOC team will not follow at this time.  Re-consult if further needs arise.   Thank you,     Priscella Mann MSN, RN-BC, [employer] [phone]

## 2022-08-21 NOTE — Progress Notes (Signed)
Nutrition Follow-up  DOCUMENTATION CODES:   Severe malnutrition in context of chronic illness  INTERVENTION:   Tube feeding via Cortrak: - Change to Osmolite 1.2 @ 55 ml/hr (1320 ml/day)  Tube feeding regimen provides 1584 kcal, 73 grams of protein, and 1082 ml of H2O.  - MVI with minerals daily per tube  NUTRITION DIAGNOSIS:   Severe Malnutrition related to chronic illness (dementia, CHF, COPD, pancreatic mass, dysphagia) as evidenced by severe fat depletion, severe muscle depletion.  New diagnosis after completion of NFPE  GOAL:   Patient will meet greater than or equal to 90% of their needs  Met via TF  MONITOR:   Diet advancement, Labs, Weight trends, TF tolerance, Skin, I & O's  REASON FOR ASSESSMENT:   Consult Enteral/tube feeding initiation and management  ASSESSMENT:   62 y.o. female with Pmhx including chronic HFpEF, diabetes, dementia, COPD, lupus, orthostatic hypotension, AKI, pancreatic mass, depression, dysphagia, GERD, seizure disorder, cdiff, colostomy who was transferred from SNF with acute respiratory distress 2/2 recurrent PNA and sepsis  07/08 - extubated, Cortrak placed (tip distal stomach)  Discussed pt with RN and during ICU rounds. Pt seen by SLP today who is recommending pt remain NPO as she is not maintaining a level of alertness appropriate for adequate PO intake.  Per review of notes, pt has experienced a functional decline since May 2024 and has since been bedbound.  RD assessed pt at bedside. Pt sleeping at time of RD visit and did not awaken to RD voice or touch. Based on NFPE, pt meets criteria for severe malnutrition. Will order MVI with minerals daily and adjust tube feeding regimen to better meet pt's needs.  Admit weight: 50 kg Current weight: 56.4 kg  Current TF: Vital AF 1.2 @ 50 ml/hr, free water flushes of 100 ml q 4 hours  Medications reviewed and include: SSI, semglee 12 units daily, IV abx, IV sodium phosphate 30 mmol x  1  Labs reviewed: sodium 146, chloride 115, BUN 36, phosphorus 2.2, WBC 17.6, hemoglobin 8.6 CBG's: 108-190 x 24 hours  UOP: 1000 ml x 24 hours Colostomy: 250 ml x 24 hours I/O's: +6.1 L since admit  NUTRITION - FOCUSED PHYSICAL EXAM:  Flowsheet Row Most Recent Value  Orbital Region Moderate depletion  Upper Arm Region Mild depletion  Thoracic and Lumbar Region Severe depletion  Buccal Region Severe depletion  Temple Region Severe depletion  Clavicle Bone Region Moderate depletion  Clavicle and Acromion Bone Region Severe depletion  Scapular Bone Region Moderate depletion  Dorsal Hand Moderate depletion  Patellar Region Severe depletion  Anterior Thigh Region Severe depletion  Posterior Calf Region Severe depletion  Edema (RD Assessment) Mild  Hair Reviewed  Eyes Reviewed  Mouth Reviewed  Skin Reviewed  Nails Reviewed    Diet Order:   Diet Order             Diet NPO time specified  Diet effective now                   EDUCATION NEEDS:   Not appropriate for education at this time  Skin:  Skin Assessment (per WOC note): Skin Integrity Issues: DTI: R buttock, R lateral ankle Stage II: coccyx Other: scattered skin lesions of unknown etiology to bilateral lower buttocks and groin  Last BM:  08/21/22 colostomy (250 ml x 24 hours)  Height:   Ht Readings from Last 1 Encounters:  08/18/22 5' (1.524 m)    Weight:   Wt Readings from  Last 1 Encounters:  08/21/22 56.4 kg    BMI:  Body mass index is 24.28 kg/m.  Estimated Nutritional Needs:   Kcal:  1550-1750  Protein:  70-80 grams  Fluid:  1.6-1.8 L    Mertie Clause, MS, RD, LDN Inpatient Clinical Dietitian Please see AMiON for contact information.

## 2022-08-21 NOTE — Progress Notes (Signed)
NAME:  Brittany [name], MRN:  [id number], DOB:  24-Nov-1960, LOS: 4 ADMISSION DATE:  08/17/2022, CONSULTATION DATE:  08/17/22  REFERRING MD:  Particia Nearing , CHIEF COMPLAINT:  pneumonia, sepsis, respiratory failure    History of Present Illness:  Brittany [name] is a 62 y.o. admitted to the ICU for acute respiratory failure due to HCAP vs aspiration pneumonia requiring intubation. Patient was in septic shock which is now resolved with fluid resuscitation and antibiotic treatment. Patient is now off of sedation and extubated.   Pertinent  Medical History  Lupus, Dementia (as of early May 2024 ws ambulatory and able to walk and feed self at time of last dc May 30 required max assist to stand w/ rolling walker), prior stroke, HFpEF, COPD, diabetes, orthostatic hypotension, colostomy present, AKI, prior admit for HHNK, Pancreatic mass, depression, dysphagia, HL, anxiety and depression, GERD, intraductal papillary mucinous neoplasm, seizure disorder, cdiff (on oral vanc), recent hospitalization for sepsis and PNA.    Legal guardian from DSS Sent[place] Laroy Apple [phone]  Significant Hospital Events: Including procedures, antibiotic start and stop dates in addition to other pertinent events   7/5 admitted w/ PNA/sepsis and respiratory failure. Intubated. Cultures sent. Given Ceftriaxone and azith in ER>changed to HCAP coverage cefepime and vanc by PCCM team. Received her 30 ml/kg in ER  7/6 significant lactic acidosis with lactic acid greater than 9 and coupled with hypotension overnight, received additional IV hydration 7/7 Hypothermia with decreased urine output, CT Head negative 7/8 Extubated  Interim History / Subjective:  NAEO. Had one episode of hypoglycemia yesterday after tube feeds were stop briefly with extubation. Overnight ICU doc ordered VBG for bradypnea.   Objective   Blood pressure 122/79, pulse 87, temperature 98.9 F (37.2 C), temperature source Axillary, resp. rate 10, height 5'  (1.524 m), weight 56.4 kg, SpO2 100 %.    Vent Mode: PRVC FiO2 (%):  [40 %] 40 % Set Rate:  [15 bmp] 15 bmp Vt Set:  [400 mL] 400 mL PEEP:  [5 cmH20] 5 cmH20 Plateau Pressure:  [12 cmH20] 12 cmH20   Intake/Output Summary (Last 24 hours) at 08/21/2022 0744 Last data filed at 08/21/2022 0400 Gross per 24 hour  Intake 737.58 ml  Output 1250 ml  Net -512.42 ml   Filed Weights   08/19/22 0500 08/20/22 0500 08/21/22 0445  Weight: 55.4 kg 56.2 kg 56.4 kg    Examination: General: chronically ill appearing, appears older than stated age HENT: moist mucous membranes Lungs: CTAB, normal work of breathing on 3L [place], RR 12 Cardiovascular: regular rate and rhythm, no murmurs appreciated Abdomen: normal bowel sounds, soft, non-tender to palpation Extremities: warm well perfused, pulses 2+, no peripheral edema Neuro: awakens to verbal stimuli, following commands  Resolved Hospital Problem list   Septic shock  Assessment & Plan:  Acute hypoxic respiratory failure 2/2 recurrent pneumonia HCAP vs aspiration - Improving, likely HCAP (respiratory culture growing Klebsiella)  -S/p extubation -Continue Cefepime (07/05 - ) -Wean oxygen support as needed -Suction as needed  Acute metabolic encephalopathy secondary to sepsis with underlying end stage dementia - Improving, able to follow commands today, suspect this is likely patient's baseline after admission in May. -Nutrition and bowel regimen -Home Depakote and Seroquel -Aspiration precautions  Physical deconditioning and protein calorie malnutrition -PT/OT when able -Supportive care -Continue tube feeds -Coretrak placed  C. Diff -Continue Oral Vancomycin -Monitor I/Os  Type 2 Diabetes - mildly elevated -Increase LAI to 12u -Continue tube feed coverage 3u -Moderate SSI -  CBGs q4h  Orthostatic Hypotension HFpEF - Euvolemic on exam -Continue home Florinef -Telemetry -Strict I/O  Best Practice (right click and "Reselect all  SmartList Selections" daily)   Diet/type: tubefeeds DVT prophylaxis: prophylactic heparin  GI prophylaxis: N/A Lines: Central line Foley:  Yes, discontinue Code Status:  full code Last date of multidisciplinary goals of care discussion [07/09, will fax DNR recommendation to DSS Legal guardian]  Labs   CBC: Recent Labs  Lab 08/17/22 1454 08/17/22 1536 08/18/22 0130 08/18/22 0530 08/19/22 0352 08/20/22 0413 08/21/22 0452  WBC 16.0*  --  18.9*  --  24.1* 17.3* 17.6*  NEUTROABS 11.0*  --   --   --   --   --   --   HGB 11.1*   < > 8.2* 9.2* 10.0* 8.4* 8.6*  HCT 35.1*   < > 25.7* 27.0* 31.1* 26.1* 26.8*  MCV 96.4  --  98.5  --  95.1 95.3 94.4  PLT 254  --  213  --  277 231 240   < > = values in this interval not displayed.    Basic Metabolic Panel: Recent Labs  Lab 08/17/22 1920 08/17/22 2120 08/18/22 0130 08/18/22 0530 08/18/22 1155 08/19/22 0352 08/19/22 1700 08/20/22 0413 08/20/22 1659 08/21/22 0452  NA 143   < > 138 143  --  140  --  147*  --  146*  K 5.1   < > 4.8 4.2  --  4.1  --  3.4*  --  3.9  CL 104  --  109  --   --  111  --  113*  --  115*  CO2 16*  --  18*  --   --  23  --  25  --  26  GLUCOSE 212*  --  302*  --   --  372*  --  234*  --  228*  BUN 35*  --  33*  --   --  45*  --  44*  --  36*  CREATININE 1.52*  --  1.15*  --   --  1.08*  --  0.82  --  0.76  CALCIUM 7.4*  --  7.8*  --   --  8.3*  --  8.9  --  8.7*  MG  --   --  1.4*  --    < > 2.8* 2.6* 2.6* 2.2 2.1  PHOS  --   --  3.6  --    < > 3.0 2.2* 2.1* 2.4* 2.2*   < > = values in this interval not displayed.   GFR: Estimated Creatinine Clearance: 57.4 mL/min (by C-G formula based on SCr of 0.76 mg/dL). Recent Labs  Lab 08/17/22 1848 08/17/22 1920 08/17/22 2212 08/18/22 0130 08/18/22 0553 08/18/22 1315 08/19/22 0352 08/20/22 0413 08/21/22 0452  PROCALCITON  --  2.04  --  3.36  --   --  4.83  --   --   WBC  --   --   --  18.9*  --   --  24.1* 17.3* 17.6*  LATICACIDVEN >9.0*  --  5.4*  --   2.4* 1.7  --   --   --     Liver Function Tests: Recent Labs  Lab 08/17/22 1920  AST 31  ALT 13  ALKPHOS 74  BILITOT 0.6  PROT 4.9*  ALBUMIN <1.5*   No results for input(s): "LIPASE", "AMYLASE" in the last 168 hours. No results for input(s): "AMMONIA" in the last  168 hours.  ABG    Component Value Date/Time   PHART 7.409 08/18/2022 0530   PCO2ART 38.1 08/18/2022 0530   PO2ART 92 08/18/2022 0530   HCO3 28.3 (H) 08/21/2022 0458   TCO2 25 08/18/2022 0530   ACIDBASEDEF 5.0 (H) 08/17/2022 2120   O2SAT 73.5 08/21/2022 0458     Coagulation Profile: Recent Labs  Lab 08/17/22 1454  INR 1.6*    Cardiac Enzymes: No results for input(s): "CKTOTAL", "CKMB", "CKMBINDEX", "TROPONINI" in the last 168 hours.  HbA1C: Hgb A1c MFr Bld  Date/Time Value Ref Range Status  07/05/2022 11:34 PM 10.9 (H) 4.8 - 5.6 % Final    Comment:    (NOTE)         Prediabetes: 5.7 - 6.4         Diabetes: >6.4         Glycemic control for adults with diabetes: <7.0   02/14/2022 09:22 AM 11.0 (H) 4.8 - 5.6 % Final    Comment:    (NOTE)         Prediabetes: 5.7 - 6.4         Diabetes: >6.4         Glycemic control for adults with diabetes: <7.0     CBG: Recent Labs  Lab 08/20/22 1544 08/20/22 1918 08/20/22 2324 08/21/22 0319 08/21/22 0730  GLUCAP 167* 186* 108* 153* 190*

## 2022-08-22 DIAGNOSIS — A0472 Enterocolitis due to Clostridium difficile, not specified as recurrent: Secondary | ICD-10-CM | POA: Diagnosis not present

## 2022-08-22 DIAGNOSIS — J150 Pneumonia due to Klebsiella pneumoniae: Secondary | ICD-10-CM | POA: Diagnosis not present

## 2022-08-22 DIAGNOSIS — J9601 Acute respiratory failure with hypoxia: Secondary | ICD-10-CM | POA: Diagnosis not present

## 2022-08-22 DIAGNOSIS — E43 Unspecified severe protein-calorie malnutrition: Secondary | ICD-10-CM | POA: Insufficient documentation

## 2022-08-22 LAB — CBC
HCT: 25.3 % — ABNORMAL LOW (ref 36.0–46.0)
Hemoglobin: 8.3 g/dL — ABNORMAL LOW (ref 12.0–15.0)
MCH: 30.5 pg (ref 26.0–34.0)
MCHC: 32.8 g/dL (ref 30.0–36.0)
MCV: 93 fL (ref 80.0–100.0)
Platelets: 230 10*3/uL (ref 150–400)
RBC: 2.72 MIL/uL — ABNORMAL LOW (ref 3.87–5.11)
RDW: 24.4 % — ABNORMAL HIGH (ref 11.5–15.5)
WBC: 15.1 10*3/uL — ABNORMAL HIGH (ref 4.0–10.5)
nRBC: 2.7 % — ABNORMAL HIGH (ref 0.0–0.2)

## 2022-08-22 LAB — BASIC METABOLIC PANEL
Anion gap: 10 (ref 5–15)
BUN: 30 mg/dL — ABNORMAL HIGH (ref 8–23)
CO2: 26 mmol/L (ref 22–32)
Calcium: 8.4 mg/dL — ABNORMAL LOW (ref 8.9–10.3)
Chloride: 115 mmol/L — ABNORMAL HIGH (ref 98–111)
Creatinine, Ser: 0.68 mg/dL (ref 0.44–1.00)
GFR, Estimated: >60 mL/min (ref >60)
Glucose, Bld: 150 mg/dL — ABNORMAL HIGH (ref 70–99)
Potassium: 3.4 mmol/L — ABNORMAL LOW (ref 3.5–5.1)
Sodium: 151 mmol/L — ABNORMAL HIGH (ref 135–145)

## 2022-08-22 LAB — GLUCOSE, CAPILLARY
Glucose-Capillary: 169 mg/dL — ABNORMAL HIGH (ref 70–99)
Glucose-Capillary: 99 mg/dL (ref 70–99)
Glucose-Capillary: 105 mg/dL — ABNORMAL HIGH (ref 70–99)
Glucose-Capillary: 120 mg/dL — ABNORMAL HIGH (ref 70–99)
Glucose-Capillary: 149 mg/dL — ABNORMAL HIGH (ref 70–99)
Glucose-Capillary: 120 mg/dL — ABNORMAL HIGH (ref 70–99)

## 2022-08-22 LAB — MAGNESIUM: Magnesium: 1.8 mg/dL (ref 1.7–2.4)

## 2022-08-22 LAB — PHOSPHORUS: Phosphorus: 2.1 mg/dL — ABNORMAL LOW (ref 2.5–4.6)

## 2022-08-22 MED ORDER — POTASSIUM CHLORIDE 20 MEQ PO PACK
40.0000 meq | PACK | Freq: Once | ORAL | Status: AC
  Administered 2022-08-22: 40 meq
  Filled 2022-08-22: qty 2

## 2022-08-22 MED ORDER — MAGNESIUM SULFATE 2 GM/50ML IV SOLN: 2.0000 g | Freq: Once | INTRAVENOUS | Status: DC

## 2022-08-22 MED ORDER — POTASSIUM & SODIUM PHOSPHATES 280-160-250 MG PO PACK
2.0000 | PACK | Freq: Three times a day (TID) | ORAL | Status: AC
  Administered 2022-08-22 (×2): 2
  Filled 2022-08-22 (×2): qty 2

## 2022-08-22 MED ORDER — FUROSEMIDE 10 MG/ML IJ SOLN
60.0000 mg | Freq: Three times a day (TID) | INTRAMUSCULAR | Status: AC
  Administered 2022-08-22 (×3): 60 mg via INTRAVENOUS
  Filled 2022-08-22 (×3): qty 6

## 2022-08-22 MED ORDER — MAGNESIUM SULFATE 2 GM/50ML IV SOLN
2.0000 g | Freq: Once | INTRAVENOUS | Status: AC
  Administered 2022-08-22: 2 g via INTRAVENOUS
  Filled 2022-08-22: qty 50

## 2022-08-22 MED ORDER — FREE WATER
150.0000 mL | Status: DC
  Administered 2022-08-22 – 2022-08-23 (×13): 150 mL

## 2022-08-22 MED ORDER — SODIUM PHOSPHATES 45 MMOLE/15ML IV SOLN
45.0000 mmol | Freq: Once | INTRAVENOUS | Status: AC
  Administered 2022-08-22: 45 mmol via INTRAVENOUS
  Filled 2022-08-22: qty 15

## 2022-08-22 MED ORDER — POTASSIUM CHLORIDE 10 MEQ/50ML IV SOLN
10.0000 meq | INTRAVENOUS | Status: AC
  Administered 2022-08-22 (×4): 10 meq via INTRAVENOUS
  Filled 2022-08-22 (×4): qty 50

## 2022-08-22 MED ORDER — INSULIN ASPART 100 UNIT/ML IJ SOLN
4.0000 [IU] | INTRAMUSCULAR | Status: DC
  Administered 2022-08-22 – 2022-08-23 (×5): 4 [IU] via SUBCUTANEOUS

## 2022-08-22 MED ORDER — INSULIN ASPART 100 UNIT/ML IJ SOLN
0.0000 [IU] | INTRAMUSCULAR | Status: DC
  Administered 2022-08-22: 2 [IU] via SUBCUTANEOUS
  Administered 2022-08-23: 3 [IU] via SUBCUTANEOUS
  Administered 2022-08-23: 2 [IU] via SUBCUTANEOUS
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
## 2022-08-22 NOTE — Progress Notes (Signed)
NAME:  Brittany [name], MRN:  [id number], DOB:  05-04-1960, LOS: 5 ADMISSION DATE:  08/17/2022, CONSULTATION DATE:  08/17/22  REFERRING MD:  Particia Nearing, CHIEF COMPLAINT:  pneumonia, sepsis, respiratory failure    History of Present Illness:  Brittany [name] is a 62 y.o. admitted to the ICU for acute respiratory failure due to HCAP vs aspiration pneumonia requiring intubation. Patient was in septic shock which is now resolved with fluid resuscitation and antibiotic treatment. Patient is now off of sedation and extubated.   Pertinent  Medical History  Lupus, Dementia (as of early May 2024 ws ambulatory and able to walk and feed self at time of last dc May 30 required max assist to stand w/ rolling walker), prior stroke, HFpEF, COPD, diabetes, orthostatic hypotension, colostomy present, AKI, prior admit for HHNK, Pancreatic mass, depression, dysphagia, HL, anxiety and depression, GERD, intraductal papillary mucinous neoplasm, seizure disorder, cdiff (on oral vanc), recent hospitalization for sepsis and PNA.    Legal guardian from DSS Brittany [name] [phone]  Significant Hospital Events: Including procedures, antibiotic start and stop dates in addition to other pertinent events   7/5 admitted w/ PNA/sepsis and respiratory failure. Intubated. Cultures sent. Given Ceftriaxone and azith in ER>changed to HCAP coverage cefepime and vanc by PCCM team. Received her 30 ml/kg in ER  7/6 significant lactic acidosis with lactic acid greater than 9 and coupled with hypotension overnight, received additional IV hydration 7/7 Hypothermia with decreased urine output, CT Head negative 7/8 Extubated  Interim History / Subjective:  NAEO. IV team unable to place additional PIV yesterday due to edema, and central line remained in. Foley removed.   Objective   Blood pressure 109/68, pulse (!) 101, temperature 99.2 F (37.3 C), temperature source Axillary, resp. rate 17, height 5' (1.524 m), weight 55.4 kg, SpO2  99 %.        Intake/Output Summary (Last 24 hours) at 08/22/2022 0858 Last data filed at 08/22/2022 0400 Gross per 24 hour  Intake 654.89 ml  Output 1375 ml  Net -720.11 ml   Filed Weights   08/20/22 0500 08/21/22 0445 08/22/22 0413  Weight: 56.2 kg 56.4 kg 55.4 kg    Examination: General: chronically ill appearing HENT: moist mucous membranes Lungs: referred upper airway noises, some rhonchi, normal WOB on RA Cardiovascular: regular rate and rhythm, no murmurs appreciated Abdomen: soft, non-tender to palpation Extremities: bilateral upper upper and lower extremities 1+ edema, warm well perfused Neuro: awakes to verbal stimuli, does not follow commands  Resolved Hospital Problem list   Shock  Assessment & Plan:  Acute hypoxic respiratory failure 2/2 recurrent pneumonia HCAP vs aspiration - Improving, stable now on room air. -S/p extubation -Deescalated to Ceftriaxone yesterday (total antibiotic course 07/05 - ) -Wean oxygen support as needed -Suction as needed  Acute metabolic encephalopathy secondary to sepsis with underlying end stage dementia - Stable, however, no longer improving. This may likely represent patient's new baseline. -Nutrition and bowel regimen -Home Depakote and Seroquel -Aspiration precautions -Delirium precautions  Physical deconditioning and protein calorie malnutrition -PT/OT when able -Supportive care -Continue tube feeds -Coretrak placed  C. Diff -Continue Oral Vancomycin, completes course on 08/27/22 -Monitor I/Os  Type 2 Diabetes - mildly elevated -Semglee 12u -tube feed coverage 4u -Moderate SSI -CBGs q4h  Orthostatic Hypotension HFpEF - Volume overloaded on exam today.  -Hold home Florinef, given patient's deconditioning and malnutrition, it is unlikely patient will regain meaningful functionality where ortho-stasis will affect patient -Lasix 60mg  IV q8h -Telemetry -Strict  I/O  Hypernatremia -Increase free water to  q2h  Best Practice (right click and "Reselect all SmartList Selections" daily)   Diet/type: tubefeeds DVT prophylaxis: prophylactic heparin  GI prophylaxis: N/A Lines: Central line Foley:  Yes, discontinue Code Status:  full code Last date of multidisciplinary goals of care discussion [see my other progress note from today]  Labs   CBC: Recent Labs  Lab 08/17/22 1454 08/17/22 1536 08/18/22 0130 08/18/22 0530 08/19/22 0352 08/20/22 0413 08/21/22 0452 08/22/22 0502  WBC 16.0*  --  18.9*  --  24.1* 17.3* 17.6* 15.1*  NEUTROABS 11.0*  --   --   --   --   --   --   --   HGB 11.1*   < > 8.2* 9.2* 10.0* 8.4* 8.6* 8.3*  HCT 35.1*   < > 25.7* 27.0* 31.1* 26.1* 26.8* 25.3*  MCV 96.4  --  98.5  --  95.1 95.3 94.4 93.0  PLT 254  --  213  --  277 231 240 230   < > = values in this interval not displayed.    Basic Metabolic Panel: Recent Labs  Lab 08/18/22 0130 08/18/22 0530 08/18/22 1155 08/19/22 0352 08/19/22 1700 08/20/22 0413 08/20/22 1659 08/21/22 0452 08/22/22 0502  NA 138 143  --  140  --  147*  --  146* 151*  K 4.8 4.2  --  4.1  --  3.4*  --  3.9 3.4*  CL 109  --   --  111  --  113*  --  115* 115*  CO2 18*  --   --  23  --  25  --  26 26  GLUCOSE 302*  --   --  372*  --  234*  --  228* 150*  BUN 33*  --   --  45*  --  44*  --  36* 30*  CREATININE 1.15*  --   --  1.08*  --  0.82  --  0.76 0.68  CALCIUM 7.8*  --   --  8.3*  --  8.9  --  8.7* 8.4*  MG 1.4*  --    < > 2.8* 2.6* 2.6* 2.2 2.1 1.8  PHOS 3.6  --    < > 3.0 2.2* 2.1* 2.4* 2.2* 2.1*   < > = values in this interval not displayed.   GFR: Estimated Creatinine Clearance: 57 mL/min (by C-G formula based on SCr of 0.68 mg/dL). Recent Labs  Lab 08/17/22 1848 08/17/22 1920 08/17/22 2212 08/18/22 0130 08/18/22 0553 08/18/22 1315 08/19/22 0352 08/20/22 0413 08/21/22 0452 08/22/22 0502  PROCALCITON  --  2.04  --  3.36  --   --  4.83  --   --   --   WBC  --   --   --  18.9*  --   --  24.1* 17.3* 17.6*  15.1*  LATICACIDVEN >9.0*  --  5.4*  --  2.4* 1.7  --   --   --   --     Liver Function Tests: Recent Labs  Lab 08/17/22 1920  AST 31  ALT 13  ALKPHOS 74  BILITOT 0.6  PROT 4.9*  ALBUMIN <1.5*   No results for input(s): "LIPASE", "AMYLASE" in the last 168 hours. No results for input(s): "AMMONIA" in the last 168 hours.  ABG    Component Value Date/Time   PHART 7.409 08/18/2022 0530   PCO2ART 38.1 08/18/2022 0530   PO2ART 92 08/18/2022 0530  HCO3 28.3 (H) 08/21/2022 0458   TCO2 25 08/18/2022 0530   ACIDBASEDEF 5.0 (H) 08/17/2022 2120   O2SAT 73.5 08/21/2022 0458     Coagulation Profile: Recent Labs  Lab 08/17/22 1454  INR 1.6*    Cardiac Enzymes: No results for input(s): "CKTOTAL", "CKMB", "CKMBINDEX", "TROPONINI" in the last 168 hours.  HbA1C: Hgb A1c MFr Bld  Date/Time Value Ref Range Status  07/05/2022 11:34 PM 10.9 (H) 4.8 - 5.6 % Final    Comment:    (NOTE)         Prediabetes: 5.7 - 6.4         Diabetes: >6.4         Glycemic control for adults with diabetes: <7.0   02/14/2022 09:22 AM 11.0 (H) 4.8 - 5.6 % Final    Comment:    (NOTE)         Prediabetes: 5.7 - 6.4         Diabetes: >6.4         Glycemic control for adults with diabetes: <7.0     CBG: Recent Labs  Lab 08/21/22 1524 08/21/22 1935 08/21/22 2357 08/22/22 0326 08/22/22 0723  GLUCAP 82 144* 230* 169* 99

## 2022-08-22 NOTE — Progress Notes (Signed)
PT Cancellation Note  Patient Details Name: Brittany [name] MRN: [id number] DOB: 02-19-1960   Cancelled Treatment:    Reason Eval/Treat Not Completed: PT screened, no needs identified, will sign off. Pt was evaluated yesterday by PT - please see that note. OT saw today and reports no change in ability to participate with therapy. Pt at LTC facility.    Angelina Ok [facility] 08/22/2022, 12:34 PM Skip Mayer PT Acute [employer] [phone]

## 2022-08-22 NOTE — Evaluation (Signed)
Occupational Therapy Evaluation Patient Details Name: Brittany [name] MRN: [id number] DOB: 20-Dec-1960 Today's Date: 08/22/2022   History of Present Illness 62 yo female admitted 7/5 from LTC SNF [facility]) with respiratory distress requiring intubation. Extubated 7/8. PMHx: seizure disorder, colectomy with colostomy, depression, dementia, anxiety, T2DM, HTN, HLD, CVA, insomnia, Lupus   Clinical Impression   Upon eval, pt lethargic with no attempts to verbalize or follow commands. Pt requiring total A for bed mobility and sitting balance trial. Limited spontaneous safety response to brace self EOB. Pt with UE tightness and edema noted in LUE; provided PROM and repositioned; RN aware. Attempted to call legal guardians but no answer. Anticipate pt requiring significant assist at baseline and without current ability to engage in therapy. Will sign off for now, please re-order if pt with change in status/cognition.      Recommendations for follow up therapy are one component of a multi-disciplinary discharge planning process, led by the attending physician.  Recommendations may be updated based on patient status, additional functional criteria and insurance authorization.   Assistance Recommended at Discharge Frequent or constant Supervision/Assistance  Patient can return home with the following Two people to help with walking and/or transfers;Two people to help with bathing/dressing/bathroom;Assistance with feeding;Assistance with cooking/housework;Direct supervision/assist for medications management;Direct supervision/assist for financial management;Assist for transportation;Help with stairs or ramp for entrance    Functional Status Assessment  Patient has had a recent decline in their functional status and/or demonstrates limited ability to make significant improvements in function in a reasonable and predictable amount of time  Equipment Recommendations  None recommended by OT     Recommendations for Other Services       Precautions / Restrictions Precautions Precaution Comments: cortrak Restrictions Weight Bearing Restrictions: No      Mobility Bed Mobility Overal bed mobility: Needs Assistance Bed Mobility: Supine to Sit, Sit to Supine     Supine to sit: Total assist Sit to supine: Total assist   General bed mobility comments: pt currently total assist to transition from supine<>sit with  total A for sitting balance EOB. Pt with automatic movement of UB but without significant effort to assist with mobility or balance. No change in verbalization or arousal with sitting    Transfers                   General transfer comment: deferred      Balance Overall balance assessment: Needs assistance   Sitting balance-Leahy Scale: Poor                                     ADL either performed or assessed with clinical judgement   ADL Overall ADL's : Needs assistance/impaired Eating/Feeding: NPO   Grooming: Total assistance   Upper Body Bathing: Total assistance   Lower Body Bathing: Total assistance;+2 for physical assistance;+2 for safety/equipment   Upper Body Dressing : Total assistance   Lower Body Dressing: Total assistance;+2 for physical assistance;+2 for safety/equipment   Toilet Transfer: Total assistance;+2 for physical assistance;+2 for safety/equipment   Toileting- Clothing Manipulation and Hygiene: Total assistance;+2 for physical assistance;+2 for safety/equipment   Tub/ Shower Transfer: Total assistance;+2 for physical assistance;+2 for safety/equipment   Functional mobility during ADLs: Total assistance       Vision   Additional Comments: Pt unable to report. Not tracking with OT manually opening eyes.     Perception     Praxis  Pertinent Vitals/Pain Pain Assessment Pain Assessment: PAINAD Facial Expression: Relaxed, neutral Body Movements: Absence of movements Muscle Tension:  Relaxed Compliance with ventilator (intubated pts.): N/A Vocalization (extubated pts.): Talking in normal tone or no sound CPOT Total: 0 Pain Intervention(s): Monitored during session     Hand Dominance     Extremity/Trunk Assessment Upper Extremity Assessment Upper Extremity Assessment: Generalized weakness;LUE deficits/detail LUE Deficits / Details: edema in hand; provided PROM to hand and repositioned in elevated position   Lower Extremity Assessment Lower Extremity Assessment: Generalized weakness   Cervical / Trunk Assessment Cervical / Trunk Assessment: Kyphotic   Communication     Cognition Arousal/Alertness: Awake/alert Behavior During Therapy: Flat affect Overall Cognitive Status: No family/caregiver present to determine baseline cognitive functioning                                 General Comments: no verbalization or response to cues or commands. Limited protective response.     General Comments  VSS    Exercises Exercises: Other exercises Other Exercises Other Exercises: PROM to BUE. Thightness in shoulders.   Shoulder Instructions      Home Living Family/patient expects to be discharged to:: Skilled nursing facility                                 Additional Comments: pt from SNF, unable to provide PLOF and no answer to call to legal guardians      Prior Functioning/Environment Prior Level of Function : Patient poor historian/Family not available                        OT Problem List: Decreased strength      OT Treatment/Interventions:      OT Goals(Current goals can be found in the care plan section) Acute Rehab OT Goals Patient Stated Goal: unable OT Goal Formulation: Patient unable to participate in goal setting  OT Frequency:      Co-evaluation              AM-PAC OT "6 Clicks" Daily Activity     Outcome Measure Help from another person eating meals?: Total Help from another person taking  care of personal grooming?: Total Help from another person toileting, which includes using toliet, bedpan, or urinal?: Total Help from another person bathing (including washing, rinsing, drying)?: Total Help from another person to put on and taking off regular upper body clothing?: Total Help from another person to put on and taking off regular lower body clothing?: Total 6 Click Score: 6   End of Session Nurse Communication: Mobility status (no response to commands)  Activity Tolerance: Patient limited by lethargy Patient left: in bed;with call bell/phone within reach;with bed alarm set  OT Visit Diagnosis: Unsteadiness on feet (R26.81);Muscle weakness (generalized) (M62.81);Other symptoms and signs involving cognitive function                Time: 1610-9604 OT Time Calculation (min): 21 min Charges:  OT General Charges $OT Visit: 1 Visit OT Evaluation $OT Eval Moderate Complexity: 1 Mod  Tyler Deis, OTR/L [facility] Acute Rehabilitation Office: [phone]   Myrla Halsted 08/22/2022, 1:47 PM

## 2022-08-22 NOTE — Progress Notes (Signed)
Called patient's DSS legal guardian [name].  She reports that they received faxed yesterday regarding recommendation for DNR status.  She has submitted to her DSS superior who approves those recommendations.  She plans to call back to the floor when she has an update on whether or not it is approved.  Will change CODE STATUS at that time if approved.  Informed that ICU team is planning to fill out MOST form for patient so she remains DNR at discharge.   Provider also informed her that floor received several calls from family members.  Per DSS instructions, floor nurses have told them to call DSS for updates.  Provided medical updates for patient.  Explained likely she will be able to leave the ICU soon, but long-term prognosis is poor. DSS will provide updates to family.

## 2022-08-22 NOTE — Progress Notes (Signed)
I spoke to [facility] with [place] DSS  [phone]) regarding Ms. Vieau's care. She agrees that DNR is appropriate and that pursuing hospice care when she discharges to SNF after this hospitalization. We discussed completing a MOST form, which she agrees with, and since she is terminal, it is reasonable to try to avoid future hospitalizations under hospice care.   DNR order now in epic. [place] durable DNR form signed and placed in chart.  Steffanie Dunn, DO 08/22/22 2:35 PM [facility]  For contact information, see Amion. If no response to pager, please call PCCM consult pager. After hours, 7PM- 7AM, please call Elink.

## 2022-08-23 LAB — BASIC METABOLIC PANEL
Anion gap: 12 (ref 5–15)
BUN: 27 mg/dL — ABNORMAL HIGH (ref 8–23)
CO2: 32 mmol/L (ref 22–32)
Calcium: 8.1 mg/dL — ABNORMAL LOW (ref 8.9–10.3)
Chloride: 100 mmol/L (ref 98–111)
Creatinine, Ser: 0.76 mg/dL (ref 0.44–1.00)
GFR, Estimated: >60 mL/min (ref >60)
Glucose, Bld: 255 mg/dL — ABNORMAL HIGH (ref 70–99)
Potassium: 4.7 mmol/L (ref 3.5–5.1)
Sodium: 144 mmol/L (ref 135–145)

## 2022-08-23 LAB — PHOSPHORUS: Phosphorus: 3.3 mg/dL (ref 2.5–4.6)

## 2022-08-23 LAB — MAGNESIUM: Magnesium: 1.8 mg/dL (ref 1.7–2.4)

## 2022-08-23 LAB — GLUCOSE, CAPILLARY
Glucose-Capillary: 144 mg/dL — ABNORMAL HIGH (ref 70–99)
Glucose-Capillary: 178 mg/dL — ABNORMAL HIGH (ref 70–99)

## 2022-08-23 MED ORDER — DIVALPROEX SODIUM 500 MG PO DR TAB: 500.0000 mg | DELAYED_RELEASE_TABLET | Freq: Three times a day (TID) | ORAL | Status: AC

## 2022-08-23 MED ORDER — FREE WATER: 150.0000 mL | Status: AC

## 2022-08-23 MED ORDER — METFORMIN HCL 500 MG PO TABS: 500.0000 mg | ORAL_TABLET | Freq: Two times a day (BID) | ORAL | Status: AC
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
## 2022-08-23 NOTE — Plan of Care (Signed)

## 2022-08-23 NOTE — Progress Notes (Signed)
Speech Language Pathology Treatment: Dysphagia  Patient Details Name: Brittany [name] MRN: [id number] DOB: April 01, 1960 Today's Date: 08/23/2022 Time: 1191-4782 SLP Time Calculation (min) (ACUTE ONLY): 15 min  Assessment / Plan / Recommendation Clinical Impression  Given decision to transition to hospice care when d/c to SNF, pt seen today to assess ability to take PO meds. Pt is increasingly lethargic and did not respond to noxious stimuli. SLP presented pt with an ice chip and showed no awareness. SLP then presented a small bite of applesauce, which had to be suctioned from her oral cavity. At this time, pt does not demonstrate an appropriate level of alertness for a PO diet. Pt not responding to therapy; however, will keep orders in place to check in for readiness for POs.     HPI HPI: Pt is a 62 yo female presenting to ED from [facility] 7/5 with AMS. Diagnosed with C.diff 7/3 per facility. Intubated 7/5-7/8. Most recently seen by SLP May 2024 and recommended initiation of a regular diet with thin liquids only when fully alert. PMH includes dementia, lupus, COPD, CVA, AKI, GERD, seizure disorder, recent hospitalization for sepsis and PNA      SLP Plan  Continue with current plan of care      Recommendations for follow up therapy are one component of a multi-disciplinary discharge planning process, led by the attending physician.  Recommendations may be updated based on patient status, additional functional criteria and insurance authorization.    Recommendations  Diet recommendations: NPO Medication Administration: Via alternative means                  Oral care QID   Frequent or constant Supervision/Assistance Dysphagia, unspecified (R13.10)     Continue with current plan of care     Gwynneth Aliment, M.A., CF-SLP Speech Language Pathology, Acute Rehabilitation Services  Secure Chat preferred [phone]   08/23/2022, 11:31 AM

## 2022-08-23 NOTE — TOC Transition Note (Signed)
Transition of Care [facility]) - CM/SW Discharge Note   Patient Details  Name: Brittany [name] MRN: [id number] Date of Birth: 02-02-1961  Transition of Care [facility]) CM/SW Contact:  Ralene Bathe, LCSW Phone Number: 08/23/2022, 10:31 AM   Clinical Narrative:    Patient will DC to: [facility] Anticipated DC date: 08/23/2022 Family notified: DSS guardian notified Transport by: Sharin Mons   Per MD patient ready for DC to SNF with hospice. RN to call report prior to discharge [phone] room 127B). RN,  patient's DSS guardian, hospice liaison, and facility notified of DC. Discharge Summary sent to facility.  Ambulance transport will be requested for patient.   CSW will sign off for now as social work intervention is no longer needed. Please consult [place] again if new needs arise.    Final next level of care: Long Term Nursing Home Barriers to Discharge: Barriers Resolved   Patient Goals and CMS Choice      Discharge Placement                Patient chooses bed at:  [facility]) Patient to be transferred to facility by: [facility] Name of family member notified: America Brown- Egbert Garibaldi (DSS) Patient and family notified of of transfer: 08/23/22  Discharge Plan and Services Additional resources added to the After Visit Summary for                                       Social Determinants of Health (SDOH) Interventions SDOH Screenings   Food Insecurity: No Food Insecurity (05/14/2022)  Housing: Patient Unable To Answer (05/14/2022)  Transportation Needs: No Transportation Needs (05/14/2022)  Utilities: Not At Risk (05/14/2022)  Financial Resource Strain: Low Risk  (06/12/2019)   Received from [facility]  Physical Activity: Inactive (06/12/2019)   [facility] from [facility]  Social Connections: Socially Isolated (06/12/2019)   Received from [facility]  Stress: Stress Concern Present (06/12/2019)   Received from [facility]  Tobacco Use: High Risk (08/17/2022)     Readmission Risk Interventions    08/18/2022   11:52 AM 12/29/2021    1:48 PM 09/22/2021    1:24 PM  Readmission Risk Prevention Plan  Transportation Screening Complete Complete Complete  Medication Review (RN Care Manager) Referral to Pharmacy Complete Complete  PCP or Specialist appointment within 3-5 days of discharge Not Complete  Complete  PCP/Specialist Appt Not Complete comments No estimated date of discharge at this time    HRI or Home Care Consult Not Complete Complete Complete  HRI or Home Care Consult Pt Refusal Comments Patient is from a Skilled Nursing facility    SW Recovery Care/Counseling Consult Not Complete Complete Complete  SW Consult Not Complete Comments Patient is currently intubated    Palliative Care Screening Not Applicable Not Applicable Not Applicable  Skilled Nursing Facility Complete Not Applicable Complete
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
## 2022-09-13 DEATH — deceased
